# Patient Record
Sex: Male | Born: 1950 | Race: Black or African American | Hispanic: No | Marital: Married | Smoking: Former smoker
Health system: Southern US, Community
[De-identification: ages and names within clinical notes are randomized; demographics above are authoritative.]

## PROBLEM LIST (undated history)

## (undated) DIAGNOSIS — N400 Enlarged prostate without lower urinary tract symptoms: Secondary | ICD-10-CM

## (undated) DIAGNOSIS — E785 Hyperlipidemia, unspecified: Secondary | ICD-10-CM

## (undated) DIAGNOSIS — T148XXA Other injury of unspecified body region, initial encounter: Secondary | ICD-10-CM

## (undated) DIAGNOSIS — Z9989 Dependence on other enabling machines and devices: Secondary | ICD-10-CM

## (undated) DIAGNOSIS — Z860100 Personal history of colon polyps, unspecified: Secondary | ICD-10-CM

## (undated) DIAGNOSIS — I10 Essential (primary) hypertension: Secondary | ICD-10-CM

## (undated) DIAGNOSIS — G4730 Sleep apnea, unspecified: Secondary | ICD-10-CM

## (undated) DIAGNOSIS — K5790 Diverticulosis of intestine, part unspecified, without perforation or abscess without bleeding: Secondary | ICD-10-CM

## (undated) DIAGNOSIS — R519 Headache, unspecified: Secondary | ICD-10-CM

## (undated) DIAGNOSIS — R51 Headache: Secondary | ICD-10-CM

## (undated) DIAGNOSIS — H269 Unspecified cataract: Secondary | ICD-10-CM

## (undated) DIAGNOSIS — I2510 Atherosclerotic heart disease of native coronary artery without angina pectoris: Secondary | ICD-10-CM

## (undated) DIAGNOSIS — I714 Abdominal aortic aneurysm, without rupture, unspecified: Secondary | ICD-10-CM

## (undated) DIAGNOSIS — M2540 Effusion, unspecified joint: Secondary | ICD-10-CM

## (undated) DIAGNOSIS — K219 Gastro-esophageal reflux disease without esophagitis: Secondary | ICD-10-CM

## (undated) DIAGNOSIS — Z87442 Personal history of urinary calculi: Secondary | ICD-10-CM

## (undated) DIAGNOSIS — D693 Immune thrombocytopenic purpura: Secondary | ICD-10-CM

## (undated) DIAGNOSIS — I7789 Other specified disorders of arteries and arterioles: Secondary | ICD-10-CM

## (undated) DIAGNOSIS — M1990 Unspecified osteoarthritis, unspecified site: Secondary | ICD-10-CM

## (undated) DIAGNOSIS — D649 Anemia, unspecified: Secondary | ICD-10-CM

## (undated) DIAGNOSIS — J382 Nodules of vocal cords: Secondary | ICD-10-CM

## (undated) DIAGNOSIS — H409 Unspecified glaucoma: Secondary | ICD-10-CM

## (undated) DIAGNOSIS — R351 Nocturia: Secondary | ICD-10-CM

## (undated) DIAGNOSIS — G4733 Obstructive sleep apnea (adult) (pediatric): Secondary | ICD-10-CM

## (undated) DIAGNOSIS — M2550 Pain in unspecified joint: Secondary | ICD-10-CM

## (undated) DIAGNOSIS — I6523 Occlusion and stenosis of bilateral carotid arteries: Secondary | ICD-10-CM

## (undated) DIAGNOSIS — D759 Disease of blood and blood-forming organs, unspecified: Secondary | ICD-10-CM

## (undated) HISTORY — DX: Obstructive sleep apnea (adult) (pediatric): G47.33

## (undated) HISTORY — DX: Occlusion and stenosis of bilateral carotid arteries: I65.23

## (undated) HISTORY — PX: COLONOSCOPY: SHX174

## (undated) HISTORY — PX: COLONOSCOPY WITH ESOPHAGOGASTRODUODENOSCOPY (EGD): SHX5779

## (undated) HISTORY — DX: Anemia, unspecified: D64.9

## (undated) HISTORY — PX: EYE SURGERY: SHX253

## (undated) HISTORY — DX: Immune thrombocytopenic purpura: D69.3

## (undated) HISTORY — DX: Dependence on other enabling machines and devices: Z99.89

## (undated) HISTORY — DX: Essential (primary) hypertension: I10

## (undated) HISTORY — DX: Abdominal aortic aneurysm, without rupture, unspecified: I71.40

## (undated) HISTORY — DX: Abdominal aortic aneurysm, without rupture: I71.4

---

## 2003-03-06 ENCOUNTER — Emergency Department (HOSPITAL_COMMUNITY): Admission: EM | Admit: 2003-03-06 | Discharge: 2003-03-06 | Payer: Self-pay | Admitting: Family Medicine

## 2003-05-03 ENCOUNTER — Emergency Department (HOSPITAL_COMMUNITY): Admission: EM | Admit: 2003-05-03 | Discharge: 2003-05-03 | Payer: Self-pay | Admitting: Family Medicine

## 2004-02-21 ENCOUNTER — Emergency Department (HOSPITAL_COMMUNITY): Admission: EM | Admit: 2004-02-21 | Discharge: 2004-02-21 | Payer: Self-pay | Admitting: Family Medicine

## 2004-07-20 ENCOUNTER — Emergency Department (HOSPITAL_COMMUNITY): Admission: AD | Admit: 2004-07-20 | Discharge: 2004-07-20 | Payer: Self-pay | Admitting: Family Medicine

## 2004-07-21 ENCOUNTER — Emergency Department (HOSPITAL_COMMUNITY): Admission: EM | Admit: 2004-07-21 | Discharge: 2004-07-21 | Payer: Self-pay | Admitting: Family Medicine

## 2004-07-23 ENCOUNTER — Emergency Department (HOSPITAL_COMMUNITY): Admission: EM | Admit: 2004-07-23 | Discharge: 2004-07-23 | Payer: Self-pay | Admitting: Family Medicine

## 2004-07-27 ENCOUNTER — Emergency Department (HOSPITAL_COMMUNITY): Admission: EM | Admit: 2004-07-27 | Discharge: 2004-07-27 | Payer: Self-pay | Admitting: Emergency Medicine

## 2005-06-09 ENCOUNTER — Emergency Department (HOSPITAL_COMMUNITY): Admission: EM | Admit: 2005-06-09 | Discharge: 2005-06-09 | Payer: Self-pay | Admitting: Family Medicine

## 2005-07-24 ENCOUNTER — Emergency Department (HOSPITAL_COMMUNITY): Admission: EM | Admit: 2005-07-24 | Discharge: 2005-07-24 | Payer: Self-pay | Admitting: Emergency Medicine

## 2005-12-11 ENCOUNTER — Inpatient Hospital Stay (HOSPITAL_COMMUNITY): Admission: EM | Admit: 2005-12-11 | Discharge: 2005-12-14 | Payer: Self-pay | Admitting: Family Medicine

## 2006-04-08 ENCOUNTER — Emergency Department (HOSPITAL_COMMUNITY): Admission: EM | Admit: 2006-04-08 | Discharge: 2006-04-08 | Payer: Self-pay | Admitting: Emergency Medicine

## 2008-05-29 ENCOUNTER — Emergency Department (HOSPITAL_COMMUNITY): Admission: EM | Admit: 2008-05-29 | Discharge: 2008-05-29 | Payer: Self-pay | Admitting: Family Medicine

## 2009-06-04 ENCOUNTER — Emergency Department (HOSPITAL_COMMUNITY): Admission: EM | Admit: 2009-06-04 | Discharge: 2009-06-04 | Payer: Self-pay | Admitting: Family Medicine

## 2009-06-08 ENCOUNTER — Emergency Department (HOSPITAL_COMMUNITY): Admission: EM | Admit: 2009-06-08 | Discharge: 2009-06-08 | Payer: Self-pay | Admitting: Emergency Medicine

## 2010-04-15 LAB — DIFFERENTIAL
Basophils Relative: 0 % (ref 0–1)
Eosinophils Absolute: 0 10*3/uL (ref 0.0–0.7)
Eosinophils Absolute: 0.1 10*3/uL (ref 0.0–0.7)
Eosinophils Relative: 0 % (ref 0–5)
Eosinophils Relative: 1 % (ref 0–5)
Lymphocytes Relative: 8 % — ABNORMAL LOW (ref 12–46)
Lymphs Abs: 0.9 10*3/uL (ref 0.7–4.0)
Monocytes Absolute: 0.4 10*3/uL (ref 0.1–1.0)
Monocytes Relative: 11 % (ref 3–12)
Monocytes Relative: 4 % (ref 3–12)
Neutrophils Relative %: 80 % — ABNORMAL HIGH (ref 43–77)
Neutrophils Relative %: 94 % — ABNORMAL HIGH (ref 43–77)

## 2010-04-15 LAB — CBC
HCT: 40.7 % (ref 39.0–52.0)
Hemoglobin: 14.2 g/dL (ref 13.0–17.0)
MCHC: 34.8 g/dL (ref 30.0–36.0)
MCHC: 35 g/dL (ref 30.0–36.0)
MCV: 91.7 fL (ref 78.0–100.0)
MCV: 92.5 fL (ref 78.0–100.0)
RBC: 4.4 MIL/uL (ref 4.22–5.81)
RDW: 14.5 % (ref 11.5–15.5)

## 2010-04-15 LAB — URINALYSIS, ROUTINE W REFLEX MICROSCOPIC
Glucose, UA: NEGATIVE mg/dL
Ketones, ur: NEGATIVE mg/dL
Leukocytes, UA: NEGATIVE
Nitrite: NEGATIVE
Specific Gravity, Urine: 1.01 (ref 1.005–1.030)
pH: 5.5 (ref 5.0–8.0)

## 2010-04-15 LAB — COMPREHENSIVE METABOLIC PANEL
ALT: 37 U/L (ref 0–53)
AST: 34 U/L (ref 0–37)
Calcium: 9.2 mg/dL (ref 8.4–10.5)
Creatinine, Ser: 1.25 mg/dL (ref 0.4–1.5)
GFR calc Af Amer: >60 mL/min (ref >60)
Sodium: 133 mEq/L — ABNORMAL LOW (ref 135–145)
Total Protein: 6.8 g/dL (ref 6.0–8.3)

## 2010-04-15 LAB — LIPASE, BLOOD: Lipase: 33 U/L (ref 11–59)

## 2010-04-15 LAB — POCT I-STAT, CHEM 8
BUN: 12 mg/dL (ref 6–23)
Chloride: 97 mEq/L (ref 96–112)
Creatinine, Ser: 0.8 mg/dL (ref 0.4–1.5)
Sodium: 136 mEq/L (ref 135–145)
TCO2: 29 mmol/L (ref 0–100)

## 2010-04-15 LAB — POCT URINALYSIS DIP (DEVICE)
Ketones, ur: 40 mg/dL — AB
Protein, ur: 100 mg/dL — AB
Specific Gravity, Urine: 1.02 (ref 1.005–1.030)

## 2010-04-15 LAB — URINE CULTURE: Colony Count: NO GROWTH

## 2010-04-27 ENCOUNTER — Emergency Department (HOSPITAL_COMMUNITY): Payer: BC Managed Care – PPO

## 2010-04-27 ENCOUNTER — Inpatient Hospital Stay (INDEPENDENT_AMBULATORY_CARE_PROVIDER_SITE_OTHER)
Admission: RE | Admit: 2010-04-27 | Discharge: 2010-04-27 | Disposition: A | Discharge status: Home / Self Care | Payer: BC Managed Care – PPO | Source: Ambulatory Visit | Attending: Family Medicine | Admitting: Family Medicine

## 2010-04-27 ENCOUNTER — Encounter (HOSPITAL_COMMUNITY): Payer: Self-pay | Admitting: Radiology

## 2010-04-27 ENCOUNTER — Observation Stay (HOSPITAL_COMMUNITY)
Admission: EM | Admit: 2010-04-27 | Discharge: 2010-04-28 | Disposition: A | Discharge status: Home / Self Care | Payer: BC Managed Care – PPO | Attending: Cardiology | Admitting: Cardiology

## 2010-04-27 DIAGNOSIS — K8020 Calculus of gallbladder without cholecystitis without obstruction: Secondary | ICD-10-CM | POA: Insufficient documentation

## 2010-04-27 DIAGNOSIS — K7689 Other specified diseases of liver: Secondary | ICD-10-CM | POA: Insufficient documentation

## 2010-04-27 DIAGNOSIS — I10 Essential (primary) hypertension: Secondary | ICD-10-CM | POA: Insufficient documentation

## 2010-04-27 DIAGNOSIS — R079 Chest pain, unspecified: Secondary | ICD-10-CM

## 2010-04-27 DIAGNOSIS — R112 Nausea with vomiting, unspecified: Secondary | ICD-10-CM | POA: Insufficient documentation

## 2010-04-27 DIAGNOSIS — Q619 Cystic kidney disease, unspecified: Secondary | ICD-10-CM | POA: Insufficient documentation

## 2010-04-27 DIAGNOSIS — E876 Hypokalemia: Secondary | ICD-10-CM | POA: Insufficient documentation

## 2010-04-27 DIAGNOSIS — R61 Generalized hyperhidrosis: Secondary | ICD-10-CM | POA: Insufficient documentation

## 2010-04-27 DIAGNOSIS — F17200 Nicotine dependence, unspecified, uncomplicated: Secondary | ICD-10-CM | POA: Insufficient documentation

## 2010-04-27 DIAGNOSIS — I495 Sick sinus syndrome: Secondary | ICD-10-CM

## 2010-04-27 LAB — POCT CARDIAC MARKERS: Troponin i, poc: <0.05 ng/mL (ref 0.00–0.09)

## 2010-04-27 LAB — BASIC METABOLIC PANEL
BUN: 10 mg/dL (ref 6–23)
CO2: 23 mEq/L (ref 19–32)
Chloride: 97 mEq/L (ref 96–112)
Creatinine, Ser: 0.87 mg/dL (ref 0.4–1.5)
Glucose, Bld: 126 mg/dL — ABNORMAL HIGH (ref 70–99)
Potassium: 3.3 mEq/L — ABNORMAL LOW (ref 3.5–5.1)

## 2010-04-27 LAB — DIFFERENTIAL
Basophils Relative: 0 % (ref 0–1)
Eosinophils Absolute: 0 10*3/uL (ref 0.0–0.7)
Eosinophils Relative: 0 % (ref 0–5)
Lymphs Abs: 0.8 10*3/uL (ref 0.7–4.0)
Monocytes Absolute: 0.4 10*3/uL (ref 0.1–1.0)
Monocytes Relative: 4 % (ref 3–12)
Neutrophils Relative %: 90 % — ABNORMAL HIGH (ref 43–77)

## 2010-04-27 LAB — CBC
MCH: 30.7 pg (ref 26.0–34.0)
MCHC: 34.5 g/dL (ref 30.0–36.0)
MCV: 89 fL (ref 78.0–100.0)
Platelets: 97 10*3/uL — ABNORMAL LOW (ref 150–400)
RDW: 14.1 % (ref 11.5–15.5)

## 2010-04-27 MED ORDER — IOHEXOL 300 MG/ML  SOLN
100.0000 mL | Freq: Once | INTRAMUSCULAR | Status: AC | PRN
  Administered 2010-04-27: 100 mL via INTRAVENOUS

## 2010-04-28 ENCOUNTER — Emergency Department (HOSPITAL_COMMUNITY): Payer: BC Managed Care – PPO

## 2010-04-28 LAB — POCT CARDIAC MARKERS
CKMB, poc: <1 ng/mL — ABNORMAL LOW (ref 1.0–8.0)
Myoglobin, poc: 82.1 ng/mL (ref 12–200)
Troponin i, poc: <0.05 ng/mL (ref 0.00–0.09)

## 2010-04-28 LAB — COMPREHENSIVE METABOLIC PANEL
ALT: 13 U/L (ref 0–53)
Albumin: 4.6 g/dL (ref 3.5–5.2)
Alkaline Phosphatase: 45 U/L (ref 39–117)
BUN: 9 mg/dL (ref 6–23)
Potassium: 3.1 mEq/L — ABNORMAL LOW (ref 3.5–5.1)
Sodium: 131 mEq/L — ABNORMAL LOW (ref 135–145)
Total Protein: 7.9 g/dL (ref 6.0–8.3)

## 2010-04-28 LAB — LIPID PANEL
Cholesterol: 187 mg/dL (ref 0–200)
HDL: 54 mg/dL (ref >39)
Total CHOL/HDL Ratio: 3.5 RATIO

## 2010-04-28 LAB — URINALYSIS, ROUTINE W REFLEX MICROSCOPIC
Glucose, UA: NEGATIVE mg/dL
Ketones, ur: 15 mg/dL — AB
Leukocytes, UA: NEGATIVE
pH: 7.5 (ref 5.0–8.0)

## 2010-04-28 LAB — URINE MICROSCOPIC-ADD ON

## 2010-04-28 LAB — TSH: TSH: 1.611 u[IU]/mL (ref 0.350–4.500)

## 2010-04-28 LAB — CBC
HCT: 39.3 % (ref 39.0–52.0)
Hemoglobin: 13.6 g/dL (ref 13.0–17.0)
MCH: 30.4 pg (ref 26.0–34.0)
MCV: 87.7 fL (ref 78.0–100.0)
RBC: 4.48 MIL/uL (ref 4.22–5.81)

## 2010-04-28 LAB — CARDIAC PANEL(CRET KIN+CKTOT+MB+TROPI)
CK, MB: 1.5 ng/mL (ref 0.3–4.0)
Relative Index: 0.8 (ref 0.0–2.5)
Total CK: 204 U/L (ref 7–232)
Troponin I: 0.01 ng/mL (ref 0.00–0.06)

## 2010-05-03 NOTE — Discharge Summary (Signed)
  NAME:  Lonnie Hansen, Lonnie Hansen NO.:  [id number]  MEDICAL RECORD NO.:  [id number]           PATIENT TYPE:  I  LOCATION:  2033                         FACILITY:  [facility]  PHYSICIAN:  Mali Othniel Maret, MD         DATE OF BIRTH:  22-Aug-1950  DATE OF ADMISSION:  04/27/2010 DATE OF DISCHARGE:  04/28/2010                              DISCHARGE SUMMARY   DISCHARGE DIAGNOSES: 1. Chest pain, MI ruled out. 2. Treated hypertension. 3. Hypokalemia, repleted. 4. Questionable viral gastroenteritis.  HOSPITAL COURSE:  The patient is a pleasant 60 year old male, he is a friend of Dr. Melvern Banker.  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  He has no prior history of coronary disease.  He developed some nausea and vomiting and chest pressure after eating a large meal.  He presented to the emergency room.  Please see admission history and physical for complete details. His EKG showed no acute changes.  He was admitted to telemetry for observation.  His enzymes were negative for an MI.  Abdominal ultrasound was done showing cholelithiasis and renal cysts.  There was some fatty infiltration of the liver as well.  There was no gallbladder wall thickening or edema.  CT angiogram of his chest showed no evidence of pulmonary embolism.  Chest x-ray showed no evidence of disease.  TSH 1.61.  Cholesterol 187, HDL 54, LDL 119.  Troponin's negative x3. Amylase 92, hemoglobin A1c 5.6.  Lipase was slightly elevated at 73. Sodium 131, potassium 3.1, BUN 9, creatinine 0.79.  Liver functions were normal.  White count 10.7, hemoglobin 13.6, hematocrit 39.3, platelets 115,000.  EKG shows sinus rhythm, sinus bradycardia with no acute changes.  DISPOSITION:  The patient was seen by Dr. Debara Pickett.  He feels it is possible the patient had viral gastroenteritis and mild dehydration. There was no evidence of cholecystitis.  We did replace his potassium which was low at 3.1.  Dr. Debara Pickett feels he can be discharged late on the second and  we will see him back in the office in 1-2 weeks.  We did add Protonix and gave him a prescription for nitrates.  He is not on a beta- blocker because of his baseline bradycardia.     Erlene Quan, P.A.   ______________________________ Mali Doren Kaspar, MD    LKK/MEDQ  D:  04/28/2010  T:  04/29/2010  Job:  [id number]  cc:   Mali Loretha Ure, MD  Electronically Signed by Kerin Ransom P.A. on 05/01/2010 10:15:01 AM Electronically Signed by Raliegh Ip. Gabrelle Roca M.D. on 05/03/2010 11:53:49 AM

## 2010-05-29 NOTE — H&P (Signed)
NAME:  [name], [name] NO.:  [id number]  MEDICAL RECORD NO.:  [id number]           PATIENT TYPE:  E  LOCATION:  [facility]                         FACILITY:  [facility]  PHYSICIAN:  Rolland Porter, MD DATE OF BIRTH:  April 22, 1950  DATE OF ADMISSION:  04/27/2010 DATE OF DISCHARGE:                             HISTORY & PHYSICAL   CHIEF COMPLAINT:  Chest pain.  HISTORY OF PRESENT ILLNESS:  [name] is a pleasant 60 year old African American male who presents to the emergency department tonight with complaints of substernal chest discomfort radiating to his back with associated nausea and vomiting.  His symptoms started around noontime on April 27, 2010.  He describes this substernal discomfort as a burning sensation and initially felt that it was related to reflux.  He does have reflux occasionally; however, this was more uncomfortable.  He complains of associated nausea and vomiting with this and complains of diaphoresis.  Also, he used some antacids at home with very little relief.  He was also treated with sublingual nitroglycerin in the emergency department, which relieved his pain; however, he states he was nearly pain-free prior to taking the nitro.  He denies any exertional chest discomfort.  No orthopnea or PND.  No lower extremity edema.  He denies feeling any tachycardia or palpitations and has not experienced any syncope or presyncope.  At present, he complains of nausea but denies any chest discomfort.  On arrival to the emergency department, his EKG revealed normal sinus rhythm with no acute ST-T wave changes. He was hypertensive with a blood pressure of 166/98 and his cardiac point-of-care markers were negative.  He does report feeling weak and feels like he has no energy.  He has no previous cardiac history.  PAST MEDICAL HISTORY: 1. History of GI bleed. 2. History of diverticulosis and hemorrhoids. 3. Questionable irritable bowel syndrome. 4. Migraine  headaches. 5. History of kidney stones. 6. Left renal cyst. 7. Glaucoma. 8. Tobacco use. 9. Scoliosis. 10.Hypertension. 11.Questionable lipid status.  FAMILY HISTORY:  Positive for coronary artery disease in his father.  He died of a heart attack at age 77.  SOCIAL HISTORY:  He is married.  He smokes 2-3 cigarettes a day and drinks occasionally.  He is a Therapist, nutritional.  ALLERGIES:  None known.  CURRENT MEDICATIONS: 1. Atenolol 100 mg daily. 2. Tribenzor, dosages are unknown.  REVIEW OF SYSTEMS:  As per HPI, otherwise negative.  PHYSICAL EXAMINATION:  VITAL SIGNS: Blood pressure is 160/90, pulse is 50, respirations 20, pulse ox is 95%, Temperature is 98.6. GENERAL:  This is a pleasant 60 year old white male, in no acute distress. HEENT:  Normocephalic, atraumatic.  Pupils equal, reactive to light and accommodation.  Extraocular movements intact.  Sclerae are white. NECK:  Supple.  No lymphadenopathy, thyromegaly, or bruits.  No JVD. CARDIOVASCULAR:  Regular rate and rhythm.  S1, S2 without appreciable murmur, gallop, or rub. LUNGS:  Clear to auscultation bilaterally with normal respiratory effort. ABDOMEN:  Soft.  Mild tenderness in the epigastric region.  No hepatosplenomegaly or masses. EXTREMITIES:  Radial, femoral, dorsal pedal arteries are 2+ bilaterally. There is no  lower extremity edema.  No clubbing, cyanosis, or ulcers. NEUROLOGIC:  Oriented to person, place, and time.  Normal mood and affect.  Cranial nerves II-XII are grossly intact. SKIN:  Warm and moist.  LABORATORY DATA:  Chest x-ray is negative for acute process.  BMET revealed a sodium of 129, potassium is 3.3, glucose of 126, BUN 10 and creatinine 0.87.  White blood cell count is 12.1, hemoglobin is 12.6, hematocrit 36.5 and platelets of 97,000.  Myoglobin is 67.5, CK-MB is less than 1.0.  Troponin is less than 0.05.  IMPRESSION: 1. Chest pain. 2. Vomiting. 3. Hypertension. 4. History of reflux. 5.  History of irritable bowel syndrome. 6. Diverticulosis. 7. Unknown lipid status. 8. Family history of coronary artery disease.  PLAN:  We will admit to telemetry and place on nitropaste.  We will continue to rule him out for myocardial infarction.  He is to continue with beta blockers.  We will continue with an ARB as well as Norvasc and he has a p.r.n. order for hydralazine for his blood pressure if his systolic pressure remains greater than 160.  We will check a BNP, a TSH, and a magnesium level.  We will replete K and obtain an echocardiogram in the morning.  We would like to obtain an ultrasound of the abdomen to rule out gallbladder disease given his symptoms.  He will be given Zofran for his nausea.  Further recommendations are pending.    ______________________________ Blair Dolphin, NP   ______________________________ Rolland Porter, MD    LS/MEDQ  D:  04/28/2010  T:  04/28/2010  Job:  [id number]  cc:   Dr. Terance Hart  Electronically Signed by Bari Mantis NP on 05/29/2010 05:25:14 PM Electronically Signed by Glenetta Hew MD on 05/29/2010 07:37:10 PM

## 2010-06-13 NOTE — Discharge Summary (Signed)
NAME:  Lonnie Hansen, Lonnie Hansen NO.:  [id number]   MEDICAL RECORD NO.:  [id number]          PATIENT TYPE:  INP   LOCATION:  [id number]                         FACILITY:  [facility]   PHYSICIAN:  Sherryl Manges, M.D.  DATE OF BIRTH:  March 01, 1950   DATE OF ADMISSION:  12/10/2005  DATE OF DISCHARGE:  12/14/2005                                 DISCHARGE SUMMARY   PRIMARY MEDICAL DOCTOR:  Dr. Beaulah Corin, [facility], [place],  [place] ([place] near [facility]).   DISCHARGE DIAGNOSES:  1. Lower gastrointestinal bleed/hematochezia secondary to      diverticulosis/hemorrhoids.  2. Anemia, secondary to acute on chronic gastrointestinal blood loss.  3. Acute diverticulitis.  4. Smoking history.  5. History of hypertension.  6. History of urolithiasis.  7. History of complex left renal cyst, initially diagnosed 1999.   DISCHARGE MEDICATIONS:  1. Norvasc 10 mg p.o. daily.  2. Atenolol 50 mg p.o. daily (was on 100 mg p.o. daily).  3. Protonix 40 mg p.o. daily.  4. Flagyl 500 mg p.o. t.i.d. until December 20, 2005.  5. Ciprofloxacin 500 mg p.o. b.i.d. until December 20, 2005.  6. Nu-Iron 150 mg p.o. daily.  7. Miralax OTC 17 g p.o. p.r.n. daily.   PROCEDURES:  1. Abdominal/pelvic CT scan dated December 11, 2005.  This showed complex      lesion in the left kidney which may represent a complex cyst, but      cannot be definitely characterized.  MRI recommended for further      evaluation.  Gallstone without acute cholecystitis.  Also, extensive      sigmoid diverticular disease with diverticula scattered throughout      colon. Near the splenic flexure, there is infiltration of omental fat      about the colon, consistent with diverticulitis.  No perforation or      abscess.  No pelvic lymphadenopathy or focal fluid collection.  2. MRI abdomen dated December 13, 2005.  This showed a Bosniak 63F complex      renal cystic lesion on the left, with some  thin septations as well as      some coarse calcification within it, with no appreciable enhancement of      CT scan or MRI scan.  This is most likely benign, but the coarse      calcifications are certainly atypical.  This classification of renal      mass, usually requires follow up in 3-6 months.  MRI would be most      sensitive with and without contrast to determine any enhancement of      this complex lesion.  No other significant abnormality.  Incidental      note is made of a single gallstone.  There is extensive diverticulosis      of the left side of the colon and what appears to be a hemangioma in      the T8 vertebral body.  Degenerative disk disease is seen throughout      the lower lumbar spine.   CONSULTATIONS:  Dr. Watt Climes, gastroenterologist.  ADMISSION HISTORY:  As in H&P notes of December 11, 2005, dictated by Dr.  Rexene Alberts.  However, in brief, this is a 60 year old male, with known  history of migraine headaches, possible irritable bowel syndrome,  urolithiasis, and left renal cyst, scoliosis, glaucoma, tobacco use,  diverticulosis, and hemorrhoids confirmed by lower GI endoscopy  approximately 3-4 years ago, hypertension, now presenting with a 2 day  history of recurrent hematochezia associated with back tenderness.  He was  admitted for further evaluation, investigation, and management.   CLINICAL COURSE:  1. Lower gastrointestinal bleed.  The patient presents with hematochezia      which has been recurrent over the 2 days prior to admission, with      associated lightheadedness.  Reportedly, 3-4 years ago, the patient      underwent lower GI endoscopy in [place], [place], and this      confirmed diverticulosis and hemorrhoids.  On initial evaluation,      hemoglobin was found to be 12.1, MCV 93.  He was hemodynamically stable      with a blood pressure of 142/98 and pulse of 74.  The patient was      managed initially with bowel rest, intravenous fluid  hydration, proton      pump inhibitor treatment, abdominal CT scan was ordered.  This      confirmed sigmoid diverticulosis with associated acute diverticulitis.      GI consultation was called, which was kindly provided by Dr. Watt Climes, who      recommended a conservative approach.  Serial hemoglobin levels were      done, and these remained stable, with hemoglobin ranging between 9.5-      11.4.  The patient had no further recurrences of hematochezia during      the course of this hospitalization.  GI has recommended antibiotic      treatment for the patient's diverticulitis and plans to defer lower GI      endoscopy until diverticulitis has been completely treated, after which      outpatient lower GI endoscopy will be arranged.   1. Anemia.  This is likely secondary to acute on chronic GI blood loss.      For details of presentation, refer to #1 above.  The patient's      hemoglobin remained stable at 9.5-11.4 throughout the course of his      hospitalization.  He has been commenced on iron supplements.   1. Acute diverticulitis.  For details of presentation, refer to #1 above.      GI has recommended a 10 day course of Flagyl/Ciprofloxacin and, as of      December 14, 2005, the patient was on day 4 of antibiotic treatment.      He is expected to complete this on December 20, 2005.   1. Hypertension.  The patient remained normotensive, throughout his      hospitalization, on a combination on calcium channel blocker and beta      blocker.  On December 14, 2005, his pulse rate was noted to be somewhat      low, at 59.  His atenolol has therefore been reduced to 50 mg p.o.      daily.  We expect the patient's primary MD to continue to monitor his      blood pressure and adjust antihypertensive medications as indicated.   1. Smoking history.  The patient has been counseled adequately for this.   1. History of urolithiasis.  There were no symptoms comparable to this,     throughout the  course of the patient's hospitalization.   1. Left renal cyst.  Initial abdominal imaging, i.e., with CT scan,      demonstrated a complex left renal cyst.  MRI was recommended for      further elucidation of anatomy.  On detailed discussion with the      patient, it appears that this was initially diagnosed in 1999.  The      patient was actually consulted at [facility] at the      time with regards to this lesion.  Given the elapsed time, it is likely      that this is a benign lesion; however, MRI of the abdomen was done on      December 13, 2005.  For details of findings, refer to procedure list      above.  It turns out to be Bosniak 20F complex left renal cyst with      calcification.  It is felt to be likely benign; however, follow up MRI      with and without contrast in 3-6 months is recommended.  The patient      has been reassured accordingly.  We expect the patient's primary MD to      continue to follow up on this.   DISPOSITION:  The patient was considered sufficiently clinically recovered,  stable, and asymptomatic to be discharged on December 14, 2005.   DIET:  High fiber diet.   ACTIVITY:  As tolerated.   WOUND CARE:  Not applicable.   PAIN MANAGEMENT:  Not applicable.   FOLLOW UP INSTRUCTIONS:  The patient is instructed to follow up with his  primary MD, Dr. Beaulah Corin, at [facility], [place], [place], within 1-2 weeks.  The patient has been instructed to call for an  appointment.  In addition, he is to follow up with Dr. Watt Climes, [name],  telephone number [phone] in 1 week's time, for outpatient follow up and  outpatient colonoscopy.   SPECIAL INSTRUCTIONS:  The patient is recommended to have follow up MRI of  left kidney lesion in 3-6 months at the discretion of his primary MD.      Sherryl Manges, M.D.  Electronically Signed     CO/MEDQ  D:  12/14/2005  T:  12/14/2005  Job:  [id number]   cc:   Jeryl Columbia,  M.D.  [place], [place] Beaulah Corin, MD, [facility]

## 2010-06-13 NOTE — Consult Note (Signed)
NAME:  Lonnie Hansen, Lonnie Hansen NO.:  [id number]   MEDICAL RECORD NO.:  [id number]          PATIENT TYPE:  INP   LOCATION:  4715                         FACILITY:  [facility]   PHYSICIAN:  Jeryl Columbia, M.D.    DATE OF BIRTH:  1950-10-04   DATE OF CONSULTATION:  DATE OF DISCHARGE:                                   CONSULTATION   REASON FOR CONSULTATION:  We were asked to do a consultation on Lonnie Hansen  by Dr. Thereasa Solo today for lower GI bleed.   HISTORY OF PRESENT ILLNESS:  This is a 60 year old male with a history of  diverticulitis with recent occurrences in both May and June of 2007.  He  started having black bowel movements with bright red blood yesterday, and  approximately 11 a.m.  The bowel movements continued until 6 p.m. when they  ceased, but Lonnie Hansen went ahead to the ER room.  He reports that yesterday  in the late afternoon, he developed a hot flash, then chills, then he  vomited.  He also reports he has had many years of heartburn controlled with  Prilosec over the counter.  He had his first colonoscopy 12 years ago by Dr.  Patrice Paradise who found diverticulitis.  He had a flexible sigmoidoscopy 3-4 years  ago with Dr. __________  .  He is positive for weakness, heartburn, melena,  hematochezia.  Positive for left lower quadrant tenderness.  Positive for  NSAIDs.  He said he uses two [employer] Powders per day for his arthritis.  Positive for headache, positive for hemorrhoids.   PAST MEDICAL HISTORY:  He has irritable bowel syndrome.  He fluctuates  between constipation and diarrhea.  He has arthritis, hypertension and a  history of MRSA.   REVIEW OF SYSTEMS:  Positive for headaches.  No weight loss, no recent  illness.   SOCIAL HISTORY:  Positive for tobacco.  He says he quit today.  He smokes 3-  4 cigarettes a day.  Positive for alcohol.  He used to drink heavily, but  now drinks only occasionally.  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  He  lives at home with his wife and no children.   FAMILY HISTORY:  Positive for a paternal gentleman with colon cancer, and a  cousin with Crohn's disease.   CURRENT MEDICATIONS:  Include Septra, atenolol and Norvasc.   ALLERGIES:  NO KNOWN DRUG ALLERGIES.   CURRENT LABS:  Showed hemoglobin of 10.1 which dropped from 12.1 yesterday.  Current hematocrit is 29.2 which has dropped from 35.2.  He was guaiac  positive on 11/15.   PHYSICAL EXAMINATION:  He was alert and oriented, in no apparent distress.  His cardiovascular system had a regular rate and rhythm.  His lungs are  clear to auscultation bilaterally.  His abdomen is soft, nontender,  nondistended with positive bowel sounds.  His extremities showed no edema.  He has not seen any signs of frank blood since last night, and no bowel  movements today.   ASSESSMENT/PLAN:  The patient was seen and examined by  Dr. Jeryl Columbia who  believes this is very likely a diverticular bleed in a 59 year old male with  previous recent history of diverticulitis, possibly and an upper GI bleed as  well, considering NSAIDs, heartburn and melena.  We will plan for  colonoscopy with possible endoscopy Saturday morning.      Melton Alar, PA    ______________________________  Jeryl Columbia, M.D.    MLY/MEDQ  D:  12/11/2005  T:  12/12/2005  Job:  [id number]   cc:   Jeryl Columbia, M.D.  Cherene Altes, M.D.

## 2010-06-13 NOTE — H&P (Signed)
NAME:  Lonnie Hansen, Lonnie Hansen NO.:  [id number]   MEDICAL RECORD NO.:  [id number]          PATIENT TYPE:  INP   LOCATION:  [id number]                         FACILITY:  [facility]   PHYSICIAN:  Rexene Alberts, M.D.    DATE OF BIRTH:  November 10, 1950   DATE OF ADMISSION:  12/11/2005  DATE OF DISCHARGE:                                HISTORY & PHYSICAL   PRIMARY CARE PHYSICIAN:  The patient is unassigned.   CHIEF COMPLAINT:  Multiple bloody stools.   HISTORY OF PRESENT ILLNESS:  The patient is a 60 year old man with a past  medical history significant for hypertension, diverticulosis, and  hemorrhoids, who presents to the emergency department with a chief complaint  of multiple bloody stools.  The patient states that he had a small bloody  stool 2 days ago.  This was followed by multiple bloody stools yesterday.  With 1 of his bowel movements, he says that his stool was black just once.  The other bowel movements were discolored with bright red blood.  Initially,  he had no complaints of abdominal pain; however, late last night he began to  have a small amount of crampy abdominal pain in his lower abdomen.  The pain  has been intermittent and not constant.  He has also had associated nausea  and vomiting once.  He became diaphoretic on 1 occasion.  He has had  lightheadedness for the past several hours.  He did, however, drive himself  to the [facility].  At that time, apparently a rectal exam was  performed; it was grossly bloody per his account.  The patient was advised  to  come to the emergency department at [facility].  When he  arrived, he was noted to be hemodynamically stable.  His hemoglobin is  currently 12.1.  However, given his symptomatology, he will be admitted for  further evaluation and management.   PAST MEDICAL HISTORY:  1. History of hematochezia in the past.  The patient underwent a      sigmoidoscopy or a colonoscopy approximately 3-4 years ago  by his      former physician, Dr. Elgie Congo, in [place], [place].  The patient was      told that he had diverticulosis and hemorrhoids.  2. ?History of irritable bowel syndrome.  3. Migraine headaches.  4. Kidney stone.  5. Left renal cyst.  6. History of Staph infections of the left arm and right arm in the past.      The patient is treated with Bactrim DS daily for chronic preventative      therapy.  The patient was never hospitalized for a MRSA infection.  7. Scoliosis.  8. Glaucoma.  9. Tobacco use.   MEDICATIONS:  1. Bactrim DS one tablet daily.  2. Amlodipine 10 mg daily.  3. Atenolol 100 mg daily.   ALLERGIES:  No known drug allergies.   SOCIAL HISTORY:  The patient is married.  He recently moved from [place],  [place].  He now lives in [place].  He smokes 2-3 cigarettes  per day.  He drinks alcohol only on occasion.  He denies illicit drug use.  He is a Therapist, nutritional.   FAMILY HISTORY:  His mother died of lymphoma.  His father died of a heart  attack at 46 years of age.   REVIEW OF SYSTEMS:  The patient's review of systems is otherwise negative.   PHYSICAL EXAMINATION:  VITAL SIGNS:  Temperature 97, blood pressure 142/98,  pulse 74, respiratory rate 22, oxygen saturation 100% on room air.  GENERAL:  The patient is a pleasant 60 year old [place] man who is  currently lying in bed in no acute distress.  HEENT:  Head is normocephalic and nontraumatic.  Pupils equal, round and  reactive to light.  Extraocular movements are intact.  Conjunctivae are  clear.  Sclerae are white.  Tympanic membranes are obscured by cerumen  bilaterally.  Nasal mucosa is mildly dry.  No sinus tenderness.  Oropharynx  reveals fair dentition.  Mucous membranes are dry.  No posterior exudates or  erythema.  NECK:  Supple with no adenopathy, no thyromegaly, no bruit, no JVD.  LUNGS:  Clear to auscultation bilaterally.  HEART:  S1, S2.  No murmurs, rubs, or gallops.  ABDOMEN:   Positive bowel sounds.  Mildly obese, soft, mildly tender over the  left lower quadrant.  No rebound, no guarding, no distention, no masses  palpated.  No hepatosplenomegaly.  RECTAL:  Deferred.  Per the emergency department physician, his stool was  grossly bloody and guaiac-positive.  EXTREMITIES:  Pedal pulses are 2+ bilaterally.  No pretibial edema and no  pedal edema.  NEUROLOGIC:  The patient is alert and oriented x3.  Cranial nerves II-XII  are intact.  Strength is 5/5 throughout.  Sensation is intact.   ADMISSION LABORATORY DATA:  EKG pending.   WBC 10.3, hemoglobin 12.1, MCV 93, platelets 159,000.  PT 14.5, INR 1.1, PTT  25.  Sodium 137, potassium 4.3, chloride 105, CO2 28, glucose 114, BUN 13,  creatinine 0.9, calcium 8.8, total protein 7.1, albumin 4.1, AST 24, ALT 16,  alkaline phosphatase 43, total bilirubin 0.4.   ASSESSMENT:  1. Hematochezia/probable lower gastrointestinal bleed.  The patient has a      history of hemorrhoids and diverticulosis.  More than likely, these are      the sources of his bleeding.  The patient, however, describes 1 black      stool.  The patient may have an addition of an upper gastrointestinal      source as well.  2. Anemia.  More than likely, the patient's anemia is secondary to acute      and chronic gastrointestinal bleeding.  3. Hypertension.  The patient's blood pressure is only mildly elevated.  4. Tobacco use.  5. History of a left forearm staphylococcal infection diagnosed in August      2007.  The patient was told by his primary care physician to continue      to take Bactrim DS daily for several months.  On exam, there are no      signs consistent with an active infection.   PLAN:  1. The patient will be admitted further evaluation and management.  2. He will be typed and screened for 2 units of packed red blood cells.      His hemoglobin and hematocrit will be monitored closely.  3. Volume repletion with normal saline. 4.  The patient will be allowed only sips of clear liquids and ice chips      for now.  5. We will consult the gastroenterologist.  6. The nursing staff will be asked to record all of the patient's bloody      stools.  7. We will start Protonix 40 mg q.12 h. x48 hours and then transition the      Protonix to oral.  8. We will hold Norvasc and decrease the dose of atenolol to 25 mg b.i.d.      If the patient's blood pressure becomes uncontrolled, certainly the      atenolol can be titrated back up and amlodipine can be restarted.      Rexene Alberts, M.D.  Electronically Signed     DF/MEDQ  D:  12/11/2005  T:  12/11/2005  Job:  [id number]

## 2014-05-31 ENCOUNTER — Institutional Professional Consult (permissible substitution): Payer: BLUE CROSS/BLUE SHIELD | Admitting: Neurology

## 2014-11-06 DIAGNOSIS — H2513 Age-related nuclear cataract, bilateral: Secondary | ICD-10-CM | POA: Diagnosis not present

## 2014-11-06 DIAGNOSIS — H25013 Cortical age-related cataract, bilateral: Secondary | ICD-10-CM | POA: Diagnosis not present

## 2014-11-06 DIAGNOSIS — H35033 Hypertensive retinopathy, bilateral: Secondary | ICD-10-CM | POA: Diagnosis not present

## 2014-11-06 DIAGNOSIS — H401231 Low-tension glaucoma, bilateral, mild stage: Secondary | ICD-10-CM | POA: Diagnosis not present

## 2014-12-06 ENCOUNTER — Encounter: Payer: Self-pay | Admitting: Cardiology

## 2014-12-06 DIAGNOSIS — E785 Hyperlipidemia, unspecified: Secondary | ICD-10-CM | POA: Diagnosis not present

## 2014-12-06 DIAGNOSIS — Z23 Encounter for immunization: Secondary | ICD-10-CM | POA: Diagnosis not present

## 2014-12-06 DIAGNOSIS — I10 Essential (primary) hypertension: Secondary | ICD-10-CM | POA: Diagnosis not present

## 2014-12-06 DIAGNOSIS — Z125 Encounter for screening for malignant neoplasm of prostate: Secondary | ICD-10-CM | POA: Diagnosis not present

## 2014-12-06 DIAGNOSIS — R8299 Other abnormal findings in urine: Secondary | ICD-10-CM | POA: Diagnosis not present

## 2014-12-13 DIAGNOSIS — M25551 Pain in right hip: Secondary | ICD-10-CM | POA: Diagnosis not present

## 2014-12-13 DIAGNOSIS — E785 Hyperlipidemia, unspecified: Secondary | ICD-10-CM | POA: Diagnosis not present

## 2014-12-13 DIAGNOSIS — Z6831 Body mass index (BMI) 31.0-31.9, adult: Secondary | ICD-10-CM | POA: Diagnosis not present

## 2014-12-13 DIAGNOSIS — M545 Low back pain: Secondary | ICD-10-CM | POA: Diagnosis not present

## 2014-12-13 DIAGNOSIS — Z0000 Encounter for general adult medical examination without abnormal findings: Secondary | ICD-10-CM | POA: Diagnosis not present

## 2014-12-13 DIAGNOSIS — R490 Dysphonia: Secondary | ICD-10-CM | POA: Diagnosis not present

## 2014-12-13 DIAGNOSIS — I10 Essential (primary) hypertension: Secondary | ICD-10-CM | POA: Diagnosis not present

## 2014-12-13 DIAGNOSIS — Z1389 Encounter for screening for other disorder: Secondary | ICD-10-CM | POA: Diagnosis not present

## 2014-12-13 DIAGNOSIS — R3129 Other microscopic hematuria: Secondary | ICD-10-CM | POA: Diagnosis not present

## 2014-12-18 DIAGNOSIS — Z1212 Encounter for screening for malignant neoplasm of rectum: Secondary | ICD-10-CM | POA: Diagnosis not present

## 2014-12-19 DIAGNOSIS — M545 Low back pain: Secondary | ICD-10-CM | POA: Diagnosis not present

## 2014-12-19 DIAGNOSIS — M25551 Pain in right hip: Secondary | ICD-10-CM | POA: Diagnosis not present

## 2014-12-31 DIAGNOSIS — M25551 Pain in right hip: Secondary | ICD-10-CM | POA: Diagnosis not present

## 2015-02-01 DIAGNOSIS — Z6831 Body mass index (BMI) 31.0-31.9, adult: Secondary | ICD-10-CM | POA: Diagnosis not present

## 2015-02-01 DIAGNOSIS — R5383 Other fatigue: Secondary | ICD-10-CM | POA: Diagnosis not present

## 2015-02-01 DIAGNOSIS — R05 Cough: Secondary | ICD-10-CM | POA: Diagnosis not present

## 2015-02-01 DIAGNOSIS — J019 Acute sinusitis, unspecified: Secondary | ICD-10-CM | POA: Diagnosis not present

## 2015-02-06 DIAGNOSIS — M25551 Pain in right hip: Secondary | ICD-10-CM | POA: Diagnosis not present

## 2015-02-22 DIAGNOSIS — R490 Dysphonia: Secondary | ICD-10-CM | POA: Diagnosis not present

## 2015-02-22 DIAGNOSIS — J387 Other diseases of larynx: Secondary | ICD-10-CM | POA: Diagnosis not present

## 2015-05-14 DIAGNOSIS — H524 Presbyopia: Secondary | ICD-10-CM | POA: Diagnosis not present

## 2015-05-14 DIAGNOSIS — H401231 Low-tension glaucoma, bilateral, mild stage: Secondary | ICD-10-CM | POA: Diagnosis not present

## 2015-05-14 DIAGNOSIS — H04123 Dry eye syndrome of bilateral lacrimal glands: Secondary | ICD-10-CM | POA: Diagnosis not present

## 2015-05-14 DIAGNOSIS — H1013 Acute atopic conjunctivitis, bilateral: Secondary | ICD-10-CM | POA: Diagnosis not present

## 2015-07-22 DIAGNOSIS — R05 Cough: Secondary | ICD-10-CM | POA: Diagnosis not present

## 2015-07-22 DIAGNOSIS — M25551 Pain in right hip: Secondary | ICD-10-CM | POA: Diagnosis not present

## 2015-07-22 DIAGNOSIS — G4733 Obstructive sleep apnea (adult) (pediatric): Secondary | ICD-10-CM | POA: Diagnosis not present

## 2015-07-22 DIAGNOSIS — Z6830 Body mass index (BMI) 30.0-30.9, adult: Secondary | ICD-10-CM | POA: Diagnosis not present

## 2015-07-22 DIAGNOSIS — R918 Other nonspecific abnormal finding of lung field: Secondary | ICD-10-CM | POA: Diagnosis not present

## 2015-07-22 DIAGNOSIS — I10 Essential (primary) hypertension: Secondary | ICD-10-CM | POA: Diagnosis not present

## 2015-07-22 DIAGNOSIS — Z0000 Encounter for general adult medical examination without abnormal findings: Secondary | ICD-10-CM | POA: Diagnosis not present

## 2015-07-23 ENCOUNTER — Other Ambulatory Visit: Payer: Self-pay | Admitting: Internal Medicine

## 2015-07-23 DIAGNOSIS — R918 Other nonspecific abnormal finding of lung field: Secondary | ICD-10-CM

## 2015-07-24 ENCOUNTER — Ambulatory Visit
Admission: RE | Admit: 2015-07-24 | Discharge: 2015-07-24 | Disposition: A | Discharge status: Home / Self Care | Payer: Medicare Other | Source: Ambulatory Visit | Attending: Internal Medicine | Admitting: Internal Medicine

## 2015-07-24 DIAGNOSIS — R918 Other nonspecific abnormal finding of lung field: Secondary | ICD-10-CM

## 2015-07-24 MED ORDER — IOPAMIDOL (ISOVUE-300) INJECTION 61%
75.0000 mL | Freq: Once | INTRAVENOUS | Status: AC | PRN
  Administered 2015-07-24: 75 mL via INTRAVENOUS

## 2015-08-11 DIAGNOSIS — G4733 Obstructive sleep apnea (adult) (pediatric): Secondary | ICD-10-CM | POA: Diagnosis not present

## 2015-08-13 DIAGNOSIS — G4733 Obstructive sleep apnea (adult) (pediatric): Secondary | ICD-10-CM | POA: Diagnosis not present

## 2015-08-14 ENCOUNTER — Encounter: Payer: Self-pay | Admitting: Cardiothoracic Surgery

## 2015-08-14 ENCOUNTER — Institutional Professional Consult (permissible substitution) (INDEPENDENT_AMBULATORY_CARE_PROVIDER_SITE_OTHER): Payer: Medicare Other | Admitting: Cardiothoracic Surgery

## 2015-08-14 VITALS — BP 147/83 | HR 63 | Resp 16 | Ht 69.0 in | Wt 206.0 lb

## 2015-08-14 DIAGNOSIS — I712 Thoracic aortic aneurysm, without rupture, unspecified: Secondary | ICD-10-CM

## 2015-08-14 NOTE — Patient Instructions (Addendum)
It's best to avoid activities that cause grunting or straining (medically referred to as a "valsalva maneuver"). This happens when a person bears down against a closed throat to increase the strength of arm or abdominal muscles. There's often a tendency to do this when lifting heavy weights, doing sit-ups, push-ups or chin-ups, etc., but it may be harmful.     Thoracic Aortic Aneurysm An aneurysm is a bulge in an artery. It happens when the wall of the artery is weakened or damaged. If the aneurysm gets too big, it bursts (ruptures) and severe bleeding occurs. A thoracic aortic aneurysm is an aneurysm that occurs in the first part of the aorta, between the heart and the diaphragm. The aorta is the main artery and supplies blood from the heart to the rest of the body. A thoracic aortic aneurysm can enlarge and rupture or blood can flow between the layers of the wall of the aorta through a tear (aorticdissection). Both of these conditions can cause bleeding inside the body and can be life threatening unless diagnosed and treated promptly. CAUSES  The exact cause of a thoracic aortic aneurysm is often unknown. Some contributing factors are:   A hardening of the arteries caused by the buildup of fat and other substances in the lining of a blood vessel (arteriosclerosis).  Inflammation of the walls of an artery (arteritis).  Connective tissue diseases, such as Marfan syndrome.  Injury or trauma to the aorta.  An infection, such as syphilis or staphylococcus, in the wall of the aorta (infectious aortitis) caused by bacteria. RISK FACTORS  Risk factors that contribute to a thoracic aortic aneurysm may include:  Age older than 80 years.  High blood pressure (hypertension).  Male gender.  Ethnicity (white race).  Obesity.  Family history of aneurysm (first degree relatives only).  Tobacco use. PREVENTION  The following healthy lifestyle habits may help decrease your risk of a thoracic  aortic aneurysm:  Quitting smoking. Smoking can raise your blood pressure and cause arteriosclerosis.  Limiting or avoiding alcohol.  Keeping your blood pressure, blood sugar level, and cholesterol levels within normal limits.  Decreasing your salt intake. In some people, too much salt can raise blood pressure and increase your risk of abdominal aortic aneurysm.  Eating a diet low in saturated fats and cholesterol.  Increasing your fiber intake by including whole grains, vegetables, and fruits in your diet. Eating these foods may help lower blood pressure.  Maintaining a healthy weight.  Staying physically active and exercising regularly. SYMPTOMS  The symptoms of thoracic aortic aneurysm may vary depending on the size and rate of growth of the aneurysm. Most grow slowly and do not have any symptoms. When symptoms do occur, they may include:  Pain (chest, back, sides, or abdomen). The pain may vary in intensity. A sudden onset of severe pain may indicate that the aneurysm has ruptured.  Hoarseness.  Cough.  Shortness of breath.  Swallowing problems.  Nausea or vomiting or both. DIAGNOSIS  Since most unruptured thoracic aortic aneurysms have no symptoms, they are often discovered during diagnostic exams for other conditions. An aneurysm may be found during the following procedures:  Ultrasonography (a one-time screening for thoracic aortic aneurysm by ultrasonography is also recommended for all men aged 14-75 years who have ever smoked).  X-ray exams.  A CT scan.  An MRI.  Angiography or arteriography. TREATMENT  Treatment of a thoracic aortic aneurysm depends on the size of your aneurysm, your age, and risk factors for  rupture. Medicine to control blood pressure and pain may be used to manage aneurysms smaller than 2.3 in (6 cm). Regular monitoring for enlargement may be recommended by your health care provider if:  The aneurysm is 1.2-1.5 in (3-4 cm) in size (an annual  ultrasonography may be recommended).  The aneurysm is 1.5-1.8 in (4-4.5 cm) in size (an ultrasonography every 6 months may be recommended).  The aneurysm is larger than 1.8 in (4.5 cm) in size (your health care provider may ask that you be examined by a vascular surgeon). If your aneurysm is larger than 2.2 in (5.5 cm) or if it is enlarging quickly, surgical repair may be recommended. There are two main methods for repair of an aneurysm:   Endovascular repair (a minimally invasive surgery).  Open repair. This method is used if an endovascular repair is not possible.   This information is not intended to replace advice given to you by your health care provider. Make sure you discuss any questions you have with your health care provider.   Document Released: 01/12/2005 Document Revised: 11/02/2012 Document Reviewed: 07/25/2012 Elsevier Interactive Patient Education [employer].

## 2015-08-14 NOTE — Progress Notes (Addendum)
[address]       [place] [address]             [phone]                    Shirley C Jonas [place] Medical Record [id number] Date of Birth: 11-08-1950  Referring:PATERSON,DANIEL G, MD Primary Care: [name], MD  Chief Complaint:    Chief Complaint  Patient presents with  . TAA    eval and treat...CT CHEST 07/24/15    History of Present Illness:    Lonnie Hansen 65 y.o. male is seen in the office  today for Dilated ascending aorta. The patient complains of increasing right hip pain making it difficult for him to be active. He was seeing Dr. Ninfa Linden for consideration of right hip replacement. In preparation for this a chest x-ray was performed leading to a CT scan of the chest. The patient has a previous CT scan of the chest done during admission in 2012 for syncope.  Currently he denies any cardiac symptoms denies chest pain exertional resting shortness of breath though he does note his activity is limited due to his right hip pain other than episode of syncope in 2012 he's had no repeat syncopal episodes, notes rare palpitations. He has no previous history of myocardial infarction.   His family history is significant for his father who had had a MI prior but "drop dead at home" at age 73 in 83. His mother died of lymphoma date 82 1 brother and 1 sister alive his brothers had a myocardial infarction in the past., He has no children. There is no other history can on Cerone calls her cousins of unexplained sudden death at a young age.   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       Current Activity/ Functional Status:  Patient is independent with mobility/ambulation, transfers, ADL's, IADL's.  But does use a cane because of pain in the right hip  Zubrod Score: At the time of surgery this patient's most appropriate activity status/level should be  described as: []     0    Normal activity, no symptoms [x]     1    Restricted in physical strenuous activity but ambulatory, able to do out light work []     2    Ambulatory and capable of self care, unable to do work activities, up and about               >50 % of waking hours                              []     3    Only limited self care, in bed greater than 50% of waking hours []     4    Completely disabled, no self care, confined to bed or chair []     5    Moribund   Past Medical History  Diagnosis Date  . GI bleed   . Hypertension   . HLD (hyperlipidemia)   . Anemia   . Forgetfulness   . Insomnia   . OA (osteoarthritis) of hip     History reviewed. No pertinent past surgical history.  Family History  Problem Relation Age of Onset  . Heart disease Father   .  Heart attack Father   . Thyroid disease Sister   As noted above his father died suddenly at age 61, he had a previous history of myocardial infarction   Social History   Social History  . Marital Status: Married    Spouse Name: N/A  . Number of Children: N/A  . Years of Education: N/A   Occupational History  . Not on file.   Social History Main Topics  . Smoking status: Former Smoker -- 1.00 packs/day for 25 years    Types: Cigarettes  . Smokeless tobacco: Not on file  . Alcohol Use: No  . Drug Use: No  . Sexual Activity: Not on file   Other Topics Concern  . Not on file   Social History Narrative   Married.   He is a Optometrist.       History  Smoking status  . Former Smoker -- 1.00 packs/day for 25 years  . Types: Cigarettes  Smokeless tobacco  . Not on file    History  Alcohol Use No     No Known Allergies  Current Outpatient Prescriptions  Medication Sig Dispense Refill  . amLODipine (NORVASC) 10 MG tablet Take 10 mg by mouth daily.    Marland Kitchen atenolol (TENORMIN) 25 MG tablet Take 12.5 mg by mouth daily.     . bimatoprost (LUMIGAN) 0.01 % SOLN Place 1 drop into both eyes at bedtime.      Marland Kitchen HYDROcodone-homatropine (HYCODAN) 5-1.5 MG/5ML syrup Take 5 mLs by mouth every 4 (four) hours as needed for cough.    . pantoprazole (PROTONIX) 40 MG tablet Take 40 mg by mouth daily.     No current facility-administered medications for this visit.     Review of Systems:     Cardiac Review of Systems: Y or N  Chest Pain [  n  ]  Resting SOB [  n ] Exertional SOB  [n  ]  Orthopnea [ n ]   Pedal Edema [   ]    Palpitations [  ] Syncope  [ once 2012 ]   Presyncope [ n  ]  General Review of Systems: [Y] = yes [  ]=no Constitional: recent weight change [ gain ];  Wt loss over the last 3 months [   ] anorexia [  ]; fatigue [ y ]; nausea [ n ]; night sweats [ n ]; fever [n]; or chills [  n];          Dental: poor dentition[  n]; Last Dentist visit:   Eye : blurred vision [  ]; diplopia [   ]; vision changes [  ];  Amaurosis fugax[  ]; Resp: cough [n  ];  wheezing[ n ];  hemoptysis[ n ]; shortness of breath[  ]; paroxysmal nocturnal dyspnea[  ]; dyspnea on exertion[  ]; or orthopnea[  ];  GI:  gallstones[  ], vomiting[  ];  dysphagia[  ]; melena[  ];  hematochezia [  ]; heartburn[  ];   Hx of  Colonoscopy[ y last year ]; GU: kidney stones [  ]; hematuria[  ];   dysuria [  ];  nocturia[  ];  history of     obstruction [  ]; urinary frequency [  ]             Skin: rash, swelling[  ];, hair loss[  ];  peripheral edema[  ];  or itching[  ]; Musculosketetal: myalgias[  ];  joint swelling[  ];  joint erythema[  ];  joint pain[  ];  back pain[  ];  Heme/Lymph: bruising[  ];  bleeding[  ];  anemia[  ];  Neuro: TIA[  ];  headaches[  ];  stroke[  ];  vertigo[  ];  seizures[ n ];   paresthesias[  ];  difficulty walking[ y ];  Psych:depression[  ]; anxiety[  ];  Endocrine: diabetes[ n ];  thyroid dysfunction[n ];  Immunizations: Flu up to date [ y]; Pneumococcal up to date [ n ];  Other:  Physical Exam: BP 147/83 mmHg  Pulse 63  Resp 16  Ht 5\' 9"  (1.753 m)  Wt 206 lb (93.441 kg)  BMI 30.41 kg/m2   SpO2 95%  PHYSICAL EXAMINATION: General appearance: alert, cooperative and appears older than stated age Head: Normocephalic, without obvious abnormality, atraumatic Neck: no adenopathy, no carotid bruit, no JVD, supple, symmetrical, trachea midline and thyroid not enlarged, symmetric, no tenderness/mass/nodules Lymph nodes: Cervical, supraclavicular, and axillary nodes normal. Resp: clear to auscultation bilaterally Back: symmetric, no curvature. ROM normal. No CVA tenderness. Cardio: regular rate and rhythm, S1, S2 normal, no murmur, click, rub or gallop GI: soft, non-tender; bowel sounds normal; no masses,  no organomegaly Extremities: extremities normal, atraumatic, no cyanosis or edema and Homans sign is negative, no sign of DVT Neurologic: Grossly normal Palpable dp and pt pulses bilaterial  Diagnostic Studies & Laboratory data:     Recent Radiology Findings:   Ct Chest W Contrast  07/24/2015  CLINICAL DATA:  Ectatic thoracic aorta on recent chest radiograph EXAM: CT CHEST WITH CONTRAST TECHNIQUE: Multidetector CT imaging of the chest was performed during intravenous contrast administration. CONTRAST:  21mL ISOVUE-300 IOPAMIDOL (ISOVUE-300) INJECTION 61% COMPARISON:  Most recent available chest CT April 27, 2010 FINDINGS: Mediastinum/Lymph Nodes: There is dilatation of the ascending thoracic aorta. Just beyond the aortic valve, the ascending thoracic aorta measures 6.1 x 4.9 cm, stable from prior study. At the level of the proximal right main pulmonary artery, the thoracic aorta has a maximum transverse diameter of 4.6 x 4.1 cm, essentially stable from prior study. At the level of the aortic arch, the transverse diameter is 4.6 cm, stable. There is no thoracic aortic dissection. Visualized great vessels appear somewhat tortuous but otherwise unremarkable. There is no major vessel pulmonary embolus. There are multiple foci of coronary artery calcification. There is no appreciable pericardial  thickening. Thyroid appears unremarkable. There is no appreciable thoracic adenopathy. Lungs/Pleura: There is slight scarring in the lung bases. There is no parenchymal lung edema or consolidation. Upper abdomen: In the visualized upper abdomen, there is cholelithiasis. Gallbladder wall does not appear appreciably thickened. There is a complex cyst in the upper left kidney containing calcification, unchanged compared to prior study. There is a parapelvic cyst on the right, incompletely visualized, measuring 4.2 x 3.5 cm. Musculoskeletal: There are no blastic or lytic bone lesions. IMPRESSION: Thoracic aortic prominence as noted above. Prominence is greatest just beyond the aortic valve. Question a degree of aortic valve disease. Recommend semi-annual imaging followup by CTA or MRA and referral to cardiothoracic surgery if not already obtained. This recommendation follows 2010 ACCF/AHA/AATS/ACR/ASA/SCA/SCAI/SIR/STS/SVM Guidelines for the Diagnosis and Management of Patients With Thoracic Aortic Disease. Circulation. 2010; 121: e266-e369TAA. Note that this aortic prominence is essentially stable compared to 2012 study. There is no periaortic fluid. No thoracic aortic dissection noted. No pericardial effusion. There are multiple areas of coronary artery calcification. No parenchymal lung edema or consolidation. No parenchymal lung mass or adenopathy. Cholelithiasis.  Stable cystic lesion containing multiple calcifications in the upper pole left kidney. Parapelvic cyst right kidney. Electronically Signed   By: Lowella Grip III M.D.   On: 07/24/2015 11:00    I have independently reviewed the above radiology studies  and reviewed the findings with the patient.    Recent Lab Findings: Lab Results  Component Value Date   WBC 10.7* 04/28/2010   HGB 13.6 04/28/2010   HCT 39.3 04/28/2010   PLT 115* 04/28/2010   GLUCOSE 119* 04/28/2010   CHOL  04/28/2010    187        ATP III CLASSIFICATION:  <200     mg/dL    Desirable  200-239  mg/dL   Borderline High  >=240    mg/dL   High          TRIG 69 04/28/2010   HDL 54 04/28/2010   LDLCALC * 04/28/2010    119        Total Cholesterol/HDL:CHD Risk Coronary Heart Disease Risk Table                     Men   Women  1/2 Average Risk   3.4   3.3  Average Risk       5.0   4.4  2 X Average Risk   9.6   7.1  3 X Average Risk  23.4   11.0        Use the calculated Patient Ratio above and the CHD Risk Table to determine the patient's CHD Risk.        ATP III CLASSIFICATION (LDL):  <100     mg/dL   Optimal  100-129  mg/dL   Near or Above                    Optimal  130-159  mg/dL   Borderline  160-189  mg/dL   High  >190     mg/dL   Very High   ALT 13 04/28/2010   AST 24 04/28/2010   NA 131* 04/28/2010   K 3.1* 04/28/2010   CL 92* 04/28/2010   CREATININE 0.79 04/28/2010   BUN 9 04/28/2010   CO2 24 04/28/2010   TSH 1.611 04/28/2010   HGBA1C  04/28/2010    5.6 (NOTE)                                                                       According to the ADA Clinical Practice Recommendations for 2011, when HbA1c is used as a screening test:   >=6.5%   Diagnostic of Diabetes Mellitus           (if abnormal result  is confirmed)  5.7-6.4%   Increased risk of developing Diabetes Mellitus  References:Diagnosis and Classification of Diabetes Mellitus,Diabetes [id number] 1):S62-S69 and Standards of Medical Care in         Diabetes - 2011,Diabetes Care,2011,34  (Suppl 1):S11-S61.    Aortic Size Index=      5.0   /Body surface area is 2.13 meters squared. = 2.34  < 2.75 cm/m2      4% risk per year 2.75 to 4.25          8% risk per  year > 4.25 cm/m2    20% risk per year    Assessment / Plan:   Dilated ascending aorta and aortic root, without murmur of aortic insufficiency. Likely stable since 2012 Or slightly larger. The dilatation at the aortic root makes the actual measurement difficult to make. I reviewed with the patient the radiographic  findings of dilated aortic root and descending aorta. I discussed with him the disease process and the risk of aortic dissection.  He needs 1 / diligent blood pressure control including a beta blocker , 2/ echocardiogram to evaluate aortic valve, 3/ reestablish cardiology care is previously seen Dr. Debara Pickett. 4/ gated CT scan of the aortic root.   I'll plan to see him back after the above is done to make further recommendations concerning the need for surgical repair versus continued observation, though it does appear from comparing the scan in 2012 there is been little change likely less than 1/2  cm over 5 years. He would need cardiac catheterization before any planned surgical repair.     I  spent 40 minutes counseling the patient face to face and 50% or more the  time was spent in counseling and coordination of care. The total time spent in the appointment was 60 minutes.  Grace Isaac MD      [address] [place] [address] Office [phone]   Beeper [phone]  08/14/2015 7:55 PM

## 2015-08-15 ENCOUNTER — Other Ambulatory Visit: Payer: Self-pay | Admitting: *Deleted

## 2015-08-15 DIAGNOSIS — H43393 Other vitreous opacities, bilateral: Secondary | ICD-10-CM | POA: Diagnosis not present

## 2015-08-15 DIAGNOSIS — H3563 Retinal hemorrhage, bilateral: Secondary | ICD-10-CM | POA: Diagnosis not present

## 2015-08-15 DIAGNOSIS — I351 Nonrheumatic aortic (valve) insufficiency: Secondary | ICD-10-CM

## 2015-08-15 DIAGNOSIS — I7121 Aneurysm of the ascending aorta, without rupture: Secondary | ICD-10-CM

## 2015-08-15 DIAGNOSIS — I712 Thoracic aortic aneurysm, without rupture: Secondary | ICD-10-CM

## 2015-08-15 DIAGNOSIS — H35033 Hypertensive retinopathy, bilateral: Secondary | ICD-10-CM | POA: Diagnosis not present

## 2015-08-15 DIAGNOSIS — H43812 Vitreous degeneration, left eye: Secondary | ICD-10-CM | POA: Diagnosis not present

## 2015-08-20 ENCOUNTER — Other Ambulatory Visit: Payer: Self-pay | Admitting: *Deleted

## 2015-08-20 DIAGNOSIS — I2541 Coronary artery aneurysm: Secondary | ICD-10-CM

## 2015-08-20 DIAGNOSIS — I77810 Thoracic aortic ectasia: Secondary | ICD-10-CM

## 2015-08-27 DIAGNOSIS — I6523 Occlusion and stenosis of bilateral carotid arteries: Secondary | ICD-10-CM

## 2015-08-27 HISTORY — DX: Occlusion and stenosis of bilateral carotid arteries: I65.23

## 2015-08-28 ENCOUNTER — Encounter (HOSPITAL_COMMUNITY): Payer: Self-pay

## 2015-08-28 ENCOUNTER — Ambulatory Visit (HOSPITAL_COMMUNITY)
Admission: RE | Admit: 2015-08-28 | Discharge: 2015-08-28 | Disposition: A | Discharge status: Home / Self Care | Payer: Medicare Other | Source: Ambulatory Visit | Attending: Cardiothoracic Surgery | Admitting: Cardiothoracic Surgery

## 2015-08-28 ENCOUNTER — Encounter (HOSPITAL_COMMUNITY): Payer: Medicare Other

## 2015-08-28 ENCOUNTER — Other Ambulatory Visit (HOSPITAL_COMMUNITY): Payer: Self-pay

## 2015-08-28 DIAGNOSIS — I2541 Coronary artery aneurysm: Secondary | ICD-10-CM

## 2015-08-28 DIAGNOSIS — I712 Thoracic aortic aneurysm, without rupture: Secondary | ICD-10-CM | POA: Insufficient documentation

## 2015-08-28 DIAGNOSIS — I77810 Thoracic aortic ectasia: Secondary | ICD-10-CM | POA: Diagnosis not present

## 2015-08-28 LAB — POCT I-STAT CREATININE: Creatinine, Ser: 0.9 mg/dL (ref 0.61–1.24)

## 2015-08-28 MED ORDER — IOPAMIDOL (ISOVUE-370) INJECTION 76%
INTRAVENOUS | Status: AC
  Administered 2015-08-28: 80 mL
  Filled 2015-08-28: qty 100

## 2015-09-04 ENCOUNTER — Encounter: Payer: Self-pay | Admitting: Cardiology

## 2015-09-09 DIAGNOSIS — H43393 Other vitreous opacities, bilateral: Secondary | ICD-10-CM | POA: Diagnosis not present

## 2015-09-09 DIAGNOSIS — H43812 Vitreous degeneration, left eye: Secondary | ICD-10-CM | POA: Diagnosis not present

## 2015-09-09 DIAGNOSIS — H3563 Retinal hemorrhage, bilateral: Secondary | ICD-10-CM | POA: Diagnosis not present

## 2015-09-09 DIAGNOSIS — H35033 Hypertensive retinopathy, bilateral: Secondary | ICD-10-CM | POA: Diagnosis not present

## 2015-09-13 ENCOUNTER — Telehealth: Payer: Self-pay | Admitting: Cardiology

## 2015-09-13 NOTE — Telephone Encounter (Signed)
Patient has never been seen by Dr. Radford Pax. Took patient info to scheduler to make appointment with first available physician.

## 2015-09-13 NOTE — Telephone Encounter (Signed)
Patient is now scheduled 8/23 with Dr. Martinique.

## 2015-09-13 NOTE — Telephone Encounter (Signed)
New message     Pt was sent a reschedule letter and the next available time for reschedule was in Oct 17. Pt insisted he needs an earlier appt. He is a new pt to Dr. Radford Pax. Please call.

## 2015-09-17 ENCOUNTER — Encounter: Payer: Self-pay | Admitting: Cardiology

## 2015-09-18 ENCOUNTER — Encounter (INDEPENDENT_AMBULATORY_CARE_PROVIDER_SITE_OTHER): Payer: Self-pay

## 2015-09-18 ENCOUNTER — Other Ambulatory Visit: Payer: Self-pay | Admitting: Cardiology

## 2015-09-18 ENCOUNTER — Ambulatory Visit (INDEPENDENT_AMBULATORY_CARE_PROVIDER_SITE_OTHER): Payer: Medicare Other | Admitting: Cardiology

## 2015-09-18 ENCOUNTER — Encounter: Payer: Self-pay | Admitting: Cardiology

## 2015-09-18 DIAGNOSIS — I10 Essential (primary) hypertension: Secondary | ICD-10-CM | POA: Insufficient documentation

## 2015-09-18 DIAGNOSIS — I712 Thoracic aortic aneurysm, without rupture, unspecified: Secondary | ICD-10-CM | POA: Insufficient documentation

## 2015-09-18 DIAGNOSIS — E785 Hyperlipidemia, unspecified: Secondary | ICD-10-CM | POA: Insufficient documentation

## 2015-09-18 DIAGNOSIS — H34219 Partial retinal artery occlusion, unspecified eye: Secondary | ICD-10-CM

## 2015-09-18 NOTE — Patient Instructions (Signed)
We will call with the results of your Echocardiogram  We will schedule you for carotid dopplers.

## 2015-09-18 NOTE — Progress Notes (Signed)
Cardiology Office Note    Date:  09/18/2015   ID:  Lonnie Hansen, Lonnie Hansen Aug 22, 1950, MRN [id number]  PCP:  [name], MD  Cardiologist:  Malli Falotico Martinique, MD    History of Present Illness:  Lonnie Hansen is a 65 y.o. male seen at the request of Dr. Lanelle Bal for evaluation of Dilated thoracic aorta and potential AV disease. He has a history of HLD and HTN. In 2012 he was admitted with a syncopal spell. He ruled out for MI. He was hypokalemic. He had a follow up stress test as an outpatient with Dr. Debara Pickett that was apparently OK. More recently he was planning to have hip replacement. CXR showed aortic enlargement. He then underwent CT that showed dilation of the proximal aorta. He has been seen by Dr. Servando Snare. CT of the aorta showed mild aortic root enlargement with maximal diameter 4.5 cm. The descending aorta was normal. He denies any chest pain or SOB. He has occasional pains in his left neck. He was seen by opthalmology and told he had some blood in the back of his eye and they were concerned about a vascular problem.  He is limited in activity due to hip pain. He wants to get on with his hip surgery. He has chronic glaucoma. 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 No dizziness, palpitations, or syncope since 2012. He is a Therapist, nutritional.    Past Medical History:  Diagnosis Date  . Anemia   . Forgetfulness   . GI bleed   . HLD (hyperlipidemia)   . Hypertension   . Insomnia   . OA (osteoarthritis) of hip     History reviewed. No pertinent surgical history.  Current Medications: Outpatient Medications Prior to Visit  Medication Sig Dispense Refill  . amLODipine (NORVASC) 10 MG tablet Take 10 mg by mouth daily.    . Aspirin-Acetaminophen (GOODY BODY PAIN) 500-325 MG PACK Take by mouth 2 (two) times daily as needed (for hip pain).    Marland Kitchen atenolol (TENORMIN) 25 MG tablet Take 12.5 mg by mouth daily.     .  bimatoprost (LUMIGAN) 0.01 % SOLN Place 1 drop into both eyes at bedtime.    Marland Kitchen HYDROcodone-homatropine (HYCODAN) 5-1.5 MG/5ML syrup Take 5 mLs by mouth every 4 (four) hours as needed for cough.    . pantoprazole (PROTONIX) 40 MG tablet Take 40 mg by mouth daily.     No facility-administered medications prior to visit.      Allergies:   Review of patient's allergies indicates no known allergies.   Social History   Social History  . Marital status: Married    Spouse name: N/A  . Number of children: N/A  . Years of education: N/A   Social History Main Topics  . Smoking status: Former Smoker    Packs/day: 1.00    Years: 25.00    Types: Cigarettes  . Smokeless tobacco: Never Used  . Alcohol use No  . Drug use: No  . Sexual activity: Not Asked   Other Topics Concern  . None   Social History Narrative   Married.   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     Family History:  The patient's family history includes Heart attack in his father; Heart disease in his father; Thyroid disease in his sister.   ROS:   Please see the history of present  illness.    ROS All other systems reviewed and are negative.   PHYSICAL EXAM:   VS:  BP 136/74 (BP Location: Left Arm) Comment: 128/72 Right Arm  Pulse 61   Ht 5\' 9"  (1.753 m)   Wt 202 lb (91.6 kg)   BMI 29.83 kg/m    GEN: Well nourished, well developed, in no acute distress  HEENT: normal, voice is hoarse.  Neck: no JVD, carotid bruits, or masses Cardiac: RRR; no murmurs, rubs, or gallops,no edema  Respiratory:  clear to auscultation bilaterally, normal work of breathing GI: soft, nontender, nondistended, + BS MS: no deformity or atrophy  Skin: warm and dry, no rash Neuro:  Alert and Oriented x 3, Strength and sensation are intact Psych: euthymic mood, full affect  Wt Readings from Last 3 Encounters:  09/18/15 202 lb (91.6 kg)  08/14/15 206 lb (93.4 kg)      Studies/Labs Reviewed:   EKG:  EKG is ordered today.  The  ekg ordered today demonstrates NSR with rate 61. Normal I have personally reviewed and interpreted this study.   Recent Labs: 08/28/2015: Creatinine, Ser 0.90   Lipid Panel    Component Value Date/Time   CHOL  04/28/2010 0703    187        ATP III CLASSIFICATION:  <200     mg/dL   Desirable  200-239  mg/dL   Borderline High  >=240    mg/dL   High          TRIG 69 04/28/2010 0703   HDL 54 04/28/2010 0703   CHOLHDL 3.5 04/28/2010 0703   VLDL 14 04/28/2010 0703   LDLCALC (H) 04/28/2010 0703    119        Total Cholesterol/HDL:CHD Risk Coronary Heart Disease Risk Table                     Men   Women  1/2 Average Risk   3.4   3.3  Average Risk       5.0   4.4  2 X Average Risk   9.6   7.1  3 X Average Risk  23.4   11.0        Use the calculated Patient Ratio above and the CHD Risk Table to determine the patient's CHD Risk.        ATP III CLASSIFICATION (LDL):  <100     mg/dL   Optimal  100-129  mg/dL   Near or Above                    Optimal  130-159  mg/dL   Borderline  160-189  mg/dL   High  >190     mg/dL   Very High    Additional studies/ records that were reviewed today include:  ADDENDUM REPORT: 08/28/2015 13:09  CLINICAL DATA:  Aortic Aneurysm  EXAM: Cardiac  CT  TECHNIQUE: The patient was scanned on a Philips 256 scanner. A 120 kV retrospective scan was triggered in the descending thoracic aorta at 111 HU's. Gantry rotation speed was 270 msecs and collimation was .9 mm. No beta blockade or nitro were given. The 3D data set was reconstructed in 5% intervals of the R-R cycle. Systolic and diastolic phases were analyzed on a dedicated work station using MPR, MIP and VRT modes. The patient received 80 cc of contrast.  FINDINGS: Aortic Valve:  Trileaflet without significant calcium  Aorta: Fusiform dilatation of the ascending aorta widest  at the sinus level. Bovine Arch with no coarctation. Isolated area of calcification at the isthmus with mild  aneurysmal dilatation in this area as well  Orthogonal double oblique measurements as follows  Sinotubular Junction:  40 mm  Ascending Thoracic Aorta:  42 mm  Aortic Arch:  40 mm  Isthmus:  40 mm  Descending Thoracic Aorta:  27 mm  Sinus of Valsalva Measurements:  Non-coronary:  45 mm  Right coronary: 46 mm  Left coronary: 48 mm  : 1) Trileaflet aortic valve with no significant calcification  2) Fusiform dilatation of the aortic sinuses maximum measurement in coronal view 51 mm. However anatomic short axis  Views show largest diameter of 48 mm for left coronary sinus  3) Tortuous left subclavian vein  4) Bovine Arch  5) Mild dilatation of the aortic root, arch and isthmus  6) Normal descending thoracic aorta with no coarctation  Jenkins Rouge   Electronically Signed   By: Jenkins Rouge M.D.   On: 08/28/2015 13:09   Addended by Josue Hector, MD on 08/28/2015 1:12 PM    Study Result   EXAM: OVER-READ INTERPRETATION  CT CHEST  The following report is an over-read performed by radiologist Dr. Rebekah Chesterfield [facility] on 08/28/2015. This over-read does not include interpretation of cardiac or coronary anatomy or pathology. The coronary calcium score/coronary CTA interpretation by the cardiologist is attached.  COMPARISON:  Chest CT 07/24/2015.  FINDINGS: Aortic atherosclerosis. Mild aneurysmal dilatation of the ascending thoracic aorta and the arch, both of which measure 4.5 cm in diameter. The isthmus is also aneurysmal measuring 4.3 cm in diameter. The remainder the descending thoracic aorta is otherwise normal in caliber. Aortic root dilatation (to be described separately). No evidence of thoracic aortic dissection. Within the visualized portions of the thorax there are no suspicious appearing pulmonary nodules or masses, there is no acute consolidative airspace disease, no pleural effusions, no pneumothorax  and no lymphadenopathy. Visualized portions of the upper abdomen are unremarkable. There are no aggressive appearing lytic or blastic lesions noted in the visualized portions of the skeleton.  IMPRESSION: 1. No acute incidental noncardiac findings are noted. 2. Aneurysmal dilatation of the ascending thoracic aorta, aortic arch and isthmus, as above. No thoracic aortic dissection at this time. Recommend semi-annual imaging followup by CTA or MRA. This recommendation follows 2010 ACCF/AHA/AATS/ACR/ASA/SCA/SCAI/SIR/STS/SVM Guidelines for the Diagnosis and Management of Patients With Thoracic Aortic Disease. Circulation. 2010; [id number].  Electronically Signed: By: Vinnie Langton M.D. On: 08/28/2015 10:59          ASSESSMENT:    1. Thoracic aortic aneurysm without rupture ([place])   2. Essential hypertension   3. Hyperlipidemia      PLAN:  In order of problems listed above:  1. Recommend Echo to rule out AV disease. This is scheduled for this Friday 2. Will check carotid dopplers to rule out carotid disease given concerns noted on eye exam.  If above studies are OK he is cleared to proceed with hip replacement surgery and surgery for his vocal chord growth. Continue current antihypertensive therapy. Once hip is fixed he can hopefully participate in regular aerobic activity. I will follow up in one year.    Medication Adjustments/Labs and Tests Ordered: Current medicines are reviewed at length with the patient today.  Concerns regarding medicines are outlined above.  Medication changes, Labs and Tests ordered today are listed in the Patient Instructions below. Patient Instructions  We will call with the results of  your Echocardiogram  We will schedule you for carotid dopplers.      Signed, Lonnie Vandevelde Martinique, MD  09/18/2015 3:17 PM    [facility] [address], [place], [place], [address] [phone]

## 2015-09-20 ENCOUNTER — Ambulatory Visit: Payer: Medicare Other | Admitting: Cardiology

## 2015-09-20 ENCOUNTER — Ambulatory Visit (HOSPITAL_COMMUNITY): Payer: Medicare Other | Attending: Cardiology

## 2015-09-20 ENCOUNTER — Other Ambulatory Visit: Payer: Self-pay

## 2015-09-20 DIAGNOSIS — I351 Nonrheumatic aortic (valve) insufficiency: Secondary | ICD-10-CM | POA: Diagnosis not present

## 2015-09-20 DIAGNOSIS — I77810 Thoracic aortic ectasia: Secondary | ICD-10-CM | POA: Insufficient documentation

## 2015-09-20 DIAGNOSIS — E785 Hyperlipidemia, unspecified: Secondary | ICD-10-CM | POA: Diagnosis not present

## 2015-09-20 DIAGNOSIS — I712 Thoracic aortic aneurysm, without rupture: Secondary | ICD-10-CM | POA: Diagnosis not present

## 2015-09-20 DIAGNOSIS — I119 Hypertensive heart disease without heart failure: Secondary | ICD-10-CM | POA: Insufficient documentation

## 2015-09-20 DIAGNOSIS — I7121 Aneurysm of the ascending aorta, without rupture: Secondary | ICD-10-CM

## 2015-09-23 ENCOUNTER — Encounter: Payer: Self-pay | Admitting: Cardiology

## 2015-09-23 ENCOUNTER — Ambulatory Visit (HOSPITAL_COMMUNITY)
Admission: RE | Admit: 2015-09-23 | Discharge: 2015-09-23 | Disposition: A | Discharge status: Home / Self Care | Payer: Medicare Other | Source: Ambulatory Visit | Attending: Cardiology | Admitting: Cardiology

## 2015-09-23 ENCOUNTER — Encounter (INDEPENDENT_AMBULATORY_CARE_PROVIDER_SITE_OTHER): Payer: Self-pay

## 2015-09-23 DIAGNOSIS — I10 Essential (primary) hypertension: Secondary | ICD-10-CM | POA: Insufficient documentation

## 2015-09-23 DIAGNOSIS — I6523 Occlusion and stenosis of bilateral carotid arteries: Secondary | ICD-10-CM | POA: Diagnosis not present

## 2015-09-23 DIAGNOSIS — H3589 Other specified retinal disorders: Secondary | ICD-10-CM | POA: Diagnosis not present

## 2015-09-23 DIAGNOSIS — R0989 Other specified symptoms and signs involving the circulatory and respiratory systems: Secondary | ICD-10-CM | POA: Diagnosis present

## 2015-09-23 DIAGNOSIS — Z87891 Personal history of nicotine dependence: Secondary | ICD-10-CM | POA: Diagnosis not present

## 2015-09-23 DIAGNOSIS — H34219 Partial retinal artery occlusion, unspecified eye: Secondary | ICD-10-CM

## 2015-09-23 DIAGNOSIS — E785 Hyperlipidemia, unspecified: Secondary | ICD-10-CM | POA: Insufficient documentation

## 2015-09-26 ENCOUNTER — Encounter: Payer: Self-pay | Admitting: Cardiothoracic Surgery

## 2015-09-26 ENCOUNTER — Ambulatory Visit: Payer: Medicare Other | Admitting: Interventional Cardiology

## 2015-09-26 ENCOUNTER — Ambulatory Visit (INDEPENDENT_AMBULATORY_CARE_PROVIDER_SITE_OTHER): Payer: Medicare Other | Admitting: Cardiothoracic Surgery

## 2015-09-26 VITALS — BP 152/85 | HR 66 | Resp 16 | Ht 69.0 in | Wt 200.0 lb

## 2015-09-26 DIAGNOSIS — I712 Thoracic aortic aneurysm, without rupture, unspecified: Secondary | ICD-10-CM

## 2015-09-26 NOTE — Progress Notes (Signed)
[address]       [name] [address]             [phone]                    [name]  Medical Record [id number] Date of Birth: 1950/02/07  Referring:PATERSON,DANIEL G, MD Primary Care: [name], MD  Chief Complaint:    Chief Complaint  Patient presents with  . Thoracic Aortic Aneurysm    further discuss surgery, review Cardiac Clearance, ECHO 09/20/15 and Cardiac CT 08/28/15     History of Present Illness:    Lonnie Hansen 65 y.o. male is seen in the office  today for Dilated ascending aorta. The patient complains of increasing right hip pain making it difficult for him to be active. He was seeing Dr. Ninfa Linden for consideration of right hip replacement. In preparation for this a chest x-ray was performed leading to a CT scan of the chest. The patient has a previous CT scan of the chest done during admission in 2012 for syncope.  Currently he denies any cardiac symptoms denies chest pain exertional resting shortness of breath though he does note his activity is limited due to his right hip pain other than episode of syncope in 2012 he's had no repeat syncopal episodes, notes rare palpitations. He has no previous history of myocardial infarction.   His family history is significant for his father who had had a MI prior but "drop dead at home" at age 71 in 36. His mother died of lymphoma date 15 1 brother and 1 sister alive his brothers had a myocardial infarction in the past., He has no children. There is no other history can on Cerone calls her cousins of unexplained sudden death at a young age.   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       Current Activity/ Functional Status:  Patient is independent with mobility/ambulation, transfers, ADL's, IADL's.  But does use a cane because of pain in the right hip  Zubrod Score: At the time of  surgery this patient's most appropriate activity status/level should be described as: []     0    Normal activity, no symptoms [x]     1    Restricted in physical strenuous activity but ambulatory, able to do out light work []     2    Ambulatory and capable of self care, unable to do work activities, up and about               >50 % of waking hours                              []     3    Only limited self care, in bed greater than 50% of waking hours []     4    Completely disabled, no self care, confined to bed or chair []     5    Moribund   Past Medical History:  Diagnosis Date  . Anemia   . Asymptomatic bilateral carotid artery stenosis 08/2015   1-39%   . Forgetfulness   . GI bleed   . HLD (hyperlipidemia)   . Hypertension   . Insomnia   . OA (osteoarthritis) of hip     No  past surgical history on file.  Family History  Problem Relation Age of Onset  . Heart disease Father   . Heart attack Father   . Thyroid disease Sister   As noted above his father died suddenly at age 47, he had a previous history of myocardial infarction   Social History   Social History  . Marital Status: Married    Spouse Name: N/A  . Number of Children: N/A  . Years of Education: N/A   Occupational History  . Not on file.   Social History Main Topics  . Smoking status: Former Smoker -- 1.00 packs/day for 25 years    Types: Cigarettes  . Smokeless tobacco: Not on file  . Alcohol Use: No  . Drug Use: No  . Sexual Activity: Not on file   Other Topics Concern  . Not on file   Social History Narrative   Married.   He is a Optometrist.       History  Smoking Status  . Former Smoker  . Packs/day: 1.00  . Years: 25.00  . Types: Cigarettes  Smokeless Tobacco  . Never Used    History  Alcohol Use No     No Known Allergies  Current Outpatient Prescriptions  Medication Sig Dispense Refill  . amLODipine (NORVASC) 10 MG tablet Take 10 mg by mouth daily.    .  Aspirin-Acetaminophen (GOODY BODY PAIN) 500-325 MG PACK Take by mouth 2 (two) times daily as needed (for hip pain).    Marland Kitchen atenolol (TENORMIN) 25 MG tablet Take 12.5 mg by mouth daily.     . bimatoprost (LUMIGAN) 0.01 % SOLN Place 1 drop into both eyes at bedtime.    Marland Kitchen HYDROcodone-homatropine (HYCODAN) 5-1.5 MG/5ML syrup Take 5 mLs by mouth every 4 (four) hours as needed for cough.    . pantoprazole (PROTONIX) 40 MG tablet Take 40 mg by mouth daily.     No current facility-administered medications for this visit.      Review of Systems:     Cardiac Review of Systems: Y or N  Chest Pain [  n  ]  Resting SOB [  n ] Exertional SOB  [n  ]  Orthopnea [ n ]   Pedal Edema [   ]    Palpitations [  ] Syncope  [ once 2012 ]   Presyncope [ n  ]  General Review of Systems: [Y] = yes [  ]=no Constitional: recent weight change [ gain ];  Wt loss over the last 3 months [   ] anorexia [  ]; fatigue [ y ]; nausea [ n ]; night sweats [ n ]; fever [n]; or chills [  n];          Dental: poor dentition[  n]; Last Dentist visit:   Eye : blurred vision [  ]; diplopia [   ]; vision changes [  ];  Amaurosis fugax[  ]; Resp: cough [n  ];  wheezing[ n ];  hemoptysis[ n ]; shortness of breath[  ]; paroxysmal nocturnal dyspnea[  ]; dyspnea on exertion[  ]; or orthopnea[  ];  GI:  gallstones[  ], vomiting[  ];  dysphagia[  ]; melena[  ];  hematochezia [  ]; heartburn[  ];   Hx of  Colonoscopy[ y last year ]; GU: kidney stones [  ]; hematuria[  ];   dysuria [  ];  nocturia[  ];  history of     obstruction [  ];  urinary frequency [  ]             Skin: rash, swelling[  ];, hair loss[  ];  peripheral edema[  ];  or itching[  ]; Musculosketetal: myalgias[  ];  joint swelling[  ];  joint erythema[  ];  joint pain[  ];  back pain[  ];  Heme/Lymph: bruising[  ];  bleeding[  ];  anemia[  ];  Neuro: TIA[  ];  headaches[  ];  stroke[  ];  vertigo[  ];  seizures[ n ];   paresthesias[  ];  difficulty walking[ y ];  Psych:depression[   ]; anxiety[  ];  Endocrine: diabetes[ n ];  thyroid dysfunction[n ];  Immunizations: Flu up to date [ y]; Pneumococcal up to date [ n ];  Other:  Physical Exam: BP (!) 152/85   Pulse 66   Resp 16   Ht 5\' 9"  (1.753 m)   Wt 200 lb (90.7 kg)   SpO2 99% Comment: ON RA  BMI 29.53 kg/m   PHYSICAL EXAMINATION: General appearance: alert, cooperative and appears older than stated age Head: Normocephalic, without obvious abnormality, atraumatic Neck: no adenopathy, no carotid bruit, no JVD, supple, symmetrical, trachea midline and thyroid not enlarged, symmetric, no tenderness/mass/nodules Lymph nodes: Cervical, supraclavicular, and axillary nodes normal. Resp: clear to auscultation bilaterally Back: symmetric, no curvature. ROM normal. No CVA tenderness. Cardio: regular rate and rhythm, S1, S2 normal, no murmur, click, rub or gallop GI: soft, non-tender; bowel sounds normal; no masses,  no organomegaly Extremities: extremities normal, atraumatic, no cyanosis or edema and Homans sign is negative, no sign of DVT Neurologic: Grossly normal Palpable dp and pt pulses bilaterial  Diagnostic Studies & Laboratory data:     Recent Radiology Findings:  Ct Coronary Morph W/cta Cor W/score W/ca W/cm &/or Wo/cm  Addendum Date: 08/28/2015   ADDENDUM REPORT: 08/28/2015 13:09 CLINICAL DATA:  Aortic Aneurysm EXAM: Cardiac  CT TECHNIQUE: The patient was scanned on a Philips 256 scanner. A 120 kV retrospective scan was triggered in the descending thoracic aorta at 111 HU's. Gantry rotation speed was 270 msecs and collimation was .9 mm. No beta blockade or nitro were given. The 3D data set was reconstructed in 5% intervals of the R-R cycle. Systolic and diastolic phases were analyzed on a dedicated work station using MPR, MIP and VRT modes. The patient received 80 cc of contrast. FINDINGS: Aortic Valve:  Trileaflet without significant calcium Aorta: Fusiform dilatation of the ascending aorta widest at the sinus  level. Bovine Arch with no coarctation. Isolated area of calcification at the isthmus with mild aneurysmal dilatation in this area as well Orthogonal double oblique measurements as follows Sinotubular Junction:  40 mm Ascending Thoracic Aorta:  42 mm Aortic Arch:  40 mm Isthmus:  40 mm Descending Thoracic Aorta:  27 mm Sinus of Valsalva Measurements: Non-coronary:  45 mm Right coronary: 46 mm Left coronary: 48 mm : 1) Trileaflet aortic valve with no significant calcification 2) Fusiform dilatation of the aortic sinuses maximum measurement in coronal view 51 mm. However anatomic short axis Views show largest diameter of 48 mm for left coronary sinus 3) Tortuous left subclavian vein 4) Bovine Arch 5) Mild dilatation of the aortic root, arch and isthmus 6) Normal descending thoracic aorta with no coarctation [place] Rouge Electronically Signed   By: Jenkins Rouge M.D.   On: 08/28/2015 13:09   Result Date: 08/28/2015 EXAM: OVER-READ INTERPRETATION  CT CHEST The following report is an over-read performed  by radiologist Dr. Rebekah Chesterfield [facility] on 08/28/2015. This over-read does not include interpretation of cardiac or coronary anatomy or pathology. The coronary calcium score/coronary CTA interpretation by the cardiologist is attached. COMPARISON:  Chest CT 07/24/2015. FINDINGS: Aortic atherosclerosis. Mild aneurysmal dilatation of the ascending thoracic aorta and the arch, both of which measure 4.5 cm in diameter. The isthmus is also aneurysmal measuring 4.3 cm in diameter. The remainder the descending thoracic aorta is otherwise normal in caliber. Aortic root dilatation (to be described separately). No evidence of thoracic aortic dissection. Within the visualized portions of the thorax there are no suspicious appearing pulmonary nodules or masses, there is no acute consolidative airspace disease, no pleural effusions, no pneumothorax and no lymphadenopathy. Visualized portions of the upper abdomen  are unremarkable. There are no aggressive appearing lytic or blastic lesions noted in the visualized portions of the skeleton. IMPRESSION: 1. No acute incidental noncardiac findings are noted. 2. Aneurysmal dilatation of the ascending thoracic aorta, aortic arch and isthmus, as above. No thoracic aortic dissection at this time. Recommend semi-annual imaging followup by CTA or MRA. This recommendation follows 2010 ACCF/AHA/AATS/ACR/ASA/SCA/SCAI/SIR/STS/SVM Guidelines for the Diagnosis and Management of Patients With Thoracic Aortic Disease. Circulation. 2010; 121SP:1689793. Electronically Signed: By: Vinnie Langton M.D. On: 08/28/2015 10:59    Ct Chest W Contrast  07/24/2015  CLINICAL DATA:  Ectatic thoracic aorta on recent chest radiograph EXAM: CT CHEST WITH CONTRAST TECHNIQUE: Multidetector CT imaging of the chest was performed during intravenous contrast administration. CONTRAST:  67mL ISOVUE-300 IOPAMIDOL (ISOVUE-300) INJECTION 61% COMPARISON:  Most recent available chest CT April 27, 2010 FINDINGS: Mediastinum/Lymph Nodes: There is dilatation of the ascending thoracic aorta. Just beyond the aortic valve, the ascending thoracic aorta measures 6.1 x 4.9 cm, stable from prior study. At the level of the proximal right main pulmonary artery, the thoracic aorta has a maximum transverse diameter of 4.6 x 4.1 cm, essentially stable from prior study. At the level of the aortic arch, the transverse diameter is 4.6 cm, stable. There is no thoracic aortic dissection. Visualized great vessels appear somewhat tortuous but otherwise unremarkable. There is no major vessel pulmonary embolus. There are multiple foci of coronary artery calcification. There is no appreciable pericardial thickening. Thyroid appears unremarkable. There is no appreciable thoracic adenopathy. Lungs/Pleura: There is slight scarring in the lung bases. There is no parenchymal lung edema or consolidation. Upper abdomen: In the visualized upper  abdomen, there is cholelithiasis. Gallbladder wall does not appear appreciably thickened. There is a complex cyst in the upper left kidney containing calcification, unchanged compared to prior study. There is a parapelvic cyst on the right, incompletely visualized, measuring 4.2 x 3.5 cm. Musculoskeletal: There are no blastic or lytic bone lesions. IMPRESSION: Thoracic aortic prominence as noted above. Prominence is greatest just beyond the aortic valve. Question a degree of aortic valve disease. Recommend semi-annual imaging followup by CTA or MRA and referral to cardiothoracic surgery if not already obtained. This recommendation follows 2010 ACCF/AHA/AATS/ACR/ASA/SCA/SCAI/SIR/STS/SVM Guidelines for the Diagnosis and Management of Patients With Thoracic Aortic Disease. Circulation. 2010; 121: [id number]. Note that this aortic prominence is essentially stable compared to 2012 study. There is no periaortic fluid. No thoracic aortic dissection noted. No pericardial effusion. There are multiple areas of coronary artery calcification. No parenchymal lung edema or consolidation. No parenchymal lung mass or adenopathy. Cholelithiasis. Stable cystic lesion containing multiple calcifications in the upper pole left kidney. Parapelvic cyst right kidney. Electronically Signed   By: Lowella Grip III  M.D.   On: 07/24/2015 11:00    I have independently reviewed the above radiology studies  and reviewed the findings with the patient.  ECHO: Echocardiography  Patient:    Kason, Brashear MR #:       [id number] Study Date: 09/20/2015 Gender:     M Age:        31 Height:     175.3 cm Weight:     91.6 kg BSA:        2.14 m^2 Pt. Status: Room:   [place] MD  [place] MD  REFERRING    [name] [id number]  ATTENDING    Candee Furbish, M.D.  SONOGRAPHER  Cindy Hazy, RDCS  PERFORMING   [facility],  Outpatient  cc:  ------------------------------------------------------------------- LV EF: 60% -   65%  ------------------------------------------------------------------- Indications:      I35.1 Aortic Insufficiency.  I71.2 Ascending Aortic Aneurysm.  ------------------------------------------------------------------- History:   PMH:  Acquired from the patient and from the patient&'s chart.  PMH:  Dilated Thoracic Aorta.  Risk factors:  Hypertension. Dyslipidemia.  ------------------------------------------------------------------- Study Conclusions  - Left ventricle: The cavity size was normal. There was moderate   focal basal hypertrophy of the septum. Systolic function was   normal. The estimated ejection fraction was in the range of 60%   to 65%. Wall motion was normal; there were no regional wall   motion abnormalities. Doppler parameters are consistent with   abnormal left ventricular relaxation (grade 1 diastolic   dysfunction). - Aortic valve: There was moderate to severe regurgitation. - Aorta: Aortic root dimension: 43 mm (ED). - Ascending aorta: The ascending aorta was mildly dilated. - Right ventricle: The cavity size was mildly dilated. Wall   thickness was normal.  ------------------------------------------------------------------- Study data:   Study status:  Routine.  Procedure:  The patient reported no pain pre or post test. Transthoracic echocardiography for left ventricular function evaluation, for right ventricular function evaluation, and for assessment of valvular function. Image quality was adequate.  Study completion:  There were no complications.          Echocardiography.  M-mode, complete 2D, spectral Doppler, and color Doppler.  Birthdate:  Patient birthdate: [birth year].  Age:  Patient is 65 yr old.  Sex:  Gender: male.    BMI: 29.8 kg/m^2.  Blood pressure:     136/74  Patient status:  Outpatient.  Study date:  Study date: 09/20/2015.  Study time: 10:58 AM.  Location:  [place] [facility]  -------------------------------------------------------------------  ------------------------------------------------------------------- Left ventricle:  The cavity size was normal. There was moderate focal basal hypertrophy of the septum. Systolic function was normal. The estimated ejection fraction was in the range of 60% to 65%. Wall motion was normal; there were no regional wall motion abnormalities. Doppler parameters are consistent with abnormal left ventricular relaxation (grade 1 diastolic dysfunction).  ------------------------------------------------------------------- Aortic valve:   Trileaflet; normal thickness leaflets. Mobility was not restricted.  Doppler:  Transvalvular velocity was within the normal range. There was no stenosis. There was moderate to severe regurgitation.  ------------------------------------------------------------------- Aorta:  Ascending aorta: The ascending aorta was mildly dilated.  ------------------------------------------------------------------- Mitral valve:   Structurally normal valve.   Mobility was not restricted.  Doppler:  Transvalvular velocity was within the normal range. There was no evidence for stenosis. There was no regurgitation.  ------------------------------------------------------------------- Left atrium:  The atrium was normal in size.  ------------------------------------------------------------------- Right ventricle:  The cavity size was mildly dilated. Wall  thickness was normal. Systolic function was normal.  ------------------------------------------------------------------- Pulmonic valve:    Doppler:  Transvalvular velocity was within the normal range. There was no evidence for stenosis.  ------------------------------------------------------------------- Tricuspid valve:   Structurally normal valve.    Doppler: Transvalvular velocity was within  the normal range. There was trivial regurgitation.  ------------------------------------------------------------------- Pulmonary artery:   The main pulmonary artery was normal-sized. Systolic pressure was within the normal range.  ------------------------------------------------------------------- Right atrium:  The atrium was normal in size.  ------------------------------------------------------------------- Pericardium:  There was no pericardial effusion.  ------------------------------------------------------------------- Systemic veins: Inferior vena cava: The vessel was normal in size.  ------------------------------------------------------------------- Measurements   Left ventricle                           Value        Reference  LV ID, ED, PLAX chordal                  50.9  mm     43 - 52  LV ID, ES, PLAX chordal                  34.6  mm     23 - 38  LV fx shortening, PLAX chordal           32    %      >=29  LV PW thickness, ED                      10.9  mm     ---------  IVS/LV PW ratio, ED              (H)     1.39         <=1.3  Stroke volume, 2D                        124   ml     ---------  Stroke volume/bsa, 2D                    58    ml/m^2 ---------  LV e&', lateral                           5.26  cm/s   ---------  LV E/e&', lateral                         7.7          ---------  LV e&', medial                            5.55  cm/s   ---------  LV E/e&', medial                          7.3          ---------  LV e&', average                           5.41  cm/s   ---------  LV E/e&', average                         7.49         ---------  Ventricular septum                       Value        Reference  IVS thickness, ED                        15.2  mm     ---------    LVOT                                     Value        Reference  LVOT ID, S                               30    mm     ---------  LVOT area                                7.07  cm^2    ---------  LVOT ID                                  30    mm     ---------  LVOT peak velocity, S                    76.7  cm/s   ---------  LVOT mean velocity, S                    53.7  cm/s   ---------  LVOT VTI, S                              17.5  cm     ---------  LVOT peak gradient, S                    2     mm Hg  ---------  Stroke volume (SV), LVOT DP              123.7 ml     ---------  Stroke index (SV/bsa), LVOT DP           57.9  ml/m^2 ---------    Aortic valve                             Value        Reference  Aortic regurg pressure half-time         916   ms     ---------    Aorta                                    Value        Reference  Aortic root ID, ED                       43    mm     ---------    Left atrium  Value        Reference  LA ID, A-P, ES                           33    mm     ---------  LA ID/bsa, A-P                           1.54  cm/m^2 <=2.2  LA volume, S                             37    ml     ---------  LA volume/bsa, S                         17.3  ml/m^2 ---------  LA volume, ES, 1-p A4C                   31    ml     ---------  LA volume/bsa, ES, 1-p A4C               14.5  ml/m^2 ---------  LA volume, ES, 1-p A2C                   44    ml     ---------  LA volume/bsa, ES, 1-p A2C               20.6  ml/m^2 ---------    Mitral valve                             Value        Reference  Mitral E-wave peak velocity              40.5  cm/s   ---------  Mitral A-wave peak velocity              57.8  cm/s   ---------  Mitral deceleration time         (H)     257   ms     150 - 230  Mitral E/A ratio, peak                   0.7          ---------    Tricuspid valve                          Value        Reference  Tricuspid regurg peak velocity           251   cm/s   ---------  Tricuspid peak RV-RA gradient            25    mm Hg  ---------    Right ventricle                          Value        Reference  RV s&',  lateral, S                        18    cm/s   ---------  Legend: (L)  and  (H)  mark values outside specified reference  range.  ------------------------------------------------------------------- Prepared and Electronically Authenticated by  Candee Furbish, M.D. 2017-08-25T13:27:13  Recent Lab Findings:    Aortic Size Index=      5.0   /Body surface area is 2.1 meters squared. = 2.34  < 2.75 cm/m2      4% risk per year 2.75 to 4.25          8% risk per year > 4.25 cm/m2    20% risk per year    Assessment / Plan:   Dilated ascending aorta and aortic root, without murmur of aortic insufficiency. Likely stable since 2012 Or slightly larger. Follow-up dated CT of the aortic root results are noted above , we'll plan to see the patient back in 6 months with a follow-up CT scan.  He will recontact cardiology concerning his echo results and follow-up on blood pressure control  1 / diligent blood pressure control including a beta blocker ,  2/ echocardiogram done t to evaluate aortic valve,  3/ reestablished  cardiology care seen by Dr Lonia Blood - ECHO done  4/ gated CT scan of the aortic root. - done see above   There was moderate to severe regurgitation On echo  EF 60- 65%   LV ID, ED, PLAX chordal                  50.9  mm     43 - 52  LV ID, ES, PLAX chordal                  34.6  mm     23 - 38     I  Grace Isaac MD      [address] [place] [address] Office [phone]   Beeper [phone]  09/26/2015 10:56 AM

## 2015-09-27 ENCOUNTER — Telehealth: Payer: Self-pay

## 2015-09-27 NOTE — Telephone Encounter (Signed)
Patient called no answer.[facility] for echo results.

## 2015-10-01 ENCOUNTER — Telehealth: Payer: Self-pay

## 2015-10-01 NOTE — Telephone Encounter (Signed)
Spoke to patient echo results given.Dr.Jordan advised needs to follow up in 6 months.He stated Dr.Gerhardt told him to keep B/P under control.B/P at his office visit elevated.B/P check appointment scheduled 10/22/15 10:00 am at [facility] office.Advised to check B/P daily and bring diary of B/P's to appointment.

## 2015-10-22 ENCOUNTER — Ambulatory Visit (INDEPENDENT_AMBULATORY_CARE_PROVIDER_SITE_OTHER): Payer: Medicare Other | Admitting: *Deleted

## 2015-10-22 VITALS — BP 140/72 | HR 60

## 2015-10-22 DIAGNOSIS — I10 Essential (primary) hypertension: Secondary | ICD-10-CM

## 2015-10-22 NOTE — Progress Notes (Signed)
Called pt w Dr. Doug Sou advisements and let him know he may call if any further concerns or questions. Pt voiced understanding and thanks.

## 2015-10-22 NOTE — Progress Notes (Signed)
Met with patient for nurse BP check. He reports he is taking meds as instructed, though he usually takes his morning meds but now and did not do so. (reports he has taken amlodipine today, but not atenolol).  He states he takes atenolol 12.38m daily and sometimes takes a quarter tablet at night because it helps him sleep.  BPs taken today were done after I checked patient in and had in sitting position at rest for several minutes. He expressed no concern regarding BPs, though he notes he is not consistently checking at home. [facility] We discussed keeping a consistent log of BPs at home and that this was part of the purpose for visit.  Suggested no changes for now, he is aware I will route to Dr. JMartiniqueto review and make any adjustments or continue as is.

## 2015-10-22 NOTE — Patient Instructions (Signed)
Continue medications as prescribed. I will route a message to your cardiologist to discuss any changes that would be recommended.

## 2015-10-25 ENCOUNTER — Other Ambulatory Visit (INDEPENDENT_AMBULATORY_CARE_PROVIDER_SITE_OTHER): Payer: Medicare Other

## 2015-10-25 DIAGNOSIS — I10 Essential (primary) hypertension: Secondary | ICD-10-CM

## 2015-10-25 DIAGNOSIS — I712 Thoracic aortic aneurysm, without rupture, unspecified: Secondary | ICD-10-CM

## 2015-12-11 DIAGNOSIS — E784 Other hyperlipidemia: Secondary | ICD-10-CM | POA: Diagnosis not present

## 2015-12-11 DIAGNOSIS — I10 Essential (primary) hypertension: Secondary | ICD-10-CM | POA: Diagnosis not present

## 2015-12-11 DIAGNOSIS — R8299 Other abnormal findings in urine: Secondary | ICD-10-CM | POA: Diagnosis not present

## 2015-12-11 DIAGNOSIS — Z23 Encounter for immunization: Secondary | ICD-10-CM | POA: Diagnosis not present

## 2015-12-11 DIAGNOSIS — Z125 Encounter for screening for malignant neoplasm of prostate: Secondary | ICD-10-CM | POA: Diagnosis not present

## 2015-12-17 DIAGNOSIS — I10 Essential (primary) hypertension: Secondary | ICD-10-CM | POA: Diagnosis not present

## 2015-12-17 DIAGNOSIS — R3129 Other microscopic hematuria: Secondary | ICD-10-CM | POA: Diagnosis not present

## 2015-12-17 DIAGNOSIS — G4733 Obstructive sleep apnea (adult) (pediatric): Secondary | ICD-10-CM | POA: Diagnosis not present

## 2015-12-17 DIAGNOSIS — M25551 Pain in right hip: Secondary | ICD-10-CM | POA: Diagnosis not present

## 2015-12-17 DIAGNOSIS — I712 Thoracic aortic aneurysm, without rupture: Secondary | ICD-10-CM | POA: Diagnosis not present

## 2015-12-17 DIAGNOSIS — E784 Other hyperlipidemia: Secondary | ICD-10-CM | POA: Diagnosis not present

## 2015-12-17 DIAGNOSIS — Z1389 Encounter for screening for other disorder: Secondary | ICD-10-CM | POA: Diagnosis not present

## 2015-12-17 DIAGNOSIS — Z6830 Body mass index (BMI) 30.0-30.9, adult: Secondary | ICD-10-CM | POA: Diagnosis not present

## 2015-12-17 DIAGNOSIS — Z0000 Encounter for general adult medical examination without abnormal findings: Secondary | ICD-10-CM | POA: Diagnosis not present

## 2015-12-17 DIAGNOSIS — R490 Dysphonia: Secondary | ICD-10-CM | POA: Diagnosis not present

## 2015-12-17 DIAGNOSIS — R600 Localized edema: Secondary | ICD-10-CM | POA: Diagnosis not present

## 2016-01-03 DIAGNOSIS — Z1212 Encounter for screening for malignant neoplasm of rectum: Secondary | ICD-10-CM | POA: Diagnosis not present

## 2016-02-21 ENCOUNTER — Other Ambulatory Visit: Payer: Self-pay | Admitting: Cardiothoracic Surgery

## 2016-02-21 DIAGNOSIS — I719 Aortic aneurysm of unspecified site, without rupture: Secondary | ICD-10-CM

## 2016-02-26 DIAGNOSIS — R3121 Asymptomatic microscopic hematuria: Secondary | ICD-10-CM | POA: Diagnosis not present

## 2016-02-26 DIAGNOSIS — N400 Enlarged prostate without lower urinary tract symptoms: Secondary | ICD-10-CM | POA: Diagnosis not present

## 2016-03-20 DIAGNOSIS — Z6829 Body mass index (BMI) 29.0-29.9, adult: Secondary | ICD-10-CM | POA: Diagnosis not present

## 2016-03-20 DIAGNOSIS — I10 Essential (primary) hypertension: Secondary | ICD-10-CM | POA: Diagnosis not present

## 2016-03-20 DIAGNOSIS — R05 Cough: Secondary | ICD-10-CM | POA: Diagnosis not present

## 2016-03-20 DIAGNOSIS — G4733 Obstructive sleep apnea (adult) (pediatric): Secondary | ICD-10-CM | POA: Diagnosis not present

## 2016-03-20 DIAGNOSIS — Z1389 Encounter for screening for other disorder: Secondary | ICD-10-CM | POA: Diagnosis not present

## 2016-03-26 ENCOUNTER — Encounter: Payer: Self-pay | Admitting: Cardiothoracic Surgery

## 2016-03-26 ENCOUNTER — Ambulatory Visit
Admission: RE | Admit: 2016-03-26 | Discharge: 2016-03-26 | Disposition: A | Discharge status: Home / Self Care | Payer: Medicare Other | Source: Ambulatory Visit | Attending: Cardiothoracic Surgery | Admitting: Cardiothoracic Surgery

## 2016-03-26 ENCOUNTER — Ambulatory Visit (INDEPENDENT_AMBULATORY_CARE_PROVIDER_SITE_OTHER): Payer: Medicare Other | Admitting: Cardiothoracic Surgery

## 2016-03-26 VITALS — BP 140/75 | HR 56 | Resp 20 | Ht 69.0 in | Wt 195.0 lb

## 2016-03-26 DIAGNOSIS — I77810 Thoracic aortic ectasia: Secondary | ICD-10-CM

## 2016-03-26 DIAGNOSIS — I719 Aortic aneurysm of unspecified site, without rupture: Secondary | ICD-10-CM

## 2016-03-26 DIAGNOSIS — I712 Thoracic aortic aneurysm, without rupture: Secondary | ICD-10-CM | POA: Diagnosis not present

## 2016-03-26 MED ORDER — IOPAMIDOL (ISOVUE-370) INJECTION 76%
75.0000 mL | Freq: Once | INTRAVENOUS | Status: AC | PRN
  Administered 2016-03-26: 75 mL via INTRAVENOUS

## 2016-03-26 NOTE — Progress Notes (Signed)
[address]       [place] [address]             [phone]                    Lonnie Hansen Medical Record #[id number] Date of Birth: Jan 07, 1951  Referring:PATERSON,DANIEL G, MD Primary Care: [name], MD  Chief Complaint:    Chief Complaint  Patient presents with  . Thoracic Aortic Aneurysm    6 month f/u CTA Chest, surveillance on Dilated ascending aorta and aortic root    History of Present Illness:    Lonnie Hansen 66 y.o. male is seen in the office  today for Dilated ascending aorta. The patient complains of increasing right hip pain making it difficult for him to be active. He was seeing Dr. Ninfa Linden for consideration of right hip replacement. In preparation for this a chest x-ray was performed leading to a CT scan of the chest. The patient has a previous CT scan of the chest done during admission in 2012 for syncope.  Currently he denies any cardiac symptoms denies chest pain exertional resting shortness of breath though he does note his activity is limited due to his right hip pain other than episode of syncope in 2012 he's had no repeat syncopal episodes, notes rare palpitations. He has no previous history of myocardial infarction.   His family history is significant for his father who had had a MI prior but "drop dead at home" at age 27 in 81. His mother died of lymphoma date 48 1 brother and 1 sister alive his brothers had a myocardial infarction in the past., He has no children. There is no other history can on Cerone calls her cousins of unexplained sudden death at a young age.   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    Patient returns today with a repeat CT scan of the chest to compare to the previous one done in August 2017.  He notes his hip is continually gotten worse to the point he now uses a cane to ambulate and would like  to get it replaced.   Current Activity/ Functional Status:  Patient is independent with mobility/ambulation, transfers, ADL's, IADL's.  But does use a cane because of pain in the right hip  Zubrod Score: At the time of surgery this patient's most appropriate activity status/level should be described as: []     0    Normal activity, no symptoms [x]     1    Restricted in physical strenuous activity but ambulatory, able to do out light work []     2    Ambulatory and capable of self care, unable to do work activities, up and about               >50 % of waking hours                              []     3    Only limited self care, in bed greater than 50% of waking hours []     4    Completely disabled, no self care, confined to bed or chair []     5    Moribund   Past Medical History:  Diagnosis Date  . Anemia   .  Asymptomatic bilateral carotid artery stenosis 08/2015   1-39%   . Forgetfulness   . GI bleed   . HLD (hyperlipidemia)   . Hypertension   . Insomnia   . OA (osteoarthritis) of hip     No past surgical history on file.  Family History  Problem Relation Age of Onset  . Heart disease Father   . Heart attack Father   . Thyroid disease Sister   As noted above his father died suddenly at age 18, he had a previous history of myocardial infarction   Social History   Social History  . Marital Status: Married    Spouse Name: N/A  . Number of Children: N/A  . Years of Education: N/A   Occupational History  . Not on file.   Social History Main Topics  . Smoking status: Former Smoker -- 1.00 packs/day for 25 years    Types: Cigarettes  . Smokeless tobacco: Not on file  . Alcohol Use: No  . Drug Use: No  . Sexual Activity: Not on file   Other Topics Concern  . Not on file   Social History Narrative   Married.   He is a Optometrist.       History  Smoking Status  . Former Smoker  . Packs/day: 1.00  . Years: 25.00  . Types: Cigarettes  Smokeless Tobacco  .  Never Used    History  Alcohol Use No     No Known Allergies  Current Outpatient Prescriptions  Medication Sig Dispense Refill  . amLODipine (NORVASC) 10 MG tablet Take 10 mg by mouth daily.    . Aspirin-Acetaminophen (GOODY BODY PAIN) 500-325 MG PACK Take by mouth 2 (two) times daily as needed (for hip pain).    Marland Kitchen atenolol (TENORMIN) 25 MG tablet Take 12.5 mg by mouth daily.     . bimatoprost (LUMIGAN) 0.01 % SOLN Place 1 drop into both eyes at bedtime.    Marland Kitchen HYDROcodone-homatropine (HYCODAN) 5-1.5 MG/5ML syrup Take 5 mLs by mouth every 4 (four) hours as needed for cough.    . pantoprazole (PROTONIX) 40 MG tablet Take 40 mg by mouth daily.     No current facility-administered medications for this visit.      Review of Systems:     Cardiac Review of Systems: Y or N  Chest Pain [  n  ]  Resting SOB [  n ] Exertional SOB  [n  ]  Orthopnea [ n ]   Pedal Edema [   ]    Palpitations [  ] Syncope  [ once 2012 ]   Presyncope [ n  ]  General Review of Systems: [Y] = yes [  ]=no Constitional: recent weight change [ gain ];  Wt loss over the last 3 months [   ] anorexia [  ]; fatigue [ y ]; nausea [ n ]; night sweats [ n ]; fever [n]; or chills [  n];          Dental: poor dentition[  n]; Last Dentist visit:   Eye : blurred vision [  ]; diplopia [   ]; vision changes [  ];  Amaurosis fugax[  ]; Resp: cough [n  ];  wheezing[ n ];  hemoptysis[ n ]; shortness of breath[  ]; paroxysmal nocturnal dyspnea[  ]; dyspnea on exertion[  ]; or orthopnea[  ];  GI:  gallstones[  ], vomiting[  ];  dysphagia[  ]; melena[  ];  hematochezia [  ];  heartburn[  ];   Hx of  Colonoscopy[ y last year ]; GU: kidney stones [  ]; hematuria[  ];   dysuria [  ];  nocturia[  ];  history of     obstruction [  ]; urinary frequency [  ]             Skin: rash, swelling[  ];, hair loss[  ];  peripheral edema[  ];  or itching[  ];   Musculosketetal: myalgias[  ];  joint swelling[  ];  joint erythema[  ];  joint pain[  ];   back pain[  ];  Heme/Lymph: bruising[  ];  bleeding[  ];  anemia[  ];  Neuro: TIA[  ];  headaches[  ];  stroke[  ];  vertigo[  ];  seizures[ n ];   paresthesias[  ];  difficulty walking[ y ];  Psych:depression[  ]; anxiety[  ];  Endocrine: diabetes[ n ];  thyroid dysfunction[n ];  Immunizations: Flu up to date [ y]; Pneumococcal up to date [ n ];  Other:  Physical Exam: BP 140/75   Pulse (!) 56   Resp 20   Ht 5\' 9"  (1.753 m)   Wt 195 lb (88.5 kg)   SpO2 97% Comment: RA  BMI 28.80 kg/m   PHYSICAL EXAMINATION: General appearance: alert, cooperative and appears older than stated age Head: Normocephalic, without obvious abnormality, atraumatic Neck: no adenopathy, no carotid bruit, no JVD, supple, symmetrical, trachea midline and thyroid not enlarged, symmetric, no tenderness/mass/nodules Lymph nodes: Cervical, supraclavicular, and axillary nodes normal. Resp: clear to auscultation bilaterally Back: symmetric, no curvature. ROM normal. No CVA tenderness. Cardio: regular rate and rhythm, S1, S2 normal, no murmur, click, rub or gallop GI: soft, non-tender; bowel sounds normal; no masses,  no organomegaly Extremities: extremities normal, atraumatic, no cyanosis or edema and Homans sign is negative, no sign of DVT Neurologic: Grossly normal Palpable dp and pt pulses bilaterial  Diagnostic Studies & Laboratory data:     Recent Radiology Findings:    Ct Angio Chest Aorta W/cm &/or Wo/cm  Addendum Date: 03/29/2016   ADDENDUM REPORT: 03/29/2016 14:08 ADDENDUM: The aortic root at the level of the sinuses of Valsalva was remeasured at approximately 4.9- 5.2 cm in greatest diameter depending on axis of measurement. This appears stable compared to the prior coronary CTA on 08/28/2015. Electronically Signed   By: Aletta Edouard M.D.   On: 03/29/2016 14:08    Ct Angio Chest Aorta W/cm &/or Wo/cm  Result Date: 03/26/2016 CLINICAL DATA:  Thoracic aortic aneurysm without rupture. EXAM: CT  ANGIOGRAPHY CHEST WITH CONTRAST TECHNIQUE: Multidetector CT imaging of the chest was performed using the standard protocol during bolus administration of intravenous contrast. Multiplanar CT image reconstructions and MIPs were obtained to evaluate the vascular anatomy. CONTRAST:  75 mL of Isovue 370 intravenously. COMPARISON:  CT scan of July 24, 2015. FINDINGS: Cardiovascular: There is no evidence of thoracic aortic dissection. Great vessels are widely patent without significant stenosis. Aneurysmal aortic root is noted measured at 5.6 cm. Proximal ascending thoracic aorta measures 4.1 cm in diameter. Transverse aortic arch measures 4.0 cm. Descending thoracic aorta has maximum measured diameter of 3.9 cm. Coronary artery calcifications are noted. Mediastinum/Nodes: No enlarged mediastinal, hilar, or axillary lymph nodes. Thyroid gland, trachea, and esophagus demonstrate no significant findings. Lungs/Pleura: Lungs are clear. No pleural effusion or pneumothorax. Upper Abdomen: Large gallstone is noted. Stable partially calcified left renal cyst is noted. Musculoskeletal: No chest wall abnormality. No acute  or significant osseous findings. Review of the MIP images confirms the above findings. IMPRESSION: Stable aneurysmal dilatation of aortic root is noted at 5.6 cm. 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 Recommend annual imaging followup by CTA or MRA. This recommendation follows 2010 ACCF/AHA/AATS/ACR/ASA/SCA/SCAI/SIR/STS/SVM Guidelines for the Diagnosis and Management of Patients with Thoracic Aortic Disease. Circulation. 2010; 121: G644-I347. Coronary artery calcifications are noted suggesting coronary disease. Large gallstone is noted. Electronically Signed   By: Marijo Conception, M.D.   On: 03/26/2016 15:37   I have independently reviewed the above radiology studies  and reviewed the findings with the patient. I've also discussed the CT findings with Dr. Nyoka Cowden , as the measurements I get  both on axial coronal and sagittal cuts measures the aortic root at approximate 5.2    Ct Coronary Morph W/cta Cor W/score W/ca W/cm &/or Wo/cm  Addendum Date: 08/28/2015   ADDENDUM REPORT: 08/28/2015 13:09 CLINICAL DATA:  Aortic Aneurysm EXAM: Cardiac  CT TECHNIQUE: The patient was scanned on a Philips 256 scanner. A 120 kV retrospective scan was triggered in the descending thoracic aorta at 111 HU's. Gantry rotation speed was 270 msecs and collimation was .9 mm. No beta blockade or nitro were given. The 3D data set was reconstructed in 5% intervals of the R-R cycle. Systolic and diastolic phases were analyzed on a dedicated work station using MPR, MIP and VRT modes. The patient received 80 cc of contrast. FINDINGS: Aortic Valve:  Trileaflet without significant calcium Aorta: Fusiform dilatation of the ascending aorta widest at the sinus level. Bovine Arch with no coarctation. Isolated area of calcification at the isthmus with mild aneurysmal dilatation in this area as well Orthogonal double oblique measurements as follows Sinotubular Junction:  40 mm Ascending Thoracic Aorta:  42 mm Aortic Arch:  40 mm Isthmus:  40 mm Descending Thoracic Aorta:  27 mm Sinus of Valsalva Measurements: Non-coronary:  45 mm Right coronary: 46 mm Left coronary: 48 mm : 1) Trileaflet aortic valve with no significant calcification 2) Fusiform dilatation of the aortic sinuses maximum measurement in coronal view 51 mm. However anatomic short axis Views show largest diameter of 48 mm for left coronary sinus 3) Tortuous left subclavian vein 4) Bovine Arch 5) Mild dilatation of the aortic root, arch and isthmus 6) Normal descending thoracic aorta with no coarctation [place] Rouge Electronically Signed   By: Jenkins Rouge M.D.   On: 08/28/2015 13:09   Result Date: 08/28/2015 EXAM: OVER-READ INTERPRETATION  CT CHEST The following report is an over-read performed by radiologist Dr. Rebekah Chesterfield [facility] on 08/28/2015.  This over-read does not include interpretation of cardiac or coronary anatomy or pathology. The coronary calcium score/coronary CTA interpretation by the cardiologist is attached. COMPARISON:  Chest CT 07/24/2015. FINDINGS: Aortic atherosclerosis. Mild aneurysmal dilatation of the ascending thoracic aorta and the arch, both of which measure 4.5 cm in diameter. The isthmus is also aneurysmal measuring 4.3 cm in diameter. The remainder the descending thoracic aorta is otherwise normal in caliber. Aortic root dilatation (to be described separately). No evidence of thoracic aortic dissection. Within the visualized portions of the thorax there are no suspicious appearing pulmonary nodules or masses, there is no acute consolidative airspace disease, no pleural effusions, no pneumothorax and no lymphadenopathy. Visualized portions of the upper abdomen are unremarkable. There are no aggressive appearing lytic or blastic lesions noted in the visualized portions of the skeleton. IMPRESSION: 1. No acute incidental noncardiac findings are noted. 2. Aneurysmal  dilatation of the ascending thoracic aorta, aortic arch and isthmus, as above. No thoracic aortic dissection at this time. Recommend semi-annual imaging followup by CTA or MRA. This recommendation follows 2010 ACCF/AHA/AATS/ACR/ASA/SCA/SCAI/SIR/STS/SVM Guidelines for the Diagnosis and Management of Patients With Thoracic Aortic Disease. Circulation. 2010; 121: P102-H852. Electronically Signed: By: Vinnie Langton M.D. On: 08/28/2015 10:59    Ct Chest W Contrast  07/24/2015  CLINICAL DATA:  Ectatic thoracic aorta on recent chest radiograph EXAM: CT CHEST WITH CONTRAST TECHNIQUE: Multidetector CT imaging of the chest was performed during intravenous contrast administration. CONTRAST:  3mL ISOVUE-300 IOPAMIDOL (ISOVUE-300) INJECTION 61% COMPARISON:  Most recent available chest CT April 27, 2010 FINDINGS: Mediastinum/Lymph Nodes: There is dilatation of the ascending  thoracic aorta. Just beyond the aortic valve, the ascending thoracic aorta measures 6.1 x 4.9 cm, stable from prior study. At the level of the proximal right main pulmonary artery, the thoracic aorta has a maximum transverse diameter of 4.6 x 4.1 cm, essentially stable from prior study. At the level of the aortic arch, the transverse diameter is 4.6 cm, stable. There is no thoracic aortic dissection. Visualized great vessels appear somewhat tortuous but otherwise unremarkable. There is no major vessel pulmonary embolus. There are multiple foci of coronary artery calcification. There is no appreciable pericardial thickening. Thyroid appears unremarkable. There is no appreciable thoracic adenopathy. Lungs/Pleura: There is slight scarring in the lung bases. There is no parenchymal lung edema or consolidation. Upper abdomen: In the visualized upper abdomen, there is cholelithiasis. Gallbladder wall does not appear appreciably thickened. There is a complex cyst in the upper left kidney containing calcification, unchanged compared to prior study. There is a parapelvic cyst on the right, incompletely visualized, measuring 4.2 x 3.5 cm. Musculoskeletal: There are no blastic or lytic bone lesions. IMPRESSION: Thoracic aortic prominence as noted above. Prominence is greatest just beyond the aortic valve. Question a degree of aortic valve disease. Recommend semi-annual imaging followup by CTA or MRA and referral to cardiothoracic surgery if not already obtained. This recommendation follows 2010 ACCF/AHA/AATS/ACR/ASA/SCA/SCAI/SIR/STS/SVM Guidelines for the Diagnosis and Management of Patients With Thoracic Aortic Disease. Circulation. 2010; 121: [id number]. Note that this aortic prominence is essentially stable compared to 2012 study. There is no periaortic fluid. No thoracic aortic dissection noted. No pericardial effusion. There are multiple areas of coronary artery calcification. No parenchymal lung edema or consolidation.  No parenchymal lung mass or adenopathy. Cholelithiasis. Stable cystic lesion containing multiple calcifications in the upper pole left kidney. Parapelvic cyst right kidney. Electronically Signed   By: Lowella Grip III M.D.   On: 07/24/2015 11:00    I have independently reviewed the above radiology studies  and reviewed the findings with the patient.  ECHO: Echocardiography  Patient:    Mariusz, Jubb MR #:       [id number] Study Date: 09/20/2015 Gender:     M Age:        32 Height:     175.3 cm Weight:     91.6 kg BSA:        2.14 m^2 Pt. Status: Room:   [place] MD  [place] MD  REFERRING    [name] [id number]  ATTENDING    Candee Furbish, M.D.  SONOGRAPHER  Cindy Hazy, RDCS  PERFORMING   [facility], Outpatient  cc:  ------------------------------------------------------------------- LV EF: 60% -   65%  ------------------------------------------------------------------- Indications:      I35.1 Aortic Insufficiency.  I71.2 Ascending Aortic  Aneurysm.  ------------------------------------------------------------------- History:   PMH:  Acquired from the patient and from the patient&'s chart.  PMH:  Dilated Thoracic Aorta.  Risk factors:  Hypertension. Dyslipidemia.  ------------------------------------------------------------------- Study Conclusions  - Left ventricle: The cavity size was normal. There was moderate   focal basal hypertrophy of the septum. Systolic function was   normal. The estimated ejection fraction was in the range of 60%   to 65%. Wall motion was normal; there were no regional wall   motion abnormalities. Doppler parameters are consistent with   abnormal left ventricular relaxation (grade 1 diastolic   dysfunction). - Aortic valve: There was moderate to severe regurgitation. - Aorta: Aortic root dimension: 43 mm (ED). - Ascending aorta: The ascending aorta was mildly dilated. - Right  ventricle: The cavity size was mildly dilated. Wall   thickness was normal.  ------------------------------------------------------------------- Study data:   Study status:  Routine.  Procedure:  The patient reported no pain pre or post test. Transthoracic echocardiography for left ventricular function evaluation, for right ventricular function evaluation, and for assessment of valvular function. Image quality was adequate.  Study completion:  There were no complications.          Echocardiography.  M-mode, complete 2D, spectral Doppler, and color Doppler.  Birthdate:  Patient birthdate: Dec 20, 1950.  Age:  Patient is 66 yr old.  Sex:  Gender: male.    BMI: 29.8 kg/m^2.  Blood pressure:     136/74  Patient status:  Outpatient.  Study date:  Study date: 09/20/2015. Study time: 10:58 AM.  Location:  [facility]  -------------------------------------------------------------------  ------------------------------------------------------------------- Left ventricle:  The cavity size was normal. There was moderate focal basal hypertrophy of the septum. Systolic function was normal. The estimated ejection fraction was in the range of 60% to 65%. Wall motion was normal; there were no regional wall motion abnormalities. Doppler parameters are consistent with abnormal left ventricular relaxation (grade 1 diastolic dysfunction).  ------------------------------------------------------------------- Aortic valve:   Trileaflet; normal thickness leaflets. Mobility was not restricted.  Doppler:  Transvalvular velocity was within the normal range. There was no stenosis. There was moderate to severe regurgitation.  ------------------------------------------------------------------- Aorta:  Ascending aorta: The ascending aorta was mildly dilated.  ------------------------------------------------------------------- Mitral valve:   Structurally normal valve.   Mobility was not restricted.   Doppler:  Transvalvular velocity was within the normal range. There was no evidence for stenosis. There was no regurgitation.  ------------------------------------------------------------------- Left atrium:  The atrium was normal in size.  ------------------------------------------------------------------- Right ventricle:  The cavity size was mildly dilated. Wall thickness was normal. Systolic function was normal.  ------------------------------------------------------------------- Pulmonic valve:    Doppler:  Transvalvular velocity was within the normal range. There was no evidence for stenosis.  ------------------------------------------------------------------- Tricuspid valve:   Structurally normal valve.    Doppler: Transvalvular velocity was within the normal range. There was trivial regurgitation.  ------------------------------------------------------------------- Pulmonary artery:   The main pulmonary artery was normal-sized. Systolic pressure was within the normal range.  ------------------------------------------------------------------- Right atrium:  The atrium was normal in size.  ------------------------------------------------------------------- Pericardium:  There was no pericardial effusion.  ------------------------------------------------------------------- Systemic veins: Inferior vena cava: The vessel was normal in size.  ------------------------------------------------------------------- Measurements   Left ventricle                           Value        Reference  LV ID, ED, PLAX chordal  50.9  mm     43 - 52  LV ID, ES, PLAX chordal                  34.6  mm     23 - 38  LV fx shortening, PLAX chordal           32    %      >=29  LV PW thickness, ED                      10.9  mm     ---------  IVS/LV PW ratio, ED              (H)     1.39         <=1.3  Stroke volume, 2D                        124   ml     ---------   Stroke volume/bsa, 2D                    58    ml/m^2 ---------  LV e&', lateral                           5.26  cm/s   ---------  LV E/e&', lateral                         7.7          ---------  LV e&', medial                            5.55  cm/s   ---------  LV E/e&', medial                          7.3          ---------  LV e&', average                           5.41  cm/s   ---------  LV E/e&', average                         7.49         ---------    Ventricular septum                       Value        Reference  IVS thickness, ED                        15.2  mm     ---------    LVOT                                     Value        Reference  LVOT ID, S                               30    mm     ---------  LVOT area  7.07  cm^2   ---------  LVOT ID                                  30    mm     ---------  LVOT peak velocity, S                    76.7  cm/s   ---------  LVOT mean velocity, S                    53.7  cm/s   ---------  LVOT VTI, S                              17.5  cm     ---------  LVOT peak gradient, S                    2     mm Hg  ---------  Stroke volume (SV), LVOT DP              123.7 ml     ---------  Stroke index (SV/bsa), LVOT DP           57.9  ml/m^2 ---------    Aortic valve                             Value        Reference  Aortic regurg pressure half-time         916   ms     ---------    Aorta                                    Value        Reference  Aortic root ID, ED                       43    mm     ---------    Left atrium                              Value        Reference  LA ID, A-P, ES                           33    mm     ---------  LA ID/bsa, A-P                           1.54  cm/m^2 <=2.2  LA volume, S                             37    ml     ---------  LA volume/bsa, S                         17.3  ml/m^2 ---------  LA volume, ES, 1-p A4C                   31  ml     ---------  LA volume/bsa, ES,  1-p A4C               14.5  ml/m^2 ---------  LA volume, ES, 1-p A2C                   44    ml     ---------  LA volume/bsa, ES, 1-p A2C               20.6  ml/m^2 ---------    Mitral valve                             Value        Reference  Mitral E-wave peak velocity              40.5  cm/s   ---------  Mitral A-wave peak velocity              57.8  cm/s   ---------  Mitral deceleration time         (H)     257   ms     150 - 230  Mitral E/A ratio, peak                   0.7          ---------    Tricuspid valve                          Value        Reference  Tricuspid regurg peak velocity           251   cm/s   ---------  Tricuspid peak RV-RA gradient            25    mm Hg  ---------    Right ventricle                          Value        Reference  RV s&', lateral, S                        18    cm/s   ---------  Legend: (L)  and  (H)  mark values outside specified reference range.  ------------------------------------------------------------------- Prepared and Electronically Authenticated by  Candee Furbish, M.D. 2017-08-25T13:27:13  There is  moderate to severe regurgitation On echo  EF 60- 65%   LV ID, ED, PLAX chordal                  50.9  mm     43 - 52  LV ID, ES, PLAX chordal                  34.6  mm     23 - 38      Recent Lab Findings:    Aortic Size Index=      5.2  /Body surface area is 2.08 meters squared. = 2.5  < 2.75 cm/m2      4% risk per year 2.75 to 4.25          8% risk per year > 4.25 cm/m2    20% risk per year    Assessment / Plan:   Dilated ascending aorta and aortic root, mild murmur of aortic insufficiency. The patient is asymptomatic  as far as aortic insufficiency is concerned.   I've referred the patient to Dr. Dorothyann Gibbs for dental clearance, patient does have significant dental root problems and poor dentition overall I've asked him to return to see Dr. Martinique to consider cardiac clearance for hip replacement.  I anticipate that  sometime in the next 8-12 months he should proceed with aortic root replacement depending on the degree of aortic insufficiency and symptoms. He has not had a cardiac catheterization, which he  will need prior to root replacement.   Could consider proceeding in the near future with cardiac catheterization both to clear him for possible hip replacement and have information available for aortic root and ascending aortic replacement.   I'll see him back after his been seen by Dr. Dorothyann Gibbs and Dr. Martinique.   I  Grace Isaac MD      [address] [place] [address] Office [phone]   Beeper [phone]  03/30/2016 7:12 PM

## 2016-03-30 ENCOUNTER — Telehealth: Payer: Self-pay

## 2016-03-30 NOTE — Telephone Encounter (Signed)
Called patient left message on personal voice mail Dr.Jordan received a message from [place] about needing  a cardiac cath before thoracic aneurysm surgery.Appointment scheduled with Dr.Jordan Wed 04/01/16 at 2:45 pm.Advised to call me back to let me know you received this message.

## 2016-03-31 NOTE — Progress Notes (Signed)
Cardiology Office Note    Date:  04/01/2016   ID:  Lonnie Hansen, DOB October 31, 1950, MRN [id number]  PCP:  [name], MD  Cardiologist:  Peter Martinique, MD    History of Present Illness:  Lonnie Hansen is a 65 y.o. male seen for follow up  of thoracic aortic aneurysm. He has a history of HLD and HTN. In 2012 he was admitted with a syncopal spell. He ruled out for MI. He was hypokalemic. He had a follow up stress test as an outpatient with Dr. Debara Pickett that was apparently OK. More recently he was planning to have hip replacement. CXR showed aortic enlargement. He then underwent CT that showed dilation of the proximal aorta. He has been seen by Dr. Servando Snare. CT of the aorta showed mild aortic root enlargement with maximal diameter 4.5 cm. The descending aorta was normal. He denies any chest pain or SOB. He had an Echocardiogram that showed normal LV function and moderate to severe AI. Carotid dopplers showed mild nonobstructive disease.   He is limited in activity due to severe  hip pain. He reports he need  hip replacement surgery. He has to walk with a cane. On recent evaluation with Dr. Servando Snare CT showed aortic root enlargement to 5.2 cm. Given size of root and degree of AI it is recommended that he undergo Aortic root grafting and AVR in next 8-12 months. He has chronic glaucoma. 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 No dizziness, palpitations, or syncope since 2012. He is a Therapist, nutritional. He denies any chest pain or dyspnea.    Past Medical History:  Diagnosis Date  . Anemia   . Asymptomatic bilateral carotid artery stenosis 08/2015   1-39%   . Forgetfulness   . GI bleed   . HLD (hyperlipidemia)   . Hypertension   . Insomnia   . OA (osteoarthritis) of hip     History reviewed. No pertinent surgical history.  Current Medications: Outpatient Medications Prior to Visit  Medication Sig Dispense Refill  . amLODipine (NORVASC) 10 MG tablet  Take 10 mg by mouth daily.    . Aspirin-Acetaminophen (GOODY BODY PAIN) 500-325 MG PACK Take by mouth 2 (two) times daily as needed (for hip pain).    Marland Kitchen atenolol (TENORMIN) 25 MG tablet Take 12.5 mg by mouth daily.     . bimatoprost (LUMIGAN) 0.01 % SOLN Place 1 drop into both eyes at bedtime.    Marland Kitchen HYDROcodone-homatropine (HYCODAN) 5-1.5 MG/5ML syrup Take 5 mLs by mouth every 4 (four) hours as needed for cough.    . pantoprazole (PROTONIX) 40 MG tablet Take 40 mg by mouth daily.     No facility-administered medications prior to visit.      Allergies:   Patient has no known allergies.   Social History   Social History  . Marital status: Married    Spouse name: N/A  . Number of children: N/A  . Years of education: N/A   Social History Main Topics  . Smoking status: Former Smoker    Packs/day: 1.00    Years: 25.00    Types: Cigarettes  . Smokeless tobacco: Never Used  . Alcohol use No  . Drug use: No  . Sexual activity: Not Asked   Other Topics Concern  . None   Social History Narrative   Married.   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  Family History:  The patient's family history includes Heart attack in his father; Heart disease in his father; Thyroid disease in his sister.   ROS:   Please see the history of present illness.    ROS All other systems reviewed and are negative.   PHYSICAL EXAM:   VS:  BP 128/62   Pulse 60   Ht 5\' 9"  (1.753 m)   Wt 197 lb (89.4 kg)   BMI 29.09 kg/m    GEN: Well nourished, well developed, in no acute distress  HEENT: normal, voice is hoarse.  Neck: no JVD, carotid bruits, or masses Cardiac: RRR; soft 1/6  Diastolic murmur LSB.  Respiratory:  clear to auscultation bilaterally, normal work of breathing GI: soft, nontender, nondistended, + BS MS: no deformity or atrophy  Skin: warm and dry, no rash Neuro:  Alert and Oriented x 3, Strength and sensation are intact Psych: euthymic mood, full affect  Wt Readings from Last  3 Encounters:  04/01/16 197 lb (89.4 kg)  03/26/16 195 lb (88.5 kg)  09/26/15 200 lb (90.7 kg)      Studies/Labs Reviewed:   EKG:  EKG is not ordered today.  The ekg ordered today demonstrates N/A   Recent Labs: 08/28/2015: Creatinine, Ser 0.90   Lipid Panel    Component Value Date/Time   CHOL  04/28/2010 0703    187        ATP III CLASSIFICATION:  <200     mg/dL   Desirable  200-239  mg/dL   Borderline High  >=240    mg/dL   High          TRIG 69 04/28/2010 0703   HDL 54 04/28/2010 0703   CHOLHDL 3.5 04/28/2010 0703   VLDL 14 04/28/2010 0703   LDLCALC (H) 04/28/2010 0703    119        Total Cholesterol/HDL:CHD Risk Coronary Heart Disease Risk Table                     Men   Women  1/2 Average Risk   3.4   3.3  Average Risk       5.0   4.4  2 X Average Risk   9.6   7.1  3 X Average Risk  23.4   11.0        Use the calculated Patient Ratio above and the CHD Risk Table to determine the patient's CHD Risk.        ATP III CLASSIFICATION (LDL):  <100     mg/dL   Optimal  100-129  mg/dL   Near or Above                    Optimal  130-159  mg/dL   Borderline  160-189  mg/dL   High  >190     mg/dL   Very High   Echo 09/20/15: Study Conclusions  - Left ventricle: The cavity size was normal. There was moderate   focal basal hypertrophy of the septum. Systolic function was   normal. The estimated ejection fraction was in the range of 60%   to 65%. Wall motion was normal; there were no regional wall   motion abnormalities. Doppler parameters are consistent with   abnormal left ventricular relaxation (grade 1 diastolic   dysfunction). - Aortic valve: There was moderate to severe regurgitation. - Aorta: Aortic root dimension: 43 mm (ED). - Ascending aorta: The ascending aorta was mildly dilated. - Right  ventricle: The cavity size was mildly dilated. Wall   thickness was normal.   ADDENDUM REPORT: 03/29/2016 14:08  ADDENDUM: The aortic root at the level of the  sinuses of Valsalva was remeasured at approximately 4.9- 5.2 cm in greatest diameter depending on axis of measurement. This appears stable compared to the prior coronary CTA on 08/28/2015.   Electronically Signed   By: Aletta Edouard M.D.   On: 03/29/2016 14:08   Addended by Aletta Edouard, MD on 03/29/2016 2:11 PM    Study Result   CLINICAL DATA:  Thoracic aortic aneurysm without rupture.  EXAM: CT ANGIOGRAPHY CHEST WITH CONTRAST  TECHNIQUE: Multidetector CT imaging of the chest was performed using the standard protocol during bolus administration of intravenous contrast. Multiplanar CT image reconstructions and MIPs were obtained to evaluate the vascular anatomy.  CONTRAST:  75 mL of Isovue 370 intravenously.  COMPARISON:  CT scan of July 24, 2015.  FINDINGS: Cardiovascular: There is no evidence of thoracic aortic dissection. Great vessels are widely patent without significant stenosis. Aneurysmal aortic root is noted measured at 5.6 cm. Proximal ascending thoracic aorta measures 4.1 cm in diameter. Transverse aortic arch measures 4.0 cm. Descending thoracic aorta has maximum measured diameter of 3.9 cm. Coronary artery calcifications are noted.  Mediastinum/Nodes: No enlarged mediastinal, hilar, or axillary lymph nodes. Thyroid gland, trachea, and esophagus demonstrate no significant findings.  Lungs/Pleura: Lungs are clear. No pleural effusion or pneumothorax.  Upper Abdomen: Large gallstone is noted. Stable partially calcified left renal cyst is noted.  Musculoskeletal: No chest wall abnormality. No acute or significant osseous findings.  Review of the MIP images confirms the above findings.  IMPRESSION: Stable aneurysmal dilatation of aortic root is noted at 5.6 cm. 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 Recommend annual imaging followup by CTA or MRA. This recommendation follows 2010  ACCF/AHA/AATS/ACR/ASA/SCA/SCAI/SIR/STS/SVM Guidelines for the Diagnosis and Management of Patients with Thoracic Aortic Disease. Circulation. 2010; 121: U235-T614.  Coronary artery calcifications are noted suggesting coronary disease.  Large gallstone is noted.  Electronically Signed: By: Marijo Conception, M.D. On: 03/26/2016 15:37          ASSESSMENT:    1. Thoracic aortic aneurysm without rupture ([place])   2. Essential hypertension   3. Nonrheumatic aortic valve insufficiency      PLAN:  In order of problems listed above:  1. Patient has thoracic aortic aneurysm measuring 5.2 cm. 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 He is stable with no clear cardiac symptoms. BP is well controlled. We discussed his diagnosis and the recommended therapy for aortic grafting and probable AVR. He was referred to Dr Enrique Sack by Dr Servando Snare for dental evaluation. Will go ahead and arrange right and left heart cath/coronary angiogram on March 22. The procedure and risks were reviewed including but not limited to death, myocardial infarction, stroke, arrythmias, bleeding, transfusion, emergency surgery, dye allergy, or renal dysfunction. The patient voices understanding and is agreeable to proceed. 2. Severe osteoarthritis of the hip. Will need hip replacement surgery. We had a long discussion about this. He is stable from a cardiac standpoint to have hip surgery. I explained that he could have hip surgery now and his heart surgery in a few months after recovery or he could have heart surgery first and hip surgery later. He is much more concerned about his heart situation now especially since other family members have died with heart issues in the past. He would like to go ahead  and proceed with cardiac cath next and then decide.  3. HTN controlled.     Medication Adjustments/Labs and Tests Ordered: Current medicines are reviewed at length with the patient today.   Concerns regarding medicines are outlined above.  Medication changes, Labs and Tests ordered today are listed in the Patient Instructions below. Patient Instructions  We will schedule you for a cardiac cath procedure on March 22.     Signed, Peter Martinique, MD  04/01/2016 5:17 PM    [facility] [address] [place], [place], [address] [phone]

## 2016-04-01 ENCOUNTER — Encounter: Payer: Self-pay | Admitting: Cardiology

## 2016-04-01 ENCOUNTER — Other Ambulatory Visit: Payer: Self-pay | Admitting: Cardiology

## 2016-04-01 ENCOUNTER — Ambulatory Visit (INDEPENDENT_AMBULATORY_CARE_PROVIDER_SITE_OTHER): Payer: Medicare Other | Admitting: Cardiology

## 2016-04-01 VITALS — BP 128/62 | HR 60 | Ht 69.0 in | Wt 197.0 lb

## 2016-04-01 DIAGNOSIS — I351 Nonrheumatic aortic (valve) insufficiency: Secondary | ICD-10-CM

## 2016-04-01 DIAGNOSIS — I712 Thoracic aortic aneurysm, without rupture, unspecified: Secondary | ICD-10-CM

## 2016-04-01 DIAGNOSIS — I10 Essential (primary) hypertension: Secondary | ICD-10-CM | POA: Diagnosis not present

## 2016-04-01 NOTE — Patient Instructions (Addendum)
We will schedule you for a cardiac cath procedure on March 22.

## 2016-04-06 ENCOUNTER — Telehealth (HOSPITAL_COMMUNITY): Payer: Self-pay | Admitting: Dentistry

## 2016-04-06 NOTE — Telephone Encounter (Signed)
04/06/16    Called and spoke w/pt. regarding scheduling Dental Consult w/Dr. Enrique Sack.  Pt. unable to schedule this week due to work schedule.  Will call bk at a later date to schedule.  LRI

## 2016-04-10 DIAGNOSIS — I351 Nonrheumatic aortic (valve) insufficiency: Secondary | ICD-10-CM | POA: Diagnosis not present

## 2016-04-10 DIAGNOSIS — I10 Essential (primary) hypertension: Secondary | ICD-10-CM | POA: Diagnosis not present

## 2016-04-10 DIAGNOSIS — I712 Thoracic aortic aneurysm, without rupture: Secondary | ICD-10-CM | POA: Diagnosis not present

## 2016-04-10 LAB — CBC WITH DIFFERENTIAL/PLATELET
Basophils Absolute: 0 cells/uL (ref 0–200)
Basophils Relative: 0 %
EOS PCT: 3 %
Eosinophils Absolute: 225 cells/uL (ref 15–500)
HCT: 39.1 % (ref 38.5–50.0)
Hemoglobin: 13.2 g/dL (ref 13.2–17.1)
LYMPHS ABS: 2025 {cells}/uL (ref 850–3900)
LYMPHS PCT: 27 %
MCH: 30.8 pg (ref 27.0–33.0)
MCHC: 33.8 g/dL (ref 32.0–36.0)
MCV: 91.1 fL (ref 80.0–100.0)
MPV: 9.5 fL (ref 7.5–12.5)
Monocytes Absolute: 825 cells/uL (ref 200–950)
Monocytes Relative: 11 %
NEUTROS PCT: 59 %
Neutro Abs: 4425 cells/uL (ref 1500–7800)
Platelets: 104 10*3/uL — ABNORMAL LOW (ref 140–400)
RBC: 4.29 MIL/uL (ref 4.20–5.80)
RDW: 14.7 % (ref 11.0–15.0)
WBC: 7.5 10*3/uL (ref 3.8–10.8)

## 2016-04-10 LAB — BASIC METABOLIC PANEL
BUN: 11 mg/dL (ref 7–25)
CALCIUM: 9.1 mg/dL (ref 8.6–10.3)
CO2: 26 mmol/L (ref 20–31)
Chloride: 103 mmol/L (ref 98–110)
Creat: 0.75 mg/dL (ref 0.70–1.25)
Glucose, Bld: 97 mg/dL (ref 65–99)
Potassium: 3.8 mmol/L (ref 3.5–5.3)
SODIUM: 139 mmol/L (ref 135–146)

## 2016-04-11 LAB — PROTIME-INR
INR: 1
PROTHROMBIN TIME: 11 s (ref 9.0–11.5)

## 2016-04-16 ENCOUNTER — Encounter (HOSPITAL_COMMUNITY)
Admission: RE | Disposition: A | Discharge status: Home / Self Care | Payer: Self-pay | Source: Ambulatory Visit | Attending: Cardiology

## 2016-04-16 ENCOUNTER — Ambulatory Visit (HOSPITAL_COMMUNITY)
Admission: RE | Admit: 2016-04-16 | Discharge: 2016-04-16 | Disposition: A | Discharge status: Home / Self Care | Payer: Medicare Other | Source: Ambulatory Visit | Attending: Cardiology | Admitting: Cardiology

## 2016-04-16 DIAGNOSIS — Z8249 Family history of ischemic heart disease and other diseases of the circulatory system: Secondary | ICD-10-CM | POA: Diagnosis not present

## 2016-04-16 DIAGNOSIS — I10 Essential (primary) hypertension: Secondary | ICD-10-CM | POA: Diagnosis not present

## 2016-04-16 DIAGNOSIS — I712 Thoracic aortic aneurysm, without rupture, unspecified: Secondary | ICD-10-CM | POA: Diagnosis present

## 2016-04-16 DIAGNOSIS — I2510 Atherosclerotic heart disease of native coronary artery without angina pectoris: Secondary | ICD-10-CM | POA: Diagnosis not present

## 2016-04-16 DIAGNOSIS — Z87891 Personal history of nicotine dependence: Secondary | ICD-10-CM | POA: Insufficient documentation

## 2016-04-16 DIAGNOSIS — E785 Hyperlipidemia, unspecified: Secondary | ICD-10-CM | POA: Diagnosis not present

## 2016-04-16 DIAGNOSIS — I6523 Occlusion and stenosis of bilateral carotid arteries: Secondary | ICD-10-CM | POA: Diagnosis not present

## 2016-04-16 DIAGNOSIS — Z7982 Long term (current) use of aspirin: Secondary | ICD-10-CM | POA: Diagnosis not present

## 2016-04-16 DIAGNOSIS — G4700 Insomnia, unspecified: Secondary | ICD-10-CM | POA: Diagnosis present

## 2016-04-16 DIAGNOSIS — K8020 Calculus of gallbladder without cholecystitis without obstruction: Secondary | ICD-10-CM | POA: Insufficient documentation

## 2016-04-16 DIAGNOSIS — M1610 Unilateral primary osteoarthritis, unspecified hip: Secondary | ICD-10-CM | POA: Diagnosis not present

## 2016-04-16 DIAGNOSIS — I351 Nonrheumatic aortic (valve) insufficiency: Secondary | ICD-10-CM | POA: Diagnosis present

## 2016-04-16 HISTORY — PX: RIGHT/LEFT HEART CATH AND CORONARY ANGIOGRAPHY: CATH118266

## 2016-04-16 HISTORY — PX: AORTIC ARCH ANGIOGRAPHY: CATH118224

## 2016-04-16 SURGERY — RIGHT/LEFT HEART CATH AND CORONARY ANGIOGRAPHY
Anesthesia: LOCAL

## 2016-04-16 MED ORDER — LIDOCAINE HCL (PF) 1 % IJ SOLN
INTRAMUSCULAR | Status: AC
Start: 2016-04-16 — End: 2016-04-16
  Filled 2016-04-16: qty 30

## 2016-04-16 MED ORDER — IOPAMIDOL (ISOVUE-370) INJECTION 76%
INTRAVENOUS | Status: AC
  Filled 2016-04-16: qty 50

## 2016-04-16 MED ORDER — HEPARIN SODIUM (PORCINE) 1000 UNIT/ML IJ SOLN
INTRAMUSCULAR | Status: AC
  Filled 2016-04-16: qty 1

## 2016-04-16 MED ORDER — MIDAZOLAM HCL 2 MG/2ML IJ SOLN
INTRAMUSCULAR | Status: DC | PRN
  Administered 2016-04-16: 2 mg via INTRAVENOUS

## 2016-04-16 MED ORDER — ACETAMINOPHEN 325 MG PO TABS
650.0000 mg | ORAL_TABLET | Freq: Four times a day (QID) | ORAL | Status: AC | PRN
  Administered 2016-04-16: 650 mg via ORAL
  Filled 2016-04-16: qty 2

## 2016-04-16 MED ORDER — LIDOCAINE HCL (PF) 1 % IJ SOLN
INTRAMUSCULAR | Status: DC | PRN
  Administered 2016-04-16 (×2): 2 mL

## 2016-04-16 MED ORDER — ACETAMINOPHEN 325 MG PO TABS
ORAL_TABLET | ORAL | Status: AC
  Filled 2016-04-16: qty 1

## 2016-04-16 MED ORDER — HEPARIN SODIUM (PORCINE) 1000 UNIT/ML IJ SOLN
INTRAMUSCULAR | Status: DC | PRN
  Administered 2016-04-16: 4500 [IU] via INTRAVENOUS

## 2016-04-16 MED ORDER — SODIUM CHLORIDE 0.9 % WEIGHT BASED INFUSION: 1.0000 mL/kg/h | INTRAVENOUS | Status: DC

## 2016-04-16 MED ORDER — SODIUM CHLORIDE 0.9% FLUSH: 3.0000 mL | Freq: Two times a day (BID) | INTRAVENOUS | Status: DC

## 2016-04-16 MED ORDER — FENTANYL CITRATE (PF) 100 MCG/2ML IJ SOLN
INTRAMUSCULAR | Status: DC | PRN
  Administered 2016-04-16: 25 ug via INTRAVENOUS

## 2016-04-16 MED ORDER — VERAPAMIL HCL 2.5 MG/ML IV SOLN
INTRAVENOUS | Status: DC | PRN
  Administered 2016-04-16: 10 mL via INTRA_ARTERIAL

## 2016-04-16 MED ORDER — SODIUM CHLORIDE 0.9 % WEIGHT BASED INFUSION: 3.0000 mL/kg/h | INTRAVENOUS | Status: DC

## 2016-04-16 MED ORDER — MIDAZOLAM HCL 2 MG/2ML IJ SOLN
INTRAMUSCULAR | Status: AC
  Filled 2016-04-16: qty 2

## 2016-04-16 MED ORDER — VERAPAMIL HCL 2.5 MG/ML IV SOLN
INTRAVENOUS | Status: AC
  Filled 2016-04-16: qty 2

## 2016-04-16 MED ORDER — HEPARIN (PORCINE) IN NACL 2-0.9 UNIT/ML-% IJ SOLN
INTRAMUSCULAR | Status: DC | PRN
  Administered 2016-04-16: 1000 mL

## 2016-04-16 MED ORDER — IOPAMIDOL (ISOVUE-370) INJECTION 76%
INTRAVENOUS | Status: DC | PRN
  Administered 2016-04-16: 135 mL via INTRA_ARTERIAL

## 2016-04-16 MED ORDER — SODIUM CHLORIDE 0.9% FLUSH: 3.0000 mL | INTRAVENOUS | Status: DC | PRN

## 2016-04-16 MED ORDER — SODIUM CHLORIDE 0.9 % IV SOLN: 250.0000 mL | INTRAVENOUS | Status: DC | PRN

## 2016-04-16 MED ORDER — FENTANYL CITRATE (PF) 100 MCG/2ML IJ SOLN
INTRAMUSCULAR | Status: AC
  Filled 2016-04-16: qty 2

## 2016-04-16 MED ORDER — HEPARIN (PORCINE) IN NACL 2-0.9 UNIT/ML-% IJ SOLN
INTRAMUSCULAR | Status: AC
  Filled 2016-04-16: qty 1000

## 2016-04-16 MED ORDER — SODIUM CHLORIDE 0.9 % WEIGHT BASED INFUSION: 1.0000 mL/kg/h | INTRAVENOUS | Status: AC

## 2016-04-16 SURGICAL SUPPLY — 12 items
CATH BALLN WEDGE 5F 110CM (CATHETERS) ×1 IMPLANT
CATH INFINITI 5FR MULTPACK ANG (CATHETERS) ×1 IMPLANT
DEVICE RAD COMP TR BAND LRG (VASCULAR PRODUCTS) ×1 IMPLANT
GLIDESHEATH SLEND SS 6F .021 (SHEATH) ×1 IMPLANT
GUIDEWIRE INQWIRE 1.5J.035X260 (WIRE) IMPLANT
INQWIRE 1.5J .035X260CM (WIRE) ×2
KIT HEART LEFT (KITS) ×2 IMPLANT
PACK CARDIAC CATHETERIZATION (CUSTOM PROCEDURE TRAY) ×2 IMPLANT
SHEATH FAST CATH BRACH 5F 5CM (SHEATH) ×1 IMPLANT
SYR MEDRAD MARK V 150ML (SYRINGE) ×2 IMPLANT
TRANSDUCER W/STOPCOCK (MISCELLANEOUS) ×2 IMPLANT
TUBING CIL FLEX 10 FLL-RA (TUBING) ×2 IMPLANT

## 2016-04-16 NOTE — Interval H&P Note (Signed)
History and Physical Interval Note:  04/16/2016 9:07 AM  Lonnie Hansen  has presented today for surgery, with the diagnosis of surgical clearance  The various methods of treatment have been discussed with the patient and family. After consideration of risks, benefits and other options for treatment, the patient has consented to  Procedure(s): Right/Left Heart Cath and Coronary Angiography (N/A) as a surgical intervention .  The patient's history has been reviewed, patient examined, no change in status, stable for surgery.  I have reviewed the patient's chart and labs.  Questions were answered to the patient's satisfaction.     Collier [facility] 04/16/2016 9:07 AM

## 2016-04-16 NOTE — Discharge Instructions (Signed)
Radial Site Care °Refer to this sheet in the next few weeks. These instructions provide you with information about caring for yourself after your procedure. Your health care provider may also give you more specific instructions. Your treatment has been planned according to current medical practices, but problems sometimes occur. Call your health care provider if you have any problems or questions after your procedure. °What can I expect after the procedure? °After your procedure, it is typical to have the following: °· Bruising at the radial site that usually fades within 1-2 weeks. °· Blood collecting in the tissue (hematoma) that may be painful to the touch. It should usually decrease in size and tenderness within 1-2 weeks. °Follow these instructions at home: °· Take medicines only as directed by your health care provider. °· You may shower 24-48 hours after the procedure or as directed by your health care provider. Remove the bandage (dressing) and gently wash the site with plain soap and water. Pat the area dry with a clean towel. Do not rub the site, because this may cause bleeding. °· Do not take baths, swim, or use a hot tub until your health care provider approves. °· Check your insertion site every day for redness, swelling, or drainage. °· Do not apply powder or lotion to the site. °· Do not flex or bend the affected arm for 24 hours or as directed by your health care provider. °· Do not push or pull heavy objects with the affected arm for 24 hours or as directed by your health care provider. °· Do not lift over 10 lb (4.5 kg) for 5 days after your procedure or as directed by your health care provider. °· Ask your health care provider when it is okay to: °¨ Return to work or school. °¨ Resume usual physical activities or sports. °¨ Resume sexual activity. °· Do not drive home if you are discharged the same day as the procedure. Have someone else drive you. °· You may drive 24 hours after the procedure  unless otherwise instructed by your health care provider. °· Do not operate machinery or power tools for 24 hours after the procedure. °· If your procedure was done as an outpatient procedure, which means that you went home the same day as your procedure, a responsible adult should be with you for the first 24 hours after you arrive home. °· Keep all follow-up visits as directed by your health care provider. This is important. °Contact a health care provider if: °· You have a fever. °· You have chills. °· You have increased bleeding from the radial site. Hold pressure on the site. °Get help right away if: °· You have unusual pain at the radial site. °· You have redness, warmth, or swelling at the radial site. °· You have drainage (other than a small amount of blood on the dressing) from the radial site. °· The radial site is bleeding, and the bleeding does not stop after 30 minutes of holding steady pressure on the site. °· Your arm or hand becomes pale, cool, tingly, or numb. °This information is not intended to replace advice given to you by your health care provider. Make sure you discuss any questions you have with your health care provider. °Document Released: 02/14/2010 Document Revised: 06/20/2015 Document Reviewed: 07/31/2013 °Elsevier Interactive Patient Education © 2017 [employer] ° °

## 2016-04-16 NOTE — H&P (View-Only) (Signed)
Cardiology Office Note    Date:  04/01/2016   ID:  Lonnie Hansen, DOB November 02, 1950, MRN [id number]  PCP:  Lonnie Lopes, MD  Cardiologist:  Lonnie Martinique, MD    History of Present Illness:  Lonnie Hansen is a 66 y.o. male seen for follow up  of thoracic aortic aneurysm. He has a history of HLD and HTN. In 2012 he was admitted with a syncopal spell. He ruled out for MI. He was hypokalemic. He had a follow up stress test as an outpatient with Dr. Debara Hansen that was apparently OK. More recently he was planning to have hip replacement. CXR showed aortic enlargement. He then underwent CT that showed dilation of the proximal aorta. He has been seen by Dr. Servando Hansen. CT of the aorta showed mild aortic root enlargement with maximal diameter 4.5 cm. The descending aorta was normal. He denies any chest pain or SOB. He had an Echocardiogram that showed normal LV function and moderate to severe AI. Carotid dopplers showed mild nonobstructive disease.   He is limited in activity due to severe  hip pain. He reports he need  hip replacement surgery. He has to walk with a cane. On recent evaluation with Dr. Servando Hansen CT showed aortic root enlargement to 5.2 cm. Given size of root and degree of AI it is recommended that he undergo Aortic root grafting and AVR in next 8-12 months. He has chronic glaucoma. 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 No dizziness, palpitations, or syncope since 2012. He is a Therapist, nutritional. He denies any chest pain or dyspnea.    Past Medical History:  Diagnosis Date  . Anemia   . Asymptomatic bilateral carotid artery stenosis 08/2015   1-39%   . Forgetfulness   . GI bleed   . HLD (hyperlipidemia)   . Hypertension   . Insomnia   . OA (osteoarthritis) of hip     History reviewed. No pertinent surgical history.  Current Medications: Outpatient Medications Prior to Visit  Medication Sig Dispense Refill  . amLODipine (NORVASC) 10 MG tablet  Take 10 mg by mouth daily.    . Aspirin-Acetaminophen (GOODY BODY PAIN) 500-325 MG PACK Take by mouth 2 (two) times daily as needed (for hip pain).    Marland Kitchen atenolol (TENORMIN) 25 MG tablet Take 12.5 mg by mouth daily.     . bimatoprost (LUMIGAN) 0.01 % SOLN Place 1 drop into both eyes at bedtime.    Marland Kitchen HYDROcodone-homatropine (HYCODAN) 5-1.5 MG/5ML syrup Take 5 mLs by mouth every 4 (four) hours as needed for cough.    . pantoprazole (PROTONIX) 40 MG tablet Take 40 mg by mouth daily.     No facility-administered medications prior to visit.      Allergies:   Patient has no known allergies.   Social History   Social History  . Marital status: Married    Spouse name: N/A  . Number of children: N/A  . Years of education: N/A   Social History Main Topics  . Smoking status: Former Smoker    Packs/day: 1.00    Years: 25.00    Types: Cigarettes  . Smokeless tobacco: Never Used  . Alcohol use No  . Drug use: No  . Sexual activity: Not Asked   Other Topics Concern  . None   Social History Narrative   Married.   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  Family History:  The patient's family history includes Heart attack in his father; Heart disease in his father; Thyroid disease in his sister.   ROS:   Please see the history of present illness.    ROS All other systems reviewed and are negative.   PHYSICAL EXAM:   VS:  BP 128/62   Pulse 60   Ht 5\' 9"  (1.753 m)   Wt 197 lb (89.4 kg)   BMI 29.09 kg/m    GEN: Well nourished, well developed, in no acute distress  HEENT: normal, voice is hoarse.  Neck: no JVD, carotid bruits, or masses Cardiac: RRR; soft 1/6  Diastolic murmur LSB.  Respiratory:  clear to auscultation bilaterally, normal work of breathing GI: soft, nontender, nondistended, + BS MS: no deformity or atrophy  Skin: warm and dry, no rash Neuro:  Alert and Oriented x 3, Strength and sensation are intact Psych: euthymic mood, full affect  Wt Readings from Last  3 Encounters:  04/01/16 197 lb (89.4 kg)  03/26/16 195 lb (88.5 kg)  09/26/15 200 lb (90.7 kg)      Studies/Labs Reviewed:   EKG:  EKG is not ordered today.  The ekg ordered today demonstrates N/A   Recent Labs: 08/28/2015: Creatinine, Ser 0.90   Lipid Panel    Component Value Date/Time   CHOL  04/28/2010 0703    187        ATP III CLASSIFICATION:  <200     mg/dL   Desirable  200-239  mg/dL   Borderline High  >=240    mg/dL   High          TRIG 69 04/28/2010 0703   HDL 54 04/28/2010 0703   CHOLHDL 3.5 04/28/2010 0703   VLDL 14 04/28/2010 0703   LDLCALC (H) 04/28/2010 0703    119        Total Cholesterol/HDL:CHD Risk Coronary Heart Disease Risk Table                     Men   Women  1/2 Average Risk   3.4   3.3  Average Risk       5.0   4.4  2 X Average Risk   9.6   7.1  3 X Average Risk  23.4   11.0        Use the calculated Patient Ratio above and the CHD Risk Table to determine the patient's CHD Risk.        ATP III CLASSIFICATION (LDL):  <100     mg/dL   Optimal  100-129  mg/dL   Near or Above                    Optimal  130-159  mg/dL   Borderline  160-189  mg/dL   High  >190     mg/dL   Very High   Echo 09/20/15: Study Conclusions  - Left ventricle: The cavity size was normal. There was moderate   focal basal hypertrophy of the septum. Systolic function was   normal. The estimated ejection fraction was in the range of 60%   to 65%. Wall motion was normal; there were no regional wall   motion abnormalities. Doppler parameters are consistent with   abnormal left ventricular relaxation (grade 1 diastolic   dysfunction). - Aortic valve: There was moderate to severe regurgitation. - Aorta: Aortic root dimension: 43 mm (ED). - Ascending aorta: The ascending aorta was mildly dilated. - Right  ventricle: The cavity size was mildly dilated. Wall   thickness was normal.   ADDENDUM REPORT: 03/29/2016 14:08  ADDENDUM: The aortic root at the level of the  sinuses of Valsalva was remeasured at approximately 4.9- 5.2 cm in greatest diameter depending on axis of measurement. This appears stable compared to the prior coronary CTA on 08/28/2015.   Electronically Signed   By: Aletta Edouard M.D.   On: 03/29/2016 14:08   Addended by Aletta Edouard, MD on 03/29/2016 2:11 PM    Study Result   CLINICAL DATA:  Thoracic aortic aneurysm without rupture.  EXAM: CT ANGIOGRAPHY CHEST WITH CONTRAST  TECHNIQUE: Multidetector CT imaging of the chest was performed using the standard protocol during bolus administration of intravenous contrast. Multiplanar CT image reconstructions and MIPs were obtained to evaluate the vascular anatomy.  CONTRAST:  75 mL of Isovue 370 intravenously.  COMPARISON:  CT scan of July 24, 2015.  FINDINGS: Cardiovascular: There is no evidence of thoracic aortic dissection. Great vessels are widely patent without significant stenosis. Aneurysmal aortic root is noted measured at 5.6 cm. Proximal ascending thoracic aorta measures 4.1 cm in diameter. Transverse aortic arch measures 4.0 cm. Descending thoracic aorta has maximum measured diameter of 3.9 cm. Coronary artery calcifications are noted.  Mediastinum/Nodes: No enlarged mediastinal, hilar, or axillary lymph nodes. Thyroid gland, trachea, and esophagus demonstrate no significant findings.  Lungs/Pleura: Lungs are clear. No pleural effusion or pneumothorax.  Upper Abdomen: Large gallstone is noted. Stable partially calcified left renal cyst is noted.  Musculoskeletal: No chest wall abnormality. No acute or significant osseous findings.  Review of the MIP images confirms the above findings.  IMPRESSION: Stable aneurysmal dilatation of aortic root is noted at 5.6 cm. 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 Recommend annual imaging followup by CTA or MRA. This recommendation follows 2010  ACCF/AHA/AATS/ACR/ASA/SCA/SCAI/SIR/STS/SVM Guidelines for the Diagnosis and Management of Patients with Thoracic Aortic Disease. Circulation. 2010; 121: V784-O962.  Coronary artery calcifications are noted suggesting coronary disease.  Large gallstone is noted.  Electronically Signed: By: Marijo Conception, M.D. On: 03/26/2016 15:37          ASSESSMENT:    1. Thoracic aortic aneurysm without rupture ([place])   2. Essential hypertension   3. Nonrheumatic aortic valve insufficiency      PLAN:  In order of problems listed above:  1. Patient has thoracic aortic aneurysm measuring 5.2 cm. 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 He is stable with no clear cardiac symptoms. BP is well controlled. We discussed his diagnosis and the recommended therapy for aortic grafting and probable AVR. He was referred to Dr Enrique Sack by Dr Lonnie Hansen for dental evaluation. Will go ahead and arrange right and left heart cath/coronary angiogram on March 22. The procedure and risks were reviewed including but not limited to death, myocardial infarction, stroke, arrythmias, bleeding, transfusion, emergency surgery, dye allergy, or renal dysfunction. The patient voices understanding and is agreeable to proceed. 2. Severe osteoarthritis of the hip. Will need hip replacement surgery. We had a long discussion about this. He is stable from a cardiac standpoint to have hip surgery. I explained that he could have hip surgery now and his heart surgery in a few months after recovery or he could have heart surgery first and hip surgery later. He is much more concerned about his heart situation now especially since other family members have died with heart issues in the past. He would like to go ahead  and proceed with cardiac cath next and then decide.  3. HTN controlled.     Medication Adjustments/Labs and Tests Ordered: Current medicines are reviewed at length with the patient today.   Concerns regarding medicines are outlined above.  Medication changes, Labs and Tests ordered today are listed in the Patient Instructions below. Patient Instructions  We will schedule you for a cardiac cath procedure on March 22.     Signed, Lonnie Martinique, MD  04/01/2016 5:17 PM    [facility] [address] [place], [place], [id number] [phone]

## 2016-04-17 ENCOUNTER — Encounter (HOSPITAL_COMMUNITY): Payer: Self-pay | Admitting: Cardiology

## 2016-04-17 LAB — POCT I-STAT 3, ART BLOOD GAS (G3+)
BICARBONATE: 24.4 mmol/L (ref 20.0–28.0)
O2 Saturation: 93 %
PCO2 ART: 38.3 mmHg (ref 32.0–48.0)
PH ART: 7.412 (ref 7.350–7.450)
TCO2: 26 mmol/L (ref 0–100)
pO2, Arterial: 67 mmHg — ABNORMAL LOW (ref 83.0–108.0)

## 2016-04-17 LAB — POCT I-STAT 3, VENOUS BLOOD GAS (G3P V)
Bicarbonate: 25.6 mmol/L (ref 20.0–28.0)
O2 Saturation: 67 %
PCO2 VEN: 42.3 mmHg — AB (ref 44.0–60.0)
PO2 VEN: 35 mmHg (ref 32.0–45.0)
TCO2: 27 mmol/L (ref 0–100)
pH, Ven: 7.389 (ref 7.250–7.430)

## 2016-04-22 ENCOUNTER — Ambulatory Visit (INDEPENDENT_AMBULATORY_CARE_PROVIDER_SITE_OTHER): Payer: Medicare Other | Admitting: Cardiothoracic Surgery

## 2016-04-22 ENCOUNTER — Encounter: Payer: Self-pay | Admitting: Cardiothoracic Surgery

## 2016-04-22 VITALS — BP 145/79 | HR 64 | Resp 20 | Ht 69.0 in | Wt 197.0 lb

## 2016-04-22 DIAGNOSIS — I77810 Thoracic aortic ectasia: Secondary | ICD-10-CM

## 2016-04-22 NOTE — Progress Notes (Signed)
[address]       [place] [address]             [phone]                    Zimir C Uhlir [place] Medical Record #[id number] Date of Birth: Aug 05, 1950  Referring:PATERSON,DANIEL G, MD Primary Care: [name], MD  Chief Complaint:    Chief Complaint  Patient presents with  . Thoracic Aortic Dissection    Further discuss surgery, Cardiac Cath 04/16/2016    History of Present Illness:    Lonnie Hansen 66 y.o. male followed  for Dilated ascending aorta. He returns today after recent cardiac cath .   The patient complains of increasing right hip pain making it difficult for him to be active. He was seeing Dr. Ninfa Linden for consideration of right hip replacement. In preparation for this a chest x-ray was performed leading to a CT scan of the chest. The patient has a previous CT scan of the chest done during admission in 2012 for syncope.  Currently he denies any cardiac symptoms denies chest pain exertional resting shortness of breath though he does note his activity is limited due to his right hip pain other than episode of syncope in 2012 he's had no repeat syncopal episodes, notes rare palpitations. He has no previous history of myocardial infarction.   His family history is significant for his father who had had a MI prior but "drop dead at home" at age 39 in 70. His mother died of lymphoma date 75 1 brother and 1 sister alive his brothers had a myocardial infarction in the past., He has no children. There is no other history can on Cerone calls her cousins of unexplained sudden death at a young age.   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    Patient returns today with a repeat CT scan of the chest to compare to the previous one done in August 2017.  He notes his hip is continually gotten worse to the point he now uses a cane to ambulate and would  like to get it replaced.   Current Activity/ Functional Status:  Patient is independent with mobility/ambulation, transfers, ADL's, IADL's.  But does use a cane because of pain in the right hip  Zubrod Score: At the time of surgery this patient's most appropriate activity status/level should be described as: []     0    Normal activity, no symptoms [x]     1    Restricted in physical strenuous activity but ambulatory, able to do out light work []     2    Ambulatory and capable of self care, unable to do work activities, up and about               >50 % of waking hours                              []     3    Only limited self care, in bed greater than 50% of waking hours []     4    Completely disabled, no self care, confined to bed or chair []     5    Moribund   Past Medical History:  Diagnosis Date  . Anemia   .  Asymptomatic bilateral carotid artery stenosis 08/2015   1-39%   . Forgetfulness   . GI bleed   . HLD (hyperlipidemia)   . Hypertension   . Insomnia   . OA (osteoarthritis) of hip     Past Surgical History:  Procedure Laterality Date  . AORTIC ARCH ANGIOGRAPHY N/A 04/16/2016   Procedure: Aortic Arch Angiography;  Surgeon: [name] Martinique, MD;  Location: [facility];  Service: Cardiovascular;  Laterality: N/A;  . RIGHT/LEFT HEART CATH AND CORONARY ANGIOGRAPHY N/A 04/16/2016   Procedure: Right/Left Heart Cath and Coronary Angiography;  Surgeon: [name] Martinique, MD;  Location: [facility];  Service: Cardiovascular;  Laterality: N/A;    Family History  Problem Relation Age of Onset  . Heart disease Father   . Heart attack Father   . Thyroid disease Sister   As noted above his father died suddenly at age 53, he had a previous history of myocardial infarction   Social History   Social History  . Marital Status: Married    Spouse Name: N/A  . Number of Children: N/A  . Years of Education: N/A   Occupational History  . Not on file.   Social History Main  Topics  . Smoking status: Former Smoker -- 1.00 packs/day for 25 years    Types: Cigarettes  . Smokeless tobacco: Not on file  . Alcohol Use: No  . Drug Use: No  . Sexual Activity: Not on file   Other Topics Concern  . Not on file   Social History Narrative   Married.   He is a Optometrist.       History  Smoking Status  . Former Smoker  . Packs/day: 1.00  . Years: 25.00  . Types: Cigarettes  Smokeless Tobacco  . Never Used    History  Alcohol Use No     No Known Allergies  Current Outpatient Prescriptions  Medication Sig Dispense Refill  . amLODipine (NORVASC) 10 MG tablet Take 10 mg by mouth daily.    . Aspirin-Acetaminophen (GOODY BODY PAIN) 500-325 MG PACK Take by mouth 2 (two) times daily as needed (for hip pain).    Marland Kitchen atenolol (TENORMIN) 25 MG tablet Take 12.5 mg by mouth daily.     . bimatoprost (LUMIGAN) 0.01 % SOLN Place 1 drop into both eyes at bedtime.    . pantoprazole (PROTONIX) 40 MG tablet Take 40 mg by mouth daily as needed.      No current facility-administered medications for this visit.      Review of Systems:     Cardiac Review of Systems: Y or N  Chest Pain [  n  ]  Resting SOB [  n ] Exertional SOB  [n  ]  Orthopnea [ n ]   Pedal Edema [   ]    Palpitations [  ] Syncope  [ once 2012 ]   Presyncope [ n  ]  General Review of Systems: [Y] = yes [  ]=no Constitional: recent weight change [ gain ];  Wt loss over the last 3 months [   ] anorexia [  ]; fatigue [ y ]; nausea [ n ]; night sweats [ n ]; fever [n]; or chills [  n];          Dental: poor dentition[  n]; Last Dentist visit:   Eye : blurred vision [  ]; diplopia [   ]; vision changes [  ];  Amaurosis fugax[  ];  Resp: cough [n  ];  wheezing[ n ];  hemoptysis[ n ]; shortness of breath[  ]; paroxysmal nocturnal dyspnea[  ]; dyspnea on exertion[  ]; or orthopnea[  ];  GI:  gallstones[  ], vomiting[  ];  dysphagia[  ]; melena[  ];  hematochezia [  ]; heartburn[  ];   Hx of  Colonoscopy[ y  last year ]; GU: kidney stones [  ]; hematuria[  ];   dysuria [  ];  nocturia[  ];  history of     obstruction [  ]; urinary frequency [  ]             Skin: rash, swelling[  ];, hair loss[  ];  peripheral edema[  ];  or itching[  ]; Musculosketetal: myalgias[  ];  joint swelling[  ];  joint erythema[  ];  joint pain[  ];  back pain[  ];  Heme/Lymph: bruising[  ];  bleeding[  ];  anemia[  ];  Neuro: TIA[  ];  headaches[  ];  stroke[  ];  vertigo[  ];  seizures[ n ];   paresthesias[  ];  difficulty walking[ y ];  Psych:depression[  ]; anxiety[  ];  Endocrine: diabetes[ n ];  thyroid dysfunction[n ];  Immunizations: Flu up to date [ y]; Pneumococcal up to date [ n ];  Other:  Physical Exam: BP (!) 145/79   Pulse 64   Resp 20   Ht 5\' 9"  (1.753 m)   Wt 197 lb (89.4 kg)   SpO2 97% Comment: RA  BMI 29.09 kg/m   PHYSICAL EXAMINATION: General appearance: alert, cooperative and appears older than stated age Head: Normocephalic, without obvious abnormality, atraumatic Neck: no adenopathy, no carotid bruit, no JVD, supple, symmetrical, trachea midline and thyroid not enlarged, symmetric, no tenderness/mass/nodules Lymph nodes: Cervical, supraclavicular, and axillary nodes normal. Resp: clear to auscultation bilaterally Back: symmetric, no curvature. ROM normal. No CVA tenderness. Cardio: regular rate and rhythm, S1, S2 normal,murmur of AI +2 left sternal border, click, rub or gallop GI: soft, non-tender; bowel sounds normal; no masses,  no organomegaly Extremities: extremities normal, atraumatic, no cyanosis or edema and Homans sign is negative, no sign of DVT Neurologic: Grossly normal Palpable dp and pt pulses bilaterial  Diagnostic Studies & Laboratory data:     Recent Radiology Findings:    Ct Angio Chest Aorta W/cm &/or Wo/cm  Addendum Date: 03/29/2016   ADDENDUM REPORT: 03/29/2016 14:08 ADDENDUM: The aortic root at the level of the sinuses of Valsalva was remeasured at  approximately 4.9- 5.2 cm in greatest diameter depending on axis of measurement. This appears stable compared to the prior coronary CTA on 08/28/2015. Electronically Signed   By: Aletta Edouard M.D.   On: 03/29/2016 14:08    Ct Angio Chest Aorta W/cm &/or Wo/cm  Result Date: 03/26/2016 CLINICAL DATA:  Thoracic aortic aneurysm without rupture. EXAM: CT ANGIOGRAPHY CHEST WITH CONTRAST TECHNIQUE: Multidetector CT imaging of the chest was performed using the standard protocol during bolus administration of intravenous contrast. Multiplanar CT image reconstructions and MIPs were obtained to evaluate the vascular anatomy. CONTRAST:  75 mL of Isovue 370 intravenously. COMPARISON:  CT scan of July 24, 2015. FINDINGS: Cardiovascular: There is no evidence of thoracic aortic dissection. Great vessels are widely patent without significant stenosis. Aneurysmal aortic root is noted measured at 5.6 cm. Proximal ascending thoracic aorta measures 4.1 cm in diameter. Transverse aortic arch measures 4.0 cm. Descending thoracic aorta has maximum measured diameter of  3.9 cm. Coronary artery calcifications are noted. Mediastinum/Nodes: No enlarged mediastinal, hilar, or axillary lymph nodes. Thyroid gland, trachea, and esophagus demonstrate no significant findings. Lungs/Pleura: Lungs are clear. No pleural effusion or pneumothorax. Upper Abdomen: Large gallstone is noted. Stable partially calcified left renal cyst is noted. Musculoskeletal: No chest wall abnormality. No acute or significant osseous findings. Review of the MIP images confirms the above findings. IMPRESSION: Stable aneurysmal dilatation of aortic root is noted at 5.6 cm. 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 Recommend annual imaging followup by CTA or MRA. This recommendation follows 2010 ACCF/AHA/AATS/ACR/ASA/SCA/SCAI/SIR/STS/SVM Guidelines for the Diagnosis and Management of Patients with Thoracic Aortic Disease. Circulation. 2010; 121:  A453-M468. Coronary artery calcifications are noted suggesting coronary disease. Large gallstone is noted. Electronically Signed   By: Marijo Conception, M.D.   On: 03/26/2016 15:37   I have independently reviewed the above radiology studies  and reviewed the findings with the patient. I've also discussed the CT findings with Dr. Nyoka Cowden , as the measurements I get both on axial coronal and sagittal cuts measures the aortic root at approximate 5.2    Ct Coronary Morph W/cta Cor W/score W/ca W/cm &/or Wo/cm  Addendum Date: 08/28/2015   ADDENDUM REPORT: 08/28/2015 13:09 CLINICAL DATA:  Aortic Aneurysm EXAM: Cardiac  CT TECHNIQUE: The patient was scanned on a Philips 256 scanner. A 120 kV retrospective scan was triggered in the descending thoracic aorta at 111 HU's. Gantry rotation speed was 270 msecs and collimation was .9 mm. No beta blockade or nitro were given. The 3D data set was reconstructed in 5% intervals of the R-R cycle. Systolic and diastolic phases were analyzed on a dedicated work station using MPR, MIP and VRT modes. The patient received 80 cc of contrast. FINDINGS: Aortic Valve:  Trileaflet without significant calcium Aorta: Fusiform dilatation of the ascending aorta widest at the sinus level. Bovine Arch with no coarctation. Isolated area of calcification at the isthmus with mild aneurysmal dilatation in this area as well Orthogonal double oblique measurements as follows Sinotubular Junction:  40 mm Ascending Thoracic Aorta:  42 mm Aortic Arch:  40 mm Isthmus:  40 mm Descending Thoracic Aorta:  27 mm Sinus of Valsalva Measurements: Non-coronary:  45 mm Right coronary: 46 mm Left coronary: 48 mm : 1) Trileaflet aortic valve with no significant calcification 2) Fusiform dilatation of the aortic sinuses maximum measurement in coronal view 51 mm. However anatomic short axis Views show largest diameter of 48 mm for left coronary sinus 3) Tortuous left subclavian vein 4) Bovine Arch 5) Mild dilatation of  the aortic root, arch and isthmus 6) Normal descending thoracic aorta with no coarctation Jenkins Rouge Electronically Signed   By: Jenkins Rouge M.D.   On: 08/28/2015 13:09   Result Date: 08/28/2015 EXAM: OVER-READ INTERPRETATION  CT CHEST The following report is an over-read performed by radiologist Dr. Rebekah Chesterfield [facility] on 08/28/2015. This over-read does not include interpretation of cardiac or coronary anatomy or pathology. The coronary calcium score/coronary CTA interpretation by the cardiologist is attached. COMPARISON:  Chest CT 07/24/2015. FINDINGS: Aortic atherosclerosis. Mild aneurysmal dilatation of the ascending thoracic aorta and the arch, both of which measure 4.5 cm in diameter. The isthmus is also aneurysmal measuring 4.3 cm in diameter. The remainder the descending thoracic aorta is otherwise normal in caliber. Aortic root dilatation (to be described separately). No evidence of thoracic aortic dissection. Within the visualized portions of the thorax there are no suspicious appearing  pulmonary nodules or masses, there is no acute consolidative airspace disease, no pleural effusions, no pneumothorax and no lymphadenopathy. Visualized portions of the upper abdomen are unremarkable. There are no aggressive appearing lytic or blastic lesions noted in the visualized portions of the skeleton. IMPRESSION: 1. No acute incidental noncardiac findings are noted. 2. Aneurysmal dilatation of the ascending thoracic aorta, aortic arch and isthmus, as above. No thoracic aortic dissection at this time. Recommend semi-annual imaging followup by CTA or MRA. This recommendation follows 2010 ACCF/AHA/AATS/ACR/ASA/SCA/SCAI/SIR/STS/SVM Guidelines for the Diagnosis and Management of Patients With Thoracic Aortic Disease. Circulation. 2010; 121: C947-S962. Electronically Signed: By: Vinnie Langton M.D. On: 08/28/2015 10:59    Ct Chest W Contrast  07/24/2015  CLINICAL DATA:  Ectatic thoracic aorta  on recent chest radiograph EXAM: CT CHEST WITH CONTRAST TECHNIQUE: Multidetector CT imaging of the chest was performed during intravenous contrast administration. CONTRAST:  71mL ISOVUE-300 IOPAMIDOL (ISOVUE-300) INJECTION 61% COMPARISON:  Most recent available chest CT April 27, 2010 FINDINGS: Mediastinum/Lymph Nodes: There is dilatation of the ascending thoracic aorta. Just beyond the aortic valve, the ascending thoracic aorta measures 6.1 x 4.9 cm, stable from prior study. At the level of the proximal right main pulmonary artery, the thoracic aorta has a maximum transverse diameter of 4.6 x 4.1 cm, essentially stable from prior study. At the level of the aortic arch, the transverse diameter is 4.6 cm, stable. There is no thoracic aortic dissection. Visualized great vessels appear somewhat tortuous but otherwise unremarkable. There is no major vessel pulmonary embolus. There are multiple foci of coronary artery calcification. There is no appreciable pericardial thickening. Thyroid appears unremarkable. There is no appreciable thoracic adenopathy. Lungs/Pleura: There is slight scarring in the lung bases. There is no parenchymal lung edema or consolidation. Upper abdomen: In the visualized upper abdomen, there is cholelithiasis. Gallbladder wall does not appear appreciably thickened. There is a complex cyst in the upper left kidney containing calcification, unchanged compared to prior study. There is a parapelvic cyst on the right, incompletely visualized, measuring 4.2 x 3.5 cm. Musculoskeletal: There are no blastic or lytic bone lesions. IMPRESSION: Thoracic aortic prominence as noted above. Prominence is greatest just beyond the aortic valve. Question a degree of aortic valve disease. Recommend semi-annual imaging followup by CTA or MRA and referral to cardiothoracic surgery if not already obtained. This recommendation follows 2010 ACCF/AHA/AATS/ACR/ASA/SCA/SCAI/SIR/STS/SVM Guidelines for the Diagnosis and  Management of Patients With Thoracic Aortic Disease. Circulation. 2010; 121: [id number]. Note that this aortic prominence is essentially stable compared to 2012 study. There is no periaortic fluid. No thoracic aortic dissection noted. No pericardial effusion. There are multiple areas of coronary artery calcification. No parenchymal lung edema or consolidation. No parenchymal lung mass or adenopathy. Cholelithiasis. Stable cystic lesion containing multiple calcifications in the upper pole left kidney. Parapelvic cyst right kidney. Electronically Signed   By: Lowella Grip III M.D.   On: 07/24/2015 11:00    I have independently reviewed the above radiology studies  and reviewed the findings with the patient.  ECHO: Echocardiography  Patient:    Keshon, Markovitz MR #:       [id number] Study Date: 09/20/2015 Gender:     M Age:        62 Height:     175.3 cm Weight:     91.6 kg BSA:        2.14 m^2 Pt. Status: Room:   ORDERING     Lanelle Bal MD  REFERRING    Lanelle Bal  MD  REFERRING    [name] [id number]  ATTENDING    Candee Furbish, M.D.  SONOGRAPHER  Cindy Hazy, RDCS  PERFORMING   [facility], Outpatient  cc:  ------------------------------------------------------------------- LV EF: 60% -   65%  ------------------------------------------------------------------- Indications:      I35.1 Aortic Insufficiency.  I71.2 Ascending Aortic Aneurysm.  ------------------------------------------------------------------- History:   PMH:  Acquired from the patient and from the patient&'s chart.  PMH:  Dilated Thoracic Aorta.  Risk factors:  Hypertension. Dyslipidemia.  ------------------------------------------------------------------- Study Conclusions  - Left ventricle: The cavity size was normal. There was moderate   focal basal hypertrophy of the septum. Systolic function was   normal. The estimated ejection fraction was in the range of 60%   to 65%. Wall  motion was normal; there were no regional wall   motion abnormalities. Doppler parameters are consistent with   abnormal left ventricular relaxation (grade 1 diastolic   dysfunction). - Aortic valve: There was moderate to severe regurgitation. - Aorta: Aortic root dimension: 43 mm (ED). - Ascending aorta: The ascending aorta was mildly dilated. - Right ventricle: The cavity size was mildly dilated. Wall   thickness was normal.  ------------------------------------------------------------------- Study data:   Study status:  Routine.  Procedure:  The patient reported no pain pre or post test. Transthoracic echocardiography for left ventricular function evaluation, for right ventricular function evaluation, and for assessment of valvular function. Image quality was adequate.  Study completion:  There were no complications.          Echocardiography.  M-mode, complete 2D, spectral Doppler, and color Doppler.  Birthdate:  Patient birthdate: Oct 25, 1950.  Age:  Patient is 66 yr old.  Sex:  Gender: male.    BMI: 29.8 kg/m^2.  Blood pressure:     136/74  Patient status:  Outpatient.  Study date:  Study date: 09/20/2015. Study time: 10:58 AM.  Location:  [facility]  -------------------------------------------------------------------  ------------------------------------------------------------------- Left ventricle:  The cavity size was normal. There was moderate focal basal hypertrophy of the septum. Systolic function was normal. The estimated ejection fraction was in the range of 60% to 65%. Wall motion was normal; there were no regional wall motion abnormalities. Doppler parameters are consistent with abnormal left ventricular relaxation (grade 1 diastolic dysfunction).  ------------------------------------------------------------------- Aortic valve:   Trileaflet; normal thickness leaflets. Mobility was not restricted.  Doppler:  Transvalvular velocity was within the normal  range. There was no stenosis. There was moderate to severe regurgitation.  ------------------------------------------------------------------- Aorta:  Ascending aorta: The ascending aorta was mildly dilated.  ------------------------------------------------------------------- Mitral valve:   Structurally normal valve.   Mobility was not restricted.  Doppler:  Transvalvular velocity was within the normal range. There was no evidence for stenosis. There was no regurgitation.  ------------------------------------------------------------------- Left atrium:  The atrium was normal in size.  ------------------------------------------------------------------- Right ventricle:  The cavity size was mildly dilated. Wall thickness was normal. Systolic function was normal.  ------------------------------------------------------------------- Pulmonic valve:    Doppler:  Transvalvular velocity was within the normal range. There was no evidence for stenosis.  ------------------------------------------------------------------- Tricuspid valve:   Structurally normal valve.    Doppler: Transvalvular velocity was within the normal range. There was trivial regurgitation.  ------------------------------------------------------------------- Pulmonary artery:   The main pulmonary artery was normal-sized. Systolic pressure was within the normal range.  ------------------------------------------------------------------- Right atrium:  The atrium was normal in size.  ------------------------------------------------------------------- Pericardium:  There was no pericardial effusion.  ------------------------------------------------------------------- Systemic veins: Inferior vena cava: The vessel was normal in size.  -------------------------------------------------------------------  Measurements   Left ventricle                           Value        Reference  LV ID, ED, PLAX chordal                   50.9  mm     43 - 52  LV ID, ES, PLAX chordal                  34.6  mm     23 - 38  LV fx shortening, PLAX chordal           32    %      >=29  LV PW thickness, ED                      10.9  mm     ---------  IVS/LV PW ratio, ED              (H)     1.39         <=1.3  Stroke volume, 2D                        124   ml     ---------  Stroke volume/bsa, 2D                    58    ml/m^2 ---------  LV e&', lateral                           5.26  cm/s   ---------  LV E/e&', lateral                         7.7          ---------  LV e&', medial                            5.55  cm/s   ---------  LV E/e&', medial                          7.3          ---------  LV e&', average                           5.41  cm/s   ---------  LV E/e&', average                         7.49         ---------    Ventricular septum                       Value        Reference  IVS thickness, ED                        15.2  mm     ---------    LVOT  Value        Reference  LVOT ID, S                               30    mm     ---------  LVOT area                                7.07  cm^2   ---------  LVOT ID                                  30    mm     ---------  LVOT peak velocity, S                    76.7  cm/s   ---------  LVOT mean velocity, S                    53.7  cm/s   ---------  LVOT VTI, S                              17.5  cm     ---------  LVOT peak gradient, S                    2     mm Hg  ---------  Stroke volume (SV), LVOT DP              123.7 ml     ---------  Stroke index (SV/bsa), LVOT DP           57.9  ml/m^2 ---------    Aortic valve                             Value        Reference  Aortic regurg pressure half-time         916   ms     ---------    Aorta                                    Value        Reference  Aortic root ID, ED                       43    mm     ---------    Left atrium                              Value         Reference  LA ID, A-P, ES                           33    mm     ---------  LA ID/bsa, A-P                           1.54  cm/m^2 <=2.2  LA volume, S  37    ml     ---------  LA volume/bsa, S                         17.3  ml/m^2 ---------  LA volume, ES, 1-p A4C                   31    ml     ---------  LA volume/bsa, ES, 1-p A4C               14.5  ml/m^2 ---------  LA volume, ES, 1-p A2C                   44    ml     ---------  LA volume/bsa, ES, 1-p A2C               20.6  ml/m^2 ---------    Mitral valve                             Value        Reference  Mitral E-wave peak velocity              40.5  cm/s   ---------  Mitral A-wave peak velocity              57.8  cm/s   ---------  Mitral deceleration time         (H)     257   ms     150 - 230  Mitral E/A ratio, peak                   0.7          ---------    Tricuspid valve                          Value        Reference  Tricuspid regurg peak velocity           251   cm/s   ---------  Tricuspid peak RV-RA gradient            25    mm Hg  ---------    Right ventricle                          Value        Reference  RV s&', lateral, S                        18    cm/s   ---------  Legend: (L)  and  (H)  mark values outside specified reference range.  ------------------------------------------------------------------- Prepared and Electronically Authenticated by  Candee Furbish, M.D. 2017-08-25T13:27:13  There is  moderate to severe regurgitation On echo  EF 60- 65%   LV ID, ED, PLAX chordal                  50.9  mm     43 - 52  LV ID, ES, PLAX chordal                  34.6  mm     23 - 38    Cath: Procedures   Aortic Arch Angiography  Right/Left Heart Cath and Coronary Angiography  Conclusion  Mid RCA lesion, 80 %stenosed.  Ost LAD to Prox LAD lesion, 25 %stenosed.  Dist LAD lesion, 50 %stenosed.  There is mild left ventricular systolic dysfunction.  LV end diastolic  pressure is normal.  The left ventricular ejection fraction is 45-50% by visual estimate.  There is no mitral valve regurgitation.  There is no aortic valve stenosis.  LV end diastolic pressure is normal.   1. 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 Mild LV dysfunction. EF 45%. 3. Ascending thoracic aortic aneurysm. 4. 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 5. Normal Right heart  And LV filling pressures 6. Normal cardiac output.   Plan: Aortic root grafting and AVR.      I have independently reviewed the above  cath films and reviewed the findings with the  patient .   Recent Lab Findings:    Aortic Size Index=      5.2  /Body surface area is 2.09 meters squared. 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  Patient was referred for dental clearance, he has poor teeth but has not gone to dentist yet, will referr to dental clinic and see back in one month or sooner depending when dental extraction done , and then plan , root placement for 3+ ai and dilated aortic root.    Grace Isaac MD      [address] [place] [address] Office [phone]   Beeper [phone]  04/22/2016 3:22 PM

## 2016-04-29 ENCOUNTER — Encounter: Payer: Self-pay | Admitting: Cardiology

## 2016-04-29 ENCOUNTER — Ambulatory Visit (INDEPENDENT_AMBULATORY_CARE_PROVIDER_SITE_OTHER): Payer: Medicare Other | Admitting: Cardiology

## 2016-04-29 VITALS — BP 147/82 | HR 70 | Ht 69.0 in | Wt 197.6 lb

## 2016-04-29 DIAGNOSIS — Z8679 Personal history of other diseases of the circulatory system: Secondary | ICD-10-CM | POA: Insufficient documentation

## 2016-04-29 DIAGNOSIS — I2510 Atherosclerotic heart disease of native coronary artery without angina pectoris: Secondary | ICD-10-CM

## 2016-04-29 DIAGNOSIS — I712 Thoracic aortic aneurysm, without rupture, unspecified: Secondary | ICD-10-CM

## 2016-04-29 DIAGNOSIS — I10 Essential (primary) hypertension: Secondary | ICD-10-CM | POA: Diagnosis not present

## 2016-04-29 NOTE — Progress Notes (Signed)
Cardiology Office Note    Date:  04/29/2016   ID:  Lonnie Hansen, DOB 03-10-1950, MRN [id number]  PCP:  [name], MD  Cardiologist:  Baleigh Rennaker Martinique, MD    History of Present Illness:  Lonnie Hansen is a 66 y.o. male seen for follow up s/p cardiac cath.  He has a history of HLD and HTN. In 2012 he was admitted with a syncopal spell. He ruled out for MI. He was hypokalemic. He had a follow up stress test as an outpatient with Dr. Debara Pickett that was apparently OK. More recently he was planning to have hip replacement. CXR showed aortic enlargement. He then underwent CT that showed dilation of the proximal aorta. He has been seen by Dr. Servando Snare. CT of the aorta showed mild aortic root enlargement with maximal diameter 4.5 cm. The descending aorta was normal. He denies any chest pain or SOB. He had an Echocardiogram that showed normal LV function and moderate to severe AI. Carotid dopplers showed mild nonobstructive disease.   More recently evaluation with Dr. Servando Snare CT showed aortic root enlargement to 5.2 cm. Given size of root and degree of AI it is recommended that he undergo Aortic root grafting and AVR in next 8-12 months. He underwent pre op evaluation with cardiac cath on 04/16/16. This showed moderate to severe AI 3+. LV dysfunction with EF 45%. 80% stenosis in RCA and 50% stenosis in distal LAD. He has seen Dr. Servando Snare back with plans to proceed with surgery once he has completed dental evaluation.   He denies any dizziness, palpitations, or syncope.He denies any chest pain or dyspnea.    Past Medical History:  Diagnosis Date  . Anemia   . Asymptomatic bilateral carotid artery stenosis 08/2015   1-39%   . Forgetfulness   . GI bleed   . HLD (hyperlipidemia)   . Hypertension   . Insomnia   . OA (osteoarthritis) of hip     Past Surgical History:  Procedure Laterality Date  . AORTIC ARCH ANGIOGRAPHY N/A 04/16/2016   Procedure: Aortic Arch Angiography;  Surgeon: Philana Younis M  Martinique, MD;  Location: [facility];  Service: Cardiovascular;  Laterality: N/A;  . RIGHT/LEFT HEART CATH AND CORONARY ANGIOGRAPHY N/A 04/16/2016   Procedure: Right/Left Heart Cath and Coronary Angiography;  Surgeon: Marquette Blodgett M Martinique, MD;  Location: [facility];  Service: Cardiovascular;  Laterality: N/A;    Current Medications: Outpatient Medications Prior to Visit  Medication Sig Dispense Refill  . amLODipine (NORVASC) 10 MG tablet Take 10 mg by mouth daily.    . Aspirin-Acetaminophen (GOODY BODY PAIN) 500-325 MG PACK Take by mouth 2 (two) times daily as needed (for hip pain).    Marland Kitchen atenolol (TENORMIN) 25 MG tablet Take 12.5 mg by mouth daily.     . bimatoprost (LUMIGAN) 0.01 % SOLN Place 1 drop into both eyes at bedtime.    . pantoprazole (PROTONIX) 40 MG tablet Take 40 mg by mouth daily as needed.      No facility-administered medications prior to visit.      Allergies:   Patient has no known allergies.   Social History   Social History  . Marital status: Married    Spouse name: N/A  . Number of children: N/A  . Years of education: N/A   Social History Main Topics  . Smoking status: Former Smoker    Packs/day: 1.00    Years: 25.00    Types: Cigarettes  . Smokeless tobacco:  Never Used  . Alcohol use No  . Drug use: No  . Sexual activity: Not Asked   Other Topics Concern  . None   Social History Narrative   Married.   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     Family History:  The patient's family history includes Heart attack in his father; Heart disease in his father; Thyroid disease in his sister.   ROS:   Please see the history of present illness.    ROS All other systems reviewed and are negative.   PHYSICAL EXAM:   VS:  BP (!) 147/82 (BP Location: Left Arm)   Pulse 70   Ht 5\' 9"  (1.753 m)   Wt 197 lb 9.6 oz (89.6 kg)   BMI 29.18 kg/m    GEN: Well nourished, well developed, in no acute distress  HEENT: normal, voice is hoarse.  Neck: no  JVD, carotid bruits, or masses Cardiac: RRR; soft 1/6  Diastolic murmur LSB.  Respiratory:  clear to auscultation bilaterally, normal work of breathing GI: soft, nontender, nondistended, + BS MS: no deformity or atrophy, No radial site hematoma. Skin: warm and dry, no rash Neuro:  Alert and Oriented x 3, Strength and sensation are intact Psych: euthymic mood, full affect  Wt Readings from Last 3 Encounters:  04/29/16 197 lb 9.6 oz (89.6 kg)  04/22/16 197 lb (89.4 kg)  04/16/16 197 lb (89.4 kg)      Studies/Labs Reviewed:   EKG:  EKG is not ordered today.  The ekg ordered today demonstrates N/A   Recent Labs: 04/10/2016: BUN 11; Creat 0.75; Hemoglobin 13.2; Platelets 104; Potassium 3.8; Sodium 139   Lipid Panel    Component Value Date/Time   CHOL  04/28/2010 0703    187        ATP III CLASSIFICATION:  <200     mg/dL   Desirable  200-239  mg/dL   Borderline High  >=240    mg/dL   High          TRIG 69 04/28/2010 0703   HDL 54 04/28/2010 0703   CHOLHDL 3.5 04/28/2010 0703   VLDL 14 04/28/2010 0703   LDLCALC (H) 04/28/2010 0703    119        Total Cholesterol/HDL:CHD Risk Coronary Heart Disease Risk Table                     Men   Women  1/2 Average Risk   3.4   3.3  Average Risk       5.0   4.4  2 X Average Risk   9.6   7.1  3 X Average Risk  23.4   11.0        Use the calculated Patient Ratio above and the CHD Risk Table to determine the patient's CHD Risk.        ATP III CLASSIFICATION (LDL):  <100     mg/dL   Optimal  100-129  mg/dL   Near or Above                    Optimal  130-159  mg/dL   Borderline  160-189  mg/dL   High  >190     mg/dL   Very High   Echo 09/20/15: Study Conclusions  - Left ventricle: The cavity size was normal. There was moderate   focal basal hypertrophy of the septum. Systolic function was   normal.  The estimated ejection fraction was in the range of 60%   to 65%. Wall motion was normal; there were no regional wall   motion  abnormalities. Doppler parameters are consistent with   abnormal left ventricular relaxation (grade 1 diastolic   dysfunction). - Aortic valve: There was moderate to severe regurgitation. - Aorta: Aortic root dimension: 43 mm (ED). - Ascending aorta: The ascending aorta was mildly dilated. - Right ventricle: The cavity size was mildly dilated. Wall   thickness was normal.   ADDENDUM REPORT: 03/29/2016 14:08  ADDENDUM: The aortic root at the level of the sinuses of Valsalva was remeasured at approximately 4.9- 5.2 cm in greatest diameter depending on axis of measurement. This appears stable compared to the prior coronary CTA on 08/28/2015.   Electronically Signed   By: Aletta Edouard M.D.   On: 03/29/2016 14:08   Addended by Aletta Edouard, MD on 03/29/2016 2:11 PM    Study Result   CLINICAL DATA:  Thoracic aortic aneurysm without rupture.  EXAM: CT ANGIOGRAPHY CHEST WITH CONTRAST  TECHNIQUE: Multidetector CT imaging of the chest was performed using the standard protocol during bolus administration of intravenous contrast. Multiplanar CT image reconstructions and MIPs were obtained to evaluate the vascular anatomy.  CONTRAST:  75 mL of Isovue 370 intravenously.  COMPARISON:  CT scan of July 24, 2015.  FINDINGS: Cardiovascular: There is no evidence of thoracic aortic dissection. Great vessels are widely patent without significant stenosis. Aneurysmal aortic root is noted measured at 5.6 cm. Proximal ascending thoracic aorta measures 4.1 cm in diameter. Transverse aortic arch measures 4.0 cm. Descending thoracic aorta has maximum measured diameter of 3.9 cm. Coronary artery calcifications are noted.  Mediastinum/Nodes: No enlarged mediastinal, hilar, or axillary lymph nodes. Thyroid gland, trachea, and esophagus demonstrate no significant findings.  Lungs/Pleura: Lungs are clear. No pleural effusion or pneumothorax.  Upper Abdomen: Large gallstone is  noted. Stable partially calcified left renal cyst is noted.  Musculoskeletal: No chest wall abnormality. No acute or significant osseous findings.  Review of the MIP images confirms the above findings.  IMPRESSION: Stable aneurysmal dilatation of aortic root is noted at 5.6 cm. 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 Recommend annual imaging followup by CTA or MRA. This recommendation follows 2010 ACCF/AHA/AATS/ACR/ASA/SCA/SCAI/SIR/STS/SVM Guidelines for the Diagnosis and Management of Patients with Thoracic Aortic Disease. Circulation. 2010; 121: R154-M086.  Coronary artery calcifications are noted suggesting coronary disease.  Large gallstone is noted.  Electronically Signed: By: Marijo Conception, M.D. On: 03/26/2016 15:37       Conclusion     Mid RCA lesion, 80 %stenosed.  Ost LAD to Prox LAD lesion, 25 %stenosed.  Dist LAD lesion, 50 %stenosed.  There is mild left ventricular systolic dysfunction.  LV end diastolic pressure is normal.  The left ventricular ejection fraction is 45-50% by visual estimate.  There is no mitral valve regurgitation.  There is no aortic valve stenosis.  LV end diastolic pressure is normal.   1. 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 Mild LV dysfunction. EF 45%. 3. Ascending thoracic aortic aneurysm. 4. 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 5. Normal Right heart  And LV filling pressures 6. Normal cardiac output.   Plan: Aortic root grafting and AVR.        ASSESSMENT:    1. Thoracic aortic aneurysm without rupture ([place])   2. Essential hypertension   3. Coronary artery disease involving native coronary artery of native heart without angina pectoris  PLAN:  In order of problems listed above:  1. Patient has thoracic aortic aneurysm measuring 5.2 cm. 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 He has single vessel obstructive CAD.  BP is well  controlled. We discussed his diagnosis and the recommended therapy for aortic grafting, AVR and CABG. He was referred to Dr Enrique Sack by Dr Servando Snare for dental evaluation. 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 I will plan on follow up after. 2. Severe osteoarthritis of the hip.  3. HTN controlled.     Medication Adjustments/Labs and Tests Ordered: Current medicines are reviewed at length with the patient today.  Concerns regarding medicines are outlined above.  Medication changes, Labs and Tests ordered today are listed in the Patient Instructions below. Patient Instructions  Continue your current therapy  I will see you in 6 months.      Signed, Waller Marcussen Martinique, MD  04/29/2016 12:58 PM    [facility] [address] [place], [place], [address] [phone]

## 2016-04-29 NOTE — Patient Instructions (Signed)
Continue your current therapy  I will see you in 6 months.   

## 2016-04-30 ENCOUNTER — Ambulatory Visit: Payer: Medicare Other | Admitting: Cardiology

## 2016-05-04 DIAGNOSIS — H1013 Acute atopic conjunctivitis, bilateral: Secondary | ICD-10-CM | POA: Diagnosis not present

## 2016-05-04 DIAGNOSIS — H524 Presbyopia: Secondary | ICD-10-CM | POA: Diagnosis not present

## 2016-05-04 DIAGNOSIS — H04123 Dry eye syndrome of bilateral lacrimal glands: Secondary | ICD-10-CM | POA: Diagnosis not present

## 2016-05-04 DIAGNOSIS — H401231 Low-tension glaucoma, bilateral, mild stage: Secondary | ICD-10-CM | POA: Diagnosis not present

## 2016-05-28 ENCOUNTER — Ambulatory Visit: Payer: Medicare Other | Admitting: Cardiothoracic Surgery

## 2016-06-02 ENCOUNTER — Encounter (HOSPITAL_COMMUNITY): Payer: Self-pay | Admitting: Dentistry

## 2016-06-02 ENCOUNTER — Ambulatory Visit (HOSPITAL_COMMUNITY): Payer: Self-pay | Admitting: Dentistry

## 2016-06-02 VITALS — BP 139/67 | HR 55 | Temp 98.3°F

## 2016-06-02 DIAGNOSIS — K036 Deposits [accretions] on teeth: Secondary | ICD-10-CM

## 2016-06-02 DIAGNOSIS — M270 Developmental disorders of jaws: Secondary | ICD-10-CM

## 2016-06-02 DIAGNOSIS — K083 Retained dental root: Secondary | ICD-10-CM

## 2016-06-02 DIAGNOSIS — I351 Nonrheumatic aortic (valve) insufficiency: Secondary | ICD-10-CM | POA: Diagnosis not present

## 2016-06-02 DIAGNOSIS — K030 Excessive attrition of teeth: Secondary | ICD-10-CM

## 2016-06-02 DIAGNOSIS — K06020 Generalized gingival recession, unspecified: Secondary | ICD-10-CM

## 2016-06-02 DIAGNOSIS — I712 Thoracic aortic aneurysm, without rupture, unspecified: Secondary | ICD-10-CM

## 2016-06-02 DIAGNOSIS — K0530 Chronic periodontitis, unspecified: Secondary | ICD-10-CM

## 2016-06-02 DIAGNOSIS — M898X0 Other specified disorders of bone, multiple sites: Secondary | ICD-10-CM

## 2016-06-02 DIAGNOSIS — Z972 Presence of dental prosthetic device (complete) (partial): Secondary | ICD-10-CM

## 2016-06-02 DIAGNOSIS — K045 Chronic apical periodontitis: Secondary | ICD-10-CM

## 2016-06-02 DIAGNOSIS — K029 Dental caries, unspecified: Secondary | ICD-10-CM

## 2016-06-02 DIAGNOSIS — K0889 Other specified disorders of teeth and supporting structures: Secondary | ICD-10-CM

## 2016-06-02 DIAGNOSIS — Z01818 Encounter for other preprocedural examination: Secondary | ICD-10-CM

## 2016-06-02 DIAGNOSIS — K08409 Partial loss of teeth, unspecified cause, unspecified class: Secondary | ICD-10-CM

## 2016-06-02 DIAGNOSIS — Z01810 Encounter for preprocedural cardiovascular examination: Secondary | ICD-10-CM | POA: Diagnosis not present

## 2016-06-02 DIAGNOSIS — M264 Malocclusion, unspecified: Secondary | ICD-10-CM

## 2016-06-02 NOTE — Patient Instructions (Signed)
[facility]    Department of Dental Medicine     DR. Orland Visconti      HEART VALVES AND MOUTH CARE:  FACTS:   If you have any infection in your mouth, it can infect your heart valve.  If you heart valve is infected, you will be seriously ill.  Infections in the mouth can be SILENT and do not always cause pain.  Examples of infections in the mouth are gum disease, dental cavities, and abscesses.  Some possible signs of infection are: Bad breath, bleeding gums, or teeth that are sensitive to sweets, hot, and/or cold. There are many other signs as well.  WHAT YOU HAVE TO DO:   Brush your teeth after meals and at bedtime. Spend at least 2 minutes brushing well, especially behind your back teeth and all around your teeth that stand alone. Brush at the gumline also.  Do not go to bed without brushing your teeth and flossing.  If you gums bleed when you brush or floss, do NOT stop brushing or flossing. It usually means that your gums need more attention and better cleaning.   If your Dentist or Dr. Shaylah Mcghie gave you a prescription mouthwash to use, make sure to use it as directed. If you run out of the medication, get a refill at the pharmacy.   If you were given any other medications or directions by your Dentist, please follow them. If you did not understand the directions or forget what you were told, please call. We will be happy to refresh her memory.  If you need antibiotics before dental procedures, make sure you take them one hour prior to every dental visit as directed.   Get a dental checkup every 4-6 months in order to keep your mouth healthy, or to find and treat any new infection. You will most likely need your teeth cleaned or gums treated at the same time.  If you are not able to come in for your scheduled appointment, call your Dentist as soon as possible to reschedule.  If you have a problem in between dental visits, call your Dentist.  

## 2016-06-02 NOTE — Progress Notes (Signed)
DENTAL CONSULTATION  Date of Consultation:  06/02/2016 Patient Name:   Lonnie Hansen Date of Birth:   04-20-1950 Medical Record Number: [id number]  VITALS: BP 139/67 (BP Location: Left Arm)   Pulse (!) 55   Temp 98.3 F (36.8 C) (Oral)   CHIEF COMPLAINT: Patient referred by Dr. Servando Snare for dental consultation.  HPI: Lonnie Hansen is a 66 year old male recently diagnosed with thoracic aortic aneurysm, aortic insufficiency, and coronary artery disease. Patient with anticipated aortic valve replacement, aortic root grafting, and coronary artery bypass graft procedure with Dr. Servando Snare. Patient is now seen as part of a medically necessary pre-heart valve surgery dental protocol to rule out dental infection that may affect the patient's systemic health and anticipated heart valve surgery.  The patient currently denies acute toothaches, swellings, or abscesses. Patient was last seen by dentist "years ago".  Patient had a tooth extracted at that time with no complications. Patient indicates that the dentist was Dr. Clarene Essex who has since retired. Patient indicates that it has been at least 5 years since he was seen by the dentist. Patient does have a maxillary acrylic partial denture that was fabricated by Dr. Carlis Abbott approximately 10 years ago. Patient recently broke denture tooth #9 off of the partial denture. Patient denies having dental phobia.   PROBLEM LIST: Patient Active Problem List   Diagnosis Date Noted  . Aortic insufficiency 04/01/2016    Priority: High  . Thoracic aortic aneurysm ([place]) 09/18/2015    Priority: High  . Coronary artery disease involving native coronary artery of native heart without angina pectoris 04/29/2016  . HTN (hypertension) 09/18/2015  . Hyperlipidemia 09/18/2015  . Asymptomatic bilateral carotid artery stenosis 08/27/2015    PMH: Past Medical History:  Diagnosis Date  . Anemia   . Asymptomatic bilateral carotid artery stenosis 08/2015   1-39%   .  Forgetfulness   . GI bleed   . HLD (hyperlipidemia)   . Hypertension   . Insomnia   . OA (osteoarthritis) of hip     PSH: Past Surgical History:  Procedure Laterality Date  . AORTIC ARCH ANGIOGRAPHY N/A 04/16/2016   Procedure: Aortic Arch Angiography;  Surgeon: [name] Martinique, MD;  Location: [facility];  Service: Cardiovascular;  Laterality: N/A;  . RIGHT/LEFT HEART CATH AND CORONARY ANGIOGRAPHY N/A 04/16/2016   Procedure: Right/Left Heart Cath and Coronary Angiography;  Surgeon: [name] Martinique, MD;  Location: [facility];  Service: Cardiovascular;  Laterality: N/A;    ALLERGIES: No Known Allergies  MEDICATIONS: Current Outpatient Prescriptions  Medication Sig Dispense Refill  . amLODipine (NORVASC) 10 MG tablet Take 10 mg by mouth daily.    . Aspirin-Acetaminophen (GOODY BODY PAIN) 500-325 MG PACK Take by mouth 2 (two) times daily as needed (for hip pain).    Marland Kitchen atenolol (TENORMIN) 25 MG tablet Take 12.5 mg by mouth daily.     . bimatoprost (LUMIGAN) 0.01 % SOLN Place 1 drop into both eyes at bedtime.    . pantoprazole (PROTONIX) 40 MG tablet Take 40 mg by mouth daily as needed.      No current facility-administered medications for this visit.     LABS: Lab Results  Component Value Date   WBC 7.5 04/10/2016   HGB 13.2 04/10/2016   HCT 39.1 04/10/2016   MCV 91.1 04/10/2016   PLT 104 (L) 04/10/2016      Component Value Date/Time   NA 139 04/10/2016 1132   K 3.8 04/10/2016 1132  CL 103 04/10/2016 1132   CO2 26 04/10/2016 1132   GLUCOSE 97 04/10/2016 1132   BUN 11 04/10/2016 1132   CREATININE 0.75 04/10/2016 1132   CALCIUM 9.1 04/10/2016 1132   GFRNONAA >60 04/28/2010 0703   GFRAA  04/28/2010 0703    >60        The eGFR has been calculated using the MDRD equation. This calculation has not been validated in all clinical situations. eGFR's persistently <60 mL/min signify possible Chronic Kidney Disease.   Lab Results  Component Value Date   INR  1.0 04/10/2016   No results found for: PTT  SOCIAL HISTORY: Social History   Social History  . Marital status: Married    Spouse name: N/A  . Number of children: 1  . Years of education: N/A   Occupational History  . Musician    Social History Main Topics  . Smoking status: Former Smoker    Packs/day: 1.00    Years: 25.00    Types: Cigarettes  . Smokeless tobacco: Never Used  . Alcohol use No  . Drug use: No  . Sexual activity: Not on file   Other Topics Concern  . Not on file   Social History Narrative   Married.   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    FAMILY HISTORY: Family History  Problem Relation Age of Onset  . Heart disease Father   . Heart attack Father   . Thyroid disease Sister     REVIEW OF SYSTEMS: Reviewed With patient as per history of present illness.  Psych: The patient denies having dental phobia.   DENTAL HISTORY: CHIEF COMPLAINT: Patient referred by Dr. Servando Snare for dental consultation.  HPI: Lonnie Hansen is a 66 year old male recently diagnosed with thoracic aortic aneurysm, aortic insufficiency, and coronary artery disease. Patient with anticipated aortic valve replacement, aortic root grafting, and coronary artery bypass graft procedure with Dr. Servando Snare. Patient is now seen as part of a medically necessary pre-heart valve surgery dental protocol to rule out dental infection that may affect the patient's systemic health and anticipated heart valve surgery.  The patient currently denies acute toothaches, swellings, or abscesses. Patient was last seen by dentist "years ago".  Patient had a tooth extracted at that time with no complications. Patient indicates that the dentist was Dr. Clarene Essex who has since retired. Patient indicates that it has been at least 5 years since he was seen by the dentist. Patient does have a maxillary acrylic partial denture that was fabricated by Dr. Carlis Abbott approximately 10 years ago. Patient recently  broke denture tooth #9 off of the partial denture. Patient denies having dental phobia.   DENTAL EXAMINATION: GENERAL:The patient well-developed, well-nourished male in no acute distress.  HEAD AND NECK: There is no palpable neck lymphadenopathy. The patient denies acute TMJ symptoms.  INTRAORAL EXAM: The patient has normal saliva. There is no evidence of oral abscess formation. The patient has a mid palatal torus. Patient has multiple lateral exostoses on the buccal aspect in the upper right, lower left, lower right quadrants. Patient also has bilateral mandibular lingual tori. DENTITION: Patient is missing tooth numbers 1, 3, 4, 5, 9, 12, 14, 16, 18, 19, and 32. There are retained root segments in the area of tooth numbers 13, 15, and 29. Patient has significant attrition involving tooth numbers 22 through 28. PERIODONTAL:Patient has chronic periodontitis with plaque and calculus accumulations, generalized gingival recession, and tooth mobility. There is incipient to  moderate bone loss noted.  DENTAL CARIES/SUBOPTIMAL RESTORATIONS:Multiple dental caries and suboptimal dental restorations are noted as per dental charting form. Tooth #8 has extensive caries that are impinging on the pulp. ENDODONTIC: Patient currently denies acute pulpitis symptoms. Patient appears to have periapical pathology and radiolucency associated with the apices of tooth numbers 13, 15, and 29.  CROWN AND BRIDGE: There are crown restorations associated with tooth numbers 2, 6, 7, and 30.  PROSTHODONTIC:Patient has a maxillary acrylic partial denture that was fabricated 10 years ago. Patient has recently broke the denture tooth #9 off of the partial denture. This partial dentures is in need of replacement.  OCCLUSION:Patient has a poor occlusal scheme secondary to multiple missing teeth, multiple retained root segments, lack of replacement of all missing teeth with clinically acceptable dental restorations, and anterior crossbite  area #22 through 27. Tooth #2 has significant occlusal trauma and tooth mobility from opposing dentition. RADIOGRAPHIC INTERPRETATION: An orthopantogram was taken and supplemented with 14 periapical radiographs and one bitewing. There are multiple missing teeth. There is incipient to moderate bone loss. There are multiple retained root segments. There are dental caries noted. There is supra-eruption and drifting of the unopposed teeth into the edentulous areas. Multiple root canal therapies associated with tooth numbers 2, 6, 7, 13, and 30. The patient has periapical pathology associated with tooth numbers 13,15, and 29.  ASSESSMENTS: 1. Aortic insufficiency 2. Thoracic aortic aneurysm 3. Coronary artery disease 4. Pre-heart valve surgery dental protocol examination 5. Chronic apical periodontitis 6. Multiple retained root segments 7. Dental caries 8. Chronic periodontitis with bone loss 9. Gingival recession 10. Accretions 11. Tooth mobility 12. Multiple missing teeth 13. Supra-eruption and drifting of the unopposed teeth into the edentulous areas  14. Poor occlusal scheme and malocclusion 15. Risk for bleeding with invasive dental procedures due to thrombocytopenia  16. Bilateral mandibular lingual tori 17. Multiple lateral exostoses 18. Mid palatal torus near the vibrating line  PLAN/RECOMMENDATIONS: 1. I discussed the risks, benefits, and complications of various treatment options with the patient in relationship to his medical and dental conditions, anticipated heart and heart valve surgery, and risk for endocarditis.  We discussed various treatment options to include no treatment, multiple extractions with alveoloplasty, pre-prosthetic surgery as indicated, periodontal therapy, dental restorations, root canal therapy, crown and bridge therapy, implant therapy, and replacement of missing teeth as indicated. The patient currently wishes to proceed with extraction of tooth numbers 2, 8,  13, 15, 29 with alveoloplasty, pre-prosthetic surgery as needed, and gross debridement of remaining dentition in the operating room with general anesthesia. This has been scheduled for Wednesday, 06/10/2016 at 8:30 AM at [facility]. The patient will then proceed with the anticipated heart valve surgery at the discretion of Dr. Servando Snare after adequate healing. The patient will also need to follow-up with the dentist of his choice for evaluation for replacement of missing teeth and additional crown and dental restorations once medically stable from the anticipated heart valve surgery. Patient will require antibiotic premedication prior to invasive dental procedures after the heart valve surgery.  2. Discussion of findings with medical team and coordination of future medical and dental care as needed.  I spent in excess of  120 minutes during the conduct of this consultation and >50% of this time involved direct face-to-face encounter for counseling and/or coordination of the patient's care.    Lenn Cal, DDS

## 2016-06-04 ENCOUNTER — Encounter (HOSPITAL_COMMUNITY): Payer: Self-pay

## 2016-06-04 ENCOUNTER — Encounter (HOSPITAL_COMMUNITY)
Admission: RE | Admit: 2016-06-04 | Discharge: 2016-06-04 | Disposition: A | Discharge status: Home / Self Care | Payer: Medicare Other | Source: Ambulatory Visit | Attending: Dentistry | Admitting: Dentistry

## 2016-06-04 DIAGNOSIS — E785 Hyperlipidemia, unspecified: Secondary | ICD-10-CM | POA: Diagnosis not present

## 2016-06-04 DIAGNOSIS — Z01812 Encounter for preprocedural laboratory examination: Secondary | ICD-10-CM | POA: Insufficient documentation

## 2016-06-04 DIAGNOSIS — I2510 Atherosclerotic heart disease of native coronary artery without angina pectoris: Secondary | ICD-10-CM | POA: Diagnosis not present

## 2016-06-04 DIAGNOSIS — I10 Essential (primary) hypertension: Secondary | ICD-10-CM | POA: Diagnosis not present

## 2016-06-04 DIAGNOSIS — I712 Thoracic aortic aneurysm, without rupture: Secondary | ICD-10-CM | POA: Insufficient documentation

## 2016-06-04 HISTORY — DX: Unspecified cataract: H26.9

## 2016-06-04 HISTORY — DX: Effusion, unspecified joint: M25.40

## 2016-06-04 HISTORY — DX: Benign prostatic hyperplasia without lower urinary tract symptoms: N40.0

## 2016-06-04 HISTORY — DX: Personal history of colon polyps, unspecified: Z86.0100

## 2016-06-04 HISTORY — DX: Headache: R51

## 2016-06-04 HISTORY — DX: Unspecified osteoarthritis, unspecified site: M19.90

## 2016-06-04 HISTORY — DX: Unspecified glaucoma: H40.9

## 2016-06-04 HISTORY — DX: Other specified disorders of arteries and arterioles: I77.89

## 2016-06-04 HISTORY — DX: Diverticulosis of intestine, part unspecified, without perforation or abscess without bleeding: K57.90

## 2016-06-04 HISTORY — DX: Headache, unspecified: R51.9

## 2016-06-04 HISTORY — DX: Gastro-esophageal reflux disease without esophagitis: K21.9

## 2016-06-04 HISTORY — DX: Hyperlipidemia, unspecified: E78.5

## 2016-06-04 HISTORY — DX: Nocturia: R35.1

## 2016-06-04 HISTORY — DX: Personal history of colonic polyps: Z86.010

## 2016-06-04 HISTORY — DX: Personal history of urinary calculi: Z87.442

## 2016-06-04 HISTORY — DX: Pain in unspecified joint: M25.50

## 2016-06-04 LAB — BASIC METABOLIC PANEL
ANION GAP: 9 (ref 5–15)
BUN: 13 mg/dL (ref 6–20)
CALCIUM: 9.1 mg/dL (ref 8.9–10.3)
CO2: 24 mmol/L (ref 22–32)
Chloride: 105 mmol/L (ref 101–111)
Creatinine, Ser: 0.85 mg/dL (ref 0.61–1.24)
Glucose, Bld: 100 mg/dL — ABNORMAL HIGH (ref 65–99)
Potassium: 3.8 mmol/L (ref 3.5–5.1)
SODIUM: 138 mmol/L (ref 135–145)

## 2016-06-04 LAB — CBC
HCT: 38.5 % — ABNORMAL LOW (ref 39.0–52.0)
HEMOGLOBIN: 12.8 g/dL — AB (ref 13.0–17.0)
MCH: 30.4 pg (ref 26.0–34.0)
MCHC: 33.2 g/dL (ref 30.0–36.0)
MCV: 91.4 fL (ref 78.0–100.0)
Platelets: 101 10*3/uL — ABNORMAL LOW (ref 150–400)
RBC: 4.21 MIL/uL — AB (ref 4.22–5.81)
RDW: 13.7 % (ref 11.5–15.5)
WBC: 6.8 10*3/uL (ref 4.0–10.5)

## 2016-06-04 NOTE — Pre-Procedure Instructions (Signed)
Lonnie Hansen  06/04/2016      [facility] [address] [place], [place] [address] Phone: [phone] Fax: [phone]    Your procedure is scheduled on Wed, May 16 @ 8:30 AM  Report to [facility] Admitting at 6:30 AM  Call this number if you have problems the morning of surgery:  [phone]   Remember:  Do not eat food or drink liquids after midnight.  Take these medicines the morning of surgery with A SIP OF WATER Amlodipine(Norvasc) and Atenolol(Tenormin)             Stop taking your Multivitamin. No Goody's,BC's,Aleve,Advil,Motrin,Ibuprofen,Fish Oil,or any Herbal Medications.    Do not wear jewelry.  Do not wear lotions, powders,colognes, or deoderant.             Men may shave face and neck.  Do not bring valuables to the hospital.  [facility] is not responsible for any belongings or valuables.  Contacts, dentures or bridgework may not be worn into surgery.  Leave your suitcase in the car.  After surgery it may be brought to your room.  For patients admitted to the hospital, discharge time will be determined by your treatment team.  Patients discharged the day of surgery will not be allowed to drive home.    [facility]  Before surgery, you can play an important role.  Because skin is not sterile, your skin needs to be as free of germs as possible.  You can reduce the number of germs on you skin by washing with CHG (chlorahexidine gluconate) soap before surgery.  CHG is an antiseptic cleaner which kills germs and bonds with the skin to continue killing germs even after washing.  Please DO NOT use if you have an allergy to CHG or antibacterial soaps.  If your skin becomes reddened/irritated stop using the CHG and inform your nurse when you arrive at Short Stay.  Do not shave (including legs and underarms) for at least 48 hours prior to the  first CHG shower.  You may shave your face.  Please follow these instructions carefully:   1.  Shower with CHG Soap the night before surgery and the                                morning of Surgery.  2.  If you choose to wash your hair, wash your hair first as usual with your       normal shampoo.  3.  After you shampoo, rinse your hair and body thoroughly to remove the                      Shampoo.  4.  Use CHG as you would any other liquid soap.  You can apply chg directly       to the skin and wash gently with scrungie or a clean washcloth.  5.  Apply the CHG Soap to your body ONLY FROM THE NECK DOWN.        Do not use on open wounds or open sores.  Avoid contact with your eyes,       ears, mouth and genitals (private parts).  Wash genitals (private parts)       with your normal soap.  6.  Wash thoroughly, paying special attention to the  area where your surgery        will be performed.  7.  Thoroughly rinse your body with warm water from the neck down.  8.  DO NOT shower/wash with your normal soap after using and rinsing off       the CHG Soap.  9.  Pat yourself dry with a clean towel.            10.  Wear clean pajamas.            11.  Place clean sheets on your bed the night of your first shower and do not        sleep with pets.  Day of Surgery  Do not apply any lotions/deoderants the morning of surgery.  Please wear clean clothes to the hospital/surgery center.   Please read over the following fact sheets that you were given. Pain Booklet, Coughing and Deep Breathing and Surgical Site Infection Prevention

## 2016-06-04 NOTE — Progress Notes (Signed)
Anesthesia Chart Review:  Pt is a 66 year old male scheduled for multiple dental extractions with alveoloplasty with gross debridement of remaining teeth on 06/10/2016 with Teena Dunk, DDS.  - PCP is [name], MD - Cardiologist is Peter Martinique, MD - CT surgeon is Lanelle Bal, MD  PMH includes:  CAD, HTN, hyperlipidemia, thoracic aortic aneurysm, moderate to severe aortic insufficiency, glaucoma, GERD. Former smoker. BMI 29.5.  Medications include: Amlodipine, atenolol, Lumigan, Protonix  Preoperative labs reviewed.  EKG 04/16/16: NSR. Nonspecific T wave abnormality.  Aortic arch angiography 04/16/16 R & L cardiac cath 04/16/16:   Mid RCA lesion, 80 %stenosed.  Ost LAD to Prox LAD lesion, 25 %stenosed.  Dist LAD lesion, 50 %stenosed.  There is mild left ventricular systolic dysfunction.  LV end diastolic pressure is normal.  The left ventricular ejection fraction is 45-50% by visual estimate.  There is no mitral valve regurgitation.  There is no aortic valve stenosis.  LV end diastolic pressure is normal. 1. 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 Mild LV dysfunction. EF 45%. 3. Ascending thoracic aortic aneurysm. 4. 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 5. Normal Right heart  And LV filling pressures 6. Normal cardiac output.  - Plan: Aortic root grafting and AVR.   CT Angio chest 03/26/16: - Stable aneurysmal dilatation of aortic root is noted at 5.6 cm. - Stable 4.1 cm ascending thoracic aortic aneurysm is noted. Recommend annual imaging followup by CTA or MRA.  - Coronary artery calcifications are noted suggesting coronary disease. - Large gallstone is noted. - Addendum: The aortic root at the level of the sinuses of Valsalva was remeasured at approximately 4.9- 5.2 cm in greatest diameter depending on axis of measurement. This appears stable compared to the prior coronary CTA on 08/28/2015.  Carotid duplex 09/23/15:  - Heterogeneous  plaque, bilaterally. - 1-39% bilateral ICA stenosis. - Normal subclavian arteries, bilaterally. - Patent vertebral arteries with antegrade flow.  Echo 09/20/15:  - Left ventricle: The cavity size was normal. There was moderate focal basal hypertrophy of the septum. Systolic function was normal. The estimated ejection fraction was in the range of 60% to 65%. Wall motion was normal; there were no regional wall motion abnormalities. Doppler parameters are consistent with  abnormal left ventricular relaxation (grade 1 diastolic dysfunction). - Aortic valve: There was moderate to severe regurgitation. - Aorta: Aortic root dimension: 43 mm (ED). - Ascending aorta: The ascending aorta was mildly dilated. - Right ventricle: The cavity size was mildly dilated. Wall thickness was normal.  If no changes, I anticipate pt can proceed with surgery as scheduled.   Willeen Cass, FNP-BC [facility] Short Stay Surgical Center/Anesthesiology Phone: [phone] 06/04/2016 4:50 PM

## 2016-06-04 NOTE — Progress Notes (Addendum)
Cardiologist is Dr.Peter Martinique with last visit in epic from 04/29/16  Medical Md is Dr.Daniel Philip Aspen  Echo report in epic from 2017  Stress test > 5 yrs ago  Heart cath report in epic from 2018  EKG in epic from 04-16-16  CXR denies in past yr

## 2016-06-09 NOTE — Anesthesia Preprocedure Evaluation (Addendum)
Anesthesia Evaluation  Patient identified by MRN, date of birth, ID band Patient awake    Reviewed: Allergy & Precautions, H&P , NPO status , Patient's Chart, lab work & pertinent test results  Airway Mallampati: II  TM Distance: >3 FB Neck ROM: Full    Dental no notable dental hx. (+) Poor Dentition, Dental Advisory Given   Pulmonary neg pulmonary ROS, former smoker,    Pulmonary exam normal breath sounds clear to auscultation       Cardiovascular Exercise Tolerance: Good hypertension, Pt. on medications and Pt. on home beta blockers + Peripheral Vascular Disease   Rhythm:Regular Rate:Normal     Neuro/Psych  Headaches, negative psych ROS   GI/Hepatic Neg liver ROS, GERD  Medicated and Controlled,  Endo/Other  negative endocrine ROS  Renal/GU negative Renal ROS  negative genitourinary   Musculoskeletal  (+) Arthritis , Osteoarthritis,    Abdominal   Peds  Hematology negative hematology ROS (+)   Anesthesia Other Findings   Reproductive/Obstetrics negative OB ROS                            Anesthesia Physical Anesthesia Plan  ASA: III  Anesthesia Plan: General   Post-op Pain Management:    Induction: Intravenous  Airway Management Planned: Nasal ETT and Video Laryngoscope Planned  Additional Equipment:   Intra-op Plan:   Post-operative Plan: Extubation in OR  Informed Consent: I have reviewed the patients History and Physical, chart, labs and discussed the procedure including the risks, benefits and alternatives for the proposed anesthesia with the patient or authorized representative who has indicated his/her understanding and acceptance.   Dental advisory given  Plan Discussed with: CRNA  Anesthesia Plan Comments:         Anesthesia Quick Evaluation

## 2016-06-10 ENCOUNTER — Encounter (HOSPITAL_COMMUNITY)
Admission: RE | Disposition: A | Discharge status: Home / Self Care | Payer: Self-pay | Source: Ambulatory Visit | Attending: Dentistry

## 2016-06-10 ENCOUNTER — Ambulatory Visit (HOSPITAL_COMMUNITY): Payer: Medicare Other | Admitting: Emergency Medicine

## 2016-06-10 ENCOUNTER — Encounter (HOSPITAL_COMMUNITY): Payer: Self-pay | Admitting: Certified Registered Nurse Anesthetist

## 2016-06-10 ENCOUNTER — Ambulatory Visit (HOSPITAL_COMMUNITY): Payer: Medicare Other | Admitting: Anesthesiology

## 2016-06-10 ENCOUNTER — Ambulatory Visit (HOSPITAL_COMMUNITY)
Admission: RE | Admit: 2016-06-10 | Discharge: 2016-06-10 | Disposition: A | Discharge status: Home / Self Care | Payer: Medicare Other | Source: Ambulatory Visit | Attending: Dentistry | Admitting: Dentistry

## 2016-06-10 DIAGNOSIS — I712 Thoracic aortic aneurysm, without rupture: Secondary | ICD-10-CM | POA: Insufficient documentation

## 2016-06-10 DIAGNOSIS — Z7982 Long term (current) use of aspirin: Secondary | ICD-10-CM | POA: Insufficient documentation

## 2016-06-10 DIAGNOSIS — I739 Peripheral vascular disease, unspecified: Secondary | ICD-10-CM | POA: Diagnosis not present

## 2016-06-10 DIAGNOSIS — Z87891 Personal history of nicotine dependence: Secondary | ICD-10-CM | POA: Diagnosis not present

## 2016-06-10 DIAGNOSIS — E785 Hyperlipidemia, unspecified: Secondary | ICD-10-CM | POA: Diagnosis not present

## 2016-06-10 DIAGNOSIS — K8020 Calculus of gallbladder without cholecystitis without obstruction: Secondary | ICD-10-CM | POA: Diagnosis not present

## 2016-06-10 DIAGNOSIS — Z9889 Other specified postprocedural states: Secondary | ICD-10-CM | POA: Diagnosis not present

## 2016-06-10 DIAGNOSIS — I2510 Atherosclerotic heart disease of native coronary artery without angina pectoris: Secondary | ICD-10-CM | POA: Insufficient documentation

## 2016-06-10 DIAGNOSIS — M278 Other specified diseases of jaws: Secondary | ICD-10-CM | POA: Insufficient documentation

## 2016-06-10 DIAGNOSIS — I351 Nonrheumatic aortic (valve) insufficiency: Secondary | ICD-10-CM

## 2016-06-10 DIAGNOSIS — K0530 Chronic periodontitis, unspecified: Secondary | ICD-10-CM | POA: Diagnosis not present

## 2016-06-10 DIAGNOSIS — M1610 Unilateral primary osteoarthritis, unspecified hip: Secondary | ICD-10-CM | POA: Diagnosis not present

## 2016-06-10 DIAGNOSIS — K029 Dental caries, unspecified: Secondary | ICD-10-CM | POA: Diagnosis not present

## 2016-06-10 DIAGNOSIS — K219 Gastro-esophageal reflux disease without esophagitis: Secondary | ICD-10-CM | POA: Insufficient documentation

## 2016-06-10 DIAGNOSIS — I10 Essential (primary) hypertension: Secondary | ICD-10-CM | POA: Insufficient documentation

## 2016-06-10 DIAGNOSIS — K083 Retained dental root: Secondary | ICD-10-CM | POA: Insufficient documentation

## 2016-06-10 DIAGNOSIS — Z79899 Other long term (current) drug therapy: Secondary | ICD-10-CM | POA: Insufficient documentation

## 2016-06-10 DIAGNOSIS — G4700 Insomnia, unspecified: Secondary | ICD-10-CM | POA: Diagnosis not present

## 2016-06-10 DIAGNOSIS — K045 Chronic apical periodontitis: Secondary | ICD-10-CM

## 2016-06-10 DIAGNOSIS — K036 Deposits [accretions] on teeth: Secondary | ICD-10-CM | POA: Diagnosis not present

## 2016-06-10 DIAGNOSIS — I350 Nonrheumatic aortic (valve) stenosis: Secondary | ICD-10-CM | POA: Diagnosis not present

## 2016-06-10 HISTORY — PX: MULTIPLE EXTRACTIONS WITH ALVEOLOPLASTY: SHX5342

## 2016-06-10 SURGERY — MULTIPLE EXTRACTION WITH ALVEOLOPLASTY
Anesthesia: General

## 2016-06-10 MED ORDER — CEFAZOLIN SODIUM-DEXTROSE 2-4 GM/100ML-% IV SOLN
INTRAVENOUS | Status: AC
  Filled 2016-06-10: qty 100

## 2016-06-10 MED ORDER — LIDOCAINE HCL (CARDIAC) 20 MG/ML IV SOLN
INTRAVENOUS | Status: DC | PRN
  Administered 2016-06-10: 60 mg via INTRAVENOUS

## 2016-06-10 MED ORDER — OXYMETAZOLINE HCL 0.05 % NA SOLN
NASAL | Status: DC | PRN
  Administered 2016-06-10: 2 via NASAL

## 2016-06-10 MED ORDER — BUPIVACAINE-EPINEPHRINE (PF) 0.5% -1:200000 IJ SOLN
INTRAMUSCULAR | Status: AC
  Filled 2016-06-10: qty 3.6

## 2016-06-10 MED ORDER — OXYCODONE-ACETAMINOPHEN 5-325 MG PO TABS: ORAL_TABLET | ORAL | 0 refills | Status: DC

## 2016-06-10 MED ORDER — LACTATED RINGERS IV SOLN
INTRAVENOUS | Status: DC | PRN
  Administered 2016-06-10: 08:00:00 via INTRAVENOUS

## 2016-06-10 MED ORDER — LIDOCAINE-EPINEPHRINE 2 %-1:100000 IJ SOLN
INTRAMUSCULAR | Status: DC | PRN
Start: 2016-06-10 — End: 2016-06-10
  Administered 2016-06-10: 1.7 mL via INTRADERMAL

## 2016-06-10 MED ORDER — SUCCINYLCHOLINE CHLORIDE 200 MG/10ML IV SOSY
PREFILLED_SYRINGE | INTRAVENOUS | Status: AC
  Filled 2016-06-10: qty 10

## 2016-06-10 MED ORDER — LIDOCAINE-EPINEPHRINE 2 %-1:100000 IJ SOLN
INTRAMUSCULAR | Status: AC
  Filled 2016-06-10: qty 10.2

## 2016-06-10 MED ORDER — MIDAZOLAM HCL 5 MG/5ML IJ SOLN
INTRAMUSCULAR | Status: DC | PRN
Start: 2016-06-10 — End: 2016-06-10
  Administered 2016-06-10: 2 mg via INTRAVENOUS

## 2016-06-10 MED ORDER — EPHEDRINE 5 MG/ML INJ
INTRAVENOUS | Status: AC
  Filled 2016-06-10: qty 10

## 2016-06-10 MED ORDER — LIDOCAINE 2% (20 MG/ML) 5 ML SYRINGE
INTRAMUSCULAR | Status: AC
  Filled 2016-06-10: qty 5

## 2016-06-10 MED ORDER — MORPHINE SULFATE (PF) 2 MG/ML IV SOLN: 2.0000 mg | INTRAVENOUS | Status: DC | PRN

## 2016-06-10 MED ORDER — HYDROMORPHONE HCL 1 MG/ML IJ SOLN: 0.2500 mg | INTRAMUSCULAR | Status: DC | PRN

## 2016-06-10 MED ORDER — CEFAZOLIN SODIUM-DEXTROSE 2-4 GM/100ML-% IV SOLN
2.0000 g | Freq: Once | INTRAVENOUS | Status: AC
  Administered 2016-06-10: 2 g via INTRAVENOUS
  Filled 2016-06-10: qty 100

## 2016-06-10 MED ORDER — PROPOFOL 10 MG/ML IV BOLUS
INTRAVENOUS | Status: DC | PRN
  Administered 2016-06-10: 20 mg via INTRAVENOUS
  Administered 2016-06-10: 150 mg via INTRAVENOUS

## 2016-06-10 MED ORDER — 0.9 % SODIUM CHLORIDE (POUR BTL) OPTIME
TOPICAL | Status: DC | PRN
  Administered 2016-06-10: 1000 mL

## 2016-06-10 MED ORDER — OXYMETAZOLINE HCL 0.05 % NA SOLN
NASAL | Status: AC
  Filled 2016-06-10: qty 15

## 2016-06-10 MED ORDER — HEMOSTATIC AGENTS (NO CHARGE) OPTIME
TOPICAL | Status: DC | PRN
  Administered 2016-06-10: 1 via TOPICAL

## 2016-06-10 MED ORDER — MIDAZOLAM HCL 2 MG/2ML IJ SOLN
INTRAMUSCULAR | Status: AC
  Filled 2016-06-10: qty 2

## 2016-06-10 MED ORDER — EPHEDRINE SULFATE 50 MG/ML IJ SOLN
INTRAMUSCULAR | Status: DC | PRN
  Administered 2016-06-10: 5 mg via INTRAVENOUS
  Administered 2016-06-10: 10 mg via INTRAVENOUS
  Administered 2016-06-10: 5 mg via INTRAVENOUS
  Administered 2016-06-10: 10 mg via INTRAVENOUS

## 2016-06-10 MED ORDER — PHENYLEPHRINE HCL 10 MG/ML IJ SOLN
INTRAVENOUS | Status: DC | PRN
  Administered 2016-06-10: 20 ug/min via INTRAVENOUS

## 2016-06-10 MED ORDER — AMINOCAPROIC ACID SOLUTION 5% (50 MG/ML)
10.0000 mL | ORAL | Status: DC
  Administered 2016-06-10: 10 mL via ORAL
  Filled 2016-06-10: qty 100

## 2016-06-10 MED ORDER — BUPIVACAINE-EPINEPHRINE 0.5% -1:200000 IJ SOLN
INTRAMUSCULAR | Status: DC | PRN
  Administered 2016-06-10: 7.2 mL

## 2016-06-10 MED ORDER — ONDANSETRON HCL 4 MG/2ML IJ SOLN
INTRAMUSCULAR | Status: DC | PRN
  Administered 2016-06-10: 4 mg via INTRAVENOUS

## 2016-06-10 MED ORDER — FENTANYL CITRATE (PF) 100 MCG/2ML IJ SOLN
INTRAMUSCULAR | Status: DC | PRN
  Administered 2016-06-10: 100 ug via INTRAVENOUS

## 2016-06-10 MED ORDER — LACTATED RINGERS IV SOLN: INTRAVENOUS | Status: DC

## 2016-06-10 MED ORDER — OXYCODONE-ACETAMINOPHEN 5-325 MG PO TABS: 1.0000 | ORAL_TABLET | ORAL | Status: DC | PRN

## 2016-06-10 MED ORDER — ONDANSETRON HCL 4 MG/2ML IJ SOLN
INTRAMUSCULAR | Status: AC
  Filled 2016-06-10: qty 2

## 2016-06-10 MED ORDER — PROPOFOL 10 MG/ML IV BOLUS
INTRAVENOUS | Status: AC
  Filled 2016-06-10: qty 40

## 2016-06-10 MED ORDER — SUCCINYLCHOLINE CHLORIDE 20 MG/ML IJ SOLN
INTRAMUSCULAR | Status: DC | PRN
  Administered 2016-06-10: 100 mg via INTRAVENOUS

## 2016-06-10 MED ORDER — FENTANYL CITRATE (PF) 250 MCG/5ML IJ SOLN
INTRAMUSCULAR | Status: AC
  Filled 2016-06-10: qty 5

## 2016-06-10 SURGICAL SUPPLY — 36 items
ALCOHOL 70% 16 OZ (MISCELLANEOUS) ×2 IMPLANT
ATTRACTOMAT 16X20 MAGNETIC DRP (DRAPES) ×2 IMPLANT
BLADE SURG 15 STRL LF DISP TIS (BLADE) ×2 IMPLANT
BLADE SURG 15 STRL SS (BLADE) ×4
COVER SURGICAL LIGHT HANDLE (MISCELLANEOUS) ×2 IMPLANT
GAUZE PACKING FOLDED 2  STR (GAUZE/BANDAGES/DRESSINGS) ×1
GAUZE PACKING FOLDED 2 STR (GAUZE/BANDAGES/DRESSINGS) ×1 IMPLANT
GAUZE SPONGE 4X4 16PLY XRAY LF (GAUZE/BANDAGES/DRESSINGS) ×3 IMPLANT
GLOVE BIOGEL PI IND STRL 6 (GLOVE) ×1 IMPLANT
GLOVE BIOGEL PI INDICATOR 6 (GLOVE) ×1
GLOVE SURG ORTHO 8.0 STRL STRW (GLOVE) ×2 IMPLANT
GLOVE SURG SS PI 6.0 STRL IVOR (GLOVE) ×2 IMPLANT
GOWN STRL REUS W/ TWL LRG LVL3 (GOWN DISPOSABLE) ×1 IMPLANT
GOWN STRL REUS W/TWL 2XL LVL3 (GOWN DISPOSABLE) ×2 IMPLANT
GOWN STRL REUS W/TWL LRG LVL3 (GOWN DISPOSABLE) ×2
HEMOSTAT SURGICEL 2X14 (HEMOSTASIS) ×2 IMPLANT
KIT BASIN OR (CUSTOM PROCEDURE TRAY) ×2 IMPLANT
KIT ROOM TURNOVER OR (KITS) ×2 IMPLANT
MANIFOLD NEPTUNE WASTE (CANNULA) ×1 IMPLANT
NDL BLUNT 16X1.5 OR ONLY (NEEDLE) ×1 IMPLANT
NEEDLE BLUNT 16X1.5 OR ONLY (NEEDLE) ×2 IMPLANT
NS IRRIG 1000ML POUR BTL (IV SOLUTION) ×2 IMPLANT
PACK EENT II TURBAN DRAPE (CUSTOM PROCEDURE TRAY) ×2 IMPLANT
PAD ARMBOARD 7.5X6 YLW CONV (MISCELLANEOUS) ×2 IMPLANT
SPONGE SURGIFOAM ABS GEL 100 (HEMOSTASIS) IMPLANT
SPONGE SURGIFOAM ABS GEL 12-7 (HEMOSTASIS) IMPLANT
SPONGE SURGIFOAM ABS GEL SZ50 (HEMOSTASIS) IMPLANT
SUCTION FRAZIER HANDLE 10FR (MISCELLANEOUS) ×1
SUCTION TUBE FRAZIER 10FR DISP (MISCELLANEOUS) ×1 IMPLANT
SUT CHROMIC 3 0 PS 2 (SUTURE) ×5 IMPLANT
SUT CHROMIC 4 0 P 3 18 (SUTURE) IMPLANT
SYR 50ML SLIP (SYRINGE) ×2 IMPLANT
TOWEL OR 17X26 10 PK STRL BLUE (TOWEL DISPOSABLE) ×2 IMPLANT
TUBE CONNECTING 12X1/4 (SUCTIONS) ×2 IMPLANT
WATER TABLETS ICX (MISCELLANEOUS) ×2 IMPLANT
YANKAUER SUCT BULB TIP NO VENT (SUCTIONS) ×2 IMPLANT

## 2016-06-10 NOTE — Transfer of Care (Signed)
Immediate Anesthesia Transfer of Care Note  Patient: Lonnie Hansen  Procedure(s) Performed: Procedure(s): Extraction of tooth #'s 2,8,13,15, and 29  with alveoloplasty, maxillary right and left buccal exostoses reductions, and gross debridement of remaining teeth. (N/A)  Patient Location: PACU  Anesthesia Type:General  Level of Consciousness: awake, alert , patient cooperative and responds to stimulation  Airway & Oxygen Therapy: Patient Spontanous Breathing and Patient connected to face mask oxygen  Post-op Assessment: Report given to RN, Post -op Vital signs reviewed and stable and Patient moving all extremities X 4  Post vital signs: Reviewed and stable  Last Vitals:  Vitals:   06/10/16 0643 06/10/16 1045  BP: (!) 137/106   Pulse: (!) 51   Resp: 20   Temp: 36.8 C (P) 36.5 C    Last Pain:  Vitals:   06/10/16 0643  TempSrc: Oral         Complications: No apparent anesthesia complications

## 2016-06-10 NOTE — H&P (Signed)
06/10/2016  Patient:            Lonnie Hansen Date of Birth:  10-01-1950 MRN:                [id number]   BP (!) 137/106   Pulse (!) 51   Temp 98.3 F (36.8 C) (Oral)   Resp 20   SpO2 100%   Lonnie Hansen this is a 66 year old male with a thoracic aortic aneurysm, aortic insufficiency, coronary artery disease. Patient was seen as part of heart valve surgery dental protocol. Patient now presents for multiple dental extractions with alveoloplasty, pre-prosthetic surgery as needed, and gross debridement remaining dentition the operative room general anesthesia. The patient denies any acute medical or dental changes. Please see note from Dr. Martinique to act as H&P for dental operating room procedure.   Lonnie Hansen, DDS   Martinique, [name], MD  Cardiology  Expand All Collapse All   [] Hide copied text    Cardiology Office Note    Date:  04/29/2016   ID:  Lonnie Hansen, Lonnie Hansen 02-23-1950, MRN [id number]  PCP:  [name], MD     Cardiologist:  Peter Martinique, MD    History of Present Illness:  Lonnie Hansen is a 66 y.o. male seen for follow up s/p cardiac cath.  He has a history of HLD and HTN. In 2012 he was admitted with a syncopal spell. He ruled out for MI. He was hypokalemic. He had a follow up stress test as an outpatient with Dr. Debara Pickett that was apparently OK. More recently he was planning to have hip replacement. CXR showed aortic enlargement. He then underwent CT that showed dilation of the proximal aorta. He has been seen by Dr. Servando Snare. CT of the aorta showed mild aortic root enlargement with maximal diameter 4.5 cm. The descending aorta was normal. He denies any chest pain or SOB. He had an Echocardiogram that showed normal LV function and moderate to severe AI. Carotid dopplers showed mild nonobstructive disease.   More recently evaluation with Dr. Servando Snare CT showed aortic root enlargement to 5.2 cm. Given size of root and degree of AI it is recommended that he  undergo Aortic root grafting and AVR in next 8-12 months. He underwent pre op evaluation with cardiac cath on 04/16/16. This showed moderate to severe AI 3+. LV dysfunction with EF 45%. 80% stenosis in RCA and 50% stenosis in distal LAD. He has seen Dr. Servando Snare back with plans to proceed with surgery once he has completed dental evaluation.   He denies any dizziness, palpitations, or syncope.He denies any chest pain or dyspnea.        Past Medical History:  Diagnosis Date  . Anemia   . Asymptomatic bilateral carotid artery stenosis 08/2015   1-39%   . Forgetfulness   . GI bleed   . HLD (hyperlipidemia)   . Hypertension   . Insomnia   . OA (osteoarthritis) of hip          Past Surgical History:  Procedure Laterality Date  . AORTIC ARCH ANGIOGRAPHY N/A 04/16/2016   Procedure: Aortic Arch Angiography;  Surgeon: Peter Hansen Martinique, MD;  Location: [facility];  Service: Cardiovascular;  Laterality: N/A;  . RIGHT/LEFT HEART CATH AND CORONARY ANGIOGRAPHY N/A 04/16/2016   Procedure: Right/Left Heart Cath and Coronary Angiography;  Surgeon: Peter Hansen Martinique, MD;  Location: [facility];  Service: Cardiovascular;  Laterality: N/A;    Current Medications:  Outpatient Medications Prior to Visit  Medication Sig Dispense Refill  . amLODipine (NORVASC) 10 MG tablet Take 10 mg by mouth daily.    . Aspirin-Acetaminophen (GOODY BODY PAIN) 500-325 MG PACK Take by mouth 2 (two) times daily as needed (for hip pain).    Marland Kitchen atenolol (TENORMIN) 25 MG tablet Take 12.5 mg by mouth daily.     . bimatoprost (LUMIGAN) 0.01 % SOLN Place 1 drop into both eyes at bedtime.    . pantoprazole (PROTONIX) 40 MG tablet Take 40 mg by mouth daily as needed.      No facility-administered medications prior to visit.      Allergies:   Patient has no known allergies.   Social History        Social History  . Marital status: Married    Spouse name: N/A  . Number of  children: N/A  . Years of education: N/A        Social History Main Topics  . Smoking status: Former Smoker    Packs/day: 1.00    Years: 25.00    Types: Cigarettes  . Smokeless tobacco: Never Used  . Alcohol use No  . Drug use: No  . Sexual activity: Not Asked       Other Topics Concern  . None      Social History Narrative   Married.   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     Family History:  The patient's family history includes Heart attack in his father; Heart disease in his father; Thyroid disease in his sister.   ROS:   Please see the history of present illness.    ROS All other systems reviewed and are negative.   PHYSICAL EXAM:   VS:  BP (!) 147/82 (BP Location: Left Arm)   Pulse 70   Ht 5\' 9"  (1.753 Hansen)   Wt 197 lb 9.6 oz (89.6 kg)   BMI 29.18 kg/Hansen    GEN: Well nourished, well developed, in no acute distress  HEENT: normal, voice is hoarse.  Neck: no JVD, carotid bruits, or masses Cardiac: RRR; soft 1/6  Diastolic murmur LSB.  Respiratory:  clear to auscultation bilaterally, normal work of breathing GI: soft, nontender, nondistended, + BS MS: no deformity or atrophy, No radial site hematoma. Skin: warm and dry, no rash Neuro:  Alert and Oriented x 3, Strength and sensation are intact Psych: euthymic mood, full affect     Wt Readings from Last 3 Encounters:  04/29/16 197 lb 9.6 oz (89.6 kg)  04/22/16 197 lb (89.4 kg)  04/16/16 197 lb (89.4 kg)      Studies/Labs Reviewed:   EKG:  EKG is not ordered today.  The ekg ordered today demonstrates N/A   Recent Labs: 04/10/2016: BUN 11; Creat 0.75; Hemoglobin 13.2; Platelets 104; Potassium 3.8; Sodium 139   Lipid Panel Labs(Brief)           Component Value Date/Time   CHOL  04/28/2010 0703    187        ATP III CLASSIFICATION:  <200     mg/dL   Desirable  200-239  mg/dL   Borderline High  >=240    mg/dL   High          TRIG 69 04/28/2010 0703   HDL 54  04/28/2010 0703   CHOLHDL 3.5 04/28/2010 0703   VLDL 14 04/28/2010 0703   LDLCALC (H) 04/28/2010 0703    119  Total Cholesterol/HDL:CHD Risk Coronary Heart Disease Risk Table                     Men   Women  1/2 Average Risk   3.4   3.3  Average Risk       5.0   4.4  2 X Average Risk   9.6   7.1  3 X Average Risk  23.4   11.0        Use the calculated Patient Ratio above and the CHD Risk Table to determine the patient's CHD Risk.        ATP III CLASSIFICATION (LDL):  <100     mg/dL   Optimal  100-129  mg/dL   Near or Above                    Optimal  130-159  mg/dL   Borderline  160-189  mg/dL   High  >190     mg/dL   Very High     Echo 09/20/15: Study Conclusions  - Left ventricle: The cavity size was normal. There was moderate focal basal hypertrophy of the septum. Systolic function was normal. The estimated ejection fraction was in the range of 60% to 65%. Wall motion was normal; there were no regional wall motion abnormalities. Doppler parameters are consistent with abnormal left ventricular relaxation (grade 1 diastolic dysfunction). - Aortic valve: There was moderate to severe regurgitation. - Aorta: Aortic root dimension: 43 mm (ED). - Ascending aorta: The ascending aorta was mildly dilated. - Right ventricle: The cavity size was mildly dilated. Wall thickness was normal.   ADDENDUM REPORT: 03/29/2016 14:08  ADDENDUM: The aortic root at the level of the sinuses of Valsalva was remeasured at approximately 4.9- 5.2 cm in greatest diameter depending on axis of measurement. This appears stable compared to the prior coronary CTA on 08/28/2015.   Electronically Signed By: Aletta Edouard Hansen.D. On: 03/29/2016 14:08   Addended by Aletta Edouard, MD on 03/29/2016 2:11 PM    Study Result   CLINICAL DATA: Thoracic aortic aneurysm without rupture.  EXAM: CT ANGIOGRAPHY CHEST WITH CONTRAST  TECHNIQUE: Multidetector CT  imaging of the chest was performed using the standard protocol during bolus administration of intravenous contrast. Multiplanar CT image reconstructions and MIPs were obtained to evaluate the vascular anatomy.  CONTRAST: 75 mL of Isovue 370 intravenously.  COMPARISON: CT scan of July 24, 2015.  FINDINGS: Cardiovascular: There is no evidence of thoracic aortic dissection. Great vessels are widely patent without significant stenosis. Aneurysmal aortic root is noted measured at 5.6 cm. Proximal ascending thoracic aorta measures 4.1 cm in diameter. Transverse aortic arch measures 4.0 cm. Descending thoracic aorta has maximum measured diameter of 3.9 cm. Coronary artery calcifications are noted.  Mediastinum/Nodes: No enlarged mediastinal, hilar, or axillary lymph nodes. Thyroid gland, trachea, and esophagus demonstrate no significant findings.  Lungs/Pleura: Lungs are clear. No pleural effusion or pneumothorax.  Upper Abdomen: Large gallstone is noted. Stable partially calcified left renal cyst is noted.  Musculoskeletal: No chest wall abnormality. No acute or significant osseous findings.  Review of the MIP images confirms the above findings.  IMPRESSION: Stable aneurysmal dilatation of aortic root is noted at 5.6 cm. 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 Recommend annual imaging followup by CTA or MRA. This recommendation follows 2010 ACCF/AHA/AATS/ACR/ASA/SCA/SCAI/SIR/STS/SVM Guidelines for the Diagnosis and Management of Patients with Thoracic Aortic Disease. Circulation. 2010; 121: I951-O841.  Coronary artery calcifications are noted suggesting  coronary disease.  Large gallstone is noted.  Electronically Signed: By: Marijo Conception, Hansen.D. On: 03/26/2016 15:37       Conclusion     Mid RCA lesion, 80 %stenosed.  Ost LAD to Prox LAD lesion, 25 %stenosed.  Dist LAD lesion, 50 %stenosed.  There is mild left ventricular  systolic dysfunction.  LV end diastolic pressure is normal.  The left ventricular ejection fraction is 45-50% by visual estimate.  There is no mitral valve regurgitation.  There is no aortic valve stenosis.  LV end diastolic pressure is normal.  1. 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 Mild LV dysfunction. EF 45%. 3. Ascending thoracic aortic aneurysm. 4. 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 5. Normal Right heart And LV filling pressures 6. Normal cardiac output.   Plan: Aortic root grafting and AVR.        ASSESSMENT:    1. Thoracic aortic aneurysm without rupture ([place])   2. Essential hypertension   3. Coronary artery disease involving native coronary artery of native heart without angina pectoris      PLAN:  In order of problems listed above:  1. Patient has thoracic aortic aneurysm measuring 5.2 cm. 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 He has single vessel obstructive CAD.  BP is well controlled. We discussed his diagnosis and the recommended therapy for aortic grafting, AVR and CABG. He was referred to Dr Enrique Sack by Dr Servando Snare for dental evaluation. 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 I will plan on follow up after. 2. Severe osteoarthritis of the hip.  3. HTN controlled.     Medication Adjustments/Labs and Tests Ordered: Current medicines are reviewed at length with the patient today.  Concerns regarding medicines are outlined above.  Medication changes, Labs and Tests ordered today are listed in the Patient Instructions below. Patient Instructions  Continue your current therapy  I will see you in 6 months.      Signed, Peter Martinique, MD  04/29/2016 12:58 PM    [facility] [address], [place], [place], [address] [phone]

## 2016-06-10 NOTE — Op Note (Signed)
OPERATIVE REPORT  Patient:            Lonnie Hansen Date of Birth:  08-08-1950 MRN:                [id number]   DATE OF PROCEDURE:  06/10/2016  PREOPERATIVE DIAGNOSES: 1.  Aortic insufficiency 2. Pre-heart valve surgery dental protocol 3. Chronic apical periodontitis 4. Retained root segments 5. Dental caries 6. Chronic periodontitis 7. Accretions 8. Lateral exostoses  POSTOPERATIVE DIAGNOSES: 1.  Aortic insufficiency 2. Pre-heart valve surgery dental protocol 3. Chronic apical periodontitis 4. Retained root segments 5. Dental caries 6. Chronic periodontitis 7. Accretions 8. Lateral exostoses  OPERATIONS: 1. Multiple extraction of tooth numbers 2,8,13,15,and 29 2. Three Quadrants of alveoloplasty 3. Bilateral maxillary lateral exostoses reductions 4. Gross debridement of remaining dentition   SURGEON: Lenn Cal, DDS  ASSISTANT: Camie Patience, (dental assistant)  ANESTHESIA: General anesthesia via nasoendotracheal tube.  MEDICATIONS: 1. Ancef 2 g IV prior to invasive dental procedures. 2. Local anesthesia with a total utilization of 4 carpules each containing 34 mg of lidocaine with 0.017 mg of epinephrine as well as 1 carpules each containing 9 mg of bupivacaine with 0.009 mg of epinephrine.  SPECIMENS: There are 5 teeth that were discarded.  DRAINS: None  CULTURES: None  COMPLICATIONS: None   ESTIMATED BLOOD LOSS: 100 mLs.  INTRAVENOUS FLUIDS: 850 mLs of Lactated ringers solution.  INDICATIONS: The patient was recently diagnosed with aortic insufficiency.  A medically necessary dental consultation was then requested as part of a heart valve surgery dental protocol.  The patient was examined and treatment planned for multiple dental extractions with alveoloplasty, lateral exostoses reductions, and gross debridement of remaining teeth in the OR with general anesthesia.  This treatment plan was formulated to decrease the risks and complications  associated with dental infection from affecting the patient's systemic health and the anticipated heart valve surgery.  OPERATIVE FINDINGS: Patient was examined operating room number 14.  The teeth were identified for extraction. The patient was noted to be affected by chronic apical periodontitis, dental caries, retained roots, lateral exostoses, chronic periodontitis, and accretions.   DESCRIPTION OF PROCEDURE: Patient was brought to the main operating room number 14. Patient was then placed in the supine position on the operating table. General anesthesia was then induced per the anesthesia team. The patient was then prepped and draped in the usual manner for dental medicine procedure. A timeout was performed. The patient was identified and procedures were verified. A throat pack was placed at this time. The oral cavity was then thoroughly examined with the findings noted above. The patient was then ready for dental medicine procedure as follows:  Local anesthesia was then administered sequentially with a total utilization of 4 carpules each containing 34 mg of lidocaine with 0.017 mg of epinephrine as well as 1 carpules  each containing 9 mg bupivacaine with 0.009 mg of epinephrine.  The Maxillary left and right quadrants first approached. Anesthesia was then delivered utilizing infiltration with lidocaine with epinephrine. A #15 blade incision was then made from the maxillary right tuberosity and extended the distal of #6.  Second 15 blade incision was made from the mesial of #10 and extended to the mesial of #7 . Surgical flaps were then carefully reflected. Tooth #8 was then removed with a 150 forceps without complications. Tooth #2 was then subluxated and the coronal aspect was removed with a 150 forceps. Further bone was then removed with a surgical handpiece and bur  and copious amounts sterile saline around retained roots #2. The retained roots were then removed with a series of cryers elevators  without complication. The flap was then further reflected on the buccal and palatal aspects to expose the significant lateral exostoses.  These lateral exostoses were then reduced utilizing a series of rongeurs and a bone file. Alveoloplasty was then performed utilizing a rongeurs and bone file to help achieve primary closure. The surgical sites were then irrigated with copious amounts of sterile saline 2. The tissues were approximated and trimmed appropriately. A piece of Surgicel was placed in the extraction socket area #2 and #8 appropriately. The maxillary right surgical site was then closed from the maxillary right tuberosity and extended to the distal of #6 utilizing 3-0 chromic gut suture in a continuous interrupted suture technique 1. The maxillary anterior was then closed from the mesial of #10 extended the mesial number 7 utilizing 3-0 chromic gut suture in a continuous interrupted suture technique 1. One individual suture was then placed to further close the surgical site.   The maxillary left quadrant was approached. A 15 blade incision was made from the maxillary left tuberosity and extended to the mesial of #12. A surgical flap was then carefully reflected. Tooth numbers 13 and 15 was subluxated with a series of straight elevators.  Tooth #13 was then removed with a 150 forceps without complications. Further bone was then removed around tooth number #15 surgical handpiece and bur and copious amounts of sterile water. The tooth was then subluxated and removed with a 53L forceps without complications. The surgical flap was then further reflected on the buccal aspect to expose the lateral exostoses. These exostoses were then reduced utilizing a rongeur and bone file appropriately. Further alveoloplasty was then performed utilizing a rongeurs and bone file to help achieve primary closure in the surgical site. Tissues were approximated and trimmed appropriately. The surgical sites were then irrigated  with copious amounts of sterile saline. A piece of Surgicel was placed in the extraction sockets appropriately. The maxillary left surgical site was then closed utilizing 3-0 chromic gut suture in a continuous interrupted suture technique from the maxillary left tuberosity and extended to the mesial of #12 utilizing 3-0 chromic gut suture in a continuous interrupted suture technique 1.  At this point time, the mandibular quadrants were approached. The patient was given an alveolar nerve block and long buccal nerve block on the right side utilizing the bupivacaine with epinephrine. Further infiltration was then achieved utilizing the lidocaine with epinephrine. A 15 blade incision was then made from the distal of number 29 and extended to the distal of #27. A surgical flap was then carefully reflected. Appropriate amounts of buccal and interseptal bone were then removed utilizing a surgical handpiece and copious amount of sterile water around retained root segment 29. Tooth number 29 was then removed with a cryers elevators without complication. Alveoloplasty was then performed utilizing a rongeurs and bone file. The surgical site was irrigated with copious amounts of sterile saline. Surgicel was placed in the extraction socket. The surgical site was then closed from the mesial #30 and extended distal of #28 utilizing 3-0 chromic gut suture in a continuous interrupted suture technique 1. An interproximal suture was then placed between tooth numbers 28 and 27 appropriately with 3-0 chromic gut material.   At this point in time, a sonic scaler was used to remove significant accretions. A series of hand curettes were used to further remove accretions. A sonic scaler  was then again used to further refine removal of accretions. This completed the gross debridement procedure.   At this point time, the entire mouth was irrigated with copious amounts of sterile saline. The patient was examined for complications,  seeing none, the dental medicine procedure was deemed to be complete. The throat pack was removed at this time. An oral airway was then placed at the request of the anesthesia team. A series of 4 x 4 gauze moistened with Amicar 5% rinse were placed in the mouth to aid hemostasis. The patient was then handed over to the anesthesia team for final disposition. After an appropriate amount of time, the patient was extubated and taken to the postanesthsia care unit in good condition. All counts were correct for the dental medicine procedure. The patient is to continue the Amicar 5% rinse post operatively. The patient is to rinse with 10 ML's every hour for the next 10 hours in a swish and spit manner. Patient is to use Percocet 5/325 for pain. Patient is to take one to 2 tablets by mouth every 6 hours as needed for moderate to severe pain.   Lenn Cal, DDS.

## 2016-06-10 NOTE — Anesthesia Postprocedure Evaluation (Signed)
Anesthesia Post Note  Patient: FIRAS GUARDADO  Procedure(s) Performed: Procedure(s) (LRB): Extraction of tooth #'s 2,8,13,15, and 29  with alveoloplasty, maxillary right and left buccal exostoses reductions, and gross debridement of remaining teeth. (N/A)  Patient location during evaluation: PACU Anesthesia Type: General Level of consciousness: awake and alert Pain management: pain level controlled Vital Signs Assessment: post-procedure vital signs reviewed and stable Respiratory status: spontaneous breathing, nonlabored ventilation and respiratory function stable Cardiovascular status: blood pressure returned to baseline and stable Postop Assessment: no signs of nausea or vomiting Anesthetic complications: no       Last Vitals:  Vitals:   06/10/16 1145 06/10/16 1205  BP: 128/75 (!) 146/76  Pulse: 62   Resp: 17   Temp: 36.6 C     Last Pain:  Vitals:   06/10/16 0643  TempSrc: Oral                 Tilda Samudio,W. EDMOND

## 2016-06-10 NOTE — Anesthesia Procedure Notes (Addendum)
Procedure Name: Intubation Date/Time: 06/10/2016 8:48 AM Performed by: Tressia Miners LEFFEW Pre-anesthesia Checklist: Patient identified, Patient being monitored, Timeout performed, Emergency Drugs available and Suction available Patient Re-evaluated:Patient Re-evaluated prior to inductionOxygen Delivery Method: Circle System Utilized Preoxygenation: Pre-oxygenation with 100% oxygen Intubation Type: IV induction Ventilation: Nasal airway inserted- appropriate to patient size and Two handed mask ventilation required Laryngoscope Size: Glidescope and 4 Grade View: Grade I Nasal Tubes: Nasal prep performed and Nasal Rae Tube size: 7.5 mm Number of attempts: 1 Placement Confirmation: ETT inserted through vocal cords under direct vision,  positive ETCO2 and breath sounds checked- equal and bilateral Tube secured with: Tape Dental Injury: Teeth and Oropharynx as per pre-operative assessment

## 2016-06-10 NOTE — Discharge Instructions (Signed)

## 2016-06-10 NOTE — Progress Notes (Signed)
PRE-OPERATIVE NOTE:  06/10/2016 Lonnie Hansen [id number]  VITALS: BP (!) 137/106   Pulse (!) 51   Temp 98.3 F (36.8 C) (Oral)   Resp 20   SpO2 100%   Lab Results  Component Value Date   WBC 6.8 06/04/2016   HGB 12.8 (L) 06/04/2016   HCT 38.5 (L) 06/04/2016   MCV 91.4 06/04/2016   PLT 101 (L) 06/04/2016   BMET    Component Value Date/Time   NA 138 06/04/2016 1439   K 3.8 06/04/2016 1439   CL 105 06/04/2016 1439   CO2 24 06/04/2016 1439   GLUCOSE 100 (H) 06/04/2016 1439   BUN 13 06/04/2016 1439   CREATININE 0.85 06/04/2016 1439   CREATININE 0.75 04/10/2016 1132   CALCIUM 9.1 06/04/2016 1439   GFRNONAA >60 06/04/2016 1439   GFRAA >60 06/04/2016 1439    Lab Results  Component Value Date   INR 1.0 04/10/2016   No results found for: PTT   Lonnie Hansen presents for Multiple extractions with alveoloplasty, pre-prosthetic surgery as needed, and gross debridement of remaining dentition in the operating room with general anesthesia.   SUBJECTIVE: The patient denies any acute medical or dental changes and agrees to proceed with treatment as planned.  EXAM: No sign of acute dental changes.  ASSESSMENT: Patient is affected by chronic apical periodontitis, dental caries, retained root segments, chronic periodontitis, and accretions.   PLAN: Patient agrees to proceed with treatment as planned in the operating room as previously discussed and accepts the risks, benefits, and complications of the proposed treatment. Patient is aware of the risk for bleeding, bruising, swelling, infection, pain, nerve damage, soft tissue damage, damage to adjacent teeth, sinus involvement, root tip fracture, mandible fracture, and the risks of complications associated with the anesthesia. Patient also is aware of the potential for other complications up to and including death due to his overall cardiovascular compromise.   Lenn Cal, DDS

## 2016-06-11 ENCOUNTER — Encounter (HOSPITAL_COMMUNITY): Payer: Self-pay | Admitting: Dentistry

## 2016-06-23 ENCOUNTER — Encounter (HOSPITAL_COMMUNITY): Payer: Self-pay | Admitting: Dentistry

## 2016-06-23 ENCOUNTER — Ambulatory Visit (HOSPITAL_COMMUNITY): Payer: Medicaid - Dental | Admitting: Dentistry

## 2016-06-23 VITALS — BP 132/65 | HR 58 | Temp 98.3°F

## 2016-06-23 DIAGNOSIS — K08199 Complete loss of teeth due to other specified cause, unspecified class: Secondary | ICD-10-CM

## 2016-06-23 DIAGNOSIS — Z01818 Encounter for other preprocedural examination: Secondary | ICD-10-CM

## 2016-06-23 DIAGNOSIS — K08409 Partial loss of teeth, unspecified cause, unspecified class: Secondary | ICD-10-CM

## 2016-06-23 DIAGNOSIS — M264 Malocclusion, unspecified: Secondary | ICD-10-CM

## 2016-06-23 DIAGNOSIS — I351 Nonrheumatic aortic (valve) insufficiency: Secondary | ICD-10-CM

## 2016-06-23 NOTE — Progress Notes (Signed)
POST OPERATIVE NOTE:  06/23/2016 Lonnie Hansen [id number]  VITALS: BP 132/65 (BP Location: Left Arm)   Pulse (!) 58   Temp 98.3 F (36.8 C) (Oral)   LABS:  Lab Results  Component Value Date   WBC 6.8 06/04/2016   HGB 12.8 (L) 06/04/2016   HCT 38.5 (L) 06/04/2016   MCV 91.4 06/04/2016   PLT 101 (L) 06/04/2016   BMET    Component Value Date/Time   NA 138 06/04/2016 1439   K 3.8 06/04/2016 1439   CL 105 06/04/2016 1439   CO2 24 06/04/2016 1439   GLUCOSE 100 (H) 06/04/2016 1439   BUN 13 06/04/2016 1439   CREATININE 0.85 06/04/2016 1439   CREATININE 0.75 04/10/2016 1132   CALCIUM 9.1 06/04/2016 1439   GFRNONAA >60 06/04/2016 1439   GFRAA >60 06/04/2016 1439    Lab Results  Component Value Date   INR 1.0 04/10/2016   No results found for: PTT   Lonnie Hansen is status post multiple extractions with alveoloplasty and gross debridement of remaining dentition in the operating room with general anesthesia.   SUBJECTIVE: Patient with minimal discomfort at this time. Patient denies having any sutures that remain.    EXAM: There is no sign of infection, heme, or ooze. Patient is healing in by generalized primary closure. No sutures remain.  PROCEDURE: The patient was given a chlorhexidine gluconate rinse for 30 seconds. No sutures were removed at this time.   ASSESSMENT: Post operative course is consistent with dental procedures performed in the operating room. Loss of teeth due to extraction Multiple missing teeth Malocclusion   PLAN: 1. Continue salt water rinses as needed to aid healing. 2. Brush teeth after meals and at bedtime. Floss at bedtime. 3. Advance diet as tolerated. 4. Follow-up with a dentist of his choice for continued dental care including evaluation for replacement of missing teeth as indicated.  Patient will need antibiotic premedication prior to invasive dental procedures for American Heart Association guidelines after the heart valve  surgery. 5. Patient is currently cleared dentally for heart valve surgery at this time.   Lenn Cal, DDS

## 2016-06-23 NOTE — Patient Instructions (Signed)
PLAN: 1. Continue salt water rinses as needed to aid healing. 2. Brush teeth after meals and at bedtime. Floss at bedtime. 3. Advance diet as tolerated. 4. Follow-up with a dentist of his choice for continued dental care including evaluation for replacement of missing teeth as indicated.  Patient will need antibiotic premedication prior to invasive dental procedures for American Heart Association guidelines after the heart valve surgery. 5. Patient is currently cleared dentally for heart valve surgery at this time.   Lenn Cal, DDS

## 2016-07-09 ENCOUNTER — Ambulatory Visit: Payer: Medicare Other | Admitting: Cardiothoracic Surgery

## 2016-07-30 ENCOUNTER — Encounter: Payer: Self-pay | Admitting: Cardiothoracic Surgery

## 2016-07-30 ENCOUNTER — Ambulatory Visit (INDEPENDENT_AMBULATORY_CARE_PROVIDER_SITE_OTHER): Payer: Medicare Other | Admitting: Cardiothoracic Surgery

## 2016-07-30 VITALS — BP 150/80 | HR 60 | Resp 20 | Ht 69.0 in | Wt 201.0 lb

## 2016-07-30 DIAGNOSIS — I77810 Thoracic aortic ectasia: Secondary | ICD-10-CM

## 2016-07-30 DIAGNOSIS — I2510 Atherosclerotic heart disease of native coronary artery without angina pectoris: Secondary | ICD-10-CM | POA: Diagnosis not present

## 2016-07-30 NOTE — Progress Notes (Signed)
[address]       [place] [address]             [phone]                    [facility] Medical Record #[id number] Date of Birth: 23-Apr-1950  Referring:PATERSON,DANIEL G, MD Primary Care: [name], MD  Chief Complaint:    Chief Complaint  Patient presents with  . Thoracic Aortic Dissection    further discuss sugery after dental clearance    History of Present Illness:    Patient returns to the office after recent dental extraction. He notes over the past couple days he developed a boil on the right side of the scalp, and just today started taking by mouth doxycycline prescribed by Dr. Sharlett Iles. He is to see the dermatologist tomorrow.  The patient was originally was having hip pain making it difficult for him to be active. He was seeing Dr. Ninfa Linden for consideration of right hip replacement. In preparation for this a chest x-ray was performed leading to a CT scan of the chest. The patient has a previous CT scan of the chest done during admission in 2012 for syncope.  Currently he denies any cardiac symptoms denies chest pain exertional resting shortness of breath though he does note his activity is limited due to his right hip pain other than episode of syncope in 2012 he's had no repeat syncopal episodes, notes rare palpitations. He has no previous history of myocardial infarction.   His family history is significant for his father who had had a MI prior but "drop dead at home" at age 23 in 46. His mother died of lymphoma date 62 1 brother and 1 sister alive his brothers had a myocardial infarction in the past., He has no children. There is no other history can on [place] calls her cousins of unexplained sudden death at a young age.   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     He notes his hip is continually gotten worse to the point he  now uses a cane to ambulate and would like to get it replaced.   Current Activity/ Functional Status:  Patient is independent with mobility/ambulation, transfers, ADL's, IADL's.  But does use a cane because of pain in the right hip  Zubrod Score: At the time of surgery this patient's most appropriate activity status/level should be described as: []     0    Normal activity, no symptoms [x]     1    Restricted in physical strenuous activity but ambulatory, able to do out light work []     2    Ambulatory and capable of self care, unable to do work activities, up and about               >50 % of waking hours                              []     3    Only limited self care, in bed greater than 50% of waking hours []     4    Completely disabled, no self care, confined to bed or chair []     5    Moribund   Past Medical History:  Diagnosis Date  .  Arthritis   . Asymptomatic bilateral carotid artery stenosis 08/2015   1-39%   . Cataracts, bilateral   . Diverticulosis   . Enlarged aorta ([place])   . Enlarged prostate    slightly  . GERD (gastroesophageal reflux disease)    takes Pantoprazole daily as needed  . Glaucoma    uses eye drops daily  . Headache   . History of colon polyps    benign  . History of kidney stones   . Hyperlipidemia    no on any meds  . Hypertension    takes Amlodipine and Atenolol daily  . Joint pain   . Joint swelling   . Nocturia     Past Surgical History:  Procedure Laterality Date  . AORTIC ARCH ANGIOGRAPHY N/A 04/16/2016   Procedure: Aortic Arch Angiography;  Surgeon: [name] Martinique, MD;  Location: [facility];  Service: Cardiovascular;  Laterality: N/A;  . COLONOSCOPY    . COLONOSCOPY WITH ESOPHAGOGASTRODUODENOSCOPY (EGD)    . MULTIPLE EXTRACTIONS WITH ALVEOLOPLASTY N/A 06/10/2016   Procedure: Extraction of tooth #'s 2,8,13,15, and 29  with alveoloplasty, maxillary right and left buccal exostoses reductions, and gross debridement of remaining  teeth.;  Surgeon: Lenn Cal, DDS;  Location: [facility];  Service: Oral Surgery;  Laterality: N/A;  . RIGHT/LEFT HEART CATH AND CORONARY ANGIOGRAPHY N/A 04/16/2016   Procedure: Right/Left Heart Cath and Coronary Angiography;  Surgeon: [name] Martinique, MD;  Location: [facility];  Service: Cardiovascular;  Laterality: N/A;    Family History  Problem Relation Age of Onset  . Heart disease Father   . Heart attack Father   . Thyroid disease Sister   As noted above his father died suddenly at age 46, he had a previous history of myocardial infarction   Social History   Social History  . Marital Status: Married    Spouse Name: N/A  . Number of Children: N/A  . Years of Education: N/A   Occupational History  . Not on file.   Social History Main Topics  . Smoking status: Former Smoker -- 1.00 packs/day for 25 years    Types: Cigarettes  . Smokeless tobacco: Not on file  . Alcohol Use: No  . Drug Use: No  . Sexual Activity: Not on file   Other Topics Concern  . Not on file   Social History Narrative   Married.   He is a Optometrist.       History  Smoking Status  . Former Smoker  . Packs/day: 1.00  . Years: 25.00  . Types: Cigarettes  Smokeless Tobacco  . Never Used    History  Alcohol Use No     No Known Allergies  Current Outpatient Prescriptions  Medication Sig Dispense Refill  . acetaminophen (TYLENOL) 500 MG tablet Take 500-1,000 mg by mouth every 6 (six) hours as needed for mild pain (depends on pain level if takes 1-2 tablets).    Marland Kitchen amLODipine (NORVASC) 10 MG tablet Take 10 mg by mouth daily.    Marland Kitchen atenolol (TENORMIN) 25 MG tablet Take 12.5 mg by mouth daily.     . bimatoprost (LUMIGAN) 0.01 % SOLN Place 1 drop into both eyes at bedtime.    Marland Kitchen doxycycline (MONODOX) 100 MG capsule Take 100 mg by mouth 2 (two) times daily.    . Multiple Vitamin (MULTIVITAMIN WITH MINERALS) TABS tablet Take 1 tablet by mouth daily.    Marland Kitchen oxyCODONE-acetaminophen  (PERCOCET) 5-325 MG tablet Take  one or two tablets by mouth every 6 hours as needed for pain. 32 tablet 0  . pantoprazole (PROTONIX) 40 MG tablet Take 40 mg by mouth daily.     No current facility-administered medications for this visit.      Review of Systems:     Cardiac Review of Systems: Y or N  Chest Pain [  n  ]  Resting SOB [  n ] Exertional SOB  [n  ]  Orthopnea [ n ]   Pedal Edema [   ]    Palpitations [  ] Syncope  [ once 2012 ]   Presyncope [ n  ]  General Review of Systems: [Y] = yes [  ]=no Constitional: recent weight change [ gain ];  Wt loss over the last 3 months [   ] anorexia [  ]; fatigue [ y ]; nausea [ n ]; night sweats [ n ]; fever [n]; or chills [  n];          Dental: poor dentition[  n]; Last Dentist visit:   Eye : blurred vision [  ]; diplopia [   ]; vision changes [  ];  Amaurosis fugax[  ]; Resp: cough [n  ];  wheezing[ n ];  hemoptysis[ n ]; shortness of breath[  ]; paroxysmal nocturnal dyspnea[  ]; dyspnea on exertion[  ]; or orthopnea[  ];  GI:  gallstones[  ], vomiting[  ];  dysphagia[  ]; melena[  ];  hematochezia [  ]; heartburn[  ];   Hx of  Colonoscopy[ y last year ]; GU: kidney stones [  ]; hematuria[  ];   dysuria [  ];  nocturia[  ];  history of     obstruction [  ]; urinary frequency [  ]             Skin: rash, swelling[  ];, hair loss[  ];  peripheral edema[  ];  or itching[  ]; Musculosketetal: myalgias[  ];  joint swelling[  ];  joint erythema[  ];  joint pain[  ];  back pain[  ];  Heme/Lymph: bruising[  ];  bleeding[  ];  anemia[  ];  Neuro: TIA[  ];  headaches[  ];  stroke[  ];  vertigo[  ];  seizures[ n ];   paresthesias[  ];  difficulty walking[ y ];  Psych:depression[  ]; anxiety[  ];  Endocrine: diabetes[ n ];  thyroid dysfunction[n ];  Immunizations: Flu up to date [ y]; Pneumococcal up to date [ n ];  Other:  Physical Exam: BP (!) 150/80   Pulse 60   Resp 20   Ht 5\' 9"  (1.753 m)   Wt 201 lb (91.2 kg)   SpO2 97% Comment: RA  BMI  29.68 kg/m   PHYSICAL EXAMINATION: Physical Exam  Constitutional: He is oriented to person, place, and time. He appears well-developed and well-nourished. No distress.  HENT:  Mouth/Throat: No oropharyngeal exudate.  Eyes: Left eye exhibits no discharge.  Neck: No tracheal deviation present. No thyromegaly present.  Cardiovascular: Regular rhythm.  Exam reveals no gallop and no friction rub.   Murmur heard. Respiratory: No stridor. No respiratory distress. He has no wheezes. He has no rales. He exhibits no tenderness.  GI: He exhibits no distension and no mass. There is no tenderness. There is no rebound and no guarding.  Musculoskeletal: He exhibits no edema, tenderness or deformity.  Lymphadenopathy:    He has no cervical adenopathy.  Neurological: He is alert and oriented to person, place, and time.  Skin: Skin is warm and dry. He is not diaphoretic. There is erythema.  Psychiatric: He has a normal mood and affect. His behavior is normal. Judgment and thought content normal.  Murmur of aortic insufficiency is noted Patient has a pustule with surrounding erythema on the right posterior scalp Has palpable DP and PT pulses bilaterally  Diagnostic Studies & Laboratory data:     Recent Radiology Findings:    Ct Angio Chest Aorta W/cm &/or Wo/cm  Addendum Date: 03/29/2016   ADDENDUM REPORT: 03/29/2016 14:08 ADDENDUM: The aortic root at the level of the sinuses of Valsalva was remeasured at approximately 4.9- 5.2 cm in greatest diameter depending on axis of measurement. This appears stable compared to the prior coronary CTA on 08/28/2015. Electronically Signed   By: Aletta Edouard M.D.   On: 03/29/2016 14:08    Ct Angio Chest Aorta W/cm &/or Wo/cm  Result Date: 03/26/2016 CLINICAL DATA:  Thoracic aortic aneurysm without rupture. EXAM: CT ANGIOGRAPHY CHEST WITH CONTRAST TECHNIQUE: Multidetector CT imaging of the chest was performed using the standard protocol during bolus administration  of intravenous contrast. Multiplanar CT image reconstructions and MIPs were obtained to evaluate the vascular anatomy. CONTRAST:  75 mL of Isovue 370 intravenously. COMPARISON:  CT scan of July 24, 2015. FINDINGS: Cardiovascular: There is no evidence of thoracic aortic dissection. Great vessels are widely patent without significant stenosis. Aneurysmal aortic root is noted measured at 5.6 cm. Proximal ascending thoracic aorta measures 4.1 cm in diameter. Transverse aortic arch measures 4.0 cm. Descending thoracic aorta has maximum measured diameter of 3.9 cm. Coronary artery calcifications are noted. Mediastinum/Nodes: No enlarged mediastinal, hilar, or axillary lymph nodes. Thyroid gland, trachea, and esophagus demonstrate no significant findings. Lungs/Pleura: Lungs are clear. No pleural effusion or pneumothorax. Upper Abdomen: Large gallstone is noted. Stable partially calcified left renal cyst is noted. Musculoskeletal: No chest wall abnormality. No acute or significant osseous findings. Review of the MIP images confirms the above findings. IMPRESSION: Stable aneurysmal dilatation of aortic root is noted at 5.6 cm. 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 Recommend annual imaging followup by CTA or MRA. This recommendation follows 2010 ACCF/AHA/AATS/ACR/ASA/SCA/SCAI/SIR/STS/SVM Guidelines for the Diagnosis and Management of Patients with Thoracic Aortic Disease. Circulation. 2010; 121: H852-D782. Coronary artery calcifications are noted suggesting coronary disease. Large gallstone is noted. Electronically Signed   By: Marijo Conception, M.D.   On: 03/26/2016 15:37   I have independently reviewed the above radiology studies  and reviewed the findings with the patient. I've also discussed the CT findings with Dr. Nyoka Cowden , as the measurements I get both on axial coronal and sagittal cuts measures the aortic root at approximate 5.2    Ct Coronary Morph W/cta Cor W/score W/ca W/cm &/or  Wo/cm  Addendum Date: 08/28/2015   ADDENDUM REPORT: 08/28/2015 13:09 CLINICAL DATA:  Aortic Aneurysm EXAM: Cardiac  CT TECHNIQUE: The patient was scanned on a Philips 256 scanner. A 120 kV retrospective scan was triggered in the descending thoracic aorta at 111 HU's. Gantry rotation speed was 270 msecs and collimation was .9 mm. No beta blockade or nitro were given. The 3D data set was reconstructed in 5% intervals of the R-R cycle. Systolic and diastolic phases were analyzed on a dedicated work station using MPR, MIP and VRT modes. The patient received 80 cc of contrast. FINDINGS: Aortic Valve:  Trileaflet without significant calcium Aorta: Fusiform dilatation of the ascending  aorta widest at the sinus level. Bovine Arch with no coarctation. Isolated area of calcification at the isthmus with mild aneurysmal dilatation in this area as well Orthogonal double oblique measurements as follows Sinotubular Junction:  40 mm Ascending Thoracic Aorta:  42 mm Aortic Arch:  40 mm Isthmus:  40 mm Descending Thoracic Aorta:  27 mm Sinus of Valsalva Measurements: Non-coronary:  45 mm Right coronary: 46 mm Left coronary: 48 mm : 1) Trileaflet aortic valve with no significant calcification 2) Fusiform dilatation of the aortic sinuses maximum measurement in coronal view 51 mm. However anatomic short axis Views show largest diameter of 48 mm for left coronary sinus 3) Tortuous left subclavian vein 4) Bovine Arch 5) Mild dilatation of the aortic root, arch and isthmus 6) Normal descending thoracic aorta with no coarctation Jenkins Rouge Electronically Signed   By: Jenkins Rouge M.D.   On: 08/28/2015 13:09   Result Date: 08/28/2015 EXAM: OVER-READ INTERPRETATION  CT CHEST The following report is an over-read performed by radiologist Dr. Rebekah Chesterfield [facility] on 08/28/2015. This over-read does not include interpretation of cardiac or coronary anatomy or pathology. The coronary calcium score/coronary CTA  interpretation by the cardiologist is attached. COMPARISON:  Chest CT 07/24/2015. FINDINGS: Aortic atherosclerosis. Mild aneurysmal dilatation of the ascending thoracic aorta and the arch, both of which measure 4.5 cm in diameter. The isthmus is also aneurysmal measuring 4.3 cm in diameter. The remainder the descending thoracic aorta is otherwise normal in caliber. Aortic root dilatation (to be described separately). No evidence of thoracic aortic dissection. Within the visualized portions of the thorax there are no suspicious appearing pulmonary nodules or masses, there is no acute consolidative airspace disease, no pleural effusions, no pneumothorax and no lymphadenopathy. Visualized portions of the upper abdomen are unremarkable. There are no aggressive appearing lytic or blastic lesions noted in the visualized portions of the skeleton. IMPRESSION: 1. No acute incidental noncardiac findings are noted. 2. Aneurysmal dilatation of the ascending thoracic aorta, aortic arch and isthmus, as above. No thoracic aortic dissection at this time. Recommend semi-annual imaging followup by CTA or MRA. This recommendation follows 2010 ACCF/AHA/AATS/ACR/ASA/SCA/SCAI/SIR/STS/SVM Guidelines for the Diagnosis and Management of Patients With Thoracic Aortic Disease. Circulation. 2010; 121: N235-T732. Electronically Signed: By: Vinnie Langton M.D. On: 08/28/2015 10:59    Ct Chest W Contrast  07/24/2015  CLINICAL DATA:  Ectatic thoracic aorta on recent chest radiograph EXAM: CT CHEST WITH CONTRAST TECHNIQUE: Multidetector CT imaging of the chest was performed during intravenous contrast administration. CONTRAST:  34mL ISOVUE-300 IOPAMIDOL (ISOVUE-300) INJECTION 61% COMPARISON:  Most recent available chest CT April 27, 2010 FINDINGS: Mediastinum/Lymph Nodes: There is dilatation of the ascending thoracic aorta. Just beyond the aortic valve, the ascending thoracic aorta measures 6.1 x 4.9 cm, stable from prior study. At the level of  the proximal right main pulmonary artery, the thoracic aorta has a maximum transverse diameter of 4.6 x 4.1 cm, essentially stable from prior study. At the level of the aortic arch, the transverse diameter is 4.6 cm, stable. There is no thoracic aortic dissection. Visualized great vessels appear somewhat tortuous but otherwise unremarkable. There is no major vessel pulmonary embolus. There are multiple foci of coronary artery calcification. There is no appreciable pericardial thickening. Thyroid appears unremarkable. There is no appreciable thoracic adenopathy. Lungs/Pleura: There is slight scarring in the lung bases. There is no parenchymal lung edema or consolidation. Upper abdomen: In the visualized upper abdomen, there is cholelithiasis. Gallbladder wall does not appear appreciably  thickened. There is a complex cyst in the upper left kidney containing calcification, unchanged compared to prior study. There is a parapelvic cyst on the right, incompletely visualized, measuring 4.2 x 3.5 cm. Musculoskeletal: There are no blastic or lytic bone lesions. IMPRESSION: Thoracic aortic prominence as noted above. Prominence is greatest just beyond the aortic valve. Question a degree of aortic valve disease. Recommend semi-annual imaging followup by CTA or MRA and referral to cardiothoracic surgery if not already obtained. This recommendation follows 2010 ACCF/AHA/AATS/ACR/ASA/SCA/SCAI/SIR/STS/SVM Guidelines for the Diagnosis and Management of Patients With Thoracic Aortic Disease. Circulation. 2010; 121: [id number]. Note that this aortic prominence is essentially stable compared to 2012 study. There is no periaortic fluid. No thoracic aortic dissection noted. No pericardial effusion. There are multiple areas of coronary artery calcification. No parenchymal lung edema or consolidation. No parenchymal lung mass or adenopathy. Cholelithiasis. Stable cystic lesion containing multiple calcifications in the upper pole left  kidney. Parapelvic cyst right kidney. Electronically Signed   By: Lowella Grip III M.D.   On: 07/24/2015 11:00    I have independently reviewed the above radiology studies  and reviewed the findings with the patient.  ECHO: Echocardiography  Patient:    Khristian, Phillippi MR #:       [id number] Study Date: 09/20/2015 Gender:     M Age:        41 Height:     175.3 cm Weight:     91.6 kg BSA:        2.14 m^2 Pt. Status: Room:   [place] MD  [place] MD  REFERRING    [name] [id number]  ATTENDING    Candee Furbish, M.D.  SONOGRAPHER  Cindy Hazy, RDCS  PERFORMING   [facility], Outpatient  cc:  ------------------------------------------------------------------- LV EF: 60% -   65%  ------------------------------------------------------------------- Indications:      I35.1 Aortic Insufficiency.  I71.2 Ascending Aortic Aneurysm.  ------------------------------------------------------------------- History:   PMH:  Acquired from the patient and from the patient&'s chart.  PMH:  Dilated Thoracic Aorta.  Risk factors:  Hypertension. Dyslipidemia.  ------------------------------------------------------------------- Study Conclusions  - Left ventricle: The cavity size was normal. There was moderate   focal basal hypertrophy of the septum. Systolic function was   normal. The estimated ejection fraction was in the range of 60%   to 65%. Wall motion was normal; there were no regional wall   motion abnormalities. Doppler parameters are consistent with   abnormal left ventricular relaxation (grade 1 diastolic   dysfunction). - Aortic valve: There was moderate to severe regurgitation. - Aorta: Aortic root dimension: 43 mm (ED). - Ascending aorta: The ascending aorta was mildly dilated. - Right ventricle: The cavity size was mildly dilated. Wall   thickness was  normal.  ------------------------------------------------------------------- Study data:   Study status:  Routine.  Procedure:  The patient reported no pain pre or post test. Transthoracic echocardiography for left ventricular function evaluation, for right ventricular function evaluation, and for assessment of valvular function. Image quality was adequate.  Study completion:  There were no complications.          Echocardiography.  M-mode, complete 2D, spectral Doppler, and color Doppler.  Birthdate:  Patient birthdate: Jul 15, 1950.  Age:  Patient is 66 yr old.  Sex:  Gender: male.    BMI: 29.8 kg/m^2.  Blood pressure:     136/74  Patient status:  Outpatient.  Study date:  Study date: 09/20/2015. Study time: 10:58  AM.  Location:  [facility]  -------------------------------------------------------------------  ------------------------------------------------------------------- Left ventricle:  The cavity size was normal. There was moderate focal basal hypertrophy of the septum. Systolic function was normal. The estimated ejection fraction was in the range of 60% to 65%. Wall motion was normal; there were no regional wall motion abnormalities. Doppler parameters are consistent with abnormal left ventricular relaxation (grade 1 diastolic dysfunction).  ------------------------------------------------------------------- Aortic valve:   Trileaflet; normal thickness leaflets. Mobility was not restricted.  Doppler:  Transvalvular velocity was within the normal range. There was no stenosis. There was moderate to severe regurgitation.  ------------------------------------------------------------------- Aorta:  Ascending aorta: The ascending aorta was mildly dilated.  ------------------------------------------------------------------- Mitral valve:   Structurally normal valve.   Mobility was not restricted.  Doppler:  Transvalvular velocity was within the normal range. There  was no evidence for stenosis. There was no regurgitation.  ------------------------------------------------------------------- Left atrium:  The atrium was normal in size.  ------------------------------------------------------------------- Right ventricle:  The cavity size was mildly dilated. Wall thickness was normal. Systolic function was normal.  ------------------------------------------------------------------- Pulmonic valve:    Doppler:  Transvalvular velocity was within the normal range. There was no evidence for stenosis.  ------------------------------------------------------------------- Tricuspid valve:   Structurally normal valve.    Doppler: Transvalvular velocity was within the normal range. There was trivial regurgitation.  ------------------------------------------------------------------- Pulmonary artery:   The main pulmonary artery was normal-sized. Systolic pressure was within the normal range.  ------------------------------------------------------------------- Right atrium:  The atrium was normal in size.  ------------------------------------------------------------------- Pericardium:  There was no pericardial effusion.  ------------------------------------------------------------------- Systemic veins: Inferior vena cava: The vessel was normal in size.  ------------------------------------------------------------------- Measurements   Left ventricle                           Value        Reference  LV ID, ED, PLAX chordal                  50.9  mm     43 - 52  LV ID, ES, PLAX chordal                  34.6  mm     23 - 38  LV fx shortening, PLAX chordal           32    %      >=29  LV PW thickness, ED                      10.9  mm     ---------  IVS/LV PW ratio, ED              (H)     1.39         <=1.3  Stroke volume, 2D                        124   ml     ---------  Stroke volume/bsa, 2D                    58    ml/m^2 ---------  LV  e&', lateral                           5.26  cm/s   ---------  LV E/e&', lateral  7.7          ---------  LV e&', medial                            5.55  cm/s   ---------  LV E/e&', medial                          7.3          ---------  LV e&', average                           5.41  cm/s   ---------  LV E/e&', average                         7.49         ---------    Ventricular septum                       Value        Reference  IVS thickness, ED                        15.2  mm     ---------    LVOT                                     Value        Reference  LVOT ID, S                               30    mm     ---------  LVOT area                                7.07  cm^2   ---------  LVOT ID                                  30    mm     ---------  LVOT peak velocity, S                    76.7  cm/s   ---------  LVOT mean velocity, S                    53.7  cm/s   ---------  LVOT VTI, S                              17.5  cm     ---------  LVOT peak gradient, S                    2     mm Hg  ---------  Stroke volume (SV), LVOT DP              123.7 ml     ---------  Stroke index (SV/bsa), LVOT DP           57.9  ml/m^2 ---------    Aortic valve  Value        Reference  Aortic regurg pressure half-time         916   ms     ---------    Aorta                                    Value        Reference  Aortic root ID, ED                       43    mm     ---------    Left atrium                              Value        Reference  LA ID, A-P, ES                           33    mm     ---------  LA ID/bsa, A-P                           1.54  cm/m^2 <=2.2  LA volume, S                             37    ml     ---------  LA volume/bsa, S                         17.3  ml/m^2 ---------  LA volume, ES, 1-p A4C                   31    ml     ---------  LA volume/bsa, ES, 1-p A4C               14.5  ml/m^2 ---------  LA volume, ES, 1-p  A2C                   44    ml     ---------  LA volume/bsa, ES, 1-p A2C               20.6  ml/m^2 ---------    Mitral valve                             Value        Reference  Mitral E-wave peak velocity              40.5  cm/s   ---------  Mitral A-wave peak velocity              57.8  cm/s   ---------  Mitral deceleration time         (H)     257   ms     150 - 230  Mitral E/A ratio, peak                   0.7          ---------    Tricuspid valve  Value        Reference  Tricuspid regurg peak velocity           251   cm/s   ---------  Tricuspid peak RV-RA gradient            25    mm Hg  ---------    Right ventricle                          Value        Reference  RV s&', lateral, S                        18    cm/s   ---------  Legend: (L)  and  (H)  mark values outside specified reference range.  ------------------------------------------------------------------- Prepared and Electronically Authenticated by  Candee Furbish, M.D. 2017-08-25T13:27:13  There is  moderate to severe regurgitation On echo  EF 60- 65%   LV ID, ED, PLAX chordal                  50.9  mm     43 - 52  LV ID, ES, PLAX chordal                  34.6  mm     23 - 38    Cath: Procedures   Aortic Arch Angiography  Right/Left Heart Cath and Coronary Angiography  Conclusion     Mid RCA lesion, 80 %stenosed.  Ost LAD to Prox LAD lesion, 25 %stenosed.  Dist LAD lesion, 50 %stenosed.  There is mild left ventricular systolic dysfunction.  LV end diastolic pressure is normal.  The left ventricular ejection fraction is 45-50% by visual estimate.  There is no mitral valve regurgitation.  There is no aortic valve stenosis.  LV end diastolic pressure is normal.   1. 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 Mild LV dysfunction. EF 45%. 3. Ascending thoracic aortic aneurysm. 4. 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 5. Normal Right heart  And LV filling  pressures 6. Normal cardiac output.   Plan: Aortic root grafting and AVR.      I have independently reviewed the above  cath films and reviewed the findings with the  patient .   Recent Lab Findings:    Aortic Size Index=      5.2  /Body surface area is 2.11 meters squared. 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  Patient was referred for dental clearance, - patient had multiple dental extractions performed and is now healing. Before she now has a significant infection in the right posterior scalp. He started on antibiotics for this we'll see a dermatologist. I'll see him back in the office in 2 weeks before scheduling surgery to ensure that there is no active infection before replaced his ascending aorta and aortic valve and coronary artery bypass grafting.       Grace Isaac MD      [employer].[address] [place] [address] Office [phone]   Beeper [phone]  07/30/2016 5:11 PM

## 2016-08-03 DIAGNOSIS — L723 Sebaceous cyst: Secondary | ICD-10-CM | POA: Diagnosis not present

## 2016-08-03 DIAGNOSIS — M25551 Pain in right hip: Secondary | ICD-10-CM | POA: Diagnosis not present

## 2016-08-03 DIAGNOSIS — Z6831 Body mass index (BMI) 31.0-31.9, adult: Secondary | ICD-10-CM | POA: Diagnosis not present

## 2016-08-13 ENCOUNTER — Ambulatory Visit (INDEPENDENT_AMBULATORY_CARE_PROVIDER_SITE_OTHER): Payer: Medicare Other | Admitting: Cardiothoracic Surgery

## 2016-08-13 ENCOUNTER — Encounter: Payer: Self-pay | Admitting: Cardiothoracic Surgery

## 2016-08-13 VITALS — BP 136/77 | HR 55 | Resp 16 | Ht 69.0 in | Wt 200.1 lb

## 2016-08-13 DIAGNOSIS — I77810 Thoracic aortic ectasia: Secondary | ICD-10-CM

## 2016-08-13 DIAGNOSIS — I351 Nonrheumatic aortic (valve) insufficiency: Secondary | ICD-10-CM

## 2016-08-13 DIAGNOSIS — I2510 Atherosclerotic heart disease of native coronary artery without angina pectoris: Secondary | ICD-10-CM | POA: Diagnosis not present

## 2016-08-13 DIAGNOSIS — I712 Thoracic aortic aneurysm, without rupture, unspecified: Secondary | ICD-10-CM

## 2016-08-13 NOTE — Progress Notes (Signed)
[address]       [place] [address]             [phone]                    [facility] Medical Record #[id number] Date of Birth: 01-May-1950  Referring:PATERSON,DANIEL G, MD Primary Care: [name], MD  Chief Complaint:    Chief Complaint  Patient presents with  . Follow-up    2 wk to further discuss surgery    History of Present Illness:    Patient returns to the office To further discuss aortic valve replacement and ascending aorta. When last seen he had active pustules in his scalp that were being treated. He completed a course of by mouth antibiotics today and notes the areas are no longer tender. He has also completed  recent dental extraction.    The patient was originally was having hip pain making it difficult for him to be active. He was seeing Dr. Ninfa Linden for consideration of right hip replacement. In preparation for this a chest x-ray was performed leading to a CT scan of the chest. The patient has a previous CT scan of the chest done during admission in 2012 for syncope.  Currently he denies any cardiac symptoms denies chest pain exertional resting shortness of breath though he does note his activity is limited due to his right hip pain other than episode of syncope in 2012 he's had no repeat syncopal episodes, notes rare palpitations. He has no previous history of myocardial infarction.   His family history is significant for his father who had had a MI prior but "drop dead at home" at age 75 in 35. His mother died of lymphoma date 52 1 brother and 1 sister alive his brothers had a myocardial infarction in the past., He has no children. There is no other history can on [place] calls her cousins of unexplained sudden death at a young age.   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     He notes his hip is continually gotten worse  to the point he now uses a cane to ambulate and would like to get it replaced.   Current Activity/ Functional Status:  Patient is independent with mobility/ambulation, transfers, ADL's, IADL's.  But does use a cane because of pain in the right hip  Zubrod Score: At the time of surgery this patient's most appropriate activity status/level should be described as: []     0    Normal activity, no symptoms [x]     1    Restricted in physical strenuous activity but ambulatory, able to do out light work []     2    Ambulatory and capable of self care, unable to do work activities, up and about               >50 % of waking hours                              []     3    Only limited self care, in bed greater than 50% of waking hours []     4    Completely disabled, no self care, confined to bed or chair []     5    Moribund  Past Medical History:  Diagnosis Date  . Arthritis   . Asymptomatic bilateral carotid artery stenosis 08/2015   1-39%   . Cataracts, bilateral   . Diverticulosis   . Enlarged aorta ([place])   . Enlarged prostate    slightly  . GERD (gastroesophageal reflux disease)    takes Pantoprazole daily as needed  . Glaucoma    uses eye drops daily  . Headache   . History of colon polyps    benign  . History of kidney stones   . Hyperlipidemia    no on any meds  . Hypertension    takes Amlodipine and Atenolol daily  . Joint pain   . Joint swelling   . Nocturia     Past Surgical History:  Procedure Laterality Date  . AORTIC ARCH ANGIOGRAPHY N/A 04/16/2016   Procedure: Aortic Arch Angiography;  Surgeon: [name] Martinique, MD;  Location: [facility];  Service: Cardiovascular;  Laterality: N/A;  . COLONOSCOPY    . COLONOSCOPY WITH ESOPHAGOGASTRODUODENOSCOPY (EGD)    . MULTIPLE EXTRACTIONS WITH ALVEOLOPLASTY N/A 06/10/2016   Procedure: Extraction of tooth #'s 2,8,13,15, and 29  with alveoloplasty, maxillary right and left buccal exostoses reductions, and gross debridement of  remaining teeth.;  Surgeon: Lenn Cal, DDS;  Location: [facility];  Service: Oral Surgery;  Laterality: N/A;  . RIGHT/LEFT HEART CATH AND CORONARY ANGIOGRAPHY N/A 04/16/2016   Procedure: Right/Left Heart Cath and Coronary Angiography;  Surgeon: [name] Martinique, MD;  Location: [facility];  Service: Cardiovascular;  Laterality: N/A;    Family History  Problem Relation Age of Onset  . Heart disease Father   . Heart attack Father   . Thyroid disease Sister   As noted above his father died suddenly at age 39, he had a previous history of myocardial infarction   Social History   Social History  . Marital Status: Married    Spouse Name: N/A  . Number of Children: N/A  . Years of Education: N/A   Occupational History  . Not on file.   Social History Main Topics  . Smoking status: Former Smoker -- 1.00 packs/day for 25 years    Types: Cigarettes  . Smokeless tobacco: Not on file  . Alcohol Use: No  . Drug Use: No  . Sexual Activity: Not on file   Other Topics Concern  . Not on file   Social History Narrative   Married.   He is a Optometrist.       History  Smoking Status  . Former Smoker  . Packs/day: 1.00  . Years: 25.00  . Types: Cigarettes  Smokeless Tobacco  . Never Used    History  Alcohol Use No     No Known Allergies  Current Outpatient Prescriptions  Medication Sig Dispense Refill  . acetaminophen (TYLENOL) 500 MG tablet Take 500-1,000 mg by mouth every 6 (six) hours as needed for mild pain (depends on pain level if takes 1-2 tablets).    Marland Kitchen amLODipine (NORVASC) 10 MG tablet Take 10 mg by mouth daily.    Marland Kitchen atenolol (TENORMIN) 25 MG tablet Take 12.5 mg by mouth daily.     . bimatoprost (LUMIGAN) 0.01 % SOLN Place 1 drop into both eyes at bedtime.    . Multiple Vitamin (MULTIVITAMIN WITH MINERALS) TABS tablet Take 1 tablet by mouth daily.    Marland Kitchen oxyCODONE-acetaminophen (PERCOCET) 5-325 MG tablet Take one or two tablets by mouth every 6 hours as  needed for pain. 32 tablet 0  . pantoprazole (PROTONIX) 40 MG tablet Take 40 mg by mouth daily.     No current facility-administered medications for this visit.      Review of Systems:     Cardiac Review of Systems: Y or N  Chest Pain [  N ]  Resting SOB [ N Exertional SOB  [Y]  Orthopnea [ N ]   Pedal Edema Aqua.Slicker   ]    Palpitations Aqua.Slicker  ] Syncope  [ once 2012 ]   Presyncope Aqua.Slicker ]  General Review of Systems: [Y] = yes [  ]=no Constitional: recent weight change [ gain ];  Wt loss over the last 3 months [   ] anorexia [  ]; fatigue [ y ]; nausea [ n ]; night sweats [ n ]; fever [n]; or chills [  n];          Dental: poor dentition[  n]; Last Dentist visit: DENTAL EXTRACTION DONE  Eye : blurred vision [ N ]; diplopia [   ]; vision changes Aqua.Slicker  ];  Amaurosis fugax[N  ]; Resp: cough [n  ];  wheezing[ n ];  hemoptysis[ n ]; shortness of breath[  ]; paroxysmal nocturnal dyspnea[  ]; dyspnea on exertion[  ]; or orthopnea[  ];  GI:  gallstones[  ], vomiting[N ];  dysphagia[N  ]; melena[  ];  hematochezia [  ]; heartburn[ N ];   Hx of  Colonoscopy[ y last year ]; GU: kidney stones [  ]; hematuria[  ];   dysuria [  ];  nocturia[  ];  history of     obstruction [  ]; urinary frequency [  ]             Skin: rash, swelling[  ];, hair loss[  ];  peripheral edema[  ];  or itching[  ]; Musculosketetal: myalgias[  ];  joint swelling[  ];  joint erythema[  ];  joint pain[  ];  back pain[  ];  Heme/Lymph: bruising[  ];  bleeding[  ];  anemia[  ];  Neuro: TIA[ N ];  headaches[N  ];  stroke[ N ];  vertigo[ N ];  seizures[ n ];   paresthesias[  ];  difficulty walking[ y ];  Psych:depression[  ]; anxiety[  ];  Endocrine: diabetes[ n ];  thyroid dysfunction[n ];  Immunizations: Flu up to date [ y]; Pneumococcal up to date [ n ];  Other:  Physical Exam: BP 136/77 (BP Location: Right Arm, Patient Position: Sitting, Cuff Size: Large)   Pulse (!) 55   Resp 16   Ht 5\' 9"  (1.753 m)   Wt 200 lb 1.6 oz (90.8 kg)   SpO2  98% Comment: ON RA  BMI 29.55 kg/m   PHYSICAL EXAMINATION: Physical Exam  Constitutional: He is oriented to person, place, and time. He appears well-developed and well-nourished. No distress.  HENT:  Mouth/Throat: No oropharyngeal exudate.  Eyes: Left eye exhibits no discharge.  Neck: No tracheal deviation present. No thyromegaly present.  Cardiovascular: Regular rhythm.  Exam reveals no gallop and no friction rub.   Murmur heard. Respiratory: No stridor. No respiratory distress. He has no wheezes. He has no rales. He exhibits no tenderness.  GI: He exhibits no distension and no mass. There is no tenderness. There is no rebound and no guarding.  Musculoskeletal: He exhibits no edema, tenderness or deformity.  Lymphadenopathy:    He has no cervical adenopathy.  Neurological: He  is alert and oriented to person, place, and time.  Skin: Skin is warm and dry. He is not diaphoretic. There is erythema.  Psychiatric: He has a normal mood and affect. His behavior is normal. Judgment and thought content normal.  Murmur of aortic insufficiency is noted Patient has a pustule with surrounding erythema on the right posterior scalp Has palpable DP and PT pulses bilaterally  Diagnostic Studies & Laboratory data:     Recent Radiology Findings:    Ct Angio Chest Aorta W/cm &/or Wo/cm  Addendum Date: 03/29/2016   ADDENDUM REPORT: 03/29/2016 14:08 ADDENDUM: The aortic root at the level of the sinuses of Valsalva was remeasured at approximately 4.9- 5.2 cm in greatest diameter depending on axis of measurement. This appears stable compared to the prior coronary CTA on 08/28/2015. Electronically Signed   By: Aletta Edouard M.D.   On: 03/29/2016 14:08    Ct Angio Chest Aorta W/cm &/or Wo/cm  Result Date: 03/26/2016 CLINICAL DATA:  Thoracic aortic aneurysm without rupture. EXAM: CT ANGIOGRAPHY CHEST WITH CONTRAST TECHNIQUE: Multidetector CT imaging of the chest was performed using the standard protocol  during bolus administration of intravenous contrast. Multiplanar CT image reconstructions and MIPs were obtained to evaluate the vascular anatomy. CONTRAST:  75 mL of Isovue 370 intravenously. COMPARISON:  CT scan of July 24, 2015. FINDINGS: Cardiovascular: There is no evidence of thoracic aortic dissection. Great vessels are widely patent without significant stenosis. Aneurysmal aortic root is noted measured at 5.6 cm. Proximal ascending thoracic aorta measures 4.1 cm in diameter. Transverse aortic arch measures 4.0 cm. Descending thoracic aorta has maximum measured diameter of 3.9 cm. Coronary artery calcifications are noted. Mediastinum/Nodes: No enlarged mediastinal, hilar, or axillary lymph nodes. Thyroid gland, trachea, and esophagus demonstrate no significant findings. Lungs/Pleura: Lungs are clear. No pleural effusion or pneumothorax. Upper Abdomen: Large gallstone is noted. Stable partially calcified left renal cyst is noted. Musculoskeletal: No chest wall abnormality. No acute or significant osseous findings. Review of the MIP images confirms the above findings. IMPRESSION: Stable aneurysmal dilatation of aortic root is noted at 5.6 cm. 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 Recommend annual imaging followup by CTA or MRA. This recommendation follows 2010 ACCF/AHA/AATS/ACR/ASA/SCA/SCAI/SIR/STS/SVM Guidelines for the Diagnosis and Management of Patients with Thoracic Aortic Disease. Circulation. 2010; 121: I696-E952. Coronary artery calcifications are noted suggesting coronary disease. Large gallstone is noted. Electronically Signed   By: Marijo Conception, M.D.   On: 03/26/2016 15:37   I have independently reviewed the above radiology studies  and reviewed the findings with the patient. I've also discussed the CT findings with Dr. Nyoka Cowden , as the measurements I get both on axial coronal and sagittal cuts measures the aortic root at approximate 5.2    Ct Coronary Morph W/cta Cor  W/score W/ca W/cm &/or Wo/cm  Addendum Date: 08/28/2015   ADDENDUM REPORT: 08/28/2015 13:09 CLINICAL DATA:  Aortic Aneurysm EXAM: Cardiac  CT TECHNIQUE: The patient was scanned on a Philips 256 scanner. A 120 kV retrospective scan was triggered in the descending thoracic aorta at 111 HU's. Gantry rotation speed was 270 msecs and collimation was .9 mm. No beta blockade or nitro were given. The 3D data set was reconstructed in 5% intervals of the R-R cycle. Systolic and diastolic phases were analyzed on a dedicated work station using MPR, MIP and VRT modes. The patient received 80 cc of contrast. FINDINGS: Aortic Valve:  Trileaflet without significant calcium Aorta: Fusiform dilatation of the ascending aorta widest  at the sinus level. Bovine Arch with no coarctation. Isolated area of calcification at the isthmus with mild aneurysmal dilatation in this area as well Orthogonal double oblique measurements as follows Sinotubular Junction:  40 mm Ascending Thoracic Aorta:  42 mm Aortic Arch:  40 mm Isthmus:  40 mm Descending Thoracic Aorta:  27 mm Sinus of Valsalva Measurements: Non-coronary:  45 mm Right coronary: 46 mm Left coronary: 48 mm : 1) Trileaflet aortic valve with no significant calcification 2) Fusiform dilatation of the aortic sinuses maximum measurement in coronal view 51 mm. However anatomic short axis Views show largest diameter of 48 mm for left coronary sinus 3) Tortuous left subclavian vein 4) Bovine Arch 5) Mild dilatation of the aortic root, arch and isthmus 6) Normal descending thoracic aorta with no coarctation [place] Electronically Signed   By: Jenkins Rouge M.D.   On: 08/28/2015 13:09   Result Date: 08/28/2015 EXAM: OVER-READ INTERPRETATION  CT CHEST The following report is an over-read performed by radiologist Dr. Rebekah [place] on 08/28/2015. This over-read does not include interpretation of cardiac or coronary anatomy or pathology. The coronary calcium  score/coronary CTA interpretation by the cardiologist is attached. COMPARISON:  Chest CT 07/24/2015. FINDINGS: Aortic atherosclerosis. Mild aneurysmal dilatation of the ascending thoracic aorta and the arch, both of which measure 4.5 cm in diameter. The isthmus is also aneurysmal measuring 4.3 cm in diameter. The remainder the descending thoracic aorta is otherwise normal in caliber. Aortic root dilatation (to be described separately). No evidence of thoracic aortic dissection. Within the visualized portions of the thorax there are no suspicious appearing pulmonary nodules or masses, there is no acute consolidative airspace disease, no pleural effusions, no pneumothorax and no lymphadenopathy. Visualized portions of the upper abdomen are unremarkable. There are no aggressive appearing lytic or blastic lesions noted in the visualized portions of the skeleton. IMPRESSION: 1. No acute incidental noncardiac findings are noted. 2. Aneurysmal dilatation of the ascending thoracic aorta, aortic arch and isthmus, as above. No thoracic aortic dissection at this time. Recommend semi-annual imaging followup by CTA or MRA. This recommendation follows 2010 ACCF/AHA/AATS/ACR/ASA/SCA/SCAI/SIR/STS/SVM Guidelines for the Diagnosis and Management of Patients With Thoracic Aortic Disease. Circulation. 2010; 121: W098-J191. Electronically Signed: By: Vinnie Langton M.D. On: 08/28/2015 10:59    Ct Chest W Contrast  07/24/2015  CLINICAL DATA:  Ectatic thoracic aorta on recent chest radiograph EXAM: CT CHEST WITH CONTRAST TECHNIQUE: Multidetector CT imaging of the chest was performed during intravenous contrast administration. CONTRAST:  45mL ISOVUE-300 IOPAMIDOL (ISOVUE-300) INJECTION 61% COMPARISON:  Most recent available chest CT April 27, 2010 FINDINGS: Mediastinum/Lymph Nodes: There is dilatation of the ascending thoracic aorta. Just beyond the aortic valve, the ascending thoracic aorta measures 6.1 x 4.9 cm, stable from prior  study. At the level of the proximal right main pulmonary artery, the thoracic aorta has a maximum transverse diameter of 4.6 x 4.1 cm, essentially stable from prior study. At the level of the aortic arch, the transverse diameter is 4.6 cm, stable. There is no thoracic aortic dissection. Visualized great vessels appear somewhat tortuous but otherwise unremarkable. There is no major vessel pulmonary embolus. There are multiple foci of coronary artery calcification. There is no appreciable pericardial thickening. Thyroid appears unremarkable. There is no appreciable thoracic adenopathy. Lungs/Pleura: There is slight scarring in the lung bases. There is no parenchymal lung edema or consolidation. Upper abdomen: In the visualized upper abdomen, there is cholelithiasis. Gallbladder wall does not appear appreciably thickened. There  is a complex cyst in the upper left kidney containing calcification, unchanged compared to prior study. There is a parapelvic cyst on the right, incompletely visualized, measuring 4.2 x 3.5 cm. Musculoskeletal: There are no blastic or lytic bone lesions. IMPRESSION: Thoracic aortic prominence as noted above. Prominence is greatest just beyond the aortic valve. Question a degree of aortic valve disease. Recommend semi-annual imaging followup by CTA or MRA and referral to cardiothoracic surgery if not already obtained. This recommendation follows 2010 ACCF/AHA/AATS/ACR/ASA/SCA/SCAI/SIR/STS/SVM Guidelines for the Diagnosis and Management of Patients With Thoracic Aortic Disease. Circulation. 2010; 121: [id number]. Note that this aortic prominence is essentially stable compared to 2012 study. There is no periaortic fluid. No thoracic aortic dissection noted. No pericardial effusion. There are multiple areas of coronary artery calcification. No parenchymal lung edema or consolidation. No parenchymal lung mass or adenopathy. Cholelithiasis. Stable cystic lesion containing multiple calcifications in  the upper pole left kidney. Parapelvic cyst right kidney. Electronically Signed   By: Lowella Grip III M.D.   On: 07/24/2015 11:00    I have independently reviewed the above radiology studies  and reviewed the findings with the patient.  ECHO: Echocardiography  Patient:    Wasyl, Dornfeld MR #:       [id number] Study Date: 09/20/2015 Gender:     M Age:        29 Height:     175.3 cm Weight:     91.6 kg BSA:        2.14 m^2 Pt. Status: Room:   [place] MD  [place] MD  REFERRING    [name] [id number]  ATTENDING    Candee Furbish, M.D.  SONOGRAPHER  Cindy Hazy, RDCS  PERFORMING   [facility], Outpatient  cc:  ------------------------------------------------------------------- LV EF: 60% -   65%  ------------------------------------------------------------------- Indications:      I35.1 Aortic Insufficiency.  I71.2 Ascending Aortic Aneurysm.  ------------------------------------------------------------------- History:   PMH:  Acquired from the patient and from the patient&'s chart.  PMH:  Dilated Thoracic Aorta.  Risk factors:  Hypertension. Dyslipidemia.  ------------------------------------------------------------------- Study Conclusions  - Left ventricle: The cavity size was normal. There was moderate   focal basal hypertrophy of the septum. Systolic function was   normal. The estimated ejection fraction was in the range of 60%   to 65%. Wall motion was normal; there were no regional wall   motion abnormalities. Doppler parameters are consistent with   abnormal left ventricular relaxation (grade 1 diastolic   dysfunction). - Aortic valve: There was moderate to severe regurgitation. - Aorta: Aortic root dimension: 43 mm (ED). - Ascending aorta: The ascending aorta was mildly dilated. - Right ventricle: The cavity size was mildly dilated. Wall   thickness was  normal.  ------------------------------------------------------------------- Study data:   Study status:  Routine.  Procedure:  The patient reported no pain pre or post test. Transthoracic echocardiography for left ventricular function evaluation, for right ventricular function evaluation, and for assessment of valvular function. Image quality was adequate.  Study completion:  There were no complications.          Echocardiography.  M-mode, complete 2D, spectral Doppler, and color Doppler.  Birthdate:  Patient birthdate: 09/15/1950.  Age:  Patient is 66 yr old.  Sex:  Gender: male.    BMI: 29.8 kg/m^2.  Blood pressure:     136/74  Patient status:  Outpatient.  Study date:  Study date: 09/20/2015. Study time: 10:58 AM.  Location:  [facility]  -------------------------------------------------------------------  ------------------------------------------------------------------- Left ventricle:  The cavity size was normal. There was moderate focal basal hypertrophy of the septum. Systolic function was normal. The estimated ejection fraction was in the range of 60% to 65%. Wall motion was normal; there were no regional wall motion abnormalities. Doppler parameters are consistent with abnormal left ventricular relaxation (grade 1 diastolic dysfunction).  ------------------------------------------------------------------- Aortic valve:   Trileaflet; normal thickness leaflets. Mobility was not restricted.  Doppler:  Transvalvular velocity was within the normal range. There was no stenosis. There was moderate to severe regurgitation.  ------------------------------------------------------------------- Aorta:  Ascending aorta: The ascending aorta was mildly dilated.  ------------------------------------------------------------------- Mitral valve:   Structurally normal valve.   Mobility was not restricted.  Doppler:  Transvalvular velocity was within the normal range. There  was no evidence for stenosis. There was no regurgitation.  ------------------------------------------------------------------- Left atrium:  The atrium was normal in size.  ------------------------------------------------------------------- Right ventricle:  The cavity size was mildly dilated. Wall thickness was normal. Systolic function was normal.  ------------------------------------------------------------------- Pulmonic valve:    Doppler:  Transvalvular velocity was within the normal range. There was no evidence for stenosis.  ------------------------------------------------------------------- Tricuspid valve:   Structurally normal valve.    Doppler: Transvalvular velocity was within the normal range. There was trivial regurgitation.  ------------------------------------------------------------------- Pulmonary artery:   The main pulmonary artery was normal-sized. Systolic pressure was within the normal range.  ------------------------------------------------------------------- Right atrium:  The atrium was normal in size.  ------------------------------------------------------------------- Pericardium:  There was no pericardial effusion.  ------------------------------------------------------------------- Systemic veins: Inferior vena cava: The vessel was normal in size.  ------------------------------------------------------------------- Measurements   Left ventricle                           Value        Reference  LV ID, ED, PLAX chordal                  50.9  mm     43 - 52  LV ID, ES, PLAX chordal                  34.6  mm     23 - 38  LV fx shortening, PLAX chordal           32    %      >=29  LV PW thickness, ED                      10.9  mm     ---------  IVS/LV PW ratio, ED              (H)     1.39         <=1.3  Stroke volume, 2D                        124   ml     ---------  Stroke volume/bsa, 2D                    58    ml/m^2 ---------  LV  e&', lateral                           5.26  cm/s   ---------  LV E/e&', lateral  7.7          ---------  LV e&', medial                            5.55  cm/s   ---------  LV E/e&', medial                          7.3          ---------  LV e&', average                           5.41  cm/s   ---------  LV E/e&', average                         7.49         ---------    Ventricular septum                       Value        Reference  IVS thickness, ED                        15.2  mm     ---------    LVOT                                     Value        Reference  LVOT ID, S                               30    mm     ---------  LVOT area                                7.07  cm^2   ---------  LVOT ID                                  30    mm     ---------  LVOT peak velocity, S                    76.7  cm/s   ---------  LVOT mean velocity, S                    53.7  cm/s   ---------  LVOT VTI, S                              17.5  cm     ---------  LVOT peak gradient, S                    2     mm Hg  ---------  Stroke volume (SV), LVOT DP              123.7 ml     ---------  Stroke index (SV/bsa), LVOT DP           57.9  ml/m^2 ---------    Aortic valve  Value        Reference  Aortic regurg pressure half-time         916   ms     ---------    Aorta                                    Value        Reference  Aortic root ID, ED                       43    mm     ---------    Left atrium                              Value        Reference  LA ID, A-P, ES                           33    mm     ---------  LA ID/bsa, A-P                           1.54  cm/m^2 <=2.2  LA volume, S                             37    ml     ---------  LA volume/bsa, S                         17.3  ml/m^2 ---------  LA volume, ES, 1-p A4C                   31    ml     ---------  LA volume/bsa, ES, 1-p A4C               14.5  ml/m^2 ---------  LA volume, ES, 1-p  A2C                   44    ml     ---------  LA volume/bsa, ES, 1-p A2C               20.6  ml/m^2 ---------    Mitral valve                             Value        Reference  Mitral E-wave peak velocity              40.5  cm/s   ---------  Mitral A-wave peak velocity              57.8  cm/s   ---------  Mitral deceleration time         (H)     257   ms     150 - 230  Mitral E/A ratio, peak                   0.7          ---------    Tricuspid valve  Value        Reference  Tricuspid regurg peak velocity           251   cm/s   ---------  Tricuspid peak RV-RA gradient            25    mm Hg  ---------    Right ventricle                          Value        Reference  RV s&', lateral, S                        18    cm/s   ---------  Legend: (L)  and  (H)  mark values outside specified reference range.  ------------------------------------------------------------------- Prepared and Electronically Authenticated by  Candee Furbish, M.D. 2017-08-25T13:27:13  There is  moderate to severe regurgitation On echo  EF 60- 65%   LV ID, ED, PLAX chordal                  50.9  mm     43 - 52  LV ID, ES, PLAX chordal                  34.6  mm     23 - 38    Cath: Procedures   Aortic Arch Angiography  Right/Left Heart Cath and Coronary Angiography  Conclusion     Mid RCA lesion, 80 %stenosed.  Ost LAD to Prox LAD lesion, 25 %stenosed.  Dist LAD lesion, 50 %stenosed.  There is mild left ventricular systolic dysfunction.  LV end diastolic pressure is normal.  The left ventricular ejection fraction is 45-50% by visual estimate.  There is no mitral valve regurgitation.  There is no aortic valve stenosis.  LV end diastolic pressure is normal.   1. 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 Mild LV dysfunction. EF 45%. 3. Ascending thoracic aortic aneurysm. 4. 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 5. Normal Right heart  And LV filling  pressures 6. Normal cardiac output.   Plan: Aortic root grafting and AVR.      I have independently reviewed the above  cath films and reviewed the findings with the  patient .   Recent Lab Findings:    Aortic Size Index=      5.2  /Body surface area is 2.1 meters squared. = 2.5  < 2.75 cm/m2      4% risk per year 2.75 to 4.25          8% risk per year > 4.25 cm/m2    20% risk per year  The [employer] [facility]) and the [place] (AHA) have issued a statement to clarify 2 previous guidelines from the [facility], [place], [place], and collaborating societies addressing the risk of aortic dissection in patients with bicuspid aortic valves (BAV) and severe aortic enlargement. The 2 guidelines differ with regard to the recommended threshold of aortic root or ascending aortic dilatation that would justify surgical intervention in patients with bicuspid aortic valves. This new statement of clarification uses the ACC/AHA revised structure for delineating the Class of Recommendation and Level of Evidence to provide recommendation that replace those contained in Section 9.2.2.1 of the thoracic aortic disease guidelines and Section 5.1.3 of the valvular heart disease guideline. New recommendations in intervention in patients with BAV and dilatation of the aortic root (  sinuses) or ascending aorta include:  . Operative intervention to repair or replace the aortic root (sinuses) or replace the ascending aorta is indicated in asymptomatic patients with BAV if the diameter of the aortic root or ascending aorta is 5.5 cm or greater. (Class of recommendation 1, Level of evidence B-NR).  Marland Kitchen Operative intervention to repair or replace the aortic root (sinuses) or replace the ascending aorta is reasonable in asymptomatic patients with BAV if the diameter of the aortic root or ascending aorta is 5.0 cm or greater and an additional risk factor for dissection is present or if the patient is at low  surgical risk and the surgery is performed by an experienced aortic surgical team in a center with established expertise in these procedures. (Class of recommendation IIa; Level of Evidence B-NR).  . Replacement of the ascending aorta is reasonable in patients with BAV undergoing AVR because of severe aortic stenosis or aortic regurgitation when the diameter of the ascending aorta is greater than 4.5 cm (Class of recommendation IIa; Level of evidence C-EO).  Citation: Donnamarie Poag, Randolm Idol, et al. Surgery for aortic dilatation in patients with bicuspid aortic valves. A statement of clarification from the [employer]/American Heart Association Task Force on Clinical Practice Guidelines. [Published online ahead of print December 29, 2013]. Circulation. doi: 10.1161/CIR.0000000000000331.  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  Root replacement Aug 13 Now with the patient's scalp infection cleared I recommended that we proceed with the planned aortic root and ascending aorta replacement Risks and options have been discussed with patient in detail, and addition we have discussed possible need for permanent pacemaker, we have discussed mechanical versus tissue valve replacement, the patient prefers tissue valve.     Grace Isaac MD      [address] [place] [address] Office [phone]   Beeper [phone]  08/13/2016 5:27 PM

## 2016-08-18 ENCOUNTER — Other Ambulatory Visit: Payer: Self-pay | Admitting: *Deleted

## 2016-08-18 DIAGNOSIS — I712 Thoracic aortic aneurysm, without rupture, unspecified: Secondary | ICD-10-CM

## 2016-08-18 DIAGNOSIS — I351 Nonrheumatic aortic (valve) insufficiency: Secondary | ICD-10-CM

## 2016-08-18 DIAGNOSIS — I2510 Atherosclerotic heart disease of native coronary artery without angina pectoris: Secondary | ICD-10-CM

## 2016-09-11 ENCOUNTER — Encounter (HOSPITAL_COMMUNITY)
Admission: RE | Admit: 2016-09-11 | Discharge: 2016-09-11 | Disposition: A | Discharge status: Home / Self Care | Payer: Medicare Other | Source: Ambulatory Visit | Attending: Cardiothoracic Surgery | Admitting: Cardiothoracic Surgery

## 2016-09-11 ENCOUNTER — Ambulatory Visit (HOSPITAL_COMMUNITY)
Admission: RE | Admit: 2016-09-11 | Discharge: 2016-09-11 | Disposition: A | Discharge status: Home / Self Care | Payer: Medicare Other | Source: Ambulatory Visit | Attending: Cardiothoracic Surgery | Admitting: Cardiothoracic Surgery

## 2016-09-11 ENCOUNTER — Encounter (HOSPITAL_COMMUNITY): Payer: Self-pay

## 2016-09-11 DIAGNOSIS — I351 Nonrheumatic aortic (valve) insufficiency: Secondary | ICD-10-CM

## 2016-09-11 DIAGNOSIS — I10 Essential (primary) hypertension: Secondary | ICD-10-CM | POA: Diagnosis not present

## 2016-09-11 DIAGNOSIS — K219 Gastro-esophageal reflux disease without esophagitis: Secondary | ICD-10-CM | POA: Diagnosis not present

## 2016-09-11 DIAGNOSIS — Z01818 Encounter for other preprocedural examination: Secondary | ICD-10-CM | POA: Insufficient documentation

## 2016-09-11 DIAGNOSIS — I712 Thoracic aortic aneurysm, without rupture, unspecified: Secondary | ICD-10-CM

## 2016-09-11 DIAGNOSIS — Z0183 Encounter for blood typing: Secondary | ICD-10-CM | POA: Diagnosis not present

## 2016-09-11 DIAGNOSIS — Z87891 Personal history of nicotine dependence: Secondary | ICD-10-CM | POA: Diagnosis not present

## 2016-09-11 DIAGNOSIS — H409 Unspecified glaucoma: Secondary | ICD-10-CM | POA: Insufficient documentation

## 2016-09-11 DIAGNOSIS — Z79899 Other long term (current) drug therapy: Secondary | ICD-10-CM | POA: Insufficient documentation

## 2016-09-11 DIAGNOSIS — I6523 Occlusion and stenosis of bilateral carotid arteries: Secondary | ICD-10-CM | POA: Insufficient documentation

## 2016-09-11 DIAGNOSIS — G4733 Obstructive sleep apnea (adult) (pediatric): Secondary | ICD-10-CM | POA: Diagnosis not present

## 2016-09-11 DIAGNOSIS — I2510 Atherosclerotic heart disease of native coronary artery without angina pectoris: Secondary | ICD-10-CM

## 2016-09-11 DIAGNOSIS — E785 Hyperlipidemia, unspecified: Secondary | ICD-10-CM | POA: Insufficient documentation

## 2016-09-11 DIAGNOSIS — Z01812 Encounter for preprocedural laboratory examination: Secondary | ICD-10-CM | POA: Diagnosis not present

## 2016-09-11 HISTORY — DX: Atherosclerotic heart disease of native coronary artery without angina pectoris: I25.10

## 2016-09-11 HISTORY — DX: Sleep apnea, unspecified: G47.30

## 2016-09-11 HISTORY — DX: Nodules of vocal cords: J38.2

## 2016-09-11 HISTORY — DX: Other injury of unspecified body region, initial encounter: T14.8XXA

## 2016-09-11 LAB — PULMONARY FUNCTION TEST
DL/VA % pred: 85 %
DL/VA: 3.9 ml/min/mmHg/L
DLCO unc % pred: 66 %
DLCO unc: 20.61 ml/min/mmHg
FEF 25-75 Post: 2.95 L/s
FEF 25-75 Pre: 2.72 L/s
FEF2575-%Change-Post: 8 %
FEF2575-%Pred-Post: 116 %
FEF2575-%Pred-Pre: 107 %
FEV1-%Change-Post: 1 %
FEV1-%Pred-Post: 106 %
FEV1-%Pred-Pre: 104 %
FEV1-Post: 3.02 L
FEV1-Pre: 2.98 L
FEV1FVC-%Change-Post: 2 %
FEV1FVC-%Pred-Pre: 100 %
FEV6-%Change-Post: -1 %
FEV6-%Pred-Post: 106 %
FEV6-%Pred-Pre: 107 %
FEV6-Post: 3.81 L
FEV6-Pre: 3.85 L
FEV6FVC-%Pred-Post: 104 %
FEV6FVC-%Pred-Pre: 104 %
FVC-%Change-Post: -1 %
FVC-%Pred-Post: 102 %
FVC-%Pred-Pre: 103 %
FVC-Post: 3.81 L
FVC-Pre: 3.85 L
Post FEV1/FVC ratio: 79 %
Post FEV6/FVC ratio: 100 %
Pre FEV1/FVC ratio: 77 %
Pre FEV6/FVC Ratio: 100 %
RV % pred: 99 %
RV: 2.33 L
TLC % pred: 87 %
TLC: 5.99 L

## 2016-09-11 LAB — URINALYSIS, ROUTINE W REFLEX MICROSCOPIC
Bacteria, UA: NONE SEEN
Bilirubin Urine: NEGATIVE
Glucose, UA: NEGATIVE mg/dL
Ketones, ur: NEGATIVE mg/dL
Leukocytes, UA: NEGATIVE
Nitrite: NEGATIVE
Protein, ur: NEGATIVE mg/dL
Specific Gravity, Urine: 1.013 (ref 1.005–1.030)
Squamous Epithelial / LPF: NONE SEEN
pH: 5 (ref 5.0–8.0)

## 2016-09-11 LAB — BLOOD GAS, ARTERIAL
Acid-Base Excess: 1.1 mmol/L (ref 0.0–2.0)
Bicarbonate: 25.1 mmol/L (ref 20.0–28.0)
Drawn by: 449841
FIO2: 21
O2 Saturation: 90.5 %
Patient temperature: 98.6
pCO2 arterial: 39.6 mmHg (ref 32.0–48.0)
pH, Arterial: 7.419 (ref 7.350–7.450)
pO2, Arterial: 61.7 mmHg — ABNORMAL LOW (ref 83.0–108.0)

## 2016-09-11 LAB — COMPREHENSIVE METABOLIC PANEL WITH GFR
ALT: 17 U/L (ref 17–63)
AST: 31 U/L (ref 15–41)
Albumin: 4.4 g/dL (ref 3.5–5.0)
Alkaline Phosphatase: 46 U/L (ref 38–126)
Anion gap: 10 (ref 5–15)
BUN: 13 mg/dL (ref 6–20)
CO2: 25 mmol/L (ref 22–32)
Calcium: 9.4 mg/dL (ref 8.9–10.3)
Chloride: 105 mmol/L (ref 101–111)
Creatinine, Ser: 0.86 mg/dL (ref 0.61–1.24)
GFR calc Af Amer: >60 mL/min (ref >60)
GFR calc non Af Amer: >60 mL/min (ref >60)
Glucose, Bld: 100 mg/dL — ABNORMAL HIGH (ref 65–99)
Potassium: 3.5 mmol/L (ref 3.5–5.1)
Sodium: 140 mmol/L (ref 135–145)
Total Bilirubin: 0.7 mg/dL (ref 0.3–1.2)
Total Protein: 7.2 g/dL (ref 6.5–8.1)

## 2016-09-11 LAB — CBC
HCT: 39.2 % (ref 39.0–52.0)
Hemoglobin: 13.7 g/dL (ref 13.0–17.0)
MCH: 31.6 pg (ref 26.0–34.0)
MCHC: 34.9 g/dL (ref 30.0–36.0)
MCV: 90.3 fL (ref 78.0–100.0)
Platelets: 97 10*3/uL — ABNORMAL LOW (ref 150–400)
RBC: 4.34 MIL/uL (ref 4.22–5.81)
RDW: 14 % (ref 11.5–15.5)
WBC: 7.2 10*3/uL (ref 4.0–10.5)

## 2016-09-11 LAB — PROTIME-INR
INR: 1.01
Prothrombin Time: 13.3 s (ref 11.4–15.2)

## 2016-09-11 LAB — SURGICAL PCR SCREEN
MRSA, PCR: NEGATIVE
Staphylococcus aureus: NEGATIVE

## 2016-09-11 LAB — APTT: aPTT: 27 s (ref 24–36)

## 2016-09-11 LAB — HEMOGLOBIN A1C
Hgb A1c MFr Bld: 5.3 % (ref 4.8–5.6)
Mean Plasma Glucose: 105.41 mg/dL

## 2016-09-11 MED ORDER — ALBUTEROL SULFATE (2.5 MG/3ML) 0.083% IN NEBU
2.5000 mg | INHALATION_SOLUTION | Freq: Once | RESPIRATORY_TRACT | Status: AC
  Administered 2016-09-11: 2.5 mg via RESPIRATORY_TRACT

## 2016-09-11 NOTE — Progress Notes (Signed)
Pre-op Cardiac Surgery  Carotid Findings:   Findings are consistent with a 1-39 percent stenosis involving the right internal carotid artery and the left internal carotid artery. The vertebral arteries demonstrate antegrade flow.  Upper Extremity Right Left  Brachial Pressures 146  Triphasic 163  Triphasic  Radial Waveforms Triphasic Triphasic  Ulnar Waveforms Triphasic Triphasic  Palmar Arch (Allen's Test) Palmar waveforms remain within normal limits with radial compression and are obliterated with ulnar compression. Palmar waveforms remain within normal limits with radial compression and are obliterated with ulnar compression.    09/11/16 9:55 AM Lonnie Hansen RVT

## 2016-09-11 NOTE — Progress Notes (Signed)
PCP: Dr. Sharlett Iles @ [facility] Cardiologist: Dr. Neita Garnet

## 2016-09-11 NOTE — Pre-Procedure Instructions (Addendum)
Lonnie Hansen  09/11/2016      [facility] [address] [place], [place] [address] Phone: [phone] Fax: [phone]    Your procedure is scheduled on Tues. Aug. 21  Report to [facility] Admitting at 5:30 A.M.  Call this number if you have problems the morning of surgery:  [phone]   Remember:  Do not eat food or drink liquids after midnight on Mon. Aug. 20   Take these medicines the morning of surgery with A SIP OF WATER : tylenol if needed,amlodipine (norvasc), pantoprazole (protonix)             1 week prior to surgery stop aspirin, advil, motrin, ibuprofen, aleve, vitamins/herbal medicines.   Do not wear jewelry.  Do not wear lotions, powders, or perfumes, or deoderant.  Do not shave 48 hours prior to surgery.  Men may shave face and neck.  Do not bring valuables to the hospital.  [facility] is not responsible for any belongings or valuables.  Contacts, dentures or bridgework may not be worn into surgery.  Leave your suitcase in the car.  After surgery it may be brought to your room.  For patients admitted to the hospital, discharge time will be determined by your treatment team.  Patients discharged the day of surgery will not be allowed to drive home.    Special instructions:  [facility]- Preparing For Surgery  Before surgery, you can play an important role. Because skin is not sterile, your skin needs to be as free of germs as possible. You can reduce the number of germs on your skin by washing with CHG (chlorahexidine gluconate) Soap before surgery.  CHG is an antiseptic cleaner which kills germs and bonds with the skin to continue killing germs even after washing.  Please do not use if you have an allergy to CHG or antibacterial soaps. If your skin becomes reddened/irritated stop using the CHG.  Do not shave (including legs and underarms) for at least 48 hours prior to  first CHG shower. It is OK to shave your face.  Please follow these instructions carefully.   1. Shower the NIGHT BEFORE SURGERY and the MORNING OF SURGERY with CHG.   2. If you chose to wash your hair, wash your hair first as usual with your normal shampoo.  3. After you shampoo, rinse your hair and body thoroughly to remove the shampoo.  4. Use CHG as you would any other liquid soap. You can apply CHG directly to the skin and wash gently with a scrungie or a clean washcloth.   5. Apply the CHG Soap to your body ONLY FROM THE NECK DOWN.  Do not use on open wounds or open sores. Avoid contact with your eyes, ears, mouth and genitals (private parts). Wash genitals (private parts) with your normal soap.  6. Wash thoroughly, paying special attention to the area where your surgery will be performed.  7. Thoroughly rinse your body with warm water from the neck down.  8. DO NOT shower/wash with your normal soap after using and rinsing off the CHG Soap.  9. Pat yourself dry with a CLEAN TOWEL.   10. Wear CLEAN PAJAMAS   11. Place CLEAN SHEETS on your bed the night of your first shower and DO NOT SLEEP WITH PETS.    Day of Surgery: Do not apply any deodorants/lotions. Please wear clean clothes to the hospital/surgery center.  Please read over the following fact sheets that you were given. Coughing and Deep Breathing, MRSA Information and Surgical Site Infection Prevention

## 2016-09-13 LAB — VAS US DOPPLER PRE CABG
LEFT ECA DIAS: -5 cm/s
Left CCA dist dias: -14 cm/s
Left CCA dist sys: -64 cm/s
Left CCA prox dias: 14 cm/s
Left CCA prox sys: 147 cm/s
Left ICA dist dias: -18 cm/s
Left ICA dist sys: -42 cm/s
Left ICA prox dias: -13 cm/s
Left ICA prox sys: -57 cm/s
RIGHT ECA DIAS: -17 cm/s
RIGHT VERTEBRAL DIAS: -10 cm/s
Right CCA prox dias: 12 cm/s
Right CCA prox sys: 69 cm/s
Right cca dist sys: 39 cm/s

## 2016-09-14 ENCOUNTER — Encounter (HOSPITAL_COMMUNITY): Payer: Self-pay | Admitting: Certified Registered Nurse Anesthetist

## 2016-09-14 MED ORDER — SODIUM CHLORIDE 0.9 % IV SOLN
30.0000 ug/min | INTRAVENOUS | Status: AC
  Administered 2016-09-15: 20 ug/min via INTRAVENOUS
  Filled 2016-09-14: qty 2

## 2016-09-14 MED ORDER — PLASMA-LYTE 148 IV SOLN
INTRAVENOUS | Status: AC
  Administered 2016-09-15: 500 mL
  Filled 2016-09-14: qty 2.5

## 2016-09-14 MED ORDER — TRANEXAMIC ACID (OHS) BOLUS VIA INFUSION
15.0000 mg/kg | INTRAVENOUS | Status: AC
  Administered 2016-09-15: 1389 mg via INTRAVENOUS
  Filled 2016-09-14: qty 1389

## 2016-09-14 MED ORDER — DEXTROSE 5 % IV SOLN
750.0000 mg | INTRAVENOUS | Status: DC
  Filled 2016-09-14: qty 750

## 2016-09-14 MED ORDER — SODIUM CHLORIDE 0.9 % IV SOLN
INTRAVENOUS | Status: DC
  Filled 2016-09-14: qty 30

## 2016-09-14 MED ORDER — DEXMEDETOMIDINE HCL IN NACL 400 MCG/100ML IV SOLN
0.1000 ug/kg/h | INTRAVENOUS | Status: AC
  Administered 2016-09-15: 14:00:00 via INTRAVENOUS
  Administered 2016-09-15: .5 ug/kg/h via INTRAVENOUS
  Filled 2016-09-14: qty 100

## 2016-09-14 MED ORDER — VANCOMYCIN HCL 10 G IV SOLR
1500.0000 mg | INTRAVENOUS | Status: AC
  Administered 2016-09-15: 1500 mg via INTRAVENOUS
  Filled 2016-09-14: qty 1500

## 2016-09-14 MED ORDER — SODIUM CHLORIDE 0.9 % IV SOLN
INTRAVENOUS | Status: AC
  Administered 2016-09-15: .8 [IU]/h via INTRAVENOUS
  Filled 2016-09-14: qty 1

## 2016-09-14 MED ORDER — DEXTROSE 5 % IV SOLN
1.5000 g | INTRAVENOUS | Status: AC
  Administered 2016-09-15: 1.5 g via INTRAVENOUS
  Administered 2016-09-15: .75 g via INTRAVENOUS
  Filled 2016-09-14: qty 1.5

## 2016-09-14 MED ORDER — EPINEPHRINE PF 1 MG/ML IJ SOLN
0.0000 ug/min | INTRAVENOUS | Status: AC
  Administered 2016-09-15: 1 ug/min via INTRAVENOUS
  Filled 2016-09-14: qty 4

## 2016-09-14 MED ORDER — TRANEXAMIC ACID (OHS) PUMP PRIME SOLUTION
2.0000 mg/kg | INTRAVENOUS | Status: DC
  Filled 2016-09-14: qty 1.85

## 2016-09-14 MED ORDER — MAGNESIUM SULFATE 50 % IJ SOLN
40.0000 meq | INTRAMUSCULAR | Status: DC
  Filled 2016-09-14: qty 10

## 2016-09-14 MED ORDER — DOPAMINE-DEXTROSE 3.2-5 MG/ML-% IV SOLN
0.0000 ug/kg/min | INTRAVENOUS | Status: AC
  Administered 2016-09-15: 3 ug/kg/min via INTRAVENOUS
  Filled 2016-09-14: qty 250

## 2016-09-14 MED ORDER — NITROGLYCERIN IN D5W 200-5 MCG/ML-% IV SOLN
2.0000 ug/min | INTRAVENOUS | Status: DC
  Filled 2016-09-14: qty 250

## 2016-09-14 MED ORDER — SODIUM CHLORIDE 0.9 % IV SOLN
1.5000 mg/kg/h | INTRAVENOUS | Status: AC
  Administered 2016-09-15: 1.5 mg/kg/h via INTRAVENOUS
  Filled 2016-09-14: qty 25

## 2016-09-14 MED ORDER — POTASSIUM CHLORIDE 2 MEQ/ML IV SOLN
80.0000 meq | INTRAVENOUS | Status: DC
  Filled 2016-09-14: qty 40

## 2016-09-14 NOTE — Anesthesia Preprocedure Evaluation (Addendum)
Anesthesia Evaluation  Patient identified by MRN, date of birth, ID band Patient awake    Reviewed: Allergy & Precautions, H&P , NPO status , Patient's Chart, lab work & pertinent test results  Airway Mallampati: II  TM Distance: >3 FB Neck ROM: Full    Dental  (+) Dental Advisory Given, Partial Upper, Partial Lower, Missing,    Pulmonary neg pulmonary ROS, sleep apnea , former smoker,    breath sounds clear to auscultation       Cardiovascular Exercise Tolerance: Good hypertension, Pt. on medications and Pt. on home beta blockers + CAD  negative cardio ROS  + Valvular Problems/Murmurs AI  Rhythm:Regular Rate:Normal     Neuro/Psych  Headaches, negative neurological ROS  negative psych ROS   GI/Hepatic negative GI ROS, Neg liver ROS, GERD  Medicated,  Endo/Other  negative endocrine ROS  Renal/GU negative Renal ROS  negative genitourinary   Musculoskeletal  (+) Arthritis , Osteoarthritis,    Abdominal   Peds  Hematology negative hematology ROS (+)   Anesthesia Other Findings   Reproductive/Obstetrics negative OB ROS                          Anesthesia Physical Anesthesia Plan  ASA: IV  Anesthesia Plan: General   Post-op Pain Management:    Induction: Intravenous  PONV Risk Score and Plan: 2 and Midazolam and Treatment may vary due to age or medical condition  Airway Management Planned: Oral ETT  Additional Equipment: Arterial line, CVP, PA Cath, TEE and Ultrasound Guidance Line Placement  Intra-op Plan:   Post-operative Plan: Post-operative intubation/ventilation  Informed Consent: I have reviewed the patients History and Physical, chart, labs and discussed the procedure including the risks, benefits and alternatives for the proposed anesthesia with the patient or authorized representative who has indicated his/her understanding and acceptance.   Dental advisory given  Plan  Discussed with: CRNA  Anesthesia Plan Comments:         Anesthesia Quick Evaluation

## 2016-09-14 NOTE — Progress Notes (Signed)
Anesthesia Chart Review:  Pt is a 66 year old male scheduled for aortic valve replacement, CABG, ascending aortic root replacement on 09/15/2016 with Lanelle Bal, MD  - PCP is [name], MD - Cardiologist is Peter Martinique, MD  PMH includes:  CAD, HTN, hyperlipidemia, thoracic aortic aneurysm, moderate to severe aortic insufficiency, OSA, anemia, glaucoma, GERD. Former smoker. BMI 30.  Medications include: Amlodipine, atenolol, Lumigan, Protonix  Preoperative labs reviewed. - Platelets 97; this is consistent with prior results   - Dr. Servando Snare has reviewed lab results  CXR 09/11/16: Thoracic aortic aneurysm again noted without interim change. No acute pulmonary disease.  EKG 04/16/16: NSR. Nonspecific T wave abnormality.  Carotid duplex 09/11/16:  - 1-39 percent stenosis involving the right internal carotid artery and the left internal carotid artery. 2. The vertebral arteries demonstrate antegrade flow.    Aortic arch angiography 04/16/16 R & L cardiac cath 04/16/16:   Mid RCA lesion, 80 %stenosed.  Ost LAD to Prox LAD lesion, 25 %stenosed.  Dist LAD lesion, 50 %stenosed.  There is mild left ventricular systolic dysfunction.  LV end diastolic pressure is normal.  The left ventricular ejection fraction is 45-50% by visual estimate.  There is no mitral valve regurgitation.  There is no aortic valve stenosis.  LV end diastolic pressure is normal. 1. 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 Mild LV dysfunction. EF 45%. 3. Ascending thoracic aortic aneurysm. 4. 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 5. Normal Right heart And LV filling pressures 6. Normal cardiac output.  - Plan: Aortic root grafting and AVR.   CT Angio chest 03/26/16: - Stable aneurysmal dilatation of aortic root is noted at 5.6 cm. - Stable 4.1 cm ascending thoracic aortic aneurysm is noted. Recommend annual imaging followup by CTA or MRA.  - Coronary artery calcifications are  noted suggesting coronary disease. - Large gallstone is noted. - Addendum: The aortic root at the level of the sinuses of Valsalva was remeasured at approximately 4.9- 5.2 cm in greatest diameter depending on axis of measurement. This appears stable compared to the prior coronary CTA on 08/28/2015.  Echo 09/20/15:  - Left ventricle: The cavity size was normal. There was moderatefocal basal hypertrophy of the septum. Systolic function wasnormal. The estimated ejection fraction was in the range of 60%to 65%. Wall motion was normal; there were no regional wallmotion abnormalities. Doppler parameters are consistent with  abnormal left ventricular relaxation (grade 1 diastolic dysfunction). - Aortic valve: There was moderate to severe regurgitation. - Aorta: Aortic root dimension: 43 mm (ED). - Ascending aorta: The ascending aorta was mildly dilated. - Right ventricle: The cavity size was mildly dilated. Wall thickness was normal.  If no changes, I anticipate pt can proceed with surgery as scheduled.   Willeen Cass, FNP-BC [facility]/Anesthesiology Phone: [phone] 09/14/2016 10:53 AM

## 2016-09-15 ENCOUNTER — Inpatient Hospital Stay (HOSPITAL_COMMUNITY): Payer: Medicare Other | Admitting: Certified Registered Nurse Anesthetist

## 2016-09-15 ENCOUNTER — Inpatient Hospital Stay (HOSPITAL_COMMUNITY): Payer: Medicare Other

## 2016-09-15 ENCOUNTER — Inpatient Hospital Stay (HOSPITAL_COMMUNITY)
Admission: RE | Admit: 2016-09-15 | Discharge: 2016-09-23 | DRG: 220 | Disposition: A | Discharge status: Home / Self Care | Payer: Medicare Other | Source: Ambulatory Visit | Attending: Cardiothoracic Surgery | Admitting: Cardiothoracic Surgery

## 2016-09-15 ENCOUNTER — Encounter (HOSPITAL_COMMUNITY): Payer: Self-pay | Admitting: Urology

## 2016-09-15 ENCOUNTER — Inpatient Hospital Stay (HOSPITAL_COMMUNITY): Payer: Medicare Other | Admitting: Vascular Surgery

## 2016-09-15 ENCOUNTER — Encounter (HOSPITAL_COMMUNITY)
Admission: RE | Disposition: A | Discharge status: Home / Self Care | Payer: Self-pay | Source: Ambulatory Visit | Attending: Cardiothoracic Surgery

## 2016-09-15 DIAGNOSIS — R1310 Dysphagia, unspecified: Secondary | ICD-10-CM | POA: Diagnosis present

## 2016-09-15 DIAGNOSIS — T501X5A Adverse effect of loop [high-ceiling] diuretics, initial encounter: Secondary | ICD-10-CM | POA: Diagnosis not present

## 2016-09-15 DIAGNOSIS — Y92230 Patient room in hospital as the place of occurrence of the external cause: Secondary | ICD-10-CM | POA: Diagnosis not present

## 2016-09-15 DIAGNOSIS — I10 Essential (primary) hypertension: Secondary | ICD-10-CM | POA: Diagnosis present

## 2016-09-15 DIAGNOSIS — Z87442 Personal history of urinary calculi: Secondary | ICD-10-CM | POA: Diagnosis not present

## 2016-09-15 DIAGNOSIS — E785 Hyperlipidemia, unspecified: Secondary | ICD-10-CM | POA: Diagnosis present

## 2016-09-15 DIAGNOSIS — Z87891 Personal history of nicotine dependence: Secondary | ICD-10-CM | POA: Diagnosis not present

## 2016-09-15 DIAGNOSIS — Z952 Presence of prosthetic heart valve: Secondary | ICD-10-CM | POA: Diagnosis not present

## 2016-09-15 DIAGNOSIS — I351 Nonrheumatic aortic (valve) insufficiency: Secondary | ICD-10-CM | POA: Diagnosis present

## 2016-09-15 DIAGNOSIS — Z807 Family history of other malignant neoplasms of lymphoid, hematopoietic and related tissues: Secondary | ICD-10-CM

## 2016-09-15 DIAGNOSIS — I088 Other rheumatic multiple valve diseases: Secondary | ICD-10-CM | POA: Diagnosis not present

## 2016-09-15 DIAGNOSIS — J9811 Atelectasis: Secondary | ICD-10-CM

## 2016-09-15 DIAGNOSIS — E876 Hypokalemia: Secondary | ICD-10-CM | POA: Diagnosis not present

## 2016-09-15 DIAGNOSIS — J90 Pleural effusion, not elsewhere classified: Secondary | ICD-10-CM | POA: Diagnosis not present

## 2016-09-15 DIAGNOSIS — I361 Nonrheumatic tricuspid (valve) insufficiency: Secondary | ICD-10-CM | POA: Diagnosis not present

## 2016-09-15 DIAGNOSIS — M25551 Pain in right hip: Secondary | ICD-10-CM | POA: Diagnosis present

## 2016-09-15 DIAGNOSIS — I4891 Unspecified atrial fibrillation: Secondary | ICD-10-CM | POA: Diagnosis not present

## 2016-09-15 DIAGNOSIS — Z09 Encounter for follow-up examination after completed treatment for conditions other than malignant neoplasm: Secondary | ICD-10-CM

## 2016-09-15 DIAGNOSIS — Z4682 Encounter for fitting and adjustment of non-vascular catheter: Secondary | ICD-10-CM | POA: Diagnosis not present

## 2016-09-15 DIAGNOSIS — Z86010 Personal history of colonic polyps: Secondary | ICD-10-CM

## 2016-09-15 DIAGNOSIS — I712 Thoracic aortic aneurysm, without rupture, unspecified: Secondary | ICD-10-CM

## 2016-09-15 DIAGNOSIS — Z8249 Family history of ischemic heart disease and other diseases of the circulatory system: Secondary | ICD-10-CM | POA: Diagnosis not present

## 2016-09-15 DIAGNOSIS — I2510 Atherosclerotic heart disease of native coronary artery without angina pectoris: Secondary | ICD-10-CM

## 2016-09-15 DIAGNOSIS — Z8349 Family history of other endocrine, nutritional and metabolic diseases: Secondary | ICD-10-CM | POA: Diagnosis not present

## 2016-09-15 DIAGNOSIS — E8779 Other fluid overload: Secondary | ICD-10-CM | POA: Diagnosis not present

## 2016-09-15 DIAGNOSIS — Z9689 Presence of other specified functional implants: Secondary | ICD-10-CM

## 2016-09-15 DIAGNOSIS — D62 Acute posthemorrhagic anemia: Secondary | ICD-10-CM | POA: Diagnosis not present

## 2016-09-15 DIAGNOSIS — H409 Unspecified glaucoma: Secondary | ICD-10-CM | POA: Diagnosis present

## 2016-09-15 DIAGNOSIS — I371 Nonrheumatic pulmonary valve insufficiency: Secondary | ICD-10-CM | POA: Diagnosis not present

## 2016-09-15 DIAGNOSIS — D6959 Other secondary thrombocytopenia: Secondary | ICD-10-CM | POA: Diagnosis not present

## 2016-09-15 DIAGNOSIS — Z452 Encounter for adjustment and management of vascular access device: Secondary | ICD-10-CM | POA: Diagnosis not present

## 2016-09-15 DIAGNOSIS — R0902 Hypoxemia: Secondary | ICD-10-CM | POA: Diagnosis not present

## 2016-09-15 HISTORY — PX: TEE WITHOUT CARDIOVERSION: SHX5443

## 2016-09-15 HISTORY — PX: ASCENDING AORTIC ROOT REPLACEMENT: SHX5729

## 2016-09-15 HISTORY — DX: Disease of blood and blood-forming organs, unspecified: D75.9

## 2016-09-15 HISTORY — PX: AORTIC VALVE REPLACEMENT: SHX41

## 2016-09-15 LAB — GLUCOSE, CAPILLARY
Glucose-Capillary: 190 mg/dL — ABNORMAL HIGH (ref 65–99)
Glucose-Capillary: 203 mg/dL — ABNORMAL HIGH (ref 65–99)
Glucose-Capillary: 200 mg/dL — ABNORMAL HIGH (ref 65–99)
Glucose-Capillary: 214 mg/dL — ABNORMAL HIGH (ref 65–99)
Glucose-Capillary: 262 mg/dL — ABNORMAL HIGH (ref 65–99)
Glucose-Capillary: 261 mg/dL — ABNORMAL HIGH (ref 65–99)

## 2016-09-15 LAB — POCT I-STAT, CHEM 8
BUN: 14 mg/dL (ref 6–20)
BUN: 13 mg/dL (ref 6–20)
BUN: 13 mg/dL (ref 6–20)
BUN: 13 mg/dL (ref 6–20)
BUN: 13 mg/dL (ref 6–20)
BUN: 12 mg/dL (ref 6–20)
BUN: 11 mg/dL (ref 6–20)
BUN: 11 mg/dL (ref 6–20)
BUN: 12 mg/dL (ref 6–20)
Calcium, Ion: 1.19 mmol/L (ref 1.15–1.40)
Calcium, Ion: 1.18 mmol/L (ref 1.15–1.40)
Calcium, Ion: 0.95 mmol/L — ABNORMAL LOW (ref 1.15–1.40)
Calcium, Ion: 1.01 mmol/L — ABNORMAL LOW (ref 1.15–1.40)
Calcium, Ion: 1.02 mmol/L — ABNORMAL LOW (ref 1.15–1.40)
Calcium, Ion: 0.98 mmol/L — ABNORMAL LOW (ref 1.15–1.40)
Calcium, Ion: 0.91 mmol/L — ABNORMAL LOW (ref 1.15–1.40)
Calcium, Ion: 0.8 mmol/L — CL (ref 1.15–1.40)
Calcium, Ion: 0.93 mmol/L — ABNORMAL LOW (ref 1.15–1.40)
Chloride: 101 mmol/L (ref 101–111)
Chloride: 103 mmol/L (ref 101–111)
Chloride: 100 mmol/L — ABNORMAL LOW (ref 101–111)
Chloride: 100 mmol/L — ABNORMAL LOW (ref 101–111)
Chloride: 100 mmol/L — ABNORMAL LOW (ref 101–111)
Chloride: 100 mmol/L — ABNORMAL LOW (ref 101–111)
Chloride: 103 mmol/L (ref 101–111)
Chloride: 101 mmol/L (ref 101–111)
Chloride: 102 mmol/L (ref 101–111)
Creatinine, Ser: 0.7 mg/dL (ref 0.61–1.24)
Creatinine, Ser: 0.7 mg/dL (ref 0.61–1.24)
Creatinine, Ser: 0.6 mg/dL — ABNORMAL LOW (ref 0.61–1.24)
Creatinine, Ser: 0.6 mg/dL — ABNORMAL LOW (ref 0.61–1.24)
Creatinine, Ser: 0.6 mg/dL — ABNORMAL LOW (ref 0.61–1.24)
Creatinine, Ser: 0.5 mg/dL — ABNORMAL LOW (ref 0.61–1.24)
Creatinine, Ser: 0.6 mg/dL — ABNORMAL LOW (ref 0.61–1.24)
Creatinine, Ser: 0.5 mg/dL — ABNORMAL LOW (ref 0.61–1.24)
Creatinine, Ser: 0.7 mg/dL (ref 0.61–1.24)
Glucose, Bld: 101 mg/dL — ABNORMAL HIGH (ref 65–99)
Glucose, Bld: 102 mg/dL — ABNORMAL HIGH (ref 65–99)
Glucose, Bld: 107 mg/dL — ABNORMAL HIGH (ref 65–99)
Glucose, Bld: 128 mg/dL — ABNORMAL HIGH (ref 65–99)
Glucose, Bld: 151 mg/dL — ABNORMAL HIGH (ref 65–99)
Glucose, Bld: 178 mg/dL — ABNORMAL HIGH (ref 65–99)
Glucose, Bld: 202 mg/dL — ABNORMAL HIGH (ref 65–99)
Glucose, Bld: 168 mg/dL — ABNORMAL HIGH (ref 65–99)
Glucose, Bld: 220 mg/dL — ABNORMAL HIGH (ref 65–99)
HCT: 31 % — ABNORMAL LOW (ref 39.0–52.0)
HCT: 27 % — ABNORMAL LOW (ref 39.0–52.0)
HCT: 24 % — ABNORMAL LOW (ref 39.0–52.0)
HCT: 26 % — ABNORMAL LOW (ref 39.0–52.0)
HCT: 29 % — ABNORMAL LOW (ref 39.0–52.0)
HCT: 26 % — ABNORMAL LOW (ref 39.0–52.0)
HCT: 29 % — ABNORMAL LOW (ref 39.0–52.0)
HCT: 36 % — ABNORMAL LOW (ref 39.0–52.0)
HCT: 25 % — ABNORMAL LOW (ref 39.0–52.0)
Hemoglobin: 10.5 g/dL — ABNORMAL LOW (ref 13.0–17.0)
Hemoglobin: 9.2 g/dL — ABNORMAL LOW (ref 13.0–17.0)
Hemoglobin: 8.2 g/dL — ABNORMAL LOW (ref 13.0–17.0)
Hemoglobin: 8.8 g/dL — ABNORMAL LOW (ref 13.0–17.0)
Hemoglobin: 9.9 g/dL — ABNORMAL LOW (ref 13.0–17.0)
Hemoglobin: 8.8 g/dL — ABNORMAL LOW (ref 13.0–17.0)
Hemoglobin: 9.9 g/dL — ABNORMAL LOW (ref 13.0–17.0)
Hemoglobin: 12.2 g/dL — ABNORMAL LOW (ref 13.0–17.0)
Hemoglobin: 8.5 g/dL — ABNORMAL LOW (ref 13.0–17.0)
Potassium: 3.6 mmol/L (ref 3.5–5.1)
Potassium: 3.5 mmol/L (ref 3.5–5.1)
Potassium: 3.9 mmol/L (ref 3.5–5.1)
Potassium: 4.1 mmol/L (ref 3.5–5.1)
Potassium: 4.2 mmol/L (ref 3.5–5.1)
Potassium: 4.1 mmol/L (ref 3.5–5.1)
Potassium: 3.5 mmol/L (ref 3.5–5.1)
Potassium: 3.4 mmol/L — ABNORMAL LOW (ref 3.5–5.1)
Potassium: 3.6 mmol/L (ref 3.5–5.1)
Sodium: 140 mmol/L (ref 135–145)
Sodium: 140 mmol/L (ref 135–145)
Sodium: 140 mmol/L (ref 135–145)
Sodium: 137 mmol/L (ref 135–145)
Sodium: 137 mmol/L (ref 135–145)
Sodium: 137 mmol/L (ref 135–145)
Sodium: 140 mmol/L (ref 135–145)
Sodium: 142 mmol/L (ref 135–145)
Sodium: 141 mmol/L (ref 135–145)
TCO2: 29 mmol/L (ref 0–100)
TCO2: 26 mmol/L (ref 0–100)
TCO2: 28 mmol/L (ref 0–100)
TCO2: 27 mmol/L (ref 0–100)
TCO2: 28 mmol/L (ref 0–100)
TCO2: 29 mmol/L (ref 0–100)
TCO2: 24 mmol/L (ref 0–100)
TCO2: 26 mmol/L (ref 0–100)
TCO2: 23 mmol/L (ref 0–100)

## 2016-09-15 LAB — PREPARE RBC (CROSSMATCH)

## 2016-09-15 LAB — CBC
HCT: 24.8 % — ABNORMAL LOW (ref 39.0–52.0)
HEMATOCRIT: 36.2 % — AB (ref 39.0–52.0)
Hemoglobin: 12.6 g/dL — ABNORMAL LOW (ref 13.0–17.0)
Hemoglobin: 8.6 g/dL — ABNORMAL LOW (ref 13.0–17.0)
MCH: 30.4 pg (ref 26.0–34.0)
MCH: 30.3 pg (ref 26.0–34.0)
MCHC: 34.8 g/dL (ref 30.0–36.0)
MCHC: 34.7 g/dL (ref 30.0–36.0)
MCV: 87.4 fL (ref 78.0–100.0)
MCV: 87.3 fL (ref 78.0–100.0)
Platelets: 74 10*3/uL — ABNORMAL LOW (ref 150–400)
Platelets: 110 10*3/uL — ABNORMAL LOW (ref 150–400)
RBC: 4.14 MIL/uL — ABNORMAL LOW (ref 4.22–5.81)
RBC: 2.84 MIL/uL — ABNORMAL LOW (ref 4.22–5.81)
RDW: 13.7 % (ref 11.5–15.5)
RDW: 13.9 % (ref 11.5–15.5)
WBC: 6.6 10*3/uL (ref 4.0–10.5)
WBC: 14.3 10*3/uL — ABNORMAL HIGH (ref 4.0–10.5)

## 2016-09-15 LAB — POCT I-STAT 3, ART BLOOD GAS (G3+)
Acid-Base Excess: 7 mmol/L — ABNORMAL HIGH (ref 0.0–2.0)
Acid-Base Excess: 4 mmol/L — ABNORMAL HIGH (ref 0.0–2.0)
Acid-base deficit: 1 mmol/L (ref 0.0–2.0)
Acid-base deficit: 1 mmol/L (ref 0.0–2.0)
Acid-base deficit: 3 mmol/L — ABNORMAL HIGH (ref 0.0–2.0)
Bicarbonate: 30.6 mmol/L — ABNORMAL HIGH (ref 20.0–28.0)
Bicarbonate: 23.2 mmol/L (ref 20.0–28.0)
Bicarbonate: 28.2 mmol/L — ABNORMAL HIGH (ref 20.0–28.0)
Bicarbonate: 23.7 mmol/L (ref 20.0–28.0)
Bicarbonate: 23 mmol/L (ref 20.0–28.0)
O2 Saturation: 100 %
O2 Saturation: 98 %
O2 Saturation: 100 %
O2 Saturation: 94 %
O2 Saturation: 99 %
Patient temperature: 35.9
Patient temperature: 35.5
TCO2: 32 mmol/L (ref 0–100)
TCO2: 24 mmol/L (ref 0–100)
TCO2: 30 mmol/L (ref 0–100)
TCO2: 25 mmol/L (ref 0–100)
TCO2: 24 mmol/L (ref 0–100)
pCO2 arterial: 38.3 mmHg (ref 32.0–48.0)
pCO2 arterial: 36.4 mmHg (ref 32.0–48.0)
pCO2 arterial: 42.3 mmHg (ref 32.0–48.0)
pCO2 arterial: 38.4 mmHg (ref 32.0–48.0)
pCO2 arterial: 39.2 mmHg (ref 32.0–48.0)
pH, Arterial: 7.511 — ABNORMAL HIGH (ref 7.350–7.450)
pH, Arterial: 7.412 (ref 7.350–7.450)
pH, Arterial: 7.432 (ref 7.350–7.450)
pH, Arterial: 7.394 (ref 7.350–7.450)
pH, Arterial: 7.368 (ref 7.350–7.450)
pO2, Arterial: 425 mmHg — ABNORMAL HIGH (ref 83.0–108.0)
pO2, Arterial: 96 mmHg (ref 83.0–108.0)
pO2, Arterial: 205 mmHg — ABNORMAL HIGH (ref 83.0–108.0)
pO2, Arterial: 68 mmHg — ABNORMAL LOW (ref 83.0–108.0)
pO2, Arterial: 130 mmHg — ABNORMAL HIGH (ref 83.0–108.0)

## 2016-09-15 LAB — FIBRINOGEN
Fibrinogen: 193 mg/dL — ABNORMAL LOW (ref 210–475)
Fibrinogen: 240 mg/dL (ref 210–475)

## 2016-09-15 LAB — POCT I-STAT 4, (NA,K, GLUC, HGB,HCT)
Glucose, Bld: 190 mg/dL — ABNORMAL HIGH (ref 65–99)
HCT: 36 % — ABNORMAL LOW (ref 39.0–52.0)
Hemoglobin: 12.2 g/dL — ABNORMAL LOW (ref 13.0–17.0)
Potassium: 3.1 mmol/L — ABNORMAL LOW (ref 3.5–5.1)
Sodium: 142 mmol/L (ref 135–145)

## 2016-09-15 LAB — MAGNESIUM: Magnesium: 2.9 mg/dL — ABNORMAL HIGH (ref 1.7–2.4)

## 2016-09-15 LAB — HEMOGLOBIN AND HEMATOCRIT, BLOOD
HCT: 25.5 % — ABNORMAL LOW (ref 39.0–52.0)
Hemoglobin: 8.9 g/dL — ABNORMAL LOW (ref 13.0–17.0)

## 2016-09-15 LAB — PROTIME-INR
INR: 1.54
INR: 1.42
Prothrombin Time: 18.6 seconds — ABNORMAL HIGH (ref 11.4–15.2)
Prothrombin Time: 17.4 seconds — ABNORMAL HIGH (ref 11.4–15.2)

## 2016-09-15 LAB — CREATININE, SERUM
Creatinine, Ser: 0.96 mg/dL (ref 0.61–1.24)
GFR calc Af Amer: >60 mL/min (ref >60)
GFR calc non Af Amer: >60 mL/min (ref >60)

## 2016-09-15 LAB — PLATELET COUNT: Platelets: 56 10*3/uL — ABNORMAL LOW (ref 150–400)

## 2016-09-15 LAB — APTT: aPTT: 36 seconds (ref 24–36)

## 2016-09-15 SURGERY — REPLACEMENT, AORTIC VALVE, OPEN
Anesthesia: General | Site: Chest

## 2016-09-15 MED ORDER — SODIUM CHLORIDE 0.9 % IV SOLN: 1.0000 g | Freq: Once | INTRAVENOUS | Status: DC

## 2016-09-15 MED ORDER — POTASSIUM CHLORIDE 10 MEQ/50ML IV SOLN
10.0000 meq | INTRAVENOUS | Status: AC
  Administered 2016-09-15 (×3): 10 meq via INTRAVENOUS

## 2016-09-15 MED ORDER — DEXTROSE 5 % IV SOLN
1.5000 g | Freq: Two times a day (BID) | INTRAVENOUS | Status: AC
  Administered 2016-09-15 – 2016-09-17 (×4): 1.5 g via INTRAVENOUS
  Filled 2016-09-15 (×4): qty 1.5

## 2016-09-15 MED ORDER — PROTAMINE SULFATE 10 MG/ML IV SOLN
INTRAVENOUS | Status: DC | PRN
  Administered 2016-09-15 (×4): 30 mg via INTRAVENOUS
  Administered 2016-09-15: 40 mg via INTRAVENOUS
  Administered 2016-09-15 (×4): 30 mg via INTRAVENOUS

## 2016-09-15 MED ORDER — LATANOPROST 0.005 % OP SOLN
1.0000 [drp] | Freq: Every day | OPHTHALMIC | Status: DC
  Administered 2016-09-16 – 2016-09-22 (×7): 1 [drp] via OPHTHALMIC
  Filled 2016-09-15: qty 2.5

## 2016-09-15 MED ORDER — METOPROLOL TARTRATE 12.5 MG HALF TABLET: 12.5000 mg | ORAL_TABLET | Freq: Once | ORAL | Status: DC

## 2016-09-15 MED ORDER — LACTATED RINGERS IV SOLN: 500.0000 mL | Freq: Once | INTRAVENOUS | Status: DC | PRN

## 2016-09-15 MED ORDER — CALCIUM CHLORIDE 10 % IV SOLN
1.0000 g | Freq: Once | INTRAVENOUS | Status: AC
  Administered 2016-09-15: 1 g via INTRAVENOUS

## 2016-09-15 MED ORDER — SODIUM CHLORIDE 0.45 % IV SOLN
INTRAVENOUS | Status: DC | PRN
  Administered 2016-09-15 – 2016-09-16 (×2): via INTRAVENOUS

## 2016-09-15 MED ORDER — ROCURONIUM BROMIDE 10 MG/ML (PF) SYRINGE
PREFILLED_SYRINGE | INTRAVENOUS | Status: DC | PRN
  Administered 2016-09-15: 30 mg via INTRAVENOUS
  Administered 2016-09-15 (×4): 50 mg via INTRAVENOUS
  Administered 2016-09-15: 70 mg via INTRAVENOUS

## 2016-09-15 MED ORDER — NITROGLYCERIN IN D5W 200-5 MCG/ML-% IV SOLN: 0.0000 ug/min | INTRAVENOUS | Status: DC

## 2016-09-15 MED ORDER — SODIUM CHLORIDE 0.9 % IV SOLN
0.0000 ug/kg/h | INTRAVENOUS | Status: DC
  Administered 2016-09-15 (×3): 0.7 ug/kg/h via INTRAVENOUS
  Administered 2016-09-16: 0.6 ug/kg/h via INTRAVENOUS
  Administered 2016-09-16 (×2): 0.7 ug/kg/h via INTRAVENOUS
  Filled 2016-09-15 (×6): qty 2

## 2016-09-15 MED ORDER — BISACODYL 10 MG RE SUPP
10.0000 mg | Freq: Every day | RECTAL | Status: DC
  Administered 2016-09-17: 10 mg via RECTAL
  Filled 2016-09-15: qty 1

## 2016-09-15 MED ORDER — SODIUM CHLORIDE 0.9% FLUSH
3.0000 mL | Freq: Two times a day (BID) | INTRAVENOUS | Status: DC
  Administered 2016-09-16 – 2016-09-21 (×8): 3 mL via INTRAVENOUS

## 2016-09-15 MED ORDER — ASPIRIN 81 MG PO CHEW
324.0000 mg | CHEWABLE_TABLET | Freq: Every day | ORAL | Status: DC
  Administered 2016-09-16 – 2016-09-17 (×2): 324 mg
  Filled 2016-09-15 (×2): qty 4

## 2016-09-15 MED ORDER — ALBUMIN HUMAN 5 % IV SOLN
250.0000 mL | INTRAVENOUS | Status: AC | PRN
  Administered 2016-09-15 – 2016-09-16 (×2): 250 mL via INTRAVENOUS

## 2016-09-15 MED ORDER — ONDANSETRON HCL 4 MG/2ML IJ SOLN: 4.0000 mg | Freq: Four times a day (QID) | INTRAMUSCULAR | Status: DC | PRN

## 2016-09-15 MED ORDER — ROCURONIUM BROMIDE 10 MG/ML (PF) SYRINGE
PREFILLED_SYRINGE | INTRAVENOUS | Status: AC
  Filled 2016-09-15: qty 5

## 2016-09-15 MED ORDER — METOPROLOL TARTRATE 25 MG/10 ML ORAL SUSPENSION: 12.5000 mg | Freq: Two times a day (BID) | ORAL | Status: DC

## 2016-09-15 MED ORDER — SODIUM CHLORIDE 0.9 % IV SOLN
Freq: Once | INTRAVENOUS | Status: AC
  Administered 2016-09-15: 10 mL/h via INTRAVENOUS

## 2016-09-15 MED ORDER — MIDAZOLAM HCL 2 MG/2ML IJ SOLN
2.0000 mg | INTRAMUSCULAR | Status: DC | PRN
  Administered 2016-09-15 – 2016-09-16 (×6): 2 mg via INTRAVENOUS
  Filled 2016-09-15 (×7): qty 2

## 2016-09-15 MED ORDER — DOPAMINE-DEXTROSE 3.2-5 MG/ML-% IV SOLN
2.5000 ug/kg/min | INTRAVENOUS | Status: DC
  Administered 2016-09-17: 3 ug/kg/min via INTRAVENOUS
  Administered 2016-09-18: 2.5 ug/kg/min via INTRAVENOUS
  Filled 2016-09-15 (×2): qty 250

## 2016-09-15 MED ORDER — FENTANYL CITRATE (PF) 250 MCG/5ML IJ SOLN
INTRAMUSCULAR | Status: AC
  Filled 2016-09-15: qty 20

## 2016-09-15 MED ORDER — FENTANYL CITRATE (PF) 100 MCG/2ML IJ SOLN
INTRAMUSCULAR | Status: DC | PRN
  Administered 2016-09-15: 50 ug via INTRAVENOUS
  Administered 2016-09-15: 250 ug via INTRAVENOUS
  Administered 2016-09-15: 150 ug via INTRAVENOUS
  Administered 2016-09-15: 100 ug via INTRAVENOUS
  Administered 2016-09-15: 150 ug via INTRAVENOUS
  Administered 2016-09-15: 500 ug via INTRAVENOUS
  Administered 2016-09-15: 50 ug via INTRAVENOUS

## 2016-09-15 MED ORDER — PROPOFOL 10 MG/ML IV BOLUS
INTRAVENOUS | Status: DC | PRN
  Administered 2016-09-15: 50 mg via INTRAVENOUS

## 2016-09-15 MED ORDER — PROTAMINE SULFATE 10 MG/ML IV SOLN
INTRAVENOUS | Status: AC
  Filled 2016-09-15: qty 25

## 2016-09-15 MED ORDER — CHLORHEXIDINE GLUCONATE 0.12% ORAL RINSE (MEDLINE KIT)
15.0000 mL | Freq: Two times a day (BID) | OROMUCOSAL | Status: DC
  Administered 2016-09-15 – 2016-09-18 (×5): 15 mL via OROMUCOSAL

## 2016-09-15 MED ORDER — LACTATED RINGERS IV SOLN
INTRAVENOUS | Status: DC | PRN
  Administered 2016-09-15: 07:00:00 via INTRAVENOUS

## 2016-09-15 MED ORDER — CALCIUM CHLORIDE 10 % IV SOLN
INTRAVENOUS | Status: DC | PRN
  Administered 2016-09-15: 100 mg via INTRAVENOUS
  Administered 2016-09-15: 200 mg via INTRAVENOUS

## 2016-09-15 MED ORDER — MIDAZOLAM HCL 10 MG/2ML IJ SOLN
INTRAMUSCULAR | Status: AC
  Filled 2016-09-15: qty 2

## 2016-09-15 MED ORDER — THROMBIN 5000 UNITS EX SOLR
CUTANEOUS | Status: AC
  Filled 2016-09-15: qty 5000

## 2016-09-15 MED ORDER — HEMOSTATIC AGENTS (NO CHARGE) OPTIME
TOPICAL | Status: DC | PRN
  Administered 2016-09-15 (×7): 1 via TOPICAL

## 2016-09-15 MED ORDER — ARTIFICIAL TEARS OPHTHALMIC OINT
TOPICAL_OINTMENT | OPHTHALMIC | Status: DC | PRN
  Administered 2016-09-15: 1 via OPHTHALMIC

## 2016-09-15 MED ORDER — PROTAMINE SULFATE 10 MG/ML IV SOLN
25.0000 mg | Freq: Once | INTRAVENOUS | Status: AC
  Administered 2016-09-15: 25 mg via INTRAVENOUS
  Filled 2016-09-15: qty 5

## 2016-09-15 MED ORDER — SODIUM CHLORIDE 0.9 % IJ SOLN
OROMUCOSAL | Status: DC | PRN
  Administered 2016-09-15 (×3): 4 mL via TOPICAL

## 2016-09-15 MED ORDER — BISACODYL 5 MG PO TBEC
10.0000 mg | DELAYED_RELEASE_TABLET | Freq: Every day | ORAL | Status: DC
  Administered 2016-09-18 – 2016-09-22 (×2): 10 mg via ORAL
  Filled 2016-09-15 (×5): qty 2

## 2016-09-15 MED ORDER — LACTATED RINGERS IV SOLN
INTRAVENOUS | Status: DC | PRN
  Administered 2016-09-15 (×2): via INTRAVENOUS

## 2016-09-15 MED ORDER — PROPOFOL 10 MG/ML IV BOLUS
INTRAVENOUS | Status: AC
  Filled 2016-09-15: qty 20

## 2016-09-15 MED ORDER — NOREPINEPHRINE BITARTRATE 1 MG/ML IV SOLN: 0.0000 ug/min | INTRAVENOUS | Status: DC

## 2016-09-15 MED ORDER — SODIUM CHLORIDE 0.9 % IV SOLN: 250.0000 mL | INTRAVENOUS | Status: DC

## 2016-09-15 MED ORDER — SUCCINYLCHOLINE CHLORIDE 200 MG/10ML IV SOSY
PREFILLED_SYRINGE | INTRAVENOUS | Status: AC
  Filled 2016-09-15: qty 10

## 2016-09-15 MED ORDER — ALBUMIN HUMAN 5 % IV SOLN
INTRAVENOUS | Status: DC | PRN
  Administered 2016-09-15 (×2): via INTRAVENOUS

## 2016-09-15 MED ORDER — SODIUM CHLORIDE 0.9 % IV SOLN: Freq: Once | INTRAVENOUS | Status: DC

## 2016-09-15 MED ORDER — OXYCODONE HCL 5 MG PO TABS: 5.0000 mg | ORAL_TABLET | ORAL | Status: DC | PRN

## 2016-09-15 MED ORDER — 0.9 % SODIUM CHLORIDE (POUR BTL) OPTIME
TOPICAL | Status: DC | PRN
  Administered 2016-09-15: 6000 mL

## 2016-09-15 MED ORDER — CHLORHEXIDINE GLUCONATE 0.12 % MT SOLN: 15.0000 mL | OROMUCOSAL | Status: AC

## 2016-09-15 MED ORDER — HEPARIN SODIUM (PORCINE) 1000 UNIT/ML IJ SOLN
INTRAMUSCULAR | Status: AC
  Filled 2016-09-15: qty 1

## 2016-09-15 MED ORDER — FENTANYL CITRATE (PF) 250 MCG/5ML IJ SOLN
INTRAMUSCULAR | Status: AC
  Filled 2016-09-15: qty 5

## 2016-09-15 MED ORDER — HEPARIN SODIUM (PORCINE) 1000 UNIT/ML IJ SOLN
INTRAMUSCULAR | Status: DC | PRN
  Administered 2016-09-15: 28000 [IU] via INTRAVENOUS

## 2016-09-15 MED ORDER — MAGNESIUM SULFATE 4 GM/100ML IV SOLN
4.0000 g | Freq: Once | INTRAVENOUS | Status: AC
  Administered 2016-09-15: 4 g via INTRAVENOUS
  Filled 2016-09-15: qty 100

## 2016-09-15 MED ORDER — ACETAMINOPHEN 500 MG PO TABS
1000.0000 mg | ORAL_TABLET | Freq: Four times a day (QID) | ORAL | Status: AC
  Administered 2016-09-17 – 2016-09-20 (×11): 1000 mg via ORAL
  Filled 2016-09-15 (×10): qty 2

## 2016-09-15 MED ORDER — INSULIN REGULAR BOLUS VIA INFUSION
0.0000 [IU] | Freq: Three times a day (TID) | INTRAVENOUS | Status: DC
  Filled 2016-09-15: qty 10

## 2016-09-15 MED ORDER — METOPROLOL TARTRATE 12.5 MG HALF TABLET
12.5000 mg | ORAL_TABLET | Freq: Two times a day (BID) | ORAL | Status: DC
  Administered 2016-09-17 – 2016-09-20 (×5): 12.5 mg via ORAL
  Filled 2016-09-15 (×5): qty 1

## 2016-09-15 MED ORDER — DEXTROSE 5 % IV SOLN: 2.0000 ug/min | INTRAVENOUS | Status: DC

## 2016-09-15 MED ORDER — MORPHINE SULFATE (PF) 4 MG/ML IV SOLN: 1.0000 mg | INTRAVENOUS | Status: AC | PRN

## 2016-09-15 MED ORDER — EPHEDRINE 5 MG/ML INJ
INTRAVENOUS | Status: AC
  Filled 2016-09-15: qty 10

## 2016-09-15 MED ORDER — PROTAMINE SULFATE 10 MG/ML IV SOLN
INTRAVENOUS | Status: AC
  Filled 2016-09-15: qty 5

## 2016-09-15 MED ORDER — CHLORHEXIDINE GLUCONATE 0.12 % MT SOLN
15.0000 mL | Freq: Once | OROMUCOSAL | Status: AC
  Administered 2016-09-15: 15 mL via OROMUCOSAL
  Filled 2016-09-15: qty 15

## 2016-09-15 MED ORDER — MORPHINE SULFATE (PF) 2 MG/ML IV SOLN: 1.0000 mg | INTRAVENOUS | Status: DC | PRN

## 2016-09-15 MED ORDER — SODIUM CHLORIDE 0.9 % IV SOLN
INTRAVENOUS | Status: DC
  Administered 2016-09-16: 22:00:00 via INTRAVENOUS

## 2016-09-15 MED ORDER — PANTOPRAZOLE SODIUM 40 MG PO TBEC
40.0000 mg | DELAYED_RELEASE_TABLET | Freq: Every day | ORAL | Status: DC
  Administered 2016-09-18 – 2016-09-23 (×6): 40 mg via ORAL
  Filled 2016-09-15 (×6): qty 1

## 2016-09-15 MED ORDER — ACETAMINOPHEN 160 MG/5ML PO SOLN
1000.0000 mg | Freq: Four times a day (QID) | ORAL | Status: DC
  Administered 2016-09-16 – 2016-09-17 (×6): 1000 mg
  Filled 2016-09-15 (×6): qty 40.6

## 2016-09-15 MED ORDER — NOREPINEPHRINE BITARTRATE 1 MG/ML IV SOLN
0.0000 ug/min | INTRAVENOUS | Status: AC
  Administered 2016-09-15: 5 ug/min via INTRAVENOUS
  Filled 2016-09-15: qty 4

## 2016-09-15 MED ORDER — NOREPINEPHRINE BITARTRATE 1 MG/ML IV SOLN
0.0000 ug/min | INTRAVENOUS | Status: DC
  Administered 2016-09-15: 14 ug/min via INTRAVENOUS
  Administered 2016-09-16: 28 ug/min via INTRAVENOUS
  Administered 2016-09-16: 26 ug/min via INTRAVENOUS
  Administered 2016-09-16: 22 ug/min via INTRAVENOUS
  Filled 2016-09-15 (×7): qty 4

## 2016-09-15 MED ORDER — MORPHINE SULFATE (PF) 2 MG/ML IV SOLN: 2.0000 mg | INTRAVENOUS | Status: DC | PRN

## 2016-09-15 MED ORDER — PHENYLEPHRINE HCL 10 MG/ML IJ SOLN
0.0000 ug/min | INTRAMUSCULAR | Status: DC
  Filled 2016-09-15: qty 2

## 2016-09-15 MED ORDER — CALCIUM CHLORIDE 10 % IV SOLN
INTRAVENOUS | Status: AC
  Filled 2016-09-15: qty 10

## 2016-09-15 MED ORDER — LIDOCAINE 2% (20 MG/ML) 5 ML SYRINGE
INTRAMUSCULAR | Status: AC
  Filled 2016-09-15: qty 5

## 2016-09-15 MED ORDER — ASPIRIN EC 325 MG PO TBEC
325.0000 mg | DELAYED_RELEASE_TABLET | Freq: Every day | ORAL | Status: DC
  Filled 2016-09-15: qty 1

## 2016-09-15 MED ORDER — THROMBIN 5000 UNITS EX SOLR
CUTANEOUS | Status: DC | PRN
  Administered 2016-09-15: 5000 [IU] via TOPICAL

## 2016-09-15 MED ORDER — FAMOTIDINE IN NACL 20-0.9 MG/50ML-% IV SOLN
20.0000 mg | Freq: Two times a day (BID) | INTRAVENOUS | Status: AC
  Administered 2016-09-15 – 2016-09-16 (×2): 20 mg via INTRAVENOUS
  Filled 2016-09-15: qty 50

## 2016-09-15 MED ORDER — PHENYLEPHRINE 40 MCG/ML (10ML) SYRINGE FOR IV PUSH (FOR BLOOD PRESSURE SUPPORT)
PREFILLED_SYRINGE | INTRAVENOUS | Status: AC
  Filled 2016-09-15: qty 10

## 2016-09-15 MED ORDER — VANCOMYCIN HCL IN DEXTROSE 1-5 GM/200ML-% IV SOLN
1000.0000 mg | Freq: Once | INTRAVENOUS | Status: AC
  Administered 2016-09-15: 1000 mg via INTRAVENOUS
  Filled 2016-09-15: qty 200

## 2016-09-15 MED ORDER — MIDAZOLAM HCL 5 MG/5ML IJ SOLN
INTRAMUSCULAR | Status: DC | PRN
  Administered 2016-09-15: 1 mg via INTRAVENOUS
  Administered 2016-09-15: 2 mg via INTRAVENOUS
  Administered 2016-09-15: 1 mg via INTRAVENOUS
  Administered 2016-09-15: 2 mg via INTRAVENOUS
  Administered 2016-09-15: 1 mg via INTRAVENOUS
  Administered 2016-09-15: 3 mg via INTRAVENOUS

## 2016-09-15 MED ORDER — LACTATED RINGERS IV SOLN: INTRAVENOUS | Status: DC

## 2016-09-15 MED ORDER — TRAMADOL HCL 50 MG PO TABS: 50.0000 mg | ORAL_TABLET | ORAL | Status: DC | PRN

## 2016-09-15 MED ORDER — METOPROLOL TARTRATE 5 MG/5ML IV SOLN
2.5000 mg | INTRAVENOUS | Status: DC | PRN
  Administered 2016-09-18: 5 mg via INTRAVENOUS
  Filled 2016-09-15: qty 5

## 2016-09-15 MED ORDER — ACETAMINOPHEN 160 MG/5ML PO SOLN: 650.0000 mg | Freq: Once | ORAL | Status: AC

## 2016-09-15 MED ORDER — ACETAMINOPHEN 650 MG RE SUPP
650.0000 mg | Freq: Once | RECTAL | Status: AC
  Administered 2016-09-15: 650 mg via RECTAL

## 2016-09-15 MED ORDER — SODIUM CHLORIDE 0.9% FLUSH: 3.0000 mL | INTRAVENOUS | Status: DC | PRN

## 2016-09-15 MED ORDER — DEXTROSE 5 % IV SOLN
1.0000 ug/min | INTRAVENOUS | Status: DC
  Administered 2016-09-16: 6 ug/min via INTRAVENOUS
  Filled 2016-09-15 (×2): qty 4

## 2016-09-15 MED ORDER — CHLORHEXIDINE GLUCONATE 4 % EX LIQD: 30.0000 mL | CUTANEOUS | Status: DC

## 2016-09-15 MED ORDER — DOPAMINE-DEXTROSE 3.2-5 MG/ML-% IV SOLN: 3.0000 ug/kg/min | INTRAVENOUS | Status: DC

## 2016-09-15 MED ORDER — SODIUM CHLORIDE 0.9 % IV SOLN
INTRAVENOUS | Status: DC | PRN
  Administered 2016-09-15: 14:00:00 via INTRAVENOUS

## 2016-09-15 MED ORDER — DOCUSATE SODIUM 100 MG PO CAPS
200.0000 mg | ORAL_CAPSULE | Freq: Every day | ORAL | Status: DC
  Administered 2016-09-17 – 2016-09-23 (×5): 200 mg via ORAL
  Filled 2016-09-15 (×7): qty 2

## 2016-09-15 MED ORDER — ARTIFICIAL TEARS OPHTHALMIC OINT
TOPICAL_OINTMENT | OPHTHALMIC | Status: AC
  Filled 2016-09-15: qty 3.5

## 2016-09-15 MED ORDER — POTASSIUM CHLORIDE 10 MEQ/50ML IV SOLN
10.0000 meq | INTRAVENOUS | Status: AC
  Administered 2016-09-15 (×3): 10 meq via INTRAVENOUS
  Filled 2016-09-15: qty 50

## 2016-09-15 MED ORDER — SODIUM CHLORIDE 0.9 % IV SOLN
INTRAVENOUS | Status: DC
  Administered 2016-09-15: 22:00:00 via INTRAVENOUS
  Administered 2016-09-16: 4 [IU]/h via INTRAVENOUS
  Administered 2016-09-16: 20.5 [IU]/h via INTRAVENOUS
  Administered 2016-09-16: 02:00:00 via INTRAVENOUS
  Administered 2016-09-16: 18.6 [IU]/h via INTRAVENOUS
  Filled 2016-09-15 (×4): qty 1

## 2016-09-15 MED ORDER — DEXMEDETOMIDINE HCL IN NACL 200 MCG/50ML IV SOLN
INTRAVENOUS | Status: AC
  Filled 2016-09-15: qty 50

## 2016-09-15 MED ORDER — PHENYLEPHRINE HCL 10 MG/ML IJ SOLN
INTRAVENOUS | Status: DC | PRN
  Administered 2016-09-15: 20 ug/min via INTRAVENOUS

## 2016-09-15 MED ORDER — LACTATED RINGERS IV SOLN
INTRAVENOUS | Status: DC
  Administered 2016-09-18: 01:00:00 via INTRAVENOUS

## 2016-09-15 MED ORDER — MORPHINE SULFATE (PF) 4 MG/ML IV SOLN
2.0000 mg | INTRAVENOUS | Status: DC | PRN
  Administered 2016-09-15 – 2016-09-16 (×6): 4 mg via INTRAVENOUS
  Filled 2016-09-15 (×7): qty 1

## 2016-09-15 MED ORDER — ORAL CARE MOUTH RINSE
15.0000 mL | OROMUCOSAL | Status: DC
  Administered 2016-09-15 – 2016-09-18 (×25): 15 mL via OROMUCOSAL

## 2016-09-15 SURGICAL SUPPLY — 122 items
ADAPTER CARDIO PERF ANTE/RETRO (ADAPTER) ×3 IMPLANT
ADPR PRFSN 84XANTGRD RTRGD (ADAPTER) ×2
AGENT HMST MTR 8 SURGIFLO (HEMOSTASIS) ×2
APL SRG 7X2 LUM MLBL SLNT (VASCULAR PRODUCTS) ×4
APPLICATOR COTTON TIP 6IN STRL (MISCELLANEOUS) ×1 IMPLANT
APPLICATOR TIP COSEAL (VASCULAR PRODUCTS) ×2 IMPLANT
BAG DECANTER FOR FLEXI CONT (MISCELLANEOUS) ×3 IMPLANT
BANDAGE ACE 4X5 VEL STRL LF (GAUZE/BANDAGES/DRESSINGS) ×2 IMPLANT
BANDAGE ACE 6X5 VEL STRL LF (GAUZE/BANDAGES/DRESSINGS) ×2 IMPLANT
BLADE STERNUM SYSTEM 6 (BLADE) ×3 IMPLANT
BLADE SURG 15 STRL LF DISP TIS (BLADE) ×2 IMPLANT
BLADE SURG 15 STRL SS (BLADE) ×3
BNDG GAUZE ELAST 4 BULKY (GAUZE/BANDAGES/DRESSINGS) ×2 IMPLANT
BOOT SUTURE AID YELLOW STND (SUTURE) IMPLANT
CANISTER SUCT 3000ML PPV (MISCELLANEOUS) ×3 IMPLANT
CANN PRFSN .5XCNCT 15X34-48 (MISCELLANEOUS) ×2
CANNULA GUNDRY RCSP 15FR (MISCELLANEOUS) ×3 IMPLANT
CANNULA PRFSN .5XCNCT 15X34-48 (MISCELLANEOUS) ×2 IMPLANT
CANNULA VEN 2 STAGE (MISCELLANEOUS) ×3
CATH CPB KIT GERHARDT (MISCELLANEOUS) ×3 IMPLANT
CATH HEART VENT LEFT (CATHETERS) ×2 IMPLANT
CATH RETROPLEGIA CORONARY 14FR (CATHETERS) IMPLANT
CATH THORACIC 28FR (CATHETERS) ×3 IMPLANT
CATH/SQUID NICHOLS JEHLE COR (CATHETERS) ×1 IMPLANT
CAUTERY SURG HI TEMP FINE TIP (MISCELLANEOUS) ×1 IMPLANT
CONN ST 1/4X3/8  BEN (MISCELLANEOUS) ×1
CONN ST 1/4X3/8 BEN (MISCELLANEOUS) IMPLANT
CONT SPEC 4OZ CLIKSEAL STRL BL (MISCELLANEOUS) ×2 IMPLANT
COVER MAYO STAND STRL (DRAPES) ×1 IMPLANT
CRADLE DONUT ADULT HEAD (MISCELLANEOUS) ×3 IMPLANT
DRAIN CHANNEL 28F RND 3/8 FF (WOUND CARE) ×4 IMPLANT
DRAIN CHANNEL 32F RND 10.7 FF (WOUND CARE) ×3 IMPLANT
DRAPE CARDIOVASCULAR INCISE (DRAPES) ×3
DRAPE SLUSH/WARMER DISC (DRAPES) ×3 IMPLANT
DRAPE SRG 135X102X78XABS (DRAPES) ×2 IMPLANT
DRSG AQUACEL AG ADV 3.5X14 (GAUZE/BANDAGES/DRESSINGS) ×3 IMPLANT
ELECT BLADE 4.0 EZ CLEAN MEGAD (MISCELLANEOUS) ×3
ELECT CAUTERY BLADE 6.4 (BLADE) ×3 IMPLANT
ELECT REM PT RETURN 9FT ADLT (ELECTROSURGICAL) ×6
ELECTRODE BLDE 4.0 EZ CLN MEGD (MISCELLANEOUS) ×2 IMPLANT
ELECTRODE REM PT RTRN 9FT ADLT (ELECTROSURGICAL) ×4 IMPLANT
FELT TEFLON 1X6 (MISCELLANEOUS) ×7 IMPLANT
GAUZE SPONGE 4X4 12PLY STRL (GAUZE/BANDAGES/DRESSINGS) ×5 IMPLANT
GLOVE BIO SURGEON STRL SZ 6.5 (GLOVE) ×10 IMPLANT
GOWN STRL REUS W/ TWL LRG LVL3 (GOWN DISPOSABLE) ×8 IMPLANT
GOWN STRL REUS W/TWL LRG LVL3 (GOWN DISPOSABLE) ×24
GRAFT GELWEAVE VALSALVA 32 (Prosthesis & Implant Heart) IMPLANT
GRAFT GELWEAVE VALSALVA 32CM (Prosthesis & Implant Heart) ×3 IMPLANT
HEMOSTAT POWDER SURGIFOAM 1G (HEMOSTASIS) ×9 IMPLANT
HEMOSTAT SURGICEL 2X14 (HEMOSTASIS) ×5 IMPLANT
INSERT FOGARTY XLG (MISCELLANEOUS) ×1 IMPLANT
IV CATH 18G X1.75 CATHLON (IV SOLUTION) ×1 IMPLANT
KIT BASIN OR (CUSTOM PROCEDURE TRAY) ×3 IMPLANT
KIT CATH SUCT 8FR (CATHETERS) ×3 IMPLANT
KIT ROOM TURNOVER OR (KITS) ×3 IMPLANT
KIT SUCTION CATH 14FR (SUCTIONS) ×7 IMPLANT
KIT VASOVIEW HEMOPRO VH 3000 (KITS) ×2 IMPLANT
LEAD PACING MYOCARDI (MISCELLANEOUS) ×3 IMPLANT
LINE VENT (MISCELLANEOUS) ×1 IMPLANT
LOOP VESSEL SUPERMAXI WHITE (MISCELLANEOUS) ×1 IMPLANT
MARKER GRAFT CORONARY BYPASS (MISCELLANEOUS) ×9 IMPLANT
NS IRRIG 1000ML POUR BTL (IV SOLUTION) ×16 IMPLANT
PACK OPEN HEART (CUSTOM PROCEDURE TRAY) ×3 IMPLANT
PAD ARMBOARD 7.5X6 YLW CONV (MISCELLANEOUS) ×6 IMPLANT
PAD ELECT DEFIB RADIOL ZOLL (MISCELLANEOUS) ×3 IMPLANT
PENCIL BUTTON HOLSTER BLD 10FT (ELECTRODE) ×3 IMPLANT
SEALANT PATCH FIBRIN 2X4IN (MISCELLANEOUS) ×1 IMPLANT
SEALANT SURG COSEAL 8ML (VASCULAR PRODUCTS) ×2 IMPLANT
SET CARDIOPLEGIA MPS 5001102 (MISCELLANEOUS) ×1 IMPLANT
SET VEIN GRAFT PERF (SET/KITS/TRAYS/PACK) ×1 IMPLANT
SPOGE SURGIFLO 8M (HEMOSTASIS) ×1
SPONGE LAP 18X18 X RAY DECT (DISPOSABLE) ×1 IMPLANT
SPONGE LAP 4X18 X RAY DECT (DISPOSABLE) ×1 IMPLANT
SPONGE SURGIFLO 8M (HEMOSTASIS) IMPLANT
SUT BONE WAX W31G (SUTURE) ×3 IMPLANT
SUT ETHIBON 2 0 V 52N 30 (SUTURE) ×8 IMPLANT
SUT ETHIBOND 2 0 SH (SUTURE) ×18
SUT ETHIBOND 2 0 SH 36X2 (SUTURE) ×2 IMPLANT
SUT PROLENE 3 0 RB 1 (SUTURE) ×3 IMPLANT
SUT PROLENE 3 0 SH 1 (SUTURE) ×3 IMPLANT
SUT PROLENE 3 0 SH1 36 (SUTURE) ×10 IMPLANT
SUT PROLENE 4 0 RB 1 (SUTURE) ×18
SUT PROLENE 4 0 TF (SUTURE) ×6 IMPLANT
SUT PROLENE 4-0 RB1 .5 CRCL 36 (SUTURE) ×4 IMPLANT
SUT PROLENE 4-0 RB1 18X2 ARM (SUTURE) IMPLANT
SUT PROLENE 5 0 C 1 36 (SUTURE) ×11 IMPLANT
SUT PROLENE 6 0 C 1 30 (SUTURE) ×2 IMPLANT
SUT PROLENE 6 0 CC (SUTURE) ×6 IMPLANT
SUT PROLENE 7 0 BV1 MDA (SUTURE) ×3 IMPLANT
SUT PROLENE 8 0 BV175 6 (SUTURE) ×2 IMPLANT
SUT SILK  1 MH (SUTURE) ×4
SUT SILK 1 MH (SUTURE) IMPLANT
SUT SILK 1 TIES 10X30 (SUTURE) ×1 IMPLANT
SUT SILK 2 0 SH CR/8 (SUTURE) ×2 IMPLANT
SUT SILK 2 0 TIES 10X30 (SUTURE) ×1 IMPLANT
SUT SILK 2 0 TIES 17X18 (SUTURE) ×3
SUT SILK 2-0 18XBRD TIE BLK (SUTURE) IMPLANT
SUT SILK 3 0 SH CR/8 (SUTURE) ×1 IMPLANT
SUT SILK 4 0 TIE 10X30 (SUTURE) ×2 IMPLANT
SUT STEEL 6MS V (SUTURE) ×3 IMPLANT
SUT STEEL SZ 6 DBL 3X14 BALL (SUTURE) ×3 IMPLANT
SUT TEM PAC WIRE 2 0 SH (SUTURE) ×4 IMPLANT
SUT VIC AB 1 CTX 18 (SUTURE) ×6 IMPLANT
SUT VIC AB 2-0 CTX 27 (SUTURE) ×2 IMPLANT
SUT VIC AB 3-0 X1 27 (SUTURE) ×2 IMPLANT
SUTURE E-PAK OPEN HEART (SUTURE) ×2 IMPLANT
SYR 5ML LUER SLIP (SYRINGE) ×1 IMPLANT
SYSTEM SAHARA CHEST DRAIN ATS (WOUND CARE) ×3 IMPLANT
TOWEL GREEN STERILE (TOWEL DISPOSABLE) ×10 IMPLANT
TOWEL GREEN STERILE FF (TOWEL DISPOSABLE) ×4 IMPLANT
TOWEL OR 17X24 6PK STRL BLUE (TOWEL DISPOSABLE) ×4 IMPLANT
TOWEL OR 17X26 10 PK STRL BLUE (TOWEL DISPOSABLE) ×4 IMPLANT
TRAY FOLEY SILVER 14FR TEMP (SET/KITS/TRAYS/PACK) ×2 IMPLANT
TRAY FOLEY SILVER 16FR TEMP (SET/KITS/TRAYS/PACK) ×3 IMPLANT
TUBE CONNECTING 12X1/4 (SUCTIONS) ×1 IMPLANT
TUBE FEEDING 8FR 16IN STR KANG (MISCELLANEOUS) ×2 IMPLANT
TUBING INSUFFLATION (TUBING) ×2 IMPLANT
UNDERPAD 30X30 (UNDERPADS AND DIAPERS) ×3 IMPLANT
VALVE MAGNA EASE AORTIC 29MM (Prosthesis & Implant Heart) ×1 IMPLANT
VENT LEFT HEART 12002 (CATHETERS) ×3
WATER STERILE IRR 1000ML POUR (IV SOLUTION) ×6 IMPLANT
YANKAUER SUCT BULB TIP NO VENT (SUCTIONS) ×1 IMPLANT

## 2016-09-15 NOTE — Progress Notes (Signed)
CTS PM note  S/P AVR Ascending aortic graft Coagulopathy with bleeding plts and cryo ordered, PEEP to 8 BP 95/60 CI 2.2

## 2016-09-15 NOTE — Anesthesia Procedure Notes (Signed)
Arterial Line Insertion Start/End8/21/2018 7:08 AM Performed by: Harden Mo, CRNA  Patient location: Pre-op. Preanesthetic checklist: patient identified, IV checked, site marked, risks and benefits discussed, surgical consent, monitors and equipment checked, pre-op evaluation and anesthesia consent Lidocaine 1% used for infiltration and patient sedated Left, radial was placed Catheter size: 20 G Hand hygiene performed  and maximum sterile barriers used   Attempts: 2 Procedure performed without using ultrasound guided technique. Ultrasound Notes:anatomy identified and no ultrasound evidence of intravascular and/or intraneural injection Following insertion, dressing applied and Biopatch. Post procedure assessment: normal  Patient tolerated the procedure well with no immediate complications.

## 2016-09-15 NOTE — Brief Op Note (Addendum)
      [address]       [place] [address]             [phone]     09/15/2016  12:56 PM  PATIENT:  Lonnie Hansen  66 y.o. male  PRE-OPERATIVE DIAGNOSIS:  AI, dilated aortic root CAD TAA  POST-OPERATIVE DIAGNOSIS:  ]same  PROCEDURE:  Procedure(s) with comments: AORTIC VALVE REPLACEMENT (AVR) (N/A) - Using 25mm Edwards Perimount Magna Ease Aortic Bioprosthesis Valve ASCENDING AORTIC ROOT REPLACEMENT (N/A) - Using 28mm Gelweave Valsalva Graft TRANSESOPHAGEAL ECHOCARDIOGRAM (TEE) (N/A)  SURGEON:  Surgeon(s) and Role:    * Grace Isaac, MD - Primary  PHYSICIAN ASSISTANT: WAYNE GOLD PA-C  ANESTHESIA:   general  EBL:  Total I/O In: -  Out: 2750 [Urine:2750]  BLOOD ADMINISTERED:1 PACK PLTS  DRAINS: 3 CHEST TUBES   LOCAL MEDICATIONS USED:  NONE  SPECIMEN:  Source of Specimen:  AORTIC VALVE LEAFLEETS  DISPOSITION OF SPECIMEN:  PATHOLOGY  COUNTS:  YES   DICTATION: .Other Dictation: Dictation Number PENDING  PLAN OF CARE: Admit to inpatient   PATIENT DISPOSITION:  ICU - intubated and hemodynamically stable.   Delay start of Pharmacological VTE agent (>24hrs) due to surgical blood loss or risk of bleeding: yes  COMPLICATIONS: NO KNOWN

## 2016-09-15 NOTE — Progress Notes (Signed)
  Echocardiogram Echocardiogram Transesophageal has been performed.  Lonnie Hansen [name] 09/15/2016, 8:50 AM

## 2016-09-15 NOTE — Anesthesia Postprocedure Evaluation (Signed)
Anesthesia Post Note  Patient: Lonnie Hansen  Procedure(s) Performed: Procedure(s) (LRB): AORTIC VALVE REPLACEMENT (AVR) (N/A) ASCENDING AORTIC ROOT REPLACEMENT (N/A) TRANSESOPHAGEAL ECHOCARDIOGRAM (TEE) (N/A)     Patient location during evaluation: SICU Anesthesia Type: General Level of consciousness: sedated Pain management: pain level controlled Vital Signs Assessment: post-procedure vital signs reviewed and stable Respiratory status: patient remains intubated per anesthesia plan Cardiovascular status: stable Anesthetic complications: no    Last Vitals:  Vitals:   09/15/16 1626 09/15/16 1700  BP:    Pulse:  89  Resp:  13  Temp: (!) 35.8 C (!) 35.8 C  SpO2:  100%    Last Pain:  Vitals:   09/15/16 0601  TempSrc:   PainSc: 3                  Kailer Heindel,W. EDMOND

## 2016-09-15 NOTE — OR Nursing (Signed)
Forty five minute call to SICU charge nurse at 1434. Spoke to [place].

## 2016-09-15 NOTE — Anesthesia Procedure Notes (Signed)
Central Venous Catheter Insertion Performed by: Roderic Palau, anesthesiologist Start/End8/21/2018 6:45 AM, 09/15/2016 6:55 AM Patient location: Pre-op. Preanesthetic checklist: patient identified, IV checked, site marked, risks and benefits discussed, surgical consent, monitors and equipment checked, pre-op evaluation, timeout performed and anesthesia consent Position: Trendelenburg Lidocaine 1% used for infiltration and patient sedated Hand hygiene performed , maximum sterile barriers used  and Seldinger technique used Catheter size: 9 Fr Total catheter length 10. Central line and PA cath was placed.Sheath introducer Swan type:thermodilution PA Cath depth:50 Procedure performed using ultrasound guided technique. Ultrasound Notes:anatomy identified, needle tip was noted to be adjacent to the nerve/plexus identified, no ultrasound evidence of intravascular and/or intraneural injection and image(s) printed for medical record Attempts: 1 Following insertion, line sutured and dressing applied. Post procedure assessment: blood return through all ports, free fluid flow and no air  Patient tolerated the procedure well with no immediate complications.

## 2016-09-15 NOTE — OR Nursing (Signed)
Twenty minute call to SICU at 1510. Spoke to [place]. Cath Lab updated of timing as well.

## 2016-09-15 NOTE — Progress Notes (Signed)
[address]       [place] [address]             [phone]                    Lonnie Hansen [facility] Medical Record #[id number] Date of Birth: 01/25/1951  Referring:PATERSON,DANIEL G, MD Primary Care: [name], MD  Chief Complaint:    Aortic Insufficiency   History of Present Illness:    Patient has been followed in the office and worked up for avr /root replacement .  When last seen he had active pustules in his scalp that were being treated. He completed a course of by mouth antibioticsand the area is completely healed . He has also completed   dental extraction.    The patient was originally was having hip pain making it difficult for him to be active. He was seeing Dr. Ninfa Linden for consideration of right hip replacement. In preparation for this a chest x-ray was performed leading to a CT scan of the chest. The patient has a previous CT scan of the chest done during admission in 2012 for syncope.  Since  episode of syncope in 2012 he's had no repeat syncopal episodes, notes rare palpitations. He has no previous history of myocardial infarction. He has increasing SOB with activity.  His family history is significant for his father who had had a MI prior but "drop dead at home" at age 15 in 38. His mother died of lymphoma date 44 1 brother and 1 sister alive his brothers had a myocardial infarction in the past., He has no children. There is no other history can on Cerone calls her cousins of unexplained sudden death at a young age.   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       Current Activity/ Functional Status:  Patient is independent with mobility/ambulation, transfers, ADL's, IADL's.  But does use a cane because of pain in the right hip  Zubrod Score: At the time of surgery this patient's most appropriate activity status/level should be  described as: []     0    Normal activity, no symptoms [x]     1    Restricted in physical strenuous activity but ambulatory, able to do out light work []     2    Ambulatory and capable of self care, unable to do work activities, up and about               >50 % of waking hours                              []     3    Only limited self care, in bed greater than 50% of waking hours []     4    Completely disabled, no self care, confined to bed or chair []     5    Moribund   Past Medical History:  Diagnosis Date  . Anemia   . Arthritis   . Asymptomatic bilateral carotid artery stenosis 08/2015   1-39%   . Blood dyscrasia    "trouble with my bloods clotting"  . Bruising    on the skin states due to platelets or sometimes low or high  . Cataracts, bilateral   . Coronary artery disease   .  Diverticulosis   . Enlarged aorta ([place])   . Enlarged prostate    slightly  . GERD (gastroesophageal reflux disease)    takes Pantoprazole daily as needed  . Glaucoma    uses eye drops daily  . Headache   . History of colon polyps    benign  . History of kidney stones   . Hyperlipidemia    no on any meds  . Hypertension    takes Amlodipine and Atenolol daily  . Joint pain   . Joint swelling   . Nocturia   . Sleep apnea   . Vocal cord nodule    pt. states  it's a" growth on vocal cord"    Past Surgical History:  Procedure Laterality Date  . AORTIC ARCH ANGIOGRAPHY N/A 04/16/2016   Procedure: Aortic Arch Angiography;  Surgeon: [name] Martinique, MD;  Location: [facility];  Service: Cardiovascular;  Laterality: N/A;  . COLONOSCOPY    . COLONOSCOPY WITH ESOPHAGOGASTRODUODENOSCOPY (EGD)    . MULTIPLE EXTRACTIONS WITH ALVEOLOPLASTY N/A 06/10/2016   Procedure: Extraction of tooth #'s 2,8,13,15, and 29  with alveoloplasty, maxillary right and left buccal exostoses reductions, and gross debridement of remaining teeth.;  Surgeon: Lenn Cal, DDS;  Location: [facility];  Service: Oral Surgery;   Laterality: N/A;  . RIGHT/LEFT HEART CATH AND CORONARY ANGIOGRAPHY N/A 04/16/2016   Procedure: Right/Left Heart Cath and Coronary Angiography;  Surgeon: [name] Martinique, MD;  Location: [facility];  Service: Cardiovascular;  Laterality: N/A;    Family History  Problem Relation Age of Onset  . Heart disease Father   . Heart attack Father   . Thyroid disease Sister   As noted above his father died suddenly at age 41, he had a previous history of myocardial infarction   Social History   Social History  . Marital Status: Married    Spouse Name: N/A  . Number of Children: N/A  . Years of Education: N/A   Occupational History  . Not on file.   Social History Main Topics  . Smoking status: Former Smoker -- 1.00 packs/day for 25 years    Types: Cigarettes  . Smokeless tobacco: Not on file  . Alcohol Use: No  . Drug Use: No  . Sexual Activity: Not on file   Other Topics Concern  . Not on file   Social History Narrative   Married.   He is a Optometrist.       History  Smoking Status  . Former Smoker  . Packs/day: 1.00  . Years: 25.00  . Types: Cigarettes  Smokeless Tobacco  . Never Used    History  Alcohol Use No     Allergies  Allergen Reactions  . No Known Allergies     Current Facility-Administered Medications  Medication Dose Route Frequency Provider Last Rate Last Dose  . cefUROXime (ZINACEF) 1.5 g in dextrose 5 % 50 mL IVPB  1.5 g Intravenous To OR Grace Isaac, MD      . cefUROXime (ZINACEF) 750 mg in dextrose 5 % 50 mL IVPB  750 mg Intravenous To OR Grace Isaac, MD      . chlorhexidine (HIBICLENS) 4 % liquid 2 application  30 mL Topical UD Grace Isaac, MD      . dexmedetomidine (PRECEDEX) 400 MCG/100ML (4 mcg/mL) infusion  0.1-0.7 mcg/kg/hr Intravenous To OR Grace Isaac, MD      . DOPamine (INTROPIN) 800 mg in dextrose 5 %  250 mL (3.2 mg/mL) infusion  0-10 mcg/kg/min Intravenous To OR Grace Isaac, MD      .  EPINEPHrine (ADRENALIN) 4 mg in dextrose 5 % 250 mL (0.016 mg/mL) infusion  0-10 mcg/min Intravenous To OR Grace Isaac, MD      . heparin 2,500 Units, papaverine 30 mg in electrolyte-148 (PLASMALYTE-148) 500 mL irrigation   Irrigation To OR Grace Isaac, MD      . heparin 30,000 units/NS 1000 mL solution for CELLSAVER   Other To OR Grace Isaac, MD      . insulin regular (NOVOLIN R,HUMULIN R) 100 Units in sodium chloride 0.9 % 100 mL (1 Units/mL) infusion   Intravenous To OR Grace Isaac, MD      . magnesium sulfate (IV Push/IM) injection 40 mEq  40 mEq Other To OR Grace Isaac, MD      . metoprolol tartrate (LOPRESSOR) tablet 12.5 mg  12.5 mg Oral Once Grace Isaac, MD      . nitroGLYCERIN 50 mg in dextrose 5 % 250 mL (0.2 mg/mL) infusion  2-200 mcg/min Intravenous To OR Grace Isaac, MD      . phenylephrine (NEO-SYNEPHRINE) 20 mg in sodium chloride 0.9 % 250 mL (0.08 mg/mL) infusion  30-200 mcg/min Intravenous To OR Grace Isaac, MD      . potassium chloride injection 80 mEq  80 mEq Other To OR Grace Isaac, MD      . tranexamic acid (CYKLOKAPRON) 2,500 mg in sodium chloride 0.9 % 250 mL (10 mg/mL) infusion  1.5 mg/kg/hr Intravenous To OR Grace Isaac, MD      . tranexamic acid (CYKLOKAPRON) bolus via infusion - over 30 minutes 1,389 mg  15 mg/kg Intravenous To OR Grace Isaac, MD      . tranexamic acid (CYKLOKAPRON) pump prime solution 185 mg  2 mg/kg Intracatheter To OR Grace Isaac, MD      . vancomycin (VANCOCIN) 1,500 mg in sodium chloride 0.9 % 250 mL IVPB  1,500 mg Intravenous To OR Grace Isaac, MD       Facility-Administered Medications Ordered in Other Encounters  Medication Dose Route Frequency Provider Last Rate Last Dose  . fentaNYL (SUBLIMAZE) injection    Anesthesia Intra-op Harden Mo, CRNA   50 mcg at 09/15/16 0645  . lactated ringers infusion    Continuous PRN Harden Mo, CRNA      . lactated  ringers infusion    Continuous PRN Harden Mo, CRNA      . lactated ringers infusion    Continuous PRN Harden Mo, CRNA      . midazolam (VERSED) 5 MG/5ML injection   Intravenous Anesthesia Intra-op Harden Mo, CRNA   1 mg at 09/15/16 5784     Review of Systems:     Cardiac Review of Systems: Y or N  Chest Pain [  N ]  Resting SOB [ N Exertional SOB  [Y]  Orthopnea [ N ]   Pedal Edema Aqua.Slicker   ]    Palpitations Aqua.Slicker  ] Syncope  [ once 2012 ]   Presyncope Aqua.Slicker ]  General Review of Systems: [Y] = yes [  ]=no Constitional: recent weight change [ gain ];  Wt loss over the last 3 months [   ] anorexia [  ]; fatigue [ y ]; nausea [ n ]; night sweats [ n ]; fever [n]; or chills [  n];          Dental: poor dentition[  n]; Last Dentist visit: DENTAL EXTRACTION DONE  Eye : blurred vision [ N ]; diplopia [   ]; vision changes Aqua.Slicker  ];  Amaurosis fugax[N  ]; Resp: cough [n  ];  wheezing[ n ];  hemoptysis[ n ]; shortness of breath[  ]; paroxysmal nocturnal dyspnea[  ]; dyspnea on exertion[  ]; or orthopnea[  ];  GI:  gallstones[  ], vomiting[N ];  dysphagia[N  ]; melena[  ];  hematochezia [  ]; heartburn[ N ];   Hx of  Colonoscopy[ y last year ]; GU: kidney stones [  ]; hematuria[  ];   dysuria [  ];  nocturia[  ];  history of     obstruction [  ]; urinary frequency [  ]             Skin: rash, swelling[  ];, hair loss[  ];  peripheral edema[  ];  or itching[  ]; Musculosketetal: myalgias[  ];  joint swelling[  ];  joint erythema[  ];  joint pain[  ];  back pain[  ];  Heme/Lymph: bruising[  ];  bleeding[  ];  anemia[  ];  Neuro: TIA[ N ];  headaches[N  ];  stroke[ N ];  vertigo[ N ];  seizures[ n ];   paresthesias[  ];  difficulty walking[ y ];  Psych:depression[  ]; anxiety[  ];  Endocrine: diabetes[ n ];  thyroid dysfunction[n ];  Immunizations: Flu up to date [ y]; Pneumococcal up to date [ n ];  Other:  Physical Exam: BP 133/78   Pulse (!) 58   Temp 98.4 F (36.9 C) (Oral)   Resp 20   SpO2  100%   PHYSICAL EXAMINATION: Physical Exam  Constitutional: He is oriented to person, place, and time. He appears well-developed and well-nourished. No distress.  HENT:  Mouth/Throat: No oropharyngeal exudate.  Eyes: Left eye exhibits no discharge.  Neck: No tracheal deviation present. No thyromegaly present.  Cardiovascular: Regular rhythm.  Exam reveals no gallop and no friction rub.   Murmur heard. Respiratory: No stridor. No respiratory distress. He has no wheezes. He has no rales. He exhibits no tenderness.  GI: He exhibits no distension and no mass. There is no tenderness. There is no rebound and no guarding.  Musculoskeletal: He exhibits no edema, tenderness or deformity.  Lymphadenopathy:    He has no cervical adenopathy.  Neurological: He is alert and oriented to person, place, and time.  Skin: Skin is warm and dry. He is not diaphoretic. No erythema.  Psychiatric: He has a normal mood and affect. His behavior is normal. Judgment and thought content normal.  Murmur of aortic insufficiency is noted.  The area of previous pustule  on the right posterior scalp is completely healed  Has palpable DP and PT pulses bilaterally  Diagnostic Studies & Laboratory data:     Recent Radiology Findings:   Dg Chest 2 View  Result Date: 09/11/2016 CLINICAL DATA:  Thoracic aortic aneurysm. EXAM: CHEST  2 VIEW COMPARISON:  CT 03/26/2016. FINDINGS: Mediastinum and hilar structures normal. Heart size normal. Thoracic aortic aneurysm again noted. No interim change. No acute pulmonary disease. No pleural effusion or pneumothorax . Thoracic spine scoliosis . IMPRESSION: Thoracic aortic aneurysm again noted without interim change. No acute pulmonary disease. Electronically Signed   By: [place]   On: 09/11/2016 13:33   Ct Angio Chest Aorta W/cm &/or Wo/cm  Addendum Date: 03/29/2016   ADDENDUM REPORT: 03/29/2016 14:08 ADDENDUM: The aortic root at the level of the sinuses of Valsalva was  remeasured at approximately 4.9- 5.2 cm in greatest diameter depending on axis of measurement. This appears stable compared to the prior coronary CTA on 08/28/2015. Electronically Signed   By: Aletta Edouard M.D.   On: 03/29/2016 14:08    Ct Angio Chest Aorta W/cm &/or Wo/cm  Result Date: 03/26/2016 CLINICAL DATA:  Thoracic aortic aneurysm without rupture. EXAM: CT ANGIOGRAPHY CHEST WITH CONTRAST TECHNIQUE: Multidetector CT imaging of the chest was performed using the standard protocol during bolus administration of intravenous contrast. Multiplanar CT image reconstructions and MIPs were obtained to evaluate the vascular anatomy. CONTRAST:  75 mL of Isovue 370 intravenously. COMPARISON:  CT scan of July 24, 2015. FINDINGS: Cardiovascular: There is no evidence of thoracic aortic dissection. Great vessels are widely patent without significant stenosis. Aneurysmal aortic root is noted measured at 5.6 cm. Proximal ascending thoracic aorta measures 4.1 cm in diameter. Transverse aortic arch measures 4.0 cm. Descending thoracic aorta has maximum measured diameter of 3.9 cm. Coronary artery calcifications are noted. Mediastinum/Nodes: No enlarged mediastinal, hilar, or axillary lymph nodes. Thyroid gland, trachea, and esophagus demonstrate no significant findings. Lungs/Pleura: Lungs are clear. No pleural effusion or pneumothorax. Upper Abdomen: Large gallstone is noted. Stable partially calcified left renal cyst is noted. Musculoskeletal: No chest wall abnormality. No acute or significant osseous findings. Review of the MIP images confirms the above findings. IMPRESSION: Stable aneurysmal dilatation of aortic root is noted at 5.6 cm. 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 Recommend annual imaging followup by CTA or MRA. This recommendation follows 2010 ACCF/AHA/AATS/ACR/ASA/SCA/SCAI/SIR/STS/SVM Guidelines for the Diagnosis and Management of Patients with Thoracic Aortic Disease. Circulation. 2010;  121: W109-N235. Coronary artery calcifications are noted suggesting coronary disease. Large gallstone is noted. Electronically Signed   By: Marijo Conception, M.D.   On: 03/26/2016 15:37   I have independently reviewed the above radiology studies  and reviewed the findings with the patient. I've also discussed the CT findings with Dr. Nyoka Cowden , as the measurements I get both on axial coronal and sagittal cuts measures the aortic root at approximate 5.2    Ct Coronary Morph W/cta Cor W/score W/ca W/cm &/or Wo/cm  Addendum Date: 08/28/2015   ADDENDUM REPORT: 08/28/2015 13:09 CLINICAL DATA:  Aortic Aneurysm EXAM: Cardiac  CT TECHNIQUE: The patient was scanned on a Philips 256 scanner. A 120 kV retrospective scan was triggered in the descending thoracic aorta at 111 HU's. Gantry rotation speed was 270 msecs and collimation was .9 mm. No beta blockade or nitro were given. The 3D data set was reconstructed in 5% intervals of the R-R cycle. Systolic and diastolic phases were analyzed on a dedicated work station using MPR, MIP and VRT modes. The patient received 80 cc of contrast. FINDINGS: Aortic Valve:  Trileaflet without significant calcium Aorta: Fusiform dilatation of the ascending aorta widest at the sinus level. Bovine Arch with no coarctation. Isolated area of calcification at the isthmus with mild aneurysmal dilatation in this area as well Orthogonal double oblique measurements as follows Sinotubular Junction:  40 mm Ascending Thoracic Aorta:  42 mm Aortic Arch:  40 mm Isthmus:  40 mm Descending Thoracic Aorta:  27 mm Sinus of Valsalva Measurements: Non-coronary:  45 mm Right coronary: 46 mm Left coronary: 48 mm : 1) Trileaflet aortic valve with no significant calcification 2) Fusiform dilatation of the aortic sinuses maximum measurement  in coronal view 51 mm. However anatomic short axis Views show largest diameter of 48 mm for left coronary sinus 3) Tortuous left subclavian vein 4) Bovine Arch 5) Mild  dilatation of the aortic root, arch and isthmus 6) Normal descending thoracic aorta with no coarctation [place] Electronically Signed   By: [place] Rouge M.D.   On: 08/28/2015 13:09   Result Date: 08/28/2015 EXAM: OVER-READ INTERPRETATION  CT CHEST The following report is an over-read performed by radiologist Dr. Rebekah Chesterfield [facility] on 08/28/2015. This over-read does not include interpretation of cardiac or coronary anatomy or pathology. The coronary calcium score/coronary CTA interpretation by the cardiologist is attached. COMPARISON:  Chest CT 07/24/2015. FINDINGS: Aortic atherosclerosis. Mild aneurysmal dilatation of the ascending thoracic aorta and the arch, both of which measure 4.5 cm in diameter. The isthmus is also aneurysmal measuring 4.3 cm in diameter. The remainder the descending thoracic aorta is otherwise normal in caliber. Aortic root dilatation (to be described separately). No evidence of thoracic aortic dissection. Within the visualized portions of the thorax there are no suspicious appearing pulmonary nodules or masses, there is no acute consolidative airspace disease, no pleural effusions, no pneumothorax and no lymphadenopathy. Visualized portions of the upper abdomen are unremarkable. There are no aggressive appearing lytic or blastic lesions noted in the visualized portions of the skeleton. IMPRESSION: 1. No acute incidental noncardiac findings are noted. 2. Aneurysmal dilatation of the ascending thoracic aorta, aortic arch and isthmus, as above. No thoracic aortic dissection at this time. Recommend semi-annual imaging followup by CTA or MRA. This recommendation follows 2010 ACCF/AHA/AATS/ACR/ASA/SCA/SCAI/SIR/STS/SVM Guidelines for the Diagnosis and Management of Patients With Thoracic Aortic Disease. Circulation. 2010; 121: N562-Z308. Electronically Signed: By: Vinnie Langton M.D. On: 08/28/2015 10:59    Ct Chest W Contrast  07/24/2015  CLINICAL DATA:  Ectatic  thoracic aorta on recent chest radiograph EXAM: CT CHEST WITH CONTRAST TECHNIQUE: Multidetector CT imaging of the chest was performed during intravenous contrast administration. CONTRAST:  31mL ISOVUE-300 IOPAMIDOL (ISOVUE-300) INJECTION 61% COMPARISON:  Most recent available chest CT April 27, 2010 FINDINGS: Mediastinum/Lymph Nodes: There is dilatation of the ascending thoracic aorta. Just beyond the aortic valve, the ascending thoracic aorta measures 6.1 x 4.9 cm, stable from prior study. At the level of the proximal right main pulmonary artery, the thoracic aorta has a maximum transverse diameter of 4.6 x 4.1 cm, essentially stable from prior study. At the level of the aortic arch, the transverse diameter is 4.6 cm, stable. There is no thoracic aortic dissection. Visualized great vessels appear somewhat tortuous but otherwise unremarkable. There is no major vessel pulmonary embolus. There are multiple foci of coronary artery calcification. There is no appreciable pericardial thickening. Thyroid appears unremarkable. There is no appreciable thoracic adenopathy. Lungs/Pleura: There is slight scarring in the lung bases. There is no parenchymal lung edema or consolidation. Upper abdomen: In the visualized upper abdomen, there is cholelithiasis. Gallbladder wall does not appear appreciably thickened. There is a complex cyst in the upper left kidney containing calcification, unchanged compared to prior study. There is a parapelvic cyst on the right, incompletely visualized, measuring 4.2 x 3.5 cm. Musculoskeletal: There are no blastic or lytic bone lesions. IMPRESSION: Thoracic aortic prominence as noted above. Prominence is greatest just beyond the aortic valve. Question a degree of aortic valve disease. Recommend semi-annual imaging followup by CTA or MRA and referral to cardiothoracic surgery if not already obtained. This recommendation follows 2010 ACCF/AHA/AATS/ACR/ASA/SCA/SCAI/SIR/STS/SVM Guidelines for the  Diagnosis and Management  of Patients With Thoracic Aortic Disease. Circulation. 2010; 121: [id number]. Note that this aortic prominence is essentially stable compared to 2012 study. There is no periaortic fluid. No thoracic aortic dissection noted. No pericardial effusion. There are multiple areas of coronary artery calcification. No parenchymal lung edema or consolidation. No parenchymal lung mass or adenopathy. Cholelithiasis. Stable cystic lesion containing multiple calcifications in the upper pole left kidney. Parapelvic cyst right kidney. Electronically Signed   By: Lowella Grip III M.D.   On: 07/24/2015 11:00    I have independently reviewed the above radiology studies  and reviewed the findings with the patient.  ECHO: Echocardiography  Patient:    Lonnie Hansen, Lonnie Hansen MR #:       [id number] Study Date: 09/20/2015 Gender:     M Age:        89 Height:     175.3 cm Weight:     91.6 kg BSA:        2.14 m^2 Pt. Status: Room:   [place] MD  [place] MD  REFERRING    [name] [id number]  ATTENDING    Candee Furbish, M.D.  SONOGRAPHER  Cindy Hazy, RDCS  PERFORMING   [facility], Outpatient  cc:  ------------------------------------------------------------------- LV EF: 60% -   65%  ------------------------------------------------------------------- Indications:      I35.1 Aortic Insufficiency.  I71.2 Ascending Aortic Aneurysm.  ------------------------------------------------------------------- History:   PMH:  Acquired from the patient and from the patient&'s chart.  PMH:  Dilated Thoracic Aorta.  Risk factors:  Hypertension. Dyslipidemia.  ------------------------------------------------------------------- Study Conclusions  - Left ventricle: The cavity size was normal. There was moderate   focal basal hypertrophy of the septum. Systolic function was   normal. The estimated ejection fraction was in the range of 60%   to  65%. Wall motion was normal; there were no regional wall   motion abnormalities. Doppler parameters are consistent with   abnormal left ventricular relaxation (grade 1 diastolic   dysfunction). - Aortic valve: There was moderate to severe regurgitation. - Aorta: Aortic root dimension: 43 mm (ED). - Ascending aorta: The ascending aorta was mildly dilated. - Right ventricle: The cavity size was mildly dilated. Wall   thickness was normal.  ------------------------------------------------------------------- Study data:   Study status:  Routine.  Procedure:  The patient reported no pain pre or post test. Transthoracic echocardiography for left ventricular function evaluation, for right ventricular function evaluation, and for assessment of valvular function. Image quality was adequate.  Study completion:  There were no complications.          Echocardiography.  M-mode, complete 2D, spectral Doppler, and color Doppler.  Birthdate:  Patient birthdate: Dec 22, 1950.  Age:  Patient is 66 yr old.  Sex:  Gender: male.    BMI: 29.8 kg/m^2.  Blood pressure:     136/74  Patient status:  Outpatient.  Study date:  Study date: 09/20/2015. Study time: 10:58 AM.  Location:  [facility]  -------------------------------------------------------------------  ------------------------------------------------------------------- Left ventricle:  The cavity size was normal. There was moderate focal basal hypertrophy of the septum. Systolic function was normal. The estimated ejection fraction was in the range of 60% to 65%. Wall motion was normal; there were no regional wall motion abnormalities. Doppler parameters are consistent with abnormal left ventricular relaxation (grade 1 diastolic dysfunction).  ------------------------------------------------------------------- Aortic valve:   Trileaflet; normal thickness leaflets. Mobility was not restricted.  Doppler:  Transvalvular velocity was within  the normal  range. There was no stenosis. There was moderate to severe regurgitation.  ------------------------------------------------------------------- Aorta:  Ascending aorta: The ascending aorta was mildly dilated.  ------------------------------------------------------------------- Mitral valve:   Structurally normal valve.   Mobility was not restricted.  Doppler:  Transvalvular velocity was within the normal range. There was no evidence for stenosis. There was no regurgitation.  ------------------------------------------------------------------- Left atrium:  The atrium was normal in size.  ------------------------------------------------------------------- Right ventricle:  The cavity size was mildly dilated. Wall thickness was normal. Systolic function was normal.  ------------------------------------------------------------------- Pulmonic valve:    Doppler:  Transvalvular velocity was within the normal range. There was no evidence for stenosis.  ------------------------------------------------------------------- Tricuspid valve:   Structurally normal valve.    Doppler: Transvalvular velocity was within the normal range. There was trivial regurgitation.  ------------------------------------------------------------------- Pulmonary artery:   The main pulmonary artery was normal-sized. Systolic pressure was within the normal range.  ------------------------------------------------------------------- Right atrium:  The atrium was normal in size.  ------------------------------------------------------------------- Pericardium:  There was no pericardial effusion.  ------------------------------------------------------------------- Systemic veins: Inferior vena cava: The vessel was normal in size.  ------------------------------------------------------------------- Measurements   Left ventricle                           Value        Reference  LV ID, ED,  PLAX chordal                  50.9  mm     43 - 52  LV ID, ES, PLAX chordal                  34.6  mm     23 - 38  LV fx shortening, PLAX chordal           32    %      >=29  LV PW thickness, ED                      10.9  mm     ---------  IVS/LV PW ratio, ED              (H)     1.39         <=1.3  Stroke volume, 2D                        124   ml     ---------  Stroke volume/bsa, 2D                    58    ml/m^2 ---------  LV e&', lateral                           5.26  cm/s   ---------  LV E/e&', lateral                         7.7          ---------  LV e&', medial                            5.55  cm/s   ---------  LV E/e&', medial                          7.3          ---------  LV e&', average                           5.41  cm/s   ---------  LV E/e&', average                         7.49         ---------    Ventricular septum                       Value        Reference  IVS thickness, ED                        15.2  mm     ---------    LVOT                                     Value        Reference  LVOT ID, S                               30    mm     ---------  LVOT area                                7.07  cm^2   ---------  LVOT ID                                  30    mm     ---------  LVOT peak velocity, S                    76.7  cm/s   ---------  LVOT mean velocity, S                    53.7  cm/s   ---------  LVOT VTI, S                              17.5  cm     ---------  LVOT peak gradient, S                    2     mm Hg  ---------  Stroke volume (SV), LVOT DP              123.7 ml     ---------  Stroke index (SV/bsa), LVOT DP           57.9  ml/m^2 ---------    Aortic valve                             Value        Reference  Aortic regurg pressure half-time         916   ms     ---------    Aorta  Value        Reference  Aortic root ID, ED                       43    mm     ---------    Left atrium                               Value        Reference  LA ID, A-P, ES                           33    mm     ---------  LA ID/bsa, A-P                           1.54  cm/m^2 <=2.2  LA volume, S                             37    ml     ---------  LA volume/bsa, S                         17.3  ml/m^2 ---------  LA volume, ES, 1-p A4C                   31    ml     ---------  LA volume/bsa, ES, 1-p A4C               14.5  ml/m^2 ---------  LA volume, ES, 1-p A2C                   44    ml     ---------  LA volume/bsa, ES, 1-p A2C               20.6  ml/m^2 ---------    Mitral valve                             Value        Reference  Mitral E-wave peak velocity              40.5  cm/s   ---------  Mitral A-wave peak velocity              57.8  cm/s   ---------  Mitral deceleration time         (H)     257   ms     150 - 230  Mitral E/A ratio, peak                   0.7          ---------    Tricuspid valve                          Value        Reference  Tricuspid regurg peak velocity           251   cm/s   ---------  Tricuspid peak RV-RA gradient            25    mm Hg  ---------    Right ventricle  Value        Reference  RV s&', lateral, S                        18    cm/s   ---------  Legend: (L)  and  (H)  mark values outside specified reference range.  ------------------------------------------------------------------- Prepared and Electronically Authenticated by  Candee Furbish, M.D. 2017-08-25T13:27:13  There is  moderate to severe regurgitation On echo  EF 60- 65%   LV ID, ED, PLAX chordal                  50.9  mm     43 - 52  LV ID, ES, PLAX chordal                  34.6  mm     23 - 38    Cath: Procedures   Aortic Arch Angiography  Right/Left Heart Cath and Coronary Angiography  Conclusion     Mid RCA lesion, 80 %stenosed.  Ost LAD to Prox LAD lesion, 25 %stenosed.  Dist LAD lesion, 50 %stenosed.  There is mild left ventricular systolic dysfunction.  LV end  diastolic pressure is normal.  The left ventricular ejection fraction is 45-50% by visual estimate.  There is no mitral valve regurgitation.  There is no aortic valve stenosis.  LV end diastolic pressure is normal.   1. 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 Mild LV dysfunction. EF 45%. 3. Ascending thoracic aortic aneurysm. 4. 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 5. Normal Right heart  And LV filling pressures 6. Normal cardiac output.   Plan: Aortic root grafting and AVR.      I have independently reviewed the above  cath films and reviewed the findings with the  patient .   Recent Lab Findings:  Lab Results  Component Value Date   WBC 7.2 09/11/2016   HGB 13.7 09/11/2016   HCT 39.2 09/11/2016   PLT 97 (L) 09/11/2016   GLUCOSE 100 (H) 09/11/2016   CHOL  04/28/2010    187        ATP III CLASSIFICATION:  <200     mg/dL   Desirable  200-239  mg/dL   Borderline High  >=240    mg/dL   High          TRIG 69 04/28/2010   HDL 54 04/28/2010   LDLCALC (H) 04/28/2010    119        Total Cholesterol/HDL:CHD Risk Coronary Heart Disease Risk Table                     Men   Women  1/2 Average Risk   3.4   3.3  Average Risk       5.0   4.4  2 X Average Risk   9.6   7.1  3 X Average Risk  23.4   11.0        Use the calculated Patient Ratio above and the CHD Risk Table to determine the patient's CHD Risk.        ATP III CLASSIFICATION (LDL):  <100     mg/dL   Optimal  100-129  mg/dL   Near or Above                    Optimal  130-159  mg/dL   Borderline  160-189  mg/dL   High  >190  mg/dL   Very High   ALT 17 09/11/2016   AST 31 09/11/2016   NA 140 09/11/2016   K 3.5 09/11/2016   CL 105 09/11/2016   CREATININE 0.86 09/11/2016   BUN 13 09/11/2016   CO2 25 09/11/2016   TSH 1.611 04/28/2010   INR 1.01 09/11/2016   HGBA1C 5.3 09/11/2016    Aortic Size Index=      5.2  /There is no height or weight on file to calculate BSA. = 2.5  < 2.75  cm/m2      4% risk per year 2.75 to 4.25          8% risk per year > 4.25 cm/m2    20% risk per year  The [employer] [facility]) and the [place] ([place]) have issued a statement to clarify 2 previous guidelines from the [facility], [place], and collaborating societies addressing the risk of aortic dissection in patients with bicuspid aortic valves (BAV) and severe aortic enlargement. The 2 guidelines differ with regard to the recommended threshold of aortic root or ascending aortic dilatation that would justify surgical intervention in patients with bicuspid aortic valves. This new statement of clarification uses the ACC/AHA revised structure for delineating the Class of Recommendation and Level of Evidence to provide recommendation that replace those contained in Section 9.2.2.1 of the thoracic aortic disease guidelines and Section 5.1.3 of the valvular heart disease guideline. New recommendations in intervention in patients with BAV and dilatation of the aortic root (sinuses) or ascending aorta include:  . Operative intervention to repair or replace the aortic root (sinuses) or replace the ascending aorta is indicated in asymptomatic patients with BAV if the diameter of the aortic root or ascending aorta is 5.5 cm or greater. (Class of recommendation 1, Level of evidence B-NR).  Marland Kitchen Operative intervention to repair or replace the aortic root (sinuses) or replace the ascending aorta is reasonable in asymptomatic patients with BAV if the diameter of the aortic root or ascending aorta is 5.0 cm or greater and an additional risk factor for dissection is present or if the patient is at low surgical risk and the surgery is performed by an experienced aortic surgical team in a center with established expertise in these procedures. (Class of recommendation IIa; Level of Evidence B-NR).  . Replacement of the ascending aorta is reasonable in patients with BAV undergoing AVR because of severe aortic  stenosis or aortic regurgitation when the diameter of the ascending aorta is greater than 4.5 cm (Class of recommendation IIa; Level of evidence C-EO).  Citation: Donnamarie Poag, Randolm Idol, et al. Surgery for aortic dilatation in patients with bicuspid aortic valves. A statement of clarification from the [employer]/American Heart Association Task Force on Clinical Practice Guidelines. [Published online ahead of print December 29, 2013]. Circulation. doi: 10.1161/CIR.0000000000000331.  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  Root replacement Aug 13 Now with the patient's scalp infection cleared I recommended that we proceed with the planned aortic root and ascending aorta replacement Risks and options have been discussed with patient in detail, and addition we have discussed possible need for permanent pacemaker, we have discussed mechanical versus tissue valve replacement, the patient prefers tissue valve.  The goals risks and alternatives of the planned surgical procedure Procedure(s): AORTIC VALVE REPLACEMENT (AVR) (N/A) CORONARY ARTERY BYPASS GRAFTING (CABG) (N/A) ASCENDING AORTIC ROOT REPLACEMENT (N/A) TRANSESOPHAGEAL ECHOCARDIOGRAM (TEE) (N/A)  have been discussed with the patient in detail. The risks of the procedure including death, infection, stroke, myocardial infarction, bleeding, blood transfusion, heart block have all been discussed specifically.  I have quoted [name] a 5 % of perioperative mortality and a complication rate as high as 40 %. The patient's questions have been answered.[name] is willing  to proceed with the planned procedure.    Grace Isaac MD      [address] [place] [address] Office [phone]   Beeper [phone]  09/15/2016 7:15 AM

## 2016-09-15 NOTE — Transfer of Care (Signed)
Immediate Anesthesia Transfer of Care Note  Patient: [name]  Procedure(s) Performed: Procedure(s) with comments: AORTIC VALVE REPLACEMENT (AVR) (N/A) - Using 15mm Edwards Perimount Magna Ease Aortic Bioprosthesis Valve ASCENDING AORTIC ROOT REPLACEMENT (N/A) - Using 53mm Gelweave Valsalva Graft TRANSESOPHAGEAL ECHOCARDIOGRAM (TEE) (N/A)  Patient Location: ICU  Anesthesia Type:General  Level of Consciousness: sedated, unresponsive and Patient remains intubated per anesthesia plan  Airway & Oxygen Therapy: Patient remains intubated per anesthesia plan and Patient placed on Ventilator (see vital sign flow sheet for setting)  Post-op Assessment: Report given to RN and Post -op Vital signs reviewed and stable  Post vital signs: Reviewed and stable  Last Vitals:  Vitals:   09/15/16 0600 09/15/16 1540  BP: 133/78 115/74  Pulse: (!) 58 99  Resp: 20 18  Temp: 36.9 C   SpO2: 100%     Last Pain:  Vitals:   09/15/16 0601  TempSrc:   PainSc: 3          Complications: No apparent anesthesia complications

## 2016-09-15 NOTE — Anesthesia Procedure Notes (Signed)
Procedure Name: Intubation Date/Time: 09/15/2016 7:51 AM Performed by: Garrison Columbus T Pre-anesthesia Checklist: Patient identified, Emergency Drugs available, Suction available and Patient being monitored Patient Re-evaluated:Patient Re-evaluated prior to induction Oxygen Delivery Method: Circle System Utilized Preoxygenation: Pre-oxygenation with 100% oxygen Induction Type: IV induction Ventilation: Mask ventilation without difficulty and Oral airway inserted - appropriate to patient size Laryngoscope Size: Sabra Heck and 2 Grade View: Grade I Tube type: Oral Tube size: 8.0 mm Number of attempts: 1 Airway Equipment and Method: Stylet and Oral airway Placement Confirmation: ETT inserted through vocal cords under direct vision,  positive ETCO2 and breath sounds checked- equal and bilateral Secured at: 23 cm Tube secured with: Tape Dental Injury: Teeth and Oropharynx as per pre-operative assessment

## 2016-09-15 NOTE — H&P (Signed)
[address]  [place] [address]  [phone]  Lonnie Hansen  [facility] Medical Record #[id number]  Date of Birth: 27-Jun-1950  Referring:PATERSON,DANIEL G, MD  Primary Care: [name], MD  Chief Complaint:  Aortic Insufficiency  History of Present Illness:  Patient has been followed in the office and worked up for avr /root replacement . When last seen he had active pustules in his scalp that were being treated. He completed a course of by mouth antibioticsand the area is completely healed . He has also completed dental extraction.  The patient was originally was having hip pain making it difficult for him to be active. He was seeing Dr. Ninfa Linden for consideration of right hip replacement. In preparation for this a chest x-ray was performed leading to a CT scan of the chest. The patient has a previous CT scan of the chest done during admission in 2012 for syncope. Since episode of syncope in 2012 he's had no repeat syncopal episodes, notes rare palpitations. He has no previous history of myocardial infarction. He has increasing SOB with activity.  His family history is significant for his father who had had a MI prior but "drop dead at home" at age 79 in 28. His mother died of lymphoma date 65 1 brother and 1 sister alive his brothers had a myocardial infarction in the past., He has no children. There is no other history can on Cerone calls her cousins of unexplained sudden death at a young age.  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  Current Activity/ Functional Status:  Patient is independent with mobility/ambulation, transfers, ADL's, IADL's. But does use a cane because of pain in the right hip  Zubrod Score:  At the time of surgery this patient's most appropriate activity status/level should be described as:  0 Normal activity, no symptoms  1 Restricted in physical  strenuous activity but ambulatory, able to do out light work  2 Ambulatory and capable of self care, unable to do work activities, up and about >50 % of waking hours  3 Only limited self care, in bed greater than 50% of waking hours  4 Completely disabled, no self care, confined to bed or chair  5 Moribund      Past Medical History:  Diagnosis Date  . Anemia   . Arthritis   . Asymptomatic bilateral carotid artery stenosis 08/2015   1-39%   . Blood dyscrasia    "trouble with my bloods clotting"  . Bruising    on the skin states due to platelets or sometimes low or high  . Cataracts, bilateral   . Coronary artery disease   . Diverticulosis   . Enlarged aorta ([facility])   . Enlarged prostate    slightly  . GERD (gastroesophageal reflux disease)    takes Pantoprazole daily as needed  . Glaucoma    uses eye drops daily  . Headache   . History of colon polyps    benign  . History of kidney stones   . Hyperlipidemia    no on any meds  . Hypertension    takes Amlodipine and Atenolol daily  . Joint pain   . Joint swelling   . Nocturia   . Sleep apnea   . Vocal cord nodule    pt. states it's a" growth on vocal cord"        Past Surgical  History:  Procedure Laterality Date  . AORTIC ARCH ANGIOGRAPHY N/A 04/16/2016   Procedure: Aortic Arch Angiography; Surgeon: [name] Martinique, MD; Location: [facility]; Service: Cardiovascular; Laterality: N/A;  . COLONOSCOPY    . COLONOSCOPY WITH ESOPHAGOGASTRODUODENOSCOPY (EGD)    . MULTIPLE EXTRACTIONS WITH ALVEOLOPLASTY N/A 06/10/2016   Procedure: Extraction of tooth #'s 2,8,13,15, and 29 with alveoloplasty, maxillary right and left buccal exostoses reductions, and gross debridement of remaining teeth.; Surgeon: Lenn Cal, DDS; Location: [facility]; Service: Oral Surgery; Laterality: N/A;  . RIGHT/LEFT HEART CATH AND CORONARY ANGIOGRAPHY N/A 04/16/2016   Procedure: Right/Left Heart Cath and Coronary Angiography; Surgeon: [name] Martinique,  MD; Location: [facility]; Service: Cardiovascular; Laterality: N/A;        Family History  Problem Relation Age of Onset  . Heart disease Father   . Heart attack Father   . Thyroid disease Sister   As noted above his father died suddenly at age 1, he had a previous history of myocardial infarction  Social History        Social History  . Marital Status: Married    Spouse Name: N/A  . Number of Children: N/A  . Years of Education: N/A      Occupational History  . Not on file.        Social History Main Topics  . Smoking status: Former Smoker -- 1.00 packs/day for 25 years    Types: Cigarettes  . Smokeless tobacco: Not on file  . Alcohol Use: No  . Drug Use: No  . Sexual Activity: Not on file       Other Topics Concern  . Not on file      Social History Narrative   Married.   He is a Optometrist.          History  Smoking Status  . Former Smoker  . Packs/day: 1.00  . Years: 25.00  . Types: Cigarettes  Smokeless Tobacco  . Never Used      History  Alcohol Use No       Allergies  Allergen Reactions  . No Known Allergies             Current Facility-Administered Medications  Medication Dose Route Frequency Provider Last Rate Last Dose  . cefUROXime (ZINACEF) 1.5 g in dextrose 5 % 50 mL IVPB 1.5 g Intravenous To OR Grace Isaac, MD    . cefUROXime (ZINACEF) 750 mg in dextrose 5 % 50 mL IVPB 750 mg Intravenous To OR Grace Isaac, MD    . chlorhexidine (HIBICLENS) 4 % liquid 2 application 30 mL Topical UD Grace Isaac, MD    . dexmedetomidine (PRECEDEX) 400 MCG/100ML (4 mcg/mL) infusion 0.1-0.7 mcg/kg/hr Intravenous To OR Grace Isaac, MD    . DOPamine (INTROPIN) 800 mg in dextrose 5 % 250 mL (3.2 mg/mL) infusion 0-10 mcg/kg/min Intravenous To OR Grace Isaac, MD    . EPINEPHrine (ADRENALIN) 4 mg in dextrose 5 % 250 mL (0.016 mg/mL) infusion 0-10 mcg/min Intravenous To OR Grace Isaac, MD    . heparin 2,500  Units, papaverine 30 mg in electrolyte-148 (PLASMALYTE-148) 500 mL irrigation  Irrigation To OR Grace Isaac, MD    . heparin 30,000 units/NS 1000 mL solution for CELLSAVER  Other To OR Grace Isaac, MD    . insulin regular (NOVOLIN R,HUMULIN R) 100 Units in sodium chloride 0.9 % 100 mL (1 Units/mL) infusion  Intravenous To  OR Grace Isaac, MD    . magnesium sulfate (IV Push/IM) injection 40 mEq 40 mEq Other To OR Grace Isaac, MD    . metoprolol tartrate (LOPRESSOR) tablet 12.5 mg 12.5 mg Oral Once Grace Isaac, MD    . nitroGLYCERIN 50 mg in dextrose 5 % 250 mL (0.2 mg/mL) infusion 2-200 mcg/min Intravenous To OR Grace Isaac, MD    . phenylephrine (NEO-SYNEPHRINE) 20 mg in sodium chloride 0.9 % 250 mL (0.08 mg/mL) infusion 30-200 mcg/min Intravenous To OR Grace Isaac, MD    . potassium chloride injection 80 mEq 80 mEq Other To OR Grace Isaac, MD    . tranexamic acid (CYKLOKAPRON) 2,500 mg in sodium chloride 0.9 % 250 mL (10 mg/mL) infusion 1.5 mg/kg/hr Intravenous To OR Grace Isaac, MD    . tranexamic acid (CYKLOKAPRON) bolus via infusion - over 30 minutes 1,389 mg 15 mg/kg Intravenous To OR Grace Isaac, MD    . tranexamic acid (CYKLOKAPRON) pump prime solution 185 mg 2 mg/kg Intracatheter To OR Grace Isaac, MD    . vancomycin (VANCOCIN) 1,500 mg in sodium chloride 0.9 % 250 mL IVPB 1,500 mg Intravenous To OR Grace Isaac, MD              Facility-Administered Medications Ordered in Other Encounters  Medication Dose Route Frequency Provider Last Rate Last Dose  . fentaNYL (SUBLIMAZE) injection   Anesthesia Intra-op Harden Mo, CRNA  50 mcg at 09/15/16 0645  . lactated ringers infusion   Continuous PRN Harden Mo, CRNA    . lactated ringers infusion   Continuous PRN Harden Mo, CRNA    . lactated ringers infusion   Continuous PRN Harden Mo, CRNA    . midazolam (VERSED) 5 MG/5ML injection  Intravenous  Anesthesia Intra-op Harden Mo, CRNA  1 mg at 09/15/16 5361   Review of Systems:  Cardiac Review of Systems: Y or N  Chest Pain [ N ] Resting SOB [ N Exertional SOB [Y] Orthopnea [ N ]  Pedal Edema Aqua.Slicker ] Palpitations Aqua.Slicker ] Syncope [ once 2012 ] Presyncope Aqua.Slicker ]  General Review of Systems: [Y] = yes [ ] =no  Constitional: recent weight change [ gain ]; Wt loss over the last 3 months [ ]  anorexia [ ] ; fatigue [ y ]; nausea [ n ]; night sweats [ n ]; fever [n]; or chills [ n]; Dental: poor dentition[ n]; Last Dentist visit: DENTAL EXTRACTION DONE  Eye : blurred vision [ N ]; diplopia [ ] ; vision changes Aqua.Slicker ]; Amaurosis fugax[N ];  Resp: cough [n ]; wheezing[ n ]; hemoptysis[ n ]; shortness of breath[ ] ; paroxysmal nocturnal dyspnea[ ] ; dyspnea on exertion[ ] ; or orthopnea[ ] ;  GI: gallstones[ ] , vomiting[N ]; dysphagia[N ]; melena[ ] ; hematochezia [ ] ; heartburn[ N ]; Hx of Colonoscopy[ y last year ];  GU: kidney stones [ ] ; hematuria[ ] ; dysuria [ ] ; nocturia[ ] ; history of obstruction [ ] ; urinary frequency [ ]   Skin: rash, swelling[ ] ;, hair loss[ ] ; peripheral edema[ ] ; or itching[ ] ;  Musculosketetal: myalgias[ ] ; joint swelling[ ] ; joint erythema[ ] ;  joint pain[ ] ; back pain[ ] ;  Heme/Lymph: bruising[ ] ; bleeding[ ] ; anemia[ ] ;  Neuro: TIA[ N ]; headaches[N ]; stroke[ N ]; vertigo[ N ]; seizures[ n ]; paresthesias[ ] ; difficulty walking[ y ];  Psych:depression[ ] ; anxiety[ ] ;  Endocrine: diabetes[ n ]; thyroid dysfunction[n ];  Immunizations: Flu up to  date [ y]; Pneumococcal up to date [ n ];  Other:  Physical Exam:  BP 133/78  Pulse (!) 58  Temp 98.4 F (36.9 C) (Oral)  Resp 20  SpO2 100%  PHYSICAL EXAMINATION:  Physical Exam  Constitutional: He is oriented to person, place, and time. He appears well-developed and well-nourished. No distress.  HENT:  Mouth/Throat: No oropharyngeal exudate.  Eyes: Left eye exhibits no discharge.  Neck: No tracheal deviation present. No  thyromegaly present.  Cardiovascular: Regular rhythm. Exam reveals no gallop and no friction rub.  Murmur heard.  Respiratory: No stridor. No respiratory distress. He has no wheezes. He has no rales. He exhibits no tenderness.  GI: He exhibits no distension and no mass. There is no tenderness. There is no rebound and no guarding.  Musculoskeletal: He exhibits no edema, tenderness or deformity.  Lymphadenopathy:  He has no cervical adenopathy.  Neurological: He is alert and oriented to person, place, and time.  Skin: Skin is warm and dry. He is not diaphoretic. No erythema.  Psychiatric: He has a normal mood and affect. His behavior is normal. Judgment and thought content normal.  Murmur of aortic insufficiency is noted. The area of previous pustule on the right posterior scalp is completely healed  Has palpable DP and PT pulses bilaterally  Diagnostic Studies & Laboratory data:  Recent Radiology Findings:  Dg Chest 2 View  Result Date: 09/11/2016  CLINICAL DATA: Thoracic aortic aneurysm. EXAM: CHEST 2 VIEW COMPARISON: CT 03/26/2016. FINDINGS: Mediastinum and hilar structures normal. Heart size normal. Thoracic aortic aneurysm again noted. No interim change. No acute pulmonary disease. No pleural effusion or pneumothorax . Thoracic spine scoliosis . IMPRESSION: Thoracic aortic aneurysm again noted without interim change. No acute pulmonary disease. Electronically Signed By: Marcello Moores Register On: 09/11/2016 13:33   Ct Angio Chest Aorta W/cm &/or Wo/cm  Addendum Date: 03/29/2016  ADDENDUM REPORT: 03/29/2016 14:08 ADDENDUM: The aortic root at the level of the sinuses of Valsalva was remeasured at approximately 4.9- 5.2 cm in greatest diameter depending on axis of measurement. This appears stable compared to the prior coronary CTA on 08/28/2015. Electronically Signed By: Aletta Edouard M.D. On: 03/29/2016 14:08  Ct Angio Chest Aorta W/cm &/or Wo/cm  Result Date: 03/26/2016  CLINICAL DATA: Thoracic aortic  aneurysm without rupture. EXAM: CT ANGIOGRAPHY CHEST WITH CONTRAST TECHNIQUE: Multidetector CT imaging of the chest was performed using the standard protocol during bolus administration of intravenous contrast. Multiplanar CT image reconstructions and MIPs were obtained to evaluate the vascular anatomy. CONTRAST: 75 mL of Isovue 370 intravenously. COMPARISON: CT scan of July 24, 2015. FINDINGS: Cardiovascular: There is no evidence of thoracic aortic dissection. Great vessels are widely patent without significant stenosis. Aneurysmal aortic root is noted measured at 5.6 cm. Proximal ascending thoracic aorta measures 4.1 cm in diameter. Transverse aortic arch measures 4.0 cm. Descending thoracic aorta has maximum measured diameter of 3.9 cm. Coronary artery calcifications are noted. Mediastinum/Nodes: No enlarged mediastinal, hilar, or axillary lymph nodes. Thyroid gland, trachea, and esophagus demonstrate no significant findings. Lungs/Pleura: Lungs are clear. No pleural effusion or pneumothorax. Upper Abdomen: Large gallstone is noted. Stable partially calcified left renal cyst is noted. Musculoskeletal: No chest wall abnormality. No acute or significant osseous findings. Review of the MIP images confirms the above findings. IMPRESSION: Stable aneurysmal dilatation of aortic root is noted at 5.6 cm. 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 Recommend annual imaging followup by CTA or MRA. This recommendation follows 2010 ACCF/AHA/AATS/ACR/ASA/SCA/SCAI/SIR/STS/SVM Guidelines for  the Diagnosis and Management of Patients with Thoracic Aortic Disease. Circulation. 2010; 121: P379-K240. Coronary artery calcifications are noted suggesting coronary disease. Large gallstone is noted. Electronically Signed By: Marijo Conception, M.D. On: 03/26/2016 15:37  I have independently reviewed the above radiology studies and reviewed the findings with the patient. I've also discussed the CT findings with Dr. Nyoka Cowden , as  the measurements I get both on axial coronal and sagittal cuts measures the aortic root at approximate 5.2  Ct Coronary Morph W/cta Cor W/score W/ca W/cm &/or Wo/cm  Addendum Date: 08/28/2015  ADDENDUM REPORT: 08/28/2015 13:09 CLINICAL DATA: Aortic Aneurysm EXAM: Cardiac CT TECHNIQUE: The patient was scanned on a Philips 256 scanner. A 120 kV retrospective scan was triggered in the descending thoracic aorta at 111 HU's. Gantry rotation speed was 270 msecs and collimation was .9 mm. No beta blockade or nitro were given. The 3D data set was reconstructed in 5% intervals of the R-R cycle. Systolic and diastolic phases were analyzed on a dedicated work station using MPR, MIP and VRT modes. The patient received 80 cc of contrast. FINDINGS: Aortic Valve: Trileaflet without significant calcium Aorta: Fusiform dilatation of the ascending aorta widest at the sinus level. Bovine Arch with no coarctation. Isolated area of calcification at the isthmus with mild aneurysmal dilatation in this area as well Orthogonal double oblique measurements as follows Sinotubular Junction: 40 mm Ascending Thoracic Aorta: 42 mm Aortic Arch: 40 mm Isthmus: 40 mm Descending Thoracic Aorta: 27 mm Sinus of Valsalva Measurements: Non-coronary: 45 mm Right coronary: 46 mm Left coronary: 48 mm : 1) Trileaflet aortic valve with no significant calcification 2) Fusiform dilatation of the aortic sinuses maximum measurement in coronal view 51 mm. However anatomic short axis Views show largest diameter of 48 mm for left coronary sinus 3) Tortuous left subclavian vein 4) Bovine Arch 5) Mild dilatation of the aortic root, arch and isthmus 6) Normal descending thoracic aorta with no coarctation Jenkins Rouge Electronically Signed By: Jenkins Rouge M.D. On: 08/28/2015 13:09  Result Date: 08/28/2015  EXAM: OVER-READ INTERPRETATION CT CHEST The following report is an over-read performed by radiologist Dr. Rebekah Chesterfield [facility] on 08/28/2015.  This over-read does not include interpretation of cardiac or coronary anatomy or pathology. The coronary calcium score/coronary CTA interpretation by the cardiologist is attached. COMPARISON: Chest CT 07/24/2015. FINDINGS: Aortic atherosclerosis. Mild aneurysmal dilatation of the ascending thoracic aorta and the arch, both of which measure 4.5 cm in diameter. The isthmus is also aneurysmal measuring 4.3 cm in diameter. The remainder the descending thoracic aorta is otherwise normal in caliber. Aortic root dilatation (to be described separately). No evidence of thoracic aortic dissection. Within the visualized portions of the thorax there are no suspicious appearing pulmonary nodules or masses, there is no acute consolidative airspace disease, no pleural effusions, no pneumothorax and no lymphadenopathy. Visualized portions of the upper abdomen are unremarkable. There are no aggressive appearing lytic or blastic lesions noted in the visualized portions of the skeleton. IMPRESSION: 1. No acute incidental noncardiac findings are noted. 2. Aneurysmal dilatation of the ascending thoracic aorta, aortic arch and isthmus, as above. No thoracic aortic dissection at this time. Recommend semi-annual imaging followup by CTA or MRA. This recommendation follows 2010 ACCF/AHA/AATS/ACR/ASA/SCA/SCAI/SIR/STS/SVM Guidelines for the Diagnosis and Management of Patients With Thoracic Aortic Disease. Circulation. 2010; 121: X735-H299. Electronically Signed: By: Vinnie Langton M.D. On: 08/28/2015 10:59  Ct Chest W Contrast  07/24/2015 CLINICAL DATA: Ectatic thoracic aorta on recent chest radiograph EXAM: CT  CHEST WITH CONTRAST TECHNIQUE: Multidetector CT imaging of the chest was performed during intravenous contrast administration. CONTRAST: 26mL ISOVUE-300 IOPAMIDOL (ISOVUE-300) INJECTION 61% COMPARISON: Most recent available chest CT April 27, 2010 FINDINGS: Mediastinum/Lymph Nodes: There is dilatation of the ascending thoracic aorta.  Just beyond the aortic valve, the ascending thoracic aorta measures 6.1 x 4.9 cm, stable from prior study. At the level of the proximal right main pulmonary artery, the thoracic aorta has a maximum transverse diameter of 4.6 x 4.1 cm, essentially stable from prior study. At the level of the aortic arch, the transverse diameter is 4.6 cm, stable. There is no thoracic aortic dissection. Visualized great vessels appear somewhat tortuous but otherwise unremarkable. There is no major vessel pulmonary embolus. There are multiple foci of coronary artery calcification. There is no appreciable pericardial thickening. Thyroid appears unremarkable. There is no appreciable thoracic adenopathy. Lungs/Pleura: There is slight scarring in the lung bases. There is no parenchymal lung edema or consolidation. Upper abdomen: In the visualized upper abdomen, there is cholelithiasis. Gallbladder wall does not appear appreciably thickened. There is a complex cyst in the upper left kidney containing calcification, unchanged compared to prior study. There is a parapelvic cyst on the right, incompletely visualized, measuring 4.2 x 3.5 cm. Musculoskeletal: There are no blastic or lytic bone lesions. IMPRESSION: Thoracic aortic prominence as noted above. Prominence is greatest just beyond the aortic valve. Question a degree of aortic valve disease. Recommend semi-annual imaging followup by CTA or MRA and referral to cardiothoracic surgery if not already obtained. This recommendation follows 2010 ACCF/AHA/AATS/ACR/ASA/SCA/SCAI/SIR/STS/SVM Guidelines for the Diagnosis and Management of Patients With Thoracic Aortic Disease. Circulation. 2010; 121: [id number]. Note that this aortic prominence is essentially stable compared to 2012 study. There is no periaortic fluid. No thoracic aortic dissection noted. No pericardial effusion. There are multiple areas of coronary artery calcification. No parenchymal lung edema or consolidation. No parenchymal  lung mass or adenopathy. Cholelithiasis. Stable cystic lesion containing multiple calcifications in the upper pole left kidney. Parapelvic cyst right kidney. Electronically Signed By: Lowella Grip III M.D. On: 07/24/2015 11:00  I have independently reviewed the above radiology studies and reviewed the findings with the patient.  ECHO: Echocardiography  Patient: Lonnie Hansen, Lonnie Hansen  MR #: [id number]  Study Date: 09/20/2015  Gender: M  Age: 66  Height: 175.3 cm  Weight: 91.6 kg  BSA: 2.14 m^2  Pt. Status:  Room:  [facility] MD  [place] MD  REFERRING [name] [id number]  ATTENDING Candee Furbish, M.D.  SONOGRAPHER Cindy Hazy, RDCS  PERFORMING [facility], Outpatient  cc:  -------------------------------------------------------------------  LV EF: 60% - 65%  -------------------------------------------------------------------  Indications: I35.1 Aortic Insufficiency. I71.2 Ascending  Aortic Aneurysm.  -------------------------------------------------------------------  History: PMH: Acquired from the patient and from the patient&'s  chart. PMH: Dilated Thoracic Aorta. Risk factors: Hypertension.  Dyslipidemia.  -------------------------------------------------------------------  Study Conclusions  - Left ventricle: The cavity size was normal. There was moderate  focal basal hypertrophy of the septum. Systolic function was  normal. The estimated ejection fraction was in the range of 60%  to 65%. Wall motion was normal; there were no regional wall  motion abnormalities. Doppler parameters are consistent with  abnormal left ventricular relaxation (grade 1 diastolic  dysfunction).  - Aortic valve: There was moderate to severe regurgitation.  - Aorta: Aortic root dimension: 43 mm (ED).  - Ascending aorta: The ascending aorta was mildly dilated.  - Right ventricle: The cavity size was mildly dilated. Wall  thickness was normal.    -------------------------------------------------------------------  Study data: Study status: Routine. Procedure: The patient  reported no pain pre or post test. Transthoracic echocardiography  for left ventricular function evaluation, for right ventricular  function evaluation, and for assessment of valvular function. Image  quality was adequate. Study completion: There were no  complications. Echocardiography. M-mode, complete 2D,  spectral Doppler, and color Doppler. Birthdate: Patient  birthdate: 02-Dec-1950. Age: Patient is 66 yr old. Sex: Gender:  male. BMI: 29.8 kg/m^2. Blood pressure: 136/74 Patient  status: Outpatient. Study date: Study date: 09/20/2015. Study  time: 10:58 AM. Location: [facility]  -------------------------------------------------------------------  -------------------------------------------------------------------  Left ventricle: The cavity size was normal. There was moderate  focal basal hypertrophy of the septum. Systolic function was  normal. The estimated ejection fraction was in the range of 60% to  65%. Wall motion was normal; there were no regional wall motion  abnormalities. Doppler parameters are consistent with abnormal left  ventricular relaxation (grade 1 diastolic dysfunction).  -------------------------------------------------------------------  Aortic valve: Trileaflet; normal thickness leaflets. Mobility was  not restricted. Doppler: Transvalvular velocity was within the  normal range. There was no stenosis. There was moderate to severe  regurgitation.  -------------------------------------------------------------------  Aorta: Ascending aorta: The ascending aorta was mildly dilated.  -------------------------------------------------------------------  Mitral valve: Structurally normal valve. Mobility was not  restricted. Doppler: Transvalvular velocity was within the normal  range. There was no evidence for stenosis. There was no   regurgitation.  -------------------------------------------------------------------  Left atrium: The atrium was normal in size.  -------------------------------------------------------------------  Right ventricle: The cavity size was mildly dilated. Wall  thickness was normal. Systolic function was normal.  -------------------------------------------------------------------  Pulmonic valve: Doppler: Transvalvular velocity was within the  normal range. There was no evidence for stenosis.  -------------------------------------------------------------------  Tricuspid valve: Structurally normal valve. Doppler:  Transvalvular velocity was within the normal range. There was  trivial regurgitation.  -------------------------------------------------------------------  Pulmonary artery: The main pulmonary artery was normal-sized.  Systolic pressure was within the normal range.  -------------------------------------------------------------------  Right atrium: The atrium was normal in size.  -------------------------------------------------------------------  Pericardium: There was no pericardial effusion.  -------------------------------------------------------------------  Systemic veins:  Inferior vena cava: The vessel was normal in size.  -------------------------------------------------------------------  Measurements  Left ventricle Value Reference  LV ID, ED, PLAX chordal 50.9 mm 43 - 52  LV ID, ES, PLAX chordal 34.6 mm 23 - 38  LV fx shortening, PLAX chordal 32 % >=29  LV PW thickness, ED 10.9 mm ---------  IVS/LV PW ratio, ED (H) 1.39 <=1.3  Stroke volume, 2D 124 ml ---------  Stroke volume/bsa, 2D 58 ml/m^2 ---------  LV e&', lateral 5.26 cm/s ---------  LV E/e&', lateral 7.7 ---------  LV e&', medial 5.55 cm/s ---------  LV E/e&', medial 7.3 ---------  LV e&', average 5.41 cm/s ---------  LV E/e&', average 7.49 ---------  Ventricular septum Value Reference  IVS  thickness, ED 15.2 mm ---------  LVOT Value Reference  LVOT ID, S 30 mm ---------  LVOT area 7.07 cm^2 ---------  LVOT ID 30 mm ---------  LVOT peak velocity, S 76.7 cm/s ---------  LVOT mean velocity, S 53.7 cm/s ---------  LVOT VTI, S 17.5 cm ---------  LVOT peak gradient, S 2 mm Hg ---------  Stroke volume (SV), LVOT DP 123.7 ml ---------  Stroke index (SV/bsa), LVOT DP 57.9 ml/m^2 ---------  Aortic valve Value Reference  Aortic regurg pressure half-time 916 ms ---------  Aorta Value Reference  Aortic root ID, ED 43 mm ---------  Left atrium Value Reference  LA ID, A-P,  ES 33 mm ---------  LA ID/bsa, A-P 1.54 cm/m^2 <=2.2  LA volume, S 37 ml ---------  LA volume/bsa, S 17.3 ml/m^2 ---------  LA volume, ES, 1-p A4C 31 ml ---------  LA volume/bsa, ES, 1-p A4C 14.5 ml/m^2 ---------  LA volume, ES, 1-p A2C 44 ml ---------  LA volume/bsa, ES, 1-p A2C 20.6 ml/m^2 ---------  Mitral valve Value Reference  Mitral E-wave peak velocity 40.5 cm/s ---------  Mitral A-wave peak velocity 57.8 cm/s ---------  Mitral deceleration time (H) 257 ms 150 - 230  Mitral E/A ratio, peak 0.7 ---------  Tricuspid valve Value Reference  Tricuspid regurg peak velocity 251 cm/s ---------  Tricuspid peak RV-RA gradient 25 mm Hg ---------  Right ventricle Value Reference  RV s&', lateral, S 18 cm/s ---------  Legend:  (L) and (H) mark values outside specified reference range.  -------------------------------------------------------------------  Prepared and Electronically Authenticated by  Candee Furbish, M.D.  2017-08-25T13:27:13  There is moderate to severe regurgitation On echo  EF 60- 65%  LV ID, ED, PLAX chordal 50.9 mm 43 - 52  LV ID, ES, PLAX chordal 34.6 mm 23 - 38  Cath:  Procedures  Aortic Arch Angiography  Right/Left Heart Cath and Coronary Angiography  Conclusion  Mid RCA lesion, 80 %stenosed.  Ost LAD to Prox LAD lesion, 25 %stenosed.  Dist LAD lesion, 50 %stenosed.  There is mild  left ventricular systolic dysfunction.  LV end diastolic pressure is normal.  The left ventricular ejection fraction is 45-50% by visual estimate.  There is no mitral valve regurgitation.  There is no aortic valve stenosis.  LV end diastolic pressure is normal. 1. 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 Mild LV dysfunction. EF 45%.  3. Ascending thoracic aortic aneurysm.  4. 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  5. Normal Right heart And LV filling pressures  6. Normal cardiac output.  Plan: Aortic root grafting and AVR.   I have independently reviewed the above cath films and reviewed the findings with the patient .  Recent Lab Findings:  Recent Labs                                                                                                                                                    Aortic Size Index= 5.2 /There is no height or weight on file to calculate BSA. = 2.5  < 2.75 cm/m2 4% risk per year  2.75 to 4.25 8% risk per year  > 4.25 cm/m2 20% risk per year  The [employer] [facility]) and the [place] (AHA) have issued a statement to clarify 2 previous guidelines from the [facility] [place], and collaborating societies addressing the risk of aortic dissection in patients with bicuspid aortic valves (BAV) and severe aortic enlargement. The 2 guidelines differ with regard to the recommended  threshold of aortic root or ascending aortic dilatation that would justify surgical intervention in patients with bicuspid aortic valves. This new statement of clarification uses the ACC/AHA revised structure for delineating the Class of Recommendation and Level of Evidence to provide recommendation that replace those contained in Section 9.2.2.1 of the thoracic aortic disease guidelines and Section 5.1.3 of the valvular heart disease guideline. New recommendations in intervention in patients with BAV and dilatation of the aortic root  (sinuses) or ascending aorta include:  . Operative intervention to repair or replace the aortic root (sinuses) or replace the ascending aorta is indicated in asymptomatic patients with BAV if the diameter of the aortic root or ascending aorta is 5.5 cm or greater. (Class of recommendation 1, Level of evidence B-NR).  Marland Kitchen Operative intervention to repair or replace the aortic root (sinuses) or replace the ascending aorta is reasonable in asymptomatic patients with BAV if the diameter of the aortic root or ascending aorta is 5.0 cm or greater and an additional risk factor for dissection is present or if the patient is at low surgical risk and the surgery is performed by an experienced aortic surgical team in a center with established expertise in these procedures. (Class of recommendation IIa; Level of Evidence B-NR).  . Replacement of the ascending aorta is reasonable in patients with BAV undergoing AVR because of severe aortic stenosis or aortic regurgitation when the diameter of the ascending aorta is greater than 4.5 cm (Class of recommendation IIa; Level of evidence C-EO).  Citation: Donnamarie Poag, Randolm Idol, et al. Surgery for aortic dilatation in patients with bicuspid aortic valves. A statement of clarification from the [employer]/American Heart Association Task Force on Clinical Practice Guidelines. [Published online ahead of print December 29, 2013]. Circulation. doi: 10.1161/CIR.0000000000000331.  Assessment / Plan:  Dilated ascending aorta and aortic root, with 3 + AI on echo and cath.  Root replacement Aug 13  Now with the patient's scalp infection cleared I recommended that we proceed with the planned aortic root and ascending aorta replacement  Risks and options have been discussed with patient in detail, and addition we have discussed possible need for permanent pacemaker, we have discussed mechanical versus tissue valve replacement, the patient prefers tissue  valve.  The goals risks and alternatives of the planned surgical procedure Procedure(s):  AORTIC VALVE REPLACEMENT (AVR) (N/A)  CORONARY ARTERY BYPASS GRAFTING (CABG) (N/A)  ASCENDING AORTIC ROOT REPLACEMENT (N/A)  TRANSESOPHAGEAL ECHOCARDIOGRAM (TEE) (N/A) have been discussed with the patient in detail. The risks of the procedure including death, infection, stroke, myocardial infarction, bleeding, blood transfusion, heart block have all been discussed specifically. I have quoted Lonnie Hansen a 5 % of perioperative mortality and a complication rate as high as 40 %. The patient's questions have been answered.Lonnie Hansen is willing to proceed with the planned procedure.  Grace Isaac MD   [address]  [place] [address]  Office [place] [phone]  09/15/2016 7:15 AM    This is copy of note done preop today and mislabeled as progress note

## 2016-09-16 ENCOUNTER — Inpatient Hospital Stay (HOSPITAL_COMMUNITY): Payer: Medicare Other

## 2016-09-16 ENCOUNTER — Encounter (HOSPITAL_COMMUNITY): Payer: Self-pay | Admitting: Cardiothoracic Surgery

## 2016-09-16 DIAGNOSIS — I361 Nonrheumatic tricuspid (valve) insufficiency: Secondary | ICD-10-CM

## 2016-09-16 DIAGNOSIS — I371 Nonrheumatic pulmonary valve insufficiency: Secondary | ICD-10-CM

## 2016-09-16 LAB — GLUCOSE, CAPILLARY
Glucose-Capillary: 103 mg/dL — ABNORMAL HIGH (ref 65–99)
Glucose-Capillary: 107 mg/dL — ABNORMAL HIGH (ref 65–99)
Glucose-Capillary: 107 mg/dL — ABNORMAL HIGH (ref 65–99)
Glucose-Capillary: 109 mg/dL — ABNORMAL HIGH (ref 65–99)
Glucose-Capillary: 114 mg/dL — ABNORMAL HIGH (ref 65–99)
Glucose-Capillary: 118 mg/dL — ABNORMAL HIGH (ref 65–99)
Glucose-Capillary: 121 mg/dL — ABNORMAL HIGH (ref 65–99)
Glucose-Capillary: 142 mg/dL — ABNORMAL HIGH (ref 65–99)
Glucose-Capillary: 153 mg/dL — ABNORMAL HIGH (ref 65–99)
Glucose-Capillary: 170 mg/dL — ABNORMAL HIGH (ref 65–99)
Glucose-Capillary: 174 mg/dL — ABNORMAL HIGH (ref 65–99)
Glucose-Capillary: 179 mg/dL — ABNORMAL HIGH (ref 65–99)
Glucose-Capillary: 191 mg/dL — ABNORMAL HIGH (ref 65–99)
Glucose-Capillary: 206 mg/dL — ABNORMAL HIGH (ref 65–99)
Glucose-Capillary: 209 mg/dL — ABNORMAL HIGH (ref 65–99)
Glucose-Capillary: 228 mg/dL — ABNORMAL HIGH (ref 65–99)
Glucose-Capillary: 246 mg/dL — ABNORMAL HIGH (ref 65–99)
Glucose-Capillary: 81 mg/dL (ref 65–99)
Glucose-Capillary: 83 mg/dL (ref 65–99)
Glucose-Capillary: 89 mg/dL (ref 65–99)
Glucose-Capillary: 90 mg/dL (ref 65–99)
Glucose-Capillary: 95 mg/dL (ref 65–99)
Glucose-Capillary: 98 mg/dL (ref 65–99)

## 2016-09-16 LAB — COOXEMETRY PANEL
Carboxyhemoglobin: 1.3 % (ref 0.5–1.5)
Carboxyhemoglobin: 1.3 % (ref 0.5–1.5)
Carboxyhemoglobin: 0.9 % (ref 0.5–1.5)
Carboxyhemoglobin: 0.8 % (ref 0.5–1.5)
Methemoglobin: 1.1 % (ref 0.0–1.5)
Methemoglobin: 0.8 % (ref 0.0–1.5)
Methemoglobin: 1.4 % (ref 0.0–1.5)
Methemoglobin: 1.2 % (ref 0.0–1.5)
O2 Saturation: 52.6 %
O2 Saturation: 65.8 %
O2 Saturation: 59.1 %
O2 Saturation: 44.9 %
Total hemoglobin: 7.9 g/dL — ABNORMAL LOW (ref 12.0–16.0)
Total hemoglobin: 7.4 g/dL — ABNORMAL LOW (ref 12.0–16.0)
Total hemoglobin: 7.8 g/dL — ABNORMAL LOW (ref 12.0–16.0)
Total hemoglobin: 7.6 g/dL — ABNORMAL LOW (ref 12.0–16.0)

## 2016-09-16 LAB — PREPARE CRYOPRECIPITATE
Unit division: 0
Unit division: 0
Unit division: 0

## 2016-09-16 LAB — BASIC METABOLIC PANEL
ANION GAP: 7 (ref 5–15)
Anion gap: 7 (ref 5–15)
Anion gap: 8 (ref 5–15)
Anion gap: 8 (ref 5–15)
BUN: 13 mg/dL (ref 6–20)
BUN: 16 mg/dL (ref 6–20)
BUN: 19 mg/dL (ref 6–20)
BUN: 19 mg/dL (ref 6–20)
CHLORIDE: 113 mmol/L — AB (ref 101–111)
CO2: 21 mmol/L — ABNORMAL LOW (ref 22–32)
CO2: 22 mmol/L (ref 22–32)
CO2: 25 mmol/L (ref 22–32)
CO2: 24 mmol/L (ref 22–32)
Calcium: 7.1 mg/dL — ABNORMAL LOW (ref 8.9–10.3)
Calcium: 7.4 mg/dL — ABNORMAL LOW (ref 8.9–10.3)
Calcium: 8 mg/dL — ABNORMAL LOW (ref 8.9–10.3)
Calcium: 8 mg/dL — ABNORMAL LOW (ref 8.9–10.3)
Chloride: 106 mmol/L (ref 101–111)
Chloride: 108 mmol/L (ref 101–111)
Chloride: 106 mmol/L (ref 101–111)
Creatinine, Ser: 1.04 mg/dL (ref 0.61–1.24)
Creatinine, Ser: 1.26 mg/dL — ABNORMAL HIGH (ref 0.61–1.24)
Creatinine, Ser: 1.34 mg/dL — ABNORMAL HIGH (ref 0.61–1.24)
Creatinine, Ser: 1.24 mg/dL (ref 0.61–1.24)
GFR calc Af Amer: >60 mL/min (ref >60)
GFR calc Af Amer: >60 mL/min (ref >60)
GFR calc Af Amer: >60 mL/min (ref >60)
GFR calc non Af Amer: 54 mL/min — ABNORMAL LOW (ref >60)
GFR calc non Af Amer: 58 mL/min — ABNORMAL LOW (ref >60)
GFR calc non Af Amer: 59 mL/min — ABNORMAL LOW (ref >60)
Glucose, Bld: 166 mg/dL — ABNORMAL HIGH (ref 65–99)
Glucose, Bld: 121 mg/dL — ABNORMAL HIGH (ref 65–99)
Glucose, Bld: 97 mg/dL (ref 65–99)
Glucose, Bld: 87 mg/dL (ref 65–99)
POTASSIUM: 3.2 mmol/L — AB (ref 3.5–5.1)
Potassium: 4.1 mmol/L (ref 3.5–5.1)
Potassium: 4.3 mmol/L (ref 3.5–5.1)
Potassium: 4.2 mmol/L (ref 3.5–5.1)
SODIUM: 141 mmol/L (ref 135–145)
Sodium: 138 mmol/L (ref 135–145)
Sodium: 138 mmol/L (ref 135–145)
Sodium: 138 mmol/L (ref 135–145)

## 2016-09-16 LAB — CBC
HCT: 20.8 % — ABNORMAL LOW (ref 39.0–52.0)
HCT: 21.2 % — ABNORMAL LOW (ref 39.0–52.0)
HCT: 22 % — ABNORMAL LOW (ref 39.0–52.0)
HCT: 22.7 % — ABNORMAL LOW (ref 39.0–52.0)
HCT: 26 % — ABNORMAL LOW (ref 39.0–52.0)
Hemoglobin: 7.3 g/dL — ABNORMAL LOW (ref 13.0–17.0)
Hemoglobin: 7.5 g/dL — ABNORMAL LOW (ref 13.0–17.0)
Hemoglobin: 7.7 g/dL — ABNORMAL LOW (ref 13.0–17.0)
Hemoglobin: 8 g/dL — ABNORMAL LOW (ref 13.0–17.0)
Hemoglobin: 8.8 g/dL — ABNORMAL LOW (ref 13.0–17.0)
MCH: 29.1 pg (ref 26.0–34.0)
MCH: 29.8 pg (ref 26.0–34.0)
MCH: 30 pg (ref 26.0–34.0)
MCH: 30.1 pg (ref 26.0–34.0)
MCH: 30.4 pg (ref 26.0–34.0)
MCHC: 33.5 g/dL (ref 30.0–36.0)
MCHC: 35 g/dL (ref 30.0–36.0)
MCHC: 35.1 g/dL (ref 30.0–36.0)
MCHC: 35.2 g/dL (ref 30.0–36.0)
MCHC: 35.4 g/dL (ref 30.0–36.0)
MCV: 84.8 fL (ref 78.0–100.0)
MCV: 84.9 fL (ref 78.0–100.0)
MCV: 85.3 fL (ref 78.0–100.0)
MCV: 87 fL (ref 78.0–100.0)
MCV: 87 fL (ref 78.0–100.0)
PLATELETS: 81 10*3/uL — AB (ref 150–400)
Platelets: 124 10*3/uL — ABNORMAL LOW (ref 150–400)
Platelets: 125 10*3/uL — ABNORMAL LOW (ref 150–400)
Platelets: 84 10*3/uL — ABNORMAL LOW (ref 150–400)
Platelets: 92 10*3/uL — ABNORMAL LOW (ref 150–400)
RBC: 2.45 MIL/uL — ABNORMAL LOW (ref 4.22–5.81)
RBC: 2.5 MIL/uL — ABNORMAL LOW (ref 4.22–5.81)
RBC: 2.53 MIL/uL — ABNORMAL LOW (ref 4.22–5.81)
RBC: 2.66 MIL/uL — ABNORMAL LOW (ref 4.22–5.81)
RBC: 2.99 MIL/uL — ABNORMAL LOW (ref 4.22–5.81)
RDW: 14 % (ref 11.5–15.5)
RDW: 14.2 % (ref 11.5–15.5)
RDW: 14.4 % (ref 11.5–15.5)
RDW: 14.6 % (ref 11.5–15.5)
RDW: 14.6 % (ref 11.5–15.5)
WBC: 11.4 10*3/uL — AB (ref 4.0–10.5)
WBC: 11.9 10*3/uL — ABNORMAL HIGH (ref 4.0–10.5)
WBC: 7.4 10*3/uL (ref 4.0–10.5)
WBC: 8.7 10*3/uL (ref 4.0–10.5)
WBC: 9.5 10*3/uL (ref 4.0–10.5)

## 2016-09-16 LAB — DIC (DISSEMINATED INTRAVASCULAR COAGULATION)PANEL
D-Dimer, Quant: 5.91 ug/mL-FEU — ABNORMAL HIGH (ref 0.00–0.50)
Fibrinogen: 315 mg/dL (ref 210–475)
INR: 1.35
Platelets: 135 10*3/uL — ABNORMAL LOW (ref 150–400)
Prothrombin Time: 16.8 seconds — ABNORMAL HIGH (ref 11.4–15.2)
Smear Review: NONE SEEN
aPTT: 33 seconds (ref 24–36)

## 2016-09-16 LAB — POCT I-STAT 3, ART BLOOD GAS (G3+)
Acid-Base Excess: 4 mmol/L — ABNORMAL HIGH (ref 0.0–2.0)
Acid-base deficit: 7 mmol/L — ABNORMAL HIGH (ref 0.0–2.0)
Acid-base deficit: 5 mmol/L — ABNORMAL HIGH (ref 0.0–2.0)
Acid-base deficit: 1 mmol/L (ref 0.0–2.0)
Bicarbonate: 19 mmol/L — ABNORMAL LOW (ref 20.0–28.0)
Bicarbonate: 19.4 mmol/L — ABNORMAL LOW (ref 20.0–28.0)
Bicarbonate: 23.5 mmol/L (ref 20.0–28.0)
Bicarbonate: 27.9 mmol/L (ref 20.0–28.0)
O2 Saturation: 99 %
O2 Saturation: 99 %
O2 Saturation: 98 %
O2 Saturation: 100 %
Patient temperature: 37.5
Patient temperature: 37.3
Patient temperature: 37.2
Patient temperature: 37.8
TCO2: 20 mmol/L (ref 0–100)
TCO2: 20 mmol/L (ref 0–100)
TCO2: 25 mmol/L (ref 0–100)
TCO2: 29 mmol/L (ref 0–100)
pCO2 arterial: 38.3 mmHg (ref 32.0–48.0)
pCO2 arterial: 33.2 mmHg (ref 32.0–48.0)
pCO2 arterial: 36.6 mmHg (ref 32.0–48.0)
pCO2 arterial: 41.4 mmHg (ref 32.0–48.0)
pH, Arterial: 7.307 — ABNORMAL LOW (ref 7.350–7.450)
pH, Arterial: 7.376 (ref 7.350–7.450)
pH, Arterial: 7.417 (ref 7.350–7.450)
pH, Arterial: 7.44 (ref 7.350–7.450)
pO2, Arterial: 131 mmHg — ABNORMAL HIGH (ref 83.0–108.0)
pO2, Arterial: 123 mmHg — ABNORMAL HIGH (ref 83.0–108.0)
pO2, Arterial: 99 mmHg (ref 83.0–108.0)
pO2, Arterial: 165 mmHg — ABNORMAL HIGH (ref 83.0–108.0)

## 2016-09-16 LAB — POCT I-STAT, CHEM 8
BUN: 13 mg/dL (ref 6–20)
BUN: 20 mg/dL (ref 6–20)
Calcium, Ion: 1.1 mmol/L — ABNORMAL LOW (ref 1.15–1.40)
Calcium, Ion: 1.14 mmol/L — ABNORMAL LOW (ref 1.15–1.40)
Chloride: 102 mmol/L (ref 101–111)
Chloride: 104 mmol/L (ref 101–111)
Creatinine, Ser: 0.9 mg/dL (ref 0.61–1.24)
Creatinine, Ser: 1.1 mg/dL (ref 0.61–1.24)
Glucose, Bld: 100 mg/dL — ABNORMAL HIGH (ref 65–99)
Glucose, Bld: 214 mg/dL — ABNORMAL HIGH (ref 65–99)
HCT: 19 % — ABNORMAL LOW (ref 39.0–52.0)
HCT: 20 % — ABNORMAL LOW (ref 39.0–52.0)
Hemoglobin: 6.5 g/dL — CL (ref 13.0–17.0)
Hemoglobin: 6.8 g/dL — CL (ref 13.0–17.0)
Potassium: 3.5 mmol/L (ref 3.5–5.1)
Potassium: 4.1 mmol/L (ref 3.5–5.1)
Sodium: 139 mmol/L (ref 135–145)
Sodium: 140 mmol/L (ref 135–145)
TCO2: 21 mmol/L (ref 0–100)
TCO2: 26 mmol/L (ref 0–100)

## 2016-09-16 LAB — ECHOCARDIOGRAM COMPLETE
AO mean calculated velocity dopler: 124 cm/s
AV Area VTI index: 1.94 cm2/m2
AV Area VTI: 4.15 cm2
AV Area mean vel: 3.88 cm2
AV Mean grad: 7 mmHg
AV Peak grad: 16 mmHg
AV VEL mean LVOT/AV: 0.79
AV area mean vel ind: 1.76 cm2/m2
AV peak Index: 1.88
AV pk vel: 199 cm/s
AV vel: 4.29
Ao pk vel: 0.84 m/s
E decel time: 140 msec
E/e' ratio: 15.45
FS: 30 % (ref 28–44)
IVS/LV PW RATIO, ED: 1.22
LA ID, A-P, ES: 31 mm
LA diam end sys: 31 mm
LA diam index: 1.4 cm/m2
LV E/e' medial: 15.45
LV E/e'average: 15.45
LV PW d: 9 mm — AB (ref 0.6–1.1)
LV e' LATERAL: 5.63 cm/s
LVOT MV VTI INDEX: 1.52 cm2/m2
LVOT MV VTI: 3.35
LVOT SV: 89 mL
LVOT VTI: 18.1 cm
LVOT area: 4.91 cm2
LVOT diameter: 25 mm
LVOT peak VTI: 0.87 cm
LVOT peak grad rest: 11 mmHg
LVOT peak vel: 168 cm/s
Lateral S' vel: 10.4 cm/s
MV Annulus VTI: 26.5 cm
MV Dec: 140
MV M vel: 67.7
MV Peak grad: 3 mmHg
MV pk A vel: 94.6 m/s
MV pk E vel: 87 m/s
Mean grad: 2 mmHg
Reg peak vel: 244 cm/s
TAPSE: 13.7 mm
TDI e' lateral: 5.63
TDI e' medial: 7.26
TR max vel: 244 cm/s
VTI: 20.7 cm
Valve area index: 1.94
Valve area: 4.29 cm2
Weight: 3435.65 oz

## 2016-09-16 LAB — BPAM PLATELET PHERESIS
Blood Product Expiration Date: 201808221237
Blood Product Expiration Date: 201808232359
Blood Product Expiration Date: 201808242359
ISSUE DATE / TIME: 201808211239
ISSUE DATE / TIME: 201808211815
ISSUE DATE / TIME: 201808212031
Unit Type and Rh: 7300
Unit Type and Rh: 6200
Unit Type and Rh: 7300

## 2016-09-16 LAB — BPAM FFP
BLOOD PRODUCT EXPIRATION DATE: 201808262359
Blood Product Expiration Date: 201808262359
Blood Product Expiration Date: 201808262359
Blood Product Expiration Date: 201808262359
ISSUE DATE / TIME: 201808211318
ISSUE DATE / TIME: 201808211318
ISSUE DATE / TIME: 201808211703
ISSUE DATE / TIME: 201808211839
Unit Type and Rh: 8400
Unit Type and Rh: 8400
Unit Type and Rh: 8400
Unit Type and Rh: 8400

## 2016-09-16 LAB — PREPARE PLATELET PHERESIS
Unit division: 0
Unit division: 0
Unit division: 0

## 2016-09-16 LAB — POCT I-STAT 4, (NA,K, GLUC, HGB,HCT)
Glucose, Bld: 122 mg/dL — ABNORMAL HIGH (ref 65–99)
HCT: 24 % — ABNORMAL LOW (ref 39.0–52.0)
Hemoglobin: 8.2 g/dL — ABNORMAL LOW (ref 13.0–17.0)
Potassium: 3.9 mmol/L (ref 3.5–5.1)
Sodium: 142 mmol/L (ref 135–145)

## 2016-09-16 LAB — BPAM CRYOPRECIPITATE
Blood Product Expiration Date: 201808211852
Blood Product Expiration Date: 201808212315
Blood Product Expiration Date: 201808220227
ISSUE DATE / TIME: 201808211406
ISSUE DATE / TIME: 201808212031
ISSUE DATE / TIME: 201808212037
UNIT TYPE AND RH: 6200
Unit Type and Rh: 5100
Unit Type and Rh: 5100

## 2016-09-16 LAB — BLOOD GAS, ARTERIAL
Acid-base deficit: 2.9 mmol/L — ABNORMAL HIGH (ref 0.0–2.0)
Bicarbonate: 21.6 mmol/L (ref 20.0–28.0)
Drawn by: 51155
FIO2: 50
MECHVT: 550 mL
PEEP: 5 cmH2O
Patient temperature: 98.6
RATE: 12 resp/min
pCO2 arterial: 38.8 mmHg (ref 32.0–48.0)
pH, Arterial: 7.364 (ref 7.350–7.450)
pO2, Arterial: 164 mmHg — ABNORMAL HIGH (ref 83.0–108.0)

## 2016-09-16 LAB — PREPARE FRESH FROZEN PLASMA
UNIT DIVISION: 0
Unit division: 0
Unit division: 0
Unit division: 0

## 2016-09-16 LAB — MAGNESIUM
MAGNESIUM: 2 mg/dL (ref 1.7–2.4)
Magnesium: 2.2 mg/dL (ref 1.7–2.4)

## 2016-09-16 LAB — PREPARE RBC (CROSSMATCH)

## 2016-09-16 LAB — PROTIME-INR
INR: 1.33
Prothrombin Time: 16.6 seconds — ABNORMAL HIGH (ref 11.4–15.2)

## 2016-09-16 LAB — FIBRINOGEN: Fibrinogen: 307 mg/dL (ref 210–475)

## 2016-09-16 MED ORDER — POTASSIUM CHLORIDE 10 MEQ/50ML IV SOLN
INTRAVENOUS | Status: AC
  Filled 2016-09-16: qty 150

## 2016-09-16 MED ORDER — SODIUM BICARBONATE 8.4 % IV SOLN
100.0000 meq | Freq: Once | INTRAVENOUS | Status: AC
Start: 2016-09-16 — End: 2016-09-16
  Administered 2016-09-16: 100 meq via INTRAVENOUS

## 2016-09-16 MED ORDER — MILRINONE LACTATE IN DEXTROSE 20-5 MG/100ML-% IV SOLN
0.2500 ug/kg/min | INTRAVENOUS | Status: DC
  Administered 2016-09-16: 0.25 ug/kg/min via INTRAVENOUS
  Filled 2016-09-16: qty 100

## 2016-09-16 MED ORDER — POTASSIUM CHLORIDE 10 MEQ/50ML IV SOLN
10.0000 meq | INTRAVENOUS | Status: AC
  Administered 2016-09-16 (×3): 10 meq via INTRAVENOUS

## 2016-09-16 MED ORDER — POTASSIUM CHLORIDE 10 MEQ/50ML IV SOLN
10.0000 meq | INTRAVENOUS | Status: AC
  Administered 2016-09-16 (×3): 10 meq via INTRAVENOUS
  Filled 2016-09-16: qty 50

## 2016-09-16 MED ORDER — SODIUM BICARBONATE 8.4 % IV SOLN
50.0000 meq | Freq: Once | INTRAVENOUS | Status: AC
  Administered 2016-09-16: 50 meq via INTRAVENOUS

## 2016-09-16 MED ORDER — DEXTROSE 5 % IV SOLN
0.0000 ug/min | INTRAVENOUS | Status: DC
  Administered 2016-09-16: 24 ug/min via INTRAVENOUS
  Filled 2016-09-16 (×3): qty 16

## 2016-09-16 MED ORDER — SODIUM CHLORIDE 0.9 % IV SOLN
Freq: Once | INTRAVENOUS | Status: AC
  Administered 2016-09-16: 22:00:00 via INTRAVENOUS

## 2016-09-16 MED ORDER — CALCIUM CHLORIDE 10 % IV SOLN
300.0000 mg | Freq: Once | INTRAVENOUS | Status: AC
  Administered 2016-09-16: 300 mg via INTRAVENOUS

## 2016-09-16 MED ORDER — CALCIUM CHLORIDE 10 % IV SOLN
1.0000 g | Freq: Once | INTRAVENOUS | Status: AC
  Administered 2016-09-16: 0.5 g via INTRAVENOUS

## 2016-09-16 MED ORDER — CALCIUM CHLORIDE 10 % IV SOLN
INTRAVENOUS | Status: AC
  Filled 2016-09-16: qty 10

## 2016-09-16 MED ORDER — SODIUM BICARBONATE 8.4 % IV SOLN
INTRAVENOUS | Status: AC
  Filled 2016-09-16: qty 50

## 2016-09-16 MED ORDER — DEXMEDETOMIDINE HCL IN NACL 400 MCG/100ML IV SOLN
0.0000 ug/kg/h | INTRAVENOUS | Status: DC
  Administered 2016-09-16: 0.7 ug/kg/h via INTRAVENOUS
  Administered 2016-09-16: 0.6 ug/kg/h via INTRAVENOUS
  Administered 2016-09-17 (×2): 0.7 ug/kg/h via INTRAVENOUS
  Filled 2016-09-16 (×4): qty 100

## 2016-09-16 MED ORDER — SODIUM CHLORIDE 0.9 % IV SOLN: Freq: Once | INTRAVENOUS | Status: AC

## 2016-09-16 MED ORDER — ALBUMIN HUMAN 25 % IV SOLN
12.5000 g | Freq: Once | INTRAVENOUS | Status: AC
Start: 2016-09-16 — End: 2016-09-16

## 2016-09-16 MED FILL — Sodium Bicarbonate IV Soln 8.4%: INTRAVENOUS | Qty: 50 | Status: AC

## 2016-09-16 MED FILL — Sodium Chloride IV Soln 0.9%: INTRAVENOUS | Qty: 3000 | Status: AC

## 2016-09-16 MED FILL — Electrolyte-R (PH 7.4) Solution: INTRAVENOUS | Qty: 5000 | Status: AC

## 2016-09-16 MED FILL — Mannitol IV Soln 20%: INTRAVENOUS | Qty: 500 | Status: AC

## 2016-09-16 MED FILL — Lidocaine HCl IV Inj 20 MG/ML: INTRAVENOUS | Qty: 5 | Status: AC

## 2016-09-16 NOTE — Care Management Note (Signed)
Case Management Note Marvetta Gibbons RN, BSN Unit 4E-Case Manager-- [place] coverage [phone]  Patient Details  Name: Lonnie Hansen MRN: [id number] Date of Birth: 1951-01-14  Subjective/Objective:  Pt admitted s/p AORTIC VALVE REPLACEMENT (AVR)  ASCENDING AORTIC ROOT REPLACEMENT -- 0n 09/15/16                   Action/Plan: PTA pt lived at home with wife- CM to follow for d/c needs  Expected Discharge Date:                  Expected Discharge Plan:  Home/Self Care  In-House Referral:     Discharge planning Services  CM Consult  Post Acute Care Choice:    Choice offered to:     DME Arranged:    DME Agency:     HH Arranged:    HH Agency:     Status of Service:  In process, will continue to follow  If discussed at Long Length of Stay Meetings, dates discussed:    Discharge Disposition:   Additional Comments:  Dawayne Patricia, RN 09/16/2016, 12:22 PM

## 2016-09-16 NOTE — Progress Notes (Signed)
  Echocardiogram 2D Echocardiogram has been performed.  Johny Chess 09/16/2016, 11:42 AM

## 2016-09-16 NOTE — Progress Notes (Signed)
Initial Nutrition Assessment  DOCUMENTATION CODES:   Obesity unspecified  INTERVENTION:  - If BP and MAP become appropriate for ability to start TF, recommend Vital High Protein @ 30 mL/hr with 60 mL Prostat TID. This regimen will provide 1320 kcal (102% maximum estimated kcal need), 153 grams of protein (105% maximum estimated protein need), and 602 mL free water.   NUTRITION DIAGNOSIS:   Inadequate oral intake related to inability to eat as evidenced by NPO status.  GOAL:   Provide needs based on ASPEN/SCCM guidelines  MONITOR:   Vent status, Weight trends, Labs, Skin  REASON FOR ASSESSMENT:   Ventilator  ASSESSMENT:   66 year-old male who was originally was having hip pain making it difficult for him to be active. He was seeing Dr. Ninfa Linden for consideration of right hip replacement. In preparation for this a chest x-ray was performed leading to a CT scan of the chest. He has no previous history of myocardial infarction. He has increasing SOB with activity. On 8/21 pt underwent aortic valve replacement, ascending aortic root replacement, and TEE.   Pt seen for new vent. Weight +10 lbs/4.8 kg from 09/11/16-09/16/16; weight from 8/17 (92.6 kg) used to estimate kcal need; with this weight BMI of 30.13, obese. Family member at bedside who reports that prior to admission and surgery pt's appetite was at baseline. He had begun to make some diet changes (such as healthier choices) but otherwise no changes noted.   Physical assessment shows no muscle wasting, no fat wasting, mild edema throughout all extremities. Weight increase discussed above.   Patient is currently intubated on ventilator support with OGT in place.  MV: 13.6 L/min Temp (24hrs), Avg:97.8 F (36.6 C), Min:95.7 F (35.4 C), Max:99.7 F (37.6 C) Propofol: none BP: 62/44 and MAP: 52  Medications reviewed; 12.5 mg albumin x1 dose today, 10 mg rectal Dulcolax/day, 1 g IV CaCl x1 dose yesterday and x1 dose today, 200 mg  Colace per OGT/day, 20 mg IV Pepcid BID, 4 g IV Mg sulfate x1 run yesterday, 40 mg Protonix/day, 10 mEq IV KCl x6 runs yesterday and x3 runs today, 100 mEq IV sodium bicarb x1 dose today, 50 mEq IV sodium bicarb x1 dose today. Labs reviewed; CBGs: 142-206 mg/dL since midnight, K: 3.2 mmol/L, Ca: 7.1 mg/dL, Cl: 113 mmol/L.  Drips: Precedex @ 0.6 mcg/kg/hr, Dopamine @ 4 mcg/kg/min, Levo @ 35 mcg/min, Epi @ 8 mcg/min, insulin @ 18.6 units/hr.     Diet Order:   NPO  Skin:  Wound (see comment) (Chest incision 8/21)  Last BM:  8/20  Height:   Ht Readings from Last 1 Encounters:  09/11/16 5\' 9"  (1.753 m)    Weight:   Wt Readings from Last 1 Encounters:  09/16/16 214 lb 11.7 oz (97.4 kg)    Ideal Body Weight:  72.73 kg  BMI:  Body mass index is 31.71 kg/m.  Estimated Nutritional Needs:   Kcal:  7616-0737 (11-14 kcal/kg)  Protein:  145 grams (2 grams/kg IBW)  Fluid:  >/= 1.8 L/day  EDUCATION NEEDS:   No education needs identified at this time    Jarome Matin, MS, RD, LDN, CNSC Inpatient Clinical Dietitian Pager # [phone] After hours/weekend pager # [phone]

## 2016-09-16 NOTE — Progress Notes (Signed)
1 Day Post-Op Procedure(s) (LRB): AORTIC VALVE REPLACEMENT (AVR) (N/A) ASCENDING AORTIC ROOT REPLACEMENT (N/A) TRANSESOPHAGEAL ECHOCARDIOGRAM (TEE) (N/A) Subjective: Patient with coagulation  disorder and bleeding after aVR-replacement of ascending aorta Chest tube output significantly decreased after blood products including platelets, cryoprecipitate. Patient has received his third unit of blood this shift for latest hemoglobin of 7.5. Cardiac index 1.7 with increased now medium dose norepinephrine and low-dose epinephrine Filling pressures are normal. Urine output 60 cc/h ABG check shows adequate oxygenation but HCO3 19 with base deficit -7 treated with bicarbonate. Chest x-ray shows mild increase in mediastinal hematoma, no hemothorax We'll continue with checking acid-base and hemoglobin levels. Objective: Vital signs in last 24 hours: Temp:  [95.7 F (35.4 C)-99.7 F (37.6 C)] 98.8 F (37.1 C) (08/22 0245) Pulse Rate:  [58-99] 87 (08/22 0245) Cardiac Rhythm: Atrial paced (08/22 0200) Resp:  [12-25] 16 (08/22 0245) BP: (90-133)/(62-78) 104/70 (08/22 0200) SpO2:  [95 %-100 %] 100 % (08/22 0245) Arterial Line BP: (72-127)/(46-74) 116/61 (08/22 0245) FiO2 (%):  [50 %-60 %] 50 % (08/22 0200)  Hemodynamic parameters for last 24 hours: PAP: (22-32)/(13-21) 29/16 CVP:  [15 mmHg] 15 mmHg CO:  [3.2 L/min-4.7 L/min] 3.7 L/min CI:  [1.5 L/min/m2-2.3 L/min/m2] 1.8 L/min/m2  Intake/Output from previous day: 08/21 0701 - 08/22 0700 In: 9642.5 [I.V.:5274.5; ZOXWR:6045; NG/GT:30; IV Piggyback:1450] Out: 4098 [Urine:4970; Chest Tube:1160] Intake/Output this shift: Total I/O In: 3460.8 [I.V.:1530.8; Blood:1180; IV Piggyback:750] Out: 825 [Urine:495; Chest Tube:330]  Sedated on ventilator Lungs clear Extremities warm  Lab Results:  Recent Labs  09/15/16 1934  09/16/16 0045 09/16/16 0050 09/16/16 0114  WBC 14.3*  --  7.4  --   --   HGB 8.6*  < > 7.7* 6.5*  --   HCT 24.8*  < >  22.0* 19.0*  --   PLT 110*  --  125*  --  135*  < > = values in this interval not displayed. BMET:  Recent Labs  09/15/16 1950 09/16/16 0050  NA 141 140  K 3.6 3.5  CL 102 104  GLUCOSE 220* 214*  BUN 12 13  CREATININE 0.70 0.90    PT/INR:  Recent Labs  09/16/16 0114  LABPROT 16.8*  INR 1.35   ABG    Component Value Date/Time   PHART 7.307 (L) 09/16/2016 0150   HCO3 19.0 (L) 09/16/2016 0150   TCO2 20 09/16/2016 0150   ACIDBASEDEF 7.0 (H) 09/16/2016 0150   O2SAT 52.6 09/16/2016 0200   CBG (last 3)   Recent Labs  09/16/16 0004 09/16/16 0049 09/16/16 0210  GLUCAP 228* 209* 206*    Assessment/Plan: S/P Procedure(s) (LRB): AORTIC VALVE REPLACEMENT (AVR) (N/A) ASCENDING AORTIC ROOT REPLACEMENT (N/A) TRANSESOPHAGEAL ECHOCARDIOGRAM (TEE) (N/A) Check CBC every 4 hours Repeat chest x-ray 0600  LOS: 1 day    Lonnie Hansen 09/16/2016

## 2016-09-16 NOTE — Significant Event (Addendum)
Patient's SBP suddenly dropped to the 30-50s via A-Line at 0901, cuff pressures were in the 60s . Patient was awake during this time and vagals easily. Only other change in the last hour is that milrinone was added about 30 minutes ago.   Milrione drip stopped. Pressors increased. Dr. Servando Snare is called in the OR. Dr. Prescott Gum was notified as he was in the unit. New orders received; labs drawn. Hemodynamics stabilize after changes were made.     Lonnie Hansen

## 2016-09-16 NOTE — Progress Notes (Signed)
TCTS BRIEF SICU PROGRESS NOTE  1 Day Post-Op  S/P Procedure(s) (LRB): AORTIC VALVE REPLACEMENT (AVR) (N/A) ASCENDING AORTIC ROOT REPLACEMENT (N/A) TRANSESOPHAGEAL ECHOCARDIOGRAM (TEE) (N/A)   Intubated and sedated on vent BP reportedly somewhat labile earlier but improved last several hours NSR w/ stable hemodynamics and low PA pressures on dopamine and Epi drips O2 sats 100% on 40% FiO2 Chest tube output low UOP 30-50 mL/hr Hgb 7.3  Plan: Transfuse PRBCs and keep on vent until morning  Rexene Alberts, MD 09/16/2016 7:59 PM

## 2016-09-16 NOTE — Progress Notes (Signed)
Patient ID: Lonnie Hansen, male   DOB: 1950/12/03, 65 y.o.   MRN: [id number] [facility] DAILY ICU PROGRESS NOTE                   [address]            [place] [address]          [phone]   1 Day Post-Op Procedure(s) (LRB): AORTIC VALVE REPLACEMENT (AVR) (N/A) ASCENDING AORTIC ROOT REPLACEMENT (N/A) TRANSESOPHAGEAL ECHOCARDIOGRAM (TEE) (N/A)  Total Length of Stay:  LOS: 1 day   Subjective: remains intubated, moves all extremities to command still sedated , postop coagulapathy correcting chest tube drainage decreased   Objective: Vital signs in last 24 hours: Temp:  [95.7 F (35.4 C)-99.7 F (37.6 C)] 98.8 F (37.1 C) (08/22 0700) Pulse Rate:  [87-110] 101 (08/22 0700) Cardiac Rhythm: Normal sinus rhythm (08/22 0600) Resp:  [12-25] 20 (08/22 0700) BP: (90-115)/(62-79) 102/66 (08/22 0700) SpO2:  [95 %-100 %] 100 % (08/22 0700) Arterial Line BP: (72-127)/(46-74) 88/58 (08/22 0700) FiO2 (%):  [50 %-60 %] 50 % (08/22 0700)  There were no vitals filed for this visit.  Weight change:    Hemodynamic parameters for last 24 hours: PAP: (20-32)/(13-21) 23/13 CVP:  [12 mmHg-15 mmHg] 12 mmHg CO:  [3.2 L/min-4.7 L/min] 3.8 L/min CI:  [1.5 L/min/m2-2.3 L/min/m2] 1.8 L/min/m2  Intake/Output from previous day: 08/21 0701 - 08/22 0700 In: 10804.4 [I.V.:6051.4; Blood:3173; NG/GT:30; IV Piggyback:1550] Out: 6410 [Urine:5120; Emesis/NG output:100; Chest Tube:1190]  Intake/Output this shift: No intake/output data recorded.  Current Meds: Scheduled Meds: . acetaminophen  1,000 mg Oral Q6H   Or  . acetaminophen (TYLENOL) oral liquid 160 mg/5 mL  1,000 mg Per Tube Q6H  . aspirin EC  325 mg Oral Daily   Or  . aspirin  324 mg Per Tube Daily  . bisacodyl  10 mg Oral Daily   Or  . bisacodyl  10 mg Rectal Daily  . chlorhexidine  15 mL Mouth/Throat NOW  . chlorhexidine gluconate (MEDLINE KIT)  15 mL Mouth Rinse BID  . docusate sodium  200 mg Oral Daily  . insulin  regular  0-10 Units Intravenous TID WC  . latanoprost  1 drop Both Eyes QHS  . mouth rinse  15 mL Mouth Rinse 10 times per day  . metoprolol tartrate  12.5 mg Oral BID   Or  . metoprolol tartrate  12.5 mg Per Tube BID  . [START ON 09/17/2016] pantoprazole  40 mg Oral Daily  . sodium chloride flush  3 mL Intravenous Q12H   Continuous Infusions: . sodium chloride 20 mL/hr at 09/15/16 1900  . sodium chloride    . sodium chloride 20 mL/hr at 09/15/16 1900  . sodium chloride    . sodium chloride    . albumin human    . cefUROXime (ZINACEF)  IV Stopped (09/15/16 2002)  . dexmedetomidine (PRECEDEX) IV infusion 0.7 mcg/kg/hr (09/16/16 0700)  . DOPamine 4 mcg/kg/min (09/16/16 0700)  . epinephrine 4 mcg/min (09/16/16 0700)  . famotidine (PEPCID) IV Stopped (09/15/16 1632)  . insulin (NOVOLIN-R) infusion 20.5 Units/hr (09/16/16 7078)  . lactated ringers    . lactated ringers 20 mL/hr at 09/15/16 1900  . lactated ringers    . nitroGLYCERIN Stopped (09/15/16 1545)  . norepinephrine (LEVOPHED) Adult infusion    . phenylephrine (NEO-SYNEPHRINE) Adult infusion Stopped (09/15/16 2100)  . potassium chloride 10 mEq (09/16/16 0702)   PRN Meds:.sodium chloride, albumin human, lactated ringers,  metoprolol tartrate, midazolam, morphine injection, ondansetron (ZOFRAN) IV, oxyCODONE, sodium chloride flush, traMADol  General appearance: alert and cooperative Neurologic: intact Heart: regular rate and rhythm, S1, S2 normal, no murmur, click, rub or gallop Lungs: diminished breath sounds bibasilar Abdomen: soft, non-tender; bowel sounds normal; no masses,  no organomegaly Extremities: extremities normal, atraumatic, no cyanosis or edema and Homans sign is negative, no sign of DVT Wound: sternum stable   Lab Results: CBC: Recent Labs  09/16/16 0045 09/16/16 0050 09/16/16 0114 09/16/16 0409  WBC 7.4  --   --  8.7  HGB 7.7* 6.5*  --  8.8*  HCT 22.0* 19.0*  --  26.0*  PLT 125*  --  135* 124*    BMET:  Recent Labs  09/16/16 0050 09/16/16 0409  NA 140 141  K 3.5 3.2*  CL 104 113*  CO2  --  21*  GLUCOSE 214* 166*  BUN 13 13  CREATININE 0.90 1.04  CALCIUM  --  7.1*    CMET: Lab Results  Component Value Date   WBC 8.7 09/16/2016   HGB 8.8 (L) 09/16/2016   HCT 26.0 (L) 09/16/2016   PLT 124 (L) 09/16/2016   GLUCOSE 166 (H) 09/16/2016   CHOL  04/28/2010    187        ATP III CLASSIFICATION:  <200     mg/dL   Desirable  200-239  mg/dL   Borderline High  >=240    mg/dL   High          TRIG 69 04/28/2010   HDL 54 04/28/2010   LDLCALC (H) 04/28/2010    119        Total Cholesterol/HDL:CHD Risk Coronary Heart Disease Risk Table                     Men   Women  1/2 Average Risk   3.4   3.3  Average Risk       5.0   4.4  2 X Average Risk   9.6   7.1  3 X Average Risk  23.4   11.0        Use the calculated Patient Ratio above and the CHD Risk Table to determine the patient's CHD Risk.        ATP III CLASSIFICATION (LDL):  <100     mg/dL   Optimal  100-129  mg/dL   Near or Above                    Optimal  130-159  mg/dL   Borderline  160-189  mg/dL   High  >190     mg/dL   Very High   ALT 17 09/11/2016   AST 31 09/11/2016   NA 141 09/16/2016   K 3.2 (L) 09/16/2016   CL 113 (H) 09/16/2016   CREATININE 1.04 09/16/2016   BUN 13 09/16/2016   CO2 21 (L) 09/16/2016   TSH 1.611 04/28/2010   INR 1.35 09/16/2016   HGBA1C 5.3 09/11/2016      PT/INR:  Recent Labs  09/16/16 0114  LABPROT 16.8*  INR 1.35   Radiology: Dg Chest Port 1 View  Result Date: 09/16/2016 CLINICAL DATA:  Aortic valve replacement . EXAM: PORTABLE CHEST 1 VIEW COMPARISON:  09/16/2016.  09/15/2016.  CT 03/26/2016 FINDINGS: Endotracheal tube, NG tube, mediastinal drainage catheter, Swan-Ganz catheter in stable position. Prior aortic valve replacement. Stable cardiomegaly. Stable fullness of the mediastinum consistent with stable aneurysmal dilatation of  the thoracic aorta again noted.  Low lung volumes with progressive left base atelectasis. Small bilateral pleural effusions. No pneumothorax. IMPRESSION: 1.  Lines and tubes in stable position.  No pneumothorax. 2. Prior aortic valve replacement. Stable cardiomegaly. 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 3. Low lung volumes with progressive left base atelectasis . Small bilateral pleural effusions. Electronically Signed   By: Marcello Moores  Register   On: 09/16/2016 07:23   Dg Chest Port 1 View  Result Date: 09/16/2016 CLINICAL DATA:  Aortic valve replacement EXAM: PORTABLE CHEST 1 VIEW COMPARISON:  Chest radiograph 09/15/2016 FINDINGS: Endotracheal tube tip is in appropriate position at the level of the clavicular heads. Orogastric tube courses beyond the field of view. The course of the pulmonary arterial catheter is incompletely visualized but the tip overlies the main pulmonary artery. Widened appearance of the upper mediastinum is unchanged. Bibasilar atelectasis without focal consolidation. IMPRESSION: 1. Unchanged support apparatus. 2. Persistent upper mediastinal widening and bibasilar atelectasis. Electronically Signed   By: Ulyses Jarred M.D.   On: 09/16/2016 01:21   Dg Chest Port 1 View  Result Date: 09/15/2016 CLINICAL DATA:  Hypoxia.  Status post aortic valve replacement EXAM: PORTABLE CHEST 1 VIEW COMPARISON:  September 11, 2016 FINDINGS: Endotracheal tube tip is 2.2 cm above the carina. Swan-Ganz catheter tip is in the main pulmonary outflow tract. Nasogastric tube tip and side port are in the stomach. There is a left chest tube and mediastinal drain. No pneumothorax. There are areas of subsegmental atelectasis at several sites in the right lung. There is mild left base atelectasis as well. No airspace consolidation. Heart is mildly enlarged with pulmonary vascularity within normal limits. Aorta is tortuous but stable. There is aortic atherosclerosis. No  evident adenopathy. No pneumothorax. No bone lesions. Patient is status post aortic valve replacement. IMPRESSION: Tube and catheter positions as described without pneumothorax. Areas of atelectatic change without airspace consolidation evident. Status post aortic valve replacement. There is thoracic aortic prominence and tortuosity with aortic atherosclerosis, stable. Aortic Atherosclerosis (ICD10-I70.0). Electronically Signed   By: Lowella Grip III M.D.   On: 09/15/2016 16:26     Assessment/Plan: S/P Procedure(s) (LRB): AORTIC VALVE REPLACEMENT (AVR) (N/A) ASCENDING AORTIC ROOT REPLACEMENT (N/A) TRANSESOPHAGEAL ECHOCARDIOGRAM (TEE) (N/A) Expected Acute  Blood - loss Anemia Postop coagulapathy, requred ffp and plts and prbc to correct  ci 1.7-15   slowy wean vent as tolerate no ready for extubation yet  Start low dose milrinone to help ci cvp low  Leave chest tube in place  uop ok    Grace Isaac 09/16/2016 7:56 AM

## 2016-09-16 NOTE — Progress Notes (Signed)
Dr. Prescott Gum made aware of soft BP, low cardiac output, chest tube output, lab values, and overall patient status. Orders received for 1/2 amp calcium chloride push, stat portable chest xray, 1 unit of PRBC, increase Epi to 44mcg and Dopamine to 4 mcg. Will carry out these orders and continue to closely monitor. Richarda Blade RN

## 2016-09-17 ENCOUNTER — Inpatient Hospital Stay (HOSPITAL_COMMUNITY): Payer: Medicare Other

## 2016-09-17 LAB — COMPREHENSIVE METABOLIC PANEL
ALT: 13 U/L — ABNORMAL LOW (ref 17–63)
AST: 47 U/L — ABNORMAL HIGH (ref 15–41)
Albumin: 2.9 g/dL — ABNORMAL LOW (ref 3.5–5.0)
Alkaline Phosphatase: 28 U/L — ABNORMAL LOW (ref 38–126)
Anion gap: 6 (ref 5–15)
BUN: 21 mg/dL — ABNORMAL HIGH (ref 6–20)
CO2: 24 mmol/L (ref 22–32)
Calcium: 7.9 mg/dL — ABNORMAL LOW (ref 8.9–10.3)
Chloride: 107 mmol/L (ref 101–111)
Creatinine, Ser: 1.14 mg/dL (ref 0.61–1.24)
GFR calc Af Amer: >60 mL/min (ref >60)
GFR calc non Af Amer: >60 mL/min (ref >60)
Glucose, Bld: 105 mg/dL — ABNORMAL HIGH (ref 65–99)
Potassium: 4.5 mmol/L (ref 3.5–5.1)
Sodium: 137 mmol/L (ref 135–145)
Total Bilirubin: 1.2 mg/dL (ref 0.3–1.2)
Total Protein: 5 g/dL — ABNORMAL LOW (ref 6.5–8.1)

## 2016-09-17 LAB — TYPE AND SCREEN
ABO/RH(D): AB POS
Antibody Screen: NEGATIVE
Unit division: 0
Unit division: 0
Unit division: 0
Unit division: 0
Unit division: 0
Unit division: 0

## 2016-09-17 LAB — PROTIME-INR
INR: 1.11
Prothrombin Time: 14.4 seconds (ref 11.4–15.2)

## 2016-09-17 LAB — POCT I-STAT 3, ART BLOOD GAS (G3+)
Acid-Base Excess: 3 mmol/L — ABNORMAL HIGH (ref 0.0–2.0)
Acid-Base Excess: 3 mmol/L — ABNORMAL HIGH (ref 0.0–2.0)
Bicarbonate: 25.7 mmol/L (ref 20.0–28.0)
Bicarbonate: 26.6 mmol/L (ref 20.0–28.0)
O2 Saturation: 94 %
O2 Saturation: 93 %
Patient temperature: 100
Patient temperature: 37.4
TCO2: 27 mmol/L (ref 0–100)
TCO2: 28 mmol/L (ref 0–100)
pCO2 arterial: 33.5 mmHg (ref 32.0–48.0)
pCO2 arterial: 35.6 mmHg (ref 32.0–48.0)
pH, Arterial: 7.496 — ABNORMAL HIGH (ref 7.350–7.450)
pH, Arterial: 7.483 — ABNORMAL HIGH (ref 7.350–7.450)
pO2, Arterial: 67 mmHg — ABNORMAL LOW (ref 83.0–108.0)
pO2, Arterial: 63 mmHg — ABNORMAL LOW (ref 83.0–108.0)

## 2016-09-17 LAB — BPAM RBC
Blood Product Expiration Date: 201809142359
Blood Product Expiration Date: 201809172359
Blood Product Expiration Date: 201809172359
Blood Product Expiration Date: 201809202359
Blood Product Expiration Date: 201809202359
Blood Product Expiration Date: 201809202359
ISSUE DATE / TIME: 201808210945
ISSUE DATE / TIME: 201808210945
ISSUE DATE / TIME: 201808212031
ISSUE DATE / TIME: 201808220115
ISSUE DATE / TIME: 201808222114
ISSUE DATE / TIME: 201808222349
Unit Type and Rh: 6200
Unit Type and Rh: 6200
Unit Type and Rh: 6200
Unit Type and Rh: 8400
Unit Type and Rh: 8400
Unit Type and Rh: 8400

## 2016-09-17 LAB — GLUCOSE, CAPILLARY
Glucose-Capillary: 106 mg/dL — ABNORMAL HIGH (ref 65–99)
Glucose-Capillary: 114 mg/dL — ABNORMAL HIGH (ref 65–99)
Glucose-Capillary: 98 mg/dL (ref 65–99)
Glucose-Capillary: 101 mg/dL — ABNORMAL HIGH (ref 65–99)
Glucose-Capillary: 119 mg/dL — ABNORMAL HIGH (ref 65–99)
Glucose-Capillary: 102 mg/dL — ABNORMAL HIGH (ref 65–99)
Glucose-Capillary: 111 mg/dL — ABNORMAL HIGH (ref 65–99)
Glucose-Capillary: 105 mg/dL — ABNORMAL HIGH (ref 65–99)
Glucose-Capillary: 106 mg/dL — ABNORMAL HIGH (ref 65–99)
Glucose-Capillary: 111 mg/dL — ABNORMAL HIGH (ref 65–99)
Glucose-Capillary: 141 mg/dL — ABNORMAL HIGH (ref 65–99)
Glucose-Capillary: 91 mg/dL (ref 65–99)
Glucose-Capillary: 94 mg/dL (ref 65–99)
Glucose-Capillary: 106 mg/dL — ABNORMAL HIGH (ref 65–99)

## 2016-09-17 LAB — CBC
HCT: 27.1 % — ABNORMAL LOW (ref 39.0–52.0)
HCT: 25.5 % — ABNORMAL LOW (ref 39.0–52.0)
Hemoglobin: 9.3 g/dL — ABNORMAL LOW (ref 13.0–17.0)
Hemoglobin: 8.8 g/dL — ABNORMAL LOW (ref 13.0–17.0)
MCH: 30 pg (ref 26.0–34.0)
MCH: 30.1 pg (ref 26.0–34.0)
MCHC: 34.3 g/dL (ref 30.0–36.0)
MCHC: 34.5 g/dL (ref 30.0–36.0)
MCV: 87.4 fL (ref 78.0–100.0)
MCV: 87.3 fL (ref 78.0–100.0)
Platelets: 62 10*3/uL — ABNORMAL LOW (ref 150–400)
Platelets: 51 10*3/uL — ABNORMAL LOW (ref 150–400)
RBC: 3.1 MIL/uL — ABNORMAL LOW (ref 4.22–5.81)
RBC: 2.92 MIL/uL — ABNORMAL LOW (ref 4.22–5.81)
RDW: 14.4 % (ref 11.5–15.5)
RDW: 14.9 % (ref 11.5–15.5)
WBC: 11 10*3/uL — ABNORMAL HIGH (ref 4.0–10.5)
WBC: 11.2 10*3/uL — ABNORMAL HIGH (ref 4.0–10.5)

## 2016-09-17 LAB — BASIC METABOLIC PANEL
Anion gap: 7 (ref 5–15)
BUN: 19 mg/dL (ref 6–20)
CO2: 25 mmol/L (ref 22–32)
Calcium: 7.8 mg/dL — ABNORMAL LOW (ref 8.9–10.3)
Chloride: 106 mmol/L (ref 101–111)
Creatinine, Ser: 0.91 mg/dL (ref 0.61–1.24)
GFR calc Af Amer: >60 mL/min (ref >60)
GFR calc non Af Amer: >60 mL/min (ref >60)
Glucose, Bld: 109 mg/dL — ABNORMAL HIGH (ref 65–99)
Potassium: 4 mmol/L (ref 3.5–5.1)
Sodium: 138 mmol/L (ref 135–145)

## 2016-09-17 LAB — COOXEMETRY PANEL
Carboxyhemoglobin: 1 % (ref 0.5–1.5)
Carboxyhemoglobin: 1 % (ref 0.5–1.5)
Methemoglobin: 1.1 % (ref 0.0–1.5)
Methemoglobin: 1.3 % (ref 0.0–1.5)
O2 Saturation: 51 %
O2 Saturation: 55.5 %
Total hemoglobin: 9.5 g/dL — ABNORMAL LOW (ref 12.0–16.0)
Total hemoglobin: 9.1 g/dL — ABNORMAL LOW (ref 12.0–16.0)

## 2016-09-17 LAB — TROPONIN I
Troponin I: 4.01 ng/mL (ref <0.03)
Troponin I: 9.82 ng/mL (ref <0.03)

## 2016-09-17 MED ORDER — INSULIN DETEMIR 100 UNIT/ML ~~LOC~~ SOLN
15.0000 [IU] | Freq: Every day | SUBCUTANEOUS | Status: DC
  Administered 2016-09-17 – 2016-09-19 (×3): 15 [IU] via SUBCUTANEOUS
  Filled 2016-09-17 (×4): qty 0.15

## 2016-09-17 MED ORDER — FUROSEMIDE 10 MG/ML IJ SOLN
40.0000 mg | Freq: Once | INTRAMUSCULAR | Status: AC
  Administered 2016-09-17: 40 mg via INTRAVENOUS

## 2016-09-17 MED ORDER — FUROSEMIDE 10 MG/ML IJ SOLN
40.0000 mg | Freq: Once | INTRAMUSCULAR | Status: AC
  Administered 2016-09-17: 40 mg via INTRAVENOUS
  Filled 2016-09-17: qty 4

## 2016-09-17 MED ORDER — FUROSEMIDE 10 MG/ML IJ SOLN
INTRAMUSCULAR | Status: AC
  Administered 2016-09-17: 40 mg via INTRAVENOUS
  Filled 2016-09-17: qty 4

## 2016-09-17 MED ORDER — CHLORHEXIDINE GLUCONATE 0.12 % MT SOLN
OROMUCOSAL | Status: AC
  Filled 2016-09-17: qty 15

## 2016-09-17 MED FILL — Heparin Sodium (Porcine) Inj 1000 Unit/ML: INTRAMUSCULAR | Qty: 30 | Status: AC

## 2016-09-17 MED FILL — Potassium Chloride Inj 2 mEq/ML: INTRAVENOUS | Qty: 10 | Status: AC

## 2016-09-17 MED FILL — Magnesium Sulfate Inj 50%: INTRAMUSCULAR | Qty: 10 | Status: AC

## 2016-09-17 NOTE — Progress Notes (Signed)
CT Surgery  Extubated today Breathing comfortably Weaning drips BP stable

## 2016-09-17 NOTE — Progress Notes (Signed)
Pt extubated per MD order, positive cuff leak. Pt tol well, VC 400, NIF -20. (MD aware and gave order to extubate)No complications, no stridor or distress noted. Patient able to vocalize well. Will cont to monitor

## 2016-09-17 NOTE — Progress Notes (Signed)
Patient ID: Lonnie Hansen, male   DOB: 1950/07/13, 66 y.o.   MRN: [id number] [facility] DAILY ICU PROGRESS NOTE                   [address]            [place] [address]          [phone]   2 Days Post-Op Procedure(s) (LRB): AORTIC VALVE REPLACEMENT (AVR) (N/A) ASCENDING AORTIC ROOT REPLACEMENT (N/A) TRANSESOPHAGEAL ECHOCARDIOGRAM (TEE) (N/A)  Total Length of Stay:  LOS: 2 days   Subjective: Patient awake and alert follows commands moves all extremities well, very mild tremor right  hand   Objective: Vital signs in last 24 hours: Temp:  [98.4 F (36.9 C)-100.6 F (38.1 C)] 99.9 F (37.7 C) (08/23 0745) Pulse Rate:  [69-111] 75 (08/23 0745) Cardiac Rhythm: Normal sinus rhythm (08/23 0727) Resp:  [0-29] 17 (08/23 0745) BP: (62-149)/(44-90) 110/73 (08/23 0700) SpO2:  [92 %-100 %] 97 % (08/23 0745) Arterial Line BP: (36-181)/(25-92) 118/61 (08/23 0745) FiO2 (%):  [40 %] 40 % (08/23 0400) Weight:  [214 lb 11.7 oz (97.4 kg)] 214 lb 11.7 oz (97.4 kg) (08/22 0815)  Filed Weights   09/16/16 0815  Weight: 214 lb 11.7 oz (97.4 kg)    Weight change:    Hemodynamic parameters for last 24 hours: PAP: (11-36)/(5-24) 27/14 CVP:  [2 mmHg-57 mmHg] 11 mmHg CO:  [3.3 L/min-4.9 L/min] 4.1 L/min CI:  [1.6 L/min/m2-2.3 L/min/m2] 2 L/min/m2  Intake/Output from previous day: 08/22 0701 - 08/23 0700 In: 3608.6 [I.V.:2368.6; Blood:660; NG/GT:180; IV Piggyback:400] Out: 1280 [Urine:970; Emesis/NG output:250; Chest Tube:60]  Intake/Output this shift: Total I/O In: -  Out: 100 [Urine:100]  Current Meds: Scheduled Meds: . acetaminophen  1,000 mg Oral Q6H   Or  . acetaminophen (TYLENOL) oral liquid 160 mg/5 mL  1,000 mg Per Tube Q6H  . aspirin EC  325 mg Oral Daily   Or  . aspirin  324 mg Per Tube Daily  . bisacodyl  10 mg Oral Daily   Or  . bisacodyl  10 mg Rectal Daily  . chlorhexidine      . chlorhexidine gluconate (MEDLINE KIT)  15 mL Mouth Rinse BID  .  docusate sodium  200 mg Oral Daily  . insulin regular  0-10 Units Intravenous TID WC  . latanoprost  1 drop Both Eyes QHS  . mouth rinse  15 mL Mouth Rinse 10 times per day  . metoprolol tartrate  12.5 mg Oral BID   Or  . metoprolol tartrate  12.5 mg Per Tube BID  . pantoprazole  40 mg Oral Daily  . sodium chloride flush  3 mL Intravenous Q12H   Continuous Infusions: . sodium chloride 10 mL/hr at 09/17/16 0700  . sodium chloride 10 mL/hr at 09/16/16 2141  . cefUROXime (ZINACEF)  IV 1.5 g (09/17/16 0800)  . dexmedetomidine (PRECEDEX) IV infusion 0.7 mcg/kg/hr (09/17/16 0726)  . DOPamine 3 mcg/kg/min (09/17/16 0727)  . epinephrine 3 mcg/min (09/17/16 0700)  . insulin (NOVOLIN-R) infusion 2.2 Units/hr (09/17/16 0726)  . lactated ringers    . lactated ringers 10 mL/hr at 09/17/16 0700  . lactated ringers    . milrinone Stopped (09/16/16 0901)  . nitroGLYCERIN Stopped (09/15/16 1545)  . norepinephrine (LEVOPHED) Adult infusion 6 mcg/min (09/17/16 0700)  . phenylephrine (NEO-SYNEPHRINE) Adult infusion Stopped (09/15/16 2100)   PRN Meds:.sodium chloride, lactated ringers, metoprolol tartrate, midazolam, morphine injection, ondansetron (ZOFRAN) IV, oxyCODONE, sodium chloride  flush, traMADol  General appearance: alert, cooperative and on vent  Neurologic: intact Heart: regular rate and rhythm, S1, S2 normal, no murmur, click, rub or gallop Lungs: diminished breath sounds bibasilar Abdomen: soft, non-tender; bowel sounds normal; no masses,  no organomegaly Extremities: extremities normal, atraumatic, no cyanosis or edema and Homans sign is negative, no sign of DVT Wound: sternum stable   Lab Results: CBC: Recent Labs  09/16/16 1814 09/17/16 0251  WBC 11.4* 11.0*  HGB 7.3* 9.3*  HCT 20.8* 27.1*  PLT 81* 62*   BMET:  Recent Labs  09/16/16 1814 09/17/16 0251  NA 138 137  K 4.2 4.5  CL 106 107  CO2 24 24  GLUCOSE 87 105*  BUN 19 21*  CREATININE 1.24 1.14  CALCIUM 8.0*  7.9*    CMET: Lab Results  Component Value Date   WBC 11.0 (H) 09/17/2016   HGB 9.3 (L) 09/17/2016   HCT 27.1 (L) 09/17/2016   PLT 62 (L) 09/17/2016   GLUCOSE 105 (H) 09/17/2016   CHOL  04/28/2010    187        ATP III CLASSIFICATION:  <200     mg/dL   Desirable  200-239  mg/dL   Borderline High  >=240    mg/dL   High          TRIG 69 04/28/2010   HDL 54 04/28/2010   LDLCALC (H) 04/28/2010    119        Total Cholesterol/HDL:CHD Risk Coronary Heart Disease Risk Table                     Men   Women  1/2 Average Risk   3.4   3.3  Average Risk       5.0   4.4  2 X Average Risk   9.6   7.1  3 X Average Risk  23.4   11.0        Use the calculated Patient Ratio above and the CHD Risk Table to determine the patient's CHD Risk.        ATP III CLASSIFICATION (LDL):  <100     mg/dL   Optimal  100-129  mg/dL   Near or Above                    Optimal  130-159  mg/dL   Borderline  160-189  mg/dL   High  >190     mg/dL   Very High   ALT 13 (L) 09/17/2016   AST 47 (H) 09/17/2016   NA 137 09/17/2016   K 4.5 09/17/2016   CL 107 09/17/2016   CREATININE 1.14 09/17/2016   BUN 21 (H) 09/17/2016   CO2 24 09/17/2016   TSH 1.611 04/28/2010   INR 1.11 09/17/2016   HGBA1C 5.3 09/11/2016      PT/INR:  Recent Labs  09/17/16 0251  LABPROT 14.4  INR 1.11   Radiology: Dg Chest Port 1 View  Result Date: 09/17/2016 CLINICAL DATA:  Intubation. EXAM: PORTABLE CHEST 1 VIEW COMPARISON:  09/16/2016. FINDINGS: Endotracheal tube, NG tube, Swan-Ganz catheter, mediastinal drainage catheter stable position. 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 Prior cardiac valve replacement. Cardiomegaly. Diffuse bilateral pulmonary infiltrates consistent pulmonary edema. Bilateral pleural effusions. No pneumothorax. IMPRESSION: 1. Lines and tubes in stable position. 2. 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 Prior cardiac  valve replacement. Cardiomegaly with bilateral pulmonary infiltrates consistent with pulmonary edema noted on today's exam. Bilateral pleural effusions. 3. Persistent basilar atelectasis. 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  ------------------------------------------------------------------- Study Conclusions  - Left ventricle: The cavity size was normal. Wall thickness was   increased in a pattern of severe LVH. Systolic function was   normal. The estimated ejection fraction was in the range of 55%   to 60%. - Aortic valve: Valve area (VTI): 4.29 cm^2. Valve area (Vmax):   4.15 cm^2. Valve area (Vmean): 3.88 cm^2. - Mitral valve: Severely calcified annulus. Valve area by   continuity equation (using LVOT flow): 3.35 cm^2. - Pericardium, extracardiac: Localized moderate pericardial   effusion posterior to the LA.  ------------------------------------------------------------------- Study data:  Comparison was made to the study of 09/20/2015.  Study status:  Routine.  Procedure:  The patient was unable to respond to pain level query due to mechanical ventilation. The patient would not grade their pain level when asked, but did not appear in distress pre or post test. Transthoracic echocardiography. Image quality was adequate.  Study completion:  There were no complications.          Transthoracic echocardiography.  M-mode, complete 2D, spectral Doppler, and color Doppler.  Birthdate: Patient birthdate: 19-Mar-1950.  Age:  Patient is 66 yr old.  Sex: Gender: male.    BMI: 31.7 kg/m^2.  Blood pressure:     88/61 Patient status:  Inpatient.  Study date:  Study date: 09/16/2016. Study time: 10:30 AM.  Location:   ICU/CCU  -------------------------------------------------------------------  ------------------------------------------------------------------- Left ventricle:  The cavity size was normal. Wall thickness was increased in a pattern of severe LVH. Systolic function was normal. The estimated ejection fraction was in the range of 55% to 60%.  ------------------------------------------------------------------- Aortic valve:  Normal appearing tissue bio prosthetic valve with no perivalvular regurgitation  Doppler:     VTI ratio of LVOT to aortic valve: 0.87. Valve area (VTI): 4.29 cm^2. Indexed valve area (VTI): 1.94 cm^2/m^2. Peak velocity ratio of LVOT to aortic valve: 0.84. Valve area (Vmax): 4.15 cm^2. Indexed valve area (Vmax): 1.88 cm^2/m^2. Mean velocity ratio of LVOT to aortic valve: 0.79. Valve area (Vmean): 3.88 cm^2. Indexed valve area (Vmean): 1.76 cm^2/m^2.    Mean gradient (S): 7 mm Hg. Peak gradient (S): 16 mm Hg.  ------------------------------------------------------------------- Aorta:  The aorta was poorly visualized.  ------------------------------------------------------------------- Mitral valve:   Severely calcified annulus.  Doppler:  There was no significant regurgitation.    Valve area by continuity equation (using LVOT flow): 3.35 cm^2. Indexed valve area by continuity equation (using LVOT flow): 1.52 cm^2/m^2.    Mean gradient (D): 2 mm Hg. Peak gradient (D): 3 mm Hg.  ------------------------------------------------------------------- Atrial septum:  Poorly visualized.  ------------------------------------------------------------------- Right ventricle:  The cavity size was normal. Wall thickness was normal. Pacer wire or catheter noted in right ventricle. Systolic function was normal.  ------------------------------------------------------------------- Pulmonic valve:    Doppler:  There was mild  regurgitation.  ------------------------------------------------------------------- Tricuspid valve:   Doppler:  There was mild regurgitation.  ------------------------------------------------------------------- Right atrium:  The atrium was normal in size.  ------------------------------------------------------------------- Pericardium:  Localized moderate pericardial effusion posterior to the LA.  ------------------------------------------------------------------- Post procedure conclusions Ascending Aorta:  - The aorta was poorly visualized.  ------------------------------------------------------------------- Measurements   Left ventricle  Value          Reference  LV ID, ED, PLAX chordal                  43    mm       43 - 52  LV ID, ES, PLAX chordal                  30    mm       23 - 38  LV fx shortening, PLAX chordal           30    %        >=29  LV PW thickness, ED                      9     mm       ----------  IVS/LV PW ratio, ED                      1.22           <=1.3  Stroke volume, 2D                        89    ml       ----------  Stroke volume/bsa, 2D                    40    ml/m^2   ----------  LV e&', lateral                           5.63  cm/s     ----------  LV E/e&', lateral                         15.45          ----------  LV e&', medial                            7.26  cm/s     ----------  LV E/e&', medial                          11.98          ----------  LV e&', average                           6.45  cm/s     ----------  LV E/e&', average                         13.5           ----------    Ventricular septum                       Value          Reference  IVS thickness, ED                        11    mm       ----------    LVOT  Value          Reference  LVOT ID, S                               25    mm       ----------  LVOT area                                4.91  cm^2      ----------  LVOT peak velocity, S                    168   cm/s     ----------  LVOT mean velocity, S                    98.1  cm/s     ----------  LVOT VTI, S                              18.1  cm       ----------  LVOT peak gradient, S                    11    mm Hg    ----------    Aortic valve                             Value          Reference  Aortic valve peak velocity, S            199   cm/s     ----------  Aortic valve mean velocity, S            124   cm/s     ----------  Aortic valve VTI, S                      20.7  cm       ----------  Aortic mean gradient, S                  7     mm Hg    ----------  Aortic peak gradient, S                  16    mm Hg    ----------  VTI ratio, LVOT/AV                       0.87           ----------  Aortic valve area, VTI                   4.29  cm^2     ----------  Aortic valve area/bsa, VTI               1.94  cm^2/m^2 ----------  Velocity ratio, peak, LVOT/AV            0.84           ----------  Aortic valve area, peak velocity         4.15  cm^2     ----------  Aortic valve area/bsa, peak              1.88  cm^2/m^2 ----------  velocity  Velocity ratio, mean, LVOT/AV            0.79           ----------  Aortic valve area, mean velocity         3.88  cm^2     ----------  Aortic valve area/bsa, mean              1.76  cm^2/m^2 ----------  velocity    Aorta                                    Value          Reference  Aortic root ID, ED                       42    mm       ----------    Left atrium                              Value          Reference  LA ID, A-P, ES                           31    mm       ----------  LA ID/bsa, A-P                           1.4   cm/m^2   <=2.2    Mitral valve                             Value          Reference  Mitral E-wave peak velocity              87    cm/s     ----------  Mitral A-wave peak velocity              94.6  cm/s     ----------  Mitral mean velocity, D                  67.7   cm/s     ----------  Mitral deceleration time         (L)     140   ms       150 - 230  Mitral mean gradient, D                  2     mm Hg    ----------  Mitral peak gradient, D                  3     mm Hg    ----------  Mitral E/A ratio, peak                   0.9            ----------  Mitral valve area, LVOT                  3.35  cm^2     ----------  continuity  Mitral valve area/bsa, LVOT  1.52  cm^2/m^2 ----------  continuity  Mitral annulus VTI, D                    26.5  cm       ----------    Tricuspid valve                          Value          Reference  Tricuspid regurg peak velocity           244   cm/s     ----------  Tricuspid peak RV-RA gradient            24    mm Hg    ----------    Right atrium                             Value          Reference  RA ID, S-I, ES, A4C                      45.8  mm       34 - 49  RA area, ES, A4C                         12.2  cm^2     8.3 - 19.5  RA volume, ES, A/L                       26.8  ml       ----------  RA volume/bsa, ES, A/L                   12.1  ml/m^2   ----------    Right ventricle                          Value          Reference  TAPSE                                    13.7  mm       ----------  RV s&', lateral, S                        10.4  cm/s     ----------  Legend: (L)  and  (H)  mark values outside specified reference range.  ------------------------------------------------------------------- Prepared and Electronically Authenticated by  Jenkins Rouge, M.D. 2018-08-22T11:46:38   Assessment/Plan: S/P Procedure(s) (LRB): AORTIC VALVE REPLACEMENT (AVR) (N/A) ASCENDING AORTIC ROOT REPLACEMENT (N/A) TRANSESOPHAGEAL ECHOCARDIOGRAM (TEE) (N/A) Renal function stable cr decreased from yesterday  ekg- diffuse st elevation pericarditis- check troponin Cox 51,   On decreased levels of epi and levophed  No evidence of right heart failure on echo, sever lvh but intact lv function  Mild  diuresis , wean vent to extubation as tolerated  Avoid  Heparin at this point with border line plt levels    Grace Isaac 09/17/2016 8:09 AM

## 2016-09-18 ENCOUNTER — Inpatient Hospital Stay (HOSPITAL_COMMUNITY): Payer: Medicare Other

## 2016-09-18 LAB — CBC
HCT: 24.8 % — ABNORMAL LOW (ref 39.0–52.0)
Hemoglobin: 8.4 g/dL — ABNORMAL LOW (ref 13.0–17.0)
MCH: 30.5 pg (ref 26.0–34.0)
MCHC: 33.9 g/dL (ref 30.0–36.0)
MCV: 90.2 fL (ref 78.0–100.0)
Platelets: 48 10*3/uL — ABNORMAL LOW (ref 150–400)
RBC: 2.75 MIL/uL — ABNORMAL LOW (ref 4.22–5.81)
RDW: 15.3 % (ref 11.5–15.5)
WBC: 11 10*3/uL — ABNORMAL HIGH (ref 4.0–10.5)

## 2016-09-18 LAB — COMPREHENSIVE METABOLIC PANEL
ALT: 17 U/L (ref 17–63)
AST: 41 U/L (ref 15–41)
Albumin: 3 g/dL — ABNORMAL LOW (ref 3.5–5.0)
Alkaline Phosphatase: 31 U/L — ABNORMAL LOW (ref 38–126)
Anion gap: 11 (ref 5–15)
BUN: 20 mg/dL (ref 6–20)
CO2: 24 mmol/L (ref 22–32)
Calcium: 8 mg/dL — ABNORMAL LOW (ref 8.9–10.3)
Chloride: 104 mmol/L (ref 101–111)
Creatinine, Ser: 0.81 mg/dL (ref 0.61–1.24)
GFR calc Af Amer: >60 mL/min (ref >60)
GFR calc non Af Amer: >60 mL/min (ref >60)
Glucose, Bld: 120 mg/dL — ABNORMAL HIGH (ref 65–99)
Potassium: 4.1 mmol/L (ref 3.5–5.1)
Sodium: 139 mmol/L (ref 135–145)
Total Bilirubin: 1.5 mg/dL — ABNORMAL HIGH (ref 0.3–1.2)
Total Protein: 5.3 g/dL — ABNORMAL LOW (ref 6.5–8.1)

## 2016-09-18 LAB — GLUCOSE, CAPILLARY
Glucose-Capillary: 138 mg/dL — ABNORMAL HIGH (ref 65–99)
Glucose-Capillary: 125 mg/dL — ABNORMAL HIGH (ref 65–99)
Glucose-Capillary: 115 mg/dL — ABNORMAL HIGH (ref 65–99)
Glucose-Capillary: 127 mg/dL — ABNORMAL HIGH (ref 65–99)

## 2016-09-18 LAB — COOXEMETRY PANEL
Carboxyhemoglobin: 1.5 % (ref 0.5–1.5)
Methemoglobin: 1.2 % (ref 0.0–1.5)
O2 Saturation: 81.3 %
Total hemoglobin: 8.1 g/dL — ABNORMAL LOW (ref 12.0–16.0)

## 2016-09-18 LAB — TROPONIN I
Troponin I: 2.39 ng/mL (ref <0.03)
Troponin I: 2.77 ng/mL (ref <0.03)

## 2016-09-18 MED ORDER — OXYCODONE HCL 5 MG PO TABS
5.0000 mg | ORAL_TABLET | ORAL | Status: DC | PRN
  Administered 2016-09-22 – 2016-09-23 (×2): 5 mg via ORAL
  Filled 2016-09-18 (×2): qty 1

## 2016-09-18 MED ORDER — TRAMADOL HCL 50 MG PO TABS
50.0000 mg | ORAL_TABLET | ORAL | Status: DC | PRN
  Administered 2016-09-19 – 2016-09-21 (×7): 50 mg via ORAL
  Filled 2016-09-18 (×9): qty 1

## 2016-09-18 MED ORDER — CHLORHEXIDINE GLUCONATE CLOTH 2 % EX PADS: 6.0000 | MEDICATED_PAD | Freq: Every day | CUTANEOUS | Status: DC

## 2016-09-18 MED ORDER — FUROSEMIDE 10 MG/ML IJ SOLN
40.0000 mg | Freq: Two times a day (BID) | INTRAMUSCULAR | Status: AC
  Administered 2016-09-18 – 2016-09-19 (×3): 40 mg via INTRAVENOUS
  Filled 2016-09-18 (×3): qty 4

## 2016-09-18 MED ORDER — SODIUM CHLORIDE 0.9% FLUSH
10.0000 mL | INTRAVENOUS | Status: DC | PRN
  Administered 2016-09-22: 20 mL
  Filled 2016-09-18: qty 40

## 2016-09-18 MED ORDER — AMIODARONE HCL IN DEXTROSE 360-4.14 MG/200ML-% IV SOLN
30.0000 mg/h | INTRAVENOUS | Status: DC
  Administered 2016-09-19 – 2016-09-20 (×3): 30 mg/h via INTRAVENOUS
  Filled 2016-09-18 (×3): qty 200

## 2016-09-18 MED ORDER — ASPIRIN EC 81 MG PO TBEC
81.0000 mg | DELAYED_RELEASE_TABLET | Freq: Every day | ORAL | Status: DC
  Administered 2016-09-18 – 2016-09-23 (×6): 81 mg via ORAL
  Filled 2016-09-18 (×6): qty 1

## 2016-09-18 MED ORDER — FOLIC ACID 1 MG PO TABS
1.0000 mg | ORAL_TABLET | Freq: Every day | ORAL | Status: DC
  Administered 2016-09-18 – 2016-09-23 (×6): 1 mg via ORAL
  Filled 2016-09-18 (×6): qty 1

## 2016-09-18 MED ORDER — SODIUM CHLORIDE 0.9% FLUSH
10.0000 mL | Freq: Two times a day (BID) | INTRAVENOUS | Status: DC
  Administered 2016-09-19: 10 mL

## 2016-09-18 MED ORDER — AMIODARONE LOAD VIA INFUSION
150.0000 mg | Freq: Once | INTRAVENOUS | Status: AC
  Administered 2016-09-18: 150 mg via INTRAVENOUS
  Filled 2016-09-18: qty 83.34

## 2016-09-18 MED ORDER — ASPIRIN 81 MG PO CHEW: 81.0000 mg | CHEWABLE_TABLET | Freq: Every day | ORAL | Status: DC

## 2016-09-18 MED ORDER — CHLORHEXIDINE GLUCONATE CLOTH 2 % EX PADS
6.0000 | MEDICATED_PAD | Freq: Every day | CUTANEOUS | Status: DC
  Administered 2016-09-18 – 2016-09-20 (×3): 6 via TOPICAL

## 2016-09-18 MED ORDER — AMIODARONE HCL IN DEXTROSE 360-4.14 MG/200ML-% IV SOLN
60.0000 mg/h | INTRAVENOUS | Status: AC
  Administered 2016-09-18 (×2): 60 mg/h via INTRAVENOUS
  Filled 2016-09-18 (×2): qty 200

## 2016-09-18 NOTE — Progress Notes (Addendum)
Pt ambulated 190 ft with Ava walker. HR increased up to 140's. Pt assisted to chair and given O2 via [place] at 1L. Lopressor 5mg  IV given. Will continue to monitor.  HR irregular, unable to tell if A fib or ST with frequent PACs. STAT EKG ordered @1500 .

## 2016-09-18 NOTE — Progress Notes (Signed)
T[facility] BRIEF SICU PROGRESS NOTE  3 Days Post-Op  S/P Procedure(s) (LRB): AORTIC VALVE REPLACEMENT (AVR) (N/A) ASCENDING AORTIC ROOT REPLACEMENT (N/A) TRANSESOPHAGEAL ECHOCARDIOGRAM (TEE) (N/A)   Went into Afib earlier this afternoon, now back in NSR on IV amiodarone Otherwise stable day  Plan: Continue current plan  Rexene Alberts, MD 09/18/2016 6:20 PM

## 2016-09-18 NOTE — Progress Notes (Signed)
Called the floor RN if I can come and place the PICC line. Per RN patient wants to rest right now, tired from ambulating. Will follow up it tonight.

## 2016-09-18 NOTE — Plan of Care (Signed)
Problem: Bowel/Gastric: Goal: Gastrointestinal status for postoperative course will improve Outcome: Progressing Pt with abdominal sounds and small BM  Problem: Cardiac: Goal: Hemodynamic stability will improve Outcome: Progressing Levo and Neo weaned off Goal: Ability to maintain an adequate cardiac output will improve Outcome: Progressing Pt with good urine output. Swan d/ced Goal: Will show no signs and symptoms of excessive bleeding Outcome: Progressing Minimal ctd  Problem: Nutritional: Goal: Risk for body nutrition deficit will decrease Outcome: Progressing Pt starting PO's  Problem: Physical Regulation: Goal: Diagnostic test results will improve Outcome: Progressing Labs wnl  Problem: Respiratory: Goal: Levels of oxygenation will improve Outcome: Progressing Pt extubated Goal: Respiratory status will improve Outcome: Progressing Pt extubated .weaning O2 Goal: Ability to tolerate decreased levels of ventilator support will improve Outcome: Completed/Met Date Met: 09/18/16 Pt extubated  Problem: Pain Management: Goal: Pain level will decrease Outcome: Progressing Denies pain  Problem: Tissue Perfusion: Goal: Risk of venous thrombosis will decrease Outcome: Progressing No S&S DVT

## 2016-09-18 NOTE — Progress Notes (Signed)
Peripherally Inserted Central Catheter/Midline Placement  The IV Nurse has discussed with the patient and/or persons authorized to consent for the patient, the purpose of this procedure and the potential benefits and risks involved with this procedure.  The benefits include less needle sticks, lab draws from the catheter, and the patient may be discharged home with the catheter. Risks include, but not limited to, infection, bleeding, blood clot (thrombus formation), and puncture of an artery; nerve damage and irregular heartbeat and possibility to perform a PICC exchange if needed/ordered by physician.  Alternatives to this procedure were also discussed.  [facility] Power PICC patient education guide, fact sheet on infection prevention and patient information card has been provided to patient /or left at bedside.    PICC/Midline Placement Documentation    Jaelynn Currier, Nicolette Bang 09/18/2016, 8:54 PM

## 2016-09-18 NOTE — Op Note (Signed)
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  PROCEDURES:  Biologic Bentall with replacement of aortic valve with [employer] pericardial tissue valve, model 3300TFX 29 mm, serial #[id number], and replacement of the aortic root and ascending aorta with a 32 mm Gelweave Valsalva graft and reimplantation of the right and left coronary buttons. SURGEON: Grace Isaac MD FIRST ASSISTANT:  [name], P.A. - BRIEF HISTORY:  The patient is a 66 year old male who first presented to Cardiac Surgery at the time of her preop evaluation for hip replacement. At that time, he was noted to have a dilated aorta.  This led to further evaluation including echocardiogram and ultimately cardiac catheterization confirming that the patient had severe aortic insufficiency with a dilated aortic root and proximal ascending aorta. The patient had evidence of preserved LV function, but with evidence of left ventricular hypertrophy.  He was symptomatic with increasing shortness of breath with exertion.  After significant time of evaluating the patient and delays by the patient for various reasons, he ultimately agreed to proceed with resection.  Cardiac catheterization revealed a very small nondominant right coronary artery and a large dominant left system without significant disease.  CTA of the chest confirmed the enlarged aortic root and proximal ascending aorta, which then tapered to 3.8 cm distal ascending aorta arch and descending aorta.  The patient required dental extractions  prior to surgery, which were performed. Risks and options were discussed with the patient in detail and he agreed and signed informed consent.  Discussion of use of mechanical versus tissue valves were discussed with the patient and he preferred not to take Coumadin.  DESCRIPTION OF PROCEDURE:  With Swan-Ganz and arterial line monitors in place, the patient underwent general endotracheal anesthesia without incident.  Skin of the chest and legs was prepped with Betadine and draped in usual sterile manner.  Appropriate time-out was performed and then we proceeded with median sternotomy.  Pericardium was opened.  This gave good exposure to findings consistent with what was evident on CT and TEE.  The patient was noted to have severe aortic insufficiency, dilated aortic root, and trace to mild mitral insufficiency.  The aneurysmal dilatation of the aorta was mostly proximal and mid ascending aorta tapered to 3.8 cm.  We decided to proceed with cannulation of the very distal ascending aorta.  The patient was systemically heparinized. The ascending aorta was cannulated.  Dual stage venous cannula was placed in the right atrium.  Retrograde cardioplegia catheter was placed and position confirmed by TEE.  The patient was then placed on cardiopulmonary bypass 2.4 L/min/m2.  A right superior pulmonary vein vent was placed.  The patient's body temperature was then cooled to 32 degrees.  An aortic root vent cardioplegia needle was introduced into the ascending aorta.  Aortic crossclamp was applied to the distal ascending aorta.  Initially, cold blood cardioplegia was administered antegrade until there was cardiac arrest and we then switched to retrograde cardioplegia, total of 800 mL.  With the heart satisfactorily arrested, we then removed the aortic root vent and opened the ascending aorta just at the top of the sinuses of Valsalva as the aorta tapered to a more normal size.  This gave good  visualization of a severely dilated aortic root, at least 6 cm.  The right coronary ostium was small.  The left main coronary ostium was easily visualized and was a large vessel. Valve was a trileaflet valve.  The leaflets were severely dilated and stretched and thinned out.  We then planned to proceed with original plan of biologic Bentall.  Aortic leaflets were excised.  Aortic root was also resected, dissecting out and mobilizing the right and left coronary buttons.  The anulus sized for a 29 pericardial tissue valve. A 32 Valsalva graft was selected.  #2 Tycron pledgeted sutures were placed circumferentially around the aortic annulus with the pledgets on the outside surface.  The tissue valve was placed inside Valsalva graft and the sutures were placed through the sewing ring of the valve and the Valsalva graft.  This was then used to seat the valve and the graft into the annulus.  The distal graft was divided of excessive valve length. We then proceeded with opening with our cautery ostium for the left coronary button.  This was then anastomosed to the graft with a running 5-0 Prolene.  The distal graft was then divided and the remainder of the ascending aorta was divided up to the cross-clamp.  The distal anastomosis was then performed after trimming the distal graft to the appropriate length.  The distal anastomosis was performed with a running 3-0 Prolene suture.  Intermittently during the procedure, retrograde cold blood cardioplegia was administered.  With the proximal and distal anastomoses completed, we then opened the graft at the anterior most sinus creating an opening and anastomosed the right coronary button to the graft.  Aortic root vent cardioplegia needle was placed back into the graft to further assist in de-airing the heart.  The heart was allowed for completion of the right coronary button.  The heart was allowed to passively fill and de-air.  The anastomosis was  completed. Additional air was vented from the aorta through the aortic root vent as the cross-clamp was removed.  Prior to removal of the cross-clamp, a dose of warm retrograde cardioplegia was administered.  Total cross- clamp was 162 minutes.  We then began rewarming the patient.  He spontaneously converted to a sinus rhythm.  Atrial and ventricular pacing wires were applied.  With the patient rewarmed to 37 degrees, the retrograde cardioplegia catheter had been removed, TEE showed good functioning of the ventricle and valves.  The right superior pulmonary vein vent was removed.  The patient was then weaned from cardiopulmonary bypass on low-dose dopamine.  He remained hemodynamically stable.  He was decannulated in usual fashion.  Protamine sulfate was administered. In addition, because of coagulopathy and measured platelet count in the 50,000s, fresh frozen and plateletpheresis were administered.  Because of low hematocrit on pump, the patient had been given 2 units of packed red blood cells.  With administration of blood products and protamine, the operative field was hemostatic.  Two Blake drains were left in the pericardium.  Pericardium was loosely reapproximated.  Sternum was then closed with #6 stainless steel wire.  Fascia closed with interrupted 0 Vicryl and 3-0 Vicryl in subcutaneous tissue, 4-0 subcuticular stitch in skin edges.  Dry dressings were applied.  Sponge and needle counts were reported as correct at the completion of procedure.  The patient tolerated the procedure without obvious complication and was transferred to the Surgical Intensive Care Unit.  Total pump time was 212 minutes.     Lanelle Bal, MD   ______________________________ Lanelle Bal, MD    EG/MEDQ  D:  09/18/2016  T:  09/18/2016  Job:  [id number]

## 2016-09-18 NOTE — Progress Notes (Signed)
Patient ID: Lonnie Hansen, male   DOB: [birth year], 66 y.o.   MRN: [id number] [facility] DAILY ICU PROGRESS NOTE                   [address]            [place], [address]          [phone]   3 Days Post-Op Procedure(s) (LRB): AORTIC VALVE REPLACEMENT (AVR) (N/A) ASCENDING AORTIC ROOT REPLACEMENT (N/A) TRANSESOPHAGEAL ECHOCARDIOGRAM (TEE) (N/A)  Total Length of Stay:  LOS: 3 days   Subjective: Awake and alert , neuro intact, resp status good   Objective: Vital signs in last 24 hours: Temp:  [98.8 F (37.1 C)-100.2 F (37.9 C)] 99.1 F (37.3 C) (08/24 0833) Pulse Rate:  [76-96] 82 (08/24 0833) Cardiac Rhythm: Normal sinus rhythm (08/24 0800) Resp:  [10-27] 23 (08/24 0833) BP: (94-135)/(65-87) 132/80 (08/24 0833) SpO2:  [94 %-100 %] 97 % (08/24 0833) Arterial Line BP: (100-151)/(54-75) 127/63 (08/24 0800) Weight:  [219 lb 9.3 oz (99.6 kg)] 219 lb 9.3 oz (99.6 kg) (08/24 0700)  Filed Weights   09/16/16 0815 09/18/16 0500 09/18/16 0700  Weight: 214 lb 11.7 oz (97.4 kg) 219 lb 9.3 oz (99.6 kg) 219 lb 9.3 oz (99.6 kg)    Weight change: 4 lb 13.6 oz (2.2 kg)   Hemodynamic parameters for last 24 hours: PAP: (23-36)/(12-21) 36/20 CVP:  [8 mmHg-19 mmHg] 19 mmHg  Intake/Output from previous day: 08/23 0701 - 08/24 0700 In: 1267 [P.O.:180; I.V.:847; NG/GT:240] Out: 3876 [Urine:3775; Stool:101]  Intake/Output this shift: Total I/O In: 21.8 [I.V.:21.8] Out: 195 [Urine:175; Chest Tube:20]  Current Meds: Scheduled Meds: . acetaminophen  1,000 mg Oral Q6H   Or  . acetaminophen (TYLENOL) oral liquid 160 mg/5 mL  1,000 mg Per Tube Q6H  . aspirin EC  81 mg Oral Daily   Or  . aspirin  81 mg Per Tube Daily  . bisacodyl  10 mg Oral Daily   Or  . bisacodyl  10 mg Rectal Daily  . chlorhexidine gluconate (MEDLINE KIT)  15 mL Mouth Rinse BID  . docusate sodium  200 mg Oral Daily  . furosemide  40 mg Intravenous Q12H  . insulin detemir  15 Units Subcutaneous  Daily  . insulin regular  0-10 Units Intravenous TID WC  . latanoprost  1 drop Both Eyes QHS  . mouth rinse  15 mL Mouth Rinse 10 times per day  . metoprolol tartrate  12.5 mg Oral BID   Or  . metoprolol tartrate  12.5 mg Per Tube BID  . pantoprazole  40 mg Oral Daily  . sodium chloride flush  3 mL Intravenous Q12H   Continuous Infusions: . sodium chloride 10 mL/hr at 09/17/16 2000  . sodium chloride 10 mL/hr at 09/16/16 2141  . DOPamine 2.5 mcg/kg/min (09/18/16 0800)  . epinephrine 2 mcg/min (09/18/16 0800)  . insulin (NOVOLIN-R) infusion Stopped (09/17/16 1800)  . lactated ringers    . lactated ringers 10 mL/hr at 09/18/16 0800  . lactated ringers    . nitroGLYCERIN Stopped (09/15/16 1545)  . norepinephrine (LEVOPHED) Adult infusion Stopped (09/17/16 1900)  . phenylephrine (NEO-SYNEPHRINE) Adult infusion Stopped (09/15/16 2100)   PRN Meds:.sodium chloride, lactated ringers, metoprolol tartrate, morphine injection, ondansetron (ZOFRAN) IV, oxyCODONE, sodium chloride flush, traMADol  General appearance: alert, cooperative and no distress Neurologic: intact Heart: regular rate and rhythm, S1, S2 normal, no murmur, click, rub or gallop Lungs: diminished breath sounds bibasilar  Abdomen: soft, non-tender; bowel sounds normal; no masses,  no organomegaly Extremities: extremities normal, atraumatic, no cyanosis or edema and Homans sign is negative, no sign of DVT Wound: sternum stable  Lab Results: CBC: Recent Labs  09/17/16 1631 09/18/16 0420  WBC 11.2* 11.0*  HGB 8.8* 8.4*  HCT 25.5* 24.8*  PLT 51* 48*   BMET:  Recent Labs  09/17/16 1631 09/18/16 0420  NA 138 139  K 4.0 4.1  CL 106 104  CO2 25 24  GLUCOSE 109* 120*  BUN 19 20  CREATININE 0.91 0.81  CALCIUM 7.8* 8.0*    CMET: Lab Results  Component Value Date   WBC 11.0 (H) 09/18/2016   HGB 8.4 (L) 09/18/2016   HCT 24.8 (L) 09/18/2016   PLT 48 (L) 09/18/2016   GLUCOSE 120 (H) 09/18/2016   CHOL  04/28/2010      187        ATP III CLASSIFICATION:  <200     mg/dL   Desirable  200-239  mg/dL   Borderline High  >=240    mg/dL   High          TRIG 69 04/28/2010   HDL 54 04/28/2010   LDLCALC (H) 04/28/2010    119        Total Cholesterol/HDL:CHD Risk Coronary Heart Disease Risk Table                     Men   Women  1/2 Average Risk   3.4   3.3  Average Risk       5.0   4.4  2 X Average Risk   9.6   7.1  3 X Average Risk  23.4   11.0        Use the calculated Patient Ratio above and the CHD Risk Table to determine the patient's CHD Risk.        ATP III CLASSIFICATION (LDL):  <100     mg/dL   Optimal  100-129  mg/dL   Near or Above                    Optimal  130-159  mg/dL   Borderline  160-189  mg/dL   High  >190     mg/dL   Very High   ALT 17 09/18/2016   AST 41 09/18/2016   NA 139 09/18/2016   K 4.1 09/18/2016   CL 104 09/18/2016   CREATININE 0.81 09/18/2016   BUN 20 09/18/2016   CO2 24 09/18/2016   TSH 1.611 04/28/2010   INR 1.11 09/17/2016   HGBA1C 5.3 09/11/2016      PT/INR:  Recent Labs  09/17/16 0251  LABPROT 14.4  INR 1.11   Radiology: Dg Chest Port 1 View  Result Date: 09/18/2016 CLINICAL DATA:  Chest tubes in place. EXAM: PORTABLE CHEST 1 VIEW COMPARISON:  Chest x-ray from yesterday. FINDINGS: Interval removal of the endotracheal and NG tubes, as well as the Swan-Ganz catheter. The right internal jugular sheath remains in place with the tip in the proximal SVC. The mediastinal drainage tube is unchanged in position. Unchanged cardiomegaly and aneurysmal dilatation of the thoracic aorta. Prior aortic valve replacement. Hazy bilateral lower lung fields likely represent a combination of pleural fluid and atelectasis, unchanged. No pneumothorax. IMPRESSION: 1. Interval removal of endotracheal and NG tubes, as well as the Swan-Ganz catheter. Right internal jugular sheath and mediastinal chest tube are stable in position. 2. Unchanged bilateral pleural effusions  and  adjacent basilar atelectasis. 3. Unchanged aneurysmal dilatation of the thoracic aorta. Electronically Signed   By: Titus Dubin M.D.   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

## 2016-09-19 ENCOUNTER — Inpatient Hospital Stay (HOSPITAL_COMMUNITY): Payer: Medicare Other

## 2016-09-19 LAB — COMPREHENSIVE METABOLIC PANEL
ALT: 19 U/L (ref 17–63)
AST: 36 U/L (ref 15–41)
Albumin: 3.2 g/dL — ABNORMAL LOW (ref 3.5–5.0)
Alkaline Phosphatase: 37 U/L — ABNORMAL LOW (ref 38–126)
Anion gap: 8 (ref 5–15)
BUN: 17 mg/dL (ref 6–20)
CO2: 29 mmol/L (ref 22–32)
Calcium: 7.9 mg/dL — ABNORMAL LOW (ref 8.9–10.3)
Chloride: 100 mmol/L — ABNORMAL LOW (ref 101–111)
Creatinine, Ser: 0.81 mg/dL (ref 0.61–1.24)
GFR calc Af Amer: >60 mL/min (ref >60)
GFR calc non Af Amer: >60 mL/min (ref >60)
Glucose, Bld: 112 mg/dL — ABNORMAL HIGH (ref 65–99)
Potassium: 3.2 mmol/L — ABNORMAL LOW (ref 3.5–5.1)
Sodium: 137 mmol/L (ref 135–145)
Total Bilirubin: 0.9 mg/dL (ref 0.3–1.2)
Total Protein: 5.9 g/dL — ABNORMAL LOW (ref 6.5–8.1)

## 2016-09-19 LAB — CBC
HCT: 25.3 % — ABNORMAL LOW (ref 39.0–52.0)
Hemoglobin: 8.3 g/dL — ABNORMAL LOW (ref 13.0–17.0)
MCH: 29.5 pg (ref 26.0–34.0)
MCHC: 32.8 g/dL (ref 30.0–36.0)
MCV: 90 fL (ref 78.0–100.0)
Platelets: 56 10*3/uL — ABNORMAL LOW (ref 150–400)
RBC: 2.81 MIL/uL — ABNORMAL LOW (ref 4.22–5.81)
RDW: 14.9 % (ref 11.5–15.5)
WBC: 13.3 10*3/uL — ABNORMAL HIGH (ref 4.0–10.5)

## 2016-09-19 LAB — GLUCOSE, CAPILLARY
Glucose-Capillary: 106 mg/dL — ABNORMAL HIGH (ref 65–99)
Glucose-Capillary: 95 mg/dL (ref 65–99)
Glucose-Capillary: 98 mg/dL (ref 65–99)
Glucose-Capillary: 98 mg/dL (ref 65–99)

## 2016-09-19 LAB — COOXEMETRY PANEL
Carboxyhemoglobin: 1.2 % (ref 0.5–1.5)
Methemoglobin: 1.1 % (ref 0.0–1.5)
O2 Saturation: 50.6 %
Total hemoglobin: 11.7 g/dL — ABNORMAL LOW (ref 12.0–16.0)

## 2016-09-19 LAB — HEPARIN INDUCED PLATELET AB (HIT ANTIBODY): Heparin Induced Plt Ab: 0.142 OD (ref 0.000–0.400)

## 2016-09-19 MED ORDER — MOVING RIGHT ALONG BOOK
Freq: Once | Status: AC
  Administered 2016-09-19: 10:00:00
  Filled 2016-09-19: qty 1

## 2016-09-19 MED ORDER — INSULIN ASPART 100 UNIT/ML ~~LOC~~ SOLN
0.0000 [IU] | Freq: Three times a day (TID) | SUBCUTANEOUS | Status: DC
  Administered 2016-09-20: 2 [IU] via SUBCUTANEOUS

## 2016-09-19 MED ORDER — SODIUM CHLORIDE 0.9% FLUSH
3.0000 mL | Freq: Two times a day (BID) | INTRAVENOUS | Status: DC
  Administered 2016-09-19 – 2016-09-22 (×5): 3 mL via INTRAVENOUS

## 2016-09-19 MED ORDER — POTASSIUM CHLORIDE 10 MEQ/50ML IV SOLN
10.0000 meq | INTRAVENOUS | Status: AC
  Administered 2016-09-19 (×3): 10 meq via INTRAVENOUS
  Filled 2016-09-19 (×3): qty 50

## 2016-09-19 MED ORDER — SODIUM CHLORIDE 0.9% FLUSH: 3.0000 mL | INTRAVENOUS | Status: DC | PRN

## 2016-09-19 MED ORDER — SODIUM CHLORIDE 0.9 % IV SOLN: 250.0000 mL | INTRAVENOUS | Status: DC | PRN

## 2016-09-19 NOTE — Progress Notes (Signed)
[facility] BRIEF SICU PROGRESS NOTE  4 Days Post-Op  S/P Procedure(s) (LRB): AORTIC VALVE REPLACEMENT (AVR) (N/A) ASCENDING AORTIC ROOT REPLACEMENT (N/A) TRANSESOPHAGEAL ECHOCARDIOGRAM (TEE) (N/A)   Stable day  Plan: Continue current plan  Rexene Alberts, MD 09/19/2016 7:08 PM

## 2016-09-19 NOTE — Progress Notes (Signed)
      [address]       [place] [address]             [phone]        CARDIOTHORACIC SURGERY PROGRESS NOTE   R4 Days Post-Op Procedure(s) (LRB): AORTIC VALVE REPLACEMENT (AVR) (N/A) ASCENDING AORTIC ROOT REPLACEMENT (N/A) TRANSESOPHAGEAL ECHOCARDIOGRAM (TEE) (N/A)  Subjective: Feels tired.  Denies SOB, pain  Objective: Vital signs: BP Readings from Last 1 Encounters:  09/19/16 121/80   Pulse Readings from Last 1 Encounters:  09/19/16 79   Resp Readings from Last 1 Encounters:  09/19/16 (!) 21   Temp Readings from Last 1 Encounters:  09/19/16 99.1 F (37.3 C) (Oral)    Hemodynamics: Mixed venous co-ox 50.6%    Physical Exam:  Rhythm:   sinus  Breath sounds: clear  Heart sounds:  RRR  Incisions:  Dressing dry, intact  Abdomen:  Soft, non-distended, non-tender  Extremities:  Warm, well-perfused    Intake/Output from previous day: 08/24 0701 - 08/25 0700 In: 1568.5 [P.O.:780; I.V.:738.5; IV Piggyback:50] Out: 4967 [Urine:4430; Chest Tube:20] Intake/Output this shift: Total I/O In: 162 [I.V.:62; IV Piggyback:100] Out: 225 [Urine:225]  Lab Results:  CBC: Recent Labs  09/18/16 0420 09/19/16 0423  WBC 11.0* 13.3*  HGB 8.4* 8.3*  HCT 24.8* 25.3*  PLT 48* 56*    BMET:  Recent Labs  09/18/16 0420 09/19/16 0423  NA 139 137  K 4.1 3.2*  CL 104 100*  CO2 24 29  GLUCOSE 120* 112*  BUN 20 17  CREATININE 0.81 0.81  CALCIUM 8.0* 7.9*     PT/INR:   Recent Labs  09/17/16 0251  LABPROT 14.4  INR 1.11    CBG (last 3)   Recent Labs  09/18/16 2353 09/19/16 0344 09/19/16 0806  GLUCAP 106* 98 95    ABG    Component Value Date/Time   PHART 7.483 (H) 09/17/2016 1452   PCO2ART 35.6 09/17/2016 1452   PO2ART 63.0 (L) 09/17/2016 1452   HCO3 26.6 09/17/2016 1452   TCO2 28 09/17/2016 1452   ACIDBASEDEF 1.0 09/16/2016 1048   O2SAT 50.6 09/19/2016 0432    CXR: Mild CHF and bibasilar ATX  Assessment/Plan: S/P  Procedure(s) (LRB): AORTIC VALVE REPLACEMENT (AVR) (N/A) ASCENDING AORTIC ROOT REPLACEMENT (N/A) TRANSESOPHAGEAL ECHOCARDIOGRAM (TEE) (N/A)  Overall stable POD4 Currently maintaining NSR w/ stable BP on IV amiodarone and low dose dopamine Breathing comfortably w/ O2 sats 90-94% on 1 L/min Expected post op acute blood loss anemia, stable Expected post op volume excess, weight reportedly 10 lbs > preop Post op thrombocytopenia, improved Hypokalemia, induced by loop diuretics Physical deconditioning   Mobilize  Wean dopamine off  Continue IV amiodarone today  Consult PT   Rexene Alberts, MD 09/19/2016 9:38 AM

## 2016-09-20 LAB — BASIC METABOLIC PANEL
ANION GAP: 9 (ref 5–15)
BUN: 23 mg/dL — ABNORMAL HIGH (ref 6–20)
CALCIUM: 8.2 mg/dL — AB (ref 8.9–10.3)
CO2: 28 mmol/L (ref 22–32)
Chloride: 99 mmol/L — ABNORMAL LOW (ref 101–111)
Creatinine, Ser: 0.89 mg/dL (ref 0.61–1.24)
GFR calc non Af Amer: >60 mL/min (ref >60)
Glucose, Bld: 126 mg/dL — ABNORMAL HIGH (ref 65–99)
POTASSIUM: 3 mmol/L — AB (ref 3.5–5.1)
Sodium: 136 mmol/L (ref 135–145)

## 2016-09-20 LAB — CBC
HEMATOCRIT: 26.8 % — AB (ref 39.0–52.0)
HEMOGLOBIN: 8.7 g/dL — AB (ref 13.0–17.0)
MCH: 29.9 pg (ref 26.0–34.0)
MCHC: 32.5 g/dL (ref 30.0–36.0)
MCV: 92.1 fL (ref 78.0–100.0)
Platelets: 66 10*3/uL — ABNORMAL LOW (ref 150–400)
RBC: 2.91 MIL/uL — AB (ref 4.22–5.81)
RDW: 15 % (ref 11.5–15.5)
WBC: 11.2 10*3/uL — AB (ref 4.0–10.5)

## 2016-09-20 LAB — GLUCOSE, CAPILLARY
Glucose-Capillary: 90 mg/dL (ref 65–99)
Glucose-Capillary: 110 mg/dL — ABNORMAL HIGH (ref 65–99)
Glucose-Capillary: 106 mg/dL — ABNORMAL HIGH (ref 65–99)
Glucose-Capillary: 133 mg/dL — ABNORMAL HIGH (ref 65–99)

## 2016-09-20 LAB — COOXEMETRY PANEL
Carboxyhemoglobin: 2 % — ABNORMAL HIGH (ref 0.5–1.5)
Methemoglobin: 0.9 % (ref 0.0–1.5)
O2 Saturation: 59.9 %
Total hemoglobin: 9.1 g/dL — ABNORMAL LOW (ref 12.0–16.0)

## 2016-09-20 MED ORDER — POTASSIUM CHLORIDE CRYS ER 20 MEQ PO TBCR
20.0000 meq | EXTENDED_RELEASE_TABLET | Freq: Two times a day (BID) | ORAL | Status: DC
Start: 2016-09-21 — End: 2016-09-23
  Administered 2016-09-21 – 2016-09-23 (×5): 20 meq via ORAL
  Filled 2016-09-20 (×6): qty 1

## 2016-09-20 MED ORDER — ATENOLOL 12.5 MG HALF TABLET: 12.5000 mg | ORAL_TABLET | Freq: Two times a day (BID) | ORAL | Status: DC

## 2016-09-20 MED ORDER — FUROSEMIDE 40 MG PO TABS
40.0000 mg | ORAL_TABLET | Freq: Two times a day (BID) | ORAL | Status: DC
  Administered 2016-09-20 (×2): 40 mg via ORAL
  Filled 2016-09-20 (×2): qty 1

## 2016-09-20 MED ORDER — ATENOLOL 25 MG PO TABS
12.5000 mg | ORAL_TABLET | Freq: Two times a day (BID) | ORAL | Status: DC
  Administered 2016-09-20 – 2016-09-23 (×6): 12.5 mg via ORAL
  Filled 2016-09-20 (×7): qty 1

## 2016-09-20 MED ORDER — PANTOPRAZOLE SODIUM 20 MG PO TBEC: 20.0000 mg | DELAYED_RELEASE_TABLET | Freq: Every day | ORAL | Status: DC | PRN

## 2016-09-20 MED ORDER — AMLODIPINE BESYLATE 10 MG PO TABS
10.0000 mg | ORAL_TABLET | Freq: Every day | ORAL | Status: DC
  Administered 2016-09-20 – 2016-09-23 (×4): 10 mg via ORAL
  Filled 2016-09-20 (×4): qty 1

## 2016-09-20 MED ORDER — POTASSIUM CHLORIDE 10 MEQ/50ML IV SOLN
10.0000 meq | INTRAVENOUS | Status: AC
  Administered 2016-09-20 (×3): 10 meq via INTRAVENOUS
  Filled 2016-09-20 (×3): qty 50

## 2016-09-20 MED ORDER — AMIODARONE HCL 200 MG PO TABS
200.0000 mg | ORAL_TABLET | Freq: Two times a day (BID) | ORAL | Status: DC
  Administered 2016-09-20 – 2016-09-23 (×7): 200 mg via ORAL
  Filled 2016-09-20 (×7): qty 1

## 2016-09-20 NOTE — Evaluation (Signed)
Physical Therapy Evaluation Patient Details Name: Lonnie Hansen MRN: [id number] DOB: 03/02/1950 Today's Date: 09/20/2016   History of Present Illness  Admitted for Aortic Valve Replacement, Ascending Aortic Root Replacement;  has a pertinent past medical history of Anemia; Arthritis; Asymptomatic bilateral carotid artery stenosis (08/2015); Blood dyscrasia; Bruising; Cataracts, bilateral; Coronary artery disease; Diverticulosis; Enlarged aorta ([place]); Enlarged prostate; GERD (gastroesophageal reflux disease); Glaucoma; Headache; ; Hypertension; Joint pain;   Clinical Impression   Patient is s/p above surgery resulting in functional limitations due to the deficits listed below (see PT Problem List). Had just walked the unit with RN and Harmon Pier walker, so today's eval was a bit limited; currently needing mod asist to power up to stand; needs UE support in standing and walking due to R hip pain (plans for THA -- current problem was uncovered while getting cardiac clearance for THA);  Patient will benefit from skilled PT to increase their independence and safety with mobility to allow discharge to the venue listed below.       Follow Up Recommendations Home health PT;Supervision - Intermittent;Other (comment) (will monitor for progress)If slow progress, will consider post-acute rehab    Equipment Recommendations  Rolling walker with 5" wheels;3in1 (PT)    Recommendations for Other Services OT consult     Precautions / Restrictions Precautions Precautions: Sternal      Mobility  Bed Mobility               General bed mobility comments: up in chair upon my arrival  Transfers Overall transfer level: Needs assistance Equipment used: Rolling walker (2 wheeled) Transfers: Sit to/from Stand Sit to Stand: Mod assist         General transfer comment: Mod assist to power up to stand; Cues for sternal prec; difficulty using only LEs to power up due to L hip  pain  Ambulation/Gait Ambulation/Gait assistance: Min assist Ambulation Distance (Feet):  (march in place; had just walked the unit with RN) Assistive device: Rolling walker (2 wheeled)       General Gait Details: Painful R hip with weight bearing  Stairs            Wheelchair Mobility    Modified Rankin (Stroke Patients Only)       Balance                                             Pertinent Vitals/Pain Pain Assessment: 0-10 Pain Score: 6  Pain Location: surgical pain in chest; R hip pain with weight bearing Pain Descriptors / Indicators: Aching Pain Intervention(s): Monitored during session    Home Living Family/patient expects to be discharged to:: Private residence Living Arrangements: Spouse/significant other Available Help at Discharge: Family Type of Home: House Home Access: Stairs to enter Entrance Stairs-Rails: Right Entrance Stairs-Number of Steps: 4 Home Layout: Multi-level;Able to live on main level with bedroom/bathroom Home Equipment: Kasandra Knudsen - single point;Walker - 2 wheels      Prior Function Level of Independence: Independent with assistive device(s)         Comments: Cane prn dur to hip pain (he plans for a THA with Dr. Ninfa Linden)     Hand Dominance        Extremity/Trunk Assessment   Upper Extremity Assessment Upper Extremity Assessment: Generalized weakness (and sternal precautions)    Lower Extremity Assessment Lower Extremity Assessment: Generalized weakness  Communication   Communication: No difficulties  Cognition Arousal/Alertness: Awake/alert Behavior During Therapy: WFL for tasks assessed/performed Overall Cognitive Status: Within Functional Limits for tasks assessed                                        General Comments General comments (skin integrity, edema, etc.): VSS on Room Air    Exercises     Assessment/Plan    PT Assessment Patient needs continued PT  services  PT Problem List Decreased strength;Decreased range of motion;Decreased activity tolerance;Decreased balance;Decreased mobility;Decreased knowledge of use of DME;Decreased knowledge of precautions;Cardiopulmonary status limiting activity;Pain       PT Treatment Interventions DME instruction;Gait training;Stair training;Functional mobility training;Therapeutic activities;Therapeutic exercise;Balance training;Neuromuscular re-education;Patient/family education    PT Goals (Current goals can be found in the Care Plan section)  Acute Rehab PT Goals Patient Stated Goal: get the hip replacement and 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 : A Lot Difficulty sitting down on and standing up from a chair with arms (e.g., wheelchair, bedside commode, etc,.)?: Unable Help needed moving to and from a bed to chair (including a wheelchair)?: A Little Help needed walking in hospital room?: A Little Help needed climbing 3-5 steps with a railing? : A Lot 6 Click Score: 14    End of Session   Activity Tolerance: Patient tolerated treatment well Patient left: in chair;with call bell/phone within reach Nurse Communication: Mobility status PT Visit Diagnosis: Other abnormalities of gait and mobility (R26.89);Pain Pain - Right/Left: Right Pain - part of body: Hip    Time: 1025-8527 PT Time Calculation (min) (ACUTE ONLY): 20 min   Charges:   PT Evaluation $PT Eval Moderate Complexity: 1 Mod     PT G Codes:        Roney Marion,  PT  Acute Rehabilitation Services Pager [phone] Office [phone]   Colletta Maryland 09/20/2016, 1:18 PM

## 2016-09-20 NOTE — Progress Notes (Signed)
Pt transferred to 4E16. Rn Red at beside, pt hooked up to tele monitors and placed in bed. VSS. No complications. Eleonore Chiquito RN 2 Heart

## 2016-09-20 NOTE — Progress Notes (Addendum)
      [address]       [place] [address]             [phone]        CARDIOTHORACIC SURGERY PROGRESS NOTE   R5 Days Post-Op Procedure(s) (LRB): AORTIC VALVE REPLACEMENT (AVR) (N/A) ASCENDING AORTIC ROOT REPLACEMENT (N/A) TRANSESOPHAGEAL ECHOCARDIOGRAM (TEE) (N/A)  Subjective: Feels better.  No specific complaints except doesn't like the food  Objective: Vital signs: BP Readings from Last 1 Encounters:  09/20/16 (!) 151/96   Pulse Readings from Last 1 Encounters:  09/20/16 88   Resp Readings from Last 1 Encounters:  09/20/16 (!) 23   Temp Readings from Last 1 Encounters:  09/20/16 99.1 F (37.3 C) (Oral)    Hemodynamics:   Mixed venous co-ox 60%   Physical Exam:  Rhythm:   sinus  Breath sounds: clear  Heart sounds:  RRR  Incisions:  Clean and dry  Abdomen:  Soft, non-distended, non-tender  Extremities:  Warm, well-perfused    Intake/Output from previous day: 08/25 0701 - 08/26 0700 In: 1749.3 [P.O.:1160; I.V.:439.3; IV Piggyback:150] Out: 1350 [Urine:1350] Intake/Output this shift: Total I/O In: 33.4 [I.V.:33.4] Out: 100 [Urine:100]  Lab Results:  CBC: Recent Labs  09/19/16 0423 09/20/16 0344  WBC 13.3* 11.2*  HGB 8.3* 8.7*  HCT 25.3* 26.8*  PLT 56* 66*    BMET:  Recent Labs  09/19/16 0423 09/20/16 0344  NA 137 136  K 3.2* 3.0*  CL 100* 99*  CO2 29 28  GLUCOSE 112* 126*  BUN 17 23*  CREATININE 0.81 0.89  CALCIUM 7.9* 8.2*     PT/INR:  No results for input(s): LABPROT, INR in the last 72 hours.  CBG (last 3)   Recent Labs  09/19/16 0806 09/19/16 1208 09/20/16 0828  GLUCAP 95 98 90    ABG    Component Value Date/Time   PHART 7.483 (H) 09/17/2016 1452   PCO2ART 35.6 09/17/2016 1452   PO2ART 63.0 (L) 09/17/2016 1452   HCO3 26.6 09/17/2016 1452   TCO2 28 09/17/2016 1452   ACIDBASEDEF 1.0 09/16/2016 1048   O2SAT 59.9 09/20/2016 0355    CXR: n/a  Assessment/Plan: S/P Procedure(s)  (LRB): AORTIC VALVE REPLACEMENT (AVR) (N/A) ASCENDING AORTIC ROOT REPLACEMENT (N/A) TRANSESOPHAGEAL ECHOCARDIOGRAM (TEE) (N/A)  Overall stable POD5 Currently maintaining NSR w/ stable BP on IV amiodarone, BP trending up Breathing comfortably w/ O2 sats 94-95% on room air Expected post op acute blood loss anemia, stable Expected post op volume excess, weight down 2 lbs but still reportedly 8 lbs > preop Post op thrombocytopenia, improving Hypokalemia, induced by loop diuretics Physical deconditioning   Mobilize  Diuresis  Convert amiodarone to oral  Supplement potassium  Watch platelet count  Consult PT  Transfer stepdown   Rexene Alberts, MD 09/20/2016 9:47 AM

## 2016-09-21 ENCOUNTER — Inpatient Hospital Stay (HOSPITAL_COMMUNITY): Payer: Medicare Other

## 2016-09-21 LAB — CBC
HEMATOCRIT: 26.2 % — AB (ref 39.0–52.0)
HEMOGLOBIN: 8.5 g/dL — AB (ref 13.0–17.0)
MCH: 30.1 pg (ref 26.0–34.0)
MCHC: 32.4 g/dL (ref 30.0–36.0)
MCV: 92.9 fL (ref 78.0–100.0)
Platelets: 74 10*3/uL — ABNORMAL LOW (ref 150–400)
RBC: 2.82 MIL/uL — ABNORMAL LOW (ref 4.22–5.81)
RDW: 15.1 % (ref 11.5–15.5)
WBC: 11.1 10*3/uL — ABNORMAL HIGH (ref 4.0–10.5)

## 2016-09-21 LAB — BASIC METABOLIC PANEL
Anion gap: 10 (ref 5–15)
BUN: 16 mg/dL (ref 6–20)
CHLORIDE: 99 mmol/L — AB (ref 101–111)
CO2: 26 mmol/L (ref 22–32)
CREATININE: 0.94 mg/dL (ref 0.61–1.24)
Calcium: 8.1 mg/dL — ABNORMAL LOW (ref 8.9–10.3)
GFR calc non Af Amer: >60 mL/min (ref >60)
GLUCOSE: 110 mg/dL — AB (ref 65–99)
Potassium: 3.8 mmol/L (ref 3.5–5.1)
Sodium: 135 mmol/L (ref 135–145)

## 2016-09-21 LAB — GLUCOSE, CAPILLARY
Glucose-Capillary: 114 mg/dL — ABNORMAL HIGH (ref 65–99)
Glucose-Capillary: 101 mg/dL — ABNORMAL HIGH (ref 65–99)
Glucose-Capillary: 105 mg/dL — ABNORMAL HIGH (ref 65–99)
Glucose-Capillary: 116 mg/dL — ABNORMAL HIGH (ref 65–99)

## 2016-09-21 MED ORDER — FUROSEMIDE 40 MG PO TABS
40.0000 mg | ORAL_TABLET | Freq: Every day | ORAL | Status: DC
  Administered 2016-09-21 – 2016-09-23 (×3): 40 mg via ORAL
  Filled 2016-09-21 (×3): qty 1

## 2016-09-21 MED ORDER — ENSURE ENLIVE PO LIQD
237.0000 mL | Freq: Three times a day (TID) | ORAL | Status: DC
  Administered 2016-09-21 – 2016-09-23 (×5): 237 mL via ORAL

## 2016-09-21 MED ORDER — LISINOPRIL 10 MG PO TABS: 10.0000 mg | ORAL_TABLET | Freq: Every day | ORAL | Status: DC

## 2016-09-21 NOTE — Progress Notes (Signed)
Physical Therapy Treatment Patient Details Name: Lonnie Hansen MRN: [id number] DOB: 09/07/1950 Today's Date: 09/21/2016    History of Present Illness Admitted for Aortic Valve Replacement, Ascending Aortic Root Replacement;  has a pertinent past medical history of Anemia; Arthritis; Asymptomatic bilateral carotid artery stenosis (08/2015); Blood dyscrasia; Bruising; Cataracts, bilateral; Coronary artery disease; Diverticulosis; Enlarged aorta ([place]); Enlarged prostate; GERD (gastroesophageal reflux disease); Glaucoma; Headache; ; Hypertension; Joint pain;     PT Comments    Pt is progressing towards his goals but is limited by his R hip pain and it's management with medication. PT session occurred 1 hour after administration of pain medications and pt was so groggy he had difficulty safely participating in PT. PT focused on therapeutic hip exercises to help strengthen the muscles supporting the hip joint. Pt educated on need to strengthen his hip muscles to aid in hip stability and pain reduction until he can have his R hip replaced. Pt requires skilled PT to progress mobility and hip strengthening to aid in safe mobility in his home environment at discharge.     Follow Up Recommendations  Home health PT;Supervision - Intermittent;Other (comment) (will monitor for progress)     Equipment Recommendations  Rolling walker with 5" wheels;3in1 (PT)    Recommendations for Other Services OT consult     Precautions / Restrictions Precautions Precautions: Sternal Restrictions Weight Bearing Restrictions: Yes (sternal)    Mobility  Bed Mobility Overal bed mobility: Needs Assistance Bed Mobility: Sit to Supine       Sit to supine: Mod assist   General bed mobility comments: modA for LE mangement up into bed against gravity  Transfers Overall transfer level: Needs assistance Equipment used: Rolling walker (2 wheeled) Transfers: Sit to/from Stand Sit to Stand: Min assist          General transfer comment: minA for powerup, good use of sternal pillow, good hip placement at edge of chair and anterior tibial translation to facilitate power up   Ambulation/Gait Ambulation/Gait assistance: Min assist Ambulation Distance (Feet): 10 Feet Assistive device: Rolling walker (2 wheeled) Gait Pattern/deviations: Step-through pattern;Antalgic;Decreased weight shift to right Gait velocity: slowed Gait velocity interpretation: Below normal speed for age/gender General Gait Details: Painful R hip with weight bearing, minA for stabilizing with gait, decreased weightbearing on R LE       Balance Overall balance assessment: Needs assistance Sitting-balance support: No upper extremity supported;Feet supported Sitting balance-Leahy Scale: Fair Sitting balance - Comments: lethargic with pain medication   Standing balance support: Bilateral upper extremity supported Standing balance-Leahy Scale: Fair Standing balance comment: requires RW to maintain static standing balance                            Cognition Arousal/Alertness: Awake/alert Behavior During Therapy: WFL for tasks assessed/performed Overall Cognitive Status: Within Functional Limits for tasks assessed                                        Exercises General Exercises - Lower Extremity Quad Sets: AROM;Both;Supine;10 reps Long Arc Quad: AROM;Both;10 reps;Seated Hip ABduction/ADduction: AROM;Right;10 reps;Standing Straight Leg Raises: AROM;Right;10 reps;Standing Hip Flexion/Marching: AROM;Right;10 reps;Standing;Seated;Both Low Level/ICU Exercises Hip Extension: AROM;Right;10 reps;Standing Stabilized Bridging: AROM;10 reps;Supine    General Comments        Pertinent Vitals/Pain Pain Assessment: 0-10 Pain Score: 3  Pain Location: R hip pain Pain  Descriptors / Indicators: Aching Pain Intervention(s): Monitored during session;Premedicated before session           PT Goals  (current goals can now be found in the care plan section) Acute Rehab PT Goals Patient Stated Goal: get the hip replacement and 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 : A Lot Difficulty sitting down on and standing up from a chair with arms (e.g., wheelchair, bedside commode, etc,.)?: Unable Help needed moving to and from a bed to chair (including a wheelchair)?: A Little Help needed walking in hospital room?: A Little Help needed climbing 3-5 steps with a railing? : A Lot 6 Click Score: 14    End of Session Equipment Utilized During Treatment: Gait belt Activity Tolerance: Patient limited by lethargy;Patient limited by pain Patient left: with call bell/phone within reach;in bed;with family/visitor present Nurse Communication: Mobility status PT Visit Diagnosis: Other abnormalities of gait and mobility (R26.89);Pain Pain - Right/Left: Right Pain - part of body: Hip     Time: 7048-8891 PT Time Calculation (min) (ACUTE ONLY): 28 min  Charges:  $Therapeutic Exercise: 23-37 mins                    G Codes:       Shania Bjelland B. Migdalia Dk PT, DPT Acute Rehabilitation  [phone] Pager [phone]     [place] 09/21/2016, 4:32 PM

## 2016-09-21 NOTE — Progress Notes (Addendum)
Patient ambulated in hallway with nursing staff and rolling walker 250 feet back in room in chair will monitor patient. Shirah Roseman, Bettina Gavia rN

## 2016-09-21 NOTE — Progress Notes (Addendum)
[address]       [place] [address]             [phone]      6 Days Post-Op Procedure(s) (LRB): AORTIC VALVE REPLACEMENT (AVR) (N/A) ASCENDING AORTIC ROOT REPLACEMENT (N/A) TRANSESOPHAGEAL ECHOCARDIOGRAM (TEE) (N/A) Subjective: Feels pretty well overall   Objective: Vital signs in last 24 hours: Temp:  [98.2 F (36.8 C)-99.4 F (37.4 C)] 98.2 F (36.8 C) (08/27 0439) Pulse Rate:  [71-93] 92 (08/26 2131) Cardiac Rhythm: Normal sinus rhythm (08/27 0447) Resp:  [19-26] 22 (08/26 2131) BP: (121-151)/(79-96) 138/84 (08/26 2131) SpO2:  [94 %-97 %] 96 % (08/26 2131) Weight:  [212 lb 3.2 oz (96.3 kg)-212 lb 4.8 oz (96.3 kg)] 212 lb 3.2 oz (96.3 kg) (08/27 0439)  Hemodynamic parameters for last 24 hours:    Intake/Output from previous day: 08/26 0701 - 08/27 0700 In: 673.5 [P.O.:340; I.V.:83.5; IV Piggyback:250] Out: 7619 [Urine:1645] Intake/Output this shift: No intake/output data recorded.  General appearance: alert, cooperative and no distress Heart: regular rate and rhythm Lungs: mildly dim in bases Abdomen: benign Extremities: no edema Wound: incis healing well  Lab Results:  Recent Labs  09/20/16 0344 09/21/16 0249  WBC 11.2* 11.1*  HGB 8.7* 8.5*  HCT 26.8* 26.2*  PLT 66* 74*   BMET:  Recent Labs  09/20/16 0344 09/21/16 0249  NA 136 135  K 3.0* 3.8  CL 99* 99*  CO2 28 26  GLUCOSE 126* 110*  BUN 23* 16  CREATININE 0.89 0.94  CALCIUM 8.2* 8.1*    PT/INR: No results for input(s): LABPROT, INR in the last 72 hours. ABG    Component Value Date/Time   PHART 7.483 (H) 09/17/2016 1452   HCO3 26.6 09/17/2016 1452   TCO2 28 09/17/2016 1452   ACIDBASEDEF 1.0 09/16/2016 1048   O2SAT 59.9 09/20/2016 0355   CBG (last 3)   Recent Labs  09/20/16 1618 09/20/16 2116 09/21/16 0634  GLUCAP 106* 133* 105*    Meds Scheduled Meds: . amiodarone  200 mg Oral BID PC  . amLODipine  10 mg Oral Daily  . aspirin EC  81 mg Oral  Daily  . atenolol  12.5 mg Oral BID  . bisacodyl  10 mg Oral Daily   Or  . bisacodyl  10 mg Rectal Daily  . Chlorhexidine Gluconate Cloth  6 each Topical Daily  . docusate sodium  200 mg Oral Daily  . folic acid  1 mg Oral Daily  . furosemide  40 mg Oral BID  . insulin aspart  0-24 Units Subcutaneous TID AC & HS  . latanoprost  1 drop Both Eyes QHS  . pantoprazole  40 mg Oral Daily  . potassium chloride  20 mEq Oral BID  . sodium chloride flush  10-40 mL Intracatheter Q12H  . sodium chloride flush  3 mL Intravenous Q12H  . sodium chloride flush  3 mL Intravenous Q12H   Continuous Infusions: . sodium chloride     PRN Meds:.sodium chloride, metoprolol tartrate, morphine injection, ondansetron (ZOFRAN) IV, oxyCODONE, sodium chloride flush, sodium chloride flush, sodium chloride flush, traMADol  Xrays Dg Chest 2 View  Result Date: 09/21/2016 CLINICAL DATA:  Atelectasis. EXAM: CHEST  2 VIEW COMPARISON:  09/19/2016. FINDINGS: Right PICC line in stable position. Prior cardiac valve replacement. Stable cardiomegaly. Slight improvement of basilar atelectasis/infiltrates . Bilateral pleural effusions, improved from prior exam. No pneumothorax . IMPRESSION: 1. Right PICC line in stable position. 2.  Prior cardiac valve replacement.  Stable cardiomegaly. 3. Slight improvement of basilar atelectasis/ infiltrates. Bilateral pleural effusions improved. No pneumothorax . Electronically Signed   By: Marcello Moores  Register   On: 09/21/2016 06:46    Assessment/Plan: S/P Procedure(s) (LRB): AORTIC VALVE REPLACEMENT (AVR) (N/A) ASCENDING AORTIC ROOT REPLACEMENT (N/A) TRANSESOPHAGEAL ECHOCARDIOGRAM (TEE) (N/A)   1 doing well 2 hemodyn stable in sinus rhythm on low dose amiodarone, BP a little high at times- add low dose ACE-I 3 d/c insulin , A1C 5.3 preop 4 H/H stable, Platelets improved to 74K 5 renal fxn is normal 6 decrease lasix to q day 7 PT /cardiac rehab/routine pulm toilet   LOS: 6 days     Lonnie Hansen,Lonnie Hansen 09/21/2016  I have seen and examined the patient and agree with the assessment and plan as outlined.  However, Norvasc and Atenolol were just resumed yesterday.  Will hold off on adding a 3rd agent for BP at this time and monitor.  Rexene Alberts, MD 09/21/2016 8:50 AM

## 2016-09-21 NOTE — Care Management Important Message (Signed)
Important Message  Patient Details  Name: Lonnie Hansen MRN: [id number] Date of Birth: 08/17/1950   Medicare Important Message Given:  Yes    Orbie Pyo 09/21/2016, 1:59 PM

## 2016-09-21 NOTE — Progress Notes (Signed)
Patient encouraged to sit in chair by nursing staff. Patient declines at this time will monitor patient. Noreene Boreman, Bettina Gavia rN

## 2016-09-21 NOTE — Progress Notes (Signed)
CARDIAC REHAB PHASE I   PRE:  Rate/Rhythm: 85 SR  BP:  Supine:   Sitting: 119/87  Standing:    SaO2: 95%RA  MODE:  Ambulation: 260 ft   POST:  Rate/Rhythm: 107 ST    To bathroom after walk 0912-0940 Pt very tired and did not get sleep last night. Wife helped [place] encourage him to walk. Pt walked 260 ft on RA with gait belt use,rolling walker and asst x2. Pt needs hip surgery and is used to using a cane. He puts some pressure on arms with this hip issue. To bathroom after walk. Wife stated she would call for assistance as he usually needs a lot of time.        Graylon Good, RN BSN  09/21/2016 9:36 AM

## 2016-09-22 MED ORDER — PNEUMOCOCCAL VAC POLYVALENT 25 MCG/0.5ML IJ INJ: 0.5000 mL | INJECTION | INTRAMUSCULAR | Status: DC

## 2016-09-22 NOTE — Progress Notes (Signed)
EPWs pulled per protocol, instructed on 1hr of bedrest with VS monitoring.  No ectopy or other issues noted at this time.  Will continue to monitor.

## 2016-09-22 NOTE — Progress Notes (Signed)
CARDIAC REHAB PHASE I   PRE:  Rate/Rhythm: 84 SR    BP: sitting 117/86    SaO2: 94 RA  MODE:  Ambulation: 300 ft   POST:  Rate/Rhythm: 103 ST    BP: sitting 130/80     SaO2: 96 RA  Pt not feeling well after taking pills, c/o lightheadedness. He was able to stand and walk with RW, increased distance, used assist x2 for safety. Return to bed due to fatigue and hip pain. Very talkative after walk.  [place], ACSM 09/22/2016 12:16 PM

## 2016-09-22 NOTE — Discharge Instructions (Signed)
Aortic Valve Replacement, Care After °Refer to this sheet in the next few weeks. These instructions provide you with information about caring for yourself after your procedure. Your health care provider may also give you more specific instructions. Your treatment has been planned according to current medical practices, but problems sometimes occur. Call your health care provider if you have any problems or questions after your procedure. °What can I expect after the procedure? °After the procedure, it is common to have: °· Pain around your incision area. °· A small amount of blood or clear fluid coming from your incision. ° °Follow these instructions at home: °Eating and drinking ° °· Follow instructions from your health care provider about eating or drinking restrictions. °? Limit alcohol intake to no more than 1 drink per day for nonpregnant women and 2 drinks per day for men. One drink equals 12 oz of beer, 5 oz of wine, or 1½ oz of hard liquor. °? Limit how much caffeine you drink. Caffeine can affect your heart's rate and rhythm. °· Drink enough fluid to keep your urine clear or pale yellow. °· Eat a heart-healthy diet. This should include plenty of fresh fruits and vegetables. If you eat meat, it should be lean cuts. Avoid foods that are: °? High in salt, saturated fat, or sugar. °? Canned or highly processed. °? Fried. °Activity °· Return to your normal activities as told by your health care provider. Ask your health care provider what activities are safe for you. °· Exercise regularly once you have recovered, as told by your health care provider. °· Avoid sitting for more than 2 hours at a time without moving. Get up and move around at least once every 1-2 hours. This helps to prevent blood clots in the legs. °· Do not lift anything that is heavier than 10 lb (4.5 kg) until your health care provider approves. °· Avoid pushing or pulling things with your arms until your health care provider approves. This  includes pulling on handrails to help you climb stairs. °Incision care ° °· Follow instructions from your health care provider about how to take care of your incision. Make sure you: °? Wash your hands with soap and water before you change your bandage (dressing). If soap and water are not available, use hand sanitizer. °? Change your dressing as told by your health care provider. °? Leave stitches (sutures), skin glue, or adhesive strips in place. These skin closures may need to stay in place for 2 weeks or longer. If adhesive strip edges start to loosen and curl up, you may trim the loose edges. Do not remove adhesive strips completely unless your health care provider tells you to do that. °· Check your incision area every day for signs of infection. Check for: °? More redness, swelling, or pain. °? More fluid or blood. °? Warmth. °? Pus or a bad smell. °Medicines °· Take over-the-counter and prescription medicines only as told by your health care provider. °· If you were prescribed an antibiotic medicine, take it as told by your health care provider. Do not stop taking the antibiotic even if you start to feel better. °Travel °· Avoid airplane travel for as long as told by your health care provider. °· When you travel, bring a list of your medicines and a record of your medical history with you. Carry your medicines with you. °Driving °· Ask your health care provider when it is safe for you to drive. Do not drive until your health   care provider approves. °· Do not drive or operate heavy machinery while taking prescription pain medicine. °Lifestyle ° °· Do not use any tobacco products, such as cigarettes, chewing tobacco, or e-cigarettes. If you need help quitting, ask your health care provider. °· Resume sexual activity as told by your health care provider. Do not use medicines for erectile dysfunction unless your health care provider approves, if this applies. °· Work with your health care provider to keep your  blood pressure and cholesterol under control, and to manage any other heart conditions that you have. °· Maintain a healthy weight. °General instructions °· Do not take baths, swim, or use a hot tub until your health care provider approves. °· Do not strain to have a bowel movement. °· Avoid crossing your legs while sitting down. °· Check your temperature every day for a fever. A fever may be a sign of infection. °· If you are a woman and you plan to become pregnant, talk with your health care provider before you become pregnant. °· Wear compression stockings if your health care provider instructs you to do this. These stockings help to prevent blood clots and reduce swelling in your legs. °· Tell all health care providers who care for you that you have an artificial (prosthetic) aortic valve. If you have or have had heart disease or endocarditis, tell all health care providers about these conditions as well. °· Keep all follow-up visits as told by your health care provider. This is important. °Contact a health care provider if: °· You develop a skin rash. °· You experience sudden, unexplained changes in your weight. °· You have more redness, swelling, or pain around your incision. °· You have more fluid or blood coming from your incision. °· Your incision feels warm to the touch. °· You have pus or a bad smell coming from your incision. °· You have a fever. °Get help right away if: °· You develop chest pain that is different from the pain coming from your incision. °· You develop shortness of breath or difficulty breathing. °· You start to feel light-headed. °These symptoms may represent a serious problem that is an emergency. Do not wait to see if the symptoms will go away. Get medical help right away. Call your local emergency services (911 in the [place] Do not drive yourself to the hospital. °This information is not intended to replace advice given to you by your health care provider. Make sure you discuss any  questions you have with your health care provider. °Document Released: 07/31/2004 Document Revised: 06/20/2015 Document Reviewed: 12/16/2014 °[employer]vier Interactive Patient Education © 2017 [employer] ° °

## 2016-09-22 NOTE — Discharge Summary (Signed)
Physician Discharge Summary  Patient ID: Lonnie Hansen MRN: [id number] DOB/AGE: 08/03/1950 66 y.o.  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 S/P AVR 09/15/2016  . Coronary artery disease involving native coronary artery of native heart without angina pectoris 04/29/2016  . Aortic insufficiency 04/01/2016  . Thoracic aortic aneurysm ([place]) 09/18/2015  . HTN (hypertension) 09/18/2015  . Hyperlipidemia 09/18/2015  . Asymptomatic bilateral carotid artery stenosis 08/27/2015   History of Present Illness:  Patient has been followed in the office and worked up for avr /root replacement . When last seen he had active pustules in his scalp that were being treated. He completed a course of by mouth antibioticsand the area is completely healed . He has also completed dental extraction.  The patient was originally was having hip pain making it difficult for him to be active. He was seeing Dr. Ninfa Linden for consideration of right hip replacement. In preparation for this a chest x-ray was performed leading to a CT scan of the chest. The patient has a previous CT scan of the chest done during admission in 2012 for syncope. Since episode of syncope in 2012 he's had no repeat syncopal episodes, notes rare palpitations. He has no previous history of myocardial infarction. He has increasing SOB with activity.  His family history is significant for his father who had had a MI prior but "drop dead at home" at age 60 in 41. His mother died of lymphoma date 50 1 brother and 1 sister alive his brothers had a myocardial infarction in the past., He has no children. There is no other history can on Cerone calls her cousins of unexplained sudden death at a young age.  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  Current Activity/ Functional Status:  Patient is independent with mobility/ambulation, transfers, ADL's, IADL's. But does use a cane because of pain in the right hip   The patient was admitted electively for the surgical procedure.  Discharged Condition: good  Hospital Course: The patient was admitted electively and on 09/15/2016 he was taken the operating room where he underwent the below described procedure. He tolerated well was taken to the surgical intensive care unit in stable condition.  Postoperative hospital course:  The patient was extubated from the ventilator on postoperative day 2. He has remained neurologically intact. He does have some postoperative volume overload but is responding well to diuretics. He initially did have a postoperative thrombocytopenia and heparin was avoided. An H I T panel was sent and level was 0.142. Most recent platelet count is 74 K. He has been placed on aspirin. He did have positive troponins postoperative day 2 and peaked at 9.82. Creatinine is stable at 0.94. Blood sugars have been under adequate control. Blood pressures been somewhat elevated in he is stabilized on beta blocker and Norvasc. He did have postoperative atrial fibrillation but has stabilized to sinus rhythm on oral amiodarone. Incisions are noted be healing well. He is tolerating gradually increasing activity using standard protocols. Oxygen has been weaned and he maintains good saturations on room air. He had a swallow evaluation by speech therapy and ic able to eat regular diet with  Instructions on chewing and swallowing. At time of discharge is  felt to be quite stable.  Consults: None  Significant Diagnostic Studies: routine post -op labs/CXR  Treatments: surgery:  DATE OF PROCEDURE:  09/15/2016 DATE OF DISCHARGE:                              OPERATIVE REPORT   PREOPERATIVE DIAGNOSIS:  Dilated aortic root and  severe aortic insufficiency.  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  PROCEDURES:  Biologic [place] with replacement of aortic valve with Edwards Lifesciences pericardial tissue valve, model 3300TFX 29 mm, serial #[id number], and replacement of the aortic root and ascending aorta with a 32 mm Gelweave Valsalva graft and reimplantation of the right and left coronary buttons. SURGEON: Grace Isaac MD FIRST ASSISTANT:  [name], P.A.-C  Discharge Exam: Blood pressure 115/80, pulse 77, temperature 99.3 F (37.4 C), temperature source Oral, resp. rate 18, height 5\' 9"  (1.753 m), weight 210 lb 6.4 oz (95.4 kg), SpO2 96 %.  General appearance: alert, cooperative and no distress Heart: regular rate and rhythm Lungs: mildly dim in left base Abdomen: benign Extremities: no edema Wound: incis healing well Disposition: 01-Home or Self Care  Discharge Instructions    Amb Referral to Cardiac Rehabilitation    Complete by:  As directed    Diagnosis:  Valve Replacement   Valve:  Aortic Comment - c/ ascending aortic root replacement     Allergies as of 09/23/2016      Reactions   No Known Allergies       Medication List    STOP taking these medications   acetaminophen 500 MG tablet Commonly known as:  TYLENOL   oxyCODONE-acetaminophen 5-325 MG tablet Commonly known as:  PERCOCET     TAKE these medications   amiodarone 200 MG tablet Commonly known as:  PACERONE Take 1 tablet (200 mg total) by mouth 2 (two) times daily after a meal.   amLODipine 10 MG tablet Commonly known as:  NORVASC Take 10 mg by mouth daily.   aspirin 81 MG EC tablet Take 1 tablet (81 mg total) by mouth daily.   atenolol 25 MG tablet Commonly known as:  TENORMIN Take 0.5 tablets (12.5 mg total) by mouth 2 (two) times daily. What changed:  when to take this   folic acid 1 MG tablet Commonly known as:  FOLVITE Take 1 tablet (1 mg total) by mouth  daily.   furosemide 40 MG tablet Commonly known as:  LASIX Take 1 tablet (40 mg total) by mouth daily.   latanoprost 0.005 % ophthalmic solution Commonly known as:  XALATAN Place 1 drop into both eyes at bedtime.   multivitamin with minerals Tabs tablet Take 1 tablet by mouth daily.   pantoprazole 40 MG tablet Commonly known as:  PROTONIX Take 20 mg by mouth daily as needed (indigestion). Notes to patient:  Last dose today @ 10:05am   potassium chloride SA 20 MEQ tablet Commonly known as:  K-DUR,KLOR-CON Take 1 tablet (20 mEq total) by mouth daily.   traMADol 50 MG tablet Commonly known as:  ULTRAM Take 1 tablet (50 mg total) by mouth every 6 (six) hours as needed for moderate pain.            Discharge Care Instructions        Start     Ordered   09/24/16 0000  aspirin EC 81 MG EC tablet  Daily    Question:  Supervising Provider  Answer:  Grace Isaac   09/23/16 1131   12/45/80 9983  folic acid (FOLVITE) 1 MG tablet  Daily    Comments:  Ok to substitute  Question:  Supervising Provider  Answer:  Lanelle Bal B   09/23/16 1131   09/24/16 0000  furosemide (LASIX) 40 MG tablet  Daily    Question:  Supervising Provider  Answer:  Lanelle Bal B   09/23/16 1131   09/23/16 0000  Amb Referral to Cardiac Rehabilitation    Question Answer Comment  Diagnosis: Valve Replacement   Valve: Aortic c/ ascending aortic root replacement     09/23/16 1040   09/23/16 0000  amiodarone (PACERONE) 200 MG tablet  2 times daily after meals    Question:  Supervising Provider  Answer:  Lanelle Bal B   09/23/16 1131   09/23/16 0000  atenolol (TENORMIN) 25 MG tablet  2 times daily    Question:  Supervising Provider  Answer:  Lanelle Bal B   09/23/16 1131   09/23/16 0000  potassium chloride SA (K-DUR,KLOR-CON) 20 MEQ tablet  Daily    Question:  Supervising Provider  Answer:  Lanelle Bal B   09/23/16 1131   09/23/16 0000  traMADol (ULTRAM) 50 MG tablet   Every 6 hours PRN    Question:  Supervising Provider  Answer:  Grace Isaac   09/23/16 1334     Follow-up Information    Martinique, [name], MD Follow up.   Specialty:  Cardiology Why:  see discharge paperwork for cardiology follow-up appointment Contact information: [address] [place] [address] [phone]        Grace Isaac, MD Follow up.   Specialty:  Cardiothoracic Surgery Why:  Appointment to see the surgeon on 10/29/2016 at 12:30. Please obtain a chest x-ray [facility] imaging one half hour prior to this appointment. [facility] is located in the same office complex as Dr. Evaristo Bury information: [place]  [place] [address] [phone]         The patient has been discharged on:   1.Beta Blocker:  Yes [ y  ]                              No   [   ]                              If No, reason:  2.Ace Inhibitor/ARB: Yes [   ]                                     No  [ n   ]                                     If No, reason:bp well controlled, no CAD  3.Statin:   Yes [   ]                  No  [n   ]                  If No, reason:no cad/hyperlipidemia dx  4.Ecasa:  Yes  [ y  ]  No   [   ]                  If No, reason:   Signed: GOLD,WAYNE E 09/23/2016, 1:35 PM

## 2016-09-22 NOTE — Consult Note (Signed)
[facility] CM Primary Care Navigator  09/22/2016  Lonnie Hansen 14-Mar-1950 [id number]   Met with patient at the bedside to identify possible discharge needs.  He reports that he was admitted electively for a surgical procedure (Aortic Valve Replacement, Ascending Aortic Root Replacement) that had led to this admission. Patientendorses Dr. [name] with [facility] as his primary care provider.   [facility] [address] [facility] without difficulty.   Patient states he ismanaging his medicationsat home straight out of the containers.  Patient reports that he was driving prior to this admission/ surgery but wife will be providing transportation to hisdoctors'appointments after discharge.  Patient's wife will be theprimary caregiver at home as stated.  Anticipated discharge plan is home according to patient with home health services per therapy recommendation.  Patient and wife voiced understanding to callprimary care provider's officewhen hegets home,to schedulea post discharge follow-upwithin a week or sooner if needed. Patient letter (with PCP's contact number) was provided as a reminder.  Explained to patient regarding[facility] CM services available for health management but he denies any current needs or concerns at this time.  However, patientopted and agreed with [facility] Callsforfollow-up with recovery at home.   Referral made for [facility] at discharge.  He voiced understandingto seek referral from primary care provider to [facility] care management if deemed necessaryfor services in the future.  [facility] care management information provided for future needs that may arise.   For questions, please contact:  Dannielle Huh, BSN, RN- [facility] Primary Care Navigator  Telephone: [phone] [place]

## 2016-09-22 NOTE — Progress Notes (Addendum)
[address]       [place] [address]             [phone]      7 Days Post-Op Procedure(s) (LRB): AORTIC VALVE REPLACEMENT (AVR) (N/A) ASCENDING AORTIC ROOT REPLACEMENT (N/A) TRANSESOPHAGEAL ECHOCARDIOGRAM (TEE) (N/A) Subjective: Feeling better, ambulation improving, less cough/SOB  Objective: Vital signs in last 24 hours: Temp:  [98.7 F (37.1 C)-99 F (37.2 C)] 99 F (37.2 C) (08/28 0351) Pulse Rate:  [72-85] 72 (08/28 0351) Cardiac Rhythm: Normal sinus rhythm (08/28 0500) Resp:  [15-28] 18 (08/28 0351) BP: (118-129)/(70-91) 121/70 (08/28 0351) SpO2:  [92 %-95 %] 94 % (08/28 0351) Weight:  [211 lb 6.4 oz (95.9 kg)] 211 lb 6.4 oz (95.9 kg) (08/28 0351)  Hemodynamic parameters for last 24 hours:    Intake/Output from previous day: 08/27 0701 - 08/28 0700 In: 716 [P.O.:716] Out: 1000 [Urine:1000] Intake/Output this shift: No intake/output data recorded.  General appearance: alert, cooperative and no distress Heart: regular rate and rhythm Lungs: dim in bases Abdomen: benign Extremities: trace edema Wound: incis healing well  Lab Results:  Recent Labs  09/20/16 0344 09/21/16 0249  WBC 11.2* 11.1*  HGB 8.7* 8.5*  HCT 26.8* 26.2*  PLT 66* 74*   BMET:  Recent Labs  09/20/16 0344 09/21/16 0249  NA 136 135  K 3.0* 3.8  CL 99* 99*  CO2 28 26  GLUCOSE 126* 110*  BUN 23* 16  CREATININE 0.89 0.94  CALCIUM 8.2* 8.1*    PT/INR: No results for input(s): LABPROT, INR in the last 72 hours. ABG    Component Value Date/Time   PHART 7.483 (H) 09/17/2016 1452   HCO3 26.6 09/17/2016 1452   TCO2 28 09/17/2016 1452   ACIDBASEDEF 1.0 09/16/2016 1048   O2SAT 59.9 09/20/2016 0355   CBG (last 3)   Recent Labs  09/20/16 2116 09/21/16 0634 09/21/16 1136  GLUCAP 133* 105* 116*    Meds Scheduled Meds: . amiodarone  200 mg Oral BID PC  . amLODipine  10 mg Oral Daily  . aspirin EC  81 mg Oral Daily  . atenolol  12.5 mg Oral BID    . bisacodyl  10 mg Oral Daily   Or  . bisacodyl  10 mg Rectal Daily  . Chlorhexidine Gluconate Cloth  6 each Topical Daily  . docusate sodium  200 mg Oral Daily  . feeding supplement (ENSURE ENLIVE)  237 mL Oral TID PC  . folic acid  1 mg Oral Daily  . furosemide  40 mg Oral Daily  . latanoprost  1 drop Both Eyes QHS  . pantoprazole  40 mg Oral Daily  . potassium chloride  20 mEq Oral BID  . sodium chloride flush  10-40 mL Intracatheter Q12H  . sodium chloride flush  3 mL Intravenous Q12H  . sodium chloride flush  3 mL Intravenous Q12H   Continuous Infusions: . sodium chloride     PRN Meds:.sodium chloride, metoprolol tartrate, morphine injection, ondansetron (ZOFRAN) IV, oxyCODONE, sodium chloride flush, sodium chloride flush, sodium chloride flush, traMADol  Xrays Dg Chest 2 View  Result Date: 09/21/2016 CLINICAL DATA:  Atelectasis. EXAM: CHEST  2 VIEW COMPARISON:  09/19/2016. FINDINGS: Right PICC line in stable position. Prior cardiac valve replacement. Stable cardiomegaly. Slight improvement of basilar atelectasis/infiltrates . Bilateral pleural effusions, improved from prior exam. No pneumothorax . IMPRESSION: 1. Right PICC line in stable position. 2.  Prior cardiac valve replacement.  Stable cardiomegaly.  3. Slight improvement of basilar atelectasis/ infiltrates. Bilateral pleural effusions improved. No pneumothorax . Electronically Signed   By: Marcello Moores  Register   On: 09/21/2016 06:46    Assessment/Plan: S/P Procedure(s) (LRB): AORTIC VALVE REPLACEMENT (AVR) (N/A) ASCENDING AORTIC ROOT REPLACEMENT (N/A) TRANSESOPHAGEAL ECHOCARDIOGRAM (TEE) (N/A)  1 conts to progress nicely 2 sinus rhythm, bp control is good on current Rx 3 no new labs 4 BS control is good 5 cont q day lasix for now, d/c soon 6 D/C epw's  7 poss d/c in am  LOS: 7 days    GOLD,WAYNE E 09/22/2016   Chart reviewed, patient examined, agree with above. Weight is at baseline. He is ambulating well.  Bowels working. Plan home tomorrow.

## 2016-09-23 MED ORDER — FOLIC ACID 1 MG PO TABS
1.0000 mg | ORAL_TABLET | Freq: Every day | ORAL | 1 refills | Status: DC
Start: 2016-09-24 — End: 2016-11-10

## 2016-09-23 MED ORDER — ASPIRIN 81 MG PO TBEC: 81.0000 mg | DELAYED_RELEASE_TABLET | Freq: Every day | ORAL | Status: DC

## 2016-09-23 MED ORDER — OXYCODONE HCL 5 MG PO TABS: 5.0000 mg | ORAL_TABLET | Freq: Four times a day (QID) | ORAL | 0 refills | Status: DC | PRN

## 2016-09-23 MED ORDER — ATENOLOL 25 MG PO TABS: 12.5000 mg | ORAL_TABLET | Freq: Two times a day (BID) | ORAL | 1 refills | Status: DC

## 2016-09-23 MED ORDER — TRAMADOL HCL 50 MG PO TABS: 50.0000 mg | ORAL_TABLET | Freq: Four times a day (QID) | ORAL | 0 refills | Status: DC | PRN

## 2016-09-23 MED ORDER — FUROSEMIDE 40 MG PO TABS: 40.0000 mg | ORAL_TABLET | Freq: Every day | ORAL | 0 refills | Status: DC

## 2016-09-23 MED ORDER — POTASSIUM CHLORIDE CRYS ER 20 MEQ PO TBCR: 20.0000 meq | EXTENDED_RELEASE_TABLET | Freq: Every day | ORAL | 0 refills | Status: DC

## 2016-09-23 MED ORDER — AMIODARONE HCL 200 MG PO TABS: 200.0000 mg | ORAL_TABLET | Freq: Two times a day (BID) | ORAL | 1 refills | Status: DC

## 2016-09-23 NOTE — Progress Notes (Signed)
CARDIAC REHAB PHASE I   Pt hopeful for discharge today, declines ambulation at this time. Cardiac surgery discharge education completed with pt and wife at bedside. Reviewed IS, sternal precautions, activity progression, exercise,  heart healthy diet, daily weights and phase 2 cardiac rehab. Pt and verbalized understanding. Pt agrees to phase 2 cardiac rehab referral, will send to [facility]. Pt in bed, call bell within reach. Will follow if pt does not discharge today.  [id number] Lenna Sciara, RN, BSN 09/23/2016 10:44 AM

## 2016-09-23 NOTE — Progress Notes (Signed)
Tramadol 50mg  po pulled for pt to take for pain prior to discharge, but pt refused.  I forgot to return before pt was removed from pyxis.  Hand delivered to [facility] in pharmacy as per his instructions. Angelia Mould, RN along for witness.

## 2016-09-23 NOTE — Care Management Note (Signed)
Case Management Note Marvetta Gibbons RN, BSN Unit 4E-Case Manager-- [place] coverage [phone]  Patient Details  Name: Lonnie Hansen MRN: [id number] Date of Birth: [birth year]  Subjective/Objective:  Pt admitted s/p AORTIC VALVE REPLACEMENT (AVR)  ASCENDING AORTIC ROOT REPLACEMENT -- 0n 09/15/16                   Action/Plan: PTA pt lived at home with wife- CM to follow for d/c needs  Expected Discharge Date:  09/23/16               Expected Discharge Plan:  Home/Self Care  In-House Referral:  NA  Discharge planning Services  CM Consult  Post Acute Care Choice:    Choice offered to:  Patient, Spouse  DME Arranged:  N/A DME Agency:  NA  HH Arranged:  NA HH Agency:  NA  Status of Service:  Completed, signed off  If discussed at [place] of Stay Meetings, dates discussed:    Discharge Disposition: home/self care   Additional Comments:  09/23/16- 1300- [place], CM- pt for d/c home today with wife- no CM needs noted for discharge.   Dawayne Patricia, RN 09/23/2016, 2:44 PM

## 2016-09-23 NOTE — Evaluation (Signed)
Clinical/Bedside Swallow Evaluation Patient Details  Name: Lonnie Hansen MRN: [id number] Date of Birth: [birth year]  Today's Date: 09/23/2016 Time: SLP Start Time (ACUTE ONLY): 0845 SLP Stop Time (ACUTE ONLY): 0930 SLP Time Calculation (min) (ACUTE ONLY): 45 min  Past Medical History:  Past Medical History:  Diagnosis Date  . Anemia   . Arthritis   . Asymptomatic bilateral carotid artery stenosis 08/2015   1-39%   . Blood dyscrasia    "trouble with my bloods clotting"  . Bruising    on the skin states due to platelets or sometimes low or high  . Cataracts, bilateral   . Coronary artery disease   . Diverticulosis   . Enlarged aorta ([place])   . Enlarged prostate    slightly  . GERD (gastroesophageal reflux disease)    takes Pantoprazole daily as needed  . Glaucoma    uses eye drops daily  . Headache   . History of colon polyps    benign  . History of kidney stones   . Hyperlipidemia    no on any meds  . Hypertension    takes Amlodipine and Atenolol daily  . Joint pain   . Joint swelling   . Nocturia   . Sleep apnea   . Vocal cord nodule    pt. states  it's a" growth on vocal cord"   Past Surgical History:  Past Surgical History:  Procedure Laterality Date  . AORTIC ARCH ANGIOGRAPHY N/A 04/16/2016   Procedure: Aortic Arch Angiography;  Surgeon: [name] Martinique, MD;  Location: [place] [facility];  Service: Cardiovascular;  Laterality: N/A;  . AORTIC VALVE REPLACEMENT N/A 09/15/2016   Procedure: AORTIC VALVE REPLACEMENT (AVR);  Surgeon: Grace Isaac, MD;  Location: [place];  Service: Open Heart Surgery;  Laterality: N/A;  Using 44mm Edwards Perimount Magna Ease Aortic Bioprosthesis Valve  . ASCENDING AORTIC ROOT REPLACEMENT N/A 09/15/2016   Procedure: ASCENDING AORTIC ROOT REPLACEMENT;  Surgeon: Grace Isaac, MD;  Location: [place];  Service: Open Heart Surgery;  Laterality: N/A;  Using 64mm Gelweave Valsalva Graft  . COLONOSCOPY    . COLONOSCOPY WITH  ESOPHAGOGASTRODUODENOSCOPY (EGD)    . MULTIPLE EXTRACTIONS WITH ALVEOLOPLASTY N/A 06/10/2016   Procedure: Extraction of tooth #'s 2,8,13,15, and 29  with alveoloplasty, maxillary right and left buccal exostoses reductions, and gross debridement of remaining teeth.;  Surgeon: Lenn Cal, DDS;  Location: [place];  Service: Oral Surgery;  Laterality: N/A;  . RIGHT/LEFT HEART CATH AND CORONARY ANGIOGRAPHY N/A 04/16/2016   Procedure: Right/Left Heart Cath and Coronary Angiography;  Surgeon: [name] Martinique, MD;  Location: [place] [facility];  Service: Cardiovascular;  Laterality: N/A;  . TEE WITHOUT CARDIOVERSION N/A 09/15/2016   Procedure: TRANSESOPHAGEAL ECHOCARDIOGRAM (TEE);  Surgeon: Grace Isaac, MD;  Location: [place];  Service: Open Heart Surgery;  Laterality: N/A;   HPI:  The patient is a 66 year old male who first presented to Cardiac Surgery at the time of his preop evaluation for hip replacement. At that time, he was noted to have a dilated aorta. This led to further evaluation including echocardiogram and ultimately cardiac catheterization confirming that the patient had severe aortic insufficiency with a dilated aortic root and proximal ascending aorta. On 09/15/16 pt underwent AORTIC VALVE REPLACEMENT, ASCENDING AORTIC ROOT REPLACEMENT. Extubated 8/23. On 8/28 pt reported difficulty swallowing to PA. 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    Assessment /  Plan / Recommendation Clinical Impression  Pt demonstrates ability to masticate and swallow solid and liquid consistencies with no signs of aspiration. However, with solid foods pt demonstrated prolonged mastication and lack of initiation in bolus formation as well as A-P transit without max verbal cueing or liquid wash. Pt was aware of the problem but reported not being able to grind food well enough and continued loading his mouth with more bites. Given adequate ability to masticate  and swallow, suspect deficits related to dry mouth, sensory/taste changes following surgery and pain medication. Offered compensatory strategies and suggested selecting palatable, moist foods from home if needed. Pt and wife verbalized understanding. Will f/u for progress and to reinforce strategies. Expect resolution with ongoing recovery.  SLP Visit Diagnosis: Dysphagia, unspecified (R13.10)    Aspiration Risk       Diet Recommendation Thin liquid;Regular   Liquid Administration via: Cup;Straw Medication Administration: Whole meds with liquid Supervision: Patient able to self feed Compensations: Small sips/bites;Follow solids with liquid    Other  Recommendations Oral Care Recommendations: Oral care BID   Follow up Recommendations None      Frequency and Duration min 1 x/week  2 weeks       Prognosis Prognosis for Safe Diet Advancement: Good Barriers to Reach Goals: Medication      Swallow Study   General HPI: The patient is a 66 year old male who first presented to Cardiac Surgery at the time of his preop evaluation for hip replacement. At that time, he was noted to have a dilated aorta. This led to further evaluation including echocardiogram and ultimately cardiac catheterization confirming that the patient had severe aortic insufficiency with a dilated aortic root and proximal ascending aorta. On 09/15/16 pt underwent AORTIC VALVE REPLACEMENT, ASCENDING AORTIC ROOT REPLACEMENT. Extubated 8/23. On 8/28 pt reported difficulty swallowing to PA. 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  Type of Study: Bedside Swallow Evaluation Diet Prior to this Study: Regular;Thin liquids Temperature Spikes Noted: No Respiratory Status: Room air History of Recent Intubation: Yes Length of Intubations (days): 3 days Date extubated: 09/17/16 Behavior/Cognition: Alert;Cooperative;Pleasant mood Oral Cavity Assessment: Within Functional  Limits Oral Care Completed by SLP: Other (Comment) (Pt brushed teeth independently) Oral Cavity - Dentition: Adequate natural dentition;Missing dentition Vision: Functional for self-feeding Self-Feeding Abilities: Able to feed self Patient Positioning: Upright in bed Baseline Vocal Quality: Hoarse    Oral/Motor/Sensory Function Overall Oral Motor/Sensory Function: Within functional limits   Ice Chips     Thin Liquid Thin Liquid: Impaired Presentation: Cup;Self Fed;Straw Pharyngeal  Phase Impairments: Cough - Immediate    Nectar Thick Nectar Thick Liquid: Not tested   Honey Thick Honey Thick Liquid: Not tested   Puree Puree: Not tested   Solid   GO   Solid: Impaired Presentation: Self Fed Oral Phase Functional Implications: Right lateral sulci pocketing;Left lateral sulci pocketing;Prolonged oral transit Pharyngeal Phase Impairments: Throat Clearing - Delayed        Aaron Edelman, Student SLP 09/23/2016,10:07 AM

## 2016-09-23 NOTE — Progress Notes (Signed)
      [address]       [place] [address]             [phone]      8 Days Post-Op Procedure(s) (LRB): AORTIC VALVE REPLACEMENT (AVR) (N/A) ASCENDING AORTIC ROOT REPLACEMENT (N/A) TRANSESOPHAGEAL ECHOCARDIOGRAM (TEE) (N/A) Subjective: Feels well overall, strength conts to improve. Says he's having trouble swallowing now  Objective: Vital signs in last 24 hours: Temp:  [99 F (37.2 C)-99.5 F (37.5 C)] 99.5 F (37.5 C) (08/29 0400) Pulse Rate:  [72-96] 73 (08/29 0400) Cardiac Rhythm: Normal sinus rhythm (08/28 1945) Resp:  [17-31] 17 (08/29 0400) BP: (116-134)/(77-89) 116/87 (08/29 0400) SpO2:  [93 %-97 %] 95 % (08/29 0400) Weight:  [210 lb 6.4 oz (95.4 kg)] 210 lb 6.4 oz (95.4 kg) (08/29 0400)  Hemodynamic parameters for last 24 hours:    Intake/Output from previous day: 08/28 0701 - 08/29 0700 In: 360 [P.O.:360] Out: 1400 [Urine:1400] Intake/Output this shift: No intake/output data recorded.  General appearance: alert, cooperative and no distress Heart: regular rate and rhythm Lungs: mildly dim in left base Abdomen: benign Extremities: no edema Wound: incis healing well  Lab Results:  Recent Labs  09/21/16 0249  WBC 11.1*  HGB 8.5*  HCT 26.2*  PLT 74*   BMET:  Recent Labs  09/21/16 0249  NA 135  K 3.8  CL 99*  CO2 26  GLUCOSE 110*  BUN 16  CREATININE 0.94  CALCIUM 8.1*    PT/INR: No results for input(s): LABPROT, INR in the last 72 hours. ABG    Component Value Date/Time   PHART 7.483 (H) 09/17/2016 1452   HCO3 26.6 09/17/2016 1452   TCO2 28 09/17/2016 1452   ACIDBASEDEF 1.0 09/16/2016 1048   O2SAT 59.9 09/20/2016 0355   CBG (last 3)   Recent Labs  09/20/16 2116 09/21/16 0634 09/21/16 1136  GLUCAP 133* 105* 116*    Meds Scheduled Meds: . amiodarone  200 mg Oral BID PC  . amLODipine  10 mg Oral Daily  . aspirin EC  81 mg Oral Daily  . atenolol  12.5 mg Oral BID  . bisacodyl  10 mg Oral Daily   Or  .  bisacodyl  10 mg Rectal Daily  . Chlorhexidine Gluconate Cloth  6 each Topical Daily  . docusate sodium  200 mg Oral Daily  . feeding supplement (ENSURE ENLIVE)  237 mL Oral TID PC  . folic acid  1 mg Oral Daily  . furosemide  40 mg Oral Daily  . latanoprost  1 drop Both Eyes QHS  . pantoprazole  40 mg Oral Daily  . pneumococcal 23 valent vaccine  0.5 mL Intramuscular Tomorrow-1000  . potassium chloride  20 mEq Oral BID  . sodium chloride flush  10-40 mL Intracatheter Q12H  . sodium chloride flush  3 mL Intravenous Q12H  . sodium chloride flush  3 mL Intravenous Q12H   Continuous Infusions: . sodium chloride     PRN Meds:.sodium chloride, metoprolol tartrate, morphine injection, ondansetron (ZOFRAN) IV, oxyCODONE, sodium chloride flush, sodium chloride flush, sodium chloride flush, traMADol  Xrays No results found.  Assessment/Plan: S/P Procedure(s) (LRB): AORTIC VALVE REPLACEMENT (AVR) (N/A) ASCENDING AORTIC ROOT REPLACEMENT (N/A) TRANSESOPHAGEAL ECHOCARDIOGRAM (TEE) (N/A)   1 conts to do well 2  With new c/o swallow trouble we will ask speech therapy to eval prior to discharge  LOS: 8 days    Petrita Blunck E 09/23/2016

## 2016-09-25 ENCOUNTER — Telehealth (HOSPITAL_COMMUNITY): Payer: Self-pay

## 2016-09-25 NOTE — Telephone Encounter (Signed)
Patient insurances is active and benefits verified. Patient insurances are Medicare A/B and [employer] Medicare Supplement.  Medicare A/B - no co-payment, deductible $183.00/$183.00 has been met, 20% co-insurance, no out of pocket and no pre-authorization. Passport/reference # [id number]. [employer] Medicare Supplement - no co-payment, no deductible, no out of pocket, no co-insurance and no pre-authorization. [employer] - Fax on 09/25/16 @ 3:05pm -reference #[id number].  Patient will be contacted and scheduled after their follow up appointment with the cardiologist on 10/08/16 and surgeon on 10/29/16, upon review by [facility] RN navigator.

## 2016-09-29 ENCOUNTER — Other Ambulatory Visit: Payer: Self-pay

## 2016-09-29 NOTE — Patient Outreach (Addendum)
[facility]) Care Management  09/29/2016  MERWIN BREDEN December 25, 1950 [id number]   Telephone Screen  Referral Date: 09/25/16 Referral Source: Self Referral Referral Reason: "patient called back after receiving call(possibly EMMI Call), has some questions, recently discharged on 09/23/16" RED on EMMI-GENERAL DISCHARGE Alert Red Alert Date: 09/25/16 Red Alert Reason: "Questions about discharge papers? Yes  Know who to call about changes in condition? No    Other questions/problems? Yes" Insurance: [place] attempt # 1 to patient. No answer. RN CM left HIPAA compliant voicemail message along with contact info.      Plan: RN CM will make outreach attempt to patient within three business days if no return call from patient.   Enzo Montgomery, RN,BSN,CCM [place] Management Telephonic Care Management Coordinator Direct Phone: [phone] Toll Free: [phone] Fax: [phone]

## 2016-09-30 ENCOUNTER — Other Ambulatory Visit: Payer: Self-pay

## 2016-09-30 NOTE — Patient Outreach (Signed)
[facility]) Care Management  09/30/2016  CHILD CAMPOY 09/25/1950 [id number]    Telephone Screen  Referral Date: 09/25/16 Referral Source: Self Referral Referral Reason: "patient called back after receiving call(possibly EMMI Call), has some questions, recently discharged on 09/23/16" RED on EMMI-GENERAL DISCHARGE Alert Red Alert Date: 09/25/16 Red Alert Reason: "Questions about discharge papers? Yes  Know who to call about changes in condition? No    Other questions/problems? Yes" Insurance: [place] attempt #2 to patient. RN CM spoke with patient. patient immediately staring fussing that "we did not set up his rehab." Advised patient of [facility] services and that this was not something we were supposed to do. Per chart review informed patient that he needed to contact cardiac rehab dept to f/u on rehab. He did not have a pen and paper nearby to write info down and states he has to "find" his d/c paperwork. He agreed to RN CM calling him back and leaving info on his voicemail. Patient states that he called PCP office to inquires about rehab as well but was told they could not assist. RN CM reviewed with patient upcoming f/u appts and patient confirmed he has transportation to appts. He did not make PCP f/u appt and RN CM educated patient on the importance of this. He will call office back and make f/u appt. Patient voices that he is concerned as he is having decreased appetite. He also voices that prior to admission he weighed 200 lbs, while in the hospital his weight went up to 212 lbs due to volume of IV fluids given to him. He voices that he got on the scale yesterday and he weighed 185 lbs. Patient did not verify to ensure scale is properly working or rechecked his weight. Encouraged patient to do this. Advised him to contact MD office to discuss concerns with decreased appetite and possible wgt loss. He voiced understanding. No further RN CM needs or  concerns at this time.   Plan: RN CM will leave voicemail message for patient per his request with appts and contact info. RN CM will notify [facility] of case status.  Enzo Montgomery, RN,BSN,CCM [place] Management Telephonic Care Management Coordinator Direct Phone: [phone] Toll Free: [phone] Fax: [phone]

## 2016-10-01 ENCOUNTER — Other Ambulatory Visit: Payer: Self-pay | Admitting: *Deleted

## 2016-10-01 ENCOUNTER — Other Ambulatory Visit (HOSPITAL_COMMUNITY): Payer: Self-pay | Admitting: Cardiothoracic Surgery

## 2016-10-01 DIAGNOSIS — R1310 Dysphagia, unspecified: Secondary | ICD-10-CM

## 2016-10-01 DIAGNOSIS — R1319 Other dysphagia: Secondary | ICD-10-CM

## 2016-10-02 ENCOUNTER — Other Ambulatory Visit: Payer: Self-pay | Admitting: *Deleted

## 2016-10-02 ENCOUNTER — Ambulatory Visit: Payer: Self-pay

## 2016-10-02 DIAGNOSIS — D508 Other iron deficiency anemias: Secondary | ICD-10-CM

## 2016-10-02 MED ORDER — IRON 325 (65 FE) MG PO TABS: 1.0000 | ORAL_TABLET | Freq: Two times a day (BID) | ORAL | 1 refills | Status: DC

## 2016-10-05 ENCOUNTER — Ambulatory Visit (HOSPITAL_COMMUNITY)
Admission: RE | Admit: 2016-10-05 | Discharge: 2016-10-05 | Disposition: A | Discharge status: Home / Self Care | Payer: Medicare Other | Source: Ambulatory Visit | Attending: Cardiothoracic Surgery | Admitting: Cardiothoracic Surgery

## 2016-10-05 DIAGNOSIS — R1310 Dysphagia, unspecified: Secondary | ICD-10-CM | POA: Diagnosis not present

## 2016-10-05 DIAGNOSIS — R1319 Other dysphagia: Secondary | ICD-10-CM

## 2016-10-05 DIAGNOSIS — R634 Abnormal weight loss: Secondary | ICD-10-CM | POA: Diagnosis not present

## 2016-10-06 ENCOUNTER — Ambulatory Visit
Admission: RE | Admit: 2016-10-06 | Discharge: 2016-10-06 | Disposition: A | Discharge status: Home / Self Care | Payer: Medicare Other | Source: Ambulatory Visit | Attending: Physician Assistant | Admitting: Physician Assistant

## 2016-10-06 ENCOUNTER — Encounter: Payer: Self-pay | Admitting: Physician Assistant

## 2016-10-06 ENCOUNTER — Telehealth: Payer: Self-pay | Admitting: *Deleted

## 2016-10-06 ENCOUNTER — Ambulatory Visit (INDEPENDENT_AMBULATORY_CARE_PROVIDER_SITE_OTHER): Payer: Medicare Other | Admitting: Physician Assistant

## 2016-10-06 VITALS — BP 111/76 | HR 78 | Ht 69.0 in | Wt 198.6 lb

## 2016-10-06 DIAGNOSIS — I480 Paroxysmal atrial fibrillation: Secondary | ICD-10-CM | POA: Diagnosis not present

## 2016-10-06 DIAGNOSIS — D696 Thrombocytopenia, unspecified: Secondary | ICD-10-CM | POA: Diagnosis not present

## 2016-10-06 DIAGNOSIS — I313 Pericardial effusion (noninflammatory): Secondary | ICD-10-CM | POA: Diagnosis not present

## 2016-10-06 DIAGNOSIS — Z9889 Other specified postprocedural states: Secondary | ICD-10-CM

## 2016-10-06 DIAGNOSIS — I10 Essential (primary) hypertension: Secondary | ICD-10-CM

## 2016-10-06 DIAGNOSIS — J90 Pleural effusion, not elsewhere classified: Secondary | ICD-10-CM

## 2016-10-06 DIAGNOSIS — J181 Lobar pneumonia, unspecified organism: Secondary | ICD-10-CM | POA: Diagnosis not present

## 2016-10-06 DIAGNOSIS — D649 Anemia, unspecified: Secondary | ICD-10-CM | POA: Diagnosis not present

## 2016-10-06 DIAGNOSIS — I2510 Atherosclerotic heart disease of native coronary artery without angina pectoris: Secondary | ICD-10-CM | POA: Diagnosis not present

## 2016-10-06 DIAGNOSIS — I3139 Other pericardial effusion (noninflammatory): Secondary | ICD-10-CM

## 2016-10-06 DIAGNOSIS — E785 Hyperlipidemia, unspecified: Secondary | ICD-10-CM

## 2016-10-06 DIAGNOSIS — Z79899 Other long term (current) drug therapy: Secondary | ICD-10-CM

## 2016-10-06 DIAGNOSIS — Z952 Presence of prosthetic heart valve: Secondary | ICD-10-CM | POA: Diagnosis not present

## 2016-10-06 MED ORDER — FUROSEMIDE 20 MG PO TABS: 20.0000 mg | ORAL_TABLET | Freq: Every day | ORAL | 3 refills | Status: DC

## 2016-10-06 MED ORDER — ATORVASTATIN CALCIUM 40 MG PO TABS: 40.0000 mg | ORAL_TABLET | Freq: Every day | ORAL | 3 refills | Status: DC

## 2016-10-06 NOTE — Progress Notes (Signed)
Cardiology Office Note    Date:  10/07/2016   ID:  Lonnie Hansen, DOB 03-Jun-1950, MRN [id number]  PCP:  [name], MD  Cardiologist:  Dr. Martinique   Chief Complaint  Patient presents with  . Hospitalization Follow-up    seen for Dr. Martinique.    History of Present Illness:  Lonnie Hansen is a 66 y.o. 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 He had a syncopal spell in 2012, he was ruled out for MI, lab was significant for hypokalemia, follow-up stress test as outpatient was reportedly to be okay. I am unable to locate the previous stress test report. More recently, chest x-ray showed aortic root enlargement. He underwent CT that showed dilatation of proximal aorta. Descending aorta was normal. Echocardiogram obtained on 09/20/2015 showed EF 60-65%, moderate to severe AI, grade 1 DD. He was evaluated by Dr. Servando Snare. More recently, CT scan showed aortic root is enlarged to 5.2 cm. Given the size of aortic root and the degree of AI, it was recommended he undergo aortic root grafting and AVR. He underwent preoperative evaluation with cardiac catheterization on 04/14/2016 which showed moderate to severe AI, EF 45%, 80% stenosis in RCA, 50% distal LAD. He eventually underwent the planned procedure with ascending aortic root replacement using 32 mm Gelweave Valsalva Graft and AVR using 29 mm Edwards Perimount Magna Ease aortic bioprosthesis valve on 09/15/2016 by Dr. Servando Snare. Echo obtained on the following day on 09/14/2016 showed EF 55-60%, severe LVH, severely calcified mitral annulus without significant stenosis, moderate pericardial effusion posterior to the left atrium. Postoperatively, he did have some volume overload that responded well to diuretic. He also had postoperative thrombocytopenia as well. He was placed on oral amiodarone for postoperative atrial fibrillation.  Patient presents today for cartilage office  visit. He was discharged on a short course of Lasix which had healed has completed for now. I will obtain basic metabolic panel today. On physical exam, it was also noticed he has markedly diminished breath sound in the right base of the lung. I suspect there is recurrent pleural effusion, will obtain two-view chest x-ray. He continued to have significant weakness and shortness of breath. I plan to obtain CBC as he has postop anemia and thrombocytopenia from valvular procedure recently. He will also need a limited echocardiogram to make sure he is pericardial effusion is not getting larger. As far as the risk factors for coronary artery disease, previous carotid catheterization in March 2018 did show several blockages, he is under medical therapy for 80% small RCA lesion and 50% distal LAD lesion. I recommended Lipitor 40 mg daily. He will return tomorrow morning for fasting lipid panel. He can follow-up with Dr. Martinique in 2 months.   I will clarify with Dr. Martinique whether he wished the patient to start on cardiac rehabilitation versus physical therapy. I think some of his symptom is related to severe deconditioning from the recent surgery. However he says he has not received any worse on either cardiac rehabilitation will physical therapy at this time.    Past Medical History:  Diagnosis Date  . Anemia   . Arthritis   . Asymptomatic bilateral carotid artery stenosis 08/2015   1-39%   . Blood dyscrasia    "trouble with my bloods clotting"  . Bruising    on the skin states due to platelets or sometimes low or high  . Cataracts, bilateral   .  Coronary artery disease   . Diverticulosis   . Enlarged aorta ([place])   . Enlarged prostate    slightly  . GERD (gastroesophageal reflux disease)    takes Pantoprazole daily as needed  . Glaucoma    uses eye drops daily  . Headache   . History of colon polyps    benign  . History of kidney stones   . Hyperlipidemia    no on any meds  . Hypertension      takes Amlodipine and Atenolol daily  . Joint pain   . Joint swelling   . Nocturia   . Sleep apnea   . Vocal cord nodule    pt. states  it's a" growth on vocal cord"    Past Surgical History:  Procedure Laterality Date  . AORTIC ARCH ANGIOGRAPHY N/A 04/16/2016   Procedure: Aortic Arch Angiography;  Surgeon: [name] Martinique, MD;  Location: [place] [facility];  Service: Cardiovascular;  Laterality: N/A;  . AORTIC VALVE REPLACEMENT N/A 09/15/2016   Procedure: AORTIC VALVE REPLACEMENT (AVR);  Surgeon: Grace Isaac, MD;  Location: [place];  Service: Open Heart Surgery;  Laterality: N/A;  Using 17mm Edwards Perimount Magna Ease Aortic Bioprosthesis Valve  . ASCENDING AORTIC ROOT REPLACEMENT N/A 09/15/2016   Procedure: ASCENDING AORTIC ROOT REPLACEMENT;  Surgeon: Grace Isaac, MD;  Location: [place];  Service: Open Heart Surgery;  Laterality: N/A;  Using 78mm Gelweave Valsalva Graft  . COLONOSCOPY    . COLONOSCOPY WITH ESOPHAGOGASTRODUODENOSCOPY (EGD)    . MULTIPLE EXTRACTIONS WITH ALVEOLOPLASTY N/A 06/10/2016   Procedure: Extraction of tooth #'s 2,8,13,15, and 29  with alveoloplasty, maxillary right and left buccal exostoses reductions, and gross debridement of remaining teeth.;  Surgeon: Lenn Cal, DDS;  Location: [place];  Service: Oral Surgery;  Laterality: N/A;  . RIGHT/LEFT HEART CATH AND CORONARY ANGIOGRAPHY N/A 04/16/2016   Procedure: Right/Left Heart Cath and Coronary Angiography;  Surgeon: [name] Martinique, MD;  Location: [place] [facility];  Service: Cardiovascular;  Laterality: N/A;  . TEE WITHOUT CARDIOVERSION N/A 09/15/2016   Procedure: TRANSESOPHAGEAL ECHOCARDIOGRAM (TEE);  Surgeon: Grace Isaac, MD;  Location: [place];  Service: Open Heart Surgery;  Laterality: N/A;    Current Medications: Outpatient Medications Prior to Visit  Medication Sig Dispense Refill  . amiodarone (PACERONE) 200 MG tablet Take 1 tablet (200 mg total) by mouth 2 (two) times daily after a  meal. 60 tablet 1  . amLODipine (NORVASC) 10 MG tablet Take 10 mg by mouth daily.    Marland Kitchen aspirin EC 81 MG EC tablet Take 1 tablet (81 mg total) by mouth daily.    Marland Kitchen atenolol (TENORMIN) 25 MG tablet Take 0.5 tablets (12.5 mg total) by mouth 2 (two) times daily. 30 tablet 1  . Ferrous Sulfate (IRON) 325 (65 Fe) MG TABS Take 1 tablet (325 mg total) by mouth 2 (two) times daily with a meal. 60 each 1  . folic acid (FOLVITE) 1 MG tablet Take 1 tablet (1 mg total) by mouth daily. 30 tablet 1  . latanoprost (XALATAN) 0.005 % ophthalmic solution Place 1 drop into both eyes at bedtime.    . Multiple Vitamin (MULTIVITAMIN WITH MINERALS) TABS tablet Take 1 tablet by mouth daily.    . pantoprazole (PROTONIX) 40 MG tablet Take 20 mg by mouth daily as needed (indigestion).     . traMADol (ULTRAM) 50 MG tablet Take 1 tablet (50 mg total) by mouth every 6 (six) hours as needed  for moderate pain. 30 tablet 0  . furosemide (LASIX) 40 MG tablet Take 1 tablet (40 mg total) by mouth daily. 7 tablet 0  . potassium chloride SA (K-DUR,KLOR-CON) 20 MEQ tablet Take 1 tablet (20 mEq total) by mouth daily. 7 tablet 0   No facility-administered medications prior to visit.      Allergies:   No known allergies   Social History   Social History  . Marital status: Married    Spouse name: N/A  . Number of children: 1  . Years of education: N/A   Occupational History  . Musician    Social History Main Topics  . Smoking status: Former Smoker    Packs/day: 1.00    Years: 25.00    Types: Cigarettes  . Smokeless tobacco: Never Used  . Alcohol use No  . Drug use: No  . Sexual activity: Not Asked   Other Topics Concern  . None   Social History Narrative   Married.   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     Family History:  The patient's family history includes Heart attack in his father; Heart disease in his father; Thyroid disease in his sister.   ROS:   Please see the history of present illness.     ROS All other systems reviewed and are negative.   PHYSICAL EXAM:   VS:  BP 111/76   Pulse 78   Ht 5\' 9"  (1.753 m)   Wt 198 lb 9.6 oz (90.1 kg)   SpO2 93%   BMI 29.33 kg/m    GEN: Well nourished, well developed, in no acute distress  HEENT: normal  Neck: no JVD, carotid bruits, or masses Cardiac: RRR; no murmurs, rubs, or gallops,no edema  Respiratory: Markedly diminished breath sounds in the right base GI: soft, nontender, nondistended, + BS MS: no deformity or atrophy  Skin: warm and dry, no rash Neuro:  Alert and Oriented x 3, Strength and sensation are intact Psych: euthymic mood, full affect  Wt Readings from Last 3 Encounters:  10/06/16 198 lb 9.6 oz (90.1 kg)  09/23/16 210 lb 6.4 oz (95.4 kg)  09/11/16 204 lb 3.2 oz (92.6 kg)      Studies/Labs Reviewed:   EKG:  EKG is ordered today.  The ekg ordered today demonstrates Normal sinus rhythm T wave inversion in V3 through V6.  Recent Labs: 09/16/2016: Magnesium 2.2 09/19/2016: ALT 19 10/07/2016: BUN 11; Creatinine, Ser 0.84; Hemoglobin 10.4; Platelets 202; Potassium 4.2; Sodium 133   Lipid Panel    Component Value Date/Time   CHOL 135 10/07/2016 0812   TRIG 75 10/07/2016 0812   HDL 34 (L) 10/07/2016 0812   CHOLHDL 4.0 10/07/2016 0812   CHOLHDL 3.5 04/28/2010 0703   VLDL 14 04/28/2010 0703   LDLCALC 86 10/07/2016 0812    Additional studies/ records that were reviewed today include:   Echo 09/20/2015 LV EF: 60% -   65%  Study Conclusions  - Left ventricle: The cavity size was normal. There was moderate   focal basal hypertrophy of the septum. Systolic function was   normal. The estimated ejection fraction was in the range of 60%   to 65%. Wall motion was normal; there were no regional wall   motion abnormalities. Doppler parameters are consistent with   abnormal left ventricular relaxation (grade 1 diastolic   dysfunction). - Aortic valve: There was moderate to severe regurgitation. - Aorta: Aortic  root dimension: 43 mm (ED). -  Ascending aorta: The ascending aorta was mildly dilated. - Right ventricle: The cavity size was mildly dilated. Wall   thickness was normal.    Cath 04/16/2016 Conclusion     Mid RCA lesion, 80 %stenosed.  Ost LAD to Prox LAD lesion, 25 %stenosed.  Dist LAD lesion, 50 %stenosed.  There is mild left ventricular systolic dysfunction.  LV end diastolic pressure is normal.  The left ventricular ejection fraction is 45-50% by visual estimate.  There is no mitral valve regurgitation.  There is no aortic valve stenosis.  LV end diastolic pressure is normal.   1. 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 Mild LV dysfunction. EF 45%. 3. Ascending thoracic aortic aneurysm. 4. 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 5. Normal Right heart  And LV filling pressures 6. Normal cardiac output.   Plan: Aortic root grafting and AVR.      Echo 09/16/2016 LV EF: 55% -   60%  Study Conclusions  - Left ventricle: The cavity size was normal. Wall thickness was   increased in a pattern of severe LVH. Systolic function was   normal. The estimated ejection fraction was in the range of 55%   to 60%. - Aortic valve: Valve area (VTI): 4.29 cm^2. Valve area (Vmax):   4.15 cm^2. Valve area (Vmean): 3.88 cm^2. - Mitral valve: Severely calcified annulus. Valve area by   continuity equation (using LVOT flow): 3.35 cm^2. - Pericardium, extracardiac: Localized moderate pericardial   effusion posterior to the LA.    ASSESSMENT:    1. H/O aortic root repair   2. Hyperlipidemia, unspecified hyperlipidemia type   3. Pleural effusion   4. Pericardial effusion   5. Medication management   6. H/O aortic valve replacement   7. Essential hypertension   8. Coronary artery disease involving native coronary artery of native heart without angina pectoris   9. Thrombocytopenia ([place])   10. Anemia, unspecified type   11. PAF (paroxysmal atrial  fibrillation) (HCC)      PLAN:  In order of problems listed above:  1. H/o aortic root repair and AVR: Recovering well. Incision area well-healed. He continued to feel very fatigued. I will check a CBC to make sure her anemia has improved.  2. Postoperative atrial fibrillation: Not on systemic anticoagulation. He will finish the current bottle of amiodarone and stop amiodarone afterward. Unless there is any recurrence, we currently do not have plan for long-term systemic anticoagulation therapy.  3. Right pleural effusion: Markedly diminished breath sounds in the right base consistent with right pleural effusion. I recommended a chest x-ray  4. CAD: No obvious chest pain, 80% mid RCA and a 50% distal LAD lesion on previous cardiac catheterization. RCA is a small nondominant vessel, continue medical therapy  5. Postop thrombocytopenia and anemia: Obtain CBC  6. Pericardial effusion: Noted on recent echocardiogram to have moderate pericardial effusion behind the left atrium, I recommended a repeat limited echocardiogram.  7. Hypertension: Blood pressure well-controlled  8. Hyperlipidemia: Start on Lipitor 40 mg daily for risk factor control. Will need fasting lipid panel and LFTs tomorrow morning    Medication Adjustments/Labs and Tests Ordered: Current medicines are reviewed at length with the patient today.  Concerns regarding medicines are outlined above.  Medication changes, Labs and Tests ordered today are listed in the Patient Instructions below. Patient Instructions  Medication Instructions:  START Atorvastatin 40 mg daily and STOP Amiodarone when bottle is empty  If you need a refill on your cardiac  medications before your next appointment, please call your pharmacy.  Labwork: CBC BMP Fasting Lipid tomorrow HERE IN OUR OFFICE AT [facility]  Testing/Procedures: Your physician has requested that you have an limited echocardiogram. Echocardiography is a painless test that uses  sound waves to create images of your heart. It provides your doctor with information about the size and shape of your heart and how well your heart's chambers and valves are working. This procedure takes approximately one hour. There are no restrictions for this procedure.  A chest x-ray takes a picture of the organs and structures inside the chest, including the heart, lungs, and blood vessels. This test can show several things, including, whether the heart is enlarges; whether fluid is building up in the lungs; and whether pacemaker / defibrillator leads are still in place.   Follow-Up: Your physician wants you to follow-up in: 2 Months with Dr Martinique.  appointment   Thank you for choosing [facility] at [facility]!!         Signed, Almyra Deforest, [place]  10/07/2016 11:49 PM    [place], [place],   [address] Phone: [phone]; Fax: [phone]

## 2016-10-06 NOTE — Progress Notes (Signed)
Moderate sized pleural effusion consistent with diminished breath sound on exam, start lasix 20mg  daily and recheck 2 view CXR in [redacted] week along with repeat BMET at that time. He is pending BMET tomorrow to establish baseline.

## 2016-10-06 NOTE — Telephone Encounter (Signed)
-----   Message from [place], [place] sent at 10/06/2016  1:17 PM EDT ----- Moderate sized pleural effusion consistent with diminished breath sound on exam, start lasix 20mg  daily and recheck 2 view CXR in [redacted] week along with repeat BMET at that time. He is pending BMET tomorrow to establish baseline.

## 2016-10-06 NOTE — Telephone Encounter (Signed)
Called patient, spoke to him and wife. He was advised on results, recommendations going forward.  Rx called to local pharmacy. Pt aware this is intended to reduce the size of the pleural effusion.  BMET and CXR ordered. Pt aware he needs to follow up for the prior-ordered labwork TOMORROW and the BMET and CXT IN ONE WEEK.  Pt still had questions r/t pleural effusion diagnosis. He also had questions regarding fluid intake restrictions. Aware I would route to [facility] to see if patient questions can be more thoroughly addressed.

## 2016-10-06 NOTE — Telephone Encounter (Signed)
I have a long discussion with patient, I informed him to have an earlier visit with her PCP. Unclear if all pleural effusion or could there be a component of pneumonia. He denies any productive cough. I answered all of his questions. He will need to arrange a 3 week followup with me given this moderate pleural effusion.

## 2016-10-06 NOTE — Patient Instructions (Signed)
Medication Instructions:  START Atorvastatin 40 mg daily and STOP Amiodarone when bottle is empty  If you need a refill on your cardiac medications before your next appointment, please call your pharmacy.  Labwork: CBC BMP Fasting Lipid tomorrow HERE IN OUR OFFICE AT [facility]  Testing/Procedures: Your physician has requested that you have an limited echocardiogram. Echocardiography is a painless test that uses sound waves to create images of your heart. It provides your doctor with information about the size and shape of your heart and how well your heart's chambers and valves are working. This procedure takes approximately one hour. There are no restrictions for this procedure.  A chest x-ray takes a picture of the organs and structures inside the chest, including the heart, lungs, and blood vessels. This test can show several things, including, whether the heart is enlarges; whether fluid is building up in the lungs; and whether pacemaker / defibrillator leads are still in place.   Follow-Up: Your physician wants you to follow-up in: 2 Months with Dr Martinique.  appointment   Thank you for choosing [facility] at [facility]!!

## 2016-10-07 ENCOUNTER — Encounter: Payer: Self-pay | Admitting: Physician Assistant

## 2016-10-07 DIAGNOSIS — E785 Hyperlipidemia, unspecified: Secondary | ICD-10-CM | POA: Diagnosis not present

## 2016-10-07 DIAGNOSIS — Z79899 Other long term (current) drug therapy: Secondary | ICD-10-CM | POA: Diagnosis not present

## 2016-10-07 LAB — LIPID PANEL
CHOL/HDL RATIO: 4 ratio (ref 0.0–5.0)
Cholesterol, Total: 135 mg/dL (ref 100–199)
HDL: 34 mg/dL — AB (ref >39)
LDL Calculated: 86 mg/dL (ref 0–99)
TRIGLYCERIDES: 75 mg/dL (ref 0–149)
VLDL Cholesterol Cal: 15 mg/dL (ref 5–40)

## 2016-10-07 LAB — BASIC METABOLIC PANEL
BUN/Creatinine Ratio: 13 (ref 10–24)
BUN: 11 mg/dL (ref 8–27)
CALCIUM: 8.9 mg/dL (ref 8.6–10.2)
CO2: 24 mmol/L (ref 20–29)
CREATININE: 0.84 mg/dL (ref 0.76–1.27)
Chloride: 93 mmol/L — ABNORMAL LOW (ref 96–106)
GFR calc Af Amer: 105 mL/min/{1.73_m2} (ref >59)
GFR calc non Af Amer: 91 mL/min/{1.73_m2} (ref >59)
Glucose: 106 mg/dL — ABNORMAL HIGH (ref 65–99)
POTASSIUM: 4.2 mmol/L (ref 3.5–5.2)
Sodium: 133 mmol/L — ABNORMAL LOW (ref 134–144)

## 2016-10-07 LAB — CBC
HEMATOCRIT: 32 % — AB (ref 37.5–51.0)
HEMOGLOBIN: 10.4 g/dL — AB (ref 13.0–17.7)
MCH: 28.5 pg (ref 26.6–33.0)
MCHC: 32.5 g/dL (ref 31.5–35.7)
MCV: 88 fL (ref 79–97)
Platelets: 202 10*3/uL (ref 150–379)
RBC: 3.65 x10E6/uL — ABNORMAL LOW (ref 4.14–5.80)
RDW: 14.7 % (ref 12.3–15.4)
WBC: 8.3 10*3/uL (ref 3.4–10.8)

## 2016-10-08 ENCOUNTER — Ambulatory Visit: Payer: Medicare Other | Admitting: Physician Assistant

## 2016-10-08 ENCOUNTER — Telehealth (HOSPITAL_COMMUNITY): Payer: Self-pay | Admitting: Physician Assistant

## 2016-10-08 NOTE — Progress Notes (Signed)
Red blood cell count improved. Kidney function and electrolyte stable, sodium level borderline low but should not cause any problem at this level

## 2016-10-09 ENCOUNTER — Telehealth: Payer: Self-pay | Admitting: *Deleted

## 2016-10-09 ENCOUNTER — Ambulatory Visit (HOSPITAL_COMMUNITY): Payer: Medicare Other | Attending: Cardiovascular Disease

## 2016-10-09 ENCOUNTER — Other Ambulatory Visit: Payer: Self-pay

## 2016-10-09 DIAGNOSIS — I119 Hypertensive heart disease without heart failure: Secondary | ICD-10-CM | POA: Insufficient documentation

## 2016-10-09 DIAGNOSIS — I340 Nonrheumatic mitral (valve) insufficiency: Secondary | ICD-10-CM | POA: Diagnosis not present

## 2016-10-09 DIAGNOSIS — I2510 Atherosclerotic heart disease of native coronary artery without angina pectoris: Secondary | ICD-10-CM | POA: Diagnosis not present

## 2016-10-09 DIAGNOSIS — I3139 Other pericardial effusion (noninflammatory): Secondary | ICD-10-CM

## 2016-10-09 DIAGNOSIS — R5381 Other malaise: Secondary | ICD-10-CM

## 2016-10-09 DIAGNOSIS — Z8679 Personal history of other diseases of the circulatory system: Secondary | ICD-10-CM

## 2016-10-09 DIAGNOSIS — I77810 Thoracic aortic ectasia: Secondary | ICD-10-CM | POA: Diagnosis not present

## 2016-10-09 DIAGNOSIS — I313 Pericardial effusion (noninflammatory): Secondary | ICD-10-CM | POA: Diagnosis not present

## 2016-10-09 DIAGNOSIS — G4730 Sleep apnea, unspecified: Secondary | ICD-10-CM | POA: Insufficient documentation

## 2016-10-09 DIAGNOSIS — Z9889 Other specified postprocedural states: Secondary | ICD-10-CM

## 2016-10-09 DIAGNOSIS — Z952 Presence of prosthetic heart valve: Secondary | ICD-10-CM | POA: Diagnosis not present

## 2016-10-09 DIAGNOSIS — E785 Hyperlipidemia, unspecified: Secondary | ICD-10-CM | POA: Diagnosis not present

## 2016-10-09 NOTE — Telephone Encounter (Signed)
User: Cherie Dark A Date/time: 10/08/16 9:35 AM  Comment: Called pt and lmsg for him to CB to change the time of his echo appt for tomorrow.   Context:  Outcome: Left Message  Phone number: [phone] Phone Type: Home Phone  Comm. type: Telephone Call type: Outgoing  Contact: [name] Relation to patient: Self

## 2016-10-09 NOTE — Telephone Encounter (Signed)
I have discussed w patient. He is aware we are placing referral for this - there should be home services available, either through [facility] or similar agency. This would likely depend on his insurance and I will send msg for scheduling to review in workqueue.

## 2016-10-09 NOTE — Telephone Encounter (Signed)
-----   Message from [place], [place] sent at 10/09/2016  2:46 PM EDT ----- Regarding: FW: cardiac rehab vs physical therapy Patient to be referred for physical therapy. Not sure if there is home PT  Signed, Almyra Deforest PA Pager: [phone]  ----- Message ----- From: Martinique, [name], MD Sent: 10/08/2016   8:31 PM To: Almyra Deforest, PA Subject: RE: cardiac rehab vs physical therapy          PT may be a better start. Can then do Rehab later once improved with PT  Collier [place] ----- Message ----- From: Almyra Deforest, [place] Sent: 10/06/2016  11:27 AM To: [name] Martinique, MD Subject: cardiac rehab vs physical therapy              S/p aortic root replacement and AVR, seen today for followup, appears to be quite deconditioned, do you recommend cardiac rehab or PT?  Hilbert Corrigan PA Pager: [phone]

## 2016-10-14 ENCOUNTER — Encounter (HOSPITAL_COMMUNITY): Payer: Self-pay

## 2016-10-14 ENCOUNTER — Ambulatory Visit
Admission: RE | Admit: 2016-10-14 | Discharge: 2016-10-14 | Disposition: A | Discharge status: Home / Self Care | Payer: Medicare Other | Source: Ambulatory Visit | Attending: Physician Assistant | Admitting: Physician Assistant

## 2016-10-14 ENCOUNTER — Emergency Department (HOSPITAL_COMMUNITY)
Admission: EM | Admit: 2016-10-14 | Discharge: 2016-10-14 | Disposition: A | Discharge status: Home / Self Care | Payer: Medicare Other | Attending: Emergency Medicine | Admitting: Emergency Medicine

## 2016-10-14 ENCOUNTER — Other Ambulatory Visit: Payer: Self-pay

## 2016-10-14 DIAGNOSIS — I2510 Atherosclerotic heart disease of native coronary artery without angina pectoris: Secondary | ICD-10-CM | POA: Diagnosis not present

## 2016-10-14 DIAGNOSIS — E785 Hyperlipidemia, unspecified: Secondary | ICD-10-CM | POA: Diagnosis not present

## 2016-10-14 DIAGNOSIS — R404 Transient alteration of awareness: Secondary | ICD-10-CM | POA: Diagnosis not present

## 2016-10-14 DIAGNOSIS — D649 Anemia, unspecified: Secondary | ICD-10-CM | POA: Insufficient documentation

## 2016-10-14 DIAGNOSIS — R55 Syncope and collapse: Secondary | ICD-10-CM | POA: Diagnosis not present

## 2016-10-14 DIAGNOSIS — Z87891 Personal history of nicotine dependence: Secondary | ICD-10-CM | POA: Insufficient documentation

## 2016-10-14 DIAGNOSIS — I10 Essential (primary) hypertension: Secondary | ICD-10-CM | POA: Insufficient documentation

## 2016-10-14 DIAGNOSIS — J90 Pleural effusion, not elsewhere classified: Secondary | ICD-10-CM | POA: Diagnosis not present

## 2016-10-14 DIAGNOSIS — Z79899 Other long term (current) drug therapy: Secondary | ICD-10-CM | POA: Insufficient documentation

## 2016-10-14 LAB — CBC WITH DIFFERENTIAL/PLATELET
Basophils Absolute: 0 K/uL (ref 0.0–0.1)
Basophils Relative: 0 %
Eosinophils Absolute: 0.1 K/uL (ref 0.0–0.7)
Eosinophils Relative: 1 %
HCT: 33.1 % — ABNORMAL LOW (ref 39.0–52.0)
Hemoglobin: 10.3 g/dL — ABNORMAL LOW (ref 13.0–17.0)
Lymphocytes Relative: 6 %
Lymphs Abs: 0.7 K/uL (ref 0.7–4.0)
MCH: 27.7 pg (ref 26.0–34.0)
MCHC: 31.1 g/dL (ref 30.0–36.0)
MCV: 89 fL (ref 78.0–100.0)
Monocytes Absolute: 0.7 K/uL (ref 0.1–1.0)
Monocytes Relative: 6 %
Neutro Abs: 9.9 K/uL — ABNORMAL HIGH (ref 1.7–7.7)
Neutrophils Relative %: 87 %
Platelets: 190 K/uL (ref 150–400)
RBC: 3.72 MIL/uL — ABNORMAL LOW (ref 4.22–5.81)
RDW: 14.9 % (ref 11.5–15.5)
WBC: 11.4 K/uL — ABNORMAL HIGH (ref 4.0–10.5)

## 2016-10-14 LAB — COMPREHENSIVE METABOLIC PANEL WITH GFR
ALT: 63 U/L (ref 17–63)
AST: 42 U/L — ABNORMAL HIGH (ref 15–41)
Albumin: 3.1 g/dL — ABNORMAL LOW (ref 3.5–5.0)
Alkaline Phosphatase: 130 U/L — ABNORMAL HIGH (ref 38–126)
Anion gap: 15 (ref 5–15)
BUN: 11 mg/dL (ref 6–20)
CO2: 20 mmol/L — ABNORMAL LOW (ref 22–32)
Calcium: 9 mg/dL (ref 8.9–10.3)
Chloride: 98 mmol/L — ABNORMAL LOW (ref 101–111)
Creatinine, Ser: 1.05 mg/dL (ref 0.61–1.24)
GFR calc Af Amer: >60 mL/min
GFR calc non Af Amer: >60 mL/min
Glucose, Bld: 102 mg/dL — ABNORMAL HIGH (ref 65–99)
Potassium: 4.1 mmol/L (ref 3.5–5.1)
Sodium: 133 mmol/L — ABNORMAL LOW (ref 135–145)
Total Bilirubin: 1.3 mg/dL — ABNORMAL HIGH (ref 0.3–1.2)
Total Protein: 7.5 g/dL (ref 6.5–8.1)

## 2016-10-14 LAB — BASIC METABOLIC PANEL
BUN / CREAT RATIO: 12 (ref 10–24)
BUN: 11 mg/dL (ref 8–27)
CO2: 22 mmol/L (ref 20–29)
CREATININE: 0.95 mg/dL (ref 0.76–1.27)
Calcium: 9.1 mg/dL (ref 8.6–10.2)
Chloride: 97 mmol/L (ref 96–106)
GFR calc non Af Amer: 83 mL/min/{1.73_m2} (ref >59)
GFR, EST AFRICAN AMERICAN: 96 mL/min/{1.73_m2} (ref >59)
GLUCOSE: 104 mg/dL — AB (ref 65–99)
Potassium: 4 mmol/L (ref 3.5–5.2)
SODIUM: 137 mmol/L (ref 134–144)

## 2016-10-14 LAB — TROPONIN I: Troponin I: <0.03 ng/mL

## 2016-10-14 NOTE — Telephone Encounter (Signed)
Size of pleural effusion has not changed much, reassess on followup.

## 2016-10-14 NOTE — ED Triage Notes (Signed)
Pt arrives EMS from MD office where he was doing follow up from open heart surg on August 21 when he bacame weak and diaophoretic. Pt alert and oriented with no pain or short of breath.

## 2016-10-14 NOTE — Discharge Planning (Signed)
Yama Nielson J. Clydene Laming, RN, BSN, [employer] [phone] Spoke with pt at bedside regarding discharge planning for [facility]. Offered pt list of home health agencies to choose from.  Pt chose [facility] to render services. [facility] notified. Patient made aware that [facility] will be in contact in 24-48 hours.  No DME needs identified at this time.

## 2016-10-14 NOTE — ED Notes (Signed)
ED Provider at bedside. 

## 2016-10-14 NOTE — ED Provider Notes (Signed)
Medical screening examination/treatment/procedure(s) were conducted as a shared visit with non-physician practitioner(s) and myself.  I personally evaluated the patient during the encounter.  Clinical Impression:   Final diagnoses:  Near syncope    The patient is a 66 year old male, recent history of aortic root repair, presents to the hospital complaining of a near syncopal episode that occurred while he went to get an x-ray as morning. According to the medical record and the patient with his significant other he has had a somewhat, located postoperative course in that he has had a pericardial and a pleural effusion. His symptoms of been minimal but he has not been doing much in the way of rehabilitation exercises.  He was ambulating with a walker today - much more than he has been doing since surgery.  He had XRay done and this was unchanged.  When he was standing for his xray he had some near syncope and some nausea - then was redirected to the ED   On exam the patient has clear lung sounds, some diminished sounds at bases, normal heart sounds without significant murmurs rubs or gallops, normal pulses at the radial arteries, no edema and no JVD.  ECG shows some abnormal T wave inversions diffusely.  Labs reviewed Pt informed of plan - in agreement. Dr. Martinique consulted by Ms [facility] PA-C.   EKG Interpretation  Date/Time:  Wednesday October 14 2016 10:06:28 EDT Ventricular Rate:  78 PR Interval:  166 QRS Duration: 96 QT Interval:  408 QTC Calculation: 465 R Axis:   24 Text Interpretation:  Normal sinus rhythm ST & T wave abnormality, consider inferolateral ischemia Prolonged QT Abnormal ECG Since last tracing T wave abnormality present diffusely Confirmed by Noemi Chapel [phone]) on 10/14/2016 10:12:21 AM         Noemi Chapel, MD 10/23/16 [phone]

## 2016-10-14 NOTE — ED Notes (Signed)
Iv attempted x 2 without success. 

## 2016-10-14 NOTE — ED Provider Notes (Signed)
[facility] DEPT Provider Note   CSN: [id number] Arrival date & time: 10/14/16  1505     History   Chief Complaint Chief Complaint  Patient presents with  . Near Syncope    HPI RIC ROSENBERG is a 66 y.o. male with a pmhx of HTN, HLD, severe AI with recent aortic root replacement and AVR on 09/15/16 who presented to the ED today complaining of near syncope.   Per recent cardiology note on 9/11:  Pt underwent preoperative evaluation with cardiac catheterization on 04/14/2016 which showed moderate to severe AI, EF 45%, 80% stenosis in RCA, 50% distal LAD. He eventually underwent the planned procedure with ascending aortic root replacement using 32 mm Gelweave Valsalva Graft and AVR using 29 mm Edwards Perimount Magna Ease aortic bioprosthesis valve on 09/15/2016 by Dr. Servando Snare. Echo obtained on the following day on 09/14/2016 showed EF 55-60%, severe LVH, severely calcified mitral annulus without significant stenosis, moderate pericardial effusion posterior to the left atrium. Postoperatively, he did have some volume overload that responded well to diuretic. He also had postoperative thrombocytopenia as well. He was placed on oral amiodarone for postoperative atrial fibrillation.  During the cardiology follow up on 9/11, pt was noted to have diminished breath sounds and was found to have a pleural effusion on CXR. Pt was instructed to have repeat labs and CXR today for further continued follow up and monitoring. He states that he got his blood drawn today, but upon standing for the CXR he felt very lightheaded as if he were going to pass out. He attempted to walk with a walker outside but became very diaphoretic. Denies any chest pain, shortness of breath, numbness/tingling, weakness, vision changes, slurred speech or true syncope. He denies any hematochezia His stool has been dark but he is also on oral iron therapy for post op anemia. Currently pt no longer feels lightheaded but states that he  feels very tired.   HPI  Past Medical History:  Diagnosis Date  . Anemia   . Arthritis   . Asymptomatic bilateral carotid artery stenosis 08/2015   1-39%   . Blood dyscrasia    "trouble with my bloods clotting"  . Bruising    on the skin states due to platelets or sometimes low or high  . Cataracts, bilateral   . Coronary artery disease   . Diverticulosis   . Enlarged aorta ([place])   . Enlarged prostate    slightly  . GERD (gastroesophageal reflux disease)    takes Pantoprazole daily as needed  . Glaucoma    uses eye drops daily  . Headache   . History of colon polyps    benign  . History of kidney stones   . Hyperlipidemia    no on any meds  . Hypertension    takes Amlodipine and Atenolol daily  . Joint pain   . Joint swelling   . Nocturia   . Sleep apnea   . Vocal cord nodule    pt. states  it's a" growth on vocal cord"    Patient Active Problem List   Diagnosis Date Noted  . S/P AVR 09/15/2016  . Coronary artery disease involving native coronary artery of native heart without angina pectoris 04/29/2016  . Aortic insufficiency 04/01/2016  . Thoracic aortic aneurysm ([place]) 09/18/2015  . HTN (hypertension) 09/18/2015  . Hyperlipidemia 09/18/2015  . Asymptomatic bilateral carotid artery stenosis 08/27/2015    Past Surgical History:  Procedure Laterality Date  . AORTIC ARCH ANGIOGRAPHY N/A 04/16/2016  Procedure: Aortic Arch Angiography;  Surgeon: [name] Martinique, MD;  Location: [facility];  Service: Cardiovascular;  Laterality: N/A;  . AORTIC VALVE REPLACEMENT N/A 09/15/2016   Procedure: AORTIC VALVE REPLACEMENT (AVR);  Surgeon: Grace Isaac, MD;  Location: [facility];  Service: Open Heart Surgery;  Laterality: N/A;  Using 48mm Edwards Perimount Magna Ease Aortic Bioprosthesis Valve  . ASCENDING AORTIC ROOT REPLACEMENT N/A 09/15/2016   Procedure: ASCENDING AORTIC ROOT REPLACEMENT;  Surgeon: Grace Isaac, MD;  Location: [facility];  Service: Open Heart  Surgery;  Laterality: N/A;  Using 3mm Gelweave Valsalva Graft  . COLONOSCOPY    . COLONOSCOPY WITH ESOPHAGOGASTRODUODENOSCOPY (EGD)    . MULTIPLE EXTRACTIONS WITH ALVEOLOPLASTY N/A 06/10/2016   Procedure: Extraction of tooth #'s 2,8,13,15, and 29  with alveoloplasty, maxillary right and left buccal exostoses reductions, and gross debridement of remaining teeth.;  Surgeon: Lenn Cal, DDS;  Location: [facility];  Service: Oral Surgery;  Laterality: N/A;  . RIGHT/LEFT HEART CATH AND CORONARY ANGIOGRAPHY N/A 04/16/2016   Procedure: Right/Left Heart Cath and Coronary Angiography;  Surgeon: [name] Martinique, MD;  Location: [facility];  Service: Cardiovascular;  Laterality: N/A;  . TEE WITHOUT CARDIOVERSION N/A 09/15/2016   Procedure: TRANSESOPHAGEAL ECHOCARDIOGRAM (TEE);  Surgeon: Grace Isaac, MD;  Location: [facility];  Service: Open Heart Surgery;  Laterality: N/A;       Home Medications    Prior to Admission medications   Medication Sig Start Date End Date Taking? Authorizing Provider  amiodarone (PACERONE) 200 MG tablet Take 1 tablet (200 mg total) by mouth 2 (two) times daily after a meal. 09/23/16   Gold, Wayne E, PA-C  amLODipine (NORVASC) 10 MG tablet Take 10 mg by mouth daily.    [provider]  aspirin EC 81 MG EC tablet Take 1 tablet (81 mg total) by mouth daily. 09/24/16   Gold, Wayne E, PA-C  atenolol (TENORMIN) 25 MG tablet Take 0.5 tablets (12.5 mg total) by mouth 2 (two) times daily. 09/23/16   Gold, Wayne E, PA-C  atorvastatin (LIPITOR) 40 MG tablet Take 1 tablet (40 mg total) by mouth daily. 10/06/16 01/04/17  Almyra Deforest, PA  Ferrous Sulfate (IRON) 325 (65 Fe) MG TABS Take 1 tablet (325 mg total) by mouth 2 (two) times daily with a meal. 10/02/16   Grace Isaac, MD  folic acid (FOLVITE) 1 MG tablet Take 1 tablet (1 mg total) by mouth daily. 09/24/16   Jadene Pierini E, PA-C  furosemide (LASIX) 20 MG tablet Take 1 tablet (20 mg total) by mouth daily. 10/06/16  01/04/17  Almyra Deforest, PA  latanoprost (XALATAN) 0.005 % ophthalmic solution Place 1 drop into both eyes at bedtime.    [provider]  Multiple Vitamin (MULTIVITAMIN WITH MINERALS) TABS tablet Take 1 tablet by mouth daily.    [provider]  pantoprazole (PROTONIX) 40 MG tablet Take 20 mg by mouth daily as needed (indigestion).     [provider]  traMADol (ULTRAM) 50 MG tablet Take 1 tablet (50 mg total) by mouth every 6 (six) hours as needed for moderate pain. 09/23/16   [name], PA-C    Family History Family History  Problem Relation Age of Onset  . Heart disease Father   . Heart attack Father   . Thyroid disease Sister     Social History Social History  Substance Use Topics  . Smoking status: Former Smoker    Packs/day: 1.00  Years: 25.00    Types: Cigarettes  . Smokeless tobacco: Never Used  . Alcohol use No     Allergies   No known allergies   Review of Systems Review of Systems  All other systems reviewed and are negative.    Physical Exam Updated Vital Signs BP 114/79 (BP Location: Right Arm)   Pulse 76   Temp 98.8 F (37.1 C) (Oral)   Resp 12   Ht 5\' 9"  (1.753 m)   Wt 83.9 kg (185 lb)   SpO2 96%   BMI 27.32 kg/m   Physical Exam  Constitutional: He is oriented to person, place, and time. He appears well-developed and well-nourished. No distress.  HENT:  Head: Normocephalic and atraumatic.  Mouth/Throat: No oropharyngeal exudate.  Eyes: Pupils are equal, round, and reactive to light. Conjunctivae and EOM are normal. Right eye exhibits no discharge. Left eye exhibits no discharge. No scleral icterus.  Cardiovascular: Normal rate, regular rhythm, normal heart sounds and intact distal pulses.  Exam reveals no gallop and no friction rub.   No murmur heard. Pulmonary/Chest: Effort normal and breath sounds normal. No respiratory distress. He has no wheezes. He has no rales. He exhibits no tenderness.  Abdominal: Soft. He  exhibits no distension. There is no tenderness. There is no guarding.  Musculoskeletal: Normal range of motion. He exhibits no edema.  Neurological: He is alert and oriented to person, place, and time. No cranial nerve deficit.  Strength 5/5 throughout. No sensory deficits. . No dysmetria. No slurred speech. No facial droop. Negative pronator drift.    Skin: Skin is warm and dry. No rash noted. He is not diaphoretic. No erythema. No pallor.  Psychiatric: He has a normal mood and affect. His behavior is normal.  Nursing note and vitals reviewed.    ED Treatments / Results  Labs (all labs ordered are listed, but only abnormal results are displayed) Labs Reviewed  COMPREHENSIVE METABOLIC PANEL  TROPONIN I  CBC WITH DIFFERENTIAL/PLATELET    EKG  EKG Interpretation  Date/Time:  Wednesday October 14 2016 10:06:28 EDT Ventricular Rate:  78 PR Interval:  166 QRS Duration: 96 QT Interval:  408 QTC Calculation: 465 R Axis:   24 Text Interpretation:  Normal sinus rhythm ST & T wave abnormality, consider inferolateral ischemia Prolonged QT Abnormal ECG Since last tracing T wave abnormality present diffusely Confirmed by Noemi Chapel [phone]) on 10/14/2016 10:12:21 AM       Radiology Dg Chest 2 View  Result Date: 10/14/2016 CLINICAL DATA:  Valve replacement EXAM: CHEST  2 VIEW COMPARISON:  10/06/2016 FINDINGS: Stable moderate size right pleural effusion and basilar opacity. Vascularity is within normal limits. No pneumothorax. Left lung is clear. Postop changes in the mediastinum are noted. Aortic valve replacement hardware is stable in position. IMPRESSION: Stable right pleural effusion and basilar pulmonary opacity. No pneumothorax or pulmonary edema. Electronically Signed   By: Marybelle Killings M.D.   On: 10/14/2016 09:06    Procedures Procedures (including critical care time)  Medications Ordered in ED Medications - No data to display   Initial Impression / Assessment and Plan /  ED Course  I have reviewed the triage vital signs and the nursing notes.  Pertinent labs & imaging results that were available during my care of the patient were reviewed by me and considered in my medical decision making (see chart for details).      66 year old male with history of HTN, HLD, severe AI s/p recent aortic root replacement in  aVR who presented to the ED today after a near syncopal event. Patient was having follow-up labs and a follow-up chest x-ray performed today and upon standing became very lightheaded and diaphoretic and nearly passed out. He denied any chest pain or shortness of breath at the time. He currently feels well but very tired. On arrival to ED, vitals are stable. Troponin within normal limits. EKG did show new T-wave inversions diffusely. Per chart review postoperatively patient had a moderate size pericardial effusion and an EKG with diffuse ST elevations suspicious for pericarditis at the time. He had a follow-up echo on 9/14 that showed that his pericardial effusion was now trivial. He also had a postoperative pleural effusion has been taking Lasix as prescribed by his cardiologist, Dr. Martinique. CXR today showed stable pleural effusion. Pt denies any difficulty breathing.    Given these new EKG findings I contacted and spoke with Dr. Martinique who reviewed the EKG and felt that these changes may be evolutionary. Given that his most recent cardiac catheterization was without CAD, Dr. Martinique did not feel concerned about these EKG changes. Instructed to keep follow up appointment scheduled for November. I suspect that pt experienced a vagal response coupled by the fact that he had not eaten for almost 24 hours that caused his near syncope. Hgb stable. Pt reports that he has been experiencing dysphagia since his surgery and has not been eating well. I will provide a referral to GI for consideration of EGD vs barium swallow study. Discussed findings with pt. Pt stable for discharge  with aforementioned follow up. Return precautions outlined in patient discharge instructions.   Patient was discussed with and seen by Dr. Sabra Heck who agrees with the treatment plan.    Final Clinical Impressions(s) / ED Diagnoses   Final diagnoses:  Near syncope    New Prescriptions New Prescriptions   No medications on file     Carlos Levering, PA-C 10/14/16 1545    Noemi Chapel, MD 10/23/16 [phone]

## 2016-10-14 NOTE — Telephone Encounter (Signed)
Kidney function stable, aware the ED visit for presyncope, will continue to followup

## 2016-10-14 NOTE — Discharge Instructions (Signed)
Please keep your scheduled cardiology follow-up appointment with Dr. Martinique. I have placed a referral to [facility] gastroenterology as well for your ongoing difficulty swallowing. Please return to the emergency department if you experience severe worsening of your symptoms, chest pain, difficulty breathing, loss of consciousness.

## 2016-10-15 NOTE — Telephone Encounter (Signed)
3-4 weeks with me and repeat CXR at that time.

## 2016-10-16 ENCOUNTER — Telehealth: Payer: Self-pay | Admitting: Physician Assistant

## 2016-10-16 DIAGNOSIS — Z9181 History of falling: Secondary | ICD-10-CM | POA: Diagnosis not present

## 2016-10-16 DIAGNOSIS — E785 Hyperlipidemia, unspecified: Secondary | ICD-10-CM | POA: Diagnosis not present

## 2016-10-16 DIAGNOSIS — Z87891 Personal history of nicotine dependence: Secondary | ICD-10-CM | POA: Diagnosis not present

## 2016-10-16 DIAGNOSIS — Z952 Presence of prosthetic heart valve: Secondary | ICD-10-CM | POA: Diagnosis not present

## 2016-10-16 DIAGNOSIS — G4730 Sleep apnea, unspecified: Secondary | ICD-10-CM | POA: Diagnosis not present

## 2016-10-16 DIAGNOSIS — Z48812 Encounter for surgical aftercare following surgery on the circulatory system: Secondary | ICD-10-CM | POA: Diagnosis not present

## 2016-10-16 DIAGNOSIS — Z86010 Personal history of colonic polyps: Secondary | ICD-10-CM | POA: Diagnosis not present

## 2016-10-16 DIAGNOSIS — M1611 Unilateral primary osteoarthritis, right hip: Secondary | ICD-10-CM | POA: Diagnosis not present

## 2016-10-16 DIAGNOSIS — Z7982 Long term (current) use of aspirin: Secondary | ICD-10-CM | POA: Diagnosis not present

## 2016-10-16 DIAGNOSIS — D649 Anemia, unspecified: Secondary | ICD-10-CM | POA: Diagnosis not present

## 2016-10-16 DIAGNOSIS — H409 Unspecified glaucoma: Secondary | ICD-10-CM | POA: Diagnosis not present

## 2016-10-16 DIAGNOSIS — I2510 Atherosclerotic heart disease of native coronary artery without angina pectoris: Secondary | ICD-10-CM | POA: Diagnosis not present

## 2016-10-16 DIAGNOSIS — I10 Essential (primary) hypertension: Secondary | ICD-10-CM | POA: Diagnosis not present

## 2016-10-16 DIAGNOSIS — Z79891 Long term (current) use of opiate analgesic: Secondary | ICD-10-CM | POA: Diagnosis not present

## 2016-10-16 DIAGNOSIS — K219 Gastro-esophageal reflux disease without esophagitis: Secondary | ICD-10-CM | POA: Diagnosis not present

## 2016-10-16 NOTE — Telephone Encounter (Signed)
Results and recommendations from CXR and BMET results discussed with patient, who verbalized understanding and thanks. I have scheduled him to f/u in 3 weeks. He's aware to call if new concerns in interim.

## 2016-10-16 NOTE — Telephone Encounter (Signed)
New message    Pt is stating he is returnig call to nurse.

## 2016-10-21 DIAGNOSIS — M1611 Unilateral primary osteoarthritis, right hip: Secondary | ICD-10-CM | POA: Diagnosis not present

## 2016-10-21 DIAGNOSIS — Z48812 Encounter for surgical aftercare following surgery on the circulatory system: Secondary | ICD-10-CM | POA: Diagnosis not present

## 2016-10-21 DIAGNOSIS — D649 Anemia, unspecified: Secondary | ICD-10-CM | POA: Diagnosis not present

## 2016-10-21 DIAGNOSIS — I10 Essential (primary) hypertension: Secondary | ICD-10-CM | POA: Diagnosis not present

## 2016-10-21 DIAGNOSIS — H409 Unspecified glaucoma: Secondary | ICD-10-CM | POA: Diagnosis not present

## 2016-10-21 DIAGNOSIS — I2510 Atherosclerotic heart disease of native coronary artery without angina pectoris: Secondary | ICD-10-CM | POA: Diagnosis not present

## 2016-10-22 ENCOUNTER — Ambulatory Visit: Payer: Medicare Other | Attending: Internal Medicine | Admitting: Speech Pathology

## 2016-10-22 ENCOUNTER — Other Ambulatory Visit (HOSPITAL_COMMUNITY): Payer: Self-pay | Admitting: Internal Medicine

## 2016-10-22 DIAGNOSIS — R110 Nausea: Secondary | ICD-10-CM | POA: Diagnosis not present

## 2016-10-22 DIAGNOSIS — Z6828 Body mass index (BMI) 28.0-28.9, adult: Secondary | ICD-10-CM | POA: Diagnosis not present

## 2016-10-22 DIAGNOSIS — G4733 Obstructive sleep apnea (adult) (pediatric): Secondary | ICD-10-CM | POA: Diagnosis not present

## 2016-10-22 DIAGNOSIS — E784 Other hyperlipidemia: Secondary | ICD-10-CM | POA: Diagnosis not present

## 2016-10-22 DIAGNOSIS — R1311 Dysphagia, oral phase: Secondary | ICD-10-CM | POA: Insufficient documentation

## 2016-10-22 DIAGNOSIS — D649 Anemia, unspecified: Secondary | ICD-10-CM | POA: Diagnosis not present

## 2016-10-22 DIAGNOSIS — M25551 Pain in right hip: Secondary | ICD-10-CM | POA: Diagnosis not present

## 2016-10-22 DIAGNOSIS — D509 Iron deficiency anemia, unspecified: Secondary | ICD-10-CM | POA: Diagnosis not present

## 2016-10-22 DIAGNOSIS — J90 Pleural effusion, not elsewhere classified: Secondary | ICD-10-CM

## 2016-10-22 DIAGNOSIS — R05 Cough: Secondary | ICD-10-CM | POA: Diagnosis not present

## 2016-10-22 NOTE — Therapy (Signed)
[facility] [address] [place], [place], [address] Phone: [phone]   Fax:  [phone]  Speech Language Pathology Evaluation  Patient Details  Name: Lonnie Hansen MRN: [id number] Date of Birth: 1950-08-23 Referring Provider: [name]   Encounter Date: 10/22/2016      End of Session - 10/22/16 1259    Visit Number 1   Number of Visits 5   Date for SLP Re-Evaluation 11/27/16   SLP Start Time 1016   SLP Stop Time  1056   SLP Time Calculation (min) 40 min   Activity Tolerance Other (comment)  pt limited by nausea      Past Medical History:  Diagnosis Date  . Anemia   . Arthritis   . Asymptomatic bilateral carotid artery stenosis 08/2015   1-39%   . Blood dyscrasia    "trouble with my bloods clotting"  . Bruising    on the skin states due to platelets or sometimes low or high  . Cataracts, bilateral   . Coronary artery disease   . Diverticulosis   . Enlarged aorta ([place])   . Enlarged prostate    slightly  . GERD (gastroesophageal reflux disease)    takes Pantoprazole daily as needed  . Glaucoma    uses eye drops daily  . Headache   . History of colon polyps    benign  . History of kidney stones   . Hyperlipidemia    no on any meds  . Hypertension    takes Amlodipine and Atenolol daily  . Joint pain   . Joint swelling   . Nocturia   . Sleep apnea   . Vocal cord nodule    pt. states  it's a" growth on vocal cord"    Past Surgical History:  Procedure Laterality Date  . AORTIC ARCH ANGIOGRAPHY N/A 04/16/2016   Procedure: Aortic Arch Angiography;  Surgeon: [name] Martinique, MD;  Location: [facility];  Service: Cardiovascular;  Laterality: N/A;  . AORTIC VALVE REPLACEMENT N/A 09/15/2016   Procedure: AORTIC VALVE REPLACEMENT (AVR);  Surgeon: Grace Isaac, MD;  Location: [facility];  Service: Open Heart Surgery;  Laterality: N/A;  Using 42mm Edwards Perimount Magna Ease Aortic  Bioprosthesis Valve  . ASCENDING AORTIC ROOT REPLACEMENT N/A 09/15/2016   Procedure: ASCENDING AORTIC ROOT REPLACEMENT;  Surgeon: Grace Isaac, MD;  Location: [facility];  Service: Open Heart Surgery;  Laterality: N/A;  Using 84mm Gelweave Valsalva Graft  . COLONOSCOPY    . COLONOSCOPY WITH ESOPHAGOGASTRODUODENOSCOPY (EGD)    . MULTIPLE EXTRACTIONS WITH ALVEOLOPLASTY N/A 06/10/2016   Procedure: Extraction of tooth #'s 2,8,13,15, and 29  with alveoloplasty, maxillary right and left buccal exostoses reductions, and gross debridement of remaining teeth.;  Surgeon: Lenn Cal, DDS;  Location: [facility];  Service: Oral Surgery;  Laterality: N/A;  . RIGHT/LEFT HEART CATH AND CORONARY ANGIOGRAPHY N/A 04/16/2016   Procedure: Right/Left Heart Cath and Coronary Angiography;  Surgeon: [name] Martinique, MD;  Location: [facility];  Service: Cardiovascular;  Laterality: N/A;  . TEE WITHOUT CARDIOVERSION N/A 09/15/2016   Procedure: TRANSESOPHAGEAL ECHOCARDIOGRAM (TEE);  Surgeon: Grace Isaac, MD;  Location: [facility];  Service: Open Heart Surgery;  Laterality: N/A;    There were no vitals filed for this visit.      Subjective Assessment - 10/22/16 1019    Subjective "I am so worried about my inability to swallow that it is really concerning me."  Currently in Pain? No/denies  pt complaining of nausea            SLP Evaluation [facility] - 10/22/16 1019      SLP Visit Information   SLP Received On 10/22/16   Referring Provider PATERSON, DANIEL    Onset Date 09/22/16   Medical Diagnosis dysphagia     General Information   HPI The patient is a 66 year old male who first reported difficulty swallowing 09/22/16 s/p aortic valve replacement, ascending aortic root replacement. Pt was intubated for total of 3 days following procedure. 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 He states he has a hard time swallowing/masticating foods due to gagging. Pt  also reporting anxiety re: his health. OP MBS 10/05/16 revealed: oral deficits mostly marked by oral holding, delay in transiting, premature spillage and piecemealing across all consistencies. SLP questioned if pt's excessive gag may be a conditioned response due to negative experiences with po of solids, recommended follow up for sensitive gag response to help determine source/possible treatment options as this is impacting his ability to meet nutritional needs.    Behavioral/Cognition alert, anxious, complains of nausea and is belching/gagging frequently   Mobility Status ambulated with cane into session; requested wheelchair for return to car     Prior Functional Status   Cognitive/Linguistic Baseline Within functional limits   Type of Home House     Cognition   Overall Cognitive Status Within Functional Limits for tasks assessed     Auditory Comprehension   Overall Auditory Comprehension Appears within functional limits for tasks assessed     Visual Recognition/Discrimination   Discrimination Not tested     Reading Comprehension   Reading Status Not tested     Oral Motor/Sensory Function   Overall Oral Motor/Sensory Function Appears within functional limits for tasks assessed     Motor Speech   Overall Motor Speech Appears within functional limits for tasks assessed     Standardized Assessments   Standardized Assessments  Other Assessment  EAT-10 28/40     Assessment   SLP Visit Diagnosis Dysphagia, unspecified (R13.10)                         SLP Education - 10/22/16 1258    Education provided Yes   Education Details GERD precautions, follow up with ENT/GI may be beneficial, attempt purees   Person(s) Educated Patient  brother   Methods Explanation;Handout   Comprehension Verbalized understanding            SLP Long Term Goals - 10/22/16 1905      SLP LONG TERM GOAL #1   Title Pt will report adhering to reflux precautions over 2 sessions.    Time 4   Period Weeks   Status New     SLP LONG TERM GOAL #2   Title Pt will self-report improvements in swallowing function as measured on EAT-10.   Baseline 10/22/16: 28/40   Time 4   Period Weeks   Status New     SLP LONG TERM GOAL #3   Title Pt will report increased intake of solid foods with use of texture alterations and compensatory techniques for oral clearance.   Time 4   Period Weeks   Status New          Plan - 10/22/16 1300    Clinical Impression Statement Patient presents with oral dysphagia secondary to sensitive gag response. Observed  today with oral holding, suspected piecemealing and premature spillage as seen on recent MBS. Oral motor examination appears unremarkable today in limited assessment; will assess further in treatment. Pt limited today by nausea, was noted with audible, wet gagging; suspect regurgitation after single sips of thin liquids. Pt also reports increased anxiety around eating which is a likely contributing factor to his dysphagia. Question esophageal component given his history of GERD. He may benefit from referral to GI based on his symptoms and recent onset of nausea, which he reports began last night. Pt instructed to follow up with MD should nausea persist. Pt reports decreased quality of life, states he has lost 30 lbs in the past month. Recommend skilled ST to identify and train strategies to mitigate pt's symptoms in order to increase oral intake, improve swallowing function and pt's quality of life.   Speech Therapy Frequency 1x /week   Duration 4 weeks   Treatment/Interventions Compensatory strategies;Trials of upgraded texture/liquids;Patient/family education;Diet toleration management by SLP;Other (comment);Aspiration precaution training  feeding strategy training   Potential to Achieve Goals Good   SLP Home Exercise Plan reflux precautions   Consulted and Agree with Plan of Care Patient  brother      Patient will benefit from skilled  therapeutic intervention in order to improve the following deficits and impairments:   Dysphagia, oral phase      G-Codes - 2016-11-02 1911    Functional Assessment Tool Used NOMS   Functional Limitations Swallowing   Swallow Current Status (Z4827) At least 20 percent but less than 40 percent impaired, limited or restricted   Swallow Goal Status (M7867) At least 1 percent but less than 20 percent impaired, limited or restricted      Problem List Patient Active Problem List   Diagnosis Date Noted  . S/P AVR 09/15/2016  . Coronary artery disease involving native coronary artery of native heart without angina pectoris 04/29/2016  . Aortic insufficiency 04/01/2016  . Thoracic aortic aneurysm ([place]) 09/18/2015  . HTN (hypertension) 09/18/2015  . Hyperlipidemia 09/18/2015  . Asymptomatic bilateral carotid artery stenosis 08/27/2015    Aliene Altes November 02, 2016, 7:13 PM  [facility] [address] [place], [place], [address] Phone: [phone]   Fax:  [phone]  Name: Lonnie Hansen MRN: [id number] Date of Birth: 17-Feb-1950

## 2016-10-22 NOTE — Patient Instructions (Signed)
You may benefit from trying foods that require less chewing. Take small bites/sips and follow with sips of liquid. You can try things like:  -mashed potatoes (with extra milk or gravy for a smoother, more liquid consistency) -use a blender to make smoothies and purees   What is reflux? Gastroesophageal Reflux Disease (GERD) commonly referred to as reflux, is a backflow of acid from the stomach into the swallowing tube or esophagus.  Some reflux is normal, but when it happens frequently, the acid can irritate and damage the lining on the inside of the esophagus.  The most common symptom is heartburn.  Laryngopharyngeal Reflux (LPR) is when the acid backflow reaches the throat.  The structures of the throat (pharynx, larynx) are much more sensitive to stomach acid, so there is increased risk of damage.  People with LPR often do not experience heartburn.  The more common symptoms of LPR include: hoarseness, chronic cough, frequent throat clearing, feeling a lump in the throat and problems swallowing.  There are many changes you can make in diet, positioning and in your lifestyle that can have a dramatic effect in preventing or stopping reflux.  They include:  Everyday: . Avoid tight or constricting clothing . Avoid smoking, or exposing yourself to second hand smoke . Avoid non-steroidal anti-inflammatory drugs (ibuprofen, Alleve) . Exercise regularly, reduce stress . Lose weight   Avoiding or limiting certain foods: . Spicy, acidic (tomato-based foods, citrus or vinegar-based foods) . Fruit juices such as orange, grapefruit or cranberry . Fried foods . Caffeine (such as coffee, tea, sodas)   . Carbonated beverages  . Chocolate  . Peppermint . Alcohol . Decrease Dairy . Decrease red meat . Any food that gives you symptoms            During and after meals: . Eat slowly and don't overeat at meals . Eat several smaller meals a day, rather than larger ones . Do not lie down for  at least  hour- 1 hour after meals . Avoid bending over or exercising after eating . Chewing gum (non-mint) for 20 minutes after each meal may be helpful . Drinking warm fluids with meals (i.e. warm decaffeinated tea) may help clear the esophagus better  Bedtime: . Avoid eating or drinking within 2-3 hours before bedtime, except for water . Elevate the head of the bed 6-8 inches with blocks, books, or wedge under your mattress (propping  yourself up on pillows may cause neck or back pain) . If you take medications at night, be sure to take them with a full glass of water

## 2016-10-23 ENCOUNTER — Encounter (HOSPITAL_COMMUNITY): Payer: Self-pay | Admitting: Radiology

## 2016-10-23 ENCOUNTER — Other Ambulatory Visit (HOSPITAL_COMMUNITY): Payer: Self-pay | Admitting: Radiology

## 2016-10-23 ENCOUNTER — Ambulatory Visit (HOSPITAL_COMMUNITY)
Admission: RE | Admit: 2016-10-23 | Discharge: 2016-10-23 | Disposition: A | Discharge status: Home / Self Care | Payer: Medicare Other | Source: Ambulatory Visit | Attending: Radiology | Admitting: Radiology

## 2016-10-23 ENCOUNTER — Ambulatory Visit (HOSPITAL_COMMUNITY)
Admission: RE | Admit: 2016-10-23 | Discharge: 2016-10-23 | Disposition: A | Discharge status: Home / Self Care | Payer: Medicare Other | Source: Ambulatory Visit | Attending: Internal Medicine | Admitting: Internal Medicine

## 2016-10-23 DIAGNOSIS — J90 Pleural effusion, not elsewhere classified: Secondary | ICD-10-CM | POA: Insufficient documentation

## 2016-10-23 DIAGNOSIS — D649 Anemia, unspecified: Secondary | ICD-10-CM | POA: Diagnosis not present

## 2016-10-23 HISTORY — PX: IR THORACENTESIS ASP PLEURAL SPACE W/IMG GUIDE: IMG5380

## 2016-10-23 MED ORDER — LIDOCAINE HCL 1 % IJ SOLN
INTRAMUSCULAR | Status: AC
  Filled 2016-10-23: qty 20

## 2016-10-23 NOTE — Procedures (Signed)
   US guided right thoracentesis  2 Liter dark blood collected No labs per MDF  Post CXR: IMPRESSION: Resolution of right-sided pleural effusion. Right pneumothorax is noted related to incomplete re-expansion of the right middle and lower lobes. This does not represent a true pneumothorax. Continued follow-up is recommended.  Pt is asymptomatic Discussed with Dr Anselm Pancoast Informed Junie Panning Barrett PAC with Dr Servando Snare office (pt has appt with Dr Servando Snare Oct 4)  Also informed Dr [name] office.  Pt sent home--- to return if CP or increased cough

## 2016-10-26 ENCOUNTER — Other Ambulatory Visit: Payer: Self-pay | Admitting: Internal Medicine

## 2016-10-26 ENCOUNTER — Ambulatory Visit: Payer: Medicare Other | Attending: Internal Medicine | Admitting: Speech Pathology

## 2016-10-26 DIAGNOSIS — M25551 Pain in right hip: Secondary | ICD-10-CM

## 2016-10-26 DIAGNOSIS — R1311 Dysphagia, oral phase: Secondary | ICD-10-CM | POA: Insufficient documentation

## 2016-10-26 NOTE — Patient Instructions (Signed)
Continue attempting solid foods. Try purees, softer foods. You can moisten foods with extra sauce or gravy if this is helpful. Drink sips of water to help you clear foods if you are having trouble.  Keep a food log: record the foods you try this week, how much you were able to eat, and note if it was easy to swallow, or if you had difficulty. Bring with you to your next session.

## 2016-10-26 NOTE — Therapy (Signed)
[facility] [address] [place], [place], [address] Phone: [phone]   Fax:  [phone]  Speech Language Pathology Treatment  Patient Details  Name: Lonnie Hansen MRN: [id number] Date of Birth: 24-Jul-1950 Referring Provider: [name]   Encounter Date: 10/26/2016      End of Session - 10/26/16 1317    Visit Number 2   Number of Visits 5   Date for SLP Re-Evaluation 11/27/16   SLP Start Time 1025  pt arrived late   SLP Stop Time  1100   SLP Time Calculation (min) 35 min   Activity Tolerance Patient tolerated treatment well      Past Medical History:  Diagnosis Date  . Anemia   . Arthritis   . Asymptomatic bilateral carotid artery stenosis 08/2015   1-39%   . Blood dyscrasia    "trouble with my bloods clotting"  . Bruising    on the skin states due to platelets or sometimes low or high  . Cataracts, bilateral   . Coronary artery disease   . Diverticulosis   . Enlarged aorta ([place])   . Enlarged prostate    slightly  . GERD (gastroesophageal reflux disease)    takes Pantoprazole daily as needed  . Glaucoma    uses eye drops daily  . Headache   . History of colon polyps    benign  . History of kidney stones   . Hyperlipidemia    no on any meds  . Hypertension    takes Amlodipine and Atenolol daily  . Joint pain   . Joint swelling   . Nocturia   . Sleep apnea   . Vocal cord nodule    pt. states  it's a" growth on vocal cord"    Past Surgical History:  Procedure Laterality Date  . AORTIC ARCH ANGIOGRAPHY N/A 04/16/2016   Procedure: Aortic Arch Angiography;  Surgeon: [name] Martinique, MD;  Location: [facility];  Service: Cardiovascular;  Laterality: N/A;  . AORTIC VALVE REPLACEMENT N/A 09/15/2016   Procedure: AORTIC VALVE REPLACEMENT (AVR);  Surgeon: Grace Isaac, MD;  Location: [facility];  Service: Open Heart Surgery;  Laterality: N/A;  Using 14mm Edwards Perimount Magna Ease  Aortic Bioprosthesis Valve  . ASCENDING AORTIC ROOT REPLACEMENT N/A 09/15/2016   Procedure: ASCENDING AORTIC ROOT REPLACEMENT;  Surgeon: Grace Isaac, MD;  Location: [facility];  Service: Open Heart Surgery;  Laterality: N/A;  Using 68mm Gelweave Valsalva Graft  . COLONOSCOPY    . COLONOSCOPY WITH ESOPHAGOGASTRODUODENOSCOPY (EGD)    . IR THORACENTESIS ASP PLEURAL SPACE W/IMG GUIDE  10/23/2016  . MULTIPLE EXTRACTIONS WITH ALVEOLOPLASTY N/A 06/10/2016   Procedure: Extraction of tooth #'s 2,8,13,15, and 29  with alveoloplasty, maxillary right and left buccal exostoses reductions, and gross debridement of remaining teeth.;  Surgeon: Lenn Cal, DDS;  Location: [facility];  Service: Oral Surgery;  Laterality: N/A;  . RIGHT/LEFT HEART CATH AND CORONARY ANGIOGRAPHY N/A 04/16/2016   Procedure: Right/Left Heart Cath and Coronary Angiography;  Surgeon: [name] Martinique, MD;  Location: [facility];  Service: Cardiovascular;  Laterality: N/A;  . TEE WITHOUT CARDIOVERSION N/A 09/15/2016   Procedure: TRANSESOPHAGEAL ECHOCARDIOGRAM (TEE);  Surgeon: Grace Isaac, MD;  Location: [facility];  Service: Open Heart Surgery;  Laterality: N/A;    There were no vitals filed for this visit.      Subjective Assessment - 10/26/16 1027    Subjective "They took 2 liters of  fluid off my right lung"                ADULT SLP TREATMENT - 10/26/16 1015      General Information   Behavior/Cognition Alert;Cooperative;Pleasant mood   Patient Positioning Upright in chair   Oral care provided N/A     Treatment Provided   Treatment provided Dysphagia     Dysphagia Treatment   Temperature Spikes Noted No   Respiratory Status Room air   Oral Cavity - Dentition Adequate natural dentition   Treatment Methods Skilled observation;Upgraded PO texture trial;Compensation strategy training;Patient/caregiver education;Differential diagnosis   Patient observed directly with PO's Yes   Type of PO's observed  Regular;Dysphagia 3 (soft);Dysphagia 1 (puree);Thin liquids   Feeding Able to feed self   Liquids provided via Cup   Oral Phase Signs & Symptoms Prolonged mastication;Prolonged bolus formation;Oral holding   Pharyngeal Phase Signs & Symptoms Delayed cough;Complaints of globus  gagging   Type of cueing Verbal   Amount of cueing Minimal   Other treatment/comments Patient reports his nausea improved after "they took 2 liters of fluid off my right lung." He does report some improvement in his ability to take in solids, although he states mastication is effortful and "it takes me a long time to get it ready." Provided skilled observation of trials of thin liquids, pureed, soft and regular solids. Pt swallows consecutive sips of thin liquids with appearance of timely oral transit and swallow initiation, with no gagging or overt signs of aspiration. With teaspoons puree, pt presents with gagging after approximately 10 seconds of prolonged oral manipulation. SLP instructed pt to take 1/2 teaspoon size boluses with immediate liquid wash, which alleviated gagging and appeared to expedite oral transit. With soft and regular solids, pt able to masticate and clear small bites with extended time, no gagging observed. After pt had consumed approximately 6oz liquids, 2 oz solids, he presented with belching, endorsed globus sensation and requested time before taking any further PO. Provided education re: f/u with PCP and possibly GI given possible reflux which may be impacting swallow function.      Pain Assessment   Pain Assessment No/denies pain     Assessment / Recommendations / Plan   Plan Continue with current plan of care     Dysphagia Recommendations   Diet recommendations Regular;Thin liquid   Liquids provided via Cup   Medication Administration Whole meds with liquid   Supervision Patient able to self feed   Compensations Slow rate;Small sips/bites;Follow solids with liquid     Progression Toward  Goals   Progression toward goals Progressing toward goals          SLP Education - 10/26/16 1316    Education provided Yes   Education Details Follow up with PCP/GI re: how often to take protonics (pt is requesting whether he should take daily)   Person(s) Educated Patient  brother   Methods Explanation   Comprehension Verbalized understanding            SLP Long Term Goals - 10/26/16 1820      SLP LONG TERM GOAL #1   Title Pt will report adhering to reflux precautions over 2 sessions.   Time 4   Period Weeks   Status On-going     SLP LONG TERM GOAL #2   Title Pt will self-report improvements in swallowing function as measured on EAT-10.   Time 4   Period Weeks   Status On-going     SLP LONG  TERM GOAL #3   Title Pt will report increased intake of solid foods with use of texture alterations and compensatory techniques for oral clearance.   Time 4   Period Weeks   Status On-going          Plan - 10/26/16 1816    Clinical Impression Statement Patient presents with oral dysphagia secondary to sensitive gag response. Gag response is less severe today than during previous session, and pt benefitted from strategies to aid oral clearance including liquid wash and thorough mastication which significantly reduced gagging. Oral motor examination reveals no focal cranial nerve deficits. Question esophageal component given his history of GERD, delayed coughing and belching noted which increased after pt consumed larger volumes. He may benefit from referral to GI based on his symptoms. Pt instructed to follow up with MD re: his questions about protonics. Pt reports decreased quality of life, states he has lost 30 lbs in the past month. Recommend skilled ST to identify and train strategies to mitigate pt's symptoms in order to increase oral intake, improve swallowing function and pt's quality of life.   Speech Therapy Frequency 1x /week   Duration 4 weeks   Treatment/Interventions  Compensatory strategies;Trials of upgraded texture/liquids;Patient/family education;Diet toleration management by SLP;Other (comment);Aspiration precaution training   Potential to Achieve Goals Good   SLP Home Exercise Plan reflux precautions   Consulted and Agree with Plan of Care Patient      Patient will benefit from skilled therapeutic intervention in order to improve the following deficits and impairments:   Dysphagia, oral phase    Problem List Patient Active Problem List   Diagnosis Date Noted  . S/P AVR 09/15/2016  . Coronary artery disease involving native coronary artery of native heart without angina pectoris 04/29/2016  . Aortic insufficiency 04/01/2016  . Thoracic aortic aneurysm ([place]) 09/18/2015  . HTN (hypertension) 09/18/2015  . Hyperlipidemia 09/18/2015  . Asymptomatic bilateral carotid artery stenosis 08/27/2015   Deneise Lever, [place], [place] 10/26/2016, 6:21 PM  [facility] [address] [place], [place], [address] Phone: [phone]   Fax:  [phone]   Name: Lonnie Hansen MRN: [id number] Date of Birth: 1950-07-10

## 2016-10-27 DIAGNOSIS — I2510 Atherosclerotic heart disease of native coronary artery without angina pectoris: Secondary | ICD-10-CM | POA: Diagnosis not present

## 2016-10-27 DIAGNOSIS — I10 Essential (primary) hypertension: Secondary | ICD-10-CM | POA: Diagnosis not present

## 2016-10-27 DIAGNOSIS — Z48812 Encounter for surgical aftercare following surgery on the circulatory system: Secondary | ICD-10-CM | POA: Diagnosis not present

## 2016-10-27 DIAGNOSIS — H409 Unspecified glaucoma: Secondary | ICD-10-CM | POA: Diagnosis not present

## 2016-10-27 DIAGNOSIS — D649 Anemia, unspecified: Secondary | ICD-10-CM | POA: Diagnosis not present

## 2016-10-27 DIAGNOSIS — M1611 Unilateral primary osteoarthritis, right hip: Secondary | ICD-10-CM | POA: Diagnosis not present

## 2016-10-28 ENCOUNTER — Other Ambulatory Visit: Payer: Self-pay | Admitting: *Deleted

## 2016-10-28 DIAGNOSIS — Z952 Presence of prosthetic heart valve: Secondary | ICD-10-CM

## 2016-10-29 ENCOUNTER — Other Ambulatory Visit: Payer: Self-pay | Admitting: Surgical

## 2016-10-29 ENCOUNTER — Encounter: Payer: Self-pay | Admitting: Cardiothoracic Surgery

## 2016-10-29 ENCOUNTER — Other Ambulatory Visit: Payer: Self-pay | Admitting: Physician Assistant

## 2016-10-29 ENCOUNTER — Other Ambulatory Visit: Payer: Self-pay

## 2016-10-29 ENCOUNTER — Inpatient Hospital Stay (HOSPITAL_COMMUNITY)
Admission: AD | Admit: 2016-10-29 | Discharge: 2016-11-04 | DRG: 200 | Disposition: A | Discharge status: Home Health | Payer: Medicare Other | Source: Ambulatory Visit | Attending: Cardiothoracic Surgery | Admitting: Cardiothoracic Surgery

## 2016-10-29 ENCOUNTER — Ambulatory Visit (INDEPENDENT_AMBULATORY_CARE_PROVIDER_SITE_OTHER): Payer: Self-pay | Admitting: Cardiothoracic Surgery

## 2016-10-29 ENCOUNTER — Ambulatory Visit
Admission: RE | Admit: 2016-10-29 | Discharge: 2016-10-29 | Disposition: A | Discharge status: Home / Self Care | Payer: Medicare Other | Source: Ambulatory Visit | Attending: Cardiothoracic Surgery | Admitting: Cardiothoracic Surgery

## 2016-10-29 VITALS — BP 129/90 | HR 85 | Ht 69.0 in | Wt 178.0 lb

## 2016-10-29 DIAGNOSIS — Z952 Presence of prosthetic heart valve: Secondary | ICD-10-CM

## 2016-10-29 DIAGNOSIS — D649 Anemia, unspecified: Secondary | ICD-10-CM | POA: Diagnosis present

## 2016-10-29 DIAGNOSIS — H409 Unspecified glaucoma: Secondary | ICD-10-CM | POA: Diagnosis present

## 2016-10-29 DIAGNOSIS — Z9889 Other specified postprocedural states: Secondary | ICD-10-CM

## 2016-10-29 DIAGNOSIS — K219 Gastro-esophageal reflux disease without esophagitis: Secondary | ICD-10-CM | POA: Diagnosis present

## 2016-10-29 DIAGNOSIS — J939 Pneumothorax, unspecified: Principal | ICD-10-CM | POA: Diagnosis present

## 2016-10-29 DIAGNOSIS — J948 Other specified pleural conditions: Secondary | ICD-10-CM

## 2016-10-29 DIAGNOSIS — Z79899 Other long term (current) drug therapy: Secondary | ICD-10-CM | POA: Diagnosis not present

## 2016-10-29 DIAGNOSIS — I77810 Thoracic aortic ectasia: Secondary | ICD-10-CM

## 2016-10-29 DIAGNOSIS — I2510 Atherosclerotic heart disease of native coronary artery without angina pectoris: Secondary | ICD-10-CM | POA: Diagnosis present

## 2016-10-29 DIAGNOSIS — I459 Conduction disorder, unspecified: Secondary | ICD-10-CM | POA: Diagnosis present

## 2016-10-29 DIAGNOSIS — Z4682 Encounter for fitting and adjustment of non-vascular catheter: Secondary | ICD-10-CM

## 2016-10-29 DIAGNOSIS — G4730 Sleep apnea, unspecified: Secondary | ICD-10-CM | POA: Diagnosis present

## 2016-10-29 DIAGNOSIS — D696 Thrombocytopenia, unspecified: Secondary | ICD-10-CM | POA: Diagnosis present

## 2016-10-29 DIAGNOSIS — N400 Enlarged prostate without lower urinary tract symptoms: Secondary | ICD-10-CM | POA: Diagnosis present

## 2016-10-29 DIAGNOSIS — I6523 Occlusion and stenosis of bilateral carotid arteries: Secondary | ICD-10-CM | POA: Diagnosis present

## 2016-10-29 DIAGNOSIS — I351 Nonrheumatic aortic (valve) insufficiency: Secondary | ICD-10-CM

## 2016-10-29 DIAGNOSIS — Z87891 Personal history of nicotine dependence: Secondary | ICD-10-CM | POA: Diagnosis not present

## 2016-10-29 DIAGNOSIS — J90 Pleural effusion, not elsewhere classified: Secondary | ICD-10-CM | POA: Diagnosis present

## 2016-10-29 DIAGNOSIS — I10 Essential (primary) hypertension: Secondary | ICD-10-CM | POA: Diagnosis present

## 2016-10-29 DIAGNOSIS — Z09 Encounter for follow-up examination after completed treatment for conditions other than malignant neoplasm: Secondary | ICD-10-CM

## 2016-10-29 DIAGNOSIS — R0602 Shortness of breath: Secondary | ICD-10-CM

## 2016-10-29 DIAGNOSIS — Z9689 Presence of other specified functional implants: Secondary | ICD-10-CM

## 2016-10-29 DIAGNOSIS — E785 Hyperlipidemia, unspecified: Secondary | ICD-10-CM | POA: Diagnosis present

## 2016-10-29 DIAGNOSIS — J9311 Primary spontaneous pneumothorax: Secondary | ICD-10-CM

## 2016-10-29 DIAGNOSIS — J9383 Other pneumothorax: Secondary | ICD-10-CM

## 2016-10-29 LAB — COMPREHENSIVE METABOLIC PANEL
ALBUMIN: 3.5 g/dL (ref 3.5–5.0)
ALK PHOS: 88 U/L (ref 38–126)
ALT: 32 U/L (ref 17–63)
ANION GAP: 12 (ref 5–15)
AST: 30 U/L (ref 15–41)
BUN: 9 mg/dL (ref 6–20)
CALCIUM: 9.4 mg/dL (ref 8.9–10.3)
CHLORIDE: 95 mmol/L — AB (ref 101–111)
CO2: 27 mmol/L (ref 22–32)
Creatinine, Ser: 0.99 mg/dL (ref 0.61–1.24)
GFR calc non Af Amer: >60 mL/min (ref >60)
GLUCOSE: 97 mg/dL (ref 65–99)
POTASSIUM: 4.4 mmol/L (ref 3.5–5.1)
SODIUM: 134 mmol/L — AB (ref 135–145)
Total Bilirubin: 0.8 mg/dL (ref 0.3–1.2)
Total Protein: 7.5 g/dL (ref 6.5–8.1)

## 2016-10-29 LAB — CBC
HCT: 35.4 % — ABNORMAL LOW (ref 39.0–52.0)
Hemoglobin: 11.5 g/dL — ABNORMAL LOW (ref 13.0–17.0)
MCH: 28.3 pg (ref 26.0–34.0)
MCHC: 32.5 g/dL (ref 30.0–36.0)
MCV: 87.2 fL (ref 78.0–100.0)
PLATELETS: 158 10*3/uL (ref 150–400)
RBC: 4.06 MIL/uL — ABNORMAL LOW (ref 4.22–5.81)
RDW: 16 % — AB (ref 11.5–15.5)
WBC: 11.5 10*3/uL — ABNORMAL HIGH (ref 4.0–10.5)

## 2016-10-29 LAB — MRSA PCR SCREENING: MRSA by PCR: NEGATIVE

## 2016-10-29 MED ORDER — ACETAMINOPHEN 650 MG RE SUPP: 650.0000 mg | Freq: Four times a day (QID) | RECTAL | Status: DC | PRN

## 2016-10-29 MED ORDER — ASPIRIN EC 81 MG PO TBEC
81.0000 mg | DELAYED_RELEASE_TABLET | Freq: Every day | ORAL | Status: DC
  Administered 2016-10-29 – 2016-11-04 (×7): 81 mg via ORAL
  Filled 2016-10-29 (×7): qty 1

## 2016-10-29 MED ORDER — ONDANSETRON HCL 4 MG PO TABS: 4.0000 mg | ORAL_TABLET | Freq: Four times a day (QID) | ORAL | Status: DC | PRN

## 2016-10-29 MED ORDER — PANTOPRAZOLE SODIUM 40 MG PO TBEC
40.0000 mg | DELAYED_RELEASE_TABLET | Freq: Every day | ORAL | Status: DC
  Administered 2016-10-29 – 2016-11-04 (×7): 40 mg via ORAL
  Filled 2016-10-29 (×7): qty 1

## 2016-10-29 MED ORDER — SODIUM CHLORIDE 0.9 % IV SOLN: 250.0000 mL | INTRAVENOUS | Status: DC | PRN

## 2016-10-29 MED ORDER — LEVALBUTEROL HCL 0.63 MG/3ML IN NEBU: 0.6300 mg | INHALATION_SOLUTION | Freq: Four times a day (QID) | RESPIRATORY_TRACT | Status: DC | PRN

## 2016-10-29 MED ORDER — ATENOLOL 25 MG PO TABS
12.5000 mg | ORAL_TABLET | Freq: Two times a day (BID) | ORAL | Status: DC
  Administered 2016-10-29 – 2016-11-04 (×11): 12.5 mg via ORAL
  Filled 2016-10-29 (×12): qty 1

## 2016-10-29 MED ORDER — AMIODARONE HCL 200 MG PO TABS
200.0000 mg | ORAL_TABLET | Freq: Two times a day (BID) | ORAL | Status: DC
  Filled 2016-10-29: qty 1

## 2016-10-29 MED ORDER — TRAMADOL HCL 50 MG PO TABS
50.0000 mg | ORAL_TABLET | Freq: Four times a day (QID) | ORAL | Status: DC | PRN
  Administered 2016-11-01 – 2016-11-03 (×3): 50 mg via ORAL
  Filled 2016-10-29 (×5): qty 1

## 2016-10-29 MED ORDER — AMLODIPINE BESYLATE 10 MG PO TABS
10.0000 mg | ORAL_TABLET | Freq: Every day | ORAL | Status: DC
  Administered 2016-10-31 – 2016-11-04 (×5): 10 mg via ORAL
  Filled 2016-10-29 (×6): qty 1

## 2016-10-29 MED ORDER — LATANOPROST 0.005 % OP SOLN
1.0000 [drp] | Freq: Every day | OPHTHALMIC | Status: DC
  Administered 2016-10-29 – 2016-11-03 (×6): 1 [drp] via OPHTHALMIC
  Filled 2016-10-29: qty 2.5

## 2016-10-29 MED ORDER — ONDANSETRON HCL 4 MG/2ML IJ SOLN: 4.0000 mg | Freq: Four times a day (QID) | INTRAMUSCULAR | Status: DC | PRN

## 2016-10-29 MED ORDER — ATENOLOL 25 MG PO TABS: 12.5000 mg | ORAL_TABLET | Freq: Two times a day (BID) | ORAL | Status: DC

## 2016-10-29 MED ORDER — DOCUSATE SODIUM 100 MG PO CAPS
100.0000 mg | ORAL_CAPSULE | Freq: Two times a day (BID) | ORAL | Status: DC
  Administered 2016-10-30 – 2016-11-04 (×10): 100 mg via ORAL
  Filled 2016-10-29 (×12): qty 1

## 2016-10-29 MED ORDER — ENOXAPARIN SODIUM 40 MG/0.4ML ~~LOC~~ SOLN
40.0000 mg | SUBCUTANEOUS | Status: DC
  Administered 2016-10-30 – 2016-11-03 (×4): 40 mg via SUBCUTANEOUS
  Filled 2016-10-29 (×4): qty 0.4

## 2016-10-29 MED ORDER — SODIUM CHLORIDE 0.9% FLUSH
3.0000 mL | INTRAVENOUS | Status: DC | PRN
  Administered 2016-10-30: 3 mL via INTRAVENOUS
  Filled 2016-10-29: qty 3

## 2016-10-29 MED ORDER — FUROSEMIDE 20 MG PO TABS
20.0000 mg | ORAL_TABLET | Freq: Every day | ORAL | Status: DC
  Administered 2016-10-30 – 2016-11-04 (×6): 20 mg via ORAL
  Filled 2016-10-29 (×6): qty 1

## 2016-10-29 MED ORDER — SODIUM CHLORIDE 0.9% FLUSH
3.0000 mL | Freq: Two times a day (BID) | INTRAVENOUS | Status: DC
Start: 2016-10-29 — End: 2016-11-04
  Administered 2016-10-29 – 2016-11-04 (×9): 3 mL via INTRAVENOUS

## 2016-10-29 MED ORDER — ACETAMINOPHEN 325 MG PO TABS: 650.0000 mg | ORAL_TABLET | Freq: Four times a day (QID) | ORAL | Status: DC | PRN

## 2016-10-29 MED ORDER — ATORVASTATIN CALCIUM 40 MG PO TABS
40.0000 mg | ORAL_TABLET | Freq: Every day | ORAL | Status: DC
  Filled 2016-10-29: qty 1

## 2016-10-29 NOTE — Progress Notes (Signed)
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 PROCEDURES:  Biologic Bentall with replacement of aortic valve with [employer] pericardial tissue valve, model 3300TFX 29 mm, serial #[id number], and replacement of the aortic root and ascending aorta with a 32 mm Gelweave Valsalva graft and reimplantation of the right and left coronary buttons. SURGEON: Grace Isaac MD  History of Present Illness:    Patient returns office today for follow-up appointment after modified biologic Bentall done August 21. Since surgery the patient had a thoracentesis done on the right, resulting in a right pneumothorax. He returns today with a follow-up x-ray that shows increasing pleural effusion and persistent right pneumothorax. The patient is mildly short of breath but in no distress. He also notes that he's had trouble swallowing since surgery a formal swallowing evaluation was performed without abnormality he has been seen in speech pathology rehabilitation.,    Past Medical History:  Diagnosis Date  . Anemia   . Arthritis   . Asymptomatic bilateral carotid artery stenosis 08/2015   1-39%   . Blood dyscrasia    "trouble with my bloods clotting"  . Bruising    on the skin states due to platelets or sometimes low or high  . Cataracts, bilateral   . Coronary artery disease   . Diverticulosis   . Enlarged aorta ([place])   . Enlarged prostate    slightly  . GERD (gastroesophageal reflux disease)    takes Pantoprazole daily as needed  . Glaucoma    uses  eye drops daily  . Headache   . History of colon polyps    benign  . History of kidney stones   . Hyperlipidemia    no on any meds  . Hypertension    takes Amlodipine and Atenolol daily  . Joint pain   . Joint swelling   . Nocturia   . Sleep apnea   . Vocal cord nodule    pt. states  it's a" growth on vocal cord"     History  Smoking Status  . Former Smoker  . Packs/day: 1.00  . Years: 25.00  . Types: Cigarettes  Smokeless Tobacco  . Never Used    History  Alcohol Use No     Allergies  Allergen Reactions  . No Known Allergies     Current Outpatient Prescriptions  Medication Sig Dispense Refill  . amiodarone (PACERONE) 200 MG tablet Take 1 tablet (200 mg total) by mouth 2 (two) times daily after a meal. 60 tablet 1  . amLODipine (NORVASC) 10 MG tablet Take 10 mg by mouth daily.    Marland Kitchen aspirin EC 81 MG EC tablet Take 1 tablet (81 mg total) by mouth daily.    . Aspirin-Acetaminophen (GOODY BODY PAIN) 500-325 MG PACK Take 1 Package by mouth daily as needed (for pain).    Marland Kitchen atenolol (TENORMIN) 25 MG tablet Take 0.5 tablets (12.5 mg total) by mouth 2 (two) times daily. 30 tablet 1  . folic acid (FOLVITE) 1 MG tablet  Take 1 tablet (1 mg total) by mouth daily. 30 tablet 1  . furosemide (LASIX) 20 MG tablet Take 1 tablet (20 mg total) by mouth daily. 30 tablet 3  . latanoprost (XALATAN) 0.005 % ophthalmic solution Place 1 drop into both eyes at bedtime.    . Multiple Vitamin (MULTIVITAMIN WITH MINERALS) TABS tablet Take 1 tablet by mouth daily.    . pantoprazole (PROTONIX) 40 MG tablet Take 20 mg by mouth daily as needed (indigestion).     . traMADol (ULTRAM) 50 MG tablet Take 1 tablet (50 mg total) by mouth every 6 (six) hours as needed for moderate pain. 30 tablet 0  . atorvastatin (LIPITOR) 40 MG tablet Take 1 tablet (40 mg total) by mouth daily. (Patient not taking: Reported on 10/29/2016) 90 tablet 3  . Ferrous Sulfate (IRON) 325 (65 Fe) MG TABS Take 1 tablet (325 mg  total) by mouth 2 (two) times daily with a meal. (Patient not taking: Reported on 10/29/2016) 60 each 1   No current facility-administered medications for this visit.        Physical Exam: BP 129/90   Pulse 85   Ht 5\' 9"  (1.753 m)   Wt 178 lb (80.7 kg)   SpO2 96% Comment: RA  BMI 26.29 kg/m   General appearance: alert and cooperative Neurologic: intact Heart: regular rate and rhythm, S1, S2 normal, no murmur, click, rub or gallop Lungs: diminished breath sounds RLL and RML Abdomen: soft, non-tender; bowel sounds normal; no masses,  no organomegaly Extremities: extremities normal, atraumatic, no cyanosis or edema and Homans sign is negative, no sign of DVT Wound: sternum stable   Diagnostic Studies & Laboratory data:     Recent Radiology Findings:   Dg Chest 2 View  Result Date: 10/29/2016 CLINICAL DATA:  Shortness of breath.  Prior pneumothorax. EXAM: CHEST  2 VIEW COMPARISON:  August 22, 2016 FINDINGS: There is again noted a pneumothorax on the right which appears marginally larger compared to previous study. There is a new moderate pleural effusion on the right. There may be a minimal tension component with each equivocal shift of heart and mediastinum toward the left, stable. Left lung is clear. The heart size is normal. Pulmonary vascularity on the left is within normal limits. The pulmonary vascularity on the right is mildly distorted due to the pneumothorax. There is an aortic valve replacement. The aorta is prominent but stable. No bone lesions. No adenopathy. IMPRESSION: Right-sided pneumothorax marginally larger. Moderate pleural effusion now present on the right. Question mild tension component. Left lung clear. Stable cardiac silhouette. Prominence of the aorta is likely reflective of aortic valve disease. There is an aortic valve replacement. Critical Value/emergent results were called by telephone at the time of interpretation on 10/29/2016 at 12:28 pm to Dr. Lanelle Bal ,  who verbally acknowledged these results. Electronically Signed   By: Lowella Grip III M.D.   On: 10/29/2016 12:28      Recent Lab Findings: Lab Results  Component Value Date   WBC 11.4 (H) 10/14/2016   HGB 10.3 (L) 10/14/2016   HCT 33.1 (L) 10/14/2016   PLT 190 10/14/2016   GLUCOSE 102 (H) 10/14/2016   CHOL 135 10/07/2016   TRIG 75 10/07/2016   HDL 34 (L) 10/07/2016   LDLCALC 86 10/07/2016   ALT 63 10/14/2016   AST 42 (H) 10/14/2016   NA 133 (L) 10/14/2016   K 4.1 10/14/2016   CL 98 (L) 10/14/2016   CREATININE 1.05 10/14/2016  BUN 11 10/14/2016   CO2 20 (L) 10/14/2016   TSH 1.611 04/28/2010   INR 1.11 09/17/2016   HGBA1C 5.3 09/11/2016   Result status: Final result                           *[facility]*                        [address], [place] [address]                            [phone]  ------------------------------------------------------------------- Transthoracic Echocardiography  Patient:    Thierno, Hun MR #:       [id number] Study Date: 10/09/2016 Gender:     M Age:        66 Height:     175.3 cm Weight:     90.1 kg BSA:        2.12 m^2 Pt. Status: Room:   SONOGRAPHER  Diamond Nickel  PERFORMING   [place], Outpatient  Joanna Hews [phone]  [place] [phone]  Garrett, MD  cc:  ------------------------------------------------------------------- LV EF: 60% -   65%  ------------------------------------------------------------------- Indications:      I31.3 Pericardial effusion.  ------------------------------------------------------------------- History:   PMH:  Aortic valve replacement. Aortic root dilatation. Sleep apnea.  Syncope.  Coronary artery disease.  Risk factors: Hypertension. Dyslipidemia.  ------------------------------------------------------------------- Study Conclusions  - Left ventricle: The cavity size was normal.  Wall thickness was   increased in a pattern of mild LVH. Systolic function was normal.   The estimated ejection fraction was in the range of 60% to 65%. - Aortic valve: AV prosthesis appears to open well Peak and mean   gradients through the valve are 9 and 5 mm Hg respectively. - Mitral valve: Calcified annulus. Mildly thickened leaflets .   There was mild regurgitation. - Pulmonary arteries: PA peak pressure: 37 mm Hg (S). - Pericardium, extracardiac: A trivial pericardial effusion was   identified.  ------------------------------------------------------------------- Study data:  Comparison was made to the study of 09/16/2016.  Study status:  Routine.  Procedure:  Transthoracic echocardiography. Image quality was adequate.  Study completion:  There were no complications.          Transthoracic echocardiography.  M-mode, complete 2D, spectral Doppler, and color Doppler.  Birthdate: Patient birthdate: 1950/11/07.  Age:  Patient is 66 yr old.  Sex: Gender: male.    BMI: 29.3 kg/m^2.  Blood pressure:     114/81 Patient status:  Outpatient.  Study date:  Study date: 10/09/2016. Study time: 01:03 PM.  Location:  [facility]  -------------------------------------------------------------------  ------------------------------------------------------------------- Left ventricle:  Difficult acoustic windows LVEF is normal at approximately 60 % with mossible mild distal inferior hypokeinesis. The cavity size was normal. Wall thickness was increased in a pattern of mild LVH. Systolic function was normal. The estimated ejection fraction was in the range of 60% to 65%.  ------------------------------------------------------------------- Aortic valve:  AV prosthesis appears to open well Peak and mean gradients through the valve are 9 and 5 mm Hg respectively.  Mildly thickened leaflets.  Doppler:  There was no regurgitation.    VTI ratio  of LVOT to aortic valve: 0.47. Peak velocity  ratio of LVOT to aortic valve: 0.47. Mean velocity ratio of LVOT to aortic valve: 0.46.    Mean gradient (S): 5 mm Hg. Peak gradient (S): 9 mm Hg.   ------------------------------------------------------------------- Aorta:  Aortic root dilated at 43 mm Ascending aorta is dilated at 45 mm.  ------------------------------------------------------------------- Mitral valve:   Calcified annulus. Mildly thickened leaflets . Leaflet separation was normal.  Doppler:  Transvalvular velocity was within the normal range. There was no evidence for stenosis. There was mild regurgitation.    Peak gradient (D): 2 mm Hg.  ------------------------------------------------------------------- Left atrium:  The atrium was normal in size.  ------------------------------------------------------------------- Right ventricle:  The cavity size was normal. Wall thickness was normal. Systolic function was normal.  ------------------------------------------------------------------- Pulmonic valve:    Structurally normal valve.   Cusp separation was normal.  Doppler:  Transvalvular velocity was within the normal range. There was no regurgitation.  ------------------------------------------------------------------- Tricuspid valve:   Structurally normal valve.   Leaflet separation was normal.  Doppler:  Transvalvular velocity was within the normal range. There was trivial regurgitation.  ------------------------------------------------------------------- Right atrium:  The atrium was normal in size.  ------------------------------------------------------------------- Pericardium:  A trivial pericardial effusion was identified.  ------------------------------------------------------------------- Systemic veins: Inferior vena cava: The vessel was normal in size. The respirophasic diameter changes were in the normal range (= 50%), consistent with normal central venous  pressure.  ------------------------------------------------------------------- Post procedure conclusions Ascending Aorta:  - Aortic root dilated at 43 mm Ascending aorta is dilated at 45 mm.  ------------------------------------------------------------------- Measurements   Left ventricle                         Value        Reference  LV ID, ED, PLAX chordal                46.1  mm     43 - 52  LV ID, ES, PLAX chordal                37.2  mm     23 - 38  LV fx shortening, PLAX chordal (L)     19    %      >=29  LV PW thickness, ED                    14.7  mm     ---------  IVS/LV PW ratio, ED                    0.86         <=1.3  LV e&', lateral                         9.87  cm/s   ---------  LV E/e&', lateral                       7.95         ---------  LV e&', medial                          10.6  cm/s   ---------  LV E/e&', medial                        7.41         ---------  LV e&',  average                         10.24 cm/s   ---------  LV E/e&', average                       7.67         ---------    Ventricular septum                     Value        Reference  IVS thickness, ED                      12.7  mm     ---------    LVOT                                   Value        Reference  LVOT peak velocity, S                  70.6  cm/s   ---------  LVOT mean velocity, S                  49.9  cm/s   ---------  LVOT VTI, S                            13.5  cm     ---------  LVOT peak gradient, S                  2     mm Hg  ---------    Aortic valve                           Value        Reference  Aortic valve peak velocity, S          150   cm/s   ---------  Aortic valve mean velocity, S          108   cm/s   ---------  Aortic valve VTI, S                    29    cm     ---------  Aortic mean gradient, S                5     mm Hg  ---------  Aortic peak gradient, S                9     mm Hg  ---------  VTI ratio, LVOT/AV                     0.47          ---------  Velocity ratio, peak, LVOT/AV          0.47         ---------  Velocity ratio, mean, LVOT/AV          0.46         ---------    Left atrium                            Value  Reference  LA ID, A-P, ES                         33    mm     ---------  LA ID/bsa, A-P                         1.56  cm/m^2 <=2.2  LA volume, ES, 1-p A4C                 38    ml     ---------  LA volume/bsa, ES, 1-p A4C             18    ml/m^2 ---------    Mitral valve                           Value        Reference  Mitral E-wave peak velocity            78.5  cm/s   ---------  Mitral A-wave peak velocity            51.8  cm/s   ---------  Mitral deceleration time       (L)     130   ms     150 - 230  Mitral peak gradient, D                2     mm Hg  ---------  Mitral E/A ratio, peak                 1.5          ---------    Pulmonary arteries                     Value        Reference  PA pressure, S, DP             (H)     37    mm Hg  <=30    Tricuspid valve                        Value        Reference  Tricuspid regurg peak velocity         270   cm/s   ---------  Tricuspid peak RV-RA gradient          29    mm Hg  ---------    Systemic veins                         Value        Reference  Estimated CVP                          8     mm Hg  ---------    Right ventricle                        Value        Reference  RV pressure, S, DP             (H)     37    mm Hg  <=30  Legend: (L)  and  (H)  mark values outside specified reference range.  -------------------------------------------------------------------  Prepared and Electronically Authenticated by  Dorris Carnes, M.D. 2018-09-16T20:23:22      Assessment / Plan:      Patient status post Bentall with biologic valve, now presents to the office with a increasing right pleural effusion and pneumothorax. Primary care had arranged a thoracentesis on 928 following this the patient had pneumothorax was discharged home. He  returns for his regular follow-up appointment today. We'll plan on admitting the patient have IR place a pigtail catheter into the right chest to drain fluid and air While the patient is in the hospital will also have speech pathology returned to see him      Grace Isaac MD      [address] [place], [address] Office [phone]   Beeper [phone]  10/29/2016 1:14 PM

## 2016-10-30 ENCOUNTER — Inpatient Hospital Stay (HOSPITAL_COMMUNITY): Payer: Medicare Other

## 2016-10-30 ENCOUNTER — Encounter (HOSPITAL_COMMUNITY): Payer: Self-pay

## 2016-10-30 LAB — PROTIME-INR
INR: 1.15
Prothrombin Time: 14.6 seconds (ref 11.4–15.2)

## 2016-10-30 LAB — APTT: APTT: 31 s (ref 24–36)

## 2016-10-30 LAB — BRAIN NATRIURETIC PEPTIDE: B Natriuretic Peptide: 138.4 pg/mL — ABNORMAL HIGH (ref 0.0–100.0)

## 2016-10-30 MED ORDER — MIDAZOLAM HCL 2 MG/2ML IJ SOLN
INTRAMUSCULAR | Status: AC
  Filled 2016-10-30: qty 4

## 2016-10-30 MED ORDER — FENTANYL CITRATE (PF) 100 MCG/2ML IJ SOLN
INTRAMUSCULAR | Status: AC | PRN
  Administered 2016-10-30: 50 ug via INTRAVENOUS

## 2016-10-30 MED ORDER — MIDAZOLAM HCL 2 MG/2ML IJ SOLN
INTRAMUSCULAR | Status: AC | PRN
  Administered 2016-10-30: 1 mg via INTRAVENOUS

## 2016-10-30 MED ORDER — LIDOCAINE HCL 1 % IJ SOLN
INTRAMUSCULAR | Status: AC
  Filled 2016-10-30: qty 20

## 2016-10-30 MED ORDER — SODIUM CHLORIDE 0.9% FLUSH
5.0000 mL | Freq: Three times a day (TID) | INTRAVENOUS | Status: DC
  Administered 2016-10-30 – 2016-11-02 (×3): 5 mL via INTRAVENOUS

## 2016-10-30 MED ORDER — FENTANYL CITRATE (PF) 100 MCG/2ML IJ SOLN
INTRAMUSCULAR | Status: AC
  Filled 2016-10-30: qty 4

## 2016-10-30 NOTE — Consult Note (Signed)
Chief Complaint: Patient was seen in consultation today for chest tube drainplacement at the request of Dr Festus Barren  Referring Physician(s): Dr Festus Barren  Supervising Physician: Daryll Brod  Patient Status: [facility] - In-pt  History of Present Illness: Lonnie Hansen is a 66 y.o. male   Dr Servando Snare note 10/4: Patient returns office today for follow-up appointment after modified biologic Bent[place] done August 21. Since surgery the patient had a thoracentesis done on the right, resulting in a right pneumothorax. He returns today with a follow-up x-ray that shows increasing pleural effusion and persistent right pneumothorax. The patient is mildly short of breath but in no distress.  Admitted last pm per Dr Servando Snare Request for right chest tube drain placement Dr Annamaria Boots approves procedure  Past Medical History:  Diagnosis Date  . Anemia   . Arthritis   . Asymptomatic bilateral carotid artery stenosis 08/2015   1-39%   . Blood dyscrasia    "trouble with my bloods clotting"  . Bruising    on the skin states due to platelets or sometimes low or high  . Cataracts, bilateral   . Coronary artery disease   . Diverticulosis   . Enlarged aorta ([place])   . Enlarged prostate    slightly  . GERD (gastroesophageal reflux disease)    takes Pantoprazole daily as needed  . Glaucoma    uses eye drops daily  . Headache   . History of colon polyps    benign  . History of kidney stones   . Hyperlipidemia    no on any meds  . Hypertension    takes Amlodipine and Atenolol daily  . Joint pain   . Joint swelling   . Nocturia   . Sleep apnea   . Vocal cord nodule    pt. states  it's a" growth on vocal cord"    Past Surgical History:  Procedure Laterality Date  . AORTIC ARCH ANGIOGRAPHY N/A 04/16/2016   Procedure: Aortic Arch Angiography;  Surgeon: [name] Martinique, MD;  Location: [place] [facility];  Service: Cardiovascular;  Laterality: N/A;  . AORTIC VALVE REPLACEMENT N/A 09/15/2016   Procedure: AORTIC VALVE REPLACEMENT (AVR);  Surgeon: Grace Isaac, MD;  Location: [place];  Service: Open Heart Surgery;  Laterality: N/A;  Using 57mm Edwards Perimount Magna Ease Aortic Bioprosthesis Valve  . ASCENDING AORTIC ROOT REPLACEMENT N/A 09/15/2016   Procedure: ASCENDING AORTIC ROOT REPLACEMENT;  Surgeon: Grace Isaac, MD;  Location: [place];  Service: Open Heart Surgery;  Laterality: N/A;  Using 65mm Gelweave Valsalva Graft  . COLONOSCOPY    . COLONOSCOPY WITH ESOPHAGOGASTRODUODENOSCOPY (EGD)    . IR THORACENTESIS ASP PLEURAL SPACE W/IMG GUIDE  10/23/2016  . MULTIPLE EXTRACTIONS WITH ALVEOLOPLASTY N/A 06/10/2016   Procedure: Extraction of tooth #'s 2,8,13,15, and 29  with alveoloplasty, maxillary right and left buccal exostoses reductions, and gross debridement of remaining teeth.;  Surgeon: Lenn Cal, DDS;  Location: [place];  Service: Oral Surgery;  Laterality: N/A;  . RIGHT/LEFT HEART CATH AND CORONARY ANGIOGRAPHY N/A 04/16/2016   Procedure: Right/Left Heart Cath and Coronary Angiography;  Surgeon: [name] Martinique, MD;  Location: [place] [facility];  Service: Cardiovascular;  Laterality: N/A;  . TEE WITHOUT CARDIOVERSION N/A 09/15/2016   Procedure: TRANSESOPHAGEAL ECHOCARDIOGRAM (TEE);  Surgeon: Grace Isaac, MD;  Location: [place];  Service: Open Heart Surgery;  Laterality: N/A;    Allergies: No known allergies  Medications: Prior to Admission medications   Medication  Sig Start Date End Date Taking? Authorizing Provider  amiodarone (PACERONE) 200 MG tablet Take 1 tablet (200 mg total) by mouth 2 (two) times daily after a meal. 09/23/16   Gold, Wayne E, PA-C  amLODipine (NORVASC) 10 MG tablet Take 10 mg by mouth daily.    [provider]  aspirin EC 81 MG EC tablet Take 1 tablet (81 mg total) by mouth daily. 09/24/16   Gold, Wilder Glade, PA-C  Aspirin-Acetaminophen (GOODY BODY PAIN) 500-325 MG PACK Take 1 Package by mouth daily as needed (for pain).    [provider]  atenolol (TENORMIN) 25 MG tablet Take 0.5 tablets (12.5 mg total) by mouth 2 (two) times daily. 09/23/16   Gold, Wayne E, PA-C  atorvastatin (LIPITOR) 40 MG tablet Take 1 tablet (40 mg total) by mouth daily. Patient not taking: Reported on 10/29/2016 10/06/16 01/04/17  Almyra Deforest, PA  Ferrous Sulfate (IRON) 325 (65 Fe) MG TABS Take 1 tablet (325 mg total) by mouth 2 (two) times daily with a meal. Patient not taking: Reported on 10/29/2016 10/02/16   Grace Isaac, MD  folic acid (FOLVITE) 1 MG tablet Take 1 tablet (1 mg total) by mouth daily. 09/24/16   Jadene Pierini E, PA-C  furosemide (LASIX) 20 MG tablet Take 1 tablet (20 mg total) by mouth daily. 10/06/16 01/04/17  Almyra Deforest, PA  latanoprost (XALATAN) 0.005 % ophthalmic solution Place 1 drop into both eyes at bedtime.    [provider]  Multiple Vitamin (MULTIVITAMIN WITH MINERALS) TABS tablet Take 1 tablet by mouth daily.    [provider]  pantoprazole (PROTONIX) 40 MG tablet Take 20 mg by mouth daily as needed (indigestion).     [provider]  traMADol (ULTRAM) 50 MG tablet Take 1 tablet (50 mg total) by mouth every 6 (six) hours as needed for moderate pain. 09/23/16   [name], PA-C     Family History  Problem Relation Age of Onset  . Heart disease Father   . Heart attack Father   . Thyroid disease Sister     Social History   Social History  . Marital status: Married    Spouse name: N/A  . Number of children: 1  . Years of education: N/A   Occupational History  . Musician    Social History Main Topics  . Smoking status: Former Smoker    Packs/day: 1.00    Years: 25.00    Types: Cigarettes  . Smokeless tobacco: Never Used  . Alcohol use No  . Drug use: No  . Sexual activity: Not on file   Other Topics Concern  . Not on file   Social History Narrative   Married.   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    Review of Systems: A 12 point ROS discussed and pertinent  positives are indicated in the HPI above.  All other systems are negative.  Review of Systems  Constitutional: Positive for fatigue. Negative for activity change and fever.  Respiratory: Negative for cough and shortness of breath.   Cardiovascular: Negative for chest pain.  Neurological: Positive for weakness.  Psychiatric/Behavioral: Negative for behavioral problems and confusion.    Vital Signs: BP 117/81 (BP Location: Right Arm)   Pulse 73   Temp 98.9 F (37.2 C) (Oral)   Resp 15   Ht 5\' 9"  (1.753 m)   Wt 182 lb 15.7 oz (83 kg)   SpO2 98%   BMI  27.02 kg/m   Physical Exam  Constitutional: He is oriented to person, place, and time.  Cardiovascular: Normal rate, regular rhythm and normal heart sounds.   Pulmonary/Chest: Effort normal. He has wheezes.  Abdominal: Soft. Bowel sounds are normal.  Musculoskeletal: Normal range of motion.  Neurological: He is alert and oriented to person, place, and time.  Skin: Skin is warm and dry.  Psychiatric: He has a normal mood and affect. His behavior is normal. Judgment and thought content normal.  Nursing note and vitals reviewed.   Imaging: Dg Chest 1 View  Result Date: 10/23/2016 CLINICAL DATA:  Status post right thoracentesis EXAM: CHEST 1 VIEW COMPARISON:  10/14/2016 FINDINGS: Cardiac shadow is within normal limits. Postsurgical changes are noted. Significant reduction in right-sided pleural effusion is noted. There is incomplete re-expansion of the right middle and lower lobes identified. This is likely related to longstanding compression from the pleural effusion. The left lung is clear. IMPRESSION: Resolution of right-sided pleural effusion. Right pneumothorax is noted related to incomplete re-expansion of the right middle and lower lobes. This does not represent a true pneumothorax. Continued follow-up is recommended. Electronically Signed   By: Inez Catalina M.D.   On: 10/23/2016 15:36   Dg Chest 2 View  Result Date:  10/29/2016 CLINICAL DATA:  Shortness of breath.  Prior pneumothorax. EXAM: CHEST  2 VIEW COMPARISON:  August 22, 2016 FINDINGS: There is again noted a pneumothorax on the right which appears marginally larger compared to previous study. There is a new moderate pleural effusion on the right. There may be a minimal tension component with each equivocal shift of heart and mediastinum toward the left, stable. Left lung is clear. The heart size is normal. Pulmonary vascularity on the left is within normal limits. The pulmonary vascularity on the right is mildly distorted due to the pneumothorax. There is an aortic valve replacement. The aorta is prominent but stable. No bone lesions. No adenopathy. IMPRESSION: Right-sided pneumothorax marginally larger. Moderate pleural effusion now present on the right. Question mild tension component. Left lung clear. Stable cardiac silhouette. Prominence of the aorta is likely reflective of aortic valve disease. There is an aortic valve replacement. Critical Value/emergent results were called by telephone at the time of interpretation on 10/29/2016 at 12:28 pm to Dr. Lanelle Bal , who verbally acknowledged these results. Electronically Signed   By: Lowella Grip III M.D.   On: 10/29/2016 12:28   Dg Chest 2 View  Result Date: 10/14/2016 CLINICAL DATA:  Valve replacement EXAM: CHEST  2 VIEW COMPARISON:  10/06/2016 FINDINGS: Stable moderate size right pleural effusion and basilar opacity. Vascularity is within normal limits. No pneumothorax. Left lung is clear. Postop changes in the mediastinum are noted. Aortic valve replacement hardware is stable in position. IMPRESSION: Stable right pleural effusion and basilar pulmonary opacity. No pneumothorax or pulmonary edema. Electronically Signed   By: Marybelle Killings M.D.   On: 10/14/2016 09:06   Dg Chest 2 View  Result Date: 10/06/2016 CLINICAL DATA:  Aortic valve surgery.  Shortness of breath . EXAM: CHEST  2 VIEW COMPARISON:   Chest x-ray 08/14/2016 . FINDINGS: Cardiac valve replacement. Cardiomegaly with normal pulmonary vascularity. Right lung base consolidation right-sided pleural effusion. No pneumothorax. Contrast is noted in the colon . IMPRESSION: 1.  Cardiac valve replacement.  Stable cardiomegaly. 2. Right lung base consolidation and right pleural effusion . Follow-up chest x-rays recommended to demonstrate clearing. Electronically Signed   By: Marcello Moores  Register   On: 10/06/2016 12:54  Dg Op Swallowing Func-medicare/speech Path  Result Date: 10/05/2016 Objective Swallowing Evaluation: Type of Study: MBS-Modified Barium Swallow Study Patient Details Name: Lonnie Hansen MRN: [id number] Date of Birth: March 13, 1950 Today's Date: 10/05/2016 Time: SLP Start Time (ACUTE ONLY): 1425-SLP Stop Time (ACUTE ONLY): 1505 SLP Time Calculation (min) (ACUTE ONLY): 40 min Past Medical History: Past Medical History: Diagnosis Date . Anemia  . Arthritis  . Asymptomatic bilateral carotid artery stenosis 08/2015  1-39%  . Blood dyscrasia   "trouble with my bloods clotting" . Bruising   on the skin states due to platelets or sometimes low or high . Cataracts, bilateral  . Coronary artery disease  . Diverticulosis  . Enlarged aorta ([place])  . Enlarged prostate   slightly . GERD (gastroesophageal reflux disease)   takes Pantoprazole daily as needed . Glaucoma   uses eye drops daily . Headache  . History of colon polyps   benign . History of kidney stones  . Hyperlipidemia   no on any meds . Hypertension   takes Amlodipine and Atenolol daily . Joint pain  . Joint swelling  . Nocturia  . Sleep apnea  . Vocal cord nodule   pt. states  it's a" growth on vocal cord" Past Surgical History: Past Surgical History: Procedure Laterality Date . AORTIC ARCH ANGIOGRAPHY N/A 04/16/2016  Procedure: Aortic Arch Angiography;  Surgeon: [name] Martinique, MD;  Location: [place] [facility];  Service: Cardiovascular;  Laterality: N/A; . AORTIC VALVE REPLACEMENT N/A 09/15/2016   Procedure: AORTIC VALVE REPLACEMENT (AVR);  Surgeon: Grace Isaac, MD;  Location: [place];  Service: Open Heart Surgery;  Laterality: N/A;  Using 40mm Edwards Perimount Magna Ease Aortic Bioprosthesis Valve . ASCENDING AORTIC ROOT REPLACEMENT N/A 09/15/2016  Procedure: ASCENDING AORTIC ROOT REPLACEMENT;  Surgeon: Grace Isaac, MD;  Location: [place];  Service: Open Heart Surgery;  Laterality: N/A;  Using 62mm Gelweave Valsalva Graft . COLONOSCOPY   . COLONOSCOPY WITH ESOPHAGOGASTRODUODENOSCOPY (EGD)   . MULTIPLE EXTRACTIONS WITH ALVEOLOPLASTY N/A 06/10/2016  Procedure: Extraction of tooth #'s 2,8,13,15, and 29  with alveoloplasty, maxillary right and left buccal exostoses reductions, and gross debridement of remaining teeth.;  Surgeon: Lenn Cal, DDS;  Location: [place];  Service: Oral Surgery;  Laterality: N/A; . RIGHT/LEFT HEART CATH AND CORONARY ANGIOGRAPHY N/A 04/16/2016  Procedure: Right/Left Heart Cath and Coronary Angiography;  Surgeon: [name] Martinique, MD;  Location: [place] [facility];  Service: Cardiovascular;  Laterality: N/A; . TEE WITHOUT CARDIOVERSION N/A 09/15/2016  Procedure: TRANSESOPHAGEAL ECHOCARDIOGRAM (TEE);  Surgeon: Grace Isaac, MD;  Location: [place];  Service: Open Heart Surgery;  Laterality: N/A; HPI: The patient is a 66 year old male who first presented to Cardiac Surgery at the time of his preop evaluation for hip replacement. At that time, he was noted to have a dilated aorta. This led to further evaluation including echocardiogram and ultimately cardiac catheterization confirming that the patient had severe aortic insufficiency with a dilated aortic root and proximal ascending aorta. On 09/15/16 pt underwent AORTIC VALVE REPLACEMENT, ASCENDING AORTIC ROOT REPLACEMENT. Extubated 8/23. On 8/28 pt reported difficulty swallowing to PA. 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 He states he has a hard time  swallowing/masticating foods due to gagging.  He had teeth removed in May 2018 before heart surgery but denies gagging sensitivity prior to surgery. Pt states he has been living off of Ensure liquids.  He takes his pills with liquids.  After MBS, pt admitted to anxiety due to his poor health and sleeping only 45 minutes at a time at night - frequently waking "talking out of his head".  He also states he has had word finding deficits since his surgery. He denies needing heimlich maneuver nor having pneumonia.  Pt does say he is "weak" and has no appetitie.    No Data Recorded Assessment / Plan / Recommendation CHL IP CLINICAL IMPRESSIONS 10/05/2016 Clinical Impression Patient presents with oral deficits mostly marked by oral holding, delay in transiting, premature spillage and piecemealing across all consistencies.  Piecemealing was significant - pt required four swallows to clear first bolus of thin.  Uncertain if piecemealing,delay is self created compensatory strategy for this pt.  Due to premature spillage to pyriform sinus and intermittent pharyngeal swallow delay with liquids, pt did have trace laryngeal thin liquid penetration that cleared during swallow.   NO aspiration was observed and pharyngeal swallow is strong.   He demonstrates significant gagging throughout testing with thicker consistencies than thin including with puree/cracker and barium tablet.   Gagging worsened laryngeal penetration but did not result in aspiration.  Pt did not gag on liquids alone - Liquids provided to help pt transit solids into pharynx did not prevent gagging but did aid in transit.   Barium tablet transited with liquids with gag response as pill lodged at vallecular space.  Further immediate intake of liquids facilitated tablet clearance into esophagus.  Pt is largely protective of his airway and his largest barrier to adequate po/airway concerns appears to be his sensitive gag response.  Of note, pt's speech and voice are  clear and there were not significant cranial nerve deficits noted on oral motor exam.    Pt admits to concerns with anxiety regarding his decline in health, sleeping only 45 minutes at a time at night and waking "talking out of his head" at times. SLP questions if pt's excessive gag may be a conditioned response due to negative experiences with po of solids.  Advised him to maximize his liquid intake at this time and consider liquid medication.  Reviewed MBS with pt and sister extensively.  Would recommend follow up for sensitive gag response to help determine source/possible treatment options as this is impacting his ability to meet nutritional needs.  SLP Visit Diagnosis Dysphagia, unspecified (R13.10) Attention and concentration deficit following -- Frontal lobe and executive function deficit following -- Impact on safety and function Mild aspiration risk;Risk for inadequate nutrition/hydration   CHL IP TREATMENT RECOMMENDATION 10/05/2016 Treatment Recommendations Therapy as outlined in treatment plan below   CHL IP DIET RECOMMENDATION 10/05/2016 SLP Diet Recommendations As tolerated Liquid Administration via Cup;Straw Medication Administration Other (Comment) consider liquid medications or crush if difficult Compensations Small sips/bites;Follow solids with liquid Postural Changes Remain semi-upright after after feeds/meals (Comment);Seated upright at 90 degrees   No flowsheet data found.     CHL IP ORAL PHASE 10/05/2016 Oral Phase Impaired Oral - Pudding Teaspoon -- Oral - Pudding Cup -- Oral - Honey Teaspoon -- Oral - Honey Cup -- Oral - Nectar Teaspoon -- Oral - Nectar Cup NT Oral - Nectar Straw -- Oral - Thin Teaspoon NT Oral - Thin Cup Reduced posterior propulsion;Holding of bolus;Piecemeal swallowing;Delayed oral transit;Decreased bolus cohesion;Premature spillage Oral - Thin Straw Reduced posterior propulsion;Holding of bolus;Piecemeal swallowing;Delayed oral transit;Decreased bolus cohesion;Premature  spillage Oral - Puree Reduced posterior propulsion;Holding of bolus;Piecemeal swallowing;Delayed oral transit;Decreased bolus cohesion;Premature spillage Oral - Mech Soft Impaired mastication;Reduced posterior propulsion;Piecemeal swallowing;Delayed oral  transit;Decreased bolus cohesion;Premature spillage Oral - Regular -- Oral - Multi-Consistency -- Oral - Pill Reduced posterior propulsion;Holding of bolus;Delayed oral transit;Decreased bolus cohesion;Premature spillage Oral Phase - Comment pt gags with solids but does not with liquids alone   CHL IP PHARYNGEAL PHASE 10/05/2016 Pharyngeal Phase Impaired Pharyngeal- Pudding Teaspoon -- Pharyngeal -- Pharyngeal- Pudding Cup -- Pharyngeal -- Pharyngeal- Honey Teaspoon -- Pharyngeal -- Pharyngeal- Honey Cup -- Pharyngeal -- Pharyngeal- Nectar Teaspoon -- Pharyngeal -- Pharyngeal- Nectar Cup -- Pharyngeal -- Pharyngeal- Nectar Straw -- Pharyngeal -- Pharyngeal- Thin Teaspoon -- Pharyngeal -- Pharyngeal- Thin Cup Delayed swallow initiation-pyriform sinuses;Penetration/Aspiration before swallow;Penetration/Aspiration during swallow Pharyngeal Material enters airway, remains ABOVE vocal cords then ejected out Pharyngeal- Thin Straw Delayed swallow initiation-pyriform sinuses;Penetration/Aspiration before swallow;Penetration/Aspiration during swallow Pharyngeal Material enters airway, remains ABOVE vocal cords then ejected out Pharyngeal- Puree WFL Pharyngeal -- Pharyngeal- Mechanical Soft -- Pharyngeal -- Pharyngeal- Regular WFL Pharyngeal -- Pharyngeal- Multi-consistency -- Pharyngeal -- Pharyngeal- Pill Delayed swallow initiation-vallecula Pharyngeal -- Pharyngeal Comment pharyngeal swallow is strong without residuals, intermittent mild laryngeal penetration observed with liquids but this clears with every swallow  CHL IP CERVICAL ESOPHAGEAL PHASE 10/05/2016 Cervical Esophageal Phase WFL Pudding Teaspoon -- Pudding Cup -- Honey Teaspoon -- Honey Cup -- Nectar Teaspoon --  Nectar Cup -- Nectar Straw -- Thin Teaspoon -- Thin Cup -- Thin Straw -- Puree -- Mechanical Soft -- Regular -- Multi-consistency -- Pill -- Cervical Esophageal Comment -- CHL IP GO 10/05/2016 Functional Assessment Tool Used MBS Functional Limitations Swallowing Swallow Current Status (Z6109) CJ Swallow Goal Status (U0454) CJ Swallow Discharge Status [phone]) CJ Macario Golds 10/05/2016, 4:52 PM  Luanna Salk, MS [facility] SLP [phone]           CLINICAL DATA:  66 year old male status post cardiac surgery 3 weeks ago with subsequent dysphagia, unintentional weight loss. Generalized weakness. Symptoms now include frequent gagging, which the patient denies experiencing prior to surgery. EXAM: MODIFIED BARIUM SWALLOW TECHNIQUE: Different consistencies of barium were administered orally to the patient by the Speech Pathologist. Imaging of the pharynx was performed in the lateral projection. FLUOROSCOPY TIME:  Fluoroscopy Time:  2 minutes 0 seconds Radiation Exposure Index (if provided by the fluoroscopic device): 12.2 mGy Number of Acquired Spot Images: 0 COMPARISON:  Chest radiographs 09/21/2016 FINDINGS: Thin liquid- frequent flash penetration, but no aspiration occurred. Frequent gagging. Otherwise within normal limits. Pure- Frequent gagging.  Otherwise within normal limits. Cracker- Occasional gagging.  Otherwise within normal limits. Barium tablet - gagging during swallowing, but within normal limits. IMPRESSION: Negative for aspiration, and the patient was able to clear frequent flash penetration. Frequent gagging noted. Please refer to the Speech Pathologists report for complete details and recommendations. Electronically Signed   By: Genevie Ann M.D.   On: 10/05/2016 15:26   Ir Thoracentesis Asp Pleural Space W/img Guide  Result Date: 10/23/2016 INDICATION: Symptomatic right sided pleural effusion EXAM: IR THORACENTESIS ASP PLEURAL SPACE W/IMG GUIDE COMPARISON:  None. MEDICATIONS: 10 cc 1% lidocaine.  COMPLICATIONS: None immediate. TECHNIQUE: Informed written consent was obtained from the patient after a discussion of the risks, benefits and alternatives to treatment. A timeout was performed prior to the initiation of the procedure. Initial ultrasound scanning demonstrates a right pleural effusion. The lower chest was prepped and draped in the usual sterile fashion. 1% lidocaine was used for local anesthesia. Under direct ultrasound guidance, a 19 gauge, 7-cm, Yueh catheter was introduced. An ultrasound image was saved for documentation purposes. The thoracentesis was performed. The catheter was  removed and a dressing was applied. The patient tolerated the procedure well without immediate post procedural complication. The patient was escorted to have an upright chest radiograph. FINDINGS: A total of approximately 2 liters of dark blood fluid was removed. IMPRESSION: Successful ultrasound-guided right sided thoracentesis yielding 2 liters of pleural fluid. Read by Lavonia Drafts [facility] Electronically Signed   By: Aletta Edouard M.D.   On: 10/23/2016 15:25    Labs:  CBC:  Recent Labs  09/21/16 0249 10/07/16 0812 10/14/16 1147 10/29/16 1650  WBC 11.1* 8.3 11.4* 11.5*  HGB 8.5* 10.4* 10.3* 11.5*  HCT 26.2* 32.0* 33.1* 35.4*  PLT 74* 202 190 158    COAGS:  Recent Labs  09/11/16 1046 09/15/16 1530  09/16/16 0045 09/16/16 0114 09/17/16 0251 10/30/16 0636  INR 1.01 1.54  < > 1.33 1.35 1.11 1.15  APTT 27 36  --   --  33  --  31  < > = values in this interval not displayed.  BMP:  Recent Labs  10/07/16 0812 10/14/16 0804 10/14/16 1147 10/29/16 1650  NA 133* 137 133* 134*  K 4.2 4.0 4.1 4.4  CL 93* 97 98* 95*  CO2 24 22 20* 27  GLUCOSE 106* 104* 102* 97  BUN 11 11 11 9   CALCIUM 8.9 9.1 9.0 9.4  CREATININE 0.84 0.95 1.05 0.99  GFRNONAA 91 83 >60 >60  GFRAA 105 96 >60 >60    LIVER FUNCTION TESTS:  Recent Labs  09/18/16 0420 09/19/16 0423 10/14/16 1147 10/29/16 1650   BILITOT 1.5* 0.9 1.3* 0.8  AST 41 36 42* 30  ALT 17 19 63 32  ALKPHOS 31* 37* 130* 88  PROT 5.3* 5.9* 7.5 7.5  ALBUMIN 3.0* 3.2* 3.1* 3.5    TUMOR MARKERS: No results for input(s): AFPTM, CEA, CA199, CHROMGRNA in the last 8760 hours.  Assessment and Plan:  Right pleural effusion post heart valve surgery 09/15/2016 Thoracentesis 9/28: 2 L dark bloody fluid Incomplete re expansion post thora- asymptomatic Follow up with Dr Servando Snare 10/4---CXR still reveals incomplete re expansion and re accumulating pleural effusion Now scheduled for Chest tube placement Risks and benefits discussed with the patient including bleeding, infection, damage to adjacent structures, and sepsis. All of the patient's questions were answered, patient is agreeable to proceed. Consent signed and in chart.   Thank you for this interesting consult.  I greatly enjoyed meeting Lonnie Hansen and look forward to participating in their care.  A copy of this report was sent to the requesting provider on this date.  Electronically Signed: Lavonia Drafts, PA-C 10/30/2016, 7:37 AM   I spent a total of 40 Minutes    in face to face in clinical consultation, greater than 50% of which was counseling/coordinating care for right chest tube placement

## 2016-10-30 NOTE — H&P (Signed)
[address]       [place] [address]             [phone]      Lonnie Hansen [facility] Medical Record #[id number] Date of Birth: 1951-01-01  Referring: No ref. 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 PROCEDURES:  Biologic Bentall with replacement of aortic valve with [employer] pericardial tissue valve, model 3300TFX 29 mm, serial #[id number], and replacement of the aortic root and ascending aorta with a 32 mm Gelweave Valsalva graft and reimplantation of the right and left coronary buttons. SURGEON: Grace Isaac MD  History of Present Illness:    Patient returns office today for follow-up appointment after modified biologic Bentall done August 21. Since surgery the patient had a thoracentesis done on the right, resulting in a right pneumothorax. He returns today with a follow-up x-ray that shows increasing pleural effusion and persistent right pneumothorax. The patient is mildly short of breath but in no distress. He also notes that he's had trouble swallowing since surgery a formal swallowing evaluation was performed without abnormality he has been seen in speech pathology rehabilitation.,    Past Medical History:  Diagnosis Date  . Anemia   . Arthritis   . Asymptomatic bilateral carotid artery stenosis 08/2015   1-39%   . Blood dyscrasia    "trouble with my bloods clotting"  . Bruising    on the skin states due to platelets or sometimes low or high  . Cataracts, bilateral   . Coronary artery disease   . Diverticulosis   . Enlarged aorta ([place])   . Enlarged prostate    slightly  . GERD (gastroesophageal reflux disease)    takes Pantoprazole daily as needed  . Glaucoma    uses  eye drops daily  . Headache   . History of colon polyps    benign  . History of kidney stones   . Hyperlipidemia    no on any meds  . Hypertension    takes Amlodipine and Atenolol daily  . Joint pain   . Joint swelling   . Nocturia   . Sleep apnea   . Vocal cord nodule    pt. states  it's a" growth on vocal cord"     History  Smoking Status  . Former Smoker  . Packs/day: 1.00  . Years: 25.00  . Types: Cigarettes  Smokeless Tobacco  . Never Used    History  Alcohol Use No     Allergies  Allergen Reactions  . No Known Allergies     Current Facility-Administered Medications  Medication Dose Route Frequency Provider Last Rate Last Dose  . 0.9 %  sodium chloride infusion  250 mL Intravenous PRN Barrett, Erin R, PA-C      . acetaminophen (TYLENOL) tablet 650 mg  650 mg Oral Q6H PRN Barrett, Erin R, PA-C       Or  . acetaminophen (TYLENOL) suppository 650 mg  650 mg Rectal Q6H PRN Barrett, Erin R, PA-C      . amiodarone (PACERONE) tablet 200 mg  200 mg Oral BID Barrett, Erin R, PA-C      . amLODipine (NORVASC) tablet 10 mg  10 mg Oral Daily Barrett, Erin R, PA-C      .  aspirin EC tablet 81 mg  81 mg Oral Daily Barrett, Erin R, PA-C   81 mg at 10/29/16 1810  . atenolol (TENORMIN) tablet 12.5 mg  12.5 mg Oral BID Barrett, Erin R, PA-C   12.5 mg at 10/29/16 2057  . atorvastatin (LIPITOR) tablet 40 mg  40 mg Oral q1800 Barrett, Erin R, PA-C      . docusate sodium (COLACE) capsule 100 mg  100 mg Oral BID Barrett, Erin R, PA-C      . enoxaparin (LOVENOX) injection 40 mg  40 mg Subcutaneous Q24H Barrett, Erin R, PA-C      . furosemide (LASIX) tablet 20 mg  20 mg Oral Daily Barrett, Erin R, PA-C      . latanoprost (XALATAN) 0.005 % ophthalmic solution 1 drop  1 drop Both Eyes QHS Barrett, Erin R, PA-C   1 drop at 10/29/16 2058  . levalbuterol (XOPENEX) nebulizer solution 0.63 mg  0.63 mg Nebulization Q6H PRN Barrett, Erin R, PA-C      . lidocaine (XYLOCAINE) 1 % (with pres)  injection           . ondansetron (ZOFRAN) tablet 4 mg  4 mg Oral Q6H PRN Barrett, Erin R, PA-C       Or  . ondansetron (ZOFRAN) injection 4 mg  4 mg Intravenous Q6H PRN Barrett, Erin R, PA-C      . pantoprazole (PROTONIX) EC tablet 40 mg  40 mg Oral Daily Barrett, Erin R, PA-C   40 mg at 10/29/16 1810  . sodium chloride flush (NS) 0.9 % injection 3 mL  3 mL Intravenous Q12H Barrett, Erin R, PA-C   3 mL at 10/29/16 2058  . sodium chloride flush (NS) 0.9 % injection 3 mL  3 mL Intravenous PRN Barrett, Erin R, PA-C      . traMADol (ULTRAM) tablet 50 mg  50 mg Oral Q6H PRN Barrett, Erin R, PA-C           Physical Exam: BP 117/81 (BP Location: Right Arm)   Pulse 73   Temp 98.9 F (37.2 C) (Oral)   Resp 15   Ht 5\' 9"  (1.753 m)   Wt 182 lb 15.7 oz (83 kg)   SpO2 98%   BMI 27.02 kg/m   General appearance: alert and cooperative Neurologic: intact Heart: regular rate and rhythm, S1, S2 normal, no murmur, click, rub or gallop Lungs: diminished breath sounds RLL and RML Abdomen: soft, non-tender; bowel sounds normal; no masses,  no organomegaly Extremities: extremities normal, atraumatic, no cyanosis or edema and Homans sign is negative, no sign of DVT Wound: sternum stable   Diagnostic Studies & Laboratory data:     Recent Radiology Findings:   Dg Chest 2 View  Result Date: 10/29/2016 CLINICAL DATA:  Shortness of breath.  Prior pneumothorax. EXAM: CHEST  2 VIEW COMPARISON:  August 22, 2016 FINDINGS: There is again noted a pneumothorax on the right which appears marginally larger compared to previous study. There is a new moderate pleural effusion on the right. There may be a minimal tension component with each equivocal shift of heart and mediastinum toward the left, stable. Left lung is clear. The heart size is normal. Pulmonary vascularity on the left is within normal limits. The pulmonary vascularity on the right is mildly distorted due to the pneumothorax. There is an aortic valve  replacement. The aorta is prominent but stable. No bone lesions. No adenopathy. IMPRESSION: Right-sided pneumothorax marginally larger. Moderate pleural effusion now present on  the right. Question mild tension component. Left lung clear. Stable cardiac silhouette. Prominence of the aorta is likely reflective of aortic valve disease. There is an aortic valve replacement. Critical Value/emergent results were called by telephone at the time of interpretation on 10/29/2016 at 12:28 pm to Dr. Lanelle Bal , who verbally acknowledged these results. Electronically Signed   By: Lowella Grip III M.D.   On: 10/29/2016 12:28      Recent Lab Findings: Lab Results  Component Value Date   WBC 11.5 (H) 10/29/2016   HGB 11.5 (L) 10/29/2016   HCT 35.4 (L) 10/29/2016   PLT 158 10/29/2016   GLUCOSE 97 10/29/2016   CHOL 135 10/07/2016   TRIG 75 10/07/2016   HDL 34 (L) 10/07/2016   LDLCALC 86 10/07/2016   ALT 32 10/29/2016   AST 30 10/29/2016   NA 134 (L) 10/29/2016   K 4.4 10/29/2016   CL 95 (L) 10/29/2016   CREATININE 0.99 10/29/2016   BUN 9 10/29/2016   CO2 27 10/29/2016   TSH 1.611 04/28/2010   INR 1.11 09/17/2016   HGBA1C 5.3 09/11/2016   Result status: Final result                           *[facility]*                        [address], [place] [address]                            [phone]  ------------------------------------------------------------------- Transthoracic Echocardiography  Patient:    Lonnie Hansen, Lonnie Hansen MR #:       [id number] Study Date: 10/09/2016 Gender:     M Age:        11 Height:     175.3 cm Weight:     90.1 kg BSA:        2.12 m^2 Pt. Status: Room:   SONOGRAPHER  Diamond Nickel  PERFORMING   [facility], Out[facility]  Joanna Hews [phone]  [place] [phone]  [place], MD  cc:  ------------------------------------------------------------------- LV EF: 60% -    65%  ------------------------------------------------------------------- Indications:      I31.3 Pericardial effusion.  ------------------------------------------------------------------- History:   PMH:  Aortic valve replacement. Aortic root dilatation. Sleep apnea.  Syncope.  Coronary artery disease.  Risk factors: Hypertension. Dyslipidemia.  ------------------------------------------------------------------- Study Conclusions  - Left ventricle: The cavity size was normal. Wall thickness was   increased in a pattern of mild LVH. Systolic function was normal.   The estimated ejection fraction was in the range of 60% to 65%. - Aortic valve: AV prosthesis appears to open well Peak and mean   gradients through the valve are 9 and 5 mm Hg respectively. - Mitral valve: Calcified annulus. Mildly thickened leaflets .   There was mild regurgitation. - Pulmonary arteries: PA peak pressure: 37 mm Hg (S). - Pericardium, extracardiac: A trivial pericardial effusion was   identified.  ------------------------------------------------------------------- Study data:  Comparison was made to the study of 09/16/2016.  Study status:  Routine.  Procedure:  Transthoracic echocardiography. Image quality was adequate.  Study completion:  There were no complications.  Transthoracic echocardiography.  M-mode, complete 2D, spectral Doppler, and color Doppler.  Birthdate: Patient birthdate: 03-Oct-1950.  Age:  Patient is 66 yr old.  Sex: Gender: male.    BMI: 29.3 kg/m^2.  Blood pressure:     114/81 Patient status:  Outpatient.  Study date:  Study date: 10/09/2016. Study time: 01:03 PM.  Location:  [facility]  -------------------------------------------------------------------  ------------------------------------------------------------------- Left ventricle:  Difficult acoustic windows LVEF is normal at approximately 60 % with mossible mild distal inferior hypokeinesis. The  cavity size was normal. Wall thickness was increased in a pattern of mild LVH. Systolic function was normal. The estimated ejection fraction was in the range of 60% to 65%.  ------------------------------------------------------------------- Aortic valve:  AV prosthesis appears to open well Peak and mean gradients through the valve are 9 and 5 mm Hg respectively.  Mildly thickened leaflets.  Doppler:  There was no regurgitation.    VTI ratio of LVOT to aortic valve: 0.47. Peak velocity ratio of LVOT to aortic valve: 0.47. Mean velocity ratio of LVOT to aortic valve: 0.46.    Mean gradient (S): 5 mm Hg. Peak gradient (S): 9 mm Hg.   ------------------------------------------------------------------- Aorta:  Aortic root dilated at 43 mm Ascending aorta is dilated at 45 mm.  ------------------------------------------------------------------- Mitral valve:   Calcified annulus. Mildly thickened leaflets . Leaflet separation was normal.  Doppler:  Transvalvular velocity was within the normal range. There was no evidence for stenosis. There was mild regurgitation.    Peak gradient (D): 2 mm Hg.  ------------------------------------------------------------------- Left atrium:  The atrium was normal in size.  ------------------------------------------------------------------- Right ventricle:  The cavity size was normal. Wall thickness was normal. Systolic function was normal.  ------------------------------------------------------------------- Pulmonic valve:    Structurally normal valve.   Cusp separation was normal.  Doppler:  Transvalvular velocity was within the normal range. There was no regurgitation.  ------------------------------------------------------------------- Tricuspid valve:   Structurally normal valve.   Leaflet separation was normal.  Doppler:  Transvalvular velocity was within the normal range. There was trivial  regurgitation.  ------------------------------------------------------------------- Right atrium:  The atrium was normal in size.  ------------------------------------------------------------------- Pericardium:  A trivial pericardial effusion was identified.  ------------------------------------------------------------------- Systemic veins: Inferior vena cava: The vessel was normal in size. The respirophasic diameter changes were in the normal range (= 50%), consistent with normal central venous pressure.  ------------------------------------------------------------------- Post procedure conclusions Ascending Aorta:  - Aortic root dilated at 43 mm Ascending aorta is dilated at 45 mm.  ------------------------------------------------------------------- Measurements   Left ventricle                         Value        Reference  LV ID, ED, PLAX chordal                46.1  mm     43 - 52  LV ID, ES, PLAX chordal                37.2  mm     23 - 38  LV fx shortening, PLAX chordal (L)     19    %      >=29  LV PW thickness, ED                    14.7  mm     ---------  IVS/LV PW ratio, ED  0.86         <=1.3  LV e&', lateral                         9.87  cm/s   ---------  LV E/e&', lateral                       7.95         ---------  LV e&', medial                          10.6  cm/s   ---------  LV E/e&', medial                        7.41         ---------  LV e&', average                         10.24 cm/s   ---------  LV E/e&', average                       7.67         ---------    Ventricular septum                     Value        Reference  IVS thickness, ED                      12.7  mm     ---------    LVOT                                   Value        Reference  LVOT peak velocity, S                  70.6  cm/s   ---------  LVOT mean velocity, S                  49.9  cm/s   ---------  LVOT VTI, S                            13.5  cm      ---------  LVOT peak gradient, S                  2     mm Hg  ---------    Aortic valve                           Value        Reference  Aortic valve peak velocity, S          150   cm/s   ---------  Aortic valve mean velocity, S          108   cm/s   ---------  Aortic valve VTI, S                    29    cm     ---------  Aortic mean gradient, S  5     mm Hg  ---------  Aortic peak gradient, S                9     mm Hg  ---------  VTI ratio, LVOT/AV                     0.47         ---------  Velocity ratio, peak, LVOT/AV          0.47         ---------  Velocity ratio, mean, LVOT/AV          0.46         ---------    Left atrium                            Value        Reference  LA ID, A-P, ES                         33    mm     ---------  LA ID/bsa, A-P                         1.56  cm/m^2 <=2.2  LA volume, ES, 1-p A4C                 38    ml     ---------  LA volume/bsa, ES, 1-p A4C             18    ml/m^2 ---------    Mitral valve                           Value        Reference  Mitral E-wave peak velocity            78.5  cm/s   ---------  Mitral A-wave peak velocity            51.8  cm/s   ---------  Mitral deceleration time       (L)     130   ms     150 - 230  Mitral peak gradient, D                2     mm Hg  ---------  Mitral E/A ratio, peak                 1.5          ---------    Pulmonary arteries                     Value        Reference  PA pressure, S, DP             (H)     37    mm Hg  <=30    Tricuspid valve                        Value        Reference  Tricuspid regurg peak velocity         270   cm/s   ---------  Tricuspid peak RV-RA gradient          29    mm Hg  ---------  Systemic veins                         Value        Reference  Estimated CVP                          8     mm Hg  ---------    Right ventricle                        Value        Reference  RV pressure, S, DP             (H)     37    mm Hg   <=30  Legend: (L)  and  (H)  mark values outside specified reference range.  ------------------------------------------------------------------- Prepared and Electronically Authenticated by  Dorris Carnes, M.D. 2018-09-16T20:23:22      Assessment / Plan:      Patient status post Bentall with biologic valve, now presents to the office with a increasing right pleural effusion and pneumothorax. Primary care had arranged a thoracentesis on 928 following this the patient had pneumothorax was discharged home. He returns for his regular follow-up appointment today. We'll plan on admitting the patient have IR place a pigtail catheter into the right chest to drain fluid and air While the patient is in the hospital will also have speech pathology returned to see him      Grace Isaac MD      [address] [place] [address] Office [phone]   Beeper [phone]  10/30/2016 7:13 AM

## 2016-10-30 NOTE — Care Management Note (Signed)
Case Management Note  Patient Details  Name: Lonnie Hansen MRN: [id number] Date of Birth: [birth year]  Subjective/Objective:                 Patient from home w wife admitted from office to have IR place a pigtail catheter into the right chest to drain fluid and air. Was discharged two weeks ago with [facility].   Action/Plan:   Expected Discharge Date:  10/29/16               Expected Discharge Plan:  [place]  In-House Referral:     Discharge planning Services  CM Consult  Post Acute Care Choice:    Choice offered to:     DME Arranged:    DME Agency:     HH Arranged:    HH Agency:     Status of Service:  In process, will continue to follow  If discussed at Long Length of Stay Meetings, dates discussed:    Additional Comments:  Carles Collet, RN 10/30/2016, 11:04 AM

## 2016-10-30 NOTE — Procedures (Signed)
Rt hydroptx  S/p 14 fr rt chest CT drain  No comp Stable 30cc bloody fld aspirated cx sent EBL 0 Full report in pacs

## 2016-10-30 NOTE — Progress Notes (Signed)
Pt arrived back from IR to 4e15. Right pigtail chest tube present; placed to 20cm suction. Vitals obtained. Pt denies needs.   Grant Fontana BSN, RN

## 2016-10-30 NOTE — Sedation Documentation (Signed)
Patient resting with no complaints at present

## 2016-10-30 NOTE — Progress Notes (Addendum)
[address]       [place] [address]             [phone]         Subjective: Just back from pigtail placement  Objective: Vital signs in last 24 hours: Temp:  [98.2 F (36.8 C)-98.9 F (37.2 C)] 98.9 F (37.2 C) (10/05 0341) Pulse Rate:  [73-85] 79 (10/05 0913) Cardiac Rhythm: Normal sinus rhythm (10/05 0913) Resp:  [15-22] 18 (10/05 0913) BP: (100-129)/(79-90) 115/82 (10/05 0913) SpO2:  [96 %-100 %] 100 % (10/05 0913) Weight:  [178 lb (80.7 kg)-184 lb 12.8 oz (83.8 kg)] 182 lb 15.7 oz (83 kg) (10/05 0341)  Hemodynamic parameters for last 24 hours:    Intake/Output from previous day: 10/04 0701 - 10/05 0700 In: 360 [P.O.:360] Out: 750 [Urine:750] Intake/Output this shift: No intake/output data recorded.  General appearance: alert, cooperative and no distress Heart: regular rate and rhythm Lungs: fair air exchange with som adventitious noise Abdomen: benign Extremities: no edema Wound: incis healing well  Lab Results:  Recent Labs  10/29/16 1650  WBC 11.5*  HGB 11.5*  HCT 35.4*  PLT 158   BMET:  Recent Labs  10/29/16 1650  NA 134*  K 4.4  CL 95*  CO2 27  GLUCOSE 97  BUN 9  CREATININE 0.99  CALCIUM 9.4    PT/INR:  Recent Labs  10/30/16 0636  LABPROT 14.6  INR 1.15   ABG    Component Value Date/Time   PHART 7.483 (H) 09/17/2016 1452   HCO3 26.6 09/17/2016 1452   TCO2 28 09/17/2016 1452   ACIDBASEDEF 1.0 09/16/2016 1048   O2SAT 59.9 09/20/2016 0355   CBG (last 3)  No results for input(s): GLUCAP in the last 72 hours.  Meds Scheduled Meds: . amiodarone  200 mg Oral BID  . amLODipine  10 mg Oral Daily  . aspirin EC  81 mg Oral Daily  . atenolol  12.5 mg Oral BID  . docusate sodium  100 mg Oral BID  . enoxaparin (LOVENOX) injection  40 mg Subcutaneous Q24H  . fentaNYL      . furosemide  20 mg Oral Daily  . latanoprost  1 drop Both Eyes QHS  . lidocaine      . midazolam      . pantoprazole  40 mg Oral  Daily  . sodium chloride flush  3 mL Intravenous Q12H  . sodium chloride flush  5 mL Intravenous Q8H   Continuous Infusions: . sodium chloride     PRN Meds:.sodium chloride, acetaminophen **OR** acetaminophen, levalbuterol, ondansetron **OR** ondansetron (ZOFRAN) IV, sodium chloride flush, traMADol  Xrays Dg Chest 2 View  Result Date: 10/29/2016 CLINICAL DATA:  Shortness of breath.  Prior pneumothorax. EXAM: CHEST  2 VIEW COMPARISON:  August 22, 2016 FINDINGS: There is again noted a pneumothorax on the right which appears marginally larger compared to previous study. There is a new moderate pleural effusion on the right. There may be a minimal tension component with each equivocal shift of heart and mediastinum toward the left, stable. Left lung is clear. The heart size is normal. Pulmonary vascularity on the left is within normal limits. The pulmonary vascularity on the right is mildly distorted due to the pneumothorax. There is an aortic valve replacement. The aorta is prominent but stable. No bone lesions. No adenopathy. IMPRESSION: Right-sided pneumothorax marginally larger. Moderate pleural effusion now present on the right. Question mild tension component. Left lung  clear. Stable cardiac silhouette. Prominence of the aorta is likely reflective of aortic valve disease. There is an aortic valve replacement. Critical Value/emergent results were called by telephone at the time of interpretation on 10/29/2016 at 12:28 pm to Dr. Lanelle Bal , who verbally acknowledged these results. Electronically Signed   By: Lowella Grip III M.D.   On: 10/29/2016 12:28   Ct Image Guided Drainage By Percutaneous Catheter  Result Date: 10/30/2016 INDICATION: Right hydropneumothorax, shortness of breath, chest discomfort EXAM: CT RIGHT CHEST TUBE INSERTION MEDICATIONS: 1% LIDOCAINE LOCAL ANESTHESIA/SEDATION: Fentanyl 50 mcg IV; Versed 1.0 mg IV Moderate Sedation Time:  10 MINUTES The patient was continuously  monitored during the procedure by the interventional radiology nurse under my direct supervision. COMPLICATIONS: None immediate. PROCEDURE: Informed written consent was obtained from the patient after a thorough discussion of the procedural risks, benefits and alternatives. All questions were addressed. Maximal Sterile Barrier Technique was utilized including caps, mask, sterile gowns, sterile gloves, sterile drape, hand hygiene and skin antiseptic. A timeout was performed prior to the initiation of the procedure. Patient positioned supine. Noncontrast localization CT performed. The right hydropneumothorax was localized inferiorly. Right lower intercostal space marked in the mid axillary line. Under sterile conditions and local anesthesia, CT guidance was utilized to advance an 18 gauge 10 cm introducer needle into the right hydropneumothorax inferiorly. Needle position confirmed with CT. Amplatz guidewire inserted. Tract dilatation performed to insert a 14 French drain. Retention loop formed within the pleural effusion. Catheter position confirmed with CT. 30 cc bloody fluid aspirated. Sample sent for culture. Catheter secured with a Prolene suture and connected to external Pleur-Evac. Sterile dressing applied. No immediate complication. Patient tolerated the procedure well. IMPRESSION: Successful CT-guided right chest 14 French drain insertion for a persistent right hydropneumothorax. Electronically Signed   By: Jerilynn Mages.  Shick M.D.   On: 10/30/2016 09:55    Assessment/Plan: Hemodyn stable in sinus rhythm S/P right pigtail, no air leak noted, follow clinically and with CXR Cultures were obtained  LOS: 1 day    GOLD,WAYNE E 10/30/2016   Check bnp in am  650 from chest tube, no air leak currently  I have seen and examined [name] and agree with the above assessment  and plan.  Grace Isaac MD Beeper [phone] Office [phone] 10/30/2016 11:01 AM

## 2016-10-31 ENCOUNTER — Inpatient Hospital Stay (HOSPITAL_COMMUNITY): Payer: Medicare Other

## 2016-10-31 LAB — BASIC METABOLIC PANEL
Anion gap: 11 (ref 5–15)
BUN: 7 mg/dL (ref 6–20)
CO2: 26 mmol/L (ref 22–32)
Calcium: 8.8 mg/dL — ABNORMAL LOW (ref 8.9–10.3)
Chloride: 97 mmol/L — ABNORMAL LOW (ref 101–111)
Creatinine, Ser: 0.75 mg/dL (ref 0.61–1.24)
GFR calc Af Amer: >60 mL/min (ref >60)
GFR calc non Af Amer: >60 mL/min (ref >60)
Glucose, Bld: 106 mg/dL — ABNORMAL HIGH (ref 65–99)
Potassium: 3.7 mmol/L (ref 3.5–5.1)
Sodium: 134 mmol/L — ABNORMAL LOW (ref 135–145)

## 2016-10-31 LAB — CBC
HCT: 34.1 % — ABNORMAL LOW (ref 39.0–52.0)
Hemoglobin: 10.5 g/dL — ABNORMAL LOW (ref 13.0–17.0)
MCH: 26.9 pg (ref 26.0–34.0)
MCHC: 30.8 g/dL (ref 30.0–36.0)
MCV: 87.2 fL (ref 78.0–100.0)
Platelets: 142 10*3/uL — ABNORMAL LOW (ref 150–400)
RBC: 3.91 MIL/uL — ABNORMAL LOW (ref 4.22–5.81)
RDW: 16.2 % — ABNORMAL HIGH (ref 11.5–15.5)
WBC: 8.1 10*3/uL (ref 4.0–10.5)

## 2016-10-31 MED ORDER — POTASSIUM CHLORIDE CRYS ER 10 MEQ PO TBCR
30.0000 meq | EXTENDED_RELEASE_TABLET | Freq: Once | ORAL | Status: AC
  Administered 2016-10-31: 30 meq via ORAL
  Filled 2016-10-31: qty 1

## 2016-10-31 NOTE — Evaluation (Signed)
Clinical/Bedside Swallow Evaluation Patient Details  Name: Lonnie Hansen MRN: [id number] Date of Birth: [birth year]  Today's Date: 10/31/2016 Time: SLP Start Time (ACUTE ONLY): 1220 SLP Stop Time (ACUTE ONLY): 1242 SLP Time Calculation (min) (ACUTE ONLY): 22 min  Past Medical History:  Past Medical History:  Diagnosis Date  . Anemia   . Arthritis   . Asymptomatic bilateral carotid artery stenosis 08/2015   1-39%   . Blood dyscrasia    "trouble with my bloods clotting"  . Bruising    on the skin states due to platelets or sometimes low or high  . Cataracts, bilateral   . Coronary artery disease   . Diverticulosis   . Enlarged aorta ([place])   . Enlarged prostate    slightly  . GERD (gastroesophageal reflux disease)    takes Pantoprazole daily as needed  . Glaucoma    uses eye drops daily  . Headache   . History of colon polyps    benign  . History of kidney stones   . Hyperlipidemia    no on any meds  . Hypertension    takes Amlodipine and Atenolol daily  . Joint pain   . Joint swelling   . Nocturia   . Sleep apnea   . Vocal cord nodule    pt. states  it's a" growth on vocal cord"   Past Surgical History:  Past Surgical History:  Procedure Laterality Date  . AORTIC ARCH ANGIOGRAPHY N/A 04/16/2016   Procedure: Aortic Arch Angiography;  Surgeon: [name] Martinique, MD;  Location: [facility];  Service: Cardiovascular;  Laterality: N/A;  . AORTIC VALVE REPLACEMENT N/A 09/15/2016   Procedure: AORTIC VALVE REPLACEMENT (AVR);  Surgeon: Grace Isaac, MD;  Location: [facility];  Service: Open Heart Surgery;  Laterality: N/A;  Using 76mm Edwards Perimount Magna Ease Aortic Bioprosthesis Valve  . ASCENDING AORTIC ROOT REPLACEMENT N/A 09/15/2016   Procedure: ASCENDING AORTIC ROOT REPLACEMENT;  Surgeon: Grace Isaac, MD;  Location: [facility];  Service: Open Heart Surgery;  Laterality: N/A;  Using 70mm Gelweave Valsalva Graft  . COLONOSCOPY    . COLONOSCOPY WITH  ESOPHAGOGASTRODUODENOSCOPY (EGD)    . IR THORACENTESIS ASP PLEURAL SPACE W/IMG GUIDE  10/23/2016  . MULTIPLE EXTRACTIONS WITH ALVEOLOPLASTY N/A 06/10/2016   Procedure: Extraction of tooth #'s 2,8,13,15, and 29  with alveoloplasty, maxillary right and left buccal exostoses reductions, and gross debridement of remaining teeth.;  Surgeon: Lenn Cal, DDS;  Location: [facility];  Service: Oral Surgery;  Laterality: N/A;  . RIGHT/LEFT HEART CATH AND CORONARY ANGIOGRAPHY N/A 04/16/2016   Procedure: Right/Left Heart Cath and Coronary Angiography;  Surgeon: [name] Martinique, MD;  Location: [facility];  Service: Cardiovascular;  Laterality: N/A;  . TEE WITHOUT CARDIOVERSION N/A 09/15/2016   Procedure: TRANSESOPHAGEAL ECHOCARDIOGRAM (TEE);  Surgeon: Grace Isaac, MD;  Location: [facility];  Service: Open Heart Surgery;  Laterality: N/A;   HPI:  Pt admitted for increased pleural effusion and is s/p right chest tube 10/5 for ex vacuo ptx post thoracentesis. The patient is a 66 year old male who first reported difficulty swallowing 09/22/16 s/p aortic valve replacement, ascending aortic root replacement. Pt was intubated for total of 3 days following procedure. 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    Assessment / Plan / Recommendation Clinical Impression  SLP with bedside swallow evaluation. Pt alert upright in bed, consuming lunch meal. Swallow hx obtained from the  pt, who reports previous swallow difficulty which he states has recently improved signficantly since hospitalization. Prior swallow deficits impacted intake with frequent gagging and pt reports 30lb weight loss in one month. Note prolonged mastication of solid consistencies secondary to missing dentition. No overt s/sx of aspiration with any consistencies. Recommend continue regular thin liquid diet. ST to follow up x1 for diet tolerance and ensure resolution of previously limiting swallow  deficits.  Marland Kitchen  SLP Visit Diagnosis: Dysphagia, unspecified (R13.10)    Aspiration Risk  Mild aspiration risk    Diet Recommendation   Regular thin liquids  Medication Administration: Whole meds with liquid    Other  Recommendations Oral Care Recommendations: Oral care BID   Follow up Recommendations        Frequency and Duration min 1 x/week  1 week       Prognosis Prognosis for Safe Diet Advancement: Good      Swallow Study   General Date of Onset: 10/29/16 HPI: Pt admitted for increased pleural effusion and is s/p right chest tube 10/5 for ex vacuo ptx post thoracentesis. The patient is a 66 year old male who first reported difficulty swallowing 09/22/16 s/p aortic valve replacement, ascending aortic root replacement. Pt was intubated for total of 3 days following procedure. 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  Type of Study: Bedside Swallow Evaluation Previous Swallow Assessment: MBS 9/10; mild oropharyngeal dysphagia Diet Prior to this Study: Regular;Thin liquids Temperature Spikes Noted: No Respiratory Status: Room air History of Recent Intubation: No Behavior/Cognition: Alert;Cooperative;Pleasant mood Oral Cavity Assessment: Within Functional Limits Oral Cavity - Dentition: Missing dentition Vision: Functional for self-feeding Self-Feeding Abilities: Able to feed self Patient Positioning: Upright in bed Baseline Vocal Quality: Hoarse Volitional Cough: Strong Volitional Swallow: Able to elicit    Oral/Motor/Sensory Function Overall Oral Motor/Sensory Function: Within functional limits   Ice Chips Ice chips: Not tested   Thin Liquid Thin Liquid: Within functional limits Presentation: Cup;Straw    Nectar Thick Nectar Thick Liquid: Not tested   Honey Thick Honey Thick Liquid: Not tested   Puree Puree: Within functional limits   Solid   GO   Solid: Impaired Presentation: Self Fed Oral Phase Functional  Implications: Prolonged oral transit;Impaired mastication;Oral residue Pharyngeal Phase Impairments: Suspected delayed Swallow;Multiple swallows       Arvil Chaco MA, CCC-SLP Acute Care Speech Language Pathologist    Levi Aland 10/31/2016,12:54 PM

## 2016-10-31 NOTE — Progress Notes (Addendum)
      [address]       [place] [address]             [phone]           Subjective: Patient without complaints this am.  Objective: Vital signs in last 24 hours: Temp:  [98 F (36.7 C)-98.4 F (36.9 C)] 98.4 F (36.9 C) (10/05 2222) Pulse Rate:  [77-86] 85 (10/06 0639) Cardiac Rhythm: Normal sinus rhythm (10/05 1900) Resp:  [14-22] 17 (10/06 0639) BP: (96-122)/(78-90) 113/81 (10/05 2222) SpO2:  [98 %-100 %] 100 % (10/06 0639) Weight:  [181 lb 6.4 oz (82.3 kg)] 181 lb 6.4 oz (82.3 kg) (10/06 0639)     Intake/Output from previous day: 10/05 0701 - 10/06 0700 In: 123 [P.O.:120; I.V.:3] Out: 2005 [Urine:1155; Chest Tube:850]   Physical Exam:  Cardiovascular: RRR Pulmonary: Clear to auscultation on the left and slightly diminished at the right base Abdomen: Soft, non tender, bowel sounds present. Extremities: Trace bilateral lower extremity edema. Wounds: Clean and dry.  No erythema or signs of infection. Chest Tube: to suction, no air leak  Lab Results: CBC: Recent Labs  10/29/16 1650 10/31/16 0628  WBC 11.5* 8.1  HGB 11.5* 10.5*  HCT 35.4* 34.1*  PLT 158 142*   BMET:  Recent Labs  10/29/16 1650 10/31/16 0628  NA 134* 134*  K 4.4 3.7  CL 95* 97*  CO2 27 26  GLUCOSE 97 106*  BUN 9 7  CREATININE 0.99 0.75  CALCIUM 9.4 8.8*    PT/INR:  Recent Labs  10/30/16 0636  LABPROT 14.6  INR 1.15   ABG:  INR: Will add last result for INR, ABG once components are confirmed Will add last 4 CBG results once components are confirmed  Assessment/Plan:  1. CV - SR. On Atenolol 12.5 mg bid and Amlodipine 10 mg daily. 2.  Pulmonary - Pigtail catheter with 850 cc since placement yesterday. There is no air leak. CXR this am appears stable. Place chest tube to water seal. Check CXR in am. Right pleural fluid culture results pending. 3. Anemia-H and H 10.5 and 34.1 4. Supplement potassium 5. Mild thrombocytopenia-platelets 142,000 6.  GI-await swallow study by speech  ZIMMERMAN,DONIELLE MPA-C 10/31/2016,8:09 AM Leave chest tube to suction Check x-ray in a.m. No sign of aspiration on swallow evaluation patient examined and medical record reviewed,agree with above note. Tharon Aquas Trigt III 10/31/2016

## 2016-10-31 NOTE — Progress Notes (Signed)
Referring Physician(s): Gerhardt,E  Supervising Physician: Jacqulynn Cadet  Patient Status:  [facility] - In-pt  Chief Complaint: Right pneumothorax   Subjective: Pt without acute changes; has some right lat chest soreness with coughing; no worsening dyspnea; has ambulated   Allergies: Patient has no known allergies.  Medications: Prior to Admission medications   Medication Sig Start Date End Date Taking? Authorizing Provider  amLODipine (NORVASC) 10 MG tablet Take 10 mg by mouth daily.   Yes [provider]  aspirin EC 81 MG EC tablet Take 1 tablet (81 mg total) by mouth daily. 09/24/16  Yes Gold, Wayne E, PA-C  atenolol (TENORMIN) 25 MG tablet Take 0.5 tablets (12.5 mg total) by mouth 2 (two) times daily. 09/23/16  Yes Gold, Patrick Jupiter E, PA-C  folic acid (FOLVITE) 1 MG tablet Take 1 tablet (1 mg total) by mouth daily. 09/24/16  Yes Gold, Wayne E, PA-C  furosemide (LASIX) 20 MG tablet Take 1 tablet (20 mg total) by mouth daily. 10/06/16 01/04/17 Yes Almyra Deforest, PA  latanoprost (XALATAN) 0.005 % ophthalmic solution Place 1 drop into both eyes at bedtime.   Yes [provider]  Multiple Vitamin (MULTIVITAMIN WITH MINERALS) TABS tablet Take 1 tablet by mouth daily.   Yes [provider]  pantoprazole (PROTONIX) 40 MG tablet Take 40 mg by mouth daily as needed (indigestion).    Yes [provider]  traMADol (ULTRAM) 50 MG tablet Take 1 tablet (50 mg total) by mouth every 6 (six) hours as needed for moderate pain. 09/23/16  Yes Gold, Wilder Glade, PA-C  amiodarone (PACERONE) 200 MG tablet Take 1 tablet (200 mg total) by mouth 2 (two) times daily after a meal. Patient not taking: Reported on 10/30/2016 09/23/16   Jadene Pierini E, PA-C  atorvastatin (LIPITOR) 40 MG tablet Take 1 tablet (40 mg total) by mouth daily. Patient not taking: Reported on 10/29/2016 10/06/16 01/04/17  Almyra Deforest, PA  Ferrous Sulfate (IRON) 325 (65 Fe) MG TABS Take 1 tablet (325 mg total) by mouth 2  (two) times daily with a meal. Patient not taking: Reported on 10/29/2016 10/02/16   Grace Isaac, MD     Vital Signs: BP 113/81 (BP Location: Left Arm)   Pulse 85   Temp 98.4 F (36.9 C) (Oral)   Resp 17   Ht 5\' 9"  (1.753 m)   Wt 181 lb 6.4 oz (82.3 kg)   SpO2 100%   BMI 26.79 kg/m   Physical Exam awake/alert; rt chest tube intact, no air leak, about 900 cc dark bloody fluid in pleuravac  Imaging: Dg Chest 2 View  Result Date: 10/29/2016 CLINICAL DATA:  Shortness of breath.  Prior pneumothorax. EXAM: CHEST  2 VIEW COMPARISON:  August 22, 2016 FINDINGS: There is again noted a pneumothorax on the right which appears marginally larger compared to previous study. There is a new moderate pleural effusion on the right. There may be a minimal tension component with each equivocal shift of heart and mediastinum toward the left, stable. Left lung is clear. The heart size is normal. Pulmonary vascularity on the left is within normal limits. The pulmonary vascularity on the right is mildly distorted due to the pneumothorax. There is an aortic valve replacement. The aorta is prominent but stable. No bone lesions. No adenopathy. IMPRESSION: Right-sided pneumothorax marginally larger. Moderate pleural effusion now present on the right. Question mild tension component. Left lung clear. Stable cardiac silhouette. Prominence of the aorta is likely reflective of aortic valve disease.  There is an aortic valve replacement. Critical Value/emergent results were called by telephone at the time of interpretation on 10/29/2016 at 12:28 pm to Dr. Lanelle Bal , who verbally acknowledged these results. Electronically Signed   By: Lowella Grip III M.D.   On: 10/29/2016 12:28   Dg Chest Port 1 View  Result Date: 10/31/2016 CLINICAL DATA:  Right sided pneumothorax. EXAM: PORTABLE CHEST 1 VIEW COMPARISON:  10/30/2016 and 10/29/2016 FINDINGS: Stable position of the pigtail drain at the base of the right chest.  There is concern for a residual pleural pocket containing gas at the lateral base of the right chest. Right upper lung is clear. Trachea is midline. Heart and mediastinum are stable. Left lung is clear. IMPRESSION: Stable position of the right chest drain. Small residual pneumothorax at the right lung base. Electronically Signed   By: Markus Daft M.D.   On: 10/31/2016 08:16   Dg Chest Port 1 View  Result Date: 10/30/2016 CLINICAL DATA:  Chest tube placement. EXAM: PORTABLE CHEST 1 VIEW COMPARISON:  Chest x-ray from yesterday. FINDINGS: Interval placement of a right pigtail chest tube with resolution of the right-sided hydropneumothorax. Trachea is midline. Subtle patchy density in the right lower lobe may be related to re-expansion edema. The left lung is clear. The cardiomediastinal silhouette is normal in size. Prior aortic valve replacement. IMPRESSION: Interval placement of a right pigtail chest tube with resolution of right-sided hydropneumothorax. Subtle patchy density in the right lower lobe may be related to re-expansion edema. Electronically Signed   By: Titus Dubin M.D.   On: 10/30/2016 19:49   Ct Image Guided Drainage By Percutaneous Catheter  Result Date: 10/30/2016 INDICATION: Right hydropneumothorax, shortness of breath, chest discomfort EXAM: CT RIGHT CHEST TUBE INSERTION MEDICATIONS: 1% LIDOCAINE LOCAL ANESTHESIA/SEDATION: Fentanyl 50 mcg IV; Versed 1.0 mg IV Moderate Sedation Time:  10 MINUTES The patient was continuously monitored during the procedure by the interventional radiology nurse under my direct supervision. COMPLICATIONS: None immediate. PROCEDURE: Informed written consent was obtained from the patient after a thorough discussion of the procedural risks, benefits and alternatives. All questions were addressed. Maximal Sterile Barrier Technique was utilized including caps, mask, sterile gowns, sterile gloves, sterile drape, hand hygiene and skin antiseptic. A timeout was  performed prior to the initiation of the procedure. Patient positioned supine. Noncontrast localization CT performed. The right hydropneumothorax was localized inferiorly. Right lower intercostal space marked in the mid axillary line. Under sterile conditions and local anesthesia, CT guidance was utilized to advance an 18 gauge 10 cm introducer needle into the right hydropneumothorax inferiorly. Needle position confirmed with CT. Amplatz guidewire inserted. Tract dilatation performed to insert a 14 French drain. Retention loop formed within the pleural effusion. Catheter position confirmed with CT. 30 cc bloody fluid aspirated. Sample sent for culture. Catheter secured with a Prolene suture and connected to external Pleur-Evac. Sterile dressing applied. No immediate complication. Patient tolerated the procedure well. IMPRESSION: Successful CT-guided right chest 14 French drain insertion for a persistent right hydropneumothorax. Electronically Signed   By: Jerilynn Mages.  Shick M.D.   On: 10/30/2016 09:55    Labs:  CBC:  Recent Labs  10/07/16 0812 10/14/16 1147 10/29/16 1650 10/31/16 0628  WBC 8.3 11.4* 11.5* 8.1  HGB 10.4* 10.3* 11.5* 10.5*  HCT 32.0* 33.1* 35.4* 34.1*  PLT 202 190 158 142*    COAGS:  Recent Labs  09/11/16 1046 09/15/16 1530  09/16/16 0045 09/16/16 0114 09/17/16 0251 10/30/16 0636  INR 1.01 1.54  < >  1.33 1.35 1.11 1.15  APTT 27 36  --   --  33  --  31  < > = values in this interval not displayed.  BMP:  Recent Labs  10/14/16 0804 10/14/16 1147 10/29/16 1650 10/31/16 0628  NA 137 133* 134* 134*  K 4.0 4.1 4.4 3.7  CL 97 98* 95* 97*  CO2 22 20* 27 26  GLUCOSE 104* 102* 97 106*  BUN 11 11 9 7   CALCIUM 9.1 9.0 9.4 8.8*  CREATININE 0.95 1.05 0.99 0.75  GFRNONAA 83 >60 >60 >60  GFRAA 96 >60 >60 >60    LIVER FUNCTION TESTS:  Recent Labs  09/18/16 0420 09/19/16 0423 10/14/16 1147 10/29/16 1650  BILITOT 1.5* 0.9 1.3* 0.8  AST 41 36 42* 30  ALT 17 19 63 32    ALKPHOS 31* 37* 130* 88  PROT 5.3* 5.9* 7.5 7.5  ALBUMIN 3.0* 3.2* 3.1* 3.5    Assessment and Plan: Pt s/p right chest tube 10/5 for ex vacuo ptx post thoracentesis(9/28); afebrile; WBC nl; hgb stable; creat nl; fluid cx neg to date; CXR today with small residual rt basilar ptx, stable tube position; tube still to wall suction; per TCTS plan is to place tube to water seal; check am CXR   Electronically Signed: D. Rowe Robert, PA-C 10/31/2016, 12:02 PM   I spent a total of 15 minutes at the the patient's bedside AND on the patient's hospital floor or unit, greater than 50% of which was counseling/coordinating care for right chest tube    Patient ID: Lonnie Hansen, male   DOB: 18-Jan-1951, 66 y.o.   MRN: [id number]

## 2016-10-31 NOTE — H&P (Signed)
Pt has refused morning weight and vitals. He says it is too early and he would like to sleep.  Will continue to monitor for safety.  Will re-attempt at a later time.

## 2016-11-01 ENCOUNTER — Inpatient Hospital Stay (HOSPITAL_COMMUNITY): Payer: Medicare Other

## 2016-11-01 NOTE — Progress Notes (Addendum)
      [address]       [place] [address]             [phone]           Subjective: Patient with occasional cough, productive. He also had an episode yesterday of pain on the left side of his chest, which he thought might be gas.  Objective: Vital signs in last 24 hours: Temp:  [98.2 F (36.8 C)-99.2 F (37.3 C)] 98.2 F (36.8 C) (10/07 0400) Pulse Rate:  [68-80] 68 (10/07 0400) Cardiac Rhythm: Normal sinus rhythm;Heart block (10/07 0701) Resp:  [16-20] 16 (10/07 0400) BP: (103-129)/(75-94) 114/85 (10/07 0400) SpO2:  [99 %-100 %] 100 % (10/07 0400) Weight:  [189 lb (85.7 kg)] 189 lb (85.7 kg) (10/07 0400)     Intake/Output from previous day: 10/06 0701 - 10/07 0700 In: 360 [P.O.:360] Out: 1270 [Urine:1200; Chest Tube:70]   Physical Exam:  Cardiovascular: RRR Pulmonary: Clear to auscultation on the left and slightly diminished at the right base Abdomen: Soft, non tender, bowel sounds present. Extremities: Trace bilateral lower extremity edema. Wounds: Clean and dry.  No erythema or signs of infection. Chest Tube: to suction, no air leak  Lab Results: CBC:  Recent Labs  10/29/16 1650 10/31/16 0628  WBC 11.5* 8.1  HGB 11.5* 10.5*  HCT 35.4* 34.1*  PLT 158 142*   BMET:   Recent Labs  10/29/16 1650 10/31/16 0628  NA 134* 134*  K 4.4 3.7  CL 95* 97*  CO2 27 26  GLUCOSE 97 106*  BUN 9 7  CREATININE 0.99 0.75  CALCIUM 9.4 8.8*    PT/INR:   Recent Labs  10/30/16 0636  LABPROT 14.6  INR 1.15   ABG:  INR: Will add last result for INR, ABG once components are confirmed Will add last 4 CBG results once components are confirmed  Assessment/Plan:  1. CV - SR. On Atenolol 12.5 mg bid and Amlodipine 10 mg daily. 2.  Pulmonary - Pigtail catheter with 70 cc since placement yesterday. There is no air leak. Pigtail  tube is to suction this am. CXR this am appears stable (patient is rotated to the left). Check CXR in am. As  discussed with Dr. Prescott Gum, pigtail  tube to remain to suction. Right pleural fluid culture results pending. 3. Anemia-H and H 10.5 and 34.1 4. Mild thrombocytopenia-platelets 142,000 5. GI-per speech, mild aspiration risk.  ZIMMERMAN,DONIELLE MPA-C 11/01/2016,7:45 AM  patient examined and medical record reviewed,agree with above note. Tharon Aquas Trigt III 11/01/2016

## 2016-11-02 ENCOUNTER — Inpatient Hospital Stay (HOSPITAL_COMMUNITY): Payer: Medicare Other

## 2016-11-02 LAB — BODY FLUID CULTURE
Culture: NO GROWTH
Special Requests: NORMAL

## 2016-11-02 NOTE — Progress Notes (Signed)
Referring Physician(s): Gerhardt,E  Supervising Physician: Dr. Earleen Newport  Patient Status:  [facility] - In-pt  Chief Complaint: Right hydropneumothorax   Subjective: Pt without acute changes; Less sore this am.   Allergies: Patient has no known allergies.  Medications:  Current Facility-Administered Medications:  .  0.9 %  sodium chloride infusion, 250 mL, Intravenous, PRN, Barrett, Erin R, PA-C .  acetaminophen (TYLENOL) tablet 650 mg, 650 mg, Oral, Q6H PRN **OR** acetaminophen (TYLENOL) suppository 650 mg, 650 mg, Rectal, Q6H PRN, Barrett, Erin R, PA-C .  amLODipine (NORVASC) tablet 10 mg, 10 mg, Oral, Daily, Barrett, Erin R, PA-C, 10 mg at 11/01/16 1041 .  aspirin EC tablet 81 mg, 81 mg, Oral, Daily, Barrett, Erin R, PA-C, 81 mg at 11/01/16 1041 .  atenolol (TENORMIN) tablet 12.5 mg, 12.5 mg, Oral, BID, Barrett, Erin R, PA-C, 12.5 mg at 11/01/16 2122 .  docusate sodium (COLACE) capsule 100 mg, 100 mg, Oral, BID, Barrett, Erin R, PA-C, 100 mg at 11/01/16 2122 .  enoxaparin (LOVENOX) injection 40 mg, 40 mg, Subcutaneous, Q24H, Barrett, Erin R, PA-C, 40 mg at 11/01/16 1909 .  furosemide (LASIX) tablet 20 mg, 20 mg, Oral, Daily, Barrett, Erin R, PA-C, 20 mg at 11/01/16 1041 .  latanoprost (XALATAN) 0.005 % ophthalmic solution 1 drop, 1 drop, Both Eyes, QHS, Barrett, Erin R, PA-C, 1 drop at 11/01/16 2122 .  levalbuterol (XOPENEX) nebulizer solution 0.63 mg, 0.63 mg, Nebulization, Q6H PRN, Barrett, Erin R, PA-C .  ondansetron (ZOFRAN) tablet 4 mg, 4 mg, Oral, Q6H PRN **OR** ondansetron (ZOFRAN) injection 4 mg, 4 mg, Intravenous, Q6H PRN, Barrett, Erin R, PA-C .  pantoprazole (PROTONIX) EC tablet 40 mg, 40 mg, Oral, Daily, Barrett, Erin R, PA-C, 40 mg at 11/01/16 1041 .  sodium chloride flush (NS) 0.9 % injection 3 mL, 3 mL, Intravenous, Q12H, Barrett, Erin R, PA-C, 3 mL at 11/01/16 2123 .  sodium chloride flush (NS) 0.9 % injection 3 mL, 3 mL, Intravenous, PRN, Barrett, Erin R, PA-C, 3 mL  at 10/30/16 1027 .  sodium chloride flush (NS) 0.9 % injection 5 mL, 5 mL, Intravenous, Q8H, Greggory Keen, MD, 5 mL at 11/01/16 1040 .  traMADol (ULTRAM) tablet 50 mg, 50 mg, Oral, Q6H PRN, Barrett, Erin R, PA-C, 50 mg at 11/01/16 2153    Vital Signs: BP 100/67 (BP Location: Left Arm)   Pulse 66   Temp 98.4 F (36.9 C) (Oral)   Resp 16   Ht 5\' 9"  (1.753 m)   Wt 183 lb 3.2 oz (83.1 kg)   SpO2 98%   BMI 27.05 kg/m   Physical Exam awake/alert; rt chest tube intact, no air leak, fluid output more serosanguinous now  Imaging: Dg Chest 2 View  Result Date: 10/29/2016 CLINICAL DATA:  Shortness of breath.  Prior pneumothorax. EXAM: CHEST  2 VIEW COMPARISON:  August 22, 2016 FINDINGS: There is again noted a pneumothorax on the right which appears marginally larger compared to previous study. There is a new moderate pleural effusion on the right. There may be a minimal tension component with each equivocal shift of heart and mediastinum toward the left, stable. Left lung is clear. The heart size is normal. Pulmonary vascularity on the left is within normal limits. The pulmonary vascularity on the right is mildly distorted due to the pneumothorax. There is an aortic valve replacement. The aorta is prominent but stable. No bone lesions. No adenopathy. IMPRESSION: Right-sided pneumothorax marginally larger. Moderate pleural effusion now present on the right.  Question mild tension component. Left lung clear. Stable cardiac silhouette. Prominence of the aorta is likely reflective of aortic valve disease. There is an aortic valve replacement. Critical Value/emergent results were called by telephone at the time of interpretation on 10/29/2016 at 12:28 pm to Dr. Lanelle Bal , who verbally acknowledged these results. Electronically Signed   By: Lowella Grip III M.D.   On: 10/29/2016 12:28   Dg Chest Port 1 View  Result Date: 11/02/2016 CLINICAL DATA:  Pneumothorax. EXAM: PORTABLE CHEST 1 VIEW  COMPARISON:  Radiograph November 01, 2016. FINDINGS: Stable cardiomediastinal silhouette. Status post aortic valve replacement. Right-sided chest tube is unchanged in position. Mild right basilar pneumothorax is noted and stable. Left lung is clear. Minimal right pleural effusion is noted. Bony thorax is unremarkable. IMPRESSION: Right-sided chest tube unchanged in position. Stable mild right basilar pneumothorax is noted with minimal right pleural effusion. Electronically Signed   By: Marijo Conception, M.D.   On: 11/02/2016 07:27   Dg Chest Port 1 View  Result Date: 11/01/2016 CLINICAL DATA:  Followup right chest tube.  Pneumothorax. EXAM: PORTABLE CHEST 1 VIEW COMPARISON:  10/31/2016 FINDINGS: Right inferior hemithorax chest tube is stable. There is persistent opacity at the right lung base consistent with a small effusion and atelectasis. Lungs are otherwise clear. No pneumothorax. Small residual lung base pneumothorax noted on the previous day's study is not defined on the current exam. Stable changes from prior cardiac surgery. IMPRESSION: 1. No convincing residual pneumothorax. 2. Stable right chest tube. Persistent small right pleural effusion with right lung base atelectasis. Electronically Signed   By: Lajean Manes M.D.   On: 11/01/2016 08:31   Dg Chest Port 1 View  Result Date: 10/31/2016 CLINICAL DATA:  Right sided pneumothorax. EXAM: PORTABLE CHEST 1 VIEW COMPARISON:  10/30/2016 and 10/29/2016 FINDINGS: Stable position of the pigtail drain at the base of the right chest. There is concern for a residual pleural pocket containing gas at the lateral base of the right chest. Right upper lung is clear. Trachea is midline. Heart and mediastinum are stable. Left lung is clear. IMPRESSION: Stable position of the right chest drain. Small residual pneumothorax at the right lung base. Electronically Signed   By: Markus Daft M.D.   On: 10/31/2016 08:16   Dg Chest Port 1 View  Result Date:  10/30/2016 CLINICAL DATA:  Chest tube placement. EXAM: PORTABLE CHEST 1 VIEW COMPARISON:  Chest x-ray from yesterday. FINDINGS: Interval placement of a right pigtail chest tube with resolution of the right-sided hydropneumothorax. Trachea is midline. Subtle patchy density in the right lower lobe may be related to re-expansion edema. The left lung is clear. The cardiomediastinal silhouette is normal in size. Prior aortic valve replacement. IMPRESSION: Interval placement of a right pigtail chest tube with resolution of right-sided hydropneumothorax. Subtle patchy density in the right lower lobe may be related to re-expansion edema. Electronically Signed   By: Titus Dubin M.D.   On: 10/30/2016 19:49   Ct Image Guided Drainage By Percutaneous Catheter  Result Date: 10/30/2016 INDICATION: Right hydropneumothorax, shortness of breath, chest discomfort EXAM: CT RIGHT CHEST TUBE INSERTION MEDICATIONS: 1% LIDOCAINE LOCAL ANESTHESIA/SEDATION: Fentanyl 50 mcg IV; Versed 1.0 mg IV Moderate Sedation Time:  10 MINUTES The patient was continuously monitored during the procedure by the interventional radiology nurse under my direct supervision. COMPLICATIONS: None immediate. PROCEDURE: Informed written consent was obtained from the patient after a thorough discussion of the procedural risks, benefits and alternatives. All questions were addressed.  Maximal Sterile Barrier Technique was utilized including caps, mask, sterile gowns, sterile gloves, sterile drape, hand hygiene and skin antiseptic. A timeout was performed prior to the initiation of the procedure. Patient positioned supine. Noncontrast localization CT performed. The right hydropneumothorax was localized inferiorly. Right lower intercostal space marked in the mid axillary line. Under sterile conditions and local anesthesia, CT guidance was utilized to advance an 18 gauge 10 cm introducer needle into the right hydropneumothorax inferiorly. Needle position confirmed  with CT. Amplatz guidewire inserted. Tract dilatation performed to insert a 14 French drain. Retention loop formed within the pleural effusion. Catheter position confirmed with CT. 30 cc bloody fluid aspirated. Sample sent for culture. Catheter secured with a Prolene suture and connected to external Pleur-Evac. Sterile dressing applied. No immediate complication. Patient tolerated the procedure well. IMPRESSION: Successful CT-guided right chest 14 French drain insertion for a persistent right hydropneumothorax. Electronically Signed   By: Jerilynn Mages.  Shick M.D.   On: 10/30/2016 09:55    Labs:  CBC:  Recent Labs  10/07/16 0812 10/14/16 1147 10/29/16 1650 10/31/16 0628  WBC 8.3 11.4* 11.5* 8.1  HGB 10.4* 10.3* 11.5* 10.5*  HCT 32.0* 33.1* 35.4* 34.1*  PLT 202 190 158 142*    COAGS:  Recent Labs  09/11/16 1046 09/15/16 1530  09/16/16 0045 09/16/16 0114 09/17/16 0251 10/30/16 0636  INR 1.01 1.54  < > 1.33 1.35 1.11 1.15  APTT 27 36  --   --  33  --  31  < > = values in this interval not displayed.  BMP:  Recent Labs  10/14/16 0804 10/14/16 1147 10/29/16 1650 10/31/16 0628  NA 137 133* 134* 134*  K 4.0 4.1 4.4 3.7  CL 97 98* 95* 97*  CO2 22 20* 27 26  GLUCOSE 104* 102* 97 106*  BUN 11 11 9 7   CALCIUM 9.1 9.0 9.4 8.8*  CREATININE 0.95 1.05 0.99 0.75  GFRNONAA 83 >60 >60 >60  GFRAA 96 >60 >60 >60    LIVER FUNCTION TESTS:  Recent Labs  09/18/16 0420 09/19/16 0423 10/14/16 1147 10/29/16 1650  BILITOT 1.5* 0.9 1.3* 0.8  AST 41 36 42* 30  ALT 17 19 63 32  ALKPHOS 31* 37* 130* 88  PROT 5.3* 5.9* 7.5 7.5  ALBUMIN 3.0* 3.2* 3.1* 3.5    Assessment and Plan: Pt s/p right chest tube 10/5 for ex vacuo ptx post thoracentesis(9/28); afebrile; WBC nl; hgb stable; creat nl; fluid cx neg to date;  Tube still to wall suction; plans per TCTS   Electronically Signed: Ascencion Dike, PA-C 11/02/2016, 9:35 AM   I spent a total of 15 minutes at the the patient's bedside AND  on the patient's hospital floor or unit, greater than 50% of which was counseling/coordinating care for right chest tube    Patient ID: Lonnie Hansen, male   DOB: [birth year], 66 y.o.   MRN: [id number]

## 2016-11-02 NOTE — Care Management Important Message (Signed)
Important Message  Patient Details  Name: Lonnie Hansen MRN: [id number] Date of Birth: Apr 06, 1950   Medicare Important Message Given:  Yes    Nathen May 11/02/2016, 8:33 AM

## 2016-11-02 NOTE — Progress Notes (Signed)
Patient ambulated in hallway with nursing staff and walker. Will monitor patient. Margaurite Salido, Bettina Gavia RN

## 2016-11-02 NOTE — Progress Notes (Addendum)
      [address]       [name]             [phone]      Subjective:  Lonnie Hansen has several concerns this morning.  He is concerned why this happened and doesn't want to leave until this has resolved so he doesn't have to come back.  + ambulation  Objective: Vital signs in last 24 hours: Temp:  [98.4 F (36.9 C)-98.6 F (37 C)] 98.4 F (36.9 C) (10/08 0446) Pulse Rate:  [66-84] 66 (10/08 0446) Cardiac Rhythm: Heart block (10/08 0700) Resp:  [15-21] 16 (10/08 0446) BP: (100-106)/(67-80) 100/67 (10/08 0446) SpO2:  [98 %-100 %] 98 % (10/08 0446) Weight:  [183 lb 3.2 oz (83.1 kg)] 183 lb 3.2 oz (83.1 kg) (10/08 0446)  Intake/Output from previous day: 10/07 0701 - 10/08 0700 In: 960 [P.O.:960] Out: 1090 [Urine:1050; Chest Tube:40]  General appearance: alert, cooperative and no distress Heart: regular rate and rhythm Lungs: diminished breath sounds right base Abdomen: soft, non-tender; bowel sounds normal; no masses,  no organomegaly Extremities: extremities normal, atraumatic, no cyanosis or edema Wound: clean and dry  Lab Results:  Recent Labs  10/31/16 0628  WBC 8.1  HGB 10.5*  HCT 34.1*  PLT 142*   BMET:  Recent Labs  10/31/16 0628  NA 134*  K 3.7  CL 97*  CO2 26  GLUCOSE 106*  BUN 7  CREATININE 0.75  CALCIUM 8.8*    PT/INR: No results for input(s): LABPROT, INR in the last 72 hours. ABG    Component Value Date/Time   PHART 7.483 (H) 09/17/2016 1452   HCO3 26.6 09/17/2016 1452   TCO2 28 09/17/2016 1452   ACIDBASEDEF 1.0 09/16/2016 1048   O2SAT 59.9 09/20/2016 0355   CBG (last 3)  No results for input(s): GLUCAP in the last 72 hours.  Assessment/Plan:  1. CV- Hemodynamically stable, BP controlled- continue Lopressor, Norvasc 2. Pulm- no acute issues, not on oxygen, CXR with stable appearance of right basilar pneumothorax, small effusion... Chest tube no air leak, leave on suction, 10 cc output yesterday 3. GI-  continue regular diet/liquids.... Per SLP mild aspiration risk 4. Dispo- patient stable, chest tube management per Dr. Servando Snare  LOS: 4 days    Lonnie Hansen 11/02/2016  Feels better, says he has turned the corner with is swallowing  Ct to water seal this afternoon and d/c in am  I have seen and examined Lonnie Hansen and agree with the above assessment  and plan.  Lonnie Isaac MD Beeper [phone] Office [phone] 11/02/2016 3:02 PM

## 2016-11-03 ENCOUNTER — Inpatient Hospital Stay (HOSPITAL_COMMUNITY): Payer: Medicare Other

## 2016-11-03 NOTE — Progress Notes (Signed)
Patient up to chairx1 today will monitor patient. Leonora Gores, Bettina Gavia rN

## 2016-11-03 NOTE — Progress Notes (Signed)
Patient ID: Lonnie Hansen, male   DOB: 1950-10-02, 66 y.o.   MRN: [id number]    Referring Physician(s): Dr. Lanelle Bal  Supervising Physician: Aletta Edouard  Patient Status: [facility] - In-pt  Chief Complaint: Right PTX  Subjective: Patient is feeling well, tired because his machine was beeping all night.  Allergies: Patient has no known allergies.  Medications: Prior to Admission medications   Medication Sig Start Date End Date Taking? Authorizing Provider  amLODipine (NORVASC) 10 MG tablet Take 10 mg by mouth daily.   Yes [provider]  aspirin EC 81 MG EC tablet Take 1 tablet (81 mg total) by mouth daily. 09/24/16  Yes Gold, Wayne E, PA-C  atenolol (TENORMIN) 25 MG tablet Take 0.5 tablets (12.5 mg total) by mouth 2 (two) times daily. 09/23/16  Yes Gold, Patrick Jupiter E, PA-C  folic acid (FOLVITE) 1 MG tablet Take 1 tablet (1 mg total) by mouth daily. 09/24/16  Yes Gold, Wayne E, PA-C  furosemide (LASIX) 20 MG tablet Take 1 tablet (20 mg total) by mouth daily. 10/06/16 01/04/17 Yes Almyra Deforest, PA  latanoprost (XALATAN) 0.005 % ophthalmic solution Place 1 drop into both eyes at bedtime.   Yes [provider]  Multiple Vitamin (MULTIVITAMIN WITH MINERALS) TABS tablet Take 1 tablet by mouth daily.   Yes [provider]  pantoprazole (PROTONIX) 40 MG tablet Take 40 mg by mouth daily as needed (indigestion).    Yes [provider]  traMADol (ULTRAM) 50 MG tablet Take 1 tablet (50 mg total) by mouth every 6 (six) hours as needed for moderate pain. 09/23/16  Yes Gold, Wilder Glade, PA-C  amiodarone (PACERONE) 200 MG tablet Take 1 tablet (200 mg total) by mouth 2 (two) times daily after a meal. Patient not taking: Reported on 10/30/2016 09/23/16   Jadene Pierini E, PA-C  atorvastatin (LIPITOR) 40 MG tablet Take 1 tablet (40 mg total) by mouth daily. Patient not taking: Reported on 10/29/2016 10/06/16 01/04/17  Almyra Deforest, PA  Ferrous Sulfate (IRON) 325 (65 Fe) MG TABS Take 1  tablet (325 mg total) by mouth 2 (two) times daily with a meal. Patient not taking: Reported on 10/29/2016 10/02/16   Grace Isaac, MD    Vital Signs: BP 104/74 (BP Location: Left Arm)   Pulse 67   Temp 98 F (36.7 C) (Oral)   Resp 17   Ht 5\' 9"  (1.753 m)   Wt 184 lb (83.5 kg)   SpO2 100%   BMI 27.17 kg/m   Physical Exam: Chest: chest tube with no air leak.  Chest tube removed in its entirety with no issues.  Occlusive dressing placed over site.  Imaging: Dg Chest Port 1 View  Result Date: 11/03/2016 CLINICAL DATA:  Hydropneumothorax EXAM: PORTABLE CHEST 1 VIEW COMPARISON:  11/02/2016 FINDINGS: Right chest tube remains in place with stable right hydropneumothorax. Pneumothorax component best seen at the right base. Left lung is clear. Heart is normal size. Prior aortic valve replacement. IMPRESSION: Stable small right basilar pneumothorax and right effusion. Electronically Signed   By: Rolm Baptise M.D.   On: 11/03/2016 07:22   Dg Chest Port 1 View  Result Date: 11/02/2016 CLINICAL DATA:  Pneumothorax. EXAM: PORTABLE CHEST 1 VIEW COMPARISON:  Radiograph November 01, 2016. FINDINGS: Stable cardiomediastinal silhouette. Status post aortic valve replacement. Right-sided chest tube is unchanged in position. Mild right basilar pneumothorax is noted and stable. Left lung is clear. Minimal right pleural effusion is noted. Bony thorax is unremarkable. IMPRESSION: Right-sided  chest tube unchanged in position. Stable mild right basilar pneumothorax is noted with minimal right pleural effusion. Electronically Signed   By: Marijo Conception, M.D.   On: 11/02/2016 07:27   Dg Chest Port 1 View  Result Date: 11/01/2016 CLINICAL DATA:  Followup right chest tube.  Pneumothorax. EXAM: PORTABLE CHEST 1 VIEW COMPARISON:  10/31/2016 FINDINGS: Right inferior hemithorax chest tube is stable. There is persistent opacity at the right lung base consistent with a small effusion and atelectasis. Lungs are otherwise  clear. No pneumothorax. Small residual lung base pneumothorax noted on the previous day's study is not defined on the current exam. Stable changes from prior cardiac surgery. IMPRESSION: 1. No convincing residual pneumothorax. 2. Stable right chest tube. Persistent small right pleural effusion with right lung base atelectasis. Electronically Signed   By: Lajean Manes M.D.   On: 11/01/2016 08:31   Dg Chest Port 1 View  Result Date: 10/31/2016 CLINICAL DATA:  Right sided pneumothorax. EXAM: PORTABLE CHEST 1 VIEW COMPARISON:  10/30/2016 and 10/29/2016 FINDINGS: Stable position of the pigtail drain at the base of the right chest. There is concern for a residual pleural pocket containing gas at the lateral base of the right chest. Right upper lung is clear. Trachea is midline. Heart and mediastinum are stable. Left lung is clear. IMPRESSION: Stable position of the right chest drain. Small residual pneumothorax at the right lung base. Electronically Signed   By: Markus Daft M.D.   On: 10/31/2016 08:16   Dg Chest Port 1 View  Result Date: 10/30/2016 CLINICAL DATA:  Chest tube placement. EXAM: PORTABLE CHEST 1 VIEW COMPARISON:  Chest x-ray from yesterday. FINDINGS: Interval placement of a right pigtail chest tube with resolution of the right-sided hydropneumothorax. Trachea is midline. Subtle patchy density in the right lower lobe may be related to re-expansion edema. The left lung is clear. The cardiomediastinal silhouette is normal in size. Prior aortic valve replacement. IMPRESSION: Interval placement of a right pigtail chest tube with resolution of right-sided hydropneumothorax. Subtle patchy density in the right lower lobe may be related to re-expansion edema. Electronically Signed   By: Titus Dubin M.D.   On: 10/30/2016 19:49    Labs:  CBC:  Recent Labs  10/07/16 0812 10/14/16 1147 10/29/16 1650 10/31/16 0628  WBC 8.3 11.4* 11.5* 8.1  HGB 10.4* 10.3* 11.5* 10.5*  HCT 32.0* 33.1* 35.4* 34.1*    PLT 202 190 158 142*    COAGS:  Recent Labs  09/11/16 1046 09/15/16 1530  09/16/16 0045 09/16/16 0114 09/17/16 0251 10/30/16 0636  INR 1.01 1.54  < > 1.33 1.35 1.11 1.15  APTT 27 36  --   --  33  --  31  < > = values in this interval not displayed.  BMP:  Recent Labs  10/14/16 0804 10/14/16 1147 10/29/16 1650 10/31/16 0628  NA 137 133* 134* 134*  K 4.0 4.1 4.4 3.7  CL 97 98* 95* 97*  CO2 22 20* 27 26  GLUCOSE 104* 102* 97 106*  BUN 11 11 9 7   CALCIUM 9.1 9.0 9.4 8.8*  CREATININE 0.95 1.05 0.99 0.75  GFRNONAA 83 >60 >60 >60  GFRAA 96 >60 >60 >60    LIVER FUNCTION TESTS:  Recent Labs  09/18/16 0420 09/19/16 0423 10/14/16 1147 10/29/16 1650  BILITOT 1.5* 0.9 1.3* 0.8  AST 41 36 42* 30  ALT 17 19 63 32  ALKPHOS 31* 37* 130* 88  PROT 5.3* 5.9* 7.5 7.5  ALBUMIN 3.0*  3.2* 3.1* 3.5    Assessment and Plan: 1. Right PTX, s/p thoracentesis, s/p chest tube placement  Chest tube removed today at the request of TCTS.  Removed with no issues. Further plans and care per TCTS.  Electronically Signed: Henreitta Cea 11/03/2016, 10:32 AM   I spent a total of 15 Minutes at the the patient's bedside AND on the patient's hospital floor or unit, greater than 50% of which was counseling/coordinating care for right PTX

## 2016-11-03 NOTE — Progress Notes (Addendum)
[address]       [place] [address]             [phone]         Subjective: Feels well   Objective: Vital signs in last 24 hours: Temp:  [98 F (36.7 C)-98.9 F (37.2 C)] 98 F (36.7 C) (10/09 0435) Pulse Rate:  [67-76] 67 (10/09 0435) Cardiac Rhythm: Heart block;Normal sinus rhythm (10/08 1900) Resp:  [12-18] 17 (10/09 0435) BP: (104-109)/(70-85) 104/74 (10/09 0435) SpO2:  [100 %] 100 % (10/09 0435) Weight:  [184 lb (83.5 kg)] 184 lb (83.5 kg) (10/09 0619)  Hemodynamic parameters for last 24 hours:    Intake/Output from previous day: 10/08 0701 - 10/09 0700 In: 240 [P.O.:240] Out: 870 [Urine:750; Chest Tube:120] Intake/Output this shift: No intake/output data recorded.  General appearance: alert, cooperative and no distress Heart: regular rate and rhythm Lungs: clear to auscultation bilaterally Abdomen: benign Extremities: no edema Wound: incis well healed  Lab Results: No results for input(s): WBC, HGB, HCT, PLT in the last 72 hours. BMET: No results for input(s): NA, K, CL, CO2, GLUCOSE, BUN, CREATININE, CALCIUM in the last 72 hours.  PT/INR: No results for input(s): LABPROT, INR in the last 72 hours. ABG    Component Value Date/Time   PHART 7.483 (H) 09/17/2016 1452   HCO3 26.6 09/17/2016 1452   TCO2 28 09/17/2016 1452   ACIDBASEDEF 1.0 09/16/2016 1048   O2SAT 59.9 09/20/2016 0355   CBG (last 3)  No results for input(s): GLUCAP in the last 72 hours.  Meds Scheduled Meds: . amLODipine  10 mg Oral Daily  . aspirin EC  81 mg Oral Daily  . atenolol  12.5 mg Oral BID  . docusate sodium  100 mg Oral BID  . enoxaparin (LOVENOX) injection  40 mg Subcutaneous Q24H  . furosemide  20 mg Oral Daily  . latanoprost  1 drop Both Eyes QHS  . pantoprazole  40 mg Oral Daily  . sodium chloride flush  3 mL Intravenous Q12H  . sodium chloride flush  5 mL Intravenous Q8H   Continuous Infusions: . sodium chloride     PRN Meds:.sodium  chloride, acetaminophen **OR** acetaminophen, levalbuterol, ondansetron **OR** ondansetron (ZOFRAN) IV, sodium chloride flush, traMADol  Xrays Dg Chest Port 1 View  Result Date: 11/03/2016 CLINICAL DATA:  Hydropneumothorax EXAM: PORTABLE CHEST 1 VIEW COMPARISON:  11/02/2016 FINDINGS: Right chest tube remains in place with stable right hydropneumothorax. Pneumothorax component best seen at the right base. Left lung is clear. Heart is normal size. Prior aortic valve replacement. IMPRESSION: Stable small right basilar pneumothorax and right effusion. Electronically Signed   By: Rolm Baptise M.D.   On: 11/03/2016 07:22   Dg Chest Port 1 View  Result Date: 11/02/2016 CLINICAL DATA:  Pneumothorax. EXAM: PORTABLE CHEST 1 VIEW COMPARISON:  Radiograph November 01, 2016. FINDINGS: Stable cardiomediastinal silhouette. Status post aortic valve replacement. Right-sided chest tube is unchanged in position. Mild right basilar pneumothorax is noted and stable. Left lung is clear. Minimal right pleural effusion is noted. Bony thorax is unremarkable. IMPRESSION: Right-sided chest tube unchanged in position. Stable mild right basilar pneumothorax is noted with minimal right pleural effusion. Electronically Signed   By: Marijo Conception, M.D.   On: 11/02/2016 07:27   Dg Chest Port 1 View  Result Date: 11/01/2016 CLINICAL DATA:  Followup right chest tube.  Pneumothorax. EXAM: PORTABLE CHEST 1 VIEW COMPARISON:  10/31/2016 FINDINGS: Right inferior hemithorax  chest tube is stable. There is persistent opacity at the right lung base consistent with a small effusion and atelectasis. Lungs are otherwise clear. No pneumothorax. Small residual lung base pneumothorax noted on the previous day's study is not defined on the current exam. Stable changes from prior cardiac surgery. IMPRESSION: 1. No convincing residual pneumothorax. 2. Stable right chest tube. Persistent small right pleural effusion with right lung base atelectasis.  Electronically Signed   By: Lajean Manes M.D.   On: 11/01/2016 08:31    Assessment/Plan: S/P right pigtail chest tube, CXR is stable without air leak- d/c tube today hemodyn stable in sinus rhythm Routine pulm toilet/ambulation prob home in am    LOS: 5 days    Lonnie Hansen,Lonnie Hansen 11/03/2016  Chest tube out today, follow up chest xray in am  in am and prob d/c home   I have seen and examined [name] and agree with the above assessment  and plan.  Grace Isaac MD Beeper [phone] Office [phone] 11/03/2016 9:46 AM   Grace Isaac MD Beeper [phone] Office [phone] 11/03/2016 9:45 AM

## 2016-11-03 NOTE — Discharge Instructions (Signed)
Pneumothorax °A pneumothorax, commonly called a collapsed lung, is a condition in which air leaks from a lung and builds up in the space between the lung and the chest wall (pleural space). The air in a pneumothorax is trapped outside the lung and takes up space, preventing the lung from fully expanding. This is a condition that usually occurs suddenly. The buildup of air may be small or large. A small pneumothorax may go away on its own. When a pneumothorax is larger, it will often require medical treatment and hospitalization. °What are the causes? °A pneumothorax can sometimes happen quickly with no apparent cause. People with underlying lung problems, particularly COPD or emphysema, are at higher risk of pneumothorax. However, pneumothorax can happen quickly even in people with no prior known lung problems. Trauma, surgery, medical procedures, or injury to the chest wall can also cause a pneumothorax. °What are the signs or symptoms? °Sometimes a pneumothorax will have no symptoms. When symptoms are present, they can include: °· Chest pain. °· Shortness of breath. °· Increased rate of breathing. °· Bluish color to your lips or skin (cyanosis). ° °How is this diagnosed? °Pneumothorax is usually diagnosed by a chest X-ray or chest CT scan. Your health care provider will also take a medical history and perform a physical exam to determine why you may have a pneumothorax. °How is this treated? °A small pneumothorax may go away on its own without treatment. Extra oxygen can sometimes help a small pneumothorax go away more quickly. For a larger pneumothorax or a pneumothorax that is causing symptoms, a procedure is usually needed to drain the air. In some cases, the health care provider may drain the air using a needle. In other cases, a chest tube may be inserted into the pleural space. A chest tube is a small tube placed between the ribs and into the pleural space. This removes the extra air and allows the lung to  expand back to its normal size. A large pneumothorax will usually require a hospital stay. If there is ongoing air leakage into the pleural space, then the chest tube may need to remain in place for several days until the air leak has healed. In some cases, surgery may be needed. °Follow these instructions at home: °· Only take over-the-counter or prescription medicines as directed by your health care provider. °· If a cough or pain makes it difficult for you to sleep at night, try sleeping in a semi-upright position in a recliner or by using 2 or 3 pillows. °· Rest and limit activity as directed by your health care provider. °· If you had a chest tube and it was removed, ask your health care provider when it is okay to remove the dressing. Until your health care provider says you can remove the dressing, do not allow it to get wet. °· Do not smoke. Smoking is a risk factor for pneumothorax. °· Do not fly in an airplane or scuba dive until your health care provider says it is okay. °· Follow up with your health care provider as directed. °Get help right away if: °· You have increasing chest pain or shortness of breath. °· You have a cough that is not controlled with suppressants. °· You begin coughing up blood. °· You have pain that is getting worse or is not controlled with medicines. °· You cough up thick, discolored mucus (sputum) that is yellow to green in color. °· You have redness, increasing pain, or discharge at the site where a   chest tube had been in place (if your pneumothorax was treated with a chest tube). °· The site where your chest tube was located opens up. °· You feel air coming out of the site where the chest tube was placed. °· You have a fever or persistent symptoms for more than 2-3 days. °· You have a fever and your symptoms suddenly get worse. °This information is not intended to replace advice given to you by your health care provider. Make sure you discuss any questions you have with your  health care provider. °Document Released: 01/12/2005 Document Revised: 06/20/2015 Document Reviewed: 06/07/2013 °Elsevier Interactive Patient Education © 2018 Elsevier Inc. ° °

## 2016-11-03 NOTE — Care Management Note (Signed)
Case Management Note Original Note Created Carles Collet, RN 10/30/2016, 11:04 AM  Patient Details  Name: TORETTO TINGLER MRN: [id number] Date of Birth: [birth year]  Subjective/Objective:                 Patient from home w wife admitted from office to have IR place a pigtail catheter into the right chest to drain fluid and air. Was discharged two weeks ago with [facility].   Action/Plan: 11/03/16- Marvetta Gibbons RN, CM- call made to Colman Cater- with [facility]- confirmed pt was active with them PTA- for [facility] services- RN/PT/SLP- will need orders to resume [place] services on discharge- CM will continue to follow  Expected Discharge Date:                 Expected Discharge Plan:  [place]  In-House Referral:     Discharge planning Services  CM Consult  Post Acute Care Choice:  [place], Resumption of Svcs/PTA Provider Choice offered to:  Patient, Spouse  DME Arranged:    DME Agency:     HH Arranged:    [place] Agency:  [facility]  Status of Service:  In process, will continue to follow  If discussed at Long Length of Stay Meetings, dates discussed:    Discharge Disposition:   Additional Comments:  Dawayne Patricia, RN 11/03/2016, 10:26 AM [phone]

## 2016-11-03 NOTE — Progress Notes (Signed)
Patient ambulated in hallway with nursing staff. Will monitor patient. Lonnie Hansen, Bettina Gavia RN

## 2016-11-03 NOTE — Consult Note (Signed)
   [facility] CM Inpatient Consult   11/03/2016  Lonnie Hansen Oct 11, 1950 [id number]  Patient had been out reached in the past in the Medicare ACO.  Met with the patient at the bedside.  Patient endorses Dr. [name] as his primary care provider.  Spoke with patient about [facility] services for post hospital follow up.  Patient states he has a tele-monitoring program at home that monitors his weight.  He states he is "retired, and I don't have any money."  Explained how [facility] can assess for community resources.  Patient states, "well I am going to see how this home health situation works out."  Patient did accept a brochure, 24 hour nurse advise line magnet with contact information. No other needs identified.  For questions, please contact:  Natividad Brood, RN BSN [facility] Liaison  [phone] business mobile phone Toll free office [phone]

## 2016-11-03 NOTE — Progress Notes (Signed)
Pt concerned about not having lab work since 10-6. Concerned about K+ and Hgb. No current orders for any labs. CT MD Bartle called and stated that unless acute changes or emergent pt doesn't need labs and attending can Address it tomorrow. Will let patient know and have it addressed tomorrow. Will continue to monitor patient during night.

## 2016-11-03 NOTE — Discharge Summary (Signed)
Physician Discharge Summary  Patient ID: Lonnie Hansen MRN: [id number] DOB/AGE: [birth year] 67 y.o.  Admit date: 10/29/2016 Discharge date: 11/04/2016  Admission Diagnoses:Hydropneumothorax  Discharge Diagnoses:  Active Problems:   Hydropneumothorax  Patient Active Problem List   Diagnosis Date Noted  . Hydropneumothorax 10/29/2016  . S/P AVR 09/15/2016  . Coronary artery disease involving native coronary artery of native heart without angina pectoris 04/29/2016  . Aortic insufficiency 04/01/2016  . Thoracic aortic aneurysm ([place]) 09/18/2015  . HTN (hypertension) 09/18/2015  . Hyperlipidemia 09/18/2015  . Asymptomatic bilateral carotid artery stenosis 08/27/2015   History of Present Illness:    Patient returns office today for follow-up appointment after modified biologic [place] done August 21. Since surgery the patient had a thoracentesis done on the right, resulting in a right pneumothorax. He returns today with a follow-up x-ray that shows increasing pleural effusion and persistent right pneumothorax. The patient is mildly short of breath but in no distress. He also notes that he's had trouble swallowing since surgery a formal swallowing evaluation was performed without abnormality he has been seen in speech pathology rehabilitation.,   Discharged Condition: good  Hospital Course: The patient was admitted and a right-sided pigtail catheter was placed by interventional radiology to assist with drainage and the pneumothorax. He was monitored closely and serial chest x-rays were obtained. Appearance of the x-ray is much improved over time with drainage of the fluid and significant improvement in the pneumothorax with only a small residual in the base currently. The tube was removed on 11/03/2016. Repeat chest x-ray will be obtained and if no significant issues present the patient will be discharged.   Consults: IR  Significant Diagnostic Studies: serial CXR  Treatments: Chest tube  placement by interventional radiology  Discharge Exam: Blood pressure 97/68, pulse 69, temperature 98.1 F (36.7 C), temperature source Oral, resp. rate 18, height 5\' 9"  (1.753 m), weight 182 lb (82.6 kg), SpO2 100 %.  General appearance: alert, cooperative and no distress Heart: regular rate and rhythm Lungs: clear to auscultation bilaterally Abdomen: soft, non-tender; bowel sounds normal; no masses,  no organomegaly Extremities: no edema Wound: incis well healed   Disposition: 01-Home or Self Care   Allergies as of 11/04/2016   No Known Allergies     Medication List    STOP taking these medications   amiodarone 200 MG tablet Commonly known as:  PACERONE   atorvastatin 40 MG tablet Commonly known as:  LIPITOR   Iron 325 (65 Fe) MG Tabs     TAKE these medications   amLODipine 10 MG tablet Commonly known as:  NORVASC Take 10 mg by mouth daily.   aspirin 81 MG EC tablet Take 1 tablet (81 mg total) by mouth daily.   atenolol 25 MG tablet Commonly known as:  TENORMIN Take 0.5 tablets (12.5 mg total) by mouth 2 (two) times daily.   folic acid 1 MG tablet Commonly known as:  FOLVITE Take 1 tablet (1 mg total) by mouth daily.   furosemide 20 MG tablet Commonly known as:  LASIX Take 1 tablet (20 mg total) by mouth daily.   latanoprost 0.005 % ophthalmic solution Commonly known as:  XALATAN Place 1 drop into both eyes at bedtime.   multivitamin with minerals Tabs tablet Take 1 tablet by mouth daily.   pantoprazole 40 MG tablet Commonly known as:  PROTONIX Take 40 mg by mouth daily as needed (indigestion).   traMADol 50 MG tablet Commonly known as:  ULTRAM Take 1 tablet (  50 mg total) by mouth every 12 (twelve) hours as needed for moderate pain. What changed:  when to take this      Follow-up Information    Grace Isaac, MD Follow up.   Specialty:  Cardiothoracic Surgery Why:  Appointment to see Dr. Servando Snare on 11/19/2016 and 9:30 AM. Please obtain  a chest x-ray [place] [facility] at 9 AM. [facility] is located on the first floor in the same office complex. Contact information: [address] [place] [address] [phone]           Signed: [name] 11/04/2016, 10:01 AM

## 2016-11-03 NOTE — Progress Notes (Signed)
  Speech Language Pathology Treatment: Dysphagia  Patient Details Name: Lonnie Hansen MRN: [id number] DOB: 26-Jun-1950 Today's Date: 11/03/2016 Time: 5449-2010 SLP Time Calculation (min) (ACUTE ONLY): 8 min  Assessment / Plan / Recommendation Clinical Impression  Pt has made excellent progress and has met his dysphagia goals. Consumed thin water via straw and regular texture without s/s aspiration. No gagging observed as pt had significant difficulty with in the recent past. He states he was anxious and thought he "was unable to swallow because of the tube." Answered pt's questions. No further ST needed.   HPI HPI: Pt admitted for increased pleural effusion and is s/p right chest tube 10/5 for ex vacuo ptx post thoracentesis. The patient is a 66 year old male who first reported difficulty swallowing 09/22/16 s/p aortic valve replacement, ascending aortic root replacement. Pt was intubated for total of 3 days following procedure. 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       SLP Plan  All goals met;Discharge SLP treatment due to (comment)       Recommendations  Diet recommendations: Regular;Thin liquid Liquids provided via: Cup;Straw Medication Administration: Whole meds with liquid Supervision: Patient able to self feed Compensations: Slow rate;Small sips/bites Postural Changes and/or Swallow Maneuvers: Seated upright 90 degrees                Oral Care Recommendations: Oral care BID Follow up Recommendations: None SLP Visit Diagnosis: Dysphagia, unspecified (R13.10) Plan: All goals met;Discharge SLP treatment due to (comment)       GO                Houston Siren 11/03/2016, 2:55 PM  Orbie Pyo [place].Ed [employer] Pager [phone]    Orbie Pyo [place].Ed [employer] [phone]

## 2016-11-04 ENCOUNTER — Inpatient Hospital Stay (HOSPITAL_COMMUNITY): Payer: Medicare Other

## 2016-11-04 LAB — BASIC METABOLIC PANEL
Anion gap: 13 (ref 5–15)
BUN: 11 mg/dL (ref 6–20)
CHLORIDE: 100 mmol/L — AB (ref 101–111)
CO2: 27 mmol/L (ref 22–32)
Calcium: 9.2 mg/dL (ref 8.9–10.3)
Creatinine, Ser: 0.87 mg/dL (ref 0.61–1.24)
GFR calc Af Amer: >60 mL/min (ref >60)
GFR calc non Af Amer: >60 mL/min (ref >60)
Glucose, Bld: 136 mg/dL — ABNORMAL HIGH (ref 65–99)
POTASSIUM: 3.9 mmol/L (ref 3.5–5.1)
Sodium: 140 mmol/L (ref 135–145)

## 2016-11-04 LAB — CBC
HEMATOCRIT: 35.8 % — AB (ref 39.0–52.0)
Hemoglobin: 10.8 g/dL — ABNORMAL LOW (ref 13.0–17.0)
MCH: 27.1 pg (ref 26.0–34.0)
MCHC: 30.2 g/dL (ref 30.0–36.0)
MCV: 89.7 fL (ref 78.0–100.0)
Platelets: 120 10*3/uL — ABNORMAL LOW (ref 150–400)
RBC: 3.99 MIL/uL — AB (ref 4.22–5.81)
RDW: 16 % — AB (ref 11.5–15.5)
WBC: 6.9 10*3/uL (ref 4.0–10.5)

## 2016-11-04 MED ORDER — TRAMADOL HCL 50 MG PO TABS: 50.0000 mg | ORAL_TABLET | Freq: Two times a day (BID) | ORAL | 0 refills | Status: DC | PRN

## 2016-11-04 NOTE — Progress Notes (Signed)
11/04/2016 11:50 AM Discharge AVS meds taken today and those due this evening reviewed.  Follow-up appointments and when to call md reviewed.  D/C IV and TELE.  Questions and concerns addressed.   D/C home per orders. [place]

## 2016-11-04 NOTE — Care Management Note (Signed)
Case Management Note Original Note Created Carles Collet, RN 10/30/2016, 11:04 AM  Patient Details  Name: Lonnie Hansen MRN: [id number] Date of Birth: 1950-09-17  Subjective/Objective:                 Patient from home w wife admitted from office to have IR place a pigtail catheter into the right chest to drain fluid and air. Was discharged two weeks ago with [facility].   Action/Plan: 11/03/16- Marvetta Gibbons RN, CM- call made to Colman Cater- with [facility]- confirmed pt was active with them PTA- for [facility] services- RN/PT/SLP- will need orders to resume [facility] services on discharge- CM will continue to follow  Expected Discharge Date:      11/04/16           Expected Discharge Plan:  [place]  In-House Referral:     Discharge planning Services  CM Consult  Post Acute Care Choice:  [place], Resumption of Svcs/PTA Provider Choice offered to:  Patient, Spouse  DME Arranged:  N/A DME Agency:  NA  HH Arranged:  PT, Speech Therapy HH Agency:  [facility] [place]  Status of Service:  Completed, signed off  If discussed at [place] of Stay Meetings, dates discussed:  10/9  Discharge Disposition: home/home health  Additional Comments:  11/04/16- [place], CM- pt for d/c home today- orders to resume [facility] services places- have notified [place] with [facility] of pt's discharge and resumption of [place] services needed.   Marvetta Gibbons [place], RN 11/04/2016, 10:47 AM [phone]

## 2016-11-04 NOTE — Progress Notes (Signed)
[address]       [place] [address]             [phone]         Subjective: Feels well  Objective: Vital signs in last 24 hours: Temp:  [98 F (36.7 C)-98.1 F (36.7 C)] 98.1 F (36.7 C) (10/10 0535) Pulse Rate:  [74-77] 77 (10/09 2125) Cardiac Rhythm: Heart block (10/10 0718) Resp:  [18] 18 (10/09 2040) BP: (100-114)/(67-75) 106/71 (10/10 0535) SpO2:  [100 %] 100 % (10/09 2040) Weight:  [182 lb (82.6 kg)] 182 lb (82.6 kg) (10/10 0535)  Hemodynamic parameters for last 24 hours:    Intake/Output from previous day: 10/09 0701 - 10/10 0700 In: 125 [P.O.:125] Out: 1475 [Urine:1475] Intake/Output this shift: No intake/output data recorded.  General appearance: alert, cooperative and no distress Heart: regular rate and rhythm Lungs: clear to auscultation bilaterally Abdomen: soft, non-tender; bowel sounds normal; no masses,  no organomegaly Extremities: no edema Wound: incis well healed  Lab Results: No results for input(s): WBC, HGB, HCT, PLT in the last 72 hours. BMET: No results for input(s): NA, K, CL, CO2, GLUCOSE, BUN, CREATININE, CALCIUM in the last 72 hours.  PT/INR: No results for input(s): LABPROT, INR in the last 72 hours. ABG    Component Value Date/Time   PHART 7.483 (H) 09/17/2016 1452   HCO3 26.6 09/17/2016 1452   TCO2 28 09/17/2016 1452   ACIDBASEDEF 1.0 09/16/2016 1048   O2SAT 59.9 09/20/2016 0355   CBG (last 3)  No results for input(s): GLUCAP in the last 72 hours.  Meds Scheduled Meds: . amLODipine  10 mg Oral Daily  . aspirin EC  81 mg Oral Daily  . atenolol  12.5 mg Oral BID  . docusate sodium  100 mg Oral BID  . enoxaparin (LOVENOX) injection  40 mg Subcutaneous Q24H  . furosemide  20 mg Oral Daily  . latanoprost  1 drop Both Eyes QHS  . pantoprazole  40 mg Oral Daily  . sodium chloride flush  3 mL Intravenous Q12H  . sodium chloride flush  5 mL Intravenous Q8H   Continuous Infusions: . sodium  chloride     PRN Meds:.sodium chloride, acetaminophen **OR** acetaminophen, levalbuterol, ondansetron **OR** ondansetron (ZOFRAN) IV, sodium chloride flush, traMADol  Xrays Dg Chest Port 1 View  Result Date: 11/03/2016 CLINICAL DATA:  Chest tube removal. EXAM: PORTABLE CHEST 1 VIEW COMPARISON:  11/03/2016 at 0630 hours FINDINGS: Sequelae of aortic valve replacement are again identified. The cardiomediastinal silhouette is unchanged with an ectatic appearance of the descending thoracic aorta. The right-sided chest tube has been removed. A small basilar right hydropneumothorax is similar to the prior study. The left lung is clear. IMPRESSION: Unchanged small right hydropneumothorax following chest tube removal. Electronically Signed   By: Logan Bores M.D.   On: 11/03/2016 12:04   Dg Chest Port 1 View  Result Date: 11/03/2016 CLINICAL DATA:  Hydropneumothorax EXAM: PORTABLE CHEST 1 VIEW COMPARISON:  11/02/2016 FINDINGS: Right chest tube remains in place with stable right hydropneumothorax. Pneumothorax component best seen at the right base. Left lung is clear. Heart is normal size. Prior aortic valve replacement. IMPRESSION: Stable small right basilar pneumothorax and right effusion. Electronically Signed   By: Rolm Baptise M.D.   On: 11/03/2016 07:22    Assessment/Plan:   1 CXR is stable and he is clinically doing very well 2 check labs this am , if no new issues discharge home  later today    LOS: 6 days    Whitlee Sluder E 11/04/2016

## 2016-11-05 ENCOUNTER — Encounter: Payer: Self-pay | Admitting: Speech Pathology

## 2016-11-05 ENCOUNTER — Telehealth (HOSPITAL_COMMUNITY): Payer: Self-pay

## 2016-11-05 ENCOUNTER — Ambulatory Visit: Payer: Medicare Other | Admitting: Physician Assistant

## 2016-11-05 NOTE — Telephone Encounter (Signed)
I called and left message on voicemail to call office about scheduling for cardiac rehab. I left office contact information on patient voicemail to return call.  ° °

## 2016-11-06 DIAGNOSIS — I10 Essential (primary) hypertension: Secondary | ICD-10-CM | POA: Diagnosis not present

## 2016-11-06 DIAGNOSIS — Z48812 Encounter for surgical aftercare following surgery on the circulatory system: Secondary | ICD-10-CM | POA: Diagnosis not present

## 2016-11-06 DIAGNOSIS — H409 Unspecified glaucoma: Secondary | ICD-10-CM | POA: Diagnosis not present

## 2016-11-06 DIAGNOSIS — M1611 Unilateral primary osteoarthritis, right hip: Secondary | ICD-10-CM | POA: Diagnosis not present

## 2016-11-06 DIAGNOSIS — I2510 Atherosclerotic heart disease of native coronary artery without angina pectoris: Secondary | ICD-10-CM | POA: Diagnosis not present

## 2016-11-06 DIAGNOSIS — D649 Anemia, unspecified: Secondary | ICD-10-CM | POA: Diagnosis not present

## 2016-11-07 DIAGNOSIS — M1611 Unilateral primary osteoarthritis, right hip: Secondary | ICD-10-CM | POA: Diagnosis not present

## 2016-11-07 DIAGNOSIS — D649 Anemia, unspecified: Secondary | ICD-10-CM | POA: Diagnosis not present

## 2016-11-07 DIAGNOSIS — Z48812 Encounter for surgical aftercare following surgery on the circulatory system: Secondary | ICD-10-CM | POA: Diagnosis not present

## 2016-11-07 DIAGNOSIS — I10 Essential (primary) hypertension: Secondary | ICD-10-CM | POA: Diagnosis not present

## 2016-11-07 DIAGNOSIS — I2510 Atherosclerotic heart disease of native coronary artery without angina pectoris: Secondary | ICD-10-CM | POA: Diagnosis not present

## 2016-11-07 DIAGNOSIS — H409 Unspecified glaucoma: Secondary | ICD-10-CM | POA: Diagnosis not present

## 2016-11-09 ENCOUNTER — Encounter: Payer: Self-pay | Admitting: Speech Pathology

## 2016-11-09 DIAGNOSIS — H43812 Vitreous degeneration, left eye: Secondary | ICD-10-CM | POA: Diagnosis not present

## 2016-11-09 DIAGNOSIS — H25013 Cortical age-related cataract, bilateral: Secondary | ICD-10-CM | POA: Diagnosis not present

## 2016-11-09 DIAGNOSIS — H2513 Age-related nuclear cataract, bilateral: Secondary | ICD-10-CM | POA: Diagnosis not present

## 2016-11-09 DIAGNOSIS — H401231 Low-tension glaucoma, bilateral, mild stage: Secondary | ICD-10-CM | POA: Diagnosis not present

## 2016-11-10 ENCOUNTER — Ambulatory Visit (INDEPENDENT_AMBULATORY_CARE_PROVIDER_SITE_OTHER): Payer: Medicare Other | Admitting: Physician Assistant

## 2016-11-10 ENCOUNTER — Other Ambulatory Visit: Payer: Self-pay

## 2016-11-10 ENCOUNTER — Encounter (HOSPITAL_COMMUNITY): Payer: Self-pay

## 2016-11-10 ENCOUNTER — Encounter: Payer: Self-pay | Admitting: Physician Assistant

## 2016-11-10 VITALS — BP 108/78 | HR 80 | Ht 69.0 in | Wt 183.0 lb

## 2016-11-10 DIAGNOSIS — J90 Pleural effusion, not elsewhere classified: Secondary | ICD-10-CM | POA: Diagnosis not present

## 2016-11-10 DIAGNOSIS — E785 Hyperlipidemia, unspecified: Secondary | ICD-10-CM

## 2016-11-10 DIAGNOSIS — I480 Paroxysmal atrial fibrillation: Secondary | ICD-10-CM

## 2016-11-10 DIAGNOSIS — I10 Essential (primary) hypertension: Secondary | ICD-10-CM | POA: Diagnosis not present

## 2016-11-10 DIAGNOSIS — Z9889 Other specified postprocedural states: Secondary | ICD-10-CM

## 2016-11-10 DIAGNOSIS — I2510 Atherosclerotic heart disease of native coronary artery without angina pectoris: Secondary | ICD-10-CM | POA: Diagnosis not present

## 2016-11-10 DIAGNOSIS — Z952 Presence of prosthetic heart valve: Secondary | ICD-10-CM | POA: Diagnosis not present

## 2016-11-10 NOTE — Progress Notes (Signed)
Cardiology Office Note    Date:  11/10/2016   ID:  Lonnie Hansen, DOB [birth year], MRN [id number]  PCP:  [name], MD  Cardiologist:  Dr. Martinique   Chief Complaint  Patient presents with  . Follow-up    seen for Dr. Martinique.    History of Present Illness:  Lonnie Hansen is a 66 y.o. 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 He had a syncopal spell in 2012, he was ruled out for MI, lab was significant for hypokalemia, follow-up stress test as outpatient was reportedly to be okay. I am unable to locate the previous stress test report. More recently, chest x-ray showed aortic root enlargement. He underwent CT that showed dilatation of proximal aorta. Descending aorta was normal. Echocardiogram obtained on 09/20/2015 showed EF 60-65%, moderate to severe AI, grade 1 DD. He was evaluated by Dr. Servando Snare. More recently, CT scan showed aortic root is enlarged to 5.2 cm. Given the size of aortic root and the degree of AI, it was recommended he undergo aortic root grafting and AVR. He underwent preoperative evaluation with cardiac catheterization on 04/14/2016 which showed moderate to severe AI, EF 45%, 80% stenosis in RCA, 50% distal LAD. He eventually underwent the planned procedure with ascending aortic root replacement using 32 mm Gelweave Valsalva Graft and AVR using 29 mm Edwards Perimount Magna Ease aortic bioprosthesis valve on 09/15/2016 by Dr. Servando Snare. Echo obtained on the following day on 09/14/2016 showed EF 55-60%, severe LVH, severely calcified mitral annulus without significant stenosis, moderate pericardial effusion posterior to the left atrium. Postoperatively, he did have some volume overload that responded well to diuretic. He also had postoperative thrombocytopenia as well. He was placed on oral amiodarone for postoperative atrial fibrillation.  I last saw the patient on 10/06/2016, he had markedly  diminished breath sounds in the right base of hte lung, chest x-ray showed recurrent right pleural effusion. I also placed him on Lipitor 40 mg daily given coronary artery disease. I obtain a limited echo on 10/09/2016, this showed only a trivial amount of pericardial effusion, EF 60-65%. I also started him on 20 mg daily of diuretic. It appears patient underwent right thoracentesis again on 10/23/2016 with the removal 2 L of dark blood. Post procedure, he developed a right pneumothorax related to incomplete reexpansion of the right middle and lower lobe of the lung. He was admitted on 10/30/2016 for pigtail drain placement, last chest x-ray obtained on 11/04/2016 showed improvement of right pleural effusion and resolution of right pneumothorax.   Past Medical History:  Diagnosis Date  . Anemia   . Arthritis   . Asymptomatic bilateral carotid artery stenosis 08/2015   1-39%   . Blood dyscrasia    "trouble with my bloods clotting"  . Bruising    on the skin states due to platelets or sometimes low or high  . Cataracts, bilateral   . Coronary artery disease   . Diverticulosis   . Enlarged aorta ([place])   . Enlarged prostate    slightly  . GERD (gastroesophageal reflux disease)    takes Pantoprazole daily as needed  . Glaucoma    uses eye drops daily  . Headache   . History of colon polyps    benign  . History of kidney stones   . Hyperlipidemia    no on any meds  . Hypertension    takes Amlodipine and Atenolol  daily  . Joint pain   . Joint swelling   . Nocturia   . Sleep apnea   . Vocal cord nodule    pt. states  it's a" growth on vocal cord"    Past Surgical History:  Procedure Laterality Date  . AORTIC ARCH ANGIOGRAPHY N/A 04/16/2016   Procedure: Aortic Arch Angiography;  Surgeon: [name] Martinique, MD;  Location: [facility];  Service: Cardiovascular;  Laterality: N/A;  . AORTIC VALVE REPLACEMENT N/A 09/15/2016   Procedure: AORTIC VALVE REPLACEMENT (AVR);  Surgeon: Grace Isaac, MD;  Location: [facility];  Service: Open Heart Surgery;  Laterality: N/A;  Using 60mm Edwards Perimount Magna Ease Aortic Bioprosthesis Valve  . ASCENDING AORTIC ROOT REPLACEMENT N/A 09/15/2016   Procedure: ASCENDING AORTIC ROOT REPLACEMENT;  Surgeon: Grace Isaac, MD;  Location: [facility];  Service: Open Heart Surgery;  Laterality: N/A;  Using 33mm Gelweave Valsalva Graft  . COLONOSCOPY    . COLONOSCOPY WITH ESOPHAGOGASTRODUODENOSCOPY (EGD)    . IR THORACENTESIS ASP PLEURAL SPACE W/IMG GUIDE  10/23/2016  . MULTIPLE EXTRACTIONS WITH ALVEOLOPLASTY N/A 06/10/2016   Procedure: Extraction of tooth #'s 2,8,13,15, and 29  with alveoloplasty, maxillary right and left buccal exostoses reductions, and gross debridement of remaining teeth.;  Surgeon: Lenn Cal, DDS;  Location: [facility];  Service: Oral Surgery;  Laterality: N/A;  . RIGHT/LEFT HEART CATH AND CORONARY ANGIOGRAPHY N/A 04/16/2016   Procedure: Right/Left Heart Cath and Coronary Angiography;  Surgeon: [name] Martinique, MD;  Location: [facility];  Service: Cardiovascular;  Laterality: N/A;  . TEE WITHOUT CARDIOVERSION N/A 09/15/2016   Procedure: TRANSESOPHAGEAL ECHOCARDIOGRAM (TEE);  Surgeon: Grace Isaac, MD;  Location: [facility];  Service: Open Heart Surgery;  Laterality: N/A;    Current Medications: Outpatient Medications Prior to Visit  Medication Sig Dispense Refill  . amLODipine (NORVASC) 10 MG tablet Take 10 mg by mouth daily.    Marland Kitchen aspirin EC 81 MG EC tablet Take 1 tablet (81 mg total) by mouth daily.    Marland Kitchen atenolol (TENORMIN) 25 MG tablet Take 0.5 tablets (12.5 mg total) by mouth 2 (two) times daily. 30 tablet 1  . furosemide (LASIX) 20 MG tablet Take 1 tablet (20 mg total) by mouth daily. 30 tablet 3  . latanoprost (XALATAN) 0.005 % ophthalmic solution Place 1 drop into both eyes at bedtime.    . Multiple Vitamin (MULTIVITAMIN WITH MINERALS) TABS tablet Take 1 tablet by mouth daily.    . pantoprazole (PROTONIX) 40 MG  tablet Take 40 mg by mouth daily as needed (indigestion).     . folic acid (FOLVITE) 1 MG tablet Take 1 tablet (1 mg total) by mouth daily. (Patient not taking: Reported on 11/10/2016) 30 tablet 1  . traMADol (ULTRAM) 50 MG tablet Take 1 tablet (50 mg total) by mouth every 12 (twelve) hours as needed for moderate pain. (Patient not taking: Reported on 11/10/2016) 20 tablet 0   No facility-administered medications prior to visit.      Allergies:   Patient has no known allergies.   Social History   Social History  . Marital status: Married    Spouse name: N/A  . Number of children: 1  . Years of education: N/A   Occupational History  . Musician    Social History Main Topics  . Smoking status: Former Smoker    Packs/day: 1.00    Years: 25.00    Types: Cigarettes  . Smokeless tobacco: Never Used  .  Alcohol use No  . Drug use: No  . Sexual activity: Not Asked   Other Topics Concern  . None   Social History Narrative   Married.   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     Family History:  The patient's family history includes Heart attack in his father; Heart disease in his father; Thyroid disease in his sister.   ROS:   Please see the history of present illness.    ROS All other systems reviewed and are negative.   PHYSICAL EXAM:   VS:  BP 108/78 (BP Location: Right Arm, Patient Position: Sitting, Cuff Size: Normal)   Pulse 80   Ht 5\' 9"  (1.753 m)   Wt 183 lb (83 kg)   BMI 27.02 kg/m    GEN: Well nourished, well developed, in no acute distress  HEENT: normal  Neck: no JVD, carotid bruits, or masses Cardiac: RRR; no murmurs, rubs, or gallops,no edema  Respiratory:  clear to auscultation bilaterally, normal work of breathing + minimally diminished breath sounds in the right base GI: soft, nontender, nondistended, + BS MS: no deformity or atrophy  Skin: warm and dry, no rash Neuro:  Alert and Oriented x 3, Strength and sensation are intact Psych: euthymic  mood, full affect  Wt Readings from Last 3 Encounters:  11/10/16 183 lb (83 kg)  11/04/16 182 lb (82.6 kg)  10/29/16 178 lb (80.7 kg)      Studies/Labs Reviewed:   EKG:  EKG is not ordered today.    Recent Labs: 09/16/2016: Magnesium 2.2 10/29/2016: ALT 32 10/30/2016: B Natriuretic Peptide 138.4 11/04/2016: BUN 11; Creatinine, Ser 0.87; Hemoglobin 10.8; Platelets 120; Potassium 3.9; Sodium 140   Lipid Panel    Component Value Date/Time   CHOL 135 10/07/2016 0812   TRIG 75 10/07/2016 0812   HDL 34 (L) 10/07/2016 0812   CHOLHDL 4.0 10/07/2016 0812   CHOLHDL 3.5 04/28/2010 0703   VLDL 14 04/28/2010 0703   LDLCALC 86 10/07/2016 0812    Additional studies/ records that were reviewed today include:   Echo 09/20/2015 LV EF: 60% - 65%  Study Conclusions  - Left ventricle: The cavity size was normal. There was moderate focal basal hypertrophy of the septum. Systolic function was normal. The estimated ejection fraction was in the range of 60% to 65%. Wall motion was normal; there were no regional wall motion abnormalities. Doppler parameters are consistent with abnormal left ventricular relaxation (grade 1 diastolic dysfunction). - Aortic valve: There was moderate to severe regurgitation. - Aorta: Aortic root dimension: 43 mm (ED). - Ascending aorta: The ascending aorta was mildly dilated. - Right ventricle: The cavity size was mildly dilated. Wall thickness was normal.    Cath 04/16/2016 Conclusion     Mid RCA lesion, 80 %stenosed.  Ost LAD to Prox LAD lesion, 25 %stenosed.  Dist LAD lesion, 50 %stenosed.  There is mild left ventricular systolic dysfunction.  LV end diastolic pressure is normal.  The left ventricular ejection fraction is 45-50% by visual estimate.  There is no mitral valve regurgitation.  There is no aortic valve stenosis.  LV end diastolic pressure is normal.  1. Single vessel obstructive CAD involving a small  nondominant RCA 2. Mild LV dysfunction. EF 45%. 3. Ascending thoracic aortic aneurysm. 4. 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 5. Normal Right heart And LV filling pressures 6. Normal cardiac output.   Plan: Aortic root grafting and AVR.      Echo 09/16/2016  LV EF: 55% - 60%  Study Conclusions  - Left ventricle: The cavity size was normal. Wall thickness was increased in a pattern of severe LVH. Systolic function was normal. The estimated ejection fraction was in the range of 55% to 60%. - Aortic valve: Valve area (VTI): 4.29 cm^2. Valve area (Vmax): 4.15 cm^2. Valve area (Vmean): 3.88 cm^2. - Mitral valve: Severely calcified annulus. Valve area by continuity equation (using LVOT flow): 3.35 cm^2. - Pericardium, extracardiac: Localized moderate pericardial effusion posterior to the LA.    Limited Echo 10/09/2016 LV EF: 60% -   65%  Study Conclusions  - Left ventricle: The cavity size was normal. Wall thickness was   increased in a pattern of mild LVH. Systolic function was normal.   The estimated ejection fraction was in the range of 60% to 65%. - Aortic valve: AV prosthesis appears to open well Peak and mean   gradients through the valve are 9 and 5 mm Hg respectively. - Mitral valve: Calcified annulus. Mildly thickened leaflets .   There was mild regurgitation. - Pulmonary arteries: PA peak pressure: 37 mm Hg (S). - Pericardium, extracardiac: A trivial pericardial effusion was   identified.   R thoracentesis 10/23/2016 Post CXR: IMPRESSION: Resolution of right-sided pleural effusion. Right pneumothorax is noted related to incomplete re-expansion of the right middle and lower lobes. This does not represent a true pneumothorax. Continued follow-up is recommended.    ASSESSMENT:    1. H/O aortic root repair   2. Recurrent right pleural effusion   3. H/O aortic valve replacement   4. Hyperlipidemia, unspecified hyperlipidemia type   5. Essential  hypertension   6. Coronary artery disease involving native coronary artery of native heart without angina pectoris   7. PAF (paroxysmal atrial fibrillation) (HCC)      PLAN:  In order of problems listed above:  1. H/o Aortic root repair and AVR: Still have some chest soreness, otherwise recovering well.  2. Recurrent right pleural effusion: Underwent thoracentesis with removal of 2 L of bloody fluid, thoracentesis was followed by inability to reexpand the right middle and lower lobe of the lung and resulting pneumothorax. He was recently admitted and had a pigtail catheter placed, pneumothorax resolved on the last chest x-ray. Still have trace amount of right pleural effusion. This is consistent with today's physical exam with minimally diminished breath sounds in the right base.  3. CAD: No obvious chest pain, 80% mid RCA and a 50% distal LAD lesion on previous cardiac catheterization. RCA was a small nondominant vessel. Continue medical therapy.   4. Postop atrial fibrillation: Not on systemic anticoagulation. Unless any recurrence, we do not have any plan for long-term systemic anticoagulation. He has finished amiodarone.  5. Hypertension: Blood pressure stable.  6. Hyperlipidemia: On Lipitor 40 mg daily. Last lipid panel 10/07/2016 showed total cholesterol 135, HDL 34, LDL 86, triglyceride 75. Consider up titrating his Lipitor to 80 mg daily on the next office visit in order to bring the LDL down to less than 70.    Medication Adjustments/Labs and Tests Ordered: Current medicines are reviewed at length with the patient today.  Concerns regarding medicines are outlined above.  Medication changes, Labs and Tests ordered today are listed in the Patient Instructions below. Patient Instructions  Medication Instructions:   NO CHANGE  Follow-Up:  Your physician recommends that you schedule a follow-up appointment AS SCHEDULED WITH DR Martinique  If you need a refill on your cardiac  medications before your next  appointment, please call your pharmacy.       Hilbert Corrigan, [place]  11/10/2016 9:42 PM    [facility] [address], [place], [place]  [address] Phone: [phone]; Fax: [phone]

## 2016-11-10 NOTE — Patient Instructions (Signed)
Medication Instructions:   NO CHANGE  Follow-Up:  Your physician recommends that you schedule a follow-up appointment AS SCHEDULED WITH DR Martinique  If you need a refill on your cardiac medications before your next appointment, please call your pharmacy.

## 2016-11-11 DIAGNOSIS — M1611 Unilateral primary osteoarthritis, right hip: Secondary | ICD-10-CM | POA: Diagnosis not present

## 2016-11-11 DIAGNOSIS — H409 Unspecified glaucoma: Secondary | ICD-10-CM | POA: Diagnosis not present

## 2016-11-11 DIAGNOSIS — D649 Anemia, unspecified: Secondary | ICD-10-CM | POA: Diagnosis not present

## 2016-11-11 DIAGNOSIS — I2510 Atherosclerotic heart disease of native coronary artery without angina pectoris: Secondary | ICD-10-CM | POA: Diagnosis not present

## 2016-11-11 DIAGNOSIS — Z48812 Encounter for surgical aftercare following surgery on the circulatory system: Secondary | ICD-10-CM | POA: Diagnosis not present

## 2016-11-11 DIAGNOSIS — I10 Essential (primary) hypertension: Secondary | ICD-10-CM | POA: Diagnosis not present

## 2016-11-12 DIAGNOSIS — D6489 Other specified anemias: Secondary | ICD-10-CM | POA: Diagnosis not present

## 2016-11-12 DIAGNOSIS — J90 Pleural effusion, not elsewhere classified: Secondary | ICD-10-CM | POA: Diagnosis not present

## 2016-11-12 DIAGNOSIS — Z23 Encounter for immunization: Secondary | ICD-10-CM | POA: Diagnosis not present

## 2016-11-12 DIAGNOSIS — R05 Cough: Secondary | ICD-10-CM | POA: Diagnosis not present

## 2016-11-12 DIAGNOSIS — M25551 Pain in right hip: Secondary | ICD-10-CM | POA: Diagnosis not present

## 2016-11-12 DIAGNOSIS — Z6828 Body mass index (BMI) 28.0-28.9, adult: Secondary | ICD-10-CM | POA: Diagnosis not present

## 2016-11-13 DIAGNOSIS — Z48812 Encounter for surgical aftercare following surgery on the circulatory system: Secondary | ICD-10-CM | POA: Diagnosis not present

## 2016-11-13 DIAGNOSIS — H409 Unspecified glaucoma: Secondary | ICD-10-CM | POA: Diagnosis not present

## 2016-11-13 DIAGNOSIS — I10 Essential (primary) hypertension: Secondary | ICD-10-CM | POA: Diagnosis not present

## 2016-11-13 DIAGNOSIS — D649 Anemia, unspecified: Secondary | ICD-10-CM | POA: Diagnosis not present

## 2016-11-13 DIAGNOSIS — M1611 Unilateral primary osteoarthritis, right hip: Secondary | ICD-10-CM | POA: Diagnosis not present

## 2016-11-13 DIAGNOSIS — I2510 Atherosclerotic heart disease of native coronary artery without angina pectoris: Secondary | ICD-10-CM | POA: Diagnosis not present

## 2016-11-16 ENCOUNTER — Encounter: Payer: Self-pay | Admitting: Speech Pathology

## 2016-11-18 ENCOUNTER — Other Ambulatory Visit: Payer: Self-pay | Admitting: Cardiothoracic Surgery

## 2016-11-18 DIAGNOSIS — J948 Other specified pleural conditions: Secondary | ICD-10-CM

## 2016-11-19 ENCOUNTER — Ambulatory Visit (INDEPENDENT_AMBULATORY_CARE_PROVIDER_SITE_OTHER): Payer: Self-pay | Admitting: Cardiothoracic Surgery

## 2016-11-19 ENCOUNTER — Encounter: Payer: Self-pay | Admitting: Cardiothoracic Surgery

## 2016-11-19 ENCOUNTER — Telehealth (HOSPITAL_COMMUNITY): Payer: Self-pay

## 2016-11-19 ENCOUNTER — Ambulatory Visit
Admission: RE | Admit: 2016-11-19 | Discharge: 2016-11-19 | Disposition: A | Discharge status: Home / Self Care | Payer: Medicare Other | Source: Ambulatory Visit | Attending: Cardiothoracic Surgery | Admitting: Cardiothoracic Surgery

## 2016-11-19 VITALS — BP 118/83 | HR 82 | Resp 16 | Ht 69.0 in | Wt 183.2 lb

## 2016-11-19 DIAGNOSIS — J90 Pleural effusion, not elsewhere classified: Secondary | ICD-10-CM | POA: Diagnosis not present

## 2016-11-19 DIAGNOSIS — J939 Pneumothorax, unspecified: Secondary | ICD-10-CM

## 2016-11-19 DIAGNOSIS — J948 Other specified pleural conditions: Secondary | ICD-10-CM

## 2016-11-19 DIAGNOSIS — Z9889 Other specified postprocedural states: Secondary | ICD-10-CM

## 2016-11-19 DIAGNOSIS — Z952 Presence of prosthetic heart valve: Secondary | ICD-10-CM

## 2016-11-19 NOTE — Patient Instructions (Signed)
Endocarditis Information  You may be at risk for developing endocarditis since you have  an artificial heart valve  or a repaired heart valve. Endocarditis is an infection of the lining of the heart or heart valves.   Certain surgical and dental procedures may put you at risk,  such as teeth cleaning or other dental procedures or any surgery involving the respiratory, urinary, gastrointestinal tract, gallbladder or prostate.   Notify your doctor or dentist before having any invasive procedures. You will need to take antibiotics before certain procedures.   To prevent endocarditis, maintain good oral health. Seek prompt medical attention for any mouth/gum, skin or urinary tract infections.     Heart  Valve Replacement-Care After  Read the instructions outlined below and refer to this sheet for the next few weeks. These discharge instructions provide you with general information on caring for yourself after you leave the hospital. Your surgeon may also give you specific instructions. While your treatment has been planned according to the most current medical practices available, unavoidable complications occasionally occur. If you have any problems or questions after discharge, please call your surgeon. AFTER THE PROCEDURE  Full recovery from heart valve surgery can take several months.   Blood thinning (anticoagulation) treatment with warfarin is some times prescribed for 6 weeks to 3 months after surgery for those with biological valves. It is prescribed for life for those with mechanical valves.   Recovery includes healing of the surgical incision. There is a gradual building of stamina and exercise abilities. An exercise program under the direction of a physical therapist may be recommended.   Once you have an artificial valve, your heart function and your life will return to normal. You usually feel better after surgery. Shortness of breath and fatigue should lessen. If your heart was  already severely damaged before your surgery, you may continue to have problems.    Individuals with an aortic valve replacement need to take antibiotics before having dental work or other surgical procedures. This is called prophylactic antibiotic treatment. These drugs help to prevent infective endocarditis. Antibiotics are only recommended for individuals with the highest risk for developing infective endocarditis. Let your dentist and your caregiver know if you have a history of any of the following so that the necessary precautions can be taken:  Endocarditis in the past.   An artificial (prosthetic) heart valve.  HOME CARE INSTRUCTIONS   Use all medications as prescribed.   Take your temperature every morning for the first week after surgery. Record these.   Weigh yourself every morning for at least the first week after surgery and record.   Do not lift more than 15 pounds until your breastbone (sternum) has healed, about 3 months. Avoid all activities which would place strain on your incision.   You may shower as soon as directed by your caregiver after surgery. Pat incisions dry. Do not rub incisions with washcloth or towel.   Avoid driving for 4 weeks following surgery or as instructed.  Pain Control  If a prescription was given for a pain reliever, please follow your doctor's directions.   If the pain is not relieved by your medicine, becomes worse, or you have difficulty breathing, call your surgeon.  Activity  Take frequent rest periods throughout the day.   Wait one week before returning to strenuous activities such as heavy lifting (more than 10 pounds), pushing or pulling.   Talk with your doctor about when you may return to work and your  exercise routine.   Do not drive while taking prescription pain medication.  Nutrition  You may resume your normal diet.   Drink plenty of fluids (6-8 glasses a day).   Eat a well-balanced diet.   Call your caregiver for  persistent nausea or vomiting.  Elimination Your normal bowel function should return. If constipation should occur, you may:  Take a mild laxative.   Add fruit and bran to your diet.   Drink more fluids.   Call your doctor if constipation is not relieved.  SEEK IMMEDIATE MEDICAL CARE IF:   You develop chest pain which is not coming from your surgical cut (incision).   You develop shortness of breath or have difficulty breathing.   You develop a temperature over 101 F (38.3 C).   You have a sudden weight gain. Let your caregiver know what the weight gain is.   You develop a rash.   You develop any reaction or side effects to medications given.   You have increased bleeding from wounds.   You see redness, swelling, or have increasing pain in wounds.   You have pus coming from your wound.   You develop lightheadedness or feel faint.   Endocarditis Information  You may be at risk for developing endocarditis since you have  an artificial heart valve  or a repaired heart valve. Endocarditis is an infection of the lining of the heart or heart valves.   Certain surgical and dental procedures may put you at risk,  such as teeth cleaning or other dental procedures or any surgery involving the respiratory, urinary, gastrointestinal tract, gallbladder or prostate.   Notify your doctor or dentist before having any invasive procedures. You will need to take antibiotics before certain procedures.   To prevent endocarditis, maintain good oral health. Seek prompt medical attention for any mouth/gum, skin or urinary tract infections.

## 2016-11-19 NOTE — Telephone Encounter (Signed)
Spoke with patient in regards to Cardiac Rehab.. Patient is interested. Scheduled orientation for 12/03/16 at 1:30pm. He will be attending the 1:15pm exc class.

## 2016-11-19 NOTE — Progress Notes (Signed)
[address]       [place] [address]             [phone]      Blake C Almario 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 PROCEDURES:  Biologic [place] with replacement of aortic valve with [employer] pericardial tissue valve, model 3300TFX 29 mm, serial #[id number], and replacement of the aortic root and ascending aorta with a 32 mm Gelweave Valsalva graft and reimplantation of the right and left coronary buttons. SURGEON: Grace Isaac MD  History of Present Illness:     Patient returns to the office today after recent hospitalization for recurrent right pleural effusion.          Past Medical History:  Diagnosis Date  . Anemia   . Arthritis   . Asymptomatic bilateral carotid artery stenosis 08/2015   1-39%   . Blood dyscrasia    "trouble with my bloods clotting"  . Bruising    on the skin states due to platelets or sometimes low or high  . Cataracts, bilateral   . Coronary artery disease   . Diverticulosis   . Enlarged aorta ([place])   . Enlarged prostate    slightly  . GERD (gastroesophageal reflux disease)    takes Pantoprazole daily as needed  . Glaucoma    uses eye drops daily  . Headache   . History of colon polyps    benign  . History of kidney stones   . Hyperlipidemia    no on any meds  . Hypertension    takes Amlodipine and Atenolol daily  . Joint pain   . Joint swelling   . Nocturia   . Sleep apnea   . Vocal cord nodule    pt. states  it's a" growth on vocal cord"     History  Smoking Status  . Former Smoker  . Packs/day: 1.00  . Years: 25.00  . Types: Cigarettes    Smokeless Tobacco  . Never Used    History  Alcohol Use No     No Known Allergies  Current Outpatient Prescriptions  Medication Sig Dispense Refill  . amLODipine (NORVASC) 10 MG tablet Take 10 mg by mouth daily.    Marland Kitchen aspirin EC 81 MG EC tablet Take 1 tablet (81 mg total) by mouth daily.    Marland Kitchen atenolol (TENORMIN) 25 MG tablet Take 0.5 tablets (12.5 mg total) by mouth 2 (two) times daily. 30 tablet 1  . Ferrous Sulfate (IRON) 325 (65 Fe) MG TABS Take 1 tablet by mouth daily.    . furosemide (LASIX) 20 MG tablet Take 1 tablet (20 mg total) by mouth daily. 30 tablet 3  . latanoprost (XALATAN) 0.005 % ophthalmic solution Place 1 drop into both eyes at bedtime.    . Multiple Vitamin (MULTIVITAMIN WITH MINERALS) TABS tablet Take 1 tablet by mouth daily.    . pantoprazole (PROTONIX) 40 MG tablet Take 40 mg by mouth daily as needed (indigestion).      No current facility-administered medications for this visit.        Physical Exam: BP 118/83 (BP Location: Right Arm, Patient Position: Sitting, Cuff Size: Large)  Pulse 82   Resp 16   Ht 5\' 9"  (1.753 m)   Wt 183 lb 3.2 oz (83.1 kg)   SpO2 98% Comment: ON RA  BMI 27.05 kg/m    Physical Exam  Constitutional: He is oriented to person, place, and time. No distress.  Neck: No JVD present. No tracheal deviation present. No thyromegaly present.  Cardiovascular: Exam reveals no gallop and no friction rub.   No murmur heard. Respiratory: No stridor. No respiratory distress. He has no wheezes. He has no rales. He exhibits no tenderness.  GI: He exhibits no distension. There is no tenderness. There is no rebound.  Musculoskeletal: He exhibits no edema or tenderness.  Neurological: He is alert and oriented to person, place, and time.  Skin: No rash noted. He is not diaphoretic. No erythema. No pallor.  Psychiatric: He has a normal mood and affect. His behavior is normal. Judgment and thought content normal.    Diagnostic Studies &  Laboratory data:     Recent Radiology Findings:   Dg Chest 2 View  Result Date: 11/19/2016 CLINICAL DATA:  Status post aortic valve replacement on 09/15/2016, some chest pain EXAM: CHEST  2 VIEW COMPARISON:  Chest x-ray of 11/04/2016 FINDINGS: The small right pleural effusion remains with mild right basilar volume loss. No definite pneumothorax is seen. The left lung is clear. Heart size is stable and the descending thoracic aorta remains ectatic. Median sternotomy sutures are noted and aortic valve replacement is present. IMPRESSION: 1. Little change is a small right pleural effusion with mild right basilar volume loss. 2. No pneumothorax. Electronically Signed   By: Ivar Drape M.D.   On: 11/19/2016 09:27    I have independently reviewed the above radiology studies  and reviewed the findings with the patient.    Recent Lab Findings: Lab Results  Component Value Date   WBC 6.9 11/04/2016   HGB 10.8 (L) 11/04/2016   HCT 35.8 (L) 11/04/2016   PLT 120 (L) 11/04/2016   GLUCOSE 136 (H) 11/04/2016   CHOL 135 10/07/2016   TRIG 75 10/07/2016   HDL 34 (L) 10/07/2016   LDLCALC 86 10/07/2016   ALT 32 10/29/2016   AST 30 10/29/2016   NA 140 11/04/2016   K 3.9 11/04/2016   CL 100 (L) 11/04/2016   CREATININE 0.87 11/04/2016   BUN 11 11/04/2016   CO2 27 11/04/2016   TSH 1.611 04/28/2010   INR 1.15 10/30/2016   HGBA1C 5.3 09/11/2016   Result status: Final result                           *[facility]*                        [address] [place], [place] [address]                            [phone]  ------------------------------------------------------------------- Transthoracic Echocardiography  Patient:    Jru, Pense MR #:       [id number] Study Date: 10/09/2016 Gender:     M Age:        66 Height:     175.3 cm Weight:     90.1 kg BSA:  2.12 m^2 Pt. Status: Room:   SONOGRAPHER  Diamond Nickel  PERFORMING   [facility],  Outpatient  Joanna Hews [phone]  Eva [phone]  [place], MD  cc:  ------------------------------------------------------------------- LV EF: 60% -   65%  ------------------------------------------------------------------- Indications:      I31.3 Pericardial effusion.  ------------------------------------------------------------------- History:   PMH:  Aortic valve replacement. Aortic root dilatation. Sleep apnea.  Syncope.  Coronary artery disease.  Risk factors: Hypertension. Dyslipidemia.  ------------------------------------------------------------------- Study Conclusions  - Left ventricle: The cavity size was normal. Wall thickness was   increased in a pattern of mild LVH. Systolic function was normal.   The estimated ejection fraction was in the range of 60% to 65%. - Aortic valve: AV prosthesis appears to open well Peak and mean   gradients through the valve are 9 and 5 mm Hg respectively. - Mitral valve: Calcified annulus. Mildly thickened leaflets .   There was mild regurgitation. - Pulmonary arteries: PA peak pressure: 37 mm Hg (S). - Pericardium, extracardiac: A trivial pericardial effusion was   identified.  ------------------------------------------------------------------- Study data:  Comparison was made to the study of 09/16/2016.  Study status:  Routine.  Procedure:  Transthoracic echocardiography. Image quality was adequate.  Study completion:  There were no complications.          Transthoracic echocardiography.  M-mode, complete 2D, spectral Doppler, and color Doppler.  Birthdate: Patient birthdate: 06/06/1950.  Age:  Patient is 66 yr old.  Sex: Gender: male.    BMI: 29.3 kg/m^2.  Blood pressure:     114/81 Patient status:  Outpatient.  Study date:  Study date: 10/09/2016. Study time: 01:03 PM.  Location:  [facility]  -------------------------------------------------------------------  ------------------------------------------------------------------- Left ventricle:  Difficult acoustic windows LVEF is normal at approximately 60 % with mossible mild distal inferior hypokeinesis. The cavity size was normal. Wall thickness was increased in a pattern of mild LVH. Systolic function was normal. The estimated ejection fraction was in the range of 60% to 65%.  ------------------------------------------------------------------- Aortic valve:  AV prosthesis appears to open well Peak and mean gradients through the valve are 9 and 5 mm Hg respectively.  Mildly thickened leaflets.  Doppler:  There was no regurgitation.    VTI ratio of LVOT to aortic valve: 0.47. Peak velocity ratio of LVOT to aortic valve: 0.47. Mean velocity ratio of LVOT to aortic valve: 0.46.    Mean gradient (S): 5 mm Hg. Peak gradient (S): 9 mm Hg.   ------------------------------------------------------------------- Aorta:  Aortic root dilated at 43 mm Ascending aorta is dilated at 45 mm.  ------------------------------------------------------------------- Mitral valve:   Calcified annulus. Mildly thickened leaflets . Leaflet separation was normal.  Doppler:  Transvalvular velocity was within the normal range. There was no evidence for stenosis. There was mild regurgitation.    Peak gradient (D): 2 mm Hg.  ------------------------------------------------------------------- Left atrium:  The atrium was normal in size.  ------------------------------------------------------------------- Right ventricle:  The cavity size was normal. Wall thickness was normal. Systolic function was normal.  ------------------------------------------------------------------- Pulmonic valve:    Structurally normal valve.   Cusp separation was normal.  Doppler:  Transvalvular velocity was within the normal range. There was no  regurgitation.  ------------------------------------------------------------------- Tricuspid valve:   Structurally normal valve.   Leaflet separation was normal.  Doppler:  Transvalvular velocity was within the normal range. There was trivial regurgitation.  ------------------------------------------------------------------- Right atrium:  The atrium was normal in  size.  ------------------------------------------------------------------- Pericardium:  A trivial pericardial effusion was identified.  ------------------------------------------------------------------- Systemic veins: Inferior vena cava: The vessel was normal in size. The respirophasic diameter changes were in the normal range (= 50%), consistent with normal central venous pressure.  ------------------------------------------------------------------- Post procedure conclusions Ascending Aorta:  - Aortic root dilated at 43 mm Ascending aorta is dilated at 45 mm.  ------------------------------------------------------------------- Measurements   Left ventricle                         Value        Reference  LV ID, ED, PLAX chordal                46.1  mm     43 - 52  LV ID, ES, PLAX chordal                37.2  mm     23 - 38  LV fx shortening, PLAX chordal (L)     19    %      >=29  LV PW thickness, ED                    14.7  mm     ---------  IVS/LV PW ratio, ED                    0.86         <=1.3  LV e&', lateral                         9.87  cm/s   ---------  LV E/e&', lateral                       7.95         ---------  LV e&', medial                          10.6  cm/s   ---------  LV E/e&', medial                        7.41         ---------  LV e&', average                         10.24 cm/s   ---------  LV E/e&', average                       7.67         ---------    Ventricular septum                     Value        Reference  IVS thickness, ED                      12.7  mm      ---------    LVOT                                   Value        Reference  LVOT peak velocity, S  70.6  cm/s   ---------  LVOT mean velocity, S                  49.9  cm/s   ---------  LVOT VTI, S                            13.5  cm     ---------  LVOT peak gradient, S                  2     mm Hg  ---------    Aortic valve                           Value        Reference  Aortic valve peak velocity, S          150   cm/s   ---------  Aortic valve mean velocity, S          108   cm/s   ---------  Aortic valve VTI, S                    29    cm     ---------  Aortic mean gradient, S                5     mm Hg  ---------  Aortic peak gradient, S                9     mm Hg  ---------  VTI ratio, LVOT/AV                     0.47         ---------  Velocity ratio, peak, LVOT/AV          0.47         ---------  Velocity ratio, mean, LVOT/AV          0.46         ---------    Left atrium                            Value        Reference  LA ID, A-P, ES                         33    mm     ---------  LA ID/bsa, A-P                         1.56  cm/m^2 <=2.2  LA volume, ES, 1-p A4C                 38    ml     ---------  LA volume/bsa, ES, 1-p A4C             18    ml/m^2 ---------    Mitral valve                           Value        Reference  Mitral E-wave peak velocity            78.5  cm/s   ---------  Mitral A-wave peak velocity  51.8  cm/s   ---------  Mitral deceleration time       (L)     130   ms     150 - 230  Mitral peak gradient, D                2     mm Hg  ---------  Mitral E/A ratio, peak                 1.5          ---------    Pulmonary arteries                     Value        Reference  PA pressure, S, DP             (H)     37    mm Hg  <=30    Tricuspid valve                        Value        Reference  Tricuspid regurg peak velocity         270   cm/s   ---------  Tricuspid peak RV-RA gradient          29    mm Hg  ---------     Systemic veins                         Value        Reference  Estimated CVP                          8     mm Hg  ---------    Right ventricle                        Value        Reference  RV pressure, S, DP             (H)     37    mm Hg  <=30  Legend: (L)  and  (H)  mark values outside specified reference range.  ------------------------------------------------------------------- Prepared and Electronically Authenticated by  Dorris Carnes, M.D. 2018-09-16T20:23:22      Assessment / Plan:      Patient status post Deneen Harts with biologic valve, Now with clear lung fields bilaterally and resolution of right pleural effusion after pigtail drainage, but follow-up x-ray today shows no recurrence Patient has been trying to enroll in cardiac rehab but so far has had no call back, we will send another referral Plan to see him back in 4 weeks with a follow-up chest x-ray    Grace Isaac MD      [address] [place] [address] Office [phone]   Beeper [phone]  11/19/2016 10:19 AM

## 2016-11-24 DIAGNOSIS — Z860100 Personal history of colon polyps, unspecified: Secondary | ICD-10-CM | POA: Insufficient documentation

## 2016-11-24 DIAGNOSIS — M2540 Effusion, unspecified joint: Secondary | ICD-10-CM | POA: Insufficient documentation

## 2016-11-24 DIAGNOSIS — D759 Disease of blood and blood-forming organs, unspecified: Secondary | ICD-10-CM | POA: Insufficient documentation

## 2016-11-24 DIAGNOSIS — H409 Unspecified glaucoma: Secondary | ICD-10-CM | POA: Insufficient documentation

## 2016-11-24 DIAGNOSIS — K5790 Diverticulosis of intestine, part unspecified, without perforation or abscess without bleeding: Secondary | ICD-10-CM | POA: Insufficient documentation

## 2016-11-24 DIAGNOSIS — J382 Nodules of vocal cords: Secondary | ICD-10-CM | POA: Insufficient documentation

## 2016-11-24 DIAGNOSIS — Z87442 Personal history of urinary calculi: Secondary | ICD-10-CM | POA: Insufficient documentation

## 2016-11-24 DIAGNOSIS — I10 Essential (primary) hypertension: Secondary | ICD-10-CM | POA: Insufficient documentation

## 2016-11-24 DIAGNOSIS — D649 Anemia, unspecified: Secondary | ICD-10-CM | POA: Insufficient documentation

## 2016-11-24 DIAGNOSIS — R351 Nocturia: Secondary | ICD-10-CM | POA: Insufficient documentation

## 2016-11-24 DIAGNOSIS — K219 Gastro-esophageal reflux disease without esophagitis: Secondary | ICD-10-CM | POA: Insufficient documentation

## 2016-11-24 DIAGNOSIS — N400 Enlarged prostate without lower urinary tract symptoms: Secondary | ICD-10-CM | POA: Insufficient documentation

## 2016-11-24 DIAGNOSIS — M2550 Pain in unspecified joint: Secondary | ICD-10-CM | POA: Insufficient documentation

## 2016-11-24 DIAGNOSIS — I2510 Atherosclerotic heart disease of native coronary artery without angina pectoris: Secondary | ICD-10-CM | POA: Insufficient documentation

## 2016-11-24 DIAGNOSIS — G4730 Sleep apnea, unspecified: Secondary | ICD-10-CM | POA: Insufficient documentation

## 2016-11-24 DIAGNOSIS — I7143 Infrarenal abdominal aortic aneurysm, without rupture: Secondary | ICD-10-CM | POA: Insufficient documentation

## 2016-11-24 DIAGNOSIS — R51 Headache: Secondary | ICD-10-CM

## 2016-11-24 DIAGNOSIS — I7789 Other specified disorders of arteries and arterioles: Secondary | ICD-10-CM | POA: Insufficient documentation

## 2016-11-24 DIAGNOSIS — H269 Unspecified cataract: Secondary | ICD-10-CM | POA: Insufficient documentation

## 2016-11-24 DIAGNOSIS — T148XXA Other injury of unspecified body region, initial encounter: Secondary | ICD-10-CM | POA: Insufficient documentation

## 2016-11-24 DIAGNOSIS — R519 Headache, unspecified: Secondary | ICD-10-CM | POA: Insufficient documentation

## 2016-11-24 DIAGNOSIS — M1990 Unspecified osteoarthritis, unspecified site: Secondary | ICD-10-CM | POA: Insufficient documentation

## 2016-11-26 ENCOUNTER — Encounter: Payer: Self-pay | Admitting: Neurology

## 2016-11-29 ENCOUNTER — Other Ambulatory Visit: Payer: Self-pay | Admitting: Surgical

## 2016-11-30 ENCOUNTER — Ambulatory Visit (INDEPENDENT_AMBULATORY_CARE_PROVIDER_SITE_OTHER): Payer: Medicare Other | Admitting: Neurology

## 2016-11-30 ENCOUNTER — Encounter: Payer: Self-pay | Admitting: Neurology

## 2016-11-30 VITALS — BP 120/84 | HR 74 | Ht 68.0 in | Wt 187.0 lb

## 2016-11-30 DIAGNOSIS — G4731 Primary central sleep apnea: Secondary | ICD-10-CM

## 2016-11-30 DIAGNOSIS — I2510 Atherosclerotic heart disease of native coronary artery without angina pectoris: Secondary | ICD-10-CM | POA: Diagnosis not present

## 2016-11-30 DIAGNOSIS — Z9889 Other specified postprocedural states: Secondary | ICD-10-CM | POA: Diagnosis not present

## 2016-11-30 DIAGNOSIS — R0902 Hypoxemia: Secondary | ICD-10-CM | POA: Diagnosis not present

## 2016-11-30 DIAGNOSIS — I7121 Aneurysm of the ascending aorta, without rupture: Secondary | ICD-10-CM

## 2016-11-30 DIAGNOSIS — I5022 Chronic systolic (congestive) heart failure: Secondary | ICD-10-CM

## 2016-11-30 DIAGNOSIS — I712 Thoracic aortic aneurysm, without rupture: Secondary | ICD-10-CM | POA: Diagnosis not present

## 2016-11-30 NOTE — Progress Notes (Signed)
SLEEP MEDICINE CLINIC   Provider:  Larey Hansen, M D  Primary Care Physician:  Lonnie Battles, MD   Referring Provider: [name], MD    Chief Complaint  Patient presents with  . Follow-up    pt alone, rm 10. pt had a sleep study in 2011. pt states that at that time he was not willing to try  it because the mask were so big and he has claustrophobia    HPI:  Lonnie Hansen is an [place], right handed  66 y.o. male , seen here as in a referral  from Dr. Philip Hansen for a new evaluation of sleep apnea.   Lonnie Hansen has been a patient at [place] at [facility] in the year 2011, was diagnosed with sleep apnea, was prescribed a CPAP - never started CPAP therapy.  The patient has an interesting medical history including a GI bleed in 2008, ongoing hypertension, hyperlipidemia, chronic insomnia with a family history of obstructive sleep apnea and a brother.   He had an AHI of 24/hr. on  9/23/ 2011 at [facility], was prescribed CPAP ,  but did not use CPAP- due to claustrophobia- exacerbated by a full facemask.  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  Lonnie Hansen is his cardiac surgeon, Lonnie Hansen versus abdominal and general surgeon, Lonnie Hansen is his cardiologist.  As of March 2018 his cardiac cath showed 80% RCA 50% distal LAD and severe AI with a left ventricular ejection fraction of 45%.  On 15 September 2016 he underwent a Bentall surgery with aortic root replacement and reimplantation of the coronaries, AVR.   Chief complaint according to patient : Lonnie Hansen reports that his wife has been upset with him but not taking care of his snoring, she also has witnessed multiple times prolonged apneic pausing, and his primary care physician, Dr. Leanna Hansen, related to him  that his memory concerns may also be caused by sleep apnea.  Sleep habits are as follows: Lonnie Hansen bedtime is around 11 PM, but he does still plays music and sometimes the "gig " takes longer.  Usually watches TV before going to bed, his bedroom is cool, quiet and dark.  He shares a bedroom with his wife. Since the heart surgery he can no longer sleep prone, sleep often supine. On several pillows.  It feels to him almost as if he is reclined -  It helps with breathing however. He snores thunderously on his back.  He keeps a urinal beside the bed since he is on diuretics and usually has 4-5 nocturia each night. He arises at 9.30 - used to sleep until noon.  He sleeps 4-5 hours, non continuous sleep.   Sleep medical history and family sleep history: His brother Lonnie Hansen has been diagnosed with obstructive sleep apnea and is on CPAP.   Social history: Lonnie Hansen is married, retired but mainly considered disabled.  He has a remote history of smoking, he drank alcohol but was never addicted, Caffeine use; used to be a strong coffee drinker until his hypertension forced him to quit.  He now is chocolate milk and Hansen sweet tea. 2 a day.   Review of Systems: Out of a complete 14 system review, the patient complains of only the following symptoms, and all other reviewed systems are negative.  6/15 depression.  Hip and lower back pain, history of heart disease, history of orthopnea, joint pain snoring, anemia or easy bruising, easily bleeding.,glaucoma. Pleural effusion of 3 liters volume, leading to SOB.   Epworth score 3 , Fatigue severity score 59  Social History   Socioeconomic History  . Marital status: Married    Spouse name: Not on file  . Number of children: 1  . Years of education: Not on file  . Highest education level: Not on file  Social Needs  . Financial resource strain: Not on file  . Food insecurity - worry: Not on file  . Food insecurity - inability: Not on file  .  Transportation needs - medical: Not on file  . Transportation needs - non-medical: Not on file  Occupational History  . Occupation: Musician  Tobacco Use  . Smoking status: Former Smoker    Packs/day: 1.00    Years: 25.00    Pack years: 25.00    Types: Cigarettes  . Smokeless tobacco: Never Used  Substance and Sexual Activity  . Alcohol use: No    Alcohol/week: 0.0 oz  . Drug use: No  . Sexual activity: Not on file  Other Topics Concern  . 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    Family History  Problem Relation Age of Onset  . Heart disease Father   . Heart attack Father   . Thyroid disease Sister     Past Medical History:  Diagnosis Date  . Anemia   . Arthritis   . Asymptomatic bilateral carotid artery stenosis 08/2015   1-39%   . Blood dyscrasia    "trouble with my bloods clotting"  . Bruising    on the skin states due to platelets or sometimes low or high  . Cataracts, bilateral   . Coronary artery disease   . Diverticulosis   . Enlarged aorta ([place])   . Enlarged prostate    slightly  . GERD (gastroesophageal reflux disease)    takes Pantoprazole daily as needed  . Glaucoma    uses eye drops daily  . Headache   . History of colon polyps    benign  . History of kidney stones   . Hyperlipidemia    no on any meds  . Hypertension    takes Amlodipine and Atenolol daily  . Joint pain   . Joint swelling   . Nocturia   . Sleep apnea   . Vocal cord nodule    pt. states  it's a" growth on vocal cord"    Past Surgical History:  Procedure Laterality Date  . COLONOSCOPY    . COLONOSCOPY WITH ESOPHAGOGASTRODUODENOSCOPY (EGD)    . IR THORACENTESIS ASP PLEURAL SPACE W/IMG GUIDE  10/23/2016    Current Outpatient Medications  Medication Sig Dispense Refill  . amLODipine (NORVASC) 10 MG tablet Take 10 mg by mouth daily.    Marland Kitchen aspirin EC 81 MG EC tablet Take 1 tablet (81 mg total) by mouth daily.    Marland Kitchen  atenolol (TENORMIN) 25 MG tablet Take 0.5 tablets (12.5 mg total) by mouth 2 (two) times daily. 30 tablet 1  . Ferrous Sulfate (IRON) 325 (65 Fe) MG TABS Take 1 tablet by mouth daily.    . furosemide (LASIX) 20 MG tablet Take 1 tablet (20 mg total) by mouth daily. 30 tablet 3  . latanoprost (XALATAN) 0.005 % ophthalmic solution Place  1 drop into both eyes at bedtime.    . Multiple Vitamin (MULTIVITAMIN WITH MINERALS) TABS tablet Take 1 tablet by mouth daily.    . pantoprazole (PROTONIX) 40 MG tablet Take 40 mg by mouth daily as needed (indigestion).      No current facility-administered medications for this visit.     Allergies as of 11/30/2016  . (No Known Allergies)    Vitals: BP 120/84   Pulse 74   Ht 5\' 8"  (1.727 m)   Wt 187 lb (84.8 kg)   BMI 28.43 kg/m  Last Weight:  Wt Readings from Last 1 Encounters:  11/30/16 187 lb (84.8 kg)   SEG:BTDV mass index is 28.43 kg/m.     Last Height:   Ht Readings from Last 1 Encounters:  11/30/16 5\' 8"  (1.727 m)    Physical exam:  General: The patient is awake, alert and appears not in acute distress. The patient is well groomed. Head: Normocephalic, atraumatic. Neck is supple. Mallampati 2-3 ,  neck circumference:16, Nasal airflow patent, TMJ is evident . Retrognathia is seen.  Cardiovascular:  Regular rate and rhythm , without  murmurs or carotid bruit, and without distended neck veins. Respiratory: Lungs are clear to auscultation. Skin:  Without evidence of edema, or rash Trunk: BMI is 28 . The patient's posture is erect  Neurologic exam : The patient is awake and alert, oriented to place and time. Attention span & concentration ability appears normal.  Speech is fluent,  without dysarthria, dysphonia or aphasia. Mood and affect are appropriate.  Cranial nerves: Pupils are equal and briskly reactive to light. Funduscopic exam : Glaucoma -evidence of pallor . Extraocular movements  in vertical and horizontal planes intact and  without nystagmus. Visual fields by finger perimetry are intact.Hearing to finger rub intact.  Facial sensation intact to fine touch.  Facial motor strength is symmetric and tongue and uvula move midline. Shoulder shrug was symmetrical.   Motor exam:  Normal tone, muscle bulk and symmetric strength in all extremities. There is bilateral atrophy of the thenar eminence and the abductor digiti. He has likely carpal tunnel.   Sensory:  Fine touch, pinprick and vibration were tested in all extremities. Proprioception tested in the upper extremities was normal. Coordination: Rapid alternating movements in the fingers/hands was normal. Finger-to-nose maneuver  normal without evidence of ataxia, dysmetria or tremor. Gait and station: Patient walks without assistive device .Tandem gait deferred. Turns with 3 -4 Steps. Romberg testing is negative. Deep tendon reflexes: in the  upper and lower extremities are symmetric and intact.     Assessment:  After physical and neurologic examination, review of laboratory studies,  Personal review of imaging studies, reports of other /same  Imaging studies, results of polysomnography and / or neurophysiology testing and pre-existing records as far as provided in visit., my assessment is   1) the patient presents with an extensive cardiac history, surgical history and long-standing sleep apnea of complex character, which has remained untreated given that his wife is witnessing apnea and snoring but nightly it would be necessary to treat him in a split-night polysomnography to at first determine the degree of apnea for the second half of the night treat him with CPAP accordingly.  It is well possible that his subtype of apnea is complex or central apnea given the underlying cardiac risk factors. 2)We will especially monitor for hypoxemia and hypercapnia.  Since the patient has advised me of his claustrophobia I would also want him to be not  exposed to a full facemask but to  a nasal pillow on nasal mask only.  And I understand that this may mean to sacrifice some optimization pressure due to oral air venting.  3) In addition, I have found that the patient has atrophy of the thenar eminence bilaterally, this usually correlates with carpal tunnel syndrome.  He has lost over 30 pounds since surgery, I do not think that this is weight loss related.  Overall his current body mass index is 28.    The patient was advised of the nature of the diagnosed disorder , the treatment options and the  risks for general health and wellness arising from not treating the condition.   I spent more than 55 minutes of face to face time with the patient.  Greater than 50% of time was spent in counseling and coordination of care. We have discussed the diagnosis and differential and I answered the patient's questions.    Plan:  Treatment plan and additional workup :  I will order a split-night polysomnography, and allow the patient to use a sleep inducing medication such as Xanax if needed. We will order capnography, and I would allow oxygen titration showed his apnea index be under 10.  At an AHI of 20 up with or without prolonged oxygen desaturation I will insist on the study being split.    Cc Dr Lonnie Hansen.    Lonnie Seat, MD 64/04/345, 4:25 PM  Certified in Neurology by ABPN Certified in [place] by [facility] [address], [place], [place] [address]

## 2016-11-30 NOTE — Patient Instructions (Signed)

## 2016-12-02 ENCOUNTER — Telehealth (HOSPITAL_COMMUNITY): Payer: Self-pay

## 2016-12-03 ENCOUNTER — Encounter (HOSPITAL_COMMUNITY): Payer: Self-pay

## 2016-12-03 ENCOUNTER — Encounter (HOSPITAL_COMMUNITY)
Admission: RE | Admit: 2016-12-03 | Discharge: 2016-12-03 | Disposition: A | Discharge status: Home / Self Care | Payer: Medicare Other | Source: Ambulatory Visit | Attending: Cardiology | Admitting: Cardiology

## 2016-12-03 VITALS — BP 122/80 | HR 89 | Ht 67.5 in | Wt 186.9 lb

## 2016-12-03 DIAGNOSIS — Z952 Presence of prosthetic heart valve: Secondary | ICD-10-CM

## 2016-12-03 NOTE — Progress Notes (Signed)
Cardiac Rehab Medication Review by a Pharmacist  Does the patient  feel that his/her medications are working for him/her?  yes  Has the patient been experiencing any side effects to the medications prescribed?  no  Does the patient measure his/her own blood pressure or blood glucose at home?  yes   Does the patient have any problems obtaining medications due to transportation or finances?   no  Understanding of regimen: excellent Understanding of indications: good Potential of compliance: good    Lonnie Hansen says he has no issues taking his medications.    Lonnie Gave RN BSN 12/03/2016 2:12 PM

## 2016-12-03 NOTE — Progress Notes (Signed)
Cardiac Individual Treatment Plan  Patient Details  Name: Lonnie Hansen MRN: [id number] Date of Birth: [birth year] Referring Provider:     CARDIAC REHAB PHASE II ORIENTATION from 12/03/2016 in [place]  Referring Provider  Jordan,Peter MD      Initial Encounter Date:    CARDIAC REHAB PHASE II ORIENTATION from 12/03/2016 in [place]  Date  12/03/16  Referring Provider  Jordan,Peter MD      Visit Diagnosis: S/P AVR (aortic valve replacement)  Patient's Home Medications on Admission:  Current Outpatient Medications:  .  amLODipine (NORVASC) 10 MG tablet, Take 10 mg by mouth daily., Disp: , Rfl:  .  aspirin EC 81 MG EC tablet, Take 1 tablet (81 mg total) by mouth daily., Disp: , Rfl:  .  atenolol (TENORMIN) 25 MG tablet, Take 0.5 tablets (12.5 mg total) by mouth 2 (two) times daily., Disp: 30 tablet, Rfl: 1 .  Ferrous Sulfate (IRON) 325 (65 Fe) MG TABS, Take 1 tablet by mouth daily., Disp: , Rfl:  .  furosemide (LASIX) 20 MG tablet, Take 1 tablet (20 mg total) by mouth daily., Disp: 30 tablet, Rfl: 3 .  latanoprost (XALATAN) 0.005 % ophthalmic solution, Place 1 drop into both eyes at bedtime., Disp: , Rfl:  .  Multiple Vitamin (MULTIVITAMIN WITH MINERALS) TABS tablet, Take 1 tablet by mouth daily., Disp: , Rfl:  .  pantoprazole (PROTONIX) 40 MG tablet, Take 40 mg by mouth daily as needed (indigestion). , Disp: , Rfl:   Past Medical History: Past Medical History:  Diagnosis Date  . Anemia   . Arthritis   . Asymptomatic bilateral carotid artery stenosis 08/2015   1-39%   . Blood dyscrasia    "trouble with my bloods clotting"  . Bruising    on the skin states due to platelets or sometimes low or high  . Cataracts, bilateral   . Coronary artery disease   . Diverticulosis   . Enlarged aorta ([place])   . Enlarged prostate    slightly  . GERD (gastroesophageal reflux disease)    takes Pantoprazole daily as needed  .  Glaucoma    uses eye drops daily  . Headache   . History of colon polyps    benign  . History of kidney stones   . Hyperlipidemia    no on any meds  . Hypertension    takes Amlodipine and Atenolol daily  . Joint pain   . Joint swelling   . Nocturia   . Sleep apnea   . Vocal cord nodule    pt. states  it's a" growth on vocal cord"    Tobacco Use: Social History   Tobacco Use  Smoking Status Former Smoker  . Packs/day: 1.00  . Years: 25.00  . Pack years: 25.00  . Types: Cigarettes  Smokeless Tobacco Never Used    Labs: Recent Review Flowsheet Data    Labs for ITP Cardiac and Pulmonary Rehab Latest Ref Rng & Units 09/17/2016 09/18/2016 09/19/2016 09/20/2016 10/07/2016   Cholestrol 100 - 199 mg/dL - - - - 135   LDLCALC 0 - 99 mg/dL - - - - 86   HDL >39 mg/dL - - - - 34(L)   Trlycerides 0 - 149 mg/dL - - - - 75   Hemoglobin A1c 4.8 - 5.6 % - - - - -   PHART 7.350 - 7.450 - - - - -   PCO2ART 32.0 - 48.0 mmHg - - - - -  HCO3 20.0 - 28.0 mmol/L - - - - -   TCO2 0 - 100 mmol/L - - - - -   ACIDBASEDEF 0.0 - 2.0 mmol/L - - - - -   O2SAT % 55.5 81.3 50.6 59.9 -      Capillary Blood Glucose: Lab Results  Component Value Date   GLUCAP 116 (H) 09/21/2016   GLUCAP 105 (H) 09/21/2016   GLUCAP 133 (H) 09/20/2016   GLUCAP 106 (H) 09/20/2016   GLUCAP 110 (H) 09/20/2016     Exercise Target Goals: Date: 12/03/16  Exercise Program Goal: Individual exercise prescription set with THRR, safety & activity barriers. Participant demonstrates ability to understand and report RPE using BORG scale, to self-measure pulse accurately, and to acknowledge the importance of the exercise prescription.  Exercise Prescription Goal: Starting with aerobic activity 30 plus minutes a day, 3 days per week for initial exercise prescription. Provide home exercise prescription and guidelines that participant acknowledges understanding prior to discharge.  Activity Barriers & Risk  Stratification: Activity Barriers & Cardiac Risk Stratification - 12/03/16 1352      Activity Barriers & Cardiac Risk Stratification   Activity Barriers  Muscular Weakness;Deconditioning;Incisional Pain;Assistive Device    Cardiac Risk Stratification  High       6 Minute Walk: 6 Minute Walk    Row Name 12/03/16 1352 12/03/16 1603       6 Minute Walk   Phase  Initial  Initial    Distance  -  1006 feet    Walk Time  -  6 minutes    # of Rest Breaks  -  0    MPH  -  1.91    METS  -  2.48    RPE  -  13    VO2 Peak  -  8.67    Symptoms  -  Yes (comment)    Comments  -  L hip pain 3/10    Resting HR  -  89 bpm    Resting BP  -  122/80    Resting Oxygen Saturation   -  97 %    Exercise Oxygen Saturation  during 6 min walk  -  98 %    Max Ex. HR  -  107 bpm    Max Ex. BP  -  118/72       Oxygen Initial Assessment:   Oxygen Re-Evaluation:   Oxygen Discharge (Final Oxygen Re-Evaluation):   Initial Exercise Prescription: Initial Exercise Prescription - 12/03/16 1600      Date of Initial Exercise RX and Referring Provider   Date  12/03/16    Referring Provider  Jordan,Peter MD      Recumbant Bike   Level  2    Minutes  10    METs  1.5      NuStep   Level  2    SPM  70    Minutes  10    METs  2      Arm Ergometer   Level  1    Minutes  10    METs  1      Prescription Details   Frequency (times per week)  3    Duration  Progress to 30 minutes of continuous aerobic without signs/symptoms of physical distress      Intensity   THRR 40-80% of Max Heartrate  61-122    Ratings of Perceived Exertion  11-13    Perceived Dyspnea  0-4  Progression   Progression  Continue to progress workloads to maintain intensity without signs/symptoms of physical distress.      Resistance Training   Training Prescription  Yes    Weight  2lbs    Reps  10-15       Perform Capillary Blood Glucose checks as needed.  Exercise Prescription Changes:   Exercise  Comments:   Exercise Goals and Review: Exercise Goals    Row Name 12/03/16 1415             Exercise Goals   Increase Physical Activity  Yes       Intervention  Provide advice, education, support and counseling about physical activity/exercise needs.;Develop an individualized exercise prescription for aerobic and resistive training based on initial evaluation findings, risk stratification, comorbidities and participant's personal goals.       Expected Outcomes  Achievement of increased cardiorespiratory fitness and enhanced flexibility, muscular endurance and strength shown through measurements of functional capacity and personal statement of participant.       Increase Strength and Stamina  Yes       Intervention  Provide advice, education, support and counseling about physical activity/exercise needs.;Develop an individualized exercise prescription for aerobic and resistive training based on initial evaluation findings, risk stratification, comorbidities and participant's personal goals.       Expected Outcomes  Achievement of increased cardiorespiratory fitness and enhanced flexibility, muscular endurance and strength shown through measurements of functional capacity and personal statement of participant.       Able to understand and use rate of perceived exertion (RPE) scale  Yes       Intervention  Provide education and explanation on how to use RPE scale       Expected Outcomes  Long Term:  Able to use RPE to guide intensity level when exercising independently;Short Term: Able to use RPE daily in rehab to express subjective intensity level       Knowledge and understanding of Target Heart Rate Range (THRR)  Yes       Intervention  Provide education and explanation of THRR including how the numbers were predicted and where they are located for reference       Expected Outcomes  Long Term: Able to use THRR to govern intensity when exercising independently;Short Term: Able to use daily as  guideline for intensity in rehab;Short Term: Able to state/look up THRR       Able to check pulse independently  Yes       Intervention  Provide education and demonstration on how to check pulse in carotid and radial arteries.;Review the importance of being able to check your own pulse for safety during independent exercise       Expected Outcomes  Short Term: Able to explain why pulse checking is important during independent exercise;Long Term: Able to check pulse independently and accurately       Understanding of Exercise Prescription  Yes       Intervention  Provide education, explanation, and written materials on patient's individual exercise prescription       Expected Outcomes  Short Term: Able to explain program exercise prescription;Long Term: Able to explain home exercise prescription to exercise independently          Exercise Goals Re-Evaluation :    Discharge Exercise Prescription (Final Exercise Prescription Changes):   Nutrition:  Target Goals: Understanding of nutrition guidelines, daily intake of sodium 1500mg , cholesterol 200mg , calories 30% from fat and 7% or less from saturated fats, daily  to have 5 or more servings of fruits and vegetables.  Biometrics: Pre Biometrics - 12/03/16 1608      Pre Biometrics   Waist Circumference  39.5 inches    Hip Circumference  43 inches    Waist to Hip Ratio  0.92 %    Triceps Skinfold  22 mm    % Body Fat  29.3 %    Grip Strength  24 kg    Flexibility  8 in    Single Leg Stand  17 seconds        Nutrition Therapy Plan and Nutrition Goals:   Nutrition Discharge: Nutrition Scores:   Nutrition Goals Re-Evaluation:   Nutrition Goals Re-Evaluation:   Nutrition Goals Discharge (Final Nutrition Goals Re-Evaluation):   Psychosocial: Target Goals: Acknowledge presence or absence of significant depression and/or stress, maximize coping skills, provide positive support system. Participant is able to verbalize types and  ability to use techniques and skills needed for reducing stress and depression.  Initial Review & Psychosocial Screening: Initial Psych Review & Screening - 12/03/16 1628      Initial Review   Current issues with  Current Depression;Current Anxiety/Panic;Current Stress Concerns rates as low   rates as low   Source of Stress Concerns  Chronic Illness      Family Dynamics   Good Support System?  Yes Family, Faith and Fraternity - [employer]   Family, Faith and [employer] - [employer]     Barriers   Psychosocial barriers to participate in program  The patient should benefit from training in stress management and relaxation.      Screening Interventions   Interventions  Encouraged to exercise       Quality of Life Scores: Quality of Life - 12/03/16 1612      Quality of Life Scores   Health/Function Pre  20.57 %    Socioeconomic Pre  23.94 %    Psych/Spiritual Pre  25.86 %    Family Pre  30 %    GLOBAL Pre  23.84 %       PHQ-9: Recent Review Flowsheet Data    There is no flowsheet data to display.     Interpretation of Total Score  Total Score Depression Severity:  1-4 = Minimal depression, 5-9 = Mild depression, 10-14 = Moderate depression, 15-19 = Moderately severe depression, 20-27 = Severe depression   Psychosocial Evaluation and Intervention:   Psychosocial Re-Evaluation:   Psychosocial Discharge (Final Psychosocial Re-Evaluation):   Vocational Rehabilitation: Provide vocational rehab assistance to qualifying candidates.   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 Participant will state  understanding/return demonstration of topics presented.  Learning Barriers/Preferences: Learning Barriers/Preferences - 12/03/16 1351      Learning Barriers/Preferences   Learning Barriers  Sight    Learning Preferences  Written Material;Verbal Instruction;Skilled Demonstration;Pictoral;Computer/Internet;Video       Education Topics: Count Your Pulse:  -Group instruction provided by verbal instruction, demonstration, patient participation and written materials to support subject.  Instructors address importance of being able to find your pulse and how to count your pulse when at home without a heart monitor.  Patients get hands on  experience counting their pulse with staff help and individually.   Heart Attack, Angina, and Risk Factor Modification:  -Group instruction provided by verbal instruction, video, and written materials to support subject.  Instructors address signs and symptoms of angina and heart attacks.    Also discuss risk factors for heart disease and how to make changes to improve heart health risk factors.   Functional Fitness:  -Group instruction provided by verbal instruction, demonstration, patient participation, and written materials to support subject.  Instructors address safety measures for doing things around the house.  Discuss how to get up and down off the floor, how to pick things up properly, how to safely get out of a chair without assistance, and balance training.   Meditation and Mindfulness:  -Group instruction provided by verbal instruction, patient participation, and written materials to support subject.  Instructor addresses importance of mindfulness and meditation practice to help reduce stress and improve awareness.  Instructor also leads participants through a meditation exercise.    Stretching for Flexibility and Mobility:  -Group instruction provided by verbal instruction, patient participation, and written materials to support subject.   Instructors lead participants through series of stretches that are designed to increase flexibility thus improving mobility.  These stretches are additional exercise for major muscle groups that are typically performed during regular warm up and cool down.   Hands Only CPR:  -Group verbal, video, and participation provides a basic overview of AHA guidelines for community CPR. Role-play of emergencies allow participants the opportunity to practice calling for help and chest compression technique with discussion of AED use.   Hypertension: -Group verbal and written instruction that provides a basic overview of hypertension including the most recent diagnostic guidelines, risk factor reduction with self-care instructions and medication management.    Nutrition I class: Heart Healthy Eating:  -Group instruction provided by PowerPoint slides, verbal discussion, and written materials to support subject matter. The instructor gives an explanation and review of the Therapeutic Lifestyle Changes diet recommendations, which includes a discussion on lipid goals, dietary fat, sodium, fiber, plant stanol/sterol esters, sugar, and the components of a well-balanced, healthy diet.   Nutrition II class: Lifestyle Skills:  -Group instruction provided by PowerPoint slides, verbal discussion, and written materials to support subject matter. The instructor gives an explanation and review of label reading, grocery shopping for heart health, heart healthy recipe modifications, and ways to make healthier choices when eating out.   Diabetes Question & Answer:  -Group instruction provided by PowerPoint slides, verbal discussion, and written materials to support subject matter. The instructor gives an explanation and review of diabetes co-morbidities, pre- and post-prandial blood glucose goals, pre-exercise blood glucose goals, signs, symptoms, and treatment of hypoglycemia and hyperglycemia, and foot care  basics.   Diabetes Blitz:  -Group instruction provided by PowerPoint slides, verbal discussion, and written materials to support subject matter. The instructor gives an explanation and review of the physiology behind type 1 and type 2 diabetes, diabetes medications and rational behind using different medications, pre- and post-prandial blood glucose recommendations and Hemoglobin A1c goals, diabetes diet, and exercise including blood glucose guidelines for exercising safely.    Portion Distortion:  -Group instruction provided by PowerPoint slides, verbal discussion, written materials, and food models to support subject matter. The instructor gives an explanation of serving size versus portion size, changes in portions sizes over the last 20 years, and what consists of a serving from each food group.   Stress Management:  -Group instruction provided by  verbal instruction, video, and written materials to support subject matter.  Instructors review role of stress in heart disease and how to cope with stress positively.     Exercising on Your Own:  -Group instruction provided by verbal instruction, power point, and written materials to support subject.  Instructors discuss benefits of exercise, components of exercise, frequency and intensity of exercise, and end points for exercise.  Also discuss use of nitroglycerin and activating EMS.  Review options of places to exercise outside of rehab.  Review guidelines for sex with heart disease.   Cardiac Drugs I:  -Group instruction provided by verbal instruction and written materials to support subject.  Instructor reviews cardiac drug classes: antiplatelets, anticoagulants, beta blockers, and statins.  Instructor discusses reasons, side effects, and lifestyle considerations for each drug class.   Cardiac Drugs II:  -Group instruction provided by verbal instruction and written materials to support subject.  Instructor reviews cardiac drug classes:  angiotensin converting enzyme inhibitors (ACE-I), angiotensin II receptor blockers (ARBs), nitrates, and calcium channel blockers.  Instructor discusses reasons, side effects, and lifestyle considerations for each drug class.   Anatomy and Physiology of the Circulatory System:  Group verbal and written instruction and models provide basic cardiac anatomy and physiology, with the coronary electrical and arterial systems. Review of: AMI, Angina, Valve disease, Heart Failure, Peripheral Artery Disease, Cardiac Arrhythmia, Pacemakers, and the ICD.   Other Education:  -Group or individual verbal, written, or video instructions that support the educational goals of the cardiac rehab program.   Knowledge Questionnaire Score: Knowledge Questionnaire Score - 12/03/16 1603      Knowledge Questionnaire Score   Pre Score  20/24       Core Components/Risk Factors/Patient Goals at Admission: Personal Goals and Risk Factors at Admission - 12/03/16 1610      Core Components/Risk Factors/Patient Goals on Admission    Weight Management  Yes;Weight Maintenance;Weight Loss    Intervention  Weight Management: Provide education and appropriate resources to help participant work on and attain dietary goals.;Weight Management: Develop a combined nutrition and exercise program designed to reach desired caloric intake, while maintaining appropriate intake of nutrient and fiber, sodium and fats, and appropriate energy expenditure required for the weight goal.;Weight Management/Obesity: Establish reasonable short term and long term weight goals.    Admit Weight  186 lb 15.2 oz (84.8 kg)    Goal Weight: Short Term  180 lb (81.6 kg)    Goal Weight: Long Term  175 lb (79.4 kg)    Expected Outcomes  Short Term: Continue to assess and modify interventions until short term weight is achieved;Long Term: Adherence to nutrition and physical activity/exercise program aimed toward attainment of established weight goal;Weight  Maintenance: Understanding of the daily nutrition guidelines, which includes 25-35% calories from fat, 7% or less cal from saturated fats, less than 200mg  cholesterol, less than 1.5gm of sodium, & 5 or more servings of fruits and vegetables daily;Weight Loss: Understanding of general recommendations for a balanced deficit meal plan, which promotes 1-2 lb weight loss per week and includes a negative energy balance of [phone] kcal/d;Understanding recommendations for meals to include 15-35% energy as protein, 25-35% energy from fat, 35-60% energy from carbohydrates, less than 200mg  of dietary cholesterol, 20-35 gm of total fiber daily;Understanding of distribution of calorie intake throughout the day with the consumption of 4-5 meals/snacks    Hypertension  Yes    Intervention  Provide education on lifestyle modifcations including regular physical activity/exercise, weight management, moderate sodium restriction  and increased consumption of fresh fruit, vegetables, and low fat dairy, alcohol moderation, and smoking cessation.;Monitor prescription use compliance.    Expected Outcomes  Short Term: Continued assessment and intervention until BP is < 140/27mm HG in hypertensive participants. < 130/77mm HG in hypertensive participants with diabetes, heart failure or chronic kidney disease.;Long Term: Maintenance of blood pressure at goal levels.    Lipids  Yes    Intervention  Provide education and support for participant on nutrition & aerobic/resistive exercise along with prescribed medications to achieve LDL 70mg , HDL >40mg .    Expected Outcomes  Short Term: Participant states understanding of desired cholesterol values and is compliant with medications prescribed. Participant is following exercise prescription and nutrition guidelines.;Long Term: Cholesterol controlled with medications as prescribed, with individualized exercise RX and with personalized nutrition plan. Value goals: LDL < 70mg , HDL > 40 mg.        Core Components/Risk Factors/Patient Goals Review:    Core Components/Risk Factors/Patient Goals at Discharge (Final Review):    ITP Comments: ITP Comments    Row Name 12/03/16 1344           ITP Comments  Dr. Fransico Him, Medical Director          Comments:  Patient attended orientation from 1330 to 1549 to review rules and guidelines for program. Completed 6 minute walk test, Intitial ITP, and exercise prescription.  VSS. Telemetry- SR with occ pvc.  Pt asymptomatic with complaints of chronic left hip pain.  Pt used wheelchair for stability and support.  Brief psychosocial assessment reveals no immediate barriers to participating in cardiac rehab. Pt feels low level of stress related to his health. Pt heart surgery was in August and his recovery has been slow.  Pt is looking forward to participating and will benefit from stress management and relaxation. Cherre Huger, BSN Cardiac and Training and development officer

## 2016-12-04 NOTE — Progress Notes (Signed)
SISTO GRANILLO 66 y.o. male DOB: 1950-10-03 MRN: [id number]      Nutrition Note  1. S/P AVR (aortic valve replacement)    Past Medical History:  Diagnosis Date  . Anemia   . Arthritis   . Asymptomatic bilateral carotid artery stenosis 08/2015   1-39%   . Blood dyscrasia    "trouble with my bloods clotting"  . Bruising    on the skin states due to platelets or sometimes low or high  . Cataracts, bilateral   . Coronary artery disease   . Diverticulosis   . Enlarged aorta ([place])   . Enlarged prostate    slightly  . GERD (gastroesophageal reflux disease)    takes Pantoprazole daily as needed  . Glaucoma    uses eye drops daily  . Headache   . History of colon polyps    benign  . History of kidney stones   . Hyperlipidemia    no on any meds  . Hypertension    takes Amlodipine and Atenolol daily  . Joint pain   . Joint swelling   . Nocturia   . Sleep apnea   . Vocal cord nodule    pt. states  it's a" growth on vocal cord"   Meds reviewed.   HT: Ht Readings from Last 1 Encounters:  12/03/16 5' 7.5" (1.715 m)    WT: Wt Readings from Last 3 Encounters:  12/03/16 186 lb 15.2 oz (84.8 kg)  11/30/16 187 lb (84.8 kg)  11/19/16 183 lb 3.2 oz (83.1 kg)     BMI 28.3   Current tobacco use? No  Labs:  Lipid Panel     Component Value Date/Time   CHOL 135 10/07/2016 0812   TRIG 75 10/07/2016 0812   HDL 34 (L) 10/07/2016 0812   CHOLHDL 4.0 10/07/2016 0812   CHOLHDL 3.5 04/28/2010 0703   VLDL 14 04/28/2010 0703   LDLCALC 86 10/07/2016 0812    Lab Results  Component Value Date   HGBA1C 5.3 09/11/2016   CBG (last 3)  No results for input(s): GLUCAP in the last 72 hours.  Nutrition Note Spoke with pt. Nutrition plan and goals reviewed with pt. Pt reports he lost 20 lb  before his heart surgery; pre-op wt reportedly 210 lb. Due to decreased appetite, pt states his wt decreased to 175 lb. Pt has gained 11 lb from lowest body wt with increased appetite. Pt wants to  lose wt back down to 175 lb. Wt loss tips discussed.  Pt expressed understanding of the information reviewed. Pt aware of nutrition education classes offered.  Nutrition Diagnosis ? Food-and nutrition-related knowledge deficit related to lack of exposure to information as related to diagnosis of: ? CVD  ? Overweight related to excessive energy intake as evidenced by a BMI of 28.8  Nutrition Intervention ? Pt's individual nutrition plan and goals reviewed with pt. ? Pt given handouts for: ? Nutrition I class ? Nutrition II class   Nutrition Goal(s):  ? Pt to identify food quantities necessary to achieve weight loss of 6-11 lb at graduation from cardiac rehab. Goal wt of 175 lb desired.   Plan:  Pt to attend nutrition classes ? Nutrition I ? Nutrition II ? Portion Distortion  Will provide client-centered nutrition education as part of interdisciplinary care.   Monitor and evaluate progress toward nutrition goal with team.  Derek Mound, M.Ed, RD, LDN, CDE 12/04/2016 2:17 PM

## 2016-12-07 ENCOUNTER — Encounter (HOSPITAL_COMMUNITY)
Admission: RE | Admit: 2016-12-07 | Discharge: 2016-12-07 | Disposition: A | Discharge status: Home / Self Care | Payer: Medicare Other | Source: Ambulatory Visit | Attending: Cardiology | Admitting: Cardiology

## 2016-12-07 ENCOUNTER — Encounter (HOSPITAL_COMMUNITY): Payer: Self-pay

## 2016-12-07 DIAGNOSIS — Z952 Presence of prosthetic heart valve: Secondary | ICD-10-CM

## 2016-12-07 NOTE — Progress Notes (Signed)
Daily Session Note  Patient Details  Name: Lonnie Hansen MRN: [id number] Date of Birth: 07/07/1950 Referring Provider:     CARDIAC REHAB PHASE II ORIENTATION from 12/03/2016 in [facility]  Referring Provider  Jordan,Peter MD      Encounter Date: 12/07/2016  Check In: Session Check In - 12/07/16 1429      Check-In   Staff Present  Molly diVincenzo, MS, ACSM RCEP, Exercise Physiologist;Tyara Nevels, MS,ACSM CEP, Exercise Physiologist; , RN, BSN;Maria Whitaker, RN, BSN    Supervising physician immediately available to respond to emergencies  [facility] immediately available    Physician(s)  Dr Choi    Medication changes reported      No    Fall or balance concerns reported     No    Tobacco Cessation  No Change    Warm-up and Cool-down  Performed as group-led instruction    Resistance Training Performed  Yes    VAD Patient?  No      Pain Assessment   Currently in Pain?  No/denies       Capillary Blood Glucose: No results found for this or any previous visit (from the past 24 hour(s)).    Social History   Tobacco Use  Smoking Status Former Smoker  . Packs/day: 1.00  . Years: 25.00  . Pack years: 25.00  . Types: Cigarettes  Smokeless Tobacco Never Used    Goals Met:  Exercise tolerated well  Goals Unmet:  Not Applicable  Comments: Pt started cardiac rehab today.  Pt tolerated light exercise without difficulty. VSS, telemetry-sinus rhythm, asymptomatic.  Medication list reconciled. Pt denies barriers to medicaiton compliance.  PSYCHOSOCIAL ASSESSMENT:  PHQ-0.  Pt exhibits positive coping skills, hopeful outlook with supportive family. No psychosocial needs identified at this time, no psychosocial interventions necessary.    Pt enjoys playing music.  Pt goals for cardiac rehab are to increase strength/stamina and energy. Pt looking forward to feeling rejuvenated.     Pt oriented to exercise equipment and routine.     Understanding verbalized.   Dr. Traci Turner is Medical Director for Cardiac Rehab at  Hospital. 

## 2016-12-07 NOTE — Progress Notes (Signed)
Cardiology Office Note    Date:  12/08/2016   ID:  COLE EASTRIDGE, DOB 08-02-1950, MRN [id number]  PCP:  [name], MD  Cardiologist:  Dr. Martinique   Chief Complaint  Patient presents with  . Thoracic Aortic Aneurysm    History of Present Illness:  DAVAN NAWABI is a 66 y.o. 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 This past year chest x-ray showed aortic root enlargement. He underwent CT that showed dilatation of proximal aorta. Descending aorta was normal. Echocardiogram obtained on 09/20/2015 showed EF 60-65%, moderate to severe AI, grade 1 DD. He was evaluated by Dr. Servando Snare. More recently, CT scan showed aortic root is enlarged to 5.2 cm. Given the size of aortic root and the degree of AI, it was recommended he undergo aortic root grafting and AVR. He underwent preoperative evaluation with cardiac catheterization on 04/14/2016 which showed moderate to severe AI, EF 45%, 80% stenosis in a nondominant RCA, 50% distal LAD. He eventually underwent the planned procedure with ascending aortic root replacement using 32 mm Gelweave Valsalva Graft and AVR using 29 mm Edwards Perimount Magna Ease aortic bioprosthesis valve on 09/15/2016 by Dr. Servando Snare.  Postoperatively, he did have some volume overload that responded well to diuretic. He also had postoperative thrombocytopenia as well. He was placed on oral amiodarone for postoperative atrial fibrillation.  When seen in follow up chest x-ray showed recurrent right pleural effusion.Echo on 10/09/2016 showed only a trivial amount of pericardial effusion, EF 60-65%. He underwent right thoracentesis again on 10/23/2016 with the removal 2 L of dark blood. Post procedure, he developed a right pneumothorax related to incomplete reexpansion of the right middle and lower lobe of the lung. He was admitted on 10/30/2016 for pigtail drain placement, last chest x-ray obtained on  11/04/2016 showed improvement of right pleural effusion and resolution of right pneumothorax.   On follow up today he is doing well. He denies any shortness of breath. He complains of incisional pain in anterior chest. Some numbness in 3 lateral fingers on right hand. No edema. Appetite has improved. He is actively participating with Cardiac Rehab.   Past Medical History:  Diagnosis Date  . Anemia   . Arthritis   . Asymptomatic bilateral carotid artery stenosis 08/2015   1-39%   . Blood dyscrasia    "trouble with my bloods clotting"  . Bruising    on the skin states due to platelets or sometimes low or high  . Cataracts, bilateral   . Coronary artery disease   . Diverticulosis   . Enlarged aorta ([place])   . Enlarged prostate    slightly  . GERD (gastroesophageal reflux disease)    takes Pantoprazole daily as needed  . Glaucoma    uses eye drops daily  . Headache   . History of colon polyps    benign  . History of kidney stones   . Hyperlipidemia    no on any meds  . Hypertension    takes Amlodipine and Atenolol daily  . Joint pain   . Joint swelling   . Nocturia   . Sleep apnea   . Vocal cord nodule    pt. states  it's a" growth on vocal cord"    Past Surgical History:  Procedure Laterality Date  . COLONOSCOPY    . COLONOSCOPY WITH ESOPHAGOGASTRODUODENOSCOPY (EGD)    . IR THORACENTESIS ASP PLEURAL SPACE W/IMG  GUIDE  10/23/2016    Current Medications: Outpatient Medications Prior to Visit  Medication Sig Dispense Refill  . amLODipine (NORVASC) 10 MG tablet Take 10 mg by mouth daily.    Marland Kitchen aspirin EC 81 MG EC tablet Take 1 tablet (81 mg total) by mouth daily.    Marland Kitchen atenolol (TENORMIN) 25 MG tablet Take 0.5 tablets (12.5 mg total) by mouth 2 (two) times daily. 30 tablet 1  . Ferrous Sulfate (IRON) 325 (65 Fe) MG TABS Take 1 tablet by mouth daily.    . furosemide (LASIX) 20 MG tablet Take 1 tablet (20 mg total) by mouth daily. 30 tablet 3  . latanoprost (XALATAN) 0.005 %  ophthalmic solution Place 1 drop into both eyes at bedtime.    . Multiple Vitamin (MULTIVITAMIN WITH MINERALS) TABS tablet Take 1 tablet by mouth daily.    . pantoprazole (PROTONIX) 40 MG tablet Take 40 mg by mouth daily as needed (indigestion).      No facility-administered medications prior to visit.      Allergies:   Patient has no known allergies.   Social History   Socioeconomic History  . Marital status: Married    Spouse name: None  . Number of children: 1  . Years of education: None  . Highest education level: None  Social Needs  . Financial resource strain: None  . Food insecurity - worry: None  . Food insecurity - inability: None  . Transportation needs - medical: None  . Transportation needs - non-medical: None  Occupational History  . Occupation: Musician  Tobacco Use  . Smoking status: Former Smoker    Packs/day: 1.00    Years: 25.00    Pack years: 25.00    Types: Cigarettes  . Smokeless tobacco: Never Used  Substance and Sexual Activity  . Alcohol use: No    Alcohol/week: 0.0 oz  . Drug use: No  . Sexual activity: None  Other Topics Concern  . None  Social History Narrative   Married.   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     Family History:  The patient's family history includes Heart attack in his father; Heart disease in his father; Thyroid disease in his sister.   ROS:   Please see the history of present illness.    ROS All other systems reviewed and are negative.   PHYSICAL EXAM:   VS:  BP 114/70   Pulse 68   Ht 5' 7.5" (1.715 m)   Wt 189 lb 3.2 oz (85.8 kg)   BMI 29.20 kg/m    GENERAL:  Well appearing HEENT:  PERRL, EOMI, sclera are clear. Oropharynx is clear. NECK:  No jugular venous distention, carotid upstroke brisk and symmetric, no bruits, no thyromegaly or adenopathy LUNGS:  Clear to auscultation bilaterally CHEST:  Keloid in lower portion of sternal incision. HEART:  RRR,  PMI not displaced or sustained,S1 and S2 within  normal limits, no S3, no S4: no clicks, no rubs, no murmurs ABD:  Soft, nontender. BS +, no masses or bruits. No hepatomegaly, no splenomegaly EXT:  2 + pulses throughout, no edema, no cyanosis no clubbing SKIN:  Warm and dry.  No rashes NEURO:  Alert and oriented x 3. Cranial nerves II through XII intact. PSYCH:  Cognitively intact    Wt Readings from Last 3 Encounters:  12/08/16 189 lb 3.2 oz (85.8 kg)  12/03/16 186 lb 15.2 oz (84.8 kg)  11/30/16 187 lb (84.8 kg)  Studies/Labs Reviewed:   EKG:  EKG is not ordered today.    Recent Labs: 09/16/2016: Magnesium 2.2 10/29/2016: ALT 32 10/30/2016: B Natriuretic Peptide 138.4 11/04/2016: BUN 11; Creatinine, Ser 0.87; Hemoglobin 10.8; Platelets 120; Potassium 3.9; Sodium 140   Lipid Panel    Component Value Date/Time   CHOL 135 10/07/2016 0812   TRIG 75 10/07/2016 0812   HDL 34 (L) 10/07/2016 0812   CHOLHDL 4.0 10/07/2016 0812   CHOLHDL 3.5 04/28/2010 0703   VLDL 14 04/28/2010 0703   LDLCALC 86 10/07/2016 0812    Additional studies/ records that were reviewed today include:   Echo 09/20/2015 LV EF: 60% - 65%  Study Conclusions  - Left ventricle: The cavity size was normal. There was moderate focal basal hypertrophy of the septum. Systolic function was normal. The estimated ejection fraction was in the range of 60% to 65%. Wall motion was normal; there were no regional wall motion abnormalities. Doppler parameters are consistent with abnormal left ventricular relaxation (grade 1 diastolic dysfunction). - Aortic valve: There was moderate to severe regurgitation. - Aorta: Aortic root dimension: 43 mm (ED). - Ascending aorta: The ascending aorta was mildly dilated. - Right ventricle: The cavity size was mildly dilated. Wall thickness was normal.    Cath 04/16/2016 Conclusion     Mid RCA lesion, 80 %stenosed.  Ost LAD to Prox LAD lesion, 25 %stenosed.  Dist LAD lesion, 50  %stenosed.  There is mild left ventricular systolic dysfunction.  LV end diastolic pressure is normal.  The left ventricular ejection fraction is 45-50% by visual estimate.  There is no mitral valve regurgitation.  There is no aortic valve stenosis.  LV end diastolic pressure is normal.  1. 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 Mild LV dysfunction. EF 45%. 3. Ascending thoracic aortic aneurysm. 4. 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 5. Normal Right heart And LV filling pressures 6. Normal cardiac output.   Plan: Aortic root grafting and AVR.      Echo 09/16/2016 LV EF: 55% - 60%  Study Conclusions  - Left ventricle: The cavity size was normal. Wall thickness was increased in a pattern of severe LVH. Systolic function was normal. The estimated ejection fraction was in the range of 55% to 60%. - Aortic valve: Valve area (VTI): 4.29 cm^2. Valve area (Vmax): 4.15 cm^2. Valve area (Vmean): 3.88 cm^2. - Mitral valve: Severely calcified annulus. Valve area by continuity equation (using LVOT flow): 3.35 cm^2. - Pericardium, extracardiac: Localized moderate pericardial effusion posterior to the LA.    Limited Echo 10/09/2016 LV EF: 60% -   65%  Study Conclusions  - Left ventricle: The cavity size was normal. Wall thickness was   increased in a pattern of mild LVH. Systolic function was normal.   The estimated ejection fraction was in the range of 60% to 65%. - Aortic valve: AV prosthesis appears to open well Peak and mean   gradients through the valve are 9 and 5 mm Hg respectively. - Mitral valve: Calcified annulus. Mildly thickened leaflets .   There was mild regurgitation. - Pulmonary arteries: PA peak pressure: 37 mm Hg (S). - Pericardium, extracardiac: A trivial pericardial effusion was   identified.   R thoracentesis 10/23/2016 Post CXR: IMPRESSION: Resolution of right-sided pleural effusion. Right pneumothorax is  noted related to incomplete re-expansion of the right middle and lower lobes. This does not represent a true pneumothorax. Continued follow-up is recommended.    ASSESSMENT:    1. S/P  AVR (aortic valve replacement)   2. H/O aortic valve replacement   3. S/P aortic aneurysm repair   4. Coronary artery disease involving native coronary artery of native heart without angina pectoris      PLAN:  In order of problems listed above:  1. H/o Aortic root repair and AVR: he still has some sternal pain but is overall doing well. Repeat Echo in September showed good prosthetic valve function.   2. Recurrent right pleural effusion- post op:now resolved post drainage.   3. CAD: No anginal pain, 80% mid nondominant RCA and a 50% distal LAD lesion on previous cardiac catheterization.  Continue medical therapy.   4. Postop atrial fibrillation: resolved.   5. Hypertension: Blood pressure is well controlled.   6. Hyperlipidemia: On Lipitor 40 mg daily. Will repeat fasting labs in 4 months.     Medication Adjustments/Labs and Tests Ordered: Current medicines are reviewed at length with the patient today.  Concerns regarding medicines are outlined above.  Medication changes, Labs and Tests ordered today are listed in the Patient Instructions below. Patient Instructions  Continue your current therapy  I will see you in 4 months with fasting lab work.      Signed, Dennie Moltz Martinique, MD  12/08/2016 12:40 PM    [address], [place], [place]  [address] Phone: [phone]; Fax: [phone]

## 2016-12-08 ENCOUNTER — Telehealth: Payer: Self-pay

## 2016-12-08 ENCOUNTER — Encounter: Payer: Self-pay | Admitting: Cardiology

## 2016-12-08 ENCOUNTER — Ambulatory Visit (INDEPENDENT_AMBULATORY_CARE_PROVIDER_SITE_OTHER): Payer: Medicare Other | Admitting: Cardiology

## 2016-12-08 ENCOUNTER — Other Ambulatory Visit: Payer: Self-pay

## 2016-12-08 VITALS — BP 114/70 | HR 68 | Ht 67.5 in | Wt 189.2 lb

## 2016-12-08 DIAGNOSIS — I351 Nonrheumatic aortic (valve) insufficiency: Secondary | ICD-10-CM

## 2016-12-08 DIAGNOSIS — I10 Essential (primary) hypertension: Secondary | ICD-10-CM

## 2016-12-08 DIAGNOSIS — I2510 Atherosclerotic heart disease of native coronary artery without angina pectoris: Secondary | ICD-10-CM

## 2016-12-08 DIAGNOSIS — Z9889 Other specified postprocedural states: Secondary | ICD-10-CM | POA: Diagnosis not present

## 2016-12-08 DIAGNOSIS — Z952 Presence of prosthetic heart valve: Secondary | ICD-10-CM | POA: Diagnosis not present

## 2016-12-08 DIAGNOSIS — Z8679 Personal history of other diseases of the circulatory system: Secondary | ICD-10-CM

## 2016-12-08 MED ORDER — POTASSIUM CHLORIDE CRYS ER 20 MEQ PO TBCR: 20.0000 meq | EXTENDED_RELEASE_TABLET | Freq: Every day | ORAL | 3 refills | Status: DC

## 2016-12-08 NOTE — Patient Instructions (Signed)
Continue your current therapy  I will see you in 4 months with fasting lab work.

## 2016-12-08 NOTE — Telephone Encounter (Signed)
Spoke to patient he wanted to know if he needed to take potassium.Dr.Jordan advised to take Kdur 20 meq daily.Prescription sent to pharmacy.

## 2016-12-09 ENCOUNTER — Ambulatory Visit (INDEPENDENT_AMBULATORY_CARE_PROVIDER_SITE_OTHER): Payer: Medicare Other | Admitting: Neurology

## 2016-12-09 ENCOUNTER — Encounter (HOSPITAL_COMMUNITY)
Admission: RE | Admit: 2016-12-09 | Discharge: 2016-12-09 | Disposition: A | Discharge status: Home / Self Care | Payer: Medicare Other | Source: Ambulatory Visit | Attending: Cardiology | Admitting: Cardiology

## 2016-12-09 DIAGNOSIS — Z952 Presence of prosthetic heart valve: Secondary | ICD-10-CM | POA: Diagnosis not present

## 2016-12-09 DIAGNOSIS — I712 Thoracic aortic aneurysm, without rupture: Secondary | ICD-10-CM

## 2016-12-09 DIAGNOSIS — I2510 Atherosclerotic heart disease of native coronary artery without angina pectoris: Secondary | ICD-10-CM

## 2016-12-09 DIAGNOSIS — R0902 Hypoxemia: Secondary | ICD-10-CM

## 2016-12-09 DIAGNOSIS — G4731 Primary central sleep apnea: Secondary | ICD-10-CM

## 2016-12-09 DIAGNOSIS — I7121 Aneurysm of the ascending aorta, without rupture: Secondary | ICD-10-CM

## 2016-12-09 DIAGNOSIS — Z9889 Other specified postprocedural states: Secondary | ICD-10-CM

## 2016-12-09 DIAGNOSIS — I5022 Chronic systolic (congestive) heart failure: Secondary | ICD-10-CM

## 2016-12-11 ENCOUNTER — Encounter (HOSPITAL_COMMUNITY)
Admission: RE | Admit: 2016-12-11 | Discharge: 2016-12-11 | Disposition: A | Discharge status: Home / Self Care | Payer: Medicare Other | Source: Ambulatory Visit | Attending: Cardiology | Admitting: Cardiology

## 2016-12-11 DIAGNOSIS — Z952 Presence of prosthetic heart valve: Secondary | ICD-10-CM

## 2016-12-14 ENCOUNTER — Encounter (HOSPITAL_COMMUNITY)
Admission: RE | Admit: 2016-12-14 | Discharge: 2016-12-14 | Disposition: A | Discharge status: Home / Self Care | Payer: Medicare Other | Source: Ambulatory Visit | Attending: Cardiology | Admitting: Cardiology

## 2016-12-14 DIAGNOSIS — Z952 Presence of prosthetic heart valve: Secondary | ICD-10-CM | POA: Diagnosis not present

## 2016-12-14 DIAGNOSIS — R82998 Other abnormal findings in urine: Secondary | ICD-10-CM | POA: Diagnosis not present

## 2016-12-14 NOTE — Procedures (Signed)
PATIENT'S NAME:  Lonnie Hansen, Lonnie Hansen, DOB:                   18-Feb-1950      MRN#:    [id number]     DATE OF RECORDING: 12/09/2016 REFERRING M.D.:  [name], M.D. Study Performed:  Split-Night Titration Study HISTORY:  This 66 year old Afro-American male Patient presented for OSA testing in 2011, received the diagnosis of OSA, but was not able to initiate CPAP due to insurance issues at the time. He is now re-referred by Dr. Philip Aspen. Pericardial effusion, CAD, open heart surgery, claustrophobia- informed me that he would not want a FFM .  The patient endorsed the Epworth Sleepiness Scale at 3/24 points, FSS at 59 points   The patient's weight 187 pounds with a height of 68 (inches), resulting in a BMI of 28.4 kg/m2.The patient's neck circumference measured 16 inches.  CURRENT MEDICATIONS: Norvasc, Aspirin, Tenormin, Iron, Lasix, Xalatan, Multivitamin, Protonix.  PROCEDURE:  This is a multichannel digital polysomnogram utilizing the Somnostar 11.2 system.  Electrodes and sensors were applied and monitored per AASM Specifications.   EEG, EOG, Chin and Limb EMG, were sampled at 200 Hz.  ECG, Snore and Nasal Pressure, Thermal Airflow, Respiratory Effort, CPAP Flow and Pressure, Oximetry was sampled at 50 Hz. Digital video and audio were recorded.      BASELINE STUDY WITHOUT CPAP RESULTS: Lights Out was at 23:02 and Lights On at 05:18.  Total recording time (TRT) was 153, with a total sleep time (TST) of 120.5 minutes.   The patient's sleep latency was 22.5 minutes.  REM void.  The sleep efficiency was 78.8 %.  SLEEP ARCHITECTURE: WASO (Wake after sleep onset) was 6 minutes, Stage N1 was 5.5 minutes, Stage N2 was 77.5 minutes, Stage N3 was 37.5 minutes and Stage R (REM sleep) was 0 minutes.  The percentages were Stage N1 4.6%, Stage N2 64.3%, Stage N3 31.1% and Stage R (REM sleep) 0%.   RESPIRATORY ANALYSIS:  There were a total of 64 respiratory events:  63 obstructive apneas, 0 central apneas and 1  hypopnea with 0 respiratory event related arousals (RERAs).Snoring was noted.    The total APNEA/HYPOPNEA INDEX (AHI) was 31.9 /hour and the total RESPIRATORY DISTURBANCE INDEX was 31.9 /hour.  0 events occurred in REM sleep and 2 events in NREM. The REM AHI was 0.0 /hour versus a non-REM AHI of 31.9 /hour. The patient spent 319.5 minutes sleep time in the supine position 0 minutes in non-supine. The supine AHI was 31.9 /hour versus a non-supine AHI of 0.0 /hour.  OXYGEN SATURATION & C02:  The wake baseline 02 saturation was 96%, with the lowest being 88%. Time spent below 89% saturation equaled 1 minute. Prior to CPAP, the average End Tidal CO2 during sleep 36.3 torr.  PERIODIC LIMB MOVEMENTS:   The patient had a total of 0 Periodic Limb Movements.  The arousals were noted as: 17 were spontaneous, 0 were associated with PLMs, and 33 were associated with respiratory events. Audio and video analysis did not show any abnormal or unusual movements, behaviors, phonations or vocalization-Snoring was noted. EKG was in keeping with sinus rhythm (NSR) and PVCs.      TITRATION STUDY WITH CPAP RESULTS:   CPAP was initiated at 5 cmH20 with heated humidity per AASM split night standards and pressure was advanced 14 cmH20 because of hypopneas, apneas and desaturations.  At a PAP pressure of 14 cmH20 with 3 cm EPR, there was a  reduction of the AHI to 0.0 /hour.  Total recording time (TRT) was 223.5 minutes, with a total sleep time (TST) of 199 minutes. The patient's sleep latency was 8 minutes. REM latency was 12 minutes.  The sleep efficiency was 89.0 %.    SLEEP ARCHITECTURE: Wake after sleep was 19.5 minutes, Stage N1 7 minutes, Stage N2 147.5 minutes, Stage N3 25.5 minutes and Stage R (REM sleep) 19 minutes. The percentages were: Stage N1 3.5%, Stage N2 74.1%, Stage N3 12.8% and Stage R (REM sleep) 9.5%.   RESPIRATORY ANALYSIS:  There were a total of 13 respiratory events: 13 obstructive apneas, 0 central  apneas and 0 hypopneas with 0 respiratory event related arousals (RERAs).     The total APNEA/HYPOPNEA INDEX (AHI) was 3.9 /hour and the total RESPIRATORY DISTURBANCE INDEX was 3.9 /hour.  3 events occurred in REM sleep and 10 events in NREM. The REM AHI was 9.5 /hour versus a non-REM AHI of 3.3 /hour. REM sleep was achieved on a pressure of 9 cm/H20 (AHI was 8.0/hr.) The patient spent 100% of total sleep time in the supine position. The supine AHI was 3.9 /hour, versus a non-supine AHI of 0.0/hour.  OXYGEN SATURATION & C02:  The wake baseline 02 saturation was 96%, with the lowest being 91%.   PERIODIC LIMB MOVEMENTS:   The patient had a total of 0 Periodic Limb Movements.   Post-study, the patient indicated that sleep was worse than usual. The patient felt bothered by high pressure and cold air flowing.    POLYSOMNOGRAPHY IMPRESSION :   1. Severe Obstructive Sleep Apnea(OSA) and Snoring- AHI was 31.9/hr. , all sleep in NREM and supine position. 2. Sleep apnea responded to CPAP at pressures of 7 and 8 cm water, but achieved REM sleep rebound at CPAP of 14 cm with nadir of 95%.    RECOMMENDATIONS: auto-titration CPAP with a pressure window between 6 and 14 cm water with 3 cm EPR.  1. A Fisher and Paykel FFM SIMPLUS in Medium size was used with heated humidity during this study.  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 I will ask the DME to change to a Wisp or Eson mask to be fitted. Advise to add heated humidity.  Adjust interface and heated humidity as needed.     2. Compliance to PAP therapy should be emphasized as 4 hours minimum use time per night.  Compliance, AHI and air leak information to be downloaded for objective assessment at 30 days, 180 days and annually thereafter.   3. Further information regarding OSA may be obtained from [employer] (www.sleepfoundation.org) or American Sleep Apnea Association  (www.sleepapnea.org). 4. Consider dedicated sleep psychology referral if insomnia is of clinical concern.   5. A follow up appointment will be scheduled in the Sleep Clinic at [facility].      I certify that I have reviewed the entire raw data recording prior to the issuance of this report in accordance with the Standards of Accreditation of the American Academy of Sleep Medicine (AASM)      Larey Seat, M.D. 12-14-2016  Diplomat, American Board of Psychiatry and Neurology  Diplomat, [place] of [facility] Medical Director, [place] [facility]

## 2016-12-15 ENCOUNTER — Telehealth: Payer: Self-pay | Admitting: Neurology

## 2016-12-15 DIAGNOSIS — I10 Essential (primary) hypertension: Secondary | ICD-10-CM | POA: Diagnosis not present

## 2016-12-15 DIAGNOSIS — Z125 Encounter for screening for malignant neoplasm of prostate: Secondary | ICD-10-CM | POA: Diagnosis not present

## 2016-12-15 DIAGNOSIS — E7849 Other hyperlipidemia: Secondary | ICD-10-CM | POA: Diagnosis not present

## 2016-12-15 NOTE — Telephone Encounter (Signed)
I called pt. I advised pt that Dr. Brett Fairy reviewed their sleep study results and found that pt has sleep apnea. Dr. Brett Fairy recommends that pt starts a auto CPAP. I reviewed PAP compliance expectations with the pt. Pt is agreeable to starting an auto-PAP. I advised pt that an order will be sent to a DME, [facility], and [facility] will call the pt within about one week after they file with the pt's insurance. [facility] will show the pt how to use the machine, fit for masks, and troubleshoot the auto-PAP if needed. A follow up appt was made for insurance purposes with Cecille Rubin on Mar 04, 2017 at 2:15 pm . Pt verbalized understanding to arrive 15 minutes early and bring their auto-PAP. A letter with all of this information in it will be mailed to the pt as a reminder. I verified with the pt that the address we have on file is correct. Pt verbalized understanding of results. Pt had no questions at this time but was encouraged to call back if questions arise.

## 2016-12-15 NOTE — Telephone Encounter (Signed)
-----   Message from Larey Seat, MD sent at 12/14/2016  5:23 PM EST ----- Apnea was worse in comp to 2011 study, and responded well to CPAP with 6-14 cm water pressure. Tech changed him form nasal mask to FFM because of oral air venting, but I like to give patient the option of using nasal mask - if not able to tolerate in the first 30 days he can exchange for FFM through DME at no cost to him. ( within  30 days !). Wrote for auto CPAP.   auto-titration CPAP with a pressure window  between 6 and 14 cm water with 3 cm EPR.  1. A [employer] FFM SIMPLUS in Medium size was used by sleep tech with  heated humidity during this study. I advised that the patient  should be tried on a non FFM - explicitly because of Retrognathia  and claustrophobia. I will ask the DME to change to a Wisp or  Eson mask to be fitted. Advise to add heated humidity. Adjust  interface and heated humidity as needed.

## 2016-12-16 ENCOUNTER — Encounter (HOSPITAL_COMMUNITY)
Admission: RE | Admit: 2016-12-16 | Discharge: 2016-12-16 | Disposition: A | Discharge status: Home / Self Care | Payer: Medicare Other | Source: Ambulatory Visit | Attending: Cardiology | Admitting: Cardiology

## 2016-12-16 DIAGNOSIS — Z952 Presence of prosthetic heart valve: Secondary | ICD-10-CM

## 2016-12-21 ENCOUNTER — Encounter (HOSPITAL_COMMUNITY): Payer: Medicare Other

## 2016-12-21 DIAGNOSIS — G4733 Obstructive sleep apnea (adult) (pediatric): Secondary | ICD-10-CM | POA: Diagnosis not present

## 2016-12-21 DIAGNOSIS — E7849 Other hyperlipidemia: Secondary | ICD-10-CM | POA: Diagnosis not present

## 2016-12-21 DIAGNOSIS — R110 Nausea: Secondary | ICD-10-CM | POA: Diagnosis not present

## 2016-12-21 DIAGNOSIS — Z6828 Body mass index (BMI) 28.0-28.9, adult: Secondary | ICD-10-CM | POA: Diagnosis not present

## 2016-12-21 DIAGNOSIS — M545 Low back pain: Secondary | ICD-10-CM | POA: Diagnosis not present

## 2016-12-21 DIAGNOSIS — R05 Cough: Secondary | ICD-10-CM | POA: Diagnosis not present

## 2016-12-21 DIAGNOSIS — I10 Essential (primary) hypertension: Secondary | ICD-10-CM | POA: Diagnosis not present

## 2016-12-21 DIAGNOSIS — D6489 Other specified anemias: Secondary | ICD-10-CM | POA: Diagnosis not present

## 2016-12-21 DIAGNOSIS — Z0000 Encounter for general adult medical examination without abnormal findings: Secondary | ICD-10-CM | POA: Diagnosis not present

## 2016-12-21 DIAGNOSIS — J90 Pleural effusion, not elsewhere classified: Secondary | ICD-10-CM | POA: Diagnosis not present

## 2016-12-21 DIAGNOSIS — H4089 Other specified glaucoma: Secondary | ICD-10-CM | POA: Diagnosis not present

## 2016-12-21 DIAGNOSIS — M25551 Pain in right hip: Secondary | ICD-10-CM | POA: Diagnosis not present

## 2016-12-23 ENCOUNTER — Encounter (HOSPITAL_COMMUNITY)
Admission: RE | Admit: 2016-12-23 | Discharge: 2016-12-23 | Disposition: A | Discharge status: Home / Self Care | Payer: Medicare Other | Source: Ambulatory Visit | Attending: Cardiology | Admitting: Cardiology

## 2016-12-23 DIAGNOSIS — Z952 Presence of prosthetic heart valve: Secondary | ICD-10-CM | POA: Diagnosis not present

## 2016-12-23 NOTE — Progress Notes (Signed)
Reviewed home exercise with pt today.  Pt plans to walk and do handheld weights for exercise, 2x/week in addition to coming to cardiac rehab.  Reviewed THR, pulse, RPE, sign and symptoms, and when to call 911 or MD.  Also discussed weather considerations and indoor options.  Pt voiced understanding.    [employer], ACSM RCEP

## 2016-12-25 ENCOUNTER — Encounter (HOSPITAL_COMMUNITY)
Admission: RE | Admit: 2016-12-25 | Discharge: 2016-12-25 | Disposition: A | Discharge status: Home / Self Care | Payer: Medicare Other | Source: Ambulatory Visit | Attending: Cardiology | Admitting: Cardiology

## 2016-12-25 DIAGNOSIS — Z952 Presence of prosthetic heart valve: Secondary | ICD-10-CM

## 2016-12-25 DIAGNOSIS — Z1212 Encounter for screening for malignant neoplasm of rectum: Secondary | ICD-10-CM | POA: Diagnosis not present

## 2016-12-25 NOTE — Progress Notes (Signed)
Cardiac Individual Treatment Plan  Patient Details  Name: Lonnie Hansen MRN: [id number] Date of Birth: 08/19/1950 Referring Provider:     CARDIAC REHAB PHASE II ORIENTATION from 12/03/2016 in [place]  Referring Provider  Jordan,Peter MD      Initial Encounter Date:    CARDIAC REHAB PHASE II ORIENTATION from 12/03/2016 in [place]  Date  12/03/16  Referring Provider  Jordan,Peter MD      Visit Diagnosis: S/P AVR (aortic valve replacement)  Patient's Home Medications on Admission:  Current Outpatient Medications:  .  amLODipine (NORVASC) 10 MG tablet, Take 10 mg by mouth daily., Disp: , Rfl:  .  aspirin EC 81 MG EC tablet, Take 1 tablet (81 mg total) by mouth daily., Disp: , Rfl:  .  atenolol (TENORMIN) 25 MG tablet, Take 0.5 tablets (12.5 mg total) by mouth 2 (two) times daily., Disp: 30 tablet, Rfl: 1 .  Ferrous Sulfate (IRON) 325 (65 Fe) MG TABS, Take 1 tablet by mouth daily., Disp: , Rfl:  .  furosemide (LASIX) 20 MG tablet, Take 1 tablet (20 mg total) by mouth daily., Disp: 30 tablet, Rfl: 3 .  latanoprost (XALATAN) 0.005 % ophthalmic solution, Place 1 drop into both eyes at bedtime., Disp: , Rfl:  .  Multiple Vitamin (MULTIVITAMIN WITH MINERALS) TABS tablet, Take 1 tablet by mouth daily., Disp: , Rfl:  .  pantoprazole (PROTONIX) 40 MG tablet, Take 40 mg by mouth daily as needed (indigestion). , Disp: , Rfl:  .  potassium chloride SA (K-DUR,KLOR-CON) 20 MEQ tablet, Take 1 tablet (20 mEq total) daily by mouth., Disp: 90 tablet, Rfl: 3  Past Medical History: Past Medical History:  Diagnosis Date  . Anemia   . Arthritis   . Asymptomatic bilateral carotid artery stenosis 08/2015   1-39%   . Blood dyscrasia    "trouble with my bloods clotting"  . Bruising    on the skin states due to platelets or sometimes low or high  . Cataracts, bilateral   . Coronary artery disease   . Diverticulosis   . Enlarged aorta  ([place])   . Enlarged prostate    slightly  . GERD (gastroesophageal reflux disease)    takes Pantoprazole daily as needed  . Glaucoma    uses eye drops daily  . Headache   . History of colon polyps    benign  . History of kidney stones   . Hyperlipidemia    no on any meds  . Hypertension    takes Amlodipine and Atenolol daily  . Joint pain   . Joint swelling   . Nocturia   . Sleep apnea   . Vocal cord nodule    pt. states  it's a" growth on vocal cord"    Tobacco Use: Social History   Tobacco Use  Smoking Status Former Smoker  . Packs/day: 1.00  . Years: 25.00  . Pack years: 25.00  . Types: Cigarettes  Smokeless Tobacco Never Used    Labs: Recent Review Flowsheet Data    Labs for ITP Cardiac and Pulmonary Rehab Latest Ref Rng & Units 09/17/2016 09/18/2016 09/19/2016 09/20/2016 10/07/2016   Cholestrol 100 - 199 mg/dL - - - - 135   LDLCALC 0 - 99 mg/dL - - - - 86   HDL >39 mg/dL - - - - 34(L)   Trlycerides 0 - 149 mg/dL - - - - 75   Hemoglobin A1c 4.8 - 5.6 % - - - - -  PHART 7.350 - 7.450 - - - - -   PCO2ART 32.0 - 48.0 mmHg - - - - -   HCO3 20.0 - 28.0 mmol/L - - - - -   TCO2 0 - 100 mmol/L - - - - -   ACIDBASEDEF 0.0 - 2.0 mmol/L - - - - -   O2SAT % 55.5 81.3 50.6 59.9 -      Capillary Blood Glucose: Lab Results  Component Value Date   GLUCAP 116 (H) 09/21/2016   GLUCAP 105 (H) 09/21/2016   GLUCAP 133 (H) 09/20/2016   GLUCAP 106 (H) 09/20/2016   GLUCAP 110 (H) 09/20/2016     Exercise Target Goals:    Exercise Program Goal: Individual exercise prescription set with THRR, safety & activity barriers. Participant demonstrates ability to understand and report RPE using BORG scale, to self-measure pulse accurately, and to acknowledge the importance of the exercise prescription.  Exercise Prescription Goal: Starting with aerobic activity 30 plus minutes a day, 3 days per week for initial exercise prescription. Provide home exercise prescription and guidelines  that participant acknowledges understanding prior to discharge.  Activity Barriers & Risk Stratification: Activity Barriers & Cardiac Risk Stratification - 12/03/16 1352      Activity Barriers & Cardiac Risk Stratification   Activity Barriers  Muscular Weakness;Deconditioning;Incisional Pain;Assistive Device    Cardiac Risk Stratification  High       6 Minute Walk: 6 Minute Walk    Row Name 12/03/16 1352 12/03/16 1603       6 Minute Walk   Phase  Initial  Initial    Distance  -  1006 feet    Walk Time  -  6 minutes    # of Rest Breaks  -  0    MPH  -  1.91    METS  -  2.48    RPE  -  13    VO2 Peak  -  8.67    Symptoms  -  Yes (comment)    Comments  -  L hip pain 3/10    Resting HR  -  89 bpm    Resting BP  -  122/80    Resting Oxygen Saturation   -  97 %    Exercise Oxygen Saturation  during 6 min walk  -  98 %    Max Ex. HR  -  107 bpm    Max Ex. BP  -  118/72       Oxygen Initial Assessment:   Oxygen Re-Evaluation:   Oxygen Discharge (Final Oxygen Re-Evaluation):   Initial Exercise Prescription: Initial Exercise Prescription - 12/03/16 1600      Date of Initial Exercise RX and Referring Provider   Date  12/03/16    Referring Provider  Jordan,Peter MD      Recumbant Bike   Level  2    Minutes  10    METs  1.5      NuStep   Level  2    SPM  70    Minutes  10    METs  2      Arm Ergometer   Level  1    Minutes  10    METs  1      Prescription Details   Frequency (times per week)  3    Duration  Progress to 30 minutes of continuous aerobic without signs/symptoms of physical distress      Intensity   THRR 40-80% of  Max Heartrate  61-122    Ratings of Perceived Exertion  11-13    Perceived Dyspnea  0-4      Progression   Progression  Continue to progress workloads to maintain intensity without signs/symptoms of physical distress.      Resistance Training   Training Prescription  Yes    Weight  2lbs    Reps  10-15       Perform  Capillary Blood Glucose checks as needed.  Exercise Prescription Changes: Exercise Prescription Changes    Row Name 12/09/16 1615 12/24/16 1600           Response to Exercise   Blood Pressure (Admit)  107/70  122/82      Blood Pressure (Exercise)  142/70  118/70      Blood Pressure (Exit)  108/70  114/70      Heart Rate (Admit)  87 bpm  85 bpm      Heart Rate (Exercise)  94 bpm  96 bpm      Heart Rate (Exit)  75 bpm  66 bpm      Perceived Dyspnea (Exercise)  13  15      Duration  Progress to 30 minutes of  aerobic without signs/symptoms of physical distress  Progress to 30 minutes of  aerobic without signs/symptoms of physical distress      Intensity  THRR unchanged  THRR unchanged        Progression   Progression  Continue to progress workloads to maintain intensity without signs/symptoms of physical distress.  Continue to progress workloads to maintain intensity without signs/symptoms of physical distress.      Average METs  2.3  2.5        Resistance Training   Training Prescription  No Relaxation Day   No        Recumbant Bike   Level  1  2.2      Minutes  15  15      METs  2  2.56        NuStep   Level  1  1      SPM  70  70      Minutes  15  15      METs  2.1  1        Home Exercise Plan   Plans to continue exercise at  -  Home (comment) Walking       Frequency  -  Add 2 additional days to program exercise sessions.      Initial Home Exercises Provided  -  12/23/16         Exercise Comments: Exercise Comments    Row Name 12/24/16 1606           Exercise Comments  Reviewed METs and goals with pt. Will continue to monitor pt's progress.           Exercise Goals and Review: Exercise Goals    Row Name 12/03/16 1415             Exercise Goals   Increase Physical Activity  Yes       Intervention  Provide advice, education, support and counseling about physical activity/exercise needs.;Develop an individualized exercise prescription for aerobic and  resistive training based on initial evaluation findings, risk stratification, comorbidities and participant's personal goals.       Expected Outcomes  Achievement of increased cardiorespiratory fitness and enhanced flexibility, muscular endurance and strength shown through measurements of functional capacity and  personal statement of participant.       Increase Strength and Stamina  Yes       Intervention  Provide advice, education, support and counseling about physical activity/exercise needs.;Develop an individualized exercise prescription for aerobic and resistive training based on initial evaluation findings, risk stratification, comorbidities and participant's personal goals.       Expected Outcomes  Achievement of increased cardiorespiratory fitness and enhanced flexibility, muscular endurance and strength shown through measurements of functional capacity and personal statement of participant.       Able to understand and use rate of perceived exertion (RPE) scale  Yes       Intervention  Provide education and explanation on how to use RPE scale       Expected Outcomes  Long Term:  Able to use RPE to guide intensity level when exercising independently;Short Term: Able to use RPE daily in rehab to express subjective intensity level       Knowledge and understanding of Target Heart Rate Range (THRR)  Yes       Intervention  Provide education and explanation of THRR including how the numbers were predicted and where they are located for reference       Expected Outcomes  Long Term: Able to use THRR to govern intensity when exercising independently;Short Term: Able to use daily as guideline for intensity in rehab;Short Term: Able to state/look up THRR       Able to check pulse independently  Yes       Intervention  Provide education and demonstration on how to check pulse in carotid and radial arteries.;Review the importance of being able to check your own pulse for safety during independent exercise        Expected Outcomes  Short Term: Able to explain why pulse checking is important during independent exercise;Long Term: Able to check pulse independently and accurately       Understanding of Exercise Prescription  Yes       Intervention  Provide education, explanation, and written materials on patient's individual exercise prescription       Expected Outcomes  Short Term: Able to explain program exercise prescription;Long Term: Able to explain home exercise prescription to exercise independently          Exercise Goals Re-Evaluation : Exercise Goals Re-Evaluation    Row Name 12/23/16 1553 12/24/16 1606           Exercise Goal Re-Evaluation   Exercise Goals Review  Increase Physical Activity;Understanding of Exercise Prescription;Increase Strength and Stamina;Knowledge and understanding of Target Heart Rate Range (THRR);Able to understand and use rate of perceived exertion (RPE) scale;Able to check pulse independently  Increase Physical Activity;Understanding of Exercise Prescription;Increase Strength and Stamina;Knowledge and understanding of Target Heart Rate Range (THRR);Able to understand and use rate of perceived exertion (RPE) scale;Able to check pulse independently      Comments  Reviewed home exercise with pt today.  Pt plans to walk and do handheld weights for exercise, 2x/week in addition to coming to cardiac rehab.  Reviewed THR, pulse, RPE, sign and symptoms, and when to call 911 or MD.  Also discussed weather considerations and indoor options.  Pt voiced understanding.  -      Expected Outcomes  Pt will be compliant with HEP and improve in aerobic and functional fitness  -          Discharge Exercise Prescription (Final Exercise Prescription Changes): Exercise Prescription Changes - 12/24/16 1600      Response  to Exercise   Blood Pressure (Admit)  122/82    Blood Pressure (Exercise)  118/70    Blood Pressure (Exit)  114/70    Heart Rate (Admit)  85 bpm    Heart Rate  (Exercise)  96 bpm    Heart Rate (Exit)  66 bpm    Perceived Dyspnea (Exercise)  15    Duration  Progress to 30 minutes of  aerobic without signs/symptoms of physical distress    Intensity  THRR unchanged      Progression   Progression  Continue to progress workloads to maintain intensity without signs/symptoms of physical distress.    Average METs  2.5      Resistance Training   Training Prescription  No      Recumbant Bike   Level  2.2    Minutes  15    METs  2.56      NuStep   Level  1    SPM  70    Minutes  15    METs  1      Home Exercise Plan   Plans to continue exercise at  Home (comment) Walking     Frequency  Add 2 additional days to program exercise sessions.    Initial Home Exercises Provided  12/23/16       Nutrition:  Target Goals: Understanding of nutrition guidelines, daily intake of sodium 1500mg , cholesterol 200mg , calories 30% from fat and 7% or less from saturated fats, daily to have 5 or more servings of fruits and vegetables.  Biometrics: Pre Biometrics - 12/03/16 1608      Pre Biometrics   Waist Circumference  39.5 inches    Hip Circumference  43 inches    Waist to Hip Ratio  0.92 %    Triceps Skinfold  22 mm    % Body Fat  29.3 %    Grip Strength  24 kg    Flexibility  8 in    Single Leg Stand  17 seconds        Nutrition Therapy Plan and Nutrition Goals: Nutrition Therapy & Goals - 12/04/16 1422      Nutrition Therapy   Diet  Therapeutic Lifestyle Changes      Personal Nutrition Goals   Nutrition Goal  Pt to identify food quantities necessary to achieve weight loss of 6-11 lb at graduation from cardiac rehab. Goal wt of 175 lb desired.       Intervention Plan   Intervention  Prescribe, educate and counsel regarding individualized specific dietary modifications aiming towards targeted core components such as weight, hypertension, lipid management, diabetes, heart failure and other comorbidities.    Expected Outcomes  Short Term  Goal: Understand basic principles of dietary content, such as calories, fat, sodium, cholesterol and nutrients.;Long Term Goal: Adherence to prescribed nutrition plan.       Nutrition Discharge: Nutrition Scores:   Nutrition Goals Re-Evaluation:   Nutrition Goals Re-Evaluation:   Nutrition Goals Discharge (Final Nutrition Goals Re-Evaluation):   Psychosocial: Target Goals: Acknowledge presence or absence of significant depression and/or stress, maximize coping skills, provide positive support system. Participant is able to verbalize types and ability to use techniques and skills needed for reducing stress and depression.  Initial Review & Psychosocial Screening: Initial Psych Review & Screening - 12/03/16 1628      Initial Review   Current issues with  Current Depression;Current Anxiety/Panic;Current Stress Concerns rates as low    Source of Stress Concerns  Chronic Illness  Family Dynamics   Good Support System?  Yes Family, Faith and Fraternity - [employer]      Barriers   Psychosocial barriers to participate in program  The patient should benefit from training in stress management and relaxation.      Screening Interventions   Interventions  Encouraged to exercise       Quality of Life Scores: Quality of Life - 12/03/16 1612      Quality of Life Scores   Health/Function Pre  20.57 %    Socioeconomic Pre  23.94 %    Psych/Spiritual Pre  25.86 %    Family Pre  30 %    GLOBAL Pre  23.84 %       PHQ-9: Recent Review Flowsheet Data    Depression screen [facility] 2/9 12/07/2016   Decreased Interest 0   Down, Depressed, Hopeless 0   PHQ - 2 Score 0     Interpretation of Total Score  Total Score Depression Severity:  1-4 = Minimal depression, 5-9 = Mild depression, 10-14 = Moderate depression, 15-19 = Moderately severe depression, 20-27 = Severe depression   Psychosocial Evaluation and Intervention: Psychosocial Evaluation - 12/07/16 1524      Psychosocial  Evaluation & Interventions   Interventions  Encouraged to exercise with the program and follow exercise prescription;Relaxation education;Stress management education    Comments  no psychosocial needs identified, no interventions necessary     Expected Outcomes  pt will exhibit positive outlook with good coping skills.     Continue Psychosocial Services   No Follow up required       Psychosocial Re-Evaluation: Psychosocial Re-Evaluation    [place] Name 12/25/16 1659             Psychosocial Re-Evaluation   Current issues with  None Identified       Comments  no psychosocial needs identified, no interventions necessary        Expected Outcomes  pt will exhibit positive outlook with good coping skills.        Interventions  Encouraged to attend Cardiac Rehabilitation for the exercise       Continue Psychosocial Services   No Follow up required          Psychosocial Discharge (Final Psychosocial Re-Evaluation): Psychosocial Re-Evaluation - 12/25/16 1659      Psychosocial Re-Evaluation   Current issues with  None Identified    Comments  no psychosocial needs identified, no interventions necessary     Expected Outcomes  pt will exhibit positive outlook with good coping skills.     Interventions  Encouraged to attend Cardiac Rehabilitation for the exercise    Continue Psychosocial Services   No Follow up required       Vocational Rehabilitation: Provide vocational rehab assistance to qualifying candidates.   Vocational Rehab Evaluation & Intervention: Vocational Rehab - 12/07/16 1524      Initial Vocational Rehab Evaluation & Intervention   Assessment shows need for Vocational Rehabilitation  No       Education: Education Goals: Education classes will be provided on a weekly basis, covering required topics. Participant will state understanding/return demonstration of topics presented.  Learning Barriers/Preferences: Learning Barriers/Preferences - 12/03/16 1351       Learning Barriers/Preferences   Learning Barriers  Sight    Learning Preferences  Written Material;Verbal Instruction;Skilled Demonstration;Pictoral;Computer/Internet;Video       Education Topics: Count Your Pulse:  -Group instruction provided by verbal instruction, demonstration, patient participation and written materials to support  subject.  Instructors address importance of being able to find your pulse and how to count your pulse when at home without a heart monitor.  Patients get hands on experience counting their pulse with staff help and individually.   Heart Attack, Angina, and Risk Factor Modification:  -Group instruction provided by verbal instruction, video, and written materials to support subject.  Instructors address signs and symptoms of angina and heart attacks.    Also discuss risk factors for heart disease and how to make changes to improve heart health risk factors.   Functional Fitness:  -Group instruction provided by verbal instruction, demonstration, patient participation, and written materials to support subject.  Instructors address safety measures for doing things around the house.  Discuss how to get up and down off the floor, how to pick things up properly, how to safely get out of a chair without assistance, and balance training.   Meditation and Mindfulness:  -Group instruction provided by verbal instruction, patient participation, and written materials to support subject.  Instructor addresses importance of mindfulness and meditation practice to help reduce stress and improve awareness.  Instructor also leads participants through a meditation exercise.    Stretching for Flexibility and Mobility:  -Group instruction provided by verbal instruction, patient participation, and written materials to support subject.  Instructors lead participants through series of stretches that are designed to increase flexibility thus improving mobility.  These stretches are  additional exercise for major muscle groups that are typically performed during regular warm up and cool down.   Hands Only CPR:  -Group verbal, video, and participation provides a basic overview of AHA guidelines for community CPR. Role-play of emergencies allow participants the opportunity to practice calling for help and chest compression technique with discussion of AED use.   Hypertension: -Group verbal and written instruction that provides a basic overview of hypertension including the most recent diagnostic guidelines, risk factor reduction with self-care instructions and medication management.    Nutrition I class: Heart Healthy Eating:  -Group instruction provided by PowerPoint slides, verbal discussion, and written materials to support subject matter. The instructor gives an explanation and review of the Therapeutic Lifestyle Changes diet recommendations, which includes a discussion on lipid goals, dietary fat, sodium, fiber, plant stanol/sterol esters, sugar, and the components of a well-balanced, healthy diet.   Nutrition II class: Lifestyle Skills:  -Group instruction provided by PowerPoint slides, verbal discussion, and written materials to support subject matter. The instructor gives an explanation and review of label reading, grocery shopping for heart health, heart healthy recipe modifications, and ways to make healthier choices when eating out.   Diabetes Question & Answer:  -Group instruction provided by PowerPoint slides, verbal discussion, and written materials to support subject matter. The instructor gives an explanation and review of diabetes co-morbidities, pre- and post-prandial blood glucose goals, pre-exercise blood glucose goals, signs, symptoms, and treatment of hypoglycemia and hyperglycemia, and foot care basics.   Diabetes Blitz:  -Group instruction provided by PowerPoint slides, verbal discussion, and written materials to support subject matter. The  instructor gives an explanation and review of the physiology behind type 1 and type 2 diabetes, diabetes medications and rational behind using different medications, pre- and post-prandial blood glucose recommendations and Hemoglobin A1c goals, diabetes diet, and exercise including blood glucose guidelines for exercising safely.    Portion Distortion:  -Group instruction provided by PowerPoint slides, verbal discussion, written materials, and food models to support subject matter. The instructor gives an explanation of serving size versus  portion size, changes in portions sizes over the last 20 years, and what consists of a serving from each food group.   Stress Management:  -Group instruction provided by verbal instruction, video, and written materials to support subject matter.  Instructors review role of stress in heart disease and how to cope with stress positively.     Exercising on Your Own:  -Group instruction provided by verbal instruction, power point, and written materials to support subject.  Instructors discuss benefits of exercise, components of exercise, frequency and intensity of exercise, and end points for exercise.  Also discuss use of nitroglycerin and activating EMS.  Review options of places to exercise outside of rehab.  Review guidelines for sex with heart disease.   Cardiac Drugs I:  -Group instruction provided by verbal instruction and written materials to support subject.  Instructor reviews cardiac drug classes: antiplatelets, anticoagulants, beta blockers, and statins.  Instructor discusses reasons, side effects, and lifestyle considerations for each drug class.   CARDIAC REHAB PHASE II EXERCISE from 12/09/2016 in [place]  Date  12/09/16  Instruction Review Code  2- meets goals/outcomes      Cardiac Drugs II:  -Group instruction provided by verbal instruction and written materials to support subject.  Instructor reviews cardiac  drug classes: angiotensin converting enzyme inhibitors (ACE-I), angiotensin II receptor blockers (ARBs), nitrates, and calcium channel blockers.  Instructor discusses reasons, side effects, and lifestyle considerations for each drug class.   Anatomy and Physiology of the Circulatory System:  Group verbal and written instruction and models provide basic cardiac anatomy and physiology, with the coronary electrical and arterial systems. Review of: AMI, Angina, Valve disease, Heart Failure, Peripheral Artery Disease, Cardiac Arrhythmia, Pacemakers, and the ICD.   Other Education:  -Group or individual verbal, written, or video instructions that support the educational goals of the cardiac rehab program.   Knowledge Questionnaire Score: Knowledge Questionnaire Score - 12/03/16 1603      Knowledge Questionnaire Score   Pre Score  20/24       Core Components/Risk Factors/Patient Goals at Admission: Personal Goals and Risk Factors at Admission - 12/03/16 1610      Core Components/Risk Factors/Patient Goals on Admission    Weight Management  Yes;Weight Maintenance;Weight Loss    Intervention  Weight Management: Provide education and appropriate resources to help participant work on and attain dietary goals.;Weight Management: Develop a combined nutrition and exercise program designed to reach desired caloric intake, while maintaining appropriate intake of nutrient and fiber, sodium and fats, and appropriate energy expenditure required for the weight goal.;Weight Management/Obesity: Establish reasonable short term and long term weight goals.    Admit Weight  186 lb 15.2 oz (84.8 kg)    Goal Weight: Short Term  180 lb (81.6 kg)    Goal Weight: Long Term  175 lb (79.4 kg)    Expected Outcomes  Short Term: Continue to assess and modify interventions until short term weight is achieved;Long Term: Adherence to nutrition and physical activity/exercise program aimed toward attainment of established weight  goal;Weight Maintenance: Understanding of the daily nutrition guidelines, which includes 25-35% calories from fat, 7% or less cal from saturated fats, less than 200mg  cholesterol, less than 1.5gm of sodium, & 5 or more servings of fruits and vegetables daily;Weight Loss: Understanding of general recommendations for a balanced deficit meal plan, which promotes 1-2 lb weight loss per week and includes a negative energy balance of [phone] kcal/d;Understanding recommendations for meals to include 15-35% energy  as protein, 25-35% energy from fat, 35-60% energy from carbohydrates, less than 200mg  of dietary cholesterol, 20-35 gm of total fiber daily;Understanding of distribution of calorie intake throughout the day with the consumption of 4-5 meals/snacks    Hypertension  Yes    Intervention  Provide education on lifestyle modifcations including regular physical activity/exercise, weight management, moderate sodium restriction and increased consumption of fresh fruit, vegetables, and low fat dairy, alcohol moderation, and smoking cessation.;Monitor prescription use compliance.    Expected Outcomes  Short Term: Continued assessment and intervention until BP is < 140/18mm HG in hypertensive participants. < 130/38mm HG in hypertensive participants with diabetes, heart failure or chronic kidney disease.;Long Term: Maintenance of blood pressure at goal levels.    Lipids  Yes    Intervention  Provide education and support for participant on nutrition & aerobic/resistive exercise along with prescribed medications to achieve LDL 70mg , HDL >40mg .    Expected Outcomes  Short Term: Participant states understanding of desired cholesterol values and is compliant with medications prescribed. Participant is following exercise prescription and nutrition guidelines.;Long Term: Cholesterol controlled with medications as prescribed, with individualized exercise RX and with personalized nutrition plan. Value goals: LDL < 70mg , HDL >  40 mg.       Core Components/Risk Factors/Patient Goals Review:  Goals and Risk Factor Review    Row Name 12/25/16 1658             Core Components/Risk Factors/Patient Goals Review   Personal Goals Review  Weight Management/Obesity;Hypertension;Lipids       Review  pt with multiple CAD RF demonstrates eagerness to participate in CR opportunities.        Expected Outcomes  pt will participate in CR exercise, nutrition and lifestyle modification opportunities to decrease overall RF.            Core Components/Risk Factors/Patient Goals at Discharge (Final Review):  Goals and Risk Factor Review - 12/25/16 1658      Core Components/Risk Factors/Patient Goals Review   Personal Goals Review  Weight Management/Obesity;Hypertension;Lipids    Review  pt with multiple CAD RF demonstrates eagerness to participate in CR opportunities.     Expected Outcomes  pt will participate in CR exercise, nutrition and lifestyle modification opportunities to decrease overall RF.         ITP Comments: ITP Comments    Row Name 12/03/16 1344 12/07/16 1521 12/25/16 1657       ITP Comments  Dr. Fransico Him, Medical Director  pt participated in first exercise day without difficulty.   30 day ITP review.  pt with good attendance and participation.         Comments:

## 2016-12-28 ENCOUNTER — Encounter (HOSPITAL_COMMUNITY)
Admission: RE | Admit: 2016-12-28 | Discharge: 2016-12-28 | Disposition: A | Discharge status: Home / Self Care | Payer: Medicare Other | Source: Ambulatory Visit | Attending: Cardiology | Admitting: Cardiology

## 2016-12-28 DIAGNOSIS — Z952 Presence of prosthetic heart valve: Secondary | ICD-10-CM | POA: Insufficient documentation

## 2016-12-30 ENCOUNTER — Telehealth (HOSPITAL_COMMUNITY): Payer: Self-pay | Admitting: Internal Medicine

## 2016-12-30 ENCOUNTER — Encounter (HOSPITAL_COMMUNITY): Payer: Medicare Other

## 2016-12-31 ENCOUNTER — Encounter: Payer: Self-pay | Admitting: Speech Pathology

## 2016-12-31 NOTE — Therapy (Signed)
SLP Long Term Goals - 12/31/16 1739      SLP LONG TERM GOAL #1   Title  Pt will report adhering to reflux precautions over 2 sessions.    Status  Not Met      SLP LONG TERM GOAL #2   Title  Pt will self-report improvements in swallowing function as measured on EAT-10.    Status  Not Met      SLP LONG TERM GOAL #3   Title  Pt will report increased intake of solid foods with use of texture alterations and compensatory techniques for oral clearance.    Status  Achieved        SPEECH THERAPY DISCHARGE SUMMARY  Visits from Start of Care: 2  Current functional level related to goals / functional outcomes: Pt was seen for 2 visits targeting oropharyngeal dysphagia. See goals above.     Remaining deficits: At time of d/c, pt required extended time and use of strategies for PO intake and taking in limited amounts. Seen by SLP during acute hospitalization for pleural effusion; at that time pt reported improved swallowing function and no further ST was recommended.    Education / Equipment: GERD management Plan: Patient agrees to discharge.  Patient goals were not met. Patient is being discharged due to not returning since the last visit.  ?????         [facility] [address] [place], [place], [address] Phone: [phone]   Fax:  [phone]  Patient Details  Name: Lonnie Hansen MRN: [id number] Date of Birth: 11-Sep-1950 Referring Provider:  No ref. provider found  Encounter Date: 12/31/2016  Deneise Lever, [place], [place] 12/31/2016, 5:40 PM  [facility] [address] [place], [place], [address] Phone: [phone]   Fax:  [phone]

## 2017-01-01 ENCOUNTER — Encounter (HOSPITAL_COMMUNITY)
Admission: RE | Admit: 2017-01-01 | Discharge: 2017-01-01 | Disposition: A | Discharge status: Home / Self Care | Payer: Medicare Other | Source: Ambulatory Visit | Attending: Cardiology | Admitting: Cardiology

## 2017-01-01 DIAGNOSIS — Z952 Presence of prosthetic heart valve: Secondary | ICD-10-CM | POA: Diagnosis not present

## 2017-01-04 ENCOUNTER — Encounter (HOSPITAL_COMMUNITY): Payer: Medicare Other

## 2017-01-06 ENCOUNTER — Encounter (HOSPITAL_COMMUNITY): Payer: Medicare Other

## 2017-01-08 ENCOUNTER — Encounter (HOSPITAL_COMMUNITY)
Admission: RE | Admit: 2017-01-08 | Discharge: 2017-01-08 | Disposition: A | Discharge status: Home / Self Care | Payer: Medicare Other | Source: Ambulatory Visit | Attending: Cardiology | Admitting: Cardiology

## 2017-01-08 DIAGNOSIS — Z952 Presence of prosthetic heart valve: Secondary | ICD-10-CM | POA: Diagnosis not present

## 2017-01-11 ENCOUNTER — Encounter (HOSPITAL_COMMUNITY)
Admission: RE | Admit: 2017-01-11 | Discharge: 2017-01-11 | Disposition: A | Discharge status: Home / Self Care | Payer: Medicare Other | Source: Ambulatory Visit | Attending: Cardiology | Admitting: Cardiology

## 2017-01-11 DIAGNOSIS — Z952 Presence of prosthetic heart valve: Secondary | ICD-10-CM | POA: Diagnosis not present

## 2017-01-11 NOTE — Progress Notes (Signed)
Lonnie Hansen 66 y.o. male DOB: Oct 15, 1950 MRN: [id number]      Nutrition  1. S/P AVR (aortic valve replacement)    Note Spoke with pt. Nutrition plan and survey reviewed. Pt wt today is 192.7 lb (87.6 kg). On admission, pt stated he wants to lose wt back down to 175 lb. Pt feels wt gain due to increased appetite and "now I can eat real food not just those supplement drinks." Wt loss tips discussed. Pt expressed understanding of the information reviewed.   Nutrition Diagnosis ? Food-and nutrition-related knowledge deficit related to lack of exposure to information as related to diagnosis of: ? CVD  ? Overweight related to excessive energy intake as evidenced by a BMI of 28.8  Nutrition Intervention ? Pt's individual nutrition plan and goals reviewed with pt. ? Benefits of following a Heart Healthy diet discussed when MEDFICTS results reviewed.    ? Pt given handouts for: ? Nutrition I class ? Nutrition II class  Nutrition Goal(s):  ? Pt to identify food quantities necessary to achieve weight loss of 6-11 lb at graduation from cardiac rehab. Goal wt of 175 lb desired.   Plan:  Pt to attend nutrition classes ? Nutrition I ? Nutrition II ? Portion Distortion  Will provide client-centered nutrition education as part of interdisciplinary care.   Monitor and evaluate progress toward nutrition goal with team.  Derek Mound, M.Ed, RD, LDN, CDE 01/11/2017 2:53 PM

## 2017-01-13 ENCOUNTER — Encounter (HOSPITAL_COMMUNITY)
Admission: RE | Admit: 2017-01-13 | Discharge: 2017-01-13 | Disposition: A | Discharge status: Home / Self Care | Payer: Medicare Other | Source: Ambulatory Visit | Attending: Cardiology | Admitting: Cardiology

## 2017-01-13 DIAGNOSIS — Z952 Presence of prosthetic heart valve: Secondary | ICD-10-CM

## 2017-01-15 ENCOUNTER — Encounter (HOSPITAL_COMMUNITY)
Admission: RE | Admit: 2017-01-15 | Discharge: 2017-01-15 | Disposition: A | Discharge status: Home / Self Care | Payer: Medicare Other | Source: Ambulatory Visit | Attending: Cardiology | Admitting: Cardiology

## 2017-01-15 DIAGNOSIS — Z952 Presence of prosthetic heart valve: Secondary | ICD-10-CM

## 2017-01-20 ENCOUNTER — Encounter (HOSPITAL_COMMUNITY)
Admission: RE | Admit: 2017-01-20 | Discharge: 2017-01-20 | Disposition: A | Discharge status: Home / Self Care | Payer: Medicare Other | Source: Ambulatory Visit | Attending: Cardiology | Admitting: Cardiology

## 2017-01-20 DIAGNOSIS — Z952 Presence of prosthetic heart valve: Secondary | ICD-10-CM

## 2017-01-22 ENCOUNTER — Telehealth (HOSPITAL_COMMUNITY): Payer: Self-pay | Admitting: *Deleted

## 2017-01-22 ENCOUNTER — Encounter (HOSPITAL_COMMUNITY): Payer: Medicare Other

## 2017-01-22 NOTE — Progress Notes (Signed)
Cardiac Individual Treatment Plan  Patient Details  Name: Lonnie Hansen MRN: [id number] Date of Birth: [birth year] Referring Provider:     CARDIAC REHAB PHASE II ORIENTATION from 12/03/2016 in [place]  Referring Provider  Jordan,Peter MD      Initial Encounter Date:    CARDIAC REHAB PHASE II ORIENTATION from 12/03/2016 in [facility]  Date  12/03/16  Referring Provider  Jordan,Peter MD      Visit Diagnosis: S/P AVR (aortic valve replacement)  Patient's Home Medications on Admission:  Current Outpatient Medications:  .  amLODipine (NORVASC) 10 MG tablet, Take 10 mg by mouth daily., Disp: , Rfl:  .  aspirin EC 81 MG EC tablet, Take 1 tablet (81 mg total) by mouth daily., Disp: , Rfl:  .  atenolol (TENORMIN) 25 MG tablet, Take 0.5 tablets (12.5 mg total) by mouth 2 (two) times daily., Disp: 30 tablet, Rfl: 1 .  Ferrous Sulfate (IRON) 325 (65 Fe) MG TABS, Take 1 tablet by mouth daily., Disp: , Rfl:  .  furosemide (LASIX) 20 MG tablet, Take 1 tablet (20 mg total) by mouth daily., Disp: 30 tablet, Rfl: 3 .  latanoprost (XALATAN) 0.005 % ophthalmic solution, Place 1 drop into both eyes at bedtime., Disp: , Rfl:  .  Multiple Vitamin (MULTIVITAMIN WITH MINERALS) TABS tablet, Take 1 tablet by mouth daily., Disp: , Rfl:  .  pantoprazole (PROTONIX) 40 MG tablet, Take 40 mg by mouth daily as needed (indigestion). , Disp: , Rfl:  .  potassium chloride SA (K-DUR,KLOR-CON) 20 MEQ tablet, Take 1 tablet (20 mEq total) daily by mouth., Disp: 90 tablet, Rfl: 3  Past Medical History: Past Medical History:  Diagnosis Date  . Anemia   . Arthritis   . Asymptomatic bilateral carotid artery stenosis 08/2015   1-39%   . Blood dyscrasia    "trouble with my bloods clotting"  . Bruising    on the skin states due to platelets or sometimes low or high  . Cataracts, bilateral   . Coronary artery disease   . Diverticulosis   . Enlarged aorta  ([place])   . Enlarged prostate    slightly  . GERD (gastroesophageal reflux disease)    takes Pantoprazole daily as needed  . Glaucoma    uses eye drops daily  . Headache   . History of colon polyps    benign  . History of kidney stones   . Hyperlipidemia    no on any meds  . Hypertension    takes Amlodipine and Atenolol daily  . Joint pain   . Joint swelling   . Nocturia   . Sleep apnea   . Vocal cord nodule    pt. states  it's a" growth on vocal cord"    Tobacco Use: Social History   Tobacco Use  Smoking Status Former Smoker  . Packs/day: 1.00  . Years: 25.00  . Pack years: 25.00  . Types: Cigarettes  Smokeless Tobacco Never Used    Labs: Recent Review Flowsheet Data    Labs for ITP Cardiac and Pulmonary Rehab Latest Ref Rng & Units 09/17/2016 09/18/2016 09/19/2016 09/20/2016 10/07/2016   Cholestrol 100 - 199 mg/dL - - - - 135   LDLCALC 0 - 99 mg/dL - - - - 86   HDL >39 mg/dL - - - - 34(L)   Trlycerides 0 - 149 mg/dL - - - - 75   Hemoglobin A1c 4.8 - 5.6 % - - - - -  PHART 7.350 - 7.450 - - - - -   PCO2ART 32.0 - 48.0 mmHg - - - - -   HCO3 20.0 - 28.0 mmol/L - - - - -   TCO2 0 - 100 mmol/L - - - - -   ACIDBASEDEF 0.0 - 2.0 mmol/L - - - - -   O2SAT % 55.5 81.3 50.6 59.9 -      Capillary Blood Glucose: Lab Results  Component Value Date   GLUCAP 116 (H) 09/21/2016   GLUCAP 105 (H) 09/21/2016   GLUCAP 133 (H) 09/20/2016   GLUCAP 106 (H) 09/20/2016   GLUCAP 110 (H) 09/20/2016     Exercise Target Goals:    Exercise Program Goal: Individual exercise prescription set with THRR, safety & activity barriers. Participant demonstrates ability to understand and report RPE using BORG scale, to self-measure pulse accurately, and to acknowledge the importance of the exercise prescription.  Exercise Prescription Goal: Starting with aerobic activity 30 plus minutes a day, 3 days per week for initial exercise prescription. Provide home exercise prescription and guidelines  that participant acknowledges understanding prior to discharge.  Activity Barriers & Risk Stratification: Activity Barriers & Cardiac Risk Stratification - 12/03/16 1352      Activity Barriers & Cardiac Risk Stratification   Activity Barriers  Muscular Weakness;Deconditioning;Incisional Pain;Assistive Device    Cardiac Risk Stratification  High       6 Minute Walk: 6 Minute Walk    Row Name 12/03/16 1352 12/03/16 1603       6 Minute Walk   Phase  Initial  Initial    Distance  -  1006 feet    Walk Time  -  6 minutes    # of Rest Breaks  -  0    MPH  -  1.91    METS  -  2.48    RPE  -  13    VO2 Peak  -  8.67    Symptoms  -  Yes (comment)    Comments  -  L hip pain 3/10    Resting HR  -  89 bpm    Resting BP  -  122/80    Resting Oxygen Saturation   -  97 %    Exercise Oxygen Saturation  during 6 min walk  -  98 %    Max Ex. HR  -  107 bpm    Max Ex. BP  -  118/72       Oxygen Initial Assessment:   Oxygen Re-Evaluation:   Oxygen Discharge (Final Oxygen Re-Evaluation):   Initial Exercise Prescription: Initial Exercise Prescription - 12/03/16 1600      Date of Initial Exercise RX and Referring Provider   Date  12/03/16    Referring Provider  Jordan,Peter MD      Recumbant Bike   Level  2    Minutes  10    METs  1.5      NuStep   Level  2    SPM  70    Minutes  10    METs  2      Arm Ergometer   Level  1    Minutes  10    METs  1      Prescription Details   Frequency (times per week)  3    Duration  Progress to 30 minutes of continuous aerobic without signs/symptoms of physical distress      Intensity   THRR 40-80% of  Max Heartrate  61-122    Ratings of Perceived Exertion  11-13    Perceived Dyspnea  0-4      Progression   Progression  Continue to progress workloads to maintain intensity without signs/symptoms of physical distress.      Resistance Training   Training Prescription  Yes    Weight  2lbs    Reps  10-15       Perform  Capillary Blood Glucose checks as needed.  Exercise Prescription Changes: Exercise Prescription Changes    Row Name 12/09/16 1615 12/24/16 1600 01/01/17 1653 01/13/17 1600       Response to Exercise   Blood Pressure (Admit)  107/70  122/82  120/80  118/78    Blood Pressure (Exercise)  142/70  118/70  124/60  126/80    Blood Pressure (Exit)  108/70  114/70  110/74  110/80    Heart Rate (Admit)  87 bpm  85 bpm  86 bpm  73 bpm    Heart Rate (Exercise)  94 bpm  96 bpm  94 bpm  84 bpm    Heart Rate (Exit)  75 bpm  66 bpm  83 bpm  62 bpm    Perceived Dyspnea (Exercise)  13  15  13  14     Duration  Progress to 30 minutes of  aerobic without signs/symptoms of physical distress  Progress to 30 minutes of  aerobic without signs/symptoms of physical distress  Progress to 30 minutes of  aerobic without signs/symptoms of physical distress  Progress to 30 minutes of  aerobic without signs/symptoms of physical distress    Intensity  THRR unchanged  THRR unchanged  THRR unchanged  THRR unchanged      Progression   Progression  Continue to progress workloads to maintain intensity without signs/symptoms of physical distress.  Continue to progress workloads to maintain intensity without signs/symptoms of physical distress.  Continue to progress workloads to maintain intensity without signs/symptoms of physical distress.  Continue to progress workloads to maintain intensity without signs/symptoms of physical distress.    Average METs  2.3  2.5  2.4  2.3      Resistance Training   Training Prescription  No Relaxation Day   No  Yes  No    Weight  -  -  2lbs  -    Reps  -  -  10-15  -    Time  -  -  10 Minutes  -      Interval Training   Interval Training  -  -  No  No      Recumbant Bike   Level  1  2.2  -  -    Minutes  15  15  -  -    METs  2  2.56  -  -      NuStep   Level  1  1  4  4     SPM  70  70  70  70    Minutes  15  15  15  15     METs  2.1  1  2.3  2.2      Arm Ergometer   Level  -  -   1  1    Minutes  -  -  15  15    METs  -  -  2.51  2.49      Home Exercise Plan   Plans to continue exercise at  -  Home (  comment) Walking   Home (comment) Walking   Home (comment) Walking     Frequency  -  Add 2 additional days to program exercise sessions.  Add 2 additional days to program exercise sessions.  Add 2 additional days to program exercise sessions.    Initial Home Exercises Provided  -  12/23/16  12/23/16  12/23/16       Exercise Comments: Exercise Comments    Row Name 12/24/16 1606 01/13/17 1658         Exercise Comments  Reviewed METs and goals with pt. Will continue to monitor pt's progress.   Pt is responding well to exercise. Pt will continue to work on increasing his physical activity levels at home by walking.          Exercise Goals and Review: Exercise Goals    Row Name 12/03/16 1415             Exercise Goals   Increase Physical Activity  Yes       Intervention  Provide advice, education, support and counseling about physical activity/exercise needs.;Develop an individualized exercise prescription for aerobic and resistive training based on initial evaluation findings, risk stratification, comorbidities and participant's personal goals.       Expected Outcomes  Achievement of increased cardiorespiratory fitness and enhanced flexibility, muscular endurance and strength shown through measurements of functional capacity and personal statement of participant.       Increase Strength and Stamina  Yes       Intervention  Provide advice, education, support and counseling about physical activity/exercise needs.;Develop an individualized exercise prescription for aerobic and resistive training based on initial evaluation findings, risk stratification, comorbidities and participant's personal goals.       Expected Outcomes  Achievement of increased cardiorespiratory fitness and enhanced flexibility, muscular endurance and strength shown through measurements of  functional capacity and personal statement of participant.       Able to understand and use rate of perceived exertion (RPE) scale  Yes       Intervention  Provide education and explanation on how to use RPE scale       Expected Outcomes  Long Term:  Able to use RPE to guide intensity level when exercising independently;Short Term: Able to use RPE daily in rehab to express subjective intensity level       Knowledge and understanding of Target Heart Rate Range (THRR)  Yes       Intervention  Provide education and explanation of THRR including how the numbers were predicted and where they are located for reference       Expected Outcomes  Long Term: Able to use THRR to govern intensity when exercising independently;Short Term: Able to use daily as guideline for intensity in rehab;Short Term: Able to state/look up THRR       Able to check pulse independently  Yes       Intervention  Provide education and demonstration on how to check pulse in carotid and radial arteries.;Review the importance of being able to check your own pulse for safety during independent exercise       Expected Outcomes  Short Term: Able to explain why pulse checking is important during independent exercise;Long Term: Able to check pulse independently and accurately       Understanding of Exercise Prescription  Yes       Intervention  Provide education, explanation, and written materials on patient's individual exercise prescription       Expected Outcomes  Short  Term: Able to explain program exercise prescription;Long Term: Able to explain home exercise prescription to exercise independently          Exercise Goals Re-Evaluation : Exercise Goals Re-Evaluation    Row Name 12/23/16 1553 12/24/16 1606 01/13/17 1656         Exercise Goal Re-Evaluation   Exercise Goals Review  Increase Physical Activity;Understanding of Exercise Prescription;Increase Strength and Stamina;Knowledge and understanding of Target Heart Rate Range  (THRR);Able to understand and use rate of perceived exertion (RPE) scale;Able to check pulse independently  Increase Physical Activity;Understanding of Exercise Prescription;Increase Strength and Stamina;Knowledge and understanding of Target Heart Rate Range (THRR);Able to understand and use rate of perceived exertion (RPE) scale;Able to check pulse independently  Increase Physical Activity     Comments  Reviewed home exercise with pt today.  Pt plans to walk and do handheld weights for exercise, 2x/week in addition to coming to cardiac rehab.  Reviewed THR, pulse, RPE, sign and symptoms, and when to call 911 or MD.  Also discussed weather considerations and indoor options.  Pt voiced understanding.  -  Pt is responding well to exericse prescription. Pt has progressed to level 4 on NuStep. Will continue to monitor patient's progress.      Expected Outcomes  Pt will be compliant with HEP and improve in aerobic and functional fitness  -  Pt will continue to increase physical activity at home by walking 2-3 days a week.          Discharge Exercise Prescription (Final Exercise Prescription Changes): Exercise Prescription Changes - 01/13/17 1600      Response to Exercise   Blood Pressure (Admit)  118/78    Blood Pressure (Exercise)  126/80    Blood Pressure (Exit)  110/80    Heart Rate (Admit)  73 bpm    Heart Rate (Exercise)  84 bpm    Heart Rate (Exit)  62 bpm    Perceived Dyspnea (Exercise)  14    Duration  Progress to 30 minutes of  aerobic without signs/symptoms of physical distress    Intensity  THRR unchanged      Progression   Progression  Continue to progress workloads to maintain intensity without signs/symptoms of physical distress.    Average METs  2.3      Resistance Training   Training Prescription  No      Interval Training   Interval Training  No      NuStep   Level  4    SPM  70    Minutes  15    METs  2.2      Arm Ergometer   Level  1    Minutes  15    METs  2.49       Home Exercise Plan   Plans to continue exercise at  Home (comment) Walking     Frequency  Add 2 additional days to program exercise sessions.    Initial Home Exercises Provided  12/23/16       Nutrition:  Target Goals: Understanding of nutrition guidelines, daily intake of sodium 1500mg , cholesterol 200mg , calories 30% from fat and 7% or less from saturated fats, daily to have 5 or more servings of fruits and vegetables.  Biometrics: Pre Biometrics - 12/03/16 1608      Pre Biometrics   Waist Circumference  39.5 inches    Hip Circumference  43 inches    Waist to Hip Ratio  0.92 %    Triceps Skinfold  22 mm    % Body Fat  29.3 %    Grip Strength  24 kg    Flexibility  8 in    Single Leg Stand  17 seconds        Nutrition Therapy Plan and Nutrition Goals: Nutrition Therapy & Goals - 12/04/16 1422      Nutrition Therapy   Diet  Therapeutic Lifestyle Changes      Personal Nutrition Goals   Nutrition Goal  Pt to identify food quantities necessary to achieve weight loss of 6-11 lb at graduation from cardiac rehab. Goal wt of 175 lb desired.       Intervention Plan   Intervention  Prescribe, educate and counsel regarding individualized specific dietary modifications aiming towards targeted core components such as weight, hypertension, lipid management, diabetes, heart failure and other comorbidities.    Expected Outcomes  Short Term Goal: Understand basic principles of dietary content, such as calories, fat, sodium, cholesterol and nutrients.;Long Term Goal: Adherence to prescribed nutrition plan.       Nutrition Discharge: Nutrition Scores:   Nutrition Goals Re-Evaluation:   Nutrition Goals Re-Evaluation:   Nutrition Goals Discharge (Final Nutrition Goals Re-Evaluation):   Psychosocial: Target Goals: Acknowledge presence or absence of significant depression and/or stress, maximize coping skills, provide positive support system. Participant is able to verbalize  types and ability to use techniques and skills needed for reducing stress and depression.  Initial Review & Psychosocial Screening: Initial Psych Review & Screening - 12/03/16 1628      Initial Review   Current issues with  Current Depression;Current Anxiety/Panic;Current Stress Concerns rates as low    Source of Stress Concerns  Chronic Illness      Family Dynamics   Good Support System?  Yes Family, Faith and Fraternity - [employer]      Barriers   Psychosocial barriers to participate in program  The patient should benefit from training in stress management and relaxation.      Screening Interventions   Interventions  Encouraged to exercise       Quality of Life Scores: Quality of Life - 01/08/17 1554      Quality of Life Scores   GLOBAL Pre  -- scores reviewed with pt.  pt expresses disappointment in necessary dietary changes to eat heart healthy diet.,  pt encouraged to participate in nutrition education classes to help incorporate pleasing taste with heart healty selections.        PHQ-9: Recent Review Flowsheet Data    Depression screen [facility] 2/9 12/07/2016   Decreased Interest 0   Down, Depressed, Hopeless 0   PHQ - 2 Score 0     Interpretation of Total Score  Total Score Depression Severity:  1-4 = Minimal depression, 5-9 = Mild depression, 10-14 = Moderate depression, 15-19 = Moderately severe depression, 20-27 = Severe depression   Psychosocial Evaluation and Intervention: Psychosocial Evaluation - 12/07/16 1524      Psychosocial Evaluation & Interventions   Interventions  Encouraged to exercise with the program and follow exercise prescription;Relaxation education;Stress management education    Comments  no psychosocial needs identified, no interventions necessary     Expected Outcomes  pt will exhibit positive outlook with good coping skills.     Continue Psychosocial Services   No Follow up required       Psychosocial Re-Evaluation: Psychosocial  Re-Evaluation    [place] Name 12/25/16 1659 01/22/17 1627           Psychosocial Re-Evaluation  Current issues with  None Identified  None Identified      Comments  no psychosocial needs identified, no interventions necessary   no psychosocial needs identified, no interventions necessary       Expected Outcomes  pt will exhibit positive outlook with good coping skills.   pt will exhibit positive outlook with good coping skills.       Interventions  Encouraged to attend Cardiac Rehabilitation for the exercise  Encouraged to attend Cardiac Rehabilitation for the exercise      Continue Psychosocial Services   No Follow up required  No Follow up required         Psychosocial Discharge (Final Psychosocial Re-Evaluation): Psychosocial Re-Evaluation - 01/22/17 1627      Psychosocial Re-Evaluation   Current issues with  None Identified    Comments  no psychosocial needs identified, no interventions necessary     Expected Outcomes  pt will exhibit positive outlook with good coping skills.     Interventions  Encouraged to attend Cardiac Rehabilitation for the exercise    Continue Psychosocial Services   No Follow up required       Vocational Rehabilitation: Provide vocational rehab assistance to qualifying candidates.   Vocational Rehab Evaluation & Intervention: Vocational Rehab - 12/07/16 1524      Initial Vocational Rehab Evaluation & Intervention   Assessment shows need for Vocational Rehabilitation  No       Education: Education Goals: Education classes will be provided on a weekly basis, covering required topics. Participant will state understanding/return demonstration of topics presented.  Learning Barriers/Preferences: Learning Barriers/Preferences - 12/03/16 1351      Learning Barriers/Preferences   Learning Barriers  Sight    Learning Preferences  Written Material;Verbal Instruction;Skilled Demonstration;Pictoral;Computer/Internet;Video       Education Topics: Count  Your Pulse:  -Group instruction provided by verbal instruction, demonstration, patient participation and written materials to support subject.  Instructors address importance of being able to find your pulse and how to count your pulse when at home without a heart monitor.  Patients get hands on experience counting their pulse with staff help and individually.   Heart Attack, Angina, and Risk Factor Modification:  -Group instruction provided by verbal instruction, video, and written materials to support subject.  Instructors address signs and symptoms of angina and heart attacks.    Also discuss risk factors for heart disease and how to make changes to improve heart health risk factors.   Functional Fitness:  -Group instruction provided by verbal instruction, demonstration, patient participation, and written materials to support subject.  Instructors address safety measures for doing things around the house.  Discuss how to get up and down off the floor, how to pick things up properly, how to safely get out of a chair without assistance, and balance training.   Meditation and Mindfulness:  -Group instruction provided by verbal instruction, patient participation, and written materials to support subject.  Instructor addresses importance of mindfulness and meditation practice to help reduce stress and improve awareness.  Instructor also leads participants through a meditation exercise.    Stretching for Flexibility and Mobility:  -Group instruction provided by verbal instruction, patient participation, and written materials to support subject.  Instructors lead participants through series of stretches that are designed to increase flexibility thus improving mobility.  These stretches are additional exercise for major muscle groups that are typically performed during regular warm up and cool down.   Hands Only CPR:  -Group verbal, video, and participation provides  a basic overview of AHA guidelines  for community CPR. Role-play of emergencies allow participants the opportunity to practice calling for help and chest compression technique with discussion of AED use.   Hypertension: -Group verbal and written instruction that provides a basic overview of hypertension including the most recent diagnostic guidelines, risk factor reduction with self-care instructions and medication management.    Nutrition I class: Heart Healthy Eating:  -Group instruction provided by PowerPoint slides, verbal discussion, and written materials to support subject matter. The instructor gives an explanation and review of the Therapeutic Lifestyle Changes diet recommendations, which includes a discussion on lipid goals, dietary fat, sodium, fiber, plant stanol/sterol esters, sugar, and the components of a well-balanced, healthy diet.   CARDIAC REHAB PHASE II EXERCISE from 01/13/2017 in [place]  Date  01/11/17  Educator  RD  Instruction Review Code  Not applicable      Nutrition II class: Lifestyle Skills:  -Group instruction provided by PowerPoint slides, verbal discussion, and written materials to support subject matter. The instructor gives an explanation and review of label reading, grocery shopping for heart health, heart healthy recipe modifications, and ways to make healthier choices when eating out.   CARDIAC REHAB PHASE II EXERCISE from 01/13/2017 in [place]  Date  01/11/17  Educator  RD  Instruction Review Code  Not applicable      Diabetes Question & Answer:  -Group instruction provided by PowerPoint slides, verbal discussion, and written materials to support subject matter. The instructor gives an explanation and review of diabetes co-morbidities, pre- and post-prandial blood glucose goals, pre-exercise blood glucose goals, signs, symptoms, and treatment of hypoglycemia and hyperglycemia, and foot care basics.   Diabetes Blitz:   -Group instruction provided by PowerPoint slides, verbal discussion, and written materials to support subject matter. The instructor gives an explanation and review of the physiology behind type 1 and type 2 diabetes, diabetes medications and rational behind using different medications, pre- and post-prandial blood glucose recommendations and Hemoglobin A1c goals, diabetes diet, and exercise including blood glucose guidelines for exercising safely.    Portion Distortion:  -Group instruction provided by PowerPoint slides, verbal discussion, written materials, and food models to support subject matter. The instructor gives an explanation of serving size versus portion size, changes in portions sizes over the last 20 years, and what consists of a serving from each food group.   CARDIAC REHAB PHASE II EXERCISE from 01/13/2017 in [place]  Date  01/13/17  Educator  RD  Instruction Review Code  2- meets goals/outcomes      Stress Management:  -Group instruction provided by verbal instruction, video, and written materials to support subject matter.  Instructors review role of stress in heart disease and how to cope with stress positively.     Exercising on Your Own:  -Group instruction provided by verbal instruction, power point, and written materials to support subject.  Instructors discuss benefits of exercise, components of exercise, frequency and intensity of exercise, and end points for exercise.  Also discuss use of nitroglycerin and activating EMS.  Review options of places to exercise outside of rehab.  Review guidelines for sex with heart disease.   Cardiac Drugs I:  -Group instruction provided by verbal instruction and written materials to support subject.  Instructor reviews cardiac drug classes: antiplatelets, anticoagulants, beta blockers, and statins.  Instructor discusses reasons, side effects, and lifestyle considerations for each drug class.  CARDIAC REHAB PHASE II EXERCISE from 01/13/2017 in [place] [facility]  Date  12/09/16  Instruction Review Code  2- meets goals/outcomes      Cardiac Drugs II:  -Group instruction provided by verbal instruction and written materials to support subject.  Instructor reviews cardiac drug classes: angiotensin converting enzyme inhibitors (ACE-I), angiotensin II receptor blockers (ARBs), nitrates, and calcium channel blockers.  Instructor discusses reasons, side effects, and lifestyle considerations for each drug class.   Anatomy and Physiology of the Circulatory System:  Group verbal and written instruction and models provide basic cardiac anatomy and physiology, with the coronary electrical and arterial systems. Review of: AMI, Angina, Valve disease, Heart Failure, Peripheral Artery Disease, Cardiac Arrhythmia, Pacemakers, and the ICD.   Other Education:  -Group or individual verbal, written, or video instructions that support the educational goals of the cardiac rehab program.   Knowledge Questionnaire Score: Knowledge Questionnaire Score - 12/03/16 1603      Knowledge Questionnaire Score   Pre Score  20/24       Core Components/Risk Factors/Patient Goals at Admission: Personal Goals and Risk Factors at Admission - 12/03/16 1610      Core Components/Risk Factors/Patient Goals on Admission    Weight Management  Yes;Weight Maintenance;Weight Loss    Intervention  Weight Management: Provide education and appropriate resources to help participant work on and attain dietary goals.;Weight Management: Develop a combined nutrition and exercise program designed to reach desired caloric intake, while maintaining appropriate intake of nutrient and fiber, sodium and fats, and appropriate energy expenditure required for the weight goal.;Weight Management/Obesity: Establish reasonable short term and long term weight goals.    Admit Weight  186 lb 15.2 oz (84.8 kg)    Goal  Weight: Short Term  180 lb (81.6 kg)    Goal Weight: Long Term  175 lb (79.4 kg)    Expected Outcomes  Short Term: Continue to assess and modify interventions until short term weight is achieved;Long Term: Adherence to nutrition and physical activity/exercise program aimed toward attainment of established weight goal;Weight Maintenance: Understanding of the daily nutrition guidelines, which includes 25-35% calories from fat, 7% or less cal from saturated fats, less than 200mg  cholesterol, less than 1.5gm of sodium, & 5 or more servings of fruits and vegetables daily;Weight Loss: Understanding of general recommendations for a balanced deficit meal plan, which promotes 1-2 lb weight loss per week and includes a negative energy balance of [phone] kcal/d;Understanding recommendations for meals to include 15-35% energy as protein, 25-35% energy from fat, 35-60% energy from carbohydrates, less than 200mg  of dietary cholesterol, 20-35 gm of total fiber daily;Understanding of distribution of calorie intake throughout the day with the consumption of 4-5 meals/snacks    Hypertension  Yes    Intervention  Provide education on lifestyle modifcations including regular physical activity/exercise, weight management, moderate sodium restriction and increased consumption of fresh fruit, vegetables, and low fat dairy, alcohol moderation, and smoking cessation.;Monitor prescription use compliance.    Expected Outcomes  Short Term: Continued assessment and intervention until BP is < 140/11mm HG in hypertensive participants. < 130/64mm HG in hypertensive participants with diabetes, heart failure or chronic kidney disease.;Long Term: Maintenance of blood pressure at goal levels.    Lipids  Yes    Intervention  Provide education and support for participant on nutrition & aerobic/resistive exercise along with prescribed medications to achieve LDL 70mg , HDL >40mg .    Expected Outcomes  Short Term: Participant states understanding  of desired cholesterol values  and is compliant with medications prescribed. Participant is following exercise prescription and nutrition guidelines.;Long Term: Cholesterol controlled with medications as prescribed, with individualized exercise RX and with personalized nutrition plan. Value goals: LDL < 70mg , HDL > 40 mg.       Core Components/Risk Factors/Patient Goals Review:  Goals and Risk Factor Review    Row Name 12/25/16 1658 01/22/17 1626           Core Components/Risk Factors/Patient Goals Review   Personal Goals Review  Weight Management/Obesity;Hypertension;Lipids  Weight Management/Obesity;Hypertension;Lipids      Review  pt with multiple CAD RF demonstrates eagerness to participate in CR opportunities.   pt with multiple CAD RF demonstrates eagerness to participate in CR opportunities. pt demonstrates compliance with hypertension and lipid management regimen.       Expected Outcomes  pt will participate in CR exercise, nutrition and lifestyle modification opportunities to decrease overall RF.    pt will participate in CR exercise, nutrition and lifestyle modification opportunities to decrease overall RF.           Core Components/Risk Factors/Patient Goals at Discharge (Final Review):  Goals and Risk Factor Review - 01/22/17 1626      Core Components/Risk Factors/Patient Goals Review   Personal Goals Review  Weight Management/Obesity;Hypertension;Lipids    Review  pt with multiple CAD RF demonstrates eagerness to participate in CR opportunities. pt demonstrates compliance with hypertension and lipid management regimen.     Expected Outcomes  pt will participate in CR exercise, nutrition and lifestyle modification opportunities to decrease overall RF.         ITP Comments: ITP Comments    Row Name 12/03/16 1344 12/07/16 1521 12/25/16 1657 01/22/17 1626     ITP Comments  Dr. Fransico Him, Medical Director  pt participated in first exercise day without difficulty.   30 day  ITP review.  pt with good attendance and participation.   30 day ITP review.  pt with good attendance and participation.        Comments:

## 2017-01-25 ENCOUNTER — Encounter (HOSPITAL_COMMUNITY)
Admission: RE | Admit: 2017-01-25 | Discharge: 2017-01-25 | Disposition: A | Discharge status: Home / Self Care | Payer: Medicare Other | Source: Ambulatory Visit | Attending: Cardiology | Admitting: Cardiology

## 2017-01-25 DIAGNOSIS — Z952 Presence of prosthetic heart valve: Secondary | ICD-10-CM

## 2017-01-27 ENCOUNTER — Encounter (HOSPITAL_COMMUNITY)
Admission: RE | Admit: 2017-01-27 | Discharge: 2017-01-27 | Disposition: A | Discharge status: Home / Self Care | Payer: Medicare Other | Source: Ambulatory Visit | Attending: Cardiology | Admitting: Cardiology

## 2017-01-27 DIAGNOSIS — Z952 Presence of prosthetic heart valve: Secondary | ICD-10-CM | POA: Diagnosis not present

## 2017-01-29 ENCOUNTER — Encounter (HOSPITAL_COMMUNITY)
Admission: RE | Admit: 2017-01-29 | Discharge: 2017-01-29 | Disposition: A | Discharge status: Home / Self Care | Payer: Medicare Other | Source: Ambulatory Visit | Attending: Cardiology | Admitting: Cardiology

## 2017-01-29 DIAGNOSIS — Z952 Presence of prosthetic heart valve: Secondary | ICD-10-CM

## 2017-02-01 ENCOUNTER — Encounter (HOSPITAL_COMMUNITY)
Admission: RE | Admit: 2017-02-01 | Discharge: 2017-02-01 | Disposition: A | Discharge status: Home / Self Care | Payer: Medicare Other | Source: Ambulatory Visit | Attending: Cardiology | Admitting: Cardiology

## 2017-02-01 DIAGNOSIS — Z952 Presence of prosthetic heart valve: Secondary | ICD-10-CM | POA: Diagnosis not present

## 2017-02-03 ENCOUNTER — Encounter (HOSPITAL_COMMUNITY)
Admission: RE | Admit: 2017-02-03 | Discharge: 2017-02-03 | Disposition: A | Discharge status: Home / Self Care | Payer: Medicare Other | Source: Ambulatory Visit | Attending: Cardiology | Admitting: Cardiology

## 2017-02-03 DIAGNOSIS — Z952 Presence of prosthetic heart valve: Secondary | ICD-10-CM | POA: Diagnosis not present

## 2017-02-05 ENCOUNTER — Encounter (HOSPITAL_COMMUNITY)
Admission: RE | Admit: 2017-02-05 | Discharge: 2017-02-05 | Disposition: A | Discharge status: Home / Self Care | Payer: Medicare Other | Source: Ambulatory Visit | Attending: Cardiology | Admitting: Cardiology

## 2017-02-05 DIAGNOSIS — Z952 Presence of prosthetic heart valve: Secondary | ICD-10-CM | POA: Diagnosis not present

## 2017-02-08 ENCOUNTER — Encounter (HOSPITAL_COMMUNITY)
Admission: RE | Admit: 2017-02-08 | Discharge: 2017-02-08 | Disposition: A | Discharge status: Home / Self Care | Payer: Medicare Other | Source: Ambulatory Visit | Attending: Cardiology | Admitting: Cardiology

## 2017-02-08 DIAGNOSIS — Z952 Presence of prosthetic heart valve: Secondary | ICD-10-CM

## 2017-02-10 ENCOUNTER — Encounter (HOSPITAL_COMMUNITY)
Admission: RE | Admit: 2017-02-10 | Discharge: 2017-02-10 | Disposition: A | Discharge status: Home / Self Care | Payer: Medicare Other | Source: Ambulatory Visit | Attending: Cardiology | Admitting: Cardiology

## 2017-02-10 DIAGNOSIS — Z952 Presence of prosthetic heart valve: Secondary | ICD-10-CM | POA: Diagnosis not present

## 2017-02-12 ENCOUNTER — Encounter (HOSPITAL_COMMUNITY)
Admission: RE | Admit: 2017-02-12 | Discharge: 2017-02-12 | Disposition: A | Discharge status: Home / Self Care | Payer: Medicare Other | Source: Ambulatory Visit | Attending: Cardiology | Admitting: Cardiology

## 2017-02-12 DIAGNOSIS — Z952 Presence of prosthetic heart valve: Secondary | ICD-10-CM | POA: Diagnosis not present

## 2017-02-15 ENCOUNTER — Telehealth (HOSPITAL_COMMUNITY): Payer: Self-pay | Admitting: *Deleted

## 2017-02-15 ENCOUNTER — Encounter (HOSPITAL_COMMUNITY): Payer: Medicare Other

## 2017-02-17 ENCOUNTER — Encounter (HOSPITAL_COMMUNITY)
Admission: RE | Admit: 2017-02-17 | Discharge: 2017-02-17 | Disposition: A | Discharge status: Home / Self Care | Payer: Medicare Other | Source: Ambulatory Visit | Attending: Cardiology | Admitting: Cardiology

## 2017-02-17 DIAGNOSIS — Z952 Presence of prosthetic heart valve: Secondary | ICD-10-CM | POA: Diagnosis not present

## 2017-02-17 NOTE — Progress Notes (Signed)
Cardiac Individual Treatment Plan  Patient Details  Name: Lonnie Hansen MRN: [id number] Date of Birth: [birth year] Referring Provider:     CARDIAC REHAB PHASE II ORIENTATION from 12/03/2016 in [place]  Referring Provider  Jordan,Peter MD      Initial Encounter Date:    CARDIAC REHAB PHASE II ORIENTATION from 12/03/2016 in [place]  Date  12/03/16  Referring Provider  Jordan,Peter MD      Visit Diagnosis: S/P AVR (aortic valve replacement)  Patient's Home Medications on Admission:  Current Outpatient Medications:  .  amLODipine (NORVASC) 10 MG tablet, Take 10 mg by mouth daily., Disp: , Rfl:  .  aspirin EC 81 MG EC tablet, Take 1 tablet (81 mg total) by mouth daily., Disp: , Rfl:  .  atenolol (TENORMIN) 25 MG tablet, Take 0.5 tablets (12.5 mg total) by mouth 2 (two) times daily., Disp: 30 tablet, Rfl: 1 .  Ferrous Sulfate (IRON) 325 (65 Fe) MG TABS, Take 1 tablet by mouth daily., Disp: , Rfl:  .  furosemide (LASIX) 20 MG tablet, Take 1 tablet (20 mg total) by mouth daily., Disp: 30 tablet, Rfl: 3 .  latanoprost (XALATAN) 0.005 % ophthalmic solution, Place 1 drop into both eyes at bedtime., Disp: , Rfl:  .  Multiple Vitamin (MULTIVITAMIN WITH MINERALS) TABS tablet, Take 1 tablet by mouth daily., Disp: , Rfl:  .  pantoprazole (PROTONIX) 40 MG tablet, Take 40 mg by mouth daily as needed (indigestion). , Disp: , Rfl:  .  potassium chloride SA (K-DUR,KLOR-CON) 20 MEQ tablet, Take 1 tablet (20 mEq total) daily by mouth., Disp: 90 tablet, Rfl: 3  Past Medical History: Past Medical History:  Diagnosis Date  . Anemia   . Arthritis   . Asymptomatic bilateral carotid artery stenosis 08/2015   1-39%   . Blood dyscrasia    "trouble with my bloods clotting"  . Bruising    on the skin states due to platelets or sometimes low or high  . Cataracts, bilateral   . Coronary artery disease   . Diverticulosis   . Enlarged aorta  ([place])   . Enlarged prostate    slightly  . GERD (gastroesophageal reflux disease)    takes Pantoprazole daily as needed  . Glaucoma    uses eye drops daily  . Headache   . History of colon polyps    benign  . History of kidney stones   . Hyperlipidemia    no on any meds  . Hypertension    takes Amlodipine and Atenolol daily  . Joint pain   . Joint swelling   . Nocturia   . Sleep apnea   . Vocal cord nodule    pt. states  it's a" growth on vocal cord"    Tobacco Use: Social History   Tobacco Use  Smoking Status Former Smoker  . Packs/day: 1.00  . Years: 25.00  . Pack years: 25.00  . Types: Cigarettes  Smokeless Tobacco Never Used    Labs: Recent Review Flowsheet Data    Labs for ITP Cardiac and Pulmonary Rehab Latest Ref Rng & Units 09/17/2016 09/18/2016 09/19/2016 09/20/2016 10/07/2016   Cholestrol 100 - 199 mg/dL - - - - 135   LDLCALC 0 - 99 mg/dL - - - - 86   HDL >39 mg/dL - - - - 34(L)   Trlycerides 0 - 149 mg/dL - - - - 75   Hemoglobin A1c 4.8 - 5.6 % - - - - -  PHART 7.350 - 7.450 - - - - -   PCO2ART 32.0 - 48.0 mmHg - - - - -   HCO3 20.0 - 28.0 mmol/L - - - - -   TCO2 0 - 100 mmol/L - - - - -   ACIDBASEDEF 0.0 - 2.0 mmol/L - - - - -   O2SAT % 55.5 81.3 50.6 59.9 -      Capillary Blood Glucose: Lab Results  Component Value Date   GLUCAP 116 (H) 09/21/2016   GLUCAP 105 (H) 09/21/2016   GLUCAP 133 (H) 09/20/2016   GLUCAP 106 (H) 09/20/2016   GLUCAP 110 (H) 09/20/2016     Exercise Target Goals:    Exercise Program Goal: Individual exercise prescription set using results from initial 6 min walk test and THRR while considering  patient's activity barriers and safety.   Exercise Prescription Goal: Initial exercise prescription builds to 30-45 minutes a day of aerobic activity, 2-3 days per week.  Home exercise guidelines will be given to patient during program as part of exercise prescription that the participant will acknowledge.  Activity Barriers  & Risk Stratification: Activity Barriers & Cardiac Risk Stratification - 12/03/16 1352      Activity Barriers & Cardiac Risk Stratification   Activity Barriers  Muscular Weakness;Deconditioning;Incisional Pain;Assistive Device    Cardiac Risk Stratification  High       6 Minute Walk: 6 Minute Walk    Row Name 12/03/16 1352 12/03/16 1603       6 Minute Walk   Phase  Initial  Initial    Distance  -  1006 feet    Walk Time  -  6 minutes    # of Rest Breaks  -  0    MPH  -  1.91    METS  -  2.48    RPE  -  13    VO2 Peak  -  8.67    Symptoms  -  Yes (comment)    Comments  -  L hip pain 3/10    Resting HR  -  89 bpm    Resting BP  -  122/80    Resting Oxygen Saturation   -  97 %    Exercise Oxygen Saturation  during 6 min walk  -  98 %    Max Ex. HR  -  107 bpm    Max Ex. BP  -  118/72       Oxygen Initial Assessment:   Oxygen Re-Evaluation:   Oxygen Discharge (Final Oxygen Re-Evaluation):   Initial Exercise Prescription: Initial Exercise Prescription - 12/03/16 1600      Date of Initial Exercise RX and Referring Provider   Date  12/03/16    Referring Provider  Jordan,Peter MD      Recumbant Bike   Level  2    Minutes  10    METs  1.5      NuStep   Level  2    SPM  70    Minutes  10    METs  2      Arm Ergometer   Level  1    Minutes  10    METs  1      Prescription Details   Frequency (times per week)  3    Duration  Progress to 30 minutes of continuous aerobic without signs/symptoms of physical distress      Intensity   THRR 40-80% of Max Heartrate  61-122  Ratings of Perceived Exertion  11-13    Perceived Dyspnea  0-4      Progression   Progression  Continue to progress workloads to maintain intensity without signs/symptoms of physical distress.      Resistance Training   Training Prescription  Yes    Weight  2lbs    Reps  10-15       Perform Capillary Blood Glucose checks as needed.  Exercise Prescription Changes: Exercise  Prescription Changes    Row Name 12/09/16 1615 12/24/16 1600 01/01/17 1653 01/13/17 1600 02/01/17 1600     Response to Exercise   Blood Pressure (Admit)  107/70  122/82  120/80  118/78  110/70   Blood Pressure (Exercise)  142/70  118/70  124/60  126/80  120/70   Blood Pressure (Exit)  108/70  114/70  110/74  110/80  106/70   Heart Rate (Admit)  87 bpm  85 bpm  86 bpm  73 bpm  83 bpm   Heart Rate (Exercise)  94 bpm  96 bpm  94 bpm  84 bpm  97 bpm   Heart Rate (Exit)  75 bpm  66 bpm  83 bpm  62 bpm  69 bpm   Perceived Dyspnea (Exercise)  13  15  13  14  13    Duration  Progress to 30 minutes of  aerobic without signs/symptoms of physical distress  Progress to 30 minutes of  aerobic without signs/symptoms of physical distress  Progress to 30 minutes of  aerobic without signs/symptoms of physical distress  Progress to 30 minutes of  aerobic without signs/symptoms of physical distress  Progress to 30 minutes of  aerobic without signs/symptoms of physical distress   Intensity  THRR unchanged  THRR unchanged  THRR unchanged  THRR unchanged  THRR unchanged     Progression   Progression  Continue to progress workloads to maintain intensity without signs/symptoms of physical distress.  Continue to progress workloads to maintain intensity without signs/symptoms of physical distress.  Continue to progress workloads to maintain intensity without signs/symptoms of physical distress.  Continue to progress workloads to maintain intensity without signs/symptoms of physical distress.  Continue to progress workloads to maintain intensity without signs/symptoms of physical distress.   Average METs  2.3  2.5  2.4  2.3  2.3     Resistance Training   Training Prescription  No Relaxation Day   No  Yes  No  Yes   Weight  -  -  2lbs  -  3lbs   Reps  -  -  10-15  -  10-15   Time  -  -  10 Minutes  -  10 Minutes     Interval Training   Interval Training  -  -  No  No  No     Recumbant Bike   Level  1  2.2  -  -  -    Minutes  15  15  -  -  -   METs  2  2.56  -  -  -     NuStep   Level  1  1  4  4  4    SPM  70  70  70  70  70   Minutes  15  15  15  15  15    METs  2.1  1  2.3  2.2  2.3     Arm Ergometer   Level  -  -  1  1  1   Minutes  -  -  15  15  15    METs  -  -  2.51  2.49  2.3     Home Exercise Plan   Plans to continue exercise at  -  Home (comment) Walking   Home (comment) Walking   Home (comment) Walking   Home (comment) Walking    Frequency  -  Add 2 additional days to program exercise sessions.  Add 2 additional days to program exercise sessions.  Add 2 additional days to program exercise sessions.  Add 2 additional days to program exercise sessions.   Initial Home Exercises Provided  -  12/23/16  12/23/16  12/23/16  12/23/16   Row Name 02/17/17 1000             Response to Exercise   Blood Pressure (Admit)  110/70       Blood Pressure (Exercise)  108/60       Blood Pressure (Exit)  120/60       Heart Rate (Admit)  70 bpm       Heart Rate (Exercise)  74 bpm       Heart Rate (Exit)  71 bpm       Perceived Dyspnea (Exercise)  13       Duration  Progress to 30 minutes of  aerobic without signs/symptoms of physical distress       Intensity  THRR unchanged         Progression   Progression  Continue to progress workloads to maintain intensity without signs/symptoms of physical distress.       Average METs  2.2         Resistance Training   Training Prescription  Yes       Weight  2lb       Reps  10-15       Time  10 Minutes         Interval Training   Interval Training  No         Recumbant Bike   Level  2.2       Minutes  15       METs  2         NuStep   Level  3       SPM  70       Minutes  15       METs  2.4         Arm Ergometer   Level  -       Minutes  -       METs  -         Home Exercise Plan   Plans to continue exercise at  Home (comment) Walking        Frequency  Add 2 additional days to program exercise sessions.       Initial Home Exercises  Provided  12/23/16          Exercise Comments: Exercise Comments    Row Name 12/24/16 1606 01/13/17 1658 02/08/17 1627       Exercise Comments  Reviewed METs and goals with pt. Will continue to monitor pt's progress.   Pt is responding well to exercise. Pt will continue to work on increasing his physical activity levels at home by walking.   Reviewed METs and goals. Pt is tolerating light exercise very well; will continue to monitor pt's progress and activity levels.         Exercise  Goals and Review: Exercise Goals    Row Name 12/03/16 1415             Exercise Goals   Increase Physical Activity  Yes       Intervention  Provide advice, education, support and counseling about physical activity/exercise needs.;Develop an individualized exercise prescription for aerobic and resistive training based on initial evaluation findings, risk stratification, comorbidities and participant's personal goals.       Expected Outcomes  Achievement of increased cardiorespiratory fitness and enhanced flexibility, muscular endurance and strength shown through measurements of functional capacity and personal statement of participant.       Increase Strength and Stamina  Yes       Intervention  Provide advice, education, support and counseling about physical activity/exercise needs.;Develop an individualized exercise prescription for aerobic and resistive training based on initial evaluation findings, risk stratification, comorbidities and participant's personal goals.       Expected Outcomes  Achievement of increased cardiorespiratory fitness and enhanced flexibility, muscular endurance and strength shown through measurements of functional capacity and personal statement of participant.       Able to understand and use rate of perceived exertion (RPE) scale  Yes       Intervention  Provide education and explanation on how to use RPE scale       Expected Outcomes  Long Term:  Able to use RPE to guide  intensity level when exercising independently;Short Term: Able to use RPE daily in rehab to express subjective intensity level       Knowledge and understanding of Target Heart Rate Range (THRR)  Yes       Intervention  Provide education and explanation of THRR including how the numbers were predicted and where they are located for reference       Expected Outcomes  Long Term: Able to use THRR to govern intensity when exercising independently;Short Term: Able to use daily as guideline for intensity in rehab;Short Term: Able to state/look up THRR       Able to check pulse independently  Yes       Intervention  Provide education and demonstration on how to check pulse in carotid and radial arteries.;Review the importance of being able to check your own pulse for safety during independent exercise       Expected Outcomes  Short Term: Able to explain why pulse checking is important during independent exercise;Long Term: Able to check pulse independently and accurately       Understanding of Exercise Prescription  Yes       Intervention  Provide education, explanation, and written materials on patient's individual exercise prescription       Expected Outcomes  Short Term: Able to explain program exercise prescription;Long Term: Able to explain home exercise prescription to exercise independently          Exercise Goals Re-Evaluation : Exercise Goals Re-Evaluation    Row Name 12/23/16 1553 12/24/16 1606 01/13/17 1656 02/08/17 1626 02/08/17 1628     Exercise Goal Re-Evaluation   Exercise Goals Review  Increase Physical Activity;Understanding of Exercise Prescription;Increase Strength and Stamina;Knowledge and understanding of Target Heart Rate Range (THRR);Able to understand and use rate of perceived exertion (RPE) scale;Able to check pulse independently  Increase Physical Activity;Understanding of Exercise Prescription;Increase Strength and Stamina;Knowledge and understanding of Target Heart Rate Range  (THRR);Able to understand and use rate of perceived exertion (RPE) scale;Able to check pulse independently  Increase Physical Activity  Increase Physical Activity;Able to understand and  use Dyspnea scale;Understanding of Exercise Prescription  Increase Physical Activity;Understanding of Exercise Prescription;Increase Strength and Stamina;Knowledge and understanding of Target Heart Rate Range (THRR);Able to understand and use rate of perceived exertion (RPE) scale;Able to check pulse independently   Comments  Reviewed home exercise with pt today.  Pt plans to walk and do handheld weights for exercise, 2x/week in addition to coming to cardiac rehab.  Reviewed THR, pulse, RPE, sign and symptoms, and when to call 911 or MD.  Also discussed weather considerations and indoor options.  Pt voiced understanding.  -  Pt is responding well to exericse prescription. Pt has progressed to level 4 on NuStep. Will continue to monitor patient's progress.   -  Pt is active at home and in the community with playing in the band, and doing light exercises at home. Express to patient exercise/activity limitations and how to exercise safely at home. Pt voiced understanding.    Expected Outcomes  Pt will be compliant with HEP and improve in aerobic and functional fitness  -  Pt will continue to increase physical activity at home by walking 2-3 days a week.   -  Pt will continue to improve in cardiorespiratory fitness and functional mobility.       Discharge Exercise Prescription (Final Exercise Prescription Changes): Exercise Prescription Changes - 02/17/17 1000      Response to Exercise   Blood Pressure (Admit)  110/70    Blood Pressure (Exercise)  108/60    Blood Pressure (Exit)  120/60    Heart Rate (Admit)  70 bpm    Heart Rate (Exercise)  74 bpm    Heart Rate (Exit)  71 bpm    Perceived Dyspnea (Exercise)  13    Duration  Progress to 30 minutes of  aerobic without signs/symptoms of physical distress    Intensity   THRR unchanged      Progression   Progression  Continue to progress workloads to maintain intensity without signs/symptoms of physical distress.    Average METs  2.2      Resistance Training   Training Prescription  Yes    Weight  2lb    Reps  10-15    Time  10 Minutes      Interval Training   Interval Training  No      Recumbant Bike   Level  2.2    Minutes  15    METs  2      NuStep   Level  3    SPM  70    Minutes  15    METs  2.4      Arm Ergometer   Level  --    Minutes  --    METs  --      Home Exercise Plan   Plans to continue exercise at  Home (comment) Walking     Frequency  Add 2 additional days to program exercise sessions.    Initial Home Exercises Provided  12/23/16       Nutrition:  Target Goals: Understanding of nutrition guidelines, daily intake of sodium 1500mg , cholesterol 200mg , calories 30% from fat and 7% or less from saturated fats, daily to have 5 or more servings of fruits and vegetables.  Biometrics: Pre Biometrics - 12/03/16 1608      Pre Biometrics   Waist Circumference  39.5 inches    Hip Circumference  43 inches    Waist to Hip Ratio  0.92 %    Triceps Skinfold  22 mm    % Body Fat  29.3 %    Grip Strength  24 kg    Flexibility  8 in    Single Leg Stand  17 seconds        Nutrition Therapy Plan and Nutrition Goals: Nutrition Therapy & Goals - 12/04/16 1422      Nutrition Therapy   Diet  Therapeutic Lifestyle Changes      Personal Nutrition Goals   Nutrition Goal  Pt to identify food quantities necessary to achieve weight loss of 6-11 lb at graduation from cardiac rehab. Goal wt of 175 lb desired.       Intervention Plan   Intervention  Prescribe, educate and counsel regarding individualized specific dietary modifications aiming towards targeted core components such as weight, hypertension, lipid management, diabetes, heart failure and other comorbidities.    Expected Outcomes  Short Term Goal: Understand basic  principles of dietary content, such as calories, fat, sodium, cholesterol and nutrients.;Long Term Goal: Adherence to prescribed nutrition plan.       Nutrition Assessments:   Nutrition Goals Re-Evaluation:   Nutrition Goals Re-Evaluation:   Nutrition Goals Discharge (Final Nutrition Goals Re-Evaluation):   Psychosocial: Target Goals: Acknowledge presence or absence of significant depression and/or stress, maximize coping skills, provide positive support system. Participant is able to verbalize types and ability to use techniques and skills needed for reducing stress and depression.  Initial Review & Psychosocial Screening: Initial Psych Review & Screening - 12/03/16 1628      Initial Review   Current issues with  Current Depression;Current Anxiety/Panic;Current Stress Concerns rates as low    Source of Stress Concerns  Chronic Illness      Family Dynamics   Good Support System?  Yes Family, Faith and Fraternity - [employer]      Barriers   Psychosocial barriers to participate in program  The patient should benefit from training in stress management and relaxation.      Screening Interventions   Interventions  Encouraged to exercise       Quality of Life Scores: Quality of Life - 01/08/17 1554      Quality of Life Scores   GLOBAL Pre  -- scores reviewed with pt.  pt expresses disappointment in necessary dietary changes to eat heart healthy diet.,  pt encouraged to participate in nutrition education classes to help incorporate pleasing taste with heart healty selections.       Scores of 19 and below usually indicate a poorer quality of life in these areas.  A difference of  2-3 points is a clinically meaningful difference.  A difference of 2-3 points in the total score of the Quality of Life Index has been associated with significant improvement in overall quality of life, self-image, physical symptoms, and general health in studies assessing change in quality of  life.  PHQ-9: Recent Review Flowsheet Data    Depression screen [facility] 2/9 12/07/2016   Decreased Interest 0   Down, Depressed, Hopeless 0   PHQ - 2 Score 0     Interpretation of Total Score  Total Score Depression Severity:  1-4 = Minimal depression, 5-9 = Mild depression, 10-14 = Moderate depression, 15-19 = Moderately severe depression, 20-27 = Severe depression   Psychosocial Evaluation and Intervention: Psychosocial Evaluation - 12/07/16 1524      Psychosocial Evaluation & Interventions   Interventions  Encouraged to exercise with the program and follow exercise prescription;Relaxation education;Stress management education    Comments  no  psychosocial needs identified, no interventions necessary     Expected Outcomes  pt will exhibit positive outlook with good coping skills.     Continue Psychosocial Services   No Follow up required       Psychosocial Re-Evaluation: Psychosocial Re-Evaluation    [employer] Name 12/25/16 1659 01/22/17 1627 02/12/17 1643         Psychosocial Re-Evaluation   Current issues with  None Identified  None Identified  None Identified     Comments  no psychosocial needs identified, no interventions necessary   no psychosocial needs identified, no interventions necessary   no psychosocial needs identified, no interventions necessary      Expected Outcomes  pt will exhibit positive outlook with good coping skills.   pt will exhibit positive outlook with good coping skills.   pt will exhibit positive outlook with good coping skills.      Interventions  Encouraged to attend Cardiac Rehabilitation for the exercise  Encouraged to attend Cardiac Rehabilitation for the exercise  Encouraged to attend Cardiac Rehabilitation for the exercise     Continue Psychosocial Services   No Follow up required  No Follow up required  No Follow up required        Psychosocial Discharge (Final Psychosocial Re-Evaluation): Psychosocial Re-Evaluation - 02/12/17 1643       Psychosocial Re-Evaluation   Current issues with  None Identified    Comments  no psychosocial needs identified, no interventions necessary     Expected Outcomes  pt will exhibit positive outlook with good coping skills.     Interventions  Encouraged to attend Cardiac Rehabilitation for the exercise    Continue Psychosocial Services   No Follow up required       Vocational Rehabilitation: Provide vocational rehab assistance to qualifying candidates.   Vocational Rehab Evaluation & Intervention: Vocational Rehab - 12/07/16 1524      Initial Vocational Rehab Evaluation & Intervention   Assessment shows need for Vocational Rehabilitation  No       Education: Education Goals: Education classes will be provided on a weekly basis, covering required topics. Participant will state understanding/return demonstration of topics presented.  Learning Barriers/Preferences: Learning Barriers/Preferences - 12/03/16 1351      Learning Barriers/Preferences   Learning Barriers  Sight    Learning Preferences  Written Material;Verbal Instruction;Skilled Demonstration;Pictoral;Computer/Internet;Video       Education Topics: Count Your Pulse:  -Group instruction provided by verbal instruction, demonstration, patient participation and written materials to support subject.  Instructors address importance of being able to find your pulse and how to count your pulse when at home without a heart monitor.  Patients get hands on experience counting their pulse with staff help and individually.   Heart Attack, Angina, and Risk Factor Modification:  -Group instruction provided by verbal instruction, video, and written materials to support subject.  Instructors address signs and symptoms of angina and heart attacks.    Also discuss risk factors for heart disease and how to make changes to improve heart health risk factors.   CARDIAC REHAB PHASE II EXERCISE from 02/10/2017 in [place]  Date  01/27/17  Instruction Review Code  2- meets goals/outcomes      Functional Fitness:  -Group instruction provided by verbal instruction, demonstration, patient participation, and written materials to support subject.  Instructors address safety measures for doing things around the house.  Discuss how to get up and down off the floor, how to pick things up  properly, how to safely get out of a chair without assistance, and balance training.   Meditation and Mindfulness:  -Group instruction provided by verbal instruction, patient participation, and written materials to support subject.  Instructor addresses importance of mindfulness and meditation practice to help reduce stress and improve awareness.  Instructor also leads participants through a meditation exercise.    Stretching for Flexibility and Mobility:  -Group instruction provided by verbal instruction, patient participation, and written materials to support subject.  Instructors lead participants through series of stretches that are designed to increase flexibility thus improving mobility.  These stretches are additional exercise for major muscle groups that are typically performed during regular warm up and cool down.   Hands Only CPR:  -Group verbal, video, and participation provides a basic overview of AHA guidelines for community CPR. Role-play of emergencies allow participants the opportunity to practice calling for help and chest compression technique with discussion of AED use.   Hypertension: -Group verbal and written instruction that provides a basic overview of hypertension including the most recent diagnostic guidelines, risk factor reduction with self-care instructions and medication management.    Nutrition I class: Heart Healthy Eating:  -Group instruction provided by PowerPoint slides, verbal discussion, and written materials to support subject matter. The instructor gives an explanation and review of the  Therapeutic Lifestyle Changes diet recommendations, which includes a discussion on lipid goals, dietary fat, sodium, fiber, plant stanol/sterol esters, sugar, and the components of a well-balanced, healthy diet.   CARDIAC REHAB PHASE II EXERCISE from 02/10/2017 in [place]  Date  01/11/17  Educator  RD  Instruction Review Code  Not applicable      Nutrition II class: Lifestyle Skills:  -Group instruction provided by PowerPoint slides, verbal discussion, and written materials to support subject matter. The instructor gives an explanation and review of label reading, grocery shopping for heart health, heart healthy recipe modifications, and ways to make healthier choices when eating out.   CARDIAC REHAB PHASE II EXERCISE from 02/10/2017 in [place]  Date  01/11/17  Educator  RD  Instruction Review Code  Not applicable      Diabetes Question & Answer:  -Group instruction provided by PowerPoint slides, verbal discussion, and written materials to support subject matter. The instructor gives an explanation and review of diabetes co-morbidities, pre- and post-prandial blood glucose goals, pre-exercise blood glucose goals, signs, symptoms, and treatment of hypoglycemia and hyperglycemia, and foot care basics.   Diabetes Blitz:  -Group instruction provided by PowerPoint slides, verbal discussion, and written materials to support subject matter. The instructor gives an explanation and review of the physiology behind type 1 and type 2 diabetes, diabetes medications and rational behind using different medications, pre- and post-prandial blood glucose recommendations and Hemoglobin A1c goals, diabetes diet, and exercise including blood glucose guidelines for exercising safely.    Portion Distortion:  -Group instruction provided by PowerPoint slides, verbal discussion, written materials, and food models to support subject matter. The  instructor gives an explanation of serving size versus portion size, changes in portions sizes over the last 20 years, and what consists of a serving from each food group.   CARDIAC REHAB PHASE II EXERCISE from 02/10/2017 in [place]  Date  01/13/17  Educator  RD  Instruction Review Code  2- meets goals/outcomes      Stress Management:  -Group instruction provided by verbal instruction, video, and written materials to support  subject matter.  Instructors review role of stress in heart disease and how to cope with stress positively.     Exercising on Your Own:  -Group instruction provided by verbal instruction, power point, and written materials to support subject.  Instructors discuss benefits of exercise, components of exercise, frequency and intensity of exercise, and end points for exercise.  Also discuss use of nitroglycerin and activating EMS.  Review options of places to exercise outside of rehab.  Review guidelines for sex with heart disease.   CARDIAC REHAB PHASE II EXERCISE from 02/10/2017 in [place]  Date  02/10/17  Educator  EP  Instruction Review Code  2- meets goals/outcomes      Cardiac Drugs I:  -Group instruction provided by verbal instruction and written materials to support subject.  Instructor reviews cardiac drug classes: antiplatelets, anticoagulants, beta blockers, and statins.  Instructor discusses reasons, side effects, and lifestyle considerations for each drug class.   CARDIAC REHAB PHASE II EXERCISE from 02/10/2017 in [place]  Date  12/09/16  Instruction Review Code  2- meets goals/outcomes      Cardiac Drugs II:  -Group instruction provided by verbal instruction and written materials to support subject.  Instructor reviews cardiac drug classes: angiotensin converting enzyme inhibitors (ACE-I), angiotensin II receptor blockers (ARBs), nitrates, and calcium  channel blockers.  Instructor discusses reasons, side effects, and lifestyle considerations for each drug class.   Anatomy and Physiology of the Circulatory System:  Group verbal and written instruction and models provide basic cardiac anatomy and physiology, with the coronary electrical and arterial systems. Review of: AMI, Angina, Valve disease, Heart Failure, Peripheral Artery Disease, Cardiac Arrhythmia, Pacemakers, and the ICD.   Other Education:  -Group or individual verbal, written, or video instructions that support the educational goals of the cardiac rehab program.   Knowledge Questionnaire Score: Knowledge Questionnaire Score - 12/03/16 1603      Knowledge Questionnaire Score   Pre Score  20/24       Core Components/Risk Factors/Patient Goals at Admission: Personal Goals and Risk Factors at Admission - 12/03/16 1610      Core Components/Risk Factors/Patient Goals on Admission    Weight Management  Yes;Weight Maintenance;Weight Loss    Intervention  Weight Management: Provide education and appropriate resources to help participant work on and attain dietary goals.;Weight Management: Develop a combined nutrition and exercise program designed to reach desired caloric intake, while maintaining appropriate intake of nutrient and fiber, sodium and fats, and appropriate energy expenditure required for the weight goal.;Weight Management/Obesity: Establish reasonable short term and long term weight goals.    Admit Weight  186 lb 15.2 oz (84.8 kg)    Goal Weight: Short Term  180 lb (81.6 kg)    Goal Weight: Long Term  175 lb (79.4 kg)    Expected Outcomes  Short Term: Continue to assess and modify interventions until short term weight is achieved;Long Term: Adherence to nutrition and physical activity/exercise program aimed toward attainment of established weight goal;Weight Maintenance: Understanding of the daily nutrition guidelines, which includes 25-35% calories from fat, 7% or less  cal from saturated fats, less than 200mg  cholesterol, less than 1.5gm of sodium, & 5 or more servings of fruits and vegetables daily;Weight Loss: Understanding of general recommendations for a balanced deficit meal plan, which promotes 1-2 lb weight loss per week and includes a negative energy balance of [phone] kcal/d;Understanding recommendations for meals to include 15-35% energy as protein, 25-35%  energy from fat, 35-60% energy from carbohydrates, less than 200mg  of dietary cholesterol, 20-35 gm of total fiber daily;Understanding of distribution of calorie intake throughout the day with the consumption of 4-5 meals/snacks    Hypertension  Yes    Intervention  Provide education on lifestyle modifcations including regular physical activity/exercise, weight management, moderate sodium restriction and increased consumption of fresh fruit, vegetables, and low fat dairy, alcohol moderation, and smoking cessation.;Monitor prescription use compliance.    Expected Outcomes  Short Term: Continued assessment and intervention until BP is < 140/62mm HG in hypertensive participants. < 130/42mm HG in hypertensive participants with diabetes, heart failure or chronic kidney disease.;Long Term: Maintenance of blood pressure at goal levels.    Lipids  Yes    Intervention  Provide education and support for participant on nutrition & aerobic/resistive exercise along with prescribed medications to achieve LDL 70mg , HDL >40mg .    Expected Outcomes  Short Term: Participant states understanding of desired cholesterol values and is compliant with medications prescribed. Participant is following exercise prescription and nutrition guidelines.;Long Term: Cholesterol controlled with medications as prescribed, with individualized exercise RX and with personalized nutrition plan. Value goals: LDL < 70mg , HDL > 40 mg.       Core Components/Risk Factors/Patient Goals Review:  Goals and Risk Factor Review    Row Name 12/25/16 1658  01/22/17 1626 02/12/17 1643         Core Components/Risk Factors/Patient Goals Review   Personal Goals Review  Weight Management/Obesity;Hypertension;Lipids  Weight Management/Obesity;Hypertension;Lipids  Weight Management/Obesity;Hypertension;Lipids     Review  pt with multiple CAD RF demonstrates eagerness to participate in CR opportunities.   pt with multiple CAD RF demonstrates eagerness to participate in CR opportunities. pt demonstrates compliance with hypertension and lipid management regimen.   pt with multiple CAD RF demonstrates eagerness to participate in CR opportunities. pt demonstrates compliance with hypertension and lipid management regimen. pt notes increased energy, muscle mass and vocal cord strength.       Expected Outcomes  pt will participate in CR exercise, nutrition and lifestyle modification opportunities to decrease overall RF.    pt will participate in CR exercise, nutrition and lifestyle modification opportunities to decrease overall RF.    pt will participate in CR exercise, nutrition and lifestyle modification opportunities to decrease overall RF.          Core Components/Risk Factors/Patient Goals at Discharge (Final Review):  Goals and Risk Factor Review - 02/12/17 1643      Core Components/Risk Factors/Patient Goals Review   Personal Goals Review  Weight Management/Obesity;Hypertension;Lipids    Review  pt with multiple CAD RF demonstrates eagerness to participate in CR opportunities. pt demonstrates compliance with hypertension and lipid management regimen. pt notes increased energy, muscle mass and vocal cord strength.      Expected Outcomes  pt will participate in CR exercise, nutrition and lifestyle modification opportunities to decrease overall RF.         ITP Comments: ITP Comments    Row Name 12/03/16 1344 12/07/16 1521 12/25/16 1657 01/22/17 1626 02/12/17 1643   ITP Comments  Dr. Fransico Him, Medical Director  pt participated in first exercise day  without difficulty.   30 day ITP review.  pt with good attendance and participation.   30 day ITP review.  pt with good attendance and participation.   30 day ITP review.  pt with good attendance and participation.       Comments:

## 2017-02-19 ENCOUNTER — Encounter (HOSPITAL_COMMUNITY)
Admission: RE | Admit: 2017-02-19 | Discharge: 2017-02-19 | Disposition: A | Discharge status: Home / Self Care | Payer: Medicare Other | Source: Ambulatory Visit | Attending: Cardiology | Admitting: Cardiology

## 2017-02-19 DIAGNOSIS — Z952 Presence of prosthetic heart valve: Secondary | ICD-10-CM

## 2017-02-22 ENCOUNTER — Encounter (HOSPITAL_COMMUNITY)
Admission: RE | Admit: 2017-02-22 | Discharge: 2017-02-22 | Disposition: A | Discharge status: Home / Self Care | Payer: Medicare Other | Source: Ambulatory Visit | Attending: Cardiology | Admitting: Cardiology

## 2017-02-22 DIAGNOSIS — Z952 Presence of prosthetic heart valve: Secondary | ICD-10-CM | POA: Diagnosis not present

## 2017-02-24 ENCOUNTER — Other Ambulatory Visit: Payer: Self-pay | Admitting: Cardiothoracic Surgery

## 2017-02-24 ENCOUNTER — Encounter (HOSPITAL_COMMUNITY)
Admission: RE | Admit: 2017-02-24 | Discharge: 2017-02-24 | Disposition: A | Discharge status: Home / Self Care | Payer: Medicare Other | Source: Ambulatory Visit | Attending: Cardiology | Admitting: Cardiology

## 2017-02-24 DIAGNOSIS — I712 Thoracic aortic aneurysm, without rupture, unspecified: Secondary | ICD-10-CM

## 2017-02-24 DIAGNOSIS — Z952 Presence of prosthetic heart valve: Secondary | ICD-10-CM | POA: Diagnosis not present

## 2017-02-25 ENCOUNTER — Encounter: Payer: Self-pay | Admitting: Cardiothoracic Surgery

## 2017-02-25 ENCOUNTER — Other Ambulatory Visit: Payer: Self-pay

## 2017-02-25 ENCOUNTER — Ambulatory Visit (INDEPENDENT_AMBULATORY_CARE_PROVIDER_SITE_OTHER): Payer: Medicare Other | Admitting: Cardiothoracic Surgery

## 2017-02-25 ENCOUNTER — Ambulatory Visit
Admission: RE | Admit: 2017-02-25 | Discharge: 2017-02-25 | Disposition: A | Discharge status: Home / Self Care | Payer: Medicare Other | Source: Ambulatory Visit | Attending: Cardiothoracic Surgery | Admitting: Cardiothoracic Surgery

## 2017-02-25 VITALS — BP 129/77 | HR 58 | Resp 18 | Ht 67.5 in | Wt 195.6 lb

## 2017-02-25 DIAGNOSIS — Z952 Presence of prosthetic heart valve: Secondary | ICD-10-CM | POA: Diagnosis not present

## 2017-02-25 DIAGNOSIS — I712 Thoracic aortic aneurysm, without rupture, unspecified: Secondary | ICD-10-CM

## 2017-02-25 DIAGNOSIS — J90 Pleural effusion, not elsewhere classified: Secondary | ICD-10-CM

## 2017-02-25 NOTE — Progress Notes (Signed)
[address]       [place], [address]             [phone]      Joziyah C Templin Ama Medical Record #[id number] Date of Birth: 02/02/1950  Referring: Lorretta Harp, MD Primary Care: [name], MD Primary Cardiology: No primary care provider on file.   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 PROCEDURES:  Biologic Bentall with replacement of aortic valve with [employer] pericardial tissue valve, model 3300TFX 29 mm, serial #[id number], and replacement of the aortic root and ascending aorta with a 32 mm Gelweave Valsalva graft and reimplantation of the right and left coronary buttons. SURGEON: Grace Isaac MD  History of Present Illness:      Patient returns to the office today for follow-up check and follow-up x-ray after his aortic root replacement.  Postoperatively he had trouble with recurrent right pleural effusion, he is now involved in cardiac rehab program and doing well.  He is limited by his joint disease.      Past Medical History:  Diagnosis Date  . Anemia   . Arthritis   . Asymptomatic bilateral carotid artery stenosis 08/2015   1-39%   . Blood dyscrasia    "trouble with my bloods clotting"  . Bruising    on the skin states due to platelets or sometimes low or high  . Cataracts, bilateral   . Coronary artery disease   . Diverticulosis   . Enlarged aorta ([place])   . Enlarged prostate    slightly  . GERD (gastroesophageal reflux disease)    takes Pantoprazole daily as needed  . Glaucoma    uses eye drops daily  . Headache   . History of colon polyps    benign  . History of kidney stones   . Hyperlipidemia    no on any meds  . Hypertension    takes Amlodipine and Atenolol daily  . Joint pain   .  Joint swelling   . Nocturia   . Sleep apnea   . Vocal cord nodule    pt. states  it's a" growth on vocal cord"     Social History   Tobacco Use  Smoking Status Former Smoker  . Packs/day: 1.00  . Years: 25.00  . Pack years: 25.00  . Types: Cigarettes  Smokeless Tobacco Never Used    Social History   Substance and Sexual Activity  Alcohol Use No  . Alcohol/week: 0.0 oz     No Known Allergies  Current Outpatient Medications  Medication Sig Dispense Refill  . amLODipine (NORVASC) 10 MG tablet Take 10 mg by mouth daily.    Marland Kitchen aspirin EC 81 MG EC tablet Take 1 tablet (81 mg total) by mouth daily.    Marland Kitchen atenolol (TENORMIN) 25 MG tablet Take 0.5 tablets (12.5 mg total) by mouth 2 (two) times daily. 30 tablet 1  . Ferrous Sulfate (IRON) 325 (65 Fe) MG TABS Take 1 tablet by mouth daily.    Marland Kitchen latanoprost (XALATAN) 0.005 % ophthalmic solution Place 1 drop into both eyes at bedtime.    . Multiple Vitamin (MULTIVITAMIN WITH MINERALS) TABS tablet Take 1 tablet by mouth daily.    . potassium chloride SA (K-DUR,KLOR-CON) 20 MEQ tablet Take 1 tablet (20 mEq total)  daily by mouth. 90 tablet 3  . furosemide (LASIX) 20 MG tablet Take 1 tablet (20 mg total) by mouth daily. 30 tablet 3   No current facility-administered medications for this visit.        Physical Exam: BP 129/77 (BP Location: Left Arm, Patient Position: Sitting, Cuff Size: Large)   Pulse (!) 58   Resp 18   Ht 5' 7.5" (1.715 m)   Wt 195 lb 9.6 oz (88.7 kg)   SpO2 98% Comment: RA  BMI 30.18 kg/m   General appearance: alert, cooperative and appears older than stated age Head: Normocephalic, without obvious abnormality, atraumatic Neck: no adenopathy, no carotid bruit, no JVD, supple, symmetrical, trachea midline and thyroid not enlarged, symmetric, no tenderness/mass/nodules Lymph nodes: Cervical, supraclavicular, and axillary nodes normal. Resp: clear to auscultation bilaterally Back: symmetric, no curvature. ROM  normal. No CVA tenderness. Cardio: regular rate and rhythm, S1, S2 normal, no murmur, click, rub or gallop GI: soft, non-tender; bowel sounds normal; no masses,  no organomegaly Extremities: extremities normal, atraumatic, no cyanosis or edema and Homans sign is negative, no sign of DVT Neurologic: Grossly normal    Diagnostic Studies & Laboratory data:     Recent Radiology Findings:   Dg Chest 2 View  Result Date: 02/25/2017 CLINICAL DATA:  Patient status post aortic valve replacement 09/15/2016. Subsequent encounter. EXAM: CHEST  2 VIEW COMPARISON:  PA and lateral chest 11/19/2016 and 11/04/2016. FINDINGS: Seven intact median sternotomy wires are unchanged. Prosthetic aortic valve is again seen. Heart size is normal. The aorta is somewhat tortuous. Trace right pleural effusion has decreased since the most recent examination. No left effusion. No pneumothorax. Lungs are clear. IMPRESSION: No new abnormality. Trace right pleural effusion has decreased since the most recent examination. Electronically Signed   By: Inge Rise M.D.   On: 02/25/2017 13:01    I have independently reviewed the above radiology studies  and reviewed the findings with the patient.    Recent Lab Findings: Lab Results  Component Value Date   WBC 6.9 11/04/2016   HGB 10.8 (L) 11/04/2016   HCT 35.8 (L) 11/04/2016   PLT 120 (L) 11/04/2016   GLUCOSE 136 (H) 11/04/2016   CHOL 135 10/07/2016   TRIG 75 10/07/2016   HDL 34 (L) 10/07/2016   LDLCALC 86 10/07/2016   ALT 32 10/29/2016   AST 30 10/29/2016   NA 140 11/04/2016   K 3.9 11/04/2016   CL 100 (L) 11/04/2016   CREATININE 0.87 11/04/2016   BUN 11 11/04/2016   CO2 27 11/04/2016   TSH 1.611 04/28/2010   INR 1.15 10/30/2016   HGBA1C 5.3 09/11/2016   Result status: Final result                           *[facility]*                        [address], [place] [address]                             [phone]  ------------------------------------------------------------------- Transthoracic Echocardiography  Patient:    Torrey, Ballinas MR #:       [id number] Study Date: 10/09/2016 Gender:  M Age:        67 Height:     175.3 cm Weight:     90.1 kg BSA:        2.12 m^2 Pt. Status: Room:   SONOGRAPHER  Diamond Nickel  PERFORMING   [place], [facility]  Joanna Hews [id number]  [place] [phone]  [place], MD  cc:  ------------------------------------------------------------------- LV EF: 60% -   65%  ------------------------------------------------------------------- Indications:      I31.3 Pericardial effusion.  ------------------------------------------------------------------- History:   PMH:  Aortic valve replacement. Aortic root dilatation. Sleep apnea.  Syncope.  Coronary artery disease.  Risk factors: Hypertension. Dyslipidemia.  ------------------------------------------------------------------- Study Conclusions  - Left ventricle: The cavity size was normal. Wall thickness was   increased in a pattern of mild LVH. Systolic function was normal.   The estimated ejection fraction was in the range of 60% to 65%. - Aortic valve: AV prosthesis appears to open well Peak and mean   gradients through the valve are 9 and 5 mm Hg respectively. - Mitral valve: Calcified annulus. Mildly thickened leaflets .   There was mild regurgitation. - Pulmonary arteries: PA peak pressure: 37 mm Hg (S). - Pericardium, extracardiac: A trivial pericardial effusion was   identified.  ------------------------------------------------------------------- Study data:  Comparison was made to the study of 09/16/2016.  Study status:  Routine.  Procedure:  Transthoracic echocardiography. Image quality was adequate.  Study completion:  There were no complications.          Transthoracic echocardiography.  M-mode, complete 2D, spectral  Doppler, and color Doppler.  Birthdate: Patient birthdate: 06/05/1950.  Age:  Patient is 67 yr old.  Sex: Gender: male.    BMI: 29.3 kg/m^2.  Blood pressure:     114/81 Patient status:  Outpatient.  Study date:  Study date: 10/09/2016. Study time: 01:03 PM.  Location:  [facility]  -------------------------------------------------------------------  ------------------------------------------------------------------- Left ventricle:  Difficult acoustic windows LVEF is normal at approximately 60 % with mossible mild distal inferior hypokeinesis. The cavity size was normal. Wall thickness was increased in a pattern of mild LVH. Systolic function was normal. The estimated ejection fraction was in the range of 60% to 65%.  ------------------------------------------------------------------- Aortic valve:  AV prosthesis appears to open well Peak and mean gradients through the valve are 9 and 5 mm Hg respectively.  Mildly thickened leaflets.  Doppler:  There was no regurgitation.    VTI ratio of LVOT to aortic valve: 0.47. Peak velocity ratio of LVOT to aortic valve: 0.47. Mean velocity ratio of LVOT to aortic valve: 0.46.    Mean gradient (S): 5 mm Hg. Peak gradient (S): 9 mm Hg.   ------------------------------------------------------------------- Aorta:  Aortic root dilated at 43 mm Ascending aorta is dilated at 45 mm.  ------------------------------------------------------------------- Mitral valve:   Calcified annulus. Mildly thickened leaflets . Leaflet separation was normal.  Doppler:  Transvalvular velocity was within the normal range. There was no evidence for stenosis. There was mild regurgitation.    Peak gradient (D): 2 mm Hg.  ------------------------------------------------------------------- Left atrium:  The atrium was normal in size.  ------------------------------------------------------------------- Right ventricle:  The cavity size was normal. Wall  thickness was normal. Systolic function was normal.  ------------------------------------------------------------------- Pulmonic valve:    Structurally normal valve.   Cusp separation was normal.  Doppler:  Transvalvular velocity was within the normal range. There was no regurgitation.  ------------------------------------------------------------------- Tricuspid valve:   Structurally normal  valve.   Leaflet separation was normal.  Doppler:  Transvalvular velocity was within the normal range. There was trivial regurgitation.  ------------------------------------------------------------------- Right atrium:  The atrium was normal in size.  ------------------------------------------------------------------- Pericardium:  A trivial pericardial effusion was identified.  ------------------------------------------------------------------- Systemic veins: Inferior vena cava: The vessel was normal in size. The respirophasic diameter changes were in the normal range (= 50%), consistent with normal central venous pressure.  ------------------------------------------------------------------- Post procedure conclusions Ascending Aorta:  - Aortic root dilated at 43 mm Ascending aorta is dilated at 45 mm.  ------------------------------------------------------------------- Measurements   Left ventricle                         Value        Reference  LV ID, ED, PLAX chordal                46.1  mm     43 - 52  LV ID, ES, PLAX chordal                37.2  mm     23 - 38  LV fx shortening, PLAX chordal (L)     19    %      >=29  LV PW thickness, ED                    14.7  mm     ---------  IVS/LV PW ratio, ED                    0.86         <=1.3  LV e&', lateral                         9.87  cm/s   ---------  LV E/e&', lateral                       7.95         ---------  LV e&', medial                          10.6  cm/s   ---------  LV E/e&', medial                        7.41          ---------  LV e&', average                         10.24 cm/s   ---------  LV E/e&', average                       7.67         ---------    Ventricular septum                     Value        Reference  IVS thickness, ED                      12.7  mm     ---------    LVOT  Value        Reference  LVOT peak velocity, S                  70.6  cm/s   ---------  LVOT mean velocity, S                  49.9  cm/s   ---------  LVOT VTI, S                            13.5  cm     ---------  LVOT peak gradient, S                  2     mm Hg  ---------    Aortic valve                           Value        Reference  Aortic valve peak velocity, S          150   cm/s   ---------  Aortic valve mean velocity, S          108   cm/s   ---------  Aortic valve VTI, S                    29    cm     ---------  Aortic mean gradient, S                5     mm Hg  ---------  Aortic peak gradient, S                9     mm Hg  ---------  VTI ratio, LVOT/AV                     0.47         ---------  Velocity ratio, peak, LVOT/AV          0.47         ---------  Velocity ratio, mean, LVOT/AV          0.46         ---------    Left atrium                            Value        Reference  LA ID, A-P, ES                         33    mm     ---------  LA ID/bsa, A-P                         1.56  cm/m^2 <=2.2  LA volume, ES, 1-p A4C                 38    ml     ---------  LA volume/bsa, ES, 1-p A4C             18    ml/m^2 ---------    Mitral valve                           Value  Reference  Mitral E-wave peak velocity            78.5  cm/s   ---------  Mitral A-wave peak velocity            51.8  cm/s   ---------  Mitral deceleration time       (L)     130   ms     150 - 230  Mitral peak gradient, D                2     mm Hg  ---------  Mitral E/A ratio, peak                 1.5          ---------    Pulmonary arteries                     Value         Reference  PA pressure, S, DP             (H)     37    mm Hg  <=30    Tricuspid valve                        Value        Reference  Tricuspid regurg peak velocity         270   cm/s   ---------  Tricuspid peak RV-RA gradient          29    mm Hg  ---------    Systemic veins                         Value        Reference  Estimated CVP                          8     mm Hg  ---------    Right ventricle                        Value        Reference  RV pressure, S, DP             (H)     37    mm Hg  <=30  Legend: (L)  and  (H)  mark values outside specified reference range.  ------------------------------------------------------------------- Prepared and Electronically Authenticated by  Dorris Carnes, M.D. 2018-09-16T20:23:22      Assessment / Plan:      Patient status post Bentall with biologic valve, stable postoperatively without evidence of failure.  Plan see the patient back in April with CTA of the chest in follow-up after his a sending aortic replacement/root    Grace Isaac MD      [place] [place] [address] Office [phone]   Beeper [phone]  02/25/2017 1:54 PM

## 2017-02-26 ENCOUNTER — Encounter (HOSPITAL_COMMUNITY)
Admission: RE | Admit: 2017-02-26 | Discharge: 2017-02-26 | Disposition: A | Discharge status: Home / Self Care | Payer: Medicare Other | Source: Ambulatory Visit | Attending: Cardiology | Admitting: Cardiology

## 2017-02-26 DIAGNOSIS — Z952 Presence of prosthetic heart valve: Secondary | ICD-10-CM

## 2017-03-01 ENCOUNTER — Encounter (HOSPITAL_COMMUNITY)
Admission: RE | Admit: 2017-03-01 | Discharge: 2017-03-01 | Disposition: A | Discharge status: Home / Self Care | Payer: Medicare Other | Source: Ambulatory Visit | Attending: Cardiology | Admitting: Cardiology

## 2017-03-01 VITALS — Ht 67.5 in | Wt 194.9 lb

## 2017-03-01 DIAGNOSIS — Z952 Presence of prosthetic heart valve: Secondary | ICD-10-CM | POA: Diagnosis not present

## 2017-03-02 ENCOUNTER — Other Ambulatory Visit: Payer: Self-pay | Admitting: Physician Assistant

## 2017-03-02 NOTE — Telephone Encounter (Signed)
New message ° °Pt verbalized that he is calling for the RN °

## 2017-03-02 NOTE — Telephone Encounter (Signed)
Please review for refill, Thanks !  

## 2017-03-03 ENCOUNTER — Encounter: Payer: Self-pay | Admitting: Nurse Practitioner

## 2017-03-03 ENCOUNTER — Inpatient Hospital Stay (HOSPITAL_COMMUNITY)
Admission: RE | Admit: 2017-03-03 | Discharge: 2017-03-03 | Disposition: A | Discharge status: Home / Self Care | Payer: Self-pay | Source: Ambulatory Visit

## 2017-03-03 DIAGNOSIS — Z952 Presence of prosthetic heart valve: Secondary | ICD-10-CM

## 2017-03-03 NOTE — Progress Notes (Signed)
[facility]  PATIENT: Lonnie Hansen DOB: [birth year]   REASON FOR VISIT: Follow-up for initial CPAP compliance HISTORY FROM: Patient    HISTORY OF PRESENT ILLNESS:UPDATE 2/7/2019CM Lonnie Hansen, 67 year old male returns for follow-up with recent diagnosis of obstructive sleep apnea here for CPAP compliance .  He reports that he has trouble with his mask fitting turns over in bed at night and he hears an air leak.  Compliance data dated 02/02/2017 to 03/03/2017 shows compliance greater than 4 hours at 47%.  Average usage 4 hours 26 minutes pressure set 6-14 cm.  AHI 3.5 significant leak.  He says the mask keeps him from sleeping well but he has not reported this to the equipment company.  He only feels like he gets 45 minutes at a time that he is awake for a while.  He returns for reevaluation   HISTORY: Lonnie Hansen This 67 year old Afro-American male Patient presented for OSA testing in 2011, received the diagnosis of OSA, but was not able to initiate CPAP due to insurance issues at the time. He is now re-referred by Dr. Philip Aspen. Pericardial effusion, CAD, open heart surgery, claustrophobia- informed me that he would not want a FFM .  The patient endorsed the Epworth Sleepiness Scale at 3/24 points, FSS at 59 points   The patient's weight 187 pounds with a height of 68 (inches), resulting in a BMI of 28.4 kg/m2.The patient's neck circumference measured 16 inches. POLYSOMNOGRAPHY IMPRESSION:   1. Severe Obstructive Sleep Apnea(OSA) and Snoring- AHI was 31.9/hr. , all sleep in NREM and supine position. 2. Sleep apnea responded to CPAP at pressures of 7 and 8 cm water, but achieved REM sleep rebound at CPAP of 14 cm with nadir of 95%.    RECOMMENDATIONS: auto-titration CPAP with a pressure window between 6 and 14 cm water with 3 cm EPR.  1. A Fisher and Paykel FFM SIMPLUS in Medium size was used with heated humidity during this study.  I advised that the patient should be tried on a  non FFM - explicitly because of Retrognathia and claustrophobia. I will ask the DME to change to a Wisp or Eson mask to be fitted. Advise to add heated humidity.  Adjust interface and heated humidity as needed.     2. Compliance to PAP therapy should be emphasized as 4 hours minimum use time per night.  Compliance, AHI and air leak information to be downloaded for objective assessment at 30 days, 180 days and annually thereafter.   3. Further information regarding OSA may be obtained from [employer] (www.sleepfoundation.org) or American Sleep Apnea Association (www.sleepapnea.org). 4. Consider dedicated sleep psychology referral if insomnia is of clinical concern.   5. A follow up appointment will be scheduled in the Sleep Clinic at [facility].        REVIEW OF SYSTEMS: Full 14 system review of systems performed and notable only for those listed, all others are neg:  Constitutional: neg  Cardiovascular: neg Ear/Nose/Throat: neg  Skin: neg Eyes: neg Respiratory: neg Gastroitestinal: neg  Hematology/Lymphatic: neg  Endocrine: neg Musculoskeletal:neg Allergy/Immunology: neg Neurological: neg Psychiatric: neg Sleep : Obstructive sleep apnea with   ALLERGIES: No Known Allergies  HOME MEDICATIONS: Outpatient Medications Prior to Visit  Medication Sig Dispense Refill  . amLODipine (NORVASC) 10 MG tablet Take 10 mg by mouth daily.    Marland Kitchen aspirin EC 81 MG EC tablet Take 1 tablet (81 mg total) by mouth daily.    Marland Kitchen atenolol (  TENORMIN) 25 MG tablet Take 0.5 tablets (12.5 mg total) by mouth 2 (two) times daily. 30 tablet 1  . Ferrous Sulfate (IRON) 325 (65 Fe) MG TABS Take 1 tablet by mouth daily.    . furosemide (LASIX) 20 MG tablet TAKE 1 TABLET BY MOUTH ONCE DAILY 90 tablet 3  . latanoprost (XALATAN) 0.005 % ophthalmic solution Place 1 drop into both eyes at bedtime.    . Multiple Vitamin (MULTIVITAMIN WITH MINERALS) TABS tablet Take 1 tablet by mouth  daily.    . potassium chloride SA (K-DUR,KLOR-CON) 20 MEQ tablet Take 1 tablet (20 mEq total) daily by mouth. 90 tablet 3   No facility-administered medications prior to visit.     PAST MEDICAL HISTORY: Past Medical History:  Diagnosis Date  . Anemia   . Arthritis   . Asymptomatic bilateral carotid artery stenosis 08/2015   1-39%   . Blood dyscrasia    "trouble with my bloods clotting"  . Bruising    on the skin states due to platelets or sometimes low or high  . Cataracts, bilateral   . Coronary artery disease   . Diverticulosis   . Enlarged aorta ([place])   . Enlarged prostate    slightly  . GERD (gastroesophageal reflux disease)    takes Pantoprazole daily as needed  . Glaucoma    uses eye drops daily  . Headache   . History of colon polyps    benign  . History of kidney stones   . Hyperlipidemia    no on any meds  . Hypertension    takes Amlodipine and Atenolol daily  . Joint pain   . Joint swelling   . Nocturia   . Sleep apnea   . Vocal cord nodule    pt. states  it's a" growth on vocal cord"    PAST SURGICAL HISTORY: Past Surgical History:  Procedure Laterality Date  . AORTIC ARCH ANGIOGRAPHY N/A 04/16/2016   Procedure: Aortic Arch Angiography;  Surgeon: [name] Martinique, MD;  Location: [place] [facility];  Service: Cardiovascular;  Laterality: N/A;  . AORTIC VALVE REPLACEMENT N/A 09/15/2016   Procedure: AORTIC VALVE REPLACEMENT (AVR);  Surgeon: Grace Isaac, MD;  Location: [place];  Service: Open Heart Surgery;  Laterality: N/A;  Using 24mm Edwards Perimount Magna Ease Aortic Bioprosthesis Valve  . ASCENDING AORTIC ROOT REPLACEMENT N/A 09/15/2016   Procedure: ASCENDING AORTIC ROOT REPLACEMENT;  Surgeon: Grace Isaac, MD;  Location: [place];  Service: Open Heart Surgery;  Laterality: N/A;  Using 24mm Gelweave Valsalva Graft  . COLONOSCOPY    . COLONOSCOPY WITH ESOPHAGOGASTRODUODENOSCOPY (EGD)    . IR THORACENTESIS ASP PLEURAL SPACE W/IMG GUIDE  10/23/2016    . MULTIPLE EXTRACTIONS WITH ALVEOLOPLASTY N/A 06/10/2016   Procedure: Extraction of tooth #'s 2,8,13,15, and 29  with alveoloplasty, maxillary right and left buccal exostoses reductions, and gross debridement of remaining teeth.;  Surgeon: Lenn Cal, DDS;  Location: [place];  Service: Oral Surgery;  Laterality: N/A;  . RIGHT/LEFT HEART CATH AND CORONARY ANGIOGRAPHY N/A 04/16/2016   Procedure: Right/Left Heart Cath and Coronary Angiography;  Surgeon: [name] Martinique, MD;  Location: [place] [facility];  Service: Cardiovascular;  Laterality: N/A;  . TEE WITHOUT CARDIOVERSION N/A 09/15/2016   Procedure: TRANSESOPHAGEAL ECHOCARDIOGRAM (TEE);  Surgeon: Grace Isaac, MD;  Location: [place];  Service: Open Heart Surgery;  Laterality: N/A;    FAMILY HISTORY: Family History  Problem Relation Age of Onset  . Heart disease  Father   . Heart attack Father   . Thyroid disease Sister     SOCIAL HISTORY: Social History   Socioeconomic History  . Marital status: Married    Spouse name: Not on file  . Number of children: 1  . Years of education: Not on file  . Highest education level: Not on file  Social Needs  . Financial resource strain: Not on file  . Food insecurity - worry: Not on file  . Food insecurity - inability: Not on file  . Transportation needs - medical: Not on file  . Transportation needs - non-medical: Not on file  Occupational History  . Occupation: Musician  Tobacco Use  . Smoking status: Former Smoker    Packs/day: 1.00    Years: 25.00    Pack years: 25.00    Types: Cigarettes  . Smokeless tobacco: Never Used  Substance and Sexual Activity  . Alcohol use: No    Alcohol/week: 0.0 oz  . Drug use: No  . Sexual activity: Not on file  Other Topics Concern  . 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     PHYSICAL EXAM  Vitals:   03/04/17 1410  BP: 106/72  Pulse: 60  Weight: 197 lb (89.4 kg)  Height: 5'  7.5" (1.715 m)   Body mass index is 30.4 kg/m.  Generalized: Well developed, in no acute distress  Head: normocephalic and atraumatic,. Oropharynx benign  Neck: Supple,  Musculoskeletal: No deformity   Neurological examination   Mentation: Alert oriented to time, place, history taking. Attention span and concentration appropriate. Recent and remote memory intact.  Follows all commands speech and language fluent.   Cranial nerve II-XII: Pupils were equal round reactive to light extraocular movements were full, visual field were full on confrontational test. Facial sensation and strength were normal. hearing was intact to finger rubbing bilaterally. Uvula tongue midline. head turning and shoulder shrug were normal and symmetric.Tongue protrusion into cheek strength was normal. Motor: normal bulk and tone, full strength in the BUE, BLE,  Sensory: normal and symmetric to light touch,  Coordination: finger-nose-finger,  no dysmetria Gait and Station: Rising up from seated position without assistance, normal stance ambulates with single-point cane without difficulty .   DIAGNOSTIC DATA (LABS, IMAGING, TESTING) - I reviewed patient records, labs, notes, testing and imaging myself where available.  Lab Results  Component Value Date   WBC 6.9 11/04/2016   HGB 10.8 (L) 11/04/2016   HCT 35.8 (L) 11/04/2016   MCV 89.7 11/04/2016   PLT 120 (L) 11/04/2016      Component Value Date/Time   NA 140 11/04/2016 0838   NA 137 10/14/2016 0804   K 3.9 11/04/2016 0838   CL 100 (L) 11/04/2016 0838   CO2 27 11/04/2016 0838   GLUCOSE 136 (H) 11/04/2016 0838   BUN 11 11/04/2016 0838   BUN 11 10/14/2016 0804   CREATININE 0.87 11/04/2016 0838   CREATININE 0.75 04/10/2016 1132   CALCIUM 9.2 11/04/2016 0838   PROT 7.5 10/29/2016 1650   ALBUMIN 3.5 10/29/2016 1650   AST 30 10/29/2016 1650   ALT 32 10/29/2016 1650   ALKPHOS 88 10/29/2016 1650   BILITOT 0.8 10/29/2016 1650   GFRNONAA >60 11/04/2016  0838   GFRAA >60 11/04/2016 0838   Lab Results  Component Value Date   CHOL 135 10/07/2016   HDL 34 (L) 10/07/2016   LDLCALC  86 10/07/2016   TRIG 75 10/07/2016   CHOLHDL 4.0 10/07/2016   Lab Results  Component Value Date   HGBA1C 5.3 09/11/2016    Lab Results  Component Value Date   TSH 1.611 04/28/2010      ASSESSMENT AND PLAN  67 y.o. year old male  has a past medical history of obstructive sleep apnea recently diagnosed new on CPAP.Compliance data dated 02/02/2017 to 03/03/2017 shows compliance greater than 4 hours at 47%.  Average usage 4 hours 26 minutes pressure set 6-14 cm.  AHI 3.5 significant leak.      Stop by sleep lab on the way out for mask fitting with Robin CPAP compliance 47 % reviewed data with patient F/U in 3 months for repeat compliance Dennie Bible, [facility], [facility], [facility] [address], [place], [place] [address] [phone]

## 2017-03-04 ENCOUNTER — Ambulatory Visit: Payer: Self-pay | Admitting: Nurse Practitioner

## 2017-03-04 ENCOUNTER — Encounter: Payer: Self-pay | Admitting: Nurse Practitioner

## 2017-03-04 ENCOUNTER — Ambulatory Visit (INDEPENDENT_AMBULATORY_CARE_PROVIDER_SITE_OTHER): Payer: Medicare Other | Admitting: Nurse Practitioner

## 2017-03-04 DIAGNOSIS — Z9989 Dependence on other enabling machines and devices: Secondary | ICD-10-CM

## 2017-03-04 DIAGNOSIS — G4733 Obstructive sleep apnea (adult) (pediatric): Secondary | ICD-10-CM | POA: Diagnosis not present

## 2017-03-04 NOTE — Patient Instructions (Signed)
Stop by sleep lab on the way out for mask fitting Lonnie Hansen CPAP compliance 47 % F/U in 3 months for repeat compliance

## 2017-03-04 NOTE — Progress Notes (Signed)
I agree with the assessment and plan as directed by NP .The patient is known to me .   Brieonna Crutcher, MD  

## 2017-03-04 NOTE — Progress Notes (Signed)
I agree with the assessment and plan as directed by NP .The patient is known to me .   Nohelani Benning, MD  

## 2017-03-05 ENCOUNTER — Encounter (HOSPITAL_COMMUNITY)
Admission: RE | Admit: 2017-03-05 | Discharge: 2017-03-05 | Disposition: A | Discharge status: Home / Self Care | Payer: Medicare Other | Source: Ambulatory Visit | Attending: Cardiology | Admitting: Cardiology

## 2017-03-05 DIAGNOSIS — Z952 Presence of prosthetic heart valve: Secondary | ICD-10-CM

## 2017-03-08 ENCOUNTER — Encounter (HOSPITAL_COMMUNITY)
Admission: RE | Admit: 2017-03-08 | Discharge: 2017-03-08 | Disposition: A | Discharge status: Home / Self Care | Payer: Medicare Other | Source: Ambulatory Visit | Attending: Cardiology | Admitting: Cardiology

## 2017-03-08 DIAGNOSIS — Z952 Presence of prosthetic heart valve: Secondary | ICD-10-CM | POA: Diagnosis not present

## 2017-03-10 ENCOUNTER — Encounter (HOSPITAL_COMMUNITY)
Admission: RE | Admit: 2017-03-10 | Discharge: 2017-03-10 | Disposition: A | Discharge status: Home / Self Care | Payer: Medicare Other | Source: Ambulatory Visit | Attending: Cardiology | Admitting: Cardiology

## 2017-03-10 DIAGNOSIS — Z952 Presence of prosthetic heart valve: Secondary | ICD-10-CM | POA: Diagnosis not present

## 2017-03-12 ENCOUNTER — Encounter (HOSPITAL_COMMUNITY)
Admission: RE | Admit: 2017-03-12 | Discharge: 2017-03-12 | Disposition: A | Discharge status: Home / Self Care | Payer: Medicare Other | Source: Ambulatory Visit | Attending: Cardiology | Admitting: Cardiology

## 2017-03-12 DIAGNOSIS — Z952 Presence of prosthetic heart valve: Secondary | ICD-10-CM | POA: Diagnosis not present

## 2017-03-15 ENCOUNTER — Telehealth: Payer: Self-pay | Admitting: Cardiology

## 2017-03-15 ENCOUNTER — Encounter (HOSPITAL_COMMUNITY): Payer: Self-pay

## 2017-03-15 ENCOUNTER — Encounter (HOSPITAL_COMMUNITY)
Admission: RE | Admit: 2017-03-15 | Discharge: 2017-03-15 | Disposition: A | Discharge status: Home / Self Care | Payer: Medicare Other | Source: Ambulatory Visit | Attending: Cardiology | Admitting: Cardiology

## 2017-03-15 DIAGNOSIS — Z952 Presence of prosthetic heart valve: Secondary | ICD-10-CM | POA: Diagnosis not present

## 2017-03-15 NOTE — Telephone Encounter (Addendum)
Follow up appt made with Dr Martinique  For 04-02-17 at 9:40 am S/P AVR . CY

## 2017-03-15 NOTE — Telephone Encounter (Signed)
New message      Dr Servando Snare wants him to have another Cath and see If everything is working properly with his aortic valve.    Is he suppose to have follow up appt in March?  Also he complete his cardiac rehab today and they need to have the maintenance approved for him to start that as well.

## 2017-03-18 ENCOUNTER — Encounter (HOSPITAL_COMMUNITY): Payer: Self-pay

## 2017-03-18 NOTE — Progress Notes (Signed)
Cardiac Individual Treatment Plan  Patient Details  Name: Lonnie Hansen MRN: [id number] Date of Birth: 1950/09/16 Referring Provider:     CARDIAC REHAB PHASE II ORIENTATION from 12/03/2016 in [facility]  Referring Provider  Jordan,Peter MD      Initial Encounter Date:    CARDIAC REHAB PHASE II ORIENTATION from 12/03/2016 in [facility]  Date  12/03/16  Referring Provider  Jordan,Peter MD      Visit Diagnosis: S/P AVR (aortic valve replacement)  Patient's Home Medications on Admission:  Current Outpatient Medications:  .  amLODipine (NORVASC) 10 MG tablet, Take 10 mg by mouth daily., Disp: , Rfl:  .  aspirin EC 81 MG EC tablet, Take 1 tablet (81 mg total) by mouth daily., Disp: , Rfl:  .  atenolol (TENORMIN) 25 MG tablet, Take 0.5 tablets (12.5 mg total) by mouth 2 (two) times daily., Disp: 30 tablet, Rfl: 1 .  Ferrous Sulfate (IRON) 325 (65 Fe) MG TABS, Take 1 tablet by mouth daily., Disp: , Rfl:  .  furosemide (LASIX) 20 MG tablet, TAKE 1 TABLET BY MOUTH ONCE DAILY, Disp: 90 tablet, Rfl: 3 .  latanoprost (XALATAN) 0.005 % ophthalmic solution, Place 1 drop into both eyes at bedtime., Disp: , Rfl:  .  Multiple Vitamin (MULTIVITAMIN WITH MINERALS) TABS tablet, Take 1 tablet by mouth daily., Disp: , Rfl:  .  potassium chloride SA (K-DUR,KLOR-CON) 20 MEQ tablet, Take 1 tablet (20 mEq total) daily by mouth., Disp: 90 tablet, Rfl: 3  Past Medical History: Past Medical History:  Diagnosis Date  . Anemia   . Arthritis   . Asymptomatic bilateral carotid artery stenosis 08/2015   1-39%   . Blood dyscrasia    "trouble with my bloods clotting"  . Bruising    on the skin states due to platelets or sometimes low or high  . Cataracts, bilateral   . Coronary artery disease   . Diverticulosis   . Enlarged aorta ([place])   . Enlarged prostate    slightly  . GERD (gastroesophageal reflux disease)    takes Pantoprazole daily as  needed  . Glaucoma    uses eye drops daily  . Headache   . History of colon polyps    benign  . History of kidney stones   . Hyperlipidemia    no on any meds  . Hypertension    takes Amlodipine and Atenolol daily  . Joint pain   . Joint swelling   . Nocturia   . Sleep apnea   . Vocal cord nodule    pt. states  it's a" growth on vocal cord"    Tobacco Use: Social History   Tobacco Use  Smoking Status Former Smoker  . Packs/day: 1.00  . Years: 25.00  . Pack years: 25.00  . Types: Cigarettes  Smokeless Tobacco Never Used    Labs: Recent Review Flowsheet Data    Labs for ITP Cardiac and Pulmonary Rehab Latest Ref Rng & Units 09/17/2016 09/18/2016 09/19/2016 09/20/2016 10/07/2016   Cholestrol 100 - 199 mg/dL - - - - 135   LDLCALC 0 - 99 mg/dL - - - - 86   HDL >39 mg/dL - - - - 34(L)   Trlycerides 0 - 149 mg/dL - - - - 75   Hemoglobin A1c 4.8 - 5.6 % - - - - -   PHART 7.350 - 7.450 - - - - -   PCO2ART 32.0 - 48.0 mmHg - - - - -  HCO3 20.0 - 28.0 mmol/L - - - - -   TCO2 0 - 100 mmol/L - - - - -   ACIDBASEDEF 0.0 - 2.0 mmol/L - - - - -   O2SAT % 55.5 81.3 50.6 59.9 -      Capillary Blood Glucose: Lab Results  Component Value Date   GLUCAP 116 (H) 09/21/2016   GLUCAP 105 (H) 09/21/2016   GLUCAP 133 (H) 09/20/2016   GLUCAP 106 (H) 09/20/2016   GLUCAP 110 (H) 09/20/2016     Exercise Target Goals:    Exercise Program Goal: Individual exercise prescription set using results from initial 6 min walk test and THRR while considering  patient's activity barriers and safety.   Exercise Prescription Goal: Initial exercise prescription builds to 30-45 minutes a day of aerobic activity, 2-3 days per week.  Home exercise guidelines will be given to patient during program as part of exercise prescription that the participant will acknowledge.  Activity Barriers & Risk Stratification: Activity Barriers & Cardiac Risk Stratification - 12/03/16 1352      Activity Barriers &  Cardiac Risk Stratification   Activity Barriers  Muscular Weakness;Deconditioning;Incisional Pain;Assistive Device    Cardiac Risk Stratification  High       6 Minute Walk: 6 Minute Walk    Row Name 12/03/16 1352 12/03/16 1603 03/03/17 1527     6 Minute Walk   Phase  Initial  Initial  Discharge   Distance  -  1006 feet  1150 feet   Walk Time  -  6 minutes  6 minutes   # of Rest Breaks  -  0  0   MPH  -  1.91  2.2   METS  -  2.48  2.5   RPE  -  13  14   VO2 Peak  -  8.67  -   Symptoms  -  Yes (comment)  Yes (comment)   Comments  -  L hip pain 3/10  R hip pain   Resting HR  -  89 bpm  69 bpm   Resting BP  -  122/80  126/82   Resting Oxygen Saturation   -  97 %  -   Exercise Oxygen Saturation  during 6 min walk  -  98 %  -   Max Ex. HR  -  107 bpm  89 bpm   Max Ex. BP  -  118/72  130/80   2 Minute Post BP  -  -  110/62   Row Name 03/03/17 1529         6 Minute Walk   Distance % Change  14.31 %  (Pended)      Distance Feet Change  144 ft  (Pended)         Oxygen Initial Assessment:   Oxygen Re-Evaluation:   Oxygen Discharge (Final Oxygen Re-Evaluation):   Initial Exercise Prescription: Initial Exercise Prescription - 12/03/16 1600      Date of Initial Exercise RX and Referring Provider   Date  12/03/16    Referring Provider  Jordan,Peter MD      Recumbant Bike   Level  2    Minutes  10    METs  1.5      NuStep   Level  2    SPM  70    Minutes  10    METs  2      Arm Ergometer   Level  1    Minutes  10    METs  1      Prescription Details   Frequency (times per week)  3    Duration  Progress to 30 minutes of continuous aerobic without signs/symptoms of physical distress      Intensity   THRR 40-80% of Max Heartrate  61-122    Ratings of Perceived Exertion  11-13    Perceived Dyspnea  0-4      Progression   Progression  Continue to progress workloads to maintain intensity without signs/symptoms of physical distress.      Resistance Training    Training Prescription  Yes    Weight  2lbs    Reps  10-15       Perform Capillary Blood Glucose checks as needed.  Exercise Prescription Changes: Exercise Prescription Changes    Row Name 12/09/16 1615 12/24/16 1600 01/01/17 1653 01/13/17 1600 02/01/17 1600     Response to Exercise   Blood Pressure (Admit)  107/70  122/82  120/80  118/78  110/70   Blood Pressure (Exercise)  142/70  118/70  124/60  126/80  120/70   Blood Pressure (Exit)  108/70  114/70  110/74  110/80  106/70   Heart Rate (Admit)  87 bpm  85 bpm  86 bpm  73 bpm  83 bpm   Heart Rate (Exercise)  94 bpm  96 bpm  94 bpm  84 bpm  97 bpm   Heart Rate (Exit)  75 bpm  66 bpm  83 bpm  62 bpm  69 bpm   Perceived Dyspnea (Exercise)  13  15  13  14  13    Duration  Progress to 30 minutes of  aerobic without signs/symptoms of physical distress  Progress to 30 minutes of  aerobic without signs/symptoms of physical distress  Progress to 30 minutes of  aerobic without signs/symptoms of physical distress  Progress to 30 minutes of  aerobic without signs/symptoms of physical distress  Progress to 30 minutes of  aerobic without signs/symptoms of physical distress   Intensity  THRR unchanged  THRR unchanged  THRR unchanged  THRR unchanged  THRR unchanged     Progression   Progression  Continue to progress workloads to maintain intensity without signs/symptoms of physical distress.  Continue to progress workloads to maintain intensity without signs/symptoms of physical distress.  Continue to progress workloads to maintain intensity without signs/symptoms of physical distress.  Continue to progress workloads to maintain intensity without signs/symptoms of physical distress.  Continue to progress workloads to maintain intensity without signs/symptoms of physical distress.   Average METs  2.3  2.5  2.4  2.3  2.3     Resistance Training   Training Prescription  No Relaxation Day   No  Yes  No  Yes   Weight  -  -  2lbs  -  3lbs   Reps  -  -   10-15  -  10-15   Time  -  -  10 Minutes  -  10 Minutes     Interval Training   Interval Training  -  -  No  No  No     Recumbant Bike   Level  1  2.2  -  -  -   Minutes  15  15  -  -  -   METs  2  2.56  -  -  -     NuStep   Level  1  1  4   4  4   SPM  70  70  70  70  70   Minutes  15  15  15  15  15    METs  2.1  1  2.3  2.2  2.3     Arm Ergometer   Level  -  -  1  1  1    Minutes  -  -  15  15  15    METs  -  -  2.51  2.49  2.3     Home Exercise Plan   Plans to continue exercise at  -  Home (comment) Walking   Home (comment) Walking   Home (comment) Walking   Home (comment) Walking    Frequency  -  Add 2 additional days to program exercise sessions.  Add 2 additional days to program exercise sessions.  Add 2 additional days to program exercise sessions.  Add 2 additional days to program exercise sessions.   Initial Home Exercises Provided  -  12/23/16  12/23/16  12/23/16  12/23/16   Row Name 02/17/17 1000 03/02/17 1600 03/16/17 1400         Response to Exercise   Blood Pressure (Admit)  110/70  122/60  118/60     Blood Pressure (Exercise)  108/60  138/80  120/62     Blood Pressure (Exit)  120/60  102/70  124/82     Heart Rate (Admit)  70 bpm  82 bpm  78 bpm     Heart Rate (Exercise)  74 bpm  91 bpm  85 bpm     Heart Rate (Exit)  71 bpm  65 bpm  52 bpm     Perceived Dyspnea (Exercise)  13  13  12      Duration  Progress to 30 minutes of  aerobic without signs/symptoms of physical distress  Progress to 30 minutes of  aerobic without signs/symptoms of physical distress  Progress to 30 minutes of  aerobic without signs/symptoms of physical distress     Intensity  THRR unchanged  THRR unchanged  THRR unchanged       Progression   Progression  Continue to progress workloads to maintain intensity without signs/symptoms of physical distress.  Continue to progress workloads to maintain intensity without signs/symptoms of physical distress.  Continue to progress workloads to maintain  intensity without signs/symptoms of physical distress.     Average METs  2.2  2.1  2.4       Resistance Training   Training Prescription  Yes  Yes  Yes     Weight  2lb  3lbs  3lbs     Reps  10-15  10-15  10-15     Time  10 Minutes  10 Minutes  10 Minutes       Interval Training   Interval Training  No  No  No       Recumbant Bike   Level  2.2  3  -     Minutes  15  15  -     METs  2  2  -       NuStep   Level  3  4  4      SPM  70  60  70     Minutes  15  15  15      METs  2.4  2.2  2.4       Arm Ergometer   Level  -  -  1     Minutes  -  -  15     METs  -  -  2.48       Home Exercise Plan   Plans to continue exercise at  Home (comment) Walking   Home (comment) Walking   Home (comment) Walking      Frequency  Add 2 additional days to program exercise sessions.  Add 2 additional days to program exercise sessions.  Add 2 additional days to program exercise sessions.     Initial Home Exercises Provided  12/23/16  12/23/16  12/23/16        Exercise Comments: Exercise Comments    Row Name 12/24/16 1606 01/13/17 1658 02/08/17 1627 03/16/17 1434     Exercise Comments  Reviewed METs and goals with pt. Will continue to monitor pt's progress.   Pt is responding well to exercise. Pt will continue to work on increasing his physical activity levels at home by walking.   Reviewed METs and goals. Pt is tolerating light exercise very well; will continue to monitor pt's progress and activity levels.   Reviewed METs and goals. Pt is tolerating light exercise very well; will continue to monitor pt's progress and activity levels.        Exercise Goals and Review: Exercise Goals    Row Name 12/03/16 1415             Exercise Goals   Increase Physical Activity  Yes       Intervention  Provide advice, education, support and counseling about physical activity/exercise needs.;Develop an individualized exercise prescription for aerobic and resistive training based on initial evaluation  findings, risk stratification, comorbidities and participant's personal goals.       Expected Outcomes  Achievement of increased cardiorespiratory fitness and enhanced flexibility, muscular endurance and strength shown through measurements of functional capacity and personal statement of participant.       Increase Strength and Stamina  Yes       Intervention  Provide advice, education, support and counseling about physical activity/exercise needs.;Develop an individualized exercise prescription for aerobic and resistive training based on initial evaluation findings, risk stratification, comorbidities and participant's personal goals.       Expected Outcomes  Achievement of increased cardiorespiratory fitness and enhanced flexibility, muscular endurance and strength shown through measurements of functional capacity and personal statement of participant.       Able to understand and use rate of perceived exertion (RPE) scale  Yes       Intervention  Provide education and explanation on how to use RPE scale       Expected Outcomes  Long Term:  Able to use RPE to guide intensity level when exercising independently;Short Term: Able to use RPE daily in rehab to express subjective intensity level       Knowledge and understanding of Target Heart Rate Range (THRR)  Yes       Intervention  Provide education and explanation of THRR including how the numbers were predicted and where they are located for reference       Expected Outcomes  Long Term: Able to use THRR to govern intensity when exercising independently;Short Term: Able to use daily as guideline for intensity in rehab;Short Term: Able to state/look up THRR       Able to check pulse independently  Yes       Intervention  Provide education and demonstration on how to check pulse in carotid and radial arteries.;Review the importance of being able to check your own pulse for safety during independent exercise  Expected Outcomes  Short Term: Able to  explain why pulse checking is important during independent exercise;Long Term: Able to check pulse independently and accurately       Understanding of Exercise Prescription  Yes       Intervention  Provide education, explanation, and written materials on patient's individual exercise prescription       Expected Outcomes  Short Term: Able to explain program exercise prescription;Long Term: Able to explain home exercise prescription to exercise independently          Exercise Goals Re-Evaluation : Exercise Goals Re-Evaluation    Row Name 12/23/16 1553 12/24/16 1606 01/13/17 1656 02/08/17 1626 02/08/17 1628     Exercise Goal Re-Evaluation   Exercise Goals Review  Increase Physical Activity;Understanding of Exercise Prescription;Increase Strength and Stamina;Knowledge and understanding of Target Heart Rate Range (THRR);Able to understand and use rate of perceived exertion (RPE) scale;Able to check pulse independently  Increase Physical Activity;Understanding of Exercise Prescription;Increase Strength and Stamina;Knowledge and understanding of Target Heart Rate Range (THRR);Able to understand and use rate of perceived exertion (RPE) scale;Able to check pulse independently  Increase Physical Activity  Increase Physical Activity;Able to understand and use Dyspnea scale;Understanding of Exercise Prescription  Increase Physical Activity;Understanding of Exercise Prescription;Increase Strength and Stamina;Knowledge and understanding of Target Heart Rate Range (THRR);Able to understand and use rate of perceived exertion (RPE) scale;Able to check pulse independently   Comments  Reviewed home exercise with pt today.  Pt plans to walk and do handheld weights for exercise, 2x/week in addition to coming to cardiac rehab.  Reviewed THR, pulse, RPE, sign and symptoms, and when to call 911 or MD.  Also discussed weather considerations and indoor options.  Pt voiced understanding.  -  Pt is responding well to exericse  prescription. Pt has progressed to level 4 on NuStep. Will continue to monitor patient's progress.   -  Pt is active at home and in the community with playing in the band, and doing light exercises at home. Express to patient exercise/activity limitations and how to exercise safely at home. Pt voiced understanding.    Expected Outcomes  Pt will be compliant with HEP and improve in aerobic and functional fitness  -  Pt will continue to increase physical activity at home by walking 2-3 days a week.   -  Pt will continue to improve in cardiorespiratory fitness and functional mobility.   [place] Name 03/16/17 1434             Exercise Goal Re-Evaluation   Exercise Goals Review  Increase Physical Activity;Understanding of Exercise Prescription;Increase Strength and Stamina;Knowledge and understanding of Target Heart Rate Range (THRR);Able to understand and use rate of perceived exertion (RPE) scale;Able to check pulse independently       Comments  Pt stated "he is much stronger than before" and has return to playing in the band. Pt is active in the community and with home exercise.        Expected Outcomes  Pt will continue to improve in cardiorespiratory fitness and functional mobility.           Discharge Exercise Prescription (Final Exercise Prescription Changes): Exercise Prescription Changes - 03/16/17 1400      Response to Exercise   Blood Pressure (Admit)  118/60    Blood Pressure (Exercise)  120/62    Blood Pressure (Exit)  124/82    Heart Rate (Admit)  78 bpm    Heart Rate (Exercise)  85 bpm    Heart  Rate (Exit)  52 bpm    Perceived Dyspnea (Exercise)  12    Duration  Progress to 30 minutes of  aerobic without signs/symptoms of physical distress    Intensity  THRR unchanged      Progression   Progression  Continue to progress workloads to maintain intensity without signs/symptoms of physical distress.    Average METs  2.4      Resistance Training   Training Prescription  Yes     Weight  3lbs    Reps  10-15    Time  10 Minutes      Interval Training   Interval Training  No      NuStep   Level  4    SPM  70    Minutes  15    METs  2.4      Arm Ergometer   Level  1    Minutes  15    METs  2.48      Home Exercise Plan   Plans to continue exercise at  Home (comment) Walking     Frequency  Add 2 additional days to program exercise sessions.    Initial Home Exercises Provided  12/23/16       Nutrition:  Target Goals: Understanding of nutrition guidelines, daily intake of sodium 1500mg , cholesterol 200mg , calories 30% from fat and 7% or less from saturated fats, daily to have 5 or more servings of fruits and vegetables.  Biometrics: Pre Biometrics - 12/03/16 1608      Pre Biometrics   Waist Circumference  39.5 inches    Hip Circumference  43 inches    Waist to Hip Ratio  0.92 %    Triceps Skinfold  22 mm    % Body Fat  29.3 %    Grip Strength  24 kg    Flexibility  8 in    Single Leg Stand  17 seconds      Post Biometrics - 03/03/17 1528       Post  Biometrics   Height  5' 7.5" (1.715 m)    Weight  194 lb 14.2 oz (88.4 kg)    Waist Circumference  40.5 inches    Hip Circumference  44 inches    Waist to Hip Ratio  0.92 %    BMI (Calculated)  30.06    Triceps Skinfold  22 mm    % Body Fat  30.3 %    Grip Strength  33 kg    Flexibility  12 in    Single Leg Stand  12.56 seconds       Nutrition Therapy Plan and Nutrition Goals: Nutrition Therapy & Goals - 12/04/16 1422      Nutrition Therapy   Diet  Therapeutic Lifestyle Changes      Personal Nutrition Goals   Nutrition Goal  Pt to identify food quantities necessary to achieve weight loss of 6-11 lb at graduation from cardiac rehab. Goal wt of 175 lb desired.       Intervention Plan   Intervention  Prescribe, educate and counsel regarding individualized specific dietary modifications aiming towards targeted core components such as weight, hypertension, lipid management, diabetes,  heart failure and other comorbidities.    Expected Outcomes  Short Term Goal: Understand basic principles of dietary content, such as calories, fat, sodium, cholesterol and nutrients.;Long Term Goal: Adherence to prescribed nutrition plan.       Nutrition Assessments:   Nutrition Goals Re-Evaluation:   Nutrition Goals Re-Evaluation:  Nutrition Goals Discharge (Final Nutrition Goals Re-Evaluation):   Psychosocial: Target Goals: Acknowledge presence or absence of significant depression and/or stress, maximize coping skills, provide positive support system. Participant is able to verbalize types and ability to use techniques and skills needed for reducing stress and depression.  Initial Review & Psychosocial Screening: Initial Psych Review & Screening - 12/03/16 1628      Initial Review   Current issues with  Current Depression;Current Anxiety/Panic;Current Stress Concerns rates as low    Source of Stress Concerns  Chronic Illness      Family Dynamics   Good Support System?  Yes Family, Faith and Fraternity - [employer]      Barriers   Psychosocial barriers to participate in program  The patient should benefit from training in stress management and relaxation.      Screening Interventions   Interventions  Encouraged to exercise       Quality of Life Scores: Quality of Life - 01/08/17 1554      Quality of Life Scores   GLOBAL Pre  -- scores reviewed with pt.  pt expresses disappointment in necessary dietary changes to eat heart healthy diet.,  pt encouraged to participate in nutrition education classes to help incorporate pleasing taste with heart healty selections.       Scores of 19 and below usually indicate a poorer quality of life in these areas.  A difference of  2-3 points is a clinically meaningful difference.  A difference of 2-3 points in the total score of the Quality of Life Index has been associated with significant improvement in overall quality of life,  self-image, physical symptoms, and general health in studies assessing change in quality of life.  PHQ-9: Recent Review Flowsheet Data    Depression screen [facility] 2/9 03/15/2017 12/07/2016   Decreased Interest 0 0   Down, Depressed, Hopeless 0 0   PHQ - 2 Score 0 0     Interpretation of Total Score  Total Score Depression Severity:  1-4 = Minimal depression, 5-9 = Mild depression, 10-14 = Moderate depression, 15-19 = Moderately severe depression, 20-27 = Severe depression   Psychosocial Evaluation and Intervention: Psychosocial Evaluation - 03/15/17 1609      Discharge Psychosocial Assessment & Intervention   Comments  no psychosocial needs identified, no interventions necessary        Psychosocial Re-Evaluation: Psychosocial Re-Evaluation    [place] Name 12/25/16 1659 01/22/17 1627 02/12/17 1643 03/18/17 1119       Psychosocial Re-Evaluation   Current issues with  None Identified  None Identified  None Identified  None Identified    Comments  no psychosocial needs identified, no interventions necessary   no psychosocial needs identified, no interventions necessary   no psychosocial needs identified, no interventions necessary   no psychosocial needs identified, no interventions necessary     Expected Outcomes  pt will exhibit positive outlook with good coping skills.   pt will exhibit positive outlook with good coping skills.   pt will exhibit positive outlook with good coping skills.   pt will exhibit positive outlook with good coping skills.     Interventions  Encouraged to attend Cardiac Rehabilitation for the exercise  Encouraged to attend Cardiac Rehabilitation for the exercise  Encouraged to attend Cardiac Rehabilitation for the exercise  Encouraged to attend Cardiac Rehabilitation for the exercise    Continue Psychosocial Services   No Follow up required  No Follow up required  No Follow up required  No  Follow up required       Psychosocial Discharge (Final Psychosocial  Re-Evaluation): Psychosocial Re-Evaluation - 03/18/17 1119      Psychosocial Re-Evaluation   Current issues with  None Identified    Comments  no psychosocial needs identified, no interventions necessary     Expected Outcomes  pt will exhibit positive outlook with good coping skills.     Interventions  Encouraged to attend Cardiac Rehabilitation for the exercise    Continue Psychosocial Services   No Follow up required       Vocational Rehabilitation: Provide vocational rehab assistance to qualifying candidates.   Vocational Rehab Evaluation & Intervention: Vocational Rehab - 12/07/16 1524      Initial Vocational Rehab Evaluation & Intervention   Assessment shows need for Vocational Rehabilitation  No       Education: Education Goals: Education classes will be provided on a weekly basis, covering required topics. Participant will state understanding/return demonstration of topics presented.  Learning Barriers/Preferences: Learning Barriers/Preferences - 12/03/16 1351      Learning Barriers/Preferences   Learning Barriers  Sight    Learning Preferences  Written Material;Verbal Instruction;Skilled Demonstration;Pictoral;Computer/Internet;Video       Education Topics: Count Your Pulse:  -Group instruction provided by verbal instruction, demonstration, patient participation and written materials to support subject.  Instructors address importance of being able to find your pulse and how to count your pulse when at home without a heart monitor.  Patients get hands on experience counting their pulse with staff help and individually.   Heart Attack, Angina, and Risk Factor Modification:  -Group instruction provided by verbal instruction, video, and written materials to support subject.  Instructors address signs and symptoms of angina and heart attacks.    Also discuss risk factors for heart disease and how to make changes to improve heart health risk factors.   CARDIAC REHAB PHASE  II EXERCISE from 02/10/2017 in [place]  Date  01/27/17  Instruction Review Code (Retired)  2- meets goals/outcomes      Functional Fitness:  -Group instruction provided by verbal instruction, demonstration, patient participation, and written materials to support subject.  Instructors address safety measures for doing things around the house.  Discuss how to get up and down off the floor, how to pick things up properly, how to safely get out of a chair without assistance, and balance training.   Meditation and Mindfulness:  -Group instruction provided by verbal instruction, patient participation, and written materials to support subject.  Instructor addresses importance of mindfulness and meditation practice to help reduce stress and improve awareness.  Instructor also leads participants through a meditation exercise.    Stretching for Flexibility and Mobility:  -Group instruction provided by verbal instruction, patient participation, and written materials to support subject.  Instructors lead participants through series of stretches that are designed to increase flexibility thus improving mobility.  These stretches are additional exercise for major muscle groups that are typically performed during regular warm up and cool down.   Hands Only CPR:  -Group verbal, video, and participation provides a basic overview of AHA guidelines for community CPR. Role-play of emergencies allow participants the opportunity to practice calling for help and chest compression technique with discussion of AED use.   Hypertension: -Group verbal and written instruction that provides a basic overview of hypertension including the most recent diagnostic guidelines, risk factor reduction with self-care instructions and medication management.    Nutrition I class: Heart Healthy Eating:  -Group  instruction provided by PowerPoint slides, verbal discussion, and written materials to  support subject matter. The instructor gives an explanation and review of the Therapeutic Lifestyle Changes diet recommendations, which includes a discussion on lipid goals, dietary fat, sodium, fiber, plant stanol/sterol esters, sugar, and the components of a well-balanced, healthy diet.   CARDIAC REHAB PHASE II EXERCISE from 02/10/2017 in [place]  Date  01/11/17  Educator  RD  Instruction Review Code (Retired)  Not applicable      Nutrition II class: Lifestyle Skills:  -Group instruction provided by [employer], verbal discussion, and written materials to support subject matter. The instructor gives an explanation and review of label reading, grocery shopping for heart health, heart healthy recipe modifications, and ways to make healthier choices when eating out.   CARDIAC REHAB PHASE II EXERCISE from 02/10/2017 in [place]  Date  01/11/17  Educator  RD  Instruction Review Code (Retired)  Not applicable      Diabetes Question & Answer:  -Group instruction provided by PowerPoint slides, verbal discussion, and written materials to support subject matter. The instructor gives an explanation and review of diabetes co-morbidities, pre- and post-prandial blood glucose goals, pre-exercise blood glucose goals, signs, symptoms, and treatment of hypoglycemia and hyperglycemia, and foot care basics.   Diabetes Blitz:  -Group instruction provided by PowerPoint slides, verbal discussion, and written materials to support subject matter. The instructor gives an explanation and review of the physiology behind type 1 and type 2 diabetes, diabetes medications and rational behind using different medications, pre- and post-prandial blood glucose recommendations and Hemoglobin A1c goals, diabetes diet, and exercise including blood glucose guidelines for exercising safely.    Portion Distortion:  -Group instruction provided by PowerPoint  slides, verbal discussion, written materials, and food models to support subject matter. The instructor gives an explanation of serving size versus portion size, changes in portions sizes over the last 20 years, and what consists of a serving from each food group.   CARDIAC REHAB PHASE II EXERCISE from 02/10/2017 in [place]  Date  01/13/17  Educator  RD  Instruction Review Code (Retired)  2- meets goals/outcomes      Stress Management:  -Group instruction provided by verbal instruction, video, and written materials to support subject matter.  Instructors review role of stress in heart disease and how to cope with stress positively.     Exercising on Your Own:  -Group instruction provided by verbal instruction, power point, and written materials to support subject.  Instructors discuss benefits of exercise, components of exercise, frequency and intensity of exercise, and end points for exercise.  Also discuss use of nitroglycerin and activating EMS.  Review options of places to exercise outside of rehab.  Review guidelines for sex with heart disease.   CARDIAC REHAB PHASE II EXERCISE from 02/10/2017 in [place]  Date  02/10/17  Educator  EP  Instruction Review Code (Retired)  2- meets goals/outcomes      Cardiac Drugs I:  -Group instruction provided by verbal instruction and written materials to support subject.  Instructor reviews cardiac drug classes: antiplatelets, anticoagulants, beta blockers, and statins.  Instructor discusses reasons, side effects, and lifestyle considerations for each drug class.   CARDIAC REHAB PHASE II EXERCISE from 02/10/2017 in [place]  Date  12/09/16  Instruction Review Code (Retired)  2- meets goals/outcomes  Cardiac Drugs II:  -Group instruction provided by verbal instruction and written materials to support subject.  Instructor reviews cardiac drug  classes: angiotensin converting enzyme inhibitors (ACE-I), angiotensin II receptor blockers (ARBs), nitrates, and calcium channel blockers.  Instructor discusses reasons, side effects, and lifestyle considerations for each drug class.   Anatomy and Physiology of the Circulatory System:  Group verbal and written instruction and models provide basic cardiac anatomy and physiology, with the coronary electrical and arterial systems. Review of: AMI, Angina, Valve disease, Heart Failure, Peripheral Artery Disease, Cardiac Arrhythmia, Pacemakers, and the ICD.   Other Education:  -Group or individual verbal, written, or video instructions that support the educational goals of the cardiac rehab program.   Holiday Eating Survival Tips:  -Group instruction provided by PowerPoint slides, verbal discussion, and written materials to support subject matter. The instructor gives patients tips, tricks, and techniques to help them not only survive but enjoy the holidays despite the onslaught of food that accompanies the holidays.   Knowledge Questionnaire Score: Knowledge Questionnaire Score - 12/03/16 1603      Knowledge Questionnaire Score   Pre Score  20/24       Core Components/Risk Factors/Patient Goals at Admission: Personal Goals and Risk Factors at Admission - 12/03/16 1610      Core Components/Risk Factors/Patient Goals on Admission    Weight Management  Yes;Weight Maintenance;Weight Loss    Intervention  Weight Management: Provide education and appropriate resources to help participant work on and attain dietary goals.;Weight Management: Develop a combined nutrition and exercise program designed to reach desired caloric intake, while maintaining appropriate intake of nutrient and fiber, sodium and fats, and appropriate energy expenditure required for the weight goal.;Weight Management/Obesity: Establish reasonable short term and long term weight goals.    Admit Weight  186 lb 15.2 oz (84.8 kg)     Goal Weight: Short Term  180 lb (81.6 kg)    Goal Weight: Long Term  175 lb (79.4 kg)    Expected Outcomes  Short Term: Continue to assess and modify interventions until short term weight is achieved;Long Term: Adherence to nutrition and physical activity/exercise program aimed toward attainment of established weight goal;Weight Maintenance: Understanding of the daily nutrition guidelines, which includes 25-35% calories from fat, 7% or less cal from saturated fats, less than 200mg  cholesterol, less than 1.5gm of sodium, & 5 or more servings of fruits and vegetables daily;Weight Loss: Understanding of general recommendations for a balanced deficit meal plan, which promotes 1-2 lb weight loss per week and includes a negative energy balance of [phone] kcal/d;Understanding recommendations for meals to include 15-35% energy as protein, 25-35% energy from fat, 35-60% energy from carbohydrates, less than 200mg  of dietary cholesterol, 20-35 gm of total fiber daily;Understanding of distribution of calorie intake throughout the day with the consumption of 4-5 meals/snacks    Hypertension  Yes    Intervention  Provide education on lifestyle modifcations including regular physical activity/exercise, weight management, moderate sodium restriction and increased consumption of fresh fruit, vegetables, and low fat dairy, alcohol moderation, and smoking cessation.;Monitor prescription use compliance.    Expected Outcomes  Short Term: Continued assessment and intervention until BP is < 140/82mm HG in hypertensive participants. < 130/62mm HG in hypertensive participants with diabetes, heart failure or chronic kidney disease.;Long Term: Maintenance of blood pressure at goal levels.    Lipids  Yes    Intervention  Provide education and support for participant on nutrition & aerobic/resistive exercise along with prescribed medications to achieve LDL <  70mg , HDL >40mg .    Expected Outcomes  Short Term: Participant states  understanding of desired cholesterol values and is compliant with medications prescribed. Participant is following exercise prescription and nutrition guidelines.;Long Term: Cholesterol controlled with medications as prescribed, with individualized exercise RX and with personalized nutrition plan. Value goals: LDL < 70mg , HDL > 40 mg.       Core Components/Risk Factors/Patient Goals Review:  Goals and Risk Factor Review    Row Name 12/25/16 1658 01/22/17 1626 02/12/17 1643 03/15/17 1606       Core Components/Risk Factors/Patient Goals Review   Personal Goals Review  Weight Management/Obesity;Hypertension;Lipids  Weight Management/Obesity;Hypertension;Lipids  Weight Management/Obesity;Hypertension;Lipids  Weight Management/Obesity;Hypertension;Lipids    Review  pt with multiple CAD RF demonstrates eagerness to participate in CR opportunities.   pt with multiple CAD RF demonstrates eagerness to participate in CR opportunities. pt demonstrates compliance with hypertension and lipid management regimen.   pt with multiple CAD RF demonstrates eagerness to participate in CR opportunities. pt demonstrates compliance with hypertension and lipid management regimen. pt notes increased energy, muscle mass and vocal cord strength.    pt completed CR program with 36 sessions.  pt demonstrates compliance with hypertension and lipid management regimen. pt notes increased energy, muscle mass and vocal cord strength.  pt is pleased with friendships and nutrition skills he has learned in CR. pt plans to continue exercising in cardiac maintenance program.        Expected Outcomes  pt will participate in CR exercise, nutrition and lifestyle modification opportunities to decrease overall RF.    pt will participate in CR exercise, nutrition and lifestyle modification opportunities to decrease overall RF.    pt will participate in CR exercise, nutrition and lifestyle modification opportunities to decrease overall RF.    pt will  participate in exercise, nutrition and lifestyle modification opportunities to decrease overall RF.         Core Components/Risk Factors/Patient Goals at Discharge (Final Review):  Goals and Risk Factor Review - 03/15/17 1606      Core Components/Risk Factors/Patient Goals Review   Personal Goals Review  Weight Management/Obesity;Hypertension;Lipids    Review  pt completed CR program with 36 sessions.  pt demonstrates compliance with hypertension and lipid management regimen. pt notes increased energy, muscle mass and vocal cord strength.  pt is pleased with friendships and nutrition skills he has learned in CR. pt plans to continue exercising in cardiac maintenance program.        Expected Outcomes  pt will participate in exercise, nutrition and lifestyle modification opportunities to decrease overall RF.         ITP Comments: ITP Comments    Row Name 12/03/16 1344 12/07/16 1521 12/25/16 1657 01/22/17 1626 02/12/17 1643   ITP Comments  Dr. Fransico Him, Medical Director  pt participated in first exercise day without difficulty.   30 day ITP review.  pt with good attendance and participation.   30 day ITP review.  pt with good attendance and participation.   30 day ITP review.  pt with good attendance and participation.    [place] Name 03/15/17 1605           ITP Comments  pt completed CR program with 36 sessions. pt with good attendance and participation. pt congratulated on his success.          Comments:

## 2017-03-29 ENCOUNTER — Encounter (HOSPITAL_COMMUNITY)
Admission: RE | Admit: 2017-03-29 | Discharge: 2017-03-29 | Disposition: A | Discharge status: Home / Self Care | Payer: Self-pay | Source: Ambulatory Visit | Attending: Cardiology | Admitting: Cardiology

## 2017-03-29 DIAGNOSIS — Z952 Presence of prosthetic heart valve: Secondary | ICD-10-CM | POA: Insufficient documentation

## 2017-03-31 ENCOUNTER — Encounter (HOSPITAL_COMMUNITY)
Admission: RE | Admit: 2017-03-31 | Discharge: 2017-03-31 | Disposition: A | Discharge status: Home / Self Care | Payer: Self-pay | Source: Ambulatory Visit | Attending: Cardiology | Admitting: Cardiology

## 2017-03-31 DIAGNOSIS — I351 Nonrheumatic aortic (valve) insufficiency: Secondary | ICD-10-CM | POA: Diagnosis not present

## 2017-03-31 DIAGNOSIS — I10 Essential (primary) hypertension: Secondary | ICD-10-CM | POA: Diagnosis not present

## 2017-04-01 LAB — BASIC METABOLIC PANEL
BUN/Creatinine Ratio: 14 (ref 10–24)
BUN: 13 mg/dL (ref 8–27)
CO2: 24 mmol/L (ref 20–29)
CREATININE: 0.94 mg/dL (ref 0.76–1.27)
Calcium: 9.5 mg/dL (ref 8.6–10.2)
Chloride: 101 mmol/L (ref 96–106)
GFR calc Af Amer: 97 mL/min/{1.73_m2} (ref >59)
GFR calc non Af Amer: 84 mL/min/{1.73_m2} (ref >59)
GLUCOSE: 85 mg/dL (ref 65–99)
Potassium: 3.7 mmol/L (ref 3.5–5.2)
SODIUM: 142 mmol/L (ref 134–144)

## 2017-04-01 LAB — HEPATIC FUNCTION PANEL
ALBUMIN: 4.7 g/dL (ref 3.6–4.8)
ALK PHOS: 47 IU/L (ref 39–117)
ALT: 10 IU/L (ref 0–44)
AST: 22 IU/L (ref 0–40)
Bilirubin Total: 0.5 mg/dL (ref 0.0–1.2)
Bilirubin, Direct: 0.13 mg/dL (ref 0.00–0.40)
TOTAL PROTEIN: 7.4 g/dL (ref 6.0–8.5)

## 2017-04-01 LAB — LIPID PANEL W/O CHOL/HDL RATIO
Cholesterol, Total: 186 mg/dL (ref 100–199)
HDL: 37 mg/dL — AB (ref >39)
LDL Calculated: 105 mg/dL — ABNORMAL HIGH (ref 0–99)
Triglycerides: 218 mg/dL — ABNORMAL HIGH (ref 0–149)
VLDL Cholesterol Cal: 44 mg/dL — ABNORMAL HIGH (ref 5–40)

## 2017-04-01 NOTE — Progress Notes (Signed)
Cardiology Office Note    Date:  04/02/2017   ID:  Lonnie Hansen, DOB 1951-01-23, MRN [id number]  PCP:  [name], MD  Cardiologist:  Dr. Martinique   Chief Complaint  Patient presents with  . Thoracic Aortic Aneurysm    History of Present Illness:  Lonnie Hansen is a 67 y.o. 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 In 2017 chest x-ray showed aortic root enlargement. He underwent CT that showed dilatation of proximal aorta. Descending aorta was normal. Echocardiogram obtained on 09/20/2015 showed EF 60-65%, moderate to severe AI, grade 1 DD. He was evaluated by Dr. Servando Snare. More recently, CT scan showed aortic root had enlarged to 5.2 cm. Given the size of aortic root and the degree of AI, it was recommended he undergo aortic root grafting and AVR. He underwent preoperative evaluation with cardiac catheterization on 04/14/2016 which showed moderate to severe AI, EF 45%, 80% stenosis in a nondominant RCA, 50% distal LAD. He eventually underwent the planned procedure with ascending aortic root replacement using 32 mm Gelweave Valsalva Graft and AVR using 29 mm Edwards Perimount Magna Ease aortic bioprosthesis valve on 09/15/2016 by Dr. Servando Snare.  Postoperatively, he did have some volume overload that responded well to diuretic. He also had postoperative thrombocytopenia as well. He was placed on oral amiodarone for postoperative atrial fibrillation.  When seen in follow up chest x-ray showed recurrent right pleural effusion.Echo on 10/09/2016 showed only a trivial amount of pericardial effusion, EF 60-65%. He underwent right thoracentesis again on 10/23/2016 with the removal 2 L of dark blood. Post procedure, he developed a right pneumothorax related to incomplete reexpansion of the right middle and lower lobe of the lung. He was admitted on 10/30/2016 for pigtail drain placement, last chest x-ray obtained on 11/04/2016 showed  improvement of right pleural effusion and resolution of right pneumothorax. Followed by Dr. Servando Snare with plans to repeat CT in April.   On follow up today he is doing well. He is active playing music. [facility] is [facility] in [facility] He denies any chest pain, SOB, palpitations, dizziness, or edema. He still has numbness in 3 lateral fingers on right hand. Notes some sinus drainage and cough in am. Wears CPAP.   Past Medical History:  Diagnosis Date  . Anemia   . Arthritis   . Asymptomatic bilateral carotid artery stenosis 08/2015   1-39%   . Blood dyscrasia    "trouble with my bloods clotting"  . Bruising    on the skin states due to platelets or sometimes low or high  . Cataracts, bilateral   . Coronary artery disease   . Diverticulosis   . Enlarged aorta ([place])   . Enlarged prostate    slightly  . GERD (gastroesophageal reflux disease)    takes Pantoprazole daily as needed  . Glaucoma    uses eye drops daily  . Headache   . History of colon polyps    benign  . History of kidney stones   . Hyperlipidemia    no on any meds  . Hypertension    takes Amlodipine and Atenolol daily  . Joint pain   . Joint swelling   . Nocturia   . Sleep apnea   . Vocal cord nodule    pt. states  it's a" growth on vocal cord"    Past Surgical  History:  Procedure Laterality Date  . AORTIC ARCH ANGIOGRAPHY N/A 04/16/2016   Procedure: Aortic Arch Angiography;  Surgeon: Hiral Lukasiewicz M Martinique, MD;  Location: [facility];  Service: Cardiovascular;  Laterality: N/A;  . AORTIC VALVE REPLACEMENT N/A 09/15/2016   Procedure: AORTIC VALVE REPLACEMENT (AVR);  Surgeon: Grace Isaac, MD;  Location: [facility];  Service: Open Heart Surgery;  Laterality: N/A;  Using 35mm Edwards Perimount Magna Ease Aortic Bioprosthesis Valve  . ASCENDING AORTIC ROOT REPLACEMENT N/A 09/15/2016   Procedure: ASCENDING AORTIC ROOT REPLACEMENT;  Surgeon: Grace Isaac, MD;  Location: [facility];  Service: Open Heart Surgery;  Laterality: N/A;  Using 55mm Gelweave Valsalva Graft  . COLONOSCOPY    . COLONOSCOPY WITH ESOPHAGOGASTRODUODENOSCOPY (EGD)    . IR THORACENTESIS ASP PLEURAL SPACE W/IMG GUIDE  10/23/2016  . MULTIPLE EXTRACTIONS WITH ALVEOLOPLASTY N/A 06/10/2016   Procedure: Extraction of tooth #'s 2,8,13,15, and 29  with alveoloplasty, maxillary right and left buccal exostoses reductions, and gross debridement of remaining teeth.;  Surgeon: Lenn Cal, DDS;  Location: [facility];  Service: Oral Surgery;  Laterality: N/A;  . RIGHT/LEFT HEART CATH AND CORONARY ANGIOGRAPHY N/A 04/16/2016   Procedure: Right/Left Heart Cath and Coronary Angiography;  Surgeon: Graci Hulce M Martinique, MD;  Location: [facility];  Service: Cardiovascular;  Laterality: N/A;  . TEE WITHOUT CARDIOVERSION N/A 09/15/2016   Procedure: TRANSESOPHAGEAL ECHOCARDIOGRAM (TEE);  Surgeon: Grace Isaac, MD;  Location: [facility];  Service: Open Heart Surgery;  Laterality: N/A;    Current Medications: Outpatient Medications Prior to Visit  Medication Sig Dispense Refill  . amLODipine (NORVASC) 10 MG tablet Take 10 mg by mouth daily.    Marland Kitchen aspirin EC 81 MG EC tablet Take 1 tablet (81 mg total) by mouth daily.    Marland Kitchen atenolol (TENORMIN) 25 MG tablet Take 0.5 tablets (12.5 mg total) by mouth 2 (two) times daily. 30 tablet 1  . Ferrous Sulfate (IRON) 325 (65 Fe) MG TABS Take 1 tablet by mouth daily.    . furosemide (LASIX) 20 MG tablet TAKE 1 TABLET BY MOUTH ONCE DAILY 90 tablet 3  . latanoprost (XALATAN) 0.005 % ophthalmic solution Place 1 drop into both eyes at bedtime.    . Multiple Vitamin (MULTIVITAMIN WITH MINERALS) TABS tablet Take 1 tablet by mouth daily.    . potassium chloride SA (K-DUR,KLOR-CON) 20 MEQ tablet Take 1 tablet (20 mEq total) daily by mouth. 90 tablet 3   No facility-administered medications prior to visit.      Allergies:   Patient has no known allergies.   Social History   Socioeconomic  History  . Marital status: Married    Spouse name: None  . Number of children: 1  . Years of education: None  . Highest education level: None  Social Needs  . Financial resource strain: None  . Food insecurity - worry: None  . Food insecurity - inability: None  . Transportation needs - medical: None  . Transportation needs - non-medical: None  Occupational History  . Occupation: Musician  Tobacco Use  . Smoking status: Former Smoker    Packs/day: 1.00    Years: 25.00    Pack years: 25.00    Types: Cigarettes  . Smokeless tobacco: Never Used  Substance and Sexual Activity  . Alcohol use: No    Alcohol/week: 0.0 oz  . Drug use: No  . Sexual activity: None  Other Topics Concern  . None  Social History Narrative  Married.   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     Family History:  The patient's family history includes Heart attack in his father; Heart disease in his father; Thyroid disease in his sister.   ROS:   Please see the history of present illness.    ROS All other systems reviewed and are negative.   PHYSICAL EXAM:   VS:  BP 122/78   Pulse 67   Ht 5' 7.5" (1.715 m)   Wt 194 lb 9.6 oz (88.3 kg)   BMI 30.03 kg/m    GENERAL:  Well appearing BM in NAD HEENT:  PERRL, EOMI, sclera are clear. Oropharynx is clear. NECK:  No jugular venous distention, carotid upstroke brisk and symmetric, no bruits, no thyromegaly or adenopathy LUNGS:  Clear to auscultation bilaterally CHEST:  Keloid in lower portion of sternal incision. HEART:  RRR,  PMI not displaced or sustained,S1 and S2 within normal limits, no S3, no S4: no clicks, no rubs, no murmurs ABD:  Soft, nontender. BS +, no masses or bruits. No hepatomegaly, no splenomegaly EXT:  2 + pulses throughout, no edema, no cyanosis no clubbing SKIN:  Warm and dry.  No rashes NEURO:  Alert and oriented x 3. Cranial nerves II through XII intact. PSYCH:  Cognitively intact    Wt Readings from Last 3 Encounters:    04/02/17 194 lb 9.6 oz (88.3 kg)  03/04/17 197 lb (89.4 kg)  03/03/17 194 lb 14.2 oz (88.4 kg)      Studies/Labs Reviewed:   EKG:  EKG is not ordered today.    Recent Labs: 09/16/2016: Magnesium 2.2 10/30/2016: B Natriuretic Peptide 138.4 11/04/2016: Hemoglobin 10.8; Platelets 120 03/31/2017: ALT 10; BUN 13; Creatinine, Ser 0.94; Potassium 3.7; Sodium 142   Lipid Panel    Component Value Date/Time   CHOL 186 03/31/2017 1535   TRIG 218 (H) 03/31/2017 1535   HDL 37 (L) 03/31/2017 1535   CHOLHDL 4.0 10/07/2016 0812   CHOLHDL 3.5 04/28/2010 0703   VLDL 14 04/28/2010 0703   LDLCALC 105 (H) 03/31/2017 1535    Additional studies/ records that were reviewed today include:   Echo 09/20/2015 LV EF: 60% - 65%  Study Conclusions  - Left ventricle: The cavity size was normal. There was moderate focal basal hypertrophy of the septum. Systolic function was normal. The estimated ejection fraction was in the range of 60% to 65%. Wall motion was normal; there were no regional wall motion abnormalities. Doppler parameters are consistent with abnormal left ventricular relaxation (grade 1 diastolic dysfunction). - Aortic valve: There was moderate to severe regurgitation. - Aorta: Aortic root dimension: 43 mm (ED). - Ascending aorta: The ascending aorta was mildly dilated. - Right ventricle: The cavity size was mildly dilated. Wall thickness was normal.    Cath 04/16/2016 Conclusion     Mid RCA lesion, 80 %stenosed.  Ost LAD to Prox LAD lesion, 25 %stenosed.  Dist LAD lesion, 50 %stenosed.  There is mild left ventricular systolic dysfunction.  LV end diastolic pressure is normal.  The left ventricular ejection fraction is 45-50% by visual estimate.  There is no mitral valve regurgitation.  There is no aortic valve stenosis.  LV end diastolic pressure is normal.  1. 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 Mild LV  dysfunction. EF 45%. 3. Ascending thoracic aortic aneurysm. 4. 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 5. Normal Right heart And LV filling pressures 6. Normal cardiac output.  Plan: Aortic root grafting and AVR.      Echo 09/16/2016 LV EF: 55% - 60%  Study Conclusions  - Left ventricle: The cavity size was normal. Wall thickness was increased in a pattern of severe LVH. Systolic function was normal. The estimated ejection fraction was in the range of 55% to 60%. - Aortic valve: Valve area (VTI): 4.29 cm^2. Valve area (Vmax): 4.15 cm^2. Valve area (Vmean): 3.88 cm^2. - Mitral valve: Severely calcified annulus. Valve area by continuity equation (using LVOT flow): 3.35 cm^2. - Pericardium, extracardiac: Localized moderate pericardial effusion posterior to the LA.    Limited Echo 10/09/2016 LV EF: 60% -   65%  Study Conclusions  - Left ventricle: The cavity size was normal. Wall thickness was   increased in a pattern of mild LVH. Systolic function was normal.   The estimated ejection fraction was in the range of 60% to 65%. - Aortic valve: AV prosthesis appears to open well Peak and mean   gradients through the valve are 9 and 5 mm Hg respectively. - Mitral valve: Calcified annulus. Mildly thickened leaflets .   There was mild regurgitation. - Pulmonary arteries: PA peak pressure: 37 mm Hg (S). - Pericardium, extracardiac: A trivial pericardial effusion was   identified.   R thoracentesis 10/23/2016 Post CXR: IMPRESSION: Resolution of right-sided pleural effusion. Right pneumothorax is noted related to incomplete re-expansion of the right middle and lower lobes. This does not represent a true pneumothorax. Continued follow-up is recommended.    ASSESSMENT:    1. S/P AVR (aortic valve replacement)   2. S/P aortic aneurysm repair   3. Coronary artery disease involving native coronary artery of native heart without angina pectoris   4. H/O aortic root  repair   5. Essential hypertension   6. Hyperlipidemia, unspecified hyperlipidemia type      PLAN:  In order of problems listed above:  1. H/o Aortic root repair and AVR: clinically he has made a complete recovery.  Repeat Echo in September showed good prosthetic valve function. Repeat CT in April.   2. Recurrent right pleural effusion- resolved.   3. CAD: No anginal pain, 80% mid nondominant RCA and a 50% distal LAD lesion on previous cardiac catheterization.  Continue medical therapy.   4. Hypertension: Blood pressure is well controlled.   5. Hyperlipidemia: currently not on a statin. Just quit taking. Discussed importance of statin therapy to reduce risk of future CV events. Will start Crestor 10 mg daily and repeat lab in 3 months.      Medication Adjustments/Labs and Tests Ordered: Current medicines are reviewed at length with the patient today.  Concerns regarding medicines are outlined above.  Medication changes, Labs and Tests ordered today are listed in the Patient Instructions below. Patient Instructions  Start taking Crestor 10 mg daily for cholesterol   We will repeat labs in 3 months.  I will see you back in 6 months     Signed, Lonnie Monteforte Martinique, MD  04/02/2017 1:15 PM    [facility] [place], [place],   [address] Phone: [phone]; Fax: [phone]

## 2017-04-02 ENCOUNTER — Encounter: Payer: Self-pay | Admitting: Cardiology

## 2017-04-02 ENCOUNTER — Encounter (HOSPITAL_COMMUNITY): Payer: Self-pay

## 2017-04-02 ENCOUNTER — Ambulatory Visit (INDEPENDENT_AMBULATORY_CARE_PROVIDER_SITE_OTHER): Payer: Medicare Other | Admitting: Cardiology

## 2017-04-02 VITALS — BP 122/78 | HR 67 | Ht 67.5 in | Wt 194.6 lb

## 2017-04-02 DIAGNOSIS — Z8679 Personal history of other diseases of the circulatory system: Secondary | ICD-10-CM | POA: Diagnosis not present

## 2017-04-02 DIAGNOSIS — Z9889 Other specified postprocedural states: Secondary | ICD-10-CM | POA: Diagnosis not present

## 2017-04-02 DIAGNOSIS — E785 Hyperlipidemia, unspecified: Secondary | ICD-10-CM

## 2017-04-02 DIAGNOSIS — I2510 Atherosclerotic heart disease of native coronary artery without angina pectoris: Secondary | ICD-10-CM

## 2017-04-02 DIAGNOSIS — Z952 Presence of prosthetic heart valve: Secondary | ICD-10-CM | POA: Diagnosis not present

## 2017-04-02 DIAGNOSIS — I10 Essential (primary) hypertension: Secondary | ICD-10-CM | POA: Diagnosis not present

## 2017-04-02 MED ORDER — ROSUVASTATIN CALCIUM 10 MG PO TABS: 10.0000 mg | ORAL_TABLET | Freq: Every day | ORAL | 3 refills | Status: DC

## 2017-04-02 NOTE — Patient Instructions (Signed)
Start taking Crestor 10 mg daily for cholesterol   We will repeat labs in 3 months.  I will see you back in 6 months

## 2017-04-05 ENCOUNTER — Encounter (HOSPITAL_COMMUNITY)
Admission: RE | Admit: 2017-04-05 | Discharge: 2017-04-05 | Disposition: A | Discharge status: Home / Self Care | Payer: Self-pay | Source: Ambulatory Visit | Attending: Cardiology | Admitting: Cardiology

## 2017-04-07 ENCOUNTER — Encounter (HOSPITAL_COMMUNITY)
Admission: RE | Admit: 2017-04-07 | Discharge: 2017-04-07 | Disposition: A | Discharge status: Home / Self Care | Payer: Self-pay | Source: Ambulatory Visit | Attending: Cardiology | Admitting: Cardiology

## 2017-04-09 ENCOUNTER — Encounter (HOSPITAL_COMMUNITY)
Admission: RE | Admit: 2017-04-09 | Discharge: 2017-04-09 | Disposition: A | Discharge status: Home / Self Care | Payer: Self-pay | Source: Ambulatory Visit | Attending: Cardiology | Admitting: Cardiology

## 2017-04-12 ENCOUNTER — Encounter (HOSPITAL_COMMUNITY)
Admission: RE | Admit: 2017-04-12 | Discharge: 2017-04-12 | Disposition: A | Discharge status: Home / Self Care | Payer: Self-pay | Source: Ambulatory Visit | Attending: Cardiology | Admitting: Cardiology

## 2017-04-14 ENCOUNTER — Encounter (HOSPITAL_COMMUNITY)
Admission: RE | Admit: 2017-04-14 | Discharge: 2017-04-14 | Disposition: A | Discharge status: Home / Self Care | Payer: Self-pay | Source: Ambulatory Visit | Attending: Cardiology | Admitting: Cardiology

## 2017-04-15 ENCOUNTER — Other Ambulatory Visit: Payer: Self-pay | Admitting: *Deleted

## 2017-04-15 DIAGNOSIS — I712 Thoracic aortic aneurysm, without rupture, unspecified: Secondary | ICD-10-CM

## 2017-04-15 DIAGNOSIS — Z952 Presence of prosthetic heart valve: Secondary | ICD-10-CM

## 2017-04-16 ENCOUNTER — Encounter (HOSPITAL_COMMUNITY)
Admission: RE | Admit: 2017-04-16 | Discharge: 2017-04-16 | Disposition: A | Discharge status: Home / Self Care | Payer: Self-pay | Source: Ambulatory Visit | Attending: Cardiology | Admitting: Cardiology

## 2017-04-19 ENCOUNTER — Encounter (HOSPITAL_COMMUNITY)
Admission: RE | Admit: 2017-04-19 | Discharge: 2017-04-19 | Disposition: A | Discharge status: Home / Self Care | Payer: Self-pay | Source: Ambulatory Visit | Attending: Cardiology | Admitting: Cardiology

## 2017-04-20 DIAGNOSIS — I10 Essential (primary) hypertension: Secondary | ICD-10-CM | POA: Diagnosis not present

## 2017-04-20 DIAGNOSIS — Z6829 Body mass index (BMI) 29.0-29.9, adult: Secondary | ICD-10-CM | POA: Diagnosis not present

## 2017-04-20 DIAGNOSIS — Z1389 Encounter for screening for other disorder: Secondary | ICD-10-CM | POA: Diagnosis not present

## 2017-04-20 DIAGNOSIS — L723 Sebaceous cyst: Secondary | ICD-10-CM | POA: Diagnosis not present

## 2017-04-20 DIAGNOSIS — I2510 Atherosclerotic heart disease of native coronary artery without angina pectoris: Secondary | ICD-10-CM | POA: Diagnosis not present

## 2017-04-20 DIAGNOSIS — R05 Cough: Secondary | ICD-10-CM | POA: Diagnosis not present

## 2017-04-20 DIAGNOSIS — G4733 Obstructive sleep apnea (adult) (pediatric): Secondary | ICD-10-CM | POA: Diagnosis not present

## 2017-04-21 ENCOUNTER — Encounter (HOSPITAL_COMMUNITY)
Admission: RE | Admit: 2017-04-21 | Discharge: 2017-04-21 | Disposition: A | Discharge status: Home / Self Care | Payer: Self-pay | Source: Ambulatory Visit | Attending: Cardiology | Admitting: Cardiology

## 2017-04-23 ENCOUNTER — Encounter (HOSPITAL_COMMUNITY)
Admission: RE | Admit: 2017-04-23 | Discharge: 2017-04-23 | Disposition: A | Discharge status: Home / Self Care | Payer: Self-pay | Source: Ambulatory Visit | Attending: Cardiology | Admitting: Cardiology

## 2017-04-27 ENCOUNTER — Ambulatory Visit: Payer: Self-pay | Admitting: Cardiothoracic Surgery

## 2017-04-27 DIAGNOSIS — R1011 Right upper quadrant pain: Secondary | ICD-10-CM | POA: Diagnosis not present

## 2017-04-27 DIAGNOSIS — N390 Urinary tract infection, site not specified: Secondary | ICD-10-CM | POA: Diagnosis not present

## 2017-04-27 DIAGNOSIS — R5383 Other fatigue: Secondary | ICD-10-CM | POA: Diagnosis not present

## 2017-04-27 DIAGNOSIS — Z6829 Body mass index (BMI) 29.0-29.9, adult: Secondary | ICD-10-CM | POA: Diagnosis not present

## 2017-04-27 DIAGNOSIS — R109 Unspecified abdominal pain: Secondary | ICD-10-CM | POA: Diagnosis not present

## 2017-04-27 DIAGNOSIS — R197 Diarrhea, unspecified: Secondary | ICD-10-CM | POA: Diagnosis not present

## 2017-04-27 NOTE — Addendum Note (Signed)
Encounter addended by: Lowell Guitar, RN on: 04/27/2017 3:42 PM  Actions taken: Sign clinical note

## 2017-04-27 NOTE — Progress Notes (Signed)
Discharge Progress Report  Patient Details  Name: Lonnie Hansen MRN: [id number] Date of Birth: 07/19/1950 Referring Provider:   Flowsheet Row CARDIAC REHAB PHASE II ORIENTATION from 12/03/2016 in [facility]  Referring Provider  Jordan,Peter MD       Number of Visits: 36   Reason for Discharge:  Patient has met program and personal goals.  Smoking History:  Social History   Tobacco Use  Smoking Status Former Smoker  . Packs/day: 1.00  . Years: 25.00  . Pack years: 25.00  . Types: Cigarettes  Smokeless Tobacco Never Used    Diagnosis:  S/P AVR (aortic valve replacement)  ADL [facility]:   Initial Exercise Prescription: Initial Exercise Prescription - 12/03/16 1600    Date of Initial Exercise RX and Referring Provider          Date  12/03/16    Referring Provider  Jordan,Peter MD        Recumbant Bike          Level  2    Minutes  10    METs  1.5        NuStep          Level  2    SPM  70    Minutes  10    METs  2        Arm Ergometer          Level  1    Minutes  10    METs  1        Prescription Details          Frequency (times per week)  3    Duration  Progress to 30 minutes of continuous aerobic without signs/symptoms of physical distress        Intensity          THRR 40-80% of Max Heartrate  61-122    Ratings of Perceived Exertion  11-13    Perceived Dyspnea  0-4        Progression          Progression  Continue to progress workloads to maintain intensity without signs/symptoms of physical distress.        Resistance Training          Training Prescription  Yes    Weight  2lbs    Reps  10-15           Discharge Exercise Prescription (Final Exercise Prescription Changes): Exercise Prescription Changes - 03/16/17 1400    Response to Exercise          Blood Pressure (Admit)  118/60    Blood Pressure (Exercise)  120/62    Blood Pressure (Exit)  124/82    Heart Rate (Admit)  78 bpm    Heart  Rate (Exercise)  85 bpm    Heart Rate (Exit)  52 bpm    Perceived Dyspnea (Exercise)  12    Duration  Progress to 30 minutes of  aerobic without signs/symptoms of physical distress    Intensity  THRR unchanged        Progression          Progression  Continue to progress workloads to maintain intensity without signs/symptoms of physical distress.    Average METs  2.4        Resistance Training          Training Prescription  Yes    Weight  3lbs    Reps  10-15      Time  10 Minutes        Interval Training          Interval Training  No        NuStep          Level  4    SPM  70    Minutes  15    METs  2.4        Arm Ergometer          Level  1    Minutes  15    METs  2.48        Home Exercise Plan          Plans to continue exercise at  Home (comment) Walking     Frequency  Add 2 additional days to program exercise sessions.    Initial Home Exercises Provided  12/23/16           Functional Capacity: 6 Minute Walk    6 Minute Walk    Row Name 12/03/16 1352 12/03/16 1603 03/03/17 1527   Phase  Initial  Initial  Discharge   Distance  no documentation  1006 feet  1150 feet   Walk Time  no documentation  6 minutes  6 minutes   # of Rest Breaks  no documentation  0  0   MPH  no documentation  1.91  2.2   METS  no documentation  2.48  2.5   RPE  no documentation  13  14   VO2 Peak  no documentation  8.67  no documentation   Symptoms  no documentation  Yes (comment)  Yes (comment)   Comments  no documentation  L hip pain 3/10  R hip pain   Resting HR  no documentation  89 bpm  69 bpm   Resting BP  no documentation  122/80  126/82   Resting Oxygen Saturation   no documentation  97 %  no documentation   Exercise Oxygen Saturation  during 6 min walk  no documentation  98 %  no documentation   Max Ex. HR  no documentation  107 bpm  89 bpm   Max Ex. BP  no documentation  118/72  130/80   2 Minute Post BP  no documentation  no documentation  110/62       6  Minute Walk    Row Name 03/03/17 1529 04/09/17 1606   Distance % Change  14.31 %  (Pended)   14.31 %   Distance Feet Change  144 ft  (Pended)   144 ft          Psychological, QOL, Others - Outcomes: PHQ 2/9: Depression screen [facility] 2/9 03/15/2017 12/07/2016  Decreased Interest 0 0  Down, Depressed, Hopeless 0 0  PHQ - 2 Score 0 0    Quality of Life: Quality of Life - 04/09/17 1556    Quality of Life Scores          Health/Function Pre  20.57 %    Health/Function Post  24.07 %    Health/Function % Change  17.02 %    Socioeconomic Pre  23.94 %    Socioeconomic Post  27.43 %    Socioeconomic % Change   14.58 %    Psych/Spiritual Pre  25.86 %    Psych/Spiritual Post  27.43 %    Psych/Spiritual % Change  6.07 %    Family Pre  30 %    Family Post  27.6 %  Family % Change  -8 %    GLOBAL Pre  23.84 %    GLOBAL Post  25.97 %    GLOBAL % Change  8.93 %           Personal Goals: Goals established at orientation with interventions provided to work toward goal. Personal Goals and Risk Factors at Admission - 12/03/16 1610    Core Components/Risk Factors/Patient Goals on Admission           Weight Management  Yes;Weight Maintenance;Weight Loss    Intervention  Weight Management: Provide education and appropriate resources to help participant work on and attain dietary goals.;Weight Management: Develop a combined nutrition and exercise program designed to reach desired caloric intake, while maintaining appropriate intake of nutrient and fiber, sodium and fats, and appropriate energy expenditure required for the weight goal.;Weight Management/Obesity: Establish reasonable short term and long term weight goals.    Admit Weight  186 lb 15.2 oz (84.8 kg)    Goal Weight: Short Term  180 lb (81.6 kg)    Goal Weight: Long Term  175 lb (79.4 kg)    Expected Outcomes  Short Term: Continue to assess and modify interventions until short term weight is achieved;Long Term: Adherence to  nutrition and physical activity/exercise program aimed toward attainment of established weight goal;Weight Maintenance: Understanding of the daily nutrition guidelines, which includes 25-35% calories from fat, 7% or less cal from saturated fats, less than 200mg cholesterol, less than 1.5gm of sodium, & 5 or more servings of fruits and vegetables daily;Weight Loss: Understanding of general recommendations for a balanced deficit meal plan, which promotes 1-2 lb weight loss per week and includes a negative energy balance of 500-1000 kcal/d;Understanding recommendations for meals to include 15-35% energy as protein, 25-35% energy from fat, 35-60% energy from carbohydrates, less than 200mg of dietary cholesterol, 20-35 gm of total fiber daily;Understanding of distribution of calorie intake throughout the day with the consumption of 4-5 meals/snacks    Hypertension  Yes    Intervention  Provide education on lifestyle modifcations including regular physical activity/exercise, weight management, moderate sodium restriction and increased consumption of fresh fruit, vegetables, and low fat dairy, alcohol moderation, and smoking cessation.;Monitor prescription use compliance.    Expected Outcomes  Short Term: Continued assessment and intervention until BP is < 140/90mm HG in hypertensive participants. < 130/80mm HG in hypertensive participants with diabetes, heart failure or chronic kidney disease.;Long Term: Maintenance of blood pressure at goal levels.    Lipids  Yes    Intervention  Provide education and support for participant on nutrition & aerobic/resistive exercise along with prescribed medications to achieve LDL <70mg, HDL >40mg.    Expected Outcomes  Short Term: Participant states understanding of desired cholesterol values and is compliant with medications prescribed. Participant is following exercise prescription and nutrition guidelines.;Long Term: Cholesterol controlled with medications as prescribed, with  individualized exercise RX and with personalized nutrition plan. Value goals: LDL < 70mg, HDL > 40 mg.            Personal Goals Discharge: Goals and Risk Factor Review    Core Components/Risk Factors/Patient Goals Review    Row Name 12/25/16 1658 01/22/17 1626 02/12/17 1643 03/15/17 1606   Personal Goals Review  Weight Management/Obesity;Hypertension;Lipids  Weight Management/Obesity;Hypertension;Lipids  Weight Management/Obesity;Hypertension;Lipids  Weight Management/Obesity;Hypertension;Lipids   Review  pt with multiple CAD RF demonstrates eagerness to participate in CR opportunities.   pt with multiple CAD RF demonstrates eagerness to participate in CR opportunities. pt demonstrates compliance   with hypertension and lipid management regimen.   pt with multiple CAD RF demonstrates eagerness to participate in CR opportunities. pt demonstrates compliance with hypertension and lipid management regimen. pt notes increased energy, muscle mass and vocal cord strength.    pt completed CR program with 36 sessions.  pt demonstrates compliance with hypertension and lipid management regimen. pt notes increased energy, muscle mass and vocal cord strength.  pt is pleased with friendships and nutrition skills he has learned in CR. pt plans to continue exercising in cardiac maintenance program.       Expected Outcomes  pt will participate in CR exercise, nutrition and lifestyle modification opportunities to decrease overall RF.    pt will participate in CR exercise, nutrition and lifestyle modification opportunities to decrease overall RF.    pt will participate in CR exercise, nutrition and lifestyle modification opportunities to decrease overall RF.    pt will participate in exercise, nutrition and lifestyle modification opportunities to decrease overall RF.            Exercise Goals and Review: Exercise Goals    Exercise Goals    Row Name 12/03/16 1415   Increase Physical Activity  Yes   Intervention   Provide advice, education, support and counseling about physical activity/exercise needs.;Develop an individualized exercise prescription for aerobic and resistive training based on initial evaluation findings, risk stratification, comorbidities and participant's personal goals.   Expected Outcomes  Achievement of increased cardiorespiratory fitness and enhanced flexibility, muscular endurance and strength shown through measurements of functional capacity and personal statement of participant.   Increase Strength and Stamina  Yes   Intervention  Provide advice, education, support and counseling about physical activity/exercise needs.;Develop an individualized exercise prescription for aerobic and resistive training based on initial evaluation findings, risk stratification, comorbidities and participant's personal goals.   Expected Outcomes  Achievement of increased cardiorespiratory fitness and enhanced flexibility, muscular endurance and strength shown through measurements of functional capacity and personal statement of participant.   Able to understand and use rate of perceived exertion (RPE) scale  Yes   Intervention  Provide education and explanation on how to use RPE scale   Expected Outcomes  Long Term:  Able to use RPE to guide intensity level when exercising independently;Short Term: Able to use RPE daily in rehab to express subjective intensity level   Knowledge and understanding of Target Heart Rate Range (THRR)  Yes   Intervention  Provide education and explanation of THRR including how the numbers were predicted and where they are located for reference   Expected Outcomes  Long Term: Able to use THRR to govern intensity when exercising independently;Short Term: Able to use daily as guideline for intensity in rehab;Short Term: Able to state/look up THRR   Able to check pulse independently  Yes   Intervention  Provide education and demonstration on how to check pulse in carotid and radial  arteries.;Review the importance of being able to check your own pulse for safety during independent exercise   Expected Outcomes  Short Term: Able to explain why pulse checking is important during independent exercise;Long Term: Able to check pulse independently and accurately   Understanding of Exercise Prescription  Yes   Intervention  Provide education, explanation, and written materials on patient's individual exercise prescription   Expected Outcomes  Short Term: Able to explain program exercise prescription;Long Term: Able to explain home exercise prescription to exercise independently          Nutrition & Weight - Outcomes:  Pre Biometrics - 12/03/16 1608    Pre Biometrics          Waist Circumference  39.5 inches    Hip Circumference  43 inches    Waist to Hip Ratio  0.92 %    Triceps Skinfold  22 mm    % Body Fat  29.3 %    Grip Strength  24 kg    Flexibility  8 in    Single Leg Stand  17 seconds          Post Biometrics - 03/03/17 1528     Post  Biometrics          Height  5' 7.5" (1.715 m)    Weight  194 lb 14.2 oz (88.4 kg)    Waist Circumference  40.5 inches    Hip Circumference  44 inches    Waist to Hip Ratio  0.92 %    BMI (Calculated)  30.06    Triceps Skinfold  22 mm    % Body Fat  30.3 %    Grip Strength  33 kg    Flexibility  12 in    Single Leg Stand  12.56 seconds           Nutrition: Nutrition Therapy & Goals - 12/04/16 1422    Nutrition Therapy          Diet  Therapeutic Lifestyle Changes        Personal Nutrition Goals          Nutrition Goal  Pt to identify food quantities necessary to achieve weight loss of 6-11 lb at graduation from cardiac rehab. Goal wt of 175 lb desired.         Intervention Plan          Intervention  Prescribe, educate and counsel regarding individualized specific dietary modifications aiming towards targeted core components such as weight, hypertension, lipid management, diabetes, heart failure and other  comorbidities.    Expected Outcomes  Short Term Goal: Understand basic principles of dietary content, such as calories, fat, sodium, cholesterol and nutrients.;Long Term Goal: Adherence to prescribed nutrition plan.           Nutrition Discharge: Nutrition Assessments - 04/16/17 0947    MEDFICTS Scores          Pre Score  64    Post Score  44    Score Difference  -20           Education Questionnaire Score: Knowledge Questionnaire Score - 04/09/17 1556    Knowledge Questionnaire Score          Post Score  22/24           Goals reviewed with patient; copy given to patient. 

## 2017-04-27 NOTE — Addendum Note (Signed)
Encounter addended by: Lowell Guitar, RN on: 04/27/2017 3:43 PM  Actions taken: Episode resolved

## 2017-05-03 DIAGNOSIS — L218 Other seborrheic dermatitis: Secondary | ICD-10-CM | POA: Diagnosis not present

## 2017-05-03 DIAGNOSIS — L720 Epidermal cyst: Secondary | ICD-10-CM | POA: Diagnosis not present

## 2017-05-04 ENCOUNTER — Other Ambulatory Visit: Payer: Self-pay

## 2017-05-04 ENCOUNTER — Ambulatory Visit (INDEPENDENT_AMBULATORY_CARE_PROVIDER_SITE_OTHER): Payer: Medicare Other | Admitting: Cardiothoracic Surgery

## 2017-05-04 ENCOUNTER — Ambulatory Visit
Admission: RE | Admit: 2017-05-04 | Discharge: 2017-05-04 | Disposition: A | Discharge status: Home / Self Care | Payer: Medicare Other | Source: Ambulatory Visit | Attending: Cardiothoracic Surgery | Admitting: Cardiothoracic Surgery

## 2017-05-04 ENCOUNTER — Encounter: Payer: Self-pay | Admitting: Cardiothoracic Surgery

## 2017-05-04 VITALS — BP 120/80 | HR 61 | Ht 67.5 in | Wt 190.0 lb

## 2017-05-04 DIAGNOSIS — I712 Thoracic aortic aneurysm, without rupture, unspecified: Secondary | ICD-10-CM

## 2017-05-04 DIAGNOSIS — Z952 Presence of prosthetic heart valve: Secondary | ICD-10-CM

## 2017-05-04 DIAGNOSIS — I2510 Atherosclerotic heart disease of native coronary artery without angina pectoris: Secondary | ICD-10-CM | POA: Diagnosis not present

## 2017-05-04 DIAGNOSIS — Z954 Presence of other heart-valve replacement: Secondary | ICD-10-CM | POA: Diagnosis not present

## 2017-05-04 MED ORDER — IOPAMIDOL (ISOVUE-370) INJECTION 76%
75.0000 mL | Freq: Once | INTRAVENOUS | Status: AC | PRN
  Administered 2017-05-04: 75 mL via INTRAVENOUS

## 2017-05-04 NOTE — Progress Notes (Signed)
[address]       [place], [address]             [phone]      Lonnie Hansen  Medical Record #[id number] Date of Birth: Aug 08, 1950  Referring: Lorretta Harp, MD Primary Care: [name], MD Primary Cardiology: No primary care provider on file.   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 PROCEDURES:  Biologic Bentall with replacement of aortic valve with [employer] pericardial tissue valve, model 3300TFX 29 mm, serial #[id number], and replacement of the aortic root and ascending aorta with a 32 mm Gelweave Valsalva graft and reimplantation of the right and left coronary buttons. SURGEON: Grace Isaac MD  History of Present Illness:      Patient continues to slowly improve, he still slowed by chronic arthritis.  He denies signs or symptoms of congestive heart failure or angina     Past Medical History:  Diagnosis Date  . Anemia   . Arthritis   . Asymptomatic bilateral carotid artery stenosis 08/2015   1-39%   . Blood dyscrasia    "trouble with my bloods clotting"  . Bruising    on the skin states due to platelets or sometimes low or high  . Cataracts, bilateral   . Coronary artery disease   . Diverticulosis   . Enlarged aorta ([place])   . Enlarged prostate    slightly  . GERD (gastroesophageal reflux disease)    takes Pantoprazole daily as needed  . Glaucoma    uses eye drops daily  . Headache   . History of colon polyps    benign  . History of kidney stones   . Hyperlipidemia    no on any meds  . Hypertension    takes Amlodipine and Atenolol daily  . Joint pain   . Joint swelling   . Nocturia   . Sleep apnea   . Vocal cord nodule    pt. states  it's a" growth on vocal cord"     Social History     Tobacco Use  Smoking Status Former Smoker  . Packs/day: 1.00  . Years: 25.00  . Pack years: 25.00  . Types: Cigarettes  Smokeless Tobacco Never Used    Social History   Substance and Sexual Activity  Alcohol Use No  . Alcohol/week: 0.0 oz     No Known Allergies  Current Outpatient Medications  Medication Sig Dispense Refill  . amLODipine (NORVASC) 10 MG tablet Take 10 mg by mouth daily.    Marland Kitchen aspirin EC 81 MG EC tablet Take 1 tablet (81 mg total) by mouth daily.    Marland Kitchen atenolol (TENORMIN) 25 MG tablet Take 0.5 tablets (12.5 mg total) by mouth 2 (two) times daily. 30 tablet 1  . furosemide (LASIX) 20 MG tablet TAKE 1 TABLET BY MOUTH ONCE DAILY 90 tablet 3  . latanoprost (XALATAN) 0.005 % ophthalmic solution Place 1 drop into both eyes at bedtime.    . Multiple Vitamin (MULTIVITAMIN WITH MINERALS) TABS tablet Take 1 tablet by mouth daily.    . rosuvastatin (CRESTOR) 10 MG tablet Take 1 tablet (10 mg total) by mouth daily. 90 tablet 3  . Ferrous Sulfate (IRON) 325 (65 Fe) MG TABS Take 1 tablet by mouth daily.  No current facility-administered medications for this visit.        Physical Exam: BP 120/80 (BP Location: Left Arm, Patient Position: Sitting, Cuff Size: Large)   Pulse 61   Ht 5' 7.5" (1.715 m)   Wt 190 lb (86.2 kg)   SpO2 98% Comment: RA  BMI 29.32 kg/m  General appearance: alert, cooperative, appears stated age and no distress Head: Normocephalic, without obvious abnormality, atraumatic Resp: clear to auscultation bilaterally Cardio: regular rate and rhythm, S1, S2 normal, no murmur, click, rub or gallop GI: soft, non-tender; bowel sounds normal; no masses,  no organomegaly Extremities: extremities normal, atraumatic, no cyanosis or edema and Homans sign is negative, no sign of DVT Neurologic: Grossly normal   Diagnostic Studies & Laboratory data:     Recent Radiology Findings:   Ct Angio Chest Aorta W &/or Wo Contrast  Result Date:  05/04/2017 CLINICAL DATA:  Follow-up aortic valve repair EXAM: CT ANGIOGRAPHY CHEST WITH CONTRAST TECHNIQUE: Multidetector CT imaging of the chest was performed using the standard protocol during bolus administration of intravenous contrast. Multiplanar CT image reconstructions and MIPs were obtained to evaluate the vascular anatomy. CONTRAST:  47mL ISOVUE-370 IOPAMIDOL (ISOVUE-370) INJECTION 76% COMPARISON:  03/26/2016, 01/30/2014 FINDINGS: Cardiovascular: There are changes consistent with aortic valve replacement. Coronary calcifications are noted. Aortic root repair is noted as well. At the level of the sinus of Valsalva aorta measures approximately 4.2 cm. The ascending component is widely patent consistent with the repair. The thoracic arch and descending thoracic aorta show atherosclerotic change. A focal outpouching associated with some soft atherosclerotic plaque is noted best seen on image number 96 of series 16 and image number 47 of series 7. This is slightly more prominent than on the previous preoperative exam. No dissection is noted. No significant cardiac enlargement is seen. Mediastinum/Nodes: Thoracic inlet is within normal limits. No significant hilar or mediastinal adenopathy is noted. The esophagus is within normal limits. Lungs/Pleura: The lungs are well aerated bilaterally. No focal infiltrate or sizable effusion is seen. No parenchymal nodules are noted. Upper Abdomen: Visualized upper abdomen demonstrates a large right parapelvic cyst as well as a large lamellated gallstone. Stable partially calcified partially cystic lesion in the left kidney is again seen similar to that noted in 2016. Musculoskeletal: Mild degenerative changes of the thoracic spine are seen. No acute bony abnormality is noted. Prior median sternotomy is seen. Review of the MIP images confirms the above findings. IMPRESSION: Status post aortic valve replacement and ascending aortic repair without complicating factors. Focal  mild outpouching associated with some soft atherosclerotic plaque in the proximal descending thoracic aorta. This is slightly more prominent than that seen on the prior exam. Attention on follow-up examinations is recommended. No true dissection is seen. Stable changes in the upper abdomen without complication. Electronically Signed   By: Inez Catalina M.D.   On: 05/04/2017 13:49    I have independently reviewed the above radiology studies  and reviewed the findings with the patient.    Recent Lab Findings: Lab Results  Component Value Date   WBC 6.9 11/04/2016   HGB 10.8 (L) 11/04/2016   HCT 35.8 (L) 11/04/2016   PLT 120 (L) 11/04/2016   GLUCOSE 85 03/31/2017   CHOL 186 03/31/2017   TRIG 218 (H) 03/31/2017   HDL 37 (L) 03/31/2017   LDLCALC 105 (H) 03/31/2017   ALT 10 03/31/2017   AST 22 03/31/2017   NA 142 03/31/2017   K 3.7 03/31/2017   CL 101 03/31/2017  CREATININE 0.94 03/31/2017   BUN 13 03/31/2017   CO2 24 03/31/2017   TSH 1.611 04/28/2010   INR 1.15 10/30/2016   HGBA1C 5.3 09/11/2016   Result status: Final result                           *[facility]*                        [address], [place] [address]                            [phone]  ------------------------------------------------------------------- Transthoracic Echocardiography  Patient:    Lonnie Hansen, Lonnie Hansen MR #:       [id number] Study Date: 10/09/2016 Gender:     M Age:        89 Height:     175.3 cm Weight:     90.1 kg BSA:        2.12 m^2 Pt. Status: Room:   SONOGRAPHER  Diamond Nickel  PERFORMING   [place], [facility]  Joanna Hews [phone]  [place] [phone]  [place], MD  cc:  ------------------------------------------------------------------- LV EF: 60% -   65%  ------------------------------------------------------------------- Indications:      I31.3 Pericardial  effusion.  ------------------------------------------------------------------- History:   PMH:  Aortic valve replacement. Aortic root dilatation. Sleep apnea.  Syncope.  Coronary artery disease.  Risk factors: Hypertension. Dyslipidemia.  ------------------------------------------------------------------- Study Conclusions  - Left ventricle: The cavity size was normal. Wall thickness was   increased in a pattern of mild LVH. Systolic function was normal.   The estimated ejection fraction was in the range of 60% to 65%. - Aortic valve: AV prosthesis appears to open well Peak and mean   gradients through the valve are 9 and 5 mm Hg respectively. - Mitral valve: Calcified annulus. Mildly thickened leaflets .   There was mild regurgitation. - Pulmonary arteries: PA peak pressure: 37 mm Hg (S). - Pericardium, extracardiac: A trivial pericardial effusion was   identified.  ------------------------------------------------------------------- Study data:  Comparison was made to the study of 09/16/2016.  Study status:  Routine.  Procedure:  Transthoracic echocardiography. Image quality was adequate.  Study completion:  There were no complications.          Transthoracic echocardiography.  M-mode, complete 2D, spectral Doppler, and color Doppler.  Birthdate: Patient birthdate: 02/14/1950.  Age:  Patient is 67 yr old.  Sex: Gender: male.    BMI: 29.3 kg/m^2.  Blood pressure:     114/81 Patient status:  Outpatient.  Study date:  Study date: 10/09/2016. Study time: 01:03 PM.  Location:  [facility]  -------------------------------------------------------------------  ------------------------------------------------------------------- Left ventricle:  Difficult acoustic windows LVEF is normal at approximately 60 % with mossible mild distal inferior hypokeinesis. The cavity size was normal. Wall thickness was increased in a pattern of mild LVH. Systolic function was normal. The  estimated ejection fraction was in the range of 60% to 65%.  ------------------------------------------------------------------- Aortic valve:  AV prosthesis appears to open well Peak and mean gradients through the valve are 9 and 5 mm Hg respectively.  Mildly thickened leaflets.  Doppler:  There was no regurgitation.  VTI ratio of LVOT to aortic valve: 0.47. Peak velocity ratio of LVOT to aortic valve: 0.47. Mean velocity ratio of LVOT to aortic valve: 0.46.    Mean gradient (S): 5 mm Hg. Peak gradient (S): 9 mm Hg.   ------------------------------------------------------------------- Aorta:  Aortic root dilated at 43 mm Ascending aorta is dilated at 45 mm.  ------------------------------------------------------------------- Mitral valve:   Calcified annulus. Mildly thickened leaflets . Leaflet separation was normal.  Doppler:  Transvalvular velocity was within the normal range. There was no evidence for stenosis. There was mild regurgitation.    Peak gradient (D): 2 mm Hg.  ------------------------------------------------------------------- Left atrium:  The atrium was normal in size.  ------------------------------------------------------------------- Right ventricle:  The cavity size was normal. Wall thickness was normal. Systolic function was normal.  ------------------------------------------------------------------- Pulmonic valve:    Structurally normal valve.   Cusp separation was normal.  Doppler:  Transvalvular velocity was within the normal range. There was no regurgitation.  ------------------------------------------------------------------- Tricuspid valve:   Structurally normal valve.   Leaflet separation was normal.  Doppler:  Transvalvular velocity was within the normal range. There was trivial regurgitation.  ------------------------------------------------------------------- Right atrium:  The atrium was normal in  size.  ------------------------------------------------------------------- Pericardium:  A trivial pericardial effusion was identified.  ------------------------------------------------------------------- Systemic veins: Inferior vena cava: The vessel was normal in size. The respirophasic diameter changes were in the normal range (= 50%), consistent with normal central venous pressure.  ------------------------------------------------------------------- Post procedure conclusions Ascending Aorta:  - Aortic root dilated at 43 mm Ascending aorta is dilated at 45 mm.  ------------------------------------------------------------------- Measurements   Left ventricle                         Value        Reference  LV ID, ED, PLAX chordal                46.1  mm     43 - 52  LV ID, ES, PLAX chordal                37.2  mm     23 - 38  LV fx shortening, PLAX chordal (L)     19    %      >=29  LV PW thickness, ED                    14.7  mm     ---------  IVS/LV PW ratio, ED                    0.86         <=1.3  LV e&', lateral                         9.87  cm/s   ---------  LV E/e&', lateral                       7.95         ---------  LV e&', medial                          10.6  cm/s   ---------  LV E/e&', medial                        7.41         ---------  LV  e&', average                         10.24 cm/s   ---------  LV E/e&', average                       7.67         ---------    Ventricular septum                     Value        Reference  IVS thickness, ED                      12.7  mm     ---------    LVOT                                   Value        Reference  LVOT peak velocity, S                  70.6  cm/s   ---------  LVOT mean velocity, S                  49.9  cm/s   ---------  LVOT VTI, S                            13.5  cm     ---------  LVOT peak gradient, S                  2     mm Hg  ---------    Aortic valve                           Value         Reference  Aortic valve peak velocity, S          150   cm/s   ---------  Aortic valve mean velocity, S          108   cm/s   ---------  Aortic valve VTI, S                    29    cm     ---------  Aortic mean gradient, S                5     mm Hg  ---------  Aortic peak gradient, S                9     mm Hg  ---------  VTI ratio, LVOT/AV                     0.47         ---------  Velocity ratio, peak, LVOT/AV          0.47         ---------  Velocity ratio, mean, LVOT/AV          0.46         ---------    Left atrium                            Value  Reference  LA ID, A-P, ES                         33    mm     ---------  LA ID/bsa, A-P                         1.56  cm/m^2 <=2.2  LA volume, ES, 1-p A4C                 38    ml     ---------  LA volume/bsa, ES, 1-p A4C             18    ml/m^2 ---------    Mitral valve                           Value        Reference  Mitral E-wave peak velocity            78.5  cm/s   ---------  Mitral A-wave peak velocity            51.8  cm/s   ---------  Mitral deceleration time       (L)     130   ms     150 - 230  Mitral peak gradient, D                2     mm Hg  ---------  Mitral E/A ratio, peak                 1.5          ---------    Pulmonary arteries                     Value        Reference  PA pressure, S, DP             (H)     37    mm Hg  <=30    Tricuspid valve                        Value        Reference  Tricuspid regurg peak velocity         270   cm/s   ---------  Tricuspid peak RV-RA gradient          29    mm Hg  ---------    Systemic veins                         Value        Reference  Estimated CVP                          8     mm Hg  ---------    Right ventricle                        Value        Reference  RV pressure, S, DP             (H)     37    mm Hg  <=30  Legend: (L)  and  (H)  mark values outside specified reference  range.  -------------------------------------------------------------------  Prepared and Electronically Authenticated by  Dorris Carnes, M.D. 2018-09-16T20:23:22      Assessment / Plan:      Patient status post Bentall with biologic valve, stable postoperatively without evidence of failure. CTA of the chest is stable Plan see the patient back in 1 year Endocarditis information was again reviewed with the patient    Grace Isaac MD      [address] [place] [address] Office [phone]   Beeper [phone]  05/04/2017 5:12 PM

## 2017-05-10 DIAGNOSIS — H401231 Low-tension glaucoma, bilateral, mild stage: Secondary | ICD-10-CM | POA: Diagnosis not present

## 2017-05-10 DIAGNOSIS — H25013 Cortical age-related cataract, bilateral: Secondary | ICD-10-CM | POA: Diagnosis not present

## 2017-05-10 DIAGNOSIS — H2513 Age-related nuclear cataract, bilateral: Secondary | ICD-10-CM | POA: Diagnosis not present

## 2017-05-18 DIAGNOSIS — Z6829 Body mass index (BMI) 29.0-29.9, adult: Secondary | ICD-10-CM | POA: Diagnosis not present

## 2017-05-18 DIAGNOSIS — M545 Low back pain: Secondary | ICD-10-CM | POA: Diagnosis not present

## 2017-05-27 NOTE — Progress Notes (Signed)
[facility]  PATIENT: Lonnie Hansen DOB: 1950/04/05   REASON FOR VISIT: Follow-up for  CPAP compliance HISTORY FROM: Patient    HISTORY OF PRESENT ILLNESS:UPDATE 5/6/2019CM Lonnie Hansen, 67 year old male returns for follow-up with history of obstructive sleep apnea here for CPAP compliance.  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  He has not been taken his CPAP machine with him which is evidenced by his compliance data.  He also reports he needs to have hip surgery and has seen Dr. Ninfa Linden.  CPAP compliance data dated 03/02/2017-05/30/2017 shows usage days greater than 4 hours at 50%.  He used CPAP 65 out of 90 days.  Average usage 5 hours 12 minutes.  Set pressure 6/14 cm EPR level 3 AHI 3.5 ESS 4.  He returns for reevaluation UPDATE 2/7/2019CM Lonnie Hansen, 67 year old male returns for follow-up with recent diagnosis of obstructive sleep apnea here for CPAP compliance .  He reports that he has trouble with his mask fitting turns over in bed at night and he hears an air leak.  Compliance data dated 02/02/2017 to 03/03/2017 shows compliance greater than 4 hours at 47%.  Average usage 4 hours 26 minutes pressure set 6-14 cm.  AHI 3.5 significant leak.  He says the mask keeps him from sleeping well but he has not reported this to the equipment company.  He only feels like he gets 45 minutes at a time that he is awake for a while.  He returns for reevaluation   HISTORY: CD This 67 year old Afro-American male Patient presented for OSA testing in 2011, received the diagnosis of OSA, but was not able to initiate CPAP due to insurance issues at the time. He is now re-referred by Dr. Philip Aspen. Pericardial effusion, CAD, open heart surgery, claustrophobia- informed me that he would not want a FFM .  The patient endorsed the Epworth Sleepiness Scale at 3/24 points, FSS at 59 points   The patient's weight 187 pounds with a height of 68 (inches), resulting in a BMI of 28.4 kg/m2.The patient's  neck circumference measured 16 inches. POLYSOMNOGRAPHY IMPRESSION:   1. Severe Obstructive Sleep Apnea(OSA) and Snoring- AHI was 31.9/hr. , all sleep in NREM and supine position. 2. Sleep apnea responded to CPAP at pressures of 7 and 8 cm water, but achieved REM sleep rebound at CPAP of 14 cm with nadir of 95%.    RECOMMENDATIONS: auto-titration CPAP with a pressure window between 6 and 14 cm water with 3 cm EPR.  1. A Fisher and Paykel FFM SIMPLUS in Medium size was used with heated humidity during this study.  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 I will ask the DME to change to a Wisp or Eson mask to be fitted. Advise to add heated humidity.  Adjust interface and heated humidity as needed.     2. Compliance to PAP therapy should be emphasized as 4 hours minimum use time per night.  Compliance, AHI and air leak information to be downloaded for objective assessment at 30 days, 180 days and annually thereafter.   3. Further information regarding OSA may be obtained from [employer] (www.sleepfoundation.org) or American Sleep Apnea Association (www.sleepapnea.org). 4. Consider dedicated sleep psychology referral if insomnia is of clinical concern.   5. A follow up appointment will be scheduled in the Sleep Clinic at [facility].        REVIEW  OF SYSTEMS: Full 14 system review of systems performed and notable only for those listed, all others are neg:  Constitutional: neg  Cardiovascular: neg Ear/Nose/Throat: neg  Skin: neg Eyes: Cataracts Respiratory: neg Gastroitestinal: neg  Hematology/Lymphatic: Anemia easy bruising   Endocrine: neg Musculoskeletal: hip pain Allergy/Immunology: neg Neurological: neg Psychiatric: neg Sleep : Obstructive sleep apnea with CPAP   ALLERGIES: No Known Allergies  HOME MEDICATIONS: Outpatient Medications Prior to Visit  Medication Sig  Dispense Refill  . amLODipine (NORVASC) 10 MG tablet Take 10 mg by mouth daily.    Marland Kitchen aspirin EC 81 MG EC tablet Take 1 tablet (81 mg total) by mouth daily.    Marland Kitchen atenolol (TENORMIN) 25 MG tablet Take 0.5 tablets (12.5 mg total) by mouth 2 (two) times daily. 30 tablet 1  . Ferrous Sulfate (IRON) 325 (65 Fe) MG TABS Take 1 tablet by mouth daily.    . furosemide (LASIX) 20 MG tablet TAKE 1 TABLET BY MOUTH ONCE DAILY 90 tablet 3  . latanoprost (XALATAN) 0.005 % ophthalmic solution Place 1 drop into both eyes at bedtime.    . Multiple Vitamin (MULTIVITAMIN WITH MINERALS) TABS tablet Take 1 tablet by mouth daily.    . rosuvastatin (CRESTOR) 10 MG tablet Take 1 tablet (10 mg total) by mouth daily. 90 tablet 3   No facility-administered medications prior to visit.     PAST MEDICAL HISTORY: Past Medical History:  Diagnosis Date  . Anemia   . Arthritis   . Asymptomatic bilateral carotid artery stenosis 08/2015   1-39%   . Blood dyscrasia    "trouble with my bloods clotting"  . Bruising    on the skin states due to platelets or sometimes low or high  . Cataracts, bilateral   . Coronary artery disease   . Diverticulosis   . Enlarged aorta ([place])   . Enlarged prostate    slightly  . GERD (gastroesophageal reflux disease)    takes Pantoprazole daily as needed  . Glaucoma    uses eye drops daily  . Headache   . History of colon polyps    benign  . History of kidney stones   . Hyperlipidemia    no on any meds  . Hypertension    takes Amlodipine and Atenolol daily  . Joint pain   . Joint swelling   . Nocturia   . OSA on CPAP   . Sleep apnea   . Vocal cord nodule    pt. states  it's a" growth on vocal cord"    PAST SURGICAL HISTORY: Past Surgical History:  Procedure Laterality Date  . AORTIC ARCH ANGIOGRAPHY N/A 04/16/2016   Procedure: Aortic Arch Angiography;  Surgeon: [name] Martinique, MD;  Location: [facility];  Service: Cardiovascular;  Laterality: N/A;  . AORTIC VALVE  REPLACEMENT N/A 09/15/2016   Procedure: AORTIC VALVE REPLACEMENT (AVR);  Surgeon: Grace Isaac, MD;  Location: [facility];  Service: Open Heart Surgery;  Laterality: N/A;  Using 32mm Edwards Perimount Magna Ease Aortic Bioprosthesis Valve  . ASCENDING AORTIC ROOT REPLACEMENT N/A 09/15/2016   Procedure: ASCENDING AORTIC ROOT REPLACEMENT;  Surgeon: Grace Isaac, MD;  Location: [facility];  Service: Open Heart Surgery;  Laterality: N/A;  Using 55mm Gelweave Valsalva Graft  . COLONOSCOPY    . COLONOSCOPY WITH ESOPHAGOGASTRODUODENOSCOPY (EGD)    . IR THORACENTESIS ASP PLEURAL SPACE W/IMG GUIDE  10/23/2016  . MULTIPLE EXTRACTIONS WITH ALVEOLOPLASTY N/A 06/10/2016   Procedure: Extraction of tooth #'s 2,8,13,15,  and 29  with alveoloplasty, maxillary right and left buccal exostoses reductions, and gross debridement of remaining teeth.;  Surgeon: Lenn Cal, DDS;  Location: [facility];  Service: Oral Surgery;  Laterality: N/A;  . RIGHT/LEFT HEART CATH AND CORONARY ANGIOGRAPHY N/A 04/16/2016   Procedure: Right/Left Heart Cath and Coronary Angiography;  Surgeon: [name] Martinique, MD;  Location: [facility];  Service: Cardiovascular;  Laterality: N/A;  . TEE WITHOUT CARDIOVERSION N/A 09/15/2016   Procedure: TRANSESOPHAGEAL ECHOCARDIOGRAM (TEE);  Surgeon: Grace Isaac, MD;  Location: [facility];  Service: Open Heart Surgery;  Laterality: N/A;    FAMILY HISTORY: Family History  Problem Relation Age of Onset  . Heart disease Father   . Heart attack Father   . Thyroid disease Sister     SOCIAL HISTORY: Social History   Socioeconomic History  . Marital status: Married    Spouse name: Not on file  . Number of children: 1  . Years of education: Not on file  . Highest education level: Not on file  Occupational History  . Occupation: Musician  Social Needs  . Financial resource strain: Not on file  . Food insecurity:    Worry: Not on file    Inability: Not on file  . Transportation needs:     Medical: Not on file    Non-medical: Not on file  Tobacco Use  . Smoking status: Former Smoker    Packs/day: 1.00    Years: 25.00    Pack years: 25.00    Types: Cigarettes  . Smokeless tobacco: Never Used  Substance and Sexual Activity  . Alcohol use: No    Alcohol/week: 0.0 oz  . Drug use: No  . Sexual activity: Not on file  Lifestyle  . Physical activity:    Days per week: Not on file    Minutes per session: Not on file  . Stress: Not on file  Relationships  . Social connections:    Talks on phone: Not on file    Gets together: Not on file    Attends religious service: Not on file    Active member of club or organization: Not on file    Attends meetings of clubs or organizations: Not on file    Relationship status: Not on file  . Intimate partner violence:    Fear of current or ex partner: Not on file    Emotionally abused: Not on file    Physically abused: Not on file    Forced sexual activity: Not on file  Other Topics Concern  . 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     PHYSICAL EXAM  Vitals:   05/31/17 1503  BP: 110/70  Pulse: 65  Weight: 194 lb 3.2 oz (88.1 kg)  Height: 5' 7.5" (1.715 m)   Body mass index is 29.97 kg/m.  Generalized: Well developed, in no acute distress  Head: normocephalic and atraumatic,. Oropharynx benign mallopati 3 Neck: Supple, circumference 16 Musculoskeletal: No deformity   Neurological examination   Mentation: Alert oriented to time, place, history taking. Attention span and concentration appropriate. Recent and remote memory intact.  Follows all commands speech and language fluent.   Cranial nerve II-XII: Pupils were equal round reactive to light extraocular movements were full, visual field were full on confrontational test. Facial sensation and strength were normal. hearing was intact to finger rubbing bilaterally. Uvula tongue midline. head  turning and shoulder  shrug were normal and symmetric.Tongue protrusion into cheek strength was normal. Motor: normal bulk and tone, full strength in the BUE, BLE,  Sensory: normal and symmetric to light touch,  Coordination: finger-nose-finger,  no dysmetria Gait and Station: Rising up from seated position without assistance, normal stance ambulates with single-point cane without difficulty has a limp on the right.   DIAGNOSTIC DATA (LABS, IMAGING, TESTING) - I reviewed patient records, labs, notes, testing and imaging myself where available.  Lab Results  Component Value Date   WBC 6.9 11/04/2016   HGB 10.8 (L) 11/04/2016   HCT 35.8 (L) 11/04/2016   MCV 89.7 11/04/2016   PLT 120 (L) 11/04/2016      Component Value Date/Time   NA 142 03/31/2017 1534   K 3.7 03/31/2017 1534   CL 101 03/31/2017 1534   CO2 24 03/31/2017 1534   GLUCOSE 85 03/31/2017 1534   GLUCOSE 136 (H) 11/04/2016 0838   BUN 13 03/31/2017 1534   CREATININE 0.94 03/31/2017 1534   CREATININE 0.75 04/10/2016 1132   CALCIUM 9.5 03/31/2017 1534   PROT 7.4 03/31/2017 1523   ALBUMIN 4.7 03/31/2017 1523   AST 22 03/31/2017 1523   ALT 10 03/31/2017 1523   ALKPHOS 47 03/31/2017 1523   BILITOT 0.5 03/31/2017 1523   GFRNONAA 84 03/31/2017 1534   GFRAA 97 03/31/2017 1534   Lab Results  Component Value Date   CHOL 186 03/31/2017   HDL 37 (L) 03/31/2017   LDLCALC 105 (H) 03/31/2017   TRIG 218 (H) 03/31/2017   CHOLHDL 4.0 10/07/2016   Lab Results  Component Value Date   HGBA1C 5.3 09/11/2016    Lab Results  Component Value Date   TSH 1.611 04/28/2010      ASSESSMENT AND PLAN  67 y.o. year old male  has a past medical history of obstructive sleep apnea recently diagnosed new on CPAP.Compliance data dated 03/02/2017-05/30/2017 shows usage days greater than 4 hours at 50%.  He used CPAP 65 out of 90 days.  Average usage 5 hours 12 minutes.  Set pressure 6/14 cm EPR level 3 AHI 3.5 ESS 4.    CPAP compliance 50 %  Continue same  settings Take your machine with you when you travel there were 25 days of no usage  Good luck with hip surgery F/U in 3 months for repeat compliance Dennie Bible, [facility], [facility], [facility] [address], [place], [place] [address] [phone]

## 2017-05-30 ENCOUNTER — Encounter: Payer: Self-pay | Admitting: Nurse Practitioner

## 2017-05-31 ENCOUNTER — Encounter: Payer: Self-pay | Admitting: Nurse Practitioner

## 2017-05-31 ENCOUNTER — Ambulatory Visit (INDEPENDENT_AMBULATORY_CARE_PROVIDER_SITE_OTHER): Payer: Medicare Other | Admitting: Nurse Practitioner

## 2017-05-31 VITALS — BP 110/70 | HR 65 | Ht 67.5 in | Wt 194.2 lb

## 2017-05-31 DIAGNOSIS — I2510 Atherosclerotic heart disease of native coronary artery without angina pectoris: Secondary | ICD-10-CM

## 2017-05-31 DIAGNOSIS — Z9989 Dependence on other enabling machines and devices: Secondary | ICD-10-CM

## 2017-05-31 DIAGNOSIS — G4733 Obstructive sleep apnea (adult) (pediatric): Secondary | ICD-10-CM

## 2017-05-31 NOTE — Patient Instructions (Signed)
CPAP compliance 50 %  Continue same settings Take your machine with you when you travel Good luck with hip surgery F/U in 3 months for repeat compliance

## 2017-06-04 NOTE — Progress Notes (Signed)
I appreciate the assessment and plan as directed by NP Hassell Done .The patient is known to me . He needs to understand that surgery and the involved anesthesia are of a higher risk for a non treated apnea patient and needs to return to compliant use prior to surgery !    Alayasia Breeding, MD

## 2017-06-25 DIAGNOSIS — Z6829 Body mass index (BMI) 29.0-29.9, adult: Secondary | ICD-10-CM | POA: Diagnosis not present

## 2017-06-25 DIAGNOSIS — N6312 Unspecified lump in the right breast, upper inner quadrant: Secondary | ICD-10-CM | POA: Diagnosis not present

## 2017-06-25 DIAGNOSIS — R05 Cough: Secondary | ICD-10-CM | POA: Diagnosis not present

## 2017-06-25 DIAGNOSIS — G4733 Obstructive sleep apnea (adult) (pediatric): Secondary | ICD-10-CM | POA: Diagnosis not present

## 2017-06-25 DIAGNOSIS — R490 Dysphonia: Secondary | ICD-10-CM | POA: Diagnosis not present

## 2017-06-25 DIAGNOSIS — I2510 Atherosclerotic heart disease of native coronary artery without angina pectoris: Secondary | ICD-10-CM | POA: Diagnosis not present

## 2017-06-25 DIAGNOSIS — I10 Essential (primary) hypertension: Secondary | ICD-10-CM | POA: Diagnosis not present

## 2017-08-03 DIAGNOSIS — I10 Essential (primary) hypertension: Secondary | ICD-10-CM | POA: Diagnosis not present

## 2017-08-03 DIAGNOSIS — L739 Follicular disorder, unspecified: Secondary | ICD-10-CM | POA: Diagnosis not present

## 2017-08-03 DIAGNOSIS — Z6829 Body mass index (BMI) 29.0-29.9, adult: Secondary | ICD-10-CM | POA: Diagnosis not present

## 2017-08-10 DIAGNOSIS — H903 Sensorineural hearing loss, bilateral: Secondary | ICD-10-CM | POA: Diagnosis not present

## 2017-08-10 DIAGNOSIS — R490 Dysphonia: Secondary | ICD-10-CM | POA: Diagnosis not present

## 2017-08-10 DIAGNOSIS — H838X3 Other specified diseases of inner ear, bilateral: Secondary | ICD-10-CM | POA: Diagnosis not present

## 2017-08-10 DIAGNOSIS — J381 Polyp of vocal cord and larynx: Secondary | ICD-10-CM | POA: Diagnosis not present

## 2017-08-16 ENCOUNTER — Other Ambulatory Visit: Payer: Self-pay | Admitting: Otolaryngology

## 2017-09-06 NOTE — Progress Notes (Deleted)
[facility]  PATIENT: Lonnie Hansen DOB: 1950/04/05   REASON FOR VISIT: Follow-up for  CPAP compliance HISTORY FROM: Patient    HISTORY OF PRESENT ILLNESS:UPDATE 5/6/2019CM Lonnie Hansen, 67 year old male returns for follow-up with history of obstructive sleep apnea here for CPAP compliance.  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  He has not been taken his CPAP machine with him which is evidenced by his compliance data.  He also reports he needs to have hip surgery and has seen Dr. Ninfa Linden.  CPAP compliance data dated 03/02/2017-05/30/2017 shows usage days greater than 4 hours at 50%.  He used CPAP 65 out of 90 days.  Average usage 5 hours 12 minutes.  Set pressure 6/14 cm EPR level 3 AHI 3.5 ESS 4.  He returns for reevaluation UPDATE 2/7/2019CM Lonnie Hansen, 67 year old male returns for follow-up with recent diagnosis of obstructive sleep apnea here for CPAP compliance .  He reports that he has trouble with his mask fitting turns over in bed at night and he hears an air leak.  Compliance data dated 02/02/2017 to 03/03/2017 shows compliance greater than 4 hours at 47%.  Average usage 4 hours 26 minutes pressure set 6-14 cm.  AHI 3.5 significant leak.  He says the mask keeps him from sleeping well but he has not reported this to the equipment company.  He only feels like he gets 45 minutes at a time that he is awake for a while.  He returns for reevaluation   HISTORY: CD This 67 year old Afro-American male Patient presented for OSA testing in 2011, received the diagnosis of OSA, but was not able to initiate CPAP due to insurance issues at the time. He is now re-referred by Dr. Philip Aspen. Pericardial effusion, CAD, open heart surgery, claustrophobia- informed me that he would not want a FFM .  The patient endorsed the Epworth Sleepiness Scale at 3/24 points, FSS at 59 points   The patient's weight 187 pounds with a height of 68 (inches), resulting in a BMI of 28.4 kg/m2.The patient's  neck circumference measured 16 inches. POLYSOMNOGRAPHY IMPRESSION:   1. Severe Obstructive Sleep Apnea(OSA) and Snoring- AHI was 31.9/hr. , all sleep in NREM and supine position. 2. Sleep apnea responded to CPAP at pressures of 7 and 8 cm water, but achieved REM sleep rebound at CPAP of 14 cm with nadir of 95%.    RECOMMENDATIONS: auto-titration CPAP with a pressure window between 6 and 14 cm water with 3 cm EPR.  1. A Fisher and Paykel FFM SIMPLUS in Medium size was used with heated humidity during this study.  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 I will ask the DME to change to a Wisp or Eson mask to be fitted. Advise to add heated humidity.  Adjust interface and heated humidity as needed.     2. Compliance to PAP therapy should be emphasized as 4 hours minimum use time per night.  Compliance, AHI and air leak information to be downloaded for objective assessment at 30 days, 180 days and annually thereafter.   3. Further information regarding OSA may be obtained from [employer] (www.sleepfoundation.org) or American Sleep Apnea Association (www.sleepapnea.org). 4. Consider dedicated sleep psychology referral if insomnia is of clinical concern.   5. A follow up appointment will be scheduled in the Sleep Clinic at [facility].        REVIEW  OF SYSTEMS: Full 14 system review of systems performed and notable only for those listed, all others are neg:  Constitutional: neg  Cardiovascular: neg Ear/Nose/Throat: neg  Skin: neg Eyes: Cataracts Respiratory: neg Gastroitestinal: neg  Hematology/Lymphatic: Anemia easy bruising   Endocrine: neg Musculoskeletal: hip pain Allergy/Immunology: neg Neurological: neg Psychiatric: neg Sleep : Obstructive sleep apnea with CPAP   ALLERGIES: No Known Allergies  HOME MEDICATIONS: Outpatient Medications Prior to Visit  Medication Sig  Dispense Refill  . amLODipine (NORVASC) 10 MG tablet Take 10 mg by mouth daily.    Marland Kitchen aspirin EC 81 MG EC tablet Take 1 tablet (81 mg total) by mouth daily.    Marland Kitchen atenolol (TENORMIN) 25 MG tablet Take 0.5 tablets (12.5 mg total) by mouth 2 (two) times daily. 30 tablet 1  . Ferrous Sulfate (IRON) 325 (65 Fe) MG TABS Take 1 tablet by mouth daily.    . furosemide (LASIX) 20 MG tablet TAKE 1 TABLET BY MOUTH ONCE DAILY 90 tablet 3  . latanoprost (XALATAN) 0.005 % ophthalmic solution Place 1 drop into both eyes at bedtime.    . Multiple Vitamin (MULTIVITAMIN WITH MINERALS) TABS tablet Take 1 tablet by mouth daily.    . rosuvastatin (CRESTOR) 10 MG tablet Take 1 tablet (10 mg total) by mouth daily. 90 tablet 3   No facility-administered medications prior to visit.     PAST MEDICAL HISTORY: Past Medical History:  Diagnosis Date  . Anemia   . Arthritis   . Asymptomatic bilateral carotid artery stenosis 08/2015   1-39%   . Blood dyscrasia    "trouble with my bloods clotting"  . Bruising    on the skin states due to platelets or sometimes low or high  . Cataracts, bilateral   . Coronary artery disease   . Diverticulosis   . Enlarged aorta ([place])   . Enlarged prostate    slightly  . GERD (gastroesophageal reflux disease)    takes Pantoprazole daily as needed  . Glaucoma    uses eye drops daily  . Headache   . History of colon polyps    benign  . History of kidney stones   . Hyperlipidemia    no on any meds  . Hypertension    takes Amlodipine and Atenolol daily  . Joint pain   . Joint swelling   . Nocturia   . OSA on CPAP   . Sleep apnea   . Vocal cord nodule    pt. states  it's a" growth on vocal cord"    PAST SURGICAL HISTORY: Past Surgical History:  Procedure Laterality Date  . AORTIC ARCH ANGIOGRAPHY N/A 04/16/2016   Procedure: Aortic Arch Angiography;  Surgeon: [name] Martinique, MD;  Location: [facility];  Service: Cardiovascular;  Laterality: N/A;  . AORTIC VALVE  REPLACEMENT N/A 09/15/2016   Procedure: AORTIC VALVE REPLACEMENT (AVR);  Surgeon: Grace Isaac, MD;  Location: [facility];  Service: Open Heart Surgery;  Laterality: N/A;  Using 32mm Edwards Perimount Magna Ease Aortic Bioprosthesis Valve  . ASCENDING AORTIC ROOT REPLACEMENT N/A 09/15/2016   Procedure: ASCENDING AORTIC ROOT REPLACEMENT;  Surgeon: Grace Isaac, MD;  Location: [place];  Service: Open Heart Surgery;  Laterality: N/A;  Using 55mm Gelweave Valsalva Graft  . COLONOSCOPY    . COLONOSCOPY WITH ESOPHAGOGASTRODUODENOSCOPY (EGD)    . IR THORACENTESIS ASP PLEURAL SPACE W/IMG GUIDE  10/23/2016  . MULTIPLE EXTRACTIONS WITH ALVEOLOPLASTY N/A 06/10/2016   Procedure: Extraction of tooth #'s 2,8,13,15,  and 29  with alveoloplasty, maxillary right and left buccal exostoses reductions, and gross debridement of remaining teeth.;  Surgeon: Lenn Cal, DDS;  Location: [facility];  Service: Oral Surgery;  Laterality: N/A;  . RIGHT/LEFT HEART CATH AND CORONARY ANGIOGRAPHY N/A 04/16/2016   Procedure: Right/Left Heart Cath and Coronary Angiography;  Surgeon: [name] Martinique, MD;  Location: [place] [facility];  Service: Cardiovascular;  Laterality: N/A;  . TEE WITHOUT CARDIOVERSION N/A 09/15/2016   Procedure: TRANSESOPHAGEAL ECHOCARDIOGRAM (TEE);  Surgeon: Grace Isaac, MD;  Location: [place];  Service: Open Heart Surgery;  Laterality: N/A;    FAMILY HISTORY: Family History  Problem Relation Age of Onset  . Heart disease Father   . Heart attack Father   . Thyroid disease Sister     SOCIAL HISTORY: Social History   Socioeconomic History  . Marital status: Married    Spouse name: Not on file  . Number of children: 1  . Years of education: Not on file  . Highest education level: Not on file  Occupational History  . Occupation: Musician  Social Needs  . Financial resource strain: Not on file  . Food insecurity:    Worry: Not on file    Inability: Not on file  . Transportation needs:     Medical: Not on file    Non-medical: Not on file  Tobacco Use  . Smoking status: Former Smoker    Packs/day: 1.00    Years: 25.00    Pack years: 25.00    Types: Cigarettes  . Smokeless tobacco: Never Used  Substance and Sexual Activity  . Alcohol use: No    Alcohol/week: 0.0 standard drinks  . Drug use: No  . Sexual activity: Not on file  Lifestyle  . Physical activity:    Days per week: Not on file    Minutes per session: Not on file  . Stress: Not on file  Relationships  . Social connections:    Talks on phone: Not on file    Gets together: Not on file    Attends religious service: Not on file    Active member of club or organization: Not on file    Attends meetings of clubs or organizations: Not on file    Relationship status: Not on file  . Intimate partner violence:    Fear of current or ex partner: Not on file    Emotionally abused: Not on file    Physically abused: Not on file    Forced sexual activity: Not on file  Other Topics Concern  . 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     PHYSICAL EXAM  There were no vitals filed for this visit. There is no height or weight on file to calculate BMI.  Generalized: Well developed, in no acute distress  Head: normocephalic and atraumatic,. Oropharynx benign mallopati 3 Neck: Supple, circumference 16 Musculoskeletal: No deformity   Neurological examination   Mentation: Alert oriented to time, place, history taking. Attention span and concentration appropriate. Recent and remote memory intact.  Follows all commands speech and language fluent.   Cranial nerve II-XII: Pupils were equal round reactive to light extraocular movements were full, visual field were full on confrontational test. Facial sensation and strength were normal. hearing was intact to finger rubbing bilaterally. Uvula tongue midline. head turning and shoulder shrug were normal and symmetric.Tongue  protrusion into cheek strength  was normal. Motor: normal bulk and tone, full strength in the BUE, BLE,  Sensory: normal and symmetric to light touch,  Coordination: finger-nose-finger,  no dysmetria Gait and Station: Rising up from seated position without assistance, normal stance ambulates with single-point cane without difficulty has a limp on the right.   DIAGNOSTIC DATA (LABS, IMAGING, TESTING) - I reviewed patient records, labs, notes, testing and imaging myself where available.  Lab Results  Component Value Date   WBC 6.9 11/04/2016   HGB 10.8 (L) 11/04/2016   HCT 35.8 (L) 11/04/2016   MCV 89.7 11/04/2016   PLT 120 (L) 11/04/2016      Component Value Date/Time   NA 142 03/31/2017 1534   K 3.7 03/31/2017 1534   CL 101 03/31/2017 1534   CO2 24 03/31/2017 1534   GLUCOSE 85 03/31/2017 1534   GLUCOSE 136 (H) 11/04/2016 0838   BUN 13 03/31/2017 1534   CREATININE 0.94 03/31/2017 1534   CREATININE 0.75 04/10/2016 1132   CALCIUM 9.5 03/31/2017 1534   PROT 7.4 03/31/2017 1523   ALBUMIN 4.7 03/31/2017 1523   AST 22 03/31/2017 1523   ALT 10 03/31/2017 1523   ALKPHOS 47 03/31/2017 1523   BILITOT 0.5 03/31/2017 1523   GFRNONAA 84 03/31/2017 1534   GFRAA 97 03/31/2017 1534   Lab Results  Component Value Date   CHOL 186 03/31/2017   HDL 37 (L) 03/31/2017   LDLCALC 105 (H) 03/31/2017   TRIG 218 (H) 03/31/2017   CHOLHDL 4.0 10/07/2016   Lab Results  Component Value Date   HGBA1C 5.3 09/11/2016    Lab Results  Component Value Date   TSH 1.611 04/28/2010      ASSESSMENT AND PLAN  67 y.o. year old male  has a past medical history of obstructive sleep apnea recently diagnosed new on CPAP.Compliance data dated 03/02/2017-05/30/2017 shows usage days greater than 4 hours at 50%.  He used CPAP 65 out of 90 days.  Average usage 5 hours 12 minutes.  Set pressure 6/14 cm EPR level 3 AHI 3.5 ESS 4.    CPAP compliance 50 %  Continue same settings Take your machine with you when  you travel there were 25 days of no usage  Good luck with hip surgery F/U in 3 months for repeat compliance Dennie Bible, [facility], [facility], [facility] [address] [place], [place] [address] [phone]

## 2017-09-07 ENCOUNTER — Ambulatory Visit: Payer: Medicare Other | Admitting: Nurse Practitioner

## 2017-09-15 ENCOUNTER — Other Ambulatory Visit: Payer: Self-pay

## 2017-09-15 ENCOUNTER — Encounter (HOSPITAL_BASED_OUTPATIENT_CLINIC_OR_DEPARTMENT_OTHER): Payer: Self-pay | Admitting: *Deleted

## 2017-09-15 NOTE — Progress Notes (Signed)
Reviewed pts history with Dr. Gifford Shave, Faythe Ghee to have surgery without cardiac clearance, will bring pt in for pre-op labs.

## 2017-09-15 NOTE — Progress Notes (Signed)
During pre op call pt states he has a blood clotting disorder, discussed with Dr. Gifford Shave, will bring in for pre-op labs including CBC

## 2017-09-17 ENCOUNTER — Encounter (HOSPITAL_BASED_OUTPATIENT_CLINIC_OR_DEPARTMENT_OTHER)
Admission: RE | Admit: 2017-09-17 | Discharge: 2017-09-17 | Disposition: A | Discharge status: Home / Self Care | Payer: Medicare Other | Source: Ambulatory Visit | Attending: Otolaryngology | Admitting: Otolaryngology

## 2017-09-17 DIAGNOSIS — K219 Gastro-esophageal reflux disease without esophagitis: Secondary | ICD-10-CM | POA: Diagnosis not present

## 2017-09-17 DIAGNOSIS — G4730 Sleep apnea, unspecified: Secondary | ICD-10-CM | POA: Diagnosis not present

## 2017-09-17 DIAGNOSIS — I2510 Atherosclerotic heart disease of native coronary artery without angina pectoris: Secondary | ICD-10-CM | POA: Diagnosis not present

## 2017-09-17 DIAGNOSIS — H903 Sensorineural hearing loss, bilateral: Secondary | ICD-10-CM | POA: Diagnosis not present

## 2017-09-17 DIAGNOSIS — I10 Essential (primary) hypertension: Secondary | ICD-10-CM | POA: Diagnosis not present

## 2017-09-17 DIAGNOSIS — Z87891 Personal history of nicotine dependence: Secondary | ICD-10-CM | POA: Diagnosis not present

## 2017-09-17 DIAGNOSIS — J383 Other diseases of vocal cords: Secondary | ICD-10-CM | POA: Diagnosis not present

## 2017-09-17 DIAGNOSIS — R490 Dysphonia: Secondary | ICD-10-CM | POA: Diagnosis not present

## 2017-09-17 DIAGNOSIS — J381 Polyp of vocal cord and larynx: Secondary | ICD-10-CM | POA: Diagnosis not present

## 2017-09-17 DIAGNOSIS — M1990 Unspecified osteoarthritis, unspecified site: Secondary | ICD-10-CM | POA: Diagnosis not present

## 2017-09-17 DIAGNOSIS — Z8249 Family history of ischemic heart disease and other diseases of the circulatory system: Secondary | ICD-10-CM | POA: Diagnosis not present

## 2017-09-17 LAB — COMPREHENSIVE METABOLIC PANEL
ALBUMIN: 4.5 g/dL (ref 3.5–5.0)
ALK PHOS: 47 U/L (ref 38–126)
ALT: 12 U/L (ref 0–44)
AST: 19 U/L (ref 15–41)
Anion gap: 8 (ref 5–15)
BILIRUBIN TOTAL: 0.9 mg/dL (ref 0.3–1.2)
BUN: 15 mg/dL (ref 8–23)
CALCIUM: 9.3 mg/dL (ref 8.9–10.3)
CO2: 27 mmol/L (ref 22–32)
Chloride: 103 mmol/L (ref 98–111)
Creatinine, Ser: 0.84 mg/dL (ref 0.61–1.24)
GFR calc Af Amer: >60 mL/min (ref >60)
GFR calc non Af Amer: >60 mL/min (ref >60)
GLUCOSE: 100 mg/dL — AB (ref 70–99)
Potassium: 3.9 mmol/L (ref 3.5–5.1)
SODIUM: 138 mmol/L (ref 135–145)
Total Protein: 6.9 g/dL (ref 6.5–8.1)

## 2017-09-17 LAB — CBC
HCT: 38.1 % — ABNORMAL LOW (ref 39.0–52.0)
HEMOGLOBIN: 12.7 g/dL — AB (ref 13.0–17.0)
MCH: 31.6 pg (ref 26.0–34.0)
MCHC: 33.3 g/dL (ref 30.0–36.0)
MCV: 94.8 fL (ref 78.0–100.0)
Platelets: 82 10*3/uL — ABNORMAL LOW (ref 150–400)
RBC: 4.02 MIL/uL — ABNORMAL LOW (ref 4.22–5.81)
RDW: 13 % (ref 11.5–15.5)
WBC: 5.6 10*3/uL (ref 4.0–10.5)

## 2017-09-20 ENCOUNTER — Other Ambulatory Visit: Payer: Self-pay

## 2017-09-20 ENCOUNTER — Ambulatory Visit (HOSPITAL_BASED_OUTPATIENT_CLINIC_OR_DEPARTMENT_OTHER): Payer: Medicare Other | Admitting: Anesthesiology

## 2017-09-20 ENCOUNTER — Encounter (HOSPITAL_BASED_OUTPATIENT_CLINIC_OR_DEPARTMENT_OTHER): Payer: Self-pay

## 2017-09-20 ENCOUNTER — Encounter (HOSPITAL_BASED_OUTPATIENT_CLINIC_OR_DEPARTMENT_OTHER)
Admission: RE | Disposition: A | Discharge status: Home / Self Care | Payer: Self-pay | Source: Ambulatory Visit | Attending: Otolaryngology

## 2017-09-20 ENCOUNTER — Ambulatory Visit (HOSPITAL_BASED_OUTPATIENT_CLINIC_OR_DEPARTMENT_OTHER)
Admission: RE | Admit: 2017-09-20 | Discharge: 2017-09-20 | Disposition: A | Discharge status: Home / Self Care | Payer: Medicare Other | Source: Ambulatory Visit | Attending: Otolaryngology | Admitting: Otolaryngology

## 2017-09-20 DIAGNOSIS — H903 Sensorineural hearing loss, bilateral: Secondary | ICD-10-CM | POA: Insufficient documentation

## 2017-09-20 DIAGNOSIS — Z8249 Family history of ischemic heart disease and other diseases of the circulatory system: Secondary | ICD-10-CM | POA: Diagnosis not present

## 2017-09-20 DIAGNOSIS — I10 Essential (primary) hypertension: Secondary | ICD-10-CM | POA: Diagnosis not present

## 2017-09-20 DIAGNOSIS — R490 Dysphonia: Secondary | ICD-10-CM | POA: Insufficient documentation

## 2017-09-20 DIAGNOSIS — D380 Neoplasm of uncertain behavior of larynx: Secondary | ICD-10-CM | POA: Diagnosis not present

## 2017-09-20 DIAGNOSIS — J381 Polyp of vocal cord and larynx: Secondary | ICD-10-CM | POA: Insufficient documentation

## 2017-09-20 DIAGNOSIS — Z87891 Personal history of nicotine dependence: Secondary | ICD-10-CM | POA: Insufficient documentation

## 2017-09-20 DIAGNOSIS — M1990 Unspecified osteoarthritis, unspecified site: Secondary | ICD-10-CM | POA: Insufficient documentation

## 2017-09-20 DIAGNOSIS — I2510 Atherosclerotic heart disease of native coronary artery without angina pectoris: Secondary | ICD-10-CM | POA: Insufficient documentation

## 2017-09-20 DIAGNOSIS — J383 Other diseases of vocal cords: Secondary | ICD-10-CM | POA: Diagnosis not present

## 2017-09-20 DIAGNOSIS — G4730 Sleep apnea, unspecified: Secondary | ICD-10-CM | POA: Insufficient documentation

## 2017-09-20 DIAGNOSIS — K219 Gastro-esophageal reflux disease without esophagitis: Secondary | ICD-10-CM | POA: Insufficient documentation

## 2017-09-20 HISTORY — PX: MICROLARYNGOSCOPY: SHX5208

## 2017-09-20 SURGERY — MICROLARYNGOSCOPY
Anesthesia: General | Site: Throat | Laterality: Right

## 2017-09-20 MED ORDER — FENTANYL CITRATE (PF) 100 MCG/2ML IJ SOLN
INTRAMUSCULAR | Status: AC
  Filled 2017-09-20: qty 2

## 2017-09-20 MED ORDER — OXYCODONE HCL 5 MG PO TABS: 5.0000 mg | ORAL_TABLET | Freq: Once | ORAL | Status: DC | PRN

## 2017-09-20 MED ORDER — PROPOFOL 10 MG/ML IV BOLUS
INTRAVENOUS | Status: DC | PRN
  Administered 2017-09-20: 40 mg via INTRAVENOUS
  Administered 2017-09-20: 100 mg via INTRAVENOUS
  Administered 2017-09-20: 20 mg via INTRAVENOUS

## 2017-09-20 MED ORDER — FENTANYL CITRATE (PF) 100 MCG/2ML IJ SOLN
50.0000 ug | INTRAMUSCULAR | Status: DC | PRN
  Administered 2017-09-20 (×2): 50 ug via INTRAVENOUS

## 2017-09-20 MED ORDER — MEPERIDINE HCL 25 MG/ML IJ SOLN: 6.2500 mg | INTRAMUSCULAR | Status: DC | PRN

## 2017-09-20 MED ORDER — LACTATED RINGERS IV SOLN
INTRAVENOUS | Status: DC
  Administered 2017-09-20: 10:00:00 via INTRAVENOUS

## 2017-09-20 MED ORDER — DEXAMETHASONE SODIUM PHOSPHATE 4 MG/ML IJ SOLN
INTRAMUSCULAR | Status: DC | PRN
  Administered 2017-09-20: 10 mg via INTRAVENOUS

## 2017-09-20 MED ORDER — EPINEPHRINE PF 1 MG/ML IJ SOLN
INTRAMUSCULAR | Status: DC | PRN
  Administered 2017-09-20: .25 mL

## 2017-09-20 MED ORDER — ONDANSETRON HCL 4 MG/2ML IJ SOLN
INTRAMUSCULAR | Status: DC | PRN
  Administered 2017-09-20: 4 mg via INTRAVENOUS

## 2017-09-20 MED ORDER — MIDAZOLAM HCL 2 MG/2ML IJ SOLN
INTRAMUSCULAR | Status: AC
  Filled 2017-09-20: qty 2

## 2017-09-20 MED ORDER — SCOPOLAMINE 1 MG/3DAYS TD PT72: 1.0000 | MEDICATED_PATCH | Freq: Once | TRANSDERMAL | Status: DC | PRN

## 2017-09-20 MED ORDER — LIDOCAINE 2% (20 MG/ML) 5 ML SYRINGE
INTRAMUSCULAR | Status: DC | PRN
  Administered 2017-09-20: 60 mg via INTRAVENOUS

## 2017-09-20 MED ORDER — MIDAZOLAM HCL 2 MG/2ML IJ SOLN
1.0000 mg | INTRAMUSCULAR | Status: DC | PRN
  Administered 2017-09-20: 1 mg via INTRAVENOUS

## 2017-09-20 MED ORDER — PROMETHAZINE HCL 25 MG/ML IJ SOLN: 6.2500 mg | INTRAMUSCULAR | Status: DC | PRN

## 2017-09-20 MED ORDER — HYDROMORPHONE HCL 1 MG/ML IJ SOLN: 0.2500 mg | INTRAMUSCULAR | Status: DC | PRN

## 2017-09-20 MED ORDER — OXYCODONE HCL 5 MG/5ML PO SOLN: 5.0000 mg | Freq: Once | ORAL | Status: DC | PRN

## 2017-09-20 MED ORDER — CEFAZOLIN SODIUM-DEXTROSE 2-3 GM-%(50ML) IV SOLR
INTRAVENOUS | Status: DC | PRN
  Administered 2017-09-20: 2 g via INTRAVENOUS

## 2017-09-20 MED ORDER — SUCCINYLCHOLINE CHLORIDE 20 MG/ML IJ SOLN
INTRAMUSCULAR | Status: DC | PRN
  Administered 2017-09-20: 80 mg via INTRAVENOUS

## 2017-09-20 SURGICAL SUPPLY — 28 items
BNDG EYE OVAL (MISCELLANEOUS) ×2 IMPLANT
CANISTER SUCT 1200ML W/VALVE (MISCELLANEOUS) ×3 IMPLANT
DEPRESSOR TONGUE BLADE STERILE (MISCELLANEOUS) ×1 IMPLANT
FILTER 7/8 IN (FILTER) ×1 IMPLANT
GAUZE SPONGE 4X4 12PLY STRL LF (GAUZE/BANDAGES/DRESSINGS) ×6 IMPLANT
GLOVE BIO SURGEON STRL SZ7 (GLOVE) ×2 IMPLANT
GLOVE BIO SURGEON STRL SZ7.5 (GLOVE) ×3 IMPLANT
GLOVE BIOGEL PI IND STRL 7.0 (GLOVE) ×1 IMPLANT
GLOVE BIOGEL PI INDICATOR 7.0 (GLOVE) ×1
GOWN STRL REUS W/ TWL LRG LVL3 (GOWN DISPOSABLE) ×1 IMPLANT
GOWN STRL REUS W/TWL LRG LVL3 (GOWN DISPOSABLE) ×3
GUARD TEETH (MISCELLANEOUS) ×2 IMPLANT
MARKER SKIN DUAL TIP RULER LAB (MISCELLANEOUS) IMPLANT
NDL HYPO 18GX1.5 BLUNT FILL (NEEDLE) ×1 IMPLANT
NDL SPNL 22GX7 QUINCKE BK (NEEDLE) IMPLANT
NEEDLE HYPO 18GX1.5 BLUNT FILL (NEEDLE) ×3 IMPLANT
NEEDLE SPNL 22GX7 QUINCKE BK (NEEDLE) IMPLANT
NS IRRIG 1000ML POUR BTL (IV SOLUTION) ×3 IMPLANT
PACK BASIN DAY SURGERY FS (CUSTOM PROCEDURE TRAY) ×3 IMPLANT
PATTIES SURGICAL .5 X3 (DISPOSABLE) ×3 IMPLANT
REDUCTION FITTING 1/4 IN (FILTER) ×1 IMPLANT
SHEET MEDIUM DRAPE 40X70 STRL (DRAPES) ×3 IMPLANT
SLEEVE SCD COMPRESS KNEE MED (MISCELLANEOUS) ×2 IMPLANT
SOLUTION BUTLER CLEAR DIP (MISCELLANEOUS) ×3 IMPLANT
SURGILUBE 2OZ TUBE FLIPTOP (MISCELLANEOUS) IMPLANT
SYR CONTROL 10ML LL (SYRINGE) IMPLANT
TOWEL GREEN STERILE FF (TOWEL DISPOSABLE) ×3 IMPLANT
TUBE CONNECTING 20X1/4 (TUBING) ×3 IMPLANT

## 2017-09-20 NOTE — Discharge Instructions (Addendum)
The patient may resume all his previous activities and diet. He will follow-up in my office in one week.    Post Anesthesia Home Care Instructions  Activity: Get plenty of rest for the remainder of the day. A responsible individual must stay with you for 24 hours following the procedure.  For the next 24 hours, DO NOT: -Drive a car -Paediatric nurse -Drink alcoholic beverages -Take any medication unless instructed by your physician -Make any legal decisions or sign important papers.  Meals: Start with liquid foods such as gelatin or soup. Progress to regular foods as tolerated. Avoid greasy, spicy, heavy foods. If nausea and/or vomiting occur, drink only clear liquids until the nausea and/or vomiting subsides. Call your physician if vomiting continues.  Special Instructions/Symptoms: Your throat may feel dry or sore from the anesthesia or the breathing tube placed in your throat during surgery. If this causes discomfort, gargle with warm salt water. The discomfort should disappear within 24 hours.  If you had a scopolamine patch placed behind your ear for the management of post- operative nausea and/or vomiting:  1. The medication in the patch is effective for 72 hours, after which it should be removed.  Wrap patch in a tissue and discard in the trash. Wash hands thoroughly with soap and water. 2. You may remove the patch earlier than 72 hours if you experience unpleasant side effects which may include dry mouth, dizziness or visual disturbances. 3. Avoid touching the patch. Wash your hands with soap and water after contact with the patch.

## 2017-09-20 NOTE — Op Note (Signed)
DATE OF PROCEDURE:  09/20/2017                              OPERATIVE REPORT  SURGEON:  Leta Baptist, MD  PREOPERATIVE DIAGNOSES: 1. Hoarseness 2. Anterior vocal cord mass  POSTOPERATIVE DIAGNOSES: 1. Hoarseness 2. Bilateral anterior vocal cord masses.  PROCEDURE PERFORMED:  Micro-direct laryngoscopy with excision of bilateral anterior vocal cord masses.  ANESTHESIA:  General endotracheal tube anesthesia.  COMPLICATIONS:  None.  ESTIMATED BLOOD LOSS:  Minimal.  INDICATION FOR PROCEDURE:  Lonnie Hansen is a 67 y.o. male with a history of chronic hoarseness and vocal cord polyps.  On examination, the patient was noted to have a fungating anterior vocal cord mass. Based on the above findings, the decision was made for the patient to undergo the micro-direct laryngoscopy procedure. Likelihood of success in reducing symptoms was also discussed.  The risks, benefits, alternatives, and details of the procedure were discussed with the patient.  Questions were invited and answered.  Informed consent was obtained.  DESCRIPTION:  The patient was taken to the operating room and placed supine on the operating table.  General endotracheal tube anesthesia was administered by the anesthesiologist.  The patient was positioned and prepped and draped in a standard fashion for direct laryngoscopy.  An anterior commissure scope was used for examination. The laryngoscope was inserted via the oral cavity into the pharynx. A small mucosal abrasionwas noted. Examination of the vallecula, epiglottis, aryepiglottic folds, and piriform sinuses were all normal. Examination of the larynx revealed a fungating mass at the anterior vocal cord. The laryngoscope was suspended with a Lewy suspender. Photodocumentation of the findings was obtained.   An operating microscope was brought into the field. Under the operating microscope, a 5 mm soft tissue mass was excised from the right anterior vocal cord with a pair of laryngeal  scissors. At this time, the patient was also noted to have a small soft tissue mass on the left anterior vocal cord. The left anterior vocal cord mass was also excised. Both specimens were sent to the pathology department for permanent histologic identification. Hemostasis was achieved with pledgets soaked with epinephrine.  The care of the patient was turned over to the anesthesiologist.  The patient was awakened from anesthesia without difficulty.  The patient was extubated and transferred to the recovery room in good condition.  OPERATIVE FINDINGS: Bilateral anterior vocal cord masses.  SPECIMEN:  Bilateral anterior vocal cord masses.  FOLLOWUP CARE:  The patient will be discharged home once awake and alert.   The patient will follow up in my office in approximately 1 week.  [name] W Jennica Tagliaferri 09/20/2017 12:12 PM

## 2017-09-20 NOTE — Anesthesia Preprocedure Evaluation (Signed)
Anesthesia Evaluation  Patient identified by MRN, date of birth, ID band Patient awake    Reviewed: Allergy & Precautions, H&P , NPO status , Patient's Chart, lab work & pertinent test results  Airway Mallampati: II  TM Distance: >3 FB Neck ROM: Full    Dental  (+) Dental Advisory Given, Partial Upper, Partial Lower, Missing,    Pulmonary neg pulmonary ROS, sleep apnea , former smoker,    breath sounds clear to auscultation       Cardiovascular Exercise Tolerance: Good hypertension, Pt. on medications and Pt. on home beta blockers + CAD  negative cardio ROS  + Valvular Problems/Murmurs AI  Rhythm:Regular Rate:Normal     Neuro/Psych  Headaches, negative neurological ROS  negative psych ROS   GI/Hepatic negative GI ROS, Neg liver ROS, GERD  Medicated,  Endo/Other  negative endocrine ROS  Renal/GU negative Renal ROS  negative genitourinary   Musculoskeletal  (+) Arthritis , Osteoarthritis,    Abdominal   Peds  Hematology negative hematology ROS (+)   Anesthesia Other Findings   Reproductive/Obstetrics negative OB ROS                             Anesthesia Physical  Anesthesia Plan  ASA: III  Anesthesia Plan: General   Post-op Pain Management:    Induction: Intravenous  PONV Risk Score and Plan: 2 and Midazolam, Treatment may vary due to age or medical condition and Ondansetron  Airway Management Planned: Oral ETT  Additional Equipment:   Intra-op Plan:   Post-operative Plan: Extubation in OR  Informed Consent: I have reviewed the patients History and Physical, chart, labs and discussed the procedure including the risks, benefits and alternatives for the proposed anesthesia with the patient or authorized representative who has indicated his/her understanding and acceptance.   Dental advisory given  Plan Discussed with: CRNA  Anesthesia Plan Comments:          Anesthesia Quick Evaluation

## 2017-09-20 NOTE — H&P (Signed)
Cc: Hoarseness, hearing loss  HPI: The patient is a 67 y/o male who presents today for evaluation of hoarseness and hearing loss. The patient is seen in consultation requested by Dr. [name]. The patient was diagnosed with vocal cord polyps several years ago. He was planning to have them removed but had to undergo open heart surgery. The patient continues to have significant hoarseness. No dysphagia or odynophagia is noted. The patient has a history of reflux but his symptoms are currently managed with diet modifications. The patient is a retired Armed forces technical officer. He also complains today of hearing loss. No otalgia, otorrhea, or vertigo is noted. Tobacco use is denied. No previous ENT surgery is noted.   The patient's review of systems (constitutional, eyes, ENT, cardiovascular, respiratory, GI, musculoskeletal, skin, neurologic, psychiatric, endocrine, hematologic, allergic) is noted in the ROS questionnaire.  It is reviewed with the patient.   Family health history: Heart disease, excessive bleeding.   Major events: Open heart  surgery 08/2016.   Ongoing medical problems: Reflux, heart disease, arthritis.   Social history: The patient is married. He denies the use of tobacco, alcohol or illegal drugs.   Exam:  General: Communicates without difficulty, well nourished, no acute distress. Raspy vocal quality. Head: Normocephalic, no evidence injury, no tenderness, facial buttresses intact without stepoff. Eyes: PERRL, EOMI.  No scleral icterus, conjunctivae clear. Ears: External auditory canals clear bilaterally.  There is no edema or erythema.  Tympanic membrane is within normal limits bilaterally. Nose: Normal skin and external support.  Anterior rhinoscopy reveals healthy pink mucosa over the septum and turbinates.  No lesions or polyps were seen. Oral cavity: Lips without lesions, oral mucosa moist, no masses or lesions seen. Indirect  mirror laryngoscopy could not be tolerated. Pharynx:  Clear, no erythema. Neck: Supple, full range of motion, no lymphadenopathy, no masses palpable. Salivary: Parotid and submandibular glands without mass. Neuro:  CN 2-12 grossly intact. Gait normal. Vestibular: No nystagmus at any point of gaze.   Procedure:  Flexible Fiberoptic Laryngoscopy -- Risks, benefits, and alternatives of flexible endoscopy were explained to the patient.  Specific mention was made of the risk of throat numbness with difficulty swallowing, possible bleeding from the nose and mouth, and pain from the procedure.  The patient gave oral consent to proceed.  The nasal cavities were decongested and anesthetised with a combination of oxymetazoline and 4% lidocaine solution.  The flexible scope was inserted into the right nasal cavity and advanced towards the nasopharynx.  Visualized mucosa over the turbinates and septum were as described above.  The nasopharynx was clear.  Oropharyngeal walls were symmetric and mobile without lesion, mass, or edema.  Hypopharynx was also without  lesion or edema.  Larynx was mobile without lesions. Supraglottic structures were free of edema, mass, and asymmetry.  True vocal folds were white with an anterior polyp noted.  Base of tongue was within normal limits.  The patient tolerated the procedure well.   AUDIOMETRIC TESTING: I have read and reviewed the audiometric test, which shows minimal bilateral high-frequency sensorineural hearing loss. The speech reception threshold is 5dB AD and 5dB AS.The discrimination score is 100% AD and 92% AS.   Assessment  1. An anterior vocal cord polyp is noted.  No other suspicious mass or lesion is noted on today's fiberoptic laryngoscopy exam. 2. The patient's ear canals, tympanic membranes and middle ear spaces are all normal.  3. Minimal bilateral high-frequency sensorineural hearing loss, consistent with presbycusis.   Plan: 1. The  physical exam, hearing test, and laryngoscopy findings are reviewed with the  patient. All questions and concerns are addressed.  2. Recommend Micro DL with excision of the polyp. The risks, benefits, alternatives, and details of the procedure are reviewed with the patient. Questions are invited and answered. 3. The patient is interested in proceeding with the procedure.  We will schedule the procedure in accordance with the family schedule.

## 2017-09-20 NOTE — Anesthesia Procedure Notes (Signed)
Procedure Name: Intubation Date/Time: 09/20/2017 11:51 AM Performed by: Maryella Shivers, CRNA Pre-anesthesia Checklist: Patient identified, Emergency Drugs available, Suction available and Patient being monitored Patient Re-evaluated:Patient Re-evaluated prior to induction Oxygen Delivery Method: Circle system utilized Preoxygenation: Pre-oxygenation with 100% oxygen Induction Type: IV induction Ventilation: Mask ventilation without difficulty Laryngoscope Size: Mac and 3 Grade View: Grade I Tube type: Oral Number of attempts: 1 Airway Equipment and Method: Stylet and Oral airway Placement Confirmation: ETT inserted through vocal cords under direct vision,  positive ETCO2 and breath sounds checked- equal and bilateral Secured at: 22 cm Tube secured with: Tape Dental Injury: Teeth and Oropharynx as per pre-operative assessment

## 2017-09-20 NOTE — Progress Notes (Signed)
Dr. Benjamine Mola notified of pt's platelet count 82 on Friday 09/17/17, will proceed with surgery as planned.

## 2017-09-20 NOTE — Transfer of Care (Signed)
Immediate Anesthesia Transfer of Care Note  Patient: Lonnie Hansen  Procedure(s) Performed: MICROLARYNGOSCOPY WITH  EXCISION OF VOCAL CORD LESION (Right Throat)  Patient Location: PACU  Anesthesia Type:General  Level of Consciousness: sedated  Airway & Oxygen Therapy: Patient Spontanous Breathing and Patient connected to face mask oxygen  Post-op Assessment: Report given to RN and Post -op Vital signs reviewed and stable  Post vital signs: Reviewed and stable  Last Vitals:  Vitals Value Taken Time  BP 133/77 09/20/2017 12:26 PM  Temp    Pulse 57 09/20/2017 12:28 PM  Resp 15 09/20/2017 12:28 PM  SpO2 98 % 09/20/2017 12:28 PM    Last Pain:  Vitals:   09/20/17 1001  TempSrc: Oral  PainSc: 0-No pain         Complications: No apparent anesthesia complications

## 2017-09-21 ENCOUNTER — Encounter (HOSPITAL_BASED_OUTPATIENT_CLINIC_OR_DEPARTMENT_OTHER): Payer: Self-pay | Admitting: Otolaryngology

## 2017-09-22 NOTE — Anesthesia Postprocedure Evaluation (Signed)
Anesthesia Post Note  Patient: Lonnie Hansen  Procedure(s) Performed: MICROLARYNGOSCOPY WITH  EXCISION OF VOCAL CORD LESION (Right Throat)     Patient location during evaluation: PACU Anesthesia Type: General Level of consciousness: awake and alert Pain management: pain level controlled Vital Signs Assessment: post-procedure vital signs reviewed and stable Respiratory status: spontaneous breathing, nonlabored ventilation, respiratory function stable and patient connected to nasal cannula oxygen Cardiovascular status: blood pressure returned to baseline and stable Postop Assessment: no apparent nausea or vomiting Anesthetic complications: no    Last Vitals:  Vitals:   09/20/17 1245 09/20/17 1333  BP: 123/86 138/81  Pulse: (!) 48 (!) 46  Resp: 11 18  Temp:  37.1 C  SpO2: 100% 97%    Last Pain:  Vitals:   09/20/17 1333  TempSrc:   PainSc: 0-No pain                 Genesis Paget

## 2017-09-28 DIAGNOSIS — D141 Benign neoplasm of larynx: Secondary | ICD-10-CM | POA: Diagnosis not present

## 2017-09-28 DIAGNOSIS — R490 Dysphonia: Secondary | ICD-10-CM | POA: Diagnosis not present

## 2017-10-25 ENCOUNTER — Encounter

## 2017-11-09 ENCOUNTER — Telehealth: Payer: Self-pay | Admitting: Cardiology

## 2017-11-09 DIAGNOSIS — I2510 Atherosclerotic heart disease of native coronary artery without angina pectoris: Secondary | ICD-10-CM

## 2017-11-09 DIAGNOSIS — H25011 Cortical age-related cataract, right eye: Secondary | ICD-10-CM | POA: Diagnosis not present

## 2017-11-09 DIAGNOSIS — H2511 Age-related nuclear cataract, right eye: Secondary | ICD-10-CM | POA: Diagnosis not present

## 2017-11-09 DIAGNOSIS — H25013 Cortical age-related cataract, bilateral: Secondary | ICD-10-CM | POA: Diagnosis not present

## 2017-11-09 DIAGNOSIS — H2513 Age-related nuclear cataract, bilateral: Secondary | ICD-10-CM | POA: Diagnosis not present

## 2017-11-09 DIAGNOSIS — H401231 Low-tension glaucoma, bilateral, mild stage: Secondary | ICD-10-CM | POA: Diagnosis not present

## 2017-11-09 DIAGNOSIS — I351 Nonrheumatic aortic (valve) insufficiency: Secondary | ICD-10-CM

## 2017-11-09 DIAGNOSIS — H43812 Vitreous degeneration, left eye: Secondary | ICD-10-CM | POA: Diagnosis not present

## 2017-11-09 DIAGNOSIS — E785 Hyperlipidemia, unspecified: Secondary | ICD-10-CM

## 2017-11-09 NOTE — Telephone Encounter (Signed)
New Message         Patient is calling today to see if he needs to do blood work before his visit on 11/12/2017. Patient has an appointment, so ok to leave an appt.

## 2017-11-09 NOTE — Telephone Encounter (Deleted)
Refilled amlodipine per pt request

## 2017-11-09 NOTE — Telephone Encounter (Signed)
Patient called to see if he needs any lab work before next visit on 11/12/17

## 2017-11-10 NOTE — Telephone Encounter (Signed)
Spoke to patient advised he does need fasting lipid panel and cmet done.Stated he will have done tomorrow at [facility] office [place].

## 2017-11-10 NOTE — Addendum Note (Signed)
Addended by: Kathyrn Lass on: 11/10/2017 02:49 PM   Modules accepted: Orders

## 2017-11-11 DIAGNOSIS — I2510 Atherosclerotic heart disease of native coronary artery without angina pectoris: Secondary | ICD-10-CM | POA: Diagnosis not present

## 2017-11-11 DIAGNOSIS — I351 Nonrheumatic aortic (valve) insufficiency: Secondary | ICD-10-CM | POA: Diagnosis not present

## 2017-11-11 DIAGNOSIS — E785 Hyperlipidemia, unspecified: Secondary | ICD-10-CM | POA: Diagnosis not present

## 2017-11-11 NOTE — Progress Notes (Signed)
Cardiology Office Note    Date:  11/12/2017   ID:  Lonnie Hansen, DOB 02-17-1950, MRN [id number]  PCP:  [name], MD  Cardiologist:  Dr. Martinique   Chief Complaint  Patient presents with  . Follow-up    no chest pain  . Coronary Artery Disease    History of Present Illness:  Lonnie Hansen is a 67 y.o. 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 In 2017 chest x-ray showed aortic root enlargement. He underwent CT that showed dilatation of proximal aorta. Descending aorta was normal. Echocardiogram obtained on 09/20/2015 showed EF 60-65%, moderate to severe AI, grade 1 DD. He was evaluated by Dr. Servando Snare. More recently, CT scan showed aortic root had enlarged to 5.2 cm. Given the size of aortic root and the degree of AI, it was recommended he undergo aortic root grafting and AVR. He underwent preoperative evaluation with cardiac catheterization on 04/14/2016 which showed moderate to severe AI, EF 45%, 80% stenosis in a nondominant RCA, 50% distal LAD. He eventually underwent the planned procedure with ascending aortic root replacement using 32 mm Gelweave Valsalva Graft and AVR using 29 mm Edwards Perimount Magna Ease aortic bioprosthesis valve on 09/15/2016 by Dr. Servando Snare.  Postoperatively, he did have some volume overload that responded well to diuretic. He also had postoperative thrombocytopenia as well. He was placed on oral amiodarone for postoperative atrial fibrillation.  When seen in follow up chest x-ray showed recurrent right pleural effusion.Echo on 10/09/2016 showed only a trivial amount of pericardial effusion, EF 60-65%. He underwent right thoracentesis again on 10/23/2016 with the removal 2 L of dark blood. Post procedure, he developed a right pneumothorax related to incomplete reexpansion of the right middle and lower lobe of the lung. He was admitted on 10/30/2016 for pigtail drain placement, last chest  x-ray obtained on 11/04/2016 showed improvement of right pleural effusion and resolution of right pneumothorax. Followed by Dr. Servando Snare - Repeat CT chest in April stable.   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   On follow up today he is doing well. He is active playing music. He works out  going to [employer] on other days. He denies any chest pain, SOB, palpitations, dizziness, or edema. He still has numbness in 3 lateral fingers on right hand. Notes some cough in am. Wears CPAP. Feels energy is good. He is eating healthy. Has not been taking lasix but has no edema. Is having cataract surgery in November. May need hip surgery.   Past Medical History:  Diagnosis Date  . Anemia   . Arthritis   . Asymptomatic bilateral carotid artery stenosis 08/2015   1-39%   . Blood dyscrasia    "trouble with my bloods clotting"  . Bruising    on the skin states due to platelets or sometimes low or high  . Cataracts, bilateral   . Coronary artery disease   . Diverticulosis   . Enlarged aorta ([place])   . Enlarged prostate    slightly  . GERD (gastroesophageal reflux disease)    takes Pantoprazole daily as needed  . Glaucoma    uses eye drops daily  . Headache   . History of colon polyps    benign  . History of kidney stones   . Hyperlipidemia    no on any meds  . Hypertension    takes Amlodipine and Atenolol daily  .  Joint pain   . Joint swelling   . Nocturia   . OSA on CPAP   . Sleep apnea   . Vocal cord nodule    pt. states  it's a" growth on vocal cord"    Past Surgical History:  Procedure Laterality Date  . AORTIC ARCH ANGIOGRAPHY N/A 04/16/2016   Procedure: Aortic Arch Angiography;  Surgeon: Chimamanda Siegfried M Martinique, MD;  Location: [facility];  Service: Cardiovascular;  Laterality: N/A;  . AORTIC VALVE REPLACEMENT N/A 09/15/2016   Procedure: AORTIC VALVE REPLACEMENT (AVR);  Surgeon: Grace Isaac, MD;  Location: [facility];  Service: Open Heart Surgery;  Laterality: N/A;   Using 64mm Edwards Perimount Magna Ease Aortic Bioprosthesis Valve  . ASCENDING AORTIC ROOT REPLACEMENT N/A 09/15/2016   Procedure: ASCENDING AORTIC ROOT REPLACEMENT;  Surgeon: Grace Isaac, MD;  Location: [facility];  Service: Open Heart Surgery;  Laterality: N/A;  Using 12mm Gelweave Valsalva Graft  . COLONOSCOPY    . COLONOSCOPY WITH ESOPHAGOGASTRODUODENOSCOPY (EGD)    . IR THORACENTESIS ASP PLEURAL SPACE W/IMG GUIDE  10/23/2016  . MICROLARYNGOSCOPY Right 09/20/2017   Procedure: MICROLARYNGOSCOPY WITH  EXCISION OF VOCAL CORD LESION;  Surgeon: Leta Baptist, MD;  Location: [facility];  Service: ENT;  Laterality: Right;  . MULTIPLE EXTRACTIONS WITH ALVEOLOPLASTY N/A 06/10/2016   Procedure: Extraction of tooth #'s 2,8,13,15, and 29  with alveoloplasty, maxillary right and left buccal exostoses reductions, and gross debridement of remaining teeth.;  Surgeon: Lenn Cal, DDS;  Location: [facility];  Service: Oral Surgery;  Laterality: N/A;  . RIGHT/LEFT HEART CATH AND CORONARY ANGIOGRAPHY N/A 04/16/2016   Procedure: Right/Left Heart Cath and Coronary Angiography;  Surgeon: Shirely Toren M Martinique, MD;  Location: [facility];  Service: Cardiovascular;  Laterality: N/A;  . TEE WITHOUT CARDIOVERSION N/A 09/15/2016   Procedure: TRANSESOPHAGEAL ECHOCARDIOGRAM (TEE);  Surgeon: Grace Isaac, MD;  Location: [facility];  Service: Open Heart Surgery;  Laterality: N/A;    Current Medications: Outpatient Medications Prior to Visit  Medication Sig Dispense Refill  . amLODipine (NORVASC) 10 MG tablet Take 10 mg by mouth daily.    Marland Kitchen atenolol (TENORMIN) 25 MG tablet Take 0.5 tablets (12.5 mg total) by mouth 2 (two) times daily. 30 tablet 1  . Ferrous Sulfate (IRON) 325 (65 Fe) MG TABS Take 1 tablet by mouth daily.    Marland Kitchen latanoprost (XALATAN) 0.005 % ophthalmic solution Place 1 drop into both eyes at bedtime.    . Multiple Vitamin (MULTIVITAMIN WITH MINERALS) TABS tablet Take 1 tablet by mouth daily.    .  rosuvastatin (CRESTOR) 10 MG tablet Take 1 tablet (10 mg total) by mouth daily. 90 tablet 3  . aspirin EC 81 MG EC tablet Take 1 tablet (81 mg total) by mouth daily. (Patient not taking: Reported on 11/12/2017)    . furosemide (LASIX) 20 MG tablet TAKE 1 TABLET BY MOUTH ONCE DAILY (Patient not taking: Reported on 11/12/2017) 90 tablet 3   No facility-administered medications prior to visit.      Allergies:   Patient has no known allergies.   Social History   Socioeconomic History  . Marital status: Married    Spouse name: Not on file  . Number of children: 1  . Years of education: Not on file  . Highest education level: Not on file  Occupational History  . Occupation: Musician  Social Needs  . Financial resource strain: Not on file  . Food insecurity:  Worry: Not on file    Inability: Not on file  . Transportation needs:    Medical: Not on file    Non-medical: Not on file  Tobacco Use  . Smoking status: Former Smoker    Packs/day: 1.00    Years: 25.00    Pack years: 25.00    Types: Cigarettes  . Smokeless tobacco: Never Used  Substance and Sexual Activity  . Alcohol use: No    Alcohol/week: 0.0 standard drinks  . Drug use: No  . Sexual activity: Not on file  Lifestyle  . Physical activity:    Days per week: Not on file    Minutes per session: Not on file  . Stress: Not on file  Relationships  . Social connections:    Talks on phone: Not on file    Gets together: Not on file    Attends religious service: Not on file    Active member of club or organization: Not on file    Attends meetings of clubs or organizations: Not on file    Relationship status: Not on file  Other Topics Concern  . 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     Family History:  The patient's family history includes Heart attack in his father; Heart disease in his father; Thyroid disease in his sister.   ROS:   Please see the history  of present illness.    ROS All other systems reviewed and are negative.   PHYSICAL EXAM:   VS:  BP 128/76   Pulse 70   Ht 5\' 8"  (1.727 m)   Wt 198 lb (89.8 kg)   BMI 30.11 kg/m    GENERAL:  Well appearing BM in NAD. Walks with a cane. HEENT:  PERRL, EOMI, sclera are clear. Oropharynx is clear. NECK:  No jugular venous distention, carotid upstroke brisk and symmetric, no bruits, no thyromegaly or adenopathy LUNGS:  Clear to auscultation bilaterally CHEST:  Unremarkable HEART:  RRR,  PMI not displaced or sustained,S1 and S2 within normal limits, no S3, no S4: no clicks, no rubs, no murmurs ABD:  Soft, nontender. BS +, no masses or bruits. No hepatomegaly, no splenomegaly EXT:  2 + pulses throughout, no edema, no cyanosis no clubbing SKIN:  Warm and dry.  No rashes NEURO:  Alert and oriented x 3. Cranial nerves II through XII intact. PSYCH:  Cognitively intact      Wt Readings from Last 3 Encounters:  11/12/17 198 lb (89.8 kg)  09/20/17 189 lb 9.5 oz (86 kg)  05/31/17 194 lb 3.2 oz (88.1 kg)      Studies/Labs Reviewed:   EKG:  EKG is  ordered today. NSR with PVCs. Nonspecific ST T wave abnormality. I have personally reviewed and interpreted this study.    Recent Labs: 09/17/2017: Hemoglobin 12.7; Platelets 82 11/11/2017: ALT 9; BUN 14; Creatinine, Ser 0.97; Potassium 3.9; Sodium 141   Lipid Panel    Component Value Date/Time   CHOL 109 11/11/2017 1224   TRIG 135 11/11/2017 1224   HDL 35 (L) 11/11/2017 1224   CHOLHDL 4.0 10/07/2016 0812   CHOLHDL 3.5 04/28/2010 0703   VLDL 14 04/28/2010 0703   LDLCALC 47 11/11/2017 1224    Additional studies/ records that were reviewed today include:   Echo 09/20/2015 LV EF: 60% - 65%  Study Conclusions  - Left ventricle: The cavity size was normal. There was  moderate focal basal hypertrophy of the septum. Systolic function was normal. The estimated ejection fraction was in the range of 60% to 65%. Wall motion was  normal; there were no regional wall motion abnormalities. Doppler parameters are consistent with abnormal left ventricular relaxation (grade 1 diastolic dysfunction). - Aortic valve: There was moderate to severe regurgitation. - Aorta: Aortic root dimension: 43 mm (ED). - Ascending aorta: The ascending aorta was mildly dilated. - Right ventricle: The cavity size was mildly dilated. Wall thickness was normal.    Cath 04/16/2016 Conclusion     Mid RCA lesion, 80 %stenosed.  Ost LAD to Prox LAD lesion, 25 %stenosed.  Dist LAD lesion, 50 %stenosed.  There is mild left ventricular systolic dysfunction.  LV end diastolic pressure is normal.  The left ventricular ejection fraction is 45-50% by visual estimate.  There is no mitral valve regurgitation.  There is no aortic valve stenosis.  LV end diastolic pressure is normal.  1. 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 Mild LV dysfunction. EF 45%. 3. Ascending thoracic aortic aneurysm. 4. 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 5. Normal Right heart And LV filling pressures 6. Normal cardiac output.   Plan: Aortic root grafting and AVR.      Echo 09/16/2016 LV EF: 55% - 60%  Study Conclusions  - Left ventricle: The cavity size was normal. Wall thickness was increased in a pattern of severe LVH. Systolic function was normal. The estimated ejection fraction was in the range of 55% to 60%. - Aortic valve: Valve area (VTI): 4.29 cm^2. Valve area (Vmax): 4.15 cm^2. Valve area (Vmean): 3.88 cm^2. - Mitral valve: Severely calcified annulus. Valve area by continuity equation (using LVOT flow): 3.35 cm^2. - Pericardium, extracardiac: Localized moderate pericardial effusion posterior to the LA.    Limited Echo 10/09/2016 LV EF: 60% -   65%  Study Conclusions  - Left ventricle: The cavity size was normal. Wall thickness was   increased in a pattern of mild LVH. Systolic  function was normal.   The estimated ejection fraction was in the range of 60% to 65%. - Aortic valve: AV prosthesis appears to open well Peak and mean   gradients through the valve are 9 and 5 mm Hg respectively. - Mitral valve: Calcified annulus. Mildly thickened leaflets .   There was mild regurgitation. - Pulmonary arteries: PA peak pressure: 37 mm Hg (S). - Pericardium, extracardiac: A trivial pericardial effusion was   identified.   R thoracentesis 10/23/2016 Post CXR: IMPRESSION: Resolution of right-sided pleural effusion. Right pneumothorax is noted related to incomplete re-expansion of the right middle and lower lobes. This does not represent a true pneumothorax. Continued follow-up is recommended.  CT ANGIOGRAPHY CHEST WITH CONTRAST  TECHNIQUE: Multidetector CT imaging of the chest was performed using the standard protocol during bolus administration of intravenous contrast. Multiplanar CT image reconstructions and MIPs were obtained to evaluate the vascular anatomy.  CONTRAST:  85mL ISOVUE-370 IOPAMIDOL (ISOVUE-370) INJECTION 76%  COMPARISON:  03/26/2016, 01/30/2014  FINDINGS: Cardiovascular: There are changes consistent with aortic valve replacement. Coronary calcifications are noted. Aortic root repair is noted as well. At the level of the sinus of Valsalva aorta measures approximately 4.2 cm. The ascending component is widely patent consistent with the repair. The thoracic arch and descending thoracic aorta show atherosclerotic change. A focal outpouching associated with some soft atherosclerotic plaque is noted best seen on image number 96 of series 16 and image number 47 of series 7. This is  slightly more prominent than on the previous preoperative exam. No dissection is noted. No significant cardiac enlargement is seen.  Mediastinum/Nodes: Thoracic inlet is within normal limits. No significant hilar or mediastinal adenopathy is noted. The  esophagus is within normal limits.  Lungs/Pleura: The lungs are well aerated bilaterally. No focal infiltrate or sizable effusion is seen. No parenchymal nodules are noted.  Upper Abdomen: Visualized upper abdomen demonstrates a large right parapelvic cyst as well as a large lamellated gallstone. Stable partially calcified partially cystic lesion in the left kidney is again seen similar to that noted in 2016.  Musculoskeletal: Mild degenerative changes of the thoracic spine are seen. No acute bony abnormality is noted. Prior median sternotomy is seen.  Review of the MIP images confirms the above findings.  IMPRESSION: Status post aortic valve replacement and ascending aortic repair without complicating factors.  Focal mild outpouching associated with some soft atherosclerotic plaque in the proximal descending thoracic aorta. This is slightly more prominent than that seen on the prior exam. Attention on follow-up examinations is recommended. No true dissection is seen.  Stable changes in the upper abdomen without complication.   Electronically Signed   By: Inez Catalina M.D.   On: 05/04/2017 13:49  ASSESSMENT:    1. Coronary artery disease involving native coronary artery of native heart without angina pectoris   2. S/P AVR (aortic valve replacement)   3. H/O aortic root repair   4. Essential hypertension      PLAN:  In order of problems listed above:  1. H/o Aortic root repair and AVR with a tissue valve.: clinically he has made a complete recovery.  Repeat Echo in September 2018 showed good prosthetic valve function. Repeat CT in April showed mild dilation of the descending aorta but normal graft.   2. Recurrent right pleural effusion- resolved. May stay off lasix.   3. CAD: No anginal pain, 80% mid nondominant RCA and a 50% distal LAD lesion on previous cardiac catheterization.  Continue medical therapy.   4. Hypertension: Blood pressure is under  excellent control.  5. Hyperlipidemia: excellent response to Crestor. Continue.     Medication Adjustments/Labs and Tests Ordered: Current medicines are reviewed at length with the patient today.  Concerns regarding medicines are outlined above.  Medication changes, Labs and Tests ordered today are listed in the Patient Instructions below. Patient Instructions  Stop taking lasix  Continue your other therapy      Signed, Chisom Aust Martinique, MD  11/12/2017 4:05 PM    [facility] [address], [place], [place]  [address] Phone: [phone]; Fax: [phone]

## 2017-11-12 ENCOUNTER — Ambulatory Visit (INDEPENDENT_AMBULATORY_CARE_PROVIDER_SITE_OTHER): Payer: Medicare Other | Admitting: Cardiology

## 2017-11-12 ENCOUNTER — Encounter: Payer: Self-pay | Admitting: Cardiology

## 2017-11-12 VITALS — BP 128/76 | HR 70 | Ht 68.0 in | Wt 198.0 lb

## 2017-11-12 DIAGNOSIS — Z952 Presence of prosthetic heart valve: Secondary | ICD-10-CM | POA: Diagnosis not present

## 2017-11-12 DIAGNOSIS — I2510 Atherosclerotic heart disease of native coronary artery without angina pectoris: Secondary | ICD-10-CM | POA: Diagnosis not present

## 2017-11-12 DIAGNOSIS — I10 Essential (primary) hypertension: Secondary | ICD-10-CM | POA: Diagnosis not present

## 2017-11-12 DIAGNOSIS — Z9889 Other specified postprocedural states: Secondary | ICD-10-CM | POA: Diagnosis not present

## 2017-11-12 LAB — LIPID PANEL W/O CHOL/HDL RATIO
CHOLESTEROL TOTAL: 109 mg/dL (ref 100–199)
HDL: 35 mg/dL — ABNORMAL LOW (ref >39)
LDL Calculated: 47 mg/dL (ref 0–99)
Triglycerides: 135 mg/dL (ref 0–149)
VLDL Cholesterol Cal: 27 mg/dL (ref 5–40)

## 2017-11-12 LAB — COMPREHENSIVE METABOLIC PANEL
A/G RATIO: 1.9 (ref 1.2–2.2)
ALT: 9 IU/L (ref 0–44)
AST: 19 IU/L (ref 0–40)
Albumin: 4.7 g/dL (ref 3.6–4.8)
Alkaline Phosphatase: 50 IU/L (ref 39–117)
BUN / CREAT RATIO: 14 (ref 10–24)
BUN: 14 mg/dL (ref 8–27)
Bilirubin Total: 0.7 mg/dL (ref 0.0–1.2)
CALCIUM: 9.3 mg/dL (ref 8.6–10.2)
CO2: 26 mmol/L (ref 20–29)
Chloride: 99 mmol/L (ref 96–106)
Creatinine, Ser: 0.97 mg/dL (ref 0.76–1.27)
GFR calc Af Amer: 92 mL/min/{1.73_m2} (ref >59)
GFR, EST NON AFRICAN AMERICAN: 80 mL/min/{1.73_m2} (ref >59)
GLOBULIN, TOTAL: 2.5 g/dL (ref 1.5–4.5)
Glucose: 89 mg/dL (ref 65–99)
POTASSIUM: 3.9 mmol/L (ref 3.5–5.2)
SODIUM: 141 mmol/L (ref 134–144)
TOTAL PROTEIN: 7.2 g/dL (ref 6.0–8.5)

## 2017-11-12 NOTE — Patient Instructions (Signed)
Stop taking lasix  Continue your other therapy

## 2017-12-15 DIAGNOSIS — H401211 Low-tension glaucoma, right eye, mild stage: Secondary | ICD-10-CM | POA: Diagnosis not present

## 2017-12-15 DIAGNOSIS — H401111 Primary open-angle glaucoma, right eye, mild stage: Secondary | ICD-10-CM | POA: Diagnosis not present

## 2017-12-15 DIAGNOSIS — H2511 Age-related nuclear cataract, right eye: Secondary | ICD-10-CM | POA: Diagnosis not present

## 2017-12-15 DIAGNOSIS — H25811 Combined forms of age-related cataract, right eye: Secondary | ICD-10-CM | POA: Diagnosis not present

## 2017-12-20 DIAGNOSIS — H25012 Cortical age-related cataract, left eye: Secondary | ICD-10-CM | POA: Diagnosis not present

## 2017-12-20 DIAGNOSIS — H2512 Age-related nuclear cataract, left eye: Secondary | ICD-10-CM | POA: Diagnosis not present

## 2017-12-21 DIAGNOSIS — R82998 Other abnormal findings in urine: Secondary | ICD-10-CM | POA: Diagnosis not present

## 2017-12-21 DIAGNOSIS — Z125 Encounter for screening for malignant neoplasm of prostate: Secondary | ICD-10-CM | POA: Diagnosis not present

## 2017-12-21 DIAGNOSIS — E7849 Other hyperlipidemia: Secondary | ICD-10-CM | POA: Diagnosis not present

## 2017-12-21 DIAGNOSIS — Z23 Encounter for immunization: Secondary | ICD-10-CM | POA: Diagnosis not present

## 2017-12-21 DIAGNOSIS — I10 Essential (primary) hypertension: Secondary | ICD-10-CM | POA: Diagnosis not present

## 2017-12-22 ENCOUNTER — Telehealth: Payer: Self-pay | Admitting: Cardiology

## 2017-12-22 ENCOUNTER — Other Ambulatory Visit: Payer: Self-pay | Admitting: Physician Assistant

## 2017-12-22 MED ORDER — ROSUVASTATIN CALCIUM 10 MG PO TABS: 10.0000 mg | ORAL_TABLET | Freq: Every day | ORAL | 3 refills | Status: DC

## 2017-12-22 NOTE — Telephone Encounter (Signed)
New Message    *STAT* If patient is at the pharmacy, call can be transferred to refill team.   1. Which medications need to be refilled? (please list name of each medication and dose if known) rosuvastatin (CRESTOR) 10 MG tablet  2. Which pharmacy/location (including street and city if local pharmacy) is medication to be sent to? Take 1 tablet (10 mg total) by mouth daily  3. Do they need a 30 day or 90 day supply? [place]

## 2017-12-22 NOTE — Telephone Encounter (Signed)
rx sent to pharmacy

## 2017-12-28 DIAGNOSIS — M545 Low back pain: Secondary | ICD-10-CM | POA: Diagnosis not present

## 2017-12-28 DIAGNOSIS — D696 Thrombocytopenia, unspecified: Secondary | ICD-10-CM | POA: Diagnosis not present

## 2017-12-28 DIAGNOSIS — Z1389 Encounter for screening for other disorder: Secondary | ICD-10-CM | POA: Diagnosis not present

## 2017-12-28 DIAGNOSIS — R351 Nocturia: Secondary | ICD-10-CM | POA: Diagnosis not present

## 2017-12-28 DIAGNOSIS — M25551 Pain in right hip: Secondary | ICD-10-CM | POA: Diagnosis not present

## 2017-12-28 DIAGNOSIS — Z0000 Encounter for general adult medical examination without abnormal findings: Secondary | ICD-10-CM | POA: Diagnosis not present

## 2017-12-28 DIAGNOSIS — G4733 Obstructive sleep apnea (adult) (pediatric): Secondary | ICD-10-CM | POA: Diagnosis not present

## 2017-12-28 DIAGNOSIS — I2510 Atherosclerotic heart disease of native coronary artery without angina pectoris: Secondary | ICD-10-CM | POA: Diagnosis not present

## 2017-12-28 DIAGNOSIS — R490 Dysphonia: Secondary | ICD-10-CM | POA: Diagnosis not present

## 2017-12-28 DIAGNOSIS — R3121 Asymptomatic microscopic hematuria: Secondary | ICD-10-CM | POA: Diagnosis not present

## 2017-12-28 DIAGNOSIS — Z6831 Body mass index (BMI) 31.0-31.9, adult: Secondary | ICD-10-CM | POA: Diagnosis not present

## 2017-12-28 DIAGNOSIS — I10 Essential (primary) hypertension: Secondary | ICD-10-CM | POA: Diagnosis not present

## 2017-12-28 DIAGNOSIS — D141 Benign neoplasm of larynx: Secondary | ICD-10-CM | POA: Diagnosis not present

## 2017-12-28 DIAGNOSIS — E7849 Other hyperlipidemia: Secondary | ICD-10-CM | POA: Diagnosis not present

## 2018-01-04 DIAGNOSIS — Z1212 Encounter for screening for malignant neoplasm of rectum: Secondary | ICD-10-CM | POA: Diagnosis not present

## 2018-01-14 ENCOUNTER — Other Ambulatory Visit: Payer: Self-pay

## 2018-01-17 ENCOUNTER — Telehealth: Payer: Self-pay | Admitting: Hematology & Oncology

## 2018-01-17 NOTE — Telephone Encounter (Signed)
Spoke with patient to confirm new patient appt 02/11/18 at 130 pm. appt letter mailed

## 2018-01-24 DIAGNOSIS — R351 Nocturia: Secondary | ICD-10-CM | POA: Diagnosis not present

## 2018-01-24 DIAGNOSIS — R3121 Asymptomatic microscopic hematuria: Secondary | ICD-10-CM | POA: Diagnosis not present

## 2018-01-24 DIAGNOSIS — R3912 Poor urinary stream: Secondary | ICD-10-CM | POA: Diagnosis not present

## 2018-01-24 DIAGNOSIS — N401 Enlarged prostate with lower urinary tract symptoms: Secondary | ICD-10-CM | POA: Diagnosis not present

## 2018-02-02 DIAGNOSIS — H401221 Low-tension glaucoma, left eye, mild stage: Secondary | ICD-10-CM | POA: Diagnosis not present

## 2018-02-02 DIAGNOSIS — H25812 Combined forms of age-related cataract, left eye: Secondary | ICD-10-CM | POA: Diagnosis not present

## 2018-02-02 DIAGNOSIS — H2512 Age-related nuclear cataract, left eye: Secondary | ICD-10-CM | POA: Diagnosis not present

## 2018-02-10 ENCOUNTER — Telehealth: Payer: Self-pay | Admitting: Hematology & Oncology

## 2018-02-10 NOTE — Telephone Encounter (Signed)
Spoke with patient regarding appointments being r/s from 1/17 due to Dr [name] on call this date. Letter/calendar mailed as well

## 2018-02-11 ENCOUNTER — Ambulatory Visit: Payer: Self-pay | Admitting: Hematology & Oncology

## 2018-02-11 ENCOUNTER — Other Ambulatory Visit: Payer: Self-pay

## 2018-02-15 DIAGNOSIS — R030 Elevated blood-pressure reading, without diagnosis of hypertension: Secondary | ICD-10-CM | POA: Diagnosis not present

## 2018-02-16 DIAGNOSIS — R030 Elevated blood-pressure reading, without diagnosis of hypertension: Secondary | ICD-10-CM | POA: Diagnosis not present

## 2018-03-02 ENCOUNTER — Other Ambulatory Visit: Payer: Self-pay

## 2018-03-02 ENCOUNTER — Telehealth: Payer: Self-pay | Admitting: Hematology & Oncology

## 2018-03-02 ENCOUNTER — Ambulatory Visit: Payer: Self-pay | Admitting: Hematology & Oncology

## 2018-03-02 NOTE — Telephone Encounter (Signed)
Patient showed up 45 mins late for his appointment per Karma Greaser .  He stated that he did not even look at the calendar that was mailed(in which he brought in with him today) appointments were r/s to 3/5.  New calendar was given to patient

## 2018-03-02 NOTE — Telephone Encounter (Signed)
I ended up having to speak with patient regarding his missed appointment for today. He had written down the incorrect time for today's appointment.  I explained to him that Dr [name] was running behind and there was no way he could work him in today. He then asked if he could see another provider. I explained that the other provider that was here had no open appointments either.  I also advised him that he need to be sure to always look at all info that we send to him to be sure of appointment dates/times.  He left the office with new appt da/time CALENDAR in HAND.

## 2018-03-03 DIAGNOSIS — N401 Enlarged prostate with lower urinary tract symptoms: Secondary | ICD-10-CM | POA: Diagnosis not present

## 2018-03-03 DIAGNOSIS — R350 Frequency of micturition: Secondary | ICD-10-CM | POA: Diagnosis not present

## 2018-03-03 DIAGNOSIS — R3121 Asymptomatic microscopic hematuria: Secondary | ICD-10-CM | POA: Diagnosis not present

## 2018-03-28 DIAGNOSIS — I10 Essential (primary) hypertension: Secondary | ICD-10-CM | POA: Diagnosis not present

## 2018-03-28 DIAGNOSIS — M25551 Pain in right hip: Secondary | ICD-10-CM | POA: Diagnosis not present

## 2018-03-28 DIAGNOSIS — E7849 Other hyperlipidemia: Secondary | ICD-10-CM | POA: Diagnosis not present

## 2018-03-28 DIAGNOSIS — Z833 Family history of diabetes mellitus: Secondary | ICD-10-CM | POA: Diagnosis not present

## 2018-03-28 DIAGNOSIS — Z6831 Body mass index (BMI) 31.0-31.9, adult: Secondary | ICD-10-CM | POA: Diagnosis not present

## 2018-03-28 DIAGNOSIS — D696 Thrombocytopenia, unspecified: Secondary | ICD-10-CM | POA: Diagnosis not present

## 2018-03-28 DIAGNOSIS — Z1331 Encounter for screening for depression: Secondary | ICD-10-CM | POA: Diagnosis not present

## 2018-03-28 DIAGNOSIS — G4733 Obstructive sleep apnea (adult) (pediatric): Secondary | ICD-10-CM | POA: Diagnosis not present

## 2018-03-31 ENCOUNTER — Telehealth: Payer: Self-pay | Admitting: Hematology & Oncology

## 2018-03-31 ENCOUNTER — Encounter: Payer: Self-pay | Admitting: Hematology & Oncology

## 2018-03-31 ENCOUNTER — Inpatient Hospital Stay (HOSPITAL_BASED_OUTPATIENT_CLINIC_OR_DEPARTMENT_OTHER): Payer: Medicare Other | Admitting: Hematology & Oncology

## 2018-03-31 ENCOUNTER — Inpatient Hospital Stay: Payer: Medicare Other | Attending: Hematology & Oncology

## 2018-03-31 ENCOUNTER — Other Ambulatory Visit: Payer: Self-pay

## 2018-03-31 VITALS — BP 150/83 | HR 55 | Temp 98.8°F | Resp 18 | Wt 211.8 lb

## 2018-03-31 DIAGNOSIS — M25551 Pain in right hip: Secondary | ICD-10-CM | POA: Insufficient documentation

## 2018-03-31 DIAGNOSIS — I712 Thoracic aortic aneurysm, without rupture: Secondary | ICD-10-CM

## 2018-03-31 DIAGNOSIS — D691 Qualitative platelet defects: Secondary | ICD-10-CM | POA: Insufficient documentation

## 2018-03-31 DIAGNOSIS — D631 Anemia in chronic kidney disease: Secondary | ICD-10-CM

## 2018-03-31 DIAGNOSIS — Z992 Dependence on renal dialysis: Secondary | ICD-10-CM

## 2018-03-31 DIAGNOSIS — Z79899 Other long term (current) drug therapy: Secondary | ICD-10-CM | POA: Insufficient documentation

## 2018-03-31 DIAGNOSIS — Z7982 Long term (current) use of aspirin: Secondary | ICD-10-CM | POA: Diagnosis not present

## 2018-03-31 DIAGNOSIS — I10 Essential (primary) hypertension: Secondary | ICD-10-CM | POA: Insufficient documentation

## 2018-03-31 DIAGNOSIS — M1990 Unspecified osteoarthritis, unspecified site: Secondary | ICD-10-CM

## 2018-03-31 DIAGNOSIS — D693 Immune thrombocytopenic purpura: Secondary | ICD-10-CM | POA: Insufficient documentation

## 2018-03-31 DIAGNOSIS — Z87891 Personal history of nicotine dependence: Secondary | ICD-10-CM | POA: Insufficient documentation

## 2018-03-31 DIAGNOSIS — N186 End stage renal disease: Principal | ICD-10-CM

## 2018-03-31 HISTORY — DX: Immune thrombocytopenic purpura: D69.3

## 2018-03-31 LAB — SAVE SMEAR(SSMR), FOR PROVIDER SLIDE REVIEW

## 2018-03-31 LAB — CBC WITH DIFFERENTIAL (CANCER CENTER ONLY)
Abs Immature Granulocytes: 0.04 10*3/uL (ref 0.00–0.07)
BASOS ABS: 0 10*3/uL (ref 0.0–0.1)
Basophils Relative: 0 %
EOS PCT: 3 %
Eosinophils Absolute: 0.2 10*3/uL (ref 0.0–0.5)
HCT: 37.2 % — ABNORMAL LOW (ref 39.0–52.0)
HEMOGLOBIN: 12.4 g/dL — AB (ref 13.0–17.0)
IMMATURE GRANULOCYTES: 1 %
LYMPHS ABS: 1.2 10*3/uL (ref 0.7–4.0)
Lymphocytes Relative: 15 %
MCH: 31.1 pg (ref 26.0–34.0)
MCHC: 33.3 g/dL (ref 30.0–36.0)
MCV: 93.2 fL (ref 80.0–100.0)
Monocytes Absolute: 0.9 10*3/uL (ref 0.1–1.0)
Monocytes Relative: 12 %
NEUTROS ABS: 5.3 10*3/uL (ref 1.7–7.7)
NEUTROS PCT: 69 %
NRBC: 0 % (ref 0.0–0.2)
Platelet Count: 103 10*3/uL — ABNORMAL LOW (ref 150–400)
RBC: 3.99 MIL/uL — AB (ref 4.22–5.81)
RDW: 12.7 % (ref 11.5–15.5)
WBC: 7.5 10*3/uL (ref 4.0–10.5)

## 2018-03-31 LAB — PLATELET BY CITRATE

## 2018-03-31 MED ORDER — TRAMADOL HCL 50 MG PO TABS: 50.0000 mg | ORAL_TABLET | Freq: Four times a day (QID) | ORAL | 0 refills | Status: DC | PRN

## 2018-03-31 NOTE — Progress Notes (Signed)
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 New Patient (Initial Visit)  : I have low platelets and I need right hip surgery.  HPI: Lonnie Hansen is a very nice 68 year old African-American male.  Apparently, I saw him 20 years ago.  I cannot go back that far to see why I saw him.  However, he must have done well if I am not seen him in 20 years.  He sees Dr. Valetta Fuller.  As always, Dr. Sharlett Iles is very thorough.  Dr. Boykin Reaper did routine blood work on Lonnie Hansen.  This was done I think a few weeks ago.  Apparently, the platelet count was 65,000.  Lonnie Hansen is in dire need of a right hip repair.  He is a lot of pain in that right hip.  It sounds like it is bone-on-bone.  He was sent to the [facility] so that we could try to make sure that there is no issues with his platelets or bleeding.  He apparently had cardiothoracic surgery a couple years ago.  He said that he had a lot of bleeding with this.  He recalls that he had 5 units of blood and some platelets to try to get the bleeding stopped.  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  The problem right now is that he is taking aspirin via Goody's powder.  He does is 4-5 times a day.  He clearly needs to stop taking this.  I think this is probably the reason that he has had issues with bruising.  As far as he knows, there is no one else in the family that has any bleeding issues.  He does not have sickle cell disease.  He has had no fever.  He has had no cough.  There is been no change in bowel or bladder habits.  He is not sure when his hip surgery will be done.  He sees Dr. Ninfa Linden of orthopedic surgery.  He is not a vegetarian.  He is trying to watch what he eats.  He has had no mouth sores.  He has had no visual issues.  Overall, I would say his performance status is ECOG 1.   Past Medical  History:  Diagnosis Date  . Anemia   . Arthritis   . Asymptomatic bilateral carotid artery stenosis 08/2015   1-39%   . Blood dyscrasia    "trouble with my bloods clotting"  . Bruising    on the skin states due to platelets or sometimes low or high  . Cataracts, bilateral   . 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 Coronary artery disease   . Diverticulosis   . Enlarged aorta ([place])   . Enlarged prostate    slightly  . GERD (gastroesophageal reflux disease)    takes Pantoprazole daily as needed  . Glaucoma    uses eye drops daily  . Headache   . History of colon polyps    benign  . History of kidney stones   . Hyperlipidemia    no on any meds  . Hypertension    takes Amlodipine and Atenolol daily  . Joint pain   . Joint swelling   . Nocturia   . OSA on CPAP   . Sleep apnea   . Vocal cord nodule    pt. states  it's a" growth on vocal cord"  :  Past Surgical History:  Procedure Laterality Date  . AORTIC ARCH ANGIOGRAPHY N/A 04/16/2016   Procedure: Aortic Arch Angiography;  Surgeon: Floetta Brickey M Martinique, MD;  Location: [facility];  Service: Cardiovascular;  Laterality: N/A;  . AORTIC VALVE REPLACEMENT N/A 09/15/2016   Procedure: AORTIC VALVE REPLACEMENT (AVR);  Surgeon: Grace Isaac, MD;  Location: [facility];  Service: Open Heart Surgery;  Laterality: N/A;  Using 63mm Edwards Perimount Magna Ease Aortic Bioprosthesis Valve  . ASCENDING AORTIC ROOT REPLACEMENT N/A 09/15/2016   Procedure: ASCENDING AORTIC ROOT REPLACEMENT;  Surgeon: Grace Isaac, MD;  Location: [facility];  Service: Open Heart Surgery;  Laterality: N/A;  Using 31mm Gelweave Valsalva Graft  . COLONOSCOPY    . COLONOSCOPY WITH ESOPHAGOGASTRODUODENOSCOPY (EGD)    . IR THORACENTESIS ASP PLEURAL SPACE W/IMG GUIDE  10/23/2016  . MICROLARYNGOSCOPY Right 09/20/2017   Procedure: MICROLARYNGOSCOPY WITH  EXCISION OF VOCAL CORD LESION;  Surgeon: Leta Baptist, MD;  Location: [facility];   Service: ENT;  Laterality: Right;  . MULTIPLE EXTRACTIONS WITH ALVEOLOPLASTY N/A 06/10/2016   Procedure: Extraction of tooth #'s 2,8,13,15, and 29  with alveoloplasty, maxillary right and left buccal exostoses reductions, and gross debridement of remaining teeth.;  Surgeon: Lenn Cal, DDS;  Location: [facility];  Service: Oral Surgery;  Laterality: N/A;  . RIGHT/LEFT HEART CATH AND CORONARY ANGIOGRAPHY N/A 04/16/2016   Procedure: Right/Left Heart Cath and Coronary Angiography;  Surgeon: Federick Levene M Martinique, MD;  Location: [facility];  Service: Cardiovascular;  Laterality: N/A;  . TEE WITHOUT CARDIOVERSION N/A 09/15/2016   Procedure: TRANSESOPHAGEAL ECHOCARDIOGRAM (TEE);  Surgeon: Grace Isaac, MD;  Location: [place] [facility];  Service: Open Heart Surgery;  Laterality: N/A;  :   Current Outpatient Medications:  .  amLODipine (NORVASC) 10 MG tablet, Take 10 mg by mouth daily., Disp: , Rfl:  .  aspirin EC 81 MG EC tablet, Take 1 tablet (81 mg total) by mouth daily. (Patient not taking: Reported on 11/12/2017), Disp: , Rfl:  .  atenolol (TENORMIN) 25 MG tablet, Take 0.5 tablets (12.5 mg total) by mouth 2 (two) times daily., Disp: 30 tablet, Rfl: 1 .  Ferrous Sulfate (IRON) 325 (65 Fe) MG TABS, Take 1 tablet by mouth daily., Disp: , Rfl:  .  latanoprost (XALATAN) 0.005 % ophthalmic solution, Place 1 drop into both eyes at bedtime., Disp: , Rfl:  .  Multiple Vitamin (MULTIVITAMIN WITH MINERALS) TABS tablet, Take 1 tablet by mouth daily., Disp: , Rfl:  .  rosuvastatin (CRESTOR) 10 MG tablet, Take 1 tablet (10 mg total) by mouth daily., Disp: 90 tablet, Rfl: 3 .  tamsulosin (FLOMAX) 0.4 MG CAPS capsule, Take 0.4 mg by mouth daily., Disp: , Rfl:  .  traMADol (ULTRAM) 50 MG tablet, Take 1 tablet (50 mg total) by mouth every 6 (six) hours as needed., Disp: 60 tablet, Rfl: 0:  :  No Known Allergies:  Family History  Problem Relation Age of Onset  . Heart disease Father   . Heart attack Father   .  Thyroid disease Sister   :  Social History   Socioeconomic History  . Marital status: Married    Spouse name: Not on file  . Number of children: 1  . Years of education: Not on file  . Highest education level: Not on file  Occupational History  . Occupation: Musician  Social Needs  . Financial resource strain: Not on file  . Food insecurity:    Worry:  Not on file    Inability: Not on file  . Transportation needs:    Medical: Not on file    Non-medical: Not on file  Tobacco Use  . Smoking status: Former Smoker    Packs/day: 1.00    Years: 25.00    Pack years: 25.00    Types: Cigarettes  . Smokeless tobacco: Never Used  Substance and Sexual Activity  . Alcohol use: No    Alcohol/week: 0.0 standard drinks  . Drug use: No  . Sexual activity: Not on file  Lifestyle  . Physical activity:    Days per week: Not on file    Minutes per session: Not on file  . Stress: Not on file  Relationships  . Social connections:    Talks on phone: Not on file    Gets together: Not on file    Attends religious service: Not on file    Active member of club or organization: Not on file    Attends meetings of clubs or organizations: Not on file    Relationship status: Not on file  . Intimate partner violence:    Fear of current or ex partner: Not on file    Emotionally abused: Not on file    Physically abused: Not on file    Forced sexual activity: Not on file  Other Topics Concern  . 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  :  Review of Systems  Constitutional: Negative.   HENT: Negative.   Eyes: Negative.   Respiratory: Negative.   Cardiovascular: Negative.   Gastrointestinal: Negative.   Genitourinary: Negative.   Musculoskeletal: Positive for joint pain.  Skin: Negative.   Neurological: Negative.   Endo/Heme/Allergies: Bruises/bleeds easily.  Psychiatric/Behavioral: Negative.      Exam: Well-developed and  well-nourished African-American male in no obvious distress.  His vital signs show temperature of 98.8.  Pulse 55.  Blood pressure 150/83.  Weight is 211 pounds.  Head neck exam shows no ocular or oral lesions.  There are no palpable cervical or supraclavicular lymph nodes.  Lungs are clear bilaterally.  Cardiac exam regular rate and rhythm with a normal S1 and S2.  There are no murmurs, rubs or bruits.  Abdomen is soft.  He has good bowel sounds.  He is mildly obese.  He has no fluid wave.  There is no palpable liver or spleen tip.  Back exam shows no tenderness over the spine or ribs.  He has tenderness and decreased range of motion of the right hip.  Extremities show some chronic mild nonpitting edema in the lower legs.  Skin exam shows no ecchymoses or petechia.  Neurological exam shows no focal neurological deficits. @IPVITALS @   Recent Labs    03/31/18 1415  WBC 7.5  HGB 12.4*  HCT 37.2*  PLT 103*   No results for input(s): NA, K, CL, CO2, GLUCOSE, BUN, CREATININE, CALCIUM in the last 72 hours.  Blood smear review: Normochromic and normocytic population of red blood cells.  He has some elliptocytes.  I see no polychromasia.  There is no immature myeloid or lymphoid cells.  He has no nucleated red blood cells.  I see no rouleaux formation.  There is no hypersegmented polys.  His platelets are slightly decreased in number.  Platelets are well granulated.  He has several large platelets.  Pathology: None    Assessment and Plan: Lonnie Hansen is  a very nice 68 year old African-American male.  He has chronic thrombocytopenia.  I think there is mild chronic immune thrombocytopenia.  I think that we are going to have to try to get his platelet count up to make sure that he has no problems with bleeding with surgery.  I we will try him on Nplate.  I think this will help get his platelet count up.  I told him to stop the aspirin powder.  He must not take aspirin powder.  This will clearly inhibit  his platelet function.  I will send off platelet function test on him.  I will not do this until next week.  By then, he will be off the Goody's powder for at least 10 days.  We will have to find out from Dr. Ninfa Linden when surgery will be scheduled.  I would not think that he will need any type of platelet transfusions.  Again, I have to believe that the issue is that he takes this Goody's powder.  I spent about an hour with Lonnie Hansen today.  All the time spent face-to-face.  I counseled him.  I went over all of his labs.  I reviewed my recommendations with him.  I went over the Nplate with him.  He is in agreement.  I will plan to see him back in another 2 weeks.  By then, we should know when his surgery is going to be.  He will be off the Goody's powder.  He will have gotten the Nplate.

## 2018-03-31 NOTE — Telephone Encounter (Signed)
Appointments scheduled avs/calendar printed per 3/5 los °

## 2018-04-06 ENCOUNTER — Inpatient Hospital Stay: Payer: Medicare Other

## 2018-04-06 ENCOUNTER — Other Ambulatory Visit: Payer: Self-pay

## 2018-04-06 ENCOUNTER — Ambulatory Visit: Payer: Self-pay

## 2018-04-06 VITALS — BP 139/87 | HR 52 | Temp 98.2°F | Resp 18

## 2018-04-06 DIAGNOSIS — I712 Thoracic aortic aneurysm, without rupture: Secondary | ICD-10-CM | POA: Diagnosis not present

## 2018-04-06 DIAGNOSIS — D691 Qualitative platelet defects: Secondary | ICD-10-CM | POA: Diagnosis not present

## 2018-04-06 DIAGNOSIS — M25551 Pain in right hip: Secondary | ICD-10-CM | POA: Diagnosis not present

## 2018-04-06 DIAGNOSIS — M1990 Unspecified osteoarthritis, unspecified site: Secondary | ICD-10-CM | POA: Diagnosis not present

## 2018-04-06 DIAGNOSIS — D693 Immune thrombocytopenic purpura: Secondary | ICD-10-CM

## 2018-04-06 DIAGNOSIS — I10 Essential (primary) hypertension: Secondary | ICD-10-CM | POA: Diagnosis not present

## 2018-04-06 LAB — PLATELET BY CITRATE

## 2018-04-06 LAB — CBC WITH DIFFERENTIAL (CANCER CENTER ONLY)
Abs Immature Granulocytes: 0.02 10*3/uL (ref 0.00–0.07)
BASOS ABS: 0 10*3/uL (ref 0.0–0.1)
BASOS PCT: 1 %
EOS ABS: 0.2 10*3/uL (ref 0.0–0.5)
EOS PCT: 4 %
HCT: 36.9 % — ABNORMAL LOW (ref 39.0–52.0)
Hemoglobin: 12.5 g/dL — ABNORMAL LOW (ref 13.0–17.0)
IMMATURE GRANULOCYTES: 0 %
LYMPHS ABS: 1.6 10*3/uL (ref 0.7–4.0)
Lymphocytes Relative: 27 %
MCH: 31.6 pg (ref 26.0–34.0)
MCHC: 33.9 g/dL (ref 30.0–36.0)
MCV: 93.2 fL (ref 80.0–100.0)
Monocytes Absolute: 0.6 10*3/uL (ref 0.1–1.0)
Monocytes Relative: 11 %
NEUTROS PCT: 57 %
NRBC: 0 % (ref 0.0–0.2)
Neutro Abs: 3.4 10*3/uL (ref 1.7–7.7)
Platelet Count: 109 10*3/uL — ABNORMAL LOW (ref 150–400)
RBC: 3.96 MIL/uL — ABNORMAL LOW (ref 4.22–5.81)
RDW: 12.8 % (ref 11.5–15.5)
WBC: 5.8 10*3/uL (ref 4.0–10.5)

## 2018-04-06 LAB — PLATELET FUNCTION ASSAY
Collagen / ADP: 164 seconds — ABNORMAL HIGH (ref 0–118)
Collagen / Epinephrine: >296 seconds — ABNORMAL HIGH (ref 0–193)

## 2018-04-06 MED ORDER — ROMIPLOSTIM 250 MCG ~~LOC~~ SOLR
1.0000 ug/kg | Freq: Once | SUBCUTANEOUS | Status: AC
  Administered 2018-04-06: 95 ug via SUBCUTANEOUS
  Filled 2018-04-06: qty 0.19

## 2018-04-06 NOTE — Patient Instructions (Signed)
Romiplostim injection What is this medicine? ROMIPLOSTIM (roe mi PLOE stim) helps your body make more platelets. This medicine is used to treat low platelets caused by chronic idiopathic thrombocytopenic purpura (ITP). This medicine may be used for other purposes; ask your health care provider or pharmacist if you have questions. COMMON BRAND NAME(S): Nplate What should I tell my health care provider before I take this medicine? They need to know if you have any of these conditions: -bleeding disorders -bone marrow problem, like blood cancer or myelodysplastic syndrome -history of blood clots -liver disease -surgery to remove your spleen -an unusual or allergic reaction to romiplostim, mannitol, other medicines, foods, dyes, or preservatives -pregnant or trying to get pregnant -breast-feeding How should I use this medicine? This medicine is for injection under the skin. It is given by a health care professional in a hospital or clinic setting. A special MedGuide will be given to you before your injection. Read this information carefully each time. Talk to your pediatrician regarding the use of this medicine in children. While this drug may be prescribed for children as young as 1 year for selected conditions, precautions do apply. Overdosage: If you think you have taken too much of this medicine contact a poison control center or emergency room at once. NOTE: This medicine is only for you. Do not share this medicine with others. What if I miss a dose? It is important not to miss your dose. Call your doctor or health care professional if you are unable to keep an appointment. What may interact with this medicine? Interactions are not expected. This list may not describe all possible interactions. Give your health care provider a list of all the medicines, herbs, non-prescription drugs, or dietary supplements you use. Also tell them if you smoke, drink alcohol, or use illegal drugs. Some items  may interact with your medicine. What should I watch for while using this medicine? Your condition will be monitored carefully while you are receiving this medicine. Visit your prescriber or health care professional for regular checks on your progress and for the needed blood tests. It is important to keep all appointments. What side effects may I notice from receiving this medicine? Side effects that you should report to your doctor or health care professional as soon as possible: -allergic reactions like skin rash, itching or hives, swelling of the face, lips, or tongue -signs and symptoms of bleeding such as bloody or black, tarry stools; red or dark brown urine; spitting up blood or brown material that looks like coffee grounds; red spots on the skin; unusual bruising or bleeding from the eyes, gums, or nose -signs and symptoms of a blood clot such as chest pain; shortness of breath; pain, swelling, or warmth in the leg -signs and symptoms of a stroke like changes in vision; confusion; trouble speaking or understanding; severe headaches; sudden numbness or weakness of the face, arm or leg; trouble walking; dizziness; loss of balance or coordination Side effects that usually do not require medical attention (report to your doctor or health care professional if they continue or are bothersome): -headache -pain in arms and legs -pain in mouth -stomach pain This list may not describe all possible side effects. Call your doctor for medical advice about side effects. You may report side effects to FDA at 1-800-FDA-1088. Where should I keep my medicine? This drug is given in a hospital or clinic and will not be stored at home. NOTE: This sheet is a summary. It may not   cover all possible information. If you have questions about this medicine, talk to your doctor, pharmacist, or health care provider.  2019 Elsevier/Gold Standard (2017-01-11 11:10:55)  

## 2018-04-13 ENCOUNTER — Inpatient Hospital Stay: Payer: Medicare Other

## 2018-04-13 ENCOUNTER — Other Ambulatory Visit: Payer: Self-pay

## 2018-04-13 VITALS — BP 150/82 | HR 61 | Temp 98.9°F | Resp 18

## 2018-04-13 DIAGNOSIS — M1990 Unspecified osteoarthritis, unspecified site: Secondary | ICD-10-CM | POA: Diagnosis not present

## 2018-04-13 DIAGNOSIS — M25551 Pain in right hip: Secondary | ICD-10-CM | POA: Diagnosis not present

## 2018-04-13 DIAGNOSIS — D693 Immune thrombocytopenic purpura: Secondary | ICD-10-CM | POA: Diagnosis not present

## 2018-04-13 DIAGNOSIS — I10 Essential (primary) hypertension: Secondary | ICD-10-CM | POA: Diagnosis not present

## 2018-04-13 DIAGNOSIS — D691 Qualitative platelet defects: Secondary | ICD-10-CM | POA: Diagnosis not present

## 2018-04-13 DIAGNOSIS — I712 Thoracic aortic aneurysm, without rupture: Secondary | ICD-10-CM | POA: Diagnosis not present

## 2018-04-13 LAB — CBC WITH DIFFERENTIAL (CANCER CENTER ONLY)
ABS IMMATURE GRANULOCYTES: 0.03 10*3/uL (ref 0.00–0.07)
BASOS PCT: 0 %
Basophils Absolute: 0 10*3/uL (ref 0.0–0.1)
Eosinophils Absolute: 0.2 10*3/uL (ref 0.0–0.5)
Eosinophils Relative: 3 %
HCT: 38.4 % — ABNORMAL LOW (ref 39.0–52.0)
Hemoglobin: 12.8 g/dL — ABNORMAL LOW (ref 13.0–17.0)
IMMATURE GRANULOCYTES: 0 %
LYMPHS ABS: 1.6 10*3/uL (ref 0.7–4.0)
Lymphocytes Relative: 23 %
MCH: 31 pg (ref 26.0–34.0)
MCHC: 33.3 g/dL (ref 30.0–36.0)
MCV: 93 fL (ref 80.0–100.0)
MONO ABS: 0.7 10*3/uL (ref 0.1–1.0)
MONOS PCT: 11 %
NEUTROS ABS: 4.3 10*3/uL (ref 1.7–7.7)
NEUTROS PCT: 63 %
PLATELETS: 116 10*3/uL — AB (ref 150–400)
RBC: 4.13 MIL/uL — ABNORMAL LOW (ref 4.22–5.81)
RDW: 12.8 % (ref 11.5–15.5)
WBC Count: 6.9 10*3/uL (ref 4.0–10.5)
nRBC: 0 % (ref 0.0–0.2)

## 2018-04-13 LAB — PLATELET BY CITRATE

## 2018-04-13 MED ORDER — ROMIPLOSTIM 250 MCG ~~LOC~~ SOLR
2.0000 ug/kg | Freq: Once | SUBCUTANEOUS | Status: AC
  Administered 2018-04-13: 190 ug via SUBCUTANEOUS
  Filled 2018-04-13: qty 0.38

## 2018-04-13 NOTE — Patient Instructions (Signed)
Romiplostim injection (Nplate) What is this medicine? ROMIPLOSTIM (roe mi PLOE stim) helps your body make more platelets. This medicine is used to treat low platelets caused by chronic idiopathic thrombocytopenic purpura (ITP). This medicine may be used for other purposes; ask your health care provider or pharmacist if you have questions. COMMON BRAND NAME(S): Nplate What should I tell my health care provider before I take this medicine? They need to know if you have any of these conditions: -bleeding disorders -bone marrow problem, like blood cancer or myelodysplastic syndrome -history of blood clots -liver disease -surgery to remove your spleen -an unusual or allergic reaction to romiplostim, mannitol, other medicines, foods, dyes, or preservatives -pregnant or trying to get pregnant -breast-feeding How should I use this medicine? This medicine is for injection under the skin. It is given by a health care professional in a hospital or clinic setting. A special MedGuide will be given to you before your injection. Read this information carefully each time. Talk to your pediatrician regarding the use of this medicine in children. While this drug may be prescribed for children as young as 1 year for selected conditions, precautions do apply. Overdosage: If you think you have taken too much of this medicine contact a poison control center or emergency room at once. NOTE: This medicine is only for you. Do not share this medicine with others. What if I miss a dose? It is important not to miss your dose. Call your doctor or health care professional if you are unable to keep an appointment. What may interact with this medicine? Interactions are not expected. This list may not describe all possible interactions. Give your health care provider a list of all the medicines, herbs, non-prescription drugs, or dietary supplements you use. Also tell them if you smoke, drink alcohol, or use illegal drugs.  Some items may interact with your medicine. What should I watch for while using this medicine? Your condition will be monitored carefully while you are receiving this medicine. Visit your prescriber or health care professional for regular checks on your progress and for the needed blood tests. It is important to keep all appointments. What side effects may I notice from receiving this medicine? Side effects that you should report to your doctor or health care professional as soon as possible: -allergic reactions like skin rash, itching or hives, swelling of the face, lips, or tongue -signs and symptoms of bleeding such as bloody or black, tarry stools; red or dark brown urine; spitting up blood or brown material that looks like coffee grounds; red spots on the skin; unusual bruising or bleeding from the eyes, gums, or nose -signs and symptoms of a blood clot such as chest pain; shortness of breath; pain, swelling, or warmth in the leg -signs and symptoms of a stroke like changes in vision; confusion; trouble speaking or understanding; severe headaches; sudden numbness or weakness of the face, arm or leg; trouble walking; dizziness; loss of balance or coordination Side effects that usually do not require medical attention (report to your doctor or health care professional if they continue or are bothersome): -headache -pain in arms and legs -pain in mouth -stomach pain This list may not describe all possible side effects. Call your doctor for medical advice about side effects. You may report side effects to FDA at 1-800-FDA-1088. Where should I keep my medicine? This drug is given in a hospital or clinic and will not be stored at home. NOTE: This sheet is a summary. It may  not cover all possible information. If you have questions about this medicine, talk to your doctor, pharmacist, or health care provider.  2019 Elsevier/Gold Standard (2017-01-11 11:10:55)

## 2018-04-18 ENCOUNTER — Other Ambulatory Visit: Payer: Self-pay | Admitting: *Deleted

## 2018-04-18 DIAGNOSIS — N186 End stage renal disease: Secondary | ICD-10-CM

## 2018-04-18 DIAGNOSIS — D693 Immune thrombocytopenic purpura: Secondary | ICD-10-CM

## 2018-04-18 DIAGNOSIS — D631 Anemia in chronic kidney disease: Secondary | ICD-10-CM

## 2018-04-18 DIAGNOSIS — Z992 Dependence on renal dialysis: Secondary | ICD-10-CM

## 2018-04-19 ENCOUNTER — Inpatient Hospital Stay: Payer: Medicare Other

## 2018-04-19 ENCOUNTER — Other Ambulatory Visit: Payer: Self-pay

## 2018-04-19 ENCOUNTER — Inpatient Hospital Stay (HOSPITAL_BASED_OUTPATIENT_CLINIC_OR_DEPARTMENT_OTHER): Payer: Medicare Other | Admitting: Hematology & Oncology

## 2018-04-19 VITALS — BP 146/79 | HR 51 | Temp 98.8°F | Resp 18 | Wt 209.5 lb

## 2018-04-19 DIAGNOSIS — D691 Qualitative platelet defects: Secondary | ICD-10-CM

## 2018-04-19 DIAGNOSIS — T148XXA Other injury of unspecified body region, initial encounter: Secondary | ICD-10-CM

## 2018-04-19 DIAGNOSIS — D693 Immune thrombocytopenic purpura: Secondary | ICD-10-CM | POA: Diagnosis not present

## 2018-04-19 DIAGNOSIS — Z7982 Long term (current) use of aspirin: Secondary | ICD-10-CM | POA: Diagnosis not present

## 2018-04-19 DIAGNOSIS — I10 Essential (primary) hypertension: Secondary | ICD-10-CM | POA: Diagnosis not present

## 2018-04-19 DIAGNOSIS — M1990 Unspecified osteoarthritis, unspecified site: Secondary | ICD-10-CM

## 2018-04-19 DIAGNOSIS — I712 Thoracic aortic aneurysm, without rupture: Secondary | ICD-10-CM | POA: Diagnosis not present

## 2018-04-19 DIAGNOSIS — Z992 Dependence on renal dialysis: Secondary | ICD-10-CM

## 2018-04-19 DIAGNOSIS — D631 Anemia in chronic kidney disease: Secondary | ICD-10-CM

## 2018-04-19 DIAGNOSIS — Z79899 Other long term (current) drug therapy: Secondary | ICD-10-CM

## 2018-04-19 DIAGNOSIS — Z87891 Personal history of nicotine dependence: Secondary | ICD-10-CM | POA: Diagnosis not present

## 2018-04-19 DIAGNOSIS — M25551 Pain in right hip: Secondary | ICD-10-CM | POA: Diagnosis not present

## 2018-04-19 DIAGNOSIS — N186 End stage renal disease: Secondary | ICD-10-CM

## 2018-04-19 LAB — CMP (CANCER CENTER ONLY)
ALT: 10 U/L (ref 0–44)
AST: 17 U/L (ref 15–41)
Albumin: 4.8 g/dL (ref 3.5–5.0)
Alkaline Phosphatase: 38 U/L (ref 38–126)
Anion gap: 8 (ref 5–15)
BUN: 15 mg/dL (ref 8–23)
CO2: 30 mmol/L (ref 22–32)
Calcium: 9.4 mg/dL (ref 8.9–10.3)
Chloride: 103 mmol/L (ref 98–111)
Creatinine: 0.99 mg/dL (ref 0.61–1.24)
GFR, Estimated: >60 mL/min (ref >60)
Glucose, Bld: 109 mg/dL — ABNORMAL HIGH (ref 70–99)
Potassium: 4.2 mmol/L (ref 3.5–5.1)
Sodium: 141 mmol/L (ref 135–145)
Total Bilirubin: 0.5 mg/dL (ref 0.3–1.2)
Total Protein: 7.2 g/dL (ref 6.5–8.1)

## 2018-04-19 LAB — CBC WITH DIFFERENTIAL (CANCER CENTER ONLY)
Abs Immature Granulocytes: 0.05 10*3/uL (ref 0.00–0.07)
Basophils Absolute: 0 10*3/uL (ref 0.0–0.1)
Basophils Relative: 0 %
Eosinophils Absolute: 0.2 10*3/uL (ref 0.0–0.5)
Eosinophils Relative: 3 %
HCT: 37.5 % — ABNORMAL LOW (ref 39.0–52.0)
Hemoglobin: 12.5 g/dL — ABNORMAL LOW (ref 13.0–17.0)
Immature Granulocytes: 1 %
Lymphocytes Relative: 24 %
Lymphs Abs: 1.6 10*3/uL (ref 0.7–4.0)
MCH: 31.3 pg (ref 26.0–34.0)
MCHC: 33.3 g/dL (ref 30.0–36.0)
MCV: 93.8 fL (ref 80.0–100.0)
MONO ABS: 0.7 10*3/uL (ref 0.1–1.0)
Monocytes Relative: 11 %
NEUTROS ABS: 4 10*3/uL (ref 1.7–7.7)
Neutrophils Relative %: 61 %
Platelet Count: 150 10*3/uL (ref 150–400)
RBC: 4 MIL/uL — AB (ref 4.22–5.81)
RDW: 12.8 % (ref 11.5–15.5)
WBC Count: 6.6 10*3/uL (ref 4.0–10.5)
nRBC: 0 % (ref 0.0–0.2)

## 2018-04-19 NOTE — Progress Notes (Signed)
Hematology and Oncology Follow Up Visit  Lonnie Hansen [id number] 1950/04/25 68 y.o. 04/19/2018   Principle Diagnosis:   Platelet dysfunction  Chronic ITP  Current Therapy:    Nplate as needed     Interim History:  Lonnie Hansen is back for follow-up.  This is a second office visit.  We saw him back in early March.  We finally were able to send off a platelet function assay on him.  This was markedly abnormal.  I think it is clear that he has some type of inherent platelet dysfunction.  He says that this has been written his family.  I am going to send his blood off today to see if he actually has von Willebrand disease.  I know that this is incredibly rare in the African-American population.  However, he says that there is a family member that is [place].  The Nplate has worked very nicely on the chronic ITP.  His platelet count has come up slowly but surely.  He is not sure at all when his surgery will be for the right hip.  He says that he actually has not been sent to Dr. Ninfa Linden yet.  Dr. Sharlett Iles, who is Lonnie Hansen's family doctor, we will have to make this referral.  There is been no bleeding.  He has had no bruising.  He has had no nausea or vomiting.  Overall, his performance status is ECOG 2.  Medications:  Current Outpatient Medications:  .  amLODipine (NORVASC) 10 MG tablet, Take 10 mg by mouth daily., Disp: , Rfl:  .  aspirin EC 81 MG EC tablet, Take 1 tablet (81 mg total) by mouth daily. (Patient not taking: Reported on 04/13/2018), Disp: , Rfl:  .  atenolol (TENORMIN) 25 MG tablet, Take 0.5 tablets (12.5 mg total) by mouth 2 (two) times daily., Disp: 30 tablet, Rfl: 1 .  Ferrous Sulfate (IRON) 325 (65 Fe) MG TABS, Take 1 tablet by mouth daily., Disp: , Rfl:  .  latanoprost (XALATAN) 0.005 % ophthalmic solution, Place 1 drop into both eyes at bedtime., Disp: , Rfl:  .  Multiple Vitamin (MULTIVITAMIN WITH MINERALS) TABS tablet, Take 1 tablet by mouth daily., Disp:  , Rfl:  .  rosuvastatin (CRESTOR) 10 MG tablet, Take 1 tablet (10 mg total) by mouth daily., Disp: 90 tablet, Rfl: 3 .  tamsulosin (FLOMAX) 0.4 MG CAPS capsule, Take 0.4 mg by mouth daily., Disp: , Rfl:  .  traMADol (ULTRAM) 50 MG tablet, Take 1 tablet (50 mg total) by mouth every 6 (six) hours as needed., Disp: 60 tablet, Rfl: 0  Allergies: No Known Allergies  Past Medical History, Surgical history, Social history, and Family History were reviewed and updated.  Review of Systems: Review of Systems  Constitutional: Negative.   HENT:  Negative.   Eyes: Negative.   Respiratory: Negative.   Cardiovascular: Negative.   Gastrointestinal: Negative.   Endocrine: Negative.   Genitourinary: Negative.    Musculoskeletal: Positive for back pain.  Skin: Negative.   Neurological: Negative.   Hematological: Negative.   Psychiatric/Behavioral: Negative.     Physical Exam:  weight is 209 lb 8 oz (95 kg). His oral temperature is 98.8 F (37.1 C). His blood pressure is 146/79 (abnormal) and his pulse is 51 (abnormal). His respiration is 18 and oxygen saturation is 100%.   Wt Readings from Last 3 Encounters:  04/19/18 209 lb 8 oz (95 kg)  03/31/18 211 lb 12 oz (96 kg)  11/12/17 198 lb (  89.8 kg)    Physical Exam Vitals signs reviewed.  HENT:     Head: Normocephalic and atraumatic.  Eyes:     Pupils: Pupils are equal, round, and reactive to light.  Neck:     Musculoskeletal: Normal range of motion.  Cardiovascular:     Rate and Rhythm: Normal rate and regular rhythm.     Heart sounds: Normal heart sounds.  Pulmonary:     Effort: Pulmonary effort is normal.     Breath sounds: Normal breath sounds.  Abdominal:     General: Bowel sounds are normal.     Palpations: Abdomen is soft.  Musculoskeletal: Normal range of motion.        General: No tenderness or deformity.  Lymphadenopathy:     Cervical: No cervical adenopathy.  Skin:    General: Skin is warm and dry.     Findings: No  erythema or rash.  Neurological:     Mental Status: He is alert and oriented to person, place, and time.  Psychiatric:        Behavior: Behavior normal.        Thought Content: Thought content normal.        Judgment: Judgment normal.      Lab Results  Component Value Date   WBC 6.6 04/19/2018   HGB 12.5 (L) 04/19/2018   HCT 37.5 (L) 04/19/2018   MCV 93.8 04/19/2018   PLT 150 04/19/2018     Chemistry      Component Value Date/Time   NA 141 04/19/2018 1055   NA 141 11/11/2017 1224   K 4.2 04/19/2018 1055   CL 103 04/19/2018 1055   CO2 30 04/19/2018 1055   BUN 15 04/19/2018 1055   BUN 14 11/11/2017 1224   CREATININE 0.99 04/19/2018 1055   CREATININE 0.75 04/10/2016 1132      Component Value Date/Time   CALCIUM 9.4 04/19/2018 1055   ALKPHOS 38 04/19/2018 1055   AST 17 04/19/2018 1055   ALT 10 04/19/2018 1055   BILITOT 0.5 04/19/2018 1055      Impression and Plan: Lonnie Hansen is a 68 year old African-American male.  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  We took care of the immune thrombocytopenia with the Nplate.  We can always use this again once we know when surgery is going to be scheduled.  My bigger concern clearly is this platelet dysfunction.  I have sent off a von Willebrand assay.  I would think that von Willebrand disease might be what we are looking at.  If this is the case, then we might have to think about giving some type of factor replacement for him if he is to have the surgery for the right hip.  I told him that he or his orthopedic surgeon will let me know when surgery is being scheduled.  Once I know this, while we will get him back in and then we can work on his platelets and make sure that there will not be any bleeding issues.  I spent about 35 minutes with Lonnie Hansen.  All the time spent face-to-face.  I counseled him.  I explained what my recommendations were for his platelet  problem.  He appreciates my diligent attitude and I want to make sure that if he has surgery in the future that it is safe for him.   Volanda Napoleon, MD 3/24/202012:04 PM

## 2018-04-20 ENCOUNTER — Telehealth: Payer: Self-pay | Admitting: *Deleted

## 2018-04-20 LAB — VON WILLEBRAND PANEL
Coagulation Factor VIII: 210 % — ABNORMAL HIGH (ref 56–140)
Ristocetin Co-factor, Plasma: 199 % (ref 50–200)
Von Willebrand Antigen, Plasma: 289 % — ABNORMAL HIGH (ref 50–200)

## 2018-04-20 LAB — COAG STUDIES INTERP REPORT

## 2018-04-20 NOTE — Telephone Encounter (Signed)
As noted below by [name], I informed the patient that he doesn't have von Willebrnd's disease. He verbalized understanding.

## 2018-04-20 NOTE — Telephone Encounter (Signed)
-----   Message from Volanda Napoleon, MD sent at 04/20/2018  6:43 AM EDT ----- Call - you do NOT have von Willebrand's disease!!!  pete

## 2018-05-05 ENCOUNTER — Encounter: Payer: Self-pay | Admitting: Cardiothoracic Surgery

## 2018-05-09 ENCOUNTER — Ambulatory Visit: Payer: Self-pay | Admitting: Cardiology

## 2018-06-28 IMAGING — DX DG CHEST 1V PORT
1 series · 1 of 1 positions shown · non-contrast
Comparison: September 11, 2016

CLINICAL DATA: Hypoxia.  Status post aortic valve replacement

EXAM:
PORTABLE CHEST 1 VIEW

[chest ap]
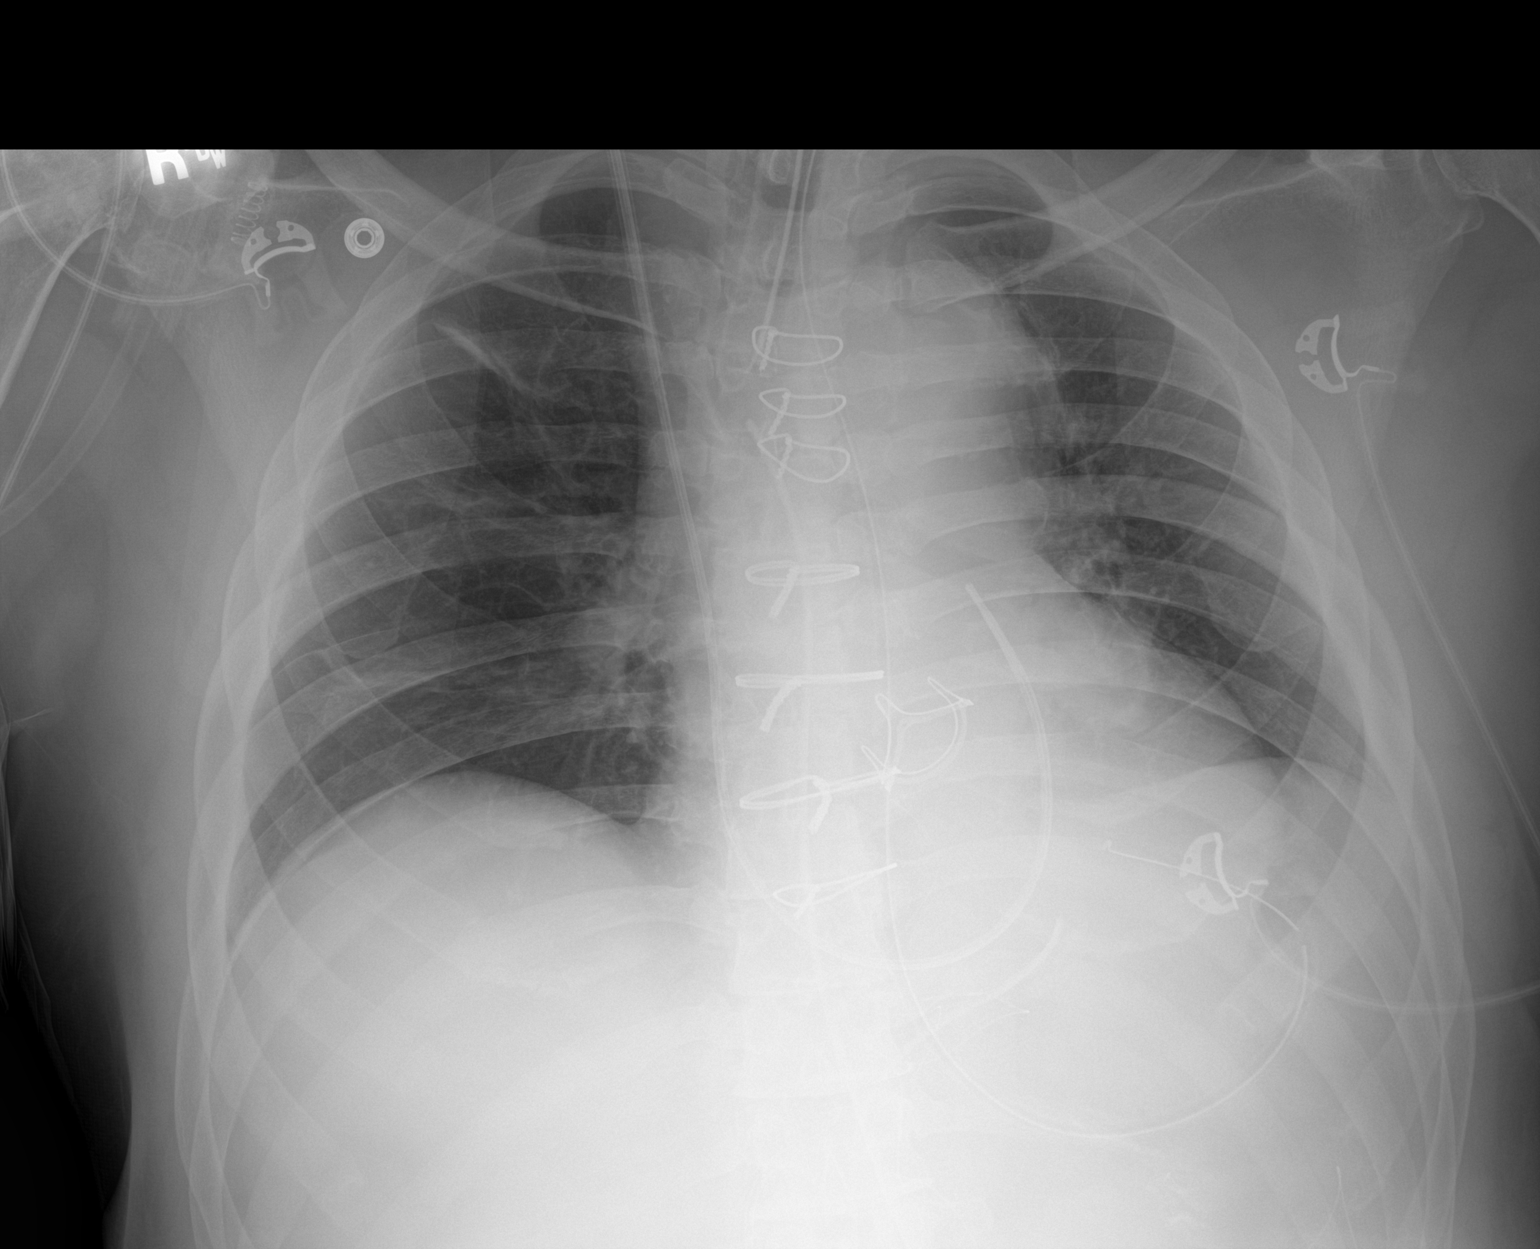

[1 of 1 positions shown; findings below may reference images not displayed]

FINDINGS: Endotracheal tube tip is 2.2 cm above the carina. Swan-Ganz catheter
tip is in the main pulmonary outflow tract. Nasogastric tube tip and
side port are in the stomach. There is a left chest tube and
mediastinal drain. No pneumothorax.

There are areas of subsegmental atelectasis at several sites in the
right lung. There is mild left base atelectasis as well. No airspace
consolidation. Heart is mildly enlarged with pulmonary vascularity
within normal limits. Aorta is tortuous but stable. There is aortic
atherosclerosis. No evident adenopathy. No pneumothorax. No bone
lesions. Patient is status post aortic valve replacement.
IMPRESSION: Tube and catheter positions as described without pneumothorax. Areas
of atelectatic change without airspace consolidation evident. Status
post aortic valve replacement. There is thoracic aortic prominence
and tortuosity with aortic atherosclerosis, stable.

Aortic Atherosclerosis ([id number]).

## 2018-06-29 IMAGING — DX DG CHEST 1V PORT
1 series · 2 of 2 positions shown · non-contrast
Comparison: Chest radiograph 09/15/2016

CLINICAL DATA: Aortic valve replacement

EXAM:
PORTABLE CHEST 1 VIEW

[Series 1: chest · 0.14mm/px · 2 of 2 slices shown]
[im 1/2]
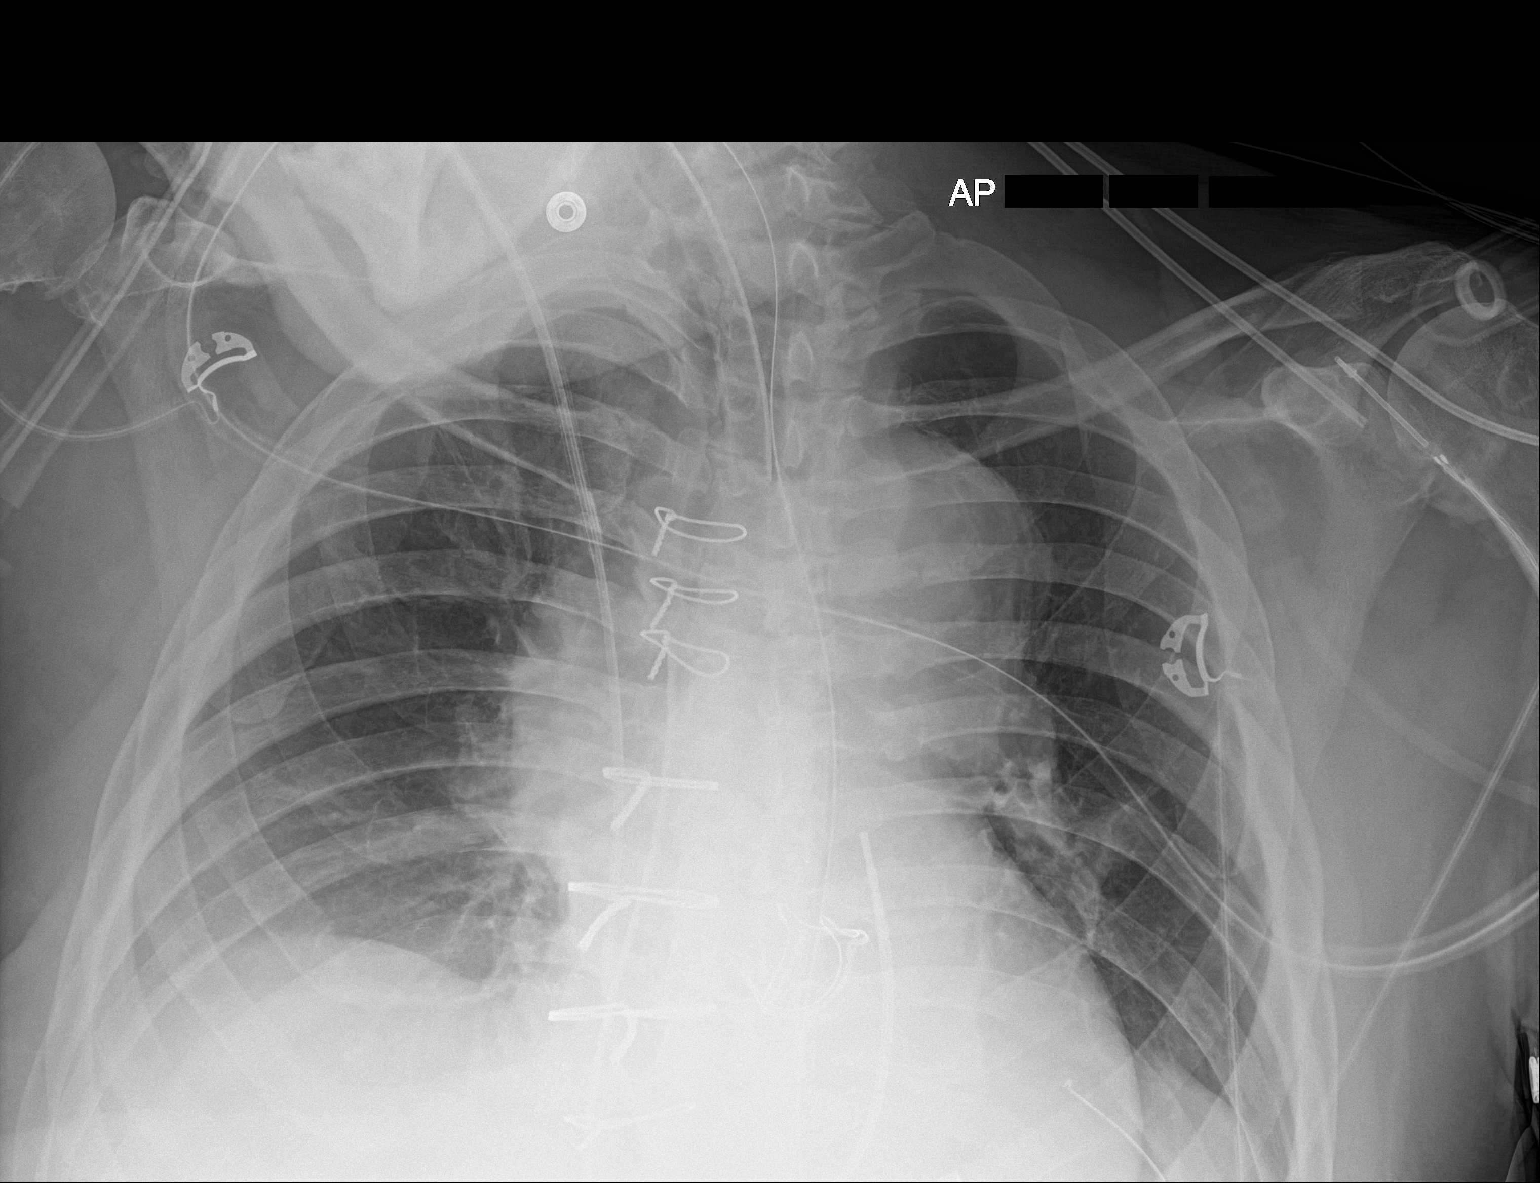
[im 2/2]
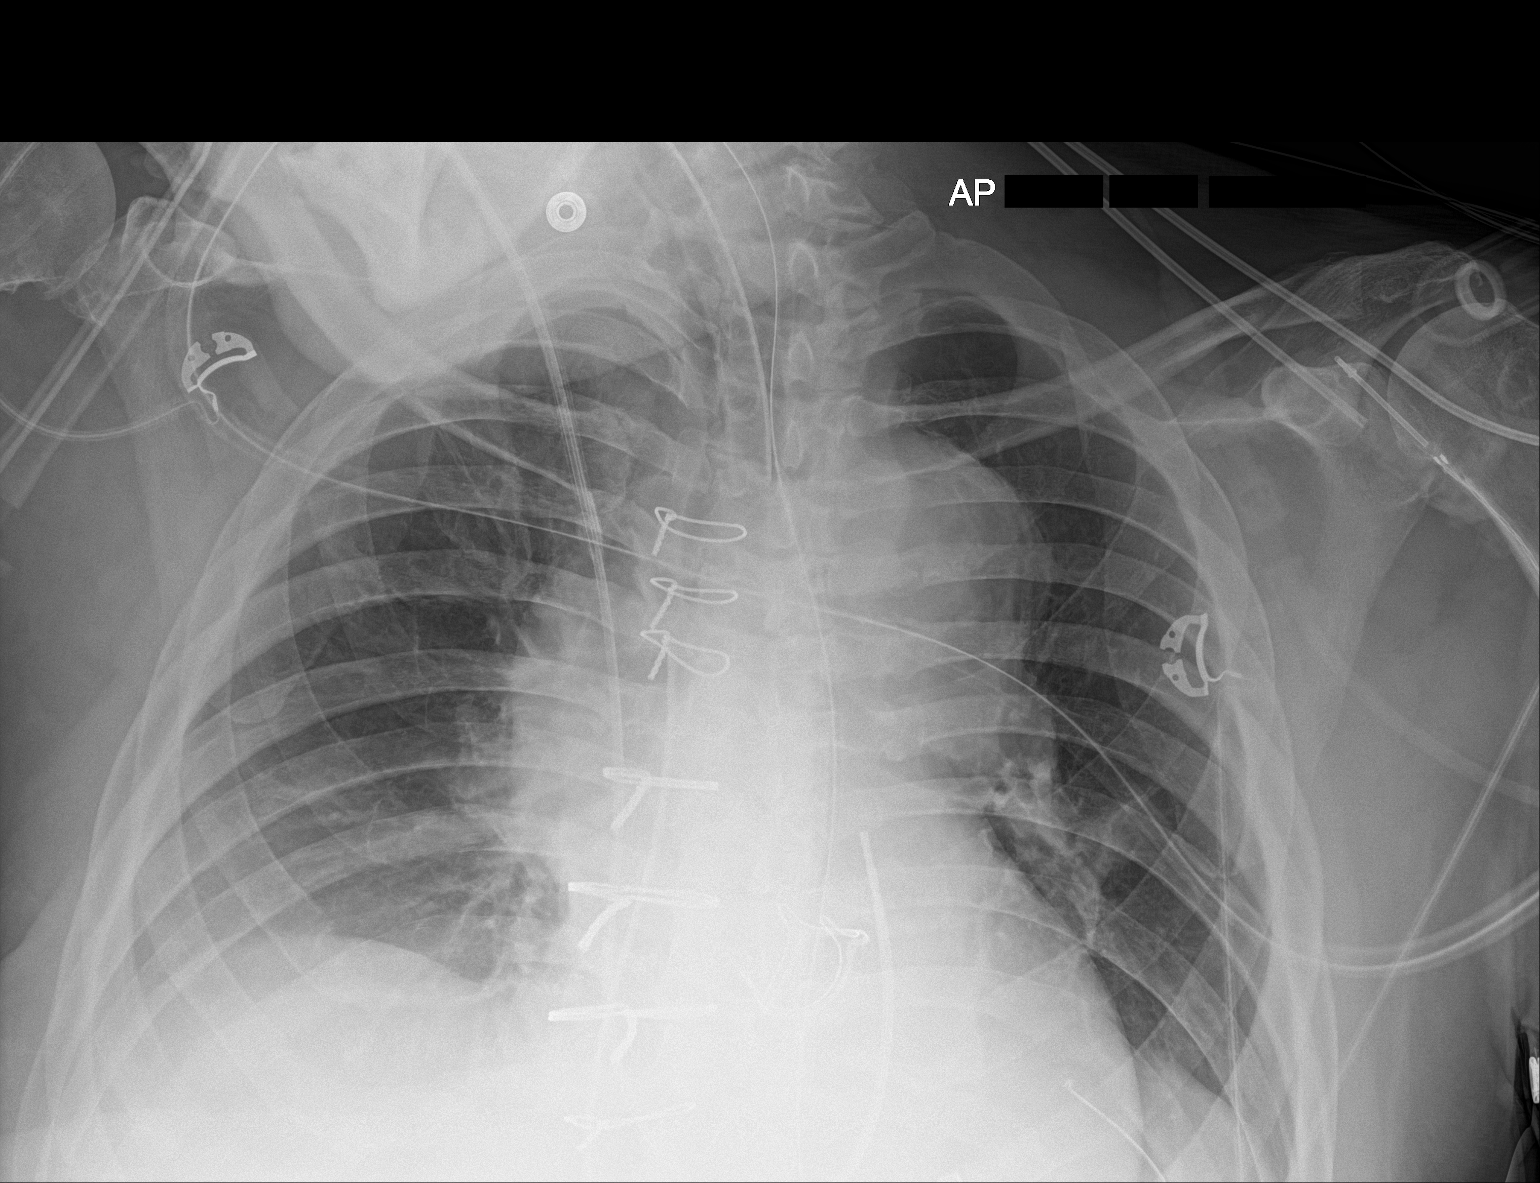

[2 of 2 positions shown; findings below may reference images not displayed]

FINDINGS: Endotracheal tube tip is in appropriate position at the level of the
clavicular heads. Orogastric tube courses beyond the field of view.
The course of the pulmonary arterial catheter is incompletely
visualized but the tip overlies the main pulmonary artery. Widened
appearance of the upper mediastinum is unchanged. Bibasilar
atelectasis without focal consolidation.
IMPRESSION: 1. Unchanged support apparatus.
2. Persistent upper mediastinal widening and bibasilar atelectasis.

## 2018-07-06 DIAGNOSIS — H401231 Low-tension glaucoma, bilateral, mild stage: Secondary | ICD-10-CM | POA: Diagnosis not present

## 2018-07-06 DIAGNOSIS — H2640 Unspecified secondary cataract: Secondary | ICD-10-CM | POA: Diagnosis not present

## 2018-07-19 DIAGNOSIS — R490 Dysphonia: Secondary | ICD-10-CM | POA: Diagnosis not present

## 2018-07-19 DIAGNOSIS — D141 Benign neoplasm of larynx: Secondary | ICD-10-CM | POA: Diagnosis not present

## 2018-08-12 IMAGING — CT CT IMAGE GUIDED DRAINAGE BY PERCUTANEOUS CATHETER
1 of 2 series · 15 of 32 positions shown, 19 images · non-contrast
Comparison: none

INDICATION: Right hydropneumothorax, shortness of breath, chest discomfort

[Series 2: i-spiral 5.0 b40f · axial · 0.78mm/px · z∈[+1265,+1556]mm · 15 of 94 slices shown, 19 images]
[im 7/94  soft-tissue]
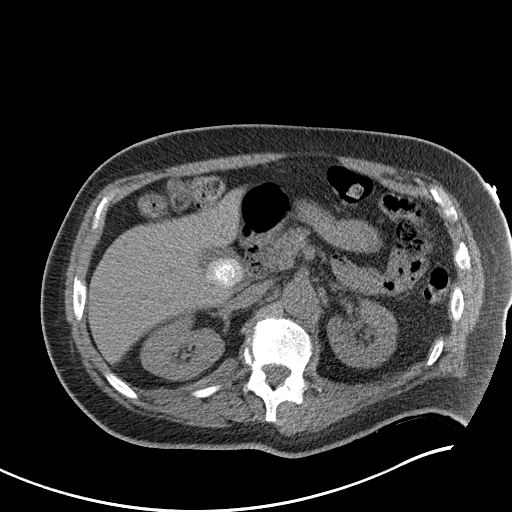
[im 7/94  bone]
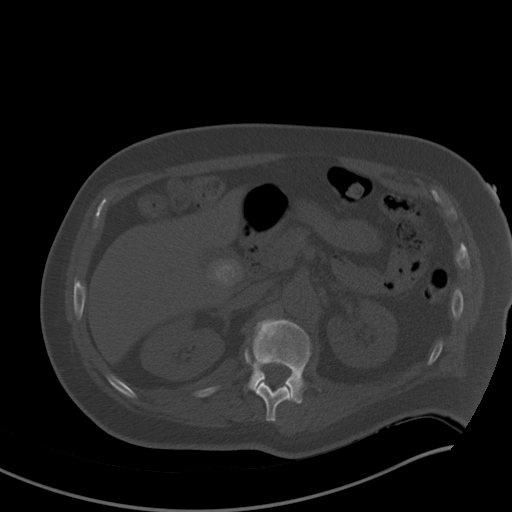
[im 14/94  soft-tissue]
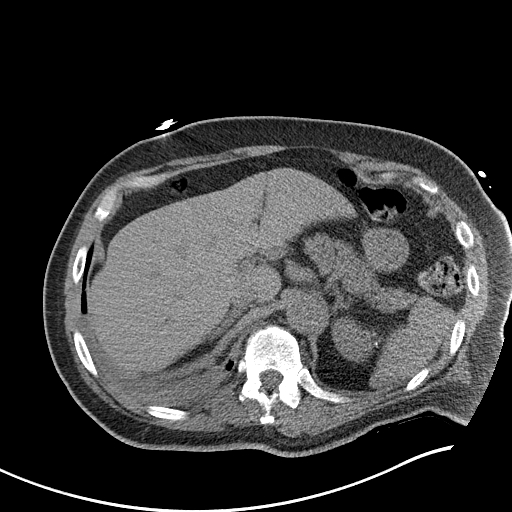
[im 21/94  soft-tissue]
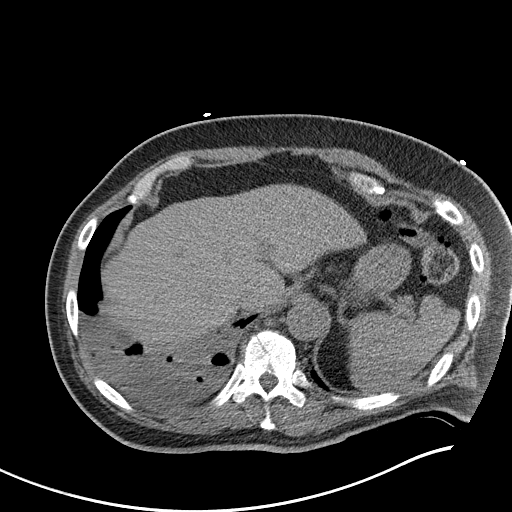
[im 28/94  soft-tissue]
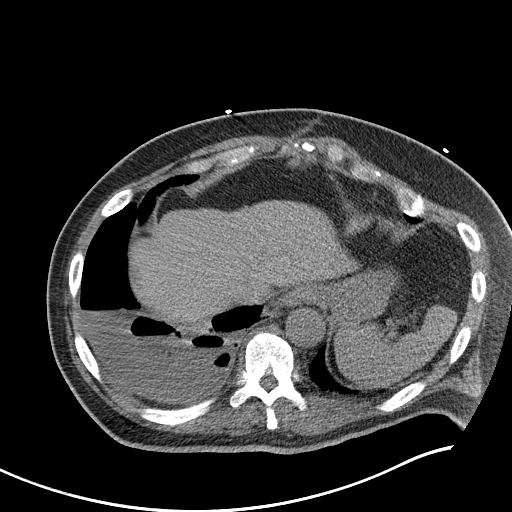
[im 35/94  soft-tissue]
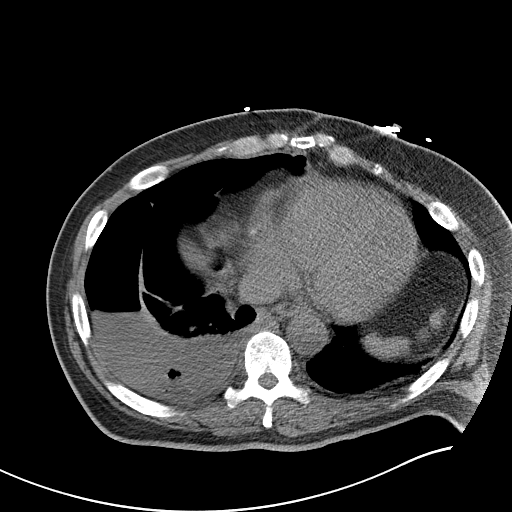
[im 42/94  soft-tissue]
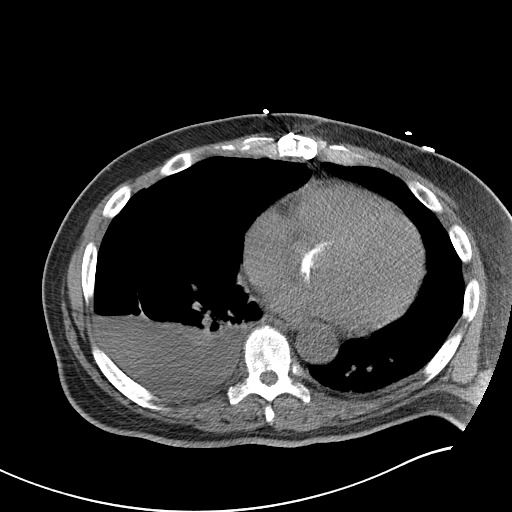
[im 49/94  soft-tissue]
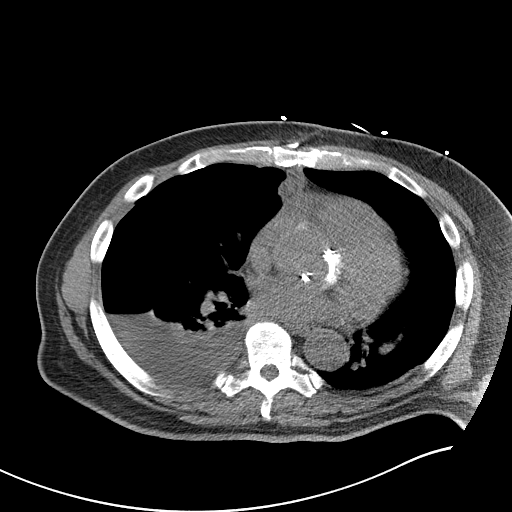
[im 56/94  soft-tissue]
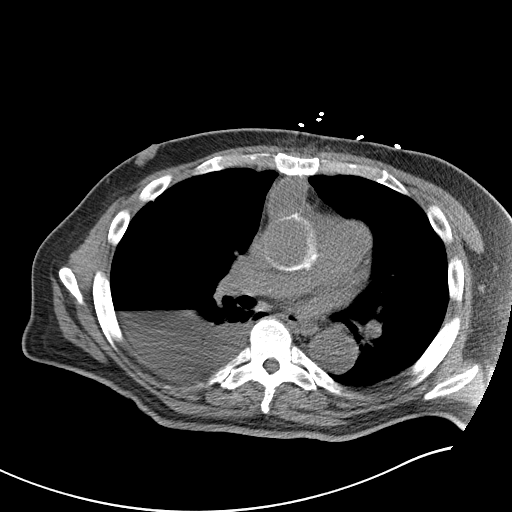
[im 63/94  soft-tissue]
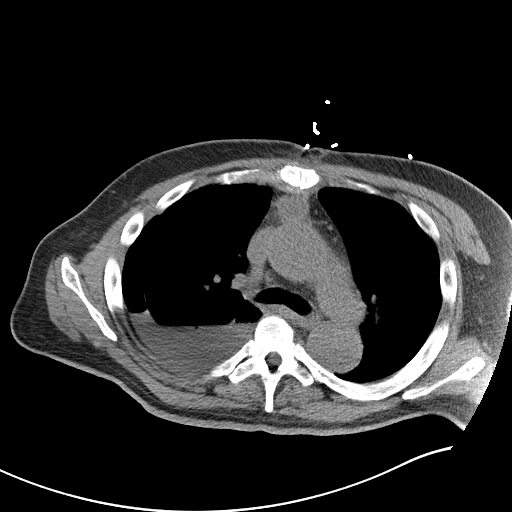
[im 63/94  bone]
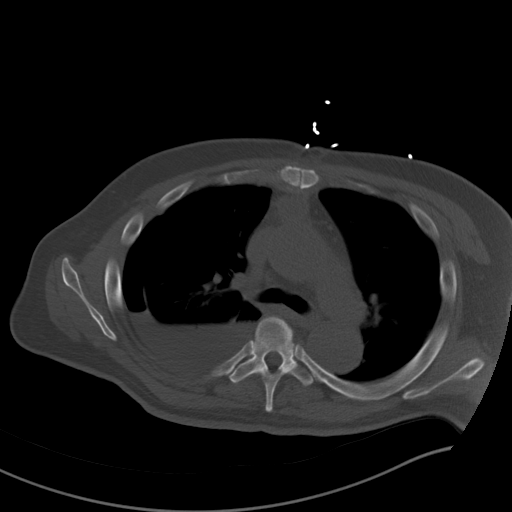
[im 69/94  soft-tissue]
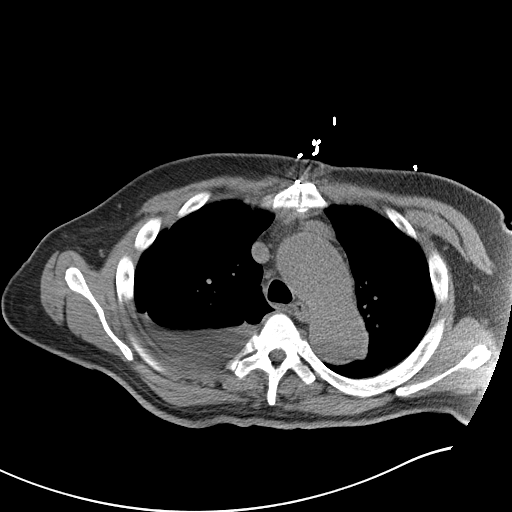
[im 76/94  soft-tissue]
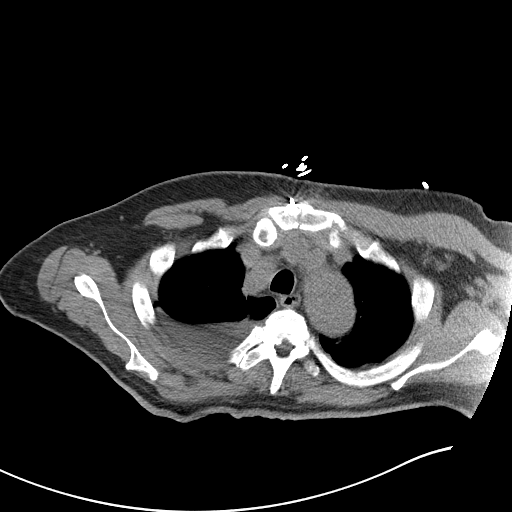
[im 80/94  lung]
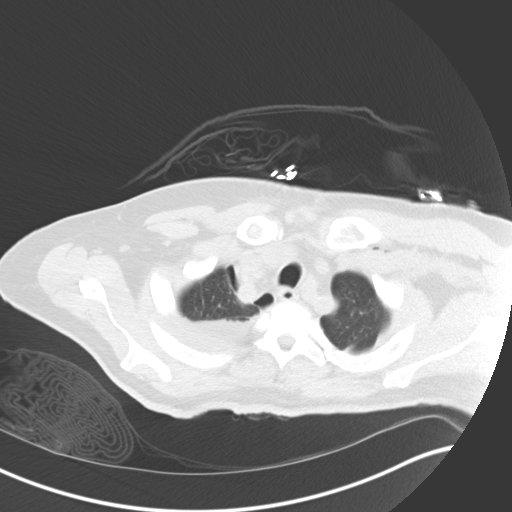
[im 83/94  soft-tissue]
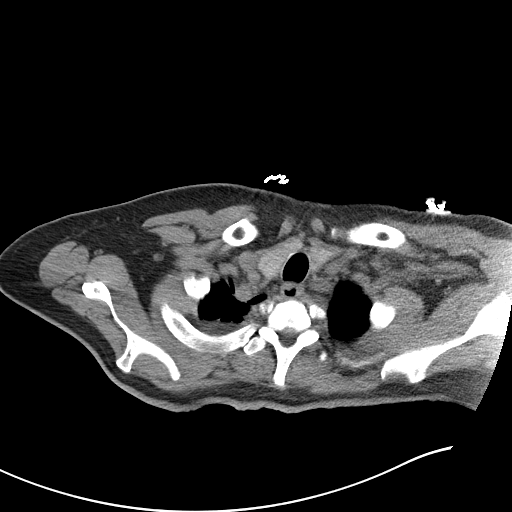
[im 83/94  lung]
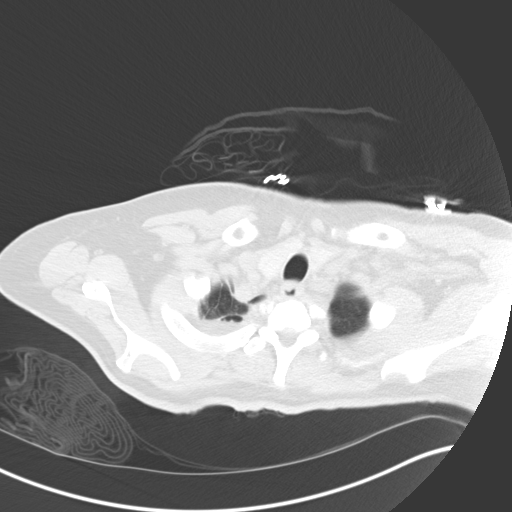
[im 87/94  lung]
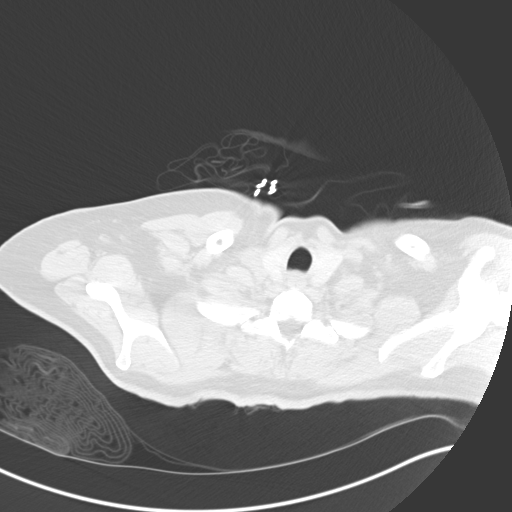
[im 90/94  soft-tissue]
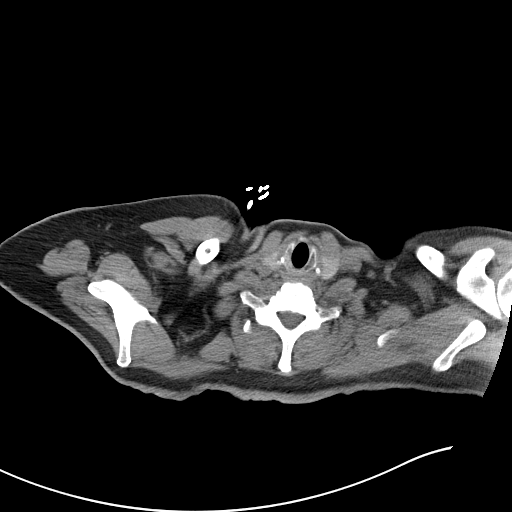
[im 90/94  lung]
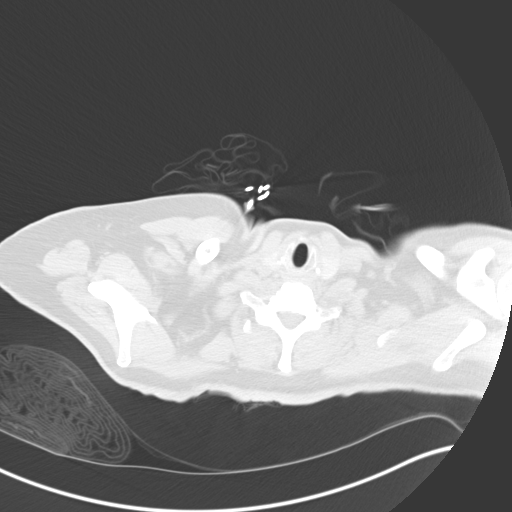

[15 of 32 positions shown; findings below may reference images not displayed]

EXAM:
CT RIGHT CHEST TUBE INSERTION

MEDICATIONS:
1% LIDOCAINE LOCAL

ANESTHESIA/SEDATION:
Fentanyl 50 mcg IV; Versed 1.0 mg IV

Moderate Sedation Time:  10 MINUTES

The patient was continuously monitored during the procedure by the
interventional radiology nurse under my direct supervision.

COMPLICATIONS:
None immediate.

PROCEDURE:
Informed written consent was obtained from the patient after a
thorough discussion of the procedural risks, benefits and
alternatives. All questions were addressed. Maximal Sterile Barrier
Technique was utilized including caps, mask, sterile gowns, sterile
gloves, sterile drape, hand hygiene and skin antiseptic. A timeout
was performed prior to the initiation of the procedure.

Patient positioned supine. Noncontrast localization CT performed.
The right hydropneumothorax was localized inferiorly. Right lower
intercostal space marked in the mid axillary line.

Under sterile conditions and local anesthesia, CT guidance was
utilized to advance an 18 gauge 10 cm introducer needle into the
right hydropneumothorax inferiorly. Needle position confirmed with
CT. Amplatz guidewire inserted. Tract dilatation performed to insert
a 14 French drain. Retention loop formed within the pleural
effusion. Catheter position confirmed with CT. 30 cc bloody fluid
aspirated. Sample sent for culture. Catheter secured with a Prolene
suture and connected to external Pleur-Evac. Sterile dressing
applied. No immediate complication. Patient tolerated the procedure
well.
IMPRESSION: Successful CT-guided right chest 14 French drain insertion for a
persistent right hydropneumothorax.

## 2018-08-12 NOTE — Progress Notes (Signed)
Cardiology Office Note    Date:  08/18/2018   ID:  Janos, Shampine 06/10/1950, MRN [id number]  PCP:  [name], MD  Cardiologist:  Dr. Martinique   Chief Complaint  Patient presents with  . Coronary Artery Disease    History of Present Illness:  Lonnie Hansen is a 68 y.o. 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 In 2017 chest x-ray showed aortic root enlargement. He underwent CT that showed dilatation of proximal aorta. Descending aorta was normal. Echocardiogram obtained on 09/20/2015 showed EF 60-65%, moderate to severe AI, grade 1 DD. He was evaluated by Dr. Servando Snare. More recently, CT scan showed aortic root had enlarged to 5.2 cm. Given the size of aortic root and the degree of AI, it was recommended he undergo aortic root grafting and AVR. He underwent preoperative evaluation with cardiac catheterization on 04/14/2016 which showed moderate to severe AI, EF 45%, 80% stenosis in a nondominant RCA, 50% distal LAD. He eventually underwent the planned procedure with ascending aortic root replacement using 32 mm Gelweave Valsalva Graft and AVR using 29 mm Edwards Perimount Magna Ease aortic bioprosthesis valve on 09/15/2016 by Dr. Servando Snare.  Postoperatively, he did have some volume overload that responded well to diuretic. He also had postoperative thrombocytopenia as well. He was placed on oral amiodarone for postoperative atrial fibrillation.  When seen in follow up chest x-ray showed recurrent right pleural effusion.Echo on 10/09/2016 showed only a trivial amount of pericardial effusion, EF 60-65%. He underwent right thoracentesis again on 10/23/2016 with the removal 2 L of dark blood. Post procedure, he developed a right pneumothorax related to incomplete reexpansion of the right middle and lower lobe of the lung. He was admitted on 10/30/2016 for pigtail drain placement, last chest x-ray obtained on 11/04/2016 showed  improvement of right pleural effusion and resolution of right pneumothorax. Followed by Dr. Servando Snare - Repeat CT chest in April 2019 stable.   On follow up today he is doing OK. [facility]. He was scheduled for right hip surgery but this is also on hold due to the pandemic. As a result he is inactive and has gained 19 lbs. Unable to work.  He has a number of complaints today. Notes more dyspnea and difficulty taking a good deep breath. Feels occasional skipped beat. No chest pain. Has some intermittent tingling down his right arm. He has a cough with some sputum production. Has some sinus drainage. Finds it hard to get to sleep. He is anxious. Wears CPAP intermittently.   Past Medical History:  Diagnosis Date  . Anemia   . Arthritis   . Asymptomatic bilateral carotid artery stenosis 08/2015   1-39%   . Blood dyscrasia    "trouble with my bloods clotting"  . Bruising    on the skin states due to platelets or sometimes low or high  . Cataracts, bilateral   . 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 Coronary artery disease   . Diverticulosis   . Enlarged aorta ([place])   . Enlarged prostate    slightly  . GERD (gastroesophageal reflux disease)    takes Pantoprazole daily as needed  . Glaucoma    uses eye drops daily  . Headache   . History of colon polyps    benign  . History of kidney stones   .  Hyperlipidemia    no on any meds  . Hypertension    takes Amlodipine and Atenolol daily  . Joint pain   . Joint swelling   . Nocturia   . OSA on CPAP   . Sleep apnea   . Vocal cord nodule    pt. states  it's a" growth on vocal cord"    Past Surgical History:  Procedure Laterality Date  . AORTIC ARCH ANGIOGRAPHY N/A 04/16/2016   Procedure: Aortic Arch Angiography;  Surgeon:  M Martinique, MD;  Location: [facility];  Service: Cardiovascular;  Laterality: N/A;  . AORTIC VALVE REPLACEMENT N/A 09/15/2016    Procedure: AORTIC VALVE REPLACEMENT (AVR);  Surgeon: Grace Isaac, MD;  Location: [facility];  Service: Open Heart Surgery;  Laterality: N/A;  Using 48mm Edwards Perimount Magna Ease Aortic Bioprosthesis Valve  . ASCENDING AORTIC ROOT REPLACEMENT N/A 09/15/2016   Procedure: ASCENDING AORTIC ROOT REPLACEMENT;  Surgeon: Grace Isaac, MD;  Location: [facility];  Service: Open Heart Surgery;  Laterality: N/A;  Using 85mm Gelweave Valsalva Graft  . COLONOSCOPY    . COLONOSCOPY WITH ESOPHAGOGASTRODUODENOSCOPY (EGD)    . IR THORACENTESIS ASP PLEURAL SPACE W/IMG GUIDE  10/23/2016  . MICROLARYNGOSCOPY Right 09/20/2017   Procedure: MICROLARYNGOSCOPY WITH  EXCISION OF VOCAL CORD LESION;  Surgeon: Leta Baptist, MD;  Location: [facility];  Service: ENT;  Laterality: Right;  . MULTIPLE EXTRACTIONS WITH ALVEOLOPLASTY N/A 06/10/2016   Procedure: Extraction of tooth #'s 2,8,13,15, and 29  with alveoloplasty, maxillary right and left buccal exostoses reductions, and gross debridement of remaining teeth.;  Surgeon: Lenn Cal, DDS;  Location: [facility];  Service: Oral Surgery;  Laterality: N/A;  . RIGHT/LEFT HEART CATH AND CORONARY ANGIOGRAPHY N/A 04/16/2016   Procedure: Right/Left Heart Cath and Coronary Angiography;  Surgeon:  M Martinique, MD;  Location: [facility];  Service: Cardiovascular;  Laterality: N/A;  . TEE WITHOUT CARDIOVERSION N/A 09/15/2016   Procedure: TRANSESOPHAGEAL ECHOCARDIOGRAM (TEE);  Surgeon: Grace Isaac, MD;  Location: [facility];  Service: Open Heart Surgery;  Laterality: N/A;    Current Medications: Outpatient Medications Prior to Visit  Medication Sig Dispense Refill  . amLODipine (NORVASC) 10 MG tablet Take 10 mg by mouth daily.    Marland Kitchen aspirin EC 81 MG EC tablet Take 1 tablet (81 mg total) by mouth daily.    Marland Kitchen atenolol (TENORMIN) 25 MG tablet Take 0.5 tablets (12.5 mg total) by mouth 2 (two) times daily. 30 tablet 1  . latanoprost (XALATAN) 0.005 % ophthalmic  solution Place 1 drop into both eyes at bedtime.    . Multiple Vitamin (MULTIVITAMIN WITH MINERALS) TABS tablet Take 1 tablet by mouth daily.    . rosuvastatin (CRESTOR) 10 MG tablet Take 1 tablet (10 mg total) by mouth daily. 90 tablet 3  . traMADol (ULTRAM) 50 MG tablet Take 1 tablet (50 mg total) by mouth every 6 (six) hours as needed. 60 tablet 0  . Ferrous Sulfate (IRON) 325 (65 Fe) MG TABS Take 1 tablet by mouth daily.    . tamsulosin (FLOMAX) 0.4 MG CAPS capsule Take 0.4 mg by mouth daily.     No facility-administered medications prior to visit.      Allergies:   Patient has no known allergies.   Social History   Socioeconomic History  . Marital status: Married    Spouse name: Not on file  . Number of children: 1  . Years of education: Not on file  .  Highest education level: Not on file  Occupational History  . Occupation: Musician  Social Needs  . Financial resource strain: Not on file  . Food insecurity    Worry: Not on file    Inability: Not on file  . Transportation needs    Medical: Not on file    Non-medical: Not on file  Tobacco Use  . Smoking status: Former Smoker    Packs/day: 1.00    Years: 25.00    Pack years: 25.00    Types: Cigarettes  . Smokeless tobacco: Never Used  Substance and Sexual Activity  . Alcohol use: No    Alcohol/week: 0.0 standard drinks  . Drug use: No  . Sexual activity: Not on file  Lifestyle  . Physical activity    Days per week: Not on file    Minutes per session: Not on file  . Stress: Not on file  Relationships  . Social Herbalist on phone: Not on file    Gets together: Not on file    Attends religious service: Not on file    Active member of club or organization: Not on file    Attends meetings of clubs or organizations: Not on file    Relationship status: Not on file  Other Topics Concern  . 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     Family  History:  The patient's family history includes Heart attack in his father; Heart disease in his father; Thyroid disease in his sister.   ROS:   Please see the history of present illness.    ROS All other systems reviewed and are negative.   PHYSICAL EXAM:   VS:  BP 118/82   Pulse 61   Temp (!) 96.4 F (35.8 C)   Ht 5\' 9"  (1.753 m)   Wt 217 lb 9.6 oz (98.7 kg)   SpO2 96%   BMI 32.13 kg/m    GENERAL:  Well appearing, obese BM in NAD. Walks with a cane. HEENT:  PERRL, EOMI, sclera are clear. Oropharynx is clear. NECK:  No jugular venous distention, carotid upstroke brisk and symmetric, no bruits, no thyromegaly or adenopathy LUNGS:  Clear to auscultation bilaterally CHEST:  Unremarkable HEART:  RRR,  PMI not displaced or sustained,S1 and S2 within normal limits, no S3, no S4: no clicks, no rubs, no murmurs ABD:  Soft, nontender. BS +, no masses or bruits. No hepatomegaly, no splenomegaly EXT:  2 + pulses throughout, no edema, no cyanosis no clubbing SKIN:  Warm and dry.  No rashes NEURO:  Alert and oriented x 3. Cranial nerves II through XII intact. PSYCH:  Cognitively intact      Wt Readings from Last 3 Encounters:  08/18/18 217 lb 9.6 oz (98.7 kg)  04/19/18 209 lb 8 oz (95 kg)  03/31/18 211 lb 12 oz (96 kg)      Studies/Labs Reviewed:   EKG:  EKG is  Not ordered today.     Recent Labs: 08/17/2018: ALT 15; BUN 14; Creatinine, Ser 0.95; Hemoglobin 13.1; Platelets 89; Potassium 3.7; Sodium 139; TSH 3.190   Lipid Panel    Component Value Date/Time   CHOL 164 08/17/2018 1202   TRIG 272 (H) 08/17/2018 1202   HDL 35 (L) 08/17/2018 1202   CHOLHDL 4.7 08/17/2018 1202   CHOLHDL 3.5 04/28/2010 0703   VLDL 14 04/28/2010 0703   LDLCALC 75  08/17/2018 1202    Additional studies/ records that were reviewed today include:   Echo 09/20/2015 LV EF: 60% - 65%  Study Conclusions  - Left ventricle: The cavity size was normal. There was moderate focal basal  hypertrophy of the septum. Systolic function was normal. The estimated ejection fraction was in the range of 60% to 65%. Wall motion was normal; there were no regional wall motion abnormalities. Doppler parameters are consistent with abnormal left ventricular relaxation (grade 1 diastolic dysfunction). - Aortic valve: There was moderate to severe regurgitation. - Aorta: Aortic root dimension: 43 mm (ED). - Ascending aorta: The ascending aorta was mildly dilated. - Right ventricle: The cavity size was mildly dilated. Wall thickness was normal.    Cath 04/16/2016 Conclusion     Mid RCA lesion, 80 %stenosed.  Ost LAD to Prox LAD lesion, 25 %stenosed.  Dist LAD lesion, 50 %stenosed.  There is mild left ventricular systolic dysfunction.  LV end diastolic pressure is normal.  The left ventricular ejection fraction is 45-50% by visual estimate.  There is no mitral valve regurgitation.  There is no aortic valve stenosis.  LV end diastolic pressure is normal.  1. 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 Mild LV dysfunction. EF 45%. 3. Ascending thoracic aortic aneurysm. 4. 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 5. Normal Right heart And LV filling pressures 6. Normal cardiac output.   Plan: Aortic root grafting and AVR.      Echo 09/16/2016 LV EF: 55% - 60%  Study Conclusions  - Left ventricle: The cavity size was normal. Wall thickness was increased in a pattern of severe LVH. Systolic function was normal. The estimated ejection fraction was in the range of 55% to 60%. - Aortic valve: Valve area (VTI): 4.29 cm^2. Valve area (Vmax): 4.15 cm^2. Valve area (Vmean): 3.88 cm^2. - Mitral valve: Severely calcified annulus. Valve area by continuity equation (using LVOT flow): 3.35 cm^2. - Pericardium, extracardiac: Localized moderate pericardial effusion posterior to the LA.    Limited Echo 10/09/2016 LV EF: 60% -    65%  Study Conclusions  - Left ventricle: The cavity size was normal. Wall thickness was   increased in a pattern of mild LVH. Systolic function was normal.   The estimated ejection fraction was in the range of 60% to 65%. - Aortic valve: AV prosthesis appears to open well Peak and mean   gradients through the valve are 9 and 5 mm Hg respectively. - Mitral valve: Calcified annulus. Mildly thickened leaflets .   There was mild regurgitation. - Pulmonary arteries: PA peak pressure: 37 mm Hg (S). - Pericardium, extracardiac: A trivial pericardial effusion was   identified.   R thoracentesis 10/23/2016 Post CXR: IMPRESSION: Resolution of right-sided pleural effusion. Right pneumothorax is noted related to incomplete re-expansion of the right middle and lower lobes. This does not represent a true pneumothorax. Continued follow-up is recommended.  CT ANGIOGRAPHY CHEST WITH CONTRAST  TECHNIQUE: Multidetector CT imaging of the chest was performed using the standard protocol during bolus administration of intravenous contrast. Multiplanar CT image reconstructions and MIPs were obtained to evaluate the vascular anatomy.  CONTRAST:  66mL ISOVUE-370 IOPAMIDOL (ISOVUE-370) INJECTION 76%  COMPARISON:  03/26/2016, 01/30/2014  FINDINGS: Cardiovascular: There are changes consistent with aortic valve replacement. Coronary calcifications are noted. Aortic root repair is noted as well. At the level of the sinus of Valsalva aorta measures approximately 4.2 cm. The ascending component is widely patent consistent with the repair. The thoracic arch  and descending thoracic aorta show atherosclerotic change. A focal outpouching associated with some soft atherosclerotic plaque is noted best seen on image number 96 of series 16 and image number 47 of series 7. This is slightly more prominent than on the previous preoperative exam. No dissection is noted. No significant cardiac enlargement is  seen.  Mediastinum/Nodes: Thoracic inlet is within normal limits. No significant hilar or mediastinal adenopathy is noted. The esophagus is within normal limits.  Lungs/Pleura: The lungs are well aerated bilaterally. No focal infiltrate or sizable effusion is seen. No parenchymal nodules are noted.  Upper Abdomen: Visualized upper abdomen demonstrates a large right parapelvic cyst as well as a large lamellated gallstone. Stable partially calcified partially cystic lesion in the left kidney is again seen similar to that noted in 2016.  Musculoskeletal: Mild degenerative changes of the thoracic spine are seen. No acute bony abnormality is noted. Prior median sternotomy is seen.  Review of the MIP images confirms the above findings.  IMPRESSION: Status post aortic valve replacement and ascending aortic repair without complicating factors.  Focal mild outpouching associated with some soft atherosclerotic plaque in the proximal descending thoracic aorta. This is slightly more prominent than that seen on the prior exam. Attention on follow-up examinations is recommended. No true dissection is seen.  Stable changes in the upper abdomen without complication.   Electronically Signed   By: Inez Catalina M.D.   On: 05/04/2017 13:49  ASSESSMENT:    No diagnosis found.   PLAN:  In order of problems listed above:  1. H/o Aortic root repair and AVR with a tissue valve.:   Repeat Echo in September 2018 showed good prosthetic valve function. Repeat CT in April showed mild dilation of the descending aorta but normal graft. Exam without significant murmur.   2. Recurrent right pleural effusion- resolved.   3. CAD: asymptomatic.  80% mid nondominant RCA and a 50% distal LAD lesion on previous cardiac catheterization.  Continue medical therapy.   4. Hypertension: Blood pressure is under good control.  5. Hyperlipidemia: Cholesterol looks OK but triglycerides are high related  to significant weight gain. Needs to focus on controlling weight.   6.   ITP followed by Dr [name].   7.   Dyspnea. I think this is mostly related to deconditioning and weight gain. No volume excess.     Medication Adjustments/Labs and Tests Ordered: Current medicines are reviewed at length with the patient today.  Concerns regarding medicines are outlined above.  Medication changes, Labs and Tests ordered today are listed in the Patient Instructions below. Patient Instructions  Work on losing weight  Continue your current medication  Follow up in 6 months     Signed,  Martinique, MD  08/18/2018 5:31 PM    [facility] [address], [place], [place]  [address] Phone: [phone]; Fax: [phone]

## 2018-08-13 IMAGING — CR DG CHEST 1V PORT
1 series · 1 of 1 positions shown · non-contrast
Comparison: 10/30/2016 and 10/29/2016

CLINICAL DATA: Right sided pneumothorax.

EXAM:
PORTABLE CHEST 1 VIEW

[AP]
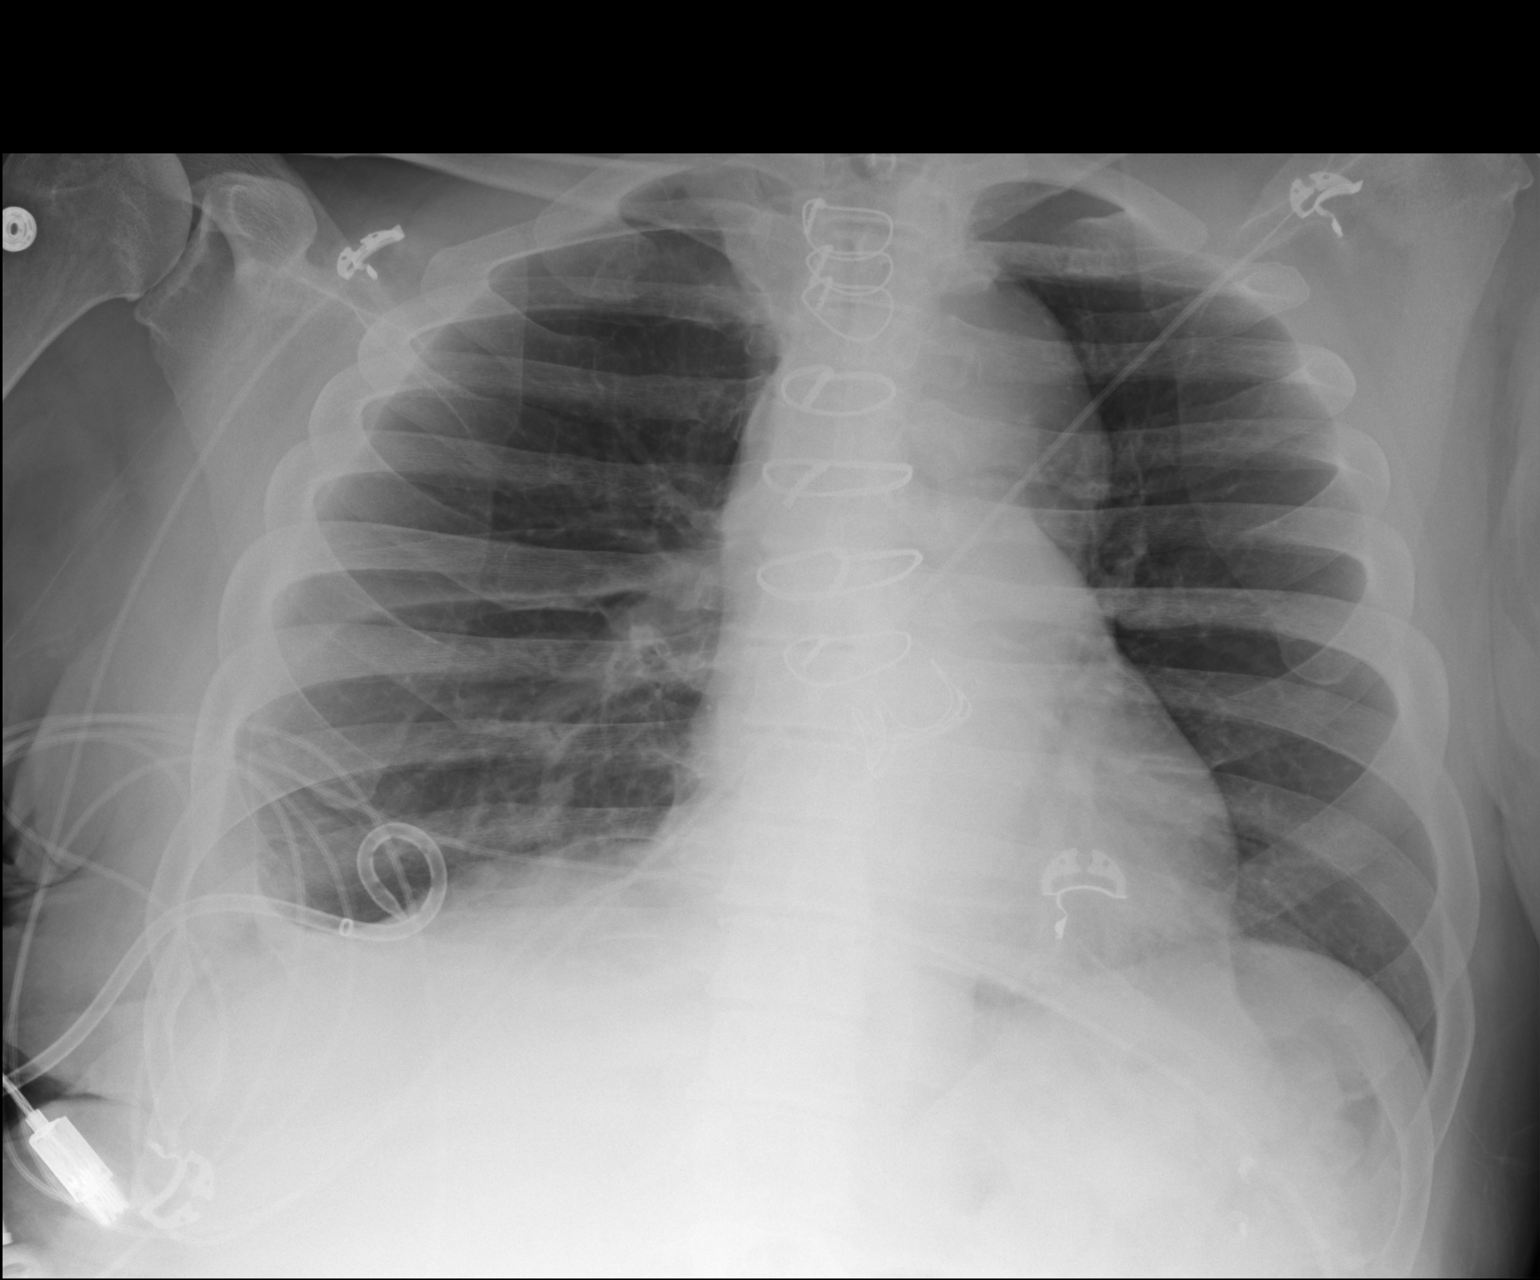

[1 of 1 positions shown; findings below may reference images not displayed]

FINDINGS: Stable position of the pigtail drain at the base of the right chest.
There is concern for a residual pleural pocket containing gas at the
lateral base of the right chest. Right upper lung is clear. Trachea
is midline. Heart and mediastinum are stable. Left lung is clear.
IMPRESSION: Stable position of the right chest drain. Small residual
pneumothorax at the right lung base.

## 2018-08-16 ENCOUNTER — Telehealth: Payer: Self-pay | Admitting: Cardiology

## 2018-08-16 DIAGNOSIS — Z79899 Other long term (current) drug therapy: Secondary | ICD-10-CM

## 2018-08-16 DIAGNOSIS — I480 Paroxysmal atrial fibrillation: Secondary | ICD-10-CM

## 2018-08-16 DIAGNOSIS — I2510 Atherosclerotic heart disease of native coronary artery without angina pectoris: Secondary | ICD-10-CM

## 2018-08-16 DIAGNOSIS — I10 Essential (primary) hypertension: Secondary | ICD-10-CM

## 2018-08-16 DIAGNOSIS — D649 Anemia, unspecified: Secondary | ICD-10-CM

## 2018-08-16 DIAGNOSIS — E785 Hyperlipidemia, unspecified: Secondary | ICD-10-CM

## 2018-08-16 IMAGING — DX DG CHEST 1V PORT
1 series · 1 of 1 positions shown · non-contrast
Comparison: 11/03/2016 at 3723 hours

CLINICAL DATA: Chest tube removal.

EXAM:
PORTABLE CHEST 1 VIEW

[chest ap]
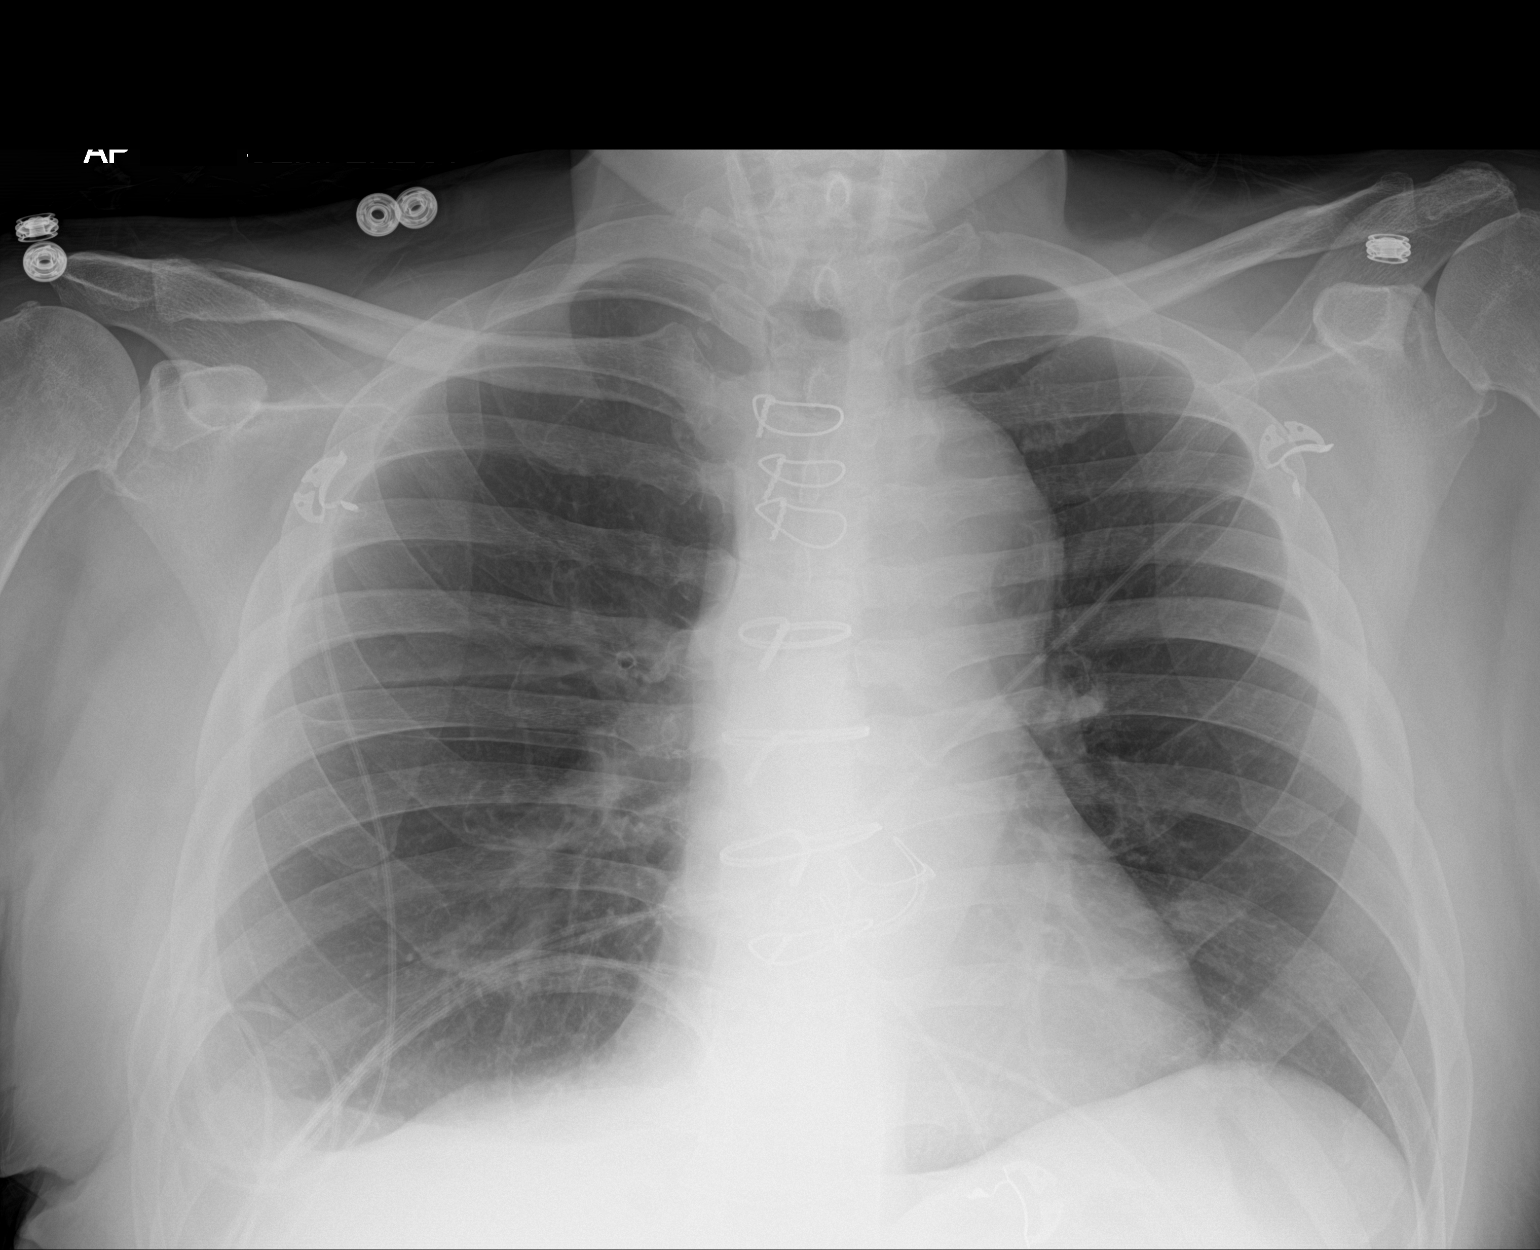

[1 of 1 positions shown; findings below may reference images not displayed]

FINDINGS: Sequelae of aortic valve replacement are again identified. The
cardiomediastinal silhouette is unchanged with an ectatic appearance
of the descending thoracic aorta. The right-sided chest tube has
been removed. A small basilar right hydropneumothorax is similar to
the prior study. The left lung is clear.
IMPRESSION: Unchanged small right hydropneumothorax following chest tube
removal.

## 2018-08-16 NOTE — Telephone Encounter (Signed)
Spoke with pt who report he has an appointment with Dr. Martinique on 7/23 and would like to have lab work done prior to appointment for MD to review. He report he hasn't had complete blood work in over a year and would like to have everything done. Pt also state he has c/o of sob but report he has gained 20 lbs due to not being able to workout and think it could be related to that.   Will route to MD to make aware.

## 2018-08-16 NOTE — Telephone Encounter (Signed)
Spoke to patient he will have fasting labs tomorrow 7/22.

## 2018-08-16 NOTE — Telephone Encounter (Signed)
I would check a CBC, CMET, lipid panel and TSH.  Elasia Furnish Martinique MD, [facility]

## 2018-08-16 NOTE — Telephone Encounter (Signed)
Patient has appt on Thursday, he wants to know if labs are going to be done at that visit and if he needs to fast.  He also would like a letter stating due to his heart condition it's not advisable for him to work at this time due to [place].

## 2018-08-17 DIAGNOSIS — I480 Paroxysmal atrial fibrillation: Secondary | ICD-10-CM | POA: Diagnosis not present

## 2018-08-17 DIAGNOSIS — I10 Essential (primary) hypertension: Secondary | ICD-10-CM | POA: Diagnosis not present

## 2018-08-17 DIAGNOSIS — D649 Anemia, unspecified: Secondary | ICD-10-CM | POA: Diagnosis not present

## 2018-08-17 DIAGNOSIS — Z79899 Other long term (current) drug therapy: Secondary | ICD-10-CM | POA: Diagnosis not present

## 2018-08-17 DIAGNOSIS — I2510 Atherosclerotic heart disease of native coronary artery without angina pectoris: Secondary | ICD-10-CM | POA: Diagnosis not present

## 2018-08-17 DIAGNOSIS — E785 Hyperlipidemia, unspecified: Secondary | ICD-10-CM | POA: Diagnosis not present

## 2018-08-18 ENCOUNTER — Telehealth: Payer: Self-pay | Admitting: Cardiology

## 2018-08-18 ENCOUNTER — Ambulatory Visit (INDEPENDENT_AMBULATORY_CARE_PROVIDER_SITE_OTHER): Payer: Medicare Other | Admitting: Cardiology

## 2018-08-18 ENCOUNTER — Encounter: Payer: Self-pay | Admitting: Cardiology

## 2018-08-18 ENCOUNTER — Other Ambulatory Visit: Payer: Self-pay

## 2018-08-18 VITALS — BP 118/82 | HR 61 | Temp 96.4°F | Ht 69.0 in | Wt 217.6 lb

## 2018-08-18 DIAGNOSIS — Z8679 Personal history of other diseases of the circulatory system: Secondary | ICD-10-CM

## 2018-08-18 DIAGNOSIS — I10 Essential (primary) hypertension: Secondary | ICD-10-CM

## 2018-08-18 DIAGNOSIS — I2510 Atherosclerotic heart disease of native coronary artery without angina pectoris: Secondary | ICD-10-CM

## 2018-08-18 DIAGNOSIS — Z952 Presence of prosthetic heart valve: Secondary | ICD-10-CM

## 2018-08-18 DIAGNOSIS — Z9889 Other specified postprocedural states: Secondary | ICD-10-CM | POA: Diagnosis not present

## 2018-08-18 LAB — LIPID PANEL
Chol/HDL Ratio: 4.7 ratio (ref 0.0–5.0)
Cholesterol, Total: 164 mg/dL (ref 100–199)
HDL: 35 mg/dL — ABNORMAL LOW (ref >39)
LDL Calculated: 75 mg/dL (ref 0–99)
Triglycerides: 272 mg/dL — ABNORMAL HIGH (ref 0–149)
VLDL Cholesterol Cal: 54 mg/dL — ABNORMAL HIGH (ref 5–40)

## 2018-08-18 LAB — CBC WITH DIFFERENTIAL/PLATELET
Basophils Absolute: 0 10*3/uL (ref 0.0–0.2)
Basos: 0 %
EOS (ABSOLUTE): 0.3 10*3/uL (ref 0.0–0.4)
Eos: 5 %
Hematocrit: 38.6 % (ref 37.5–51.0)
Hemoglobin: 13.1 g/dL (ref 13.0–17.7)
Immature Grans (Abs): 0 10*3/uL (ref 0.0–0.1)
Immature Granulocytes: 1 %
Lymphocytes Absolute: 1.3 10*3/uL (ref 0.7–3.1)
Lymphs: 20 %
MCH: 31 pg (ref 26.6–33.0)
MCHC: 33.9 g/dL (ref 31.5–35.7)
MCV: 92 fL (ref 79–97)
Monocytes Absolute: 0.7 10*3/uL (ref 0.1–0.9)
Monocytes: 11 %
Neutrophils Absolute: 4.1 10*3/uL (ref 1.4–7.0)
Neutrophils: 63 %
Platelets: 89 10*3/uL — CL (ref 150–450)
RBC: 4.22 x10E6/uL (ref 4.14–5.80)
RDW: 14.6 % (ref 11.6–15.4)
WBC: 6.5 10*3/uL (ref 3.4–10.8)

## 2018-08-18 LAB — COMPREHENSIVE METABOLIC PANEL
ALT: 15 IU/L (ref 0–44)
AST: 25 IU/L (ref 0–40)
Albumin/Globulin Ratio: 1.8 (ref 1.2–2.2)
Albumin: 4.7 g/dL (ref 3.8–4.8)
Alkaline Phosphatase: 51 IU/L (ref 39–117)
BUN/Creatinine Ratio: 15 (ref 10–24)
BUN: 14 mg/dL (ref 8–27)
Bilirubin Total: 0.5 mg/dL (ref 0.0–1.2)
CO2: 24 mmol/L (ref 20–29)
Calcium: 9.1 mg/dL (ref 8.6–10.2)
Chloride: 98 mmol/L (ref 96–106)
Creatinine, Ser: 0.95 mg/dL (ref 0.76–1.27)
GFR calc Af Amer: 95 mL/min/{1.73_m2} (ref >59)
GFR calc non Af Amer: 82 mL/min/{1.73_m2} (ref >59)
Globulin, Total: 2.6 g/dL (ref 1.5–4.5)
Glucose: 84 mg/dL (ref 65–99)
Potassium: 3.7 mmol/L (ref 3.5–5.2)
Sodium: 139 mmol/L (ref 134–144)
Total Protein: 7.3 g/dL (ref 6.0–8.5)

## 2018-08-18 LAB — TSH: TSH: 3.19 u[IU]/mL (ref 0.450–4.500)

## 2018-08-18 NOTE — Telephone Encounter (Signed)

## 2018-08-18 NOTE — Patient Instructions (Signed)
Work on losing [employer]  Continue your current medication  Follow up in 6 months

## 2018-09-09 ENCOUNTER — Telehealth (HOSPITAL_COMMUNITY): Payer: Self-pay | Admitting: Internal Medicine

## 2018-09-15 DIAGNOSIS — Z1331 Encounter for screening for depression: Secondary | ICD-10-CM | POA: Diagnosis not present

## 2018-09-15 DIAGNOSIS — I10 Essential (primary) hypertension: Secondary | ICD-10-CM | POA: Diagnosis not present

## 2018-09-15 DIAGNOSIS — G4733 Obstructive sleep apnea (adult) (pediatric): Secondary | ICD-10-CM | POA: Diagnosis not present

## 2018-09-15 DIAGNOSIS — M25551 Pain in right hip: Secondary | ICD-10-CM | POA: Diagnosis not present

## 2018-09-15 DIAGNOSIS — E785 Hyperlipidemia, unspecified: Secondary | ICD-10-CM | POA: Diagnosis not present

## 2018-09-15 DIAGNOSIS — I2510 Atherosclerotic heart disease of native coronary artery without angina pectoris: Secondary | ICD-10-CM | POA: Diagnosis not present

## 2018-09-15 DIAGNOSIS — D696 Thrombocytopenia, unspecified: Secondary | ICD-10-CM | POA: Diagnosis not present

## 2018-09-19 ENCOUNTER — Telehealth: Payer: Self-pay | Admitting: Cardiology

## 2018-09-19 NOTE — Telephone Encounter (Signed)
Spoke to patient he stated he needs 2 letters.Stated one letter stating he cannot work during Lonnie Hansen pandemic and other letter stating ok to exercise at [facility].Advised I will have Dr.Jordan sign letters and I will put in mail this afternoon.

## 2018-09-19 NOTE — Telephone Encounter (Signed)
New Message:     Please call, he says he wants to talk to you about some paperwork please.

## 2018-09-21 ENCOUNTER — Ambulatory Visit (INDEPENDENT_AMBULATORY_CARE_PROVIDER_SITE_OTHER): Payer: Medicare Other

## 2018-09-21 ENCOUNTER — Ambulatory Visit (INDEPENDENT_AMBULATORY_CARE_PROVIDER_SITE_OTHER): Payer: Medicare Other | Admitting: Orthopaedic Surgery

## 2018-09-21 ENCOUNTER — Other Ambulatory Visit: Payer: Self-pay

## 2018-09-21 ENCOUNTER — Encounter: Payer: Self-pay | Admitting: Orthopaedic Surgery

## 2018-09-21 DIAGNOSIS — R2242 Localized swelling, mass and lump, left lower limb: Secondary | ICD-10-CM

## 2018-09-21 DIAGNOSIS — M1611 Unilateral primary osteoarthritis, right hip: Secondary | ICD-10-CM | POA: Diagnosis not present

## 2018-09-21 DIAGNOSIS — M25551 Pain in right hip: Secondary | ICD-10-CM

## 2018-09-21 DIAGNOSIS — I2510 Atherosclerotic heart disease of native coronary artery without angina pectoris: Secondary | ICD-10-CM | POA: Diagnosis not present

## 2018-09-21 NOTE — Progress Notes (Signed)
Office Visit Note   Patient: Lonnie Hansen           Date of Birth: 07/07/1950           MRN: [id number] Visit Date: 09/21/2018              Requested by: [name], MD [place],  [place] [id number] PCP: [name], MD   Assessment & Plan: Visit Diagnoses:  1. Pain in right hip   2. Foot mass, left   3. Unilateral primary osteoarthritis, right hip     Plan: I do feel a hip replacement is appropriate for his right hip.  He would like to get his "cardiac issues" straightened out prior to proceeding with the surgery but he does feel he needs it in the near future due to quality of life and mobility purposes and pain control purposes and I agree with this as well.  When I read his cardiologist note it sounds like he is stabilizing these conditions and we can consider surgery under spinal anesthesia.  Would still like cardiac clearance for the surgery.  Apparently he is seeing Dr. Servando Snare in the near future as follow-up for his previous heart surgery.  Given the expanding mass on the plantar aspect of his left foot, a MRI with contrast is warranted to assess the soft tissue mass to make sure that it is a benign process.  He will call our office for follow-up once we have this MRI obtained.  All question concerns were answered addressed.  We will work-up some of his other multiple musculoskeletal complaints after we address these more important issues.   Follow-Up Instructions: No follow-ups on file.   Orders:  Orders Placed This Encounter  Procedures  . XR HIP UNILAT W OR W/O PELVIS 1V RIGHT  . XR Foot Complete Left   No orders of the defined types were placed in this encounter.     Procedures: No procedures performed   Clinical Data: No additional findings.   Subjective: Chief Complaint  Patient presents with  . Right Hip - Pain  The patient is actually well-known to me.  When I saw him several years ago he had debilitating arthritis in his right  hip and we are going to schedule hip replacement surgery.  However, he ended up having significant heart issues that required open heart surgery.  This related to his aorta and aortic valve.  He is followed by Dr. Servando Snare as well as Dr. Peter Martinique who is his cardiologist.  I was able to review his medical records see that he has a large list of medical issues but these seem to be stabilizing.  He reports that he is not on any blood thinning medication.  His pain in his right hip is become daily.  It is definitely affecting his mobility, his quality of life, and his activities daily living.  He does ambulate with a cane.  When I saw him before his hip disease was severe.  He is tried and failed other forms of conservative treatment and does feel about ready to proceed with hip replacement on the right side.  Over the last year he has developed a mass on the plantar aspect of his left foot that he wants [place] to check out today.  He said this mass is been slowly growing but is not painful.  He also has been having some numbness and tingling in his upper extremity on the left side.  HPI  Review of Systems Today he currently denies any chest pain, shortness of breath, fever, chills, nausea, vomiting  Objective: Vital Signs: There were no vitals taken for this visit.  Physical Exam He is alert and oriented and in no acute distress he does ambulate with a cane. Ortho Exam Examination of his right hip essentially shows no range of motion of that hip.  Is very stiff and painful with attempts of motion.  He is shorter on his right leg than the left.  Examination of his left foot does show a palpable mass in the soft tissue on the plantar aspect of his foot which is at least 4 cm x 3 cm.  It is not tender to palpation there is no redness. Specialty Comments:  No specialty comments available.  Imaging: Xr Foot Complete Left  Result Date: 09/21/2018 3 views of the left foot show an obvious soft tissue mass  in the plantar aspect of the foot  Xr Hip Unilat W Or W/o Pelvis 1v Right  Result Date: 09/21/2018 An AP pelvis and lateral the right hip show severe end-stage arthritis of of the right hip.  There is complete loss of the joint space.  There is shortening of the hip.  There are para-articular osteophytes.  There are sclerotic and cystic changes throughout the hip with collapse of the femoral head.    PMFS History: Patient Active Problem List   Diagnosis Date Noted  . Unilateral primary osteoarthritis, 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  . 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 Obstructive sleep apnea treated with continuous positive airway pressure (CPAP) 03/04/2017  . Vocal cord nodule   . Sleep apnea   . Nocturia   . Joint swelling   . Joint pain   . Hypertension   . History of kidney stones   . History of colon polyps   . Headache   . Glaucoma   . GERD (gastroesophageal reflux disease)   . Enlarged prostate   . Enlarged aorta ([place])   . Diverticulosis   . Coronary artery disease   . Cataracts, bilateral   . Bruising   . Blood dyscrasia   . Arthritis   . Anemia   . Hydropneumothorax 10/29/2016  . S/P AVR 09/15/2016  . Coronary artery disease involving native coronary artery of native heart without angina pectoris 04/29/2016  . Aortic insufficiency 04/01/2016  . Thoracic aortic aneurysm ([place]) 09/18/2015  . HTN (hypertension) 09/18/2015  . Hyperlipidemia 09/18/2015  . Asymptomatic bilateral carotid artery stenosis 08/27/2015   Past Medical History:  Diagnosis Date  . Anemia   . Arthritis   . Asymptomatic bilateral carotid artery stenosis 08/2015   1-39%   . Blood dyscrasia    "trouble with my bloods clotting"  . Bruising    on the skin states due to platelets or sometimes low or high  . Cataracts, bilateral   . 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 Coronary artery disease   . Diverticulosis   . Enlarged aorta ([place])   . Enlarged  prostate    slightly  . GERD (gastroesophageal reflux disease)    takes Pantoprazole daily as needed  . Glaucoma    uses eye drops daily  . Headache   . History of colon polyps    benign  . History of kidney stones   . Hyperlipidemia    no on any meds  . Hypertension    takes Amlodipine and Atenolol daily  . Joint pain   . Joint  swelling   . Nocturia   . OSA on CPAP   . Sleep apnea   . Vocal cord nodule    pt. states  it's a" growth on vocal cord"    Family History  Problem Relation Age of Onset  . Heart disease Father   . Heart attack Father   . Thyroid disease Sister     Past Surgical History:  Procedure Laterality Date  . AORTIC ARCH ANGIOGRAPHY N/A 04/16/2016   Procedure: Aortic Arch Angiography;  Surgeon: [name] Martinique, MD;  Location: [facility];  Service: Cardiovascular;  Laterality: N/A;  . AORTIC VALVE REPLACEMENT N/A 09/15/2016   Procedure: AORTIC VALVE REPLACEMENT (AVR);  Surgeon: Grace Isaac, MD;  Location: [facility];  Service: Open Heart Surgery;  Laterality: N/A;  Using 26mm Edwards Perimount Magna Ease Aortic Bioprosthesis Valve  . ASCENDING AORTIC ROOT REPLACEMENT N/A 09/15/2016   Procedure: ASCENDING AORTIC ROOT REPLACEMENT;  Surgeon: Grace Isaac, MD;  Location: [facility];  Service: Open Heart Surgery;  Laterality: N/A;  Using 29mm Gelweave Valsalva Graft  . COLONOSCOPY    . COLONOSCOPY WITH ESOPHAGOGASTRODUODENOSCOPY (EGD)    . IR THORACENTESIS ASP PLEURAL SPACE W/IMG GUIDE  10/23/2016  . MICROLARYNGOSCOPY Right 09/20/2017   Procedure: MICROLARYNGOSCOPY WITH  EXCISION OF VOCAL CORD LESION;  Surgeon: Leta Baptist, MD;  Location: [facility];  Service: ENT;  Laterality: Right;  . MULTIPLE EXTRACTIONS WITH ALVEOLOPLASTY N/A 06/10/2016   Procedure: Extraction of tooth #'s 2,8,13,15, and 29  with alveoloplasty, maxillary right and left buccal exostoses reductions, and gross debridement of remaining teeth.;  Surgeon: Lenn Cal, DDS;   Location: [facility];  Service: Oral Surgery;  Laterality: N/A;  . RIGHT/LEFT HEART CATH AND CORONARY ANGIOGRAPHY N/A 04/16/2016   Procedure: Right/Left Heart Cath and Coronary Angiography;  Surgeon: [name] Martinique, MD;  Location: [facility];  Service: Cardiovascular;  Laterality: N/A;  . TEE WITHOUT CARDIOVERSION N/A 09/15/2016   Procedure: TRANSESOPHAGEAL ECHOCARDIOGRAM (TEE);  Surgeon: Grace Isaac, MD;  Location: [facility];  Service: Open Heart Surgery;  Laterality: N/A;   Social History   Occupational History  . Occupation: Musician  Tobacco Use  . Smoking status: Former Smoker    Packs/day: 1.00    Years: 25.00    Pack years: 25.00    Types: Cigarettes  . Smokeless tobacco: Never Used  Substance and Sexual Activity  . Alcohol use: No    Alcohol/week: 0.0 standard drinks  . Drug use: No  . Sexual activity: Not on file

## 2018-10-05 ENCOUNTER — Other Ambulatory Visit: Payer: Self-pay | Admitting: Cardiothoracic Surgery

## 2018-10-05 DIAGNOSIS — I712 Thoracic aortic aneurysm, without rupture, unspecified: Secondary | ICD-10-CM

## 2018-10-09 ENCOUNTER — Ambulatory Visit
Admission: RE | Admit: 2018-10-09 | Discharge: 2018-10-09 | Disposition: A | Discharge status: Home / Self Care | Payer: Medicare Other | Source: Ambulatory Visit | Attending: Orthopaedic Surgery | Admitting: Orthopaedic Surgery

## 2018-10-09 ENCOUNTER — Other Ambulatory Visit: Payer: Self-pay

## 2018-10-09 DIAGNOSIS — R2242 Localized swelling, mass and lump, left lower limb: Secondary | ICD-10-CM

## 2018-10-09 DIAGNOSIS — M19072 Primary osteoarthritis, left ankle and foot: Secondary | ICD-10-CM | POA: Diagnosis not present

## 2018-10-09 MED ORDER — GADOBENATE DIMEGLUMINE 529 MG/ML IV SOLN
20.0000 mL | Freq: Once | INTRAVENOUS | Status: AC | PRN
  Administered 2018-10-09: 20 mL via INTRAVENOUS

## 2018-10-10 DIAGNOSIS — L738 Other specified follicular disorders: Secondary | ICD-10-CM | POA: Diagnosis not present

## 2018-10-10 DIAGNOSIS — L218 Other seborrheic dermatitis: Secondary | ICD-10-CM | POA: Diagnosis not present

## 2018-10-12 ENCOUNTER — Encounter: Payer: Self-pay | Admitting: Orthopaedic Surgery

## 2018-10-12 ENCOUNTER — Ambulatory Visit (INDEPENDENT_AMBULATORY_CARE_PROVIDER_SITE_OTHER): Payer: Medicare Other | Admitting: Orthopaedic Surgery

## 2018-10-12 DIAGNOSIS — R2242 Localized swelling, mass and lump, left lower limb: Secondary | ICD-10-CM

## 2018-10-12 DIAGNOSIS — M1611 Unilateral primary osteoarthritis, right hip: Secondary | ICD-10-CM

## 2018-10-12 DIAGNOSIS — I2510 Atherosclerotic heart disease of native coronary artery without angina pectoris: Secondary | ICD-10-CM

## 2018-10-12 NOTE — Progress Notes (Signed)
The patient comes in today to go over an MRI of his left foot.  He had a large soft tissue mass on the plantar aspect of his left foot and we want to make sure that this is a benign process.  That foot mass is been there for a long period time.  He is also a well-known patient of mine he does have severe end-stage arthritis of his right hip.  We were going to perform hip replacement surgery on him sometime ago but then he had such a significant heart condition eventually requiring open heart surgery.  He also has a history of chronic low platelets.  He is now not on any blood thinning medication.  The MRI is reviewed with him of his left foot.  That showed a large benign-appearing plantar fibroma.  I gave him reassurance this is something that G1 Aleve alone because removing this often leads still wider local recurrence at the site.  As far as his hip goes, he wants to consider hip replacement surgery at the end of the year or the first of next year.  With that being said we will see him back in 2 months.  At that visit I would like a standing low AP pelvis to update his plain films.  By then he will have seen his primary care physician and potentially cardiology for final clearance.

## 2018-10-25 DIAGNOSIS — I2510 Atherosclerotic heart disease of native coronary artery without angina pectoris: Secondary | ICD-10-CM | POA: Diagnosis not present

## 2018-10-25 DIAGNOSIS — G4733 Obstructive sleep apnea (adult) (pediatric): Secondary | ICD-10-CM | POA: Diagnosis not present

## 2018-10-25 DIAGNOSIS — M549 Dorsalgia, unspecified: Secondary | ICD-10-CM | POA: Diagnosis not present

## 2018-10-25 DIAGNOSIS — I10 Essential (primary) hypertension: Secondary | ICD-10-CM | POA: Diagnosis not present

## 2018-10-25 DIAGNOSIS — R05 Cough: Secondary | ICD-10-CM | POA: Diagnosis not present

## 2018-10-31 ENCOUNTER — Ambulatory Visit
Admission: RE | Admit: 2018-10-31 | Discharge: 2018-10-31 | Disposition: A | Discharge status: Home / Self Care | Payer: Medicare Other | Source: Ambulatory Visit | Attending: Internal Medicine | Admitting: Internal Medicine

## 2018-10-31 ENCOUNTER — Other Ambulatory Visit: Payer: Self-pay | Admitting: Internal Medicine

## 2018-10-31 DIAGNOSIS — R05 Cough: Secondary | ICD-10-CM | POA: Diagnosis not present

## 2018-10-31 DIAGNOSIS — M549 Dorsalgia, unspecified: Secondary | ICD-10-CM

## 2018-10-31 DIAGNOSIS — R059 Cough, unspecified: Secondary | ICD-10-CM

## 2018-10-31 DIAGNOSIS — M47814 Spondylosis without myelopathy or radiculopathy, thoracic region: Secondary | ICD-10-CM | POA: Diagnosis not present

## 2018-11-10 DIAGNOSIS — Z23 Encounter for immunization: Secondary | ICD-10-CM | POA: Diagnosis not present

## 2018-11-15 DIAGNOSIS — H2640 Unspecified secondary cataract: Secondary | ICD-10-CM | POA: Diagnosis not present

## 2018-11-15 DIAGNOSIS — Z961 Presence of intraocular lens: Secondary | ICD-10-CM | POA: Diagnosis not present

## 2018-11-15 DIAGNOSIS — H401231 Low-tension glaucoma, bilateral, mild stage: Secondary | ICD-10-CM | POA: Diagnosis not present

## 2018-11-15 DIAGNOSIS — H43393 Other vitreous opacities, bilateral: Secondary | ICD-10-CM | POA: Diagnosis not present

## 2018-11-17 ENCOUNTER — Ambulatory Visit
Admission: RE | Admit: 2018-11-17 | Discharge: 2018-11-17 | Disposition: A | Discharge status: Home / Self Care | Payer: Medicare Other | Source: Ambulatory Visit | Attending: Cardiothoracic Surgery | Admitting: Cardiothoracic Surgery

## 2018-11-17 ENCOUNTER — Ambulatory Visit: Payer: Medicare Other | Admitting: Cardiothoracic Surgery

## 2018-11-17 DIAGNOSIS — I712 Thoracic aortic aneurysm, without rupture, unspecified: Secondary | ICD-10-CM

## 2018-11-17 MED ORDER — IOPAMIDOL (ISOVUE-370) INJECTION 76%
75.0000 mL | Freq: Once | INTRAVENOUS | Status: AC | PRN
  Administered 2018-11-17: 75 mL via INTRAVENOUS

## 2018-11-24 ENCOUNTER — Other Ambulatory Visit: Payer: Self-pay

## 2018-11-24 ENCOUNTER — Encounter: Payer: Self-pay | Admitting: Cardiothoracic Surgery

## 2018-11-24 ENCOUNTER — Ambulatory Visit (INDEPENDENT_AMBULATORY_CARE_PROVIDER_SITE_OTHER): Payer: Medicare Other | Admitting: Cardiothoracic Surgery

## 2018-11-24 VITALS — BP 134/86 | HR 58 | Temp 97.6°F | Resp 16 | Ht 68.5 in | Wt 215.5 lb

## 2018-11-24 DIAGNOSIS — I2510 Atherosclerotic heart disease of native coronary artery without angina pectoris: Secondary | ICD-10-CM

## 2018-11-24 DIAGNOSIS — Z09 Encounter for follow-up examination after completed treatment for conditions other than malignant neoplasm: Secondary | ICD-10-CM

## 2018-11-24 DIAGNOSIS — I712 Thoracic aortic aneurysm, without rupture, unspecified: Secondary | ICD-10-CM

## 2018-11-24 NOTE — Progress Notes (Signed)
[address]       [place] [address]             [phone]      Ricky C Keatts Elbert Medical Record #[id number] Date of Birth: 30-Mar-1950  Referring: Lorretta Harp, MD Primary Care: [name], MD Primary Cardiology: No primary care provider on file.   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 PROCEDURES:  Biologic Bentall with replacement of aortic valve with Edwards Lifesciences pericardial tissue valve, model 3300TFX 29 mm, serial #[id number], and replacement of the aortic root and ascending aorta with a 32 mm Gelweave Valsalva graft and reimplantation of the right and left coronary buttons. SURGEON: Grace Isaac MD  History of Present Illness:      Patient returns office today in a follow-up CT of the chest to evaluate his aortic arch and descending aorta following ascending aortic/aortic root replacement with biologic Bentall approximately 2 years ago.  The patient has several other chronic medical problems including platelet dysfunction.  He is waiting for hip replacement surgery in the near future.    Past Medical History:  Diagnosis Date  . Anemia   . Arthritis   . Asymptomatic bilateral carotid artery stenosis 08/2015   1-39%   . Blood dyscrasia    "trouble with my bloods clotting"  . Bruising    on the skin states due to platelets or sometimes low or high  . Cataracts, bilateral   . 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 Coronary artery disease   . Diverticulosis   . Enlarged aorta ([place])   . Enlarged prostate    slightly  . GERD (gastroesophageal reflux disease)    takes Pantoprazole daily as needed  . Glaucoma    uses eye drops daily  . Headache   . History of colon polyps    benign  .  History of kidney stones   . Hyperlipidemia    no on any meds  . Hypertension    takes Amlodipine and Atenolol daily  . Joint pain   . Joint swelling   . Nocturia   . OSA on CPAP   . Sleep apnea   . Vocal cord nodule    pt. states  it's a" growth on vocal cord"     Social History   Tobacco Use  Smoking Status Former Smoker  . Packs/day: 1.00  . Years: 25.00  . Pack years: 25.00  . Types: Cigarettes  Smokeless Tobacco Never Used    Social History   Substance and Sexual Activity  Alcohol Use No  . Alcohol/week: 0.0 standard drinks     No Known Allergies  Current Outpatient Medications  Medication Sig Dispense Refill  . amLODipine (NORVASC) 10 MG tablet Take 10 mg by mouth daily.    Marland Kitchen aspirin EC 81 MG EC tablet Take 1 tablet (81 mg total) by mouth daily.    Marland Kitchen atenolol (TENORMIN) 25 MG tablet Take 0.5 tablets (12.5 mg total) by mouth 2 (two) times daily. 30 tablet 1  . latanoprost (XALATAN) 0.005 % ophthalmic solution Place 1 drop into both eyes at bedtime.    . Multiple Vitamin (MULTIVITAMIN WITH MINERALS) TABS tablet Take 1 tablet by mouth daily.    . rosuvastatin (CRESTOR) 10 MG tablet Take 1 tablet (10  mg total) by mouth daily. 90 tablet 3  . tamsulosin (FLOMAX) 0.4 MG CAPS capsule Take 0.4 mg by mouth daily.     No current facility-administered medications for this visit.        Physical Exam: BP (!) 144/92 (BP Location: Right Arm, Patient Position: Sitting, Cuff Size: Large)   Pulse (!) 58   Temp 97.6 F (36.4 C)   Resp 16   Ht 5' 8.5" (1.74 m)   Wt 215 lb 8 oz (97.8 kg)   SpO2 97% Comment: RA  BMI 32.29 kg/m  General appearance: alert, cooperative and no distress Neck: no adenopathy, no carotid bruit, no JVD, supple, symmetrical, trachea midline and thyroid not enlarged, symmetric, no tenderness/mass/nodules Lymph nodes: Cervical, supraclavicular, and axillary nodes normal. Resp: clear to auscultation bilaterally Cardio: regular rate and rhythm,  S1, S2 normal, no murmur, click, rub or gallop GI: soft, non-tender; bowel sounds normal; no masses,  no organomegaly Extremities: extremities normal, atraumatic, no cyanosis or edema and Homans sign is negative, no sign of DVT Neurologic: Grossly normal Patient has contracture and right middle finger Comes in the office with a cane due to hip pain  Diagnostic Studies & Laboratory data:     Recent Radiology Findings:  Dg Chest 2 View  Result Date: 10/31/2018 CLINICAL DATA:  Unexplained cough. EXAM: CHEST - 2 VIEW COMPARISON:  02/25/2017 FINDINGS: Heart size and mediastinal contours are stable following aortic valve replacement with median sternotomy changes. Prominence of aortic contour is unchanged. Lungs are hyperinflated and clear.  No acute bone finding. IMPRESSION: Hyperinflation without acute cardiopulmonary disease. Electronically Signed   By: Zetta Bills M.D.   On: 10/31/2018 15:51   Dg Thoracic Spine W/swimmers  Result Date: 10/31/2018 CLINICAL DATA:  Back pain EXAM: THORACIC SPINE - 3 VIEWS COMPARISON:  None. FINDINGS: Signs of degenerative change near the thoracolumbar junction, better displayed on lateral view of chest x-ray. Vertebral body heights and disc spaces are maintained. IMPRESSION: No signs of acute fracture. Electronically Signed   By: Zetta Bills M.D.   On: 10/31/2018 15:59   Ct Angio Chest Aorta W/cm &/or Wo/cm  Result Date: 11/17/2018 CLINICAL DATA:  Thoracic aortic aneurysm. EXAM: CT ANGIOGRAPHY CHEST WITH CONTRAST TECHNIQUE: Multidetector CT imaging of the chest was performed using the standard protocol during bolus administration of intravenous contrast. Multiplanar CT image reconstructions and MIPs were obtained to evaluate the vascular anatomy. CONTRAST:  70mL ISOVUE-370 IOPAMIDOL (ISOVUE-370) INJECTION 76% COMPARISON:  May 04, 2017. FINDINGS: Cardiovascular: Status post aortic valve replacement and surgical repair of ascending thoracic aorta. Great vessels  are widely patent without significant stenosis. Transverse aortic arch measures 4.3 cm in diameter which is not significantly changed compared to prior exam. Stable mild focal outpouching is seen involving the proximal descending thoracic aorta is noted. Coronary artery calcifications are noted. Normal cardiac size. No pericardial effusion is noted. Aortic root measures 4.2 cm in diameter which is unchanged. Mediastinum/Nodes: No enlarged mediastinal, hilar, or axillary lymph nodes. Thyroid gland, trachea, and esophagus demonstrate no significant findings. Lungs/Pleura: Lungs are clear. No pleural effusion or pneumothorax. Upper Abdomen: No acute abnormality. Musculoskeletal: No chest wall abnormality. No acute or significant osseous findings. Review of the MIP images confirms the above findings. IMPRESSION: Status post aortic valve replacement surgical Pera of ascending thoracic aorta. 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 Stable mild focal outpouching is seen involving the proximal descending thoracic aorta compared to prior exam. Coronary artery calcifications are  noted. Electronically Signed   By: Marijo Conception M.D.   On: 11/17/2018 13:57   I have independently reviewed the above radiology studies  and reviewed the findings with the patient.    Recent Lab Findings: Lab Results  Component Value Date   WBC 6.5 08/17/2018   HGB 13.1 08/17/2018   HCT 38.6 08/17/2018   PLT 89 (LL) 08/17/2018   GLUCOSE 84 08/17/2018   CHOL 164 08/17/2018   TRIG 272 (H) 08/17/2018   HDL 35 (L) 08/17/2018   LDLCALC 75 08/17/2018   ALT 15 08/17/2018   AST 25 08/17/2018   NA 139 08/17/2018   K 3.7 08/17/2018   CL 98 08/17/2018   CREATININE 0.95 08/17/2018   BUN 14 08/17/2018   CO2 24 08/17/2018   TSH 3.190 08/17/2018   INR 1.15 10/30/2016   HGBA1C 5.3 09/11/2016   Result status: Final result                           *[facility]*                         [address], [place] [address]                            [phone]  ------------------------------------------------------------------- Transthoracic Echocardiography  Patient:    Krista, Som MR #:       [id number] Study Date: 10/09/2016 Gender:     M Age:        59 Height:     175.3 cm Weight:     90.1 kg BSA:        2.12 m^2 Pt. Status: Room:   SONOGRAPHER  Diamond Nickel  PERFORMING   [place]  Joanna Hews [phone]  [place] [phone]  [place], MD  cc:  ------------------------------------------------------------------- LV EF: 60% -   65%  ------------------------------------------------------------------- Indications:      I31.3 Pericardial effusion.  ------------------------------------------------------------------- History:   PMH:  Aortic valve replacement. Aortic root dilatation. Sleep apnea.  Syncope.  Coronary artery disease.  Risk factors: Hypertension. Dyslipidemia.  ------------------------------------------------------------------- Study Conclusions  - Left ventricle: The cavity size was normal. Wall thickness was   increased in a pattern of mild LVH. Systolic function was normal.   The estimated ejection fraction was in the range of 60% to 65%. - Aortic valve: AV prosthesis appears to open well Peak and mean   gradients through the valve are 9 and 5 mm Hg respectively. - Mitral valve: Calcified annulus. Mildly thickened leaflets .   There was mild regurgitation. - Pulmonary arteries: PA peak pressure: 37 mm Hg (S). - Pericardium, extracardiac: A trivial pericardial effusion was   identified.  ------------------------------------------------------------------- Study data:  Comparison was made to the study of 09/16/2016.  Study status:  Routine.  Procedure:  Transthoracic echocardiography. Image quality was adequate.  Study completion:  There were  no complications.          Transthoracic echocardiography.  M-mode, complete 2D, spectral Doppler, and color Doppler.  Birthdate: Patient birthdate: 1950-03-04.  Age:  Patient is 68 yr old.  Sex: Gender: male.    BMI: 29.3  kg/m^2.  Blood pressure:     114/81 Patient status:  Outpatient.  Study date:  Study date: 10/09/2016. Study time: 01:03 PM.  Location:  [facility]  -------------------------------------------------------------------  ------------------------------------------------------------------- Left ventricle:  Difficult acoustic windows LVEF is normal at approximately 60 % with mossible mild distal inferior hypokeinesis. The cavity size was normal. Wall thickness was increased in a pattern of mild LVH. Systolic function was normal. The estimated ejection fraction was in the range of 60% to 65%.  ------------------------------------------------------------------- Aortic valve:  AV prosthesis appears to open well Peak and mean gradients through the valve are 9 and 5 mm Hg respectively.  Mildly thickened leaflets.  Doppler:  There was no regurgitation.    VTI ratio of LVOT to aortic valve: 0.47. Peak velocity ratio of LVOT to aortic valve: 0.47. Mean velocity ratio of LVOT to aortic valve: 0.46.    Mean gradient (S): 5 mm Hg. Peak gradient (S): 9 mm Hg.   ------------------------------------------------------------------- Aorta:  Aortic root dilated at 43 mm Ascending aorta is dilated at 45 mm.  ------------------------------------------------------------------- Mitral valve:   Calcified annulus. Mildly thickened leaflets . Leaflet separation was normal.  Doppler:  Transvalvular velocity was within the normal range. There was no evidence for stenosis. There was mild regurgitation.    Peak gradient (D): 2 mm Hg.  ------------------------------------------------------------------- Left atrium:  The atrium was normal in size.   ------------------------------------------------------------------- Right ventricle:  The cavity size was normal. Wall thickness was normal. Systolic function was normal.  ------------------------------------------------------------------- Pulmonic valve:    Structurally normal valve.   Cusp separation was normal.  Doppler:  Transvalvular velocity was within the normal range. There was no regurgitation.  ------------------------------------------------------------------- Tricuspid valve:   Structurally normal valve.   Leaflet separation was normal.  Doppler:  Transvalvular velocity was within the normal range. There was trivial regurgitation.  ------------------------------------------------------------------- Right atrium:  The atrium was normal in size.  ------------------------------------------------------------------- Pericardium:  A trivial pericardial effusion was identified.  ------------------------------------------------------------------- Systemic veins: Inferior vena cava: The vessel was normal in size. The respirophasic diameter changes were in the normal range (= 50%), consistent with normal central venous pressure.  ------------------------------------------------------------------- Post procedure conclusions Ascending Aorta:  - Aortic root dilated at 43 mm Ascending aorta is dilated at 45 mm.  ------------------------------------------------------------------- Measurements   Left ventricle                         Value        Reference  LV ID, ED, PLAX chordal                46.1  mm     43 - 52  LV ID, ES, PLAX chordal                37.2  mm     23 - 38  LV fx shortening, PLAX chordal (L)     19    %      >=29  LV PW thickness, ED                    14.7  mm     ---------  IVS/LV PW ratio, ED                    0.86         <=1.3  LV e&', lateral  9.87  cm/s   ---------  LV E/e&', lateral                       7.95          ---------  LV e&', medial                          10.6  cm/s   ---------  LV E/e&', medial                        7.41         ---------  LV e&', average                         10.24 cm/s   ---------  LV E/e&', average                       7.67         ---------    Ventricular septum                     Value        Reference  IVS thickness, ED                      12.7  mm     ---------    LVOT                                   Value        Reference  LVOT peak velocity, S                  70.6  cm/s   ---------  LVOT mean velocity, S                  49.9  cm/s   ---------  LVOT VTI, S                            13.5  cm     ---------  LVOT peak gradient, S                  2     mm Hg  ---------    Aortic valve                           Value        Reference  Aortic valve peak velocity, S          150   cm/s   ---------  Aortic valve mean velocity, S          108   cm/s   ---------  Aortic valve VTI, S                    29    cm     ---------  Aortic mean gradient, S                5     mm Hg  ---------  Aortic peak gradient, S                9     mm Hg  ---------  VTI ratio, LVOT/AV                     0.47         ---------  Velocity ratio, peak, LVOT/AV          0.47         ---------  Velocity ratio, mean, LVOT/AV          0.46         ---------    Left atrium                            Value        Reference  LA ID, A-P, ES                         33    mm     ---------  LA ID/bsa, A-P                         1.56  cm/m^2 <=2.2  LA volume, ES, 1-p A4C                 38    ml     ---------  LA volume/bsa, ES, 1-p A4C             18    ml/m^2 ---------    Mitral valve                           Value        Reference  Mitral E-wave peak velocity            78.5  cm/s   ---------  Mitral A-wave peak velocity            51.8  cm/s   ---------  Mitral deceleration time       (L)     130   ms     150 - 230  Mitral peak gradient, D                2     mm Hg  ---------   Mitral E/A ratio, peak                 1.5          ---------    Pulmonary arteries                     Value        Reference  PA pressure, S, DP             (H)     37    mm Hg  <=30    Tricuspid valve                        Value        Reference  Tricuspid regurg peak velocity         270   cm/s   ---------  Tricuspid peak RV-RA gradient          29    mm Hg  ---------    Systemic veins                         Value  Reference  Estimated CVP                          8     mm Hg  ---------    Right ventricle                        Value        Reference  RV pressure, S, DP             (H)     37    mm Hg  <=30  Legend: (L)  and  (H)  mark values outside specified reference range.  ------------------------------------------------------------------- Prepared and Electronically Authenticated by  Dorris Carnes, M.D. 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     2/ Stable mild focal outpouching  involving the proximal descending thoracic aorta .  Plan on follow-up CTA of the chest in 1 year Again reviewed with the patient the need for bacterial endocarditis prophylaxis with invasive procedures including dental work   Grace Isaac MD      [address] [place], [address] Office [phone]   [place]  11/24/2018 3:13 PM

## 2018-12-12 ENCOUNTER — Ambulatory Visit: Payer: Medicare Other | Admitting: Orthopaedic Surgery

## 2018-12-14 ENCOUNTER — Other Ambulatory Visit: Payer: Self-pay

## 2018-12-14 ENCOUNTER — Ambulatory Visit (INDEPENDENT_AMBULATORY_CARE_PROVIDER_SITE_OTHER): Payer: Medicare Other

## 2018-12-14 ENCOUNTER — Ambulatory Visit (INDEPENDENT_AMBULATORY_CARE_PROVIDER_SITE_OTHER): Payer: Medicare Other | Admitting: Orthopaedic Surgery

## 2018-12-14 ENCOUNTER — Encounter: Payer: Self-pay | Admitting: Orthopaedic Surgery

## 2018-12-14 VITALS — Ht 69.0 in | Wt 214.0 lb

## 2018-12-14 DIAGNOSIS — I2510 Atherosclerotic heart disease of native coronary artery without angina pectoris: Secondary | ICD-10-CM | POA: Diagnosis not present

## 2018-12-14 DIAGNOSIS — M1611 Unilateral primary osteoarthritis, right hip: Secondary | ICD-10-CM | POA: Diagnosis not present

## 2018-12-14 NOTE — Progress Notes (Signed)
Office Visit Note   Patient: Lonnie Hansen           Date of Birth: 06/07/1950           MRN: [id number] Visit Date: 12/14/2018              Requested by: [name], MD [place],  [place] [address] PCP: [name], MD   Assessment & Plan: Visit Diagnoses:  1. Unilateral primary osteoarthritis, right hip     Plan: At this point we are ready to schedule him for a right total hip arthroplasty.  I showed him a hip model explained in detail what the surgery involves.  We talked about the risk and benefits of surgery.  We talked about his interoperative and postoperative course.  All question concerns were answered and addressed.  Once we have clearance from his medical physician in terms of his platelet level and we can schedule the surgery.  He would need to have his platelets above at least 60,000 to consider hip replacement.  Ideally it would need to be over 80-90,000.  Follow-Up Instructions: No follow-ups on file.  The patient has our surgery scheduler's card and will call once he is ready to schedule his right hip replacement surgery.  Orders:  Orders Placed This Encounter  Procedures   XR Pelvis 1-2 Views   No orders of the defined types were placed in this encounter.     Procedures: No procedures performed   Clinical Data: No additional findings.   Subjective: Chief Complaint  Patient presents with   Left Foot - Pain, Follow-up   Right Hip - Pain, Follow-up  The patient is well-known to me.  He has severe end-stage arthritis of his right hip.  He ambulates with a cane.  His right hip pain is daily and it is severe.  It is definitely affecting his mobility, his quality of life and his activities daily living.  Has been dealing with it for over 5 years now.  He does have a history of low platelets.  He also has a history of valve replacement surgery.  He is not on any blood thinning medications.  He does see his primary care physician at the  end of this month for having his platelets checked again.  He would then like to consider hip replacement surgery in the near future.  HPI  Review of Systems   Objective: Vital Signs: Ht 5\' 9"  (1.753 m)    Wt 214 lb (97.1 kg)    BMI 31.60 kg/m   Physical Exam Is alert and orient x3 and in no acute distress Ortho Exam Examination shows that he has significant stiffness and pain with internal and external rotation of the right hip and even blocks to rotation.  His leg length is shorter on the right than the left.  He has severe pain when even move his hip around on that right side.  His left hip exam moves smoothly. Specialty Comments:  No specialty comments available.  Imaging: Xr Pelvis 1-2 Views  Result Date: 12/14/2018 An AP pelvis and lateral of the right hip show severe end-stage arthritis.  There is complete loss of the joint space.  There is shortening of the extremity as well due to the profound arthritis.  There is superior migration of the femoral head as well.  There is significant cystic changes and periarticular osteophytes.    PMFS History: Patient Active Problem List   Diagnosis Date Noted  Unilateral primary osteoarthritis, 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   Obstructive sleep apnea treated with continuous positive airway pressure (CPAP) 03/04/2017   Vocal cord nodule    Sleep apnea    Nocturia    Joint swelling    Joint pain    Hypertension    History of kidney stones    History of colon polyps    Headache    Glaucoma    GERD (gastroesophageal reflux disease)    Enlarged prostate    Enlarged aorta (HCC)    Diverticulosis    Coronary artery disease    Cataracts, bilateral    Bruising    Blood dyscrasia    Arthritis    Anemia    Hydropneumothorax 10/29/2016   S/P AVR 09/15/2016   Coronary artery disease involving native coronary artery of native heart without angina pectoris  04/29/2016   Aortic insufficiency 04/01/2016   Thoracic aortic aneurysm ([place]) 09/18/2015   HTN (hypertension) 09/18/2015   Hyperlipidemia 09/18/2015   Asymptomatic bilateral carotid artery stenosis 08/27/2015   Past Medical History:  Diagnosis Date   Anemia    Arthritis    Asymptomatic bilateral carotid artery stenosis 08/2015   1-39%    Blood dyscrasia    "trouble with my bloods clotting"   Bruising    on the skin states due to platelets or sometimes low or high   Cataracts, bilateral    Chronic ITP (idiopathic thrombocytopenia) ([place]) 03/31/2018   Coronary artery disease    Diverticulosis    Enlarged aorta (HCC)    Enlarged prostate    slightly   GERD (gastroesophageal reflux disease)    takes Pantoprazole daily as needed   Glaucoma    uses eye drops daily   Headache    History of colon polyps    benign   History of kidney stones    Hyperlipidemia    no on any meds   Hypertension    takes Amlodipine and Atenolol daily   Joint pain    Joint swelling    Nocturia    OSA on CPAP    Sleep apnea    Vocal cord nodule    pt. states  it's a" growth on vocal cord"    Family History  Problem Relation Age of Onset   Heart disease Father    Heart attack Father    Thyroid disease Sister     Past Surgical History:  Procedure Laterality Date   AORTIC ARCH ANGIOGRAPHY N/A 04/16/2016   Procedure: Aortic Arch Angiography;  Surgeon: [name] Martinique, MD;  Location: [facility];  Service: Cardiovascular;  Laterality: N/A;   AORTIC VALVE REPLACEMENT N/A 09/15/2016   Procedure: AORTIC VALVE REPLACEMENT (AVR);  Surgeon: Grace Isaac, MD;  Location: [facility];  Service: Open Heart Surgery;  Laterality: N/A;  Using 63mm Edwards Perimount Magna Ease Aortic Bioprosthesis Valve   ASCENDING AORTIC ROOT REPLACEMENT N/A 09/15/2016   Procedure: ASCENDING AORTIC ROOT REPLACEMENT;  Surgeon: Grace Isaac, MD;  Location: [facility];  Service: Open Heart  Surgery;  Laterality: N/A;  Using 56mm Gelweave Valsalva Graft   COLONOSCOPY     COLONOSCOPY WITH ESOPHAGOGASTRODUODENOSCOPY (EGD)     IR THORACENTESIS ASP PLEURAL SPACE W/IMG GUIDE  10/23/2016   MICROLARYNGOSCOPY Right 09/20/2017   Procedure: MICROLARYNGOSCOPY WITH  EXCISION OF VOCAL CORD LESION;  Surgeon: Leta Baptist, MD;  Location: [facility];  Service: ENT;  Laterality: Right;   MULTIPLE EXTRACTIONS WITH  ALVEOLOPLASTY N/A 06/10/2016   Procedure: Extraction of tooth #'s 2,8,13,15, and 29  with alveoloplasty, maxillary right and left buccal exostoses reductions, and gross debridement of remaining teeth.;  Surgeon: Lenn Cal, DDS;  Location: [facility];  Service: Oral Surgery;  Laterality: N/A;   RIGHT/LEFT HEART CATH AND CORONARY ANGIOGRAPHY N/A 04/16/2016   Procedure: Right/Left Heart Cath and Coronary Angiography;  Surgeon: [name] Martinique, MD;  Location: [facility];  Service: Cardiovascular;  Laterality: N/A;   TEE WITHOUT CARDIOVERSION N/A 09/15/2016   Procedure: TRANSESOPHAGEAL ECHOCARDIOGRAM (TEE);  Surgeon: Grace Isaac, MD;  Location: [facility];  Service: Open Heart Surgery;  Laterality: N/A;   Social History   Occupational History   Occupation: Musician  Tobacco Use   Smoking status: Former Smoker    Packs/day: 1.00    Years: 25.00    Pack years: 25.00    Types: Cigarettes   Smokeless tobacco: Never Used  Substance and Sexual Activity   Alcohol use: No    Alcohol/week: 0.0 standard drinks   Drug use: No   Sexual activity: Not on file

## 2018-12-26 DIAGNOSIS — E7849 Other hyperlipidemia: Secondary | ICD-10-CM | POA: Diagnosis not present

## 2018-12-26 DIAGNOSIS — Z125 Encounter for screening for malignant neoplasm of prostate: Secondary | ICD-10-CM | POA: Diagnosis not present

## 2018-12-29 DIAGNOSIS — R82998 Other abnormal findings in urine: Secondary | ICD-10-CM | POA: Diagnosis not present

## 2019-01-02 DIAGNOSIS — R3121 Asymptomatic microscopic hematuria: Secondary | ICD-10-CM | POA: Diagnosis not present

## 2019-01-02 DIAGNOSIS — Z1339 Encounter for screening examination for other mental health and behavioral disorders: Secondary | ICD-10-CM | POA: Diagnosis not present

## 2019-01-02 DIAGNOSIS — D693 Immune thrombocytopenic purpura: Secondary | ICD-10-CM | POA: Diagnosis not present

## 2019-01-02 DIAGNOSIS — E785 Hyperlipidemia, unspecified: Secondary | ICD-10-CM | POA: Diagnosis not present

## 2019-01-02 DIAGNOSIS — I10 Essential (primary) hypertension: Secondary | ICD-10-CM | POA: Diagnosis not present

## 2019-01-02 DIAGNOSIS — G4733 Obstructive sleep apnea (adult) (pediatric): Secondary | ICD-10-CM | POA: Diagnosis not present

## 2019-01-02 DIAGNOSIS — M79644 Pain in right finger(s): Secondary | ICD-10-CM | POA: Diagnosis not present

## 2019-01-02 DIAGNOSIS — M545 Low back pain: Secondary | ICD-10-CM | POA: Diagnosis not present

## 2019-01-02 DIAGNOSIS — H409 Unspecified glaucoma: Secondary | ICD-10-CM | POA: Diagnosis not present

## 2019-01-02 DIAGNOSIS — Z0000 Encounter for general adult medical examination without abnormal findings: Secondary | ICD-10-CM | POA: Diagnosis not present

## 2019-01-02 DIAGNOSIS — M25551 Pain in right hip: Secondary | ICD-10-CM | POA: Diagnosis not present

## 2019-01-09 ENCOUNTER — Other Ambulatory Visit: Payer: Self-pay | Admitting: Cardiology

## 2019-01-10 ENCOUNTER — Other Ambulatory Visit: Payer: Self-pay | Admitting: Orthopedic Surgery

## 2019-01-10 DIAGNOSIS — M79644 Pain in right finger(s): Secondary | ICD-10-CM

## 2019-01-10 NOTE — Telephone Encounter (Signed)
Rx request sent to pharmacy.  

## 2019-01-13 ENCOUNTER — Ambulatory Visit (INDEPENDENT_AMBULATORY_CARE_PROVIDER_SITE_OTHER): Payer: Medicare Other | Admitting: Cardiology

## 2019-01-13 ENCOUNTER — Other Ambulatory Visit: Payer: Self-pay

## 2019-01-13 ENCOUNTER — Encounter: Payer: Self-pay | Admitting: Cardiology

## 2019-01-13 ENCOUNTER — Encounter

## 2019-01-13 VITALS — BP 126/70 | HR 62 | Ht 69.0 in | Wt 215.0 lb

## 2019-01-13 DIAGNOSIS — I2510 Atherosclerotic heart disease of native coronary artery without angina pectoris: Secondary | ICD-10-CM | POA: Diagnosis not present

## 2019-01-13 DIAGNOSIS — E785 Hyperlipidemia, unspecified: Secondary | ICD-10-CM

## 2019-01-13 DIAGNOSIS — Z952 Presence of prosthetic heart valve: Secondary | ICD-10-CM | POA: Diagnosis not present

## 2019-01-13 DIAGNOSIS — Z8679 Personal history of other diseases of the circulatory system: Secondary | ICD-10-CM

## 2019-01-13 DIAGNOSIS — Z9889 Other specified postprocedural states: Secondary | ICD-10-CM

## 2019-01-13 DIAGNOSIS — I10 Essential (primary) hypertension: Secondary | ICD-10-CM | POA: Diagnosis not present

## 2019-01-13 NOTE — Progress Notes (Signed)
Cardiology Office Note    Date:  01/13/2019   ID:  Lonnie, Hansen [birth year], MRN [id number]  PCP:  [name], MD  Cardiologist:  Dr. Martinique   Chief Complaint  Patient presents with  . Coronary Artery Disease    History of Present Illness:  Lonnie Hansen is a 68 y.o. 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 In 2017 chest x-ray showed aortic root enlargement. He underwent CT that showed dilatation of proximal aorta. Descending aorta was normal. Echocardiogram obtained on 09/20/2015 showed EF 60-65%, moderate to severe AI, grade 1 DD. He was evaluated by Dr. Servando Snare. More recently, CT scan showed aortic root had enlarged to 5.2 cm. Given the size of aortic root and the degree of AI, it was recommended he undergo aortic root grafting and AVR. He underwent preoperative evaluation with cardiac catheterization on 04/14/2016 which showed moderate to severe AI, EF 45%, 80% stenosis in a nondominant RCA, 50% distal LAD. He eventually underwent the planned procedure with ascending aortic root replacement using 32 mm Gelweave Valsalva Graft and AVR using 29 mm Edwards Perimount Magna Ease aortic bioprosthesis valve on 09/15/2016 by Dr. Servando Snare.  Postoperatively, he did have some volume overload that responded well to diuretic. He also had postoperative thrombocytopenia as well. He was placed on oral amiodarone for postoperative atrial fibrillation.  When seen in follow up chest x-ray showed recurrent right pleural effusion.Echo on 10/09/2016 showed only a trivial amount of pericardial effusion, EF 60-65%. He underwent right thoracentesis again on 10/23/2016 with the removal 2 L of dark blood. Post procedure, he developed a right pneumothorax related to incomplete reexpansion of the right middle and lower lobe of the lung. He was admitted on 10/30/2016 for pigtail drain placement, last chest x-ray obtained on 11/04/2016  showed improvement of right pleural effusion and resolution of right pneumothorax. Followed by Dr. Servando Snare - Repeat CT chest in October 2020 looked good.   On follow up today he is doing OK. He is going to the gym daily. He still is up 18 lbs since last year. No dyspnea or chest pain. No edema. Will eventually needs hip replacement.   Past Medical History:  Diagnosis Date  . Anemia   . Arthritis   . Asymptomatic bilateral carotid artery stenosis 08/2015   1-39%   . Blood dyscrasia    "trouble with my bloods clotting"  . Bruising    on the skin states due to platelets or sometimes low or high  . Cataracts, bilateral   . 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 Coronary artery disease   . Diverticulosis   . Enlarged aorta ([place])   . Enlarged prostate    slightly  . GERD (gastroesophageal reflux disease)    takes Pantoprazole daily as needed  . Glaucoma    uses eye drops daily  . Headache   . History of colon polyps    benign  . History of kidney stones   . Hyperlipidemia    no on any meds  . Hypertension    takes Amlodipine and Atenolol daily  . Joint pain   . Joint swelling   . Nocturia   . OSA on CPAP   . Sleep apnea   . Vocal cord nodule    pt. states  it's a" growth on vocal cord"    Past Surgical History:  Procedure Laterality Date  .  AORTIC ARCH ANGIOGRAPHY N/A 04/16/2016   Procedure: Aortic Arch Angiography;  Surgeon: Aricka Goldberger M Martinique, MD;  Location: [facility];  Service: Cardiovascular;  Laterality: N/A;  . AORTIC VALVE REPLACEMENT N/A 09/15/2016   Procedure: AORTIC VALVE REPLACEMENT (AVR);  Surgeon: Grace Isaac, MD;  Location: [facility];  Service: Open Heart Surgery;  Laterality: N/A;  Using 102mm Edwards Perimount Magna Ease Aortic Bioprosthesis Valve  . ASCENDING AORTIC ROOT REPLACEMENT N/A 09/15/2016   Procedure: ASCENDING AORTIC ROOT REPLACEMENT;  Surgeon: Grace Isaac, MD;  Location: [facility];  Service: Open Heart Surgery;   Laterality: N/A;  Using 3mm Gelweave Valsalva Graft  . COLONOSCOPY    . COLONOSCOPY WITH ESOPHAGOGASTRODUODENOSCOPY (EGD)    . IR THORACENTESIS ASP PLEURAL SPACE W/IMG GUIDE  10/23/2016  . MICROLARYNGOSCOPY Right 09/20/2017   Procedure: MICROLARYNGOSCOPY WITH  EXCISION OF VOCAL CORD LESION;  Surgeon: Leta Baptist, MD;  Location: [facility];  Service: ENT;  Laterality: Right;  . MULTIPLE EXTRACTIONS WITH ALVEOLOPLASTY N/A 06/10/2016   Procedure: Extraction of tooth #'s 2,8,13,15, and 29  with alveoloplasty, maxillary right and left buccal exostoses reductions, and gross debridement of remaining teeth.;  Surgeon: Lenn Cal, DDS;  Location: [facility];  Service: Oral Surgery;  Laterality: N/A;  . RIGHT/LEFT HEART CATH AND CORONARY ANGIOGRAPHY N/A 04/16/2016   Procedure: Right/Left Heart Cath and Coronary Angiography;  Surgeon: Fawn Desrocher M Martinique, MD;  Location: [facility];  Service: Cardiovascular;  Laterality: N/A;  . TEE WITHOUT CARDIOVERSION N/A 09/15/2016   Procedure: TRANSESOPHAGEAL ECHOCARDIOGRAM (TEE);  Surgeon: Grace Isaac, MD;  Location: [facility];  Service: Open Heart Surgery;  Laterality: N/A;    Current Medications: Outpatient Medications Prior to Visit  Medication Sig Dispense Refill  . amLODipine (NORVASC) 10 MG tablet Take 10 mg by mouth daily.    Marland Kitchen aspirin EC 81 MG EC tablet Take 1 tablet (81 mg total) by mouth daily.    Marland Kitchen atenolol (TENORMIN) 25 MG tablet Take 0.5 tablets (12.5 mg total) by mouth 2 (two) times daily. 30 tablet 1  . latanoprost (XALATAN) 0.005 % ophthalmic solution Place 1 drop into both eyes at bedtime.    . Multiple Vitamin (MULTIVITAMIN WITH MINERALS) TABS tablet Take 1 tablet by mouth daily.    . rosuvastatin (CRESTOR) 10 MG tablet Take 1 tablet by mouth once daily 90 tablet 0  . tamsulosin (FLOMAX) 0.4 MG CAPS capsule Take 0.4 mg by mouth daily.     No facility-administered medications prior to visit.     Allergies:   Patient has no  known allergies.   Social History   Socioeconomic History  . Marital status: Married    Spouse name: Not on file  . Number of children: 1  . Years of education: Not on file  . Highest education level: Not on file  Occupational History  . Occupation: Musician  Tobacco Use  . Smoking status: Former Smoker    Packs/day: 1.00    Years: 25.00    Pack years: 25.00    Types: Cigarettes  . Smokeless tobacco: Never Used  Substance and Sexual Activity  . Alcohol use: No    Alcohol/week: 0.0 standard drinks  . Drug use: No  . Sexual activity: Not on file  Other Topics Concern  . 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   Social Determinants of Health   Financial  Resource Strain:   . Difficulty of Paying Living Expenses: Not on file  Food Insecurity:   . Worried About Charity fundraiser in the Last Year: Not on file  . Ran Out of Food in the Last Year: Not on file  Transportation Needs:   . Lack of Transportation (Medical): Not on file  . Lack of Transportation (Non-Medical): Not on file  Physical Activity:   . Days of Exercise per Week: Not on file  . Minutes of Exercise per Session: Not on file  Stress:   . Feeling of Stress : Not on file  Social Connections:   . Frequency of Communication with Friends and Family: Not on file  . Frequency of Social Gatherings with Friends and Family: Not on file  . Attends Religious Services: Not on file  . Active Member of Clubs or Organizations: Not on file  . Attends Archivist Meetings: Not on file  . Marital Status: Not on file     Family History:  The patient's family history includes Heart attack in his father; Heart disease in his father; Thyroid disease in his sister.   ROS:   Please see the history of present illness.    ROS All other systems reviewed and are negative.   PHYSICAL EXAM:   VS:  BP 126/70   Pulse 62   Ht 5\' 9"  (1.753 m)   Wt 215 lb (97.5 kg)    SpO2 97%   BMI 31.75 kg/m    GENERAL:  Well appearing, obese BM in NAD. Walks with a cane. HEENT:  PERRL, EOMI, sclera are clear. Oropharynx is clear. NECK:  No jugular venous distention, carotid upstroke brisk and symmetric, no bruits, no thyromegaly or adenopathy LUNGS:  Clear to auscultation bilaterally CHEST:  Unremarkable HEART:  RRR,  PMI not displaced or sustained,S1 and S2 within normal limits, no S3, no S4: no clicks, no rubs, no murmurs ABD:  Soft, nontender. BS +, no masses or bruits. No hepatomegaly, no splenomegaly EXT:  2 + pulses throughout, no edema, no cyanosis no clubbing SKIN:  Warm and dry.  No rashes NEURO:  Alert and oriented x 3. Cranial nerves II through XII intact. PSYCH:  Cognitively intact      Wt Readings from Last 3 Encounters:  01/13/19 215 lb (97.5 kg)  12/14/18 214 lb (97.1 kg)  11/24/18 215 lb 8 oz (97.8 kg)      Studies/Labs Reviewed:   EKG:  EKG is  Not ordered today.     Recent Labs: 08/17/2018: ALT 15; BUN 14; Creatinine, Ser 0.95; Hemoglobin 13.1; Platelets 89; Potassium 3.7; Sodium 139; TSH 3.190   Lipid Panel    Component Value Date/Time   CHOL 164 08/17/2018 1202   TRIG 272 (H) 08/17/2018 1202   HDL 35 (L) 08/17/2018 1202   CHOLHDL 4.7 08/17/2018 1202   CHOLHDL 3.5 04/28/2010 0703   VLDL 14 04/28/2010 0703   LDLCALC 75 08/17/2018 1202    Additional studies/ records that were reviewed today include:   Echo 09/20/2015 LV EF: 60% - 65%  Study Conclusions  - Left ventricle: The cavity size was normal. There was moderate focal basal hypertrophy of the septum. Systolic function was normal. The estimated ejection fraction was in the range of 60% to 65%. Wall motion was normal; there were no regional wall motion abnormalities. Doppler parameters are consistent with abnormal left ventricular relaxation (grade 1 diastolic dysfunction). - Aortic valve: There was moderate to severe regurgitation. - Aorta:  Aortic  root dimension: 43 mm (ED). - Ascending aorta: The ascending aorta was mildly dilated. - Right ventricle: The cavity size was mildly dilated. Wall thickness was normal.    Cath 04/16/2016 Conclusion     Mid RCA lesion, 80 %stenosed.  Ost LAD to Prox LAD lesion, 25 %stenosed.  Dist LAD lesion, 50 %stenosed.  There is mild left ventricular systolic dysfunction.  LV end diastolic pressure is normal.  The left ventricular ejection fraction is 45-50% by visual estimate.  There is no mitral valve regurgitation.  There is no aortic valve stenosis.  LV end diastolic pressure is normal.  1. 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 Mild LV dysfunction. EF 45%. 3. Ascending thoracic aortic aneurysm. 4. 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 5. Normal Right heart And LV filling pressures 6. Normal cardiac output.   Plan: Aortic root grafting and AVR.      Echo 09/16/2016 LV EF: 55% - 60%  Study Conclusions  - Left ventricle: The cavity size was normal. Wall thickness was increased in a pattern of severe LVH. Systolic function was normal. The estimated ejection fraction was in the range of 55% to 60%. - Aortic valve: Valve area (VTI): 4.29 cm^2. Valve area (Vmax): 4.15 cm^2. Valve area (Vmean): 3.88 cm^2. - Mitral valve: Severely calcified annulus. Valve area by continuity equation (using LVOT flow): 3.35 cm^2. - Pericardium, extracardiac: Localized moderate pericardial effusion posterior to the LA.    Limited Echo 10/09/2016 LV EF: 60% -   65%  Study Conclusions  - Left ventricle: The cavity size was normal. Wall thickness was   increased in a pattern of mild LVH. Systolic function was normal.   The estimated ejection fraction was in the range of 60% to 65%. - Aortic valve: AV prosthesis appears to open well Peak and mean   gradients through the valve are 9 and 5 mm Hg respectively. - Mitral valve: Calcified annulus.  Mildly thickened leaflets .   There was mild regurgitation. - Pulmonary arteries: PA peak pressure: 37 mm Hg (S). - Pericardium, extracardiac: A trivial pericardial effusion was   identified.   R thoracentesis 10/23/2016 Post CXR: IMPRESSION: Resolution of right-sided pleural effusion. Right pneumothorax is noted related to incomplete re-expansion of the right middle and lower lobes. This does not represent a true pneumothorax. Continued follow-up is recommended.  CT ANGIOGRAPHY CHEST WITH CONTRAST  TECHNIQUE: Multidetector CT imaging of the chest was performed using the standard protocol during bolus administration of intravenous contrast. Multiplanar CT image reconstructions and MIPs were obtained to evaluate the vascular anatomy.  CONTRAST:  13mL ISOVUE-370 IOPAMIDOL (ISOVUE-370) INJECTION 76%  COMPARISON:  03/26/2016, 01/30/2014  FINDINGS: Cardiovascular: There are changes consistent with aortic valve replacement. Coronary calcifications are noted. Aortic root repair is noted as well. At the level of the sinus of Valsalva aorta measures approximately 4.2 cm. The ascending component is widely patent consistent with the repair. The thoracic arch and descending thoracic aorta show atherosclerotic change. A focal outpouching associated with some soft atherosclerotic plaque is noted best seen on image number 96 of series 16 and image number 47 of series 7. This is slightly more prominent than on the previous preoperative exam. No dissection is noted. No significant cardiac enlargement is seen.  Mediastinum/Nodes: Thoracic inlet is within normal limits. No significant hilar or mediastinal adenopathy is noted. The esophagus is within normal limits.  Lungs/Pleura: The lungs are well aerated bilaterally. No focal infiltrate or sizable effusion is seen. No  parenchymal nodules are noted.  Upper Abdomen: Visualized upper abdomen demonstrates a large right parapelvic  cyst as well as a large lamellated gallstone. Stable partially calcified partially cystic lesion in the left kidney is again seen similar to that noted in 2016.  Musculoskeletal: Mild degenerative changes of the thoracic spine are seen. No acute bony abnormality is noted. Prior median sternotomy is seen.  Review of the MIP images confirms the above findings.  IMPRESSION: Status post aortic valve replacement and ascending aortic repair without complicating factors.  Focal mild outpouching associated with some soft atherosclerotic plaque in the proximal descending thoracic aorta. This is slightly more prominent than that seen on the prior exam. Attention on follow-up examinations is recommended. No true dissection is seen.  Stable changes in the upper abdomen without complication.   Electronically Signed   By: Inez Catalina M.D.   On: 05/04/2017 13:49  ASSESSMENT:    No diagnosis found.   PLAN:  In order of problems listed above:  1. H/o Aortic root repair and AVR with a tissue valve.:   Repeat Echo in September 2018 showed good prosthetic valve function. Repeat CT in October showed mild dilation of the descending aorta that is stable.    2. Recurrent right pleural effusion- resolved.   3. CAD: asymptomatic.  80% mid nondominant RCA and a 50% distal LAD lesion on previous cardiac catheterization.  Continue medical therapy.   4. Hypertension: Blood pressure is under good control.  5. Hyperlipidemia: Cholesterol looks OK but triglycerides are high related to significant weight gain. Needs to focus on controlling weight.   6.   ITP followed by Dr [name].    Medication Adjustments/Labs and Tests Ordered: Current medicines are reviewed at length with the patient today.  Concerns regarding medicines are outlined above.  Medication changes, Labs and Tests ordered today are listed in the Patient Instructions below. There are no Patient Instructions on file for this  visit.   Signed, Keigo Whalley Martinique, MD  01/13/2019 11:44 AM    [facility] [address], [place], [place]  [address] Phone: [phone]; Fax: [phone]

## 2019-01-18 ENCOUNTER — Other Ambulatory Visit: Payer: Self-pay | Admitting: Orthopedic Surgery

## 2019-01-18 ENCOUNTER — Ambulatory Visit
Admission: RE | Admit: 2019-01-18 | Discharge: 2019-01-18 | Disposition: A | Discharge status: Home / Self Care | Payer: Medicare Other | Source: Ambulatory Visit | Attending: Orthopedic Surgery | Admitting: Orthopedic Surgery

## 2019-01-18 DIAGNOSIS — M79644 Pain in right finger(s): Secondary | ICD-10-CM

## 2019-02-01 DIAGNOSIS — R229 Localized swelling, mass and lump, unspecified: Secondary | ICD-10-CM | POA: Diagnosis not present

## 2019-02-01 DIAGNOSIS — R29898 Other symptoms and signs involving the musculoskeletal system: Secondary | ICD-10-CM | POA: Diagnosis not present

## 2019-02-27 DIAGNOSIS — K921 Melena: Secondary | ICD-10-CM | POA: Diagnosis not present

## 2019-02-27 DIAGNOSIS — R0609 Other forms of dyspnea: Secondary | ICD-10-CM | POA: Diagnosis not present

## 2019-02-27 DIAGNOSIS — D62 Acute posthemorrhagic anemia: Secondary | ICD-10-CM | POA: Diagnosis not present

## 2019-02-27 DIAGNOSIS — D696 Thrombocytopenia, unspecified: Secondary | ICD-10-CM | POA: Diagnosis not present

## 2019-02-27 DIAGNOSIS — I10 Essential (primary) hypertension: Secondary | ICD-10-CM | POA: Diagnosis not present

## 2019-03-02 ENCOUNTER — Ambulatory Visit: Payer: Medicare Other | Admitting: Cardiology

## 2019-03-02 DIAGNOSIS — K921 Melena: Secondary | ICD-10-CM | POA: Diagnosis not present

## 2019-03-02 DIAGNOSIS — D500 Iron deficiency anemia secondary to blood loss (chronic): Secondary | ICD-10-CM | POA: Diagnosis not present

## 2019-03-02 DIAGNOSIS — K2100 Gastro-esophageal reflux disease with esophagitis, without bleeding: Secondary | ICD-10-CM | POA: Diagnosis not present

## 2019-03-02 DIAGNOSIS — K5730 Diverticulosis of large intestine without perforation or abscess without bleeding: Secondary | ICD-10-CM | POA: Diagnosis not present

## 2019-03-09 ENCOUNTER — Ambulatory Visit: Payer: Medicare Other | Attending: Internal Medicine

## 2019-03-09 DIAGNOSIS — Z23 Encounter for immunization: Secondary | ICD-10-CM | POA: Insufficient documentation

## 2019-03-09 NOTE — Progress Notes (Signed)
   Covid-19 Vaccination Clinic  Name:  Lonnie Hansen    MRN: [id number] DOB: Oct 10, 1950  03/09/2019  [name] was observed post Covid-19 immunization for 15 minutes without incidence. He was provided with Vaccine Information Sheet and instruction to access the V-Safe system.   [name] was instructed to call 911 with any severe reactions post vaccine: Marland Kitchen Difficulty breathing  . Swelling of your face and throat  . A fast heartbeat  . A bad rash all over your body  . Dizziness and weakness    Immunizations Administered    Name Date Dose VIS Date Route   Pfizer COVID-19 Vaccine 03/09/2019  1:33 PM 0.3 mL 01/06/2019 Intramuscular   Manufacturer: [place]   Lot: [id number]   [place]: [id number]

## 2019-04-01 ENCOUNTER — Ambulatory Visit: Payer: Medicare Other | Attending: Internal Medicine

## 2019-04-01 DIAGNOSIS — Z23 Encounter for immunization: Secondary | ICD-10-CM | POA: Insufficient documentation

## 2019-04-01 NOTE — Progress Notes (Signed)
   Covid-19 Vaccination Clinic  Name:  Lonnie Hansen    MRN: [id number] DOB: Jun 15, 1950  04/01/2019  [name] was observed post Covid-19 immunization for 15 minutes without incident. He was provided with Vaccine Information Sheet and instruction to access the V-Safe system.   [name] was instructed to call 911 with any severe reactions post vaccine: Marland Kitchen Difficulty breathing  . Swelling of face and throat  . A fast heartbeat  . A bad rash all over body  . Dizziness and weakness   Immunizations Administered    Name Date Dose VIS Date Route   Pfizer COVID-19 Vaccine 04/01/2019 12:48 PM 0.3 mL 01/06/2019 Intramuscular   Manufacturer: [place]   Lot: [id number]   [place]: [id number]

## 2019-04-12 DIAGNOSIS — J309 Allergic rhinitis, unspecified: Secondary | ICD-10-CM | POA: Diagnosis not present

## 2019-04-12 DIAGNOSIS — R42 Dizziness and giddiness: Secondary | ICD-10-CM | POA: Diagnosis not present

## 2019-04-12 DIAGNOSIS — H6590 Unspecified nonsuppurative otitis media, unspecified ear: Secondary | ICD-10-CM | POA: Diagnosis not present

## 2019-04-12 DIAGNOSIS — I10 Essential (primary) hypertension: Secondary | ICD-10-CM | POA: Diagnosis not present

## 2019-04-12 DIAGNOSIS — D62 Acute posthemorrhagic anemia: Secondary | ICD-10-CM | POA: Diagnosis not present

## 2019-04-14 ENCOUNTER — Other Ambulatory Visit: Payer: Self-pay

## 2019-04-14 ENCOUNTER — Inpatient Hospital Stay (HOSPITAL_COMMUNITY)
Admission: EM | Admit: 2019-04-14 | Discharge: 2019-04-18 | DRG: 378 | Disposition: A | Discharge status: Home / Self Care | Payer: Medicare Other | Attending: Family Medicine | Admitting: Family Medicine

## 2019-04-14 ENCOUNTER — Encounter (HOSPITAL_COMMUNITY): Payer: Self-pay | Admitting: Emergency Medicine

## 2019-04-14 DIAGNOSIS — I10 Essential (primary) hypertension: Secondary | ICD-10-CM | POA: Diagnosis not present

## 2019-04-14 DIAGNOSIS — I2510 Atherosclerotic heart disease of native coronary artery without angina pectoris: Secondary | ICD-10-CM | POA: Diagnosis present

## 2019-04-14 DIAGNOSIS — Z20822 Contact with and (suspected) exposure to covid-19: Secondary | ICD-10-CM | POA: Diagnosis present

## 2019-04-14 DIAGNOSIS — Z87442 Personal history of urinary calculi: Secondary | ICD-10-CM

## 2019-04-14 DIAGNOSIS — M1990 Unspecified osteoarthritis, unspecified site: Secondary | ICD-10-CM | POA: Diagnosis present

## 2019-04-14 DIAGNOSIS — K625 Hemorrhage of anus and rectum: Secondary | ICD-10-CM | POA: Diagnosis not present

## 2019-04-14 DIAGNOSIS — E785 Hyperlipidemia, unspecified: Secondary | ICD-10-CM | POA: Diagnosis present

## 2019-04-14 DIAGNOSIS — K922 Gastrointestinal hemorrhage, unspecified: Secondary | ICD-10-CM | POA: Diagnosis present

## 2019-04-14 DIAGNOSIS — Z6831 Body mass index (BMI) 31.0-31.9, adult: Secondary | ICD-10-CM

## 2019-04-14 DIAGNOSIS — Z8249 Family history of ischemic heart disease and other diseases of the circulatory system: Secondary | ICD-10-CM

## 2019-04-14 DIAGNOSIS — Z87891 Personal history of nicotine dependence: Secondary | ICD-10-CM

## 2019-04-14 DIAGNOSIS — K5751 Diverticulosis of both small and large intestine without perforation or abscess with bleeding: Secondary | ICD-10-CM | POA: Diagnosis not present

## 2019-04-14 DIAGNOSIS — Z79899 Other long term (current) drug therapy: Secondary | ICD-10-CM

## 2019-04-14 DIAGNOSIS — Z953 Presence of xenogenic heart valve: Secondary | ICD-10-CM

## 2019-04-14 DIAGNOSIS — K3189 Other diseases of stomach and duodenum: Secondary | ICD-10-CM | POA: Diagnosis present

## 2019-04-14 DIAGNOSIS — M1611 Unilateral primary osteoarthritis, right hip: Secondary | ICD-10-CM | POA: Diagnosis present

## 2019-04-14 DIAGNOSIS — G4733 Obstructive sleep apnea (adult) (pediatric): Secondary | ICD-10-CM | POA: Diagnosis present

## 2019-04-14 DIAGNOSIS — Z03818 Encounter for observation for suspected exposure to other biological agents ruled out: Secondary | ICD-10-CM | POA: Diagnosis not present

## 2019-04-14 DIAGNOSIS — N400 Enlarged prostate without lower urinary tract symptoms: Secondary | ICD-10-CM | POA: Diagnosis present

## 2019-04-14 DIAGNOSIS — Z7982 Long term (current) use of aspirin: Secondary | ICD-10-CM

## 2019-04-14 DIAGNOSIS — H409 Unspecified glaucoma: Secondary | ICD-10-CM | POA: Diagnosis present

## 2019-04-14 DIAGNOSIS — E663 Overweight: Secondary | ICD-10-CM | POA: Diagnosis present

## 2019-04-14 DIAGNOSIS — K921 Melena: Secondary | ICD-10-CM | POA: Diagnosis not present

## 2019-04-14 DIAGNOSIS — K219 Gastro-esophageal reflux disease without esophagitis: Secondary | ICD-10-CM | POA: Diagnosis present

## 2019-04-14 DIAGNOSIS — D693 Immune thrombocytopenic purpura: Secondary | ICD-10-CM | POA: Diagnosis not present

## 2019-04-14 DIAGNOSIS — Z8349 Family history of other endocrine, nutritional and metabolic diseases: Secondary | ICD-10-CM

## 2019-04-14 DIAGNOSIS — K648 Other hemorrhoids: Secondary | ICD-10-CM | POA: Diagnosis present

## 2019-04-14 DIAGNOSIS — D62 Acute posthemorrhagic anemia: Secondary | ICD-10-CM | POA: Diagnosis present

## 2019-04-14 DIAGNOSIS — Z952 Presence of prosthetic heart valve: Secondary | ICD-10-CM

## 2019-04-14 DIAGNOSIS — Z8719 Personal history of other diseases of the digestive system: Secondary | ICD-10-CM

## 2019-04-14 LAB — COMPREHENSIVE METABOLIC PANEL
ALT: 9 U/L (ref 0–44)
AST: 27 U/L (ref 15–41)
Albumin: 4.7 g/dL (ref 3.5–5.0)
Alkaline Phosphatase: 47 U/L (ref 38–126)
Anion gap: 11 (ref 5–15)
BUN: 12 mg/dL (ref 8–23)
CO2: 25 mmol/L (ref 22–32)
Calcium: 9.4 mg/dL (ref 8.9–10.3)
Chloride: 102 mmol/L (ref 98–111)
Creatinine, Ser: 1.06 mg/dL (ref 0.61–1.24)
GFR calc Af Amer: >60 mL/min (ref >60)
GFR calc non Af Amer: >60 mL/min (ref >60)
Glucose, Bld: 119 mg/dL — ABNORMAL HIGH (ref 70–99)
Potassium: 4.1 mmol/L (ref 3.5–5.1)
Sodium: 138 mmol/L (ref 135–145)
Total Bilirubin: 0.7 mg/dL (ref 0.3–1.2)
Total Protein: 7.8 g/dL (ref 6.5–8.1)

## 2019-04-14 LAB — CBC
HCT: 39.9 % (ref 39.0–52.0)
Hemoglobin: 12.9 g/dL — ABNORMAL LOW (ref 13.0–17.0)
MCH: 29.9 pg (ref 26.0–34.0)
MCHC: 32.3 g/dL (ref 30.0–36.0)
MCV: 92.4 fL (ref 80.0–100.0)
Platelets: 108 10*3/uL — ABNORMAL LOW (ref 150–400)
RBC: 4.32 MIL/uL (ref 4.22–5.81)
RDW: 13.4 % (ref 11.5–15.5)
WBC: 6 10*3/uL (ref 4.0–10.5)
nRBC: 0 % (ref 0.0–0.2)

## 2019-04-14 LAB — POC OCCULT BLOOD, ED: Fecal Occult Bld: POSITIVE — AB

## 2019-04-14 LAB — TYPE AND SCREEN
ABO/RH(D): AB POS
Antibody Screen: NEGATIVE

## 2019-04-14 MED ORDER — SODIUM CHLORIDE 0.9 % IV BOLUS
500.0000 mL | Freq: Once | INTRAVENOUS | Status: AC
  Administered 2019-04-14: 500 mL via INTRAVENOUS

## 2019-04-14 MED ORDER — ACETAMINOPHEN 325 MG PO TABS
650.0000 mg | ORAL_TABLET | Freq: Four times a day (QID) | ORAL | Status: DC | PRN
  Administered 2019-04-15 – 2019-04-17 (×5): 650 mg via ORAL
  Filled 2019-04-14 (×5): qty 2

## 2019-04-14 MED ORDER — ONDANSETRON HCL 4 MG/2ML IJ SOLN
4.0000 mg | Freq: Four times a day (QID) | INTRAMUSCULAR | Status: DC | PRN
  Administered 2019-04-16: 4 mg via INTRAVENOUS

## 2019-04-14 MED ORDER — SODIUM CHLORIDE 0.9 % IV SOLN
8.0000 mg/h | INTRAVENOUS | Status: DC
  Administered 2019-04-15 – 2019-04-17 (×5): 8 mg/h via INTRAVENOUS
  Filled 2019-04-14 (×8): qty 80

## 2019-04-14 MED ORDER — ACETAMINOPHEN 650 MG RE SUPP: 650.0000 mg | Freq: Four times a day (QID) | RECTAL | Status: DC | PRN

## 2019-04-14 MED ORDER — SODIUM CHLORIDE 0.9 % IV SOLN
80.0000 mg | Freq: Once | INTRAVENOUS | Status: AC
  Administered 2019-04-14: 80 mg via INTRAVENOUS
  Filled 2019-04-14: qty 80

## 2019-04-14 MED ORDER — LATANOPROST 0.005 % OP SOLN
1.0000 [drp] | Freq: Every day | OPHTHALMIC | Status: DC
  Administered 2019-04-15 – 2019-04-17 (×4): 1 [drp] via OPHTHALMIC
  Filled 2019-04-14: qty 2.5

## 2019-04-14 MED ORDER — ONDANSETRON HCL 4 MG PO TABS: 4.0000 mg | ORAL_TABLET | Freq: Four times a day (QID) | ORAL | Status: DC | PRN

## 2019-04-14 MED ORDER — LABETALOL HCL 5 MG/ML IV SOLN: 10.0000 mg | INTRAVENOUS | Status: DC | PRN

## 2019-04-14 MED ORDER — PANTOPRAZOLE SODIUM 40 MG IV SOLR: 40.0000 mg | Freq: Two times a day (BID) | INTRAVENOUS | Status: DC

## 2019-04-14 NOTE — ED Notes (Signed)
Pt ambulated to restroom with no assistance necessary. Steady gait noted.

## 2019-04-14 NOTE — ED Provider Notes (Signed)
[facility] EMERGENCY DEPARTMENT Provider Note   CSN: [id number] Arrival date & time: 04/14/19  1518     History Chief Complaint  Patient presents with  . Rectal Bleeding    Lonnie Hansen is a 69 y.o. male with a past medical history significant for anemia, chronic idiopathic thrombocytopenia, diverticulosis, GERD, and hyperlipidemia who presents to the ED due to rectal bleeding that started this morning.  Wife is at bedside.  Patient states he started having a few episodes of rectal bleeding back in January in which he saw his PCP.  [facility] televisit with [facility] who plans on performing a colonoscopy and endoscopy once patient is fully vaccinated for Covid.  Patient just completed his second Covid vaccine 2 and half weeks ago.  Patient's wife notes that earlier this morning she walked in the bathroom to find dark red blood all over the bathroom floor.  He had one previous episode earlier today where there was blood on the toilet paper, but not in the toilet bowel. Patient states he pulled down his pants to have a bowel movement when blood and stool came out of his rectum.  Patient notes stool was dark in color and malodorous.  His last antibiotics were doxycycline roughly 1 month ago.  Patient also notes rectal bleeding is associated with lightheadedness.  He was seen by his PCP on Wednesday and treated for vertigo.  He is also been placed on chronic protonix for his GI bleed which he stopped Monday-Wednesday of this week because he thought it was causing constipation. Patient admits to abdominal fullness, but denies abdominal pain.  Denies nausea, vomiting, fever, chills, chest pain, shortness of breath. Denies urinary symptoms. He is not currently on any blood thinners. Admits to 2, if not more goody powders daily. Denies alcohol use.   History obtained from patient and past medical records. No interpreter used during encounter.     Past Medical History:  Diagnosis Date   . Anemia   . Arthritis   . Asymptomatic bilateral carotid artery stenosis 08/2015   1-39%   . Blood dyscrasia    "trouble with my bloods clotting"  . Bruising    on the skin states due to platelets or sometimes low or high  . Cataracts, bilateral   . 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 Coronary artery disease   . Diverticulosis   . Enlarged aorta ([place])   . Enlarged prostate    slightly  . GERD (gastroesophageal reflux disease)    takes Pantoprazole daily as needed  . Glaucoma    uses eye drops daily  . Headache   . History of colon polyps    benign  . History of kidney stones   . Hyperlipidemia    no on any meds  . Hypertension    takes Amlodipine and Atenolol daily  . Joint pain   . Joint swelling   . Nocturia   . OSA on CPAP   . Sleep apnea   . Vocal cord nodule    pt. states  it's a" growth on vocal cord"    Patient Active Problem List   Diagnosis Date Noted  . Unilateral primary osteoarthritis, 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  . 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 Obstructive sleep apnea treated with continuous positive airway pressure (CPAP) 03/04/2017  . Vocal cord nodule   . Sleep apnea   . Nocturia   . Joint swelling   . Joint pain   .  Hypertension   . History of kidney stones   . History of colon polyps   . Headache   . Glaucoma   . GERD (gastroesophageal reflux disease)   . Enlarged prostate   . Enlarged aorta ([place])   . Diverticulosis   . Coronary artery disease   . Cataracts, bilateral   . Bruising   . Blood dyscrasia   . Arthritis   . Anemia   . Hydropneumothorax 10/29/2016  . S/P AVR 09/15/2016  . Coronary artery disease involving native coronary artery of native heart without angina pectoris 04/29/2016  . Aortic insufficiency 04/01/2016  . Thoracic aortic aneurysm ([place]) 09/18/2015  . HTN (hypertension) 09/18/2015  . Hyperlipidemia 09/18/2015  . Asymptomatic bilateral carotid artery stenosis  08/27/2015    Past Surgical History:  Procedure Laterality Date  . AORTIC ARCH ANGIOGRAPHY N/A 04/16/2016   Procedure: Aortic Arch Angiography;  Surgeon: [name] Martinique, MD;  Location: [facility] [place] [facility];  Service: Cardiovascular;  Laterality: N/A;  . AORTIC VALVE REPLACEMENT N/A 09/15/2016   Procedure: AORTIC VALVE REPLACEMENT (AVR);  Surgeon: Grace Isaac, MD;  Location: [facility];  Service: Open Heart Surgery;  Laterality: N/A;  Using 18mm Edwards Perimount Magna Ease Aortic Bioprosthesis Valve  . ASCENDING AORTIC ROOT REPLACEMENT N/A 09/15/2016   Procedure: ASCENDING AORTIC ROOT REPLACEMENT;  Surgeon: Grace Isaac, MD;  Location: [facility];  Service: Open Heart Surgery;  Laterality: N/A;  Using 39mm Gelweave Valsalva Graft  . COLONOSCOPY    . COLONOSCOPY WITH ESOPHAGOGASTRODUODENOSCOPY (EGD)    . IR THORACENTESIS ASP PLEURAL SPACE W/IMG GUIDE  10/23/2016  . MICROLARYNGOSCOPY Right 09/20/2017   Procedure: MICROLARYNGOSCOPY WITH  EXCISION OF VOCAL CORD LESION;  Surgeon: Leta Baptist, MD;  Location: [facility];  Service: ENT;  Laterality: Right;  . MULTIPLE EXTRACTIONS WITH ALVEOLOPLASTY N/A 06/10/2016   Procedure: Extraction of tooth #'s 2,8,13,15, and 29  with alveoloplasty, maxillary right and left buccal exostoses reductions, and gross debridement of remaining teeth.;  Surgeon: Lenn Cal, DDS;  Location: [facility];  Service: Oral Surgery;  Laterality: N/A;  . RIGHT/LEFT HEART CATH AND CORONARY ANGIOGRAPHY N/A 04/16/2016   Procedure: Right/Left Heart Cath and Coronary Angiography;  Surgeon: [name] Martinique, MD;  Location: [place] [facility];  Service: Cardiovascular;  Laterality: N/A;  . TEE WITHOUT CARDIOVERSION N/A 09/15/2016   Procedure: TRANSESOPHAGEAL ECHOCARDIOGRAM (TEE);  Surgeon: Grace Isaac, MD;  Location: [place] [facility];  Service: Open Heart Surgery;  Laterality: N/A;       Family History  Problem Relation Age of Onset  . Heart disease Father   . Heart attack  Father   . Thyroid disease Sister     Social History   Tobacco Use  . Smoking status: Former Smoker    Packs/day: 1.00    Years: 25.00    Pack years: 25.00    Types: Cigarettes  . Smokeless tobacco: Never Used  Substance Use Topics  . Alcohol use: No    Alcohol/week: 0.0 standard drinks  . Drug use: No    Home Medications Prior to Admission medications   Medication Sig Start Date End Date Taking? Authorizing Provider  amLODipine (NORVASC) 10 MG tablet Take 10 mg by mouth daily.    [provider]  aspirin EC 81 MG EC tablet Take 1 tablet (81 mg total) by mouth daily. 09/24/16   Gold, Wayne E, PA-C  atenolol (TENORMIN) 25 MG tablet Take 0.5 tablets (12.5 mg total) by mouth 2 (two)  times daily. 09/23/16   Gold, Wayne E, PA-C  latanoprost (XALATAN) 0.005 % ophthalmic solution Place 1 drop into both eyes at bedtime.    [provider]  Multiple Vitamin (MULTIVITAMIN WITH MINERALS) TABS tablet Take 1 tablet by mouth daily.    [provider]  rosuvastatin (CRESTOR) 10 MG tablet Take 1 tablet by mouth once daily 01/10/19   Martinique, [name], MD  tamsulosin (FLOMAX) 0.4 MG CAPS capsule Take 0.4 mg by mouth daily. 01/25/18   [provider]    Allergies    Patient has no known allergies.  Review of Systems   Review of Systems  Constitutional: Negative for chills and fever.  Respiratory: Negative for shortness of breath.   Cardiovascular: Negative for chest pain.  Gastrointestinal: Positive for abdominal distention (abdominal fullness), anal bleeding, blood in stool and constipation. Negative for abdominal pain, nausea and vomiting.  Genitourinary: Negative for dysuria.  Neurological: Positive for light-headedness.  All other systems reviewed and are negative.   Physical Exam Updated Vital Signs BP (!) 159/101   Pulse 64   Temp 98.8 F (37.1 C) (Oral)   Resp 18   Ht 5\' 9"  (1.753 m)   Wt 95.3 kg   SpO2 100%   BMI 31.01 kg/m   Physical  Exam Vitals and nursing note reviewed.  Constitutional:      General: He is not in acute distress.    Appearance: He is not ill-appearing.  HENT:     Head: Normocephalic.  Eyes:     Pupils: Pupils are equal, round, and reactive to light.  Cardiovascular:     Rate and Rhythm: Normal rate and regular rhythm.     Pulses: Normal pulses.     Heart sounds: Normal heart sounds. No murmur. No friction rub. No gallop.   Pulmonary:     Effort: Pulmonary effort is normal.     Breath sounds: Normal breath sounds.  Abdominal:     General: Abdomen is flat. Bowel sounds are normal. There is no distension.     Palpations: Abdomen is soft.     Tenderness: There is no abdominal tenderness. There is no guarding or rebound.     Comments: Abdomen soft, nondistended, nontender to palpation in all quadrants without guarding or peritoneal signs. No rebound.   Genitourinary:    Comments: Rectal exam performed. No external hemorrhoids. Stool sample obtained which was just bright red blood.  Musculoskeletal:     Cervical back: Neck supple.     Comments: Able to move all 4 extremities without difficulty.  Skin:    General: Skin is warm and dry.  Neurological:     General: No focal deficit present.     Mental Status: He is alert.  Psychiatric:        Mood and Affect: Mood normal.        Behavior: Behavior normal.     ED Results / Procedures / Treatments   Labs (all labs ordered are listed, but only abnormal results are displayed) Labs Reviewed  COMPREHENSIVE METABOLIC PANEL - Abnormal; Notable for the following components:      Result Value   Glucose, Bld 119 (*)    All other components within normal limits  CBC - Abnormal; Notable for the following components:   Hemoglobin 12.9 (*)    Platelets 108 (*)    All other components within normal limits  PATHOLOGIST SMEAR REVIEW  POC OCCULT BLOOD, ED  TYPE AND SCREEN    EKG None  Radiology No results found.  Procedures Procedures (including  critical care time)  Medications Ordered in ED Medications - No data to display  ED Course  I have reviewed the triage vital signs and the nursing notes.  Pertinent labs & imaging results that were available during my care of the patient were reviewed by me and considered in my medical decision making (see chart for details).  Clinical Course as of Apr 13 2136  Fri Apr 14, 2019  1724 Hemoglobin(!): 12.9 [CA]  1925 Spoke to [name] with GI and notes that with a normal BUN, doubt upper GI bleed. He recommends that if patient has another episode of rectal bleeding while here in the ED admit for observation. [name] will check patient's chart tomorrow morning to see if he was admitted.   BUN: 12 [CA]  2038 Fecal Occult Blood, POC(!): POSITIVE [CA]  2114 Reassessed patient at bedside.  He notes that his last bowel movement here in the ED consisted of full blood in the toilet bowl.  Will call for medical admission given Dr. Erlinda Hong recommendation.   [CA]    Clinical Course User Index [CA] Suzy Bouchard, PA-C   MDM Rules/Calculators/A&P                      69 year old male presents the ED due to rectal bleeding that started this morning.  History of the same back in January.  He was seen by [name] who plans to do a colonoscopy and endoscopy.  Stable vitals.  Patient no acute distress and non-ill-appearing.  Physical exam reassuring.  Abdomen soft, nondistended, and nontender.  Will obtain routine labs.  CBC reassuring with no leukocytosis.  Stable hemoglobin at 12.9.  CMP reassuring with normal renal function and no electrolyte derangements.  Fecal occult positive.  Discussed case with [name] with GI.  See note above. Discussed case with Dr. Regenia Skeeter who evaluated patient at bedside and agrees with assessment and plan.  Patient had another episode of copious blood in the toilet bowel. Will consult [facility] for medical admission.  Spoke to Dr. Hal Hope with [facility] who agrees to  admit patient for observation. COVID order placed.  Final Clinical Impression(s) / ED Diagnoses Final diagnoses:  None    Rx / DC Orders ED Discharge Orders    None       Karie Kirks 04/14/19 2156    Sherwood Gambler, MD 04/16/19 [id number]

## 2019-04-14 NOTE — H&P (Signed)
History and Physical    PALMER FAHRNER MEQ:[id number] DOB: Aug 15, 1950 DOA: 04/14/2019  PCP: [name], MD  Patient coming from: Home.  Chief Complaint: Rectal bleeding.  HPI: Lonnie Hansen is a 69 y.o. male with history of aortic root grafting with aortic valve replacement with tissue valve, CAD medically managed hypertension history of chronic ITP presents to the ER with complaints of having rectal bleeding.  Patient states he had some bleeding about 3 months ago and at the time patient's primary care had referred him to gastroenterology.  Gastroenterology Dr. Watt Climes was planning to have a endoscopy and colonoscopy done next week but patient started having again black tarry stool at home today.  Per patient is a large volume.  And was associated no abdominal pain or nausea vomiting.  Over the last 1 week patient has been taking Goody powders for right hip pain and is scheduled to have surgery of the right hip later.  Denies chest pain shortness of breath dizziness.  ED Course: In the ER patient after he had 2-3 bowel movement which was frankly bloody.  Hemoglobin was around 12.9 platelets 108 LFTs were normal along with creatinine being normal.  Covid test was negative stool for occult was positive.  ER physician discussed with [name] on-call [facility] gastroenterologist will be following patient.  Given that patient has a mixed picture of upper and lower GI bleed Protonix has been started.  Admit for further management.  Review of Systems: As per HPI, rest all negative.   Past Medical History:  Diagnosis Date  . Anemia   . Arthritis   . Asymptomatic bilateral carotid artery stenosis 08/2015   1-39%   . Blood dyscrasia    "trouble with my bloods clotting"  . Bruising    on the skin states due to platelets or sometimes low or high  . Cataracts, bilateral   . 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 Coronary artery disease   . Diverticulosis   . Enlarged aorta  ([place])   . Enlarged prostate    slightly  . GERD (gastroesophageal reflux disease)    takes Pantoprazole daily as needed  . Glaucoma    uses eye drops daily  . Headache   . History of colon polyps    benign  . History of kidney stones   . Hyperlipidemia    no on any meds  . Hypertension    takes Amlodipine and Atenolol daily  . Joint pain   . Joint swelling   . Nocturia   . OSA on CPAP   . Sleep apnea   . Vocal cord nodule    pt. states  it's a" growth on vocal cord"    Past Surgical History:  Procedure Laterality Date  . AORTIC ARCH ANGIOGRAPHY N/A 04/16/2016   Procedure: Aortic Arch Angiography;  Surgeon: [name] Martinique, MD;  Location: [place] [facility];  Service: Cardiovascular;  Laterality: N/A;  . AORTIC VALVE REPLACEMENT N/A 09/15/2016   Procedure: AORTIC VALVE REPLACEMENT (AVR);  Surgeon: Grace Isaac, MD;  Location: [facility] [facility];  Service: Open Heart Surgery;  Laterality: N/A;  Using 42mm Edwards Perimount Magna Ease Aortic Bioprosthesis Valve  . ASCENDING AORTIC ROOT REPLACEMENT N/A 09/15/2016   Procedure: ASCENDING AORTIC ROOT REPLACEMENT;  Surgeon: Grace Isaac, MD;  Location: [place];  Service: Open Heart Surgery;  Laterality: N/A;  Using 52mm Gelweave Valsalva Graft  . COLONOSCOPY    . COLONOSCOPY WITH ESOPHAGOGASTRODUODENOSCOPY (EGD)    .  IR THORACENTESIS ASP PLEURAL SPACE W/IMG GUIDE  10/23/2016  . MICROLARYNGOSCOPY Right 09/20/2017   Procedure: MICROLARYNGOSCOPY WITH  EXCISION OF VOCAL CORD LESION;  Surgeon: Leta Baptist, MD;  Location: [place];  Service: ENT;  Laterality: Right;  . MULTIPLE EXTRACTIONS WITH ALVEOLOPLASTY N/A 06/10/2016   Procedure: Extraction of tooth #'s 2,8,13,15, and 29  with alveoloplasty, maxillary right and left buccal exostoses reductions, and gross debridement of remaining teeth.;  Surgeon: Lenn Cal, DDS;  Location: [place] [facility];  Service: Oral Surgery;  Laterality: N/A;  . RIGHT/LEFT HEART CATH AND CORONARY ANGIOGRAPHY  N/A 04/16/2016   Procedure: Right/Left Heart Cath and Coronary Angiography;  Surgeon: [name] Martinique, MD;  Location: [facility];  Service: Cardiovascular;  Laterality: N/A;  . TEE WITHOUT CARDIOVERSION N/A 09/15/2016   Procedure: TRANSESOPHAGEAL ECHOCARDIOGRAM (TEE);  Surgeon: Grace Isaac, MD;  Location: [place];  Service: Open Heart Surgery;  Laterality: N/A;     reports that he has quit smoking. His smoking use included cigarettes. He has a 25.00 pack-year smoking history. He has never used smokeless tobacco. He reports that he does not drink alcohol or use drugs.  No Known Allergies  Family History  Problem Relation Age of Onset  . Heart disease Father   . Heart attack Father   . Thyroid disease Sister     Prior to Admission medications   Medication Sig Start Date End Date Taking? Authorizing Provider  amLODipine (NORVASC) 10 MG tablet Take 10 mg by mouth daily.   Yes [provider]  aspirin EC 81 MG EC tablet Take 1 tablet (81 mg total) by mouth daily. 09/24/16  Yes Gold, Wayne E, PA-C  atenolol (TENORMIN) 25 MG tablet Take 0.5 tablets (12.5 mg total) by mouth 2 (two) times daily. 09/23/16  Yes Gold, Wayne E, PA-C  latanoprost (XALATAN) 0.005 % ophthalmic solution Place 1 drop into both eyes at bedtime.   Yes [provider]  Multiple Vitamin (MULTIVITAMIN WITH MINERALS) TABS tablet Take 1 tablet by mouth daily.   Yes [provider]  rosuvastatin (CRESTOR) 10 MG tablet Take 1 tablet by mouth once daily 01/10/19  Yes Martinique, [name], MD  tamsulosin (FLOMAX) 0.4 MG CAPS capsule Take 0.4 mg by mouth daily. 01/25/18  Yes [provider]    Physical Exam: Constitutional: Moderately built and nourished. Vitals:   04/14/19 2000 04/14/19 2015 04/14/19 2030 04/14/19 2045  BP: 129/86 135/82 118/75 126/74  Pulse:      Resp:      Temp:      TempSrc:      SpO2: 97% 100% 99% 99%  Weight:      Height:       Eyes: Anicteric no pallor. ENMT:  No discharge from the ears eyes nose or mouth. Neck: No mass or.  No neck rigidity. Respiratory: No rhonchi or crepitations. Cardiovascular: S1-S2 heard. Abdomen: Soft nontender bowel sounds present. Musculoskeletal: No edema. Skin: No rash. Neurologic: Alert awake oriented time place and person.  Moves all extremities. Psychiatric: Appears normal per normal affect.   Labs on Admission: I have personally reviewed following labs and imaging studies  CBC: Recent Labs  Lab 04/14/19 1535  WBC 6.0  HGB 12.9*  HCT 39.9  MCV 92.4  PLT 378*   Basic Metabolic Panel: Recent Labs  Lab 04/14/19 1535  NA 138  K 4.1  CL 102  CO2 25  GLUCOSE 119*  BUN 12  CREATININE 1.06  CALCIUM 9.4  GFR: Estimated Creatinine Clearance: 74.9 mL/min (by C-G formula based on SCr of 1.06 mg/dL). Liver Function Tests: Recent Labs  Lab 04/14/19 1535  AST 27  ALT 9  ALKPHOS 47  BILITOT 0.7  PROT 7.8  ALBUMIN 4.7   No results for input(s): LIPASE, AMYLASE in the last 168 hours. No results for input(s): AMMONIA in the last 168 hours. Coagulation Profile: No results for input(s): INR, PROTIME in the last 168 hours. Cardiac Enzymes: No results for input(s): CKTOTAL, CKMB, CKMBINDEX, TROPONINI in the last 168 hours. BNP (last 3 results) No results for input(s): PROBNP in the last 8760 hours. HbA1C: No results for input(s): HGBA1C in the last 72 hours. CBG: No results for input(s): GLUCAP in the last 168 hours. Lipid Profile: No results for input(s): CHOL, HDL, LDLCALC, TRIG, CHOLHDL, LDLDIRECT in the last 72 hours. Thyroid Function Tests: No results for input(s): TSH, T4TOTAL, FREET4, T3FREE, THYROIDAB in the last 72 hours. Anemia Panel: No results for input(s): VITAMINB12, FOLATE, FERRITIN, TIBC, IRON, RETICCTPCT in the last 72 hours. Urine analysis:    Component Value Date/Time   COLORURINE YELLOW 09/11/2016 [place] 09/11/2016 1047   LABSPEC 1.013 09/11/2016 1047    PHURINE 5.0 09/11/2016 1047   GLUCOSEU NEGATIVE 09/11/2016 1047   HGBUR SMALL (A) 09/11/2016 1047   BILIRUBINUR NEGATIVE 09/11/2016 1047   KETONESUR NEGATIVE 09/11/2016 1047   PROTEINUR NEGATIVE 09/11/2016 1047   UROBILINOGEN 0.2 04/27/2010 2330   NITRITE NEGATIVE 09/11/2016 1047   LEUKOCYTESUR NEGATIVE 09/11/2016 1047   Sepsis Labs: @LABRCNTIP (procalcitonin:4,lacticidven:4) )No results found for this or any previous visit (from the past 240 hour(s)).   Radiological Exams on Admission: No results found.    Assessment/Plan Principal Problem:   Rectal bleeding Active Problems:   HTN (hypertension)   S/P AVR   Chronic ITP (idiopathic thrombocytopenia) (HCC)   Acute blood loss anemia   Acute GI bleeding    1. Acute GI bleed has not mixed with features of either upper or lower GI bleed.  Since patient had taken Goody powders and also had melanotic stools initially we will keep patient on Protonix keep n.p.o. follow serial CBC avoid antiplatelet agents for now and [name] gastroenterology was notified by ER physician. 2. Acute blood loss anemia follow CBC. 3. Chronic ITP being followed by [name].  Follow CBC closely. 4. History of CAD medically managed presently we will keep patient n.p.o. avoid any antiplatelet agents given the acute GI bleed. 5. Hypertension blood pressures in the low normal we will keep patient on as needed IV labetalol if blood pressure more than on the 60. 6. History of aortic root grafting and aortic valve replacement with tissue valve.  No acute issues at this time.   DVT prophylaxis: SCDs for now avoiding anticoagulation due to acute GI bleed. Code Status: Full code. Family Communication: Discussed with patient. Disposition Plan: Home. Consults called: ER physician and notify [name] gastroenterologist. Admission status: Observation.   Rise Patience MD [facility] Pager [phone].  If 7PM-7AM, please contact  night-coverage [email or web address] Password [email or web address]  04/14/2019, 10:14 PM

## 2019-04-14 NOTE — ED Triage Notes (Signed)
Pt reports an acute onset of black stools that started this morning has had 2 episodes. Pt report mild abd cramping. Pt is alert and 0x4, vitals stable.

## 2019-04-15 ENCOUNTER — Other Ambulatory Visit: Payer: Self-pay

## 2019-04-15 DIAGNOSIS — Z8719 Personal history of other diseases of the digestive system: Secondary | ICD-10-CM | POA: Diagnosis not present

## 2019-04-15 DIAGNOSIS — K922 Gastrointestinal hemorrhage, unspecified: Secondary | ICD-10-CM | POA: Diagnosis not present

## 2019-04-15 DIAGNOSIS — Z87891 Personal history of nicotine dependence: Secondary | ICD-10-CM | POA: Diagnosis not present

## 2019-04-15 DIAGNOSIS — G4733 Obstructive sleep apnea (adult) (pediatric): Secondary | ICD-10-CM | POA: Diagnosis present

## 2019-04-15 DIAGNOSIS — Z8249 Family history of ischemic heart disease and other diseases of the circulatory system: Secondary | ICD-10-CM | POA: Diagnosis not present

## 2019-04-15 DIAGNOSIS — I2510 Atherosclerotic heart disease of native coronary artery without angina pectoris: Secondary | ICD-10-CM | POA: Diagnosis not present

## 2019-04-15 DIAGNOSIS — K3189 Other diseases of stomach and duodenum: Secondary | ICD-10-CM | POA: Diagnosis not present

## 2019-04-15 DIAGNOSIS — Z20822 Contact with and (suspected) exposure to covid-19: Secondary | ICD-10-CM | POA: Diagnosis not present

## 2019-04-15 DIAGNOSIS — M1611 Unilateral primary osteoarthritis, right hip: Secondary | ICD-10-CM | POA: Diagnosis not present

## 2019-04-15 DIAGNOSIS — Z953 Presence of xenogenic heart valve: Secondary | ICD-10-CM | POA: Diagnosis not present

## 2019-04-15 DIAGNOSIS — Z6831 Body mass index (BMI) 31.0-31.9, adult: Secondary | ICD-10-CM | POA: Diagnosis not present

## 2019-04-15 DIAGNOSIS — D693 Immune thrombocytopenic purpura: Secondary | ICD-10-CM

## 2019-04-15 DIAGNOSIS — K648 Other hemorrhoids: Secondary | ICD-10-CM | POA: Diagnosis not present

## 2019-04-15 DIAGNOSIS — D62 Acute posthemorrhagic anemia: Secondary | ICD-10-CM | POA: Diagnosis not present

## 2019-04-15 DIAGNOSIS — K5751 Diverticulosis of both small and large intestine without perforation or abscess with bleeding: Secondary | ICD-10-CM | POA: Diagnosis not present

## 2019-04-15 DIAGNOSIS — D696 Thrombocytopenia, unspecified: Secondary | ICD-10-CM | POA: Diagnosis not present

## 2019-04-15 DIAGNOSIS — Z87442 Personal history of urinary calculi: Secondary | ICD-10-CM | POA: Diagnosis not present

## 2019-04-15 DIAGNOSIS — Z8349 Family history of other endocrine, nutritional and metabolic diseases: Secondary | ICD-10-CM | POA: Diagnosis not present

## 2019-04-15 DIAGNOSIS — E785 Hyperlipidemia, unspecified: Secondary | ICD-10-CM | POA: Diagnosis present

## 2019-04-15 DIAGNOSIS — I10 Essential (primary) hypertension: Secondary | ICD-10-CM | POA: Diagnosis not present

## 2019-04-15 DIAGNOSIS — M1990 Unspecified osteoarthritis, unspecified site: Secondary | ICD-10-CM | POA: Diagnosis present

## 2019-04-15 DIAGNOSIS — E663 Overweight: Secondary | ICD-10-CM | POA: Diagnosis not present

## 2019-04-15 DIAGNOSIS — H409 Unspecified glaucoma: Secondary | ICD-10-CM | POA: Diagnosis not present

## 2019-04-15 DIAGNOSIS — Z952 Presence of prosthetic heart valve: Secondary | ICD-10-CM | POA: Diagnosis not present

## 2019-04-15 DIAGNOSIS — N400 Enlarged prostate without lower urinary tract symptoms: Secondary | ICD-10-CM | POA: Diagnosis present

## 2019-04-15 DIAGNOSIS — D649 Anemia, unspecified: Secondary | ICD-10-CM | POA: Diagnosis not present

## 2019-04-15 DIAGNOSIS — K5710 Diverticulosis of small intestine without perforation or abscess without bleeding: Secondary | ICD-10-CM | POA: Diagnosis not present

## 2019-04-15 DIAGNOSIS — K625 Hemorrhage of anus and rectum: Secondary | ICD-10-CM | POA: Diagnosis not present

## 2019-04-15 DIAGNOSIS — K219 Gastro-esophageal reflux disease without esophagitis: Secondary | ICD-10-CM | POA: Diagnosis not present

## 2019-04-15 DIAGNOSIS — Z7982 Long term (current) use of aspirin: Secondary | ICD-10-CM | POA: Diagnosis not present

## 2019-04-15 DIAGNOSIS — K921 Melena: Secondary | ICD-10-CM | POA: Diagnosis not present

## 2019-04-15 LAB — CBC
HCT: 39.4 % (ref 39.0–52.0)
HCT: 32.8 % — ABNORMAL LOW (ref 39.0–52.0)
HCT: 32.7 % — ABNORMAL LOW (ref 39.0–52.0)
Hemoglobin: 12.7 g/dL — ABNORMAL LOW (ref 13.0–17.0)
Hemoglobin: 10.7 g/dL — ABNORMAL LOW (ref 13.0–17.0)
Hemoglobin: 10.7 g/dL — ABNORMAL LOW (ref 13.0–17.0)
MCH: 29.3 pg (ref 26.0–34.0)
MCH: 29.4 pg (ref 26.0–34.0)
MCH: 29.6 pg (ref 26.0–34.0)
MCHC: 32.2 g/dL (ref 30.0–36.0)
MCHC: 32.6 g/dL (ref 30.0–36.0)
MCHC: 32.7 g/dL (ref 30.0–36.0)
MCV: 91 fL (ref 80.0–100.0)
MCV: 90.1 fL (ref 80.0–100.0)
MCV: 90.6 fL (ref 80.0–100.0)
Platelets: 94 10*3/uL — ABNORMAL LOW (ref 150–400)
Platelets: 92 10*3/uL — ABNORMAL LOW (ref 150–400)
Platelets: 90 10*3/uL — ABNORMAL LOW (ref 150–400)
RBC: 4.33 MIL/uL (ref 4.22–5.81)
RBC: 3.64 MIL/uL — ABNORMAL LOW (ref 4.22–5.81)
RBC: 3.61 MIL/uL — ABNORMAL LOW (ref 4.22–5.81)
RDW: 13.7 % (ref 11.5–15.5)
RDW: 13.5 % (ref 11.5–15.5)
RDW: 13.6 % (ref 11.5–15.5)
WBC: 7.4 10*3/uL (ref 4.0–10.5)
WBC: 7.1 10*3/uL (ref 4.0–10.5)
WBC: 6.4 10*3/uL (ref 4.0–10.5)
nRBC: 0 % (ref 0.0–0.2)
nRBC: 0 % (ref 0.0–0.2)
nRBC: 0 % (ref 0.0–0.2)

## 2019-04-15 LAB — HIV ANTIBODY (ROUTINE TESTING W REFLEX): HIV Screen 4th Generation wRfx: NONREACTIVE

## 2019-04-15 LAB — SARS CORONAVIRUS 2 (TAT 6-24 HRS): SARS Coronavirus 2: NEGATIVE

## 2019-04-15 MED ORDER — BISACODYL 10 MG RE SUPP
10.0000 mg | Freq: Once | RECTAL | Status: AC
  Administered 2019-04-15: 10 mg via RECTAL
  Filled 2019-04-15: qty 1

## 2019-04-15 MED ORDER — PEG 3350-KCL-NA BICARB-NACL 420 G PO SOLR
4000.0000 mL | Freq: Once | ORAL | Status: AC
  Administered 2019-04-15: 4000 mL via ORAL
  Filled 2019-04-15: qty 4000

## 2019-04-15 MED ORDER — SODIUM CHLORIDE 0.9% IV SOLUTION: Freq: Once | INTRAVENOUS | Status: AC

## 2019-04-15 NOTE — Consult Note (Signed)
[facility] Consultation Note  Referring Provider: [facility] Primary Care Physician:  [name], MD Primary Gastroenterologist:  Dr. Watt Climes  Reason for Consultation:  GI bleeding  HPI: Lonnie Hansen is a 69 y.o. male presenting with GI bleeding.  Yesterday, had explosive episode of black tarry stools.  Had similar episode a couple months ago for which he was put on PPI.  Saw Dr. Watt Climes in February and plan was for patient to have endoscopy and colonoscopy in the near future.  Hgb ~12.5 --> 10.7 over the past 24 hours.  Since his admission, he has had some bright red hematochezia.  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, most recent platelets 90.  States he had colonoscopy and endoscopy for bleeding several years ago, and reports having hemorrhoids and reports having colonic polyps removed.  Reports having stool test (Cologuard?) done recently which was negative.  Takes aspirin, and also takes Goody's regularly for hip pain.   Past Medical History:  Diagnosis Date  . Anemia   . Arthritis   . Asymptomatic bilateral carotid artery stenosis 08/2015   1-39%   . Blood dyscrasia    "trouble with my bloods clotting"  . Bruising    on the skin states due to platelets or sometimes low or high  . Cataracts, bilateral   . 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 Coronary artery disease   . Diverticulosis   . Enlarged aorta ([place])   . Enlarged prostate    slightly  . GERD (gastroesophageal reflux disease)    takes Pantoprazole daily as needed  . Glaucoma    uses eye drops daily  . Headache   . History of colon polyps    benign  . History of kidney stones   . Hyperlipidemia    no on any meds  . Hypertension    takes Amlodipine and Atenolol daily  . Joint pain   . Joint swelling   . Nocturia   . OSA on CPAP   . Sleep apnea   . Vocal cord nodule    pt. states  it's a" growth on vocal cord"    Past Surgical History:   Procedure Laterality Date  . AORTIC ARCH ANGIOGRAPHY N/A 04/16/2016   Procedure: Aortic Arch Angiography;  Surgeon: [name] Martinique, MD;  Location: [place] [facility];  Service: Cardiovascular;  Laterality: N/A;  . AORTIC VALVE REPLACEMENT N/A 09/15/2016   Procedure: AORTIC VALVE REPLACEMENT (AVR);  Surgeon: Grace Isaac, MD;  Location: [place];  Service: Open Heart Surgery;  Laterality: N/A;  Using 12mm Edwards Perimount Magna Ease Aortic Bioprosthesis Valve  . ASCENDING AORTIC ROOT REPLACEMENT N/A 09/15/2016   Procedure: ASCENDING AORTIC ROOT REPLACEMENT;  Surgeon: Grace Isaac, MD;  Location: [place];  Service: Open Heart Surgery;  Laterality: N/A;  Using 61mm Gelweave Valsalva Graft  . COLONOSCOPY    . COLONOSCOPY WITH ESOPHAGOGASTRODUODENOSCOPY (EGD)    . IR THORACENTESIS ASP PLEURAL SPACE W/IMG GUIDE  10/23/2016  . MICROLARYNGOSCOPY Right 09/20/2017   Procedure: MICROLARYNGOSCOPY WITH  EXCISION OF VOCAL CORD LESION;  Surgeon: Leta Baptist, MD;  Location: [place];  Service: ENT;  Laterality: Right;  . MULTIPLE EXTRACTIONS WITH ALVEOLOPLASTY N/A 06/10/2016   Procedure: Extraction of tooth #'s 2,8,13,15, and 29  with alveoloplasty, maxillary right and left buccal exostoses reductions, and gross debridement of remaining teeth.;  Surgeon: Lenn Cal, DDS;  Location: [place];  Service: Oral Surgery;  Laterality: N/A;  .  RIGHT/LEFT HEART CATH AND CORONARY ANGIOGRAPHY N/A 04/16/2016   Procedure: Right/Left Heart Cath and Coronary Angiography;  Surgeon: [name] Martinique, MD;  Location: [place] [facility];  Service: Cardiovascular;  Laterality: N/A;  . TEE WITHOUT CARDIOVERSION N/A 09/15/2016   Procedure: TRANSESOPHAGEAL ECHOCARDIOGRAM (TEE);  Surgeon: Grace Isaac, MD;  Location: [place];  Service: Open Heart Surgery;  Laterality: N/A;    Prior to Admission medications   Medication Sig Start Date End Date Taking? Authorizing Provider  amLODipine (NORVASC) 10 MG tablet Take  10 mg by mouth daily.   Yes [provider]  aspirin EC 81 MG EC tablet Take 1 tablet (81 mg total) by mouth daily. 09/24/16  Yes Gold, Wayne E, PA-C  atenolol (TENORMIN) 25 MG tablet Take 0.5 tablets (12.5 mg total) by mouth 2 (two) times daily. 09/23/16  Yes Gold, Wayne E, PA-C  latanoprost (XALATAN) 0.005 % ophthalmic solution Place 1 drop into both eyes at bedtime.   Yes [provider]  Multiple Vitamin (MULTIVITAMIN WITH MINERALS) TABS tablet Take 1 tablet by mouth daily.   Yes [provider]  rosuvastatin (CRESTOR) 10 MG tablet Take 1 tablet by mouth once daily 01/10/19  Yes Martinique, [name], MD  tamsulosin (FLOMAX) 0.4 MG CAPS capsule Take 0.4 mg by mouth daily. 01/25/18  Yes [provider]    Current Facility-Administered Medications  Medication Dose Route Frequency Provider Last Rate Last Admin  . acetaminophen (TYLENOL) tablet 650 mg  650 mg Oral Q6H PRN Rise Patience, MD       Or  . acetaminophen (TYLENOL) suppository 650 mg  650 mg Rectal Q6H PRN Rise Patience, MD      . labetalol (NORMODYNE) injection 10 mg  10 mg Intravenous Q2H PRN Rise Patience, MD      . latanoprost (XALATAN) 0.005 % ophthalmic solution 1 drop  1 drop Both Eyes QHS Rise Patience, MD   1 drop at 04/15/19 0110  . ondansetron (ZOFRAN) tablet 4 mg  4 mg Oral Q6H PRN Rise Patience, MD       Or  . ondansetron [facility]) injection 4 mg  4 mg Intravenous Q6H PRN Rise Patience, MD      . pantoprazole (PROTONIX) 80 mg in sodium chloride 0.9 % 100 mL (0.8 mg/mL) infusion  8 mg/hr Intravenous Continuous Rise Patience, MD 10 mL/hr at 04/15/19 0109 8 mg/hr at 04/15/19 0109  . [START ON 04/18/2019] pantoprazole (PROTONIX) injection 40 mg  40 mg Intravenous Q12H Rise Patience, MD        Allergies as of 04/14/2019  . (No Known Allergies)    Family History  Problem Relation Age of Onset  . Heart disease Father   . Heart attack Father    . Thyroid disease Sister     Social History   Socioeconomic History  . Marital status: Married    Spouse name: Not on file  . Number of children: 1  . Years of education: Not on file  . Highest education level: Not on file  Occupational History  . Occupation: Musician  Tobacco Use  . Smoking status: Former Smoker    Packs/day: 1.00    Years: 25.00    Pack years: 25.00    Types: Cigarettes  . Smokeless tobacco: Never Used  Substance and Sexual Activity  . Alcohol use: No    Alcohol/week: 0.0 standard drinks  . Drug use: No  . Sexual activity: Not on  file  Other Topics Concern  . 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   Social Determinants of Health   Financial Resource Strain:   . Difficulty of Paying Living Expenses:   Food Insecurity:   . Worried About Charity fundraiser in the Last Year:   . Arboriculturist in the Last Year:   Transportation Needs:   . Film/video editor (Medical):   Marland Kitchen Lack of Transportation (Non-Medical):   Physical Activity:   . Days of Exercise per Week:   . Minutes of Exercise per Session:   Stress:   . Feeling of Stress :   Social Connections:   . Frequency of Communication with Friends and Family:   . Frequency of Social Gatherings with Friends and Family:   . Attends Religious Services:   . Active Member of Clubs or Organizations:   . Attends Archivist Meetings:   Marland Kitchen Marital Status:   Intimate Partner Violence:   . Fear of Current or Ex-Partner:   . Emotionally Abused:   Marland Kitchen Physically Abused:   . Sexually Abused:     Review of Systems: As per HPI, all others negative.  Physical Exam: Vital signs in last 24 hours: Temp:  [98.4 F (36.9 C)-98.8 F (37.1 C)] 98.7 F (37.1 C) (03/20 0813) Pulse Rate:  [51-69] 69 (03/20 0813) Resp:  [16-18] 18 (03/20 0813) BP: (97-159)/(73-101) 136/99 (03/20 0813) SpO2:  [97 %-100 %] 99 % (03/20 0813) Weight:  [95.3  kg-98.7 kg] 98 kg (03/20 0434) Last BM Date: 04/14/19 General:   Alert,  Well-developed, overweight, pleasant and cooperative in NAD Head:  Normocephalic and atraumatic. Eyes:  Sclera clear, no icterus.   Conjunctiva pink. Ears:  Normal auditory acuity. Nose:  No deformity, discharge,  or lesions. Mouth:  No deformity or lesions.  Oropharynx pink & moist. Neck:  Supple; no masses or thyromegaly. Lungs:  Clear throughout to auscultation.   No wheezes, crackles, or rhonchi. No acute distress. Heart:  Regular rate and rhythm; no murmurs, clicks, rubs,  or gallops. Abdomen:  Soft, protuberant, nontender and nondistended. No masses, hepatosplenomegaly or hernias noted. Normal bowel sounds, without guarding, and without rebound.     Msk:  Symmetrical without gross deformities. Normal posture. Pulses:  Normal pulses noted. Extremities:  Without clubbing or edema. Neurologic:  Alert and  oriented x4;  grossly normal neurologically. Skin:  Intact without significant lesions or rashes. Psych:  Alert and cooperative. Normal mood and affect.   Lab Results: Recent Labs    04/14/19 2324 04/15/19 0330 04/15/19 0711  WBC 7.4 7.1 6.4  HGB 12.7* 10.7* 10.7*  HCT 39.4 32.8* 32.7*  PLT 94* 92* 90*   BMET Recent Labs    04/14/19 1535  NA 138  K 4.1  CL 102  CO2 25  GLUCOSE 119*  BUN 12  CREATININE 1.06  CALCIUM 9.4   LFT Recent Labs    04/14/19 1535  PROT 7.8  ALBUMIN 4.7  AST 27  ALT 9  ALKPHOS 47  BILITOT 0.7   PT/INR No results for input(s): LABPROT, INR in the last 72 hours.  Studies/Results: No results found.  Impression:  1.  GI bleeding.  Both components of melena and hematochezia.  Hemodynamically stable.  Uses NsAIDs.  Overall constellation of findings unclear for upper or lower GI tract source. 2.  Acute blood loss anemia. 3.  Thrombocytopenia.  Plan:  1.  Continue PPI, serial CBCs, blood transfuse if needed, volume repletion. 2.  Plan for endoscopy and  colonoscopy tomorrow. 3.  Would transfuse two units platelets later this evening in anticipation of endoscopic procedures tomorrow. 4.  Case discussed with Dr. Darrick Meigs.   LOS: 0 days   Keary Hanak M  04/15/2019, 9:16 AM  Cell [phone] If no answer or after 5 PM call [phone]

## 2019-04-15 NOTE — H&P (View-Only) (Signed)
[facility] Consultation Note  Referring Provider: [facility] Primary Care Physician:  [name], MD Primary Gastroenterologist:  Dr. Watt Climes  Reason for Consultation:  GI bleeding  HPI: Lonnie Hansen is a 69 y.o. male presenting with GI bleeding.  Yesterday, had explosive episode of black tarry stools.  Had similar episode a couple months ago for which he was put on PPI.  Saw Dr. Watt Climes in February and plan was for patient to have endoscopy and colonoscopy in the near future.  Hgb ~12.5 --> 10.7 over the past 24 hours.  Since his admission, he has had some bright red hematochezia.  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, most recent platelets 90.  States he had colonoscopy and endoscopy for bleeding several years ago, and reports having hemorrhoids and reports having colonic polyps removed.  Reports having stool test (Cologuard?) done recently which was negative.  Takes aspirin, and also takes Goody's regularly for hip pain.   Past Medical History:  Diagnosis Date  . Anemia   . Arthritis   . Asymptomatic bilateral carotid artery stenosis 08/2015   1-39%   . Blood dyscrasia    "trouble with my bloods clotting"  . Bruising    on the skin states due to platelets or sometimes low or high  . Cataracts, bilateral   . 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 Coronary artery disease   . Diverticulosis   . Enlarged aorta ([place])   . Enlarged prostate    slightly  . GERD (gastroesophageal reflux disease)    takes Pantoprazole daily as needed  . Glaucoma    uses eye drops daily  . Headache   . History of colon polyps    benign  . History of kidney stones   . Hyperlipidemia    no on any meds  . Hypertension    takes Amlodipine and Atenolol daily  . Joint pain   . Joint swelling   . Nocturia   . OSA on CPAP   . Sleep apnea   . Vocal cord nodule    pt. states  it's a" growth on vocal cord"    Past Surgical History:   Procedure Laterality Date  . AORTIC ARCH ANGIOGRAPHY N/A 04/16/2016   Procedure: Aortic Arch Angiography;  Surgeon: [name] Martinique, MD;  Location: [place] [facility];  Service: Cardiovascular;  Laterality: N/A;  . AORTIC VALVE REPLACEMENT N/A 09/15/2016   Procedure: AORTIC VALVE REPLACEMENT (AVR);  Surgeon: Grace Isaac, MD;  Location: [place];  Service: Open Heart Surgery;  Laterality: N/A;  Using 69mm Edwards Perimount Magna Ease Aortic Bioprosthesis Valve  . ASCENDING AORTIC ROOT REPLACEMENT N/A 09/15/2016   Procedure: ASCENDING AORTIC ROOT REPLACEMENT;  Surgeon: Grace Isaac, MD;  Location: [place];  Service: Open Heart Surgery;  Laterality: N/A;  Using 74mm Gelweave Valsalva Graft  . COLONOSCOPY    . COLONOSCOPY WITH ESOPHAGOGASTRODUODENOSCOPY (EGD)    . IR THORACENTESIS ASP PLEURAL SPACE W/IMG GUIDE  10/23/2016  . MICROLARYNGOSCOPY Right 09/20/2017   Procedure: MICROLARYNGOSCOPY WITH  EXCISION OF VOCAL CORD LESION;  Surgeon: Leta Baptist, MD;  Location: [place];  Service: ENT;  Laterality: Right;  . MULTIPLE EXTRACTIONS WITH ALVEOLOPLASTY N/A 06/10/2016   Procedure: Extraction of tooth #'s 2,8,13,15, and 29  with alveoloplasty, maxillary right and left buccal exostoses reductions, and gross debridement of remaining teeth.;  Surgeon: Lenn Cal, DDS;  Location: [place];  Service: Oral Surgery;  Laterality: N/A;  .  RIGHT/LEFT HEART CATH AND CORONARY ANGIOGRAPHY N/A 04/16/2016   Procedure: Right/Left Heart Cath and Coronary Angiography;  Surgeon: [name] Martinique, MD;  Location: [place] [facility];  Service: Cardiovascular;  Laterality: N/A;  . TEE WITHOUT CARDIOVERSION N/A 09/15/2016   Procedure: TRANSESOPHAGEAL ECHOCARDIOGRAM (TEE);  Surgeon: Grace Isaac, MD;  Location: [place];  Service: Open Heart Surgery;  Laterality: N/A;    Prior to Admission medications   Medication Sig Start Date End Date Taking? Authorizing Provider  amLODipine (NORVASC) 10 MG tablet Take  10 mg by mouth daily.   Yes [provider]  aspirin EC 81 MG EC tablet Take 1 tablet (81 mg total) by mouth daily. 09/24/16  Yes Gold, Wayne E, PA-C  atenolol (TENORMIN) 25 MG tablet Take 0.5 tablets (12.5 mg total) by mouth 2 (two) times daily. 09/23/16  Yes Gold, Wayne E, PA-C  latanoprost (XALATAN) 0.005 % ophthalmic solution Place 1 drop into both eyes at bedtime.   Yes [provider]  Multiple Vitamin (MULTIVITAMIN WITH MINERALS) TABS tablet Take 1 tablet by mouth daily.   Yes [provider]  rosuvastatin (CRESTOR) 10 MG tablet Take 1 tablet by mouth once daily 01/10/19  Yes Martinique, [name], MD  tamsulosin (FLOMAX) 0.4 MG CAPS capsule Take 0.4 mg by mouth daily. 01/25/18  Yes [provider]    Current Facility-Administered Medications  Medication Dose Route Frequency Provider Last Rate Last Admin  . acetaminophen (TYLENOL) tablet 650 mg  650 mg Oral Q6H PRN Rise Patience, MD       Or  . acetaminophen (TYLENOL) suppository 650 mg  650 mg Rectal Q6H PRN Rise Patience, MD      . labetalol (NORMODYNE) injection 10 mg  10 mg Intravenous Q2H PRN Rise Patience, MD      . latanoprost (XALATAN) 0.005 % ophthalmic solution 1 drop  1 drop Both Eyes QHS Rise Patience, MD   1 drop at 04/15/19 0110  . ondansetron (ZOFRAN) tablet 4 mg  4 mg Oral Q6H PRN Rise Patience, MD       Or  . ondansetron [facility]) injection 4 mg  4 mg Intravenous Q6H PRN Rise Patience, MD      . pantoprazole (PROTONIX) 80 mg in sodium chloride 0.9 % 100 mL (0.8 mg/mL) infusion  8 mg/hr Intravenous Continuous Rise Patience, MD 10 mL/hr at 04/15/19 0109 8 mg/hr at 04/15/19 0109  . [START ON 04/18/2019] pantoprazole (PROTONIX) injection 40 mg  40 mg Intravenous Q12H Rise Patience, MD        Allergies as of 04/14/2019  . (No Known Allergies)    Family History  Problem Relation Age of Onset  . Heart disease Father   . Heart attack Father    . Thyroid disease Sister     Social History   Socioeconomic History  . Marital status: Married    Spouse name: Not on file  . Number of children: 1  . Years of education: Not on file  . Highest education level: Not on file  Occupational History  . Occupation: Musician  Tobacco Use  . Smoking status: Former Smoker    Packs/day: 1.00    Years: 25.00    Pack years: 25.00    Types: Cigarettes  . Smokeless tobacco: Never Used  Substance and Sexual Activity  . Alcohol use: No    Alcohol/week: 0.0 standard drinks  . Drug use: No  . Sexual activity: Not on  file  Other Topics Concern  . 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   Social Determinants of Health   Financial Resource Strain:   . Difficulty of Paying Living Expenses:   Food Insecurity:   . Worried About Charity fundraiser in the Last Year:   . Arboriculturist in the Last Year:   Transportation Needs:   . Film/video editor (Medical):   Marland Kitchen Lack of Transportation (Non-Medical):   Physical Activity:   . Days of Exercise per Week:   . Minutes of Exercise per Session:   Stress:   . Feeling of Stress :   Social Connections:   . Frequency of Communication with Friends and Family:   . Frequency of Social Gatherings with Friends and Family:   . Attends Religious Services:   . Active Member of Clubs or Organizations:   . Attends Archivist Meetings:   Marland Kitchen Marital Status:   Intimate Partner Violence:   . Fear of Current or Ex-Partner:   . Emotionally Abused:   Marland Kitchen Physically Abused:   . Sexually Abused:     Review of Systems: As per HPI, all others negative.  Physical Exam: Vital signs in last 24 hours: Temp:  [98.4 F (36.9 C)-98.8 F (37.1 C)] 98.7 F (37.1 C) (03/20 0813) Pulse Rate:  [51-69] 69 (03/20 0813) Resp:  [16-18] 18 (03/20 0813) BP: (97-159)/(73-101) 136/99 (03/20 0813) SpO2:  [97 %-100 %] 99 % (03/20 0813) Weight:  [95.3  kg-98.7 kg] 98 kg (03/20 0434) Last BM Date: 04/14/19 General:   Alert,  Well-developed, overweight, pleasant and cooperative in NAD Head:  Normocephalic and atraumatic. Eyes:  Sclera clear, no icterus.   Conjunctiva pink. Ears:  Normal auditory acuity. Nose:  No deformity, discharge,  or lesions. Mouth:  No deformity or lesions.  Oropharynx pink & moist. Neck:  Supple; no masses or thyromegaly. Lungs:  Clear throughout to auscultation.   No wheezes, crackles, or rhonchi. No acute distress. Heart:  Regular rate and rhythm; no murmurs, clicks, rubs,  or gallops. Abdomen:  Soft, protuberant, nontender and nondistended. No masses, hepatosplenomegaly or hernias noted. Normal bowel sounds, without guarding, and without rebound.     Msk:  Symmetrical without gross deformities. Normal posture. Pulses:  Normal pulses noted. Extremities:  Without clubbing or edema. Neurologic:  Alert and  oriented x4;  grossly normal neurologically. Skin:  Intact without significant lesions or rashes. Psych:  Alert and cooperative. Normal mood and affect.   Lab Results: Recent Labs    04/14/19 2324 04/15/19 0330 04/15/19 0711  WBC 7.4 7.1 6.4  HGB 12.7* 10.7* 10.7*  HCT 39.4 32.8* 32.7*  PLT 94* 92* 90*   BMET Recent Labs    04/14/19 1535  NA 138  K 4.1  CL 102  CO2 25  GLUCOSE 119*  BUN 12  CREATININE 1.06  CALCIUM 9.4   LFT Recent Labs    04/14/19 1535  PROT 7.8  ALBUMIN 4.7  AST 27  ALT 9  ALKPHOS 47  BILITOT 0.7   PT/INR No results for input(s): LABPROT, INR in the last 72 hours.  Studies/Results: No results found.  Impression:  1.  GI bleeding.  Both components of melena and hematochezia.  Hemodynamically stable.  Uses NsAIDs.  Overall constellation of findings unclear for upper or lower GI tract source. 2.  Acute blood loss anemia. 3.  Thrombocytopenia.  Plan:  1.  Continue PPI, serial CBCs, blood transfuse if needed, volume repletion. 2.  Plan for endoscopy and  colonoscopy tomorrow. 3.  Would transfuse two units platelets later this evening in anticipation of endoscopic procedures tomorrow. 4.  Case discussed with Dr. Darrick Meigs.   LOS: 0 days   Shamell Suarez M  04/15/2019, 9:16 AM  Cell [phone] If no answer or after 5 PM call [phone]

## 2019-04-15 NOTE — Progress Notes (Addendum)
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  Patient states that he had bleeding 3 months ago at that time PCP referred him to gastroenterology.  [name] plan to have endoscopy and colonoscopy next week but patient started having black stool yesterday so he came to ED.  He admits to having taking Goody powder for right hip pain from osteoarthritis.  He does have a history of ITP and is followed by [name] as outpatient.   Subjective   Patient seen and examined, denies abdominal pain.  No nausea or vomiting.   Assessment/Plan:     1. GI bleed-patient has both black tarry stool as well as fresh blood in the stool.  He will need both EGD and colonoscopy.  GI consult appreciated.  Continue IV Protonix.  Plan for EGD and colonoscopy in a.m. 2. Acute blood loss anemia-hemoglobin dropped from 12.9-10.7.  Secondary to above.  Follow CBC in a.m. and transfuse for hemoglobin less than 7. 3. Chronic ITP-today patient's platelet count is 90,000.  Patient is followed by [name] as outpatient.  Discussed with [name], he recommends giving 2 units of platelets tonight in anticipation for EGD and colonoscopy in a.m.  No signs of bleeding noted at this time.  Will transfuse 2 units platelets tonight. 4. Hypertension-blood pressure is stable.  Continue as needed IV labetalol. 5. History of atrial grafting and aortic valve replacement with tissue valve-no active issues noted at this time.    SpO2: 99 %   COVID-19 Labs  No results for input(s): DDIMER, FERRITIN, LDH, CRP in the last 72 hours.  Lab Results  Component Value Date   [place] NEGATIVE 04/14/2019      CBC: Recent Labs  Lab 04/14/19 1535  04/14/19 2324 04/15/19 0330 04/15/19 0711  WBC 6.0 7.4 7.1 6.4  HGB 12.9* 12.7* 10.7* 10.7*  HCT 39.9 39.4 32.8* 32.7*  MCV 92.4 91.0 90.1 90.6  PLT 108* 94* 92* 90*    Basic Metabolic Panel: Recent Labs  Lab 04/14/19 1535  NA 138  K 4.1  CL 102  CO2 25  GLUCOSE 119*  BUN 12  CREATININE 1.06  CALCIUM 9.4     Liver Function Tests: Recent Labs  Lab 04/14/19 1535  AST 27  ALT 9  ALKPHOS 3  BILITOT 0.7  PROT 7.8  ALBUMIN 4.7        DVT prophylaxis: SCDs  Code Status: Full code  Family Communication: No family at bedside  Disposition Plan: Patient admitted with black tarry stool.  Barrier to discharge-patient will need EGD and colonoscopy as per GI.      Scheduled medications:  . bisacodyl  10 mg Rectal Once  . latanoprost  1 drop Both Eyes QHS  . [START ON 04/18/2019] pantoprazole  40 mg Intravenous Q12H  . polyethylene glycol-electrolytes  4,000 mL Oral Once    Consultants:  GI  Procedures:    Antibiotics:   Anti-infectives (From admission, onward)   None       Objective   Vitals:   04/14/19 2316 04/14/19 2326 04/15/19 0434 04/15/19 0813  BP: (!) 157/95  97/73 (!) 136/99  Pulse: (!) 58  65 69  Resp: 16  18 18   Temp: 98.4 F (36.9  C)  98.8 F (37.1 C) 98.7 F (37.1 C)  TempSrc: Oral  Oral Oral  SpO2: 100% 100% 100% 99%  Weight: 98.7 kg  98 kg   Height: 5\' 9"  (1.753 m)       Intake/Output Summary (Last 24 hours) at 04/15/2019 1411 Last data filed at 04/15/2019 7616 Gross per 24 hour  Intake 118.26 ml  Output 600 ml  Net -481.74 ml    03/18 1901 - 03/20 0700 In: 118.3 [I.V.:18.3] Out: -   Filed Weights   04/14/19 1523 04/14/19 2316 04/15/19 0434  Weight: 95.3 kg 98.7 kg 98 kg    Physical Examination:    General-appears in no acute distress  Heart-S1-S2, regular, no murmur auscultated  Lungs-clear to auscultation bilaterally, no wheezing or crackles auscultated  Abdomen-soft, nontender, no  organomegaly  Extremities-no edema in the lower extremities  Neuro-alert, oriented x3, no focal deficit noted    Data Reviewed:   Recent Results (from the past 240 hour(s))  SARS CORONAVIRUS 2 (TAT 6-24 HRS) Nasopharyngeal Nasopharyngeal Swab     Status: None   Collection Time: 04/14/19  9:54 PM   Specimen: Nasopharyngeal Swab  Result Value Ref Range Status   SARS Coronavirus 2 NEGATIVE NEGATIVE Final    Comment: (NOTE) SARS-CoV-2 target nucleic acids are NOT DETECTED. The SARS-CoV-2 RNA is generally detectable in upper and lower respiratory specimens during the acute phase of infection. Negative results do not preclude SARS-CoV-2 infection, do not rule out co-infections with other pathogens, and should not be used as the sole basis for treatment or other patient management decisions. Negative results must be combined with clinical observations, patient history, and epidemiological information. The expected result is Negative. Fact Sheet for Patients: SugarRoll.be Fact Sheet for Healthcare Providers: https://www.woods-mathews.com/ This test is not yet approved or cleared by the [place] FDA and  has been authorized for detection and/or diagnosis of SARS-CoV-2 by FDA under an Emergency Use Authorization (EUA). This EUA will remain  in effect (meaning this test can be used) for the duration of the COVID-19 declaration under Section 56 4(b)(1) of the Act, 21 U.S.C. section 360bbb-3(b)(1), unless the authorization is terminated or revoked sooner. Performed at [facility], [place] [address] [place],  [address]     No results for input(s): LIPASE, AMYLASE in the last 168 hours. No results for input(s): AMMONIA in the last 168 hours.   Studies:  No results found.   Admission status: The appropriate admission status for this patient is INPATIENT. Inpatient status is judged to be reasonable and necessary in order to  provide the required intensity of service to ensure the patient's safety. The patient's presenting symptoms, physical exam findings, and initial radiographic and laboratory data in the context of their chronic comorbidities is felt to place them at high risk for further clinical deterioration. Furthermore, it is not anticipated that the patient will be medically stable for discharge from the hospital within 2 midnights of admission. The following factors support the admission status of inpatient.    The patient's presenting symptoms include hematochezia, black tarry stool. The worrisome physical exam findings include none. The initial radiographic and laboratory data are worrisome because of GI bleed. The chronic co-morbidities include ITP.    * I certify that at the point of admission it is my clinical judgment that the patient will require inpatient hospital care spanning beyond 2 midnights from the point of admission due to high intensity of service, high risk for further deterioration and  high frequency of surveillance required.Oswald Hillock   [facility] If 7PM-7AM, please contact night-coverage at [email or web address], Office  [phone]   04/15/2019, 2:11 PM  LOS: 0 days

## 2019-04-15 NOTE — Plan of Care (Signed)
  Problem: Education: Goal: Knowledge of General Education information will improve Description: Including pain rating scale, medication(s)/side effects and non-pharmacologic comfort measures Outcome: Progressing  Patient verbalizes understanding of rationale for egd/colon, prep explained.  Problem: Activity: Goal: Risk for activity intolerance will decrease Outcome: Progressing  Pt states lightheadness after getting up to bathroom, verbalizes undestanding to call staff before getting up.  Problem: Pain Managment: Goal: General experience of comfort will improve Outcome: Progressing  Patient denies abd pain.  Problem: Safety: Goal: Ability to remain free from injury will improve Outcome: Progressing Pt calls staff before getting up.

## 2019-04-16 ENCOUNTER — Inpatient Hospital Stay (HOSPITAL_COMMUNITY): Payer: Medicare Other | Admitting: Anesthesiology

## 2019-04-16 ENCOUNTER — Encounter (HOSPITAL_COMMUNITY)
Admission: EM | Disposition: A | Discharge status: Home / Self Care | Payer: Self-pay | Source: Home / Self Care | Attending: Family Medicine

## 2019-04-16 DIAGNOSIS — Z952 Presence of prosthetic heart valve: Secondary | ICD-10-CM

## 2019-04-16 DIAGNOSIS — I10 Essential (primary) hypertension: Secondary | ICD-10-CM

## 2019-04-16 HISTORY — PX: ESOPHAGOGASTRODUODENOSCOPY (EGD) WITH PROPOFOL: SHX5813

## 2019-04-16 HISTORY — PX: COLONOSCOPY: SHX5424

## 2019-04-16 LAB — BPAM PLATELET PHERESIS
Blood Product Expiration Date: 202103212359
Blood Product Expiration Date: 202103212359
ISSUE DATE / TIME: 202103201609
ISSUE DATE / TIME: 202103201841
Unit Type and Rh: 5100
Unit Type and Rh: 8400

## 2019-04-16 LAB — PREPARE PLATELET PHERESIS
Unit division: 0
Unit division: 0

## 2019-04-16 LAB — BASIC METABOLIC PANEL
Anion gap: 10 (ref 5–15)
BUN: 12 mg/dL (ref 8–23)
CO2: 24 mmol/L (ref 22–32)
Calcium: 8.5 mg/dL — ABNORMAL LOW (ref 8.9–10.3)
Chloride: 106 mmol/L (ref 98–111)
Creatinine, Ser: 0.96 mg/dL (ref 0.61–1.24)
GFR calc Af Amer: >60 mL/min (ref >60)
GFR calc non Af Amer: >60 mL/min (ref >60)
Glucose, Bld: 106 mg/dL — ABNORMAL HIGH (ref 70–99)
Potassium: 3.6 mmol/L (ref 3.5–5.1)
Sodium: 140 mmol/L (ref 135–145)

## 2019-04-16 LAB — CBC
HCT: 29 % — ABNORMAL LOW (ref 39.0–52.0)
Hemoglobin: 9.3 g/dL — ABNORMAL LOW (ref 13.0–17.0)
MCH: 29.2 pg (ref 26.0–34.0)
MCHC: 32.1 g/dL (ref 30.0–36.0)
MCV: 90.9 fL (ref 80.0–100.0)
Platelets: 134 10*3/uL — ABNORMAL LOW (ref 150–400)
RBC: 3.19 MIL/uL — ABNORMAL LOW (ref 4.22–5.81)
RDW: 13.6 % (ref 11.5–15.5)
WBC: 5.8 10*3/uL (ref 4.0–10.5)
nRBC: 0 % (ref 0.0–0.2)

## 2019-04-16 SURGERY — ESOPHAGOGASTRODUODENOSCOPY (EGD) WITH PROPOFOL
Anesthesia: Monitor Anesthesia Care | Laterality: Left

## 2019-04-16 MED ORDER — SODIUM CHLORIDE 0.9 % IV SOLN: INTRAVENOUS | Status: DC

## 2019-04-16 MED ORDER — PHENYLEPHRINE HCL (PRESSORS) 10 MG/ML IV SOLN
INTRAVENOUS | Status: DC | PRN
  Administered 2019-04-16: 80 ug via INTRAVENOUS
  Administered 2019-04-16: 40 ug via INTRAVENOUS

## 2019-04-16 MED ORDER — LACTATED RINGERS IV SOLN
INTRAVENOUS | Status: DC | PRN
  Administered 2019-04-16: 1000 mL via INTRAVENOUS

## 2019-04-16 MED ORDER — LIDOCAINE HCL (CARDIAC) PF 100 MG/5ML IV SOSY
PREFILLED_SYRINGE | INTRAVENOUS | Status: DC | PRN
  Administered 2019-04-16 (×2): 20 mg via INTRATRACHEAL

## 2019-04-16 MED ORDER — PROPOFOL 500 MG/50ML IV EMUL
INTRAVENOUS | Status: DC | PRN
  Administered 2019-04-16: 50 ug/kg/min via INTRAVENOUS

## 2019-04-16 SURGICAL SUPPLY — 15 items

## 2019-04-16 NOTE — Anesthesia Procedure Notes (Signed)
Procedure Name: MAC Date/Time: 04/16/2019 11:09 AM Performed by: Lavell Luster, CRNA Pre-anesthesia Checklist: Patient identified, Emergency Drugs available, Suction available, Patient being monitored and Timeout performed Patient Re-evaluated:Patient Re-evaluated prior to induction Oxygen Delivery Method: Nasal cannula Preoxygenation: Pre-oxygenation with 100% oxygen Induction Type: IV induction Placement Confirmation: breath sounds checked- equal and bilateral and positive ETCO2 Dental Injury: Teeth and Oropharynx as per pre-operative assessment

## 2019-04-16 NOTE — Op Note (Signed)
[facility] Patient Name: Lonnie Hansen Procedure Date : 04/16/2019 MRN: [id number] Attending MD: Arta Silence , MD Date of Birth: 08/17/1950 CSN: [id number] Age: 69 Admit Type: Inpatient Procedure:                Colonoscopy Indications:              Hematochezia, Melena, Acute post hemorrhagic anemia Providers:                Arta Silence, MD, Vista Lawman, RN, Cherylynn Ridges, Technician Referring MD:             [facility] Medicines:                Monitored Anesthesia Care Complications:            No immediate complications. Estimated Blood Loss:     Estimated blood loss was minimal. Procedure:                Pre-Anesthesia Assessment:                           - Prior to the procedure, a History and Physical                            was performed, and patient medications and                            allergies were reviewed. The patient's tolerance of                            previous anesthesia was also reviewed. The risks                            and benefits of the procedure and the sedation                            options and risks were discussed with the patient.                            All questions were answered, and informed consent                            was obtained. Prior Anticoagulants: The patient has                            taken no previous anticoagulant or antiplatelet                            agents. ASA Grade Assessment: III - A patient with                            severe systemic disease. After reviewing the risks  and benefits, the patient was deemed in                            satisfactory condition to undergo the procedure.                           - Prior to the procedure, a History and Physical                            was performed, and patient medications and                            allergies were reviewed. The patient's tolerance of                   previous anesthesia was also reviewed. The risks                            and benefits of the procedure and the sedation                            options and risks were discussed with the patient.                            All questions were answered, and informed consent                            was obtained. Prior Anticoagulants: The patient has                            taken no previous anticoagulant or antiplatelet                            agents. ASA Grade Assessment: III - A patient with                            severe systemic disease. After reviewing the risks                            and benefits, the patient was deemed in                            satisfactory condition to undergo the procedure.                           After obtaining informed consent, the colonoscope                            was passed under direct vision. Throughout the                            procedure, the patient's blood pressure, pulse, and  oxygen saturations were monitored continuously. The                            PCF-H190DL ([id number]) Olympus pediatric colonoscope                            was introduced through the anus and advanced to the                            the cecum, identified by appendiceal orifice and                            ileocecal valve. The ileocecal valve, appendiceal                            orifice, and rectum were photographed. The entire                            colon was examined. The colonoscopy was performed                            without difficulty. The patient tolerated the                            procedure well. The quality of the bowel                            preparation was fair. Scope In: 11:05:42 AM Scope Out: 11:20:35 AM Scope Withdrawal Time: 0 hours 9 minutes 38 seconds  Total Procedure Duration: 0 hours 14 minutes 53 seconds  Findings:      Hemorrhoids were found on perianal exam.       Internal hemorrhoids were found during retroflexion. The hemorrhoids       were mild.      No additional abnormalities were found on retroflexion.      Lots of old and some relatively fresh maroon blood seen throughout the       colon.      Multiple small and large-mouthed diverticula were found in the sigmoid       colon, descending colon, transverse colon and ascending colon.      Bowel prep diffusely fair; semisolid and viscous stool obscured some       views throughout colon; diminutive or subtle sessile polyps could easily       have been missed.      Colon otherwise normal; no other polyps, masses, vascular ectasias, or       inflammatory changes were seen. No focal source of bleeding identified. Impression:               - Preparation of the colon was fair.                           - Hemorrhoids found on perianal exam.                           - Internal hemorrhoids.                           -  Diverticulosis in the sigmoid colon, in the                            descending colon, in the transverse colon and in                            the ascending colon.                           - The examination was otherwise normal.                           - Suspect bleeding from diverticulosis. Moderate Sedation:      None Recommendation:           - Return patient to hospital ward for ongoing care.                           - Clear liquid diet today.                           - If bleeding persists, then would do tagged RBC as                            next step in management.                           - Continue present medications.                           [name] will follow. Procedure Code(s):        --- Professional ---                           [id number], Colonoscopy, flexible; diagnostic, including                            collection of specimen(s) by brushing or washing,                            when performed (separate procedure) Diagnosis Code(s):        ---  Professional ---                           K64.8, Other hemorrhoids                           K92.1, Melena (includes Hematochezia)                           D62, Acute posthemorrhagic anemia                           K57.30, Diverticulosis of large intestine without                            perforation or abscess without bleeding CPT copyright 2019 American  Medical Association. All rights reserved. The codes documented in this report are preliminary and upon coder review may  be revised to meet current compliance requirements. Arta Silence, MD 04/16/2019 11:34:25 AM This report has been signed electronically. Number of Addenda: 0

## 2019-04-16 NOTE — Interval H&P Note (Signed)
History and Physical Interval Note:  04/16/2019 10:38 AM  Lonnie Hansen  has presented today for surgery, with the diagnosis of blood in stool, anemia.  The various methods of treatment have been discussed with the patient and family. After consideration of risks, benefits and other options for treatment, the patient has consented to  Procedure(s): ESOPHAGOGASTRODUODENOSCOPY (EGD) WITH PROPOFOL (Left) COLONOSCOPY (Left) as a surgical intervention.  The patient's history has been reviewed, patient examined, no change in status, stable for surgery.  I have reviewed the patient's chart and labs.  Questions were answered to the patient's satisfaction.     Landry Dyke

## 2019-04-16 NOTE — Anesthesia Postprocedure Evaluation (Signed)
Anesthesia Post Note  Patient: SIDDIQ KALUZNY  Procedure(s) Performed: ESOPHAGOGASTRODUODENOSCOPY (EGD) WITH PROPOFOL (Left ) COLONOSCOPY (Left )     Patient location during evaluation: PACU Anesthesia Type: MAC Level of consciousness: awake and alert Pain management: pain level controlled Vital Signs Assessment: post-procedure vital signs reviewed and stable Respiratory status: spontaneous breathing, nonlabored ventilation, respiratory function stable and patient connected to nasal cannula oxygen Cardiovascular status: stable and blood pressure returned to baseline Postop Assessment: no apparent nausea or vomiting Anesthetic complications: no    Last Vitals:  Vitals:   04/16/19 1136 04/16/19 1146  BP: 131/81 139/85  Pulse: 69 67  Resp: 15 13  Temp:    SpO2: 98% 100%    Last Pain:  Vitals:   04/16/19 1146  TempSrc:   PainSc: 0-No pain                 Tiajuana Amass

## 2019-04-16 NOTE — Anesthesia Preprocedure Evaluation (Addendum)
Anesthesia Evaluation  Patient identified by MRN, date of birth, ID band Patient awake    Reviewed: Allergy & Precautions, H&P , NPO status , Patient's Chart, lab work & pertinent test results  Airway Mallampati: II  TM Distance: >3 FB Neck ROM: Full    Dental  (+) Dental Advisory Given, Partial Upper, Partial Lower, Missing,    Pulmonary neg pulmonary ROS, sleep apnea , former smoker,    breath sounds clear to auscultation       Cardiovascular Exercise Tolerance: Good hypertension, Pt. on medications and Pt. on home beta blockers + CAD  negative cardio ROS  + Valvular Problems/Murmurs AI  Rhythm:Regular Rate:Normal  S/p AVR   Neuro/Psych  Headaches, negative neurological ROS  negative psych ROS   GI/Hepatic negative GI ROS, Neg liver ROS, GERD  Medicated,  Endo/Other  negative endocrine ROS  Renal/GU negative Renal ROS  negative genitourinary   Musculoskeletal  (+) Arthritis , Osteoarthritis,    Abdominal   Peds  Hematology  (+) anemia ,   Anesthesia Other Findings   Reproductive/Obstetrics negative OB ROS                             Anesthesia Physical  Anesthesia Plan  ASA: III  Anesthesia Plan: MAC   Post-op Pain Management:    Induction: Intravenous  PONV Risk Score and Plan: 2 and Treatment may vary due to age or medical condition, Ondansetron and Propofol infusion  Airway Management Planned: Natural Airway and Nasal Cannula  Additional Equipment:   Intra-op Plan:   Post-operative Plan:   Informed Consent: I have reviewed the patients History and Physical, chart, labs and discussed the procedure including the risks, benefits and alternatives for the proposed anesthesia with the patient or authorized representative who has indicated his/her understanding and acceptance.       Plan Discussed with: CRNA  Anesthesia Plan Comments:         Anesthesia Quick  Evaluation

## 2019-04-16 NOTE — Progress Notes (Signed)
Pt refuses to finish the Nulytely prep. Will relay to day team

## 2019-04-16 NOTE — Op Note (Signed)
[facility] Patient Name: Lonnie Hansen Procedure Date : 04/16/2019 MRN: [id number] Attending MD: Arta Silence , MD Date of Birth: May 19, 1950 CSN: [id number] Age: 69 Admit Type: Inpatient Procedure:                Upper GI endoscopy Indications:              Acute post hemorrhagic anemia, Hematochezia, Melena Providers:                Arta Silence, MD, Vista Lawman, RN, Cherylynn Ridges, Technician Referring MD:              Medicines:                Monitored Anesthesia Care Complications:            No immediate complications. Estimated Blood Loss:     Estimated blood loss: none. Procedure:                Pre-Anesthesia Assessment:                           - Prior to the procedure, a History and Physical                            was performed, and patient medications and                            allergies were reviewed. The patient's tolerance of                            previous anesthesia was also reviewed. The risks                            and benefits of the procedure and the sedation                            options and risks were discussed with the patient.                            All questions were answered, and informed consent                            was obtained. Prior Anticoagulants: The patient has                            taken no previous anticoagulant or antiplatelet                            agents. ASA Grade Assessment: III - A patient with                            severe systemic disease. After reviewing the risks  and benefits, the patient was deemed in                            satisfactory condition to undergo the procedure.                           After obtaining informed consent, the endoscope was                            passed under direct vision. Throughout the                            procedure, the patient's blood pressure, pulse, and     oxygen saturations were monitored continuously. The                            [id number]-[id number] ([id number]) Olympus gastroscope was                            introduced through the mouth, and advanced to the                            second part of duodenum. The upper GI endoscopy was                            accomplished without difficulty. The patient                            tolerated the procedure well. Scope In: Scope Out: Findings:      The examined esophagus was normal.      The entire examined stomach was normal.      A large non-bleeding diverticulum was found in the first portion of the       duodenum, proximal to the expected location of the ampulla.      Patchy mildly erythematous mucosa was found in the duodenal bulb and in       the first portion of the duodenum.      Duodenum otherwise normal to the second portion.      No old or fresh blood was seen to the extent of our examination. Impression:               - Normal esophagus.                           - Normal stomach.                           - Non-bleeding duodenal diverticulum.                           - Erythematous duodenopathy.                           - Otherwise normal endoscopy.                           - No explanation for bleeding  and acute blood loss                            anemia was identified on today's endoscopy. Moderate Sedation:      None Recommendation:           - Perform a colonoscopy today. Procedure Code(s):        --- Professional ---                           [phone], Esophagogastroduodenoscopy, flexible,                            transoral; diagnostic, including collection of                            specimen(s) by brushing or washing, when performed                            (separate procedure) Diagnosis Code(s):        --- Professional ---                           K31.89, Other diseases of stomach and duodenum                           D62, Acute posthemorrhagic anemia                            K92.1, Melena (includes Hematochezia)                           K57.10, Diverticulosis of small intestine without                            perforation or abscess without bleeding CPT copyright 2019 American Medical Association. All rights reserved. The codes documented in this report are preliminary and upon coder review may  be revised to meet current compliance requirements. Arta Silence, MD 04/16/2019 11:31:09 AM This report has been signed electronically. Number of Addenda: 0

## 2019-04-16 NOTE — Progress Notes (Signed)
[facility]  PROGRESS NOTE  Lonnie Hansen SHF:[id number] DOB: [birth year] DOA: 04/14/2019 PCP: [name], MD   Brief HPI:   69 year old male with a history of aortic root grafting with aortic valve replacement with tissue valve, CAD, hypertension, chronic ITP came to ED with complaints of rectal bleeding.  Patient states that he had bleeding 3 months ago at that time PCP referred him to gastroenterology.  [name] plan to have endoscopy and colonoscopy next week but patient started having black stool yesterday so he came to ED.  He admits to having taking Goody powder for right hip pain from osteoarthritis.  He does have a history of ITP and is followed by [name] as outpatient.   Subjective   Patient seen and examined, denies abdominal pain.  Plan for EGD and colonoscopy today.   Assessment/Plan:     1. GI bleed-patient has both black tarry stool as well as fresh blood in the stool.  He will need both EGD and colonoscopy.  GI consult appreciated.  Continue IV Protonix.  Plan for EGD and colonoscopy today. 2. Acute blood loss anemia-hemoglobin dropped from 12.9-9.3.  Secondary to above.  Follow CBC in a.m. and transfuse for hemoglobin less than 7. 3. Chronic ITP-today patient's platelet count is 90,000.  Patient is followed by [name] as outpatient.  Discussed with [name], he recommends giving 2 units of platelets tonight in anticipation for EGD and colonoscopy in a.m.  No signs of bleeding noted at this time.  Platelet count is 130,000, s/p 2 units of platelets yesterday. 4. Hypertension-blood pressure is stable.  Continue as needed IV labetalol. 5. History of atrial grafting and aortic valve replacement with tissue valve-no active issues noted at this time.    SpO2: 100 %   COVID-19 Labs  No results for input(s): DDIMER, FERRITIN, LDH, CRP in the last 72 hours.  Lab Results  Component Value Date   [place] NEGATIVE 04/14/2019      CBC: Recent  Labs  Lab 04/14/19 1535 04/14/19 2324 04/15/19 0330 04/15/19 0711 04/16/19 0602  WBC 6.0 7.4 7.1 6.4 5.8  HGB 12.9* 12.7* 10.7* 10.7* 9.3*  HCT 39.9 39.4 32.8* 32.7* 29.0*  MCV 92.4 91.0 90.1 90.6 90.9  PLT 108* 94* 92* 90* 134*    Basic Metabolic Panel: Recent Labs  Lab 04/14/19 1535 04/16/19 0602  NA 138 140  K 4.1 3.6  CL 102 106  CO2 25 24  GLUCOSE 119* 106*  BUN 12 12  CREATININE 1.06 0.96  CALCIUM 9.4 8.5*     Liver Function Tests: Recent Labs  Lab 04/14/19 1535  AST 27  ALT 9  ALKPHOS 55  BILITOT 0.7  PROT 7.8  ALBUMIN 4.7        DVT prophylaxis: SCDs  Code Status: Full code  Family Communication: No family at bedside  Disposition Plan: Patient admitted with black tarry stool.  Barrier to discharge-patient will need EGD and colonoscopy as per GI.      Scheduled medications:  . latanoprost  1 drop Both Eyes QHS  . [START ON 04/18/2019] pantoprazole  40 mg Intravenous Q12H    Consultants:  GI  Procedures:    Antibiotics:   Anti-infectives (From admission, onward)   None       Objective   Vitals:   04/16/19 1003 04/16/19 1127 04/16/19 1136 04/16/19 1146  BP: 128/78 (!) 108/45 131/81 139/85  Pulse: 78 81 69 67  Resp: 20 18 15 13   Temp:  97.9 F (36.6 C) 97.6 F (36.4 C)    TempSrc: Temporal Oral    SpO2: 97% 100% 98% 100%  Weight: 98.7 kg     Height:        Intake/Output Summary (Last 24 hours) at 04/16/2019 1330 Last data filed at 04/16/2019 1120 Gross per 24 hour  Intake 853.17 ml  Output 400 ml  Net 453.17 ml    03/19 1901 - 03/21 0700 In: 571.4 [I.V.:226.4] Out: 1000 [Urine:1000]  Filed Weights   04/15/19 0434 04/16/19 0527 04/16/19 1003  Weight: 98 kg 98.7 kg 98.7 kg    Physical Examination:   General-appears in no acute distress  Heart-S1-S2, regular, no murmur auscultated  Lungs-clear to auscultation bilaterally, no wheezing or crackles auscultated  Abdomen-soft, nontender, no  organomegaly  Extremities-no edema in the lower extremities  Neuro-alert, oriented x3, no focal deficit noted    Data Reviewed:   Recent Results (from the past 240 hour(s))  SARS CORONAVIRUS 2 (TAT 6-24 HRS) Nasopharyngeal Nasopharyngeal Swab     Status: None   Collection Time: 04/14/19  9:54 PM   Specimen: Nasopharyngeal Swab  Result Value Ref Range Status   SARS Coronavirus 2 NEGATIVE NEGATIVE Final    Comment: (NOTE) SARS-CoV-2 target nucleic acids are NOT DETECTED. The SARS-CoV-2 RNA is generally detectable in upper and lower respiratory specimens during the acute phase of infection. Negative results do not preclude SARS-CoV-2 infection, do not rule out co-infections with other pathogens, and should not be used as the sole basis for treatment or other patient management decisions. Negative results must be combined with clinical observations, patient history, and epidemiological information. The expected result is Negative. Fact Sheet for Patients: SugarRoll.be Fact Sheet for Healthcare Providers: https://www.woods-mathews.com/ This test is not yet approved or cleared by the [place] FDA and  has been authorized for detection and/or diagnosis of SARS-CoV-2 by FDA under an Emergency Use Authorization (EUA). This EUA will remain  in effect (meaning this test can be used) for the duration of the COVID-19 declaration under Section 56 4(b)(1) of the Act, 21 U.S.C. section 360bbb-3(b)(1), unless the authorization is terminated or revoked sooner. Performed at [facility], [place] [address] [place], [place] [address]         Admission status: The appropriate admission status for this patient is INPATIENT. Inpatient status is judged to be reasonable and necessary in order to provide the required intensity of service to ensure the patient's safety. The patient's presenting symptoms, physical exam findings, and initial  radiographic and laboratory data in the context of their chronic comorbidities is felt to place them at high risk for further clinical deterioration. Furthermore, it is not anticipated that the patient will be medically stable for discharge from the hospital within 2 midnights of admission. The following factors support the admission status of inpatient.    The patient's presenting symptoms include hematochezia, black tarry stool. The worrisome physical exam findings include none. The initial radiographic and laboratory data are worrisome because of GI bleed. The chronic co-morbidities include ITP.    * I certify that at the point of admission it is my clinical judgment that the patient will require inpatient hospital care spanning beyond 2 midnights from the point of admission due to high intensity of service, high risk for further deterioration and high frequency of surveillance required.Oswald Hillock   [facility] If 7PM-7AM, please contact night-coverage at [email or web address], Office  [phone]   04/16/2019, 1:30 PM  LOS:  1 day

## 2019-04-16 NOTE — Transfer of Care (Signed)
Immediate Anesthesia Transfer of Care Note  Patient: Lonnie Hansen  Procedure(s) Performed: ESOPHAGOGASTRODUODENOSCOPY (EGD) WITH PROPOFOL (Left ) COLONOSCOPY (Left )  Patient Location: Endoscopy Unit  Anesthesia Type:MAC  Level of Consciousness: sedated  Airway & Oxygen Therapy: Patient connected to nasal cannula oxygen  Post-op Assessment: Post -op Vital signs reviewed and stable  Post vital signs: stable  Last Vitals:  Vitals Value Taken Time  BP    Temp    Pulse    Resp    SpO2      Last Pain:  Vitals:   04/16/19 1003  TempSrc: Temporal  PainSc: 0-No pain         Complications: No apparent anesthesia complications

## 2019-04-16 NOTE — Interval H&P Note (Signed)
History and Physical Interval Note:  04/16/2019 10:48 AM  Lonnie Hansen  has presented today for surgery, with the diagnosis of blood in stool, anemia.  The various methods of treatment have been discussed with the patient and family. After consideration of risks, benefits and other options for treatment, the patient has consented to  Procedure(s): ESOPHAGOGASTRODUODENOSCOPY (EGD) WITH PROPOFOL (Left) COLONOSCOPY (Left) as a surgical intervention.  The patient's history has been reviewed, patient examined, no change in status, stable for surgery.  I have reviewed the patient's chart and labs.  Questions were answered to the patient's satisfaction.     Landry Dyke

## 2019-04-17 ENCOUNTER — Inpatient Hospital Stay (HOSPITAL_COMMUNITY): Payer: Medicare Other

## 2019-04-17 LAB — CBC
HCT: 28.8 % — ABNORMAL LOW (ref 39.0–52.0)
Hemoglobin: 9.3 g/dL — ABNORMAL LOW (ref 13.0–17.0)
MCH: 29.5 pg (ref 26.0–34.0)
MCHC: 32.3 g/dL (ref 30.0–36.0)
MCV: 91.4 fL (ref 80.0–100.0)
Platelets: 120 10*3/uL — ABNORMAL LOW (ref 150–400)
RBC: 3.15 MIL/uL — ABNORMAL LOW (ref 4.22–5.81)
RDW: 13.6 % (ref 11.5–15.5)
WBC: 5.8 10*3/uL (ref 4.0–10.5)
nRBC: 0 % (ref 0.0–0.2)

## 2019-04-17 LAB — BASIC METABOLIC PANEL
Anion gap: 9 (ref 5–15)
BUN: 7 mg/dL — ABNORMAL LOW (ref 8–23)
CO2: 28 mmol/L (ref 22–32)
Calcium: 8.9 mg/dL (ref 8.9–10.3)
Chloride: 104 mmol/L (ref 98–111)
Creatinine, Ser: 1.18 mg/dL (ref 0.61–1.24)
GFR calc Af Amer: >60 mL/min (ref >60)
GFR calc non Af Amer: >60 mL/min (ref >60)
Glucose, Bld: 106 mg/dL — ABNORMAL HIGH (ref 70–99)
Potassium: 3.9 mmol/L (ref 3.5–5.1)
Sodium: 141 mmol/L (ref 135–145)

## 2019-04-17 MED ORDER — TECHNETIUM TC 99M-LABELED RED BLOOD CELLS IV KIT
21.0000 | PACK | Freq: Once | INTRAVENOUS | Status: AC | PRN
  Administered 2019-04-17: 21 via INTRAVENOUS

## 2019-04-17 MED ORDER — PANTOPRAZOLE SODIUM 40 MG PO TBEC
40.0000 mg | DELAYED_RELEASE_TABLET | Freq: Every day | ORAL | Status: DC
  Administered 2019-04-17 – 2019-04-18 (×2): 40 mg via ORAL
  Filled 2019-04-17 (×2): qty 1

## 2019-04-17 MED ORDER — LORAZEPAM 2 MG/ML IJ SOLN
0.5000 mg | Freq: Once | INTRAMUSCULAR | Status: AC
  Administered 2019-04-17: 0.5 mg via INTRAVENOUS
  Filled 2019-04-17: qty 1

## 2019-04-17 NOTE — Discharge Instructions (Signed)
Lower Gastrointestinal Bleeding  Lower gastrointestinal (GI) bleeding is the result of bleeding from the colon, rectum, or anal area. The colon is the last part of the digestive tract, where stool, also called feces, is formed. If you have lower GI bleeding, you may see blood in or on your stool. It may be bright red. Lower GI bleeding often stops without treatment. Continued or heavy bleeding needs emergency treatment at the hospital. What are the causes? Lower GI bleeding may be caused by:  A condition that causes pouches to form in the colon over time (diverticulosis).  Swelling and irritation (inflammation) in areas with diverticulosis (diverticulitis).  Inflammation of the colon (inflammatory bowel disease).  Swollen veins in the rectum (hemorrhoids).  Painful tears in the anus (anal fissures), often caused by passing hard stools.  Cancer of the colon or rectum.  Noncancerous growths (polyps) of the colon or rectum.  A bleeding disorder that impairs the formation of blood clots and causes easy bleeding (coagulopathy).  An abnormal weakening of a blood vessel where an artery and a vein come together (arteriovenous malformation). What increases the risk? You are more likely to develop this condition if:  You are older than 70 years of age.  You take aspirin or NSAIDs on a regular basis.  You take anticoagulant or antiplatelet drugs.  You have a history of high-dose X-ray treatment (radiation therapy) of the colon.  You recently had a colon polyp removed. What are the signs or symptoms? Symptoms of this condition include:  Bright red blood or blood clots coming from your rectum.  Bloody stools.  Black or maroon-colored stools.  Pain or cramping in the abdomen.  Weakness or dizziness.  Racing heartbeat. How is this diagnosed? This condition may be diagnosed based on:  Your symptoms and medical history.  A physical exam. During the exam, your health care  provider will check for signs of blood loss, such as low blood pressure and a rapid pulse.  Tests, such as: ? Flexible sigmoidoscopy. In this procedure, a flexible tube with a camera on the end is used to examine your anus and the first part of your colon to look for the source of bleeding. ? Colonoscopy. This is similar to a flexible sigmoidoscopy, but the camera can extend all the way to the uppermost part of your colon. ? Blood tests to measure your red blood cell count and to check for coagulopathy. ? An imaging study of your colon to look for a bleeding site. In some cases, you may have X-rays taken after a dye or radioactive substance is injected into your bloodstream (angiogram). How is this treated? Treatment for this condition depends on the cause of the bleeding. Heavy or persistent bleeding is treated at the hospital. Treatment may include:  Getting fluids through an IV tube inserted into one of your veins.  Getting blood through an IV tube (blood transfusion).  Stopping bleeding through high-heat coagulation, injections of certain medicines, or applying surgical clips. This can all be done during a colonoscopy.  Having a procedure that involves first doing an angiogram and then blocking blood flow to the bleeding site (embolization).  Stopping some of your regular medicines for a certain amount of time.  Having surgery to remove part of the colon. This may be needed if bleeding is severe and does not respond to other treatment. Follow these instructions at home:  Take over-the-counter and prescription medicines only as told by your health care provider. You may need to   to avoid aspirin, NSAIDs, or other medicines that increase bleeding.  Eat foods that are high in fiber. This will help keep your stools soft. These foods include whole grains, legumes, fruits, and vegetables. Eating 1-3 prunes each day works well for many people.  Drink enough fluid to keep your urine clear or pale  yellow.  Keep all follow-up visits as told by your health care provider. This is important. Contact a health care provider if:  Your symptoms do not improve. Get help right away if:  Your bleeding increases.  You feel light-headed or you faint.  You feel weak.  You have severe cramps in your back or abdomen.  You pass large blood clots in your stool.  Your symptoms get worse. This information is not intended to replace advice given to you by your health care provider. Make sure you discuss any questions you have with your health care provider. Document Revised: 05/06/2018 Document Reviewed: 05/30/2015 Elsevier Patient Education  [place]. Diverticulosis  Diverticulosis is a condition that develops when small pouches (diverticula) form in the wall of the large intestine (colon). The colon is where water is absorbed and stool (feces) is formed. The pouches form when the inside layer of the colon pushes through weak spots in the outer layers of the colon. You may have a few pouches or many of them. The pouches usually do not cause problems unless they become inflamed or infected. When this happens, the condition is called diverticulitis. What are the causes? The cause of this condition is not known. What increases the risk? The following factors may make you more likely to develop this condition:  Being older than age 12. Your risk for this condition increases with age. Diverticulosis is rare among people younger than age 79. By age 28, many people have it.  Eating a low-fiber diet.  Having frequent constipation.  Being overweight.  Not getting enough exercise.  Smoking.  Taking over-the-counter pain medicines, like aspirin and ibuprofen.  Having a family history of diverticulosis. What are the signs or symptoms? In most people, there are no symptoms of this condition. If you do have symptoms, they may include:  Bloating.  Cramps in the  abdomen.  Constipation or diarrhea.  Pain in the lower left side of the abdomen. How is this diagnosed? Because diverticulosis usually has no symptoms, it is most often diagnosed during an exam for other colon problems. The condition may be diagnosed by:  Using a flexible scope to examine the colon (colonoscopy).  Taking an X-ray of the colon after dye has been put into the colon (barium enema).  Having a CT scan. How is this treated? You may not need treatment for this condition. Your health care provider may recommend treatment to prevent problems. You may need treatment if you have symptoms or if you previously had diverticulitis. Treatment may include:  Eating a high-fiber diet.  Taking a fiber supplement.  Taking a live bacteria supplement (probiotic).  Taking medicine to relax your colon. Follow these instructions at home: Medicines  Take over-the-counter and prescription medicines only as told by your health care provider.  If told by your health care provider, take a fiber supplement or probiotic. Constipation prevention Your condition may cause constipation. To prevent or treat constipation, you may need to:  Drink enough fluid to keep your urine pale yellow.  Take over-the-counter or prescription medicines.  Eat foods that are high in fiber, such as beans, whole grains, and fresh fruits and  vegetables.  Limit foods that are high in fat and processed sugars, such as fried or sweet foods.  General instructions  Try not to strain when you have a bowel movement.  Keep all follow-up visits as told by your health care provider. This is important. Contact a health care provider if you:  Have pain in your abdomen.  Have bloating.  Have cramps.  Have not had a bowel movement in 3 days. Get help right away if:  Your pain gets worse.  Your bloating becomes very bad.  You have a fever or chills, and your symptoms suddenly get worse.  You vomit.  You have  bowel movements that are bloody or black.  You have bleeding from your rectum. Summary  Diverticulosis is a condition that develops when small pouches (diverticula) form in the wall of the large intestine (colon).  You may have a few pouches or many of them.  This condition is most often diagnosed during an exam for other colon problems.  Treatment may include increasing the fiber in your diet, taking supplements, or taking medicines. This information is not intended to replace advice given to you by your health care provider. Make sure you discuss any questions you have with your health care provider. Document Revised: 08/11/2018 Document Reviewed: 08/11/2018 Elsevier Patient Education  [place].

## 2019-04-17 NOTE — Progress Notes (Signed)
This RN was called in pt.rm. Had BM with bright red stool in moderate amount.  Pt had no complaints of abdominal pain. Pt stated he was very anxious when he saw the blood. MD paged made aware. MD will call GI.

## 2019-04-17 NOTE — Plan of Care (Signed)

## 2019-04-17 NOTE — Discharge Summary (Addendum)
Physician Discharge Summary  Lonnie Hansen JHE:[id number] DOB: [birth year] DOA: 04/14/2019  PCP: [name], MD  Admit date: 04/14/2019 Discharge date: 04/17/2019  Time spent: *50 minutes  Recommendations for Outpatient Follow-up:  1. Follow-up PCP in 2 weeks.   Discharge Diagnoses:  Principal Problem:   Rectal bleeding Active Problems:   HTN (hypertension)   S/P AVR   Chronic ITP (idiopathic thrombocytopenia) (HCC)   Acute blood loss anemia   Acute GI bleeding   GI bleed   Discharge Condition: Stable  Diet recommendation: Heart healthy diet  Filed Weights   04/16/19 0527 04/16/19 1003 04/17/19 0509  Weight: 98.7 kg 98.7 kg 97 kg    History of present illness:  69 year old male with a history of aortic root grafting with aortic valve replacement with tissue valve, CAD, hypertension, chronic ITP came to ED with complaints of rectal bleeding.  Patient states that he had bleeding 3 months ago at that time PCP referred him to gastroenterology.  [name] plan to have endoscopy and colonoscopy next week but patient started having black stool yesterday so he came to ED.  He admits to having taking Goody powder for right hip pain from osteoarthritis.  He does have a history of ITP and is followed by [name] as outpatient.  Hospital Course:  * 1. GI bleed-patient has both black tarry stool as well as fresh blood in the stool.  Patient underwent EGD and colonoscopy.  No significant bleeding found on EGD, colonoscopy showed diverticulosis in the sigmoid, descending, transverse, ascending colon.  No further bleeding in the hospital.  Hemoglobin has remained stable at 9.3. 2. Acute blood loss anemia-hemoglobin dropped from 12.9-9.3.  Secondary to above.    Hemoglobin has been stable. 3. Chronic ITP-today patient's platelet count is 90,000.  Patient is followed by [name] as outpatient.  Patient received 2 units of platelets before EGD and colonoscopy. 4. Hypertension-blood  pressure is stable. 5. History of atrial grafting and aortic valve replacement with tissue valve-no active issues noted at this time.  6. Patient had one episode of bloody bowel movement yesterday before discharge, so discharge was held and RBC tagged nuclear scan was ordered.  RBC tagged scan is negative.  No further episodes of bloody diarrhea.  Hemoglobin is 10.0 this morning.  Up from 9.3 yesterday.  I have called and discussed with GI, okay for discharge.  GI will follow up as outpatient.   Procedures:  EGD  Colonoscopy  Consultations:  Gastroenterology  Discharge Exam: Vitals:   04/17/19 0509 04/17/19 1144  BP: (!) 126/95 (!) 128/94  Pulse: 74 71  Resp: 18 17  Temp: 97.9 F (36.6 C) 98.5 F (36.9 C)  SpO2: 97% 99%    General: Appears in no acute distress Cardiovascular: S1-S2, regular Respiratory: Clear to auscultation bilaterally  Discharge Instructions   Discharge Instructions    Diet - low sodium heart healthy   Complete by: As directed    Increase activity slowly   Complete by: As directed      Allergies as of 04/17/2019   No Known Allergies     Medication List    TAKE these medications   amLODipine 10 MG tablet Commonly known as: NORVASC Take 10 mg by mouth daily.   aspirin 81 MG EC tablet Take 1 tablet (81 mg total) by mouth daily.   atenolol 25 MG tablet Commonly known as: TENORMIN Take 0.5 tablets (12.5 mg total) by mouth 2 (two) times daily.   latanoprost 0.005 %  ophthalmic solution Commonly known as: XALATAN Place 1 drop into both eyes at bedtime.   multivitamin with minerals Tabs tablet Take 1 tablet by mouth daily.   rosuvastatin 10 MG tablet Commonly known as: CRESTOR Take 1 tablet by mouth once daily   tamsulosin 0.4 MG Caps capsule Commonly known as: FLOMAX Take 0.4 mg by mouth daily.      No Known Allergies    The results of significant diagnostics from this hospitalization (including imaging, microbiology,  ancillary and laboratory) are listed below for reference.    Significant Diagnostic Studies: No results found.  Microbiology: Recent Results (from the past 240 hour(s))  SARS CORONAVIRUS 2 (TAT 6-24 HRS) Nasopharyngeal Nasopharyngeal Swab     Status: None   Collection Time: 04/14/19  9:54 PM   Specimen: Nasopharyngeal Swab  Result Value Ref Range Status   SARS Coronavirus 2 NEGATIVE NEGATIVE Final    Comment: (NOTE) SARS-CoV-2 target nucleic acids are NOT DETECTED. The SARS-CoV-2 RNA is generally detectable in upper and lower respiratory specimens during the acute phase of infection. Negative results do not preclude SARS-CoV-2 infection, do not rule out co-infections with other pathogens, and should not be used as the sole basis for treatment or other patient management decisions. Negative results must be combined with clinical observations, patient history, and epidemiological information. The expected result is Negative. Fact Sheet for Patients: SugarRoll.be Fact Sheet for Healthcare Providers: https://www.woods-mathews.com/ This test is not yet approved or cleared by the [place] FDA and  has been authorized for detection and/or diagnosis of SARS-CoV-2 by FDA under an Emergency Use Authorization (EUA). This EUA will remain  in effect (meaning this test can be used) for the duration of the COVID-19 declaration under Section 56 4(b)(1) of the Act, 21 U.S.C. section 360bbb-3(b)(1), unless the authorization is terminated or revoked sooner. Performed at [facility], [place] [address] [place], [place] [address]      Labs: Basic Metabolic Panel: Recent Labs  Lab 04/14/19 1535 04/16/19 0602 04/17/19 0504  NA 138 140 141  K 4.1 3.6 3.9  CL 102 106 104  CO2 25 24 28   GLUCOSE 119* 106* 106*  BUN 12 12 7*  CREATININE 1.06 0.96 1.18  CALCIUM 9.4 8.5* 8.9   Liver Function Tests: Recent Labs  Lab 04/14/19 1535  AST 27   ALT 9  ALKPHOS 47  BILITOT 0.7  PROT 7.8  ALBUMIN 4.7   No results for input(s): LIPASE, AMYLASE in the last 168 hours. No results for input(s): AMMONIA in the last 168 hours. CBC: Recent Labs  Lab 04/14/19 2324 04/15/19 0330 04/15/19 0711 04/16/19 0602 04/17/19 0504  WBC 7.4 7.1 6.4 5.8 5.8  HGB 12.7* 10.7* 10.7* 9.3* 9.3*  HCT 39.4 32.8* 32.7* 29.0* 28.8*  MCV 91.0 90.1 90.6 90.9 91.4  PLT 94* 92* 90* 134* 120*       Signed:  Oswald Hillock MD.  [facility] 04/17/2019, 1:45 PM

## 2019-04-17 NOTE — Progress Notes (Signed)
Patient is back from procedure, VSS, wife helped patient to eat and just left for the evening. Will continue to monitor throughout the shift.

## 2019-04-17 NOTE — Progress Notes (Signed)
Subjective: No further bleeding since yesterday afternoon. No abdominal pain.  Objective: Vital signs in last 24 hours: Temp:  [97.6 F (36.4 C)-98.6 F (37 C)] 97.9 F (36.6 C) (03/22 0509) Pulse Rate:  [67-81] 74 (03/22 0509) Resp:  [13-20] 18 (03/22 0509) BP: (108-139)/(45-95) 126/95 (03/22 0509) SpO2:  [97 %-100 %] 97 % (03/22 0509) Weight:  [97 kg] 97 kg (03/22 0509) Weight change: -0.048 kg Last BM Date: 04/16/19  PE: GEN:  NAD ABD:  Protuberant, soft, non-tender  Lab Results: CBC    Component Value Date/Time   WBC 5.8 04/17/2019 0504   RBC 3.15 (L) 04/17/2019 0504   HGB 9.3 (L) 04/17/2019 0504   HGB 13.1 08/17/2018 1204   HCT 28.8 (L) 04/17/2019 0504   HCT 38.6 08/17/2018 1204   PLT 120 (L) 04/17/2019 0504   PLT 89 (LL) 08/17/2018 1204   MCV 91.4 04/17/2019 0504   MCV 92 08/17/2018 1204   MCH 29.5 04/17/2019 0504   MCHC 32.3 04/17/2019 0504   RDW 13.6 04/17/2019 0504   RDW 14.6 08/17/2018 1204   LYMPHSABS 1.3 08/17/2018 1204   MONOABS 0.7 04/19/2018 1055   EOSABS 0.3 08/17/2018 1204   BASOSABS 0.0 08/17/2018 1204   CMP     Component Value Date/Time   NA 141 04/17/2019 0504   NA 139 08/17/2018 1203   K 3.9 04/17/2019 0504   CL 104 04/17/2019 0504   CO2 28 04/17/2019 0504   GLUCOSE 106 (H) 04/17/2019 0504   BUN 7 (L) 04/17/2019 0504   BUN 14 08/17/2018 1203   CREATININE 1.18 04/17/2019 0504   CREATININE 0.99 04/19/2018 1055   CREATININE 0.75 04/10/2016 1132   CALCIUM 8.9 04/17/2019 0504   PROT 7.8 04/14/2019 1535   PROT 7.3 08/17/2018 1203   ALBUMIN 4.7 04/14/2019 1535   ALBUMIN 4.7 08/17/2018 1203   AST 27 04/14/2019 1535   AST 17 04/19/2018 1055   ALT 9 04/14/2019 1535   ALT 10 04/19/2018 1055   ALKPHOS 47 04/14/2019 1535   BILITOT 0.7 04/14/2019 1535   BILITOT 0.5 08/17/2018 1203   BILITOT 0.5 04/19/2018 1055   GFRNONAA >60 04/17/2019 0504   GFRNONAA >60 04/19/2018 1055   GFRAA >60 04/17/2019 0504   GFRAA >60 04/19/2018 1055    Assessment:  1.  GI bleeding.  Most suspicious for diverticular bleeding.  No bleeding since yesterday afternoon. 2.  Acute blood loss anemia.  Hgb stable at this time. 3.  Thrombocytopenia.  Plan:  1.  Advance diet. 2.  Serial CBCs. 3.  If doing well without further bleeding, ok to discharge home later today or in the morning, from GI perspective. 4.  [facility] will sign-off; please call with questions; thank you for the consultation.   Lonnie Hansen 04/17/2019, 11:25 AM   Cell [phone] If no answer or after 5 PM call [phone]

## 2019-04-18 LAB — CBC
HCT: 30.8 % — ABNORMAL LOW (ref 39.0–52.0)
Hemoglobin: 10 g/dL — ABNORMAL LOW (ref 13.0–17.0)
MCH: 29.9 pg (ref 26.0–34.0)
MCHC: 32.5 g/dL (ref 30.0–36.0)
MCV: 91.9 fL (ref 80.0–100.0)
Platelets: 115 10*3/uL — ABNORMAL LOW (ref 150–400)
RBC: 3.35 MIL/uL — ABNORMAL LOW (ref 4.22–5.81)
RDW: 13.9 % (ref 11.5–15.5)
WBC: 6.7 10*3/uL (ref 4.0–10.5)
nRBC: 0 % (ref 0.0–0.2)

## 2019-04-18 LAB — PATHOLOGIST SMEAR REVIEW

## 2019-04-18 NOTE — Progress Notes (Signed)
No further bleeding.  Hgb stable.  Tagged RBC study negative.  OK to discharge home from GI perspective; we will arrange outpatient follow-up.

## 2019-04-21 ENCOUNTER — Other Ambulatory Visit: Payer: Self-pay

## 2019-04-21 NOTE — Patient Outreach (Signed)
Red emmi:  Reason for alert- no follow up scheduled.  Placed call to patient and reviewed reason for call. Patient reports he missed phone call from MD office yesterday and when he called back the office was closed. I encouraged patient to call back today and schedule follow up.  He agreed. I offered to assist but patient reports he can do it himself. Patient voices no active bleeding since hospital discharge.   PLAN: patient will call MD office today and schedule follow up visit. Denies needing any help at this time. Provided my contact number if he needs help.  Tomasa Rand, RN, BSN, CEN [facility] [employer] [phone]

## 2019-05-10 ENCOUNTER — Telehealth: Payer: Self-pay | Admitting: Cardiology

## 2019-05-10 DIAGNOSIS — K921 Melena: Secondary | ICD-10-CM | POA: Diagnosis not present

## 2019-05-10 DIAGNOSIS — D500 Iron deficiency anemia secondary to blood loss (chronic): Secondary | ICD-10-CM | POA: Diagnosis not present

## 2019-05-10 DIAGNOSIS — K5730 Diverticulosis of large intestine without perforation or abscess without bleeding: Secondary | ICD-10-CM | POA: Diagnosis not present

## 2019-05-10 DIAGNOSIS — K2100 Gastro-esophageal reflux disease with esophagitis, without bleeding: Secondary | ICD-10-CM | POA: Diagnosis not present

## 2019-05-10 NOTE — Progress Notes (Signed)
Cardiology Clinic Note   Patient Name: Lonnie Hansen Date of Encounter: 05/11/2019  Primary Care Provider:  [name], MD Primary Cardiologist:  Peter Martinique, MD  Patient Profile    Lonnie Hansen.  Lonnie Hansen 69 year old male presents today for evaluation of his lightheadedness and dizziness as well as hypertension.  He also noted some increased shortness of breath.  Past Medical History    Past Medical History:  Diagnosis Date  . Anemia   . Arthritis   . Asymptomatic bilateral carotid artery stenosis 08/2015   1-39%   . Blood dyscrasia    "trouble with my bloods clotting"  . Bruising    on the skin states due to platelets or sometimes low or high  . Cataracts, bilateral   . 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 Coronary artery disease   . Diverticulosis   . Enlarged aorta ([place])   . Enlarged prostate    slightly  . GERD (gastroesophageal reflux disease)    takes Pantoprazole daily as needed  . Glaucoma    uses eye drops daily  . Headache   . History of colon polyps    benign  . History of kidney stones   . Hyperlipidemia    no on any meds  . Hypertension    takes Amlodipine and Atenolol daily  . Joint pain   . Joint swelling   . Nocturia   . OSA on CPAP   . Sleep apnea   . Vocal cord nodule    pt. states  it's a" growth on vocal cord"   Past Surgical History:  Procedure Laterality Date  . AORTIC ARCH ANGIOGRAPHY N/A 04/16/2016   Procedure: Aortic Arch Angiography;  Surgeon: [name] Martinique, MD;  Location: [place] [facility];  Service: Cardiovascular;  Laterality: N/A;  . AORTIC VALVE REPLACEMENT N/A 09/15/2016   Procedure: AORTIC VALVE REPLACEMENT (AVR);  Surgeon: Grace Isaac, MD;  Location: [place];  Service: Open Heart Surgery;  Laterality: N/A;  Using 40mm Edwards Perimount Magna Ease Aortic Bioprosthesis Valve  . ASCENDING AORTIC ROOT REPLACEMENT N/A 09/15/2016   Procedure: ASCENDING AORTIC ROOT REPLACEMENT;  Surgeon: Grace Isaac, MD;  Location: [place];  Service: Open Heart Surgery;  Laterality: N/A;  Using 47mm Gelweave Valsalva Graft  . COLONOSCOPY    . COLONOSCOPY Left 04/16/2019   Procedure: COLONOSCOPY;  Surgeon: Arta Silence, MD;  Location: [facility];  Service: Endoscopy;  Laterality: Left;  . COLONOSCOPY WITH ESOPHAGOGASTRODUODENOSCOPY (EGD)    . ESOPHAGOGASTRODUODENOSCOPY (EGD) WITH PROPOFOL Left 04/16/2019   Procedure: ESOPHAGOGASTRODUODENOSCOPY (EGD) WITH PROPOFOL;  Surgeon: Arta Silence, MD;  Location: [place];  Service: Endoscopy;  Laterality: Left;  . IR THORACENTESIS ASP PLEURAL SPACE W/IMG GUIDE  10/23/2016  . MICROLARYNGOSCOPY Right 09/20/2017   Procedure: MICROLARYNGOSCOPY WITH  EXCISION OF VOCAL CORD LESION;  Surgeon: Leta Baptist, MD;  Location: [place];  Service: ENT;  Laterality: Right;  . MULTIPLE EXTRACTIONS WITH ALVEOLOPLASTY N/A 06/10/2016   Procedure: Extraction of tooth #'s 2,8,13,15, and 29  with alveoloplasty, maxillary right and left buccal exostoses reductions, and gross debridement of remaining teeth.;  Surgeon: Lenn Cal, DDS;  Location: [place];  Service: Oral Surgery;  Laterality: N/A;  . RIGHT/LEFT HEART CATH AND CORONARY ANGIOGRAPHY N/A 04/16/2016   Procedure: Right/Left Heart Cath and Coronary Angiography;  Surgeon: [name] Martinique, MD;  Location: [place] [facility];  Service: Cardiovascular;  Laterality: N/A;  . TEE WITHOUT CARDIOVERSION N/A 09/15/2016  Procedure: TRANSESOPHAGEAL ECHOCARDIOGRAM (TEE);  Surgeon: Grace Isaac, MD;  Location: [place];  Service: Open Heart Surgery;  Laterality: N/A;    Allergies  No Known Allergies  History of Present Illness    [name] has a past medical history of hyperlipidemia, hypertension, mild coronary artery disease, and severe aortic insufficiency with aortic root dilation status post repair.  In 2017 his CXR showed aortic root enlargement.  He underwent a CT which showed a dilated proximal aorta.   His descending aorta was normal.  Echocardiogram 8/17 showed an EF of 60-65%, moderate to severe AI, and G1 DD.  He was evaluated by Dr. Servando Snare.  He had a follow-up CT that showed no aortic root enlargement of 5.2 cm.  Due to the size of aortic root and degree of aortic insufficiency it was recommended that he undergo aortic root grafting and AVR.  He had a preoperative cardiac catheterization 3/18 that showed severe AI, EF 45%, 80% stenosis in his nondominant RCA, and 50% distal left anterior descending.  He underwent planned procedure with aortic ascending root replacement, and AVR on 8/18 by Dr. Servando Snare.  Postoperatively he was fluid volume overloaded and responded well to diuresis.  He also had postoperative thrombocytopenia.  He was placed on oral amiodarone for postop atrial fibrillation.  A follow-up CXR showed recurrent right pleural effusion.  Echocardiogram 9/18 showed only trivial amount of pericardial effusion, EF 60-65%.  He underwent right thoracentesis on 09/2016 with removal of 2 L of dark red blood.  Post procedure he developed a right pneumothorax related to incomplete reexpansion of his right middle lobe and lower lobe.  He was admitted on 10/18 for a pigtail drain placement, last CXR obtained on 10/18 showed improvement in right pleural effusion and resolution of right pneumo.  He is followed by Dr. Servando Snare and his repeat CT 10/2018 was improved.  He was last seen by Dr. Martinique on 12/2018.  During that time he stated he felt okay.  He had been going to the gym daily.  He was still 18 pounds up from 2019.  He denied dyspnea, chest pain, lower extremity edema.  It was noted that he would eventually need a hip replacement.  He contacted the nurse triage line on 05/10/2019 with complaints of increased shortness of breath and elevated blood pressure.  He presents to the clinic today and states he has had an intermittent episodes of dizziness.  He noticed it started in the morning a few weeks  ago when he woke up and looked at his phone.  He described his symptoms the past room spinning feeling that is relieved by laying down.  Symptoms are also by increasing physical exercise.  He presented to his PCP and was diagnosed with vertigo.  He was treated with Flonase.  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  He is also exercising 1 hour/day at the [facility].  I have asked him to increase his p.o. fluids.  I will repeat echocardiogram, ordered BMP, and have him follow-up with Dr. Martinique in 3 months.  I have asked him to return to PCP if lightheadedness continues.  Today he denies chest pain, shortness of breath, lower extremity edema, fatigue, palpitations, melena, hematuria, hemoptysis, diaphoresis, weakness, presyncope, syncope, orthopnea, and PND.   Home Medications    Prior to Admission medications   Medication Sig Start Date End Date Taking? Authorizing Provider  amLODipine (NORVASC) 10  MG tablet Take 10 mg by mouth daily.    [provider]  aspirin EC 81 MG EC tablet Take 1 tablet (81 mg total) by mouth daily. 09/24/16   Gold, Wayne E, PA-C  atenolol (TENORMIN) 25 MG tablet Take 0.5 tablets (12.5 mg total) by mouth 2 (two) times daily. 09/23/16   Gold, Wayne E, PA-C  latanoprost (XALATAN) 0.005 % ophthalmic solution Place 1 drop into both eyes at bedtime.    [provider]  Multiple Vitamin (MULTIVITAMIN WITH MINERALS) TABS tablet Take 1 tablet by mouth daily.    [provider]  rosuvastatin (CRESTOR) 10 MG tablet Take 1 tablet by mouth once daily 01/10/19   Martinique, [name], MD  tamsulosin (FLOMAX) 0.4 MG CAPS capsule Take 0.4 mg by mouth daily. 01/25/18   [provider]    Family History    Family History  Problem Relation Age of Onset  . Heart disease Father   . Heart attack Father   . Thyroid disease Sister    He indicated that his mother is deceased. He indicated  that his father is deceased. He indicated that his sister is alive.  Social History    Social History   Socioeconomic History  . Marital status: Married    Spouse name: Not on file  . Number of children: 1  . Years of education: Not on file  . Highest education level: Not on file  Occupational History  . Occupation: Musician  Tobacco Use  . Smoking status: Former Smoker    Packs/day: 1.00    Years: 25.00    Pack years: 25.00    Types: Cigarettes  . Smokeless tobacco: Never Used  Substance and Sexual Activity  . Alcohol use: No    Alcohol/week: 0.0 standard drinks  . Drug use: No  . Sexual activity: Not on file  Other Topics Concern  . 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   Social Determinants of Health   Financial Resource Strain:   . Difficulty of Paying Living Expenses:   Food Insecurity:   . Worried About Charity fundraiser in the Last Year:   . Arboriculturist in the Last Year:   Transportation Needs:   . Film/video editor (Medical):   Marland Kitchen Lack of Transportation (Non-Medical):   Physical Activity:   . Days of Exercise per Week:   . Minutes of Exercise per Session:   Stress:   . Feeling of Stress :   Social Connections:   . Frequency of Communication with Friends and Family:   . Frequency of Social Gatherings with Friends and Family:   . Attends Religious Services:   . Active Member of Clubs or Organizations:   . Attends Archivist Meetings:   Marland Kitchen Marital Status:   Intimate Partner Violence:   . Fear of Current or Ex-Partner:   . Emotionally Abused:   Marland Kitchen Physically Abused:   . Sexually Abused:      Review of Systems    General:  No chills, fever, night sweats or weight changes.  Cardiovascular:  No chest pain, dyspnea on exertion, edema, orthopnea, palpitations, paroxysmal nocturnal dyspnea. Dermatological: No rash, lesions/masses Respiratory: No cough, dyspnea Urologic: No  hematuria, dysuria Abdominal:   No nausea, vomiting, diarrhea, bright red blood per rectum, melena, or hematemesis Neurologic:  No visual changes, wkns, changes in mental status. All other  systems reviewed and are otherwise negative except as noted above.  Physical Exam    VS:  BP 136/78 (BP Location: Left Arm, Patient Position: Sitting, Cuff Size: Large)   Pulse (!) 59   Ht 5\' 7"  (1.702 m)   Wt 218 lb (98.9 kg)   BMI 34.14 kg/m  , BMI Body mass index is 34.14 kg/m. GEN: Well nourished, well developed, in no acute distress. HEENT: normal. Neck: Supple, no JVD, carotid bruits, or masses. Cardiac: RRR, no murmurs, rubs, or gallops. No clubbing, cyanosis, edema.  Radials/DP/PT 2+ and equal bilaterally.  Respiratory:  Respirations regular and unlabored, clear to auscultation bilaterally. GI: Soft, nontender, nondistended, BS + x 4. MS: no deformity or atrophy. Skin: warm and dry, no rash. Neuro:  Strength and sensation are intact. Psych: Normal affect.  Accessory Clinical Findings    ECG personally reviewed by me today-sinus bradycardia 59 bpm nonspecific ST and T wave abnormality- No acute changes  EKG 11/12/2017 Sinus rhythm with occasional PVCs nonspecific ST and T wave abnormality 70 bpm  Echocardiogram 10/09/2016 Study Conclusions   - Left ventricle: The cavity size was normal. Wall thickness was  increased in a pattern of mild LVH. Systolic function was normal.  The estimated ejection fraction was in the range of 60% to 65%.  - Aortic valve: AV prosthesis appears to open well Peak and mean  gradients through the valve are 9 and 5 mm Hg respectively.  - Mitral valve: Calcified annulus. Mildly thickened leaflets .  There was mild regurgitation.  - Pulmonary arteries: PA peak pressure: 37 mm Hg (S).  - Pericardium, extracardiac: A trivial pericardial effusion was  identified.  Cardiac catheterization 04/16/2016  Mid RCA lesion, 80 %stenosed.  Ost LAD to Prox  LAD lesion, 25 %stenosed.  Dist LAD lesion, 50 %stenosed.  There is mild left ventricular systolic dysfunction.  LV end diastolic pressure is normal.  The left ventricular ejection fraction is 45-50% by visual estimate.  There is no mitral valve regurgitation.  There is no aortic valve stenosis.  LV end diastolic pressure is normal.  1. 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 Mild LV dysfunction. EF 45%. 3. Ascending thoracic aortic aneurysm. 4. 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 5. Normal Right heart And LV filling pressures 6. Normal cardiac output.   Plan: Aortic root grafting and AVR.   Assessment & Plan   1.  Dizziness/lightheadedness-no dizziness today.  Last episode of dizziness was yesterday.  Carotid Dopplers 8/18 showed 1-39% stenosis right ICA and left ICA.  EKG today shows sinus bradycardia nonspecific ST and T wave abnormality 59 bpm.  Denies any new palpitations.  Appears to be related to vertigo.  Previously seen by PCP for this. Continue Flonase. Increase p.o. fluids Heart healthy low-sodium diet Increase physical activity as tolerated Follow-up with PCP if continued  History of aortic root and AVR with tissue valve-echocardiogram 9/18 showed normal LV function with the appropriately function prosthetic valve.  Follow-up CT 10/18 showed a stable descending aorta with mild dilation. Repeat echocardiogram  Coronary artery disease-no chest pain today.  Cardiac catheterizations 3/18 showed an 80% stenosis nondominant mid RCA and 50% distal LAD lesion.  Medical management was recommended Continue ASA Continue Crestor Heart healthy low-sodium diet Increase physical activity as tolerated  Essential hypertension-BP today 136/78.  Has been slightly elevated at home. Continue amlodipine 10 mg Atenolol 12.5 mg Heart healthy low-sodium diet-salty 6 given Increase physical activity as tolerated   Hyperlipidemia-08/17/2018: Cholesterol, Total  164; HDL 35; LDL Calculated 75; Triglycerides 272 Continue rosuvastatin 10 mg Repeat lipid panel Heart healthy low-sodium high-fiber diet Increase physical activity as tolerated   Disposition: Follow-up with Dr. Martinique in 3 months.    Jossie Ng. [facility] [place] [address] Office [phone] Fax [phone]

## 2019-05-10 NOTE — Telephone Encounter (Signed)
STAT if patient feels like he/she is going to faint   1) Are you dizzy now? No  2) Do you feel faint or have you passed out? No  3) Do you have any other symptoms? SOB         4) Have you checked your HR and BP            (record if available)? '   136/92  162/98    Pt c/o Shortness Of Breath: STAT if SOB developed within the last 24 hours or pt is noticeably SOB on the phone  1. Are you currently SOB (can you hear that pt is SOB on the phone)? No  2. How long have you been experiencing SOB? About 1 month 3. Are you SOB when sitting or when up moving around? Up and moving around  4. Are you currently experiencing any other symptoms? No

## 2019-05-10 NOTE — Telephone Encounter (Signed)
Contacted patient- he states that he has had some come and go lightheadedness, and dizziness when he does certain activites. Patient states that a while back he had Carotid US and told him he had some blockages, I seen report back in 2018, and with those I suggested he come in to be seen just to verify nothing else was going on.   BP seems to be slightly high, but not alarming, and he is taking his medications as prescribed.   Advised patient that if anything extreme occurs (change in vision, blurriness, chest pains, higher blood pressures) to go to the emergency room.   Visit scheduled for tomorrow with NP at 8:15-  Patient thankful for appointment.  Will route to make him aware of this appointment.

## 2019-05-11 ENCOUNTER — Encounter: Payer: Self-pay | Admitting: General Practice

## 2019-05-11 ENCOUNTER — Ambulatory Visit (INDEPENDENT_AMBULATORY_CARE_PROVIDER_SITE_OTHER): Payer: Medicare Other | Admitting: General Practice

## 2019-05-11 ENCOUNTER — Other Ambulatory Visit: Payer: Self-pay

## 2019-05-11 VITALS — BP 136/78 | HR 59 | Ht 67.0 in | Wt 218.0 lb

## 2019-05-11 DIAGNOSIS — Z79899 Other long term (current) drug therapy: Secondary | ICD-10-CM

## 2019-05-11 DIAGNOSIS — I480 Paroxysmal atrial fibrillation: Secondary | ICD-10-CM | POA: Diagnosis not present

## 2019-05-11 DIAGNOSIS — I10 Essential (primary) hypertension: Secondary | ICD-10-CM | POA: Diagnosis not present

## 2019-05-11 DIAGNOSIS — I351 Nonrheumatic aortic (valve) insufficiency: Secondary | ICD-10-CM

## 2019-05-11 DIAGNOSIS — Z9889 Other specified postprocedural states: Secondary | ICD-10-CM | POA: Diagnosis not present

## 2019-05-11 DIAGNOSIS — I2510 Atherosclerotic heart disease of native coronary artery without angina pectoris: Secondary | ICD-10-CM | POA: Diagnosis not present

## 2019-05-11 DIAGNOSIS — K921 Melena: Secondary | ICD-10-CM | POA: Diagnosis not present

## 2019-05-11 DIAGNOSIS — E785 Hyperlipidemia, unspecified: Secondary | ICD-10-CM

## 2019-05-11 DIAGNOSIS — R42 Dizziness and giddiness: Secondary | ICD-10-CM

## 2019-05-11 LAB — BASIC METABOLIC PANEL
BUN/Creatinine Ratio: 16 (ref 10–24)
BUN: 17 mg/dL (ref 8–27)
CO2: 23 mmol/L (ref 20–29)
Calcium: 9.9 mg/dL (ref 8.6–10.2)
Chloride: 101 mmol/L (ref 96–106)
Creatinine, Ser: 1.06 mg/dL (ref 0.76–1.27)
GFR calc Af Amer: 82 mL/min/{1.73_m2} (ref >59)
GFR calc non Af Amer: 71 mL/min/{1.73_m2} (ref >59)
Glucose: 90 mg/dL (ref 65–99)
Potassium: 4.2 mmol/L (ref 3.5–5.2)
Sodium: 140 mmol/L (ref 134–144)

## 2019-05-11 LAB — LIPID PANEL
Chol/HDL Ratio: 4.9 ratio (ref 0.0–5.0)
Cholesterol, Total: 157 mg/dL (ref 100–199)
HDL: 32 mg/dL — ABNORMAL LOW (ref >39)
LDL Chol Calc (NIH): 81 mg/dL (ref 0–99)
Triglycerides: 263 mg/dL — ABNORMAL HIGH (ref 0–149)
VLDL Cholesterol Cal: 44 mg/dL — ABNORMAL HIGH (ref 5–40)

## 2019-05-11 NOTE — Patient Instructions (Signed)
Medication Instructions:  The current medical regimen is effective;  continue present plan and medications as directed. Please refer to the Current Medication list given to you today. *If you need a refill on your cardiac medications before your next appointment, please call your pharmacy*  Lab Work: LIPID AND BMET TODAY If you have labs (blood work) drawn today and your tests are completely normal, you will receive your results only by:  [place] (if you have MyChart) OR A paper copy in the mail.  If you have any lab test that is abnormal or we need to change your treatment, we will call you to review the results. You may go to any [facility] that is convenient for you however, we do have a lab in our office that is able to assist you. You DO NOT need an appointment for our lab. The lab is open 8:00am and closes at 4:00pm. Lunch 12:45 - 1:45pm.  Testing/Procedures: Echocardiogram - Your physician has requested that you have an echocardiogram. Echocardiography is a painless test that uses sound waves to create images of your heart. It provides your doctor with information about the size and shape of your heart and how well your heart's chambers and valves are working. This procedure takes approximately one hour. There are no restrictions for this procedure. This will be performed at our [facility] location - [address], [address].  Special Instructions PLEASE INCREASE YOUR HYDRATION  IF YOU CONTINUE TO BE LIGHTHEADED YOU SHOULD CALL PCP TO DISCUSS  PLEASE READ AND FOLLOW SALTY 6-ATTACHED  PLEASE READ AND FOLLOW LOW SALT HEART HEALTHY DIET  Follow-Up: Your next appointment:  3 month(s) Please call our office 2 months in advance to schedule this appointment In Person with Peter Martinique, MD  At [facility], you and your health needs are our priority.  As part of our continuing mission to provide you with exceptional heart care, we have created designated Provider Care Teams.  These  Care Teams include your primary Cardiologist (physician) and Advanced Practice Providers (APPs -  Physician Assistants and Nurse Practitioners) who all work together to provide you with the care you need, when you need it.       Low-Sodium Eating Plan Sodium, which is an element that makes up salt, helps you maintain a healthy balance of fluids in your body. Too much sodium can increase your blood pressure and cause fluid and waste to be held in your body. Your health care provider or dietitian may recommend following this plan if you have high blood pressure (hypertension), kidney disease, liver disease, or heart failure. Eating less sodium can help lower your blood pressure, reduce swelling, and protect your heart, liver, and kidneys. What are tips for following this plan? General guidelines  Most people on this plan should limit their sodium intake to 1,500-2,000 mg (milligrams) of sodium each day. Reading food labels   The Nutrition Facts label lists the amount of sodium in one serving of the food. If you eat more than one serving, you must multiply the listed amount of sodium by the number of servings.  Choose foods with less than 140 mg of sodium per serving.  Avoid foods with 300 mg of sodium or more per serving. Shopping  Look for lower-sodium products, often labeled as "low-sodium" or "no salt added."  Always check the sodium content even if foods are labeled as "unsalted" or "no salt added".  Buy fresh foods. ? Avoid canned foods and premade or  frozen meals. ? Avoid canned, cured, or processed meats  Buy breads that have less than 80 mg of sodium per slice. Cooking  Eat more home-cooked food and less restaurant, buffet, and fast food.  Avoid adding salt when cooking. Use salt-free seasonings or herbs instead of table salt or sea salt. Check with your health care provider or pharmacist before using salt substitutes.  Cook with plant-based oils, such as canola,  sunflower, or olive oil. Meal planning  When eating at a restaurant, ask that your food be prepared with less salt or no salt, if possible.  Avoid foods that contain MSG (monosodium glutamate). MSG is sometimes added to [place] food, bouillon, and some canned foods. What foods are recommended? The items listed may not be a complete list. Talk with your dietitian about what dietary choices are best for you. Grains Low-sodium cereals, including oats, puffed wheat and rice, and shredded wheat. Low-sodium crackers. Unsalted rice. Unsalted pasta. Low-sodium bread. Whole-grain breads and whole-grain pasta. Vegetables Fresh or frozen vegetables. "No salt added" canned vegetables. "No salt added" tomato sauce and paste. Low-sodium or reduced-sodium tomato and vegetable juice. Fruits Fresh, frozen, or canned fruit. Fruit juice. Meats and other protein foods Fresh or frozen (no salt added) meat, poultry, seafood, and fish. Low-sodium canned tuna and salmon. Unsalted nuts. Dried peas, beans, and lentils without added salt. Unsalted canned beans. Eggs. Unsalted nut butters. Dairy Milk. Soy milk. Cheese that is naturally low in sodium, such as ricotta cheese, fresh mozzarella, or Swiss cheese Low-sodium or reduced-sodium cheese. Cream cheese. Yogurt. Fats and oils Unsalted butter. Unsalted margarine with no trans fat. Vegetable oils such as canola or olive oils. Seasonings and other foods Fresh and dried herbs and spices. Salt-free seasonings. Low-sodium mustard and ketchup. Sodium-free salad dressing. Sodium-free light mayonnaise. Fresh or refrigerated horseradish. Lemon juice. Vinegar. Homemade, reduced-sodium, or low-sodium soups. Unsalted popcorn and pretzels. Low-salt or salt-free chips. What foods are not recommended? The items listed may not be a complete list. Talk with your dietitian about what dietary choices are best for you. Grains Instant hot cereals. Bread stuffing, pancake, and biscuit  mixes. Croutons. Seasoned rice or pasta mixes. Noodle soup cups. Boxed or frozen macaroni and cheese. Regular salted crackers. Self-rising flour. Vegetables Sauerkraut, pickled vegetables, and relishes. Olives. [place] fries. Onion rings. Regular canned vegetables (not low-sodium or reduced-sodium). Regular canned tomato sauce and paste (not low-sodium or reduced-sodium). Regular tomato and vegetable juice (not low-sodium or reduced-sodium). Frozen vegetables in sauces. Meats and other protein foods Meat or fish that is salted, canned, smoked, spiced, or pickled. Bacon, ham, sausage, hotdogs, corned beef, chipped beef, packaged lunch meats, salt pork, jerky, pickled herring, anchovies, regular canned tuna, sardines, salted nuts. Dairy Processed cheese and cheese spreads. Cheese curds. Blue cheese. Feta cheese. String cheese. Regular cottage cheese. Buttermilk. Canned milk. Fats and oils Salted butter. Regular margarine. Ghee. Bacon fat. Seasonings and other foods Onion salt, garlic salt, seasoned salt, table salt, and sea salt. Canned and packaged gravies. Worcestershire sauce. Tartar sauce. Barbecue sauce. Teriyaki sauce. Soy sauce, including reduced-sodium. Steak sauce. Fish sauce. Oyster sauce. Cocktail sauce. Horseradish that you find on the shelf. Regular ketchup and mustard. Meat flavorings and tenderizers. Bouillon cubes. Hot sauce and Tabasco sauce. Premade or packaged marinades. Premade or packaged taco seasonings. Relishes. Regular salad dressings. Salsa. Potato and tortilla chips. Corn chips and puffs. Salted popcorn and pretzels. Canned or dried soups. Pizza. Frozen entrees and pot pies. Summary  Eating less sodium can help lower  your blood pressure, reduce swelling, and protect your heart, liver, and kidneys.  Most people on this plan should limit their sodium intake to 1,500-2,000 mg (milligrams) of sodium each day.  Canned, boxed, and frozen foods are high in sodium. Restaurant foods,  fast foods, and pizza are also very high in sodium. You also get sodium by adding salt to food.  Try to cook at home, eat more fresh fruits and vegetables, and eat less fast food, canned, processed, or prepared foods. This information is not intended to replace advice given to you by your health care provider. Make sure you discuss any questions you have with your health care provider. Document Revised: 12/25/2016 Document Reviewed: 01/06/2016 Elsevier Patient Education  [place] DIET Getting too much fat and cholesterol in your diet may cause health problems. Choosing the right foods helps keep your fat and cholesterol at normal levels. This can keep you from getting certain diseases.What are tips for following this plan? Meal planning  At meals, divide your plate into four equal parts: ? Fill one-half of your plate with vegetables and green salads. ? Fill one-fourth of your plate with whole grains. ? Fill one-fourth of your plate with low-fat (lean) protein foods.  Eat fish that is high in omega-3 fats at least two times a week. This includes mackerel, tuna, sardines, and salmon.  Eat foods that are high in fiber, such as whole grains, beans, apples, broccoli, carrots, peas, and barley. General tips   Work with your doctor to lose weight if you need to.  Avoid: ? Foods with added sugar. ? Fried foods. ? Foods with partially hydrogenated oils.  Limit alcohol intake to no more than 1 drink a day for nonpregnant women and 2 drinks a day for men. One drink equals 12 oz of beer, 5 oz of wine, or 1 oz of hard liquor. Reading food labels  Check food labels for: ? Trans fats. ? Partially hydrogenated oils. ? Saturated fat (g) in each serving. ? Cholesterol (mg) in each serving. ? Fiber (g) in each serving.  Choose foods with healthy fats, such as: ? Monounsaturated fats. ? Polyunsaturated fats. ? Omega-3 fats.  Choose grain products that have whole  grains. Look for the word "whole" as the first word in the ingredient list. Cooking  Cook foods using low-fat methods. These include baking, boiling, grilling, and broiling.  Eat more home-cooked foods. Eat at restaurants and buffets less often.  Avoid cooking using saturated fats, such as butter, cream, palm oil, palm kernel oil, and coconut oil. Recommended foods  Fruits  All fresh, canned (in natural juice), or frozen fruits. Vegetables  Fresh or frozen vegetables (raw, steamed, roasted, or grilled). Green salads. Grains  Whole grains, such as whole wheat or whole grain breads, crackers, cereals, and pasta. Unsweetened oatmeal, bulgur, barley, quinoa, or brown rice. Corn or whole wheat flour tortillas. Meats and other protein foods  Ground beef (85% or leaner), grass-fed beef, or beef trimmed of fat. Skinless chicken or [place]. Ground chicken or [place]. Pork trimmed of fat. All fish and seafood. Egg whites. Dried beans, peas, or lentils. Unsalted nuts or seeds. Unsalted canned beans. Nut butters without added sugar or oil. Dairy  Low-fat or nonfat dairy products, such as skim or 1% milk, 2% or reduced-fat cheeses, low-fat and fat-free ricotta or cottage cheese, or plain low-fat and nonfat yogurt. Fats and oils  Tub margarine without trans fats. Light or reduced-fat mayonnaise and  salad dressings. Avocado. Olive, canola, sesame, or safflower oils. The items listed above may not be a complete list of foods and beverages you can eat. Contact a dietitian for more information. Foods to avoid Fruits  Canned fruit in heavy syrup. Fruit in cream or butter sauce. Fried fruit. Vegetables  Vegetables cooked in cheese, cream, or butter sauce. Fried vegetables. Grains  White bread. White pasta. White rice. Cornbread. Bagels, pastries, and croissants. Crackers and snack foods that contain trans fat and hydrogenated oils. Meats and other protein foods  Fatty cuts of meat. Ribs, chicken  wings, bacon, sausage, bologna, salami, chitterlings, fatback, hot dogs, bratwurst, and packaged lunch meats. Liver and organ meats. Whole eggs and egg yolks. Chicken and [place] with skin. Fried meat. Dairy  Whole or 2% milk, cream, half-and-half, and cream cheese. Whole milk cheeses. Whole-fat or sweetened yogurt. Full-fat cheeses. Nondairy creamers and whipped toppings. Processed cheese, cheese spreads, and cheese curds. Beverages  Alcohol. Sugar-sweetened drinks such as sodas, lemonade, and fruit drinks. Fats and oils  Butter, stick margarine, lard, shortening, ghee, or bacon fat. Coconut, palm kernel, and palm oils. Sweets and desserts  Corn syrup, sugars, honey, and molasses. Candy. Jam and jelly. Syrup. Sweetened cereals. Cookies, pies, cakes, donuts, muffins, and ice cream. The items listed above may not be a complete list of foods and beverages you should avoid. Contact a dietitian for more information. Summary  Choosing the right foods helps keep your fat and cholesterol at normal levels. This can keep you from getting certain diseases.  At meals, fill one-half of your plate with vegetables and green salads.  Eat high-fiber foods, like whole grains, beans, apples, carrots, peas, and barley.  Limit added sugar, saturated fats, alcohol, and fried foods. This information is not intended to replace advice given to you by your health care provider. Make sure you discuss any questions you have with your health care provider.

## 2019-05-12 ENCOUNTER — Other Ambulatory Visit: Payer: Self-pay

## 2019-05-12 MED ORDER — OMEGA-3-ACID ETHYL ESTERS 1 G PO CAPS: 2.0000 g | ORAL_CAPSULE | Freq: Two times a day (BID) | ORAL | 3 refills | Status: DC

## 2019-05-16 DIAGNOSIS — H26493 Other secondary cataract, bilateral: Secondary | ICD-10-CM | POA: Diagnosis not present

## 2019-05-16 DIAGNOSIS — H04123 Dry eye syndrome of bilateral lacrimal glands: Secondary | ICD-10-CM | POA: Diagnosis not present

## 2019-05-16 DIAGNOSIS — R42 Dizziness and giddiness: Secondary | ICD-10-CM | POA: Diagnosis not present

## 2019-05-16 DIAGNOSIS — H401231 Low-tension glaucoma, bilateral, mild stage: Secondary | ICD-10-CM | POA: Diagnosis not present

## 2019-05-19 ENCOUNTER — Other Ambulatory Visit: Payer: Self-pay

## 2019-05-19 ENCOUNTER — Ambulatory Visit (HOSPITAL_COMMUNITY): Payer: Medicare Other | Attending: Cardiovascular Disease

## 2019-05-19 DIAGNOSIS — I351 Nonrheumatic aortic (valve) insufficiency: Secondary | ICD-10-CM | POA: Diagnosis not present

## 2019-05-19 DIAGNOSIS — Z9889 Other specified postprocedural states: Secondary | ICD-10-CM | POA: Diagnosis not present

## 2019-05-23 ENCOUNTER — Ambulatory Visit: Payer: Medicare Other | Admitting: Medical

## 2019-05-23 DIAGNOSIS — H6123 Impacted cerumen, bilateral: Secondary | ICD-10-CM | POA: Diagnosis not present

## 2019-05-23 DIAGNOSIS — R42 Dizziness and giddiness: Secondary | ICD-10-CM | POA: Diagnosis not present

## 2019-05-30 DIAGNOSIS — L819 Disorder of pigmentation, unspecified: Secondary | ICD-10-CM | POA: Diagnosis not present

## 2019-05-30 DIAGNOSIS — L304 Erythema intertrigo: Secondary | ICD-10-CM | POA: Diagnosis not present

## 2019-05-30 DIAGNOSIS — L84 Corns and callosities: Secondary | ICD-10-CM | POA: Diagnosis not present

## 2019-05-30 DIAGNOSIS — L0889 Other specified local infections of the skin and subcutaneous tissue: Secondary | ICD-10-CM | POA: Diagnosis not present

## 2019-05-30 DIAGNOSIS — L738 Other specified follicular disorders: Secondary | ICD-10-CM | POA: Diagnosis not present

## 2019-05-30 DIAGNOSIS — L218 Other seborrheic dermatitis: Secondary | ICD-10-CM | POA: Diagnosis not present

## 2019-07-06 DIAGNOSIS — I10 Essential (primary) hypertension: Secondary | ICD-10-CM | POA: Diagnosis not present

## 2019-07-06 DIAGNOSIS — D693 Immune thrombocytopenic purpura: Secondary | ICD-10-CM | POA: Diagnosis not present

## 2019-07-06 DIAGNOSIS — I2510 Atherosclerotic heart disease of native coronary artery without angina pectoris: Secondary | ICD-10-CM | POA: Diagnosis not present

## 2019-07-06 DIAGNOSIS — M25551 Pain in right hip: Secondary | ICD-10-CM | POA: Diagnosis not present

## 2019-07-06 DIAGNOSIS — D62 Acute posthemorrhagic anemia: Secondary | ICD-10-CM | POA: Diagnosis not present

## 2019-07-06 DIAGNOSIS — G4733 Obstructive sleep apnea (adult) (pediatric): Secondary | ICD-10-CM | POA: Diagnosis not present

## 2019-08-10 NOTE — Progress Notes (Signed)
Cardiology Office Note    Date:  08/14/2019   ID:  Lonnie Hansen, DOB 03-13-1950, MRN [id number]  PCP:  [name], MD  Cardiologist:  Dr. Martinique   Chief Complaint  Patient presents with   Aortic Insuffiency    History of Present Illness:  Lonnie Hansen is a 69 y.o. 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 In 2017 chest x-ray showed aortic root enlargement. He underwent CT that showed dilatation of proximal aorta. Descending aorta was normal. Echocardiogram obtained on 09/20/2015 showed EF 60-65%, moderate to severe AI, grade 1 DD. He was evaluated by Dr. Servando Snare. CT scan showed aortic root had enlarged to 5.2 cm. Given the size of aortic root and the degree of AI, it was recommended he undergo aortic root grafting and AVR. He underwent preoperative evaluation with cardiac catheterization on 04/14/2016 which showed moderate to severe AI, EF 45%, 80% stenosis in a nondominant RCA, 50% distal LAD. He eventually underwent the planned procedure with ascending aortic root replacement using 32 mm Gelweave Valsalva Graft and AVR using 29 mm Edwards Perimount Magna Ease aortic bioprosthesis valve on 09/15/2016 by Dr. Servando Snare.   When seen in follow up chest x-ray showed recurrent right pleural effusion.Echo on 10/09/2016 showed only a trivial amount of pericardial effusion, EF 60-65%. He underwent right thoracentesis again on 10/23/2016 with the removal 2 L of dark blood. Post procedure, he developed a right pneumothorax related to incomplete reexpansion of the right middle and lower lobe of the lung. He was admitted on 10/30/2016 for pigtail drain placement,  chest x-ray obtained on 11/04/2016 showed improvement of right pleural effusion and resolution of right pneumothorax. Followed by Dr. Servando Snare - Repeat CT chest in October 2020 looked good.   Patient was admitted in March 2021with lower GI bleed. Upper EGD normal.  Colonoscopy showed extensive diverticulosis but no active bleeding.   He was seen in April with symptoms of lightheadedness. Echo was unremarkable. Normal EF and normal AV prosthesis function.   On follow up today he is doing OK. He has a cough wih some thin clear phlegm production. No dyspnea or chest pain. No edema. Will eventually need hip replacement. Notes some red splotches on his skin.  Past Medical History:  Diagnosis Date   Anemia    Arthritis    Asymptomatic bilateral carotid artery stenosis 08/2015   1-39%    Blood dyscrasia    "trouble with my bloods clotting"   Bruising    on the skin states due to platelets or sometimes low or high   Cataracts, bilateral    Chronic ITP (idiopathic thrombocytopenia) ([facility]) 03/31/2018   Coronary artery disease    Diverticulosis    Enlarged aorta (HCC)    Enlarged prostate    slightly   GERD (gastroesophageal reflux disease)    takes Pantoprazole daily as needed   Glaucoma    uses eye drops daily   Headache    History of colon polyps    benign   History of kidney stones    Hyperlipidemia    no on any meds   Hypertension    takes Amlodipine and Atenolol daily   Joint pain    Joint swelling    Nocturia    OSA on CPAP    Sleep apnea    Vocal cord nodule    pt. states  it's a" growth on vocal cord"  Past Surgical History:  Procedure Laterality Date   AORTIC ARCH ANGIOGRAPHY N/A 04/16/2016   Procedure: Aortic Arch Angiography;  Surgeon: Roper Tolson M Martinique, MD;  Location: [facility];  Service: Cardiovascular;  Laterality: N/A;   AORTIC VALVE REPLACEMENT N/A 09/15/2016   Procedure: AORTIC VALVE REPLACEMENT (AVR);  Surgeon: Grace Isaac, MD;  Location: [place];  Service: Open Heart Surgery;  Laterality: N/A;  Using 93mm Edwards Perimount Magna Ease Aortic Bioprosthesis Valve   ASCENDING AORTIC ROOT REPLACEMENT N/A 09/15/2016   Procedure: ASCENDING AORTIC ROOT REPLACEMENT;  Surgeon: Grace Isaac, MD;  Location: [place];  Service: Open Heart Surgery;  Laterality: N/A;  Using 74mm Gelweave Valsalva Graft   COLONOSCOPY     COLONOSCOPY Left 04/16/2019   Procedure: COLONOSCOPY;  Surgeon: Arta Silence, MD;  Location: [facility];  Service: Endoscopy;  Laterality: Left;   COLONOSCOPY WITH ESOPHAGOGASTRODUODENOSCOPY (EGD)     ESOPHAGOGASTRODUODENOSCOPY (EGD) WITH PROPOFOL Left 04/16/2019   Procedure: ESOPHAGOGASTRODUODENOSCOPY (EGD) WITH PROPOFOL;  Surgeon: Arta Silence, MD;  Location: [place];  Service: Endoscopy;  Laterality: Left;   IR THORACENTESIS ASP PLEURAL SPACE W/IMG GUIDE  10/23/2016   MICROLARYNGOSCOPY Right 09/20/2017   Procedure: MICROLARYNGOSCOPY WITH  EXCISION OF VOCAL CORD LESION;  Surgeon: Leta Baptist, MD;  Location: [place];  Service: ENT;  Laterality: Right;   MULTIPLE EXTRACTIONS WITH ALVEOLOPLASTY N/A 06/10/2016   Procedure: Extraction of tooth #'s 2,8,13,15, and 29  with alveoloplasty, maxillary right and left buccal exostoses reductions, and gross debridement of remaining teeth.;  Surgeon: Lenn Cal, DDS;  Location: [place];  Service: Oral Surgery;  Laterality: N/A;   RIGHT/LEFT HEART CATH AND CORONARY ANGIOGRAPHY N/A 04/16/2016   Procedure: Right/Left Heart Cath and Coronary Angiography;  Surgeon: Terril Amaro M Martinique, MD;  Location: [facility];  Service: Cardiovascular;  Laterality: N/A;   TEE WITHOUT CARDIOVERSION N/A 09/15/2016   Procedure: TRANSESOPHAGEAL ECHOCARDIOGRAM (TEE);  Surgeon: Grace Isaac, MD;  Location: [place];  Service: Open Heart Surgery;  Laterality: N/A;    Current Medications: Outpatient Medications Prior to Visit  Medication Sig Dispense Refill   amLODipine (NORVASC) 10 MG tablet Take 10 mg by mouth daily.     aspirin EC 81 MG EC tablet Take 1 tablet (81 mg total) by mouth daily.     atenolol (TENORMIN) 25 MG tablet Take 0.5 tablets (12.5 mg total) by mouth 2 (two) times daily. 30 tablet 1   Ensure  Plus (ENSURE PLUS) LIQD Take 237 mLs by mouth.     latanoprost (XALATAN) 0.005 % ophthalmic solution Place 1 drop into both eyes at bedtime.     Multiple Vitamin (MULTIVITAMIN WITH MINERALS) TABS tablet Take 1 tablet by mouth daily.     omega-3 acid ethyl esters (LOVAZA) 1 g capsule Take 2 capsules (2 g total) by mouth 2 (two) times daily. 60 capsule 3   pantoprazole (PROTONIX) 40 MG tablet Take 40 mg by mouth daily.      rosuvastatin (CRESTOR) 10 MG tablet Take 1 tablet by mouth once daily 90 tablet 0   tamsulosin (FLOMAX) 0.4 MG CAPS capsule Take 0.4 mg by mouth daily. (Patient not taking: Reported on 08/14/2019)     No facility-administered medications prior to visit.     Allergies:   Patient has no known allergies.   Social History   Socioeconomic History   Marital status: Married    Spouse name: Not on file   Number of children: 1  Years of education: Not on file   Highest education level: Not on file  Occupational History   Occupation: Musician  Tobacco Use   Smoking status: Former Smoker    Packs/day: 1.00    Years: 25.00    Pack years: 25.00    Types: Cigarettes   Smokeless tobacco: Never Used  Scientific laboratory technician Use: Never used  Substance and Sexual Activity   Alcohol use: No    Alcohol/week: 0.0 standard drinks   Drug use: No   Sexual activity: Not on file  Other Topics Concern   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   Social Determinants of Health   Financial Resource Strain:    Difficulty of Paying Living Expenses:   Food Insecurity:    Worried About Charity fundraiser in the Last Year:    Arboriculturist in the Last Year:   Transportation Needs:    Film/video editor (Medical):    Lack of Transportation (Non-Medical):   Physical Activity:    Days of Exercise per Week:    Minutes of Exercise per Session:   Stress:    Feeling of Stress :   Social Connections:     Frequency of Communication with Friends and Family:    Frequency of Social Gatherings with Friends and Family:    Attends Religious Services:    Active Member of Clubs or Organizations:    Attends Archivist Meetings:    Marital Status:      Family History:  The patient's family history includes Heart attack in his father; Heart disease in his father; Thyroid disease in his sister.   ROS:   Please see the history of present illness.    ROS All other systems reviewed and are negative.   PHYSICAL EXAM:   VS:  BP 134/76    Pulse 62    Ht 5\' 10"  (1.778 m)    Wt 220 lb (99.8 kg)    BMI 31.57 kg/m    GENERAL:  Well appearing, obese BM in NAD. Walks with a cane. HEENT:  PERRL, EOMI, sclera are clear. Oropharynx is clear. NECK:  No jugular venous distention, carotid upstroke brisk and symmetric, no bruits, no thyromegaly or adenopathy LUNGS:  Clear to auscultation bilaterally CHEST:  Unremarkable HEART:  RRR,  PMI not displaced or sustained,S1 and S2 within normal limits, no S3, no S4: no clicks, no rubs, no murmurs ABD:  Soft, nontender. BS +, no masses or bruits. No hepatomegaly, no splenomegaly EXT:  2 + pulses throughout, no edema, no cyanosis no clubbing SKIN:  Warm and dry.  No rashes NEURO:  Alert and oriented x 3. Cranial nerves II through XII intact. PSYCH:  Cognitively intact   Wt Readings from Last 3 Encounters:  08/14/19 220 lb (99.8 kg)  05/11/19 218 lb (98.9 kg)  04/18/19 211 lb 8 oz (95.9 kg)      Studies/Labs Reviewed:   EKG:  EKG is  Not ordered today.     Recent Labs: 08/17/2018: TSH 3.190 04/14/2019: ALT 9 04/18/2019: Hemoglobin 10.0; Platelets 115 05/11/2019: BUN 17; Creatinine, Ser 1.06; Potassium 4.2; Sodium 140   Lipid Panel    Component Value Date/Time   CHOL 157 05/11/2019 0946   TRIG 263 (H) 05/11/2019 0946   HDL 32 (L) 05/11/2019 0946   CHOLHDL 4.9 05/11/2019 0946   CHOLHDL 3.5 04/28/2010  0703   VLDL 14 04/28/2010 0703    LDLCALC 81 05/11/2019 0946    Additional studies/ records that were reviewed today include:   Echo 09/20/2015 LV EF: 60% - 65%  Study Conclusions  - Left ventricle: The cavity size was normal. There was moderate focal basal hypertrophy of the septum. Systolic function was normal. The estimated ejection fraction was in the range of 60% to 65%. Wall motion was normal; there were no regional wall motion abnormalities. Doppler parameters are consistent with abnormal left ventricular relaxation (grade 1 diastolic dysfunction). - Aortic valve: There was moderate to severe regurgitation. - Aorta: Aortic root dimension: 43 mm (ED). - Ascending aorta: The ascending aorta was mildly dilated. - Right ventricle: The cavity size was mildly dilated. Wall thickness was normal.    Cath 04/16/2016 Conclusion     Mid RCA lesion, 80 %stenosed.  Ost LAD to Prox LAD lesion, 25 %stenosed.  Dist LAD lesion, 50 %stenosed.  There is mild left ventricular systolic dysfunction.  LV end diastolic pressure is normal.  The left ventricular ejection fraction is 45-50% by visual estimate.  There is no mitral valve regurgitation.  There is no aortic valve stenosis.  LV end diastolic pressure is normal.  1. 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 Mild LV dysfunction. EF 45%. 3. Ascending thoracic aortic aneurysm. 4. 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 5. Normal Right heart And LV filling pressures 6. Normal cardiac output.   Plan: Aortic root grafting and AVR.      Echo 09/16/2016 LV EF: 55% - 60%  Study Conclusions  - Left ventricle: The cavity size was normal. Wall thickness was increased in a pattern of severe LVH. Systolic function was normal. The estimated ejection fraction was in the range of 55% to 60%. - Aortic valve: Valve area (VTI): 4.29 cm^2. Valve area (Vmax): 4.15 cm^2. Valve area (Vmean): 3.88 cm^2. - Mitral  valve: Severely calcified annulus. Valve area by continuity equation (using LVOT flow): 3.35 cm^2. - Pericardium, extracardiac: Localized moderate pericardial effusion posterior to the LA.    Limited Echo 10/09/2016 LV EF: 60% -   65%  Study Conclusions  - Left ventricle: The cavity size was normal. Wall thickness was   increased in a pattern of mild LVH. Systolic function was normal.   The estimated ejection fraction was in the range of 60% to 65%. - Aortic valve: AV prosthesis appears to open well Peak and mean   gradients through the valve are 9 and 5 mm Hg respectively. - Mitral valve: Calcified annulus. Mildly thickened leaflets .   There was mild regurgitation. - Pulmonary arteries: PA peak pressure: 37 mm Hg (S). - Pericardium, extracardiac: A trivial pericardial effusion was   identified.   R thoracentesis 10/23/2016 Post CXR: IMPRESSION: Resolution of right-sided pleural effusion. Right pneumothorax is noted related to incomplete re-expansion of the right middle and lower lobes. This does not represent a true pneumothorax. Continued follow-up is recommended.  CT ANGIOGRAPHY CHEST WITH CONTRAST  TECHNIQUE: Multidetector CT imaging of the chest was performed using the standard protocol during bolus administration of intravenous contrast. Multiplanar CT image reconstructions and MIPs were obtained to evaluate the vascular anatomy.  CONTRAST:  19mL ISOVUE-370 IOPAMIDOL (ISOVUE-370) INJECTION 76%  COMPARISON:  03/26/2016, 01/30/2014  FINDINGS: Cardiovascular: There are changes consistent with aortic valve replacement. Coronary calcifications are noted. Aortic root repair is noted as well. At the level of the sinus of Valsalva aorta measures approximately 4.2 cm. The ascending  component is widely patent consistent with the repair. The thoracic arch and descending thoracic aorta show atherosclerotic change. A focal outpouching associated with some soft  atherosclerotic plaque is noted best seen on image number 96 of series 16 and image number 47 of series 7. This is slightly more prominent than on the previous preoperative exam. No dissection is noted. No significant cardiac enlargement is seen.  Mediastinum/Nodes: Thoracic inlet is within normal limits. No significant hilar or mediastinal adenopathy is noted. The esophagus is within normal limits.  Lungs/Pleura: The lungs are well aerated bilaterally. No focal infiltrate or sizable effusion is seen. No parenchymal nodules are noted.  Upper Abdomen: Visualized upper abdomen demonstrates a large right parapelvic cyst as well as a large lamellated gallstone. Stable partially calcified partially cystic lesion in the left kidney is again seen similar to that noted in 2016.  Musculoskeletal: Mild degenerative changes of the thoracic spine are seen. No acute bony abnormality is noted. Prior median sternotomy is seen.  Review of the MIP images confirms the above findings.  IMPRESSION: Status post aortic valve replacement and ascending aortic repair without complicating factors.  Focal mild outpouching associated with some soft atherosclerotic plaque in the proximal descending thoracic aorta. This is slightly more prominent than that seen on the prior exam. Attention on follow-up examinations is recommended. No true dissection is seen.  Stable changes in the upper abdomen without complication.   Electronically Signed   By: Inez Catalina M.D.   On: 05/04/2017 13:49   Echo 05/19/19: IMPRESSIONS    1. Left ventricular ejection fraction, by estimation, is 55 to 60%. The  left ventricle has normal function. The left ventricle has no regional  wall motion abnormalities. There is mild left ventricular hypertrophy of  the basal-septal segment. Left  ventricular diastolic parameters were normal.  2. Right ventricular systolic function is normal. The right ventricular   size is normal. There is normal pulmonary artery systolic pressure. The  estimated right ventricular systolic pressure is 77.8 mmHg.  3. Left atrial size was mildly dilated.  4. The mitral valve is normal in structure. Mild to moderate mitral valve  regurgitation. No evidence of mitral stenosis.  5. The aortic valve has been repaired/replaced. Aortic valve  regurgitation is not visualized. No aortic stenosis is present. 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 Procedure  Date: 2018. Echo findings are consistent  with normal structure and function of the aortic valve prosthesis. Aortic  valve mean gradient measures 10.8 mmHg. Aortic valve Vmax measures 2.12  m/s.  6. Aortic root/ascending aorta has been repaired/replaced.  7. The inferior vena cava is normal in size with greater than 50%  respiratory variability, suggesting right atrial pressure of 3 mmHg.   Comparison(s): Prior images unable to be directly viewed, comparison made  by report only.   ASSESSMENT:    1. S/P aortic aneurysm repair   2. S/P AVR (aortic valve replacement)   3. Hyperlipidemia, unspecified hyperlipidemia type   4. Essential hypertension      PLAN:  In order of problems listed above:  1. H/o Aortic root repair and AVR with a tissue valve.:   Repeat Echo in March 2021 showed good prosthetic valve function. Repeat CT in October showed mild dilation of the descending aorta that is stable.    2. Recurrent right pleural effusion- resolved.   3. CAD: asymptomatic.  80% mid nondominant RCA and a 50% distal LAD lesion on previous cardiac  catheterization.  Continue medical therapy.   4. Hypertension: Blood pressure is under good control.  5. Hyperlipidemia: Cholesterol looks OK but triglycerides are high related to significant weight gain. Needs to focus on controlling weight.   6.   ITP followed by Dr [name]. Last plt count 113K.    Medication Adjustments/Labs and  Tests Ordered: Current medicines are reviewed at length with the patient today.  Concerns regarding medicines are outlined above.  Medication changes, Labs and Tests ordered today are listed in the Patient Instructions below. There are no Patient Instructions on file for this visit.   Signed, Tanishi Nault Martinique, MD  08/14/2019 1:51 PM    [address], [place], [place]  [id number] Phone: [phone]; Fax: [phone]

## 2019-08-14 ENCOUNTER — Ambulatory Visit (INDEPENDENT_AMBULATORY_CARE_PROVIDER_SITE_OTHER): Payer: Medicare Other | Admitting: Cardiology

## 2019-08-14 ENCOUNTER — Encounter: Payer: Self-pay | Admitting: Cardiology

## 2019-08-14 VITALS — BP 134/76 | HR 62 | Ht 70.0 in | Wt 220.0 lb

## 2019-08-14 DIAGNOSIS — E785 Hyperlipidemia, unspecified: Secondary | ICD-10-CM

## 2019-08-14 DIAGNOSIS — E755 Other lipid storage disorders: Secondary | ICD-10-CM | POA: Diagnosis not present

## 2019-08-14 DIAGNOSIS — I351 Nonrheumatic aortic (valve) insufficiency: Secondary | ICD-10-CM | POA: Diagnosis not present

## 2019-08-14 DIAGNOSIS — I2510 Atherosclerotic heart disease of native coronary artery without angina pectoris: Secondary | ICD-10-CM | POA: Diagnosis not present

## 2019-08-14 DIAGNOSIS — Z9889 Other specified postprocedural states: Secondary | ICD-10-CM

## 2019-08-14 DIAGNOSIS — Z952 Presence of prosthetic heart valve: Secondary | ICD-10-CM

## 2019-08-14 DIAGNOSIS — I10 Essential (primary) hypertension: Secondary | ICD-10-CM | POA: Diagnosis not present

## 2019-08-14 DIAGNOSIS — Z8679 Personal history of other diseases of the circulatory system: Secondary | ICD-10-CM | POA: Diagnosis not present

## 2019-08-16 DIAGNOSIS — L738 Other specified follicular disorders: Secondary | ICD-10-CM | POA: Diagnosis not present

## 2019-08-24 DIAGNOSIS — U071 COVID-19: Secondary | ICD-10-CM | POA: Diagnosis not present

## 2019-08-24 DIAGNOSIS — L0292 Furuncle, unspecified: Secondary | ICD-10-CM | POA: Diagnosis not present

## 2019-08-24 DIAGNOSIS — Z20818 Contact with and (suspected) exposure to other bacterial communicable diseases: Secondary | ICD-10-CM | POA: Diagnosis not present

## 2019-08-24 DIAGNOSIS — R05 Cough: Secondary | ICD-10-CM | POA: Diagnosis not present

## 2019-08-31 DIAGNOSIS — Z20822 Contact with and (suspected) exposure to covid-19: Secondary | ICD-10-CM | POA: Diagnosis not present

## 2019-09-05 DIAGNOSIS — Z20822 Contact with and (suspected) exposure to covid-19: Secondary | ICD-10-CM | POA: Diagnosis not present

## 2019-09-20 DIAGNOSIS — L308 Other specified dermatitis: Secondary | ICD-10-CM | POA: Diagnosis not present

## 2019-10-16 ENCOUNTER — Other Ambulatory Visit: Payer: Self-pay | Admitting: Cardiothoracic Surgery

## 2019-10-16 DIAGNOSIS — I712 Thoracic aortic aneurysm, without rupture, unspecified: Secondary | ICD-10-CM

## 2019-11-21 DIAGNOSIS — Z23 Encounter for immunization: Secondary | ICD-10-CM | POA: Diagnosis not present

## 2019-11-30 ENCOUNTER — Ambulatory Visit (INDEPENDENT_AMBULATORY_CARE_PROVIDER_SITE_OTHER): Payer: Medicare Other | Admitting: Cardiothoracic Surgery

## 2019-11-30 ENCOUNTER — Other Ambulatory Visit: Payer: Self-pay

## 2019-11-30 ENCOUNTER — Ambulatory Visit
Admission: RE | Admit: 2019-11-30 | Discharge: 2019-11-30 | Disposition: A | Discharge status: Home / Self Care | Payer: Medicare Other | Source: Ambulatory Visit | Attending: Cardiothoracic Surgery | Admitting: Cardiothoracic Surgery

## 2019-11-30 ENCOUNTER — Encounter: Payer: Self-pay | Admitting: Cardiothoracic Surgery

## 2019-11-30 VITALS — BP 148/87 | HR 67 | Resp 18 | Ht 70.0 in | Wt 222.0 lb

## 2019-11-30 DIAGNOSIS — I77810 Thoracic aortic ectasia: Secondary | ICD-10-CM | POA: Diagnosis not present

## 2019-11-30 DIAGNOSIS — I712 Thoracic aortic aneurysm, without rupture, unspecified: Secondary | ICD-10-CM

## 2019-11-30 DIAGNOSIS — I2510 Atherosclerotic heart disease of native coronary artery without angina pectoris: Secondary | ICD-10-CM | POA: Diagnosis not present

## 2019-11-30 MED ORDER — IOPAMIDOL (ISOVUE-370) INJECTION 76%
75.0000 mL | Freq: Once | INTRAVENOUS | Status: AC | PRN
  Administered 2019-11-30: 75 mL via INTRAVENOUS

## 2019-11-30 NOTE — Progress Notes (Signed)
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 PROCEDURES:  Biologic Bentall with replacement of aortic valve with [employer] pericardial tissue valve, model 3300TFX 29 mm, serial #[id number], and replacement of the aortic root and ascending aorta with a 32 mm Gelweave Valsalva graft and reimplantation of the right and left coronary buttons. SURGEON: Grace Isaac MD  History of Present Illness:     Patient returns to the office today with a follow-up CT of the chest.  This is postop evaluation of his replacement of aortic root and ascending aorta for dilated ascending aorta and severe aortic insufficiency August 2018.  Patient continues to have some difficulty walking because of joint problems specifically his right hip, he notes he is considering right hip replacement in the near future.  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 Anemia   . Arthritis   . Asymptomatic bilateral carotid artery stenosis 08/2015   1-39%   . Blood dyscrasia    "trouble with my bloods clotting"  . Bruising    on the skin states due to platelets or sometimes low or high  . Cataracts, bilateral   . 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 Coronary artery disease   . Diverticulosis   . Enlarged aorta ([facility])   . Enlarged prostate    slightly  . GERD (gastroesophageal reflux disease)    takes  Pantoprazole daily as needed  . Glaucoma    uses eye drops daily  . Headache   . History of colon polyps    benign  . History of kidney stones   . Hyperlipidemia    no on any meds  . Hypertension    takes Amlodipine and Atenolol daily  . Joint pain   . Joint swelling   . Nocturia   . OSA on CPAP   . Sleep apnea   . Vocal cord nodule    pt. states  it's a" growth on vocal cord"     Social History   Tobacco Use  Smoking Status Former Smoker  . Packs/day: 1.00  . Years: 25.00  . Pack years: 25.00  . Types: Cigarettes  Smokeless Tobacco Never Used    Social History   Substance and Sexual Activity  Alcohol Use No  . Alcohol/week: 0.0 standard drinks     No Known Allergies  Current Outpatient Medications  Medication Sig Dispense Refill  . amLODipine (NORVASC) 10 MG tablet Take 10 mg by mouth daily.    Marland Kitchen aspirin EC 81 MG EC tablet Take 1 tablet (81 mg total) by mouth daily.    Marland Kitchen atenolol (TENORMIN) 25 MG tablet Take 0.5 tablets (12.5 mg total) by mouth 2 (two) times daily. 30 tablet 1  . Ensure Plus (ENSURE PLUS) LIQD Take 237 mLs by mouth.    . latanoprost (XALATAN) 0.005 %  ophthalmic solution Place 1 drop into both eyes at bedtime.    . Multiple Vitamin (MULTIVITAMIN WITH MINERALS) TABS tablet Take 1 tablet by mouth daily.    Marland Kitchen omega-3 acid ethyl esters (LOVAZA) 1 g capsule Take 2 capsules (2 g total) by mouth 2 (two) times daily. 60 capsule 3  . pantoprazole (PROTONIX) 40 MG tablet Take 40 mg by mouth daily.     . rosuvastatin (CRESTOR) 10 MG tablet Take 1 tablet by mouth once daily 90 tablet 0   No current facility-administered medications for this visit.       Physical Exam: BP (!) 148/87 (BP Location: Left Arm, Patient Position: Sitting)   Pulse 67   Resp 18   Ht 5\' 10"  (1.778 m)   Wt 222 lb (100.7 kg)   SpO2 96% Comment: RA with mask on  BMI 31.85 kg/m  General appearance: alert and cooperative Head: Normocephalic, without obvious abnormality,  atraumatic Neck: no adenopathy, no carotid bruit, no JVD, supple, symmetrical, trachea midline and thyroid not enlarged, symmetric, no tenderness/mass/nodules Resp: clear to auscultation bilaterally Cardio: regular rate and rhythm, S1, S2 normal, no murmur, click, rub or gallop GI: soft, non-tender; bowel sounds normal; no masses,  no organomegaly Extremities: extremities normal, atraumatic, no cyanosis or edema and Homans sign is negative, no sign of DVT Neurologic: Grossly normal No palpable popliteal aneurysm bilateral  Diagnostic Studies & Laboratory data:     Recent Radiology Findings:   CT ANGIO CHEST AORTA W/CM & OR WO/CM  Result Date: 11/30/2019 CLINICAL DATA:  Follow-up thoracic aortic aneurysm. EXAM: CT ANGIOGRAPHY CHEST WITH CONTRAST TECHNIQUE: Multidetector CT imaging of the chest was performed using the standard protocol during bolus administration of intravenous contrast. Multiplanar CT image reconstructions and MIPs were obtained to evaluate the vascular anatomy. CONTRAST:  21mL ISOVUE-370 IOPAMIDOL (ISOVUE-370) INJECTION 76% Creatinine was obtained on site at [facility] at [address] Results: Creatinine 1.1 mg/dL. COMPARISON:  11/17/2018 FINDINGS: Cardiovascular: Previous median sternotomy and aortic valve repair. The heart size appears normal. Mild aortic atherosclerosis. The aortic root measures 4.2 cm in diameter, image 86/10. Unchanged from previous exam. The sino-tubular junction measures 3 cm in diameter, image 79/10. Unchanged. The ascending thoracic aorta measures 3.9 cm, image 80/10. Unchanged from previous exam. The transverse aortic arch measures 4.2 cm, image 131/12. Unchanged from previous exam The descending thoracic aorta measures 2.9 cm, image 131/10. Stable. No signs of aortic dissection. The great vessels appear widely patent. The main pulmonary artery is patent. No obstructing pulmonary embolus identified. Mediastinum/Nodes: Normal appearance of the  thyroid gland. The trachea appears patent and is midline. Normal appearance of the esophagus. No supraclavicular, axillary, mediastinal, or hilar lymph nodes. Lungs/Pleura: No pleural effusion. No airspace consolidation. Posterolateral left upper lobe scar is unchanged. Upper Abdomen: No acute abnormality. Gallstones. Left upper pole renal calcification measures 1.1 cm. Musculoskeletal: No acute findings. Review of the MIP images confirms the above findings. IMPRESSION: 1. 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 Recommend semi-annual imaging followup by CTA or MRA and referral to cardiothoracic surgery if not already obtained. This recommendation follows 2010 ACCF/AHA/AATS/ACR/ASA/SCA/SCAI/SIR/STS/SVM Guidelines for the Diagnosis and Management of Patients With Thoracic Aortic Disease. Circulation. 2010; 121: W098-J19. Aortic aneurysm NOS (ICD10-I71.9) 2. Status post aortic valve repair. 3. Gallstones. 4. Nonobstructing left renal calculus. 5. Aortic atherosclerosis. Aortic Atherosclerosis (ICD10-I70.0). Electronically Signed   By: Kerby Moors M.D.   On: 11/30/2019 10:02    Ct  Angio Chest Aorta W/cm &/or Wo/cm  Result Date: 11/17/2018 CLINICAL DATA:  Thoracic aortic aneurysm. EXAM: CT ANGIOGRAPHY CHEST WITH CONTRAST TECHNIQUE: Multidetector CT imaging of the chest was performed using the standard protocol during bolus administration of intravenous contrast. Multiplanar CT image reconstructions and MIPs were obtained to evaluate the vascular anatomy. CONTRAST:  1mL ISOVUE-370 IOPAMIDOL (ISOVUE-370) INJECTION 76% COMPARISON:  May 04, 2017. FINDINGS: Cardiovascular: Status post aortic valve replacement and surgical repair of ascending thoracic aorta. Great vessels are widely patent without significant stenosis. Transverse aortic arch measures 4.3 cm in diameter which is not significantly changed compared to prior exam. Stable mild focal outpouching is seen  involving the proximal descending thoracic aorta is noted. Coronary artery calcifications are noted. Normal cardiac size. No pericardial effusion is noted. Aortic root measures 4.2 cm in diameter which is unchanged. Mediastinum/Nodes: No enlarged mediastinal, hilar, or axillary lymph nodes. Thyroid gland, trachea, and esophagus demonstrate no significant findings. Lungs/Pleura: Lungs are clear. No pleural effusion or pneumothorax. Upper Abdomen: No acute abnormality. Musculoskeletal: No chest wall abnormality. No acute or significant osseous findings. Review of the MIP images confirms the above findings. IMPRESSION: Status post aortic valve replacement surgical Pera of ascending thoracic aorta. 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 Stable mild focal outpouching is seen involving the proximal descending thoracic aorta compared to prior exam. Coronary artery calcifications are noted. Electronically Signed   By: Marijo Conception M.D.   On: 11/17/2018 13:57   I have independently reviewed the above radiology studies  and reviewed the findings with the patient.    Recent Lab Findings: Lab Results  Component Value Date   WBC 6.7 04/18/2019   HGB 10.0 (L) 04/18/2019   HCT 30.8 (L) 04/18/2019   PLT 115 (L) 04/18/2019   GLUCOSE 90 05/11/2019   CHOL 157 05/11/2019   TRIG 263 (H) 05/11/2019   HDL 32 (L) 05/11/2019   LDLCALC 81 05/11/2019   ALT 9 04/14/2019   AST 27 04/14/2019   NA 140 05/11/2019   K 4.2 05/11/2019   CL 101 05/11/2019   CREATININE 1.06 05/11/2019   BUN 17 05/11/2019   CO2 23 05/11/2019   TSH 3.190 08/17/2018   INR 1.15 10/30/2016   HGBA1C 5.3 09/11/2016   Echo 05/19/2019    Left ventricular ejection fraction, by estimation, is 55 to 60%. The left ventricle has normal function. The left ventricle has no regional wall motion abnormalities. There is mild left ventricular hypertrophy of the basal-septal segment. Left ventricular diastolic  parameters were normal. 2. Right ventricular systolic function is normal. The right ventricular size is normal. There is normal pulmonary artery systolic pressure. The estimated right ventricular systolic pressure is 36.6 mmHg. 3. Left atrial size was mildly dilated. 4. The mitral valve is normal in structure. Mild to moderate mitral valve regurgitation. No evidence of mitral stenosis. 5. The aortic valve has been repaired/replaced. Aortic valve regurgitation is not visualized. No aortic stenosis is present. There is a 29 mm Magna bioprosthetic valve present in the aortic position. Procedure Date: 2018. Echo findings are consistent with normal structure and function of the aortic valve prosthesis. Aortic valve mean gradient measures 10.8 mmHg. Aortic valve Vmax measures 2.12 m/s. 6. Aortic root/ascending aorta has been repaired/replaced. 7. The inferior vena cava is normal in size with greater than 50% respiratory variability, suggesting right atrial pressure of 3 mmHg.    Assessment / Plan:     1/ Grossly stable 4.3 cm  aneurysmal dilatation of transverse aortic arch is noted.    2/ Stable mild focal outpouching  involving the proximal descending thoracic aorta .  Plan on follow-up CTA of the chest in 1 year  Again reviewed with the patient the need for bacterial endocarditis prophylaxis with invasive procedures including dental work   Grace Isaac MD      [address] [place] [address] Office [phone]   Beeper [phone]  12/06/2019 12:24 PM

## 2019-12-12 DIAGNOSIS — I10 Essential (primary) hypertension: Secondary | ICD-10-CM | POA: Diagnosis not present

## 2019-12-12 DIAGNOSIS — R202 Paresthesia of skin: Secondary | ICD-10-CM | POA: Diagnosis not present

## 2019-12-12 DIAGNOSIS — M79609 Pain in unspecified limb: Secondary | ICD-10-CM | POA: Diagnosis not present

## 2020-01-01 DIAGNOSIS — E785 Hyperlipidemia, unspecified: Secondary | ICD-10-CM | POA: Diagnosis not present

## 2020-01-01 DIAGNOSIS — Z125 Encounter for screening for malignant neoplasm of prostate: Secondary | ICD-10-CM | POA: Diagnosis not present

## 2020-01-08 DIAGNOSIS — R200 Anesthesia of skin: Secondary | ICD-10-CM | POA: Diagnosis not present

## 2020-01-08 DIAGNOSIS — Z1331 Encounter for screening for depression: Secondary | ICD-10-CM | POA: Diagnosis not present

## 2020-01-08 DIAGNOSIS — I10 Essential (primary) hypertension: Secondary | ICD-10-CM | POA: Diagnosis not present

## 2020-01-08 DIAGNOSIS — I700 Atherosclerosis of aorta: Secondary | ICD-10-CM | POA: Diagnosis not present

## 2020-01-08 DIAGNOSIS — E785 Hyperlipidemia, unspecified: Secondary | ICD-10-CM | POA: Diagnosis not present

## 2020-01-08 DIAGNOSIS — M79645 Pain in left finger(s): Secondary | ICD-10-CM | POA: Diagnosis not present

## 2020-01-08 DIAGNOSIS — R3121 Asymptomatic microscopic hematuria: Secondary | ICD-10-CM | POA: Diagnosis not present

## 2020-01-08 DIAGNOSIS — M25551 Pain in right hip: Secondary | ICD-10-CM | POA: Diagnosis not present

## 2020-01-08 DIAGNOSIS — D693 Immune thrombocytopenic purpura: Secondary | ICD-10-CM | POA: Diagnosis not present

## 2020-01-08 DIAGNOSIS — R82998 Other abnormal findings in urine: Secondary | ICD-10-CM | POA: Diagnosis not present

## 2020-01-08 DIAGNOSIS — I2510 Atherosclerotic heart disease of native coronary artery without angina pectoris: Secondary | ICD-10-CM | POA: Diagnosis not present

## 2020-01-08 DIAGNOSIS — K921 Melena: Secondary | ICD-10-CM | POA: Diagnosis not present

## 2020-01-08 DIAGNOSIS — G4733 Obstructive sleep apnea (adult) (pediatric): Secondary | ICD-10-CM | POA: Diagnosis not present

## 2020-01-08 DIAGNOSIS — Z0000 Encounter for general adult medical examination without abnormal findings: Secondary | ICD-10-CM | POA: Diagnosis not present

## 2020-01-08 LAB — IFOBT (OCCULT BLOOD): IFOBT: POSITIVE

## 2020-01-15 DIAGNOSIS — H04123 Dry eye syndrome of bilateral lacrimal glands: Secondary | ICD-10-CM | POA: Diagnosis not present

## 2020-01-15 DIAGNOSIS — H401231 Low-tension glaucoma, bilateral, mild stage: Secondary | ICD-10-CM | POA: Diagnosis not present

## 2020-02-06 DIAGNOSIS — R0981 Nasal congestion: Secondary | ICD-10-CM | POA: Diagnosis not present

## 2020-02-06 DIAGNOSIS — R059 Cough, unspecified: Secondary | ICD-10-CM | POA: Diagnosis not present

## 2020-02-06 DIAGNOSIS — Z1152 Encounter for screening for COVID-19: Secondary | ICD-10-CM | POA: Diagnosis not present

## 2020-02-06 LAB — POCT INFLUENZA A: Rapid Influenza A Ag: NEGATIVE

## 2020-02-21 DIAGNOSIS — K625 Hemorrhage of anus and rectum: Secondary | ICD-10-CM | POA: Diagnosis not present

## 2020-02-21 DIAGNOSIS — D500 Iron deficiency anemia secondary to blood loss (chronic): Secondary | ICD-10-CM | POA: Diagnosis not present

## 2020-02-21 DIAGNOSIS — D693 Immune thrombocytopenic purpura: Secondary | ICD-10-CM | POA: Diagnosis not present

## 2020-02-21 DIAGNOSIS — K648 Other hemorrhoids: Secondary | ICD-10-CM | POA: Diagnosis not present

## 2020-02-21 DIAGNOSIS — Z791 Long term (current) use of non-steroidal anti-inflammatories (NSAID): Secondary | ICD-10-CM | POA: Diagnosis not present

## 2020-02-21 DIAGNOSIS — K5790 Diverticulosis of intestine, part unspecified, without perforation or abscess without bleeding: Secondary | ICD-10-CM | POA: Diagnosis not present

## 2020-03-04 NOTE — Progress Notes (Unsigned)
Cardiology Office Note    Date:  03/06/2020   ID:  Koa, Zoeller 02-19-1950, MRN [id number]  PCP:  [name], MD  Cardiologist:  Dr. Martinique   Chief Complaint  Patient presents with  . Thoracic Aortic Aneurysm    History of Present Illness:  Lonnie Hansen is a 70 y.o. 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 In 2017 chest x-ray showed aortic root enlargement. He underwent CT that showed dilatation of proximal aorta. Descending aorta was normal. Echocardiogram obtained on 09/20/2015 showed EF 60-65%, moderate to severe AI, grade 1 DD. He was evaluated by Dr. Servando Snare. CT scan showed aortic root had enlarged to 5.2 cm. Given the size of aortic root and the degree of AI, it was recommended he undergo aortic root grafting and AVR. He underwent preoperative evaluation with cardiac catheterization on 04/14/2016 which showed moderate to severe AI, EF 45%, 80% stenosis in a nondominant RCA, 50% distal LAD. He eventually underwent the planned procedure with ascending aortic root replacement using 32 mm Gelweave Valsalva Graft and AVR using 29 mm Edwards Perimount Magna Ease aortic bioprosthesis valve on 09/15/2016 by Dr. Servando Snare.   When seen in follow up chest x-ray showed recurrent right pleural effusion.Echo on 10/09/2016 showed only a trivial amount of pericardial effusion, EF 60-65%. He underwent right thoracentesis again on 10/23/2016 with the removal 2 L of dark blood. Post procedure, he developed a right pneumothorax related to incomplete reexpansion of the right middle and lower lobe of the lung. He was admitted on 10/30/2016 for pigtail drain placement,  chest x-ray obtained on 11/04/2016 showed improvement of right pleural effusion and resolution of right pneumothorax. Followed by Dr. Servando Snare - Repeat CT chest in October 2020 looked good.   Patient was admitted in March 2021with lower GI bleed. Upper EGD normal.  Colonoscopy showed extensive diverticulosis but no active bleeding.   He was seen in April 2021 with symptoms of lightheadedness. Echo was unremarkable. Normal EF and normal AV prosthesis function.   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   On follow up today he is doing well from a cardiac standpoint.   No dyspnea or chest pain. No edema. Needs hip replacement. He did note some GI bleeding a couple of weeks ago. This has stopped. Saw Dr Philip Aspen and reports Hgb of 12.8.   Past Medical History:  Diagnosis Date  . Anemia   . Arthritis   . Asymptomatic bilateral carotid artery stenosis 08/2015   1-39%   . Blood dyscrasia    "trouble with my bloods clotting"  . Bruising    on the skin states due to platelets or sometimes low or high  . Cataracts, bilateral   . 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 Coronary artery disease   . Diverticulosis   . Enlarged aorta ([place])   . Enlarged prostate    slightly  . GERD (gastroesophageal reflux disease)    takes Pantoprazole daily as needed  . Glaucoma    uses eye drops daily  . Headache   . History of colon polyps    benign  . History of kidney stones   . Hyperlipidemia    no on any meds  . Hypertension    takes Amlodipine and Atenolol daily  . Joint pain   . Joint swelling   . Nocturia   .  OSA on CPAP   . Sleep apnea   . Vocal cord nodule    pt. states  it's a" growth on vocal cord"    Past Surgical History:  Procedure Laterality Date  . AORTIC ARCH ANGIOGRAPHY N/A 04/16/2016   Procedure: Aortic Arch Angiography;  Surgeon: Shenandoah Vandergriff M Martinique, MD;  Location: [facility] [facility];  Service: Cardiovascular;  Laterality: N/A;  . AORTIC VALVE REPLACEMENT N/A 09/15/2016   Procedure: AORTIC VALVE REPLACEMENT (AVR);  Surgeon: Grace Isaac, MD;  Location: [facility];  Service: Open Heart Surgery;  Laterality: N/A;  Using 20mm Edwards Perimount Magna Ease Aortic Bioprosthesis Valve  .  ASCENDING AORTIC ROOT REPLACEMENT N/A 09/15/2016   Procedure: ASCENDING AORTIC ROOT REPLACEMENT;  Surgeon: Grace Isaac, MD;  Location: [facility];  Service: Open Heart Surgery;  Laterality: N/A;  Using 28mm Gelweave Valsalva Graft  . COLONOSCOPY    . COLONOSCOPY Left 04/16/2019   Procedure: COLONOSCOPY;  Surgeon: Arta Silence, MD;  Location: [facility];  Service: Endoscopy;  Laterality: Left;  . COLONOSCOPY WITH ESOPHAGOGASTRODUODENOSCOPY (EGD)    . ESOPHAGOGASTRODUODENOSCOPY (EGD) WITH PROPOFOL Left 04/16/2019   Procedure: ESOPHAGOGASTRODUODENOSCOPY (EGD) WITH PROPOFOL;  Surgeon: Arta Silence, MD;  Location: [place];  Service: Endoscopy;  Laterality: Left;  . IR THORACENTESIS ASP PLEURAL SPACE W/IMG GUIDE  10/23/2016  . MICROLARYNGOSCOPY Right 09/20/2017   Procedure: MICROLARYNGOSCOPY WITH  EXCISION OF VOCAL CORD LESION;  Surgeon: Leta Baptist, MD;  Location: [place];  Service: ENT;  Laterality: Right;  . MULTIPLE EXTRACTIONS WITH ALVEOLOPLASTY N/A 06/10/2016   Procedure: Extraction of tooth #'s 2,8,13,15, and 29  with alveoloplasty, maxillary right and left buccal exostoses reductions, and gross debridement of remaining teeth.;  Surgeon: Lenn Cal, DDS;  Location: [place];  Service: Oral Surgery;  Laterality: N/A;  . RIGHT/LEFT HEART CATH AND CORONARY ANGIOGRAPHY N/A 04/16/2016   Procedure: Right/Left Heart Cath and Coronary Angiography;  Surgeon: Babs Dabbs M Martinique, MD;  Location: [place] [facility];  Service: Cardiovascular;  Laterality: N/A;  . TEE WITHOUT CARDIOVERSION N/A 09/15/2016   Procedure: TRANSESOPHAGEAL ECHOCARDIOGRAM (TEE);  Surgeon: Grace Isaac, MD;  Location: [place];  Service: Open Heart Surgery;  Laterality: N/A;    Current Medications: Outpatient Medications Prior to Visit  Medication Sig Dispense Refill  . amLODipine (NORVASC) 10 MG tablet Take 10 mg by mouth daily.    Marland Kitchen aspirin EC 81 MG EC tablet Take 1 tablet (81 mg total) by mouth daily.     Marland Kitchen atenolol (TENORMIN) 25 MG tablet Take 0.5 tablets (12.5 mg total) by mouth 2 (two) times daily. 30 tablet 1  . Ensure Plus (ENSURE PLUS) LIQD Take 237 mLs by mouth.    . latanoprost (XALATAN) 0.005 % ophthalmic solution Place 1 drop into both eyes at bedtime.    . Multiple Vitamin (MULTIVITAMIN WITH MINERALS) TABS tablet Take 1 tablet by mouth daily.    Marland Kitchen omega-3 acid ethyl esters (LOVAZA) 1 g capsule Take 2 capsules (2 g total) by mouth 2 (two) times daily. 60 capsule 3  . pantoprazole (PROTONIX) 40 MG tablet Take 40 mg by mouth daily.     . rosuvastatin (CRESTOR) 10 MG tablet Take 1 tablet by mouth once daily 90 tablet 0   No facility-administered medications prior to visit.     Allergies:   Patient has no known allergies.   Social History   Socioeconomic History  . Marital status: Married    Spouse name: Not on file  .  Number of children: 1  . Years of education: Not on file  . Highest education level: Not on file  Occupational History  . Occupation: Musician  Tobacco Use  . Smoking status: Former Smoker    Packs/day: 1.00    Years: 25.00    Pack years: 25.00    Types: Cigarettes  . Smokeless tobacco: Never Used  Vaping Use  . Vaping Use: Never used  Substance and Sexual Activity  . Alcohol use: No    Alcohol/week: 0.0 standard drinks  . Drug use: No  . Sexual activity: Not on file  Other Topics Concern  . 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   Social Determinants of Health   Financial Resource Strain: Not on file  Food Insecurity: Not on file  Transportation Needs: Not on file  Physical Activity: Not on file  Stress: Not on file  Social Connections: Not on file     Family History:  The patient's family history includes Heart attack in his father; Heart disease in his father; Thyroid disease in his sister.   ROS:   Please see the history of present illness.    ROS All other systems reviewed and  are negative.   PHYSICAL EXAM:   VS:  BP 128/78   Pulse 66   Ht 5\' 9"  (1.753 m)   Wt 217 lb 9.6 oz (98.7 kg)   BMI 32.13 kg/m    GENERAL:  Well appearing, obese BM in NAD. Walks with a cane. HEENT:  PERRL, EOMI, sclera are clear. Oropharynx is clear. NECK:  No jugular venous distention, carotid upstroke brisk and symmetric, no bruits, no thyromegaly or adenopathy LUNGS:  Clear to auscultation bilaterally CHEST:  Unremarkable HEART:  RRR,  PMI not displaced or sustained,S1 and S2 within normal limits, no S3, no S4: no clicks, no rubs, gr 3-7/6 SEM ABD:  Soft, nontender. BS +, no masses or bruits. No hepatomegaly, no splenomegaly EXT:  2 + pulses throughout, no edema, no cyanosis no clubbing SKIN:  Warm and dry.  No rashes NEURO:  Alert and oriented x 3. Cranial nerves II through XII intact. PSYCH:  Cognitively intact   Wt Readings from Last 3 Encounters:  03/06/20 217 lb 9.6 oz (98.7 kg)  11/30/19 222 lb (100.7 kg)  08/14/19 220 lb (99.8 kg)      Studies/Labs Reviewed:   EKG:  EKG is ordered today. NSR with minor nonspecific ST abnormality. I have personally reviewed and interpreted this study.     Recent Labs: 04/14/2019: ALT 9 04/18/2019: Hemoglobin 10.0; Platelets 115 05/11/2019: BUN 17; Creatinine, Ser 1.06; Potassium 4.2; Sodium 140   Lipid Panel    Component Value Date/Time   CHOL 157 05/11/2019 0946   TRIG 263 (H) 05/11/2019 0946   HDL 32 (L) 05/11/2019 0946   CHOLHDL 4.9 05/11/2019 0946   CHOLHDL 3.5 04/28/2010 0703   VLDL 14 04/28/2010 0703   LDLCALC 81 05/11/2019 0946   Dated 02/21/20: creatinine 1.38. potassium 3.5. Hgb 11.3.   Additional studies/ records that were reviewed today include:   Echo 09/20/2015 LV EF: 60% - 65%  Study Conclusions  - Left ventricle: The cavity size was normal. There was moderate focal basal hypertrophy of the septum. Systolic function was normal. The estimated ejection fraction was in the range of 60% to 65%.  Wall motion was normal; there were no regional wall motion abnormalities. Doppler  parameters are consistent with abnormal left ventricular relaxation (grade 1 diastolic dysfunction). - Aortic valve: There was moderate to severe regurgitation. - Aorta: Aortic root dimension: 43 mm (ED). - Ascending aorta: The ascending aorta was mildly dilated. - Right ventricle: The cavity size was mildly dilated. Wall thickness was normal.    Cath 04/16/2016 Conclusion     Mid RCA lesion, 80 %stenosed.  Ost LAD to Prox LAD lesion, 25 %stenosed.  Dist LAD lesion, 50 %stenosed.  There is mild left ventricular systolic dysfunction.  LV end diastolic pressure is normal.  The left ventricular ejection fraction is 45-50% by visual estimate.  There is no mitral valve regurgitation.  There is no aortic valve stenosis.  LV end diastolic pressure is normal.  1. 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 Mild LV dysfunction. EF 45%. 3. Ascending thoracic aortic aneurysm. 4. 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 5. Normal Right heart And LV filling pressures 6. Normal cardiac output.   Plan: Aortic root grafting and AVR.      Echo 09/16/2016 LV EF: 55% - 60%  Study Conclusions  - Left ventricle: The cavity size was normal. Wall thickness was increased in a pattern of severe LVH. Systolic function was normal. The estimated ejection fraction was in the range of 55% to 60%. - Aortic valve: Valve area (VTI): 4.29 cm^2. Valve area (Vmax): 4.15 cm^2. Valve area (Vmean): 3.88 cm^2. - Mitral valve: Severely calcified annulus. Valve area by continuity equation (using LVOT flow): 3.35 cm^2. - Pericardium, extracardiac: Localized moderate pericardial effusion posterior to the LA.    Echo 05/19/19: IMPRESSIONS    1. Left ventricular ejection fraction, by estimation, is 55 to 60%. The  left ventricle has normal function. The left ventricle has  no regional  wall motion abnormalities. There is mild left ventricular hypertrophy of  the basal-septal segment. Left  ventricular diastolic parameters were normal.  2. Right ventricular systolic function is normal. The right ventricular  size is normal. There is normal pulmonary artery systolic pressure. The  estimated right ventricular systolic pressure is 09.7 mmHg.  3. Left atrial size was mildly dilated.  4. The mitral valve is normal in structure. Mild to moderate mitral valve  regurgitation. No evidence of mitral stenosis.  5. The aortic valve has been repaired/replaced. Aortic valve  regurgitation is not visualized. No aortic stenosis is present. 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 Procedure  Date: 2018. Echo findings are consistent  with normal structure and function of the aortic valve prosthesis. Aortic  valve mean gradient measures 10.8 mmHg. Aortic valve Vmax measures 2.12  m/s.  6. Aortic root/ascending aorta has been repaired/replaced.  7. The inferior vena cava is normal in size with greater than 50%  respiratory variability, suggesting right atrial pressure of 3 mmHg.   Comparison(s): Prior images unable to be directly viewed, comparison made  by report only.    CT ANGIOGRAPHY CHEST WITH CONTRAST  TECHNIQUE: Multidetector CT imaging of the chest was performed using the standard protocol during bolus administration of intravenous contrast. Multiplanar CT image reconstructions and MIPs were obtained to evaluate the vascular anatomy.  CONTRAST:  78mL ISOVUE-370 IOPAMIDOL (ISOVUE-370) INJECTION 76%  Creatinine was obtained on site at [place] at [address]  Results: Creatinine 1.1 mg/dL.  COMPARISON:  11/17/2018  FINDINGS: Cardiovascular: Previous median sternotomy and aortic valve repair. The heart size appears normal. Mild aortic atherosclerosis.  The aortic root measures  4.2 cm in  diameter, image 86/10. Unchanged from previous exam.  The sino-tubular junction measures 3 cm in diameter, image 79/10. Unchanged.  The ascending thoracic aorta measures 3.9 cm, image 80/10. Unchanged from previous exam.  The transverse aortic arch measures 4.2 cm, image 131/12. Unchanged from previous exam  The descending thoracic aorta measures 2.9 cm, image 131/10. Stable.  No signs of aortic dissection. The great vessels appear widely patent.  The main pulmonary artery is patent. No obstructing pulmonary embolus identified.  Mediastinum/Nodes: Normal appearance of the thyroid gland. The trachea appears patent and is midline. Normal appearance of the esophagus. No supraclavicular, axillary, mediastinal, or hilar lymph nodes.  Lungs/Pleura: No pleural effusion. No airspace consolidation. Posterolateral left upper lobe scar is unchanged.  Upper Abdomen: No acute abnormality. Gallstones. Left upper pole renal calcification measures 1.1 cm.  Musculoskeletal: No acute findings.  Review of the MIP images confirms the above findings.  IMPRESSION: 1. 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 Recommend semi-annual imaging followup by CTA or MRA and referral to cardiothoracic surgery if not already obtained. This recommendation follows 2010 ACCF/AHA/AATS/ACR/ASA/SCA/SCAI/SIR/STS/SVM Guidelines for the Diagnosis and Management of Patients With Thoracic Aortic Disease. Circulation. 2010; 121: J478-G95. Aortic aneurysm NOS (ICD10-I71.9) 2. Status post aortic valve repair. 3. Gallstones. 4. Nonobstructing left renal calculus. 5. Aortic atherosclerosis.  Aortic Atherosclerosis (ICD10-I70.0).   Electronically Signed   By: Kerby Moors M.D.   On: 11/30/2019 10:02  ASSESSMENT:    1. S/P AVR (aortic valve replacement)   2. S/P aortic aneurysm repair   3. Coronary artery disease involving native coronary  artery of native heart without angina pectoris   4. Essential hypertension      PLAN:  In order of problems listed above:  1. H/o Aortic root repair and AVR with a tissue valve.:   Repeat Echo in March 2021 showed good prosthetic valve function. Repeat CT in November showed stable 4.3 cm aneurysm.    2. Recurrent right pleural effusion- resolved.   3. CAD: asymptomatic.  80% mid nondominant RCA and a 50% distal LAD lesion on previous cardiac catheterization.  Continue medical therapy.   4. Hypertension: Blood pressure is under good control.  5. Hyperlipidemia: request results of most recent lab  6.   ITP followed by Dr [name].   7. Osteoarthritis. He is cleared from a cardiac standpoint for surgery.   Medication Adjustments/Labs and Tests Ordered: Current medicines are reviewed at length with the patient today.  Concerns regarding medicines are outlined above.  Medication changes, Labs and Tests ordered today are listed in the Patient Instructions below. Patient Instructions  You are cleared to have hip surgery      Signed, Branden Vine Martinique, MD  03/06/2020 4:10 PM    [facility] [address] [place], [place], [place]  [address] Phone: [phone]; Fax: [phone]

## 2020-03-06 ENCOUNTER — Other Ambulatory Visit: Payer: Self-pay

## 2020-03-06 ENCOUNTER — Ambulatory Visit (INDEPENDENT_AMBULATORY_CARE_PROVIDER_SITE_OTHER): Payer: Medicare Other | Admitting: Cardiology

## 2020-03-06 ENCOUNTER — Encounter: Payer: Self-pay | Admitting: Cardiology

## 2020-03-06 VITALS — BP 128/78 | HR 66 | Ht 69.0 in | Wt 217.6 lb

## 2020-03-06 DIAGNOSIS — I10 Essential (primary) hypertension: Secondary | ICD-10-CM | POA: Diagnosis not present

## 2020-03-06 DIAGNOSIS — Z8679 Personal history of other diseases of the circulatory system: Secondary | ICD-10-CM

## 2020-03-06 DIAGNOSIS — Z952 Presence of prosthetic heart valve: Secondary | ICD-10-CM | POA: Diagnosis not present

## 2020-03-06 DIAGNOSIS — Z9889 Other specified postprocedural states: Secondary | ICD-10-CM | POA: Diagnosis not present

## 2020-03-06 DIAGNOSIS — I2510 Atherosclerotic heart disease of native coronary artery without angina pectoris: Secondary | ICD-10-CM | POA: Diagnosis not present

## 2020-03-06 NOTE — Patient Instructions (Signed)
You are cleared to have hip surgery

## 2020-03-07 DIAGNOSIS — N401 Enlarged prostate with lower urinary tract symptoms: Secondary | ICD-10-CM | POA: Diagnosis not present

## 2020-03-07 DIAGNOSIS — R3912 Poor urinary stream: Secondary | ICD-10-CM | POA: Diagnosis not present

## 2020-03-07 DIAGNOSIS — R3121 Asymptomatic microscopic hematuria: Secondary | ICD-10-CM | POA: Diagnosis not present

## 2020-04-02 DIAGNOSIS — K921 Melena: Secondary | ICD-10-CM | POA: Diagnosis not present

## 2020-04-02 DIAGNOSIS — D500 Iron deficiency anemia secondary to blood loss (chronic): Secondary | ICD-10-CM | POA: Diagnosis not present

## 2020-04-10 DIAGNOSIS — M79672 Pain in left foot: Secondary | ICD-10-CM | POA: Diagnosis not present

## 2020-04-10 DIAGNOSIS — M1812 Unilateral primary osteoarthritis of first carpometacarpal joint, left hand: Secondary | ICD-10-CM | POA: Diagnosis not present

## 2020-04-10 DIAGNOSIS — M79645 Pain in left finger(s): Secondary | ICD-10-CM | POA: Diagnosis not present

## 2020-04-16 DIAGNOSIS — G4733 Obstructive sleep apnea (adult) (pediatric): Secondary | ICD-10-CM | POA: Diagnosis not present

## 2020-04-16 DIAGNOSIS — J309 Allergic rhinitis, unspecified: Secondary | ICD-10-CM | POA: Diagnosis not present

## 2020-05-03 DIAGNOSIS — N200 Calculus of kidney: Secondary | ICD-10-CM | POA: Diagnosis not present

## 2020-05-03 DIAGNOSIS — D693 Immune thrombocytopenic purpura: Secondary | ICD-10-CM | POA: Diagnosis not present

## 2020-05-03 DIAGNOSIS — I714 Abdominal aortic aneurysm, without rupture: Secondary | ICD-10-CM | POA: Diagnosis not present

## 2020-05-03 DIAGNOSIS — K8020 Calculus of gallbladder without cholecystitis without obstruction: Secondary | ICD-10-CM | POA: Diagnosis not present

## 2020-05-03 DIAGNOSIS — Z8719 Personal history of other diseases of the digestive system: Secondary | ICD-10-CM | POA: Diagnosis not present

## 2020-05-03 DIAGNOSIS — R3121 Asymptomatic microscopic hematuria: Secondary | ICD-10-CM | POA: Diagnosis not present

## 2020-05-03 DIAGNOSIS — D500 Iron deficiency anemia secondary to blood loss (chronic): Secondary | ICD-10-CM | POA: Diagnosis not present

## 2020-05-03 DIAGNOSIS — N281 Cyst of kidney, acquired: Secondary | ICD-10-CM | POA: Diagnosis not present

## 2020-05-07 DIAGNOSIS — J309 Allergic rhinitis, unspecified: Secondary | ICD-10-CM | POA: Diagnosis not present

## 2020-05-07 DIAGNOSIS — G4733 Obstructive sleep apnea (adult) (pediatric): Secondary | ICD-10-CM | POA: Diagnosis not present

## 2020-05-07 DIAGNOSIS — J209 Acute bronchitis, unspecified: Secondary | ICD-10-CM | POA: Diagnosis not present

## 2020-05-10 ENCOUNTER — Telehealth: Payer: Self-pay | Admitting: Cardiology

## 2020-05-10 DIAGNOSIS — I712 Thoracic aortic aneurysm, without rupture, unspecified: Secondary | ICD-10-CM

## 2020-05-10 NOTE — Telephone Encounter (Signed)
Pt is calling to ask a question in regards to his recent Urology appt. Please Advise

## 2020-05-10 NOTE — Telephone Encounter (Signed)
Spoke to patient. Informed patient Dr Martinique remarks. RN informed patient that written report is available for Dr Martinique to review once he back in the office. Patient verbalized understanding.

## 2020-05-10 NOTE — Telephone Encounter (Signed)
Returned call to patient who states that he recently had a CT scan at his urologist to evaluate the blood in his urine he has been experiencing he says for 30 years. Patient states that he was told from that CT scan that his Aorta was dilated to 5cm. Patient states that this is a change from previous studies. Advised patient to have [facility] forward CT results to office. Fax number given to patient.   Patient states that his niece would also like to know at how many cm would Aortic Dilation become imminent risk for dissection.   Patient states that he saw his PCP this week and his blood pressure was slightly elevated at 134/100. Patient states he is currently taking all medications as prescribed.   Patient denies any symptoms at this time.   Advised patient to monitor blood pressure, and that I would forward message to Dr. Martinique for him to review and advise.   Patient will have results of CT scan faxed to office. Once received will place in Dr. Morrison Old box.   Patient verbalized understanding of all instructions.

## 2020-05-10 NOTE — Telephone Encounter (Signed)
BP was fine when we saw him in February- needs to monitor. If AAA is 5 cm he needs to be seen by [facility]. Typically we seek to repair when 5.5 cm or greater and would need to see if he is a candidate for stent graft.   Jebidiah Baggerly Martinique MD, [facility]

## 2020-05-14 ENCOUNTER — Encounter: Payer: Self-pay | Admitting: Urology

## 2020-05-14 NOTE — Telephone Encounter (Signed)
Spoke to patient abdominal CT done 05/03/20 with Dr.Matthew Eskridge revealed AAA 5.0 cm.Advised [facility] will be calling with appointment.

## 2020-05-14 NOTE — Addendum Note (Signed)
Addended by: Kathyrn Lass on: 05/14/2020 03:00 PM   Modules accepted: Orders

## 2020-05-15 ENCOUNTER — Telehealth: Payer: Self-pay | Admitting: Cardiology

## 2020-05-15 NOTE — Telephone Encounter (Signed)
Lonnie Hansen is calling in with questions in regards to a urgent referral that was received from [facility].Please advise

## 2020-05-15 NOTE — Telephone Encounter (Signed)
Spoke with shonda, she has gotten the information she needed by dr Martinique. She does not need anything else.

## 2020-05-16 ENCOUNTER — Ambulatory Visit (INDEPENDENT_AMBULATORY_CARE_PROVIDER_SITE_OTHER): Payer: Medicare Other | Admitting: Vascular Surgery

## 2020-05-16 ENCOUNTER — Encounter: Payer: Self-pay | Admitting: Vascular Surgery

## 2020-05-16 ENCOUNTER — Other Ambulatory Visit: Payer: Self-pay

## 2020-05-16 ENCOUNTER — Other Ambulatory Visit: Payer: Self-pay | Admitting: Cardiology

## 2020-05-16 VITALS — BP 149/89 | HR 60 | Temp 97.7°F | Resp 20 | Ht 69.0 in | Wt 218.4 lb

## 2020-05-16 DIAGNOSIS — I723 Aneurysm of iliac artery: Secondary | ICD-10-CM

## 2020-05-16 DIAGNOSIS — I2510 Atherosclerotic heart disease of native coronary artery without angina pectoris: Secondary | ICD-10-CM | POA: Diagnosis not present

## 2020-05-16 DIAGNOSIS — I714 Abdominal aortic aneurysm, without rupture, unspecified: Secondary | ICD-10-CM

## 2020-05-16 NOTE — Progress Notes (Signed)
Referring Physician: Dr. 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 male, with a known arch aortic aneurysm followed by cardiac surgery.  He also has a known abdominal aortic aneurysm.  His aortic aneurysm was 3.8 cm in diameter in 2016.  It was found to have grown recently on a CT scan done for hematuria.  Patient does not have abdominal or back pain.  He apparently has some type of bleeding dyscrasia that made his cardiac surgery in the past very difficult with multiple transfusions.  He is followed by [name] for this.  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  His platelet count was 88 on a CBC from December 2021.  He did have an aortic valve replacement in the past.  He has a history of coronary artery disease.  Other medical problems include hypertension and sleep apnea both of which are controlled.  He is on aspirin and a statin.  Past Medical History:  Diagnosis Date  . AAA (abdominal aortic aneurysm) ([place])   . Anemia   . Arthritis   . Asymptomatic bilateral carotid artery stenosis 08/2015   1-39%   . Blood dyscrasia    "trouble with my bloods clotting"  . Bruising    on the skin states due to platelets or sometimes low or high  . Cataracts, bilateral   . 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 Coronary artery disease   . Diverticulosis   . Enlarged aorta ([place])   . Enlarged prostate    slightly  . GERD (gastroesophageal reflux disease)    takes Pantoprazole daily as needed  . Glaucoma    uses eye drops daily  . Headache   . History of colon polyps    benign  . History of kidney stones   . Hyperlipidemia    no on any meds  . Hypertension    takes Amlodipine and Atenolol daily  . Joint pain   . Joint swelling   . Nocturia   . OSA on CPAP   . Sleep apnea   . Vocal cord nodule    pt. states  it's a"  growth on vocal cord"   Past Surgical History:  Procedure Laterality Date  . AORTIC ARCH ANGIOGRAPHY N/A 04/16/2016   Procedure: Aortic Arch Angiography;  Surgeon: [name] Martinique, MD;  Location: [place] [facility];  Service: Cardiovascular;  Laterality: N/A;  . AORTIC VALVE REPLACEMENT N/A 09/15/2016   Procedure: AORTIC VALVE REPLACEMENT (AVR);  Surgeon: Grace Isaac, MD;  Location: [place];  Service: Open Heart Surgery;  Laterality: N/A;  Using 98mm Edwards Perimount Magna Ease Aortic Bioprosthesis Valve  . ASCENDING AORTIC ROOT REPLACEMENT N/A 09/15/2016   Procedure: ASCENDING AORTIC ROOT REPLACEMENT;  Surgeon: Grace Isaac, MD;  Location: [place];  Service: Open Heart Surgery;  Laterality: N/A;  Using 69mm Gelweave Valsalva Graft  . COLONOSCOPY    . COLONOSCOPY Left 04/16/2019   Procedure: COLONOSCOPY;  Surgeon: Arta Silence, MD;  Location: [facility] ENDOSCOPY;  Service: Endoscopy;  Laterality: Left;  . COLONOSCOPY WITH ESOPHAGOGASTRODUODENOSCOPY (EGD)    . ESOPHAGOGASTRODUODENOSCOPY (EGD) WITH PROPOFOL Left 04/16/2019   Procedure: ESOPHAGOGASTRODUODENOSCOPY (EGD) WITH PROPOFOL;  Surgeon: Arta Silence, MD;  Location: [place];  Service: Endoscopy;  Laterality: Left;  . IR THORACENTESIS ASP PLEURAL SPACE W/IMG GUIDE  10/23/2016  . MICROLARYNGOSCOPY  Right 09/20/2017   Procedure: MICROLARYNGOSCOPY WITH  EXCISION OF VOCAL CORD LESION;  Surgeon: Leta Baptist, MD;  Location: [place];  Service: ENT;  Laterality: Right;  . MULTIPLE EXTRACTIONS WITH ALVEOLOPLASTY N/A 06/10/2016   Procedure: Extraction of tooth #'s 2,8,13,15, and 29  with alveoloplasty, maxillary right and left buccal exostoses reductions, and gross debridement of remaining teeth.;  Surgeon: Lenn Cal, DDS;  Location: [place];  Service: Oral Surgery;  Laterality: N/A;  . RIGHT/LEFT HEART CATH AND CORONARY ANGIOGRAPHY N/A 04/16/2016   Procedure: Right/Left Heart Cath and Coronary Angiography;  Surgeon: [name]  Martinique, MD;  Location: [place] [facility];  Service: Cardiovascular;  Laterality: N/A;  . TEE WITHOUT CARDIOVERSION N/A 09/15/2016   Procedure: TRANSESOPHAGEAL ECHOCARDIOGRAM (TEE);  Surgeon: Grace Isaac, MD;  Location: [place];  Service: Open Heart Surgery;  Laterality: N/A;    Family History  Problem Relation Age of Onset  . Heart disease Father   . Heart attack Father   . Thyroid disease Sister     SOCIAL HISTORY: Social History   Socioeconomic History  . Marital status: Married    Spouse name: Not on file  . Number of children: 1  . Years of education: Not on file  . Highest education level: Not on file  Occupational History  . Occupation: Musician  Tobacco Use  . Smoking status: Former Smoker    Packs/day: 1.00    Years: 25.00    Pack years: 25.00    Types: Cigarettes  . Smokeless tobacco: Never Used  Vaping Use  . Vaping Use: Never used  Substance and Sexual Activity  . Alcohol use: No    Alcohol/week: 0.0 standard drinks  . Drug use: No  . Sexual activity: Not on file  Other Topics Concern  . 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   Social Determinants of Health   Financial Resource Strain: Not on file  Food Insecurity: Not on file  Transportation Needs: Not on file  Physical Activity: Not on file  Stress: Not on file  Social Connections: Not on file  Intimate Partner Violence: Not on file    No Known Allergies  Current Outpatient Medications  Medication Sig Dispense Refill  . amLODipine (NORVASC) 10 MG tablet Take 10 mg by mouth daily.    Marland Kitchen aspirin EC 81 MG EC tablet Take 1 tablet (81 mg total) by mouth daily.    Marland Kitchen atenolol (TENORMIN) 25 MG tablet Take 0.5 tablets (12.5 mg total) by mouth 2 (two) times daily. 30 tablet 1  . Ensure Plus (ENSURE PLUS) LIQD Take 237 mLs by mouth.    . fluticasone (FLONASE) 50 MCG/ACT nasal spray 1 spray in each nostril    . latanoprost (XALATAN) 0.005 %  ophthalmic solution Place 1 drop into both eyes at bedtime.    . Multiple Vitamin (MULTIVITAMIN WITH MINERALS) TABS tablet Take 1 tablet by mouth daily.    Marland Kitchen omega-3 acid ethyl esters (LOVAZA) 1 g capsule Take 2 capsules (2 g total) by mouth 2 (two) times daily. 60 capsule 3  . pantoprazole (PROTONIX) 40 MG tablet Take 40 mg by mouth daily.     . rosuvastatin (CRESTOR) 10 MG tablet Take 1 tablet by mouth once daily 90 tablet 0  . tamsulosin (FLOMAX) 0.4 MG CAPS capsule Take 1 capsule by mouth once daily (to improve urine flow)  No current facility-administered medications for this visit.    ROS:   General:  No weight loss, Fever, chills  HEENT: No recent headaches, no nasal bleeding, no visual changes, no sore throat  Neurologic: No dizziness, blackouts, seizures. No recent symptoms of stroke or mini- stroke. No recent episodes of slurred speech, or temporary blindness.  Cardiac: No recent episodes of chest pain/pressure, no shortness of breath at rest.  No shortness of breath with exertion.  Denies history of atrial fibrillation or irregular heartbeat  Vascular: No history of rest pain in feet.  No history of claudication.  No history of non-healing ulcer, No history of DVT   Pulmonary: No home oxygen, no productive cough, no hemoptysis,  No asthma or wheezing  Musculoskeletal:  [X]  Arthritis, [ ]  Low back pain,  [X]  Joint pain  Hematologic:No history of hypercoagulable state.  No history of easy bleeding.  No history of anemia  Gastrointestinal: No hematochezia or melena,  No gastroesophageal reflux, no trouble swallowing  Urinary: [ ]  chronic Kidney disease, [ ]  on HD - [ ]  MWF or [ ]  TTHS, [ ]  Burning with urination, [ ]  Frequent urination, [ ]  Difficulty urinating;   Skin: No rashes  Psychological: No history of anxiety,  No history of depression   Physical Examination  Vitals:   05/16/20 1320  BP: (!) 149/89  Pulse: 60  Resp: 20  Temp: 97.7 F (36.5 C)  SpO2:  96%  Weight: 218 lb 6.4 oz (99.1 kg)  Height: 5\' 9"  (1.753 m)    Body mass index is 32.25 kg/m.  General:  Alert and oriented, no acute distress HEENT: Normal Neck: No JVD Cardiac: Regular Rate and Rhythm Abdomen: Soft, non-tender, non-distended, no mass, obese no palpable pulsatile mass Skin: No rash Extremity Pulses:  2+ radial, brachial, femoral, dorsalis pedis pulses bilaterally Musculoskeletal: No deformity or edema  Neurologic: Upper and lower extremity motor 5/5 and symmetric  DATA:  I reviewed the patient's recent CT scan from [facility] which shows a 5 cm infrarenal abdominal aortic aneurysm with some tortuosity of the neck the neck length is greater than 2 cm.  Infrarenal portion of the aneurysm is 5 cm diameter.  Right internal iliac artery aneurysm 2.8 cm.  Left internal iliac artery aneurysm 3 cm.  ASSESSMENT: Patient with an asymptomatic 5 cm infrarenal abdominal aortic aneurysm.  He also has bilateral internal iliac artery aneurysms.  Consideration for repair would be at 5-1/2 cm diameter.  We also discussed that if he has stent graft repair he would have coil embolization of both internal iliac arteries for aneurysms as well.  For isolated internal iliac artery aneurysm we would consider repair at 3-1/2 to 4 cm diameter.  I also discussed with him the possibility of erectile dysfunction with coil embolization of both internal iliac arteries.  This would also be a risk if he had open repair.  This was quite concerning to him.  Also be risk of buttock claudication.  We discussed risk of rupture of an aneurysm less than 5-1/2 cm in diameter to be less than 5%.   PLAN: Patient will have a follow-up ultrasound of his abdominal aorta in 6 months time.  Consideration for repair if this reaches greater than 5 and half centimeter diameter.   Ruta Hinds, MD [facility] Office: [phone]

## 2020-05-24 ENCOUNTER — Other Ambulatory Visit: Payer: Self-pay

## 2020-05-24 ENCOUNTER — Emergency Department (HOSPITAL_COMMUNITY): Payer: Medicare Other

## 2020-05-24 ENCOUNTER — Encounter (HOSPITAL_COMMUNITY): Payer: Self-pay

## 2020-05-24 ENCOUNTER — Inpatient Hospital Stay (HOSPITAL_COMMUNITY): Payer: Medicare Other

## 2020-05-24 ENCOUNTER — Inpatient Hospital Stay (HOSPITAL_COMMUNITY)
Admission: EM | Admit: 2020-05-24 | Discharge: 2020-05-29 | DRG: 377 | Disposition: A | Discharge status: Home / Self Care | Payer: Medicare Other | Attending: Internal Medicine | Admitting: Internal Medicine

## 2020-05-24 DIAGNOSIS — I469 Cardiac arrest, cause unspecified: Secondary | ICD-10-CM

## 2020-05-24 DIAGNOSIS — Z452 Encounter for adjustment and management of vascular access device: Secondary | ICD-10-CM

## 2020-05-24 DIAGNOSIS — Z87891 Personal history of nicotine dependence: Secondary | ICD-10-CM | POA: Diagnosis not present

## 2020-05-24 DIAGNOSIS — K5791 Diverticulosis of intestine, part unspecified, without perforation or abscess with bleeding: Secondary | ICD-10-CM

## 2020-05-24 DIAGNOSIS — K649 Unspecified hemorrhoids: Secondary | ICD-10-CM | POA: Diagnosis not present

## 2020-05-24 DIAGNOSIS — I714 Abdominal aortic aneurysm, without rupture: Secondary | ICD-10-CM | POA: Diagnosis present

## 2020-05-24 DIAGNOSIS — K625 Hemorrhage of anus and rectum: Secondary | ICD-10-CM | POA: Diagnosis not present

## 2020-05-24 DIAGNOSIS — D62 Acute posthemorrhagic anemia: Secondary | ICD-10-CM

## 2020-05-24 DIAGNOSIS — K922 Gastrointestinal hemorrhage, unspecified: Secondary | ICD-10-CM

## 2020-05-24 DIAGNOSIS — D649 Anemia, unspecified: Secondary | ICD-10-CM | POA: Diagnosis not present

## 2020-05-24 DIAGNOSIS — Z7982 Long term (current) use of aspirin: Secondary | ICD-10-CM

## 2020-05-24 DIAGNOSIS — K7460 Unspecified cirrhosis of liver: Secondary | ICD-10-CM | POA: Diagnosis not present

## 2020-05-24 DIAGNOSIS — K5751 Diverticulosis of both small and large intestine without perforation or abscess with bleeding: Principal | ICD-10-CM | POA: Diagnosis present

## 2020-05-24 DIAGNOSIS — K921 Melena: Secondary | ICD-10-CM | POA: Diagnosis not present

## 2020-05-24 DIAGNOSIS — N400 Enlarged prostate without lower urinary tract symptoms: Secondary | ICD-10-CM | POA: Diagnosis present

## 2020-05-24 DIAGNOSIS — I10 Essential (primary) hypertension: Secondary | ICD-10-CM | POA: Diagnosis present

## 2020-05-24 DIAGNOSIS — E785 Hyperlipidemia, unspecified: Secondary | ICD-10-CM | POA: Diagnosis present

## 2020-05-24 DIAGNOSIS — R451 Restlessness and agitation: Secondary | ICD-10-CM | POA: Diagnosis not present

## 2020-05-24 DIAGNOSIS — K8080 Other cholelithiasis without obstruction: Secondary | ICD-10-CM | POA: Diagnosis not present

## 2020-05-24 DIAGNOSIS — K2970 Gastritis, unspecified, without bleeding: Secondary | ICD-10-CM | POA: Diagnosis not present

## 2020-05-24 DIAGNOSIS — K7581 Nonalcoholic steatohepatitis (NASH): Secondary | ICD-10-CM | POA: Diagnosis not present

## 2020-05-24 DIAGNOSIS — D72828 Other elevated white blood cell count: Secondary | ICD-10-CM | POA: Diagnosis present

## 2020-05-24 DIAGNOSIS — Z20822 Contact with and (suspected) exposure to covid-19: Secondary | ICD-10-CM | POA: Diagnosis present

## 2020-05-24 DIAGNOSIS — K5710 Diverticulosis of small intestine without perforation or abscess without bleeding: Secondary | ICD-10-CM | POA: Diagnosis not present

## 2020-05-24 DIAGNOSIS — R55 Syncope and collapse: Secondary | ICD-10-CM | POA: Diagnosis not present

## 2020-05-24 DIAGNOSIS — Z4682 Encounter for fitting and adjustment of non-vascular catheter: Secondary | ICD-10-CM | POA: Diagnosis not present

## 2020-05-24 DIAGNOSIS — I468 Cardiac arrest due to other underlying condition: Secondary | ICD-10-CM | POA: Diagnosis not present

## 2020-05-24 DIAGNOSIS — Z79899 Other long term (current) drug therapy: Secondary | ICD-10-CM | POA: Diagnosis not present

## 2020-05-24 DIAGNOSIS — K219 Gastro-esophageal reflux disease without esophagitis: Secondary | ICD-10-CM | POA: Diagnosis not present

## 2020-05-24 DIAGNOSIS — G4733 Obstructive sleep apnea (adult) (pediatric): Secondary | ICD-10-CM | POA: Diagnosis present

## 2020-05-24 DIAGNOSIS — Z952 Presence of prosthetic heart valve: Secondary | ICD-10-CM | POA: Diagnosis not present

## 2020-05-24 DIAGNOSIS — I2510 Atherosclerotic heart disease of native coronary artery without angina pectoris: Secondary | ICD-10-CM | POA: Diagnosis present

## 2020-05-24 DIAGNOSIS — R578 Other shock: Secondary | ICD-10-CM | POA: Diagnosis not present

## 2020-05-24 DIAGNOSIS — K5731 Diverticulosis of large intestine without perforation or abscess with bleeding: Secondary | ICD-10-CM | POA: Diagnosis not present

## 2020-05-24 DIAGNOSIS — D693 Immune thrombocytopenic purpura: Secondary | ICD-10-CM | POA: Diagnosis not present

## 2020-05-24 DIAGNOSIS — J9811 Atelectasis: Secondary | ICD-10-CM | POA: Diagnosis not present

## 2020-05-24 DIAGNOSIS — E876 Hypokalemia: Secondary | ICD-10-CM | POA: Diagnosis present

## 2020-05-24 DIAGNOSIS — Z9989 Dependence on other enabling machines and devices: Secondary | ICD-10-CM

## 2020-05-24 DIAGNOSIS — I712 Thoracic aortic aneurysm, without rupture: Secondary | ICD-10-CM | POA: Diagnosis present

## 2020-05-24 DIAGNOSIS — N281 Cyst of kidney, acquired: Secondary | ICD-10-CM | POA: Diagnosis not present

## 2020-05-24 DIAGNOSIS — N179 Acute kidney failure, unspecified: Secondary | ICD-10-CM | POA: Diagnosis present

## 2020-05-24 DIAGNOSIS — K5730 Diverticulosis of large intestine without perforation or abscess without bleeding: Secondary | ICD-10-CM | POA: Diagnosis not present

## 2020-05-24 DIAGNOSIS — K3189 Other diseases of stomach and duodenum: Secondary | ICD-10-CM | POA: Diagnosis not present

## 2020-05-24 LAB — COMPREHENSIVE METABOLIC PANEL
ALT: 13 U/L (ref 0–44)
AST: 22 U/L (ref 15–41)
Albumin: 4 g/dL (ref 3.5–5.0)
Alkaline Phosphatase: 39 U/L (ref 38–126)
Anion gap: 10 (ref 5–15)
BUN: 18 mg/dL (ref 8–23)
CO2: 22 mmol/L (ref 22–32)
Calcium: 9.1 mg/dL (ref 8.9–10.3)
Chloride: 105 mmol/L (ref 98–111)
Creatinine, Ser: 1.54 mg/dL — ABNORMAL HIGH (ref 0.61–1.24)
GFR, Estimated: 48 mL/min — ABNORMAL LOW (ref >60)
Glucose, Bld: 149 mg/dL — ABNORMAL HIGH (ref 70–99)
Potassium: 3.6 mmol/L (ref 3.5–5.1)
Sodium: 137 mmol/L (ref 135–145)
Total Bilirubin: 0.6 mg/dL (ref 0.3–1.2)
Total Protein: 6.8 g/dL (ref 6.5–8.1)

## 2020-05-24 LAB — CBC WITH DIFFERENTIAL/PLATELET
Abs Immature Granulocytes: 0.08 10*3/uL — ABNORMAL HIGH (ref 0.00–0.07)
Basophils Absolute: 0 10*3/uL (ref 0.0–0.1)
Basophils Relative: 0 %
Eosinophils Absolute: 0.1 10*3/uL (ref 0.0–0.5)
Eosinophils Relative: 1 %
HCT: 37.8 % — ABNORMAL LOW (ref 39.0–52.0)
Hemoglobin: 12.1 g/dL — ABNORMAL LOW (ref 13.0–17.0)
Immature Granulocytes: 1 %
Lymphocytes Relative: 11 %
Lymphs Abs: 1.6 10*3/uL (ref 0.7–4.0)
MCH: 28 pg (ref 26.0–34.0)
MCHC: 32 g/dL (ref 30.0–36.0)
MCV: 87.5 fL (ref 80.0–100.0)
Monocytes Absolute: 1.2 10*3/uL — ABNORMAL HIGH (ref 0.1–1.0)
Monocytes Relative: 8 %
Neutro Abs: 11.9 10*3/uL — ABNORMAL HIGH (ref 1.7–7.7)
Neutrophils Relative %: 79 %
Platelets: 97 10*3/uL — ABNORMAL LOW (ref 150–400)
RBC: 4.32 MIL/uL (ref 4.22–5.81)
RDW: 18.9 % — ABNORMAL HIGH (ref 11.5–15.5)
WBC: 14.8 10*3/uL — ABNORMAL HIGH (ref 4.0–10.5)
nRBC: 0 % (ref 0.0–0.2)

## 2020-05-24 LAB — RESP PANEL BY RT-PCR (FLU A&B, COVID) ARPGX2
Influenza A by PCR: NEGATIVE
Influenza B by PCR: NEGATIVE
SARS Coronavirus 2 by RT PCR: NEGATIVE

## 2020-05-24 LAB — CBC
HCT: 31.2 % — ABNORMAL LOW (ref 39.0–52.0)
Hemoglobin: 10.1 g/dL — ABNORMAL LOW (ref 13.0–17.0)
MCH: 29.1 pg (ref 26.0–34.0)
MCHC: 32.4 g/dL (ref 30.0–36.0)
MCV: 89.9 fL (ref 80.0–100.0)
Platelets: 67 10*3/uL — ABNORMAL LOW (ref 150–400)
RBC: 3.47 MIL/uL — ABNORMAL LOW (ref 4.22–5.81)
RDW: 18 % — ABNORMAL HIGH (ref 11.5–15.5)
WBC: 12.8 10*3/uL — ABNORMAL HIGH (ref 4.0–10.5)
nRBC: 0 % (ref 0.0–0.2)

## 2020-05-24 LAB — PREPARE RBC (CROSSMATCH)

## 2020-05-24 LAB — PROTIME-INR
INR: 1.1 (ref 0.8–1.2)
Prothrombin Time: 14.1 seconds (ref 11.4–15.2)

## 2020-05-24 MED ORDER — SODIUM CHLORIDE 0.9% FLUSH
10.0000 mL | Freq: Two times a day (BID) | INTRAVENOUS | Status: DC
  Administered 2020-05-25 – 2020-05-27 (×5): 10 mL

## 2020-05-24 MED ORDER — SODIUM CHLORIDE 0.9 % IV SOLN: INTRAVENOUS | Status: DC

## 2020-05-24 MED ORDER — PANTOPRAZOLE SODIUM 40 MG IV SOLR: 40.0000 mg | Freq: Once | INTRAVENOUS | Status: DC

## 2020-05-24 MED ORDER — CHLORHEXIDINE GLUCONATE CLOTH 2 % EX PADS
6.0000 | MEDICATED_PAD | Freq: Every day | CUTANEOUS | Status: DC
  Administered 2020-05-25 – 2020-05-27 (×3): 6 via TOPICAL

## 2020-05-24 MED ORDER — SODIUM CHLORIDE 0.9 % IV BOLUS
1000.0000 mL | Freq: Once | INTRAVENOUS | Status: AC
  Administered 2020-05-24: 1000 mL via INTRAVENOUS

## 2020-05-24 MED ORDER — SODIUM CHLORIDE 0.9 % IV SOLN
10.0000 mL/h | Freq: Once | INTRAVENOUS | Status: AC
  Administered 2020-05-25: 10 mL/h via INTRAVENOUS

## 2020-05-24 MED ORDER — SODIUM CHLORIDE 0.9 % IV SOLN
8.0000 mg/h | INTRAVENOUS | Status: DC
  Filled 2020-05-24: qty 80

## 2020-05-24 MED ORDER — NOREPINEPHRINE 4 MG/250ML-% IV SOLN
0.0000 ug/min | INTRAVENOUS | Status: DC
  Administered 2020-05-24: 2 ug/min via INTRAVENOUS
  Administered 2020-05-25: 5 ug/min via INTRAVENOUS
  Filled 2020-05-24 (×3): qty 250

## 2020-05-24 MED ORDER — SODIUM CHLORIDE 0.9% IV SOLUTION: Freq: Once | INTRAVENOUS | Status: AC

## 2020-05-24 MED ORDER — PANTOPRAZOLE SODIUM 40 MG IV SOLR: 40.0000 mg | Freq: Two times a day (BID) | INTRAVENOUS | Status: DC

## 2020-05-24 MED ORDER — SODIUM CHLORIDE 0.9% FLUSH: 10.0000 mL | INTRAVENOUS | Status: DC | PRN

## 2020-05-24 MED ORDER — SODIUM CHLORIDE 0.9 % IV SOLN
80.0000 mg | Freq: Once | INTRAVENOUS | Status: DC
  Filled 2020-05-24: qty 80

## 2020-05-24 NOTE — Procedures (Signed)
Central Venous Catheter Insertion Procedure Note  KIA STAVROS  [id number]  [birth year]  Date:05/24/20  Time:11:35 PM   Provider Performing:Osaze Hubbert W He[place]   Procedure: Insertion of Non-tunneled Central Venous [phone]) with US guidance ([id number])   Indication(s) Medication administration and Difficult access  Consent Risks of the procedure as well as the alternatives and risks of each were explained to the patient and/or caregiver.  Consent for the procedure was obtained and is signed in the bedside chart  Anesthesia Topical only with 1% lidocaine   Timeout Verified patient identification, verified procedure, site/side was marked, verified correct patient position, special equipment/implants available, medications/allergies/relevant history reviewed, required imaging and test results available.  Sterile Technique Maximal sterile technique including full sterile barrier drape, hand hygiene, sterile gown, sterile gloves, mask, hair covering, sterile ultrasound probe cover (if used).  Procedure Description Area of catheter insertion was cleaned with chlorhexidine and draped in sterile fashion.  With real-time ultrasound guidance a introducer sheath was placed into the right internal jugular vein. Nonpulsatile blood flow and easy flushing noted in all ports.  The catheter was sutured in place and sterile dressing applied.  Complications/Tolerance None; patient tolerated the procedure well. Chest X-ray is ordered to verify placement for internal jugular or subclavian cannulation.   Chest x-ray is not ordered for femoral cannulation.  EBL Minimal  Specimen(s) None   Georgann Housekeeper, AGACNP-BC [facility]  See Amion for personal pager PCCM on call pager [phone] until 7pm. Please call Elink 7p-7a. [phone]  05/24/2020 11:35 PM

## 2020-05-24 NOTE — ED Notes (Addendum)
ICU provider at bedside

## 2020-05-24 NOTE — H&P (Incomplete)
NAME:  Lonnie Hansen, MRN:  [id number], DOB:  [birth year], LOS: 0 ADMISSION DATE:  05/24/2020, CONSULTATION DATE: 4/29 REFERRING MD: Dr. Gilford Hansen EDP, CHIEF COMPLAINT: Gastrointestinal hemorrhage  History of Present Illness:  70 year old male with past medical history as below, which is significant for thoracic and abdominal aortic aneurysm, obstructive sleep apnea, ITP, coronary artery disease, and hypertension.  He presented [facility] department in the evening hours of 4/29 with complaints of bright red blood per rectum starting earlier that day with associated abdominal cramping.  He had an admission to [facility] in March of last year with similar complaints and was found to have multiple diverticula on colonoscopy, however, no obvious bleeding at the time of examination.  He also notes a history of ulcerative colitis, although, I do not this in his medical record.   Upon arrival to the emergency department he had 2 near syncopal episodes in the waiting room and was brought emergently back to a room.  He reports no alcohol use but does take approximately 500 mg of aspirin daily for arthritic hip pain.  He would have ongoing episodes of abdominal cramping followed by bright red blood per rectum.  He would occasionally have subsequent drops in blood pressure and heart rate to the 29J systolic and 24Q respectively.  He was given 4 L of crystalloids and transfused 2 units of PRBC in the emergency department prompting PCCM consultation.  Pertinent  Medical History   has a past medical history of AAA (abdominal aortic aneurysm) ([place]), Anemia, Arthritis, Asymptomatic bilateral carotid artery stenosis (08/2015), Blood dyscrasia, Bruising, Cataracts, bilateral, Chronic ITP (idiopathic thrombocytopenia) ([place]) (03/31/2018), Coronary artery disease, Diverticulosis, Enlarged aorta ([place]), Enlarged prostate, GERD (gastroesophageal reflux disease), Glaucoma, Headache, History of colon polyps,  History of kidney stones, Hyperlipidemia, Hypertension, Joint pain, Joint swelling, Nocturia, OSA on CPAP, Sleep apnea, and Vocal cord nodule.   Significant Hospital Events: Including procedures, antibiotic start and stop dates in addition to other pertinent events   . 4/29 admit to ICU for GI bleeding . 4/29 right IJ cordis/TLC >>> . 4/29 colonoscopy  Interim History / Subjective:    Objective   Blood pressure 90/73, pulse (!) 58, temperature 98.5 F (36.9 C), temperature source Oral, resp. rate 20, height 5\' 9"  (1.753 m), weight 97.5 kg, SpO2 100 %.        Intake/Output Summary (Last 24 hours) at 05/24/2020 2338 Last data filed at 05/24/2020 2235 Gross per 24 hour  Intake 3815 ml  Output -  Net 3815 ml   Filed Weights   05/24/20 1932  Weight: 97.5 kg    Examination: General: Acutely ill appearing elderly male HENT: Normocephalic, atraumatic, PERRL, no appreciable JVD Lungs: Clear bilateral breath sounds Cardiovascular: Regular rate and rhythm Abdomen: Soft, nontender, nondistended.  Reducible umbilical hernia. Extremities: No acute deformities or range of motion rotation.  No edema Neuro: Alert, oriented, nonfocal   Labs/imaging that I have personally reviewed  (right click and "Reselect all SmartList Selections" daily)  Creatinine 1.54 WBC 14.8 Hemoglobin 12.1 Hematocrit 37.8 COVID-negative  Resolved Hospital Problem list     Assessment & Plan:   Hemorrhagic shock: secondary to acute lower GI bleed.  - Transfuse PRBC as needed for hemoglobin less than 8 in the setting of active bleed or at any sign of hemodynamic instability.  - Dr. Michail Hansen to see at bedside, planning for endoscopy - Protonix infusion - May need CTA and IR evaluation  - CBC every 6 hours - Levophed  PRN to keep MAP > 29mmHg - holding home aspirin  AKI - provide hemodynamic support and follow BMP  OSA on CPAP - QHS CPAP once clinically stable  CAD HTN Thoracic and AAA - Holding  outpatient antihypertensives while hypotensive - holding home rosuvastatin while NPO  ITP: platelets 97K on admission Leukocytosis: likely reactive - follow CBC - No need to consider platelet transfusion at this time   Best practice (right click and "Reselect all SmartList Selections" daily)  Diet:  NPO Pain/Anxiety/Delirium protocol (if indicated): No VAP protocol (if indicated): Not indicated DVT prophylaxis: SCD and Contraindicated GI prophylaxis: PPI  Glucose control:  SSI No Central venous access:  Yes, and it is still needed Arterial line:  N/A Foley:  N/A Mobility:  bed rest  PT consulted: N/A Last date of multidisciplinary goals of care discussion [ ]  Code Status:  full code Disposition: ICU  Labs   CBC: Recent Labs  Lab 05/24/20 2007  WBC 14.8*  NEUTROABS 11.9*  HGB 12.1*  HCT 37.8*  MCV 87.5  PLT 97*    Basic Metabolic Panel: Recent Labs  Lab 05/24/20 2007  NA 137  K 3.6  CL 105  CO2 22  GLUCOSE 149*  BUN 18  CREATININE 1.54*  CALCIUM 9.1   GFR: Estimated Creatinine Clearance: 51.4 mL/min (A) (by C-G formula based on SCr of 1.54 mg/dL (H)). Recent Labs  Lab 05/24/20 2007  WBC 14.8*    Liver Function Tests: Recent Labs  Lab 05/24/20 2007  AST 22  ALT 13  ALKPHOS 39  BILITOT 0.6  PROT 6.8  ALBUMIN 4.0   No results for input(s): LIPASE, AMYLASE in the last 168 hours. No results for input(s): AMMONIA in the last 168 hours.  ABG    Component Value Date/Time   PHART 7.483 (H) 09/17/2016 1452   PCO2ART 35.6 09/17/2016 1452   PO2ART 63.0 (L) 09/17/2016 1452   HCO3 26.6 09/17/2016 1452   TCO2 28 09/17/2016 1452   ACIDBASEDEF 1.0 09/16/2016 1048   O2SAT 59.9 09/20/2016 0355     Coagulation Profile: Recent Labs  Lab 05/24/20 2007  INR 1.1    Cardiac Enzymes: No results for input(s): CKTOTAL, CKMB, CKMBINDEX, TROPONINI in the last 168 hours.  HbA1C: Hgb A1c MFr Bld  Date/Time Value Ref Range Status  09/11/2016 10:48 AM  5.3 4.8 - 5.6 % Final    Comment:    (NOTE) Pre diabetes:          5.7%-6.4% Diabetes:              >6.4% Glycemic control for   <7.0% adults with diabetes   04/28/2010 07:03 AM  <5.7 % Final   5.6 (NOTE)                                                                       According to the ADA Clinical Practice Recommendations for 2011, when HbA1c is used as a screening test:   >=6.5%   Diagnostic of Diabetes Mellitus           (if abnormal result  is confirmed)  5.7-6.4%   Increased risk of developing Diabetes Mellitus  References:Diagnosis and Classification of Diabetes Mellitus,Diabetes VOHY,0737,10(GYIRS 1):S62-S69 and Standards of Medical Care  in         Diabetes - 2011,Diabetes Care,2011,34  (Suppl 1):S11-S61.    CBG: No results for input(s): GLUCAP in the last 168 hours.  Review of Systems:   Bolds are positive  Constitutional: weight loss, gain, night sweats, Fevers, chills, fatigue .  HEENT: headaches, Sore throat, sneezing, nasal congestion, post nasal drip, Difficulty swallowing, Tooth/dental problems, visual complaints visual changes, ear ache CV:  chest pain, radiates:***,Orthopnea, PND, swelling in lower extremities**, dizziness, palpitations, syncope.  GI  heartburn, indigestion, abdominal pain, nausea, vomiting, diarrhea, change in bowel habits, loss of appetite, bloody stools.  Resp: cough, productive:*** , hemoptysis, dyspnea***, chest pain, pleuritic.  Skin: rash or itching or icterus GU: dysuria, change in color of urine, urgency or frequency. flank pain, hematuria  MS: joint pain or swelling. decreased range of motion  Psych: change in mood or affect. depression or anxiety.  Neuro: difficulty with speech, weakness, numbness, ataxia    Past Medical History:  He,  has a past medical history of AAA (abdominal aortic aneurysm) ([place]), Anemia, Arthritis, Asymptomatic bilateral carotid artery stenosis (08/2015), Blood dyscrasia, Bruising, Cataracts, bilateral, Chronic  ITP (idiopathic thrombocytopenia) ([place]) (03/31/2018), Coronary artery disease, Diverticulosis, Enlarged aorta ([place]), Enlarged prostate, GERD (gastroesophageal reflux disease), Glaucoma, Headache, History of colon polyps, History of kidney stones, Hyperlipidemia, Hypertension, Joint pain, Joint swelling, Nocturia, OSA on CPAP, Sleep apnea, and Vocal cord nodule.   Surgical History:   Past Surgical History:  Procedure Laterality Date  . AORTIC ARCH ANGIOGRAPHY N/A 04/16/2016   Procedure: Aortic Arch Angiography;  Surgeon: [name] Martinique, MD;  Location: [place] [facility];  Service: Cardiovascular;  Laterality: N/A;  . AORTIC VALVE REPLACEMENT N/A 09/15/2016   Procedure: AORTIC VALVE REPLACEMENT (AVR);  Surgeon: Grace Isaac, MD;  Location: [place];  Service: Open Heart Surgery;  Laterality: N/A;  Using 43mm Edwards Perimount Magna Ease Aortic Bioprosthesis Valve  . ASCENDING AORTIC ROOT REPLACEMENT N/A 09/15/2016   Procedure: ASCENDING AORTIC ROOT REPLACEMENT;  Surgeon: Grace Isaac, MD;  Location: [place];  Service: Open Heart Surgery;  Laterality: N/A;  Using 55mm Gelweave Valsalva Graft  . COLONOSCOPY    . COLONOSCOPY Left 04/16/2019   Procedure: COLONOSCOPY;  Surgeon: Arta Silence, MD;  Location: [facility] ENDOSCOPY;  Service: Endoscopy;  Laterality: Left;  . COLONOSCOPY WITH ESOPHAGOGASTRODUODENOSCOPY (EGD)    . ESOPHAGOGASTRODUODENOSCOPY (EGD) WITH PROPOFOL Left 04/16/2019   Procedure: ESOPHAGOGASTRODUODENOSCOPY (EGD) WITH PROPOFOL;  Surgeon: Arta Silence, MD;  Location: [place];  Service: Endoscopy;  Laterality: Left;  . IR THORACENTESIS ASP PLEURAL SPACE W/IMG GUIDE  10/23/2016  . MICROLARYNGOSCOPY Right 09/20/2017   Procedure: MICROLARYNGOSCOPY WITH  EXCISION OF VOCAL CORD LESION;  Surgeon: Leta Baptist, MD;  Location: [place];  Service: ENT;  Laterality: Right;  . MULTIPLE EXTRACTIONS WITH ALVEOLOPLASTY N/A 06/10/2016   Procedure: Extraction of tooth #'s 2,8,13,15, and  29  with alveoloplasty, maxillary right and left buccal exostoses reductions, and gross debridement of remaining teeth.;  Surgeon: Lenn Cal, DDS;  Location: [place];  Service: Oral Surgery;  Laterality: N/A;  . RIGHT/LEFT HEART CATH AND CORONARY ANGIOGRAPHY N/A 04/16/2016   Procedure: Right/Left Heart Cath and Coronary Angiography;  Surgeon: [name] Martinique, MD;  Location: [place] [facility];  Service: Cardiovascular;  Laterality: N/A;  . TEE WITHOUT CARDIOVERSION N/A 09/15/2016   Procedure: TRANSESOPHAGEAL ECHOCARDIOGRAM (TEE);  Surgeon: Grace Isaac, MD;  Location: [place];  Service: Open Heart Surgery;  Laterality: N/A;  Social History:   reports that he has quit smoking. His smoking use included cigarettes. He has a 25.00 pack-year smoking history. He has never used smokeless tobacco. He reports that he does not drink alcohol and does not use drugs.   Family History:  His family history includes Heart attack in his father; Heart disease in his father; Thyroid disease in his sister.   Allergies No Known Allergies   Home Medications  Prior to Admission medications   Medication Sig Start Date End Date Taking? Authorizing Provider  amLODipine (NORVASC) 10 MG tablet Take 10 mg by mouth daily.    [provider]  aspirin EC 81 MG EC tablet Take 1 tablet (81 mg total) by mouth daily. 09/24/16   Gold, Wayne E, PA-C  atenolol (TENORMIN) 25 MG tablet Take 0.5 tablets (12.5 mg total) by mouth 2 (two) times daily. 09/23/16   Gold, Patrick Jupiter E, PA-C  Ensure Plus (ENSURE PLUS) LIQD Take 237 mLs by mouth.    [provider]  fluticasone (FLONASE) 50 MCG/ACT nasal spray 1 spray in each nostril 04/16/20   [provider]  latanoprost (XALATAN) 0.005 % ophthalmic solution Place 1 drop into both eyes at bedtime.    [provider]  Multiple Vitamin (MULTIVITAMIN WITH MINERALS) TABS tablet Take 1 tablet by mouth daily.    [provider]  omega-3 acid ethyl  esters (LOVAZA) 1 g capsule Take 2 capsules (2 g total) by mouth 2 (two) times daily. 05/12/19   Deberah Pelton, NP  pantoprazole (PROTONIX) 40 MG tablet Take 40 mg by mouth daily.  03/11/19   [provider]  rosuvastatin (CRESTOR) 10 MG tablet Take 1 tablet by mouth once daily 05/17/20   Martinique, [name], MD  tamsulosin [facility]) 0.4 MG CAPS capsule Take 1 capsule by mouth once daily (to improve urine flow) 12/11/13   [provider]     Critical care time: 65 minutes     Georgann Housekeeper, AGACNP-BC [place] for personal pager PCCM on call pager [phone] until 7pm. Please call Elink 7p-7a. [phone]  05/24/2020 11:58 PM

## 2020-05-24 NOTE — H&P (Signed)
NAME:  Lonnie Hansen, MRN:  [id number], DOB:  12-20-1950, LOS: 0 ADMISSION DATE:  05/24/2020, CONSULTATION DATE: 4/29 REFERRING MD: Dr. Gilford Raid EDP, CHIEF COMPLAINT: Gastrointestinal hemorrhage  History of Present Illness:  70 year old male with past medical history as below, which is significant for thoracic and abdominal aortic aneurysm, obstructive sleep apnea, ITP, coronary artery disease, and hypertension.  He presented [name] emergency department in the evening hours of 4/29 with complaints of bright red blood per rectum starting earlier that day with associated abdominal cramping.  He had an admission to [facility] in March of last year with similar complaints and was found to have multiple diverticula on colonoscopy, however, no obvious bleeding at the time of examination.  He also notes a history of ulcerative colitis, although, I do not this in his medical record.   Upon arrival to the emergency department he had 2 near syncopal episodes in the waiting room and was brought emergently back to a room.  He reports no alcohol use but does take approximately 500 mg of aspirin daily for arthritic hip pain.  He would have ongoing episodes of abdominal cramping followed by bright red blood per rectum.  He would occasionally have subsequent drops in blood pressure and heart rate to the 62B systolic and 76E respectively.  He was given 4 L of crystalloids and transfused 2 units of PRBC in the emergency department prompting PCCM consultation.  Pertinent  Medical History   has a past medical history of AAA (abdominal aortic aneurysm) ([place]), Anemia, Arthritis, Asymptomatic bilateral carotid artery stenosis (08/2015), Blood dyscrasia, Bruising, Cataracts, bilateral, Chronic ITP (idiopathic thrombocytopenia) ([place]) (03/31/2018), Coronary artery disease, Diverticulosis, Enlarged aorta ([place]), Enlarged prostate, GERD (gastroesophageal reflux disease), Glaucoma, Headache, History of colon polyps,  History of kidney stones, Hyperlipidemia, Hypertension, Joint pain, Joint swelling, Nocturia, OSA on CPAP, Sleep apnea, and Vocal cord nodule.   Significant Hospital Events: Including procedures, antibiotic start and stop dates in addition to other pertinent events   . 4/29 admit to ICU for GI bleeding . 4/29 right IJ cordis/TLC >>> . 4/29 colonoscopy  Interim History / Subjective:    Objective   Blood pressure 90/73, pulse (!) 58, temperature 98.5 F (36.9 C), temperature source Oral, resp. rate 20, height 5\' 9"  (1.753 m), weight 97.5 kg, SpO2 100 %.        Intake/Output Summary (Last 24 hours) at 05/24/2020 2338 Last data filed at 05/24/2020 2235 Gross per 24 hour  Intake 3815 ml  Output --  Net 3815 ml   Filed Weights   05/24/20 1932  Weight: 97.5 kg    Examination: General: Acutely ill appearing elderly male HENT: Normocephalic, atraumatic, PERRL, no appreciable JVD Lungs: Clear bilateral breath sounds Cardiovascular: Regular rate and rhythm Abdomen: Soft, nontender, nondistended.  Reducible umbilical hernia. Extremities: No acute deformities or range of motion rotation.  No edema Neuro: Alert, oriented, nonfocal   Labs/imaging that I have personally reviewed  (right click and "Reselect all SmartList Selections" daily)  Creatinine 1.54 WBC 14.8 Hemoglobin 12.1 Hematocrit 37.8 COVID-negative  Resolved Hospital Problem list     Assessment & Plan:   Hemorrhagic shock: secondary to acute lower GI bleed.  - Transfuse PRBC as needed for hemoglobin less than 8 in the setting of active bleed or at any sign of hemodynamic instability.  - Dr. Michail Sermon to see at bedside, planning for endoscopy - Protonix infusion - May need CTA and IR evaluation  - CBC every 6 hours - Levophed  PRN to keep MAP > 34mmHg - holding home aspirin  AKI - provide hemodynamic support and follow BMP  OSA on CPAP - QHS CPAP once clinically stable  CAD HTN Thoracic and AAA - Holding  outpatient antihypertensives while hypotensive - holding home rosuvastatin while NPO  ITP: platelets 97K on admission Leukocytosis: likely reactive - follow CBC - No need to consider platelet transfusion at this time   Best practice (right click and "Reselect all SmartList Selections" daily)  Diet:  NPO Pain/Anxiety/Delirium protocol (if indicated): No VAP protocol (if indicated): Not indicated DVT prophylaxis: SCD and Contraindicated GI prophylaxis: PPI  Glucose control:  SSI No Central venous access:  Yes, and it is still needed Arterial line:  N/A Foley:  N/A Mobility:  bed rest  PT consulted: N/A Last date of multidisciplinary goals of care discussion [ ]  Code Status:  full code Disposition: ICU  Labs   CBC: Recent Labs  Lab 05/24/20 2007  WBC 14.8*  NEUTROABS 11.9*  HGB 12.1*  HCT 37.8*  MCV 87.5  PLT 97*    Basic Metabolic Panel: Recent Labs  Lab 05/24/20 2007  NA 137  K 3.6  CL 105  CO2 22  GLUCOSE 149*  BUN 18  CREATININE 1.54*  CALCIUM 9.1   GFR: Estimated Creatinine Clearance: 51.4 mL/min (A) (by C-G formula based on SCr of 1.54 mg/dL (H)). Recent Labs  Lab 05/24/20 2007  WBC 14.8*    Liver Function Tests: Recent Labs  Lab 05/24/20 2007  AST 22  ALT 13  ALKPHOS 39  BILITOT 0.6  PROT 6.8  ALBUMIN 4.0   No results for input(s): LIPASE, AMYLASE in the last 168 hours. No results for input(s): AMMONIA in the last 168 hours.  ABG    Component Value Date/Time   PHART 7.483 (H) 09/17/2016 1452   PCO2ART 35.6 09/17/2016 1452   PO2ART 63.0 (L) 09/17/2016 1452   HCO3 26.6 09/17/2016 1452   TCO2 28 09/17/2016 1452   ACIDBASEDEF 1.0 09/16/2016 1048   O2SAT 59.9 09/20/2016 0355     Coagulation Profile: Recent Labs  Lab 05/24/20 2007  INR 1.1    Cardiac Enzymes: No results for input(s): CKTOTAL, CKMB, CKMBINDEX, TROPONINI in the last 168 hours.  HbA1C: Hgb A1c MFr Bld  Date/Time Value Ref Range Status  09/11/2016 10:48 AM  5.3 4.8 - 5.6 % Final    Comment:    (NOTE) Pre diabetes:          5.7%-6.4% Diabetes:              >6.4% Glycemic control for   <7.0% adults with diabetes   04/28/2010 07:03 AM  <5.7 % Final   5.6 (NOTE)                                                                       According to the ADA Clinical Practice Recommendations for 2011, when HbA1c is used as a screening test:   >=6.5%   Diagnostic of Diabetes Mellitus           (if abnormal result  is confirmed)  5.7-6.4%   Increased risk of developing Diabetes Mellitus  References:Diagnosis and Classification of Diabetes Mellitus,Diabetes PXTG,6269,48(NIOEV 1):S62-S69 and Standards of Medical Care  in         Diabetes - 2011,Diabetes Care,2011,34  (Suppl 1):S11-S61.    CBG: No results for input(s): GLUCAP in the last 168 hours.  Review of Systems:   Bolds are positive  Constitutional: weight loss, gain, night sweats, Fevers, chills, fatigue .  HEENT: headaches, Sore throat, sneezing, nasal congestion, post nasal drip, Difficulty swallowing, Tooth/dental problems, visual complaints visual changes, ear ache CV:  chest pain, radiates:,Orthopnea, PND, swelling in lower extremities, dizziness, palpitations, syncope.  GI  heartburn, indigestion, abdominal pain, nausea, vomiting, diarrhea, change in bowel habits, loss of appetite, bloody stools.  Resp: cough, productive: , hemoptysis, dyspnea, chest pain, pleuritic.  Skin: rash or itching or icterus GU: dysuria, change in color of urine, urgency or frequency. flank pain, hematuria  MS: joint pain or swelling. decreased range of motion  Psych: change in mood or affect. depression or anxiety.  Neuro: difficulty with speech, weakness, numbness, ataxia    Past Medical History:  He,  has a past medical history of AAA (abdominal aortic aneurysm) ([place]), Anemia, Arthritis, Asymptomatic bilateral carotid artery stenosis (08/2015), Blood dyscrasia, Bruising, Cataracts, bilateral, Chronic ITP  (idiopathic thrombocytopenia) ([place]) (03/31/2018), Coronary artery disease, Diverticulosis, Enlarged aorta ([place]), Enlarged prostate, GERD (gastroesophageal reflux disease), Glaucoma, Headache, History of colon polyps, History of kidney stones, Hyperlipidemia, Hypertension, Joint pain, Joint swelling, Nocturia, OSA on CPAP, Sleep apnea, and Vocal cord nodule.   Surgical History:   Past Surgical History:  Procedure Laterality Date  . AORTIC ARCH ANGIOGRAPHY N/A 04/16/2016   Procedure: Aortic Arch Angiography;  Surgeon: [name] Martinique, MD;  Location: [place] [facility];  Service: Cardiovascular;  Laterality: N/A;  . AORTIC VALVE REPLACEMENT N/A 09/15/2016   Procedure: AORTIC VALVE REPLACEMENT (AVR);  Surgeon: Grace Isaac, MD;  Location: [place];  Service: Open Heart Surgery;  Laterality: N/A;  Using 74mm Edwards Perimount Magna Ease Aortic Bioprosthesis Valve  . ASCENDING AORTIC ROOT REPLACEMENT N/A 09/15/2016   Procedure: ASCENDING AORTIC ROOT REPLACEMENT;  Surgeon: Grace Isaac, MD;  Location: [place];  Service: Open Heart Surgery;  Laterality: N/A;  Using 33mm Gelweave Valsalva Graft  . COLONOSCOPY    . COLONOSCOPY Left 04/16/2019   Procedure: COLONOSCOPY;  Surgeon: Arta Silence, MD;  Location: [facility] END[facility]COPY;  Service: Endoscopy;  Laterality: Left;  . COLONOSCOPY WITH ESOPHAGOGASTRODUODENOSCOPY (EGD)    . ESOPHAGOGASTRODUODENOSCOPY (EGD) WITH PROPOFOL Left 04/16/2019   Procedure: ESOPHAGOGASTRODUODENOSCOPY (EGD) WITH PROPOFOL;  Surgeon: Arta Silence, MD;  Location: [place];  Service: Endoscopy;  Laterality: Left;  . IR THORACENTESIS ASP PLEURAL SPACE W/IMG GUIDE  10/23/2016  . MICROLARYNGOSCOPY Right 09/20/2017   Procedure: MICROLARYNGOSCOPY WITH  EXCISION OF VOCAL CORD LESION;  Surgeon: Leta Baptist, MD;  Location: [place];  Service: ENT;  Laterality: Right;  . MULTIPLE EXTRACTIONS WITH ALVEOLOPLASTY N/A 06/10/2016   Procedure: Extraction of tooth #'s 2,8,13,15, and 29   with alveoloplasty, maxillary right and left buccal exostoses reductions, and gross debridement of remaining teeth.;  Surgeon: Lenn Cal, DDS;  Location: [place];  Service: Oral Surgery;  Laterality: N/A;  . RIGHT/LEFT HEART CATH AND CORONARY ANGIOGRAPHY N/A 04/16/2016   Procedure: Right/Left Heart Cath and Coronary Angiography;  Surgeon: [name] Martinique, MD;  Location: [place] [facility];  Service: Cardiovascular;  Laterality: N/A;  . TEE WITHOUT CARDIOVERSION N/A 09/15/2016   Procedure: TRANSESOPHAGEAL ECHOCARDIOGRAM (TEE);  Surgeon: Grace Isaac, MD;  Location: [place];  Service: Open Heart Surgery;  Laterality: N/A;  Social History:   reports that he has quit smoking. His smoking use included cigarettes. He has a 25.00 pack-year smoking history. He has never used smokeless tobacco. He reports that he does not drink alcohol and does not use drugs.   Family History:  His family history includes Heart attack in his father; Heart disease in his father; Thyroid disease in his sister.   Allergies No Known Allergies   Home Medications  Prior to Admission medications   Medication Sig Start Date End Date Taking? Authorizing Provider  amLODipine (NORVASC) 10 MG tablet Take 10 mg by mouth daily.    [provider]  aspirin EC 81 MG EC tablet Take 1 tablet (81 mg total) by mouth daily. 09/24/16   Gold, Wayne E, PA-C  atenolol (TENORMIN) 25 MG tablet Take 0.5 tablets (12.5 mg total) by mouth 2 (two) times daily. 09/23/16   Gold, Patrick Jupiter E, PA-C  Ensure Plus (ENSURE PLUS) LIQD Take 237 mLs by mouth.    [provider]  fluticasone (FLONASE) 50 MCG/ACT nasal spray 1 spray in each nostril 04/16/20   [provider]  latanoprost (XALATAN) 0.005 % ophthalmic solution Place 1 drop into both eyes at bedtime.    [provider]  Multiple Vitamin (MULTIVITAMIN WITH MINERALS) TABS tablet Take 1 tablet by mouth daily.    [provider]  omega-3 acid ethyl  esters (LOVAZA) 1 g capsule Take 2 capsules (2 g total) by mouth 2 (two) times daily. 05/12/19   Deberah Pelton, NP  pantoprazole (PROTONIX) 40 MG tablet Take 40 mg by mouth daily.  03/11/19   [provider]  rosuvastatin (CRESTOR) 10 MG tablet Take 1 tablet by mouth once daily 05/17/20   Martinique, [name], MD  tamsulosin [facility]) 0.4 MG CAPS capsule Take 1 capsule by mouth once daily (to improve urine flow) 12/11/13   [provider]     Critical care time: 65 minutes     Georgann Housekeeper, AGACNP-BC [place] for personal pager PCCM on call pager [phone] until 7pm. Please call Elink 7p-7a. [phone]  05/24/2020 11:58 PM

## 2020-05-24 NOTE — ED Notes (Addendum)
Pt reports feeling like he is going to be sick and that he didn't feel good. Noted that pt started to become bradycardic. Called for help. MD and CN and tech and another RN at bedside

## 2020-05-24 NOTE — ED Triage Notes (Signed)
Pt arrived to ED via POV w/ c/o rectal bleeding onset 1600, not on blood thinners. Pt reports intially stool was black and tarry which later progressed to red blood coming from rectum. Pt reports  2 near syncopal episodes today, once was while pt was briefly in the lobby.

## 2020-05-24 NOTE — ED Notes (Addendum)
Pt had 3 large BMs. Pt SBP 50's. Notified [place] MD. Give 1L bolus NS per Gilford Raid MD

## 2020-05-24 NOTE — ED Notes (Signed)
Consent obtained for central line insertion

## 2020-05-24 NOTE — ED Provider Notes (Signed)
[facility] EMERGENCY DEPARTMENT Provider Note   CSN: [id number] Arrival date & time: 05/24/20  1859     History No chief complaint on file.   Lonnie Hansen is a 70 y.o. male.  HPI Patient is a 70 year old male with an extensive medical history of AAA, frequent bruising, ITP, GERD, ulcerative colitis follows with [facility] gastroenterology presenting with a chief complaint of rectal bleeding.  Patient has had 4 large episodes of bright red blood per rectum today and and syncopized in the waiting room.  Patient denies fevers or chills, nausea vomiting abdominal pain at this time. Patient Dors similar episodes in the past that required transfusion.  Past Medical History:  Diagnosis Date  . AAA (abdominal aortic aneurysm) ([place])   . Anemia   . Arthritis   . Asymptomatic bilateral carotid artery stenosis 08/2015   1-39%   . Blood dyscrasia    "trouble with my bloods clotting"  . Bruising    on the skin states due to platelets or sometimes low or high  . Cataracts, bilateral   . 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 Coronary artery disease   . Diverticulosis   . Enlarged aorta ([place])   . Enlarged prostate    slightly  . GERD (gastroesophageal reflux disease)    takes Pantoprazole daily as needed  . Glaucoma    uses eye drops daily  . Headache   . History of colon polyps    benign  . History of kidney stones   . Hyperlipidemia    no on any meds  . Hypertension    takes Amlodipine and Atenolol daily  . Joint pain   . Joint swelling   . Nocturia   . OSA on CPAP   . Sleep apnea   . Vocal cord nodule    pt. states  it's a" growth on vocal cord"    Patient Active Problem List   Diagnosis Date Noted  . GI bleed 04/15/2019  . Rectal bleeding 04/14/2019  . Acute blood loss anemia 04/14/2019  . Acute GI bleeding 04/14/2019  . Unilateral primary osteoarthritis, 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  . Chronic ITP (idiopathic thrombocytopenia) (HCC)  03/31/2018  . Obstructive sleep apnea treated with continuous positive airway pressure (CPAP) 03/04/2017  . Vocal cord nodule   . Sleep apnea   . Nocturia   . Joint swelling   . Joint pain   . Hypertension   . History of kidney stones   . History of colon polyps   . Headache   . Glaucoma   . GERD (gastroesophageal reflux disease)   . Enlarged prostate   . Enlarged aorta ([place])   . Diverticulosis   . Coronary artery disease   . Cataracts, bilateral   . Bruising   . Blood dyscrasia   . Arthritis   . Anemia   . Hydropneumothorax 10/29/2016  . S/P AVR 09/15/2016  . Coronary artery disease involving native coronary artery of native heart without angina pectoris 04/29/2016  . Aortic insufficiency 04/01/2016  . Thoracic aortic aneurysm ([place]) 09/18/2015  . HTN (hypertension) 09/18/2015  . Hyperlipidemia 09/18/2015  . Asymptomatic bilateral carotid artery stenosis 08/27/2015    Past Surgical History:  Procedure Laterality Date  . AORTIC ARCH ANGIOGRAPHY N/A 04/16/2016   Procedure: Aortic Arch Angiography;  Surgeon: [name] Martinique, MD;  Location: [facility];  Service: Cardiovascular;  Laterality: N/A;  . AORTIC VALVE REPLACEMENT N/A 09/15/2016   Procedure: AORTIC VALVE REPLACEMENT (AVR);  Surgeon: Grace Isaac, MD;  Location: [place] [place];  Service: Open Heart Surgery;  Laterality: N/A;  Using 86mm Edwards Perimount Magna Ease Aortic Bioprosthesis Valve  . ASCENDING AORTIC ROOT REPLACEMENT N/A 09/15/2016   Procedure: ASCENDING AORTIC ROOT REPLACEMENT;  Surgeon: Grace Isaac, MD;  Location: [place];  Service: Open Heart Surgery;  Laterality: N/A;  Using 32mm Gelweave Valsalva Graft  . COLONOSCOPY    . COLONOSCOPY Left 04/16/2019   Procedure: COLONOSCOPY;  Surgeon: Arta Silence, MD;  Location: [facility] ENDOSCOPY;  Service: Endoscopy;  Laterality: Left;  . COLONOSCOPY WITH ESOPHAGOGASTRODUODENOSCOPY (EGD)    . ESOPHAGOGASTRODUODENOSCOPY (EGD) WITH PROPOFOL Left 04/16/2019    Procedure: ESOPHAGOGASTRODUODENOSCOPY (EGD) WITH PROPOFOL;  Surgeon: Arta Silence, MD;  Location: [facility];  Service: Endoscopy;  Laterality: Left;  . IR THORACENTESIS ASP PLEURAL SPACE W/IMG GUIDE  10/23/2016  . MICROLARYNGOSCOPY Right 09/20/2017   Procedure: MICROLARYNGOSCOPY WITH  EXCISION OF VOCAL CORD LESION;  Surgeon: Leta Baptist, MD;  Location: [facility];  Service: ENT;  Laterality: Right;  . MULTIPLE EXTRACTIONS WITH ALVEOLOPLASTY N/A 06/10/2016   Procedure: Extraction of tooth #'s 2,8,13,15, and 29  with alveoloplasty, maxillary right and left buccal exostoses reductions, and gross debridement of remaining teeth.;  Surgeon: Lenn Cal, DDS;  Location: [facility];  Service: Oral Surgery;  Laterality: N/A;  . RIGHT/LEFT HEART CATH AND CORONARY ANGIOGRAPHY N/A 04/16/2016   Procedure: Right/Left Heart Cath and Coronary Angiography;  Surgeon: [name] Martinique, MD;  Location: [place] [facility];  Service: Cardiovascular;  Laterality: N/A;  . TEE WITHOUT CARDIOVERSION N/A 09/15/2016   Procedure: TRANSESOPHAGEAL ECHOCARDIOGRAM (TEE);  Surgeon: Grace Isaac, MD;  Location: [facility];  Service: Open Heart Surgery;  Laterality: N/A;       Family History  Problem Relation Age of Onset  . Heart disease Father   . Heart attack Father   . Thyroid disease Sister     Social History   Tobacco Use  . Smoking status: Former Smoker    Packs/day: 1.00    Years: 25.00    Pack years: 25.00    Types: Cigarettes  . Smokeless tobacco: Never Used  Vaping Use  . Vaping Use: Never used  Substance Use Topics  . Alcohol use: No    Alcohol/week: 0.0 standard drinks  . Drug use: No    Home Medications Prior to Admission medications   Medication Sig Start Date End Date Taking? Authorizing Provider  amLODipine (NORVASC) 10 MG tablet Take 10 mg by mouth daily.    [provider]  aspirin EC 81 MG EC tablet Take 1 tablet (81 mg total) by mouth daily. 09/24/16   Gold, Wayne E,  PA-C  atenolol (TENORMIN) 25 MG tablet Take 0.5 tablets (12.5 mg total) by mouth 2 (two) times daily. 09/23/16   Gold, Patrick Jupiter E, PA-C  Ensure Plus (ENSURE PLUS) LIQD Take 237 mLs by mouth.    [provider]  fluticasone (FLONASE) 50 MCG/ACT nasal spray 1 spray in each nostril 04/16/20   [provider]  latanoprost (XALATAN) 0.005 % ophthalmic solution Place 1 drop into both eyes at bedtime.    [provider]  Multiple Vitamin (MULTIVITAMIN WITH MINERALS) TABS tablet Take 1 tablet by mouth daily.    [provider]  omega-3 acid ethyl esters (LOVAZA) 1 g capsule Take 2 capsules (2 g total) by mouth 2 (two) times daily. 05/12/19   Deberah Pelton, NP  pantoprazole (PROTONIX) 40 MG tablet Take 40  mg by mouth daily.  03/11/19   [provider]  rosuvastatin (CRESTOR) 10 MG tablet Take 1 tablet by mouth once daily 05/17/20   Martinique, [name], MD  tamsulosin [facility]) 0.4 MG CAPS capsule Take 1 capsule by mouth once daily (to improve urine flow) 12/11/13   [provider]    Allergies    Patient has no known allergies.  Review of Systems   Review of Systems  Constitutional: Negative for chills and fever.  HENT: Negative for ear pain and sore throat.   Eyes: Negative for pain and visual disturbance.  Respiratory: Negative for cough and shortness of breath.   Cardiovascular: Negative for chest pain and palpitations.  Gastrointestinal: Positive for blood in stool. Negative for abdominal pain, anal bleeding and vomiting.  Genitourinary: Negative for dysuria and hematuria.  Musculoskeletal: Negative for arthralgias and back pain.  Skin: Negative for color change and rash.  Neurological: Negative for seizures and syncope.  All other systems reviewed and are negative.   Physical Exam Updated Vital Signs BP 107/76 (BP Location: Left Arm)   Pulse 66   Resp 18   SpO2 98%   Physical Exam Vitals and nursing note reviewed.  Constitutional:       Appearance: He is well-developed.  HENT:     Head: Normocephalic and atraumatic.     Nose: No congestion or rhinorrhea.     Mouth/Throat:     Mouth: Mucous membranes are moist.     Pharynx: Oropharynx is clear. No oropharyngeal exudate.  Eyes:     Conjunctiva/sclera: Conjunctivae normal.     Pupils: Pupils are equal, round, and reactive to light.  Cardiovascular:     Rate and Rhythm: Normal rate and regular rhythm.     Heart sounds: No murmur heard.   Pulmonary:     Effort: Pulmonary effort is normal. No respiratory distress.     Breath sounds: Normal breath sounds.  Abdominal:     Palpations: Abdomen is soft.     Tenderness: There is no abdominal tenderness.  Musculoskeletal:        General: No swelling, tenderness, deformity or signs of injury. Normal range of motion.     Cervical back: Neck supple. No rigidity or tenderness.  Skin:    General: Skin is warm and dry.  Neurological:     General: No focal deficit present.     Mental Status: He is alert and oriented to person, place, and time. Mental status is at baseline.     Cranial Nerves: No cranial nerve deficit.     Motor: No weakness.     ED Results / Procedures / Treatments   Labs (all labs ordered are listed, but only abnormal results are displayed) Labs Reviewed  RESP PANEL BY RT-PCR (FLU A&B, COVID) [id number]  COMPREHENSIVE METABOLIC PANEL  CBC WITH DIFFERENTIAL/PLATELET  PROTIME-INR  POC OCCULT BLOOD, ED  TYPE AND SCREEN    EKG None  Radiology No results found.  Procedures Procedures   Medications Ordered in ED Medications  sodium chloride 0.9 % bolus 1,000 mL (has no administration in time range)    And  0.9 %  sodium chloride infusion (has no administration in time range)    ED Course  I have reviewed the triage vital signs and the nursing notes.  Pertinent labs & imaging results that were available during my care of the patient were reviewed by me and considered in my medical decision making  (see chart for details).  MDM Rules/Calculators/A&P                         Patient is a 69-year-old male with extensive medical history present with chief complaint of black-red blood per rectum.  Patient endorsing multiple episodes of hematochezia today.  Endorses similar episode in the past related to a ulcerative colitis flare.  Patient brought back emergently from the waiting room bathroom due to syncope.  Found to be hypotensive and tachycardic.  Access was established and 2 L of fluid and 1 unit of packed red blood cells emergently transfused.  Patient stabilized enough to allow for further work-up.  However approximately 2 hours into his evaluation he again became hypotensive with blood pressures in the 50s.  Critical care was consulted and a second unit of packed red blood cells was initiated. Patient's history of present illness episodes and findings most consistent with acute lower GI bleeding of uncertain etiology.  Could be diverticular hemorrhage given his multiple episodes.  We will plan to admit to critical care for further evaluation management.  GI consulted Programme researcher, broadcasting/film/video) and without plan for acute intervention at this time.   Disposition: Based on the above findings, I believe patient is stable for admission.   Patient/family educated about specific findings on our evaluation and explained exact reasons for admission.  Patient/family educated about clinical situation and time was allowed to answer questions.   Admission team communicated with and agreed with need for admission. Patient admitted. Patient ready to move at this time.    Emergency Department Medication Summary: Medications  sodium chloride 0.9 % bolus 1,000 mL (0 mLs Intravenous Stopped 05/24/20 2038)    And  0.9 %  sodium chloride infusion (has no administration in time range)  pantoprazole (PROTONIX) 80 mg in sodium chloride 0.9 % 100 mL IVPB (has no administration in time range)  pantoprazole (PROTONIX) 80 mg in  sodium chloride 0.9 % 100 mL (0.8 mg/mL) infusion (has no administration in time range)  pantoprazole (PROTONIX) injection 40 mg (has no administration in time range)  pantoprazole (PROTONIX) injection 40 mg (has no administration in time range)  0.9 %  sodium chloride infusion (has no administration in time range)  norepinephrine (LEVOPHED) 4mg  in 248mL premix infusion (2 mcg/min Intravenous New Bag/Given 05/24/20 2308)  sodium chloride flush (NS) 0.9 % injection 10-40 mL (has no administration in time range)  sodium chloride flush (NS) 0.9 % injection 10-40 mL (has no administration in time range)  Chlorhexidine Gluconate Cloth 2 % PADS 6 each (has no administration in time range)  sodium chloride 0.9 % bolus 1,000 mL (0 mLs Intravenous Stopped 05/24/20 2138)  0.9 %  sodium chloride infusion (Manually program via Guardrails IV Fluids) ( Intravenous Stopped 05/24/20 2251)  sodium chloride 0.9 % bolus 1,000 mL (0 mLs Intravenous Stopped 05/24/20 2202)  sodium chloride 0.9 % bolus 1,000 mL (0 mLs Intravenous Stopped 05/24/20 2202)  sodium chloride 0.9 % bolus 1,000 mL (1,000 mLs Intravenous New Bag/Given 05/24/20 2305)    Final Clinical Impression(s) / ED Diagnoses Final diagnoses:  GI bleed    Rx / DC Orders ED Discharge Orders    None       Tretha Sciara, MD 05/24/20 2351    Isla Pence, MD 05/27/20 1536

## 2020-05-24 NOTE — ED Notes (Signed)
IV team at bedside 

## 2020-05-25 ENCOUNTER — Inpatient Hospital Stay (HOSPITAL_COMMUNITY): Payer: Medicare Other

## 2020-05-25 ENCOUNTER — Encounter (HOSPITAL_COMMUNITY)
Admission: EM | Disposition: A | Discharge status: Home / Self Care | Payer: Self-pay | Source: Home / Self Care | Attending: Pulmonary Disease

## 2020-05-25 ENCOUNTER — Inpatient Hospital Stay (HOSPITAL_COMMUNITY): Payer: Medicare Other | Admitting: Anesthesiology

## 2020-05-25 ENCOUNTER — Encounter (HOSPITAL_COMMUNITY): Payer: Self-pay | Admitting: Pulmonary Disease

## 2020-05-25 DIAGNOSIS — N179 Acute kidney failure, unspecified: Secondary | ICD-10-CM | POA: Diagnosis not present

## 2020-05-25 DIAGNOSIS — R578 Other shock: Secondary | ICD-10-CM

## 2020-05-25 DIAGNOSIS — I469 Cardiac arrest, cause unspecified: Secondary | ICD-10-CM | POA: Diagnosis not present

## 2020-05-25 DIAGNOSIS — K922 Gastrointestinal hemorrhage, unspecified: Secondary | ICD-10-CM | POA: Diagnosis not present

## 2020-05-25 HISTORY — PX: ESOPHAGOGASTRODUODENOSCOPY: SHX5428

## 2020-05-25 HISTORY — PX: FLEXIBLE SIGMOIDOSCOPY: SHX5431

## 2020-05-25 LAB — CBC
HCT: 30.7 % — ABNORMAL LOW (ref 39.0–52.0)
HCT: 29.5 % — ABNORMAL LOW (ref 39.0–52.0)
Hemoglobin: 10.6 g/dL — ABNORMAL LOW (ref 13.0–17.0)
Hemoglobin: 9.9 g/dL — ABNORMAL LOW (ref 13.0–17.0)
MCH: 30.2 pg (ref 26.0–34.0)
MCH: 29.8 pg (ref 26.0–34.0)
MCHC: 34.5 g/dL (ref 30.0–36.0)
MCHC: 33.6 g/dL (ref 30.0–36.0)
MCV: 87.5 fL (ref 80.0–100.0)
MCV: 88.9 fL (ref 80.0–100.0)
Platelets: 62 10*3/uL — ABNORMAL LOW (ref 150–400)
Platelets: 62 10*3/uL — ABNORMAL LOW (ref 150–400)
RBC: 3.51 MIL/uL — ABNORMAL LOW (ref 4.22–5.81)
RBC: 3.32 MIL/uL — ABNORMAL LOW (ref 4.22–5.81)
RDW: 16.9 % — ABNORMAL HIGH (ref 11.5–15.5)
RDW: 17.2 % — ABNORMAL HIGH (ref 11.5–15.5)
WBC: 13 10*3/uL — ABNORMAL HIGH (ref 4.0–10.5)
WBC: 11.6 10*3/uL — ABNORMAL HIGH (ref 4.0–10.5)
nRBC: 0 % (ref 0.0–0.2)
nRBC: 0 % (ref 0.0–0.2)

## 2020-05-25 LAB — POCT I-STAT 7, (LYTES, BLD GAS, ICA,H+H)
Acid-base deficit: 7 mmol/L — ABNORMAL HIGH (ref 0.0–2.0)
Bicarbonate: 19.9 mmol/L — ABNORMAL LOW (ref 20.0–28.0)
Calcium, Ion: 1.11 mmol/L — ABNORMAL LOW (ref 1.15–1.40)
HCT: 33 % — ABNORMAL LOW (ref 39.0–52.0)
Hemoglobin: 11.2 g/dL — ABNORMAL LOW (ref 13.0–17.0)
O2 Saturation: 100 %
Patient temperature: 97.9
Potassium: 3.5 mmol/L (ref 3.5–5.1)
Sodium: 143 mmol/L (ref 135–145)
TCO2: 21 mmol/L — ABNORMAL LOW (ref 22–32)
pCO2 arterial: 41.8 mmHg (ref 32.0–48.0)
pH, Arterial: 7.283 — ABNORMAL LOW (ref 7.350–7.450)
pO2, Arterial: 399 mmHg — ABNORMAL HIGH (ref 83.0–108.0)

## 2020-05-25 LAB — BASIC METABOLIC PANEL
Anion gap: 4 — ABNORMAL LOW (ref 5–15)
BUN: 18 mg/dL (ref 8–23)
CO2: 22 mmol/L (ref 22–32)
Calcium: 7.4 mg/dL — ABNORMAL LOW (ref 8.9–10.3)
Chloride: 114 mmol/L — ABNORMAL HIGH (ref 98–111)
Creatinine, Ser: 1.29 mg/dL — ABNORMAL HIGH (ref 0.61–1.24)
GFR, Estimated: 60 mL/min — ABNORMAL LOW (ref >60)
Glucose, Bld: 131 mg/dL — ABNORMAL HIGH (ref 70–99)
Potassium: 3.6 mmol/L (ref 3.5–5.1)
Sodium: 140 mmol/L (ref 135–145)

## 2020-05-25 LAB — DIC (DISSEMINATED INTRAVASCULAR COAGULATION)PANEL
D-Dimer, Quant: 19.54 ug/mL-FEU — ABNORMAL HIGH (ref 0.00–0.50)
Fibrinogen: 189 mg/dL — ABNORMAL LOW (ref 210–475)
INR: 1.2 (ref 0.8–1.2)
Platelets: 71 10*3/uL — ABNORMAL LOW (ref 150–400)
Prothrombin Time: 15.5 seconds — ABNORMAL HIGH (ref 11.4–15.2)
Smear Review: NONE SEEN
aPTT: 29 seconds (ref 24–36)

## 2020-05-25 LAB — COMPREHENSIVE METABOLIC PANEL
ALT: 18 U/L (ref 0–44)
AST: 26 U/L (ref 15–41)
Albumin: 3 g/dL — ABNORMAL LOW (ref 3.5–5.0)
Alkaline Phosphatase: 32 U/L — ABNORMAL LOW (ref 38–126)
Anion gap: 11 (ref 5–15)
BUN: 16 mg/dL (ref 8–23)
CO2: 21 mmol/L — ABNORMAL LOW (ref 22–32)
Calcium: 7.9 mg/dL — ABNORMAL LOW (ref 8.9–10.3)
Chloride: 108 mmol/L (ref 98–111)
Creatinine, Ser: 1.28 mg/dL — ABNORMAL HIGH (ref 0.61–1.24)
GFR, Estimated: >60 mL/min (ref >60)
Glucose, Bld: 208 mg/dL — ABNORMAL HIGH (ref 70–99)
Potassium: 3.5 mmol/L (ref 3.5–5.1)
Sodium: 140 mmol/L (ref 135–145)
Total Bilirubin: 1.2 mg/dL (ref 0.3–1.2)
Total Protein: 5.2 g/dL — ABNORMAL LOW (ref 6.5–8.1)

## 2020-05-25 LAB — PROCALCITONIN: Procalcitonin: <0.1 ng/mL

## 2020-05-25 LAB — POCT I-STAT, CHEM 8
BUN: 19 mg/dL (ref 8–23)
Calcium, Ion: 1.17 mmol/L (ref 1.15–1.40)
Chloride: 109 mmol/L (ref 98–111)
Creatinine, Ser: 1.2 mg/dL (ref 0.61–1.24)
Glucose, Bld: 133 mg/dL — ABNORMAL HIGH (ref 70–99)
HCT: 30 % — ABNORMAL LOW (ref 39.0–52.0)
Hemoglobin: 10.2 g/dL — ABNORMAL LOW (ref 13.0–17.0)
Potassium: 4.1 mmol/L (ref 3.5–5.1)
Sodium: 143 mmol/L (ref 135–145)
TCO2: 21 mmol/L — ABNORMAL LOW (ref 22–32)

## 2020-05-25 LAB — MAGNESIUM
Magnesium: 1.5 mg/dL — ABNORMAL LOW (ref 1.7–2.4)
Magnesium: 1.8 mg/dL (ref 1.7–2.4)

## 2020-05-25 LAB — PHOSPHORUS: Phosphorus: 3.1 mg/dL (ref 2.5–4.6)

## 2020-05-25 LAB — HIV ANTIBODY (ROUTINE TESTING W REFLEX): HIV Screen 4th Generation wRfx: NONREACTIVE

## 2020-05-25 LAB — MRSA PCR SCREENING: MRSA by PCR: NEGATIVE

## 2020-05-25 LAB — HEMOGLOBIN AND HEMATOCRIT, BLOOD
HCT: 34.2 % — ABNORMAL LOW (ref 39.0–52.0)
Hemoglobin: 12 g/dL — ABNORMAL LOW (ref 13.0–17.0)

## 2020-05-25 LAB — LACTIC ACID, PLASMA: Lactic Acid, Venous: 2.3 mmol/L (ref 0.5–1.9)

## 2020-05-25 LAB — MASSIVE TRANSFUSION PROTOCOL ORDER (BLOOD BANK NOTIFICATION)

## 2020-05-25 SURGERY — EGD (ESOPHAGOGASTRODUODENOSCOPY)
Anesthesia: Moderate Sedation

## 2020-05-25 MED ORDER — POLYETHYLENE GLYCOL 3350 17 G PO PACK: 17.0000 g | PACK | Freq: Every day | ORAL | Status: DC | PRN

## 2020-05-25 MED ORDER — TECHNETIUM TC 99M-LABELED RED BLOOD CELLS IV KIT
24.0000 | PACK | Freq: Once | INTRAVENOUS | Status: AC | PRN
  Administered 2020-05-25: 24 via INTRAVENOUS

## 2020-05-25 MED ORDER — PROPOFOL 1000 MG/100ML IV EMUL
INTRAVENOUS | Status: AC
  Filled 2020-05-25: qty 100

## 2020-05-25 MED ORDER — FENTANYL BOLUS VIA INFUSION
25.0000 ug | INTRAVENOUS | Status: DC | PRN
Start: 2020-05-25 — End: 2020-05-26
  Administered 2020-05-25: 100 ug via INTRAVENOUS
  Filled 2020-05-25: qty 100

## 2020-05-25 MED ORDER — MAGNESIUM SULFATE 2 GM/50ML IV SOLN
2.0000 g | Freq: Once | INTRAVENOUS | Status: AC
  Administered 2020-05-25: 2 g via INTRAVENOUS
  Filled 2020-05-25: qty 50

## 2020-05-25 MED ORDER — ROCURONIUM BROMIDE 10 MG/ML (PF) SYRINGE
PREFILLED_SYRINGE | INTRAVENOUS | Status: AC
  Filled 2020-05-25: qty 10

## 2020-05-25 MED ORDER — FENTANYL CITRATE (PF) 100 MCG/2ML IJ SOLN
INTRAMUSCULAR | Status: AC
  Filled 2020-05-25: qty 2

## 2020-05-25 MED ORDER — CHLORHEXIDINE GLUCONATE 0.12% ORAL RINSE (MEDLINE KIT)
15.0000 mL | Freq: Two times a day (BID) | OROMUCOSAL | Status: DC
  Administered 2020-05-25 – 2020-05-27 (×4): 15 mL via OROMUCOSAL

## 2020-05-25 MED ORDER — SODIUM CHLORIDE 0.9 % IV SOLN: INTRAVENOUS | Status: DC

## 2020-05-25 MED ORDER — FENTANYL CITRATE (PF) 100 MCG/2ML IJ SOLN: 25.0000 ug | Freq: Once | INTRAMUSCULAR | Status: DC

## 2020-05-25 MED ORDER — DOCUSATE SODIUM 50 MG/5ML PO LIQD: 100.0000 mg | Freq: Two times a day (BID) | ORAL | Status: DC | PRN

## 2020-05-25 MED ORDER — TRANEXAMIC ACID-NACL 1000-0.7 MG/100ML-% IV SOLN
1000.0000 mg | INTRAVENOUS | Status: AC
  Administered 2020-05-25: 1000 mg via INTRAVENOUS
  Filled 2020-05-25: qty 100

## 2020-05-25 MED ORDER — ETOMIDATE 2 MG/ML IV SOLN
INTRAVENOUS | Status: DC | PRN
  Administered 2020-05-25: 12 mg via INTRAVENOUS

## 2020-05-25 MED ORDER — POTASSIUM CHLORIDE 10 MEQ/50ML IV SOLN
10.0000 meq | INTRAVENOUS | Status: AC
  Administered 2020-05-25 (×4): 10 meq via INTRAVENOUS
  Filled 2020-05-25 (×4): qty 50

## 2020-05-25 MED ORDER — DIPHENHYDRAMINE HCL 50 MG/ML IJ SOLN
INTRAMUSCULAR | Status: AC
  Filled 2020-05-25: qty 1

## 2020-05-25 MED ORDER — MIDAZOLAM HCL 2 MG/2ML IJ SOLN
INTRAMUSCULAR | Status: AC
  Filled 2020-05-25: qty 2

## 2020-05-25 MED ORDER — POTASSIUM CHLORIDE 10 MEQ/50ML IV SOLN
10.0000 meq | INTRAVENOUS | Status: AC
  Administered 2020-05-25 – 2020-05-26 (×3): 10 meq via INTRAVENOUS
  Filled 2020-05-25 (×3): qty 50

## 2020-05-25 MED ORDER — SUCCINYLCHOLINE CHLORIDE 20 MG/ML IJ SOLN
INTRAMUSCULAR | Status: DC | PRN
  Administered 2020-05-25: 120 mg via INTRAVENOUS

## 2020-05-25 MED ORDER — MIDAZOLAM HCL (PF) 5 MG/ML IJ SOLN
INTRAMUSCULAR | Status: AC
  Filled 2020-05-25: qty 2

## 2020-05-25 MED ORDER — ETOMIDATE 2 MG/ML IV SOLN
INTRAVENOUS | Status: AC
  Filled 2020-05-25: qty 20

## 2020-05-25 MED ORDER — FENTANYL CITRATE (PF) 100 MCG/2ML IJ SOLN
INTRAMUSCULAR | Status: AC
  Filled 2020-05-25: qty 4

## 2020-05-25 MED ORDER — CALCIUM CHLORIDE 10 % IV SOLN
1.0000 g | Freq: Once | INTRAVENOUS | Status: AC
  Administered 2020-05-25: 1 g via INTRAVENOUS

## 2020-05-25 MED ORDER — DOCUSATE SODIUM 100 MG PO CAPS: 100.0000 mg | ORAL_CAPSULE | Freq: Two times a day (BID) | ORAL | Status: DC | PRN

## 2020-05-25 MED ORDER — PROPOFOL 1000 MG/100ML IV EMUL
0.0000 ug/kg/min | INTRAVENOUS | Status: DC
  Filled 2020-05-25: qty 100

## 2020-05-25 MED ORDER — SUCCINYLCHOLINE CHLORIDE 200 MG/10ML IV SOSY
PREFILLED_SYRINGE | INTRAVENOUS | Status: AC
  Filled 2020-05-25: qty 10

## 2020-05-25 MED ORDER — FENTANYL 2500MCG IN NS 250ML (10MCG/ML) PREMIX INFUSION
0.0000 ug/h | INTRAVENOUS | Status: DC
  Administered 2020-05-25: 250 ug/h via INTRAVENOUS
  Administered 2020-05-25: 325 ug/h via INTRAVENOUS
  Administered 2020-05-26: 326 ug/h via INTRAVENOUS
  Filled 2020-05-25 (×3): qty 250

## 2020-05-25 MED ORDER — SODIUM CHLORIDE 0.9% IV SOLUTION: Freq: Once | INTRAVENOUS | Status: AC

## 2020-05-25 MED ORDER — PROPOFOL 1000 MG/100ML IV EMUL
INTRAVENOUS | Status: AC
  Administered 2020-05-25: 5 ug/kg/min via INTRAVENOUS
  Filled 2020-05-25: qty 100

## 2020-05-25 MED ORDER — IOHEXOL 350 MG/ML SOLN
100.0000 mL | Freq: Once | INTRAVENOUS | Status: AC | PRN
  Administered 2020-05-25: 100 mL via INTRAVENOUS

## 2020-05-25 MED ORDER — FENTANYL 2500MCG IN NS 250ML (10MCG/ML) PREMIX INFUSION
25.0000 ug/h | INTRAVENOUS | Status: DC
  Administered 2020-05-25: 50 ug/h via INTRAVENOUS
  Filled 2020-05-25: qty 250

## 2020-05-25 MED ORDER — SODIUM CHLORIDE 0.9 % IV SOLN
10.0000 mL/h | Freq: Once | INTRAVENOUS | Status: AC
  Administered 2020-05-25: 10 mL/h via INTRAVENOUS

## 2020-05-25 MED ORDER — SODIUM CHLORIDE 0.9 % IV SOLN
1.0000 mg/kg/h | INTRAVENOUS | Status: AC
  Administered 2020-05-25: 0.6 mg/kg/h via INTRAVENOUS
  Filled 2020-05-25 (×2): qty 5

## 2020-05-25 MED ORDER — SODIUM CHLORIDE 0.9 % IV SOLN
1.0000 mg/kg/h | INTRAVENOUS | Status: DC
  Administered 2020-05-25: 1 mg/kg/h via INTRAVENOUS
  Filled 2020-05-25: qty 12.5

## 2020-05-25 MED ORDER — ORAL CARE MOUTH RINSE
15.0000 mL | OROMUCOSAL | Status: DC
  Administered 2020-05-25 – 2020-05-26 (×11): 15 mL via OROMUCOSAL

## 2020-05-25 MED ORDER — PANTOPRAZOLE SODIUM 40 MG IV SOLR
40.0000 mg | Freq: Two times a day (BID) | INTRAVENOUS | Status: DC
  Administered 2020-05-25 – 2020-05-27 (×6): 40 mg via INTRAVENOUS
  Filled 2020-05-25 (×6): qty 40

## 2020-05-25 MED ORDER — KETAMINE HCL 50 MG/5ML IJ SOSY
PREFILLED_SYRINGE | INTRAMUSCULAR | Status: AC
  Filled 2020-05-25: qty 5

## 2020-05-25 SURGICAL SUPPLY — 15 items

## 2020-05-25 NOTE — Transfer of Care (Signed)
Immediate Anesthesia Transfer of Care Note  Patient: Lonnie Hansen  Procedure(s) Performed: AN AD HOC INTUBATION  Patient Location: ICU  Anesthesia Type:General  Level of Consciousness: Patient remains intubated per anesthesia plan  Airway & Oxygen Therapy: Patient remains intubated per anesthesia plan and Patient placed on Ventilator (see vital sign flow sheet for setting)  Post-op Assessment: Report given to RN and Post -op Vital signs reviewed and stable  Post vital signs: Reviewed and stable  Last Vitals:  Vitals Value Taken Time  BP 60/51 05/25/20 0021  Temp    Pulse 92 05/25/20 0025  Resp 20 05/25/20 0025  SpO2 100 % 05/25/20 0025  Vitals shown include unvalidated device data.  Last Pain:  Vitals:   05/24/20 2235  TempSrc: Oral  PainSc:          Complications: No complications documented.

## 2020-05-25 NOTE — Progress Notes (Addendum)
PCCM:  I was paged by radiology. Results of NM study concerning for bleeding in distal colon/rectum.   IR consulted for possible embolization evaluation.   Garner Nash, DO [facility] 05/25/2020 8:19 AM     I spoke with Dr. Dwaine Gale from IR who plans on embolizing patient.   Garner Nash, DO [facility] 05/25/2020 8:31 AM

## 2020-05-25 NOTE — Progress Notes (Addendum)
[facility] Progress Note Patient Name: Lonnie Hansen DOB: 18-Mar-1950 MRN: [id number]   Date of Service  05/25/2020  HPI/Events of Note  Request to review CXR for R IJ CVL placement. R IJ CVL tipe in distal SVC. No pneumothorax.  eICU Interventions  OK to use R IJ CVL.     Intervention Category Major Interventions: Other:  [name] 05/25/2020, 3:15 AM

## 2020-05-25 NOTE — Op Note (Signed)
[facility] Patient Name: Lonnie Hansen Procedure Date : 05/25/2020 MRN: [id number] Attending MD: [name] , MD Date of Birth: [birth year] CSN: [id number] Age: 70 Admit Type: Inpatient Procedure:                Upper GI endoscopy Indications:              Active gastrointestinal bleeding, Hematochezia Providers:                [name], MD, Clyde Lundborg, RN, Tyrone Apple, Technician Referring MD:              Medicines:                Fentanyl drip and Ketamine drip per CCM team Complications:            scope trauma Estimated Blood Loss:     Estimated blood loss: none. Procedure:                Pre-Anesthesia Assessment:                           - Prior to the procedure, a History and Physical                            was performed, and patient medications and                            allergies were reviewed. The patient's tolerance of                            previous anesthesia was also reviewed. The risks                            and benefits of the procedure and the sedation                            options and risks were discussed with the patient.                            All questions were answered, and informed consent                            was obtained. Prior Anticoagulants: The patient has                            taken no previous anticoagulant or antiplatelet                            agents. ASA Grade Assessment: IV - A patient with                            severe systemic disease that is a constant threat  to life. After reviewing the risks and benefits,                            the patient was deemed in satisfactory condition to                            undergo the procedure.                           After obtaining informed consent, the endoscope was                            passed under direct vision. Throughout the                             procedure, the patient's blood pressure, pulse, and                            oxygen saturations were monitored continuously. The                            [id number]-H190 ([id number]) Olympus gastroscope was                            introduced through the mouth, and advanced to the                            second part of duodenum. The upper GI endoscopy was                            accomplished without difficulty. The patient                            tolerated the procedure well. Scope In: Scope Out: Findings:      The examined esophagus was normal.      The Z-line was regular and was found 40 cm from the incisors.      Patchy mild inflammation characterized by congestion (edema) and       erythema was found in the gastric antrum.      A large amount of food (residue) was found in the gastric fundus and in       the gastric body.      A large non-bleeding diverticulum was found in the second portion of the       duodenum.      The exam of the duodenum was otherwise normal. Impression:               - Normal esophagus.                           - Z-line regular, 40 cm from the incisors.                           - Gastritis.                           -  A large amount of food (residue) in the stomach.                           - Non-bleeding duodenal diverticulum.                           - No specimens collected. Recommendation:           - Proceed with unprepped flexible sigmoidoscopy.                           - Observe patient's clinical course.                           - Volume resuscitate. Procedure Code(s):        --- Professional ---                           [phone], Esophagogastroduodenoscopy, flexible,                            transoral; diagnostic, including collection of                            specimen(s) by brushing or washing, when performed                            (separate procedure) Diagnosis Code(s):        --- Professional ---                            K92.1, Melena (includes Hematochezia)                           K92.2, Gastrointestinal hemorrhage, unspecified                           K29.70, Gastritis, unspecified, without bleeding                           K57.10, Diverticulosis of small intestine without                            perforation or abscess without bleeding CPT copyright 2019 [employer]. All rights reserved. The codes documented in this report are preliminary and upon coder review may  be revised to meet current compliance requirements. [name], MD 05/25/2020 1:52:11 AM This report has been signed electronically. Number of Addenda: 0

## 2020-05-25 NOTE — Progress Notes (Signed)
NAME:  Lonnie Hansen, MRN:  [id number], DOB:  1950-07-04, LOS: 1 ADMISSION DATE:  05/24/2020, CONSULTATION DATE: 4/29 REFERRING MD: Dr. Gilford Raid EDP, CHIEF COMPLAINT: Gastrointestinal hemorrhage  History of Present Illness:  70 year old male with past medical history as below, which is significant for thoracic and abdominal aortic aneurysm, obstructive sleep apnea, ITP, coronary artery disease, and hypertension.  He presented [name] emergency department in the evening hours of 4/29 with complaints of bright red blood per rectum starting earlier that day with associated abdominal cramping.  He had an admission to [facility] in March of last year with similar complaints and was found to have multiple diverticula on colonoscopy, however, no obvious bleeding at the time of examination.  He also notes a history of ulcerative colitis, although, I do not this in his medical record.   Upon arrival to the emergency department he had 2 near syncopal episodes in the waiting room and was brought emergently back to a room.  He reports no alcohol use but does take approximately 500 mg of aspirin daily for arthritic hip pain.  He would have ongoing episodes of abdominal cramping followed by bright red blood per rectum.  He would occasionally have subsequent drops in blood pressure and heart rate to the 81O systolic and 17P respectively.  He was given 4 L of crystalloids and transfused 2 units of PRBC in the emergency department prompting PCCM consultation.  Pertinent  Medical History   has a past medical history of AAA (abdominal aortic aneurysm) ([place]), Anemia, Arthritis, Asymptomatic bilateral carotid artery stenosis (08/2015), Blood dyscrasia, Bruising, Cataracts, bilateral, Chronic ITP (idiopathic thrombocytopenia) ([place]) (03/31/2018), Coronary artery disease, Diverticulosis, Enlarged aorta ([place]), Enlarged prostate, GERD (gastroesophageal reflux disease), Glaucoma, Headache, History of colon polyps,  History of kidney stones, Hyperlipidemia, Hypertension, Joint pain, Joint swelling, Nocturia, OSA on CPAP, Sleep apnea, and Vocal cord nodule.   Significant Hospital Events: Including procedures, antibiotic start and stop dates in addition to other pertinent events   . 4/29 admit to ICU for GI bleeding . 4/29 right IJ cordis/TLC >>> . 4/29 colonoscopy  Interim History / Subjective:   Remains critically ill intubated mechanical life support.  Brief arrest overnight.   Objective   Blood pressure 106/70, pulse 62, temperature 97.9 F (36.6 C), temperature source Axillary, resp. rate 18, height 5\' 9"  (1.753 m), weight 100.6 kg, SpO2 100 %.    Vent Mode: PRVC FiO2 (%):  [40 %-100 %] 40 % Set Rate:  [16 bmp] 16 bmp Vt Set:  [560 mL] 560 mL PEEP:  [5 cmH20] 5 cmH20 Plateau Pressure:  [12 cmH20-15 cmH20] 15 cmH20   Intake/Output Summary (Last 24 hours) at 05/25/2020 1025 Last data filed at 05/25/2020 8527 Gross per 24 hour  Intake 4395.05 ml  Output --  Net 4395.05 ml   Filed Weights   05/24/20 1932 05/25/20 0351  Weight: 97.5 kg 100.6 kg    Examination: General: Elderly male, intubated on mechanical life support does open eyes to voice HENT: NCAT, tracking, endotracheal tube in place Lungs: Clear to auscultation bilaterally Cardiovascular: Regular rhythm, S1-S2 Abdomen: Soft, nontender nondistended Extremities: Moves all 4 extremities Neuro: Alert to voice, does open eyes, will follow commands   Labs/imaging that I have personally reviewed  (right click and "Reselect all SmartList Selections" daily)  Creatinine 1.54 WBC 14.8 Hemoglobin 12.1 Hematocrit 37.8 COVID-negative  Resolved Hospital Problem list     Assessment & Plan:   Hemorrhagic shock: secondary to acute lower GI bleed.  -  Conservative transfusion threshold for hemoglobin less than 8 or any sign of hemodynamic instability - Nuclear med tagged bleeding scan completed with possible lower GI bleeding. -CTA  completed with no evidence of active extravasation. IR has been consulted to follow along.  No plans for intervention at this time. Remains on low-dose norepinephrine. Continue to follow hemoglobin and transfuse I have spoke with IR who will continue to follow the patient  PEA cardiac arrest 1 round of epinephrine, likely vagal event following significant bloody bowel movement. Plan: Supportive care  AKI -Continue to support blood pressure Follow urine output and kidney function  OSA on CPAP -Outpatient evaluation and follow-up needed  CAD HTN Thoracic and AAA -Remains hypotensive at this time - Holding home BP meds.  ITP: platelets 97K on admission Leukocytosis: likely reactive -Platelets are stable. - No indication for transfusion. If drop below 50,000 with active bleeding can consider.   Best practice (right click and "Reselect all SmartList Selections" daily)  Diet:  NPO Pain/Anxiety/Delirium protocol (if indicated): No VAP protocol (if indicated): Not indicated DVT prophylaxis: SCD and Contraindicated GI prophylaxis: PPI  Glucose control:  SSI No Central venous access:  Yes, and it is still needed Arterial line:  N/A Foley:  N/A Mobility:  bed rest  PT consulted: N/A Last date of multidisciplinary goals of care discussion [ ]  Code Status:  full code Disposition: ICU  Labs   CBC: Recent Labs  Lab 05/24/20 2007 05/24/20 2242 05/25/20 0217 05/25/20 0332  WBC 14.8* 12.8*  --   --   NEUTROABS 11.9*  --   --   --   HGB 12.1* 10.1* 11.2* 12.0*  HCT 37.8* 31.2* 33.0* 34.2*  MCV 87.5 89.9  --   --   PLT 97* 67*  --  71*    Basic Metabolic Panel: Recent Labs  Lab 05/24/20 2007 05/25/20 0217 05/25/20 0332  NA 137 143 140  K 3.6 3.5 3.5  CL 105  --  108  CO2 22  --  21*  GLUCOSE 149*  --  208*  BUN 18  --  16  CREATININE 1.54*  --  1.28*  CALCIUM 9.1  --  7.9*  MG  --   --  1.5*  PHOS  --   --  3.1   GFR: Estimated Creatinine Clearance: 62.8  mL/min (A) (by C-G formula based on SCr of 1.28 mg/dL (H)). Recent Labs  Lab 05/24/20 2007 05/24/20 2242 05/25/20 0332  PROCALCITON  --   --  <0.10  WBC 14.8* 12.8*  --   LATICACIDVEN  --   --  2.3*    Liver Function Tests: Recent Labs  Lab 05/24/20 2007 05/25/20 0332  AST 22 26  ALT 13 18  ALKPHOS 39 32*  BILITOT 0.6 1.2  PROT 6.8 5.2*  ALBUMIN 4.0 3.0*   No results for input(s): LIPASE, AMYLASE in the last 168 hours. No results for input(s): AMMONIA in the last 168 hours.  ABG    Component Value Date/Time   PHART 7.283 (L) 05/25/2020 0217   PCO2ART 41.8 05/25/2020 0217   PO2ART 399 (H) 05/25/2020 0217   HCO3 19.9 (L) 05/25/2020 0217   TCO2 21 (L) 05/25/2020 0217   ACIDBASEDEF 7.0 (H) 05/25/2020 0217   O2SAT 100.0 05/25/2020 0217     Coagulation Profile: Recent Labs  Lab 05/24/20 2007 05/25/20 0332  INR 1.1 1.2    Cardiac Enzymes: No results for input(s): CKTOTAL, CKMB, CKMBINDEX, TROPONINI in the last 168 hours.  HbA1C: Hgb A1c MFr Bld  Date/Time Value Ref Range Status  09/11/2016 10:48 AM 5.3 4.8 - 5.6 % Final    Comment:    (NOTE) Pre diabetes:          5.7%-6.4% Diabetes:              >6.4% Glycemic control for   <7.0% adults with diabetes   04/28/2010 07:03 AM  <5.7 % Final   5.6 (NOTE)                                                                       According to the ADA Clinical Practice Recommendations for 2011, when HbA1c is used as a screening test:   >=6.5%   Diagnostic of Diabetes Mellitus           (if abnormal result  is confirmed)  5.7-6.4%   Increased risk of developing Diabetes Mellitus  References:Diagnosis and Classification of Diabetes Mellitus,Diabetes EKCM,0349,17(HXTAV 1):S62-S69 and Standards of Medical Care in         Diabetes - 2011,Diabetes WPVX,4801,65  (Suppl 1):S11-S61.    CBG: No results for input(s): GLUCAP in the last 168 hours.   This patient is critically ill with multiple organ system failure; which,  requires frequent high complexity decision making, assessment, support, evaluation, and titration of therapies. This was completed through the application of advanced monitoring technologies and extensive interpretation of multiple databases. During this encounter critical care time was devoted to patient care services described in this note for 55 minutes.  Garner Nash, DO [facility] 05/25/2020 8:32 AM

## 2020-05-25 NOTE — Progress Notes (Signed)
[facility] Progress Note Patient Name: HUNTER BACHAR DOB: May 10, 1950 MRN: [id number]   Date of Service  05/25/2020  HPI/Events of Note  Agitation  - Patient unable to sit still in nuclear medicine.   eICU Interventions  Will increase the ceiling on the Fentanyl IV infusion to 400 mcg/hour.     Intervention Category Major Interventions: Delirium, psychosis, severe agitation - evaluation and management  Allis Quirarte Eugene 05/25/2020, 6:08 AM

## 2020-05-25 NOTE — Brief Op Note (Addendum)
EGD unrevealing for a source of the GI bleed. Flexible sigmoidoscopy showed bright red blood clots and dark red blood and clots likely due to a diverticular bleed. Unable to pinpoint site due to large amount of blood in examined colon (reached descending colon).   Recommendation: Consult IR for angiogram to stop diverticular bleed that I think is still actively bleeding. If stabilizes with ongoing bleeding consider nuclear medicine bleeding scan to pinpoint part of colon bleeding. Continue aggressive volume resuscitation. D/W CCM team. D/W family at ICU.

## 2020-05-25 NOTE — Consult Note (Signed)
IR was requested for mesenteric arteriogram with possible embolization.   Patient underwent MN GI bleed study which showed "suspicious for but not confirmatory of active GI bleeding from the distal rectum."  Patient underwent subsequent CTA abdomen and pelvic this morning which was negative for acute GI hemorrhage.  Patient Hgb is stable, will proceed with  mesenteric arteriogram with possible embolization if patient rebleeds.   Please call IR for questions or concerns.    Armando Gang Dajour [place] PA-C 05/25/2020 10:44 AM

## 2020-05-25 NOTE — Op Note (Addendum)
[facility] Patient Name: Lonnie Hansen Procedure Date : 05/25/2020 MRN: [id number] Attending MD: [name] , MD Date of Birth: [birth year] CSN: [id number] Age: 70 Admit Type: Inpatient Procedure:                Flexible Sigmoidoscopy Indications:              Hematochezia Providers:                [name], MD, Clyde Lundborg, RN, Tyrone Apple, Technician Referring MD:             hospital team Medicines:                Fentanyl drip and Ketamine drip per CCM team Complications:            No immediate complications. Estimated Blood Loss:     active bleeding prior to insertion of endoscope. Procedure:                Pre-Anesthesia Assessment:                           - Prior to the procedure, a History and Physical                            was performed, and patient medications and                            allergies were reviewed. The patient's tolerance of                            previous anesthesia was also reviewed. The risks                            and benefits of the procedure and the sedation                            options and risks were discussed with the patient.                            All questions were answered, and informed consent                            was obtained. Prior Anticoagulants: The patient has                            taken no previous anticoagulant or antiplatelet                            agents. ASA Grade Assessment: IV - A patient with                            severe systemic disease that is a constant threat  to life. After reviewing the risks and benefits,                            the patient was deemed in satisfactory condition to                            undergo the procedure.                           After obtaining informed consent, the scope was                            passed under direct vision. The [id number]-[id number] ([id number])                             Olympus gastroscope was introduced through the anus                            and advanced to the the descending colon. The                            flexible sigmoidoscopy was performed with                            difficulty due to excessive bleeding. The patient                            tolerated the procedure well. The quality of the                            bowel preparation was an unprepped procedure. Scope In: 1:29:24 AM Scope Out: 1:37:09 AM Total Procedure Duration: 0 hours 7 minutes 45 seconds  Findings:      Hemorrhoids were found on perianal exam.      Red blood was found in the rectum, in the recto-sigmoid colon, in the       sigmoid colon and in the descending colon.      Multiple small and large-mouthed diverticula were found in the sigmoid       colon.      Large amount of bright red blood clots and dark red clots and blood       preventing visualization of a source of the bleeding. Suspect a       diverticular bleed but site not pinpointed. Due to copious amounts of       blood procedure was aborted. Impression:               - Hemorrhoids found on perianal exam.                           - Blood in the rectum, in the recto-sigmoid colon,                            in the sigmoid colon and in the descending colon.                           -  Diverticulosis in the sigmoid colon.                           - No specimens collected. Recommendation:           - Bleeding scan to pinpoint area of colon bleeding                            vs consult IR for angiogram for ongoing bleeding                            that is likely diverticular in origin.                           - Volume resuscitation. Procedure Code(s):        --- Professional ---                           [phone], Sigmoidoscopy, flexible; diagnostic,                            including collection of specimen(s) by brushing or                            washing, when performed  (separate procedure) Diagnosis Code(s):        --- Professional ---                           K92.1, Melena (includes Hematochezia)                           K92.2, Gastrointestinal hemorrhage, unspecified                           K62.5, Hemorrhage of anus and rectum                           K64.9, Unspecified hemorrhoids                           K57.30, Diverticulosis of large intestine without                            perforation or abscess without bleeding CPT copyright 2019 American Medical Association. All rights reserved. The codes documented in this report are preliminary and upon coder review may  be revised to meet current compliance requirements. [name], MD 05/25/2020 1:59:10 AM This report has been signed electronically. Number of Addenda: 0

## 2020-05-25 NOTE — Progress Notes (Signed)
[place] Progress Note Patient Name: Lonnie Hansen DOB: 05/08/1950 MRN: [id number]   Date of Service  05/25/2020  HPI/Events of Note  Hypokalemia  Hypomagnesemia - K+ = 3.6, Mg++ = 1.8 and Creatinine = 1.29.  eICU Interventions  Will replace K+ and Mg++.     Intervention Category Major Interventions: Electrolyte abnormality - evaluation and management  Selam Pietsch Eugene 05/25/2020, 10:18 PM

## 2020-05-25 NOTE — Significant Event (Cosign Needed)
PCCM INTERVAL PROGRESS NOTE  Called to bedside for cardiac arrest. Patient had large bloody BM and likely suffered a vasovagal response leading to cardiac arrest. Upon my arrival to bedside patient in PEA arrest with compressions ongoing. 1mg  epinephrine administered during code. ROSC at 1st pulse check. Patient awake and talking. Patient did require intubation for stabilization and anesthesia who responded to the code intubated the patient.   Patient hypotensive post arrest and levophed initiated. MTP initiated as well.   Plan: Rapid transfuse Mass transfusion protocol Give 2,2,2, now plus 1 pool cryo Calcium x 1 Frequent CBC  CPR Additional critical care unrelated to CPR 42 minutes   Georgann Housekeeper, AGACNP-BC [place] for personal pager PCCM on call pager [phone] until 7pm. Please call Elink 7p-7a. [phone]  05/25/2020 12:28 AM

## 2020-05-25 NOTE — Progress Notes (Signed)
Assisted with MTP after Code Blue Resus

## 2020-05-25 NOTE — Progress Notes (Signed)
Code [employer]. Met family and took them to the waiting area after they saw Eulan code.  Doctor came out and shared what happened and we had prayer.  Family is in 2 Heart Waiting area and was told they can request me to come back by asking me to be paged Family calmed down and asked for [rayers that Fardeen will be able to calm down from the frightening experience. Conard Novak, Chaplain   05/25/20 0000  Clinical Encounter Type  Visited With Family;Patient not available  Visit Type Code  Referral From Care management  Consult/Referral To Chaplain  Spiritual Encounters  Spiritual Needs Prayer  Stress Factors  Family Stress Factors Health changes

## 2020-05-25 NOTE — Progress Notes (Signed)
Upon arrival to the ICU, patient stated he did not feel well. Heart rate dropped from 70s to 40s. Patient became unresponsive and pulseless. CPR initiated. 1 epi given. ROSC obtained after 2 minutes. Patient then sedated and intubated for EGD procedure at bedside. 1 amp calcium gluconate given per MD order.  Multiple blood products given emergently.   All blood verified by two nurses.  [id number] Plasma 0100-0105 [id number] RBC 0110-0116 [id number] Plasma [phone] [id number] Platelets 0123-0128 [id number] RBC 0103-0111 [id number] RBC 0050- 0101 [id number] RBC 0049- 5430 [id number] Cryo [id number]   Blood wasted due to Larene Pickett would not infuse. [id number] RBC 0030- 3406

## 2020-05-25 NOTE — Progress Notes (Signed)
[facility] Progress Note Patient Name: Lonnie Hansen DOB: 1950-05-09 MRN: [id number]   Date of Service  05/25/2020  HPI/Events of Note  Nursing reports frequent PVC's.   eICU Interventions  Plan: 1. BMP and Mg++ level STAT.     Intervention Category Major Interventions: Arrhythmia - evaluation and management  Kadeja Granada Cornelia Copa 05/25/2020, 8:51 PM

## 2020-05-25 NOTE — Interval H&P Note (Signed)
History and Physical Interval Note:  05/25/2020 12:52 AM  Lonnie Hansen  has presented today for surgery, with the diagnosis of Gastrointestinal bleed.  The various methods of treatment have been discussed with the patient and family. After consideration of risks, benefits and other options for treatment, the patient has consented to  Procedure(s): ESOPHAGOGASTRODUODENOSCOPY (EGD) (N/A) FLEXIBLE SIGMOIDOSCOPY (N/A) as a surgical intervention.  The patient's history has been reviewed, patient examined, no change in status, stable for surgery.  I have reviewed the patient's chart and labs.  Questions were answered to the patient's satisfaction.     [name]

## 2020-05-25 NOTE — Plan of Care (Signed)
  Problem: Clinical Measurements: Goal: Will remain free from infection Outcome: Progressing Goal: Diagnostic test results will improve Outcome: Progressing Goal: Respiratory complications will improve Outcome: Progressing Goal: Cardiovascular complication will be avoided Outcome: Progressing   Problem: Activity: Goal: Risk for activity intolerance will decrease Outcome: Progressing   Problem: Safety: Goal: Ability to remain free from injury will improve Outcome: Progressing   Problem: Skin Integrity: Goal: Risk for impaired skin integrity will decrease Outcome: Progressing   Problem: Nutrition: Goal: Adequate nutrition will be maintained Outcome: Not Progressing   Problem: Coping: Goal: Level of anxiety will decrease Outcome: Not Progressing   Problem: Elimination: Goal: Will not experience complications related to bowel motility Outcome: Not Progressing Goal: Will not experience complications related to urinary retention Outcome: Not Progressing   Problem: Pain Managment: Goal: General experience of comfort will improve Outcome: Not Progressing

## 2020-05-25 NOTE — Progress Notes (Signed)
RT transported patient from 2H17 to CT and back with RN. No complications. RT will continue to monitor.

## 2020-05-25 NOTE — H&P (View-Only) (Signed)
Referring Provider: Dr. Earlie Server Primary Care Physician:  [name], MD Primary Gastroenterologist:  Dr. Watt Climes  Reason for Consultation:  GI Bleed  HPI: Lonnie Hansen is a 69 y.o. male with acute onset of multiple episodes of BRBPR in the setting of ulcerative colitis and ITP. Hypotensive in the 29'B systolic with response to IVFs. Nauseous without vomiting. Lower abdominal pain prior to each bloody stool. History of diverticular bleeding in March 2021 with colonoscopy that showed diffuse diverticulosis and hemorrhoids and EGD was unrevealing. Hgb 12.1 initially and 10.1 on subsequent check.   Past Medical History:  Diagnosis Date  . AAA (abdominal aortic aneurysm) ([place])   . Anemia   . Arthritis   . Asymptomatic bilateral carotid artery stenosis 08/2015   1-39%   . Blood dyscrasia    "trouble with my bloods clotting"  . Bruising    on the skin states due to platelets or sometimes low or high  . Cataracts, bilateral   . 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 Coronary artery disease   . Diverticulosis   . Enlarged aorta ([place])   . Enlarged prostate    slightly  . GERD (gastroesophageal reflux disease)    takes Pantoprazole daily as needed  . Glaucoma    uses eye drops daily  . Headache   . History of colon polyps    benign  . History of kidney stones   . Hyperlipidemia    no on any meds  . Hypertension    takes Amlodipine and Atenolol daily  . Joint pain   . Joint swelling   . Nocturia   . OSA on CPAP   . Sleep apnea   . Vocal cord nodule    pt. states  it's a" growth on vocal cord"    Past Surgical History:  Procedure Laterality Date  . AORTIC ARCH ANGIOGRAPHY N/A 04/16/2016   Procedure: Aortic Arch Angiography;  Surgeon: [name] Martinique, MD;  Location: [place] [facility];  Service: Cardiovascular;  Laterality: N/A;  . AORTIC VALVE REPLACEMENT N/A 09/15/2016   Procedure: AORTIC VALVE REPLACEMENT (AVR);  Surgeon: Grace Isaac, MD;   Location: [facility];  Service: Open Heart Surgery;  Laterality: N/A;  Using 31mm Edwards Perimount Magna Ease Aortic Bioprosthesis Valve  . ASCENDING AORTIC ROOT REPLACEMENT N/A 09/15/2016   Procedure: ASCENDING AORTIC ROOT REPLACEMENT;  Surgeon: Grace Isaac, MD;  Location: [facility];  Service: Open Heart Surgery;  Laterality: N/A;  Using 62mm Gelweave Valsalva Graft  . COLONOSCOPY    . COLONOSCOPY Left 04/16/2019   Procedure: COLONOSCOPY;  Surgeon: Arta Silence, MD;  Location: [facility] ENDOSCOPY;  Service: Endoscopy;  Laterality: Left;  . COLONOSCOPY WITH ESOPHAGOGASTRODUODENOSCOPY (EGD)    . ESOPHAGOGASTRODUODENOSCOPY (EGD) WITH PROPOFOL Left 04/16/2019   Procedure: ESOPHAGOGASTRODUODENOSCOPY (EGD) WITH PROPOFOL;  Surgeon: Arta Silence, MD;  Location: [facility];  Service: Endoscopy;  Laterality: Left;  . IR THORACENTESIS ASP PLEURAL SPACE W/IMG GUIDE  10/23/2016  . MICROLARYNGOSCOPY Right 09/20/2017   Procedure: MICROLARYNGOSCOPY WITH  EXCISION OF VOCAL CORD LESION;  Surgeon: Leta Baptist, MD;  Location: [facility];  Service: ENT;  Laterality: Right;  . MULTIPLE EXTRACTIONS WITH ALVEOLOPLASTY N/A 06/10/2016   Procedure: Extraction of tooth #'s 2,8,13,15, and 29  with alveoloplasty, maxillary right and left buccal exostoses reductions, and gross debridement of remaining teeth.;  Surgeon: Lenn Cal, DDS;  Location: [place];  Service: Oral Surgery;  Laterality: N/A;  . RIGHT/LEFT HEART CATH AND CORONARY ANGIOGRAPHY  N/A 04/16/2016   Procedure: Right/Left Heart Cath and Coronary Angiography;  Surgeon: [name] Martinique, MD;  Location: [place] [facility];  Service: Cardiovascular;  Laterality: N/A;  . TEE WITHOUT CARDIOVERSION N/A 09/15/2016   Procedure: TRANSESOPHAGEAL ECHOCARDIOGRAM (TEE);  Surgeon: Grace Isaac, MD;  Location: [place];  Service: Open Heart Surgery;  Laterality: N/A;    Prior to Admission medications   Medication Sig Start Date End Date Taking? Authorizing Provider   amLODipine (NORVASC) 10 MG tablet Take 10 mg by mouth daily.    [provider]  aspirin EC 81 MG EC tablet Take 1 tablet (81 mg total) by mouth daily. 09/24/16   Gold, Wayne E, PA-C  atenolol (TENORMIN) 25 MG tablet Take 0.5 tablets (12.5 mg total) by mouth 2 (two) times daily. 09/23/16   Gold, Patrick Jupiter E, PA-C  Ensure Plus (ENSURE PLUS) LIQD Take 237 mLs by mouth.    [provider]  fluticasone (FLONASE) 50 MCG/ACT nasal spray 1 spray in each nostril 04/16/20   [provider]  latanoprost (XALATAN) 0.005 % ophthalmic solution Place 1 drop into both eyes at bedtime.    [provider]  Multiple Vitamin (MULTIVITAMIN WITH MINERALS) TABS tablet Take 1 tablet by mouth daily.    [provider]  omega-3 acid ethyl esters (LOVAZA) 1 g capsule Take 2 capsules (2 g total) by mouth 2 (two) times daily. 05/12/19   Deberah Pelton, NP  pantoprazole (PROTONIX) 40 MG tablet Take 40 mg by mouth daily.  03/11/19   [provider]  rosuvastatin (CRESTOR) 10 MG tablet Take 1 tablet by mouth once daily 05/17/20   Martinique, [name], MD  tamsulosin [facility]) 0.4 MG CAPS capsule Take 1 capsule by mouth once daily (to improve urine flow) 12/11/13   [provider]    Scheduled Meds: . Chlorhexidine Gluconate Cloth  6 each Topical Daily  . [START ON 05/28/2020] pantoprazole  40 mg Intravenous Q12H  . pantoprazole (PROTONIX) IV  40 mg Intravenous Once  . sodium chloride flush  10-40 mL Intracatheter Q12H   Continuous Infusions: . sodium chloride    . sodium chloride    . norepinephrine (LEVOPHED) Adult infusion 2 mcg/min (05/24/20 2308)  . pantoprozole (PROTONIX) infusion    . pantoprazole (PROTONIX) 80 mg IVPB     PRN Meds:.sodium chloride flush  Allergies as of 05/24/2020  . (No Known Allergies)    Family History  Problem Relation Age of Onset  . Heart disease Father   . Heart attack Father   . Thyroid disease Sister     Social History    Socioeconomic History  . Marital status: Married    Spouse name: Not on file  . Number of children: 1  . Years of education: Not on file  . Highest education level: Not on file  Occupational History  . Occupation: Musician  Tobacco Use  . Smoking status: Former Smoker    Packs/day: 1.00    Years: 25.00    Pack years: 25.00    Types: Cigarettes  . Smokeless tobacco: Never Used  Vaping Use  . Vaping Use: Never used  Substance and Sexual Activity  . Alcohol use: No    Alcohol/week: 0.0 standard drinks  . Drug use: No  . Sexual activity: Not on file  Other Topics Concern  . 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  Social Determinants of Health   Financial Resource Strain: Not on file  Food Insecurity: Not on file  Transportation Needs: Not on file  Physical Activity: Not on file  Stress: Not on file  Social Connections: Not on file  Intimate Partner Violence: Not on file    Review of Systems: All negative except as stated above in HPI.  Physical Exam: Vital signs: Vitals:   05/24/20 2306 05/24/20 2315  BP: 92/70 90/73  Pulse: 68 (!) 58  Resp: 13 20  Temp:    SpO2: 100% 100%  T 98.5  General:   Lethargic, Well-developed, well-nourished, pleasant and cooperative in NAD Head: normocephalic, atraumatic Eyes: anicteric sclera ENT: oropharynx clear, poor dentition Neck: supple, nontender Lungs:  Clear throughout to auscultation.   No wheezes, crackles, or rhonchi. No acute distress. Heart:  Regular rate and rhythm; no murmurs, clicks, rubs,  or gallops. Abdomen: lower abdominal tenderness with guarding, soft, nondistended, +BS  Rectal:  Deferred Ext: no edema GI:  Lab Results: Recent Labs    05/24/20 2007 05/24/20 2242  WBC 14.8* 12.8*  HGB 12.1* 10.1*  HCT 37.8* 31.2*  PLT 97* 67*   BMET Recent Labs    05/24/20 2007  NA 137  K 3.6  CL 105  CO2 22  GLUCOSE 149*  BUN 18  CREATININE 1.54*   CALCIUM 9.1   LFT Recent Labs    05/24/20 2007  PROT 6.8  ALBUMIN 4.0  AST 22  ALT 13  ALKPHOS 39  BILITOT 0.6   PT/INR Recent Labs    05/24/20 2007  LABPROT 14.1  INR 1.1     Studies/Results: DG Chest Port 1 View  Result Date: 05/24/2020 CLINICAL DATA:  GI bleed, syncope. EXAM: PORTABLE CHEST 1 VIEW COMPARISON:  October 31, 2018 FINDINGS: Multiple sternal wires are noted. The heart size and mediastinal contours are within normal limits. An artificial aortic valve is seen. Both lungs are clear. The visualized skeletal structures are unremarkable. IMPRESSION: No active disease. Electronically Signed   By: Virgina Norfolk M.D.   On: 05/24/2020 21:31   Impression/Plan: 70 yo with BRBPR likely due to a diverticular bleed but due to the hemodynamic instability will do an EGD to rule out peptic ulcer disease and if unrevealing do an unprepped flex sig. If no source on EGD then may need an angiogram by IR to further evaluate. Protonix drip. NPO. Supportive care.    LOS: 1 day   [name]  05/25/2020, 12:05 AM  Questions please call [phone]

## 2020-05-25 NOTE — Anesthesia Procedure Notes (Signed)
Procedure Name: Intubation Date/Time: 05/25/2020 12:22 AM Performed by: Clovis Cao, CRNA Pre-anesthesia Checklist: Patient identified, Emergency Drugs available, Suction available, Patient being monitored and Timeout performed Patient Re-evaluated:Patient Re-evaluated prior to induction Oxygen Delivery Method: Ambu bag Preoxygenation: Pre-oxygenation with 100% oxygen Induction Type: IV induction, Rapid sequence and Cricoid Pressure applied Laryngoscope Size: Glidescope and 4 Grade View: Grade I Tube type: Oral Tube size: 7.5 mm Number of attempts: 1 Airway Equipment and Method: Stylet and Video-laryngoscopy Placement Confirmation: ETT inserted through vocal cords under direct vision,  CO2 detector and breath sounds checked- equal and bilateral Secured at: 22 cm Tube secured with: Tape Dental Injury: Teeth and Oropharynx as per pre-operative assessment

## 2020-05-25 NOTE — Consult Note (Signed)
Referring Provider: Dr. Earlie Server Primary Care Physician:  [name], MD Primary Gastroenterologist:  Dr. Watt Climes  Reason for Consultation:  GI Bleed  HPI: Lonnie Hansen is a 70 y.o. male with acute onset of multiple episodes of BRBPR in the setting of ulcerative colitis and ITP. Hypotensive in the 86'P systolic with response to IVFs. Nauseous without vomiting. Lower abdominal pain prior to each bloody stool. History of diverticular bleeding in March 2021 with colonoscopy that showed diffuse diverticulosis and hemorrhoids and EGD was unrevealing. Hgb 12.1 initially and 10.1 on subsequent check.   Past Medical History:  Diagnosis Date  . AAA (abdominal aortic aneurysm) ([place])   . Anemia   . Arthritis   . Asymptomatic bilateral carotid artery stenosis 08/2015   1-39%   . Blood dyscrasia    "trouble with my bloods clotting"  . Bruising    on the skin states due to platelets or sometimes low or high  . Cataracts, bilateral   . 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 Coronary artery disease   . Diverticulosis   . Enlarged aorta ([place])   . Enlarged prostate    slightly  . GERD (gastroesophageal reflux disease)    takes Pantoprazole daily as needed  . Glaucoma    uses eye drops daily  . Headache   . History of colon polyps    benign  . History of kidney stones   . Hyperlipidemia    no on any meds  . Hypertension    takes Amlodipine and Atenolol daily  . Joint pain   . Joint swelling   . Nocturia   . OSA on CPAP   . Sleep apnea   . Vocal cord nodule    pt. states  it's a" growth on vocal cord"    Past Surgical History:  Procedure Laterality Date  . AORTIC ARCH ANGIOGRAPHY N/A 04/16/2016   Procedure: Aortic Arch Angiography;  Surgeon: [name] Martinique, MD;  Location: [place] [facility];  Service: Cardiovascular;  Laterality: N/A;  . AORTIC VALVE REPLACEMENT N/A 09/15/2016   Procedure: AORTIC VALVE REPLACEMENT (AVR);  Surgeon: Grace Isaac, MD;   Location: [facility];  Service: Open Heart Surgery;  Laterality: N/A;  Using 6mm Edwards Perimount Magna Ease Aortic Bioprosthesis Valve  . ASCENDING AORTIC ROOT REPLACEMENT N/A 09/15/2016   Procedure: ASCENDING AORTIC ROOT REPLACEMENT;  Surgeon: Grace Isaac, MD;  Location: [facility];  Service: Open Heart Surgery;  Laterality: N/A;  Using 57mm Gelweave Valsalva Graft  . COLONOSCOPY    . COLONOSCOPY Left 04/16/2019   Procedure: COLONOSCOPY;  Surgeon: Arta Silence, MD;  Location: [facility] ENDOSCOPY;  Service: Endoscopy;  Laterality: Left;  . COLONOSCOPY WITH ESOPHAGOGASTRODUODENOSCOPY (EGD)    . ESOPHAGOGASTRODUODENOSCOPY (EGD) WITH PROPOFOL Left 04/16/2019   Procedure: ESOPHAGOGASTRODUODENOSCOPY (EGD) WITH PROPOFOL;  Surgeon: Arta Silence, MD;  Location: [place];  Service: Endoscopy;  Laterality: Left;  . IR THORACENTESIS ASP PLEURAL SPACE W/IMG GUIDE  10/23/2016  . MICROLARYNGOSCOPY Right 09/20/2017   Procedure: MICROLARYNGOSCOPY WITH  EXCISION OF VOCAL CORD LESION;  Surgeon: Leta Baptist, MD;  Location: [place];  Service: ENT;  Laterality: Right;  . MULTIPLE EXTRACTIONS WITH ALVEOLOPLASTY N/A 06/10/2016   Procedure: Extraction of tooth #'s 2,8,13,15, and 29  with alveoloplasty, maxillary right and left buccal exostoses reductions, and gross debridement of remaining teeth.;  Surgeon: Lenn Cal, DDS;  Location: [place];  Service: Oral Surgery;  Laterality: N/A;  . RIGHT/LEFT HEART CATH AND CORONARY ANGIOGRAPHY  N/A 04/16/2016   Procedure: Right/Left Heart Cath and Coronary Angiography;  Surgeon: [name] Martinique, MD;  Location: [place] [facility];  Service: Cardiovascular;  Laterality: N/A;  . TEE WITHOUT CARDIOVERSION N/A 09/15/2016   Procedure: TRANSESOPHAGEAL ECHOCARDIOGRAM (TEE);  Surgeon: Grace Isaac, MD;  Location: [place];  Service: Open Heart Surgery;  Laterality: N/A;    Prior to Admission medications   Medication Sig Start Date End Date Taking? Authorizing Provider   amLODipine (NORVASC) 10 MG tablet Take 10 mg by mouth daily.    [provider]  aspirin EC 81 MG EC tablet Take 1 tablet (81 mg total) by mouth daily. 09/24/16   Gold, Wayne E, PA-C  atenolol (TENORMIN) 25 MG tablet Take 0.5 tablets (12.5 mg total) by mouth 2 (two) times daily. 09/23/16   Gold, Patrick Jupiter E, PA-C  Ensure Plus (ENSURE PLUS) LIQD Take 237 mLs by mouth.    [provider]  fluticasone (FLONASE) 50 MCG/ACT nasal spray 1 spray in each nostril 04/16/20   [provider]  latanoprost (XALATAN) 0.005 % ophthalmic solution Place 1 drop into both eyes at bedtime.    [provider]  Multiple Vitamin (MULTIVITAMIN WITH MINERALS) TABS tablet Take 1 tablet by mouth daily.    [provider]  omega-3 acid ethyl esters (LOVAZA) 1 g capsule Take 2 capsules (2 g total) by mouth 2 (two) times daily. 05/12/19   Deberah Pelton, NP  pantoprazole (PROTONIX) 40 MG tablet Take 40 mg by mouth daily.  03/11/19   [provider]  rosuvastatin (CRESTOR) 10 MG tablet Take 1 tablet by mouth once daily 05/17/20   Martinique, [name], MD  tamsulosin [facility]) 0.4 MG CAPS capsule Take 1 capsule by mouth once daily (to improve urine flow) 12/11/13   [provider]    Scheduled Meds: . Chlorhexidine Gluconate Cloth  6 each Topical Daily  . [START ON 05/28/2020] pantoprazole  40 mg Intravenous Q12H  . pantoprazole (PROTONIX) IV  40 mg Intravenous Once  . sodium chloride flush  10-40 mL Intracatheter Q12H   Continuous Infusions: . sodium chloride    . sodium chloride    . norepinephrine (LEVOPHED) Adult infusion 2 mcg/min (05/24/20 2308)  . pantoprozole (PROTONIX) infusion    . pantoprazole (PROTONIX) 80 mg IVPB     PRN Meds:.sodium chloride flush  Allergies as of 05/24/2020  . (No Known Allergies)    Family History  Problem Relation Age of Onset  . Heart disease Father   . Heart attack Father   . Thyroid disease Sister     Social History    Socioeconomic History  . Marital status: Married    Spouse name: Not on file  . Number of children: 1  . Years of education: Not on file  . Highest education level: Not on file  Occupational History  . Occupation: Musician  Tobacco Use  . Smoking status: Former Smoker    Packs/day: 1.00    Years: 25.00    Pack years: 25.00    Types: Cigarettes  . Smokeless tobacco: Never Used  Vaping Use  . Vaping Use: Never used  Substance and Sexual Activity  . Alcohol use: No    Alcohol/week: 0.0 standard drinks  . Drug use: No  . Sexual activity: Not on file  Other Topics Concern  . 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  Social Determinants of Health   Financial Resource Strain: Not on file  Food Insecurity: Not on file  Transportation Needs: Not on file  Physical Activity: Not on file  Stress: Not on file  Social Connections: Not on file  Intimate Partner Violence: Not on file    Review of Systems: All negative except as stated above in HPI.  Physical Exam: Vital signs: Vitals:   05/24/20 2306 05/24/20 2315  BP: 92/70 90/73  Pulse: 68 (!) 58  Resp: 13 20  Temp:    SpO2: 100% 100%  T 98.5  General:   Lethargic, Well-developed, well-nourished, pleasant and cooperative in NAD Head: normocephalic, atraumatic Eyes: anicteric sclera ENT: oropharynx clear, poor dentition Neck: supple, nontender Lungs:  Clear throughout to auscultation.   No wheezes, crackles, or rhonchi. No acute distress. Heart:  Regular rate and rhythm; no murmurs, clicks, rubs,  or gallops. Abdomen: lower abdominal tenderness with guarding, soft, nondistended, +BS  Rectal:  Deferred Ext: no edema GI:  Lab Results: Recent Labs    05/24/20 2007 05/24/20 2242  WBC 14.8* 12.8*  HGB 12.1* 10.1*  HCT 37.8* 31.2*  PLT 97* 67*   BMET Recent Labs    05/24/20 2007  NA 137  K 3.6  CL 105  CO2 22  GLUCOSE 149*  BUN 18  CREATININE 1.54*   CALCIUM 9.1   LFT Recent Labs    05/24/20 2007  PROT 6.8  ALBUMIN 4.0  AST 22  ALT 13  ALKPHOS 39  BILITOT 0.6   PT/INR Recent Labs    05/24/20 2007  LABPROT 14.1  INR 1.1     Studies/Results: DG Chest Port 1 View  Result Date: 05/24/2020 CLINICAL DATA:  GI bleed, syncope. EXAM: PORTABLE CHEST 1 VIEW COMPARISON:  October 31, 2018 FINDINGS: Multiple sternal wires are noted. The heart size and mediastinal contours are within normal limits. An artificial aortic valve is seen. Both lungs are clear. The visualized skeletal structures are unremarkable. IMPRESSION: No active disease. Electronically Signed   By: Virgina Norfolk M.D.   On: 05/24/2020 21:31   Impression/Plan: 71 yo with BRBPR likely due to a diverticular bleed but due to the hemodynamic instability will do an EGD to rule out peptic ulcer disease and if unrevealing do an unprepped flex sig. If no source on EGD then may need an angiogram by IR to further evaluate. Protonix drip. NPO. Supportive care.    LOS: 1 day   [name]  05/25/2020, 12:05 AM  Questions please call [phone]

## 2020-05-26 DIAGNOSIS — I469 Cardiac arrest, cause unspecified: Secondary | ICD-10-CM | POA: Diagnosis not present

## 2020-05-26 DIAGNOSIS — N179 Acute kidney failure, unspecified: Secondary | ICD-10-CM | POA: Diagnosis not present

## 2020-05-26 LAB — PREPARE PLATELET PHERESIS: Unit division: 0

## 2020-05-26 LAB — BPAM FFP
Blood Product Expiration Date: 202205032359
Blood Product Expiration Date: 202205032359
Blood Product Expiration Date: 202205032359
Blood Product Expiration Date: 202205032359
Blood Product Expiration Date: 202205052359
Blood Product Expiration Date: 202205052359
Blood Product Expiration Date: 202205052359
Blood Product Expiration Date: 202205052359
ISSUE DATE / TIME: 202204300031
ISSUE DATE / TIME: 202204300031
ISSUE DATE / TIME: 202204300851
ISSUE DATE / TIME: 202204300851
ISSUE DATE / TIME: 202204301151
ISSUE DATE / TIME: 202204301151
Unit Type and Rh: 600
Unit Type and Rh: 6200
Unit Type and Rh: 6200
Unit Type and Rh: 6200
Unit Type and Rh: 8400
Unit Type and Rh: 8400
Unit Type and Rh: 8400
Unit Type and Rh: 8400

## 2020-05-26 LAB — PREPARE FRESH FROZEN PLASMA
Unit division: 0
Unit division: 0
Unit division: 0
Unit division: 0
Unit division: 0

## 2020-05-26 LAB — CBC
HCT: 28 % — ABNORMAL LOW (ref 39.0–52.0)
HCT: 28.5 % — ABNORMAL LOW (ref 39.0–52.0)
Hemoglobin: 9.3 g/dL — ABNORMAL LOW (ref 13.0–17.0)
Hemoglobin: 9.7 g/dL — ABNORMAL LOW (ref 13.0–17.0)
MCH: 29.8 pg (ref 26.0–34.0)
MCH: 30.1 pg (ref 26.0–34.0)
MCHC: 33.2 g/dL (ref 30.0–36.0)
MCHC: 34 g/dL (ref 30.0–36.0)
MCV: 88.5 fL (ref 80.0–100.0)
MCV: 89.7 fL (ref 80.0–100.0)
Platelets: 51 10*3/uL — ABNORMAL LOW (ref 150–400)
Platelets: 57 10*3/uL — ABNORMAL LOW (ref 150–400)
RBC: 3.12 MIL/uL — ABNORMAL LOW (ref 4.22–5.81)
RBC: 3.22 MIL/uL — ABNORMAL LOW (ref 4.22–5.81)
RDW: 17.4 % — ABNORMAL HIGH (ref 11.5–15.5)
RDW: 17.5 % — ABNORMAL HIGH (ref 11.5–15.5)
WBC: 12 10*3/uL — ABNORMAL HIGH (ref 4.0–10.5)
WBC: 13.2 10*3/uL — ABNORMAL HIGH (ref 4.0–10.5)
nRBC: 0 % (ref 0.0–0.2)
nRBC: 0 % (ref 0.0–0.2)

## 2020-05-26 LAB — PREPARE CRYOPRECIPITATE: Unit division: 0

## 2020-05-26 LAB — BPAM PLATELET PHERESIS
Blood Product Expiration Date: 202205012359
ISSUE DATE / TIME: 202204300036
Unit Type and Rh: 7300

## 2020-05-26 LAB — TRIGLYCERIDES: Triglycerides: 201 mg/dL — ABNORMAL HIGH (ref <150)

## 2020-05-26 LAB — GLUCOSE, CAPILLARY: Glucose-Capillary: 106 mg/dL — ABNORMAL HIGH (ref 70–99)

## 2020-05-26 LAB — BPAM CRYOPRECIPITATE
Blood Product Expiration Date: 202204300640
ISSUE DATE / TIME: 202204300107
Unit Type and Rh: 6200

## 2020-05-26 LAB — BASIC METABOLIC PANEL
Anion gap: 8 (ref 5–15)
BUN: 12 mg/dL (ref 8–23)
CO2: 24 mmol/L (ref 22–32)
Calcium: 7.6 mg/dL — ABNORMAL LOW (ref 8.9–10.3)
Chloride: 108 mmol/L (ref 98–111)
Creatinine, Ser: 1.05 mg/dL (ref 0.61–1.24)
GFR, Estimated: >60 mL/min (ref >60)
Glucose, Bld: 106 mg/dL — ABNORMAL HIGH (ref 70–99)
Potassium: 3.4 mmol/L — ABNORMAL LOW (ref 3.5–5.1)
Sodium: 140 mmol/L (ref 135–145)

## 2020-05-26 LAB — MAGNESIUM: Magnesium: 2 mg/dL (ref 1.7–2.4)

## 2020-05-26 MED ORDER — POTASSIUM CHLORIDE 10 MEQ/50ML IV SOLN
10.0000 meq | INTRAVENOUS | Status: AC
  Administered 2020-05-26 – 2020-05-27 (×4): 10 meq via INTRAVENOUS
  Filled 2020-05-26 (×4): qty 50

## 2020-05-26 MED ORDER — ACETAMINOPHEN 325 MG PO TABS
650.0000 mg | ORAL_TABLET | Freq: Four times a day (QID) | ORAL | Status: DC | PRN
  Administered 2020-05-26 – 2020-05-29 (×4): 650 mg via ORAL
  Filled 2020-05-26 (×4): qty 2

## 2020-05-26 NOTE — Progress Notes (Addendum)
[place] Progress Note Patient Name: Lonnie Hansen DOB: December 02, 1950 MRN: [id number]   Date of Service  05/26/2020  HPI/Events of Note  RN would like order for BMP with mag, noted PVCs with no BMP today and diuresed 1320 today  eICU Interventions  Ordered. Call with results     Intervention Category Intermediate Interventions: Other:  Margaretmary Lombard 05/26/2020, 9:53 PM   11:15 pm 40 meq IV kcl x 1. Mag is ok Request for oral tylenol - PRN order placed

## 2020-05-26 NOTE — Progress Notes (Signed)
[facility] Gastroenterology Progress Note  MORLEY GAUMER 70 y.o. [birth year]   Subjective: Sitting up in bed. Staff about to extubate when I saw him this morning. Nurse reports no bleeding since yesterday afternoon.  Objective: Vital signs: Vitals:   05/26/20 0800 05/26/20 1030  BP: 117/66 133/83  Pulse: 68 (!) 108  Resp: 14 18  Temp: 99.5 F (37.5 C) 99.86 F (37.7 C)  SpO2: 97% 98%    Physical Exam: Gen: lethargic, elderly, well-nourished, intubated, no acute distress  HEENT: anicteric sclera CV: RRR Chest: CTA B Abd: soft, nontender, nondistended, +BS Ext: no edema  Lab Results: Recent Labs    05/25/20 0332 05/25/20 1157 05/25/20 2111  NA 140 143 140  K 3.5 4.1 3.6  CL 108 109 114*  CO2 21*  --  22  GLUCOSE 208* 133* 131*  BUN 16 19 18   CREATININE 1.28* 1.20 1.29*  CALCIUM 7.9*  --  7.4*  MG 1.5*  --  1.8  PHOS 3.1  --   --    Recent Labs    05/24/20 2007 05/25/20 0332  AST 22 26  ALT 13 18  ALKPHOS 39 32*  BILITOT 0.6 1.2  PROT 6.8 5.2*  ALBUMIN 4.0 3.0*   Recent Labs    05/24/20 2007 05/24/20 2242 05/25/20 2345 05/26/20 0439  WBC 14.8*   < > 12.0* 13.2*  NEUTROABS 11.9*  --   --   --   HGB 12.1*   < > 9.7* 9.3*  HCT 37.8*   < > 28.5* 28.0*  MCV 87.5   < > 88.5 89.7  PLT 97*   < > 57* 51*   < > = values in this interval not displayed.      Assessment/Plan: Lower GI bleed likely diverticular that seems to have resolved. Hgb 9.3 (9.7). If rebleeding occurs, then repeat CT angiogram needed. Supportive care. Advance diet per CCM recs. [facility] will sign off. Call us back if questions.   [name] 05/26/2020, 11:29 AM  Questions please call [phone] ID: [name], male   DOB: [birth year], 70 y.o.   MRN: [id number]

## 2020-05-26 NOTE — Procedures (Signed)
Extubation Procedure Note  Patient Details:   Name: Lonnie Hansen DOB: [birth year] MRN: [id number]   Airway Documentation:    Vent end date: 05/26/20 Vent end time: 1030   Evaluation  O2 sats: stable throughout Complications: No apparent complications Patient did tolerate procedure well. Bilateral Breath Sounds: Diminished,Clear   Yes   RT extubated patient to 4L [place] per MD order with RN at bedside. Positive cuff leak noted. Patient tolerated well and currently sating 100%. No complications or stridor noted at this time. RT will continue to monitor as needed.   Fabiola Backer 05/26/2020, 10:39 AM

## 2020-05-26 NOTE — Progress Notes (Signed)
Wasted 46mL of fentanyl (from med bag) with Candiss Norse RN Wasted 145mL of ketamine (from med bag) with Candiss Norse RN

## 2020-05-26 NOTE — Progress Notes (Signed)
NAME:  Lonnie Hansen, MRN:  [id number], DOB:  [birth year], LOS: 2 ADMISSION DATE:  05/24/2020, CONSULTATION DATE: 4/29 REFERRING MD: Dr. Gilford Raid EDP, CHIEF COMPLAINT: Gastrointestinal hemorrhage  History of Present Illness:  70 year old male with past medical history as below, which is significant for thoracic and abdominal aortic aneurysm, obstructive sleep apnea, ITP, coronary artery disease, and hypertension.  He presented [name] emergency department in the evening hours of 4/29 with complaints of bright red blood per rectum starting earlier that day with associated abdominal cramping.  He had an admission to [facility] in March of last year with similar complaints and was found to have multiple diverticula on colonoscopy, however, no obvious bleeding at the time of examination.  He also notes a history of ulcerative colitis, although, I do not this in his medical record.   Upon arrival to the emergency department he had 2 near syncopal episodes in the waiting room and was brought emergently back to a room.  He reports no alcohol use but does take approximately 500 mg of aspirin daily for arthritic hip pain.  He would have ongoing episodes of abdominal cramping followed by bright red blood per rectum.  He would occasionally have subsequent drops in blood pressure and heart rate to the 60Y systolic and 30Z respectively.  He was given 4 L of crystalloids and transfused 2 units of PRBC in the emergency department prompting PCCM consultation.  Pertinent  Medical History   has a past medical history of AAA (abdominal aortic aneurysm) ([place]), Anemia, Arthritis, Asymptomatic bilateral carotid artery stenosis (08/2015), Blood dyscrasia, Bruising, Cataracts, bilateral, Chronic ITP (idiopathic thrombocytopenia) ([place]) (03/31/2018), Coronary artery disease, Diverticulosis, Enlarged aorta ([place]), Enlarged prostate, GERD (gastroesophageal reflux disease), Glaucoma, Headache, History of colon polyps,  History of kidney stones, Hyperlipidemia, Hypertension, Joint pain, Joint swelling, Nocturia, OSA on CPAP, Sleep apnea, and Vocal cord nodule.   Significant Hospital Events: Including procedures, antibiotic start and stop dates in addition to other pertinent events   . 4/29 admit to ICU for GI bleeding . 4/29 right IJ cordis/TLC >>> . 4/29 colonoscopy . 5/1 extubated   Interim History / Subjective:   Remains critically ill. Intubated on life support. Tolerating SBT SAT   Objective   Blood pressure 133/83, pulse (!) 108, temperature 99.86 F (37.7 C), resp. rate 18, height 5\' 9"  (1.753 m), weight 102.6 kg, SpO2 98 %.    Vent Mode: CPAP;PSV FiO2 (%):  [30 %-40 %] 30 % Set Rate:  [16 bmp] 16 bmp Vt Set:  [560 mL] 560 mL PEEP:  [5 cmH20] 5 cmH20 Pressure Support:  [10 cmH20] 10 cmH20 Plateau Pressure:  [14 cmH20-15 cmH20] 15 cmH20   Intake/Output Summary (Last 24 hours) at 05/26/2020 1039 Last data filed at 05/26/2020 0800 Gross per 24 hour  Intake 4262.9 ml  Output 2930 ml  Net 1332.9 ml   Filed Weights   05/24/20 1932 05/25/20 0351 05/26/20 0500  Weight: 97.5 kg 100.6 kg 102.6 kg    Examination: General: elderly male, intubated on life support  HENT: tracking appropriately, following commands  Lungs: BL vented breaths  Cardiovascular: RRR, s1 s2  Abdomen: soft, nt nd  Extremities: moves all 4 extremities  Neuro: alert following commands, no deficit   Labs/imaging that I have personally reviewed  (right click and "Reselect all SmartList Selections" daily)   WBC 13.2 Hgb 9.3 (stable)  PLT 51, trending down   Resolved Hospital Problem list     Assessment & Plan:  Hemorrhagic shock: secondary to acute lower GI bleed.  P: Conservative transfusion threshold H&H remains stable  Cbc q day now If rebleeds or hemodynamics change we will recheck sooner  If rebleeds we will need to call IR   PEA cardiac arrest 1 round of epinephrine, likely vagal event following  significant bloody bowel movement. Plan: Supportive care  AHRF on MV secondary to above P: SAT SBT this am Consider extubation today   AKI -Continue to support blood pressure P: Follow UOP and kidney function   OSA on CPAP -he will need OP eval   CAD HTN Thoracic and AAA P: Slowly will need restart of home meds   ITP: platelets 97K on admission Leukocytosis: likely reactive - holding transfusion of PLT at this time    Best practice (right click and "Reselect all SmartList Selections" daily)  Diet:  NPO Pain/Anxiety/Delirium protocol (if indicated): No VAP protocol (if indicated): Not indicated DVT prophylaxis: SCD and Contraindicated GI prophylaxis: PPI  Glucose control:  SSI No Central venous access:  Yes, and it is still needed Arterial line:  N/A Foley:  N/A Mobility:  bed rest  PT consulted: N/A Last date of multidisciplinary goals of care discussion [ ]  Code Status:  full code Disposition: ICU  Labs   CBC: Recent Labs  Lab 05/24/20 2007 05/24/20 2242 05/25/20 0217 05/25/20 0332 05/25/20 1156 05/25/20 1157 05/25/20 1742 05/25/20 2345 05/26/20 0439  WBC 14.8* 12.8*  --   --  13.0*  --  11.6* 12.0* 13.2*  NEUTROABS 11.9*  --   --   --   --   --   --   --   --   HGB 12.1* 10.1*   < > 12.0* 10.6* 10.2* 9.9* 9.7* 9.3*  HCT 37.8* 31.2*   < > 34.2* 30.7* 30.0* 29.5* 28.5* 28.0*  MCV 87.5 89.9  --   --  87.5  --  88.9 88.5 89.7  PLT 97* 67*  --  71* 62*  --  62* 57* 51*   < > = values in this interval not displayed.    Basic Metabolic Panel: Recent Labs  Lab 05/24/20 2007 05/25/20 0217 05/25/20 0332 05/25/20 1157 05/25/20 2111  NA 137 143 140 143 140  K 3.6 3.5 3.5 4.1 3.6  CL 105  --  108 109 114*  CO2 22  --  21*  --  22  GLUCOSE 149*  --  208* 133* 131*  BUN 18  --  16 19 18   CREATININE 1.54*  --  1.28* 1.20 1.29*  CALCIUM 9.1  --  7.9*  --  7.4*  MG  --   --  1.5*  --  1.8  PHOS  --   --  3.1  --   --    GFR: Estimated Creatinine  Clearance: 62.9 mL/min (A) (by C-G formula based on SCr of 1.29 mg/dL (H)). Recent Labs  Lab 05/25/20 0332 05/25/20 1156 05/25/20 1742 05/25/20 2345 05/26/20 0439  PROCALCITON <0.10  --   --   --   --   WBC  --  13.0* 11.6* 12.0* 13.2*  LATICACIDVEN 2.3*  --   --   --   --     Liver Function Tests: Recent Labs  Lab 05/24/20 2007 05/25/20 0332  AST 22 26  ALT 13 18  ALKPHOS 39 32*  BILITOT 0.6 1.2  PROT 6.8 5.2*  ALBUMIN 4.0 3.0*   No results for input(s): LIPASE, AMYLASE in the last  168 hours. No results for input(s): AMMONIA in the last 168 hours.  ABG    Component Value Date/Time   PHART 7.283 (L) 05/25/2020 0217   PCO2ART 41.8 05/25/2020 0217   PO2ART 399 (H) 05/25/2020 0217   HCO3 19.9 (L) 05/25/2020 0217   TCO2 21 (L) 05/25/2020 1157   ACIDBASEDEF 7.0 (H) 05/25/2020 0217   O2SAT 100.0 05/25/2020 0217     Coagulation Profile: Recent Labs  Lab 05/24/20 2007 05/25/20 0332  INR 1.1 1.2    Cardiac Enzymes: No results for input(s): CKTOTAL, CKMB, CKMBINDEX, TROPONINI in the last 168 hours.  HbA1C: Hgb A1c MFr Bld  Date/Time Value Ref Range Status  09/11/2016 10:48 AM 5.3 4.8 - 5.6 % Final    Comment:    (NOTE) Pre diabetes:          5.7%-6.4% Diabetes:              >6.4% Glycemic control for   <7.0% adults with diabetes   04/28/2010 07:03 AM  <5.7 % Final   5.6 (NOTE)                                                                       According to the ADA Clinical Practice Recommendations for 2011, when HbA1c is used as a screening test:   >=6.5%   Diagnostic of Diabetes Mellitus           (if abnormal result  is confirmed)  5.7-6.4%   Increased risk of developing Diabetes Mellitus  References:Diagnosis and Classification of Diabetes Mellitus,Diabetes [id number],2878,67(EHMCN 1):S62-S69 and Standards of Medical Care in         Diabetes - 2011,Diabetes OBSJ,6283,66  (Suppl 1):S11-S61.    CBG: Recent Labs  Lab 05/26/20 0543  GLUCAP 106*    This  patient is critically ill with multiple organ system failure; which, requires frequent high complexity decision making, assessment, support, evaluation, and titration of therapies. This was completed through the application of advanced monitoring technologies and extensive interpretation of multiple databases. During this encounter critical care time was devoted to patient care services described in this note for 33 minutes.  Garner Nash, DO [facility] 05/26/2020 10:39 AM

## 2020-05-27 ENCOUNTER — Encounter (HOSPITAL_COMMUNITY): Payer: Self-pay | Admitting: Gastroenterology

## 2020-05-27 DIAGNOSIS — K922 Gastrointestinal hemorrhage, unspecified: Secondary | ICD-10-CM | POA: Diagnosis not present

## 2020-05-27 LAB — CBC
HCT: 25.5 % — ABNORMAL LOW (ref 39.0–52.0)
Hemoglobin: 8.3 g/dL — ABNORMAL LOW (ref 13.0–17.0)
MCH: 29.9 pg (ref 26.0–34.0)
MCHC: 32.5 g/dL (ref 30.0–36.0)
MCV: 91.7 fL (ref 80.0–100.0)
Platelets: 50 10*3/uL — ABNORMAL LOW (ref 150–400)
RBC: 2.78 MIL/uL — ABNORMAL LOW (ref 4.22–5.81)
RDW: 17.2 % — ABNORMAL HIGH (ref 11.5–15.5)
WBC: 8.9 10*3/uL (ref 4.0–10.5)
nRBC: 0 % (ref 0.0–0.2)

## 2020-05-27 LAB — BASIC METABOLIC PANEL
Anion gap: 8 (ref 5–15)
BUN: 10 mg/dL (ref 8–23)
CO2: 24 mmol/L (ref 22–32)
Calcium: 7.6 mg/dL — ABNORMAL LOW (ref 8.9–10.3)
Chloride: 107 mmol/L (ref 98–111)
Creatinine, Ser: 0.99 mg/dL (ref 0.61–1.24)
GFR, Estimated: >60 mL/min (ref >60)
Glucose, Bld: 97 mg/dL (ref 70–99)
Potassium: 3.5 mmol/L (ref 3.5–5.1)
Sodium: 139 mmol/L (ref 135–145)

## 2020-05-27 MED ORDER — MONTELUKAST SODIUM 10 MG PO TABS
10.0000 mg | ORAL_TABLET | Freq: Every day | ORAL | Status: DC
  Administered 2020-05-28 – 2020-05-29 (×2): 10 mg via ORAL
  Filled 2020-05-27 (×2): qty 1

## 2020-05-27 MED ORDER — ROSUVASTATIN CALCIUM 5 MG PO TABS
10.0000 mg | ORAL_TABLET | Freq: Every day | ORAL | Status: DC
  Administered 2020-05-28 – 2020-05-29 (×2): 10 mg via ORAL
  Filled 2020-05-27 (×2): qty 2

## 2020-05-27 MED ORDER — CHLORHEXIDINE GLUCONATE CLOTH 2 % EX PADS
6.0000 | MEDICATED_PAD | Freq: Every day | CUTANEOUS | Status: DC
  Administered 2020-05-28 – 2020-05-29 (×2): 6 via TOPICAL

## 2020-05-27 MED ORDER — ENSURE ENLIVE PO LIQD
237.0000 mL | Freq: Every day | ORAL | Status: DC
  Administered 2020-05-28: 237 mL via ORAL

## 2020-05-27 MED ORDER — POTASSIUM CHLORIDE CRYS ER 20 MEQ PO TBCR
40.0000 meq | EXTENDED_RELEASE_TABLET | Freq: Once | ORAL | Status: AC
  Administered 2020-05-27: 40 meq via ORAL
  Filled 2020-05-27: qty 2

## 2020-05-27 MED ORDER — FLUTICASONE PROPIONATE 50 MCG/ACT NA SUSP: 1.0000 | Freq: Every day | NASAL | Status: DC | PRN

## 2020-05-27 MED ORDER — PANTOPRAZOLE SODIUM 40 MG PO TBEC
40.0000 mg | DELAYED_RELEASE_TABLET | Freq: Every day | ORAL | Status: DC
  Administered 2020-05-28 – 2020-05-29 (×2): 40 mg via ORAL
  Filled 2020-05-27 (×2): qty 1

## 2020-05-27 MED ORDER — ADULT MULTIVITAMIN W/MINERALS CH
1.0000 | ORAL_TABLET | Freq: Every day | ORAL | Status: DC
  Administered 2020-05-28 – 2020-05-29 (×2): 1 via ORAL
  Filled 2020-05-27 (×2): qty 1

## 2020-05-27 MED ORDER — AMLODIPINE BESYLATE 10 MG PO TABS
10.0000 mg | ORAL_TABLET | Freq: Every day | ORAL | Status: DC
  Administered 2020-05-28 – 2020-05-29 (×2): 10 mg via ORAL
  Filled 2020-05-27 (×2): qty 1

## 2020-05-27 NOTE — Plan of Care (Signed)

## 2020-05-27 NOTE — Progress Notes (Signed)
[facility] ADULT ICU REPLACEMENT PROTOCOL   The patient does apply for the [facility] Adult ICU Electrolyte Replacment Protocol based on the criteria listed below:   1. Is GFR >/= 30 ml/min? Yes.    Patient's GFR today is >60 2. Is SCr </= 2? Yes.   Patient's SCr is 0.99 ml/kg/hr 3. Did SCr increase >/= 0.5 in 24 hours? No. 4. Abnormal electrolyte(s): K+3.55. Ordered repletion with: protocol 6. If a panic level lab has been reported, has the CCM MD in charge been notified? No..   Physician:  Carol Ada, [place] Grumbling 05/27/2020 6:01 AM

## 2020-05-27 NOTE — Progress Notes (Signed)
Asked pt if he wears CPAP at home to sleep, he said yes. Asked if he wanted a CPAP to wear tonight; he does not want one at this time, says his nose is stuffed up.  Will continue to monitor.

## 2020-05-27 NOTE — Progress Notes (Signed)
NAME:  Lonnie Hansen, MRN:  [id number], DOB:  [birth year], LOS: 3 ADMISSION DATE:  05/24/2020, CONSULTATION DATE: 4/29 REFERRING MD: Dr. Gilford Raid EDP, CHIEF COMPLAINT: Gastrointestinal hemorrhage  History of Present Illness:  70 year old male with past medical history as below, which is significant for thoracic and abdominal aortic aneurysm, obstructive sleep apnea, ITP, coronary artery disease, and hypertension.  He presented [name] emergency department in the evening hours of 4/29 with complaints of bright red blood per rectum starting earlier that day with associated abdominal cramping.  He had an admission to [facility] in March of last year with similar complaints and was found to have multiple diverticula on colonoscopy, however, no obvious bleeding at the time of examination.  He also notes a history of ulcerative colitis, although, I do not this in his medical record.   Upon arrival to the emergency department he had 2 near syncopal episodes in the waiting room and was brought emergently back to a room.  He reports no alcohol use but does take approximately 500 mg of aspirin daily for arthritic hip pain.  He would have ongoing episodes of abdominal cramping followed by bright red blood per rectum.  He would occasionally have subsequent drops in blood pressure and heart rate to the 40X systolic and 73Z respectively.  He was given 4 L of crystalloids and transfused 2 units of PRBC in the emergency department prompting PCCM consultation.  Pertinent  Medical History   has a past medical history of AAA (abdominal aortic aneurysm) ([place]), Anemia, Arthritis, Asymptomatic bilateral carotid artery stenosis (08/2015), Blood dyscrasia, Bruising, Cataracts, bilateral, Chronic ITP (idiopathic thrombocytopenia) ([place]) (03/31/2018), Coronary artery disease, Diverticulosis, Enlarged aorta ([place]), Enlarged prostate, GERD (gastroesophageal reflux disease), Glaucoma, Headache, History of colon polyps,  History of kidney stones, Hyperlipidemia, Hypertension, Joint pain, Joint swelling, Nocturia, OSA on CPAP, Sleep apnea, and Vocal cord nodule.   Significant Hospital Events: Including procedures, antibiotic start and stop dates in addition to other pertinent events   . 4/29 admit to ICU for GI bleeding . 4/29 right IJ cordis/TLC >>> . 4/29 colonoscopy . 5/1 extubated   Interim History / Subjective:   Extubated yesterday. Denies dyspnea.  No abdominal pain or vomiting.  Passed 2 dark bowel movements.  Objective   Blood pressure 124/85, pulse 86, temperature 99.9 F (37.7 C), temperature source Oral, resp. rate 19, height 5\' 9"  (1.753 m), weight 102.5 kg, SpO2 96 %.        Intake/Output Summary (Last 24 hours) at 05/27/2020 1309 Last data filed at 05/27/2020 1030 Gross per 24 hour  Intake 1231.84 ml  Output 3741 ml  Net -2509.16 ml   Filed Weights   05/25/20 0351 05/26/20 0500 05/27/20 0500  Weight: 100.6 kg 102.6 kg 102.5 kg    Examination: General: elderly male, extubated in no distress HENT: Mucous membranes are moist. Lungs: Chest clear laterally. Cardiovascular: RRR, s1 s2, no JVD.  Extremities warm and well-perfused Abdomen: Somewhat distended but soft.  No tenderness. GU: No Foley catheter in place Neuro: alert following commands, no deficit.  Labs/imaging that I have personally reviewed  (right click and "Reselect all SmartList Selections" daily)   Creatinine has normalized. Hemoglobin now 8.3.  Resolved Hospital Problem list   Acute respiratory failure Acute kidney injury   Assessment & Plan:   Hemorrhagic shock: secondary to acute lower GI bleed likely due to diverticular bleed PEA cardiac arrest likely vagal event following significant bloody bowel movement. OSA on CPAP CAD HTN Thoracic  and AAA ITP: platelets 97K on admission Leukocytosis  Plan:  -No evidence of ongoing bleeding.  Likely passing residual blood as initial hemorrhage was bright  red. -Ready for transfer to [place].  Progressive ambulation, PT consultation. -Patient advised that he had a suspected diverticular bleed and that recurrences can be unpredictable and there is no easy solution to prevent recurrence. -Restart home medications.  -Ready for transfer to floor. Orders reconciled and TRH notified.    Best practice (right click and "Reselect all SmartList Selections" daily)  Diet:  Oral-advance diet Pain/Anxiety/Delirium protocol (if indicated): No VAP protocol (if indicated): Not indicated DVT prophylaxis: SCD progressive ambulation GI prophylaxis: PPI  Glucose control:  SSI No Central venous access:  Yes, and it is no longer needed Arterial line:  N/A Foley:  Yes, and it is no longer needed Mobility:  bed rest  PT consulted: Yes Last date of multidisciplinary goals of care discussion [patient and wife updated at bedside.] Code Status:  full code Disposition: ICU  Labs   CBC: Recent Labs  Lab 05/24/20 2007 05/24/20 2242 05/25/20 1156 05/25/20 1157 05/25/20 1742 05/25/20 2345 05/26/20 0439 05/27/20 0500  WBC 14.8*   < > 13.0*  --  11.6* 12.0* 13.2* 8.9  NEUTROABS 11.9*  --   --   --   --   --   --   --   HGB 12.1*   < > 10.6* 10.2* 9.9* 9.7* 9.3* 8.3*  HCT 37.8*   < > 30.7* 30.0* 29.5* 28.5* 28.0* 25.5*  MCV 87.5   < > 87.5  --  88.9 88.5 89.7 91.7  PLT 97*   < > 62*  --  62* 57* 51* 50*   < > = values in this interval not displayed.    Basic Metabolic Panel: Recent Labs  Lab 05/24/20 2007 05/25/20 0217 05/25/20 0332 05/25/20 1157 05/25/20 2111 05/26/20 2200 05/27/20 0500  NA 137   < > 140 143 140 140 139  K 3.6   < > 3.5 4.1 3.6 3.4* 3.5  CL 105  --  108 109 114* 108 107  CO2 22  --  21*  --  22 24 24   GLUCOSE 149*  --  208* 133* 131* 106* 97  BUN 18  --  16 19 18 12 10   CREATININE 1.54*  --  1.28* 1.20 1.29* 1.05 0.99  CALCIUM 9.1  --  7.9*  --  7.4* 7.6* 7.6*  MG  --   --  1.5*  --  1.8 2.0  --   PHOS  --   --  3.1  --   --    --   --    < > = values in this interval not displayed.   GFR: Estimated Creatinine Clearance: 81.9 mL/min (by C-G formula based on SCr of 0.99 mg/dL). Recent Labs  Lab 05/25/20 0332 05/25/20 1156 05/25/20 1742 05/25/20 2345 05/26/20 0439 05/27/20 0500  PROCALCITON <0.10  --   --   --   --   --   WBC  --    < > 11.6* 12.0* 13.2* 8.9  LATICACIDVEN 2.3*  --   --   --   --   --    < > = values in this interval not displayed.    Liver Function Tests: Recent Labs  Lab 05/24/20 2007 05/25/20 0332  AST 22 26  ALT 13 18  ALKPHOS 39 32*  BILITOT 0.6 1.2  PROT 6.8 5.2*  ALBUMIN  4.0 3.0*   No results for input(s): LIPASE, AMYLASE in the last 168 hours. No results for input(s): AMMONIA in the last 168 hours.  ABG    Component Value Date/Time   PHART 7.283 (L) 05/25/2020 0217   PCO2ART 41.8 05/25/2020 0217   PO2ART 399 (H) 05/25/2020 0217   HCO3 19.9 (L) 05/25/2020 0217   TCO2 21 (L) 05/25/2020 1157   ACIDBASEDEF 7.0 (H) 05/25/2020 0217   O2SAT 100.0 05/25/2020 0217     Coagulation Profile: Recent Labs  Lab 05/24/20 2007 05/25/20 0332  INR 1.1 1.2    Cardiac Enzymes: No results for input(s): CKTOTAL, CKMB, CKMBINDEX, TROPONINI in the last 168 hours.  HbA1C: Hgb A1c MFr Bld  Date/Time Value Ref Range Status  09/11/2016 10:48 AM 5.3 4.8 - 5.6 % Final    Comment:    (NOTE) Pre diabetes:          5.7%-6.4% Diabetes:              >6.4% Glycemic control for   <7.0% adults with diabetes   04/28/2010 07:03 AM  <5.7 % Final   5.6 (NOTE)                                                                       According to the ADA Clinical Practice Recommendations for 2011, when HbA1c is used as a screening test:   >=6.5%   Diagnostic of Diabetes Mellitus           (if abnormal result  is confirmed)  5.7-6.4%   Increased risk of developing Diabetes Mellitus  References:Diagnosis and Classification of Diabetes Mellitus,Diabetes OLMB,8675,44(BEEFE 1):S62-S69 and Standards  of Medical Care in         Diabetes - 2011,Diabetes OFHQ,1975,88  (Suppl 1):S11-S61.    CBG: Recent Labs  Lab 05/26/20 0543  GLUCAP 106*    35 minutes spent with greater than 50% of time in counseling and coordination of care.  Kipp Brood, MD [place] [facility] 05/27/2020 1:09 PM

## 2020-05-28 DIAGNOSIS — R578 Other shock: Secondary | ICD-10-CM | POA: Diagnosis not present

## 2020-05-28 LAB — TYPE AND SCREEN
ABO/RH(D): AB POS
Antibody Screen: NEGATIVE
Unit division: 0
Unit division: 0
Unit division: 0
Unit division: 0
Unit division: 0
Unit division: 0
Unit division: 0
Unit division: 0
Unit division: 0
Unit division: 0
Unit division: 0
Unit division: 0

## 2020-05-28 LAB — BPAM RBC
Blood Product Expiration Date: 202205162359
Blood Product Expiration Date: 202205192359
Blood Product Expiration Date: 202205202359
Blood Product Expiration Date: 202205202359
Blood Product Expiration Date: 202205202359
Blood Product Expiration Date: 202205232359
Blood Product Expiration Date: 202205242359
Blood Product Expiration Date: 202205242359
Blood Product Expiration Date: 202205242359
Blood Product Expiration Date: 202205242359
Blood Product Expiration Date: 202205242359
Blood Product Expiration Date: 202205312359
ISSUE DATE / TIME: 202204261728
ISSUE DATE / TIME: 202204292003
ISSUE DATE / TIME: 202204292142
ISSUE DATE / TIME: 202204292306
ISSUE DATE / TIME: 202204292306
ISSUE DATE / TIME: 202204300027
ISSUE DATE / TIME: 202204300027
ISSUE DATE / TIME: 202204300027
ISSUE DATE / TIME: 202204300027
Unit Type and Rh: 5100
Unit Type and Rh: 6200
Unit Type and Rh: 6200
Unit Type and Rh: 6200
Unit Type and Rh: 6200
Unit Type and Rh: 6200
Unit Type and Rh: 6200
Unit Type and Rh: 6200
Unit Type and Rh: 6200
Unit Type and Rh: 6200
Unit Type and Rh: 6200
Unit Type and Rh: 8400

## 2020-05-28 LAB — CBC
HCT: 30.1 % — ABNORMAL LOW (ref 39.0–52.0)
Hemoglobin: 10.1 g/dL — ABNORMAL LOW (ref 13.0–17.0)
MCH: 30 pg (ref 26.0–34.0)
MCHC: 33.6 g/dL (ref 30.0–36.0)
MCV: 89.3 fL (ref 80.0–100.0)
Platelets: 62 10*3/uL — ABNORMAL LOW (ref 150–400)
RBC: 3.37 MIL/uL — ABNORMAL LOW (ref 4.22–5.81)
RDW: 16.8 % — ABNORMAL HIGH (ref 11.5–15.5)
WBC: 9.1 10*3/uL (ref 4.0–10.5)
nRBC: 0.2 % (ref 0.0–0.2)

## 2020-05-28 LAB — BASIC METABOLIC PANEL
Anion gap: 8 (ref 5–15)
Anion gap: 8 (ref 5–15)
BUN: 12 mg/dL (ref 8–23)
BUN: 8 mg/dL (ref 8–23)
CO2: 25 mmol/L (ref 22–32)
CO2: 28 mmol/L (ref 22–32)
Calcium: 8.3 mg/dL — ABNORMAL LOW (ref 8.9–10.3)
Calcium: 8.4 mg/dL — ABNORMAL LOW (ref 8.9–10.3)
Chloride: 105 mmol/L (ref 98–111)
Chloride: 104 mmol/L (ref 98–111)
Creatinine, Ser: 0.99 mg/dL (ref 0.61–1.24)
Creatinine, Ser: 1.06 mg/dL (ref 0.61–1.24)
GFR, Estimated: >60 mL/min (ref >60)
GFR, Estimated: >60 mL/min (ref >60)
Glucose, Bld: 115 mg/dL — ABNORMAL HIGH (ref 70–99)
Glucose, Bld: 112 mg/dL — ABNORMAL HIGH (ref 70–99)
Potassium: 3.5 mmol/L (ref 3.5–5.1)
Potassium: 3.6 mmol/L (ref 3.5–5.1)
Sodium: 138 mmol/L (ref 135–145)
Sodium: 140 mmol/L (ref 135–145)

## 2020-05-28 MED ORDER — POTASSIUM CHLORIDE CRYS ER 20 MEQ PO TBCR
40.0000 meq | EXTENDED_RELEASE_TABLET | Freq: Once | ORAL | Status: AC
  Administered 2020-05-28: 40 meq via ORAL
  Filled 2020-05-28: qty 2

## 2020-05-28 NOTE — Progress Notes (Signed)
NAME:  Lonnie Hansen, MRN:  [id number], DOB:  Feb 06, 1950, LOS: 4 ADMISSION DATE:  05/24/2020, CONSULTATION DATE: 4/29 REFERRING MD: Dr. Gilford Raid EDP, CHIEF COMPLAINT: Gastrointestinal hemorrhage  History of Present Illness:  70 year old male with past medical history as below, which is significant for thoracic and abdominal aortic aneurysm, obstructive sleep apnea, ITP, coronary artery disease, and hypertension.  He presented [name] emergency department in the evening hours of 4/29 with complaints of bright red blood per rectum starting earlier that day with associated abdominal cramping.  He had an admission to [facility] in March of last year with similar complaints and was found to have multiple diverticula on colonoscopy, however, no obvious bleeding at the time of examination.  He also notes a history of ulcerative colitis, although, I do not this in his medical record.   Upon arrival to the emergency department he had 2 near syncopal episodes in the waiting room and was brought emergently back to a room.  He reports no alcohol use but does take approximately 500 mg of aspirin daily for arthritic hip pain.  He would have ongoing episodes of abdominal cramping followed by bright red blood per rectum.  He would occasionally have subsequent drops in blood pressure and heart rate to the 35K systolic and 56Y respectively.  He was given 4 L of crystalloids and transfused 2 units of PRBC in the emergency department prompting PCCM consultation.  Pertinent  Medical History   has a past medical history of AAA (abdominal aortic aneurysm) ([place]), Anemia, Arthritis, Asymptomatic bilateral carotid artery stenosis (08/2015), Blood dyscrasia, Bruising, Cataracts, bilateral, Chronic ITP (idiopathic thrombocytopenia) ([place]) (03/31/2018), Coronary artery disease, Diverticulosis, Enlarged aorta ([place]), Enlarged prostate, GERD (gastroesophageal reflux disease), Glaucoma, Headache, History of colon polyps,  History of kidney stones, Hyperlipidemia, Hypertension, Joint pain, Joint swelling, Nocturia, OSA on CPAP, Sleep apnea, and Vocal cord nodule.   Significant Hospital Events: Including procedures, antibiotic start and stop dates in addition to other pertinent events   . 4/29 admit to ICU for GI bleeding . 4/29 right IJ cordis/TLC >>> . 4/29 colonoscopy . 5/1 extubated  . 5/2 tolerating diet  Interim History / Subjective:   No further evidence of active bleed. Patient with no complaints.   Objective   Blood pressure (!) 142/83, pulse 83, temperature 98 F (36.7 C), temperature source Oral, resp. rate (!) 21, height 5\' 9"  (1.753 m), weight 102.5 kg, SpO2 97 %.        Intake/Output Summary (Last 24 hours) at 05/28/2020 0910 Last data filed at 05/28/2020 0800 Gross per 24 hour  Intake 280 ml  Output 4031 ml  Net -3751 ml   Filed Weights   05/25/20 0351 05/26/20 0500 05/27/20 0500  Weight: 100.6 kg 102.6 kg 102.5 kg    Examination: General:  Elderly appearing male in NAD Neuro:  Alert, oriented, non-focal HEENT:  [place]/AT, No JVD noted, PERRL Cardiovascular:  RRR, no MRG Lungs:  Clear, bilateral breath sounds.  Abdomen:  Soft, non-distended, non-tender Musculoskeletal:  No acute deformity or ROM limitation Skin:  Intact, MMM  Labs/imaging that I have personally reviewed  (right click and "Reselect all SmartList Selections" daily)   Hgb 10.1 up-trending  Resolved Hospital Problem list   Acute respiratory failure Acute kidney injury PEA cardiac arrest likely vagal event following significant bloody bowel movement. Leukocytosis  Assessment & Plan:   Hemorrhagic shock: secondary to acute lower GI bleed likely due to diverticular bleed - Follow CBC daily - PPI -  Observe for evidence of ongoing bleeding. Would need to re-involve IR if so.  - Ready for transfer to [place].  Progressive ambulation, PT consultation.  OSA on CPAP - QHS CPAP  CAD HTN Thoracic and AAA -  continue home amlodipine, rosuvastatin   ITP: platelets 97K on admission, as low as 50 but now uptrending - follow CBC - Outpatient heme followup      Best practice (right click and "Reselect all SmartList Selections" daily)  Diet:  Oral-advance diet Pain/Anxiety/Delirium protocol (if indicated): No VAP protocol (if indicated): Not indicated DVT prophylaxis: SCD progressive ambulation GI prophylaxis: PPI  Glucose control:  SSI No Central venous access:  N/A Arterial line:  N/A Foley:  N/A Mobility:  bed rest  PT consulted: Yes Last date of multidisciplinary goals of care discussion [patient updated]  Code Status:  full code Disposition: ICU     Georgann Housekeeper, AGACNP-BC [place] for personal pager PCCM on call pager [phone] until 7pm. Please call Elink 7p-7a. [phone]  05/28/2020 9:16 AM

## 2020-05-28 NOTE — Progress Notes (Signed)
[facility] ADULT ICU REPLACEMENT PROTOCOL   The patient does apply for the [facility] Adult ICU Electrolyte Replacment Protocol based on the criteria listed below:   1. Is GFR >/= 30 ml/min? Yes.    Patient's GFR today is >60 2. Is SCr </= 2? Yes.   Patient's SCr is 0.99 ml/kg/hr 3. Did SCr increase >/= 0.5 in 24 hours? No. 4. Abnormal electrolyte(s): k 3.5 5. Ordered repletion with: protocol per mouth 6. If a panic level lab has been reported, has the CCM MD in charge been notified? No..   Physician:    Ronda Fairly A 05/28/2020 1:58 AM

## 2020-05-28 NOTE — Progress Notes (Signed)
[place] notified for potassium replacement. K 3.5, Crt 0.99, UOP WDL. Awaiting orders.

## 2020-05-28 NOTE — Evaluation (Signed)
Occupational Therapy Evaluation Patient Details Name: Lonnie Hansen MRN: [id number] DOB: October 30, 1950 Today's Date: 05/28/2020    History of Present Illness 70 yo admitted 4/29 with GIB with near syncope. 4/30 PEA arrest after BM. Extubated 5/1. PMhx: AAA, thoracic aortic aneurysm, CAD, OSA, HTN, GERD, diverticulosis, HLD   Clinical Impression   Pt PTA: Pt reports independence with ADL and mobility with SPC for R hip pain. Pt currently, limited by R hip pain and R side pain. Pt using figure 4 technique for LB ADL. Pt standing  at sink and mobility with hand held assist due to pt needing a SPC (but apparently unable to find since admission here.). Pt appears at functional baseline. VSS. Pt education on energy conservation techniques provided. Pt does not require continued OT skilled services. OT signing off. Thank you.     Follow Up Recommendations  No OT follow up    Equipment Recommendations  None recommended by OT    Recommendations for Other Services       Precautions / Restrictions Precautions Precautions: Fall Precaution Comments: bad right hip Restrictions Weight Bearing Restrictions: No      Mobility Bed Mobility Overal bed mobility: Modified Independent                  Transfers Overall transfer level: Modified independent Equipment used: 1 person hand held assist             General transfer comment: pt's R hip hurting the first few steps and then it warmed up to natural walking    Balance Overall balance assessment: Mild deficits observed, not formally tested                                         ADL either performed or assessed with clinical judgement   ADL Overall ADL's : At baseline                                       General ADL Comments: "the hardest part of my occupation will be putting onmy tuxedo."Pt using figure 4 technique for LB ADL. Pt standing  at sink and mobility with hand held assist due to  pt needing a SPC (but apparently was lost in ED)     Vision Baseline Vision/History: Wears glasses Wears Glasses: Reading only Patient Visual Report: No change from baseline Vision Assessment?: No apparent visual deficits     Perception     Praxis      Pertinent Vitals/Pain Pain Assessment: No/denies pain     Hand Dominance Right   Extremity/Trunk Assessment Upper Extremity Assessment Upper Extremity Assessment: Overall WFL for tasks assessed   Lower Extremity Assessment Lower Extremity Assessment: RLE deficits/detail RLE Deficits / Details: baseline Rt hip dysfunction   Cervical / Trunk Assessment Cervical / Trunk Assessment: Normal   Communication Communication Communication: No difficulties   Cognition Arousal/Alertness: Awake/alert Behavior During Therapy: WFL for tasks assessed/performed Overall Cognitive Status: Within Functional Limits for tasks assessed                                     General Comments  VSS on RA. Education for energy conservation performed.    Exercises     Shoulder  Instructions      Home Living Family/patient expects to be discharged to:: Private residence Living Arrangements: Spouse/significant other Available Help at Discharge: Family;Available 24 hours/day Type of Home: House Home Access: Stairs to enter [employer] of Steps: 3 Entrance Stairs-Rails: Right Home Layout: Two level;Able to live on main level with bedroom/bathroom     Bathroom Shower/Tub: Occupational psychologist: Standard     Home Equipment: Environmental consultant - 2 wheels;Bedside commode;Cane - single point          Prior Functioning/Environment Level of Independence: Independent with assistive device(s)        Comments: 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        OT Problem List: Decreased activity tolerance      OT Treatment/Interventions:      OT Goals(Current goals can be found in the care plan section) Acute Rehab OT  Goals Patient Stated Goal: to go home OT Goal Formulation: All assessment and education complete, DC therapy Potential to Achieve Goals: Good  OT Frequency:     Barriers to D/C:            Co-evaluation              AM-PAC OT "6 Clicks" Daily Activity     Outcome Measure Help from another person eating meals?: None Help from another person taking care of personal grooming?: None Help from another person toileting, which includes using toliet, bedpan, or urinal?: None Help from another person bathing (including washing, rinsing, drying)?: A Little Help from another person to put on and taking off regular upper body clothing?: None Help from another person to put on and taking off regular lower body clothing?: None 6 Click Score: 23   End of Session Nurse Communication: Mobility status  Activity Tolerance: Patient tolerated treatment well Patient left: in bed;with call bell/phone within reach  OT Visit Diagnosis: Unsteadiness on feet (R26.81)                Time: 5284-1324 OT Time Calculation (min): 26 min Charges:  OT General Charges $OT Visit: 1 Visit OT Evaluation $OT Eval Moderate Complexity: 1 Mod OT Treatments $Self Care/Home Management : 8-22 mins  Jefferey Pica, OTR/L Acute Rehabilitation Services Pager: [phone] Office: [place] 05/28/2020, 5:50 PM

## 2020-05-28 NOTE — Progress Notes (Signed)
Initial Nutrition Assessment  DOCUMENTATION CODES:   Not applicable  INTERVENTION:   Change diet to GI SOFT   Ensure Enlive po once daily, each supplement provides 350 kcal and 20 grams of protein  MVI daily  NUTRITION DIAGNOSIS:   Increased nutrient needs related to acute illness as evidenced by estimated needs.  GOAL:   Patient will meet greater than or equal to 90% of their needs  MONITOR:   PO intake,Supplement acceptance,Weight trends,Labs,I & O's  REASON FOR ASSESSMENT:   Consult Diet education  ASSESSMENT:   Patient with PMH significant for thoracic and abdominal aortic aneurysm, OSA, ITP, CAD, and HTN. Presents this admission with acute diverticular bleed.   5/1- extubated   Patient denies loss in appetite PTA. Typically consumes three meals daily that consist of good protein sources. Appetite this admission progressing. Last meal completions charted as 100%. Weight noted to be stable over the last year. Patient denies recent weight loss.   RD provided "Fiber Restricted Nutrition Therapy" handout as well as "5 Sample Menus for Gradually Increasing Fiber" handout from the Academy of Nutrition and Dietetics. Reviewed patient's dietary recall and discussed ways for pt to meet nutrition goals over the next several weeks. Reviewed low fiber foods and high fiber foods.   Medications: reviewed  Labs: CBG 97-131  NUTRITION - FOCUSED PHYSICAL EXAM:  Flowsheet Row Most Recent Value  Orbital Region No depletion  Upper Arm Region Mild depletion  Thoracic and Lumbar Region Unable to assess  Buccal Region No depletion  Temple Region No depletion  Clavicle Bone Region No depletion  Clavicle and Acromion Bone Region No depletion  Scapular Bone Region Unable to assess  Dorsal Hand No depletion  Patellar Region No depletion  Anterior Thigh Region No depletion  Posterior Calf Region No depletion  Edema (RD Assessment) Mild  Hair Reviewed  Eyes Reviewed  Mouth  Reviewed  Skin Reviewed  Nails Reviewed     Diet Order:   Diet Order            Diet regular Room service appropriate? Yes; Fluid consistency: Thin  Diet effective now                 EDUCATION NEEDS:   Education needs have been addressed  Skin:  Skin Assessment: Reviewed RN Assessment  Last BM:  5/2  Height:   Ht Readings from Last 1 Encounters:  05/24/20 5\' 9"  (1.753 m)    Weight:   Wt Readings from Last 1 Encounters:  05/27/20 102.5 kg    BMI:  Body mass index is 33.37 kg/m.  Estimated Nutritional Needs:   Kcal:  2200-2400 kcal  Protein:  110-125 grams  Fluid:  >/= 2 L/day  Mariana Single RD, LDN Clinical Nutrition Pager listed in [place]

## 2020-05-28 NOTE — Evaluation (Signed)
Physical Therapy Evaluation/ Discharge Patient Details Name: Lonnie Hansen MRN: [id number] DOB: 1951/01/05 Today's Date: 05/28/2020   History of Present Illness  70 yo admitted 4/29 with GIB with near syncope. 4/30 PEA arrest after BM. Extubated 5/1. PMhx: AAA, thoracic aortic aneurysm, CAD, OSA, HTN, GERD, diverticulosis, HLD  Clinical Impression  Pt very pleasant sitting in chair on arrival. 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 Pt walks with cane at baseline due to Rt hip OA and demonstrates baseline functional mobility with all basic transfers and gait this session. No further acute therapy needs with pt HR 130 with all activity and encouraged to continue walking acutely. Will sign off with pt aware and agreeable.      Follow Up Recommendations No PT follow up    Equipment Recommendations  None recommended by PT    Recommendations for Other Services       Precautions / Restrictions Precautions Precautions: Fall Precaution Comments: bad right hip      Mobility  Bed Mobility               General bed mobility comments: in chair on arrival    Transfers Overall transfer level: Modified independent                  Ambulation/Gait Ambulation/Gait assistance: Modified independent (Device/Increase time) Gait Distance (Feet): 400 Feet Assistive device: Rolling walker (2 wheeled) Gait Pattern/deviations: Step-through pattern;Decreased stride length;Decreased stance time - right;Antalgic   Gait velocity interpretation: >2.62 ft/sec, indicative of community ambulatory General Gait Details: pt with antalgic gait which he reports as baseline with HR 130  Stairs Stairs: Yes Stairs assistance: Modified independent (Device/Increase time) Stair Management: One rail Right;Step to pattern;Forwards Number of Stairs: 4 General stair comments: pt with steady gait with stairs with use of rail  Wheelchair Mobility     Modified Rankin (Stroke Patients Only)       Balance Overall balance assessment: Mild deficits observed, not formally tested                                           Pertinent Vitals/Pain Pain Assessment: No/denies pain    Home Living Family/patient expects to be discharged to:: Private residence Living Arrangements: Spouse/significant other Available Help at Discharge: Family;Available 24 hours/day Type of Home: House Home Access: Stairs to enter Entrance Stairs-Rails: Right Entrance Stairs-Number of Steps: 3 Home Layout: Two level;Able to live on main level with bedroom/bathroom Home Equipment: Gilford Rile - 2 wheels;Bedside commode;Cane - single point      Prior Function Level of Independence: Independent with assistive device(s)         Comments: 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     Hand Dominance        Extremity/Trunk Assessment   Upper Extremity Assessment Upper Extremity Assessment: Overall WFL for tasks assessed    Lower Extremity Assessment Lower Extremity Assessment: RLE deficits/detail RLE Deficits / Details: baseline Rt hip dysfunction    Cervical / Trunk Assessment Cervical / Trunk Assessment: Normal  Communication   Communication: No difficulties  Cognition Arousal/Alertness: Awake/alert Behavior During Therapy: WFL for tasks assessed/performed Overall Cognitive Status: Within Functional Limits for tasks assessed  General Comments      Exercises     Assessment/Plan    PT Assessment Patent does not need any further PT services  PT Problem List         PT Treatment Interventions      PT Goals (Current goals can be found in the Care Plan section)  Acute Rehab PT Goals PT Goal Formulation: All assessment and education complete, DC therapy    Frequency     Barriers to discharge        Co-evaluation               AM-PAC PT "6 Clicks" Mobility   Outcome Measure Help needed turning from your back to your side while in a flat bed without using bedrails?: None Help needed moving from lying on your back to sitting on the side of a flat bed without using bedrails?: None Help needed moving to and from a bed to a chair (including a wheelchair)?: None Help needed standing up from a chair using your arms (e.g., wheelchair or bedside chair)?: None Help needed to walk in hospital room?: None Help needed climbing 3-5 steps with a railing? : None 6 Click Score: 24    End of Session   Activity Tolerance: Patient tolerated treatment well Patient left: in chair;with call bell/phone within reach Nurse Communication: Mobility status PT Visit Diagnosis: Other abnormalities of gait and mobility (R26.89)    Time: 0375-4360 PT Time Calculation (min) (ACUTE ONLY): 18 min   Charges:   PT Evaluation $PT Eval Moderate Complexity: 1 Mod          Aristide Waggle P, PT Acute Rehabilitation Services Pager: [phone] Office: [place] 05/28/2020, 12:16 PM

## 2020-05-29 DIAGNOSIS — K5791 Diverticulosis of intestine, part unspecified, without perforation or abscess with bleeding: Secondary | ICD-10-CM | POA: Diagnosis not present

## 2020-05-29 LAB — CBC
HCT: 27.7 % — ABNORMAL LOW (ref 39.0–52.0)
Hemoglobin: 9.2 g/dL — ABNORMAL LOW (ref 13.0–17.0)
MCH: 30 pg (ref 26.0–34.0)
MCHC: 33.2 g/dL (ref 30.0–36.0)
MCV: 90.2 fL (ref 80.0–100.0)
Platelets: 74 10*3/uL — ABNORMAL LOW (ref 150–400)
RBC: 3.07 MIL/uL — ABNORMAL LOW (ref 4.22–5.81)
RDW: 16.2 % — ABNORMAL HIGH (ref 11.5–15.5)
WBC: 7.9 10*3/uL (ref 4.0–10.5)
nRBC: 0 % (ref 0.0–0.2)

## 2020-05-29 MED ORDER — ATENOLOL 25 MG PO TABS
12.5000 mg | ORAL_TABLET | Freq: Two times a day (BID) | ORAL | Status: DC
  Administered 2020-05-29: 12.5 mg via ORAL
  Filled 2020-05-29: qty 1

## 2020-05-29 MED ORDER — ACETAMINOPHEN 325 MG PO TABS: 650.0000 mg | ORAL_TABLET | Freq: Four times a day (QID) | ORAL | Status: DC | PRN

## 2020-05-29 NOTE — Discharge Summary (Signed)
Physician Discharge Summary         Patient ID: Lonnie Hansen MRN: [id number] DOB/AGE: 71-Oct-1952 71 y.o.  Admit date: 05/24/2020 Discharge date: 05/29/2020  Discharge Diagnoses:    Hemorrhagic shock: secondary to acute lower GI bleed likely due to diverticular bleed CAD HTN Thoracic and AAA OSA on CPAP ITP   Discharge summary     70 year old male with past medical history as below, which is significant for thoracic and abdominal aortic aneurysm, obstructive sleep apnea, history of  ITP, coronary artery disease, and hypertension.    He had an admission to [facility] in March of last year with similar complaints and was found to have multiple diverticula on colonoscopy, however, no obvious bleeding at the time of examination.   Prior to admission he reported, no alcohol use but he does report taking approximately 500 mg of aspirin daily for arthritic hip pain.  He also reported ongoing episodes of abdominal cramping followed by bright red blood per rectum.  He presented [facility] department in the evening hours of 4/29 with complaints of bright red blood per rectum starting earlier that day with associated abdominal cramping. He had 2 near syncopal episodes in the waiting room and was brought emergently back to a room. He would occasionally have subsequent drops in blood pressure and heart rate to the 79T systolic and 90Z respectively.  He had a brief episode where it was reported that pulses were lost and he was unresponsive. CPR was initiated, 1 epi given, ROSC achieved after 2 minutes. He was then intubated and sedated. He was given 4 L of crystalloids and transfused 2 units of PRBC in the emergency department prompting PCCM consultation.  Her was admitted to the [facility] ICU on 4/29 for his GIB. A right IJ cordis and TLC was placed. Total administration of blood product per chart review was 12 PRBC, 1 cryo, 8 FFP, 1 PLt and txa during this acute phase. [facility] was  consulted. An EGD and flex sigmoidsocy was completed. He was found to have a diverticular bleed. IR was consulted and on 4/30 a CTA ABD/Pelvis and a Nuclear medicine GI Blood loss study was completed. CTA was negative for acute GI hemmorhage, NM GI study was "suspicious for but not confirmatory of active GI bleeding from the distal rectum." IR signed off on 4/30 with plans to re consult if further bleeding occurred.  Lonnie Hansen was extubated on 5/1 without complications, a diet was started on 5/2.   He was transferred to [place] on 5/3   Lonnie Hansen has been evaluated by PT/OT on 5/3 with recommendations for discharge to home.   Today 5/4, he is tolerating his diet without any nausea. He has had a documented bowel movement on 5/3 and is passing gas. He is hemodynamically stable. Denies chest pain, SOB, hemoptysis, emesis, melena, BRBPR, dizziness, or fatigue   He feels that he is medically ready for discharge    Discharge Plan by Active Problems    Hemorrhagic shock: secondary to acute lower GI bleed likely due to diverticular bleed -Continue PPI -Counceled on unpredictability of diverticular bleeds and potential unpredictability -Talked with [name] regarding GI follow up. Agreed to follow up with outpatient GI -Follow up with PCP -Educated to not take any products with Asprin or containing Asprin. Do not take any NSAID. Patient states "I'm done with them" -Follow up with [facility].  OSA on CPAP -Continue home CPAP -Follow  up with PCP  CAD HTN Thoracic and AAA -Continue home amlodipine, rosuvastatin, restarted home atenolol.  -Follow up with PCP -Follow up with cardiologist- Dr. Martinique   ITP: platelets 97K on admission, as low as 50 but now uptrending -Follow up with PCP -Talked with [name] regarding outpatient hematology follow up. Agreed to follow up with heme - follow CBC - Outpatient heme follow up with [name]   [facility]  tests/ studies  4/30 CTA abd pelvis negative for acute GI hemmorhage 4/30  NM GI study "suspicious for but not confirmatory of active GI bleeding from the distal rectum."   Procedures   4/29- EGD/Flex sig  Culture data/antimicrobials   None   [facility]    Discharge Exam: BP (!) 128/96 (BP Location: Right Arm) Comment: not. nurse  Pulse 79   Temp 98.9 F (37.2 C) (Oral)   Resp 18   Ht 5\' 9"  (1.753 m)   Wt 102.5 kg   SpO2 99%   BMI 33.37 kg/m   General: Well nourished, in bed, NAD  HEENT: MM pink/moist, icteric/anicteric, trachea midline  Neuro: GCS 15, RASS 0, PERRL 81mm CV: S1S2, RRR, no m/r/g appreciated PULM:  Clear in the upper lobes, Clear in the lower lobes, Clear secretions, chest expansion symmetric GI: soft, bsx4 active/hypoactive   Extremities: warm/dry, no edema, capillary refill less than 3 seconds  Skin: no rashes or lesions  Labs at discharge   Lab Results  Component Value Date   CREATININE 1.06 05/28/2020   BUN 8 05/28/2020   NA 140 05/28/2020   K 3.6 05/28/2020   CL 104 05/28/2020   CO2 28 05/28/2020   Lab Results  Component Value Date   WBC 7.9 05/29/2020   HGB 9.2 (L) 05/29/2020   HCT 27.7 (L) 05/29/2020   MCV 90.2 05/29/2020   PLT 74 (L) 05/29/2020   Lab Results  Component Value Date   ALT 18 05/25/2020   AST 26 05/25/2020   ALKPHOS 32 (L) 05/25/2020   BILITOT 1.2 05/25/2020   Lab Results  Component Value Date   INR 1.2 05/25/2020   INR 1.1 05/24/2020   INR 1.15 10/30/2016    Current radiological studies    No results found.  Disposition:    Discharge disposition: 01-Home or Self Care     Home  Discharge Instructions    Call MD for:  difficulty breathing, headache or visual disturbances   Complete by: As directed    Call MD for:  extreme fatigue   Complete by: As directed    Call MD for:  hives   Complete by: As directed    Call MD for:  persistant dizziness or  light-headedness   Complete by: As directed    Call MD for:  persistant nausea and vomiting   Complete by: As directed    Call MD for:  redness, tenderness, or signs of infection (pain, swelling, redness, odor or green/yellow discharge around incision site)   Complete by: As directed    Call MD for:  severe uncontrolled pain   Complete by: As directed    Call MD for:  temperature >100.4   Complete by: As directed    Diet general   Complete by: As directed    Soft   Discharge instructions   Complete by: As directed    Please follow up with your PCP in 1 week. Please call for follow up with your cardiologist, [name], and  [facility].   Please refrain from taking Asprin or NSAIDs, or products containing Asprin or NSAIDs. Take tylenol as prescribed for pain. Please discuss this with your PCP.      Allergies as of 05/29/2020   No Known Allergies     Medication List    STOP taking these medications   aspirin 81 MG EC tablet   doxycycline 100 MG tablet Commonly known as: VIBRA-TABS   GOODY HEADACHE PO   predniSONE 10 MG tablet Commonly known as: DELTASONE     TAKE these medications   acetaminophen 325 MG tablet Commonly known as: TYLENOL Take 2 tablets (650 mg total) by mouth every 6 (six) hours as needed for mild pain or fever.   amLODipine 10 MG tablet Commonly known as: NORVASC Take 10 mg by mouth daily at 12 noon.   atenolol 25 MG tablet Commonly known as: TENORMIN Take 0.5 tablets (12.5 mg total) by mouth 2 (two) times daily.   Ensure Plus Liqd Take 237 mLs by mouth daily.   ferrous sulfate 325 (65 FE) MG tablet Take 325 mg by mouth daily at 12 noon.   fluticasone 50 MCG/ACT nasal spray Commonly known as: FLONASE Place 1 spray into both nostrils daily as needed for allergies or rhinitis.   montelukast 10 MG tablet Commonly known as: SINGULAIR Take 10 mg by mouth daily at 12 noon.   multivitamin with minerals Tabs tablet Take 1 tablet by mouth daily at  12 noon.   omega-3 acid ethyl esters 1 g capsule Commonly known as: Lovaza Take 2 capsules (2 g total) by mouth 2 (two) times daily.   pantoprazole 40 MG tablet Commonly known as: PROTONIX Take 40 mg by mouth daily at 12 noon.   rosuvastatin 10 MG tablet Commonly known as: CRESTOR Take 1 tablet by mouth once daily What changed: when to take this   triamcinolone cream 0.1 % Commonly known as: KENALOG Apply 1 application topically 2 (two) times daily as needed (facial itching/rash).        Follow-up appointment   PCP, Dr. Martinique, [name], [name]. See AVS   Discharge Condition:    stable  Provider Statement:   The Patient was personally examined, the discharge assessment and plan has been personally reviewed and I agree with AGACNP Lelia Jons's assessment and plan. 35 minutes of time have been dedicated to discharge assessment, planning and discharge instructions.   Signed: Estill Cotta 05/29/2020, 12:07 PM

## 2020-05-29 NOTE — Progress Notes (Signed)
Reviewed discharge summary and instructions with pt.  Verbalized understanding.

## 2020-05-29 NOTE — Care Management Important Message (Signed)
Important Message  Patient Details  Name: Lonnie [facility] MRN: [id number] Date of Birth: 01-23-1951   Medicare Important Message Given:  Yes     Orbie Pyo 05/29/2020, 3:14 PM

## 2020-05-29 NOTE — Consult Note (Signed)
   [facility] CM Inpatient Consult   05/29/2020  Lonnie Hansen 1950-07-02 [id number]   [employer] [ACO] Patient:  Medicare CMS DCE  Primary Care Provider:  [name], MD  Patient evaluated for community based chronic complex disease management services with [facility] as a benefit of patient's Medicare Insurance.  Plan:  Patient to be followed for post hospital care needs.    Of note, [facility] Care Management services does not replace or interfere with any services that are arranged by inpatient case management or social work.  For additional questions or referrals please contact:    Natividad Brood, RN BSN [facility] Liaison  [phone] business mobile phone Toll free office [phone]  Fax number: [phone] [name]  [email or web address]

## 2020-05-30 ENCOUNTER — Telehealth: Payer: Self-pay

## 2020-05-30 ENCOUNTER — Encounter: Payer: Self-pay | Admitting: *Deleted

## 2020-05-30 ENCOUNTER — Other Ambulatory Visit: Payer: Self-pay | Admitting: *Deleted

## 2020-05-30 NOTE — Telephone Encounter (Signed)
Called pt with appts per sch message and he is aware   Lonnie Hansen

## 2020-05-30 NOTE — Patient Outreach (Signed)
[place] [facility]) Care Management  [place]  06/01/2020   Lonnie Hansen April 11, 1950 [id number]   Referral Date: 5/5 Referral Source: Hospital liaison Referral Reason: Recent hospital discharge Insurance: Traditional Medicare   Outreach attempt #1, successful.  Identity verified.  This care manager introduced self and stated purpose of call.  [facility] care management services explained.    Social: Lives with wife, state he is independent with all ADL's.  Uses can for ambulation, does have walker if needed.  Was evaluated for PT/OT, not recommended at discharge.  Conditions: Per chart, has history of idiopathic thrombocytopenia, HTN, Thoracic aortic aneurysm, CAD, AVR, OSA, GERD, diverticulosis, and most recently admitted to hospital for GI bleed.  Medications: Reviewed with member, denies need for financial assistance.  Appointments:  Has follow up appointments scheduled, denies need for transportation assistance.  Advance Directives: Report his sister is his medical POA, has living will. Does not want to change at this time.  Consent:  Agrees to participate in [facility] services.  Encounter Medications:  No facility-administered encounter medications on file as of 05/30/2020.   Outpatient Encounter Medications as of 05/30/2020  Medication Sig Note  . acetaminophen (TYLENOL) 325 MG tablet Take 2 tablets (650 mg total) by mouth every 6 (six) hours as needed for mild pain or fever.   Marland Kitchen amLODipine (NORVASC) 10 MG tablet Take 10 mg by mouth daily at 12 noon.   . Ensure Plus (ENSURE PLUS) LIQD Take 237 mLs by mouth daily.   . ferrous sulfate 325 (65 FE) MG tablet Take 325 mg by mouth daily at 12 noon.   . fluticasone (FLONASE) 50 MCG/ACT nasal spray Place 1 spray into both nostrils daily as needed for allergies or rhinitis.   Marland Kitchen montelukast (SINGULAIR) 10 MG tablet Take 10 mg by mouth at bedtime.   . Multiple Vitamin (MULTIVITAMIN WITH MINERALS) TABS tablet Take 1 tablet by  mouth daily at 12 noon.   Marland Kitchen omega-3 acid ethyl esters (LOVAZA) 1 g capsule Take 2 capsules (2 g total) by mouth 2 (two) times daily. (Patient not taking: Reported on 05/31/2020)   . pantoprazole (PROTONIX) 40 MG tablet Take 20 mg by mouth 2 (two) times daily.   . rosuvastatin (CRESTOR) 10 MG tablet Take 1 tablet by mouth once daily (Patient taking differently: Take 10 mg by mouth daily at 12 noon.)   . triamcinolone cream (KENALOG) 0.1 % Apply 1 application topically 2 (two) times daily as needed (facial itching/rash).   . [DISCONTINUED] atenolol (TENORMIN) 25 MG tablet Take 0.5 tablets (12.5 mg total) by mouth 2 (two) times daily. 05/25/2020: Wife stated that the amlodipine replaced the atenolol    Functional Status:  In your present state of health, do you have any difficulty performing the following activities: 05/31/2020 05/31/2020  Hearing? N -  Vision? N -  Difficulty concentrating or making decisions? N -  Walking or climbing stairs? N -  Dressing or bathing? N -  Doing errands, shopping? - N  Some recent data might be hidden    Fall/Depression Screening: Fall Risk  05/30/2020 05/31/2017 12/03/2016  Falls in the past year? 0 No No  Number falls in past yr: 0 - -  Injury with Fall? 0 - -   PHQ 2/9 Scores 05/30/2020 03/15/2017 12/07/2016  PHQ - 2 Score 0 0 0    Assessment:  Goals Addressed            This Visit's Progress   . Make and Keep  All Appointments       Timeframe:  Short-Term Goal Priority:  High Start Date:           5/5                  Expected End Date:    6/5                   Barriers: Knowledge    - ask family or friend for a ride - call to cancel if needed    Why is this important?    Part of staying healthy is seeing the doctor for follow-up care.   If you forget your appointments, there are some things you can do to stay on track.    Notes:   5/5 - Follow up with PCP on 5/6, GI on 5/23, and Cardioloy on 5/27    . [place] My Quality of Life        Timeframe:  Long-Range Goal Priority:  Medium Start Date:     5/5                        Expected End Date:   7/5                    Barriers: Knowledge    - complete a living will - discuss my treatment options with the doctor or nurse    Why is this important?    Having a long-term illness can be scary.   It can also be stressful for you and your caregiver.   These steps may help.    Notes:   5/5 - Educated on signs of bleeding.  Encouraged to monitor blood pressure daily and record readings.  Blood pressure monitor sent.       Plan:  Follow-up:  Patient agrees to Care Plan and Follow-up.  Will send BP machine and education regarding GI bleed.  Will notify PCP of involvement.  Will follow up within the next 2 weeks.  [name], [place], MSN [place] [phone]

## 2020-05-30 NOTE — Telephone Encounter (Signed)
Called pt with hosp f/u appt   Lonnie Hansen

## 2020-05-30 NOTE — Patient Instructions (Signed)

## 2020-05-31 ENCOUNTER — Inpatient Hospital Stay (HOSPITAL_COMMUNITY): Payer: Medicare Other

## 2020-05-31 ENCOUNTER — Other Ambulatory Visit: Payer: Self-pay

## 2020-05-31 ENCOUNTER — Encounter (HOSPITAL_COMMUNITY): Payer: Self-pay

## 2020-05-31 ENCOUNTER — Inpatient Hospital Stay (HOSPITAL_COMMUNITY)
Admission: EM | Admit: 2020-05-31 | Discharge: 2020-06-03 | DRG: 378 | Disposition: A | Discharge status: Home / Self Care | Payer: Medicare Other | Attending: Internal Medicine | Admitting: Internal Medicine

## 2020-05-31 DIAGNOSIS — N281 Cyst of kidney, acquired: Secondary | ICD-10-CM | POA: Diagnosis not present

## 2020-05-31 DIAGNOSIS — H269 Unspecified cataract: Secondary | ICD-10-CM | POA: Diagnosis present

## 2020-05-31 DIAGNOSIS — M1990 Unspecified osteoarthritis, unspecified site: Secondary | ICD-10-CM | POA: Diagnosis present

## 2020-05-31 DIAGNOSIS — E876 Hypokalemia: Secondary | ICD-10-CM | POA: Diagnosis present

## 2020-05-31 DIAGNOSIS — Z20822 Contact with and (suspected) exposure to covid-19: Secondary | ICD-10-CM | POA: Diagnosis present

## 2020-05-31 DIAGNOSIS — D72829 Elevated white blood cell count, unspecified: Secondary | ICD-10-CM | POA: Diagnosis present

## 2020-05-31 DIAGNOSIS — G4733 Obstructive sleep apnea (adult) (pediatric): Secondary | ICD-10-CM | POA: Diagnosis not present

## 2020-05-31 DIAGNOSIS — D62 Acute posthemorrhagic anemia: Secondary | ICD-10-CM | POA: Diagnosis present

## 2020-05-31 DIAGNOSIS — N400 Enlarged prostate without lower urinary tract symptoms: Secondary | ICD-10-CM | POA: Diagnosis present

## 2020-05-31 DIAGNOSIS — K625 Hemorrhage of anus and rectum: Secondary | ICD-10-CM | POA: Diagnosis not present

## 2020-05-31 DIAGNOSIS — E785 Hyperlipidemia, unspecified: Secondary | ICD-10-CM | POA: Diagnosis not present

## 2020-05-31 DIAGNOSIS — D693 Immune thrombocytopenic purpura: Secondary | ICD-10-CM | POA: Diagnosis not present

## 2020-05-31 DIAGNOSIS — I714 Abdominal aortic aneurysm, without rupture: Secondary | ICD-10-CM | POA: Diagnosis present

## 2020-05-31 DIAGNOSIS — H409 Unspecified glaucoma: Secondary | ICD-10-CM | POA: Diagnosis present

## 2020-05-31 DIAGNOSIS — R001 Bradycardia, unspecified: Secondary | ICD-10-CM | POA: Diagnosis present

## 2020-05-31 DIAGNOSIS — K648 Other hemorrhoids: Secondary | ICD-10-CM | POA: Diagnosis not present

## 2020-05-31 DIAGNOSIS — N179 Acute kidney failure, unspecified: Secondary | ICD-10-CM | POA: Diagnosis not present

## 2020-05-31 DIAGNOSIS — I712 Thoracic aortic aneurysm, without rupture, unspecified: Secondary | ICD-10-CM | POA: Diagnosis present

## 2020-05-31 DIAGNOSIS — K219 Gastro-esophageal reflux disease without esophagitis: Secondary | ICD-10-CM | POA: Diagnosis present

## 2020-05-31 DIAGNOSIS — Z8349 Family history of other endocrine, nutritional and metabolic diseases: Secondary | ICD-10-CM

## 2020-05-31 DIAGNOSIS — K8020 Calculus of gallbladder without cholecystitis without obstruction: Secondary | ICD-10-CM | POA: Diagnosis not present

## 2020-05-31 DIAGNOSIS — Z9989 Dependence on other enabling machines and devices: Secondary | ICD-10-CM | POA: Diagnosis not present

## 2020-05-31 DIAGNOSIS — Z87891 Personal history of nicotine dependence: Secondary | ICD-10-CM | POA: Diagnosis not present

## 2020-05-31 DIAGNOSIS — D649 Anemia, unspecified: Secondary | ICD-10-CM | POA: Diagnosis not present

## 2020-05-31 DIAGNOSIS — K5731 Diverticulosis of large intestine without perforation or abscess with bleeding: Secondary | ICD-10-CM | POA: Diagnosis not present

## 2020-05-31 DIAGNOSIS — I10 Essential (primary) hypertension: Secondary | ICD-10-CM | POA: Diagnosis present

## 2020-05-31 DIAGNOSIS — K922 Gastrointestinal hemorrhage, unspecified: Secondary | ICD-10-CM | POA: Diagnosis present

## 2020-05-31 DIAGNOSIS — Z79899 Other long term (current) drug therapy: Secondary | ICD-10-CM

## 2020-05-31 DIAGNOSIS — Z8249 Family history of ischemic heart disease and other diseases of the circulatory system: Secondary | ICD-10-CM | POA: Diagnosis not present

## 2020-05-31 DIAGNOSIS — Z862 Personal history of diseases of the blood and blood-forming organs and certain disorders involving the immune mechanism: Secondary | ICD-10-CM | POA: Diagnosis not present

## 2020-05-31 DIAGNOSIS — D6489 Other specified anemias: Secondary | ICD-10-CM | POA: Diagnosis not present

## 2020-05-31 DIAGNOSIS — K5791 Diverticulosis of intestine, part unspecified, without perforation or abscess with bleeding: Secondary | ICD-10-CM | POA: Diagnosis not present

## 2020-05-31 DIAGNOSIS — Z8719 Personal history of other diseases of the digestive system: Secondary | ICD-10-CM

## 2020-05-31 DIAGNOSIS — I2510 Atherosclerotic heart disease of native coronary artery without angina pectoris: Secondary | ICD-10-CM | POA: Diagnosis not present

## 2020-05-31 LAB — CBC WITH DIFFERENTIAL/PLATELET
Abs Immature Granulocytes: 0.13 10*3/uL — ABNORMAL HIGH (ref 0.00–0.07)
Basophils Absolute: 0 10*3/uL (ref 0.0–0.1)
Basophils Relative: 0 %
Eosinophils Absolute: 0.3 10*3/uL (ref 0.0–0.5)
Eosinophils Relative: 2 %
HCT: 30.1 % — ABNORMAL LOW (ref 39.0–52.0)
Hemoglobin: 9.9 g/dL — ABNORMAL LOW (ref 13.0–17.0)
Immature Granulocytes: 1 %
Lymphocytes Relative: 15 %
Lymphs Abs: 1.8 10*3/uL (ref 0.7–4.0)
MCH: 30.2 pg (ref 26.0–34.0)
MCHC: 32.9 g/dL (ref 30.0–36.0)
MCV: 91.8 fL (ref 80.0–100.0)
Monocytes Absolute: 1.2 10*3/uL — ABNORMAL HIGH (ref 0.1–1.0)
Monocytes Relative: 9 %
Neutro Abs: 8.8 10*3/uL — ABNORMAL HIGH (ref 1.7–7.7)
Neutrophils Relative %: 73 %
Platelets: UNDETERMINED 10*3/uL (ref 150–400)
RBC: 3.28 MIL/uL — ABNORMAL LOW (ref 4.22–5.81)
RDW: 16.8 % — ABNORMAL HIGH (ref 11.5–15.5)
WBC: 12.2 10*3/uL — ABNORMAL HIGH (ref 4.0–10.5)
nRBC: 0.2 % (ref 0.0–0.2)

## 2020-05-31 LAB — COMPREHENSIVE METABOLIC PANEL
ALT: 35 U/L (ref 0–44)
AST: 39 U/L (ref 15–41)
Albumin: 3.5 g/dL (ref 3.5–5.0)
Alkaline Phosphatase: 43 U/L (ref 38–126)
Anion gap: 11 (ref 5–15)
BUN: 15 mg/dL (ref 8–23)
CO2: 26 mmol/L (ref 22–32)
Calcium: 9.4 mg/dL (ref 8.9–10.3)
Chloride: 101 mmol/L (ref 98–111)
Creatinine, Ser: 1.37 mg/dL — ABNORMAL HIGH (ref 0.61–1.24)
GFR, Estimated: 55 mL/min — ABNORMAL LOW (ref >60)
Glucose, Bld: 136 mg/dL — ABNORMAL HIGH (ref 70–99)
Potassium: 3.2 mmol/L — ABNORMAL LOW (ref 3.5–5.1)
Sodium: 138 mmol/L (ref 135–145)
Total Bilirubin: 0.6 mg/dL (ref 0.3–1.2)
Total Protein: 6.4 g/dL — ABNORMAL LOW (ref 6.5–8.1)

## 2020-05-31 LAB — SARS CORONAVIRUS 2 (TAT 6-24 HRS): SARS Coronavirus 2: NEGATIVE

## 2020-05-31 LAB — PREPARE RBC (CROSSMATCH)

## 2020-05-31 LAB — POC OCCULT BLOOD, ED: Fecal Occult Bld: POSITIVE — AB

## 2020-05-31 LAB — PROTIME-INR
INR: 1 (ref 0.8–1.2)
Prothrombin Time: 13.4 seconds (ref 11.4–15.2)

## 2020-05-31 LAB — CBC
HCT: 25.7 % — ABNORMAL LOW (ref 39.0–52.0)
Hemoglobin: 8.5 g/dL — ABNORMAL LOW (ref 13.0–17.0)
MCH: 30.4 pg (ref 26.0–34.0)
MCHC: 33.1 g/dL (ref 30.0–36.0)
MCV: 91.8 fL (ref 80.0–100.0)
Platelets: 93 10*3/uL — ABNORMAL LOW (ref 150–400)
RBC: 2.8 MIL/uL — ABNORMAL LOW (ref 4.22–5.81)
RDW: 16.6 % — ABNORMAL HIGH (ref 11.5–15.5)
WBC: 13.9 10*3/uL — ABNORMAL HIGH (ref 4.0–10.5)
nRBC: 0.5 % — ABNORMAL HIGH (ref 0.0–0.2)

## 2020-05-31 LAB — GLUCOSE, CAPILLARY: Glucose-Capillary: 152 mg/dL — ABNORMAL HIGH (ref 70–99)

## 2020-05-31 MED ORDER — PEG 3350-KCL-NA BICARB-NACL 420 G PO SOLR
4000.0000 mL | Freq: Once | ORAL | Status: AC
  Administered 2020-05-31: 4000 mL via ORAL
  Filled 2020-05-31: qty 4000

## 2020-05-31 MED ORDER — LACTATED RINGERS IV SOLN: INTRAVENOUS | Status: DC

## 2020-05-31 MED ORDER — SODIUM CHLORIDE 0.9 % IV SOLN: 10.0000 mL/h | Freq: Once | INTRAVENOUS | Status: DC

## 2020-05-31 MED ORDER — PANTOPRAZOLE SODIUM 40 MG IV SOLR
40.0000 mg | Freq: Two times a day (BID) | INTRAVENOUS | Status: DC
  Administered 2020-05-31 – 2020-06-01 (×3): 40 mg via INTRAVENOUS
  Filled 2020-05-31 (×2): qty 40

## 2020-05-31 MED ORDER — IOHEXOL 350 MG/ML SOLN
100.0000 mL | Freq: Once | INTRAVENOUS | Status: AC
  Administered 2020-05-31: 100 mL via INTRAVENOUS

## 2020-05-31 MED ORDER — SODIUM CHLORIDE 0.9% IV SOLUTION: Freq: Once | INTRAVENOUS | Status: DC

## 2020-05-31 MED ORDER — SODIUM CHLORIDE 0.9% FLUSH
3.0000 mL | Freq: Two times a day (BID) | INTRAVENOUS | Status: DC
  Administered 2020-05-31 – 2020-06-03 (×4): 3 mL via INTRAVENOUS

## 2020-05-31 MED ORDER — ACETAMINOPHEN 325 MG PO TABS
650.0000 mg | ORAL_TABLET | Freq: Four times a day (QID) | ORAL | Status: DC | PRN
  Administered 2020-06-01 – 2020-06-02 (×2): 650 mg via ORAL
  Filled 2020-05-31 (×2): qty 2

## 2020-05-31 MED ORDER — AMLODIPINE BESYLATE 10 MG PO TABS
10.0000 mg | ORAL_TABLET | Freq: Every day | ORAL | Status: DC
  Administered 2020-06-01 – 2020-06-03 (×3): 10 mg via ORAL
  Filled 2020-05-31 (×3): qty 1

## 2020-05-31 MED ORDER — ACETAMINOPHEN 650 MG RE SUPP: 650.0000 mg | Freq: Four times a day (QID) | RECTAL | Status: DC | PRN

## 2020-05-31 MED ORDER — ONDANSETRON HCL 4 MG/2ML IJ SOLN: 4.0000 mg | Freq: Four times a day (QID) | INTRAMUSCULAR | Status: DC | PRN

## 2020-05-31 MED ORDER — HYDRALAZINE HCL 20 MG/ML IJ SOLN: 5.0000 mg | INTRAMUSCULAR | Status: DC | PRN

## 2020-05-31 MED ORDER — MONTELUKAST SODIUM 10 MG PO TABS
10.0000 mg | ORAL_TABLET | Freq: Every day | ORAL | Status: DC
  Administered 2020-05-31 – 2020-06-03 (×4): 10 mg via ORAL
  Filled 2020-05-31 (×4): qty 1

## 2020-05-31 MED ORDER — ONDANSETRON HCL 4 MG PO TABS: 4.0000 mg | ORAL_TABLET | Freq: Four times a day (QID) | ORAL | Status: DC | PRN

## 2020-05-31 MED ORDER — ROSUVASTATIN CALCIUM 5 MG PO TABS
10.0000 mg | ORAL_TABLET | Freq: Every day | ORAL | Status: DC
  Administered 2020-06-01 – 2020-06-03 (×3): 10 mg via ORAL
  Filled 2020-05-31 (×3): qty 2

## 2020-05-31 MED ORDER — SODIUM CHLORIDE 0.9 % IV SOLN: INTRAVENOUS | Status: DC

## 2020-05-31 MED ORDER — TECHNETIUM TC 99M-LABELED RED BLOOD CELLS IV KIT
25.0000 | PACK | Freq: Once | INTRAVENOUS | Status: AC | PRN
  Administered 2020-05-31: 25 via INTRAVENOUS

## 2020-05-31 MED ORDER — SODIUM CHLORIDE 0.9 % IV BOLUS
1000.0000 mL | Freq: Once | INTRAVENOUS | Status: AC
  Administered 2020-05-31: 1000 mL via INTRAVENOUS

## 2020-05-31 MED ORDER — MORPHINE SULFATE (PF) 2 MG/ML IV SOLN: 2.0000 mg | INTRAVENOUS | Status: DC | PRN

## 2020-05-31 NOTE — H&P (View-Only) (Signed)
Referring Provider: Dr. Karmen Bongo [facility]) Primary Care Physician:  [name], MD Primary Gastroenterologist:  Dr. Watt Climes [facility])  Reason for Consultation: Rectal bleeding  HPI: Lonnie Hansen is a 70 y.o. male OSA on CPAP, HTN, CAD, chronic ITP, AAA, and GI bleeding with admission from 4/29-5/4 due to hemorrhagia shock presenting with recurrent rectal bleeding.  Patient states he started experiencing large-volume hematochezia this morning and thus presented to the ED.  He denies any abdominal pain, though does have some abdominal cramping when he passes bloody stool.  He denies any melena.  Denies changes in appetite, unexplained weight loss, GERD, dysphagia.  He does note that he has been taking 2 BC powders daily due to hip pain.  Denies blood thinner use.  He was recently hospitalized from 4/29 to 05/29/2020 for hemorrhagic shock due to presumed diverticular bleeding.    He underwent flex sig on 05/25/2020 in the early morning hours which revealed blood in the rectum, in the recto-sigmoid colon, in the sigmoid colon and in the descending colon.  Diverticulosis in the sigmoid colon.  He underwent EGD which revealed gastritis and nonbleeding duodenal diverticulum.  He then had nuclear bleeding scan on 05/25/2020 which was suspicious for GI bleeding from distal rectum.  CT-A to follow did not show evidence of active bleeding, though did showed extensive diverticulosis of descending and sigmoid colon.    Past Medical History:  Diagnosis Date  . AAA (abdominal aortic aneurysm) ([place])   . Anemia   . Arthritis   . Asymptomatic bilateral carotid artery stenosis 08/2015   1-39%   . Cataracts, bilateral   . 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 Coronary artery disease   . Diverticulosis   . Enlarged aorta ([place])   . Enlarged prostate    slightly  . GERD (gastroesophageal reflux disease)    takes Pantoprazole daily as needed  . Glaucoma    uses eye drops daily  .  Headache   . History of colon polyps    benign  . History of kidney stones   . Hyperlipidemia    no on any meds  . Hypertension    takes Amlodipine and Atenolol daily  . OSA on CPAP   . Vocal cord nodule    pt. states  it's a" growth on vocal cord"    Past Surgical History:  Procedure Laterality Date  . AORTIC ARCH ANGIOGRAPHY N/A 04/16/2016   Procedure: Aortic Arch Angiography;  Surgeon: [name] Martinique, MD;  Location: [place] CV LAB;  Service: Cardiovascular;  Laterality: N/A;  . AORTIC VALVE REPLACEMENT N/A 09/15/2016   Procedure: AORTIC VALVE REPLACEMENT (AVR);  Surgeon: Grace Isaac, MD;  Location: [place];  Service: Open Heart Surgery;  Laterality: N/A;  Using 89mm Edwards Perimount Magna Ease Aortic Bioprosthesis Valve  . ASCENDING AORTIC ROOT REPLACEMENT N/A 09/15/2016   Procedure: ASCENDING AORTIC ROOT REPLACEMENT;  Surgeon: Grace Isaac, MD;  Location: [place];  Service: Open Heart Surgery;  Laterality: N/A;  Using 29mm Gelweave Valsalva Graft  . COLONOSCOPY    . COLONOSCOPY Left 04/16/2019   Procedure: COLONOSCOPY;  Surgeon: Arta Silence, MD;  Location: [facility] ENDOSCOPY;  Service: Endoscopy;  Laterality: Left;  . COLONOSCOPY WITH ESOPHAGOGASTRODUODENOSCOPY (EGD)    . ESOPHAGOGASTRODUODENOSCOPY N/A 05/25/2020   Procedure: ESOPHAGOGASTRODUODENOSCOPY (EGD);  Surgeon: Wilford Corner, MD;  Location: [place];  Service: Endoscopy;  Laterality: N/A;  . ESOPHAGOGASTRODUODENOSCOPY (EGD) WITH PROPOFOL Left 04/16/2019   Procedure: ESOPHAGOGASTRODUODENOSCOPY (EGD) WITH PROPOFOL;  Surgeon: Arta Silence, MD;  Location[facility] END[facility]COPY;  Service: Endoscopy;  Laterality: Left;  . FLEXIBLE SIGMOIDOSCOPY N/A 05/25/2020   Procedure: FLEXIBLE SIGMOIDOSCOPY;  Surgeon: Wilford Corner, MD;  Location: [place];  Service: Endoscopy;  Laterality: N/A;  . IR THORACENTESIS ASP PLEURAL SPACE W/IMG GUIDE  10/23/2016  . MICROLARYNGOSCOPY Right 09/20/2017   Procedure: MICROLARYNGOSCOPY  WITH  EXCISION OF VOCAL CORD LESION;  Surgeon: Leta Baptist, MD;  Location: [place];  Service: ENT;  Laterality: Right;  . MULTIPLE EXTRACTIONS WITH ALVEOLOPLASTY N/A 06/10/2016   Procedure: Extraction of tooth #'s 2,8,13,15, and 29  with alveoloplasty, maxillary right and left buccal exostoses reductions, and gross debridement of remaining teeth.;  Surgeon: Lenn Cal, DDS;  Location: [place];  Service: Oral Surgery;  Laterality: N/A;  . RIGHT/LEFT HEART CATH AND CORONARY ANGIOGRAPHY N/A 04/16/2016   Procedure: Right/Left Heart Cath and Coronary Angiography;  Surgeon: [name] Martinique, MD;  Location: [place] CV LAB;  Service: Cardiovascular;  Laterality: N/A;  . TEE WITHOUT CARDIOVERSION N/A 09/15/2016   Procedure: TRANSESOPHAGEAL ECHOCARDIOGRAM (TEE);  Surgeon: Grace Isaac, MD;  Location: [place];  Service: Open Heart Surgery;  Laterality: N/A;    Prior to Admission medications   Medication Sig Start Date End Date Taking? Authorizing Provider  acetaminophen (TYLENOL) 325 MG tablet Take 2 tablets (650 mg total) by mouth every 6 (six) hours as needed for mild pain or fever. 05/29/20   Estill Cotta, NP  amLODipine (NORVASC) 10 MG tablet Take 10 mg by mouth daily at 12 noon.    [provider]  Ensure Plus (ENSURE PLUS) LIQD Take 237 mLs by mouth daily.    [provider]  ferrous sulfate 325 (65 FE) MG tablet Take 325 mg by mouth daily at 12 noon.    [provider]  fluticasone (FLONASE) 50 MCG/ACT nasal spray Place 1 spray into both nostrils daily as needed for allergies or rhinitis. 04/16/20   [provider]  montelukast (SINGULAIR) 10 MG tablet Take 10 mg by mouth daily at 12 noon. 05/08/20   [provider]  Multiple Vitamin (MULTIVITAMIN WITH MINERALS) TABS tablet Take 1 tablet by mouth daily at 12 noon.    [provider]  omega-3 acid ethyl esters (LOVAZA) 1 g capsule Take 2 capsules (2 g total) by mouth 2 (two)  times daily. 05/12/19   Deberah Pelton, NP  pantoprazole (PROTONIX) 40 MG tablet Take 40 mg by mouth daily at 12 noon. 03/11/19   [provider]  rosuvastatin (CRESTOR) 10 MG tablet Take 1 tablet by mouth once daily Patient taking differently: Take 10 mg by mouth daily at 12 noon. 05/17/20   Martinique, [name], MD  triamcinolone cream (KENALOG) 0.1 % Apply 1 application topically 2 (two) times daily as needed (facial itching/rash). 05/16/20   [provider]    Scheduled Meds: . [START ON 06/01/2020] amLODipine  10 mg Oral Q1200  . montelukast  10 mg Oral Q1200  . pantoprazole (PROTONIX) IV  40 mg Intravenous Q12H  . polyethylene glycol-electrolytes  4,000 mL Oral Once  . [START ON 06/01/2020] rosuvastatin  10 mg Oral Q1200  . sodium chloride flush  3 mL Intravenous Q12H   Continuous Infusions: . lactated ringers     PRN Meds:.acetaminophen **OR** acetaminophen, hydrALAZINE, morphine injection, ondansetron **OR** ondansetron (ZOFRAN) IV  Allergies as of 05/31/2020  . (No Known Allergies)    Family History  Problem Relation Age of Onset  .  Heart disease Father   . Heart attack Father   . Thyroid disease Sister     Social History   Socioeconomic History  . Marital status: Married    Spouse name: Not on file  . Number of children: 1  . Years of education: Not on file  . Highest education level: Not on file  Occupational History  . Occupation: Musician  Tobacco Use  . Smoking status: Former Smoker    Packs/day: 1.00    Years: 25.00    Pack years: 25.00    Types: Cigarettes  . Smokeless tobacco: Never Used  Vaping Use  . Vaping Use: Never used  Substance and Sexual Activity  . Alcohol use: No    Alcohol/week: 0.0 standard drinks  . Drug use: No  . Sexual activity: Not on file  Other Topics Concern  . 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   Social Determinants of Health   Financial Resource  Strain: Not on file  Food Insecurity: No Food Insecurity  . Worried About Charity fundraiser in the Last Year: Never true  . Ran Out of Food in the Last Year: Never true  Transportation Needs: No Transportation Needs  . Lack of Transportation (Medical): No  . Lack of Transportation (Non-Medical): No  Physical Activity: Not on file  Stress: Not on file  Social Connections: Not on file  Intimate Partner Violence: Not on file    Review of Systems: Review of Systems  Constitutional: Negative for chills, fever and weight loss.  HENT: Negative for hearing loss and tinnitus.   Eyes: Negative for pain and redness.  Respiratory: Negative for cough and shortness of breath.   Cardiovascular: Negative for chest pain and palpitations.  Gastrointestinal: Positive for blood in stool. Negative for abdominal pain, constipation, diarrhea, heartburn, melena, nausea and vomiting.  Genitourinary: Negative for flank pain and hematuria.  Musculoskeletal: Negative for falls and joint pain.  Skin: Negative for itching and rash.  Neurological: Negative for seizures and loss of consciousness.  Endo/Heme/Allergies: Negative for polydipsia. Does not bruise/bleed easily.  Psychiatric/Behavioral: Negative for substance abuse. The patient is not nervous/anxious.     Physical Exam: Vital signs: Vitals:   05/31/20 1200 05/31/20 1230  BP: 115/84 100/81  Pulse: 81 76  Resp: 15 18  Temp:    SpO2: 100% 100%     Physical Exam Constitutional:      General: He is not in acute distress. HENT:     Head: Normocephalic and atraumatic.     Nose: No congestion.     Mouth/Throat:     Mouth: Mucous membranes are moist.     Pharynx: Oropharynx is clear.  Eyes:     Extraocular Movements: Extraocular movements intact.     Conjunctiva/sclera: Conjunctivae normal.  Cardiovascular:     Rate and Rhythm: Normal rate and regular rhythm.     Pulses: Normal pulses.  Pulmonary:     Effort: Pulmonary effort is normal. No  respiratory distress.  Abdominal:     General: Bowel sounds are normal. There is no distension.     Palpations: Abdomen is soft. There is no mass.     Tenderness: There is no abdominal tenderness. There is no guarding or rebound.     Hernia: No hernia is present.  Musculoskeletal:        General: No swelling or tenderness.     Cervical  back: Normal range of motion and neck supple.  Skin:    General: Skin is warm and dry.  Neurological:     General: No focal deficit present.     Mental Status: He is alert and oriented to person, place, and time.  Psychiatric:        Mood and Affect: Mood normal.        Behavior: Behavior normal.     GI:  Lab Results: Recent Labs    05/29/20 0248 05/31/20 0523  WBC 7.9 12.2*  HGB 9.2* 9.9*  HCT 27.7* 30.1*  PLT 74* PLATELET CLUMPS NOTED ON SMEAR, UNABLE TO ESTIMATE   BMET Recent Labs    05/31/20 0523  NA 138  K 3.2*  CL 101  CO2 26  GLUCOSE 136*  BUN 15  CREATININE 1.37*  CALCIUM 9.4   LFT Recent Labs    05/31/20 0523  PROT 6.4*  ALBUMIN 3.5  AST 39  ALT 35  ALKPHOS 43  BILITOT 0.6   PT/INR Recent Labs    05/31/20 0523  LABPROT 13.4  INR 1.0     Studies/Results: CT Angio Abd/Pel W and/or Wo Contrast  Result Date: 05/31/2020 CLINICAL DATA:  Rectal bleeding EXAM: CTA ABDOMEN AND PELVIS WITHOUT AND WITH CONTRAST TECHNIQUE: Multidetector CT imaging of the abdomen and pelvis was performed using the standard protocol during bolus administration of intravenous contrast. Multiplanar reconstructed images and MIPs were obtained and reviewed to evaluate the vascular anatomy. CONTRAST:  141mL OMNIPAQUE IOHEXOL 350 MG/ML SOLN COMPARISON:  Recent CT a abdomen/pelvis 05/25/2020 FINDINGS: VASCULAR Aorta: Stable fusiform aneurysmal dilation of the infrarenal abdominal aorta with a maximal diameter of 4.8 cm. No change compared to recent prior imaging. Mild calcified atherosclerotic plaque. Wall adherent mural thrombus within the  aneurysmal segment. Celiac: Patent without evidence of aneurysm, dissection, vasculitis or significant stenosis. Lateral segmental branch of the left hepatic artery is replaced to the left gastric artery. SMA: Patent without evidence of aneurysm, dissection, vasculitis or significant stenosis. Renals: Both main renal arteries are patent without evidence of aneurysm, dissection, vasculitis, fibromuscular dysplasia or significant stenosis. Small accessory artery to the left lower pole. IMA: Patent without evidence of aneurysm, dissection, vasculitis or significant stenosis. 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 Chronic non flow limiting dissection flap also present along the right common iliac artery secondary to a penetrating atherosclerotic ulcer. No aneurysmal dilation. 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 Aneurysmal dilation of the right internal iliac artery up to 3.0 cm, unchanged. The external iliac arteries are relatively spared from disease. Proximal Outflow: Bilateral common femoral and visualized portions of the superficial and profunda femoral arteries are patent without evidence of aneurysm, dissection, vasculitis or significant stenosis. Veins: No focal venous abnormality. Review of the MIP images confirms the above findings. NON-VASCULAR Lower chest: No acute abnormality. Hepatobiliary: Large gallstone without evidence of gallbladder wall thickening or inflammation. Normal hepatic contour morphology. Stable circumscribed low-attenuation lesions consistent with small cysts or biliary hamartomas. No solid lesion identified. No biliary ductal dilatation. Pancreas: Unremarkable. No pancreatic ductal dilatation or surrounding inflammatory changes. Spleen: Normal in size without focal abnormality. Adrenals/Urinary Tract: Normal adrenal glands. Stable bilateral  renal cysts and parapelvic renal sinus cysts of varying complexity without interval change compared to recent prior imaging. Stomach/Bowel: Colonic diverticular disease without CT evidence of active inflammation. No evidence of obstruction  or focal bowel wall thickening. Normal appendix in the right lower quadrant. The terminal ileum is unremarkable. Lymphatic: No suspicious lymphadenopathy. Reproductive: Prostate is unremarkable. Other: Fat containing umbilical hernia. No inguinal hernia. No ascites. Musculoskeletal: No acute fracture or aggressive appearing lytic or blastic osseous lesion. Multilevel degenerative disc disease. IMPRESSION: VASCULAR 1. No evidence of active gastrointestinal bleeding at this time. 2. Stable infrarenal abdominal aortic, left common iliac, left anterior division internal iliac and right internal iliac artery aneurysms without change compared to recent prior imaging. Recommend follow-up every 6 months and vascular consultation. This recommendation follows ACR consensus guidelines: White Paper of the ACR Incidental Findings Committee II on Vascular Findings. J Am Coll Radiol 2013; 10:789-794. NON-VASCULAR 1. No acute abnormality within the abdomen or pelvis. 2. Colonic diverticular disease without CT evidence of active inflammation. 3. Additional ancillary findings as above without significant interval change. Aortic Atherosclerosis (ICD10-I70.0); Aortic aneurysm NOS (ICD10-I71.9). Electronically Signed   By: Jacqulynn Cadet M.D.   On: 05/31/2020 09:36    Impression: Rectal bleeding, most likely diverticular bleeding. CT-A today negative for active bleeding.  However, due to [facility] powder use and large duodenal diverticulum, cannot rule out brisk upper GI bleeding. -Hgb 9.9, stable -BUN 15/ Cr 1.37 -None diverticulosis -BC powder (aspirin containing) twice daily  Plan: EGD and colonoscopy tomorrow with Dr. Therisa Doyne.  Clear liquid diet with Nulytely prep today.  Continue to  monitor H&H with transfusion as needed to maintain hemoglobin greater than 7.  If persistent or destabilizing bleeding, proceed with STAT nuclear bleeding scan (followed by IR consultation if positive).  [facility] will follow.   LOS: 0 days   Salley Slaughter  PA-C 05/31/2020, 1:16 PM  Contact #  [phone]

## 2020-05-31 NOTE — Progress Notes (Signed)
Patient refused the use of CPAP for the evening.  

## 2020-05-31 NOTE — ED Notes (Signed)
Patient states he was discharged from the hospital 2 days ago for same for rectal bleeding, states he had multiple test however wasn't able to find source of bleeding, states it just stopped on its own. C/o 2 episodes of rectal bleeding in the last hour. Denies abd. Pain

## 2020-05-31 NOTE — ED Notes (Signed)
States he is starting to feel al little better, wife at bedside.

## 2020-05-31 NOTE — ED Notes (Signed)
First unit of O pos emergency release blood given per order Dr. Laverta Baltimore #[id number]

## 2020-05-31 NOTE — H&P (Signed)
History and Physical    CADENCE HASLAM CHY:[id number] DOB: [birth year] DOA: 05/31/2020  PCP: [name], MD Consultants:  [place] - cardiology; [place]; [name] - hematology; [place] - urology; [facility] Patient coming from:  Home - lives with wife, son and his wife, grandson; NOK: Wife, [phone]  Chief Complaint: BRBPR  HPI: Lonnie Hansen is a 70 y.o. male with medical history significant of OSA on CPAP; HTN; HLD; CAD; chronic ITP; AAA; and GI bleeding with admission from 4/29-5/4 due to hemorrhagia shock thought to be due to diverticular bleeding presenting with recurrent BRBPR.  Symptoms started 4/29 with rectal hemorrhage "which is not uncommon for him" per his wife - previously dark and tarry stools but this time they were bright red.  He had EGD and flex sig without an answer.  Bleeding stopped for 2 days prior to d/c.  He got home 5/4 PM and was able to eat without difficulty.  Bleeding started again about 4 AM - he awoke with a stabbing pain in the base of his stomach.  He called his wife, who was asleep.  He had to have a BM as soon as he woke up and "blood just poured out of me."  He had had a normal BM earlier in the day.  He has never been on [facility], does not take Goody powders anymore (was taking multiple powders daily but none recently).  He had 2 bloody stools at home and one huge BM in the ER.  Last bloody BM was shortly after arrival.  He is pretty sure he has right-sided rib fractures from CPR during last hospitalization.    ED Course:  Carryover, per Dr. [place] Crane:  Lonnie Hansen is a 70 y.o. male with past medical history of GI bleeding, suspected diverticular bleed, presents to the emergency department with bright red blood per rectum which began this evening. He has a cramping lower abdominal discomfort just before passing blood. He is not anticoagulated. He had a hospitalization recently with suspected diverticular bleed but no active hemorrhage found. Returns  with the same. 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 Receiving 1 unit. Holding off on 2nd unit due to the antibodies.   EDP contacted IR but not GI  Review of Systems: As per HPI; otherwise review of systems reviewed and negative.   Ambulatory Status:  Ambulates without assistance  COVID Vaccine Status:   Complete  Past Medical History:  Diagnosis Date  . AAA (abdominal aortic aneurysm) ([place])   . Anemia   . Arthritis   . Asymptomatic bilateral carotid artery stenosis 08/2015   1-39%   . Cataracts, bilateral   . 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 Coronary artery disease   . Diverticulosis   . Enlarged aorta ([place])   . Enlarged prostate    slightly  . GERD (gastroesophageal reflux disease)    takes Pantoprazole daily as needed  . Glaucoma    uses eye drops daily  . Headache   . History of colon polyps    benign  . History of kidney stones   . Hyperlipidemia    no on any meds  . Hypertension    takes Amlodipine and Atenolol daily  . OSA on CPAP   . Vocal cord nodule    pt. states  it's a" growth on vocal cord"    Past Surgical History:  Procedure Laterality Date  . AORTIC ARCH ANGIOGRAPHY N/A 04/16/2016   Procedure: Aortic Arch Angiography;  Surgeon: [name] Martinique, MD;  Location: [place] [facility];  Service: Cardiovascular;  Laterality: N/A;  . AORTIC VALVE REPLACEMENT N/A 09/15/2016   Procedure: AORTIC VALVE REPLACEMENT (AVR);  Surgeon: Grace Isaac, MD;  Location: [place];  Service: Open Heart Surgery;  Laterality: N/A;  Using 63mm Edwards Perimount Magna Ease Aortic Bioprosthesis Valve  . ASCENDING AORTIC ROOT REPLACEMENT N/A 09/15/2016   Procedure: ASCENDING AORTIC ROOT REPLACEMENT;  Surgeon: Grace Isaac, MD;  Location: [place];  Service: Open Heart Surgery;  Laterality: N/A;  Using 21mm Gelweave Valsalva Graft  . COLONOSCOPY    . COLONOSCOPY Left 04/16/2019   Procedure: COLONOSCOPY;  Surgeon: Arta Silence, MD;  Location[facility]  ENDOSCOPY;  Service: Endoscopy;  Laterality: Left;  . COLONOSCOPY WITH ESOPHAGOGASTRODUODENOSCOPY (EGD)    . ESOPHAGOGASTRODUODENOSCOPY N/A 05/25/2020   Procedure: ESOPHAGOGASTRODUODENOSCOPY (EGD);  Surgeon: Wilford Corner, MD;  Location: [place];  Service: Endoscopy;  Laterality: N/A;  . ESOPHAGOGASTRODUODENOSCOPY (EGD) WITH PROPOFOL Left 04/16/2019   Procedure: ESOPHAGOGASTRODUODENOSCOPY (EGD) WITH PROPOFOL;  Surgeon: Arta Silence, MD;  Location: [facility] ENDOSCOPY;  Service: Endoscopy;  Laterality: Left;  . FLEXIBLE SIGMOIDOSCOPY N/A 05/25/2020   Procedure: FLEXIBLE SIGMOIDOSCOPY;  Surgeon: Wilford Corner, MD;  Location: [place];  Service: Endoscopy;  Laterality: N/A;  . IR THORACENTESIS ASP PLEURAL SPACE W/IMG GUIDE  10/23/2016  . MICROLARYNGOSCOPY Right 09/20/2017   Procedure: MICROLARYNGOSCOPY WITH  EXCISION OF VOCAL CORD LESION;  Surgeon: Leta Baptist, MD;  Location: [place];  Service: ENT;  Laterality: Right;  . MULTIPLE EXTRACTIONS WITH ALVEOLOPLASTY N/A 06/10/2016   Procedure: Extraction of tooth #'s 2,8,13,15, and 29  with alveoloplasty, maxillary right and left buccal exostoses reductions, and gross debridement of remaining teeth.;  Surgeon: Lenn Cal, DDS;  Location: [place];  Service: Oral Surgery;  Laterality: N/A;  . RIGHT/LEFT HEART CATH AND CORONARY ANGIOGRAPHY N/A 04/16/2016   Procedure: Right/Left Heart Cath and Coronary Angiography;  Surgeon: [name] Martinique, MD;  Location: [place] [facility];  Service: Cardiovascular;  Laterality: N/A;  . TEE WITHOUT CARDIOVERSION N/A 09/15/2016   Procedure: TRANSESOPHAGEAL ECHOCARDIOGRAM (TEE);  Surgeon: Grace Isaac, MD;  Location: [place];  Service: Open Heart Surgery;  Laterality: N/A;    Social History   Socioeconomic History  . Marital status: Married    Spouse name: Not on file  . Number of children: 1  . Years of education: Not on file  . Highest education level: Not on file  Occupational History   . Occupation: Musician  Tobacco Use  . Smoking status: Former Smoker    Packs/day: 1.00    Years: 25.00    Pack years: 25.00    Types: Cigarettes  . Smokeless tobacco: Never Used  Vaping Use  . Vaping Use: Never used  Substance and Sexual Activity  . Alcohol use: No    Alcohol/week: 0.0 standard drinks  . Drug use: No  . Sexual activity: Not on file  Other Topics Concern  . 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   Social Determinants of Health   Financial Resource Strain: Not on file  Food Insecurity: No Food Insecurity  . Worried About Charity fundraiser in the Last Year: Never true  . Ran Out of Food in the Last Year: Never true  Transportation Needs: No Transportation Needs  . Lack of Transportation (Medical): No  . Lack of Transportation (Non-Medical):  No  Physical Activity: Not on file  Stress: Not on file  Social Connections: Not on file  Intimate Partner Violence: Not on file    No Known Allergies  Family History  Problem Relation Age of Onset  . Heart disease Father   . Heart attack Father   . Thyroid disease Sister     Prior to Admission medications   Medication Sig Start Date End Date Taking? Authorizing Provider  acetaminophen (TYLENOL) 325 MG tablet Take 2 tablets (650 mg total) by mouth every 6 (six) hours as needed for mild pain or fever. 05/29/20   Estill Cotta, NP  amLODipine (NORVASC) 10 MG tablet Take 10 mg by mouth daily at 12 noon.    [provider]  atenolol (TENORMIN) 25 MG tablet Take 0.5 tablets (12.5 mg total) by mouth 2 (two) times daily. 09/23/16   Gold, Patrick Jupiter E, PA-C  Ensure Plus (ENSURE PLUS) LIQD Take 237 mLs by mouth daily.    [provider]  ferrous sulfate 325 (65 FE) MG tablet Take 325 mg by mouth daily at 12 noon.    [provider]  fluticasone (FLONASE) 50 MCG/ACT nasal spray Place 1 spray into both nostrils daily as needed for allergies or  rhinitis. 04/16/20   [provider]  montelukast (SINGULAIR) 10 MG tablet Take 10 mg by mouth daily at 12 noon. 05/08/20   [provider]  Multiple Vitamin (MULTIVITAMIN WITH MINERALS) TABS tablet Take 1 tablet by mouth daily at 12 noon.    [provider]  omega-3 acid ethyl esters (LOVAZA) 1 g capsule Take 2 capsules (2 g total) by mouth 2 (two) times daily. 05/12/19   Deberah Pelton, NP  pantoprazole (PROTONIX) 40 MG tablet Take 40 mg by mouth daily at 12 noon. 03/11/19   [provider]  rosuvastatin (CRESTOR) 10 MG tablet Take 1 tablet by mouth once daily Patient taking differently: Take 10 mg by mouth daily at 12 noon. 05/17/20   Martinique, [name], MD  triamcinolone cream (KENALOG) 0.1 % Apply 1 application topically 2 (two) times daily as needed (facial itching/rash). 05/16/20   [provider]    Physical Exam: Vitals:   05/31/20 1130 05/31/20 1200 05/31/20 1230 05/31/20 1300  BP: 113/74 115/84 100/81 109/75  Pulse: 75 81 76 75  Resp: 15 15 18  (!) 22  Temp:      TempSrc:      SpO2: 100% 100% 100% 99%  Weight:      Height:         . General:  Appears calm and comfortable and is in NAD . Eyes:  PERRL, EOMI, normal lids, iris . ENT:  grossly normal hearing, lips & tongue, mmm; appropriate dentition . Neck:  no LAD, masses or thyromegaly . Cardiovascular:  RRR, no m/r/g. No LE edema.  Marland Kitchen Respiratory:   CTA bilaterally with no wheezes/rales/rhonchi.  Normal respiratory effort. . Abdomen:  soft, mildly TTP along B LQs, ND . Skin:  no rash or induration seen on limited exam . Musculoskeletal:  grossly normal tone BUE/BLE, good ROM, no bony abnormality . Psychiatric:  grossly normal mood and affect, speech fluent and appropriate, AOx3 . Neurologic:  CN 2-12 grossly intact, moves all extremities in coordinated fashion    Radiological Exams on Admission: Independently reviewed - see discussion in A/P where applicable  CT Angio Abd/Pel W  and/or Wo Contrast  Result Date: 05/31/2020 CLINICAL DATA:  Rectal bleeding EXAM: CTA ABDOMEN AND PELVIS  WITHOUT AND WITH CONTRAST TECHNIQUE: Multidetector CT imaging of the abdomen and pelvis was performed using the standard protocol during bolus administration of intravenous contrast. Multiplanar reconstructed images and MIPs were obtained and reviewed to evaluate the vascular anatomy. CONTRAST:  129mL OMNIPAQUE IOHEXOL 350 MG/ML SOLN COMPARISON:  Recent CT a abdomen/pelvis 05/25/2020 FINDINGS: VASCULAR Aorta: Stable fusiform aneurysmal dilation of the infrarenal abdominal aorta with a maximal diameter of 4.8 cm. No change compared to recent prior imaging. Mild calcified atherosclerotic plaque. Wall adherent mural thrombus within the aneurysmal segment. Celiac: Patent without evidence of aneurysm, dissection, vasculitis or significant stenosis. Lateral segmental branch of the left hepatic artery is replaced to the left gastric artery. SMA: Patent without evidence of aneurysm, dissection, vasculitis or significant stenosis. Renals: Both main renal arteries are patent without evidence of aneurysm, dissection, vasculitis, fibromuscular dysplasia or significant stenosis. Small accessory artery to the left lower pole. IMA: Patent without evidence of aneurysm, dissection, vasculitis or significant stenosis. 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 Chronic non flow limiting dissection flap also present along the right common iliac artery secondary to a penetrating atherosclerotic ulcer. No aneurysmal dilation. 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 Aneurysmal dilation of the right internal iliac artery up to 3.0 cm, unchanged. The external iliac arteries are relatively spared from disease. Proximal Outflow: Bilateral common femoral and visualized portions of the superficial and  profunda femoral arteries are patent without evidence of aneurysm, dissection, vasculitis or significant stenosis. Veins: No focal venous abnormality. Review of the MIP images confirms the above findings. NON-VASCULAR Lower chest: No acute abnormality. Hepatobiliary: Large gallstone without evidence of gallbladder wall thickening or inflammation. Normal hepatic contour morphology. Stable circumscribed low-attenuation lesions consistent with small cysts or biliary hamartomas. No solid lesion identified. No biliary ductal dilatation. Pancreas: Unremarkable. No pancreatic ductal dilatation or surrounding inflammatory changes. Spleen: Normal in size without focal abnormality. Adrenals/Urinary Tract: Normal adrenal glands. Stable bilateral renal cysts and parapelvic renal sinus cysts of varying complexity without interval change compared to recent prior imaging. Stomach/Bowel: Colonic diverticular disease without CT evidence of active inflammation. No evidence of obstruction or focal bowel wall thickening. Normal appendix in the right lower quadrant. The terminal ileum is unremarkable. Lymphatic: No suspicious lymphadenopathy. Reproductive: Prostate is unremarkable. Other: Fat containing umbilical hernia. No inguinal hernia. No ascites. Musculoskeletal: No acute fracture or aggressive appearing lytic or blastic osseous lesion. Multilevel degenerative disc disease. IMPRESSION: VASCULAR 1. No evidence of active gastrointestinal bleeding at this time. 2. Stable infrarenal abdominal aortic, left common iliac, left anterior division internal iliac and right internal iliac artery aneurysms without change compared to recent prior imaging. Recommend follow-up every 6 months and vascular consultation. This recommendation follows ACR consensus guidelines: White Paper of the ACR Incidental Findings Committee II on Vascular Findings. J Am Coll Radiol 2013; 10:789-794. NON-VASCULAR 1. No acute abnormality within the abdomen or  pelvis. 2. Colonic diverticular disease without CT evidence of active inflammation. 3. Additional ancillary findings as above without significant interval change. Aortic Atherosclerosis (ICD10-I70.0); Aortic aneurysm NOS (ICD10-I71.9). Electronically Signed   By: Jacqulynn Cadet M.D.   On: 05/31/2020 09:36    EKG: Independently reviewed.  NSR with rate 85; PVCs; IVCD; nonspecific ST changes with no evidence of acute ischemia   Labs on Admission: I have personally reviewed the available labs and imaging studies at the time of the  admission.  Pertinent labs:   K+ 3.2 Glucose 136 BUN 15/Creatinine 1.37/GFR 55 WBC 12.2 Hgb 9.9 Platelets clumped INR 1.0 Heme positive   Assessment/Plan Principal Problem:   GI bleed Active Problems:   Thoracic aortic aneurysm (HCC)   HTN (hypertension)   Hyperlipidemia   OSA on CPAP   Chronic ITP (idiopathic thrombocytopenia) ([facility]CC)    Lower GI bleeding -Patient with recent hospitalization for hemorrhagic shock associated with likely diverticular bleeding; despite EGD/colo/tagged RBC scan/CTA, no source was identified -Bleeding recurred this AM - acute onset with heavy bleeding but appears to have qwelled -Once again, the most likely etiology is diverticular bleeding -He is afebrile at this time without tachycardia, and only mild leukocytosis; will not give antibiotics at this time.  -Will admit to progressive care unit given the severity of his prior bleeding event -Continue to monitor for recurrent bleeding -CTA is the best radiologic test for localization of GI bleeding, detecting bleeding of 0.3-0.5cc/min - unfortunately, this was done and was negative so an IR embolization source cannot currently be identified -He only had flex sig last time so would recommend full colonoscopy +/- repeat EGD -GI consult has been requested -If negative EGD/colonoscopy, small bowel bleeding may be suspected and capsule endoscopy is the most reasonable 1st step  for further evaluation.  ABLA -Patient's hypotension on presentation was likely secondary to acute lower GI bleeding.  -Type and screen were done in ED.  -One unit of blood was ordered by ED.  -Monitor closely and follow cbc q12h, transfuse as necessary for Hbg <7.  OSA on CPAP -Continue home CPAP  CAD/HTN/Thoracic and AAA -Atenolol resumed during prior hospitalization, but he has had bradycardia so will stop for now -Resume Norvasc tomorrow -Resume Crestor tomorrow -Needs q20m imaging and vascular consultation for multiple aneurysms  Gallstone -Large gallstone seen on CT -Unrelated to current presentation  Chronic ITP -This may be a contributing factor to recurrent bleeding -Platelets were clumped on presentation and platelet count requested but is pending -May need hematology input while hospitalized; will notify [name] of admission via Secure Chat       Note: This patient has been tested and is pending for the novel coronavirus COVID-19. He has been fully vaccinated against COVID-19.    DVT prophylaxis: SCDs Code Status:  Full - confirmed with patient/family Family Communication: Wife was present throughout evaluation  Disposition Plan:  The patient is from: home  Anticipated d/c is to: home without [facility] services   Anticipated d/c date will depend on clinical response to treatment, likely 2-3 days  Patient is currently: acutely ill Consults called: GI; Heme/onc via Secure Chat Admission status: Admit - It is my clinical opinion that admission to INPATIENT is reasonable and necessary because of the expectation that this patient will require hospital care that crosses at least 2 midnights to treat this condition based on the medical complexity of the problems presented.  Given the aforementioned information, the predictability of an adverse outcome is felt to be significant.     Karmen Bongo MD [facility]   How to contact the [facility] Attending or  Consulting provider [place] or covering provider during after hours [place], for this patient?  1. Check the care team in [facility] and look for a) attending/consulting [facility] provider listed and b) the [facility] team listed 2. Log into [email or web address] and use [facility]'s universal password to access. If you do not have the password, please contact the hospital operator. 3. Locate the  [facility] provider you are looking for under [facility] and page to a number that you can be directly reached. 4. If you still have difficulty reaching the provider, please page the [facility] (Director on Call) for the Hospitalists listed on amion for assistance.   05/31/2020, 1:25 PM

## 2020-05-31 NOTE — ED Notes (Signed)
Patient was given a cup of Cranberry and Apple Juice.

## 2020-05-31 NOTE — ED Notes (Signed)
Attempted to given report no one available per Sec.

## 2020-05-31 NOTE — ED Notes (Signed)
Returned from CT.

## 2020-05-31 NOTE — ED Triage Notes (Signed)
Bright red rectal bleeding today x 2 episodes, also reports of shortness of breath. Denies any chest pain.

## 2020-05-31 NOTE — ED Notes (Signed)
Patient had large bowel movement, mostly bright red with some clots. Patient cleaned and pamper changed.

## 2020-05-31 NOTE — Consult Note (Signed)
Referring Provider: Dr. Karmen Bongo [facility]) Primary Care Physician:  [name], MD Primary Gastroenterologist:  Dr. Watt Climes [facility])  Reason for Consultation: Rectal bleeding  HPI: Lonnie Hansen is a 70 y.o. male OSA on CPAP, HTN, CAD, chronic ITP, AAA, and GI bleeding with admission from 4/29-5/4 due to hemorrhagia shock presenting with recurrent rectal bleeding.  Patient states he started experiencing large-volume hematochezia this morning and thus presented to the ED.  He denies any abdominal pain, though does have some abdominal cramping when he passes bloody stool.  He denies any melena.  Denies changes in appetite, unexplained weight loss, GERD, dysphagia.  He does note that he has been taking 2 BC powders daily due to hip pain.  Denies blood thinner use.  He was recently hospitalized from 4/29 to 05/29/2020 for hemorrhagic shock due to presumed diverticular bleeding.    He underwent flex sig on 05/25/2020 in the early morning hours which revealed blood in the rectum, in the recto-sigmoid colon, in the sigmoid colon and in the descending colon.  Diverticulosis in the sigmoid colon.  He underwent EGD which revealed gastritis and nonbleeding duodenal diverticulum.  He then had nuclear bleeding scan on 05/25/2020 which was suspicious for GI bleeding from distal rectum.  CT-A to follow did not show evidence of active bleeding, though did showed extensive diverticulosis of descending and sigmoid colon.    Past Medical History:  Diagnosis Date  . AAA (abdominal aortic aneurysm) ([place])   . Anemia   . Arthritis   . Asymptomatic bilateral carotid artery stenosis 08/2015   1-39%   . Cataracts, bilateral   . 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 Coronary artery disease   . Diverticulosis   . Enlarged aorta ([place])   . Enlarged prostate    slightly  . GERD (gastroesophageal reflux disease)    takes Pantoprazole daily as needed  . Glaucoma    uses eye drops daily  .  Headache   . History of colon polyps    benign  . History of kidney stones   . Hyperlipidemia    no on any meds  . Hypertension    takes Amlodipine and Atenolol daily  . OSA on CPAP   . Vocal cord nodule    pt. states  it's a" growth on vocal cord"    Past Surgical History:  Procedure Laterality Date  . AORTIC ARCH ANGIOGRAPHY N/A 04/16/2016   Procedure: Aortic Arch Angiography;  Surgeon: [name] Martinique, MD;  Location: [place] [facility];  Service: Cardiovascular;  Laterality: N/A;  . AORTIC VALVE REPLACEMENT N/A 09/15/2016   Procedure: AORTIC VALVE REPLACEMENT (AVR);  Surgeon: Grace Isaac, MD;  Location: [place];  Service: Open Heart Surgery;  Laterality: N/A;  Using 65mm Edwards Perimount Magna Ease Aortic Bioprosthesis Valve  . ASCENDING AORTIC ROOT REPLACEMENT N/A 09/15/2016   Procedure: ASCENDING AORTIC ROOT REPLACEMENT;  Surgeon: Grace Isaac, MD;  Location: [place];  Service: Open Heart Surgery;  Laterality: N/A;  Using 25mm Gelweave Valsalva Graft  . COLONOSCOPY    . COLONOSCOPY Left 04/16/2019   Procedure: COLONOSCOPY;  Surgeon: Arta Silence, MD;  Location: [facility] ENDOSCOPY;  Service: Endoscopy;  Laterality: Left;  . COLONOSCOPY WITH ESOPHAGOGASTRODUODENOSCOPY (EGD)    . ESOPHAGOGASTRODUODENOSCOPY N/A 05/25/2020   Procedure: ESOPHAGOGASTRODUODENOSCOPY (EGD);  Surgeon: Wilford Corner, MD;  Location: [place];  Service: Endoscopy;  Laterality: N/A;  . ESOPHAGOGASTRODUODENOSCOPY (EGD) WITH PROPOFOL Left 04/16/2019   Procedure: ESOPHAGOGASTRODUODENOSCOPY (EGD) WITH PROPOFOL;  Surgeon: Arta Silence, MD;  Location: [facility] ENDOSCOPY;  Service: Endoscopy;  Laterality: Left;  . FLEXIBLE SIGMOIDOSCOPY N/A 05/25/2020   Procedure: FLEXIBLE SIGMOIDOSCOPY;  Surgeon: Wilford Corner, MD;  Location: [place];  Service: Endoscopy;  Laterality: N/A;  . IR THORACENTESIS ASP PLEURAL SPACE W/IMG GUIDE  10/23/2016  . MICROLARYNGOSCOPY Right 09/20/2017   Procedure: MICROLARYNGOSCOPY  WITH  EXCISION OF VOCAL CORD LESION;  Surgeon: Leta Baptist, MD;  Location: [place];  Service: ENT;  Laterality: Right;  . MULTIPLE EXTRACTIONS WITH ALVEOLOPLASTY N/A 06/10/2016   Procedure: Extraction of tooth #'s 2,8,13,15, and 29  with alveoloplasty, maxillary right and left buccal exostoses reductions, and gross debridement of remaining teeth.;  Surgeon: Lenn Cal, DDS;  Location: [place];  Service: Oral Surgery;  Laterality: N/A;  . RIGHT/LEFT HEART CATH AND CORONARY ANGIOGRAPHY N/A 04/16/2016   Procedure: Right/Left Heart Cath and Coronary Angiography;  Surgeon: [name] Martinique, MD;  Location: [place] [facility];  Service: Cardiovascular;  Laterality: N/A;  . TEE WITHOUT CARDIOVERSION N/A 09/15/2016   Procedure: TRANSESOPHAGEAL ECHOCARDIOGRAM (TEE);  Surgeon: Grace Isaac, MD;  Location: [place];  Service: Open Heart Surgery;  Laterality: N/A;    Prior to Admission medications   Medication Sig Start Date End Date Taking? Authorizing Provider  acetaminophen (TYLENOL) 325 MG tablet Take 2 tablets (650 mg total) by mouth every 6 (six) hours as needed for mild pain or fever. 05/29/20   Estill Cotta, NP  amLODipine (NORVASC) 10 MG tablet Take 10 mg by mouth daily at 12 noon.    [provider]  Ensure Plus (ENSURE PLUS) LIQD Take 237 mLs by mouth daily.    [provider]  ferrous sulfate 325 (65 FE) MG tablet Take 325 mg by mouth daily at 12 noon.    [provider]  fluticasone (FLONASE) 50 MCG/ACT nasal spray Place 1 spray into both nostrils daily as needed for allergies or rhinitis. 04/16/20   [provider]  montelukast (SINGULAIR) 10 MG tablet Take 10 mg by mouth daily at 12 noon. 05/08/20   [provider]  Multiple Vitamin (MULTIVITAMIN WITH MINERALS) TABS tablet Take 1 tablet by mouth daily at 12 noon.    [provider]  omega-3 acid ethyl esters (LOVAZA) 1 g capsule Take 2 capsules (2 g total) by mouth 2 (two)  times daily. 05/12/19   Deberah Pelton, NP  pantoprazole (PROTONIX) 40 MG tablet Take 40 mg by mouth daily at 12 noon. 03/11/19   [provider]  rosuvastatin (CRESTOR) 10 MG tablet Take 1 tablet by mouth once daily Patient taking differently: Take 10 mg by mouth daily at 12 noon. 05/17/20   Martinique, [name], MD  triamcinolone cream (KENALOG) 0.1 % Apply 1 application topically 2 (two) times daily as needed (facial itching/rash). 05/16/20   [provider]    Scheduled Meds: . [START ON 06/01/2020] amLODipine  10 mg Oral Q1200  . montelukast  10 mg Oral Q1200  . pantoprazole (PROTONIX) IV  40 mg Intravenous Q12H  . polyethylene glycol-electrolytes  4,000 mL Oral Once  . [START ON 06/01/2020] rosuvastatin  10 mg Oral Q1200  . sodium chloride flush  3 mL Intravenous Q12H   Continuous Infusions: . lactated ringers     PRN Meds:.acetaminophen **OR** acetaminophen, hydrALAZINE, morphine injection, ondansetron **OR** ondansetron (ZOFRAN) IV  Allergies as of 05/31/2020  . (No Known Allergies)    Family History  Problem Relation Age of Onset  .  Heart disease Father   . Heart attack Father   . Thyroid disease Sister     Social History   Socioeconomic History  . Marital status: Married    Spouse name: Not on file  . Number of children: 1  . Years of education: Not on file  . Highest education level: Not on file  Occupational History  . Occupation: Musician  Tobacco Use  . Smoking status: Former Smoker    Packs/day: 1.00    Years: 25.00    Pack years: 25.00    Types: Cigarettes  . Smokeless tobacco: Never Used  Vaping Use  . Vaping Use: Never used  Substance and Sexual Activity  . Alcohol use: No    Alcohol/week: 0.0 standard drinks  . Drug use: No  . Sexual activity: Not on file  Other Topics Concern  . 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   Social Determinants of Health   Financial Resource  Strain: Not on file  Food Insecurity: No Food Insecurity  . Worried About Charity fundraiser in the Last Year: Never true  . Ran Out of Food in the Last Year: Never true  Transportation Needs: No Transportation Needs  . Lack of Transportation (Medical): No  . Lack of Transportation (Non-Medical): No  Physical Activity: Not on file  Stress: Not on file  Social Connections: Not on file  Intimate Partner Violence: Not on file    Review of Systems: Review of Systems  Constitutional: Negative for chills, fever and weight loss.  HENT: Negative for hearing loss and tinnitus.   Eyes: Negative for pain and redness.  Respiratory: Negative for cough and shortness of breath.   Cardiovascular: Negative for chest pain and palpitations.  Gastrointestinal: Positive for blood in stool. Negative for abdominal pain, constipation, diarrhea, heartburn, melena, nausea and vomiting.  Genitourinary: Negative for flank pain and hematuria.  Musculoskeletal: Negative for falls and joint pain.  Skin: Negative for itching and rash.  Neurological: Negative for seizures and loss of consciousness.  Endo/Heme/Allergies: Negative for polydipsia. Does not bruise/bleed easily.  Psychiatric/Behavioral: Negative for substance abuse. The patient is not nervous/anxious.     Physical Exam: Vital signs: Vitals:   05/31/20 1200 05/31/20 1230  BP: 115/84 100/81  Pulse: 81 76  Resp: 15 18  Temp:    SpO2: 100% 100%     Physical Exam Constitutional:      General: He is not in acute distress. HENT:     Head: Normocephalic and atraumatic.     Nose: No congestion.     Mouth/Throat:     Mouth: Mucous membranes are moist.     Pharynx: Oropharynx is clear.  Eyes:     Extraocular Movements: Extraocular movements intact.     Conjunctiva/sclera: Conjunctivae normal.  Cardiovascular:     Rate and Rhythm: Normal rate and regular rhythm.     Pulses: Normal pulses.  Pulmonary:     Effort: Pulmonary effort is normal. No  respiratory distress.  Abdominal:     General: Bowel sounds are normal. There is no distension.     Palpations: Abdomen is soft. There is no mass.     Tenderness: There is no abdominal tenderness. There is no guarding or rebound.     Hernia: No hernia is present.  Musculoskeletal:        General: No swelling or tenderness.     Cervical  back: Normal range of motion and neck supple.  Skin:    General: Skin is warm and dry.  Neurological:     General: No focal deficit present.     Mental Status: He is alert and oriented to person, place, and time.  Psychiatric:        Mood and Affect: Mood normal.        Behavior: Behavior normal.     GI:  Lab Results: Recent Labs    05/29/20 0248 05/31/20 0523  WBC 7.9 12.2*  HGB 9.2* 9.9*  HCT 27.7* 30.1*  PLT 74* PLATELET CLUMPS NOTED ON SMEAR, UNABLE TO ESTIMATE   BMET Recent Labs    05/31/20 0523  NA 138  K 3.2*  CL 101  CO2 26  GLUCOSE 136*  BUN 15  CREATININE 1.37*  CALCIUM 9.4   LFT Recent Labs    05/31/20 0523  PROT 6.4*  ALBUMIN 3.5  AST 39  ALT 35  ALKPHOS 43  BILITOT 0.6   PT/INR Recent Labs    05/31/20 0523  LABPROT 13.4  INR 1.0     Studies/Results: CT Angio Abd/Pel W and/or Wo Contrast  Result Date: 05/31/2020 CLINICAL DATA:  Rectal bleeding EXAM: CTA ABDOMEN AND PELVIS WITHOUT AND WITH CONTRAST TECHNIQUE: Multidetector CT imaging of the abdomen and pelvis was performed using the standard protocol during bolus administration of intravenous contrast. Multiplanar reconstructed images and MIPs were obtained and reviewed to evaluate the vascular anatomy. CONTRAST:  165mL OMNIPAQUE IOHEXOL 350 MG/ML SOLN COMPARISON:  Recent CT a abdomen/pelvis 05/25/2020 FINDINGS: VASCULAR Aorta: Stable fusiform aneurysmal dilation of the infrarenal abdominal aorta with a maximal diameter of 4.8 cm. No change compared to recent prior imaging. Mild calcified atherosclerotic plaque. Wall adherent mural thrombus within the  aneurysmal segment. Celiac: Patent without evidence of aneurysm, dissection, vasculitis or significant stenosis. Lateral segmental branch of the left hepatic artery is replaced to the left gastric artery. SMA: Patent without evidence of aneurysm, dissection, vasculitis or significant stenosis. Renals: Both main renal arteries are patent without evidence of aneurysm, dissection, vasculitis, fibromuscular dysplasia or significant stenosis. Small accessory artery to the left lower pole. IMA: Patent without evidence of aneurysm, dissection, vasculitis or significant stenosis. 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 Chronic non flow limiting dissection flap also present along the right common iliac artery secondary to a penetrating atherosclerotic ulcer. No aneurysmal dilation. 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 Aneurysmal dilation of the right internal iliac artery up to 3.0 cm, unchanged. The external iliac arteries are relatively spared from disease. Proximal Outflow: Bilateral common femoral and visualized portions of the superficial and profunda femoral arteries are patent without evidence of aneurysm, dissection, vasculitis or significant stenosis. Veins: No focal venous abnormality. Review of the MIP images confirms the above findings. NON-VASCULAR Lower chest: No acute abnormality. Hepatobiliary: Large gallstone without evidence of gallbladder wall thickening or inflammation. Normal hepatic contour morphology. Stable circumscribed low-attenuation lesions consistent with small cysts or biliary hamartomas. No solid lesion identified. No biliary ductal dilatation. Pancreas: Unremarkable. No pancreatic ductal dilatation or surrounding inflammatory changes. Spleen: Normal in size without focal abnormality. Adrenals/Urinary Tract: Normal adrenal glands. Stable bilateral  renal cysts and parapelvic renal sinus cysts of varying complexity without interval change compared to recent prior imaging. Stomach/Bowel: Colonic diverticular disease without CT evidence of active inflammation. No evidence of obstruction  or focal bowel wall thickening. Normal appendix in the right lower quadrant. The terminal ileum is unremarkable. Lymphatic: No suspicious lymphadenopathy. Reproductive: Prostate is unremarkable. Other: Fat containing umbilical hernia. No inguinal hernia. No ascites. Musculoskeletal: No acute fracture or aggressive appearing lytic or blastic osseous lesion. Multilevel degenerative disc disease. IMPRESSION: VASCULAR 1. No evidence of active gastrointestinal bleeding at this time. 2. Stable infrarenal abdominal aortic, left common iliac, left anterior division internal iliac and right internal iliac artery aneurysms without change compared to recent prior imaging. Recommend follow-up every 6 months and vascular consultation. This recommendation follows ACR consensus guidelines: White Paper of the ACR Incidental Findings Committee II on Vascular Findings. J Am Coll Radiol 2013; 10:789-794. NON-VASCULAR 1. No acute abnormality within the abdomen or pelvis. 2. Colonic diverticular disease without CT evidence of active inflammation. 3. Additional ancillary findings as above without significant interval change. Aortic Atherosclerosis (ICD10-I70.0); Aortic aneurysm NOS (ICD10-I71.9). Electronically Signed   By: Jacqulynn Cadet M.D.   On: 05/31/2020 09:36    Impression: Rectal bleeding, most likely diverticular bleeding. CT-A today negative for active bleeding.  However, due to [facility] powder use and large duodenal diverticulum, cannot rule out brisk upper GI bleeding. -Hgb 9.9, stable -BUN 15/ Cr 1.37 -None diverticulosis -BC powder (aspirin containing) twice daily  Plan: EGD and colonoscopy tomorrow with Dr. Therisa Doyne.  Clear liquid diet with Nulytely prep today.  Continue to  monitor H&H with transfusion as needed to maintain hemoglobin greater than 7.  If persistent or destabilizing bleeding, proceed with STAT nuclear bleeding scan (followed by IR consultation if positive).  [facility] will follow.   LOS: 0 days   Salley Slaughter  PA-C 05/31/2020, 1:16 PM  Contact #  [phone]

## 2020-05-31 NOTE — ED Provider Notes (Signed)
Emergency Department Provider Note   I have reviewed the triage vital signs and the nursing notes.   HISTORY  Chief Complaint Rectal Bleeding   HPI Lonnie Hansen is a 70 y.o. male with past medical history of GI bleeding, suspected diverticular bleed, presents to the emergency department with bright red blood per rectum which began this evening.  He has a cramping lower abdominal discomfort just before passing blood.  He is not anticoagulated.  He had a hospitalization recently with suspected diverticular bleed but no active hemorrhage found.  No intervention other than resuscitation and PRBC transfusion. Denies fever, chills, nausea, vomiting.   Past Medical History:  Diagnosis Date  . AAA (abdominal aortic aneurysm) ([place])   . Anemia   . Arthritis   . Asymptomatic bilateral carotid artery stenosis 08/2015   1-39%   . Cataracts, bilateral   . 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 Coronary artery disease   . Diverticulosis   . Enlarged aorta ([place])   . Enlarged prostate    slightly  . GERD (gastroesophageal reflux disease)    takes Pantoprazole daily as needed  . Glaucoma    uses eye drops daily  . Headache   . History of colon polyps    benign  . History of kidney stones   . Hyperlipidemia    no on any meds  . Hypertension    takes Amlodipine and Atenolol daily  . OSA on CPAP   . Vocal cord nodule    pt. states  it's a" growth on vocal cord"    Patient Active Problem List   Diagnosis Date Noted  . Hemorrhagic shock ([place])   . AKI (acute kidney injury) ([place])   . Cardiac arrest ([place])   . GIB (gastrointestinal bleeding) 05/24/2020  . GI bleed 04/15/2019  . Rectal bleeding 04/14/2019  . Acute blood loss anemia 04/14/2019  . Acute GI bleeding 04/14/2019  . Unilateral primary osteoarthritis, 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  . 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 OSA on CPAP 03/04/2017  . Vocal cord nodule   . Sleep apnea    . Nocturia   . Joint swelling   . Joint pain   . Hypertension   . History of kidney stones   . History of colon polyps   . Headache   . Glaucoma   . GERD (gastroesophageal reflux disease)   . Enlarged prostate   . Enlarged aorta ([place])   . Diverticulosis   . Coronary artery disease   . Cataracts, bilateral   . Bruising   . Blood dyscrasia   . Arthritis   . Anemia   . Hydropneumothorax 10/29/2016  . S/P AVR 09/15/2016  . Coronary artery disease involving native coronary artery of native heart without angina pectoris 04/29/2016  . Aortic insufficiency 04/01/2016  . Thoracic aortic aneurysm ([place]) 09/18/2015  . HTN (hypertension) 09/18/2015  . Hyperlipidemia 09/18/2015  . Asymptomatic bilateral carotid artery stenosis 08/27/2015    Past Surgical History:  Procedure Laterality Date  . AORTIC ARCH ANGIOGRAPHY N/A 04/16/2016   Procedure: Aortic Arch Angiography;  Surgeon: [name] Martinique, MD;  Location: [facility];  Service: Cardiovascular;  Laterality: N/A;  . AORTIC VALVE REPLACEMENT N/A 09/15/2016   Procedure: AORTIC VALVE REPLACEMENT (AVR);  Surgeon: Grace Isaac, MD;  Location: [facility];  Service: Open Heart Surgery;  Laterality: N/A;  Using 42m Edwards Perimount Magna Ease Aortic Bioprosthesis Valve  . ASCENDING AORTIC ROOT REPLACEMENT N/A 09/15/2016  Procedure: ASCENDING AORTIC ROOT REPLACEMENT;  Surgeon: Grace Isaac, MD;  Location: [facility];  Service: Open Heart Surgery;  Laterality: N/A;  Using 69m Gelweave Valsalva Graft  . COLONOSCOPY    . COLONOSCOPY Left 04/16/2019   Procedure: COLONOSCOPY;  Surgeon: OArta Silence MD;  [facility];  Service: Endoscopy;  Laterality: Left;  . COLONOSCOPY WITH ESOPHAGOGASTRODUODENOSCOPY (EGD)    . ESOPHAGOGASTRODUODENOSCOPY N/A 05/25/2020   Procedure: ESOPHAGOGASTRODUODENOSCOPY (EGD);  Surgeon: SWilford Corner MD;  [facility]  Service: Endoscopy;  Laterality: N/A;  . ESOPHAGOGASTRODUODENOSCOPY  (EGD) WITH PROPOFOL Left 04/16/2019   Procedure: ESOPHAGOGASTRODUODENOSCOPY (EGD) WITH PROPOFOL;  Surgeon: OArta Silence MD;  [facility];  Service: Endoscopy;  Laterality: Left;  . FLEXIBLE SIGMOIDOSCOPY N/A 05/25/2020   Procedure: FLEXIBLE SIGMOIDOSCOPY;  Surgeon: SWilford Corner MD;  [place] [facility]  Service: Endoscopy;  Laterality: N/A;  . IR THORACENTESIS ASP PLEURAL SPACE W/IMG GUIDE  10/23/2016  . MICROLARYNGOSCOPY Right 09/20/2017   Procedure: MICROLARYNGOSCOPY WITH  EXCISION OF VOCAL CORD LESION;  Surgeon: TLeta Baptist MD;  [place] [facility]  Service: ENT;  Laterality: Right;  . MULTIPLE EXTRACTIONS WITH ALVEOLOPLASTY N/A 06/10/2016   Procedure: Extraction of tooth #'s 2,8,13,15, and 29  with alveoloplasty, maxillary right and left buccal exostoses reductions, and gross debridement of remaining teeth.;  Surgeon: KLenn Cal DDS;  [facility] [facility]  Service: Oral Surgery;  Laterality: N/A;  . RIGHT/LEFT HEART CATH AND CORONARY ANGIOGRAPHY N/A 04/16/2016   Procedure: Right/Left Heart Cath and Coronary Angiography;  Surgeon: [name] JMartinique MD;  [facility];  Service: Cardiovascular;  Laterality: N/A;  . TEE WITHOUT CARDIOVERSION N/A 09/15/2016   Procedure: TRANSESOPHAGEAL ECHOCARDIOGRAM (TEE);  Surgeon: GGrace Isaac MD;  [place] [facility]  Service: Open Heart Surgery;  Laterality: N/A;    Allergies Patient has no known allergies.  Family History  Problem Relation Age of Onset  . Heart disease Father   . Heart attack Father   . Thyroid disease Sister     Social History Social History   Tobacco Use  . Smoking status: Former Smoker    Packs/day: 1.00    Years: 25.00    Pack years: 25.00    Types: Cigarettes  . Smokeless tobacco: Never Used  Vaping Use  . Vaping Use: Never used  Substance Use Topics  . Alcohol use: No    Alcohol/week: 0.0 standard drinks  . Drug use: No    Review of  Systems  Constitutional: No fever/chills Eyes: No visual changes. ENT: No sore throat. Cardiovascular: Denies chest pain. Respiratory: Denies shortness of breath. Gastrointestinal: Cramping lower abdominal pain.  No nausea, no vomiting.  No diarrhea.  No constipation. Positive GI bleeding.  Genitourinary: Negative for dysuria. Musculoskeletal: Negative for back pain. Skin: Negative for rash. Neurological: Negative for headaches, focal weakness or numbness.  10-point ROS otherwise negative.  ____________________________________________   PHYSICAL EXAM:  VITAL SIGNS: ED Triage Vitals  Enc Vitals Group     BP 05/31/20 0453 117/76     Pulse Rate 05/31/20 0453 87     Resp 05/31/20 0453 19     Temp 05/31/20 0453 98.6 F (37 C)     Temp src --      SpO2 05/31/20 0453 100 %     Weight 05/31/20 0452 224 lb 13.9 oz (102 kg)     Height 05/31/20 0452 5' 9" (1.753 m)   Constitutional: Alert and oriented. Well appearing and in no acute  distress. Eyes: Conjunctivae are normal.  Head: Atraumatic. Nose: No congestion/rhinnorhea. Mouth/Throat: Mucous membranes are moist.  Neck: No stridor.   Cardiovascular: Normal rate, regular rhythm. Good peripheral circulation. Grossly normal heart sounds.   Respiratory: Normal respiratory effort.  No retractions. Lungs CTAB. Gastrointestinal: Soft and nontender. No distention. Rectal exam with BRBPR.  Musculoskeletal: No lower extremity tenderness nor edema. No gross deformities of extremities. Neurologic:  Normal speech and language. No gross focal neurologic deficits are appreciated.  Skin:  Skin is warm, dry and intact. No rash noted.  ____________________________________________   LABS (all labs ordered are listed, but only abnormal results are displayed)  Labs Reviewed  CBC WITH DIFFERENTIAL/PLATELET - Abnormal; Notable for the following components:      Result Value   WBC 12.2 (*)    RBC 3.28 (*)    Hemoglobin 9.9 (*)    HCT 30.1 (*)     RDW 16.8 (*)    Neutro Abs 8.8 (*)    Monocytes Absolute 1.2 (*)    Abs Immature Granulocytes 0.13 (*)    All other components within normal limits  COMPREHENSIVE METABOLIC PANEL - Abnormal; Notable for the following components:   Potassium 3.2 (*)    Glucose, Bld 136 (*)    Creatinine, Ser 1.37 (*)    Total Protein 6.4 (*)    GFR, Estimated 55 (*)    All other components within normal limits  CBC - Abnormal; Notable for the following components:   WBC 13.9 (*)    RBC 2.80 (*)    Hemoglobin 8.5 (*)    HCT 25.7 (*)    RDW 16.6 (*)    Platelets 93 (*)    nRBC 0.5 (*)    All other components within normal limits  GLUCOSE, CAPILLARY - Abnormal; Notable for the following components:   Glucose-Capillary 152 (*)    All other components within normal limits  POC OCCULT BLOOD, ED - Abnormal; Notable for the following components:   Fecal Occult Bld POSITIVE (*)    All other components within normal limits  SARS CORONAVIRUS 2 (TAT 6-24 HRS)  PROTIME-INR  CBC  BASIC METABOLIC PANEL  PLATELET BY CITRATE  VON WILLEBRAND PANEL  APTT  SAVE SMEAR (SSMR)  TYPE AND SCREEN  PREPARE RBC (CROSSMATCH)  PREPARE RBC (CROSSMATCH)   ____________________________________________  EKG   EKG Interpretation  Date/Time:  Friday May 31 2020 05:09:40 EDT Ventricular Rate:  85 PR Interval:  171 QRS Duration: 122 QT Interval:  397 QTC Calculation: 473 R Axis:   23 Text Interpretation: Sinus rhythm Multiform ventricular premature complexes Nonspecific intraventricular conduction delay Nonspecific T abnormalities, lateral leads Confirmed by Nanda Quinton [phone]) on 05/31/2020 6:05:42 AM       ____________________________________________  RADIOLOGY  NM GI Blood Loss  Result Date: 05/31/2020 CLINICAL DATA:  Persistent GI bleeding EXAM: NUCLEAR MEDICINE GASTROINTESTINAL BLEEDING SCAN TECHNIQUE: Sequential abdominal images were obtained following intravenous administration of Tc-25mlabeled red  blood cells. RADIOPHARMACEUTICALS:  Twenty-five mCi Tc-987mertechnetate in-vitro labeled red cells. COMPARISON:  05/25/2020, CT from earlier in the same day. FINDINGS: Adequate uptake is noted throughout the liver and spleen. Vascular structures are well visualized. Bladder shows normal opacification. There remains some increased activity identified in the expected region of the rectum although no mobility of this activity is seen. The overall appearance is similar to that seen on the prior exam. IMPRESSION: Persistent uptake in the region of the distal rectum. The overall appearance is similar to that seen on  the prior exam from 05/25/2020. Distal rectal bleeding could not be totally excluded. No other GI bleeding is seen. Electronically Signed   By: Inez Catalina M.D.   On: 05/31/2020 22:27   CT Angio Abd/Pel W and/or Wo Contrast  Result Date: 05/31/2020 CLINICAL DATA:  Rectal bleeding EXAM: CTA ABDOMEN AND PELVIS WITHOUT AND WITH CONTRAST TECHNIQUE: Multidetector CT imaging of the abdomen and pelvis was performed using the standard protocol during bolus administration of intravenous contrast. Multiplanar reconstructed images and MIPs were obtained and reviewed to evaluate the vascular anatomy. CONTRAST:  111m OMNIPAQUE IOHEXOL 350 MG/ML SOLN COMPARISON:  Recent CT a abdomen/pelvis 05/25/2020 FINDINGS: VASCULAR Aorta: Stable fusiform aneurysmal dilation of the infrarenal abdominal aorta with a maximal diameter of 4.8 cm. No change compared to recent prior imaging. Mild calcified atherosclerotic plaque. Wall adherent mural thrombus within the aneurysmal segment. Celiac: Patent without evidence of aneurysm, dissection, vasculitis or significant stenosis. Lateral segmental branch of the left hepatic artery is replaced to the left gastric artery. SMA: Patent without evidence of aneurysm, dissection, vasculitis or significant stenosis. Renals: Both main renal arteries are patent without evidence of aneurysm,  dissection, vasculitis, fibromuscular dysplasia or significant stenosis. Small accessory artery to the left lower pole. IMA: Patent without evidence of aneurysm, dissection, vasculitis or significant stenosis. 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 Chronic non flow limiting dissection flap also present along the right common iliac artery secondary to a penetrating atherosclerotic ulcer. No aneurysmal dilation. 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 Aneurysmal dilation of the right internal iliac artery up to 3.0 cm, unchanged. The external iliac arteries are relatively spared from disease. Proximal Outflow: Bilateral common femoral and visualized portions of the superficial and profunda femoral arteries are patent without evidence of aneurysm, dissection, vasculitis or significant stenosis. Veins: No focal venous abnormality. Review of the MIP images confirms the above findings. NON-VASCULAR Lower chest: No acute abnormality. Hepatobiliary: Large gallstone without evidence of gallbladder wall thickening or inflammation. Normal hepatic contour morphology. Stable circumscribed low-attenuation lesions consistent with small cysts or biliary hamartomas. No solid lesion identified. No biliary ductal dilatation. Pancreas: Unremarkable. No pancreatic ductal dilatation or surrounding inflammatory changes. Spleen: Normal in size without focal abnormality. Adrenals/Urinary Tract: Normal adrenal glands. Stable bilateral renal cysts and parapelvic renal sinus cysts of varying complexity without interval change compared to recent prior imaging. Stomach/Bowel: Colonic diverticular disease without CT evidence of active inflammation. No evidence of obstruction or focal bowel wall thickening. Normal appendix in the right lower quadrant. The terminal ileum is unremarkable. Lymphatic: No  suspicious lymphadenopathy. Reproductive: Prostate is unremarkable. Other: Fat containing umbilical hernia. No inguinal hernia. No ascites. Musculoskeletal: No acute fracture or aggressive appearing lytic or blastic osseous lesion. Multilevel degenerative disc disease. IMPRESSION: VASCULAR 1. No evidence of active gastrointestinal bleeding at this time. 2. Stable infrarenal abdominal aortic, left common iliac, left anterior division internal iliac and right internal iliac artery aneurysms without change compared to recent prior imaging. Recommend follow-up every 6 months and vascular consultation. This recommendation follows ACR consensus guidelines: White Paper of the ACR Incidental Findings Committee II on Vascular Findings. J Am Coll Radiol 2013; 10:789-794. NON-VASCULAR 1. No acute abnormality within the abdomen or pelvis. 2. Colonic diverticular disease without CT evidence of active inflammation. 3. Additional ancillary findings as above without significant interval change. Aortic Atherosclerosis (ICD10-I70.0); Aortic aneurysm NOS (ICD10-I71.9).  Electronically Signed   By: Jacqulynn Cadet M.D.   On: 05/31/2020 09:36    ____________________________________________   PROCEDURES  Procedure(s) performed:   Procedures  CRITICAL CARE Performed by: Margette Fast Total critical care time: 35 minutes Critical care time was exclusive of separately billable procedures and treating other patients. Critical care was necessary to treat or prevent imminent or life-threatening deterioration. Critical care was time spent personally by me on the following activities: development of treatment plan with patient and/or surrogate as well as nursing, discussions with consultants, evaluation of patient's response to treatment, examination of patient, obtaining history from patient or surrogate, ordering and performing treatments and interventions, ordering and review of laboratory studies, ordering and review of  radiographic studies, pulse oximetry and re-evaluation of patient's condition.  Nanda Quinton, MD Emergency Medicine  ____________________________________________   INITIAL IMPRESSION / ASSESSMENT AND PLAN / ED COURSE  Pertinent labs & imaging results that were available during my care of the patient were reviewed by me and considered in my medical decision making (see chart for details).   Patient presents to the emergency department with concern for GI bleeding which began again this morning.  He has some occasional cramping in the lower abdomen but mainly right before he passes blood.  Blood pressures here are good on arrival.  Patient has history of severe bleeding and actually ended up losing pulses during his last admit.   06:10 AM  Spoke with interventional radiology regarding the patient's presentation.  I do have suspicion for diverticular bleed.  He became transiently hypotensive here with increased symptoms but responding to IV fluids and emergency release PRBC.  Plan for CTA of the abdomen and pelvis.  If positive, can go for IR intervention.  Continue resuscitation.  Patient is awake and alert.   Antibodies noted on type and screen. Patient doing well w/ emergency release blood here but additional PRBC transfusions will be delayed.   Discussed patient's case with [facility] to request admission. Patient and family (if present) updated with plan. Care transferred to [facility] service.  I reviewed all nursing notes, vitals, pertinent old records, EKGs, labs, imaging (as available).  ____________________________________________  FINAL CLINICAL IMPRESSION(S) / ED DIAGNOSES  Final diagnoses:  Acute GI bleeding     MEDICATIONS GIVEN DURING THIS VISIT:  Medications  0.9 %  sodium chloride infusion (has no administration in time range)  rosuvastatin (CRESTOR) tablet 10 mg (has no administration in time range)  amLODipine (NORVASC) tablet 10 mg (has no administration in time range)   pantoprazole (PROTONIX) injection 40 mg (40 mg Intravenous Given 05/31/20 2206)  montelukast (SINGULAIR) tablet 10 mg (10 mg Oral Given 05/31/20 1450)  lactated ringers infusion ( Intravenous New Bag/Given 05/31/20 1355)  acetaminophen (TYLENOL) tablet 650 mg (has no administration in time range)    Or  acetaminophen (TYLENOL) suppository 650 mg (has no administration in time range)  morphine 2 MG/ML injection 2 mg (has no administration in time range)  ondansetron (ZOFRAN) tablet 4 mg (has no administration in time range)    Or  ondansetron (ZOFRAN) injection 4 mg (has no administration in time range)  hydrALAZINE (APRESOLINE) injection 5 mg (has no administration in time range)  sodium chloride flush (NS) 0.9 % injection 3 mL (3 mLs Intravenous Given 05/31/20 2212)  0.9 %  sodium chloride infusion (Manually program via Guardrails IV Fluids) (has no administration in time range)  sodium chloride 0.9 % bolus 1,000 mL (0 mLs Intravenous Stopped 05/31/20 0614)  iohexol (  OMNIPAQUE) 350 MG/ML injection 100 mL (100 mLs Intravenous Contrast Given 05/31/20 0900)  polyethylene glycol-electrolytes (NuLYTELY) solution 4,000 mL (4,000 mLs Oral Given 05/31/20 2309)  technetium labeled red blood cells (ULTRATAG) injection kit 25 millicurie (25 millicuries Intravenous Contrast Given 05/31/20 1944)    Note:  This document was prepared using Dragon voice recognition software and may include unintentional dictation errors.  Nanda Quinton, MD, [facility] Emergency Medicine    Long, [facility], MD 05/31/20 [phone]

## 2020-05-31 NOTE — Consult Note (Addendum)
[place]  Telephone:([phone]) [phone] [phone]    [facility]  Referring MD:  Dr. Karmen Bongo  Reason for Referral: Thrombocytopenia  HPI: Lonnie Hansen is a 70 year old male with a past medical history significant for OSA on CPAP, hypertension, hyperlipidemia, CAD, history of ITP, AAA, and GI bleeding with recent admission from 4/29 through 5/4 due to hemorrhagic shock thought to be due to diverticular bleed.  He presented to the hospital with recurrent bright red blood per rectum.  The patient's symptoms started 4/29 with rectal hemorrhage which according to his wife was not uncommon for him.  He previously had dark and tarry stools but this time they were bright red.  During his last hospitalization he underwent an EGD and flex sigmoidoscopy and cause for his bleeding was not identified.  His bleeding stopped during last hospitalization.  He again developed GI bleeding and pain in his abdomen.  He presented to the hospital for further evaluation.  CBC from today showed a WBC of 12.2, hemoglobin 9.9, platelets clumped but were 74,000 two days ago.  His INR was normal at 1.0.  Stool for occult blood positive.  The patient was previously followed by hematology for thrombocytopenia.  His last visit with [place] was on 04/19/2018.  He received Nplate for thrombocytopenia.  There was also some concern of platelet dysfunction.  A von Willebrand assay was negative.  The patient was seen in the emergency room today.  He reports that he developed recurrent GI bleeding.  Initially it was bright red blood but now reports black and tarry stools.  He also reports feeling lightheaded.  He was noted to be hypotensive during my visit with him.  He also reports feeling "warm."  Other than the GI bleeding, he has not noticed any other bleeding.  He reports that he was taking Goody powders up until about 2 weeks ago due to hip pain.  Is not having any chest pain, shortness of  breath, cough.  He reports abdominal pain but denies nausea and vomiting.  He has some scattered bruising. Hematology was asked see the patient make recommendations regarding his thrombocytopenia.  Past Medical History:  Diagnosis Date  . AAA (abdominal aortic aneurysm) ([place])   . Anemia   . Arthritis   . Asymptomatic bilateral carotid artery stenosis 08/2015   1-39%   . Cataracts, bilateral   . 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 Coronary artery disease   . Diverticulosis   . Enlarged aorta ([place])   . Enlarged prostate    slightly  . GERD (gastroesophageal reflux disease)    takes Pantoprazole daily as needed  . Glaucoma    uses eye drops daily  . Headache   . History of colon polyps    benign  . History of kidney stones   . Hyperlipidemia    no on any meds  . Hypertension    takes Amlodipine and Atenolol daily  . OSA on CPAP   . Vocal cord nodule    pt. states  it's a" growth on vocal cord"  :    Past Surgical History:  Procedure Laterality Date  . AORTIC ARCH ANGIOGRAPHY N/A 04/16/2016   Procedure: Aortic Arch Angiography;  Surgeon: Zaccheaus Storlie M Martinique, MD;  Location: [facility];  Service: Cardiovascular;  Laterality: N/A;  . AORTIC VALVE REPLACEMENT N/A 09/15/2016   Procedure: AORTIC VALVE REPLACEMENT (AVR);  Surgeon: Grace Isaac, MD;  Location: [facility];  Service: Open  Heart Surgery;  Laterality: N/A;  Using 17mm Edwards Perimount Magna Ease Aortic Bioprosthesis Valve  . ASCENDING AORTIC ROOT REPLACEMENT N/A 09/15/2016   Procedure: ASCENDING AORTIC ROOT REPLACEMENT;  Surgeon: Grace Isaac, MD;  Location: [facility];  Service: Open Heart Surgery;  Laterality: N/A;  Using 42mm Gelweave Valsalva Graft  . COLONOSCOPY    . COLONOSCOPY Left 04/16/2019   Procedure: COLONOSCOPY;  Surgeon: Arta Silence, MD;  Location[facility] ENDOSCOPY;  Service: Endoscopy;  Laterality: Left;  . COLONOSCOPY WITH ESOPHAGOGASTRODUODENOSCOPY (EGD)    .  ESOPHAGOGASTRODUODENOSCOPY N/A 05/25/2020   Procedure: ESOPHAGOGASTRODUODENOSCOPY (EGD);  Surgeon: Wilford Corner, MD;  Location: [facility];  Service: Endoscopy;  Laterality: N/A;  . ESOPHAGOGASTRODUODENOSCOPY (EGD) WITH PROPOFOL Left 04/16/2019   Procedure: ESOPHAGOGASTRODUODENOSCOPY (EGD) WITH PROPOFOL;  Surgeon: Arta Silence, MD;  Location[facility] ENDOSCOPY;  Service: Endoscopy;  Laterality: Left;  . FLEXIBLE SIGMOIDOSCOPY N/A 05/25/2020   Procedure: FLEXIBLE SIGMOIDOSCOPY;  Surgeon: Wilford Corner, MD;  Location: [facility];  Service: Endoscopy;  Laterality: N/A;  . IR THORACENTESIS ASP PLEURAL SPACE W/IMG GUIDE  10/23/2016  . MICROLARYNGOSCOPY Right 09/20/2017   Procedure: MICROLARYNGOSCOPY WITH  EXCISION OF VOCAL CORD LESION;  Surgeon: Leta Baptist, MD;  Location: [place];  Service: ENT;  Laterality: Right;  . MULTIPLE EXTRACTIONS WITH ALVEOLOPLASTY N/A 06/10/2016   Procedure: Extraction of tooth #'s 2,8,13,15, and 29  with alveoloplasty, maxillary right and left buccal exostoses reductions, and gross debridement of remaining teeth.;  Surgeon: Lenn Cal, DDS;  Location: [place];  Service: Oral Surgery;  Laterality: N/A;  . RIGHT/LEFT HEART CATH AND CORONARY ANGIOGRAPHY N/A 04/16/2016   Procedure: Right/Left Heart Cath and Coronary Angiography;  Surgeon: Jamieon Lannen M Martinique, MD;  Location: [place];  Service: Cardiovascular;  Laterality: N/A;  . TEE WITHOUT CARDIOVERSION N/A 09/15/2016   Procedure: TRANSESOPHAGEAL ECHOCARDIOGRAM (TEE);  Surgeon: Grace Isaac, MD;  Location: [facility];  Service: Open Heart Surgery;  Laterality: N/A;  :   CURRENT MEDS: Current Facility-Administered Medications  Medication Dose Route Frequency Provider Last Rate Last Admin  . acetaminophen (TYLENOL) tablet 650 mg  650 mg Oral Q6H PRN Karmen Bongo, MD       Or  . acetaminophen (TYLENOL) suppository 650 mg  650 mg Rectal Q6H PRN Karmen Bongo, MD      . Derrill Memo ON 06/01/2020]  amLODipine (NORVASC) tablet 10 mg  10 mg Oral Q1200 Karmen Bongo, MD      . hydrALAZINE (APRESOLINE) injection 5 mg  5 mg Intravenous Q4H PRN Karmen Bongo, MD      . lactated ringers infusion   Intravenous Continuous Karmen Bongo, MD 100 mL/hr at 05/31/20 1355 New Bag at 05/31/20 1355  . montelukast (SINGULAIR) tablet 10 mg  10 mg Oral Q1200 Karmen Bongo, MD   10 mg at 05/31/20 1450  . morphine 2 MG/ML injection 2 mg  2 mg Intravenous Q2H PRN Karmen Bongo, MD      . ondansetron [facility]) tablet 4 mg  4 mg Oral Q6H PRN Karmen Bongo, MD       Or  . ondansetron [facility]) injection 4 mg  4 mg Intravenous Q6H PRN Karmen Bongo, MD      . pantoprazole (PROTONIX) injection 40 mg  40 mg Intravenous Lillia Mountain, MD   40 mg at 05/31/20 1350  . polyethylene glycol-electrolytes (NuLYTELY) solution 4,000 mL  4,000 mL Oral Once Karmen Bongo, MD      . Derrill Memo ON 06/01/2020] rosuvastatin (CRESTOR) tablet 10  mg  10 mg Oral Q1200 Karmen Bongo, MD      . sodium chloride flush (NS) 0.9 % injection 3 mL  3 mL Intravenous Q12H Karmen Bongo, MD       Current Outpatient Medications  Medication Sig Dispense Refill  . amLODipine (NORVASC) 10 MG tablet Take 10 mg by mouth daily at 12 noon.    . Ensure Plus (ENSURE PLUS) LIQD Take 237 mLs by mouth daily.    . ferrous sulfate 325 (65 FE) MG tablet Take 325 mg by mouth daily at 12 noon.    . fluticasone (FLONASE) 50 MCG/ACT nasal spray Place 1 spray into both nostrils daily as needed for allergies or rhinitis.    Marland Kitchen latanoprost (XALATAN) 0.005 % ophthalmic solution Place 1 drop into both eyes 2 (two) times daily.    . montelukast (SINGULAIR) 10 MG tablet Take 10 mg by mouth at bedtime.    . Multiple Vitamin (MULTIVITAMIN WITH MINERALS) TABS tablet Take 1 tablet by mouth daily at 12 noon.    . pantoprazole (PROTONIX) 40 MG tablet Take 20 mg by mouth 2 (two) times daily.    . rosuvastatin (CRESTOR) 10 MG tablet Take 1 tablet by mouth once  daily (Patient taking differently: Take 10 mg by mouth daily at 12 noon.) 90 tablet 1  . triamcinolone cream (KENALOG) 0.1 % Apply 1 application topically 2 (two) times daily as needed (facial itching/rash).    Marland Kitchen acetaminophen (TYLENOL) 325 MG tablet Take 2 tablets (650 mg total) by mouth every 6 (six) hours as needed for mild pain or fever.    Marland Kitchen omega-3 acid ethyl esters (LOVAZA) 1 g capsule Take 2 capsules (2 g total) by mouth 2 (two) times daily. (Patient not taking: Reported on 05/31/2020) 60 capsule 3      No Known Allergies:  Family History  Problem Relation Age of Onset  . Heart disease Father   . Heart attack Father   . Thyroid disease Sister   :  Social History   Socioeconomic History  . Marital status: Married    Spouse name: Not on file  . Number of children: 1  . Years of education: Not on file  . Highest education level: Not on file  Occupational History  . Occupation: Musician  Tobacco Use  . Smoking status: Former Smoker    Packs/day: 1.00    Years: 25.00    Pack years: 25.00    Types: Cigarettes  . Smokeless tobacco: Never Used  Vaping Use  . Vaping Use: Never used  Substance and Sexual Activity  . Alcohol use: No    Alcohol/week: 0.0 standard drinks  . Drug use: No  . Sexual activity: Not on file  Other Topics Concern  . 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   Social Determinants of Health   Financial Resource Strain: Not on file  Food Insecurity: No Food Insecurity  . Worried About Charity fundraiser in the Last Year: Never true  . Ran Out of Food in the Last Year: Never true  Transportation Needs: No Transportation Needs  . Lack of Transportation (Medical): No  . Lack of Transportation (Non-Medical): No  Physical Activity: Not on file  Stress: Not on file  Social Connections: Not on file  Intimate Partner Violence: Not on file  :  REVIEW OF SYSTEMS:  A comprehensive 14 point review  of systems  was negative except as noted in the HPI.    Exam: Patient Vitals for the past 24 hrs:  BP Temp Temp src Pulse Resp SpO2 Height Weight  05/31/20 1530 111/88 -- -- 91 (!) 22 99 % -- --  05/31/20 1515 108/78 -- -- 87 18 100 % -- --  05/31/20 1445 101/62 -- -- 86 (!) 23 99 % -- --  05/31/20 1400 108/80 99.2 F (37.3 C) Oral 81 18 100 % -- --  05/31/20 1300 109/75 -- -- 75 (!) 22 99 % -- --  05/31/20 1230 100/81 -- -- 76 18 100 % -- --  05/31/20 1200 115/84 -- -- 81 15 100 % -- --  05/31/20 1130 113/74 -- -- 75 15 100 % -- --  05/31/20 1115 116/82 -- -- 78 20 100 % -- --  05/31/20 1100 113/79 -- -- 77 18 100 % -- --  05/31/20 1045 104/67 -- -- 73 (!) 25 100 % -- --  05/31/20 1030 -- -- -- 77 (!) 0 99 % -- --  05/31/20 1015 -- -- -- 78 (!) 21 99 % -- --  05/31/20 1000 -- -- -- 77 20 100 % -- --  05/31/20 0945 -- -- -- (!) 45 (!) 9 99 % -- --  05/31/20 0930 -- -- -- 78 17 99 % -- --  05/31/20 0915 -- -- -- 76 (!) 23 100 % -- --  05/31/20 0900 -- -- -- 77 14 100 % -- --  05/31/20 0800 -- -- -- 70 20 99 % -- --  05/31/20 0745 -- -- -- 67 (!) 21 100 % -- --  05/31/20 0730 108/76 -- -- 72 20 100 % -- --  05/31/20 0728 102/75 -- -- 70 20 100 % -- --  05/31/20 0715 102/75 -- -- 65 18 100 % -- --  05/31/20 0700 103/83 -- -- 65 18 100 % -- --  05/31/20 0646 (!) 108/57 98.5 F (36.9 C) Oral 67 20 98 % -- --  05/31/20 0645 (!) 104/55 -- -- 68 20 99 % -- --  05/31/20 0600 (!) 65/45 -- -- (!) 50 (!) 23 100 % -- --  05/31/20 0545 -- -- -- 81 (!) 21 99 % -- --  05/31/20 0530 107/78 -- -- 85 (!) 22 98 % -- --  05/31/20 0515 112/88 -- -- (!) 55 (!) 21 100 % -- --  05/31/20 0453 117/76 98.6 F (37 C) -- 87 19 100 % -- --  05/31/20 0452 -- -- -- -- -- -- 5\' 9"  (1.753 m) 102 kg    General:  well-nourished in no acute distress.   Eyes:  no scleral icterus.   ENT:  There were no oropharyngeal lesions.   Lymphatics:  Negative cervical, supraclavicular or axillary adenopathy.    Respiratory: lungs were clear bilaterally without wheezing or crackles.   Cardiovascular:  Regular rate and rhythm, S1/S2, without murmur, rub or gallop.  There was no pedal edema.   GI: Positive bowel sounds, soft, tenderness to palpation to the lower abdomen. Musculoskeletal: Strength symmetrical in the upper and lower extremities. Skin: Scattered ecchymoses, no petechiae Neuro exam was nonfocal.  Patient was alert and oriented.  Attention was good.   Language was appropriate.  Mood was normal without depression.  Speech was not pressured.  Thought content was not tangential.    LABS:  Lab Results  Component Value Date   WBC 12.2 (H) 05/31/2020   HGB 9.9 (L) 05/31/2020  HCT 30.1 (L) 05/31/2020   PLT PLATELET CLUMPS NOTED ON SMEAR, UNABLE TO ESTIMATE 05/31/2020   GLUCOSE 136 (H) 05/31/2020   CHOL 157 05/11/2019   TRIG 201 (H) 05/26/2020   HDL 32 (L) 05/11/2019   LDLCALC 81 05/11/2019   ALT 35 05/31/2020   AST 39 05/31/2020   NA 138 05/31/2020   K 3.2 (L) 05/31/2020   CL 101 05/31/2020   CREATININE 1.37 (H) 05/31/2020   BUN 15 05/31/2020   CO2 26 05/31/2020   INR 1.0 05/31/2020   HGBA1C 5.3 09/11/2016    NM GI Blood Loss  Addendum Date: 05/25/2020   ADDENDUM REPORT: 05/25/2020 07:41 ADDENDUM: Study discussed by telephone with Dr. Valeta Harms on 05/25/2020 at 0733 hours. Electronically Signed   By: Genevie Ann M.D.   On: 05/25/2020 07:41   Result Date: 05/25/2020 CLINICAL DATA:  70 year old male with bright red blood per rectum. Recent sigmoidoscopy revealing blood clots and diverticular disease. EXAM: NUCLEAR MEDICINE GASTROINTESTINAL BLEEDING SCAN TECHNIQUE: Sequential abdominal images were obtained following intravenous administration of Tc-10m labeled red blood cells. RADIOPHARMACEUTICALS:  Twenty-four mCi Tc-3m pertechnetate in-vitro labeled red cells. COMPARISON:  CT Abdomen and Pelvis 05/03/2020. FINDINGS: Expected blood pool activity. Tortuous abdominal aorta redemonstrated. No  abnormal radiotracer accumulation in the upper, mid abdomen, or lower quadrants. However, located about 14 cm caudal to the aortoiliac bifurcation is intermittent moderate to intense accumulation of radiotracer activity. Judging from the coronal CT Abdomen and Pelvis, this is caudal to the expected level of the urinary bladder. This activity waxes and wanes. There is no transit of this activity to confirm a GI source. IMPRESSION: This examination is suspicious for but not confirmatory of active GI bleeding from the distal rectum. No abnormal radiotracer activity elsewhere in the abdomen. Electronically Signed: By: Genevie Ann M.D. On: 05/25/2020 07:21   DG Chest Port 1 View  Result Date: 05/25/2020 CLINICAL DATA:  70 year old male intubated.  GI bleeding. EXAM: PORTABLE CHEST 1 VIEW COMPARISON:  Portable chest 05/24/2020 and earlier. FINDINGS: Portable AP semi upright view at 0247 hours. Endotracheal tube in good position just below the level the clavicles. Lower lung volumes. Right IJ central line position not significantly changed. Stable cardiac size and mediastinal contours. No pneumothorax. Increased bibasilar atelectasis. No pulmonary edema or pleural effusion identified. Paucity of bowel gas in the upper abdomen. No acute osseous abnormality identified. IMPRESSION: 1. Intubated, ETT tip in good position just below the clavicles. 2. Lower lung volumes with new bibasilar atelectasis. 3. Stable right IJ central line. Electronically Signed   By: Genevie Ann M.D.   On: 05/25/2020 05:15   DG CHEST PORT 1 VIEW  Result Date: 05/25/2020 CLINICAL DATA:  Central line placement. EXAM: PORTABLE CHEST 1 VIEW COMPARISON:  May 24, 2020 (8:45 p.m.) FINDINGS: A right internal jugular venous catheter is seen with its distal tip noted at the junction of the superior vena cava and right atrium. Multiple sternal wires are seen. The heart size and mediastinal contours are within normal limits. An artificial aortic valve is noted.  Both lungs are clear. The visualized skeletal structures are unremarkable. IMPRESSION: Right internal jugular venous catheter placement and positioning, as described above. Electronically Signed   By: Virgina Norfolk M.D.   On: 05/25/2020 01:04   DG Chest Port 1 View  Result Date: 05/24/2020 CLINICAL DATA:  GI bleed, syncope. EXAM: PORTABLE CHEST 1 VIEW COMPARISON:  October 31, 2018 FINDINGS: Multiple sternal wires are noted. The heart size and mediastinal contours are  within normal limits. An artificial aortic valve is seen. Both lungs are clear. The visualized skeletal structures are unremarkable. IMPRESSION: No active disease. Electronically Signed   By: Virgina Norfolk M.D.   On: 05/24/2020 21:31   CT Angio Abd/Pel W and/or Wo Contrast  Result Date: 05/31/2020 CLINICAL DATA:  Rectal bleeding EXAM: CTA ABDOMEN AND PELVIS WITHOUT AND WITH CONTRAST TECHNIQUE: Multidetector CT imaging of the abdomen and pelvis was performed using the standard protocol during bolus administration of intravenous contrast. Multiplanar reconstructed images and MIPs were obtained and reviewed to evaluate the vascular anatomy. CONTRAST:  160mL OMNIPAQUE IOHEXOL 350 MG/ML SOLN COMPARISON:  Recent CT a abdomen/pelvis 05/25/2020 FINDINGS: VASCULAR Aorta: Stable fusiform aneurysmal dilation of the infrarenal abdominal aorta with a maximal diameter of 4.8 cm. No change compared to recent prior imaging. Mild calcified atherosclerotic plaque. Wall adherent mural thrombus within the aneurysmal segment. Celiac: Patent without evidence of aneurysm, dissection, vasculitis or significant stenosis. Lateral segmental branch of the left hepatic artery is replaced to the left gastric artery. SMA: Patent without evidence of aneurysm, dissection, vasculitis or significant stenosis. Renals: Both main renal arteries are patent without evidence of aneurysm, dissection, vasculitis, fibromuscular dysplasia or significant stenosis. Small accessory  artery to the left lower pole. IMA: Patent without evidence of aneurysm, dissection, vasculitis or significant stenosis. 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 Chronic non flow limiting dissection flap also present along the right common iliac artery secondary to a penetrating atherosclerotic ulcer. No aneurysmal dilation. 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 Aneurysmal dilation of the right internal iliac artery up to 3.0 cm, unchanged. The external iliac arteries are relatively spared from disease. Proximal Outflow: Bilateral common femoral and visualized portions of the superficial and profunda femoral arteries are patent without evidence of aneurysm, dissection, vasculitis or significant stenosis. Veins: No focal venous abnormality. Review of the MIP images confirms the above findings. NON-VASCULAR Lower chest: No acute abnormality. Hepatobiliary: Large gallstone without evidence of gallbladder wall thickening or inflammation. Normal hepatic contour morphology. Stable circumscribed low-attenuation lesions consistent with small cysts or biliary hamartomas. No solid lesion identified. No biliary ductal dilatation. Pancreas: Unremarkable. No pancreatic ductal dilatation or surrounding inflammatory changes. Spleen: Normal in size without focal abnormality. Adrenals/Urinary Tract: Normal adrenal glands. Stable bilateral renal cysts and parapelvic renal sinus cysts of varying complexity without interval change compared to recent prior imaging. Stomach/Bowel: Colonic diverticular disease without CT evidence of active inflammation. No evidence of obstruction or focal bowel wall thickening. Normal appendix in the right lower quadrant. The terminal ileum is unremarkable. Lymphatic: No suspicious lymphadenopathy. Reproductive: Prostate is unremarkable. Other: Fat containing  umbilical hernia. No inguinal hernia. No ascites. Musculoskeletal: No acute fracture or aggressive appearing lytic or blastic osseous lesion. Multilevel degenerative disc disease. IMPRESSION: VASCULAR 1. No evidence of active gastrointestinal bleeding at this time. 2. Stable infrarenal abdominal aortic, left common iliac, left anterior division internal iliac and right internal iliac artery aneurysms without change compared to recent prior imaging. Recommend follow-up every 6 months and vascular consultation. This recommendation follows ACR consensus guidelines: White Paper of the ACR Incidental Findings Committee II on Vascular Findings. J Am Coll Radiol 2013; 10:789-794. NON-VASCULAR 1. No acute abnormality within the abdomen or pelvis. 2. Colonic diverticular disease without CT evidence of active inflammation. 3. Additional ancillary findings as above without significant interval change. Aortic Atherosclerosis (ICD10-I70.0);  Aortic aneurysm NOS (ICD10-I71.9). Electronically Signed   By: Jacqulynn Cadet M.D.   On: 05/31/2020 09:36   CT Angio Abd/Pel W and/or Wo Contrast  Result Date: 05/25/2020 CLINICAL DATA:  70 year old gentleman with significant GI bleed leading to cardiac arrest. History of diverticular disease and ulcerative colitis. EXAM: CTA ABDOMEN AND PELVIS WITHOUT AND WITH CONTRAST TECHNIQUE: Multidetector CT imaging of the abdomen and pelvis was performed using the standard protocol during bolus administration of intravenous contrast. Multiplanar reconstructed images and MIPs were obtained and reviewed to evaluate the vascular anatomy. CONTRAST:  131mL OMNIPAQUE IOHEXOL 350 MG/ML SOLN COMPARISON:  05/03/2020 01/30/2014 FINDINGS: VASCULAR Aorta: Infrarenal abdominal aortic aneurysm measures 4.8 x 4.2 cm which is not significantly changed since the prior study. The aneurysm measured 3.9 x 3.1 cm on prior CT from 01/30/2014. Celiac: Mild dilatation of the celiac artery measuring up to 9 mm is not  significantly changed dating back to 01/30/2014. celiac artery is otherwise normal. SMA: Mild narrowing of the proximal superior mesenteric artery predominately due to noncalcified plaque. Otherwise normal. Renals: No significant stenosis of the renal arteries. IMA: Inferior mesenteric artery is patent and originates at the level of the abdominal aortic aneurysm. Inflow: Aneurysmal dilatation of the right internal iliac artery measuring up to 2.9 cm has increased in size since 2016 where it measured 1.7 cm. Right common and external iliac arteries are normal. The left common iliac artery is dilated measuring up to 1.8 cm which is only slightly larger in size since 2016 where it measured 1.6 cm. Non flow limiting dissection noted within the left common iliac artery. 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 The internal iliac artery appears occluded at the level of the aneurysm. There is opacification of distal branches consistent with retrograde collateral flow. Left external iliac artery is patent. Proximal Outflow: No significant abnormality of the visualized inflow vessels bilaterally. Veins: No significant abnormality of the hepatic, portal, or superior mesenteric veins. Review of the MIP images confirms the above findings. NON-VASCULAR Lower chest: Mild bibasilar atelectasis. Hepatobiliary: Subcentimeter hepatic hypodensity in the left lobe is too small to fully characterize. Liver otherwise normal. Cholelithiasis. Pancreas: Unremarkable. No pancreatic ductal dilatation or surrounding inflammatory changes. Spleen: Normal in size without focal abnormality. Adrenals/Urinary Tract: Adrenal glands are normal. Layering calculi again seen in the upper pole of the left kidney. Multiple bilateral renal cysts again seen. No hydronephrosis. Small diverticulum again seen at the anterior bladder dome. Bladder otherwise unremarkable. Stomach/Bowel: Stomach and  small bowel unremarkable. There is mild pericolonic fat stranding along the sigmoid and descending colon suggestive of mild colitis. There is extensive diverticulosis of the descending and sigmoid colon. No active hemorrhage identified. Lymphatic: No enlarged lymph nodes Reproductive: Prostate is mildly enlarged. Other: Small fat containing umbilical hernia. Musculoskeletal: Severe asymmetric right hip osteoarthrosis with partial collapse of the femoral head suspicious for avascular necrosis. Mild-to-moderate degenerative changes seen throughout the lumbar spine. IMPRESSION: VASCULAR 1. No active gastrointestinal hemorrhage. 2. Aneurysm of the infrarenal abdominal aorta measuring up to 4.8 x 4.2 cm has increased in size since 2016 where it measured 3.9 x 3.1 cm. 3. 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 Aneurysm on the right measures 2.9 cm currently compared to 1.7 cm in 2016. 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 4. 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  NON-VASCULAR Extensive diverticulosis of the descending and sigmoid colon. Mild stranding of the pericolonic fat adjacent to the segments suggestive of low-grade inflammation. Electronically Signed   By: Miachel Roux M.D.   On: 05/25/2020 10:29     ASSESSMENT AND PLAN:  1.  Thrombocytopenia, chronic ITP 2.  Anemia secondary to GI bleed 3.  Obstructive sleep apnea 4.  Hypertension 5.  CAD 6.  AAA  -Platelets clumped on admission but were 74,000 two days ago.  Recommend recollect.  Further recommendations pending repeat collection of platelet count.  Previous von Willebrand panel negative. -Has been seen by [facility] for bleeding scan, colonoscopy, and possible endoscopy.  May need IR guided embolization if bleeding scan positive. -Transfuse PRBCs to keep hemoglobin above 8 in the setting of active bleeding.  2 units ordered by GI.  Thank you for  this referral.  Mikey Bussing, DNP, AGPCNP-BC, AOCNP  ADDENDUM: I saw and examined Lonnie Hansen this morning.  He has GI bleeding.  This is clearly an anatomic issue.  It seems like this is diverticular in nature.  His platelet count yesterday was 93,000.  I just have a very hard time believing that his platelet count is of any consequence here.  I just do not believe that his thrombocytopenia, which is minimal at best, is a factor.  He has not been taking any aspirin or nonsteroidals.  We will check a von Willebrand panel on him.  There is no lower back yet today.  I think 1 possibility which we can do is give him a unit of platelets.  If he has an inherent platelet dysfunctional issue, then I think a unit of platelets would help.  Again, this appears to be an anatomic issue with respect to diverticulosis.  He says he is having some urinary issues.  I do not know if  there is a large prostate.  Right now, I would just watch.  I would hate to have to transfuse.  I do not think we need to give him any agent to improve his platelet count.  I appreciate all the care that he is getting on 6 E.  Lattie Haw, MD  Jeneen Rinks 1:5

## 2020-05-31 NOTE — ED Notes (Signed)
Patient on call light, placed on bed pan at this time. Awaiting transport.

## 2020-05-31 NOTE — ED Notes (Signed)
Blood bank called stating that they have 2 units available for patient. Due to antibody, they will need another test for more units of blood.

## 2020-05-31 NOTE — Progress Notes (Signed)
Had spoken with Dr. Therisa Doyne  To clarify if pt blood transfusion be held for now pending NM blood Loss study  Which takes 2 1/2 hrs to complete. Dr. Therisa Doyne  Prefers to have blood transfusion started ASAP . Spoke with [facility] for test and they did say that they can do test even when blood transfusion is ongoing but she/they prefer that the blood tx not be started due to logistics/staff to be with pt in case pt has reaction.

## 2020-05-31 NOTE — ED Provider Notes (Addendum)
MSE was initiated and I personally evaluated the patient and placed orders (if any) at  4:55 AM on May 31, 2020.  Patient here with CC of rectal bleeding.  Hx of the same.  States he's "coded out before" from blood loss.  Alert and oriented No respiratory distress Appears pale  Discussed with patient that their care has been initiated.   They are counseled that they will need to remain in the ED until the completion of their workup, including full H&P and results of any tests.  Risks of leaving the emergency department prior to completion of treatment were discussed. Patient was advised to inform ED staff if they are leaving before their treatment is complete. The patient acknowledged these risks and time was allowed for questions.    The patient appears stable so that the remainder of the MSE may be completed by another provider.  Patient taken to next available room due to history.    Montine Circle, PA-C 05/31/20 [id number]    Montine Circle, PA-C 05/31/20 [id number]    Margette Fast, MD 05/31/20 [phone]

## 2020-05-31 NOTE — ED Notes (Signed)
Dark red liquid stool with clots

## 2020-06-01 ENCOUNTER — Encounter (HOSPITAL_COMMUNITY): Payer: Self-pay | Admitting: Internal Medicine

## 2020-06-01 ENCOUNTER — Encounter (HOSPITAL_COMMUNITY)
Admission: EM | Disposition: A | Discharge status: Home / Self Care | Payer: Self-pay | Source: Home / Self Care | Attending: Internal Medicine

## 2020-06-01 ENCOUNTER — Inpatient Hospital Stay (HOSPITAL_COMMUNITY): Payer: Medicare Other | Admitting: Anesthesiology

## 2020-06-01 DIAGNOSIS — D693 Immune thrombocytopenic purpura: Secondary | ICD-10-CM | POA: Diagnosis not present

## 2020-06-01 DIAGNOSIS — I10 Essential (primary) hypertension: Secondary | ICD-10-CM | POA: Diagnosis not present

## 2020-06-01 DIAGNOSIS — K5791 Diverticulosis of intestine, part unspecified, without perforation or abscess with bleeding: Secondary | ICD-10-CM | POA: Diagnosis not present

## 2020-06-01 DIAGNOSIS — D6489 Other specified anemias: Secondary | ICD-10-CM

## 2020-06-01 DIAGNOSIS — K922 Gastrointestinal hemorrhage, unspecified: Secondary | ICD-10-CM | POA: Diagnosis not present

## 2020-06-01 HISTORY — PX: COLONOSCOPY: SHX5424

## 2020-06-01 LAB — BASIC METABOLIC PANEL
Anion gap: 10 (ref 5–15)
BUN: 24 mg/dL — ABNORMAL HIGH (ref 8–23)
CO2: 22 mmol/L (ref 22–32)
Calcium: 8.2 mg/dL — ABNORMAL LOW (ref 8.9–10.3)
Chloride: 103 mmol/L (ref 98–111)
Creatinine, Ser: 1.22 mg/dL (ref 0.61–1.24)
GFR, Estimated: >60 mL/min (ref >60)
Glucose, Bld: 125 mg/dL — ABNORMAL HIGH (ref 70–99)
Potassium: 3.5 mmol/L (ref 3.5–5.1)
Sodium: 135 mmol/L (ref 135–145)

## 2020-06-01 LAB — SAVE SMEAR(SSMR), FOR PROVIDER SLIDE REVIEW

## 2020-06-01 LAB — URINALYSIS, ROUTINE W REFLEX MICROSCOPIC
Bacteria, UA: NONE SEEN
Bilirubin Urine: NEGATIVE
Glucose, UA: NEGATIVE mg/dL
Ketones, ur: NEGATIVE mg/dL
Leukocytes,Ua: NEGATIVE
Nitrite: NEGATIVE
Protein, ur: NEGATIVE mg/dL
Specific Gravity, Urine: 1.005 (ref 1.005–1.030)
pH: 7 (ref 5.0–8.0)

## 2020-06-01 LAB — CBC
HCT: 26.9 % — ABNORMAL LOW (ref 39.0–52.0)
Hemoglobin: 9 g/dL — ABNORMAL LOW (ref 13.0–17.0)
MCH: 29.4 pg (ref 26.0–34.0)
MCHC: 33.5 g/dL (ref 30.0–36.0)
MCV: 87.9 fL (ref 80.0–100.0)
Platelets: 70 10*3/uL — ABNORMAL LOW (ref 150–400)
RBC: 3.06 MIL/uL — ABNORMAL LOW (ref 4.22–5.81)
RDW: 16.7 % — ABNORMAL HIGH (ref 11.5–15.5)
WBC: 10.9 10*3/uL — ABNORMAL HIGH (ref 4.0–10.5)
nRBC: 0.4 % — ABNORMAL HIGH (ref 0.0–0.2)

## 2020-06-01 LAB — CBC WITH DIFFERENTIAL/PLATELET
Abs Immature Granulocytes: 0.26 10*3/uL — ABNORMAL HIGH (ref 0.00–0.07)
Basophils Absolute: 0 10*3/uL (ref 0.0–0.1)
Basophils Relative: 0 %
Eosinophils Absolute: 0.1 10*3/uL (ref 0.0–0.5)
Eosinophils Relative: 1 %
HCT: 29.7 % — ABNORMAL LOW (ref 39.0–52.0)
Hemoglobin: 10 g/dL — ABNORMAL LOW (ref 13.0–17.0)
Immature Granulocytes: 2 %
Lymphocytes Relative: 12 %
Lymphs Abs: 1.5 10*3/uL (ref 0.7–4.0)
MCH: 30 pg (ref 26.0–34.0)
MCHC: 33.7 g/dL (ref 30.0–36.0)
MCV: 89.2 fL (ref 80.0–100.0)
Monocytes Absolute: 1.3 10*3/uL — ABNORMAL HIGH (ref 0.1–1.0)
Monocytes Relative: 11 %
Neutro Abs: 9.1 10*3/uL — ABNORMAL HIGH (ref 1.7–7.7)
Neutrophils Relative %: 74 %
Platelets: 72 10*3/uL — ABNORMAL LOW (ref 150–400)
RBC: 3.33 MIL/uL — ABNORMAL LOW (ref 4.22–5.81)
RDW: 16.8 % — ABNORMAL HIGH (ref 11.5–15.5)
WBC: 12.8 10*3/uL — ABNORMAL HIGH (ref 4.0–10.5)
nRBC: 0 /100 WBC
nRBC: 0.2 % (ref 0.0–0.2)

## 2020-06-01 LAB — APTT: aPTT: 25 seconds (ref 24–36)

## 2020-06-01 SURGERY — COLONOSCOPY
Anesthesia: Monitor Anesthesia Care

## 2020-06-01 MED ORDER — POLYETHYLENE GLYCOL 3350 17 G PO PACK
17.0000 g | PACK | Freq: Every day | ORAL | Status: DC
  Administered 2020-06-02 – 2020-06-03 (×2): 17 g via ORAL
  Filled 2020-06-01 (×2): qty 1

## 2020-06-01 MED ORDER — PSYLLIUM 95 % PO PACK
1.0000 | PACK | Freq: Every day | ORAL | Status: DC
  Administered 2020-06-01 – 2020-06-03 (×3): 1 via ORAL
  Filled 2020-06-01 (×3): qty 1

## 2020-06-01 MED ORDER — PROPOFOL 500 MG/50ML IV EMUL
INTRAVENOUS | Status: DC | PRN
  Administered 2020-06-01: 100 ug/kg/min via INTRAVENOUS

## 2020-06-01 MED ORDER — PANTOPRAZOLE SODIUM 40 MG IV SOLR
40.0000 mg | INTRAVENOUS | Status: DC
  Administered 2020-06-02 – 2020-06-03 (×2): 40 mg via INTRAVENOUS
  Filled 2020-06-01 (×2): qty 40

## 2020-06-01 MED ORDER — PROPOFOL 10 MG/ML IV BOLUS
INTRAVENOUS | Status: DC | PRN
  Administered 2020-06-01: 20 mg via INTRAVENOUS
  Administered 2020-06-01: 30 mg via INTRAVENOUS

## 2020-06-01 NOTE — Progress Notes (Signed)
OOB to [facility] with dark bloody liquid stool approx 531ml out. Pt has completed drinking 1/2 of gallon of nulytely prep. C/O feeling like the prep is making his bleeding worse. C/O dizziness back to bed. Instructed to lay down in bed. BP = 134/92, HR = 96 in SR with occ PVC's. Sats = 100 % on RA. Endoscopy nurse notified of pt's progress with prep. MD to be notified. States dizziness resolved after lying back in bed. VSS. Encouraged to continue drinking prep and to call for needs OOB etc. Call bell in reach.

## 2020-06-01 NOTE — Op Note (Signed)
[facility] Patient Name: Lonnie Hansen Procedure Date : 06/01/2020 MRN: [id number] Attending MD: Ronnette Juniper , MD Date of Birth: 12/03/1950 CSN: [id number] Age: 70 Admit Type: Inpatient Procedure:                Colonoscopy Indications:              Rectal bleeding, NM GI Bleed scan positive for                            bleed in distal rectum Providers:                Ronnette Juniper, MD, Dulcy Fanny, Cletis Athens,                            Technician, Claybon Jabs CRNA, CRNA Referring MD:             [facility] Medicines:                Monitored Anesthesia Care Complications:            No immediate complications. Estimated Blood Loss:     Estimated blood loss: none. Procedure:                Pre-Anesthesia Assessment:                           - Prior to the procedure, a History and Physical                            was performed, and patient medications and                            allergies were reviewed. The patient's tolerance of                            previous anesthesia was also reviewed. The risks                            and benefits of the procedure and the sedation                            options and risks were discussed with the patient.                            All questions were answered, and informed consent                            was obtained. Prior Anticoagulants: The patient has                            taken no previous anticoagulant or antiplatelet                            agents. ASA Grade Assessment: III - A patient with  severe systemic disease. After reviewing the risks                            and benefits, the patient was deemed in                            satisfactory condition to undergo the procedure.                           After obtaining informed consent, the colonoscope                            was passed under direct vision. Throughout the                             procedure, the patient's blood pressure, pulse, and                            oxygen saturations were monitored continuously. The                            PCF-H190DL ([id number] Olympus pediatric colonscope                            was introduced through the anus and advanced to the                            the cecum, identified by appendiceal orifice and                            ileocecal valve. The colonoscopy was performed                            without difficulty. The patient tolerated the                            procedure well. The quality of the bowel                            preparation was fair. Scope In: 1:12:26 PM Scope Out: 1:42:47 PM Scope Withdrawal Time: 0 hours 25 minutes 13 seconds  Total Procedure Duration: 0 hours 30 minutes 21 seconds  Findings:      The perianal and digital rectal examinations were normal.      Old dark blood and clots was found in the entire colon.      Multiple small and large-mouthed diverticula were found in the sigmoid       colon, descending colon and transverse colon upto 65 cm from insertion.       Thorough lavage was performed and an attempt was made to wash each of       the seen diverticulum.      However, there was no active bleeding noted.      Non-bleeding internal hemorrhoids were found during retroflexion. The       hemorrhoids were large.      A localized area  of moderately erythematous, inflamed and thickened       folds of the mucosa was found in the recto-sigmoid colon and in the       sigmoid colon.This could be mucosal prolapse related changes from       underlying diverticular disease. Impression:               - Preparation of the colon was fair.                           - Blood in the entire examined colon.                           - Diverticulosis in the sigmoid colon, in the                            descending colon and in the transverse colon.                           - Non-bleeding internal  hemorrhoids.                           - No specimens collected. Moderate Sedation:      Patient did not receive moderate sedation for this procedure, but       instead received monitored anesthesia care. Recommendation:           - Clear liquid diet.                           - Clinical presentation suggestive of diverticular                            bleed, possibly resolved at the moment.                           - If rebleeding occurs, recommend IR guided                            embolization.                           - Will start miralax 17 gm once a day and metamucil                            once a day. Procedure Code(s):        --- Professional ---                           [phone], Colonoscopy, flexible; diagnostic, including                            collection of specimen(s) by brushing or washing,                            when performed (separate procedure) Diagnosis Code(s):        --- Professional ---  K64.8, Other hemorrhoids                           K92.2, Gastrointestinal hemorrhage, unspecified                           K62.5, Hemorrhage of anus and rectum                           K57.30, Diverticulosis of large intestine without                            perforation or abscess without bleeding CPT copyright 2019 [employer]. All rights reserved. The codes documented in this report are preliminary and upon coder review may  be revised to meet current compliance requirements. Ronnette Juniper, MD 06/01/2020 1:55:43 PM This report has been signed electronically. Number of Addenda: 0

## 2020-06-01 NOTE — Anesthesia Preprocedure Evaluation (Addendum)
Anesthesia Evaluation  Patient identified by MRN, date of birth, ID band Patient awake    Reviewed: Allergy & Precautions, NPO status , Patient's Chart, lab work & pertinent test results  Airway Mallampati: I  TM Distance: >3 FB Neck ROM: Full    Dental  (+) Dental Advisory Given, Missing, Poor Dentition,    Pulmonary sleep apnea and Continuous Positive Airway Pressure Ventilation , former smoker,    breath sounds clear to auscultation       Cardiovascular hypertension, Pt. on medications + CAD   Rhythm:Regular Rate:Normal     Neuro/Psych  Headaches, negative psych ROS   GI/Hepatic Neg liver ROS, GERD  Medicated,  Endo/Other  negative endocrine ROS  Renal/GU Renal disease     Musculoskeletal  (+) Arthritis ,   Abdominal Normal abdominal exam  (+)   Peds  Hematology  (+) anemia ,   Anesthesia Other Findings   Reproductive/Obstetrics                            Anesthesia Physical Anesthesia Plan  ASA: III  Anesthesia Plan: MAC   Post-op Pain Management:    Induction: Intravenous  PONV Risk Score and Plan: 0 and Propofol infusion  Airway Management Planned: Natural Airway and Nasal Cannula  Additional Equipment: None  Intra-op Plan:   Post-operative Plan:   Informed Consent: I have reviewed the patients History and Physical, chart, labs and discussed the procedure including the risks, benefits and alternatives for the proposed anesthesia with the patient or authorized representative who has indicated his/her understanding and acceptance.       Plan Discussed with: CRNA  Anesthesia Plan Comments: (Echo:  1. Left ventricular ejection fraction, by estimation, is 55 to 60%. The  left ventricle has normal function. The left ventricle has no regional  wall motion abnormalities. There is mild left ventricular hypertrophy of  the basal-septal segment. Left  ventricular  diastolic parameters were normal.  2. Right ventricular systolic function is normal. The right ventricular  size is normal. There is normal pulmonary artery systolic pressure. The  estimated right ventricular systolic pressure is 40.0 mmHg.  3. Left atrial size was mildly dilated.  4. The mitral valve is normal in structure. Mild to moderate mitral valve  regurgitation. No evidence of mitral stenosis.  5. The aortic valve has been repaired/replaced. Aortic valve  regurgitation is not visualized. No aortic stenosis is present. 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 Procedure  Date: 2018. Echo findings are consistent  with normal structure and function of the aortic valve prosthesis. Aortic  valve mean gradient measures 10.8 mmHg. Aortic valve Vmax measures 2.12  m/s.  6. Aortic root/ascending aorta has been repaired/replaced.  7. The inferior vena cava is normal in size with greater than 50%  respiratory variability, suggesting right atrial pressure of 3 mmHg. )       Anesthesia Quick Evaluation

## 2020-06-01 NOTE — Progress Notes (Signed)
Pt.was able to drink 1/2 gallon of golytely.Dr.Karki was called & made aware & she said he has until 10 am to drink the golytely & to encourage pt.to drink it .

## 2020-06-01 NOTE — Anesthesia Postprocedure Evaluation (Signed)
Anesthesia Post Note  Patient: Lonnie Hansen  Procedure(s) Performed: COLONOSCOPY (N/A )     Patient location during evaluation: PACU Anesthesia Type: MAC Level of consciousness: awake and alert Pain management: pain level controlled Vital Signs Assessment: post-procedure vital signs reviewed and stable Respiratory status: spontaneous breathing, nonlabored ventilation, respiratory function stable and patient connected to nasal cannula oxygen Cardiovascular status: stable and blood pressure returned to baseline Postop Assessment: no apparent nausea or vomiting Anesthetic complications: no   No complications documented.  Last Vitals:  Vitals:   06/01/20 1410 06/01/20 1451  BP: (!) 151/97 (!) 127/98  Pulse: 86 87  Resp: 17 18  Temp:  36.8 C  SpO2: 97% 99%    Last Pain:  Vitals:   06/01/20 1451  TempSrc: Oral  PainSc:                  Effie Berkshire

## 2020-06-01 NOTE — Plan of Care (Signed)
  Problem: Clinical Measurements: Goal: Ability to maintain clinical measurements within normal limits will improve Outcome: Progressing Goal: Respiratory complications will improve Outcome: Progressing Goal: Cardiovascular complication will be avoided Outcome: Progressing   Problem: Activity: Goal: Risk for activity intolerance will decrease Outcome: Progressing   Problem: Nutrition: Goal: Adequate nutrition will be maintained Outcome: Progressing

## 2020-06-01 NOTE — Transfer of Care (Signed)
Immediate Anesthesia Transfer of Care Note  Patient: Lonnie Hansen  Procedure(s) Performed: COLONOSCOPY (N/A )  Patient Location: Endoscopy Unit  Anesthesia Type:MAC  Level of Consciousness: drowsy and patient cooperative  Airway & Oxygen Therapy: Patient Spontanous Breathing and Patient connected to nasal cannula oxygen  Post-op Assessment: Report given to RN, Post -op Vital signs reviewed and stable and Patient moving all extremities  Post vital signs: Reviewed and stable  Last Vitals:  Vitals Value Taken Time  BP    Temp    Pulse    Resp    SpO2      Last Pain:  Vitals:   06/01/20 1243  TempSrc: Temporal  PainSc: 0-No pain         Complications: No complications documented.

## 2020-06-01 NOTE — Progress Notes (Signed)
PROGRESS NOTE    Lonnie Hansen  ZJI:[id number] DOB: 05-26-1950 DOA: 05/31/2020 PCP: [name], MD    Chief Complaint  Patient presents with  . Rectal Bleeding    Brief Narrative:   Lonnie Hansen is a 70 y.o. male with medical history significant of OSA on CPAP; HTN; HLD; CAD; chronic ITP; AAA; and GI bleeding with admission from 4/29-5/4 due to hemorrhagia shock thought to be due to diverticular bleeding presenting with recurrent BRBPR.  He had EGD and flex sig.  GI consulted and he underwent repeat NM gi blood loss showing Persistent uptake in the region of the distal rectum. The overall appearance is similar to that seen on the prior exam from 05/25/2020. He underwent 1 unit of prbc transfusion over night and repeat H&H is 10.   Assessment & Plan:   Principal Problem:   GI bleed Active Problems:   Thoracic aortic aneurysm (HCC)   HTN (hypertension)   Hyperlipidemia   OSA on CPAP   Chronic ITP (idiopathic thrombocytopenia) (HCC)   Lower GI bleeding probably secondary to diverticular bleed. Patient was recently admitted and discharged and had extensive work-up with an EGD/flex sigmoidoscopy, tagged RBC scan and CT angiogram of the abdomen without any source.  He was readmitted again this time recurrent bleeding. He underwent 1 unit of PRBC transfusion.  GI consulted and is scheduled for an EGD and colonoscopy today.   Acute blood loss anemia probably from the lower GI bleed Transfuse to keep hemoglobin greater than 7. 1 unit of PRBC transfusion given Repeat hemoglobin is around 10.    Stable coronary artery disease, Pt denies any chest pain or sob.    Thoracic and AAA Recommend outpatient follow up .     OSA on CPAP.    Chronic ITP:  Low platelets and clumping of platelets seen.  Hematology consulted and recommendations given.  Platelets are around 72,000.    DVT prophylaxis: scd's Code Status: FULL CODE.  Family Communication: none at bedside.   Disposition:   Status is: Inpatient  Remains inpatient appropriate because:Ongoing diagnostic testing needed not appropriate for outpatient work up and IV treatments appropriate due to intensity of illness or inability to take PO   Dispo: The patient is from: Home              Anticipated d/c is to: pending              Patient currently is not medically stable to d/c.   Difficult to place patient No       Consultants:  Gastroenterology    Procedures: none.   Antimicrobials:  None.    Subjective: Dizziness improved.   Objective: Vitals:   06/01/20 0310 06/01/20 0530 06/01/20 0758 06/01/20 0909  BP: 140/88 (!) 148/109 131/90 (!) 134/92  Pulse: 80 93 92 96  Resp: 20 18 20 14   Temp: 99.1 F (37.3 C) 98.2 F (36.8 C) 98.8 F (37.1 C)   TempSrc: Oral Oral Oral   SpO2: 99% 97% 99% 100%  Weight:      Height:        Intake/Output Summary (Last 24 hours) at 06/01/2020 1157 Last data filed at 06/01/2020 0645 Gross per 24 hour  Intake 2542.21 ml  Output 100 ml  Net 2442.21 ml   Filed Weights   05/31/20 0452  Weight: 102 kg    Examination:  General exam: Appears calm and comfortable  Respiratory system: Clear to auscultation. Respiratory effort normal. Cardiovascular system: S1 &  S2 heard, RRR. No JVD, . No pedal edema. Gastrointestinal system: Abdomen is nondistended, soft and nontender.  Normal bowel sounds heard. Central nervous system: Alert and oriented. No focal neurological deficits. Extremities: Symmetric 5 x 5 power. Skin: No rashes, lesions or ulcers Psychiatry: Mood & affect appropriate.     Data Reviewed: I have personally reviewed following labs and imaging studies  CBC: Recent Labs  Lab 05/28/20 0000 05/29/20 0248 05/31/20 0523 05/31/20 1647 06/01/20 0954  WBC 9.1 7.9 12.2* 13.9* 12.8*  NEUTROABS  --   --  8.8*  --  9.1*  HGB 10.1* 9.2* 9.9* 8.5* 10.0*  HCT 30.1* 27.7* 30.1* 25.7* 29.7*  MCV 89.3 90.2 91.8 91.8 89.2  PLT 62* 74*  PLATELET CLUMPS NOTED ON SMEAR, UNABLE TO ESTIMATE 93* 72*    Basic Metabolic Panel: Recent Labs  Lab 05/25/20 2111 05/26/20 2200 05/27/20 0500 05/28/20 0013 05/28/20 0627 05/31/20 0523 06/01/20 0954  NA 140 140 139 138 140 138 135  K 3.6 3.4* 3.5 3.5 3.6 3.2* 3.5  CL 114* 108 107 105 104 101 103  CO2 22 24 24 25 28 26 22   GLUCOSE 131* 106* 97 115* 112* 136* 125*  BUN 18 12 10 12 8 15  24*  CREATININE 1.29* 1.05 0.99 0.99 1.06 1.37* 1.22  CALCIUM 7.4* 7.6* 7.6* 8.3* 8.4* 9.4 8.2*  MG 1.8 2.0  --   --   --   --   --     GFR: Estimated Creatinine Clearance: 66.3 mL/min (by C-G formula based on SCr of 1.22 mg/dL).  Liver Function Tests: Recent Labs  Lab 05/31/20 0523  AST 39  ALT 35  ALKPHOS 43  BILITOT 0.6  PROT 6.4*  ALBUMIN 3.5    CBG: Recent Labs  Lab 05/26/20 0543 05/31/20 1654  GLUCAP 106* 152*     Recent Results (from the past 240 hour(s))  Resp Panel by RT-PCR (Flu A&B, Covid) Nasopharyngeal Swab     Status: None   Collection Time: 05/24/20  8:10 PM   Specimen: Nasopharyngeal Swab; Nasopharyngeal(NP) swabs in vial transport medium  Result Value Ref Range Status   SARS Coronavirus 2 by RT PCR NEGATIVE NEGATIVE Final    Comment: (NOTE) SARS-CoV-2 target nucleic acids are NOT DETECTED.  The SARS-CoV-2 RNA is generally detectable in upper respiratory specimens during the acute phase of infection. The lowest concentration of SARS-CoV-2 viral copies this assay can detect is 138 copies/mL. A negative result does not preclude SARS-Cov-2 infection and should not be used as the sole basis for treatment or other patient management decisions. A negative result may occur with  improper specimen collection/handling, submission of specimen other than nasopharyngeal swab, presence of viral mutation(s) within the areas targeted by this assay, and inadequate number of viral copies(<138 copies/mL). A negative result must be combined with clinical observations,  patient history, and epidemiological information. The expected result is Negative.  Fact Sheet for Patients:  EntrepreneurPulse.com.au  Fact Sheet for Healthcare Providers:  IncredibleEmployment.be  This test is no t yet approved or cleared by the [place] FDA and  has been authorized for detection and/or diagnosis of SARS-CoV-2 by FDA under an Emergency Use Authorization (EUA). This EUA will remain  in effect (meaning this test can be used) for the duration of the COVID-19 declaration under Section 564(b)(1) of the Act, 21 U.S.C.section 360bbb-3(b)(1), unless the authorization is terminated  or revoked sooner.       Influenza A by PCR NEGATIVE NEGATIVE Final  Influenza B by PCR NEGATIVE NEGATIVE Final    Comment: (NOTE) The Xpert Xpress SARS-CoV-2/FLU/RSV plus assay is intended as an aid in the diagnosis of influenza from Nasopharyngeal swab specimens and should not be used as a sole basis for treatment. Nasal washings and aspirates are unacceptable for Xpert Xpress SARS-CoV-2/FLU/RSV testing.  Fact Sheet for Patients: EntrepreneurPulse.com.au  Fact Sheet for Healthcare Providers: IncredibleEmployment.be  This test is not yet approved or cleared by the [place] FDA and has been authorized for detection and/or diagnosis of SARS-CoV-2 by FDA under an Emergency Use Authorization (EUA). This EUA will remain in effect (meaning this test can be used) for the duration of the COVID-19 declaration under Section 564(b)(1) of the Act, 21 U.S.C. section 360bbb-3(b)(1), unless the authorization is terminated or revoked.  Performed at [facility], [place] [address], [place], [place] [address]   MRSA PCR Screening     Status: None   Collection Time: 05/25/20  3:32 AM   Specimen: Nasopharyngeal  Result Value Ref Range Status   MRSA by PCR NEGATIVE NEGATIVE Final    Comment:        The GeneXpert  MRSA Assay (FDA approved for NASAL specimens only), is one component of a comprehensive MRSA colonization surveillance program. It is not intended to diagnose MRSA infection nor to guide or monitor treatment for MRSA infections. Performed at [facility], [place] [address] [place], [place] [address]   SARS CORONAVIRUS 2 (TAT 6-24 HRS) Nasopharyngeal Nasopharyngeal Swab     Status: None   Collection Time: 05/31/20  7:04 AM   Specimen: Nasopharyngeal Swab  Result Value Ref Range Status   SARS Coronavirus 2 NEGATIVE NEGATIVE Final    Comment: (NOTE) SARS-CoV-2 target nucleic acids are NOT DETECTED.  The SARS-CoV-2 RNA is generally detectable in upper and lower respiratory specimens during the acute phase of infection. Negative results do not preclude SARS-CoV-2 infection, do not rule out co-infections with other pathogens, and should not be used as the sole basis for treatment or other patient management decisions. Negative results must be combined with clinical observations, patient history, and epidemiological information. The expected result is Negative.  Fact Sheet for Patients: SugarRoll.be  Fact Sheet for Healthcare Providers: https://www.woods-mathews.com/  This test is not yet approved or cleared by the [place] FDA and  has been authorized for detection and/or diagnosis of SARS-CoV-2 by FDA under an Emergency Use Authorization (EUA). This EUA will remain  in effect (meaning this test can be used) for the duration of the COVID-19 declaration under Se ction 564(b)(1) of the Act, 21 U.S.C. section 360bbb-3(b)(1), unless the authorization is terminated or revoked sooner.  Performed at [facility], [place] [address] [place], [place] [address]          Radiology Studies: NM GI Blood Loss  Result Date: 05/31/2020 CLINICAL DATA:  Persistent GI bleeding EXAM: NUCLEAR MEDICINE GASTROINTESTINAL BLEEDING SCAN  TECHNIQUE: Sequential abdominal images were obtained following intravenous administration of Tc-82m labeled red blood cells. RADIOPHARMACEUTICALS:  Twenty-five mCi Tc-56m pertechnetate in-vitro labeled red cells. COMPARISON:  05/25/2020, CT from earlier in the same day. FINDINGS: Adequate uptake is noted throughout the liver and spleen. Vascular structures are well visualized. Bladder shows normal opacification. There remains some increased activity identified in the expected region of the rectum although no mobility of this activity is seen. The overall appearance is similar to that seen on the prior exam. IMPRESSION: Persistent uptake in the region of the distal rectum.  The overall appearance is similar to that seen on the prior exam from 05/25/2020. Distal rectal bleeding could not be totally excluded. No other GI bleeding is seen. Electronically Signed   By: Inez Catalina M.D.   On: 05/31/2020 22:27   CT Angio Abd/Pel W and/or Wo Contrast  Result Date: 05/31/2020 CLINICAL DATA:  Rectal bleeding EXAM: CTA ABDOMEN AND PELVIS WITHOUT AND WITH CONTRAST TECHNIQUE: Multidetector CT imaging of the abdomen and pelvis was performed using the standard protocol during bolus administration of intravenous contrast. Multiplanar reconstructed images and MIPs were obtained and reviewed to evaluate the vascular anatomy. CONTRAST:  148mL OMNIPAQUE IOHEXOL 350 MG/ML SOLN COMPARISON:  Recent CT a abdomen/pelvis 05/25/2020 FINDINGS: VASCULAR Aorta: Stable fusiform aneurysmal dilation of the infrarenal abdominal aorta with a maximal diameter of 4.8 cm. No change compared to recent prior imaging. Mild calcified atherosclerotic plaque. Wall adherent mural thrombus within the aneurysmal segment. Celiac: Patent without evidence of aneurysm, dissection, vasculitis or significant stenosis. Lateral segmental branch of the left hepatic artery is replaced to the left gastric artery. SMA: Patent without evidence of aneurysm, dissection,  vasculitis or significant stenosis. Renals: Both main renal arteries are patent without evidence of aneurysm, dissection, vasculitis, fibromuscular dysplasia or significant stenosis. Small accessory artery to the left lower pole. IMA: Patent without evidence of aneurysm, dissection, vasculitis or significant stenosis. 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 Chronic non flow limiting dissection flap also present along the right common iliac artery secondary to a penetrating atherosclerotic ulcer. No aneurysmal dilation. 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 Aneurysmal dilation of the right internal iliac artery up to 3.0 cm, unchanged. The external iliac arteries are relatively spared from disease. Proximal Outflow: Bilateral common femoral and visualized portions of the superficial and profunda femoral arteries are patent without evidence of aneurysm, dissection, vasculitis or significant stenosis. Veins: No focal venous abnormality. Review of the MIP images confirms the above findings. NON-VASCULAR Lower chest: No acute abnormality. Hepatobiliary: Large gallstone without evidence of gallbladder wall thickening or inflammation. Normal hepatic contour morphology. Stable circumscribed low-attenuation lesions consistent with small cysts or biliary hamartomas. No solid lesion identified. No biliary ductal dilatation. Pancreas: Unremarkable. No pancreatic ductal dilatation or surrounding inflammatory changes. Spleen: Normal in size without focal abnormality. Adrenals/Urinary Tract: Normal adrenal glands. Stable bilateral renal cysts and parapelvic renal sinus cysts of varying complexity without interval change compared to recent prior imaging. Stomach/Bowel: Colonic diverticular disease without CT evidence of active inflammation. No evidence of obstruction or focal bowel wall  thickening. Normal appendix in the right lower quadrant. The terminal ileum is unremarkable. Lymphatic: No suspicious lymphadenopathy. Reproductive: Prostate is unremarkable. Other: Fat containing umbilical hernia. No inguinal hernia. No ascites. Musculoskeletal: No acute fracture or aggressive appearing lytic or blastic osseous lesion. Multilevel degenerative disc disease. IMPRESSION: VASCULAR 1. No evidence of active gastrointestinal bleeding at this time. 2. Stable infrarenal abdominal aortic, left common iliac, left anterior division internal iliac and right internal iliac artery aneurysms without change compared to recent prior imaging. Recommend follow-up every 6 months and vascular consultation. This recommendation follows ACR consensus guidelines: White Paper of the ACR Incidental Findings Committee II on Vascular Findings. J Am Coll Radiol 2013; 10:789-794. NON-VASCULAR 1. No acute abnormality within the abdomen or pelvis. 2. Colonic diverticular disease without CT evidence of active inflammation. 3. Additional ancillary findings as above without significant  interval change. Aortic Atherosclerosis (ICD10-I70.0); Aortic aneurysm NOS (ICD10-I71.9). Electronically Signed   By: Jacqulynn Cadet M.D.   On: 05/31/2020 09:36        Scheduled Meds: . sodium chloride   Intravenous Once  . amLODipine  10 mg Oral Q1200  . montelukast  10 mg Oral Q1200  . pantoprazole (PROTONIX) IV  40 mg Intravenous Q12H  . rosuvastatin  10 mg Oral q1800  . sodium chloride flush  3 mL Intravenous Q12H   Continuous Infusions: . sodium chloride    . lactated ringers 100 mL/hr at 06/01/20 0548     LOS: 1 day        [name], MD [facility]   To contact the attending provider between 7A-7P or the covering provider during after hours 7P-7A, please log into the web site [email or web address] and access using universal [place] password for that web site. If you do not have the password, please call the  hospital operator.  06/01/2020, 11:57 AM

## 2020-06-01 NOTE — Interval H&P Note (Signed)
History and Physical Interval Note: 69/male with hematochezia for colonoscopy and possible EGD if needed.  06/01/2020 1:01 PM  [name]  has presented today for colonscopy, with the diagnosis of rectal bleeding.  The various methods of treatment have been discussed with the patient and family. After consideration of risks, benefits and other options for treatment, the patient has consented to  Procedure(s): ESOPHAGOGASTRODUODENOSCOPY (EGD) (N/A) COLONOSCOPY (N/A) as a surgical intervention.  The patient's history has been reviewed, patient examined, no change in status, stable for surgery.  I have reviewed the patient's chart and labs.  Questions were answered to the patient's satisfaction.     Ronnette Juniper

## 2020-06-01 NOTE — Brief Op Note (Signed)
05/31/2020 - 06/01/2020  1:56 PM  PATIENT:  [name]  70 y.o. male  PRE-OPERATIVE DIAGNOSIS:  rectal bleeding  POST-OPERATIVE DIAGNOSIS:  Diverticulosis, no active bleeding, large hemorrhoids, old blood in colon  PROCEDURE:  Procedure(s): COLONOSCOPY (N/A)  SURGEON:  Surgeon(s) and Role:    Ronnette Juniper, MD - Primary  PHYSICIAN ASSISTANT:   ASSISTANTS: Dulcy Fanny RN, Hope Parker,Tech  ANESTHESIA:   MAC  EBL:  Minimal  BLOOD ADMINISTERED:none  DRAINS: none   LOCAL MEDICATIONS USED:  NONE  SPECIMEN:  No Specimen  DISPOSITION OF SPECIMEN:  N/A  COUNTS:  YES  TOURNIQUET:  * No tourniquets in log *  DICTATION: .Dragon Dictation  PLAN OF CARE: Admit to inpatient   PATIENT DISPOSITION:  PACU - hemodynamically stable.   Delay start of Pharmacological VTE agent (>24hrs) due to surgical blood loss or risk of bleeding: not applicable

## 2020-06-02 DIAGNOSIS — D693 Immune thrombocytopenic purpura: Secondary | ICD-10-CM | POA: Diagnosis not present

## 2020-06-02 DIAGNOSIS — I10 Essential (primary) hypertension: Secondary | ICD-10-CM | POA: Diagnosis not present

## 2020-06-02 DIAGNOSIS — K5791 Diverticulosis of intestine, part unspecified, without perforation or abscess with bleeding: Secondary | ICD-10-CM | POA: Diagnosis not present

## 2020-06-02 LAB — CBC
HCT: 25.9 % — ABNORMAL LOW (ref 39.0–52.0)
Hemoglobin: 8.7 g/dL — ABNORMAL LOW (ref 13.0–17.0)
MCH: 30.1 pg (ref 26.0–34.0)
MCHC: 33.6 g/dL (ref 30.0–36.0)
MCV: 89.6 fL (ref 80.0–100.0)
Platelets: 69 10*3/uL — ABNORMAL LOW (ref 150–400)
RBC: 2.89 MIL/uL — ABNORMAL LOW (ref 4.22–5.81)
RDW: 17 % — ABNORMAL HIGH (ref 11.5–15.5)
WBC: 8.9 10*3/uL (ref 4.0–10.5)
nRBC: 0 % (ref 0.0–0.2)

## 2020-06-02 NOTE — Progress Notes (Signed)
PROGRESS NOTE    Lonnie Hansen  LFY:[id number] DOB: 07/27/1950 DOA: 05/31/2020 PCP: [name], MD    Chief Complaint  Patient presents with  . Rectal Bleeding    Brief Narrative:   Lonnie Hansen is a 70 y.o. male with medical history significant of OSA on CPAP; HTN; HLD; CAD; chronic ITP; AAA; and GI bleeding with admission from 4/29-5/4 due to hemorrhagia shock thought to be due to diverticular bleeding presenting with recurrent BRBPR.  He had EGD and flex sig.  GI consulted and he underwent repeat NM gi blood loss showing Persistent uptake in the region of the distal rectum. The overall appearance is similar to that seen on the prior exam from 05/25/2020. He underwent 1 unit of prbc transfusion , hemoglobin is around 8.7 today.  Pt underwent colonoscopy, which showed . Blood in the entire examined colon.  Diverticulosis in the sigmoid colon, in the descending colon and in the transverse colon. Non-bleeding internal hemorrhoids. Advance diet as tolerated. No bleeding seen .   Assessment & Plan:   Principal Problem:   GI bleed Active Problems:   Thoracic aortic aneurysm (HCC)   HTN (hypertension)   Hyperlipidemia   OSA on CPAP   Chronic ITP (idiopathic thrombocytopenia) (HCC)   Lower GI bleeding probably secondary to diverticular bleed. Patient was recently admitted and discharged and had extensive work-up with an EGD/flex sigmoidoscopy, tagged RBC scan and CT angiogram of the abdomen without any source.  He was readmitted again this time recurrent bleeding. He underwent 1 unit of PRBC transfusion.  GI consulted and underwent Colonoscopy which showed Blood in the entire examined colon.  Diverticulosis in the sigmoid colon, in the descending colon and in the transverse colon.  Non-bleeding internal hemorrhoids.  No bleeding today.  Advance diet slowly.    Acute blood loss anemia probably from the lower GI bleed Transfuse to keep hemoglobin greater than 7. 1 unit of  PRBC transfusion given Repeat hemoglobin post transfusion is 10,to 9 to 8.7.     Stable coronary artery disease, Pt denies any chest pain or sob.    Thoracic and AAA Recommend outpatient follow up .     OSA on CPAP.    Chronic ITP:  Low platelets and clumping of platelets seen.  Hematology consulted and recommendations given.  Platelets are around 69,000.    DVT prophylaxis: scd's Code Status: FULL CODE.  Family Communication: none at bedside.  Disposition:   Status is: Inpatient  Remains inpatient appropriate because:Ongoing diagnostic testing needed not appropriate for outpatient work up and IV treatments appropriate due to intensity of illness or inability to take PO   Dispo: The patient is from: Home              Anticipated d/c is to: pending              Patient currently is not medically stable to d/c.   Difficult to place patient No       Consultants:  Gastroenterology   Procedures:  Colonoscopy Blood in the entire examined colon. - Diverticulosis in the sigmoid colon, in the descending colon and in the transverse colon.  - Non-bleeding internal hemorrhoids   Antimicrobials:  None.    Subjective:  no bleeding seen today.   Objective: Vitals:   06/02/20 0007 06/02/20 0024 06/02/20 0422 06/02/20 0800  BP:  127/82 115/79   Pulse:  91 81 77  Resp:   16   Temp:   99.2 F (37.3  C)   TempSrc: Oral  Oral   SpO2:  97% 100% 96%  Weight:      Height:        Intake/Output Summary (Last 24 hours) at 06/02/2020 1811 Last data filed at 06/02/2020 0424 Gross per 24 hour  Intake --  Output 2250 ml  Net -2250 ml   Filed Weights   05/31/20 0452  Weight: 102 kg    Examination:  General exam: Alert and comfortable.  Respiratory system: air entry fair, no wheezing heard.  Cardiovascular system: S1 & S2 heard. RRR no JVD.  Gastrointestinal system: Abdomen is soft, NT ND BS+ Central nervous system: alert and oriented, non focal.  Extremities: no  pedal edema.  Skin: no rashes Psychiatry: Mood is appropriate.    Data Reviewed: I have personally reviewed following labs and imaging studies  CBC: Recent Labs  Lab 05/31/20 0523 05/31/20 1647 06/01/20 0954 06/01/20 1620 06/02/20 0612  WBC 12.2* 13.9* 12.8* 10.9* 8.9  NEUTROABS 8.8*  --  9.1*  --   --   HGB 9.9* 8.5* 10.0* 9.0* 8.7*  HCT 30.1* 25.7* 29.7* 26.9* 25.9*  MCV 91.8 91.8 89.2 87.9 89.6  PLT PLATELET CLUMPS NOTED ON SMEAR, UNABLE TO ESTIMATE 93* 72* 70* 69*    Basic Metabolic Panel: Recent Labs  Lab 05/26/20 2200 05/27/20 0500 05/28/20 0013 05/28/20 0627 05/31/20 0523 06/01/20 0954  NA 140 139 138 140 138 135  K 3.4* 3.5 3.5 3.6 3.2* 3.5  CL 108 107 105 104 101 103  CO2 24 24 25 28 26 22   GLUCOSE 106* 97 115* 112* 136* 125*  BUN 12 10 12 8 15  24*  CREATININE 1.05 0.99 0.99 1.06 1.37* 1.22  CALCIUM 7.6* 7.6* 8.3* 8.4* 9.4 8.2*  MG 2.0  --   --   --   --   --     GFR: Estimated Creatinine Clearance: 66.3 mL/min (by C-G formula based on SCr of 1.22 mg/dL).  Liver Function Tests: Recent Labs  Lab 05/31/20 0523  AST 39  ALT 35  ALKPHOS 43  BILITOT 0.6  PROT 6.4*  ALBUMIN 3.5    CBG: Recent Labs  Lab 05/31/20 1654  GLUCAP 152*     Recent Results (from the past 240 hour(s))  Resp Panel by RT-PCR (Flu A&B, Covid) Nasopharyngeal Swab     Status: None   Collection Time: 05/24/20  8:10 PM   Specimen: Nasopharyngeal Swab; Nasopharyngeal(NP) swabs in vial transport medium  Result Value Ref Range Status   SARS Coronavirus 2 by RT PCR NEGATIVE NEGATIVE Final    Comment: (NOTE) SARS-CoV-2 target nucleic acids are NOT DETECTED.  The SARS-CoV-2 RNA is generally detectable in upper respiratory specimens during the acute phase of infection. The lowest concentration of SARS-CoV-2 viral copies this assay can detect is 138 copies/mL. A negative result does not preclude SARS-Cov-2 infection and should not be used as the sole basis for treatment  or other patient management decisions. A negative result may occur with  improper specimen collection/handling, submission of specimen other than nasopharyngeal swab, presence of viral mutation(s) within the areas targeted by this assay, and inadequate number of viral copies(<138 copies/mL). A negative result must be combined with clinical observations, patient history, and epidemiological information. The expected result is Negative.  Fact Sheet for Patients:  EntrepreneurPulse.com.au  Fact Sheet for Healthcare Providers:  IncredibleEmployment.be  This test is no t yet approved or cleared by the [place] FDA and  has been authorized for detection  and/or diagnosis of SARS-CoV-2 by FDA under an Emergency Use Authorization (EUA). This EUA will remain  in effect (meaning this test can be used) for the duration of the COVID-19 declaration under Section 564(b)(1) of the Act, 21 U.S.C.section 360bbb-3(b)(1), unless the authorization is terminated  or revoked sooner.       Influenza A by PCR NEGATIVE NEGATIVE Final   Influenza B by PCR NEGATIVE NEGATIVE Final    Comment: (NOTE) The Xpert Xpress SARS-CoV-2/FLU/RSV plus assay is intended as an aid in the diagnosis of influenza from Nasopharyngeal swab specimens and should not be used as a sole basis for treatment. Nasal washings and aspirates are unacceptable for Xpert Xpress SARS-CoV-2/FLU/RSV testing.  Fact Sheet for Patients: EntrepreneurPulse.com.au  Fact Sheet for Healthcare Providers: IncredibleEmployment.be  This test is not yet approved or cleared by the [place] FDA and has been authorized for detection and/or diagnosis of SARS-CoV-2 by FDA under an Emergency Use Authorization (EUA). This EUA will remain in effect (meaning this test can be used) for the duration of the COVID-19 declaration under Section 564(b)(1) of the Act, 21 U.S.C. section  360bbb-3(b)(1), unless the authorization is terminated or revoked.  Performed at [facility], [place] [address], [place], [place] [address]   MRSA PCR Screening     Status: None   Collection Time: 05/25/20  3:32 AM   Specimen: Nasopharyngeal  Result Value Ref Range Status   MRSA by PCR NEGATIVE NEGATIVE Final    Comment:        The GeneXpert MRSA Assay (FDA approved for NASAL specimens only), is one component of a comprehensive MRSA colonization surveillance program. It is not intended to diagnose MRSA infection nor to guide or monitor treatment for MRSA infections. Performed at [facility], [place] [address] [place], [place] [address]   SARS CORONAVIRUS 2 (TAT 6-24 HRS) Nasopharyngeal Nasopharyngeal Swab     Status: None   Collection Time: 05/31/20  7:04 AM   Specimen: Nasopharyngeal Swab  Result Value Ref Range Status   SARS Coronavirus 2 NEGATIVE NEGATIVE Final    Comment: (NOTE) SARS-CoV-2 target nucleic acids are NOT DETECTED.  The SARS-CoV-2 RNA is generally detectable in upper and lower respiratory specimens during the acute phase of infection. Negative results do not preclude SARS-CoV-2 infection, do not rule out co-infections with other pathogens, and should not be used as the sole basis for treatment or other patient management decisions. Negative results must be combined with clinical observations, patient history, and epidemiological information. The expected result is Negative.  Fact Sheet for Patients: SugarRoll.be  Fact Sheet for Healthcare Providers: https://www.woods-mathews.com/  This test is not yet approved or cleared by the [place] FDA and  has been authorized for detection and/or diagnosis of SARS-CoV-2 by FDA under an Emergency Use Authorization (EUA). This EUA will remain  in effect (meaning this test can be used) for the duration of the COVID-19 declaration under Se ction 564(b)(1) of the  Act, 21 U.S.C. section 360bbb-3(b)(1), unless the authorization is terminated or revoked sooner.  Performed at [facility], [place] [address], [place], [place] [address]          Radiology Studies: NM GI Blood Loss  Result Date: 05/31/2020 CLINICAL DATA:  Persistent GI bleeding EXAM: NUCLEAR MEDICINE GASTROINTESTINAL BLEEDING SCAN TECHNIQUE: Sequential abdominal images were obtained following intravenous administration of Tc-57m labeled red blood cells. RADIOPHARMACEUTICALS:  Twenty-five mCi Tc-74m pertechnetate in-vitro labeled red cells. COMPARISON:  05/25/2020, CT from earlier in  the same day. FINDINGS: Adequate uptake is noted throughout the liver and spleen. Vascular structures are well visualized. Bladder shows normal opacification. There remains some increased activity identified in the expected region of the rectum although no mobility of this activity is seen. The overall appearance is similar to that seen on the prior exam. IMPRESSION: Persistent uptake in the region of the distal rectum. The overall appearance is similar to that seen on the prior exam from 05/25/2020. Distal rectal bleeding could not be totally excluded. No other GI bleeding is seen. Electronically Signed   By: Inez Catalina M.D.   On: 05/31/2020 22:27        Scheduled Meds: . sodium chloride   Intravenous Once  . amLODipine  10 mg Oral Q1200  . montelukast  10 mg Oral Q1200  . pantoprazole (PROTONIX) IV  40 mg Intravenous Q24H  . polyethylene glycol  17 g Oral Daily  . psyllium  1 packet Oral Daily  . rosuvastatin  10 mg Oral q1800  . sodium chloride flush  3 mL Intravenous Q12H   Continuous Infusions:    LOS: 2 days        [name], MD [facility]   To contact the attending provider between 7A-7P or the covering provider during after hours 7P-7A, please log into the web site [email or web address] and access using universal [place] password for that web site. If you do not have the  password, please call the hospital operator.  06/02/2020, 6:11 PM

## 2020-06-02 NOTE — Progress Notes (Addendum)
I spoke with the patient over the phone. He states he has not had any bowel movements. Denies further episodes of rectal bleeding since colonoscopy yesterday. He denies abdominal pain. He was able to eat some eggs today.  He is concerned about recurrent diverticular bleed. I discussed in details about unpredictability of recurrence of diverticular bleed and reassured him that at the moment bleeding seems to have resolved. I did emphasize for him to take a high-fiber diet on a regular basis, increase intake of fruits, vegetables, whole grains, increase intake of water, and using fiber supplement such as Benefiber/Metamucil/Citrucel on a regular basis at least 1-2 times a day. If no further bleeding noted by tomorrow morning, and hemoglobin remains stable, okay to DC patient home in a.m.Marland Kitchen Please recall GI if needed.

## 2020-06-03 ENCOUNTER — Encounter (HOSPITAL_COMMUNITY): Payer: Self-pay | Admitting: Gastroenterology

## 2020-06-03 DIAGNOSIS — D693 Immune thrombocytopenic purpura: Secondary | ICD-10-CM | POA: Diagnosis not present

## 2020-06-03 DIAGNOSIS — I10 Essential (primary) hypertension: Secondary | ICD-10-CM | POA: Diagnosis not present

## 2020-06-03 DIAGNOSIS — K5791 Diverticulosis of intestine, part unspecified, without perforation or abscess with bleeding: Secondary | ICD-10-CM | POA: Diagnosis not present

## 2020-06-03 LAB — BPAM RBC
Blood Product Expiration Date: 202205202359
Blood Product Expiration Date: 202205272359
Blood Product Expiration Date: 202205272359
Blood Product Expiration Date: 202205272359
Blood Product Expiration Date: 202205282359
ISSUE DATE / TIME: 202205060603
ISSUE DATE / TIME: 202205062323
ISSUE DATE / TIME: 202205070244
Unit Type and Rh: 5100
Unit Type and Rh: 6200
Unit Type and Rh: 6200
Unit Type and Rh: 6200
Unit Type and Rh: 6200

## 2020-06-03 LAB — TYPE AND SCREEN
ABO/RH(D): AB POS
Antibody Screen: POSITIVE
Unit division: 0
Unit division: 0
Unit division: 0
Unit division: 0
Unit division: 0

## 2020-06-03 LAB — CBC WITH DIFFERENTIAL/PLATELET
Abs Immature Granulocytes: 0.12 10*3/uL — ABNORMAL HIGH (ref 0.00–0.07)
Basophils Absolute: 0 10*3/uL (ref 0.0–0.1)
Basophils Relative: 0 %
Eosinophils Absolute: 0.2 10*3/uL (ref 0.0–0.5)
Eosinophils Relative: 2 %
HCT: 26.4 % — ABNORMAL LOW (ref 39.0–52.0)
Hemoglobin: 8.8 g/dL — ABNORMAL LOW (ref 13.0–17.0)
Immature Granulocytes: 1 %
Lymphocytes Relative: 11 %
Lymphs Abs: 1 10*3/uL (ref 0.7–4.0)
MCH: 30.6 pg (ref 26.0–34.0)
MCHC: 33.3 g/dL (ref 30.0–36.0)
MCV: 91.7 fL (ref 80.0–100.0)
Monocytes Absolute: 1.1 10*3/uL — ABNORMAL HIGH (ref 0.1–1.0)
Monocytes Relative: 12 %
Neutro Abs: 6.6 10*3/uL (ref 1.7–7.7)
Neutrophils Relative %: 74 %
Platelets: 80 10*3/uL — ABNORMAL LOW (ref 150–400)
RBC: 2.88 MIL/uL — ABNORMAL LOW (ref 4.22–5.81)
RDW: 17.1 % — ABNORMAL HIGH (ref 11.5–15.5)
WBC: 8.9 10*3/uL (ref 4.0–10.5)
nRBC: 0 % (ref 0.0–0.2)

## 2020-06-03 LAB — VON WILLEBRAND PANEL
Coagulation Factor VIII: 168 % — ABNORMAL HIGH (ref 56–140)
Ristocetin Co-factor, Plasma: 150 % (ref 50–200)
Von Willebrand Antigen, Plasma: 296 % — ABNORMAL HIGH (ref 50–200)

## 2020-06-03 LAB — URINE CULTURE: Culture: <10000 — AB

## 2020-06-03 LAB — COAG STUDIES INTERP REPORT

## 2020-06-03 MED ORDER — PANTOPRAZOLE SODIUM 40 MG PO TBEC: 40.0000 mg | DELAYED_RELEASE_TABLET | Freq: Every day | ORAL | Status: DC

## 2020-06-03 MED ORDER — FLUTICASONE PROPIONATE 50 MCG/ACT NA SUSP
1.0000 | Freq: Every day | NASAL | Status: DC
  Filled 2020-06-03: qty 16

## 2020-06-03 MED ORDER — PHENOL 1.4 % MT LIQD: 1.0000 | OROMUCOSAL | Status: DC | PRN

## 2020-06-03 MED ORDER — PSYLLIUM 95 % PO PACK: 1.0000 | PACK | Freq: Every day | ORAL | 3 refills | Status: DC

## 2020-06-03 MED ORDER — POLYETHYLENE GLYCOL 3350 17 G PO PACK: 17.0000 g | PACK | Freq: Every day | ORAL | 0 refills | Status: DC

## 2020-06-03 NOTE — Discharge Instructions (Signed)

## 2020-06-03 NOTE — Evaluation (Signed)
Physical Therapy Evaluation Patient Details Name: Lonnie Hansen MRN: [id number] DOB: 02-08-1950 Today's Date: 06/03/2020   History of Present Illness  Lonnie Hansen is a 70 y.o. male with medical history significant of OSA on CPAP; HTN; HLD; CAD; chronic ITP; AAA; and GI bleeding with admission from 4/29-5/4 due to hemorrhagia shock thought to be due to diverticular bleeding presenting with recurrent BRBPR.  He had EGD and flex sig.   GI consulted and he underwent repeat NM gi blood loss showing Persistent uptake in the region of the distal rectum. The overall appearance is similar to that seen on the prior exam from 05/25/2020.  Clinical Impression  Patient received in bed talking with RN. Patient agreeable to PT session. He is mod independent with bed mobility and transfers. He ambulated 200 feet with RW and supervision. No lob or significant difficulty noted or reported. Patient will continue to benefit from skilled PT while here to improve activity tolerance for return home.      Follow Up Recommendations No PT follow up    Equipment Recommendations  None recommended by PT    Recommendations for Other Services       Precautions / Restrictions Precautions Precaution Comments: bad right hip, mod fall Restrictions Weight Bearing Restrictions: No      Mobility  Bed Mobility Overal bed mobility: Modified Independent             General bed mobility comments: no physical assist needed    Transfers Overall transfer level: Modified independent Equipment used: Rolling walker (2 wheeled)                Ambulation/Gait Ambulation/Gait assistance: Modified independent (Device/Increase time) Gait Distance (Feet): 200 Feet Assistive device: Rolling walker (2 wheeled) Gait Pattern/deviations: Step-through pattern;Decreased stride length;Decreased stance time - right;Antalgic Gait velocity: decreased   General Gait Details: pt with antalgic gait which he reports as  baseline with HR 120s  Stairs            Wheelchair Mobility    Modified Rankin (Stroke Patients Only)       Balance Overall balance assessment: Modified Independent                                           Pertinent Vitals/Pain Pain Assessment: No/denies pain    Home Living Family/patient expects to be discharged to:: Private residence Living Arrangements: Spouse/significant other Available Help at Discharge: Family;Available 24 hours/day Type of Home: House Home Access: Stairs to enter Entrance Stairs-Rails: Right Entrance Stairs-Number of Steps: 3 Home Layout: Two level;Able to live on main level with bedroom/bathroom Home Equipment: Bedside commode;Cane - single point;Walker - 4 wheels      Prior Function Level of Independence: Independent with assistive device(s)         Comments: 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     Hand Dominance   Dominant Hand: Right    Extremity/Trunk Assessment   Upper Extremity Assessment Upper Extremity Assessment: Overall WFL for tasks assessed    Lower Extremity Assessment Lower Extremity Assessment: Overall WFL for tasks assessed RLE Deficits / Details: baseline Rt hip dysfunction    Cervical / Trunk Assessment Cervical / Trunk Assessment: Normal  Communication   Communication: No difficulties  Cognition Arousal/Alertness: Awake/alert Behavior During Therapy: WFL for tasks assessed/performed Overall Cognitive Status: Within Functional Limits for tasks assessed  General Comments: very talktaive      General Comments      Exercises     Assessment/Plan    PT Assessment Patient needs continued PT services  PT Problem List Decreased mobility       PT Treatment Interventions Gait training;Functional mobility training;Therapeutic activities;Therapeutic exercise    PT Goals (Current goals can be found in the Care Plan section)  Acute Rehab PT  Goals Patient Stated Goal: to go home PT Goal Formulation: With patient Time For Goal Achievement: 06/10/20 Potential to Achieve Goals: Good    Frequency Min 2X/week   Barriers to discharge        Co-evaluation               AM-PAC PT "6 Clicks" Mobility  Outcome Measure Help needed turning from your back to your side while in a flat bed without using bedrails?: None Help needed moving from lying on your back to sitting on the side of a flat bed without using bedrails?: None Help needed moving to and from a bed to a chair (including a wheelchair)?: None Help needed standing up from a chair using your arms (e.g., wheelchair or bedside chair)?: None Help needed to walk in hospital room?: A Little Help needed climbing 3-5 steps with a railing? : A Little 6 Click Score: 22    End of Session Equipment Utilized During Treatment: Gait belt Activity Tolerance: Patient tolerated treatment well Patient left: in bed;with call bell/phone within reach Nurse Communication: Mobility status PT Visit Diagnosis: Other abnormalities of gait and mobility (R26.89)    Time: 1336-1405 PT Time Calculation (min) (ACUTE ONLY): 29 min   Charges:   PT Evaluation $PT Eval Moderate Complexity: 1 Mod PT Treatments $Gait Training: 8-22 mins        Fayelynn Distel, PT, GCS 06/03/20,2:26 PM

## 2020-06-03 NOTE — Consult Note (Signed)
   [facility] CM Inpatient Consult   06/03/2020  Lonnie Hansen Aug 03, 1950 [id number]   [facility] [ACO] Patient: Medicare CMS DCE  Less than 7 days readmission  Patient is active with a  [place] for post hospital transition.  Review of patient's medical record reveals patient readmitted with active GI Bleeding.  Plan:  Will continue to follow progress with inpatient stay and disposition to assess for post hospital care management needs.    For questions contact:   Natividad Brood, RN BSN [facility] Liaison  [phone] business mobile phone Toll free office [phone]  Fax number: [phone] [name]

## 2020-06-03 NOTE — Progress Notes (Signed)
PROGRESS NOTE    Lonnie Hansen  XHB:[id number] DOB: 01/05/1951 DOA: 05/31/2020 PCP: [name], MD    Chief Complaint  Patient presents with  . Rectal Bleeding    Brief Narrative:   Lonnie Hansen is a 70 y.o. male with medical history significant of OSA on CPAP; HTN; HLD; CAD; chronic ITP; AAA; and GI bleeding with admission from 4/29-5/4 due to hemorrhagia shock thought to be due to diverticular bleeding presenting with recurrent BRBPR.  He had EGD and flex sig.  GI consulted and he underwent repeat NM gi blood loss showing Persistent uptake in the region of the distal rectum. The overall appearance is similar to that seen on the prior exam from 05/25/2020. He underwent 1 unit of prbc transfusion , repeat hemoglobin staying stable around 8.  Pt underwent colonoscopy, which showed . Blood in the entire examined colon.  Diverticulosis in the sigmoid colon, in the descending colon and in the transverse colon. Non-bleeding internal hemorrhoids. Advance diet as tolerated. No bleeding seen . Advanced to regular diet today. Pt seen and examined today, pt reports he wants to have a regular bowel movement without any blood prior to going home.    Assessment & Plan:   Principal Problem:   GI bleed Active Problems:   Thoracic aortic aneurysm (HCC)   HTN (hypertension)   Hyperlipidemia   OSA on CPAP   Chronic ITP (idiopathic thrombocytopenia) (HCC)   Lower GI bleeding probably secondary to diverticular bleed. Patient was recently admitted and discharged and had extensive work-up with an EGD/flex sigmoidoscopy, tagged RBC scan and CT angiogram of the abdomen without any source.  He was readmitted again this time for  recurrent bleeding. He underwent 1 unit of PRBC transfusion.   GI consulted and underwent Colonoscopy which showed Blood in the entire examined colon.Diverticulosis in the sigmoid colon, in the descending colon and in the transverse colon. Non-bleeding internal  hemorrhoids.  Started on clears and advanced to regular diet today.  Plan to check for BM without any blood.    Acute blood loss anemia probably from the lower GI bleed Transfuse to keep hemoglobin greater than 7. 1 unit of PRBC transfusion given Repeat hemoglobin post transfusion is 10,to 9 to 8.7 to 8.8.     Stable coronary artery disease, Pt denies any chest pain or sob.    Thoracic and AAA Recommend outpatient follow up .     OSA on CPAP.    Chronic ITP:  Low platelets and clumping of platelets seen.  Hematology consulted and recommendations given.  Platelets are around 80,000.   Leukocytosis:  Resolved. probably reactive.     Hypertension;  Well controlled.     Mild AKI:  Resolved.   Hyperlipidemia:  Continue with Crestor.   Hypokalemia replaced.     Therapy evaluations pending prior to discharge.    DVT prophylaxis: scd's Code Status: FULL CODE.  Family Communication: none at bedside.  Disposition:   Status is: Inpatient  Remains inpatient appropriate because:Ongoing diagnostic testing needed not appropriate for outpatient work up and IV treatments appropriate due to intensity of illness or inability to take PO   Dispo: The patient is from: Home              Anticipated d/c is to: pending              Patient currently is not medically stable to d/c.   Difficult to place patient No       Consultants:  Gastroenterology   Procedures:  Colonoscopy Blood in the entire examined colon. - Diverticulosis in the sigmoid colon, in the descending colon and in the transverse colon.  - Non-bleeding internal hemorrhoids   Antimicrobials:  None.    Subjective: No BM yet, no nausea, vomiting or abdominal pain.   Objective: Vitals:   06/02/20 0800 06/02/20 1959 06/03/20 0350 06/03/20 1023  BP:  121/86 128/88 118/72  Pulse: 77 92 93 89  Resp:  18 18 18   Temp:  99.5 F (37.5 C) 99.2 F (37.3 C) 98.3 F (36.8 C)  TempSrc:  Oral Oral  Oral  SpO2: 96% 97% 98% 100%  Weight:      Height:        Intake/Output Summary (Last 24 hours) at 06/03/2020 1300 Last data filed at 06/03/2020 1200 Gross per 24 hour  Intake 360 ml  Output 1550 ml  Net -1190 ml   Filed Weights   05/31/20 0452  Weight: 102 kg    Examination:  General exam: Elderly gentleman not in any kind of distress Respiratory system: Clear to auscultation bilateral, no wheezing or rhonchi Cardiovascular system: S1-S2 heard, regular rate rhythm, no JVD Gastrointestinal system: Abdomen is soft nontender nondistended bowel sounds normal Central nervous system: Alert and oriented, grossly nonfocal Extremities: No pedal edema Skin:  no rashes seen psychiatry: Mood is appropriate.    Data Reviewed: I have personally reviewed following labs and imaging studies  CBC: Recent Labs  Lab 05/31/20 0523 05/31/20 1647 06/01/20 0954 06/01/20 1620 06/02/20 0612 06/03/20 0939  WBC 12.2* 13.9* 12.8* 10.9* 8.9 8.9  NEUTROABS 8.8*  --  9.1*  --   --  PENDING  HGB 9.9* 8.5* 10.0* 9.0* 8.7* 8.8*  HCT 30.1* 25.7* 29.7* 26.9* 25.9* 26.4*  MCV 91.8 91.8 89.2 87.9 89.6 91.7  PLT PLATELET CLUMPS NOTED ON SMEAR, UNABLE TO ESTIMATE 93* 72* 70* 69* 80*    Basic Metabolic Panel: Recent Labs  Lab 05/28/20 0013 05/28/20 0627 05/31/20 0523 06/01/20 0954  NA 138 140 138 135  K 3.5 3.6 3.2* 3.5  CL 105 104 101 103  CO2 25 28 26 22   GLUCOSE 115* 112* 136* 125*  BUN 12 8 15  24*  CREATININE 0.99 1.06 1.37* 1.22  CALCIUM 8.3* 8.4* 9.4 8.2*    GFR: Estimated Creatinine Clearance: 66.3 mL/min (by C-G formula based on SCr of 1.22 mg/dL).  Liver Function Tests: Recent Labs  Lab 05/31/20 0523  AST 39  ALT 35  ALKPHOS 43  BILITOT 0.6  PROT 6.4*  ALBUMIN 3.5    CBG: Recent Labs  Lab 05/31/20 1654  GLUCAP 152*     Recent Results (from the past 240 hour(s))  Resp Panel by RT-PCR (Flu A&B, Covid) Nasopharyngeal Swab     Status: None   Collection Time:  05/24/20  8:10 PM   Specimen: Nasopharyngeal Swab; Nasopharyngeal(NP) swabs in vial transport medium  Result Value Ref Range Status   SARS Coronavirus 2 by RT PCR NEGATIVE NEGATIVE Final    Comment: (NOTE) SARS-CoV-2 target nucleic acids are NOT DETECTED.  The SARS-CoV-2 RNA is generally detectable in upper respiratory specimens during the acute phase of infection. The lowest concentration of SARS-CoV-2 viral copies this assay can detect is 138 copies/mL. A negative result does not preclude SARS-Cov-2 infection and should not be used as the sole basis for treatment or other patient management decisions. A negative result may occur with  improper specimen collection/handling, submission of specimen other than nasopharyngeal swab, presence of  viral mutation(s) within the areas targeted by this assay, and inadequate number of viral copies(<138 copies/mL). A negative result must be combined with clinical observations, patient history, and epidemiological information. The expected result is Negative.  Fact Sheet for Patients:  EntrepreneurPulse.com.au  Fact Sheet for Healthcare Providers:  IncredibleEmployment.be  This test is no t yet approved or cleared by the [place] FDA and  has been authorized for detection and/or diagnosis of SARS-CoV-2 by FDA under an Emergency Use Authorization (EUA). This EUA will remain  in effect (meaning this test can be used) for the duration of the COVID-19 declaration under Section 564(b)(1) of the Act, 21 U.S.C.section 360bbb-3(b)(1), unless the authorization is terminated  or revoked sooner.       Influenza A by PCR NEGATIVE NEGATIVE Final   Influenza B by PCR NEGATIVE NEGATIVE Final    Comment: (NOTE) The Xpert Xpress SARS-CoV-2/FLU/RSV plus assay is intended as an aid in the diagnosis of influenza from Nasopharyngeal swab specimens and should not be used as a sole basis for treatment. Nasal washings  and aspirates are unacceptable for Xpert Xpress SARS-CoV-2/FLU/RSV testing.  Fact Sheet for Patients: EntrepreneurPulse.com.au  Fact Sheet for Healthcare Providers: IncredibleEmployment.be  This test is not yet approved or cleared by the [place] FDA and has been authorized for detection and/or diagnosis of SARS-CoV-2 by FDA under an Emergency Use Authorization (EUA). This EUA will remain in effect (meaning this test can be used) for the duration of the COVID-19 declaration under Section 564(b)(1) of the Act, 21 U.S.C. section 360bbb-3(b)(1), unless the authorization is terminated or revoked.  Performed at [facility], [place] [address], [place], [place] [address]   MRSA PCR Screening     Status: None   Collection Time: 05/25/20  3:32 AM   Specimen: Nasopharyngeal  Result Value Ref Range Status   MRSA by PCR NEGATIVE NEGATIVE Final    Comment:        The GeneXpert MRSA Assay (FDA approved for NASAL specimens only), is one component of a comprehensive MRSA colonization surveillance program. It is not intended to diagnose MRSA infection nor to guide or monitor treatment for MRSA infections. Performed at [facility], [place] [address], [place], [place] [address]   SARS CORONAVIRUS 2 (TAT 6-24 HRS) Nasopharyngeal Nasopharyngeal Swab     Status: None   Collection Time: 05/31/20  7:04 AM   Specimen: Nasopharyngeal Swab  Result Value Ref Range Status   SARS Coronavirus 2 NEGATIVE NEGATIVE Final    Comment: (NOTE) SARS-CoV-2 target nucleic acids are NOT DETECTED.  The SARS-CoV-2 RNA is generally detectable in upper and lower respiratory specimens during the acute phase of infection. Negative results do not preclude SARS-CoV-2 infection, do not rule out co-infections with other pathogens, and should not be used as the sole basis for treatment or other patient management decisions. Negative results must be combined with clinical  observations, patient history, and epidemiological information. The expected result is Negative.  Fact Sheet for Patients: SugarRoll.be  Fact Sheet for Healthcare Providers: https://www.woods-mathews.com/  This test is not yet approved or cleared by the [place] FDA and  has been authorized for detection and/or diagnosis of SARS-CoV-2 by FDA under an Emergency Use Authorization (EUA). This EUA will remain  in effect (meaning this test can be used) for the duration of the COVID-19 declaration under Se ction 564(b)(1) of the Act, 21 U.S.C. section 360bbb-3(b)(1), unless the authorization is terminated or revoked sooner.  Performed at [facility], [place] [address], [place], [place] [address]   Culture, Urine     Status: Abnormal   Collection Time: 06/01/20  9:50 PM   Specimen: Urine, Random  Result Value Ref Range Status   Specimen Description URINE, RANDOM  Final   Special Requests NONE  Final   Culture (A)  Final    <10,000 COLONIES/mL INSIGNIFICANT GROWTH Performed at [facility], [place] [address] [place], [place] [address]    Report Status 06/03/2020 FINAL  Final         Radiology Studies: No results found.      Scheduled Meds: . sodium chloride   Intravenous Once  . amLODipine  10 mg Oral Q1200  . fluticasone  1 spray Each Nare Daily  . montelukast  10 mg Oral Q1200  . pantoprazole (PROTONIX) IV  40 mg Intravenous Q24H  . polyethylene glycol  17 g Oral Daily  . psyllium  1 packet Oral Daily  . rosuvastatin  10 mg Oral q1800  . sodium chloride flush  3 mL Intravenous Q12H   Continuous Infusions:    LOS: 3 days        [name], MD [facility]   To contact the attending provider between 7A-7P or the covering provider during after hours 7P-7A, please log into the web site [email or web address] and access using universal [place] password for that web site. If you do not have the password,  please call the hospital operator.  06/03/2020, 1:00 PM

## 2020-06-04 ENCOUNTER — Telehealth: Payer: Self-pay | Admitting: Cardiology

## 2020-06-04 NOTE — Telephone Encounter (Signed)
Attempted to reach the patient. Voicemail was full.  Hospital follow up scheduled 5/27.

## 2020-06-04 NOTE — Telephone Encounter (Signed)
Patient called to get clarification on if he supposed to be take medication he stayed when he was in the hospital they took in off the medication but he dont feel like he should be off that medication.

## 2020-06-05 ENCOUNTER — Other Ambulatory Visit: Payer: Self-pay | Admitting: *Deleted

## 2020-06-05 NOTE — Patient Outreach (Signed)
[facility]) Care Management  06/05/2020  Lonnie Hansen 1950-06-04 [id number]   Notified that member discharged home yesterday after readmission for recurrent rectal bleeding.  State he is doing well, no further bleeding.  Denies any urgent concerns, encouraged to contact this care manager with questions.  Agrees to follow up within the next 2 weeks.  Goals Addressed            This Visit's Progress   . [facility] - Make and Keep All Appointments   On track    Timeframe:  Short-Term Goal Priority:  High Start Date:           5/5                  Expected End Date:    6/5                   Barriers: Knowledge    - ask family or friend for a ride - call to cancel if needed    Why is this important?    Part of staying healthy is seeing the doctor for follow-up care.   If you forget your appointments, there are some things you can do to stay on track.    Notes:   5/5 - Follow up with PCP on 5/6, GI on 5/23, and Cardioloy on 5/27  5/11 - Will see PCP again on 5/13 and oncology on 6/6    . [facility] - [facility] My Quality of Life   On track    Timeframe:  Long-Range Goal Priority:  Medium Start Date:     5/5                        Expected End Date:   7/5                    Barriers: Knowledge    - complete a living will - discuss my treatment options with the doctor or nurse    Why is this important?    Having a long-term illness can be scary.   It can also be stressful for you and your caregiver.   These steps may help.    Notes:   5/5 - Educated on signs of bleeding.  Encouraged to monitor blood pressure daily and record readings.  Blood pressure monitor sent.  5/11 - Discussed recent discharge instructions.  Call placed to PCP office to follow up on member's question about medications      [name], RN, MSN [place] Field Manager [phone]

## 2020-06-07 DIAGNOSIS — R42 Dizziness and giddiness: Secondary | ICD-10-CM | POA: Diagnosis not present

## 2020-06-07 DIAGNOSIS — I469 Cardiac arrest, cause unspecified: Secondary | ICD-10-CM | POA: Diagnosis not present

## 2020-06-07 DIAGNOSIS — D693 Immune thrombocytopenic purpura: Secondary | ICD-10-CM | POA: Diagnosis not present

## 2020-06-07 DIAGNOSIS — D649 Anemia, unspecified: Secondary | ICD-10-CM | POA: Diagnosis not present

## 2020-06-07 DIAGNOSIS — J309 Allergic rhinitis, unspecified: Secondary | ICD-10-CM | POA: Diagnosis not present

## 2020-06-07 DIAGNOSIS — K5900 Constipation, unspecified: Secondary | ICD-10-CM | POA: Diagnosis not present

## 2020-06-07 DIAGNOSIS — I712 Thoracic aortic aneurysm, without rupture: Secondary | ICD-10-CM | POA: Diagnosis not present

## 2020-06-07 DIAGNOSIS — I2510 Atherosclerotic heart disease of native coronary artery without angina pectoris: Secondary | ICD-10-CM | POA: Diagnosis not present

## 2020-06-07 DIAGNOSIS — K5791 Diverticulosis of intestine, part unspecified, without perforation or abscess with bleeding: Secondary | ICD-10-CM | POA: Diagnosis not present

## 2020-06-07 DIAGNOSIS — I10 Essential (primary) hypertension: Secondary | ICD-10-CM | POA: Diagnosis not present

## 2020-06-10 LAB — PLATELET BY CITRATE

## 2020-06-10 NOTE — Telephone Encounter (Signed)
Spoke with pt and this message is old and the question has already been addressed .Adonis Housekeeper

## 2020-06-12 NOTE — Discharge Summary (Signed)
Physician Discharge Summary  Lonnie Hansen HTD:[id number] DOB: 26-Jul-1950 DOA: 05/31/2020  PCP: [name], MD  Admit date: 05/31/2020 Discharge date: 06/03/2020  Admitted From: HOME.  Disposition:  HOME.   Recommendations for Outpatient Follow-up:  1. Follow up with PCP in 1-2 weeks 2. Please obtain BMP/CBC in one week Please follow up with gastroenterology.   Discharge Condition:stable.  CODE STATUS:FULL CODE.  Diet recommendation: Heart Healthy   Brief/Interim Summary: Lonnie Hansen a 70 y.o.malewith medical history significant ofOSA on CPAP; HTN; HLD; CAD; chronic ITP; AAA; and GI bleeding with admission from4/29-5/4 due to hemorrhagia shock thought to be due to diverticular bleeding presenting with recurrent BRBPR. He had EGD and flex sig.  GI consulted and he underwent repeat NM gi blood loss showing Persistent uptake in the region of the distal rectum. The overall appearance is similar to that seen on the prior exam from 05/25/2020. He underwent 1 unit of prbc transfusion , repeat hemoglobin staying stable around 8.  Pt underwent colonoscopy, which showed . Blood in the entire examined colon.  Diverticulosis in the sigmoid colon, in the descending colon and in the transverse colon. Non-bleeding internal hemorrhoids. Advance diet as tolerated. No bleeding seen . Advanced to regular diet today. Pt seen and examined today, pt reports he wants to have a regular bowel movement without any blood prior to going home.    Discharge Diagnoses:  Principal Problem:   GI bleed Active Problems:   Thoracic aortic aneurysm (HCC)   HTN (hypertension)   Hyperlipidemia   OSA on CPAP   Chronic ITP (idiopathic thrombocytopenia) (HCC)   Lower GI bleeding probably secondary to diverticular bleed. Patient was recently admitted and discharged and had extensive work-up with an EGD/flex sigmoidoscopy, tagged RBC scan and CT angiogram of the abdomen without any source.  He was readmitted  again this time for  recurrent bleeding. He underwent 1 unit of PRBC transfusion.   GI consulted and underwent Colonoscopy which showed Blood in the entire examined colon.Diverticulosis in the sigmoid colon, in the descending colon and in the transverse colon. Non-bleeding internal hemorrhoids.  Started on clears and advanced to regular diet , had normal BM prior to discharge without any blood.    Acute blood loss anemia probably from the lower GI bleed Transfuse to keep hemoglobin greater than 7. 1 unit of PRBC transfusion given Repeat hemoglobin post transfusion is 10,to 9 to 8.7 to 8.8.     Stable coronary artery disease, Pt denies any chest pain or sob.    Thoracic and AAA Recommend outpatient follow up .     OSA on CPAP.    Chronic ITP:  Low platelets and clumping of platelets seen.  Hematology consulted and recommendations given.  Platelets are around 80,000.   Leukocytosis:  Resolved. probably reactive.     Hypertension;  Well controlled.     Mild AKI:  Resolved.   Hyperlipidemia:  Continue with Crestor.   Hypokalemia replaced.        Discharge Instructions  Discharge Instructions    Diet - low sodium heart healthy   Complete by: As directed    Discharge instructions   Complete by: As directed    Please follow up with gastroenterology as needed .   Increase activity slowly   Complete by: As directed      Allergies as of 06/03/2020   No Known Allergies     Medication List    STOP taking these medications   omega-3 acid ethyl  esters 1 g capsule Commonly known as: Lovaza     TAKE these medications   acetaminophen 325 MG tablet Commonly known as: TYLENOL Take 2 tablets (650 mg total) by mouth every 6 (six) hours as needed for mild pain or fever.   amLODipine 10 MG tablet Commonly known as: NORVASC Take 10 mg by mouth daily at 12 noon.   Ensure Plus Liqd Take 237 mLs by mouth daily.   ferrous sulfate  325 (65 FE) MG tablet Take 325 mg by mouth daily at 12 noon.   fluticasone 50 MCG/ACT nasal spray Commonly known as: FLONASE Place 1 spray into both nostrils daily as needed for allergies or rhinitis.   latanoprost 0.005 % ophthalmic solution Commonly known as: XALATAN Place 1 drop into both eyes 2 (two) times daily.   montelukast 10 MG tablet Commonly known as: SINGULAIR Take 10 mg by mouth at bedtime.   multivitamin with minerals Tabs tablet Take 1 tablet by mouth daily at 12 noon.   pantoprazole 40 MG tablet Commonly known as: PROTONIX Take 20 mg by mouth 2 (two) times daily.   polyethylene glycol 17 g packet Commonly known as: MIRALAX / GLYCOLAX Take 17 g by mouth daily.   psyllium 95 % Pack Commonly known as: HYDROCIL/METAMUCIL Take 1 packet by mouth daily.   rosuvastatin 10 MG tablet Commonly known as: CRESTOR Take 1 tablet by mouth once daily What changed: when to take this   triamcinolone cream 0.1 % Commonly known as: KENALOG Apply 1 application topically 2 (two) times daily as needed (facial itching/rash).       Follow-up Information    [name], MD. Schedule an appointment as soon as possible for a visit in 1 week(s).   Specialty: Internal Medicine Contact information: [place] [address] [phone]        Martinique, [name], MD .   Specialty: Cardiology Contact information: [address] [place] [address] [phone]              No Known Allergies  Consultations:  Gastroenterology.   Procedures/Studies: NM GI Blood Loss  Result Date: 05/31/2020 CLINICAL DATA:  Persistent GI bleeding EXAM: NUCLEAR MEDICINE GASTROINTESTINAL BLEEDING SCAN TECHNIQUE: Sequential abdominal images were obtained following intravenous administration of Tc-76m labeled red blood cells. RADIOPHARMACEUTICALS:  Twenty-five mCi Tc-6m pertechnetate in-vitro labeled red cells. COMPARISON:  05/25/2020, CT from earlier in  the same day. FINDINGS: Adequate uptake is noted throughout the liver and spleen. Vascular structures are well visualized. Bladder shows normal opacification. There remains some increased activity identified in the expected region of the rectum although no mobility of this activity is seen. The overall appearance is similar to that seen on the prior exam. IMPRESSION: Persistent uptake in the region of the distal rectum. The overall appearance is similar to that seen on the prior exam from 05/25/2020. Distal rectal bleeding could not be totally excluded. No other GI bleeding is seen. Electronically Signed   By: Inez Catalina M.D.   On: 05/31/2020 22:27   NM GI Blood Loss  Addendum Date: 05/25/2020   ADDENDUM REPORT: 05/25/2020 07:41 ADDENDUM: Study discussed by telephone with Dr. Valeta Harms on 05/25/2020 at 0733 hours. Electronically Signed   By: Genevie Ann M.D.   On: 05/25/2020 07:41   Result Date: 05/25/2020 CLINICAL DATA:  70 year old male with bright red blood per rectum. Recent sigmoidoscopy revealing blood clots and diverticular disease. EXAM: NUCLEAR MEDICINE GASTROINTESTINAL BLEEDING SCAN TECHNIQUE: Sequential abdominal images were obtained  following intravenous administration of Tc-47m labeled red blood cells. RADIOPHARMACEUTICALS:  Twenty-four mCi Tc-37m pertechnetate in-vitro labeled red cells. COMPARISON:  CT Abdomen and Pelvis 05/03/2020. FINDINGS: Expected blood pool activity. Tortuous abdominal aorta redemonstrated. No abnormal radiotracer accumulation in the upper, mid abdomen, or lower quadrants. However, located about 14 cm caudal to the aortoiliac bifurcation is intermittent moderate to intense accumulation of radiotracer activity. Judging from the coronal CT Abdomen and Pelvis, this is caudal to the expected level of the urinary bladder. This activity waxes and wanes. There is no transit of this activity to confirm a GI source. IMPRESSION: This examination is suspicious for but not confirmatory of  active GI bleeding from the distal rectum. No abnormal radiotracer activity elsewhere in the abdomen. Electronically Signed: By: Genevie Ann M.D. On: 05/25/2020 07:21   DG Chest Port 1 View  Result Date: 05/25/2020 CLINICAL DATA:  70 year old male intubated.  GI bleeding. EXAM: PORTABLE CHEST 1 VIEW COMPARISON:  Portable chest 05/24/2020 and earlier. FINDINGS: Portable AP semi upright view at 0247 hours. Endotracheal tube in good position just below the level the clavicles. Lower lung volumes. Right IJ central line position not significantly changed. Stable cardiac size and mediastinal contours. No pneumothorax. Increased bibasilar atelectasis. No pulmonary edema or pleural effusion identified. Paucity of bowel gas in the upper abdomen. No acute osseous abnormality identified. IMPRESSION: 1. Intubated, ETT tip in good position just below the clavicles. 2. Lower lung volumes with new bibasilar atelectasis. 3. Stable right IJ central line. Electronically Signed   By: Genevie Ann M.D.   On: 05/25/2020 05:15   DG CHEST PORT 1 VIEW  Result Date: 05/25/2020 CLINICAL DATA:  Central line placement. EXAM: PORTABLE CHEST 1 VIEW COMPARISON:  May 24, 2020 (8:45 p.m.) FINDINGS: A right internal jugular venous catheter is seen with its distal tip noted at the junction of the superior vena cava and right atrium. Multiple sternal wires are seen. The heart size and mediastinal contours are within normal limits. An artificial aortic valve is noted. Both lungs are clear. The visualized skeletal structures are unremarkable. IMPRESSION: Right internal jugular venous catheter placement and positioning, as described above. Electronically Signed   By: Virgina Norfolk M.D.   On: 05/25/2020 01:04   DG Chest Port 1 View  Result Date: 05/24/2020 CLINICAL DATA:  GI bleed, syncope. EXAM: PORTABLE CHEST 1 VIEW COMPARISON:  October 31, 2018 FINDINGS: Multiple sternal wires are noted. The heart size and mediastinal contours are within normal  limits. An artificial aortic valve is seen. Both lungs are clear. The visualized skeletal structures are unremarkable. IMPRESSION: No active disease. Electronically Signed   By: Virgina Norfolk M.D.   On: 05/24/2020 21:31   CT Angio Abd/Pel W and/or Wo Contrast  Result Date: 05/31/2020 CLINICAL DATA:  Rectal bleeding EXAM: CTA ABDOMEN AND PELVIS WITHOUT AND WITH CONTRAST TECHNIQUE: Multidetector CT imaging of the abdomen and pelvis was performed using the standard protocol during bolus administration of intravenous contrast. Multiplanar reconstructed images and MIPs were obtained and reviewed to evaluate the vascular anatomy. CONTRAST:  176mL OMNIPAQUE IOHEXOL 350 MG/ML SOLN COMPARISON:  Recent CT a abdomen/pelvis 05/25/2020 FINDINGS: VASCULAR Aorta: Stable fusiform aneurysmal dilation of the infrarenal abdominal aorta with a maximal diameter of 4.8 cm. No change compared to recent prior imaging. Mild calcified atherosclerotic plaque. Wall adherent mural thrombus within the aneurysmal segment. Celiac: Patent without evidence of aneurysm, dissection, vasculitis or significant stenosis. Lateral segmental branch of the left hepatic artery is replaced to the left  gastric artery. SMA: Patent without evidence of aneurysm, dissection, vasculitis or significant stenosis. Renals: Both main renal arteries are patent without evidence of aneurysm, dissection, vasculitis, fibromuscular dysplasia or significant stenosis. Small accessory artery to the left lower pole. IMA: Patent without evidence of aneurysm, dissection, vasculitis or significant stenosis. 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 Chronic non flow limiting dissection flap also present along the right common iliac artery secondary to a penetrating atherosclerotic ulcer. No aneurysmal dilation. Aneurysmal dilation of the anterior division of the left internal iliac artery up to 3.3  cm, unchanged. Aneurysmal dilation of the right internal iliac artery up to 3.0 cm, unchanged. The external iliac arteries are relatively spared from disease. Proximal Outflow: Bilateral common femoral and visualized portions of the superficial and profunda femoral arteries are patent without evidence of aneurysm, dissection, vasculitis or significant stenosis. Veins: No focal venous abnormality. Review of the MIP images confirms the above findings. NON-VASCULAR Lower chest: No acute abnormality. Hepatobiliary: Large gallstone without evidence of gallbladder wall thickening or inflammation. Normal hepatic contour morphology. Stable circumscribed low-attenuation lesions consistent with small cysts or biliary hamartomas. No solid lesion identified. No biliary ductal dilatation. Pancreas: Unremarkable. No pancreatic ductal dilatation or surrounding inflammatory changes. Spleen: Normal in size without focal abnormality. Adrenals/Urinary Tract: Normal adrenal glands. Stable bilateral renal cysts and parapelvic renal sinus cysts of varying complexity without interval change compared to recent prior imaging. Stomach/Bowel: Colonic diverticular disease without CT evidence of active inflammation. No evidence of obstruction or focal bowel wall thickening. Normal appendix in the right lower quadrant. The terminal ileum is unremarkable. Lymphatic: No suspicious lymphadenopathy. Reproductive: Prostate is unremarkable. Other: Fat containing umbilical hernia. No inguinal hernia. No ascites. Musculoskeletal: No acute fracture or aggressive appearing lytic or blastic osseous lesion. Multilevel degenerative disc disease. IMPRESSION: VASCULAR 1. No evidence of active gastrointestinal bleeding at this time. 2. Stable infrarenal abdominal aortic, left common iliac, left anterior division internal iliac and right internal iliac artery aneurysms without change compared to recent prior imaging. Recommend follow-up every 6 months and  vascular consultation. This recommendation follows ACR consensus guidelines: White Paper of the ACR Incidental Findings Committee II on Vascular Findings. J Am Coll Radiol 2013; 10:789-794. NON-VASCULAR 1. No acute abnormality within the abdomen or pelvis. 2. Colonic diverticular disease without CT evidence of active inflammation. 3. Additional ancillary findings as above without significant interval change. Aortic Atherosclerosis (ICD10-I70.0); Aortic aneurysm NOS (ICD10-I71.9). Electronically Signed   By: Jacqulynn Cadet M.D.   On: 05/31/2020 09:36   CT Angio Abd/Pel W and/or Wo Contrast  Result Date: 05/25/2020 CLINICAL DATA:  70 year old gentleman with significant GI bleed leading to cardiac arrest. History of diverticular disease and ulcerative colitis. EXAM: CTA ABDOMEN AND PELVIS WITHOUT AND WITH CONTRAST TECHNIQUE: Multidetector CT imaging of the abdomen and pelvis was performed using the standard protocol during bolus administration of intravenous contrast. Multiplanar reconstructed images and MIPs were obtained and reviewed to evaluate the vascular anatomy. CONTRAST:  191mL OMNIPAQUE IOHEXOL 350 MG/ML SOLN COMPARISON:  05/03/2020 01/30/2014 FINDINGS: VASCULAR Aorta: Infrarenal abdominal aortic aneurysm measures 4.8 x 4.2 cm which is not significantly changed since the prior study. The aneurysm measured 3.9 x 3.1 cm on prior CT from 01/30/2014. Celiac: Mild dilatation of the celiac artery measuring up to 9 mm is not significantly changed dating back to 01/30/2014. celiac artery is otherwise normal. SMA: Mild narrowing of the proximal superior mesenteric artery predominately due  to noncalcified plaque. Otherwise normal. Renals: No significant stenosis of the renal arteries. IMA: Inferior mesenteric artery is patent and originates at the level of the abdominal aortic aneurysm. Inflow: Aneurysmal dilatation of the right internal iliac artery measuring up to 2.9 cm has increased in size since 2016 where  it measured 1.7 cm. Right common and external iliac arteries are normal. The left common iliac artery is dilated measuring up to 1.8 cm which is only slightly larger in size since 2016 where it measured 1.6 cm. Non flow limiting dissection noted within the left common iliac artery. 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 The internal iliac artery appears occluded at the level of the aneurysm. There is opacification of distal branches consistent with retrograde collateral flow. Left external iliac artery is patent. Proximal Outflow: No significant abnormality of the visualized inflow vessels bilaterally. Veins: No significant abnormality of the hepatic, portal, or superior mesenteric veins. Review of the MIP images confirms the above findings. NON-VASCULAR Lower chest: Mild bibasilar atelectasis. Hepatobiliary: Subcentimeter hepatic hypodensity in the left lobe is too small to fully characterize. Liver otherwise normal. Cholelithiasis. Pancreas: Unremarkable. No pancreatic ductal dilatation or surrounding inflammatory changes. Spleen: Normal in size without focal abnormality. Adrenals/Urinary Tract: Adrenal glands are normal. Layering calculi again seen in the upper pole of the left kidney. Multiple bilateral renal cysts again seen. No hydronephrosis. Small diverticulum again seen at the anterior bladder dome. Bladder otherwise unremarkable. Stomach/Bowel: Stomach and small bowel unremarkable. There is mild pericolonic fat stranding along the sigmoid and descending colon suggestive of mild colitis. There is extensive diverticulosis of the descending and sigmoid colon. No active hemorrhage identified. Lymphatic: No enlarged lymph nodes Reproductive: Prostate is mildly enlarged. Other: Small fat containing umbilical hernia. Musculoskeletal: Severe asymmetric right hip osteoarthrosis with partial collapse of the femoral head suspicious for avascular necrosis.  Mild-to-moderate degenerative changes seen throughout the lumbar spine. IMPRESSION: VASCULAR 1. No active gastrointestinal hemorrhage. 2. Aneurysm of the infrarenal abdominal aorta measuring up to 4.8 x 4.2 cm has increased in size since 2016 where it measured 3.9 x 3.1 cm. 3. 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 Aneurysm on the right measures 2.9 cm currently compared to 1.7 cm in 2016. 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 4. 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 NON-VASCULAR Extensive diverticulosis of the descending and sigmoid colon. Mild stranding of the pericolonic fat adjacent to the segments suggestive of low-grade inflammation. Electronically Signed   By: Miachel Roux M.D.   On: 05/25/2020 10:29     Subjective No new complaints.   Discharge Exam: Vitals:   06/03/20 1023 06/03/20 1415  BP: 118/72 (!) 124/91  Pulse: 89 94  Resp: 18 18  Temp: 98.3 F (36.8 C) 99.1 F (37.3 C)  SpO2: 100% 100%   Vitals:   06/02/20 1959 06/03/20 0350 06/03/20 1023 06/03/20 1415  BP: 121/86 128/88 118/72 (!) 124/91  Pulse: 92 93 89 94  Resp: 18 18 18 18   Temp: 99.5 F (37.5 C) 99.2 F (37.3 C) 98.3 F (36.8 C) 99.1 F (37.3 C)  TempSrc: Oral Oral Oral Oral  SpO2: 97% 98% 100% 100%  Weight:      Height:        General: Pt is alert, awake, not in acute distress Cardiovascular: RRR, S1/S2 +, no rubs, no  gallops Respiratory: CTA bilaterally, no wheezing, no rhonchi Abdominal: Soft, NT, ND, bowel sounds + Extremities: no edema, no cyanosis    The results of significant diagnostics from this hospitalization (including imaging, microbiology, ancillary and laboratory) are listed below for reference.     Microbiology: No results found for this or any previous visit (from the past 240 hour(s)).   Labs: BNP (last 3 results) No results for input(s): BNP in the last 8760 hours. Basic  Metabolic Panel: No results for input(s): NA, K, CL, CO2, GLUCOSE, BUN, CREATININE, CALCIUM, MG, PHOS in the last 168 hours. Liver Function Tests: No results for input(s): AST, ALT, ALKPHOS, BILITOT, PROT, ALBUMIN in the last 168 hours. No results for input(s): LIPASE, AMYLASE in the last 168 hours. No results for input(s): AMMONIA in the last 168 hours. CBC: No results for input(s): WBC, NEUTROABS, HGB, HCT, MCV, PLT in the last 168 hours. Cardiac Enzymes: No results for input(s): CKTOTAL, CKMB, CKMBINDEX, TROPONINI in the last 168 hours. BNP: Invalid input(s): POCBNP CBG: No results for input(s): GLUCAP in the last 168 hours. D-Dimer No results for input(s): DDIMER in the last 72 hours. Hgb A1c No results for input(s): HGBA1C in the last 72 hours. Lipid Profile No results for input(s): CHOL, HDL, LDLCALC, TRIG, CHOLHDL, LDLDIRECT in the last 72 hours. Thyroid function studies No results for input(s): TSH, T4TOTAL, T3FREE, THYROIDAB in the last 72 hours.  Invalid input(s): FREET3 Anemia work up No results for input(s): VITAMINB12, FOLATE, FERRITIN, TIBC, IRON, RETICCTPCT in the last 72 hours. Urinalysis    Component Value Date/Time   COLORURINE STRAW (A) 06/01/2020 2150   APPEARANCEUR CLEAR 06/01/2020 2150   LABSPEC 1.005 06/01/2020 2150   PHURINE 7.0 06/01/2020 2150   GLUCOSEU NEGATIVE 06/01/2020 2150   HGBUR SMALL (A) 06/01/2020 2150   BILIRUBINUR NEGATIVE 06/01/2020 2150   KETONESUR NEGATIVE 06/01/2020 2150   PROTEINUR NEGATIVE 06/01/2020 2150   UROBILINOGEN 0.2 04/27/2010 2330   NITRITE NEGATIVE 06/01/2020 2150   LEUKOCYTESUR NEGATIVE 06/01/2020 2150   Sepsis Labs Invalid input(s): PROCALCITONIN,  WBC,  LACTICIDVEN Microbiology No results found for this or any previous visit (from the past 240 hour(s)).   Time coordinating discharge: 31 minutes.  SIGNED:   [name], MD  [facility] 06/12/2020, 8:38 AM

## 2020-06-17 DIAGNOSIS — Z8719 Personal history of other diseases of the digestive system: Secondary | ICD-10-CM | POA: Diagnosis not present

## 2020-06-17 DIAGNOSIS — D696 Thrombocytopenia, unspecified: Secondary | ICD-10-CM | POA: Diagnosis not present

## 2020-06-17 DIAGNOSIS — D500 Iron deficiency anemia secondary to blood loss (chronic): Secondary | ICD-10-CM | POA: Diagnosis not present

## 2020-06-19 NOTE — Progress Notes (Signed)
Cardiology Office Note   Date:  06/21/2020   ID:  Jonahtan, Manseau 06-May-1950, MRN [id number]  PCP:  [name], MD  Cardiologist:  Dr. Martinique  CC: [facility] Follow Up   History of Present Illness: JACOB CICERO is a 70 y.o. male with complicated PMH, who presents for hospital follow-up after admission for hemorrhagic shock thought to be due to diverticular bleeding.  He received blood transfusions.  GI consult was obtained with EGD and colonoscopy.  This revealed blood in the entire colon, diverticulosis in the sigmoid colon and in the descending colon and in the transverse colon.    Cardiology follows him for AAA, carotid artery disease, coronary artery disease and severe AI with aortic root dilatation, s/p repair  Most recent cardiac catheterization prior to AAA repair and aortic valve repair was on 04/16/2016 revealing single-vessel obstructive CAD involving a small nondominant RCA.  He had mild LV dysfunction with an EF of 45%.  It was noted at that time he had moderate to severe AI 3+ with normal right heart pressures and LV pressures.   He is followed by Dr. Oneida Alar, who had a CT completed which revealed a  5 cm infrarenal aortic aneurysm, bilateral internal iliac artery aneurysms.  He underwent ascending aortic root replacement using a 32 mm Gelweave Valsalva graft and AVR using 29 mm Edwards [employer] Ease aortic bioprosthetic valve on 09/15/2016 by Dr. Servando Snare.  Infrarenal aneurysms were to be treated medically.  Other history includes thrombocytopenia, OSA on CPAP, hypertension, and hyperlipidemia.  He was taken off of aspirin.   He is without any cardiac complaints today. He has some right sides rib pain that radiates in to the axilla than is sore with inspiration. This has gotten better over time.  He states he does not remember anything once he arrived at the ED registration until he awoke on a gurney. He reports that he "went the other side" seeing colors and hearing  sounds that he had never seen before,   He has followed up with GI, [facility]VS, hematology, and nephrology. He is still considering whether to proceed with internal iliac aneurysm repair. This is to be revisited in 6 months.    Past Medical History:  Diagnosis Date  . AAA (abdominal aortic aneurysm) ([place])   . Anemia   . Arthritis   . Asymptomatic bilateral carotid artery stenosis 08/2015   1-39%   . Cataracts, bilateral   . 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 Coronary artery disease   . Diverticulosis   . Enlarged aorta ([place])   . Enlarged prostate    slightly  . GERD (gastroesophageal reflux disease)    takes Pantoprazole daily as needed  . Glaucoma    uses eye drops daily  . Headache   . History of colon polyps    benign  . History of kidney stones   . Hyperlipidemia    no on any meds  . Hypertension    takes Amlodipine and Atenolol daily  . OSA on CPAP   . Vocal cord nodule    pt. states  it's a" growth on vocal cord"    Past Surgical History:  Procedure Laterality Date  . AORTIC ARCH ANGIOGRAPHY N/A 04/16/2016   Procedure: Aortic Arch Angiography;  Surgeon: [name] Martinique, MD;  Location: [place] [facility];  Service: Cardiovascular;  Laterality: N/A;  . AORTIC VALVE REPLACEMENT N/A 09/15/2016   Procedure: AORTIC VALVE REPLACEMENT (AVR);  Surgeon: Lanelle Bal  B, MD;  Location: [place];  Service: Open Heart Surgery;  Laterality: N/A;  Using 42mm Edwards [employer] Magna Ease Aortic Bioprosthesis Valve  . ASCENDING AORTIC ROOT REPLACEMENT N/A 09/15/2016   Procedure: ASCENDING AORTIC ROOT REPLACEMENT;  Surgeon: Grace Isaac, MD;  Location: [place];  Service: Open Heart Surgery;  Laterality: N/A;  Using 28mm Gelweave Valsalva Graft  . COLONOSCOPY    . COLONOSCOPY Left 04/16/2019   Procedure: COLONOSCOPY;  Surgeon: Arta Silence, MD;  Location: [facility] ENDOSCOPY;  Service: Endoscopy;  Laterality: Left;  . COLONOSCOPY N/A 06/01/2020   Procedure: COLONOSCOPY;   Surgeon: Ronnette Juniper, MD;  Location: [place];  Service: Gastroenterology;  Laterality: N/A;  . COLONOSCOPY WITH ESOPHAGOGASTRODUODENOSCOPY (EGD)    . ESOPHAGOGASTRODUODENOSCOPY N/A 05/25/2020   Procedure: ESOPHAGOGASTRODUODENOSCOPY (EGD);  Surgeon: Wilford Corner, MD;  Location: [place];  Service: Endoscopy;  Laterality: N/A;  . ESOPHAGOGASTRODUODENOSCOPY (EGD) WITH PROPOFOL Left 04/16/2019   Procedure: ESOPHAGOGASTRODUODENOSCOPY (EGD) WITH PROPOFOL;  Surgeon: Arta Silence, MD;  Location: [facility] ENDOSCOPY;  Service: Endoscopy;  Laterality: Left;  . FLEXIBLE SIGMOIDOSCOPY N/A 05/25/2020   Procedure: FLEXIBLE SIGMOIDOSCOPY;  Surgeon: Wilford Corner, MD;  Location: [place];  Service: Endoscopy;  Laterality: N/A;  . IR THORACENTESIS ASP PLEURAL SPACE W/IMG GUIDE  10/23/2016  . MICROLARYNGOSCOPY Right 09/20/2017   Procedure: MICROLARYNGOSCOPY WITH  EXCISION OF VOCAL CORD LESION;  Surgeon: Leta Baptist, MD;  Location: [place];  Service: ENT;  Laterality: Right;  . MULTIPLE EXTRACTIONS WITH ALVEOLOPLASTY N/A 06/10/2016   Procedure: Extraction of tooth #'s 2,8,13,15, and 29  with alveoloplasty, maxillary right and left buccal exostoses reductions, and gross debridement of remaining teeth.;  Surgeon: Lenn Cal, DDS;  Location: [place];  Service: Oral Surgery;  Laterality: N/A;  . RIGHT/LEFT HEART CATH AND CORONARY ANGIOGRAPHY N/A 04/16/2016   Procedure: Right/Left Heart Cath and Coronary Angiography;  Surgeon: [name] Martinique, MD;  Location: [place] [facility];  Service: Cardiovascular;  Laterality: N/A;  . TEE WITHOUT CARDIOVERSION N/A 09/15/2016   Procedure: TRANSESOPHAGEAL ECHOCARDIOGRAM (TEE);  Surgeon: Grace Isaac, MD;  Location: [place];  Service: Open Heart Surgery;  Laterality: N/A;     Current Outpatient Medications  Medication Sig Dispense Refill  . acetaminophen (TYLENOL) 325 MG tablet Take 2 tablets (650 mg total) by mouth every 6 (six) hours as needed  for mild pain or fever.    Marland Kitchen amLODipine (NORVASC) 10 MG tablet Take 10 mg by mouth daily at 12 noon.    . Ensure Plus (ENSURE PLUS) LIQD Take 237 mLs by mouth daily.    . ferrous sulfate 325 (65 FE) MG tablet Take 325 mg by mouth daily at 12 noon.    . fluticasone (FLONASE) 50 MCG/ACT nasal spray Place 1 spray into both nostrils daily as needed for allergies or rhinitis.    Marland Kitchen latanoprost (XALATAN) 0.005 % ophthalmic solution Place 1 drop into both eyes 2 (two) times daily.    . montelukast (SINGULAIR) 10 MG tablet Take 10 mg by mouth at bedtime.    . Multiple Vitamin (MULTIVITAMIN WITH MINERALS) TABS tablet Take 1 tablet by mouth daily at 12 noon.    . pantoprazole (PROTONIX) 40 MG tablet Take 20 mg by mouth 2 (two) times daily.    . polyethylene glycol (MIRALAX / GLYCOLAX) 17 g packet Take 17 g by mouth daily. 14 each 0  . psyllium (HYDROCIL/METAMUCIL) 95 % PACK Take 1 packet by mouth daily. 240 each 3  . rosuvastatin (CRESTOR) 10  MG tablet Take 1 tablet by mouth once daily (Patient taking differently: Take 10 mg by mouth daily at 12 noon.) 90 tablet 1  . triamcinolone cream (KENALOG) 0.1 % Apply 1 application topically 2 (two) times daily as needed (facial itching/rash).     No current facility-administered medications for this visit.    Allergies:   Patient has no known allergies.    Social History:  The patient  reports that he has quit smoking. His smoking use included cigarettes. He has a 25.00 pack-year smoking history. He has never used smokeless tobacco. He reports that he does not drink alcohol and does not use drugs.   Family History:  The patient's family history includes Heart attack in his father; Heart disease in his father; Thyroid disease in his sister.    ROS: All other systems are reviewed and negative. Unless otherwise mentioned in H&P    PHYSICAL EXAM: VS:  BP 120/82   Pulse 65   Ht 5\' 9"  (1.753 m)   Wt 208 lb (94.3 kg)   SpO2 99%   BMI 30.72 kg/m  , BMI Body  mass index is 30.72 kg/m. GEN: Well nourished, well developed, in no acute distress HEENT: normal Neck: no JVD, carotid bruits, or masses Cardiac: RRR; 1/6 systolic  Murmurs heard in the LSB, and crisp click heard in the RSB., rubs, or gallops,no edema  Respiratory:  Clear to auscultation bilaterally, normal work of breathing GI: soft, nontender, nondistended, + BS MS: no deformity or atrophy Skin: warm and dry, no rash Neuro:  Strength and sensation are intact Psych: euthymic mood, full affect   EKG: Not completed this office visit.  Recent Labs: 05/26/2020: Magnesium 2.0 05/31/2020: ALT 35 06/01/2020: BUN 24; Creatinine, Ser 1.22; Potassium 3.5; Sodium 135 06/03/2020: Hemoglobin 8.8; Platelets 80    Lipid Panel    Component Value Date/Time   CHOL 157 05/11/2019 0946   TRIG 201 (H) 05/26/2020 0439   HDL 32 (L) 05/11/2019 0946   CHOLHDL 4.9 05/11/2019 0946   CHOLHDL 3.5 04/28/2010 0703   VLDL 14 04/28/2010 0703   LDLCALC 81 05/11/2019 0946      Wt Readings from Last 3 Encounters:  06/21/20 208 lb (94.3 kg)  05/31/20 224 lb 13.9 oz (102 kg)  05/27/20 225 lb 15.5 oz (102.5 kg)      Other studies Reviewed: Echocardiogram May 28, 2019 1. Left ventricular ejection fraction, by estimation, is 55 to 60%. The  left ventricle has normal function. The left ventricle has no regional  wall motion abnormalities. There is mild left ventricular hypertrophy of  the basal-septal segment. Left  ventricular diastolic parameters were normal.  2. Right ventricular systolic function is normal. The right ventricular  size is normal. There is normal pulmonary artery systolic pressure. The  estimated right ventricular systolic pressure is 08.1 mmHg.  3. Left atrial size was mildly dilated.  4. The mitral valve is normal in structure. Mild to moderate mitral valve  regurgitation. No evidence of mitral stenosis.  5. The aortic valve has been repaired/replaced. Aortic valve  regurgitation is  not visualized. No aortic stenosis is present. 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 Procedure  Date: 2018. Echo findings are consistent  with normal structure and function of the aortic valve prosthesis. Aortic  valve mean gradient measures 10.8 mmHg. Aortic valve Vmax measures 2.12  m/s.  6. Aortic root/ascending aorta has been repaired/replaced.  7. The inferior vena cava is normal in  size with greater than 50%  respiratory variability, suggesting right atrial pressure of 3 mmHg.   Comparison(s): Prior images unable to be directly viewed, comparison made  by report only.   Left and Right Hear Cath 04/16/2016   Mid RCA lesion, 80 %stenosed.  Ost LAD to Prox LAD lesion, 25 %stenosed.  Dist LAD lesion, 50 %stenosed.  There is mild left ventricular systolic dysfunction.  LV end diastolic pressure is normal.  The left ventricular ejection fraction is 45-50% by visual estimate.  There is no mitral valve regurgitation.  There is no aortic valve stenosis.  LV end diastolic pressure is normal.   1. 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 Mild LV dysfunction. EF 45%. 3. Ascending thoracic aortic aneurysm. 4. 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 5. Normal Right heart  And LV filling pressures 6. Normal cardiac output.   Plan: Aortic root grafting and AVR.   CT Abdomen and Pelvis 05/31/2020 IMPRESSION: VASCULAR  1. No evidence of active gastrointestinal bleeding at this time. 2. Stable infrarenal abdominal aortic, left common iliac, left anterior division internal iliac and right internal iliac artery aneurysms without change compared to recent prior imaging. Recommend follow-up every 6 months and vascular consultation. This recommendation follows ACR consensus guidelines: White Paper of the ACR Incidental Findings Committee II on Vascular Findings. J Am Coll Radiol 2013; 10:789-794.  NON-VASCULAR  1. No acute  abnormality within the abdomen or pelvis. 2. Colonic diverticular disease without CT evidence of active inflammation. 3. Additional ancillary findings as above without significant interval change.  ASSESSMENT AND PLAN:  1.  CAD: Stable from cardiac standpoint despite recent emergent admission for significant GIB. He offers no cardiac complaints. He is medically compliant and is no longer taking ASA. Continue secondary management.  2. Diverticulitis with GIB: Recent admission for same. He is recovering well and is being followed by GI. He is on iron replacement with due to chronic anemia, with labs to follow.   3. AAA: S/P repair by Dr. Servando Snare in 2018.  4. Bilateral Iliac artery aneurysms: Followed by VVS with plans to revisit repair in 6 months. He is not experiencing any pain currently.   5. Hypertension: Excellent control of BP today. No changes in current regimen. Stable.    Current medicines are reviewed at length with the patient today.  I have spent 40 minutes dedicated to the care of this patient on the date of this encounter to include pre-visit review of records, assessment, management and diagnostic testing,with shared decision making.  Labs/ tests ordered today include: None   Phill Myron. West Pugh, ANP, AACC   06/21/2020 12:11 PM    [address] Office [phone] Fax [phone]  Notice: This dictation was prepared with Dragon dictation along with smaller phrase technology. Any transcriptional errors that result from this process are unintentional and may not be corrected upon review.

## 2020-06-20 ENCOUNTER — Other Ambulatory Visit: Payer: Self-pay | Admitting: *Deleted

## 2020-06-20 DIAGNOSIS — R351 Nocturia: Secondary | ICD-10-CM | POA: Diagnosis not present

## 2020-06-20 DIAGNOSIS — R3121 Asymptomatic microscopic hematuria: Secondary | ICD-10-CM | POA: Diagnosis not present

## 2020-06-20 DIAGNOSIS — N401 Enlarged prostate with lower urinary tract symptoms: Secondary | ICD-10-CM | POA: Diagnosis not present

## 2020-06-20 NOTE — Patient Outreach (Signed)
[facility]) Care Management  06/20/2020  Lonnie Hansen [birth year] [id number]   Outgoing call placed to member, state he has improved, has some chest soreness from compressions during hospitalization. Otherwise doing well.  Denies any urgent concerns, encouraged to contact this care manager with questions.  Agrees to follow up within the next month.  Goals Addressed            This Visit's Progress   . [facility] - Make and Keep All Appointments   On track    Timeframe:  Short-Term Goal Priority:  High Start Date:           5/5                  Expected End Date:    6/5                   Barriers: Knowledge    - ask family or friend for a ride - call to cancel if needed    Why is this important?    Part of staying healthy is seeing the doctor for follow-up care.   If you forget your appointments, there are some things you can do to stay on track.    Notes:   5/5 - Follow up with PCP on 5/6, GI on 5/23, and Cardioloy on 5/27  5/11 - Will see PCP again on 5/13 and oncology on 6/6  5/26 - Confirms follow up with PCP with hematology for labs.  Will see urologist today, cardiologist tomorrow, and hematologist on 6/6    . [facility] - [place] My Quality of Life   On track    Timeframe:  Long-Range Goal Priority:  Medium Start Date:     5/5                        Expected End Date:   7/5                    Barriers: Knowledge    - complete a living will - discuss my treatment options with the doctor or nurse    Why is this important?    Having a long-term illness can be scary.   It can also be stressful for you and your caregiver.   These steps may help.    Notes:   5/5 - Educated on signs of bleeding.  Encouraged to monitor blood pressure daily and record readings.  Blood pressure monitor sent.  5/11 - Discussed recent discharge instructions.  Call placed to PCP office to follow up on member's question about medications  5/26 - Denies any further  bleeding, report blood levels are starting to increase      Lonnie Hansen, Therapist, sports, MSN [place] Manager [phone]

## 2020-06-21 ENCOUNTER — Other Ambulatory Visit: Payer: Self-pay

## 2020-06-21 ENCOUNTER — Ambulatory Visit (INDEPENDENT_AMBULATORY_CARE_PROVIDER_SITE_OTHER): Payer: Medicare Other | Admitting: Adult Health

## 2020-06-21 ENCOUNTER — Encounter: Payer: Self-pay | Admitting: Adult Health

## 2020-06-21 VITALS — BP 120/82 | HR 65 | Ht 69.0 in | Wt 208.0 lb

## 2020-06-21 DIAGNOSIS — I2510 Atherosclerotic heart disease of native coronary artery without angina pectoris: Secondary | ICD-10-CM

## 2020-06-21 DIAGNOSIS — D693 Immune thrombocytopenic purpura: Secondary | ICD-10-CM

## 2020-06-21 DIAGNOSIS — I723 Aneurysm of iliac artery: Secondary | ICD-10-CM

## 2020-06-21 DIAGNOSIS — I10 Essential (primary) hypertension: Secondary | ICD-10-CM

## 2020-06-21 DIAGNOSIS — Z9889 Other specified postprocedural states: Secondary | ICD-10-CM | POA: Diagnosis not present

## 2020-06-21 DIAGNOSIS — K922 Gastrointestinal hemorrhage, unspecified: Secondary | ICD-10-CM | POA: Diagnosis not present

## 2020-06-21 DIAGNOSIS — Z952 Presence of prosthetic heart valve: Secondary | ICD-10-CM

## 2020-06-21 NOTE — Patient Instructions (Signed)
Medication Instructions:  No Changes  *If you need a refill on your cardiac medications before your next appointment, please call your pharmacy*   Lab Work: No Labs If you have labs (blood work) drawn today and your tests are completely normal, you will receive your results only by: Marland Kitchen MyChart Message (if you have MyChart) OR . A paper copy in the mail If you have any lab test that is abnormal or we need to change your treatment, we will call you to review the results.   Testing/Procedures: No Testing   Follow-Up: At [facility], you and your health needs are our priority.  As part of our continuing mission to provide you with exceptional heart care, we have created designated Provider Care Teams.  These Care Teams include your primary Cardiologist (physician) and Advanced Practice Providers (APPs -  Physician Assistants and Nurse Practitioners) who all work together to provide you with the care you need, when you need it.  Your next appointment:   3 month(s)  The format for your next appointment:   In Person  Provider:   Peter Martinique, MD

## 2020-06-28 ENCOUNTER — Other Ambulatory Visit: Payer: Self-pay | Admitting: *Deleted

## 2020-06-28 DIAGNOSIS — I712 Thoracic aortic aneurysm, without rupture, unspecified: Secondary | ICD-10-CM

## 2020-06-28 DIAGNOSIS — D693 Immune thrombocytopenic purpura: Secondary | ICD-10-CM

## 2020-07-01 ENCOUNTER — Encounter: Payer: Self-pay | Admitting: Hematology & Oncology

## 2020-07-01 ENCOUNTER — Other Ambulatory Visit: Payer: Self-pay

## 2020-07-01 ENCOUNTER — Inpatient Hospital Stay (HOSPITAL_BASED_OUTPATIENT_CLINIC_OR_DEPARTMENT_OTHER): Payer: Medicare Other | Admitting: Hematology & Oncology

## 2020-07-01 ENCOUNTER — Inpatient Hospital Stay: Payer: Medicare Other | Attending: Hematology & Oncology

## 2020-07-01 ENCOUNTER — Other Ambulatory Visit: Payer: Self-pay | Admitting: *Deleted

## 2020-07-01 VITALS — BP 161/93 | HR 82 | Temp 98.5°F | Wt 207.0 lb

## 2020-07-01 DIAGNOSIS — M2550 Pain in unspecified joint: Secondary | ICD-10-CM | POA: Insufficient documentation

## 2020-07-01 DIAGNOSIS — M791 Myalgia, unspecified site: Secondary | ICD-10-CM | POA: Diagnosis not present

## 2020-07-01 DIAGNOSIS — D693 Immune thrombocytopenic purpura: Secondary | ICD-10-CM | POA: Diagnosis not present

## 2020-07-01 DIAGNOSIS — Z79899 Other long term (current) drug therapy: Secondary | ICD-10-CM | POA: Insufficient documentation

## 2020-07-01 DIAGNOSIS — I712 Thoracic aortic aneurysm, without rupture, unspecified: Secondary | ICD-10-CM

## 2020-07-01 DIAGNOSIS — I2510 Atherosclerotic heart disease of native coronary artery without angina pectoris: Secondary | ICD-10-CM

## 2020-07-01 DIAGNOSIS — K921 Melena: Secondary | ICD-10-CM | POA: Diagnosis not present

## 2020-07-01 DIAGNOSIS — I700 Atherosclerosis of aorta: Secondary | ICD-10-CM | POA: Insufficient documentation

## 2020-07-01 LAB — CBC WITH DIFFERENTIAL (CANCER CENTER ONLY)
Abs Immature Granulocytes: 0.01 10*3/uL (ref 0.00–0.07)
Basophils Absolute: 0 10*3/uL (ref 0.0–0.1)
Basophils Relative: 0 %
Eosinophils Absolute: 0.1 10*3/uL (ref 0.0–0.5)
Eosinophils Relative: 2 %
HCT: 34.1 % — ABNORMAL LOW (ref 39.0–52.0)
Hemoglobin: 11.1 g/dL — ABNORMAL LOW (ref 13.0–17.0)
Immature Granulocytes: 0 %
Lymphocytes Relative: 30 %
Lymphs Abs: 1.5 10*3/uL (ref 0.7–4.0)
MCH: 30.2 pg (ref 26.0–34.0)
MCHC: 32.6 g/dL (ref 30.0–36.0)
MCV: 92.7 fL (ref 80.0–100.0)
Monocytes Absolute: 0.7 10*3/uL (ref 0.1–1.0)
Monocytes Relative: 13 %
Neutro Abs: 2.7 10*3/uL (ref 1.7–7.7)
Neutrophils Relative %: 55 %
Platelet Count: 78 10*3/uL — ABNORMAL LOW (ref 150–400)
RBC: 3.68 MIL/uL — ABNORMAL LOW (ref 4.22–5.81)
RDW: 15.7 % — ABNORMAL HIGH (ref 11.5–15.5)
WBC Count: 5 10*3/uL (ref 4.0–10.5)
nRBC: 0 % (ref 0.0–0.2)

## 2020-07-01 LAB — CMP (CANCER CENTER ONLY)
ALT: 7 U/L (ref 0–44)
AST: 16 U/L (ref 15–41)
Albumin: 4.5 g/dL (ref 3.5–5.0)
Alkaline Phosphatase: 42 U/L (ref 38–126)
Anion gap: 9 (ref 5–15)
BUN: 15 mg/dL (ref 8–23)
CO2: 28 mmol/L (ref 22–32)
Calcium: 10 mg/dL (ref 8.9–10.3)
Chloride: 105 mmol/L (ref 98–111)
Creatinine: 1.15 mg/dL (ref 0.61–1.24)
GFR, Estimated: >60 mL/min (ref >60)
Glucose, Bld: 98 mg/dL (ref 70–99)
Potassium: 3.3 mmol/L — ABNORMAL LOW (ref 3.5–5.1)
Sodium: 142 mmol/L (ref 135–145)
Total Bilirubin: 0.9 mg/dL (ref 0.3–1.2)
Total Protein: 7 g/dL (ref 6.5–8.1)

## 2020-07-01 LAB — PLATELET BY CITRATE

## 2020-07-01 LAB — SAVE SMEAR(SSMR), FOR PROVIDER SLIDE REVIEW

## 2020-07-01 NOTE — Progress Notes (Signed)
Hematology and Oncology Follow Up Visit  Lonnie Hansen [id number] [birth year] 70 y.o. 07/01/2020   Principle Diagnosis:   Chronic ITP  Current Therapy:    Weekly Nplate to keep platelet count over 100,000     Interim History:  Lonnie Hansen is back for a long awaited follow-up.  We have not seen him in the office for probably 2 years.  I saw him back in early May when he came in with diverticulosis and bleeding.  His platelet count was 74,000.  He has multiple issues.  He needs to have right hip surgery.  He has an aortic aneurysm that might need to be fixed.  He has a diverticular bleed that might need to have some type of surgical intervention if this keeps happening.  He has had no problems with bleeding since we saw him.  He is not taking aspirin or nonsteroidals.  He was taking nonsteroidals because of his arthritis and hip pain.  He has had no melena or bright red blood per rectum.  I think that we can get him back on Nplate.  I am pretty sure that this will help.  I really do not think that his platelet count should really be all that much of a problem with bleeding right now.  Typically with ITP, platelet count over 50,000 is adequate for hemostasis.  He is watching what he eats.  He has had no problems with cough or shortness of breath.  Currently, his performance status is ECOG 1.  Medications:  Current Outpatient Medications:  .  acetaminophen (TYLENOL) 325 MG tablet, Take 2 tablets (650 mg total) by mouth every 6 (six) hours as needed for mild pain or fever., Disp: , Rfl:  .  amLODipine (NORVASC) 10 MG tablet, Take 10 mg by mouth daily at 12 noon., Disp: , Rfl:  .  atenolol (TENORMIN) 25 MG tablet, Take 25 mg by mouth daily., Disp: , Rfl:  .  Ensure Plus (ENSURE PLUS) LIQD, Take 237 mLs by mouth daily., Disp: , Rfl:  .  ferrous sulfate 325 (65 FE) MG tablet, Take 325 mg by mouth daily at 12 noon., Disp: , Rfl:  .  latanoprost (XALATAN) 0.005 % ophthalmic solution, Place 1  drop into both eyes 2 (two) times daily., Disp: , Rfl:  .  Multiple Vitamin (MULTIVITAMIN WITH MINERALS) TABS tablet, Take 1 tablet by mouth daily at 12 noon., Disp: , Rfl:  .  pantoprazole (PROTONIX) 40 MG tablet, Take 20 mg by mouth 2 (two) times daily., Disp: , Rfl:  .  polyethylene glycol (MIRALAX / GLYCOLAX) 17 g packet, Take 17 g by mouth daily., Disp: 14 each, Rfl: 0 .  psyllium (HYDROCIL/METAMUCIL) 95 % PACK, Take 1 packet by mouth daily., Disp: 240 each, Rfl: 3 .  rosuvastatin (CRESTOR) 10 MG tablet, Take 1 tablet by mouth once daily (Patient taking differently: Take 10 mg by mouth daily at 12 noon.), Disp: 90 tablet, Rfl: 1 .  triamcinolone cream (KENALOG) 0.1 %, Apply 1 application topically 2 (two) times daily as needed (facial itching/rash)., Disp: , Rfl:  .  fluticasone (FLONASE) 50 MCG/ACT nasal spray, Place 1 spray into both nostrils daily as needed for allergies or rhinitis., Disp: , Rfl:  .  montelukast (SINGULAIR) 10 MG tablet, Take 10 mg by mouth at bedtime., Disp: , Rfl:   Allergies: No Known Allergies  Past Medical History, Surgical history, Social history, and Family History were reviewed and updated.  Review of Systems: Review of Systems  Constitutional: Negative.   HENT:  Negative.   Eyes: Negative.   Respiratory: Negative.   Cardiovascular: Negative.   Gastrointestinal: Positive for blood in stool.  Endocrine: Negative.   Genitourinary: Negative.    Musculoskeletal: Positive for arthralgias and myalgias.  Hematological: Negative.   Psychiatric/Behavioral: Negative.     Physical Exam:  weight is 93.9 kg. His oral temperature is 98.5 F (36.9 C). His blood pressure is 161/93 (abnormal) and his pulse is 82. His oxygen saturation is 100%.   Wt Readings from Last 3 Encounters:  07/01/20 93.9 kg  06/21/20 94.3 kg  05/31/20 102 kg    Physical Exam Vitals reviewed.  HENT:     Head: Normocephalic and atraumatic.  Eyes:     Pupils: Pupils are equal, round,  and reactive to light.  Cardiovascular:     Rate and Rhythm: Normal rate and regular rhythm.     Heart sounds: Normal heart sounds.  Pulmonary:     Effort: Pulmonary effort is normal.     Breath sounds: Normal breath sounds.  Abdominal:     General: Bowel sounds are normal.     Palpations: Abdomen is soft.  Musculoskeletal:        General: No tenderness or deformity. Normal range of motion.     Cervical back: Normal range of motion.  Lymphadenopathy:     Cervical: No cervical adenopathy.  Skin:    General: Skin is warm and dry.     Findings: No erythema or rash.  Neurological:     Mental Status: He is alert and oriented to person, place, and time.  Psychiatric:        Behavior: Behavior normal.        Thought Content: Thought content normal.        Judgment: Judgment normal.      Lab Results  Component Value Date   WBC 5.0 07/01/2020   HGB 11.1 (L) 07/01/2020   HCT 34.1 (L) 07/01/2020   MCV 92.7 07/01/2020   PLT 78 (L) 07/01/2020     Chemistry      Component Value Date/Time   NA 142 07/01/2020 0809   NA 140 05/11/2019 0946   K 3.3 (L) 07/01/2020 0809   CL 105 07/01/2020 0809   CO2 28 07/01/2020 0809   BUN 15 07/01/2020 0809   BUN 17 05/11/2019 0946   CREATININE 1.15 07/01/2020 0809   CREATININE 0.75 04/10/2016 1132      Component Value Date/Time   CALCIUM 10.0 07/01/2020 0809   ALKPHOS 42 07/01/2020 0809   AST 16 07/01/2020 0809   ALT 7 07/01/2020 0809   BILITOT 0.9 07/01/2020 0809     Impression and Plan: Lonnie Hansen is a very nice 70 year old African-American male.  He has chronic ITP.  His plate count really is not all that bad.  However, I doubt that surgeons will operate on him unless his platelet count is above 100,000.  As such, we will work on getting his platelet count above 100,000.  We will try him on Nplate.  I think this would be very reasonable to try.  We will start next week on the Nplate.  Once again his platelet count above 100,000, I  will let his orthopedic surgeon know so that he can operate on his hip.  However, the orthopedic surgeon is probably not going to operate because of the abdominal aortic aneurysm.  Again, we will do our part to get his platelet count above 100,000.  I will  plan to see Mr. Raneri back in another 4 to 5 weeks.    Volanda Napoleon, MD 6/6/20229:21 AM

## 2020-07-02 ENCOUNTER — Telehealth: Payer: Self-pay

## 2020-07-02 NOTE — Telephone Encounter (Signed)
appts made per 6.6.22 los and pt is aware, pt will also gain a printed sch at his 6/13 appt   [name]

## 2020-07-04 ENCOUNTER — Telehealth: Payer: Self-pay

## 2020-07-08 ENCOUNTER — Other Ambulatory Visit: Payer: Medicare Other

## 2020-07-08 ENCOUNTER — Other Ambulatory Visit: Payer: Self-pay

## 2020-07-08 ENCOUNTER — Ambulatory Visit: Payer: Medicare Other

## 2020-07-08 ENCOUNTER — Inpatient Hospital Stay: Payer: Medicare Other

## 2020-07-08 VITALS — BP 154/100 | HR 58 | Temp 99.3°F | Resp 18

## 2020-07-08 DIAGNOSIS — M25551 Pain in right hip: Secondary | ICD-10-CM | POA: Diagnosis not present

## 2020-07-08 DIAGNOSIS — Z79899 Other long term (current) drug therapy: Secondary | ICD-10-CM | POA: Diagnosis not present

## 2020-07-08 DIAGNOSIS — I723 Aneurysm of iliac artery: Secondary | ICD-10-CM | POA: Diagnosis not present

## 2020-07-08 DIAGNOSIS — I10 Essential (primary) hypertension: Secondary | ICD-10-CM | POA: Diagnosis not present

## 2020-07-08 DIAGNOSIS — K921 Melena: Secondary | ICD-10-CM | POA: Diagnosis not present

## 2020-07-08 DIAGNOSIS — M791 Myalgia, unspecified site: Secondary | ICD-10-CM | POA: Diagnosis not present

## 2020-07-08 DIAGNOSIS — G4733 Obstructive sleep apnea (adult) (pediatric): Secondary | ICD-10-CM | POA: Diagnosis not present

## 2020-07-08 DIAGNOSIS — D649 Anemia, unspecified: Secondary | ICD-10-CM | POA: Diagnosis not present

## 2020-07-08 DIAGNOSIS — Z952 Presence of prosthetic heart valve: Secondary | ICD-10-CM | POA: Diagnosis not present

## 2020-07-08 DIAGNOSIS — D693 Immune thrombocytopenic purpura: Secondary | ICD-10-CM

## 2020-07-08 DIAGNOSIS — R21 Rash and other nonspecific skin eruption: Secondary | ICD-10-CM | POA: Diagnosis not present

## 2020-07-08 DIAGNOSIS — M2550 Pain in unspecified joint: Secondary | ICD-10-CM | POA: Diagnosis not present

## 2020-07-08 DIAGNOSIS — I700 Atherosclerosis of aorta: Secondary | ICD-10-CM | POA: Diagnosis not present

## 2020-07-08 LAB — CBC WITH DIFFERENTIAL (CANCER CENTER ONLY)
Abs Immature Granulocytes: 0.04 10*3/uL (ref 0.00–0.07)
Basophils Absolute: 0 10*3/uL (ref 0.0–0.1)
Basophils Relative: 0 %
Eosinophils Absolute: 0.2 10*3/uL (ref 0.0–0.5)
Eosinophils Relative: 4 %
HCT: 34.9 % — ABNORMAL LOW (ref 39.0–52.0)
Hemoglobin: 11.4 g/dL — ABNORMAL LOW (ref 13.0–17.0)
Immature Granulocytes: 1 %
Lymphocytes Relative: 21 %
Lymphs Abs: 1 10*3/uL (ref 0.7–4.0)
MCH: 29.9 pg (ref 26.0–34.0)
MCHC: 32.7 g/dL (ref 30.0–36.0)
MCV: 91.6 fL (ref 80.0–100.0)
Monocytes Absolute: 0.6 10*3/uL (ref 0.1–1.0)
Monocytes Relative: 12 %
Neutro Abs: 3.1 10*3/uL (ref 1.7–7.7)
Neutrophils Relative %: 62 %
Platelet Count: 83 10*3/uL — ABNORMAL LOW (ref 150–400)
RBC: 3.81 MIL/uL — ABNORMAL LOW (ref 4.22–5.81)
RDW: 15 % (ref 11.5–15.5)
WBC Count: 4.8 10*3/uL (ref 4.0–10.5)
nRBC: 0 % (ref 0.0–0.2)

## 2020-07-08 MED ORDER — ROMIPLOSTIM 125 MCG ~~LOC~~ SOLR
1.0000 ug/kg | Freq: Once | SUBCUTANEOUS | Status: AC
Start: 2020-07-08 — End: 2020-07-08
  Administered 2020-07-08: 95 ug via SUBCUTANEOUS
  Filled 2020-07-08: qty 0.19

## 2020-07-08 NOTE — Patient Instructions (Signed)
Romiplostim injection What is this medication? ROMIPLOSTIM (roe mi PLOE stim) helps your body make more platelets. This medicine is used to treat low platelets caused by chronic idiopathic thrombocytopenic purpura (ITP) or a bone marrow syndrome caused by radiation sickness. This medicine may be used for other purposes; ask your health care provider or pharmacist if you have questions. COMMON BRAND NAME(S): Nplate What should I tell my care team before I take this medication? They need to know if you have any of these conditions: blood clots myelodysplastic syndrome an unusual or allergic reaction to romiplostim, mannitol, other medicines, foods, dyes, or preservatives pregnant or trying to get pregnant breast-feeding How should I use this medication? This medicine is injected under the skin. It is given by a health care provider in a hospital or clinic setting. A special MedGuide will be given to you before each treatment. Be sure to read this information carefully each time. Talk to your health care provider about the use of this medicine in children. While it may be prescribed for children as young as newborns for selected conditions, precautions do apply. Overdosage: If you think you have taken too much of this medicine contact a poison control center or emergency room at once. NOTE: This medicine is only for you. Do not share this medicine with others. What if I miss a dose? Keep appointments for follow-up doses. It is important not to miss your dose. Call your health care provider if you are unable to keep an appointment. What may interact with this medication? Interactions are not expected. This list may not describe all possible interactions. Give your health care provider a list of all the medicines, herbs, non-prescription drugs, or dietary supplements you use. Also tell them if you smoke, drink alcohol, or use illegal drugs. Some items may interact with your medicine. What should I  watch for while using this medication? Visit your health care provider for regular checks on your progress. You may need blood work done while you are taking this medicine. Your condition will be monitored carefully while you are receiving this medicine. It is important not to miss any appointments. What side effects may I notice from receiving this medication? Side effects that you should report to your doctor or health care professional as soon as possible: allergic reactions (skin rash, itching or hives; swelling of the face, lips, or tongue) bleeding (bloody or black, tarry stools; red or dark brown urine; spitting up blood or brown material that looks like coffee grounds; red spots on the skin; unusual bruising or bleeding from the eyes, gums, or nose) blood clot (chest pain; shortness of breath; pain, swelling, or warmth in the leg) stroke (changes in vision; confusion; trouble speaking or understanding; severe headaches; sudden numbness or weakness of the face, arm or leg; trouble walking; dizziness; loss of balance or coordination) Side effects that usually do not require medical attention (report to your doctor or health care professional if they continue or are bothersome): diarrhea dizziness headache joint pain muscle pain stomach pain trouble sleeping This list may not describe all possible side effects. Call your doctor for medical advice about side effects. You may report side effects to FDA at 1-800-FDA-1088. Where should I keep my medication? This medicine is given in a hospital or clinic. It will not be stored at home. NOTE: This sheet is a summary. It may not cover all possible information. If you have questions about this medicine, talk to your doctor, pharmacist, or health care provider.    2022 Elsevier/Gold Standard (2019-02-27 10:28:13)  

## 2020-07-11 DIAGNOSIS — U071 COVID-19: Secondary | ICD-10-CM | POA: Diagnosis not present

## 2020-07-15 ENCOUNTER — Ambulatory Visit: Payer: Medicare Other

## 2020-07-15 ENCOUNTER — Inpatient Hospital Stay: Payer: Medicare Other

## 2020-07-15 ENCOUNTER — Other Ambulatory Visit: Payer: Medicare Other

## 2020-07-15 ENCOUNTER — Other Ambulatory Visit: Payer: Self-pay

## 2020-07-15 DIAGNOSIS — I700 Atherosclerosis of aorta: Secondary | ICD-10-CM | POA: Diagnosis not present

## 2020-07-15 DIAGNOSIS — D693 Immune thrombocytopenic purpura: Secondary | ICD-10-CM | POA: Diagnosis not present

## 2020-07-15 DIAGNOSIS — K921 Melena: Secondary | ICD-10-CM | POA: Diagnosis not present

## 2020-07-15 DIAGNOSIS — M791 Myalgia, unspecified site: Secondary | ICD-10-CM | POA: Diagnosis not present

## 2020-07-15 DIAGNOSIS — Z79899 Other long term (current) drug therapy: Secondary | ICD-10-CM | POA: Diagnosis not present

## 2020-07-15 DIAGNOSIS — M2550 Pain in unspecified joint: Secondary | ICD-10-CM | POA: Diagnosis not present

## 2020-07-15 DIAGNOSIS — H26493 Other secondary cataract, bilateral: Secondary | ICD-10-CM | POA: Diagnosis not present

## 2020-07-15 DIAGNOSIS — H524 Presbyopia: Secondary | ICD-10-CM | POA: Diagnosis not present

## 2020-07-15 DIAGNOSIS — H401231 Low-tension glaucoma, bilateral, mild stage: Secondary | ICD-10-CM | POA: Diagnosis not present

## 2020-07-15 DIAGNOSIS — H04123 Dry eye syndrome of bilateral lacrimal glands: Secondary | ICD-10-CM | POA: Diagnosis not present

## 2020-07-15 LAB — CBC WITH DIFFERENTIAL (CANCER CENTER ONLY)
Abs Immature Granulocytes: 0.03 10*3/uL (ref 0.00–0.07)
Basophils Absolute: 0 10*3/uL (ref 0.0–0.1)
Basophils Relative: 0 %
Eosinophils Absolute: 0.2 10*3/uL (ref 0.0–0.5)
Eosinophils Relative: 4 %
HCT: 36.3 % — ABNORMAL LOW (ref 39.0–52.0)
Hemoglobin: 11.7 g/dL — ABNORMAL LOW (ref 13.0–17.0)
Immature Granulocytes: 1 %
Lymphocytes Relative: 25 %
Lymphs Abs: 1.4 10*3/uL (ref 0.7–4.0)
MCH: 30.2 pg (ref 26.0–34.0)
MCHC: 32.2 g/dL (ref 30.0–36.0)
MCV: 93.6 fL (ref 80.0–100.0)
Monocytes Absolute: 0.7 10*3/uL (ref 0.1–1.0)
Monocytes Relative: 12 %
Neutro Abs: 3.2 10*3/uL (ref 1.7–7.7)
Neutrophils Relative %: 58 %
Platelet Count: 104 10*3/uL — ABNORMAL LOW (ref 150–400)
RBC: 3.88 MIL/uL — ABNORMAL LOW (ref 4.22–5.81)
RDW: 14.6 % (ref 11.5–15.5)
WBC Count: 5.5 10*3/uL (ref 4.0–10.5)
nRBC: 0 % (ref 0.0–0.2)

## 2020-07-15 NOTE — Progress Notes (Unsigned)
Reviewed pt labs with patient who does not need injection today. Pt educated on parameters for injection and verbalized understanding. Pt aware of appointment for next week. Pt aware to call clinic with any questions or concerns. Pt verbalized understanding and had no further questions. Pt left clinic ambulatory in no distress.

## 2020-07-17 ENCOUNTER — Other Ambulatory Visit: Payer: Self-pay | Admitting: *Deleted

## 2020-07-17 NOTE — Patient Outreach (Addendum)
[facility]) Care Management  07/17/2020  Lonnie Hansen 27-Nov-1950 [id number]   Outgoing call placed to member, no answer, HIPAA compliant voice message left.  Will follow up within the next 3-4 business days.   Update:   Incoming call received from member, state he is doing well, continues with hematology follow up, last seen by PCP on 6/13.  Denies any urgent concerns, encouraged to contact this care manager with questions.  Agrees to follow up within the next month.   Goals Addressed             This Visit's Progress    COMPLETED: [facility] - Make and Keep All Appointments   On track    Timeframe:  Short-Term Goal Priority:  High Start Date:           5/5                  Expected End Date:    6/5                   Barriers: Knowledge    - ask family or friend for a ride - call to cancel if needed    Why is this important?   Part of staying healthy is seeing the doctor for follow-up care.  If you forget your appointments, there are some things you can do to stay on track.    Notes:   5/5 - Follow up with PCP on 5/6, GI on 5/23, and Cardioloy on 5/27  5/11 - Will see PCP again on 5/13 and oncology on 6/6  5/26 - Confirms follow up with PCP with hematology for labs.  Will see urologist today, cardiologist tomorrow, and hematologist on 6/6  6/22 - All appointments complete, will continue ongoing follow up with hematology to keep platelets normal      [facility] - Matintain My Quality of Life   On track    Timeframe:  Long-Range Goal Priority:  Medium Start Date:     5/5                        Expected End Date:   7/5                    Barriers: Knowledge    - complete a living will - discuss my treatment options with the doctor or nurse    Why is this important?   Having a long-term illness can be scary.  It can also be stressful for you and your caregiver.  These steps may help.    Notes:   5/5 - Educated on signs of bleeding.  Encouraged to  monitor blood pressure daily and record readings.  Blood pressure monitor sent.  5/11 - Discussed recent discharge instructions.  Call placed to PCP office to follow up on member's question about medications  5/26 - Denies any further bleeding, report blood levels are starting to increase  6/22 - Report he has started Romiplostim injections, platelet count has increased from 50,000 in May to over 100,000 with his recent labs on 6/20          [name], Therapist, sports, MSN [place] [facility] [phone]

## 2020-07-22 ENCOUNTER — Inpatient Hospital Stay: Payer: Medicare Other

## 2020-07-22 ENCOUNTER — Ambulatory Visit: Payer: Medicare Other

## 2020-07-22 ENCOUNTER — Other Ambulatory Visit: Payer: Self-pay

## 2020-07-22 DIAGNOSIS — I700 Atherosclerosis of aorta: Secondary | ICD-10-CM | POA: Diagnosis not present

## 2020-07-22 DIAGNOSIS — D693 Immune thrombocytopenic purpura: Secondary | ICD-10-CM | POA: Diagnosis not present

## 2020-07-22 DIAGNOSIS — K921 Melena: Secondary | ICD-10-CM | POA: Diagnosis not present

## 2020-07-22 DIAGNOSIS — Z79899 Other long term (current) drug therapy: Secondary | ICD-10-CM | POA: Diagnosis not present

## 2020-07-22 DIAGNOSIS — M2550 Pain in unspecified joint: Secondary | ICD-10-CM | POA: Diagnosis not present

## 2020-07-22 DIAGNOSIS — M791 Myalgia, unspecified site: Secondary | ICD-10-CM | POA: Diagnosis not present

## 2020-07-22 LAB — CBC WITH DIFFERENTIAL (CANCER CENTER ONLY)
Abs Immature Granulocytes: 0.02 10*3/uL (ref 0.00–0.07)
Basophils Absolute: 0 10*3/uL (ref 0.0–0.1)
Basophils Relative: 0 %
Eosinophils Absolute: 0.1 10*3/uL (ref 0.0–0.5)
Eosinophils Relative: 2 %
HCT: 35.9 % — ABNORMAL LOW (ref 39.0–52.0)
Hemoglobin: 12.1 g/dL — ABNORMAL LOW (ref 13.0–17.0)
Immature Granulocytes: 0 %
Lymphocytes Relative: 19 %
Lymphs Abs: 1.1 10*3/uL (ref 0.7–4.0)
MCH: 30.4 pg (ref 26.0–34.0)
MCHC: 33.7 g/dL (ref 30.0–36.0)
MCV: 90.2 fL (ref 80.0–100.0)
Monocytes Absolute: 0.6 10*3/uL (ref 0.1–1.0)
Monocytes Relative: 10 %
Neutro Abs: 4.1 10*3/uL (ref 1.7–7.7)
Neutrophils Relative %: 69 %
Platelet Count: 141 10*3/uL — ABNORMAL LOW (ref 150–400)
RBC: 3.98 MIL/uL — ABNORMAL LOW (ref 4.22–5.81)
RDW: 14 % (ref 11.5–15.5)
WBC Count: 6 10*3/uL (ref 4.0–10.5)
nRBC: 0 % (ref 0.0–0.2)

## 2020-07-30 ENCOUNTER — Other Ambulatory Visit: Payer: Self-pay

## 2020-07-30 ENCOUNTER — Inpatient Hospital Stay: Payer: Medicare Other

## 2020-07-30 ENCOUNTER — Inpatient Hospital Stay: Payer: Medicare Other | Attending: Hematology & Oncology

## 2020-07-30 VITALS — BP 155/90 | HR 65 | Temp 98.9°F | Resp 18

## 2020-07-30 DIAGNOSIS — D693 Immune thrombocytopenic purpura: Secondary | ICD-10-CM | POA: Insufficient documentation

## 2020-07-30 LAB — CBC WITH DIFFERENTIAL (CANCER CENTER ONLY)
Abs Immature Granulocytes: 0.04 10*3/uL (ref 0.00–0.07)
Basophils Absolute: 0 10*3/uL (ref 0.0–0.1)
Basophils Relative: 1 %
Eosinophils Absolute: 0.2 10*3/uL (ref 0.0–0.5)
Eosinophils Relative: 3 %
HCT: 36.4 % — ABNORMAL LOW (ref 39.0–52.0)
Hemoglobin: 11.9 g/dL — ABNORMAL LOW (ref 13.0–17.0)
Immature Granulocytes: 1 %
Lymphocytes Relative: 20 %
Lymphs Abs: 1.3 10*3/uL (ref 0.7–4.0)
MCH: 29.8 pg (ref 26.0–34.0)
MCHC: 32.7 g/dL (ref 30.0–36.0)
MCV: 91 fL (ref 80.0–100.0)
Monocytes Absolute: 0.7 10*3/uL (ref 0.1–1.0)
Monocytes Relative: 11 %
Neutro Abs: 4.1 10*3/uL (ref 1.7–7.7)
Neutrophils Relative %: 64 %
Platelet Count: 88 10*3/uL — ABNORMAL LOW (ref 150–400)
RBC: 4 MIL/uL — ABNORMAL LOW (ref 4.22–5.81)
RDW: 13.4 % (ref 11.5–15.5)
WBC Count: 6.3 10*3/uL (ref 4.0–10.5)
nRBC: 0 % (ref 0.0–0.2)

## 2020-07-30 MED ORDER — ROMIPLOSTIM 125 MCG ~~LOC~~ SOLR
95.0000 ug | Freq: Once | SUBCUTANEOUS | Status: AC
  Administered 2020-07-30: 95 ug via SUBCUTANEOUS
  Filled 2020-07-30: qty 0.19

## 2020-07-30 NOTE — Patient Instructions (Signed)
Romiplostim injection What is this medication? ROMIPLOSTIM (roe mi PLOE stim) helps your body make more platelets. This medicine is used to treat low platelets caused by chronic idiopathic thrombocytopenic purpura (ITP) or a bone marrow syndrome caused by radiation sickness. This medicine may be used for other purposes; ask your health care provider or pharmacist if you have questions. COMMON BRAND NAME(S): Nplate What should I tell my care team before I take this medication? They need to know if you have any of these conditions: blood clots myelodysplastic syndrome an unusual or allergic reaction to romiplostim, mannitol, other medicines, foods, dyes, or preservatives pregnant or trying to get pregnant breast-feeding How should I use this medication? This medicine is injected under the skin. It is given by a health care provider in a hospital or clinic setting. A special MedGuide will be given to you before each treatment. Be sure to read this information carefully each time. Talk to your health care provider about the use of this medicine in children. While it may be prescribed for children as young as newborns for selected conditions, precautions do apply. Overdosage: If you think you have taken too much of this medicine contact a poison control center or emergency room at once. NOTE: This medicine is only for you. Do not share this medicine with others. What if I miss a dose? Keep appointments for follow-up doses. It is important not to miss your dose. Call your health care provider if you are unable to keep an appointment. What may interact with this medication? Interactions are not expected. This list may not describe all possible interactions. Give your health care provider a list of all the medicines, herbs, non-prescription drugs, or dietary supplements you use. Also tell them if you smoke, drink alcohol, or use illegal drugs. Some items may interact with your medicine. What should I  watch for while using this medication? Visit your health care provider for regular checks on your progress. You may need blood work done while you are taking this medicine. Your condition will be monitored carefully while you are receiving this medicine. It is important not to miss any appointments. What side effects may I notice from receiving this medication? Side effects that you should report to your doctor or health care professional as soon as possible: allergic reactions (skin rash, itching or hives; swelling of the face, lips, or tongue) bleeding (bloody or black, tarry stools; red or dark brown urine; spitting up blood or brown material that looks like coffee grounds; red spots on the skin; unusual bruising or bleeding from the eyes, gums, or nose) blood clot (chest pain; shortness of breath; pain, swelling, or warmth in the leg) stroke (changes in vision; confusion; trouble speaking or understanding; severe headaches; sudden numbness or weakness of the face, arm or leg; trouble walking; dizziness; loss of balance or coordination) Side effects that usually do not require medical attention (report to your doctor or health care professional if they continue or are bothersome): diarrhea dizziness headache joint pain muscle pain stomach pain trouble sleeping This list may not describe all possible side effects. Call your doctor for medical advice about side effects. You may report side effects to FDA at 1-800-FDA-1088. Where should I keep my medication? This medicine is given in a hospital or clinic. It will not be stored at home. NOTE: This sheet is a summary. It may not cover all possible information. If you have questions about this medicine, talk to your doctor, pharmacist, or health care provider.    2022 Elsevier/Gold Standard (2019-02-27 10:28:13)  

## 2020-08-05 ENCOUNTER — Inpatient Hospital Stay: Payer: Medicare Other

## 2020-08-05 ENCOUNTER — Encounter: Payer: Self-pay | Admitting: Hematology & Oncology

## 2020-08-05 ENCOUNTER — Inpatient Hospital Stay (HOSPITAL_BASED_OUTPATIENT_CLINIC_OR_DEPARTMENT_OTHER): Payer: Medicare Other | Admitting: Hematology & Oncology

## 2020-08-05 ENCOUNTER — Telehealth: Payer: Self-pay

## 2020-08-05 ENCOUNTER — Other Ambulatory Visit: Payer: Self-pay

## 2020-08-05 VITALS — BP 156/88 | HR 61 | Temp 98.7°F | Resp 20 | Wt 206.8 lb

## 2020-08-05 DIAGNOSIS — D693 Immune thrombocytopenic purpura: Secondary | ICD-10-CM

## 2020-08-05 LAB — CBC WITH DIFFERENTIAL (CANCER CENTER ONLY)
Abs Immature Granulocytes: 0.02 10*3/uL (ref 0.00–0.07)
Basophils Absolute: 0 10*3/uL (ref 0.0–0.1)
Basophils Relative: 0 %
Eosinophils Absolute: 0.2 10*3/uL (ref 0.0–0.5)
Eosinophils Relative: 3 %
HCT: 36.5 % — ABNORMAL LOW (ref 39.0–52.0)
Hemoglobin: 12.1 g/dL — ABNORMAL LOW (ref 13.0–17.0)
Immature Granulocytes: 0 %
Lymphocytes Relative: 24 %
Lymphs Abs: 1.3 10*3/uL (ref 0.7–4.0)
MCH: 29.8 pg (ref 26.0–34.0)
MCHC: 33.2 g/dL (ref 30.0–36.0)
MCV: 89.9 fL (ref 80.0–100.0)
Monocytes Absolute: 0.6 10*3/uL (ref 0.1–1.0)
Monocytes Relative: 11 %
Neutro Abs: 3.5 10*3/uL (ref 1.7–7.7)
Neutrophils Relative %: 62 %
Platelet Count: 97 10*3/uL — ABNORMAL LOW (ref 150–400)
RBC: 4.06 MIL/uL — ABNORMAL LOW (ref 4.22–5.81)
RDW: 13.4 % (ref 11.5–15.5)
WBC Count: 5.6 10*3/uL (ref 4.0–10.5)
nRBC: 0 % (ref 0.0–0.2)

## 2020-08-05 MED ORDER — ROMIPLOSTIM 125 MCG ~~LOC~~ SOLR
1.0000 ug/kg | Freq: Once | SUBCUTANEOUS | Status: AC
  Administered 2020-08-05: 95 ug via SUBCUTANEOUS
  Filled 2020-08-05: qty 0.19

## 2020-08-05 NOTE — Patient Instructions (Signed)
Romiplostim injection What is this medication? ROMIPLOSTIM (roe mi PLOE stim) helps your body make more platelets. This medicine is used to treat low platelets caused by chronic idiopathic thrombocytopenic purpura (ITP) or a bone marrow syndrome caused by radiation sickness. This medicine may be used for other purposes; ask your health care provider or pharmacist if you have questions. COMMON BRAND NAME(S): Nplate What should I tell my care team before I take this medication? They need to know if you have any of these conditions: blood clots myelodysplastic syndrome an unusual or allergic reaction to romiplostim, mannitol, other medicines, foods, dyes, or preservatives pregnant or trying to get pregnant breast-feeding How should I use this medication? This medicine is injected under the skin. It is given by a health care provider in a hospital or clinic setting. A special MedGuide will be given to you before each treatment. Be sure to read this information carefully each time. Talk to your health care provider about the use of this medicine in children. While it may be prescribed for children as young as newborns for selected conditions, precautions do apply. Overdosage: If you think you have taken too much of this medicine contact a poison control center or emergency room at once. NOTE: This medicine is only for you. Do not share this medicine with others. What if I miss a dose? Keep appointments for follow-up doses. It is important not to miss your dose. Call your health care provider if you are unable to keep an appointment. What may interact with this medication? Interactions are not expected. This list may not describe all possible interactions. Give your health care provider a list of all the medicines, herbs, non-prescription drugs, or dietary supplements you use. Also tell them if you smoke, drink alcohol, or use illegal drugs. Some items may interact with your medicine. What should I  watch for while using this medication? Visit your health care provider for regular checks on your progress. You may need blood work done while you are taking this medicine. Your condition will be monitored carefully while you are receiving this medicine. It is important not to miss any appointments. What side effects may I notice from receiving this medication? Side effects that you should report to your doctor or health care professional as soon as possible: allergic reactions (skin rash, itching or hives; swelling of the face, lips, or tongue) bleeding (bloody or black, tarry stools; red or dark brown urine; spitting up blood or brown material that looks like coffee grounds; red spots on the skin; unusual bruising or bleeding from the eyes, gums, or nose) blood clot (chest pain; shortness of breath; pain, swelling, or warmth in the leg) stroke (changes in vision; confusion; trouble speaking or understanding; severe headaches; sudden numbness or weakness of the face, arm or leg; trouble walking; dizziness; loss of balance or coordination) Side effects that usually do not require medical attention (report to your doctor or health care professional if they continue or are bothersome): diarrhea dizziness headache joint pain muscle pain stomach pain trouble sleeping This list may not describe all possible side effects. Call your doctor for medical advice about side effects. You may report side effects to FDA at 1-800-FDA-1088. Where should I keep my medication? This medicine is given in a hospital or clinic. It will not be stored at home. NOTE: This sheet is a summary. It may not cover all possible information. If you have questions about this medicine, talk to your doctor, pharmacist, or health care provider.    2022 Elsevier/Gold Standard (2019-02-27 10:28:13)  

## 2020-08-05 NOTE — Progress Notes (Signed)
Hematology and Oncology Follow Up Visit  Lonnie Hansen [id number] 03/23/1950 70 y.o. 08/05/2020   Principle Diagnosis:  Chronic ITP  Current Therapy:   Weekly Nplate to keep platelet count over 100,000     Interim History:  Lonnie Hansen is back for follow-up.  He is doing pretty well.  He is responding to the Nplate.  His platelet count is 97,000 today.  We will go ahead and give a dose of Nplate.  His main problem is the right hip.  I told that he can have hip surgery at any time.  His platelet count not to be a problem.  We concerning his platelet count up so that the orthopedic surgeon will be confident to do the surgery.  He has had no bleeding.  He has had no bruising.  He has had no issues with nausea or vomiting.  There is been no problems with fever.  He has avoided the coronavirus.  Currently, his performance status is ECOG 1.    Medications:  Current Outpatient Medications:    acetaminophen (TYLENOL) 325 MG tablet, Take 2 tablets (650 mg total) by mouth every 6 (six) hours as needed for mild pain or fever., Disp: , Rfl:    amLODipine (NORVASC) 10 MG tablet, Take 10 mg by mouth daily at 12 noon., Disp: , Rfl:    atenolol (TENORMIN) 25 MG tablet, Take 25 mg by mouth daily., Disp: , Rfl:    Ensure Plus (ENSURE PLUS) LIQD, Take 237 mLs by mouth daily., Disp: , Rfl:    ferrous sulfate 325 (65 FE) MG tablet, Take 325 mg by mouth daily at 12 noon., Disp: , Rfl:    latanoprost (XALATAN) 0.005 % ophthalmic solution, Place 1 drop into both eyes 2 (two) times daily., Disp: , Rfl:    Multiple Vitamin (MULTIVITAMIN WITH MINERALS) TABS tablet, Take 1 tablet by mouth daily at 12 noon., Disp: , Rfl:    pantoprazole (PROTONIX) 40 MG tablet, Take 20 mg by mouth 2 (two) times daily., Disp: , Rfl:    polyethylene glycol (MIRALAX / GLYCOLAX) 17 g packet, Take 17 g by mouth daily., Disp: 14 each, Rfl: 0   psyllium (HYDROCIL/METAMUCIL) 95 % PACK, Take 1 packet by mouth daily., Disp: 240 each,  Rfl: 3   rosuvastatin (CRESTOR) 10 MG tablet, Take 1 tablet by mouth once daily (Patient taking differently: Take 10 mg by mouth daily at 12 noon.), Disp: 90 tablet, Rfl: 1   triamcinolone cream (KENALOG) 0.1 %, Apply 1 application topically 2 (two) times daily as needed (facial itching/rash)., Disp: , Rfl:    fluticasone (FLONASE) 50 MCG/ACT nasal spray, Place 1 spray into both nostrils daily as needed for allergies or rhinitis., Disp: , Rfl:    montelukast (SINGULAIR) 10 MG tablet, Take 10 mg by mouth at bedtime., Disp: , Rfl:   Allergies: No Known Allergies  Past Medical History, Surgical history, Social history, and Family History were reviewed and updated.  Review of Systems: Review of Systems  Constitutional: Negative.   HENT:  Negative.    Eyes: Negative.   Respiratory: Negative.    Cardiovascular: Negative.   Gastrointestinal:  Positive for blood in stool.  Endocrine: Negative.   Genitourinary: Negative.    Musculoskeletal:  Positive for arthralgias and myalgias.  Hematological: Negative.   Psychiatric/Behavioral: Negative.     Physical Exam:  weight is 206 lb 12.8 oz (93.8 kg). His oral temperature is 98.7 F (37.1 C). His blood pressure is 156/88 (abnormal) and his  pulse is 61. His respiration is 20 and oxygen saturation is 100%.   Wt Readings from Last 3 Encounters:  08/05/20 206 lb 12.8 oz (93.8 kg)  07/01/20 207 lb (93.9 kg)  06/21/20 208 lb (94.3 kg)    Physical Exam Vitals reviewed.  HENT:     Head: Normocephalic and atraumatic.  Eyes:     Pupils: Pupils are equal, round, and reactive to light.  Cardiovascular:     Rate and Rhythm: Normal rate and regular rhythm.     Heart sounds: Normal heart sounds.  Pulmonary:     Effort: Pulmonary effort is normal.     Breath sounds: Normal breath sounds.  Abdominal:     General: Bowel sounds are normal.     Palpations: Abdomen is soft.  Musculoskeletal:        General: No tenderness or deformity. Normal range of  motion.     Cervical back: Normal range of motion.  Lymphadenopathy:     Cervical: No cervical adenopathy.  Skin:    General: Skin is warm and dry.     Findings: No erythema or rash.  Neurological:     Mental Status: He is alert and oriented to person, place, and time.  Psychiatric:        Behavior: Behavior normal.        Thought Content: Thought content normal.        Judgment: Judgment normal.     Lab Results  Component Value Date   WBC 5.6 08/05/2020   HGB 12.1 (L) 08/05/2020   HCT 36.5 (L) 08/05/2020   MCV 89.9 08/05/2020   PLT 97 (L) 08/05/2020     Chemistry      Component Value Date/Time   NA 142 07/01/2020 0809   NA 140 05/11/2019 0946   K 3.3 (L) 07/01/2020 0809   CL 105 07/01/2020 0809   CO2 28 07/01/2020 0809   BUN 15 07/01/2020 0809   BUN 17 05/11/2019 0946   CREATININE 1.15 07/01/2020 0809   CREATININE 0.75 04/10/2016 1132      Component Value Date/Time   CALCIUM 10.0 07/01/2020 0809   ALKPHOS 42 07/01/2020 0809   AST 16 07/01/2020 0809   ALT 7 07/01/2020 0809   BILITOT 0.9 07/01/2020 0809     Impression and Plan:  Lonnie Hansen is a very nice 70 year old African-American male.  He has chronic ITP.  I will go ahead and give him a dose of Nplate today.  Again, we want to get his platelet count up.  I told him to call the orthopedic surgeon.  If there are any questions with respect to surgery, the orthopedic surgeon can always call me.  I think we can hold off on having him come in next week.  We will have him come back in about 3 weeks and we will see how everything looks.    Volanda Napoleon, MD 7/11/20221:08 PM

## 2020-08-15 ENCOUNTER — Other Ambulatory Visit: Payer: Self-pay | Admitting: *Deleted

## 2020-08-15 NOTE — Patient Outreach (Signed)
[facility]) Care Management  08/15/2020  Lonnie Hansen [birth year] [id number]   Outgoing call placed to member, state he is doing well.  Currently having an issue with a skin rash.  He has seen his PCP, shingles ruled out.  Has upcoming appointment with dermatology.  Denies any urgent concerns, encouraged to contact this care manager with questions.  Agrees to follow up within the next month.   Goals Addressed             This Visit's Progress    [facility] - Matintain My Quality of Life   On track    Timeframe:  Long-Range Goal Priority:  Medium Start Date:     5/5                        Expected End Date:   8/5                   Barriers: Knowledge    - complete a living will - discuss my treatment options with the doctor or nurse    Why is this important?   Having a long-term illness can be scary.  It can also be stressful for you and your caregiver.  These steps may help.    Notes:   5/5 - Educated on signs of bleeding.  Encouraged to monitor blood pressure daily and record readings.  Blood pressure monitor sent.  5/11 - Discussed recent discharge instructions.  Call placed to PCP office to follow up on member's question about medications  5/26 - Denies any further bleeding, report blood levels are starting to increase  6/22 - Report he has started Romiplostim injections, platelet count has increased from 50,000 in May to over 100,000 with his recent labs on 6/20  7/21 - Continues follow up with hematology to monitor platelet levels.  Denies bleeding       [name], RN, MSN [place] [phone]

## 2020-08-26 ENCOUNTER — Ambulatory Visit: Payer: Medicare Other

## 2020-08-26 ENCOUNTER — Other Ambulatory Visit: Payer: Self-pay | Admitting: *Deleted

## 2020-08-26 ENCOUNTER — Telehealth: Payer: Self-pay

## 2020-08-26 ENCOUNTER — Inpatient Hospital Stay: Payer: Medicare Other

## 2020-08-26 ENCOUNTER — Inpatient Hospital Stay: Payer: Medicare Other | Admitting: Hematology & Oncology

## 2020-08-26 ENCOUNTER — Other Ambulatory Visit: Payer: Self-pay

## 2020-08-26 ENCOUNTER — Ambulatory Visit (INDEPENDENT_AMBULATORY_CARE_PROVIDER_SITE_OTHER): Payer: Medicare Other

## 2020-08-26 ENCOUNTER — Inpatient Hospital Stay: Payer: Medicare Other | Attending: Hematology & Oncology

## 2020-08-26 ENCOUNTER — Inpatient Hospital Stay (HOSPITAL_BASED_OUTPATIENT_CLINIC_OR_DEPARTMENT_OTHER): Payer: Medicare Other | Admitting: Hematology & Oncology

## 2020-08-26 ENCOUNTER — Encounter: Payer: Self-pay | Admitting: Hematology & Oncology

## 2020-08-26 VITALS — BP 171/74 | HR 43 | Temp 98.3°F | Resp 18 | Wt 209.0 lb

## 2020-08-26 DIAGNOSIS — R002 Palpitations: Secondary | ICD-10-CM

## 2020-08-26 DIAGNOSIS — D693 Immune thrombocytopenic purpura: Secondary | ICD-10-CM

## 2020-08-26 DIAGNOSIS — M2550 Pain in unspecified joint: Secondary | ICD-10-CM | POA: Diagnosis not present

## 2020-08-26 DIAGNOSIS — Z79899 Other long term (current) drug therapy: Secondary | ICD-10-CM | POA: Insufficient documentation

## 2020-08-26 DIAGNOSIS — R008 Other abnormalities of heart beat: Secondary | ICD-10-CM | POA: Diagnosis not present

## 2020-08-26 DIAGNOSIS — K921 Melena: Secondary | ICD-10-CM | POA: Diagnosis not present

## 2020-08-26 DIAGNOSIS — R42 Dizziness and giddiness: Secondary | ICD-10-CM | POA: Insufficient documentation

## 2020-08-26 DIAGNOSIS — M791 Myalgia, unspecified site: Secondary | ICD-10-CM | POA: Insufficient documentation

## 2020-08-26 DIAGNOSIS — I2510 Atherosclerotic heart disease of native coronary artery without angina pectoris: Secondary | ICD-10-CM

## 2020-08-26 LAB — CBC WITH DIFFERENTIAL (CANCER CENTER ONLY)
Abs Immature Granulocytes: 0.02 10*3/uL (ref 0.00–0.07)
Basophils Absolute: 0 10*3/uL (ref 0.0–0.1)
Basophils Relative: 0 %
Eosinophils Absolute: 0.2 10*3/uL (ref 0.0–0.5)
Eosinophils Relative: 3 %
HCT: 39 % (ref 39.0–52.0)
Hemoglobin: 12.7 g/dL — ABNORMAL LOW (ref 13.0–17.0)
Immature Granulocytes: 0 %
Lymphocytes Relative: 22 %
Lymphs Abs: 1.3 10*3/uL (ref 0.7–4.0)
MCH: 29.6 pg (ref 26.0–34.0)
MCHC: 32.6 g/dL (ref 30.0–36.0)
MCV: 90.9 fL (ref 80.0–100.0)
Monocytes Absolute: 0.7 10*3/uL (ref 0.1–1.0)
Monocytes Relative: 11 %
Neutro Abs: 3.8 10*3/uL (ref 1.7–7.7)
Neutrophils Relative %: 64 %
Platelet Count: 99 10*3/uL — ABNORMAL LOW (ref 150–400)
RBC: 4.29 MIL/uL (ref 4.22–5.81)
RDW: 13.2 % (ref 11.5–15.5)
WBC Count: 6.1 10*3/uL (ref 4.0–10.5)
nRBC: 0 % (ref 0.0–0.2)

## 2020-08-26 LAB — CMP (CANCER CENTER ONLY)
ALT: 9 U/L (ref 0–44)
AST: 17 U/L (ref 15–41)
Albumin: 4.4 g/dL (ref 3.5–5.0)
Alkaline Phosphatase: 49 U/L (ref 38–126)
Anion gap: 8 (ref 5–15)
BUN: 16 mg/dL (ref 8–23)
CO2: 27 mmol/L (ref 22–32)
Calcium: 9.7 mg/dL (ref 8.9–10.3)
Chloride: 103 mmol/L (ref 98–111)
Creatinine: 1 mg/dL (ref 0.61–1.24)
GFR, Estimated: >60 mL/min (ref >60)
Glucose, Bld: 120 mg/dL — ABNORMAL HIGH (ref 70–99)
Potassium: 3.6 mmol/L (ref 3.5–5.1)
Sodium: 138 mmol/L (ref 135–145)
Total Bilirubin: 0.5 mg/dL (ref 0.3–1.2)
Total Protein: 7.3 g/dL (ref 6.5–8.1)

## 2020-08-26 LAB — SAVE SMEAR(SSMR), FOR PROVIDER SLIDE REVIEW

## 2020-08-26 MED ORDER — ROMIPLOSTIM 125 MCG ~~LOC~~ SOLR
1.0000 ug/kg | Freq: Once | SUBCUTANEOUS | Status: DC
  Filled 2020-08-26: qty 0.19

## 2020-08-26 NOTE — Progress Notes (Signed)
Hematology and Oncology Follow Up Visit  JERAN HILTZ [id number] [birth year] 70 y.o. 08/26/2020   Principle Diagnosis:  Chronic ITP  Current Therapy:   Weekly Nplate to keep platelet count over 100,000     Interim History:  Mr. Etsitty is back for follow-up.  He is not feeling all that well.  He says he just feels tired.  He has had a little bit of dizziness.  He had a pulse of 49 earlier in the office.  I did a EKG on him.  He was in bigeminy.  He does have a cardiologist.  His cardiologist Dr. Martinique.  I called his office.  I spoke with one of his associates.  Dr. Ellyn Hack said that he would call in a monitor for Mr. Borneman.  He told us to have Mr. Stoker increase his atenolol to 50 mg a day.  Mr. Stecher has had no problems with bleeding.  Is platelet count is 99,000.  I will hold the Nplate given the cardiac issues.  He still is having problems with his right hip.  He wants to wait until next year to try to have surgery to repair the right hip.  He has had no change in bowel or bladder habits.  There is been no issues with nausea or vomiting.  He has had no headache.  He has had no bruising.  Overall, his performance status is ECOG 1.    Medications:  Current Outpatient Medications:    losartan-hydrochlorothiazide (HYZAAR) 100-12.5 MG tablet, Take 1 tablet by mouth daily., Disp: , Rfl:    montelukast (SINGULAIR) 10 MG tablet, Take 10 mg by mouth daily., Disp: , Rfl:    acetaminophen (TYLENOL) 325 MG tablet, Take 2 tablets (650 mg total) by mouth every 6 (six) hours as needed for mild pain or fever., Disp: , Rfl:    amLODipine (NORVASC) 10 MG tablet, Take 10 mg by mouth daily at 12 noon., Disp: , Rfl:    atenolol (TENORMIN) 25 MG tablet, Take 1 tablet (25 mg total) by mouth 2 (two) times daily., Disp: 180 tablet, Rfl: 3   Ensure Plus (ENSURE PLUS) LIQD, Take 237 mLs by mouth daily., Disp: , Rfl:    ferrous sulfate 325 (65 FE) MG tablet, Take 325 mg by mouth daily at 12 noon.,  Disp: , Rfl:    fluticasone (FLONASE) 50 MCG/ACT nasal spray, Place 1 spray into both nostrils daily as needed for allergies or rhinitis., Disp: , Rfl:    latanoprost (XALATAN) 0.005 % ophthalmic solution, Place 1 drop into both eyes 2 (two) times daily., Disp: , Rfl:    montelukast (SINGULAIR) 10 MG tablet, Take 10 mg by mouth at bedtime., Disp: , Rfl:    Multiple Vitamin (MULTIVITAMIN WITH MINERALS) TABS tablet, Take 1 tablet by mouth daily at 12 noon., Disp: , Rfl:    pantoprazole (PROTONIX) 20 MG tablet, Take 20 mg by mouth 2 (two) times daily., Disp: , Rfl:    polyethylene glycol (MIRALAX / GLYCOLAX) 17 g packet, Take 17 g by mouth daily., Disp: 14 each, Rfl: 0   psyllium (HYDROCIL/METAMUCIL) 95 % PACK, Take 1 packet by mouth daily., Disp: 240 each, Rfl: 3   rosuvastatin (CRESTOR) 10 MG tablet, Take 1 tablet by mouth once daily (Patient taking differently: Take 10 mg by mouth daily at 12 noon.), Disp: 90 tablet, Rfl: 1   triamcinolone cream (KENALOG) 0.1 %, Apply 1 application topically 2 (two) times daily as needed (facial itching/rash)., Disp: , Rfl:  Allergies: No Known Allergies  Past Medical History, Surgical history, Social history, and Family History were reviewed and updated.  Review of Systems: Review of Systems  Constitutional: Negative.   HENT:  Negative.    Eyes: Negative.   Respiratory: Negative.    Cardiovascular: Negative.   Gastrointestinal:  Positive for blood in stool.  Endocrine: Negative.   Genitourinary: Negative.    Musculoskeletal:  Positive for arthralgias and myalgias.  Hematological: Negative.   Psychiatric/Behavioral: Negative.     Physical Exam:  weight is 209 lb (94.8 kg). His oral temperature is 98.3 F (36.8 C). His blood pressure is 171/74 (abnormal) and his pulse is 43 (abnormal). His respiration is 18 and oxygen saturation is 99%.   Wt Readings from Last 3 Encounters:  08/26/20 209 lb (94.8 kg)  08/05/20 206 lb 12.8 oz (93.8 kg)  07/01/20  207 lb (93.9 kg)    Physical Exam Vitals reviewed.  HENT:     Head: Normocephalic and atraumatic.  Eyes:     Pupils: Pupils are equal, round, and reactive to light.  Cardiovascular:     Rate and Rhythm: Normal rate and regular rhythm.     Heart sounds: Normal heart sounds.  Pulmonary:     Effort: Pulmonary effort is normal.     Breath sounds: Normal breath sounds.  Abdominal:     General: Bowel sounds are normal.     Palpations: Abdomen is soft.  Musculoskeletal:        General: No tenderness or deformity. Normal range of motion.     Cervical back: Normal range of motion.  Lymphadenopathy:     Cervical: No cervical adenopathy.  Skin:    General: Skin is warm and dry.     Findings: No erythema or rash.  Neurological:     Mental Status: He is alert and oriented to person, place, and time.  Psychiatric:        Behavior: Behavior normal.        Thought Content: Thought content normal.        Judgment: Judgment normal.     Lab Results  Component Value Date   WBC 6.1 08/26/2020   HGB 12.7 (L) 08/26/2020   HCT 39.0 08/26/2020   MCV 90.9 08/26/2020   PLT 99 (L) 08/26/2020     Chemistry      Component Value Date/Time   NA 138 08/26/2020 1014   NA 140 05/11/2019 0946   K 3.6 08/26/2020 1014   CL 103 08/26/2020 1014   CO2 27 08/26/2020 1014   BUN 16 08/26/2020 1014   BUN 17 05/11/2019 0946   CREATININE 1.00 08/26/2020 1014   CREATININE 0.75 04/10/2016 1132      Component Value Date/Time   CALCIUM 9.7 08/26/2020 1014   ALKPHOS 49 08/26/2020 1014   AST 17 08/26/2020 1014   ALT 9 08/26/2020 1014   BILITOT 0.5 08/26/2020 1014     Impression and Plan:  Mr. Brittian is a very nice 70 year old African-American male.  He has chronic ITP.  Again, I am going to hold the Nplate because of the cardiac issues.  He has had no problems with his platelet count at this level.  As such I think we can hold off on the Nplate.  I do not anticipate any type of invasive issues  ongoing.  Hopefully, he will not need a cardiac cath.  A lot will depend on what is found with the cardiac monitor and what his cardiologist has to say.  We will plan to get him back to see [place] in about 6 weeks.  I think this would be reasonable for follow-up.     Volanda Napoleon, MD 8/1/20224:46 PM

## 2020-08-26 NOTE — Telephone Encounter (Signed)
Returned call to patient no answer.Left message on personal voice mail to call back. 

## 2020-08-26 NOTE — Telephone Encounter (Signed)
Lonnie Hansen is calling wanting an appt in regards to this with Dr. Martinique as soon as possible. He states he believes his blockages are needing to be stented up like Dr. Martinique advised in the past would occur. Please advise.

## 2020-08-26 NOTE — Telephone Encounter (Signed)
DOD Dr.Harding received a call from [place].Patient in his office with irregular heart beat.Dr.Harding advised to increase Atenolol to 25 mg twice a day.7 day Zio monitor Schedule follow up.Message sent to scheduler to schedule.

## 2020-08-26 NOTE — Progress Notes (Unsigned)
Patient enrolled for [employer] to mail a 7 day ZIO XT monitor to address on file.

## 2020-08-26 NOTE — Patient Instructions (Signed)
Romiplostim injection What is this medication? ROMIPLOSTIM (roe mi PLOE stim) helps your body make more platelets. This medicine is used to treat low platelets caused by chronic idiopathic thrombocytopenic purpura (ITP) or a bone marrow syndrome caused by radiation sickness. This medicine may be used for other purposes; ask your health care provider or pharmacist if you have questions. COMMON BRAND NAME(S): Nplate What should I tell my care team before I take this medication? They need to know if you have any of these conditions: blood clots myelodysplastic syndrome an unusual or allergic reaction to romiplostim, mannitol, other medicines, foods, dyes, or preservatives pregnant or trying to get pregnant breast-feeding How should I use this medication? This medicine is injected under the skin. It is given by a health care provider in a hospital or clinic setting. A special MedGuide will be given to you before each treatment. Be sure to read this information carefully each time. Talk to your health care provider about the use of this medicine in children. While it may be prescribed for children as young as newborns for selected conditions, precautions do apply. Overdosage: If you think you have taken too much of this medicine contact a poison control center or emergency room at once. NOTE: This medicine is only for you. Do not share this medicine with others. What if I miss a dose? Keep appointments for follow-up doses. It is important not to miss your dose. Call your health care provider if you are unable to keep an appointment. What may interact with this medication? Interactions are not expected. This list may not describe all possible interactions. Give your health care provider a list of all the medicines, herbs, non-prescription drugs, or dietary supplements you use. Also tell them if you smoke, drink alcohol, or use illegal drugs. Some items may interact with your medicine. What should I  watch for while using this medication? Visit your health care provider for regular checks on your progress. You may need blood work done while you are taking this medicine. Your condition will be monitored carefully while you are receiving this medicine. It is important not to miss any appointments. What side effects may I notice from receiving this medication? Side effects that you should report to your doctor or health care professional as soon as possible: allergic reactions (skin rash, itching or hives; swelling of the face, lips, or tongue) bleeding (bloody or black, tarry stools; red or dark brown urine; spitting up blood or brown material that looks like coffee grounds; red spots on the skin; unusual bruising or bleeding from the eyes, gums, or nose) blood clot (chest pain; shortness of breath; pain, swelling, or warmth in the leg) stroke (changes in vision; confusion; trouble speaking or understanding; severe headaches; sudden numbness or weakness of the face, arm or leg; trouble walking; dizziness; loss of balance or coordination) Side effects that usually do not require medical attention (report to your doctor or health care professional if they continue or are bothersome): diarrhea dizziness headache joint pain muscle pain stomach pain trouble sleeping This list may not describe all possible side effects. Call your doctor for medical advice about side effects. You may report side effects to FDA at 1-800-FDA-1088. Where should I keep my medication? This medicine is given in a hospital or clinic. It will not be stored at home. NOTE: This sheet is a summary. It may not cover all possible information. If you have questions about this medicine, talk to your doctor, pharmacist, or health care provider.    2022 Elsevier/Gold Standard (2019-02-27 10:28:13)  

## 2020-08-26 NOTE — Telephone Encounter (Signed)
After speaking with cardiology, Dr [name] requested this RN contact patient with new Atenolol instructions. Pt verbalizes understanding using teachback to take 1 25mg  pill twice a day. He is aware to expect a call from their office for possible heart monitor. dph

## 2020-08-27 ENCOUNTER — Telehealth: Payer: Self-pay

## 2020-08-27 NOTE — Telephone Encounter (Signed)
Spoke to patient he stated he wanted to ask Dr.Jordan if he needs to have a cardiac cath.Stated his B/P was elevated at Dr.Ennever's office yesterday.Stated he has not had any chest pain.No sob.Stated he is concerned his brother never had symptoms but had heart blockages.Advised Dr.Jordan is out of office this week.I will speak to him on Monday 8/8 and call you back.

## 2020-08-27 NOTE — Telephone Encounter (Signed)
Pt is returning call.  

## 2020-08-29 DIAGNOSIS — D693 Immune thrombocytopenic purpura: Secondary | ICD-10-CM | POA: Diagnosis not present

## 2020-08-29 DIAGNOSIS — I10 Essential (primary) hypertension: Secondary | ICD-10-CM | POA: Diagnosis not present

## 2020-08-29 DIAGNOSIS — R002 Palpitations: Secondary | ICD-10-CM | POA: Diagnosis not present

## 2020-08-29 DIAGNOSIS — I2510 Atherosclerotic heart disease of native coronary artery without angina pectoris: Secondary | ICD-10-CM | POA: Diagnosis not present

## 2020-09-02 ENCOUNTER — Other Ambulatory Visit: Payer: Self-pay | Admitting: Cardiology

## 2020-09-02 DIAGNOSIS — R002 Palpitations: Secondary | ICD-10-CM

## 2020-09-02 DIAGNOSIS — I25118 Atherosclerotic heart disease of native coronary artery with other forms of angina pectoris: Secondary | ICD-10-CM

## 2020-09-02 NOTE — Telephone Encounter (Signed)
Called patient spoke to [place] he advised ok to have a stress myoview.Scheduler will call back to schedule.

## 2020-09-03 ENCOUNTER — Telehealth (HOSPITAL_COMMUNITY): Payer: Self-pay | Admitting: *Deleted

## 2020-09-03 NOTE — Telephone Encounter (Signed)
Close encounter 

## 2020-09-05 DIAGNOSIS — L509 Urticaria, unspecified: Secondary | ICD-10-CM | POA: Diagnosis not present

## 2020-09-05 DIAGNOSIS — L720 Epidermal cyst: Secondary | ICD-10-CM | POA: Diagnosis not present

## 2020-09-05 DIAGNOSIS — D485 Neoplasm of uncertain behavior of skin: Secondary | ICD-10-CM | POA: Diagnosis not present

## 2020-09-05 DIAGNOSIS — L989 Disorder of the skin and subcutaneous tissue, unspecified: Secondary | ICD-10-CM | POA: Diagnosis not present

## 2020-09-05 DIAGNOSIS — L218 Other seborrheic dermatitis: Secondary | ICD-10-CM | POA: Diagnosis not present

## 2020-09-06 ENCOUNTER — Other Ambulatory Visit: Payer: Self-pay

## 2020-09-06 ENCOUNTER — Ambulatory Visit (HOSPITAL_COMMUNITY)
Admission: RE | Admit: 2020-09-06 | Discharge: 2020-09-06 | Disposition: A | Discharge status: Home / Self Care | Payer: Medicare Other | Source: Ambulatory Visit | Attending: Cardiology | Admitting: Cardiology

## 2020-09-06 DIAGNOSIS — I2510 Atherosclerotic heart disease of native coronary artery without angina pectoris: Secondary | ICD-10-CM | POA: Diagnosis not present

## 2020-09-06 LAB — MYOCARDIAL PERFUSION IMAGING
LV dias vol: 159 mL (ref 62–150)
LV sys vol: 93 mL
Peak HR: 78 {beats}/min
Rest HR: 54 {beats}/min
SDS: 2
SRS: 4
SSS: 6
TID: 1.21

## 2020-09-06 MED ORDER — TECHNETIUM TC 99M TETROFOSMIN IV KIT
29.9000 | PACK | Freq: Once | INTRAVENOUS | Status: AC | PRN
  Administered 2020-09-06: 29.9 via INTRAVENOUS
  Filled 2020-09-06: qty 30

## 2020-09-06 MED ORDER — AMINOPHYLLINE 25 MG/ML IV SOLN
75.0000 mg | Freq: Once | INTRAVENOUS | Status: AC
  Administered 2020-09-06: 75 mg via INTRAVENOUS

## 2020-09-06 MED ORDER — TECHNETIUM TC 99M TETROFOSMIN IV KIT
10.5000 | PACK | Freq: Once | INTRAVENOUS | Status: AC | PRN
  Administered 2020-09-06: 10.5 via INTRAVENOUS
  Filled 2020-09-06: qty 11

## 2020-09-06 MED ORDER — REGADENOSON 0.4 MG/5ML IV SOLN
0.4000 mg | Freq: Once | INTRAVENOUS | Status: AC
  Administered 2020-09-06: 0.4 mg via INTRAVENOUS

## 2020-09-10 ENCOUNTER — Other Ambulatory Visit: Payer: Self-pay

## 2020-09-10 DIAGNOSIS — I2510 Atherosclerotic heart disease of native coronary artery without angina pectoris: Secondary | ICD-10-CM

## 2020-09-10 NOTE — Progress Notes (Signed)
echo

## 2020-09-11 ENCOUNTER — Ambulatory Visit (INDEPENDENT_AMBULATORY_CARE_PROVIDER_SITE_OTHER): Payer: Medicare Other | Admitting: Cardiology

## 2020-09-11 ENCOUNTER — Ambulatory Visit: Payer: Medicare Other | Admitting: Cardiology

## 2020-09-11 ENCOUNTER — Other Ambulatory Visit: Payer: Self-pay

## 2020-09-11 ENCOUNTER — Encounter: Payer: Self-pay | Admitting: Cardiology

## 2020-09-11 VITALS — BP 144/84 | HR 56 | Ht 68.0 in | Wt 204.0 lb

## 2020-09-11 DIAGNOSIS — D693 Immune thrombocytopenic purpura: Secondary | ICD-10-CM

## 2020-09-11 DIAGNOSIS — Z8679 Personal history of other diseases of the circulatory system: Secondary | ICD-10-CM

## 2020-09-11 DIAGNOSIS — I25118 Atherosclerotic heart disease of native coronary artery with other forms of angina pectoris: Secondary | ICD-10-CM | POA: Diagnosis not present

## 2020-09-11 DIAGNOSIS — Z952 Presence of prosthetic heart valve: Secondary | ICD-10-CM

## 2020-09-11 DIAGNOSIS — I2510 Atherosclerotic heart disease of native coronary artery without angina pectoris: Secondary | ICD-10-CM

## 2020-09-11 DIAGNOSIS — I723 Aneurysm of iliac artery: Secondary | ICD-10-CM

## 2020-09-11 DIAGNOSIS — R002 Palpitations: Secondary | ICD-10-CM | POA: Diagnosis not present

## 2020-09-11 DIAGNOSIS — I712 Thoracic aortic aneurysm, without rupture, unspecified: Secondary | ICD-10-CM

## 2020-09-11 DIAGNOSIS — I10 Essential (primary) hypertension: Secondary | ICD-10-CM | POA: Diagnosis not present

## 2020-09-11 DIAGNOSIS — Z9889 Other specified postprocedural states: Secondary | ICD-10-CM | POA: Diagnosis not present

## 2020-09-11 NOTE — Progress Notes (Signed)
Cardiology Office Note    Date:  09/11/2020   ID:  Lonnie Hansen, DOB 05/08/1950, MRN [id number]  PCP:  [name], MD  Cardiologist:  Dr. Martinique   Chief Complaint  Patient presents with   Coronary Artery Disease    History of Present Illness:  Lonnie Hansen is a 70 y.o. male is seen to discuss abnormal stress test. 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 In 2017 chest x-ray showed aortic root enlargement. He underwent CT that showed dilatation of proximal aorta. Descending aorta was normal. Echocardiogram obtained on 09/20/2015 showed EF 60-65%, moderate to severe AI, grade 1 DD. He was evaluated by Dr. Servando Snare. CT scan showed aortic root had enlarged to 5.2 cm. Given the size of aortic root and the degree of AI, it was recommended he undergo aortic root grafting and AVR. He underwent preoperative evaluation with cardiac catheterization on 04/14/2016 which showed moderate to severe AI, EF 45%, 80% stenosis in a nondominant RCA, 50% distal LAD. He eventually underwent the planned procedure with ascending aortic root replacement using 32 mm Gelweave Valsalva Graft and AVR using 29 mm Edwards Perimount Magna Ease aortic bioprosthesis valve on 09/15/2016 by Dr. Servando Snare.   When seen in follow up chest x-ray showed recurrent right pleural effusion.Echo on 10/09/2016 showed only a trivial amount of pericardial effusion, EF 60-65%. He underwent right thoracentesis again on 10/23/2016 with the removal 2 L of dark blood. Post procedure, he developed a right pneumothorax related to incomplete reexpansion of the right middle and lower lobe of the lung. He was admitted on 10/30/2016 for pigtail drain placement,  chest x-ray obtained on 11/04/2016 showed improvement of right pleural effusion and resolution of right pneumothorax. Followed by Dr. Servando Snare - Repeat CT chest in October 2020 looked good.   Patient was admitted in March  2021 with lower GI bleed. Upper EGD normal. Colonoscopy showed extensive diverticulosis but no active bleeding.   He was seen in April 2021 with symptoms of lightheadedness. Echo was unremarkable. Normal EF and normal AV prosthesis function.   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   He was admitted again in April 2022 with recurrent GI bleed. EGD and sigmoidoscopy done. Apparently has cardiac arrest requiring CPR and Epi with ROSC.  Bleeding scan showed uptake in the rectum. He was transfused. He was readmitted with recurrent bleed in May. Again transfused. Colonoscopy showed blood throughout the colon. 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 He was seen by Dr Oneida Alar. Repair was deferred for nown.   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 BP was high and pulse was low at 43. Atenolol dose was reduced and losartan increased.  A Zio patch monitor was placed. He was concerned that he had coronary blockage so a Myoview study was ordered. This demonstrated ischemia in the inferior and apical region with EF 42%. Most recent platelet count was 99K. He currently denies any chest pain or dyspnea. Notes some memory loss. No further bleeding.     Past Medical History:  Diagnosis Date   AAA (abdominal aortic aneurysm) (HCC)    Anemia    Arthritis    Asymptomatic bilateral carotid artery stenosis 08/2015   1-39%    Cataracts, bilateral    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  Coronary artery disease    Diverticulosis    Enlarged aorta (HCC)    Enlarged prostate    slightly   GERD (gastroesophageal reflux disease)    takes Pantoprazole daily as needed   Glaucoma    uses eye drops daily   Headache    History of colon polyps    benign   History of kidney stones    Hyperlipidemia    no on any meds   Hypertension    takes Amlodipine and Atenolol daily   OSA on CPAP    Vocal cord nodule    pt. states   it's a" growth on vocal cord"    Past Surgical History:  Procedure Laterality Date   AORTIC ARCH ANGIOGRAPHY N/A 04/16/2016   Procedure: Aortic Arch Angiography;  Surgeon: Kelly Ranieri M Martinique, MD;  Location: [place] [facility];  Service: Cardiovascular;  Laterality: N/A;   AORTIC VALVE REPLACEMENT N/A 09/15/2016   Procedure: AORTIC VALVE REPLACEMENT (AVR);  Surgeon: Grace Isaac, MD;  Location: [place];  Service: Open Heart Surgery;  Laterality: N/A;  Using 86mm Edwards Perimount Magna Ease Aortic Bioprosthesis Valve   ASCENDING AORTIC ROOT REPLACEMENT N/A 09/15/2016   Procedure: ASCENDING AORTIC ROOT REPLACEMENT;  Surgeon: Grace Isaac, MD;  Location: [place];  Service: Open Heart Surgery;  Laterality: N/A;  Using 80mm Gelweave Valsalva Graft   COLONOSCOPY     COLONOSCOPY Left 04/16/2019   Procedure: COLONOSCOPY;  Surgeon: Arta Silence, MD;  [facility];  Service: Endoscopy;  Laterality: Left;   COLONOSCOPY N/A 06/01/2020   Procedure: COLONOSCOPY;  Surgeon: Ronnette Juniper, MD;  Location: [place];  Service: Gastroenterology;  Laterality: N/A;   COLONOSCOPY WITH ESOPHAGOGASTRODUODENOSCOPY (EGD)     ESOPHAGOGASTRODUODENOSCOPY N/A 05/25/2020   Procedure: ESOPHAGOGASTRODUODENOSCOPY (EGD);  Surgeon: Wilford Corner, MD;  Location: [place];  Service: Endoscopy;  Laterality: N/A;   ESOPHAGOGASTRODUODENOSCOPY (EGD) WITH PROPOFOL Left 04/16/2019   Procedure: ESOPHAGOGASTRODUODENOSCOPY (EGD) WITH PROPOFOL;  Surgeon: Arta Silence, MD;  Location: [facility] ENDOSCOPY;  Service: Endoscopy;  Laterality: Left;   FLEXIBLE SIGMOIDOSCOPY N/A 05/25/2020   Procedure: FLEXIBLE SIGMOIDOSCOPY;  Surgeon: Wilford Corner, MD;  Location: [place];  Service: Endoscopy;  Laterality: N/A;   IR THORACENTESIS ASP PLEURAL SPACE W/IMG GUIDE  10/23/2016   MICROLARYNGOSCOPY Right 09/20/2017   Procedure: MICROLARYNGOSCOPY WITH  EXCISION OF VOCAL CORD LESION;  Surgeon: Leta Baptist, MD;  Location: [place];  Service: ENT;  Laterality: Right;   MULTIPLE EXTRACTIONS WITH ALVEOLOPLASTY N/A 06/10/2016   Procedure: Extraction of tooth #'s 2,8,13,15, and 29  with alveoloplasty, maxillary right and left buccal exostoses reductions, and gross debridement of remaining teeth.;  Surgeon: Lenn Cal, DDS;  Location: [place];  Service: Oral Surgery;  Laterality: N/A;   RIGHT/LEFT HEART CATH AND CORONARY ANGIOGRAPHY N/A 04/16/2016   Procedure: Right/Left Heart Cath and Coronary Angiography;  Surgeon: Reed Dady M Martinique, MD;  Location: [place] [facility];  Service: Cardiovascular;  Laterality: N/A;   TEE WITHOUT CARDIOVERSION N/A 09/15/2016   Procedure: TRANSESOPHAGEAL ECHOCARDIOGRAM (TEE);  Surgeon: Grace Isaac, MD;  Location: [place];  Service: Open Heart Surgery;  Laterality: N/A;    Current Medications: Outpatient Medications Prior to Visit  Medication Sig Dispense Refill   acetaminophen (TYLENOL) 325 MG tablet Take 2 tablets (650 mg total) by mouth every 6 (six) hours as needed for mild pain or fever.     amLODipine (NORVASC) 10 MG tablet Take 10 mg by mouth daily at 12 noon.  atenolol (TENORMIN) 25 MG tablet Take 1 tablet (25 mg total) by mouth 2 (two) times daily. 180 tablet 3   Ensure Plus (ENSURE PLUS) LIQD Take 237 mLs by mouth daily.     ferrous sulfate 325 (65 FE) MG tablet Take 325 mg by mouth daily at 12 noon.     latanoprost (XALATAN) 0.005 % ophthalmic solution Place 1 drop into both eyes 2 (two) times daily.     losartan-hydrochlorothiazide (HYZAAR) 100-12.5 MG tablet Take 1 tablet by mouth daily.     montelukast (SINGULAIR) 10 MG tablet Take 10 mg by mouth daily.     Multiple Vitamin (MULTIVITAMIN WITH MINERALS) TABS tablet Take 1 tablet by mouth daily at 12 noon.     pantoprazole (PROTONIX) 20 MG tablet Take 20 mg by mouth 2 (two) times daily.     polyethylene glycol (MIRALAX / GLYCOLAX) 17 g packet Take 17 g by mouth daily. 14 each 0   psyllium (HYDROCIL/METAMUCIL) 95 % PACK  Take 1 packet by mouth daily. 240 each 3   rosuvastatin (CRESTOR) 10 MG tablet Take 1 tablet by mouth once daily (Patient taking differently: Take 10 mg by mouth daily at 12 noon.) 90 tablet 1   triamcinolone cream (KENALOG) 0.1 % Apply 1 application topically 2 (two) times daily as needed (facial itching/rash).     fluticasone (FLONASE) 50 MCG/ACT nasal spray Place 1 spray into both nostrils daily as needed for allergies or rhinitis.     montelukast (SINGULAIR) 10 MG tablet Take 10 mg by mouth at bedtime.     No facility-administered medications prior to visit.     Allergies:   Patient has no known allergies.   Social History   Socioeconomic History   Marital status: Married    Spouse name: Not on file   Number of children: 1   Years of education: Not on file   Highest education level: Not on file  Occupational History   Occupation: Musician  Tobacco Use   Smoking status: Former    Packs/day: 1.00    Years: 25.00    Pack years: 25.00    Types: Cigarettes   Smokeless tobacco: Never  Vaping Use   Vaping Use: Never used  Substance and Sexual Activity   Alcohol use: No    Alcohol/week: 0.0 standard drinks   Drug use: No   Sexual activity: Not on file  Other Topics Concern   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   Social Determinants of Health   Financial Resource Strain: Not on file  Food Insecurity: No Food Insecurity   Worried About Charity fundraiser in the Last Year: Never true   Ran Out of Food in the Last Year: Never true  Transportation Needs: No Transportation Needs   Lack of Transportation (Medical): No   Lack of Transportation (Non-Medical): No  Physical Activity: Not on file  Stress: Not on file  Social Connections: Not on file     Family History:  The patient's family history includes Heart attack in his father; Heart disease in his father; Thyroid disease in his sister.   ROS:   Please see the  history of present illness.    ROS All other systems reviewed and are negative.   PHYSICAL EXAM:   VS:  BP (!) 144/84   Pulse (!) 56   Ht 5\' 8"  (1.727 m)   Wt 204  lb (92.5 kg)   SpO2 97%   BMI 31.02 kg/m    GENERAL:  Well appearing, obese BM in NAD. Walks with a cane. HEENT:  PERRL, EOMI, sclera are clear. Oropharynx is clear. NECK:  No jugular venous distention, carotid upstroke brisk and symmetric, no bruits, no thyromegaly or adenopathy LUNGS:  Clear to auscultation bilaterally CHEST:  Unremarkable HEART:  RRR,  PMI not displaced or sustained,S1 and S2 within normal limits, no S3, no S4: no clicks, no rubs, gr 0-2/5 SEM ABD:  Soft, nontender. BS +, no masses or bruits. No hepatomegaly, no splenomegaly EXT:  2 + pulses throughout, no edema, no cyanosis no clubbing SKIN:  Warm and dry.  No rashes NEURO:  Alert and oriented x 3. Cranial nerves II through XII intact. PSYCH:  Cognitively intact   Wt Readings from Last 3 Encounters:  09/11/20 204 lb (92.5 kg)  09/06/20 209 lb (94.8 kg)  08/26/20 209 lb (94.8 kg)      Studies/Labs Reviewed:   EKG:  EKG is not ordered today.     Recent Labs: 05/26/2020: Magnesium 2.0 08/26/2020: ALT 9; BUN 16; Creatinine 1.00; Hemoglobin 12.7; Platelet Count 99; Potassium 3.6; Sodium 138   Lipid Panel    Component Value Date/Time   CHOL 157 05/11/2019 0946   TRIG 201 (H) 05/26/2020 0439   HDL 32 (L) 05/11/2019 0946   CHOLHDL 4.9 05/11/2019 0946   CHOLHDL 3.5 04/28/2010 0703   VLDL 14 04/28/2010 0703   LDLCALC 81 05/11/2019 0946   Dated 02/21/20: creatinine 1.38. potassium 3.5. Hgb 11.3.   Additional studies/ records that were reviewed today include:   Echo 09/20/2015 LV EF: 60% -   65%   Study Conclusions   - Left ventricle: The cavity size was normal. There was moderate   focal basal hypertrophy of the septum. Systolic function was   normal. The estimated ejection fraction was in the range of 60%   to 65%. Wall motion was  normal; there were no regional wall   motion abnormalities. Doppler parameters are consistent with   abnormal left ventricular relaxation (grade 1 diastolic   dysfunction). - Aortic valve: There was moderate to severe regurgitation. - Aorta: Aortic root dimension: 43 mm (ED). - Ascending aorta: The ascending aorta was mildly dilated. - Right ventricle: The cavity size was mildly dilated. Wall   thickness was normal.       Cath 04/16/2016 Conclusion      Mid RCA lesion, 80 %stenosed. Ost LAD to Prox LAD lesion, 25 %stenosed. Dist LAD lesion, 50 %stenosed. There is mild left ventricular systolic dysfunction. LV end diastolic pressure is normal. The left ventricular ejection fraction is 45-50% by visual estimate. There is no mitral valve regurgitation. There is no aortic valve stenosis. LV end diastolic pressure is normal.   1. 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 Mild LV dysfunction. EF 45%. 3. Ascending thoracic aortic aneurysm. 4. 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 5. Normal Right heart  And LV filling pressures 6. Normal cardiac output.    Plan: Aortic root grafting and AVR.         Echo 09/16/2016 LV EF: 55% -   60%   Study Conclusions   - Left ventricle: The cavity size was normal. Wall thickness was   increased in a pattern of severe LVH. Systolic function was   normal. The estimated ejection fraction was in the range of 55%   to 60%. - Aortic valve: Valve area (  VTI): 4.29 cm^2. Valve area (Vmax):   4.15 cm^2. Valve area (Vmean): 3.88 cm^2. - Mitral valve: Severely calcified annulus. Valve area by   continuity equation (using LVOT flow): 3.35 cm^2. - Pericardium, extracardiac: Localized moderate pericardial   effusion posterior to the LA.    Echo 05/19/19: IMPRESSIONS     1. Left ventricular ejection fraction, by estimation, is 55 to 60%. The  left ventricle has normal function. The left ventricle has no regional  wall motion  abnormalities. There is mild left ventricular hypertrophy of  the basal-septal segment. Left  ventricular diastolic parameters were normal.   2. Right ventricular systolic function is normal. The right ventricular  size is normal. There is normal pulmonary artery systolic pressure. The  estimated right ventricular systolic pressure is 67.3 mmHg.   3. Left atrial size was mildly dilated.   4. The mitral valve is normal in structure. Mild to moderate mitral valve  regurgitation. No evidence of mitral stenosis.   5. The aortic valve has been repaired/replaced. Aortic valve  regurgitation is not visualized. No aortic stenosis is present. 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 Procedure  Date: 2018. Echo findings are consistent  with normal structure and function of the aortic valve prosthesis. Aortic  valve mean gradient measures 10.8 mmHg. Aortic valve Vmax measures 2.12  m/s.   6. Aortic root/ascending aorta has been repaired/replaced.   7. The inferior vena cava is normal in size with greater than 50%  respiratory variability, suggesting right atrial pressure of 3 mmHg.   Comparison(s): Prior images unable to be directly viewed, comparison made  by report only.    CT ANGIOGRAPHY CHEST WITH CONTRAST   TECHNIQUE: Multidetector CT imaging of the chest was performed using the standard protocol during bolus administration of intravenous contrast. Multiplanar CT image reconstructions and MIPs were obtained to evaluate the vascular anatomy.   CONTRAST:  37mL ISOVUE-370 IOPAMIDOL (ISOVUE-370) INJECTION 76%   Creatinine was obtained on site at [facility] at [address]   Results: Creatinine 1.1 mg/dL.   COMPARISON:  11/17/2018   FINDINGS: Cardiovascular: Previous median sternotomy and aortic valve repair. The heart size appears normal. Mild aortic atherosclerosis.   The aortic root measures 4.2 cm in diameter, image 86/10.  Unchanged from previous exam.   The sino-tubular junction measures 3 cm in diameter, image 79/10. Unchanged.   The ascending thoracic aorta measures 3.9 cm, image 80/10. Unchanged from previous exam.   The transverse aortic arch measures 4.2 cm, image 131/12. Unchanged from previous exam   The descending thoracic aorta measures 2.9 cm, image 131/10. Stable.   No signs of aortic dissection. The great vessels appear widely patent.   The main pulmonary artery is patent. No obstructing pulmonary embolus identified.   Mediastinum/Nodes: Normal appearance of the thyroid gland. The trachea appears patent and is midline. Normal appearance of the esophagus. No supraclavicular, axillary, mediastinal, or hilar lymph nodes.   Lungs/Pleura: No pleural effusion. No airspace consolidation. Posterolateral left upper lobe scar is unchanged.   Upper Abdomen: No acute abnormality. Gallstones. Left upper pole renal calcification measures 1.1 cm.   Musculoskeletal: No acute findings.   Review of the MIP images confirms the above findings.   IMPRESSION: 1. 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 Recommend semi-annual imaging followup by CTA or MRA and referral to cardiothoracic surgery if not already obtained. This recommendation follows 2010 ACCF/AHA/AATS/ACR/ASA/SCA/SCAI/SIR/STS/SVM Guidelines  for the Diagnosis and Management of Patients With Thoracic Aortic Disease. Circulation. 2010; 121: V371-G62. Aortic aneurysm NOS (ICD10-I71.9) 2. Status post aortic valve repair. 3. Gallstones. 4. Nonobstructing left renal calculus. 5. Aortic atherosclerosis.   Aortic Atherosclerosis (ICD10-I70.0).     Electronically Signed   By: Kerby Moors M.D.   On: 11/30/2019 10:02   Myoview 09/06/20: Study Highlights    The left ventricular ejection fraction is moderately decreased (30-44%). Nuclear stress EF: 42%. There was no ST segment  deviation noted during stress. Defect 1: There is a medium defect of moderate severity present in the basal inferior, mid inferior and apex location. Defect 2: There is a small defect of moderate severity present in the apical inferior and apical lateral location. Findings consistent with ischemia. This is an intermediate risk study.   Abnormal study. TID is 1.21, cannot exclude balanced ischemia. There is a fixed defect in the inferior wall and apex that is moderately severe, with mild-moderate hypokinesis in this area. There is a separate small reversible defect of moderate severity in the apical and inferior regions suggestive of ischemia. EF is moderately decreased, though would consider echo for definitive evaluation of function and wall motion.   ASSESSMENT:    1. Coronary artery disease of native artery of native heart with stable angina pectoris ([place])   2. S/P AVR (aortic valve replacement)   3. Essential hypertension   4. Chronic ITP (idiopathic thrombocytopenia) (HCC)   5. Iliac artery aneurysm, bilateral ([place])   6. Thoracic aortic aneurysm without rupture ([place])   7. S/P aortic aneurysm repair       PLAN:  In order of problems listed above:  H/o Aortic root repair and AVR with a tissue valve.:   Repeat Echo in March 2021 showed good prosthetic valve function. Repeat CT in November showed stable 4.3 cm aneurysm.  Will repeat Echo tomorrow.   Recurrent right pleural effusion- resolved.   CAD: asymptomatic.  80% mid nondominant RCA and a 50% distal LAD lesion on previous cardiac catheterization in 2018.  Abnormal myoview with some inferior and apical ischemia. This was an intermediate risk study. Ordinarily would consider repeat coronary angiography but his bleeding risk is high with ITP and recurrent major lower GI bleeds. He is a poor candidate for PCI because of this. I would not recommend invasive evaluation unless he were to have progressive angina despite medical therapy.    Hypertension: Blood pressure is under good control.  Hyperlipidemia:   6.   ITP followed by Dr [name].   7.   Bradycardia. Improved today on lower dose of atenolol. Will await results of event monitor.    Medication Adjustments/Labs and Tests Ordered: Current medicines are reviewed at length with the patient today.  Concerns regarding medicines are outlined above.  Medication changes, Labs and Tests ordered today are listed in the Patient Instructions below. There are no Patient Instructions on file for this visit.    Signed, Zaylan Kissoon Martinique, MD  09/11/2020 2:56 PM    [facility] [place], [place], [place]  [address] Phone: [phone]; Fax: [phone]

## 2020-09-12 ENCOUNTER — Ambulatory Visit (HOSPITAL_COMMUNITY): Payer: Medicare Other | Attending: Cardiology

## 2020-09-12 ENCOUNTER — Encounter (HOSPITAL_COMMUNITY): Payer: Self-pay

## 2020-09-16 ENCOUNTER — Other Ambulatory Visit: Payer: Self-pay

## 2020-09-16 ENCOUNTER — Ambulatory Visit (HOSPITAL_COMMUNITY): Payer: Medicare Other | Attending: Cardiology

## 2020-09-16 ENCOUNTER — Other Ambulatory Visit: Payer: Self-pay | Admitting: *Deleted

## 2020-09-16 DIAGNOSIS — I2510 Atherosclerotic heart disease of native coronary artery without angina pectoris: Secondary | ICD-10-CM | POA: Diagnosis not present

## 2020-09-16 DIAGNOSIS — Z8719 Personal history of other diseases of the digestive system: Secondary | ICD-10-CM | POA: Diagnosis not present

## 2020-09-16 DIAGNOSIS — K2100 Gastro-esophageal reflux disease with esophagitis, without bleeding: Secondary | ICD-10-CM | POA: Diagnosis not present

## 2020-09-16 DIAGNOSIS — D693 Immune thrombocytopenic purpura: Secondary | ICD-10-CM | POA: Diagnosis not present

## 2020-09-16 DIAGNOSIS — D500 Iron deficiency anemia secondary to blood loss (chronic): Secondary | ICD-10-CM | POA: Diagnosis not present

## 2020-09-16 DIAGNOSIS — K5900 Constipation, unspecified: Secondary | ICD-10-CM | POA: Diagnosis not present

## 2020-09-16 LAB — ECHOCARDIOGRAM COMPLETE
AR max vel: 1.66 cm2
AV Area VTI: 1.86 cm2
AV Area mean vel: 1.8 cm2
AV Mean grad: 11 mmHg
AV Peak grad: 22.8 mmHg
Ao pk vel: 2.39 m/s
Area-P 1/2: 3.23 cm2
S' Lateral: 4 cm

## 2020-09-16 NOTE — Patient Outreach (Signed)
[facility]) Care Management  09/16/2020  BEREL NAJJAR Nov 30, 1950 [id number]   Outgoing call placed to member, no answer, HIPAA compliant voice message left.  Will follow up within the next 3-4 business days.  [name], [place], MSN [place] [phone]

## 2020-09-19 ENCOUNTER — Other Ambulatory Visit: Payer: Self-pay | Admitting: *Deleted

## 2020-09-19 ENCOUNTER — Telehealth: Payer: Self-pay

## 2020-09-19 DIAGNOSIS — G4733 Obstructive sleep apnea (adult) (pediatric): Secondary | ICD-10-CM | POA: Diagnosis not present

## 2020-09-19 DIAGNOSIS — I700 Atherosclerosis of aorta: Secondary | ICD-10-CM | POA: Diagnosis not present

## 2020-09-19 DIAGNOSIS — D696 Thrombocytopenia, unspecified: Secondary | ICD-10-CM | POA: Diagnosis not present

## 2020-09-19 DIAGNOSIS — I2510 Atherosclerotic heart disease of native coronary artery without angina pectoris: Secondary | ICD-10-CM | POA: Diagnosis not present

## 2020-09-19 DIAGNOSIS — E785 Hyperlipidemia, unspecified: Secondary | ICD-10-CM | POA: Diagnosis not present

## 2020-09-19 DIAGNOSIS — M25551 Pain in right hip: Secondary | ICD-10-CM | POA: Diagnosis not present

## 2020-09-19 DIAGNOSIS — I10 Essential (primary) hypertension: Secondary | ICD-10-CM | POA: Diagnosis not present

## 2020-09-19 NOTE — Patient Outreach (Signed)
[facility]) Care Management  [place]  09/21/2020   Lonnie Hansen [birth year] [id number]   Outreach attempt #2, successful.  Member report doing well with the exception of skin rash.  State he was told that it is insect bites by dermatology.  He is curious to know where they care from as not other family in the home has the rash.  Will follow up with PCP today.  Denies any urgent concerns, encouraged to contact this care manager with questions.    Encounter Medications:  Outpatient Encounter Medications as of 09/19/2020  Medication Sig   acetaminophen (TYLENOL) 325 MG tablet Take 2 tablets (650 mg total) by mouth every 6 (six) hours as needed for mild pain or fever.   amLODipine (NORVASC) 10 MG tablet Take 10 mg by mouth daily at 12 noon.   atenolol (TENORMIN) 25 MG tablet Take 1 tablet (25 mg total) by mouth 2 (two) times daily.   Ensure Plus (ENSURE PLUS) LIQD Take 237 mLs by mouth daily.   ferrous sulfate 325 (65 FE) MG tablet Take 325 mg by mouth daily at 12 noon.   latanoprost (XALATAN) 0.005 % ophthalmic solution Place 1 drop into both eyes 2 (two) times daily.   losartan-hydrochlorothiazide (HYZAAR) 100-12.5 MG tablet Take 1 tablet by mouth daily.   montelukast (SINGULAIR) 10 MG tablet Take 10 mg by mouth daily.   Multiple Vitamin (MULTIVITAMIN WITH MINERALS) TABS tablet Take 1 tablet by mouth daily at 12 noon.   pantoprazole (PROTONIX) 20 MG tablet Take 20 mg by mouth 2 (two) times daily.   polyethylene glycol (MIRALAX / GLYCOLAX) 17 g packet Take 17 g by mouth daily.   psyllium (HYDROCIL/METAMUCIL) 95 % PACK Take 1 packet by mouth daily.   rosuvastatin (CRESTOR) 10 MG tablet Take 1 tablet by mouth once daily (Patient taking differently: Take 10 mg by mouth daily at 12 noon.)   triamcinolone cream (KENALOG) 0.1 % Apply 1 application topically 2 (two) times daily as needed (facial itching/rash).   No facility-administered encounter medications on file  as of 09/19/2020.    Functional Status:  In your present state of health, do you have any difficulty performing the following activities: 05/31/2020 05/31/2020  Hearing? N -  Vision? N -  Difficulty concentrating or making decisions? N -  Walking or climbing stairs? N -  Dressing or bathing? N -  Doing errands, shopping? - N  Some recent data might be hidden    Fall/Depression Screening: Fall Risk  05/30/2020 05/31/2017 12/03/2016  Falls in the past year? 0 No No  Number falls in past yr: 0 - -  Injury with Fall? 0 - -   PHQ 2/9 Scores 05/30/2020 03/15/2017 12/07/2016  PHQ - 2 Score 0 0 0    Assessment:   Care Plan Care Plan : General Plan of Care (Adult)  Updates made by [name], RN since 09/21/2020 12:00 AM     Problem: Knowledge deficit related to management of chronic medical conditions (ITP and HTN)   Priority: High     Long-Range Goal: Patient will verbalize and display chronic health management evidenced by no hospital admissions in the next 6 months   Start Date: 09/19/2020  Expected End Date: 03/20/2021  Priority: High  Note:   Current Barriers:  Knowledge Deficits related to plan of care for management of HTN and ITP  Chronic Disease Management support and education needs related to HTN and ITP  RNCM Clinical Goal(s):  Patient  will verbalize understanding of plan for management of HTN and ITP take all medications exactly as prescribed and will call provider for medication related questions attend all scheduled medical appointments: PCP, Cardiology, and Hematology continue to work with RN Care Manager to address care management and care coordination needs related to HTN and ITP  through collaboration with RN Care manager, provider, and care team.   Interventions: Inter-dis1:1 collaboration with primary care provider regarding development and update of comprehensive plan of care as evidenced by provider attestation and co-signatureciplinary care team collaboration (see  longitudinal plan of care) Evaluation of current treatment plan related to  self management and patient's adherence to plan as established by provider   Hypertension: (Status: New goal.) Last practice recorded BP readings:  BP Readings from Last 3 Encounters:  09/11/20 (!) 144/84  08/26/20 (!) 171/74  08/05/20 (!) 156/88  Most recent eGFR/CrCl: No results found for: EGFR  No components found for: CRCL  Evaluation of current treatment plan related to hypertension self management and patient's adherence to plan as established by provider;   Reviewed prescribed diet low salt Advised patient, providing education and rationale, to monitor blood pressure daily and record, calling PCP for findings outside established parameters;  Discussed complications of poorly controlled blood pressure such as heart disease, stroke, circulatory complications, vision complications, kidney impairment, sexual dysfunction;  Screening for signs and symptoms of depression related to chronic disease state;   Patient Goals/Self-Care Activities: Patient will attend all scheduled provider appointments Patient will call pharmacy for medication refills Patient will call provider office for new concerns or questions        Goals Addressed             This Visit's Progress    [facility] - [facility]ain My Quality of Life   On track    Timeframe:  Long-Range Goal Priority:  Medium Start Date:     5/5                        Expected End Date:   11/5               Barriers: Knowledge    - complete a living will - discuss my treatment options with the doctor or nurse    Why is this important?   Having a long-term illness can be scary.  It can also be stressful for you and your caregiver.  These steps may help.    Notes:   5/5 - Educated on signs of bleeding.  Encouraged to monitor blood pressure daily and record readings.  Blood pressure monitor sent.  5/11 - Discussed recent discharge instructions.  Call  placed to PCP office to follow up on member's question about medications  5/26 - Denies any further bleeding, report blood levels are starting to increase  6/22 - Report he has started Romiplostim injections, platelet count has increased from 50,000 in May to over 100,000 with his recent labs on 6/20  7/21 - Continues follow up with hematology to monitor platelet levels.  Denies bleeding  8/25 - Platelet levels remain normal currently, follow up with hematology on 9/6     [facility] - Track and Manage My Blood Pressure-Hypertension       Timeframe:  Short-Term Goal Priority:  High Start Date:             8/25                Expected End Date:  11/25             Barriers: Health Behaviors Knowledge       Follow Up Date 10/20/2020    - check blood pressure daily - write blood pressure results in a log or diary    Why is this important?   You won't feel high blood pressure, but it can still hurt your blood vessels.  High blood pressure can cause heart or kidney problems. It can also cause a stroke.  Making lifestyle changes like losing a little weight or eating less salt will help.  Checking your blood pressure at home and at different times of the day can help to control blood pressure.  If the doctor prescribes medicine remember to take it the way the doctor ordered.  Call the office if you cannot afford the medicine or if there are questions about it.     Notes:   8/25 - Educated on importance of monitoring and managing blood pressure.  Confirmed he has blood pressure monitor and sister who is a nurse also helps to monitor with manual cuff.  Recently had Echo, stress test and ECG completed, released to continue workouts at [facility].        Plan:  Follow-up: Patient agrees to Care Plan and Follow-up. Follow-up in 1 month(s).  [name], [place], MSN [place] [phone]

## 2020-09-19 NOTE — Telephone Encounter (Signed)
Spoke to patient Dr.Jordan advised ok to lift light weights and ok to exercise at [facility].

## 2020-10-01 ENCOUNTER — Inpatient Hospital Stay: Payer: Medicare Other

## 2020-10-01 ENCOUNTER — Inpatient Hospital Stay (HOSPITAL_BASED_OUTPATIENT_CLINIC_OR_DEPARTMENT_OTHER): Payer: Medicare Other | Admitting: Hematology & Oncology

## 2020-10-01 ENCOUNTER — Other Ambulatory Visit: Payer: Self-pay

## 2020-10-01 ENCOUNTER — Inpatient Hospital Stay: Payer: Medicare Other | Attending: Hematology & Oncology

## 2020-10-01 ENCOUNTER — Encounter: Payer: Self-pay | Admitting: Hematology & Oncology

## 2020-10-01 VITALS — BP 147/87 | HR 63 | Temp 98.9°F | Resp 18 | Wt 203.3 lb

## 2020-10-01 DIAGNOSIS — Z79899 Other long term (current) drug therapy: Secondary | ICD-10-CM | POA: Diagnosis not present

## 2020-10-01 DIAGNOSIS — D693 Immune thrombocytopenic purpura: Secondary | ICD-10-CM | POA: Insufficient documentation

## 2020-10-01 DIAGNOSIS — K921 Melena: Secondary | ICD-10-CM | POA: Diagnosis not present

## 2020-10-01 DIAGNOSIS — M791 Myalgia, unspecified site: Secondary | ICD-10-CM | POA: Insufficient documentation

## 2020-10-01 DIAGNOSIS — M2550 Pain in unspecified joint: Secondary | ICD-10-CM | POA: Diagnosis not present

## 2020-10-01 LAB — CMP (CANCER CENTER ONLY)
ALT: 9 U/L (ref 0–44)
AST: 17 U/L (ref 15–41)
Albumin: 4.6 g/dL (ref 3.5–5.0)
Alkaline Phosphatase: 46 U/L (ref 38–126)
Anion gap: 8 (ref 5–15)
BUN: 14 mg/dL (ref 8–23)
CO2: 28 mmol/L (ref 22–32)
Calcium: 9.8 mg/dL (ref 8.9–10.3)
Chloride: 102 mmol/L (ref 98–111)
Creatinine: 1.07 mg/dL (ref 0.61–1.24)
GFR, Estimated: >60 mL/min (ref >60)
Glucose, Bld: 110 mg/dL — ABNORMAL HIGH (ref 70–99)
Potassium: 4 mmol/L (ref 3.5–5.1)
Sodium: 138 mmol/L (ref 135–145)
Total Bilirubin: 0.7 mg/dL (ref 0.3–1.2)
Total Protein: 7.4 g/dL (ref 6.5–8.1)

## 2020-10-01 LAB — CBC WITH DIFFERENTIAL (CANCER CENTER ONLY)
Abs Immature Granulocytes: 0.03 10*3/uL (ref 0.00–0.07)
Basophils Absolute: 0 10*3/uL (ref 0.0–0.1)
Basophils Relative: 0 %
Eosinophils Absolute: 0.2 10*3/uL (ref 0.0–0.5)
Eosinophils Relative: 3 %
HCT: 38 % — ABNORMAL LOW (ref 39.0–52.0)
Hemoglobin: 12.9 g/dL — ABNORMAL LOW (ref 13.0–17.0)
Immature Granulocytes: 1 %
Lymphocytes Relative: 20 %
Lymphs Abs: 1.2 10*3/uL (ref 0.7–4.0)
MCH: 30.4 pg (ref 26.0–34.0)
MCHC: 33.9 g/dL (ref 30.0–36.0)
MCV: 89.6 fL (ref 80.0–100.0)
Monocytes Absolute: 0.7 10*3/uL (ref 0.1–1.0)
Monocytes Relative: 11 %
Neutro Abs: 4 10*3/uL (ref 1.7–7.7)
Neutrophils Relative %: 65 %
Platelet Count: 83 10*3/uL — ABNORMAL LOW (ref 150–400)
RBC: 4.24 MIL/uL (ref 4.22–5.81)
RDW: 14.2 % (ref 11.5–15.5)
WBC Count: 6.2 10*3/uL (ref 4.0–10.5)
nRBC: 0 % (ref 0.0–0.2)

## 2020-10-01 LAB — LACTATE DEHYDROGENASE: LDH: 200 U/L — ABNORMAL HIGH (ref 98–192)

## 2020-10-01 MED ORDER — ROMIPLOSTIM 125 MCG ~~LOC~~ SOLR
95.0000 ug | Freq: Once | SUBCUTANEOUS | Status: AC
  Administered 2020-10-01: 95 ug via SUBCUTANEOUS
  Filled 2020-10-01: qty 0.19

## 2020-10-01 NOTE — Patient Instructions (Signed)
Romiplostim injection What is this medication? ROMIPLOSTIM (roe mi PLOE stim) helps your body make more platelets. This medicine is used to treat low platelets caused by chronic idiopathic thrombocytopenic purpura (ITP) or a bone marrow syndrome caused by radiation sickness. This medicine may be used for other purposes; ask your health care provider or pharmacist if you have questions. COMMON BRAND NAME(S): Nplate What should I tell my care team before I take this medication? They need to know if you have any of these conditions: blood clots myelodysplastic syndrome an unusual or allergic reaction to romiplostim, mannitol, other medicines, foods, dyes, or preservatives pregnant or trying to get pregnant breast-feeding How should I use this medication? This medicine is injected under the skin. It is given by a health care provider in a hospital or clinic setting. A special MedGuide will be given to you before each treatment. Be sure to read this information carefully each time. Talk to your health care provider about the use of this medicine in children. While it may be prescribed for children as young as newborns for selected conditions, precautions do apply. Overdosage: If you think you have taken too much of this medicine contact a poison control center or emergency room at once. NOTE: This medicine is only for you. Do not share this medicine with others. What if I miss a dose? Keep appointments for follow-up doses. It is important not to miss your dose. Call your health care provider if you are unable to keep an appointment. What may interact with this medication? Interactions are not expected. This list may not describe all possible interactions. Give your health care provider a list of all the medicines, herbs, non-prescription drugs, or dietary supplements you use. Also tell them if you smoke, drink alcohol, or use illegal drugs. Some items may interact with your medicine. What should I  watch for while using this medication? Visit your health care provider for regular checks on your progress. You may need blood work done while you are taking this medicine. Your condition will be monitored carefully while you are receiving this medicine. It is important not to miss any appointments. What side effects may I notice from receiving this medication? Side effects that you should report to your doctor or health care professional as soon as possible: allergic reactions (skin rash, itching or hives; swelling of the face, lips, or tongue) bleeding (bloody or black, tarry stools; red or dark brown urine; spitting up blood or brown material that looks like coffee grounds; red spots on the skin; unusual bruising or bleeding from the eyes, gums, or nose) blood clot (chest pain; shortness of breath; pain, swelling, or warmth in the leg) stroke (changes in vision; confusion; trouble speaking or understanding; severe headaches; sudden numbness or weakness of the face, arm or leg; trouble walking; dizziness; loss of balance or coordination) Side effects that usually do not require medical attention (report to your doctor or health care professional if they continue or are bothersome): diarrhea dizziness headache joint pain muscle pain stomach pain trouble sleeping This list may not describe all possible side effects. Call your doctor for medical advice about side effects. You may report side effects to FDA at 1-800-FDA-1088. Where should I keep my medication? This medicine is given in a hospital or clinic. It will not be stored at home. NOTE: This sheet is a summary. It may not cover all possible information. If you have questions about this medicine, talk to your doctor, pharmacist, or health care provider.    2022 Elsevier/Gold Standard (2019-02-27 10:28:13)  

## 2020-10-01 NOTE — Progress Notes (Signed)
Hematology and Oncology Follow Up Visit  Lonnie Hansen [id number] 09/20/1950 70 y.o. 10/01/2020   Principle Diagnosis:  Chronic ITP  Current Therapy:   Weekly Nplate to keep platelet count over 100,000     Interim History:  [name] is back for follow-up.  Unfortunately, his platelet count is little bit lower.  His platelet count is 83,000.  We will go ahead and give a dose of Nplate today.  He is doing much better.  His heart is doing much better.  He saw his cardiologist.  He has medications adjusted.  He has had no bleeding.  There is been no bruising.  His appetite has been good.  He has had no nausea or vomiting.  He has had no cough or shortness of breath.  There is been no issues with rashes.  Overall, I would say his performance status is ECOG 1.     Medications:  Current Outpatient Medications:    acetaminophen (TYLENOL) 325 MG tablet, Take 2 tablets (650 mg total) by mouth every 6 (six) hours as needed for mild pain or fever., Disp: , Rfl:    amLODipine (NORVASC) 10 MG tablet, Take 10 mg by mouth daily at 12 noon., Disp: , Rfl:    atenolol (TENORMIN) 25 MG tablet, Take 1 tablet (25 mg total) by mouth 2 (two) times daily., Disp: 180 tablet, Rfl: 3   Ensure Plus (ENSURE PLUS) LIQD, Take 237 mLs by mouth daily., Disp: , Rfl:    ferrous sulfate 325 (65 FE) MG tablet, Take 325 mg by mouth daily at 12 noon., Disp: , Rfl:    latanoprost (XALATAN) 0.005 % ophthalmic solution, Place 1 drop into both eyes 2 (two) times daily., Disp: , Rfl:    losartan-hydrochlorothiazide (HYZAAR) 100-12.5 MG tablet, Take 1 tablet by mouth daily., Disp: , Rfl:    montelukast (SINGULAIR) 10 MG tablet, Take 10 mg by mouth daily., Disp: , Rfl:    Multiple Vitamin (MULTIVITAMIN WITH MINERALS) TABS tablet, Take 1 tablet by mouth daily at 12 noon., Disp: , Rfl:    pantoprazole (PROTONIX) 20 MG tablet, Take 20 mg by mouth 2 (two) times daily., Disp: , Rfl:    polyethylene glycol (MIRALAX / GLYCOLAX) 17 g  packet, Take 17 g by mouth daily., Disp: 14 each, Rfl: 0   psyllium (HYDROCIL/METAMUCIL) 95 % PACK, Take 1 packet by mouth daily., Disp: 240 each, Rfl: 3   rosuvastatin (CRESTOR) 10 MG tablet, Take 1 tablet by mouth once daily (Patient taking differently: Take 10 mg by mouth daily at 12 noon.), Disp: 90 tablet, Rfl: 1   triamcinolone cream (KENALOG) 0.1 %, Apply 1 application topically 2 (two) times daily as needed (facial itching/rash)., Disp: , Rfl:   Allergies: No Known Allergies  Past Medical History, Surgical history, Social history, and Family History were reviewed and updated.  Review of Systems: Review of Systems  Constitutional: Negative.   HENT:  Negative.    Eyes: Negative.   Respiratory: Negative.    Cardiovascular: Negative.   Gastrointestinal:  Positive for blood in stool.  Endocrine: Negative.   Genitourinary: Negative.    Musculoskeletal:  Positive for arthralgias and myalgias.  Hematological: Negative.   Psychiatric/Behavioral: Negative.     Physical Exam:  weight is 203 lb 5 oz (92.2 kg). His oral temperature is 98.9 F (37.2 C). His blood pressure is 147/87 (abnormal) and his pulse is 63. His respiration is 18 and oxygen saturation is 100%.   Wt Readings from Last 3  Encounters:  10/01/20 203 lb 5 oz (92.2 kg)  09/11/20 204 lb (92.5 kg)  09/06/20 209 lb (94.8 kg)    Physical Exam Vitals reviewed.  HENT:     Head: Normocephalic and atraumatic.  Eyes:     Pupils: Pupils are equal, round, and reactive to light.  Cardiovascular:     Rate and Rhythm: Normal rate and regular rhythm.     Heart sounds: Normal heart sounds.  Pulmonary:     Effort: Pulmonary effort is normal.     Breath sounds: Normal breath sounds.  Abdominal:     General: Bowel sounds are normal.     Palpations: Abdomen is soft.  Musculoskeletal:        General: No tenderness or deformity. Normal range of motion.     Cervical back: Normal range of motion.  Lymphadenopathy:     Cervical:  No cervical adenopathy.  Skin:    General: Skin is warm and dry.     Findings: No erythema or rash.  Neurological:     Mental Status: He is alert and oriented to person, place, and time.  Psychiatric:        Behavior: Behavior normal.        Thought Content: Thought content normal.        Judgment: Judgment normal.     Lab Results  Component Value Date   WBC 6.2 10/01/2020   HGB 12.9 (L) 10/01/2020   HCT 38.0 (L) 10/01/2020   MCV 89.6 10/01/2020   PLT 83 (L) 10/01/2020     Chemistry      Component Value Date/Time   NA 138 10/01/2020 1206   NA 140 05/11/2019 0946   K 4.0 10/01/2020 1206   CL 102 10/01/2020 1206   CO2 28 10/01/2020 1206   BUN 14 10/01/2020 1206   BUN 17 05/11/2019 0946   CREATININE 1.07 10/01/2020 1206   CREATININE 0.75 04/10/2016 1132      Component Value Date/Time   CALCIUM 9.8 10/01/2020 1206   ALKPHOS 46 10/01/2020 1206   AST 17 10/01/2020 1206   ALT 9 10/01/2020 1206   BILITOT 0.7 10/01/2020 1206     Impression and Plan:  [name] is a very nice 70 year old African-American male.  He has chronic ITP.  Again, I am going to give him a dose of  Nplate.  I think we can try to get him back in 2 months now.  I am just happy that his quality of life is doing so well right now.   Volanda Napoleon, MD 9/6/202212:55 PM

## 2020-10-14 ENCOUNTER — Other Ambulatory Visit: Payer: Self-pay | Admitting: *Deleted

## 2020-10-14 NOTE — Patient Outreach (Addendum)
[facility]) Care Management  10/14/2020  Lonnie Hansen 1950-09-25 [id number]   Outgoing call placed to member, no answer, HIPAA compliant voice message left.  Will follow up within the next 3-4 business days.   Update:  Voice message received back from member.  Call placed, state he is doing well.  Denies any urgent concerns, encouraged to contact this care manager with questions.  Agrees to follow up within the next 2 months.   Goals Addressed             This Visit's Progress    [facility] - [facility]ain My Quality of Life   On track    Timeframe:  Long-Range Goal Priority:  Medium Start Date:     5/5                        Expected End Date:   11/5               Barriers: Knowledge    - complete a living will - discuss my treatment options with the doctor or nurse    Why is this important?   Having a long-term illness can be scary.  It can also be stressful for you and your caregiver.  These steps may help.    Notes:   5/5 - Educated on signs of bleeding.  Encouraged to monitor blood pressure daily and record readings.  Blood pressure monitor sent.  5/11 - Discussed recent discharge instructions.  Call placed to PCP office to follow up on member's question about medications  5/26 - Denies any further bleeding, report blood levels are starting to increase  6/22 - Report he has started Romiplostim injections, platelet count has increased from 50,000 in May to over 100,000 with his recent labs on 6/20  7/21 - Continues follow up with hematology to monitor platelet levels.  Denies bleeding  8/25 - Platelet levels remain normal currently, follow up with hematology on 9/6  9/19 - Appointment with hematology completed on 9/6, platelets decreased but remain stable at 83,000.  Will follow up with provider in November.       [facility] - Track and Manage My Blood Pressure-Hypertension   On track    Timeframe:  Short-Term Goal Priority:  High Start Date:              8/25                Expected End Date:      11/25             Barriers: Health Behaviors Knowledge       Follow Up Date 10/20/2020    - check blood pressure daily - write blood pressure results in a log or diary    Why is this important?   You won't feel high blood pressure, but it can still hurt your blood vessels.  High blood pressure can cause heart or kidney problems. It can also cause a stroke.  Making lifestyle changes like losing a little weight or eating less salt will help.  Checking your blood pressure at home and at different times of the day can help to control blood pressure.  If the doctor prescribes medicine remember to take it the way the doctor ordered.  Call the office if you cannot afford the medicine or if there are questions about it.     Notes:   8/25 - Educated on importance of monitoring and managing  blood pressure.  Confirmed he has blood pressure monitor and sister who is a nurse also helps to monitor with manual cuff.  Recently had Echo, stress test and ECG completed, released to continue workouts at [facility].  9/19 - Report blood pressure has been stable, range 125-130's/80-85.  Denies chest pain or discomfort         [name], RN, MSN [place] [phone]

## 2020-10-22 ENCOUNTER — Ambulatory Visit: Payer: Medicare Other | Admitting: Physician Assistant

## 2020-10-30 ENCOUNTER — Other Ambulatory Visit: Payer: Self-pay | Admitting: Surgery

## 2020-10-30 DIAGNOSIS — I7121 Aneurysm of the ascending aorta, without rupture: Secondary | ICD-10-CM

## 2020-11-21 ENCOUNTER — Other Ambulatory Visit: Payer: Self-pay

## 2020-11-21 ENCOUNTER — Ambulatory Visit (INDEPENDENT_AMBULATORY_CARE_PROVIDER_SITE_OTHER): Payer: Medicare Other

## 2020-11-21 ENCOUNTER — Ambulatory Visit (INDEPENDENT_AMBULATORY_CARE_PROVIDER_SITE_OTHER): Payer: Medicare Other | Admitting: Podiatry

## 2020-11-21 ENCOUNTER — Ambulatory Visit: Payer: Medicare Other | Admitting: Podiatry

## 2020-11-21 ENCOUNTER — Encounter: Payer: Self-pay | Admitting: Hematology & Oncology

## 2020-11-21 VITALS — BP 137/88 | HR 64 | Temp 97.9°F

## 2020-11-21 DIAGNOSIS — I2510 Atherosclerotic heart disease of native coronary artery without angina pectoris: Secondary | ICD-10-CM

## 2020-11-21 DIAGNOSIS — R2242 Localized swelling, mass and lump, left lower limb: Secondary | ICD-10-CM

## 2020-11-21 DIAGNOSIS — M7989 Other specified soft tissue disorders: Secondary | ICD-10-CM | POA: Diagnosis not present

## 2020-11-21 DIAGNOSIS — M79672 Pain in left foot: Secondary | ICD-10-CM

## 2020-11-21 DIAGNOSIS — R2241 Localized swelling, mass and lump, right lower limb: Secondary | ICD-10-CM | POA: Diagnosis not present

## 2020-11-21 DIAGNOSIS — M79671 Pain in right foot: Secondary | ICD-10-CM | POA: Diagnosis not present

## 2020-11-21 DIAGNOSIS — M722 Plantar fascial fibromatosis: Secondary | ICD-10-CM

## 2020-11-24 NOTE — Progress Notes (Signed)
Subjective:   Patient ID: Lonnie Hansen, male   DOB: 70 y.o.   MRN: [id number]   HPI 70 year old male presents the office today for concerns of a mass on the bottom of his right foot on the midfoot which is been present for least 5 years.  Occasional discomfort.  He said no recent injury.  Denies any recent treatment either for this.  Also he noticed an area of discomfort to his right foot on the second MPJ that he points to.  He does report that he dropped something on the top of his foot previously on the right foot.  Denies any open lesions.  He has no other concerns.   Review of Systems  All other systems reviewed and are negative.  Past Medical History:  Diagnosis Date   AAA (abdominal aortic aneurysm) (HCC)    Anemia    Arthritis    Asymptomatic bilateral carotid artery stenosis 08/2015   1-39%    Cataracts, bilateral    Chronic ITP (idiopathic thrombocytopenia) ([place]) 03/31/2018   Coronary artery disease    Diverticulosis    Enlarged aorta (HCC)    Enlarged prostate    slightly   GERD (gastroesophageal reflux disease)    takes Pantoprazole daily as needed   Glaucoma    uses eye drops daily   Headache    History of colon polyps    benign   History of kidney stones    Hyperlipidemia    no on any meds   Hypertension    takes Amlodipine and Atenolol daily   OSA on CPAP    Vocal cord nodule    pt. states  it's a" growth on vocal cord"    Past Surgical History:  Procedure Laterality Date   AORTIC ARCH ANGIOGRAPHY N/A 04/16/2016   Procedure: Aortic Arch Angiography;  Surgeon: [name] Martinique, MD;  Location: [place] CV [facility];  Service: Cardiovascular;  Laterality: N/A;   AORTIC VALVE REPLACEMENT N/A 09/15/2016   Procedure: AORTIC VALVE REPLACEMENT (AVR);  Surgeon: Grace Isaac, MD;  Location: [place];  Service: Open Heart Surgery;  Laterality: N/A;  Using 67mm Edwards Perimount Magna Ease Aortic Bioprosthesis Valve   ASCENDING AORTIC ROOT REPLACEMENT N/A 09/15/2016    Procedure: ASCENDING AORTIC ROOT REPLACEMENT;  Surgeon: Grace Isaac, MD;  Location: [place];  Service: Open Heart Surgery;  Laterality: N/A;  Using 36mm Gelweave Valsalva Graft   COLONOSCOPY     COLONOSCOPY Left 04/16/2019   Procedure: COLONOSCOPY;  Surgeon: Arta Silence, MD;  Location: [facility] ENDOSCOPY;  Service: Endoscopy;  Laterality: Left;   COLONOSCOPY N/A 06/01/2020   Procedure: COLONOSCOPY;  Surgeon: Ronnette Juniper, MD;  Location: [place];  Service: Gastroenterology;  Laterality: N/A;   COLONOSCOPY WITH ESOPHAGOGASTRODUODENOSCOPY (EGD)     ESOPHAGOGASTRODUODENOSCOPY N/A 05/25/2020   Procedure: ESOPHAGOGASTRODUODENOSCOPY (EGD);  Surgeon: Wilford Corner, MD;  Location: [place];  Service: Endoscopy;  Laterality: N/A;   ESOPHAGOGASTRODUODENOSCOPY (EGD) WITH PROPOFOL Left 04/16/2019   Procedure: ESOPHAGOGASTRODUODENOSCOPY (EGD) WITH PROPOFOL;  Surgeon: Arta Silence, MD;  Location: [facility] ENDOSCOPY;  Service: Endoscopy;  Laterality: Left;   FLEXIBLE SIGMOIDOSCOPY N/A 05/25/2020   Procedure: FLEXIBLE SIGMOIDOSCOPY;  Surgeon: Wilford Corner, MD;  Location: [place];  Service: Endoscopy;  Laterality: N/A;   IR THORACENTESIS ASP PLEURAL SPACE W/IMG GUIDE  10/23/2016   MICROLARYNGOSCOPY Right 09/20/2017   Procedure: MICROLARYNGOSCOPY WITH  EXCISION OF VOCAL CORD LESION;  Surgeon: Leta Baptist, MD;  Location: [place];  Service: ENT;  Laterality: Right;  MULTIPLE EXTRACTIONS WITH ALVEOLOPLASTY N/A 06/10/2016   Procedure: Extraction of tooth #'s 2,8,13,15, and 29  with alveoloplasty, maxillary right and left buccal exostoses reductions, and gross debridement of remaining teeth.;  Surgeon: Lenn Cal, DDS;  Location: [place];  Service: Oral Surgery;  Laterality: N/A;   RIGHT/LEFT HEART CATH AND CORONARY ANGIOGRAPHY N/A 04/16/2016   Procedure: Right/Left Heart Cath and Coronary Angiography;  Surgeon: [name] Martinique, MD;  Location: [place] [facility];  Service: Cardiovascular;   Laterality: N/A;   TEE WITHOUT CARDIOVERSION N/A 09/15/2016   Procedure: TRANSESOPHAGEAL ECHOCARDIOGRAM (TEE);  Surgeon: Grace Isaac, MD;  Location: [place];  Service: Open Heart Surgery;  Laterality: N/A;     Current Outpatient Medications:    acetaminophen (TYLENOL) 325 MG tablet, Take 2 tablets (650 mg total) by mouth every 6 (six) hours as needed for mild pain or fever., Disp: , Rfl:    amLODipine (NORVASC) 10 MG tablet, Take 10 mg by mouth daily at 12 noon., Disp: , Rfl:    atenolol (TENORMIN) 25 MG tablet, Take 1 tablet (25 mg total) by mouth 2 (two) times daily., Disp: 180 tablet, Rfl: 3   Ensure Plus (ENSURE PLUS) LIQD, Take 237 mLs by mouth daily., Disp: , Rfl:    ferrous sulfate 325 (65 FE) MG tablet, Take 325 mg by mouth daily at 12 noon., Disp: , Rfl:    latanoprost (XALATAN) 0.005 % ophthalmic solution, Place 1 drop into both eyes 2 (two) times daily., Disp: , Rfl:    losartan-hydrochlorothiazide (HYZAAR) 100-12.5 MG tablet, Take 1 tablet by mouth daily., Disp: , Rfl:    montelukast (SINGULAIR) 10 MG tablet, Take 10 mg by mouth daily., Disp: , Rfl:    Multiple Vitamin (MULTIVITAMIN WITH MINERALS) TABS tablet, Take 1 tablet by mouth daily at 12 noon., Disp: , Rfl:    pantoprazole (PROTONIX) 20 MG tablet, Take 20 mg by mouth 2 (two) times daily., Disp: , Rfl:    polyethylene glycol (MIRALAX / GLYCOLAX) 17 g packet, Take 17 g by mouth daily., Disp: 14 each, Rfl: 0   psyllium (HYDROCIL/METAMUCIL) 95 % PACK, Take 1 packet by mouth daily., Disp: 240 each, Rfl: 3   rosuvastatin (CRESTOR) 10 MG tablet, Take 1 tablet by mouth once daily (Patient taking differently: Take 10 mg by mouth daily at 12 noon.), Disp: 90 tablet, Rfl: 1   triamcinolone cream (KENALOG) 0.1 %, Apply 1 application topically 2 (two) times daily as needed (facial itching/rash)., Disp: , Rfl:   No Known Allergies       Objective:  Physical Exam  General: AAO x3, NAD  Dermatological: Skin is warm, dry and  supple bilateral.  There are no open sores, no preulcerative lesions, no rash or signs of infection present.  Vascular: Dorsalis Pedis artery and Posterior Tibial artery pedal pulses are 2/4 bilateral with immedate capillary fill time.  There is no pain with calf compression, swelling, warmth, erythema.   Neruologic: Grossly intact via light touch bilateral.   Musculoskeletal: On the plantar aspect the left midfoot is a large soft tissue mass present.  I do think it is likely a fibroma but given its size in order MRI.  No edema, erythema.  On the dorsal second MPJ there is also a mass present which appears to be somewhat firm.  This could be from an old hematoma, contusion.  Muscular strength 5/5 in all groups tested bilateral.  Gait: Unassisted, Nonantalgic.       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  No subacute fracture, calcifications or foreign body. -Given the size of the mass left foot I will order MRI to further evaluate this. -On the right foot were to monitor the soft tissue mass in the second MPJ.  This could be likely from injury.  If no improvement get an MRI of the right foot as well.  Biofreeze to help with the discomfort.  Discussed supportive shoe gear.  Trula Slade DPM

## 2020-11-28 ENCOUNTER — Inpatient Hospital Stay (HOSPITAL_BASED_OUTPATIENT_CLINIC_OR_DEPARTMENT_OTHER): Payer: Medicare Other | Admitting: Family

## 2020-11-28 ENCOUNTER — Other Ambulatory Visit: Payer: Self-pay

## 2020-11-28 ENCOUNTER — Inpatient Hospital Stay: Payer: Medicare Other | Attending: Hematology & Oncology

## 2020-11-28 ENCOUNTER — Telehealth: Payer: Self-pay | Admitting: *Deleted

## 2020-11-28 ENCOUNTER — Inpatient Hospital Stay: Payer: Medicare Other

## 2020-11-28 ENCOUNTER — Encounter: Payer: Self-pay | Admitting: Family

## 2020-11-28 VITALS — BP 154/83 | HR 58 | Temp 98.8°F | Resp 20 | Ht 68.0 in | Wt 204.0 lb

## 2020-11-28 DIAGNOSIS — D693 Immune thrombocytopenic purpura: Secondary | ICD-10-CM

## 2020-11-28 DIAGNOSIS — Z79899 Other long term (current) drug therapy: Secondary | ICD-10-CM | POA: Insufficient documentation

## 2020-11-28 LAB — CBC WITH DIFFERENTIAL (CANCER CENTER ONLY)
Abs Immature Granulocytes: 0.04 10*3/uL (ref 0.00–0.07)
Basophils Absolute: 0 10*3/uL (ref 0.0–0.1)
Basophils Relative: 0 %
Eosinophils Absolute: 0.2 10*3/uL (ref 0.0–0.5)
Eosinophils Relative: 4 %
HCT: 36.5 % — ABNORMAL LOW (ref 39.0–52.0)
Hemoglobin: 12.2 g/dL — ABNORMAL LOW (ref 13.0–17.0)
Immature Granulocytes: 1 %
Lymphocytes Relative: 22 %
Lymphs Abs: 1.3 10*3/uL (ref 0.7–4.0)
MCH: 31.3 pg (ref 26.0–34.0)
MCHC: 33.4 g/dL (ref 30.0–36.0)
MCV: 93.6 fL (ref 80.0–100.0)
Monocytes Absolute: 0.7 10*3/uL (ref 0.1–1.0)
Monocytes Relative: 11 %
Neutro Abs: 3.6 10*3/uL (ref 1.7–7.7)
Neutrophils Relative %: 62 %
Platelet Count: 78 10*3/uL — ABNORMAL LOW (ref 150–400)
RBC: 3.9 MIL/uL — ABNORMAL LOW (ref 4.22–5.81)
RDW: 13.9 % (ref 11.5–15.5)
WBC Count: 5.8 10*3/uL (ref 4.0–10.5)
nRBC: 0 % (ref 0.0–0.2)

## 2020-11-28 LAB — CMP (CANCER CENTER ONLY)
ALT: 12 U/L (ref 0–44)
AST: 19 U/L (ref 15–41)
Albumin: 4.7 g/dL (ref 3.5–5.0)
Alkaline Phosphatase: 45 U/L (ref 38–126)
Anion gap: 8 (ref 5–15)
BUN: 18 mg/dL (ref 8–23)
CO2: 30 mmol/L (ref 22–32)
Calcium: 10.2 mg/dL (ref 8.9–10.3)
Chloride: 102 mmol/L (ref 98–111)
Creatinine: 1.16 mg/dL (ref 0.61–1.24)
GFR, Estimated: >60 mL/min (ref >60)
Glucose, Bld: 111 mg/dL — ABNORMAL HIGH (ref 70–99)
Potassium: 3.9 mmol/L (ref 3.5–5.1)
Sodium: 140 mmol/L (ref 135–145)
Total Bilirubin: 0.7 mg/dL (ref 0.3–1.2)
Total Protein: 7.4 g/dL (ref 6.5–8.1)

## 2020-11-28 LAB — SAVE SMEAR(SSMR), FOR PROVIDER SLIDE REVIEW

## 2020-11-28 MED ORDER — ROMIPLOSTIM 125 MCG ~~LOC~~ SOLR
1.0000 ug/kg | Freq: Once | SUBCUTANEOUS | Status: AC
  Administered 2020-11-28: 95 ug via SUBCUTANEOUS
  Filled 2020-11-28: qty 0.19

## 2020-11-28 NOTE — Patient Instructions (Signed)
Romiplostim injection What is this medication? ROMIPLOSTIM (roe mi PLOE stim) helps your body make more platelets. This medicine is used to treat low platelets caused by chronic idiopathic thrombocytopenic purpura (ITP) or a bone marrow syndrome caused by radiation sickness. This medicine may be used for other purposes; ask your health care provider or pharmacist if you have questions. COMMON BRAND NAME(S): Nplate What should I tell my care team before I take this medication? They need to know if you have any of these conditions: blood clots myelodysplastic syndrome an unusual or allergic reaction to romiplostim, mannitol, other medicines, foods, dyes, or preservatives pregnant or trying to get pregnant breast-feeding How should I use this medication? This medicine is injected under the skin. It is given by a health care provider in a hospital or clinic setting. A special MedGuide will be given to you before each treatment. Be sure to read this information carefully each time. Talk to your health care provider about the use of this medicine in children. While it may be prescribed for children as young as newborns for selected conditions, precautions do apply. Overdosage: If you think you have taken too much of this medicine contact a poison control center or emergency room at once. NOTE: This medicine is only for you. Do not share this medicine with others. What if I miss a dose? Keep appointments for follow-up doses. It is important not to miss your dose. Call your health care provider if you are unable to keep an appointment. What may interact with this medication? Interactions are not expected. This list may not describe all possible interactions. Give your health care provider a list of all the medicines, herbs, non-prescription drugs, or dietary supplements you use. Also tell them if you smoke, drink alcohol, or use illegal drugs. Some items may interact with your medicine. What should I  watch for while using this medication? Visit your health care provider for regular checks on your progress. You may need blood work done while you are taking this medicine. Your condition will be monitored carefully while you are receiving this medicine. It is important not to miss any appointments. What side effects may I notice from receiving this medication? Side effects that you should report to your doctor or health care professional as soon as possible: allergic reactions (skin rash, itching or hives; swelling of the face, lips, or tongue) bleeding (bloody or black, tarry stools; red or dark brown urine; spitting up blood or brown material that looks like coffee grounds; red spots on the skin; unusual bruising or bleeding from the eyes, gums, or nose) blood clot (chest pain; shortness of breath; pain, swelling, or warmth in the leg) stroke (changes in vision; confusion; trouble speaking or understanding; severe headaches; sudden numbness or weakness of the face, arm or leg; trouble walking; dizziness; loss of balance or coordination) Side effects that usually do not require medical attention (report to your doctor or health care professional if they continue or are bothersome): diarrhea dizziness headache joint pain muscle pain stomach pain trouble sleeping This list may not describe all possible side effects. Call your doctor for medical advice about side effects. You may report side effects to FDA at 1-800-FDA-1088. Where should I keep my medication? This medicine is given in a hospital or clinic. It will not be stored at home. NOTE: This sheet is a summary. It may not cover all possible information. If you have questions about this medicine, talk to your doctor, pharmacist, or health care provider.    2022 Elsevier/Gold Standard (2019-02-27 10:28:13)  

## 2020-11-28 NOTE — Telephone Encounter (Addendum)
Patient has an appointment on 12/09/2020 at [facility] imaging. Lattie Haw

## 2020-11-28 NOTE — Progress Notes (Signed)
Hematology and Oncology Follow Up Visit  [name] [id number] 25-Jul-1950 70 y.o. 11/28/2020   Principle Diagnosis:  Chronic ITP   Current Therapy:        Weekly Nplate to keep platelet count over 100,000   Interim History:  [name] is here today for follow-up and Nplate injection. He continues to do well and we had a wonderful discussion on trumpet playing and classic jazz musicians.  He has had some mild bruising on his right arm that has healed nicely. No blood loss, no petechiae.  Platelet count today is 78, Hgb 12.2, MCV 93 and WBC count 5.8.  No swelling, tenderness, numbness or tingling in his extremities at this time.  He ambulates with a cane for added support.  No falls or syncope to report.  He has a good appetite and is staying well hydrated. His weight is stable at 204 lbs.  No fever, chills, n/v, cough, rash, dizziness, SOB, chest pain, palpitations, abdominal pain or changes in bowel or bladder habits.   ECOG Performance Status: 1 - Symptomatic but completely ambulatory  Medications:  Allergies as of 11/28/2020   No Known Allergies      Medication List        Accurate as of November 28, 2020 10:31 AM. If you have any questions, ask your nurse or doctor.          acetaminophen 325 MG tablet Commonly known as: TYLENOL Take 2 tablets (650 mg total) by mouth every 6 (six) hours as needed for mild pain or fever.   amLODipine 10 MG tablet Commonly known as: NORVASC Take 10 mg by mouth daily at 12 noon.   atenolol 25 MG tablet Commonly known as: TENORMIN Take 1 tablet (25 mg total) by mouth 2 (two) times daily.   Ensure Plus Liqd Take 237 mLs by mouth daily.   ferrous sulfate 325 (65 FE) MG tablet Take 325 mg by mouth daily at 12 noon.   latanoprost 0.005 % ophthalmic solution Commonly known as: XALATAN Place 1 drop into both eyes 2 (two) times daily.   losartan-hydrochlorothiazide 100-12.5 MG tablet Commonly known as: HYZAAR Take 1 tablet  by mouth daily.   montelukast 10 MG tablet Commonly known as: SINGULAIR Take 10 mg by mouth daily.   multivitamin with minerals Tabs tablet Take 1 tablet by mouth daily at 12 noon.   pantoprazole 20 MG tablet Commonly known as: PROTONIX Take 20 mg by mouth 2 (two) times daily.   polyethylene glycol 17 g packet Commonly known as: MIRALAX / GLYCOLAX Take 17 g by mouth daily.   psyllium 95 % Pack Commonly known as: HYDROCIL/METAMUCIL Take 1 packet by mouth daily.   rosuvastatin 10 MG tablet Commonly known as: CRESTOR Take 1 tablet by mouth once daily What changed: when to take this   triamcinolone cream 0.1 % Commonly known as: KENALOG Apply 1 application topically 2 (two) times daily as needed (facial itching/rash).        Allergies: No Known Allergies  Past Medical History, Surgical history, Social history, and Family History were reviewed and updated.  Review of Systems: All other 10 point review of systems is negative.   Physical Exam:  height is 5\' 8"  (1.727 m) and weight is 204 lb (92.5 kg). His oral temperature is 98.8 F (37.1 C). His blood pressure is 154/83 (abnormal) and his pulse is 58 (abnormal). His respiration is 20 and oxygen saturation is 100%.   Wt Readings from Last 3 Encounters:  11/28/20 204 lb (92.5 kg)  10/01/20 203 lb 5 oz (92.2 kg)  09/11/20 204 lb (92.5 kg)    Ocular: Sclerae unicteric, pupils equal, round and reactive to light Ear-nose-throat: Oropharynx clear, dentition fair Lymphatic: No cervical or supraclavicular adenopathy Lungs no rales or rhonchi, good excursion bilaterally Heart regular rate and rhythm, no murmur appreciated Abd soft, nontender, positive bowel sounds MSK no focal spinal tenderness, no joint edema Neuro: non-focal, well-oriented, appropriate affect Breasts: Deferred   Lab Results  Component Value Date   WBC 5.8 11/28/2020   HGB 12.2 (L) 11/28/2020   HCT 36.5 (L) 11/28/2020   MCV 93.6 11/28/2020   PLT 78  (L) 11/28/2020   No results found for: FERRITIN, IRON, TIBC, UIBC, IRONPCTSAT Lab Results  Component Value Date   RBC 3.90 (L) 11/28/2020   No results found for: KPAFRELGTCHN, LAMBDASER, KAPLAMBRATIO No results found for: IGGSERUM, IGA, IGMSERUM No results found for: Odetta Pink, SPEI   Chemistry      Component Value Date/Time   NA 140 11/28/2020 0953   NA 140 05/11/2019 0946   K 3.9 11/28/2020 0953   CL 102 11/28/2020 0953   CO2 30 11/28/2020 0953   BUN 18 11/28/2020 0953   BUN 17 05/11/2019 0946   CREATININE 1.16 11/28/2020 0953   CREATININE 0.75 04/10/2016 1132      Component Value Date/Time   CALCIUM 10.2 11/28/2020 0953   ALKPHOS 45 11/28/2020 0953   AST 19 11/28/2020 0953   ALT 12 11/28/2020 0953   BILITOT 0.7 11/28/2020 0953       Impression and Plan: [name] is a very pleasant 70 yo African American gentleman with chronic ITP.  He received Nplate today, platelets are 78.  Follow-up in 2 months per MD.  He can contact our office with any questions or concerns.   Lonnie Dawson, NP 11/3/202210:31 AM

## 2020-11-29 ENCOUNTER — Inpatient Hospital Stay: Payer: Medicare Other

## 2020-11-29 ENCOUNTER — Telehealth: Payer: Self-pay | Admitting: *Deleted

## 2020-11-29 ENCOUNTER — Inpatient Hospital Stay: Payer: Medicare Other | Admitting: Hematology & Oncology

## 2020-11-29 NOTE — Telephone Encounter (Signed)
Per 11/28/20 los - called and lvm of upcoming appointments - requested callback to confirm

## 2020-12-02 ENCOUNTER — Ambulatory Visit: Payer: Medicare Other | Admitting: *Deleted

## 2020-12-03 ENCOUNTER — Other Ambulatory Visit: Payer: Self-pay | Admitting: *Deleted

## 2020-12-03 NOTE — Patient Outreach (Signed)
[facility]) Care Management  [place]  12/03/2020   Lonnie Hansen 1950/04/13 [id number]   Outgoing call placed to member, state he is doing well.  Denies any urgent concerns, encouraged to contact this care manager with questions.    Encounter Medications:  Outpatient Encounter Medications as of 12/03/2020  Medication Sig   acetaminophen (TYLENOL) 325 MG tablet Take 2 tablets (650 mg total) by mouth every 6 (six) hours as needed for mild pain or fever.   amLODipine (NORVASC) 10 MG tablet Take 10 mg by mouth daily at 12 noon.   atenolol (TENORMIN) 25 MG tablet Take 1 tablet (25 mg total) by mouth 2 (two) times daily.   Ensure Plus (ENSURE PLUS) LIQD Take 237 mLs by mouth daily.   ferrous sulfate 325 (65 FE) MG tablet Take 325 mg by mouth daily at 12 noon.   latanoprost (XALATAN) 0.005 % ophthalmic solution Place 1 drop into both eyes 2 (two) times daily.   losartan-hydrochlorothiazide (HYZAAR) 100-12.5 MG tablet Take 1 tablet by mouth daily.   montelukast (SINGULAIR) 10 MG tablet Take 10 mg by mouth daily.   Multiple Vitamin (MULTIVITAMIN WITH MINERALS) TABS tablet Take 1 tablet by mouth daily at 12 noon.   pantoprazole (PROTONIX) 20 MG tablet Take 20 mg by mouth 2 (two) times daily.   polyethylene glycol (MIRALAX / GLYCOLAX) 17 g packet Take 17 g by mouth daily.   psyllium (HYDROCIL/METAMUCIL) 95 % PACK Take 1 packet by mouth daily.   rosuvastatin (CRESTOR) 10 MG tablet Take 1 tablet by mouth once daily (Patient taking differently: Take 10 mg by mouth daily at 12 noon.)   triamcinolone cream (KENALOG) 0.1 % Apply 1 application topically 2 (two) times daily as needed (facial itching/rash).   No facility-administered encounter medications on file as of 12/03/2020.    Functional Status:  In your present state of health, do you have any difficulty performing the following activities: 05/31/2020 05/31/2020  Hearing? N -  Vision? N -  Difficulty concentrating or  making decisions? N -  Walking or climbing stairs? N -  Dressing or bathing? N -  Doing errands, shopping? - N  Some recent data might be hidden    Fall/Depression Screening: Fall Risk  05/30/2020 05/31/2017 12/03/2016  Falls in the past year? 0 No No  Number falls in past yr: 0 - -  Injury with Fall? 0 - -   PHQ 2/9 Scores 05/30/2020 03/15/2017 12/07/2016  PHQ - 2 Score 0 0 0    Assessment:   Care Plan Care Plan : General Plan of Care (Adult)  Updates made by [name], RN since 12/03/2020 12:00 AM     Problem: Knowledge deficit related to management of chronic medical conditions (ITP and HTN)   Priority: High     Long-Range Goal: Patient will verbalize and display chronic health management evidenced by no hospital admissions in the next 6 months   Start Date: 09/19/2020  Expected End Date: 03/20/2021  This Visit's Progress: On track  Recent Progress: On track  Priority: High  Note:   Current Barriers:  Knowledge Deficits related to plan of care for management of HTN and ITP  Chronic Disease Management support and education needs related to HTN and ITP  RNCM Clinical Goal(s):  Patient will verbalize understanding of plan for management of HTN and ITP take all medications exactly as prescribed and will call provider for medication related questions attend all scheduled medical appointments: PCP, Cardiology, and  Hematology continue to work with RN Care Manager to address care management and care coordination needs related to HTN and ITP  through collaboration with RN Care manager, provider, and care team.   Interventions: Inter-dis1:1 collaboration with primary care provider regarding development and update of comprehensive plan of care as evidenced by provider attestation and co-signatureciplinary care team collaboration (see longitudinal plan of care) Evaluation of current treatment plan related to  self management and patient's adherence to plan as established by  provider   Hypertension: (Status: Goal on track: YES.) Last practice recorded BP readings:  BP Readings from Last 3 Encounters:  09/11/20 (!) 144/84  08/26/20 (!) 171/74  08/05/20 (!) 156/88  Most recent eGFR/CrCl: No results found for: EGFR  No components found for: CRCL  Evaluation of current treatment plan related to hypertension self management and patient's adherence to plan as established by provider;   Reviewed prescribed diet low salt Advised patient, providing education and rationale, to monitor blood pressure daily and record, calling PCP for findings outside established parameters;  Discussed complications of poorly controlled blood pressure such as heart disease, stroke, circulatory complications, vision complications, kidney impairment, sexual dysfunction;  Screening for signs and symptoms of depression related to chronic disease state;   11/8 - Admits that he is not consistent with monitoring BP daily.  Strongly encouraged to do so and record readings, state he will try.     ITPNew goal. Evaluation of current treatment plan related to  ITP , self-management and patient's adherence to plan as established by provider. Discussed plans with patient for ongoing care management follow up and provided patient with direct contact information for care management team Advised patient to call provider with any signs of bleeding.  Platetlets in August were 99,000, on 11/3 decreased to 78,000; Provided education to patient re: management of ITP and chronic anemia; Reviewed medications with patient and discussed schedule of follow up, including next set of labs and pending injection;  11/8 - Member state he has several follow up appointments in the next few weeks.  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, 12/5 with cardiology, and 1/3 with oncology for labs and office visit  Patient Goals/Self-Care Activities: Patient  will attend all scheduled provider appointments Patient will call pharmacy for medication refills Patient will call provider office for new concerns or questions        Goals Addressed             This Visit's Progress    COMPLETED: [facility] - [facility] My Quality of Life       Timeframe:  Long-Range Goal Priority:  Medium Start Date:     5/5                        Expected End Date:   11/5               Barriers: Knowledge    - complete a living will - discuss my treatment options with the doctor or nurse    Why is this important?   Having a long-term illness can be scary.  It can also be stressful for you and your caregiver.  These steps may help.    Notes:   5/5 - Educated on signs of bleeding.  Encouraged to monitor blood pressure daily and record readings.  Blood pressure monitor sent.  5/11 - Discussed recent  discharge instructions.  Call placed to PCP office to follow up on member's question about medications  5/26 - Denies any further bleeding, report blood levels are starting to increase  6/22 - Report he has started Romiplostim injections, platelet count has increased from 50,000 in May to over 100,000 with his recent labs on 6/20  7/21 - Continues follow up with hematology to monitor platelet levels.  Denies bleeding  8/25 - Platelet levels remain normal currently, follow up with hematology on 9/6  9/19 - Appointment with hematology completed on 9/6, platelets decreased but remain stable at 83,000.  Will follow up with provider in November.    11/8 - Resolving due to duplicate goal      COMPLETED: [facility] - Track and Manage My Blood Pressure-Hypertension       Timeframe:  Short-Term Goal Priority:  High Start Date:             8/25                Expected End Date:      11/25             Barriers: Health Behaviors Knowledge       Follow Up Date 10/20/2020    - check blood pressure daily - write blood pressure results in a log or diary    Why is this  important?   You won't feel high blood pressure, but it can still hurt your blood vessels.  High blood pressure can cause heart or kidney problems. It can also cause a stroke.  Making lifestyle changes like losing a little weight or eating less salt will help.  Checking your blood pressure at home and at different times of the day can help to control blood pressure.  If the doctor prescribes medicine remember to take it the way the doctor ordered.  Call the office if you cannot afford the medicine or if there are questions about it.     Notes:   8/25 - Educated on importance of monitoring and managing blood pressure.  Confirmed he has blood pressure monitor and sister who is a nurse also helps to monitor with manual cuff.  Recently had Echo, stress test and ECG completed, released to continue workouts at [facility].  9/19 - Report blood pressure has been stable, range 125-130's/80-85.  Denies chest pain or discomfort  11/8 - Resolving due to duplicate goal         Plan:  Follow-up: Patient agrees to Care Plan and Follow-up. Follow-up in 2 month(s)  [name], RN, MSN [place] [phone]

## 2020-12-09 ENCOUNTER — Ambulatory Visit
Admission: RE | Admit: 2020-12-09 | Discharge: 2020-12-09 | Disposition: A | Discharge status: Home / Self Care | Payer: Medicare Other | Source: Ambulatory Visit | Attending: Podiatry | Admitting: Podiatry

## 2020-12-09 ENCOUNTER — Other Ambulatory Visit: Payer: Self-pay

## 2020-12-09 DIAGNOSIS — M79672 Pain in left foot: Secondary | ICD-10-CM

## 2020-12-09 DIAGNOSIS — M19072 Primary osteoarthritis, left ankle and foot: Secondary | ICD-10-CM | POA: Diagnosis not present

## 2020-12-09 DIAGNOSIS — M2012 Hallux valgus (acquired), left foot: Secondary | ICD-10-CM | POA: Diagnosis not present

## 2020-12-09 DIAGNOSIS — R2242 Localized swelling, mass and lump, left lower limb: Secondary | ICD-10-CM | POA: Diagnosis not present

## 2020-12-11 ENCOUNTER — Ambulatory Visit (INDEPENDENT_AMBULATORY_CARE_PROVIDER_SITE_OTHER): Payer: Medicare Other | Admitting: Surgical

## 2020-12-11 ENCOUNTER — Other Ambulatory Visit: Payer: Self-pay

## 2020-12-11 ENCOUNTER — Other Ambulatory Visit: Payer: Medicare Other

## 2020-12-11 ENCOUNTER — Ambulatory Visit
Admission: RE | Admit: 2020-12-11 | Discharge: 2020-12-11 | Disposition: A | Discharge status: Home / Self Care | Payer: Medicare Other | Source: Ambulatory Visit | Attending: Surgery | Admitting: Surgery

## 2020-12-11 ENCOUNTER — Ambulatory Visit: Payer: Medicare Other

## 2020-12-11 ENCOUNTER — Encounter: Payer: Self-pay | Admitting: Surgical

## 2020-12-11 VITALS — BP 150/83 | HR 63 | Resp 20 | Ht 68.0 in | Wt 212.0 lb

## 2020-12-11 DIAGNOSIS — I712 Thoracic aortic aneurysm, without rupture, unspecified: Secondary | ICD-10-CM | POA: Diagnosis not present

## 2020-12-11 DIAGNOSIS — I7121 Aneurysm of the ascending aorta, without rupture: Secondary | ICD-10-CM

## 2020-12-11 DIAGNOSIS — I2510 Atherosclerotic heart disease of native coronary artery without angina pectoris: Secondary | ICD-10-CM

## 2020-12-11 MED ORDER — IOPAMIDOL (ISOVUE-370) INJECTION 76%
75.0000 mL | Freq: Once | INTRAVENOUS | Status: AC | PRN
  Administered 2020-12-11: 75 mL via INTRAVENOUS

## 2020-12-11 NOTE — Progress Notes (Signed)
Subjective:    Patient ID: Lonnie Hansen, male    DOB: Feb 15, 1950, 70 y.o.   MRN: [id number]  Chief Complaint  Patient presents with   Thoracic Aortic Aneurysm    1 year f/u with Chest CTA      HPI Patient is in today for patient is a 70 year old male status post previous biological [place] with replacement of the aortic valve with a tissue valve and replacement of the aortic root and ascending aorta with a 32 mm Gelweave Valsalva graft in August 2018 by Dr. Servando Snare.  He is noted to have a increased root measurement as well as transverse aortic arch on CTA last year at 4.3 cm in diameter which was stable overall.  He is seen today in surveillance with CTA and found to have maximum diameter of the ascending aorta at 4.6 cm.  The patient has been feeling fairly well.  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  He does note that his blood pressure has been somewhat elevated recently as it was today.  He denies chest pain.  He does have occasional headaches.  He has not had any significant dizziness or syncope.  He is not having palpitations.  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 states  it's a" growth on vocal cord"    Past Surgical History:  Procedure Laterality Date   AORTIC ARCH ANGIOGRAPHY N/A 04/16/2016   Procedure: Aortic Arch  Angiography;  Surgeon: [name] Martinique, MD;  Location: [place] [facility];  Service: Cardiovascular;  Laterality: N/A;   AORTIC VALVE REPLACEMENT N/A 09/15/2016   Procedure: AORTIC VALVE REPLACEMENT (AVR);  Surgeon: Grace Isaac, MD;  Location: [place];  Service: Open Heart Surgery;  Laterality: N/A;  Using 68mm Edwards Perimount Magna Ease Aortic Bioprosthesis Valve   ASCENDING AORTIC ROOT REPLACEMENT N/A 09/15/2016   Procedure: ASCENDING AORTIC ROOT REPLACEMENT;  Surgeon: Grace Isaac, MD;  Location: [place];  Service: Open Heart Surgery;  Laterality: N/A;  Using 42mm Gelweave Valsalva Graft   COLONOSCOPY     COLONOSCOPY Left 04/16/2019   Procedure: COLONOSCOPY;  Surgeon: Arta Silence, MD;  Location: [facility];  Service: Endoscopy;  Laterality: Left;   COLONOSCOPY N/A 06/01/2020   Procedure: COLONOSCOPY;  Surgeon: Ronnette Juniper, MD;  Location: [place];  Service: Gastroenterology;  Laterality: N/A;   COLONOSCOPY WITH ESOPHAGOGASTRODUODENOSCOPY (EGD)     ESOPHAGOGASTRODUODENOSCOPY N/A 05/25/2020   Procedure: ESOPHAGOGASTRODUODENOSCOPY (EGD);  Surgeon: Wilford Corner, MD;  Location: [place];  Service: Endoscopy;  Laterality: N/A;   ESOPHAGOGASTRODUODENOSCOPY (EGD) WITH PROPOFOL Left 04/16/2019   Procedure: ESOPHAGOGASTRODUODENOSCOPY (EGD) WITH PROPOFOL;  Surgeon: Arta Silence, MD;  Location: [facility];  Service: Endoscopy;  Laterality: Left;   FLEXIBLE SIGMOIDOSCOPY N/A 05/25/2020   Procedure: FLEXIBLE SIGMOIDOSCOPY;  Surgeon: Wilford Corner, MD;  Location: [place];  Service: Endoscopy;  Laterality: N/A;   IR THORACENTESIS ASP PLEURAL SPACE W/IMG GUIDE  10/23/2016   MICROLARYNGOSCOPY Right 09/20/2017   Procedure: MICROLARYNGOSCOPY WITH  EXCISION OF VOCAL CORD LESION;  Surgeon: Leta Baptist, MD;  Location: [place];  Service: ENT;  Laterality: Right;   MULTIPLE EXTRACTIONS WITH ALVEOLOPLASTY N/A 06/10/2016   Procedure: Extraction of tooth #'s 2,8,13,15, and  29  with alveoloplasty, maxillary right and left buccal exostoses reductions, and gross debridement of remaining teeth.;  Surgeon: Lenn Cal, DDS;  Location: [place];  Service: Oral Surgery;  Laterality: N/A;   RIGHT/LEFT HEART CATH AND CORONARY ANGIOGRAPHY N/A 04/16/2016   Procedure: Right/Left Heart Cath and Coronary Angiography;  Surgeon: [name] Martinique, MD;  Location: [place] [facility];  Service: Cardiovascular;  Laterality: N/A;   TEE WITHOUT CARDIOVERSION N/A 09/15/2016   Procedure: TRANSESOPHAGEAL ECHOCARDIOGRAM (TEE);  Surgeon: Grace Isaac, MD;  Location: [place];  Service: Open Heart Surgery;  Laterality: N/A;    Family History  Problem Relation Age of Onset   Heart disease Father    Heart attack Father    Thyroid disease Sister     Social History   Socioeconomic History   Marital status: Married    Spouse name: Not on file   Number of children: 1   Years of education: Not on file   Highest education level: Not on file  Occupational History   Occupation: Musician  Tobacco Use   Smoking status: Former    Packs/day: 1.00    Years: 25.00    Pack years: 25.00    Types: Cigarettes   Smokeless tobacco: Never  Vaping Use   Vaping Use: Never used  Substance and Sexual Activity   Alcohol use: No    Alcohol/week: 0.0 standard drinks   Drug use: No   Sexual activity: Not on file  Other Topics Concern   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   Social Determinants of Health   Financial Resource Strain: Not on file  Food Insecurity: No Food Insecurity   Worried About Charity fundraiser in the Last Year: Never true   Ran Out of Food in the Last Year: Never true  Transportation Needs: No Transportation Needs   Lack of Transportation (Medical): No   Lack of Transportation (Non-Medical): No  Physical Activity: Not on file  Stress: Not on file  Social Connections: Not on file  Intimate Partner Violence:  Not on file    Outpatient Medications Prior to Visit  Medication Sig Dispense Refill   acetaminophen (TYLENOL) 325 MG tablet Take 2 tablets (650 mg total) by mouth every 6 (six) hours as needed for mild pain or fever.     amLODipine (NORVASC) 10 MG tablet Take 10 mg by mouth daily at 12 noon.     atenolol (TENORMIN) 25 MG tablet Take 1 tablet (25 mg total) by mouth 2 (two) times daily. 180 tablet 3   Ensure Plus (ENSURE PLUS) LIQD Take 237 mLs by mouth daily.     ferrous sulfate 325 (65 FE) MG tablet Take 325 mg by mouth daily at 12 noon.     latanoprost (XALATAN) 0.005 % ophthalmic solution Place 1 drop into both  eyes 2 (two) times daily.     losartan-hydrochlorothiazide (HYZAAR) 100-12.5 MG tablet Take 1 tablet by mouth daily.     montelukast (SINGULAIR) 10 MG tablet Take 10 mg by mouth daily.     Multiple Vitamin (MULTIVITAMIN WITH MINERALS) TABS tablet Take 1 tablet by mouth daily at 12 noon.     pantoprazole (PROTONIX) 20 MG tablet Take 20 mg by mouth 2 (two) times daily.     polyethylene glycol (MIRALAX / GLYCOLAX) 17 g packet Take 17 g by mouth daily. 14 each 0   psyllium (HYDROCIL/METAMUCIL) 95 % PACK Take 1 packet by mouth daily. 240 each 3   rosuvastatin (CRESTOR) 10 MG tablet Take 1 tablet by mouth once daily (Patient taking differently: Take 10 mg by mouth daily at 12 noon.) 90 tablet 1   triamcinolone cream (KENALOG) 0.1 % Apply 1 application topically 2 (two) times daily as needed (facial itching/rash).     No facility-administered medications prior to visit.    No Known Allergies  CT ANGIO CHEST AORTA W/CM & OR WO/CM  Result Date: 12/11/2020 CLINICAL DATA:  Thoracic aortic aneurysm follow-up. EXAM: CT ANGIOGRAPHY CHEST WITH CONTRAST TECHNIQUE: Multidetector CT imaging of the chest was performed using the standard protocol during bolus administration of intravenous contrast. Multiplanar CT image reconstructions and MIPs were obtained to evaluate the vascular anatomy.  CONTRAST:  38mL ISOVUE-370 IOPAMIDOL (ISOVUE-370) INJECTION 76% COMPARISON:  CTA chest dated November 30, 2019. FINDINGS: Cardiovascular: Prior ascending thoracic aorta repair. Unchanged aneurysmal dilatation of the aortic arch measuring up to 4.6 cm (when re-measured in the same axial plane on series 5, image 44). No dissection. Normal heart size. Prior AVR. No pericardial effusion. No pulmonary embolism. Mediastinum/Nodes: No enlarged mediastinal, hilar, or axillary lymph nodes. Thyroid gland, trachea, and esophagus demonstrate no significant findings. Lungs/Pleura: No focal consolidation, pleural effusion, or pneumothorax. Upper Abdomen: No acute abnormality. Unchanged gallstones, bilateral renal cysts, and dystrophic calcification in the upper pole of the left kidney. Musculoskeletal: No chest wall abnormality. No acute or significant osseous findings. Review of the MIP images confirms the above findings. IMPRESSION: 1. Prior ascending thoracic aorta repair with unchanged aneurysmal dilatation of the aortic arch measuring up to 4.6 cm. Ascending thoracic aortic aneurysm. Recommend semi-annual imaging followup by CTA or MRA and referral to cardiothoracic surgery if not already obtained. This recommendation follows 2010 ACCF/AHA/AATS/ACR/ASA/SCA/SCAI/SIR/STS/SVM Guidelines for the Diagnosis and Management of Patients With Thoracic Aortic Disease. Circulation. 2010; 121: R518-A416. Aortic aneurysm NOS (ICD10-I71.9) Electronically Signed   By: Titus Dubin M.D.   On: 12/11/2020 14:30        Objective:    Physical Exam Constitutional:      General: He is not in acute distress.    Appearance: Normal appearance. He is not ill-appearing or diaphoretic.  HENT:     Head: Normocephalic and atraumatic.  Cardiovascular:     Rate and Rhythm: Normal rate and regular rhythm.     Heart sounds: Murmur heard.     Comments: Soft systolic mitral murmur Pulmonary:     Effort: Pulmonary effort is normal.     Breath  sounds: Normal breath sounds.  Abdominal:     Palpations: Abdomen is soft.  Musculoskeletal:     Right lower leg: No edema.     Left lower leg: No edema.  Skin:    General: Skin is warm and dry.  Neurological:     Mental Status: He is alert.    BP (!) 150/83   Pulse  63   Resp 20   Ht 5\' 8"  (1.727 m)   Wt 212 lb (96.2 kg)   SpO2 97% Comment: RA  BMI 32.23 kg/m  Wt Readings from Last 3 Encounters:  12/11/20 212 lb (96.2 kg)  11/28/20 204 lb (92.5 kg)  10/01/20 203 lb 5 oz (92.2 kg)    Health Maintenance Due  Topic Date Due   Pneumonia Vaccine 10+ Years old (1 - PCV) Never done   Hepatitis C Screening  Never done   TETANUS/TDAP  Never done   Zoster Vaccines- Shingrix (1 of 2) Never done   COVID-19 Vaccine (3 - Pfizer risk series) 04/29/2019   INFLUENZA VACCINE  08/26/2020    There are no preventive care reminders to display for this patient.   Lab Results  Component Value Date   TSH 3.190 08/17/2018   Lab Results  Component Value Date   WBC 5.8 11/28/2020   HGB 12.2 (L) 11/28/2020   HCT 36.5 (L) 11/28/2020   MCV 93.6 11/28/2020   PLT 78 (L) 11/28/2020   Lab Results  Component Value Date   NA 140 11/28/2020   K 3.9 11/28/2020   CO2 30 11/28/2020   GLUCOSE 111 (H) 11/28/2020   BUN 18 11/28/2020   CREATININE 1.16 11/28/2020   BILITOT 0.7 11/28/2020   ALKPHOS 45 11/28/2020   AST 19 11/28/2020   ALT 12 11/28/2020   PROT 7.4 11/28/2020   ALBUMIN 4.7 11/28/2020   CALCIUM 10.2 11/28/2020   ANIONGAP 8 11/28/2020   Lab Results  Component Value Date   CHOL 157 05/11/2019   Lab Results  Component Value Date   HDL 32 (L) 05/11/2019   Lab Results  Component Value Date   LDLCALC 81 05/11/2019   Lab Results  Component Value Date   TRIG 201 (H) 05/26/2020   Lab Results  Component Value Date   CHOLHDL 4.9 05/11/2019   Lab Results  Component Value Date   HGBA1C 5.3 09/11/2016       Assessment & Plan:   Problem List Items Addressed This Visit      Thoracic aortic aneurysm - Primary   The patient's aneurysm measures 4.6 cm in maximal diameter.  We will get a repeat CTA of the chest in 6 months and he should be seen by the surgeon at that time as it is greater than 4.5.  Patient is on multiple medications currently for blood pressure.  I have asked him to keep a blood pressure diary and contact his primary care physician Dr. Sharlett Iles as he may need additional medications versus higher dosing.  He does understand the importance of this in regards to his medical condition.  He also understands that he needs to do better in terms of activity and diet as he admits he does eat somewhat poorly from an nutritional standpoint.   No orders of the defined types were placed in this encounter.    [name], PA-C

## 2020-12-12 DIAGNOSIS — N401 Enlarged prostate with lower urinary tract symptoms: Secondary | ICD-10-CM | POA: Diagnosis not present

## 2020-12-12 DIAGNOSIS — R351 Nocturia: Secondary | ICD-10-CM | POA: Diagnosis not present

## 2020-12-18 ENCOUNTER — Other Ambulatory Visit: Payer: Self-pay | Admitting: Internal Medicine

## 2020-12-18 ENCOUNTER — Inpatient Hospital Stay (HOSPITAL_COMMUNITY)
Admission: EM | Admit: 2020-12-18 | Discharge: 2020-12-20 | DRG: 418 | Disposition: A | Discharge status: Home / Self Care | Payer: Medicare Other | Attending: Family Medicine | Admitting: Family Medicine

## 2020-12-18 ENCOUNTER — Other Ambulatory Visit: Payer: Self-pay

## 2020-12-18 ENCOUNTER — Inpatient Hospital Stay (HOSPITAL_COMMUNITY)
Admission: RE | Admit: 2020-12-18 | Discharge: 2020-12-18 | Disposition: A | Discharge status: Home / Self Care | Payer: Medicare Other | Source: Ambulatory Visit | Attending: Internal Medicine | Admitting: Internal Medicine

## 2020-12-18 ENCOUNTER — Encounter (HOSPITAL_COMMUNITY): Payer: Self-pay | Admitting: Emergency Medicine

## 2020-12-18 ENCOUNTER — Other Ambulatory Visit (HOSPITAL_COMMUNITY): Payer: Self-pay | Admitting: Internal Medicine

## 2020-12-18 DIAGNOSIS — K819 Cholecystitis, unspecified: Secondary | ICD-10-CM | POA: Diagnosis not present

## 2020-12-18 DIAGNOSIS — K8050 Calculus of bile duct without cholangitis or cholecystitis without obstruction: Secondary | ICD-10-CM | POA: Diagnosis not present

## 2020-12-18 DIAGNOSIS — R9431 Abnormal electrocardiogram [ECG] [EKG]: Secondary | ICD-10-CM | POA: Diagnosis not present

## 2020-12-18 DIAGNOSIS — Z79899 Other long term (current) drug therapy: Secondary | ICD-10-CM

## 2020-12-18 DIAGNOSIS — K429 Umbilical hernia without obstruction or gangrene: Secondary | ICD-10-CM | POA: Diagnosis present

## 2020-12-18 DIAGNOSIS — K219 Gastro-esophageal reflux disease without esophagitis: Secondary | ICD-10-CM | POA: Diagnosis present

## 2020-12-18 DIAGNOSIS — Z8679 Personal history of other diseases of the circulatory system: Secondary | ICD-10-CM

## 2020-12-18 DIAGNOSIS — M1990 Unspecified osteoarthritis, unspecified site: Secondary | ICD-10-CM | POA: Diagnosis present

## 2020-12-18 DIAGNOSIS — J382 Nodules of vocal cords: Secondary | ICD-10-CM | POA: Diagnosis present

## 2020-12-18 DIAGNOSIS — R001 Bradycardia, unspecified: Secondary | ICD-10-CM | POA: Diagnosis present

## 2020-12-18 DIAGNOSIS — Z20822 Contact with and (suspected) exposure to covid-19: Secondary | ICD-10-CM | POA: Diagnosis present

## 2020-12-18 DIAGNOSIS — Z952 Presence of prosthetic heart valve: Secondary | ICD-10-CM | POA: Diagnosis not present

## 2020-12-18 DIAGNOSIS — N179 Acute kidney failure, unspecified: Secondary | ICD-10-CM | POA: Diagnosis present

## 2020-12-18 DIAGNOSIS — N400 Enlarged prostate without lower urinary tract symptoms: Secondary | ICD-10-CM | POA: Diagnosis present

## 2020-12-18 DIAGNOSIS — Z01810 Encounter for preprocedural cardiovascular examination: Secondary | ICD-10-CM

## 2020-12-18 DIAGNOSIS — K5790 Diverticulosis of intestine, part unspecified, without perforation or abscess without bleeding: Secondary | ICD-10-CM | POA: Diagnosis present

## 2020-12-18 DIAGNOSIS — G4733 Obstructive sleep apnea (adult) (pediatric): Secondary | ICD-10-CM | POA: Diagnosis present

## 2020-12-18 DIAGNOSIS — I2510 Atherosclerotic heart disease of native coronary artery without angina pectoris: Secondary | ICD-10-CM | POA: Diagnosis present

## 2020-12-18 DIAGNOSIS — I6523 Occlusion and stenosis of bilateral carotid arteries: Secondary | ICD-10-CM | POA: Diagnosis present

## 2020-12-18 DIAGNOSIS — E785 Hyperlipidemia, unspecified: Secondary | ICD-10-CM | POA: Diagnosis present

## 2020-12-18 DIAGNOSIS — K5791 Diverticulosis of intestine, part unspecified, without perforation or abscess with bleeding: Secondary | ICD-10-CM | POA: Diagnosis not present

## 2020-12-18 DIAGNOSIS — H409 Unspecified glaucoma: Secondary | ICD-10-CM | POA: Diagnosis present

## 2020-12-18 DIAGNOSIS — Z8249 Family history of ischemic heart disease and other diseases of the circulatory system: Secondary | ICD-10-CM

## 2020-12-18 DIAGNOSIS — I714 Abdominal aortic aneurysm, without rupture, unspecified: Secondary | ICD-10-CM | POA: Diagnosis present

## 2020-12-18 DIAGNOSIS — K8080 Other cholelithiasis without obstruction: Secondary | ICD-10-CM

## 2020-12-18 DIAGNOSIS — Z87442 Personal history of urinary calculi: Secondary | ICD-10-CM

## 2020-12-18 DIAGNOSIS — Z23 Encounter for immunization: Secondary | ICD-10-CM | POA: Diagnosis not present

## 2020-12-18 DIAGNOSIS — R1011 Right upper quadrant pain: Secondary | ICD-10-CM | POA: Insufficient documentation

## 2020-12-18 DIAGNOSIS — D693 Immune thrombocytopenic purpura: Secondary | ICD-10-CM | POA: Diagnosis present

## 2020-12-18 DIAGNOSIS — K8000 Calculus of gallbladder with acute cholecystitis without obstruction: Principal | ICD-10-CM | POA: Diagnosis present

## 2020-12-18 DIAGNOSIS — I700 Atherosclerosis of aorta: Secondary | ICD-10-CM | POA: Diagnosis present

## 2020-12-18 DIAGNOSIS — I10 Essential (primary) hypertension: Secondary | ICD-10-CM | POA: Diagnosis present

## 2020-12-18 DIAGNOSIS — I739 Peripheral vascular disease, unspecified: Secondary | ICD-10-CM | POA: Diagnosis present

## 2020-12-18 DIAGNOSIS — Z8674 Personal history of sudden cardiac arrest: Secondary | ICD-10-CM | POA: Diagnosis not present

## 2020-12-18 DIAGNOSIS — Z87891 Personal history of nicotine dependence: Secondary | ICD-10-CM

## 2020-12-18 DIAGNOSIS — K8066 Calculus of gallbladder and bile duct with acute and chronic cholecystitis without obstruction: Secondary | ICD-10-CM | POA: Diagnosis not present

## 2020-12-18 DIAGNOSIS — K8020 Calculus of gallbladder without cholecystitis without obstruction: Secondary | ICD-10-CM | POA: Diagnosis not present

## 2020-12-18 DIAGNOSIS — K8012 Calculus of gallbladder with acute and chronic cholecystitis without obstruction: Secondary | ICD-10-CM | POA: Diagnosis not present

## 2020-12-18 DIAGNOSIS — Z972 Presence of dental prosthetic device (complete) (partial): Secondary | ICD-10-CM

## 2020-12-18 DIAGNOSIS — K8071 Calculus of gallbladder and bile duct without cholecystitis with obstruction: Secondary | ICD-10-CM | POA: Diagnosis not present

## 2020-12-18 DIAGNOSIS — Z419 Encounter for procedure for purposes other than remedying health state, unspecified: Secondary | ICD-10-CM

## 2020-12-18 LAB — CBC WITH DIFFERENTIAL/PLATELET
Abs Immature Granulocytes: 0.05 10*3/uL (ref 0.00–0.07)
Basophils Absolute: 0 10*3/uL (ref 0.0–0.1)
Basophils Relative: 0 %
Eosinophils Absolute: 0.1 10*3/uL (ref 0.0–0.5)
Eosinophils Relative: 1 %
HCT: 42.1 % (ref 39.0–52.0)
Hemoglobin: 13.8 g/dL (ref 13.0–17.0)
Immature Granulocytes: 1 %
Lymphocytes Relative: 13 %
Lymphs Abs: 1.4 10*3/uL (ref 0.7–4.0)
MCH: 31 pg (ref 26.0–34.0)
MCHC: 32.8 g/dL (ref 30.0–36.0)
MCV: 94.6 fL (ref 80.0–100.0)
Monocytes Absolute: 1.3 10*3/uL — ABNORMAL HIGH (ref 0.1–1.0)
Monocytes Relative: 12 %
Neutro Abs: 8 10*3/uL — ABNORMAL HIGH (ref 1.7–7.7)
Neutrophils Relative %: 73 %
Platelets: 111 10*3/uL — ABNORMAL LOW (ref 150–400)
RBC: 4.45 MIL/uL (ref 4.22–5.81)
RDW: 13.2 % (ref 11.5–15.5)
WBC: 10.9 10*3/uL — ABNORMAL HIGH (ref 4.0–10.5)
nRBC: 0 % (ref 0.0–0.2)

## 2020-12-18 LAB — COMPREHENSIVE METABOLIC PANEL
ALT: 17 U/L (ref 0–44)
AST: 23 U/L (ref 15–41)
Albumin: 4.7 g/dL (ref 3.5–5.0)
Alkaline Phosphatase: 50 U/L (ref 38–126)
Anion gap: 13 (ref 5–15)
BUN: 16 mg/dL (ref 8–23)
CO2: 24 mmol/L (ref 22–32)
Calcium: 9.7 mg/dL (ref 8.9–10.3)
Chloride: 99 mmol/L (ref 98–111)
Creatinine, Ser: 1.33 mg/dL — ABNORMAL HIGH (ref 0.61–1.24)
GFR, Estimated: 57 mL/min — ABNORMAL LOW (ref >60)
Glucose, Bld: 107 mg/dL — ABNORMAL HIGH (ref 70–99)
Potassium: 3.7 mmol/L (ref 3.5–5.1)
Sodium: 136 mmol/L (ref 135–145)
Total Bilirubin: 1.2 mg/dL (ref 0.3–1.2)
Total Protein: 8.3 g/dL — ABNORMAL HIGH (ref 6.5–8.1)

## 2020-12-18 LAB — RESP PANEL BY RT-PCR (FLU A&B, COVID) ARPGX2
Influenza A by PCR: NEGATIVE
Influenza B by PCR: NEGATIVE
SARS Coronavirus 2 by RT PCR: NEGATIVE

## 2020-12-18 LAB — LIPASE, BLOOD: Lipase: 47 U/L (ref 11–51)

## 2020-12-18 LAB — I-STAT BETA HCG BLOOD, ED (MC, WL, AP ONLY): I-stat hCG, quantitative: <5 m[IU]/mL (ref <5)

## 2020-12-18 MED ORDER — PANTOPRAZOLE SODIUM 40 MG PO TBEC
40.0000 mg | DELAYED_RELEASE_TABLET | Freq: Two times a day (BID) | ORAL | Status: DC
  Administered 2020-12-18 – 2020-12-20 (×3): 40 mg via ORAL
  Filled 2020-12-18 (×3): qty 1

## 2020-12-18 MED ORDER — SODIUM CHLORIDE 0.9 % IV SOLN: Freq: Once | INTRAVENOUS | Status: AC

## 2020-12-18 MED ORDER — METOPROLOL TARTRATE 5 MG/5ML IV SOLN: 5.0000 mg | Freq: Four times a day (QID) | INTRAVENOUS | Status: DC | PRN

## 2020-12-18 MED ORDER — HYDROCODONE-ACETAMINOPHEN 5-325 MG PO TABS: 1.0000 | ORAL_TABLET | ORAL | Status: DC | PRN

## 2020-12-18 MED ORDER — ATENOLOL 25 MG PO TABS
12.5000 mg | ORAL_TABLET | Freq: Two times a day (BID) | ORAL | Status: DC
  Administered 2020-12-18 – 2020-12-20 (×4): 12.5 mg via ORAL
  Filled 2020-12-18 (×4): qty 1

## 2020-12-18 MED ORDER — TRAMADOL HCL 50 MG PO TABS: 50.0000 mg | ORAL_TABLET | Freq: Four times a day (QID) | ORAL | 0 refills | Status: DC | PRN

## 2020-12-18 MED ORDER — AMLODIPINE BESYLATE 10 MG PO TABS: 10.0000 mg | ORAL_TABLET | Freq: Every day | ORAL | Status: DC

## 2020-12-18 MED ORDER — TRIAMCINOLONE ACETONIDE 0.1 % EX CREA
1.0000 "application " | TOPICAL_CREAM | Freq: Two times a day (BID) | CUTANEOUS | Status: DC | PRN
  Administered 2020-12-19 – 2020-12-20 (×2): 1 via TOPICAL
  Filled 2020-12-18: qty 15

## 2020-12-18 MED ORDER — HEPARIN SODIUM (PORCINE) 5000 UNIT/ML IJ SOLN
5000.0000 [IU] | Freq: Three times a day (TID) | INTRAMUSCULAR | Status: DC
  Administered 2020-12-19: 5000 [IU] via SUBCUTANEOUS
  Filled 2020-12-18 (×2): qty 1

## 2020-12-18 MED ORDER — LATANOPROST 0.005 % OP SOLN
1.0000 [drp] | Freq: Two times a day (BID) | OPHTHALMIC | Status: DC
  Administered 2020-12-18 – 2020-12-20 (×4): 1 [drp] via OPHTHALMIC
  Filled 2020-12-18: qty 2.5

## 2020-12-18 MED ORDER — TRIAMCINOLONE ACETONIDE 0.025 % EX CREA
TOPICAL_CREAM | Freq: Two times a day (BID) | CUTANEOUS | Status: DC
  Filled 2020-12-18: qty 15

## 2020-12-18 MED ORDER — ONDANSETRON HCL 4 MG PO TABS: 4.0000 mg | ORAL_TABLET | Freq: Three times a day (TID) | ORAL | 0 refills | Status: DC | PRN

## 2020-12-18 MED ORDER — ACETAMINOPHEN 325 MG PO TABS: 650.0000 mg | ORAL_TABLET | Freq: Four times a day (QID) | ORAL | Status: DC | PRN

## 2020-12-18 MED ORDER — SODIUM CHLORIDE 0.9 % IV SOLN: INTRAVENOUS | Status: DC

## 2020-12-18 MED ORDER — SODIUM CHLORIDE 0.9 % IV SOLN
2.0000 g | INTRAVENOUS | Status: DC
  Administered 2020-12-18: 2 g via INTRAVENOUS
  Filled 2020-12-18: qty 20

## 2020-12-18 NOTE — ED Triage Notes (Signed)
Patient was sent by PCP for surgery. States he had an [place] this morning and found gallstones. Reports RUQ pain and belching x 2 days.

## 2020-12-18 NOTE — ED Provider Notes (Signed)
Lonnie Hansen Provider Note   CSN: [id number] Arrival date & time: 12/18/20  1119     History Chief Complaint  Patient presents with   Cholelithiasis    Lonnie Hansen is a 70 y.o. male with a past medical history of AAA, carotid artery stenosis, chronic ITP, CAD, hyperlipidemia, hypertension, obstructive sleep apnea on CPAP who is status post aortic arch repair and AVR.  Patient was sent in by his primary care physician after ultrasound of the right upper quadrant revealed large gallstone and suspected acute cholecystitis.  Patient states that he has had problems with his gallbladder in the past but has not really had any issues for so long he forgot all about it.  Yesterday he woke up with right upper quadrant abdominal pain.  Pain was described as colicky, moderate in intensity.  He denies any nausea, vomiting.  It did not seem to be worsened or improved by anything.  He saw his PCP this morning who immediately sent him for an ultrasound and sent him here.  Patient states that he does not have any pain at this time.  He denies any nausea or vomiting.  He denies fevers, chills.  HPI     Past Medical History:  Diagnosis Date   AAA (abdominal aortic aneurysm)    Anemia    Arthritis    Asymptomatic bilateral carotid artery stenosis 08/2015   1-39%    Cataracts, bilateral    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   Coronary artery disease    Diverticulosis    Enlarged aorta (HCC)    Enlarged prostate    slightly   GERD (gastroesophageal reflux disease)    takes Pantoprazole daily as needed   Glaucoma    uses eye drops daily   Headache    History of colon polyps    benign   History of kidney stones    Hyperlipidemia    no on any meds   Hypertension    takes Amlodipine and Atenolol daily   OSA on CPAP    Vocal cord nodule    pt. states  it's a" growth on vocal cord"    Patient Active Problem List   Diagnosis Date  Noted   Hemorrhagic shock ([place])    AKI (acute kidney injury) ([place])    Cardiac arrest ([place])    GIB (gastrointestinal bleeding) 05/24/2020   GI bleed 04/15/2019   Rectal bleeding 04/14/2019   Acute blood loss anemia 04/14/2019   Acute GI bleeding 04/14/2019   Unilateral primary osteoarthritis, 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   Chronic ITP (idiopathic thrombocytopenia) ([place]) 03/31/2018   OSA on CPAP 03/04/2017   Vocal cord nodule    Sleep apnea    Nocturia    Joint swelling    Joint pain    Hypertension    History of kidney stones    History of colon polyps    Headache    Glaucoma    GERD (gastroesophageal reflux disease)    Enlarged prostate    Enlarged aorta (HCC)    Diverticulosis    Coronary artery disease    Cataracts, bilateral    Bruising    Blood dyscrasia    Arthritis    Anemia    Hydropneumothorax 10/29/2016   S/P AVR 09/15/2016   Coronary artery disease involving native coronary artery of native heart without angina pectoris 04/29/2016   Aortic insufficiency 04/01/2016   Thoracic aortic aneurysm 09/18/2015   HTN (hypertension) 09/18/2015  Hyperlipidemia 09/18/2015   Asymptomatic bilateral carotid artery stenosis 08/27/2015    Past Surgical History:  Procedure Laterality Date   AORTIC ARCH ANGIOGRAPHY N/A 04/16/2016   Procedure: Aortic Arch Angiography;  Surgeon: [name] Martinique, MD;  Location: [place] [facility];  Service: Cardiovascular;  Laterality: N/A;   AORTIC VALVE REPLACEMENT N/A 09/15/2016   Procedure: AORTIC VALVE REPLACEMENT (AVR);  Surgeon: Grace Isaac, MD;  Location: [place];  Service: Open Heart Surgery;  Laterality: N/A;  Using 3mm Edwards Perimount Magna Ease Aortic Bioprosthesis Valve   ASCENDING AORTIC ROOT REPLACEMENT N/A 09/15/2016   Procedure: ASCENDING AORTIC ROOT REPLACEMENT;  Surgeon: Grace Isaac, MD;  Location: [place];  Service: Open Heart Surgery;  Laterality: N/A;  Using 35mm Gelweave Valsalva Graft   COLONOSCOPY     COLONOSCOPY Left  04/16/2019   Procedure: COLONOSCOPY;  Surgeon: Arta Silence, MD;  Location: [facility] ENDOSCOPY;  Service: Endoscopy;  Laterality: Left;   COLONOSCOPY N/A 06/01/2020   Procedure: COLONOSCOPY;  Surgeon: Ronnette Juniper, MD;  Location: [place];  Service: Gastroenterology;  Laterality: N/A;   COLONOSCOPY WITH ESOPHAGOGASTRODUODENOSCOPY (EGD)     ESOPHAGOGASTRODUODENOSCOPY N/A 05/25/2020   Procedure: ESOPHAGOGASTRODUODENOSCOPY (EGD);  Surgeon: Wilford Corner, MD;  Location: [place];  Service: Endoscopy;  Laterality: N/A;   ESOPHAGOGASTRODUODENOSCOPY (EGD) WITH PROPOFOL Left 04/16/2019   Procedure: ESOPHAGOGASTRODUODENOSCOPY (EGD) WITH PROPOFOL;  Surgeon: Arta Silence, MD;  Location: [facility] ENDOSCOPY;  Service: Endoscopy;  Laterality: Left;   FLEXIBLE SIGMOIDOSCOPY N/A 05/25/2020   Procedure: FLEXIBLE SIGMOIDOSCOPY;  Surgeon: Wilford Corner, MD;  Location: [place];  Service: Endoscopy;  Laterality: N/A;   IR THORACENTESIS ASP PLEURAL SPACE W/IMG GUIDE  10/23/2016   MICROLARYNGOSCOPY Right 09/20/2017   Procedure: MICROLARYNGOSCOPY WITH  EXCISION OF VOCAL CORD LESION;  Surgeon: Leta Baptist, MD;  Location: [place];  Service: ENT;  Laterality: Right;   MULTIPLE EXTRACTIONS WITH ALVEOLOPLASTY N/A 06/10/2016   Procedure: Extraction of tooth #'s 2,8,13,15, and 29  with alveoloplasty, maxillary right and left buccal exostoses reductions, and gross debridement of remaining teeth.;  Surgeon: Lenn Cal, DDS;  Location: [place];  Service: Oral Surgery;  Laterality: N/A;   RIGHT/LEFT HEART CATH AND CORONARY ANGIOGRAPHY N/A 04/16/2016   Procedure: Right/Left Heart Cath and Coronary Angiography;  Surgeon: [name] Martinique, MD;  Location: [place] [facility];  Service: Cardiovascular;  Laterality: N/A;   TEE WITHOUT CARDIOVERSION N/A 09/15/2016   Procedure: TRANSESOPHAGEAL ECHOCARDIOGRAM (TEE);  Surgeon: Grace Isaac, MD;  Location: [place];  Service: Open Heart Surgery;  Laterality: N/A;        Family History  Problem Relation Age of Onset   Heart disease Father    Heart attack Father    Thyroid disease Sister     Social History   Tobacco Use   Smoking status: Former    Packs/day: 1.00    Years: 25.00    Pack years: 25.00    Types: Cigarettes   Smokeless tobacco: Never  Vaping Use   Vaping Use: Never used  Substance Use Topics   Alcohol use: No    Alcohol/week: 0.0 standard drinks   Drug use: No    Home Medications Prior to Admission medications   Medication Sig Start Date End Date Taking? Authorizing Provider  acetaminophen (TYLENOL) 325 MG tablet Take 2 tablets (650 mg total) by mouth every 6 (six) hours as needed for mild pain or fever. 05/29/20   Estill Cotta, NP  amLODipine (NORVASC) 10 MG tablet Take  10 mg by mouth daily at 12 noon.    [provider]  atenolol (TENORMIN) 25 MG tablet Take 1 tablet (25 mg total) by mouth 2 (two) times daily. 08/26/20   [name], MD  Ensure Plus (ENSURE PLUS) LIQD Take 237 mLs by mouth daily.    [provider]  ferrous sulfate 325 (65 FE) MG tablet Take 325 mg by mouth daily at 12 noon.    [provider]  latanoprost (XALATAN) 0.005 % ophthalmic solution Place 1 drop into both eyes 2 (two) times daily.    [provider]  losartan-hydrochlorothiazide (HYZAAR) 100-12.5 MG tablet Take 1 tablet by mouth daily. 07/08/20   [provider]  montelukast (SINGULAIR) 10 MG tablet Take 10 mg by mouth daily. 06/07/20   [provider]  Multiple Vitamin (MULTIVITAMIN WITH MINERALS) TABS tablet Take 1 tablet by mouth daily at 12 noon.    [provider]  pantoprazole (PROTONIX) 20 MG tablet Take 20 mg by mouth 2 (two) times daily.    [provider]  polyethylene glycol (MIRALAX / GLYCOLAX) 17 g packet Take 17 g by mouth daily. 06/04/20   [name], MD  psyllium (HYDROCIL/METAMUCIL) 95 % PACK Take 1 packet by mouth daily. 06/04/20   [name], MD   rosuvastatin (CRESTOR) 10 MG tablet Take 1 tablet by mouth once daily Patient taking differently: Take 10 mg by mouth daily at 12 noon. 05/17/20   Martinique, [name], MD  triamcinolone cream (KENALOG) 0.1 % Apply 1 application topically 2 (two) times daily as needed (facial itching/rash). 05/16/20   [provider]    Allergies    Patient has no known allergies.  Review of Systems   Review of Systems  Physical Exam Updated Vital Signs BP (!) 146/103 (BP Location: Left Arm)   Pulse 77   Temp 98.7 F (37.1 C)   Resp 15   Ht 5\' 8"  (1.727 m)   Wt 96.2 kg   SpO2 98%   BMI 32.23 kg/m   Physical Exam Vitals and nursing note reviewed.  Constitutional:      General: He is not in acute distress.    Appearance: He is well-developed. He is not diaphoretic.  HENT:     Head: Normocephalic and atraumatic.  Eyes:     General: No scleral icterus.    Conjunctiva/sclera: Conjunctivae normal.  Cardiovascular:     Rate and Rhythm: Normal rate and regular rhythm.     Heart sounds: Normal heart sounds.  Pulmonary:     Effort: Pulmonary effort is normal. No respiratory distress.     Breath sounds: Normal breath sounds.  Abdominal:     Palpations: Abdomen is soft.     Tenderness: There is abdominal tenderness in the right upper quadrant.  Musculoskeletal:     Cervical back: Normal range of motion and neck supple.  Skin:    General: Skin is warm and dry.  Neurological:     Mental Status: He is alert.  Psychiatric:        Behavior: Behavior normal.    ED Results / Procedures / Treatments   Labs (all labs ordered are listed, but only abnormal results are displayed) Labs Reviewed  COMPREHENSIVE METABOLIC PANEL  LIPASE, BLOOD  CBC WITH DIFFERENTIAL/PLATELET  I-STAT BETA HCG BLOOD, ED ([facility], [facility], [facility] ONLY)    EKG None  Radiology US Abdomen Limited RUQ (LIVER/GB)  Result Date: 12/18/2020 CLINICAL DATA:  Right upper quadrant pain EXAM: ULTRASOUND ABDOMEN  LIMITED RIGHT UPPER  QUADRANT COMPARISON:  None. FINDINGS: Gallbladder: Cholelithiasis, largest stone measures up to 3.1 cm. Mild gallbladder wall thickening. Positive sonographic Murphy sign noted by sonographer. Common bile duct: Diameter: 6.9 mm Liver: No focal lesion identified. Within normal limits in parenchymal echogenicity. Portal vein is patent on color Doppler imaging with normal direction of blood flow towards the liver. Other: Incidental note is made of a simple cyst of the mid right kidney measuring 4.9 x 4.7 x 4.6 cm. IMPRESSION: Mild gallbladder wall thickening with large gallstone and positive sonographic Murphy sign noted by sonographer, findings are concerning for acute cholecystitis. These results will be called to the ordering clinician or representative by the Radiologist Assistant, and communication documented in the PACS or [employer]. Electronically Signed   By: Yetta Glassman M.D.   On: 12/18/2020 10:46    Procedures Procedures   Medications Ordered in ED Medications - No data to display  ED Course  I have reviewed the triage vital signs and the nursing notes.  Pertinent labs & imaging results that were available during my care of the patient were reviewed by me and considered in my medical decision making (see chart for details).    MDM Rules/Calculators/A&P                         70 y/o Male send here due to RUQ abd pain, now resolved with positive OP [place] for acute cholecystitis.  I ordered and reviewed labs that show CBC with only mildly elevated white blood cell count of insignificant value, CMP with mild hypocalcemia also of insignificant value, negative respiratory panel.   Case discussed with PA [place] of surgery who will come down and evaluate the patient.  Signout given to PA Rona Ravens at shift handoff.  Final Clinical Impression(s) / ED Diagnoses Final diagnoses:  None    Rx / DC Orders ED Discharge Orders     None        Margarita Mail, PA-C 12/23/20 1215     Lajean Saver, MD 12/30/20 1007

## 2020-12-18 NOTE — Discharge Instructions (Addendum)
[facility] LAP[facility]: POST OP INSTRUCTIONS Always review your discharge instruction sheet given to you by the facility where your surgery was performed. IF YOU HAVE DISABILITY OR FAMILY LEAVE FORMS, YOU MUST BRING THEM TO THE OFFICE FOR PROCESSING.   DO NOT GIVE THEM TO YOUR DOCTOR.  PAIN CONTROL  First take acetaminophen (Tylenol) AND/or ibuprofen (Advil) to control your pain after surgery.  Follow directions on package.  Taking acetaminophen (Tylenol) and/or ibuprofen (Advil) regularly after surgery will help to control your pain and lower the amount of prescription pain medication you may need.  You should not take more than 3,000 mg (3 grams) of acetaminophen (Tylenol) in 24 hours.  You should not take ibuprofen (Advil), aleve, motrin, naprosyn or other NSAIDS if you have a history of stomach ulcers or chronic kidney disease.  A prescription for pain medication may be given to you upon discharge.  Take your pain medication as prescribed, if you still have uncontrolled pain after taking acetaminophen (Tylenol) or ibuprofen (Advil). Use ice packs to help control pain. If you need a refill on your pain medication, please contact your pharmacy.  They will contact our office to request authorization. Prescriptions will not be filled after 5pm or on week-ends.  HOME MEDICATIONS Take your usually prescribed medications unless otherwise directed.  DIET You should follow a light diet the first few days after arrival home.  Be sure to include lots of fluids daily. Avoid fatty, fried foods.   CONSTIPATION It is common to experience some constipation after surgery and if you are taking pain medication.  Increasing fluid intake and taking a stool softener (such as Colace) will usually help or prevent this problem from occurring.  A mild laxative (Milk of Magnesia or Miralax) should be taken according to package instructions if there are no bowel movements after 48  hours.  WOUND/INCISION CARE Most patients will experience some swelling and bruising in the area of the incisions.  Ice packs will help.  Swelling and bruising can take several days to resolve.  Unless discharge instructions indicate otherwise, follow guidelines below  STERI-STRIPS - you may remove your outer bandages 48 hours after surgery, and you may shower at that time.  You have steri-strips (small skin tapes) in place directly over the incision.  These strips should be left on the skin for 7-10 days.   DERMABOND/SKIN GLUE - you may shower in 24 hours.  The glue will flake off over the next 2-3 weeks. Any sutures or staples will be removed at the office during your follow-up visit. You may have some bloody drainage from your incisions at times - cover with dry gauze and change as needed  ACTIVITIES You may resume regular (light) daily activities beginning the next day--such as daily self-care, walking, climbing stairs--gradually increasing activities as tolerated.  You may have sexual intercourse when it is comfortable.  Refrain from any heavy lifting or straining until approved by your doctor. You may drive when you are no longer taking prescription pain medication, you can comfortably wear a seatbelt, and you can safely maneuver your car and apply brakes.  FOLLOW-UP You should see your doctor in the office for a follow-up appointment approximately 2-3 weeks after your surgery.  You should have been given your post-op/follow-up appointment when your surgery was scheduled.  If you did not receive a post-op/follow-up appointment, make sure that you call for this appointment within a day or two after you arrive home to insure a  convenient appointment time.  OTHER INSTRUCTIONS   WHEN TO CALL YOUR DOCTOR: Fever over 101.0 Inability to urinate Continued bleeding from incision. Increased pain, redness, or drainage from the incision. Increasing abdominal pain  The clinic staff is available  to answer your questions during regular business hours.  Please don't hesitate to call and ask to speak to one of the nurses for clinical concerns.  If you have a medical emergency, go to the nearest emergency room or call 911.  A surgeon from [facility] is always on call at the hospital. [address], [place], [place], [place]  [address] ? [address], [place],    [phone] ? [phone] ? FAX [phone] Web site: [email or web address]  Contact a health care provider if: Your pain lasts more than 5 hours. You vomit. You have a fever and chills. Your pain gets worse. Get help right away if: Your skin or the whites of your eyes look yellow (jaundice). Your have tea-colored urine and light-colored stools (feces). You are dizzy or you faint.

## 2020-12-18 NOTE — H&P (Signed)
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, AAA status postrepair with AVR 2019 follows with cardiothoracic surgery Recent admission 4/29 through 05/29/2020 hemorrhagic shock from diverticular bleed with readmission 5/6 through 06/03/2020 Prior admission in 2021 for diverticulosis, aortic root repair with tissue valve carotid stenosis Last EF 65-70% 09/16/2020  Started having belching and abdominal pain 2 days ago in the right upper quadrant decided to stop solid food and had moderate intensity pain--he has not had really any nausea vomiting-he has been lately eating more sausage and fatty foods and splurging over the past summer Outpatient ultrasound was performed at outpatient PCP office which showed cholecystitis  Lab work-up on admit revealed BUN/creatinine 18/1.1 >>60/1.3 bili 1.2 LFTs normal lipase 47 White count 10.9  Review of Systems:    Pertinent +'s: Abdominal pain nausea  Pertinent -"s:   [-] Dark stool tarry stool unilateral weakness blurred vision double vision cough cold fever chills dysuria constipation seizure sore throat rash  ED Course: Work-up performed given IV fluid given 1 dose of Rocephin COVID pending started on clear liquid diet by general surgery   Past Medical History:  Diagnosis Date   AAA (abdominal aortic aneurysm)    Anemia    Arthritis    Asymptomatic bilateral carotid artery stenosis 08/2015   1-39%    Cataracts, bilateral    Chronic ITP (idiopathic thrombocytopenia) ([place]) 03/31/2018   Coronary artery disease    Diverticulosis    Enlarged aorta (HCC)    Enlarged prostate    slightly   GERD (gastroesophageal reflux disease)    takes Pantoprazole daily as needed   Glaucoma    uses eye drops daily   Headache    History of colon polyps    benign   History of  kidney stones    Hyperlipidemia    no on any meds   Hypertension    takes Amlodipine and Atenolol daily   OSA on CPAP    Vocal cord nodule    pt. states  it's a" growth on vocal cord"   Past Surgical History:  Procedure Laterality Date   AORTIC ARCH ANGIOGRAPHY N/A 04/16/2016   Procedure: Aortic Arch Angiography;  Surgeon: [name] Martinique, MD;  Location: [place] [facility];  Service: Cardiovascular;  Laterality: N/A;   AORTIC VALVE REPLACEMENT N/A 09/15/2016   Procedure: AORTIC VALVE REPLACEMENT (AVR);  Surgeon: Grace Isaac, MD;  Location: [place];  Service: Open Heart Surgery;  Laterality: N/A;  Using 77mm Edwards Perimount Magna Ease Aortic Bioprosthesis Valve   ASCENDING AORTIC ROOT REPLACEMENT N/A 09/15/2016   Procedure: ASCENDING AORTIC ROOT REPLACEMENT;  Surgeon: Grace Isaac, MD;  Location: [place];  Service: Open Heart Surgery;  Laterality: N/A;  Using 68mm Gelweave Valsalva Graft   COLONOSCOPY     COLONOSCOPY Left 04/16/2019   Procedure: COLONOSCOPY;  Surgeon: Arta Silence, MD;  Location: [facility] ENDOSCOPY;  Service: Endoscopy;  Laterality: Left;   COLONOSCOPY N/A 06/01/2020   Procedure: COLONOSCOPY;  Surgeon: Ronnette Juniper, MD;  Location: [place];  Service: Gastroenterology;  Laterality: N/A;   COLONOSCOPY WITH ESOPHAGOGASTRODUODENOSCOPY (EGD)     ESOPHAGOGASTRODUODENOSCOPY N/A 05/25/2020   Procedure: ESOPHAGOGASTRODUODENOSCOPY (EGD);  Surgeon: Wilford Corner, MD;  Location: [place];  Service: Endoscopy;  Laterality: N/A;   ESOPHAGOGASTRODUODENOSCOPY (EGD) WITH PROPOFOL Left 04/16/2019  Procedure: ESOPHAGOGASTRODUODENOSCOPY (EGD) WITH PROPOFOL;  Surgeon: Arta Silence, MD;  [facility] ENDOSCOPY;  Service: Endoscopy;  Laterality: Left;   FLEXIBLE SIGMOIDOSCOPY N/A 05/25/2020   Procedure: FLEXIBLE SIGMOIDOSCOPY;  Surgeon: Wilford Corner, MD;  Location: [place];  Service: Endoscopy;  Laterality: N/A;   IR THORACENTESIS ASP PLEURAL SPACE W/IMG GUIDE  10/23/2016    MICROLARYNGOSCOPY Right 09/20/2017   Procedure: MICROLARYNGOSCOPY WITH  EXCISION OF VOCAL CORD LESION;  Surgeon: Leta Baptist, MD;  Location: [place];  Service: ENT;  Laterality: Right;   MULTIPLE EXTRACTIONS WITH ALVEOLOPLASTY N/A 06/10/2016   Procedure: Extraction of tooth #'s 2,8,13,15, and 29  with alveoloplasty, maxillary right and left buccal exostoses reductions, and gross debridement of remaining teeth.;  Surgeon: Lenn Cal, DDS;  Location: [place];  Service: Oral Surgery;  Laterality: N/A;   RIGHT/LEFT HEART CATH AND CORONARY ANGIOGRAPHY N/A 04/16/2016   Procedure: Right/Left Heart Cath and Coronary Angiography;  Surgeon: [name] Martinique, MD;  Location: [place] CV LAB;  Service: Cardiovascular;  Laterality: N/A;   TEE WITHOUT CARDIOVERSION N/A 09/15/2016   Procedure: TRANSESOPHAGEAL ECHOCARDIOGRAM (TEE);  Surgeon: Grace Isaac, MD;  Location: [place];  Service: Open Heart Surgery;  Laterality: N/A;    reports that he has quit smoking. His smoking use included cigarettes. He has a 25.00 pack-year smoking history. He has never used smokeless tobacco. He reports that he does not drink alcohol and does not use drugs.  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 Authorizing Provider  ondansetron (ZOFRAN) 4 MG tablet Take 1 tablet (4 mg total) by mouth every 8 (eight) hours as needed for nausea or vomiting. 12/18/20  Yes Harris, Abigail, PA-C  traMADol (ULTRAM) 50 MG tablet Take 1 tablet (50 mg total) by mouth every 6 (six) hours as needed. 12/18/20  Yes Margarita Mail, PA-C  acetaminophen (TYLENOL) 325 MG tablet Take 2 tablets (650 mg total) by mouth every 6 (six) hours as needed for mild pain or fever.  05/29/20   Estill Cotta, NP  amLODipine (NORVASC) 10 MG tablet Take 10 mg by mouth daily at 12 noon.    [provider]  atenolol (TENORMIN) 25 MG tablet Take 1 tablet (25 mg total) by mouth 2 (two) times daily. 08/26/20   [name], MD  Ensure Plus (ENSURE PLUS) LIQD Take 237 mLs by mouth daily.    [provider]  ferrous sulfate 325 (65 FE) MG tablet Take 325 mg by mouth daily at 12 noon.    [provider]  latanoprost (XALATAN) 0.005 % ophthalmic solution Place 1 drop into both eyes 2 (two) times daily.    [provider]  losartan-hydrochlorothiazide (HYZAAR) 100-12.5 MG tablet Take 1 tablet by mouth daily. 07/08/20   [provider]  montelukast (SINGULAIR) 10 MG tablet Take 10 mg by mouth daily. 06/07/20   [provider]  Multiple Vitamin (MULTIVITAMIN WITH MINERALS) TABS tablet Take 1 tablet by mouth daily at 12 noon.    [provider]  pantoprazole (PROTONIX) 20 MG tablet Take 20 mg by mouth 2 (two) times daily.    [provider]  polyethylene glycol (MIRALAX / GLYCOLAX) 17 g packet Take 17 g  by mouth daily. 06/04/20   [name], MD  psyllium (HYDROCIL/METAMUCIL) 95 % PACK Take 1 packet by mouth daily. 06/04/20   [name], MD  rosuvastatin (CRESTOR) 10 MG tablet Take 1 tablet by mouth once daily Patient taking differently: Take 10 mg by mouth daily at 12 noon. 05/17/20   Martinique, [name], MD  triamcinolone cream (KENALOG) 0.1 % Apply 1 application topically 2 (two) times daily as needed (facial itching/rash). 05/16/20   [provider]    Physical Exam:  Vitals:   12/18/20 1530 12/18/20 1600  BP: 138/86 (!) 142/82  Pulse: 61   Resp: (!) 22 19  Temp:    SpO2: 99% 100%    Alert pleasant black male no distress very pleasant No thyromegaly EOMI NCAT no distress neck soft supple no JVD S1-S2 no murmur no rub no gallop on monitors sinus Abdomen soft slight right upper quadrant discomfort  on deep palpation--cannot appreciate spleen No lower extremity edema no rash Chest clear no rales no rhonchi Power 5/5 moving all 4 limbs equally full neuro assessment deferred Psych euthymic congruent  I have personally reviewed following labs and imaging studies  Labs:  As above  Imaging studies:  None--OP US done   Medical tests:  EKG independently reviewed: PR interval 0.20 QRS axis X degrees no ST-T wave changes across precordium  Test discussed with performing physician: Yes discussed with the ED PA Mr. Rona Ravens  Decision to obtain old records:  Yes  Review and summation of old records:  Yes  Active Problems:   * No active hospital problems. *   Assessment/Plan Acute cholecystitis Patient moderate risk for surgery but does need to be done given this is acute Switch from Rocephin to Unasyn for intra-abdominal coverage Placed on fluids 50 cc/H and allow liquid diet as per surgery Appreciate cardiology clearance in advance Mild AKI on admission Likely in the setting of poor p.o. intake in addition to continued antihypertensive use Have discontinued Hyzaar at this time Labs a.m. HTN Continue amlodipine 10, atenolol 12.5 twice daily 4.6 cm AAA Aortic root repair last echo showed normal EF AV--prosthesis functioning normally with EF 65 to 70% Follow-up with cardiovascular surgery in the outpatient setting Chronic ITP Patient is on Nplate in the outpatient setting and follows with Dr. Standley Brooking will add him to the treatment team His platelets if they are above 50 should not be a preclusion for surgery--defer to them Recurrent GI bleeds significant hospitalizations 04/2020 with shock Resume Protonix    Severity of Illness: The appropriate patient status for this patient is INPATIENT. Inpatient status is judged to be reasonable and necessary in order to provide the required intensity of service to ensure the patient's safety. The patient's presenting symptoms, physical  exam findings, and initial radiographic and laboratory data in the context of their chronic comorbidities is felt to place them at high risk for further clinical deterioration. Furthermore, it is not anticipated that the patient will be medically stable for discharge from the hospital within 2 midnights of admission.   * I certify that at the point of admission it is my clinical judgment that the patient will require inpatient hospital care spanning beyond 2 midnights from the point of admission due to high intensity of service, high risk for further deterioration and high frequency of surveillance required.*   DVT prophylaxis: SCD Code Status: Full Family Communication: None Consults called:  General surgery has seen cardiology will see  Time spent: 24 minutes  Kailany Dinunzio, MD [  days-call my NP partners at night for Care related issues] [facility] --[facility] [employer] [facility] , [email or web address]; password [facility]  12/18/2020, 4:36 PM

## 2020-12-18 NOTE — ED Provider Notes (Signed)
Received signout at beginning of shift.  Please see previous providers notes for complete H&P.  This is a 70 year old male who was sent here by PCP after his right upper quadrant ultrasound demonstrates large gallstones with suspected acute cholecystitis.  Patient has had recurrent right upper quadrant abdominal pain.  He denies any active pain at this time, labs are reassuring, general medicine was consulted and has seen and evaluated patient.  They felt patient would benefit from cholecystectomy but requests medicine for admission with plan for cholecystectomy tomorrow.  4:45 PM Appreciate consultation from [facility] Dr. Verlon Au who agrees to see and will admit pt.    BP (!) 142/82   Pulse 61   Temp 98.7 F (37.1 C)   Resp 19   Ht 5\' 8"  (1.727 m)   Wt 96.2 kg   SpO2 100%   BMI 32.23 kg/m   Results for orders placed or performed during the hospital encounter of 12/18/20  Comprehensive metabolic panel  Result Value Ref Range   Sodium 136 135 - 145 mmol/L   Potassium 3.7 3.5 - 5.1 mmol/L   Chloride 99 98 - 111 mmol/L   CO2 24 22 - 32 mmol/L   Glucose, Bld 107 (H) 70 - 99 mg/dL   BUN 16 8 - 23 mg/dL   Creatinine, Ser 1.33 (H) 0.61 - 1.24 mg/dL   Calcium 9.7 8.9 - 10.3 mg/dL   Total Protein 8.3 (H) 6.5 - 8.1 g/dL   Albumin 4.7 3.5 - 5.0 g/dL   AST 23 15 - 41 U/L   ALT 17 0 - 44 U/L   Alkaline Phosphatase 50 38 - 126 U/L   Total Bilirubin 1.2 0.3 - 1.2 mg/dL   GFR, Estimated 57 (L) >60 mL/min   Anion gap 13 5 - 15  Lipase, blood  Result Value Ref Range   Lipase 47 11 - 51 U/L  CBC with Differential  Result Value Ref Range   WBC 10.9 (H) 4.0 - 10.5 K/uL   RBC 4.45 4.22 - 5.81 MIL/uL   Hemoglobin 13.8 13.0 - 17.0 g/dL   HCT 42.1 39.0 - 52.0 %   MCV 94.6 80.0 - 100.0 fL   MCH 31.0 26.0 - 34.0 pg   MCHC 32.8 30.0 - 36.0 g/dL   RDW 13.2 11.5 - 15.5 %   Platelets 111 (L) 150 - 400 K/uL   nRBC 0.0 0.0 - 0.2 %   Neutrophils Relative % 73 %   Neutro Abs 8.0 (H) 1.7 - 7.7  K/uL   Lymphocytes Relative 13 %   Lymphs Abs 1.4 0.7 - 4.0 K/uL   Monocytes Relative 12 %   Monocytes Absolute 1.3 (H) 0.1 - 1.0 K/uL   Eosinophils Relative 1 %   Eosinophils Absolute 0.1 0.0 - 0.5 K/uL   Basophils Relative 0 %   Basophils Absolute 0.0 0.0 - 0.1 K/uL   Immature Granulocytes 1 %   Abs Immature Granulocytes 0.05 0.00 - 0.07 K/uL  I-Stat beta hCG blood, ED ([facility], [facility], [facility] only)  Result Value Ref Range   I-stat hCG, quantitative <5.0 <5 mIU/mL   Comment 3           MR FOOT LEFT WO CONTRAST  Result Date: 12/11/2020 CLINICAL DATA:  Foot pain, chronic, plantar fasciitis suspected EXAM: MRI OF THE LEFT FOOT WITHOUT CONTRAST TECHNIQUE: Multiplanar, multisequence MR imaging of the left foot was performed. No intravenous contrast was administered. COMPARISON:  Left foot radiograph 11/25/2020 FINDINGS: Bones/Joint/Cartilage There is  hallux valgus with mild first MTP degenerative change. There is no evidence of acute fracture. Multiple hammertoes. Ligaments Intact Lisfranc ligament. Intact lateral ankle ligaments. Intact high ankle ligaments. Intact medial ankle ligaments. Muscles and Tendons There is mild intermetatarsal muscle atrophy and edema. There is atrophy of the abductor digiti minimi muscle. Peroneal tendons, extensor tendons common flexor tendons of the ankle are intact. Soft tissues Along the distal central plantar fascia, there is a 3.2 x 1.4 x 4.3 cm T1 isointense/heterogeneous T2 mass corresponding to the palpable marker (series 8, image 18, series 4, image 12). There is another similar mass along the medial forefoot measuring 2.0 x 1.3 x 1.3 cm which abuts the great toe flexor tendon (series 8 image 8, series 4, image 23). IMPRESSION: Multiple T1 isointense/heterogeneous T2 signal masses, one along the distal central plantar fascia and another plantar to the first metatarsal abutting the great toe flexor tendon, measurements above. 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 Electronically Signed   By: Maurine Simmering M.D.   On: 12/11/2020 14:39   DG Foot Complete Left  Result Date: 11/25/2020 Please see detailed radiograph report in office note.  DG Foot Complete Right  Result Date: 11/25/2020 Please see detailed radiograph report in office note.  CT ANGIO CHEST AORTA W/CM & OR WO/CM  Result Date: 12/11/2020 CLINICAL DATA:  Thoracic aortic aneurysm follow-up. EXAM: CT ANGIOGRAPHY CHEST WITH CONTRAST TECHNIQUE: Multidetector CT imaging of the chest was performed using the standard protocol during bolus administration of intravenous contrast. Multiplanar CT image reconstructions and MIPs were obtained to evaluate the vascular anatomy. CONTRAST:  65mL ISOVUE-370 IOPAMIDOL (ISOVUE-370) INJECTION 76% COMPARISON:  CTA chest dated November 30, 2019. FINDINGS: Cardiovascular: Prior ascending thoracic aorta repair. Unchanged aneurysmal dilatation of the aortic arch measuring up to 4.6 cm (when re-measured in the same axial plane on series 5, image 44). No dissection. Normal heart size. Prior AVR. No pericardial effusion. No pulmonary embolism. Mediastinum/Nodes: No enlarged mediastinal, hilar, or axillary lymph nodes. Thyroid gland, trachea, and esophagus demonstrate no significant findings. Lungs/Pleura: No focal consolidation, pleural effusion, or pneumothorax. Upper Abdomen: No acute abnormality. Unchanged gallstones, bilateral renal cysts, and dystrophic calcification in the upper pole of the left kidney. Musculoskeletal: No chest wall abnormality. No acute or significant osseous findings. Review of the MIP images confirms the above findings. IMPRESSION: 1. Prior ascending thoracic aorta repair with unchanged aneurysmal dilatation of the aortic arch measuring up to 4.6 cm. Ascending thoracic aortic aneurysm. Recommend semi-annual imaging followup by CTA or MRA and referral to cardiothoracic surgery if not already obtained. This  recommendation follows 2010 ACCF/AHA/AATS/ACR/ASA/SCA/SCAI/SIR/STS/SVM Guidelines for the Diagnosis and Management of Patients With Thoracic Aortic Disease. Circulation. 2010; 121: M094-B096. Aortic aneurysm NOS (ICD10-I71.9) Electronically Signed   By: Titus Dubin M.D.   On: 12/11/2020 14:30   US Abdomen Limited RUQ (LIVER/GB)  Result Date: 12/18/2020 CLINICAL DATA:  Right upper quadrant pain EXAM: ULTRASOUND ABDOMEN LIMITED RIGHT UPPER QUADRANT COMPARISON:  None. FINDINGS: Gallbladder: Cholelithiasis, largest stone measures up to 3.1 cm. Mild gallbladder wall thickening. Positive sonographic Murphy sign noted by sonographer. Common bile duct: Diameter: 6.9 mm Liver: No focal lesion identified. Within normal limits in parenchymal echogenicity. Portal vein is patent on color Doppler imaging with normal direction of blood flow towards the liver. Other: Incidental note is made of a simple cyst of the mid right kidney measuring 4.9 x 4.7 x 4.6 cm. IMPRESSION: Mild gallbladder wall thickening with large  gallstone and positive sonographic Murphy sign noted by sonographer, findings are concerning for acute cholecystitis. These results will be called to the ordering clinician or representative by the Radiologist Assistant, and communication documented in the PACS or [employer]. Electronically Signed   By: Yetta Glassman M.D.   On: 12/18/2020 10:46        Domenic Moras, PA-C 12/18/20 1645    Dorie Rank, MD 12/22/20 [phone]

## 2020-12-18 NOTE — Consult Note (Signed)
Consult Note  Lonnie Hansen Mar 24, 1950  [id number].    Requesting MD: Margarita Mail PA-C Chief Complaint/Reason for Consult: Cholelithiasis HPI:  Patient is a 70 year old male who presented to [facility] at the direction of PCP after outpatient RUQ [place] this morning. Patient reports that he started having mild, nagging RUQ discomfort 3 days ago. As a response he decided to fast from solid food and see if that helped. Yesterday morning, he awoke with this pain again, saying it caused him to toss and turn all night. It was constant and moderate intensity, with intermittent exacerbations. He denied nausea or vomiting. Nothing improved or worsened pain. Denied fever,endorsed chills. Patient has known cholelithiasis and has had problems from this in the past but it had been a long time since he had any symptoms. When he was initially diagnosed with gallstones he cut back on fatty, greasy foods but states he has been on a greasy food kick lately. Pain currently only happens with deep palpation.   PMH significant for AAA, carotid artery stenosis, Chronic ITP, CAD, HLD, HTN, BPH, OSA on CPAP, and s/p aortic arch repair with AVR. No prior abdominal surgery. Not on any blood thinning agents. Followed by [name] for ITP. Cardiologist is Dr. Martinique. Dr. Oneida Alar was his vascular surgeon and Dr. Servando Snare did his aortic arch/AVR in 2019. 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 States he goes to the [facility] daily and does cardio and 10 lb hand weights without exertional chest pain or dyspnea. Denies tobacco or alcohol use.   ROS: Review of Systems  Constitutional:  Positive for chills.  Gastrointestinal:  Positive for abdominal pain.  Psychiatric/Behavioral:  The patient is nervous/anxious.   All other systems reviewed and are negative.  Family History  Problem Relation Age of Onset   Heart disease Father    Heart attack Father    Thyroid disease Sister     Past Medical History:  Diagnosis Date    AAA (abdominal aortic aneurysm)    Anemia    Arthritis    Asymptomatic bilateral carotid artery stenosis 08/2015   1-39%    Cataracts, bilateral    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   Coronary artery disease    Diverticulosis    Enlarged aorta (HCC)    Enlarged prostate    slightly   GERD (gastroesophageal reflux disease)    takes Pantoprazole daily as needed   Glaucoma    uses eye drops daily   Headache    History of colon polyps    benign   History of kidney stones    Hyperlipidemia    no on any meds   Hypertension    takes Amlodipine and Atenolol daily   OSA on CPAP    Vocal cord nodule    pt. states  it's a" growth on vocal cord"    Past Surgical History:  Procedure Laterality Date   AORTIC ARCH ANGIOGRAPHY N/A 04/16/2016   Procedure: Aortic Arch Angiography;  Surgeon: [name] Martinique, MD;  Location: [place];  Service: Cardiovascular;  Laterality: N/A;   AORTIC VALVE REPLACEMENT N/A 09/15/2016   Procedure: AORTIC VALVE REPLACEMENT (AVR);  Surgeon: Grace Isaac, MD;  Location: [place];  Service: Open Heart Surgery;  Laterality: N/A;  Using 18mm Edwards Perimount Magna Ease Aortic Bioprosthesis Valve   ASCENDING AORTIC ROOT REPLACEMENT N/A 09/15/2016   Procedure: ASCENDING AORTIC ROOT REPLACEMENT;  Surgeon: Grace Isaac, MD;  Location:  [facility];  Service: Open Heart Surgery;  Laterality: N/A;  Using 24mm Gelweave Valsalva Graft   COLONOSCOPY     COLONOSCOPY Left 04/16/2019   Procedure: COLONOSCOPY;  Surgeon: Arta Silence, MD;  Location: [facility] END[facility]COPY;  Service: Endoscopy;  Laterality: Left;   COLONOSCOPY N/A 06/01/2020   Procedure: COLONOSCOPY;  Surgeon: Ronnette Juniper, MD;  Location: [place];  Service: Gastroenterology;  Laterality: N/A;   COLONOSCOPY WITH ESOPHAGOGASTRODUODENOSCOPY (EGD)     ESOPHAGOGASTRODUODENOSCOPY N/A 05/25/2020   Procedure: ESOPHAGOGASTRODUODENOSCOPY (EGD);  Surgeon: Wilford Corner, MD;  Location: [place];  Service: Endoscopy;  Laterality: N/A;   ESOPHAGOGASTRODUODENOSCOPY (EGD) WITH PROPOFOL Left 04/16/2019   Procedure: ESOPHAGOGASTRODUODENOSCOPY (EGD) WITH PROPOFOL;  Surgeon: Arta Silence, MD;  Location: [facility] ENDOSCOPY;  Service: Endoscopy;  Laterality: Left;   FLEXIBLE SIGMOIDOSCOPY N/A 05/25/2020   Procedure: FLEXIBLE SIGMOIDOSCOPY;  Surgeon: Wilford Corner, MD;  Location: [place];  Service: Endoscopy;  Laterality: N/A;   IR THORACENTESIS ASP PLEURAL SPACE W/IMG GUIDE  10/23/2016   MICROLARYNGOSCOPY Right 09/20/2017   Procedure: MICROLARYNGOSCOPY WITH  EXCISION OF VOCAL CORD LESION;  Surgeon: Leta Baptist, MD;  Location: [place];  Service: ENT;  Laterality: Right;   MULTIPLE EXTRACTIONS WITH ALVEOLOPLASTY N/A 06/10/2016   Procedure: Extraction of tooth #'s 2,8,13,15, and 29  with alveoloplasty, maxillary right and left buccal exostoses reductions, and gross debridement of remaining teeth.;  Surgeon: Lenn Cal, DDS;  Location: [place];  Service: Oral Surgery;  Laterality: N/A;   RIGHT/LEFT HEART CATH AND CORONARY ANGIOGRAPHY N/A 04/16/2016   Procedure: Right/Left Heart Cath and Coronary Angiography;  Surgeon: [name] Martinique, MD;  Location: [place] [facility];  Service: Cardiovascular;  Laterality: N/A;   TEE WITHOUT CARDIOVERSION N/A 09/15/2016   Procedure: TRANSESOPHAGEAL ECHOCARDIOGRAM (TEE);  Surgeon: Grace Isaac, MD;  Location: [place];  Service: Open Heart Surgery;  Laterality: N/A;    Social History:  reports that he has quit smoking. His smoking use included cigarettes. He has a 25.00 pack-year smoking history. He has never used smokeless tobacco. He reports that he does not drink alcohol and does not use drugs.  Allergies: No Known Allergies  (Not in a hospital admission)   Blood pressure 130/77, pulse (!) 56, temperature 98.7 F (37.1 C), resp. rate 13, height 5\' 8"  (1.727 m), weight 96.2 kg, SpO2 98 %. Physical Exam:  General: pleasant, WD,   male who is laying in bed in NAD HEENT: head is normocephalic, atraumatic.  Sclera are noninjected.  PERRL.  Ears and nose without any masses or lesions.  Mouth is pink and moist Heart: regular, rate, and rhythm. No obvious murmurs, gallops, or rubs noted.  Palpable radial and pedal pulses bilaterally, no lower extremity edema Lungs: median sternotomy scar appears well-healing, CTAB, no wheezes, rhonchi, or rales noted.  Respiratory effort nonlabored Abd: soft, mild TTP RUQ and right upper flank without guarding or peritonitis, negative murphy's, ND, +BS, no masses, hernias, or organomegaly MS: all 4 extremities are symmetrical with no cyanosis, clubbing, or edema. Skin: warm and dry with no masses, lesions, or rashes Neuro: Cranial nerves 2-12 grossly intact, sensation is normal throughout Psych: A&Ox3 with an appropriate affect.  Results for orders placed or performed during the hospital encounter of 12/18/20 (from the past 48 hour(s))  Comprehensive metabolic panel     Status: Abnormal   Collection Time: 12/18/20 12:49 PM  Result Value Ref Range   Sodium 136 135 - 145 mmol/L   Potassium 3.7 3.5 - 5.1  mmol/L   Chloride 99 98 - 111 mmol/L   CO2 24 22 - 32 mmol/L   Glucose, Bld 107 (H) 70 - 99 mg/dL    Comment: Glucose reference range applies only to samples taken after fasting for at least 8 hours.   BUN 16 8 - 23 mg/dL   Creatinine, Ser 1.33 (H) 0.61 - 1.24 mg/dL   Calcium 9.7 8.9 - 10.3 mg/dL   Total Protein 8.3 (H) 6.5 - 8.1 g/dL   Albumin 4.7 3.5 - 5.0 g/dL   AST 23 15 - 41 U/L   ALT 17 0 - 44 U/L   Alkaline Phosphatase 50 38 - 126 U/L   Total Bilirubin 1.2 0.3 - 1.2 mg/dL   GFR, Estimated 57 (L) >60 mL/min    Comment: (NOTE) Calculated using the CKD-EPI Creatinine Equation (2021)    Anion gap 13 5 - 15    Comment: Performed at [facility] [address] [place], [place] [address]  Lipase, blood     Status: None   Collection Time: 12/18/20 12:49 PM  Result Value Ref  Range   Lipase 47 11 - 51 U/L    Comment: Performed at [facility] [address] [place], [place] [address]  CBC with Differential     Status: Abnormal   Collection Time: 12/18/20 12:49 PM  Result Value Ref Range   WBC 10.9 (H) 4.0 - 10.5 K/uL   RBC 4.45 4.22 - 5.81 MIL/uL   Hemoglobin 13.8 13.0 - 17.0 g/dL   HCT 42.1 39.0 - 52.0 %   MCV 94.6 80.0 - 100.0 fL   MCH 31.0 26.0 - 34.0 pg   MCHC 32.8 30.0 - 36.0 g/dL   RDW 13.2 11.5 - 15.5 %   Platelets 111 (L) 150 - 400 K/uL    Comment: Immature Platelet Fraction may be clinically indicated, consider ordering this additional test [id number] REPEATED TO VERIFY    nRBC 0.0 0.0 - 0.2 %   Neutrophils Relative % 73 %   Neutro Abs 8.0 (H) 1.7 - 7.7 K/uL   Lymphocytes Relative 13 %   Lymphs Abs 1.4 0.7 - 4.0 K/uL   Monocytes Relative 12 %   Monocytes Absolute 1.3 (H) 0.1 - 1.0 K/uL   Eosinophils Relative 1 %   Eosinophils Absolute 0.1 0.0 - 0.5 K/uL   Basophils Relative 0 %   Basophils Absolute 0.0 0.0 - 0.1 K/uL   Immature Granulocytes 1 %   Abs Immature Granulocytes 0.05 0.00 - 0.07 K/uL    Comment: Performed at [facility], [place] [address] [place], [place] [address]  I-Stat beta hCG blood, ED ([facility], [facility], [facility] only)     Status: None   Collection Time: 12/18/20 12:57 PM  Result Value Ref Range   I-stat hCG, quantitative <5.0 <5 mIU/mL    Comment: PATIENT IDENTIFICATION ERROR. PLEASE DISREGARD RESULTS. ACCOUNT WILL BE CREDITED. Performed at [facility], [place] [address], [place], [place] [address]    Comment 3            Comment:   GEST. AGE      CONC.  (mIU/mL)   <=1 WEEK        5 - 50     2 WEEKS       50 - 500     3 WEEKS       100 - 10,000     4 WEEKS     1,000 -  30,000        MALE AND NON-PREGNANT MALE:     LESS THAN 5 mIU/mL    US Abdomen Limited RUQ (LIVER/GB)  Result Date: 12/18/2020 CLINICAL DATA:  Right upper quadrant pain EXAM: ULTRASOUND ABDOMEN LIMITED RIGHT UPPER QUADRANT COMPARISON:  None.  FINDINGS: Gallbladder: Cholelithiasis, largest stone measures up to 3.1 cm. Mild gallbladder wall thickening. Positive sonographic Murphy sign noted by sonographer. Common bile duct: Diameter: 6.9 mm Liver: No focal lesion identified. Within normal limits in parenchymal echogenicity. Portal vein is patent on color Doppler imaging with normal direction of blood flow towards the liver. Other: Incidental note is made of a simple cyst of the mid right kidney measuring 4.9 x 4.7 x 4.6 cm. IMPRESSION: Mild gallbladder wall thickening with large gallstone and positive sonographic Murphy sign noted by sonographer, findings are concerning for acute cholecystitis. These results will be called to the ordering clinician or representative by the Radiologist Assistant, and communication documented in the PACS or [employer]. Electronically Signed   By: Yetta Glassman M.D.   On: 12/18/2020 10:46    Assessment/Plan Symptomatic cholelithiasis, Suspect cholecystitis  - afebrile, vitals stable, WBC 10.9, LFT's WNL  - RUQ ultrasound with stones and wall thickening, positive sonographic murphy's - given patient history, labs, imaging, and exam I do suspect he has cholecystitis and would benefit from cholecystectomy. - given cardiac history I will have cardiology consult for perioperative risk evaluation and optimization. - PMH ITP and cannot take antiplatelets. Order type and screen for the morning. - IV abx for cholecystitis - CMP in AM, NPO after MN for possible cholecystectomy.   Admit to medical service given multiple medical problems, cardiac history, AKI.   Obie Dredge, [facility] 12/18/2020, 3:54 PM Please see Amion for pager number during day hours 7:00am-4:30pm

## 2020-12-18 NOTE — Consult Note (Addendum)
Cardiology Consultation:   Patient ID: JAMAREON SHIMEL MRN: [id number]; DOB: 09-May-1950  Admit date: 12/18/2020 Date of Consult: 12/18/2020  PCP:  [name], MD   [facility] Providers Cardiologist:  Peter Martinique, MD        Patient Profile:   CONALL VANGORDER is a 70 y.o. 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 Surgery Dr. [name].  History of Present Illness:   Mr. 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  Cath pre-op with EF 45%, 80% stenosis in a nondominant RCA, 50% distal LAD.Marland Kitchen He underwent ascending aortic root replacement using 32 mm Gelweave Valsalva Graft and AVR using 29 mm Edwards Perimount Magna Ease aortic bioprosthesis valve 09/15/16 by Dr. Servando Snare.  He did need thoracentesis post op for pl effusion. Eventually pigtail drain and by 10/2016 resolution of Rt pneumothorax and effusion.  He has recurrent GI bleeds and with EGD and sigmoidoscopy in 04/2020 he had cardiac arrest requiring CPR and EPI with ROSC.  He was transfused, then 5 /2022 he had recurrent bleed again transfused.  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 He was seen by Dr Oneida Alar. Repair was deferred for nown.  In August pt with ITP BP was elevated and HR 43.  Atenolol dose reduced with improved HR and losartan increased.  At that time myoview study ordered and revealed inferior and apical ischemia with EF 42%.  He was seen by Dr. Martinique 09/11/20 who stated with his CAD as above and intermediate risk myoview - normally would plan cardiac caht but with his bleeding risk with ITP and recurrent GI bleeds he is poor candidate for PCI because of this.  Would cath if progressive angina.     Echo done 09/16/20 with  EF 65-70%, mild LVH, no RWMA, RV normal, mild MR, S/P AVR with 29 mm MagnaEase bioprosthesis (09/15/16) Peak and mean gradients throuugh the valve aer 22 and 11 mm Hg respectively. No significant change from report of April 2021. . The aortic valve has been repaired/replaced. Aortic valve regurgitation is not visualized.   He has been seen by [facility] 12/11/20 for CTA results with  maximum diameter of the ascending aorta at 4.6 cm.  plan for repeat CTA in 6 months.  BP that visit was 150/83.    ITP with weekly Nplate to keep platelets over 100k.    Today presented to ER sent by PCP for suspected acute cholecystitis.  He had Rt upper quad abd pain yesterday.  No N&V.  No chest pain.   WBC 10.9 Hgb 13.8 pts 111 was 78 on the 3rd.  Na 136, K+ 3.7 Cr 1.33 tprot 8.3, (Cr usually 1.15 to 1.22)  EKG:  The EKG was personally reviewed and demonstrates:  SR with non specific inter conduction delay.  Improved from August with Bigeminy PVC Telemetry:  Telemetry was personally reviewed and demonstrates:  SR with PVCs  Pt exercises daily at the [facility] for 30 min,.  Has not had chest pain or SOB.  He does walk with a cane other wise.  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      Past Medical  History:  Diagnosis Date   AAA (abdominal aortic aneurysm)    Anemia    Arthritis    Asymptomatic bilateral carotid artery stenosis 08/2015   1-39%    Cataracts, bilateral    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   Coronary artery disease    Diverticulosis    Enlarged aorta (HCC)    Enlarged prostate    slightly   GERD (gastroesophageal reflux disease)    takes Pantoprazole daily as needed   Glaucoma    uses eye drops daily   Headache    History of colon polyps    benign   History of kidney stones    Hyperlipidemia    no on any meds   Hypertension    takes Amlodipine and Atenolol daily   OSA on CPAP    Vocal cord nodule    pt. states  it's a" growth on vocal cord"    Past Surgical  History:  Procedure Laterality Date   AORTIC ARCH ANGIOGRAPHY N/A 04/16/2016   Procedure: Aortic Arch Angiography;  Surgeon: [name] Martinique, MD;  Location: [place] [facility];  Service: Cardiovascular;  Laterality: N/A;   AORTIC VALVE REPLACEMENT N/A 09/15/2016   Procedure: AORTIC VALVE REPLACEMENT (AVR);  Surgeon: Grace Isaac, MD;  Location: [place];  Service: Open Heart Surgery;  Laterality: N/A;  Using 41mm Edwards Perimount Magna Ease Aortic Bioprosthesis Valve   ASCENDING AORTIC ROOT REPLACEMENT N/A 09/15/2016   Procedure: ASCENDING AORTIC ROOT REPLACEMENT;  Surgeon: Grace Isaac, MD;  Location: [place];  Service: Open Heart Surgery;  Laterality: N/A;  Using 17mm Gelweave Valsalva Graft   COLONOSCOPY     COLONOSCOPY Left 04/16/2019   Procedure: COLONOSCOPY;  Surgeon: Arta Silence, MD;  Location: [facility] ENDOSCOPY;  Service: Endoscopy;  Laterality: Left;   COLONOSCOPY N/A 06/01/2020   Procedure: COLONOSCOPY;  Surgeon: Ronnette Juniper, MD;  Location: [place];  Service: Gastroenterology;  Laterality: N/A;   COLONOSCOPY WITH ESOPHAGOGASTRODUODENOSCOPY (EGD)     ESOPHAGOGASTRODUODENOSCOPY N/A 05/25/2020   Procedure: ESOPHAGOGASTRODUODENOSCOPY (EGD);  Surgeon: Wilford Corner, MD;  Location: [place];  Service: Endoscopy;  Laterality: N/A;   ESOPHAGOGASTRODUODENOSCOPY (EGD) WITH PROPOFOL Left 04/16/2019   Procedure: ESOPHAGOGASTRODUODENOSCOPY (EGD) WITH PROPOFOL;  Surgeon: Arta Silence, MD;  Location: [facility] ENDOSCOPY;  Service: Endoscopy;  Laterality: Left;   FLEXIBLE SIGMOIDOSCOPY N/A 05/25/2020   Procedure: FLEXIBLE SIGMOIDOSCOPY;  Surgeon: Wilford Corner, MD;  Location: [place];  Service: Endoscopy;  Laterality: N/A;   IR THORACENTESIS ASP PLEURAL SPACE W/IMG GUIDE  10/23/2016   MICROLARYNGOSCOPY Right 09/20/2017   Procedure: MICROLARYNGOSCOPY WITH  EXCISION OF VOCAL CORD LESION;  Surgeon: Leta Baptist, MD;  Location: [place];  Service: ENT;  Laterality: Right;    MULTIPLE EXTRACTIONS WITH ALVEOLOPLASTY N/A 06/10/2016   Procedure: Extraction of tooth #'s 2,8,13,15, and 29  with alveoloplasty, maxillary right and left buccal exostoses reductions, and gross debridement of remaining teeth.;  Surgeon: Lenn Cal, DDS;  Location: [place];  Service: Oral Surgery;  Laterality: N/A;   RIGHT/LEFT HEART CATH AND CORONARY ANGIOGRAPHY N/A 04/16/2016   Procedure: Right/Left Heart Cath and Coronary Angiography;  Surgeon: [name] Martinique, MD;  Location: [place] [facility];  Service: Cardiovascular;  Laterality: N/A;   TEE WITHOUT CARDIOVERSION N/A 09/15/2016   Procedure: TRANSESOPHAGEAL ECHOCARDIOGRAM (TEE);  Surgeon: Grace Isaac, MD;  Location: [place];  Service: Open Heart Surgery;  Laterality: N/A;     Home Medications:  Prior to Admission medications   Medication  Sig Start Date End Date Taking? Authorizing Provider  acetaminophen (TYLENOL) 500 MG tablet Take 1,000 mg by mouth every 6 (six) hours as needed for moderate pain.   Yes [provider]  amLODipine (NORVASC) 10 MG tablet Take 10 mg by mouth every evening.   Yes [provider]  atenolol (TENORMIN) 25 MG tablet Take 12.5 mg by mouth 2 (two) times daily. 08/26/20  Yes [name], MD  Ensure Plus (ENSURE PLUS) LIQD Take 237 mLs by mouth daily.   Yes [provider]  ferrous sulfate 325 (65 FE) MG tablet Take 325 mg by mouth 2 (two) times a week.   Yes [provider]  latanoprost (XALATAN) 0.005 % ophthalmic solution Place 1 drop into both eyes 2 (two) times daily.   Yes [provider]  losartan-hydrochlorothiazide (HYZAAR) 100-12.5 MG tablet Take 1 tablet by mouth daily. 07/08/20  Yes [provider]  Multiple Vitamin (MULTIVITAMIN WITH MINERALS) TABS tablet Take 1 tablet by mouth daily at 12 noon.   Yes [provider]  ondansetron (ZOFRAN) 4 MG tablet Take 1 tablet (4 mg total) by mouth every 8 (eight) hours as needed for nausea or  vomiting. 12/18/20  Yes Harris, Abigail, PA-C  pantoprazole (PROTONIX) 40 MG tablet Take 40 mg by mouth 2 (two) times daily. 11/09/20  Yes [provider]  polyethylene glycol (MIRALAX / GLYCOLAX) 17 g packet Take 17 g by mouth daily. Patient taking differently: Take 17 g by mouth daily as needed for moderate constipation. 06/04/20  Yes [name], MD  rosuvastatin (CRESTOR) 10 MG tablet Take 1 tablet by mouth once daily Patient taking differently: Take 10 mg by mouth every evening. 05/17/20  Yes Martinique, [name], MD  traMADol (ULTRAM) 50 MG tablet Take 1 tablet (50 mg total) by mouth every 6 (six) hours as needed. 12/18/20  Yes Harris, Abigail, PA-C  triamcinolone (KENALOG) 0.025 % cream Apply topically 2 (two) times daily. 09/06/20  Yes [provider]  triamcinolone cream (KENALOG) 0.1 % Apply 1 application topically 2 (two) times daily as needed (facial itching/rash). 05/16/20  Yes [provider]  acetaminophen (TYLENOL) 325 MG tablet Take 2 tablets (650 mg total) by mouth every 6 (six) hours as needed for mild pain or fever. Patient not taking: Reported on 12/18/2020 05/29/20   Estill Cotta, NP  psyllium (HYDROCIL/METAMUCIL) 95 % PACK Take 1 packet by mouth daily. Patient not taking: Reported on 12/18/2020 06/04/20   [name], MD    Inpatient Medications: Scheduled Meds:  Continuous Infusions:  cefTRIAXone (ROCEPHIN)  IV 2 g (12/18/20 1614)   PRN Meds:   Allergies:   No Known Allergies  Social History:   Social History   Socioeconomic History   Marital status: Married    Spouse name: Not on file   Number of children: 1   Years of education: Not on file   Highest education level: Not on file  Occupational History   Occupation: Musician  Tobacco Use   Smoking status: Former    Packs/day: 1.00    Years: 25.00    Pack years: 25.00    Types: Cigarettes   Smokeless tobacco: Never  Vaping Use   Vaping Use: Never used  Substance and Sexual  Activity   Alcohol use: No    Alcohol/week: 0.0 standard drinks   Drug use: No   Sexual activity: Not on file  Other Topics Concern   Not on file  Social History Narrative  Married.   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   Social Determinants of Health   Financial Resource Strain: Not on file  Food Insecurity: No Food Insecurity   Worried About Charity fundraiser in the Last Year: Never true   Ran Out of Food in the Last Year: Never true  Transportation Needs: No Transportation Needs   Lack of Transportation (Medical): No   Lack of Transportation (Non-Medical): No  Physical Activity: Not on file  Stress: Not on file  Social Connections: Not on file  Intimate Partner Violence: Not on file    Family History:    Family History  Problem Relation Age of Onset   Heart disease Father    Heart attack Father    Thyroid disease Sister      ROS:  Please see the history of present illness.  General:no colds or fevers, no weight changes Skin:no rashes or ulcers HEENT:no blurred vision, no congestion CV:see HPI PUL:see HPI, OSA on CPAP GI:no diarrhea constipation or melena, no indigestion GU:no hematuria, no dysuria MS:no joint pain, no claudication Neuro:no syncope, no lightheadedness Endo:no diabetes, no thyroid disease Heme : ITP  All other ROS reviewed and negative.     Physical Exam/Data:   Vitals:   12/18/20 1430 12/18/20 1500 12/18/20 1530 12/18/20 1600  BP: 128/85 130/77 138/86 (!) 142/82  Pulse: (!) 57 (!) 56 61   Resp: 17 13 (!) 22 19  Temp:      SpO2: 99% 98% 99% 100%  Weight:      Height:       No intake or output data in the 24 hours ending 12/18/20 1620 Last 3 Weights 12/18/2020 12/11/2020 11/28/2020  Weight (lbs) 212 lb 212 lb 204 lb  Weight (kg) 96.163 kg 96.163 kg 92.534 kg     Body mass index is 32.23 kg/m.  General:  Well nourished, well developed, in no acute distress HEENT: normal Neck: no JVD Vascular: No carotid bruits; Distal  pulses 2+ bilaterally Cardiac:  normal S1, S2; RRR; no murmur gallup rub or click Lungs:  clear to auscultation bilaterally, no wheezing, rhonchi or rales  Abd: soft, mild Rt upper quad tenderness, no hepatomegaly  Ext: no edema Musculoskeletal:  No deformities, BUE and BLE strength normal and equal Skin: warm and dry  Neuro:  alert and oriented X 3 MAE follows commands, no focal abnormalities noted Psych:  Normal affect   Relevant CV Studies: Abd u/s 12/18/20 IMPRESSION: Mild gallbladder wall thickening with large gallstone and positive sonographic Murphy sign noted by sonographer, findings are concerning for acute cholecystitis.   These results will be called to the ordering clinician or representative by the Radiologist Assistant, and communication documented in the PACS or [employer].    Cath 04/16/2016 Conclusion      Mid RCA lesion, 80 %stenosed. Ost LAD to Prox LAD lesion, 25 %stenosed. Dist LAD lesion, 50 %stenosed. There is mild left ventricular systolic dysfunction. LV end diastolic pressure is normal. The left ventricular ejection fraction is 45-50% by visual estimate. There is no mitral valve regurgitation. There is no aortic valve stenosis. LV end diastolic pressure is normal.   1. 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 Mild LV dysfunction. EF 45%. 3. Ascending thoracic aortic aneurysm. 4. 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 5. Normal Right heart  And LV filling pressures 6. Normal cardiac output.    Plan: Aortic root grafting and AVR.  Echo 09/16/2016 LV EF: 55% -   60%   Study Conclusions   - Left ventricle: The cavity size was normal. Wall thickness was   increased in a pattern of severe LVH. Systolic function was   normal. The estimated ejection fraction was in the range of 55%   to 60%. - Aortic valve: Valve area (VTI): 4.29 cm^2. Valve area (Vmax):   4.15 cm^2. Valve area (Vmean): 3.88 cm^2. - Mitral valve:  Severely calcified annulus. Valve area by   continuity equation (using LVOT flow): 3.35 cm^2. - Pericardium, extracardiac: Localized moderate pericardial   effusion posterior to the LA.       Echo 05/19/19: IMPRESSIONS     1. Left ventricular ejection fraction, by estimation, is 55 to 60%. The  left ventricle has normal function. The left ventricle has no regional  wall motion abnormalities. There is mild left ventricular hypertrophy of  the basal-septal segment. Left  ventricular diastolic parameters were normal.   2. Right ventricular systolic function is normal. The right ventricular  size is normal. There is normal pulmonary artery systolic pressure. The  estimated right ventricular systolic pressure is 60.1 mmHg.   3. Left atrial size was mildly dilated.   4. The mitral valve is normal in structure. Mild to moderate mitral valve  regurgitation. No evidence of mitral stenosis.   5. The aortic valve has been repaired/replaced. Aortic valve  regurgitation is not visualized. No aortic stenosis is present. 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 Procedure  Date: 2018. Echo findings are consistent  with normal structure and function of the aortic valve prosthesis. Aortic  valve mean gradient measures 10.8 mmHg. Aortic valve Vmax measures 2.12  m/s.   6. Aortic root/ascending aorta has been repaired/replaced.   7. The inferior vena cava is normal in size with greater than 50%  respiratory variability, suggesting right atrial pressure of 3 mmHg.   Comparison(s): Prior images unable to be directly viewed, comparison made  by report only.      CT ANGIOGRAPHY CHEST WITH CONTRAST   TECHNIQUE: Multidetector CT imaging of the chest was performed using the standard protocol during bolus administration of intravenous contrast. Multiplanar CT image reconstructions and MIPs were obtained to evaluate the vascular anatomy.   CONTRAST:  15mL ISOVUE-370  IOPAMIDOL (ISOVUE-370) INJECTION 76%   Creatinine was obtained on site at [facility] at [address]   Results: Creatinine 1.1 mg/dL.   COMPARISON:  11/17/2018   FINDINGS: Cardiovascular: Previous median sternotomy and aortic valve repair. The heart size appears normal. Mild aortic atherosclerosis.   The aortic root measures 4.2 cm in diameter, image 86/10. Unchanged from previous exam.   The sino-tubular junction measures 3 cm in diameter, image 79/10. Unchanged.   The ascending thoracic aorta measures 3.9 cm, image 80/10. Unchanged from previous exam.   The transverse aortic arch measures 4.2 cm, image 131/12. Unchanged from previous exam   The descending thoracic aorta measures 2.9 cm, image 131/10. Stable.   No signs of aortic dissection. The great vessels appear widely patent.   The main pulmonary artery is patent. No obstructing pulmonary embolus identified.   Mediastinum/Nodes: Normal appearance of the thyroid gland. The trachea appears patent and is midline. Normal appearance of the esophagus. No supraclavicular, axillary, mediastinal, or hilar lymph nodes.   Lungs/Pleura: No pleural effusion. No airspace consolidation. Posterolateral left upper lobe scar is unchanged.   Upper Abdomen: No acute abnormality. Gallstones. Left  upper pole renal calcification measures 1.1 cm.   Musculoskeletal: No acute findings.   Review of the MIP images confirms the above findings.   IMPRESSION: 1. 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 Recommend semi-annual imaging followup by CTA or MRA and referral to cardiothoracic surgery if not already obtained. This recommendation follows 2010 ACCF/AHA/AATS/ACR/ASA/SCA/SCAI/SIR/STS/SVM Guidelines for the Diagnosis and Management of Patients With Thoracic Aortic Disease. Circulation. 2010; 121: Y606-T01. Aortic aneurysm NOS (ICD10-I71.9) 2. Status post aortic  valve repair. 3. Gallstones. 4. Nonobstructing left renal calculus. 5. Aortic atherosclerosis.   Aortic Atherosclerosis (ICD10-I70.0).  Myoview 09/06/20: Study Highlights     The left ventricular ejection fraction is moderately decreased (30-44%). Nuclear stress EF: 42%. There was no ST segment deviation noted during stress. Defect 1: There is a medium defect of moderate severity present in the basal inferior, mid inferior and apex location. Defect 2: There is a small defect of moderate severity present in the apical inferior and apical lateral location. Findings consistent with ischemia. This is an intermediate risk study.   Abnormal study. TID is 1.21, cannot exclude balanced ischemia. There is a fixed defect in the inferior wall and apex that is moderately severe, with mild-moderate hypokinesis in this area. There is a separate small reversible defect of moderate severity in the apical and inferior regions suggestive of ischemia. EF is moderately decreased, though would consider echo for definitive evaluation of function and wall motion.    Laboratory Data:  High Sensitivity Troponin:  No results for input(s): TROPONINIHS in the last 720 hours.   Chemistry Recent Labs  Lab 12/18/20 1249  NA 136  K 3.7  CL 99  CO2 24  GLUCOSE 107*  BUN 16  CREATININE 1.33*  CALCIUM 9.7  GFRNONAA 57*  ANIONGAP 13    Recent Labs  Lab 12/18/20 1249  PROT 8.3*  ALBUMIN 4.7  AST 23  ALT 17  ALKPHOS 50  BILITOT 1.2   Lipids No results for input(s): CHOL, TRIG, HDL, LABVLDL, LDLCALC, CHOLHDL in the last 168 hours.  Hematology Recent Labs  Lab 12/18/20 1249  WBC 10.9*  RBC 4.45  HGB 13.8  HCT 42.1  MCV 94.6  MCH 31.0  MCHC 32.8  RDW 13.2  PLT 111*   Thyroid No results for input(s): TSH, FREET4 in the last 168 hours.  BNPNo results for input(s): BNP, PROBNP in the last 168 hours.  DDimer No results for input(s): DDIMER in the last 168 hours.   Radiology/Studies:  US  Abdomen Limited RUQ (LIVER/GB)  Result Date: 12/18/2020 CLINICAL DATA:  Right upper quadrant pain EXAM: ULTRASOUND ABDOMEN LIMITED RIGHT UPPER QUADRANT COMPARISON:  None. FINDINGS: Gallbladder: Cholelithiasis, largest stone measures up to 3.1 cm. Mild gallbladder wall thickening. Positive sonographic Murphy sign noted by sonographer. Common bile duct: Diameter: 6.9 mm Liver: No focal lesion identified. Within normal limits in parenchymal echogenicity. Portal vein is patent on color Doppler imaging with normal direction of blood flow towards the liver. Other: Incidental note is made of a simple cyst of the mid right kidney measuring 4.9 x 4.7 x 4.6 cm. IMPRESSION: Mild gallbladder wall thickening with large gallstone and positive sonographic Murphy sign noted by sonographer, findings are concerning for acute cholecystitis. These results will be called to the ordering clinician or representative by the Radiologist Assistant, and communication documented in the PACS or [employer]. Electronically Signed   By: Yetta Glassman M.D.   On: 12/18/2020 10:46  Assessment and Plan:   Pre-op eval for chole pt with known CAD but stable, no angina and is able to exercise without SOB or chest pain, class II risk at 0.9% risk of major cardiac event.  His AVR is stable.    S/p AVR with stable valve on last Echo in August 8/22 S/p aortic aneurysm repair followed by [facility], now 4.6 cm to be rechecked in 6 months CAD with known CAD and intermediate myoview but with ITP and pt without angina no cath for now. HTN need better control with aneurysm Chronic ITP on Np every week   plts 111. Iliac artery aneurysm bil followed by Dr. Charlotte Crumb is now retired will need follow up with vascular in future. Bradycardia on higher doses of BB.  OSA with CPAP, please use    Risk Assessment/Risk Scores:                For questions or updates, please contact [facility] Please consult [email or web address] for contact  info under    Signed, Cecilie Kicks, NP  12/18/2020 4:20 PM   Patient seen and examined   I agree with findings as noted by L Ingold above  Pt is a 70 yo with hx of  AV dz (s/p asc root replacement with 32 mm Gelweave Valsalva graft and 29 mm Edwards Perimount MagnaEase valve in 2018; Cath prior to surgery showed 80% mid RCA which was nondominant; mild dz elsewhere )   Patient followed in cardiology clinic by P Martinique   Myoview in Aug 2022 showed defect in inferior wall that was fixed and a small defect in inferior and lateral apex.LVEF 42%;  Echo in Aug 2022 LVEF 65 to 70%   AV prosthesis functioning well with mean gradient 11 mm Hg).   Pt also has hx of HTN, HL, OSA, ITP and bradycardia  The pt works out on exercise bike with arm movement at [facility]   30 min per session  No CP  No SOB   We are asked to see re preop risk assessment  ON exam Pt comfortable in bed Neck:   JVP is normal   NO bruits Lungs are CTA  Cardiac RRR  No S3    Abd  Deferred    Ext   NO edema   2+ edema   From a cardiac standpoint pt is at relatively low risk for major cardiac   I have reviewed images from Aug 2022   Again, inferior region small     Vessel(RCA) is nondominant) We will continue to follow in perioperative period  Would watch IV fluids and BP closely   Dorris Carnes MD

## 2020-12-18 NOTE — ED Notes (Signed)
ED TO INPATIENT HANDOFF REPORT  ED Nurse Name and Phone #: Baxter Flattery, RN  S Name/Age/Gender Lonnie Hansen 70 y.o. male Room/Bed: 033C/033C  Code Status   Code Status: Prior  Home/SNF/Other Home Patient oriented to: self, place, time, and situation Is this baseline? Yes   Triage Complete: Triage complete  Chief Complaint Cholecystitis [K81.9]  Triage Note Patient was sent by PCP for surgery. States he had an Korea this morning and found gallstones. Reports RUQ pain and belching x 2 days.    Allergies No Known Allergies  Level of Care/Admitting Diagnosis ED Disposition     ED Disposition  Admit   Condition  --   Comment  Hospital Area: [facility] [[id number]]  Level of Care: Telemetry Medical [104]  May admit patient to [name] or [facility] if equivalent level of care is available:: Yes  Covid Evaluation: Confirmed COVID Negative  Date Laboratory Confirmed COVID Negative: 12/18/2020  Diagnosis: Cholecystitis [818563]  Admitting Physician: [place], [place]  Attending Physician: Nita Sells [phone]  Estimated length of stay: 3 - 4 days  Certification:: I certify this patient will need inpatient services for at least 2 midnights          B Medical/Surgery History Past Medical History:  Diagnosis Date   AAA (abdominal aortic aneurysm)    Anemia    Arthritis    Asymptomatic bilateral carotid artery stenosis 08/2015   1-39%    Cataracts, bilateral    Chronic ITP (idiopathic thrombocytopenia) ([facility]) 03/31/2018   Coronary artery disease    Diverticulosis    Enlarged aorta ([facility])    Enlarged prostate    slightly   GERD (gastroesophageal reflux disease)    takes Pantoprazole daily as needed   Glaucoma    uses eye drops daily   Headache    History of colon polyps    benign   History of kidney stones    Hyperlipidemia    no on any meds   Hypertension    takes Amlodipine and Atenolol daily   OSA on CPAP    Vocal cord nodule     pt. states  it's a" growth on vocal cord"   Past Surgical History:  Procedure Laterality Date   AORTIC ARCH ANGIOGRAPHY N/A 04/16/2016   Procedure: Aortic Arch Angiography;  Surgeon: [name] Martinique, MD;  Location: [facility];  Service: Cardiovascular;  Laterality: N/A;   AORTIC VALVE REPLACEMENT N/A 09/15/2016   Procedure: AORTIC VALVE REPLACEMENT (AVR);  Surgeon: Grace Isaac, MD;  Location: [facility];  Service: Open Heart Surgery;  Laterality: N/A;  Using 7mm Edwards Perimount Magna Ease Aortic Bioprosthesis Valve   ASCENDING AORTIC ROOT REPLACEMENT N/A 09/15/2016   Procedure: ASCENDING AORTIC ROOT REPLACEMENT;  Surgeon: Grace Isaac, MD;  Location: [facility];  Service: Open Heart Surgery;  Laterality: N/A;  Using 14mm Gelweave Valsalva Graft   COLONOSCOPY     COLONOSCOPY Left 04/16/2019   Procedure: COLONOSCOPY;  Surgeon: Arta Silence, MD;  Location: [facility] ENDOSCOPY;  Service: Endoscopy;  Laterality: Left;   COLONOSCOPY N/A 06/01/2020   Procedure: COLONOSCOPY;  Surgeon: Ronnette Juniper, MD;  Location: [facility];  Service: Gastroenterology;  Laterality: N/A;   COLONOSCOPY WITH ESOPHAGOGASTRODUODENOSCOPY (EGD)     ESOPHAGOGASTRODUODENOSCOPY N/A 05/25/2020   Procedure: ESOPHAGOGASTRODUODENOSCOPY (EGD);  Surgeon: Wilford Corner, MD;  Location: [facility];  Service: Endoscopy;  Laterality: N/A;   ESOPHAGOGASTRODUODENOSCOPY (EGD) WITH PROPOFOL Left 04/16/2019   Procedure: ESOPHAGOGASTRODUODENOSCOPY (EGD) WITH PROPOFOL;  Surgeon: Paulita Fujita,  Gwyndolyn Saxon, MD;  Location: [facility] ENDOSCOPY;  Service: Endoscopy;  Laterality: Left;   FLEXIBLE SIGMOIDOSCOPY N/A 05/25/2020   Procedure: FLEXIBLE SIGMOIDOSCOPY;  Surgeon: Wilford Corner, MD;  Location: [facility];  Service: Endoscopy;  Laterality: N/A;   IR THORACENTESIS ASP PLEURAL SPACE W/IMG GUIDE  10/23/2016   MICROLARYNGOSCOPY Right 09/20/2017   Procedure: MICROLARYNGOSCOPY WITH  EXCISION OF VOCAL CORD LESION;  Surgeon: Leta Baptist, MD;  Location:  [facility];  Service: ENT;  Laterality: Right;   MULTIPLE EXTRACTIONS WITH ALVEOLOPLASTY N/A 06/10/2016   Procedure: Extraction of tooth #'s 2,8,13,15, and 29  with alveoloplasty, maxillary right and left buccal exostoses reductions, and gross debridement of remaining teeth.;  Surgeon: Lenn Cal, DDS;  Location: [facility];  Service: Oral Surgery;  Laterality: N/A;   RIGHT/LEFT HEART CATH AND CORONARY ANGIOGRAPHY N/A 04/16/2016   Procedure: Right/Left Heart Cath and Coronary Angiography;  Surgeon: [name] Martinique, MD;  Location: [facility];  Service: Cardiovascular;  Laterality: N/A;   TEE WITHOUT CARDIOVERSION N/A 09/15/2016   Procedure: TRANSESOPHAGEAL ECHOCARDIOGRAM (TEE);  Surgeon: Grace Isaac, MD;  Location: [facility];  Service: Open Heart Surgery;  Laterality: N/A;     A IV Location/Drains/Wounds Patient Lines/Drains/Airways Status     Active Line/Drains/Airways     Name Placement date Placement time Site Days   Peripheral IV 12/18/20 20 G Anterior;Distal;Right Forearm 12/18/20  1240  Forearm  less than 1   Incision (Closed) 09/15/16 Chest Other (Comment) 09/15/16  0914  -- 1555   Incision (Closed) 10/30/16 Chest Right;Mid 10/30/16  1100  -- 1510   Incision (Closed) 09/20/17 Throat Other (Comment) 09/20/17  1207  -- 1185            Intake/Output Last 24 hours  Intake/Output Summary (Last 24 hours) at 12/18/2020 1705 Last data filed at 12/18/2020 1644 Gross per 24 hour  Intake 100 ml  Output --  Net 100 ml    Labs/Imaging Results for orders placed or performed during the hospital encounter of 12/18/20 (from the past 48 hour(s))  Comprehensive metabolic panel     Status: Abnormal   Collection Time: 12/18/20 12:49 PM  Result Value Ref Range   Sodium 136 135 - 145 mmol/L   Potassium 3.7 3.5 - 5.1 mmol/L   Chloride 99 98 - 111 mmol/L   CO2 24 22 - 32 mmol/L   Glucose, Bld 107 (H) 70 - 99 mg/dL    Comment: Glucose reference range applies only  to samples taken after fasting for at least 8 hours.   BUN 16 8 - 23 mg/dL   Creatinine, Ser 1.33 (H) 0.61 - 1.24 mg/dL   Calcium 9.7 8.9 - 10.3 mg/dL   Total Protein 8.3 (H) 6.5 - 8.1 g/dL   Albumin 4.7 3.5 - 5.0 g/dL   AST 23 15 - 41 U/L   ALT 17 0 - 44 U/L   Alkaline Phosphatase 50 38 - 126 U/L   Total Bilirubin 1.2 0.3 - 1.2 mg/dL   GFR, Estimated 57 (L) >60 mL/min    Comment: (NOTE) Calculated using the CKD-EPI Creatinine Equation (2021)    Anion gap 13 5 - 15    Comment: Performed at [facility] [address] [place], [place] [address]  Lipase, blood     Status: None   Collection Time: 12/18/20 12:49 PM  Result Value Ref Range   Lipase 47 11 - 51 U/L    Comment: Performed at [facility]  Lab, [address], [place], [place] [address]  CBC with Differential     Status: Abnormal   Collection Time: 12/18/20 12:49 PM  Result Value Ref Range   WBC 10.9 (H) 4.0 - 10.5 K/uL   RBC 4.45 4.22 - 5.81 MIL/uL   Hemoglobin 13.8 13.0 - 17.0 g/dL   HCT 42.1 39.0 - 52.0 %   MCV 94.6 80.0 - 100.0 fL   MCH 31.0 26.0 - 34.0 pg   MCHC 32.8 30.0 - 36.0 g/dL   RDW 13.2 11.5 - 15.5 %   Platelets 111 (L) 150 - 400 K/uL    Comment: Immature Platelet Fraction may be clinically indicated, consider ordering this additional test [id number] REPEATED TO VERIFY    nRBC 0.0 0.0 - 0.2 %   Neutrophils Relative % 73 %   Neutro Abs 8.0 (H) 1.7 - 7.7 K/uL   Lymphocytes Relative 13 %   Lymphs Abs 1.4 0.7 - 4.0 K/uL   Monocytes Relative 12 %   Monocytes Absolute 1.3 (H) 0.1 - 1.0 K/uL   Eosinophils Relative 1 %   Eosinophils Absolute 0.1 0.0 - 0.5 K/uL   Basophils Relative 0 %   Basophils Absolute 0.0 0.0 - 0.1 K/uL   Immature Granulocytes 1 %   Abs Immature Granulocytes 0.05 0.00 - 0.07 K/uL    Comment: Performed at [facility], [place] [address] [place], [place] [address]  I-Stat beta hCG blood, ED ([facility], [facility], [facility] only)     Status: None   Collection Time: 12/18/20 12:57 PM  Result  Value Ref Range   I-stat hCG, quantitative <5.0 <5 mIU/mL    Comment: PATIENT IDENTIFICATION ERROR. PLEASE DISREGARD RESULTS. ACCOUNT WILL BE CREDITED. Performed at [facility], [place] [address], [place], [place] [address]    Comment 3            Comment:   GEST. AGE      CONC.  (mIU/mL)   <=1 WEEK        5 - 50     2 WEEKS       50 - 500     3 WEEKS       100 - 10,000     4 WEEKS     1,000 - 30,000        MALE AND NON-PREGNANT MALE:     LESS THAN 5 mIU/mL    US Abdomen Limited RUQ (LIVER/GB)  Result Date: 12/18/2020 CLINICAL DATA:  Right upper quadrant pain EXAM: ULTRASOUND ABDOMEN LIMITED RIGHT UPPER QUADRANT COMPARISON:  None. FINDINGS: Gallbladder: Cholelithiasis, largest stone measures up to 3.1 cm. Mild gallbladder wall thickening. Positive sonographic Murphy sign noted by sonographer. Common bile duct: Diameter: 6.9 mm Liver: No focal lesion identified. Within normal limits in parenchymal echogenicity. Portal vein is patent on color Doppler imaging with normal direction of blood flow towards the liver. Other: Incidental note is made of a simple cyst of the mid right kidney measuring 4.9 x 4.7 x 4.6 cm. IMPRESSION: Mild gallbladder wall thickening with large gallstone and positive sonographic Murphy sign noted by sonographer, findings are concerning for acute cholecystitis. These results will be called to the ordering clinician or representative by the Radiologist Assistant, and communication documented in the PACS or [employer]. Electronically Signed   By: Yetta Glassman M.D.   On: 12/18/2020 10:46    Pending Labs [employer] (From admission, onward)     Start     Ordered  12/19/20 0600  Type and screen [place]  Once,   R       Comments: [place]    12/18/20 1608   12/19/20 0500  Comprehensive metabolic panel  Tomorrow morning,   R        12/18/20 1608   12/19/20 0500  CBC  Tomorrow morning,   R       Question:   Specimen collection method  Answer:  IV Team   12/18/20 1624   12/18/20 1645  Resp Panel by RT-PCR (Flu A&B, Covid) Nasopharyngeal Swab  (Tier 2 - Symptomatic/asymptomatic)  Once,   R        12/18/20 1644            Vitals/Pain Today's Vitals   12/18/20 1430 12/18/20 1500 12/18/20 1530 12/18/20 1600  BP: 128/85 130/77 138/86 (!) 142/82  Pulse: (!) 57 (!) 56 61   Resp: 17 13 (!) 22 19  Temp:      SpO2: 99% 98% 99% 100%  Weight:      Height:      PainSc:        Isolation Precautions No active isolations  Medications Medications  cefTRIAXone (ROCEPHIN) 2 g in sodium chloride 0.9 % 100 mL IVPB (0 g Intravenous Stopped 12/18/20 1644)  0.9 %  sodium chloride infusion ( Intravenous New Bag/Given 12/18/20 1649)    Mobility walks Low fall risk   Focused Assessments Cardiac Assessment Handoff:    Lab Results  Component Value Date   CKTOTAL 204 04/28/2010   CKMB 1.7 04/28/2010   TROPONINI <0.03 10/14/2016   Lab Results  Component Value Date   DDIMER 19.54 (H) 05/25/2020   Does the Patient currently have chest pain? No    R Recommendations: See Admitting Provider Note  Report given to:   Additional Notes:

## 2020-12-18 NOTE — H&P (View-Only) (Signed)
Consult Note  JARAN SAINZ 25-Apr-1950  [id number].    Requesting MD: Margarita Mail PA-C Chief Complaint/Reason for Consult: Cholelithiasis HPI:  Patient is a 70 year old male who presented to [facility] at the direction of PCP after outpatient RUQ Korea this morning. Patient reports that he started having mild, nagging RUQ discomfort 3 days ago. As a response he decided to fast from solid food and see if that helped. Yesterday morning, he awoke with this pain again, saying it caused him to toss and turn all night. It was constant and moderate intensity, with intermittent exacerbations. He denied nausea or vomiting. Nothing improved or worsened pain. Denied fever,endorsed chills. Patient has known cholelithiasis and has had problems from this in the past but it had been a long time since he had any symptoms. When he was initially diagnosed with gallstones he cut back on fatty, greasy foods but states he has been on a greasy food kick lately. Pain currently only happens with deep palpation.   PMH significant for AAA, carotid artery stenosis, Chronic ITP, CAD, HLD, HTN, BPH, OSA on CPAP, and s/p aortic arch repair with AVR. No prior abdominal surgery. Not on any blood thinning agents. Followed by [name] for ITP. Cardiologist is Dr. Martinique. Dr. Oneida Alar was his vascular surgeon and Dr. Servando Snare did his aortic arch/AVR in 2019. 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 States he goes to the [facility] daily and does cardio and 10 lb hand weights without exertional chest pain or dyspnea. Denies tobacco or alcohol use.   ROS: Review of Systems  Constitutional:  Positive for chills.  Gastrointestinal:  Positive for abdominal pain.  Psychiatric/Behavioral:  The patient is nervous/anxious.   All other systems reviewed and are negative.  Family History  Problem Relation Age of Onset   Heart disease Father    Heart attack Father    Thyroid disease Sister     Past Medical History:  Diagnosis Date    AAA (abdominal aortic aneurysm)    Anemia    Arthritis    Asymptomatic bilateral carotid artery stenosis 08/2015   1-39%    Cataracts, bilateral    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   Coronary artery disease    Diverticulosis    Enlarged aorta ([facility])    Enlarged prostate    slightly   GERD (gastroesophageal reflux disease)    takes Pantoprazole daily as needed   Glaucoma    uses eye drops daily   Headache    History of colon polyps    benign   History of kidney stones    Hyperlipidemia    no on any meds   Hypertension    takes Amlodipine and Atenolol daily   OSA on CPAP    Vocal cord nodule    pt. states  it's a" growth on vocal cord"    Past Surgical History:  Procedure Laterality Date   AORTIC ARCH ANGIOGRAPHY N/A 04/16/2016   Procedure: Aortic Arch Angiography;  Surgeon: [name] Martinique, MD;  Location: [facility];  Service: Cardiovascular;  Laterality: N/A;   AORTIC VALVE REPLACEMENT N/A 09/15/2016   Procedure: AORTIC VALVE REPLACEMENT (AVR);  Surgeon: Grace Isaac, MD;  Location: [facility];  Service: Open Heart Surgery;  Laterality: N/A;  Using 13mm Edwards Perimount Magna Ease Aortic Bioprosthesis Valve   ASCENDING AORTIC ROOT REPLACEMENT N/A 09/15/2016   Procedure: ASCENDING AORTIC ROOT REPLACEMENT;  Surgeon: Grace Isaac, MD;  Location:  [facility];  Service: Open Heart Surgery;  Laterality: N/A;  Using 2mm Gelweave Valsalva Graft   COLONOSCOPY     COLONOSCOPY Left 04/16/2019   Procedure: COLONOSCOPY;  Surgeon: Arta Silence, MD;  Location: [facility] ENDOSCOPY;  Service: Endoscopy;  Laterality: Left;   COLONOSCOPY N/A 06/01/2020   Procedure: COLONOSCOPY;  Surgeon: Ronnette Juniper, MD;  Location: [facility];  Service: Gastroenterology;  Laterality: N/A;   COLONOSCOPY WITH ESOPHAGOGASTRODUODENOSCOPY (EGD)     ESOPHAGOGASTRODUODENOSCOPY N/A 05/25/2020   Procedure: ESOPHAGOGASTRODUODENOSCOPY (EGD);  Surgeon: Wilford Corner, MD;  Location: [facility];  Service: Endoscopy;  Laterality: N/A;   ESOPHAGOGASTRODUODENOSCOPY (EGD) WITH PROPOFOL Left 04/16/2019   Procedure: ESOPHAGOGASTRODUODENOSCOPY (EGD) WITH PROPOFOL;  Surgeon: Arta Silence, MD;  Location: [facility] ENDOSCOPY;  Service: Endoscopy;  Laterality: Left;   FLEXIBLE SIGMOIDOSCOPY N/A 05/25/2020   Procedure: FLEXIBLE SIGMOIDOSCOPY;  Surgeon: Wilford Corner, MD;  Location: [facility];  Service: Endoscopy;  Laterality: N/A;   IR THORACENTESIS ASP PLEURAL SPACE W/IMG GUIDE  10/23/2016   MICROLARYNGOSCOPY Right 09/20/2017   Procedure: MICROLARYNGOSCOPY WITH  EXCISION OF VOCAL CORD LESION;  Surgeon: Leta Baptist, MD;  Location: [facility];  Service: ENT;  Laterality: Right;   MULTIPLE EXTRACTIONS WITH ALVEOLOPLASTY N/A 06/10/2016   Procedure: Extraction of tooth #'s 2,8,13,15, and 29  with alveoloplasty, maxillary right and left buccal exostoses reductions, and gross debridement of remaining teeth.;  Surgeon: Lenn Cal, DDS;  Location: [facility];  Service: Oral Surgery;  Laterality: N/A;   RIGHT/LEFT HEART CATH AND CORONARY ANGIOGRAPHY N/A 04/16/2016   Procedure: Right/Left Heart Cath and Coronary Angiography;  Surgeon: [name] Martinique, MD;  Location: [facility];  Service: Cardiovascular;  Laterality: N/A;   TEE WITHOUT CARDIOVERSION N/A 09/15/2016   Procedure: TRANSESOPHAGEAL ECHOCARDIOGRAM (TEE);  Surgeon: Grace Isaac, MD;  Location: [facility];  Service: Open Heart Surgery;  Laterality: N/A;    Social History:  reports that he has quit smoking. His smoking use included cigarettes. He has a 25.00 pack-year smoking history. He has never used smokeless tobacco. He reports that he does not drink alcohol and does not use drugs.  Allergies: No Known Allergies  (Not in a hospital admission)   Blood pressure 130/77, pulse (!) 56, temperature 98.7 F (37.1 C), resp. rate 13, height 5\' 8"  (1.727 m), weight 96.2 kg, SpO2 98 %. Physical Exam:  General: pleasant, WD,   male who is laying in bed in NAD HEENT: head is normocephalic, atraumatic.  Sclera are noninjected.  PERRL.  Ears and nose without any masses or lesions.  Mouth is pink and moist Heart: regular, rate, and rhythm. No obvious murmurs, gallops, or rubs noted.  Palpable radial and pedal pulses bilaterally, no lower extremity edema Lungs: median sternotomy scar appears well-healing, CTAB, no wheezes, rhonchi, or rales noted.  Respiratory effort nonlabored Abd: soft, mild TTP RUQ and right upper flank without guarding or peritonitis, negative murphy's, ND, +BS, no masses, hernias, or organomegaly MS: all 4 extremities are symmetrical with no cyanosis, clubbing, or edema. Skin: warm and dry with no masses, lesions, or rashes Neuro: Cranial nerves 2-12 grossly intact, sensation is normal throughout Psych: A&Ox3 with an appropriate affect.  Results for orders placed or performed during the hospital encounter of 12/18/20 (from the past 48 hour(s))  Comprehensive metabolic panel     Status: Abnormal   Collection Time: 12/18/20 12:49 PM  Result Value Ref Range   Sodium 136 135 - 145 mmol/L   Potassium 3.7 3.5 - 5.1  mmol/L   Chloride 99 98 - 111 mmol/L   CO2 24 22 - 32 mmol/L   Glucose, Bld 107 (H) 70 - 99 mg/dL    Comment: Glucose reference range applies only to samples taken after fasting for at least 8 hours.   BUN 16 8 - 23 mg/dL   Creatinine, Ser 1.33 (H) 0.61 - 1.24 mg/dL   Calcium 9.7 8.9 - 10.3 mg/dL   Total Protein 8.3 (H) 6.5 - 8.1 g/dL   Albumin 4.7 3.5 - 5.0 g/dL   AST 23 15 - 41 U/L   ALT 17 0 - 44 U/L   Alkaline Phosphatase 50 38 - 126 U/L   Total Bilirubin 1.2 0.3 - 1.2 mg/dL   GFR, Estimated 57 (L) >60 mL/min    Comment: (NOTE) Calculated using the CKD-EPI Creatinine Equation (2021)    Anion gap 13 5 - 15    Comment: Performed at [facility] [address], [place], [place] [address]  Lipase, blood     Status: None   Collection Time: 12/18/20 12:49 PM  Result Value Ref  Range   Lipase 47 11 - 51 U/L    Comment: Performed at [facility] [address], [place], [place] [address]  CBC with Differential     Status: Abnormal   Collection Time: 12/18/20 12:49 PM  Result Value Ref Range   WBC 10.9 (H) 4.0 - 10.5 K/uL   RBC 4.45 4.22 - 5.81 MIL/uL   Hemoglobin 13.8 13.0 - 17.0 g/dL   HCT 42.1 39.0 - 52.0 %   MCV 94.6 80.0 - 100.0 fL   MCH 31.0 26.0 - 34.0 pg   MCHC 32.8 30.0 - 36.0 g/dL   RDW 13.2 11.5 - 15.5 %   Platelets 111 (L) 150 - 400 K/uL    Comment: Immature Platelet Fraction may be clinically indicated, consider ordering this additional test [id number] REPEATED TO VERIFY    nRBC 0.0 0.0 - 0.2 %   Neutrophils Relative % 73 %   Neutro Abs 8.0 (H) 1.7 - 7.7 K/uL   Lymphocytes Relative 13 %   Lymphs Abs 1.4 0.7 - 4.0 K/uL   Monocytes Relative 12 %   Monocytes Absolute 1.3 (H) 0.1 - 1.0 K/uL   Eosinophils Relative 1 %   Eosinophils Absolute 0.1 0.0 - 0.5 K/uL   Basophils Relative 0 %   Basophils Absolute 0.0 0.0 - 0.1 K/uL   Immature Granulocytes 1 %   Abs Immature Granulocytes 0.05 0.00 - 0.07 K/uL    Comment: Performed at [facility], [place] [address], [place], [place] [address]  I-Stat beta hCG blood, ED ([facility], [facility], [facility] only)     Status: None   Collection Time: 12/18/20 12:57 PM  Result Value Ref Range   I-stat hCG, quantitative <5.0 <5 mIU/mL    Comment: PATIENT IDENTIFICATION ERROR. PLEASE DISREGARD RESULTS. ACCOUNT WILL BE CREDITED. Performed at [facility], [place] [address] [place], [place] [address]    Comment 3            Comment:   GEST. AGE      CONC.  (mIU/mL)   <=1 WEEK        5 - 50     2 WEEKS       50 - 500     3 WEEKS       100 - 10,000     4 WEEKS     1,000 -  30,000        MALE AND NON-PREGNANT MALE:     LESS THAN 5 mIU/mL    US Abdomen Limited RUQ (LIVER/GB)  Result Date: 12/18/2020 CLINICAL DATA:  Right upper quadrant pain EXAM: ULTRASOUND ABDOMEN LIMITED RIGHT UPPER QUADRANT COMPARISON:  None.  FINDINGS: Gallbladder: Cholelithiasis, largest stone measures up to 3.1 cm. Mild gallbladder wall thickening. Positive sonographic Murphy sign noted by sonographer. Common bile duct: Diameter: 6.9 mm Liver: No focal lesion identified. Within normal limits in parenchymal echogenicity. Portal vein is patent on color Doppler imaging with normal direction of blood flow towards the liver. Other: Incidental note is made of a simple cyst of the mid right kidney measuring 4.9 x 4.7 x 4.6 cm. IMPRESSION: Mild gallbladder wall thickening with large gallstone and positive sonographic Murphy sign noted by sonographer, findings are concerning for acute cholecystitis. These results will be called to the ordering clinician or representative by the Radiologist Assistant, and communication documented in the PACS or [employer]. Electronically Signed   By: Yetta Glassman M.D.   On: 12/18/2020 10:46    Assessment/Plan Symptomatic cholelithiasis, Suspect cholecystitis  - afebrile, vitals stable, WBC 10.9, LFT's WNL  - RUQ ultrasound with stones and wall thickening, positive sonographic murphy's - given patient history, labs, imaging, and exam I do suspect he has cholecystitis and would benefit from cholecystectomy. - given cardiac history I will have cardiology consult for perioperative risk evaluation and optimization. - PMH ITP and cannot take antiplatelets. Order type and screen for the morning. - IV abx for cholecystitis - CMP in AM, NPO after MN for possible cholecystectomy.   Admit to medical service given multiple medical problems, cardiac history, AKI.   Obie Dredge, [facility] Surgery 12/18/2020, 3:54 PM Please see Amion for pager number during day hours 7:00am-4:30pm

## 2020-12-18 NOTE — ED Notes (Signed)
Called pt to be brought back to room, no response.

## 2020-12-19 ENCOUNTER — Inpatient Hospital Stay (HOSPITAL_COMMUNITY): Payer: Medicare Other | Admitting: Anesthesiology

## 2020-12-19 ENCOUNTER — Encounter (HOSPITAL_COMMUNITY)
Admission: EM | Disposition: A | Discharge status: Home / Self Care | Payer: Self-pay | Source: Home / Self Care | Attending: Family Medicine

## 2020-12-19 ENCOUNTER — Inpatient Hospital Stay (HOSPITAL_COMMUNITY): Payer: Medicare Other

## 2020-12-19 ENCOUNTER — Encounter (HOSPITAL_COMMUNITY): Payer: Self-pay | Admitting: Family Medicine

## 2020-12-19 DIAGNOSIS — K819 Cholecystitis, unspecified: Secondary | ICD-10-CM | POA: Diagnosis not present

## 2020-12-19 HISTORY — PX: CHOLECYSTECTOMY: SHX55

## 2020-12-19 LAB — COMPREHENSIVE METABOLIC PANEL
ALT: 14 U/L (ref 0–44)
AST: 20 U/L (ref 15–41)
Albumin: 3.7 g/dL (ref 3.5–5.0)
Alkaline Phosphatase: 45 U/L (ref 38–126)
Anion gap: 10 (ref 5–15)
BUN: 14 mg/dL (ref 8–23)
CO2: 25 mmol/L (ref 22–32)
Calcium: 8.8 mg/dL — ABNORMAL LOW (ref 8.9–10.3)
Chloride: 102 mmol/L (ref 98–111)
Creatinine, Ser: 1.11 mg/dL (ref 0.61–1.24)
GFR, Estimated: >60 mL/min (ref >60)
Glucose, Bld: 95 mg/dL (ref 70–99)
Potassium: 3.5 mmol/L (ref 3.5–5.1)
Sodium: 137 mmol/L (ref 135–145)
Total Bilirubin: 1 mg/dL (ref 0.3–1.2)
Total Protein: 6.9 g/dL (ref 6.5–8.1)

## 2020-12-19 LAB — CBC
HCT: 35.9 % — ABNORMAL LOW (ref 39.0–52.0)
Hemoglobin: 12.1 g/dL — ABNORMAL LOW (ref 13.0–17.0)
MCH: 31 pg (ref 26.0–34.0)
MCHC: 33.7 g/dL (ref 30.0–36.0)
MCV: 92.1 fL (ref 80.0–100.0)
Platelets: 92 10*3/uL — ABNORMAL LOW (ref 150–400)
RBC: 3.9 MIL/uL — ABNORMAL LOW (ref 4.22–5.81)
RDW: 13.2 % (ref 11.5–15.5)
WBC: 7.6 10*3/uL (ref 4.0–10.5)
nRBC: 0 % (ref 0.0–0.2)

## 2020-12-19 LAB — TYPE AND SCREEN
ABO/RH(D): AB POS
Antibody Screen: NEGATIVE

## 2020-12-19 LAB — PROTIME-INR
INR: 1.1 (ref 0.8–1.2)
Prothrombin Time: 14.5 seconds (ref 11.4–15.2)

## 2020-12-19 LAB — SURGICAL PCR SCREEN
MRSA, PCR: NEGATIVE
Staphylococcus aureus: NEGATIVE

## 2020-12-19 SURGERY — LAPAROSCOPIC CHOLECYSTECTOMY WITH INTRAOPERATIVE CHOLANGIOGRAM
Anesthesia: General | Site: Abdomen

## 2020-12-19 MED ORDER — ACETAMINOPHEN 10 MG/ML IV SOLN
INTRAVENOUS | Status: AC
  Filled 2020-12-19: qty 100

## 2020-12-19 MED ORDER — FENTANYL CITRATE (PF) 250 MCG/5ML IJ SOLN
INTRAMUSCULAR | Status: DC | PRN
  Administered 2020-12-19: 50 ug via INTRAVENOUS
  Administered 2020-12-19: 25 ug via INTRAVENOUS

## 2020-12-19 MED ORDER — SODIUM CHLORIDE 0.9 % IR SOLN
Status: DC | PRN
  Administered 2020-12-19: 1000 mL

## 2020-12-19 MED ORDER — LIDOCAINE 2% (20 MG/ML) 5 ML SYRINGE
INTRAMUSCULAR | Status: DC | PRN
  Administered 2020-12-19: 60 mg via INTRAVENOUS

## 2020-12-19 MED ORDER — KETOROLAC TROMETHAMINE 30 MG/ML IJ SOLN
INTRAMUSCULAR | Status: DC | PRN
  Administered 2020-12-19: 15 mg via INTRAVENOUS

## 2020-12-19 MED ORDER — PROPOFOL 10 MG/ML IV BOLUS
INTRAVENOUS | Status: AC
  Filled 2020-12-19: qty 20

## 2020-12-19 MED ORDER — PROPOFOL 10 MG/ML IV BOLUS
INTRAVENOUS | Status: DC | PRN
  Administered 2020-12-19: 100 mg via INTRAVENOUS

## 2020-12-19 MED ORDER — SODIUM CHLORIDE 0.9 % IV SOLN
INTRAVENOUS | Status: DC | PRN
  Administered 2020-12-19: 6 mL

## 2020-12-19 MED ORDER — ACETAMINOPHEN 500 MG PO TABS
1000.0000 mg | ORAL_TABLET | Freq: Three times a day (TID) | ORAL | Status: DC
  Administered 2020-12-19 – 2020-12-20 (×2): 1000 mg via ORAL
  Filled 2020-12-19 (×2): qty 2

## 2020-12-19 MED ORDER — ROCURONIUM BROMIDE 10 MG/ML (PF) SYRINGE
PREFILLED_SYRINGE | INTRAVENOUS | Status: DC | PRN
  Administered 2020-12-19: 60 mg via INTRAVENOUS
  Administered 2020-12-19: 10 mg via INTRAVENOUS

## 2020-12-19 MED ORDER — MIDAZOLAM HCL 2 MG/2ML IJ SOLN
INTRAMUSCULAR | Status: DC | PRN
  Administered 2020-12-19: 2 mg via INTRAVENOUS

## 2020-12-19 MED ORDER — BUPIVACAINE-EPINEPHRINE 0.25% -1:200000 IJ SOLN
INTRAMUSCULAR | Status: DC | PRN
  Administered 2020-12-19: 19 mL

## 2020-12-19 MED ORDER — DEXAMETHASONE SODIUM PHOSPHATE 10 MG/ML IJ SOLN
INTRAMUSCULAR | Status: DC | PRN
  Administered 2020-12-19: 10 mg via INTRAVENOUS

## 2020-12-19 MED ORDER — OXYCODONE HCL 5 MG PO TABS: 5.0000 mg | ORAL_TABLET | Freq: Four times a day (QID) | ORAL | Status: DC | PRN

## 2020-12-19 MED ORDER — SUGAMMADEX SODIUM 200 MG/2ML IV SOLN
INTRAVENOUS | Status: DC | PRN
  Administered 2020-12-19: 200 mg via INTRAVENOUS

## 2020-12-19 MED ORDER — MORPHINE SULFATE (PF) 2 MG/ML IV SOLN
1.0000 mg | INTRAVENOUS | Status: DC | PRN
  Administered 2020-12-19: 2 mg via INTRAVENOUS
  Filled 2020-12-19: qty 1

## 2020-12-19 MED ORDER — FENTANYL CITRATE (PF) 250 MCG/5ML IJ SOLN
INTRAMUSCULAR | Status: AC
  Filled 2020-12-19: qty 5

## 2020-12-19 MED ORDER — MIDAZOLAM HCL 2 MG/2ML IJ SOLN
INTRAMUSCULAR | Status: AC
  Filled 2020-12-19: qty 2

## 2020-12-19 MED ORDER — DEXAMETHASONE SODIUM PHOSPHATE 10 MG/ML IJ SOLN
INTRAMUSCULAR | Status: AC
  Filled 2020-12-19: qty 1

## 2020-12-19 MED ORDER — KETOROLAC TROMETHAMINE 30 MG/ML IJ SOLN
INTRAMUSCULAR | Status: AC
  Filled 2020-12-19: qty 1

## 2020-12-19 MED ORDER — ONDANSETRON HCL 4 MG/2ML IJ SOLN
INTRAMUSCULAR | Status: DC | PRN
  Administered 2020-12-19: 4 mg via INTRAVENOUS

## 2020-12-19 MED ORDER — PHENYLEPHRINE 40 MCG/ML (10ML) SYRINGE FOR IV PUSH (FOR BLOOD PRESSURE SUPPORT)
PREFILLED_SYRINGE | INTRAVENOUS | Status: AC
  Filled 2020-12-19: qty 10

## 2020-12-19 MED ORDER — BUPIVACAINE-EPINEPHRINE (PF) 0.25% -1:200000 IJ SOLN
INTRAMUSCULAR | Status: AC
  Filled 2020-12-19: qty 30

## 2020-12-19 MED ORDER — 0.9 % SODIUM CHLORIDE (POUR BTL) OPTIME
TOPICAL | Status: DC | PRN
  Administered 2020-12-19: 1000 mL

## 2020-12-19 MED ORDER — HYDROMORPHONE HCL 1 MG/ML IJ SOLN: 0.2500 mg | INTRAMUSCULAR | Status: DC | PRN

## 2020-12-19 MED ORDER — HEPARIN SODIUM (PORCINE) 5000 UNIT/ML IJ SOLN
5000.0000 [IU] | Freq: Three times a day (TID) | INTRAMUSCULAR | Status: DC
  Administered 2020-12-20: 5000 [IU] via SUBCUTANEOUS
  Filled 2020-12-19: qty 1

## 2020-12-19 MED ORDER — PHENYLEPHRINE HCL-NACL 20-0.9 MG/250ML-% IV SOLN
INTRAVENOUS | Status: DC | PRN
  Administered 2020-12-19: 25 ug/min via INTRAVENOUS

## 2020-12-19 MED ORDER — ACETAMINOPHEN 10 MG/ML IV SOLN
INTRAVENOUS | Status: DC | PRN
  Administered 2020-12-19: 1000 mg via INTRAVENOUS

## 2020-12-19 MED ORDER — LACTATED RINGERS IV SOLN: INTRAVENOUS | Status: DC | PRN

## 2020-12-19 MED ORDER — ONDANSETRON HCL 4 MG/2ML IJ SOLN
INTRAMUSCULAR | Status: AC
  Filled 2020-12-19: qty 2

## 2020-12-19 MED ORDER — PHENYLEPHRINE 40 MCG/ML (10ML) SYRINGE FOR IV PUSH (FOR BLOOD PRESSURE SUPPORT)
PREFILLED_SYRINGE | INTRAVENOUS | Status: DC | PRN
  Administered 2020-12-19: 80 ug via INTRAVENOUS

## 2020-12-19 MED ORDER — SODIUM CHLORIDE 0.9 % IV SOLN
3.0000 g | Freq: Four times a day (QID) | INTRAVENOUS | Status: DC
  Administered 2020-12-19 – 2020-12-20 (×4): 3 g via INTRAVENOUS
  Filled 2020-12-19 (×5): qty 8

## 2020-12-19 SURGICAL SUPPLY — 54 items
APL PRP STRL LF DISP 70% ISPRP (MISCELLANEOUS) ×1
APPLIER CLIP 5 13 M/L LIGAMAX5 (MISCELLANEOUS)
APR CLP MED LRG 5 ANG JAW (MISCELLANEOUS)
BAG COUNTER SPONGE SURGICOUNT (BAG) ×2 IMPLANT
BAG SPEC RTRVL 10 TROC 200 (ENDOMECHANICALS) ×1
BAG SPNG CNTER NS LX DISP (BAG) ×1
BLADE CLIPPER SURG (BLADE) ×1 IMPLANT
CANISTER SUCT 3000ML PPV (MISCELLANEOUS) ×2 IMPLANT
CHLORAPREP W/TINT 26 (MISCELLANEOUS) ×2 IMPLANT
CLIP APPLIE 5 13 M/L LIGAMAX5 (MISCELLANEOUS) ×1 IMPLANT
CLSR STERI-STRIP ANTIMIC 1/2X4 (GAUZE/BANDAGES/DRESSINGS) ×1 IMPLANT
COVER MAYO STAND STRL (DRAPES) ×2 IMPLANT
COVER SURGICAL LIGHT HANDLE (MISCELLANEOUS) ×2 IMPLANT
DRAPE C-ARM 42X120 X-RAY (DRAPES) ×2 IMPLANT
DRSG IV TEGADERM 3.5X4.5 STRL (GAUZE/BANDAGES/DRESSINGS) ×2 IMPLANT
DRSG TEGADERM 2-3/8X2-3/4 SM (GAUZE/BANDAGES/DRESSINGS) ×6 IMPLANT
DRSG TEGADERM 4X4.75 (GAUZE/BANDAGES/DRESSINGS) ×2 IMPLANT
ELECT REM PT RETURN 9FT ADLT (ELECTROSURGICAL) ×2
ELECTRODE REM PT RTRN 9FT ADLT (ELECTROSURGICAL) ×1 IMPLANT
GAUZE SPONGE 2X2 8PLY NS (GAUZE/BANDAGES/DRESSINGS) ×1 IMPLANT
GAUZE SPONGE 2X2 8PLY STRL LF (GAUZE/BANDAGES/DRESSINGS) ×1 IMPLANT
GLOVE SURG ENC TEXT LTX SZ7.5 (GLOVE) ×2 IMPLANT
GLOVE SURG POLYISO LF SZ7 (GLOVE) ×1 IMPLANT
GLOVE SURG UNDER LTX SZ8 (GLOVE) ×4 IMPLANT
GLOVE SURG UNDER POLY LF SZ7 (GLOVE) ×1 IMPLANT
GOWN STRL REUS W/ TWL LRG LVL3 (GOWN DISPOSABLE) ×3 IMPLANT
GOWN STRL REUS W/TWL 2XL LVL3 (GOWN DISPOSABLE) ×2 IMPLANT
GOWN STRL REUS W/TWL LRG LVL3 (GOWN DISPOSABLE) ×6
GRASPER SUT TROCAR 14GX15 (MISCELLANEOUS) ×2 IMPLANT
KIT BASIN OR (CUSTOM PROCEDURE TRAY) ×2 IMPLANT
KIT TURNOVER KIT B (KITS) ×2 IMPLANT
NS IRRIG 1000ML POUR BTL (IV SOLUTION) ×2 IMPLANT
PAD ARMBOARD 7.5X6 YLW CONV (MISCELLANEOUS) ×2 IMPLANT
POUCH RETRIEVAL ECOSAC 10 (ENDOMECHANICALS) ×1 IMPLANT
POUCH RETRIEVAL ECOSAC 10MM (ENDOMECHANICALS) ×2
SCISSORS LAP 5X35 DISP (ENDOMECHANICALS) ×2 IMPLANT
SET CHOLANGIOGRAPH 5 50 .035 (SET/KITS/TRAYS/PACK) ×2 IMPLANT
SET IRRIG TUBING LAPAROSCOPIC (IRRIGATION / IRRIGATOR) ×2 IMPLANT
SET TUBE SMOKE EVAC HIGH FLOW (TUBING) ×2 IMPLANT
SLEEVE ENDOPATH XCEL 5M (ENDOMECHANICALS) ×4 IMPLANT
SOL ANTI FOG 6CC (MISCELLANEOUS) IMPLANT
SOLUTION ANTI FOG 6CC (MISCELLANEOUS) ×1
SPECIMEN JAR SMALL (MISCELLANEOUS) ×2 IMPLANT
SPONGE GAUZE 2X2 STER 10/PKG (GAUZE/BANDAGES/DRESSINGS) ×1
STRIP CLOSURE SKIN 1/2X4 (GAUZE/BANDAGES/DRESSINGS) ×2 IMPLANT
SUT MNCRL AB 4-0 PS2 18 (SUTURE) ×2 IMPLANT
SUT VIC AB 0 UR5 27 (SUTURE) ×2 IMPLANT
SUT VICRYL 0 UR6 27IN ABS (SUTURE) ×1 IMPLANT
TOWEL GREEN STERILE (TOWEL DISPOSABLE) ×2 IMPLANT
TOWEL GREEN STERILE FF (TOWEL DISPOSABLE) ×2 IMPLANT
TRAY LAPAROSCOPIC MC (CUSTOM PROCEDURE TRAY) ×2 IMPLANT
TROCAR XCEL BLUNT TIP 100MML (ENDOMECHANICALS) ×2 IMPLANT
TROCAR XCEL NON-BLD 5MMX100MML (ENDOMECHANICALS) ×2 IMPLANT
WATER STERILE IRR 1000ML POUR (IV SOLUTION) ×1 IMPLANT

## 2020-12-19 NOTE — Progress Notes (Signed)
Pharmacy Antibiotic Note  Lonnie Hansen is a 70 y.o. male admitted on 12/18/2020 with abdominal pain, found to have acute cholecystitis and underwent lap chole with umbilical repair on 95/63.  Pharmacy has been consulted for Unasyn dosing.  SCr down 1.11, CrCL 69 ml/min, afebrile, WBC normalized.  Plan: Unasyn 3gm IV Q6H Pharmacy will sign off with stable renal fxn.  Thank you for the consult!  Height: 5\' 8"  (172.7 cm) Weight: 96.2 kg (212 lb) IBW/kg (Calculated) : 68.4  Temp (24hrs), Avg:98.3 F (36.8 C), Min:97 F (36.1 C), Max:99 F (37.2 C)  Recent Labs  Lab 12/18/20 1249 12/19/20 0047  WBC 10.9* 7.6  CREATININE 1.33* 1.11    Estimated Creatinine Clearance: 68.6 mL/min (by C-G formula based on SCr of 1.11 mg/dL).    No Known Allergies   Unasyn 11/24 >> CTX x1 11/23  Lonnie Hansen D. Mina Marble, PharmD, BCPS, [place] 12/19/2020, 1:41 PM

## 2020-12-19 NOTE — Anesthesia Procedure Notes (Signed)
Procedure Name: Intubation Date/Time: 12/19/2020 10:34 AM Performed by: Carolan Clines, CRNA Pre-anesthesia Checklist: Patient identified, Emergency Drugs available, Suction available and Patient being monitored Patient Re-evaluated:Patient Re-evaluated prior to induction Oxygen Delivery Method: Circle System Utilized Preoxygenation: Pre-oxygenation with 100% oxygen Induction Type: IV induction Ventilation: Mask ventilation without difficulty and Oral airway inserted - appropriate to patient size Laryngoscope Size: Mac and 4 Grade View: Grade I Tube type: Oral Tube size: 7.5 mm Number of attempts: 1 Airway Equipment and Method: Stylet and Oral airway Placement Confirmation: ETT inserted through vocal cords under direct vision, positive ETCO2 and breath sounds checked- equal and bilateral Secured at: 22 cm Tube secured with: Tape Dental Injury: Teeth and Oropharynx as per pre-operative assessment

## 2020-12-19 NOTE — Op Note (Signed)
Lonnie Hansen [id number] 1950/03/13 12/19/2020  Laparoscopic Cholecystectomy with IOC with primary umbilical hernia repair procedure Note  Indications: This patient presents with symptomatic gallbladder disease and will undergo laparoscopic cholecystectomy.  Pre-operative Diagnosis: acute calculous cholecystitis; umbilical hernia  Post-operative Diagnosis: Same  Surgeon: [name] MD FACS  Assistants: Gurney Maxin MD FACS (an assistant was requested due to patient's obesity, assistance with manipulation tissue and retraction of tissue given cholecystitis)  Anesthesia: General endotracheal anesthesia  Procedure Details  The patient was seen again in the Holding Room. The risks, benefits, complications, treatment options, and expected outcomes were discussed with the patient. The possibilities of reaction to medication, pulmonary aspiration, perforation of viscus, bleeding, recurrent infection, finding a normal gallbladder, the need for additional procedures, failure to diagnose a condition, the possible need to convert to an open procedure, and creating a complication requiring transfusion or operation were discussed with the patient. The likelihood of improving the patient's symptoms with return to their baseline status is good.  The patient and/or family concurred with the proposed plan, giving informed consent. The site of surgery properly noted. The patient was taken to Operating Room, identified as Lonnie Hansen and the procedure verified as Laparoscopic Cholecystectomy with Intraoperative Cholangiogram. A Time Out was held and the above information confirmed. Antibiotic prophylaxis was administered.   Prior to the induction of general anesthesia, antibiotic prophylaxis was administered. General endotracheal anesthesia was then administered and tolerated well. After the induction, the abdomen was prepped with Chloraprep and draped in the sterile fashion. The patient was positioned in  the supine position.  The patient had a pre-existing umbilical fascial defect probably of about 2 cm.  Since his gallbladder contained a 3 cm gallstone I decided to gain access to the abdomen at the umbilicus and used a pre-existing fascial defect.  Local anesthetic agent was injected into the skin near the umbilicus and an incision made. We dissected down to the abdominal fascia with blunt dissection.  The herniated omentum was reduced.  Patient had attenuated fascia.  A pursestring suture of 0-Vicryl was placed around the fascial opening.  The Hasson cannula was inserted and secured with the stay suture.  Pneumoperitoneum was then created with CO2 and tolerated well without any adverse changes in the patient's vital signs. An 5-mm port was placed in the subxiphoid position.  Two 5-mm ports were placed in the right upper quadrant. All skin incisions were infiltrated with a local anesthetic agent before making the incision and placing the trocars.   We positioned the patient in reverse Trendelenburg, tilted slightly to the patient's left.  The gallbladder was identified, the fundus grasped and retracted cephalad.  There was edema in the gallbladder wall.  It was distended.  Adhesions were lysed bluntly and with the electrocautery where indicated, taking care not to injure any adjacent organs or viscus. The infundibulum was grasped and retracted laterally, exposing the peritoneum overlying the triangle of Calot. This was then divided and exposed in a blunt fashion.  This tissue was friable.  A critical view of the cystic duct and cystic artery was obtained.  The patient had both an anterior and posterior branch of the cystic artery coming off a larger vessel presumably the right hepatic artery.  The cystic duct was clearly identified and bluntly dissected circumferentially. The cystic duct was ligated with a clip distally.   An incision was made in the cystic duct and the [facility] cholangiogram catheter introduced.  The catheter was secured using a clip.  A cholangiogram was then obtained which showed good visualization of the distal and proximal biliary tree with no sign of filling defects or obstruction.  Contrast flowed easily into the duodenum. The catheter was then removed.   The cystic duct was then ligated with clips and divided. The cystic artery (both the anterior and posterior branches )which had been identified & dissected free were ligated with clips and divided as well.  The larger more proximal right hepatic artery was preserved.  The gallbladder was dissected from the liver bed in retrograde fashion with the electrocautery. The gallbladder was removed and placed in an Ecco sac.The liver bed was irrigated and inspected. Hemostasis was achieved with the electrocautery. Copious irrigation was utilized and was repeatedly aspirated until clear.   The gallbladder and Ecco sac were then removed through the umbilical port site.  The pursestring suture was used to close the umbilical fascia.    We again inspected the right upper quadrant for hemostasis.  The umbilical closure was inspected and there was an air leak and nothing trapped within the closure.  4 additional interrupted 0 Vicryl sutures were placed at the umbilical fascia using a PMI suture passer.  The closure was airtight at this point.  Pneumoperitoneum was released as we removed the trocars.  3-0 Vicryl was used to tack the base of the umbilical stalk back down to the umbilical fascia.  Deep dermis at the umbilical incision was reapproximated with interrupted 3-0 Vicryl sutures.  4-0 Monocryl was used to close all of the skin.   Benzoin, steri-strips, and clean dressings were applied. The patient was then extubated and brought to the recovery room in stable condition. Instrument, sponge, and needle counts were correct at closure and at the conclusion of the case.   Findings: Cholecystitis with Cholelithiasis Nml ioc  Estimated Blood Loss:  Minimal         Drains: None         Specimens: Gallbladder           Complications: None; patient tolerated the procedure well.         Disposition: PACU - hemodynamically stable.         Condition: stable  Leighton Ruff. Redmond Pulling, MD, FACS General, Bariatric, & Minimally Invasive Surgery [facility], [place]

## 2020-12-19 NOTE — Transfer of Care (Signed)
Immediate Anesthesia Transfer of Care Note  Patient: Lonnie Hansen  Procedure(s) Performed: LAPAROSCOPIC CHOLECYSTECTOMY WITH POSSIBLE INTRAOPERATIVE CHOLANGIOGRAM (Abdomen)  Patient Location: PACU  Anesthesia Type:General  Level of Consciousness: awake, alert  and oriented  Airway & Oxygen Therapy: Patient Spontanous Breathing  Post-op Assessment: Report given to RN and Post -op Vital signs reviewed and stable  Post vital signs: Reviewed and stable  Last Vitals:  Vitals Value Taken Time  BP 122/67 12/19/20 1205  Temp    Pulse 61 12/19/20 1207  Resp 20 12/19/20 1207  SpO2 99 % 12/19/20 1207  Vitals shown include unvalidated device data.  Last Pain:  Vitals:   12/19/20 0842  TempSrc:   PainSc: 0-No pain         Complications: No notable events documented.

## 2020-12-19 NOTE — Anesthesia Postprocedure Evaluation (Signed)
Anesthesia Post Note  Patient: Lonnie Hansen  Procedure(s) Performed: LAPAROSCOPIC CHOLECYSTECTOMY WITH POSSIBLE INTRAOPERATIVE CHOLANGIOGRAM (Abdomen)     Patient location during evaluation: PACU Anesthesia Type: General Level of consciousness: awake and alert Pain management: pain level controlled Vital Signs Assessment: post-procedure vital signs reviewed and stable Respiratory status: spontaneous breathing, nonlabored ventilation and respiratory function stable Cardiovascular status: blood pressure returned to baseline and stable Postop Assessment: no apparent nausea or vomiting Anesthetic complications: no   No notable events documented.  Last Vitals:  Vitals:   12/19/20 1235 12/19/20 1250  BP: 120/65 116/67  Pulse:  60  Resp:  16  Temp: 36.8 C 36.7 C  SpO2:  95%    Last Pain:  Vitals:   12/19/20 1250  TempSrc: Oral  PainSc:                  Lisa Milian,W. EDMOND

## 2020-12-19 NOTE — Interval H&P Note (Signed)
History and Physical Interval Note:  12/19/2020 8:09 AM  Lonnie Hansen  has presented today for surgery, with the diagnosis of Acute Cholecystitis.  The various methods of treatment have been discussed with the patient and family. After consideration of risks, benefits and other options for treatment, the patient has consented to  Procedure(s): LAPAROSCOPIC CHOLECYSTECTOMY WITH POSSIBLE INTRAOPERATIVE CHOLANGIOGRAM (N/A) as a surgical intervention.  The patient's history has been reviewed, patient examined, no change in status, stable for surgery.  I have reviewed the patient's chart and labs.  Questions were answered to the patient's satisfaction.    Wife at [facility] remaining questions Will use umbilical fascial defect for extraction of gallbladder and primarily repair of umbilical hernia at end. Discussed rationale for no mesh. Explained that he is at slight increased risk of recurrence since we can't use mesh [name]

## 2020-12-19 NOTE — Progress Notes (Signed)
PROGRESS NOTE   Lonnie Hansen  LEX:[id number] DOB: [birth year] DOA: 12/18/2020 PCP: [name], MD  Brief Narrative:  65 completely independent blk male OSA/CPAP HTN HLD 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, AAA status postrepair with AVR 2019 follows with cardiothoracic surgery Recent admission 4/29 through 05/29/2020 hemorrhagic shock from diverticular bleed with readmission 5/6 through 06/03/2020 Prior admission in 2021 for diverticulosis, aortic root repair with tissue valve carotid stenosis Last EF 65-70% 09/16/2020  Admit to [facility] 11/23 with n/v x 3 days and as OP had [place] abd pelvis showing likely cholecystitis  Gen surg consulted  Hospital-Problem based course  Acute cholecystitis s/p Lap chole without mesh Cleared for surgery by Cardiology cont Unasyn for intra-abdominal coverage Placed on fluids 50 cc/H and allow liquid diet as per surgery Intraoperative cholangiogram was negative for any obstruction Appreciate gen surgery input --post-op instructions per them Mild AKI on admission 2/2  poor p.o. intake in addition to continued antihypertensive use--discontinued Hyzaar at this time Slight improvement in labs HTN Continue amlodipine 10, atenolol 12.5 twice daily 4.6 cm AAA Aortic root repair last echo showed normal EF AV--prosthesis functioning normally with EF 65 to 70% Follow-up with cardiovascular surgery in the outpatient setting Chronic ITP Patient is on Nplate in the outpatient setting and follows with [name] Stable in 90-100 range Recurrent GI bleeds significant hospitalizations 04/2020 with shock Resume Protonix  DVT prophylaxis: per gen surg Code Status: full Family Communication:  Disposition:  Status is: Inpatient  Remains inpatient appropriate because: Further work-up and discharge planning in the next 24 hours       Consultants:  Gen surg  Procedures: lap chole 11/24  Antimicrobials:   unasyn    Subjective:  Patient doing  well somewhat stoic and not taking pain meds-I have encouraged him to take them as needed He has been able to tolerate some liquid postoperatively  Objective: Vitals:   12/19/20 1220 12/19/20 1235 12/19/20 1250 12/19/20 1251  BP: 113/70 120/65 116/67   Pulse:   60   Resp:   16   Temp:  98.2 F (36.8 C) 98 F (36.7 C)   TempSrc:   Oral   SpO2:   95% 95%  Weight:      Height:        Intake/Output Summary (Last 24 hours) at 12/19/2020 1334 Last data filed at 12/19/2020 1143 Gross per 24 hour  Intake 1059.13 ml  Output 480 ml  Net 579.13 ml   Filed Weights   12/18/20 1152  Weight: 96.2 kg    Examination:  Awake coherent no distress EOMI NCAT no focal deficit CTA B no added sound no rales rhonchi ROM intact Abdomen soft with laparoscopy incisions noted S1-S2 no murmur telemetry shows some PVCs No lower extremity edema Psych euthymic Neuro intact power bilaterally with good motion  Data Reviewed: personally reviewed   CBC    Component Value Date/Time   WBC 7.6 12/19/2020 0047   RBC 3.90 (L) 12/19/2020 0047   HGB 12.1 (L) 12/19/2020 0047   HGB 12.2 (L) 11/28/2020 0953   HGB 13.1 08/17/2018 1204   HCT 35.9 (L) 12/19/2020 0047   HCT 38.6 08/17/2018 1204   PLT 92 (L) 12/19/2020 0047   PLT 78 (L) 11/28/2020 0953   PLT 89 (LL) 08/17/2018 1204   MCV 92.1 12/19/2020 0047   MCV 92 08/17/2018 1204   MCH 31.0 12/19/2020 0047   MCHC 33.7 12/19/2020 0047   RDW 13.2 12/19/2020 0047  RDW 14.6 08/17/2018 1204   LYMPHSABS 1.4 12/18/2020 1249   LYMPHSABS 1.3 08/17/2018 1204   MONOABS 1.3 (H) 12/18/2020 1249   EOSABS 0.1 12/18/2020 1249   EOSABS 0.3 08/17/2018 1204   BASOSABS 0.0 12/18/2020 1249   BASOSABS 0.0 08/17/2018 1204   CMP Latest Ref Rng & Units 12/19/2020 12/18/2020 11/28/2020  Glucose 70 - 99 mg/dL 95 107(H) 111(H)  BUN 8 - 23 mg/dL 14 16 18   Creatinine 0.61 - 1.24 mg/dL 1.11 1.33(H) 1.16  Sodium 135 - 145 mmol/L 137 136 140  Potassium 3.5 - 5.1 mmol/L 3.5  3.7 3.9  Chloride 98 - 111 mmol/L 102 99 102  CO2 22 - 32 mmol/L 25 24 30   Calcium 8.9 - 10.3 mg/dL 8.8(L) 9.7 10.2  Total Protein 6.5 - 8.1 g/dL 6.9 8.3(H) 7.4  Total Bilirubin 0.3 - 1.2 mg/dL 1.0 1.2 0.7  Alkaline Phos 38 - 126 U/L 45 50 45  AST 15 - 41 U/L 20 23 19   ALT 0 - 44 U/L 14 17 12      Radiology Studies: DG Cholangiogram Operative  Result Date: 12/19/2020 CLINICAL DATA:  70 year old male with cholelithiasis EXAM: INTRAOPERATIVE CHOLANGIOGRAM TECHNIQUE: Cholangiographic images from the C-arm fluoroscopic device were submitted for interpretation post-operatively. Please see the procedural report for the amount of contrast and the fluoroscopy time utilized. COMPARISON:  None. FINDINGS: Surgical instruments project over the upper abdomen. There is cannulation of the cystic duct/gallbladder neck, with antegrade infusion of contrast. Caliber of the extrahepatic ductal system within normal limits. No definite filling defect within the extrahepatic ducts identified. Free flow of contrast across the ampulla. IMPRESSION: Intraoperative cholangiogram demonstrates extrahepatic biliary ducts of unremarkable caliber, with no definite filling defects identified. Free flow of contrast across the ampulla. Please refer to the dictated operative report for full details of intraoperative findings and procedure Electronically Signed   By: Corrie Mckusick D.O.   On: 12/19/2020 11:42   US Abdomen Limited RUQ (LIVER/GB)  Result Date: 12/18/2020 CLINICAL DATA:  Right upper quadrant pain EXAM: ULTRASOUND ABDOMEN LIMITED RIGHT UPPER QUADRANT COMPARISON:  None. FINDINGS: Gallbladder: Cholelithiasis, largest stone measures up to 3.1 cm. Mild gallbladder wall thickening. Positive sonographic Murphy sign noted by sonographer. Common bile duct: Diameter: 6.9 mm Liver: No focal lesion identified. Within normal limits in parenchymal echogenicity. Portal vein is patent on color Doppler imaging with normal direction of  blood flow towards the liver. Other: Incidental note is made of a simple cyst of the mid right kidney measuring 4.9 x 4.7 x 4.6 cm. IMPRESSION: Mild gallbladder wall thickening with large gallstone and positive sonographic Murphy sign noted by sonographer, findings are concerning for acute cholecystitis. These results will be called to the ordering clinician or representative by the Radiologist Assistant, and communication documented in the PACS or [employer]. Electronically Signed   By: Yetta Glassman M.D.   On: 12/18/2020 10:46     Scheduled Meds:  acetaminophen  1,000 mg Oral Q8H   amLODipine  10 mg Oral Q1200   atenolol  12.5 mg Oral BID   [START ON 12/20/2020] heparin  5,000 Units Subcutaneous Q8H   latanoprost  1 drop Both Eyes BID   pantoprazole  40 mg Oral BID   triamcinolone   Topical BID   Continuous Infusions:  sodium chloride 50 mL/hr at 12/18/20 1805     LOS: 1 day   Time spent: [place], MD [facility] To contact the attending provider between 7A-7P or  the covering provider during after hours 7P-7A, please log into the web site [email or web address] and access using universal [place] password for that web site. If you do not have the password, please call the hospital operator.  12/19/2020, 1:34 PM

## 2020-12-19 NOTE — Anesthesia Preprocedure Evaluation (Addendum)
Anesthesia Evaluation  Patient identified by MRN, date of birth, ID band Patient awake    Reviewed: Allergy & Precautions, H&P , NPO status , Patient's Chart, lab work & pertinent test results, reviewed documented beta blocker date and time   Airway Mallampati: II  TM Distance: >3 FB Neck ROM: Full    Dental no notable dental hx. (+) Partial Lower, Partial Upper, Dental Advisory Given   Pulmonary sleep apnea and Continuous Positive Airway Pressure Ventilation , former smoker,    Pulmonary exam normal breath sounds clear to auscultation       Cardiovascular hypertension, Pt. on medications and Pt. on home beta blockers + Peripheral Vascular Disease   Rhythm:Regular Rate:Normal  S/p AVR   Neuro/Psych  Headaches, negative psych ROS   GI/Hepatic Neg liver ROS, GERD  Medicated,  Endo/Other  negative endocrine ROS  Renal/GU negative Renal ROS  negative genitourinary   Musculoskeletal  (+) Arthritis , Osteoarthritis,    Abdominal   Peds  Hematology  (+) Blood dyscrasia, anemia ,   Anesthesia Other Findings   Reproductive/Obstetrics negative OB ROS                            Anesthesia Physical Anesthesia Plan  ASA: 3  Anesthesia Plan: General   Post-op Pain Management: Ofirmev IV (intra-op)   Induction:   PONV Risk Score and Plan: 3 and Ondansetron, Dexamethasone and Treatment may vary due to age or medical condition  Airway Management Planned: Oral ETT  Additional Equipment:   Intra-op Plan:   Post-operative Plan: Extubation in OR  Informed Consent: I have reviewed the patients History and Physical, chart, labs and discussed the procedure including the risks, benefits and alternatives for the proposed anesthesia with the patient or authorized representative who has indicated his/her understanding and acceptance.     Dental advisory given  Plan Discussed with: CRNA  Anesthesia  Plan Comments:         Anesthesia Quick Evaluation

## 2020-12-20 ENCOUNTER — Encounter (HOSPITAL_COMMUNITY): Payer: Self-pay | Admitting: General Surgery

## 2020-12-20 DIAGNOSIS — K819 Cholecystitis, unspecified: Secondary | ICD-10-CM | POA: Diagnosis not present

## 2020-12-20 NOTE — Progress Notes (Signed)
Patient ID: Lonnie Hansen, male   DOB: 01/06/1951, 70 y.o.   MRN: [id number]   Acute Care Surgery Service Progress Note:    Chief Complaint/Subjective: Sore  No n/v Eating breakfast Walked some this am  Objective: Vital signs in last 24 hours: Temp:  [97 F (36.1 C)-98.4 F (36.9 C)] 98 F (36.7 C) (11/25 9381) Pulse Rate:  [60-64] 60 (11/25 0642) Resp:  [16-20] 18 (11/25 0642) BP: (107-130)/(65-80) 126/80 (11/25 0642) SpO2:  [95 %-98 %] 96 % (11/25 0642) FiO2 (%):  [21 %] 21 % (11/24 1251) Last BM Date: 12/18/20  Intake/Output from previous day: 11/24 0701 - 11/25 0700 In: 1545.2 [P.O.:460; I.V.:985.2; IV Piggyback:100] Out: 605 [Urine:600; Blood:5] Intake/Output this shift: No intake/output data recorded.  Lungs: cta, nonlabored  Cardiovascular: reg  Abd: soft, mild approp TTP, some bloody drainage on gauze.   Extremities: no edema, +SCDs  Neuro: alert, nonfocal  Lab Results: CBC  Recent Labs    12/18/20 1249 12/19/20 0047  WBC 10.9* 7.6  HGB 13.8 12.1*  HCT 42.1 35.9*  PLT 111* 92*   BMET Recent Labs    12/18/20 1249 12/19/20 0047  NA 136 137  K 3.7 3.5  CL 99 102  CO2 24 25  GLUCOSE 107* 95  BUN 16 14  CREATININE 1.33* 1.11  CALCIUM 9.7 8.8*   LFT Hepatic Function Latest Ref Rng & Units 12/19/2020 12/18/2020 11/28/2020  Total Protein 6.5 - 8.1 g/dL 6.9 8.3(H) 7.4  Albumin 3.5 - 5.0 g/dL 3.7 4.7 4.7  AST 15 - 41 U/L _0 ALT 0 - 44 U/L _1 Alk Phosphatase 38 - 126 U/L 45 50 45  Total Bilirubin 0.3 - 1.2 mg/dL 1.0 1.2 0.7  Bilirubin, Direct 0.00 - 0.40 mg/dL - - -   PT/INR Recent Labs    12/19/20 0047  LABPROT 14.5  INR 1.1   ABG No results for input(s): PHART, HCO3 in the last 72 hours.  Invalid input(s): PCO2, PO2  Studies/Results:  Anti-infectives: Anti-infectives (From admission, onward)    Start     Dose/Rate Route Frequency Ordered Stop   12/19/20 1430  Ampicillin-Sulbactam (UNASYN) 3 g in sodium chloride  0.9 % 100 mL IVPB        3 g 200 mL/hr over 30 Minutes Intravenous Every 6 hours 12/19/20 1342     12/18/20 1615  cefTRIAXone (ROCEPHIN) 2 g in sodium chloride 0.9 % 100 mL IVPB  Status:  Discontinued       Note to Pharmacy: Pharmacy may adjust dosing strength / duration / interval for maximal efficacy   2 g 200 mL/hr over 30 Minutes Intravenous Every 24 hours 12/18/20 1608 12/19/20 1248       Medications: Scheduled Meds:  acetaminophen  1,000 mg Oral Q8H   amLODipine  10 mg Oral Q1200   atenolol  12.5 mg Oral BID   heparin  5,000 Units Subcutaneous Q8H   latanoprost  1 drop Both Eyes BID   pantoprazole  40 mg Oral BID   triamcinolone   Topical BID   Continuous Infusions:  sodium chloride 50 mL/hr at 12/18/20 1805   ampicillin-sulbactam (UNASYN) IV 3 g (12/20/20 0754)   PRN Meds:.metoprolol tartrate, morphine injection, oxyCODONE, triamcinolone cream  Assessment/Plan: Patient Active Problem List   Diagnosis Date Noted   Cholecystitis 12/18/2020   Hemorrhagic shock (HCC)    AKI (acute kidney injury) ([place])    Cardiac arrest ([place])    GIB (gastrointestinal  bleeding) 05/24/2020   GI bleed 04/15/2019   Rectal bleeding 04/14/2019   Acute blood loss anemia 04/14/2019   Acute GI bleeding 04/14/2019   Unilateral primary osteoarthritis, 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   OSA on CPAP 03/04/2017   Vocal cord nodule    Sleep apnea    Nocturia    Joint swelling    Joint pain    Hypertension    History of kidney stones    History of colon polyps    Headache    Glaucoma    GERD (gastroesophageal reflux disease)    Enlarged prostate    Enlarged aorta (HCC)    Diverticulosis    Coronary artery disease    Cataracts, bilateral    Bruising    Blood dyscrasia    Arthritis    Anemia    Hydropneumothorax 10/29/2016   S/P AVR 09/15/2016   Coronary artery disease involving native coronary artery of native heart without angina  pectoris 04/29/2016   Aortic insufficiency 04/01/2016   Thoracic aortic aneurysm 09/18/2015   HTN (hypertension) 09/18/2015   Hyperlipidemia 09/18/2015   Asymptomatic bilateral carotid artery stenosis 08/27/2015   s/p Procedure(s): LAPAROSCOPIC CHOLECYSTECTOMY WITH POSSIBLE INTRAOPERATIVE CHOLANGIOGRAM 28/20/8138 & primary umbilical hernia repair  Tolerating diet, vitals ok, labs ok   Ok to go home Discussed dc instructions with pt and pain control and postop diet Disposition: d/w TRH  LOS: 2 days    Leighton Ruff. Redmond Pulling, MD, FACS General, Bariatric, & Minimally Invasive Surgery [phone] [facility]

## 2020-12-20 NOTE — Progress Notes (Signed)
Patient discharged to home with instructions and verbalized understanding. 

## 2020-12-20 NOTE — Discharge Summary (Signed)
Physician Discharge Summary  Lonnie Hansen GEZ:[id number] DOB: Apr 25, 1950 DOA: 12/18/2020  PCP: [name], MD  Admit date: 12/18/2020 Discharge date: 12/20/2020  Time spent: 37 minutes  Recommendations for Outpatient Follow-up:  Requires Chem-12, CBC 1 week Outpatient surgical management as per general surgery and follow-up weight restrictions as well as wound care per them Note discontinued this admission losartan HCTZ-for pain control around-the-clock Tylenol and as needed tramadol  Discharge Diagnoses:  MAIN problem for hospitalization   Acute cholecystitis  Please see below for itemized issues addressed in HOpsital- refer to other progress notes for clarity if needed  Discharge Condition: Improved  Diet recommendation: Heart healthy  Filed Weights   12/18/20 1152  Weight: 96.2 kg    History of present illness:   70 completely independent blk male OSA/CPAP HTN HLD 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, AAA status postrepair with AVR 2019 follows with cardiothoracic surgery Recent admission 4/29 through 05/29/2020 hemorrhagic shock from diverticular bleed with readmission 5/6 through 06/03/2020 Prior admission in 2021 for diverticulosis, aortic root repair with tissue valve carotid stenosis Last EF 65-70% 09/16/2020   Admit to [facility] 11/23 with n/v x 3 days and as OP had [place] abd pelvis showing likely cholecystitis   Gen surg consulted and patient underwent lap chole 11/24  Hospital Course:    Acute cholecystitis s/p Lap chole without mesh Cleared for surgery by Cardiology cont Unasyn for intra-abdominal coverage Tolerated diet-pain moderately controlled on Tylenol tramadol prescription given for both-outpatient further postop management per surgery Mild AKI on admission 2/2  poor p.o. intake in addition to continued antihypertensive use--discontinued Hyzaar at this time and could resume in the outpatient setting Azotemia improved on discharge HTN Continue  amlodipine 10, atenolol 12.5 twice daily 4.6 cm AAA Aortic root repair last echo showed normal EF AV--prosthesis functioning normally with EF 65 to 70% Follow-up with cardiovascular surgery in the outpatient setting Chronic ITP Patient is on Nplate in the outpatient setting and follows with [name] Stable in 90-100 range Recurrent GI bleeds significant hospitalizations 04/2020 with shock Resume Protonix   Discharge Exam: Vitals:   12/20/20 0019 12/20/20 0642  BP: 130/76 126/80  Pulse: 61 60  Resp: 16 18  Temp: 98.2 F (36.8 C) 98 F (36.7 C)  SpO2: 97% 96%    Subj on day of d/c   Awake pleasant no distress tolerated full diet has been ambulating to the bathroom-feels fairly well no nausea no vomiting somewhat constipated  General Exam on discharge  EOMI NCAT no focal deficit S1-S2 no murmur no rub no gallop Chest clear no rales rhonchi Abdomen soft with postop laparoscopy scars which are slightly tender but expected from postop state No lower extremity edema Neurologically intact no focal deficit  Discharge Instructions   Discharge Instructions     Diet - low sodium heart healthy   Complete by: As directed    Discharge instructions   Complete by: As directed    Make sure u take the pain meds as indicated--tylenol around the clock every 4 hours for 2-3 days and if pain above 6/10, take tramadol Follow gen surg recommendations for wound care and lifting precaution Get labs as an outpatient with primary MD Best of luck and enjoy the holiday   Increase activity slowly   Complete by: As directed    No wound care   Complete by: As directed       Allergies as of 12/20/2020   No Known Allergies  Medication List     STOP taking these medications    losartan-hydrochlorothiazide 100-12.5 MG tablet Commonly known as: HYZAAR       TAKE these medications    acetaminophen 500 MG tablet Commonly known as: TYLENOL Take 1,000 mg by mouth every 6 (six)  hours as needed for moderate pain.   acetaminophen 325 MG tablet Commonly known as: TYLENOL Take 2 tablets (650 mg total) by mouth every 6 (six) hours as needed for mild pain or fever.   amLODipine 10 MG tablet Commonly known as: NORVASC Take 10 mg by mouth every evening.   atenolol 25 MG tablet Commonly known as: TENORMIN Take 12.5 mg by mouth 2 (two) times daily.   Ensure Plus Liqd Take 237 mLs by mouth daily.   ferrous sulfate 325 (65 FE) MG tablet Take 325 mg by mouth 2 (two) times a week.   latanoprost 0.005 % ophthalmic solution Commonly known as: XALATAN Place 1 drop into both eyes 2 (two) times daily.   multivitamin with minerals Tabs tablet Take 1 tablet by mouth daily at 12 noon.   ondansetron 4 MG tablet Commonly known as: Zofran Take 1 tablet (4 mg total) by mouth every 8 (eight) hours as needed for nausea or vomiting.   pantoprazole 40 MG tablet Commonly known as: PROTONIX Take 40 mg by mouth 2 (two) times daily.   polyethylene glycol 17 g packet Commonly known as: MIRALAX / GLYCOLAX Take 17 g by mouth daily. What changed:  when to take this reasons to take this   psyllium 95 % Pack Commonly known as: HYDROCIL/METAMUCIL Take 1 packet by mouth daily.   rosuvastatin 10 MG tablet Commonly known as: CRESTOR Take 1 tablet by mouth once daily What changed: when to take this   traMADol 50 MG tablet Commonly known as: ULTRAM Take 1 tablet (50 mg total) by mouth every 6 (six) hours as needed.   triamcinolone cream 0.1 % Commonly known as: KENALOG Apply 1 application topically 2 (two) times daily as needed (facial itching/rash).   triamcinolone 0.025 % cream Commonly known as: KENALOG Apply topically 2 (two) times daily.       No Known Allergies  Follow-up [facility], [place]. Schedule an appointment as soon as possible for a visit .   Specialty: General Surgery Contact information: [address] [place] [phone]                 The results of significant diagnostics from this hospitalization (including imaging, microbiology, ancillary and laboratory) are listed below for reference.    Significant Diagnostic Studies: DG Cholangiogram Operative  Result Date: 12/19/2020 CLINICAL DATA:  70 year old male with cholelithiasis EXAM: INTRAOPERATIVE CHOLANGIOGRAM TECHNIQUE: Cholangiographic images from the C-arm fluoroscopic device were submitted for interpretation post-operatively. Please see the procedural report for the amount of contrast and the fluoroscopy time utilized. COMPARISON:  None. FINDINGS: Surgical instruments project over the upper abdomen. There is cannulation of the cystic duct/gallbladder neck, with antegrade infusion of contrast. Caliber of the extrahepatic ductal system within normal limits. No definite filling defect within the extrahepatic ducts identified. Free flow of contrast across the ampulla. IMPRESSION: Intraoperative cholangiogram demonstrates extrahepatic biliary ducts of unremarkable caliber, with no definite filling defects identified. Free flow of contrast across the ampulla. Please refer to the dictated operative report for full details of intraoperative findings and procedure Electronically Signed   By: Corrie Mckusick D.O.  On: 12/19/2020 11:42   MR FOOT LEFT WO CONTRAST  Result Date: 12/11/2020 CLINICAL DATA:  Foot pain, chronic, plantar fasciitis suspected EXAM: MRI OF THE LEFT FOOT WITHOUT CONTRAST TECHNIQUE: Multiplanar, multisequence MR imaging of the left foot was performed. No intravenous contrast was administered. COMPARISON:  Left foot radiograph 11/25/2020 FINDINGS: Bones/Joint/Cartilage There is hallux valgus with mild first MTP degenerative change. There is no evidence of acute fracture. Multiple hammertoes. Ligaments Intact Lisfranc ligament. Intact lateral ankle ligaments. Intact high ankle ligaments. Intact  medial ankle ligaments. Muscles and Tendons There is mild intermetatarsal muscle atrophy and edema. There is atrophy of the abductor digiti minimi muscle. Peroneal tendons, extensor tendons common flexor tendons of the ankle are intact. Soft tissues Along the distal central plantar fascia, there is a 3.2 x 1.4 x 4.3 cm T1 isointense/heterogeneous T2 mass corresponding to the palpable marker (series 8, image 18, series 4, image 12). There is another similar mass along the medial forefoot measuring 2.0 x 1.3 x 1.3 cm which abuts the great toe flexor tendon (series 8 image 8, series 4, image 23). IMPRESSION: Multiple T1 isointense/heterogeneous T2 signal masses, one along the distal central plantar fascia and another plantar to the first metatarsal abutting the great toe flexor tendon, measurements above. 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 Electronically Signed   By: Maurine Simmering M.D.   On: 12/11/2020 14:39   DG Foot Complete Left  Result Date: 11/25/2020 Please see detailed radiograph report in office note.  DG Foot Complete Right  Result Date: 11/25/2020 Please see detailed radiograph report in office note.  CT ANGIO CHEST AORTA W/CM & OR WO/CM  Result Date: 12/11/2020 CLINICAL DATA:  Thoracic aortic aneurysm follow-up. EXAM: CT ANGIOGRAPHY CHEST WITH CONTRAST TECHNIQUE: Multidetector CT imaging of the chest was performed using the standard protocol during bolus administration of intravenous contrast. Multiplanar CT image reconstructions and MIPs were obtained to evaluate the vascular anatomy. CONTRAST:  5mL ISOVUE-370 IOPAMIDOL (ISOVUE-370) INJECTION 76% COMPARISON:  CTA chest dated November 30, 2019. FINDINGS: Cardiovascular: Prior ascending thoracic aorta repair. Unchanged aneurysmal dilatation of the aortic arch measuring up to 4.6 cm (when re-measured in the same axial plane on series 5, image 44). No dissection. Normal heart size. Prior  AVR. No pericardial effusion. No pulmonary embolism. Mediastinum/Nodes: No enlarged mediastinal, hilar, or axillary lymph nodes. Thyroid gland, trachea, and esophagus demonstrate no significant findings. Lungs/Pleura: No focal consolidation, pleural effusion, or pneumothorax. Upper Abdomen: No acute abnormality. Unchanged gallstones, bilateral renal cysts, and dystrophic calcification in the upper pole of the left kidney. Musculoskeletal: No chest wall abnormality. No acute or significant osseous findings. Review of the MIP images confirms the above findings. IMPRESSION: 1. Prior ascending thoracic aorta repair with unchanged aneurysmal dilatation of the aortic arch measuring up to 4.6 cm. Ascending thoracic aortic aneurysm. Recommend semi-annual imaging followup by CTA or MRA and referral to cardiothoracic surgery if not already obtained. This recommendation follows 2010 ACCF/AHA/AATS/ACR/ASA/SCA/SCAI/SIR/STS/SVM Guidelines for the Diagnosis and Management of Patients With Thoracic Aortic Disease. Circulation. 2010; 121: N027-O536. Aortic aneurysm NOS (ICD10-I71.9) Electronically Signed   By: Titus Dubin M.D.   On: 12/11/2020 14:30   US Abdomen Limited RUQ (LIVER/GB)  Result Date: 12/18/2020 CLINICAL DATA:  Right upper quadrant pain EXAM: ULTRASOUND ABDOMEN LIMITED RIGHT UPPER QUADRANT COMPARISON:  None. FINDINGS: Gallbladder: Cholelithiasis, largest stone measures up to 3.1 cm. Mild gallbladder wall thickening. Positive sonographic Murphy sign noted by sonographer. Common bile duct: Diameter: 6.9  mm Liver: No focal lesion identified. Within normal limits in parenchymal echogenicity. Portal vein is patent on color Doppler imaging with normal direction of blood flow towards the liver. Other: Incidental note is made of a simple cyst of the mid right kidney measuring 4.9 x 4.7 x 4.6 cm. IMPRESSION: Mild gallbladder wall thickening with large gallstone and positive sonographic Murphy sign noted by sonographer,  findings are concerning for acute cholecystitis. These results will be called to the ordering clinician or representative by the Radiologist Assistant, and communication documented in the PACS or [employer]. Electronically Signed   By: Yetta Glassman M.D.   On: 12/18/2020 10:46    Microbiology: Recent Results (from the past 240 hour(s))  Resp Panel by RT-PCR (Flu A&B, Covid) Nasopharyngeal Swab     Status: None   Collection Time: 12/18/20  4:52 PM   Specimen: Nasopharyngeal Swab; Nasopharyngeal(NP) swabs in vial transport medium  Result Value Ref Range Status   SARS Coronavirus 2 by RT PCR NEGATIVE NEGATIVE Final    Comment: (NOTE) SARS-CoV-2 target nucleic acids are NOT DETECTED.  The SARS-CoV-2 RNA is generally detectable in upper respiratory specimens during the acute phase of infection. The lowest concentration of SARS-CoV-2 viral copies this assay can detect is 138 copies/mL. A negative result does not preclude SARS-Cov-2 infection and should not be used as the sole basis for treatment or other patient management decisions. A negative result may occur with  improper specimen collection/handling, submission of specimen other than nasopharyngeal swab, presence of viral mutation(s) within the areas targeted by this assay, and inadequate number of viral copies(<138 copies/mL). A negative result must be combined with clinical observations, patient history, and epidemiological information. The expected result is Negative.  Fact Sheet for Patients:  EntrepreneurPulse.com.au  Fact Sheet for Healthcare Providers:  IncredibleEmployment.be  This test is no t yet approved or cleared by the [place] FDA and  has been authorized for detection and/or diagnosis of SARS-CoV-2 by FDA under an Emergency Use Authorization (EUA). This EUA will remain  in effect (meaning this test can be used) for the duration of the COVID-19 declaration under Section  564(b)(1) of the Act, 21 U.S.C.section 360bbb-3(b)(1), unless the authorization is terminated  or revoked sooner.       Influenza A by PCR NEGATIVE NEGATIVE Final   Influenza B by PCR NEGATIVE NEGATIVE Final    Comment: (NOTE) The Xpert Xpress SARS-CoV-2/FLU/RSV plus assay is intended as an aid in the diagnosis of influenza from Nasopharyngeal swab specimens and should not be used as a sole basis for treatment. Nasal washings and aspirates are unacceptable for Xpert Xpress SARS-CoV-2/FLU/RSV testing.  Fact Sheet for Patients: EntrepreneurPulse.com.au  Fact Sheet for Healthcare Providers: IncredibleEmployment.be  This test is not yet approved or cleared by the [place] FDA and has been authorized for detection and/or diagnosis of SARS-CoV-2 by FDA under an Emergency Use Authorization (EUA). This EUA will remain in effect (meaning this test can be used) for the duration of the COVID-19 declaration under Section 564(b)(1) of the Act, 21 U.S.C. section 360bbb-3(b)(1), unless the authorization is terminated or revoked.  Performed at [facility], [place] [address] [place], [place] [address]   Surgical pcr screen     Status: None   Collection Time: 12/18/20 11:55 PM   Specimen: Nasal Mucosa; Nasal Swab  Result Value Ref Range Status   MRSA, PCR NEGATIVE NEGATIVE Final   Staphylococcus aureus NEGATIVE NEGATIVE Final    Comment: (NOTE) The Xpert SA  Assay (FDA approved for NASAL specimens in patients 56 years of age and older), is one component of a comprehensive surveillance program. It is not intended to diagnose infection nor to guide or monitor treatment. Performed at [facility], [place] [address], [place] [employer], [place] [address]      Labs: Basic Metabolic Panel: Recent Labs  Lab 12/18/20 1249 12/19/20 0047  NA 136 137  K 3.7 3.5  CL 99 102  CO2 24 25  GLUCOSE 107* 95  BUN 16 14  CREATININE 1.33* 1.11  CALCIUM 9.7  8.8*   Liver Function Tests: Recent Labs  Lab 12/18/20 1249 12/19/20 0047  AST 23 20  ALT 17 14  ALKPHOS 50 45  BILITOT 1.2 1.0  PROT 8.3* 6.9  ALBUMIN 4.7 3.7   Recent Labs  Lab 12/18/20 1249  LIPASE 47   No results for input(s): AMMONIA in the last 168 hours. CBC: Recent Labs  Lab 12/18/20 1249 12/19/20 0047  WBC 10.9* 7.6  NEUTROABS 8.0*  --   HGB 13.8 12.1*  HCT 42.1 35.9*  MCV 94.6 92.1  PLT 111* 92*   Cardiac Enzymes: No results for input(s): CKTOTAL, CKMB, CKMBINDEX, TROPONINI in the last 168 hours. BNP: BNP (last 3 results) No results for input(s): BNP in the last 8760 hours.  ProBNP (last 3 results) No results for input(s): PROBNP in the last 8760 hours.  CBG: No results for input(s): GLUCAP in the last 168 hours.     Signed:  Nita Sells MD   [facility] 12/20/2020, 8:43 AM

## 2020-12-23 ENCOUNTER — Other Ambulatory Visit: Payer: Self-pay | Admitting: *Deleted

## 2020-12-23 LAB — SURGICAL PATHOLOGY

## 2020-12-23 NOTE — Patient Outreach (Signed)
[facility]) Care Management  12/23/2020  WES LEZOTTE 12-25-1950 [id number]   RED ON EMMI ALERT - General Discharge Day # 1 Date: 11/27 Red Alert Reason: Scheduled follow up appointment?  I don't know   Outreach attempt #1, state not a good time to talk, on a business call.   Plan: RN CM will follow up within the next 3-5 business days.  [name], [place], MSN [place] [phone]

## 2020-12-28 NOTE — Progress Notes (Signed)
Cardiology Office Note    Date:  12/30/2020   ID:  Lonnie Hansen, DOB Nov 03, 1950, MRN [id number]  PCP:  [name], MD  Cardiologist:  Dr. Martinique   Chief Complaint  Patient presents with   Aortic Stenosis    History of Present Illness:  Lonnie Hansen is a 70 y.o. male is seen to discuss abnormal stress test. 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 In 2017 chest x-ray showed aortic root enlargement. He underwent CT that showed dilatation of proximal aorta. Descending aorta was normal. Echocardiogram obtained on 09/20/2015 showed EF 60-65%, moderate to severe AI, grade 1 DD. He was evaluated by Dr. Servando Snare. CT scan showed aortic root had enlarged to 5.2 cm. Given the size of aortic root and the degree of AI, it was recommended he undergo aortic root grafting and AVR. He underwent preoperative evaluation with cardiac catheterization on 04/14/2016 which showed moderate to severe AI, EF 45%, 80% stenosis in a nondominant RCA, 50% distal LAD. He eventually underwent the planned procedure with ascending aortic root replacement using 32 mm Gelweave Valsalva Graft and AVR using 29 mm Edwards Perimount Magna Ease aortic bioprosthesis valve on 09/15/2016 by Dr. Servando Snare.   When seen in follow up chest x-ray showed recurrent right pleural effusion.Echo on 10/09/2016 showed only a trivial amount of pericardial effusion, EF 60-65%. He underwent right thoracentesis again on 10/23/2016 with the removal 2 L of dark blood. Post procedure, he developed a right pneumothorax related to incomplete reexpansion of the right middle and lower lobe of the lung. He was admitted on 10/30/2016 for pigtail drain placement,  chest x-ray obtained on 11/04/2016 showed improvement of right pleural effusion and resolution of right pneumothorax. Followed by Dr. Servando Snare - Repeat CT chest in October 2020 looked good.   Patient was admitted in March 2021  with lower GI bleed. Upper EGD normal. Colonoscopy showed extensive diverticulosis but no active bleeding.   He was seen in April 2021 with symptoms of lightheadedness. Echo was unremarkable. Normal EF and normal AV prosthesis function.   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   He was admitted again in April 2022 with recurrent GI bleed. EGD and sigmoidoscopy done. Apparently has cardiac arrest requiring CPR and Epi with ROSC.  Bleeding scan showed uptake in the rectum. He was transfused. He was readmitted with recurrent bleed in May. Again transfused. Colonoscopy showed blood throughout the colon. 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 He was seen by Dr Oneida Alar. Repair was deferred for now.   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 BP was high and pulse was low at 43. Atenolol dose was reduced and losartan increased.  A Zio patch monitor was placed. He was concerned that he had coronary blockage so a Myoview study was ordered. This demonstrated ischemia in the inferior and apical region with EF 42%. Most recent platelet count was 99K. He currently denied any chest pain or dyspnea. Notes some memory loss. No further bleeding. Since he was asymptomatic and at high bleeding risk further invasive evaluation was deferred. Echo done 09/16/20 with EF 65-70%, mild LVH, no RWMA, RV normal, mild MR, S/P AVR with 29 mm MagnaEase bioprosthesis (09/15/16) Peak and mean gradients throuugh the valve are 22 and 11 mm Hg respectively. No significant change from  report of April 2021. . The aortic valve has been repaired/replaced. Aortic valve regurgitation is not visualized.    He has been seen by [facility] 12/11/20 for CTA results with maximum diameter of the ascending aorta at 4.6 cm.  plan for repeat CTA in 6 months.  He was admitted 12/18/20 with acute cholecystitis. Underwent lap Choly and umbilical hernia repair without complications. He notes  losartan HCT was not resumed post op. He denies any chest pain or SOB. Notes some bruising at op site but otherwise doing well.     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 states  it's a" growth on vocal cord"    Past Surgical History:  Procedure Laterality Date   AORTIC ARCH ANGIOGRAPHY N/A 04/16/2016   Procedure: Aortic Arch Angiography;  Surgeon: Chania Kochanski M Martinique, MD;  Location: [place] [facility];  Service: Cardiovascular;  Laterality: N/A;   AORTIC VALVE REPLACEMENT N/A 09/15/2016   Procedure: AORTIC VALVE REPLACEMENT (AVR);  Surgeon: Grace Isaac, MD;  Location: [place] [facility];  Service: Open Heart Surgery;  Laterality: N/A;  Using 37mm Edwards Perimount Magna Ease Aortic Bioprosthesis Valve   ASCENDING AORTIC ROOT REPLACEMENT N/A 09/15/2016   Procedure: ASCENDING AORTIC ROOT REPLACEMENT;  Surgeon: Grace Isaac, MD;  Location: [facility];  Service: Open Heart Surgery;  Laterality: N/A;  Using 21mm Gelweave Valsalva Graft   CHOLECYSTECTOMY N/A 12/19/2020   Procedure: LAPAROSCOPIC CHOLECYSTECTOMY WITH POSSIBLE INTRAOPERATIVE CHOLANGIOGRAM;  Surgeon: [name], MD;  Location: [place];  Service: General;  Laterality: N/A;   COLONOSCOPY     COLONOSCOPY Left 04/16/2019   Procedure: COLONOSCOPY;  Surgeon: Arta Silence, MD;  Location: [facility];  Service:  Endoscopy;  Laterality: Left;   COLONOSCOPY N/A 06/01/2020   Procedure: COLONOSCOPY;  Surgeon: Ronnette Juniper, MD;  Location: [facility] [place];  Service: Gastroenterology;  Laterality: N/A;   COLONOSCOPY WITH ESOPHAGOGASTRODUODENOSCOPY (EGD)     ESOPHAGOGASTRODUODENOSCOPY N/A 05/25/2020   Procedure: ESOPHAGOGASTRODUODENOSCOPY (EGD);  Surgeon: Wilford Corner, MD;  Location: [place];  Service: Endoscopy;  Laterality: N/A;   ESOPHAGOGASTRODUODENOSCOPY (EGD) WITH PROPOFOL Left 04/16/2019   Procedure: ESOPHAGOGASTRODUODENOSCOPY (EGD) WITH PROPOFOL;  Surgeon: Arta Silence, MD;  Location: [facility];  Service: Endoscopy;  Laterality: Left;   FLEXIBLE SIGMOIDOSCOPY N/A 05/25/2020   Procedure: FLEXIBLE SIGMOIDOSCOPY;  Surgeon: Wilford Corner, MD;  Location: [place];  Service: Endoscopy;  Laterality: N/A;   IR THORACENTESIS ASP PLEURAL SPACE W/IMG GUIDE  10/23/2016   MICROLARYNGOSCOPY Right 09/20/2017   Procedure: MICROLARYNGOSCOPY WITH  EXCISION OF VOCAL CORD LESION;  Surgeon: Leta Baptist, MD;  Location: [place];  Service: ENT;  Laterality: Right;   MULTIPLE EXTRACTIONS WITH ALVEOLOPLASTY N/A  06/10/2016   Procedure: Extraction of tooth #'s 2,8,13,15, and 29  with alveoloplasty, maxillary right and left buccal exostoses reductions, and gross debridement of remaining teeth.;  Surgeon: Lenn Cal, DDS;  Location: [place];  Service: Oral Surgery;  Laterality: N/A;   RIGHT/LEFT HEART CATH AND CORONARY ANGIOGRAPHY N/A 04/16/2016   Procedure: Right/Left Heart Cath and Coronary Angiography;  Surgeon: Marqus Macphee M Martinique, MD;  Location: [place] [facility];  Service: Cardiovascular;  Laterality: N/A;   TEE WITHOUT CARDIOVERSION N/A 09/15/2016   Procedure: TRANSESOPHAGEAL ECHOCARDIOGRAM (TEE);  Surgeon: Grace Isaac, MD;  Location: [place];  Service: Open Heart Surgery;  Laterality: N/A;    Current Medications: Outpatient Medications Prior to Visit  Medication Sig Dispense Refill    acetaminophen (TYLENOL) 325 MG tablet Take 2 tablets (650 mg total) by mouth every 6 (six) hours as needed for mild pain or fever.     acetaminophen (TYLENOL) 500 MG tablet Take 1,000 mg by mouth every 6 (six) hours as needed for moderate pain.     amLODipine (NORVASC) 10 MG tablet Take 10 mg by mouth every evening.     atenolol (TENORMIN) 25 MG tablet Take 12.5 mg by mouth 2 (two) times daily. 180 tablet 3   Ensure Plus (ENSURE PLUS) LIQD Take 237 mLs by mouth daily.     ferrous sulfate 325 (65 FE) MG tablet Take 325 mg by mouth 2 (two) times a week.     latanoprost (XALATAN) 0.005 % ophthalmic solution Place 1 drop into both eyes 2 (two) times daily.     Multiple Vitamin (MULTIVITAMIN WITH MINERALS) TABS tablet Take 1 tablet by mouth daily at 12 noon.     ondansetron (ZOFRAN) 4 MG tablet Take 1 tablet (4 mg total) by mouth every 8 (eight) hours as needed for nausea or vomiting. 10 tablet 0   pantoprazole (PROTONIX) 40 MG tablet Take 40 mg by mouth 2 (two) times daily.     polyethylene glycol (MIRALAX / GLYCOLAX) 17 g packet Take 17 g by mouth daily. (Patient taking differently: Take 17 g by mouth daily as needed for moderate constipation.) 14 each 0   psyllium (HYDROCIL/METAMUCIL) 95 % PACK Take 1 packet by mouth daily. 240 each 3   rosuvastatin (CRESTOR) 10 MG tablet Take 1 tablet by mouth once daily (Patient taking differently: Take 10 mg by mouth every evening.) 90 tablet 1   traMADol (ULTRAM) 50 MG tablet Take 1 tablet (50 mg total) by mouth every 6 (six) hours as needed. 15 tablet 0   triamcinolone (KENALOG) 0.025 % cream Apply topically 2 (two) times daily.     triamcinolone cream (KENALOG) 0.1 % Apply 1 application topically 2 (two) times daily as needed (facial itching/rash).     No facility-administered medications prior to visit.     Allergies:   Patient has no known allergies.   Social History   Socioeconomic History   Marital status: Married    Spouse name: Not on file    Number of children: 1   Years of education: Not on file   Highest education level: Not on file  Occupational History   Occupation: Musician  Tobacco Use   Smoking status: Former    Packs/day: 1.00    Years: 25.00    Pack years: 25.00    Types: Cigarettes   Smokeless tobacco: Never  Vaping Use   Vaping Use: Never used  Substance and Sexual Activity   Alcohol use: No    Alcohol/week:  0.0 standard drinks   Drug use: No   Sexual activity: Not on file  Other Topics Concern   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   Social Determinants of Health   Financial Resource Strain: Not on file  Food Insecurity: No Food Insecurity   Worried About Charity fundraiser in the Last Year: Never true   Ran Out of Food in the Last Year: Never true  Transportation Needs: No Transportation Needs   Lack of Transportation (Medical): No   Lack of Transportation (Non-Medical): No  Physical Activity: Not on file  Stress: Not on file  Social Connections: Not on file     Family History:  The patient's family history includes Heart attack in his father; Heart disease in his father; Thyroid disease in his sister.   ROS:   Please see the history of present illness.    ROS All other systems reviewed and are negative.   PHYSICAL EXAM:   VS:  BP 137/78   Pulse 77   Ht 5\' 8"  (1.727 m)   Wt 206 lb 9.6 oz (93.7 kg)   SpO2 100%   BMI 31.41 kg/m    GENERAL:  Well appearing, obese BM in NAD. Walks with a cane. HEENT:  PERRL, EOMI, sclera are clear. Oropharynx is clear. NECK:  No jugular venous distention, carotid upstroke brisk and symmetric, no bruits, no thyromegaly or adenopathy LUNGS:  Clear to auscultation bilaterally CHEST:  Unremarkable HEART:  RRR,  PMI not displaced or sustained,S1 and S2 within normal limits, no S3, no S4: no clicks, no rubs, gr 2/6 SEM at apex ABD:  Soft, nontender. BS +, no masses or bruits. No hepatomegaly, no  splenomegaly. Bruising below umbilicus EXT:  2 + pulses throughout, no edema, no cyanosis no clubbing SKIN:  Warm and dry.  No rashes NEURO:  Alert and oriented x 3. Cranial nerves II through XII intact. PSYCH:  Cognitively intact   Wt Readings from Last 3 Encounters:  12/30/20 206 lb 9.6 oz (93.7 kg)  12/18/20 212 lb (96.2 kg)  12/11/20 212 lb (96.2 kg)      Studies/Labs Reviewed:   EKG:  EKG is not ordered today.     Recent Labs: 05/26/2020: Magnesium 2.0 12/19/2020: ALT 14; BUN 14; Creatinine, Ser 1.11; Hemoglobin 12.1; Platelets 92; Potassium 3.5; Sodium 137   Lipid Panel    Component Value Date/Time   CHOL 157 05/11/2019 0946   TRIG 201 (H) 05/26/2020 0439   HDL 32 (L) 05/11/2019 0946   CHOLHDL 4.9 05/11/2019 0946   CHOLHDL 3.5 04/28/2010 0703   VLDL 14 04/28/2010 0703   LDLCALC 81 05/11/2019 0946   Dated 02/21/20: creatinine 1.38. potassium 3.5. Hgb 11.3.   Additional studies/ records that were reviewed today include:   Echo 09/20/2015 LV EF: 60% -   65%   Study Conclusions   - Left ventricle: The cavity size was normal. There was moderate   focal basal hypertrophy of the septum. Systolic function was   normal. The estimated ejection fraction was in the range of 60%   to 65%. Wall motion was normal; there were no regional wall   motion abnormalities. Doppler parameters are consistent with   abnormal left ventricular relaxation (grade 1 diastolic   dysfunction). - Aortic valve: There was moderate to severe regurgitation. - Aorta: Aortic root dimension: 43 mm (ED). - Ascending aorta: The ascending  aorta was mildly dilated. - Right ventricle: The cavity size was mildly dilated. Wall   thickness was normal.       Cath 04/16/2016 Conclusion      Mid RCA lesion, 80 %stenosed. Ost LAD to Prox LAD lesion, 25 %stenosed. Dist LAD lesion, 50 %stenosed. There is mild left ventricular systolic dysfunction. LV end diastolic pressure is normal. The left  ventricular ejection fraction is 45-50% by visual estimate. There is no mitral valve regurgitation. There is no aortic valve stenosis. LV end diastolic pressure is normal.   1. 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 Mild LV dysfunction. EF 45%. 3. Ascending thoracic aortic aneurysm. 4. 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 5. Normal Right heart  And LV filling pressures 6. Normal cardiac output.    Plan: Aortic root grafting and AVR.         Echo 09/16/2016 LV EF: 55% -   60%   Study Conclusions   - Left ventricle: The cavity size was normal. Wall thickness was   increased in a pattern of severe LVH. Systolic function was   normal. The estimated ejection fraction was in the range of 55%   to 60%. - Aortic valve: Valve area (VTI): 4.29 cm^2. Valve area (Vmax):   4.15 cm^2. Valve area (Vmean): 3.88 cm^2. - Mitral valve: Severely calcified annulus. Valve area by   continuity equation (using LVOT flow): 3.35 cm^2. - Pericardium, extracardiac: Localized moderate pericardial   effusion posterior to the LA.    Echo 05/19/19: IMPRESSIONS     1. Left ventricular ejection fraction, by estimation, is 55 to 60%. The  left ventricle has normal function. The left ventricle has no regional  wall motion abnormalities. There is mild left ventricular hypertrophy of  the basal-septal segment. Left  ventricular diastolic parameters were normal.   2. Right ventricular systolic function is normal. The right ventricular  size is normal. There is normal pulmonary artery systolic pressure. The  estimated right ventricular systolic pressure is 95.2 mmHg.   3. Left atrial size was mildly dilated.   4. The mitral valve is normal in structure. Mild to moderate mitral valve  regurgitation. No evidence of mitral stenosis.   5. The aortic valve has been repaired/replaced. Aortic valve  regurgitation is not visualized. No aortic stenosis is present. There is a  29 mm Magna  bioprosthetic valve present in the aortic position. Procedure  Date: 2018. Echo findings are consistent  with normal structure and function of the aortic valve prosthesis. Aortic  valve mean gradient measures 10.8 mmHg. Aortic valve Vmax measures 2.12  m/s.   6. Aortic root/ascending aorta has been repaired/replaced.   7. The inferior vena cava is normal in size with greater than 50%  respiratory variability, suggesting right atrial pressure of 3 mmHg.   Comparison(s): Prior images unable to be directly viewed, comparison made  by report only.    CT ANGIOGRAPHY CHEST WITH CONTRAST   TECHNIQUE: Multidetector CT imaging of the chest was performed using the standard protocol during bolus administration of intravenous contrast. Multiplanar CT image reconstructions and MIPs were obtained to evaluate the vascular anatomy.   CONTRAST:  35mL ISOVUE-370 IOPAMIDOL (ISOVUE-370) INJECTION 76%   Creatinine was obtained on site at [facility] at [address]   Results: Creatinine 1.1 mg/dL.   COMPARISON:  11/17/2018   FINDINGS: Cardiovascular: Previous median sternotomy and aortic valve repair. The heart size appears normal. Mild aortic atherosclerosis.   The aortic root  measures 4.2 cm in diameter, image 86/10. Unchanged from previous exam.   The sino-tubular junction measures 3 cm in diameter, image 79/10. Unchanged.   The ascending thoracic aorta measures 3.9 cm, image 80/10. Unchanged from previous exam.   The transverse aortic arch measures 4.2 cm, image 131/12. Unchanged from previous exam   The descending thoracic aorta measures 2.9 cm, image 131/10. Stable.   No signs of aortic dissection. The great vessels appear widely patent.   The main pulmonary artery is patent. No obstructing pulmonary embolus identified.   Mediastinum/Nodes: Normal appearance of the thyroid gland. The trachea appears patent and is midline. Normal appearance of the esophagus. No  supraclavicular, axillary, mediastinal, or hilar lymph nodes.   Lungs/Pleura: No pleural effusion. No airspace consolidation. Posterolateral left upper lobe scar is unchanged.   Upper Abdomen: No acute abnormality. Gallstones. Left upper pole renal calcification measures 1.1 cm.   Musculoskeletal: No acute findings.   Review of the MIP images confirms the above findings.   IMPRESSION: 1. 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 Recommend semi-annual imaging followup by CTA or MRA and referral to cardiothoracic surgery if not already obtained. This recommendation follows 2010 ACCF/AHA/AATS/ACR/ASA/SCA/SCAI/SIR/STS/SVM Guidelines for the Diagnosis and Management of Patients With Thoracic Aortic Disease. Circulation. 2010; 121: O841-Y60. Aortic aneurysm NOS (ICD10-I71.9) 2. Status post aortic valve repair. 3. Gallstones. 4. Nonobstructing left renal calculus. 5. Aortic atherosclerosis.   Aortic Atherosclerosis (ICD10-I70.0).     Electronically Signed   By: Kerby Moors M.D.   On: 11/30/2019 10:02   Myoview 09/06/20: Study Highlights    The left ventricular ejection fraction is moderately decreased (30-44%). Nuclear stress EF: 42%. There was no ST segment deviation noted during stress. Defect 1: There is a medium defect of moderate severity present in the basal inferior, mid inferior and apex location. Defect 2: There is a small defect of moderate severity present in the apical inferior and apical lateral location. Findings consistent with ischemia. This is an intermediate risk study.   Abnormal study. TID is 1.21, cannot exclude balanced ischemia. There is a fixed defect in the inferior wall and apex that is moderately severe, with mild-moderate hypokinesis in this area. There is a separate small reversible defect of moderate severity in the apical and inferior regions suggestive of ischemia. EF is moderately decreased,  though would consider echo for definitive evaluation of function and wall motion.    Echo 09/16/20: IMPRESSIONS     1. Left ventricular ejection fraction, by estimation, is 65 to 70%. The  left ventricle has normal function. The left ventricle has no regional  wall motion abnormalities. There is mild left ventricular hypertrophy.  Left ventricular diastolic parameters  were normal.   2. Right ventricular systolic function is normal. The right ventricular  size is normal.   3. Mild mitral valve regurgitation.   4. 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 No  significant change from report of April 2021. . The aortic valve has been  repaired/replaced. Aortic valve  regurgitation is not visualized.  ASSESSMENT:    1. Coronary artery disease of native artery of native heart with stable angina pectoris ([place])   2. S/P AVR (aortic valve replacement)   3. Aneurysm of descending thoracic aorta without rupture   4. Iliac artery aneurysm, bilateral ([place])   5. Essential hypertension  PLAN:  In order of problems listed above:  H/o Aortic root repair and AVR with a tissue valve.:   Repeat Echo in March 2021 showed good prosthetic valve function. Repeat CT in November showed stable 4.3 cm aneurysm.  Echo in August [place]  Recurrent right pleural effusion- resolved.   CAD: asymptomatic.  80% mid nondominant RCA and a 50% distal LAD lesion on previous cardiac catheterization in 2018.  Abnormal myoview with some inferior and apical ischemia. This was an intermediate risk study. Ordinarily would consider repeat coronary angiography but his bleeding risk is high with ITP and recurrent major lower GI bleeds. He is a poor candidate for PCI because of this. I would not recommend invasive evaluation unless he were to have progressive angina despite medical therapy.   Hypertension: Blood pressure is under good  control. Currently off losartan HCT. On atenolol and amlodipine. If BP increases will need to go back on losartan.   Hyperlipidemia:   6.   ITP followed by Dr [name].   7.   Bradycardia. Improved today on lower dose of atenolol.   8.   Iliac artery aneurysm. Followed by [facility].  9.  S/p lap choly and umbilical hernia repair.    Medication Adjustments/Labs and Tests Ordered: Current medicines are reviewed at length with the patient today.  Concerns regarding medicines are outlined above.  Medication changes, Labs and Tests ordered today are listed in the Patient Instructions below. There are no Patient Instructions on file for this visit.    Signed, Casimir Barcellos Martinique, MD  12/30/2020 11:50 AM    [address], [place], [place]  [address] Phone: [phone]; Fax: [phone]

## 2020-12-30 ENCOUNTER — Other Ambulatory Visit: Payer: Self-pay

## 2020-12-30 ENCOUNTER — Other Ambulatory Visit: Payer: Self-pay | Admitting: *Deleted

## 2020-12-30 ENCOUNTER — Ambulatory Visit (INDEPENDENT_AMBULATORY_CARE_PROVIDER_SITE_OTHER): Payer: Medicare Other | Admitting: Cardiology

## 2020-12-30 ENCOUNTER — Encounter: Payer: Self-pay | Admitting: Cardiology

## 2020-12-30 VITALS — BP 137/78 | HR 77 | Ht 68.0 in | Wt 206.6 lb

## 2020-12-30 DIAGNOSIS — I7123 Aneurysm of the descending thoracic aorta, without rupture: Secondary | ICD-10-CM

## 2020-12-30 DIAGNOSIS — I723 Aneurysm of iliac artery: Secondary | ICD-10-CM | POA: Diagnosis not present

## 2020-12-30 DIAGNOSIS — I2510 Atherosclerotic heart disease of native coronary artery without angina pectoris: Secondary | ICD-10-CM

## 2020-12-30 DIAGNOSIS — I25118 Atherosclerotic heart disease of native coronary artery with other forms of angina pectoris: Secondary | ICD-10-CM

## 2020-12-30 DIAGNOSIS — I10 Essential (primary) hypertension: Secondary | ICD-10-CM

## 2020-12-30 DIAGNOSIS — Z952 Presence of prosthetic heart valve: Secondary | ICD-10-CM | POA: Diagnosis not present

## 2020-12-30 NOTE — Patient Outreach (Signed)
[facility]) Care Management  [place]  12/30/2020   Lonnie Hansen [birth year] [id number]   Outreach attempt #2, successful.  Denies any urgent concerns, encouraged to contact this care manager with questions.    Encounter Medications:  Outpatient Encounter Medications as of 12/30/2020  Medication Sig   acetaminophen (TYLENOL) 325 MG tablet Take 2 tablets (650 mg total) by mouth every 6 (six) hours as needed for mild pain or fever.   acetaminophen (TYLENOL) 500 MG tablet Take 1,000 mg by mouth every 6 (six) hours as needed for moderate pain.   amLODipine (NORVASC) 10 MG tablet Take 10 mg by mouth every evening.   atenolol (TENORMIN) 25 MG tablet Take 12.5 mg by mouth 2 (two) times daily.   Ensure Plus (ENSURE PLUS) LIQD Take 237 mLs by mouth daily.   ferrous sulfate 325 (65 FE) MG tablet Take 325 mg by mouth 2 (two) times a week.   latanoprost (XALATAN) 0.005 % ophthalmic solution Place 1 drop into both eyes 2 (two) times daily.   Multiple Vitamin (MULTIVITAMIN WITH MINERALS) TABS tablet Take 1 tablet by mouth daily at 12 noon.   ondansetron (ZOFRAN) 4 MG tablet Take 1 tablet (4 mg total) by mouth every 8 (eight) hours as needed for nausea or vomiting.   pantoprazole (PROTONIX) 40 MG tablet Take 40 mg by mouth 2 (two) times daily.   polyethylene glycol (MIRALAX / GLYCOLAX) 17 g packet Take 17 g by mouth daily. (Patient taking differently: Take 17 g by mouth daily as needed for moderate constipation.)   psyllium (HYDROCIL/METAMUCIL) 95 % PACK Take 1 packet by mouth daily.   rosuvastatin (CRESTOR) 10 MG tablet Take 1 tablet by mouth once daily (Patient taking differently: Take 10 mg by mouth every evening.)   traMADol (ULTRAM) 50 MG tablet Take 1 tablet (50 mg total) by mouth every 6 (six) hours as needed.   triamcinolone (KENALOG) 0.025 % cream Apply topically 2 (two) times daily.   triamcinolone cream (KENALOG) 0.1 % Apply 1 application topically 2 (two) times  daily as needed (facial itching/rash).   No facility-administered encounter medications on file as of 12/30/2020.    Functional Status:  In your present state of health, do you have any difficulty performing the following activities: 12/19/2020 05/31/2020  Hearing? N N  Vision? N N  Difficulty concentrating or making decisions? N N  Walking or climbing stairs? Y N  Dressing or bathing? N N  Doing errands, shopping? N -  Some recent data might be hidden    Fall/Depression Screening: Fall Risk  05/30/2020 05/31/2017 12/03/2016  Falls in the past year? 0 No No  Number falls in past yr: 0 - -  Injury with Fall? 0 - -   PHQ 2/9 Scores 05/30/2020 03/15/2017 12/07/2016  PHQ - 2 Score 0 0 0    Assessment:   Care Plan Care Plan : General Plan of Care (Adult)  Updates made by [name], RN since 12/30/2020 12:00 AM     Problem: Knowledge deficit related to management of chronic medical conditions (ITP and HTN)   Priority: High     Long-Range Goal: Patient will verbalize and display chronic health management evidenced by no hospital admissions in the next 6 months   Start Date: 09/19/2020  Expected End Date: 03/20/2021  This Visit's Progress: On track  Recent Progress: On track  Priority: High  Note:   Current Barriers:  Knowledge Deficits related to plan of care for management  of HTN and ITP  Chronic Disease Management support and education needs related to HTN and ITP  RNCM Clinical Goal(s):  Patient will verbalize understanding of plan for management of HTN and ITP take all medications exactly as prescribed and will call provider for medication related questions attend all scheduled medical appointments: PCP, Cardiology, and Hematology continue to work with RN Care Manager to address care management and care coordination needs related to HTN and ITP  through collaboration with RN Care manager, provider, and care team.   Interventions: Inter-dis1:1 collaboration with primary care  provider regarding development and update of comprehensive plan of care as evidenced by provider attestation and co-signatureciplinary care team collaboration (see longitudinal plan of care) Evaluation of current treatment plan related to  self management and patient's adherence to plan as established by provider   Hypertension: (Status: Goal on track: YES.) Last practice recorded BP readings:  BP Readings from Last 3 Encounters:  09/11/20 (!) 144/84  08/26/20 (!) 171/74  08/05/20 (!) 156/88  Most recent eGFR/CrCl: No results found for: EGFR  No components found for: CRCL  Evaluation of current treatment plan related to hypertension self management and patient's adherence to plan as established by provider;   Reviewed prescribed diet low salt Advised patient, providing education and rationale, to monitor blood pressure daily and record, calling PCP for findings outside established parameters;  Discussed complications of poorly controlled blood pressure such as heart disease, stroke, circulatory complications, vision complications, kidney impairment, sexual dysfunction;  Screening for signs and symptoms of depression related to chronic disease state;   11/8 - Admits that he is not consistent with monitoring BP daily.  Strongly encouraged to do so and record readings, state he will try.     ITPGoal on track:  Yes. Evaluation of current treatment plan related to  ITP , self-management and patient's adherence to plan as established by provider. Discussed plans with patient for ongoing care management follow up and provided patient with direct contact information for care management team Advised patient to call provider with any signs of bleeding.  Platetlets in August were 99,000, on 11/3 decreased to 78,000; Provided education to patient re: management of ITP and chronic anemia; Reviewed medications with patient and discussed schedule of follow up, including next set of labs and pending  injection;  11/8 - Member state he has several follow up appointments in the next few weeks.  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  State he is doing well, no complications post op.  Was seen by surgeon on 12/2, no follow up needed.  Will see hematology and cardiology today, last platelet count was 92 and stable.  Patient Goals/Self-Care Activities: Patient will attend all scheduled provider appointments Patient will call pharmacy for medication refills Patient will call provider office for new concerns or questions         Plan:  Follow-up: Patient agrees to Care Plan and Follow-up. Follow-up in 1 month(s).  [name], [place], MSN [place] [phone]

## 2020-12-31 ENCOUNTER — Encounter: Payer: Self-pay | Admitting: Podiatry

## 2020-12-31 ENCOUNTER — Ambulatory Visit (INDEPENDENT_AMBULATORY_CARE_PROVIDER_SITE_OTHER): Payer: Medicare Other | Admitting: Podiatry

## 2020-12-31 DIAGNOSIS — M722 Plantar fascial fibromatosis: Secondary | ICD-10-CM

## 2020-12-31 DIAGNOSIS — M25562 Pain in left knee: Secondary | ICD-10-CM

## 2020-12-31 MED ORDER — TRIAMCINOLONE ACETONIDE 10 MG/ML IJ SUSP
10.0000 mg | Freq: Once | INTRAMUSCULAR | Status: AC
  Administered 2020-12-31: 10 mg

## 2021-01-01 ENCOUNTER — Ambulatory Visit: Payer: Medicare Other | Admitting: *Deleted

## 2021-01-03 NOTE — Progress Notes (Signed)
Subjective: 70 year old male presents the office today for follow evaluation of a soft tissue mass on the bottom of his left foot.  States that it feels about the same as it did previously and he presents today to discuss MRI results.  His main concern is having pain to his left knee.  No injury.  This started about 2 days ago.  No other foot complaints today.  Objective: AAO x3, NAD DP/PT pulses palpable bilaterally, CRT less than 3 seconds On the plantar aspect of the left foot appears to be unchanged.  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  No edema, erythema. 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  We discussed both conservative as well as surgical treatment options.  At this time we decided proceed with a steroid injection.  Discussed prep with alcohol and mixture 1 cc Kenalog 10, 0.5 cc of Marcaine plain, 0.5 cc of lidocaine plain was infiltrated into the soft tissue mass without complications.  Postinjection care discussed.  Tolerated injection well without any complications.  Discussed stretching, offloading.  If no improvement surgical excision, biopsy. -Patient encouraged to call the office with any questions, concerns, change in symptoms.   Trula Slade DPM

## 2021-01-06 DIAGNOSIS — H26492 Other secondary cataract, left eye: Secondary | ICD-10-CM | POA: Diagnosis not present

## 2021-01-06 DIAGNOSIS — H04123 Dry eye syndrome of bilateral lacrimal glands: Secondary | ICD-10-CM | POA: Diagnosis not present

## 2021-01-06 DIAGNOSIS — H401231 Low-tension glaucoma, bilateral, mild stage: Secondary | ICD-10-CM | POA: Diagnosis not present

## 2021-01-06 DIAGNOSIS — H26493 Other secondary cataract, bilateral: Secondary | ICD-10-CM | POA: Diagnosis not present

## 2021-01-07 ENCOUNTER — Ambulatory Visit (INDEPENDENT_AMBULATORY_CARE_PROVIDER_SITE_OTHER): Payer: Medicare Other | Admitting: Orthopaedic Surgery

## 2021-01-07 ENCOUNTER — Ambulatory Visit (INDEPENDENT_AMBULATORY_CARE_PROVIDER_SITE_OTHER): Payer: Medicare Other

## 2021-01-07 ENCOUNTER — Other Ambulatory Visit: Payer: Self-pay

## 2021-01-07 DIAGNOSIS — I2510 Atherosclerotic heart disease of native coronary artery without angina pectoris: Secondary | ICD-10-CM | POA: Diagnosis not present

## 2021-01-07 DIAGNOSIS — M25562 Pain in left knee: Secondary | ICD-10-CM | POA: Diagnosis not present

## 2021-01-07 DIAGNOSIS — M1611 Unilateral primary osteoarthritis, right hip: Secondary | ICD-10-CM | POA: Diagnosis not present

## 2021-01-07 DIAGNOSIS — M1712 Unilateral primary osteoarthritis, left knee: Secondary | ICD-10-CM

## 2021-01-07 MED ORDER — LIDOCAINE HCL 1 % IJ SOLN
3.0000 mL | INTRAMUSCULAR | Status: AC | PRN
Start: 2021-01-07 — End: 2021-01-07
  Administered 2021-01-07: 3 mL

## 2021-01-07 MED ORDER — METHYLPREDNISOLONE ACETATE 40 MG/ML IJ SUSP
40.0000 mg | INTRAMUSCULAR | Status: AC | PRN
Start: 2021-01-07 — End: 2021-01-07
  Administered 2021-01-07: 40 mg via INTRA_ARTICULAR

## 2021-01-07 NOTE — Progress Notes (Signed)
Office Visit Note   Patient: Lonnie Hansen           Date of Birth: 1950/08/27           MRN: [id number] Visit Date: 01/07/2021              Requested by: Trula Slade, DPM [address] [place],  [place] [address] PCP: [name], MD   Assessment & Plan: Visit Diagnoses:  1. Acute pain of left knee   2. Unilateral primary osteoarthritis, left knee   3. Unilateral primary osteoarthritis, right hip     Plan: He does understand he has significant arthritis in his left knee.  I did provide a steroid injection in his left knee today to try to calm down acute pain.  He will eventually need a knee replacement but the more pressing issue is his right hip which has severe end-stage arthritis.  I am ready to replace his hip when he feels medically he is there.  He will let us know in the near future.  Follow-Up Instructions: No follow-ups on file.  The patient will call for follow-up once he finds out more about how his platelets are responding.  He is interested in hip replacement surgery at some point.  He would likely need cardiac clearance.  Orders:  Orders Placed This Encounter  Procedures   Large Joint Inj   XR Knee 1-2 Views Left   No orders of the defined types were placed in this encounter.     Procedures: Large Joint Inj: L knee on 01/07/2021 9:23 AM Indications: diagnostic evaluation and pain Details: 22 G 1.5 in needle, superolateral approach  Arthrogram: No  Medications: 3 mL lidocaine 1 %; 40 mg methylPREDNISolone acetate 40 MG/ML Outcome: tolerated well, no immediate complications Procedure, treatment alternatives, risks and benefits explained, specific risks discussed. Consent was given by the patient. Immediately prior to procedure a time out was called to verify the correct patient, procedure, equipment, support staff and site/side marked as required. Patient was prepped and draped in the usual sterile fashion.      Clinical Data: No  additional findings.   Subjective: Chief Complaint  Patient presents with   Left Knee - Pain  The patient comes in with a weeks worth of left knee pain.  He is a patient seen before.  He has debilitating end-stage arthritis of his right hip and we are going to replace his hip but he had significant cardiac issues that did require surgery.  He has since done well from that but he has platelet issues.  He sees a hematologist for this.  He has a follow-up soon to see how his platelets are responding.  He does ambit with a cane.  He is interested at some point in a right hip replacement.  He would need cardiac clearance.  His previous x-rays showed debilitating end-stage arthritis of the right hip and is really no other treatments for the right hip.  His left knee has been bothering him and is limping due to his left knee and his right hip.  He is using a cane as well.  He cannot take anti-inflammatories.  HPI  Review of Systems Today he denies any headache, chest pain, shortness of breath, fever, chills, nausea, vomiting  Objective: Vital Signs: There were no vitals taken for this visit.  Physical Exam He is alert and orient x3 and in no acute distress Ortho Exam Examination of his left knee shows  valgus malalignment with no effusion.  There is painful arc of motion and mainly pain at the lateral joint line.  His right hip essentially has almost no internal and external rotation. Specialty Comments:  No specialty comments available.  Imaging: XR Knee 1-2 Views Left  Result Date: 01/07/2021 2 views left knee show no acute findings.  There is tricompartment arthritis with valgus malalignment and bone-on-bone wear of the lateral compartment.    PMFS History: Patient Active Problem List   Diagnosis Date Noted   Cholecystitis 12/18/2020   Hemorrhagic shock ([place])    AKI (acute kidney injury) ([place])    Cardiac arrest ([place])    GIB (gastrointestinal bleeding) 05/24/2020   GI bleed  04/15/2019   Rectal bleeding 04/14/2019   Acute blood loss anemia 04/14/2019   Acute GI bleeding 04/14/2019   Unilateral primary osteoarthritis, 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   OSA on CPAP 03/04/2017   Vocal cord nodule    Sleep apnea    Nocturia    Joint swelling    Joint pain    Hypertension    History of kidney stones    History of colon polyps    Headache    Glaucoma    GERD (gastroesophageal reflux disease)    Enlarged prostate    Enlarged aorta (HCC)    Diverticulosis    Coronary artery disease    Cataracts, bilateral    Bruising    Blood dyscrasia    Arthritis    Anemia    Hydropneumothorax 10/29/2016   S/P AVR 09/15/2016   Coronary artery disease involving native coronary artery of native heart without angina pectoris 04/29/2016   Aortic insufficiency 04/01/2016   Thoracic aortic aneurysm 09/18/2015   HTN (hypertension) 09/18/2015   Hyperlipidemia 09/18/2015   Asymptomatic bilateral carotid artery stenosis 08/27/2015   Past Medical History:  Diagnosis Date   AAA (abdominal aortic aneurysm)    Anemia    Arthritis    Asymptomatic bilateral carotid artery stenosis 08/2015   1-39%    Cataracts, bilateral    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   Coronary artery disease    Diverticulosis    Enlarged aorta (HCC)    Enlarged prostate    slightly   GERD (gastroesophageal reflux disease)    takes Pantoprazole daily as needed   Glaucoma    uses eye drops daily   Headache    History of colon polyps    benign   History of kidney stones    Hyperlipidemia    no on any meds   Hypertension    takes Amlodipine and Atenolol daily   OSA on CPAP    Vocal cord nodule    pt. states  it's a" growth on vocal cord"    Family History  Problem Relation Age of Onset   Heart disease Father    Heart attack Father    Thyroid disease Sister     Past Surgical History:  Procedure Laterality Date    AORTIC ARCH ANGIOGRAPHY N/A 04/16/2016   Procedure: Aortic Arch Angiography;  Surgeon: [name] Martinique, MD;  Location: [place] CV [facility];  Service: Cardiovascular;  Laterality: N/A;   AORTIC VALVE REPLACEMENT N/A 09/15/2016   Procedure: AORTIC VALVE REPLACEMENT (AVR);  Surgeon: Grace Isaac, MD;  Location: [place];  Service: Open Heart Surgery;  Laterality: N/A;  Using 81mm Edwards Perimount Magna Ease Aortic Bioprosthesis Valve   ASCENDING AORTIC ROOT REPLACEMENT N/A 09/15/2016  Procedure: ASCENDING AORTIC ROOT REPLACEMENT;  Surgeon: Grace Isaac, MD;  Location: [place];  Service: Open Heart Surgery;  Laterality: N/A;  Using 70mm Gelweave Valsalva Graft   CHOLECYSTECTOMY N/A 12/19/2020   Procedure: LAPAROSCOPIC CHOLECYSTECTOMY WITH POSSIBLE INTRAOPERATIVE CHOLANGIOGRAM;  Surgeon: [name], MD;  Location: [place];  Service: General;  Laterality: N/A;   COLONOSCOPY     COLONOSCOPY Left 04/16/2019   Procedure: COLONOSCOPY;  Surgeon: Arta Silence, MD;  Location: [facility];  Service: Endoscopy;  Laterality: Left;   COLONOSCOPY N/A 06/01/2020   Procedure: COLONOSCOPY;  Surgeon: Ronnette Juniper, MD;  Location: [facility];  Service: Gastroenterology;  Laterality: N/A;   COLONOSCOPY WITH ESOPHAGOGASTRODUODENOSCOPY (EGD)     ESOPHAGOGASTRODUODENOSCOPY N/A 05/25/2020   Procedure: ESOPHAGOGASTRODUODENOSCOPY (EGD);  Surgeon: Wilford Corner, MD;  Location: [place];  Service: Endoscopy;  Laterality: N/A;   ESOPHAGOGASTRODUODENOSCOPY (EGD) WITH PROPOFOL Left 04/16/2019   Procedure: ESOPHAGOGASTRODUODENOSCOPY (EGD) WITH PROPOFOL;  Surgeon: Arta Silence, MD;  Location: [facility] ENDOSCOPY;  Service: Endoscopy;  Laterality: Left;   FLEXIBLE SIGMOIDOSCOPY N/A 05/25/2020   Procedure: FLEXIBLE SIGMOIDOSCOPY;  Surgeon: Wilford Corner, MD;  Location: [place];  Service: Endoscopy;  Laterality: N/A;   IR THORACENTESIS ASP PLEURAL SPACE W/IMG GUIDE  10/23/2016   MICROLARYNGOSCOPY Right 09/20/2017    Procedure: MICROLARYNGOSCOPY WITH  EXCISION OF VOCAL CORD LESION;  Surgeon: Leta Baptist, MD;  Location: [place];  Service: ENT;  Laterality: Right;   MULTIPLE EXTRACTIONS WITH ALVEOLOPLASTY N/A 06/10/2016   Procedure: Extraction of tooth #'s 2,8,13,15, and 29  with alveoloplasty, maxillary right and left buccal exostoses reductions, and gross debridement of remaining teeth.;  Surgeon: Lenn Cal, DDS;  Location: [facility];  Service: Oral Surgery;  Laterality: N/A;   RIGHT/LEFT HEART CATH AND CORONARY ANGIOGRAPHY N/A 04/16/2016   Procedure: Right/Left Heart Cath and Coronary Angiography;  Surgeon: [name] Martinique, MD;  Location: [place] [facility];  Service: Cardiovascular;  Laterality: N/A;   TEE WITHOUT CARDIOVERSION N/A 09/15/2016   Procedure: TRANSESOPHAGEAL ECHOCARDIOGRAM (TEE);  Surgeon: Grace Isaac, MD;  Location: [facility];  Service: Open Heart Surgery;  Laterality: N/A;   Social History   Occupational History   Occupation: Musician  Tobacco Use   Smoking status: Former    Packs/day: 1.00    Years: 25.00    Pack years: 25.00    Types: Cigarettes   Smokeless tobacco: Never  Vaping Use   Vaping Use: Never used  Substance and Sexual Activity   Alcohol use: No    Alcohol/week: 0.0 standard drinks   Drug use: No   Sexual activity: Not on file

## 2021-01-24 DIAGNOSIS — I2510 Atherosclerotic heart disease of native coronary artery without angina pectoris: Secondary | ICD-10-CM | POA: Diagnosis not present

## 2021-01-24 DIAGNOSIS — I10 Essential (primary) hypertension: Secondary | ICD-10-CM | POA: Diagnosis not present

## 2021-01-24 DIAGNOSIS — E785 Hyperlipidemia, unspecified: Secondary | ICD-10-CM | POA: Diagnosis not present

## 2021-01-28 ENCOUNTER — Other Ambulatory Visit: Payer: Self-pay

## 2021-01-28 ENCOUNTER — Inpatient Hospital Stay (HOSPITAL_BASED_OUTPATIENT_CLINIC_OR_DEPARTMENT_OTHER): Payer: Medicare Other | Admitting: Family

## 2021-01-28 ENCOUNTER — Encounter: Payer: Self-pay | Admitting: Family

## 2021-01-28 ENCOUNTER — Inpatient Hospital Stay: Payer: Medicare Other

## 2021-01-28 ENCOUNTER — Inpatient Hospital Stay: Payer: Medicare Other | Attending: Hematology & Oncology

## 2021-01-28 ENCOUNTER — Ambulatory Visit: Payer: Medicare Other | Attending: Internal Medicine

## 2021-01-28 VITALS — BP 148/85 | HR 67 | Temp 99.2°F | Resp 19 | Ht 68.0 in | Wt 209.0 lb

## 2021-01-28 DIAGNOSIS — D693 Immune thrombocytopenic purpura: Secondary | ICD-10-CM

## 2021-01-28 DIAGNOSIS — Z79899 Other long term (current) drug therapy: Secondary | ICD-10-CM | POA: Diagnosis not present

## 2021-01-28 DIAGNOSIS — R1011 Right upper quadrant pain: Secondary | ICD-10-CM | POA: Diagnosis not present

## 2021-01-28 DIAGNOSIS — M25562 Pain in left knee: Secondary | ICD-10-CM | POA: Insufficient documentation

## 2021-01-28 DIAGNOSIS — Z23 Encounter for immunization: Secondary | ICD-10-CM

## 2021-01-28 DIAGNOSIS — Z9049 Acquired absence of other specified parts of digestive tract: Secondary | ICD-10-CM | POA: Diagnosis not present

## 2021-01-28 LAB — CMP (CANCER CENTER ONLY)
ALT: 10 U/L (ref 0–44)
AST: 16 U/L (ref 15–41)
Albumin: 4.5 g/dL (ref 3.5–5.0)
Alkaline Phosphatase: 51 U/L (ref 38–126)
Anion gap: 6 (ref 5–15)
BUN: 15 mg/dL (ref 8–23)
CO2: 31 mmol/L (ref 22–32)
Calcium: 9.7 mg/dL (ref 8.9–10.3)
Chloride: 103 mmol/L (ref 98–111)
Creatinine: 1.11 mg/dL (ref 0.61–1.24)
GFR, Estimated: >60 mL/min (ref >60)
Glucose, Bld: 77 mg/dL (ref 70–99)
Potassium: 4 mmol/L (ref 3.5–5.1)
Sodium: 140 mmol/L (ref 135–145)
Total Bilirubin: 0.6 mg/dL (ref 0.3–1.2)
Total Protein: 7 g/dL (ref 6.5–8.1)

## 2021-01-28 LAB — CBC WITH DIFFERENTIAL (CANCER CENTER ONLY)
Abs Immature Granulocytes: 0.04 10*3/uL (ref 0.00–0.07)
Basophils Absolute: 0 10*3/uL (ref 0.0–0.1)
Basophils Relative: 0 %
Eosinophils Absolute: 0.3 10*3/uL (ref 0.0–0.5)
Eosinophils Relative: 4 %
HCT: 38.1 % — ABNORMAL LOW (ref 39.0–52.0)
Hemoglobin: 12.8 g/dL — ABNORMAL LOW (ref 13.0–17.0)
Immature Granulocytes: 1 %
Lymphocytes Relative: 21 %
Lymphs Abs: 1.3 10*3/uL (ref 0.7–4.0)
MCH: 31.6 pg (ref 26.0–34.0)
MCHC: 33.6 g/dL (ref 30.0–36.0)
MCV: 94.1 fL (ref 80.0–100.0)
Monocytes Absolute: 0.8 10*3/uL (ref 0.1–1.0)
Monocytes Relative: 12 %
Neutro Abs: 3.8 10*3/uL (ref 1.7–7.7)
Neutrophils Relative %: 62 %
Platelet Count: 97 10*3/uL — ABNORMAL LOW (ref 150–400)
RBC: 4.05 MIL/uL — ABNORMAL LOW (ref 4.22–5.81)
RDW: 13.3 % (ref 11.5–15.5)
WBC Count: 6.2 10*3/uL (ref 4.0–10.5)
nRBC: 0 % (ref 0.0–0.2)

## 2021-01-28 LAB — SAVE SMEAR(SSMR), FOR PROVIDER SLIDE REVIEW

## 2021-01-28 MED ORDER — ROMIPLOSTIM 125 MCG ~~LOC~~ SOLR
1.0000 ug/kg | Freq: Once | SUBCUTANEOUS | Status: AC
  Administered 2021-01-28: 95 ug via SUBCUTANEOUS
  Filled 2021-01-28: qty 0.19

## 2021-01-28 NOTE — Patient Instructions (Signed)
Romiplostim injection ?What is this medication? ?ROMIPLOSTIM (roe mi PLOE stim) helps your body make more platelets. This medicine is used to treat low platelets caused by chronic idiopathic thrombocytopenic purpura (ITP) or a bone marrow syndrome caused by radiation sickness. ?This medicine may be used for other purposes; ask your health care provider or pharmacist if you have questions. ?COMMON BRAND NAME(S): Nplate ?What should I tell my care team before I take this medication? ?They need to know if you have any of these conditions: ?blood clots ?myelodysplastic syndrome ?an unusual or allergic reaction to romiplostim, mannitol, other medicines, foods, dyes, or preservatives ?pregnant or trying to get pregnant ?breast-feeding ?How should I use this medication? ?This medicine is injected under the skin. It is given by a health care provider in a hospital or clinic setting. ?A special MedGuide will be given to you before each treatment. Be sure to read this information carefully each time. ?Talk to your health care provider about the use of this medicine in children. While it may be prescribed for children as young as newborns for selected conditions, precautions do apply. ?Overdosage: If you think you have taken too much of this medicine contact a poison control center or emergency room at once. ?NOTE: This medicine is only for you. Do not share this medicine with others. ?What if I miss a dose? ?Keep appointments for follow-up doses. It is important not to miss your dose. Call your health care provider if you are unable to keep an appointment. ?What may interact with this medication? ?Interactions are not expected. ?This list may not describe all possible interactions. Give your health care provider a list of all the medicines, herbs, non-prescription drugs, or dietary supplements you use. Also tell them if you smoke, drink alcohol, or use illegal drugs. Some items may interact with your medicine. ?What should I  watch for while using this medication? ?Visit your health care provider for regular checks on your progress. You may need blood work done while you are taking this medicine. Your condition will be monitored carefully while you are receiving this medicine. It is important not to miss any appointments. ?What side effects may I notice from receiving this medication? ?Side effects that you should report to your doctor or health care professional as soon as possible: ?allergic reactions (skin rash, itching or hives; swelling of the face, lips, or tongue) ?bleeding (bloody or black, tarry stools; red or dark brown urine; spitting up blood or brown material that looks like coffee grounds; red spots on the skin; unusual bruising or bleeding from the eyes, gums, or nose) ?blood clot (chest pain; shortness of breath; pain, swelling, or warmth in the leg) ?stroke (changes in vision; confusion; trouble speaking or understanding; severe headaches; sudden numbness or weakness of the face, arm or leg; trouble walking; dizziness; loss of balance or coordination) ?Side effects that usually do not require medical attention (report to your doctor or health care professional if they continue or are bothersome): ?diarrhea ?dizziness ?headache ?joint pain ?muscle pain ?stomach pain ?trouble sleeping ?This list may not describe all possible side effects. Call your doctor for medical advice about side effects. You may report side effects to FDA at 1-800-FDA-1088. ?Where should I keep my medication? ?This medicine is given in a hospital or clinic. It will not be stored at home. ?NOTE: This sheet is a summary. It may not cover all possible information. If you have questions about this medicine, talk to your doctor, pharmacist, or health care provider. ??   2022 Elsevier/Gold Standard (2020-10-01 00:00:00) ? ?

## 2021-01-28 NOTE — Progress Notes (Signed)
° °  Covid-19 Vaccination Clinic  Name:  Lonnie Hansen    MRN: [id number] DOB: January 11, 1951  01/28/2021  [name] was observed post Covid-19 immunization for 15 minutes without incident. He was provided with Vaccine Information Sheet and instruction to access the V-Safe system.   Mr. Los was instructed to call 911 with any severe reactions post vaccine: Difficulty breathing  Swelling of face and throat  A fast heartbeat  A bad rash all over body  Dizziness and weakness   Immunizations Administered     Name Date Dose VIS Date Route   Pfizer Covid-19 Vaccine Bivalent Booster 01/28/2021 12:41 PM 0.3 mL 09/25/2020 Intramuscular   Manufacturer: [place]   Lot: [id number]   [place]: [phone]

## 2021-01-28 NOTE — Progress Notes (Signed)
Hematology and Oncology Follow Up Visit  Lonnie Hansen [id number] 1950-03-31 71 y.o. 01/28/2021   Principle Diagnosis:  Chronic ITP   Current Therapy:        Weekly Nplate to keep platelet count over 100,000   Interim History:  Lonnie Hansen is here today for follow-up and injection. He is doing well and is recuperating from having his gallbladder removed in November. His lap sites have healed nicely. He states that he tolerated the procedure well and did not require any blood products.  He has the occasional twinge of pain in the right upper quadrant with activity.  He has started working out at [employer] again but has been doing light exercise.  He has right hip and left knee pain and states that he needs a right hip replacement in the future.  No fever, chills, n/v, cough, rash, dizziness, SOB, chest pain, palpitations, abdominal pain or changes in bowel or bladder habits.  He has not noted any blood loss. No bruising or petechiae.  No swelling, numbness or tingling in his extremities.  No falls or syncope to report. He ambulates with a cane for added support.  He has maintained a good appetite and is staying well hydrated. His weight is stable at 209 lbs.   ECOG Performance Status: 1 - Symptomatic but completely ambulatory  Medications:  Allergies as of 01/28/2021   No Known Allergies      Medication List        Accurate as of January 28, 2021 10:59 AM. If you have any questions, ask your nurse or doctor.          acetaminophen 500 MG tablet Commonly known as: TYLENOL Take 1,000 mg by mouth every 6 (six) hours as needed for moderate pain.   acetaminophen 325 MG tablet Commonly known as: TYLENOL Take 2 tablets (650 mg total) by mouth every 6 (six) hours as needed for mild pain or fever.   amLODipine 10 MG tablet Commonly known as: NORVASC Take 10 mg by mouth every evening.   atenolol 25 MG tablet Commonly known as: TENORMIN Take 12.5 mg by mouth 2 (two) times  daily.   Ensure Plus Liqd Take 237 mLs by mouth daily.   ferrous sulfate 325 (65 FE) MG tablet Take 325 mg by mouth 2 (two) times a week.   latanoprost 0.005 % ophthalmic solution Commonly known as: XALATAN Place 1 drop into both eyes 2 (two) times daily.   multivitamin with minerals Tabs tablet Take 1 tablet by mouth daily at 12 noon.   ondansetron 4 MG tablet Commonly known as: Zofran Take 1 tablet (4 mg total) by mouth every 8 (eight) hours as needed for nausea or vomiting.   pantoprazole 40 MG tablet Commonly known as: PROTONIX Take 40 mg by mouth 2 (two) times daily.   polyethylene glycol 17 g packet Commonly known as: MIRALAX / GLYCOLAX Take 17 g by mouth daily. What changed:  when to take this reasons to take this   psyllium 95 % Pack Commonly known as: HYDROCIL/METAMUCIL Take 1 packet by mouth daily.   rosuvastatin 10 MG tablet Commonly known as: CRESTOR Take 1 tablet by mouth once daily What changed: when to take this   traMADol 50 MG tablet Commonly known as: ULTRAM Take 1 tablet (50 mg total) by mouth every 6 (six) hours as needed.   triamcinolone cream 0.1 % Commonly known as: KENALOG Apply 1 application topically 2 (two) times daily as needed (facial itching/rash).  triamcinolone 0.025 % cream Commonly known as: KENALOG Apply topically 2 (two) times daily.        Allergies: No Known Allergies  Past Medical History, Surgical history, Social history, and Family History were reviewed and updated.  Review of Systems: All other 10 point review of systems is negative.   Physical Exam:  vitals were not taken for this visit.   Wt Readings from Last 3 Encounters:  12/30/20 206 lb 9.6 oz (93.7 kg)  12/18/20 212 lb (96.2 kg)  12/11/20 212 lb (96.2 kg)    Ocular: Sclerae unicteric, pupils equal, round and reactive to light Ear-nose-throat: Oropharynx clear, dentition fair Lymphatic: No cervical or supraclavicular adenopathy Lungs no rales or  rhonchi, good excursion bilaterally Heart regular rate and rhythm, no murmur appreciated Abd soft, nontender, positive bowel sounds MSK no focal spinal tenderness, no joint edema Neuro: non-focal, well-oriented, appropriate affect Breasts: Deferred   Lab Results  Component Value Date   WBC 7.6 12/19/2020   HGB 12.1 (L) 12/19/2020   HCT 35.9 (L) 12/19/2020   MCV 92.1 12/19/2020   PLT 92 (L) 12/19/2020   No results found for: FERRITIN, IRON, TIBC, UIBC, IRONPCTSAT Lab Results  Component Value Date   RBC 3.90 (L) 12/19/2020   No results found for: KPAFRELGTCHN, LAMBDASER, KAPLAMBRATIO No results found for: IGGSERUM, IGA, IGMSERUM No results found for: Odetta Pink, SPEI   Chemistry      Component Value Date/Time   NA 137 12/19/2020 0047   NA 140 05/11/2019 0946   K 3.5 12/19/2020 0047   CL 102 12/19/2020 0047   CO2 25 12/19/2020 0047   BUN 14 12/19/2020 0047   BUN 17 05/11/2019 0946   CREATININE 1.11 12/19/2020 0047   CREATININE 1.16 11/28/2020 0953   CREATININE 0.75 04/10/2016 1132      Component Value Date/Time   CALCIUM 8.8 (L) 12/19/2020 0047   ALKPHOS 45 12/19/2020 0047   AST 20 12/19/2020 0047   AST 19 11/28/2020 0953   ALT 14 12/19/2020 0047   ALT 12 11/28/2020 0953   BILITOT 1.0 12/19/2020 0047   BILITOT 0.7 11/28/2020 0953       Impression and Plan: Lonnie Hansen is a very pleasant 71 yo African American gentleman with chronic ITP.  He received Nplate today for platelet count 97.  Follow-up in 8 weeks.   Lottie Dawson, NP 1/3/202310:59 AM

## 2021-01-29 ENCOUNTER — Other Ambulatory Visit: Payer: Self-pay | Admitting: *Deleted

## 2021-01-29 NOTE — Patient Outreach (Signed)
[facility]) Care Management  01/29/2021  Lonnie Hansen [birth year] [id number]   Outgoing call placed to member, no answer, HIPAA compliant voice message left.  Will follow up within the next 3-4 business days.  [name], RN, MSN, [place] Manager [phone]

## 2021-01-30 ENCOUNTER — Encounter: Payer: Self-pay | Admitting: Hematology & Oncology

## 2021-01-30 ENCOUNTER — Other Ambulatory Visit (HOSPITAL_BASED_OUTPATIENT_CLINIC_OR_DEPARTMENT_OTHER): Payer: Self-pay

## 2021-01-30 ENCOUNTER — Telehealth: Payer: Self-pay | Admitting: *Deleted

## 2021-01-30 DIAGNOSIS — Z23 Encounter for immunization: Secondary | ICD-10-CM | POA: Diagnosis not present

## 2021-01-30 MED ORDER — PFIZER COVID-19 VAC BIVALENT 30 MCG/0.3ML IM SUSP
INTRAMUSCULAR | 0 refills | Status: DC
  Filled 2021-01-30: qty 0.3, 1d supply, fill #0

## 2021-01-30 NOTE — Telephone Encounter (Signed)
Per 01/28/21 los - called and gave upcoming appointments - confirmed

## 2021-02-03 ENCOUNTER — Other Ambulatory Visit: Payer: Self-pay | Admitting: *Deleted

## 2021-02-03 DIAGNOSIS — I10 Essential (primary) hypertension: Secondary | ICD-10-CM | POA: Diagnosis not present

## 2021-02-03 DIAGNOSIS — Z125 Encounter for screening for malignant neoplasm of prostate: Secondary | ICD-10-CM | POA: Diagnosis not present

## 2021-02-03 DIAGNOSIS — E785 Hyperlipidemia, unspecified: Secondary | ICD-10-CM | POA: Diagnosis not present

## 2021-02-03 NOTE — Patient Outreach (Signed)
[facility]) Care Management  02/03/2021  TARIS GALINDO 31-Aug-1950 [id number]   Outreach attempt #2, unsuccessful, HIPAA compliant voice message left.  Will send outreach letter and follow up within the next 3-4 business days.    [name], RN, MSN, [place] Manager [phone]

## 2021-02-06 ENCOUNTER — Other Ambulatory Visit: Payer: Self-pay | Admitting: *Deleted

## 2021-02-06 NOTE — Patient Outreach (Signed)
[facility]) Care Management  [place]  02/06/2021   Lonnie Hansen 1951-01-10 [id number]    Outreach attempt #2, successful.  Denies any urgent concerns, encouraged to contact this care manager with questions.    Encounter Medications:  Outpatient Encounter Medications as of 02/06/2021  Medication Sig   acetaminophen (TYLENOL) 325 MG tablet Take 2 tablets (650 mg total) by mouth every 6 (six) hours as needed for mild pain or fever.   acetaminophen (TYLENOL) 500 MG tablet Take 1,000 mg by mouth every 6 (six) hours as needed for moderate pain.   amLODipine (NORVASC) 10 MG tablet Take 10 mg by mouth every evening.   atenolol (TENORMIN) 25 MG tablet Take 12.5 mg by mouth 2 (two) times daily.   COVID-19 mRNA bivalent vaccine, Pfizer, (PFIZER COVID-19 VAC BIVALENT) injection Inject into the muscle.   Ensure Plus (ENSURE PLUS) LIQD Take 237 mLs by mouth daily.   ferrous sulfate 325 (65 FE) MG tablet Take 325 mg by mouth 2 (two) times a week.   latanoprost (XALATAN) 0.005 % ophthalmic solution Place 1 drop into both eyes 2 (two) times daily.   Multiple Vitamin (MULTIVITAMIN WITH MINERALS) TABS tablet Take 1 tablet by mouth daily at 12 noon.   ondansetron (ZOFRAN) 4 MG tablet Take 1 tablet (4 mg total) by mouth every 8 (eight) hours as needed for nausea or vomiting.   pantoprazole (PROTONIX) 40 MG tablet Take 40 mg by mouth 2 (two) times daily.   polyethylene glycol (MIRALAX / GLYCOLAX) 17 g packet Take 17 g by mouth daily. (Patient taking differently: Take 17 g by mouth daily as needed for moderate constipation.)   psyllium (HYDROCIL/METAMUCIL) 95 % PACK Take 1 packet by mouth daily.   rosuvastatin (CRESTOR) 10 MG tablet Take 1 tablet by mouth once daily (Patient taking differently: Take 10 mg by mouth every evening.)   traMADol (ULTRAM) 50 MG tablet Take 1 tablet (50 mg total) by mouth every 6 (six) hours as needed.   triamcinolone (KENALOG) 0.025 % cream Apply  topically 2 (two) times daily.   triamcinolone cream (KENALOG) 0.1 % Apply 1 application topically 2 (two) times daily as needed (facial itching/rash).   No facility-administered encounter medications on file as of 02/06/2021.    Functional Status:  In your present state of health, do you have any difficulty performing the following activities: 12/19/2020 05/31/2020  Hearing? N N  Vision? N N  Difficulty concentrating or making decisions? N N  Walking or climbing stairs? Y N  Dressing or bathing? N N  Doing errands, shopping? N -  Some recent data might be hidden    Fall/Depression Screening: Fall Risk  05/30/2020 05/31/2017 12/03/2016  Falls in the past year? 0 No No  Number falls in past yr: 0 - -  Injury with Fall? 0 - -   PHQ 2/9 Scores 05/30/2020 03/15/2017 12/07/2016  PHQ - 2 Score 0 0 0    Assessment:   Care Plan Care Plan : General Plan of Care (Adult)  Updates made by [name], RN since 02/06/2021 12:00 AM     Problem: Knowledge deficit related to management of chronic medical conditions (ITP and HTN)   Priority: High     Long-Range Goal: Patient will verbalize and display chronic health management evidenced by no hospital admissions in the next 6 months   Start Date: 09/19/2020  Expected End Date: 03/20/2021  This Visit's Progress: On track  Recent Progress: On track  Priority:  High  Note:   Current Barriers:  Knowledge Deficits related to plan of care for management of HTN and ITP  Chronic Disease Management support and education needs related to HTN and ITP  RNCM Clinical Goal(s):  Patient will verbalize understanding of plan for management of HTN and ITP take all medications exactly as prescribed and will call provider for medication related questions attend all scheduled medical appointments: PCP, Cardiology, and Hematology continue to work with RN Care Manager to address care management and care coordination needs related to HTN and ITP  through collaboration  with RN Care manager, provider, and care team.   Interventions: Inter-dis1:1 collaboration with primary care provider regarding development and update of comprehensive plan of care as evidenced by provider attestation and co-signatureciplinary care team collaboration (see longitudinal plan of care) Evaluation of current treatment plan related to  self management and patient's adherence to plan as established by provider   Hypertension: (Status: Goal on track: YES.) Last practice recorded BP readings:  BP Readings from Last 3 Encounters:  09/11/20 (!) 144/84  08/26/20 (!) 171/74  08/05/20 (!) 156/88  Most recent eGFR/CrCl: No results found for: EGFR  No components found for: CRCL  Evaluation of current treatment plan related to hypertension self management and patient's adherence to plan as established by provider;   Reviewed prescribed diet low salt Advised patient, providing education and rationale, to monitor blood pressure daily and record, calling PCP for findings outside established parameters;  Discussed complications of poorly controlled blood pressure such as heart disease, stroke, circulatory complications, vision complications, kidney impairment, sexual dysfunction;  Screening for signs and symptoms of depression related to chronic disease state;   11/8 - Admits that he is not consistent with monitoring BP daily.  Strongly encouraged to do so and record readings, state he will try.     ITPGoal on track:  Yes. Evaluation of current treatment plan related to  ITP , self-management and patient's adherence to plan as established by provider. Discussed plans with patient for ongoing care management follow up and provided patient with direct contact information for care management team Advised patient to call provider with any signs of bleeding.  Platetlets in August were 99,000, on 11/3 decreased to 78,000; Provided education to patient re: management of ITP and chronic  anemia; Reviewed medications with patient and discussed schedule of follow up, including next set of labs and pending injection;  11/8 - Member state he has several follow up appointments in the next few weeks.  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  State he is doing well, no complications post op.  Was seen by surgeon on 12/2, no follow up needed.  Will see hematology and cardiology today, last platelet count was 92 and stable.  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  Will have appointment with PCP next week, will contact this care manager with any concerns.  Plan:  Follow-up: Patient agrees to Care Plan and Follow-up. Follow-up in 1 month(s).  [name], [place], MSN [place] [phone]

## 2021-02-10 DIAGNOSIS — Z0000 Encounter for general adult medical examination without abnormal findings: Secondary | ICD-10-CM | POA: Diagnosis not present

## 2021-02-10 DIAGNOSIS — R3121 Asymptomatic microscopic hematuria: Secondary | ICD-10-CM | POA: Diagnosis not present

## 2021-02-10 DIAGNOSIS — G4733 Obstructive sleep apnea (adult) (pediatric): Secondary | ICD-10-CM | POA: Diagnosis not present

## 2021-02-10 DIAGNOSIS — Z1212 Encounter for screening for malignant neoplasm of rectum: Secondary | ICD-10-CM | POA: Diagnosis not present

## 2021-02-10 DIAGNOSIS — E785 Hyperlipidemia, unspecified: Secondary | ICD-10-CM | POA: Diagnosis not present

## 2021-02-10 DIAGNOSIS — I2510 Atherosclerotic heart disease of native coronary artery without angina pectoris: Secondary | ICD-10-CM | POA: Diagnosis not present

## 2021-02-10 DIAGNOSIS — Z952 Presence of prosthetic heart valve: Secondary | ICD-10-CM | POA: Diagnosis not present

## 2021-02-10 DIAGNOSIS — I10 Essential (primary) hypertension: Secondary | ICD-10-CM | POA: Diagnosis not present

## 2021-02-10 DIAGNOSIS — I700 Atherosclerosis of aorta: Secondary | ICD-10-CM | POA: Diagnosis not present

## 2021-02-10 DIAGNOSIS — R82998 Other abnormal findings in urine: Secondary | ICD-10-CM | POA: Diagnosis not present

## 2021-02-10 DIAGNOSIS — M25551 Pain in right hip: Secondary | ICD-10-CM | POA: Diagnosis not present

## 2021-02-10 DIAGNOSIS — D693 Immune thrombocytopenic purpura: Secondary | ICD-10-CM | POA: Diagnosis not present

## 2021-02-23 DIAGNOSIS — I2510 Atherosclerotic heart disease of native coronary artery without angina pectoris: Secondary | ICD-10-CM | POA: Diagnosis not present

## 2021-02-23 DIAGNOSIS — I10 Essential (primary) hypertension: Secondary | ICD-10-CM | POA: Diagnosis not present

## 2021-02-23 DIAGNOSIS — E785 Hyperlipidemia, unspecified: Secondary | ICD-10-CM | POA: Diagnosis not present

## 2021-03-07 ENCOUNTER — Other Ambulatory Visit: Payer: Self-pay | Admitting: *Deleted

## 2021-03-07 NOTE — Patient Instructions (Signed)
Visit Information  Thank you for taking time to visit with me today. Please don't hesitate to contact me if I can be of assistance to you before our next scheduled telephone appointment.  Following are the goals we discussed today:  Adhere to low cholesterol diet.  Our next appointment is by telephone in 1 month  The patient verbalized understanding of instructions, educational materials, and care plan provided today and agreed to receive a mailed copy of patient instructions, educational materials, and care plan.   The patient has been provided with contact information for the care management team and has been advised to call with any health related questions or concerns.   [name], RN, MSN, [place] Manager [phone]

## 2021-03-07 NOTE — Patient Outreach (Signed)
[facility]) Care Management Telephonic RN Care Manager Note   03/07/2021 Name:  Lonnie Hansen MRN:  [id number] DOB:  04-Jan-1951  Summary: Lonnie Hansen call placed to member, successful.  Denies any urgent concerns, encouraged to contact this care manager with questions.    Recommendations/Changes made from today's visit: Follow low cholesterol diet.  Subjective: Lonnie Hansen is an 71 y.o. year old male who is a primary patient of Lonnie Fillers, MD. The care management team was consulted for assistance with care management and/or care coordination needs.    Telephonic RN Care Manager completed Telephone Visit today.  Objective:   Medications Reviewed Today     Reviewed by Andi Hence, RN (Registered Nurse) on 01/28/21 at 1111  Med List Status: <None>   Medication Order Taking? Sig Documenting Provider Last Dose Status Informant  acetaminophen (TYLENOL) 325 MG tablet [id number] Yes Take 2 tablets (650 mg total) by mouth every 6 (six) hours as needed for mild pain or fever. Lonnie Champagne, NP Taking Active Self  acetaminophen (TYLENOL) 500 MG tablet [id number] Yes Take 1,000 mg by mouth every 6 (six) hours as needed for moderate pain. [provider] Taking Active Self  amLODipine (NORVASC) 10 MG tablet [id number] Yes Take 10 mg by mouth every evening. [provider] Taking Active Self  atenolol (TENORMIN) 25 MG tablet [id number] Yes Take 12.5 mg by mouth 2 (two) times daily. Lonnie Lex, MD Taking Active Self  Ensure Plus (ENSURE PLUS) [name] [id number] Yes Take 237 mLs by mouth daily. [provider] Taking Active Self  ferrous sulfate 325 (65 FE) MG tablet [id number] Yes Take 325 mg by mouth 2 (two) times a week. [provider] Taking Active Self  latanoprost (XALATAN) 0.005 % ophthalmic solution [id number] Yes Place 1 drop into both eyes 2 (two) times daily. [provider] Taking Active Self  Multiple Vitamin  (MULTIVITAMIN WITH MINERALS) TABS tablet [id number] Yes Take 1 tablet by mouth daily at 12 noon. [provider] Taking Active Self           Med Note Lonnie Hansen, [facility]   Tue Oct 06, 2016 10:16 AM)    ondansetron (ZOFRAN) 4 MG tablet [id number]  Take 1 tablet (4 mg total) by mouth every 8 (eight) hours as needed for nausea or vomiting. Lonnie Captain, PA-C  Active   pantoprazole (PROTONIX) 40 MG tablet [id number] Yes Take 40 mg by mouth 2 (two) times daily. [provider] Taking Active Self  polyethylene glycol (MIRALAX / GLYCOLAX) 17 g packet [id number] Yes Take 17 g by mouth daily.  Patient taking differently: Take 17 g by mouth daily as needed for moderate constipation.   Lonnie Mody, MD Taking Active Self  psyllium (HYDROCIL/METAMUCIL) 95 % PACK [id number] Yes Take 1 packet by mouth daily. Lonnie Mody, MD Taking Active Self  rosuvastatin (CRESTOR) 10 MG tablet [id number] Yes Take 1 tablet by mouth once daily  Patient taking differently: Take 10 mg by mouth every evening.   [place], [name], MD Taking Active Self  traMADol Lonnie Hansen) 50 MG tablet [id number] Yes Take 1 tablet (50 mg total) by mouth every 6 (six) hours as needed. Lonnie Captain, PA-C Taking Active   triamcinolone (KENALOG) 0.025 % cream [id number] Yes Apply topically 2 (two) times daily. [provider] Taking Active Self  triamcinolone cream (KENALOG) 0.1 % [id number] Yes Apply 1 application topically 2 (two) times daily as needed (facial itching/rash). [provider] Taking Active Self  SDOH:  (Social Determinants of Health) assessments and interventions performed:     Care Plan  Review of patient past medical history, allergies, medications, health status, including review of consultants reports, laboratory and other test data, was performed as part of comprehensive evaluation for care management services.   Care Plan : General Plan of Care (Adult)  Updates made by [name], RN since 03/07/2021 12:00 AM     Problem: Knowledge deficit related to management of chronic medical conditions (ITP and HTN)   Priority: High     Long-Range Goal: Patient will verbalize and display chronic health management evidenced by no hospital admissions in the next 6 months   Start Date: 09/19/2020  Expected End Date: 03/20/2021  This Visit's Progress: On track  Recent Progress: On track  Priority: High  Note:   Current Barriers:  Knowledge Deficits related to plan of care for management of HTN, HLD, and ITP  Chronic Disease Management support and education needs related to HTN, HLD, and ITP  RNCM Clinical Goal(s):  Patient will verbalize understanding of plan for management of HTN and ITP take all medications exactly as prescribed and will call provider for medication related questions attend all scheduled medical appointments: PCP, Cardiology, and Hematology continue to work with RN Care Manager to address care management and care coordination needs related to HTN and ITP  through collaboration with RN Care manager, provider, and care team.   Interventions: Inter-dis1:1 collaboration with primary care provider regarding development and update of comprehensive plan of care as evidenced by provider attestation and co-signatureciplinary care team collaboration (see longitudinal plan of care) Evaluation of current treatment plan related to  self management and patient's adherence to plan as established by provider   Hypertension: (Status: Goal on track: YES.) Last practice recorded BP readings:  BP Readings from Last 3 Encounters:  09/11/20 (!) 144/84  08/26/20 (!) 171/74  08/05/20 (!) 156/88  Most recent eGFR/CrCl: No results found for: EGFR  No components found for: CRCL  Evaluation of current treatment plan related to hypertension self management and patient's adherence to plan as established by provider;   Reviewed prescribed diet low salt Advised patient,  providing education and rationale, to monitor blood pressure daily and record, calling PCP for findings outside established parameters;  Discussed complications of poorly controlled blood pressure such as heart disease, stroke, circulatory complications, vision complications, kidney impairment, sexual dysfunction;  Screening for signs and symptoms of depression related to chronic disease state;   11/8 - Admits that he is not consistent with monitoring BP daily.  Strongly encouraged to do so and record readings, state he will try.     Hyperlipidemia Interventions:  (Status:  New goal.) Long Term Goal Medication review performed; medication list updated in electronic medical record.  Provider established cholesterol goals reviewed Counseled on importance of regular laboratory monitoring as prescribed Provided HLD educational materials  ITPGoal on track:  Yes. Evaluation of current treatment plan related to  ITP , self-management and patient's adherence to plan as established by provider. Discussed plans with patient for ongoing care management follow up and provided patient with direct contact information for care management team Advised patient to call provider with any signs of bleeding.  Platetlets in August were 99,000, on 11/3 decreased to 78,000; Provided education to patient re: management of ITP and chronic anemia; Reviewed medications with patient and discussed schedule of follow up, including next set of labs and pending injection;  11/8 - Member state he has  several follow up appointments in the next few weeks.  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  State he is doing well, no complications post op.  Was seen by surgeon on 12/2, no follow up  needed.  Will see hematology and cardiology today, last platelet count was 92 and stable.  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  Will have appointment with PCP next week, will contact this care manager with any concerns.   Update 2/10 - Annual PCP visit completed on 1/24, member report only concern was elevated Triglycerides.  Educated on proper diet and exercise, verbalizes understanding.  Will follow up with hematology on 3/3, last platelet count was 97 in January, injection received.  He is hoping to be able to keep count greater than 100 with minimal injections.  Otherwise, no concerns voiced.         Plan:  Telephone follow up appointment with care management team member scheduled for:  1 month The patient has been provided with contact information for the care management team and has been advised to call with any health related questions or concerns.   [name], RN, MSN, [place] Manager [phone]

## 2021-03-26 DIAGNOSIS — R051 Acute cough: Secondary | ICD-10-CM | POA: Diagnosis not present

## 2021-03-26 DIAGNOSIS — J069 Acute upper respiratory infection, unspecified: Secondary | ICD-10-CM | POA: Diagnosis not present

## 2021-03-28 ENCOUNTER — Encounter: Payer: Self-pay | Admitting: Family

## 2021-03-28 ENCOUNTER — Inpatient Hospital Stay: Payer: Medicare Other

## 2021-03-28 ENCOUNTER — Inpatient Hospital Stay (HOSPITAL_BASED_OUTPATIENT_CLINIC_OR_DEPARTMENT_OTHER): Payer: Medicare Other | Admitting: Family

## 2021-03-28 ENCOUNTER — Inpatient Hospital Stay: Payer: Medicare Other | Attending: Hematology & Oncology

## 2021-03-28 ENCOUNTER — Other Ambulatory Visit: Payer: Self-pay

## 2021-03-28 VITALS — BP 145/94 | HR 65 | Temp 98.4°F | Resp 18 | Wt 214.4 lb

## 2021-03-28 DIAGNOSIS — D693 Immune thrombocytopenic purpura: Secondary | ICD-10-CM

## 2021-03-28 DIAGNOSIS — Z79899 Other long term (current) drug therapy: Secondary | ICD-10-CM | POA: Insufficient documentation

## 2021-03-28 LAB — CBC WITH DIFFERENTIAL (CANCER CENTER ONLY)
Abs Immature Granulocytes: 0.04 10*3/uL (ref 0.00–0.07)
Basophils Absolute: 0 10*3/uL (ref 0.0–0.1)
Basophils Relative: 0 %
Eosinophils Absolute: 0.3 10*3/uL (ref 0.0–0.5)
Eosinophils Relative: 4 %
HCT: 38.1 % — ABNORMAL LOW (ref 39.0–52.0)
Hemoglobin: 12.8 g/dL — ABNORMAL LOW (ref 13.0–17.0)
Immature Granulocytes: 1 %
Lymphocytes Relative: 19 %
Lymphs Abs: 1.4 10*3/uL (ref 0.7–4.0)
MCH: 31.1 pg (ref 26.0–34.0)
MCHC: 33.6 g/dL (ref 30.0–36.0)
MCV: 92.7 fL (ref 80.0–100.0)
Monocytes Absolute: 0.7 10*3/uL (ref 0.1–1.0)
Monocytes Relative: 9 %
Neutro Abs: 4.7 10*3/uL (ref 1.7–7.7)
Neutrophils Relative %: 67 %
Platelet Count: 76 10*3/uL — ABNORMAL LOW (ref 150–400)
RBC: 4.11 MIL/uL — ABNORMAL LOW (ref 4.22–5.81)
RDW: 13.6 % (ref 11.5–15.5)
WBC Count: 7.1 10*3/uL (ref 4.0–10.5)
nRBC: 0 % (ref 0.0–0.2)

## 2021-03-28 LAB — SAVE SMEAR(SSMR), FOR PROVIDER SLIDE REVIEW

## 2021-03-28 LAB — CMP (CANCER CENTER ONLY)
ALT: 12 U/L (ref 0–44)
AST: 20 U/L (ref 15–41)
Albumin: 4.3 g/dL (ref 3.5–5.0)
Alkaline Phosphatase: 49 U/L (ref 38–126)
Anion gap: 8 (ref 5–15)
BUN: 17 mg/dL (ref 8–23)
CO2: 30 mmol/L (ref 22–32)
Calcium: 9.2 mg/dL (ref 8.9–10.3)
Chloride: 100 mmol/L (ref 98–111)
Creatinine: 1.08 mg/dL (ref 0.61–1.24)
GFR, Estimated: >60 mL/min (ref >60)
Glucose, Bld: 95 mg/dL (ref 70–99)
Potassium: 4.2 mmol/L (ref 3.5–5.1)
Sodium: 138 mmol/L (ref 135–145)
Total Bilirubin: 0.6 mg/dL (ref 0.3–1.2)
Total Protein: 7.2 g/dL (ref 6.5–8.1)

## 2021-03-28 MED ORDER — ROMIPLOSTIM 125 MCG ~~LOC~~ SOLR
1.0000 ug/kg | Freq: Once | SUBCUTANEOUS | Status: AC
  Administered 2021-03-28: 95 ug via SUBCUTANEOUS
  Filled 2021-03-28: qty 0.19

## 2021-03-28 NOTE — Patient Instructions (Signed)
Romiplostim injection ?What is this medication? ?ROMIPLOSTIM (roe mi PLOE stim) helps your body make more platelets. This medicine is used to treat low platelets caused by chronic idiopathic thrombocytopenic purpura (ITP) or a bone marrow syndrome caused by radiation sickness. ?This medicine may be used for other purposes; ask your health care provider or pharmacist if you have questions. ?COMMON BRAND NAME(S): Nplate ?What should I tell my care team before I take this medication? ?They need to know if you have any of these conditions: ?blood clots ?myelodysplastic syndrome ?an unusual or allergic reaction to romiplostim, mannitol, other medicines, foods, dyes, or preservatives ?pregnant or trying to get pregnant ?breast-feeding ?How should I use this medication? ?This medicine is injected under the skin. It is given by a health care provider in a hospital or clinic setting. ?A special MedGuide will be given to you before each treatment. Be sure to read this information carefully each time. ?Talk to your health care provider about the use of this medicine in children. While it may be prescribed for children as young as newborns for selected conditions, precautions do apply. ?Overdosage: If you think you have taken too much of this medicine contact a poison control center or emergency room at once. ?NOTE: This medicine is only for you. Do not share this medicine with others. ?What if I miss a dose? ?Keep appointments for follow-up doses. It is important not to miss your dose. Call your health care provider if you are unable to keep an appointment. ?What may interact with this medication? ?Interactions are not expected. ?This list may not describe all possible interactions. Give your health care provider a list of all the medicines, herbs, non-prescription drugs, or dietary supplements you use. Also tell them if you smoke, drink alcohol, or use illegal drugs. Some items may interact with your medicine. ?What should I  watch for while using this medication? ?Visit your health care provider for regular checks on your progress. You may need blood work done while you are taking this medicine. Your condition will be monitored carefully while you are receiving this medicine. It is important not to miss any appointments. ?What side effects may I notice from receiving this medication? ?Side effects that you should report to your doctor or health care professional as soon as possible: ?allergic reactions (skin rash, itching or hives; swelling of the face, lips, or tongue) ?bleeding (bloody or black, tarry stools; red or dark brown urine; spitting up blood or brown material that looks like coffee grounds; red spots on the skin; unusual bruising or bleeding from the eyes, gums, or nose) ?blood clot (chest pain; shortness of breath; pain, swelling, or warmth in the leg) ?stroke (changes in vision; confusion; trouble speaking or understanding; severe headaches; sudden numbness or weakness of the face, arm or leg; trouble walking; dizziness; loss of balance or coordination) ?Side effects that usually do not require medical attention (report to your doctor or health care professional if they continue or are bothersome): ?diarrhea ?dizziness ?headache ?joint pain ?muscle pain ?stomach pain ?trouble sleeping ?This list may not describe all possible side effects. Call your doctor for medical advice about side effects. You may report side effects to FDA at 1-800-FDA-1088. ?Where should I keep my medication? ?This medicine is given in a hospital or clinic. It will not be stored at home. ?NOTE: This sheet is a summary. It may not cover all possible information. If you have questions about this medicine, talk to your doctor, pharmacist, or health care provider. ??   2022 Elsevier/Gold Standard (2020-10-01 00:00:00) ? ?

## 2021-03-28 NOTE — Progress Notes (Signed)
?Hematology and Oncology Follow Up Visit ? ?Lonnie Hansen ?[id number] ?09-23-1950 71 y.o. ?03/28/2021 ? ? 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  ?Current Therapy:        ?Nplate to keep platelet count over 100,000 ?  ?Interim History:  Lonnie Hansen is here today for follow-up and Nplate injection. Platelet count is 76.  ?He is asymptomatic with this time. No blood loss. No bruising or petechiae.  ?No fever, chills, n/v, cough, rash, dizziness, SOB, chest pain, palpitations, abdominal pain or changes in bowel or bladder habits.  ?No swelling, tenderness, numbness or tingling in her extremities.  ?No falls or syncope to report.  ?He is eating well and staying hydrated throughout the day. His weight is stable at 214 lbs.  ? ?ECOG Performance Status: 1 - Symptomatic but completely ambulatory ? ?Medications:  ?Allergies as of 03/28/2021   ?No Known Allergies ?  ? ?  ?Medication List  ?  ? ?  ? Accurate as of March 28, 2021  1:27 PM. If you have any questions, ask your nurse or doctor.  ?  ?  ? ?  ? ?acetaminophen 500 MG tablet ?Commonly known as: TYLENOL ?Take 1,000 mg by mouth every 6 (six) hours as needed for moderate pain. ?  ?acetaminophen 325 MG tablet ?Commonly known as: TYLENOL ?Take 2 tablets (650 mg total) by mouth every 6 (six) hours as needed for mild pain or fever. ?  ?amLODipine 10 MG tablet ?Commonly known as: NORVASC ?Take 10 mg by mouth every evening. ?  ?atenolol 25 MG tablet ?Commonly known as: TENORMIN ?Take 12.5 mg by mouth 2 (two) times daily. ?  ?Ensure Plus Liqd ?Take 237 mLs by mouth daily. ?  ?ferrous sulfate 325 (65 FE) MG tablet ?Take 325 mg by mouth 2 (two) times a week. ?  ?latanoprost 0.005 % ophthalmic solution ?Commonly known as: XALATAN ?Place 1 drop into both eyes 2 (two) times daily. ?  ?multivitamin with minerals Tabs tablet ?Take 1 tablet by mouth daily at 12 noon. ?  ?ondansetron 4 MG tablet ?Commonly known as: Zofran ?Take 1 tablet (4 mg total) by mouth every 8 (eight) hours as needed for  nausea or vomiting. ?  ?pantoprazole 40 MG tablet ?Commonly known as: PROTONIX ?Take 40 mg by mouth 2 (two) times daily. ?  ?Pfizer COVID-19 Vac Bivalent injection ?Generic drug: COVID-19 mRNA bivalent vaccine [employer]) ?Inject into the muscle. ?  ?polyethylene glycol 17 g packet ?Commonly known as: MIRALAX / GLYCOLAX ?Take 17 g by mouth daily. ?What changed:  ?when to take this ?reasons to take this ?  ?psyllium 95 % Pack ?Commonly known as: HYDROCIL/METAMUCIL ?Take 1 packet by mouth daily. ?  ?rosuvastatin 10 MG tablet ?Commonly known as: CRESTOR ?Take 1 tablet by mouth once daily ?What changed: when to take this ?  ?traMADol 50 MG tablet ?Commonly known as: ULTRAM ?Take 1 tablet (50 mg total) by mouth every 6 (six) hours as needed. ?  ?triamcinolone cream 0.1 % ?Commonly known as: KENALOG ?Apply 1 application topically 2 (two) times daily as needed (facial itching/rash). ?  ?triamcinolone 0.025 % cream ?Commonly known as: KENALOG ?Apply topically 2 (two) times daily. ?  ? ?  ? ? ?Allergies: No Known Allergies ? ?Past Medical History, Surgical history, Social history, and Family History were reviewed and updated. ? ?Review of Systems: ?All other 10 point review of systems is negative.  ? ?Physical Exam: ? vitals were not taken for this visit.  ? ?Wt Readings  from Last 3 Encounters:  ?01/28/21 209 lb 0.6 oz (94.8 kg)  ?12/30/20 206 lb 9.6 oz (93.7 kg)  ?12/18/20 212 lb (96.2 kg)  ? ? ?Ocular: Sclerae unicteric, pupils equal, round and reactive to light ?Ear-nose-throat: Oropharynx clear, dentition fair ?Lymphatic: No cervical or supraclavicular adenopathy ?Lungs no rales or rhonchi, good excursion bilaterally ?Heart regular rate and rhythm, no murmur appreciated ?Abd soft, nontender, positive bowel sounds ?MSK no focal spinal tenderness, no joint edema ?Neuro: non-focal, well-oriented, appropriate affect ?Breasts: Deferred  ? ?Lab Results  ?Component Value Date  ? WBC 6.2 01/28/2021  ? HGB 12.8 (L) 01/28/2021  ? HCT  38.1 (L) 01/28/2021  ? MCV 94.1 01/28/2021  ? PLT 97 (L) 01/28/2021  ? ?No results found for: FERRITIN, IRON, TIBC, UIBC, IRONPCTSAT ?Lab Results  ?Component Value Date  ? RBC 4.05 (L) 01/28/2021  ? ?No results found for: KPAFRELGTCHN, LAMBDASER, KAPLAMBRATIO ?No results found for: IGGSERUM, IGA, IGMSERUM ?No results found for: TOTALPROTELP, ALBUMINELP, A1GS, A2GS, BETS, BETA2SER, GAMS, MSPIKE, SPEI ?  Chemistry   ?   ?Component Value Date/Time  ? NA 140 01/28/2021 1047  ? NA 140 05/11/2019 0946  ? K 4.0 01/28/2021 1047  ? CL 103 01/28/2021 1047  ? CO2 31 01/28/2021 1047  ? BUN 15 01/28/2021 1047  ? BUN 17 05/11/2019 0946  ? CREATININE 1.11 01/28/2021 1047  ? CREATININE 0.75 04/10/2016 1132  ?    ?Component Value Date/Time  ? CALCIUM 9.7 01/28/2021 1047  ? ALKPHOS 51 01/28/2021 1047  ? AST 16 01/28/2021 1047  ? ALT 10 01/28/2021 1047  ? BILITOT 0.6 01/28/2021 1047  ?  ? ? ? ?Impression and Plan: [name] is a very pleasant 71 yo African American gentleman with chronic ITP.  ?Nplate given today.  ?Lab check and Nplate every 2 weeks. Follow-up in 8 weeks.  ? ?Lottie Dawson, NP ?3/3/20231:27 PM ? ?

## 2021-04-02 ENCOUNTER — Telehealth: Payer: Self-pay | Admitting: *Deleted

## 2021-04-02 NOTE — Telephone Encounter (Signed)
Per 04/02/21 los - mailed calendar ?

## 2021-04-03 DIAGNOSIS — Z8719 Personal history of other diseases of the digestive system: Secondary | ICD-10-CM | POA: Diagnosis not present

## 2021-04-03 DIAGNOSIS — K649 Unspecified hemorrhoids: Secondary | ICD-10-CM | POA: Diagnosis not present

## 2021-04-03 DIAGNOSIS — D693 Immune thrombocytopenic purpura: Secondary | ICD-10-CM | POA: Diagnosis not present

## 2021-04-03 DIAGNOSIS — D500 Iron deficiency anemia secondary to blood loss (chronic): Secondary | ICD-10-CM | POA: Diagnosis not present

## 2021-04-03 DIAGNOSIS — K625 Hemorrhage of anus and rectum: Secondary | ICD-10-CM | POA: Diagnosis not present

## 2021-04-08 ENCOUNTER — Other Ambulatory Visit: Payer: Self-pay | Admitting: *Deleted

## 2021-04-08 NOTE — Patient Outreach (Signed)
?[facility]) Care Management ?Telephonic RN Care Manager Note ? ? ?04/08/2021 ?Name:  Lonnie Hansen MRN:  [id number] DOB:  Jan 18, 1951 ? ?Summary: ?Outgoing call placed to member, successful.  Denies any urgent concerns, encouraged to contact this care manager with questions.   ? ?Recommendations/Changes made from today's visit: ?Call Vascular provider to schedule follow up appointment and ultrasound. ? ?Subjective: ?Lonnie Hansen is an 71 y.o. year old male who is a primary patient of [name], MD. The care management team was consulted for assistance with care management and/or care coordination needs.   ? ?Telephonic RN Care Manager completed Telephone Visit today. ? ?Objective:  ? ?Medications Reviewed Today   ? ? Reviewed by Fabio Neighbors, LPN (Licensed Practical Nurse) on 03/28/21 at 24  Med List Status: <None>  ? ?Medication Order Taking? Sig Documenting Provider Last Dose Status Informant  ?acetaminophen (TYLENOL) 325 MG tablet [id number] Yes Take 2 tablets (650 mg total) by mouth every 6 (six) hours as needed for mild pain or fever. Estill Cotta, NP Taking Active Self  ?acetaminophen (TYLENOL) 500 MG tablet [id number] Yes Take 1,000 mg by mouth every 6 (six) hours as needed for moderate pain. [provider] Taking Active Self  ?amLODipine (NORVASC) 10 MG tablet [id number] Yes Take 10 mg by mouth every evening. [provider] Taking Active Self  ?atenolol (TENORMIN) 25 MG tablet [id number] Yes Take 12.5 mg by mouth 2 (two) times daily. [name], MD Taking Active Self  ?COVID-19 mRNA bivalent vaccine, Pfizer, (PFIZER COVID-19 VAC BIVALENT) injection [id number] Yes Inject into the muscle. Carlyle Basques, MD Taking Active   ?Ensure Plus (ENSURE PLUS) LIQD [id number] Yes Take 237 mLs by mouth daily. [provider] Taking Active Self  ?ferrous sulfate 325 (65 FE) MG tablet [id number] Yes Take 325 mg by mouth 2 (two) times a week. [provider] Taking Active Self  ?latanoprost (XALATAN) 0.005 % ophthalmic solution [id number] Yes Place 1 drop into both eyes 2 (two) times daily. [provider] Taking Active Self  ?Multiple Vitamin (MULTIVITAMIN WITH MINERALS) TABS tablet [id number] Yes Take 1 tablet by mouth daily at 12 noon. [provider] Taking Active Self  ?         ?Med Note Leanord Asal T   Tue Oct 06, 2016 10:16 AM)    ?ondansetron (ZOFRAN) 4 MG tablet [id number] No Take 1 tablet (4 mg total) by mouth every 8 (eight) hours as needed for nausea or vomiting.  ?Patient not taking: Reported on 03/28/2021  ? Margarita Mail, PA-C Not Taking Consider Medication Status and Discontinue   ?pantoprazole (PROTONIX) 40 MG tablet [id number] Yes Take 40 mg by mouth 2 (two) times daily. [provider] Taking Active Self  ?polyethylene glycol (MIRALAX / GLYCOLAX) 17 g packet [id number] Yes Take 17 g by mouth daily.  ?Patient taking differently: Take 17 g by mouth daily as needed for moderate constipation.  ? [name], MD Taking Active Self  ?psyllium (HYDROCIL/METAMUCIL) 95 % PACK [id number] Yes Take 1 packet by mouth daily. [name], MD Taking Active Self  ?rosuvastatin (CRESTOR) 10 MG tablet [id number] Yes Take 1 tablet by mouth once daily  ?Patient taking differently: Take 10 mg by mouth every evening.  ? Martinique, [name], MD Taking Active Self  ?traMADol (ULTRAM) 50 MG tablet [id number] No Take 1 tablet (50 mg total) by mouth every 6 (six) hours as needed.  ?Patient not taking: Reported on 03/28/2021  ? Kenton Kingfisher,  Vernie Shanks, PA-C Not Taking Consider Medication Status and Discontinue   ?triamcinolone (KENALOG) 0.025 % cream [id number] Yes Apply topically 2 (two) times daily. [provider] Taking Active Self  ?triamcinolone cream (KENALOG) 0.1 % [id number] Yes Apply 1 application topically 2 (two) times daily as needed (facial itching/rash). [provider] Taking Active Self  ? ?  ?  ? ?  ? ? ? ?SDOH:   (Social Determinants of Health) assessments and interventions performed:  ? ? ? ?Care Plan ? ?Review of patient past medical history, allergies, medications, health status, including review of consultants reports, laboratory and other test data, was performed as part of comprehensive evaluation for care management services.  ? ?Care Plan : General Plan of Care (Adult)  ?Updates made by [name], RN since 04/08/2021 12:00 AM  ?  ? ?Problem: Knowledge deficit related to management of chronic medical conditions (ITP and HTN)   ?Priority: High  ?  ? ?Long-Range Goal: Patient will verbalize and display chronic health management evidenced by no hospital admissions in the next 6 months   ?Start Date: 09/19/2020  ?Expected End Date: 03/20/2021  ?This Visit's Progress: On track  ?Recent Progress: On track  ?Priority: High  ?Note:   ?Current Barriers:  ?Knowledge Deficits related to plan of care for management of HTN, HLD, and ITP  ?Chronic Disease Management support and education needs related to HTN, HLD, and ITP ? ?RNCM Clinical Goal(s):  ?Patient will verbalize understanding of plan for management of HTN and ITP ?take all medications exactly as prescribed and will call provider for medication related questions ?attend all scheduled medical appointments: PCP, Cardiology, and Hematology ?continue to work with RN Care Manager to address care management and care coordination needs related to HTN and ITP  through collaboration with RN Care manager, provider, and care team.  ? ?Interventions: ?Inter-dis1:1 collaboration with primary care provider regarding development and update of comprehensive plan of care as evidenced by provider attestation and co-signatureciplinary care team collaboration (see longitudinal plan of care) ?Evaluation of current treatment plan related to  self management and patient's adherence to plan as established by provider ? ? ?Hypertension: (Status: Goal on track: YES.) ?Last practice recorded BP  readings:  ?BP Readings from Last 3 Encounters:  ?03/28/21 (!) 145/94  ?01/28/21 (!) 148/85  ?12/30/20 137/78  ?Most recent eGFR/CrCl: No results found for: EGFR  No components found for: CRCL ? ?Evaluation of current treatment plan related to hypertension self management and patient's adherence to plan as established by provider;   ?Reviewed prescribed diet low salt ?Advised patient, providing education and rationale, to monitor blood pressure daily and record, calling PCP for findings outside established parameters;  ?Discussed complications of poorly controlled blood pressure such as heart disease, stroke, circulatory complications, vision complications, kidney impairment, sexual dysfunction;  ?Screening for signs and symptoms of depression related to chronic disease state;  ? ?11/8 - Admits that he is not consistent with monitoring BP daily.  Strongly encouraged to do so and record readings, state he will try.   ? ? ?Hyperlipidemia Interventions:  (Status:  New goal.) Long Term Goal ?Medication review performed; medication list updated in electronic medical record.  ?Provider established cholesterol goals reviewed ?Counseled on importance of regular laboratory monitoring as prescribed ?Provided HLD educational materials ? ?ITPGoal on track:  Yes. ?Evaluation of current treatment plan related to  ITP , self-management and patient's adherence to plan as established by provider. ?Discussed plans with patient for ongoing care management follow up and  provided patient with direct contact information for care management team ?Advised patient to call provider with any signs of bleeding.  Platetlets in August were 99,000, on 11/3 decreased to 78,000; ?Provided education to patient re: management of ITP and chronic anemia; ?Reviewed medications with patient and discussed schedule of follow up, including next set of labs and pending injection; ? ?11/8 - Member state he has several follow up appointments in the next few  weeks.  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, 12/5 with cardiology, and 1/3 with oncology for labs and office visit ? ? ?Update 12/5 - Was rece

## 2021-04-09 ENCOUNTER — Other Ambulatory Visit: Payer: Self-pay | Admitting: *Deleted

## 2021-04-09 DIAGNOSIS — I714 Abdominal aortic aneurysm, without rupture, unspecified: Secondary | ICD-10-CM

## 2021-04-11 ENCOUNTER — Other Ambulatory Visit: Payer: Self-pay

## 2021-04-11 ENCOUNTER — Telehealth: Payer: Self-pay

## 2021-04-11 ENCOUNTER — Inpatient Hospital Stay: Payer: Medicare Other

## 2021-04-11 ENCOUNTER — Ambulatory Visit (HOSPITAL_COMMUNITY)
Admission: RE | Admit: 2021-04-11 | Discharge: 2021-04-11 | Disposition: A | Discharge status: Home / Self Care | Payer: Medicare Other | Source: Ambulatory Visit | Attending: Vascular Surgery | Admitting: Vascular Surgery

## 2021-04-11 DIAGNOSIS — D693 Immune thrombocytopenic purpura: Secondary | ICD-10-CM | POA: Diagnosis not present

## 2021-04-11 DIAGNOSIS — I714 Abdominal aortic aneurysm, without rupture, unspecified: Secondary | ICD-10-CM

## 2021-04-11 DIAGNOSIS — Z79899 Other long term (current) drug therapy: Secondary | ICD-10-CM | POA: Diagnosis not present

## 2021-04-11 DIAGNOSIS — Z20822 Contact with and (suspected) exposure to covid-19: Secondary | ICD-10-CM | POA: Diagnosis not present

## 2021-04-11 LAB — CBC WITH DIFFERENTIAL (CANCER CENTER ONLY)
Abs Immature Granulocytes: 0.04 10*3/uL (ref 0.00–0.07)
Basophils Absolute: 0 10*3/uL (ref 0.0–0.1)
Basophils Relative: 0 %
Eosinophils Absolute: 0.2 10*3/uL (ref 0.0–0.5)
Eosinophils Relative: 3 %
HCT: 34.2 % — ABNORMAL LOW (ref 39.0–52.0)
Hemoglobin: 11.4 g/dL — ABNORMAL LOW (ref 13.0–17.0)
Immature Granulocytes: 1 %
Lymphocytes Relative: 20 %
Lymphs Abs: 1.4 10*3/uL (ref 0.7–4.0)
MCH: 31.2 pg (ref 26.0–34.0)
MCHC: 33.3 g/dL (ref 30.0–36.0)
MCV: 93.7 fL (ref 80.0–100.0)
Monocytes Absolute: 0.6 10*3/uL (ref 0.1–1.0)
Monocytes Relative: 9 %
Neutro Abs: 4.6 10*3/uL (ref 1.7–7.7)
Neutrophils Relative %: 67 %
Platelet Count: 101 10*3/uL — ABNORMAL LOW (ref 150–400)
RBC: 3.65 MIL/uL — ABNORMAL LOW (ref 4.22–5.81)
RDW: 13.6 % (ref 11.5–15.5)
WBC Count: 6.8 10*3/uL (ref 4.0–10.5)
nRBC: 0 % (ref 0.0–0.2)

## 2021-04-11 LAB — CMP (CANCER CENTER ONLY)
ALT: 9 U/L (ref 0–44)
AST: 20 U/L (ref 15–41)
Albumin: 4.7 g/dL (ref 3.5–5.0)
Alkaline Phosphatase: 45 U/L (ref 38–126)
Anion gap: 7 (ref 5–15)
BUN: 15 mg/dL (ref 8–23)
CO2: 30 mmol/L (ref 22–32)
Calcium: 9.8 mg/dL (ref 8.9–10.3)
Chloride: 104 mmol/L (ref 98–111)
Creatinine: 1.2 mg/dL (ref 0.61–1.24)
GFR, Estimated: >60 mL/min (ref >60)
Glucose, Bld: 96 mg/dL (ref 70–99)
Potassium: 3.5 mmol/L (ref 3.5–5.1)
Sodium: 141 mmol/L (ref 135–145)
Total Bilirubin: 0.6 mg/dL (ref 0.3–1.2)
Total Protein: 7.4 g/dL (ref 6.5–8.1)

## 2021-04-11 NOTE — Telephone Encounter (Signed)
Reviewed pt labs with [name] who said pt did not need Nplate injection. Reviewed labs with patient who verbalized understanding and had no further questions. Reviewed pt next appointments with no further questions.  ?

## 2021-04-16 ENCOUNTER — Encounter: Payer: Self-pay | Admitting: Vascular Surgery

## 2021-04-16 ENCOUNTER — Ambulatory Visit (INDEPENDENT_AMBULATORY_CARE_PROVIDER_SITE_OTHER): Payer: Medicare Other | Admitting: Vascular Surgery

## 2021-04-16 ENCOUNTER — Other Ambulatory Visit: Payer: Self-pay

## 2021-04-16 VITALS — BP 133/79 | HR 58 | Temp 98.2°F | Resp 20 | Ht 68.0 in | Wt 205.0 lb

## 2021-04-16 DIAGNOSIS — I714 Abdominal aortic aneurysm, without rupture, unspecified: Secondary | ICD-10-CM

## 2021-04-16 DIAGNOSIS — I723 Aneurysm of iliac artery: Secondary | ICD-10-CM | POA: Diagnosis not present

## 2021-04-16 NOTE — Progress Notes (Signed)
? ?Patient ID: Lonnie Hansen, male   DOB: 1950-06-14, 71 y.o.   MRN: [id number] ? ?Reason for Consult: Follow-up ?  ?Referred by [name], MD ? ?Subjective:  ?   ?HPI: ? ?Lonnie Hansen is a 71 y.o. male with known abdominal aortic aneurysm.  He also has ITP followed by [name] and is planned for right hip replacement now that his platelets are above 100,000.  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  He has no new back or abdominal pain.  He walks with the help of a cane. ? ?Past Medical History:  ?Diagnosis Date  ? AAA (abdominal aortic aneurysm)   ? Anemia   ? Arthritis   ? Asymptomatic bilateral carotid artery stenosis 08/2015  ? 1-39%   ? Cataracts, bilateral   ? 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  ? Coronary artery disease   ? Diverticulosis   ? Enlarged aorta (HCC)   ? Enlarged prostate   ? slightly  ? GERD (gastroesophageal reflux disease)   ? takes Pantoprazole daily as needed  ? Glaucoma   ? uses eye drops daily  ? Headache   ? History of colon polyps   ? benign  ? History of kidney stones   ? Hyperlipidemia   ? no on any meds  ? Hypertension   ? takes Amlodipine and Atenolol daily  ? OSA on CPAP   ? Vocal cord nodule   ? pt. states  it's a" growth on vocal cord"  ? ?Family History  ?Problem Relation Age of Onset  ? Heart disease Father   ? Heart attack Father   ? Thyroid disease Sister   ? ?Past Surgical History:  ?Procedure Laterality Date  ? AORTIC ARCH ANGIOGRAPHY N/A 04/16/2016  ? Procedure: Aortic Arch Angiography;  Surgeon: [name] Martinique, MD;  Location: [place] [facility];  Service: Cardiovascular;  Laterality: N/A;  ? AORTIC VALVE REPLACEMENT N/A 09/15/2016  ? Procedure: AORTIC VALVE REPLACEMENT (AVR);  Surgeon: Grace Isaac, MD;  Location: [place];  Service: Open Heart Surgery;  Laterality: N/A;  Using 32m Edwards Perimount Magna Ease Aortic Bioprosthesis Valve  ? ASCENDING AORTIC ROOT REPLACEMENT N/A  09/15/2016  ? Procedure: ASCENDING AORTIC ROOT REPLACEMENT;  Surgeon: GGrace Isaac MD;  [facility]  Service: Open Heart Surgery;  Laterality: N/A;  Using 378mGelweave Valsalva Graft  ? CHOLECYSTECTOMY N/A 12/19/2020  ? Procedure: LAPAROSCOPIC CHOLECYSTECTOMY WITH POSSIBLE INTRAOPERATIVE CHOLANGIOGRAM;  Surgeon: WiGreer PickerelMD;  [place] [facility] Service: General;  Laterality: N/A;  ? COLONOSCOPY    ? COLONOSCOPY Left 04/16/2019  ? Procedure: COLONOSCOPY;  Surgeon: OuArta SilenceMD;  [facility] Service: Endoscopy;  Laterality: Left;  ? COLONOSCOPY N/A 06/01/2020  ? Procedure: COLONOSCOPY;  Surgeon: KaRonnette JuniperMD;  [place] Service: Gastroenterology;  Laterality: N/A;  ? COLONOSCOPY WITH ESOPHAGOGASTRODUODENOSCOPY (EGD)    ? ESOPHAGOGASTRODUODENOSCOPY N/A 05/25/2020  ? Procedure: ESOPHAGOGASTRODUODENOSCOPY (EGD);  Surgeon: ScWilford CornerMD;  [place] [facility] [facility] Service: Endoscopy;  Laterality: N/A;  ? ESOPHAGOGASTRODUODENOSCOPY (EGD) WITH PROPOFOL Left 04/16/2019  ? Procedure: ESOPHAGOGASTRODUODENOSCOPY (EGD) WITH PROPOFOL;  Surgeon: OuArta SilenceMD;  [facility];  Service: Endoscopy;  Laterality: Left;  ? FLEXIBLE SIGMOIDOSCOPY N/A 05/25/2020  ? Procedure: FLEXIBLE SIGMOIDOSCOPY;  Surgeon: ScWilford CornerMD;  [facility];  Service: Endoscopy;  Laterality: N/A;  ? IR THORACENTESIS ASP PLEURAL SPACE W/IMG GUIDE  10/23/2016  ? MICROLARYNGOSCOPY Right 09/20/2017  ? Procedure: MICROLARYNGOSCOPY WITH  EXCISION OF VOCAL CORD LESION;  Surgeon: Leta Baptist, MD;  Location: [place];  Service: ENT;  Laterality: Right;  ? MULTIPLE EXTRACTIONS WITH ALVEOLOPLASTY N/A 06/10/2016  ? Procedure: Extraction of tooth #'s 2,8,13,15, and 29  with alveoloplasty, maxillary right and left buccal exostoses reductions, and gross debridement of remaining teeth.;  Surgeon: Lenn Cal, DDS;  Location: [place];  Service: Oral Surgery;  Laterality:  N/A;  ? RIGHT/LEFT HEART CATH AND CORONARY ANGIOGRAPHY N/A 04/16/2016  ? Procedure: Right/Left Heart Cath and Coronary Angiography;  Surgeon: [name] Martinique, MD;  Location: [place] [facility];  Service: Cardiovascular;  Laterality: N/A;  ? TEE WITHOUT CARDIOVERSION N/A 09/15/2016  ? Procedure: TRANSESOPHAGEAL ECHOCARDIOGRAM (TEE);  Surgeon: Grace Isaac, MD;  Location: [place];  Service: Open Heart Surgery;  Laterality: N/A;  ? ? ?Short Social History:  ?Social History  ? ?Tobacco Use  ? Smoking status: Former  ?  Packs/day: 1.00  ?  Years: 25.00  ?  Pack years: 25.00  ?  Types: Cigarettes  ? Smokeless tobacco: Never  ?Substance Use Topics  ? Alcohol use: No  ?  Alcohol/week: 0.0 standard drinks  ? ? ?No Known Allergies ? ?Current Outpatient Medications  ?Medication Sig Dispense Refill  ? acetaminophen (TYLENOL) 325 MG tablet Take 2 tablets (650 mg total) by mouth every 6 (six) hours as needed for mild pain or fever.    ? acetaminophen (TYLENOL) 500 MG tablet Take 1,000 mg by mouth every 6 (six) hours as needed for moderate pain.    ? amLODipine (NORVASC) 10 MG tablet Take 10 mg by mouth every evening.    ? atenolol (TENORMIN) 25 MG tablet Take 12.5 mg by mouth 2 (two) times daily. 180 tablet 3  ? Ensure Plus (ENSURE PLUS) LIQD Take 237 mLs by mouth daily.    ? ferrous sulfate 325 (65 FE) MG tablet Take 325 mg by mouth 2 (two) times a week.    ? latanoprost (XALATAN) 0.005 % ophthalmic solution Place 1 drop into both eyes 2 (two) times daily.    ? Multiple Vitamin (MULTIVITAMIN WITH MINERALS) TABS tablet Take 1 tablet by mouth daily at 12 noon.    ? pantoprazole (PROTONIX) 40 MG tablet Take 40 mg by mouth 2 (two) times daily.    ? polyethylene glycol (MIRALAX / GLYCOLAX) 17 g packet Take 17 g by mouth daily. (Patient taking differently: Take 17 g by mouth daily as needed for moderate constipation.) 14 each 0  ? psyllium (HYDROCIL/METAMUCIL) 95 % PACK Take 1 packet by mouth daily. 240 each 3  ? rosuvastatin  (CRESTOR) 10 MG tablet Take 1 tablet by mouth once daily (Patient taking differently: Take 10 mg by mouth every evening.) 90 tablet 1  ? traMADol (ULTRAM) 50 MG tablet Take 1 tablet (50 mg total) by mouth every 6 (six) hours as needed. 15 tablet 0  ? triamcinolone (KENALOG) 0.025 % cream Apply topically 2 (two) times daily.    ? triamcinolone cream (KENALOG) 0.1 % Apply 1 application topically 2 (two) times daily as needed (facial itching/rash).    ? ?No current facility-administered medications for this visit.  ? ? ?Review of Systems  ?Constitutional:  Constitutional negative. ?HENT: HENT negative.  ?Eyes: Eyes negative.  ?Respiratory: Respiratory negative.  ?Cardiovascular: Cardiovascular negative.  ?GI: Gastrointestinal negative.  ?Musculoskeletal: Musculoskeletal negative.  ?Skin: Skin negative.  ?Neurological: Neurological negative. ?Hematologic: Positive for  bruises/bleeds easily.  ?Psychiatric: Psychiatric negative.   ? ?   ?Objective:  ?Objective  ? ?Vitals:  ? 04/16/21 1438  ?BP: 133/79  ?Pulse: (!) 58  ?Resp: 20  ?Temp: 98.2 ?F (36.8 ?C)  ?SpO2: 93%  ?Weight: 205 lb (93 kg)  ?Height: '5\' 8"'$  (1.727 m)  ? ?Body mass index is 31.17 kg/m?. ? ?Physical Exam ?HENT:  ?   Head: Normocephalic.  ?   Nose:  ?   Comments: Wearing a mask ?Eyes:  ?   Pupils: Pupils are equal, round, and reactive to light.  ?Cardiovascular:  ?   Rate and Rhythm: Normal rate.  ?   Pulses:     ?     Femoral pulses are 2+ on the right side and 2+ on the left side. ?     Popliteal pulses are 3+ on the right side.  ?Pulmonary:  ?   Effort: Pulmonary effort is normal.  ?Abdominal:  ?   General: Abdomen is flat.  ?   Palpations: Abdomen is soft. There is no mass.  ?Musculoskeletal:     ?   General: Normal range of motion.  ?   Right lower leg: No edema.  ?   Left lower leg: No edema.  ?Skin: ?   General: Skin is warm and dry.  ?   Capillary Refill: Capillary refill takes less than 2 seconds.  ?   Findings: Bruising present.  ?Neurological:  ?    General: No focal deficit present.  ?   Mental Status: He is alert.  ?Psychiatric:     ?   Mood and Affect: Mood normal.  ? ? ?Data: ?Abdominal Aorta Findings:  ?+-----------+-------+----------+----------+-

## 2021-04-22 ENCOUNTER — Other Ambulatory Visit: Payer: Self-pay | Admitting: *Deleted

## 2021-04-22 DIAGNOSIS — I714 Abdominal aortic aneurysm, without rupture, unspecified: Secondary | ICD-10-CM

## 2021-04-22 DIAGNOSIS — I723 Aneurysm of iliac artery: Secondary | ICD-10-CM

## 2021-04-25 ENCOUNTER — Inpatient Hospital Stay: Payer: Medicare Other

## 2021-04-25 VITALS — BP 144/53 | HR 58 | Temp 98.5°F | Resp 18

## 2021-04-25 DIAGNOSIS — Z79899 Other long term (current) drug therapy: Secondary | ICD-10-CM | POA: Diagnosis not present

## 2021-04-25 DIAGNOSIS — D693 Immune thrombocytopenic purpura: Secondary | ICD-10-CM

## 2021-04-25 LAB — CBC WITH DIFFERENTIAL (CANCER CENTER ONLY)
Abs Immature Granulocytes: 0.07 10*3/uL (ref 0.00–0.07)
Basophils Absolute: 0 10*3/uL (ref 0.0–0.1)
Basophils Relative: 0 %
Eosinophils Absolute: 0.2 10*3/uL (ref 0.0–0.5)
Eosinophils Relative: 2 %
HCT: 36 % — ABNORMAL LOW (ref 39.0–52.0)
Hemoglobin: 12 g/dL — ABNORMAL LOW (ref 13.0–17.0)
Immature Granulocytes: 1 %
Lymphocytes Relative: 19 %
Lymphs Abs: 1.5 10*3/uL (ref 0.7–4.0)
MCH: 31.4 pg (ref 26.0–34.0)
MCHC: 33.3 g/dL (ref 30.0–36.0)
MCV: 94.2 fL (ref 80.0–100.0)
Monocytes Absolute: 0.8 10*3/uL (ref 0.1–1.0)
Monocytes Relative: 10 %
Neutro Abs: 5.2 10*3/uL (ref 1.7–7.7)
Neutrophils Relative %: 68 %
Platelet Count: 86 10*3/uL — ABNORMAL LOW (ref 150–400)
RBC: 3.82 MIL/uL — ABNORMAL LOW (ref 4.22–5.81)
RDW: 13.8 % (ref 11.5–15.5)
WBC Count: 7.7 10*3/uL (ref 4.0–10.5)
nRBC: 0 % (ref 0.0–0.2)

## 2021-04-25 LAB — CMP (CANCER CENTER ONLY)
ALT: 10 U/L (ref 0–44)
AST: 19 U/L (ref 15–41)
Albumin: 4.7 g/dL (ref 3.5–5.0)
Alkaline Phosphatase: 43 U/L (ref 38–126)
Anion gap: 7 (ref 5–15)
BUN: 12 mg/dL (ref 8–23)
CO2: 30 mmol/L (ref 22–32)
Calcium: 9.8 mg/dL (ref 8.9–10.3)
Chloride: 102 mmol/L (ref 98–111)
Creatinine: 1.11 mg/dL (ref 0.61–1.24)
GFR, Estimated: >60 mL/min (ref >60)
Glucose, Bld: 101 mg/dL — ABNORMAL HIGH (ref 70–99)
Potassium: 4.1 mmol/L (ref 3.5–5.1)
Sodium: 139 mmol/L (ref 135–145)
Total Bilirubin: 0.6 mg/dL (ref 0.3–1.2)
Total Protein: 7.4 g/dL (ref 6.5–8.1)

## 2021-04-25 MED ORDER — ROMIPLOSTIM 125 MCG ~~LOC~~ SOLR
1.0000 ug/kg | Freq: Once | SUBCUTANEOUS | Status: AC
  Administered 2021-04-25: 95 ug via SUBCUTANEOUS
  Filled 2021-04-25: qty 0.19

## 2021-04-25 NOTE — Patient Instructions (Signed)
Romiplostim injection ?What is this medication? ?ROMIPLOSTIM (roe mi PLOE stim) helps your body make more platelets. This medicine is used to treat low platelets caused by chronic idiopathic thrombocytopenic purpura (ITP) or a bone marrow syndrome caused by radiation sickness. ?This medicine may be used for other purposes; ask your health care provider or pharmacist if you have questions. ?COMMON BRAND NAME(S): Nplate ?What should I tell my care team before I take this medication? ?They need to know if you have any of these conditions: ?blood clots ?myelodysplastic syndrome ?an unusual or allergic reaction to romiplostim, mannitol, other medicines, foods, dyes, or preservatives ?pregnant or trying to get pregnant ?breast-feeding ?How should I use this medication? ?This medicine is injected under the skin. It is given by a health care provider in a hospital or clinic setting. ?A special MedGuide will be given to you before each treatment. Be sure to read this information carefully each time. ?Talk to your health care provider about the use of this medicine in children. While it may be prescribed for children as young as newborns for selected conditions, precautions do apply. ?Overdosage: If you think you have taken too much of this medicine contact a poison control center or emergency room at once. ?NOTE: This medicine is only for you. Do not share this medicine with others. ?What if I miss a dose? ?Keep appointments for follow-up doses. It is important not to miss your dose. Call your health care provider if you are unable to keep an appointment. ?What may interact with this medication? ?Interactions are not expected. ?This list may not describe all possible interactions. Give your health care provider a list of all the medicines, herbs, non-prescription drugs, or dietary supplements you use. Also tell them if you smoke, drink alcohol, or use illegal drugs. Some items may interact with your medicine. ?What should I  watch for while using this medication? ?Visit your health care provider for regular checks on your progress. You may need blood work done while you are taking this medicine. Your condition will be monitored carefully while you are receiving this medicine. It is important not to miss any appointments. ?What side effects may I notice from receiving this medication? ?Side effects that you should report to your doctor or health care professional as soon as possible: ?allergic reactions (skin rash, itching or hives; swelling of the face, lips, or tongue) ?bleeding (bloody or black, tarry stools; red or dark brown urine; spitting up blood or brown material that looks like coffee grounds; red spots on the skin; unusual bruising or bleeding from the eyes, gums, or nose) ?blood clot (chest pain; shortness of breath; pain, swelling, or warmth in the leg) ?stroke (changes in vision; confusion; trouble speaking or understanding; severe headaches; sudden numbness or weakness of the face, arm or leg; trouble walking; dizziness; loss of balance or coordination) ?Side effects that usually do not require medical attention (report to your doctor or health care professional if they continue or are bothersome): ?diarrhea ?dizziness ?headache ?joint pain ?muscle pain ?stomach pain ?trouble sleeping ?This list may not describe all possible side effects. Call your doctor for medical advice about side effects. You may report side effects to FDA at 1-800-FDA-1088. ?Where should I keep my medication? ?This medicine is given in a hospital or clinic. It will not be stored at home. ?NOTE: This sheet is a summary. It may not cover all possible information. If you have questions about this medicine, talk to your doctor, pharmacist, or health care provider. ??   2022 Elsevier/Gold Standard (2020-10-01 00:00:00) ? ?

## 2021-04-29 DIAGNOSIS — K625 Hemorrhage of anus and rectum: Secondary | ICD-10-CM | POA: Diagnosis not present

## 2021-04-29 DIAGNOSIS — D693 Immune thrombocytopenic purpura: Secondary | ICD-10-CM | POA: Diagnosis not present

## 2021-04-29 DIAGNOSIS — K5790 Diverticulosis of intestine, part unspecified, without perforation or abscess without bleeding: Secondary | ICD-10-CM | POA: Diagnosis not present

## 2021-04-29 DIAGNOSIS — K5900 Constipation, unspecified: Secondary | ICD-10-CM | POA: Diagnosis not present

## 2021-04-29 DIAGNOSIS — K648 Other hemorrhoids: Secondary | ICD-10-CM | POA: Diagnosis not present

## 2021-05-05 ENCOUNTER — Other Ambulatory Visit: Payer: Self-pay | Admitting: *Deleted

## 2021-05-05 NOTE — Patient Outreach (Signed)
[facility] [place] [facility]) Care Management ? ?05/05/2021 ? ?[name] ?Feb 03, 1950 ?[id number] ? ? ?Outgoing call placed to member, state he is still sleeping, will call this care manager once he is awake.  If no call back, will follow up within the next 3-4 business days. ? ?[name], RN, MSN, CCM ?[facility] Care Management  ?Community Care Manager ?[phone] ? ?

## 2021-05-06 ENCOUNTER — Other Ambulatory Visit: Payer: Self-pay | Admitting: Surgery

## 2021-05-06 ENCOUNTER — Telehealth: Payer: Self-pay

## 2021-05-06 DIAGNOSIS — I7121 Aneurysm of the ascending aorta, without rupture: Secondary | ICD-10-CM

## 2021-05-06 NOTE — Patient Outreach (Signed)
Lonnie Hansen was transferred over through the [employer] to [place] [facility].  He is already assigned to [name], Therapist, sports.   ? ?Arville Care, CBCS, CMAA ?[place] Management Assistant ?[place] ?[phone]  ?

## 2021-05-08 ENCOUNTER — Other Ambulatory Visit: Payer: Self-pay | Admitting: *Deleted

## 2021-05-08 ENCOUNTER — Emergency Department (HOSPITAL_COMMUNITY)
Admission: EM | Admit: 2021-05-08 | Discharge: 2021-05-08 | Discharge status: Against Medical Advice | Payer: Medicare Other | Source: Home / Self Care | Attending: Emergency Medicine | Admitting: Emergency Medicine

## 2021-05-08 DIAGNOSIS — Z7982 Long term (current) use of aspirin: Secondary | ICD-10-CM | POA: Diagnosis not present

## 2021-05-08 DIAGNOSIS — K5730 Diverticulosis of large intestine without perforation or abscess without bleeding: Secondary | ICD-10-CM | POA: Diagnosis not present

## 2021-05-08 DIAGNOSIS — K5900 Constipation, unspecified: Secondary | ICD-10-CM | POA: Diagnosis present

## 2021-05-08 DIAGNOSIS — D500 Iron deficiency anemia secondary to blood loss (chronic): Secondary | ICD-10-CM | POA: Diagnosis not present

## 2021-05-08 DIAGNOSIS — Z5321 Procedure and treatment not carried out due to patient leaving prior to being seen by health care provider: Secondary | ICD-10-CM | POA: Insufficient documentation

## 2021-05-08 DIAGNOSIS — Z20822 Contact with and (suspected) exposure to covid-19: Secondary | ICD-10-CM | POA: Diagnosis not present

## 2021-05-08 DIAGNOSIS — K6389 Other specified diseases of intestine: Secondary | ICD-10-CM | POA: Diagnosis not present

## 2021-05-08 DIAGNOSIS — K625 Hemorrhage of anus and rectum: Secondary | ICD-10-CM | POA: Diagnosis not present

## 2021-05-08 DIAGNOSIS — Z79899 Other long term (current) drug therapy: Secondary | ICD-10-CM | POA: Diagnosis not present

## 2021-05-08 DIAGNOSIS — Z87891 Personal history of nicotine dependence: Secondary | ICD-10-CM | POA: Diagnosis not present

## 2021-05-08 DIAGNOSIS — K5731 Diverticulosis of large intestine without perforation or abscess with bleeding: Secondary | ICD-10-CM | POA: Diagnosis not present

## 2021-05-08 DIAGNOSIS — R202 Paresthesia of skin: Secondary | ICD-10-CM | POA: Insufficient documentation

## 2021-05-08 DIAGNOSIS — K5790 Diverticulosis of intestine, part unspecified, without perforation or abscess without bleeding: Secondary | ICD-10-CM | POA: Diagnosis not present

## 2021-05-08 DIAGNOSIS — K219 Gastro-esophageal reflux disease without esophagitis: Secondary | ICD-10-CM | POA: Diagnosis not present

## 2021-05-08 DIAGNOSIS — I723 Aneurysm of iliac artery: Secondary | ICD-10-CM | POA: Diagnosis not present

## 2021-05-08 DIAGNOSIS — Z9989 Dependence on other enabling machines and devices: Secondary | ICD-10-CM | POA: Diagnosis not present

## 2021-05-08 DIAGNOSIS — D649 Anemia, unspecified: Secondary | ICD-10-CM | POA: Diagnosis not present

## 2021-05-08 DIAGNOSIS — K921 Melena: Secondary | ICD-10-CM | POA: Diagnosis not present

## 2021-05-08 DIAGNOSIS — D62 Acute posthemorrhagic anemia: Secondary | ICD-10-CM | POA: Diagnosis not present

## 2021-05-08 DIAGNOSIS — I10 Essential (primary) hypertension: Secondary | ICD-10-CM | POA: Diagnosis not present

## 2021-05-08 DIAGNOSIS — K5720 Diverticulitis of large intestine with perforation and abscess without bleeding: Secondary | ICD-10-CM | POA: Diagnosis not present

## 2021-05-08 DIAGNOSIS — G4733 Obstructive sleep apnea (adult) (pediatric): Secondary | ICD-10-CM | POA: Diagnosis not present

## 2021-05-08 DIAGNOSIS — Z8249 Family history of ischemic heart disease and other diseases of the circulatory system: Secondary | ICD-10-CM | POA: Diagnosis not present

## 2021-05-08 DIAGNOSIS — Z952 Presence of prosthetic heart valve: Secondary | ICD-10-CM | POA: Diagnosis not present

## 2021-05-08 DIAGNOSIS — K635 Polyp of colon: Secondary | ICD-10-CM | POA: Diagnosis not present

## 2021-05-08 DIAGNOSIS — I2510 Atherosclerotic heart disease of native coronary artery without angina pectoris: Secondary | ICD-10-CM | POA: Diagnosis not present

## 2021-05-08 DIAGNOSIS — D693 Immune thrombocytopenic purpura: Secondary | ICD-10-CM | POA: Diagnosis not present

## 2021-05-08 DIAGNOSIS — K922 Gastrointestinal hemorrhage, unspecified: Secondary | ICD-10-CM | POA: Diagnosis not present

## 2021-05-08 DIAGNOSIS — Z8349 Family history of other endocrine, nutritional and metabolic diseases: Secondary | ICD-10-CM | POA: Diagnosis not present

## 2021-05-08 DIAGNOSIS — E785 Hyperlipidemia, unspecified: Secondary | ICD-10-CM | POA: Diagnosis present

## 2021-05-08 DIAGNOSIS — I7143 Infrarenal abdominal aortic aneurysm, without rupture: Secondary | ICD-10-CM | POA: Diagnosis present

## 2021-05-08 DIAGNOSIS — K648 Other hemorrhoids: Secondary | ICD-10-CM | POA: Diagnosis not present

## 2021-05-08 LAB — TYPE AND SCREEN
ABO/RH(D): AB POS
Antibody Screen: NEGATIVE

## 2021-05-08 LAB — COMPREHENSIVE METABOLIC PANEL
ALT: 15 U/L (ref 0–44)
AST: 24 U/L (ref 15–41)
Albumin: 4.6 g/dL (ref 3.5–5.0)
Alkaline Phosphatase: 43 U/L (ref 38–126)
Anion gap: 9 (ref 5–15)
BUN: 13 mg/dL (ref 8–23)
CO2: 24 mmol/L (ref 22–32)
Calcium: 9.5 mg/dL (ref 8.9–10.3)
Chloride: 106 mmol/L (ref 98–111)
Creatinine, Ser: 1.15 mg/dL (ref 0.61–1.24)
GFR, Estimated: >60 mL/min (ref >60)
Glucose, Bld: 118 mg/dL — ABNORMAL HIGH (ref 70–99)
Potassium: 3.4 mmol/L — ABNORMAL LOW (ref 3.5–5.1)
Sodium: 139 mmol/L (ref 135–145)
Total Bilirubin: 0.8 mg/dL (ref 0.3–1.2)
Total Protein: 7.5 g/dL (ref 6.5–8.1)

## 2021-05-08 LAB — CBC
HCT: 37.2 % — ABNORMAL LOW (ref 39.0–52.0)
Hemoglobin: 12.4 g/dL — ABNORMAL LOW (ref 13.0–17.0)
MCH: 31.4 pg (ref 26.0–34.0)
MCHC: 33.3 g/dL (ref 30.0–36.0)
MCV: 94.2 fL (ref 80.0–100.0)
Platelets: 149 10*3/uL — ABNORMAL LOW (ref 150–400)
RBC: 3.95 MIL/uL — ABNORMAL LOW (ref 4.22–5.81)
RDW: 13.5 % (ref 11.5–15.5)
WBC: 7.6 10*3/uL (ref 4.0–10.5)
nRBC: 0 % (ref 0.0–0.2)

## 2021-05-08 NOTE — ED Notes (Signed)
Patient was awear he was  a rectal bleed tried to get patient to stay ,patient left without being seen by a docto,r ?

## 2021-05-08 NOTE — ED Notes (Signed)
X2 no response °

## 2021-05-08 NOTE — Patient Outreach (Signed)
[facility] Care Management ? ?05/08/2021 ? ?[name] ?[birth year] ?[id number] ? ? ?Noted that member presented to the ED this morning for rectal bleeding but left without being see.   ? ?Outreach attempt #2, successful however he report he is currently on the phone, unable to talk and will call back.  If no call back, will send outreach letter and follow up within the next 3-4 business days. ? ? ?Update:  ? ?Incoming call received back from member.  Denies any urgent concerns, encouraged to contact this care manager with questions.   ? ?Care Plan : General Plan of Care (Adult)  ?Updates made by [name], RN since 05/08/2021 12:00 AM  ?  ? ?Problem: Knowledge deficit related to management of chronic medical conditions (ITP and HTN)   ?Priority: High  ?  ? ?Long-Range Goal: Patient will verbalize and display chronic health management evidenced by no hospital admissions in the next 6 months   ?Start Date: 09/19/2020  ?Expected End Date: 09/19/2021  ?This Visit's Progress: On track  ?Recent Progress: On track  ?Priority: High  ?Note:   ?Current Barriers:  ?Knowledge Deficits related to plan of care for management of HTN, HLD, and ITP  ?Chronic Disease Management support and education needs related to HTN, HLD, and ITP ? ?RNCM Clinical Goal(s):  ?Patient will verbalize understanding of plan for management of HTN and ITP ?take all medications exactly as prescribed and will call provider for medication related questions ?attend all scheduled medical appointments: PCP, Cardiology, and Hematology ?continue to work with RN Care Manager to address care management and care coordination needs related to HTN and ITP  through collaboration with RN Care manager, provider, and care team.  ? ?Interventions: ?Inter-dis1:1 collaboration with primary care provider regarding development and update of comprehensive plan of care as evidenced by provider attestation and co-signatureciplinary care team collaboration  (see longitudinal plan of care) ?Evaluation of current treatment plan related to  self management and patient's adherence to plan as established by provider ? ? ?Hypertension: (Status: Condition stable. Not addressed this visit.) ?Last practice recorded BP readings:  ?BP Readings from Last 3 Encounters:  ?05/08/21 (!) 142/88  ?04/25/21 (!) 144/53  ?04/16/21 133/79  ?Most recent eGFR/CrCl: No results found for: EGFR  No components found for: CRCL ? ?Evaluation of current treatment plan related to hypertension self management and patient's adherence to plan as established by provider;   ?Reviewed prescribed diet low salt ?Advised patient, providing education and rationale, to monitor blood pressure daily and record, calling PCP for findings outside established parameters;  ?Discussed complications of poorly controlled blood pressure such as heart disease, stroke, circulatory complications, vision complications, kidney impairment, sexual dysfunction;  ?Screening for signs and symptoms of depression related to chronic disease state;  ? ?11/8 - Admits that he is not consistent with monitoring BP daily.  Strongly encouraged to do so and record readings, state he will try.   ? ? ?Hyperlipidemia Interventions:  (Status:  Goal on track:  Yes.) Long Term Goal ?Medication review performed; medication list updated in electronic medical record.  ?Provider established cholesterol goals reviewed ?Counseled on importance of regular laboratory monitoring as prescribed ?Provided HLD educational materials ? ?ITPGoal on track:  Yes. ?Evaluation of current treatment plan related to  ITP , self-management and patient's adherence to plan as established by provider. ?Discussed plans with patient for ongoing care management follow up and provided patient with direct contact information for care management team ?Advised patient to  call provider with any signs of bleeding.  Platetlets in August were 99,000, on 11/3 decreased to  78,000; ?Provided education to patient re: management of ITP and chronic anemia; ?Reviewed medications with patient and discussed schedule of follow up, including next set of labs and pending injection; ? ?11/8 - Member state he has several follow up appointments in the next few weeks.  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, 12/5 with cardiology, and 1/3 with oncology for labs and office visit ? ? ?Update 12/5 - Was recently hospitalized for gall bladder issues, surgery to remove was completed.  State he is doing well, no complications post op.  Was seen by surgeon on 12/2, no follow up needed.  Will see hematology and cardiology today, last platelet count was 92 and stable. ? ?Patient Goals/Self-Care Activities: ?Patient will attend all scheduled provider appointments ?Patient will call pharmacy for medication refills ?Patient will call provider office for new concerns or questions ?  ? ? 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  Will have appointment with PCP next week, will contact this care manager with any concerns. ? ? ?Update 2/10 - Annual PCP visit completed on 1/24, member report only concern was elevated Triglycerides.  Educated on proper diet and exercise, verbalizes understanding.  Will follow up with hematology on 3/3, last platelet count was 97 in January, injection received.  He is hoping to be able to keep count greater than 100 with minimal injections.  Otherwise, no concerns voiced. ? ? ?Update 3/14 - Spoke with member, report he has adjusted diet to low cholesterol/heart healthy diet.  He received another injection a couple weeks ago for decreased platelet count of 76.  He is scheduled for repeat labs on 3/17 with injection.  This will be every 2 weeks to keep level stable.  He voices concern regarding known  AAA.  Was last seen by vascular in April last year, size of AAA was 5 cm with potential plan for intervention if it became greater than 5.5 cm.  He has not had 6 month follow up as suggested, provided with contact information to office and encouraged to call for appointment.  State he will do so today (update - done, test scheduled for 3/17, office visit on 3/22).  Denies any pain or discomfort at this time. ? ? ?Update 4/13 - Member was seen in ED today for rectal bleeding, left without being seen after waiting in lobby for 8 hours.  State he called his provider at [facility] and was prescribed a steroid suppository and given an appointment to come in the office in the next 2 weeks.  He is aware of the need to seek medical attention should bleeding get worse, will try urgent care or one of the [facility].  State his blood levels were "fine" in the ED.  Denies any dizziness or fatigue.   ? ?Cardiology appointment to assess AAA completed, decision per notes is to continue to monitor and re-assess in the next few months.   ? ? ?  ? ? ? ?[name], RN, MSN, CCM ?[facility] Care Management  ?Community Care Manager ?[phone] ? ?

## 2021-05-08 NOTE — ED Triage Notes (Signed)
Pt with hx of diverticulitis here for eval of BRBPR and dark tarry stool since last night. Denies dizziness or lightheadedness but reports fingers are tingling.  ?

## 2021-05-09 ENCOUNTER — Inpatient Hospital Stay: Payer: Medicare Other | Attending: Hematology & Oncology

## 2021-05-09 ENCOUNTER — Inpatient Hospital Stay: Payer: Medicare Other

## 2021-05-09 DIAGNOSIS — D693 Immune thrombocytopenic purpura: Secondary | ICD-10-CM

## 2021-05-09 LAB — CMP (CANCER CENTER ONLY)
ALT: 12 U/L (ref 0–44)
AST: 19 U/L (ref 15–41)
Albumin: 4.4 g/dL (ref 3.5–5.0)
Alkaline Phosphatase: 45 U/L (ref 38–126)
Anion gap: 9 (ref 5–15)
BUN: 15 mg/dL (ref 8–23)
CO2: 28 mmol/L (ref 22–32)
Calcium: 9.7 mg/dL (ref 8.9–10.3)
Chloride: 101 mmol/L (ref 98–111)
Creatinine: 1.15 mg/dL (ref 0.61–1.24)
GFR, Estimated: >60 mL/min (ref >60)
Glucose, Bld: 105 mg/dL — ABNORMAL HIGH (ref 70–99)
Potassium: 3.7 mmol/L (ref 3.5–5.1)
Sodium: 138 mmol/L (ref 135–145)
Total Bilirubin: 0.7 mg/dL (ref 0.3–1.2)
Total Protein: 7 g/dL (ref 6.5–8.1)

## 2021-05-09 LAB — CBC WITH DIFFERENTIAL (CANCER CENTER ONLY)
Abs Immature Granulocytes: 0.12 10*3/uL — ABNORMAL HIGH (ref 0.00–0.07)
Basophils Absolute: 0 10*3/uL (ref 0.0–0.1)
Basophils Relative: 0 %
Eosinophils Absolute: 0.1 10*3/uL (ref 0.0–0.5)
Eosinophils Relative: 2 %
HCT: 33.2 % — ABNORMAL LOW (ref 39.0–52.0)
Hemoglobin: 11.1 g/dL — ABNORMAL LOW (ref 13.0–17.0)
Immature Granulocytes: 2 %
Lymphocytes Relative: 18 %
Lymphs Abs: 1.4 10*3/uL (ref 0.7–4.0)
MCH: 31.1 pg (ref 26.0–34.0)
MCHC: 33.4 g/dL (ref 30.0–36.0)
MCV: 93 fL (ref 80.0–100.0)
Monocytes Absolute: 0.8 10*3/uL (ref 0.1–1.0)
Monocytes Relative: 11 %
Neutro Abs: 4.9 10*3/uL (ref 1.7–7.7)
Neutrophils Relative %: 67 %
Platelet Count: 123 10*3/uL — ABNORMAL LOW (ref 150–400)
RBC: 3.57 MIL/uL — ABNORMAL LOW (ref 4.22–5.81)
RDW: 13.3 % (ref 11.5–15.5)
WBC Count: 7.4 10*3/uL (ref 4.0–10.5)
nRBC: 0 % (ref 0.0–0.2)

## 2021-05-10 ENCOUNTER — Telehealth (HOSPITAL_BASED_OUTPATIENT_CLINIC_OR_DEPARTMENT_OTHER): Payer: Self-pay | Admitting: *Deleted

## 2021-05-10 ENCOUNTER — Encounter (HOSPITAL_BASED_OUTPATIENT_CLINIC_OR_DEPARTMENT_OTHER): Payer: Self-pay | Admitting: *Deleted

## 2021-05-10 ENCOUNTER — Inpatient Hospital Stay (HOSPITAL_BASED_OUTPATIENT_CLINIC_OR_DEPARTMENT_OTHER)
Admission: EM | Admit: 2021-05-10 | Discharge: 2021-05-14 | DRG: 378 | Disposition: A | Discharge status: Home / Self Care | Payer: Medicare Other | Attending: Internal Medicine | Admitting: Internal Medicine

## 2021-05-10 ENCOUNTER — Other Ambulatory Visit: Payer: Self-pay

## 2021-05-10 DIAGNOSIS — G4733 Obstructive sleep apnea (adult) (pediatric): Secondary | ICD-10-CM | POA: Diagnosis not present

## 2021-05-10 DIAGNOSIS — K219 Gastro-esophageal reflux disease without esophagitis: Secondary | ICD-10-CM | POA: Diagnosis present

## 2021-05-10 DIAGNOSIS — D693 Immune thrombocytopenic purpura: Secondary | ICD-10-CM | POA: Diagnosis not present

## 2021-05-10 DIAGNOSIS — E785 Hyperlipidemia, unspecified: Secondary | ICD-10-CM | POA: Diagnosis present

## 2021-05-10 DIAGNOSIS — K5731 Diverticulosis of large intestine without perforation or abscess with bleeding: Principal | ICD-10-CM | POA: Diagnosis present

## 2021-05-10 DIAGNOSIS — Z952 Presence of prosthetic heart valve: Secondary | ICD-10-CM

## 2021-05-10 DIAGNOSIS — I723 Aneurysm of iliac artery: Secondary | ICD-10-CM | POA: Diagnosis not present

## 2021-05-10 DIAGNOSIS — K5790 Diverticulosis of intestine, part unspecified, without perforation or abscess without bleeding: Secondary | ICD-10-CM | POA: Diagnosis not present

## 2021-05-10 DIAGNOSIS — D62 Acute posthemorrhagic anemia: Secondary | ICD-10-CM | POA: Diagnosis present

## 2021-05-10 DIAGNOSIS — Z8349 Family history of other endocrine, nutritional and metabolic diseases: Secondary | ICD-10-CM

## 2021-05-10 DIAGNOSIS — I7143 Infrarenal abdominal aortic aneurysm, without rupture: Secondary | ICD-10-CM | POA: Diagnosis not present

## 2021-05-10 DIAGNOSIS — Z20822 Contact with and (suspected) exposure to covid-19: Secondary | ICD-10-CM | POA: Diagnosis present

## 2021-05-10 DIAGNOSIS — Z87891 Personal history of nicotine dependence: Secondary | ICD-10-CM

## 2021-05-10 DIAGNOSIS — Z79899 Other long term (current) drug therapy: Secondary | ICD-10-CM

## 2021-05-10 DIAGNOSIS — Z8249 Family history of ischemic heart disease and other diseases of the circulatory system: Secondary | ICD-10-CM

## 2021-05-10 DIAGNOSIS — K625 Hemorrhage of anus and rectum: Secondary | ICD-10-CM | POA: Diagnosis not present

## 2021-05-10 DIAGNOSIS — Z7982 Long term (current) use of aspirin: Secondary | ICD-10-CM

## 2021-05-10 DIAGNOSIS — K5900 Constipation, unspecified: Secondary | ICD-10-CM | POA: Diagnosis present

## 2021-05-10 DIAGNOSIS — I10 Essential (primary) hypertension: Secondary | ICD-10-CM | POA: Diagnosis present

## 2021-05-10 DIAGNOSIS — Z9989 Dependence on other enabling machines and devices: Secondary | ICD-10-CM | POA: Diagnosis not present

## 2021-05-10 DIAGNOSIS — K5791 Diverticulosis of intestine, part unspecified, without perforation or abscess with bleeding: Secondary | ICD-10-CM | POA: Diagnosis present

## 2021-05-10 LAB — COMPREHENSIVE METABOLIC PANEL
ALT: 10 U/L (ref 0–44)
AST: 18 U/L (ref 15–41)
Albumin: 4.5 g/dL (ref 3.5–5.0)
Alkaline Phosphatase: 39 U/L (ref 38–126)
Anion gap: 10 (ref 5–15)
BUN: 15 mg/dL (ref 8–23)
CO2: 25 mmol/L (ref 22–32)
Calcium: 9.8 mg/dL (ref 8.9–10.3)
Chloride: 102 mmol/L (ref 98–111)
Creatinine, Ser: 1.18 mg/dL (ref 0.61–1.24)
GFR, Estimated: >60 mL/min (ref >60)
Glucose, Bld: 128 mg/dL — ABNORMAL HIGH (ref 70–99)
Potassium: 3.4 mmol/L — ABNORMAL LOW (ref 3.5–5.1)
Sodium: 137 mmol/L (ref 135–145)
Total Bilirubin: 0.7 mg/dL (ref 0.3–1.2)
Total Protein: 7.1 g/dL (ref 6.5–8.1)

## 2021-05-10 LAB — CBC WITH DIFFERENTIAL/PLATELET
Abs Immature Granulocytes: 0.03 10*3/uL (ref 0.00–0.07)
Basophils Absolute: 0 10*3/uL (ref 0.0–0.1)
Basophils Relative: 0 %
Eosinophils Absolute: 0.1 10*3/uL (ref 0.0–0.5)
Eosinophils Relative: 1 %
HCT: 29.9 % — ABNORMAL LOW (ref 39.0–52.0)
Hemoglobin: 10.1 g/dL — ABNORMAL LOW (ref 13.0–17.0)
Immature Granulocytes: 1 %
Lymphocytes Relative: 16 %
Lymphs Abs: 1.1 10*3/uL (ref 0.7–4.0)
MCH: 30.7 pg (ref 26.0–34.0)
MCHC: 33.8 g/dL (ref 30.0–36.0)
MCV: 90.9 fL (ref 80.0–100.0)
Monocytes Absolute: 0.7 10*3/uL (ref 0.1–1.0)
Monocytes Relative: 10 %
Neutro Abs: 4.8 10*3/uL (ref 1.7–7.7)
Neutrophils Relative %: 72 %
Platelets: 133 10*3/uL — ABNORMAL LOW (ref 150–400)
RBC: 3.29 MIL/uL — ABNORMAL LOW (ref 4.22–5.81)
RDW: 13.5 % (ref 11.5–15.5)
WBC: 6.6 10*3/uL (ref 4.0–10.5)
nRBC: 0 % (ref 0.0–0.2)

## 2021-05-10 LAB — OCCULT BLOOD X 1 CARD TO LAB, STOOL: Fecal Occult Bld: POSITIVE — AB

## 2021-05-10 MED ORDER — ATENOLOL 25 MG PO TABS
12.5000 mg | ORAL_TABLET | Freq: Two times a day (BID) | ORAL | Status: DC
  Administered 2021-05-10 – 2021-05-14 (×7): 12.5 mg via ORAL
  Filled 2021-05-10 (×8): qty 1

## 2021-05-10 MED ORDER — ROSUVASTATIN CALCIUM 10 MG PO TABS
10.0000 mg | ORAL_TABLET | Freq: Every evening | ORAL | Status: DC
  Administered 2021-05-10 – 2021-05-14 (×5): 10 mg via ORAL
  Filled 2021-05-10 (×5): qty 1

## 2021-05-10 MED ORDER — ACETAMINOPHEN 650 MG RE SUPP: 650.0000 mg | Freq: Four times a day (QID) | RECTAL | Status: DC | PRN

## 2021-05-10 MED ORDER — ACETAMINOPHEN 325 MG PO TABS
650.0000 mg | ORAL_TABLET | Freq: Four times a day (QID) | ORAL | Status: DC | PRN
  Administered 2021-05-10 – 2021-05-13 (×6): 650 mg via ORAL
  Filled 2021-05-10 (×6): qty 2

## 2021-05-10 MED ORDER — POTASSIUM CHLORIDE CRYS ER 20 MEQ PO TBCR
40.0000 meq | EXTENDED_RELEASE_TABLET | Freq: Once | ORAL | Status: AC
  Administered 2021-05-10: 40 meq via ORAL
  Filled 2021-05-10: qty 2

## 2021-05-10 MED ORDER — ONDANSETRON HCL 4 MG PO TABS: 4.0000 mg | ORAL_TABLET | Freq: Four times a day (QID) | ORAL | Status: DC | PRN

## 2021-05-10 MED ORDER — PANTOPRAZOLE SODIUM 40 MG IV SOLR
40.0000 mg | Freq: Two times a day (BID) | INTRAVENOUS | Status: DC
  Administered 2021-05-10 – 2021-05-14 (×8): 40 mg via INTRAVENOUS
  Filled 2021-05-10 (×8): qty 10

## 2021-05-10 MED ORDER — ONDANSETRON HCL 4 MG/2ML IJ SOLN
4.0000 mg | Freq: Four times a day (QID) | INTRAMUSCULAR | Status: DC | PRN
Start: 2021-05-10 — End: 2021-05-14

## 2021-05-10 MED ORDER — LATANOPROST 0.005 % OP SOLN
1.0000 [drp] | Freq: Two times a day (BID) | OPHTHALMIC | Status: DC
  Administered 2021-05-10 – 2021-05-14 (×8): 1 [drp] via OPHTHALMIC
  Filled 2021-05-10: qty 2.5

## 2021-05-10 NOTE — Assessment & Plan Note (Addendum)
Normal BUN. Secondary to diverticular bleeding confirmed by bleeding vessel seen on CTA abdomen/pelvis ?-Seen by General Surgery with no recs for surgery at this time ?-Seen by GI, now s/p colonoscopy 4/19 with diverticula noted ?

## 2021-05-10 NOTE — Assessment & Plan Note (Addendum)
Noted on history. Contributing to current presentation. ?

## 2021-05-10 NOTE — H&P (Signed)
?History and Physical  ? ? ?Patient: Lonnie Hansen INO:[id number] DOB: [birth year] ?DOA: 05/10/2021 ?DOS: the patient was seen and examined on 05/10/2021 ?PCP: [name], MD  ?Patient coming from: Home ? ?Chief Complaint:  ?Chief Complaint  ?Patient presents with  ? Rectal Bleeding  ? ?HPI:  ?Lonnie Hansen is a 71 y.o. male with a history of AAA, AVR, chronic ITP, GERD, hyperlipidemia, OSA on CPAP, hypertension, GI bleeding. Patient reports black clots and red blood per rectum starting about two days prior. He went to the emergency department on 4/14 but left without being seen because of his long wait. He reports being given some steroid suppositories which helped with his bleeding, however, bleeding recurred. He had an appointment with his oncologist's office and had blood work, which revealed a 1 point drop in his hemoglobin with an associated low potassium and he decided to come to the emergency department. ? ?Review of Systems: As mentioned in the history of present illness. All other systems reviewed and are negative. ?Past Medical History:  ?Diagnosis Date  ? AAA (abdominal aortic aneurysm) ([place])   ? Anemia   ? Arthritis   ? Asymptomatic bilateral carotid artery stenosis 08/2015  ? 1-39%   ? Cataracts, bilateral   ? 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  ? Coronary artery disease   ? Diverticulosis   ? Enlarged aorta ([facility])   ? Enlarged prostate   ? slightly  ? GERD (gastroesophageal reflux disease)   ? takes Pantoprazole daily as needed  ? Glaucoma   ? uses eye drops daily  ? Headache   ? History of colon polyps   ? benign  ? History of kidney stones   ? Hyperlipidemia   ? no on any meds  ? Hypertension   ? takes Amlodipine and Atenolol daily  ? OSA on CPAP   ? Vocal cord nodule   ? pt. states  it's a" growth on vocal cord"  ? ?Past Surgical History:  ?Procedure Laterality Date  ? AORTIC ARCH ANGIOGRAPHY N/A 04/16/2016  ? Procedure: Aortic Arch Angiography;  Surgeon: [name] Martinique,  MD;  Location: [facility];  Service: Cardiovascular;  Laterality: N/A;  ? AORTIC VALVE REPLACEMENT N/A 09/15/2016  ? Procedure: AORTIC VALVE REPLACEMENT (AVR);  Surgeon: Grace Isaac, MD;  Location: [facility];  Service: Open Heart Surgery;  Laterality: N/A;  Using 65m Edwards Perimount Magna Ease Aortic Bioprosthesis Valve  ? ASCENDING AORTIC ROOT REPLACEMENT N/A 09/15/2016  ? Procedure: ASCENDING AORTIC ROOT REPLACEMENT;  Surgeon: GGrace Isaac MD;  [facility] [facility]  Service: Open Heart Surgery;  Laterality: N/A;  Using 384mGelweave Valsalva Graft  ? CHOLECYSTECTOMY N/A 12/19/2020  ? Procedure: LAPAROSCOPIC CHOLECYSTECTOMY WITH POSSIBLE INTRAOPERATIVE CHOLANGIOGRAM;  Surgeon: WiGreer PickerelMD;  [place] [facility] Service: General;  Laterality: N/A;  ? COLONOSCOPY    ? COLONOSCOPY Left 04/16/2019  ? Procedure: COLONOSCOPY;  Surgeon: OuArta SilenceMD;  [facility] Service: Endoscopy;  Laterality: Left;  ? COLONOSCOPY N/A 06/01/2020  ? Procedure: COLONOSCOPY;  Surgeon: KaRonnette JuniperMD;  [place] Service: Gastroenterology;  Laterality: N/A;  ? COLONOSCOPY WITH ESOPHAGOGASTRODUODENOSCOPY (EGD)    ? ESOPHAGOGASTRODUODENOSCOPY N/A 05/25/2020  ? Procedure: ESOPHAGOGASTRODUODENOSCOPY (EGD);  Surgeon: ScWilford CornerMD;  [facility] Service: Endoscopy;  Laterality: N/A;  ? ESOPHAGOGASTRODUODENOSCOPY (EGD) WITH PROPOFOL Left 04/16/2019  ? Procedure: ESOPHAGOGASTRODUODENOSCOPY (EGD) WITH PROPOFOL;  Surgeon: OuArta SilenceMD;  [place] [facility] Service:  Endoscopy;  Laterality: Left;  ? FLEXIBLE SIGMOIDOSCOPY N/A 05/25/2020  ? Procedure: FLEXIBLE SIGMOIDOSCOPY;  Surgeon: Wilford Corner, MD;  [facility]COPY;  Service: Endoscopy;  Laterality: N/A;  ? IR THORACENTESIS ASP PLEURAL SPACE W/IMG GUIDE  10/23/2016  ? MICROLARYNGOSCOPY Right 09/20/2017  ? Procedure: MICROLARYNGOSCOPY WITH  EXCISION OF VOCAL CORD LESION;  Surgeon: Leta Baptist, MD;  Location: [facility];  Service: ENT;  Laterality: Right;  ? MULTIPLE EXTRACTIONS WITH ALVEOLOPLASTY N/A 06/10/2016  ? Procedure: Extraction of tooth #'s 2,8,13,15, and 29  with alveoloplasty, maxillary right and left buccal exostoses reductions, and gross debridement of remaining teeth.;  Surgeon: Lenn Cal, DDS;  Location: [facility];  Service: Oral Surgery;  Laterality: N/A;  ? RIGHT/LEFT HEART CATH AND CORONARY ANGIOGRAPHY N/A 04/16/2016  ? Procedure: Right/Left Heart Cath and Coronary Angiography;  Surgeon: [name] Martinique, MD;  Location: [facility];  Service: Cardiovascular;  Laterality: N/A;  ? TEE WITHOUT CARDIOVERSION N/A 09/15/2016  ? Procedure: TRANSESOPHAGEAL ECHOCARDIOGRAM (TEE);  Surgeon: Grace Isaac, MD;  Location: [facility];  Service: Open Heart Surgery;  Laterality: N/A;  ? ?Social History:  reports that he has quit smoking. His smoking use included cigarettes. He has a 25.00 pack-year smoking history. He has never used smokeless tobacco. He reports that he does not drink alcohol and does not use drugs. ? ?No Known Allergies ? ?Family History  ?Problem Relation Age of Onset  ? Heart disease Father   ? Heart attack Father   ? Thyroid disease Sister   ? ? ?Prior to Admission medications   ?Medication Sig Start Date End Date Taking? Authorizing Provider  ?acetaminophen (TYLENOL) 325 MG tablet Take 2 tablets (650 mg total) by mouth every 6 (six) hours as needed for mild pain or fever. 05/29/20   Estill Cotta, NP  ?acetaminophen (TYLENOL) 500 MG tablet Take 1,000 mg by mouth every 6 (six) hours as needed for moderate pain.    [provider]  ?amLODipine (NORVASC) 10 MG tablet Take 10 mg by mouth every evening.    [provider]  ?ANUCORT-HC 25 MG suppository Place 1 suppository rectally daily as needed for pain. 04/29/21   [provider]  ?atenolol (TENORMIN) 25 MG tablet Take 12.5 mg by mouth 2 (two) times daily. 08/26/20   [name], MD  ?Ensure Plus (ENSURE  PLUS) LIQD Take 237 mLs by mouth daily.    [provider]  ?ferrous sulfate 325 (65 FE) MG tablet Take 325 mg by mouth 2 (two) times a week.    [provider]  ?latanoprost (XALATAN) 0.005 % ophthalmic solution Place 1 drop into both eyes 2 (two) times daily.    [provider]  ?Multiple Vitamin (MULTIVITAMIN WITH MINERALS) TABS tablet Take 1 tablet by mouth daily at 12 noon.    [provider]  ?omega-3 acid ethyl esters (LOVAZA) 1 g capsule Take 1 capsule by mouth daily. 03/03/21   [provider]  ?pantoprazole (PROTONIX) 40 MG tablet Take 40 mg by mouth 2 (two) times daily. 11/09/20   [provider]  ?polyethylene glycol (MIRALAX / GLYCOLAX) 17 g packet Take 17 g by mouth daily. ?Patient taking differently: Take 17 g by mouth daily as needed for moderate constipation. 06/04/20   [name], MD  ?psyllium (HYDROCIL/METAMUCIL) 95 % PACK Take 1 packet by mouth daily. 06/04/20   [name], MD  ?rosuvastatin (CRESTOR) 10 MG tablet Take 1 tablet by mouth once  daily ?Patient taking differently: Take 10 mg by mouth every evening. 05/17/20   Martinique, [name], MD  ?traMADol (ULTRAM) 50 MG tablet Take 1 tablet (50 mg total) by mouth every 6 (six) hours as needed. 12/18/20   Margarita Mail, PA-C  ?triamcinolone (KENALOG) 0.025 % cream Apply topically 2 (two) times daily. 09/06/20   [provider]  ?triamcinolone cream (KENALOG) 0.1 % Apply 1 application topically 2 (two) times daily as needed (facial itching/rash). 05/16/20   [provider]  ? ? ?Physical Exam: ?Vitals:  ? 05/10/21 1500 05/10/21 1530 05/10/21 1644 05/10/21 1748  ?BP: 136/87 (!) 140/96 134/85 134/85  ?Pulse: (!) 55 (!) 57 (!) 56 (!) 56  ?Resp: '16 14 17 17  '$ ?Temp:   98.2 ?F (36.8 ?C)   ?TempSrc:   Oral Oral  ?SpO2: 99% 99% 99%   ?Weight:    90.3 kg  ?Height:    '5\' 6"'$  (1.676 m)  ? ?General exam: Appears calm and comfortable ?Respiratory system: Clear to auscultation. Respiratory  effort normal. ?Cardiovascular system: S1 & S2 heard, RRR. No murmurs, rubs, gallops or clicks. ?Gastrointestinal system: Abdomen is nondistended, soft and nontender. No organomegaly or masses felt. Normal

## 2021-05-10 NOTE — Progress Notes (Signed)
Patient's case discussed with ER physician and his hospital computer chart and office computer chart reviewed and he almost certainly has a diverticular bleed but he also has known hemorrhoidal disease and ITP although his platelet count has been good for him lately and in the past he was on some nonsteroidals and aspirin and hopefully he has not been back on them and for now can have clear liquids and if bleeding continues consider CTA or nuclear bleeding scan and please let me know which hospital he is in and I will see him either later today or tomorrow but call me sooner if question or problem from a GI standpoint arise ?

## 2021-05-10 NOTE — Assessment & Plan Note (Addendum)
Continue Protonix °

## 2021-05-10 NOTE — ED Provider Notes (Signed)
?[facility] EMERG[facility] DEPT ?Provider Note ? ? ?CSN: [id number] ?Arrival date & time: 05/10/21  1234 ? ?  ? ?History ? ?Chief Complaint  ?Patient presents with  ? Rectal Bleeding  ? ? ?Lonnie Hansen is a 71 y.o. 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 ? ?Presents to the emergency department for complaint of rectal bleeding.  Patient states that he started having rectal bleeding on Thursday.  Bleeding has persisted since then.  Patient mostly notices bleeding when he is on the commode attempting to have a bowel movement.  Patient states that he has been passing bright red blood, dark red blood, and blood clots from his rectum.  Patient has been using steroid suppository and Metamucil with no improvement in symptoms.  Patient states that today he was "on the verge of lightheadedness and felt nervous."  Patient reports that he had 3 episodes of rectal bleeding today.  Patient reports that he had a BC powder on Tuesday which she is not supposed to take. ? ?Patient states that he went to [facility] earlier this week for symptoms but left without being seen by provider and to the oncology center.  Patient reports that he had lab tests drawn at both locations. ? ?Patient denies any fever, chills, abdominal pain, abdominal distention, nausea, vomiting, hematemesis, coffee-ground emesis, chest pain, shortness of breath, palpitations, dysuria, hematuria, urinary urgency, syncope. ? ? ?Rectal Bleeding ?Associated symptoms: light-headedness   ?Associated symptoms: no abdominal pain, no dizziness, no fever and no vomiting   ? ?  ? ?Home Medications ?Prior to Admission medications   ?Medication Sig Start Date End Date Taking? Authorizing Provider  ?acetaminophen (TYLENOL) 325 MG tablet Take 2  tablets (650 mg total) by mouth every 6 (six) hours as needed for mild pain or fever. 05/29/20   Estill Cotta, NP  ?acetaminophen (TYLENOL) 500 MG tablet Take 1,000 mg by mouth every 6 (six) hours as needed for moderate pain.    [provider]  ?amLODipine (NORVASC) 10 MG tablet Take 10 mg by mouth every evening.    [provider]  ?ANUCORT-HC 25 MG suppository Place 1 suppository rectally daily as needed for pain. 04/29/21   [provider]  ?atenolol (TENORMIN) 25 MG tablet Take 12.5 mg by mouth 2 (two) times daily. 08/26/20   [name], MD  ?Ensure Plus (ENSURE PLUS) LIQD Take 237 mLs by mouth daily.    [provider]  ?ferrous sulfate 325 (65 FE) MG tablet Take 325 mg by mouth 2 (two) times a week.    [provider]  ?latanoprost (XALATAN) 0.005 % ophthalmic solution Place 1 drop into both eyes 2 (two) times daily.    [provider]  ?Multiple Vitamin (MULTIVITAMIN WITH MINERALS) TABS tablet Take 1 tablet by mouth daily at 12 noon.    [provider]  ?omega-3 acid ethyl esters (LOVAZA) 1 g capsule Take 1 capsule by mouth daily. 03/03/21   [provider]  ?pantoprazole (PROTONIX) 40 MG tablet Take 40 mg by mouth 2 (two) times daily. 11/09/20   [provider]  ?polyethylene glycol (MIRALAX / GLYCOLAX) 17 g packet Take 17 g by mouth daily. ?Patient taking differently: Take 17 g by mouth daily as needed for moderate constipation. 06/04/20   [name], MD  ?psyllium (HYDROCIL/METAMUCIL) 95 %  PACK Take 1 packet by mouth daily. 06/04/20   [name], MD  ?rosuvastatin (CRESTOR) 10 MG tablet Take 1 tablet by mouth once daily ?Patient taking differently: Take 10 mg by mouth every evening. 05/17/20   Martinique, Chariah Bailey M, MD  ?traMADol (ULTRAM) 50 MG tablet Take 1 tablet (50 mg total) by mouth every 6 (six) hours as needed. 12/18/20   Margarita Mail, PA-C  ?triamcinolone (KENALOG) 0.025 % cream Apply topically 2 (two) times daily.  09/06/20   [provider]  ?triamcinolone cream (KENALOG) 0.1 % Apply 1 application topically 2 (two) times daily as needed (facial itching/rash). 05/16/20   [provider]  ?   ? ?Allergies    ?Patient has no known allergies.   ? ?Review of Systems   ?Review of Systems  ?Constitutional:  Negative for chills and fever.  ?Eyes:  Negative for visual disturbance.  ?Respiratory:  Negative for shortness of breath.   ?Cardiovascular:  Negative for chest pain and palpitations.  ?Gastrointestinal:  Positive for anal bleeding, blood in stool and hematochezia. Negative for abdominal distention, abdominal pain, constipation, diarrhea, nausea, rectal pain and vomiting.  ?Genitourinary:  Negative for difficulty urinating, dysuria, flank pain, frequency, hematuria, penile swelling, scrotal swelling and testicular pain.  ?Musculoskeletal:  Negative for back pain and neck pain.  ?Skin:  Negative for color change and rash.  ?Neurological:  Positive for light-headedness. Negative for dizziness, syncope and headaches.  ?Psychiatric/Behavioral:  Negative for confusion.   ? ?Physical Exam ?Updated Vital Signs ?BP (!) 146/94 (BP Location: Left Arm)   Pulse 80   Temp 98.4 ?F (36.9 ?C)   Resp 18   Wt 90.3 kg   SpO2 100%   BMI 30.26 kg/m?  ?Physical Exam ?Vitals and nursing note reviewed. Chaperone present: Dr. Doren Custard present as chaperone.  ?Constitutional:   ?   General: He is not in acute distress. ?   Appearance: He is not ill-appearing, toxic-appearing or diaphoretic.  ?HENT:  ?   Head: Normocephalic.  ?Eyes:  ?   General: No scleral icterus.    ?   Right eye: No discharge.     ?   Left eye: No discharge.  ?Cardiovascular:  ?   Rate and Rhythm: Normal rate.  ?   Pulses:     ?     Radial pulses are 2+ on the right side and 2+ on the left side.  ?Pulmonary:  ?   Effort: Pulmonary effort is normal.  ?Abdominal:  ?   General: Abdomen is protuberant. A surgical scar is present. Bowel sounds are normal. There is no  distension. There are no signs of injury.  ?   Palpations: Abdomen is soft. There is no mass or pulsatile mass.  ?   Tenderness: There is no abdominal tenderness. There is no guarding or rebound.  ?   Hernia: There is no hernia in the umbilical area or ventral area.  ?Genitourinary: ?   Rectum: Guaiac result positive. External hemorrhoid present. No mass, tenderness, anal fissure or internal hemorrhoid. Normal anal tone.  ?   Comments: External hemorrhoid at 3 o'clock position, hemorrhoid is not thrombosed or actively bleeding.  Bright red blood noted within rectal vault.  No stool noted in rectal vault. ?Skin: ?   General: Skin is warm and dry.  ?Neurological:  ?   General: No focal deficit present.  ?   Mental Status: He is alert and oriented to person, place, and time.  ?   GCS: GCS  eye subscore is 4. GCS verbal subscore is 5. GCS motor subscore is 6.  ?Psychiatric:     ?   Behavior: Behavior is cooperative.  ? ? ?ED Results / Procedures / Treatments   ?Labs ?(all labs ordered are listed, but only abnormal results are displayed) ?Labs Reviewed  ?COMPREHENSIVE METABOLIC PANEL - Abnormal; Notable for the following components:  ?    Result Value  ? Potassium 3.4 (*)   ? Glucose, Bld 128 (*)   ? All other components within normal limits  ?CBC WITH DIFFERENTIAL/PLATELET - Abnormal; Notable for the following components:  ? RBC 3.29 (*)   ? Hemoglobin 10.1 (*)   ? HCT 29.9 (*)   ? Platelets 133 (*)   ? All other components within normal limits  ?OCCULT BLOOD X 1 CARD TO LAB, STOOL - Abnormal; Notable for the following components:  ? Fecal Occult Bld POSITIVE (*)   ? All other components within normal limits  ?POC OCCULT BLOOD, ED  ? ? ?EKG ?None ? ?Radiology ?No results found. ? ?Procedures ?Procedures  ? ? ?Medications Ordered in ED ?Medications - No data to display ? ?ED Course/ Medical Decision Making/ A&P ?  ?                        ?Medical Decision Making ?Amount and/or Complexity of Data Reviewed ?Labs:  ordered. ? ?Risk ?Prescription drug management. ?Decision regarding hospitalization. ? ? ?Alert 71 year old male in no acute distress, nontoxic-appearing.  Presents to the emergency department with a chief complaint of re

## 2021-05-10 NOTE — Hospital Course (Addendum)
Lonnie Hansen is a 71 y.o. male with a history of AAA, AVR, chronic ITP, GERD, hyperlipidemia, OSA on CPAP, hypertension, GI bleeding. Patient reports black clots and red blood per rectum starting about two days prior with associated acute anemia concerning for active GI bleeding. GI consulted. CTA abdomen/pelvis significant for sigmoid colon bleed. GI recommending general surgery consult for consideration of left hemicolectomy. General surgery have deferred consideration of hemicolectomy at this time. GI bleeding appears to be improved. ?

## 2021-05-10 NOTE — Assessment & Plan Note (Addendum)
Noted. Platelets of 133 on admission and drifting down slightly. Patient follows with [name] as an outpatient. ?

## 2021-05-10 NOTE — ED Notes (Signed)
Called to give report to nurse on [place], [place]. Unavailable, to return call.  ?

## 2021-05-10 NOTE — Assessment & Plan Note (Addendum)
Patient is on amlodipine and atenolol as an outpatient. Blood pressure stable ?-Cont home meds on d/c ?

## 2021-05-10 NOTE — ED Triage Notes (Signed)
Pt is here for evaluation of rectal bleeding since Thursday.  Pt states that he has had some clots as well as some tarry stoools.  Pt went to [facility] Thursday but had to leave after 7 hours due to long wait.  No abdominal pain with this.  Pt reports last episode was dark tarry stool that was red when he wiped and this occurred about one pta.  ?

## 2021-05-10 NOTE — Assessment & Plan Note (Signed)
Continue Crestor 

## 2021-05-10 NOTE — Assessment & Plan Note (Addendum)
Secondary to GI bleeding. Baseline hemoglobin of about 12. Hemoglobin down to 7.7 on 4/17 with subsequent 1 unit of pRBC transfused. ?-Hgb has since remained stable post-transfusion ?-Pt underwent colonoscopy per GI on 4/19 with findings of diverticulosis with one polyp. Discussed with GI. OK to d/c today ?-Appreciate input by General Surgery. ?

## 2021-05-10 NOTE — Assessment & Plan Note (Signed)
CPAP qhs 

## 2021-05-11 ENCOUNTER — Encounter (HOSPITAL_COMMUNITY): Payer: Self-pay | Admitting: Student

## 2021-05-11 ENCOUNTER — Inpatient Hospital Stay (HOSPITAL_COMMUNITY): Payer: Medicare Other

## 2021-05-11 DIAGNOSIS — I723 Aneurysm of iliac artery: Secondary | ICD-10-CM | POA: Diagnosis present

## 2021-05-11 DIAGNOSIS — K5790 Diverticulosis of intestine, part unspecified, without perforation or abscess without bleeding: Secondary | ICD-10-CM | POA: Diagnosis not present

## 2021-05-11 DIAGNOSIS — K5900 Constipation, unspecified: Secondary | ICD-10-CM | POA: Diagnosis present

## 2021-05-11 DIAGNOSIS — K5730 Diverticulosis of large intestine without perforation or abscess without bleeding: Secondary | ICD-10-CM | POA: Diagnosis not present

## 2021-05-11 DIAGNOSIS — Z952 Presence of prosthetic heart valve: Secondary | ICD-10-CM | POA: Diagnosis not present

## 2021-05-11 DIAGNOSIS — Z8349 Family history of other endocrine, nutritional and metabolic diseases: Secondary | ICD-10-CM | POA: Diagnosis not present

## 2021-05-11 DIAGNOSIS — K5720 Diverticulitis of large intestine with perforation and abscess without bleeding: Secondary | ICD-10-CM | POA: Diagnosis not present

## 2021-05-11 DIAGNOSIS — K625 Hemorrhage of anus and rectum: Secondary | ICD-10-CM | POA: Diagnosis present

## 2021-05-11 DIAGNOSIS — D62 Acute posthemorrhagic anemia: Secondary | ICD-10-CM | POA: Diagnosis present

## 2021-05-11 DIAGNOSIS — K648 Other hemorrhoids: Secondary | ICD-10-CM | POA: Diagnosis not present

## 2021-05-11 DIAGNOSIS — D500 Iron deficiency anemia secondary to blood loss (chronic): Secondary | ICD-10-CM | POA: Diagnosis not present

## 2021-05-11 DIAGNOSIS — D693 Immune thrombocytopenic purpura: Secondary | ICD-10-CM | POA: Diagnosis present

## 2021-05-11 DIAGNOSIS — K5731 Diverticulosis of large intestine without perforation or abscess with bleeding: Secondary | ICD-10-CM | POA: Diagnosis present

## 2021-05-11 DIAGNOSIS — Z20822 Contact with and (suspected) exposure to covid-19: Secondary | ICD-10-CM | POA: Diagnosis present

## 2021-05-11 DIAGNOSIS — G4733 Obstructive sleep apnea (adult) (pediatric): Secondary | ICD-10-CM | POA: Diagnosis present

## 2021-05-11 DIAGNOSIS — K6389 Other specified diseases of intestine: Secondary | ICD-10-CM | POA: Diagnosis not present

## 2021-05-11 DIAGNOSIS — D649 Anemia, unspecified: Secondary | ICD-10-CM | POA: Diagnosis not present

## 2021-05-11 DIAGNOSIS — K635 Polyp of colon: Secondary | ICD-10-CM | POA: Diagnosis not present

## 2021-05-11 DIAGNOSIS — E785 Hyperlipidemia, unspecified: Secondary | ICD-10-CM | POA: Diagnosis present

## 2021-05-11 DIAGNOSIS — I7143 Infrarenal abdominal aortic aneurysm, without rupture: Secondary | ICD-10-CM | POA: Diagnosis present

## 2021-05-11 DIAGNOSIS — Z8249 Family history of ischemic heart disease and other diseases of the circulatory system: Secondary | ICD-10-CM | POA: Diagnosis not present

## 2021-05-11 DIAGNOSIS — I10 Essential (primary) hypertension: Secondary | ICD-10-CM | POA: Diagnosis present

## 2021-05-11 DIAGNOSIS — Z87891 Personal history of nicotine dependence: Secondary | ICD-10-CM | POA: Diagnosis not present

## 2021-05-11 DIAGNOSIS — K219 Gastro-esophageal reflux disease without esophagitis: Secondary | ICD-10-CM | POA: Diagnosis present

## 2021-05-11 DIAGNOSIS — Z79899 Other long term (current) drug therapy: Secondary | ICD-10-CM | POA: Diagnosis not present

## 2021-05-11 DIAGNOSIS — K922 Gastrointestinal hemorrhage, unspecified: Secondary | ICD-10-CM | POA: Diagnosis not present

## 2021-05-11 DIAGNOSIS — K921 Melena: Secondary | ICD-10-CM | POA: Diagnosis not present

## 2021-05-11 DIAGNOSIS — I2510 Atherosclerotic heart disease of native coronary artery without angina pectoris: Secondary | ICD-10-CM | POA: Diagnosis not present

## 2021-05-11 DIAGNOSIS — Z9989 Dependence on other enabling machines and devices: Secondary | ICD-10-CM | POA: Diagnosis not present

## 2021-05-11 DIAGNOSIS — Z7982 Long term (current) use of aspirin: Secondary | ICD-10-CM | POA: Diagnosis not present

## 2021-05-11 LAB — HEMOGLOBIN AND HEMATOCRIT, BLOOD
HCT: 26.8 % — ABNORMAL LOW (ref 39.0–52.0)
Hemoglobin: 8.7 g/dL — ABNORMAL LOW (ref 13.0–17.0)

## 2021-05-11 LAB — CBC
HCT: 27.2 % — ABNORMAL LOW (ref 39.0–52.0)
Hemoglobin: 8.9 g/dL — ABNORMAL LOW (ref 13.0–17.0)
MCH: 30.9 pg (ref 26.0–34.0)
MCHC: 32.7 g/dL (ref 30.0–36.0)
MCV: 94.4 fL (ref 80.0–100.0)
Platelets: 127 10*3/uL — ABNORMAL LOW (ref 150–400)
RBC: 2.88 MIL/uL — ABNORMAL LOW (ref 4.22–5.81)
RDW: 13.6 % (ref 11.5–15.5)
WBC: 5.6 10*3/uL (ref 4.0–10.5)
nRBC: 0 % (ref 0.0–0.2)

## 2021-05-11 LAB — BASIC METABOLIC PANEL
Anion gap: 8 (ref 5–15)
BUN: 16 mg/dL (ref 8–23)
CO2: 26 mmol/L (ref 22–32)
Calcium: 9.1 mg/dL (ref 8.9–10.3)
Chloride: 107 mmol/L (ref 98–111)
Creatinine, Ser: 1.15 mg/dL (ref 0.61–1.24)
GFR, Estimated: >60 mL/min (ref >60)
Glucose, Bld: 107 mg/dL — ABNORMAL HIGH (ref 70–99)
Potassium: 3.6 mmol/L (ref 3.5–5.1)
Sodium: 141 mmol/L (ref 135–145)

## 2021-05-11 MED ORDER — IOHEXOL 350 MG/ML SOLN
100.0000 mL | Freq: Once | INTRAVENOUS | Status: AC | PRN
  Administered 2021-05-11: 100 mL via INTRAVENOUS

## 2021-05-11 MED ORDER — SODIUM CHLORIDE (PF) 0.9 % IJ SOLN
INTRAMUSCULAR | Status: AC
  Filled 2021-05-11: qty 50

## 2021-05-11 NOTE — Consult Note (Signed)
Reason for Consult: Probable diverticular bleeding ?Referring Physician: Hospital team ? ?Lonnie Hansen is an 71 y.o. male.  ?HPI: Patient known to me from previous diverticular bleeding episodes and occasional outpatient follow-up in his hospital computer chart and our office computer chart was reviewed and he periodically has some hemorrhoidal bleeding helped by suppositories but on Wednesday he had some lower abdominal cramps and some heavier bright red blood started and he initially went to the ER on Friday but left without being seen and when his bleeding continued about 3 episodes a day he presented to the emergency room yesterday and has continued to have occasional low-volume bright red blood per rectum and his abdominal pain has resolved and he is on an aspirin a day for a valve problem but no other nonsteroidals and he has no other complaints ? ?Past Medical History:  ?Diagnosis Date  ? AAA (abdominal aortic aneurysm) ([place])   ? Anemia   ? Arthritis   ? Asymptomatic bilateral carotid artery stenosis 08/2015  ? 1-39%   ? Cataracts, bilateral   ? 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  ? Coronary artery disease   ? Diverticulosis   ? Enlarged aorta ([facility])   ? Enlarged prostate   ? slightly  ? GERD (gastroesophageal reflux disease)   ? takes Pantoprazole daily as needed  ? Glaucoma   ? uses eye drops daily  ? Headache   ? History of colon polyps   ? benign  ? History of kidney stones   ? Hyperlipidemia   ? no on any meds  ? Hypertension   ? takes Amlodipine and Atenolol daily  ? OSA on CPAP   ? Vocal cord nodule   ? pt. states  it's a" growth on vocal cord"  ? ? ?Past Surgical History:  ?Procedure Laterality Date  ? AORTIC ARCH ANGIOGRAPHY N/A 04/16/2016  ? Procedure: Aortic Arch Angiography;  Surgeon: [name] Martinique, MD;  Location: [facility];  Service: Cardiovascular;  Laterality: N/A;  ? AORTIC VALVE REPLACEMENT N/A 09/15/2016  ? Procedure: AORTIC VALVE REPLACEMENT (AVR);  Surgeon:  Grace Isaac, MD;  Location: [facility];  Service: Open Heart Surgery;  Laterality: N/A;  Using 37m Edwards Perimount Magna Ease Aortic Bioprosthesis Valve  ? ASCENDING AORTIC ROOT REPLACEMENT N/A 09/15/2016  ? Procedure: ASCENDING AORTIC ROOT REPLACEMENT;  Surgeon: GGrace Isaac MD;  [facility]  Service: Open Heart Surgery;  Laterality: N/A;  Using 354mGelweave Valsalva Graft  ? CHOLECYSTECTOMY N/A 12/19/2020  ? Procedure: LAPAROSCOPIC CHOLECYSTECTOMY WITH POSSIBLE INTRAOPERATIVE CHOLANGIOGRAM;  Surgeon: WiGreer PickerelMD;  [facility] Service: General;  Laterality: N/A;  ? COLONOSCOPY    ? COLONOSCOPY Left 04/16/2019  ? Procedure: COLONOSCOPY;  Surgeon: OuArta SilenceMD;  [facility] Service: Endoscopy;  Laterality: Left;  ? COLONOSCOPY N/A 06/01/2020  ? Procedure: COLONOSCOPY;  Surgeon: KaRonnette JuniperMD;  [facility] Service: Gastroenterology;  Laterality: N/A;  ? COLONOSCOPY WITH ESOPHAGOGASTRODUODENOSCOPY (EGD)    ? ESOPHAGOGASTRODUODENOSCOPY N/A 05/25/2020  ? Procedure: ESOPHAGOGASTRODUODENOSCOPY (EGD);  Surgeon: ScWilford CornerMD;  Location: [facility] Service: Endoscopy;  Laterality: N/A;  ? ESOPHAGOGASTRODUODENOSCOPY (EGD) WITH PROPOFOL Left 04/16/2019  ? Procedure: ESOPHAGOGASTRODUODENOSCOPY (EGD) WITH PROPOFOL;  Surgeon: OuArta SilenceMD;  [facility];  Service: Endoscopy;  Laterality: Left;  ? FLEXIBLE SIGMOIDOSCOPY N/A 05/25/2020  ? Procedure: FLEXIBLE SIGMOIDOSCOPY;  Surgeon: ScWilford CornerMD;  [facility];  Service: Endoscopy;  Laterality: N/A;  ?  IR THORACENTESIS ASP PLEURAL SPACE W/IMG GUIDE  10/23/2016  ? MICROLARYNGOSCOPY Right 09/20/2017  ? Procedure: MICROLARYNGOSCOPY WITH  EXCISION OF VOCAL CORD LESION;  Surgeon: Leta Baptist, MD;  Location: [facility];  Service: ENT;  Laterality: Right;  ? MULTIPLE EXTRACTIONS WITH ALVEOLOPLASTY N/A 06/10/2016  ? Procedure: Extraction of tooth #'s 2,8,13,15, and 29  with  alveoloplasty, maxillary right and left buccal exostoses reductions, and gross debridement of remaining teeth.;  Surgeon: Lenn Cal, DDS;  Location: [facility];  Service: Oral Surgery;  Laterality: N/A;  ? RIGHT/LEFT HEART CATH AND CORONARY ANGIOGRAPHY N/A 04/16/2016  ? Procedure: Right/Left Heart Cath and Coronary Angiography;  Surgeon: [name] Martinique, MD;  Location: [facility];  Service: Cardiovascular;  Laterality: N/A;  ? TEE WITHOUT CARDIOVERSION N/A 09/15/2016  ? Procedure: TRANSESOPHAGEAL ECHOCARDIOGRAM (TEE);  Surgeon: Grace Isaac, MD;  Location: [facility];  Service: Open Heart Surgery;  Laterality: N/A;  ? ? ?Family History  ?Problem Relation Age of Onset  ? Heart disease Father   ? Heart attack Father   ? Thyroid disease Sister   ? ? ?Social History:  reports that he has quit smoking. His smoking use included cigarettes. He has a 25.00 pack-year smoking history. He has never used smokeless tobacco. He reports that he does not drink alcohol and does not use drugs. ? ?Allergies: No Known Allergies ? ?Medications: I have reviewed the patient's current medications. ? ?Results for orders placed or performed during the hospital encounter of 05/10/21 (from the past 48 hour(s))  ?Occult blood card to lab, stool     Status: Abnormal  ? Collection Time: 05/10/21 12:34 PM  ?Result Value Ref Range  ? Fecal Occult Bld POSITIVE (A) NEGATIVE  ?  Comment: Performed at [facility], [address] [place], [place] [address]  ?Comprehensive metabolic panel     Status: Abnormal  ? Collection Time: 05/10/21  1:05 PM  ?Result Value Ref Range  ? Sodium 137 135 - 145 mmol/L  ? Potassium 3.4 (L) 3.5 - 5.1 mmol/L  ? Chloride 102 98 - 111 mmol/L  ? CO2 25 22 - 32 mmol/L  ? Glucose, Bld 128 (H) 70 - 99 mg/dL  ?  Comment: Glucose reference range applies only to samples taken after fasting for at least 8 hours.  ? BUN 15 8 - 23 mg/dL  ? Creatinine, Ser 1.18 0.61 - 1.24 mg/dL  ? Calcium 9.8 8.9 - 10.3  mg/dL  ? Total Protein 7.1 6.5 - 8.1 g/dL  ? Albumin 4.5 3.5 - 5.0 g/dL  ? AST 18 15 - 41 U/L  ? ALT 10 0 - 44 U/L  ? Alkaline Phosphatase 39 38 - 126 U/L  ? Total Bilirubin 0.7 0.3 - 1.2 mg/dL  ? GFR, Estimated >60 >60 mL/min  ?  Comment: (NOTE) ?Calculated using the CKD-EPI Creatinine Equation (2021) ?  ? Anion gap 10 5 - 15  ?  Comment: Performed at [facility], [address], [place], [place] [address]  ?CBC with Differential     Status: Abnormal  ? Collection Time: 05/10/21  1:05 PM  ?Result Value Ref Range  ? WBC 6.6 4.0 - 10.5 K/uL  ? RBC 3.29 (L) 4.22 - 5.81 MIL/uL  ? Hemoglobin 10.1 (L) 13.0 - 17.0 g/dL  ? HCT 29.9 (L) 39.0 - 52.0 %  ? MCV 90.9 80.0 - 100.0 fL  ? MCH 30.7 26.0 - 34.0 pg  ? MCHC 33.8 30.0 -  36.0 g/dL  ? RDW 13.5 11.5 - 15.5 %  ? Platelets 133 (L) 150 - 400 K/uL  ?  Comment: Immature Platelet Fraction may be ?clinically indicated, consider ?ordering this additional test ?[id number] ?  ? nRBC 0.0 0.0 - 0.2 %  ? Neutrophils Relative % 72 %  ? Neutro Abs 4.8 1.7 - 7.7 K/uL  ? Lymphocytes Relative 16 %  ? Lymphs Abs 1.1 0.7 - 4.0 K/uL  ? Monocytes Relative 10 %  ? Monocytes Absolute 0.7 0.1 - 1.0 K/uL  ? Eosinophils Relative 1 %  ? Eosinophils Absolute 0.1 0.0 - 0.5 K/uL  ? Basophils Relative 0 %  ? Basophils Absolute 0.0 0.0 - 0.1 K/uL  ? Immature Granulocytes 1 %  ? Abs Immature Granulocytes 0.03 0.00 - 0.07 K/uL  ?  Comment: Performed at [facility], [address] [place], [place] [address]  ?Type and screen [place]     Status: None  ? Collection Time: 05/10/21  5:17 PM  ?Result Value Ref Range  ? ABO/RH(D) AB POS   ? Antibody Screen NEG   ? Sample Expiration    ?  05/13/2021,2359 ?Performed at [facility], [place] [address], [place], [place] [address] ?  ?CBC     Status: Abnormal  ? Collection Time: 05/11/21  5:31 AM  ?Result Value Ref Range  ? WBC 5.6 4.0 - 10.5 K/uL  ? RBC 2.88 (L) 4.22 - 5.81 MIL/uL  ?  Hemoglobin 8.9 (L) 13.0 - 17.0 g/dL  ? HCT 27.2 (L) 39.0 - 52.0 %  ? MCV 94.4 80.0 - 100.0 fL  ? MCH 30.9 26.0 - 34.0 pg  ? MCHC 32.7 30.0 - 36.0 g/dL  ? RDW 13.6 11.5 - 15.5 %  ? Platelets 127 (L) 150 - 400 K

## 2021-05-11 NOTE — Progress Notes (Signed)
Pt reports 3 bloody BM this RN visualized last BM bright red in toilet bowl liquid form. Informed on-call. ?

## 2021-05-11 NOTE — Progress Notes (Addendum)
One bloody episode today, no stool, just liquid blood from rectum. MD made aware.  ?

## 2021-05-11 NOTE — Progress Notes (Signed)
-   Received call from radiology department about CT Angio positive for active bleeding within mid sigmoid colon.  Called and discussed with interventional radiology Dr. Alfredia Ferguson.  According to him, patient has a large infrarenal abdominal aortic aneurysm and it would be difficult to access small bleeding lesion in the mid sigmoid colon. ? ?-Patient is currently hemodynamically stable. ? ?-Call interventional radiology if patient becomes unstable. ? ?-Meanwhile, monitor H&H.  Transfuse if hemoglobin less than 8. ? ?-GI will follow. ? ?-Discussed with hospitalist. ? ?Otis Brace MD, FACP ?05/11/2021, 7:58 PM ? ?Contact #  [phone]  ?

## 2021-05-11 NOTE — Progress Notes (Signed)
? ?PROGRESS NOTE ? ? ? ?Lonnie Hansen  HXT:[id number] DOB: Aug 27, 1950 DOA: 05/10/2021 ?PCP: [name], MD ? ? ?Brief Narrative: ?Lonnie Hansen is a 71 y.o. male with a history of AAA, AVR, chronic ITP, GERD, hyperlipidemia, OSA on CPAP, hypertension, GI bleeding. Patient reports black clots and red blood per rectum starting about two days prior with associated acute anemia concerning for active GI bleeding. GI consulted. ? ? ?Assessment and Plan: ?* Painless rectal bleeding ?Normal BUN. Unsure if lower or upper. History of diverticulosis, but recent history of dark bloody stools/clots. ?-Protonix IV ?-Clear liquid diet ?-GI recommendations: CTA abdomen/pelvis ? ?Acute blood loss anemia ?Secondary to GI bleeding. Baseline hemoglobin of about 12. Hemoglobin down to 8.9 today. ?-CBC in AM ?-H&H this afternoon ? ?Chronic ITP (idiopathic thrombocytopenia) ([facility]CC) ?Noted. Platelets currently 133 and stable. Patient follows with [name] as an outpatient. ? ?Diverticulosis ?Noted on history. Possibly contributing to current presentation. ? ?GERD (gastroesophageal reflux disease) ?-Continue Protonix as mentioned in problem, Painless rectal bleeding ? ?Hypertension ?Patient is on amlodipine and atenolol as an outpatient. Blood pressure stable. ?-Continue atenolol and hold amlodipine ? ?Hyperlipidemia ?-Continue Crestor ? ?OSA on CPAP ?-CPAP qhs ? ? ? ?DVT prophylaxis: SCDs ?Code Status:   Code Status: Full Code ?Family Communication: None at bedside ?Disposition Plan: Discharge home pending GI recommendations/management ? ? ?Consultants:  ?[facility] ? ?Procedures:  ?None ? ?Antimicrobials: ?None  ? ? ?Subjective: ?Patient reports multiple episodes of hematochezia. No melena noted. ? ?Objective: ?BP 120/82 (BP Location: Right Arm)   Pulse 65   Temp 98.7 ?F (37.1 ?C) (Oral)   Resp 16   Ht '5\' 6"'$  (1.676 m)   Wt 90.3 kg   SpO2 99%   BMI 32.12 kg/m?  ? ?Examination: ? ?General exam: Appears calm and  comfortable ?Respiratory system: Clear to auscultation. Respiratory effort normal. ?Cardiovascular system: S1 & S2 heard, RRR. No murmurs, rubs, gallops or clicks. ?Gastrointestinal system: Abdomen is nondistended, soft and nontender. Normal bowel sounds heard. ?Central nervous system: Alert and oriented. No focal neurological deficits. ?Musculoskeletal: No edema. No calf tenderness ?Skin: No cyanosis. No rashes ?Psychiatry: Judgement and insight appear normal. Mood & affect appropriate.  ? ? ?Data Reviewed: I have personally reviewed following labs and imaging studies ? ?CBC ?Lab Results  ?Component Value Date  ? WBC 5.6 05/11/2021  ? RBC 2.88 (L) 05/11/2021  ? HGB 8.9 (L) 05/11/2021  ? HCT 27.2 (L) 05/11/2021  ? MCV 94.4 05/11/2021  ? MCH 30.9 05/11/2021  ? PLT 127 (L) 05/11/2021  ? MCHC 32.7 05/11/2021  ? RDW 13.6 05/11/2021  ? LYMPHSABS 1.1 05/10/2021  ? MONOABS 0.7 05/10/2021  ? EOSABS 0.1 05/10/2021  ? BASOSABS 0.0 05/10/2021  ? ? ? ?Last metabolic panel ?Lab Results  ?Component Value Date  ? NA 141 05/11/2021  ? K 3.6 05/11/2021  ? CL 107 05/11/2021  ? CO2 26 05/11/2021  ? BUN 16 05/11/2021  ? CREATININE 1.15 05/11/2021  ? GLUCOSE 107 (H) 05/11/2021  ? GFRNONAA >60 05/11/2021  ? GFRAA 82 05/11/2019  ? CALCIUM 9.1 05/11/2021  ? PHOS 3.1 05/25/2020  ? PROT 7.1 05/10/2021  ? ALBUMIN 4.5 05/10/2021  ? LABGLOB 2.6 08/17/2018  ? AGRATIO 1.8 08/17/2018  ? BILITOT 0.7 05/10/2021  ? ALKPHOS 39 05/10/2021  ? AST 18 05/10/2021  ? ALT 10 05/10/2021  ? ANIONGAP 8 05/11/2021  ? ? ?GFR: ?Estimated Creatinine Clearance: 62 mL/min (by C-G formula based on SCr of 1.15  mg/dL). ? ?No results found for this or any previous visit (from the past 240 hour(s)).  ? ? ?Radiology Studies: ?No results found. ? ? ? LOS: 0 days  ? ? ?Cordelia Poche, MD ?[facility] ?05/11/2021, 1:46 PM ? ? ?If 7PM-7AM, please contact night-coverage ?[email or web address] ? ?

## 2021-05-12 DIAGNOSIS — D62 Acute posthemorrhagic anemia: Secondary | ICD-10-CM | POA: Diagnosis not present

## 2021-05-12 DIAGNOSIS — K625 Hemorrhage of anus and rectum: Secondary | ICD-10-CM | POA: Diagnosis not present

## 2021-05-12 DIAGNOSIS — D693 Immune thrombocytopenic purpura: Secondary | ICD-10-CM | POA: Diagnosis not present

## 2021-05-12 DIAGNOSIS — K5790 Diverticulosis of intestine, part unspecified, without perforation or abscess without bleeding: Secondary | ICD-10-CM | POA: Diagnosis not present

## 2021-05-12 LAB — CBC
HCT: 23.6 % — ABNORMAL LOW (ref 39.0–52.0)
Hemoglobin: 7.7 g/dL — ABNORMAL LOW (ref 13.0–17.0)
MCH: 30.7 pg (ref 26.0–34.0)
MCHC: 32.6 g/dL (ref 30.0–36.0)
MCV: 94 fL (ref 80.0–100.0)
Platelets: 119 10*3/uL — ABNORMAL LOW (ref 150–400)
RBC: 2.51 MIL/uL — ABNORMAL LOW (ref 4.22–5.81)
RDW: 13.7 % (ref 11.5–15.5)
WBC: 6 10*3/uL (ref 4.0–10.5)
nRBC: 0 % (ref 0.0–0.2)

## 2021-05-12 LAB — BASIC METABOLIC PANEL
Anion gap: 6 (ref 5–15)
BUN: 15 mg/dL (ref 8–23)
CO2: 27 mmol/L (ref 22–32)
Calcium: 8.8 mg/dL — ABNORMAL LOW (ref 8.9–10.3)
Chloride: 105 mmol/L (ref 98–111)
Creatinine, Ser: 1.25 mg/dL — ABNORMAL HIGH (ref 0.61–1.24)
GFR, Estimated: >60 mL/min (ref >60)
Glucose, Bld: 110 mg/dL — ABNORMAL HIGH (ref 70–99)
Potassium: 3.7 mmol/L (ref 3.5–5.1)
Sodium: 138 mmol/L (ref 135–145)

## 2021-05-12 LAB — HEMOGLOBIN AND HEMATOCRIT, BLOOD
HCT: 26.7 % — ABNORMAL LOW (ref 39.0–52.0)
Hemoglobin: 9.3 g/dL — ABNORMAL LOW (ref 13.0–17.0)

## 2021-05-12 LAB — PREPARE RBC (CROSSMATCH)

## 2021-05-12 MED ORDER — SODIUM CHLORIDE 0.9% IV SOLUTION: Freq: Once | INTRAVENOUS | Status: AC

## 2021-05-12 NOTE — Consult Note (Signed)
[facility] CM Inpatient Consult ? ? ?05/12/2021 ? ?[name] ?09-01-1950 ?[id number] ? ?[facility] CM) ?  ?Patient is currently active with [facility] CM) for chronic disease management services.  Patient has been engaged by a [facility] ? ?Plan: Will to follow for progression and disposition plans. ? ?Of note, [facility] Care Management services does not replace or interfere with any services that are arranged by inpatient case management or social work.  ? ?Netta Cedars, MSN, RN ?[facility] Liaison ?Phone [phone] ?Toll free office [phone]  ?

## 2021-05-12 NOTE — Progress Notes (Signed)
Patient refused CPAP for tonight. Patient stated he would bring his from home if he needs to stay longer ?

## 2021-05-12 NOTE — Consult Note (Signed)
? ? ? ?[name] ?Mar 13, 1950  ?[id number].   ? ?Requesting MD: Dr. Cordelia Poche ?Chief Complaint/Reason for Consult: Lower GI Bleed ? ?HPI: Lonnie Hansen is 71 y.o. male with known diverticular disease who presented to the ED for on 4/15 for BRB per rectum.  Patient reports he started having some lower abdominal cramps and BRB per rectum on Thursday.  He presented to the ED but left without being seen.  His symptoms persisted leading to return ED visit on 4/15.  He was admitted for lower GI bleed and anemia. GI has seen the patient but has not performed any endoscopic evaluation this admission for clipping or intervention etc.  A CT angio was ordered which revealed chronic diverticular disease with small focus of active bleeding within the left lateral wall of the mid sigmoid colon.  He also has a large infrarenal aneurysm and there was initially some concern of embolization with this finding.  The patient is getting transfused 1 unit of pRBCs today for a hgb of 7.7, but he has stopped bleeding and has not had BRBPR since yesterday.  We have been asked to see the patient to consider colectomy. ? ?ROS: ?ROS: Please see HPI, otherwise all other systems have been reviewed and are negative. ? ?Family History  ?Problem Relation Age of Onset  ? Heart disease Father   ? Heart attack Father   ? Thyroid disease Sister   ? ? ?Past Medical History:  ?Diagnosis Date  ? AAA (abdominal aortic aneurysm) ([place])   ? Anemia   ? Arthritis   ? Asymptomatic bilateral carotid artery stenosis 08/2015  ? 1-39%   ? Cataracts, bilateral   ? 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  ? Coronary artery disease   ? Diverticulosis   ? Enlarged aorta ([facility])   ? Enlarged prostate   ? slightly  ? GERD (gastroesophageal reflux disease)   ? takes Pantoprazole daily as needed  ? Glaucoma   ? uses eye drops daily  ? Headache   ? History of colon polyps   ? benign  ? History of kidney stones   ? Hyperlipidemia   ? no on any meds  ?  Hypertension   ? takes Amlodipine and Atenolol daily  ? OSA on CPAP   ? Vocal cord nodule   ? pt. states  it's a" growth on vocal cord"  ? ? ?Past Surgical History:  ?Procedure Laterality Date  ? AORTIC ARCH ANGIOGRAPHY N/A 04/16/2016  ? Procedure: Aortic Arch Angiography;  Surgeon: [name] Martinique, MD;  Location: [place] [facility];  Service: Cardiovascular;  Laterality: N/A;  ? AORTIC VALVE REPLACEMENT N/A 09/15/2016  ? Procedure: AORTIC VALVE REPLACEMENT (AVR);  Surgeon: Grace Isaac, MD;  Location: [place];  Service: Open Heart Surgery;  Laterality: N/A;  Using 47m Edwards Perimount Magna Ease Aortic Bioprosthesis Valve  ? ASCENDING AORTIC ROOT REPLACEMENT N/A 09/15/2016  ? Procedure: ASCENDING AORTIC ROOT REPLACEMENT;  Surgeon: GGrace Isaac MD;  [place]  Service: Open Heart Surgery;  Laterality: N/A;  Using 348mGelweave Valsalva Graft  ? CHOLECYSTECTOMY N/A 12/19/2020  ? Procedure: LAPAROSCOPIC CHOLECYSTECTOMY WITH POSSIBLE INTRAOPERATIVE CHOLANGIOGRAM;  Surgeon: WiGreer PickerelMD;  [place] [facility] Service: General;  Laterality: N/A;  ? COLONOSCOPY    ? COLONOSCOPY Left 04/16/2019  ? Procedure: COLONOSCOPY;  Surgeon: OuArta SilenceMD;  [place] [facility] Service: Endoscopy;  Laterality: Left;  ? COLONOSCOPY N/A 06/01/2020  ? Procedure: COLONOSCOPY;  Surgeon:  Ronnette Juniper, MD;  [facility];  Service: Gastroenterology;  Laterality: N/A;  ? COLONOSCOPY WITH ESOPHAGOGASTRODUODENOSCOPY (EGD)    ? ESOPHAGOGASTRODUODENOSCOPY N/A 05/25/2020  ? Procedure: ESOPHAGOGASTRODUODENOSCOPY (EGD);  Surgeon: Wilford Corner, MD;  Location: [place];  Service: Endoscopy;  Laterality: N/A;  ? ESOPHAGOGASTRODUODENOSCOPY (EGD) WITH PROPOFOL Left 04/16/2019  ? Procedure: ESOPHAGOGASTRODUODENOSCOPY (EGD) WITH PROPOFOL;  Surgeon: Arta Silence, MD;  Location: [facility] ENDOSCOPY;  Service: Endoscopy;  Laterality: Left;  ? FLEXIBLE SIGMOIDOSCOPY N/A 05/25/2020  ? Procedure: FLEXIBLE SIGMOIDOSCOPY;   Surgeon: Wilford Corner, MD;  Location: [facility] ENDOSCOPY;  Service: Endoscopy;  Laterality: N/A;  ? IR THORACENTESIS ASP PLEURAL SPACE W/IMG GUIDE  10/23/2016  ? MICROLARYNGOSCOPY Right 09/20/2017  ? Procedure: MICROLARYNGOSCOPY WITH  EXCISION OF VOCAL CORD LESION;  Surgeon: Leta Baptist, MD;  Location: [place];  Service: ENT;  Laterality: Right;  ? MULTIPLE EXTRACTIONS WITH ALVEOLOPLASTY N/A 06/10/2016  ? Procedure: Extraction of tooth #'s 2,8,13,15, and 29  with alveoloplasty, maxillary right and left buccal exostoses reductions, and gross debridement of remaining teeth.;  Surgeon: Lenn Cal, DDS;  Location: [place];  Service: Oral Surgery;  Laterality: N/A;  ? RIGHT/LEFT HEART CATH AND CORONARY ANGIOGRAPHY N/A 04/16/2016  ? Procedure: Right/Left Heart Cath and Coronary Angiography;  Surgeon: [name] Martinique, MD;  Location: [place] [facility];  Service: Cardiovascular;  Laterality: N/A;  ? TEE WITHOUT CARDIOVERSION N/A 09/15/2016  ? Procedure: TRANSESOPHAGEAL ECHOCARDIOGRAM (TEE);  Surgeon: Grace Isaac, MD;  Location: [place];  Service: Open Heart Surgery;  Laterality: N/A;  ? ? ?Social History:  reports that he has quit smoking. His smoking use included cigarettes. He has a 25.00 pack-year smoking history. He has never used smokeless tobacco. He reports that he does not drink alcohol and does not use drugs. ? ?Allergies: No Known Allergies ? ?Medications Prior to Admission  ?Medication Sig Dispense Refill  ? acetaminophen (TYLENOL) 325 MG tablet Take 325 mg by mouth every 6 (six) hours as needed for moderate pain.    ? acetaminophen (TYLENOL) 500 MG tablet Take 500-1,000 mg by mouth in the morning and at bedtime. Take 1 tablet in the morning and Take 2 tablets in the evening    ? amLODipine (NORVASC) 10 MG tablet Take 10 mg by mouth every evening.    ? ANUCORT-HC 25 MG suppository Place 1 suppository rectally daily as needed for pain.    ? atenolol (TENORMIN) 25 MG tablet Take 12.5 mg by mouth  2 (two) times daily. 180 tablet 3  ? ferrous sulfate 325 (65 FE) MG tablet Take 325 mg by mouth daily.    ? latanoprost (XALATAN) 0.005 % ophthalmic solution Place 1 drop into both eyes 2 (two) times daily.    ? Multiple Vitamin (MULTIVITAMIN WITH MINERALS) TABS tablet Take 1 tablet by mouth daily at 12 noon.    ? pantoprazole (PROTONIX) 40 MG tablet Take 40 mg by mouth 2 (two) times daily.    ? polyethylene glycol (MIRALAX / GLYCOLAX) 17 g packet Take 17 g by mouth daily. (Patient taking differently: Take 17 g by mouth daily as needed for moderate constipation.) 14 each 0  ? rosuvastatin (CRESTOR) 10 MG tablet Take 1 tablet by mouth once daily (Patient taking differently: Take 10 mg by mouth every evening.) 90 tablet 1  ? triamcinolone cream (KENALOG) 0.1 % Apply 1 application topically 2 (two) times daily as needed (facial itching/rash).    ? acetaminophen (TYLENOL) 325 MG tablet Take 2 tablets (650  mg total) by mouth every 6 (six) hours as needed for mild pain or fever. (Patient not taking: Reported on 05/10/2021)    ? psyllium (HYDROCIL/METAMUCIL) 95 % PACK Take 1 packet by mouth daily. (Patient not taking: Reported on 05/10/2021) 240 each 3  ? traMADol (ULTRAM) 50 MG tablet Take 1 tablet (50 mg total) by mouth every 6 (six) hours as needed. (Patient not taking: Reported on 05/10/2021) 15 tablet 0  ? ? ? ?Physical Exam: ?Blood pressure (!) (P) 145/96, pulse (P) 63, temperature (P) 98.2 ?F (36.8 ?C), temperature source (P) Oral, resp. rate (P) 18, height '5\' 6"'$  (1.676 m), weight 90.3 kg, SpO2 (P) 99 %. ?General: pleasant, WD/WN black male who is laying in bed in NAD ?HEENT: head is normocephalic, atraumatic.  Sclera are noninjected.  PERRL.  Ears and nose without any masses or lesions.  Mouth is pink and moist. Dentition with some missing teeth ?Heart: regular, rate, and rhythm.  Normal s1,s2. No obvious murmurs, gallops, or rubs noted, but + click from his AV.  Palpable pedal pulses bilaterally  ?Lungs: CTAB, no  wheezes, rhonchi, or rales noted.  Respiratory effort nonlabored ?Abd: Soft, NT/ND, +BS, no masses, hernias, or organomegaly ?MS: no BUE/BLE edema, calves soft and nontender ?Skin: warm and dry with no masses, le

## 2021-05-12 NOTE — Progress Notes (Signed)
[facility] Progress Note ? ?Lonnie Hansen 71 y.o. Jan 14, 1951 ? ?CC:  Rectal bleeding ? ? ?Subjective: ?Patient states he has had 3-4 episodes of rectal bleeding since yesterday. Each time he goes to the bathroom he notices about 1 cup of bright red blood per rectum. Denies abdominal pain, nausea, or vomiting. ? ?ROS : Review of Systems  ?Gastrointestinal:  Positive for blood in stool. Negative for abdominal pain, constipation, diarrhea, heartburn, melena, nausea and vomiting.  ?Genitourinary:  Negative for dysuria and urgency.   ? ? ?Objective: ?Vital signs in last 24 hours: ?Vitals:  ? 05/12/21 0916 05/12/21 0932  ?BP: (!) 144/88 121/79  ?Pulse: 71 70  ?Resp: 18 16  ?Temp: 97.8 ?F (36.6 ?C) 98.4 ?F (36.9 ?C)  ?SpO2: 99% 99%  ? ? ?Physical Exam: ? ?General:  Alert, cooperative, no distress, appears stated age  ?Head:  Normocephalic, without obvious abnormality, atraumatic  ?Eyes:  Anicteric sclera, EOM's intact, conjunctival pallor  ?Lungs:   Clear to auscultation bilaterally, respirations unlabored  ?Heart:  Regular rate and rhythm, S1, S2 normal  ?Abdomen:   Soft, non-tender, bowel sounds active all four quadrants,  no masses,   ? ? ?Lab Results: ?Recent Labs  ?  05/11/21 ?2060 05/12/21 ?1561  ?NA 141 138  ?K 3.6 3.7  ?CL 107 105  ?CO2 26 27  ?GLUCOSE 107* 110*  ?BUN 16 15  ?CREATININE 1.15 1.25*  ?CALCIUM 9.1 8.8*  ? ?Recent Labs  ?  05/09/21 ?1309 05/10/21 ?1305  ?AST 19 18  ?ALT 12 10  ?ALKPHOS 45 39  ?BILITOT 0.7 0.7  ?PROT 7.0 7.1  ?ALBUMIN 4.4 4.5  ? ?Recent Labs  ?  05/09/21 ?1309 05/09/21 ?1309 05/10/21 ?1305 05/11/21 ?5379 05/11/21 ?1436 05/12/21 ?4327  ?WBC 7.4   < > 6.6 5.6  --  6.0  ?NEUTROABS 4.9  --  4.8  --   --   --   ?HGB 11.1*   < > 10.1* 8.9* 8.7* 7.7*  ?HCT 33.2*  --  29.9* 27.2* 26.8* 23.6*  ?MCV 93.0  --  90.9 94.4  --  94.0  ?PLT 123*   < > 133* 127*  --  119*  ? < > = values in this interval not displayed.  ? ?No results for input(s): LABPROT, INR in the last 72  hours. ? ? ? ?Assessment ?Hematochezia ?-CT angio: Positive for active bleeding within the mid sigmoid colon.  Patient has a large infrarenal abdominal aortic aneurysm that would be difficult to access small bleeding lesion in the mid sigmoid colon ?-Hgb 7.7, normal MCV ?-BUN 15, creatinine 1.25 ? ? ?Plan: ?Patient continuing to have rectal bleeding and anemia. Will continue to monitor. If patient becomes unstable, please call interventional radiology ?Continue daily CBC and transfuse as needed to maintain HGB > 7  ?[facility] will follow ? ?Lun Muro Radford Pax PA-C ?05/12/2021, 9:56 AM ? ?Contact #  [phone]  ?

## 2021-05-12 NOTE — Progress Notes (Signed)
Patient continues to decline nocturnal CPAP in the absence of having his home machine with him. RT will continue to follow ?

## 2021-05-12 NOTE — Progress Notes (Signed)
? ?PROGRESS NOTE ? ? ? ?Lonnie Hansen  MWN:[id number] DOB: December 13, 1950 DOA: 05/10/2021 ?PCP: [name], MD ? ? ?Brief Narrative: ?Lonnie Hansen is a 71 y.o. male with a history of AAA, AVR, chronic ITP, GERD, hyperlipidemia, OSA on CPAP, hypertension, GI bleeding. Patient reports black clots and red blood per rectum starting about two days prior with associated acute anemia concerning for active GI bleeding. GI consulted. CTA abdomen/pelvis significant for sigmoid colon bleed. GI recommending general surgery consult for consideration of left hemicolectomy. ? ? ?Assessment and Plan: ?* Diverticulosis of intestine with bleeding ?Normal BUN. Unsure if lower or upper. History of diverticulosis, but recent history of dark bloody stools/clots. ?-Protonix IV ?-Clear liquid diet ?-GI recommendations: CTA abdomen/pelvis ? ?Acute blood loss anemia ?Secondary to GI bleeding. Baseline hemoglobin of about 12. Hemoglobin down to 7.7 today. GI recommending transfusion for hemoglobin < 8.  ?-Transfuse 1 unit of PRBC ?-CBC in AM ?-H&H this afternoon ? ?Chronic ITP (idiopathic thrombocytopenia) ([facility]CC) ?Noted. Platelets of 133 on admission and drifting down slightly. Patient follows with [name] as an outpatient. ? ?Diverticulosis ?Noted on history. Possibly contributing to current presentation. ? ?GERD (gastroesophageal reflux disease) ?-Continue Protonix ? ?Hypertension ?Patient is on amlodipine and atenolol as an outpatient. Blood pressure stable. ?-Continue atenolol and hold amlodipine ? ?Hyperlipidemia ?-Continue Crestor ? ?OSA on CPAP ?-CPAP qhs ? ? ? ?DVT prophylaxis: SCDs ?Code Status:   Code Status: Full Code ?Family Communication: Wife at bedside ?Disposition Plan: Discharge home pending GI/general surgery recommendations/management ? ? ?Consultants:  ?[facility] ?General surgery ? ?Procedures:  ?None ? ?Antimicrobials: ?None  ? ? ?Subjective: ?Continuing to have hematochezia ? ?Objective: ?BP (!) (P) 145/96    Pulse (P) 63   Temp (P) 98.2 ?F (36.8 ?C) (Oral)   Resp (P) 18   Ht '5\' 6"'$  (1.676 m)   Wt 90.3 kg   SpO2 (P) 99%   BMI 32.12 kg/m?  ? ?Examination: ? ?General exam: Appears calm and comfortable ?Respiratory system: Clear to auscultation. Respiratory effort normal. ?Cardiovascular system: S1 & S2 heard, RRR. ?Gastrointestinal system: Abdomen is nondistended, soft and nontender. Normal bowel sounds heard. ?Central nervous system: Alert and oriented. No focal neurological deficits. ?Musculoskeletal: No edema. No calf tenderness ?Skin: No cyanosis. No rashes ?Psychiatry: Judgement and insight appear normal. Mood & affect appropriate.  ? ? ?Data Reviewed: I have personally reviewed following labs and imaging studies ? ?CBC ?Lab Results  ?Component Value Date  ? WBC 6.0 05/12/2021  ? RBC 2.51 (L) 05/12/2021  ? HGB 7.7 (L) 05/12/2021  ? HCT 23.6 (L) 05/12/2021  ? MCV 94.0 05/12/2021  ? MCH 30.7 05/12/2021  ? PLT 119 (L) 05/12/2021  ? MCHC 32.6 05/12/2021  ? RDW 13.7 05/12/2021  ? LYMPHSABS 1.1 05/10/2021  ? MONOABS 0.7 05/10/2021  ? EOSABS 0.1 05/10/2021  ? BASOSABS 0.0 05/10/2021  ? ? ? ?Last metabolic panel ?Lab Results  ?Component Value Date  ? NA 138 05/12/2021  ? K 3.7 05/12/2021  ? CL 105 05/12/2021  ? CO2 27 05/12/2021  ? BUN 15 05/12/2021  ? CREATININE 1.25 (H) 05/12/2021  ? GLUCOSE 110 (H) 05/12/2021  ? GFRNONAA >60 05/12/2021  ? GFRAA 82 05/11/2019  ? CALCIUM 8.8 (L) 05/12/2021  ? PHOS 3.1 05/25/2020  ? PROT 7.1 05/10/2021  ? ALBUMIN 4.5 05/10/2021  ? LABGLOB 2.6 08/17/2018  ? AGRATIO 1.8 08/17/2018  ? BILITOT 0.7 05/10/2021  ? ALKPHOS 39 05/10/2021  ? AST 18 05/10/2021  ?  ALT 10 05/10/2021  ? ANIONGAP 6 05/12/2021  ? ? ?GFR: ?Estimated Creatinine Clearance: 57 mL/min (A) (by C-G formula based on SCr of 1.25 mg/dL (H)). ? ?No results found for this or any previous visit (from the past 240 hour(s)).  ? ? ?Radiology Studies: ?CT Angio Abd/Pel w/ and/or w/o ? ?Result Date: 05/11/2021 ?CLINICAL DATA:  Evaluate  for lower GI bleed. Patient reports black clots and red blood per rectum which began 2 days prior. Anemia. EXAM: CTA ABDOMEN AND PELVIS WITHOUT AND WITH CONTRAST TECHNIQUE: Multidetector CT imaging of the abdomen and pelvis was performed using the standard protocol during bolus administration of intravenous contrast. Multiplanar reconstructed images and MIPs were obtained and reviewed to evaluate the vascular anatomy. RADIATION DOSE REDUCTION: This exam was performed according to the departmental dose-optimization program which includes automated exposure control, adjustment of the mA and/or kV according to patient size and/or use of iterative reconstruction technique. CONTRAST:  153m OMNIPAQUE IOHEXOL 350 MG/ML SOLN COMPARISON:  05/31/2020 FINDINGS: VASCULAR Aorta: Aneurysmal dilatation scratch set there is fusiform aneurysmal dilatation of the infrarenal abdominal aorta with maximum diameter of 4.9 cm, image 102/5. Formally this had a maximum diameter 4.8 cm. Wall adherent mural thrombus identified within the aneurysmal segment of the abdominal aorta. Celiac: Patent without evidence of aneurysm, dissection, vasculitis or significant stenosis. SMA: Patent without evidence of aneurysm, dissection, vasculitis or significant stenosis. Renals: Both renal arteries are patent without evidence of aneurysm, dissection, vasculitis, fibromuscular dysplasia or significant stenosis. IMA: Patent without evidence of aneurysm, dissection, vasculitis or significant stenosis. Inflow: Patent without evidence of aneurysm, dissection, vasculitis or significant stenosis. Proximal Outflow: Bilateral common femoral and visualized portions of the superficial and profunda femoral arteries are patent without evidence of aneurysm, dissection, vasculitis or significant stenosis. Veins: No obvious venous abnormality within the limitations of this arterial phase study. Review of the MIP images confirms the above findings. NON-VASCULAR Lower  chest: No acute abnormality. Hepatobiliary: 7 mm low-density structure within dome of left hepatic lobe is unchanged and remains too small to characterize. No suspicious liver abnormality. Status post cholecystectomy. No bile duct dilatation. Pancreas: Unremarkable. No pancreatic ductal dilatation or surrounding inflammatory changes. Spleen: Normal in size without focal abnormality. Adrenals/Urinary Tract: Adrenal glands appear normal. Unchanged appearance of bilateral Bosniak class 1 cysts. The largest is in the right renal sinus measuring 4.5 cm. No follow-up recommended. Calcification is identified within the upper pole of the left kidney measuring 1.3 cm. No hydronephrosis identified bilaterally. Small diverticulum arises off the anterior dome of the bladder Stomach/Bowel: Stomach appears normal. The appendix is visualized and is within normal limits. There is no bowel wall thickening, inflammation, or distension identified. Extensive sigmoid diverticulosis identified. Wall thickening without surrounding inflammation is noted involving the sigmoid colon likely reflecting sequelae of chronic diverticular disease with circular muscle hypertrophy. There is a small focus of intraluminal contrast within the left lateral wall of the mid sigmoid colon, image 15/5. There is mild progressive accumulation intraluminal contrast noted on the delayed series, image 63/3, compatible with active bleeding. Lymphatic: No significant vascular findings are present. No enlarged abdominal or pelvic lymph nodes. Reproductive: Prostate gland enlargement noted. Other: No free fluid or fluid collections within the abdomen or pelvis. No signs of pneumoperitoneum. Musculoskeletal: Advanced degenerative changes are identified involving the right hip joint. Multilevel degenerative disc disease noted throughout the lumbar spine. No acute or suspicious osseous findings. IMPRESSION: Vascular: 1. There is fusiform aneurysmal dilatation of the  infrarenal abdominal aorta with maximum  diameter of 4.9 cm. Formally this had a maximum diameter of 4.8 cm. Recommend follow-up CT/MR every 6 months and vascular consultation. This recommendation follows ACR consensus

## 2021-05-13 DIAGNOSIS — D62 Acute posthemorrhagic anemia: Secondary | ICD-10-CM | POA: Diagnosis not present

## 2021-05-13 DIAGNOSIS — K5790 Diverticulosis of intestine, part unspecified, without perforation or abscess without bleeding: Secondary | ICD-10-CM | POA: Diagnosis not present

## 2021-05-13 DIAGNOSIS — K625 Hemorrhage of anus and rectum: Secondary | ICD-10-CM | POA: Diagnosis not present

## 2021-05-13 DIAGNOSIS — D693 Immune thrombocytopenic purpura: Secondary | ICD-10-CM | POA: Diagnosis not present

## 2021-05-13 LAB — TYPE AND SCREEN
ABO/RH(D): AB POS
Antibody Screen: NEGATIVE
Unit division: 0

## 2021-05-13 LAB — HEMOGLOBIN AND HEMATOCRIT, BLOOD
HCT: 26.7 % — ABNORMAL LOW (ref 39.0–52.0)
Hemoglobin: 8.6 g/dL — ABNORMAL LOW (ref 13.0–17.0)

## 2021-05-13 LAB — CBC
HCT: 27.1 % — ABNORMAL LOW (ref 39.0–52.0)
Hemoglobin: 9 g/dL — ABNORMAL LOW (ref 13.0–17.0)
MCH: 30.7 pg (ref 26.0–34.0)
MCHC: 33.2 g/dL (ref 30.0–36.0)
MCV: 92.5 fL (ref 80.0–100.0)
Platelets: 117 10*3/uL — ABNORMAL LOW (ref 150–400)
RBC: 2.93 MIL/uL — ABNORMAL LOW (ref 4.22–5.81)
RDW: 15.3 % (ref 11.5–15.5)
WBC: 6.5 10*3/uL (ref 4.0–10.5)
nRBC: 0 % (ref 0.0–0.2)

## 2021-05-13 LAB — BPAM RBC
Blood Product Expiration Date: 202305082359
ISSUE DATE / TIME: 202304170910
Unit Type and Rh: 6200

## 2021-05-13 MED ORDER — PEG 3350-KCL-NA BICARB-NACL 420 G PO SOLR
4000.0000 mL | Freq: Once | ORAL | Status: AC
Start: 2021-05-13 — End: 2021-05-13
  Administered 2021-05-13: 4000 mL via ORAL

## 2021-05-13 NOTE — H&P (View-Only) (Signed)
[facility] Progress Note ? ?Lonnie Hansen 71 y.o. [birth year] ? ?CC:  Lower GI bleed, diverticular in origin ? ? ?Subjective: ?Patient seen laying comfortably in bed and examined.  Patient notes he had 2 bowel movements in the last 24 hours.  Notes the bowel movements are more brown at this time there is small amount of residual dark red blood.  He denies bright red blood or large volume of blood. ? ?Surgery was consulted, would like to avoid surgical intervention given patient is high risk given multiple medical problems and multiple aneurysms within his abdomen.  They recommend IR consult for embolization.  ? ?ROS : Review of Systems  ?Gastrointestinal:  Positive for blood in stool. Negative for abdominal pain, constipation, diarrhea, heartburn, melena, nausea and vomiting.  ?Genitourinary:  Negative for dysuria and hematuria.  ?Neurological:  Negative for dizziness and headaches.  ? ? ? ?Objective: ?Vital signs in last 24 hours: ?Vitals:  ? 05/12/21 2121 05/13/21 0514  ?BP: 117/76 120/89  ?Pulse: 62 73  ?Resp: 18 18  ?Temp: 98.7 ?F (37.1 ?C) 98.6 ?F (37 ?C)  ?SpO2: 98% 99%  ? ? ?Physical Exam: ? ?General:  Alert, cooperative, no distress, appears stated age  ?Head:  Normocephalic, without obvious abnormality, atraumatic  ?Eyes:  Anicteric sclera, EOM's intact  ?Lungs:   Clear to auscultation bilaterally, respirations unlabored  ?Heart:  Regular rate and rhythm, S1, S2 normal  ?Abdomen:   Soft, non-tender, bowel sounds active all four quadrants,  no masses,   ?Extremities: Extremities normal, atraumatic, no  edema  ?Pulses: 2+ and symmetric  ? ? ?Lab Results: ?Recent Labs  ?  05/11/21 ?2979 05/12/21 ?8921  ?NA 141 138  ?K 3.6 3.7  ?CL 107 105  ?CO2 26 27  ?GLUCOSE 107* 110*  ?BUN 16 15  ?CREATININE 1.15 1.25*  ?CALCIUM 9.1 8.8*  ? ?Recent Labs  ?  05/10/21 ?1305  ?AST 18  ?ALT 10  ?ALKPHOS 39  ?BILITOT 0.7  ?PROT 7.1  ?ALBUMIN 4.5  ? ?Recent Labs  ?  05/10/21 ?1305 05/11/21 ?1941 05/12/21 ?7408  05/12/21 ?1623 05/13/21 ?0505  ?WBC 6.6   < > 6.0  --  6.5  ?NEUTROABS 4.8  --   --   --   --   ?HGB 10.1*   < > 7.7* 9.3* 9.0*  ?HCT 29.9*   < > 23.6* 26.7* 27.1*  ?MCV 90.9   < > 94.0  --  92.5  ?PLT 133*   < > 119*  --  117*  ? < > = values in this interval not displayed.  ? ?No results for input(s): LABPROT, INR in the last 72 hours. ? ? ? ?Assessment ?Hematochezia ?-CT angio: Positive for active bleeding within the mid sigmoid colon.  Patient has a large infrarenal abdominal aortic aneurysm that would be difficult to access small bleeding lesion in the mid sigmoid colon ?-Hgb 9.3(7.7), received 1 unit of pRBC 4/17, normal MCV ?-BUN 15, creatinine 1.25 ? ? ?Plan: ?Patient with improving rectal bleeding and anemia. Will continue to monitor. If patient becomes unstable, please call interventional radiology ?Continue daily CBC and transfuse as needed to maintain HGB > 7  ?Advance to full liquid diet.  ?[facility] will follow ? ?Charlott Rakes PA-C ?05/13/2021, 9:51 AM ? ?Contact #  [phone]  ?

## 2021-05-13 NOTE — Progress Notes (Signed)
Pt refused cpap tonight.  Pt was advised that RT is available all night should he change his mind. 

## 2021-05-13 NOTE — Progress Notes (Signed)
[facility] Progress Note ? ?Lonnie Hansen 71 y.o. [birth year] ? ?CC:  Lower GI bleed, diverticular in origin ? ? ?Subjective: ?Patient seen laying comfortably in bed and examined.  Patient notes he had 2 bowel movements in the last 24 hours.  Notes the bowel movements are more brown at this time there is small amount of residual dark red blood.  He denies bright red blood or large volume of blood. ? ?Surgery was consulted, would like to avoid surgical intervention given patient is high risk given multiple medical problems and multiple aneurysms within his abdomen.  They recommend IR consult for embolization.  ? ?ROS : Review of Systems  ?Gastrointestinal:  Positive for blood in stool. Negative for abdominal pain, constipation, diarrhea, heartburn, melena, nausea and vomiting.  ?Genitourinary:  Negative for dysuria and hematuria.  ?Neurological:  Negative for dizziness and headaches.  ? ? ? ?Objective: ?Vital signs in last 24 hours: ?Vitals:  ? 05/12/21 2121 05/13/21 0514  ?BP: 117/76 120/89  ?Pulse: 62 73  ?Resp: 18 18  ?Temp: 98.7 ?F (37.1 ?C) 98.6 ?F (37 ?C)  ?SpO2: 98% 99%  ? ? ?Physical Exam: ? ?General:  Alert, cooperative, no distress, appears stated age  ?Head:  Normocephalic, without obvious abnormality, atraumatic  ?Eyes:  Anicteric sclera, EOM's intact  ?Lungs:   Clear to auscultation bilaterally, respirations unlabored  ?Heart:  Regular rate and rhythm, S1, S2 normal  ?Abdomen:   Soft, non-tender, bowel sounds active all four quadrants,  no masses,   ?Extremities: Extremities normal, atraumatic, no  edema  ?Pulses: 2+ and symmetric  ? ? ?Lab Results: ?Recent Labs  ?  05/11/21 ?4650 05/12/21 ?3546  ?NA 141 138  ?K 3.6 3.7  ?CL 107 105  ?CO2 26 27  ?GLUCOSE 107* 110*  ?BUN 16 15  ?CREATININE 1.15 1.25*  ?CALCIUM 9.1 8.8*  ? ?Recent Labs  ?  05/10/21 ?1305  ?AST 18  ?ALT 10  ?ALKPHOS 39  ?BILITOT 0.7  ?PROT 7.1  ?ALBUMIN 4.5  ? ?Recent Labs  ?  05/10/21 ?1305 05/11/21 ?5681 05/12/21 ?2751  05/12/21 ?1623 05/13/21 ?0505  ?WBC 6.6   < > 6.0  --  6.5  ?NEUTROABS 4.8  --   --   --   --   ?HGB 10.1*   < > 7.7* 9.3* 9.0*  ?HCT 29.9*   < > 23.6* 26.7* 27.1*  ?MCV 90.9   < > 94.0  --  92.5  ?PLT 133*   < > 119*  --  117*  ? < > = values in this interval not displayed.  ? ?No results for input(s): LABPROT, INR in the last 72 hours. ? ? ? ?Assessment ?Hematochezia ?-CT angio: Positive for active bleeding within the mid sigmoid colon.  Patient has a large infrarenal abdominal aortic aneurysm that would be difficult to access small bleeding lesion in the mid sigmoid colon ?-Hgb 9.3(7.7), received 1 unit of pRBC 4/17, normal MCV ?-BUN 15, creatinine 1.25 ? ? ?Plan: ?Patient with improving rectal bleeding and anemia. Will continue to monitor. If patient becomes unstable, please call interventional radiology ?Continue daily CBC and transfuse as needed to maintain HGB > 7  ?Advance to full liquid diet.  ?[facility] will follow ? ?Charlott Rakes PA-C ?05/13/2021, 9:51 AM ? ?Contact #  [phone]  ?

## 2021-05-13 NOTE — Progress Notes (Addendum)
? ?PROGRESS NOTE ? ? ? ?Lonnie Hansen  TGY:[id number] DOB: Mar 17, 1950 DOA: 05/10/2021 ?PCP: [name], MD ? ? ?Brief Narrative: ?Lonnie Hansen is a 71 y.o. male with a history of AAA, AVR, chronic ITP, GERD, hyperlipidemia, OSA on CPAP, hypertension, GI bleeding. Patient reports black clots and red blood per rectum starting about two days prior with associated acute anemia concerning for active GI bleeding. GI consulted. CTA abdomen/pelvis significant for sigmoid colon bleed. GI recommending general surgery consult for consideration of left hemicolectomy. General surgery have deferred consideration of hemicolectomy at this time. GI bleeding appears to be improved. ? ? ?Assessment and Plan: ?* Diverticulosis of intestine with bleeding ?Normal BUN. Secondary to diverticular bleeding confirmed by bleeding vessel seen on CTA abdomen/pelvis ?-Protonix IV ?-Clear liquid diet; advance per GI ?-GI recommendations: pending today ? ?Acute blood loss anemia ?Secondary to GI bleeding. Baseline hemoglobin of about 12. Hemoglobin down to 7.7 on 4/17 with subsequent 1 unit of pRBC transfused. Post-transfusion hemoglobin of 9.3 > 9. GI recommendations for transfusion for hemoglobin < 8. ?-CBC in AM ?-H&H this afternoon ? ?Chronic ITP (idiopathic thrombocytopenia) (HCC) ?Noted. Platelets of 133 on admission and drifting down slightly. Patient follows with [name] as an outpatient. ? ?Diverticulosis ?Noted on history. Contributing to current presentation. ? ?GERD (gastroesophageal reflux disease) ?-Continue Protonix ? ?Hypertension ?Patient is on amlodipine and atenolol as an outpatient. Blood pressure stable. ?-Continue atenolol and hold amlodipine ? ?Hyperlipidemia ?-Continue Crestor ? ?OSA on CPAP ?-CPAP qhs ? ? ? ?DVT prophylaxis: SCDs ?Code Status:   Code Status: Full Code ?Family Communication: None at bedside ?Disposition Plan: Discharge home pending GI/general surgery recommendations/management. Likely in 24  hours. PT/OT consulted. ? ? ?Consultants:  ?[facility] ?General surgery ? ?Procedures:  ?None ? ?Antimicrobials: ?None  ? ? ?Subjective: ?No hematochezia overnight. Had a solid bowel movement but did notice continued dark stools ? ?Objective: ?BP 120/89 (BP Location: Right Arm)   Pulse 73   Temp 98.6 ?F (37 ?C) (Oral)   Resp 18   Ht '5\' 6"'$  (1.676 m)   Wt 90.3 kg   SpO2 99%   BMI 32.12 kg/m?  ? ?Examination: ? ?General exam: Appears calm and comfortable ?Respiratory system: Clear to auscultation. Respiratory effort normal. ?Cardiovascular system: S1 & S2 heard, RRR. 2/6 systolic murmur with click heard ?Gastrointestinal system: Abdomen is nondistended, soft and nontender. Normal bowel sounds heard. ?Central nervous system: Alert and oriented. No focal neurological deficits. ?Musculoskeletal: No edema. No calf tenderness ?Skin: No cyanosis. No rashes ?Psychiatry: Judgement and insight appear normal. Mood & affect appropriate.  ? ? ?Data Reviewed: I have personally reviewed following labs and imaging studies ? ?CBC ?Lab Results  ?Component Value Date  ? WBC 6.5 05/13/2021  ? RBC 2.93 (L) 05/13/2021  ? HGB 9.0 (L) 05/13/2021  ? HCT 27.1 (L) 05/13/2021  ? MCV 92.5 05/13/2021  ? MCH 30.7 05/13/2021  ? PLT 117 (L) 05/13/2021  ? MCHC 33.2 05/13/2021  ? RDW 15.3 05/13/2021  ? LYMPHSABS 1.1 05/10/2021  ? MONOABS 0.7 05/10/2021  ? EOSABS 0.1 05/10/2021  ? BASOSABS 0.0 05/10/2021  ? ? ? ?Last metabolic panel ?Lab Results  ?Component Value Date  ? NA 138 05/12/2021  ? K 3.7 05/12/2021  ? CL 105 05/12/2021  ? CO2 27 05/12/2021  ? BUN 15 05/12/2021  ? CREATININE 1.25 (H) 05/12/2021  ? GLUCOSE 110 (H) 05/12/2021  ? GFRNONAA >60 05/12/2021  ? GFRAA 82 05/11/2019  ? CALCIUM 8.8 (  L) 05/12/2021  ? PHOS 3.1 05/25/2020  ? PROT 7.1 05/10/2021  ? ALBUMIN 4.5 05/10/2021  ? LABGLOB 2.6 08/17/2018  ? AGRATIO 1.8 08/17/2018  ? BILITOT 0.7 05/10/2021  ? ALKPHOS 39 05/10/2021  ? AST 18 05/10/2021  ? ALT 10 05/10/2021  ? ANIONGAP 6 05/12/2021   ? ? ?GFR: ?Estimated Creatinine Clearance: 57 mL/min (A) (by C-G formula based on SCr of 1.25 mg/dL (H)). ? ?No results found for this or any previous visit (from the past 240 hour(s)).  ? ? ?Radiology Studies: ?CT Angio Abd/Pel w/ and/or w/o ? ?Result Date: 05/11/2021 ?CLINICAL DATA:  Evaluate for lower GI bleed. Patient reports black clots and red blood per rectum which began 2 days prior. Anemia. EXAM: CTA ABDOMEN AND PELVIS WITHOUT AND WITH CONTRAST TECHNIQUE: Multidetector CT imaging of the abdomen and pelvis was performed using the standard protocol during bolus administration of intravenous contrast. Multiplanar reconstructed images and MIPs were obtained and reviewed to evaluate the vascular anatomy. RADIATION DOSE REDUCTION: This exam was performed according to the departmental dose-optimization program which includes automated exposure control, adjustment of the mA and/or kV according to patient size and/or use of iterative reconstruction technique. CONTRAST:  177m OMNIPAQUE IOHEXOL 350 MG/ML SOLN COMPARISON:  05/31/2020 FINDINGS: VASCULAR Aorta: Aneurysmal dilatation scratch set there is fusiform aneurysmal dilatation of the infrarenal abdominal aorta with maximum diameter of 4.9 cm, image 102/5. Formally this had a maximum diameter 4.8 cm. Wall adherent mural thrombus identified within the aneurysmal segment of the abdominal aorta. Celiac: Patent without evidence of aneurysm, dissection, vasculitis or significant stenosis. SMA: Patent without evidence of aneurysm, dissection, vasculitis or significant stenosis. Renals: Both renal arteries are patent without evidence of aneurysm, dissection, vasculitis, fibromuscular dysplasia or significant stenosis. IMA: Patent without evidence of aneurysm, dissection, vasculitis or significant stenosis. Inflow: Patent without evidence of aneurysm, dissection, vasculitis or significant stenosis. Proximal Outflow: Bilateral common femoral and visualized portions of the  superficial and profunda femoral arteries are patent without evidence of aneurysm, dissection, vasculitis or significant stenosis. Veins: No obvious venous abnormality within the limitations of this arterial phase study. Review of the MIP images confirms the above findings. NON-VASCULAR Lower chest: No acute abnormality. Hepatobiliary: 7 mm low-density structure within dome of left hepatic lobe is unchanged and remains too small to characterize. No suspicious liver abnormality. Status post cholecystectomy. No bile duct dilatation. Pancreas: Unremarkable. No pancreatic ductal dilatation or surrounding inflammatory changes. Spleen: Normal in size without focal abnormality. Adrenals/Urinary Tract: Adrenal glands appear normal. Unchanged appearance of bilateral Bosniak class 1 cysts. The largest is in the right renal sinus measuring 4.5 cm. No follow-up recommended. Calcification is identified within the upper pole of the left kidney measuring 1.3 cm. No hydronephrosis identified bilaterally. Small diverticulum arises off the anterior dome of the bladder Stomach/Bowel: Stomach appears normal. The appendix is visualized and is within normal limits. There is no bowel wall thickening, inflammation, or distension identified. Extensive sigmoid diverticulosis identified. Wall thickening without surrounding inflammation is noted involving the sigmoid colon likely reflecting sequelae of chronic diverticular disease with circular muscle hypertrophy. There is a small focus of intraluminal contrast within the left lateral wall of the mid sigmoid colon, image 15/5. There is mild progressive accumulation intraluminal contrast noted on the delayed series, image 63/3, compatible with active bleeding. Lymphatic: No significant vascular findings are present. No enlarged abdominal or pelvic lymph nodes. Reproductive: Prostate gland enlargement noted. Other: No free fluid or fluid collections within the abdomen or pelvis. No signs  of  pneumoperitoneum. Musculoskeletal: Advanced degenerative changes are identified involving the right hip joint. Multilevel degenerative disc disease noted throughout the lumbar spine. No acute or suspicious osseous

## 2021-05-13 NOTE — Progress Notes (Signed)
Patient had lab work and he informed this nurse that he became dizzy during the blood draw. VS obtained and are stable. Patient is still alert and oriented x4. No changes from prior assessment. Notified Dr. Lonny Prude.  ? ? ? 05/13/21 1747  ?Vitals  ?Temp 98.3 ?F (36.8 ?C)  ?Temp Source Oral  ?BP (!) 137/93  ?MAP (mmHg) 107  ?BP Location Right Arm  ?BP Method Automatic  ?Patient Position (if appropriate) Lying  ?Pulse Rate 80  ?Pulse Rate Source Monitor  ?Resp 16  ?MEWS COLOR  ?MEWS Score Color Green  ?Oxygen Therapy  ?SpO2 100 %  ?O2 Device Room Air  ?MEWS Score  ?MEWS Temp 0  ?MEWS Systolic 0  ?MEWS Pulse 0  ?MEWS RR 0  ?MEWS LOC 0  ?MEWS Score 0  ? ? ? ?

## 2021-05-13 NOTE — Progress Notes (Signed)
Niece asked for a call regarding concerns for patient's care and plan of care.  We had a very long discussion, over 40 minutes, discussing surgical recommendations, but also the algorithm for this type of problem.  He is is currently not bleeding which makes this more difficult as doing an endoscopic evaluation or IR angiography in the setting of not active bleeding is unyielding.  We discussed that these options are viable good options that can generally, if done during active bleeding, eliminate the problem without needing a much more invasive, and his this patient's case, a riskier operation.  She would like for there to be a plan moving forward if the patient were to rebleed.  Again we discussed the recommendations of endoscopic evaluation being first line, then IR embolization second line, and surgery as the last line if a bleeding site has been identified (in his case, he had a positive bleeding scan on Sunday which would likely to still be the source at this time if he were to rebleed.)  We also discussed that I would discuss with Dr. Johney Maine about reaching out to vascular to discuss his aneurysm and how this may potentially limit or not invasive intervention.  She was appreciative of the phone call.  I will reach out to Dr. Lonny Prude to make him aware of this discussion as well. ? ?Henreitta Cea ?4:59 PM ?05/13/2021 ? ?

## 2021-05-14 ENCOUNTER — Inpatient Hospital Stay (HOSPITAL_COMMUNITY): Payer: Medicare Other | Admitting: Certified Registered"

## 2021-05-14 ENCOUNTER — Encounter (HOSPITAL_COMMUNITY): Payer: Self-pay | Admitting: Student

## 2021-05-14 ENCOUNTER — Encounter (HOSPITAL_COMMUNITY)
Admission: EM | Disposition: A | Discharge status: Home / Self Care | Payer: Self-pay | Source: Home / Self Care | Attending: Family Medicine

## 2021-05-14 DIAGNOSIS — K635 Polyp of colon: Secondary | ICD-10-CM

## 2021-05-14 DIAGNOSIS — D693 Immune thrombocytopenic purpura: Secondary | ICD-10-CM | POA: Diagnosis not present

## 2021-05-14 DIAGNOSIS — K648 Other hemorrhoids: Secondary | ICD-10-CM

## 2021-05-14 DIAGNOSIS — K6389 Other specified diseases of intestine: Secondary | ICD-10-CM

## 2021-05-14 DIAGNOSIS — K5790 Diverticulosis of intestine, part unspecified, without perforation or abscess without bleeding: Secondary | ICD-10-CM | POA: Diagnosis not present

## 2021-05-14 DIAGNOSIS — K5731 Diverticulosis of large intestine without perforation or abscess with bleeding: Secondary | ICD-10-CM

## 2021-05-14 HISTORY — PX: COLONOSCOPY WITH PROPOFOL: SHX5780

## 2021-05-14 HISTORY — PX: POLYPECTOMY: SHX5525

## 2021-05-14 LAB — BASIC METABOLIC PANEL
Anion gap: 11 (ref 5–15)
BUN: 12 mg/dL (ref 8–23)
CO2: 23 mmol/L (ref 22–32)
Calcium: 8.7 mg/dL — ABNORMAL LOW (ref 8.9–10.3)
Chloride: 103 mmol/L (ref 98–111)
Creatinine, Ser: 1.21 mg/dL (ref 0.61–1.24)
GFR, Estimated: >60 mL/min (ref >60)
Glucose, Bld: 88 mg/dL (ref 70–99)
Potassium: 3.4 mmol/L — ABNORMAL LOW (ref 3.5–5.1)
Sodium: 137 mmol/L (ref 135–145)

## 2021-05-14 LAB — CBC
HCT: 28.1 % — ABNORMAL LOW (ref 39.0–52.0)
Hemoglobin: 9.3 g/dL — ABNORMAL LOW (ref 13.0–17.0)
MCH: 31 pg (ref 26.0–34.0)
MCHC: 33.1 g/dL (ref 30.0–36.0)
MCV: 93.7 fL (ref 80.0–100.0)
Platelets: 117 10*3/uL — ABNORMAL LOW (ref 150–400)
RBC: 3 MIL/uL — ABNORMAL LOW (ref 4.22–5.81)
RDW: 15.1 % (ref 11.5–15.5)
WBC: 6.9 10*3/uL (ref 4.0–10.5)
nRBC: 0 % (ref 0.0–0.2)

## 2021-05-14 SURGERY — COLONOSCOPY WITH PROPOFOL
Anesthesia: Monitor Anesthesia Care

## 2021-05-14 MED ORDER — FENTANYL CITRATE (PF) 100 MCG/2ML IJ SOLN
INTRAMUSCULAR | Status: AC
Start: 2021-05-14 — End: ?
  Filled 2021-05-14: qty 2

## 2021-05-14 MED ORDER — SODIUM CHLORIDE 0.9 % IV SOLN
INTRAVENOUS | Status: DC | PRN
Start: 2021-05-14 — End: 2021-05-14

## 2021-05-14 MED ORDER — PROPOFOL 500 MG/50ML IV EMUL
INTRAVENOUS | Status: AC
  Filled 2021-05-14: qty 50

## 2021-05-14 MED ORDER — PROPOFOL 500 MG/50ML IV EMUL
INTRAVENOUS | Status: DC | PRN
  Administered 2021-05-14: 125 ug/kg/min via INTRAVENOUS

## 2021-05-14 MED ORDER — PANTOPRAZOLE SODIUM 40 MG IV SOLR: 40.0000 mg | INTRAVENOUS | Status: DC

## 2021-05-14 MED ORDER — LIDOCAINE 2% (20 MG/ML) 5 ML SYRINGE
INTRAMUSCULAR | Status: DC | PRN
  Administered 2021-05-14: 80 mg via INTRAVENOUS

## 2021-05-14 MED ORDER — LACTATED RINGERS IV SOLN: INTRAVENOUS | Status: DC | PRN

## 2021-05-14 MED ORDER — PROPOFOL 10 MG/ML IV BOLUS
INTRAVENOUS | Status: DC | PRN
  Administered 2021-05-14 (×2): 20 mg via INTRAVENOUS

## 2021-05-14 SURGICAL SUPPLY — 22 items

## 2021-05-14 NOTE — Op Note (Signed)
[facility] ?Patient Name: Lonnie Hansen ?Procedure Date: 05/14/2021 ?MRN: [id number] ?Attending MD: Ronnette Juniper , MD ?Date of Birth: 1950/08/15 ?CSN: [id number] ?Age: 71 ?Admit Type: Inpatient ?Procedure:                Colonoscopy ?Indications:              Hematochezia, last colonoscopy was in 5/22 ?Providers:                Ronnette Juniper, MD, Mikey College, RN, Frazier Richards,  ?                          Technician ?Referring MD:             [facility] ?Medicines:                Monitored Anesthesia Care ?Complications:            No immediate complications. Estimated blood loss:  ?                          Minimal. ?Estimated Blood Loss:     Estimated blood loss was minimal. ?Procedure:                Pre-Anesthesia Assessment: ?                          - Prior to the procedure, a History and Physical  ?                          was performed, and patient medications and  ?                          allergies were reviewed. The patient's tolerance of  ?                          previous anesthesia was also reviewed. The risks  ?                          and benefits of the procedure and the sedation  ?                          options and risks were discussed with the patient.  ?                          All questions were answered, and informed consent  ?                          was obtained. Prior Anticoagulants: The patient has  ?                          taken no previous anticoagulant or antiplatelet  ?                          agents. ASA Grade Assessment: II - A patient with  ?  mild systemic disease. After reviewing the risks  ?                          and benefits, the patient was deemed in  ?                          satisfactory condition to undergo the procedure. ?                          After obtaining informed consent, the colonoscope  ?                          was passed under direct vision. Throughout the  ?                          procedure, the  patient's blood pressure, pulse, and  ?                          oxygen saturations were monitored continuously. The  ?                          [id number]-HQ190L ([id number]) Olympus colonoscope was  ?                          introduced through the anus and advanced to the the  ?                          terminal ileum. The colonoscopy was performed  ?                          without difficulty. The patient tolerated the  ?                          procedure well. The colonoscopy was performed  ?                          without difficulty. The patient tolerated the  ?                          procedure well. The quality of the bowel  ?                          preparation was good. ?Scope In: 2:49:00 PM ?Scope Out: 3:05:38 PM ?Scope Withdrawal Time: 0 hours 11 minutes 22 seconds  ?Total Procedure Duration: 0 hours 16 minutes 38 seconds  ?Findings: ?     Hemorrhoids were found on perianal exam. ?     The terminal ileum appeared normal. ?     Scattered small and large-mouthed diverticula were found in the  ?     recto-sigmoid colon, sigmoid colon, descending colon and transverse  ?     colon upto 65 cm from anus. ?     A 3 mm polyp was found in the transverse colon. The polyp was sessile.  ?     The polyp was removed with a cold biopsy forceps. Resection and  ?  retrieval were complete. ?     A patchy area of moderately congested, erythematous and thickened folds  ?     of the mucosa was found in the sigmoid colon, findings compatible with  ?     mucosal hypertrophy and mucosal prolapse. Biopsies were taken with a  ?     cold forceps for histology. ?     Non-bleeding internal hemorrhoids were found during retroflexion. The  ?     hemorrhoids were large. ?Impression:               - Hemorrhoids found on perianal exam. ?                          - The examined portion of the ileum was normal. ?                          - Diverticulosis in the recto-sigmoid colon, in the  ?                          sigmoid colon, in the  descending colon and in the  ?                          transverse colon. ?                          - One 3 mm polyp in the transverse colon, removed  ?                          with a cold biopsy forceps. Resected and retrieved. ?                          - Congested, erythematous and thickened folds of  ?                          the mucosa in the sigmoid colon. Biopsied. ?                          - Non-bleeding internal hemorrhoids. ?Moderate Sedation: ?     Patient did not receive moderate sedation for this procedure, but  ?     instead received monitored anesthesia care. ?Recommendation:           - High fiber diet. ?                          - Continue present medications. ?                          - Await pathology results. ?                          - Repeat colonoscopy for surveillance based on  ?                          pathology results. ?Procedure Code(s):        --- Professional --- ?  89373, Colonoscopy, flexible; with biopsy, single  ?                          or multiple ?Diagnosis Code(s):        --- Professional --- ?                          S28.7, Other hemorrhoids ?                          K63.5, Polyp of colon ?                          K63.89, Other specified diseases of intestine ?                          K92.1, Melena (includes Hematochezia) ?                          K57.30, Diverticulosis of large intestine without  ?                          perforation or abscess without bleeding ?CPT copyright 2019 [employer]. All rights reserved. ?The codes documented in this report are preliminary and upon coder review may  ?be revised to meet current compliance requirements. ?Ronnette Juniper, MD ?05/14/2021 3:14:23 PM ?This report has been signed electronically. ?Number of Addenda: 0 ?

## 2021-05-14 NOTE — Plan of Care (Signed)
Pt complains of dizziness this shift. Expresses anxiety with HGB dropping. Pt asked to call for help to [facility] and reassured that we are keeping watch for HGB and active bleeding. ?Problem: Education: ?Goal: Knowledge of General Education information will improve ?Description: Including pain rating scale, medication(s)/side effects and non-pharmacologic comfort measures ?05/14/2021 0456 by Maricela Bo, RN ?Outcome: Progressing ?05/14/2021 0456 by Maricela Bo, RN ?Outcome: Progressing ?  ?Problem: Health Behavior/Discharge Planning: ?Goal: Ability to manage health-related needs will improve ?05/14/2021 0456 by Maricela Bo, RN ?Outcome: Progressing ?05/14/2021 0456 by Maricela Bo, RN ?Outcome: Progressing ?  ?Problem: Clinical Measurements: ?Goal: Ability to maintain clinical measurements within normal limits will improve ?05/14/2021 0456 by Maricela Bo, RN ?Outcome: Progressing ?05/14/2021 0456 by Maricela Bo, RN ?Outcome: Progressing ?Goal: Will remain free from infection ?05/14/2021 0456 by Maricela Bo, RN ?Outcome: Progressing ?05/14/2021 0456 by Maricela Bo, RN ?Outcome: Progressing ?Goal: Diagnostic test results will improve ?05/14/2021 0456 by Maricela Bo, RN ?Outcome: Progressing ?05/14/2021 0456 by Maricela Bo, RN ?Outcome: Progressing ?Goal: Respiratory complications will improve ?05/14/2021 0456 by Maricela Bo, RN ?Outcome: Progressing ?05/14/2021 0456 by Maricela Bo, RN ?Outcome: Progressing ?Goal: Cardiovascular complication will be avoided ?05/14/2021 0456 by Maricela Bo, RN ?Outcome: Progressing ?05/14/2021 0456 by Maricela Bo, RN ?Outcome: Progressing ?  ?Problem: Activity: ?Goal: Risk for activity intolerance will decrease ?05/14/2021 0456 by Maricela Bo, RN ?Outcome: Progressing ?05/14/2021 0456 by Maricela Bo, RN ?Outcome: Progressing ?  ?Problem: Nutrition: ?Goal: Adequate nutrition will be maintained ?05/14/2021 0456 by Maricela Bo, RN ?Outcome:  Progressing ?05/14/2021 0456 by Maricela Bo, RN ?Outcome: Progressing ?  ?Problem: Coping: ?Goal: Level of anxiety will decrease ?Outcome: Progressing ?  ?Problem: Elimination: ?Goal: Will not experience complications related to bowel motility ?05/14/2021 0456 by Maricela Bo, RN ?Outcome: Progressing ?05/14/2021 0456 by Maricela Bo, RN ?Outcome: Progressing ?Goal: Will not experience complications related to urinary retention ?05/14/2021 0456 by Maricela Bo, RN ?Outcome: Progressing ?05/14/2021 0456 by Maricela Bo, RN ?Outcome: Progressing ?  ?Problem: Pain Managment: ?Goal: General experience of comfort will improve ?05/14/2021 0456 by Maricela Bo, RN ?Outcome: Progressing ?05/14/2021 0456 by Maricela Bo, RN ?Outcome: Progressing ?  ?Problem: Safety: ?Goal: Ability to remain free from injury will improve ?05/14/2021 0456 by Maricela Bo, RN ?Outcome: Progressing ?05/14/2021 0456 by Maricela Bo, RN ?Outcome: Progressing ?  ?Problem: Skin Integrity: ?Goal: Risk for impaired skin integrity will decrease ?05/14/2021 0456 by Maricela Bo, RN ?Outcome: Progressing ?05/14/2021 0456 by Maricela Bo, RN ?Outcome: Progressing ?  ?Problem: Education: ?Goal: Ability to identify signs and symptoms of gastrointestinal bleeding will improve ?05/14/2021 0456 by Maricela Bo, RN ?Outcome: Progressing ?05/14/2021 0456 by Maricela Bo, RN ?Outcome: Progressing ?  ?Problem: Bowel/Gastric: ?Goal: Will show no signs and symptoms of gastrointestinal bleeding ?Outcome: Progressing ?  ?Problem: Fluid Volume: ?Goal: Will show no signs and symptoms of excessive bleeding ?05/14/2021 0456 by Maricela Bo, RN ?Outcome: Progressing ?05/14/2021 0456 by Maricela Bo, RN ?Outcome: Progressing ?  ?Problem: Clinical Measurements: ?Goal: Complications related to the disease process, condition or treatment will be avoided or minimized ?05/14/2021 0456 by Maricela Bo, RN ?Outcome: Progressing ?05/14/2021 0456 by  Maricela Bo, RN ?Outcome: Progressing ?  ?

## 2021-05-14 NOTE — Interval H&P Note (Signed)
History and Physical Interval Note: ?70/male with intermittent lower GI bleeding for a colonoscopy with propofol. ? ?05/14/2021 ?1:25 PM ? ?[name]  has presented today for colonoscopy with propofol, with the diagnosis of Hematochezia.  The various methods of treatment have been discussed with the patient and family. After consideration of risks, benefits and other options for treatment, the patient has consented to  Procedure(s): ?COLONOSCOPY WITH PROPOFOL (N/A) as a surgical intervention.  The patient's history has been reviewed, patient examined, no change in status, stable for surgery.  I have reviewed the patient's chart and labs.  Questions were answered to the patient's satisfaction.   ? ? ?Lonnie Hansen ? ? ?

## 2021-05-14 NOTE — Progress Notes (Addendum)
? ? ?   ?Subjective: ?Patient with no further bleeding.  Prepping right now for a cscope.  Wife present in the room today ? ?ROS: See above, otherwise other systems negative ? ?Objective: ?Vital signs in last 24 hours: ?Temp:  [98.1 ?F (36.7 ?C)-98.4 ?F (36.9 ?C)] 98.4 ?F (36.9 ?C) (04/19 0451) ?Pulse Rate:  [62-80] 62 (04/19 0451) ?Resp:  [16-20] 20 (04/19 0451) ?BP: (117-137)/(63-93) 117/63 (04/19 0451) ?SpO2:  [99 %-100 %] 100 % (04/19 0451) ?Last BM Date : 05/14/21 ? ?Intake/Output from previous day: ?04/18 0701 - 04/19 0700 ?In: 800 [P.O.:800] ?Out: 900 [Urine:900] ?Intake/Output this shift: ?No intake/output data recorded. ? ?PE: ?Abd: soft, NT, ND ? ?Lab Results:  ?Recent Labs  ?  05/13/21 ?0505 05/13/21 ?1724 05/14/21 ?2725  ?WBC 6.5  --  6.9  ?HGB 9.0* 8.6* 9.3*  ?HCT 27.1* 26.7* 28.1*  ?PLT 117*  --  117*  ? ?BMET ?Recent Labs  ?  05/12/21 ?3664 05/14/21 ?4034  ?NA 138 137  ?K 3.7 3.4*  ?CL 105 103  ?CO2 27 23  ?GLUCOSE 110* 88  ?BUN 15 12  ?CREATININE 1.25* 1.21  ?CALCIUM 8.8* 8.7*  ? ?PT/INR ?No results for input(s): LABPROT, INR in the last 72 hours. ?CMP  ?   ?Component Value Date/Time  ? NA 137 05/14/2021 0528  ? NA 140 05/11/2019 0946  ? K 3.4 (L) 05/14/2021 0528  ? CL 103 05/14/2021 0528  ? CO2 23 05/14/2021 0528  ? GLUCOSE 88 05/14/2021 0528  ? BUN 12 05/14/2021 0528  ? BUN 17 05/11/2019 0946  ? CREATININE 1.21 05/14/2021 0528  ? CREATININE 1.15 05/09/2021 1309  ? CREATININE 0.75 04/10/2016 1132  ? CALCIUM 8.7 (L) 05/14/2021 0528  ? PROT 7.1 05/10/2021 1305  ? PROT 7.3 08/17/2018 1203  ? ALBUMIN 4.5 05/10/2021 1305  ? ALBUMIN 4.7 08/17/2018 1203  ? AST 18 05/10/2021 1305  ? AST 19 05/09/2021 1309  ? ALT 10 05/10/2021 1305  ? ALT 12 05/09/2021 1309  ? ALKPHOS 39 05/10/2021 1305  ? BILITOT 0.7 05/10/2021 1305  ? BILITOT 0.7 05/09/2021 1309  ? GFRNONAA >60 05/14/2021 0528  ? GFRNONAA >60 05/09/2021 1309  ? GFRAA 82 05/11/2019 0946  ? GFRAA >60 04/19/2018 1055  ? ?Lipase  ?   ?Component Value  Date/Time  ? LIPASE 47 12/18/2020 1249  ? ? ? ? ? ?Studies/Results: ?No results found. ? ?Anti-infectives: ?Anti-infectives (From admission, onward)  ? ? None  ? ?  ? ? ? ?Assessment/Plan ?Lower GI bleed, diverticular in origin ?-no further bleeding at this time ?-VERY lengthy discussion again with patient and today with wife regarding algorithm for GI bleed management.  Both understand this. ?-surgery is last resort.  With that being said, we have reached out to the vascular surgeon who is following him to discuss if he were to need a surgery what are the realistic expectations regarding his aneurysms and risks for complications etc.  Have not heard back yet. ?-surgically patient does not require intervention from [place] at this time.   ?-wife did state patient takes iron at home and is not compliant really with his miralax causing to constipation and issues with that.   ?-refer to previous notes regarding surgical recommendations for it patient were to rebleed. ?-further comments and recommendations per Dr. Johney Maine' attestations ?-wife requests all communication be via patient and her, Kennyth Lose, and not with niece, Margreta Journey. ?  ?FEN - NPO for procedure ?VTE - SCDs, on hold ?ID -  none needed ?  ?Infrarenal abdominal aortic aneurysm ?Iliac aneurysm ?AVR ?Chronic ITP ?GERD ?HLD ?OSA on CPAP ?HTN ?Slight AKI ?ABL anemia ?CAD ? ?I reviewed Consultant GI notes, hospitalist notes, last 24 h vitals and pain scores, last 48 h intake and output, last 24 h labs and trends, and last 24 h imaging results. ? ?I spent >60 minutes face to face, reviewing chart, documenting, and contacting multiple specialities regarding this patient's care and coordination of care ? ? LOS: 3 days  ? ? ?Henreitta Cea , PA-C ?[facility] ?05/14/2021, 12:04 PM ?Please see Amion for pager number during day hours 7:00am-4:30pm or 7:00am -11:30am on weekends ? ?

## 2021-05-14 NOTE — TOC Initial Note (Signed)
Transition of Care (TOC) - Initial/Assessment Note  ? ? ?Patient Details  ?Name: Lonnie Hansen ?MRN: [id number] ?Date of Birth: Oct 13, 1950 ? ?Transition of Care (TOC) CM/SW Contact:    ?Tawanna Cooler, RN ?Phone Number: ?05/14/2021, 8:24 AM ? ?Clinical Narrative:                 ? ?Patient from home with spouse.  Uses a cane at baseline.  Has a cpap.  ?PT eval pending.   ?TOC following for discharge needs.   ? ? ?Expected Discharge Plan: Home/Self Care ?Barriers to Discharge: Continued Medical Work up ? ? ?Expected Discharge Plan and Services ?Expected Discharge Plan: Home/Self Care ?  ?  ?Living arrangements for the past 2 months: [place] ?                ?  ?Prior Living Arrangements/Services ?Living arrangements for the past 2 months: [place] ?Lives with:: Spouse ?Patient language and need for interpreter reviewed:: Yes ?       ?Need for Family Participation in Patient Care: Yes (Comment) ?Care giver support system in place?: Yes (comment) ?  ?Criminal Activity/Legal Involvement Pertinent to Current Situation/Hospitalization: No - Comment as needed ? ?Activities of Daily Living ?Home Assistive Devices/Equipment: Eyeglasses, Kasandra Knudsen (specify quad or straight) ?ADL Screening (condition at time of admission) ?Patient's cognitive ability adequate to safely complete daily activities?: Yes ?Is the patient deaf or have difficulty hearing?: No ?Does the patient have difficulty seeing, even when wearing glasses/contacts?: No ?Does the patient have difficulty concentrating, remembering, or making decisions?: No ?Patient able to express need for assistance with ADLs?: Yes ?Does the patient have difficulty dressing or bathing?: No ?Independently performs ADLs?: Yes (appropriate for developmental age) ?Does the patient have difficulty walking or climbing stairs?: Yes ?Weakness of Legs: None ?Weakness of Arms/Hands: None ? ?Emotional Assessment ?  ?Orientation: : Oriented to Self, Oriented to Place,  Oriented to  Time, Oriented to Situation ?Alcohol / Substance Use: Not Applicable ?Psych Involvement: No (comment) ? ?Admission diagnosis:  Rectal bleeding [K62.5] ?Painless rectal bleeding [K62.5] ?Patient Active Problem List  ? Diagnosis Date Noted  ? Diverticulosis of intestine with bleeding 05/10/2021  ? Cholecystitis 12/18/2020  ? Hemorrhagic shock ([place])   ? Cardiac arrest [facility])   ? GIB (gastrointestinal bleeding) 05/24/2020  ? Acute blood loss anemia 04/14/2019  ? Unilateral primary osteoarthritis, 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  ? 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  ? OSA on CPAP 03/04/2017  ? Vocal cord nodule   ? Nocturia   ? Joint swelling   ? Joint pain   ? Hypertension   ? History of kidney stones   ? History of colon polyps   ? Headache   ? Glaucoma   ? GERD (gastroesophageal reflux disease)   ? Enlarged prostate   ? Enlarged aorta ([facility])   ? Diverticulosis   ? Coronary artery disease   ? Cataracts, bilateral   ? Bruising   ? Blood dyscrasia   ? Arthritis   ? Anemia   ? Hydropneumothorax 10/29/2016  ? S/P AVR 09/15/2016  ? Coronary artery disease involving native coronary artery of native heart without angina pectoris 04/29/2016  ? Aortic insufficiency 04/01/2016  ? Thoracic aortic aneurysm ([place]) 09/18/2015  ? HTN (hypertension) 09/18/2015  ? Hyperlipidemia 09/18/2015  ? Asymptomatic bilateral carotid artery stenosis 08/27/2015  ? ?PCP:  [name], MD ?Pharmacy:   ?[facility], [place] ?  [place] [address] ?Phone: [phone] Fax: [phone] ? ?

## 2021-05-14 NOTE — Anesthesia Preprocedure Evaluation (Addendum)
Anesthesia Evaluation  ?Patient identified by MRN, date of birth, ID band ?Patient awake ? ? ? ?Reviewed: ?Allergy & Precautions, NPO status , Patient's Chart, lab work & pertinent test results ? ?Airway ?Mallampati: II ? ?TM Distance: >3 FB ?Neck ROM: Full ? ? ? Dental ?no notable dental hx. ? ?  ?Pulmonary ?sleep apnea and Continuous Positive Airway Pressure Ventilation , former smoker,  ?  ?Pulmonary exam normal ? ? ? ? ? ? ? Cardiovascular ?hypertension, Pt. on medications and Pt. on home beta blockers ?+ CAD and + Peripheral Vascular Disease (AAA)  ? ?Rhythm:Regular Rate:Normal ? ?S/p AVR with ascending root replacement 2018 ?  ?Neuro/Psych ? Headaches, negative psych ROS  ? GI/Hepatic ?Neg liver ROS, GERD  Medicated,GIB ?  ?Endo/Other  ?negative endocrine ROS ? Renal/GU ?negative Renal ROS  ?negative genitourinary ?  ?Musculoskeletal ? ?(+) Arthritis , Osteoarthritis,   ? Abdominal ?Normal abdominal exam  (+)   ?Peds ? Hematology ? ?(+) Blood dyscrasia, anemia , ITP   ?Anesthesia Other Findings ? ? Reproductive/Obstetrics ? ?  ? ? ? ? ? ? ? ? ? ? ? ? ? ?  ?  ? ? ? ? ? ? ? ? ?Anesthesia Physical ?Anesthesia Plan ? ?ASA: 3 ? ?Anesthesia Plan: MAC  ? ?Post-op Pain Management:   ? ?Induction: Intravenous ? ?PONV Risk Score and Plan: 1 and Propofol infusion and Treatment may vary due to age or medical condition ? ?Airway Management Planned: Simple Face Mask, Natural Airway and Nasal Cannula ? ?Additional Equipment: None ? ?Intra-op Plan:  ? ?Post-operative Plan:  ? ?Informed Consent: I have reviewed the patients History and Physical, chart, labs and discussed the procedure including the risks, benefits and alternatives for the proposed anesthesia with the patient or authorized representative who has indicated his/her understanding and acceptance.  ? ? ? ?Dental advisory given ? ?Plan Discussed with: CRNA ? ?Anesthesia Plan Comments: (Lab Results ?     Component                Value                Date                 ?     WBC                      6.9                 05/14/2021           ?     HGB                      9.3 (L)             05/14/2021           ?     HCT                      28.1 (L)            05/14/2021           ?     MCV                      93.7                05/14/2021           ?  PLT                      117 (L)             05/14/2021           ?Lab Results ?     Component                Value               Date                 ?     NA                       137                 05/14/2021           ?     K                        3.4 (L)             05/14/2021           ?     CO2                      23                  05/14/2021           ?     GLUCOSE                  88                  05/14/2021           ?     BUN                      12                  05/14/2021           ?     CREATININE               1.21                05/14/2021           ?     CALCIUM                  8.7 (L)             05/14/2021           ?     GFRNONAA                 >60                 05/14/2021           ?? ?1. Left ventricular ejection fraction, by estimation, is 65 to 70%. The  ?left ventricle has normal function. The left ventricle has no regional  ?wall motion abnormalities. There is mild left ventricular hypertrophy.  ?Left ventricular diastolic parameters  ?were normal.  ??2. Right ventricular systolic function is normal. The right ventricular  ?size is normal.  ??3. Mild mitral valve regurgitation.  ??4. S/P AVR with 29 mm MagnaEase bioprosthesis (09/15/16) Peak and mean  ?gradients  throuugh the valve aer 22 and 11 mm Hg respectively. No  ?significant change from report of April 2021. . The aortic valve has been  ?repaired/replaced. Aortic valve  ?regurgitation is not visualized. )  ? ? ? ? ? ? ?Anesthesia Quick Evaluation ? ?

## 2021-05-14 NOTE — Evaluation (Signed)
Physical Therapy Evaluation ?Patient Details ?Name: Lonnie Hansen ?MRN: [id number] ?DOB: [birth year] ?Today's Date: 05/14/2021 ? ?History of Present Illness ? Pt is a 71 y.o. male admitted for diverticulosis of intestine with bleeding, Acute blood loss anemia. PMH significant for AAA, AVR, chronic ITP, GERD, hyperlipidemia, OSA on CPAP, hypertension, GI bleeding, and CAD. ?  ?Clinical Impression ? Pt is a 71 y.o. male with above HPI resulting in the deficits listed below (see PT Problem List). Pt ambulated total of ~64f with use of SPC and supervision for safety with no LOB observed. Pt will benefit from continued skilled PT in hospital setting to maximize functional mobility and increase independence. Anticipate no skilled PT needs upon d/c.  ?   ?   ? ?Recommendations for follow up therapy are one component of a multi-disciplinary discharge planning process, led by the attending physician.  Recommendations may be updated based on patient status, additional functional criteria and insurance authorization. ? ?Follow Up Recommendations No PT follow up ? ?  ?Assistance Recommended at Discharge Set up Supervision/Assistance  ?Patient can return home with the following ?   ? ?  ?Equipment Recommendations None recommended by PT  ?Recommendations for Other Services ?    ?  ?Functional Status Assessment Patient has had a recent decline in their functional status and demonstrates the ability to make significant improvements in function in a reasonable and predictable amount of time.  ? ?  ?Precautions / Restrictions Precautions ?Precautions: Fall ?Restrictions ?Weight Bearing Restrictions: No  ? ?  ? ?Mobility ? Bed Mobility ?Overal bed mobility: Modified Independent ?  ?  ?  ?  ?  ?  ?General bed mobility comments: mild dizziness upon sitting, resolved with rest prior to transfers. ?  ? ?Transfers ?Overall transfer level: Modified independent ?Equipment used: Straight cane ?  ?  ?  ?  ?  ?  ?  ?  ?   ? ?Ambulation/Gait ?Ambulation/Gait assistance: Supervision ?Gait Distance (Feet): 34 Feet ?Assistive device: Straight cane ?Gait Pattern/deviations: Step-through pattern, Decreased stride length ?Gait velocity: decr ?  ?  ?General Gait Details: limited distance due to frequency of toileting- pt prepping to have colonoscopy. Ambulated around room and to bathroom. no overt LOB observed. ? ?Stairs ?  ?  ?  ?  ?  ? ?Wheelchair Mobility ?  ? ?Modified Rankin (Stroke Patients Only) ?  ? ?  ? ?Balance Overall balance assessment: Needs assistance ?Sitting-balance support: Feet supported ?Sitting balance-Leahy Scale: Good ?  ?  ?Standing balance support: Single extremity supported, During functional activity ?Standing balance-Leahy Scale: Fair ?  ?  ?  ?  ?  ?  ?  ?  ?  ?  ?  ?  ?   ? ? ? ?Pertinent Vitals/Pain Pain Assessment ?Pain Assessment: No/denies pain  ? ? ?Home Living Family/patient expects to be discharged to:: Private residence ?Living Arrangements: Spouse/significant other ?Available Help at Discharge: Family ?Type of Home: House ?Home Access: Stairs to enter ?Entrance Stairs-Rails: Right ?Entrance Stairs-Number of Steps: 4 ?  ?Home Layout: Two level;Able to live on main level with bedroom/bathroom ?Home Equipment: Cane - single pGaffer(4 wheels) ?[facility] Reports increased weakness since not able to eat for the past week.  ?  ?Prior Function Prior Level of Function : Independent/Modified Independent;Driving ?  ?  ?  ?  ?  ?  ?Mobility Comments: SPC baseline. Reports supposed to be having Rt hip surgery at some point. ?  ?  ? ? ?  Hand Dominance  ? Dominant Hand: Right ? ?  ?Extremity/Trunk Assessment  ? Upper Extremity Assessment ?Upper Extremity Assessment: Overall WFL for tasks assessed ?  ? ?Lower Extremity Assessment ?Lower Extremity Assessment: Generalized weakness ?  ? ?Cervical / Trunk Assessment ?Cervical / Trunk Assessment: Normal   ?Communication  ? Communication: No difficulties  ?Cognition Arousal/Alertness: Awake/alert ?Behavior During Therapy: [facility] for tasks assessed/performed ?Overall Cognitive Status: Within Functional Limits for tasks assessed ?  ?  ?  ?  ?  ?  ?  ?  ?  ?  ?  ?  ?  ?  ?  ?  ?  ?  ?  ? ?  ?General Comments   ? ?  ?Exercises    ? ?Assessment/Plan  ?  ?PT Assessment Patient needs continued PT services  ?PT Problem List Decreased strength;Decreased activity tolerance;Decreased balance;Decreased mobility ? ?   ?  ?PT Treatment Interventions DME instruction;Gait training;Stair training;Functional mobility training;Therapeutic activities;Therapeutic exercise;Balance training;Patient/family education   ? ?PT Goals (Current goals can be found in the Care Plan section)  ?Acute Rehab PT Goals ?Patient Stated Goal: Get home and back to working out ?PT Goal Formulation: With patient/family ?Time For Goal Achievement: 05/28/21 ?Potential to Achieve Goals: Good ? ?  ?Frequency Min 3X/week ?  ? ? ?Co-evaluation   ?  ?  ?  ?  ? ? ?  ?AM-PAC PT "6 Clicks" Mobility  ?Outcome Measure Help needed turning from your back to your side while in a flat bed without using bedrails?: None ?Help needed moving from lying on your back to sitting on the side of a flat bed without using bedrails?: None ?Help needed moving to and from a bed to a chair (including a wheelchair)?: A Little ?Help needed standing up from a chair using your arms (e.g., wheelchair or bedside chair)?: A Little ?Help needed to walk in hospital room?: A Little ?Help needed climbing 3-5 steps with a railing? : A Little ?6 Click Score: 20 ? ?  ?End of Session   ?Activity Tolerance: Patient tolerated treatment well ?Patient left: in bed;with call bell/phone within reach;with family/visitor present ?Nurse Communication: Mobility status ?PT Visit Diagnosis: Unsteadiness on feet (R26.81);Muscle weakness (generalized) (M62.81) ?  ? ?Time: 7425-9563 ?PT Time Calculation (min) (ACUTE  ONLY): 21 min ? ? ?Charges:   PT Evaluation ?$PT Eval Low Complexity: 1 Low ?  ?  ?   ? ?Festus Barren., PT, DPT  ?Acute Rehabilitation Services  ?Office [phone] ? ? ?05/14/2021, 12:39 PM ? ?

## 2021-05-14 NOTE — Progress Notes (Signed)
OT Cancellation Note ? ?Patient Details ?Name: Lonnie Hansen ?MRN: [id number] ?DOB: 11-19-1950 ? ? ?Cancelled Treatment:    Reason Eval/Treat Not Completed: Other (comment) First attempt PA speaking with patient, then patient getting ready to go for colonoscopy. Will re-attempt 4/20. ? ?Delbert Phenix OT ?OT pager: [phone] ? ? ?Rosemary Holms ?05/14/2021, 1:01 PM ?

## 2021-05-14 NOTE — Anesthesia Postprocedure Evaluation (Signed)
Anesthesia Post Note ? ?Patient: Lonnie Hansen ? ?Procedure(s) Performed: COLONOSCOPY WITH PROPOFOL ?POLYPECTOMY ? ?  ? ?Patient location during evaluation: Endoscopy ?Anesthesia Type: MAC ?Level of consciousness: awake and alert ?Pain management: pain level controlled ?Vital Signs Assessment: post-procedure vital signs reviewed and stable ?Respiratory status: spontaneous breathing, nonlabored ventilation, respiratory function stable and patient connected to nasal cannula oxygen ?Cardiovascular status: stable and blood pressure returned to baseline ?Postop Assessment: no apparent nausea or vomiting ?Anesthetic complications: no ? ? ?No notable events documented. ? ?Last Vitals:  ?Vitals:  ? 05/14/21 1540 05/14/21 1545  ?BP: (!) 148/85   ?Pulse: (!) 59 (!) 57  ?Resp: 15 12  ?Temp:    ?SpO2: 98% 99%  ?  ?Last Pain:  ?Vitals:  ? 05/14/21 1512  ?TempSrc: Temporal  ?PainSc:   ? ? ?  ?  ?  ?  ?  ?  ? ?March Rummage [name] ? ? ? ? ?

## 2021-05-14 NOTE — Transfer of Care (Signed)
Immediate Anesthesia Transfer of Care Note ? ?Patient: Lonnie Hansen ? ?Procedure(s) Performed: COLONOSCOPY WITH PROPOFOL ?POLYPECTOMY ? ?Patient Location: Endoscopy Unit ? ?Anesthesia Type:MAC ? ?Level of Consciousness: drowsy and patient cooperative ? ?Airway & Oxygen Therapy: Patient Spontanous Breathing and Patient connected to face mask oxygen ? ?Post-op Assessment: Report given to RN and Post -op Vital signs reviewed and stable ? ?Post vital signs: Reviewed and stable ? ?Last Vitals:  ?Vitals Value Taken Time  ?BP 125/74 05/14/21 1512  ?Temp 36.3 ?C 05/14/21 1512  ?Pulse 71 05/14/21 1514  ?Resp 22 05/14/21 1514  ?SpO2 100 % 05/14/21 1514  ?Vitals shown include unvalidated device data. ? ?Last Pain:  ?Vitals:  ? 05/14/21 1512  ?TempSrc: Temporal  ?PainSc:   ?   ? ?Patients Stated Pain Goal: 0 (05/13/21 1613) ? ?Complications: No notable events documented. ?

## 2021-05-14 NOTE — Discharge Summary (Signed)
?Physician Discharge Summary ?  ?Patient: Lonnie Hansen MRN: [id number] DOB: 1950/12/03  ?Admit date:     05/10/2021  ?Discharge date: 05/14/21  ?Discharge Physician: Marylu Lund  ? ?PCP: [name], MD  ? ?Recommendations at discharge:  ? ? Follow up with PCP in 1-2 weeks ? ?Discharge Diagnoses: ?Principal Problem: ?  Diverticulosis of intestine with bleeding ?Active Problems: ?  Acute blood loss anemia ?  Hyperlipidemia ?  Hypertension ?  GERD (gastroesophageal reflux disease) ?  Diverticulosis ?  Chronic ITP (idiopathic thrombocytopenia) (HCC) ?  S/P AVR ?  OSA on CPAP ? ?Resolved Problems: ?  * No resolved hospital problems. * ? ?Hospital Course: ?Lonnie Hansen is a 71 y.o. male with a history of AAA, AVR, chronic ITP, GERD, hyperlipidemia, OSA on CPAP, hypertension, GI bleeding. Patient reports black clots and red blood per rectum starting about two days prior with associated acute anemia concerning for active GI bleeding. GI consulted. CTA abdomen/pelvis significant for sigmoid colon bleed. GI recommending general surgery consult for consideration of left hemicolectomy. General surgery have deferred consideration of hemicolectomy at this time. GI bleeding appears to be improved. ? ?Assessment and Plan: ?* Diverticulosis of intestine with bleeding ?Normal BUN. Secondary to diverticular bleeding confirmed by bleeding vessel seen on CTA abdomen/pelvis ?-Seen by General Surgery with no recs for surgery at this time ?-Seen by GI, now s/p colonoscopy 4/19 with diverticula noted ? ?Acute blood loss anemia ?Secondary to GI bleeding. Baseline hemoglobin of about 12. Hemoglobin down to 7.7 on 4/17 with subsequent 1 unit of pRBC transfused. ?-Hgb has since remained stable post-transfusion ?-Pt underwent colonoscopy per GI on 4/19 with findings of diverticulosis with one polyp. Discussed with GI. OK to d/c today ?-Appreciate input by General Surgery. ? ?Chronic ITP (idiopathic thrombocytopenia) (HCC) ?Noted.  Platelets of 133 on admission and drifting down slightly. Patient follows with [name] as an outpatient. ? ?Diverticulosis ?Noted on history. Contributing to current presentation. ? ?GERD (gastroesophageal reflux disease) ?-Continue Protonix ? ?Hypertension ?Patient is on amlodipine and atenolol as an outpatient. Blood pressure stable ?-Cont home meds on d/c ? ?Hyperlipidemia ?-Continue Crestor ? ?OSA on CPAP ?-CPAP qhs ? ? ? ? ?  ? ? ?Consultants: General Surgery, GI ?Procedures performed: Colonoscopy 4/19  ?Disposition: Home ?Diet recommendation:  ?Regular diet ?DISCHARGE MEDICATION: ?Allergies as of 05/14/2021   ?No Known Allergies ?  ? ?  ?Medication List  ?  ? ?STOP taking these medications   ? ?traMADol 50 MG tablet ?Commonly known as: ULTRAM ?  ? ?  ? ?TAKE these medications   ? ?acetaminophen 500 MG tablet ?Commonly known as: TYLENOL ?Take 500-1,000 mg by mouth in the morning and at bedtime. Take 1 tablet in the morning and Take 2 tablets in the evening ?  ?acetaminophen 325 MG tablet ?Commonly known as: TYLENOL ?Take 325 mg by mouth every 6 (six) hours as needed for moderate pain. ?  ?acetaminophen 325 MG tablet ?Commonly known as: TYLENOL ?Take 2 tablets (650 mg total) by mouth every 6 (six) hours as needed for mild pain or fever. ?  ?amLODipine 10 MG tablet ?Commonly known as: NORVASC ?Take 10 mg by mouth every evening. ?  ?Anucort-HC 25 MG suppository ?Generic drug: hydrocortisone ?Place 1 suppository rectally daily as needed for pain. ?  ?atenolol 25 MG tablet ?Commonly known as: TENORMIN ?Take 12.5 mg by mouth 2 (two) times daily. ?  ?ferrous sulfate 325 (65 FE) MG tablet ?Take 325 mg by mouth  daily. ?  ?latanoprost 0.005 % ophthalmic solution ?Commonly known as: XALATAN ?Place 1 drop into both eyes 2 (two) times daily. ?  ?multivitamin with minerals Tabs tablet ?Take 1 tablet by mouth daily at 12 noon. ?  ?pantoprazole 40 MG tablet ?Commonly known as: PROTONIX ?Take 40 mg by mouth 2 (two) times  daily. ?  ?polyethylene glycol 17 g packet ?Commonly known as: MIRALAX / GLYCOLAX ?Take 17 g by mouth daily. ?What changed:  ?when to take this ?reasons to take this ?  ?psyllium 95 % Pack ?Commonly known as: HYDROCIL/METAMUCIL ?Take 1 packet by mouth daily. ?  ?rosuvastatin 10 MG tablet ?Commonly known as: CRESTOR ?Take 1 tablet by mouth once daily ?What changed: when to take this ?  ?triamcinolone cream 0.1 % ?Commonly known as: KENALOG ?Apply 1 application topically 2 (two) times daily as needed (facial itching/rash). ?  ? ?  ? ? Follow-up Information   ? ? [name], MD. Call in 2 week(s).   ?Specialty: Internal Medicine ?Why: Hospital follow up ?Contact information: ?[address] ?[place] [address] ?[phone] ? ? ?  ?  ? ? Martinique, [name], MD .   ?Specialty: Cardiology ?Contact information: ?[address] ?[address] ?[place] [address] ?[phone] ? ? ?  ?  ? ?  ?  ? ?  ? ?Discharge Exam: ?Filed Weights  ? 05/10/21 1243 05/10/21 1748 05/14/21 1320  ?Weight: 90.3 kg 90.3 kg 86.2 kg  ? ?General exam: Awake, laying in bed, in nad ?Respiratory system: Normal respiratory effort, no wheezing ?Cardiovascular system: regular rate, s1, s2 ?Gastrointestinal system: Soft, nondistended, positive BS ?Central nervous system: CN2-12 grossly intact, strength intact ?Extremities: Perfused, no clubbing ?Skin: Normal skin turgor, no notable skin lesions seen ?Psychiatry: Mood normal // no visual hallucinations  ? ? ?Condition at discharge: fair ? ?The results of significant diagnostics from this hospitalization (including imaging, microbiology, ancillary and laboratory) are listed below for reference.  ? ?Imaging Studies: ?CT Angio Abd/Pel w/ and/or w/o ? ?Result Date: 05/11/2021 ?CLINICAL DATA:  Evaluate for lower GI bleed. Patient reports black clots and red blood per rectum which began 2 days prior. Anemia. EXAM: CTA ABDOMEN AND PELVIS WITHOUT AND WITH CONTRAST TECHNIQUE: Multidetector CT imaging of the  abdomen and pelvis was performed using the standard protocol during bolus administration of intravenous contrast. Multiplanar reconstructed images and MIPs were obtained and reviewed to evaluate the vascular anatomy. RADIATION DOSE REDUCTION: This exam was performed according to the departmental dose-optimization program which includes automated exposure control, adjustment of the mA and/or kV according to patient size and/or use of iterative reconstruction technique. CONTRAST:  142m OMNIPAQUE IOHEXOL 350 MG/ML SOLN COMPARISON:  05/31/2020 FINDINGS: VASCULAR Aorta: Aneurysmal dilatation scratch set there is fusiform aneurysmal dilatation of the infrarenal abdominal aorta with maximum diameter of 4.9 cm, image 102/5. Formally this had a maximum diameter 4.8 cm. Wall adherent mural thrombus identified within the aneurysmal segment of the abdominal aorta. Celiac: Patent without evidence of aneurysm, dissection, vasculitis or significant stenosis. SMA: Patent without evidence of aneurysm, dissection, vasculitis or significant stenosis. Renals: Both renal arteries are patent without evidence of aneurysm, dissection, vasculitis, fibromuscular dysplasia or significant stenosis. IMA: Patent without evidence of aneurysm, dissection, vasculitis or significant stenosis. Inflow: Patent without evidence of aneurysm, dissection, vasculitis or significant stenosis. Proximal Outflow: Bilateral common femoral and visualized portions of the superficial and profunda femoral arteries are patent without evidence of aneurysm, dissection, vasculitis or significant stenosis. Veins: No obvious venous abnormality  within the limitations of this arterial phase study. Review of the MIP images confirms the above findings. NON-VASCULAR Lower chest: No acute abnormality. Hepatobiliary: 7 mm low-density structure within dome of left hepatic lobe is unchanged and remains too small to characterize. No suspicious liver abnormality. Status post  cholecystectomy. No bile duct dilatation. Pancreas: Unremarkable. No pancreatic ductal dilatation or surrounding inflammatory changes. Spleen: Normal in size without focal abnormality. Adrenals/Urinary Tract: Adr

## 2021-05-14 NOTE — Progress Notes (Signed)
MD order to discharge.  ? ?Discharge instructions reviewed with pt and his wife Lonnie Hansen.  ? ?Both verbalize understanding.  ? ?D/C via wheelchair accompanied by staff to car. ?

## 2021-05-15 ENCOUNTER — Other Ambulatory Visit: Payer: Self-pay | Admitting: *Deleted

## 2021-05-15 LAB — SURGICAL PATHOLOGY

## 2021-05-15 NOTE — Patient Outreach (Signed)
Lonnie Hansen) Care Management ? ?05/15/2021 ? ?[name] ?July 29, 1950 ?[id number] ? ? ?Notified by hospital liaison that member was admitted to hospital for rectal bleeding.  Denies any urgent concerns, encouraged to contact this care manager with questions.  Agrees to follow up within the next month. ? ?Care Plan : General Plan of Care (Adult)  ?Updates made by [name], RN since 05/15/2021 12:00 AM  ?  ? ?Problem: Knowledge deficit related to management of chronic medical conditions (ITP and HTN)   ?Priority: High  ?  ? ?Long-Range Goal: Patient will verbalize and display chronic health management evidenced by no hospital admissions in the next year (goal date extended, readmitted)   ?Start Date: 09/19/2020  ?Expected End Date: 09/19/2021  ?This Visit's Progress: Not on track  ?Recent Progress: On track  ?Priority: High  ?Note:   ?Current Barriers:  ?Knowledge Deficits related to plan of care for management of HTN, HLD, and ITP  ?Chronic Disease Management support and education needs related to HTN, HLD, and ITP ? ?RNCM Clinical Goal(s):  ?Patient will verbalize understanding of plan for management of HTN and ITP ?take all medications exactly as prescribed and will call provider for medication related questions ?attend all scheduled medical appointments: PCP, Cardiology, and Hematology ?continue to work with RN Care Manager to address care management and care coordination needs related to HTN and ITP  through collaboration with RN Care manager, provider, and care team.  ? ?Interventions: ?Inter-dis1:1 collaboration with primary care provider regarding development and update of comprehensive plan of care as evidenced by provider attestation and co-signatureciplinary care team collaboration (see longitudinal plan of care) ?Evaluation of current treatment plan related to  self management and patient's adherence to plan as established by provider ? ? ?Hypertension: (Status: Condition stable. Not  addressed this visit.) ?Last practice recorded BP readings:  ?BP Readings from Last 3 Encounters:  ?05/08/21 (!) 142/88  ?04/25/21 (!) 144/53  ?04/16/21 133/79  ?Most recent eGFR/CrCl: No results found for: EGFR  No components found for: CRCL ? ?Evaluation of current treatment plan related to hypertension self management and patient's adherence to plan as established by provider;   ?Reviewed prescribed diet low salt ?Advised patient, providing education and rationale, to monitor blood pressure daily and record, calling PCP for findings outside established parameters;  ?Discussed complications of poorly controlled blood pressure such as heart disease, stroke, circulatory complications, vision complications, kidney impairment, sexual dysfunction;  ?Screening for signs and symptoms of depression related to chronic disease state;  ? ?11/8 - Admits that he is not consistent with monitoring BP daily.  Strongly encouraged to do so and record readings, state he will try.   ? ? ?Hyperlipidemia Interventions:  (Status:  Goal on track:  Yes.) Long Term Goal ?Medication review performed; medication list updated in electronic medical record.  ?Provider established cholesterol goals reviewed ?Counseled on importance of regular laboratory monitoring as prescribed ?Provided HLD educational materials ? ?ITPGoal on track:  Yes. ?Evaluation of current treatment plan related to  ITP , self-management and patient's adherence to plan as established by provider. ?Discussed plans with patient for ongoing care management follow up and provided patient with direct contact information for care management team ?Advised patient to call provider with any signs of bleeding.  Platetlets in August were 99,000, on 11/3 decreased to 78,000; ?Provided education to patient re: management of ITP and chronic anemia; ?Reviewed medications with patient and discussed schedule of follow up, including next set of labs and  pending injection; ? ?11/8 - Member  state he has several follow up appointments in the next few weeks.  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, 12/5 with cardiology, and 1/3 with oncology for labs and office visit ? ? ?Update 12/5 - Was recently hospitalized for gall bladder issues, surgery to remove was completed.  State he is doing well, no complications post op.  Was seen by surgeon on 12/2, no follow up needed.  Will see hematology and cardiology today, last platelet count was 92 and stable. ? ?Patient Goals/Self-Care Activities: ?Patient will attend all scheduled provider appointments ?Patient will call pharmacy for medication refills ?Patient will call provider office for new concerns or questions ?  ? ? 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  Will have appointment with PCP next week, will contact this care manager with any concerns. ? ? ?Update 2/10 - Annual PCP visit completed on 1/24, member report only concern was elevated Triglycerides.  Educated on proper diet and exercise, verbalizes understanding.  Will follow up with hematology on 3/3, last platelet count was 97 in January, injection received.  He is hoping to be able to keep count greater than 100 with minimal injections.  Otherwise, no concerns voiced. ? ? ?Update 3/14 - Spoke with member, report he has adjusted diet to low cholesterol/heart healthy diet.  He received another injection a couple weeks ago for decreased platelet count of 76.  He is scheduled for repeat labs on 3/17 with injection.  This will be every 2 weeks to keep level stable.  He voices concern regarding known AAA.  Was last seen by vascular in April last year, size of AAA was 5 cm with potential plan for intervention if it became greater than 5.5 cm.  He has not had 6 month follow up as suggested, provided with contact  information to office and encouraged to call for appointment.  State he will do so today (update - done, test scheduled for 3/17, office visit on 3/22).  Denies any pain or discomfort at this time. ? ? ?Update 4/13 - Member was seen in ED today for rectal bleeding, left without being seen after waiting in lobby for 8 hours.  State he called his provider at [facility] and was prescribed a steroid suppository and given an appointment to come in the office in the next 2 weeks.  He is aware of the need to seek medical attention should bleeding get worse, will try urgent care or one of the [facility].  State his blood levels were "fine" in the ED.  Denies any dizziness or fatigue.   ? ?Cardiology appointment to assess AAA completed, decision per notes is to continue to monitor and re-assess in the next few months.   ? ? ?Update 4/20 - Was admitted to hospital 4/15-4/19 for bleeding diverticulosis, received 1 unit PRBC to keep hemoglobin greater than 8.  He has follow up with hematology office on 5/3.  Denies any current bleeding.  There was no surgical intervention at the time, report surgeon was fearful of complications due to his AAA.  He will follow up with cardiothoracic surgeon on 5/24 and will call vascular surgeon to schedule follow up.  He will also call PCP to advise of admission and schedule follow up.   ? ? ?  ? ?  [name], RN, MSN, CCM ?[facility] Care Management  ?Community Care Manager ?[phone] ? ?

## 2021-05-20 ENCOUNTER — Telehealth: Payer: Self-pay | Admitting: *Deleted

## 2021-05-20 NOTE — Telephone Encounter (Signed)
Called patient to reschedule appointment with Judson Roch - confirmed ?

## 2021-05-25 DIAGNOSIS — E785 Hyperlipidemia, unspecified: Secondary | ICD-10-CM | POA: Diagnosis not present

## 2021-05-25 DIAGNOSIS — I10 Essential (primary) hypertension: Secondary | ICD-10-CM | POA: Diagnosis not present

## 2021-05-25 DIAGNOSIS — I2510 Atherosclerotic heart disease of native coronary artery without angina pectoris: Secondary | ICD-10-CM | POA: Diagnosis not present

## 2021-05-26 ENCOUNTER — Other Ambulatory Visit: Payer: Self-pay

## 2021-05-26 ENCOUNTER — Encounter: Payer: Self-pay | Admitting: Hematology & Oncology

## 2021-05-26 ENCOUNTER — Inpatient Hospital Stay (HOSPITAL_COMMUNITY): Payer: Medicare Other

## 2021-05-26 ENCOUNTER — Inpatient Hospital Stay (HOSPITAL_COMMUNITY)
Admission: EM | Admit: 2021-05-26 | Discharge: 2021-05-30 | DRG: 357 | Disposition: A | Discharge status: Home / Self Care | Payer: Medicare Other | Attending: Student | Admitting: Student

## 2021-05-26 DIAGNOSIS — Z952 Presence of prosthetic heart valve: Secondary | ICD-10-CM | POA: Diagnosis not present

## 2021-05-26 DIAGNOSIS — R001 Bradycardia, unspecified: Secondary | ICD-10-CM | POA: Diagnosis present

## 2021-05-26 DIAGNOSIS — N179 Acute kidney failure, unspecified: Secondary | ICD-10-CM | POA: Diagnosis present

## 2021-05-26 DIAGNOSIS — G4733 Obstructive sleep apnea (adult) (pediatric): Secondary | ICD-10-CM | POA: Diagnosis not present

## 2021-05-26 DIAGNOSIS — K921 Melena: Secondary | ICD-10-CM | POA: Diagnosis not present

## 2021-05-26 DIAGNOSIS — E785 Hyperlipidemia, unspecified: Secondary | ICD-10-CM | POA: Diagnosis present

## 2021-05-26 DIAGNOSIS — K219 Gastro-esophageal reflux disease without esophagitis: Secondary | ICD-10-CM | POA: Diagnosis present

## 2021-05-26 DIAGNOSIS — D638 Anemia in other chronic diseases classified elsewhere: Secondary | ICD-10-CM | POA: Diagnosis present

## 2021-05-26 DIAGNOSIS — Z9049 Acquired absence of other specified parts of digestive tract: Secondary | ICD-10-CM

## 2021-05-26 DIAGNOSIS — Z91199 Patient's noncompliance with other medical treatment and regimen due to unspecified reason: Secondary | ICD-10-CM

## 2021-05-26 DIAGNOSIS — D693 Immune thrombocytopenic purpura: Secondary | ICD-10-CM

## 2021-05-26 DIAGNOSIS — K625 Hemorrhage of anus and rectum: Secondary | ICD-10-CM | POA: Diagnosis not present

## 2021-05-26 DIAGNOSIS — K5790 Diverticulosis of intestine, part unspecified, without perforation or abscess without bleeding: Secondary | ICD-10-CM | POA: Diagnosis present

## 2021-05-26 DIAGNOSIS — N400 Enlarged prostate without lower urinary tract symptoms: Secondary | ICD-10-CM | POA: Diagnosis present

## 2021-05-26 DIAGNOSIS — K5731 Diverticulosis of large intestine without perforation or abscess with bleeding: Secondary | ICD-10-CM | POA: Diagnosis not present

## 2021-05-26 DIAGNOSIS — Z8249 Family history of ischemic heart disease and other diseases of the circulatory system: Secondary | ICD-10-CM

## 2021-05-26 DIAGNOSIS — E669 Obesity, unspecified: Secondary | ICD-10-CM | POA: Diagnosis present

## 2021-05-26 DIAGNOSIS — I7143 Infrarenal abdominal aortic aneurysm, without rupture: Secondary | ICD-10-CM | POA: Diagnosis not present

## 2021-05-26 DIAGNOSIS — E876 Hypokalemia: Secondary | ICD-10-CM | POA: Diagnosis not present

## 2021-05-26 DIAGNOSIS — I10 Essential (primary) hypertension: Secondary | ICD-10-CM | POA: Diagnosis present

## 2021-05-26 DIAGNOSIS — J382 Nodules of vocal cords: Secondary | ICD-10-CM | POA: Diagnosis present

## 2021-05-26 DIAGNOSIS — I6523 Occlusion and stenosis of bilateral carotid arteries: Secondary | ICD-10-CM | POA: Diagnosis present

## 2021-05-26 DIAGNOSIS — Z8679 Personal history of other diseases of the circulatory system: Secondary | ICD-10-CM

## 2021-05-26 DIAGNOSIS — I469 Cardiac arrest, cause unspecified: Secondary | ICD-10-CM | POA: Diagnosis not present

## 2021-05-26 DIAGNOSIS — Z79899 Other long term (current) drug therapy: Secondary | ICD-10-CM | POA: Diagnosis not present

## 2021-05-26 DIAGNOSIS — K922 Gastrointestinal hemorrhage, unspecified: Secondary | ICD-10-CM | POA: Diagnosis not present

## 2021-05-26 DIAGNOSIS — H409 Unspecified glaucoma: Secondary | ICD-10-CM | POA: Diagnosis present

## 2021-05-26 DIAGNOSIS — Z862 Personal history of diseases of the blood and blood-forming organs and certain disorders involving the immune mechanism: Secondary | ICD-10-CM

## 2021-05-26 DIAGNOSIS — M47816 Spondylosis without myelopathy or radiculopathy, lumbar region: Secondary | ICD-10-CM | POA: Diagnosis not present

## 2021-05-26 DIAGNOSIS — K5730 Diverticulosis of large intestine without perforation or abscess without bleeding: Secondary | ICD-10-CM | POA: Diagnosis not present

## 2021-05-26 DIAGNOSIS — I2510 Atherosclerotic heart disease of native coronary artery without angina pectoris: Secondary | ICD-10-CM | POA: Diagnosis present

## 2021-05-26 DIAGNOSIS — Z87891 Personal history of nicotine dependence: Secondary | ICD-10-CM

## 2021-05-26 DIAGNOSIS — D62 Acute posthemorrhagic anemia: Secondary | ICD-10-CM

## 2021-05-26 DIAGNOSIS — Z9889 Other specified postprocedural states: Secondary | ICD-10-CM | POA: Diagnosis not present

## 2021-05-26 DIAGNOSIS — R58 Hemorrhage, not elsewhere classified: Secondary | ICD-10-CM | POA: Diagnosis not present

## 2021-05-26 DIAGNOSIS — Z6832 Body mass index (BMI) 32.0-32.9, adult: Secondary | ICD-10-CM

## 2021-05-26 DIAGNOSIS — Z9989 Dependence on other enabling machines and devices: Secondary | ICD-10-CM | POA: Diagnosis not present

## 2021-05-26 DIAGNOSIS — M1611 Unilateral primary osteoarthritis, right hip: Secondary | ICD-10-CM | POA: Diagnosis not present

## 2021-05-26 HISTORY — PX: IR ANGIOGRAM VISCERAL SELECTIVE: IMG657

## 2021-05-26 HISTORY — PX: IR US GUIDE VASC ACCESS RIGHT: IMG2390

## 2021-05-26 HISTORY — PX: IR ANGIOGRAM SELECTIVE EACH ADDITIONAL VESSEL: IMG667

## 2021-05-26 HISTORY — PX: IR US GUIDE VASC ACCESS LEFT: IMG2389

## 2021-05-26 HISTORY — PX: IR EMBO ART  VEN HEMORR LYMPH EXTRAV  INC GUIDE ROADMAPPING: IMG5450

## 2021-05-26 LAB — COMPREHENSIVE METABOLIC PANEL
ALT: 10 U/L (ref 0–44)
AST: 19 U/L (ref 15–41)
Albumin: 3.6 g/dL (ref 3.5–5.0)
Alkaline Phosphatase: 29 U/L — ABNORMAL LOW (ref 38–126)
Anion gap: 10 (ref 5–15)
BUN: 21 mg/dL (ref 8–23)
CO2: 21 mmol/L — ABNORMAL LOW (ref 22–32)
Calcium: 8.5 mg/dL — ABNORMAL LOW (ref 8.9–10.3)
Chloride: 110 mmol/L (ref 98–111)
Creatinine, Ser: 1.37 mg/dL — ABNORMAL HIGH (ref 0.61–1.24)
GFR, Estimated: 55 mL/min — ABNORMAL LOW (ref >60)
Glucose, Bld: 151 mg/dL — ABNORMAL HIGH (ref 70–99)
Potassium: 3.6 mmol/L (ref 3.5–5.1)
Sodium: 141 mmol/L (ref 135–145)
Total Bilirubin: 0.6 mg/dL (ref 0.3–1.2)
Total Protein: 5.8 g/dL — ABNORMAL LOW (ref 6.5–8.1)

## 2021-05-26 LAB — CBC WITH DIFFERENTIAL/PLATELET
Abs Immature Granulocytes: 0.05 10*3/uL (ref 0.00–0.07)
Abs Immature Granulocytes: 0.07 10*3/uL (ref 0.00–0.07)
Basophils Absolute: 0 10*3/uL (ref 0.0–0.1)
Basophils Absolute: 0 10*3/uL (ref 0.0–0.1)
Basophils Relative: 0 %
Basophils Relative: 0 %
Eosinophils Absolute: 0.1 10*3/uL (ref 0.0–0.5)
Eosinophils Absolute: 0 10*3/uL (ref 0.0–0.5)
Eosinophils Relative: 1 %
Eosinophils Relative: 0 %
HCT: 24.6 % — ABNORMAL LOW (ref 39.0–52.0)
HCT: 18.3 % — ABNORMAL LOW (ref 39.0–52.0)
Hemoglobin: 7.7 g/dL — ABNORMAL LOW (ref 13.0–17.0)
Hemoglobin: 6.1 g/dL — CL (ref 13.0–17.0)
Immature Granulocytes: 1 %
Immature Granulocytes: 1 %
Lymphocytes Relative: 15 %
Lymphocytes Relative: 9 %
Lymphs Abs: 1.2 10*3/uL (ref 0.7–4.0)
Lymphs Abs: 0.7 10*3/uL (ref 0.7–4.0)
MCH: 29.6 pg (ref 26.0–34.0)
MCH: 29.8 pg (ref 26.0–34.0)
MCHC: 31.3 g/dL (ref 30.0–36.0)
MCHC: 33.3 g/dL (ref 30.0–36.0)
MCV: 94.6 fL (ref 80.0–100.0)
MCV: 89.3 fL (ref 80.0–100.0)
Monocytes Absolute: 0.7 10*3/uL (ref 0.1–1.0)
Monocytes Absolute: 0.4 10*3/uL (ref 0.1–1.0)
Monocytes Relative: 9 %
Monocytes Relative: 5 %
Neutro Abs: 5.9 10*3/uL (ref 1.7–7.7)
Neutro Abs: 6.8 10*3/uL (ref 1.7–7.7)
Neutrophils Relative %: 74 %
Neutrophils Relative %: 85 %
Platelets: 76 10*3/uL — ABNORMAL LOW (ref 150–400)
Platelets: 44 10*3/uL — ABNORMAL LOW (ref 150–400)
RBC: 2.6 MIL/uL — ABNORMAL LOW (ref 4.22–5.81)
RBC: 2.05 MIL/uL — ABNORMAL LOW (ref 4.22–5.81)
RDW: 14.5 % (ref 11.5–15.5)
RDW: 16.1 % — ABNORMAL HIGH (ref 11.5–15.5)
WBC: 7.9 10*3/uL (ref 4.0–10.5)
WBC: 7.9 10*3/uL (ref 4.0–10.5)
nRBC: 0 % (ref 0.0–0.2)
nRBC: 0.3 % — ABNORMAL HIGH (ref 0.0–0.2)

## 2021-05-26 LAB — PROTIME-INR
INR: 1.2 (ref 0.8–1.2)
Prothrombin Time: 15.1 seconds (ref 11.4–15.2)

## 2021-05-26 LAB — I-STAT CHEM 8, ED
BUN: 23 mg/dL (ref 8–23)
Calcium, Ion: 1.08 mmol/L — ABNORMAL LOW (ref 1.15–1.40)
Chloride: 106 mmol/L (ref 98–111)
Creatinine, Ser: 1.3 mg/dL — ABNORMAL HIGH (ref 0.61–1.24)
Glucose, Bld: 154 mg/dL — ABNORMAL HIGH (ref 70–99)
HCT: 23 % — ABNORMAL LOW (ref 39.0–52.0)
Hemoglobin: 7.8 g/dL — ABNORMAL LOW (ref 13.0–17.0)
Potassium: 3.7 mmol/L (ref 3.5–5.1)
Sodium: 141 mmol/L (ref 135–145)
TCO2: 23 mmol/L (ref 22–32)

## 2021-05-26 LAB — PREPARE PLATELET PHERESIS: Unit division: 0

## 2021-05-26 LAB — HEMOGLOBIN AND HEMATOCRIT, BLOOD
HCT: 25.4 % — ABNORMAL LOW (ref 39.0–52.0)
HCT: 27.7 % — ABNORMAL LOW (ref 39.0–52.0)
Hemoglobin: 8.4 g/dL — ABNORMAL LOW (ref 13.0–17.0)
Hemoglobin: 9.4 g/dL — ABNORMAL LOW (ref 13.0–17.0)

## 2021-05-26 LAB — BPAM PLATELET PHERESIS
Blood Product Expiration Date: 202305012359
ISSUE DATE / TIME: 202305010718
Unit Type and Rh: 8400

## 2021-05-26 LAB — BLOOD PRODUCT ORDER (VERBAL) VERIFICATION

## 2021-05-26 LAB — PREPARE RBC (CROSSMATCH)

## 2021-05-26 LAB — MRSA NEXT GEN BY PCR, NASAL: MRSA by PCR Next Gen: NOT DETECTED

## 2021-05-26 LAB — GLUCOSE, CAPILLARY
Glucose-Capillary: 138 mg/dL — ABNORMAL HIGH (ref 70–99)
Glucose-Capillary: 162 mg/dL — ABNORMAL HIGH (ref 70–99)

## 2021-05-26 LAB — APTT: aPTT: 23 seconds — ABNORMAL LOW (ref 24–36)

## 2021-05-26 MED ORDER — ALTEPLASE 2 MG IJ SOLR
INTRAMUSCULAR | Status: AC | PRN
  Administered 2021-05-26: 2 mg

## 2021-05-26 MED ORDER — HEPARIN SODIUM (PORCINE) 1000 UNIT/ML IJ SOLN
INTRAMUSCULAR | Status: AC
  Filled 2021-05-26: qty 10

## 2021-05-26 MED ORDER — SODIUM CHLORIDE 0.9% IV SOLUTION: Freq: Once | INTRAVENOUS | Status: DC

## 2021-05-26 MED ORDER — SODIUM CHLORIDE 0.9 % IV SOLN: 10.0000 mL/h | Freq: Once | INTRAVENOUS | Status: DC

## 2021-05-26 MED ORDER — FENTANYL CITRATE (PF) 100 MCG/2ML IJ SOLN
INTRAMUSCULAR | Status: AC
  Filled 2021-05-26: qty 2

## 2021-05-26 MED ORDER — LIDOCAINE HCL 1 % IJ SOLN
INTRAMUSCULAR | Status: AC
  Filled 2021-05-26: qty 20

## 2021-05-26 MED ORDER — CHLORHEXIDINE GLUCONATE CLOTH 2 % EX PADS
6.0000 | MEDICATED_PAD | Freq: Every day | CUTANEOUS | Status: DC
  Administered 2021-05-27 – 2021-05-30 (×4): 6 via TOPICAL

## 2021-05-26 MED ORDER — CALCIUM GLUCONATE-NACL 1-0.675 GM/50ML-% IV SOLN
1.0000 g | Freq: Once | INTRAVENOUS | Status: AC
  Administered 2021-05-26: 1000 mg via INTRAVENOUS

## 2021-05-26 MED ORDER — IOHEXOL 300 MG/ML  SOLN
100.0000 mL | Freq: Once | INTRAMUSCULAR | Status: AC | PRN
  Administered 2021-05-26: 30 mL via INTRA_ARTERIAL

## 2021-05-26 MED ORDER — ONDANSETRON HCL 4 MG/2ML IJ SOLN
4.0000 mg | Freq: Three times a day (TID) | INTRAMUSCULAR | Status: DC | PRN
  Administered 2021-05-26 – 2021-05-27 (×2): 4 mg via INTRAVENOUS

## 2021-05-26 MED ORDER — FENTANYL CITRATE (PF) 100 MCG/2ML IJ SOLN
INTRAMUSCULAR | Status: AC | PRN
  Administered 2021-05-26: 25 ug via INTRAVENOUS

## 2021-05-26 MED ORDER — MIDAZOLAM HCL 2 MG/2ML IJ SOLN
INTRAMUSCULAR | Status: AC | PRN
  Administered 2021-05-26: 1 mg via INTRAVENOUS

## 2021-05-26 MED ORDER — CALCIUM GLUCONATE-NACL 1-0.675 GM/50ML-% IV SOLN
1.0000 g | Freq: Once | INTRAVENOUS | Status: AC
  Administered 2021-05-26: 1000 mg via INTRAVENOUS
  Filled 2021-05-26: qty 50

## 2021-05-26 MED ORDER — PANTOPRAZOLE SODIUM 40 MG IV SOLR
40.0000 mg | INTRAVENOUS | Status: DC
  Administered 2021-05-26 – 2021-05-27 (×2): 40 mg via INTRAVENOUS

## 2021-05-26 MED ORDER — ONDANSETRON HCL 4 MG/2ML IJ SOLN: 4.0000 mg | Freq: Three times a day (TID) | INTRAMUSCULAR | Status: DC | PRN

## 2021-05-26 MED ORDER — MIDAZOLAM HCL 2 MG/2ML IJ SOLN
INTRAMUSCULAR | Status: AC
  Filled 2021-05-26: qty 2

## 2021-05-26 MED ORDER — ALTEPLASE 2 MG IJ SOLR
INTRAMUSCULAR | Status: AC
  Filled 2021-05-26: qty 2

## 2021-05-26 NOTE — ED Notes (Signed)
This RN heard patient asking for help. Upon entering patients room pt was diaphoretic and unresponsive with HR of 42. Palpated patients radial pulse. Notified MD Messick.  ?

## 2021-05-26 NOTE — ED Provider Notes (Signed)
?[facility] ?Provider Note ? ? ?CSN: [id number] ?Arrival date & time:    ? ?  ? ?History ? ?Chief Complaint  ?Patient presents with  ? Blood In Stools  ?  Reports bright red blood in stool that began around 0530. Hx of gi bleed  ? ? ?Lonnie Hansen is a 71 y.o. male. ? 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  Patient states that he awoke at around 5:30 AM and had a bowel movement that contained a large amount of bright red blood.  Patient states that he feels like his hemoglobin is low and thinks that he will require blood transfusion.  He does not feel short of breath but felt lightheaded with standing and dizzy earlier this morning.  No vomiting of blood.  He is not on anticoagulation.  No abdominal pain. ? ? ?  ? ?Home Medications ?Prior to Admission medications   ?Medication Sig Start Date End Date Taking? Authorizing Provider  ?acetaminophen (TYLENOL) 325 MG tablet Take 2 tablets (650 mg total) by mouth every 6 (six) hours as needed for mild pain or fever. ?Patient not taking: Reported on 05/10/2021 05/29/20   Estill Cotta, NP  ?acetaminophen (TYLENOL) 325 MG tablet Take 325 mg by mouth every 6 (six) hours as needed for moderate pain.    [provider]  ?acetaminophen (TYLENOL) 500 MG tablet Take 500-1,000 mg by mouth in the morning and at bedtime. Take 1 tablet in the morning and Take 2 tablets in the evening    [provider]  ?amLODipine (NORVASC) 10 MG tablet Take 10 mg by mouth every evening.    [provider]  ?ANUCORT-HC 25 MG suppository Place 1 suppository rectally daily as needed for pain. 04/29/21   [provider]  ?atenolol (TENORMIN) 25 MG  tablet Take 12.5 mg by mouth 2 (two) times daily. 08/26/20   [name], MD  ?ferrous sulfate 325 (65 FE) MG tablet Take 325 mg by mouth daily.    [provider]  ?latanoprost (XALATAN) 0.005 % ophthalmic solution Place 1 drop into both eyes 2 (two) times daily.    [provider]  ?Multiple Vitamin (MULTIVITAMIN WITH MINERALS) TABS tablet Take 1 tablet by mouth daily at 12 noon.    [provider]  ?pantoprazole (PROTONIX) 40 MG tablet Take 40 mg by mouth 2 (two) times daily. 11/09/20   [provider]  ?polyethylene glycol (MIRALAX / GLYCOLAX) 17 g packet Take 17 g by mouth daily. ?Patient taking differently: Take 17 g by mouth daily as needed for moderate constipation. 06/04/20   [name], MD  ?psyllium (HYDROCIL/METAMUCIL) 95 % PACK Take 1 packet by mouth daily. ?Patient not taking: Reported on 05/10/2021 06/04/20   [name], MD  ?rosuvastatin (CRESTOR) 10 MG tablet Take 1 tablet by mouth once daily ?Patient taking differently: Take 10 mg by mouth every evening. 05/17/20   Martinique, [name], MD  ?triamcinolone cream (KENALOG) 0.1 % Apply 1 application topically 2 (two) times daily as needed (facial itching/rash). 05/16/20   [provider]  ?   ? ?Allergies    ?Patient has no known allergies.   ? ?Review of Systems   ?Review of Systems ? ?Physical Exam ?Updated Vital Signs ?BP (!) 141/83 (  BP Location: Right Arm)   Pulse 72   Temp 97.9 ?F (36.6 ?C) (Oral)   Resp 16   Ht '5\' 6"'$  (1.676 m)   Wt 86.2 kg   SpO2 100%   BMI 30.67 kg/m?  ? ?Physical Exam ?Vitals and nursing note reviewed.  ?Constitutional:   ?   General: He is not in acute distress. ?   Appearance: He is well-developed.  ?HENT:  ?   Head: Normocephalic and atraumatic.  ?Eyes:  ?   General:     ?   Right eye: No discharge.     ?   Left eye: No discharge.  ?   Conjunctiva/sclera: Conjunctivae normal.  ?Cardiovascular:  ?   Rate and Rhythm: Normal rate and regular rhythm.  ?   Heart sounds: Normal  heart sounds.  ?Pulmonary:  ?   Effort: Pulmonary effort is normal.  ?   Breath sounds: Normal breath sounds.  ?Abdominal:  ?   Palpations: Abdomen is soft.  ?   Tenderness: There is no abdominal tenderness. There is no guarding or rebound.  ?Musculoskeletal:  ?   Cervical back: Normal range of motion and neck supple.  ?Skin: ?   General: Skin is warm and dry.  ?   Coloration: Skin is pale.  ?Neurological:  ?   Mental Status: He is alert.  ? ? ?ED Results / Procedures / Treatments   ?Labs ?(all labs ordered are listed, but only abnormal results are displayed) ?Labs Reviewed  ?COMPREHENSIVE METABOLIC PANEL - Abnormal; Notable for the following components:  ?    Result Value  ? CO2 21 (*)   ? Glucose, Bld 151 (*)   ? Creatinine, Ser 1.37 (*)   ? Calcium 8.5 (*)   ? Total Protein 5.8 (*)   ? Alkaline Phosphatase 29 (*)   ? GFR, Estimated 55 (*)   ? All other components within normal limits  ?CBC WITH DIFFERENTIAL/PLATELET - Abnormal; Notable for the following components:  ? RBC 2.60 (*)   ? Hemoglobin 7.7 (*)   ? HCT 24.6 (*)   ? Platelets 76 (*)   ? All other components within normal limits  ?APTT - Abnormal; Notable for the following components:  ? aPTT 23 (*)   ? All other components within normal limits  ?I-STAT CHEM 8, ED - Abnormal; Notable for the following components:  ? Creatinine, Ser 1.30 (*)   ? Glucose, Bld 154 (*)   ? Calcium, Ion 1.08 (*)   ? Hemoglobin 7.8 (*)   ? HCT 23.0 (*)   ? All other components within normal limits  ?PROTIME-INR  ?HEMOGLOBIN AND HEMATOCRIT, BLOOD  ?HEMOGLOBIN AND HEMATOCRIT, BLOOD  ?HEMOGLOBIN AND HEMATOCRIT, BLOOD  ?HEMOGLOBIN AND HEMATOCRIT, BLOOD  ?TYPE AND SCREEN  ?PREPARE RBC (CROSSMATCH)  ?PREPARE PLATELET PHERESIS  ?PREPARE RBC (CROSSMATCH)  ?PREPARE RBC (CROSSMATCH)  ?PREPARE FRESH FROZEN PLASMA  ? ? ?EKG ?EKG Interpretation ? ?Date/Time:  Monday May 26 2021 06:47:02 EDT ?Ventricular Rate:  77 ?PR Interval:  178 ?QRS Duration: 100 ?QT Interval:  406 ?QTC  Calculation: 460 ?R Axis:   37 ?Text Interpretation: Sinus rhythm Abnormal R-wave progression, early transition Confirmed by Dene Gentry [phone]) on 05/26/2021 7:04:58 AM ? ?Radiology ?No results found. ? ?Procedures ?Procedures  ? ? ?Medications Ordered in ED ?Medications  ?0.9 %  sodium chloride infusion (0 mL/hr Intravenous Hold 05/26/21 0743)  ?pantoprazole (PROTONIX) injection 40 mg (has no administration in time range)  ?0.9 %  sodium chloride  infusion (Manually program via Guardrails IV Fluids) (has no administration in time range)  ?0.9 %  sodium chloride infusion (has no administration in time range)  ? ? ?ED Course/ Medical Decision Making/ A&P ?  ? ?Patient seen and examined. History obtained directly from patient.  Also reviewed previous hospitalization and GI notes. ? ?Labs/EKG: Ordered CBC, CMP, type and screen, EKG. ? ?Imaging: None ordered ? ?Medications/Fluids: No anticoagulation that would need reversal.  Awaiting hemoglobin and platelet count to determine possible need for blood products although patient appears stable at this time. ? ?Most recent vital signs reviewed and are as follows: ?BP (!) 141/83 (BP Location: Right Arm)   Pulse 72   Temp 97.9 ?F (36.6 ?C) (Oral)   Resp 16   Ht '5\' 6"'$  (1.676 m)   Wt 86.2 kg   SpO2 100%   BMI 30.67 kg/m?  ? ?Initial impression: Likely recurrence of diverticular bleeding based on past history. ? ?Patient discussed with Dr. Francia Greaves who is aware and will see patient. ? ?7:00 AM istat hgb 7.8, actively bleeding. 2u prbc ordered.  ? ?7:13 AM Called in for near syncope spell, continuing to bleed. Asked for 1 u emergency release prbc. Will discuss with GI.  ? ?7:21 AM Pt reevaluated. Blood not yet started but coming. BP stable. Continues to have bleeding. Awaiting callback from [facility] GI.  ? ?7:44 AM Pt stable. Receiving 1st unit of blood. Wife now at bedside. Confirmed history. States large amount of blood in car.  ? ?7:57 AM Consulted with Dr. Michail Sermon by  telephone. Reccs IR involvement. Consult placed.  ? ?8:13 AM Consulted with Dr. Jacqualyn Posey with IR. They will consult.  Feels no utility in repeating CT angio today, but if able to get nuclear medicine GI bleed study, this may be helpful to c

## 2021-05-26 NOTE — Sedation Documentation (Signed)
Pt transported to 3M11 accompanied by IR RNs. Gabe at bedside to receive pt. Handoff complete. Right groin remains level 0 and bilateral lower distal pulses palpable. Pt awake alert and oriented and responding appropriately. Pt shivering but otherwise no s/s of distress. VSS.   ?

## 2021-05-26 NOTE — ED Notes (Signed)
Pt continues to have large bloody stools. MD aware. Cleaned pt and linens. ?

## 2021-05-26 NOTE — Consult Note (Signed)
? ? ? ? ?Consult Note ? ?[name] ?May 21, 1950  ?[id number].   ? ?Requesting MD: Jacky Kindle, MD ?Chief Complaint/Reason for Consult: Lower GI bleeding, recurrent and diverticular ?HPI:  ?Patient is a 71 year old male who presented to the ED for recurrent lower GI bleeding that started around 0530 this AM. Patient reported severe lower abdominal cramping and BRBPR associated with dizziness and near syncope. His wife started to drive him to the ED but given bleeding then stopped and called EMS. He had previously been on ASA but it seems had stopped this after last admission. Recent admission 4/15-4/19 for the same, did not undergo any intervention during that admission but bleeding stopped and hgb stabilized. Surgery was consulted for consideration of left hemicolectomy that admission as well.  ? 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 Prior abdominal surgery includes laparoscopic cholecystectomy in November 2022. NKDA.  ? ?ROS: ?Review of Systems  ?Gastrointestinal:  Positive for abdominal pain and blood in stool.  ? ?Family History  ?Problem Relation Age of Onset  ? Heart disease Father   ? Heart attack Father   ? Thyroid disease Sister   ? ? ?Past Medical History:  ?Diagnosis Date  ? AAA (abdominal aortic aneurysm) ([place])   ? Anemia   ? Arthritis   ? Asymptomatic bilateral carotid artery stenosis 08/2015  ? 1-39%   ? Cataracts, bilateral   ? 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  ? Coronary artery disease   ? Diverticulosis   ? Enlarged aorta ([facility])   ? Enlarged prostate   ? slightly  ? GERD (gastroesophageal reflux disease)   ? takes Pantoprazole daily as needed  ? Glaucoma   ? uses eye drops daily  ? Headache   ? History of colon polyps   ? benign  ? History of kidney stones   ? Hyperlipidemia   ? no on any meds  ? Hypertension   ?  takes Amlodipine and Atenolol daily  ? OSA on CPAP   ? Vocal cord nodule   ? pt. states  it's a" growth on vocal cord"  ? ? ?Past Surgical History:  ?Procedure Laterality Date  ? AORTIC ARCH ANGIOGRAPHY N/A 04/16/2016  ? Procedure: Aortic Arch Angiography;  Surgeon: [name] Martinique, MD;  Location: [facility];  Service: Cardiovascular;  Laterality: N/A;  ? AORTIC VALVE REPLACEMENT N/A 09/15/2016  ? Procedure: AORTIC VALVE REPLACEMENT (AVR);  Surgeon: Grace Isaac, MD;  Location: [facility] [facility];  Service: Open Heart Surgery;  Laterality: N/A;  Using 12m Edwards Perimount Magna Ease Aortic Bioprosthesis Valve  ? ASCENDING AORTIC ROOT REPLACEMENT N/A 09/15/2016  ? Procedure: ASCENDING AORTIC ROOT REPLACEMENT;  Surgeon: GGrace Isaac MD;  [facility]  Service: Open Heart Surgery;  Laterality: N/A;  Using 33mGelweave Valsalva Graft  ? CHOLECYSTECTOMY N/A 12/19/2020  ? Procedure: LAPAROSCOPIC CHOLECYSTECTOMY WITH POSSIBLE INTRAOPERATIVE CHOLANGIOGRAM;  Surgeon: WiGreer PickerelMD;  [facility] Service: General;  Laterality: N/A;  ? COLONOSCOPY    ? COLONOSCOPY Left 04/16/2019  ? Procedure: COLONOSCOPY;  Surgeon: OuArta SilenceMD;  [facility] Service: Endoscopy;  Laterality: Left;  ? COLONOSCOPY N/A 06/01/2020  ? Procedure: COLONOSCOPY;  Surgeon: KaRonnette JuniperMD;  [facility] Service: Gastroenterology;  Laterality: N/A;  ? COLONOSCOPY WITH ESOPHAGOGASTRODUODENOSCOPY (EGD)    ?  COLONOSCOPY WITH PROPOFOL N/A 05/14/2021  ? Procedure: COLONOSCOPY WITH PROPOFOL;  Surgeon: Ronnette Juniper, MD;  [facility];  Service: Gastroenterology;  Laterality: N/A;  ? ESOPHAGOGASTRODUODENOSCOPY N/A 05/25/2020  ? Procedure: ESOPHAGOGASTRODUODENOSCOPY (EGD);  Surgeon: Wilford Corner, MD;  Location: [place];  Service: Endoscopy;  Laterality: N/A;  ? ESOPHAGOGASTRODUODENOSCOPY (EGD) WITH PROPOFOL Left 04/16/2019  ? Procedure: ESOPHAGOGASTRODUODENOSCOPY (EGD) WITH PROPOFOL;  Surgeon:  Arta Silence, MD;  Location[facility];  Service: Endoscopy;  Laterality: Left;  ? FLEXIBLE SIGMOIDOSCOPY N/A 05/25/2020  ? Procedure: FLEXIBLE SIGMOIDOSCOPY;  Surgeon: Wilford Corner, MD;  [facility];  Service: Endoscopy;  Laterality: N/A;  ? IR THORACENTESIS ASP PLEURAL SPACE W/IMG GUIDE  10/23/2016  ? MICROLARYNGOSCOPY Right 09/20/2017  ? Procedure: MICROLARYNGOSCOPY WITH  EXCISION OF VOCAL CORD LESION;  Surgeon: Leta Baptist, MD;  Location: [place];  Service: ENT;  Laterality: Right;  ? MULTIPLE EXTRACTIONS WITH ALVEOLOPLASTY N/A 06/10/2016  ? Procedure: Extraction of tooth #'s 2,8,13,15, and 29  with alveoloplasty, maxillary right and left buccal exostoses reductions, and gross debridement of remaining teeth.;  Surgeon: Lenn Cal, DDS;  Location: [place];  Service: Oral Surgery;  Laterality: N/A;  ? POLYPECTOMY  05/14/2021  ? Procedure: POLYPECTOMY;  Surgeon: Ronnette Juniper, MD;  Location: [facility];  Service: Gastroenterology;;  ? RIGHT/LEFT HEART CATH AND CORONARY ANGIOGRAPHY N/A 04/16/2016  ? Procedure: Right/Left Heart Cath and Coronary Angiography;  Surgeon: [name] Martinique, MD;  Location: [place] [facility];  Service: Cardiovascular;  Laterality: N/A;  ? TEE WITHOUT CARDIOVERSION N/A 09/15/2016  ? Procedure: TRANSESOPHAGEAL ECHOCARDIOGRAM (TEE);  Surgeon: Grace Isaac, MD;  Location: [place];  Service: Open Heart Surgery;  Laterality: N/A;  ? ? ?Social History:  reports that he has quit smoking. His smoking use included cigarettes. He has a 25.00 pack-year smoking history. He has never used smokeless tobacco. He reports that he does not drink alcohol and does not use drugs. ? ?Allergies: No Known Allergies ? ?(Not in a hospital admission) ? ? ?Blood pressure 130/83, pulse 67, temperature 97.8 ?F (36.6 ?C), temperature source Oral, resp. rate 12, height '5\' 6"'$  (1.676 m), weight 86.2 kg, SpO2 100 %. ?Physical Exam:  ?General: pleasant, ederly male who is laying in bed in  NAD ?HEENT: head is normocephalic, atraumatic.  Sclera are noninjected.  Pupils equal and round.  Ears and nose without any masses or lesions.  Mouth is pink and moist ?Heart: regular, rate, and rhythm.  Normal s1,s2. No obvious murmurs, gallops, or rubs noted.  Palpable radial and pedal pulses bilaterally ?Lungs: CTAB, no wheezes, rhonchi, or rales noted.  Respiratory effort nonlabored ?Abd: soft, NT, ND, +BS, no masses, hernias, or organomegaly ?MS: all 4 extremities are symmetrical with no cyanosis, clubbing, or edema. ?Skin: warm and dry with no masses, lesions, or rashes ?Neuro: MAEs ?Psych: A&Ox3 with an appropriate affect. ? ? ?Results for orders placed or performed during the hospital encounter of 05/26/21 (from the past 48 hour(s))  ?Comprehensive metabolic panel     Status: Abnormal  ? Collection Time: 05/26/21  6:50 AM  ?Result Value Ref Range  ? Sodium 141 135 - 145 mmol/L  ? Potassium 3.6 3.5 - 5.1 mmol/L  ? Chloride 110 98 - 111 mmol/L  ? CO2 21 (L) 22 - 32 mmol/L  ? Glucose, Bld 151 (H) 70 - 99 mg/dL  ?  Comment: Glucose reference range applies only to samples taken after fasting for at least 8 hours.  ? BUN  21 8 - 23 mg/dL  ? Creatinine, Ser 1.37 (H) 0.61 - 1.24 mg/dL  ? Calcium 8.5 (L) 8.9 - 10.3 mg/dL  ? Total Protein 5.8 (L) 6.5 - 8.1 g/dL  ? Albumin 3.6 3.5 - 5.0 g/dL  ? AST 19 15 - 41 U/L  ? ALT 10 0 - 44 U/L  ? Alkaline Phosphatase 29 (L) 38 - 126 U/L  ? Total Bilirubin 0.6 0.3 - 1.2 mg/dL  ? GFR, Estimated 55 (L) >60 mL/min  ?  Comment: (NOTE) ?Calculated using the CKD-EPI Creatinine Equation (2021) ?  ? Anion gap 10 5 - 15  ?  Comment: Performed at [facility], [place] [address], [place], [place] [address]  ?CBC with Differential     Status: Abnormal  ? Collection Time: 05/26/21  6:50 AM  ?Result Value Ref Range  ? WBC 7.9 4.0 - 10.5 K/uL  ? RBC 2.60 (L) 4.22 - 5.81 MIL/uL  ? Hemoglobin 7.7 (L) 13.0 - 17.0 g/dL  ? HCT 24.6 (L) 39.0 - 52.0 %  ? MCV 94.6 80.0 - 100.0 fL  ? MCH 29.6 26.0 -  34.0 pg  ? MCHC 31.3 30.0 - 36.0 g/dL  ? RDW 14.5 11.5 - 15.5 %  ? Platelets 76 (L) 150 - 400 K/uL  ?  Comment: Immature Platelet Fraction may be ?clinically indicated, consider ?ordering this additional test

## 2021-05-26 NOTE — Consult Note (Signed)
Referring Provider: Dr. Tacy Learn ?Primary Care Physician:  [name], MD ?Primary Gastroenterologist:  [name] ? ?Reason for Consultation:  GI bleed ? ?HPI: Lonnie Hansen is a 71 y.o. male with a history of diverticular bleeding and was admitted 2 weeks ago with rectal bleeding that was thought to be diverticular in origin. CT angiogram was showed a small focus of active bleeding in the mid-sigmoid colon. Colonoscopy (05/14/21) by Dr. Therisa Doyne showed scattered left-sided diverticulosis and a small hyperplastic polyp was removed. This morning around 530AM he woke up and had a large amount of bright red blood per rectum that has recurred multiple times in the ER. Felt dizzy and lightheaded during his ride with EMS. Denies abdominal pain/N/V. Hgb 7.7 (9.3 on 05/14/21). ? ?Past Medical History:  ?Diagnosis Date  ? AAA (abdominal aortic aneurysm) ([place])   ? Anemia   ? Arthritis   ? Asymptomatic bilateral carotid artery stenosis 08/2015  ? 1-39%   ? Cataracts, bilateral   ? 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  ? Coronary artery disease   ? Diverticulosis   ? Enlarged aorta ([facility])   ? Enlarged prostate   ? slightly  ? GERD (gastroesophageal reflux disease)   ? takes Pantoprazole daily as needed  ? Glaucoma   ? uses eye drops daily  ? Headache   ? History of colon polyps   ? benign  ? History of kidney stones   ? Hyperlipidemia   ? no on any meds  ? Hypertension   ? takes Amlodipine and Atenolol daily  ? OSA on CPAP   ? Vocal cord nodule   ? pt. states  it's a" growth on vocal cord"  ? ? ?Past Surgical History:  ?Procedure Laterality Date  ? AORTIC ARCH ANGIOGRAPHY N/A 04/16/2016  ? Procedure: Aortic Arch Angiography;  Surgeon: [name] Martinique, MD;  Location: [facility];  Service: Cardiovascular;  Laterality: N/A;  ? AORTIC VALVE REPLACEMENT N/A 09/15/2016  ? Procedure: AORTIC VALVE REPLACEMENT (AVR);  Surgeon: Grace Isaac, MD;  Location: [facility];  Service: Open Heart Surgery;  Laterality:  N/A;  Using 48m Edwards Perimount Magna Ease Aortic Bioprosthesis Valve  ? ASCENDING AORTIC ROOT REPLACEMENT N/A 09/15/2016  ? Procedure: ASCENDING AORTIC ROOT REPLACEMENT;  Surgeon: GGrace Isaac MD;  Location: [facility]  Service: Open Heart Surgery;  Laterality: N/A;  Using 344mGelweave Valsalva Graft  ? CHOLECYSTECTOMY N/A 12/19/2020  ? Procedure: LAPAROSCOPIC CHOLECYSTECTOMY WITH POSSIBLE INTRAOPERATIVE CHOLANGIOGRAM;  Surgeon: WiGreer PickerelMD;  [facility] Service: General;  Laterality: N/A;  ? COLONOSCOPY    ? COLONOSCOPY Left 04/16/2019  ? Procedure: COLONOSCOPY;  Surgeon: OuArta SilenceMD;  [facility] Service: Endoscopy;  Laterality: Left;  ? COLONOSCOPY N/A 06/01/2020  ? Procedure: COLONOSCOPY;  Surgeon: KaRonnette JuniperMD;  Location: [facility] Service: Gastroenterology;  Laterality: N/A;  ? COLONOSCOPY WITH ESOPHAGOGASTRODUODENOSCOPY (EGD)    ? COLONOSCOPY WITH PROPOFOL N/A 05/14/2021  ? Procedure: COLONOSCOPY WITH PROPOFOL;  Surgeon: KaRonnette JuniperMD;  [facility];  Service: Gastroenterology;  Laterality: N/A;  ? ESOPHAGOGASTRODUODENOSCOPY N/A 05/25/2020  ? Procedure: ESOPHAGOGASTRODUODENOSCOPY (EGD);  Surgeon: ScWilford CornerMD;  [facility]: [facility] Service: Endoscopy;  Laterality: N/A;  ? ESOPHAGOGASTRODUODENOSCOPY (EGD) WITH PROPOFOL Left 04/16/2019  ? Procedure: ESOPHAGOGASTRODUODENOSCOPY (EGD) WITH PROPOFOL;  Surgeon: OuArta SilenceMD;  [facility];  Service: Endoscopy;  Laterality: Left;  ? FLEXIBLE SIGMOIDOSCOPY N/A 05/25/2020  ? Procedure: FLEXIBLE SIGMOIDOSCOPY;  Surgeon:  Wilford Corner, MD;  Location: [place];  Service: Endoscopy;  Laterality: N/A;  ? IR THORACENTESIS ASP PLEURAL SPACE W/IMG GUIDE  10/23/2016  ? MICROLARYNGOSCOPY Right 09/20/2017  ? Procedure: MICROLARYNGOSCOPY WITH  EXCISION OF VOCAL CORD LESION;  Surgeon: Leta Baptist, MD;  Location: [place];  Service: ENT;  Laterality: Right;  ? MULTIPLE EXTRACTIONS  WITH ALVEOLOPLASTY N/A 06/10/2016  ? Procedure: Extraction of tooth #'s 2,8,13,15, and 29  with alveoloplasty, maxillary right and left buccal exostoses reductions, and gross debridement of remaining teeth.;  Surgeon: Lenn Cal, DDS;  Location: [place];  Service: Oral Surgery;  Laterality: N/A;  ? POLYPECTOMY  05/14/2021  ? Procedure: POLYPECTOMY;  Surgeon: Ronnette Juniper, MD;  Location[facility];  Service: Gastroenterology;;  ? RIGHT/LEFT HEART CATH AND CORONARY ANGIOGRAPHY N/A 04/16/2016  ? Procedure: Right/Left Heart Cath and Coronary Angiography;  Surgeon: [name] Martinique, MD;  Location: [place] [facility];  Service: Cardiovascular;  Laterality: N/A;  ? TEE WITHOUT CARDIOVERSION N/A 09/15/2016  ? Procedure: TRANSESOPHAGEAL ECHOCARDIOGRAM (TEE);  Surgeon: Grace Isaac, MD;  Location: [place];  Service: Open Heart Surgery;  Laterality: N/A;  ? ? ?Prior to Admission medications   ?Medication Sig Start Date End Date Taking? Authorizing Provider  ?acetaminophen (TYLENOL) 325 MG tablet Take 2 tablets (650 mg total) by mouth every 6 (six) hours as needed for mild pain or fever. ?Patient not taking: Reported on 05/10/2021 05/29/20   Estill Cotta, NP  ?acetaminophen (TYLENOL) 325 MG tablet Take 325 mg by mouth every 6 (six) hours as needed for moderate pain.    [provider]  ?acetaminophen (TYLENOL) 500 MG tablet Take 500-1,000 mg by mouth in the morning and at bedtime. Take 1 tablet in the morning and Take 2 tablets in the evening    [provider]  ?amLODipine (NORVASC) 10 MG tablet Take 10 mg by mouth every evening.    [provider]  ?ANUCORT-HC 25 MG suppository Place 1 suppository rectally daily as needed for pain. 04/29/21   [provider]  ?atenolol (TENORMIN) 25 MG tablet Take 12.5 mg by mouth 2 (two) times daily. 08/26/20   [name], MD  ?ferrous sulfate 325 (65 FE) MG tablet Take 325 mg by mouth daily.    [provider]  ?latanoprost (XALATAN)  0.005 % ophthalmic solution Place 1 drop into both eyes 2 (two) times daily.    [provider]  ?Multiple Vitamin (MULTIVITAMIN WITH MINERALS) TABS tablet Take 1 tablet by mouth daily at 12 noon.    [provider]  ?pantoprazole (PROTONIX) 40 MG tablet Take 40 mg by mouth 2 (two) times daily. 11/09/20   [provider]  ?polyethylene glycol (MIRALAX / GLYCOLAX) 17 g packet Take 17 g by mouth daily. ?Patient taking differently: Take 17 g by mouth daily as needed for moderate constipation. 06/04/20   [name], MD  ?psyllium (HYDROCIL/METAMUCIL) 95 % PACK Take 1 packet by mouth daily. ?Patient not taking: Reported on 05/10/2021 06/04/20   [name], MD  ?rosuvastatin (CRESTOR) 10 MG tablet Take 1 tablet by mouth once daily ?Patient taking differently: Take 10 mg by mouth every evening. 05/17/20   Martinique, [name], MD  ?triamcinolone cream (KENALOG) 0.1 % Apply 1 application topically 2 (two) times daily as needed (facial itching/rash). 05/16/20   [provider]  ? ? ?Scheduled Meds: ? sodium chloride   Intravenous Once  ? pantoprazole (PROTONIX) IV  40 mg Intravenous Q24H  ? ?  Continuous Infusions: ? sodium chloride Stopped (05/26/21 0743)  ? sodium chloride Stopped (05/26/21 0936)  ? calcium gluconate 1,000 mg (05/26/21 0943)  ? ?PRN Meds:. ? ?Allergies as of 05/26/2021  ? (No Known Allergies)  ? ? ?Family History  ?Problem Relation Age of Onset  ? Heart disease Father   ? Heart attack Father   ? Thyroid disease Sister   ? ? ?Social History  ? ?Socioeconomic History  ? Marital status: Married  ?  Spouse name: Not on file  ? Number of children: 1  ? Years of education: Not on file  ? Highest education level: Not on file  ?Occupational History  ? Occupation: Musician  ?Tobacco Use  ? Smoking status: Former  ?  Packs/day: 1.00  ?  Years: 25.00  ?  Pack years: 25.00  ?  Types: Cigarettes  ? Smokeless tobacco: Never  ?Vaping Use  ? Vaping Use: Never used  ?Substance and Sexual  Activity  ? Alcohol use: No  ?  Alcohol/week: 0.0 standard drinks  ? Drug use: No  ? Sexual activity: Not on file  ?Other Topics Concern  ? Not on file  ?Social History Narrative  ? Married.  ? He is a Sports coach

## 2021-05-26 NOTE — Progress Notes (Signed)
Pt is refusing CPAP for the evening. RT will cont to monitor as needed.  ?

## 2021-05-26 NOTE — Consult Note (Signed)
Chief Complaint: Patient was seen in consultation today for  Chief Complaint  Patient presents with   Blood In Stools    Reports bright red blood in stool that began around 0530. Hx of gi bleed    Referring Physician(s): ED physician  Supervising Physician: Gilmer Mor  Patient Status: [facility] - ED  History of Present Illness: Lonnie Hansen is a 71 y.o. male with a medical history significant for AAA, AVR, Chronic ITP, HTN, obstructive sleep apnea, diverticulosis and diverticular bleed. He was recently hospitalized for a diverticular bleed 4/15-4/19 2023 and CTA showed a small focus of active bleeding within the left lateral wall of the mid-sigmoid colon. He was evaluated by GI, IR and Surgery. His bleeding resolved without intervention and he was discharged home 05/14/21  He was in his usual state of health until this morning around 0530 when he awoke with several lower abdominal cramps and bright red rectal bleeding. He was transported to the ED and found to have a hemoglobin of 7.7. He has received numerous units of PRBCs but continues to have active rectal bleeding and intermittently labile blood pressure readings.    Interventional Radiology has been asked to evaluate this patient for an image-guided mesenteric angiogram with possible intervention, possible embolization. Imaging reviewed and procedure approved by Dr. Loreta Ave.   Of note, this patient is familiar to IR from a thoracentesis 10/23/16 and right chest tube placement 10/30/16  Past Medical History:  Diagnosis Date   AAA (abdominal aortic aneurysm) (HCC)    Anemia    Arthritis    Asymptomatic bilateral carotid artery stenosis 08/2015   1-39%    Cataracts, bilateral    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   Coronary artery disease    Diverticulosis    Enlarged aorta (HCC)    Enlarged prostate    slightly   GERD (gastroesophageal reflux disease)    takes Pantoprazole daily as needed   Glaucoma     uses eye drops daily   Headache    History of colon polyps    benign   History of kidney stones    Hyperlipidemia    no on any meds   Hypertension    takes Amlodipine and Atenolol daily   OSA on CPAP    Vocal cord nodule    pt. states  it's a" growth on vocal cord"    Past Surgical History:  Procedure Laterality Date   AORTIC ARCH ANGIOGRAPHY N/A 04/16/2016   Procedure: Aortic Arch Angiography;  Surgeon: [name] Swaziland, MD;  Location: [facility] INVASIVE CV LAB;  Service: Cardiovascular;  Laterality: N/A;   AORTIC VALVE REPLACEMENT N/A 09/15/2016   Procedure: AORTIC VALVE REPLACEMENT (AVR);  Surgeon: Delight Ovens, MD;  Location: [facility] OR;  Service: Open Heart Surgery;  Laterality: N/A;  Using 29mm Edwards Perimount Magna Ease Aortic Bioprosthesis Valve   ASCENDING AORTIC ROOT REPLACEMENT N/A 09/15/2016   Procedure: ASCENDING AORTIC ROOT REPLACEMENT;  Surgeon: Delight Ovens, MD;  Location: [facility] OR;  Service: Open Heart Surgery;  Laterality: N/A;  Using 32mm Gelweave Valsalva Graft   CHOLECYSTECTOMY N/A 12/19/2020   Procedure: LAPAROSCOPIC CHOLECYSTECTOMY WITH POSSIBLE INTRAOPERATIVE CHOLANGIOGRAM;  Surgeon: Gaynelle Adu, MD;  Location: [facility] OR;  Service: General;  Laterality: N/A;   COLONOSCOPY     COLONOSCOPY Left 04/16/2019   Procedure: COLONOSCOPY;  Surgeon: Willis Modena, MD;  Location: [facility] ENDOSCOPY;  Service: Endoscopy;  Laterality: Left;   COLONOSCOPY N/A 06/01/2020   Procedure: COLONOSCOPY;  Surgeon: Marca Ancona,  Marcos Eke, MD;  Location: [facility] ENDOSCOPY;  Service: Gastroenterology;  Laterality: N/A;   COLONOSCOPY WITH ESOPHAGOGASTRODUODENOSCOPY (EGD)     COLONOSCOPY WITH PROPOFOL N/A 05/14/2021   Procedure: COLONOSCOPY WITH PROPOFOL;  Surgeon: Kerin Salen, MD;  Location: [facility] ENDOSCOPY;  Service: Gastroenterology;  Laterality: N/A;   ESOPHAGOGASTRODUODENOSCOPY N/A 05/25/2020   Procedure: ESOPHAGOGASTRODUODENOSCOPY (EGD);  Surgeon: Charlott Rakes, MD;  Location: [facility] ENDOSCOPY;  Service: Endoscopy;   Laterality: N/A;   ESOPHAGOGASTRODUODENOSCOPY (EGD) WITH PROPOFOL Left 04/16/2019   Procedure: ESOPHAGOGASTRODUODENOSCOPY (EGD) WITH PROPOFOL;  Surgeon: Willis Modena, MD;  Location: [facility] ENDOSCOPY;  Service: Endoscopy;  Laterality: Left;   FLEXIBLE SIGMOIDOSCOPY N/A 05/25/2020   Procedure: FLEXIBLE SIGMOIDOSCOPY;  Surgeon: Charlott Rakes, MD;  Location: [facility] ENDOSCOPY;  Service: Endoscopy;  Laterality: N/A;   IR THORACENTESIS ASP PLEURAL SPACE W/IMG GUIDE  10/23/2016   MICROLARYNGOSCOPY Right 09/20/2017   Procedure: MICROLARYNGOSCOPY WITH  EXCISION OF VOCAL CORD LESION;  Surgeon: Newman Pies, MD;  Location: [facility];  Service: ENT;  Laterality: Right;   MULTIPLE EXTRACTIONS WITH ALVEOLOPLASTY N/A 06/10/2016   Procedure: Extraction of tooth #'s 2,8,13,15, and 29  with alveoloplasty, maxillary right and left buccal exostoses reductions, and gross debridement of remaining teeth.;  Surgeon: Charlynne Pander, DDS;  Location: [facility] OR;  Service: Oral Surgery;  Laterality: N/A;   POLYPECTOMY  05/14/2021   Procedure: POLYPECTOMY;  Surgeon: Kerin Salen, MD;  Location: [facility] ENDOSCOPY;  Service: Gastroenterology;;   RIGHT/LEFT HEART CATH AND CORONARY ANGIOGRAPHY N/A 04/16/2016   Procedure: Right/Left Heart Cath and Coronary Angiography;  Surgeon: [name] Swaziland, MD;  Location: [facility] INVASIVE CV LAB;  Service: Cardiovascular;  Laterality: N/A;   TEE WITHOUT CARDIOVERSION N/A 09/15/2016   Procedure: TRANSESOPHAGEAL ECHOCARDIOGRAM (TEE);  Surgeon: Delight Ovens, MD;  Location: [facility] OR;  Service: Open Heart Surgery;  Laterality: N/A;    Allergies: Patient has no known allergies.  Medications: Prior to Admission medications   Medication Sig Start Date End Date Taking? Authorizing Provider  acetaminophen (TYLENOL) 500 MG tablet Take 1,000 mg by mouth daily as needed (pain).   Yes [provider]  amLODipine (NORVASC) 10 MG tablet Take 10 mg by mouth every evening.   Yes [provider]   ANUCORT-HC 25 MG suppository Place 1 suppository rectally daily as needed for pain. 04/29/21  Yes [provider]  atenolol (TENORMIN) 25 MG tablet Take 12.5 mg by mouth 2 (two) times daily. 08/26/20  Yes [name], MD  Ferrous Sulfate (IRON PO) Take 1 tablet by mouth daily.   Yes [provider]  fluticasone (FLONASE) 50 MCG/ACT nasal spray Place 2 sprays into both nostrils daily as needed for allergies or rhinitis.   Yes [provider]  latanoprost (XALATAN) 0.005 % ophthalmic solution Place 1 drop into both eyes 2 (two) times daily.   Yes [provider]  Multiple Vitamin (MULTIVITAMIN WITH MINERALS) TABS tablet Take 1 tablet by mouth daily.   Yes [provider]  pantoprazole (PROTONIX) 40 MG tablet Take 40 mg by mouth 2 (two) times daily. 11/09/20  Yes [provider]  polyethylene glycol (MIRALAX / GLYCOLAX) 17 g packet Take 17 g by mouth daily. Patient taking differently: Take 17 g by mouth See admin instructions. Every other day 06/04/20  Yes [name], MD  rosuvastatin (CRESTOR) 10 MG tablet Take 1 tablet by mouth once daily 05/17/20  Yes Swaziland, [name], MD  rosuvastatin (CRESTOR) 20 MG tablet Take 20 mg by mouth daily.   Yes  [provider]  triamcinolone cream (KENALOG) 0.1 % Apply 1 application. topically 3 (three) times daily as needed (facial itching/rash). 05/16/20  Yes [provider]  ferrous sulfate 325 (65 FE) MG tablet Take 325 mg by mouth daily. Patient not taking: Reported on 05/26/2021    [provider]  psyllium (HYDROCIL/METAMUCIL) 95 % PACK Take 1 packet by mouth daily. Patient not taking: Reported on 05/10/2021 06/04/20   [name], MD     Family History  Problem Relation Age of Onset   Heart disease Father    Heart attack Father    Thyroid disease Sister     Social History   Socioeconomic History   Marital status: Married    Spouse name: Not on file   Number of children:  1   Years of education: Not on file   Highest education level: Not on file  Occupational History   Occupation: Musician  Tobacco Use   Smoking status: Former    Packs/day: 1.00    Years: 25.00    Pack years: 25.00    Types: Cigarettes   Smokeless tobacco: Never  Vaping Use   Vaping Use: Never used  Substance and Sexual Activity   Alcohol use: No    Alcohol/week: 0.0 standard drinks   Drug use: No   Sexual activity: Not on file  Other Topics Concern   Not on file  Social History Narrative   Married.   He is a Theme park manager.   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   Social Determinants of Health   Financial Resource Strain: Not on file  Food Insecurity: No Food Insecurity   Worried About Programme researcher, broadcasting/film/video in the Last Year: Never true   Ran Out of Food in the Last Year: Never true  Transportation Needs: No Transportation Needs   Lack of Transportation (Medical): No   Lack of Transportation (Non-Medical): No  Physical Activity: Not on file  Stress: Not on file  Social Connections: Not on file    Review of Systems: A 12 point ROS discussed and pertinent positives are indicated in the HPI above.  All other systems are negative.  Review of Systems  Constitutional:  Positive for fatigue.  Respiratory:  Negative for cough and shortness of breath.   Cardiovascular:  Negative for chest pain and leg swelling.  Gastrointestinal:  Positive for abdominal pain and blood in stool.  Neurological:  Negative for headaches.   Vital Signs: BP 130/83   Pulse 67   Temp 97.8 F (36.6 C) (Oral)   Resp 12   Ht 5\' 6"  (1.676 m)   Wt 190 lb 0.6 oz (86.2 kg)   SpO2 100%   BMI 30.67 kg/m   Physical Exam Constitutional:      General: He is not in acute distress.    Appearance: He is ill-appearing.  Pulmonary:     Effort: Pulmonary effort is normal.  Abdominal:     Comments: Active GI bleed  Neurological:     Mental Status: He is alert and oriented to person, place, and time.    Imaging: CT  Angio Abd/Pel w/ and/or w/o  Result Date: 05/11/2021 CLINICAL DATA:  Evaluate for lower GI bleed. Patient reports black clots and red blood per rectum which began 2 days prior. Anemia. EXAM: CTA ABDOMEN AND PELVIS WITHOUT AND WITH CONTRAST TECHNIQUE: Multidetector CT imaging of the abdomen and pelvis was performed using the standard protocol during bolus administration of intravenous contrast. Multiplanar reconstructed images and MIPs  were obtained and reviewed to evaluate the vascular anatomy. RADIATION DOSE REDUCTION: This exam was performed according to the departmental dose-optimization program which includes automated exposure control, adjustment of the mA and/or kV according to patient size and/or use of iterative reconstruction technique. CONTRAST:  OMNIPAQUE IOHEXOL 350 MG/ML SOLN COMPARISON:  05/31/2020 FINDINGS: VASCULAR Aorta: Aneurysmal dilatation scratch set there is fusiform aneurysmal dilatation of the infrarenal abdominal aorta with maximum diameter of 4.9 cm, image 102/5. Formally this had a maximum diameter 4.8 cm. Wall adherent mural thrombus identified within the aneurysmal segment of the abdominal aorta. Celiac: Patent without evidence of aneurysm, dissection, vasculitis or significant stenosis. SMA: Patent without evidence of aneurysm, dissection, vasculitis or significant stenosis. Renals: Both renal arteries are patent without evidence of aneurysm, dissection, vasculitis, fibromuscular dysplasia or significant stenosis. IMA: Patent without evidence of aneurysm, dissection, vasculitis or significant stenosis. Inflow: Patent without evidence of aneurysm, dissection, vasculitis or significant stenosis. Proximal Outflow: Bilateral common femoral and visualized portions of the superficial and profunda femoral arteries are patent without evidence of aneurysm, dissection, vasculitis or significant stenosis. Veins: No obvious venous abnormality within the limitations of this arterial phase  study. Review of the MIP images confirms the above findings. NON-VASCULAR Lower chest: No acute abnormality. Hepatobiliary: 7 mm low-density structure within dome of left hepatic lobe is unchanged and remains too small to characterize. No suspicious liver abnormality. Status post cholecystectomy. No bile duct dilatation. Pancreas: Unremarkable. No pancreatic ductal dilatation or surrounding inflammatory changes. Spleen: Normal in size without focal abnormality. Adrenals/Urinary Tract: Adrenal glands appear normal. Unchanged appearance of bilateral Bosniak class 1 cysts. The largest is in the right renal sinus measuring 4.5 cm. No follow-up recommended. Calcification is identified within the upper pole of the left kidney measuring 1.3 cm. No hydronephrosis identified bilaterally. Small diverticulum arises off the anterior dome of the bladder Stomach/Bowel: Stomach appears normal. The appendix is visualized and is within normal limits. There is no bowel wall thickening, inflammation, or distension identified. Extensive sigmoid diverticulosis identified. Wall thickening without surrounding inflammation is noted involving the sigmoid colon likely reflecting sequelae of chronic diverticular disease with circular muscle hypertrophy. There is a small focus of intraluminal contrast within the left lateral wall of the mid sigmoid colon, image 15/5. There is mild progressive accumulation intraluminal contrast noted on the delayed series, image 63/3, compatible with active bleeding. Lymphatic: No significant vascular findings are present. No enlarged abdominal or pelvic lymph nodes. Reproductive: Prostate gland enlargement noted. Other: No free fluid or fluid collections within the abdomen or pelvis. No signs of pneumoperitoneum. Musculoskeletal: Advanced degenerative changes are identified involving the right hip joint. Multilevel degenerative disc disease noted throughout the lumbar spine. No acute or suspicious osseous  findings. IMPRESSION: Vascular: 1. There is fusiform aneurysmal dilatation of the infrarenal abdominal aorta with maximum diameter of 4.9 cm. Formally this had a maximum diameter of 4.8 cm. Recommend follow-up CT/MR every 6 months and vascular consultation. This recommendation follows ACR consensus guidelines: White Paper of the ACR Incidental Findings Committee II on Vascular Findings. J Am Coll Radiol 2013; 10:789-794. Nonvascular: 1. Extensive sigmoid diverticulosis with chronic diverticular disease. There is a small focus of active bleeding within the left lateral wall of the mid sigmoid colon. Aortic Atherosclerosis (ICD10-I70.0). Aortic aneurysm NOS (ICD10-I71.9). Electronically Signed   By: Signa Kell M.D.   On: 05/11/2021 17:08    Labs:  CBC: Recent Labs    05/12/21 0521 05/12/21 1623 05/13/21 0505 05/13/21 1724 05/14/21 5284 05/26/21 1324  05/26/21 0657 05/26/21 0914  WBC 6.0  --  6.5  --  6.9 7.9  --   --   HGB 7.7*   < > 9.0*   < > 9.3* 7.7* 7.8* 8.4*  HCT 23.6*   < > 27.1*   < > 28.1* 24.6* 23.0* 25.4*  PLT 119*  --  117*  --  117* 76*  --   --    < > = values in this interval not displayed.    COAGS: Recent Labs    05/31/20 0523 06/01/20 0954 12/19/20 0047 05/26/21 0650  INR 1.0  --  1.1 1.2  APTT  --  25  --  23*    BMP: Recent Labs    05/11/21 0531 05/12/21 0521 05/14/21 0528 05/26/21 0650 05/26/21 0657  NA 141 138 137 141 141  K 3.6 3.7 3.4* 3.6 3.7  CL 107 105 103 110 106  CO2 26 27 23  21*  --   GLUCOSE 107* 110* 88 151* 154*  BUN 16 15 12 21 23   CALCIUM 9.1 8.8* 8.7* 8.5*  --   CREATININE 1.15 1.25* 1.21 1.37* 1.30*  GFRNONAA >60 >60 >60 55*  --     LIVER FUNCTION TESTS: Recent Labs    05/08/21 0750 05/09/21 1309 05/10/21 1305 05/26/21 0650  BILITOT 0.8 0.7 0.7 0.6  AST 24 19 18 19   ALT 15 12 10 10   ALKPHOS 43 45 39 29*  PROT 7.5 7.0 7.1 5.8*  ALBUMIN 4.6 4.4 4.5 3.6    TUMOR MARKERS: No results for input(s): AFPTM, CEA, CA199,  CHROMGRNA in the last 8760 hours.  Assessment and Plan:  Lower GI bleed: Ikenna C. Cisar, 71 year old male, is scheduled for an urgent image-guided mesenteric angiogram with possible intervention, possible embolization. Dr. Loreta Ave met with the patient and his family at the bedside in the ED to discuss the risks and benefits of this procedure. IR had recommended a nuclear medicine tagged study to identify the source but given the patient's active bleeding and potential for hemodynamic decline we will proceed without this study. Dr. Loreta Ave discussed the possibility of not finding the source of the bleed as well as finding/treating the bleeding vessel and the patient continuing to have a GI bleed. The patient and his family wish to proceed.   Risks and benefits of this procedure were discussed with the patient including, but not limited to bleeding, infection, vascular injury or contrast induced renal failure.  This interventional procedure involves the use of X-rays and because of the nature of the planned procedure, it is possible that we will have prolonged use of X-ray fluoroscopy.  Potential radiation risks to you include (but are not limited to) the following: - A slightly elevated risk for cancer  several years later in life. This risk is typically less than 0.5% percent. This risk is low in comparison to the normal incidence of human cancer, which is 33% for women and 50% for men according to the American Cancer Society. - Radiation induced injury can include skin redness, resembling a rash, tissue breakdown / ulcers and hair loss (which can be temporary or permanent).   The likelihood of either of these occurring depends on the difficulty of the procedure and whether you are sensitive to radiation due to previous procedures, disease, or genetic conditions.   IF your procedure requires a prolonged use of radiation, you will be notified and given written instructions for further action.  It is  your responsibility to  monitor the irradiated area for the 2 weeks following the procedure and to notify your physician if you are concerned that you have suffered a radiation induced injury.    All of the patient's questions were answered, patient is agreeable to proceed.  Consent signed and in IR. He has been NPO.   Thank you for this interesting consult.  I greatly enjoyed meeting ANTE CANSLER and look forward to participating in their care.  A copy of this report was sent to the requesting provider on this date.  Electronically Signed: Alwyn Ren, AGACNP-BC [phone] 05/26/2021, 11:00 AM   I spent a total of 20 Minutes    in face to face in clinical consultation, greater than 50% of which was counseling/coordinating care for mesenteric angiogram with possible intervention, possible embolization.

## 2021-05-26 NOTE — ED Notes (Signed)
Verbal consent for blood obtained prior to emergency release blood hung. Obtained written consent in chart after blood started due to nature of the emergency.  ?

## 2021-05-26 NOTE — ED Notes (Signed)
Pt continues to have bloody BM's  ?

## 2021-05-26 NOTE — Sedation Documentation (Signed)
Pt had a large red colored liquid bowel movement after end of case. SBP and HR dropped to the 50s and pt became pale cool and clammy. 1L NS bolus started and pt positioned to reverse trendelenberg.  Emergent left groin CVC inserted by Dr. Earleen Newport and 1L LR bolus started and infused in new line. Pt remained awake throughout this time but remained shivering. Vital signs normalized after bolus started with SBP in the 110s.  ?

## 2021-05-26 NOTE — Progress Notes (Signed)
?  05/26/21 1614  ?Clinical Encounter Type  ?Visited With Family;Health care provider ?(Pearse Shiffler, wife and patient's sister)  ?Visit Type Code;Critical Care;Initial ?(CODE BLUE)  ?Referral From Nurse  ?Consult/Referral To Chaplain ?Albertina Parr [place])  ?Spiritual Encounters  ?Spiritual Needs Emotional  ? ?Paged for CODE BLUE. Met with patient's wife. Mrs. Coben Godshall and his sister outside patient room. Chaplain provided presence and empathetic listening while medical team was caring for patient. Mrs. Lunz stated that her husband was not feeling well and was admitted through the E.D. earlier this afternoon. Mrs. Nault is finding comfort through her family and declined hospitality at this time.  ?[address] [place], [place] [phone]. ?

## 2021-05-26 NOTE — Progress Notes (Signed)
Patient became pale and lethargic however pulse remained.  Code Blue alarm activated. Assisted patient's respirations,  1 l saline bolus started. BP - 88/68. Code team arrived BP- 91/72 at 1630.  Patient began to rouse.  Pulse was never lost during the event.  Orders placed by MD. ?

## 2021-05-26 NOTE — H&P (Signed)
? ?NAME:  Lonnie PLEMMONS, MRN:  [id number], DOB:  07/09/1950, LOS: 0 ?ADMISSION DATE:  05/26/2021, CONSULTATION DATE:  05/26/2021 ?REFERRING MD:  Wonda Olds., PA, CHIEF COMPLAINT:  GIB  ? ?History of Present Illness:  ? ?71 year old male with prior history of AAA, AVR, chronic ITP, GERD, HLD, OSA on CPAP, HTN, diverticulosis, and diverticular bleed presented to ER with recurrent bloody stools.  ? ?Recent hospitalization for diverticular bleed 4/15- 4/19, evaluated by [facility], IR, and surgery.  Prior CTA 05/11/21 noted small focus of active bleeding within the left lateral wall of the mid sigmoid colon.  His bleeding resolved without intervention.  Surgery felt that left hemicolectomy would  be last resort if IR interventions failed.  ? ?Patient reports has been doing well since discharge but woke up this morning around 0530 with severe lower abdominal cramps with bright red blood rectal bleeding and dizziness with near syncope.  Wife drove him to hospital but stopped given significant bleeding and called EMS.  He is not on any blood thinners or antiplatelets.  Denies any fevers, nausea, vomiting, LOC.  Wife reports bleeding has never been this bad before.  ? ?In ER, he has been afebrile and remains hemodynamically stable but continues to have intermittent lower abdominal cramping with near syncope during episodes followed by recurrent passage of melena.  Hgb down from 9.3 on discharge to today at 7.7, plts 76 (from 117), and sCr 1.37.  Transfused 2 units PRBC in ER but continues to active rectal bleeding.  PCCM consulted for admission.  ? ?Pertinent  Medical History  ?Large infrarenal abdominal aortic aneurysm, AVR, chronic ITP, GERD, HLD, OSA on CPAP, HTN, diverticulosis, and diverticular bleed ? ?Significant Hospital Events: ?Including procedures, antibiotic start and stop dates in addition to other pertinent events   ?Admit active LGIB, GI ([facility]), IR, and surgery consulted  ? ?Interim History / Subjective:  ?Denies any  CP, SOB, dizziness, or current abd pain ? ?Objective   ?Blood pressure 121/75, pulse 64, temperature 97.8 ?F (36.6 ?C), temperature source Oral, resp. rate 19, height '5\' 6"'$  (1.676 m), weight 86.2 kg, SpO2 100 %. ?   ?   ? ?Intake/Output Summary (Last 24 hours) at 05/26/2021 0913 ?Last data filed at 05/26/2021 0825 ?Gross per 24 hour  ?Intake 630 ml  ?Output --  ?Net 630 ml  ? ?Filed Weights  ? 05/26/21 0644  ?Weight: 86.2 kg  ? ? ?Examination: ?General:  Pleasant older male lying in bed in NAD ?HEENT: MM pale/ moist ?Neuro: Aox3, MAE ?CV: rr, no murmur ?PULM:  non labored, CTA ?GI: soft, bs+, ND, no tenderness, wearing diaper which is full of pooled BRB ?Extremities: warm/dry, no LE edema  ?Skin: no rashes, pale ? ?Resolved Hospital Problem list   ? ?Assessment & Plan:  ? ?Hematochezia/ active lower GIB, suspected diverticular ?Diverticulosis  ?- poorly compliant with miralax at home ?- GI and IR consulted in ER.  IR recs for stat NM tagged study to hopefully identify source, consider IR embolization  ?- remains hemodynamically stable but high risk for decompensation  ?- admit to ICU for close monitoring  ?- s/p 2units PRBC in ER.  ?- H/H q 6hrs ?- continues to have intermittent abd cramping f/b large bloody stools.  Additional 2units now with 1 FFP  ?- goal MAP > 65 ?- will consult surgery but last admit, felt left hemicolectomy would be last resort given high risk candidate  ? ? ?ABLA ?- s/p 2 units  PRBC in ER.  Continues to bleed, transfuse as above  ?- goal Hgb > 7 ? ? ?Acute on chronic ITP ?- followed by [name] with PRN q 2 week infusions of romiplostium, last infusion 3/31 ?- goal plt > 50 ? ? ?AKI  ?- sCr on discharge 4/19 1.21, today 1.37 ?- support hemodynamics as above ?- trend renal indices/ strict I/Os ? ? ?Hypocalcemia  ?- 1 gm calcium gluconate now ? ? ?GERD ?- PPI  ? ? ?HTN ?- hold antihypertensives for now in the setting of active GIB ? ? ?HLD ?- hold crestor, NPO ? ? ?OSA on CPAP  ?- q hs  CPAP ? ?Best Practice (right click and "Reselect all SmartList Selections" daily)  ? ?Diet/type: NPO ?DVT prophylaxis: SCD ?GI prophylaxis: PPI ?Lines: N/A ?Foley:  N/A ?Code Status:  full code ?Last date of multidisciplinary goals of care discussion [5/1] ?Wife, Geni Bers [phone] and pt's brother at bedside.  ? ?Labs   ?CBC: ?Recent Labs  ?Lab 05/26/21 ?0650 05/26/21 ?0981  ?WBC 7.9  --   ?NEUTROABS 5.9  --   ?HGB 7.7* 7.8*  ?HCT 24.6* 23.0*  ?MCV 94.6  --   ?PLT 76*  --   ? ? ?Basic Metabolic Panel: ?Recent Labs  ?Lab 05/26/21 ?0650 05/26/21 ?1914  ?NA 141 141  ?K 3.6 3.7  ?CL 110 106  ?CO2 21*  --   ?GLUCOSE 151* 154*  ?BUN 21 23  ?CREATININE 1.37* 1.30*  ?CALCIUM 8.5*  --   ? ?GFR: ?Estimated Creatinine Clearance: 53.7 mL/min (A) (by C-G formula based on SCr of 1.3 mg/dL (H)). ?Recent Labs  ?Lab 05/26/21 ?7829  ?WBC 7.9  ? ? ?Liver Function Tests: ?Recent Labs  ?Lab 05/26/21 ?5621  ?AST 19  ?ALT 10  ?ALKPHOS 29*  ?BILITOT 0.6  ?PROT 5.8*  ?ALBUMIN 3.6  ? ?No results for input(s): LIPASE, AMYLASE in the last 168 hours. ?No results for input(s): AMMONIA in the last 168 hours. ? ?ABG ?   ?Component Value Date/Time  ? PHART 7.283 (L) 05/25/2020 0217  ? PCO2ART 41.8 05/25/2020 0217  ? PO2ART 399 (H) 05/25/2020 0217  ? HCO3 19.9 (L) 05/25/2020 0217  ? TCO2 23 05/26/2021 0657  ? ACIDBASEDEF 7.0 (H) 05/25/2020 0217  ? O2SAT 100.0 05/25/2020 0217  ?  ? ?Coagulation Profile: ?Recent Labs  ?Lab 05/26/21 ?3086  ?INR 1.2  ? ? ?Cardiac Enzymes: ?No results for input(s): CKTOTAL, CKMB, CKMBINDEX, TROPONINI in the last 168 hours. ? ?HbA1C: ?Hgb A1c MFr Bld  ?Date/Time Value Ref Range Status  ?09/11/2016 10:48 AM 5.3 4.8 - 5.6 % Final  ?  Comment:  ?  (NOTE) ?Pre diabetes:          5.7%-6.4% ?Diabetes:              >6.4% ?Glycemic control for   <7.0% ?adults with diabetes ?  ?04/28/2010 07:03 AM  <5.7 % Final  ? 5.6 ?(NOTE)                                                                       According to the ADA  Clinical Practice Recommendations for 2011, when HbA1c is used as a screening test:   >=6.5%   Diagnostic  of Diabetes Mellitus           (if abnormal result ? is confirmed)  5.7-6.4%   Increased risk of developing Diabetes Mellitus  References:Diagnosis and Classification of Diabetes Mellitus,Diabetes GNOI,3704,88(QBVQX 1):S62-S69 and Standards of Medical Care in         Diabetes - 2011,Diabetes IHWT,8882,80  ?(Suppl 1):S11-S61.  ? ? ?CBG: ?No results for input(s): GLUCAP in the last 168 hours. ? ?Review of Systems:   ?Review of Systems  ?Constitutional:  Negative for chills and fever.  ?Respiratory:  Negative for shortness of breath.   ?Cardiovascular:  Negative for chest pain, palpitations and leg swelling.  ?Gastrointestinal:  Positive for abdominal pain, blood in stool and melena. Negative for nausea and vomiting.  ?Neurological:  Positive for dizziness. Negative for focal weakness.  ? ?Past Medical History:  ?He,  has a past medical history of AAA (abdominal aortic aneurysm) ([place]), Anemia, Arthritis, Asymptomatic bilateral carotid artery stenosis (08/2015), Cataracts, bilateral, Chronic ITP (idiopathic thrombocytopenia) ([place]) (03/31/2018), Coronary artery disease, Diverticulosis, Enlarged aorta ([place]), Enlarged prostate, GERD (gastroesophageal reflux disease), Glaucoma, Headache, History of colon polyps, History of kidney stones, Hyperlipidemia, Hypertension, OSA on CPAP, and Vocal cord nodule.  ? ?Surgical History:  ? ?Past Surgical History:  ?Procedure Laterality Date  ? AORTIC ARCH ANGIOGRAPHY N/A 04/16/2016  ? Procedure: Aortic Arch Angiography;  Surgeon: [name] Martinique, MD;  Location: [facility];  Service: Cardiovascular;  Laterality: N/A;  ? AORTIC VALVE REPLACEMENT N/A 09/15/2016  ? Procedure: AORTIC VALVE REPLACEMENT (AVR);  Surgeon: Grace Isaac, MD;  Location: [facility];  Service: Open Heart Surgery;  Laterality: N/A;  Using 52m Edwards Perimount Magna Ease Aortic Bioprosthesis Valve  ?  ASCENDING AORTIC ROOT REPLACEMENT N/A 09/15/2016  ? Procedure: ASCENDING AORTIC ROOT REPLACEMENT;  Surgeon: GGrace Isaac MD;  Location: [facility]  Service: Open Heart Surgery;  Laterality: N/A;  Using 363mGelweave Valsa

## 2021-05-26 NOTE — Procedures (Signed)
Interventional Radiology Procedure Note ? ?Procedure:  ? ?US guided right CFA access ?US guided left CFV central venous catheter ?Mesenteric angiogram of IMA with intention to treat presumed lower GI bleed ?Coil embolization of the marginal artery local to vasa recta at site of hemorrhage.  The watershed marginal artery (Sudek's point) was treated from left colic as well as the superior rectal arteries.  ? ?.  ?Complications: None ? ?Recommendations:  ?- Ok to log roll.  ?- Right hip straight x 1 hr ?- left hip straight until CFV catheter is removed ?- agree with serial H&H ?- LR bolus is running now secondary to vagal event at the conclusion ?- Do not submerge femoral line ?- Do not submerge RCFA access x 1 week ?- Routine line care  ? ?Disposition: ?Stable now to the ICU. ? ? ? ?Signed, ? ?Lonnie Hansen. Earleen Newport, DO ? ? ?

## 2021-05-27 DIAGNOSIS — K922 Gastrointestinal hemorrhage, unspecified: Secondary | ICD-10-CM | POA: Diagnosis not present

## 2021-05-27 LAB — CBC
HCT: 25.2 % — ABNORMAL LOW (ref 39.0–52.0)
HCT: 23.8 % — ABNORMAL LOW (ref 39.0–52.0)
Hemoglobin: 8.3 g/dL — ABNORMAL LOW (ref 13.0–17.0)
Hemoglobin: 7.9 g/dL — ABNORMAL LOW (ref 13.0–17.0)
MCH: 28.9 pg (ref 26.0–34.0)
MCH: 29.6 pg (ref 26.0–34.0)
MCHC: 32.9 g/dL (ref 30.0–36.0)
MCHC: 33.2 g/dL (ref 30.0–36.0)
MCV: 87.8 fL (ref 80.0–100.0)
MCV: 89.1 fL (ref 80.0–100.0)
Platelets: 50 10*3/uL — ABNORMAL LOW (ref 150–400)
Platelets: 55 10*3/uL — ABNORMAL LOW (ref 150–400)
RBC: 2.87 MIL/uL — ABNORMAL LOW (ref 4.22–5.81)
RBC: 2.67 MIL/uL — ABNORMAL LOW (ref 4.22–5.81)
RDW: 15 % (ref 11.5–15.5)
RDW: 15.6 % — ABNORMAL HIGH (ref 11.5–15.5)
WBC: 9.1 10*3/uL (ref 4.0–10.5)
WBC: 11.3 10*3/uL — ABNORMAL HIGH (ref 4.0–10.5)
nRBC: 0.3 % — ABNORMAL HIGH (ref 0.0–0.2)
nRBC: 0.3 % — ABNORMAL HIGH (ref 0.0–0.2)

## 2021-05-27 LAB — PREPARE FRESH FROZEN PLASMA: Unit division: 0

## 2021-05-27 LAB — BPAM FFP
Blood Product Expiration Date: 202305062359
ISSUE DATE / TIME: 202305011024
Unit Type and Rh: 8400

## 2021-05-27 LAB — PREPARE PLATELET PHERESIS: Unit division: 0

## 2021-05-27 LAB — BPAM PLATELET PHERESIS
Blood Product Expiration Date: 202305022359
Unit Type and Rh: 6200

## 2021-05-27 LAB — RENAL FUNCTION PANEL
Albumin: 2.9 g/dL — ABNORMAL LOW (ref 3.5–5.0)
Anion gap: 10 (ref 5–15)
BUN: 24 mg/dL — ABNORMAL HIGH (ref 8–23)
CO2: 20 mmol/L — ABNORMAL LOW (ref 22–32)
Calcium: 7.8 mg/dL — ABNORMAL LOW (ref 8.9–10.3)
Chloride: 109 mmol/L (ref 98–111)
Creatinine, Ser: 1.69 mg/dL — ABNORMAL HIGH (ref 0.61–1.24)
GFR, Estimated: 43 mL/min — ABNORMAL LOW (ref >60)
Glucose, Bld: 129 mg/dL — ABNORMAL HIGH (ref 70–99)
Phosphorus: 3.9 mg/dL (ref 2.5–4.6)
Potassium: 4.1 mmol/L (ref 3.5–5.1)
Sodium: 139 mmol/L (ref 135–145)

## 2021-05-27 LAB — GLUCOSE, CAPILLARY
Glucose-Capillary: 126 mg/dL — ABNORMAL HIGH (ref 70–99)
Glucose-Capillary: 128 mg/dL — ABNORMAL HIGH (ref 70–99)

## 2021-05-27 MED ORDER — PANTOPRAZOLE SODIUM 40 MG PO TBEC: 40.0000 mg | DELAYED_RELEASE_TABLET | Freq: Two times a day (BID) | ORAL | Status: DC

## 2021-05-27 MED ORDER — PANTOPRAZOLE SODIUM 40 MG PO TBEC
40.0000 mg | DELAYED_RELEASE_TABLET | Freq: Two times a day (BID) | ORAL | Status: DC
  Administered 2021-05-27 – 2021-05-30 (×6): 40 mg via ORAL

## 2021-05-27 MED ORDER — LATANOPROST 0.005 % OP SOLN
1.0000 [drp] | Freq: Every day | OPHTHALMIC | Status: DC
  Administered 2021-05-27: 1 [drp] via OPHTHALMIC
  Filled 2021-05-27: qty 2.5

## 2021-05-27 MED ORDER — ORAL CARE MOUTH RINSE
15.0000 mL | Freq: Two times a day (BID) | OROMUCOSAL | Status: DC
  Administered 2021-05-27 – 2021-05-30 (×6): 15 mL via OROMUCOSAL

## 2021-05-27 NOTE — Progress Notes (Signed)
[facility] Progress Note ? ?Lonnie Hansen 71 y.o. 1950-08-12 ? ? ?Subjective: ?Awake lying in bed. Denies bleeding overnight. Denies abdominal pain. ? ?Objective: ?Vital signs: ?Vitals:  ? 05/27/21 0745 05/27/21 0800  ?BP: 135/84 (!) 153/97  ?Pulse: 86 97  ?Resp: 17 16  ?Temp:    ?SpO2: 96% 98%  ?T 99.1 ? ?Physical Exam: ?Gen: lethargic, elderly, no acute distress  ?HEENT: anicteric sclera, poor dentition ?CV: RRR ?Chest: CTA B ?Abd: soft, nontender, nondistended, +BS ?Ext: no edema ? ?Lab Results: ?Recent Labs  ?  05/26/21 ?0650 05/26/21 ?2595 05/27/21 ?0421  ?NA 141 141 139  ?K 3.6 3.7 4.1  ?CL 110 106 109  ?CO2 21*  --  20*  ?GLUCOSE 151* 154* 129*  ?BUN 21 23 24*  ?CREATININE 1.37* 1.30* 1.69*  ?CALCIUM 8.5*  --  7.8*  ?PHOS  --   --  3.9  ? ?Recent Labs  ?  05/26/21 ?0650 05/27/21 ?0421  ?AST 19  --   ?ALT 10  --   ?ALKPHOS 29*  --   ?BILITOT 0.6  --   ?PROT 5.8*  --   ?ALBUMIN 3.6 2.9*  ? ?Recent Labs  ?  05/26/21 ?0650 05/26/21 ?6387 05/26/21 ?1658 05/26/21 ?2151 05/27/21 ?0421  ?WBC 7.9  --  7.9  --  9.1  ?NEUTROABS 5.9  --  6.8  --   --   ?HGB 7.7*   < > 6.1* 9.4* 8.3*  ?HCT 24.6*   < > 18.3* 27.7* 25.2*  ?MCV 94.6  --  89.3  --  87.8  ?PLT 76*  --  44*  --  50*  ? < > = values in this interval not displayed.  ? ? ? ? ?Assessment/Plan: ?Diverticular bleed - s/p embolization by IR. Stable. Hgb 8.3 but no more overt bleeding. Clear liquid diet. Supportive care. Will follow. ? ? ?Lonnie Hansen ?05/27/2021, 9:51 AM ? ?Questions please call [phone] Patient ID: Lonnie Hansen, male   DOB: 08/31/1950, 71 y.o.   MRN: [id number] ? ?

## 2021-05-27 NOTE — Progress Notes (Signed)
? ? ?Referring Physician(s): ?Dr Warnell Bureau ? ?Supervising Physician: Corrie Mckusick ? ?Patient Status:  [facility] - In-pt ? ?Chief Complaint: ? ?GI Bleed ?Coil embolization of the marginal artery supplying the Vasa ?recta contributing to sigmoid colon hemorrhage ? ?Subjective: ? ?Doing well ?Stable ?Hg 8.3 this am ?Last transfusion this am--- FFP ? ?Denies abd pain ?+flatus ?Denies any blood per rectum ? ? ? ?Allergies: ?Patient has no known allergies. ? ?Medications: ?Prior to Admission medications   ?Medication Sig Start Date End Date Taking? Authorizing Provider  ?acetaminophen (TYLENOL) 500 MG tablet Take 1,000 mg by mouth daily as needed (pain).   Yes [provider]  ?amLODipine (NORVASC) 10 MG tablet Take 10 mg by mouth every evening.   Yes [provider]  ?ANUCORT-HC 25 MG suppository Place 1 suppository rectally daily as needed for pain. 04/29/21  Yes [provider]  ?atenolol (TENORMIN) 25 MG tablet Take 12.5 mg by mouth 2 (two) times daily. 08/26/20  Yes [name], MD  ?Ferrous Sulfate (IRON PO) Take 1 tablet by mouth daily.   Yes [provider]  ?fluticasone (FLONASE) 50 MCG/ACT nasal spray Place 2 sprays into both nostrils daily as needed for allergies or rhinitis.   Yes [provider]  ?latanoprost (XALATAN) 0.005 % ophthalmic solution Place 1 drop into both eyes 2 (two) times daily.   Yes [provider]  ?Multiple Vitamin (MULTIVITAMIN WITH MINERALS) TABS tablet Take 1 tablet by mouth daily.   Yes [provider]  ?pantoprazole (PROTONIX) 40 MG tablet Take 40 mg by mouth 2 (two) times daily. 11/09/20  Yes [provider]  ?polyethylene glycol (MIRALAX / GLYCOLAX) 17 g packet Take 17 g by mouth daily. ?Patient taking differently: Take 17 g by mouth See admin instructions. Every other day 06/04/20  Yes [name], MD  ?rosuvastatin (CRESTOR) 10 MG tablet Take 1 tablet by mouth once daily 05/17/20  Yes Martinique, [name], MD   ?rosuvastatin (CRESTOR) 20 MG tablet Take 20 mg by mouth daily.   Yes [provider]  ?triamcinolone cream (KENALOG) 0.1 % Apply 1 application. topically 3 (three) times daily as needed (facial itching/rash). 05/16/20  Yes [provider]  ?ferrous sulfate 325 (65 FE) MG tablet Take 325 mg by mouth daily. ?Patient not taking: Reported on 05/26/2021    [provider]  ?psyllium (HYDROCIL/METAMUCIL) 95 % PACK Take 1 packet by mouth daily. ?Patient not taking: Reported on 05/10/2021 06/04/20   [name], MD  ? ? ? ?Vital Signs: ?BP (!) 153/97   Pulse 97   Temp 97.8 ?F (36.6 ?C) (Oral)   Resp 16   Ht '5\' 6"'$  (1.676 m)   Wt 201 lb 4.5 oz (91.3 kg)   SpO2 98%   BMI 32.49 kg/m?  ? ?Physical Exam ?Vitals reviewed.  ?Abdominal:  ?   Palpations: Abdomen is soft.  ?   Tenderness: There is no abdominal tenderness.  ?Skin: ?   General: Skin is warm.  ?   Comments: Rt groin NT no bleeding ?No hematoma ?2+ pulses on Rt foot  ? ? ?Imaging: ?DG Abd 1 View ? ?Result Date: 05/26/2021 ?CLINICAL DATA:  Evaluate for bleeding EXAM: ABDOMEN - 1 VIEW COMPARISON:  CT done on 05/11/2021 FINDINGS: There is contrast in the urinary bladder from previous contrast administration. There is a large caliber catheter in the course of left femoral and iliac vessels. There are vascular coils superimposed over the left upper portion of sacrum. There is  no extravasation of contrast from the urinary bladder. There are no abnormal pockets of contrast in the lower abdomen and pelvis. Severe degenerative changes are noted in the right hip. Degenerative changes are noted in the visualized lower lumbar spine. IMPRESSION: Urinary bladder is filled with contrast with no sign of extravasation. There are vascular coils overlying the upper aspect of left side of sacrum adjacent to the tip of a vascular catheter. Electronically Signed   By: Elmer Picker M.D.   On: 05/26/2021 14:56  ? ?IR Angiogram Visceral Selective ? ?Result  Date: 05/26/2021 ?INDICATION: 71 year old male with a history bright red blood per rectum with presumed lower GI hemorrhage based on CT angiogram of 05/11/2021. He presents for intention to treat a presumed lower GI hemorrhage with angiogram and embolization. EXAM: ULTRASOUND-GUIDED ACCESS RIGHT COMMON FEMORAL ARTERY MESENTERIC ANGIOGRAM OF IMA SUPER SELECTIVE CATHETER PLACEMENT OF LEFT COLIC ARTERY AND SUPERIOR RECTAL ARTERIES COIL EMBOLIZATION OF THE MARGINAL ARTERY WATERSHED REGION AFFECTING THE VASA RECTA AS THE SOURCE OF ACUTE SIGMOID COLON HEMORRHAGE IMAGE GUIDED PLACEMENT OF LEFT COMMON FEMORAL VEIN CENTRAL VENOUS CATHETER MEDICATIONS: None ANESTHESIA/SEDATION: Moderate (conscious) sedation was employed during this procedure. A total of Versed 1.0 mg and Fentanyl 25 mcg was administered intravenously by the radiology nurse. Total intra-service moderate Sedation Time: 81.5 minutes. The patient's level of consciousness and vital signs were monitored continuously by radiology nursing throughout the procedure under my direct supervision. CONTRAST:  60 cc Omni 300 FLUOROSCOPY: Radiation Exposure Index (as provided by the fluoroscopic device): 3,875 mGy [place] COMPLICATIONS: None immediate. PROCEDURE: Informed consent was obtained from the patient following explanation of the procedure, risks, benefits and alternatives. The patient understands, agrees and consents for the procedure. All questions were addressed. A time out was performed prior to the initiation of the procedure. Maximal barrier sterile technique utilized including caps, mask, sterile gowns, sterile gloves, large sterile drape, hand hygiene, and Betadine prep. Ultrasound survey of the right inguinal region was performed with images stored and sent to PACs, confirming patency of the vessel. A micropuncture needle was used access the right common femoral artery under ultrasound. With excellent arterial blood flow returned, and an .018 micro wire was  passed through the needle, observed enter the abdominal aorta under fluoroscopy. The needle was removed, and a micropuncture sheath was placed over the wire. The inner dilator and wire were removed, and an 035 Bentson wire was advanced under fluoroscopy into the abdominal aorta. The sheath was removed and a standard 5 [place] vascular sheath was placed. The dilator was removed and the sheath was flushed. Mickelson catheter was passed on the Bentson wire, with the curve formed within the abdominal aorta. The catheter was used to select the origin of the inferior mesenteric artery. Angiogram was performed. 150 cm STC microcatheter with a straight tip was then passed with a 14 fathom wire through the base catheter, and used to sequentially select superior hemorrhoidal arteries and the left colic artery. After multiple angiogram performed, there was no observed extravasation of contrast or pooling of contrast. Given that the patient was experiencing multiple bouts of hematochezia during the case, as well as decreasing systolic blood pressure, we elected to proceed with a tPA challenge within the superior rectal arteries, in attempt to uncover the site of hemorrhage. 2 mg of tPA diluted in 10 cc of saline was slowly pushes the microcatheter, with the catheter position in the proximal superior hemorrhoidal arteries. We then proceeded with further angiogram interrogation of multiple superior rectal arteries  and the left colic artery. Ultimately there was a site of puddling contrast in the watershed region affected by the Vasa recta arteries between the left colic artery and the superior hemorrhoidal arteries, the formal region of Sudeck's point. We then proceeded with coil embolization of the marginal artery in a super selective position within the marginal artery supplying the targeted Vasa recta arteries. Three 2 mm coils were deployed and then the catheter was withdrawn for repeat angiogram demonstrating cessation of  flow through the marginal artery. The catheter was then repositioned into the marginal artery from the superior rectal arteries and coil embolization was performed with a single 3 mm x 6 mm coil. Catheter was withdrawn and a

## 2021-05-27 NOTE — Progress Notes (Signed)
? ?NAME:  Lonnie Hansen, MRN:  [id number], DOB:  [birth year], LOS: 1 ?ADMISSION DATE:  05/26/2021, CONSULTATION DATE:  05/26/2021 ?REFERRING MD:  Wonda Olds., PA, CHIEF COMPLAINT:  GIB  ? ?History of Present Illness:  ? ?71 year old male with prior history of AAA, AVR, chronic ITP, GERD, HLD, OSA on CPAP, HTN, diverticulosis, and diverticular bleed presented to ER with recurrent bloody stools.  ? ?Recent hospitalization for diverticular bleed 4/15- 4/19, evaluated by [facility], IR, and surgery.  Prior CTA 05/11/21 noted small focus of active bleeding within the left lateral wall of the mid sigmoid colon.  His bleeding resolved without intervention.  Surgery felt that left hemicolectomy would  be last resort if IR interventions failed.  ? ?Patient reports has been doing well since discharge but woke up this morning around 0530 with severe lower abdominal cramps with bright red blood rectal bleeding and dizziness with near syncope.  Wife drove him to hospital but stopped given significant bleeding and called EMS.  He is not on any blood thinners or antiplatelets.  Denies any fevers, nausea, vomiting, LOC.  Wife reports bleeding has never been this bad before.  ? ?In ER, he has been afebrile and remains hemodynamically stable but continues to have intermittent lower abdominal cramping with near syncope during episodes followed by recurrent passage of melena.  Hgb down from 9.3 on discharge to today at 7.7, plts 76 (from 117), and sCr 1.37.  Transfused 2 units PRBC in ER but continues to active rectal bleeding.  PCCM consulted for admission.  ? ?Pertinent  Medical History  ?Large infrarenal abdominal aortic aneurysm, AVR, chronic ITP, GERD, HLD, OSA on CPAP, HTN, diverticulosis, and diverticular bleed ? ?Significant Hospital Events: ?Including procedures, antibiotic start and stop dates in addition to other pertinent events   ?Admit active LGIB, GI ([facility]), IR, and surgery consulted  ? ?Interim History / Subjective:  ?No  abdominal pain. Tolerating CLD well. Left femoral introducer sheath removed. ? ?Objective   ?Blood pressure (!) 153/97, pulse 97, temperature 97.8 ?F (36.6 ?C), temperature source Oral, resp. rate 16, height '5\' 6"'$  (1.676 m), weight 91.3 kg, SpO2 98 %. ?   ?   ? ?Intake/Output Summary (Last 24 hours) at 05/27/2021 1153 ?Last data filed at 05/27/2021 0800 ?Gross per 24 hour  ?Intake 855 ml  ?Output 500 ml  ?Net 355 ml  ? ? ?Filed Weights  ? 05/26/21 0644 05/26/21 1200  ?Weight: 86.2 kg 91.3 kg  ? ? ?Examination: ?General:  Pleasant older male lying in bed in NAD ?HEENT: MM pale/ moist ?Neuro: Aox3, MAE ?CV: tachy, rr, no murmur ?PULM:  non labored, CTA ?GI: soft, bs+, ND, no tenderness ?Extremities: warm/dry, no LE edema, dressing in place over left CVC site, right CFA access  ?Skin: no rashes, pale ? ?Resolved Hospital Problem list   ? ?Assessment & Plan:  ? ?Diverticular bleeding s/p IR embolization  ?Diverticulosis  ?ABLA ?- maintain 2 large bore PIVs ?- GI, IR,surgery following ?- remains hemodynamically stable but high risk for decompensation  ?- admit to ICU for close monitoring  ?- cbc this evening and then daily ?- goal Hgb > 7 ? ?Acute on chronic ITP ?- followed by [name] with PRN q 2 week infusions of romiplostium, last infusion 3/31 ?- goal plt > 50 if active bleeding ? ?AKI  ?- support hemodynamics as above ?- trend renal indices/ strict I/Os ?- minimize nephrotoxins ? ?GERD ?- PPI  ? ?HTN ?- hold antihypertensives  for now in the setting of resolving GIB ? ?HLD ?- ok to resume crestor ? ?OSA on CPAP  ?- q hs CPAP ? ?Best Practice (right click and "Reselect all SmartList Selections" daily)  ? ?Diet/type: clear liquids ?DVT prophylaxis: SCD ?GI prophylaxis: PPI ?Lines: N/A ?Foley:  N/A ?Code Status:  full code ?Last date of multidisciplinary goals of care discussion  ?Wife, Geni Bers [phone] updated at bedside today  ? ?Critical care time: n/a ?  ? ? ? ?Walker Shadow  ?[place] ? ?05/27/2021, 11:53 AM ? ?See Amion for pager ?If no response to pager, please call PCCM consult pager ?After 7:00 pm call Elink   ? ? ? ? ?

## 2021-05-27 NOTE — Progress Notes (Signed)
Progress Note: General Surgery Service  ? ?Chief Complaint/Subjective: ?No complaints this morning.  Feels well. ? ?Objective: ?Vital signs in last 24 hours: ?Temp:  [97.6 ?F (36.4 ?C)-99.1 ?F (37.3 ?C)] 99.1 ?F (37.3 ?C) (05/02 0726) ?Pulse Rate:  [55-103] 97 (05/02 0800) ?Resp:  [9-24] 16 (05/02 0800) ?BP: (55-153)/(39-104) 153/97 (05/02 0800) ?SpO2:  [70 %-100 %] 98 % (05/02 0800) ?Weight:  [91.3 kg] 91.3 kg (05/01 1200) ?Last BM Date : 05/26/21 ? ?Intake/Output from previous day: ?05/01 0701 - 05/02 0700 ?In: 2239 [Blood:2239] ?Out: 500 [Urine:500] ?Intake/Output this shift: ?Total I/O ?In: 240 [P.O.:240] ?Out: -  ? ?Constitutional: NAD; conversant; no deformities ?Eyes: Moist conjunctiva; no lid lag; anicteric; PERRL ?Neck: Trachea midline; no thyromegaly ?Lungs: Normal respiratory effort; no tactile fremitus ?CV: RRR; no palpable thrills; no pitting edema ?GI: Abd Soft, nontender; no palpable hepatosplenomegaly ?MSK: Normal range of motion of extremities; no clubbing/cyanosis ?Psychiatric: Appropriate affect; alert and oriented x3 ?Lymphatic: No palpable cervical or axillary lymphadenopathy ? ?Lab Results: ?CBC  ?Recent Labs  ?  05/26/21 ?1658 05/26/21 ?2151 05/27/21 ?0421  ?WBC 7.9  --  9.1  ?HGB 6.1* 9.4* 8.3*  ?HCT 18.3* 27.7* 25.2*  ?PLT 44*  --  50*  ? ?BMET ?Recent Labs  ?  05/26/21 ?0650 05/26/21 ?0657 05/27/21 ?0421  ?NA 141 141 139  ?K 3.6 3.7 4.1  ?CL 110 106 109  ?CO2 21*  --  20*  ?GLUCOSE 151* 154* 129*  ?BUN 21 23 24*  ?CREATININE 1.37* 1.30* 1.69*  ?CALCIUM 8.5*  --  7.8*  ? ?PT/INR ?Recent Labs  ?  05/26/21 ?0650  ?LABPROT 15.1  ?INR 1.2  ? ?ABG ?No results for input(s): PHART, HCO3 in the last 72 hours. ? ?Invalid input(s): PCO2, PO2 ? ?Anti-infectives: ?Anti-infectives (From admission, onward)  ? ? None  ? ?  ? ? ?Medications: ?Scheduled Meds: ? sodium chloride   Intravenous Once  ? sodium chloride   Intravenous Once  ? Chlorhexidine Gluconate Cloth  6 each Topical Daily  ? mouth rinse  15 mL  Mouth Rinse BID  ? pantoprazole (PROTONIX) IV  40 mg Intravenous Q24H  ? ?Continuous Infusions: ? sodium chloride Stopped (05/26/21 0743)  ? sodium chloride Stopped (05/26/21 0936)  ? ?PRN Meds:.ondansetron ? ?Assessment/Plan: ?Mr. Mayabb is a 71 year old male s/p IR embolization of diverticular bleed.   ? ?Diverticular bleeding ?- Controlled in IR 05/26/21 ?- Monitor for ischemia progressing to perforation that may still require colectomy ?- Try liquid diet today ?- Surgery team will follow ? ? ? LOS: 1 day  ? ? ?Felicie Morn, MD ? ?[facility] ?Use [email or web address] to contact on call provider ? ?Daily Billing: ?[phone] - Straightforward / Low MDM ? ? ?

## 2021-05-28 ENCOUNTER — Ambulatory Visit: Payer: Medicare Other | Admitting: Family

## 2021-05-28 ENCOUNTER — Other Ambulatory Visit: Payer: Medicare Other

## 2021-05-28 ENCOUNTER — Encounter (HOSPITAL_COMMUNITY): Payer: Self-pay | Admitting: Internal Medicine

## 2021-05-28 ENCOUNTER — Telehealth: Payer: Self-pay | Admitting: *Deleted

## 2021-05-28 ENCOUNTER — Ambulatory Visit: Payer: Medicare Other

## 2021-05-28 LAB — CBC
HCT: 20.6 % — ABNORMAL LOW (ref 39.0–52.0)
Hemoglobin: 6.9 g/dL — CL (ref 13.0–17.0)
MCH: 29.9 pg (ref 26.0–34.0)
MCHC: 33.5 g/dL (ref 30.0–36.0)
MCV: 89.2 fL (ref 80.0–100.0)
Platelets: 46 10*3/uL — ABNORMAL LOW (ref 150–400)
RBC: 2.31 MIL/uL — ABNORMAL LOW (ref 4.22–5.81)
RDW: 15.6 % — ABNORMAL HIGH (ref 11.5–15.5)
WBC: 9 10*3/uL (ref 4.0–10.5)
nRBC: 0.2 % (ref 0.0–0.2)

## 2021-05-28 LAB — HEMOGLOBIN AND HEMATOCRIT, BLOOD
HCT: 22.9 % — ABNORMAL LOW (ref 39.0–52.0)
HCT: 24.3 % — ABNORMAL LOW (ref 39.0–52.0)
Hemoglobin: 7.7 g/dL — ABNORMAL LOW (ref 13.0–17.0)
Hemoglobin: 8 g/dL — ABNORMAL LOW (ref 13.0–17.0)

## 2021-05-28 LAB — BASIC METABOLIC PANEL
Anion gap: 5 (ref 5–15)
BUN: 21 mg/dL (ref 8–23)
CO2: 24 mmol/L (ref 22–32)
Calcium: 8 mg/dL — ABNORMAL LOW (ref 8.9–10.3)
Chloride: 111 mmol/L (ref 98–111)
Creatinine, Ser: 1.36 mg/dL — ABNORMAL HIGH (ref 0.61–1.24)
GFR, Estimated: 56 mL/min — ABNORMAL LOW (ref >60)
Glucose, Bld: 108 mg/dL — ABNORMAL HIGH (ref 70–99)
Potassium: 3.4 mmol/L — ABNORMAL LOW (ref 3.5–5.1)
Sodium: 140 mmol/L (ref 135–145)

## 2021-05-28 LAB — PREPARE RBC (CROSSMATCH)

## 2021-05-28 MED ORDER — LATANOPROST 0.005 % OP SOLN
1.0000 [drp] | Freq: Two times a day (BID) | OPHTHALMIC | Status: DC
  Administered 2021-05-28 – 2021-05-30 (×4): 1 [drp] via OPHTHALMIC
  Filled 2021-05-28: qty 2.5

## 2021-05-28 MED ORDER — POTASSIUM CHLORIDE 10 MEQ/100ML IV SOLN
10.0000 meq | INTRAVENOUS | Status: AC
  Administered 2021-05-28 (×2): 10 meq via INTRAVENOUS

## 2021-05-28 MED ORDER — POTASSIUM CHLORIDE 10 MEQ/100ML IV SOLN
10.0000 meq | INTRAVENOUS | Status: DC
  Administered 2021-05-28 (×2): 10 meq via INTRAVENOUS
  Filled 2021-05-28 (×2): qty 100

## 2021-05-28 MED ORDER — SODIUM CHLORIDE 0.9% IV SOLUTION: Freq: Once | INTRAVENOUS | Status: AC

## 2021-05-28 MED ORDER — FLUTICASONE PROPIONATE 50 MCG/ACT NA SUSP
1.0000 | Freq: Every day | NASAL | Status: DC
  Administered 2021-05-28 – 2021-05-30 (×3): 1 via NASAL
  Filled 2021-05-28 (×2): qty 16

## 2021-05-28 NOTE — Progress Notes (Signed)
?PROGRESS NOTE ? ?Lonnie Hansen  ATF:[id number] DOB: [birth year] DOA: 05/26/2021 ?PCP: [name], MD  ? ?Brief Narrative: ? ?Patient is a 71 year old male with history of abdominal aortic aneurysm, aortic valve replacement, chronic ITP, GERD, hyperlipidemia, OSA on CPAP, hypertension, diverticular bleed who presents with bloody stools.  He was recently hospitalized for diverticular bleed in April, seen by GI, IR, surgery, at that time bleeding resolved without any need for intervention.  Patient was discharged home but started having bloody bowel movements, associated dizziness with near syncope.  On presentation he was found to be anemic with hemoglobin in the range of 7, thrombocytopenia.  Transfused 2 units of PRBC here in the emergency department for continuous active rectal bleeding.  PCCM, IR, general surgery consulted.  Underwent coil embolization of the marginal artery supplying the vasa recta contributing to sigmoid colon hemorrhage on 5/1. ? ?Assessment & Plan: ? ?Lower GI bleed: Presented with recurrent bloody stool/hematochezia.  Diverticular bleed suspected.  GI, general surgery, IR consulted.  Transfused with total of 3 units of PRBCs during this hospitalization for active rectal bleeding.  Underwent coil embolization of the marginal artery supplying the vasa recta contributing to sigmoid colon hemorrhage.  General surgery closely following for the need of hemicolectomy.  No complaint of hematochezia currently. ? ?Acute blood loss anemia: Due to lower GI bleed.  Hemoglobin dropped to the range of 6.9 today. Transfused with a unit of  PRBC. ? ?Acute on chronic ITP: Has chronic thrombocytopenia.  Transfused with a unit of platelets .  Follows with [name] for infusion of Romiplostim, last infusion on 3/31.  Goal of platelets should be more than 50,000 if actively bleeding. ? ?AKI: Continue to monitor BMP.  Monitor input/output, minimize nephrotoxins,improving ? ?Hypokalemia: Supplemented with  potassium. ? ?GERD: Continue PPI ? ?Hypertension: Antihypertensives on hold due to GI bleed ? ?Hyperlipidemia :on Crestor at home ? ?OSA: On CPAP at night ? ?Obesity: BMI of 31.5 ? ? ?  ?  ?  ? ?DVT prophylaxis:SCDs Start: 05/26/21 0911 ? ? ?  Code Status: Full Code ? ?Family Communication: None at bedside ? ?Patient status:Inpatient ? ?Patient is from :Home ? ?Anticipated discharge YH:CWCB ? ?Estimated DC date:Not sure ? ? ?Consultants: GI, radiology, surgery ? ?Procedures: Embolization ? ?Antimicrobials:  ?Anti-infectives (From admission, onward)  ? ? None  ? ?  ? ? ?Subjective: ?Patient seen and examined at the bedside this morning.  Hemodynamically stable.  Denies any hematochezia or melena.  No bowel movement since last few days.  Denies any abdominal pain.  Passing gas ? ?Objective: ?Vitals:  ? 05/28/21 0600 05/28/21 0615 05/28/21 0629 05/28/21 7628  ?BP: 135/79  (!) 135/91 136/86  ?Pulse: 68 71 72 84  ?Resp: '14 15 16 19  '$ ?Temp:   98.9 ?F (37.2 ?C) 98.9 ?F (37.2 ?C)  ?TempSrc:   Oral   ?SpO2: 100% 100% 100% 99%  ?Weight:      ?Height:      ? ? ?Intake/Output Summary (Last 24 hours) at 05/28/2021 0739 ?Last data filed at 05/28/2021 3151 ?Gross per 24 hour  ?Intake 973.33 ml  ?Output 1500 ml  ?Net -526.67 ml  ? ?Filed Weights  ? 05/26/21 0644 05/26/21 1200 05/28/21 0447  ?Weight: 86.2 kg 91.3 kg 88.6 kg  ? ? ?Examination: ? ?General exam: Overall comfortable, not in distress, obese ?HEENT: PERRL ?Respiratory system:  no wheezes or crackles  ?Cardiovascular system: S1 & S2 heard, RRR.  ?Gastrointestinal system: Abdomen is nondistended, soft  and nontender. ?Central nervous system: Alert and oriented ?Extremities: No edema, no clubbing ,no cyanosis ?Skin: No rashes, no ulcers,no icterus   ? ? ?Data Reviewed: I have personally reviewed following labs and imaging studies ? ?CBC: ?Recent Labs  ?Lab 05/26/21 ?4259 05/26/21 ?5638 05/26/21 ?1658 05/26/21 ?2151 05/27/21 ?0421 05/27/21 ?1647 05/28/21 ?7564  ?WBC 7.9  --  7.9   --  9.1 11.3* 9.0  ?NEUTROABS 5.9  --  6.8  --   --   --   --   ?HGB 7.7*   < > 6.1* 9.4* 8.3* 7.9* 6.9*  ?HCT 24.6*   < > 18.3* 27.7* 25.2* 23.8* 20.6*  ?MCV 94.6  --  89.3  --  87.8 89.1 89.2  ?PLT 76*  --  44*  --  50* 55* 46*  ? < > = values in this interval not displayed.  ? ?Basic Metabolic Panel: ?Recent Labs  ?Lab 05/26/21 ?0650 05/26/21 ?3329 05/27/21 ?0421 05/28/21 ?5188  ?NA 141 141 139 140  ?K 3.6 3.7 4.1 3.4*  ?CL 110 106 109 111  ?CO2 21*  --  20* 24  ?GLUCOSE 151* 154* 129* 108*  ?BUN 21 23 24* 21  ?CREATININE 1.37* 1.30* 1.69* 1.36*  ?CALCIUM 8.5*  --  7.8* 8.0*  ?PHOS  --   --  3.9  --   ? ? ? ?Recent Results (from the past 240 hour(s))  ?MRSA Next Gen by PCR, Nasal     Status: None  ? Collection Time: 05/26/21 12:01 PM  ? Specimen: Nasal Mucosa; Nasal Swab  ?Result Value Ref Range Status  ? MRSA by PCR Next Gen NOT DETECTED NOT DETECTED Final  ?  Comment: (NOTE) ?The GeneXpert MRSA Assay (FDA approved for NASAL specimens only), ?is one component of a comprehensive MRSA colonization surveillance ?program. It is not intended to diagnose MRSA infection nor to guide ?or monitor treatment for MRSA infections. ?Test performance is not FDA approved in patients less than 2 years ?old. ?Performed at [facility], [place] [address], [place], [place] ?[address] ?  ?  ? ?Radiology Studies: ?DG Abd 1 View ? ?Result Date: 05/26/2021 ?CLINICAL DATA:  Evaluate for bleeding EXAM: ABDOMEN - 1 VIEW COMPARISON:  CT done on 05/11/2021 FINDINGS: There is contrast in the urinary bladder from previous contrast administration. There is a large caliber catheter in the course of left femoral and iliac vessels. There are vascular coils superimposed over the left upper portion of sacrum. There is no extravasation of contrast from the urinary bladder. There are no abnormal pockets of contrast in the lower abdomen and pelvis. Severe degenerative changes are noted in the right hip. Degenerative changes are noted in the  visualized lower lumbar spine. IMPRESSION: Urinary bladder is filled with contrast with no sign of extravasation. There are vascular coils overlying the upper aspect of left side of sacrum adjacent to the tip of a vascular catheter. Electronically Signed   By: Elmer Picker M.D.   On: 05/26/2021 14:56  ? ?IR Angiogram Visceral Selective ? ?Result Date: 05/26/2021 ?INDICATION: 71 year old male with a history bright red blood per rectum with presumed lower GI hemorrhage based on CT angiogram of 05/11/2021. He presents for intention to treat a presumed lower GI hemorrhage with angiogram and embolization. EXAM: ULTRASOUND-GUIDED ACCESS RIGHT COMMON FEMORAL ARTERY MESENTERIC ANGIOGRAM OF IMA SUPER SELECTIVE CATHETER PLACEMENT OF LEFT COLIC ARTERY AND SUPERIOR RECTAL ARTERIES COIL EMBOLIZATION OF THE MARGINAL ARTERY WATERSHED REGION AFFECTING THE VASA RECTA AS THE  SOURCE OF ACUTE SIGMOID COLON HEMORRHAGE IMAGE GUIDED PLACEMENT OF LEFT COMMON FEMORAL VEIN CENTRAL VENOUS CATHETER MEDICATIONS: None ANESTHESIA/SEDATION: Moderate (conscious) sedation was employed during this procedure. A total of Versed 1.0 mg and Fentanyl 25 mcg was administered intravenously by the radiology nurse. Total intra-service moderate Sedation Time: 81.5 minutes. The patient's level of consciousness and vital signs were monitored continuously by radiology nursing throughout the procedure under my direct supervision. CONTRAST:  60 cc Omni 300 FLUOROSCOPY: Radiation Exposure Index (as provided by the fluoroscopic device): 0,263 mGy Kerma COMPLICATIONS: None immediate. PROCEDURE: Informed consent was obtained from the patient following explanation of the procedure, risks, benefits and alternatives. The patient understands, agrees and consents for the procedure. All questions were addressed. A time out was performed prior to the initiation of the procedure. Maximal barrier sterile technique utilized including caps, mask, sterile gowns, sterile  gloves, large sterile drape, hand hygiene, and Betadine prep. Ultrasound survey of the right inguinal region was performed with images stored and sent to PACs, confirming patency of the vessel. A micropuncture needl

## 2021-05-28 NOTE — Telephone Encounter (Signed)
Call received from patient to inform [name] that he was admitted on 05/26/21 for GI bleed.  [name] notified.  ?

## 2021-05-28 NOTE — Progress Notes (Signed)
Per Blood bank staff, pt received Platelets yesterday in ICU.  MD notified ?

## 2021-05-28 NOTE — Progress Notes (Signed)
eLink Physician-Brief Progress Note ?Patient Name: BRANDY KABAT ?DOB: 1950/12/12 ?MRN: [id number] ? ? ?Date of Service ? 05/28/2021  ?HPI/Events of Note ? Patient admitted with acute GI bleeding but not currently actively bleeding, hemoglobin 6.9 gm / dl.  ?eICU Interventions ? Order to transfuse one unit of PRBC entered.  ? ? ? ?  ? ?Kerry Kass Danaria Larsen ?05/28/2021, 4:06 AM ?

## 2021-05-28 NOTE — TOC Initial Note (Signed)
Transition of Care (TOC) - Initial/Assessment Note  ? ? ?Patient Details  ?Name: Lonnie Hansen ?MRN: [id number] ?Date of Birth: August 26, 1950 ? ?Transition of Care [facility]) CM/SW Contact:    ?Sharin Mons, RN ?Phone Number: ?05/28/2021, 3:25 PM ? ?Clinical Narrative:                 ? ?Readmitted with rectal bleeding. ?Recent hospitalization for diverticular bleed 4/15- 4/19. From home with spouse. ? ?Transition of Care Department [facility]) has reviewed patient and no TOC needs have been identified at this time. We will continue to monitor patient advancement through interdisciplinary progression rounds. If new patient transition needs arise, please place a TOC consult. ?  ? ? ? ? ?Expected Discharge Plan: Home/Self Care ?Barriers to Discharge: Continued Medical Work up ? ? ?Patient Goals and CMS Choice ?  ?  ?  ? ?Expected Discharge Plan and Services ?Expected Discharge Plan: Home/Self Care ?  ?  ?  ?Living arrangements for the past 2 months: [place] ?                ?  ?  ?  ?  ?  ?  ?  ?  ?  ?  ? ?Prior Living Arrangements/Services ?Living arrangements for the past 2 months: [place] ?Lives with:: Spouse ?Patient language and need for interpreter reviewed:: Yes ?       ?Need for Family Participation in Patient Care: Yes (Comment) ?Care giver support system in place?: Yes (comment) ?  ?Criminal Activity/Legal Involvement Pertinent to Current Situation/Hospitalization: No - Comment as needed ? ?Activities of Daily Living ?Home Assistive Devices/Equipment: Kasandra Knudsen (specify quad or straight), Eyeglasses ?ADL Screening (condition at time of admission) ?Patient's cognitive ability adequate to safely complete daily activities?: Yes ?Is the patient deaf or have difficulty hearing?: No ?Does the patient have difficulty seeing, even when wearing glasses/contacts?: No ?Does the patient have difficulty concentrating, remembering, or making decisions?: No ?Patient able to express need for assistance with ADLs?:  Yes ?Does the patient have difficulty dressing or bathing?: No ?Independently performs ADLs?: Yes (appropriate for developmental age) ?Does the patient have difficulty walking or climbing stairs?: No ?Weakness of Legs: None ?Weakness of Arms/Hands: None ? ?Permission Sought/Granted ?  ?  ?   ?   ?   ?   ? ?Emotional Assessment ?  ?  ?  ?Orientation: : Oriented to Self, Oriented to Place, Oriented to  Time, Oriented to Situation ?Alcohol / Substance Use: Not Applicable ?Psych Involvement: No (comment) ? ?Admission diagnosis:  GI bleed [K92.2] ?Lower GI bleed [K92.2] ?Patient Active Problem List  ? Diagnosis Date Noted  ? GI bleed 05/26/2021  ? Diverticulosis of intestine with bleeding 05/10/2021  ? Cholecystitis 12/18/2020  ? Hemorrhagic shock ([place])   ? Cardiac arrest [facility])   ? GIB (gastrointestinal bleeding) 05/24/2020  ? Acute blood loss anemia 04/14/2019  ? Unilateral primary osteoarthritis, 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  ? 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  ? OSA on CPAP 03/04/2017  ? Vocal cord nodule   ? Nocturia   ? Joint swelling   ? Joint pain   ? Hypertension   ? History of kidney stones   ? History of colon polyps   ? Headache   ? Glaucoma   ? GERD (gastroesophageal reflux disease)   ? Enlarged prostate   ? Enlarged aorta (HCC)   ? Diverticulosis   ? Coronary artery disease   ? Cataracts, bilateral   ?  Bruising   ? Blood dyscrasia   ? Arthritis   ? Anemia   ? Hydropneumothorax 10/29/2016  ? S/P AVR 09/15/2016  ? Coronary artery disease involving native coronary artery of native heart without angina pectoris 04/29/2016  ? Aortic insufficiency 04/01/2016  ? Thoracic aortic aneurysm ([place]) 09/18/2015  ? HTN (hypertension) 09/18/2015  ? Hyperlipidemia 09/18/2015  ? Asymptomatic bilateral carotid artery stenosis 08/27/2015  ? ?PCP:  [name], MD ?Pharmacy:   ?[facility] [place] ?[address] [place] ?[place] [address] ?Phone:  [phone] Fax: [phone] ? ? ? ? ?Social Determinants of Health (SDOH) Interventions ?  ? ?Readmission Risk Interventions ?   ? View : No data to display.  ?  ?  ?  ? ? ? ?

## 2021-05-28 NOTE — Progress Notes (Signed)
[facility] ADULT ICU REPLACEMENT PROTOCOL ? ? ?The patient does apply for the [facility] Adult ICU Electrolyte Replacment Protocol based on the criteria listed below:  ? ?1.Exclusion criteria: TCTS patients, ECMO patients, and Dialysis patients ?2. Is GFR >/= 30 ml/min? Yes.    ?Patient's GFR today is 56 ?3. Is SCr </= 2? No. ?Patient's SCr is 1.36 mg/dL ?4. Did SCr increase >/= 0.5 in 24 hours? No. ?5.Pt's weight >40kg  Yes.   ?6. Abnormal electrolyte(s):   K 3.4  ?7. Electrolytes replaced per protocol ?8.  Call MD STAT for K+ </= 2.5, Phos </= 1, or Mag </= 1 ?Physician:  Wyn Forster ? ?Lonnie Hansen 05/28/2021 4:25 AM ? ?

## 2021-05-28 NOTE — Plan of Care (Signed)
  Problem: Education: Goal: Knowledge of General Education information will improve Description Including pain rating scale, medication(s)/side effects and non-pharmacologic comfort measures Outcome: Progressing   

## 2021-05-28 NOTE — Progress Notes (Signed)
[facility] Progress Note ? ?TILFORD DEATON 71 y.o. October 15, 1950 ? ? ?Subjective: ?Complaining of foul gas. Denies BMs or rectal bleeding overnight. Denies abdominal pain. Tolerating clear liquids. ? ?Objective: ?Vital signs: ?Vitals:  ? 05/28/21 0702 05/28/21 0803  ?BP: 136/86 128/75  ?Pulse: 84 72  ?Resp: 19 18  ?Temp: 98.9 ?F (37.2 ?C) 98.6 ?F (37 ?C)  ?SpO2: 99% 99%  ? ? ?Physical Exam: ?Gen: lethargic, well-nourished, no acute distress, pleasant ?HEENT: anicteric sclera ?CV: RRR ?Chest: CTA B ?Abd: soft, nontender, nondistended, +BS ?Ext: no edema ? ?Lab Results: ?Recent Labs  ?  05/27/21 ?0421 05/28/21 ?0981  ?NA 139 140  ?K 4.1 3.4*  ?CL 109 111  ?CO2 20* 24  ?GLUCOSE 129* 108*  ?BUN 24* 21  ?CREATININE 1.69* 1.36*  ?CALCIUM 7.8* 8.0*  ?PHOS 3.9  --   ? ?Recent Labs  ?  05/26/21 ?0650 05/27/21 ?0421  ?AST 19  --   ?ALT 10  --   ?ALKPHOS 29*  --   ?BILITOT 0.6  --   ?PROT 5.8*  --   ?ALBUMIN 3.6 2.9*  ? ?Recent Labs  ?  05/26/21 ?0650 05/26/21 ?0657 05/26/21 ?1658 05/26/21 ?2151 05/27/21 ?1647 05/28/21 ?0218  ?WBC 7.9  --  7.9   < > 11.3* 9.0  ?NEUTROABS 5.9  --  6.8  --   --   --   ?HGB 7.7*   < > 6.1*   < > 7.9* 6.9*  ?HCT 24.6*   < > 18.3*   < > 23.8* 20.6*  ?MCV 94.6  --  89.3   < > 89.1 89.2  ?PLT 76*  --  44*   < > 55* 46*  ? < > = values in this interval not displayed.  ? ? ? ? ?Assessment/Plan: ?DIverticular bleed - s/p embolization 05/26/21. Hgb 6.9 but no overt bleeding. S/P transfusion of 1 unit today following the 6.9. Post-transfusion H/H ordered. If Hgb responds to the transfusion consider full liquids otherwise remain on clear liquids. If active bleeding develops, then will need surgery. [facility] will sign off. Call if questions. ? ? ?[name] ?05/28/2021, 10:19 AM ? ?Questions please call [phone] Patient ID: WALTHER SANAGUSTIN, male   DOB: 20-Jan-1951, 71 y.o.   MRN: [id number] ? ?

## 2021-05-28 NOTE — Progress Notes (Signed)
Progress Note: General Surgery Service  ? ?Chief Complaint/Subjective: ?No bloody BMs. No nausea/vomiting. Tolerating CLD. No BM yet. Having flatus.  ? ? ?Objective: ?Vital signs in last 24 hours: ?Temp:  [97.8 ?F (36.6 ?C)-99.1 ?F (37.3 ?C)] 98.6 ?F (37 ?C) (05/03 1583) ?Pulse Rate:  [68-100] 72 (05/03 0803) ?Resp:  [14-20] 18 (05/03 0803) ?BP: (108-142)/(72-96) 128/75 (05/03 0803) ?SpO2:  [91 %-100 %] 99 % (05/03 0803) ?Weight:  [88.6 kg] 88.6 kg (05/03 0447) ?Last BM Date : 05/26/21 ? ?Intake/Output from previous day: ?05/02 0701 - 05/03 0700 ?In: 973.3 [P.O.:600; Blood:373.3] ?Out: 1500 [Urine:1500] ?Intake/Output this shift: ?No intake/output data recorded. ? ?Constitutional: NAD; conversant; no deformities ?Lungs: Normal respiratory effort ?CV: RRR; no palpable thrills; no pitting edema ?GI: Abd Soft, nontender; no palpable hepatosplenomegaly ?Psychiatric: Appropriate affect; alert and oriented x3 ? ?Lab Results: ?CBC  ?Recent Labs  ?  05/27/21 ?1647 05/28/21 ?0218  ?WBC 11.3* 9.0  ?HGB 7.9* 6.9*  ?HCT 23.8* 20.6*  ?PLT 55* 46*  ? ?BMET ?Recent Labs  ?  05/27/21 ?0421 05/28/21 ?0218  ?NA 139 140  ?K 4.1 3.4*  ?CL 109 111  ?CO2 20* 24  ?GLUCOSE 129* 108*  ?BUN 24* 21  ?CREATININE 1.69* 1.36*  ?CALCIUM 7.8* 8.0*  ? ?PT/INR ?Recent Labs  ?  05/26/21 ?0650  ?LABPROT 15.1  ?INR 1.2  ? ?ABG ?No results for input(s): PHART, HCO3 in the last 72 hours. ? ?Invalid input(s): PCO2, PO2 ? ?Anti-infectives: ?Anti-infectives (From admission, onward)  ? ? None  ? ?  ? ? ?Medications: ?Scheduled Meds: ? sodium chloride   Intravenous Once  ? sodium chloride   Intravenous Once  ? Chlorhexidine Gluconate Cloth  6 each Topical Daily  ? latanoprost  1 drop Both Eyes QHS  ? mouth rinse  15 mL Mouth Rinse BID  ? pantoprazole  40 mg Oral BID  ? ?Continuous Infusions: ? sodium chloride Stopped (05/26/21 0743)  ? sodium chloride Stopped (05/26/21 0936)  ? potassium chloride 10 mEq (05/28/21 0843)  ? ?PRN  Meds:.ondansetron ? ?Assessment/Plan: ?Lonnie Hansen is a 71 year old male s/p IR embolization of diverticular bleed.   ? ?Diverticular bleeding ?- Controlled in IR 05/26/21 ?- advance to [place] and monitor  ?- Monitor for ischemia progressing to perforation that may still require colectomy ?- Surgery team will follow ?- getting blood today for hgb 6.9 ? ? LOS: 2 days  ? ? ?Jill Alexanders, PA-C ? ?[facility] ?Use [email or web address] to contact on call provider ? ? ? ?

## 2021-05-28 NOTE — Plan of Care (Signed)

## 2021-05-29 ENCOUNTER — Encounter (HOSPITAL_COMMUNITY): Payer: Self-pay | Admitting: Internal Medicine

## 2021-05-29 LAB — TYPE AND SCREEN
ABO/RH(D): AB POS
Antibody Screen: NEGATIVE
Unit division: 0
Unit division: 0
Unit division: 0
Unit division: 0
Unit division: 0
Unit division: 0
Unit division: 0

## 2021-05-29 LAB — BPAM RBC
Blood Product Expiration Date: 202305162359
Blood Product Expiration Date: 202305162359
Blood Product Expiration Date: 202305162359
Blood Product Expiration Date: 202305162359
Blood Product Expiration Date: 202305292359
Blood Product Expiration Date: 202305302359
Blood Product Expiration Date: 202306062359
ISSUE DATE / TIME: 202305010717
ISSUE DATE / TIME: 202305010717
ISSUE DATE / TIME: 202305010924
ISSUE DATE / TIME: 202305010924
ISSUE DATE / TIME: 202305011622
ISSUE DATE / TIME: 202305011822
ISSUE DATE / TIME: 202305030428
Unit Type and Rh: 5100
Unit Type and Rh: 5100
Unit Type and Rh: 6200
Unit Type and Rh: 6200
Unit Type and Rh: 8400
Unit Type and Rh: 8400
Unit Type and Rh: 8400

## 2021-05-29 LAB — CBC
HCT: 25.1 % — ABNORMAL LOW (ref 39.0–52.0)
Hemoglobin: 8.2 g/dL — ABNORMAL LOW (ref 13.0–17.0)
MCH: 29.3 pg (ref 26.0–34.0)
MCHC: 32.7 g/dL (ref 30.0–36.0)
MCV: 89.6 fL (ref 80.0–100.0)
Platelets: 58 10*3/uL — ABNORMAL LOW (ref 150–400)
RBC: 2.8 MIL/uL — ABNORMAL LOW (ref 4.22–5.81)
RDW: 15.3 % (ref 11.5–15.5)
WBC: 7.6 10*3/uL (ref 4.0–10.5)
nRBC: 0.5 % — ABNORMAL HIGH (ref 0.0–0.2)

## 2021-05-29 LAB — BASIC METABOLIC PANEL
Anion gap: 5 (ref 5–15)
BUN: 10 mg/dL (ref 8–23)
CO2: 27 mmol/L (ref 22–32)
Calcium: 8.2 mg/dL — ABNORMAL LOW (ref 8.9–10.3)
Chloride: 106 mmol/L (ref 98–111)
Creatinine, Ser: 1.1 mg/dL (ref 0.61–1.24)
GFR, Estimated: >60 mL/min (ref >60)
Glucose, Bld: 101 mg/dL — ABNORMAL HIGH (ref 70–99)
Potassium: 3.5 mmol/L (ref 3.5–5.1)
Sodium: 138 mmol/L (ref 135–145)

## 2021-05-29 LAB — HEMOGLOBIN AND HEMATOCRIT, BLOOD
HCT: 23.1 % — ABNORMAL LOW (ref 39.0–52.0)
HCT: 24.2 % — ABNORMAL LOW (ref 39.0–52.0)
Hemoglobin: 7.9 g/dL — ABNORMAL LOW (ref 13.0–17.0)
Hemoglobin: 7.8 g/dL — ABNORMAL LOW (ref 13.0–17.0)

## 2021-05-29 MED ORDER — ROSUVASTATIN CALCIUM 20 MG PO TABS
20.0000 mg | ORAL_TABLET | Freq: Every day | ORAL | Status: DC
  Administered 2021-05-29 – 2021-05-30 (×2): 20 mg via ORAL

## 2021-05-29 NOTE — Plan of Care (Signed)

## 2021-05-29 NOTE — Progress Notes (Addendum)
?PROGRESS NOTE ? ?Lonnie Hansen  GDJ:[id number] DOB: 1951-01-20 DOA: 05/26/2021 ?PCP: [name], MD  ? ?Brief Narrative: ? ?Patient is a 71 year old male with history of abdominal aortic aneurysm, aortic valve replacement(porcine) chronic ITP, GERD, hyperlipidemia, OSA on CPAP, hypertension, diverticular bleed who presents with bloody stools.  He was recently hospitalized for diverticular bleed in April, seen by GI, IR, surgery, at that time bleeding resolved without any need for intervention.  Patient was discharged home but started having bloody bowel movements, associated dizziness with near syncope.  On presentation he was found to be anemic with hemoglobin in the range of 7, thrombocytopenia.  Transfused 2 units of PRBC here in the emergency department for continuous active rectal bleeding.  PCCM, IR, general surgery consulted.  Underwent coil embolization of the marginal artery supplying the vasa recta contributing to sigmoid colon hemorrhage on 5/1.  Hemoglobin has remained stable since last 24 hours, no report of hematochezia melena.  Possible discharge tomorrow if hemoglobin remains stable ? ?Assessment & Plan: ? ?Lower GI bleed: Presented with recurrent bloody stool/hematochezia.  Diverticular bleed suspected.  GI, general surgery, IR consulted.  Transfused with total of 3 units of PRBCs during this hospitalization for active rectal bleeding.  Underwent coil embolization of the marginal artery supplying the vasa recta contributing to sigmoid colon hemorrhage.  General surgery closely following for the need of hemicolectomy if further bleeding occurs.  No complaint of hematochezia/malena currently. ? ?Acute blood loss anemia: Due to lower GI bleed.  Hemoglobin dropped to the range of 6.9 on 5/3. Transfused with a unit of  PRBC.  Hemoglobin stable in the range of 7.9. ? ?Acute on chronic ITP: Has chronic thrombocytopenia.  Transfused with a unit of platelets on 5/3.  Follows with [name] for  infusion of Romiplostim, last infusion on 3/31.  Stable ? ?AKI: resolved ? ?Hypokalemia: Supplemented with potassium and corrected ? ?GERD: Continue PPI ? ?Hypertension: Antihypertensives on hold due to GI bleed.  BP stable.  Can resume on discharge ? ?Hyperlipidemia :on Crestor at home ? ?OSA: On CPAP at night ? ?Obesity: BMI of 31.5 ? ? ?  ?  ?  ? ?DVT prophylaxis:SCDs Start: 05/26/21 0911 ? ? ?  Code Status: Full Code ? ?Family Communication: None at bedside, patient states there is no need to call anybody. ? ?Patient status:Inpatient ? ?Patient is from :Home ? ?Anticipated discharge QQ:IWLN ? ?Estimated DC date: Likely tomorrow if no further bleeding ? ? ?Consultants: GI, radiology, surgery ? ?Procedures: Embolization ? ?Antimicrobials:  ?Anti-infectives (From admission, onward)  ? ? None  ? ?  ? ? ?Subjective: ?Patient seen and examined at the bedside this morning.  Very comfortable.  Eating his breakfast.  No abdominal pain, nausea or vomiting.  No hematochezia or melena.  Passing gas but no bowel movement since last few days. ? ?Objective: ?Vitals:  ? 05/28/21 1542 05/28/21 1916 05/29/21 0416 05/29/21 0819  ?BP:  118/81 130/72 132/80  ?Pulse:  74 69 66  ?Resp:  '20 16 16  '$ ?Temp:  98.4 ?F (36.9 ?C) 98.1 ?F (36.7 ?C) 97.9 ?F (36.6 ?C)  ?TempSrc:    Oral  ?SpO2: 99% 100% 99% 100%  ?Weight:      ?Height:      ? ? ?Intake/Output Summary (Last 24 hours) at 05/29/2021 1110 ?Last data filed at 05/29/2021 0700 ?Gross per 24 hour  ?Intake 480 ml  ?Output 1500 ml  ?Net -1020 ml  ? ?Filed Weights  ? 05/26/21 [phone]  05/26/21 1200 05/28/21 0447  ?Weight: 86.2 kg 91.3 kg 88.6 kg  ? ? ?Examination: ? ?General exam: Overall comfortable, not in distress,obese ?HEENT: PERRL ?Respiratory system:  no wheezes or crackles  ?Cardiovascular system: S1 & S2 heard, RRR.  ?Gastrointestinal system: Abdomen is nondistended, soft and nontender. ?Central nervous system: Alert and oriented ?Extremities: No edema, no clubbing ,no cyanosis ?Skin: No  rashes, no ulcers,no icterus    ? ? ?Data Reviewed: I have personally reviewed following labs and imaging studies ? ?CBC: ?Recent Labs  ?Lab 05/26/21 ?8242 05/26/21 ?3536 05/26/21 ?1658 05/26/21 ?2151 05/27/21 ?0421 05/27/21 ?1647 05/28/21 ?1443 05/28/21 ?1126 05/28/21 ?1854 05/29/21 ?1540 05/29/21 ?1044  ?WBC 7.9  --  7.9  --  9.1 11.3* 9.0  --   --   --   --   ?NEUTROABS 5.9  --  6.8  --   --   --   --   --   --   --   --   ?HGB 7.7*   < > 6.1*   < > 8.3* 7.9* 6.9* 7.7* 8.0* 7.9* 7.8*  ?HCT 24.6*   < > 18.3*   < > 25.2* 23.8* 20.6* 22.9* 24.3* 23.1* 24.2*  ?MCV 94.6  --  89.3  --  87.8 89.1 89.2  --   --   --   --   ?PLT 76*  --  44*  --  50* 55* 46*  --   --   --   --   ? < > = values in this interval not displayed.  ? ?Basic Metabolic Panel: ?Recent Labs  ?Lab 05/26/21 ?0867 05/26/21 ?6195 05/27/21 ?0421 05/28/21 ?0932 05/29/21 ?6712  ?NA 141 141 139 140 138  ?K 3.6 3.7 4.1 3.4* 3.5  ?CL 110 106 109 111 106  ?CO2 21*  --  20* 24 27  ?GLUCOSE 151* 154* 129* 108* 101*  ?BUN 21 23 24* 21 10  ?CREATININE 1.37* 1.30* 1.69* 1.36* 1.10  ?CALCIUM 8.5*  --  7.8* 8.0* 8.2*  ?PHOS  --   --  3.9  --   --   ? ? ? ?Recent Results (from the past 240 hour(s))  ?MRSA Next Gen by PCR, Nasal     Status: None  ? Collection Time: 05/26/21 12:01 PM  ? Specimen: Nasal Mucosa; Nasal Swab  ?Result Value Ref Range Status  ? MRSA by PCR Next Gen NOT DETECTED NOT DETECTED Final  ?  Comment: (NOTE) ?The GeneXpert MRSA Assay (FDA approved for NASAL specimens only), ?is one component of a comprehensive MRSA colonization surveillance ?program. It is not intended to diagnose MRSA infection nor to guide ?or monitor treatment for MRSA infections. ?Test performance is not FDA approved in patients less than 2 years ?old. ?Performed at [facility], [place] [address], [place], [place] ?[address] ?  ?  ? ?Radiology Studies: ?No results found. ? ?Scheduled Meds: ? sodium chloride   Intravenous Once  ? sodium chloride   Intravenous Once  ?  Chlorhexidine Gluconate Cloth  6 each Topical Daily  ? fluticasone  1 spray Each Nare Daily  ? latanoprost  1 drop Both Eyes BID  ? mouth rinse  15 mL Mouth Rinse BID  ? pantoprazole  40 mg Oral BID  ? rosuvastatin  20 mg Oral Daily  ? ?Continuous Infusions: ? sodium chloride Stopped (05/26/21 0743)  ? sodium chloride Stopped (05/26/21 0936)  ? ? ? LOS: 3 days  ? ?Shelly Coss, MD ?[facility] ?P5/04/2021, 11:10 AM   ?

## 2021-05-29 NOTE — Progress Notes (Signed)
Progress Note: General Surgery Service  ? ?Chief Complaint/Subjective: ?Ongoing flatus, no BM. States he is hungry. Tolerating CLD.  ? ? ?Objective: ?Vital signs in last 24 hours: ?Temp:  [97.7 ?F (36.5 ?C)-98.4 ?F (36.9 ?C)] 97.9 ?F (36.6 ?C) (05/04 2595) ?Pulse Rate:  [66-74] 66 (05/04 0819) ?Resp:  [16-20] 16 (05/04 0819) ?BP: (118-132)/(72-81) 132/80 (05/04 0819) ?SpO2:  [99 %-100 %] 100 % (05/04 0819) ?Last BM Date : 05/26/21 ? ?Intake/Output from previous day: ?05/03 0701 - 05/04 0700 ?In: 480 [P.O.:480] ?Out: 1800 [Urine:1800] ?Intake/Output this shift: ?No intake/output data recorded. ? ?Constitutional: NAD; conversant; no deformities ?Lungs: Normal respiratory effort ?CV: RRR; no palpable thrills; no pitting edema ?GI: Abd Soft, nontender; no palpable hepatosplenomegaly ?Psychiatric: Appropriate affect; alert and oriented x3 ? ?Lab Results: ?CBC  ?Recent Labs  ?  05/27/21 ?6387 05/28/21 ?0218 05/28/21 ?1126 05/28/21 ?1854 05/29/21 ?5643  ?WBC 11.3* 9.0  --   --   --   ?HGB 7.9* 6.9*   < > 8.0* 7.9*  ?HCT 23.8* 20.6*   < > 24.3* 23.1*  ?PLT 55* 46*  --   --   --   ? < > = values in this interval not displayed.  ? ?BMET ?Recent Labs  ?  05/28/21 ?0218 05/29/21 ?0218  ?NA 140 138  ?K 3.4* 3.5  ?CL 111 106  ?CO2 24 27  ?GLUCOSE 108* 101*  ?BUN 21 10  ?CREATININE 1.36* 1.10  ?CALCIUM 8.0* 8.2*  ? ?PT/INR ?No results for input(s): LABPROT, INR in the last 72 hours. ? ?ABG ?No results for input(s): PHART, HCO3 in the last 72 hours. ? ?Invalid input(s): PCO2, PO2 ? ?Anti-infectives: ?Anti-infectives (From admission, onward)  ? ? None  ? ?  ? ? ?Medications: ?Scheduled Meds: ? sodium chloride   Intravenous Once  ? sodium chloride   Intravenous Once  ? Chlorhexidine Gluconate Cloth  6 each Topical Daily  ? fluticasone  1 spray Each Nare Daily  ? latanoprost  1 drop Both Eyes BID  ? mouth rinse  15 mL Mouth Rinse BID  ? pantoprazole  40 mg Oral BID  ? rosuvastatin  20 mg Oral Daily  ? ?Continuous Infusions: ? sodium  chloride Stopped (05/26/21 0743)  ? sodium chloride Stopped (05/26/21 0936)  ? ?PRN Meds:.ondansetron ? ?Assessment/Plan: ?Mr. Berisha is a 71 year old male s/p IR embolization of diverticular bleed.   ? ?Diverticular bleeding ?- Controlled in IR 05/26/21 ?- advance to soft diet and monitor  ?- Monitor for ischemia progressing to perforation that may still require colectomy ?- Surgery team will follow ?- hgb stable this AM 7.9 from 8.0, got 1 u pRBC yesterday 5/3 for hgb 6.9 ? ? LOS: 3 days  ? ? ?Jill Alexanders, PA-C ? ?[facility] ?Use [email or web address] to contact on call provider ? ? ? ?

## 2021-05-29 NOTE — Care Management Important Message (Signed)
Important Message ? ?Patient Details  ?Name: Lonnie Hansen ?MRN: [id number] ?Date of Birth: 1950-08-18 ? ? ?Medicare Important Message Given:  Yes ? ? ? ? ?[name] ?05/29/2021, 3:27 PM ?

## 2021-05-29 NOTE — Progress Notes (Signed)
Mobility Specialist Progress Note  ? ? 05/29/21 1645  ?Mobility  ?Activity Ambulated with assistance in hallway  ?Level of Assistance Modified independent, requires aide device or extra time  ?Assistive Device Front wheel walker  ?Distance Ambulated (ft) 600 ft  ?Activity Response Tolerated well  ?$Mobility charge 1 Mobility  ? ?Pt received in bed and agreeable. No complaints on walk. Returned to bed with call bell in reach.   ? ?Hildred Alamin ?Mobility Specialist  ?Primary: 5N M.S. Phone: [phone] ?Secondary: 6[address] M[address]S. Phone: [phone] ?  ?

## 2021-05-29 NOTE — Progress Notes (Signed)
Pt is requesting a diet change. Reached out to all consulting MDs about pt request.  ?

## 2021-05-30 DIAGNOSIS — I10 Essential (primary) hypertension: Secondary | ICD-10-CM

## 2021-05-30 DIAGNOSIS — Z8679 Personal history of other diseases of the circulatory system: Secondary | ICD-10-CM

## 2021-05-30 DIAGNOSIS — K219 Gastro-esophageal reflux disease without esophagitis: Secondary | ICD-10-CM

## 2021-05-30 DIAGNOSIS — Z9989 Dependence on other enabling machines and devices: Secondary | ICD-10-CM

## 2021-05-30 DIAGNOSIS — E785 Hyperlipidemia, unspecified: Secondary | ICD-10-CM

## 2021-05-30 DIAGNOSIS — G4733 Obstructive sleep apnea (adult) (pediatric): Secondary | ICD-10-CM

## 2021-05-30 DIAGNOSIS — K5790 Diverticulosis of intestine, part unspecified, without perforation or abscess without bleeding: Secondary | ICD-10-CM

## 2021-05-30 LAB — CBC
HCT: 23.1 % — ABNORMAL LOW (ref 39.0–52.0)
Hemoglobin: 7.5 g/dL — ABNORMAL LOW (ref 13.0–17.0)
MCH: 29.2 pg (ref 26.0–34.0)
MCHC: 32.5 g/dL (ref 30.0–36.0)
MCV: 89.9 fL (ref 80.0–100.0)
Platelets: 58 10*3/uL — ABNORMAL LOW (ref 150–400)
RBC: 2.57 MIL/uL — ABNORMAL LOW (ref 4.22–5.81)
RDW: 15.6 % — ABNORMAL HIGH (ref 11.5–15.5)
WBC: 6.8 10*3/uL (ref 4.0–10.5)
nRBC: 0.4 % — ABNORMAL HIGH (ref 0.0–0.2)

## 2021-05-30 NOTE — Assessment & Plan Note (Signed)
Likely diverticular bleed. S/p coil embolization on 5/1.  Received a total of 3 units.  H&H eventually stable.  Tolerated soft diet.  Cleared for discharge by general surgery and GI. ?-Continue home p.o. iron ?-Recheck CBC in 1 week. ?

## 2021-05-30 NOTE — Plan of Care (Signed)

## 2021-05-30 NOTE — Assessment & Plan Note (Signed)
Stable.  Continue home meds. ?

## 2021-05-30 NOTE — Discharge Summary (Signed)
? ?Physician Discharge Summary  ?Lonnie Hansen GGE:[id number] DOB: 01/19/1951 DOA: 05/26/2021 ? ?PCP: Lonnie Lopes, MD ? ?Admit date: 05/26/2021 ?Discharge date: 05/30/2021 ?Admitted From: Home ?Disposition: Home ?Recommendations for Outpatient Follow-up:  ?Follow ups as below. ?Please obtain CBC/BMP/Mag at follow up ?Please follow up on the following pending results: None ? ?Home Health: Not indicated ?Equipment/Devices: Not indicated ? ?Discharge Condition: Stable ?CODE STATUS: Full code ? Follow-up Information   ? ? Lonnie Lopes, MD. Schedule an appointment as soon as possible for a visit in 1 week(s).   ?Specialty: Internal Medicine ?Contact information: ?[address] ?[place] [address] ?[phone] ? ? ?  ?  ? ?  ?  ? ?  ? ? ?Hospital course ?[name] with PMH of AAA, AVR (porcine), repeat, OSA on CPAP, HTN, HLD, diverticulosis and recent hospitalization for diverticular bleed last month returning with hematochezia and near syncope and admitted for symptomatic ABLA due to lower GI bleed.  Hgb 7.8 (9.3 on discharge on 4/18).  Transfused 2 unit with appropriate response.  Intensivist, interventional radiology and general surgery consulted.  Eventually, he underwent coil embolization of marginal arteries supplying the basilar rectal contributing to sigmoid colon hemorrhage on 5/1.  Hemoglobin dropped further to 6.9.  He was transfused additional 1 unit with appropriate response.  Hemoglobin remained stable since then.  He tolerated soft diet.  He was cleared for discharge by general surgery and gastroenterology for outpatient follow-up. ?  ? ?See individual problem list below for more on hospital course. ? ?Problems addressed during this hospitalization ?Problem  ?Symptomatic ABLA due to lower GI bleed  ?Chronic Itp (Idiopathic Thrombocytopenia) (Hcc)  ?Hypertension  ? takes Amlodipine and Atenolol daily ? ?  ?GERD  ? takes Pantoprazole daily as needed ? ?  ?Osa On Cpap  ?History of CAD  ?   ?Assessment and Plan: ?* Symptomatic ABLA due to lower GI bleed ?Likely diverticular bleed. S/p coil embolization on 5/1.  Received a total of 3 units.  H&H eventually stable.  Tolerated soft diet.  Cleared for discharge by general surgery and GI. ?-Continue home p.o. iron ?-Recheck CBC in 1 week. ? ?Chronic ITP (idiopathic thrombocytopenia) (HCC) ?Platelets relatively stable. ?-Follow-up with hematologist ?-Recheck CBC in 1 week ? ?Hypertension ?Patient was normotensive off home antihypertensive meds. ?Continue home atenolol. ?Discontinued amlodipine. ? ?History of CAD ?Stable. ?-Continue home meds ? ? ? ?Vital signs ?Vitals:  ? 05/29/21 1442 05/29/21 2101 05/30/21 0516 05/30/21 0721  ?BP: 118/82 (!) 149/85 131/74 139/84  ?Pulse: 78 80 64 71  ?Temp: 98.5 ?F (36.9 ?C) 99 ?F (37.2 ?C) 97.6 ?F (36.4 ?C) 98.4 ?F (36.9 ?C)  ?Resp:  '16 16 15  '$ ?Height:      ?Weight:      ?SpO2: 99% 100% 100% 98%  ?TempSrc: Oral Oral Oral Oral  ?BMI (Calculated):      ?  ? ?Discharge exam ? ?GENERAL: No apparent distress.  Nontoxic. ?HEENT: MMM.  Vision and hearing grossly intact.  ?NECK: Supple.  No apparent JVD.  ?RESP:  No IWOB.  Fair aeration bilaterally. ?CVS:  RRR. Heart sounds normal.  ?ABD/GI/GU: BS+. Abd soft, NTND.  ?MSK/EXT:  Moves extremities. No apparent deformity. No edema.  ?SKIN: no apparent skin lesion or wound ?NEURO: Awake and alert. Oriented appropriately.  No apparent focal neuro deficit. ?PSYCH: Calm. Normal affect.  ? ?Discharge Instructions ?Discharge Instructions   ? ? Call MD for:  difficulty breathing, headache or visual disturbances   Complete by:  As directed ?  ? Call MD for:  extreme fatigue   Complete by: As directed ?  ? Call MD for:  persistant dizziness or light-headedness   Complete by: As directed ?  ? Diet - low sodium heart healthy   Complete by: As directed ?  ? Continue soft diet for the next 3 to 4 days.  ? Discharge instructions   Complete by: As directed ?  ? It has been a pleasure taking care of  you! ? ?You were hospitalized due to rectal bleeding for which you have been treated surgically medically.  Eventually, you hemoglobin (blood level) remained stable.  Continue soft diet for the next 3 to 4 days.  Please follow-up with your primary care doctor and hematologist in 1 to 2 weeks.  Review your new medication list and the directions on your medications before you take them.  Review your new medication list and the directions on your medications before you take them. ? ? ?Take care,  ? Increase activity slowly   Complete by: As directed ?  ? No wound care   Complete by: As directed ?  ? ?  ? ?Allergies as of 05/30/2021   ?No Known Allergies ?  ? ?  ?Medication List  ?  ? ?STOP taking these medications   ? ?amLODipine 10 MG tablet ?Commonly known as: NORVASC ?  ?IRON PO ?  ? ?  ? ?TAKE these medications   ? ?acetaminophen 500 MG tablet ?Commonly known as: TYLENOL ?Take 1,000 mg by mouth daily as needed (pain). ?  ?Anucort-HC 25 MG suppository ?Generic drug: hydrocortisone ?Place 1 suppository rectally daily as needed for pain. ?  ?atenolol 25 MG tablet ?Commonly known as: TENORMIN ?Take 12.5 mg by mouth 2 (two) times daily. ?  ?ferrous sulfate 325 (65 FE) MG tablet ?Take 325 mg by mouth daily. ?  ?fluticasone 50 MCG/ACT nasal spray ?Commonly known as: FLONASE ?Place 2 sprays into both nostrils daily as needed for allergies or rhinitis. ?  ?latanoprost 0.005 % ophthalmic solution ?Commonly known as: XALATAN ?Place 1 drop into both eyes 2 (two) times daily. ?  ?multivitamin with minerals Tabs tablet ?Take 1 tablet by mouth daily. ?  ?pantoprazole 40 MG tablet ?Commonly known as: PROTONIX ?Take 40 mg by mouth 2 (two) times daily. ?  ?polyethylene glycol 17 g packet ?Commonly known as: MIRALAX / GLYCOLAX ?Take 17 g by mouth daily. ?What changed:  ?when to take this ?additional instructions ?  ?psyllium 95 % Pack ?Commonly known as: HYDROCIL/METAMUCIL ?Take 1 packet by mouth daily. ?  ?rosuvastatin 20 MG  tablet ?Commonly known as: CRESTOR ?Take 20 mg by mouth daily. ?What changed: Another medication with the same name was removed. Continue taking this medication, and follow the directions you see here. ?  ?triamcinolone cream 0.1 % ?Commonly known as: KENALOG ?Apply 1 application. topically 3 (three) times daily as needed (facial itching/rash). ?  ? ?  ? ? ?Consultations: ?Gastroenterology ?General surgery ?Interventional radiology ?Pulmonologist/intensivist ? ?Procedures/Studies: ?5/1-coil embolization ? ? ?DG Abd 1 View ? ?Result Date: 05/26/2021 ?CLINICAL DATA:  Evaluate for bleeding EXAM: ABDOMEN - 1 VIEW COMPARISON:  CT done on 05/11/2021 FINDINGS: There is contrast in the urinary bladder from previous contrast administration. There is a large caliber catheter in the course of left femoral and iliac vessels. There are vascular coils superimposed over the left upper portion of sacrum. There is no extravasation of contrast from the urinary bladder. There are no abnormal pockets  of contrast in the lower abdomen and pelvis. Severe degenerative changes are noted in the right hip. Degenerative changes are noted in the visualized lower lumbar spine. IMPRESSION: Urinary bladder is filled with contrast with no sign of extravasation. There are vascular coils overlying the upper aspect of left side of sacrum adjacent to the tip of a vascular catheter. Electronically Signed   By: Elmer Picker M.D.   On: 05/26/2021 14:56  ? ?IR Angiogram Visceral Selective ? ?Result Date: 05/26/2021 ?INDICATION: 71 year old male with a history bright red blood per rectum with presumed lower GI hemorrhage based on CT angiogram of 05/11/2021. He presents for intention to treat a presumed lower GI hemorrhage with angiogram and embolization. EXAM: ULTRASOUND-GUIDED ACCESS RIGHT COMMON FEMORAL ARTERY MESENTERIC ANGIOGRAM OF IMA SUPER SELECTIVE CATHETER PLACEMENT OF LEFT COLIC ARTERY AND SUPERIOR RECTAL ARTERIES COIL EMBOLIZATION OF THE  MARGINAL ARTERY WATERSHED REGION AFFECTING THE VASA RECTA AS THE SOURCE OF ACUTE SIGMOID COLON HEMORRHAGE IMAGE GUIDED PLACEMENT OF LEFT COMMON FEMORAL VEIN CENTRAL VENOUS CATHETER MEDICATIONS: None ANESTHESIA/SEDATION:

## 2021-05-30 NOTE — Assessment & Plan Note (Signed)
Platelets relatively stable. ?-Follow-up with hematologist ?-Recheck CBC in 1 week ?

## 2021-05-30 NOTE — Assessment & Plan Note (Signed)
Patient was normotensive off home antihypertensive meds. ?Continue home atenolol. ?Discontinued amlodipine. ?

## 2021-05-30 NOTE — Consult Note (Addendum)
? ?  [facility] CM Inpatient Consult ? ? ?05/30/2021 ? ?Lonnie Hansen ?January 21, 1951 ?[id number] ? ?[facility] Organization [ACO] Patient: Medicare ACO REACH ? ?Primary Care Provider:  [name], MD,  [facility] is an [facility] provider ? ?*Less than 30 days readmission ? ?Patient is currently active with [facility] for chronic disease management services.  Patient has been engaged by a [facility].  Our community based plan of care has focused on disease management and community resource support.   ? ?Patient will receive a post hospital call and will be evaluated for assessments and disease process education.   ? ?Plan Ongoing follow up with [facility] RNCM as patient transitions back home today. Will update [facility] RNCM on transition.  ? ?Of note, [facility] Care Management services does not replace or interfere with any services that are needed or arranged by inpatient [facility] care management team.  For additional questions or referrals please contact: ? ?Natividad Brood, RN BSN CCM ?[facility] Liaison ? [phone] business mobile phone ?Toll free office [phone]  ?Fax number: [phone] ?[name] ? ? ? ? ?

## 2021-05-30 NOTE — Progress Notes (Signed)
Progress Note: General Surgery Service  ? ?Chief Complaint/Subjective: ?Tolerating solid diet. States he had a BM yesterday that was mostly brown with a small amt black. No maroon/red stools. +flatus.  ? ? ?Objective: ?Vital signs in last 24 hours: ?Temp:  [97.6 ?F (36.4 ?C)-99 ?F (37.2 ?C)] 98.4 ?F (36.9 ?C) (05/05 5993) ?Pulse Rate:  [64-80] 71 (05/05 0721) ?Resp:  [15-16] 15 (05/05 0721) ?BP: (118-149)/(74-85) 139/84 (05/05 0721) ?SpO2:  [98 %-100 %] 98 % (05/05 0721) ?Last BM Date : 05/26/21 ? ?Intake/Output from previous day: ?05/04 0701 - 05/05 0700 ?In: 240 [P.O.:240] ?Out: 450 [Urine:450] ?Intake/Output this shift: ?No intake/output data recorded. ? ?Constitutional: NAD; conversant; no deformities ?Lungs: Normal respiratory effort ?CV: RRR; no palpable thrills; no pitting edema ?GI: Abd Soft, nontender; no palpable hepatosplenomegaly; groin access sites hemostatic bilaterally ?Psychiatric: Appropriate affect; alert and oriented x3 ? ?Lab Results: ?CBC  ?Recent Labs  ?  05/29/21 ?1831 05/30/21 ?0253  ?WBC 7.6 6.8  ?HGB 8.2* 7.5*  ?HCT 25.1* 23.1*  ?PLT 58* 58*  ? ?BMET ?Recent Labs  ?  05/28/21 ?0218 05/29/21 ?0218  ?NA 140 138  ?K 3.4* 3.5  ?CL 111 106  ?CO2 24 27  ?GLUCOSE 108* 101*  ?BUN 21 10  ?CREATININE 1.36* 1.10  ?CALCIUM 8.0* 8.2*  ? ?PT/INR ?No results for input(s): LABPROT, INR in the last 72 hours. ? ?ABG ?No results for input(s): PHART, HCO3 in the last 72 hours. ? ?Invalid input(s): PCO2, PO2 ? ?Anti-infectives: ?Anti-infectives (From admission, onward)  ? ? None  ? ?  ? ? ?Medications: ?Scheduled Meds: ? sodium chloride   Intravenous Once  ? sodium chloride   Intravenous Once  ? Chlorhexidine Gluconate Cloth  6 each Topical Daily  ? fluticasone  1 spray Each Nare Daily  ? latanoprost  1 drop Both Eyes BID  ? mouth rinse  15 mL Mouth Rinse BID  ? pantoprazole  40 mg Oral BID  ? rosuvastatin  20 mg Oral Daily  ? ?Continuous Infusions: ? sodium chloride Stopped (05/26/21 0743)  ? sodium chloride  Stopped (05/26/21 0936)  ? ?PRN Meds:.ondansetron ? ?Assessment/Plan: ?Lonnie Hansen is a 71 year old male s/p IR embolization of diverticular bleed.   ? ?Diverticular bleeding ?- Controlled in IR 05/26/21 ?- tolerating soft diet, had a nonbloody stool, may have a had some old blood in it.  ?- hgb has been stable 7.9 > 7.8 >8.2 > 7.5. platelets 58 from 58. Resumption of iron therapy and injections for ITP per medical service. No emergent surgical needs. Ideally he would have another normal BM before discharge but from a [facility] perspective he could be discharged home. Follow up with his PCP and hematoligst. Follow up with [facility] PRN.  ? ? ? LOS: 4 days  ? ? ?Jill Alexanders, PA-C ? ?[facility] ?Use [email or web address] to contact on call provider ? ? ? ?

## 2021-05-30 NOTE — Hospital Course (Signed)
[name] with PMH of AAA, AVR (porcine), repeat, OSA on CPAP, HTN, HLD, diverticulosis and recent hospitalization for diverticular bleed last month returning with hematochezia and near syncope and admitted for symptomatic ABLA due to lower GI bleed.  Hgb 7.8 (9.3 on discharge on 4/18).  Transfused 2 unit with appropriate response.  Intensivist, interventional radiology and general surgery consulted.  Eventually, he underwent coil embolization of marginal arteries supplying the basilar rectal contributing to sigmoid colon hemorrhage on 5/1.  Hemoglobin dropped further to 6.9.  He was transfused additional 1 unit with appropriate response.  Hemoglobin remained stable since then.  He tolerated soft diet.  He was cleared for discharge by general surgery and gastroenterology for outpatient follow-up. ? ?

## 2021-06-02 ENCOUNTER — Other Ambulatory Visit: Payer: Self-pay | Admitting: *Deleted

## 2021-06-02 NOTE — Patient Outreach (Signed)
?[facility]) Care Management ?Telephonic RN Care Manager Note ? ? ?06/02/2021 ?Name:  ZAKEE DEERMAN MRN:  [id number] DOB:  February 04, 1950 ? ?Summary: ?Outgoing call placed to member, successful.  Denies any urgent concerns, encouraged to contact this care manager with questions.   ? ?Subjective: ?Lonnie Hansen is an 71 y.o. year old male who is a primary patient of Lonnie Lopes, MD. The care management team was consulted for assistance with care management and/or care coordination needs.   ? ?Telephonic RN Care Manager completed Telephone Visit today. ? ?Objective:  ? ?Medications Reviewed Today   ? ? Reviewed by Reynold Bowen, CPhT (Pharmacy Technician) on 05/26/21 at 1039  Med List Status: Complete  ? ?Medication Order Taking? Sig Documenting Provider Last Dose Status Informant  ?acetaminophen (TYLENOL) 500 MG tablet [id number] Yes Take 1,000 mg by mouth daily as needed (pain). [provider] 05/24/2021 Active Self  ?amLODipine (NORVASC) 10 MG tablet [id number] Yes Take 10 mg by mouth every evening. [provider] 05/25/2021 Active Self  ?ANUCORT-HC 25 MG suppository [id number] Yes Place 1 suppository rectally daily as needed for pain. [provider] Past Month Active Self  ?atenolol (TENORMIN) 25 MG tablet [id number] Yes Take 12.5 mg by mouth 2 (two) times daily. Lonnie Man, MD 05/25/2021 2200 Active Self  ?Ferrous Sulfate (IRON PO) [id number] Yes Take 1 tablet by mouth daily. [provider] 05/25/2021 Active Self  ?ferrous sulfate 325 (65 FE) MG tablet [id number] No Take 325 mg by mouth daily.  ?Patient not taking: Reported on 05/26/2021  ? [provider] Not Taking Active Self  ?         ?Med Note (Hansen, [name]   Mon May 26, 2021 10:35 AM) Pt stated he does not believe he is taking this dose. Pt stated he is taking iron daily but not this dose.   ?fluticasone (FLONASE) 50 MCG/ACT nasal spray [id number] Yes Place 2 sprays into both nostrils daily  as needed for allergies or rhinitis. [provider] Past Week Active Self  ?latanoprost (XALATAN) 0.005 % ophthalmic solution [id number] Yes Place 1 drop into both eyes 2 (two) times daily. [provider] 05/25/2021 Active Self  ?Multiple Vitamin (MULTIVITAMIN WITH MINERALS) TABS tablet [id number] Yes Take 1 tablet by mouth daily. [provider] 05/25/2021 Active Self  ?         ?Med Note Leanord Asal T   Tue Oct 06, 2016 10:16 AM)    ?pantoprazole (PROTONIX) 40 MG tablet [id number] Yes Take 40 mg by mouth 2 (two) times daily. [provider] 05/25/2021 Active Self  ?polyethylene glycol (MIRALAX / GLYCOLAX) 17 g packet [id number] Yes Take 17 g by mouth daily.  ?Patient taking differently: Take 17 g by mouth See admin instructions. Every other day  ? Lonnie Poisson, MD 05/23/2021 Active Self  ?psyllium (HYDROCIL/METAMUCIL) 95 % PACK [id number] No Take 1 packet by mouth daily.  ?Patient not taking: Reported on 05/10/2021  ? Lonnie Poisson, MD Not Taking Active Self  ?rosuvastatin (CRESTOR) 10 MG tablet [id number] Yes Take 1 tablet by mouth once daily [place], [name], MD unk Active Self, Spouse/Significant Other  ?         ?Med Note (Hansen, Lonnie   Mon May 26, 2021 10:32 AM) Patient stated he has both 10 mg and 20 mg tablets at home. Patient stated he sometimes takes two 10 mg tablets to meet his 20 mg dose.   ?rosuvastatin (CRESTOR) 20 MG  tablet [id number] Yes Take 20 mg by mouth daily. [provider] 05/24/2021 Active Self, Spouse/Significant Other  ?         ?Med Note (Hansen, Lonnie Hansen   Mon May 26, 2021 10:36 AM) Patient stated he has both 10 mg and 20 mg tablets at home. Patient stated he sometimes takes two 10 mg tablets to meet his 20 mg dose.   ?triamcinolone cream (KENALOG) 0.1 % [id number] Yes Apply 1 application. topically 3 (three) times daily as needed (facial itching/rash). [provider] 05/25/2021 Active Self  ? ?  ?  ? ?  ? ? ? ?SDOH:  (Social Determinants  of Health) assessments and interventions performed:  ? ? ? ?Care Plan ? ?Review of patient past medical history, allergies, medications, health status, including review of consultants reports, laboratory and other test data, was performed as part of comprehensive evaluation for care management services.  ? ?Care Plan : General Plan of Care (Adult)  ?Updates made by [name], RN since 06/02/2021 12:00 AM  ?  ? ?Problem: Knowledge deficit related to management of chronic medical conditions (ITP and HTN)   ?Priority: High  ?  ? ?Long-Range Goal: Patient will verbalize and display chronic health management evidenced by no hospital admissions in the next year (goal date extended, readmitted)   ?Start Date: 09/19/2020  ?Expected End Date: 09/19/2021  ?This Visit's Progress: On track  ?Recent Progress: Not on track  ?Priority: High  ?Note:   ?Current Barriers:  ?Knowledge Deficits related to plan of care for management of HTN, HLD, and ITP  ?Chronic Disease Management support and education needs related to HTN, HLD, and ITP ? ?RNCM Clinical Goal(s):  ?Patient will verbalize understanding of plan for management of HTN and ITP ?take all medications exactly as prescribed and will call provider for medication related questions ?attend all scheduled medical appointments: PCP, Cardiology, and Hematology ?continue to work with RN Care Manager to address care management and care coordination needs related to HTN and ITP  through collaboration with RN Care manager, provider, and care team.  ? ?Interventions: ?Inter-dis1:1 collaboration with primary care provider regarding development and update of comprehensive plan of care as evidenced by provider attestation and co-signatureciplinary care team collaboration (see longitudinal plan of care) ?Evaluation of current treatment plan related to  self management and patient's adherence to plan as established by provider ? ? ?Hypertension: (Status: Condition stable. Not addressed this  visit.) ?Last practice recorded BP readings:  ?BP Readings from Last 3 Encounters:  ?05/30/21 139/84  ?05/14/21 138/85  ?05/08/21 (!) 144/92  ?Most recent eGFR/CrCl: No results found for: EGFR  No components found for: CRCL ? ?Evaluation of current treatment plan related to hypertension self management and patient's adherence to plan as established by provider;   ?Reviewed prescribed diet low salt ?Advised patient, providing education and rationale, to monitor blood pressure daily and record, calling PCP for findings outside established parameters;  ?Discussed complications of poorly controlled blood pressure such as heart disease, stroke, circulatory complications, vision complications, kidney impairment, sexual dysfunction;  ?Screening for signs and symptoms of depression related to chronic disease state;  ? ?11/8 - Admits that he is not consistent with monitoring BP daily.  Strongly encouraged to do so and record readings, state he will try.   ? ? ?Hyperlipidemia Interventions:  (Status:  Goal on track:  Yes.) Long Term Goal ?Medication review performed; medication list updated in electronic medical record.  ?Provider established cholesterol goals reviewed ?Counseled on importance  of regular laboratory monitoring as prescribed ?Provided HLD educational materials ? ?ITPGoal on track:  Yes. ?Evaluation of current treatment plan related to  ITP , self-management and patient's adherence to plan as established by provider. ?Discussed plans with patient for ongoing care management follow up and provided patient with direct contact information for care management team ?Advised patient to call provider with any signs of bleeding.  Platetlets in August were 99,000, on 11/3 decreased to 78,000; ?Provided education to patient re: management of ITP and chronic anemia; ?Reviewed medications with patient and discussed schedule of follow up, including next set of labs and pending injection; ? ?11/8 - Member state he has several  follow up appointments in the next few weeks.  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

## 2021-06-03 ENCOUNTER — Other Ambulatory Visit: Payer: Self-pay

## 2021-06-03 ENCOUNTER — Inpatient Hospital Stay: Payer: Medicare Other | Attending: Hematology & Oncology

## 2021-06-03 ENCOUNTER — Inpatient Hospital Stay (HOSPITAL_BASED_OUTPATIENT_CLINIC_OR_DEPARTMENT_OTHER): Payer: Medicare Other | Admitting: Family

## 2021-06-03 ENCOUNTER — Inpatient Hospital Stay: Payer: Medicare Other

## 2021-06-03 ENCOUNTER — Encounter: Payer: Self-pay | Admitting: Family

## 2021-06-03 VITALS — BP 130/71 | HR 71 | Temp 97.8°F | Resp 19 | Ht 68.0 in | Wt 201.0 lb

## 2021-06-03 DIAGNOSIS — D693 Immune thrombocytopenic purpura: Secondary | ICD-10-CM | POA: Insufficient documentation

## 2021-06-03 DIAGNOSIS — Z79899 Other long term (current) drug therapy: Secondary | ICD-10-CM | POA: Diagnosis not present

## 2021-06-03 DIAGNOSIS — D509 Iron deficiency anemia, unspecified: Secondary | ICD-10-CM

## 2021-06-03 DIAGNOSIS — R5383 Other fatigue: Secondary | ICD-10-CM | POA: Insufficient documentation

## 2021-06-03 LAB — CBC WITH DIFFERENTIAL (CANCER CENTER ONLY)
Abs Immature Granulocytes: 0.06 10*3/uL (ref 0.00–0.07)
Basophils Absolute: 0 10*3/uL (ref 0.0–0.1)
Basophils Relative: 0 %
Eosinophils Absolute: 0.2 10*3/uL (ref 0.0–0.5)
Eosinophils Relative: 3 %
HCT: 25.9 % — ABNORMAL LOW (ref 39.0–52.0)
Hemoglobin: 8.1 g/dL — ABNORMAL LOW (ref 13.0–17.0)
Immature Granulocytes: 1 %
Lymphocytes Relative: 15 %
Lymphs Abs: 0.9 10*3/uL (ref 0.7–4.0)
MCH: 28.2 pg (ref 26.0–34.0)
MCHC: 31.3 g/dL (ref 30.0–36.0)
MCV: 90.2 fL (ref 80.0–100.0)
Monocytes Absolute: 0.7 10*3/uL (ref 0.1–1.0)
Monocytes Relative: 12 %
Neutro Abs: 4 10*3/uL (ref 1.7–7.7)
Neutrophils Relative %: 69 %
Platelet Count: 79 10*3/uL — ABNORMAL LOW (ref 150–400)
RBC: 2.87 MIL/uL — ABNORMAL LOW (ref 4.22–5.81)
RDW: 15.8 % — ABNORMAL HIGH (ref 11.5–15.5)
WBC Count: 5.7 10*3/uL (ref 4.0–10.5)
nRBC: 0 % (ref 0.0–0.2)

## 2021-06-03 LAB — IRON AND IRON BINDING CAPACITY (CC-WL,HP ONLY)
Iron: 61 ug/dL (ref 45–182)
Saturation Ratios: 15 % — ABNORMAL LOW (ref 17.9–39.5)
TIBC: 405 ug/dL (ref 250–450)
UIBC: 344 ug/dL (ref 117–376)

## 2021-06-03 LAB — CMP (CANCER CENTER ONLY)
ALT: 9 U/L (ref 0–44)
AST: 17 U/L (ref 15–41)
Albumin: 4 g/dL (ref 3.5–5.0)
Alkaline Phosphatase: 33 U/L — ABNORMAL LOW (ref 38–126)
Anion gap: 8 (ref 5–15)
BUN: 15 mg/dL (ref 8–23)
CO2: 27 mmol/L (ref 22–32)
Calcium: 9.4 mg/dL (ref 8.9–10.3)
Chloride: 106 mmol/L (ref 98–111)
Creatinine: 1.08 mg/dL (ref 0.61–1.24)
GFR, Estimated: >60 mL/min (ref >60)
Glucose, Bld: 91 mg/dL (ref 70–99)
Potassium: 3.6 mmol/L (ref 3.5–5.1)
Sodium: 141 mmol/L (ref 135–145)
Total Bilirubin: 0.7 mg/dL (ref 0.3–1.2)
Total Protein: 6.3 g/dL — ABNORMAL LOW (ref 6.5–8.1)

## 2021-06-03 LAB — FERRITIN: Ferritin: 26 ng/mL (ref 24–336)

## 2021-06-03 MED ORDER — ROMIPLOSTIM 125 MCG ~~LOC~~ SOLR
95.0000 ug | Freq: Once | SUBCUTANEOUS | Status: AC
  Administered 2021-06-03: 95 ug via SUBCUTANEOUS
  Filled 2021-06-03: qty 0.19

## 2021-06-03 NOTE — Patient Instructions (Signed)
Romiplostim injection ?What is this medication? ?ROMIPLOSTIM (roe mi PLOE stim) helps your body make more platelets. This medicine is used to treat low platelets caused by chronic idiopathic thrombocytopenic purpura (ITP) or a bone marrow syndrome caused by radiation sickness. ?This medicine may be used for other purposes; ask your health care provider or pharmacist if you have questions. ?COMMON BRAND NAME(S): Nplate ?What should I tell my care team before I take this medication? ?They need to know if you have any of these conditions: ?blood clots ?myelodysplastic syndrome ?an unusual or allergic reaction to romiplostim, mannitol, other medicines, foods, dyes, or preservatives ?pregnant or trying to get pregnant ?breast-feeding ?How should I use this medication? ?This medicine is injected under the skin. It is given by a health care provider in a hospital or clinic setting. ?A special MedGuide will be given to you before each treatment. Be sure to read this information carefully each time. ?Talk to your health care provider about the use of this medicine in children. While it may be prescribed for children as young as newborns for selected conditions, precautions do apply. ?Overdosage: If you think you have taken too much of this medicine contact a poison control center or emergency room at once. ?NOTE: This medicine is only for you. Do not share this medicine with others. ?What if I miss a dose? ?Keep appointments for follow-up doses. It is important not to miss your dose. Call your health care provider if you are unable to keep an appointment. ?What may interact with this medication? ?Interactions are not expected. ?This list may not describe all possible interactions. Give your health care provider a list of all the medicines, herbs, non-prescription drugs, or dietary supplements you use. Also tell them if you smoke, drink alcohol, or use illegal drugs. Some items may interact with your medicine. ?What should I  watch for while using this medication? ?Visit your health care provider for regular checks on your progress. You may need blood work done while you are taking this medicine. Your condition will be monitored carefully while you are receiving this medicine. It is important not to miss any appointments. ?What side effects may I notice from receiving this medication? ?Side effects that you should report to your doctor or health care professional as soon as possible: ?allergic reactions (skin rash, itching or hives; swelling of the face, lips, or tongue) ?bleeding (bloody or black, tarry stools; red or dark brown urine; spitting up blood or brown material that looks like coffee grounds; red spots on the skin; unusual bruising or bleeding from the eyes, gums, or nose) ?blood clot (chest pain; shortness of breath; pain, swelling, or warmth in the leg) ?stroke (changes in vision; confusion; trouble speaking or understanding; severe headaches; sudden numbness or weakness of the face, arm or leg; trouble walking; dizziness; loss of balance or coordination) ?Side effects that usually do not require medical attention (report to your doctor or health care professional if they continue or are bothersome): ?diarrhea ?dizziness ?headache ?joint pain ?muscle pain ?stomach pain ?trouble sleeping ?This list may not describe all possible side effects. Call your doctor for medical advice about side effects. You may report side effects to FDA at 1-800-FDA-1088. ?Where should I keep my medication? ?This medicine is given in a hospital or clinic. It will not be stored at home. ?NOTE: This sheet is a summary. It may not cover all possible information. If you have questions about this medicine, talk to your doctor, pharmacist, or health care provider. ??   2023 Elsevier/Gold Standard (2020-12-13 00:00:00) ? ?

## 2021-06-03 NOTE — Progress Notes (Signed)
?Hematology and Oncology Follow Up Visit ? ?Lonnie Hansen ?[id number] ?December 25, 1950 71 y.o. ?06/03/2021 ? ? 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  ?Current Therapy:        ?Nplate to keep platelet count over 100,000 ?  ?Interim History:  Lonnie Hansen is here today for follow-up and injection. He was in the hospital last week with diverticulosis that developed GI bleed in the sigmoid colon requiring coil embolization of marginal arteries.  ?He received a total of 3 units of blood during admission.  ?He is starting to feel better but still notes some fatigue.   ?Hgb is stable at 8.1, MCV 90, platelets 79 and WBC count 5.7.  ?He has not noted any new blood loss. No bruising or petechiae.  ?No fever, chills, n/v, cough, rash, dizziness, chest pain, palpitations, abdominal pain or changes in bowel or bladder habits.  ?No swelling, tenderness, numbness or tingling in his extremities.  ?No falls or syncope to report. He ambulates with a cane for added support.  ?Appetite good, he is doing his best to hydrate well. Weight is 201 lbs.  ? ?ECOG Performance Status: 1 - Symptomatic but completely ambulatory ? ?Medications:  ?Allergies as of 06/03/2021   ?No Known Allergies ?  ? ?  ?Medication List  ?  ? ?  ? Accurate as of Jun 03, 2021 12:21 PM. If you have any questions, ask your nurse or doctor.  ?  ?  ? ?  ? ?acetaminophen 500 MG tablet ?Commonly known as: TYLENOL ?Take 1,000 mg by mouth daily as needed (pain). ?  ?Anucort-HC 25 MG suppository ?Generic drug: hydrocortisone ?Place 1 suppository rectally daily as needed for pain. ?  ?atenolol 25 MG tablet ?Commonly known as: TENORMIN ?Take 12.5 mg by mouth 2 (two) times daily. ?  ?ferrous sulfate 325 (65 FE) MG tablet ?Take 325 mg by mouth daily. ?  ?fluticasone 50 MCG/ACT nasal spray ?Commonly known as: FLONASE ?Place 2 sprays into both nostrils daily as needed for allergies or rhinitis. ?  ?latanoprost 0.005 % ophthalmic solution ?Commonly known as: XALATAN ?Place 1 drop into both  eyes 2 (two) times daily. ?  ?multivitamin with minerals Tabs tablet ?Take 1 tablet by mouth daily. ?  ?pantoprazole 40 MG tablet ?Commonly known as: PROTONIX ?Take 40 mg by mouth 2 (two) times daily. ?  ?polyethylene glycol 17 g packet ?Commonly known as: MIRALAX / GLYCOLAX ?Take 17 g by mouth daily. ?What changed:  ?when to take this ?additional instructions ?  ?psyllium 95 % Pack ?Commonly known as: HYDROCIL/METAMUCIL ?Take 1 packet by mouth daily. ?  ?rosuvastatin 20 MG tablet ?Commonly known as: CRESTOR ?Take 20 mg by mouth daily. ?  ?triamcinolone cream 0.1 % ?Commonly known as: KENALOG ?Apply 1 application. topically 3 (three) times daily as needed (facial itching/rash). ?  ? ?  ? ? ?Allergies: No Known Allergies ? ?Past Medical History, Surgical history, Social history, and Family History were reviewed and updated. ? ?Review of Systems: ?All other 10 point review of systems is negative.  ? ?Physical Exam: ? vitals were not taken for this visit.  ? ?Wt Readings from Last 3 Encounters:  ?05/28/21 195 lb 5.2 oz (88.6 kg)  ?05/14/21 190 lb (86.2 kg)  ?04/16/21 205 lb (93 kg)  ? ? ?Ocular: Sclerae unicteric, pupils equal, round and reactive to light ?Ear-nose-throat: Oropharynx clear, dentition fair ?Lymphatic: No cervical or supraclavicular adenopathy ?Lungs no rales or rhonchi, good excursion bilaterally ?Heart regular rate and rhythm, no murmur  appreciated ?Abd soft, nontender, positive bowel sounds ?MSK no focal spinal tenderness, no joint edema ?Neuro: non-focal, well-oriented, appropriate affect ?Breasts: Deferred  ? ?Lab Results  ?Component Value Date  ? WBC 5.7 06/03/2021  ? HGB 8.1 (L) 06/03/2021  ? HCT 25.9 (L) 06/03/2021  ? MCV 90.2 06/03/2021  ? PLT 79 (L) 06/03/2021  ? ?No results found for: FERRITIN, IRON, TIBC, UIBC, IRONPCTSAT ?Lab Results  ?Component Value Date  ? RBC 2.87 (L) 06/03/2021  ? ?No results found for: KPAFRELGTCHN, LAMBDASER, KAPLAMBRATIO ?No results found for: IGGSERUM, IGA,  IGMSERUM ?No results found for: TOTALPROTELP, ALBUMINELP, A1GS, A2GS, BETS, BETA2SER, GAMS, MSPIKE, SPEI ?  Chemistry   ?   ?Component Value Date/Time  ? NA 138 05/29/2021 0218  ? NA 140 05/11/2019 0946  ? K 3.5 05/29/2021 0218  ? CL 106 05/29/2021 0218  ? CO2 27 05/29/2021 0218  ? BUN 10 05/29/2021 0218  ? BUN 17 05/11/2019 0946  ? CREATININE 1.10 05/29/2021 0218  ? CREATININE 1.15 05/09/2021 1309  ? CREATININE 0.75 04/10/2016 1132  ?    ?Component Value Date/Time  ? CALCIUM 8.2 (L) 05/29/2021 0218  ? ALKPHOS 29 (L) 05/26/2021 0650  ? AST 19 05/26/2021 0650  ? AST 19 05/09/2021 1309  ? ALT 10 05/26/2021 0650  ? ALT 12 05/09/2021 1309  ? BILITOT 0.6 05/26/2021 0650  ? BILITOT 0.7 05/09/2021 1309  ?  ? ? ? ?Impression and Plan: Lonnie Hansen is a very pleasant 71 yo African American gentleman with chronic ITP.  ?Nplate given today.  ?Iron studies are pending.  ?Lab check and injection every 2 weeks, follow-up in 8 weeks.  ? ?Lottie Dawson, NP ?5/9/202312:21 PM ? ?

## 2021-06-04 ENCOUNTER — Other Ambulatory Visit: Payer: Self-pay | Admitting: Family

## 2021-06-05 ENCOUNTER — Ambulatory Visit: Payer: Self-pay | Admitting: *Deleted

## 2021-06-06 ENCOUNTER — Other Ambulatory Visit: Payer: Self-pay | Admitting: *Deleted

## 2021-06-06 NOTE — Patient Outreach (Signed)
[facility]) Care Management ? ?06/06/2021 ? ?[name] ?11-11-1950 ?[id number] ? ? ?Member's case discussed with multidisciplinary team due to 30 day readmission.  Update provided, member has had no further bleeding since embolization and discharge.  Has follow up appointments with PCP, GI, Hematology, and CTS.  Will follow up with member within the next month as  planned. ? ?[name], RN, MSN, CCM ?[facility] Care Management  ?Community Care Manager ?[phone] ? ?

## 2021-06-12 ENCOUNTER — Inpatient Hospital Stay: Payer: Medicare Other

## 2021-06-12 VITALS — BP 145/75 | HR 56 | Temp 98.8°F | Resp 17

## 2021-06-12 DIAGNOSIS — D693 Immune thrombocytopenic purpura: Secondary | ICD-10-CM

## 2021-06-12 DIAGNOSIS — R5383 Other fatigue: Secondary | ICD-10-CM | POA: Diagnosis not present

## 2021-06-12 DIAGNOSIS — Z79899 Other long term (current) drug therapy: Secondary | ICD-10-CM | POA: Diagnosis not present

## 2021-06-12 LAB — CBC WITH DIFFERENTIAL (CANCER CENTER ONLY)
Abs Immature Granulocytes: 0.03 10*3/uL (ref 0.00–0.07)
Basophils Absolute: 0 10*3/uL (ref 0.0–0.1)
Basophils Relative: 0 %
Eosinophils Absolute: 0.2 10*3/uL (ref 0.0–0.5)
Eosinophils Relative: 4 %
HCT: 26.2 % — ABNORMAL LOW (ref 39.0–52.0)
Hemoglobin: 8.2 g/dL — ABNORMAL LOW (ref 13.0–17.0)
Immature Granulocytes: 1 %
Lymphocytes Relative: 18 %
Lymphs Abs: 0.9 10*3/uL (ref 0.7–4.0)
MCH: 28.4 pg (ref 26.0–34.0)
MCHC: 31.3 g/dL (ref 30.0–36.0)
MCV: 90.7 fL (ref 80.0–100.0)
Monocytes Absolute: 0.6 10*3/uL (ref 0.1–1.0)
Monocytes Relative: 12 %
Neutro Abs: 3 10*3/uL (ref 1.7–7.7)
Neutrophils Relative %: 65 %
Platelet Count: 110 10*3/uL — ABNORMAL LOW (ref 150–400)
RBC: 2.89 MIL/uL — ABNORMAL LOW (ref 4.22–5.81)
RDW: 16.1 % — ABNORMAL HIGH (ref 11.5–15.5)
WBC Count: 4.7 10*3/uL (ref 4.0–10.5)
nRBC: 0 % (ref 0.0–0.2)

## 2021-06-12 MED ORDER — ROMIPLOSTIM 125 MCG ~~LOC~~ SOLR
1.0000 ug/kg | Freq: Once | SUBCUTANEOUS | Status: AC
  Administered 2021-06-12: 90 ug via SUBCUTANEOUS
  Filled 2021-06-12: qty 0.18

## 2021-06-12 MED ORDER — SODIUM CHLORIDE 0.9 % IV SOLN: Freq: Once | INTRAVENOUS | Status: AC

## 2021-06-12 MED ORDER — SODIUM CHLORIDE 0.9 % IV SOLN
300.0000 mg | Freq: Once | INTRAVENOUS | Status: AC
  Administered 2021-06-12: 300 mg via INTRAVENOUS
  Filled 2021-06-12: qty 300

## 2021-06-12 NOTE — Patient Instructions (Signed)
Romiplostim injection What is this medication? ROMIPLOSTIM (roe mi PLOE stim) helps your body make more platelets. This medicine is used to treat low platelets caused by chronic idiopathic thrombocytopenic purpura (ITP) or a bone marrow syndrome caused by radiation sickness. This medicine may be used for other purposes; ask your health care provider or pharmacist if you have questions. COMMON BRAND NAME(S): Nplate What should I tell my care team before I take this medication? They need to know if you have any of these conditions: blood clots myelodysplastic syndrome an unusual or allergic reaction to romiplostim, mannitol, other medicines, foods, dyes, or preservatives pregnant or trying to get pregnant breast-feeding How should I use this medication? This medicine is injected under the skin. It is given by a health care provider in a hospital or clinic setting. A special MedGuide will be given to you before each treatment. Be sure to read this information carefully each time. Talk to your health care provider about the use of this medicine in children. While it may be prescribed for children as young as newborns for selected conditions, precautions do apply. Overdosage: If you think you have taken too much of this medicine contact a poison control center or emergency room at once. NOTE: This medicine is only for you. Do not share this medicine with others. What if I miss a dose? Keep appointments for follow-up doses. It is important not to miss your dose. Call your health care provider if you are unable to keep an appointment. What may interact with this medication? Interactions are not expected. This list may not describe all possible interactions. Give your health care provider a list of all the medicines, herbs, non-prescription drugs, or dietary supplements you use. Also tell them if you smoke, drink alcohol, or use illegal drugs. Some items may interact with your medicine. What should I  watch for while using this medication? Visit your health care provider for regular checks on your progress. You may need blood work done while you are taking this medicine. Your condition will be monitored carefully while you are receiving this medicine. It is important not to miss any appointments. What side effects may I notice from receiving this medication? Side effects that you should report to your doctor or health care professional as soon as possible: allergic reactions (skin rash, itching or hives; swelling of the face, lips, or tongue) bleeding (bloody or black, tarry stools; red or dark brown urine; spitting up blood or brown material that looks like coffee grounds; red spots on the skin; unusual bruising or bleeding from the eyes, gums, or nose) blood clot (chest pain; shortness of breath; pain, swelling, or warmth in the leg) stroke (changes in vision; confusion; trouble speaking or understanding; severe headaches; sudden numbness or weakness of the face, arm or leg; trouble walking; dizziness; loss of balance or coordination) Side effects that usually do not require medical attention (report to your doctor or health care professional if they continue or are bothersome): diarrhea dizziness headache joint pain muscle pain stomach pain trouble sleeping This list may not describe all possible side effects. Call your doctor for medical advice about side effects. You may report side effects to FDA at 1-800-FDA-1088. Where should I keep my medication? This medicine is given in a hospital or clinic. It will not be stored at home. NOTE: This sheet is a summary. It may not cover all possible information. If you have questions about this medicine, talk to your doctor, pharmacist, or health care provider.    2023 Elsevier/Gold Standard (2020-12-13 00:00:00) Iron Sucrose Injection What is this medication? IRON SUCROSE (EYE ern SOO krose) treats low levels of iron (iron deficiency anemia) in  people with kidney disease. Iron is a mineral that plays an important role in making red blood cells, which carry oxygen from your lungs to the rest of your body. This medicine may be used for other purposes; ask your health care provider or pharmacist if you have questions. COMMON BRAND NAME(S): Venofer What should I tell my care team before I take this medication? They need to know if you have any of these conditions: Anemia not caused by low iron levels Heart disease High levels of iron in the blood Kidney disease Liver disease An unusual or allergic reaction to iron, other medications, foods, dyes, or preservatives Pregnant or trying to get pregnant Breast-feeding How should I use this medication? This medication is for infusion into a vein. It is given in a hospital or clinic setting. Talk to your care team about the use of this medication in children. While this medication may be prescribed for children as young as 2 years for selected conditions, precautions do apply. Overdosage: If you think you have taken too much of this medicine contact a poison control center or emergency room at once. NOTE: This medicine is only for you. Do not share this medicine with others. What if I miss a dose? It is important not to miss your dose. Call your care team if you are unable to keep an appointment. What may interact with this medication? Do not take this medication with any of the following: Deferoxamine Dimercaprol Other iron products This medication may also interact with the following: Chloramphenicol Deferasirox This list may not describe all possible interactions. Give your health care provider a list of all the medicines, herbs, non-prescription drugs, or dietary supplements you use. Also tell them if you smoke, drink alcohol, or use illegal drugs. Some items may interact with your medicine. What should I watch for while using this medication? Visit your care team regularly. Tell your  care team if your symptoms do not start to get better or if they get worse. You may need blood work done while you are taking this medication. You may need to follow a special diet. Talk to your care team. Foods that contain iron include: whole grains/cereals, dried fruits, beans, or peas, leafy green vegetables, and organ meats (liver, kidney). What side effects may I notice from receiving this medication? Side effects that you should report to your care team as soon as possible: Allergic reactions--skin rash, itching, hives, swelling of the face, lips, tongue, or throat Low blood pressure--dizziness, feeling faint or lightheaded, blurry vision Shortness of breath Side effects that usually do not require medical attention (report to your care team if they continue or are bothersome): Flushing Headache Joint pain Muscle pain Nausea Pain, redness, or irritation at injection site This list may not describe all possible side effects. Call your doctor for medical advice about side effects. You may report side effects to FDA at 1-800-FDA-1088. Where should I keep my medication? This medication is given in a hospital or clinic and will not be stored at home. NOTE: This sheet is a summary. It may not cover all possible information. If you have questions about this medicine, talk to your doctor, pharmacist, or health care provider.  2023 Elsevier/Gold Standard (2020-06-07 00:00:00)

## 2021-06-18 ENCOUNTER — Ambulatory Visit
Admission: RE | Admit: 2021-06-18 | Discharge: 2021-06-18 | Disposition: A | Discharge status: Home / Self Care | Payer: Medicare Other | Source: Ambulatory Visit | Attending: Surgery | Admitting: Surgery

## 2021-06-18 ENCOUNTER — Encounter: Payer: Self-pay | Admitting: Surgery

## 2021-06-18 ENCOUNTER — Ambulatory Visit (INDEPENDENT_AMBULATORY_CARE_PROVIDER_SITE_OTHER): Payer: Medicare Other | Admitting: Surgery

## 2021-06-18 VITALS — BP 127/75 | HR 65 | Resp 20 | Ht 68.0 in | Wt 200.0 lb

## 2021-06-18 DIAGNOSIS — I2510 Atherosclerotic heart disease of native coronary artery without angina pectoris: Secondary | ICD-10-CM | POA: Diagnosis not present

## 2021-06-18 DIAGNOSIS — I7121 Aneurysm of the ascending aorta, without rupture: Secondary | ICD-10-CM

## 2021-06-18 DIAGNOSIS — J9811 Atelectasis: Secondary | ICD-10-CM | POA: Diagnosis not present

## 2021-06-18 DIAGNOSIS — J929 Pleural plaque without asbestos: Secondary | ICD-10-CM | POA: Diagnosis not present

## 2021-06-18 DIAGNOSIS — I712 Thoracic aortic aneurysm, without rupture, unspecified: Secondary | ICD-10-CM | POA: Diagnosis not present

## 2021-06-18 MED ORDER — IOPAMIDOL (ISOVUE-300) INJECTION 61%
100.0000 mL | Freq: Once | INTRAVENOUS | Status: AC | PRN
  Administered 2021-06-18: 100 mL via INTRAVENOUS

## 2021-06-19 ENCOUNTER — Inpatient Hospital Stay: Payer: Medicare Other

## 2021-06-19 VITALS — BP 135/76 | HR 72 | Temp 97.6°F | Resp 18

## 2021-06-19 DIAGNOSIS — D693 Immune thrombocytopenic purpura: Secondary | ICD-10-CM

## 2021-06-19 DIAGNOSIS — Z79899 Other long term (current) drug therapy: Secondary | ICD-10-CM | POA: Diagnosis not present

## 2021-06-19 DIAGNOSIS — R5383 Other fatigue: Secondary | ICD-10-CM | POA: Diagnosis not present

## 2021-06-19 LAB — CMP (CANCER CENTER ONLY)
ALT: 8 U/L (ref 0–44)
AST: 17 U/L (ref 15–41)
Albumin: 4.5 g/dL (ref 3.5–5.0)
Alkaline Phosphatase: 43 U/L (ref 38–126)
Anion gap: 7 (ref 5–15)
BUN: 17 mg/dL (ref 8–23)
CO2: 27 mmol/L (ref 22–32)
Calcium: 9.6 mg/dL (ref 8.9–10.3)
Chloride: 104 mmol/L (ref 98–111)
Creatinine: 1.12 mg/dL (ref 0.61–1.24)
GFR, Estimated: >60 mL/min (ref >60)
Glucose, Bld: 99 mg/dL (ref 70–99)
Potassium: 4.1 mmol/L (ref 3.5–5.1)
Sodium: 138 mmol/L (ref 135–145)
Total Bilirubin: 0.6 mg/dL (ref 0.3–1.2)
Total Protein: 7.3 g/dL (ref 6.5–8.1)

## 2021-06-19 LAB — CBC WITH DIFFERENTIAL (CANCER CENTER ONLY)
Abs Immature Granulocytes: 0.04 10*3/uL (ref 0.00–0.07)
Basophils Absolute: 0 10*3/uL (ref 0.0–0.1)
Basophils Relative: 0 %
Eosinophils Absolute: 0.2 10*3/uL (ref 0.0–0.5)
Eosinophils Relative: 2 %
HCT: 31.5 % — ABNORMAL LOW (ref 39.0–52.0)
Hemoglobin: 9.7 g/dL — ABNORMAL LOW (ref 13.0–17.0)
Immature Granulocytes: 1 %
Lymphocytes Relative: 15 %
Lymphs Abs: 1.1 10*3/uL (ref 0.7–4.0)
MCH: 27.9 pg (ref 26.0–34.0)
MCHC: 30.8 g/dL (ref 30.0–36.0)
MCV: 90.5 fL (ref 80.0–100.0)
Monocytes Absolute: 0.8 10*3/uL (ref 0.1–1.0)
Monocytes Relative: 11 %
Neutro Abs: 5.4 10*3/uL (ref 1.7–7.7)
Neutrophils Relative %: 71 %
Platelet Count: 143 10*3/uL — ABNORMAL LOW (ref 150–400)
RBC: 3.48 MIL/uL — ABNORMAL LOW (ref 4.22–5.81)
RDW: 17.1 % — ABNORMAL HIGH (ref 11.5–15.5)
WBC Count: 7.6 10*3/uL (ref 4.0–10.5)
nRBC: 0 % (ref 0.0–0.2)

## 2021-06-19 MED ORDER — SODIUM CHLORIDE 0.9 % IV SOLN
300.0000 mg | Freq: Once | INTRAVENOUS | Status: AC
  Administered 2021-06-19: 300 mg via INTRAVENOUS
  Filled 2021-06-19: qty 300

## 2021-06-19 MED ORDER — ROMIPLOSTIM 125 MCG ~~LOC~~ SOLR: 1.0000 ug/kg | Freq: Once | SUBCUTANEOUS | Status: DC

## 2021-06-19 MED ORDER — SODIUM CHLORIDE 0.9 % IV SOLN: Freq: Once | INTRAVENOUS | Status: AC

## 2021-06-19 NOTE — Patient Instructions (Signed)

## 2021-06-19 NOTE — Progress Notes (Signed)
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 Servando Snare on 09/15/2016 using an Edwards model 3300 TFX 29 mm pericardial valve and a 32 mm Gelweave Valsalva graft.  He has been followed since for aortic surveillance.  He has a history of ITP with anemia and has been undergoing iron infusions.  He was recently admitted with a lower GI bleed and underwent coil embolization of the source of the acute sigmoid colon hemorrhage on 05/26/2021.  He has made a good recovery from that.  He was last seen in our office by one of our PAs on 12/11/2020 and CTA of the chest at that time showed the residual aorta beyond the repair to measure 4.6 cm in diameter.  Current Outpatient Medications  Medication Sig Dispense Refill   acetaminophen (TYLENOL) 500 MG tablet Take 1,000 mg by mouth daily as needed (pain).     ANUCORT-HC 25 MG suppository Place 1 suppository rectally daily as needed for pain.     atenolol (TENORMIN) 25 MG tablet Take 12.5 mg by mouth 2 (two) times daily. 180 tablet 3   ferrous sulfate 325 (65 FE) MG tablet Take 325 mg by mouth daily.     fluticasone (FLONASE) 50 MCG/ACT nasal spray Place 2 sprays into both nostrils daily as needed for allergies or rhinitis.     latanoprost (XALATAN) 0.005 % ophthalmic solution Place 1 drop into both eyes 2 (two) times daily.     Multiple Vitamin (MULTIVITAMIN WITH MINERALS) TABS tablet Take 1 tablet by mouth daily.     pantoprazole (PROTONIX) 40 MG tablet Take 40 mg by mouth 2 (two) times daily.     polyethylene glycol (MIRALAX / GLYCOLAX) 17 g packet Take 17 g by mouth daily. (Patient taking differently: Take 17 g by mouth See admin instructions. Every other day) 14 each 0   psyllium (HYDROCIL/METAMUCIL) 95 % PACK Take 1 packet by mouth daily. 240 each 3   rosuvastatin (CRESTOR) 20 MG tablet Take 20 mg by mouth daily.     triamcinolone cream  (KENALOG) 0.1 % Apply 1 application. topically 3 (three) times daily as needed (facial itching/rash).     No current facility-administered medications for this visit.     Physical Exam: BP 127/75   Pulse 65   Resp 20   Ht '5\' 8"'$  (1.727 m)   Wt 200 lb (90.7 kg)   SpO2 99% Comment: RA  BMI 30.41 kg/m  He looks well. Cardiac exam shows a regular rate and rhythm with normal heart sounds.  There is no murmur. Lungs are clear.  Diagnostic Tests:  Narrative & Impression  CLINICAL DATA:  Follow-up aortic aneurysm. History of ascending aortic repair.   EXAM: CT ANGIOGRAPHY CHEST WITH CONTRAST   TECHNIQUE: Multidetector CT imaging of the chest was performed using the standard protocol during bolus administration of intravenous contrast. Multiplanar CT image reconstructions and MIPs were obtained to evaluate the vascular anatomy.   RADIATION DOSE REDUCTION: This exam was performed according to the departmental dose-optimization program which includes automated exposure control, adjustment of the mA and/or kV according to patient size and/or use of iterative reconstruction technique.   CONTRAST:  162m ISOVUE-300 IOPAMIDOL (ISOVUE-300) INJECTION 61%   COMPARISON:  Chest CTA 12/11/2020   FINDINGS: Cardiovascular: Prior aortic valve replacement and ascending thoracic aorta repair. 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 No  aortic dissection. Coronary atherosclerosis. Normal heart size. No pericardial effusion. No central pulmonary embolus.   Mediastinum/Nodes: No enlarged axillary, mediastinal, or hilar lymph nodes. Unremarkable thyroid and esophagus.   Lungs/Pleura: No pleural effusion or pneumothorax. Minimal dependent atelectasis in the left lower lobe. Unchanged minimal pleural thickening along the right minor fissure. No suspicious nodule.   Upper Abdomen: Unchanged subcentimeter hypodensity in the left hepatic lobe, too small  to fully characterize. Cholecystectomy. Asymmetric left renal atrophy with unchanged dystrophic calcification in the upper pole. Partially visualized bilateral renal cysts.   Musculoskeletal: No suspicious osseous lesion.   Review of the MIP images confirms the above findings.   IMPRESSION: 1. 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 Recommend semi-annual imaging followup by CTA or MRA. This recommendation follows 2010 ACCF/AHA/AATS/ACR/ASA/SCA/SCAI/SIR/STS/SVM Guidelines for the Diagnosis and Management of Patients With Thoracic Aortic Disease. Circulation. 2010; 121: J696-V89. Aortic aneurysm NOS (ICD10-I71.9) 2. Prior ascending aortic repair.     Electronically Signed   By: Logan Bores M.D.   On: 06/18/2021 16:17      Impression:  CTA of the chest shows aneurysmal dilatation of the aortic arch beyond his repair measuring up to 4.7 cm which is essentially unchanged from his previous scan in November 2022.  This is well below the surgical threshold of 5.5 cm and given his prior surgery should be followed with periodic CT scans.  I reviewed the CT images with him and answered his questions.  I stressed the importance of continued good blood pressure control in preventing further enlargement and acute aortic dissection.  I advised him against doing any heavy lifting that may require a Valsalva maneuver and could suddenly raise his blood pressure to high levels.  Plan:  He will return to see me in 1 year with a CTA of the chest.  I spent 20 minutes performing this established patient evaluation and > 50% of this time was spent face to face counseling and coordinating the care of this patient's aortic aneurysm.    Gaye Pollack, MD [facility] [phone]

## 2021-06-25 NOTE — Progress Notes (Signed)
Cardiology Office Note    Date:  06/30/2021   ID:  Lonnie Hansen, Lonnie Hansen 16-May-1950, MRN [id number]  PCP:  [name], MD  Cardiologist:  Dr. Martinique   Chief Complaint  Patient presents with   Coronary Artery Disease    History of Present Illness:  Lonnie Hansen is a 71 y.o. male is seen for follow up CAD.  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 In 2017 chest x-ray showed aortic root enlargement. He underwent CT that showed dilatation of proximal aorta. Descending aorta was normal. Echocardiogram obtained on 09/20/2015 showed EF 60-65%, moderate to severe AI, grade 1 DD. He was evaluated by Dr. Servando Snare. CT scan showed aortic root had enlarged to 5.2 cm. Given the size of aortic root and the degree of AI, it was recommended he undergo aortic root grafting and AVR. He underwent preoperative evaluation with cardiac catheterization on 04/14/2016 which showed moderate to severe AI, EF 45%, 80% stenosis in a nondominant RCA, 50% distal LAD. He eventually underwent the planned procedure with ascending aortic root replacement using 32 mm Gelweave Valsalva Graft and AVR using 29 mm Edwards Perimount Magna Ease aortic bioprosthesis valve on 09/15/2016 by Dr. Servando Snare.   When seen in follow up chest x-ray showed recurrent right pleural effusion.Echo on 10/09/2016 showed only a trivial amount of pericardial effusion, EF 60-65%. He underwent right thoracentesis again on 10/23/2016 with the removal 2 L of dark blood. Post procedure, he developed a right pneumothorax related to incomplete reexpansion of the right middle and lower lobe of the lung. He was admitted on 10/30/2016 for pigtail drain placement,  chest x-ray obtained on 11/04/2016 showed improvement of right pleural effusion and resolution of right pneumothorax. Followed by Dr. Servando Snare - Repeat CT chest in October 2020 looked good.   Patient was admitted in March 2021 with  lower GI bleed. Upper EGD normal. Colonoscopy showed extensive diverticulosis but no active bleeding.   He was seen in April 2021 with symptoms of lightheadedness. Echo was unremarkable. Normal EF and normal AV prosthesis function.   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   He was admitted again in April 2022 with recurrent GI bleed. EGD and sigmoidoscopy done. Apparently has cardiac arrest requiring CPR and Epi with ROSC.  Bleeding scan showed uptake in the rectum. He was transfused. He was readmitted with recurrent bleed in May. Again transfused. Colonoscopy showed blood throughout the colon. 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 He was seen by Dr Oneida Alar. Repair was deferred for now.   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 BP was high and pulse was low at 43. Atenolol dose was reduced and losartan increased.  A Zio patch monitor was placed. He was concerned that he had coronary blockage so a Myoview study was ordered. This demonstrated ischemia in the inferior and apical region with EF 42%. Most recent platelet count was 99K. He currently denied any chest pain or dyspnea. Notes some memory loss. No further bleeding. Since he was asymptomatic and at high bleeding risk further invasive evaluation was deferred. Echo done 09/16/20 with EF 65-70%, mild LVH, no RWMA, RV normal, mild MR, S/P AVR with 29 mm MagnaEase bioprosthesis (09/15/16) Peak and mean gradients throuugh the valve are 22 and 11 mm Hg respectively. No significant  change from report of April 2021. . The aortic valve has been repaired/replaced. Aortic valve regurgitation is not visualized.    He has been seen by [facility] 12/11/20 for CTA results with maximum diameter of the ascending aorta at 4.6 cm.  plan for repeat CTA in 6 months.  He was admitted 12/18/20 with acute cholecystitis. Underwent lap Choly and umbilical hernia repair without complications. He notes losartan  HCT was not resumed post op. I last saw him in Dec and he was recovering well. Seen by Dr Donzetta Matters in March for follow up of AAA.   He was admitted 4/15-4/19/23 with acute lower GI bleed due to bleeding diverticulum. Was transfused. Hgb down to 7.7. He was readmitted 5/1/-05/30/21 with recurrent bleed and was transfused 3 units PRBCs. Ended up having an arterial embolization procedure. Patient states he did not want colectomy. Now getting iron infusions.   He was seen by Dr Cyndia Bent on 06/17/21 for follow up of his thoracic aneurysm. CT was stable measuring 4.7 cm beyond the prior repair. Plan follow up in one year.   On follow up today he is doing well. Hgb up  to 10.9 with iron infusions. Denies any chest pain, palpitations, dizziness, SOB or recurrent bleeding. Off ASA now.     Past Medical History:  Diagnosis Date   AAA (abdominal aortic aneurysm) (HCC)    Anemia    Arthritis    Asymptomatic bilateral carotid artery stenosis 08/2015   1-39%    Cataracts, bilateral    Chronic ITP (idiopathic thrombocytopenia) ([place]) 03/31/2018   Coronary artery disease    Diverticulosis    Enlarged aorta (HCC)    Enlarged prostate    slightly   GERD (gastroesophageal reflux disease)    takes Pantoprazole daily as needed   Glaucoma    uses eye drops daily   Headache    History of colon polyps    benign   History of kidney stones    Hyperlipidemia    no on any meds   Hypertension    takes Amlodipine and Atenolol daily   OSA on CPAP    Vocal cord nodule    pt. states  it's a" growth on vocal cord"    Past Surgical History:  Procedure Laterality Date   AORTIC ARCH ANGIOGRAPHY N/A 04/16/2016   Procedure: Aortic Arch Angiography;  Surgeon: Rifky Lapre M Martinique, MD;  Location: [facility];  Service: Cardiovascular;  Laterality: N/A;   AORTIC VALVE REPLACEMENT N/A 09/15/2016   Procedure: AORTIC VALVE REPLACEMENT (AVR);  Surgeon: Grace Isaac, MD;  Location: [facility];  Service: Open Heart Surgery;   Laterality: N/A;  Using 75m Edwards Perimount Magna Ease Aortic Bioprosthesis Valve   ASCENDING AORTIC ROOT REPLACEMENT N/A 09/15/2016   Procedure: ASCENDING AORTIC ROOT REPLACEMENT;  Surgeon: GGrace Isaac MD;  [place] [facility]  Service: Open Heart Surgery;  Laterality: N/A;  Using 353mGelweave Valsalva Graft   CHOLECYSTECTOMY N/A 12/19/2020   Procedure: LAPAROSCOPIC CHOLECYSTECTOMY WITH POSSIBLE INTRAOPERATIVE CHOLANGIOGRAM;  Surgeon: WiGreer PickerelMD;  [facility] Service: General;  Laterality: N/A;   COLONOSCOPY     COLONOSCOPY Left 04/16/2019   Procedure: COLONOSCOPY;  Surgeon: OuArta SilenceMD;  [facility];  Service: Endoscopy;  Laterality: Left;   COLONOSCOPY N/A 06/01/2020   Procedure: COLONOSCOPY;  Surgeon: KaRonnette JuniperMD;  [facility] Service: Gastroenterology;  Laterality: N/A;   COLONOSCOPY WITH ESOPHAGOGASTRODUODENOSCOPY (EGD)     COLONOSCOPY WITH PROPOFOL N/A 05/14/2021  Procedure: COLONOSCOPY WITH PROPOFOL;  Surgeon: Ronnette Juniper, MD;  [facility];  Service: Gastroenterology;  Laterality: N/A;   ESOPHAGOGASTRODUODENOSCOPY N/A 05/25/2020   Procedure: ESOPHAGOGASTRODUODENOSCOPY (EGD);  Surgeon: Wilford Corner, MD;  Location: [facility] [place];  Service: Endoscopy;  Laterality: N/A;   ESOPHAGOGASTRODUODENOSCOPY (EGD) WITH PROPOFOL Left 04/16/2019   Procedure: ESOPHAGOGASTRODUODENOSCOPY (EGD) WITH PROPOFOL;  Surgeon: Arta Silence, MD;  Location: [facility];  Service: Endoscopy;  Laterality: Left;   FLEXIBLE SIGMOIDOSCOPY N/A 05/25/2020   Procedure: FLEXIBLE SIGMOIDOSCOPY;  Surgeon: Wilford Corner, MD;  Location: [place];  Service: Endoscopy;  Laterality: N/A;   IR ANGIOGRAM SELECTIVE EACH ADDITIONAL VESSEL  05/26/2021   IR ANGIOGRAM VISCERAL SELECTIVE  05/26/2021   IR EMBO ART  VEN HEMORR LYMPH EXTRAV  INC GUIDE ROADMAPPING  05/26/2021   IR THORACENTESIS ASP PLEURAL SPACE W/IMG GUIDE  10/23/2016   IR US GUIDE VASC ACCESS LEFT   05/26/2021   IR US GUIDE VASC ACCESS RIGHT  05/26/2021   MICROLARYNGOSCOPY Right 09/20/2017   Procedure: MICROLARYNGOSCOPY WITH  EXCISION OF VOCAL CORD LESION;  Surgeon: Leta Baptist, MD;  Location: [place];  Service: ENT;  Laterality: Right;   MULTIPLE EXTRACTIONS WITH ALVEOLOPLASTY N/A 06/10/2016   Procedure: Extraction of tooth #'s 2,8,13,15, and 29  with alveoloplasty, maxillary right and left buccal exostoses reductions, and gross debridement of remaining teeth.;  Surgeon: Lenn Cal, DDS;  Location: [place];  Service: Oral Surgery;  Laterality: N/A;   POLYPECTOMY  05/14/2021   Procedure: POLYPECTOMY;  Surgeon: Ronnette Juniper, MD;  Location[facility];  Service: Gastroenterology;;   RIGHT/LEFT HEART CATH AND CORONARY ANGIOGRAPHY N/A 04/16/2016   Procedure: Right/Left Heart Cath and Coronary Angiography;  Surgeon: Shunta Mclaurin M Martinique, MD;  Location: [facility];  Service: Cardiovascular;  Laterality: N/A;   TEE WITHOUT CARDIOVERSION N/A 09/15/2016   Procedure: TRANSESOPHAGEAL ECHOCARDIOGRAM (TEE);  Surgeon: Grace Isaac, MD;  Location: [place];  Service: Open Heart Surgery;  Laterality: N/A;    Current Medications: Outpatient Medications Prior to Visit  Medication Sig Dispense Refill   acetaminophen (TYLENOL) 500 MG tablet Take 1,000 mg by mouth daily as needed (pain).     amLODipine (NORVASC) 10 MG tablet Take 10 mg by mouth daily.     ANUCORT-HC 25 MG suppository Place 1 suppository rectally daily as needed for pain.     atenolol (TENORMIN) 25 MG tablet Take 12.5 mg by mouth 2 (two) times daily. 180 tablet 3   ferrous sulfate 325 (65 FE) MG tablet Take 325 mg by mouth daily.     fluticasone (FLONASE) 50 MCG/ACT nasal spray Place 2 sprays into both nostrils daily as needed for allergies or rhinitis.     latanoprost (XALATAN) 0.005 % ophthalmic solution Place 1 drop into both eyes 2 (two) times daily.     Multiple Vitamin (MULTIVITAMIN WITH MINERALS) TABS tablet Take 1  tablet by mouth daily.     pantoprazole (PROTONIX) 40 MG tablet Take 40 mg by mouth 2 (two) times daily.     polyethylene glycol (MIRALAX / GLYCOLAX) 17 g packet Take 17 g by mouth daily. (Patient taking differently: Take 17 g by mouth See admin instructions. Every other day) 14 each 0   psyllium (HYDROCIL/METAMUCIL) 95 % PACK Take 1 packet by mouth daily. 240 each 3   rosuvastatin (CRESTOR) 20 MG tablet Take 20 mg by mouth daily.     triamcinolone cream (KENALOG) 0.1 % Apply 1 application. topically 3 (three) times daily as needed (  facial itching/rash).     No facility-administered medications prior to visit.     Allergies:   Patient has no known allergies.   Social History   Socioeconomic History   Marital status: Married    Spouse name: Not on file   Number of children: 1   Years of education: Not on file   Highest education level: Not on file  Occupational History   Occupation: Musician  Tobacco Use   Smoking status: Former    Packs/day: 1.00    Years: 25.00    Pack years: 25.00    Types: Cigarettes   Smokeless tobacco: Never  Vaping Use   Vaping Use: Never used  Substance and Sexual Activity   Alcohol use: No    Alcohol/week: 0.0 standard drinks   Drug use: No   Sexual activity: Not on file  Other Topics Concern   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   Social Determinants of Health   Financial Resource Strain: Not on file  Food Insecurity: Not on file  Transportation Needs: Not on file  Physical Activity: Not on file  Stress: Not on file  Social Connections: Not on file     Family History:  The patient's family history includes Heart attack in his father; Heart disease in his father; Thyroid disease in his sister.   ROS:   Please see the history of present illness.    ROS All other systems reviewed and are negative.   PHYSICAL EXAM:   VS:  BP 126/78   Pulse (!) 56   Ht '5\' 10"'$  (1.778 m)   Wt  201 lb 12.8 oz (91.5 kg)   SpO2 98%   BMI 28.96 kg/m    GENERAL:  Well appearing, obese BM in NAD. Walks with a cane. HEENT:  PERRL, EOMI, sclera are clear. Oropharynx is clear. NECK:  No jugular venous distention, carotid upstroke brisk and symmetric, no bruits, no thyromegaly or adenopathy LUNGS:  Clear to auscultation bilaterally CHEST:  Unremarkable HEART:  RRR,  PMI not displaced or sustained,S1 and S2 within normal limits, no S3, no S4: no clicks, no rubs, gr 2/6 SEM at apex ABD:  Soft, nontender. BS +, no masses or bruits. No hepatomegaly, no splenomegaly. Bruising below umbilicus EXT:  2 + pulses throughout, no edema, no cyanosis no clubbing SKIN:  Warm and dry.  No rashes NEURO:  Alert and oriented x 3. Cranial nerves II through XII intact. PSYCH:  Cognitively intact   Wt Readings from Last 3 Encounters:  06/30/21 201 lb 12.8 oz (91.5 kg)  06/18/21 200 lb (90.7 kg)  06/03/21 201 lb (91.2 kg)      Studies/Labs Reviewed:   EKG:  EKG is not ordered today.     Recent Labs: 06/26/2021: ALT 7; BUN 15; Creatinine 1.01; Hemoglobin 10.9; Platelet Count 107; Potassium 3.6; Sodium 138   Lipid Panel    Component Value Date/Time   CHOL 157 05/11/2019 0946   TRIG 201 (H) 05/26/2020 0439   HDL 32 (L) 05/11/2019 0946   CHOLHDL 4.9 05/11/2019 0946   CHOLHDL 3.5 04/28/2010 0703   VLDL 14 04/28/2010 0703   LDLCALC 81 05/11/2019 0946   Dated 02/21/20: creatinine 1.38. potassium 3.5. Hgb 11.3.  Dated 02/03/21: cholesterol 147, triglycerides 268, HDL 29, LDL 64.   Additional studies/ records that were reviewed today include:   Echo 09/20/2015 LV EF: 60% -  65%   Study Conclusions   - Left ventricle: The cavity size was normal. There was moderate   focal basal hypertrophy of the septum. Systolic function was   normal. The estimated ejection fraction was in the range of 60%   to 65%. Wall motion was normal; there were no regional wall   motion abnormalities. Doppler parameters  are consistent with   abnormal left ventricular relaxation (grade 1 diastolic   dysfunction). - Aortic valve: There was moderate to severe regurgitation. - Aorta: Aortic root dimension: 43 mm (ED). - Ascending aorta: The ascending aorta was mildly dilated. - Right ventricle: The cavity size was mildly dilated. Wall   thickness was normal.       Cath 04/16/2016 Conclusion      Mid RCA lesion, 80 %stenosed. Ost LAD to Prox LAD lesion, 25 %stenosed. Dist LAD lesion, 50 %stenosed. There is mild left ventricular systolic dysfunction. LV end diastolic pressure is normal. The left ventricular ejection fraction is 45-50% by visual estimate. There is no mitral valve regurgitation. There is no aortic valve stenosis. LV end diastolic pressure is normal.   1. 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 Mild LV dysfunction. EF 45%. 3. Ascending thoracic aortic aneurysm. 4. 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 5. Normal Right heart  And LV filling pressures 6. Normal cardiac output.    Plan: Aortic root grafting and AVR.         Echo 09/16/2016 LV EF: 55% -   60%   Study Conclusions   - Left ventricle: The cavity size was normal. Wall thickness was   increased in a pattern of severe LVH. Systolic function was   normal. The estimated ejection fraction was in the range of 55%   to 60%. - Aortic valve: Valve area (VTI): 4.29 cm^2. Valve area (Vmax):   4.15 cm^2. Valve area (Vmean): 3.88 cm^2. - Mitral valve: Severely calcified annulus. Valve area by   continuity equation (using LVOT flow): 3.35 cm^2. - Pericardium, extracardiac: Localized moderate pericardial   effusion posterior to the LA.    Echo 05/19/19: IMPRESSIONS     1. Left ventricular ejection fraction, by estimation, is 55 to 60%. The  left ventricle has normal function. The left ventricle has no regional  wall motion abnormalities. There is mild left ventricular hypertrophy of  the basal-septal segment.  Left  ventricular diastolic parameters were normal.   2. Right ventricular systolic function is normal. The right ventricular  size is normal. There is normal pulmonary artery systolic pressure. The  estimated right ventricular systolic pressure is 38.1 mmHg.   3. Left atrial size was mildly dilated.   4. The mitral valve is normal in structure. Mild to moderate mitral valve  regurgitation. No evidence of mitral stenosis.   5. The aortic valve has been repaired/replaced. Aortic valve  regurgitation is not visualized. No aortic stenosis is present. 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 Procedure  Date: 2018. Echo findings are consistent  with normal structure and function of the aortic valve prosthesis. Aortic  valve mean gradient measures 10.8 mmHg. Aortic valve Vmax measures 2.12  m/s.   6. Aortic root/ascending aorta has been repaired/replaced.   7. The inferior vena cava is normal in size with greater than 50%  respiratory variability, suggesting right atrial pressure of 3 mmHg.   Comparison(s): Prior images unable to be directly viewed, comparison made  by report only.    CT  ANGIOGRAPHY CHEST WITH CONTRAST   TECHNIQUE: Multidetector CT imaging of the chest was performed using the standard protocol during bolus administration of intravenous contrast. Multiplanar CT image reconstructions and MIPs were obtained to evaluate the vascular anatomy.   CONTRAST:  85m ISOVUE-370 IOPAMIDOL (ISOVUE-370) INJECTION 76%   Creatinine was obtained on site at [facility] [address]   Results: Creatinine 1.1 mg/dL.   COMPARISON:  11/17/2018   FINDINGS: Cardiovascular: Previous median sternotomy and aortic valve repair. The heart size appears normal. Mild aortic atherosclerosis.   The aortic root measures 4.2 cm in diameter, image 86/10. Unchanged from previous exam.   The sino-tubular junction measures 3 cm in diameter, image  79/10. Unchanged.   The ascending thoracic aorta measures 3.9 cm, image 80/10. Unchanged from previous exam.   The transverse aortic arch measures 4.2 cm, image 131/12. Unchanged from previous exam   The descending thoracic aorta measures 2.9 cm, image 131/10. Stable.   No signs of aortic dissection. The great vessels appear widely patent.   The main pulmonary artery is patent. No obstructing pulmonary embolus identified.   Mediastinum/Nodes: Normal appearance of the thyroid gland. The trachea appears patent and is midline. Normal appearance of the esophagus. No supraclavicular, axillary, mediastinal, or hilar lymph nodes.   Lungs/Pleura: No pleural effusion. No airspace consolidation. Posterolateral left upper lobe scar is unchanged.   Upper Abdomen: No acute abnormality. Gallstones. Left upper pole renal calcification measures 1.1 cm.   Musculoskeletal: No acute findings.   Review of the MIP images confirms the above findings.   IMPRESSION: 1. 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 Recommend semi-annual imaging followup by CTA or MRA and referral to cardiothoracic surgery if not already obtained. This recommendation follows 2010 ACCF/AHA/AATS/ACR/ASA/SCA/SCAI/SIR/STS/SVM Guidelines for the Diagnosis and Management of Patients With Thoracic Aortic Disease. Circulation. 2010; 121:: K812-X51 Aortic aneurysm NOS (ICD10-I71.9) 2. Status post aortic valve repair. 3. Gallstones. 4. Nonobstructing left renal calculus. 5. Aortic atherosclerosis.   Aortic Atherosclerosis (ICD10-I70.0).     Electronically Signed   By: TKerby MoorsM.D.   On: 11/30/2019 10:02   Myoview 09/06/20: Study Highlights    The left ventricular ejection fraction is moderately decreased (30-44%). Nuclear stress EF: 42%. There was no ST segment deviation noted during stress. Defect 1: There is a medium defect of moderate severity present in  the basal inferior, mid inferior and apex location. Defect 2: There is a small defect of moderate severity present in the apical inferior and apical lateral location. Findings consistent with ischemia. This is an intermediate risk study.   Abnormal study. TID is 1.21, cannot exclude balanced ischemia. There is a fixed defect in the inferior wall and apex that is moderately severe, with mild-moderate hypokinesis in this area. There is a separate small reversible defect of moderate severity in the apical and inferior regions suggestive of ischemia. EF is moderately decreased, though would consider echo for definitive evaluation of function and wall motion.    Echo 09/16/20: IMPRESSIONS     1. Left ventricular ejection fraction, by estimation, is 65 to 70%. The  left ventricle has normal function. The left ventricle has no regional  wall motion abnormalities. There is mild left ventricular hypertrophy.  Left ventricular diastolic parameters  were normal.   2. Right ventricular systolic function is normal. The right ventricular  size is normal.   3. Mild mitral valve regurgitation.   4. S/P AVR with 29 mm MagnaEase  bioprosthesis (09/15/16) Peak and mean  gradients throuugh the valve aer 22 and 11 mm Hg respectively. No  significant change from report of April 2021. . The aortic valve has been  repaired/replaced. Aortic valve  regurgitation is not visualized.  ASSESSMENT:    1. Coronary artery disease of native artery of native heart with stable angina pectoris ([place])   2. S/P AVR (aortic valve replacement)   3. Abdominal aortic aneurysm (AAA) without rupture, unspecified part ([place])   4. Iliac artery aneurysm, bilateral ([place])   5. Chronic ITP (idiopathic thrombocytopenia) (HCC)   6. Aneurysm of ascending aorta without rupture (HCC)         PLAN:  In order of problems listed above:  H/o Aortic root repair and AVR with a tissue valve.:   Repeat Echo in March 2021 showed good prosthetic  valve function. Repeat CT in November showed stable 4.3 cm aneurysm.  Echo in August 2022 OK   CAD: asymptomatic.  80% mid nondominant RCA and a 50% distal LAD lesion on previous cardiac catheterization in 2018.  Abnormal myoview with some inferior and apical ischemia. This was an intermediate risk study. Ordinarily would consider repeat coronary angiography but his bleeding risk is high with ITP and recurrent major lower GI bleeds. He is a poor candidate for PCI because of this. I would not recommend invasive evaluation unless he were to have progressive angina despite medical therapy.   Hypertension: Blood pressure is under good control. On atenolol and amlodipine.   Hyperlipidemia:   6.   ITP followed by Dr [name].   7.   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 Followed by [facility] Dr Donzetta Matters  9.  S/p lap choly and umbilical hernia repair.   10. Recurrent GI bleed- diverticular. S/p coil embolization.   Medication Adjustments/Labs and Tests Ordered: Current medicines are reviewed at length with the patient today.  Concerns regarding medicines are outlined above.  Medication changes, Labs and Tests ordered today are listed in the Patient Instructions below. Patient Instructions  Medication Instructions:  Your physician recommends that you continue on your current medications as directed. Please refer to the Current Medication list given to you today.  *If you need a refill on your cardiac medications before your next appointment, please call your pharmacy*    Follow-Up: At [facility], you and your health needs are our priority.  As part of our continuing mission to provide you with exceptional heart care, we have created designated Provider Care Teams.  These Care Teams include your primary Cardiologist (physician) and Advanced Practice Providers (APPs -  Physician Assistants and Nurse Practitioners) who all work together to provide you with the care you need, when you need it.  We  recommend signing up for the patient portal called "MyChart".  Sign up information is provided on this After Visit Summary.  MyChart is used to connect with patients for Virtual Visits (Telemedicine).  Patients are able to view lab/test results, encounter notes, upcoming appointments, etc.  Non-urgent messages can be sent to your provider as well.   To learn more about what you can do with MyChart, go to [email or web address].    Your next appointment:   6 month(s)  The format for your next appointment:   In Person  Provider:   Kathreen Dileo Martinique, MD     Signed, Kadon Andrus Martinique, MD  06/30/2021 12:05 PM    [address], [place], [place]  [address] Phone: [phone]; Fax: [phone]   

## 2021-06-26 ENCOUNTER — Encounter: Payer: Self-pay | Admitting: *Deleted

## 2021-06-26 ENCOUNTER — Inpatient Hospital Stay: Payer: Medicare Other | Attending: Hematology & Oncology

## 2021-06-26 ENCOUNTER — Inpatient Hospital Stay: Payer: Medicare Other

## 2021-06-26 DIAGNOSIS — Z79899 Other long term (current) drug therapy: Secondary | ICD-10-CM | POA: Insufficient documentation

## 2021-06-26 DIAGNOSIS — D509 Iron deficiency anemia, unspecified: Secondary | ICD-10-CM | POA: Diagnosis not present

## 2021-06-26 DIAGNOSIS — D693 Immune thrombocytopenic purpura: Secondary | ICD-10-CM | POA: Diagnosis not present

## 2021-06-26 LAB — CBC WITH DIFFERENTIAL (CANCER CENTER ONLY)
Abs Immature Granulocytes: 0.04 10*3/uL (ref 0.00–0.07)
Basophils Absolute: 0 10*3/uL (ref 0.0–0.1)
Basophils Relative: 1 %
Eosinophils Absolute: 0.2 10*3/uL (ref 0.0–0.5)
Eosinophils Relative: 3 %
HCT: 35.2 % — ABNORMAL LOW (ref 39.0–52.0)
Hemoglobin: 10.9 g/dL — ABNORMAL LOW (ref 13.0–17.0)
Immature Granulocytes: 1 %
Lymphocytes Relative: 17 %
Lymphs Abs: 1.1 10*3/uL (ref 0.7–4.0)
MCH: 27.7 pg (ref 26.0–34.0)
MCHC: 31 g/dL (ref 30.0–36.0)
MCV: 89.6 fL (ref 80.0–100.0)
Monocytes Absolute: 0.7 10*3/uL (ref 0.1–1.0)
Monocytes Relative: 10 %
Neutro Abs: 4.5 10*3/uL (ref 1.7–7.7)
Neutrophils Relative %: 68 %
Platelet Count: 107 10*3/uL — ABNORMAL LOW (ref 150–400)
RBC: 3.93 MIL/uL — ABNORMAL LOW (ref 4.22–5.81)
RDW: 17.3 % — ABNORMAL HIGH (ref 11.5–15.5)
WBC Count: 6.5 10*3/uL (ref 4.0–10.5)
nRBC: 0 % (ref 0.0–0.2)

## 2021-06-26 LAB — CMP (CANCER CENTER ONLY)
ALT: 7 U/L (ref 0–44)
AST: 18 U/L (ref 15–41)
Albumin: 4.7 g/dL (ref 3.5–5.0)
Alkaline Phosphatase: 42 U/L (ref 38–126)
Anion gap: 9 (ref 5–15)
BUN: 15 mg/dL (ref 8–23)
CO2: 27 mmol/L (ref 22–32)
Calcium: 9.7 mg/dL (ref 8.9–10.3)
Chloride: 102 mmol/L (ref 98–111)
Creatinine: 1.01 mg/dL (ref 0.61–1.24)
GFR, Estimated: >60 mL/min (ref >60)
Glucose, Bld: 91 mg/dL (ref 70–99)
Potassium: 3.6 mmol/L (ref 3.5–5.1)
Sodium: 138 mmol/L (ref 135–145)
Total Bilirubin: 0.5 mg/dL (ref 0.3–1.2)
Total Protein: 7.6 g/dL (ref 6.5–8.1)

## 2021-06-26 NOTE — Progress Notes (Signed)
Patient platelets 107.  No Nplate today per Dr [name].  Patient was 1/2 hour late for appt today so IV iron couldn't be given today but would be given next week at his appt.  PAtient is understanding and will get iron next week.

## 2021-06-30 ENCOUNTER — Ambulatory Visit (INDEPENDENT_AMBULATORY_CARE_PROVIDER_SITE_OTHER): Payer: Medicare Other | Admitting: Cardiology

## 2021-06-30 ENCOUNTER — Encounter: Payer: Self-pay | Admitting: Cardiology

## 2021-06-30 ENCOUNTER — Ambulatory Visit: Payer: Self-pay | Admitting: *Deleted

## 2021-06-30 VITALS — BP 126/78 | HR 56 | Ht 70.0 in | Wt 201.8 lb

## 2021-06-30 DIAGNOSIS — I7121 Aneurysm of the ascending aorta, without rupture: Secondary | ICD-10-CM | POA: Diagnosis not present

## 2021-06-30 DIAGNOSIS — I714 Abdominal aortic aneurysm, without rupture, unspecified: Secondary | ICD-10-CM

## 2021-06-30 DIAGNOSIS — I723 Aneurysm of iliac artery: Secondary | ICD-10-CM

## 2021-06-30 DIAGNOSIS — Z952 Presence of prosthetic heart valve: Secondary | ICD-10-CM

## 2021-06-30 DIAGNOSIS — D693 Immune thrombocytopenic purpura: Secondary | ICD-10-CM | POA: Diagnosis not present

## 2021-06-30 DIAGNOSIS — I25118 Atherosclerotic heart disease of native coronary artery with other forms of angina pectoris: Secondary | ICD-10-CM

## 2021-06-30 NOTE — Patient Instructions (Signed)
Medication Instructions:  Your physician recommends that you continue on your current medications as directed. Please refer to the Current Medication list given to you today.  *If you need a refill on your cardiac medications before your next appointment, please call your pharmacy*  Follow-Up: At [facility], you and your health needs are our priority.  As part of our continuing mission to provide you with exceptional heart care, we have created designated Provider Care Teams.  These Care Teams include your primary Cardiologist (physician) and Advanced Practice Providers (APPs -  Physician Assistants and Nurse Practitioners) who all work together to provide you with the care you need, when you need it.  We recommend signing up for the patient portal called "MyChart".  Sign up information is provided on this After Visit Summary.  MyChart is used to connect with patients for Virtual Visits (Telemedicine).  Patients are able to view lab/test results, encounter notes, upcoming appointments, etc.  Non-urgent messages can be sent to your provider as well.   To learn more about what you can do with MyChart, go to [email or web address].com.    Your next appointment:   6 month(s)  The format for your next appointment:   In Person  Provider:   Peter Jordan, MD   

## 2021-07-01 ENCOUNTER — Other Ambulatory Visit: Payer: Self-pay | Admitting: *Deleted

## 2021-07-01 DIAGNOSIS — H04123 Dry eye syndrome of bilateral lacrimal glands: Secondary | ICD-10-CM | POA: Diagnosis not present

## 2021-07-01 DIAGNOSIS — H401231 Low-tension glaucoma, bilateral, mild stage: Secondary | ICD-10-CM | POA: Diagnosis not present

## 2021-07-01 NOTE — Patient Outreach (Signed)
[facility]) Care Management  07/01/2021  Lonnie Hansen [birth year] [id number]    Outgoing call placed to member, state this is not a good time as he is still asleep.  Report he has several doctor's appointments this week, will call back once all appointments are complete.  Will follow up within 5-7 business days if no call back.  [name], RN, MSN, [place] Manager [phone]

## 2021-07-03 ENCOUNTER — Other Ambulatory Visit: Payer: Self-pay | Admitting: Oncology

## 2021-07-03 ENCOUNTER — Inpatient Hospital Stay: Payer: Medicare Other

## 2021-07-03 VITALS — BP 147/82 | HR 65 | Temp 98.8°F | Resp 19

## 2021-07-03 DIAGNOSIS — D509 Iron deficiency anemia, unspecified: Secondary | ICD-10-CM | POA: Diagnosis not present

## 2021-07-03 DIAGNOSIS — D693 Immune thrombocytopenic purpura: Secondary | ICD-10-CM | POA: Diagnosis not present

## 2021-07-03 DIAGNOSIS — Z79899 Other long term (current) drug therapy: Secondary | ICD-10-CM | POA: Diagnosis not present

## 2021-07-03 LAB — CBC WITH DIFFERENTIAL (CANCER CENTER ONLY)
Abs Immature Granulocytes: 0.02 10*3/uL (ref 0.00–0.07)
Basophils Absolute: 0 10*3/uL (ref 0.0–0.1)
Basophils Relative: 0 %
Eosinophils Absolute: 0.2 10*3/uL (ref 0.0–0.5)
Eosinophils Relative: 3 %
HCT: 34.6 % — ABNORMAL LOW (ref 39.0–52.0)
Hemoglobin: 10.7 g/dL — ABNORMAL LOW (ref 13.0–17.0)
Immature Granulocytes: 0 %
Lymphocytes Relative: 20 %
Lymphs Abs: 1.2 10*3/uL (ref 0.7–4.0)
MCH: 27.6 pg (ref 26.0–34.0)
MCHC: 30.9 g/dL (ref 30.0–36.0)
MCV: 89.4 fL (ref 80.0–100.0)
Monocytes Absolute: 0.6 10*3/uL (ref 0.1–1.0)
Monocytes Relative: 9 %
Neutro Abs: 4.2 10*3/uL (ref 1.7–7.7)
Neutrophils Relative %: 68 %
Platelet Count: 82 10*3/uL — ABNORMAL LOW (ref 150–400)
RBC: 3.87 MIL/uL — ABNORMAL LOW (ref 4.22–5.81)
RDW: 17.4 % — ABNORMAL HIGH (ref 11.5–15.5)
WBC Count: 6.2 10*3/uL (ref 4.0–10.5)
nRBC: 0 % (ref 0.0–0.2)

## 2021-07-03 LAB — CMP (CANCER CENTER ONLY)
ALT: 8 U/L (ref 0–44)
AST: 17 U/L (ref 15–41)
Albumin: 4.5 g/dL (ref 3.5–5.0)
Alkaline Phosphatase: 41 U/L (ref 38–126)
Anion gap: 8 (ref 5–15)
BUN: 17 mg/dL (ref 8–23)
CO2: 27 mmol/L (ref 22–32)
Calcium: 9.6 mg/dL (ref 8.9–10.3)
Chloride: 104 mmol/L (ref 98–111)
Creatinine: 1.11 mg/dL (ref 0.61–1.24)
GFR, Estimated: >60 mL/min (ref >60)
Glucose, Bld: 102 mg/dL — ABNORMAL HIGH (ref 70–99)
Potassium: 3.8 mmol/L (ref 3.5–5.1)
Sodium: 139 mmol/L (ref 135–145)
Total Bilirubin: 0.6 mg/dL (ref 0.3–1.2)
Total Protein: 7.1 g/dL (ref 6.5–8.1)

## 2021-07-03 MED ORDER — ROMIPLOSTIM 125 MCG ~~LOC~~ SOLR
95.0000 ug | Freq: Once | SUBCUTANEOUS | Status: AC
  Administered 2021-07-03: 95 ug via SUBCUTANEOUS
  Filled 2021-07-03: qty 0.19

## 2021-07-03 MED ORDER — SODIUM CHLORIDE 0.9 % IV SOLN: Freq: Once | INTRAVENOUS | Status: AC

## 2021-07-03 MED ORDER — SODIUM CHLORIDE 0.9 % IV SOLN
300.0000 mg | Freq: Once | INTRAVENOUS | Status: AC
  Administered 2021-07-03: 300 mg via INTRAVENOUS
  Filled 2021-07-03: qty 300

## 2021-07-03 NOTE — Patient Instructions (Signed)
Iron Sucrose Injection What is this medication? IRON SUCROSE (EYE ern SOO krose) treats low levels of iron (iron deficiency anemia) in people with kidney disease. Iron is a mineral that plays an important role in making red blood cells, which carry oxygen from your lungs to the rest of your body. This medicine may be used for other purposes; ask your health care provider or pharmacist if you have questions. COMMON BRAND NAME(S): Venofer What should I tell my care team before I take this medication? They need to know if you have any of these conditions: Anemia not caused by low iron levels Heart disease High levels of iron in the blood Kidney disease Liver disease An unusual or allergic reaction to iron, other medications, foods, dyes, or preservatives Pregnant or trying to get pregnant Breast-feeding How should I use this medication? This medication is for infusion into a vein. It is given in a hospital or clinic setting. Talk to your care team about the use of this medication in children. While this medication may be prescribed for children as young as 2 years for selected conditions, precautions do apply. Overdosage: If you think you have taken too much of this medicine contact a poison control center or emergency room at once. NOTE: This medicine is only for you. Do not share this medicine with others. What if I miss a dose? It is important not to miss your dose. Call your care team if you are unable to keep an appointment. What may interact with this medication? Do not take this medication with any of the following: Deferoxamine Dimercaprol Other iron products This medication may also interact with the following: Chloramphenicol Deferasirox This list may not describe all possible interactions. Give your health care provider a list of all the medicines, herbs, non-prescription drugs, or dietary supplements you use. Also tell them if you smoke, drink alcohol, or use illegal  drugs. Some items may interact with your medicine. What should I watch for while using this medication? Visit your care team regularly. Tell your care team if your symptoms do not start to get better or if they get worse. You may need blood work done while you are taking this medication. You may need to follow a special diet. Talk to your care team. Foods that contain iron include: whole grains/cereals, dried fruits, beans, or peas, leafy green vegetables, and organ meats (liver, kidney). What side effects may I notice from receiving this medication? Side effects that you should report to your care team as soon as possible: Allergic reactions--skin rash, itching, hives, swelling of the face, lips, tongue, or throat Low blood pressure--dizziness, feeling faint or lightheaded, blurry vision Shortness of breath Side effects that usually do not require medical attention (report to your care team if they continue or are bothersome): Flushing Headache Joint pain Muscle pain Nausea Pain, redness, or irritation at injection site This list may not describe all possible side effects. Call your doctor for medical advice about side effects. You may report side effects to FDA at 1-800-FDA-1088. Where should I keep my medication? This medication is given in a hospital or clinic and will not be stored at home. NOTE: This sheet is a summary. It may not cover all possible information. If you have questions about this medicine, talk to your doctor, pharmacist, or health care provider.  2023 Elsevier/Gold Standard (2020-06-07 00:00:00) Romiplostim injection What is this medication? ROMIPLOSTIM (roe mi PLOE stim) helps your body make more platelets. This medicine is  used to treat low platelets caused by chronic idiopathic thrombocytopenic purpura (ITP) or a bone marrow syndrome caused by radiation sickness. This medicine may be used for other purposes; ask your health care provider or pharmacist if you have  questions. COMMON BRAND NAME(S): Nplate What should I tell my care team before I take this medication? They need to know if you have any of these conditions: blood clots myelodysplastic syndrome an unusual or allergic reaction to romiplostim, mannitol, other medicines, foods, dyes, or preservatives pregnant or trying to get pregnant breast-feeding How should I use this medication? This medicine is injected under the skin. It is given by a health care provider in a hospital or clinic setting. A special MedGuide will be given to you before each treatment. Be sure to read this information carefully each time. Talk to your health care provider about the use of this medicine in children. While it may be prescribed for children as young as newborns for selected conditions, precautions do apply. Overdosage: If you think you have taken too much of this medicine contact a poison control center or emergency room at once. NOTE: This medicine is only for you. Do not share this medicine with others. What if I miss a dose? Keep appointments for follow-up doses. It is important not to miss your dose. Call your health care provider if you are unable to keep an appointment. What may interact with this medication? Interactions are not expected. This list may not describe all possible interactions. Give your health care provider a list of all the medicines, herbs, non-prescription drugs, or dietary supplements you use. Also tell them if you smoke, drink alcohol, or use illegal drugs. Some items may interact with your medicine. What should I watch for while using this medication? Visit your health care provider for regular checks on your progress. You may need blood work done while you are taking this medicine. Your condition will be monitored carefully while you are receiving this medicine. It is important not to miss any appointments. What side effects may I notice from receiving this medication? Side effects  that you should report to your doctor or health care professional as soon as possible: allergic reactions (skin rash, itching or hives; swelling of the face, lips, or tongue) bleeding (bloody or black, tarry stools; red or dark brown urine; spitting up blood or brown material that looks like coffee grounds; red spots on the skin; unusual bruising or bleeding from the eyes, gums, or nose) blood clot (chest pain; shortness of breath; pain, swelling, or warmth in the leg) stroke (changes in vision; confusion; trouble speaking or understanding; severe headaches; sudden numbness or weakness of the face, arm or leg; trouble walking; dizziness; loss of balance or coordination) Side effects that usually do not require medical attention (report to your doctor or health care professional if they continue or are bothersome): diarrhea dizziness headache joint pain muscle pain stomach pain trouble sleeping This list may not describe all possible side effects. Call your doctor for medical advice about side effects. You may report side effects to FDA at 1-800-FDA-1088. Where should I keep my medication? This medicine is given in a hospital or clinic. It will not be stored at home. NOTE: This sheet is a summary. It may not cover all possible information. If you have questions about this medicine, talk to your doctor, pharmacist, or health care provider.  2023 Elsevier/Gold Standard (2020-12-13 00:00:00)

## 2021-07-09 ENCOUNTER — Other Ambulatory Visit: Payer: Self-pay | Admitting: *Deleted

## 2021-07-09 NOTE — Patient Outreach (Signed)
[facility]) Care Management  07/09/2021  Lonnie Hansen January 29, 1950 [id number]   Outreach attempt #2, unsuccessful, HIPAA compliant voice message left.  Will follow up within the next 5-7 business days.  [name], RN, MSN, [place] Manager [phone]

## 2021-07-16 ENCOUNTER — Other Ambulatory Visit: Payer: Self-pay | Admitting: *Deleted

## 2021-07-16 NOTE — Patient Outreach (Signed)
[facility]) Care Management Telephonic RN Care Manager Note   07/16/2021 Name:  RAMIREZ FULLBRIGHT MRN:  [id number] DOB:  1950-06-20  Summary: Outreach attempt #3, successful to member.  Denies any urgent concerns, encouraged to contact this care manager with questions.    Subjective: Lonnie Hansen is an 71 y.o. year old male who is a primary patient of Lonnie Fillers, MD. The care management team was consulted for assistance with care management and/or care coordination needs.    Telephonic RN Care Manager completed Telephone Visit today.  Objective:   Medications Reviewed Today     Reviewed by [place], [name], MD (Physician) on 06/30/21 at 1148  Med List Status: <None>   Medication Order Taking? Sig Documenting Provider Last Dose Status Informant  acetaminophen (TYLENOL) 500 MG tablet [id number] Yes Take 1,000 mg by mouth daily as needed (pain). [provider] Taking Active Self  amLODipine (NORVASC) 10 MG tablet [id number] Yes Take 10 mg by mouth daily. [provider] Taking Active   ANUCORT-HC 25 MG suppository [id number] Yes Place 1 suppository rectally daily as needed for pain. [provider] Taking Active Self  atenolol (TENORMIN) 25 MG tablet [id number] Yes Take 12.5 mg by mouth 2 (two) times daily. [name], MD Taking Active Self  ferrous sulfate 325 (65 FE) MG tablet [id number] Yes Take 325 mg by mouth daily. [provider] Taking Active Self           Med Note (Hansen, Lonnie C   Mon May 26, 2021 10:35 AM) Pt stated he does not believe he is taking this dose. Pt stated he is taking iron daily but not this dose.   fluticasone (FLONASE) 50 MCG/ACT nasal spray [id number] Yes Place 2 sprays into both nostrils daily as needed for allergies or rhinitis. [provider] Taking Active Self  latanoprost (XALATAN) 0.005 % ophthalmic solution [id number] Yes Place 1 drop into both eyes 2 (two) times daily. [provider] Taking Active Self  Multiple Vitamin (MULTIVITAMIN WITH MINERALS) TABS tablet [id number] Yes Take 1 tablet by mouth daily. [provider] Taking Active Self           Med Note Hansen Economy, Lonnie T   Tue Oct 06, 2016 10:16 AM)    pantoprazole (PROTONIX) 40 MG tablet [id number] Yes Take 40 mg by mouth 2 (two) times daily. [provider] Taking Active Self  polyethylene glycol (MIRALAX / GLYCOLAX) 17 g packet [id number] Yes Take 17 g by mouth daily.  Patient taking differently: Take 17 g by mouth See admin instructions. Every other day   Lonnie Mody, MD Taking Active Self  psyllium (HYDROCIL/METAMUCIL) 95 % PACK [id number] Yes Take 1 packet by mouth daily. Lonnie Mody, MD Taking Active Self  rosuvastatin (CRESTOR) 20 MG tablet [id number] Yes Take 20 mg by mouth daily. [provider] Taking Active Self, Spouse/Significant Other           Med Note (Hansen, Lonnie C   Mon May 26, 2021 10:36 AM) Patient stated he has both 10 mg and 20 mg tablets at home. Patient stated he sometimes takes two 10 mg tablets to meet his 20 mg dose.   triamcinolone cream (KENALOG) 0.1 % [id number] Yes Apply 1 application. topically 3 (three) times daily as needed (facial itching/rash). [provider] Taking Active Self             SDOH:  (Social Determinants of Health) assessments and interventions performed:  Care Plan  Review of patient past medical history, allergies, medications, health status, including review of consultants reports, laboratory and other test data, was performed as part of comprehensive evaluation for care management services.   Care Plan : General Plan of Care (Adult)  Updates made by [name], RN since 07/16/2021 12:00 AM     Problem: Knowledge deficit related to management of chronic medical conditions (ITP and HTN)   Priority: High     Long-Range Goal: Patient will verbalize and display chronic health management evidenced by no  hospital admissions in the next year (goal date extended, readmitted) Completed 07/16/2021  Start Date: 09/19/2020  Expected End Date: 09/19/2021  This Visit's Progress: On track  Recent Progress: On track  Priority: High  Note:   Current Barriers:  Knowledge Deficits related to plan of care for management of HTN, HLD, and ITP  Chronic Disease Management support and education needs related to HTN, HLD, and ITP  RNCM Clinical Goal(s):  Patient will verbalize understanding of plan for management of HTN and ITP take all medications exactly as prescribed and will call provider for medication related questions attend all scheduled medical appointments: PCP, Cardiology, and Hematology continue to work with RN Care Manager to address care management and care coordination needs related to HTN and ITP  through collaboration with RN Care manager, provider, and care team.   Interventions: Inter-dis1:1 collaboration with primary care provider regarding development and update of comprehensive plan of care as evidenced by provider attestation and co-signatureciplinary care team collaboration (see longitudinal plan of care) Evaluation of current treatment plan related to  self management and patient's adherence to plan as established by provider   Hypertension: (Status: Condition stable. Not addressed this visit.) Last practice recorded BP readings:  BP Readings from Last 3 Encounters:  07/03/21 (!) 147/82  06/30/21 126/78  06/19/21 135/76  Most recent eGFR/CrCl: No results found for: EGFR  No components found for: CRCL  Evaluation of current treatment plan related to hypertension self management and patient's adherence to plan as established by provider;   Reviewed prescribed diet low salt Advised patient, providing education and rationale, to monitor blood pressure daily and record, calling PCP for findings outside established parameters;  Discussed complications of poorly controlled blood pressure  such as heart disease, stroke, circulatory complications, vision complications, kidney impairment, sexual dysfunction;  Screening for signs and symptoms of depression related to chronic disease state;   11/8 - Admits that he is not consistent with monitoring BP daily.  Strongly encouraged to do so and record readings, state he will try.     Hyperlipidemia Interventions:  (Status:  Goal on track:  Yes.) Long Term Goal Medication review performed; medication list updated in electronic medical record.  Provider established cholesterol goals reviewed Counseled on importance of regular laboratory monitoring as prescribed Provided HLD educational materials  ITPGoal on track:  Yes. Evaluation of current treatment plan related to  ITP , self-management and patient's adherence to plan as established by provider. Discussed plans with patient for ongoing care management follow up and provided patient with direct contact information for care management team Advised patient to call provider with any signs of bleeding.  Platetlets in August were 99,000, on 11/3 decreased to 78,000; Provided education to patient re: management of ITP and chronic anemia; Reviewed medications with patient and discussed schedule of follow up, including next set of labs and pending injection;  11/8 - Member state he has several follow up appointments in the next  few weeks.  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  State he is doing well, no complications post op.  Was seen by surgeon on 12/2, no follow up needed.  Will see hematology and cardiology today, last platelet count was 92 and stable.  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  Will have appointment with PCP next week, will contact this care manager with any concerns.   Update 2/10 - Annual PCP visit completed on 1/24, member report only concern was elevated Triglycerides.  Educated on proper diet and exercise, verbalizes understanding.  Will follow up with hematology on 3/3, last platelet count was 97 in January, injection received.  He is hoping to be able to keep count greater than 100 with minimal injections.  Otherwise, no concerns voiced.   Update 3/14 - Spoke with member, report he has adjusted diet to low cholesterol/heart healthy diet.  He received another injection a couple weeks ago for decreased platelet count of 76.  He is scheduled for repeat labs on 3/17 with injection.  This will be every 2 weeks to keep level stable.  He voices concern regarding known AAA.  Was last seen by vascular in April last year, size of AAA was 5 cm with potential plan for intervention if it became greater than 5.5 cm.  He has not had 6 month follow up as suggested, provided with contact information to office and encouraged to call for appointment.  State he will do so today (update - done, test scheduled for 3/17, office visit on 3/22).  Denies any pain or discomfort at this time.   Update 4/13 - Member was seen in ED today for rectal bleeding, left without being seen after waiting in lobby for 8 hours.  State he called his provider at [place] and was prescribed a steroid suppository and given an appointment to come in the office in the next 2 weeks.  He is aware of the need to seek medical attention should bleeding get worse, will try urgent care or one of the [facility].  State his blood levels  were "fine" in the ED.  Denies any dizziness or fatigue.    Cardiology appointment to assess AAA completed, decision per notes is to continue to monitor and re-assess in the next few months.     Update 4/20 - Was admitted to hospital 4/15-4/19 for bleeding diverticulosis, received 1 unit PRBC to keep hemoglobin greater than 8.  He has follow up with hematology office on 5/3.  Denies any current bleeding.  There was no surgical intervention at the time, report surgeon was fearful of complications due to his AAA.  He will follow up with cardiothoracic surgeon on 5/24 and will call vascular surgeon to schedule follow up.  He will also call PCP to advise of admission and schedule follow up.     Update 5/8 - Member was readmitted to hospital 5/1-5/5 for recurrent GI bleeding, requiring more  blood transfusions to keep hemoglobin stable.  Coil embolization completed, will follow up with IR.  Per notes, general surgery will consider hemicolectomy if bleeding persists after recent interventions.  Has visit with hematology tomorrow for follow up labs and possible injection.  Still has CTS scheduled for 5/24 and regular cardiology on 6/5.  Does not have appointments scheduled with PCP or GI yet, will call to schedule.  Denies any bleeding since embolization was done.     Update 6/21 - Report he is doing well, no bleeding since admission last month, cleared by GI.  Will see hematology tomorrow to recheck platelet levels.  Denies any dizziness or signs of anemia.          Plan:  No further follow up required: Goal met, case closed  [name], RN, MSN, [place] Manager [phone]

## 2021-07-17 ENCOUNTER — Inpatient Hospital Stay: Payer: Medicare Other

## 2021-07-17 DIAGNOSIS — D509 Iron deficiency anemia, unspecified: Secondary | ICD-10-CM | POA: Diagnosis not present

## 2021-07-17 DIAGNOSIS — D693 Immune thrombocytopenic purpura: Secondary | ICD-10-CM | POA: Diagnosis not present

## 2021-07-17 DIAGNOSIS — Z79899 Other long term (current) drug therapy: Secondary | ICD-10-CM | POA: Diagnosis not present

## 2021-07-17 LAB — CBC WITH DIFFERENTIAL (CANCER CENTER ONLY)
Abs Immature Granulocytes: 0.02 10*3/uL (ref 0.00–0.07)
Basophils Absolute: 0 10*3/uL (ref 0.0–0.1)
Basophils Relative: 0 %
Eosinophils Absolute: 0.2 10*3/uL (ref 0.0–0.5)
Eosinophils Relative: 3 %
HCT: 36.9 % — ABNORMAL LOW (ref 39.0–52.0)
Hemoglobin: 11.7 g/dL — ABNORMAL LOW (ref 13.0–17.0)
Immature Granulocytes: 0 %
Lymphocytes Relative: 15 %
Lymphs Abs: 0.9 10*3/uL (ref 0.7–4.0)
MCH: 28.4 pg (ref 26.0–34.0)
MCHC: 31.7 g/dL (ref 30.0–36.0)
MCV: 89.6 fL (ref 80.0–100.0)
Monocytes Absolute: 0.7 10*3/uL (ref 0.1–1.0)
Monocytes Relative: 13 %
Neutro Abs: 4 10*3/uL (ref 1.7–7.7)
Neutrophils Relative %: 69 %
Platelet Count: 117 10*3/uL — ABNORMAL LOW (ref 150–400)
RBC: 4.12 MIL/uL — ABNORMAL LOW (ref 4.22–5.81)
RDW: 17.1 % — ABNORMAL HIGH (ref 11.5–15.5)
WBC Count: 5.8 10*3/uL (ref 4.0–10.5)
nRBC: 0 % (ref 0.0–0.2)

## 2021-07-17 LAB — CMP (CANCER CENTER ONLY)
ALT: 31 U/L (ref 0–44)
AST: 31 U/L (ref 15–41)
Albumin: 4.6 g/dL (ref 3.5–5.0)
Alkaline Phosphatase: 54 U/L (ref 38–126)
Anion gap: 7 (ref 5–15)
BUN: 16 mg/dL (ref 8–23)
CO2: 29 mmol/L (ref 22–32)
Calcium: 9.9 mg/dL (ref 8.9–10.3)
Chloride: 102 mmol/L (ref 98–111)
Creatinine: 1.16 mg/dL (ref 0.61–1.24)
GFR, Estimated: >60 mL/min (ref >60)
Glucose, Bld: 107 mg/dL — ABNORMAL HIGH (ref 70–99)
Potassium: 4.4 mmol/L (ref 3.5–5.1)
Sodium: 138 mmol/L (ref 135–145)
Total Bilirubin: 0.6 mg/dL (ref 0.3–1.2)
Total Protein: 7.6 g/dL (ref 6.5–8.1)

## 2021-07-30 ENCOUNTER — Inpatient Hospital Stay: Payer: Medicare Other

## 2021-07-30 ENCOUNTER — Inpatient Hospital Stay (HOSPITAL_BASED_OUTPATIENT_CLINIC_OR_DEPARTMENT_OTHER): Payer: Medicare Other | Admitting: Family

## 2021-07-30 ENCOUNTER — Inpatient Hospital Stay: Payer: Medicare Other | Attending: Hematology & Oncology

## 2021-07-30 ENCOUNTER — Encounter: Payer: Self-pay | Admitting: Family

## 2021-07-30 ENCOUNTER — Other Ambulatory Visit: Payer: Self-pay

## 2021-07-30 VITALS — BP 142/77 | HR 55 | Temp 98.8°F | Resp 17

## 2021-07-30 DIAGNOSIS — D693 Immune thrombocytopenic purpura: Secondary | ICD-10-CM

## 2021-07-30 DIAGNOSIS — R5383 Other fatigue: Secondary | ICD-10-CM | POA: Diagnosis not present

## 2021-07-30 DIAGNOSIS — Z79899 Other long term (current) drug therapy: Secondary | ICD-10-CM | POA: Insufficient documentation

## 2021-07-30 DIAGNOSIS — D509 Iron deficiency anemia, unspecified: Secondary | ICD-10-CM | POA: Diagnosis not present

## 2021-07-30 LAB — CBC WITH DIFFERENTIAL (CANCER CENTER ONLY)
Abs Immature Granulocytes: 0.03 10*3/uL (ref 0.00–0.07)
Basophils Absolute: 0 10*3/uL (ref 0.0–0.1)
Basophils Relative: 0 %
Eosinophils Absolute: 0.2 10*3/uL (ref 0.0–0.5)
Eosinophils Relative: 3 %
HCT: 36.2 % — ABNORMAL LOW (ref 39.0–52.0)
Hemoglobin: 11.4 g/dL — ABNORMAL LOW (ref 13.0–17.0)
Immature Granulocytes: 1 %
Lymphocytes Relative: 19 %
Lymphs Abs: 1.2 10*3/uL (ref 0.7–4.0)
MCH: 28.4 pg (ref 26.0–34.0)
MCHC: 31.5 g/dL (ref 30.0–36.0)
MCV: 90 fL (ref 80.0–100.0)
Monocytes Absolute: 0.6 10*3/uL (ref 0.1–1.0)
Monocytes Relative: 10 %
Neutro Abs: 4.2 10*3/uL (ref 1.7–7.7)
Neutrophils Relative %: 67 %
Platelet Count: 49 10*3/uL — ABNORMAL LOW (ref 150–400)
RBC: 4.02 MIL/uL — ABNORMAL LOW (ref 4.22–5.81)
RDW: 16.8 % — ABNORMAL HIGH (ref 11.5–15.5)
WBC Count: 6.3 10*3/uL (ref 4.0–10.5)
nRBC: 0 % (ref 0.0–0.2)

## 2021-07-30 LAB — CMP (CANCER CENTER ONLY)
ALT: 13 U/L (ref 0–44)
AST: 21 U/L (ref 15–41)
Albumin: 4.7 g/dL (ref 3.5–5.0)
Alkaline Phosphatase: 43 U/L (ref 38–126)
Anion gap: 9 (ref 5–15)
BUN: 14 mg/dL (ref 8–23)
CO2: 28 mmol/L (ref 22–32)
Calcium: 9.5 mg/dL (ref 8.9–10.3)
Chloride: 102 mmol/L (ref 98–111)
Creatinine: 1.12 mg/dL (ref 0.61–1.24)
GFR, Estimated: >60 mL/min (ref >60)
Glucose, Bld: 119 mg/dL — ABNORMAL HIGH (ref 70–99)
Potassium: 4.1 mmol/L (ref 3.5–5.1)
Sodium: 139 mmol/L (ref 135–145)
Total Bilirubin: 0.6 mg/dL (ref 0.3–1.2)
Total Protein: 7.2 g/dL (ref 6.5–8.1)

## 2021-07-30 LAB — FERRITIN: Ferritin: 101 ng/mL (ref 24–336)

## 2021-07-30 MED ORDER — ROMIPLOSTIM 125 MCG ~~LOC~~ SOLR
95.0000 ug | Freq: Once | SUBCUTANEOUS | Status: AC
  Administered 2021-07-30: 95 ug via SUBCUTANEOUS
  Filled 2021-07-30: qty 0.19

## 2021-07-30 NOTE — Progress Notes (Signed)
Hematology and Oncology Follow Up Visit  Lonnie Hansen [id number] 1950-06-29 71 y.o. 07/30/2021   Principle Diagnosis:  Chronic ITP   Current Therapy:        Nplate to keep platelet count over 100,000   Interim History:  Lonnie Hansen is here today for follow-up and Nplate injection. Platelets today are 49.  He has not noted any blood loss. He has had some bruising on his legs but not in excess. No petechiae.  He has some mild fatigue at times.  No fever, chills, n/v, cough, rash, dizziness, SOB, chest pain, palpitations, abdominal pain or changes in bowel or bladder habits.  No swelling, tenderness, numbness or tingling in his extremities at this time.  He ambulates with a cane for added support. No falls or syncope reported.  Appetite and hydration are good.   ECOG Performance Status: 1 - Symptomatic but completely ambulatory  Medications:  Allergies as of 07/30/2021   No Known Allergies      Medication List        Accurate as of July 30, 2021  2:13 PM. If you have any questions, ask your nurse or doctor.          acetaminophen 500 MG tablet Commonly known as: TYLENOL Take 1,000 mg by mouth daily as needed (pain).   amLODipine 10 MG tablet Commonly known as: NORVASC Take 10 mg by mouth daily.   Anucort-HC 25 MG suppository Generic drug: hydrocortisone Place 1 suppository rectally daily as needed for pain.   atenolol 25 MG tablet Commonly known as: TENORMIN Take 12.5 mg by mouth 2 (two) times daily.   ferrous sulfate 325 (65 FE) MG tablet Take 325 mg by mouth daily.   fluticasone 50 MCG/ACT nasal spray Commonly known as: FLONASE Place 2 sprays into both nostrils daily as needed for allergies or rhinitis.   latanoprost 0.005 % ophthalmic solution Commonly known as: XALATAN Place 1 drop into both eyes 2 (two) times daily.   multivitamin with minerals Tabs tablet Take 1 tablet by mouth daily.   pantoprazole 40 MG tablet Commonly known as: PROTONIX Take  40 mg by mouth 2 (two) times daily.   polyethylene glycol 17 g packet Commonly known as: MIRALAX / GLYCOLAX Take 17 g by mouth daily. What changed:  when to take this additional instructions   psyllium 95 % Pack Commonly known as: HYDROCIL/METAMUCIL Take 1 packet by mouth daily.   rosuvastatin 20 MG tablet Commonly known as: CRESTOR Take 20 mg by mouth daily.   triamcinolone cream 0.1 % Commonly known as: KENALOG Apply 1 application. topically 3 (three) times daily as needed (facial itching/rash).        Allergies: No Known Allergies  Past Medical History, Surgical history, Social history, and Family History were reviewed and updated.  Review of Systems: All other 10 point review of systems is negative.   Physical Exam:  oral temperature is 98.8 F (37.1 C). His blood pressure is 142/77 (abnormal) and his pulse is 55 (abnormal). His respiration is 17 and oxygen saturation is 100%.   Wt Readings from Last 3 Encounters:  06/30/21 201 lb 12.8 oz (91.5 kg)  06/18/21 200 lb (90.7 kg)  06/03/21 201 lb (91.2 kg)    Ocular: Sclerae unicteric, pupils equal, round and reactive to light Ear-nose-throat: Oropharynx clear, dentition fair Lymphatic: No cervical or supraclavicular adenopathy Lungs no rales or rhonchi, good excursion bilaterally Heart regular rate and rhythm, no murmur appreciated Abd soft, nontender, positive bowel sounds MSK  no focal spinal tenderness, no joint edema Neuro: non-focal, well-oriented, appropriate affect Breasts: Deferred   Lab Results  Component Value Date   WBC 5.8 07/17/2021   HGB 11.7 (L) 07/17/2021   HCT 36.9 (L) 07/17/2021   MCV 89.6 07/17/2021   PLT 117 (L) 07/17/2021   Lab Results  Component Value Date   FERRITIN 26 06/03/2021   IRON 61 06/03/2021   TIBC 405 06/03/2021   UIBC 344 06/03/2021   IRONPCTSAT 15 (L) 06/03/2021   Lab Results  Component Value Date   RBC 4.12 (L) 07/17/2021   No results found for: "KPAFRELGTCHN",  "LAMBDASER", "KAPLAMBRATIO" No results found for: "IGGSERUM", "IGA", "IGMSERUM" No results found for: "TOTALPROTELP", "ALBUMINELP", "A1GS", "A2GS", "BETS", "BETA2SER", "GAMS", "MSPIKE", "SPEI"   Chemistry      Component Value Date/Time   NA 138 07/17/2021 1328   NA 140 05/11/2019 0946   K 4.4 07/17/2021 1328   CL 102 07/17/2021 1328   CO2 29 07/17/2021 1328   BUN 16 07/17/2021 1328   BUN 17 05/11/2019 0946   CREATININE 1.16 07/17/2021 1328   CREATININE 0.75 04/10/2016 1132      Component Value Date/Time   CALCIUM 9.9 07/17/2021 1328   ALKPHOS 54 07/17/2021 1328   AST 31 07/17/2021 1328   ALT 31 07/17/2021 1328   BILITOT 0.6 07/17/2021 1328       Impression and Plan: Lonnie Hansen is a very pleasant 71 yo African American gentleman with chronic ITP.  Nplate given for platelets count of 49.  Iron studies pending.  Lab check and injection every 2 weeks, follow-up in 8 weeks.   Lonnie Dawson, NP 7/5/20232:13 PM

## 2021-07-30 NOTE — Patient Instructions (Signed)
[facility]  Discharge Instructions: Thank you for choosing [facility] to provide your oncology and hematology care.   If you have a lab appointment with the [facility], please go directly to the [facility] and check in at the registration area.  Wear comfortable clothing and clothing appropriate for easy access to any Portacath or PICC line.   We strive to give you quality time with your provider. You may need to reschedule your appointment if you arrive late (15 or more minutes).  Arriving late affects you and other patients whose appointments are after yours.  Also, if you miss three or more appointments without notifying the office, you may be dismissed from the clinic at the provider's discretion.      For prescription refill requests, have your pharmacy contact our office and allow 72 hours for refills to be completed.    Today you received the following chemotherapy and/or immunotherapy agents N-Plate.   To help prevent nausea and vomiting after your treatment, we encourage you to take your nausea medication as directed.  BELOW ARE SYMPTOMS THAT SHOULD BE REPORTED IMMEDIATELY: *FEVER GREATER THAN 100.4 F (38 C) OR HIGHER *CHILLS OR SWEATING *NAUSEA AND VOMITING THAT IS NOT CONTROLLED WITH YOUR NAUSEA MEDICATION *UNUSUAL SHORTNESS OF BREATH *UNUSUAL BRUISING OR BLEEDING *URINARY PROBLEMS (pain or burning when urinating, or frequent urination) *BOWEL PROBLEMS (unusual diarrhea, constipation, pain near the anus) TENDERNESS IN MOUTH AND THROAT WITH OR WITHOUT PRESENCE OF ULCERS (sore throat, sores in mouth, or a toothache) UNUSUAL RASH, SWELLING OR PAIN  UNUSUAL VAGINAL DISCHARGE OR ITCHING   Items with * indicate a potential emergency and should be followed up as soon as possible or go to the Emergency Department if any problems should occur.  Please show the CHEMOTHERAPY ALERT CARD or IMMUNOTHERAPY ALERT CARD at check-in to the  Emergency Department and triage nurse. Should you have questions after your visit or need to cancel or reschedule your appointment, please contact [facility]  [phone] and follow the prompts.  Office hours are 8:00 a.m. to 4:30 p.m. Monday - Friday. Please note that voicemails left after 4:00 p.m. may not be returned until the following business day.  We are closed weekends and major holidays. You have access to a nurse at all times for urgent questions. Please call the main number to the clinic [phone] and follow the prompts.  For any non-urgent questions, you may also contact your provider using MyChart. We now offer e-Visits for anyone 32 and older to request care online for non-urgent symptoms. For details visit [email or web address].   Also download the MyChart app! Go to the app store, search "MyChart", open the app, select [facility], and log in with your MyChart username and password.  Masks are optional in the cancer centers. If you would like for your care team to wear a mask while they are taking care of you, please let them know. For doctor visits, patients may have with them one support person who is at least 72 years old. At this time, visitors are not allowed in the infusion area.

## 2021-07-31 LAB — IRON AND IRON BINDING CAPACITY (CC-WL,HP ONLY)
Iron: 89 ug/dL (ref 45–182)
Saturation Ratios: 23 % (ref 17.9–39.5)
TIBC: 384 ug/dL (ref 250–450)
UIBC: 295 ug/dL (ref 117–376)

## 2021-08-04 DIAGNOSIS — I700 Atherosclerosis of aorta: Secondary | ICD-10-CM | POA: Diagnosis not present

## 2021-08-04 DIAGNOSIS — D693 Immune thrombocytopenic purpura: Secondary | ICD-10-CM | POA: Diagnosis not present

## 2021-08-04 DIAGNOSIS — I10 Essential (primary) hypertension: Secondary | ICD-10-CM | POA: Diagnosis not present

## 2021-08-04 DIAGNOSIS — G4733 Obstructive sleep apnea (adult) (pediatric): Secondary | ICD-10-CM | POA: Diagnosis not present

## 2021-08-04 DIAGNOSIS — E785 Hyperlipidemia, unspecified: Secondary | ICD-10-CM | POA: Diagnosis not present

## 2021-08-04 DIAGNOSIS — I2510 Atherosclerotic heart disease of native coronary artery without angina pectoris: Secondary | ICD-10-CM | POA: Diagnosis not present

## 2021-08-12 ENCOUNTER — Encounter: Payer: Self-pay | Admitting: Hematology & Oncology

## 2021-08-13 ENCOUNTER — Telehealth: Payer: Self-pay | Admitting: *Deleted

## 2021-08-13 ENCOUNTER — Inpatient Hospital Stay: Payer: Medicare Other

## 2021-08-13 VITALS — BP 120/88 | HR 68 | Temp 98.5°F | Resp 18

## 2021-08-13 DIAGNOSIS — Z79899 Other long term (current) drug therapy: Secondary | ICD-10-CM | POA: Diagnosis not present

## 2021-08-13 DIAGNOSIS — D693 Immune thrombocytopenic purpura: Secondary | ICD-10-CM | POA: Diagnosis not present

## 2021-08-13 DIAGNOSIS — D509 Iron deficiency anemia, unspecified: Secondary | ICD-10-CM

## 2021-08-13 DIAGNOSIS — R5383 Other fatigue: Secondary | ICD-10-CM | POA: Diagnosis not present

## 2021-08-13 LAB — CBC WITH DIFFERENTIAL (CANCER CENTER ONLY)
Abs Immature Granulocytes: 0.03 10*3/uL (ref 0.00–0.07)
Basophils Absolute: 0 10*3/uL (ref 0.0–0.1)
Basophils Relative: 0 %
Eosinophils Absolute: 0 10*3/uL (ref 0.0–0.5)
Eosinophils Relative: 1 %
HCT: 36.4 % — ABNORMAL LOW (ref 39.0–52.0)
Hemoglobin: 12.1 g/dL — ABNORMAL LOW (ref 13.0–17.0)
Immature Granulocytes: 1 %
Lymphocytes Relative: 8 %
Lymphs Abs: 0.5 10*3/uL — ABNORMAL LOW (ref 0.7–4.0)
MCH: 29.2 pg (ref 26.0–34.0)
MCHC: 33.2 g/dL (ref 30.0–36.0)
MCV: 87.7 fL (ref 80.0–100.0)
Monocytes Absolute: 0.9 10*3/uL (ref 0.1–1.0)
Monocytes Relative: 14 %
Neutro Abs: 4.9 10*3/uL (ref 1.7–7.7)
Neutrophils Relative %: 76 %
Platelet Count: 78 10*3/uL — ABNORMAL LOW (ref 150–400)
RBC: 4.15 MIL/uL — ABNORMAL LOW (ref 4.22–5.81)
RDW: 16.4 % — ABNORMAL HIGH (ref 11.5–15.5)
WBC Count: 6.3 10*3/uL (ref 4.0–10.5)
nRBC: 0 % (ref 0.0–0.2)

## 2021-08-13 LAB — CMP (CANCER CENTER ONLY)
ALT: 153 U/L — ABNORMAL HIGH (ref 0–44)
AST: 164 U/L — ABNORMAL HIGH (ref 15–41)
Albumin: 4.6 g/dL (ref 3.5–5.0)
Alkaline Phosphatase: 162 U/L — ABNORMAL HIGH (ref 38–126)
Anion gap: 12 (ref 5–15)
BUN: 26 mg/dL — ABNORMAL HIGH (ref 8–23)
CO2: 26 mmol/L (ref 22–32)
Calcium: 9.6 mg/dL (ref 8.9–10.3)
Chloride: 97 mmol/L — ABNORMAL LOW (ref 98–111)
Creatinine: 1.28 mg/dL — ABNORMAL HIGH (ref 0.61–1.24)
GFR, Estimated: 60 mL/min — ABNORMAL LOW (ref >60)
Glucose, Bld: 132 mg/dL — ABNORMAL HIGH (ref 70–99)
Potassium: 3.1 mmol/L — ABNORMAL LOW (ref 3.5–5.1)
Sodium: 135 mmol/L (ref 135–145)
Total Bilirubin: 3.9 mg/dL (ref 0.3–1.2)
Total Protein: 7.3 g/dL (ref 6.5–8.1)

## 2021-08-13 MED ORDER — ROMIPLOSTIM 125 MCG ~~LOC~~ SOLR
95.0000 ug | Freq: Once | SUBCUTANEOUS | Status: AC
  Administered 2021-08-13: 95 ug via SUBCUTANEOUS
  Filled 2021-08-13: qty 0.19

## 2021-08-13 NOTE — Telephone Encounter (Signed)
Received notification from lab that patient has a critical Bilirubin of 3.9.  Other LFTs were also elevated.  Patient has been seen at [facility].  Critical labs and LFTs called to Dwana Curd who will call patient to come in for evaluation.

## 2021-08-13 NOTE — Patient Instructions (Signed)
Romiplostim injection ?What is this medication? ?ROMIPLOSTIM (roe mi PLOE stim) helps your body make more platelets. This medicine is used to treat low platelets caused by chronic idiopathic thrombocytopenic purpura (ITP) or a bone marrow syndrome caused by radiation sickness. ?This medicine may be used for other purposes; ask your health care provider or pharmacist if you have questions. ?COMMON BRAND NAME(S): Nplate ?What should I tell my care team before I take this medication? ?They need to know if you have any of these conditions: ?blood clots ?myelodysplastic syndrome ?an unusual or allergic reaction to romiplostim, mannitol, other medicines, foods, dyes, or preservatives ?pregnant or trying to get pregnant ?breast-feeding ?How should I use this medication? ?This medicine is injected under the skin. It is given by a health care provider in a hospital or clinic setting. ?A special MedGuide will be given to you before each treatment. Be sure to read this information carefully each time. ?Talk to your health care provider about the use of this medicine in children. While it may be prescribed for children as young as newborns for selected conditions, precautions do apply. ?Overdosage: If you think you have taken too much of this medicine contact a poison control center or emergency room at once. ?NOTE: This medicine is only for you. Do not share this medicine with others. ?What if I miss a dose? ?Keep appointments for follow-up doses. It is important not to miss your dose. Call your health care provider if you are unable to keep an appointment. ?What may interact with this medication? ?Interactions are not expected. ?This list may not describe all possible interactions. Give your health care provider a list of all the medicines, herbs, non-prescription drugs, or dietary supplements you use. Also tell them if you smoke, drink alcohol, or use illegal drugs. Some items may interact with your medicine. ?What should I  watch for while using this medication? ?Visit your health care provider for regular checks on your progress. You may need blood work done while you are taking this medicine. Your condition will be monitored carefully while you are receiving this medicine. It is important not to miss any appointments. ?What side effects may I notice from receiving this medication? ?Side effects that you should report to your doctor or health care professional as soon as possible: ?allergic reactions (skin rash, itching or hives; swelling of the face, lips, or tongue) ?bleeding (bloody or black, tarry stools; red or dark brown urine; spitting up blood or brown material that looks like coffee grounds; red spots on the skin; unusual bruising or bleeding from the eyes, gums, or nose) ?blood clot (chest pain; shortness of breath; pain, swelling, or warmth in the leg) ?stroke (changes in vision; confusion; trouble speaking or understanding; severe headaches; sudden numbness or weakness of the face, arm or leg; trouble walking; dizziness; loss of balance or coordination) ?Side effects that usually do not require medical attention (report to your doctor or health care professional if they continue or are bothersome): ?diarrhea ?dizziness ?headache ?joint pain ?muscle pain ?stomach pain ?trouble sleeping ?This list may not describe all possible side effects. Call your doctor for medical advice about side effects. You may report side effects to FDA at 1-800-FDA-1088. ?Where should I keep my medication? ?This medicine is given in a hospital or clinic. It will not be stored at home. ?NOTE: This sheet is a summary. It may not cover all possible information. If you have questions about this medicine, talk to your doctor, pharmacist, or health care provider. ??   2023 Elsevier/Gold Standard (2020-12-13 00:00:00) ? ?

## 2021-08-14 ENCOUNTER — Encounter (HOSPITAL_COMMUNITY): Payer: Self-pay | Admitting: Emergency Medicine

## 2021-08-14 ENCOUNTER — Emergency Department (HOSPITAL_COMMUNITY)
Admission: EM | Admit: 2021-08-14 | Discharge: 2021-08-14 | Discharge status: Against Medical Advice | Payer: Medicare Other | Source: Home / Self Care

## 2021-08-14 DIAGNOSIS — R6883 Chills (without fever): Secondary | ICD-10-CM | POA: Insufficient documentation

## 2021-08-14 DIAGNOSIS — Z5321 Procedure and treatment not carried out due to patient leaving prior to being seen by health care provider: Secondary | ICD-10-CM | POA: Insufficient documentation

## 2021-08-14 DIAGNOSIS — I10 Essential (primary) hypertension: Secondary | ICD-10-CM | POA: Diagnosis not present

## 2021-08-14 DIAGNOSIS — H409 Unspecified glaucoma: Secondary | ICD-10-CM | POA: Diagnosis present

## 2021-08-14 DIAGNOSIS — M1611 Unilateral primary osteoarthritis, right hip: Secondary | ICD-10-CM | POA: Diagnosis not present

## 2021-08-14 DIAGNOSIS — E860 Dehydration: Secondary | ICD-10-CM | POA: Insufficient documentation

## 2021-08-14 DIAGNOSIS — N200 Calculus of kidney: Secondary | ICD-10-CM | POA: Diagnosis not present

## 2021-08-14 DIAGNOSIS — I2510 Atherosclerotic heart disease of native coronary artery without angina pectoris: Secondary | ICD-10-CM | POA: Diagnosis not present

## 2021-08-14 DIAGNOSIS — K449 Diaphragmatic hernia without obstruction or gangrene: Secondary | ICD-10-CM | POA: Diagnosis not present

## 2021-08-14 DIAGNOSIS — M1990 Unspecified osteoarthritis, unspecified site: Secondary | ICD-10-CM | POA: Diagnosis not present

## 2021-08-14 DIAGNOSIS — R338 Other retention of urine: Secondary | ICD-10-CM | POA: Diagnosis not present

## 2021-08-14 DIAGNOSIS — K838 Other specified diseases of biliary tract: Secondary | ICD-10-CM | POA: Diagnosis not present

## 2021-08-14 DIAGNOSIS — Y658 Other specified misadventures during surgical and medical care: Secondary | ICD-10-CM | POA: Diagnosis not present

## 2021-08-14 DIAGNOSIS — K920 Hematemesis: Secondary | ICD-10-CM | POA: Diagnosis not present

## 2021-08-14 DIAGNOSIS — E785 Hyperlipidemia, unspecified: Secondary | ICD-10-CM | POA: Diagnosis not present

## 2021-08-14 DIAGNOSIS — I959 Hypotension, unspecified: Secondary | ICD-10-CM | POA: Diagnosis not present

## 2021-08-14 DIAGNOSIS — J9811 Atelectasis: Secondary | ICD-10-CM | POA: Diagnosis not present

## 2021-08-14 DIAGNOSIS — D62 Acute posthemorrhagic anemia: Secondary | ICD-10-CM | POA: Diagnosis not present

## 2021-08-14 DIAGNOSIS — N281 Cyst of kidney, acquired: Secondary | ICD-10-CM | POA: Diagnosis not present

## 2021-08-14 DIAGNOSIS — N179 Acute kidney failure, unspecified: Secondary | ICD-10-CM | POA: Diagnosis not present

## 2021-08-14 DIAGNOSIS — K8033 Calculus of bile duct with acute cholangitis with obstruction: Secondary | ICD-10-CM | POA: Diagnosis not present

## 2021-08-14 DIAGNOSIS — G4733 Obstructive sleep apnea (adult) (pediatric): Secondary | ICD-10-CM | POA: Diagnosis not present

## 2021-08-14 DIAGNOSIS — R7989 Other specified abnormal findings of blood chemistry: Secondary | ICD-10-CM | POA: Diagnosis not present

## 2021-08-14 DIAGNOSIS — R7401 Elevation of levels of liver transaminase levels: Secondary | ICD-10-CM | POA: Insufficient documentation

## 2021-08-14 DIAGNOSIS — R578 Other shock: Secondary | ICD-10-CM | POA: Diagnosis not present

## 2021-08-14 DIAGNOSIS — R1011 Right upper quadrant pain: Secondary | ICD-10-CM | POA: Insufficient documentation

## 2021-08-14 DIAGNOSIS — K91840 Postprocedural hemorrhage and hematoma of a digestive system organ or structure following a digestive system procedure: Secondary | ICD-10-CM | POA: Diagnosis not present

## 2021-08-14 DIAGNOSIS — K5730 Diverticulosis of large intestine without perforation or abscess without bleeding: Secondary | ICD-10-CM | POA: Diagnosis not present

## 2021-08-14 DIAGNOSIS — Z9989 Dependence on other enabling machines and devices: Secondary | ICD-10-CM | POA: Diagnosis not present

## 2021-08-14 DIAGNOSIS — Y733 Surgical instruments, materials and gastroenterology and urology devices (including sutures) associated with adverse incidents: Secondary | ICD-10-CM | POA: Diagnosis not present

## 2021-08-14 DIAGNOSIS — K922 Gastrointestinal hemorrhage, unspecified: Secondary | ICD-10-CM | POA: Diagnosis not present

## 2021-08-14 DIAGNOSIS — Z953 Presence of xenogenic heart valve: Secondary | ICD-10-CM | POA: Diagnosis not present

## 2021-08-14 DIAGNOSIS — K8050 Calculus of bile duct without cholangitis or cholecystitis without obstruction: Secondary | ICD-10-CM | POA: Diagnosis not present

## 2021-08-14 DIAGNOSIS — R112 Nausea with vomiting, unspecified: Secondary | ICD-10-CM | POA: Insufficient documentation

## 2021-08-14 DIAGNOSIS — K921 Melena: Secondary | ICD-10-CM | POA: Diagnosis not present

## 2021-08-14 DIAGNOSIS — N401 Enlarged prostate with lower urinary tract symptoms: Secondary | ICD-10-CM | POA: Diagnosis present

## 2021-08-14 DIAGNOSIS — Z4682 Encounter for fitting and adjustment of non-vascular catheter: Secondary | ICD-10-CM | POA: Diagnosis not present

## 2021-08-14 DIAGNOSIS — I7143 Infrarenal abdominal aortic aneurysm, without rupture: Secondary | ICD-10-CM | POA: Diagnosis not present

## 2021-08-14 DIAGNOSIS — E874 Mixed disorder of acid-base balance: Secondary | ICD-10-CM | POA: Diagnosis not present

## 2021-08-14 DIAGNOSIS — E875 Hyperkalemia: Secondary | ICD-10-CM | POA: Diagnosis not present

## 2021-08-14 DIAGNOSIS — K5750 Diverticulosis of both small and large intestine without perforation or abscess without bleeding: Secondary | ICD-10-CM | POA: Diagnosis present

## 2021-08-14 DIAGNOSIS — Z87891 Personal history of nicotine dependence: Secondary | ICD-10-CM | POA: Diagnosis not present

## 2021-08-14 DIAGNOSIS — D693 Immune thrombocytopenic purpura: Secondary | ICD-10-CM | POA: Diagnosis not present

## 2021-08-14 DIAGNOSIS — R001 Bradycardia, unspecified: Secondary | ICD-10-CM | POA: Diagnosis not present

## 2021-08-14 DIAGNOSIS — K831 Obstruction of bile duct: Secondary | ICD-10-CM | POA: Diagnosis not present

## 2021-08-14 DIAGNOSIS — K8309 Other cholangitis: Secondary | ICD-10-CM | POA: Diagnosis not present

## 2021-08-14 DIAGNOSIS — G4730 Sleep apnea, unspecified: Secondary | ICD-10-CM | POA: Diagnosis not present

## 2021-08-14 DIAGNOSIS — M150 Primary generalized (osteo)arthritis: Secondary | ICD-10-CM | POA: Diagnosis not present

## 2021-08-14 DIAGNOSIS — K219 Gastro-esophageal reflux disease without esophagitis: Secondary | ICD-10-CM | POA: Diagnosis present

## 2021-08-14 DIAGNOSIS — I723 Aneurysm of iliac artery: Secondary | ICD-10-CM | POA: Diagnosis not present

## 2021-08-14 DIAGNOSIS — K2289 Other specified disease of esophagus: Secondary | ICD-10-CM | POA: Diagnosis not present

## 2021-08-14 DIAGNOSIS — Z0389 Encounter for observation for other suspected diseases and conditions ruled out: Secondary | ICD-10-CM | POA: Diagnosis not present

## 2021-08-14 DIAGNOSIS — R600 Localized edema: Secondary | ICD-10-CM | POA: Diagnosis not present

## 2021-08-14 DIAGNOSIS — K222 Esophageal obstruction: Secondary | ICD-10-CM | POA: Diagnosis not present

## 2021-08-14 DIAGNOSIS — R945 Abnormal results of liver function studies: Secondary | ICD-10-CM | POA: Diagnosis not present

## 2021-08-14 DIAGNOSIS — Z9049 Acquired absence of other specified parts of digestive tract: Secondary | ICD-10-CM | POA: Diagnosis not present

## 2021-08-14 DIAGNOSIS — I714 Abdominal aortic aneurysm, without rupture, unspecified: Secondary | ICD-10-CM | POA: Diagnosis not present

## 2021-08-14 DIAGNOSIS — I739 Peripheral vascular disease, unspecified: Secondary | ICD-10-CM | POA: Diagnosis present

## 2021-08-14 DIAGNOSIS — D649 Anemia, unspecified: Secondary | ICD-10-CM | POA: Diagnosis not present

## 2021-08-14 DIAGNOSIS — Z452 Encounter for adjustment and management of vascular access device: Secondary | ICD-10-CM | POA: Diagnosis not present

## 2021-08-14 LAB — CBC WITH DIFFERENTIAL/PLATELET
Abs Immature Granulocytes: 0.02 10*3/uL (ref 0.00–0.07)
Basophils Absolute: 0 10*3/uL (ref 0.0–0.1)
Basophils Relative: 0 %
Eosinophils Absolute: 0.1 10*3/uL (ref 0.0–0.5)
Eosinophils Relative: 1 %
HCT: 38.2 % — ABNORMAL LOW (ref 39.0–52.0)
Hemoglobin: 12.4 g/dL — ABNORMAL LOW (ref 13.0–17.0)
Immature Granulocytes: 0 %
Lymphocytes Relative: 12 %
Lymphs Abs: 0.7 10*3/uL (ref 0.7–4.0)
MCH: 29.1 pg (ref 26.0–34.0)
MCHC: 32.5 g/dL (ref 30.0–36.0)
MCV: 89.7 fL (ref 80.0–100.0)
Monocytes Absolute: 0.9 10*3/uL (ref 0.1–1.0)
Monocytes Relative: 17 %
Neutro Abs: 4 10*3/uL (ref 1.7–7.7)
Neutrophils Relative %: 70 %
Platelets: 90 10*3/uL — ABNORMAL LOW (ref 150–400)
RBC: 4.26 MIL/uL (ref 4.22–5.81)
RDW: 16.3 % — ABNORMAL HIGH (ref 11.5–15.5)
WBC: 5.7 10*3/uL (ref 4.0–10.5)
nRBC: 0 % (ref 0.0–0.2)

## 2021-08-14 LAB — COMPREHENSIVE METABOLIC PANEL
ALT: 225 U/L — ABNORMAL HIGH (ref 0–44)
AST: 322 U/L — ABNORMAL HIGH (ref 15–41)
Albumin: 4.3 g/dL (ref 3.5–5.0)
Alkaline Phosphatase: 170 U/L — ABNORMAL HIGH (ref 38–126)
Anion gap: 9 (ref 5–15)
BUN: 23 mg/dL (ref 8–23)
CO2: 28 mmol/L (ref 22–32)
Calcium: 9.7 mg/dL (ref 8.9–10.3)
Chloride: 100 mmol/L (ref 98–111)
Creatinine, Ser: 1.2 mg/dL (ref 0.61–1.24)
GFR, Estimated: >60 mL/min (ref >60)
Glucose, Bld: 135 mg/dL — ABNORMAL HIGH (ref 70–99)
Potassium: 3.6 mmol/L (ref 3.5–5.1)
Sodium: 137 mmol/L (ref 135–145)
Total Bilirubin: 2.5 mg/dL — ABNORMAL HIGH (ref 0.3–1.2)
Total Protein: 8 g/dL (ref 6.5–8.1)

## 2021-08-14 LAB — LIPASE, BLOOD: Lipase: 66 U/L — ABNORMAL HIGH (ref 11–51)

## 2021-08-14 NOTE — ED Provider Triage Note (Signed)
Emergency Medicine Provider Triage Evaluation Note  Lonnie Hansen , a 71 y.o. male  was evaluated in triage.  Pt complains of RUQ pain and elevated LFTs.  Hx of cholecystectomy.  Was sent by GI to get an MRI to assess for possible obstruction due to possible remnant calculi.  Endorses chills over the last 4 days, and nausea with NBNB vomiting or diarrhea.  Feels dehydrated.  Review of Systems  Positive:  Negative: As above  Physical Exam  BP (!) 141/95 (BP Location: Left Arm)   Pulse (!) 55   Temp 98.2 F (36.8 C) (Oral)   Resp 18   SpO2 98%  Gen:   Awake, no distress   Resp:  Normal effort  MSK:   Moves extremities without difficulty  Other:  Abdomen soft, with RUQ tenderness.  No flank pain.  Medical Decision Making  Medically screening exam initiated at 6:22 PM.  Appropriate orders placed.  [name] was informed that the remainder of the evaluation will be completed by another provider, this initial triage assessment does not replace that evaluation, and the importance of remaining in the ED until their evaluation is complete.     Prince Rome, PA-C 33/82/50 1824

## 2021-08-14 NOTE — ED Triage Notes (Signed)
Patient here from [facility] reporting right upper quad pain, nausea, cold sweats since Sat night. Elevated LFTs.

## 2021-08-15 ENCOUNTER — Other Ambulatory Visit: Payer: Self-pay | Admitting: Physician Assistant

## 2021-08-15 DIAGNOSIS — R7989 Other specified abnormal findings of blood chemistry: Secondary | ICD-10-CM

## 2021-08-16 ENCOUNTER — Emergency Department (HOSPITAL_BASED_OUTPATIENT_CLINIC_OR_DEPARTMENT_OTHER): Payer: Medicare Other

## 2021-08-16 ENCOUNTER — Encounter (HOSPITAL_BASED_OUTPATIENT_CLINIC_OR_DEPARTMENT_OTHER): Payer: Self-pay | Admitting: Emergency Medicine

## 2021-08-16 ENCOUNTER — Other Ambulatory Visit: Payer: Self-pay

## 2021-08-16 ENCOUNTER — Inpatient Hospital Stay (HOSPITAL_BASED_OUTPATIENT_CLINIC_OR_DEPARTMENT_OTHER)
Admission: EM | Admit: 2021-08-16 | Discharge: 2021-08-25 | DRG: 423 | Disposition: A | Discharge status: Home / Self Care | Payer: Medicare Other | Attending: Internal Medicine | Admitting: Internal Medicine

## 2021-08-16 DIAGNOSIS — G4733 Obstructive sleep apnea (adult) (pediatric): Secondary | ICD-10-CM

## 2021-08-16 DIAGNOSIS — Z8349 Family history of other endocrine, nutritional and metabolic diseases: Secondary | ICD-10-CM

## 2021-08-16 DIAGNOSIS — N179 Acute kidney failure, unspecified: Secondary | ICD-10-CM | POA: Diagnosis not present

## 2021-08-16 DIAGNOSIS — D62 Acute posthemorrhagic anemia: Secondary | ICD-10-CM | POA: Diagnosis not present

## 2021-08-16 DIAGNOSIS — M1990 Unspecified osteoarthritis, unspecified site: Secondary | ICD-10-CM | POA: Diagnosis not present

## 2021-08-16 DIAGNOSIS — N200 Calculus of kidney: Secondary | ICD-10-CM | POA: Diagnosis not present

## 2021-08-16 DIAGNOSIS — Z953 Presence of xenogenic heart valve: Secondary | ICD-10-CM | POA: Diagnosis not present

## 2021-08-16 DIAGNOSIS — K91840 Postprocedural hemorrhage and hematoma of a digestive system organ or structure following a digestive system procedure: Secondary | ICD-10-CM | POA: Diagnosis not present

## 2021-08-16 DIAGNOSIS — R578 Other shock: Secondary | ICD-10-CM | POA: Diagnosis not present

## 2021-08-16 DIAGNOSIS — Z8679 Personal history of other diseases of the circulatory system: Secondary | ICD-10-CM

## 2021-08-16 DIAGNOSIS — E874 Mixed disorder of acid-base balance: Secondary | ICD-10-CM | POA: Diagnosis not present

## 2021-08-16 DIAGNOSIS — K8309 Other cholangitis: Secondary | ICD-10-CM | POA: Diagnosis present

## 2021-08-16 DIAGNOSIS — Y733 Surgical instruments, materials and gastroenterology and urology devices (including sutures) associated with adverse incidents: Secondary | ICD-10-CM | POA: Diagnosis not present

## 2021-08-16 DIAGNOSIS — R338 Other retention of urine: Secondary | ICD-10-CM | POA: Diagnosis not present

## 2021-08-16 DIAGNOSIS — I739 Peripheral vascular disease, unspecified: Secondary | ICD-10-CM | POA: Diagnosis not present

## 2021-08-16 DIAGNOSIS — R7401 Elevation of levels of liver transaminase levels: Secondary | ICD-10-CM

## 2021-08-16 DIAGNOSIS — K5730 Diverticulosis of large intestine without perforation or abscess without bleeding: Secondary | ICD-10-CM | POA: Diagnosis not present

## 2021-08-16 DIAGNOSIS — K219 Gastro-esophageal reflux disease without esophagitis: Secondary | ICD-10-CM | POA: Diagnosis present

## 2021-08-16 DIAGNOSIS — I2510 Atherosclerotic heart disease of native coronary artery without angina pectoris: Secondary | ICD-10-CM | POA: Diagnosis not present

## 2021-08-16 DIAGNOSIS — K5750 Diverticulosis of both small and large intestine without perforation or abscess without bleeding: Secondary | ICD-10-CM | POA: Diagnosis not present

## 2021-08-16 DIAGNOSIS — K8033 Calculus of bile duct with acute cholangitis with obstruction: Principal | ICD-10-CM | POA: Diagnosis present

## 2021-08-16 DIAGNOSIS — R1011 Right upper quadrant pain: Secondary | ICD-10-CM | POA: Diagnosis not present

## 2021-08-16 DIAGNOSIS — Z79899 Other long term (current) drug therapy: Secondary | ICD-10-CM

## 2021-08-16 DIAGNOSIS — H409 Unspecified glaucoma: Secondary | ICD-10-CM | POA: Diagnosis present

## 2021-08-16 DIAGNOSIS — I723 Aneurysm of iliac artery: Secondary | ICD-10-CM | POA: Diagnosis present

## 2021-08-16 DIAGNOSIS — I10 Essential (primary) hypertension: Secondary | ICD-10-CM | POA: Diagnosis not present

## 2021-08-16 DIAGNOSIS — N281 Cyst of kidney, acquired: Secondary | ICD-10-CM | POA: Diagnosis not present

## 2021-08-16 DIAGNOSIS — E875 Hyperkalemia: Secondary | ICD-10-CM | POA: Diagnosis not present

## 2021-08-16 DIAGNOSIS — K449 Diaphragmatic hernia without obstruction or gangrene: Secondary | ICD-10-CM | POA: Diagnosis not present

## 2021-08-16 DIAGNOSIS — K8050 Calculus of bile duct without cholangitis or cholecystitis without obstruction: Secondary | ICD-10-CM | POA: Diagnosis present

## 2021-08-16 DIAGNOSIS — Z9049 Acquired absence of other specified parts of digestive tract: Secondary | ICD-10-CM | POA: Diagnosis not present

## 2021-08-16 DIAGNOSIS — R7989 Other specified abnormal findings of blood chemistry: Secondary | ICD-10-CM

## 2021-08-16 DIAGNOSIS — R001 Bradycardia, unspecified: Secondary | ICD-10-CM | POA: Diagnosis not present

## 2021-08-16 DIAGNOSIS — Y658 Other specified misadventures during surgical and medical care: Secondary | ICD-10-CM | POA: Diagnosis not present

## 2021-08-16 DIAGNOSIS — R3129 Other microscopic hematuria: Secondary | ICD-10-CM | POA: Diagnosis not present

## 2021-08-16 DIAGNOSIS — Z8674 Personal history of sudden cardiac arrest: Secondary | ICD-10-CM

## 2021-08-16 DIAGNOSIS — I7143 Infrarenal abdominal aortic aneurysm, without rupture: Secondary | ICD-10-CM | POA: Diagnosis not present

## 2021-08-16 DIAGNOSIS — I493 Ventricular premature depolarization: Secondary | ICD-10-CM | POA: Diagnosis not present

## 2021-08-16 DIAGNOSIS — D693 Immune thrombocytopenic purpura: Secondary | ICD-10-CM | POA: Diagnosis not present

## 2021-08-16 DIAGNOSIS — Z87891 Personal history of nicotine dependence: Secondary | ICD-10-CM

## 2021-08-16 DIAGNOSIS — Z8249 Family history of ischemic heart disease and other diseases of the circulatory system: Secondary | ICD-10-CM

## 2021-08-16 DIAGNOSIS — N401 Enlarged prostate with lower urinary tract symptoms: Secondary | ICD-10-CM | POA: Diagnosis present

## 2021-08-16 DIAGNOSIS — E785 Hyperlipidemia, unspecified: Secondary | ICD-10-CM | POA: Diagnosis not present

## 2021-08-16 LAB — CBC WITH DIFFERENTIAL/PLATELET
Abs Immature Granulocytes: 0.03 10*3/uL (ref 0.00–0.07)
Basophils Absolute: 0 10*3/uL (ref 0.0–0.1)
Basophils Relative: 0 %
Eosinophils Absolute: 0 10*3/uL (ref 0.0–0.5)
Eosinophils Relative: 1 %
HCT: 40.5 % (ref 39.0–52.0)
Hemoglobin: 13.3 g/dL (ref 13.0–17.0)
Immature Granulocytes: 0 %
Lymphocytes Relative: 13 %
Lymphs Abs: 0.9 10*3/uL (ref 0.7–4.0)
MCH: 28.7 pg (ref 26.0–34.0)
MCHC: 32.8 g/dL (ref 30.0–36.0)
MCV: 87.3 fL (ref 80.0–100.0)
Monocytes Absolute: 1 10*3/uL (ref 0.1–1.0)
Monocytes Relative: 13 %
Neutro Abs: 5.3 10*3/uL (ref 1.7–7.7)
Neutrophils Relative %: 73 %
Platelets: 100 10*3/uL — ABNORMAL LOW (ref 150–400)
RBC: 4.64 MIL/uL (ref 4.22–5.81)
RDW: 16.3 % — ABNORMAL HIGH (ref 11.5–15.5)
WBC: 7.2 10*3/uL (ref 4.0–10.5)
nRBC: 0 % (ref 0.0–0.2)

## 2021-08-16 LAB — LACTIC ACID, PLASMA: Lactic Acid, Venous: 1.3 mmol/L (ref 0.5–1.9)

## 2021-08-16 LAB — COMPREHENSIVE METABOLIC PANEL
ALT: 370 U/L — ABNORMAL HIGH (ref 0–44)
AST: 397 U/L — ABNORMAL HIGH (ref 15–41)
Albumin: 4.8 g/dL (ref 3.5–5.0)
Alkaline Phosphatase: 250 U/L — ABNORMAL HIGH (ref 38–126)
Anion gap: 16 — ABNORMAL HIGH (ref 5–15)
BUN: 16 mg/dL (ref 8–23)
CO2: 24 mmol/L (ref 22–32)
Calcium: 10.1 mg/dL (ref 8.9–10.3)
Chloride: 98 mmol/L (ref 98–111)
Creatinine, Ser: 1.08 mg/dL (ref 0.61–1.24)
GFR, Estimated: >60 mL/min (ref >60)
Glucose, Bld: 129 mg/dL — ABNORMAL HIGH (ref 70–99)
Potassium: 3 mmol/L — ABNORMAL LOW (ref 3.5–5.1)
Sodium: 138 mmol/L (ref 135–145)
Total Bilirubin: 3 mg/dL — ABNORMAL HIGH (ref 0.3–1.2)
Total Protein: 7.9 g/dL (ref 6.5–8.1)

## 2021-08-16 LAB — URINALYSIS, ROUTINE W REFLEX MICROSCOPIC
Bilirubin Urine: NEGATIVE
Glucose, UA: NEGATIVE mg/dL
Ketones, ur: 15 mg/dL — AB
Leukocytes,Ua: NEGATIVE
Nitrite: NEGATIVE
Specific Gravity, Urine: 1.032 — ABNORMAL HIGH (ref 1.005–1.030)
pH: 5.5 (ref 5.0–8.0)

## 2021-08-16 LAB — HEPATITIS PANEL, ACUTE
HCV Ab: NONREACTIVE
Hep A IgM: NONREACTIVE
Hep B C IgM: NONREACTIVE
Hepatitis B Surface Ag: NONREACTIVE

## 2021-08-16 LAB — LIPASE, BLOOD: Lipase: 68 U/L — ABNORMAL HIGH (ref 11–51)

## 2021-08-16 MED ORDER — MORPHINE SULFATE (PF) 2 MG/ML IV SOLN
1.0000 mg | INTRAVENOUS | Status: DC | PRN
  Administered 2021-08-20: 1 mg via INTRAVENOUS
  Filled 2021-08-16: qty 1

## 2021-08-16 MED ORDER — POTASSIUM CHLORIDE 10 MEQ/100ML IV SOLN
10.0000 meq | INTRAVENOUS | Status: AC
  Administered 2021-08-17 (×4): 10 meq via INTRAVENOUS
  Filled 2021-08-16 (×4): qty 100

## 2021-08-16 MED ORDER — IOHEXOL 300 MG/ML  SOLN
80.0000 mL | Freq: Once | INTRAMUSCULAR | Status: AC | PRN
  Administered 2021-08-16: 80 mL via INTRAVENOUS

## 2021-08-16 MED ORDER — ONDANSETRON HCL 4 MG/2ML IJ SOLN
4.0000 mg | Freq: Once | INTRAMUSCULAR | Status: AC
  Administered 2021-08-16: 4 mg via INTRAVENOUS
  Filled 2021-08-16: qty 2

## 2021-08-16 MED ORDER — SODIUM CHLORIDE 0.9 % IV BOLUS
500.0000 mL | Freq: Once | INTRAVENOUS | Status: AC
  Administered 2021-08-16: 500 mL via INTRAVENOUS

## 2021-08-16 MED ORDER — FENTANYL CITRATE PF 50 MCG/ML IJ SOSY
50.0000 ug | PREFILLED_SYRINGE | Freq: Once | INTRAMUSCULAR | Status: DC
  Filled 2021-08-16: qty 1

## 2021-08-16 MED ORDER — PANTOPRAZOLE SODIUM 40 MG IV SOLR
40.0000 mg | Freq: Once | INTRAVENOUS | Status: AC
  Administered 2021-08-16: 40 mg via INTRAVENOUS
  Filled 2021-08-16: qty 10

## 2021-08-16 NOTE — ED Provider Notes (Signed)
[facility] EMERGENCY DEPT Provider Note   CSN: [id number] Arrival date & time: 08/16/21  1815     History  Chief Complaint  Patient presents with   Abdominal Pain    BRENNER VISCONTI is a 71 y.o. male.  Patient is a 71 year old male who presents with abdominal pain.  He is status postcholecystectomy in 2022.  He has had about a 3 to 4-day history of pain in his right upper quadrant.  This is associated with nausea, vomiting and soft stools.  No known fevers although he does sweat at times when the pain gets intense.  He has recently been seen by his gastroenterologist.  He has had a recent bump over the last few days and his LFTs.  There was some concern given his abdominal pain associated with his increased LFTs that he may have a retained stone in his gallbladder duct.  He has been scheduled for an MRCP tomorrow.  His pain got worse tonight and he presented to the emergency room.  He also was recently been treated last Tuesday for UTI and completed a 3-day course of Cipro.  He does not use alcohol.  On chart review, he does have a history of AAA, aortic valve replacement, obstructive sleep apnea, hypertension, hyperlipidemia, chronic ITP and had a recent admission in May for diverticular bleed which required 3 units of blood.  He had a coil embolectomy of the site of bleeding at that time.       Home Medications Prior to Admission medications   Medication Sig Start Date End Date Taking? Authorizing Provider  acetaminophen (TYLENOL) 500 MG tablet Take 1,000 mg by mouth daily as needed (pain).    [provider]  amLODipine (NORVASC) 10 MG tablet Take 10 mg by mouth daily. 06/24/21   [provider]  ANUCORT-HC 25 MG suppository Place 1 suppository rectally daily as needed for pain. 04/29/21   [provider]  atenolol (TENORMIN) 25 MG tablet Take 12.5 mg by mouth 2 (two) times daily. 08/26/20   [name], MD  ferrous sulfate 325 (65 FE) MG  tablet Take 325 mg by mouth daily. Patient not taking: Reported on 07/30/2021    [provider]  fluticasone (FLONASE) 50 MCG/ACT nasal spray Place 2 sprays into both nostrils daily as needed for allergies or rhinitis.    [provider]  latanoprost (XALATAN) 0.005 % ophthalmic solution Place 1 drop into both eyes 2 (two) times daily.    [provider]  Multiple Vitamin (MULTIVITAMIN WITH MINERALS) TABS tablet Take 1 tablet by mouth daily.    [provider]  pantoprazole (PROTONIX) 40 MG tablet Take 40 mg by mouth 2 (two) times daily. 11/09/20   [provider]  polyethylene glycol (MIRALAX / GLYCOLAX) 17 g packet Take 17 g by mouth daily. Patient taking differently: Take 17 g by mouth See admin instructions. Every other day 06/04/20   [name], MD  psyllium (HYDROCIL/METAMUCIL) 95 % PACK Take 1 packet by mouth daily. 06/04/20   [name], MD  rosuvastatin (CRESTOR) 20 MG tablet Take 20 mg by mouth daily.    [provider]  triamcinolone cream (KENALOG) 0.1 % Apply 1 application. topically 3 (three) times daily as needed (facial itching/rash). 05/16/20   [provider]      Allergies    Patient has no known allergies.    Review of Systems   Review of Systems  Constitutional:  Negative for chills, diaphoresis, fatigue and  fever.  HENT:  Negative for congestion, rhinorrhea and sneezing.   Eyes: Negative.   Respiratory:  Negative for cough, chest tightness and shortness of breath.   Cardiovascular:  Negative for chest pain and leg swelling.  Gastrointestinal:  Positive for abdominal pain, nausea and vomiting. Negative for blood in stool and diarrhea.  Genitourinary:  Negative for difficulty urinating, flank pain, frequency and hematuria.  Musculoskeletal:  Negative for arthralgias and back pain.  Skin:  Negative for rash.  Neurological:  Negative for dizziness, speech difficulty, weakness, numbness and headaches.     Physical Exam Updated Vital Signs BP (!) 164/95   Pulse (!) 57   Temp 98.6 F (37 C)   Resp (!) 22   Ht '5\' 10"'$  (1.778 m)   Wt 91.2 kg   SpO2 96%   BMI 28.84 kg/m  Physical Exam Constitutional:      Appearance: He is well-developed.  HENT:     Head: Normocephalic and atraumatic.  Eyes:     Pupils: Pupils are equal, round, and reactive to light.  Cardiovascular:     Rate and Rhythm: Normal rate and regular rhythm.     Heart sounds: Normal heart sounds.  Pulmonary:     Effort: Pulmonary effort is normal. No respiratory distress.     Breath sounds: Normal breath sounds. No wheezing or rales.  Chest:     Chest wall: No tenderness.  Abdominal:     General: Bowel sounds are normal.     Palpations: Abdomen is soft.     Tenderness: There is abdominal tenderness in the right upper quadrant and right lower quadrant. There is no guarding or rebound.  Musculoskeletal:        General: Normal range of motion.     Cervical back: Normal range of motion and neck supple.  Lymphadenopathy:     Cervical: No cervical adenopathy.  Skin:    General: Skin is warm and dry.     Findings: No rash.  Neurological:     Mental Status: He is alert and oriented to person, place, and time.     ED Results / Procedures / Treatments   Labs (all labs ordered are listed, but only abnormal results are displayed) Labs Reviewed  COMPREHENSIVE METABOLIC PANEL - Abnormal; Notable for the following components:      Result Value   Potassium 3.0 (*)    Glucose, Bld 129 (*)    AST 397 (*)    ALT 370 (*)    Alkaline Phosphatase 250 (*)    Total Bilirubin 3.0 (*)    Anion gap 16 (*)    All other components within normal limits  LIPASE, BLOOD - Abnormal; Notable for the following components:   Lipase 68 (*)    All other components within normal limits  CBC WITH DIFFERENTIAL/PLATELET - Abnormal; Notable for the following components:   RDW 16.3 (*)    Platelets 100 (*)    All other components within  normal limits  URINALYSIS, ROUTINE W REFLEX MICROSCOPIC - Abnormal; Notable for the following components:   Specific Gravity, Urine 1.032 (*)    Hgb urine dipstick LARGE (*)    Ketones, ur 15 (*)    Protein, ur TRACE (*)    All other components within normal limits  LACTIC ACID, PLASMA  HEPATITIS PANEL, ACUTE    EKG None  Radiology CT Abdomen Pelvis W Contrast  Result Date: 08/16/2021 CLINICAL DATA:  Abdominal pain, acute, nonlocalized ruq abd pain. right upper quadrant  pain , pt has no gall bladder, has a mrcp tomorrow at [facility]. EXAM: CT ABDOMEN AND PELVIS WITH CONTRAST TECHNIQUE: Multidetector CT imaging of the abdomen and pelvis was performed using the standard protocol following bolus administration of intravenous contrast. RADIATION DOSE REDUCTION: This exam was performed according to the departmental dose-optimization program which includes automated exposure control, adjustment of the mA and/or kV according to patient size and/or use of iterative reconstruction technique. CONTRAST:  84m OMNIPAQUE IOHEXOL 300 MG/ML  SOLN COMPARISON:  CT abdomen pelvis angiography 05/11/2021 FINDINGS: Lower chest: Elevated left hemidiaphragm with passive atelectasis of the left lower lobe. Otherwise no acute abnormality. Partially visualized aortic valve replacement. Possible tiny hiatal hernia. Hepatobiliary: Several scattered subcentimeter hypodensities too small to characterize. Otherwise no focal liver abnormality. Status post cholecystectomy. No biliary dilatation. Query slightly enhancement/thickening of the common bile duct wall. Pancreas: No focal lesion. Normal pancreatic contour. No surrounding inflammatory changes. No main pancreatic ductal dilatation. Spleen: Normal in size without focal abnormality. Adrenals/Urinary Tract: No adrenal nodule bilaterally. Bilateral kidneys enhance symmetrically. Simple free fluid parapelvic cyst on the right. Other simple fluid densities within the kidneys  likely represent simple renal cysts. Simple renal cysts, in the absence of clinically indicated signs/symptoms, require no independent follow-up. Redemonstration of a 1.3 cm left renal calcification. No hydronephrosis. No hydroureter. The urinary bladder is unremarkable. Stomach/Bowel: Stomach is within normal limits. No evidence of bowel wall thickening or dilatation. Diffuse colonic diverticulosis. Appendix appears normal. Vascular/Lymphatic: Interval increase of an infrarenal abdominal aorta aneurysm measuring up to 5.3 x 4.8 cm (from 4.8 x 4.4 cm) with finding extending for approximately 6 cm in the craniocaudal dimension and extending to the aortic bifurcation. 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 Mild atherosclerotic plaque of the aorta and its branches. No abdominal, pelvic, or inguinal lymphadenopathy. Reproductive: Prostate is unremarkable. Other: No intraperitoneal free fluid. No intraperitoneal free gas. No organized fluid collection. Musculoskeletal: No abdominal wall hernia or abnormality. No suspicious lytic or blastic osseous lesions. No acute displaced fracture. Multilevel degenerative changes of the spine. 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 IMPRESSION: 1. Interval increase of an infrarenal abdominal aorta aneurysm measuring up to 5.3 x 4.8 cm (from 4.8 x 4.4 cm). Recommend referral to a vascular specialist. This recommendation follows ACR consensus guidelines: White Paper of the ACR Incidental Findings Committee II on Vascular Findings. J Am Coll Radiol 2013; 10:789-794. 2. 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 3. 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 4. Status post cholecystectomy. Query slightly enhancement/thickening of the common bile duct  wall. 5. Possible tiny hiatal hernia. 6. Diffuse colonic diverticulosis with no acute diverticulitis. 7. Nonobstructive left nephrolithiasis measuring up to 1.3 cm. 8. 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 Electronically Signed   By: MIven FinnM.D.   On: 08/16/2021 20:41    Procedures Procedures    Medications Ordered in ED Medications  fentaNYL (SUBLIMAZE) injection 50 mcg (0 mcg Intravenous Hold 08/16/21 2017)  ondansetron (ZOFRAN) injection 4 mg (4 mg Intravenous Given 08/16/21 2000)  sodium chloride 0.9 % bolus 500 mL (500 mLs Intravenous New Bag/Given 08/16/21 2008)  pantoprazole (PROTONIX) injection 40 mg (40 mg Intravenous Given 08/16/21 2000)  iohexol (OMNIPAQUE) 300 MG/ML solution  80 mL (80 mLs Intravenous Contrast Given 08/16/21 2015)    ED Course/ Medical Decision Making/ A&P                           Medical Decision Making Amount and/or Complexity of Data Reviewed Labs: ordered. Radiology: ordered.  Risk Prescription drug management. Decision regarding hospitalization.   Patient is a 71 year old male who is status postcholecystectomy in 2022.  He presents with worsening pain in his right upper abdomen associated with nausea and vomiting.  He has had some progressive elevation in his LFTs.  He is afebrile.  His blood pressure is stable.  His LFTs are compared to prior recent LFTs in his chart.  They seem to be progressively rising.  His bilirubin is similar to prior values.  Hepatitis panel was ordered.  CT scan was ordered which showed questionable inflammatory changes in the common bile duct otherwise unremarkable.  He does have a known history of a AAA and it seems to have enlarged on the CT scan as compared to priors.  We are unable to do MRCP at this facility.  I did send a message to the [facility] on-call for GI, Dr. Alessandra Bevels who will add the patient to the consult list for tomorrow.  I spoke with Dr. Myna Hidalgo he will admit  the patient for further treatment.  Patient will need an MRCP and GI consult tomorrow.  Final Clinical Impression(s) / ED Diagnoses Final diagnoses:  Right upper quadrant abdominal pain  Transaminitis    Rx / DC Orders ED Discharge Orders     None         Malvin Johns, MD 08/16/21 2206

## 2021-08-16 NOTE — H&P (Signed)
History and Physical    Lonnie Hansen:[id number] DOB: April 30, 1950 DOA: 08/16/2021  PCP: [name], MD  Patient coming from: [place] ED  I have personally briefly reviewed patient's old medical records in [place]  Chief Complaint: Right upper quadrant pain  HPI: Lonnie Hansen is a 71 y.o. 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  ***  [facility] ED Course  Labs/Imaging on admission: I have personally reviewed following labs and imaging studies.  Initial vitals showed BP 154/83, pulse 49, RR 16, temp 98.6 F, SPO2 100% on room air.  Labs show AST 397, ALT 370, alk phos 250, total bilirubin 3.0, sodium 138, potassium 3.0, bicarb 24, BUN 16, creatinine 1.08, serum glucose 129, lipase 68, WBC 7.2, hemoglobin 13.3, platelets 100,000, lactic acid 1.3.  Acute hepatitis panel is negative.  Urinalysis negative for UTI.  CT abdomen/pelvis with contrast shows interval increase of the infrarenal abdominal aorta aneurysm measuring up to 5.3 x 4.8 cm.  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  Prior cholecystectomy with enhancement/thickening of the CBD wall reported.  Diffuse colonic diverticulosis without acute diverticulitis, nonobstructive left nephrolithiasis, severe degenerative changes of the right hip with acetabulofemoral deformity noted.  Patient was given 500 cc normal saline, IV Protonix 40 mg, IV Zofran.  [facility], Dr. Alessandra Bevels, was notified.  Hospitalist service was consulted to admit for further evaluation and management.  Review of Systems: All systems  reviewed and are negative except as documented in history of present illness above.   Past Medical History:  Diagnosis Date   AAA (abdominal aortic aneurysm) (HCC)    Anemia    Arthritis    Asymptomatic bilateral carotid artery stenosis 08/2015   1-39%    Cataracts, bilateral    Chronic ITP (idiopathic thrombocytopenia) ([place]) 03/31/2018   Coronary artery disease    Diverticulosis    Enlarged aorta (HCC)    Enlarged prostate    slightly   GERD (gastroesophageal reflux disease)    takes Pantoprazole daily as needed   Glaucoma    uses eye drops daily   Headache    History of colon polyps    benign   History of kidney stones    Hyperlipidemia    no on any meds   Hypertension    takes Amlodipine and Atenolol daily   OSA on CPAP    Vocal cord nodule    pt. states  it's a" growth on vocal cord"    Past Surgical History:  Procedure Laterality Date   AORTIC ARCH ANGIOGRAPHY N/A 04/16/2016   Procedure: Aortic Arch Angiography;  Surgeon: [name] Martinique, MD;  Location: [place] [facility];  Service: Cardiovascular;  Laterality: N/A;   AORTIC VALVE REPLACEMENT N/A 09/15/2016   Procedure: AORTIC VALVE REPLACEMENT (AVR);  Surgeon: Grace Isaac, MD;  Location: [place];  Service: Open Heart Surgery;  Laterality: N/A;  Using 64m Edwards Perimount Magna Ease Aortic Bioprosthesis Valve   ASCENDING AORTIC ROOT REPLACEMENT N/A 09/15/2016   Procedure: ASCENDING AORTIC ROOT REPLACEMENT;  Surgeon: GGrace Isaac MD;  [place]  Service: Open Heart  Surgery;  Laterality: N/A;  Using 45m Gelweave Valsalva Graft   CHOLECYSTECTOMY N/A 12/19/2020   Procedure: LAPAROSCOPIC CHOLECYSTECTOMY WITH POSSIBLE INTRAOPERATIVE CHOLANGIOGRAM;  Surgeon: WGreer Pickerel MD;  [place]  Service: General;  Laterality: N/A;   COLONOSCOPY     COLONOSCOPY Left 04/16/2019   Procedure: COLONOSCOPY;  Surgeon: OArta Silence MD;  [facility];  Service: Endoscopy;  Laterality: Left;    COLONOSCOPY N/A 06/01/2020   Procedure: COLONOSCOPY;  Surgeon: KRonnette Juniper MD;  [place]  Service: Gastroenterology;  Laterality: N/A;   COLONOSCOPY WITH ESOPHAGOGASTRODUODENOSCOPY (EGD)     COLONOSCOPY WITH PROPOFOL N/A 05/14/2021   Procedure: COLONOSCOPY WITH PROPOFOL;  Surgeon: KRonnette Juniper MD;  [facility];  Service: Gastroenterology;  Laterality: N/A;   ESOPHAGOGASTRODUODENOSCOPY N/A 05/25/2020   Procedure: ESOPHAGOGASTRODUODENOSCOPY (EGD);  Surgeon: SWilford Corner MD;  [place]  Service: Endoscopy;  Laterality: N/A;   ESOPHAGOGASTRODUODENOSCOPY (EGD) WITH PROPOFOL Left 04/16/2019   Procedure: ESOPHAGOGASTRODUODENOSCOPY (EGD) WITH PROPOFOL;  Surgeon: OArta Silence MD;  [facility];  Service: Endoscopy;  Laterality: Left;   FLEXIBLE SIGMOIDOSCOPY N/A 05/25/2020   Procedure: FLEXIBLE SIGMOIDOSCOPY;  Surgeon: SWilford Corner MD;  [place]  Service: Endoscopy;  Laterality: N/A;   IR ANGIOGRAM SELECTIVE EACH ADDITIONAL VESSEL  05/26/2021   IR ANGIOGRAM VISCERAL SELECTIVE  05/26/2021   IR EMBO ART  VEN HEMORR LYMPH EXTRAV  INC GUIDE ROADMAPPING  05/26/2021   IR THORACENTESIS ASP PLEURAL SPACE W/IMG GUIDE  10/23/2016   IR [place] ACCESS LEFT  05/26/2021   IR [place] V[place] ACCESS RIGHT  05/26/2021   MICROLARYNGOSCOPY Right 09/20/2017   Procedure: MICROLARYNGOSCOPY WITH  EXCISION OF VOCAL CORD LESION;  Surgeon: TLeta Baptist MD;  [place]  Service: ENT;  Laterality: Right;   MULTIPLE EXTRACTIONS WITH ALVEOLOPLASTY N/A 06/10/2016   Procedure: Extraction of tooth #'s 2,8,13,15, and 29  with alveoloplasty, maxillary right and left buccal exostoses reductions, and gross debridement of remaining teeth.;  Surgeon: KLenn Cal DDS;  [place]  Service: Oral Surgery;  Laterality: N/A;   POLYPECTOMY  05/14/2021   Procedure: POLYPECTOMY;  Surgeon: KRonnette Juniper MD;  [facility];  Service:  Gastroenterology;;   RIGHT/LEFT HEART CATH AND CORONARY ANGIOGRAPHY N/A 04/16/2016   Procedure: Right/Left Heart Cath and Coronary Angiography;  Surgeon: [name] JMartinique MD;  [facility];  Service: Cardiovascular;  Laterality: N/A;   TEE WITHOUT CARDIOVERSION N/A 09/15/2016   Procedure: TRANSESOPHAGEAL ECHOCARDIOGRAM (TEE);  Surgeon: GGrace Isaac MD;  [place]  Service: Open Heart Surgery;  Laterality: N/A;    Social History:  reports that he has quit smoking. His smoking use included cigarettes. He has a 25.00 pack-year smoking history. He has never used smokeless tobacco. He reports that he does not drink alcohol and does not use drugs.  No Known Allergies  Family History  Problem Relation Age of Onset   Heart disease Father    Heart attack Father    Thyroid disease Sister      Prior to Admission medications   Medication Sig Start Date End Date Taking? Authorizing Provider  acetaminophen (TYLENOL) 500 MG tablet Take 1,000 mg by mouth daily as needed (pain).    [provider]  amLODipine (NORVASC) 10 MG tablet Take 10 mg by mouth daily. 06/24/21   [provider]  ANUCORT-HC 25 MG suppository Place 1 suppository rectally daily as needed for pain. 04/29/21   [provider]  atenolol (TENORMIN) 25 MG tablet Take 12.5 mg by mouth 2 (two) times daily. 08/26/20   [name], MD  ferrous sulfate 325 (65 FE) MG tablet Take 325 mg by mouth daily. Patient not taking: Reported on 07/30/2021    [provider]  fluticasone (FLONASE) 50 MCG/ACT nasal spray Place 2 sprays into both nostrils daily as needed for allergies or rhinitis.    [provider]  latanoprost (XALATAN) 0.005 % ophthalmic solution Place 1 drop into both eyes 2 (two) times daily.    [provider]  Multiple Vitamin (MULTIVITAMIN WITH MINERALS) TABS tablet Take 1 tablet by mouth daily.    [provider]  pantoprazole (PROTONIX) 40 MG  tablet Take 40 mg by mouth 2 (two) times daily. 11/09/20   [provider]  polyethylene glycol (MIRALAX / GLYCOLAX) 17 g packet Take 17 g by mouth daily. Patient taking differently: Take 17 g by mouth See admin instructions. Every other day 06/04/20   [name], MD  psyllium (HYDROCIL/METAMUCIL) 95 % PACK Take 1 packet by mouth daily. 06/04/20   [name], MD  rosuvastatin (CRESTOR) 20 MG tablet Take 20 mg by mouth daily.    [provider]  triamcinolone cream (KENALOG) 0.1 % Apply 1 application. topically 3 (three) times daily as needed (facial itching/rash). 05/16/20   [provider]    Physical Exam: Vitals:   08/16/21 2000 08/16/21 2030 08/16/21 2200 08/16/21 2321  BP: (!) 169/88 (!) 164/95 (!) 165/106 (!) 185/103  Pulse: (!) 54 (!) 57 64 60  Resp: 18 (!) 22 19 18   Temp:   98.5 F (36.9 C) 98.5 F (36.9 C)  TempSrc:   Oral Oral  SpO2: 96% 96% 96% 98%  Weight:      Height:       *** Constitutional: NAD, calm, comfortable Eyes: PERRL, lids and conjunctivae normal ENMT: Mucous membranes are moist. Posterior pharynx clear of any exudate or lesions.Normal dentition.  Neck: normal, supple, no masses. Respiratory: clear to auscultation bilaterally, no wheezing, no crackles. Normal respiratory effort. No accessory muscle use.  Cardiovascular: Regular rate and rhythm, no murmurs / rubs / gallops. No extremity edema. 2+ pedal pulses. Abdomen: no tenderness, no masses palpated. No hepatosplenomegaly. Bowel sounds positive.  Musculoskeletal: no clubbing / cyanosis. No joint deformity upper and lower extremities. Good ROM, no contractures. Normal muscle tone.  Skin: no rashes, lesions, ulcers. No induration Neurologic: CN 2-12 grossly intact. Sensation intact. Strength 5/5 in all 4.  Psychiatric: Normal judgment and insight. Alert and oriented x 3. Normal mood.   EKG: Personally reviewed. Sinus bradycardia, rate 49, nonspecific IVCD.  Rate is slower when  compared to prior.  Assessment/Plan Principal Problem:   Elevated LFTs Active Problems:   Chronic ITP (idiopathic thrombocytopenia) (HCC)   Hyperlipidemia   History of CAD   Hypertension   Aneurysm of infrarenal abdominal aorta (HCC)   OSA on CPAP   *** No notes on file *** Assessment and Plan: No notes have been filed under this hospital service. Service: Hospitalist DVT prophylaxis: ***  Code Status: ***  Family Communication: ***  Disposition Plan: ***  Consults called: [facility] Severity of Illness: {Observation/Inpatient:21159}  Zada Finders MD [facility]  If 7PM-7AM, please contact night-coverage [email or web address]  08/16/2021, 11:54 PM

## 2021-08-16 NOTE — ED Triage Notes (Signed)
Pt here from home with c/o right  upper quadrant pain , pt has no gall bladder, has a mrcp  tomorrow at [facility].

## 2021-08-16 NOTE — Progress Notes (Signed)
Patient is transferred from [facility] to [place] at 2325. Alert and oriented x 4. Paged MD admission at the same time. Waiting for orders. Room is set up, vital signs was taken. Pain complained at this time at 8 from 0-10 RUQ.

## 2021-08-17 ENCOUNTER — Inpatient Hospital Stay: Admission: RE | Admit: 2021-08-17 | Payer: Medicare Other | Source: Ambulatory Visit

## 2021-08-17 ENCOUNTER — Observation Stay (HOSPITAL_COMMUNITY): Payer: Medicare Other

## 2021-08-17 DIAGNOSIS — E785 Hyperlipidemia, unspecified: Secondary | ICD-10-CM

## 2021-08-17 DIAGNOSIS — K219 Gastro-esophageal reflux disease without esophagitis: Secondary | ICD-10-CM | POA: Diagnosis present

## 2021-08-17 DIAGNOSIS — D649 Anemia, unspecified: Secondary | ICD-10-CM

## 2021-08-17 DIAGNOSIS — I2510 Atherosclerotic heart disease of native coronary artery without angina pectoris: Secondary | ICD-10-CM | POA: Diagnosis present

## 2021-08-17 DIAGNOSIS — Z9049 Acquired absence of other specified parts of digestive tract: Secondary | ICD-10-CM | POA: Diagnosis not present

## 2021-08-17 DIAGNOSIS — E875 Hyperkalemia: Secondary | ICD-10-CM | POA: Diagnosis not present

## 2021-08-17 DIAGNOSIS — Y733 Surgical instruments, materials and gastroenterology and urology devices (including sutures) associated with adverse incidents: Secondary | ICD-10-CM | POA: Diagnosis not present

## 2021-08-17 DIAGNOSIS — I723 Aneurysm of iliac artery: Secondary | ICD-10-CM | POA: Diagnosis present

## 2021-08-17 DIAGNOSIS — R1011 Right upper quadrant pain: Secondary | ICD-10-CM | POA: Diagnosis present

## 2021-08-17 DIAGNOSIS — N179 Acute kidney failure, unspecified: Secondary | ICD-10-CM | POA: Diagnosis not present

## 2021-08-17 DIAGNOSIS — D62 Acute posthemorrhagic anemia: Secondary | ICD-10-CM | POA: Diagnosis not present

## 2021-08-17 DIAGNOSIS — E874 Mixed disorder of acid-base balance: Secondary | ICD-10-CM | POA: Diagnosis not present

## 2021-08-17 DIAGNOSIS — K8050 Calculus of bile duct without cholangitis or cholecystitis without obstruction: Secondary | ICD-10-CM | POA: Diagnosis not present

## 2021-08-17 DIAGNOSIS — K922 Gastrointestinal hemorrhage, unspecified: Secondary | ICD-10-CM | POA: Diagnosis not present

## 2021-08-17 DIAGNOSIS — K921 Melena: Secondary | ICD-10-CM | POA: Diagnosis not present

## 2021-08-17 DIAGNOSIS — N281 Cyst of kidney, acquired: Secondary | ICD-10-CM | POA: Diagnosis not present

## 2021-08-17 DIAGNOSIS — M150 Primary generalized (osteo)arthritis: Secondary | ICD-10-CM | POA: Diagnosis not present

## 2021-08-17 DIAGNOSIS — Z953 Presence of xenogenic heart valve: Secondary | ICD-10-CM | POA: Diagnosis not present

## 2021-08-17 DIAGNOSIS — D693 Immune thrombocytopenic purpura: Secondary | ICD-10-CM | POA: Diagnosis present

## 2021-08-17 DIAGNOSIS — M1611 Unilateral primary osteoarthritis, right hip: Secondary | ICD-10-CM | POA: Diagnosis not present

## 2021-08-17 DIAGNOSIS — I10 Essential (primary) hypertension: Secondary | ICD-10-CM

## 2021-08-17 DIAGNOSIS — I739 Peripheral vascular disease, unspecified: Secondary | ICD-10-CM | POA: Diagnosis present

## 2021-08-17 DIAGNOSIS — G4730 Sleep apnea, unspecified: Secondary | ICD-10-CM | POA: Diagnosis not present

## 2021-08-17 DIAGNOSIS — K91840 Postprocedural hemorrhage and hematoma of a digestive system organ or structure following a digestive system procedure: Secondary | ICD-10-CM | POA: Diagnosis not present

## 2021-08-17 DIAGNOSIS — Z452 Encounter for adjustment and management of vascular access device: Secondary | ICD-10-CM | POA: Diagnosis not present

## 2021-08-17 DIAGNOSIS — I714 Abdominal aortic aneurysm, without rupture, unspecified: Secondary | ICD-10-CM | POA: Diagnosis not present

## 2021-08-17 DIAGNOSIS — I7143 Infrarenal abdominal aortic aneurysm, without rupture: Secondary | ICD-10-CM | POA: Diagnosis present

## 2021-08-17 DIAGNOSIS — K920 Hematemesis: Secondary | ICD-10-CM | POA: Diagnosis not present

## 2021-08-17 DIAGNOSIS — K831 Obstruction of bile duct: Secondary | ICD-10-CM | POA: Diagnosis not present

## 2021-08-17 DIAGNOSIS — K5730 Diverticulosis of large intestine without perforation or abscess without bleeding: Secondary | ICD-10-CM | POA: Diagnosis not present

## 2021-08-17 DIAGNOSIS — R7989 Other specified abnormal findings of blood chemistry: Secondary | ICD-10-CM | POA: Diagnosis not present

## 2021-08-17 DIAGNOSIS — I959 Hypotension, unspecified: Secondary | ICD-10-CM | POA: Diagnosis not present

## 2021-08-17 DIAGNOSIS — Z87891 Personal history of nicotine dependence: Secondary | ICD-10-CM | POA: Diagnosis not present

## 2021-08-17 DIAGNOSIS — H409 Unspecified glaucoma: Secondary | ICD-10-CM | POA: Diagnosis present

## 2021-08-17 DIAGNOSIS — Z9989 Dependence on other enabling machines and devices: Secondary | ICD-10-CM | POA: Diagnosis not present

## 2021-08-17 DIAGNOSIS — K838 Other specified diseases of biliary tract: Secondary | ICD-10-CM | POA: Diagnosis not present

## 2021-08-17 DIAGNOSIS — K8033 Calculus of bile duct with acute cholangitis with obstruction: Secondary | ICD-10-CM | POA: Diagnosis present

## 2021-08-17 DIAGNOSIS — K2289 Other specified disease of esophagus: Secondary | ICD-10-CM | POA: Diagnosis not present

## 2021-08-17 DIAGNOSIS — R578 Other shock: Secondary | ICD-10-CM | POA: Diagnosis not present

## 2021-08-17 DIAGNOSIS — K8309 Other cholangitis: Secondary | ICD-10-CM | POA: Diagnosis not present

## 2021-08-17 DIAGNOSIS — M1990 Unspecified osteoarthritis, unspecified site: Secondary | ICD-10-CM | POA: Diagnosis present

## 2021-08-17 DIAGNOSIS — R600 Localized edema: Secondary | ICD-10-CM | POA: Diagnosis not present

## 2021-08-17 DIAGNOSIS — N401 Enlarged prostate with lower urinary tract symptoms: Secondary | ICD-10-CM | POA: Diagnosis present

## 2021-08-17 DIAGNOSIS — K449 Diaphragmatic hernia without obstruction or gangrene: Secondary | ICD-10-CM | POA: Diagnosis present

## 2021-08-17 DIAGNOSIS — Z0389 Encounter for observation for other suspected diseases and conditions ruled out: Secondary | ICD-10-CM | POA: Diagnosis not present

## 2021-08-17 DIAGNOSIS — K5750 Diverticulosis of both small and large intestine without perforation or abscess without bleeding: Secondary | ICD-10-CM | POA: Diagnosis present

## 2021-08-17 DIAGNOSIS — Z4682 Encounter for fitting and adjustment of non-vascular catheter: Secondary | ICD-10-CM | POA: Diagnosis not present

## 2021-08-17 DIAGNOSIS — J9811 Atelectasis: Secondary | ICD-10-CM | POA: Diagnosis not present

## 2021-08-17 DIAGNOSIS — Y658 Other specified misadventures during surgical and medical care: Secondary | ICD-10-CM | POA: Diagnosis not present

## 2021-08-17 DIAGNOSIS — G4733 Obstructive sleep apnea (adult) (pediatric): Secondary | ICD-10-CM | POA: Diagnosis present

## 2021-08-17 DIAGNOSIS — K222 Esophageal obstruction: Secondary | ICD-10-CM | POA: Diagnosis not present

## 2021-08-17 DIAGNOSIS — R338 Other retention of urine: Secondary | ICD-10-CM | POA: Diagnosis not present

## 2021-08-17 LAB — CBC
HCT: 39.9 % (ref 39.0–52.0)
Hemoglobin: 12.9 g/dL — ABNORMAL LOW (ref 13.0–17.0)
MCH: 28.8 pg (ref 26.0–34.0)
MCHC: 32.3 g/dL (ref 30.0–36.0)
MCV: 89.1 fL (ref 80.0–100.0)
Platelets: 109 10*3/uL — ABNORMAL LOW (ref 150–400)
RBC: 4.48 MIL/uL (ref 4.22–5.81)
RDW: 16.4 % — ABNORMAL HIGH (ref 11.5–15.5)
WBC: 7.9 10*3/uL (ref 4.0–10.5)
nRBC: 0 % (ref 0.0–0.2)

## 2021-08-17 LAB — COMPREHENSIVE METABOLIC PANEL
ALT: 398 U/L — ABNORMAL HIGH (ref 0–44)
AST: 416 U/L — ABNORMAL HIGH (ref 15–41)
Albumin: 4.3 g/dL (ref 3.5–5.0)
Alkaline Phosphatase: 268 U/L — ABNORMAL HIGH (ref 38–126)
Anion gap: 12 (ref 5–15)
BUN: 11 mg/dL (ref 8–23)
CO2: 27 mmol/L (ref 22–32)
Calcium: 9.3 mg/dL (ref 8.9–10.3)
Chloride: 99 mmol/L (ref 98–111)
Creatinine, Ser: 0.84 mg/dL (ref 0.61–1.24)
GFR, Estimated: >60 mL/min (ref >60)
Glucose, Bld: 125 mg/dL — ABNORMAL HIGH (ref 70–99)
Potassium: 3.9 mmol/L (ref 3.5–5.1)
Sodium: 138 mmol/L (ref 135–145)
Total Bilirubin: 4.7 mg/dL — ABNORMAL HIGH (ref 0.3–1.2)
Total Protein: 8.2 g/dL — ABNORMAL HIGH (ref 6.5–8.1)

## 2021-08-17 LAB — MAGNESIUM: Magnesium: 1.9 mg/dL (ref 1.7–2.4)

## 2021-08-17 MED ORDER — SODIUM CHLORIDE 0.9% FLUSH
3.0000 mL | Freq: Two times a day (BID) | INTRAVENOUS | Status: DC
  Administered 2021-08-17 – 2021-08-25 (×11): 3 mL via INTRAVENOUS

## 2021-08-17 MED ORDER — POTASSIUM CHLORIDE IN NACL 20-0.9 MEQ/L-% IV SOLN
INTRAVENOUS | Status: AC
  Filled 2021-08-17: qty 1000

## 2021-08-17 MED ORDER — ATENOLOL 25 MG PO TABS
12.5000 mg | ORAL_TABLET | Freq: Two times a day (BID) | ORAL | Status: DC
  Administered 2021-08-17 – 2021-08-25 (×12): 12.5 mg via ORAL
  Filled 2021-08-17 (×13): qty 1

## 2021-08-17 MED ORDER — ONDANSETRON HCL 4 MG/2ML IJ SOLN
4.0000 mg | Freq: Four times a day (QID) | INTRAMUSCULAR | Status: DC | PRN
  Administered 2021-08-17 – 2021-08-20 (×3): 4 mg via INTRAVENOUS
  Filled 2021-08-17 (×2): qty 2

## 2021-08-17 MED ORDER — SODIUM CHLORIDE 0.9 % IV SOLN
2.0000 g | Freq: Every day | INTRAVENOUS | Status: DC
  Administered 2021-08-17 – 2021-08-25 (×9): 2 g via INTRAVENOUS
  Filled 2021-08-17 (×9): qty 20

## 2021-08-17 MED ORDER — AMLODIPINE BESYLATE 10 MG PO TABS
10.0000 mg | ORAL_TABLET | Freq: Every day | ORAL | Status: DC
  Administered 2021-08-17 – 2021-08-21 (×3): 10 mg via ORAL
  Filled 2021-08-17 (×3): qty 1

## 2021-08-17 MED ORDER — ROSUVASTATIN CALCIUM 10 MG PO TABS
20.0000 mg | ORAL_TABLET | Freq: Every day | ORAL | Status: DC
Start: 2021-08-17 — End: 2021-08-17

## 2021-08-17 MED ORDER — HYDRALAZINE HCL 20 MG/ML IJ SOLN
10.0000 mg | INTRAMUSCULAR | Status: DC | PRN
  Administered 2021-08-17: 10 mg via INTRAVENOUS
  Filled 2021-08-17: qty 1

## 2021-08-17 MED ORDER — ONDANSETRON HCL 4 MG PO TABS: 4.0000 mg | ORAL_TABLET | Freq: Four times a day (QID) | ORAL | Status: DC | PRN

## 2021-08-17 MED ORDER — OXYCODONE HCL 5 MG PO TABS: 5.0000 mg | ORAL_TABLET | Freq: Four times a day (QID) | ORAL | Status: DC | PRN

## 2021-08-17 MED ORDER — GADOBUTROL 1 MMOL/ML IV SOLN
9.0000 mL | Freq: Once | INTRAVENOUS | Status: AC | PRN
  Administered 2021-08-17: 9 mL via INTRAVENOUS

## 2021-08-17 MED ORDER — PANTOPRAZOLE SODIUM 40 MG PO TBEC
40.0000 mg | DELAYED_RELEASE_TABLET | Freq: Two times a day (BID) | ORAL | Status: DC
  Administered 2021-08-17 – 2021-08-18 (×4): 40 mg via ORAL
  Filled 2021-08-17 (×4): qty 1

## 2021-08-17 NOTE — Consult Note (Signed)
Referring Provider:  ED Primary Care Physician:  [name], MD Primary Gastroenterologist: [name]  Reason for Consultation: Abdominal pain, abnormal LFTs  HPI: Lonnie Hansen is a 71 y.o. male with past medical history of coronary artery disease, history of severe aortic insufficiency s/p aortic root grafting and bioprosthetic AVR, history of chronic ITP receiving Nplate, history of diverticular bleed requiring coil embolization in May 2023 presented to the hospital with abdominal pain, nausea and vomiting.  Patient with abnormal LFTs since August 13, 2021.  Blood work yesterday showed stable LFTs with T. bili of 3, AST ALT in 300 range and alkaline phosphatase 250.  Mild elevated lipase at 68.  Low platelets otherwise normal CBC.  Blood work this morning showed elevation of T. bili to 4.7 as well as mild elevation of AST ALT and alk phos compared to yesterday.  CT abdomen pelvis with contrast yesterday showed slight enhancement and thickening of the common bile duct wall as well as increased infrarenal abdominal aortic aneurysm measuring 5.3 cm now.  According to patient, his abdominal pain is located at right upper quadrant and intermittent in nature.  Sometimes it feels like cramps.  Sometimes sharp pain.  Denies any blood in the stool or black stool.  Denies vomiting.  Occasional nausea.  Past Medical History:  Diagnosis Date   AAA (abdominal aortic aneurysm) (HCC)    Anemia    Arthritis    Asymptomatic bilateral carotid artery stenosis 08/2015   1-39%    Cataracts, bilateral    Chronic ITP (idiopathic thrombocytopenia) ([place]) 03/31/2018   Coronary artery disease    Diverticulosis    Enlarged aorta (HCC)    Enlarged prostate    slightly   GERD (gastroesophageal reflux disease)    takes Pantoprazole daily as needed   Glaucoma    uses eye drops daily   Headache    History of colon polyps    benign   History of kidney stones    Hyperlipidemia    no on any meds    Hypertension    takes Amlodipine and Atenolol daily   OSA on CPAP    Vocal cord nodule    pt. states  it's a" growth on vocal cord"    Past Surgical History:  Procedure Laterality Date   AORTIC ARCH ANGIOGRAPHY N/A 04/16/2016   Procedure: Aortic Arch Angiography;  Surgeon: [name] Martinique, MD;  Location: [place] [facility];  Service: Cardiovascular;  Laterality: N/A;   AORTIC VALVE REPLACEMENT N/A 09/15/2016   Procedure: AORTIC VALVE REPLACEMENT (AVR);  Surgeon: Grace Isaac, MD;  Location: [place];  Service: Open Heart Surgery;  Laterality: N/A;  Using 76mm Edwards Perimount Magna Ease Aortic Bioprosthesis Valve   ASCENDING AORTIC ROOT REPLACEMENT N/A 09/15/2016   Procedure: ASCENDING AORTIC ROOT REPLACEMENT;  Surgeon: Grace Isaac, MD;  Location: [place];  Service: Open Heart Surgery;  Laterality: N/A;  Using 9mm Gelweave Valsalva Graft   CHOLECYSTECTOMY N/A 12/19/2020   Procedure: LAPAROSCOPIC CHOLECYSTECTOMY WITH POSSIBLE INTRAOPERATIVE CHOLANGIOGRAM;  Surgeon: [name], MD;  Location: [place];  Service: General;  Laterality: N/A;   COLONOSCOPY     COLONOSCOPY Left 04/16/2019   Procedure: COLONOSCOPY;  Surgeon: Arta Silence, MD;  Location: [facility]COPY;  Service: Endoscopy;  Laterality: Left;   COLONOSCOPY N/A 06/01/2020   Procedure: COLONOSCOPY;  Surgeon: Ronnette Juniper, MD;  Location: [place];  Service: Gastroenterology;  Laterality: N/A;   COLONOSCOPY WITH ESOPHAGOGASTRODUODENOSCOPY (EGD)     COLONOSCOPY WITH PROPOFOL  N/A 05/14/2021   Procedure: COLONOSCOPY WITH PROPOFOL;  Surgeon: Ronnette Juniper, MD;  Location: [facility];  Service: Gastroenterology;  Laterality: N/A;   ESOPHAGOGASTRODUODENOSCOPY N/A 05/25/2020   Procedure: ESOPHAGOGASTRODUODENOSCOPY (EGD);  Surgeon: Wilford Corner, MD;  Location: [place];  Service: Endoscopy;  Laterality: N/A;   ESOPHAGOGASTRODUODENOSCOPY (EGD) WITH PROPOFOL Left 04/16/2019   Procedure: ESOPHAGOGASTRODUODENOSCOPY (EGD) WITH PROPOFOL;   Surgeon: Arta Silence, MD;  Location: [facility] ENDOSCOPY;  Service: Endoscopy;  Laterality: Left;   FLEXIBLE SIGMOIDOSCOPY N/A 05/25/2020   Procedure: FLEXIBLE SIGMOIDOSCOPY;  Surgeon: Wilford Corner, MD;  Location: [place];  Service: Endoscopy;  Laterality: N/A;   IR ANGIOGRAM SELECTIVE EACH ADDITIONAL VESSEL  05/26/2021   IR ANGIOGRAM VISCERAL SELECTIVE  05/26/2021   IR EMBO ART  VEN HEMORR LYMPH EXTRAV  INC GUIDE ROADMAPPING  05/26/2021   IR THORACENTESIS ASP PLEURAL SPACE W/IMG GUIDE  10/23/2016   IR US GUIDE VASC ACCESS LEFT  05/26/2021   IR US GUIDE VASC ACCESS RIGHT  05/26/2021   MICROLARYNGOSCOPY Right 09/20/2017   Procedure: MICROLARYNGOSCOPY WITH  EXCISION OF VOCAL CORD LESION;  Surgeon: Leta Baptist, MD;  Location: [place];  Service: ENT;  Laterality: Right;   MULTIPLE EXTRACTIONS WITH ALVEOLOPLASTY N/A 06/10/2016   Procedure: Extraction of tooth #'s 2,8,13,15, and 29  with alveoloplasty, maxillary right and left buccal exostoses reductions, and gross debridement of remaining teeth.;  Surgeon: Lenn Cal, DDS;  Location: [place];  Service: Oral Surgery;  Laterality: N/A;   POLYPECTOMY  05/14/2021   Procedure: POLYPECTOMY;  Surgeon: Ronnette Juniper, MD;  Location: [facility];  Service: Gastroenterology;;   RIGHT/LEFT HEART CATH AND CORONARY ANGIOGRAPHY N/A 04/16/2016   Procedure: Right/Left Heart Cath and Coronary Angiography;  Surgeon: [name] Martinique, MD;  Location: [place] [facility];  Service: Cardiovascular;  Laterality: N/A;   TEE WITHOUT CARDIOVERSION N/A 09/15/2016   Procedure: TRANSESOPHAGEAL ECHOCARDIOGRAM (TEE);  Surgeon: Grace Isaac, MD;  Location: [place];  Service: Open Heart Surgery;  Laterality: N/A;    Prior to Admission medications   Medication Sig Start Date End Date Taking? Authorizing Provider  acetaminophen (TYLENOL) 500 MG tablet Take 1,000 mg by mouth daily as needed (pain).   Yes [provider]  amLODipine (NORVASC) 10 MG tablet Take 10  mg by mouth daily. 06/24/21  Yes [provider]  atenolol (TENORMIN) 25 MG tablet Take 12.5 mg by mouth 2 (two) times daily. 08/26/20  Yes [name], MD  fluticasone [facility]) 50 MCG/ACT nasal spray Place 2 sprays into both nostrils daily as needed for allergies or rhinitis.   Yes [provider]  latanoprost (XALATAN) 0.005 % ophthalmic solution Place 1 drop into both eyes 2 (two) times daily.   Yes [provider]  pantoprazole (PROTONIX) 40 MG tablet Take 40 mg by mouth 2 (two) times daily. 11/09/20  Yes [provider]  polyethylene glycol (MIRALAX / GLYCOLAX) 17 g packet Take 17 g by mouth daily. Patient taking differently: Take 17 g by mouth daily as needed for mild constipation. Every other day 06/04/20  Yes [name], MD  psyllium (HYDROCIL/METAMUCIL) 95 % PACK Take 1 packet by mouth daily. Patient taking differently: Take 1 packet by mouth daily as needed for mild constipation. 06/04/20  Yes [name], MD  rosuvastatin (CRESTOR) 20 MG tablet Take 20 mg by mouth daily.   Yes [provider]  triamcinolone cream (KENALOG) 0.1 % Apply 1 application. topically 3 (three) times daily as needed (facial itching/rash). 05/16/20  Yes [provider]  ferrous sulfate 325 (65 FE) MG tablet Take 325 mg by mouth daily. Patient not taking: Reported on 07/30/2021    [provider]  Multiple Vitamin (MULTIVITAMIN WITH MINERALS) TABS tablet Take 1 tablet by mouth daily. Patient not taking: Reported on 08/17/2021    [provider]    Scheduled Meds:  amLODipine  10 mg Oral Daily   atenolol  12.5 mg Oral BID   pantoprazole  40 mg Oral BID AC   sodium chloride flush  3 mL Intravenous Q12H   Continuous Infusions:  0.9 % NaCl with KCl 20 mEq / L Stopped (08/17/21 0841)   cefTRIAXone (ROCEPHIN)  IV 2 g (08/17/21 0830)   PRN Meds:.hydrALAZINE, morphine injection, ondansetron **OR** ondansetron (ZOFRAN) IV  Allergies as of  08/16/2021   (No Known Allergies)    Family History  Problem Relation Age of Onset   Heart disease Father    Heart attack Father    Thyroid disease Sister     Social History   Socioeconomic History   Marital status: Married    Spouse name: Not on file   Number of children: 1   Years of education: Not on file   Highest education level: Not on file  Occupational History   Occupation: Musician  Tobacco Use   Smoking status: Former    Packs/day: 1.00    Years: 25.00    Total pack years: 25.00    Types: Cigarettes   Smokeless tobacco: Never  Vaping Use   Vaping Use: Never used  Substance and Sexual Activity   Alcohol use: No    Alcohol/week: 0.0 standard drinks of alcohol   Drug use: No   Sexual activity: Not on file  Other Topics Concern   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   Social Determinants of Health   Financial Resource Strain: Not on file  Food Insecurity: No Food Insecurity (05/30/2020)   Hunger Vital Sign    Worried About Running Out of Food in the Last Year: Never true    Ran Out of Food in the Last Year: Never true  Transportation Needs: No Transportation Needs (05/30/2020)   PRAPARE - Hydrologist (Medical): No    Lack of Transportation (Non-Medical): No  Physical Activity: Not on file  Stress: Not on file  Social Connections: Not on file  Intimate Partner Violence: Not on file    Review of Systems: All negative except as stated above in HPI.  Physical Exam: Vital signs: Vitals:   08/17/21 0308 08/17/21 0700  BP: (!) 156/77 (!) 171/86  Pulse: 61 61  Resp: 16 17  Temp: 99.7 F (37.6 C) 98.3 F (36.8 C)  SpO2: 99% 100%   Last BM Date : 08/16/21 General:   Alert,  Well-developed, well-nourished, pleasant and cooperative in NAD HEENT -cephalic, atraumatic, extraocular movement intact Lungs: No visible respiratory distress, anterior exam only Heart:   Regular rate and rhythm; no murmurs, clicks, rubs,  or gallops. Abdomen: Right upper quadrant tenderness to palpation, bowel sounds present, no peritoneal signs, abdomen is soft Rectal:  Deferred  GI:  Lab Results: Recent Labs    08/14/21 1848 08/16/21 1833 08/17/21 0548  WBC 5.7 7.2 7.9  HGB 12.4* 13.3 12.9*  HCT 38.2* 40.5 39.9  PLT 90* 100* 109*   BMET Recent Labs    08/14/21 1848  08/16/21 1833 08/17/21 0548  NA 137 138 138  K 3.6 3.0* 3.9  CL 100 98 99  CO2 $Re'28 24 27  'pqM$ GLUCOSE 135* 129* 125*  BUN $Re'23 16 11  'LQB$ CREATININE 1.20 1.08 0.84  CALCIUM 9.7 10.1 9.3   LFT Recent Labs    08/17/21 0548  PROT 8.2*  ALBUMIN 4.3  AST 416*  ALT 398*  ALKPHOS 268*  BILITOT 4.7*   PT/INR No results for input(s): "LABPROT", "INR" in the last 72 hours.   Studies/Results: CT Abdomen Pelvis W Contrast  Result Date: 08/16/2021 CLINICAL DATA:  Abdominal pain, acute, nonlocalized ruq abd pain. right upper quadrant pain , pt has no gall bladder, has a mrcp tomorrow at [facility]. EXAM: CT ABDOMEN AND PELVIS WITH CONTRAST TECHNIQUE: Multidetector CT imaging of the abdomen and pelvis was performed using the standard protocol following bolus administration of intravenous contrast. RADIATION DOSE REDUCTION: This exam was performed according to the departmental dose-optimization program which includes automated exposure control, adjustment of the mA and/or kV according to patient size and/or use of iterative reconstruction technique. CONTRAST:  101mL OMNIPAQUE IOHEXOL 300 MG/ML  SOLN COMPARISON:  CT abdomen pelvis angiography 05/11/2021 FINDINGS: Lower chest: Elevated left hemidiaphragm with passive atelectasis of the left lower lobe. Otherwise no acute abnormality. Partially visualized aortic valve replacement. Possible tiny hiatal hernia. Hepatobiliary: Several scattered subcentimeter hypodensities too small to characterize. Otherwise no focal liver abnormality. Status post cholecystectomy. No  biliary dilatation. Query slightly enhancement/thickening of the common bile duct wall. Pancreas: No focal lesion. Normal pancreatic contour. No surrounding inflammatory changes. No main pancreatic ductal dilatation. Spleen: Normal in size without focal abnormality. Adrenals/Urinary Tract: No adrenal nodule bilaterally. Bilateral kidneys enhance symmetrically. Simple free fluid parapelvic cyst on the right. Other simple fluid densities within the kidneys likely represent simple renal cysts. Simple renal cysts, in the absence of clinically indicated signs/symptoms, require no independent follow-up. Redemonstration of a 1.3 cm left renal calcification. No hydronephrosis. No hydroureter. The urinary bladder is unremarkable. Stomach/Bowel: Stomach is within normal limits. No evidence of bowel wall thickening or dilatation. Diffuse colonic diverticulosis. Appendix appears normal. Vascular/Lymphatic: Interval increase of an infrarenal abdominal aorta aneurysm measuring up to 5.3 x 4.8 cm (from 4.8 x 4.4 cm) with finding extending for approximately 6 cm in the craniocaudal dimension and extending to the aortic bifurcation. 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 Mild atherosclerotic plaque of the aorta and its branches. No abdominal, pelvic, or inguinal lymphadenopathy. Reproductive: Prostate is unremarkable. Other: No intraperitoneal free fluid. No intraperitoneal free gas. No organized fluid collection. Musculoskeletal: No abdominal wall hernia or abnormality. No suspicious lytic or blastic osseous lesions. No acute displaced fracture. Multilevel degenerative changes of the spine. 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 IMPRESSION: 1. Interval increase of an infrarenal abdominal aorta aneurysm measuring up to 5.3 x 4.8 cm (from 4.8 x 4.4 cm). Recommend referral  to a vascular specialist. This recommendation follows ACR consensus guidelines: White Paper of the ACR Incidental Findings Committee II on Vascular Findings. J Am Coll Radiol 2013; 10:789-794. 2. 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 3. 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 4. Status post cholecystectomy. Query slightly enhancement/thickening of the common bile duct wall. 5. Possible tiny hiatal hernia. 6.  Diffuse colonic diverticulosis with no acute diverticulitis. 7. Nonobstructive left nephrolithiasis measuring up to 1.3 cm. 8. 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 Electronically Signed   By: Iven Finn M.D.   On: 08/16/2021 20:41    Impression/Plan:  -Right upper quadrant abdominal pain with elevated LFTs.  He status postcholecystectomy.  Recent CT scan showed questionable thickening of the common bile duct wall.  -Chronic ITP -History of diverticular bleed s/p coil embolization in May 2023  Recommendations --------------------------- -Follow MRI MRCP -Continue Rocephin and PPI for now -Repeat labs in the morning -Okay to have soft diet after MRI from GI standpoint -GI will follow   LOS: 0 days   Otis Brace  MD, FACP 08/17/2021, 10:32 AM  Contact #  [phone]

## 2021-08-17 NOTE — Assessment & Plan Note (Signed)
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  He has been previously following with vascular surgery, Dr. Oneida Alar.  Will need follow-up with vascular surgery.

## 2021-08-17 NOTE — Progress Notes (Signed)
Pt refused cpap tonight. He said he cant even remember last time he wore it.

## 2021-08-17 NOTE — Assessment & Plan Note (Signed)
Continue home amlodipine and atenolol.

## 2021-08-17 NOTE — Plan of Care (Signed)

## 2021-08-17 NOTE — Assessment & Plan Note (Signed)
Hold rosuvastatin with transaminitis.

## 2021-08-17 NOTE — Progress Notes (Signed)
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  He has been followed by GI as an outpatient given rising LFTs and right upper quadrant pain, was scheduled for an MRCP as an outpatient but got sick with worsening abdominal pain and decided to come to the hospital.  He was found to have elevated LFTs and bilirubin and was admitted to the hospital.  Subjective / 24h Interval events: He is feeling terrible this morning.  He does not think he can take 1 more day of feeling as sick as he is right now.  Has right upper quadrant pain, nausea, and feels hot.  Assesement and Plan: Principal Problem:   Elevated LFTs Active Problems:   Chronic ITP (idiopathic thrombocytopenia) (HCC)   Hyperlipidemia   History of CAD   Hypertension   Aneurysm of infrarenal abdominal aorta (HCC)   OSA on CPAP   Principal problem Elevated LFTs, concern for choledocholithiasis-he has a history of cholecystectomy.  CT scan of the abdomen and pelvis did show slight enhancement and thickening of the CBD wall.  LFTs are worse today, with rising AST, ALT as well as bilirubin with clinical concern for choledocholithiasis.  Given persistent and worsening symptoms, low-grade temp 99.7, also start antibiotics  -GI consulted, discussed with Dr. Alessandra Bevels, will see today  Active problems Infrarenal abdominal aortic aneurysm-CT scan done this admission showed increase of an infrarenal abdominal aortic aneurysm now measuring 5.3 x 4.8 cm (from 4.8 x 4.4 cm), also an increase in the size of the left internal iliac aneurysm now measuring 3.7  cm from 3.3 in the past.  Seeing Dr. Donzetta Matters with vascular, will likely need follow-up as soon as possible after discharge  Chronic ITP-platelets 109 this morning.  Stable.  Follows with oncology as an outpatient  OSA on CPAP-continue nightly CPAP  Essential hypertension-continue amlodipine, atenolol.  Blood pressure slightly on the high side due to pain.  Will use as needed's  History of CAD-hold statin with transaminitis, no chest pain.  Not on antiplatelets likely due to his thrombocytopenia    Scheduled Meds:  amLODipine  10 mg Oral Daily   atenolol  12.5 mg Oral BID   pantoprazole  40 mg Oral BID AC   sodium chloride flush  3 mL Intravenous Q12H   Continuous Infusions:  0.9 % NaCl with KCl 20 mEq / L Stopped (08/17/21 0841)   cefTRIAXone (ROCEPHIN)  IV 2 g (08/17/21 0830)   PRN Meds:.hydrALAZINE, morphine injection, ondansetron **OR** ondansetron (ZOFRAN) IV  Diet Orders (From admission, onward)     Start     Ordered   08/17/21 0036  Diet NPO time specified Except for: Ice Chips, Sips with Meds  Diet effective now       Question Answer Comment  Except for Ice Chips   Except for Sips with Meds      08/17/21 0035            DVT prophylaxis: SCDs Start: 08/17/21 0038   Lab Results  Component Value Date   PLT 109 (L) 08/17/2021      Code Status: Full Code  Family Communication: No family  at bedside  Status is: Observation The patient will require care spanning > 2 midnights and should be moved to inpatient because: Persistent symptoms, worsening LFTs   Level of care: Telemetry  Consultants:  Gastroenterology  Objective: Vitals:   08/16/21 2200 08/16/21 2321 08/17/21 0308 08/17/21 0700  BP: (!) 165/106 (!) 185/103 (!) 156/77 (!) 171/86  Pulse: 64 60 61 61  Resp: '19 18 16 17  '$ Temp: 98.5 F (36.9 C) 98.5 F (36.9 C) 99.7 F (37.6 C) 98.3 F (36.8 C)  TempSrc: Oral Oral Oral Oral  SpO2: 96% 98% 99% 100%  Weight:      Height:        Intake/Output  Summary (Last 24 hours) at 08/17/2021 1105 Last data filed at 08/17/2021 0500 Gross per 24 hour  Intake 990.61 ml  Output 350 ml  Net 640.61 ml   Wt Readings from Last 3 Encounters:  08/16/21 91.2 kg  06/30/21 91.5 kg  06/18/21 90.7 kg    Examination:  Constitutional: NAD Eyes: no scleral icterus ENMT: Mucous membranes are moist.  Neck: normal, supple Respiratory: clear to auscultation bilaterally, no wheezing, no crackles. Normal respiratory effort. No accessory muscle use.  Cardiovascular: Regular rate and rhythm.  Abdomen: Bowel sounds positive Musculoskeletal: no clubbing / cyanosis.  Skin: no rashes Neurologic: non focal   Data Reviewed: I have independently reviewed following labs and imaging studies   CBC Recent Labs  Lab 08/13/21 1410 08/14/21 1848 08/16/21 1833 08/17/21 0548  WBC 6.3 5.7 7.2 7.9  HGB 12.1* 12.4* 13.3 12.9*  HCT 36.4* 38.2* 40.5 39.9  PLT 78* 90* 100* 109*  MCV 87.7 89.7 87.3 89.1  MCH 29.2 29.1 28.7 28.8  MCHC 33.2 32.5 32.8 32.3  RDW 16.4* 16.3* 16.3* 16.4*  LYMPHSABS 0.5* 0.7 0.9  --   MONOABS 0.9 0.9 1.0  --   EOSABS 0.0 0.1 0.0  --   BASOSABS 0.0 0.0 0.0  --     Recent Labs  Lab 08/13/21 1410 08/14/21 1848 08/16/21 1833 08/16/21 1835 08/17/21 0548  NA 135 137 138  --  138  K 3.1* 3.6 3.0*  --  3.9  CL 97* 100 98  --  99  CO2 '26 28 24  '$ --  27  GLUCOSE 132* 135* 129*  --  125*  BUN 26* 23 16  --  11  CREATININE 1.28* 1.20 1.08  --  0.84  CALCIUM 9.6 9.7 10.1  --  9.3  AST 164* 322* 397*  --  416*  ALT 153* 225* 370*  --  398*  ALKPHOS 162* 170* 250*  --  268*  BILITOT 3.9* 2.5* 3.0*  --  4.7*  ALBUMIN 4.6 4.3 4.8  --  4.3  MG  --   --   --   --  1.9  LATICACIDVEN  --   --   --  1.3  --     ------------------------------------------------------------------------------------------------------------------ No results for input(s): "CHOL", "HDL", "LDLCALC", "TRIG", "CHOLHDL", "LDLDIRECT" in the last 72 hours.  Lab  Results  Component Value Date   HGBA1C 5.3 09/11/2016   ------------------------------------------------------------------------------------------------------------------ No results for input(s): "TSH", "T4TOTAL", "T3FREE", "THYROIDAB" in the last 72 hours.  Invalid input(s): "FREET3"  Cardiac Enzymes No results for input(s): "CKMB", "TROPONINI", "MYOGLOBIN" in the last 168 hours.  Invalid input(s): "CK" ------------------------------------------------------------------------------------------------------------------    Component Value Date/Time   BNP 138.4 (H) 10/30/2016 1420    CBG: No results for input(s): "GLUCAP" in the last 168 hours.  No results  found for this or any previous visit (from the past 240 hour(s)).   Radiology Studies: CT Abdomen Pelvis W Contrast  Result Date: 08/16/2021 CLINICAL DATA:  Abdominal pain, acute, nonlocalized ruq abd pain. right upper quadrant pain , pt has no gall bladder, has a mrcp tomorrow at [facility]. EXAM: CT ABDOMEN AND PELVIS WITH CONTRAST TECHNIQUE: Multidetector CT imaging of the abdomen and pelvis was performed using the standard protocol following bolus administration of intravenous contrast. RADIATION DOSE REDUCTION: This exam was performed according to the departmental dose-optimization program which includes automated exposure control, adjustment of the mA and/or kV according to patient size and/or use of iterative reconstruction technique. CONTRAST:  29m OMNIPAQUE IOHEXOL 300 MG/ML  SOLN COMPARISON:  CT abdomen pelvis angiography 05/11/2021 FINDINGS: Lower chest: Elevated left hemidiaphragm with passive atelectasis of the left lower lobe. Otherwise no acute abnormality. Partially visualized aortic valve replacement. Possible tiny hiatal hernia. Hepatobiliary: Several scattered subcentimeter hypodensities too small to characterize. Otherwise no focal liver abnormality. Status post cholecystectomy. No biliary dilatation. Query slightly  enhancement/thickening of the common bile duct wall. Pancreas: No focal lesion. Normal pancreatic contour. No surrounding inflammatory changes. No main pancreatic ductal dilatation. Spleen: Normal in size without focal abnormality. Adrenals/Urinary Tract: No adrenal nodule bilaterally. Bilateral kidneys enhance symmetrically. Simple free fluid parapelvic cyst on the right. Other simple fluid densities within the kidneys likely represent simple renal cysts. Simple renal cysts, in the absence of clinically indicated signs/symptoms, require no independent follow-up. Redemonstration of a 1.3 cm left renal calcification. No hydronephrosis. No hydroureter. The urinary bladder is unremarkable. Stomach/Bowel: Stomach is within normal limits. No evidence of bowel wall thickening or dilatation. Diffuse colonic diverticulosis. Appendix appears normal. Vascular/Lymphatic: Interval increase of an infrarenal abdominal aorta aneurysm measuring up to 5.3 x 4.8 cm (from 4.8 x 4.4 cm) with finding extending for approximately 6 cm in the craniocaudal dimension and extending to the aortic bifurcation. 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 Mild atherosclerotic plaque of the aorta and its branches. No abdominal, pelvic, or inguinal lymphadenopathy. Reproductive: Prostate is unremarkable. Other: No intraperitoneal free fluid. No intraperitoneal free gas. No organized fluid collection. Musculoskeletal: No abdominal wall hernia or abnormality. No suspicious lytic or blastic osseous lesions. No acute displaced fracture. Multilevel degenerative changes of the spine. 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 IMPRESSION: 1. Interval increase of an infrarenal abdominal aorta aneurysm measuring up to 5.3 x 4.8 cm (from 4.8 x 4.4 cm). Recommend referral to a vascular specialist. This  recommendation follows ACR consensus guidelines: White Paper of the ACR Incidental Findings Committee II on Vascular Findings. J Am Coll Radiol 2013; 10:789-794. 2. 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 3. 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 4. Status post cholecystectomy. Query slightly enhancement/thickening of the common bile duct wall. 5. Possible tiny hiatal hernia. 6. Diffuse colonic diverticulosis with no acute diverticulitis. 7. Nonobstructive left nephrolithiasis measuring up to 1.3 cm. 8. 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 Electronically Signed   By: MIven FinnM.D.   On: 08/16/2021 20:41     CMarzetta Board MD, PhD [facility]  Between 7 am - 7 pm I am available, please contact me via Amion (for emergencies) or Securechat (non urgent messages)  Between 7 pm - 7 am I am not available, please contact night coverage MD/APP via Amion

## 2021-08-17 NOTE — Assessment & Plan Note (Signed)
Platelets stable at 100,000.  Denies any obvious bleeding.

## 2021-08-17 NOTE — Assessment & Plan Note (Addendum)
Denies any chest pain.  Hold rosuvastatin with transaminitis.  Not currently on antiplatelet.

## 2021-08-17 NOTE — Assessment & Plan Note (Signed)
Transaminitis with enhancement/thickening of the CBD wall on CT imaging.  Had RUQ pain without fever or leukocytosis. -Obtain MRCP -[facility] to follow

## 2021-08-17 NOTE — Progress Notes (Signed)
Pt refused cpap

## 2021-08-17 NOTE — Hospital Course (Signed)
Lonnie Hansen is a 71 y.o. male with medical history significant for chronic ITP receiving Nplate, CAD, severe AI with aortic root dilatation s/p aortic root grafting and bioprosthetic AVR, AAA, HTN, HLD, recent diverticular bleed s/p coil embolization on 05/26/2021 who is admitted with elevated LFTs for further evaluation with MRCP.

## 2021-08-17 NOTE — Assessment & Plan Note (Signed)
Continue CPAP nightly. °

## 2021-08-18 LAB — CBC
HCT: 39.1 % (ref 39.0–52.0)
Hemoglobin: 12.6 g/dL — ABNORMAL LOW (ref 13.0–17.0)
MCH: 29 pg (ref 26.0–34.0)
MCHC: 32.2 g/dL (ref 30.0–36.0)
MCV: 90.1 fL (ref 80.0–100.0)
Platelets: 109 10*3/uL — ABNORMAL LOW (ref 150–400)
RBC: 4.34 MIL/uL (ref 4.22–5.81)
RDW: 16.3 % — ABNORMAL HIGH (ref 11.5–15.5)
WBC: 10.6 10*3/uL — ABNORMAL HIGH (ref 4.0–10.5)
nRBC: 0 % (ref 0.0–0.2)

## 2021-08-18 LAB — COMPREHENSIVE METABOLIC PANEL
ALT: 316 U/L — ABNORMAL HIGH (ref 0–44)
AST: 205 U/L — ABNORMAL HIGH (ref 15–41)
Albumin: 4.1 g/dL (ref 3.5–5.0)
Alkaline Phosphatase: 230 U/L — ABNORMAL HIGH (ref 38–126)
Anion gap: 11 (ref 5–15)
BUN: 14 mg/dL (ref 8–23)
CO2: 26 mmol/L (ref 22–32)
Calcium: 9.3 mg/dL (ref 8.9–10.3)
Chloride: 99 mmol/L (ref 98–111)
Creatinine, Ser: 1.06 mg/dL (ref 0.61–1.24)
GFR, Estimated: >60 mL/min (ref >60)
Glucose, Bld: 102 mg/dL — ABNORMAL HIGH (ref 70–99)
Potassium: 3.2 mmol/L — ABNORMAL LOW (ref 3.5–5.1)
Sodium: 136 mmol/L (ref 135–145)
Total Bilirubin: 1.8 mg/dL — ABNORMAL HIGH (ref 0.3–1.2)
Total Protein: 7.8 g/dL (ref 6.5–8.1)

## 2021-08-18 NOTE — Progress Notes (Signed)
PROGRESS NOTE  Lonnie Hansen LPF:[id number] DOB: 1950-03-21 DOA: 08/16/2021 PCP: [name], MD   LOS: 1 day   Brief Narrative / Interim history: 71 year old male with chronic ITP receiving Nplate, CAD, severe AI with aortic root dilatation status post aortic root grafting and bioprosthetic AVR, AAA, HTN, HLD, recent diverticular bleed status post coil embolization May 2023 who comes into the hospital with right upper quadrant pain.  He has been followed by GI as an outpatient given rising LFTs and right upper quadrant pain, was scheduled for an MRCP as an outpatient but got sick with worsening abdominal pain and decided to come to the hospital.  He was found to have elevated LFTs and bilirubin and was admitted to the hospital.  Subjective / 24h Interval events: Felt a little bit better last night, but now feels like pain is coming back.  Assesement and Plan: Principal Problem:   Elevated LFTs Active Problems:   Chronic ITP (idiopathic thrombocytopenia) (HCC)   Hyperlipidemia   History of CAD   Hypertension   Aneurysm of infrarenal abdominal aorta (HCC)   OSA on CPAP   Choledocholithiasis   Principal problem Elevated LFTs due to choledocholithiasis, cholangitis-he has a history of cholecystectomy.  CT scan of the abdomen and pelvis did show slight enhancement and thickening of the CBD wall.  He underwent an MRCP which showed findings concerning for choledocholithiasis.  LFTs overall stable/improving.  GI consulted, patient is scheduled for an ERCP tomorrow afternoon.  He has slight elevation in his WBC and a temp of 100.1.  He has been placed on antibiotics, continue.  Active problems Infrarenal abdominal aortic aneurysm-CT scan done this admission showed increase of an infrarenal abdominal aortic aneurysm now measuring 5.3 x 4.8 cm (from 4.8 x 4.4 cm), also an increase in the size of the left internal iliac aneurysm now measuring 3.7 cm from 3.3 in the past.  Seeing Dr. Donzetta Matters  with vascular, will likely need follow-up as soon as possible after discharge  Chronic ITP-platelets are stable around 109  OSA on CPAP-continue nightly CPAP  Essential hypertension-continue amlodipine, atenolol.  Blood pressure normal this morning  History of CAD-hold statin with transaminitis, no chest pain.  Not on antiplatelets likely due to his thrombocytopenia    Scheduled Meds:  amLODipine  10 mg Oral Daily   atenolol  12.5 mg Oral BID   pantoprazole  40 mg Oral BID AC   sodium chloride flush  3 mL Intravenous Q12H   Continuous Infusions:  cefTRIAXone (ROCEPHIN)  IV 2 g (08/18/21 0902)   PRN Meds:.hydrALAZINE, morphine injection, ondansetron **OR** ondansetron (ZOFRAN) IV, oxyCODONE  Diet Orders (From admission, onward)     Start     Ordered   08/19/21 0001  Diet NPO time specified  Diet effective midnight        08/17/21 1523   08/17/21 1203  DIET SOFT Room service appropriate? Yes; Fluid consistency: Thin  Diet effective now       Question Answer Comment  Room service appropriate? Yes   Fluid consistency: Thin      08/17/21 1203            DVT prophylaxis: SCDs Start: 08/17/21 0038   Lab Results  Component Value Date   PLT 109 (L) 08/18/2021      Code Status: Full Code  Family Communication: No family at bedside  Status is: Inpatient.  ERCP tomorrow   Level of care: Telemetry  Consultants:  Gastroenterology  Objective: Vitals:  08/17/21 1200 08/17/21 2000 08/18/21 0546 08/18/21 1304  BP: (!) 173/94 107/70 120/64 121/72  Pulse: (!) 57 65 (!) 55 (!) 57  Resp: '16 18 18 20  '$ Temp: 98.1 F (36.7 C) 98.7 F (37.1 C) 100.1 F (37.8 C) 98.7 F (37.1 C)  TempSrc: Oral Oral Oral Oral  SpO2: 99% 98% 95% 99%  Weight:      Height:        Intake/Output Summary (Last 24 hours) at 08/18/2021 1316 Last data filed at 08/18/2021 0815 Gross per 24 hour  Intake 103.28 ml  Output 1150 ml  Net -1046.72 ml    Wt Readings from Last 3 Encounters:   08/16/21 91.2 kg  06/30/21 91.5 kg  06/18/21 90.7 kg    Examination:  Constitutional: NAD Eyes: lids and conjunctivae normal, no scleral icterus ENMT: mmm Neck: normal, supple Respiratory: clear to auscultation bilaterally, no wheezing, no crackles. Normal respiratory effort.  Cardiovascular: Regular rate and rhythm, no murmurs / rubs / gallops. No LE edema. Skin: no rashes Neurologic: no focal deficits, equal strength   Data Reviewed: I have independently reviewed following labs and imaging studies   CBC Recent Labs  Lab 08/13/21 1410 08/14/21 1848 08/16/21 1833 08/17/21 0548 08/18/21 0534  WBC 6.3 5.7 7.2 7.9 10.6*  HGB 12.1* 12.4* 13.3 12.9* 12.6*  HCT 36.4* 38.2* 40.5 39.9 39.1  PLT 78* 90* 100* 109* 109*  MCV 87.7 89.7 87.3 89.1 90.1  MCH 29.2 29.1 28.7 28.8 29.0  MCHC 33.2 32.5 32.8 32.3 32.2  RDW 16.4* 16.3* 16.3* 16.4* 16.3*  LYMPHSABS 0.5* 0.7 0.9  --   --   MONOABS 0.9 0.9 1.0  --   --   EOSABS 0.0 0.1 0.0  --   --   BASOSABS 0.0 0.0 0.0  --   --      Recent Labs  Lab 08/13/21 1410 08/14/21 1848 08/16/21 1833 08/16/21 1835 08/17/21 0548 08/18/21 0534  NA 135 137 138  --  138 136  K 3.1* 3.6 3.0*  --  3.9 3.2*  CL 97* 100 98  --  99 99  CO2 '26 28 24  '$ --  27 26  GLUCOSE 132* 135* 129*  --  125* 102*  BUN 26* 23 16  --  11 14  CREATININE 1.28* 1.20 1.08  --  0.84 1.06  CALCIUM 9.6 9.7 10.1  --  9.3 9.3  AST 164* 322* 397*  --  416* 205*  ALT 153* 225* 370*  --  398* 316*  ALKPHOS 162* 170* 250*  --  268* 230*  BILITOT 3.9* 2.5* 3.0*  --  4.7* 1.8*  ALBUMIN 4.6 4.3 4.8  --  4.3 4.1  MG  --   --   --   --  1.9  --   LATICACIDVEN  --   --   --  1.3  --   --      ------------------------------------------------------------------------------------------------------------------ No results for input(s): "CHOL", "HDL", "LDLCALC", "TRIG", "CHOLHDL", "LDLDIRECT" in the last 72 hours.  Lab Results  Component Value Date   HGBA1C 5.3 09/11/2016    ------------------------------------------------------------------------------------------------------------------ No results for input(s): "TSH", "T4TOTAL", "T3FREE", "THYROIDAB" in the last 72 hours.  Invalid input(s): "FREET3"  Cardiac Enzymes No results for input(s): "CKMB", "TROPONINI", "MYOGLOBIN" in the last 168 hours.  Invalid input(s): "CK" ------------------------------------------------------------------------------------------------------------------    Component Value Date/Time   BNP 138.4 (H) 10/30/2016 1420    CBG: No results for input(s): "GLUCAP" in the last 168 hours.  No results found for this or any previous visit (from the past 240 hour(s)).   Radiology Studies: No results found.   Marzetta Board, MD, PhD [facility]  Between 7 am - 7 pm I am available, please contact me via Amion (for emergencies) or Securechat (non urgent messages)  Between 7 pm - 7 am I am not available, please contact night coverage MD/APP via Amion

## 2021-08-18 NOTE — Progress Notes (Signed)
[facility] Gastroenterology Progress Note  Lonnie Hansen 71 y.o. 23-Oct-1950   Subjective: Seen this morning. Feels ok. Denies abdominal pain.  Objective: Vital signs: Vitals:   08/18/21 0546 08/18/21 1304  BP: 120/64 121/72  Pulse: (!) 55 (!) 57  Resp: 18 20  Temp: 100.1 F (37.8 C) 98.7 F (37.1 C)  SpO2: 95% 99%    Physical Exam: Gen: lethargic, elderly, no acute distress  HEENT: anicteric sclera, poor dentition CV: RRR Chest: CTA B Abd: RUQ tenderness with guarding, soft, nondistended, +BS Ext: no edema  Lab Results: Recent Labs    08/17/21 0548 08/18/21 0534  NA 138 136  K 3.9 3.2*  CL 99 99  CO2 27 26  GLUCOSE 125* 102*  BUN 11 14  CREATININE 0.84 1.06  CALCIUM 9.3 9.3  MG 1.9  --    Recent Labs    08/17/21 0548 08/18/21 0534  AST 416* 205*  ALT 398* 316*  ALKPHOS 268* 230*  BILITOT 4.7* 1.8*  PROT 8.2* 7.8  ALBUMIN 4.3 4.1   Recent Labs    08/16/21 1833 08/17/21 0548 08/18/21 0534  WBC 7.2 7.9 10.6*  NEUTROABS 5.3  --   --   HGB 13.3 12.9* 12.6*  HCT 40.5 39.9 39.1  MCV 87.3 89.1 90.1  PLT 100* 109* 109*      Assessment/Plan: Distal CBD stone without biliary dilation. LFTs improving. S/P cholecystectomy in 11/2020. Soft diet. NPO p MN. ERCP tomorrow by Dr. Watt Climes.   Lonnie Hansen 08/18/2021, 2:52 PM  Questions please call [phone] Patient ID: Lonnie Hansen, male   DOB: 08-29-1950, 71 y.o.   MRN: [id number]

## 2021-08-19 ENCOUNTER — Inpatient Hospital Stay (HOSPITAL_COMMUNITY): Payer: Medicare Other

## 2021-08-19 ENCOUNTER — Encounter (HOSPITAL_COMMUNITY): Payer: Self-pay | Admitting: Internal Medicine

## 2021-08-19 ENCOUNTER — Encounter (HOSPITAL_COMMUNITY)
Admission: EM | Disposition: A | Discharge status: Home / Self Care | Payer: Self-pay | Source: Home / Self Care | Attending: Internal Medicine

## 2021-08-19 ENCOUNTER — Inpatient Hospital Stay (HOSPITAL_COMMUNITY): Payer: Medicare Other | Admitting: Certified Registered"

## 2021-08-19 ENCOUNTER — Inpatient Hospital Stay (HOSPITAL_COMMUNITY): Payer: Medicare Other | Admitting: Certified Registered Nurse Anesthetist

## 2021-08-19 ENCOUNTER — Other Ambulatory Visit: Payer: Self-pay

## 2021-08-19 DIAGNOSIS — I2510 Atherosclerotic heart disease of native coronary artery without angina pectoris: Secondary | ICD-10-CM | POA: Diagnosis not present

## 2021-08-19 DIAGNOSIS — I10 Essential (primary) hypertension: Secondary | ICD-10-CM | POA: Diagnosis not present

## 2021-08-19 DIAGNOSIS — K922 Gastrointestinal hemorrhage, unspecified: Secondary | ICD-10-CM

## 2021-08-19 DIAGNOSIS — Z87891 Personal history of nicotine dependence: Secondary | ICD-10-CM

## 2021-08-19 DIAGNOSIS — K8050 Calculus of bile duct without cholangitis or cholecystitis without obstruction: Secondary | ICD-10-CM | POA: Diagnosis not present

## 2021-08-19 DIAGNOSIS — K838 Other specified diseases of biliary tract: Secondary | ICD-10-CM

## 2021-08-19 HISTORY — PX: SPHINCTEROTOMY: SHX5544

## 2021-08-19 HISTORY — PX: HEMOSTASIS CONTROL: SHX6838

## 2021-08-19 HISTORY — PX: ESOPHAGOGASTRODUODENOSCOPY: SHX5428

## 2021-08-19 HISTORY — PX: REMOVAL OF STONES: SHX5545

## 2021-08-19 HISTORY — PX: ERCP: SHX5425

## 2021-08-19 LAB — COMPREHENSIVE METABOLIC PANEL
ALT: 215 U/L — ABNORMAL HIGH (ref 0–44)
ALT: 232 U/L — ABNORMAL HIGH (ref 0–44)
AST: 112 U/L — ABNORMAL HIGH (ref 15–41)
AST: 223 U/L — ABNORMAL HIGH (ref 15–41)
Albumin: 3.5 g/dL (ref 3.5–5.0)
Albumin: 3.2 g/dL — ABNORMAL LOW (ref 3.5–5.0)
Alkaline Phosphatase: 213 U/L — ABNORMAL HIGH (ref 38–126)
Alkaline Phosphatase: 270 U/L — ABNORMAL HIGH (ref 38–126)
Anion gap: 11 (ref 5–15)
Anion gap: 14 (ref 5–15)
BUN: 15 mg/dL (ref 8–23)
BUN: 17 mg/dL (ref 8–23)
CO2: 24 mmol/L (ref 22–32)
CO2: 21 mmol/L — ABNORMAL LOW (ref 22–32)
Calcium: 9 mg/dL (ref 8.9–10.3)
Calcium: 8.4 mg/dL — ABNORMAL LOW (ref 8.9–10.3)
Chloride: 101 mmol/L (ref 98–111)
Chloride: 102 mmol/L (ref 98–111)
Creatinine, Ser: 0.95 mg/dL (ref 0.61–1.24)
Creatinine, Ser: 1.01 mg/dL (ref 0.61–1.24)
GFR, Estimated: >60 mL/min (ref >60)
GFR, Estimated: >60 mL/min (ref >60)
Glucose, Bld: 97 mg/dL (ref 70–99)
Glucose, Bld: 165 mg/dL — ABNORMAL HIGH (ref 70–99)
Potassium: 3.4 mmol/L — ABNORMAL LOW (ref 3.5–5.1)
Potassium: 2.8 mmol/L — ABNORMAL LOW (ref 3.5–5.1)
Sodium: 136 mmol/L (ref 135–145)
Sodium: 137 mmol/L (ref 135–145)
Total Bilirubin: 1.6 mg/dL — ABNORMAL HIGH (ref 0.3–1.2)
Total Bilirubin: 3.2 mg/dL — ABNORMAL HIGH (ref 0.3–1.2)
Total Protein: 7.2 g/dL (ref 6.5–8.1)
Total Protein: 6.5 g/dL (ref 6.5–8.1)

## 2021-08-19 LAB — CBC
HCT: 39.5 % (ref 39.0–52.0)
HCT: 33.7 % — ABNORMAL LOW (ref 39.0–52.0)
HCT: 31.9 % — ABNORMAL LOW (ref 39.0–52.0)
Hemoglobin: 11.8 g/dL — ABNORMAL LOW (ref 13.0–17.0)
Hemoglobin: 10.8 g/dL — ABNORMAL LOW (ref 13.0–17.0)
Hemoglobin: 10.8 g/dL — ABNORMAL LOW (ref 13.0–17.0)
MCH: 29.1 pg (ref 26.0–34.0)
MCH: 29.2 pg (ref 26.0–34.0)
MCH: 29.8 pg (ref 26.0–34.0)
MCHC: 29.9 g/dL — ABNORMAL LOW (ref 30.0–36.0)
MCHC: 32 g/dL (ref 30.0–36.0)
MCHC: 33.9 g/dL (ref 30.0–36.0)
MCV: 97.3 fL (ref 80.0–100.0)
MCV: 91.1 fL (ref 80.0–100.0)
MCV: 88.1 fL (ref 80.0–100.0)
Platelets: 91 10*3/uL — ABNORMAL LOW (ref 150–400)
Platelets: 129 10*3/uL — ABNORMAL LOW (ref 150–400)
Platelets: 84 10*3/uL — ABNORMAL LOW (ref 150–400)
RBC: 4.06 MIL/uL — ABNORMAL LOW (ref 4.22–5.81)
RBC: 3.7 MIL/uL — ABNORMAL LOW (ref 4.22–5.81)
RBC: 3.62 MIL/uL — ABNORMAL LOW (ref 4.22–5.81)
RDW: 16.5 % — ABNORMAL HIGH (ref 11.5–15.5)
RDW: 16.3 % — ABNORMAL HIGH (ref 11.5–15.5)
RDW: 15.4 % (ref 11.5–15.5)
WBC: 9.2 10*3/uL (ref 4.0–10.5)
WBC: 15.8 10*3/uL — ABNORMAL HIGH (ref 4.0–10.5)
WBC: 13.7 10*3/uL — ABNORMAL HIGH (ref 4.0–10.5)
nRBC: 0 % (ref 0.0–0.2)
nRBC: 0 % (ref 0.0–0.2)
nRBC: 0 % (ref 0.0–0.2)

## 2021-08-19 LAB — BLOOD GAS, ARTERIAL
Acid-base deficit: 3.3 mmol/L — ABNORMAL HIGH (ref 0.0–2.0)
Bicarbonate: 21.3 mmol/L (ref 20.0–28.0)
O2 Saturation: 97.9 %
Patient temperature: 37
pCO2 arterial: 36 mmHg (ref 32–48)
pH, Arterial: 7.38 (ref 7.35–7.45)
pO2, Arterial: 80 mmHg — ABNORMAL LOW (ref 83–108)

## 2021-08-19 LAB — GLUCOSE, CAPILLARY: Glucose-Capillary: 147 mg/dL — ABNORMAL HIGH (ref 70–99)

## 2021-08-19 LAB — PREPARE RBC (CROSSMATCH)

## 2021-08-19 LAB — TROPONIN I (HIGH SENSITIVITY)
Troponin I (High Sensitivity): 8 ng/L (ref <18)
Troponin I (High Sensitivity): 10 ng/L (ref <18)

## 2021-08-19 LAB — MRSA NEXT GEN BY PCR, NASAL: MRSA by PCR Next Gen: NOT DETECTED

## 2021-08-19 LAB — PROTIME-INR
INR: 1.1 (ref 0.8–1.2)
Prothrombin Time: 14.3 seconds (ref 11.4–15.2)

## 2021-08-19 LAB — LACTIC ACID, PLASMA: Lactic Acid, Venous: 3.6 mmol/L (ref 0.5–1.9)

## 2021-08-19 LAB — MAGNESIUM: Magnesium: 2 mg/dL (ref 1.7–2.4)

## 2021-08-19 SURGERY — ERCP, WITH INTERVENTION IF INDICATED
Anesthesia: General

## 2021-08-19 SURGERY — EGD (ESOPHAGOGASTRODUODENOSCOPY)
Anesthesia: General

## 2021-08-19 MED ORDER — ONDANSETRON HCL 4 MG/2ML IJ SOLN
INTRAMUSCULAR | Status: AC
  Filled 2021-08-19: qty 2

## 2021-08-19 MED ORDER — ROCURONIUM BROMIDE 10 MG/ML (PF) SYRINGE
PREFILLED_SYRINGE | INTRAVENOUS | Status: DC | PRN
  Administered 2021-08-19: 60 mg via INTRAVENOUS

## 2021-08-19 MED ORDER — SODIUM CHLORIDE 0.9 % IV SOLN
12.5000 mg | Freq: Once | INTRAVENOUS | Status: AC
  Administered 2021-08-19: 12.5 mg via INTRAVENOUS
  Filled 2021-08-19: qty 12.5
  Filled 2021-08-19: qty 0.5

## 2021-08-19 MED ORDER — SUGAMMADEX SODIUM 200 MG/2ML IV SOLN
INTRAVENOUS | Status: DC | PRN
  Administered 2021-08-19: 200 mg via INTRAVENOUS

## 2021-08-19 MED ORDER — POTASSIUM CHLORIDE 10 MEQ/100ML IV SOLN
10.0000 meq | INTRAVENOUS | Status: DC
  Administered 2021-08-19 – 2021-08-20 (×4): 10 meq via INTRAVENOUS
  Filled 2021-08-19 (×4): qty 100

## 2021-08-19 MED ORDER — SODIUM CHLORIDE 0.9% IV SOLUTION: Freq: Once | INTRAVENOUS | Status: DC

## 2021-08-19 MED ORDER — GLUCAGON HCL RDNA (DIAGNOSTIC) 1 MG IJ SOLR
INTRAMUSCULAR | Status: AC
  Filled 2021-08-19: qty 1

## 2021-08-19 MED ORDER — SODIUM CHLORIDE 0.9 % IV SOLN
50.0000 ug/h | INTRAVENOUS | Status: DC
  Administered 2021-08-19 – 2021-08-20 (×3): 50 ug/h via INTRAVENOUS
  Filled 2021-08-19 (×3): qty 1

## 2021-08-19 MED ORDER — PANTOPRAZOLE SODIUM 40 MG IV SOLR
40.0000 mg | Freq: Two times a day (BID) | INTRAVENOUS | Status: DC
  Administered 2021-08-20 – 2021-08-22 (×6): 40 mg via INTRAVENOUS
  Filled 2021-08-19 (×6): qty 10

## 2021-08-19 MED ORDER — IOHEXOL 300 MG/ML  SOLN
100.0000 mL | Freq: Once | INTRAMUSCULAR | Status: AC | PRN
  Administered 2021-08-19: 55 mL via INTRA_ARTERIAL

## 2021-08-19 MED ORDER — SODIUM CHLORIDE 0.9 % IV SOLN: 250.0000 mL | INTRAVENOUS | Status: DC

## 2021-08-19 MED ORDER — MIDAZOLAM HCL 2 MG/2ML IJ SOLN
INTRAMUSCULAR | Status: AC
  Filled 2021-08-19: qty 2

## 2021-08-19 MED ORDER — PROMETHAZINE HCL 25 MG/ML IJ SOLN
INTRAMUSCULAR | Status: AC
  Filled 2021-08-19: qty 1

## 2021-08-19 MED ORDER — PHENYLEPHRINE HCL (PRESSORS) 10 MG/ML IV SOLN
INTRAVENOUS | Status: AC
  Filled 2021-08-19: qty 1

## 2021-08-19 MED ORDER — PROPOFOL 10 MG/ML IV BOLUS
INTRAVENOUS | Status: DC | PRN
  Administered 2021-08-19: 100 ug/kg/min via INTRAVENOUS

## 2021-08-19 MED ORDER — SUCCINYLCHOLINE CHLORIDE 200 MG/10ML IV SOSY
PREFILLED_SYRINGE | INTRAVENOUS | Status: DC | PRN
  Administered 2021-08-19: 140 mg via INTRAVENOUS

## 2021-08-19 MED ORDER — SODIUM CHLORIDE 0.9 % IV SOLN: INTRAVENOUS | Status: DC

## 2021-08-19 MED ORDER — CHLORHEXIDINE GLUCONATE CLOTH 2 % EX PADS
6.0000 | MEDICATED_PAD | Freq: Every day | CUTANEOUS | Status: DC
  Administered 2021-08-19 – 2021-08-25 (×5): 6 via TOPICAL

## 2021-08-19 MED ORDER — EPINEPHRINE 1 MG/10ML IJ SOSY
PREFILLED_SYRINGE | INTRAMUSCULAR | Status: AC
  Filled 2021-08-19: qty 10

## 2021-08-19 MED ORDER — HYDRALAZINE HCL 20 MG/ML IJ SOLN
INTRAMUSCULAR | Status: AC
  Filled 2021-08-19: qty 1

## 2021-08-19 MED ORDER — FENTANYL CITRATE (PF) 100 MCG/2ML IJ SOLN
INTRAMUSCULAR | Status: AC
  Filled 2021-08-19: qty 2

## 2021-08-19 MED ORDER — SODIUM CHLORIDE 0.9 % IV SOLN
INTRAVENOUS | Status: DC | PRN
  Administered 2021-08-19: 30 mL

## 2021-08-19 MED ORDER — ETOMIDATE 2 MG/ML IV SOLN
INTRAVENOUS | Status: DC | PRN
  Administered 2021-08-19: 20 mg via INTRAVENOUS

## 2021-08-19 MED ORDER — NOREPINEPHRINE 4 MG/250ML-% IV SOLN
2.0000 ug/min | INTRAVENOUS | Status: DC
  Filled 2021-08-19: qty 250

## 2021-08-19 MED ORDER — LIDOCAINE 2% (20 MG/ML) 5 ML SYRINGE
INTRAMUSCULAR | Status: DC | PRN
  Administered 2021-08-19: 60 mg via INTRAVENOUS

## 2021-08-19 MED ORDER — LIDOCAINE HCL 1 % IJ SOLN
INTRAMUSCULAR | Status: AC
  Filled 2021-08-19: qty 20

## 2021-08-19 MED ORDER — AMISULPRIDE (ANTIEMETIC) 5 MG/2ML IV SOLN
10.0000 mg | Freq: Once | INTRAVENOUS | Status: AC
  Administered 2021-08-19: 10 mg via INTRAVENOUS
  Filled 2021-08-19: qty 4

## 2021-08-19 MED ORDER — PHENYLEPHRINE HCL-NACL 20-0.9 MG/250ML-% IV SOLN
INTRAVENOUS | Status: DC | PRN
  Administered 2021-08-19: 50 ug/min via INTRAVENOUS

## 2021-08-19 MED ORDER — MIDAZOLAM HCL 2 MG/2ML IJ SOLN
INTRAMUSCULAR | Status: DC | PRN
  Administered 2021-08-19: 2 mg via INTRAVENOUS

## 2021-08-19 MED ORDER — FENTANYL CITRATE (PF) 100 MCG/2ML IJ SOLN
INTRAMUSCULAR | Status: DC | PRN
  Administered 2021-08-19 (×2): 50 ug via INTRAVENOUS

## 2021-08-19 MED ORDER — SODIUM CHLORIDE (PF) 0.9 % IJ SOLN
PREFILLED_SYRINGE | INTRAMUSCULAR | Status: DC | PRN
  Administered 2021-08-19: 2.5 mL

## 2021-08-19 MED ORDER — PROPOFOL 10 MG/ML IV BOLUS
INTRAVENOUS | Status: AC
  Filled 2021-08-19: qty 20

## 2021-08-19 MED ORDER — LACTATED RINGERS IV SOLN: INTRAVENOUS | Status: DC | PRN

## 2021-08-19 MED ORDER — GELATIN ABSORBABLE 12-7 MM EX MISC
CUTANEOUS | Status: AC | PRN
  Administered 2021-08-19: 1

## 2021-08-19 MED ORDER — PROMETHAZINE HCL 25 MG/ML IJ SOLN
INTRAMUSCULAR | Status: DC | PRN
  Administered 2021-08-19: 12.5 mg via INTRAVENOUS

## 2021-08-19 MED ORDER — ROCURONIUM BROMIDE 10 MG/ML (PF) SYRINGE
PREFILLED_SYRINGE | INTRAVENOUS | Status: DC | PRN
  Administered 2021-08-19: 100 mg via INTRAVENOUS
  Administered 2021-08-19: 80 mg via INTRAVENOUS

## 2021-08-19 MED ORDER — ORAL CARE MOUTH RINSE: 15.0000 mL | OROMUCOSAL | Status: DC | PRN

## 2021-08-19 MED ORDER — GELATIN ABSORBABLE 12-7 MM EX MISC
CUTANEOUS | Status: AC
  Filled 2021-08-19: qty 1

## 2021-08-19 MED ORDER — PHENYLEPHRINE 80 MCG/ML (10ML) SYRINGE FOR IV PUSH (FOR BLOOD PRESSURE SUPPORT)
PREFILLED_SYRINGE | INTRAVENOUS | Status: DC | PRN
  Administered 2021-08-19: 120 ug via INTRAVENOUS
  Administered 2021-08-19: 80 ug via INTRAVENOUS

## 2021-08-19 MED ORDER — CIPROFLOXACIN IN D5W 400 MG/200ML IV SOLN
INTRAVENOUS | Status: AC
  Filled 2021-08-19: qty 200

## 2021-08-19 MED ORDER — SUCCINYLCHOLINE CHLORIDE 200 MG/10ML IV SOSY
PREFILLED_SYRINGE | INTRAVENOUS | Status: DC | PRN
  Administered 2021-08-19: 120 mg via INTRAVENOUS

## 2021-08-19 MED ORDER — LACTATED RINGERS IV SOLN
INTRAVENOUS | Status: DC
  Administered 2021-08-19: 1000 mL via INTRAVENOUS

## 2021-08-19 MED ORDER — PROPOFOL 10 MG/ML IV BOLUS
INTRAVENOUS | Status: DC | PRN
  Administered 2021-08-19: 140 mg via INTRAVENOUS

## 2021-08-19 MED ORDER — DICLOFENAC SUPPOSITORY 100 MG
RECTAL | Status: AC
  Filled 2021-08-19: qty 1

## 2021-08-19 MED ORDER — PROPOFOL 1000 MG/100ML IV EMUL
5.0000 ug/kg/min | INTRAVENOUS | Status: DC
  Administered 2021-08-20: 75 ug/kg/min via INTRAVENOUS
  Administered 2021-08-20: 50 ug/kg/min via INTRAVENOUS
  Filled 2021-08-19 (×3): qty 100

## 2021-08-19 MED ORDER — HYDRALAZINE HCL 20 MG/ML IJ SOLN
5.0000 mg | Freq: Once | INTRAMUSCULAR | Status: AC
  Administered 2021-08-19: 5 mg via INTRAVENOUS

## 2021-08-19 MED ORDER — ONDANSETRON HCL 4 MG/2ML IJ SOLN
INTRAMUSCULAR | Status: DC | PRN
  Administered 2021-08-19: 4 mg via INTRAVENOUS

## 2021-08-19 MED ORDER — IOHEXOL 350 MG/ML SOLN
100.0000 mL | Freq: Once | INTRAVENOUS | Status: AC | PRN
  Administered 2021-08-19: 100 mL via INTRAVENOUS

## 2021-08-19 NOTE — Anesthesia Preprocedure Evaluation (Addendum)
Anesthesia Evaluation  Patient identified by MRN, date of birth, ID band Patient awake    Reviewed: Allergy & Precautions, H&P , NPO status , Patient's Chart, lab work & pertinent test resultsPreop documentation limited or incomplete due to emergent nature of procedure.  Airway Mallampati: II   Neck ROM: full    Dental   Pulmonary sleep apnea , former smoker,    breath sounds clear to auscultation       Cardiovascular hypertension, + CAD   Rhythm:regular Rate:Normal     Neuro/Psych  Headaches,    GI/Hepatic GERD  ,Pt has upper GI bleed s/p ERCP today with sphincterotomy.     Endo/Other    Renal/GU      Musculoskeletal  (+) Arthritis ,   Abdominal   Peds  Hematology  (+) Blood dyscrasia, anemia ,   Anesthesia Other Findings   Reproductive/Obstetrics                            Anesthesia Physical Anesthesia Plan  ASA: 3 and emergent  Anesthesia Plan: General   Post-op Pain Management:    Induction: Intravenous, Rapid sequence and Cricoid pressure planned  PONV Risk Score and Plan: 2 and Ondansetron, Dexamethasone and Treatment may vary due to age or medical condition  Airway Management Planned: Oral ETT  Additional Equipment:   Intra-op Plan:   Post-operative Plan: Extubation in OR and Possible Post-op intubation/ventilation  Informed Consent: I have reviewed the patients History and Physical, chart, labs and discussed the procedure including the risks, benefits and alternatives for the proposed anesthesia with the patient or authorized representative who has indicated his/her understanding and acceptance.     Dental advisory given  Plan Discussed with: CRNA, Anesthesiologist and Surgeon  Anesthesia Plan Comments: (Pt is hypotensive upon arrival to endoscopy suite.  At 1551 today Hgb was 10.8.  Active bleeding has be occurring since this time. 1 unit PRBC was given.  It is now  2122 and 2nd unit is now infusing.  Plan to continue resusitation at a more aggressive rate as the patient is clearly volume underresuscitated.  STAT CBC is drawn and ordered.)       Anesthesia Quick Evaluation

## 2021-08-19 NOTE — Brief Op Note (Signed)
08/16/2021 - 08/19/2021  10:21 PM  PATIENT:  Lonnie Hansen  71 y.o. male  PRE-OPERATIVE DIAGNOSIS:  GI Bleed  POST-OPERATIVE DIAGNOSIS:  * No post-op diagnosis entered *  PROCEDURE:  Procedure(s): ESOPHAGOGASTRODUODENOSCOPY (EGD) (N/A)  SURGEON:  Surgeon(s) and Role:    * Harmon Bommarito, MD - Primary  Findings ---------- -Active bleeding noted at sphincterotomy site.  It appeared that large amount of blood was coming out from common bile duct.  Dr. Earleen Newport from interventional radiology was also present during the procedure.  2.5 cc epinephrine was injected around sphincterotomy site without achieving hemostasis.  Patient will go straight to IR for embolization of GDA.  -Patient received 3 units of blood transfusion during endoscopy.  -Attempted to call patient's wife but went to voicemail.  Otis Brace MD, [place] 08/19/2021, 10:21 PM  Contact #  [phone]

## 2021-08-19 NOTE — Progress Notes (Signed)
  Transition of Care [facility]) Screening Note   Patient Details  Name: Lonnie Hansen Date of Birth: December 25, 1950   Transition of Care [facility]) CM/SW Contact:    Vassie Moselle, LCSW Phone Number: 08/19/2021, 11:20 AM    Transition of Care Department [facility]) has reviewed patient and no TOC needs have been identified at this time. We will continue to monitor patient advancement through interdisciplinary progression rounds. If new patient transition needs arise, please place a TOC consult.

## 2021-08-19 NOTE — Addendum Note (Signed)
Addendum  created 08/19/21 1542 by Josephine Igo, MD   Clinical Note Signed

## 2021-08-19 NOTE — Consult Note (Signed)
NAME:  Lonnie Hansen, MRN:  [id number], DOB:  [birth year], LOS: 2 ADMISSION DATE:  08/16/2021, CONSULTATION DATE:  7/25 REFERRING MD:  Dr. 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 As part of routine lab work he was discovered to have elevated LFT and was being followed with plans for MRCP, however, in the interim he developed RUQ abdominal pain, nausea, and vomiting. CT scan in the ED showed thickening of the CBD wall and increased size AAA. Underwent MRCP which was concerning for choledocholithiasis. GI was consulted and performed ERCP on 7/25, which was initially uncomplicated, however, in the post-procedural setting the patient developed hematemesis and hematochezia. Subsequently he became diaphoretic and less responsive with ectopy on telemetry and was transferred to the ICU. PCCM was consulted for further evaluation.  Pertinent  Medical History   has a past medical history of AAA (abdominal aortic aneurysm) ([place]), Anemia, Arthritis, Asymptomatic bilateral carotid artery stenosis (08/2015), Cataracts, bilateral, Chronic ITP (idiopathic thrombocytopenia) ([place]) (03/31/2018), Coronary artery disease, Diverticulosis, Enlarged aorta ([place]), Enlarged prostate, GERD (gastroesophageal reflux disease), Glaucoma, Headache, History of colon polyps, History of kidney stones, Hyperlipidemia, Hypertension, OSA on CPAP, and Vocal cord nodule.   Significant Hospital Events: Including procedures, antibiotic start and stop dates in addition to other pertinent events     Interim History / Subjective:    Objective   Blood pressure 97/60, pulse 64, temperature (!) 97.1 F (36.2 C), temperature source Axillary, resp. rate (!) 24, height '5\' 10"'$  (1.778 m), weight 85.3 kg, SpO2 100 %.        Intake/Output Summary (Last 24 hours) at 08/19/2021 1608 Last data filed at  08/19/2021 1325 Gross per 24 hour  Intake 940 ml  Output 955 ml  Net -15 ml   Filed Weights   08/16/21 1828 08/19/21 1158  Weight: 91.2 kg 85.3 kg    Examination: General: elderly appearing male in NAD HENT: [place]/AT, PERRL, no JVD Lungs: Clear bilateral breath sounds Cardiovascular: RRR, no MRG. Frequent PVC and at least one brief run of NSVT Abdomen: Soft, non-tender, non-distended Extremities: Cool clammy no acute deformity Neuro: Somnolent, but arouses to voice. alert and oriented.   Resolved Hospital Problem list     Assessment & Plan:   Acute gastrointestinal hemorrhage: occurred in the postprocedural setting following ERCP - Close ICU monitoring - will start Protonix BID and octreotide for now - Check CBC, Coags - Transfuse one unit PRBC while labs pending.  - Patient has known antibody so will await matching, however, with any deterioration at all we will pre-treat and transfuse.  - hx ITP transfuse for Plt < 50k.  - GI following  Choledocholithiasis, cholangitis: by CT and MRCP. S/p ERCP 7/25. Hx of cholecystectomy. - GI following - Trend LFT  Hypotension: transient. Worse with valsalva when he attempts to have a bowel movement - Transfuse blood once matched.  - give a small fluid bolus for now - If hypotension sustains we will emergently transfuse as above.   ITP chronic: platelets 91 pre GIB. - trend CBC - Transfuse for Plt < 50k  Infrarenal abdominal aortic aneurysm. Larger on CT than previously known now up to 5.3 x 4.8 cm (from 4.8 x 4.4 cm). Left internal iliac aneurysm larger now as well.  - Followed by Dr. Donzetta Matters in the outpatient setting.   OSA on CPAP -  defer CPAP for tonight. Can re-evaluate once more stable.   Best Practice (right click and "Reselect all SmartList Selections" daily)   Diet/type: NPO DVT prophylaxis: not indicated GI prophylaxis: PPI Lines: N/A Foley:  N/A Code Status:  full code Last date of multidisciplinary goals of care  discussion '[ ]'$   Labs   CBC: Recent Labs  Lab 08/13/21 1410 08/14/21 1848 08/16/21 1833 08/17/21 0548 08/18/21 0534 08/19/21 0521  WBC 6.3 5.7 7.2 7.9 10.6* 9.2  NEUTROABS 4.9 4.0 5.3  --   --   --   HGB 12.1* 12.4* 13.3 12.9* 12.6* 11.8*  HCT 36.4* 38.2* 40.5 39.9 39.1 39.5  MCV 87.7 89.7 87.3 89.1 90.1 97.3  PLT 78* 90* 100* 109* 109* 91*    Basic Metabolic Panel: Recent Labs  Lab 08/14/21 1848 08/16/21 1833 08/17/21 0548 08/18/21 0534 08/19/21 0521  NA 137 138 138 136 136  K 3.6 3.0* 3.9 3.2* 3.4*  CL 100 98 99 99 101  CO2 '28 24 27 26 24  '$ GLUCOSE 135* 129* 125* 102* 97  BUN '23 16 11 14 15  '$ CREATININE 1.20 1.08 0.84 1.06 0.95  CALCIUM 9.7 10.1 9.3 9.3 9.0  MG  --   --  1.9  --  2.0   GFR: Estimated Creatinine Clearance: 73.6 mL/min (by C-G formula based on SCr of 0.95 mg/dL). Recent Labs  Lab 08/16/21 1833 08/16/21 1835 08/17/21 0548 08/18/21 0534 08/19/21 0521  WBC 7.2  --  7.9 10.6* 9.2  LATICACIDVEN  --  1.3  --   --   --     Liver Function Tests: Recent Labs  Lab 08/14/21 1848 08/16/21 1833 08/17/21 0548 08/18/21 0534 08/19/21 0521  AST 322* 397* 416* 205* 112*  ALT 225* 370* 398* 316* 215*  ALKPHOS 170* 250* 268* 230* 213*  BILITOT 2.5* 3.0* 4.7* 1.8* 1.6*  PROT 8.0 7.9 8.2* 7.8 7.2  ALBUMIN 4.3 4.8 4.3 4.1 3.5   Recent Labs  Lab 08/14/21 1848 08/16/21 1833  LIPASE 66* 68*   No results for input(s): "AMMONIA" in the last 168 hours.  ABG    Component Value Date/Time   PHART 7.283 (L) 05/25/2020 0217   PCO2ART 41.8 05/25/2020 0217   PO2ART 399 (H) 05/25/2020 0217   HCO3 19.9 (L) 05/25/2020 0217   TCO2 23 05/26/2021 0657   ACIDBASEDEF 7.0 (H) 05/25/2020 0217   O2SAT 100.0 05/25/2020 0217     Coagulation Profile: No results for input(s): "INR", "PROTIME" in the last 168 hours.  Cardiac Enzymes: No results for input(s): "CKTOTAL", "CKMB", "CKMBINDEX", "TROPONINI" in the last 168 hours.  HbA1C: Hgb A1c MFr Bld  Date/Time  Value Ref Range Status  09/11/2016 10:48 AM 5.3 4.8 - 5.6 % Final    Comment:    (NOTE) Pre diabetes:          5.7%-6.4% Diabetes:              >6.4% Glycemic control for   <7.0% adults with diabetes   04/28/2010 07:03 AM  <5.7 % Final   5.6 (NOTE)                                                                       According to the  ADA Clinical Practice Recommendations for 2011, when HbA1c is used as a screening test:   >=6.5%   Diagnostic of Diabetes Mellitus           (if abnormal result  is confirmed)  5.7-6.4%   Increased risk of developing Diabetes Mellitus  References:Diagnosis and Classification of Diabetes Mellitus,Diabetes DJME,2683,41(DQQIW 1):S62-S69 and Standards of Medical Care in         Diabetes - 2011,Diabetes LNLG,9211,94  (Suppl 1):S11-S61.    CBG: Recent Labs  Lab 08/19/21 1543  GLUCAP 147*    Review of Systems:   Patient is encephalopathic and/or intubated. Therefore history has been obtained from chart review.    Past Medical History:  He,  has a past medical history of AAA (abdominal aortic aneurysm) ([place]), Anemia, Arthritis, Asymptomatic bilateral carotid artery stenosis (08/2015), Cataracts, bilateral, Chronic ITP (idiopathic thrombocytopenia) ([place]) (03/31/2018), Coronary artery disease, Diverticulosis, Enlarged aorta ([place]), Enlarged prostate, GERD (gastroesophageal reflux disease), Glaucoma, Headache, History of colon polyps, History of kidney stones, Hyperlipidemia, Hypertension, OSA on CPAP, and Vocal cord nodule.   Surgical History:   Past Surgical History:  Procedure Laterality Date   AORTIC ARCH ANGIOGRAPHY N/A 04/16/2016   Procedure: Aortic Arch Angiography;  Surgeon: [name] Martinique, MD;  Location: [facility];  Service: Cardiovascular;  Laterality: N/A;   AORTIC VALVE REPLACEMENT N/A 09/15/2016   Procedure: AORTIC VALVE REPLACEMENT (AVR);  Surgeon: Grace Isaac, MD;  Location: [facility];  Service: Open Heart Surgery;  Laterality: N/A;  Using  44m Edwards Perimount Magna Ease Aortic Bioprosthesis Valve   ASCENDING AORTIC ROOT REPLACEMENT N/A 09/15/2016   Procedure: ASCENDING AORTIC ROOT REPLACEMENT;  Surgeon: GGrace Isaac MD;  [facility]  Service: Open Heart Surgery;  Laterality: N/A;  Using 364mGelweave Valsalva Graft   CHOLECYSTECTOMY N/A 12/19/2020   Procedure: LAPAROSCOPIC CHOLECYSTECTOMY WITH POSSIBLE INTRAOPERATIVE CHOLANGIOGRAM;  Surgeon: WiGreer PickerelMD;  [facility] Service: General;  Laterality: N/A;   COLONOSCOPY     COLONOSCOPY Left 04/16/2019   Procedure: COLONOSCOPY;  Surgeon: OuArta SilenceMD;  [facility];  Service: Endoscopy;  Laterality: Left;   COLONOSCOPY N/A 06/01/2020   Procedure: COLONOSCOPY;  Surgeon: KaRonnette JuniperMD;  [facility] Service: Gastroenterology;  Laterality: N/A;   COLONOSCOPY WITH ESOPHAGOGASTRODUODENOSCOPY (EGD)     COLONOSCOPY WITH PROPOFOL N/A 05/14/2021   Procedure: COLONOSCOPY WITH PROPOFOL;  Surgeon: KaRonnette JuniperMD;  [facility];  Service: Gastroenterology;  Laterality: N/A;   ESOPHAGOGASTRODUODENOSCOPY N/A 05/25/2020   Procedure: ESOPHAGOGASTRODUODENOSCOPY (EGD);  Surgeon: ScWilford CornerMD;  [facility] Service: [facility]oscopy;  Laterality: N/A;   ESOPHAGOGASTRODUODENOSCOPY (EGD) WITH PROPOFOL Left 04/16/2019   Procedure: ESOPHAGOGASTRODUODENOSCOPY (EGD) WITH PROPOFOL;  Surgeon: OuArta SilenceMD;  [facility];  Service: Endoscopy;  Laterality: Left;   FLEXIBLE SIGMOIDOSCOPY N/A 05/25/2020   Procedure: FLEXIBLE SIGMOIDOSCOPY;  Surgeon: ScWilford CornerMD;  [facility] Service: Endoscopy;  Laterality: N/A;   IR ANGIOGRAM SELECTIVE EACH ADDITIONAL VESSEL  05/26/2021   IR ANGIOGRAM VISCERAL SELECTIVE  05/26/2021   IR EMBO ART  VEN HEMORR LYMPH EXTRAV  INC GUIDE ROADMAPPING  05/26/2021   IR THORACENTESIS ASP PLEURAL SPACE W/IMG GUIDE  10/23/2016   IR US[place]oreaUIDE VASC ACCESS LEFT  05/26/2021   IR US[place]IDE VASC  ACCESS RIGHT  05/26/2021   MICROLARYNGOSCOPY Right 09/20/2017   Procedure: MICROLARYNGOSCOPY WITH  EXCISION OF VOCAL CORD LESION;  Surgeon: TeLeta BaptistMD;  [facility]  Service: ENT;  Laterality: Right;   MULTIPLE EXTRACTIONS WITH ALVEOLOPLASTY N/A 06/10/2016   Procedure: Extraction of tooth #'s 2,8,13,15, and 29  with alveoloplasty, maxillary right and left buccal exostoses reductions, and gross debridement of remaining teeth.;  Surgeon: Lenn Cal, DDS;  Location: [facility];  Service: Oral Surgery;  Laterality: N/A;   POLYPECTOMY  05/14/2021   Procedure: POLYPECTOMY;  Surgeon: Ronnette Juniper, MD;  [facility];  Service: Gastroenterology;;   RIGHT/LEFT HEART CATH AND CORONARY ANGIOGRAPHY N/A 04/16/2016   Procedure: Right/Left Heart Cath and Coronary Angiography;  Surgeon: [name] Martinique, MD;  Location: [facility];  Service: Cardiovascular;  Laterality: N/A;   TEE WITHOUT CARDIOVERSION N/A 09/15/2016   Procedure: TRANSESOPHAGEAL ECHOCARDIOGRAM (TEE);  Surgeon: Grace Isaac, MD;  Location: [facility];  Service: Open Heart Surgery;  Laterality: N/A;     Social History:   reports that he has quit smoking. His smoking use included cigarettes. He has a 25.00 pack-year smoking history. He has never used smokeless tobacco. He reports that he does not drink alcohol and does not use drugs.   Family History:  His family history includes Heart attack in his father; Heart disease in his father; Thyroid disease in his sister.   Allergies No Known Allergies   Home Medications  Prior to Admission medications   Medication Sig Start Date End Date Taking? Authorizing Provider  acetaminophen (TYLENOL) 500 MG tablet Take 1,000 mg by mouth daily as needed (pain).   Yes [provider]  amLODipine (NORVASC) 10 MG tablet Take 10 mg by mouth daily. 06/24/21  Yes [provider]  atenolol (TENORMIN) 25 MG tablet Take 12.5 mg by mouth 2 (two) times daily.  08/26/20  Yes [name], MD  fluticasone [facility]) 50 MCG/ACT nasal spray Place 2 sprays into both nostrils daily as needed for allergies or rhinitis.   Yes [provider]  latanoprost (XALATAN) 0.005 % ophthalmic solution Place 1 drop into both eyes 2 (two) times daily.   Yes [provider]  pantoprazole (PROTONIX) 40 MG tablet Take 40 mg by mouth 2 (two) times daily. 11/09/20  Yes [provider]  polyethylene glycol (MIRALAX / GLYCOLAX) 17 g packet Take 17 g by mouth daily. Patient taking differently: Take 17 g by mouth daily as needed for mild constipation. Every other day 06/04/20  Yes [name], MD  psyllium (HYDROCIL/METAMUCIL) 95 % PACK Take 1 packet by mouth daily. Patient taking differently: Take 1 packet by mouth daily as needed for mild constipation. 06/04/20  Yes [name], MD  rosuvastatin (CRESTOR) 20 MG tablet Take 20 mg by mouth daily.   Yes [provider]  triamcinolone cream (KENALOG) 0.1 % Apply 1 application. topically 3 (three) times daily as needed (facial itching/rash). 05/16/20  Yes [provider]  ferrous sulfate 325 (65 FE) MG tablet Take 325 mg by mouth daily. Patient not taking: Reported on 07/30/2021    [provider]  Multiple Vitamin (MULTIVITAMIN WITH MINERALS) TABS tablet Take 1 tablet by mouth daily. Patient not taking: Reported on 08/17/2021    [provider]     Critical care time: 56 minutes     Georgann Housekeeper, AGACNP-BC [place] for personal pager PCCM on call pager [phone] until 7pm. Please call Elink 7p-7a. [phone]  08/19/2021 4:56 PM

## 2021-08-19 NOTE — Procedures (Addendum)
Interventional Radiology Procedure Note  Procedure:   US guided right CFV triple lumen CVC US guided access right CFA Mesenteric angiogram Empiric embolization of replaced right hepatic artery and any contributing inferior pancreaticoduodenal arteries Empiric embolization of GDA, with visualization of active extrav from superior pancreaticoduodenal arteries before embo.   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  VIR can remove when not needed - Right CFV triple lumen ready to use - routine catheter care, with frequent NV checks - serial H&H - VIR to follow - expect ongoing hematochezia 24-48 hours as the existing luminal blood clears  Signed,  Dulcy Fanny. Earleen Newport, DO

## 2021-08-19 NOTE — Anesthesia Procedure Notes (Signed)
Procedure Name: Intubation Date/Time: 08/19/2021 12:42 PM  Performed by: Claudia Desanctis, CRNAPre-anesthesia Checklist: Patient identified, Emergency Drugs available, Suction available and Patient being monitored Patient Re-evaluated:Patient Re-evaluated prior to induction Oxygen Delivery Method: Circle system utilized Preoxygenation: Pre-oxygenation with 100% oxygen Induction Type: IV induction Ventilation: Mask ventilation without difficulty Laryngoscope Size: Miller and 3 Grade View: Grade I Tube type: Oral Number of attempts: 1 Airway Equipment and Method: Stylet Placement Confirmation: ETT inserted through vocal cords under direct vision, positive ETCO2 and breath sounds checked- equal and bilateral Secured at: 22 cm Tube secured with: Tape Dental Injury: Teeth and Oropharynx as per pre-operative assessment

## 2021-08-19 NOTE — Progress Notes (Signed)
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  He has been followed by GI as an outpatient given rising LFTs and right upper quadrant pain, was scheduled for an MRCP as an outpatient but got sick with worsening abdominal pain and decided to come to the hospital.  He was found to have elevated LFTs and bilirubin and was admitted to the hospital.  Subjective / 24h Interval events: Feels well.  Not much abdominal pain today, no nausea  Assesement and Plan: Principal Problem:   Elevated LFTs Active Problems:   Chronic ITP (idiopathic thrombocytopenia) (HCC)   Hyperlipidemia   History of CAD   Hypertension   Aneurysm of infrarenal abdominal aorta (HCC)   OSA on CPAP   Choledocholithiasis   Principal problem Elevated LFTs due to choledocholithiasis, cholangitis-he has a history of cholecystectomy.  CT scan of the abdomen and pelvis did show slight enhancement and thickening of the CBD wall.  He underwent an MRCP which showed findings concerning for choledocholithiasis.  LFTs overall stable/improving.  GI consulted, patient is scheduled for an ERCP this afternoon.  If no complications could potentially go home tomorrow  Active problems Infrarenal abdominal aortic aneurysm-CT scan done this admission showed increase of an infrarenal abdominal aortic aneurysm now measuring 5.3 x 4.8 cm (from 4.8 x 4.4 cm), also an increase in the size of the left internal iliac aneurysm now measuring 3.7 cm from 3.3 in the past.  Seeing Dr. Donzetta Matters with vascular, will likely need follow-up as soon as possible after  discharge  Chronic ITP-platelets are overall stable  OSA on CPAP-continue nightly CPAP  Essential hypertension-continue amlodipine, atenolol.  Blood pressure normal this morning  History of CAD-hold statin with transaminitis, no chest pain.  Not on antiplatelets likely due to his thrombocytopenia    Scheduled Meds:  amLODipine  10 mg Oral Daily   atenolol  12.5 mg Oral BID   pantoprazole  40 mg Oral BID AC   sodium chloride flush  3 mL Intravenous Q12H   Continuous Infusions:  cefTRIAXone (ROCEPHIN)  IV 2 g (08/19/21 0848)   PRN Meds:.hydrALAZINE, morphine injection, ondansetron **OR** ondansetron (ZOFRAN) IV, oxyCODONE  Diet Orders (From admission, onward)     Start     Ordered   08/19/21 0001  Diet NPO time specified  Diet effective midnight        08/17/21 1523            DVT prophylaxis: SCDs Start: 08/17/21 0038   Lab Results  Component Value Date   PLT 91 (L) 08/19/2021      Code Status: Full Code  Family Communication: No family at bedside  Status is: Inpatient.  ERCP today   Level of care: Telemetry  Consultants:  Gastroenterology  Objective: Vitals:   08/17/21 2000 08/18/21 0546 08/18/21 1304 08/19/21 0551  BP: 107/70 120/64 121/72 (!) 102/54  Pulse: 65 (!) 55 (!) 57 (!) 56  Resp: '18 18 20 18  '$ Temp: 98.7 F (37.1 C) 100.1 F (37.8 C) 98.7 F (37.1 C) 99.1 F (37.3 C)  TempSrc: Oral Oral Oral Oral  SpO2: 98% 95% 99% 95%  Weight:      Height:        Intake/Output Summary (Last 24 hours) at 08/19/2021 1039 Last data filed at 08/19/2021 0900 Gross per 24 hour  Intake 480 ml  Output 1100 ml  Net -620 ml    Wt Readings from Last 3 Encounters:  08/16/21 91.2 kg  06/30/21 91.5 kg  06/18/21 90.7 kg    Examination:  Constitutional: NAD Eyes: lids and conjunctivae normal, no scleral icterus ENMT: mmm Neck: normal, supple Respiratory: clear to auscultation bilaterally, no wheezing, no crackles. Normal respiratory effort.   Cardiovascular: Regular rate and rhythm, no murmurs / rubs / gallops.  Abdomen: soft, no distention, no tenderness. Bowel sounds positive.  Skin: no rashes Neurologic: no focal deficits, equal strength   Data Reviewed: I have independently reviewed following labs and imaging studies   CBC Recent Labs  Lab 08/13/21 1410 08/14/21 1848 08/16/21 1833 08/17/21 0548 08/18/21 0534 08/19/21 0521  WBC 6.3 5.7 7.2 7.9 10.6* 9.2  HGB 12.1* 12.4* 13.3 12.9* 12.6* 11.8*  HCT 36.4* 38.2* 40.5 39.9 39.1 39.5  PLT 78* 90* 100* 109* 109* 91*  MCV 87.7 89.7 87.3 89.1 90.1 97.3  MCH 29.2 29.1 28.7 28.8 29.0 29.1  MCHC 33.2 32.5 32.8 32.3 32.2 29.9*  RDW 16.4* 16.3* 16.3* 16.4* 16.3* 16.5*  LYMPHSABS 0.5* 0.7 0.9  --   --   --   MONOABS 0.9 0.9 1.0  --   --   --   EOSABS 0.0 0.1 0.0  --   --   --   BASOSABS 0.0 0.0 0.0  --   --   --      Recent Labs  Lab 08/14/21 1848 08/16/21 1833 08/16/21 1835 08/17/21 0548 08/18/21 0534 08/19/21 0521  NA 137 138  --  138 136 136  K 3.6 3.0*  --  3.9 3.2* 3.4*  CL 100 98  --  99 99 101  CO2 28 24  --  '27 26 24  '$ GLUCOSE 135* 129*  --  125* 102* 97  BUN 23 16  --  '11 14 15  '$ CREATININE 1.20 1.08  --  0.84 1.06 0.95  CALCIUM 9.7 10.1  --  9.3 9.3 9.0  AST 322* 397*  --  416* 205* 112*  ALT 225* 370*  --  398* 316* 215*  ALKPHOS 170* 250*  --  268* 230* 213*  BILITOT 2.5* 3.0*  --  4.7* 1.8* 1.6*  ALBUMIN 4.3 4.8  --  4.3 4.1 3.5  MG  --   --   --  1.9  --  2.0  LATICACIDVEN  --   --  1.3  --   --   --      ------------------------------------------------------------------------------------------------------------------ No results for input(s): "CHOL", "HDL", "LDLCALC", "TRIG", "CHOLHDL", "LDLDIRECT" in the last 72 hours.  Lab Results  Component Value Date   HGBA1C 5.3 09/11/2016   ------------------------------------------------------------------------------------------------------------------ No results for input(s): "TSH", "T4TOTAL",  "T3FREE", "THYROIDAB" in the last 72 hours.  Invalid input(s): "FREET3"  Cardiac Enzymes No results for input(s): "CKMB", "TROPONINI", "MYOGLOBIN" in the last 168 hours.  Invalid input(s): "CK" ------------------------------------------------------------------------------------------------------------------    Component Value Date/Time   BNP 138.4 (H) 10/30/2016 1420    CBG: No results for input(s): "GLUCAP" in the last 168 hours.  No results found for this or any previous visit (from the past 240 hour(s)).   Radiology Studies: No results found.   Marzetta Board, MD, PhD [facility]  Between 7 am -  7 pm I am available, please contact me via Amion (for emergencies) or Securechat (non urgent messages)  Between 7 pm - 7 am I am not available, please contact night coverage MD/APP via Amion

## 2021-08-19 NOTE — Progress Notes (Signed)
Pt refused CPAP

## 2021-08-19 NOTE — Progress Notes (Signed)
[name] 5:20 PM  Subjective: Patient doing better with only minimal right rib cage pain and some nausea but no new complaints  Objective: Vital signs stable afebrile minimal fairly fresh blood in his NG abdomen is soft nontender as it was at 3:00 in endoscopy repeat labs pending Assessment: Status post ERCP with some sphincterotomy bleeding but hematemesis may just be from scope trauma as well  Plan: Appreciate critical care help and our rounding team will check on tomorrow and please call us this evening if signs of increased bleeding or other GI question or problem otherwise he requests ice chips which I think are fine  [facility]  office [phone] After 5PM or if no answer call [phone]

## 2021-08-19 NOTE — Progress Notes (Signed)
An USGPIV (ultrasound guided PIV) 18 G x 1.88" on LFA has been placed for short-term vasopressor infusion. A correctly placed ivWatch must be used when administering Vasopressors. Should this treatment be needed beyond 72 hours, central line access should be obtained.  It will be the responsibility of the bedside nurse to follow best practice to prevent extravasations.

## 2021-08-19 NOTE — Anesthesia Procedure Notes (Addendum)
Procedure Name: Intubation Date/Time: 08/19/2021 9:42 PM  Performed by: Cleda Daub, CRNAPre-anesthesia Checklist: Patient identified, Emergency Drugs available, Suction available and Patient being monitored Patient Re-evaluated:Patient Re-evaluated prior to induction Oxygen Delivery Method: Circle system utilized Preoxygenation: Pre-oxygenation with 100% oxygen Induction Type: IV induction, Rapid sequence and Cricoid Pressure applied Laryngoscope Size: Mac and 4 Grade View: Grade I Tube type: Oral Tube size: 7.5 mm Number of attempts: 1 Airway Equipment and Method: Stylet and Oral airway Placement Confirmation: ETT inserted through vocal cords under direct vision, positive ETCO2 and breath sounds checked- equal and bilateral Secured at: 22 cm Tube secured with: Tape Dental Injury: Teeth and Oropharynx as per pre-operative assessment

## 2021-08-19 NOTE — Progress Notes (Addendum)
eLink Physician-Brief Progress Note Patient Name: Lonnie Hansen DOB: 07-20-1950 MRN: [id number]   Date of Service  08/19/2021  HPI/Events of Note  Hypotension - SBP in 80's.  eICU Interventions  Plan: Transfuse additional 2 units PRBC emergently. Blood bank at [facility] notified.  Type and hold for 4 units ahead in the event of further bleeding.      Intervention Category Major Interventions: Hypotension - evaluation and management  [name] 08/19/2021, 9:46 PM

## 2021-08-19 NOTE — Progress Notes (Signed)
RN to check on patient upon entry pt states he was felt like he was hemorrhaging and like he was going to faint. Upon assessment pt had bleeding from his rectum. Rapid response nurse called to bedside to assess patient. Per rapid response recommendation patient was immediately transported to the ICU. Bedside report given to RN instructed to call endo nurse if any other questions needed to be answered.

## 2021-08-19 NOTE — Anesthesia Preprocedure Evaluation (Signed)
Anesthesia Evaluation  Patient identified by MRN, date of birth, ID band Patient awake    Reviewed: Allergy & Precautions, NPO status , Patient's Chart, lab work & pertinent test results, reviewed documented beta blocker date and time   Airway Mallampati: II  TM Distance: >3 FB Neck ROM: Full    Dental  (+) Dental Advisory Given, Missing, Poor Dentition,    Pulmonary sleep apnea and Continuous Positive Airway Pressure Ventilation , former smoker,    Pulmonary exam normal breath sounds clear to auscultation       Cardiovascular hypertension, Pt. on medications and Pt. on home beta blockers + CAD and + Peripheral Vascular Disease (AAA)  + Valvular Problems/Murmurs AI  Rhythm:Regular Rate:Normal  S/p AVR with ascending root replacement 2018  Hx/o Cardiac arrest 04/2020  Hx/o bilateral carotid stenosis 1-39%   Neuro/Psych  Headaches, Glaucoma negative psych ROS   GI/Hepatic Neg liver ROS, GERD  Medicated and Controlled,Choledocholithiasis   Endo/Other  negative endocrine ROSHyperlipidemia  Renal/GU Hx/o renal calculi   BPH    Musculoskeletal  (+) Arthritis , Osteoarthritis,    Abdominal Normal abdominal exam  (+)   Peds  Hematology  (+) Blood dyscrasia, anemia , ITP   Anesthesia Other Findings   Reproductive/Obstetrics                            Anesthesia Physical  Anesthesia Plan  ASA: 3  Anesthesia Plan: General   Post-op Pain Management: Minimal or no pain anticipated   Induction: Intravenous  PONV Risk Score and Plan: 3 and Treatment may vary due to age or medical condition and Ondansetron  Airway Management Planned: Oral ETT  Additional Equipment: None  Intra-op Plan:   Post-operative Plan: Extubation in OR  Informed Consent: I have reviewed the patients History and Physical, chart, labs and discussed the procedure including the risks, benefits and alternatives for the  proposed anesthesia with the patient or authorized representative who has indicated his/her understanding and acceptance.     Dental advisory given  Plan Discussed with: CRNA  Anesthesia Plan Comments: (       Echo 09/16/20 1. Left ventricular ejection fraction, by estimation, is 65 to 70%. The  left ventricle has normal function. The left ventricle has no regional  wall motion abnormalities. There is mild left ventricular hypertrophy.  Left ventricular diastolic parameters  were normal.  2. Right ventricular systolic function is normal. The right ventricular  size is normal.  3. Mild mitral valve regurgitation.  4. 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 No  significant change from report of April 2021. . The aortic valve has been  repaired/replaced. Aortic valve  regurgitation is not visualized. )        Anesthesia Quick Evaluation

## 2021-08-19 NOTE — Op Note (Addendum)
[facility] Patient Name: Lonnie Hansen Procedure Date: 08/19/2021 MRN: [id number] Attending MD: Clarene Essex , MD Date of Birth: 31-Dec-1950 CSN: [id number] Age: 71 Admit Type: Inpatient Procedure:                ERCP Indications:              For therapy of bile duct stone(s) seen on MRCP as                            well as elevated liver tests and resolved abdominal                            pain Providers:                Clarene Essex, MD, Elmer Ramp. Tilden Dome, RN, William Dalton, Technician Referring MD:              Medicines:                General Anesthesia Complications:            No immediate complications. Estimated blood loss:                            Minimal. Estimated Blood Loss:     Estimated blood loss was minimal. Procedure:                Pre-Anesthesia Assessment:                           - Prior to the procedure, a History and Physical                            was performed, and patient medications and                            allergies were reviewed. The patient's tolerance of                            previous anesthesia was also reviewed. The risks                            and benefits of the procedure and the sedation                            options and risks were discussed with the patient.                            All questions were answered, and informed consent                            was obtained. Prior Anticoagulants: The patient has                            taken no previous  anticoagulant or antiplatelet                            agents. ASA Grade Assessment: III - A patient with                            severe systemic disease. After reviewing the risks                            and benefits, the patient was deemed in                            satisfactory condition to undergo the procedure.                           After obtaining informed consent, the scope was                             passed under direct vision. Throughout the                            procedure, the patient's blood pressure, pulse, and                            oxygen saturations were monitored continuously. The                            [id number]-Q190V ([id number] Olympus duodenoscope was                            introduced through the mouth, and used to inject                            contrast into and used to cannulate the bile duct.                            The ERCP was somewhat difficult due to abnormal                            anatomy. Successful completion of the procedure was                            aided by performing the maneuvers documented                            (below) in this report. The patient tolerated the                            procedure well. Scope In: Scope Out: Findings:      The major papilla was on the rim of a diverticulum. To advance the scope       we had to advance past a more proximal diverticulum as well and the       major papilla was mildly edematous. Deep selective cannulation  was       readily obtained and an obvious stone was seen on initial cholangiogram       and we proceeded with a biliary sphincterotomy was made with a Hydratome       sphincterotome using ERBE electrocautery. We proceeded until we could       get the fully bowed sphincterotome easily in and out of the duct and we       did have adequate biliary drainage and there was no bleeding from the       sphincterotomy however when we advanced the fully bowed sphincterotome       there was self limited oozing from the sphincterotomy which did not       require treatment. To discover objects, the biliary tree was swept with       a 12 mm balloon starting at the bifurcation. All stones were removed.       Nothing was found on multiple subsequent balloon pull-through's and the       12 mm balloon passed readily through the patent sphincterotomy site and       there was no residual abnormalities on  occlusion cholangiogram done in       the customary fashion and there was no pancreatic duct injections or       wire advancement throughout the procedure however there was a moderate       amount of clots which could not be washed away but no signs of active       bleeding and we did sweep the clots from the duct 1 more time and the       wire and the balloon catheter were removed and the scope was removed Impression:               - The major papilla was on the rim of a                            diverticulum. A few other diverticulum were seen                           - The major papilla appeared mildly edematous.                           - Choledocholithiasis was found. Complete removal                            was accomplished by biliary sphincterotomy and                            balloon extraction.                           - A biliary sphincterotomy was performed.                           - The biliary tree was swept and nothing was found                            at the end of the procedure. Moderate Sedation:      Not Applicable - Patient had care per Anesthesia.  Recommendation:           - Clear liquid diet today. If doing well tomorrow                            without signs of bleeding may slowly advance diet                           - Continue present medications. Continue                            antibiotics for now                           - Return to GI clinic in 2 weeks. To make sure                            liver tests returned to normal                           - Telephone GI clinic if symptomatic PRN.                           - Check liver enzymes (AST, ALT, alkaline                            phosphatase, bilirubin) tomorrow as well as CBC.                            And it would not surprise me if liver test                            increased based on clots at the sphincterotomy site                            but hopefully that will be  short-lived Procedure Code(s):        --- Professional ---                           [phone], Endoscopic retrograde                            cholangiopancreatography (ERCP); with removal of                            calculi/debris from biliary/pancreatic duct(s)                           43262, Endoscopic retrograde                            cholangiopancreatography (ERCP); with                            sphincterotomy/papillotomy Diagnosis Code(s):        --- Professional ---  K80.50, Calculus of bile duct without cholangitis                            or cholecystitis without obstruction                           K83.8, Other specified diseases of biliary tract CPT copyright 2019 American Medical Association. All rights reserved. The codes documented in this report are preliminary and upon coder review may  be revised to meet current compliance requirements. Clarene Essex, MD 08/19/2021 1:34:17 PM This report has been signed electronically. Number of Addenda: 0

## 2021-08-19 NOTE — Progress Notes (Signed)
Lonnie Hansen 12:27 PM  Subjective: Patient doing well and case discussed with our rounding team and he had his gallbladder out in November and his MRCP was reviewed we rediscussed his procedure and he has no other complaints  Objective: Vital signs stable afebrile exam please see preassessment evaluation left knee slight decrease CBC okay  Assessment: CBD stone  Plan: Okay to proceed with ERCP with anesthesia assistance  [facility]  office [phone] After 5PM or if no answer call [phone]

## 2021-08-19 NOTE — Anesthesia Postprocedure Evaluation (Addendum)
Anesthesia Post Note  Patient: Lonnie Hansen  Procedure(s) Performed: ENDOSCOPIC RETROGRADE CHOLANGIOPANCREATOGRAPHY (ERCP) SPHINCTEROTOMY REMOVAL OF STONES     Patient location during evaluation: PACU Anesthesia Type: General Level of consciousness: awake and alert and oriented Pain management: pain level controlled Vital Signs Assessment: post-procedure vital signs reviewed and stable Respiratory status: spontaneous breathing, nonlabored ventilation, respiratory function stable and patient connected to nasal cannula oxygen Cardiovascular status: blood pressure returned to baseline and stable Postop Assessment: no apparent nausea or vomiting Anesthetic complications: no Comments: Patient had some rectal bleeding post procedure and spitting up blood. Dr. Michail Sermon and [facility] aware. Rapid response called due to being diaphoretic. Patient to be admitted to ICU for further care.   No notable events documented.  Last Vitals:  Vitals:   08/19/21 1510 08/19/21 1520  BP: 131/67 (!) 145/63  Pulse: 75 70  Resp: 17 (!) 23  Temp:    SpO2: 95% 97%    Last Pain:  Vitals:   08/19/21 1500  TempSrc:   PainSc: 0-No pain                 [name] A.

## 2021-08-19 NOTE — Sedation Documentation (Signed)
Anesthesia at bedside to sedate and monitor.

## 2021-08-19 NOTE — Progress Notes (Signed)
[place] Progress Note Patient Name: Lonnie Hansen DOB: [birth year] MRN: [id number]   Date of Service  08/19/2021  HPI/Events of Note  Called by radiology concerning the finding of a large duodenal hematoma with what is felt to be onging bleeding.   eICU Interventions  Plan: Consult IR, Dr. Earleen Newport, for consideration of embolization of active bleeding in the duodenum. IR will see the patient tonight.      Intervention Category Major Interventions: Hypotension - evaluation and management  Lysle Dingwall 08/19/2021, 8:49 PM

## 2021-08-19 NOTE — Progress Notes (Signed)
Notified Lab that ABG being sent for analysis. 

## 2021-08-19 NOTE — Progress Notes (Signed)
-   Patient underwent ERCP with sphincterotomy and CBD stone removal earlier today.  He was noted to have small self-limited sphincterotomy site bleeding.  Subsequently patient started having more bleeding.  Started having hematemesis as well as bright red blood per rectum.  Become hypotensive.  Currently admitted to the ICU.  Receiving second unit of blood transfusion.  On physical exam, he is somewhat lethargic.  Mild abdominal distention but otherwise abdominal exam benign.  Vitals reviewed.  Case discussed with multiple providers including Dr. Michail Sermon, Dr. Watt Climes and ICU attending.  CT angio showed active oozing in the second portion of the duodenum likely from sphincterotomy site.  Plan is for urgent/emergent EGD tonight.  If not able to stop bleeding via EGD, he may need IR guided embolization.  Patient is critically ill with active bleeding requiring ongoing blood transfusion and pressor support.  Total critical care time around 30 minutes which includes discussion with multiple physician as well as care coordination.  Otis Brace MD, [place] 08/19/2021, 9:26 PM  Contact #  [phone]

## 2021-08-19 NOTE — Progress Notes (Signed)
PCCM INTERVAL PROGRESS NOTE  Ongoing BRBPR. Increasing frequency of bowel movements. Gradually more hypotensive. Crossmatch done and 1st unit blood transfusing. 2nd unit ordered. Hemoglobin 10 from 12 but in setting of active bleed probably quite a bit lower.   CBC    Component Value Date/Time   WBC 15.8 (H) 08/19/2021 1551   RBC 3.70 (L) 08/19/2021 1551   HGB 10.8 (L) 08/19/2021 1551   HGB 12.1 (L) 08/13/2021 1410   HGB 13.1 08/17/2018 1204   HCT 33.7 (L) 08/19/2021 1551   HCT 38.6 08/17/2018 1204   PLT 129 (L) 08/19/2021 1551   PLT 78 (L) 08/13/2021 1410   PLT 89 (LL) 08/17/2018 1204   MCV 91.1 08/19/2021 1551   MCV 92 08/17/2018 1204   MCH 29.2 08/19/2021 1551   MCHC 32.0 08/19/2021 1551   RDW 16.3 (H) 08/19/2021 1551   RDW 14.6 08/17/2018 1204   LYMPHSABS 0.9 08/16/2021 1833   LYMPHSABS 1.3 08/17/2018 1204   MONOABS 1.0 08/16/2021 1833   EOSABS 0.0 08/16/2021 1833   EOSABS 0.3 08/17/2018 1204   BASOSABS 0.0 08/16/2021 1833   BASOSABS 0.0 08/17/2018 1204    GIB - increase PRBC transfusion rate and transfuse second unit PRBC. - Have spoken to GI. Will CTA GIB for possible embolization source.  - family has been updated.    Georgann Housekeeper, AGACNP-BC [facility]  See Amion for personal pager PCCM on call pager [phone] until 7pm. Please call Elink 7p-7a. [phone]  08/19/2021 6:58 PM

## 2021-08-19 NOTE — Op Note (Signed)
[facility] Patient Name: Lonnie Hansen Check Procedure Date: 08/19/2021 MRN: [id number] Attending MD: Otis Brace , MD Date of Birth: 04-28-1950 CSN: [id number] Age: 71 Admit Type: Inpatient Procedure:                Upper GI endoscopy Indications:              Hematemesis, Active gastrointestinal bleeding Providers:                Otis Brace, MD, William Dalton, Technician,                            Dulcy Fanny Referring MD:              Medicines:                Sedation Administered by an Anesthesia Professional Complications:            No immediate complications. Estimated Blood Loss:     Estimated blood loss was minimal. Procedure:                Pre-Anesthesia Assessment:                           - Prior to the procedure, a History and Physical                            was performed, and patient medications and                            allergies were reviewed. The patient's tolerance of                            previous anesthesia was also reviewed. The risks                            and benefits of the procedure and the sedation                            options and risks were discussed with the patient.                            All questions were answered, and informed consent                            was obtained. Prior Anticoagulants: The patient has                            taken no previous anticoagulant or antiplatelet                            agents. ASA Grade Assessment: III - A patient with                            severe systemic disease. After reviewing the risks  and benefits, the patient was deemed in                            satisfactory condition to undergo the procedure.                           - Prior to the procedure, a History and Physical                            was performed, and patient medications and                            allergies were reviewed. The patient's  tolerance of                            previous anesthesia was also reviewed. The risks                            and benefits of the procedure and the sedation                            options and risks were discussed with the patient.                            All questions were answered, and informed consent                            was obtained. Prior Anticoagulants: The patient has                            taken no previous anticoagulant or antiplatelet                            agents. ASA Grade Assessment: IV - A patient with                            severe systemic disease that is a constant threat                            to life. After reviewing the risks and benefits,                            the patient was deemed in satisfactory condition to                            undergo the procedure.                           After obtaining informed consent, the endoscope was                            passed under direct vision. Throughout the  procedure, the patient's blood pressure, pulse, and                            oxygen saturations were monitored continuously. The                            [id number]-[id number] ([id number] Olympus endoscope was introduced                            through the mouth, and advanced to the second part                            of duodenum. The upper GI endoscopy was technically                            difficult and complex due to excessive bleeding.                            The patient tolerated the procedure well. Findings:      Red blood was found in the entire esophagus.      Red blood was found in the gastric fundus, in the gastric body and in       the prepyloric region of the stomach.      There was active bleeding noted at sphincterotomy site. It appeared that       large amount of blood was coming out from common bile duct. Dr. Earleen Newport       from interventional radiology was also present during the  procedure. 2.5       cc epinephrine was injected around sphincterotomy site without achieving       hemostasis. Patient will go straight to IR for embolization of GDA.      Red blood was found in the duodenal bulb, in the first portion of the       duodenum and in the second portion of the duodenum. Area was       unsuccessfully injected with 2 mL of a 1:10,000 solution of epinephrine       for hemostasis. Impression:               - Red blood in the esophagus.                           - Red blood in the prepyloric region of the                            stomach, in the gastric fundus and in the gastric                            body.                           - Blood in the duodenal bulb and in the second                            portion of the duodenum. Treatment not successful.                           -  No specimens collected. Moderate Sedation:      Moderate (conscious) sedation was personally administered by an       anesthesia professional. The following parameters were monitored: oxygen       saturation, heart rate, blood pressure, and response to care. Recommendation:           - Transport patient to IR Procedure Code(s):        --- Professional ---                           [id number], Esophagogastroduodenoscopy, flexible,                            transoral; with control of bleeding, any method Diagnosis Code(s):        --- Professional ---                           K22.8, Other specified diseases of esophagus                           K92.2, Gastrointestinal hemorrhage, unspecified                           K92.0, Hematemesis CPT copyright 2019 American Medical Association. All rights reserved. The codes documented in this report are preliminary and upon coder review may  be revised to meet current compliance requirements. Otis Brace, MD Otis Brace, MD 08/19/2021 10:20:22 PM Number of Addenda: 0

## 2021-08-19 NOTE — Consult Note (Signed)
Chief Complaint: Emergent GI hemorrhage  Referring Physician(s): Dr. Oletta Darter, CCM  Supervising Physician: Corrie Mckusick  Patient Status: [facility] - In-pt  History of Present Illness: Lonnie Hansen is a 71 y.o. male presenting to the [place] service with acute, emergent, life-threatening hemorrhage of the GI tract.   Lonnie Hansen was admitted 08/16/21 with RUQ pain, and transaminitis, undergoing ERCP and sphincterotomy for choledocholithiasis 08/19/21.    He then developed hypotension, shock symptoms, with CTA performed for suspected GI bleeding.  This confirmed active extravasation at the site of the CBD, with duodenal hemorrhage.   I met Lonnie Hansen family, wife, in the room, and she confirmed that he has a history of aneurysm and apparent pseudoaneurysm disease, with 2 prior episodes of "coding" for ruptured arteries.  I briefly explained the logistics of angiogram and embolization in this scenario, and she verbally consented to the procedure.  At this time the patient was in endoscopy.   I met Lonnie Hansen in endoscopy, having been intubated with the GI team working for some hemostasis Hansen what appears to be active hemorrhage at the ampulla.    He is currently undergoing resuscitation, and and levophed.   He will also require some central venous access  Past Medical History:  Diagnosis Date   AAA (abdominal aortic aneurysm) (HCC)    Anemia    Arthritis    Asymptomatic bilateral carotid artery stenosis 08/2015   1-39%    Cataracts, bilateral    Chronic ITP (idiopathic thrombocytopenia) ([facility]) 03/31/2018   Coronary artery disease    Diverticulosis    Enlarged aorta (HCC)    Enlarged prostate    slightly   GERD (gastroesophageal reflux disease)    takes Pantoprazole daily as needed   Glaucoma    uses eye drops daily   Headache    History of colon polyps    benign   History of kidney stones    Hyperlipidemia    no Hansen any meds   Hypertension    takes Amlodipine and Atenolol daily    OSA Hansen CPAP    Vocal cord nodule    pt. states  it's a" growth Hansen vocal cord"    Past Surgical History:  Procedure Laterality Date   AORTIC ARCH ANGIOGRAPHY N/A 04/16/2016   Procedure: Aortic Arch Angiography;  Surgeon: [name] Martinique, MD;  Location: [place] [facility];  Service: Cardiovascular;  Laterality: N/A;   AORTIC VALVE REPLACEMENT N/A 09/15/2016   Procedure: AORTIC VALVE REPLACEMENT (AVR);  Surgeon: Grace Isaac, MD;  Location: [place];  Service: Open Heart Surgery;  Laterality: N/A;  Using 65m Edwards Perimount Magna Ease Aortic Bioprosthesis Valve   ASCENDING AORTIC ROOT REPLACEMENT N/A 09/15/2016   Procedure: ASCENDING AORTIC ROOT REPLACEMENT;  Surgeon: GGrace Isaac MD;  [facility]  Service: Open Heart Surgery;  Laterality: N/A;  Using 36mGelweave Valsalva Graft   CHOLECYSTECTOMY N/A 12/19/2020   Procedure: LAPAROSCOPIC CHOLECYSTECTOMY WITH POSSIBLE INTRAOPERATIVE CHOLANGIOGRAM;  Surgeon: WiGreer PickerelMD;  [facility] Service: [facility];  Laterality: N/A;   COLONOSCOPY     COLONOSCOPY Left 04/16/2019   Procedure: COLONOSCOPY;  Surgeon: OuArta SilenceMD;  [facility];  Service: Endoscopy;  Laterality: Left;   COLONOSCOPY N/A 06/01/2020   Procedure: COLONOSCOPY;  Surgeon: KaRonnette JuniperMD;  [facility]: G[facility]enterology;  Laterality: N/A;   COLONOSCOPY WITH ESOPHAGOGASTRODUODENOSCOPY (EGD)     COLONOSCOPY WITH PROPOFOL N/A 05/14/2021   Procedure: COLONOSCOPY WITH PROPOFOL;  Surgeon: Ronnette Juniper, MD;  Location: [facility];  Service: Gastroenterology;  Laterality: N/A;   ESOPHAGOGASTRODUODENOSCOPY N/A 05/25/2020   Procedure: ESOPHAGOGASTRODUODENOSCOPY (EGD);  Surgeon: Wilford Corner, MD;  Location: [place];  Service: Endoscopy;  Laterality: N/A;   ESOPHAGOGASTRODUODENOSCOPY (EGD) WITH PROPOFOL Left 04/16/2019   Procedure: ESOPHAGOGASTRODUODENOSCOPY (EGD) WITH PROPOFOL;  Surgeon: Arta Silence, MD;  Location: [facility]COPY;   Service: Endoscopy;  Laterality: Left;   FLEXIBLE SIGMOIDOSCOPY N/A 05/25/2020   Procedure: FLEXIBLE SIGMOIDOSCOPY;  Surgeon: Wilford Corner, MD;  Location: [place];  Service: Endoscopy;  Laterality: N/A;   IR ANGIOGRAM SELECTIVE EACH ADDITIONAL VESSEL  05/26/2021   IR ANGIOGRAM VISCERAL SELECTIVE  05/26/2021   IR EMBO ART  VEN HEMORR LYMPH EXTRAV  INC GUIDE ROADMAPPING  05/26/2021   IR THORACENTESIS ASP PLEURAL SPACE W/IMG GUIDE  10/23/2016   IR US GUIDE VASC ACCESS LEFT  05/26/2021   IR US GUIDE VASC ACCESS RIGHT  05/26/2021   MICROLARYNGOSCOPY Right 09/20/2017   Procedure: MICROLARYNGOSCOPY WITH  EXCISION OF VOCAL CORD LESION;  Surgeon: Leta Baptist, MD;  Location: [place];  Service: ENT;  Laterality: Right;   MULTIPLE EXTRACTIONS WITH ALVEOLOPLASTY N/A 06/10/2016   Procedure: Extraction of tooth #'s 2,8,13,15, and 29  with alveoloplasty, maxillary right and left buccal exostoses reductions, and gross debridement of remaining teeth.;  Surgeon: Lenn Cal, DDS;  Location: [place];  Service: Oral Surgery;  Laterality: N/A;   POLYPECTOMY  05/14/2021   Procedure: POLYPECTOMY;  Surgeon: Ronnette Juniper, MD;  Location: [facility];  Service: Gastroenterology;;   RIGHT/LEFT HEART CATH AND CORONARY ANGIOGRAPHY N/A 04/16/2016   Procedure: Right/Left Heart Cath and Coronary Angiography;  Surgeon: [name] Martinique, MD;  Location: [place] [facility];  Service: Cardiovascular;  Laterality: N/A;   TEE WITHOUT CARDIOVERSION N/A 09/15/2016   Procedure: TRANSESOPHAGEAL ECHOCARDIOGRAM (TEE);  Surgeon: Grace Isaac, MD;  Location: [place];  Service: Open Heart Surgery;  Laterality: N/A;    Allergies: Patient has no known allergies.  Medications: Prior to Admission medications   Medication Sig Start Date End Date Taking? Authorizing Provider  acetaminophen (TYLENOL) 500 MG tablet Take 1,000 mg by mouth daily as needed (pain).   Yes [provider]  amLODipine (NORVASC) 10 MG tablet  Take 10 mg by mouth daily. 06/24/21  Yes [provider]  atenolol (TENORMIN) 25 MG tablet Take 12.5 mg by mouth 2 (two) times daily. 08/26/20  Yes [name], MD  fluticasone [facility]) 50 MCG/ACT nasal spray Place 2 sprays into both nostrils daily as needed for allergies or rhinitis.   Yes [provider]  latanoprost (XALATAN) 0.005 % ophthalmic solution Place 1 drop into both eyes 2 (two) times daily.   Yes [provider]  pantoprazole (PROTONIX) 40 MG tablet Take 40 mg by mouth 2 (two) times daily. 11/09/20  Yes [provider]  polyethylene glycol (MIRALAX / GLYCOLAX) 17 g packet Take 17 g by mouth daily. Patient taking differently: Take 17 g by mouth daily as needed for mild constipation. Every other day 06/04/20  Yes [name], MD  psyllium (HYDROCIL/METAMUCIL) 95 % PACK Take 1 packet by mouth daily. Patient taking differently: Take 1 packet by mouth daily as needed for mild constipation. 06/04/20  Yes [name], MD  rosuvastatin (CRESTOR) 20 MG tablet Take 20 mg by mouth daily.   Yes [provider]  triamcinolone cream (KENALOG) 0.1 % Apply 1 application. topically 3 (three) times daily as needed (facial itching/rash). 05/16/20  Yes [provider]  ferrous sulfate 325 (65 FE) MG tablet Take 325 mg by mouth daily. Patient not taking: Reported Hansen 07/30/2021    [provider]  Multiple Vitamin (MULTIVITAMIN WITH MINERALS) TABS tablet Take 1 tablet by mouth daily. Patient not taking: Reported Hansen 08/17/2021    [provider]     Family History  Problem Relation Age of Onset   Heart disease Father    Heart attack Father    Thyroid disease Sister     Social History   Socioeconomic History   Marital status: Married    Spouse name: Not Hansen file   Number of children: 1   Years of education: Not Hansen file   Highest education level: Not Hansen file  Occupational History   Occupation: Musician  Tobacco Use    Smoking status: Former    Packs/day: 1.00    Years: 25.00    Total pack years: 25.00    Types: Cigarettes   Smokeless tobacco: Never  Vaping Use   Vaping Use: Never used  Substance and Sexual Activity   Alcohol use: No    Alcohol/week: 0.0 standard drinks of alcohol   Drug use: No   Sexual activity: Not Hansen file  Other Topics Concern   Not Hansen file  Social History Narrative   Married.   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   Social Determinants of Health   Financial Resource Strain: Not Hansen file  Food Insecurity: No Food Insecurity (05/30/2020)   Hunger Vital Sign    Worried About Running Out of Food in the Last Year: Never true    Ran Out of Food in the Last Year: Never true  Transportation Needs: No Transportation Needs (05/30/2020)   PRAPARE - Hydrologist (Medical): No    Lack of Transportation (Non-Medical): No  Physical Activity: Not Hansen file  Stress: Not Hansen file  Social Connections: Not Hansen file      Review of Systems: A 12 point ROS discussed and pertinent positives are indicated in the HPI above.  All other systems are negative.  Review of Systems  Vital Signs: BP (!) 86/59   Pulse (!) 115   Temp 97.7 F (36.5 C)   Resp (!) 24   Ht _0  (1.778 m)   Wt 85.3 kg   SpO2 99%   BMI 26.98 kg/m   Advance Care Plan: The advanced care plan/surrogate decision maker was discussed at the time of visit and documented in the medical record.    Physical Exam  Imaging: CT ANGIO GI BLEED  Addendum Date: 08/19/2021   ADDENDUM REPORT: 08/19/2021 20:39 ADDENDUM: These results were called by telephone at the time of interpretation Hansen 08/19/2021 at 8:35 pm to provider Dr Oletta Darter, who verbally acknowledged these results. Electronically Signed   By: Julian Hy M.D.   Hansen: 08/19/2021 20:39   Result Date: 08/19/2021 CLINICAL DATA:  Lower GI bleed EXAM: CTA ABDOMEN AND PELVIS WITHOUT AND WITH CONTRAST TECHNIQUE: Multidetector CT imaging of the  abdomen and pelvis was performed using the standard protocol during bolus administration of intravenous contrast. Multiplanar reconstructed images and MIPs were obtained and reviewed to evaluate the vascular anatomy. RADIATION DOSE REDUCTION: This exam was performed according to the departmental dose-optimization program which includes automated exposure control, adjustment of the mA and/or kV according to patient size and/or use of iterative reconstruction technique. CONTRAST:  198m OMNIPAQUE IOHEXOL 350 MG/ML SOLN  COMPARISON:  MRI abdomen dated 08/17/2021. CT abdomen/pelvis dated 08/16/2021. FINDINGS: VASCULAR Aorta: 4.9 x 5.1 cm infrarenal abdominal aortic aneurysm. Atherosclerotic calcifications of the aortic arch. Patent. Celiac: Patent. SMA: Patent. Renals: Patent bilaterally. IMA: Patent. Inflow: Patent bilaterally. Atherosclerotic calcifications. 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 3.7 cm short axis left common iliac artery aneurysm. Proximal Outflow: Patent. Veins: Unremarkable. Active intravasation of contrast within the lumen of the 2nd portion of the duodenum (series 5/images 91 and 94). This reflects the site of the patient's active GI bleeding. Associated findings described below. Review of the MIP images confirms the above findings. NON-VASCULAR Lower chest: Lung bases are clear.  Prosthetic aortic valve. Hepatobiliary: Subcentimeter hepatic cysts. Mild intrahepatic and extrahepatic ductal dilatation. Hyperdense material within the common duct (series 7/image 31), possibly postprocedural versus hemorrhage. Pancreas: Within normal limits. Spleen: Within normal limits. Adrenals/Urinary Tract: Adrenal glands are within normal limits. Bilateral renal cysts, most of which are simple, including a 5.0 cm right lower pole renal sinus cyst (series 7/image 34). Additional 2.1 cm lateral left upper pole cyst with calcified septations (series 2/image 30), but no enhancement following contrast  administration, benign (Bosniak II). No renal calculi or hydronephrosis. Bladder is within normal limits. Stomach/Bowel: Enteric tube terminates in the distal gastric body. 4.3 x 6.3 cm hematoma expanding the 1st and 2nd portion of the duodenum (series 2/image 38) and an adjacent duodenal diverticulum (coronal image 68). This corresponds to the site of active GI bleeding identified above. Hyperdense material/hemorrhage extends into the 3rd portion of the duodenum (series 2/image 42). No evidence of bowel obstruction. Normal appendix (series 5/image 120). Extensive sigmoid diverticulosis, without evidence of diverticulitis. Lymphatic: Small upper abdominal lymph nodes. No suspicious abdominopelvic lymphadenopathy. Reproductive: Prostate is unremarkable. Other: No abdominopelvic ascites. Musculoskeletal: Mild degenerative changes the lumbar spine. Median sternotomy. IMPRESSION: Active GI bleeding within the 2nd portion the duodenum. Associated large hematoma. Hyperdense material in the common duct, possibly postprocedural versus hemorrhage given additional findings. Associated mild intrahepatic and extrahepatic ductal dilatation. 5.1 cm infrarenal abdominal aortic aneurysm. 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 Consider vascular surgery consultation. Aortic Atherosclerosis (ICD10-I70.0). Electronically Signed: By: Julian Hy M.D. Hansen: 08/19/2021 20:25   DG Abd 1 View  Result Date: 08/19/2021 CLINICAL DATA:  Encounter for nasogastric tube placement. EXAM: ABDOMEN - 1 VIEW COMPARISON:  CT abdomen and pelvis 08/16/2021 FINDINGS: AP portable view of the abdomen was obtained. Nasogastric tube extends into the abdomen and the tip is in the gastric body region. Evidence for cholecystectomy clips. Evidence for embolization coils in the upper pelvis. Nonobstructive bowel gas pattern. Severe joint space loss along the superior right hip joint with remodeling in the right femoral head.  IMPRESSION: 1. Nasogastric tube tip in the stomach body region. 2. Severe joint space loss and degenerative changes in the right hip. Electronically Signed   By: Markus Daft M.D.   Hansen: 08/19/2021 16:20   DG ERCP  Result Date: 08/19/2021 CLINICAL DATA:  Suspected choledocholithiasis EXAM: ERCP TECHNIQUE: Multiple spot images obtained with the fluoroscopic device and submitted for interpretation post-procedure. COMPARISON:  MRCP 08/17/2021 FINDINGS: Series of fluoroscopic spot images document endoscopic cannulation and opacification of the CBD. Filling defects are noted in the distal CBD consistent with choledocholithiasis. Subsequent image demonstrates balloon catheter placement into the common bile duct. The intrahepatic ducts are incompletely visualized, mildly dilated centrally. Cholecystectomy clips. No extravasation evident. IMPRESSION: Endoscopic CBD cannulation and intervention as above. These images were submitted for  radiologic interpretation only. Please see the procedural report for the amount of contrast and the fluoroscopy time utilized. Electronically Signed   By: Lucrezia Europe M.D.   Hansen: 08/19/2021 14:16   Lonnie ABDOMEN MRCP W WO CONTAST  Result Date: 08/17/2021 CLINICAL DATA:  Biliary obstruction suspected EXAM: MRI ABDOMEN WITHOUT AND WITH CONTRAST (INCLUDING MRCP) TECHNIQUE: Multiplanar multisequence Lonnie imaging of the abdomen was performed both before and after the administration of intravenous contrast. Heavily T2-weighted images of the biliary and pancreatic ducts were obtained, and three-dimensional MRCP images were rendered by post processing. CONTRAST:  72m GADAVIST GADOBUTROL 1 MMOL/ML IV SOLN COMPARISON:  CT abdomen pelvis, 08/16/2021 FINDINGS: Lower chest: No acute findings. Hepatobiliary: No mass or other parenchymal abnormality identified. Status post cholecystectomy. Suspect at least one gallstone in the central common bile duct near the ampulla measuring 0.6 cm (series 12, image 19,  series 11, image 21). No significant biliary ductal dilatation, however there is periportal edema (series 3, image 19) Pancreas: No mass, inflammatory changes, or other parenchymal abnormality identified.No pancreatic ductal dilatation. Spleen:  Within normal limits in size and appearance. Adrenals/Urinary Tract: Normal adrenal glands. Multiple bilateral renal cortical and parapelvic cysts. No solid mass or suspicious contrast enhancement. Heterogeneous, septated cyst of the superior pole of the left kidney, which is calcified Hansen comparison CT (series 17, image 21). No renal masses or suspicious contrast enhancement identified. No evidence of hydronephrosis. Stomach/Bowel: Descending colonic diverticulosis. Visualized portions within the abdomen are otherwise unremarkable. Vascular/Lymphatic: No pathologically enlarged lymph nodes identified. Partially imaged abdominal aortic aneurysm measuring 4.9 x 4.7 cm (series 12, image 20). Other:  None. Musculoskeletal: No suspicious osseous lesions identified. IMPRESSION: 1. Suspect at least one gallstone in the central common bile duct near the ampulla measuring 0.6 cm. No significant biliary ductal dilatation, however there is periportal edema. Findings are concerning for choledocholithiasis status post cholecystectomy. 2. Partially imaged abdominal aortic aneurysm measuring 4.9 x 4.7 cm. 3. Descending colonic diverticulosis. These results will be called to the ordering clinician or representative by the Radiologist Assistant, and communication documented in the PACS or [employer] Electronically Signed   By: ADelanna AhmadiM.D.   Hansen: 08/17/2021 12:10   Lonnie 3D Recon At Scanner  Result Date: 08/17/2021 CLINICAL DATA:  Biliary obstruction suspected EXAM: MRI ABDOMEN WITHOUT AND WITH CONTRAST (INCLUDING MRCP) TECHNIQUE: Multiplanar multisequence Lonnie imaging of the abdomen was performed both before and after the administration of intravenous contrast. Heavily  T2-weighted images of the biliary and pancreatic ducts were obtained, and three-dimensional MRCP images were rendered by post processing. CONTRAST:  971mGADAVIST GADOBUTROL 1 MMOL/ML IV SOLN COMPARISON:  CT abdomen pelvis, 08/16/2021 FINDINGS: Lower chest: No acute findings. Hepatobiliary: No mass or other parenchymal abnormality identified. Status post cholecystectomy. Suspect at least one gallstone in the central common bile duct near the ampulla measuring 0.6 cm (series 12, image 19, series 11, image 21). No significant biliary ductal dilatation, however there is periportal edema (series 3, image 19) Pancreas: No mass, inflammatory changes, or other parenchymal abnormality identified.No pancreatic ductal dilatation. Spleen:  Within normal limits in size and appearance. Adrenals/Urinary Tract: Normal adrenal glands. Multiple bilateral renal cortical and parapelvic cysts. No solid mass or suspicious contrast enhancement. Heterogeneous, septated cyst of the superior pole of the left kidney, which is calcified Hansen comparison CT (series 17, image 21). No renal masses or suspicious contrast enhancement identified. No evidence of hydronephrosis. Stomach/Bowel: Descending colonic diverticulosis. Visualized portions within the abdomen are otherwise unremarkable. Vascular/Lymphatic:  No pathologically enlarged lymph nodes identified. Partially imaged abdominal aortic aneurysm measuring 4.9 x 4.7 cm (series 12, image 20). Other:  None. Musculoskeletal: No suspicious osseous lesions identified. IMPRESSION: 1. Suspect at least one gallstone in the central common bile duct near the ampulla measuring 0.6 cm. No significant biliary ductal dilatation, however there is periportal edema. Findings are concerning for choledocholithiasis status post cholecystectomy. 2. Partially imaged abdominal aortic aneurysm measuring 4.9 x 4.7 cm. 3. Descending colonic diverticulosis. These results will be called to the ordering clinician or  representative by the Radiologist Assistant, and communication documented in the PACS or [employer]. Electronically Signed   By: Delanna Ahmadi M.D.   Hansen: 08/17/2021 12:10   CT Abdomen Pelvis W Contrast  Result Date: 08/16/2021 CLINICAL DATA:  Abdominal pain, acute, nonlocalized ruq abd pain. right upper quadrant pain , pt has no gall bladder, has a mrcp tomorrow at [facility]. EXAM: CT ABDOMEN AND PELVIS WITH CONTRAST TECHNIQUE: Multidetector CT imaging of the abdomen and pelvis was performed using the standard protocol following bolus administration of intravenous contrast. RADIATION DOSE REDUCTION: This exam was performed according to the departmental dose-optimization program which includes automated exposure control, adjustment of the mA and/or kV according to patient size and/or use of iterative reconstruction technique. CONTRAST:  71m OMNIPAQUE IOHEXOL 300 MG/ML  SOLN COMPARISON:  CT abdomen pelvis angiography 05/11/2021 FINDINGS: Lower chest: Elevated left hemidiaphragm with passive atelectasis of the left lower lobe. Otherwise no acute abnormality. Partially visualized aortic valve replacement. Possible tiny hiatal hernia. Hepatobiliary: Several scattered subcentimeter hypodensities too small to characterize. Otherwise no focal liver abnormality. Status post cholecystectomy. No biliary dilatation. Query slightly enhancement/thickening of the common bile duct wall. Pancreas: No focal lesion. Normal pancreatic contour. No surrounding inflammatory changes. No main pancreatic ductal dilatation. Spleen: Normal in size without focal abnormality. Adrenals/Urinary Tract: No adrenal nodule bilaterally. Bilateral kidneys enhance symmetrically. Simple free fluid parapelvic cyst Hansen the right. Other simple fluid densities within the kidneys likely represent simple renal cysts. Simple renal cysts, in the absence of clinically indicated signs/symptoms, require no independent follow-up. Redemonstration of a  1.3 cm left renal calcification. No hydronephrosis. No hydroureter. The urinary bladder is unremarkable. Stomach/Bowel: Stomach is within normal limits. No evidence of bowel wall thickening or dilatation. Diffuse colonic diverticulosis. Appendix appears normal. Vascular/Lymphatic: Interval increase of an infrarenal abdominal aorta aneurysm measuring up to 5.3 x 4.8 cm (from 4.8 x 4.4 cm) with finding extending for approximately 6 cm in the craniocaudal dimension and extending to the aortic bifurcation. 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 Mild atherosclerotic plaque of the aorta and its branches. No abdominal, pelvic, or inguinal lymphadenopathy. Reproductive: Prostate is unremarkable. Other: No intraperitoneal free fluid. No intraperitoneal free gas. No organized fluid collection. Musculoskeletal: No abdominal wall hernia or abnormality. No suspicious lytic or blastic osseous lesions. No acute displaced fracture. Multilevel degenerative changes of the spine. 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 IMPRESSION: 1. Interval increase of an infrarenal abdominal aorta aneurysm measuring up to 5.3 x 4.8 cm (from 4.8 x 4.4 cm). Recommend referral to a vascular specialist. This recommendation follows ACR consensus guidelines: White Paper of the ACR Incidental Findings Committee II Hansen Vascular Findings. J Am Coll Radiol 2013; 10:789-794. 2. Interval increase in size of a left internal iliac aneurysm measuring up to 3.7 cm (from 3.3  cm). 3. 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 4. Status post cholecystectomy. Query slightly enhancement/thickening of the common bile duct wall. 5. Possible tiny hiatal hernia. 6. Diffuse colonic diverticulosis with no acute diverticulitis. 7. Nonobstructive left nephrolithiasis measuring up to 1.3 cm. 8.  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 Electronically Signed   By: Iven Finn M.D.   Hansen: 08/16/2021 20:41    Labs:  CBC: Recent Labs    08/17/21 0548 08/18/21 0534 08/19/21 0521 08/19/21 1551  WBC 7.9 10.6* 9.2 15.8*  HGB 12.9* 12.6* 11.8* 10.8*  HCT 39.9 39.1 39.5 33.7*  PLT 109* 109* 91* 129*    COAGS: Recent Labs    12/19/20 0047 05/26/21 0650 08/19/21 1707  INR 1.1 1.2 1.1  APTT  --  23*  --     BMP: Recent Labs    08/17/21 0548 08/18/21 0534 08/19/21 0521 08/19/21 1551  NA 138 136 136 137  K 3.9 3.2* 3.4* 2.8*  CL 99 99 101 102  CO2 _0 21*  GLUCOSE 125* 102* 97 165*  BUN _1 CALCIUM 9.3 9.3 9.0 8.4*  CREATININE 0.84 1.06 0.95 1.01  GFRNONAA >60 >60 >60 >60    LIVER FUNCTION TESTS: Recent Labs    08/17/21 0548 08/18/21 0534 08/19/21 0521 08/19/21 1551  BILITOT 4.7* 1.8* 1.6* 3.2*  AST 416* 205* 112* 223*  ALT 398* 316* 215* 232*  ALKPHOS 268* 230* 213* 270*  PROT 8.2* 7.8 7.2 6.5  ALBUMIN 4.3 4.1 3.5 3.2*    TUMOR MARKERS: No results for input(s): "AFPTM", "CEA", "CA199", "CHROMGRNA" in the last 8760 hours.  Assessment and Plan:  Lonnie Hansen is 71 yo male with active, life-threatening upper GI hemorrhage near the site of sphincterotomy.   I think he is predisposed to this type of injury related to his history of aneurysm/pseudoaneurysm.   He is currently undergoing resuscitation in endoscopy, and VIR is asked to perform angiogram and possible embolization.    I have met his wife and sister, with whom I have discussed the plan of angiogram and possible embolization.  I briefly discussed the risk/benefit of the procedure with risks to include: bleeding, infection, contrast reaction, organ injury, non-target embolization, need for additional surgery/procedure, cardiopulmonary collapse, death.    We will also plan Hansen central venous catheter placement.  At the time of our discussion, the  patient was in endoscopy.  The patient's wife and sister were present for our discussion, and agree with emergent consent.    Plan to proceed emergently with angiogram, possible embolization, and central venous catheter placement.    Thank you for this interesting consult.  I greatly enjoyed meeting Lonnie Hansen and look forward to participating in their care.  A copy of this report was sent to the requesting provider Hansen this date.  Electronically Signed: Corrie Mckusick, DO 08/19/2021, 9:47 PM   I spent a total of 61 Miinutes    in face to face in clinical consultation, greater than 50% of which was counseling/coordinating care for emergent, life-threatening upper GI hemorrhage, angiogram, possible embolization

## 2021-08-19 NOTE — Transfer of Care (Signed)
Immediate Anesthesia Transfer of Care Note  Patient: Lonnie Hansen  Procedure(s) Performed: ENDOSCOPIC RETROGRADE CHOLANGIOPANCREATOGRAPHY (ERCP) SPHINCTEROTOMY REMOVAL OF STONES  Patient Location: Endoscopy Unit  Anesthesia Type:General  Level of Consciousness: drowsy and patient cooperative  Airway & Oxygen Therapy: Patient Spontanous Breathing and Patient connected to face mask  Post-op Assessment: Report given to RN and Post -op Vital signs reviewed and stable  Post vital signs: Reviewed and stable  Last Vitals:  Vitals Value Taken Time  BP    Temp    Pulse 65 08/19/21 1333  Resp 19 08/19/21 1333  SpO2 96 % 08/19/21 1333    Last Pain:  Vitals:   08/19/21 1158  TempSrc: Temporal  PainSc:       Patients Stated Pain Goal: 0 (55/97/41 6384)  Complications: No notable events documented.

## 2021-08-20 ENCOUNTER — Other Ambulatory Visit: Payer: Medicare Other

## 2021-08-20 ENCOUNTER — Inpatient Hospital Stay (HOSPITAL_COMMUNITY): Payer: Medicare Other

## 2021-08-20 ENCOUNTER — Encounter (HOSPITAL_COMMUNITY): Payer: Self-pay | Admitting: Gastroenterology

## 2021-08-20 DIAGNOSIS — K922 Gastrointestinal hemorrhage, unspecified: Secondary | ICD-10-CM | POA: Diagnosis not present

## 2021-08-20 DIAGNOSIS — D693 Immune thrombocytopenic purpura: Secondary | ICD-10-CM | POA: Diagnosis not present

## 2021-08-20 DIAGNOSIS — R7989 Other specified abnormal findings of blood chemistry: Secondary | ICD-10-CM

## 2021-08-20 DIAGNOSIS — I7143 Infrarenal abdominal aortic aneurysm, without rupture: Secondary | ICD-10-CM | POA: Diagnosis not present

## 2021-08-20 DIAGNOSIS — K8050 Calculus of bile duct without cholangitis or cholecystitis without obstruction: Secondary | ICD-10-CM

## 2021-08-20 HISTORY — PX: IR EMBO ART  VEN HEMORR LYMPH EXTRAV  INC GUIDE ROADMAPPING: IMG5450

## 2021-08-20 HISTORY — PX: IR ANGIOGRAM VISCERAL SELECTIVE: IMG657

## 2021-08-20 HISTORY — PX: IR ANGIOGRAM SELECTIVE EACH ADDITIONAL VESSEL: IMG667

## 2021-08-20 HISTORY — PX: IR FLUORO GUIDE CV LINE RIGHT: IMG2283

## 2021-08-20 HISTORY — PX: IR US GUIDE VASC ACCESS RIGHT: IMG2390

## 2021-08-20 LAB — HEMOGLOBIN AND HEMATOCRIT, BLOOD
HCT: 35.7 % — ABNORMAL LOW (ref 39.0–52.0)
HCT: 31.7 % — ABNORMAL LOW (ref 39.0–52.0)
Hemoglobin: 12 g/dL — ABNORMAL LOW (ref 13.0–17.0)
Hemoglobin: 10.7 g/dL — ABNORMAL LOW (ref 13.0–17.0)

## 2021-08-20 LAB — GLUCOSE, CAPILLARY: Glucose-Capillary: 134 mg/dL — ABNORMAL HIGH (ref 70–99)

## 2021-08-20 LAB — CBC
HCT: 35.1 % — ABNORMAL LOW (ref 39.0–52.0)
HCT: 29 % — ABNORMAL LOW (ref 39.0–52.0)
Hemoglobin: 11.8 g/dL — ABNORMAL LOW (ref 13.0–17.0)
Hemoglobin: 9.9 g/dL — ABNORMAL LOW (ref 13.0–17.0)
MCH: 29.6 pg (ref 26.0–34.0)
MCH: 30 pg (ref 26.0–34.0)
MCHC: 33.6 g/dL (ref 30.0–36.0)
MCHC: 34.1 g/dL (ref 30.0–36.0)
MCV: 88.2 fL (ref 80.0–100.0)
MCV: 87.9 fL (ref 80.0–100.0)
Platelets: 112 10*3/uL — ABNORMAL LOW (ref 150–400)
Platelets: 102 10*3/uL — ABNORMAL LOW (ref 150–400)
RBC: 3.98 MIL/uL — ABNORMAL LOW (ref 4.22–5.81)
RBC: 3.3 MIL/uL — ABNORMAL LOW (ref 4.22–5.81)
RDW: 16.5 % — ABNORMAL HIGH (ref 11.5–15.5)
RDW: 16.5 % — ABNORMAL HIGH (ref 11.5–15.5)
WBC: 18.2 10*3/uL — ABNORMAL HIGH (ref 4.0–10.5)
WBC: 19.2 10*3/uL — ABNORMAL HIGH (ref 4.0–10.5)
nRBC: 0 % (ref 0.0–0.2)
nRBC: 0 % (ref 0.0–0.2)

## 2021-08-20 LAB — COMPREHENSIVE METABOLIC PANEL
ALT: 196 U/L — ABNORMAL HIGH (ref 0–44)
ALT: 183 U/L — ABNORMAL HIGH (ref 0–44)
AST: 140 U/L — ABNORMAL HIGH (ref 15–41)
AST: 116 U/L — ABNORMAL HIGH (ref 15–41)
Albumin: 2.7 g/dL — ABNORMAL LOW (ref 3.5–5.0)
Albumin: 2.9 g/dL — ABNORMAL LOW (ref 3.5–5.0)
Alkaline Phosphatase: 223 U/L — ABNORMAL HIGH (ref 38–126)
Alkaline Phosphatase: 165 U/L — ABNORMAL HIGH (ref 38–126)
Anion gap: 11 (ref 5–15)
Anion gap: 9 (ref 5–15)
BUN: 27 mg/dL — ABNORMAL HIGH (ref 8–23)
BUN: 41 mg/dL — ABNORMAL HIGH (ref 8–23)
CO2: 20 mmol/L — ABNORMAL LOW (ref 22–32)
CO2: 21 mmol/L — ABNORMAL LOW (ref 22–32)
Calcium: 7.7 mg/dL — ABNORMAL LOW (ref 8.9–10.3)
Calcium: 7.8 mg/dL — ABNORMAL LOW (ref 8.9–10.3)
Chloride: 105 mmol/L (ref 98–111)
Chloride: 107 mmol/L (ref 98–111)
Creatinine, Ser: 1.82 mg/dL — ABNORMAL HIGH (ref 0.61–1.24)
Creatinine, Ser: 1.79 mg/dL — ABNORMAL HIGH (ref 0.61–1.24)
GFR, Estimated: 39 mL/min — ABNORMAL LOW (ref >60)
GFR, Estimated: 40 mL/min — ABNORMAL LOW (ref >60)
Glucose, Bld: 170 mg/dL — ABNORMAL HIGH (ref 70–99)
Glucose, Bld: 161 mg/dL — ABNORMAL HIGH (ref 70–99)
Potassium: 5.2 mmol/L — ABNORMAL HIGH (ref 3.5–5.1)
Potassium: 4.3 mmol/L (ref 3.5–5.1)
Sodium: 136 mmol/L (ref 135–145)
Sodium: 137 mmol/L (ref 135–145)
Total Bilirubin: 2.9 mg/dL — ABNORMAL HIGH (ref 0.3–1.2)
Total Bilirubin: 1.3 mg/dL — ABNORMAL HIGH (ref 0.3–1.2)
Total Protein: 5.5 g/dL — ABNORMAL LOW (ref 6.5–8.1)
Total Protein: 5.6 g/dL — ABNORMAL LOW (ref 6.5–8.1)

## 2021-08-20 LAB — BLOOD GAS, ARTERIAL
Acid-base deficit: 5.7 mmol/L — ABNORMAL HIGH (ref 0.0–2.0)
Bicarbonate: 17.5 mmol/L — ABNORMAL LOW (ref 20.0–28.0)
O2 Saturation: 100 %
Patient temperature: 36.5
pCO2 arterial: 26 mmHg — ABNORMAL LOW (ref 32–48)
pH, Arterial: 7.43 (ref 7.35–7.45)
pO2, Arterial: 426 mmHg — ABNORMAL HIGH (ref 83–108)

## 2021-08-20 LAB — PROTIME-INR
INR: 1.2 (ref 0.8–1.2)
Prothrombin Time: 14.7 seconds (ref 11.4–15.2)

## 2021-08-20 LAB — LACTIC ACID, PLASMA
Lactic Acid, Venous: 4.7 mmol/L (ref 0.5–1.9)
Lactic Acid, Venous: 1.8 mmol/L (ref 0.5–1.9)

## 2021-08-20 LAB — PHOSPHORUS: Phosphorus: 5.2 mg/dL — ABNORMAL HIGH (ref 2.5–4.6)

## 2021-08-20 LAB — MAGNESIUM: Magnesium: 1.9 mg/dL (ref 1.7–2.4)

## 2021-08-20 LAB — TRIGLYCERIDES: Triglycerides: 758 mg/dL — ABNORMAL HIGH (ref <150)

## 2021-08-20 MED ORDER — PHENYLEPHRINE HCL-NACL 20-0.9 MG/250ML-% IV SOLN
0.0000 ug/min | INTRAVENOUS | Status: DC
  Administered 2021-08-20: 110 ug/min via INTRAVENOUS
  Administered 2021-08-20 (×2): 140 ug/min via INTRAVENOUS
  Filled 2021-08-20 (×3): qty 250

## 2021-08-20 MED ORDER — ORAL CARE MOUTH RINSE
15.0000 mL | OROMUCOSAL | Status: DC
  Administered 2021-08-20 (×8): 15 mL via OROMUCOSAL

## 2021-08-20 MED ORDER — LACTATED RINGERS IV BOLUS
1000.0000 mL | Freq: Once | INTRAVENOUS | Status: AC
  Administered 2021-08-20: 1000 mL via INTRAVENOUS

## 2021-08-20 NOTE — Progress Notes (Signed)
NAME:  Lonnie Hansen, MRN:  [id number], DOB:  May 29, 1950, LOS: 3 ADMISSION DATE:  08/16/2021, CONSULTATION DATE:  7/25 REFERRING MD:  Dr. 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 As part of routine lab work he was discovered to have elevated LFT and was being followed with plans for MRCP, however, in the interim he developed RUQ abdominal pain, nausea, and vomiting. CT scan in the ED showed thickening of the CBD wall and increased size AAA. Underwent MRCP which was concerning for choledocholithiasis. GI was consulted and performed ERCP on 7/25, which was initially uncomplicated, however, in the post-procedural setting the patient developed hematemesis and hematochezia. Subsequently he became diaphoretic and less responsive with ectopy on telemetry and was transferred to the ICU. PCCM was consulted for further evaluation.  Pertinent  Medical History   has a past medical history of AAA (abdominal aortic aneurysm) ([place]), Anemia, Arthritis, Asymptomatic bilateral carotid artery stenosis (08/2015), Cataracts, bilateral, Chronic ITP (idiopathic thrombocytopenia) ([place]) (03/31/2018), Coronary artery disease, Diverticulosis, Enlarged aorta ([place]), Enlarged prostate, GERD (gastroesophageal reflux disease), Glaucoma, Headache, History of colon polyps, History of kidney stones, Hyperlipidemia, Hypertension, OSA on CPAP, and Vocal cord nodule.   Significant Hospital Events: Including procedures, antibiotic start and stop dates in addition to other pertinent events   7/22 Admit  7/25 ERCP with stone removal.  EGD for active bleeding at sphincterotomy site / CBD.  To IR for embolization of GDA.  Intubated for procedure, to ICU.    Interim History / Subjective:  Tmax 100.6  RN reports pt sedate this am, sedation turned off / waking up slowly  Family at bedside  Not on  pressors.  Octreotide infusing.  RT in room, pt turned to PSV trial   Objective   Blood pressure 102/84, pulse (!) 122, temperature (!) 100.6 F (38.1 C), resp. rate 17, height '5\' 10"'$  (1.778 m), weight 85.3 kg, SpO2 100 %.    Vent Mode: PRVC FiO2 (%):  [30 %-100 %] 30 % Set Rate:  [16 bmp] 16 bmp Vt Set:  [580 mL] 580 mL PEEP:  [5 cmH20] 5 cmH20 Plateau Pressure:  [14 cmH20-19 cmH20] 19 cmH20   Intake/Output Summary (Last 24 hours) at 08/20/2021 1101 Last data filed at 08/20/2021 1000 Gross per 24 hour  Intake 5932.85 ml  Output 805 ml  Net 5127.85 ml   Filed Weights   08/16/21 1828 08/19/21 1158  Weight: 91.2 kg 85.3 kg    Examination: General: elderly adult male lying in bed in NAD on vent HEENT: MM pink/moist, ETT, mild scleral icterus, pupils equal Neuro: Awakens to voice, nods to yes/no questions then drifts back to sleep  CV: s1s2 RRR, no m/r/g PULM: non-labored at rest, lungs bilaterally clear GI: soft, bsx4 active  Extremities: warm/dry, trace LE edema  Skin: no rashes or lesions  Resolved Hospital Problem list     Assessment & Plan:   Acute gastrointestinal hemorrhage: occurred in the postprocedural setting following ERCP from site of CBD. Now s/p GDA embolization.  -stop octreotide -appreciate GI / IR  -continue PPI  -follow CBC, coags -hx of ITP, transfuse for platelets <50K.    Choledocholithiasis, cholangitis: by CT and MRCP. S/p ERCP 7/25. Hx of cholecystectomy. -appreciate GI  -stone removed  -supportive care  Hypotension: transient. Worse with valsalva when he attempts to have a bowel  movement -resolved 7/26  ITP Chronic: platelets 91 pre GIB. ABLA  -note patient has multiple antibodies  -follow CBC  -transfuse for platelets <50k   Infrarenal abdominal aortic aneurysm. Larger on CT than previously known now up to 5.3 x 4.8 cm (from 4.8 x 4.4 cm). Left internal iliac aneurysm larger now as well.  -follow up with Dr. Donzetta Matters post acute  hospitalization    OSA on CPAP -CPAP QHS once extubated   Best Practice (right click and "Reselect all SmartList Selections" daily)  Diet/type: NPO DVT prophylaxis: not indicated GI prophylaxis: PPI Lines: N/A Foley:  N/A Code Status:  full code Last date of multidisciplinary goals of care discussion: Wife updated in detail at bedside 7/26   Critical care time: 68 minutes    Noe Gens, MSN, APRN, NP-C, AGACNP-BC [facility] 08/20/2021, 2:32 PM   Please see [email or web address] for pager details.   From 7A-7P if no response, please call [phone] After hours, please call ELink [phone]

## 2021-08-20 NOTE — Addendum Note (Signed)
Addendum  created 08/20/21 1217 by Josephine Igo, MD   Clinical Note Signed

## 2021-08-20 NOTE — Progress Notes (Signed)
Initial Nutrition Assessment  DOCUMENTATION CODES:   Not applicable  INTERVENTION:  - will monitor for medical course and plans concerning nutrition.    NUTRITION DIAGNOSIS:   Inadequate oral intake related to inability to eat as evidenced by NPO status.  GOAL:   Patient will meet greater than or equal to 90% of their needs  MONITOR:   Vent status, Labs, Weight trends  REASON FOR ASSESSMENT:   Ventilator  ASSESSMENT:   71 y.o. 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 He presented to the ED due to RUQ abdominal pain and N/V. Outpatient he was being followed d/t rising LFTs and was scheduled for outpatient MRCP on 08/17/21 but due to pain he presented to ED instead. He was admitted due to elevated LFTs.  Patient intubated last night for procedure and remains intubated at this time. No OGT/NGT placed. Patient discussed in rounds this AM.   Patient is POD #1 upper GI endoscopy which indicated red blood in the entire esophagus and in the gastric fundus, gastric body, and pre-pyloric region of the stomach, blood in the second portion of duodenum, large amount of blood coming from CBD.  Patient's wife at bedside. She shares that patient had a good appetite until a few days PTA. During that time she (wife) was out of town. Patient reported abdominal pain and decreased appetite and intakes related to this. He was eating smaller portions and sometimes skipping meals. He has Ensure at home and when he skips a meal he drinks an Ensure.  Patient was on Soft diet on 7/23 and 7/24 and flow sheet documentation indicates he ate 100% of dinner on 7/23 and ate 75% of breakfast, 90% of lunch, and 100% of dinner on 7/24.  Weight yesterday  was documented as 188 lb, weight on 7/22 as 201 lb, and weight on 5/24 as 199 lb. This would indicate 11 lb weight loss (5.5% body weight) in the past 2 months; not significant for time frame.   Patient is following commands and responds to voice.    Patient is currently intubated on ventilator support MV: 13.9 L/min Temp (24hrs), Avg:98.1 F (36.7 C), Min:96.6 F (35.9 C), Max:100.6 F (38.1 C) Propofol: none BP: 107/84 and MAP: 89  Labs reviewed; K: 5.2 mmol/l, BUN: 27 mg/dl, creatinine: 1.82 mg/dl, Ca: 7.7 mg/dl, Phos: 5.2 mg/dl, Alk Phos and LFTs elevated, GFR: 39 ml/min.  Medications reviewed; 40 mg IV protonix BID.     NUTRITION - FOCUSED PHYSICAL EXAM:  Flowsheet Row Most Recent Value  Orbital Region Unable to assess  [ETT holder]  Upper Arm Region No depletion  Thoracic and Lumbar Region Unable to assess  Buccal Region Unable to assess  [ETT holder]  Temple Region No depletion  Clavicle Bone Region No depletion  Clavicle and Acromion Bone Region No depletion  Scapular Bone Region No depletion  Dorsal Hand No depletion  Patellar Region No depletion  Anterior Thigh Region No depletion  Posterior Calf Region No depletion  Edema (RD Assessment) None  Hair Reviewed  Eyes Unable to assess  Mouth Unable to assess  Skin Reviewed  Nails Reviewed       Diet Order:   Diet Order             Diet NPO time specified  Diet effective now                   EDUCATION NEEDS:   No education needs have been identified at this time  Skin:  Skin Assessment: Reviewed RN Assessment  Last BM:  7/26 (type 7 x2, one medium amount and one large amount)  Height:   Ht Readings from Last 1 Encounters:  08/19/21 5' 10"  (1.778 m)    Weight:   Wt Readings from Last 1 Encounters:  08/19/21 85.3 kg     BMI:  Body mass index is 26.98 kg/m.  Estimated Nutritional Needs:  Kcal:  2136 kcal Protein:  102-128 grams Fluid:  >/= 2.2 L/day     Lonnie Matin, MS,  RD, LDN, CNSC Registered Dietitian II Inpatient Clinical Nutrition RD pager # and on-call/weekend pager # available in [facility]

## 2021-08-20 NOTE — Transfer of Care (Signed)
Immediate Anesthesia Transfer of Care Note  Patient: Lonnie Hansen  Procedure(s) Performed: ESOPHAGOGASTRODUODENOSCOPY (EGD) HEMOSTASIS CONTROL  Patient Location: ICU  Anesthesia Type:General  Level of Consciousness: Patient remains intubated per anesthesia plan  Airway & Oxygen Therapy: Patient remains intubated per anesthesia plan and Patient placed on Ventilator (see vital sign flow sheet for setting)  Post-op Assessment: Report given to RN and Post -op Vital signs reviewed and stable  Post vital signs: Reviewed and stable  Last Vitals:  Vitals Value Taken Time  BP 93/61 08/20/21 0030  Temp    Pulse 85 08/20/21 0032  Resp 16 08/20/21 0032  SpO2 100 % 08/20/21 0032  Vitals shown include unvalidated device data.  Last Pain:  Vitals:   08/19/21 2120  TempSrc:   PainSc: 0-No pain      Patients Stated Pain Goal: 0 (68/34/19 6222)  Complications: No notable events documented.

## 2021-08-20 NOTE — Progress Notes (Signed)
[place] Progress Note Patient Name: Lonnie Hansen DOB: 02-Dec-1950 MRN: [id number]   Date of Service  08/20/2021  HPI/Events of Note  Multiple issues: 1. Review of portable CXR post intubation reveals ETT tip in satisfactory position 3 cm above the carina. 2. ABG on 100%/PRVC 16/TV 580/P 5 = 7.43/26/426/17.5. FiO2 now decreased to 40%.  eICU Interventions  Plan: ETT tip in satisfactory position.  Wean FiO2 as tolerated.      Intervention Category Major Interventions: Respiratory failure - evaluation and management  [name] 08/20/2021, 2:57 AM

## 2021-08-20 NOTE — Progress Notes (Signed)
[place] Progress Note Patient Name: Lonnie Hansen DOB: 1950-07-16 MRN: [id number]   Date of Service  08/20/2021  HPI/Events of Note  Lactic Acid = 4.7. Hg = 12.0 LVEF =- 65-70%.  eICU Interventions  Will bolus with LR 1 liter IV over 1 hour now.      Intervention Category Major Interventions: Acid-Base disturbance - evaluation and management  Amberlin Utke [place] 08/20/2021, 6:13 AM

## 2021-08-20 NOTE — Progress Notes (Signed)
[place] Progress Note Patient Name: Lonnie Hansen DOB: 1950-05-07 MRN: [id number]   Date of Service  08/20/2021  HPI/Events of Note  Patient returns from IR intubated, ventilated, sedated with a Propofol IV infusion and on a Phenylephrine IV infusion. Nursing requests Temp foley order as well.   eICU Interventions  Plan: Ventilator settings: 100%/PRVC 16/TV 580/P 5. Portable CXR STAT. ABG at 1:45 AM. Triglyceride level in AM. D/C order for Norepinephrine IV infusion via a PIV. Phenylephrine IV infusion. Titrate to MAP >= 65.     Intervention Category Major Interventions: Respiratory failure - evaluation and management  [name] 08/20/2021, 1:13 AM

## 2021-08-20 NOTE — Progress Notes (Signed)
[facility] Progress Note  CAINEN BURNHAM 71 y.o. 1950/04/13   Subjective: Patient underwent ERCP yesterday with Dr. Watt Climes for choledocholithiasis.  No active bleeding requiring treatment seen at that time.  Last night patient began to have hematemesis and red blood per rectum and became hypotensive.  He was transferred to the ICU.  Patient underwent EGD last night with Dr. Alessandra Bevels showing bright red blood in the esophagus potentially from sphincterotomy site.  IR was present during procedure and had successful embolization of GDA and right hepatic artery and any contributing inferior pancreaticoduodenal arteries.  On exam this morning patient is still sedated and intubated.  Patient continues to have dark red clots of blood per rectum.   ROS : Review of Systems  Unable to perform ROS: Intubated     Objective: Vital signs in last 24 hours: Vitals:   08/20/21 0630 08/20/21 0700  BP: 98/78 95/71  Pulse: 89 91  Resp: 18 16  Temp: 97.9 F (36.6 C)   SpO2: 99% 99%    Physical Exam:  General:  Sedated and intubated, appears stated age  Head:  Normocephalic, without obvious abnormality, atraumatic  Lungs:   Clear to auscultation bilaterally, ventilator assisted breathing  Heart:  Regular rate and rhythm, S1, S2 normal  Abdomen:   Soft, abdomen distended, non-tender, bowel sounds active all four quadrants,  no masses,   Extremities: Extremities normal, atraumatic,   Pulses: 2+ and symmetric    Lab Results: Recent Labs    08/19/21 0521 08/19/21 1551 08/20/21 0612  NA 136 137 136  K 3.4* 2.8* 5.2*  CL 101 102 105  CO2 24 21* 20*  GLUCOSE 97 165* 170*  BUN 15 17 27*  CREATININE 0.95 1.01 1.82*  CALCIUM 9.0 8.4* 7.7*  MG 2.0  --  1.9  PHOS  --   --  5.2*   Recent Labs    08/19/21 1551 08/20/21 0612  AST 223* 140*  ALT 232* 196*  ALKPHOS 270* 223*  BILITOT 3.2* 2.9*  PROT 6.5 5.5*  ALBUMIN 3.2* 2.7*   Recent Labs    08/19/21 2300 08/20/21 0308  08/20/21 0612  WBC 13.7*  --  18.2*  HGB 10.8* 12.0* 11.8*  HCT 31.9* 35.7* 35.1*  MCV 88.1  --  88.2  PLT 84*  --  112*   Recent Labs    08/19/21 1707  LABPROT 14.3  INR 1.1      Assessment Choledocholithiasis Acute GI bleed ITP  Hemoglobin stable at 11.8 after 4 units packed red blood cells last transfusion 08/19/2021.  EGD last night with Dr. Alessandra Bevels showing bright red blood in the esophagus potentially from sphincterotomy site.  IR was present during procedure and had successful embolization of GDA and right hepatic artery and any contributing inferior pancreaticoduodenal arteries.  HGB 11.8 Platelets 112 AST 140(223) ALT 196(232)  Alkphos 223(270) TBili 2.9(3.2)  LFTs downtrending after ERCP.  Hemoglobin stable at this time patient has some dark red blood clots likely from active GI bleed last night.    Plan: Continue to monitor hemoglobin, transfuse if less than 7 We will evaluate for improvement of rectal bleeding. If active GI bleed suspected recommend IR consult. Continue pantoprazole 40 mg twice daily. [facility] will follow.  Arvella Nigh Jaymar Loeber PA-C 08/20/2021, 8:31 AM  Contact #  [phone]

## 2021-08-20 NOTE — Procedures (Signed)
Extubation Procedure Note  Patient Details:   Name: Lonnie Hansen DOB: Jul 09, 1950 MRN: [id number]   Airway Documentation:  Airway 7.5 mm (Active)  Secured at (cm) 23 cm 08/20/21 1411  Measured From Lips 08/20/21 1411  [place] 08/20/21 1411  Secured By [employer] 08/20/21 1411  Tube Holder Repositioned Yes 08/20/21 1133  Prone position No 08/20/21 1411  Cuff Pressure (cm H2O) Green OR 18-26 [facility] 08/20/21 1411  Site Condition Dry;Cool 08/20/21 1411   Vent end date: (not recorded) Vent end time: (not recorded)   Evaluation  O2 sats: stable throughout Complications: No apparent complications Patient did tolerate procedure well. Bilateral Breath Sounds: Clear   Yes  Joellyn Rued 08/20/2021, 6:21 PM

## 2021-08-20 NOTE — Progress Notes (Signed)
Referring Physician(s): Brahmbhatt,P  Supervising Physician: Arne Cleveland  Patient Status:  [facility]  Chief Complaint: Recent hematemesis, rectal bleeding   Subjective: Pt remains intubated; eyes open; off pressors, temp 100, has had some dark red blood per rectum; rt fem arterial/venous sheaths intact    Allergies: Patient has no known allergies.  Medications: Prior to Admission medications   Medication Sig Start Date End Date Taking? Authorizing Provider  acetaminophen (TYLENOL) 500 MG tablet Take 1,000 mg by mouth daily as needed (pain).   Yes [provider]  amLODipine (NORVASC) 10 MG tablet Take 10 mg by mouth daily. 06/24/21  Yes [provider]  atenolol (TENORMIN) 25 MG tablet Take 12.5 mg by mouth 2 (two) times daily. 08/26/20  Yes [name], MD  fluticasone [facility]) 50 MCG/ACT nasal spray Place 2 sprays into both nostrils daily as needed for allergies or rhinitis.   Yes [provider]  latanoprost (XALATAN) 0.005 % ophthalmic solution Place 1 drop into both eyes 2 (two) times daily.   Yes [provider]  pantoprazole (PROTONIX) 40 MG tablet Take 40 mg by mouth 2 (two) times daily. 11/09/20  Yes [provider]  polyethylene glycol (MIRALAX / GLYCOLAX) 17 g packet Take 17 g by mouth daily. Patient taking differently: Take 17 g by mouth daily as needed for mild constipation. Every other day 06/04/20  Yes [name], MD  psyllium (HYDROCIL/METAMUCIL) 95 % PACK Take 1 packet by mouth daily. Patient taking differently: Take 1 packet by mouth daily as needed for mild constipation. 06/04/20  Yes [name], MD  rosuvastatin (CRESTOR) 20 MG tablet Take 20 mg by mouth daily.   Yes [provider]  triamcinolone cream (KENALOG) 0.1 % Apply 1 application. topically 3 (three) times daily as needed (facial itching/rash). 05/16/20  Yes [provider]  ferrous sulfate 325 (65 FE) MG tablet Take 325 mg  by mouth daily. Patient not taking: Reported on 07/30/2021    [provider]  Multiple Vitamin (MULTIVITAMIN WITH MINERALS) TABS tablet Take 1 tablet by mouth daily. Patient not taking: Reported on 08/17/2021    [provider]     Vital Signs: BP (!) 138/105   Pulse (!) 107   Temp 100 F (37.8 C)   Resp 18   Ht '5\' 10"'$  (1.778 m)   Wt 188 lb (85.3 kg)   SpO2 100%   BMI 26.98 kg/m   Physical Exam intubated, rt CFA/CFV sheaths intact, no obvious hematomas although nurse reports some stool has been edging towards dressing; currently not using lines  Imaging: IR Angiogram Visceral Selective  Result Date: 08/20/2021 INDICATION: 71 year old male with acute, emergent life threatening upper GI hemorrhage, after ERCP EXAM: ULTRASOUND-GUIDED ACCESS RIGHT COMMON FEMORAL ARTERY IMAGE GUIDED CENTRAL VENOUS CATHETER MESENTERIC ANGIOGRAM EMPIRIC EMBOLIZATION OF GASTRODUODENAL ARTERY EMPIRIC EMBOLIZATION OF THE REPLACED RIGHT HEPATIC ARTERY, GIVEN POSSIBLE SOURCE OF LOWER PANCREATICODUODENAL CONTRIBUTION MEDICATIONS: NONE ANESTHESIA/SEDATION: Moderate (conscious) sedation was NOT employed during this procedure. The patient was intubated under general anesthesia Total intra-service moderate Sedation Time: 0 minutes. CONTRAST:  110 cc FLUOROSCOPY: Radiation Exposure Index (as provided by the fluoroscopic device): 5456 mGy Kerma COMPLICATIONS: None PROCEDURE: Informed consent was obtained from the patient following explanation of the procedure, risks, benefits and alternatives. The patient understands, agrees and consents for the procedure. All questions were addressed. A time out was performed prior to the initiation of the procedure. Maximal barrier sterile technique utilized including caps, mask, sterile gowns, sterile  gloves, large sterile drape, hand hygiene, and Betadine prep. Ultrasound survey of the right inguinal region was performed with images stored and sent to PACs, confirming  patency of the vessel. A micropuncture needle was used access the right common femoral artery under ultrasound. With excellent arterial blood flow returned, and an .018 micro wire was passed through the needle, observed enter the abdominal aorta under fluoroscopy. The needle was removed, and a micropuncture sheath was placed over the wire. The inner dilator and wire were removed, and an 035 Bentson wire was advanced under fluoroscopy into the abdominal aorta. The sheath was removed and a standard 5 [place] vascular sheath was placed. The dilator was removed and the sheath was flushed. We then proceeded with placement of right common femoral vein central venous catheter. Ultrasound survey of the right common femoral vein was performed with images stored and sent to PACs. Ultrasound confirmed patency of the vessel. A single wall needle was used access the right common femoral vein under ultrasound. With excellent blood flow returned, a Bentson wire was passed through the needle, observed to enter the IVC under fluoroscopy. The needle was removed, and a triple-lumen central venous catheter was placed over the wire. Wire removed, and the 3 lumen on the central venous catheter were flushed and capped. Catheter was sutured in position. C2 cobra catheter was advanced on the Bentson wire through the arterial sheath, into the abdominal aorta. Wire was removed and the catheter was used to select the celiac artery. Angiogram was performed. A standard Glidewire was attempted to navigate into the common hepatic artery for further purchase given the patient's abdominal aortic aneurysm and a tentative purchase at the celiac artery origin. This flipped the catheter into the aorta, and the catheter then engage the superior mesenteric artery. Angiogram was performed. We elected to proceed with interrogation of the SMA arterial branches which may perfuse inferior pancreaticoduodenal arteries, given the patient's hemodynamic status and  urgent hemorrhage. Microcatheter and microwire, using an STC 105 cm catheter and a 14 fathom wire were navigated into the replaced right hepatic artery. Angiogram was performed. The artery was then empirically embolized, given the possibility that there was arterial contribution into inferior pancreaticoduodenal arteries supplying this region. After embolization to stasis repeat angiogram was performed. The Cobra catheter was then used to engage the celiac artery. Microwire and microcatheter were advanced through the C2 catheter into the hepatic artery. The tentative purchase at the origin did not allow microcatheter and microwire to tract within the common hepatic artery. The Cobra catheter was then removed on a Bentson wire and the 10 cm 5 French sheath was exchanged for a 35 cm 5 French sheath. C2 Cobra catheter was replaced. Again, there was poor purchase at the celiac artery origin, and the C2 cobra catheter was exchanged for a renal double curved catheter. Once the renal double curve was in position, microcatheter microwire were used in attempt to enter the common hepatic artery. The renal double curve catheter was then exchanged for a Mickelson catheter. With the [employer] catheter formed in the lower thoracic aorta, the tip was used to engage celiac artery. This proved to have enough purchase at the origin to advance the microcatheter and microwire into the common hepatic artery. Angiogram was performed. Microwire microcatheter were then advanced into the gastroduodenal artery. Angiogram was performed. The pseudoaneurysm flushed identified, and the microcatheter was advanced beyond the origin of the pancreaticoduodenal arcade that was supplying the region. Coil embolization was then performed of the entire  length of the GDA. Gel-Foam slurry was administered at the completion with stasis achieved. Microcatheter microwire were removed and a final angiogram was performed from the base catheter. Catheter and  wires were removed. We elected to then exchanged the 5 French arterial sheath for a standard 10 cm 5 French arterial sheath for arterial monitoring. Sheath was sutured in position. Sterile dressings were placed. Patient remained hemodynamically unchanged throughout the procedure, on pressor support and intubated. 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 Tortuous common hepatic artery. The gastroduodenal artery and gastroepiploic artery originate from downgoing branch of the proximal loop of the common hepatic artery. Angiogram of the SMA demonstrates replaced right hepatic artery. Attenuated arterial system in the SMA, likely related to vaso constriction/circulatory shock status Coil embolization of the replaced right hepatic artery was performed empirically, given the presence of the surgical clips in relation to the expected bleed and the possibility of pancreaticoduodenal arcade arteries from this system. 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 No flow at the conclusion. 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 Status post placement of right common femoral vein triple-lumen catheter Signed, Dulcy Fanny. Nadene Rubins, RPVI [facility] Radiology Electronically Signed   By: Corrie Mckusick D.O.   On: 08/20/2021 10:55   IR US Guide Vasc Access Right  Result Date: 08/20/2021 INDICATION: 71 year old male with acute, emergent life threatening upper GI hemorrhage, after ERCP EXAM: ULTRASOUND-GUIDED ACCESS RIGHT COMMON FEMORAL ARTERY IMAGE GUIDED CENTRAL VENOUS CATHETER MESENTERIC ANGIOGRAM EMPIRIC EMBOLIZATION OF GASTRODUODENAL ARTERY EMPIRIC EMBOLIZATION OF THE REPLACED RIGHT HEPATIC ARTERY, GIVEN POSSIBLE SOURCE OF LOWER PANCREATICODUODENAL CONTRIBUTION MEDICATIONS: NONE ANESTHESIA/SEDATION: Moderate (conscious) sedation was NOT employed during this procedure. The patient was intubated under general anesthesia Total intra-service moderate Sedation Time: 0 minutes. CONTRAST:  110 cc FLUOROSCOPY: Radiation Exposure Index (as provided by the fluoroscopic device): 9323 mGy Kerma COMPLICATIONS: None PROCEDURE: Informed consent was obtained from the patient following explanation of the procedure, risks, benefits and alternatives. The patient understands, agrees and consents for the procedure. All questions were addressed. A time out was performed prior to the initiation of the procedure. Maximal barrier sterile technique utilized including caps, mask, sterile gowns, sterile gloves, large sterile drape, hand hygiene, and Betadine prep. Ultrasound survey of the right inguinal region was performed with images stored and sent to PACs, confirming patency of the vessel. A micropuncture needle was used access the right common femoral artery under ultrasound. With excellent arterial blood flow returned, and an .018 micro wire was passed through the needle, observed enter the abdominal aorta under fluoroscopy. The needle was removed, and a micropuncture sheath was  placed over the wire. The inner dilator and wire were removed, and an 035 Bentson wire was advanced under fluoroscopy into the abdominal  aorta. The sheath was removed and a standard 5 [place] vascular sheath was placed. The dilator was removed and the sheath was flushed. We then proceeded with placement of right common femoral vein central venous catheter. Ultrasound survey of the right common femoral vein was performed with images stored and sent to PACs. Ultrasound confirmed patency of the vessel. A single wall needle was used access the right common femoral vein under ultrasound. With excellent blood flow returned, a Bentson wire was passed through the needle, observed to enter the IVC under fluoroscopy. The needle was removed, and a triple-lumen central venous catheter was placed over the wire. Wire removed, and the 3 lumen on the central venous catheter were flushed and capped. Catheter was sutured in position. C2 cobra catheter was advanced on the Bentson wire through the arterial sheath, into the abdominal aorta. Wire was removed and the catheter was used to select the celiac artery. Angiogram was performed. A standard Glidewire was attempted to navigate into the common hepatic artery for further purchase given the patient's abdominal aortic aneurysm and a tentative purchase at the celiac artery origin. This flipped the catheter into the aorta, and the catheter then engage the superior mesenteric artery. Angiogram was performed. We elected to proceed with interrogation of the SMA arterial branches which may perfuse inferior pancreaticoduodenal arteries, given the patient's hemodynamic status and urgent hemorrhage. Microcatheter and microwire, using an STC 105 cm catheter and a 14 fathom wire were navigated into the replaced right hepatic artery. Angiogram was performed. The artery was then empirically embolized, given the possibility that there was arterial contribution into inferior pancreaticoduodenal  arteries supplying this region. After embolization to stasis repeat angiogram was performed. The Cobra catheter was then used to engage the celiac artery. Microwire and microcatheter were advanced through the C2 catheter into the hepatic artery. The tentative purchase at the origin did not allow microcatheter and microwire to tract within the common hepatic artery. The Cobra catheter was then removed on a Bentson wire and the 10 cm 5 French sheath was exchanged for a 35 cm 5 French sheath. C2 Cobra catheter was replaced. Again, there was poor purchase at the celiac artery origin, and the C2 cobra catheter was exchanged for a renal double curved catheter. Once the renal double curve was in position, microcatheter microwire were used in attempt to enter the common hepatic artery. The renal double curve catheter was then exchanged for a Mickelson catheter. With the [facility] catheter formed in the lower thoracic aorta, the tip was used to engage celiac artery. This proved to have enough purchase at the origin to advance the microcatheter and microwire into the common hepatic artery. Angiogram was performed. Microwire microcatheter were then advanced into the gastroduodenal artery. Angiogram was performed. The pseudoaneurysm flushed identified, and the microcatheter was advanced beyond the origin of the pancreaticoduodenal arcade that was supplying the region. Coil embolization was then performed of the entire length of the GDA. Gel-Foam slurry was administered at the completion with stasis achieved. Microcatheter microwire were removed and a final angiogram was performed from the base catheter. Catheter and wires were removed. We elected to then exchanged the 5 French arterial sheath for a standard 10 cm 5 French arterial sheath for arterial monitoring. Sheath was sutured in position. Sterile dressings were placed. Patient remained hemodynamically unchanged throughout the procedure, on pressor support and intubated.  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 Tortuous common hepatic artery. The gastroduodenal artery and gastroepiploic artery originate from downgoing branch of the proximal loop of the common hepatic artery. Angiogram of the SMA demonstrates replaced right hepatic artery. Attenuated arterial system in the SMA, likely related to vaso constriction/circulatory shock status Coil embolization of the replaced right hepatic artery was performed empirically, given the presence of the surgical clips in relation to the expected bleed and the possibility of pancreaticoduodenal arcade arteries from this system. 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 No flow at the conclusion. 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 Status post placement of right common femoral vein triple-lumen catheter Signed, Dulcy Fanny. Nadene Rubins, RPVI [facility] Radiology Electronically Signed   By: Corrie Mckusick D.O.   On: 08/20/2021 10:55   IR Angiogram Selective Each Additional  Vessel  Result Date: 08/20/2021 INDICATION: 71 year old male with acute, emergent life threatening upper GI hemorrhage, after ERCP EXAM: ULTRASOUND-GUIDED ACCESS RIGHT COMMON FEMORAL ARTERY IMAGE GUIDED CENTRAL VENOUS CATHETER MESENTERIC ANGIOGRAM EMPIRIC EMBOLIZATION OF GASTRODUODENAL ARTERY EMPIRIC EMBOLIZATION OF THE REPLACED RIGHT HEPATIC ARTERY, GIVEN POSSIBLE SOURCE OF LOWER PANCREATICODUODENAL CONTRIBUTION MEDICATIONS: NONE ANESTHESIA/SEDATION: Moderate (conscious) sedation was NOT employed during this procedure. The patient was intubated under general anesthesia Total intra-service moderate Sedation Time: 0 minutes. CONTRAST:  110 cc FLUOROSCOPY: Radiation Exposure Index (as provided by the fluoroscopic device): 6578 mGy Kerma COMPLICATIONS: None PROCEDURE: Informed consent was obtained from the patient following explanation of the procedure, risks, benefits and alternatives. The patient understands, agrees and consents for the procedure. All questions were addressed. A time out was performed prior to the initiation of the procedure. Maximal barrier sterile technique utilized including caps, mask, sterile gowns, sterile gloves, large sterile drape, hand hygiene, and Betadine prep. Ultrasound survey of the right inguinal region was performed with images stored and sent to PACs, confirming patency of the vessel. A micropuncture needle was used access the right common femoral artery under ultrasound. With excellent arterial blood flow returned, and an .018 micro wire was passed through the needle, observed enter the abdominal aorta under fluoroscopy. The needle was removed, and a micropuncture sheath was placed over the wire. The inner dilator and wire were removed, and an 035 Bentson wire was advanced under fluoroscopy into the abdominal aorta. The sheath was removed and a standard 5 [place] vascular sheath was placed. The dilator was removed and the sheath was flushed. We then proceeded with placement of  right common femoral vein central venous catheter. Ultrasound survey of the right common femoral vein was performed with images stored and sent to PACs. Ultrasound confirmed patency of the vessel. A single wall needle was used access the right common femoral vein under ultrasound. With excellent blood flow returned, a Bentson wire was passed through the needle, observed to enter the IVC under fluoroscopy. The needle was removed, and a  triple-lumen central venous catheter was placed over the wire. Wire removed, and the 3 lumen on the central venous catheter were flushed and capped. Catheter was sutured in position. C2 cobra catheter was advanced on the Bentson wire through the arterial sheath, into the abdominal aorta. Wire was removed and the catheter was used to select the celiac artery. Angiogram was performed. A standard Glidewire was attempted to navigate into the common hepatic artery for further purchase given the patient's abdominal aortic aneurysm and a tentative purchase at the celiac artery origin. This flipped the catheter into the aorta, and the catheter then engage the superior mesenteric artery. Angiogram was performed. We elected to proceed with interrogation of the SMA arterial branches which may perfuse inferior pancreaticoduodenal arteries, given the patient's hemodynamic status and urgent hemorrhage. Microcatheter and microwire, using an STC 105 cm catheter and a 14 fathom wire were navigated into the replaced right hepatic artery. Angiogram was performed. The artery was then empirically embolized, given the possibility that there was arterial contribution into inferior pancreaticoduodenal arteries supplying this region. After embolization to stasis repeat angiogram was performed. The Cobra catheter was then used to engage the celiac artery. Microwire and microcatheter were advanced through the C2 catheter into the hepatic artery. The tentative purchase at the origin did not allow microcatheter  and microwire to tract within the common hepatic artery. The Cobra catheter was then removed on a Bentson wire and the 10 cm 5 French sheath was exchanged for a 35 cm 5 French sheath. C2 Cobra catheter was replaced. Again, there was poor purchase at the celiac artery origin, and the C2 cobra catheter was exchanged for a renal double curved catheter. Once the renal double curve was in position, microcatheter microwire were used in attempt to enter the common hepatic artery. The renal double curve catheter was then exchanged for a Mickelson catheter. With the [facility] catheter formed in the lower thoracic aorta, the tip was used to engage celiac artery. This proved to have enough purchase at the origin to advance the microcatheter and microwire into the common hepatic artery. Angiogram was performed. Microwire microcatheter were then advanced into the gastroduodenal artery. Angiogram was performed. The pseudoaneurysm flushed identified, and the microcatheter was advanced beyond the origin of the pancreaticoduodenal arcade that was supplying the region. Coil embolization was then performed of the entire length of the GDA. Gel-Foam slurry was administered at the completion with stasis achieved. Microcatheter microwire were removed and a final angiogram was performed from the base catheter. Catheter and wires were removed. We elected to then exchanged the 5 French arterial sheath for a standard 10 cm 5 French arterial sheath for arterial monitoring. Sheath was sutured in position. Sterile dressings were placed. Patient remained hemodynamically unchanged throughout the procedure, on pressor support and intubated. Approximately 25 cc blood loss No complications encountered FINDINGS: Ultrasound of the right inguinal region demonstrates patent common femoral vein Ultrasound of the right inguinal region demonstrates patent common femoral artery Tip of the triple-lumen catheter terminates in the right iliac vein Celiac artery  angiogram demonstrates ectasia of the celiac trunk, with origin identified of the splenic artery, common hepatic artery, and left gastric artery. Tortuous common hepatic artery. The gastroduodenal artery and gastroepiploic artery originate from downgoing branch of the proximal loop of the common hepatic artery. Angiogram of the SMA demonstrates replaced right hepatic artery. Attenuated arterial system in the SMA, likely related to vaso constriction/circulatory shock status Coil embolization of the replaced right hepatic artery was performed empirically,  given the presence of the surgical clips in relation to the expected bleed and the possibility of pancreaticoduodenal arcade arteries from this system. 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 No flow at the conclusion. 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 Status post placement of right common femoral vein triple-lumen catheter Signed, Dulcy Fanny. Nadene Rubins, RPVI [facility] Radiology Electronically Signed   By: Corrie Mckusick D.O.   On: 08/20/2021 10:55   IR EMBO ART  VEN HEMORR LYMPH EXTRAV  INC GUIDE ROADMAPPING  Result Date: 08/20/2021 INDICATION: 71 year old male with acute, emergent life threatening upper GI hemorrhage, after ERCP EXAM: ULTRASOUND-GUIDED ACCESS RIGHT COMMON FEMORAL ARTERY IMAGE GUIDED CENTRAL VENOUS CATHETER MESENTERIC ANGIOGRAM EMPIRIC EMBOLIZATION OF GASTRODUODENAL ARTERY  EMPIRIC EMBOLIZATION OF THE REPLACED RIGHT HEPATIC ARTERY, GIVEN POSSIBLE SOURCE OF LOWER PANCREATICODUODENAL CONTRIBUTION MEDICATIONS: NONE ANESTHESIA/SEDATION: Moderate (conscious) sedation was NOT employed during this procedure. The patient was intubated under general anesthesia Total intra-service moderate Sedation Time: 0 minutes. CONTRAST:  110 cc FLUOROSCOPY: Radiation Exposure Index (as provided by the fluoroscopic device): 3086 mGy Kerma COMPLICATIONS: None PROCEDURE: Informed consent was obtained from the patient following explanation of the procedure, risks, benefits and alternatives. The patient understands, agrees and consents for the procedure. All questions were addressed. A time out was performed prior to the initiation of the procedure. Maximal barrier sterile technique utilized including caps, mask, sterile gowns, sterile gloves, large sterile drape, hand hygiene, and Betadine prep. Ultrasound survey of the right inguinal region was performed with images stored and sent to PACs, confirming patency of the vessel. A micropuncture needle was used access the right common femoral artery under ultrasound. With excellent arterial blood flow returned, and an .018 micro wire was passed through the needle, observed enter the abdominal aorta under fluoroscopy. The needle was removed, and a micropuncture sheath was placed over the wire. The inner dilator and wire were removed, and an 035 Bentson wire was advanced under fluoroscopy into the abdominal aorta. The sheath was removed and a standard 5 [place] vascular sheath was placed. The dilator was removed and the sheath was flushed. We then proceeded with placement of right common femoral vein central venous catheter. Ultrasound survey of the right common femoral vein was performed with images stored and sent to PACs. Ultrasound confirmed patency of the vessel. A single wall needle was used access the right common femoral vein under ultrasound. With excellent  blood flow returned, a Bentson wire was passed through the needle, observed to enter the IVC under fluoroscopy. The needle was removed, and a triple-lumen central venous catheter was placed over the wire. Wire removed, and the 3 lumen on the central venous catheter were flushed and capped. Catheter was sutured in position. C2 cobra catheter was advanced on the Bentson wire through the arterial sheath, into the abdominal aorta. Wire was removed and the catheter was used to select the celiac artery. Angiogram was performed. A standard Glidewire was attempted to navigate into the common hepatic artery for further purchase given the patient's abdominal aortic aneurysm and a tentative purchase at the celiac artery origin. This  flipped the catheter into the aorta, and the catheter then engage the superior mesenteric artery. Angiogram was performed. We elected to proceed with interrogation of the SMA arterial branches which may perfuse inferior pancreaticoduodenal arteries, given the patient's hemodynamic status and urgent hemorrhage. Microcatheter and microwire, using an STC 105 cm catheter and a 14 fathom wire were navigated into the replaced right hepatic artery. Angiogram was performed. The artery was then empirically embolized, given the possibility that there was arterial contribution into inferior pancreaticoduodenal arteries supplying this region. After embolization to stasis repeat angiogram was performed. The Cobra catheter was then used to engage the celiac artery. Microwire and microcatheter were advanced through the C2 catheter into the hepatic artery. The tentative purchase at the origin did not allow microcatheter and microwire to tract within the common hepatic artery. The Cobra catheter was then removed on a Bentson wire and the 10 cm 5 French sheath was exchanged for a 35 cm 5 French sheath. C2 Cobra catheter was replaced. Again, there was poor purchase at the celiac artery origin, and the C2 cobra  catheter was exchanged for a renal double curved catheter. Once the renal double curve was in position, microcatheter microwire were used in attempt to enter the common hepatic artery. The renal double curve catheter was then exchanged for a Mickelson catheter. With the [facility] catheter formed in the lower thoracic aorta, the tip was used to engage celiac artery. This proved to have enough purchase at the origin to advance the microcatheter and microwire into the common hepatic artery. Angiogram was performed. Microwire microcatheter were then advanced into the gastroduodenal artery. Angiogram was performed. The pseudoaneurysm flushed identified, and the microcatheter was advanced beyond the origin of the pancreaticoduodenal arcade that was supplying the region. Coil embolization was then performed of the entire length of the GDA. Gel-Foam slurry was administered at the completion with stasis achieved. Microcatheter microwire were removed and a final angiogram was performed from the base catheter. Catheter and wires were removed. We elected to then exchanged the 5 French arterial sheath for a standard 10 cm 5 French arterial sheath for arterial monitoring. Sheath was sutured in position. Sterile dressings were placed. Patient remained hemodynamically unchanged throughout the procedure, on pressor support and intubated. 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 Tortuous common hepatic artery. The gastroduodenal artery and gastroepiploic artery originate from downgoing branch of the proximal loop of the  common hepatic artery. Angiogram of the SMA demonstrates replaced right hepatic artery. Attenuated arterial system in the SMA, likely related to vaso constriction/circulatory shock status Coil embolization of the replaced right hepatic artery was performed empirically, given the presence of the surgical clips in relation to the expected bleed and the possibility of pancreaticoduodenal arcade arteries from this system. 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 No flow at the conclusion. 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 Status post placement of right common femoral vein triple-lumen catheter Signed, Dulcy Fanny. Nadene Rubins, RPVI V[facility] and Interventional Radiology Specialists [facility] Radiology Electronically Signed   By: Corrie Mckusick D.O.   On: 08/20/2021 10:55   IR Angiogram Selective Each Additional Vessel  Result Date: 08/20/2021 INDICATION: 70 year old male with acute, emergent life threatening upper GI hemorrhage, after ERCP EXAM: ULTRASOUND-GUIDED ACCESS RIGHT COMMON FEMORAL ARTERY IMAGE GUIDED CENTRAL VENOUS CATHETER MESENTERIC ANGIOGRAM EMPIRIC EMBOLIZATION OF GASTRODUODENAL ARTERY EMPIRIC EMBOLIZATION OF THE REPLACED RIGHT HEPATIC ARTERY, GIVEN POSSIBLE SOURCE OF LOWER PANCREATICODUODENAL CONTRIBUTION MEDICATIONS: NONE ANESTHESIA/SEDATION: Moderate (conscious) sedation was NOT employed during this procedure. The patient was intubated under general anesthesia Total intra-service  moderate Sedation Time: 0 minutes. CONTRAST:  110 cc FLUOROSCOPY: Radiation Exposure Index (as provided by the fluoroscopic device): 0973 mGy Kerma COMPLICATIONS: None PROCEDURE: Informed consent was obtained from the patient following explanation of the procedure, risks, benefits and alternatives. The patient understands, agrees and consents for the procedure. All questions were addressed. A time out was performed prior to the initiation of the procedure. Maximal barrier sterile technique utilized including caps, mask, sterile gowns, sterile gloves, large sterile drape, hand hygiene, and Betadine prep. Ultrasound survey of the right inguinal region was performed with images stored and sent to PACs, confirming patency of the vessel. A micropuncture needle was used access the right common femoral artery under ultrasound. With excellent arterial blood flow returned, and an .018 micro wire was passed through the needle, observed enter the abdominal aorta under fluoroscopy. The needle was removed, and a micropuncture sheath was placed over the wire. The inner dilator and wire were removed, and an 035 Bentson wire was advanced under fluoroscopy into the abdominal aorta. The sheath was removed and a standard 5 [place] vascular sheath was placed. The dilator was removed and the sheath was flushed. We then proceeded with placement of right common femoral vein central venous catheter. Ultrasound survey of the right common femoral vein was performed with images stored and sent to PACs. Ultrasound confirmed patency of the vessel. A single wall needle was used access the right common femoral vein under ultrasound. With excellent blood flow returned, a Bentson wire was passed through the needle, observed to enter the IVC under fluoroscopy. The needle was removed, and a triple-lumen central venous catheter was placed over the wire. Wire removed, and the 3 lumen on the central venous catheter were flushed and capped. Catheter was  sutured in position. C2 cobra catheter was advanced on the Bentson wire through the arterial sheath, into the abdominal aorta. Wire was removed and the catheter was used to select the celiac artery. Angiogram was performed. A standard Glidewire was attempted to navigate into the common hepatic artery for further purchase given the patient's abdominal aortic aneurysm and a tentative purchase at the celiac artery origin. This flipped the catheter into the aorta, and the catheter then engage the superior mesenteric artery. Angiogram was performed. We elected to proceed with interrogation of the SMA arterial branches which may perfuse inferior pancreaticoduodenal arteries, given the patient's hemodynamic status and urgent hemorrhage. Microcatheter and microwire, using an STC 105 cm catheter and a 14 fathom wire were navigated into the replaced right hepatic artery. Angiogram was performed. The artery was then empirically embolized, given the possibility that there was arterial contribution into inferior pancreaticoduodenal arteries supplying this region. After embolization to stasis repeat angiogram was performed. The [employer]  catheter was then used to engage the celiac artery. Microwire and microcatheter were advanced through the C2 catheter into the hepatic artery. The tentative purchase at the origin did not allow microcatheter and microwire to tract within the common hepatic artery. The Cobra catheter was then removed on a Bentson wire and the 10 cm 5 French sheath was exchanged for a 35 cm 5 French sheath. C2 Cobra catheter was replaced. Again, there was poor purchase at the celiac artery origin, and the C2 cobra catheter was exchanged for a renal double curved catheter. Once the renal double curve was in position, microcatheter microwire were used in attempt to enter the common hepatic artery. The renal double curve catheter was then exchanged for a Mickelson catheter. With the [facility] catheter formed in the lower  thoracic aorta, the tip was used to engage celiac artery. This proved to have enough purchase at the origin to advance the microcatheter and microwire into the common hepatic artery. Angiogram was performed. Microwire microcatheter were then advanced into the gastroduodenal artery. Angiogram was performed. The pseudoaneurysm flushed identified, and the microcatheter was advanced beyond the origin of the pancreaticoduodenal arcade that was supplying the region. Coil embolization was then performed of the entire length of the GDA. Gel-Foam slurry was administered at the completion with stasis achieved. Microcatheter microwire were removed and a final angiogram was performed from the base catheter. Catheter and wires were removed. We elected to then exchanged the 5 French arterial sheath for a standard 10 cm 5 French arterial sheath for arterial monitoring. Sheath was sutured in position. Sterile dressings were placed. Patient remained hemodynamically unchanged throughout the procedure, on pressor support and intubated. 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 Tortuous common hepatic artery. The gastroduodenal artery and gastroepiploic artery originate from downgoing branch of the proximal loop of the common hepatic artery. Angiogram of the SMA demonstrates replaced right hepatic artery. Attenuated arterial system in the SMA, likely related to vaso constriction/circulatory shock status Coil embolization of the replaced right hepatic artery was performed empirically, given the presence of the surgical clips  in relation to the expected bleed and the possibility of pancreaticoduodenal arcade arteries from this system. 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 No flow at the conclusion. 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 Status post placement of right common femoral vein triple-lumen catheter Signed, Dulcy Fanny. Nadene Rubins, RPVI Vascular and Interventional Radiology Specialists [facility] Radiology Electronically Signed   By: Corrie Mckusick D.O.   On: 08/20/2021 10:55   IR US Guide Vasc Access Right  Result Date: 08/20/2021 INDICATION: 71 year old male with acute, emergent life threatening upper GI hemorrhage, after ERCP EXAM: ULTRASOUND-GUIDED ACCESS RIGHT COMMON FEMORAL ARTERY IMAGE GUIDED CENTRAL VENOUS CATHETER MESENTERIC ANGIOGRAM EMPIRIC EMBOLIZATION OF GASTRODUODENAL ARTERY EMPIRIC EMBOLIZATION OF  THE REPLACED RIGHT HEPATIC ARTERY, GIVEN POSSIBLE SOURCE OF LOWER PANCREATICODUODENAL CONTRIBUTION MEDICATIONS: NONE ANESTHESIA/SEDATION: Moderate (conscious) sedation was NOT employed during this procedure. The patient was intubated under general anesthesia Total intra-service moderate Sedation Time: 0 minutes. CONTRAST:  110 cc FLUOROSCOPY: Radiation Exposure Index (as provided by the fluoroscopic device): 4270 mGy Kerma COMPLICATIONS: None PROCEDURE: Informed consent was obtained from the patient following explanation of the procedure, risks, benefits and alternatives. The patient understands, agrees  and consents for the procedure. All questions were addressed. A time out was performed prior to the initiation of the procedure. Maximal barrier sterile technique utilized including caps, mask, sterile gowns, sterile gloves, large sterile drape, hand hygiene, and Betadine prep. Ultrasound survey of the right inguinal region was performed with images stored and sent to PACs, confirming patency of the vessel. A micropuncture needle was used access the right common femoral artery under ultrasound. With excellent arterial blood flow returned, and an .018 micro wire was passed through the needle, observed enter the abdominal aorta under fluoroscopy. The needle was removed, and a micropuncture sheath was placed over the wire. The inner dilator and wire were removed, and an 035 Bentson wire was advanced under fluoroscopy into the abdominal aorta. The sheath was removed and a standard 5 [place] vascular sheath was placed. The dilator was removed and the sheath was flushed. We then proceeded with placement of right common femoral vein central venous catheter. Ultrasound survey of the right common femoral vein was performed with images stored and sent to PACs. Ultrasound confirmed patency of the vessel. A single wall needle was used access the right common femoral vein under ultrasound. With excellent blood flow returned, a Bentson wire was passed through the needle, observed to enter the IVC under fluoroscopy. The needle was removed, and a triple-lumen central venous catheter was placed over the wire. Wire removed, and the 3 lumen on the central venous catheter were flushed and capped. Catheter was sutured in position. C2 cobra catheter was advanced on the Bentson wire through the arterial sheath, into the abdominal aorta. Wire was removed and the catheter was used to select the celiac artery. Angiogram was performed. A standard Glidewire was attempted to navigate into the common hepatic artery for further purchase given the  patient's abdominal aortic aneurysm and a tentative purchase at the celiac artery origin. This flipped the catheter into the aorta, and the catheter then engage the superior mesenteric artery. Angiogram was performed. We elected to proceed with interrogation of the SMA arterial branches which may perfuse inferior pancreaticoduodenal arteries, given the patient's hemodynamic status and urgent hemorrhage. Microcatheter and microwire, using an STC 105 cm catheter and a 14 fathom wire were navigated into the replaced right hepatic artery. Angiogram was performed. The artery was then empirically embolized, given the possibility that there was arterial contribution into inferior pancreaticoduodenal arteries supplying this region. After embolization to stasis repeat angiogram was performed. The Cobra catheter was then used to engage the celiac artery. Microwire and microcatheter were advanced through the C2 catheter into the hepatic artery. The tentative purchase at the origin did not allow microcatheter and microwire to tract within the common hepatic artery. The Cobra catheter was then removed on a Bentson wire and the 10 cm 5 French sheath was exchanged for a 35 cm 5 French sheath. C2 Cobra catheter was replaced. Again, there was poor purchase at the celiac artery origin, and the C2 cobra catheter was exchanged for a renal double curved catheter. Once the renal double  curve was in position, microcatheter microwire were used in attempt to enter the common hepatic artery. The renal double curve catheter was then exchanged for a Mickelson catheter. With the [facility] catheter formed in the lower thoracic aorta, the tip was used to engage celiac artery. This proved to have enough purchase at the origin to advance the microcatheter and microwire into the common hepatic artery. Angiogram was performed. Microwire microcatheter were then advanced into the gastroduodenal artery. Angiogram was performed. The pseudoaneurysm  flushed identified, and the microcatheter was advanced beyond the origin of the pancreaticoduodenal arcade that was supplying the region. Coil embolization was then performed of the entire length of the GDA. Gel-Foam slurry was administered at the completion with stasis achieved. Microcatheter microwire were removed and a final angiogram was performed from the base catheter. Catheter and wires were removed. We elected to then exchanged the 5 French arterial sheath for a standard 10 cm 5 French arterial sheath for arterial monitoring. Sheath was sutured in position. Sterile dressings were placed. Patient remained hemodynamically unchanged throughout the procedure, on pressor support and intubated. 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 Tortuous common hepatic artery. The gastroduodenal artery and gastroepiploic artery originate from downgoing branch of the proximal loop of the common hepatic artery. Angiogram of the SMA demonstrates replaced right hepatic artery. Attenuated arterial system in the SMA, likely related to vaso constriction/circulatory shock status Coil embolization of the replaced right hepatic artery was performed empirically, given the presence of the surgical clips in relation to the expected bleed and the possibility of pancreaticoduodenal arcade arteries from this system. The patient has patent portal vein on prior CT Angiogram of the gastroduodenal artery demonstrates the pseudoaneurysm originating from superior pancreaticoduodenal branches with active extravasation observed (X a  series 14) Coil embolization was performed along the GDA to stasis. No flow at the conclusion. 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 Status post placement of right common femoral vein triple-lumen catheter Signed, Dulcy Fanny. Nadene Rubins, RPVI Vascular and Interventional Radiology Specialists [facility] Radiology Electronically Signed   By: Corrie Mckusick D.O.   On: 08/20/2021 10:55   DG CHEST PORT 1 VIEW  Result Date: 08/20/2021 CLINICAL DATA:  Intubated EXAM: PORTABLE CHEST 1 VIEW COMPARISON:  CTA chest dated 06/18/2021 FINDINGS: Right basilar atelectasis. Left lung is clear. No pleural effusion or pneumothorax. Endotracheal tube terminates 3 cm above the carina. Heart is normal in size.  Prosthetic aortic valve. Median sternotomy. IMPRESSION: Endotracheal tube terminates 3 cm above the carina. Electronically Signed   By: Julian Hy M.D.   On: 08/20/2021 02:19   CT ANGIO GI BLEED  Addendum Date: 08/19/2021   ADDENDUM REPORT: 08/19/2021 20:39 ADDENDUM: These results were called by telephone at the time of interpretation on 08/19/2021 at 8:35 pm to provider Dr Oletta Darter, who verbally acknowledged these results. Electronically Signed   By: Julian Hy M.D.   On: 08/19/2021 20:39   Result Date: 08/19/2021 CLINICAL DATA:  Lower  GI bleed EXAM: CTA ABDOMEN AND PELVIS WITHOUT AND WITH CONTRAST TECHNIQUE: Multidetector CT imaging of the abdomen and pelvis was performed using the standard protocol during bolus administration of intravenous contrast. Multiplanar reconstructed images and MIPs were obtained and reviewed to evaluate the vascular anatomy. RADIATION DOSE REDUCTION: This exam was performed according to the departmental  dose-optimization program which includes automated exposure control, adjustment of the mA and/or kV according to patient size and/or use of iterative reconstruction technique. CONTRAST:  168m OMNIPAQUE IOHEXOL 350 MG/ML SOLN COMPARISON:  MRI abdomen dated 08/17/2021. CT abdomen/pelvis dated 08/16/2021. FINDINGS: VASCULAR Aorta: 4.9 x 5.1 cm infrarenal abdominal aortic aneurysm. Atherosclerotic calcifications of the aortic arch. Patent. Celiac: Patent. SMA: Patent. Renals: Patent bilaterally. IMA: Patent. Inflow: Patent bilaterally. Atherosclerotic calcifications. 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 3.7 cm short axis left common iliac artery aneurysm. Proximal Outflow: Patent. Veins: Unremarkable. Active intravasation of contrast within the lumen of the 2nd portion of the duodenum (series 5/images 91 and 94). This reflects the site of the patient's active GI bleeding. Associated findings described below. Review of the MIP images confirms the above findings. NON-VASCULAR Lower chest: Lung bases are clear.  Prosthetic aortic valve. Hepatobiliary: Subcentimeter hepatic cysts. Mild intrahepatic and extrahepatic ductal dilatation. Hyperdense material within the common duct (series 7/image 31), possibly postprocedural versus hemorrhage. Pancreas: Within normal limits. Spleen: Within normal limits. Adrenals/Urinary Tract: Adrenal glands are within normal limits. Bilateral renal cysts, most of which are simple, including a 5.0 cm right lower pole renal sinus cyst (series 7/image 34). Additional 2.1 cm lateral left upper pole cyst with calcified septations (series 2/image 30), but no enhancement following contrast administration, benign (Bosniak II). No renal calculi or hydronephrosis. Bladder is within normal limits. Stomach/Bowel: Enteric tube terminates in the distal gastric body. 4.3 x 6.3 cm hematoma expanding the 1st and 2nd portion of the duodenum (series 2/image 38) and an adjacent duodenal  diverticulum (coronal image 68). This corresponds to the site of active GI bleeding identified above. Hyperdense material/hemorrhage extends into the 3rd portion of the duodenum (series 2/image 42). No evidence of bowel obstruction. Normal appendix (series 5/image 120). Extensive sigmoid diverticulosis, without evidence of diverticulitis. Lymphatic: Small upper abdominal lymph nodes. No suspicious abdominopelvic lymphadenopathy. Reproductive: Prostate is unremarkable. Other: No abdominopelvic ascites. Musculoskeletal: Mild degenerative changes the lumbar spine. Median sternotomy. IMPRESSION: Active GI bleeding within the 2nd portion the duodenum. Associated large hematoma. Hyperdense material in the common duct, possibly postprocedural versus hemorrhage given additional findings. Associated mild intrahepatic and extrahepatic ductal dilatation. 5.1 cm infrarenal abdominal aortic aneurysm. 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 Consider vascular surgery consultation. Aortic Atherosclerosis (ICD10-I70.0). Electronically Signed: By: SJulian HyM.D. On: 08/19/2021 20:25   DG Abd 1 View  Result Date: 08/19/2021 CLINICAL DATA:  Encounter for nasogastric tube placement. EXAM: ABDOMEN - 1 VIEW COMPARISON:  CT abdomen and pelvis 08/16/2021 FINDINGS: AP portable view of the abdomen was obtained. Nasogastric tube extends into the abdomen and the tip is in the gastric body region. Evidence for cholecystectomy clips. Evidence for embolization coils in the upper pelvis. Nonobstructive bowel gas pattern. Severe joint space loss along the superior right hip joint with remodeling in the right femoral head. IMPRESSION: 1. Nasogastric tube tip in the stomach body region. 2. Severe joint space loss and degenerative changes in the right hip. Electronically Signed   By: AMarkus DaftM.D.   On: 08/19/2021 16:20   DG ERCP  Result Date: 08/19/2021  CLINICAL DATA:  Suspected choledocholithiasis EXAM:  ERCP TECHNIQUE: Multiple spot images obtained with the fluoroscopic device and submitted for interpretation post-procedure. COMPARISON:  MRCP 08/17/2021 FINDINGS: Series of fluoroscopic spot images document endoscopic cannulation and opacification of the CBD. Filling defects are noted in the distal CBD consistent with choledocholithiasis. Subsequent image demonstrates balloon catheter placement into the common bile duct. The intrahepatic ducts are incompletely visualized, mildly dilated centrally. Cholecystectomy clips. No extravasation evident. IMPRESSION: Endoscopic CBD cannulation and intervention as above. These images were submitted for radiologic interpretation only. Please see the procedural report for the amount of contrast and the fluoroscopy time utilized. Electronically Signed   By: Lucrezia Europe M.D.   On: 08/19/2021 14:16   MR ABDOMEN MRCP W WO CONTAST  Result Date: 08/17/2021 CLINICAL DATA:  Biliary obstruction suspected EXAM: MRI ABDOMEN WITHOUT AND WITH CONTRAST (INCLUDING MRCP) TECHNIQUE: Multiplanar multisequence MR imaging of the abdomen was performed both before and after the administration of intravenous contrast. Heavily T2-weighted images of the biliary and pancreatic ducts were obtained, and three-dimensional MRCP images were rendered by post processing. CONTRAST:  83m GADAVIST GADOBUTROL 1 MMOL/ML IV SOLN COMPARISON:  CT abdomen pelvis, 08/16/2021 FINDINGS: Lower chest: No acute findings. Hepatobiliary: No mass or other parenchymal abnormality identified. Status post cholecystectomy. Suspect at least one gallstone in the central common bile duct near the ampulla measuring 0.6 cm (series 12, image 19, series 11, image 21). No significant biliary ductal dilatation, however there is periportal edema (series 3, image 19) Pancreas: No mass, inflammatory changes, or other parenchymal abnormality identified.No pancreatic ductal dilatation. Spleen:  Within normal limits in size and appearance.  Adrenals/Urinary Tract: Normal adrenal glands. Multiple bilateral renal cortical and parapelvic cysts. No solid mass or suspicious contrast enhancement. Heterogeneous, septated cyst of the superior pole of the left kidney, which is calcified on comparison CT (series 17, image 21). No renal masses or suspicious contrast enhancement identified. No evidence of hydronephrosis. Stomach/Bowel: Descending colonic diverticulosis. Visualized portions within the abdomen are otherwise unremarkable. Vascular/Lymphatic: No pathologically enlarged lymph nodes identified. Partially imaged abdominal aortic aneurysm measuring 4.9 x 4.7 cm (series 12, image 20). Other:  None. Musculoskeletal: No suspicious osseous lesions identified. IMPRESSION: 1. Suspect at least one gallstone in the central common bile duct near the ampulla measuring 0.6 cm. No significant biliary ductal dilatation, however there is periportal edema. Findings are concerning for choledocholithiasis status post cholecystectomy. 2. Partially imaged abdominal aortic aneurysm measuring 4.9 x 4.7 cm. 3. Descending colonic diverticulosis. These results will be called to the ordering clinician or representative by the Radiologist Assistant, and communication documented in the PACS or [employer] Electronically Signed   By: ADelanna AhmadiM.D.   On: 08/17/2021 12:10   MR 3D Recon At Scanner  Result Date: 08/17/2021 CLINICAL DATA:  Biliary obstruction suspected EXAM: MRI ABDOMEN WITHOUT AND WITH CONTRAST (INCLUDING MRCP) TECHNIQUE: Multiplanar multisequence MR imaging of the abdomen was performed both before and after the administration of intravenous contrast. Heavily T2-weighted images of the biliary and pancreatic ducts were obtained, and three-dimensional MRCP images were rendered by post processing. CONTRAST:  967mGADAVIST GADOBUTROL 1 MMOL/ML IV SOLN COMPARISON:  CT abdomen pelvis, 08/16/2021 FINDINGS: Lower chest: No acute findings. Hepatobiliary: No mass or  other parenchymal abnormality identified. Status post cholecystectomy. Suspect at least one gallstone in the central common bile duct near the ampulla measuring 0.6 cm (series 12, image 19, series 11, image 21). No significant biliary ductal dilatation, however there is periportal edema (series 3,  image 19) Pancreas: No mass, inflammatory changes, or other parenchymal abnormality identified.No pancreatic ductal dilatation. Spleen:  Within normal limits in size and appearance. Adrenals/Urinary Tract: Normal adrenal glands. Multiple bilateral renal cortical and parapelvic cysts. No solid mass or suspicious contrast enhancement. Heterogeneous, septated cyst of the superior pole of the left kidney, which is calcified on comparison CT (series 17, image 21). No renal masses or suspicious contrast enhancement identified. No evidence of hydronephrosis. Stomach/Bowel: Descending colonic diverticulosis. Visualized portions within the abdomen are otherwise unremarkable. Vascular/Lymphatic: No pathologically enlarged lymph nodes identified. Partially imaged abdominal aortic aneurysm measuring 4.9 x 4.7 cm (series 12, image 20). Other:  None. Musculoskeletal: No suspicious osseous lesions identified. IMPRESSION: 1. Suspect at least one gallstone in the central common bile duct near the ampulla measuring 0.6 cm. No significant biliary ductal dilatation, however there is periportal edema. Findings are concerning for choledocholithiasis status post cholecystectomy. 2. Partially imaged abdominal aortic aneurysm measuring 4.9 x 4.7 cm. 3. Descending colonic diverticulosis. These results will be called to the ordering clinician or representative by the Radiologist Assistant, and communication documented in the PACS or [employer]. Electronically Signed   By: Delanna Ahmadi M.D.   On: 08/17/2021 12:10   CT Abdomen Pelvis W Contrast  Result Date: 08/16/2021 CLINICAL DATA:  Abdominal pain, acute, nonlocalized ruq abd pain. right  upper quadrant pain , pt has no gall bladder, has a mrcp tomorrow at [facility]. EXAM: CT ABDOMEN AND PELVIS WITH CONTRAST TECHNIQUE: Multidetector CT imaging of the abdomen and pelvis was performed using the standard protocol following bolus administration of intravenous contrast. RADIATION DOSE REDUCTION: This exam was performed according to the departmental dose-optimization program which includes automated exposure control, adjustment of the mA and/or kV according to patient size and/or use of iterative reconstruction technique. CONTRAST:  38m OMNIPAQUE IOHEXOL 300 MG/ML  SOLN COMPARISON:  CT abdomen pelvis angiography 05/11/2021 FINDINGS: Lower chest: Elevated left hemidiaphragm with passive atelectasis of the left lower lobe. Otherwise no acute abnormality. Partially visualized aortic valve replacement. Possible tiny hiatal hernia. Hepatobiliary: Several scattered subcentimeter hypodensities too small to characterize. Otherwise no focal liver abnormality. Status post cholecystectomy. No biliary dilatation. Query slightly enhancement/thickening of the common bile duct wall. Pancreas: No focal lesion. Normal pancreatic contour. No surrounding inflammatory changes. No main pancreatic ductal dilatation. Spleen: Normal in size without focal abnormality. Adrenals/Urinary Tract: No adrenal nodule bilaterally. Bilateral kidneys enhance symmetrically. Simple free fluid parapelvic cyst on the right. Other simple fluid densities within the kidneys likely represent simple renal cysts. Simple renal cysts, in the absence of clinically indicated signs/symptoms, require no independent follow-up. Redemonstration of a 1.3 cm left renal calcification. No hydronephrosis. No hydroureter. The urinary bladder is unremarkable. Stomach/Bowel: Stomach is within normal limits. No evidence of bowel wall thickening or dilatation. Diffuse colonic diverticulosis. Appendix appears normal. Vascular/Lymphatic: Interval increase of an  infrarenal abdominal aorta aneurysm measuring up to 5.3 x 4.8 cm (from 4.8 x 4.4 cm) with finding extending for approximately 6 cm in the craniocaudal dimension and extending to the aortic bifurcation. 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 Mild atherosclerotic plaque of the aorta and its branches. No abdominal, pelvic, or inguinal lymphadenopathy. Reproductive: Prostate is unremarkable. Other: No intraperitoneal free fluid. No intraperitoneal free gas. No organized fluid collection. Musculoskeletal: No abdominal wall hernia or abnormality. No suspicious lytic or blastic osseous lesions. No  acute displaced fracture. Multilevel degenerative changes of the spine. 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 IMPRESSION: 1. Interval increase of an infrarenal abdominal aorta aneurysm measuring up to 5.3 x 4.8 cm (from 4.8 x 4.4 cm). Recommend referral to a vascular specialist. This recommendation follows ACR consensus guidelines: White Paper of the ACR Incidental Findings Committee II on Vascular Findings. J Am Coll Radiol 2013; 10:789-794. 2. 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 3. 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 4. Status post cholecystectomy. Query slightly enhancement/thickening of the common bile duct wall. 5. Possible tiny hiatal hernia. 6. Diffuse colonic diverticulosis with no acute diverticulitis. 7. Nonobstructive left nephrolithiasis measuring up to 1.3 cm. 8. 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 Electronically Signed   By: Iven Finn M.D.   On: 08/16/2021 20:41    Labs:  CBC: Recent Labs    08/19/21 0521 08/19/21 1551 08/19/21 2300 08/20/21 0308 08/20/21 0612 08/20/21 1258  WBC 9.2 15.8* 13.7*  --   18.2*  --   HGB 11.8* 10.8* 10.8* 12.0* 11.8* 10.7*  HCT 39.5 33.7* 31.9* 35.7* 35.1* 31.7*  PLT 91* 129* 84*  --  112*  --     COAGS: Recent Labs    12/19/20 0047 05/26/21 0650 08/19/21 1707  INR 1.1 1.2 1.1  APTT  --  23*  --     BMP: Recent Labs    08/18/21 0534 08/19/21 0521 08/19/21 1551 08/20/21 0612  NA 136 136 137 136  K 3.2* 3.4* 2.8* 5.2*  CL 99 101 102 105  CO2 26 24 21* 20*  GLUCOSE 102* 97 165* 170*  BUN '14 15 17 '$ 27*  CALCIUM 9.3 9.0 8.4* 7.7*  CREATININE 1.06 0.95 1.01 1.82*  GFRNONAA >60 >60 >60 39*    LIVER FUNCTION TESTS: Recent Labs    08/18/21 0534 08/19/21 0521 08/19/21 1551 08/20/21 0612  BILITOT 1.8* 1.6* 3.2* 2.9*  AST 205* 112* 223* 140*  ALT 316* 215* 232* 196*  ALKPHOS 230* 213* 270* 223*  PROT 7.8 7.2 6.5 5.5*  ALBUMIN 4.1 3.5 3.2* 2.7*    Assessment and Plan: Pt with hx choledocholithiasis, acute GI bleed post ERCP with BRB in esophagus on EGD from ? 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 Rowe Robert, PA-C 08/20/2021, 4:29 PM   I spent a total of 15 minutes at the the patient's bedside AND on the patient's hospital floor or unit, greater than 50% of which was counseling/coordinating care for mesenteric arteriogram with embolization    Patient ID: Lonnie Hansen, male   DOB: 03-06-1950, 71 y.o.   MRN: [id number]

## 2021-08-21 ENCOUNTER — Inpatient Hospital Stay: Payer: Self-pay

## 2021-08-21 ENCOUNTER — Encounter (HOSPITAL_COMMUNITY): Payer: Self-pay | Admitting: Certified Registered"

## 2021-08-21 DIAGNOSIS — E875 Hyperkalemia: Secondary | ICD-10-CM | POA: Diagnosis not present

## 2021-08-21 DIAGNOSIS — D62 Acute posthemorrhagic anemia: Secondary | ICD-10-CM

## 2021-08-21 DIAGNOSIS — K8309 Other cholangitis: Secondary | ICD-10-CM

## 2021-08-21 LAB — COMPREHENSIVE METABOLIC PANEL
ALT: 153 U/L — ABNORMAL HIGH (ref 0–44)
AST: 82 U/L — ABNORMAL HIGH (ref 15–41)
Albumin: 2.8 g/dL — ABNORMAL LOW (ref 3.5–5.0)
Alkaline Phosphatase: 146 U/L — ABNORMAL HIGH (ref 38–126)
Anion gap: 8 (ref 5–15)
BUN: 39 mg/dL — ABNORMAL HIGH (ref 8–23)
CO2: 23 mmol/L (ref 22–32)
Calcium: 7.8 mg/dL — ABNORMAL LOW (ref 8.9–10.3)
Chloride: 109 mmol/L (ref 98–111)
Creatinine, Ser: 1.46 mg/dL — ABNORMAL HIGH (ref 0.61–1.24)
GFR, Estimated: 51 mL/min — ABNORMAL LOW (ref >60)
Glucose, Bld: 139 mg/dL — ABNORMAL HIGH (ref 70–99)
Potassium: 4.1 mmol/L (ref 3.5–5.1)
Sodium: 140 mmol/L (ref 135–145)
Total Bilirubin: 1.4 mg/dL — ABNORMAL HIGH (ref 0.3–1.2)
Total Protein: 5.5 g/dL — ABNORMAL LOW (ref 6.5–8.1)

## 2021-08-21 LAB — GLUCOSE, CAPILLARY
Glucose-Capillary: 107 mg/dL — ABNORMAL HIGH (ref 70–99)
Glucose-Capillary: 115 mg/dL — ABNORMAL HIGH (ref 70–99)
Glucose-Capillary: 120 mg/dL — ABNORMAL HIGH (ref 70–99)
Glucose-Capillary: 134 mg/dL — ABNORMAL HIGH (ref 70–99)
Glucose-Capillary: 142 mg/dL — ABNORMAL HIGH (ref 70–99)
Glucose-Capillary: 99 mg/dL (ref 70–99)

## 2021-08-21 LAB — CBC
HCT: 27.9 % — ABNORMAL LOW (ref 39.0–52.0)
HCT: 25.5 % — ABNORMAL LOW (ref 39.0–52.0)
Hemoglobin: 9.2 g/dL — ABNORMAL LOW (ref 13.0–17.0)
Hemoglobin: 8.2 g/dL — ABNORMAL LOW (ref 13.0–17.0)
MCH: 29.6 pg (ref 26.0–34.0)
MCH: 29.9 pg (ref 26.0–34.0)
MCHC: 33 g/dL (ref 30.0–36.0)
MCHC: 32.2 g/dL (ref 30.0–36.0)
MCV: 89.7 fL (ref 80.0–100.0)
MCV: 93.1 fL (ref 80.0–100.0)
Platelets: 101 10*3/uL — ABNORMAL LOW (ref 150–400)
Platelets: 102 10*3/uL — ABNORMAL LOW (ref 150–400)
RBC: 3.11 MIL/uL — ABNORMAL LOW (ref 4.22–5.81)
RBC: 2.74 MIL/uL — ABNORMAL LOW (ref 4.22–5.81)
RDW: 16.4 % — ABNORMAL HIGH (ref 11.5–15.5)
RDW: 16.4 % — ABNORMAL HIGH (ref 11.5–15.5)
WBC: 17.1 10*3/uL — ABNORMAL HIGH (ref 4.0–10.5)
WBC: 16.1 10*3/uL — ABNORMAL HIGH (ref 4.0–10.5)
nRBC: 0 % (ref 0.0–0.2)
nRBC: 0.1 % (ref 0.0–0.2)

## 2021-08-21 LAB — PROTIME-INR
INR: 1.1 (ref 0.8–1.2)
Prothrombin Time: 14.5 seconds (ref 11.4–15.2)

## 2021-08-21 LAB — LACTIC ACID, PLASMA: Lactic Acid, Venous: 1.3 mmol/L (ref 0.5–1.9)

## 2021-08-21 MED ORDER — ORAL CARE MOUTH RINSE: 15.0000 mL | OROMUCOSAL | Status: DC | PRN

## 2021-08-21 NOTE — Progress Notes (Signed)
Attempted to insert PICC line. L arm veins too small for PICC. Unable to thread PICC centrally on RUA x 2 attempts. Primary RN Norman Clay notified to contact MD for other options.

## 2021-08-21 NOTE — Progress Notes (Signed)
Referring Physician(s): Brahmbhatt,P  Supervising Physician: Markus Daft  Patient Status:  [facility] - In-pt  Chief Complaint:  Recent hematemesis, rectal bleeding  Subjective: Pt now extubated, talkative; states he is hungry and thirsty; denies abd pain,N/V; has had some dark colored stools per nurse, no BRB   Allergies: Patient has no known allergies.  Medications: Prior to Admission medications   Medication Sig Start Date End Date Taking? Authorizing Provider  acetaminophen (TYLENOL) 500 MG tablet Take 1,000 mg by mouth daily as needed (pain).   Yes [provider]  amLODipine (NORVASC) 10 MG tablet Take 10 mg by mouth daily. 06/24/21  Yes [provider]  atenolol (TENORMIN) 25 MG tablet Take 12.5 mg by mouth 2 (two) times daily. 08/26/20  Yes [name], MD  fluticasone [facility]) 50 MCG/ACT nasal spray Place 2 sprays into both nostrils daily as needed for allergies or rhinitis.   Yes [provider]  latanoprost (XALATAN) 0.005 % ophthalmic solution Place 1 drop into both eyes 2 (two) times daily.   Yes [provider]  pantoprazole (PROTONIX) 40 MG tablet Take 40 mg by mouth 2 (two) times daily. 11/09/20  Yes [provider]  polyethylene glycol (MIRALAX / GLYCOLAX) 17 g packet Take 17 g by mouth daily. Patient taking differently: Take 17 g by mouth daily as needed for mild constipation. Every other day 06/04/20  Yes [name], MD  psyllium (HYDROCIL/METAMUCIL) 95 % PACK Take 1 packet by mouth daily. Patient taking differently: Take 1 packet by mouth daily as needed for mild constipation. 06/04/20  Yes [name], MD  rosuvastatin (CRESTOR) 20 MG tablet Take 20 mg by mouth daily.   Yes [provider]  triamcinolone cream (KENALOG) 0.1 % Apply 1 application. topically 3 (three) times daily as needed (facial itching/rash). 05/16/20  Yes [provider]  ferrous sulfate 325 (65 FE) MG tablet Take 325 mg by mouth  daily. Patient not taking: Reported on 07/30/2021    [provider]  Multiple Vitamin (MULTIVITAMIN WITH MINERALS) TABS tablet Take 1 tablet by mouth daily. Patient not taking: Reported on 08/17/2021    [provider]     Vital Signs: BP 116/71   Pulse 90   Temp 99 F (37.2 C)   Resp 16   Ht '5\' 10"'$  (1.778 m)   Wt 188 lb (85.3 kg)   SpO2 100%   BMI 26.98 kg/m   Physical Exam awake/alert; abd soft,NT; puncture sites rt CFA/CFV soft, NT, no discrete hematoma; rt foot warm , pulses+/-  Imaging: IR Fluoro Guide CV Line Right  Result Date: 08/20/2021 INDICATION: 71 year old male with acute, emergent life threatening upper GI hemorrhage, after ERCP EXAM: ULTRASOUND-GUIDED ACCESS RIGHT COMMON FEMORAL ARTERY IMAGE GUIDED CENTRAL VENOUS CATHETER MESENTERIC ANGIOGRAM EMPIRIC EMBOLIZATION OF GASTRODUODENAL ARTERY EMPIRIC EMBOLIZATION OF THE REPLACED RIGHT HEPATIC ARTERY, GIVEN POSSIBLE SOURCE OF LOWER PANCREATICODUODENAL CONTRIBUTION MEDICATIONS: NONE ANESTHESIA/SEDATION: Moderate (conscious) sedation was NOT employed during this procedure. The patient was intubated under general anesthesia Total intra-service moderate Sedation Time: 0 minutes. CONTRAST:  110 cc FLUOROSCOPY: Radiation Exposure Index (as provided by the fluoroscopic device): 6213 mGy Kerma COMPLICATIONS: None PROCEDURE: Informed consent was obtained from the patient following explanation of the procedure, risks, benefits and alternatives. The patient understands, agrees and consents for the procedure. All questions were addressed. A time out was performed prior to the initiation of the procedure. Maximal barrier sterile technique utilized including caps, mask, sterile gowns, sterile gloves, large sterile drape, hand  hygiene, and Betadine prep. Ultrasound survey of the right inguinal region was performed with images stored and sent to PACs, confirming patency of the vessel. A micropuncture needle was used access the right  common femoral artery under ultrasound. With excellent arterial blood flow returned, and an .018 micro wire was passed through the needle, observed enter the abdominal aorta under fluoroscopy. The needle was removed, and a micropuncture sheath was placed over the wire. The inner dilator and wire were removed, and an 035 Bentson wire was advanced under fluoroscopy into the abdominal aorta. The sheath was removed and a standard 5 [place] vascular sheath was placed. The dilator was removed and the sheath was flushed. We then proceeded with placement of right common femoral vein central venous catheter. Ultrasound survey of the right common femoral vein was performed with images stored and sent to PACs. Ultrasound confirmed patency of the vessel. A single wall needle was used access the right common femoral vein under ultrasound. With excellent blood flow returned, a Bentson wire was passed through the needle, observed to enter the IVC under fluoroscopy. The needle was removed, and a triple-lumen central venous catheter was placed over the wire. Wire removed, and the 3 lumen on the central venous catheter were flushed and capped. Catheter was sutured in position. C2 cobra catheter was advanced on the Bentson wire through the arterial sheath, into the abdominal aorta. Wire was removed and the catheter was used to select the celiac artery. Angiogram was performed. A standard Glidewire was attempted to navigate into the common hepatic artery for further purchase given the patient's abdominal aortic aneurysm and a tentative purchase at the celiac artery origin. This flipped the catheter into the aorta, and the catheter then engage the superior mesenteric artery. Angiogram was performed. We elected to proceed with interrogation of the SMA arterial branches which may perfuse inferior pancreaticoduodenal arteries, given the patient's hemodynamic status and urgent hemorrhage. Microcatheter and microwire, using an STC 105 cm  catheter and a 14 fathom wire were navigated into the replaced right hepatic artery. Angiogram was performed. The artery was then empirically embolized, given the possibility that there was arterial contribution into inferior pancreaticoduodenal arteries supplying this region. After embolization to stasis repeat angiogram was performed. The Cobra catheter was then used to engage the celiac artery. Microwire and microcatheter were advanced through the C2 catheter into the hepatic artery. The tentative purchase at the origin did not allow microcatheter and microwire to tract within the common hepatic artery. The Cobra catheter was then removed on a Bentson wire and the 10 cm 5 French sheath was exchanged for a 35 cm 5 French sheath. C2 Cobra catheter was replaced. Again, there was poor purchase at the celiac artery origin, and the C2 cobra catheter was exchanged for a renal double curved catheter. Once the renal double curve was in position, microcatheter microwire were used in attempt to enter the common hepatic artery. The renal double curve catheter was then exchanged for a Mickelson catheter. With the [facility] catheter formed in the lower thoracic aorta, the tip was used to engage celiac artery. This proved to have enough purchase at the origin to advance the microcatheter and microwire into the common hepatic artery. Angiogram was performed. Microwire microcatheter were then advanced into the gastroduodenal artery. Angiogram was performed. The pseudoaneurysm flushed identified, and the microcatheter was advanced beyond the origin of the pancreaticoduodenal arcade that was supplying the region. Coil embolization was then performed of the entire length of the GDA. Gel-Foam  slurry was administered at the completion with stasis achieved. Microcatheter microwire were removed and a final angiogram was performed from the base catheter. Catheter and wires were removed. We elected to then exchanged the 5 French arterial  sheath for a standard 10 cm 5 French arterial sheath for arterial monitoring. Sheath was sutured in position. Sterile dressings were placed. Patient remained hemodynamically unchanged throughout the procedure, on pressor support and intubated. 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 Tortuous common hepatic artery. The gastroduodenal artery and gastroepiploic artery originate from downgoing branch of the proximal loop of the common hepatic artery. Angiogram of the SMA demonstrates replaced right hepatic artery. Attenuated arterial system in the SMA, likely related to vaso constriction/circulatory shock status Coil embolization of the replaced right hepatic artery was performed empirically, given the presence of the surgical clips in relation to the expected bleed and the possibility of pancreaticoduodenal arcade arteries from this system. 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 No flow at the conclusion. 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 Status post placement of right common femoral vein  triple-lumen catheter Signed, Dulcy Fanny. Nadene Rubins, RPVI [facility] Radiology Electronically Signed   By: Corrie Mckusick D.O.   On: 08/20/2021 19:07   IR Angiogram Visceral Selective  Result Date: 08/20/2021 INDICATION: 71 year old male with acute, emergent life threatening upper GI hemorrhage, after ERCP EXAM: ULTRASOUND-GUIDED ACCESS RIGHT COMMON FEMORAL ARTERY IMAGE GUIDED CENTRAL VENOUS CATHETER MESENTERIC ANGIOGRAM EMPIRIC EMBOLIZATION OF GASTRODUODENAL ARTERY EMPIRIC EMBOLIZATION OF THE REPLACED RIGHT HEPATIC ARTERY, GIVEN POSSIBLE SOURCE OF LOWER PANCREATICODUODENAL CONTRIBUTION MEDICATIONS: NONE ANESTHESIA/SEDATION: Moderate (conscious) sedation was NOT employed during this procedure. The patient was intubated under general anesthesia Total intra-service moderate Sedation Time: 0 minutes. CONTRAST:  110 cc FLUOROSCOPY: Radiation Exposure Index (as provided by the fluoroscopic device): 4098 mGy Kerma COMPLICATIONS: None PROCEDURE: Informed consent was obtained from the patient following explanation of the procedure, risks, benefits and alternatives. The patient understands, agrees and consents for the procedure. All questions were addressed. A time out was performed prior to the initiation of the procedure. Maximal barrier sterile technique utilized including caps, mask, sterile gowns, sterile gloves, large sterile drape, hand hygiene, and Betadine prep. Ultrasound survey of the right inguinal region was performed with images stored and sent to PACs, confirming patency of the vessel. A micropuncture needle was used access the right common femoral artery under ultrasound. With excellent arterial blood flow returned, and an .018 micro wire was passed through the needle, observed enter the abdominal aorta under fluoroscopy. The needle was removed, and a micropuncture sheath was placed over the wire. The inner dilator and wire were removed, and an 035  Bentson wire was advanced under fluoroscopy into the abdominal aorta. The sheath was removed and a  standard 5 [place] vascular sheath was placed. The dilator was removed and the sheath was flushed. We then proceeded with placement of right common femoral vein central venous catheter. Ultrasound survey of the right common femoral vein was performed with images stored and sent to PACs. Ultrasound confirmed patency of the vessel. A single wall needle was used access the right common femoral vein under ultrasound. With excellent blood flow returned, a Bentson wire was passed through the needle, observed to enter the IVC under fluoroscopy. The needle was removed, and a triple-lumen central venous catheter was placed over the wire. Wire removed, and the 3 lumen on the central venous catheter were flushed and capped. Catheter was sutured in position. C2 cobra catheter was advanced on the Bentson wire through the arterial sheath, into the abdominal aorta. Wire was removed and the catheter was used to select the celiac artery. Angiogram was performed. A standard Glidewire was attempted to navigate into the common hepatic artery for further purchase given the patient's abdominal aortic aneurysm and a tentative purchase at the celiac artery origin. This flipped the catheter into the aorta, and the catheter then engage the superior mesenteric artery. Angiogram was performed. We elected to proceed with interrogation of the SMA arterial branches which may perfuse inferior pancreaticoduodenal arteries, given the patient's hemodynamic status and urgent hemorrhage. Microcatheter and microwire, using an STC 105 cm catheter and a 14 fathom wire were navigated into the replaced right hepatic artery. Angiogram was performed. The artery was then empirically embolized, given the possibility that there was arterial contribution into inferior pancreaticoduodenal arteries supplying this region. After embolization to stasis repeat angiogram  was performed. The Cobra catheter was then used to engage the celiac artery. Microwire and microcatheter were advanced through the C2 catheter into the hepatic artery. The tentative purchase at the origin did not allow microcatheter and microwire to tract within the common hepatic artery. The Cobra catheter was then removed on a Bentson wire and the 10 cm 5 French sheath was exchanged for a 35 cm 5 French sheath. C2 Cobra catheter was replaced. Again, there was poor purchase at the celiac artery origin, and the C2 cobra catheter was exchanged for a renal double curved catheter. Once the renal double curve was in position, microcatheter microwire were used in attempt to enter the common hepatic artery. The renal double curve catheter was then exchanged for a Mickelson catheter. With the [facility] catheter formed in the lower thoracic aorta, the tip was used to engage celiac artery. This proved to have enough purchase at the origin to advance the microcatheter and microwire into the common hepatic artery. Angiogram was performed. Microwire microcatheter were then advanced into the gastroduodenal artery. Angiogram was performed. The pseudoaneurysm flushed identified, and the microcatheter was advanced beyond the origin of the pancreaticoduodenal arcade that was supplying the region. Coil embolization was then performed of the entire length of the GDA. Gel-Foam slurry was administered at the completion with stasis achieved. Microcatheter microwire were removed and a final angiogram was performed from the base catheter. Catheter and wires were removed. We elected to then exchanged the 5 French arterial sheath for a standard 10 cm 5 French arterial sheath for arterial monitoring. Sheath was sutured in position. Sterile dressings were placed. Patient remained hemodynamically unchanged throughout the procedure, on pressor support and intubated. 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 Tortuous common hepatic artery. The gastroduodenal artery and gastroepiploic artery originate from downgoing branch of the proximal loop of the common hepatic artery. Angiogram of the SMA demonstrates replaced right hepatic artery. Attenuated arterial system in the SMA, likely related to vaso constriction/circulatory shock status Coil embolization of the replaced right hepatic artery was performed empirically, given the presence of the surgical clips in relation to the expected bleed and the possibility of pancreaticoduodenal arcade arteries from this system. 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 No flow at the conclusion. 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 Status post placement of right common femoral vein triple-lumen catheter Signed, Dulcy Fanny. Nadene Rubins, RPVI [facility]iology [facility] Radiology Electronically Signed   By: Corrie Mckusick D.O.   On: 08/20/2021 10:55   IR US Guide Vasc Access Right  Result Date: 08/20/2021 INDICATION: 71 year old male with acute, emergent life  threatening upper GI hemorrhage, after ERCP EXAM: ULTRASOUND-GUIDED ACCESS RIGHT COMMON FEMORAL ARTERY IMAGE GUIDED CENTRAL VENOUS CATHETER MESENTERIC ANGIOGRAM EMPIRIC EMBOLIZATION OF GASTRODUODENAL ARTERY EMPIRIC EMBOLIZATION OF THE REPLACED RIGHT HEPATIC ARTERY, GIVEN POSSIBLE SOURCE OF LOWER PANCREATICODUODENAL CONTRIBUTION MEDICATIONS: NONE ANESTHESIA/SEDATION: Moderate (conscious) sedation was NOT employed during this procedure. The patient was intubated under general anesthesia Total intra-service moderate Sedation Time: 0 minutes. CONTRAST:  110 cc FLUOROSCOPY: Radiation Exposure Index (as provided by the fluoroscopic device): 4536 mGy Kerma COMPLICATIONS: None PROCEDURE: Informed consent was obtained from the patient following explanation of the procedure, risks, benefits and alternatives. The patient understands, agrees and consents for the procedure. All questions were addressed. A time out was performed prior to the initiation of the procedure. Maximal barrier sterile technique utilized including caps, mask, sterile gowns, sterile gloves, large sterile drape, hand hygiene, and Betadine prep. Ultrasound survey of the right inguinal region was performed with images stored and sent to PACs, confirming patency of the vessel. A micropuncture needle was used access the right common femoral artery under ultrasound. With excellent arterial blood flow returned, and an .018 micro wire was passed through the needle, observed enter the abdominal aorta under fluoroscopy. The needle was removed, and a micropuncture sheath was placed over the wire. The inner dilator and wire were removed, and an 035 Bentson wire was advanced under fluoroscopy into the abdominal aorta. The sheath was removed and a standard 5 [place] vascular sheath was placed. The dilator was removed and the sheath was flushed. We then proceeded with placement of right common femoral vein central venous catheter. Ultrasound survey of the right common  femoral vein was performed with images stored and sent to PACs. Ultrasound confirmed patency of the vessel. A single wall needle was used access the right common femoral vein under ultrasound. With excellent blood flow returned, a Bentson wire was passed through the needle, observed to enter the IVC under fluoroscopy. The needle was removed, and a triple-lumen central venous catheter was placed over  the wire. Wire removed, and the 3 lumen on the central venous catheter were flushed and capped. Catheter was sutured in position. C2 cobra catheter was advanced on the Bentson wire through the arterial sheath, into the abdominal aorta. Wire was removed and the catheter was used to select the celiac artery. Angiogram was performed. A standard Glidewire was attempted to navigate into the common hepatic artery for further purchase given the patient's abdominal aortic aneurysm and a tentative purchase at the celiac artery origin. This flipped the catheter into the aorta, and the catheter then engage the superior mesenteric artery. Angiogram was performed. We elected to proceed with interrogation of the SMA arterial branches which may perfuse inferior pancreaticoduodenal arteries, given the patient's hemodynamic status and urgent hemorrhage. Microcatheter and microwire, using an STC 105 cm catheter and a 14 fathom wire were navigated into the replaced right hepatic artery. Angiogram was performed. The artery was then empirically embolized, given the possibility that there was arterial contribution into inferior pancreaticoduodenal arteries supplying this region. After embolization to stasis repeat angiogram was performed. The Cobra catheter was then used to engage the celiac artery. Microwire and microcatheter were advanced through the C2 catheter into the hepatic artery. The tentative purchase at the origin did not allow microcatheter and microwire to tract within the common hepatic artery. The Cobra catheter was then  removed on a Bentson wire and the 10 cm 5 French sheath was exchanged for a 35 cm 5 French sheath. C2 Cobra catheter was replaced. Again, there was poor purchase at the celiac artery origin, and the C2 cobra catheter was exchanged for a renal double curved catheter. Once the renal double curve was in position, microcatheter microwire were used in attempt to enter the common hepatic artery. The renal double curve catheter was then exchanged for a Mickelson catheter. With the [facility] catheter formed in the lower thoracic aorta, the tip was used to engage celiac artery. This proved to have enough purchase at the origin to advance the microcatheter and microwire into the common hepatic artery. Angiogram was performed. Microwire microcatheter were then advanced into the gastroduodenal artery. Angiogram was performed. The pseudoaneurysm flushed identified, and the microcatheter was advanced beyond the origin of the pancreaticoduodenal arcade that was supplying the region. Coil embolization was then performed of the entire length of the GDA. Gel-Foam slurry was administered at the completion with stasis achieved. Microcatheter microwire were removed and a final angiogram was performed from the base catheter. Catheter and wires were removed. We elected to then exchanged the 5 French arterial sheath for a standard 10 cm 5 French arterial sheath for arterial monitoring. Sheath was sutured in position. Sterile dressings were placed. Patient remained hemodynamically unchanged throughout the procedure, on pressor support and intubated. 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 Tortuous common hepatic artery. The gastroduodenal artery and gastroepiploic artery originate from downgoing branch of the proximal loop of the common hepatic artery. Angiogram of the SMA demonstrates replaced right hepatic artery. Attenuated arterial system in the SMA, likely related to vaso constriction/circulatory shock status Coil embolization of the replaced right hepatic artery was performed empirically, given the presence of the surgical clips  in relation to the expected bleed and the possibility of pancreaticoduodenal arcade arteries from this system. 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 No flow at the conclusion. 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 Status post placement of right common femoral vein triple-lumen catheter Signed, Dulcy Fanny. Nadene Rubins, RPVI [facility] Radiology Electronically Signed   By: Corrie Mckusick D.O.   On: 08/20/2021 10:55   IR Angiogram Selective Each Additional Vessel  Result Date: 08/20/2021 INDICATION: 71 year old male with acute, emergent life threatening upper GI hemorrhage, after ERCP EXAM: ULTRASOUND-GUIDED ACCESS RIGHT COMMON FEMORAL ARTERY IMAGE GUIDED CENTRAL VENOUS CATHETER MESENTERIC ANGIOGRAM EMPIRIC EMBOLIZATION OF GASTRODUODENAL ARTERY EMPIRIC EMBOLIZATION OF THE REPLACED RIGHT HEPATIC ARTERY, GIVEN POSSIBLE SOURCE OF LOWER  PANCREATICODUODENAL CONTRIBUTION MEDICATIONS: NONE ANESTHESIA/SEDATION: Moderate (conscious) sedation was NOT employed during this procedure. The patient was intubated under general anesthesia Total intra-service moderate Sedation Time: 0 minutes. CONTRAST:  110 cc FLUOROSCOPY: Radiation Exposure Index (as provided by the fluoroscopic device): 7628 mGy Kerma COMPLICATIONS: None PROCEDURE: Informed consent was obtained from the patient following explanation of the procedure, risks, benefits and alternatives. The patient understands, agrees and consents for the procedure. All questions were addressed. A time out was performed prior to the initiation of the procedure. Maximal barrier sterile technique utilized including caps, mask, sterile gowns, sterile gloves, large sterile drape, hand hygiene, and Betadine prep. Ultrasound survey of the right inguinal region was performed with images stored and sent to PACs, confirming patency of the vessel. A micropuncture needle was used access the right common femoral artery under ultrasound. With excellent arterial blood flow returned, and an .018 micro wire was passed through the needle, observed enter the abdominal aorta under fluoroscopy. The needle was removed, and a micropuncture sheath was placed over the wire. The inner dilator and wire were removed, and an 035 Bentson wire was advanced under fluoroscopy into the abdominal aorta. The sheath was removed and a standard 5 [place] vascular sheath was placed. The dilator was removed and the sheath was flushed. We then proceeded with placement of right common femoral vein central venous catheter. Ultrasound survey of the right common femoral vein was performed with images stored and sent to PACs. Ultrasound confirmed patency of the vessel. A single wall needle was used access the right common femoral vein under ultrasound. With excellent blood flow returned, a Bentson wire was passed through the needle, observed to enter the IVC  under fluoroscopy. The needle was removed, and a triple-lumen central venous catheter was placed over the wire. Wire removed, and the 3 lumen on the central venous catheter were flushed and capped. Catheter was sutured in position. C2 cobra catheter was advanced on the Bentson wire through the arterial sheath, into the abdominal aorta. Wire was removed and the catheter was used to select the celiac artery. Angiogram was performed. A standard Glidewire was attempted to navigate into the common hepatic artery for further purchase given the patient's abdominal aortic aneurysm and a tentative purchase at the celiac artery origin. This flipped the catheter into the aorta, and the catheter then engage the superior  mesenteric artery. Angiogram was performed. We elected to proceed with interrogation of the SMA arterial branches which may perfuse inferior pancreaticoduodenal arteries, given the patient's hemodynamic status and urgent hemorrhage. Microcatheter and microwire, using an STC 105 cm catheter and a 14 fathom wire were navigated into the replaced right hepatic artery. Angiogram was performed. The artery was then empirically embolized, given the possibility that there was arterial contribution into inferior pancreaticoduodenal arteries supplying this region. After embolization to stasis repeat angiogram was performed. The Cobra catheter was then used to engage the celiac artery. Microwire and microcatheter were advanced through the C2 catheter into the hepatic artery. The tentative purchase at the origin did not allow microcatheter and microwire to tract within the common hepatic artery. The Cobra catheter was then removed on a Bentson wire and the 10 cm 5 French sheath was exchanged for a 35 cm 5 French sheath. C2 Cobra catheter was replaced. Again, there was poor purchase at the celiac artery origin, and the C2 cobra catheter was exchanged for a renal double curved catheter. Once the renal double curve was in  position, microcatheter microwire were used in attempt to enter the common hepatic artery. The renal double curve catheter was then exchanged for a Mickelson catheter. With the [facility] catheter formed in the lower thoracic aorta, the tip was used to engage celiac artery. This proved to have enough purchase at the origin to advance the microcatheter and microwire into the common hepatic artery. Angiogram was performed. Microwire microcatheter were then advanced into the gastroduodenal artery. Angiogram was performed. The pseudoaneurysm flushed identified, and the microcatheter was advanced beyond the origin of the pancreaticoduodenal arcade that was supplying the region. Coil embolization was then performed of the entire length of the GDA. Gel-Foam slurry was administered at the completion with stasis achieved. Microcatheter microwire were removed and a final angiogram was performed from the base catheter. Catheter and wires were removed. We elected to then exchanged the 5 French arterial sheath for a standard 10 cm 5 French arterial sheath for arterial monitoring. Sheath was sutured in position. Sterile dressings were placed. Patient remained hemodynamically unchanged throughout the procedure, on pressor support and intubated. 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 Tortuous common hepatic artery. The gastroduodenal artery and gastroepiploic artery originate from downgoing branch of the proximal loop of the common hepatic artery. Angiogram of the SMA demonstrates replaced right hepatic artery.  Attenuated arterial system in the SMA, likely related to vaso constriction/circulatory shock status Coil embolization of the replaced right hepatic artery was performed empirically, given the presence of the surgical clips in relation to the expected bleed and the possibility of pancreaticoduodenal arcade arteries from this system. 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 No flow at the conclusion. IMPRESSION: Status post ultrasound guided access right common femoral artery for mesenteric angiogram and coil embolization of gastroduodenal artery and replaced right hepatic artery, for treatment of acute life-threatening upper GI hemorrhage secondary to a pseudoaneurysm originating from pancreaticoduodenal  arcade. Status post placement of right common femoral vein triple-lumen catheter Signed, Dulcy Fanny. Nadene Rubins, RPVI [facility] Radiology Electronically Signed   By: Corrie Mckusick D.O.   On: 08/20/2021 10:55   IR EMBO ART  VEN HEMORR LYMPH EXTRAV  INC GUIDE ROADMAPPING  Result Date: 08/20/2021 INDICATION: 71 year old male with acute, emergent life threatening upper GI hemorrhage, after ERCP EXAM: ULTRASOUND-GUIDED ACCESS RIGHT COMMON FEMORAL ARTERY IMAGE GUIDED CENTRAL VENOUS CATHETER MESENTERIC ANGIOGRAM EMPIRIC EMBOLIZATION OF GASTRODUODENAL ARTERY EMPIRIC EMBOLIZATION OF THE REPLACED RIGHT HEPATIC ARTERY, GIVEN POSSIBLE SOURCE OF LOWER PANCREATICODUODENAL CONTRIBUTION MEDICATIONS: NONE ANESTHESIA/SEDATION: Moderate (conscious) sedation was NOT employed during this procedure. The patient was intubated under general anesthesia Total intra-service moderate Sedation Time: 0 minutes. CONTRAST:  110 cc FLUOROSCOPY: Radiation  Exposure Index (as provided by the fluoroscopic device): 4098 mGy Kerma COMPLICATIONS: None PROCEDURE: Informed consent was obtained from the patient following explanation of the procedure, risks, benefits and alternatives. The patient understands, agrees and consents for the procedure. All questions were addressed. A time out was performed prior to the initiation of the procedure. Maximal barrier sterile technique utilized including caps, mask, sterile gowns, sterile gloves, large sterile drape, hand hygiene, and Betadine prep. Ultrasound survey of the right inguinal region was performed with images stored and sent to PACs, confirming patency of the vessel. A micropuncture needle was used access the right common femoral artery under ultrasound. With excellent arterial blood flow returned, and an .018 micro wire was passed through the needle, observed enter the abdominal aorta under fluoroscopy. The needle was removed, and a micropuncture sheath was placed over the wire. The inner dilator and wire were removed, and an 035 Bentson wire was advanced under fluoroscopy into the abdominal aorta. The sheath was removed and a standard 5 [place] vascular sheath was placed. The dilator was removed and the sheath was flushed. We then proceeded with placement of right common femoral vein central venous catheter. Ultrasound survey of the right common femoral vein was performed with images stored and sent to PACs. Ultrasound confirmed patency of the vessel. A single wall needle was used access the right common femoral vein under ultrasound. With excellent blood flow returned, a Bentson wire was passed through the needle, observed to enter the IVC under fluoroscopy. The needle was removed, and a triple-lumen central venous catheter was placed over the wire. Wire removed, and the 3 lumen on the central venous catheter were flushed and capped. Catheter was sutured in position. C2 cobra catheter was advanced on the Bentson wire  through the arterial sheath, into the abdominal aorta. Wire was removed and the catheter was used to select the celiac artery. Angiogram was performed. A standard Glidewire was attempted to navigate into the common hepatic artery for further purchase given the patient's abdominal aortic aneurysm and a tentative purchase at the celiac artery origin. This flipped the catheter into the aorta, and the catheter then engage the superior mesenteric artery. Angiogram was performed. We elected to proceed with interrogation of the SMA arterial branches which may perfuse inferior pancreaticoduodenal arteries, given the patient's hemodynamic status and urgent hemorrhage. Microcatheter and microwire, using an STC 105 cm catheter and a 14 fathom wire were navigated into the replaced right hepatic artery. Angiogram was performed. The artery was then empirically embolized, given the possibility that there was arterial contribution into inferior pancreaticoduodenal arteries supplying this region. After embolization to stasis repeat angiogram was performed. The Cobra catheter was then used to engage the  celiac artery. Microwire and microcatheter were advanced through the C2 catheter into the hepatic artery. The tentative purchase at the origin did not allow microcatheter and microwire to tract within the common hepatic artery. The Cobra catheter was then removed on a Bentson wire and the 10 cm 5 French sheath was exchanged for a 35 cm 5 French sheath. C2 Cobra catheter was replaced. Again, there was poor purchase at the celiac artery origin, and the C2 cobra catheter was exchanged for a renal double curved catheter. Once the renal double curve was in position, microcatheter microwire were used in attempt to enter the common hepatic artery. The renal double curve catheter was then exchanged for a Mickelson catheter. With the [facility] catheter formed in the lower thoracic aorta, the tip was used to engage celiac artery. This proved to  have enough purchase at the origin to advance the microcatheter and microwire into the common hepatic artery. Angiogram was performed. Microwire microcatheter were then advanced into the gastroduodenal artery. Angiogram was performed. The pseudoaneurysm flushed identified, and the microcatheter was advanced beyond the origin of the pancreaticoduodenal arcade that was supplying the region. Coil embolization was then performed of the entire length of the GDA. Gel-Foam slurry was administered at the completion with stasis achieved. Microcatheter microwire were removed and a final angiogram was performed from the base catheter. Catheter and wires were removed. We elected to then exchanged the 5 French arterial sheath for a standard 10 cm 5 French arterial sheath for arterial monitoring. Sheath was sutured in position. Sterile dressings were placed. Patient remained hemodynamically unchanged throughout the procedure, on pressor support and intubated. 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 Tortuous common hepatic artery. The gastroduodenal artery and gastroepiploic artery originate from downgoing branch of the proximal loop of the common hepatic artery. Angiogram of the SMA demonstrates replaced right hepatic artery. Attenuated arterial system in the SMA, likely related to vaso constriction/circulatory shock status Coil embolization of the replaced right hepatic artery was performed empirically, given the presence of the surgical clips in relation to the expected bleed and the possibility of  pancreaticoduodenal arcade arteries from this system. 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 No flow at the conclusion. 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 Status post placement of right common femoral vein triple-lumen catheter Signed, Dulcy Fanny. Nadene Rubins, RPVI [facility] Inter[facility] Radiology Specialists [facility] Radiology Electronically Signed   By: Corrie Mckusick D.O.   On: 08/20/2021 10:55   IR Angiogram Selective Each Additional Vessel  Result Date: 08/20/2021 INDICATION: 71 year old male with acute, emergent life threatening upper GI hemorrhage, after ERCP EXAM: ULTRASOUND-GUIDED ACCESS RIGHT COMMON FEMORAL ARTERY IMAGE GUIDED CENTRAL VENOUS CATHETER MESENTERIC ANGIOGRAM EMPIRIC EMBOLIZATION OF GASTRODUODENAL ARTERY EMPIRIC EMBOLIZATION OF THE REPLACED RIGHT HEPATIC ARTERY, GIVEN POSSIBLE  SOURCE OF LOWER PANCREATICODUODENAL CONTRIBUTION MEDICATIONS: NONE ANESTHESIA/SEDATION: Moderate (conscious) sedation was NOT employed during this procedure. The patient was intubated under general anesthesia Total intra-service moderate Sedation Time: 0 minutes. CONTRAST:  110 cc FLUOROSCOPY: Radiation Exposure Index (as provided by the fluoroscopic device): 6433 mGy Kerma COMPLICATIONS: None PROCEDURE: Informed consent was obtained from the patient following explanation of the procedure, risks, benefits and alternatives. The patient understands, agrees and consents for the procedure. All  questions were addressed. A time out was performed prior to the initiation of the procedure. Maximal barrier sterile technique utilized including caps, mask, sterile gowns, sterile gloves, large sterile drape, hand hygiene, and Betadine prep. Ultrasound survey of the right inguinal region was performed with images stored and sent to PACs, confirming patency of the vessel. A micropuncture needle was used access the right common femoral artery under ultrasound. With excellent arterial blood flow returned, and an .018 micro wire was passed through the needle, observed enter the abdominal aorta under fluoroscopy. The needle was removed, and a micropuncture sheath was placed over the wire. The inner dilator and wire were removed, and an 035 Bentson wire was advanced under fluoroscopy into the abdominal aorta. The sheath was removed and a standard 5 [place] vascular sheath was placed. The dilator was removed and the sheath was flushed. We then proceeded with placement of right common femoral vein central venous catheter. Ultrasound survey of the right common femoral vein was performed with images stored and sent to PACs. Ultrasound confirmed patency of the vessel. A single wall needle was used access the right common femoral vein under ultrasound. With excellent blood flow returned, a Bentson wire was passed through the needle, observed to enter the IVC under fluoroscopy. The needle was removed, and a triple-lumen central venous catheter was placed over the wire. Wire removed, and the 3 lumen on the central venous catheter were flushed and capped. Catheter was sutured in position. C2 cobra catheter was advanced on the Bentson wire through the arterial sheath, into the abdominal aorta. Wire was removed and the catheter was used to select the celiac artery. Angiogram was performed. A standard Glidewire was attempted to navigate into the common hepatic artery for further purchase given the patient's abdominal aortic aneurysm  and a tentative purchase at the celiac artery origin. This flipped the catheter into the aorta, and the catheter then engage the superior mesenteric artery. Angiogram was performed. We elected to proceed with interrogation of the SMA arterial branches which may perfuse inferior pancreaticoduodenal arteries, given the patient's hemodynamic status and urgent hemorrhage. Microcatheter and microwire, using an STC 105 cm catheter and a 14 fathom wire were navigated into the replaced right hepatic artery. Angiogram was performed. The artery was then empirically embolized, given the possibility that there was arterial contribution into inferior pancreaticoduodenal arteries supplying this region. After embolization to stasis repeat angiogram was performed. The Cobra catheter was then used to engage the celiac artery. Microwire and microcatheter were advanced through the C2 catheter into the hepatic artery. The tentative purchase at the origin did not allow microcatheter and microwire to tract within the common hepatic artery. The Cobra catheter was then removed on a Bentson wire and the 10 cm 5 French sheath was exchanged for a 35 cm 5 French sheath. C2 Cobra catheter was replaced. Again, there was poor purchase at the celiac artery origin, and the C2 cobra catheter was exchanged for a renal double curved catheter. Once the renal double curve was in position, microcatheter microwire were  used in attempt to enter the common hepatic artery. The renal double curve catheter was then exchanged for a Mickelson catheter. With the [facility] catheter formed in the lower thoracic aorta, the tip was used to engage celiac artery. This proved to have enough purchase at the origin to advance the microcatheter and microwire into the common hepatic artery. Angiogram was performed. Microwire microcatheter were then advanced into the gastroduodenal artery. Angiogram was performed. The pseudoaneurysm flushed identified, and the microcatheter  was advanced beyond the origin of the pancreaticoduodenal arcade that was supplying the region. Coil embolization was then performed of the entire length of the GDA. Gel-Foam slurry was administered at the completion with stasis achieved. Microcatheter microwire were removed and a final angiogram was performed from the base catheter. Catheter and wires were removed. We elected to then exchanged the 5 French arterial sheath for a standard 10 cm 5 French arterial sheath for arterial monitoring. Sheath was sutured in position. Sterile dressings were placed. Patient remained hemodynamically unchanged throughout the procedure, on pressor support and intubated. 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 Tortuous common hepatic artery. The gastroduodenal artery and gastroepiploic artery originate from downgoing branch of the proximal loop of the common hepatic artery. Angiogram of the SMA demonstrates replaced right hepatic artery. Attenuated arterial system in the SMA, likely related to vaso constriction/circulatory shock status Coil embolization of the replaced right hepatic artery was performed empirically, given the presence of the surgical clips in relation to the expected bleed and the possibility of pancreaticoduodenal arcade arteries from this system. The patient has patent portal vein on prior CT Angiogram of the gastroduodenal artery demonstrates the pseudoaneurysm originating from superior pancreaticoduodenal branches with active extravasation observed (X a series 14) Coil embolization was performed  along the GDA to stasis. No flow at the conclusion. 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 Status post placement of right common femoral vein triple-lumen catheter Signed, Dulcy Fanny. Nadene Rubins, RPVI [facility] Inter[facility] Radiology Specialists [facility] Radiology Electronically Signed   By: Corrie Mckusick D.O.   On: 08/20/2021 10:55   IR US Guide Vasc Access Right  Result Date: 08/20/2021 INDICATION: 71 year old male with acute, emergent life threatening upper GI hemorrhage, after ERCP EXAM: ULTRASOUND-GUIDED ACCESS RIGHT COMMON FEMORAL ARTERY IMAGE GUIDED CENTRAL VENOUS CATHETER MESENTERIC ANGIOGRAM EMPIRIC EMBOLIZATION OF GASTRODUODENAL ARTERY EMPIRIC EMBOLIZATION OF THE REPLACED RIGHT HEPATIC ARTERY, GIVEN POSSIBLE SOURCE OF LOWER PANCREATICODUODENAL CONTRIBUTION MEDICATIONS: NONE ANESTHESIA/SEDATION: Moderate (conscious) sedation was NOT employed during this procedure. The patient was intubated under general anesthesia Total intra-service moderate Sedation Time: 0 minutes. CONTRAST:  110 cc FLUOROSCOPY: Radiation Exposure Index (as provided by the fluoroscopic device): 0093 mGy Kerma COMPLICATIONS: None PROCEDURE: Informed consent was obtained from the patient following explanation of the procedure, risks, benefits and alternatives. The patient understands, agrees and consents for the procedure. All questions were addressed. A time out was performed prior to the initiation of the procedure. Maximal barrier sterile technique utilized including caps,  mask, sterile gowns, sterile gloves, large sterile drape, hand hygiene, and Betadine prep. Ultrasound survey of the right inguinal region was performed with images stored and  sent to PACs, confirming patency of the vessel. A micropuncture needle was used access the right common femoral artery under ultrasound. With excellent arterial blood flow returned, and an .018 micro wire was passed through the needle, observed enter the abdominal aorta under fluoroscopy. The needle was removed, and a micropuncture sheath was placed over the wire. The inner dilator and wire were removed, and an 035 Bentson wire was advanced under fluoroscopy into the abdominal aorta. The sheath was removed and a standard 5 [place] vascular sheath was placed. The dilator was removed and the sheath was flushed. We then proceeded with placement of right common femoral vein central venous catheter. Ultrasound survey of the right common femoral vein was performed with images stored and sent to PACs. Ultrasound confirmed patency of the vessel. A single wall needle was used access the right common femoral vein under ultrasound. With excellent blood flow returned, a Bentson wire was passed through the needle, observed to enter the IVC under fluoroscopy. The needle was removed, and a triple-lumen central venous catheter was placed over the wire. Wire removed, and the 3 lumen on the central venous catheter were flushed and capped. Catheter was sutured in position. C2 cobra catheter was advanced on the Bentson wire through the arterial sheath, into the abdominal aorta. Wire was removed and the catheter was used to select the celiac artery. Angiogram was performed. A standard Glidewire was attempted to navigate into the common hepatic artery for further purchase given the patient's abdominal aortic aneurysm and a tentative purchase at the celiac artery origin. This flipped the catheter into the aorta, and the catheter then engage the superior mesenteric artery. Angiogram was performed. We elected to proceed with interrogation of the SMA arterial branches which may perfuse inferior pancreaticoduodenal arteries, given the  patient's hemodynamic status and urgent hemorrhage. Microcatheter and microwire, using an STC 105 cm catheter and a 14 fathom wire were navigated into the replaced right hepatic artery. Angiogram was performed. The artery was then empirically embolized, given the possibility that there was arterial contribution into inferior pancreaticoduodenal arteries supplying this region. After embolization to stasis repeat angiogram was performed. The Cobra catheter was then used to engage the celiac artery. Microwire and microcatheter were advanced through the C2 catheter into the hepatic artery. The tentative purchase at the origin did not allow microcatheter and microwire to tract within the common hepatic artery. The Cobra catheter was then removed on a Bentson wire and the 10 cm 5 French sheath was exchanged for a 35 cm 5 French sheath. C2 Cobra catheter was replaced. Again, there was poor purchase at the celiac artery origin, and the C2 cobra catheter was exchanged for a renal double curved catheter. Once the renal double curve was in position, microcatheter microwire were used in attempt to enter the common hepatic artery. The renal double curve catheter was then exchanged for a Mickelson catheter. With the [facility] catheter formed in the lower thoracic aorta, the tip was used to engage celiac artery. This proved to have enough purchase at the origin to advance the microcatheter and microwire into the common hepatic artery. Angiogram was performed. Microwire microcatheter were then advanced into the gastroduodenal artery. Angiogram was performed. The pseudoaneurysm flushed identified, and the microcatheter was advanced beyond the origin of the pancreaticoduodenal arcade that was supplying the region. Coil embolization was then  performed of the entire length of the GDA. Gel-Foam slurry was administered at the completion with stasis achieved. Microcatheter microwire were removed and a final angiogram was performed from  the base catheter. Catheter and wires were removed. We elected to then exchanged the 5 French arterial sheath for a standard 10 cm 5 French arterial sheath for arterial monitoring. Sheath was sutured in position. Sterile dressings were placed. Patient remained hemodynamically unchanged throughout the procedure, on pressor support and intubated. 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 Tortuous common hepatic artery. The gastroduodenal artery and gastroepiploic artery originate from downgoing branch of the proximal loop of the common hepatic artery. Angiogram of the SMA demonstrates replaced right hepatic artery. Attenuated arterial system in the SMA, likely related to vaso constriction/circulatory shock status Coil embolization of the replaced right hepatic artery was performed empirically, given the presence of the surgical clips in relation to the expected bleed and the possibility of pancreaticoduodenal arcade arteries from this system. 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 No flow at the conclusion. 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 Status post placement of right common femoral vein triple-lumen catheter Signed, Dulcy Fanny. Nadene Rubins, RPVI [facility] Radiology Electronically Signed   By: Corrie Mckusick D.O.   On: 08/20/2021 10:55   DG CHEST PORT 1 VIEW  Result Date: 08/20/2021 CLINICAL DATA:  Intubated EXAM: PORTABLE CHEST 1 VIEW COMPARISON:  CTA chest dated 06/18/2021 FINDINGS: Right basilar atelectasis. Left lung is clear. No pleural effusion or pneumothorax. Endotracheal tube terminates 3 cm above the carina. Heart is normal in size.  Prosthetic aortic valve. Median sternotomy. IMPRESSION: Endotracheal tube terminates 3 cm above the carina. Electronically Signed   By: Julian Hy M.D.   On: 08/20/2021 02:19   CT ANGIO GI BLEED  Addendum Date: 08/19/2021   ADDENDUM REPORT: 08/19/2021 20:39 ADDENDUM: These results were called by telephone at the time of interpretation on 08/19/2021 at 8:35 pm to provider Dr Oletta Darter, who verbally acknowledged these results. Electronically Signed   By: Julian Hy M.D.   On: 08/19/2021 20:39   Result Date: 08/19/2021 CLINICAL DATA:  Lower GI bleed EXAM: CTA ABDOMEN AND PELVIS WITHOUT AND WITH CONTRAST TECHNIQUE: Multidetector CT imaging of the abdomen and pelvis was performed using the standard protocol during bolus administration of intravenous contrast. Multiplanar reconstructed images and MIPs were obtained and reviewed to evaluate the vascular anatomy. RADIATION DOSE REDUCTION: This exam was performed according to the departmental dose-optimization program which includes automated exposure control, adjustment of the mA and/or kV according to patient size and/or use of iterative reconstruction technique. CONTRAST:  14m OMNIPAQUE IOHEXOL 350 MG/ML SOLN COMPARISON:  MRI abdomen dated 08/17/2021. CT abdomen/pelvis dated 08/16/2021. FINDINGS: VASCULAR Aorta: 4.9 x 5.1 cm  infrarenal abdominal aortic aneurysm. Atherosclerotic calcifications of the aortic arch. Patent. Celiac: Patent. SMA: Patent. Renals: Patent bilaterally. IMA: Patent. Inflow: Patent bilaterally. Atherosclerotic calcifications. 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 3.7 cm short axis left common iliac artery aneurysm. Proximal Outflow: Patent. Veins: Unremarkable. Active intravasation of contrast within the lumen of the 2nd portion of the duodenum (series 5/images 91 and 94). This reflects the site of the patient's active GI bleeding. Associated findings described below. Review of the MIP images confirms the above findings. NON-VASCULAR Lower chest: Lung bases are clear.  Prosthetic aortic valve. Hepatobiliary: Subcentimeter hepatic cysts. Mild intrahepatic and extrahepatic ductal dilatation. Hyperdense material within the common duct (series 7/image 31), possibly postprocedural versus hemorrhage. Pancreas: Within normal limits. Spleen: Within normal limits. Adrenals/Urinary Tract: Adrenal glands are within normal limits. Bilateral renal cysts, most of which are simple, including a 5.0 cm right lower pole renal sinus cyst (series 7/image 34). Additional 2.1 cm lateral left upper pole cyst with calcified septations (series 2/image 30), but no enhancement following contrast administration, benign (Bosniak II). No renal calculi or hydronephrosis. Bladder is within normal limits. Stomach/Bowel: Enteric tube terminates in the distal gastric body. 4.3 x 6.3 cm hematoma expanding the 1st and 2nd portion of the duodenum (series 2/image 38) and an adjacent duodenal diverticulum (coronal image 68). This corresponds to the site of active GI bleeding identified above. Hyperdense material/hemorrhage extends into the 3rd portion of the duodenum (series 2/image 42). No evidence of bowel obstruction. Normal appendix (series 5/image 120). Extensive sigmoid diverticulosis, without evidence of diverticulitis. Lymphatic:  Small upper abdominal lymph nodes. No suspicious abdominopelvic lymphadenopathy. Reproductive: Prostate is unremarkable. Other: No abdominopelvic ascites. Musculoskeletal: Mild degenerative changes the lumbar spine. Median sternotomy. IMPRESSION: Active GI bleeding within the 2nd portion the duodenum. Associated large hematoma. Hyperdense material in the common duct, possibly postprocedural versus hemorrhage given additional findings. Associated mild intrahepatic and extrahepatic ductal dilatation. 5.1 cm infrarenal abdominal aortic aneurysm. 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 Consider vascular surgery consultation. Aortic Atherosclerosis (ICD10-I70.0). Electronically Signed: By: Julian Hy M.D. On: 08/19/2021 20:25   DG Abd 1 View  Result Date: 08/19/2021 CLINICAL DATA:  Encounter for nasogastric tube placement. EXAM: ABDOMEN - 1 VIEW COMPARISON:  CT abdomen and pelvis 08/16/2021 FINDINGS: AP portable view of the abdomen was obtained. Nasogastric tube extends into the abdomen and the tip is in the gastric body region. Evidence for cholecystectomy clips. Evidence for embolization coils in the upper pelvis. Nonobstructive bowel gas pattern. Severe joint space loss along the superior right hip joint with remodeling in the right femoral head. IMPRESSION: 1. Nasogastric tube tip in the stomach body region. 2. Severe joint space loss and degenerative changes in the right hip. Electronically Signed   By: Markus Daft M.D.   On: 08/19/2021 16:20   DG ERCP  Result Date: 08/19/2021 CLINICAL DATA:  Suspected choledocholithiasis EXAM: ERCP TECHNIQUE: Multiple spot images obtained with the fluoroscopic device and submitted for interpretation post-procedure. COMPARISON:  MRCP 08/17/2021 FINDINGS: Series of fluoroscopic spot images document endoscopic cannulation and opacification of the CBD. Filling defects are noted in the distal CBD consistent with choledocholithiasis. Subsequent  image demonstrates balloon catheter placement into the common bile duct. The intrahepatic ducts are incompletely visualized, mildly dilated centrally. Cholecystectomy clips. No extravasation evident. IMPRESSION: Endoscopic CBD cannulation and intervention as above. These images were submitted for radiologic interpretation only. Please see the procedural report for the amount of contrast and the fluoroscopy time  utilized. Electronically Signed   By: Lucrezia Europe M.D.   On: 08/19/2021 14:16   MR ABDOMEN MRCP W WO CONTAST  Result Date: 08/17/2021 CLINICAL DATA:  Biliary obstruction suspected EXAM: MRI ABDOMEN WITHOUT AND WITH CONTRAST (INCLUDING MRCP) TECHNIQUE: Multiplanar multisequence MR imaging of the abdomen was performed both before and after the administration of intravenous contrast. Heavily T2-weighted images of the biliary and pancreatic ducts were obtained, and three-dimensional MRCP images were rendered by post processing. CONTRAST:  7m GADAVIST GADOBUTROL 1 MMOL/ML IV SOLN COMPARISON:  CT abdomen pelvis, 08/16/2021 FINDINGS: Lower chest: No acute findings. Hepatobiliary: No mass or other parenchymal abnormality identified. Status post cholecystectomy. Suspect at least one gallstone in the central common bile duct near the ampulla measuring 0.6 cm (series 12, image 19, series 11, image 21). No significant biliary ductal dilatation, however there is periportal edema (series 3, image 19) Pancreas: No mass, inflammatory changes, or other parenchymal abnormality identified.No pancreatic ductal dilatation. Spleen:  Within normal limits in size and appearance. Adrenals/Urinary Tract: Normal adrenal glands. Multiple bilateral renal cortical and parapelvic cysts. No solid mass or suspicious contrast enhancement. Heterogeneous, septated cyst of the superior pole of the left kidney, which is calcified on comparison CT (series 17, image 21). No renal masses or suspicious contrast enhancement identified. No evidence  of hydronephrosis. Stomach/Bowel: Descending colonic diverticulosis. Visualized portions within the abdomen are otherwise unremarkable. Vascular/Lymphatic: No pathologically enlarged lymph nodes identified. Partially imaged abdominal aortic aneurysm measuring 4.9 x 4.7 cm (series 12, image 20). Other:  None. Musculoskeletal: No suspicious osseous lesions identified. IMPRESSION: 1. Suspect at least one gallstone in the central common bile duct near the ampulla measuring 0.6 cm. No significant biliary ductal dilatation, however there is periportal edema. Findings are concerning for choledocholithiasis status post cholecystectomy. 2. Partially imaged abdominal aortic aneurysm measuring 4.9 x 4.7 cm. 3. Descending colonic diverticulosis. These results will be called to the ordering clinician or representative by the Radiologist Assistant, and communication documented in the PACS or [employer] Electronically Signed   By: ADelanna AhmadiM.D.   On: 08/17/2021 12:10   MR 3D Recon At Scanner  Result Date: 08/17/2021 CLINICAL DATA:  Biliary obstruction suspected EXAM: MRI ABDOMEN WITHOUT AND WITH CONTRAST (INCLUDING MRCP) TECHNIQUE: Multiplanar multisequence MR imaging of the abdomen was performed both before and after the administration of intravenous contrast. Heavily T2-weighted images of the biliary and pancreatic ducts were obtained, and three-dimensional MRCP images were rendered by post processing. CONTRAST:  962mGADAVIST GADOBUTROL 1 MMOL/ML IV SOLN COMPARISON:  CT abdomen pelvis, 08/16/2021 FINDINGS: Lower chest: No acute findings. Hepatobiliary: No mass or other parenchymal abnormality identified. Status post cholecystectomy. Suspect at least one gallstone in the central common bile duct near the ampulla measuring 0.6 cm (series 12, image 19, series 11, image 21). No significant biliary ductal dilatation, however there is periportal edema (series 3, image 19) Pancreas: No mass, inflammatory changes, or  other parenchymal abnormality identified.No pancreatic ductal dilatation. Spleen:  Within normal limits in size and appearance. Adrenals/Urinary Tract: Normal adrenal glands. Multiple bilateral renal cortical and parapelvic cysts. No solid mass or suspicious contrast enhancement. Heterogeneous, septated cyst of the superior pole of the left kidney, which is calcified on comparison CT (series 17, image 21). No renal masses or suspicious contrast enhancement identified. No evidence of hydronephrosis. Stomach/Bowel: Descending colonic diverticulosis. Visualized portions within the abdomen are otherwise unremarkable. Vascular/Lymphatic: No pathologically enlarged lymph nodes identified. Partially imaged abdominal aortic aneurysm measuring 4.9 x 4.7 cm (series  12, image 20). Other:  None. Musculoskeletal: No suspicious osseous lesions identified. IMPRESSION: 1. Suspect at least one gallstone in the central common bile duct near the ampulla measuring 0.6 cm. No significant biliary ductal dilatation, however there is periportal edema. Findings are concerning for choledocholithiasis status post cholecystectomy. 2. Partially imaged abdominal aortic aneurysm measuring 4.9 x 4.7 cm. 3. Descending colonic diverticulosis. These results will be called to the ordering clinician or representative by the Radiologist Assistant, and communication documented in the PACS or [employer]. Electronically Signed   By: Delanna Ahmadi M.D.   On: 08/17/2021 12:10    Labs:  CBC: Recent Labs    08/19/21 2300 08/20/21 0308 08/20/21 0612 08/20/21 1258 08/20/21 1836 08/21/21 0330  WBC 13.7*  --  18.2*  --  19.2* 17.1*  HGB 10.8*   < > 11.8* 10.7* 9.9* 9.2*  HCT 31.9*   < > 35.1* 31.7* 29.0* 27.9*  PLT 84*  --  112*  --  102* 101*   < > = values in this interval not displayed.    COAGS: Recent Labs    05/26/21 0650 08/19/21 1707 08/20/21 1836 08/21/21 0330  INR 1.2 1.1 1.2 1.1  APTT 23*  --   --   --      BMP: Recent Labs    08/19/21 1551 08/20/21 0612 08/20/21 1836 08/21/21 0330  NA 137 136 137 140  K 2.8* 5.2* 4.3 4.1  CL 102 105 107 109  CO2 21* 20* 21* 23  GLUCOSE 165* 170* 161* 139*  BUN 17 27* 41* 39*  CALCIUM 8.4* 7.7* 7.8* 7.8*  CREATININE 1.01 1.82* 1.79* 1.46*  GFRNONAA >60 39* 40* 51*    LIVER FUNCTION TESTS: Recent Labs    08/19/21 1551 08/20/21 0612 08/20/21 1836 08/21/21 0330  BILITOT 3.2* 2.9* 1.3* 1.4*  AST 223* 140* 116* 82*  ALT 232* 196* 183* 153*  ALKPHOS 270* 223* 165* 146*  PROT 6.5 5.5* 5.6* 5.5*  ALBUMIN 3.2* 2.7* 2.9* 2.8*    Assessment and Plan: Pt with hx choledocholithiasis, acute GI bleed post ERCP with BRB in esophagus on EGD from ? 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 Rowe Robert, PA-C 08/21/2021, 9:47 AM   I spent a total of 15 Minutes at the the patient's bedside AND on the patient's hospital floor or unit, greater than 50% of which was counseling/coordinating care for visceral arteriogram with embolization    Patient ID: Lonnie Hansen, male   DOB: 12-Apr-1950, 71 y.o.   MRN: [id number]

## 2021-08-21 NOTE — Progress Notes (Signed)
Called report to Alexia RN on 4E who will be taking over care for this patient. PT VSS, 2200 medications given before transfer.  PICC line unsuccessful, on call [facility] MD Jani Gravel notified and per PICC RN it will need to be placed by IR tomorrow. Will make sure Alexia is aware of this as well so that it can be addressed in the morning.

## 2021-08-21 NOTE — Progress Notes (Signed)
Pt refusing CPAP QHS at this time.  

## 2021-08-21 NOTE — Evaluation (Signed)
Physical Therapy Evaluation Patient Details Name: Lonnie Hansen MRN: [id number] DOB: 06/08/1950 Today's Date: 08/21/2021  History of Present Illness  Pt is a 71 y.o. male admitted 08/16/21 with RUQ pain and dx with transaminitis and hemorrhage of GI tract.  Pt s/p ERCP and sphincterotomy 08/19/21  requiring intubation following.  Pt extubated 08/20/21.  PMH significant for diverticulitis, AAA, AVR, chronic ITP, GERD, hyperlipidemia, OSA on CPAP, hypertension, GI bleeding, and CAD.  Clinical Impression  Pt admitted as above and presenting with functional mobility limitations 2* generalized weakness, balance deficits, and limited endurance.  Pt should progress to dc home with family assist.     Recommendations for follow up therapy are one component of a multi-disciplinary discharge planning process, led by the attending physician.  Recommendations may be updated based on patient status, additional functional criteria and insurance authorization.  Follow Up Recommendations No PT follow up      Assistance Recommended at Discharge Frequent or constant Supervision/Assistance  Patient can return home with the following  A little help with walking and/or transfers;A little help with bathing/dressing/bathroom;Assistance with cooking/housework;Assist for transportation;Help with stairs or ramp for entrance    Equipment Recommendations None recommended by PT  Recommendations for Other Services       Functional Status Assessment Patient has had a recent decline in their functional status and demonstrates the ability to make significant improvements in function in a reasonable and predictable amount of time.     Precautions / Restrictions Precautions Precautions: Fall;Other (comment) Precaution Comments: rectal tube in place Restrictions Weight Bearing Restrictions: No      Mobility  Bed Mobility Overal bed mobility: Needs Assistance Bed Mobility: Supine to Sit     Supine to sit: Min  assist, Mod assist     General bed mobility comments: modified roll to exit bed with assist to bring trunk to upright    Transfers Overall transfer level: Needs assistance Equipment used: Rolling walker (2 wheels) Transfers: Sit to/from Stand Sit to Stand: Min assist, +2 physical assistance           General transfer comment: cues for use of UEs to self assist.  Physical assist to bring wt up and fwd and to balance in initial standing    Ambulation/Gait Ambulation/Gait assistance: Min assist, +2 safety/equipment Gait Distance (Feet): 34 Feet Assistive device: Rolling walker (2 wheels) Gait Pattern/deviations: Step-to pattern, Decreased step length - right, Decreased step length - left, Shuffle, Trunk flexed Gait velocity: decr     General Gait Details: cues for posture and position from RW; distance ltd by fatigue  Stairs            Wheelchair Mobility    Modified Rankin (Stroke Patients Only)       Balance Overall balance assessment: Needs assistance Sitting-balance support: No upper extremity supported, Feet supported Sitting balance-Leahy Scale: Fair     Standing balance support: Bilateral upper extremity supported Standing balance-Leahy Scale: Poor                               Pertinent Vitals/Pain Pain Assessment Pain Assessment: No/denies pain    Home Living Family/patient expects to be discharged to:: Private residence Living Arrangements: Spouse/significant other Available Help at Discharge: Family Type of Home: House Home Access: Stairs to enter Entrance Stairs-Rails: Right Entrance Stairs-Number of Steps: 4 Alternate Level Stairs-Number of Steps: 16 Home Layout: Two level;Able to live on main level with bedroom/bathroom Home  Equipment: Cane - single point;Air cabin crew (4 wheels) Additional Comments: goes to [facility] four days/week    Prior Function Prior Level of Function : Independent/Modified Independent              Mobility Comments: SPC baseline. Reports supposed to be having Rt hip surgery at some point.       Hand Dominance   Dominant Hand: Right    Extremity/Trunk Assessment   Upper Extremity Assessment Upper Extremity Assessment: Generalized weakness    Lower Extremity Assessment Lower Extremity Assessment: Generalized weakness;RLE deficits/detail RLE Deficits / Details: Pt has been planning R THR with Dr Ninfa Linden but has been deferred by recent health issues    Cervical / Trunk Assessment Cervical / Trunk Assessment: Normal  Communication   Communication: No difficulties  Cognition Arousal/Alertness: Awake/alert Behavior During Therapy: WFL for tasks assessed/performed Overall Cognitive Status: Within Functional Limits for tasks assessed                                          General Comments      Exercises     Assessment/Plan    PT Assessment Patient needs continued PT services  PT Problem List Decreased strength;Decreased activity tolerance;Decreased balance;Decreased mobility;Decreased knowledge of use of DME;Pain       PT Treatment Interventions DME instruction;Gait training;Stair training;Functional mobility training;Therapeutic activities;Therapeutic exercise;Patient/family education    PT Goals (Current goals can be found in the Care Plan section)  Acute Rehab PT Goals Patient Stated Goal: regain IND PT Goal Formulation: With patient Time For Goal Achievement: 09/04/21 Potential to Achieve Goals: Good    Frequency Min 3X/week     Co-evaluation               AM-PAC PT "6 Clicks" Mobility  Outcome Measure Help needed turning from your back to your side while in a flat bed without using bedrails?: A Lot Help needed moving from lying on your back to sitting on the side of a flat bed without using bedrails?: A Lot Help needed moving to and from a bed to a chair (including a wheelchair)?: A Lot Help needed standing up from a  chair using your arms (e.g., wheelchair or bedside chair)?: A Lot Help needed to walk in hospital room?: A Little Help needed climbing 3-5 steps with a railing? : A Lot 6 Click Score: 13    End of Session Equipment Utilized During Treatment: Gait belt Activity Tolerance: Patient limited by fatigue Patient left: in chair;with call bell/phone within reach;with chair alarm set Nurse Communication: Mobility status PT Visit Diagnosis: Unsteadiness on feet (R26.81);Muscle weakness (generalized) (M62.81);Difficulty in walking, not elsewhere classified (R26.2)    Time: 1610-9604 PT Time Calculation (min) (ACUTE ONLY): 32 min   Charges:   PT Evaluation $PT Eval Low Complexity: 1 Low PT Treatments $Gait Training: 8-22 mins        Debe Coder PT Acute Rehabilitation Services Pager [phone] Office [phone]   Nabilah Davoli 08/21/2021, 4:02 PM

## 2021-08-21 NOTE — Progress Notes (Addendum)
PROGRESS NOTE   Lonnie Hansen  GDJ:[id number]    DOB: [birth year]    DOA: 08/16/2021  PCP: [name], MD   I have briefly reviewed patients previous medical records in [facility].  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  CT abdomen in the ED showed thickening of the CBD wall and increased AAA size.  He underwent MRCP which was concerning for choledocholithiasis.  [facility] was consulted and performed ERCP on 7/25 which was initially uncomplicated, however in the postprocedural setting the patient developed hematemesis and hematochezia, subsequently became diaphoretic and less responsive with ectopy on telemetry.  He was transferred to ICU and intubated.  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  Patient extubated and stable.  Transferred from PCCM to [facility] on 7/27.   Assessment & Plan:  Principal Problem:   Elevated LFTs Active Problems:   Chronic ITP (idiopathic thrombocytopenia) (HCC)   Hyperlipidemia   History of CAD   Hypertension   Aneurysm of infrarenal abdominal aorta (HCC)   OSA on CPAP   AKI (acute kidney injury) ([place])   Choledocholithiasis   Acute blood loss anemia (ABLA)   Hyperkalemia   Acute cholangitis   Acute gastrointestinal hemorrhage: occurred in the postprocedural setting following ERCP, sphincterotomy from site of CBD. Now s/p coil embolization of GDA and replaced right hepatic artery on 7/25. Off  octreotide.  Briefly on pressors, now off.  Remains on IV PPI twice daily. GI follow-up appreciated, starting clear liquids. No clinically overt or active bleeding.  Ongoing black tarry stools likely from old blood. Hemoglobin stable over the last 24 hours.  Continue to trend daily CBCs.  Transfuse if hemoglobin 7 g or less.   Choledocholithiasis, cholangitis: by CT and MRCP. S/p ERCP, biliary sphincterotomy, balloon extraction of stones 7/25.  Hx of cholecystectomy. LFTs slowly improving, continue to trend. IV ceftriaxone 7/23 >.  [facility] to recommend duration.   Hypotension: transient. Worse with valsalva when he attempts to have a bowel movement.  Briefly on vasopressors, now off. -resolved 7/26  Acute kidney injury: Had normal creatinine on 7/25.  Creatinine peaked to 1.82 on 7/26 with mild hyperkalemia 5.2.  AKI likely related to hypotension/transient hypovolemic/posthemorrhagic shock.  AKI improving.  Avoid nephrotoxic's.  Continue to trend daily BMP.   ITP Chronic: platelets 91 pre GIB. ABLA  -note patient has multiple antibodies  -follow CBC daily.  Stable. -transfuse for platelets <50k   Essential hypertension: Controlled.  Continue atenolol and amlodipine.   Infrarenal abdominal aortic aneurysm. Larger on CT than previously known now up to 5.3 x 4.8 cm (from 4.8 x 4.4 cm). Left internal iliac aneurysm larger now as well.  -follow up with Dr. Donzetta Matters post acute hospitalization     OSA on CPAP -CPAP QHS   Acute urinary retention: DC Foley catheter 7/27 and monitor for voiding.  Body mass index is 26.98 kg/m.  Nutritional Status Nutrition Problem: Inadequate oral intake Etiology: inability to eat Signs/Symptoms: NPO status  Interventions: Refer to RD note for recommendations    DVT prophylaxis: SCDs Start: 08/17/21 0038     Code Status: Full Code:  Family Communication: None at bedside. Disposition:  Status is: Inpatient Remains inpatient appropriate because:  Still n.p.o. this morning, gradually advancing diet while closely monitoring for recurrent bleeding.  Need to monitor daily labs for hemoglobin stability and improving AKI.     Consultants:   PCCM IR [facility]  Procedures:   Intubation/extubation ERCP 7/25 EGD 7/25 Mesenteric angiogram and coil embolization of GDA and replaced right hepatic artery on 7/25.  Antimicrobials:   As noted above   Subjective:  "I am dehydrated".  Denies complaints.  No pain including chest pain and no dyspnea.  As per RN, ongoing black tarry stools and rectal pouch, likely old blood, had 600 mL output yesterday but only 100 mL overnight.  Objective:   Vitals:   08/21/21 0730 08/21/21 0800 08/21/21 0900 08/21/21 1032  BP:  138/83 116/71 123/83  Pulse: 85 88 90 91  Resp: (!) 0 17 16   Temp: 99 F (37.2 C) 99 F (37.2 C) 99 F (37.2 C)   TempSrc:      SpO2: 100% 100% 100%   Weight:      Height:        General exam: Elderly male, moderately built and nourished lying comfortably supine in bed without distress.  Oral mucosa with borderline hydration. Respiratory system: Clear to auscultation. Respiratory effort normal.  Has [place] O2 at 2 L/min but saturating in the 100s, requested RN to wean off as tolerated. Cardiovascular system: S1 & S2 heard, RRR. No JVD, murmurs, rubs, gallops or clicks. No pedal edema.  Telemetry personally reviewed: SR-occasional mild ST in the 100s. Gastrointestinal system: Abdomen is nondistended, soft and nontender. No organomegaly or masses felt. Normal bowel sounds heard. GU: Foley catheter with straw-colored urine in bag. Central nervous system: Alert and oriented. No focal neurological deficits. Extremities: Symmetric 5 x 5 power. Skin: No rashes, lesions or ulcers Psychiatry: Judgement and insight appear normal. Mood & affect appropriate.     Data Reviewed:   I have personally reviewed following labs and imaging studies   CBC: Recent Labs  Lab 08/14/21 1848  08/16/21 1833 08/17/21 0548 08/20/21 0612 08/20/21 1258 08/20/21 1836 08/21/21 0330  WBC 5.7 7.2   < > 18.2*  --  19.2* 17.1*  NEUTROABS 4.0 5.3  --   --   --   --   --   HGB 12.4* 13.3   < > 11.8* 10.7* 9.9* 9.2*  HCT 38.2* 40.5   < > 35.1* 31.7* 29.0* 27.9*  MCV 89.7 87.3   < > 88.2  --  87.9 89.7  PLT 90* 100*   < > 112*  --  102* 101*   < > = values in this interval not displayed.    Basic Metabolic Panel: Recent Labs  Lab 08/17/21 0548 08/18/21 0534 08/19/21 0521 08/19/21 1551 08/20/21 0612 08/20/21 1836 08/21/21 0330  NA 138   < > 136 137 136 137 140  K 3.9   < > 3.4* 2.8* 5.2* 4.3 4.1  CL 99   < > 101 102 105 107 109  CO2 27   < > 24 21* 20* 21* 23  GLUCOSE 125*   < > 97 165* 170* 161* 139*  BUN 11   < > 15 17 27* 41* 39*  CREATININE 0.84   < > 0.95 1.01 1.82* 1.79* 1.46*  CALCIUM 9.3   < > 9.0 8.4* 7.7* 7.8* 7.8*  MG 1.9  --  2.0  --  1.9  --   --   PHOS  --   --   --   --  5.2*  --   --    < > = values in this interval not displayed.    Liver Function Tests: Recent Labs  Lab 08/19/21 0521 08/19/21 1551 08/20/21 0612 08/20/21 1836 08/21/21 0330  AST 112* 223* 140* 116* 82*  ALT 215* 232* 196* 183* 153*  ALKPHOS 213* 270* 223* 165* 146*  BILITOT 1.6* 3.2* 2.9* 1.3* 1.4*  PROT 7.2 6.5 5.5* 5.6* 5.5*  ALBUMIN 3.5 3.2* 2.7* 2.9* 2.8*    CBG: Recent Labs  Lab 08/20/21 1939 08/21/21 0329 08/21/21 0832  GLUCAP 134* 142* 120*    Microbiology Studies:   Recent Results (from the past 240 hour(s))  MRSA Next Gen by PCR, Nasal     Status: None   Collection Time: 08/19/21  4:41 PM   Specimen: Nasal Mucosa; Nasal Swab  Result Value Ref Range Status   MRSA by PCR Next Gen NOT DETECTED NOT DETECTED Final    Comment: (NOTE) The GeneXpert MRSA Assay (FDA approved for NASAL specimens only), is one component of a comprehensive MRSA colonization surveillance program. It is not intended to diagnose MRSA infection nor to guide or monitor treatment for MRSA  infections. Test performance is not FDA approved in patients less than 72 years old. Performed at [facility], [place] [address], [place], [place] [address]     Radiology Studies:  IR Fluoro Guide CV Line Right  Result Date: 08/20/2021 INDICATION: 71 year old male with acute, emergent life threatening upper GI hemorrhage, after ERCP EXAM: ULTRASOUND-GUIDED ACCESS RIGHT COMMON FEMORAL ARTERY IMAGE GUIDED CENTRAL VENOUS CATHETER MESENTERIC ANGIOGRAM EMPIRIC EMBOLIZATION OF GASTRODUODENAL ARTERY EMPIRIC EMBOLIZATION OF THE REPLACED RIGHT HEPATIC ARTERY, GIVEN POSSIBLE SOURCE OF LOWER PANCREATICODUODENAL CONTRIBUTION MEDICATIONS: NONE ANESTHESIA/SEDATION: Moderate (conscious) sedation was NOT employed during this procedure. The patient was intubated under general anesthesia Total intra-service moderate Sedation Time: 0 minutes. CONTRAST:  110 cc FLUOROSCOPY: Radiation Exposure Index (as provided by the fluoroscopic device): 4854 mGy Kerma COMPLICATIONS: None PROCEDURE: Informed consent was obtained from the patient following explanation of the procedure, risks, benefits and alternatives. The patient understands, agrees and consents for the procedure. All questions were addressed. A time out was performed prior to the initiation of the procedure. Maximal barrier sterile technique utilized including caps, mask, sterile gowns, sterile gloves, large sterile drape, hand hygiene, and Betadine prep. Ultrasound survey of the right inguinal region was performed with images stored and sent to PACs, confirming patency of the vessel. A micropuncture needle was used access the right common femoral artery under ultrasound. With excellent arterial blood flow returned, and an .018 micro wire was passed through the needle, observed enter the abdominal aorta under fluoroscopy. The needle was removed, and a micropuncture sheath was placed over the wire. The inner dilator and wire were removed, and an 035 Bentson  wire was advanced under fluoroscopy into the abdominal aorta. The sheath was removed and a standard 5 [place] vascular sheath was placed. The dilator was removed and the sheath was flushed. We then proceeded with placement of right common femoral vein central venous catheter. Ultrasound survey of the right common femoral vein was performed with images stored and sent to PACs. Ultrasound confirmed patency of the vessel. A single wall needle was used access the right common  femoral vein under ultrasound. With excellent blood flow returned, a Bentson wire was passed through the needle, observed to enter the IVC under fluoroscopy. The needle was removed, and a triple-lumen central venous catheter was placed over the wire. Wire removed, and the 3 lumen on the central venous catheter were flushed and capped. Catheter was sutured in position. C2 cobra catheter was advanced on the Bentson wire through the arterial sheath, into the abdominal aorta. Wire was removed and the catheter was used to select the celiac artery. Angiogram was performed. A standard Glidewire was attempted to navigate into the common hepatic artery for further purchase given the patient's abdominal aortic aneurysm and a tentative purchase at the celiac artery origin. This flipped the catheter into the aorta, and the catheter then engage the superior mesenteric artery. Angiogram was performed. We elected to proceed with interrogation of the SMA arterial branches which may perfuse inferior pancreaticoduodenal arteries, given the patient's hemodynamic status and urgent hemorrhage. Microcatheter and microwire, using an STC 105 cm catheter and a 14 fathom wire were navigated into the replaced right hepatic artery. Angiogram was performed. The artery was then empirically embolized, given the possibility that there was arterial contribution into inferior pancreaticoduodenal arteries supplying this region. After embolization to stasis repeat angiogram was  performed. The Cobra catheter was then used to engage the celiac artery. Microwire and microcatheter were advanced through the C2 catheter into the hepatic artery. The tentative purchase at the origin did not allow microcatheter and microwire to tract within the common hepatic artery. The Cobra catheter was then removed on a Bentson wire and the 10 cm 5 French sheath was exchanged for a 35 cm 5 French sheath. C2 Cobra catheter was replaced. Again, there was poor purchase at the celiac artery origin, and the C2 cobra catheter was exchanged for a renal double curved catheter. Once the renal double curve was in position, microcatheter microwire were used in attempt to enter the common hepatic artery. The renal double curve catheter was then exchanged for a Mickelson catheter. With the [facility] catheter formed in the lower thoracic aorta, the tip was used to engage celiac artery. This proved to have enough purchase at the origin to advance the microcatheter and microwire into the common hepatic artery. Angiogram was performed. Microwire microcatheter were then advanced into the gastroduodenal artery. Angiogram was performed. The pseudoaneurysm flushed identified, and the microcatheter was advanced beyond the origin of the pancreaticoduodenal arcade that was supplying the region. Coil embolization was then performed of the entire length of the GDA. Gel-Foam slurry was administered at the completion with stasis achieved. Microcatheter microwire were removed and a final angiogram was performed from the base catheter. Catheter and wires were removed. We elected to then exchanged the 5 French arterial sheath for a standard 10 cm 5 French arterial sheath for arterial monitoring. Sheath was sutured in position. Sterile dressings were placed. Patient remained hemodynamically unchanged throughout the procedure, on pressor support and intubated. Approximately 25 cc blood loss No complications encountered FINDINGS: Ultrasound of  the right inguinal region demonstrates patent common femoral vein Ultrasound of the right inguinal region demonstrates patent common femoral artery Tip of the triple-lumen catheter terminates in the right iliac vein Celiac artery angiogram demonstrates ectasia of the celiac trunk, with origin identified of the splenic artery, common hepatic artery, and left gastric artery. Tortuous common hepatic artery. The gastroduodenal artery and gastroepiploic artery originate from downgoing branch of the proximal loop of the common hepatic artery. Angiogram of the  SMA demonstrates replaced right hepatic artery. Attenuated arterial system in the SMA, likely related to vaso constriction/circulatory shock status Coil embolization of the replaced right hepatic artery was performed empirically, given the presence of the surgical clips in relation to the expected bleed and the possibility of pancreaticoduodenal arcade arteries from this system. 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 No flow at the conclusion. 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 Status post placement of right common femoral vein triple-lumen catheter Signed, Dulcy Fanny. Nadene Rubins, RPVI [facility] Inter[facility] Radiology Electronically Signed   By: Corrie Mckusick D.O.   On: 08/20/2021 19:07   IR Angiogram Visceral Selective  Result Date: 08/20/2021 INDICATION: 71 year old male with acute, emergent life  threatening upper GI hemorrhage, after ERCP EXAM: ULTRASOUND-GUIDED ACCESS RIGHT COMMON FEMORAL ARTERY IMAGE GUIDED CENTRAL VENOUS CATHETER MESENTERIC ANGIOGRAM EMPIRIC EMBOLIZATION OF GASTRODUODENAL ARTERY EMPIRIC EMBOLIZATION OF THE REPLACED RIGHT HEPATIC ARTERY, GIVEN POSSIBLE SOURCE OF LOWER PANCREATICODUODENAL CONTRIBUTION MEDICATIONS: NONE ANESTHESIA/SEDATION: Moderate (conscious) sedation was NOT employed during this procedure. The patient was intubated under general anesthesia Total intra-service moderate Sedation Time: 0 minutes. CONTRAST:  110 cc FLUOROSCOPY: Radiation Exposure Index (as provided by the fluoroscopic device): 6283 mGy Kerma COMPLICATIONS: None PROCEDURE: Informed consent was obtained from the patient following explanation of the procedure, risks, benefits and alternatives. The patient understands, agrees and consents for the procedure. All questions were addressed. A time out was performed prior to the initiation of the procedure. Maximal barrier sterile technique utilized including caps, mask, sterile gowns, sterile gloves, large sterile drape, hand hygiene, and Betadine prep. Ultrasound survey of the right inguinal region was performed with images stored and sent to PACs, confirming patency of the vessel. A micropuncture needle was used access the right common femoral artery under ultrasound. With excellent arterial blood flow returned, and an .018 micro wire was passed through the needle, observed enter the abdominal aorta under fluoroscopy. The needle was removed, and a micropuncture sheath was placed over the wire. The inner dilator and wire were removed, and an 035 Bentson wire was advanced under fluoroscopy into the abdominal aorta. The sheath was removed and a standard 5 [place] vascular sheath was placed. The dilator was removed and the sheath was flushed. We then proceeded with placement of right common femoral vein central venous catheter. Ultrasound survey of the right common  femoral vein was performed with images stored and sent to PACs. Ultrasound confirmed patency of the vessel. A single wall needle was used access the right common femoral vein under ultrasound. With excellent blood flow returned, a Bentson wire was passed through the needle, observed to enter the IVC under fluoroscopy. The needle was removed, and a triple-lumen central venous catheter was placed over the wire. Wire removed, and the 3 lumen on the central venous catheter were flushed and capped. Catheter was sutured in position. C2 cobra catheter was advanced on the Bentson wire through the arterial sheath, into the abdominal aorta. Wire was removed and the catheter was used to select the celiac artery. Angiogram was performed. A standard Glidewire was attempted to navigate into the  common hepatic artery for further purchase given the patient's abdominal aortic aneurysm and a tentative purchase at the celiac artery origin. This flipped the catheter into the aorta, and the catheter then engage the superior mesenteric artery. Angiogram was performed. We elected to proceed with interrogation of the SMA arterial branches which may perfuse inferior pancreaticoduodenal arteries, given the patient's hemodynamic status and urgent hemorrhage. Microcatheter and microwire, using an STC 105 cm catheter and a 14 fathom wire were navigated into the replaced right hepatic artery. Angiogram was performed. The artery was then empirically embolized, given the possibility that there was arterial contribution into inferior pancreaticoduodenal arteries supplying this region. After embolization to stasis repeat angiogram was performed. The Cobra catheter was then used to engage the celiac artery. Microwire and microcatheter were advanced through the C2 catheter into the hepatic artery. The tentative purchase at the origin did not allow microcatheter and microwire to tract within the common hepatic artery. The Cobra catheter was then  removed on a Bentson wire and the 10 cm 5 French sheath was exchanged for a 35 cm 5 French sheath. C2 Cobra catheter was replaced. Again, there was poor purchase at the celiac artery origin, and the C2 cobra catheter was exchanged for a renal double curved catheter. Once the renal double curve was in position, microcatheter microwire were used in attempt to enter the common hepatic artery. The renal double curve catheter was then exchanged for a Mickelson catheter. With the [facility] catheter formed in the lower thoracic aorta, the tip was used to engage celiac artery. This proved to have enough purchase at the origin to advance the microcatheter and microwire into the common hepatic artery. Angiogram was performed. Microwire microcatheter were then advanced into the gastroduodenal artery. Angiogram was performed. The pseudoaneurysm flushed identified, and the microcatheter was advanced beyond the origin of the pancreaticoduodenal arcade that was supplying the region. Coil embolization was then performed of the entire length of the GDA. Gel-Foam slurry was administered at the completion with stasis achieved. Microcatheter microwire were removed and a final angiogram was performed from the base catheter. Catheter and wires were removed. We elected to then exchanged the 5 French arterial sheath for a standard 10 cm 5 French arterial sheath for arterial monitoring. Sheath was sutured in position. Sterile dressings were placed. Patient remained hemodynamically unchanged throughout the procedure, on pressor support and intubated. 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 Tortuous common hepatic artery. The gastroduodenal artery and gastroepiploic artery originate from downgoing branch of the proximal loop of the common hepatic artery. Angiogram of the SMA demonstrates replaced right hepatic artery. Attenuated arterial system in the SMA, likely related to vaso constriction/circulatory shock status Coil embolization of the replaced right hepatic artery was performed empirically, given the presence of the surgical clips in relation to the expected bleed and the possibility of pancreaticoduodenal arcade arteries from this system. 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 No flow at the conclusion. 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 Status post placement of right common femoral vein triple-lumen catheter Signed, Dulcy Fanny. Nadene Rubins, RPVI [facility] Inter[facility] Radiology Specialists [facility] Radiology Electronically Signed   By: Corrie Mckusick D.O.   On: 08/20/2021 10:55   IR US Guide Vasc Access Right  Result Date: 08/20/2021 INDICATION: 71 year old male with acute, emergent life threatening upper GI hemorrhage, after ERCP EXAM: ULTRASOUND-GUIDED ACCESS RIGHT COMMON FEMORAL ARTERY IMAGE GUIDED CENTRAL VENOUS CATHETER MESENTERIC ANGIOGRAM EMPIRIC EMBOLIZATION OF GASTRODUODENAL ARTERY EMPIRIC EMBOLIZATION OF THE REPLACED RIGHT HEPATIC ARTERY, GIVEN POSSIBLE SOURCE OF LOWER PANCREATICODUODENAL  CONTRIBUTION MEDICATIONS: NONE ANESTHESIA/SEDATION: Moderate (conscious) sedation was NOT employed during this procedure. The patient was intubated under general anesthesia Total intra-service moderate Sedation Time: 0 minutes. CONTRAST:  110 cc FLUOROSCOPY: Radiation Exposure Index (as provided by the fluoroscopic device): 9485 mGy Kerma COMPLICATIONS: None PROCEDURE: Informed consent was obtained from the patient following explanation of the procedure, risks, benefits and alternatives. The patient understands, agrees and consents for the procedure. All questions were addressed. A time out was performed prior to the initiation of the procedure. Maximal barrier sterile technique utilized including caps, mask, sterile gowns, sterile gloves, large sterile drape, hand hygiene, and Betadine prep. Ultrasound survey of the right inguinal region was performed with images stored and sent to PACs, confirming patency of the vessel. A micropuncture needle was used access the right common femoral artery under ultrasound. With excellent arterial blood flow returned, and an .018 micro wire was passed through the needle, observed enter the abdominal aorta under fluoroscopy. The needle was removed, and a micropuncture sheath was placed over the wire. The inner dilator and wire were removed, and an 035 Bentson wire was advanced under fluoroscopy into the abdominal aorta. The sheath was removed and a standard 5 [place] vascular sheath was placed. The dilator was removed and the sheath was flushed. We then proceeded with placement of right common femoral vein central venous catheter. Ultrasound survey of the right common femoral vein was performed with images stored and sent to PACs. Ultrasound confirmed patency of the vessel. A single wall needle was used access the right common femoral vein under ultrasound. With excellent blood flow returned, a Bentson wire was passed through the needle, observed to enter the IVC under fluoroscopy.  The needle was removed, and a triple-lumen central venous catheter was placed over the wire. Wire removed, and the 3 lumen on the central venous catheter were flushed and capped. Catheter was sutured in position. C2 cobra catheter was advanced on the Bentson wire through the arterial sheath, into the abdominal aorta. Wire was removed and the catheter was used to select the celiac artery. Angiogram was performed. A standard Glidewire was attempted to navigate into the common hepatic artery for further purchase given the patient's abdominal aortic aneurysm and a tentative purchase at the celiac artery origin. This flipped the catheter into the aorta, and the catheter then engage the superior mesenteric artery. Angiogram was performed. We elected to proceed with interrogation of the SMA arterial branches which may perfuse inferior pancreaticoduodenal arteries, given the patient's hemodynamic status and urgent hemorrhage. Microcatheter and microwire, using an STC 105 cm catheter and a 14 fathom wire were navigated into the replaced right hepatic artery. Angiogram was performed. The artery was then empirically embolized, given the possibility  that there was arterial contribution into inferior pancreaticoduodenal arteries supplying this region. After embolization to stasis repeat angiogram was performed. The Cobra catheter was then used to engage the celiac artery. Microwire and microcatheter were advanced through the C2 catheter into the hepatic artery. The tentative purchase at the origin did not allow microcatheter and microwire to tract within the common hepatic artery. The Cobra catheter was then removed on a Bentson wire and the 10 cm 5 French sheath was exchanged for a 35 cm 5 French sheath. C2 Cobra catheter was replaced. Again, there was poor purchase at the celiac artery origin, and the C2 cobra catheter was exchanged for a renal double curved catheter. Once the renal double curve was in position, microcatheter  microwire were used in attempt to enter the common hepatic artery. The renal double curve catheter was then exchanged for a Mickelson catheter. With the [facility] catheter formed in the lower thoracic aorta, the tip was used to engage celiac artery. This proved to have enough purchase at the origin to advance the microcatheter and microwire into the common hepatic artery. Angiogram was performed. Microwire microcatheter were then advanced into the gastroduodenal artery. Angiogram was performed. The pseudoaneurysm flushed identified, and the microcatheter was advanced beyond the origin of the pancreaticoduodenal arcade that was supplying the region. Coil embolization was then performed of the entire length of the GDA. Gel-Foam slurry was administered at the completion with stasis achieved. Microcatheter microwire were removed and a final angiogram was performed from the base catheter. Catheter and wires were removed. We elected to then exchanged the 5 French arterial sheath for a standard 10 cm 5 French arterial sheath for arterial monitoring. Sheath was sutured in position. Sterile dressings were placed. Patient remained hemodynamically unchanged throughout the procedure, on pressor support and intubated. 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 Tortuous common hepatic artery. The gastroduodenal artery and gastroepiploic artery originate from downgoing branch of the proximal loop of the common hepatic artery. Angiogram of the SMA demonstrates replaced right hepatic artery. Attenuated arterial system in  the SMA, likely related to vaso constriction/circulatory shock status Coil embolization of the replaced right hepatic artery was performed empirically, given the presence of the surgical clips in relation to the expected bleed and the possibility of pancreaticoduodenal arcade arteries from this system. 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 No flow at the conclusion. 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 Status post placement of right common femoral vein triple-lumen catheter Signed, Dulcy Fanny. Nadene Rubins, RPVI [facility] Radiology Electronically Signed   By: Corrie Mckusick D.O.   On: 08/20/2021 10:55   IR Angiogram Selective Each Additional Vessel  Result Date: 08/20/2021 INDICATION: 71 year old male with acute, emergent life threatening upper GI hemorrhage, after ERCP  EXAM: ULTRASOUND-GUIDED ACCESS RIGHT COMMON FEMORAL ARTERY IMAGE GUIDED CENTRAL VENOUS CATHETER MESENTERIC ANGIOGRAM EMPIRIC EMBOLIZATION OF GASTRODUODENAL ARTERY EMPIRIC EMBOLIZATION OF THE REPLACED RIGHT HEPATIC ARTERY, GIVEN POSSIBLE SOURCE OF LOWER PANCREATICODUODENAL CONTRIBUTION MEDICATIONS: NONE ANESTHESIA/SEDATION: Moderate (conscious) sedation was NOT employed during this procedure. The patient was intubated under general anesthesia Total intra-service moderate Sedation Time: 0 minutes. CONTRAST:  110 cc FLUOROSCOPY: Radiation Exposure Index (as provided by the  fluoroscopic device): 3557 mGy Kerma COMPLICATIONS: None PROCEDURE: Informed consent was obtained from the patient following explanation of the procedure, risks, benefits and alternatives. The patient understands, agrees and consents for the procedure. All questions were addressed. A time out was performed prior to the initiation of the procedure. Maximal barrier sterile technique utilized including caps, mask, sterile gowns, sterile gloves, large sterile drape, hand hygiene, and Betadine prep. Ultrasound survey of the right inguinal region was performed with images stored and sent to PACs, confirming patency of the vessel. A micropuncture needle was used access the right common femoral artery under ultrasound. With excellent arterial blood flow returned, and an .018 micro wire was passed through the needle, observed enter the abdominal aorta under fluoroscopy. The needle was removed, and a micropuncture sheath was placed over the wire. The inner dilator and wire were removed, and an 035 Bentson wire was advanced under fluoroscopy into the abdominal aorta. The sheath was removed and a standard 5 [place] vascular sheath was placed. The dilator was removed and the sheath was flushed. We then proceeded with placement of right common femoral vein central venous catheter. Ultrasound survey of the right common femoral vein was performed with images stored and sent to PACs. Ultrasound confirmed patency of the vessel. A single wall needle was used access the right common femoral vein under ultrasound. With excellent blood flow returned, a Bentson wire was passed through the needle, observed to enter the IVC under fluoroscopy. The needle was removed, and a triple-lumen central venous catheter was placed over the wire. Wire removed, and the 3 lumen on the central venous catheter were flushed and capped. Catheter was sutured in position. C2 cobra catheter was advanced on the Bentson wire through the arterial sheath, into the  abdominal aorta. Wire was removed and the catheter was used to select the celiac artery. Angiogram was performed. A standard Glidewire was attempted to navigate into the common hepatic artery for further purchase given the patient's abdominal aortic aneurysm and a tentative purchase at the celiac artery origin. This flipped the catheter into the aorta, and the catheter then engage the superior mesenteric artery. Angiogram was performed. We elected to proceed with interrogation of the SMA arterial branches which may perfuse inferior pancreaticoduodenal arteries, given the patient's hemodynamic status and urgent hemorrhage. Microcatheter and microwire, using an STC 105 cm catheter and a 14 fathom wire were navigated into the replaced right hepatic artery. Angiogram was performed. The artery was then empirically embolized, given the possibility that there was arterial contribution into inferior pancreaticoduodenal arteries supplying this region. After embolization to stasis repeat angiogram was performed. The Cobra catheter was then used to engage the celiac artery. Microwire and microcatheter were advanced through the C2 catheter into the hepatic artery. The tentative purchase at the origin did not allow microcatheter and microwire to tract within the common hepatic artery. The Cobra catheter was then removed on a Bentson wire and the 10 cm 5 French sheath was exchanged for a 35 cm 5 French sheath. C2 Cobra catheter was replaced. Again, there was poor purchase  at the celiac artery origin, and the C2 cobra catheter was exchanged for a renal double curved catheter. Once the renal double curve was in position, microcatheter microwire were used in attempt to enter the common hepatic artery. The renal double curve catheter was then exchanged for a Mickelson catheter. With the [facility] catheter formed in the lower thoracic aorta, the tip was used to engage celiac artery. This proved to have enough purchase at the origin to  advance the microcatheter and microwire into the common hepatic artery. Angiogram was performed. Microwire microcatheter were then advanced into the gastroduodenal artery. Angiogram was performed. The pseudoaneurysm flushed identified, and the microcatheter was advanced beyond the origin of the pancreaticoduodenal arcade that was supplying the region. Coil embolization was then performed of the entire length of the GDA. Gel-Foam slurry was administered at the completion with stasis achieved. Microcatheter microwire were removed and a final angiogram was performed from the base catheter. Catheter and wires were removed. We elected to then exchanged the 5 French arterial sheath for a standard 10 cm 5 French arterial sheath for arterial monitoring. Sheath was sutured in position. Sterile dressings were placed. Patient remained hemodynamically unchanged throughout the procedure, on pressor support and intubated. 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 Tortuous common hepatic artery. The gastroduodenal artery and gastroepiploic artery originate from downgoing branch of the proximal loop of the common hepatic artery. Angiogram of the SMA demonstrates replaced right hepatic artery. Attenuated arterial system in the SMA, likely related to vaso constriction/circulatory shock status Coil embolization of the replaced right hepatic artery was performed empirically, given the presence of the surgical clips in relation to the expected bleed and the possibility of pancreaticoduodenal arcade arteries from this system.  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 No flow at the conclusion. 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 Status post placement of right common femoral vein triple-lumen catheter Signed, Dulcy Fanny. Nadene Rubins, RPVI Vascular and Interventional Radiology Specialists [facility] Radiology Electronically Signed   By: Corrie Mckusick D.O.   On: 08/20/2021 10:55   IR EMBO ART  VEN HEMORR LYMPH EXTRAV  INC GUIDE ROADMAPPING  Result Date: 08/20/2021 INDICATION: 71 year old male with acute, emergent life threatening upper GI hemorrhage, after ERCP EXAM: ULTRASOUND-GUIDED ACCESS RIGHT COMMON FEMORAL ARTERY IMAGE GUIDED CENTRAL VENOUS CATHETER MESENTERIC ANGIOGRAM EMPIRIC EMBOLIZATION OF GASTRODUODENAL ARTERY EMPIRIC EMBOLIZATION OF THE REPLACED RIGHT HEPATIC ARTERY, GIVEN POSSIBLE SOURCE OF LOWER PANCREATICODUODENAL CONTRIBUTION MEDICATIONS: NONE ANESTHESIA/SEDATION: Moderate (conscious) sedation was NOT employed during this procedure. The patient was intubated under general anesthesia Total intra-service moderate Sedation Time: 0 minutes. CONTRAST:  110 cc FLUOROSCOPY: Radiation Exposure Index (as provided by the fluoroscopic device): 5329 mGy Kerma COMPLICATIONS: None PROCEDURE: Informed consent was obtained from the patient following explanation of the procedure, risks,  benefits and alternatives. The patient understands, agrees and consents for the procedure. All questions were addressed. A time out was  performed prior to the initiation of the procedure. Maximal barrier sterile technique utilized including caps, mask, sterile gowns, sterile gloves, large sterile drape, hand hygiene, and Betadine prep. Ultrasound survey of the right inguinal region was performed with images stored and sent to PACs, confirming patency of the vessel. A micropuncture needle was used access the right common femoral artery under ultrasound. With excellent arterial blood flow returned, and an .018 micro wire was passed through the needle, observed enter the abdominal aorta under fluoroscopy. The needle was removed, and a micropuncture sheath was placed over the wire. The inner dilator and wire were removed, and an 035 Bentson wire was advanced under fluoroscopy into the abdominal aorta. The sheath was removed and a standard 5 [place] vascular sheath was placed. The dilator was removed and the sheath was flushed. We then proceeded with placement of right common femoral vein central venous catheter. Ultrasound survey of the right common femoral vein was performed with images stored and sent to PACs. Ultrasound confirmed patency of the vessel. A single wall needle was used access the right common femoral vein under ultrasound. With excellent blood flow returned, a Bentson wire was passed through the needle, observed to enter the IVC under fluoroscopy. The needle was removed, and a triple-lumen central venous catheter was placed over the wire. Wire removed, and the 3 lumen on the central venous catheter were flushed and capped. Catheter was sutured in position. C2 cobra catheter was advanced on the Bentson wire through the arterial sheath, into the abdominal aorta. Wire was removed and the catheter was used to select the celiac artery. Angiogram was performed. A standard Glidewire was attempted to navigate into the common hepatic artery for further purchase given the patient's abdominal aortic aneurysm and a tentative purchase at the celiac  artery origin. This flipped the catheter into the aorta, and the catheter then engage the superior mesenteric artery. Angiogram was performed. We elected to proceed with interrogation of the SMA arterial branches which may perfuse inferior pancreaticoduodenal arteries, given the patient's hemodynamic status and urgent hemorrhage. Microcatheter and microwire, using an STC 105 cm catheter and a 14 fathom wire were navigated into the replaced right hepatic artery. Angiogram was performed. The artery was then empirically embolized, given the possibility that there was arterial contribution into inferior pancreaticoduodenal arteries supplying this region. After embolization to stasis repeat angiogram was performed. The Cobra catheter was then used to engage the celiac artery. Microwire and microcatheter were advanced through the C2 catheter into the hepatic artery. The tentative purchase at the origin did not allow microcatheter and microwire to tract within the common hepatic artery. The Cobra catheter was then removed on a Bentson wire and the 10 cm 5 French sheath was exchanged for a 35 cm 5 French sheath. C2 Cobra catheter was replaced. Again, there was poor purchase at the celiac artery origin, and the C2 cobra catheter was exchanged for a renal double curved catheter. Once the renal double curve was in position, microcatheter microwire were used in attempt to enter the common hepatic artery. The renal double curve catheter was then exchanged for a Mickelson catheter. With the [facility] catheter formed in the lower thoracic aorta, the tip was used to engage celiac artery. This proved to have enough purchase at the origin to advance the microcatheter and microwire into the common hepatic artery. Angiogram was performed. Microwire microcatheter  were then advanced into the gastroduodenal artery. Angiogram was performed. The pseudoaneurysm flushed identified, and the microcatheter was advanced beyond the origin of the  pancreaticoduodenal arcade that was supplying the region. Coil embolization was then performed of the entire length of the GDA. Gel-Foam slurry was administered at the completion with stasis achieved. Microcatheter microwire were removed and a final angiogram was performed from the base catheter. Catheter and wires were removed. We elected to then exchanged the 5 French arterial sheath for a standard 10 cm 5 French arterial sheath for arterial monitoring. Sheath was sutured in position. Sterile dressings were placed. Patient remained hemodynamically unchanged throughout the procedure, on pressor support and intubated. 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 Tortuous common hepatic artery. The gastroduodenal artery and gastroepiploic artery originate from downgoing branch of the proximal loop of the common hepatic artery. Angiogram of the SMA demonstrates replaced right hepatic artery. Attenuated arterial system in the SMA, likely related to vaso constriction/circulatory shock status Coil embolization of the replaced right hepatic artery was performed empirically, given the presence of the surgical clips in relation to the expected bleed and the possibility of pancreaticoduodenal arcade arteries from this system. 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 No flow at the  conclusion. 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 Status post placement of right common femoral vein triple-lumen catheter Signed, Dulcy Fanny. Nadene Rubins, RPVI Vascular and Interventional Radiology Specialists [facility] Radiology Electronically Signed   By: Corrie Mckusick D.O.   On: 08/20/2021 10:55   IR Angiogram Selective Each Additional Vessel  Result Date: 08/20/2021 INDICATION: 71 year old male with acute, emergent life threatening upper GI hemorrhage, after ERCP EXAM: ULTRASOUND-GUIDED ACCESS RIGHT COMMON FEMORAL ARTERY IMAGE GUIDED CENTRAL VENOUS CATHETER MESENTERIC ANGIOGRAM EMPIRIC EMBOLIZATION OF GASTRODUODENAL ARTERY EMPIRIC EMBOLIZATION OF THE REPLACED RIGHT HEPATIC ARTERY, GIVEN POSSIBLE SOURCE OF LOWER PANCREATICODUODENAL CONTRIBUTION MEDICATIONS: NONE ANESTHESIA/SEDATION: Moderate (conscious) sedation was NOT employed during this procedure. The patient was intubated under general anesthesia Total intra-service moderate Sedation Time: 0 minutes. CONTRAST:  110 cc FLUOROSCOPY: Radiation Exposure Index (as provided by the fluoroscopic device): 3716 mGy Kerma COMPLICATIONS: None PROCEDURE: Informed consent was obtained from the patient following explanation of the procedure, risks, benefits and alternatives. The patient understands, agrees and consents for the procedure. All questions were addressed. A time out was performed prior to the initiation of the procedure. Maximal barrier sterile technique utilized including caps, mask, sterile gowns, sterile gloves, large sterile drape, hand hygiene, and Betadine prep. Ultrasound survey of the right inguinal region was performed with images stored and sent to PACs, confirming  patency of the vessel. A micropuncture needle was used access the right common femoral artery under ultrasound. With excellent arterial blood flow returned, and an .018 micro wire was passed through the needle,  observed enter the abdominal aorta under fluoroscopy. The needle was removed, and a micropuncture sheath was placed over the wire. The inner dilator and wire were removed, and an 035 Bentson wire was advanced under fluoroscopy into the abdominal aorta. The sheath was removed and a standard 5 [place] vascular sheath was placed. The dilator was removed and the sheath was flushed. We then proceeded with placement of right common femoral vein central venous catheter. Ultrasound survey of the right common femoral vein was performed with images stored and sent to PACs. Ultrasound confirmed patency of the vessel. A single wall needle was used access the right common femoral vein under ultrasound. With excellent blood flow returned, a Bentson wire was passed through the needle, observed to enter the IVC under fluoroscopy. The needle was removed, and a triple-lumen central venous catheter was placed over the wire. Wire removed, and the 3 lumen on the central venous catheter were flushed and capped. Catheter was sutured in position. C2 cobra catheter was advanced on the Bentson wire through the arterial sheath, into the abdominal aorta. Wire was removed and the catheter was used to select the celiac artery. Angiogram was performed. A standard Glidewire was attempted to navigate into the common hepatic artery for further purchase given the patient's abdominal aortic aneurysm and a tentative purchase at the celiac artery origin. This flipped the catheter into the aorta, and the catheter then engage the superior mesenteric artery. Angiogram was performed. We elected to proceed with interrogation of the SMA arterial branches which may perfuse inferior pancreaticoduodenal arteries, given the patient's hemodynamic status and  urgent hemorrhage. Microcatheter and microwire, using an STC 105 cm catheter and a 14 fathom wire were navigated into the replaced right hepatic artery. Angiogram was performed. The artery was then empirically embolized, given the possibility that there was arterial contribution into inferior pancreaticoduodenal arteries supplying this region. After embolization to stasis repeat angiogram was performed. The Cobra catheter was then used to engage the celiac artery. Microwire and microcatheter were advanced through the C2 catheter into the hepatic artery. The tentative purchase at the origin did not allow microcatheter and microwire to tract within the common hepatic artery. The Cobra catheter was then removed on a Bentson wire and the 10 cm 5 French sheath was exchanged for a 35 cm 5 French sheath. C2 Cobra catheter was replaced. Again, there was poor purchase at the celiac artery origin, and the C2 cobra catheter was exchanged for a renal double curved catheter. Once the renal double curve was in position, microcatheter microwire were used in attempt to enter the common hepatic artery. The renal double curve catheter was then exchanged for a Mickelson catheter. With the [facility] catheter formed in the lower thoracic aorta, the tip was used to engage celiac artery. This proved to have enough purchase at the origin to advance the microcatheter and microwire into the common hepatic artery. Angiogram was performed. Microwire microcatheter were then advanced into the gastroduodenal artery. Angiogram was performed. The pseudoaneurysm flushed identified, and the microcatheter was advanced beyond the origin of the pancreaticoduodenal arcade that was supplying the region. Coil embolization was then performed of the entire length of the GDA. Gel-Foam slurry was administered at the completion with stasis achieved. Microcatheter microwire were removed and a final angiogram was performed from the base catheter. Catheter and  wires were removed. We elected to then exchanged the 5 French arterial sheath for a standard 10 cm 5 French arterial sheath for arterial monitoring. Sheath was sutured in  position. Sterile dressings were placed. Patient remained hemodynamically unchanged throughout the procedure, on pressor support and intubated. 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 Tortuous common hepatic artery. The gastroduodenal artery and gastroepiploic artery originate from downgoing branch of the proximal loop of the common hepatic artery. Angiogram of the SMA demonstrates replaced right hepatic artery. Attenuated arterial system in the SMA, likely related to vaso constriction/circulatory shock status Coil embolization of the replaced right hepatic artery was performed empirically, given the presence of the surgical clips in relation to the expected bleed and the possibility of pancreaticoduodenal arcade arteries from this system. 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 No flow at the conclusion. 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 Status post placement of right common femoral vein triple-lumen catheter Signed, Dulcy Fanny. Nadene Rubins, RPVI Vascular and Interventional Radiology Specialists [facility] Radiology Electronically Signed   By: Corrie Mckusick D.O.   On: 08/20/2021 10:55   IR US Guide Vasc Access Right  Result Date: 08/20/2021 INDICATION: 71 year old male with acute, emergent life threatening upper GI hemorrhage, after ERCP EXAM: ULTRASOUND-GUIDED ACCESS RIGHT COMMON FEMORAL ARTERY IMAGE GUIDED CENTRAL VENOUS CATHETER MESENTERIC ANGIOGRAM EMPIRIC EMBOLIZATION OF GASTRODUODENAL ARTERY EMPIRIC EMBOLIZATION OF THE REPLACED RIGHT HEPATIC ARTERY, GIVEN POSSIBLE SOURCE OF LOWER PANCREATICODUODENAL CONTRIBUTION MEDICATIONS: NONE ANESTHESIA/SEDATION: Moderate (conscious) sedation was NOT employed during this procedure. The patient was intubated under general anesthesia Total intra-service moderate Sedation Time: 0 minutes. CONTRAST:  110 cc FLUOROSCOPY: Radiation Exposure Index (as provided by the fluoroscopic device): 6269 mGy Kerma COMPLICATIONS: None PROCEDURE: Informed consent was obtained from the patient following explanation of the procedure, risks, benefits and alternatives. The patient understands, agrees and consents for the procedure. All questions were addressed. A time out was performed prior to the initiation of the procedure. Maximal barrier sterile technique utilized including caps, mask, sterile gowns, sterile gloves, large sterile drape, hand hygiene, and Betadine prep. Ultrasound survey of the right inguinal region was performed with images stored and sent to PACs, confirming patency of the vessel. A micropuncture needle was used access the right common femoral artery under ultrasound. With excellent arterial blood flow returned, and an .018 micro wire was passed through the needle, observed enter the abdominal aorta under fluoroscopy. The needle was removed, and a micropuncture sheath was  placed over the wire. The inner dilator and wire were removed, and an 035 Bentson wire was advanced under fluoroscopy into the abdominal aorta. The sheath was removed and a standard 5 [place] vascular sheath was placed. The dilator was removed and the sheath was flushed. We then proceeded with placement of right common femoral vein central venous catheter. Ultrasound survey of the right common femoral vein was performed with images stored and sent to PACs. Ultrasound confirmed patency of the vessel. A  single wall needle was used access the right common femoral vein under ultrasound. With excellent blood flow returned, a Bentson wire was passed through the needle, observed to enter the IVC under fluoroscopy. The needle was removed, and a triple-lumen central venous catheter was placed over the wire. Wire removed, and the 3 lumen on the central venous catheter were flushed and capped. Catheter was sutured in position. C2 cobra catheter was advanced on the Bentson wire through the arterial sheath, into the abdominal aorta. Wire was removed and the catheter was used to select the celiac artery. Angiogram was performed. A standard Glidewire was attempted to navigate into the common hepatic artery for further purchase given the patient's abdominal aortic aneurysm and a tentative purchase at the celiac artery origin. This flipped the catheter into the aorta, and the catheter then engage the superior mesenteric artery. Angiogram was performed. We elected to proceed with interrogation of the SMA arterial branches which may perfuse inferior pancreaticoduodenal arteries, given the patient's hemodynamic status and urgent hemorrhage. Microcatheter and microwire, using an STC 105 cm catheter and a 14 fathom wire were navigated into the replaced right hepatic artery. Angiogram was performed. The artery was then empirically embolized, given the possibility that there was arterial contribution into inferior pancreaticoduodenal  arteries supplying this region. After embolization to stasis repeat angiogram was performed. The Cobra catheter was then used to engage the celiac artery. Microwire and microcatheter were advanced through the C2 catheter into the hepatic artery. The tentative purchase at the origin did not allow microcatheter and microwire to tract within the common hepatic artery. The Cobra catheter was then removed on a Bentson wire and the 10 cm 5 French sheath was exchanged for a 35 cm 5 French sheath. C2 Cobra catheter was replaced. Again, there was poor purchase at the celiac artery origin, and the C2 cobra catheter was exchanged for a renal double curved catheter. Once the renal double curve was in position, microcatheter microwire were used in attempt to enter the common hepatic artery. The renal double curve catheter was then exchanged for a Mickelson catheter. With the [facility] catheter formed in the lower thoracic aorta, the tip was used to engage celiac artery. This proved to have enough purchase at the origin to advance the microcatheter and microwire into the common hepatic artery. Angiogram was performed. Microwire microcatheter were then advanced into the gastroduodenal artery. Angiogram was performed. The pseudoaneurysm flushed identified, and the microcatheter was advanced beyond the origin of the pancreaticoduodenal arcade that was supplying the region. Coil embolization was then performed of the entire length of the GDA. Gel-Foam slurry was administered at the completion with stasis achieved. Microcatheter microwire were removed and a final angiogram was performed from the base catheter. Catheter and wires were removed. We elected to then exchanged the 5 French arterial sheath for a standard 10 cm 5 French arterial sheath for arterial monitoring. Sheath was sutured in position. Sterile dressings were placed. Patient remained hemodynamically unchanged throughout the procedure, on pressor support and intubated.  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 Tortuous common hepatic artery. The gastroduodenal artery and gastroepiploic artery originate from downgoing branch of the proximal  loop of the common hepatic artery. Angiogram of the SMA demonstrates replaced right hepatic artery. Attenuated arterial system in the SMA, likely related to vaso constriction/circulatory shock status Coil embolization of the replaced right hepatic artery was performed empirically, given the presence of the surgical clips in relation to the expected bleed and the possibility of pancreaticoduodenal arcade arteries from this system. 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 No flow at the conclusion. 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 Status post placement of right common femoral vein triple-lumen catheter Signed, Dulcy Fanny. Nadene Rubins, RPVI Vascular and Interventional Radiology Specialists [facility] Radiology Electronically Signed   By: Corrie Mckusick D.O.   On: 08/20/2021 10:55   DG CHEST PORT 1 VIEW  Result Date:  08/20/2021 CLINICAL DATA:  Intubated EXAM: PORTABLE CHEST 1 VIEW COMPARISON:  CTA chest dated 06/18/2021 FINDINGS: Right basilar atelectasis. Left lung is clear. No pleural effusion or pneumothorax. Endotracheal tube terminates 3 cm above the carina. Heart is normal in size.  Prosthetic aortic valve. Median sternotomy. IMPRESSION: Endotracheal tube terminates 3 cm above the carina. Electronically Signed   By: Julian Hy M.D.   On: 08/20/2021 02:19   CT ANGIO GI BLEED  Addendum Date: 08/19/2021   ADDENDUM REPORT: 08/19/2021 20:39 ADDENDUM: These results were called by telephone at the time of interpretation on 08/19/2021 at 8:35 pm to provider Dr Oletta Darter, who verbally acknowledged these results. Electronically Signed   By: Julian Hy M.D.   On: 08/19/2021 20:39   Result Date: 08/19/2021 CLINICAL DATA:  Lower GI bleed EXAM: CTA ABDOMEN AND PELVIS WITHOUT AND WITH CONTRAST TECHNIQUE: Multidetector CT imaging of the abdomen and pelvis was performed using the standard protocol during bolus administration of intravenous contrast. Multiplanar reconstructed images and MIPs were obtained and reviewed to evaluate the vascular anatomy. RADIATION DOSE REDUCTION: This exam was performed according to the departmental dose-optimization program which includes automated exposure control, adjustment of the mA and/or kV according to patient size and/or use of iterative reconstruction technique. CONTRAST:  117m OMNIPAQUE IOHEXOL 350 MG/ML SOLN COMPARISON:  MRI abdomen dated 08/17/2021. CT abdomen/pelvis dated 08/16/2021. FINDINGS: VASCULAR Aorta: 4.9 x 5.1 cm infrarenal abdominal aortic aneurysm. Atherosclerotic calcifications of the aortic arch. Patent. Celiac: Patent. SMA: Patent. Renals: Patent bilaterally. IMA: Patent. Inflow: Patent bilaterally. Atherosclerotic calcifications. 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 3.7 cm short axis left common iliac artery aneurysm. Proximal Outflow: Patent.  Veins: Unremarkable. Active intravasation of contrast within the lumen of the 2nd portion of the duodenum (series 5/images 91 and 94). This reflects the site of the patient's active GI bleeding. Associated findings described below. Review of the MIP images confirms the above findings. NON-VASCULAR Lower chest: Lung bases are clear.  Prosthetic aortic valve. Hepatobiliary: Subcentimeter hepatic cysts. Mild intrahepatic and extrahepatic ductal dilatation. Hyperdense material within the common duct (series 7/image 31), possibly postprocedural versus hemorrhage. Pancreas: Within normal limits. Spleen: Within normal limits. Adrenals/Urinary Tract: Adrenal glands are within normal limits. Bilateral renal cysts, most of which are simple, including a 5.0 cm right lower pole renal sinus cyst (series 7/image 34). Additional 2.1  cm lateral left upper pole cyst with calcified septations (series 2/image 30), but no enhancement following contrast administration, benign (Bosniak II). No renal calculi or hydronephrosis. Bladder is within normal limits. Stomach/Bowel: Enteric tube terminates in the distal gastric body. 4.3 x 6.3 cm hematoma expanding the 1st and 2nd portion of the duodenum (series 2/image 38) and an adjacent duodenal diverticulum (coronal image 68). This corresponds to the site of active GI bleeding identified above. Hyperdense material/hemorrhage extends into the 3rd portion of the duodenum (series 2/image 42). No evidence of bowel obstruction. Normal appendix (series 5/image 120). Extensive sigmoid diverticulosis, without evidence of diverticulitis. Lymphatic: Small upper abdominal lymph nodes. No suspicious abdominopelvic lymphadenopathy. Reproductive: Prostate is unremarkable. Other: No abdominopelvic ascites. Musculoskeletal: Mild degenerative changes the lumbar spine. Median sternotomy. IMPRESSION: Active GI bleeding within the 2nd portion the duodenum. Associated large hematoma. Hyperdense material in the  common duct, possibly postprocedural versus hemorrhage given additional findings. Associated mild intrahepatic and extrahepatic ductal dilatation. 5.1 cm infrarenal abdominal aortic aneurysm. 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 Consider vascular surgery consultation. Aortic Atherosclerosis (ICD10-I70.0). Electronically Signed: By: Julian Hy M.D. On: 08/19/2021 20:25   DG Abd 1 View  Result Date: 08/19/2021 CLINICAL DATA:  Encounter for nasogastric tube placement. EXAM: ABDOMEN - 1 VIEW COMPARISON:  CT abdomen and pelvis 08/16/2021 FINDINGS: AP portable view of the abdomen was obtained. Nasogastric tube extends into the abdomen and the tip is in the gastric body region. Evidence for cholecystectomy clips. Evidence for embolization coils in the upper pelvis. Nonobstructive bowel gas pattern. Severe joint space loss along the superior right hip joint with remodeling in the right femoral head. IMPRESSION: 1. Nasogastric tube tip in the stomach body region. 2. Severe joint space loss and degenerative changes in the right hip. Electronically Signed   By: Markus Daft M.D.   On: 08/19/2021 16:20   DG ERCP  Result Date: 08/19/2021 CLINICAL DATA:  Suspected choledocholithiasis EXAM: ERCP TECHNIQUE: Multiple spot images obtained with the fluoroscopic device and submitted for interpretation post-procedure. COMPARISON:  MRCP 08/17/2021 FINDINGS: Series of fluoroscopic spot images document endoscopic cannulation and opacification of the CBD. Filling defects are noted in the distal CBD consistent with choledocholithiasis. Subsequent image demonstrates balloon catheter placement into the common bile duct. The intrahepatic ducts are incompletely visualized, mildly dilated centrally. Cholecystectomy clips. No extravasation evident. IMPRESSION: Endoscopic CBD cannulation and intervention as above. These images were submitted for radiologic interpretation only. Please see the procedural  report for the amount of contrast and the fluoroscopy time utilized. Electronically Signed   By: Lucrezia Europe M.D.   On: 08/19/2021 14:16    Scheduled Meds:    amLODipine  10 mg Oral Daily   atenolol  12.5 mg Oral BID   Chlorhexidine Gluconate Cloth  6 each Topical Daily   pantoprazole (PROTONIX) IV  40 mg Intravenous Q12H   sodium chloride flush  3 mL Intravenous Q12H    Continuous Infusions:    sodium chloride     cefTRIAXone (ROCEPHIN)  IV Stopped (08/20/21 1051)     LOS: 4 days     Vernell Leep, MD,  FACP, [facility], [facility], [facility] (Care Management Physician Certified) [place]  To contact the attending provider between 7A-7P or the covering provider during after hours 7P-7A, please log into the web site [email or web address] and access using universal [place] password for that web site. If you do not have the password, please call the hospital  operator.  08/21/2021, 10:34 AM

## 2021-08-21 NOTE — Progress Notes (Signed)
Lonnie Hansen 9:25 AM  Subjective: Patient doing well without any pain and only passing some old blood and is thirsty and wants to drink and we reviewed his procedures including the ERCP sphincterotomy and stone extraction as well as in the follow-up endoscopy and angiogram and answered all of his questions  Objective: Vital signs stable afebrile no acute distress in good spirits abdomen is soft nontender BUN and creatinine improved liver test improved white count improved slight hemoglobin drop expected to continue to drop a little as BUN and creatinine improved further  Assessment: Status post duodenal bleeding questionably from pseudoaneurysm status post coiling  Plan: We will allow clear liquids and we will have rounding team check on tomorrow and will follow liver test back to normal as an outpatient and thanks to critical care team for their care  [facility]  office [phone] After 5PM or if no answer call [phone]

## 2021-08-21 NOTE — Progress Notes (Signed)
PT transferred to room 1412 via wheelchair by [facility]. Telemetry notified of transfer, paperwork and all items collected from room and transported up with the patient.

## 2021-08-21 NOTE — Anesthesia Postprocedure Evaluation (Signed)
Anesthesia Post Note  Patient: Lonnie Hansen  Procedure(s) Performed: ESOPHAGOGASTRODUODENOSCOPY (EGD) HEMOSTASIS CONTROL     Patient location during evaluation: SICU Anesthesia Type: General Level of consciousness: sedated Pain management: pain level controlled Vital Signs Assessment: post-procedure vital signs reviewed and stable Respiratory status: patient remains intubated per anesthesia plan Cardiovascular status: stable Postop Assessment: no apparent nausea or vomiting Anesthetic complications: no   No notable events documented.  Last Vitals:  Vitals:   08/21/21 0500 08/21/21 0600  BP: 134/78 138/85  Pulse: 85 99  Resp: 13 (!) 0  Temp: 37.3 C 37.4 C  SpO2: 100% 100%    Last Pain:  Vitals:   08/21/21 0000  TempSrc:   PainSc: [place]

## 2021-08-22 ENCOUNTER — Encounter (HOSPITAL_COMMUNITY): Payer: Self-pay | Admitting: Certified Registered"

## 2021-08-22 ENCOUNTER — Inpatient Hospital Stay (HOSPITAL_COMMUNITY): Payer: Medicare Other

## 2021-08-22 LAB — CBC
HCT: 22.9 % — ABNORMAL LOW (ref 39.0–52.0)
HCT: 20.5 % — ABNORMAL LOW (ref 39.0–52.0)
Hemoglobin: 7.5 g/dL — ABNORMAL LOW (ref 13.0–17.0)
Hemoglobin: 6.8 g/dL — CL (ref 13.0–17.0)
MCH: 30.1 pg (ref 26.0–34.0)
MCH: 30.1 pg (ref 26.0–34.0)
MCHC: 32.8 g/dL (ref 30.0–36.0)
MCHC: 33.2 g/dL (ref 30.0–36.0)
MCV: 92 fL (ref 80.0–100.0)
MCV: 90.7 fL (ref 80.0–100.0)
Platelets: 101 10*3/uL — ABNORMAL LOW (ref 150–400)
Platelets: 96 10*3/uL — ABNORMAL LOW (ref 150–400)
RBC: 2.49 MIL/uL — ABNORMAL LOW (ref 4.22–5.81)
RBC: 2.26 MIL/uL — ABNORMAL LOW (ref 4.22–5.81)
RDW: 16.1 % — ABNORMAL HIGH (ref 11.5–15.5)
RDW: 15.9 % — ABNORMAL HIGH (ref 11.5–15.5)
WBC: 13.6 10*3/uL — ABNORMAL HIGH (ref 4.0–10.5)
WBC: 12.8 10*3/uL — ABNORMAL HIGH (ref 4.0–10.5)
nRBC: 0 % (ref 0.0–0.2)
nRBC: 0.2 % (ref 0.0–0.2)

## 2021-08-22 LAB — PHOSPHORUS: Phosphorus: 2.3 mg/dL — ABNORMAL LOW (ref 2.5–4.6)

## 2021-08-22 LAB — COMPREHENSIVE METABOLIC PANEL
ALT: 109 U/L — ABNORMAL HIGH (ref 0–44)
AST: 55 U/L — ABNORMAL HIGH (ref 15–41)
Albumin: 2.9 g/dL — ABNORMAL LOW (ref 3.5–5.0)
Alkaline Phosphatase: 117 U/L (ref 38–126)
Anion gap: 8 (ref 5–15)
BUN: 29 mg/dL — ABNORMAL HIGH (ref 8–23)
CO2: 25 mmol/L (ref 22–32)
Calcium: 8 mg/dL — ABNORMAL LOW (ref 8.9–10.3)
Chloride: 107 mmol/L (ref 98–111)
Creatinine, Ser: 0.99 mg/dL (ref 0.61–1.24)
GFR, Estimated: >60 mL/min (ref >60)
Glucose, Bld: 101 mg/dL — ABNORMAL HIGH (ref 70–99)
Potassium: 3.7 mmol/L (ref 3.5–5.1)
Sodium: 140 mmol/L (ref 135–145)
Total Bilirubin: 1.1 mg/dL (ref 0.3–1.2)
Total Protein: 5.5 g/dL — ABNORMAL LOW (ref 6.5–8.1)

## 2021-08-22 LAB — PREPARE RBC (CROSSMATCH)

## 2021-08-22 LAB — GLUCOSE, CAPILLARY
Glucose-Capillary: 98 mg/dL (ref 70–99)
Glucose-Capillary: 93 mg/dL (ref 70–99)
Glucose-Capillary: 97 mg/dL (ref 70–99)
Glucose-Capillary: 87 mg/dL (ref 70–99)
Glucose-Capillary: 81 mg/dL (ref 70–99)

## 2021-08-22 MED ORDER — SODIUM PHOSPHATES 45 MMOLE/15ML IV SOLN
15.0000 mmol | Freq: Once | INTRAVENOUS | Status: AC
  Administered 2021-08-22: 15 mmol via INTRAVENOUS
  Filled 2021-08-22: qty 5

## 2021-08-22 MED ORDER — LIDOCAINE HCL 1 % IJ SOLN
INTRAMUSCULAR | Status: DC | PRN
  Administered 2021-08-22: 5 mL

## 2021-08-22 MED ORDER — HEPARIN SOD (PORK) LOCK FLUSH 100 UNIT/ML IV SOLN
INTRAVENOUS | Status: AC
  Filled 2021-08-22: qty 5

## 2021-08-22 MED ORDER — SODIUM CHLORIDE 0.9% IV SOLUTION
Freq: Once | INTRAVENOUS | Status: AC
Start: 2021-08-22 — End: 2021-08-22

## 2021-08-22 MED ORDER — POTASSIUM CHLORIDE 10 MEQ/100ML IV SOLN
10.0000 meq | INTRAVENOUS | Status: AC
  Administered 2021-08-22 (×4): 10 meq via INTRAVENOUS
  Filled 2021-08-22: qty 100

## 2021-08-22 MED ORDER — LIDOCAINE HCL 1 % IJ SOLN
INTRAMUSCULAR | Status: AC
  Filled 2021-08-22: qty 20

## 2021-08-22 MED ORDER — MAGNESIUM SULFATE 2 GM/50ML IV SOLN
INTRAVENOUS | Status: AC
  Filled 2021-08-22: qty 50

## 2021-08-22 MED ORDER — DEXTROSE-NACL 5-0.9 % IV SOLN: INTRAVENOUS | Status: DC

## 2021-08-22 NOTE — Progress Notes (Signed)
Pharmacy Note:  Pharmacy may replace electrolytes per [facility]ink protocol  Today, 08/22/21 BMET    Component Value Date/Time   NA 140 08/22/2021 0045   NA 140 05/11/2019 0946   K 3.7 08/22/2021 0045   CL 107 08/22/2021 0045   CO2 25 08/22/2021 0045   GLUCOSE 101 (H) 08/22/2021 0045   BUN 29 (H) 08/22/2021 0045   BUN 17 05/11/2019 0946   CREATININE 0.99 08/22/2021 0045   CREATININE 1.28 (H) 08/13/2021 1410   CREATININE 0.75 04/10/2016 1132   CALCIUM 8.0 (L) 08/22/2021 0045   GFRNONAA >60 08/22/2021 0045   GFRNONAA 60 (L) 08/13/2021 1410   Plan: KCl 10 mEq IV q1h x 4 Recheck BMET with AM labs    Thank you for allowing pharmacy to be a part of this patient's care.  Royetta Asal, PharmD, BCPS Clinical Pharmacist [place] Please utilize Amion for appropriate phone number to reach the unit pharmacist ([place]) 08/22/2021 10:18 AM

## 2021-08-22 NOTE — Procedures (Signed)
PROCEDURE SUMMARY:  Successful placement of double lumen PICC line to right brachial vein. Length 39 cm Tip at lower SVC/RA PICC capped No complications Ready for use  EBL < 5 mL   Wood Novacek H Gumaro Brightbill PA-C 08/22/2021, 11:50 AM

## 2021-08-22 NOTE — Progress Notes (Addendum)
[facility] Gastroenterology Progress Note  Lonnie Hansen 71 y.o. 1950-11-18  Subjective: Seen and examined laying in bed.  Notes continued melena 1 episode last night.  Denies hematochezia, nausea, vomiting.  ROS : Review of Systems  Gastrointestinal:  Positive for melena. Negative for abdominal pain, blood in stool, constipation, diarrhea, heartburn, nausea and vomiting.  Genitourinary:  Negative for dysuria and urgency.    Objective: Vital signs in last 24 hours: Vitals:   08/22/21 0231 08/22/21 0646  BP: 97/63 (!) 108/59  Pulse: 66 64  Resp: 16 19  Temp: 98.8 F (37.1 C) 98.7 F (37.1 C)  SpO2: 98% 98%    Physical Exam:  General:  Alert, cooperative, no distress, appears stated age, pale  Head:  Normocephalic, without obvious abnormality, atraumatic  Eyes:  Anicteric sclera, EOM's intact, conjunctival pallor  Lungs:   Clear to auscultation bilaterally, respirations unlabored  Heart:  Regular rate and rhythm, S1, S2 normal  Abdomen:   Soft, non-tender, bowel sounds active all four quadrants,  no masses,   Extremities: Extremities normal, atraumatic, no  edema  Pulses: 2+ and symmetric    Lab Results: Recent Labs    08/20/21 0612 08/20/21 1836 08/21/21 0330 08/22/21 0045  NA 136   < > 140 140  K 5.2*   < > 4.1 3.7  CL 105   < > 109 107  CO2 20*   < > 23 25  GLUCOSE 170*   < > 139* 101*  BUN 27*   < > 39* 29*  CREATININE 1.82*   < > 1.46* 0.99  CALCIUM 7.7*   < > 7.8* 8.0*  MG 1.9  --   --   --   PHOS 5.2*  --   --  2.3*   < > = values in this interval not displayed.   Recent Labs    08/21/21 0330 08/22/21 0045  AST 82* 55*  ALT 153* 109*  ALKPHOS 146* 117  BILITOT 1.4* 1.1  PROT 5.5* 5.5*  ALBUMIN 2.8* 2.9*   Recent Labs    08/22/21 0045 08/22/21 0632  WBC 13.6* 12.8*  HGB 7.5* 6.8*  HCT 22.9* 20.5*  MCV 92.0 90.7  PLT 101* 96*   Recent Labs    08/20/21 1836 08/21/21 0330  LABPROT 14.7 14.5  INR 1.2 1.1       Assessment Choledocholithiasis Acute GI bleed ITP   Hemoglobin stable at 6.8(7.5) this morning.  Patient receiving transfusion today.  Melena.  Possible melena from old blood previous GI bleed versus recurrent GI bleed.  He is currently hemodynamically stable.  Suspect old blood versus new GI bleed at this time.  We will continue to monitor closely for continued drop in hemoglobin or hemodynamic instability.  BUN is improving.   HGB 6.8(7.5) Platelets 96 AST 55 ALT 109  Alkphos 117 TBili 1.1   LFTs continuing to downtrend after ERCP.      Plan: We will continue to monitor closely and cautiously for further decrease in hemoglobin and hemodynamic instability. Continue to monitor hemoglobin and transfuse if less than 7. If change in hemodynamic stability is present would plan to repeat EGD. We will keep n.p.o. for now. [facility] will follow.  Arvella Nigh Omer Puccinelli PA-C 08/22/2021, 9:26 AM  Contact #  [phone]

## 2021-08-22 NOTE — Progress Notes (Signed)
Refused cpap QHS.

## 2021-08-22 NOTE — H&P (View-Only) (Signed)
[facility] Gastroenterology Progress Note  Lonnie Hansen 71 y.o. 10/11/1950  Subjective: Seen and examined laying in bed.  Notes continued melena 1 episode last night.  Denies hematochezia, nausea, vomiting.  ROS : Review of Systems  Gastrointestinal:  Positive for melena. Negative for abdominal pain, blood in stool, constipation, diarrhea, heartburn, nausea and vomiting.  Genitourinary:  Negative for dysuria and urgency.    Objective: Vital signs in last 24 hours: Vitals:   08/22/21 0231 08/22/21 0646  BP: 97/63 (!) 108/59  Pulse: 66 64  Resp: 16 19  Temp: 98.8 F (37.1 C) 98.7 F (37.1 C)  SpO2: 98% 98%    Physical Exam:  General:  Alert, cooperative, no distress, appears stated age, pale  Head:  Normocephalic, without obvious abnormality, atraumatic  Eyes:  Anicteric sclera, EOM's intact, conjunctival pallor  Lungs:   Clear to auscultation bilaterally, respirations unlabored  Heart:  Regular rate and rhythm, S1, S2 normal  Abdomen:   Soft, non-tender, bowel sounds active all four quadrants,  no masses,   Extremities: Extremities normal, atraumatic, no  edema  Pulses: 2+ and symmetric    Lab Results: Recent Labs    08/20/21 0612 08/20/21 1836 08/21/21 0330 08/22/21 0045  NA 136   < > 140 140  K 5.2*   < > 4.1 3.7  CL 105   < > 109 107  CO2 20*   < > 23 25  GLUCOSE 170*   < > 139* 101*  BUN 27*   < > 39* 29*  CREATININE 1.82*   < > 1.46* 0.99  CALCIUM 7.7*   < > 7.8* 8.0*  MG 1.9  --   --   --   PHOS 5.2*  --   --  2.3*   < > = values in this interval not displayed.   Recent Labs    08/21/21 0330 08/22/21 0045  AST 82* 55*  ALT 153* 109*  ALKPHOS 146* 117  BILITOT 1.4* 1.1  PROT 5.5* 5.5*  ALBUMIN 2.8* 2.9*   Recent Labs    08/22/21 0045 08/22/21 0632  WBC 13.6* 12.8*  HGB 7.5* 6.8*  HCT 22.9* 20.5*  MCV 92.0 90.7  PLT 101* 96*   Recent Labs    08/20/21 1836 08/21/21 0330  LABPROT 14.7 14.5  INR 1.2 1.1       Assessment Choledocholithiasis Acute GI bleed ITP   Hemoglobin stable at 6.8(7.5) this morning.  Patient receiving transfusion today.  Melena.  Possible melena from old blood previous GI bleed versus recurrent GI bleed.  He is currently hemodynamically stable.  Suspect old blood versus new GI bleed at this time.  We will continue to monitor closely for continued drop in hemoglobin or hemodynamic instability.  BUN is improving.   HGB 6.8(7.5) Platelets 96 AST 55 ALT 109  Alkphos 117 TBili 1.1   LFTs continuing to downtrend after ERCP.      Plan: We will continue to monitor closely and cautiously for further decrease in hemoglobin and hemodynamic instability. Continue to monitor hemoglobin and transfuse if less than 7. If change in hemodynamic stability is present would plan to repeat EGD. We will keep n.p.o. for now. [facility] will follow.  Arvella Nigh Markeda Narvaez PA-C 08/22/2021, 9:26 AM  Contact #  [phone]

## 2021-08-22 NOTE — Progress Notes (Signed)
Physical Therapy Treatment Patient Details Name: Lonnie Hansen MRN: [id number] DOB: 1951-01-13 Today's Date: 08/22/2021   History of Present Illness Pt is a 71 y.o. male admitted 08/16/21 with RUQ pain and dx with transaminitis and hemorrhage of GI tract.  Pt s/p ERCP and sphincterotomy 08/19/21  requiring intubation following.  Pt extubated 08/20/21.  PMH significant for diverticulitis, AAA, AVR, chronic ITP, GERD, hyperlipidemia, OSA on CPAP, hypertension, GI bleeding, and CAD.    PT Comments    Pt able to participate with transfers and ambulated 150' with min guard and cues for posture.  Further treatment limited by pt fatigued (reports hasn't ate in days and low hgb getting PRBC) and R hip pain (OA).  Continue to progress as able.   Pt asking about steriod injection for R hip - notified MD.     Recommendations for follow up therapy are one component of a multi-disciplinary discharge planning process, led by the attending physician.  Recommendations may be updated based on patient status, additional functional criteria and insurance authorization.  Follow Up Recommendations  No PT follow up     Assistance Recommended at Discharge Frequent or constant Supervision/Assistance  Patient can return home with the following A little help with walking and/or transfers;A little help with bathing/dressing/bathroom;Assistance with cooking/housework;Assist for transportation;Help with stairs or ramp for entrance   Equipment Recommendations       Recommendations for Other Services       Precautions / Restrictions       Mobility  Bed Mobility Overal bed mobility: Needs Assistance Bed Mobility: Supine to Sit, Sit to Supine     Supine to sit: Min guard Sit to supine: Min guard        Transfers Overall transfer level: Needs assistance Equipment used: Rolling walker (2 wheels) Transfers: Sit to/from Stand Sit to Stand: Min guard                Ambulation/Gait Ambulation/Gait  assistance: Min guard Gait Distance (Feet): 180 Feet Assistive device: Rolling walker (2 wheels) Gait Pattern/deviations: Step-to pattern, Trunk flexed       General Gait Details: cues for posture and RW use   Stairs             Wheelchair Mobility    Modified Rankin (Stroke Patients Only)       Balance Overall balance assessment: Needs assistance Sitting-balance support: No upper extremity supported, Feet supported Sitting balance-Leahy Scale: Good     Standing balance support: Bilateral upper extremity supported, No upper extremity supported Standing balance-Leahy Scale: Fair Standing balance comment: RW to ambulate but could static stand                            Cognition Arousal/Alertness: Awake/alert Behavior During Therapy: WFL for tasks assessed/performed Overall Cognitive Status: Within Functional Limits for tasks assessed                                          Exercises      General Comments General comments (skin integrity, edema, etc.): Pt's hgb 6.8, received 1 unit PRBC earlier and getting 2nd unit during therapy      Pertinent Vitals/Pain      Home Living  Prior Function            PT Goals (current goals can now be found in the care plan section) Progress towards PT goals: Progressing toward goals    Frequency    Min 3X/week      PT Plan Current plan remains appropriate    Co-evaluation              AM-PAC PT "6 Clicks" Mobility   Outcome Measure  Help needed turning from your back to your side while in a flat bed without using bedrails?: A Little Help needed moving from lying on your back to sitting on the side of a flat bed without using bedrails?: A Little Help needed moving to and from a bed to a chair (including a wheelchair)?: A Little Help needed standing up from a chair using your arms (e.g., wheelchair or bedside chair)?: A Little Help needed  to walk in hospital room?: A Little Help needed climbing 3-5 steps with a railing? : A Little 6 Click Score: 18    End of Session Equipment Utilized During Treatment: Gait belt Activity Tolerance: Patient limited by fatigue;Patient limited by pain Patient left: in bed;with call bell/phone within reach;with bed alarm set Nurse Communication: Mobility status PT Visit Diagnosis: Unsteadiness on feet (R26.81);Muscle weakness (generalized) (M62.81);Difficulty in walking, not elsewhere classified (R26.2)     Time: 3007-6226 PT Time Calculation (min) (ACUTE ONLY): 22 min  Charges:  $Gait Training: 8-22 mins                     Abran Richard, PT Acute Rehab [facility] [place] 08/22/2021, 4:22 PM

## 2021-08-22 NOTE — Care Management Important Message (Signed)
Important Message  Patient Details IM Letter given to the Patient. Name: Lonnie Hansen MRN: [id number] Date of Birth: 1950-02-25   Medicare Important Message Given:  Yes     Kerin Salen 08/22/2021, 11:12 AM

## 2021-08-22 NOTE — Progress Notes (Signed)
PROGRESS NOTE   Lonnie Hansen  AQT:[id number]    DOB: 1950/04/15    DOA: 08/16/2021  PCP: [name], MD   I have briefly reviewed patients previous medical records in [facility].  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  CT abdomen in the ED showed thickening of the CBD wall and increased AAA size.  He underwent MRCP which was concerning for choledocholithiasis.  [facility] was consulted and performed ERCP on 7/25 which was initially uncomplicated, however in the postprocedural setting the patient developed hematemesis and hematochezia, subsequently became diaphoretic and less responsive with ectopy on telemetry.  He was transferred to ICU and intubated.  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  Patient extubated and stable.  Transferred from PCCM to [facility] on 7/27.  Large maroon BM 7/27 late afternoon, gradual progressive anemia since then, Hb 6.8 on 7/28, transfusing 2 units PRBC, GI following.   Assessment & Plan:  Principal Problem:   Elevated LFTs Active Problems:   Chronic ITP (idiopathic thrombocytopenia) (HCC)   Hyperlipidemia   History of CAD   Hypertension   Aneurysm of infrarenal abdominal aorta (HCC)   OSA on CPAP   AKI (acute kidney injury) ([place])   Choledocholithiasis   Acute blood loss anemia (ABLA)   Hyperkalemia   Acute cholangitis   Acute gastrointestinal hemorrhage:  occurred in the postprocedural setting  following ERCP, sphincterotomy from site of CBD.  Now s/p coil embolization of GDA and replaced right hepatic artery on 7/25. Off octreotide.  Briefly on pressors, now off.  Remains on IV PPI twice daily. Large maroon BM 7/27 late afternoon, gradual progressive anemia since then, Hb 6.8 on 7/28, transfusing 2 units PRBC, GI following. DD: Recurrent GI bleed versus old blood. As per GI follow-up and communication with them, if has further drop in hemoglobin or develops hemodynamic instability, then considering EGD versus IR reevaluation. Back to n.p.o.  Closely monitor.   Choledocholithiasis, cholangitis:  by CT and MRCP. S/p ERCP, biliary sphincterotomy, balloon extraction of stones 7/25.  Hx of cholecystectomy. LFTs slowly improving, continue to trend. IV ceftriaxone 7/23 >.  [facility] to recommend duration.   Hypotension:  Transient. Briefly on vasopressors, now off. -resolved 7/26  Acute kidney injury: Had normal creatinine on 7/25.  Creatinine peaked to 1.82 on 7/26 with mild hyperkalemia 5.2.  AKI likely related to hypotension/transient hypovolemic/posthemorrhagic shock.  AKI resolved.  Avoid nephrotoxic's and avoid hypotension.  Continue to trend daily BMP.   ITP Chronic: platelets 91 pre GIB. ABLA  -note patient has multiple antibodies  -transfuse for platelets <50k.  Platelets stable in the 90-low 100 range. -Hemoglobin drifted from 9.2-6.8 over the last 24 hours in the context of a large maroon BM on 7/27. -Transfusing 2 units PRBC.  Monitor posttransfusion and CBC thereafter closely and transfuse for hemoglobin 7 g or less.  Essential hypertension: Soft blood pressures.  Discontinued amlodipine for now.  Continue atenolol 12.5 twice daily with holding parameters.  Infrarenal abdominal aortic aneurysm. Larger on CT than previously known now up to 5.3 x 4.8 cm (from 4.8 x 4.4 cm). Left internal iliac aneurysm larger now as well.  -follow up with Dr. Cain post acute  hospitalization     OSA on CPAP -CPAP QHS   Acute urinary retention: DC Foley catheter 7/27 and appears to be voiding.  Body mass index is 26.98 kg/m.  Nutritional Status Nutrition Problem: Inadequate oral intake Etiology: inability to eat Signs/Symptoms: NPO status Interventions: Refer to RD note for recommendations    DVT prophylaxis: SCDs Start: 08/17/21 0038     Code Status: Full Code:  Family Communication: Discussed in detail with patient's spouse at bedside, updated care and answered all questions. Disposition:  Status is: Inpatient Remains inpatient appropriate because: Developed worsening acute blood loss anemia in the context of possible ongoing GI bleeding, requiring transfusions, back to n.p.o., may need further procedures.   Consultants:   PCCM IR, reconsulted on 7/28 for PICC line placement [facility]   Procedures:   Intubation/extubation ERCP 7/25 EGD 7/25 Mesenteric angiogram and coil embolization of GDA and replaced right hepatic artery on 7/25. PICC line placement due to poor IV access and difficult stick for labs.  Antimicrobials:   As noted above   Subjective:  Reports that he has not had a BM since the large maroon BM while in ICU on 7/27.  Passing flatus.  Hungry and has not eaten in 2 weeks.  Denies headache, dizziness, lightheadedness, chest pain or dyspnea.  No pain reported.  Objective:   Vitals:   08/22/21 0231 08/22/21 0646 08/22/21 1023 08/22/21 1310  BP: 97/63 (!) 108/59 109/63 109/66  Pulse: 66 64 (!) 59 64  Resp: 16 19 19 19  Temp: 98.8 F (37.1 C) 98.7 F (37.1 C) 98.4 F (36.9 C) 98 F (36.7 C)  TempSrc: Oral Oral Oral Oral  SpO2: 98% 98% 100% 100%  Weight:      Height:        General exam: Elderly male, moderately built and nourished lying comfortably supine in bed without distress.  Oral mucosa moist.  Pale mucosa. Respiratory system: Clear to auscultation.  No increased work of breathing.  Saturating 100% on room  air. Cardiovascular system: S1 & S2 heard, RRR. No JVD, murmurs, rubs, gallops or clicks. No pedal edema.  Telemetry personally reviewed: Mostly sinus rhythm in the 60s. Gastrointestinal system: Abdomen is nondistended, soft and nontender. No organomegaly or masses felt. Normal bowel sounds heard. GU: Foley catheter discontinued. Central nervous system: Alert and oriented. No focal neurological deficits. Extremities: Symmetric 5 x 5 power. Skin: No rashes, lesions or ulcers Psychiatry: Judgement and insight appear impaired. Mood & affect appropriate.     Data Reviewed:   I have personally reviewed following labs and imaging studies   CBC: Recent Labs  Lab 08/16/21 1833 08/17/21 0548 08/21/21 1804 08/22/21 0045 08/22/21 0632  WBC 7.2   < > 16.1* 13.6* 12.8*  NEUTROABS 5.3  --   --   --   --   HGB 13.3   < > 8.2* 7.5* 6.8*  HCT 40.5   < > 25.5* 22.9* 20.5*  MCV 87.3   < > 93.1 92.0 90.7  PLT 100*   < > 102* 101* 96*   < > = values in this interval not displayed.    Basic Metabolic Panel: Recent Labs  Lab 08/17/21 0548 08/18/21 0534 08/19/21 0521 08/19/21 1551 08/20/21 0612 08/20/21 1836 08/21/21 0330 08/22/21   0045  NA 138   < > 136 137 136 137 140 140  K 3.9   < > 3.4* 2.8* 5.2* 4.3 4.1 3.7  CL 99   < > 101 102 105 107 109 107  CO2 27   < > 24 21* 20* 21* 23 25  GLUCOSE 125*   < > 97 165* 170* 161* 139* 101*  BUN 11   < > 15 17 27* 41* 39* 29*  CREATININE 0.84   < > 0.95 1.01 1.82* 1.79* 1.46* 0.99  CALCIUM 9.3   < > 9.0 8.4* 7.7* 7.8* 7.8* 8.0*  MG 1.9  --  2.0  --  1.9  --   --   --   PHOS  --   --   --   --  5.2*  --   --  2.3*   < > = values in this interval not displayed.    Liver Function Tests: Recent Labs  Lab 08/19/21 1551 08/20/21 0612 08/20/21 1836 08/21/21 0330 08/22/21 0045  AST 223* 140* 116* 82* 55*  ALT 232* 196* 183* 153* 109*  ALKPHOS 270* 223* 165* 146* 117  BILITOT 3.2* 2.9* 1.3* 1.4* 1.1  PROT 6.5 5.5* 5.6* 5.5* 5.5*  ALBUMIN  3.2* 2.7* 2.9* 2.8* 2.9*    CBG: Recent Labs  Lab 08/21/21 2355 08/22/21 0437 08/22/21 0754  GLUCAP 107* 98 93    Microbiology Studies:   Recent Results (from the past 240 hour(s))  MRSA Next Gen by PCR, Nasal     Status: None   Collection Time: 08/19/21  4:41 PM   Specimen: Nasal Mucosa; Nasal Swab  Result Value Ref Range Status   MRSA by PCR Next Gen NOT DETECTED NOT DETECTED Final    Comment: (NOTE) The GeneXpert MRSA Assay (FDA approved for NASAL specimens only), is one component of a comprehensive MRSA colonization surveillance program. It is not intended to diagnose MRSA infection nor to guide or monitor treatment for MRSA infections. Test performance is not FDA approved in patients less than 2 years old. Performed at [facility], [address] [place],  [address]     Radiology Studies:  IR PICC PLACEMENT RIGHT >5 YRS INC IMG GUIDE  Result Date: 08/22/2021 INDICATION: Poor venous access, request for PICC line placement. EXAM: ULTRASOUND AND FLUOROSCOPIC GUIDED PICC LINE INSERTION MEDICATIONS: 1% lidocaine CONTRAST:  None FLUOROSCOPY TIME:  2 mGy COMPLICATIONS: None immediate. TECHNIQUE: The procedure, risks, benefits, and alternatives were explained to the patient and informed written consent was obtained. The right upper extremity was prepped with chlorhexidine in a sterile fashion, and a sterile drape was applied covering the operative field. Maximum barrier sterile technique with sterile gowns and gloves were used for the procedure. A timeout was performed prior to the initiation of the procedure. Local anesthesia was provided with 1% lidocaine. After the overlying soft tissues were anesthetized with 1% lidocaine, a micropuncture kit was utilized to access the right brachial vein. Real-time ultrasound guidance was utilized for vascular access including the acquisition of a permanent ultrasound image documenting patency of the accessed vessel. A  guidewire was advanced to the level of the superior caval-atrial junction for measurement purposes and the PICC line was cut to length. A peel-away sheath was placed and a 39 cm, 5 French, dual lumen was inserted to level of the superior caval-atrial junction. A post procedure spot fluoroscopic was obtained. The catheter easily aspirated and flushed and was secured in place. A dressing   was placed. The patient tolerated the procedure well without immediate post procedural complication. FINDINGS: After catheter placement, the tip lies within the superior cavoatrial junction. The catheter aspirates and flushes normally and is ready for immediate use. IMPRESSION: Successful ultrasound and fluoroscopic guided placement of a right brachial vein approach, 39 cm, 5 French, dual lumen PICC with tip at the superior caval-atrial junction. The PICC line is ready for immediate use. Read by: Aimee Han, PA-C Electronically Signed   By: Glenn  Yamagata M.D.   On: 08/22/2021 12:04   US EKG SITE RITE  Result Date: 08/21/2021 If Site Rite image not attached, placement could not be confirmed due to current cardiac rhythm.   Scheduled Meds:    sodium chloride   Intravenous Once   atenolol  12.5 mg Oral BID   Chlorhexidine Gluconate Cloth  6 each Topical Daily   lidocaine       pantoprazole (PROTONIX) IV  40 mg Intravenous Q12H   sodium chloride flush  3 mL Intravenous Q12H    Continuous Infusions:    sodium chloride     cefTRIAXone (ROCEPHIN)  IV 2 g (08/22/21 1008)   potassium chloride       LOS: 5 days     Anand Hongalgi, MD,  FACP, FHM, SFHM, CMPC (Care Management Physician Certified) [facility] [place]  To contact the attending provider between 7A-7P or the covering provider during after hours 7P-7A, please log into the web site [email or web address] and access using universal [place] password for that web site. If you do not have the password, please call the hospital  operator.  08/22/2021, 1:12 PM   

## 2021-08-22 NOTE — Progress Notes (Signed)
On call MD Jani Gravel was notified of pt Hgb 7.5 per orders.  No new orders were given as MD Maudie Mercury stated that the primary MD [facility] said to transfuse if the Hgb is below 7.

## 2021-08-23 ENCOUNTER — Encounter (HOSPITAL_COMMUNITY): Payer: Self-pay | Admitting: Internal Medicine

## 2021-08-23 ENCOUNTER — Encounter (HOSPITAL_COMMUNITY)
Admission: EM | Disposition: A | Discharge status: Home / Self Care | Payer: Self-pay | Source: Home / Self Care | Attending: Internal Medicine

## 2021-08-23 ENCOUNTER — Inpatient Hospital Stay (HOSPITAL_COMMUNITY): Payer: Medicare Other | Admitting: Certified Registered"

## 2021-08-23 DIAGNOSIS — I2510 Atherosclerotic heart disease of native coronary artery without angina pectoris: Secondary | ICD-10-CM | POA: Diagnosis not present

## 2021-08-23 DIAGNOSIS — D62 Acute posthemorrhagic anemia: Secondary | ICD-10-CM | POA: Diagnosis not present

## 2021-08-23 DIAGNOSIS — K222 Esophageal obstruction: Secondary | ICD-10-CM | POA: Diagnosis not present

## 2021-08-23 DIAGNOSIS — K449 Diaphragmatic hernia without obstruction or gangrene: Secondary | ICD-10-CM | POA: Diagnosis not present

## 2021-08-23 HISTORY — PX: ESOPHAGOGASTRODUODENOSCOPY (EGD) WITH PROPOFOL: SHX5813

## 2021-08-23 LAB — GLUCOSE, CAPILLARY
Glucose-Capillary: 100 mg/dL — ABNORMAL HIGH (ref 70–99)
Glucose-Capillary: 103 mg/dL — ABNORMAL HIGH (ref 70–99)
Glucose-Capillary: 133 mg/dL — ABNORMAL HIGH (ref 70–99)
Glucose-Capillary: 95 mg/dL (ref 70–99)
Glucose-Capillary: 98 mg/dL (ref 70–99)

## 2021-08-23 LAB — BASIC METABOLIC PANEL
Anion gap: 5 (ref 5–15)
BUN: 15 mg/dL (ref 8–23)
CO2: 26 mmol/L (ref 22–32)
Calcium: 8.1 mg/dL — ABNORMAL LOW (ref 8.9–10.3)
Chloride: 106 mmol/L (ref 98–111)
Creatinine, Ser: 0.77 mg/dL (ref 0.61–1.24)
GFR, Estimated: >60 mL/min (ref >60)
Glucose, Bld: 108 mg/dL — ABNORMAL HIGH (ref 70–99)
Potassium: 3.5 mmol/L (ref 3.5–5.1)
Sodium: 137 mmol/L (ref 135–145)

## 2021-08-23 LAB — CBC
HCT: 23.5 % — ABNORMAL LOW (ref 39.0–52.0)
HCT: 23.9 % — ABNORMAL LOW (ref 39.0–52.0)
HCT: 25.9 % — ABNORMAL LOW (ref 39.0–52.0)
Hemoglobin: 7.8 g/dL — ABNORMAL LOW (ref 13.0–17.0)
Hemoglobin: 7.9 g/dL — ABNORMAL LOW (ref 13.0–17.0)
Hemoglobin: 8.6 g/dL — ABNORMAL LOW (ref 13.0–17.0)
MCH: 29.3 pg (ref 26.0–34.0)
MCH: 29.5 pg (ref 26.0–34.0)
MCH: 29.8 pg (ref 26.0–34.0)
MCHC: 32.6 g/dL (ref 30.0–36.0)
MCHC: 33.2 g/dL (ref 30.0–36.0)
MCHC: 33.6 g/dL (ref 30.0–36.0)
MCV: 88.7 fL (ref 80.0–100.0)
MCV: 88.7 fL (ref 80.0–100.0)
MCV: 89.8 fL (ref 80.0–100.0)
Platelets: 107 10*3/uL — ABNORMAL LOW (ref 150–400)
Platelets: 92 10*3/uL — ABNORMAL LOW (ref 150–400)
Platelets: 93 10*3/uL — ABNORMAL LOW (ref 150–400)
RBC: 2.65 MIL/uL — ABNORMAL LOW (ref 4.22–5.81)
RBC: 2.66 MIL/uL — ABNORMAL LOW (ref 4.22–5.81)
RBC: 2.92 MIL/uL — ABNORMAL LOW (ref 4.22–5.81)
RDW: 16.3 % — ABNORMAL HIGH (ref 11.5–15.5)
RDW: 16.7 % — ABNORMAL HIGH (ref 11.5–15.5)
RDW: 16.9 % — ABNORMAL HIGH (ref 11.5–15.5)
WBC: 11 10*3/uL — ABNORMAL HIGH (ref 4.0–10.5)
WBC: 11.5 10*3/uL — ABNORMAL HIGH (ref 4.0–10.5)
WBC: 9.8 10*3/uL (ref 4.0–10.5)
nRBC: 0 % (ref 0.0–0.2)
nRBC: 0 % (ref 0.0–0.2)
nRBC: 0.2 % (ref 0.0–0.2)

## 2021-08-23 LAB — PHOSPHORUS: Phosphorus: 2.7 mg/dL (ref 2.5–4.6)

## 2021-08-23 SURGERY — ESOPHAGOGASTRODUODENOSCOPY (EGD) WITH PROPOFOL
Anesthesia: Monitor Anesthesia Care

## 2021-08-23 MED ORDER — PANTOPRAZOLE SODIUM 40 MG IV SOLR
40.0000 mg | INTRAVENOUS | Status: DC
  Administered 2021-08-23 – 2021-08-25 (×3): 40 mg via INTRAVENOUS
  Filled 2021-08-23 (×3): qty 10

## 2021-08-23 MED ORDER — LACTATED RINGERS IV SOLN: INTRAVENOUS | Status: DC | PRN

## 2021-08-23 MED ORDER — TRIAMCINOLONE ACETONIDE 0.1 % EX CREA: 1.0000 | TOPICAL_CREAM | Freq: Three times a day (TID) | CUTANEOUS | Status: DC | PRN

## 2021-08-23 MED ORDER — ADULT MULTIVITAMIN W/MINERALS CH
1.0000 | ORAL_TABLET | Freq: Every day | ORAL | Status: DC
  Administered 2021-08-23 – 2021-08-25 (×3): 1 via ORAL
  Filled 2021-08-23 (×3): qty 1

## 2021-08-23 MED ORDER — LATANOPROST 0.005 % OP SOLN
1.0000 [drp] | Freq: Every day | OPHTHALMIC | Status: DC
Start: 2021-08-23 — End: 2021-08-25
  Administered 2021-08-23 – 2021-08-24 (×2): 1 [drp] via OPHTHALMIC
  Filled 2021-08-23: qty 2.5

## 2021-08-23 MED ORDER — FLUTICASONE PROPIONATE 50 MCG/ACT NA SUSP
2.0000 | Freq: Every day | NASAL | Status: DC | PRN
Start: 2021-08-23 — End: 2021-08-25

## 2021-08-23 MED ORDER — PROPOFOL 500 MG/50ML IV EMUL
INTRAVENOUS | Status: DC | PRN
  Administered 2021-08-23: 50 ug/kg/min via INTRAVENOUS

## 2021-08-23 MED ORDER — PROPOFOL 10 MG/ML IV BOLUS
INTRAVENOUS | Status: DC | PRN
  Administered 2021-08-23: 20 mg via INTRAVENOUS
  Administered 2021-08-23: 40 mg via INTRAVENOUS
  Administered 2021-08-23: 20 mg via INTRAVENOUS

## 2021-08-23 MED ORDER — ACETAMINOPHEN 500 MG PO TABS: 1000.0000 mg | ORAL_TABLET | Freq: Every day | ORAL | Status: DC | PRN

## 2021-08-23 MED ORDER — POLYETHYLENE GLYCOL 3350 17 G PO PACK: 17.0000 g | PACK | Freq: Every day | ORAL | Status: DC | PRN

## 2021-08-23 MED ORDER — FERROUS SULFATE 325 (65 FE) MG PO TABS
325.0000 mg | ORAL_TABLET | Freq: Every day | ORAL | Status: DC
  Administered 2021-08-23 – 2021-08-25 (×3): 325 mg via ORAL
  Filled 2021-08-23 (×3): qty 1

## 2021-08-23 MED ORDER — PSYLLIUM 95 % PO PACK
1.0000 | PACK | Freq: Every day | ORAL | Status: DC | PRN
Start: 2021-08-23 — End: 2021-08-25
  Filled 2021-08-23: qty 1

## 2021-08-23 MED ORDER — SODIUM CHLORIDE 0.9% FLUSH: 10.0000 mL | Freq: Two times a day (BID) | INTRAVENOUS | Status: DC

## 2021-08-23 SURGICAL SUPPLY — 15 items

## 2021-08-23 NOTE — Progress Notes (Signed)
PROGRESS NOTE   Lonnie Hansen  TKZ:[id number]    DOB: February 03, 1950    DOA: 08/16/2021  PCP: [name], MD   I have briefly reviewed patients previous medical records in [facility].  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  CT abdomen in the ED showed thickening of the CBD wall and increased AAA size.  He underwent MRCP which was concerning for choledocholithiasis.  [facility] was consulted and performed ERCP on 7/25 which was initially uncomplicated, however in the postprocedural setting the patient developed hematemesis and hematochezia, subsequently became diaphoretic and less responsive with ectopy on telemetry.  He was transferred to ICU and intubated.  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  Patient extubated and stable.  Transferred from [facility] to [facility] on 7/27.  Large maroon BM 7/27 late afternoon, gradual progressive anemia since then, Hb 6.8 on 7/28, transfused.  EGD 7/29 without bleeding.   Assessment & Plan:  Principal Problem:   Elevated LFTs Active Problems:   Chronic ITP (idiopathic thrombocytopenia) (HCC)   Hyperlipidemia   History of CAD   Hypertension   Aneurysm of infrarenal abdominal aorta (HCC)   OSA on CPAP   AKI (acute kidney injury) ([place])   Choledocholithiasis   Acute blood loss anemia (ABLA)   Hyperkalemia   Acute cholangitis   Acute gastrointestinal hemorrhage:  occurred in the postprocedural setting  following ERCP, sphincterotomy from site of CBD.  Now s/p coil embolization of GDA and replaced right hepatic artery on 7/25. Off octreotide.  Briefly on pressors, now off.  Remains on IV PPI twice daily. Large maroon BM 7/27 late afternoon, gradual progressive anemia since then, Hb 6.8 on 7/28, s/p 2 units PRBC, GI following. S/p EGD 7/29 without bleeding.  Regular diet recommended.   Choledocholithiasis, cholangitis:  by CT and MRCP. S/p ERCP, biliary sphincterotomy, balloon extraction of stones 7/25.  Hx of cholecystectomy. LFTs slowly improving, continue to trend. IV ceftriaxone 7/23 >.  [facility] to recommend duration.   Hypotension:  Transient. Briefly on vasopressors, now off. -resolved 7/26  Acute kidney injury: Had normal creatinine on 7/25.  Creatinine peaked to 1.82 on 7/26 with mild hyperkalemia 5.2.  AKI likely related to hypotension/transient hypovolemic/posthemorrhagic shock.  AKI resolved.  Avoid nephrotoxic's and avoid hypotension.  Continue to trend daily BMP.   ITP Chronic: platelets 91 pre GIB. ABLA  -note patient has multiple antibodies  -transfuse for platelets <50k.  Platelets stable in the 90-low 100 range. -Hemoglobin drifted from 9.2-6.8 on 7/28 followed by 2 units PRBC transfusion -Hemoglobin stable in the 7.8-7.9 range.  Continue to monitor CBCs closely.  Essential hypertension: Soft blood pressures.  Discontinued amlodipine for now.  Continue atenolol 12.5 twice daily with holding parameters.  Pressures have improved.   Infrarenal abdominal aortic aneurysm. Larger on CT than previously known now up to 5.3 x 4.8 cm (from 4.8 x 4.4 cm). Left internal iliac aneurysm larger now as well.  -follow up with Dr. Donzetta Matters post  acute hospitalization     OSA on CPAP -CPAP QHS   Acute urinary retention: DC Foley catheter 7/27 and appears to be voiding.  Body mass index is 26.98 kg/m.  Nutritional Status Nutrition Problem: Inadequate oral intake Etiology:  inability to eat Signs/Symptoms: NPO status Interventions: Refer to RD note for recommendations    DVT prophylaxis: SCDs Start: 08/17/21 0038     Code Status: Full Code:  Family Communication: None at bedside. Disposition:  Status is: Inpatient Remains inpatient appropriate because: Need to advance diet and monitor for recurrent bleeding and stability of anemia.   Consultants:   PCCM IR, reconsulted on 7/28 for PICC line placement [facility]   Procedures:   Intubation/extubation ERCP 7/25 EGD 7/25 Mesenteric angiogram and coil embolization of GDA and replaced right hepatic artery on 7/25. PICC line placement due to poor IV access and difficult stick for labs. EGD 7/29  Antimicrobials:   As noted above   Subjective:  Seen this morning prior to EGD.  Denied complaints.  No BM since 7/27.  Objective:   Vitals:   08/23/21 1030 08/23/21 1031 08/23/21 1034 08/23/21 1050  BP: 132/64 132/64 127/75 123/71  Pulse: (!) 48 (!) 48 (!) 51 (!) 53  Resp: _0 Temp:    98.2 F (36.8 C)  TempSrc:    Oral  SpO2: 98% 99% 99% 100%  Weight:      Height:        General exam: Elderly male, moderately built and nourished lying comfortably supine in bed without distress.  Oral mucosa moist.  Pale mucosa. Respiratory system: Clear to auscultation.  No increased work of breathing. Cardiovascular system: S1 and S2 heard, RRR.  No JVD, murmurs or pedal edema.  Telemetry personally reviewed: Sinus bradycardia in the 50s. Gastrointestinal system: Abdomen is nondistended, soft and nontender. No organomegaly or masses felt. Normal bowel sounds heard. GU: Foley catheter discontinued. Central nervous system: Alert and oriented. No focal neurological deficits. Extremities: Symmetric 5 x 5 power. Skin: No rashes, lesions or ulcers Psychiatry: Judgement and insight appear impaired. Mood & affect appropriate.     Data Reviewed:   I have personally reviewed following labs and imaging  studies   CBC: Recent Labs  Lab 08/16/21 1833 08/17/21 0548 08/22/21 0632 08/23/21 0015 08/23/21 0600  WBC 7.2   < > 12.8* 11.5* 9.8  NEUTROABS 5.3  --   --   --   --   HGB 13.3   < > 6.8* 7.8* 7.9*  HCT 40.5   < > 20.5* 23.9* 23.5*  MCV 87.3   < > 90.7 89.8 88.7  PLT 100*   < > 96* 92* 93*   < > = values in this interval not displayed.    Basic Metabolic Panel: Recent Labs  Lab 08/17/21 0548 08/18/21 0534 08/19/21 0521 08/19/21 1551 08/20/21 0612 08/20/21 1836 08/21/21 0330 08/22/21 0045 08/23/21 0500  NA 138   < > 136   < > 136 137 140 140 137  K 3.9   < > 3.4*   < > 5.2* 4.3 4.1 3.7 3.5  CL 99   < > 101   < > 105 107 109 107 106  CO2 27   < > 24   < > 20* 21* _1 GLUCOSE 125*   < > 97   < > 170* 161* 139* 101* 108*  BUN 11   < > 15   < > 27* 41*  39* 29* 15  CREATININE 0.84   < > 0.95   < > 1.82* 1.79* 1.46* 0.99 0.77  CALCIUM 9.3   < > 9.0   < > 7.7* 7.8* 7.8* 8.0* 8.1*  MG 1.9  --  2.0  --  1.9  --   --   --   --   PHOS  --   --   --   --  5.2*  --   --  2.3* 2.7   < > = values in this interval not displayed.    Liver Function Tests: Recent Labs  Lab 08/19/21 1551 08/20/21 0612 08/20/21 1836 08/21/21 0330 08/22/21 0045  AST 223* 140* 116* 82* 55*  ALT 232* 196* 183* 153* 109*  ALKPHOS 270* 223* 165* 146* 117  BILITOT 3.2* 2.9* 1.3* 1.4* 1.1  PROT 6.5 5.5* 5.6* 5.5* 5.5*  ALBUMIN 3.2* 2.7* 2.9* 2.8* 2.9*    CBG: Recent Labs  Lab 08/23/21 0435 08/23/21 0739 08/23/21 1120  GLUCAP 98 103* 95    Microbiology Studies:   Recent Results (from the past 240 hour(s))  MRSA Next Gen by PCR, Nasal     Status: None   Collection Time: 08/19/21  4:41 PM   Specimen: Nasal Mucosa; Nasal Swab  Result Value Ref Range Status   MRSA by PCR Next Gen NOT DETECTED NOT DETECTED Final    Comment: (NOTE) The GeneXpert MRSA Assay (FDA approved for NASAL specimens only), is one component of a comprehensive MRSA colonization surveillance program. It is not  intended to diagnose MRSA infection nor to guide or monitor treatment for MRSA infections. Test performance is not FDA approved in patients less than 32 years old. Performed at [facility], [place] [address], [place], [place] [address]     Radiology Studies:  IR PICC PLACEMENT RIGHT >5 YRS INC IMG GUIDE  Result Date: 08/22/2021 INDICATION: Poor venous access, request for PICC line placement. EXAM: ULTRASOUND AND FLUOROSCOPIC GUIDED PICC LINE INSERTION MEDICATIONS: 1% lidocaine CONTRAST:  None FLUOROSCOPY TIME:  2 mGy COMPLICATIONS: None immediate. TECHNIQUE: The procedure, risks, benefits, and alternatives were explained to the patient and informed written consent was obtained. The right upper extremity was prepped with chlorhexidine in a sterile fashion, and a sterile drape was applied covering the operative field. Maximum barrier sterile technique with sterile gowns and gloves were used for the procedure. A timeout was performed prior to the initiation of the procedure. Local anesthesia was provided with 1% lidocaine. After the overlying soft tissues were anesthetized with 1% lidocaine, a micropuncture kit was utilized to access the right brachial vein. Real-time ultrasound guidance was utilized for vascular access including the acquisition of a permanent ultrasound image documenting patency of the accessed vessel. A guidewire was advanced to the level of the superior caval-atrial junction for measurement purposes and the PICC line was cut to length. A peel-away sheath was placed and a 39 cm, 5 [place], dual lumen was inserted to level of the superior caval-atrial junction. A post procedure spot fluoroscopic was obtained. The catheter easily aspirated and flushed and was secured in place. A dressing was placed. The patient tolerated the procedure well without immediate post procedural complication. FINDINGS: After catheter placement, the tip lies within the superior cavoatrial junction.  The catheter aspirates and flushes normally and is ready for immediate use. IMPRESSION: Successful ultrasound and fluoroscopic guided placement of a right brachial vein approach, 39 cm, 5 French, dual lumen PICC with tip at the superior caval-atrial junction.  The PICC line is ready for immediate use. Read by: Durenda Guthrie, PA-C Electronically Signed   By: Aletta Edouard M.D.   On: 08/22/2021 12:04   [place] EKG SITE RITE  Result Date: 08/21/2021 If Site Rite image not attached, placement could not be confirmed due to current cardiac rhythm.   Scheduled Meds:    atenolol  12.5 mg Oral BID   Chlorhexidine Gluconate Cloth  6 each Topical Daily   ferrous sulfate  325 mg Oral Daily   latanoprost  1 drop Both Eyes QHS   multivitamin with minerals  1 tablet Oral Daily   pantoprazole (PROTONIX) IV  40 mg Intravenous Q24H   sodium chloride flush  10-40 mL Intracatheter Q12H   sodium chloride flush  3 mL Intravenous Q12H    Continuous Infusions:    sodium chloride     cefTRIAXone (ROCEPHIN)  IV 2 g (08/23/21 1119)   dextrose 5 % and 0.9% NaCl Stopped (08/22/21 2139)     LOS: 6 days     Vernell Leep, MD,  FACP, [facility], [facility], [facility] (Care Management Physician Certified) [place]  To contact the attending provider between 7A-7P or the covering provider during after hours 7P-7A, please log into the web site [email or web address] and access using universal [place] password for that web site. If you do not have the password, please call the hospital operator.  08/23/2021, 1:33 PM

## 2021-08-23 NOTE — Interval H&P Note (Signed)
History and Physical Interval Note: 70/male with post sphinctertomy bleed requiring IR intervention, melena for an EGD with propofol. 08/23/2021 9:51 AM  Lonnie Hansen  has presented today for EGD, with the diagnosis of anemia.  The various methods of treatment have been discussed with the patient and family. After consideration of risks, benefits and other options for treatment, the patient has consented to  Procedure(s): ESOPHAGOGASTRODUODENOSCOPY (EGD) WITH PROPOFOL (N/A) as a surgical intervention.  The patient's history has been reviewed, patient examined, no change in status, stable for surgery.  I have reviewed the patient's chart and labs.  Questions were answered to the patient's satisfaction.     Ronnette Juniper

## 2021-08-23 NOTE — Anesthesia Postprocedure Evaluation (Signed)
Anesthesia Post Note  Patient: JAREL CUADRA  Procedure(s) Performed: ESOPHAGOGASTRODUODENOSCOPY (EGD) WITH PROPOFOL     Patient location during evaluation: Endoscopy Anesthesia Type: MAC Level of consciousness: awake and alert Pain management: pain level controlled Vital Signs Assessment: post-procedure vital signs reviewed and stable Respiratory status: spontaneous breathing, nonlabored ventilation and respiratory function stable Cardiovascular status: blood pressure returned to baseline and stable Postop Assessment: no apparent nausea or vomiting Anesthetic complications: no   No notable events documented.  Last Vitals:  Vitals:   08/23/21 1034 08/23/21 1050  BP: 127/75 123/71  Pulse: (!) 51 (!) 53  Resp: 16 16  Temp:  36.8 C  SpO2: 99% 100%    Last Pain:  Vitals:   08/23/21 1050  TempSrc: Oral  PainSc:                  Lidia Collum

## 2021-08-23 NOTE — Progress Notes (Signed)
Pt has been refusing cpap QHS.

## 2021-08-23 NOTE — Transfer of Care (Signed)
Immediate Anesthesia Transfer of Care Note  Patient: Lonnie Hansen  Procedure(s) Performed: ESOPHAGOGASTRODUODENOSCOPY (EGD) WITH PROPOFOL  Patient Location: PACU  Anesthesia Type:MAC  Level of Consciousness: awake, alert , oriented and patient cooperative  Airway & Oxygen Therapy: Patient Spontanous Breathing and Patient connected to face mask oxygen  Post-op Assessment: Report given to RN and Post -op Vital signs reviewed and stable  Post vital signs: Reviewed and stable  Last Vitals:  Vitals Value Taken Time  BP 141/85 08/23/21 1015  Temp 36.7 C 08/23/21 1015  Pulse 51 08/23/21 1015  Resp 18 08/23/21 1015  SpO2 100 % 08/23/21 1015  Vitals shown include unvalidated device data.  Last Pain:  Vitals:   08/23/21 1015  TempSrc: Oral  PainSc: 0-No pain      Patients Stated Pain Goal: 0 (02/03/30 3557)  Complications: No notable events documented.

## 2021-08-23 NOTE — Op Note (Signed)
[facility] Patient Name: Lonnie Hansen Procedure Date: 08/23/2021 MRN: [id number] Attending MD: Ronnette Juniper , MD Date of Birth: 20-Aug-1950 CSN: [id number] Age: 71 Admit Type: Inpatient Procedure:                Upper GI endoscopy Indications:              Acute post hemorrhagic anemia, Melena, post                            sphincterotomy bleeding Providers:                Ronnette Juniper, MD, Elmer Ramp. Tilden Dome, RN, Despina Pole, Technician, Cleda Daub, CRNA Referring MD:             [facility] Medicines:                Monitored Anesthesia Care Complications:            No immediate complications. Estimated Blood Loss:     Estimated blood loss: none. Procedure:                Pre-Anesthesia Assessment:                           - Prior to the procedure, a History and Physical                            was performed, and patient medications and                            allergies were reviewed. The patient's tolerance of                            previous anesthesia was also reviewed. The risks                            and benefits of the procedure and the sedation                            options and risks were discussed with the patient.                            All questions were answered, and informed consent                            was obtained. Prior Anticoagulants: The patient has                            taken no previous anticoagulant or antiplatelet                            agents. ASA Grade Assessment: IV - A patient with  severe systemic disease that is a constant threat                            to life. After reviewing the risks and benefits,                            the patient was deemed in satisfactory condition to                            undergo the procedure.                           After obtaining informed consent, the endoscope was                            passed  under direct vision. Throughout the                            procedure, the patient's blood pressure, pulse, and                            oxygen saturations were monitored continuously. The                            [id number]-[id number] ([id number]) Olympus endoscope was introduced                            through the mouth, and advanced to the second part                            of duodenum. The upper GI endoscopy was                            accomplished without difficulty. The patient                            tolerated the procedure well. Scope In: Scope Out: Findings:      The examined esophagus was normal.      A widely patent Schatzki ring was found at the gastroesophageal junction.      A 3 cm hiatal hernia was present.      The entire examined stomach was normal.      The cardia and gastric fundus were normal on retroflexion.      The ampulla and examined duodenum were normal. Impression:               - Normal esophagus.                           - Widely patent Schatzki ring.                           - 3 cm hiatal hernia.                           - Normal stomach.                           -  Normal ampulla and examined duodenum.                           - No specimens collected. Moderate Sedation:      Patient did not receive moderate sedation for this procedure, but       instead received monitored anesthesia care. Recommendation:           - Resume regular diet. Procedure Code(s):        --- Professional ---                           [phone], Esophagogastroduodenoscopy, flexible,                            transoral; diagnostic, including collection of                            specimen(s) by brushing or washing, when performed                            (separate procedure) Diagnosis Code(s):        --- Professional ---                           K22.2, Esophageal obstruction                           K44.9, Diaphragmatic hernia without obstruction or                             gangrene                           D62, Acute posthemorrhagic anemia                           K92.1, Melena (includes Hematochezia) CPT copyright 2019 American Medical Association. All rights reserved. The codes documented in this report are preliminary and upon coder review may  be revised to meet current compliance requirements. Ronnette Juniper, MD 08/23/2021 10:11:07 AM This report has been signed electronically. Number of Addenda: 0

## 2021-08-23 NOTE — Progress Notes (Signed)
Pt with large black tarry stool after dinner. Hbg drawn an hour before was up to 8.6 from 7.9 earlier in the day.

## 2021-08-23 NOTE — Anesthesia Preprocedure Evaluation (Signed)
Anesthesia Evaluation  Patient identified by MRN, date of birth, ID band Patient awake    Reviewed: Allergy & Precautions, NPO status , Patient's Chart, lab work & pertinent test results, reviewed documented beta blocker date and time   Airway Mallampati: II  TM Distance: >3 FB Neck ROM: Full    Dental  (+) Missing, Poor Dentition,    Pulmonary sleep apnea and Continuous Positive Airway Pressure Ventilation , former smoker,    Pulmonary exam normal        Cardiovascular hypertension, Pt. on medications and Pt. 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 Had further drop in Hgb concerning for recurrent bleeding.  Reproductive/Obstetrics                             Anesthesia Physical Anesthesia Plan  ASA: 3 and emergent  Anesthesia Plan: MAC   Post-op Pain Management: Minimal or no pain anticipated   Induction: Intravenous  PONV Risk Score and Plan: 1 and Propofol infusion, TIVA and Treatment may vary due to age or medical condition  Airway Management Planned: Natural Airway, Nasal Cannula and Simple Face Mask  Additional Equipment: None  Intra-op Plan:   Post-operative Plan:   Informed Consent: I have reviewed the patients History and Physical, chart, labs and  discussed the procedure including the risks, benefits and alternatives for the proposed anesthesia with the patient or authorized representative who has indicated his/her understanding and acceptance.       Plan Discussed with:   Anesthesia Plan Comments:         Anesthesia Quick Evaluation

## 2021-08-24 LAB — CBC
HCT: 24.4 % — ABNORMAL LOW (ref 39.0–52.0)
Hemoglobin: 8 g/dL — ABNORMAL LOW (ref 13.0–17.0)
MCH: 29.6 pg (ref 26.0–34.0)
MCHC: 32.8 g/dL (ref 30.0–36.0)
MCV: 90.4 fL (ref 80.0–100.0)
Platelets: 100 10*3/uL — ABNORMAL LOW (ref 150–400)
RBC: 2.7 MIL/uL — ABNORMAL LOW (ref 4.22–5.81)
RDW: 16 % — ABNORMAL HIGH (ref 11.5–15.5)
WBC: 9.6 10*3/uL (ref 4.0–10.5)
nRBC: 0 % (ref 0.0–0.2)

## 2021-08-24 LAB — COMPREHENSIVE METABOLIC PANEL
ALT: 65 U/L — ABNORMAL HIGH (ref 0–44)
AST: 31 U/L (ref 15–41)
Albumin: 2.8 g/dL — ABNORMAL LOW (ref 3.5–5.0)
Alkaline Phosphatase: 84 U/L (ref 38–126)
Anion gap: 6 (ref 5–15)
BUN: 12 mg/dL (ref 8–23)
CO2: 25 mmol/L (ref 22–32)
Calcium: 8.2 mg/dL — ABNORMAL LOW (ref 8.9–10.3)
Chloride: 105 mmol/L (ref 98–111)
Creatinine, Ser: 0.72 mg/dL (ref 0.61–1.24)
GFR, Estimated: >60 mL/min (ref >60)
Glucose, Bld: 105 mg/dL — ABNORMAL HIGH (ref 70–99)
Potassium: 3.5 mmol/L (ref 3.5–5.1)
Sodium: 136 mmol/L (ref 135–145)
Total Bilirubin: 0.9 mg/dL (ref 0.3–1.2)
Total Protein: 5.4 g/dL — ABNORMAL LOW (ref 6.5–8.1)

## 2021-08-24 LAB — MAGNESIUM: Magnesium: 1.8 mg/dL (ref 1.7–2.4)

## 2021-08-24 LAB — PHOSPHORUS: Phosphorus: 2.7 mg/dL (ref 2.5–4.6)

## 2021-08-24 NOTE — Progress Notes (Signed)
Subjective: Patient reports having licorice like liquid black stool yesterday evening. He has been tolerating regular diet. No bowel movements today morning.  Objective: Vital signs in last 24 hours: Temp:  [98.2 F (36.8 C)-100 F (37.8 C)] 99.1 F (37.3 C) (07/30 0759) Pulse Rate:  [50-61] 50 (07/30 0450) Resp:  [18-20] 18 (07/30 0759) BP: (118-133)/(68-81) 133/81 (07/30 0759) SpO2:  [99 %-100 %] 99 % (07/30 0450) Weight change:  Last BM Date : 08/23/21  PE: Appears comfortable GENERAL: On room air, able to speak in full sentences, not in distress, mild pallor  ABDOMEN: Soft, nondistended, normoactive bowel sounds EXTREMITIES: No deformity  Lab Results: Results for orders placed or performed during the hospital encounter of 08/16/21 (from the past 48 hour(s))  Glucose, capillary     Status: None   Collection Time: 08/22/21  1:07 PM  Result Value Ref Range   Glucose-Capillary 97 70 - 99 mg/dL    Comment: Glucose reference range applies only to samples taken after fasting for at least 8 hours.  Glucose, capillary     Status: None   Collection Time: 08/22/21  4:48 PM  Result Value Ref Range   Glucose-Capillary 87 70 - 99 mg/dL    Comment: Glucose reference range applies only to samples taken after fasting for at least 8 hours.  Glucose, capillary     Status: None   Collection Time: 08/22/21  8:03 PM  Result Value Ref Range   Glucose-Capillary 81 70 - 99 mg/dL    Comment: Glucose reference range applies only to samples taken after fasting for at least 8 hours.  Glucose, capillary     Status: Abnormal   Collection Time: 08/23/21 12:12 AM  Result Value Ref Range   Glucose-Capillary 100 (H) 70 - 99 mg/dL    Comment: Glucose reference range applies only to samples taken after fasting for at least 8 hours.  CBC     Status: Abnormal   Collection Time: 08/23/21 12:15 AM  Result Value Ref Range   WBC 11.5 (H) 4.0 - 10.5 K/uL   RBC 2.66 (L) 4.22 - 5.81 MIL/uL   Hemoglobin 7.8  (L) 13.0 - 17.0 g/dL   HCT 23.9 (L) 39.0 - 52.0 %   MCV 89.8 80.0 - 100.0 fL   MCH 29.3 26.0 - 34.0 pg   MCHC 32.6 30.0 - 36.0 g/dL   RDW 16.9 (H) 11.5 - 15.5 %   Platelets 92 (L) 150 - 400 K/uL    Comment: SPECIMEN CHECKED FOR CLOTS Immature Platelet Fraction may be clinically indicated, consider ordering this additional test [id number] CONSISTENT WITH PREVIOUS RESULT REPEATED TO VERIFY    nRBC 0.2 0.0 - 0.2 %    Comment: Performed at [facility], [place] [address], [place], [place] [address]  Glucose, capillary     Status: None   Collection Time: 08/23/21  4:35 AM  Result Value Ref Range   Glucose-Capillary 98 70 - 99 mg/dL    Comment: Glucose reference range applies only to samples taken after fasting for at least 8 hours.  Basic metabolic panel     Status: Abnormal   Collection Time: 08/23/21  5:00 AM  Result Value Ref Range   Sodium 137 135 - 145 mmol/L   Potassium 3.5 3.5 - 5.1 mmol/L   Chloride 106 98 - 111 mmol/L   CO2 26 22 - 32 mmol/L   Glucose, Bld 108 (H) 70 - 99 mg/dL    Comment: Glucose reference range applies  only to samples taken after fasting for at least 8 hours.   BUN 15 8 - 23 mg/dL   Creatinine, Ser 0.77 0.61 - 1.24 mg/dL   Calcium 8.1 (L) 8.9 - 10.3 mg/dL   GFR, Estimated >60 >60 mL/min    Comment: (NOTE) Calculated using the CKD-EPI Creatinine Equation (2021)    Anion gap 5 5 - 15    Comment: Performed at [facility], [place] [address], [place], [place] [address]  Phosphorus     Status: None   Collection Time: 08/23/21  5:00 AM  Result Value Ref Range   Phosphorus 2.7 2.5 - 4.6 mg/dL    Comment: Performed at [facility], [place] [address], [place], [place] [address]  CBC     Status: Abnormal   Collection Time: 08/23/21  6:00 AM  Result Value Ref Range   WBC 9.8 4.0 - 10.5 K/uL   RBC 2.65 (L) 4.22 - 5.81 MIL/uL   Hemoglobin 7.9 (L) 13.0 - 17.0 g/dL   HCT 23.5 (L) 39.0 - 52.0 %   MCV 88.7 80.0 -  100.0 fL   MCH 29.8 26.0 - 34.0 pg   MCHC 33.6 30.0 - 36.0 g/dL   RDW 16.7 (H) 11.5 - 15.5 %   Platelets 93 (L) 150 - 400 K/uL    Comment: Immature Platelet Fraction may be clinically indicated, consider ordering this additional test [id number] CONSISTENT WITH PREVIOUS RESULT    nRBC 0.0 0.0 - 0.2 %    Comment: Performed at [facility], [place] [address] [place], [place] [address]  Glucose, capillary     Status: Abnormal   Collection Time: 08/23/21  7:39 AM  Result Value Ref Range   Glucose-Capillary 103 (H) 70 - 99 mg/dL    Comment: Glucose reference range applies only to samples taken after fasting for at least 8 hours.  Glucose, capillary     Status: None   Collection Time: 08/23/21 11:20 AM  Result Value Ref Range   Glucose-Capillary 95 70 - 99 mg/dL    Comment: Glucose reference range applies only to samples taken after fasting for at least 8 hours.  Glucose, capillary     Status: Abnormal   Collection Time: 08/23/21  4:33 PM  Result Value Ref Range   Glucose-Capillary 133 (H) 70 - 99 mg/dL    Comment: Glucose reference range applies only to samples taken after fasting for at least 8 hours.  CBC     Status: Abnormal   Collection Time: 08/23/21  6:35 PM  Result Value Ref Range   WBC 11.0 (H) 4.0 - 10.5 K/uL   RBC 2.92 (L) 4.22 - 5.81 MIL/uL   Hemoglobin 8.6 (L) 13.0 - 17.0 g/dL   HCT 25.9 (L) 39.0 - 52.0 %   MCV 88.7 80.0 - 100.0 fL   MCH 29.5 26.0 - 34.0 pg   MCHC 33.2 30.0 - 36.0 g/dL   RDW 16.3 (H) 11.5 - 15.5 %   Platelets 107 (L) 150 - 400 K/uL   nRBC 0.0 0.0 - 0.2 %    Comment: Performed at [facility], [place] [address] [place], [place] [address]  CBC     Status: Abnormal   Collection Time: 08/24/21  3:50 AM  Result Value Ref Range   WBC 9.6 4.0 - 10.5 K/uL   RBC 2.70 (L) 4.22 - 5.81 MIL/uL   Hemoglobin 8.0 (L) 13.0 - 17.0 g/dL   HCT 24.4 (L) 39.0 -  52.0 %   MCV 90.4 80.0 - 100.0 fL   MCH 29.6 26.0 - 34.0 pg   MCHC 32.8  30.0 - 36.0 g/dL   RDW 16.0 (H) 11.5 - 15.5 %   Platelets 100 (L) 150 - 400 K/uL    Comment: Immature Platelet Fraction may be clinically indicated, consider ordering this additional test [id number]    nRBC 0.0 0.0 - 0.2 %    Comment: Performed at [facility], [place] [address], [place], [place] [address]  Comprehensive metabolic panel     Status: Abnormal   Collection Time: 08/24/21  3:50 AM  Result Value Ref Range   Sodium 136 135 - 145 mmol/L   Potassium 3.5 3.5 - 5.1 mmol/L   Chloride 105 98 - 111 mmol/L   CO2 25 22 - 32 mmol/L   Glucose, Bld 105 (H) 70 - 99 mg/dL    Comment: Glucose reference range applies only to samples taken after fasting for at least 8 hours.   BUN 12 8 - 23 mg/dL   Creatinine, Ser 0.72 0.61 - 1.24 mg/dL   Calcium 8.2 (L) 8.9 - 10.3 mg/dL   Total Protein 5.4 (L) 6.5 - 8.1 g/dL   Albumin 2.8 (L) 3.5 - 5.0 g/dL   AST 31 15 - 41 U/L   ALT 65 (H) 0 - 44 U/L   Alkaline Phosphatase 84 38 - 126 U/L   Total Bilirubin 0.9 0.3 - 1.2 mg/dL   GFR, Estimated >60 >60 mL/min    Comment: (NOTE) Calculated using the CKD-EPI Creatinine Equation (2021)    Anion gap 6 5 - 15    Comment: Performed at [facility], [place] [address] [place] [address], [place], [place] [address]  Phosphorus     Status: None   Collection Time: 08/24/21  3:50 AM  Result Value Ref Range   Phosphorus 2.7 2.5 - 4.6 mg/dL    Comment: Performed at [facility], [place] [address], [place], [place] [address]  Magnesium     Status: None   Collection Time: 08/24/21  3:50 AM  Result Value Ref Range   Magnesium 1.8 1.7 - 2.4 mg/dL    Comment: Performed at [facility], [place] [address], [place],  [address]    Studies/Results: IR PICC PLACEMENT RIGHT >5 YRS INC IMG GUIDE  Result Date: 08/22/2021 INDICATION: Poor venous access, request for PICC line placement. EXAM: ULTRASOUND AND FLUOROSCOPIC GUIDED PICC LINE INSERTION MEDICATIONS: 1%  lidocaine CONTRAST:  None FLUOROSCOPY TIME:  2 mGy COMPLICATIONS: None immediate. TECHNIQUE: The procedure, risks, benefits, and alternatives were explained to the patient and informed written consent was obtained. The right upper extremity was prepped with chlorhexidine in a sterile fashion, and a sterile drape was applied covering the operative field. Maximum barrier sterile technique with sterile gowns and gloves were used for the procedure. A timeout was performed prior to the initiation of the procedure. Local anesthesia was provided with 1% lidocaine. After the overlying soft tissues were anesthetized with 1% lidocaine, a micropuncture kit was utilized to access the right brachial vein. Real-time ultrasound guidance was utilized for vascular access including the acquisition of a permanent ultrasound image documenting patency of the accessed vessel. A guidewire was advanced to the level of the superior caval-atrial junction for measurement purposes and the PICC line was cut to length. A peel-away sheath was placed and a 39 cm, 5 [place], dual lumen was inserted to level of the superior caval-atrial junction. A post  procedure spot fluoroscopic was obtained. The catheter easily aspirated and flushed and was secured in place. A dressing was placed. The patient tolerated the procedure well without immediate post procedural complication. FINDINGS: After catheter placement, the tip lies within the superior cavoatrial junction. The catheter aspirates and flushes normally and is ready for immediate use. IMPRESSION: Successful ultrasound and fluoroscopic guided placement of a right brachial vein approach, 39 cm, 5 French, dual lumen PICC with tip at the superior caval-atrial junction. The PICC line is ready for immediate use. Read by: Durenda Guthrie, PA-C Electronically Signed   By: Aletta Edouard M.D.   On: 08/22/2021 12:04    Medications: I have reviewed the patient's current medications.  Assessment: Elevated LFTs,  CBD stone, status post ERCP, sphincterotomy and stone removal T. bili 0.9/AST 31/ALT 65/ALP 84  Post sphincterotomy bleeding requiring IR embolization EGD yesterday did not show evidence of active bleeding Hemoglobin 8.6 yesterday and 8 today?  Hemodilutional, BUN normal Hemodynamically stable  Plan: Tolerating regular diet Continue PPI 40 mg once a day at least for 1 month Okay to DC home from GI standpoint GI will sign off  Ronnette Juniper, MD 08/24/2021, 11:12 AM

## 2021-08-24 NOTE — Progress Notes (Signed)
Pt has been refusing cpap.

## 2021-08-24 NOTE — Progress Notes (Signed)
PROGRESS NOTE   Lonnie Hansen  GBT:[id number]    DOB: [birth year]    DOA: 08/16/2021  PCP: [name], MD   I have briefly reviewed patients previous medical records in [facility].  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  CT abdomen in the ED showed thickening of the CBD wall and increased AAA size.  He underwent MRCP which was concerning for choledocholithiasis.  [facility] was consulted and performed ERCP on 7/25 which was initially uncomplicated, however in the postprocedural setting the patient developed hematemesis and hematochezia, subsequently became diaphoretic and less responsive with ectopy on telemetry.  He was transferred to ICU and intubated.  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  Patient extubated and stable.  Transferred from PCCM to [facility] on 7/27.  Large maroon BM 7/27 late afternoon, gradual progressive anemia since then, Hb 6.8 on 7/28, transfused.  EGD 7/29 without bleeding.   Assessment & Plan:  Principal Problem:   Elevated LFTs Active Problems:   Chronic ITP (idiopathic thrombocytopenia) (HCC)   Hyperlipidemia   History of CAD   Hypertension   Aneurysm of infrarenal abdominal aorta (HCC)   OSA on CPAP   AKI (acute kidney injury) ([place])   Choledocholithiasis   Acute blood loss anemia (ABLA)   Hyperkalemia   Acute cholangitis   Acute gastrointestinal hemorrhage:  occurred in the postprocedural setting  following ERCP, sphincterotomy from site of CBD.  Now s/p coil embolization of GDA and replaced right hepatic artery on 7/25. Off octreotide.  Briefly on pressors, now off.  Remains on IV PPI twice daily. Large maroon BM 7/27 late afternoon, gradual progressive anemia since then, Hb 6.8 on 7/28, s/p 2 units PRBC, GI following. S/p EGD 7/29 without bleeding.  Regular diet recommended. Remained stable without any further GI bleeding and hemoglobin remained stable.   Choledocholithiasis, cholangitis:  by CT and MRCP. S/p ERCP, biliary sphincterotomy, balloon extraction of stones 7/25.  Hx of cholecystectomy. LFTs slowly improving, continue to trend. IV ceftriaxone 7/23 >.  [facility] to recommend duration. Unfortunately GI have signed off without recommending duration of antibiotics, will follow-up with them tomorrow prior to discharge.   Hypotension:  Transient. Briefly on vasopressors, now off. -resolved 7/26  Acute kidney injury: Had normal creatinine on 7/25.  Creatinine peaked to 1.82 on 7/26 with mild hyperkalemia 5.2.  AKI likely related to hypotension/transient hypovolemic/posthemorrhagic shock.  AKI resolved.  Avoid nephrotoxic's and avoid hypotension.  Continue to trend daily BMP.   ITP Chronic: platelets 91 pre GIB. ABLA  -note patient has multiple antibodies  -transfuse for platelets <50k.  Platelets stable in the 90-low 100 range. -Hemoglobin drifted from 9.2-6.8 on 7/28 followed by 2 units PRBC transfusion -Hemoglobin now up to 8 g per DL.  Stable.  Essential hypertension: Soft blood pressures.  Discontinued amlodipine for now.  Continue atenolol 12.5 twice daily with holding parameters.  Pressures have improved.   Infrarenal abdominal aortic aneurysm. Larger on CT than previously known  now up to 5.3 x 4.8 cm (from 4.8 x 4.4 cm). Left internal iliac aneurysm larger now as well.  -follow up with Dr. Donzetta Matters post acute hospitalization     OSA on CPAP -CPAP QHS   Acute urinary  retention: DC Foley catheter 7/27 and appears to be voiding.  Body mass index is 26.98 kg/m.  Nutritional Status Nutrition Problem: Inadequate oral intake Etiology: inability to eat Signs/Symptoms: NPO status Interventions: Refer to RD note for recommendations    DVT prophylaxis: SCDs Start: 08/17/21 0038     Code Status: Full Code:  Family Communication: None at bedside. Disposition:  Status is: Inpatient Likely DC home 7/31.   Consultants:   PCCM IR, reconsulted on 7/28 for PICC line placement [facility]   Procedures:   Intubation/extubation ERCP 7/25 EGD 7/25 Mesenteric angiogram and coil embolization of GDA and replaced right hepatic artery on 7/25. PICC line placement due to poor IV access and difficult stick for labs. EGD 7/29  Antimicrobials:   As noted above   Subjective:  Denies complaints.  States that he had a very large dark BM last night-likely old blood.  Objective:   Vitals:   08/24/21 0759 08/24/21 1153 08/24/21 1500 08/24/21 1635  BP: 133/81 121/77  121/69  Pulse:  (!) 57 60 (!) 55  Resp: '18 18  18  '$ Temp: 99.1 F (37.3 C) 99 F (37.2 C)  99.1 F (37.3 C)  TempSrc: Oral Oral  Oral  SpO2:  99%    Weight:      Height:        General exam: Elderly male, moderately built and nourished lying comfortably supine in bed without distress.  Oral mucosa moist.  Pale mucosa. Respiratory system: Clear to auscultation.  No increased work of breathing. Cardiovascular system: S1 and S2 heard, RRR.  No JVD, murmurs or pedal edema.  Telemetry personally reviewed: Sinus bradycardia in the 50s Gastrointestinal system: Abdomen is nondistended, soft and nontender. No organomegaly or masses felt. Normal bowel sounds heard. GU: Foley catheter discontinued. Central nervous system: Alert and oriented. No focal neurological deficits. Extremities: Symmetric 5 x 5 power. Skin: No rashes, lesions or ulcers Psychiatry: Judgement and insight appear impaired. Mood &  affect appropriate.     Data Reviewed:   I have personally reviewed following labs and imaging studies   CBC: Recent Labs  Lab 08/23/21 0600 08/23/21 1835 08/24/21 0350  WBC 9.8 11.0* 9.6  HGB 7.9* 8.6* 8.0*  HCT 23.5* 25.9* 24.4*  MCV 88.7 88.7 90.4  PLT 93* 107* 100*    Basic Metabolic Panel: Recent Labs  Lab 08/19/21 0521 08/19/21 1551 08/20/21 0612 08/20/21 1836 08/21/21 0330 08/22/21 0045 08/23/21 0500 08/24/21 0350  NA 136   < > 136 137 140 140 137 136  K 3.4*   < > 5.2* 4.3 4.1 3.7 3.5 3.5  CL 101   < > 105 107 109 107 106 105  CO2 24   < > 20* 21* '23 25 26 25  '$ GLUCOSE 97   < > 170* 161* 139* 101* 108* 105*  BUN 15   < > 27* 41* 39* 29* 15 12  CREATININE 0.95   < > 1.82* 1.79* 1.46* 0.99 0.77 0.72  CALCIUM 9.0   < > 7.7* 7.8* 7.8* 8.0* 8.1* 8.2*  MG 2.0  --  1.9  --   --   --   --  1.8  PHOS  --   --  5.2*  --   --  2.3* 2.7 2.7   < > = values in this interval not displayed.    Liver Function Tests: Recent Labs  Lab 08/20/21 0612 08/20/21 1836 08/21/21 0330 08/22/21 0045 08/24/21 0350  AST 140* 116* 82* 55* 31  ALT 196* 183* 153* 109* 65*  ALKPHOS 223* 165* 146* 117 84  BILITOT 2.9* 1.3* 1.4* 1.1 0.9  PROT 5.5* 5.6* 5.5* 5.5* 5.4*  ALBUMIN 2.7* 2.9* 2.8* 2.9* 2.8*    CBG: Recent Labs  Lab 08/23/21 0739 08/23/21 1120 08/23/21 1633  GLUCAP 103* 95 133*    Microbiology Studies:   Recent Results (from the past 240 hour(s))  MRSA Next Gen by PCR, Nasal     Status: None   Collection Time: 08/19/21  4:41 PM   Specimen: Nasal Mucosa; Nasal Swab  Result Value Ref Range Status   MRSA by PCR Next Gen NOT DETECTED NOT DETECTED Final    Comment: (NOTE) The GeneXpert MRSA Assay (FDA approved for NASAL specimens only), is one component of a comprehensive MRSA colonization surveillance program. It is not intended to diagnose MRSA infection nor to guide or monitor treatment for MRSA infections. Test performance is not FDA approved in patients  less than 82 years old. Performed at [facility], [place] [address], [place], [place] [address]     Radiology Studies:  No results found.  Scheduled Meds:    atenolol  12.5 mg Oral BID   Chlorhexidine Gluconate Cloth  6 each Topical Daily   ferrous sulfate  325 mg Oral Daily   latanoprost  1 drop Both Eyes QHS   multivitamin with minerals  1 tablet Oral Daily   pantoprazole (PROTONIX) IV  40 mg Intravenous Q24H   sodium chloride flush  10-40 mL Intracatheter Q12H   sodium chloride flush  3 mL Intravenous Q12H    Continuous Infusions:    sodium chloride     cefTRIAXone (ROCEPHIN)  IV 2 g (08/24/21 1103)     LOS: 7 days     Vernell Leep, MD,  FACP, [facility], [facility], [facility] (Care Management Physician Certified) [place]  To contact the attending provider between 7A-7P or the covering provider during after hours 7P-7A, please log into the web site [email or web address] and access using universal [place] password for that web site. If you do not have the password, please call the hospital operator.  08/24/2021, 6:12 PM

## 2021-08-25 ENCOUNTER — Encounter (HOSPITAL_COMMUNITY): Payer: Self-pay | Admitting: Gastroenterology

## 2021-08-25 DIAGNOSIS — N179 Acute kidney failure, unspecified: Secondary | ICD-10-CM

## 2021-08-25 LAB — TYPE AND SCREEN
ABO/RH(D): AB POS
Antibody Screen: NEGATIVE
Unit division: 0
Unit division: 0
Unit division: 0
Unit division: 0
Unit division: 0
Unit division: 0
Unit division: 0
Unit division: 0
Unit division: 0
Unit division: 0
Unit division: 0
Unit division: 0

## 2021-08-25 LAB — BPAM RBC
Blood Product Expiration Date: 202308112359
Blood Product Expiration Date: 202308122359
Blood Product Expiration Date: 202308132359
Blood Product Expiration Date: 202308142359
Blood Product Expiration Date: 202308142359
Blood Product Expiration Date: 202308142359
Blood Product Expiration Date: 202308142359
Blood Product Expiration Date: 202308142359
Blood Product Expiration Date: 202308142359
Blood Product Expiration Date: 202308142359
Blood Product Expiration Date: 202308142359
Blood Product Expiration Date: 202308142359
ISSUE DATE / TIME: 202307251811
ISSUE DATE / TIME: 202307252011
ISSUE DATE / TIME: 202307252117
ISSUE DATE / TIME: 202307252117
ISSUE DATE / TIME: 202307271922
ISSUE DATE / TIME: 202307271922
ISSUE DATE / TIME: 202307271922
ISSUE DATE / TIME: 202307281351
ISSUE DATE / TIME: 202307281721
ISSUE DATE / TIME: 202307290559
Unit Type and Rh: 6200
Unit Type and Rh: 6200
Unit Type and Rh: 6200
Unit Type and Rh: 6200
Unit Type and Rh: 6200
Unit Type and Rh: 6200
Unit Type and Rh: 6200
Unit Type and Rh: 6200
Unit Type and Rh: 6200
Unit Type and Rh: 6200
Unit Type and Rh: 6200
Unit Type and Rh: 6200

## 2021-08-25 LAB — COMPREHENSIVE METABOLIC PANEL
ALT: 54 U/L — ABNORMAL HIGH (ref 0–44)
AST: 26 U/L (ref 15–41)
Albumin: 2.8 g/dL — ABNORMAL LOW (ref 3.5–5.0)
Alkaline Phosphatase: 80 U/L (ref 38–126)
Anion gap: 7 (ref 5–15)
BUN: 12 mg/dL (ref 8–23)
CO2: 25 mmol/L (ref 22–32)
Calcium: 8.2 mg/dL — ABNORMAL LOW (ref 8.9–10.3)
Chloride: 105 mmol/L (ref 98–111)
Creatinine, Ser: 0.72 mg/dL (ref 0.61–1.24)
GFR, Estimated: >60 mL/min (ref >60)
Glucose, Bld: 104 mg/dL — ABNORMAL HIGH (ref 70–99)
Potassium: 3.5 mmol/L (ref 3.5–5.1)
Sodium: 137 mmol/L (ref 135–145)
Total Bilirubin: 0.7 mg/dL (ref 0.3–1.2)
Total Protein: 5.6 g/dL — ABNORMAL LOW (ref 6.5–8.1)

## 2021-08-25 LAB — CBC
HCT: 25.6 % — ABNORMAL LOW (ref 39.0–52.0)
Hemoglobin: 8.3 g/dL — ABNORMAL LOW (ref 13.0–17.0)
MCH: 29.6 pg (ref 26.0–34.0)
MCHC: 32.4 g/dL (ref 30.0–36.0)
MCV: 91.4 fL (ref 80.0–100.0)
Platelets: 105 10*3/uL — ABNORMAL LOW (ref 150–400)
RBC: 2.8 MIL/uL — ABNORMAL LOW (ref 4.22–5.81)
RDW: 16 % — ABNORMAL HIGH (ref 11.5–15.5)
WBC: 8.3 10*3/uL (ref 4.0–10.5)
nRBC: 0 % (ref 0.0–0.2)

## 2021-08-25 MED ORDER — PSYLLIUM 95 % PO PACK: 1.0000 | PACK | Freq: Every day | ORAL | Status: DC | PRN

## 2021-08-25 MED ORDER — ACETAMINOPHEN 500 MG PO TABS: 500.0000 mg | ORAL_TABLET | Freq: Three times a day (TID) | ORAL | Status: DC | PRN

## 2021-08-25 MED ORDER — POLYETHYLENE GLYCOL 3350 17 G PO PACK: 17.0000 g | PACK | Freq: Every day | ORAL | Status: DC | PRN

## 2021-08-25 NOTE — Discharge Instructions (Signed)

## 2021-08-25 NOTE — Discharge Summary (Signed)
Physician Discharge Summary  SHLOME BALDREE XYV:[id number] DOB: 1950-08-18  PCP: [name], MD  Admitted from: Home Discharged to: Home  Admit date: 08/16/2021 Discharge date: 08/25/2021  Recommendations for Outpatient Follow-up:    Follow-up Information     [name], MD. Schedule an appointment as soon as possible for a visit in 1 week(s).   Specialty: Internal Medicine Why: To be seen with repeat labs (CBC & CMP). Contact information: [place] [address] [phone]         Martinique, [name], MD .   Specialty: Cardiology Contact information: [address] [place] [address] [phone]         Otis Brace, MD. Schedule an appointment as soon as possible for a visit.   Specialty: Gastroenterology Why: As needed Contact information: [place] [address] [phone]         Waynetta Sandy, MD. Schedule an appointment as soon as possible for a visit.   Specialties: Vascular Surgery, Cardiology Why: Follow-up regarding abdominal aortic aneurysm.  Call his office for an early appointment. Contact information: [address] [place] [address] [place]: None    Equipment/Devices: None    Discharge Condition: Improved and stable.   Code Status: Full Code Diet recommendation:  Discharge Diet Orders (From admission, onward)     Start     Ordered   08/25/21 0000  Diet - low sodium heart healthy        08/25/21 1124             Discharge Diagnoses:  Principal Problem:   Elevated LFTs Active Problems:   Chronic ITP (idiopathic thrombocytopenia) (HCC)   Hyperlipidemia   History of CAD   Hypertension   Aneurysm of infrarenal abdominal aorta (HCC)   OSA on CPAP   AKI (acute kidney injury) ([address])   Choledocholithiasis   Acute blood loss anemia (ABLA)   Hyperkalemia   Acute cholangitis   Brief  Summary: 71 year old male with PMH of CAD, s/p stents, chronic ITP on Nplate, diverticular bleed s/p coiling in May, AAA, as part of routine lab work was discovered to have elevated LFT and was being followed with plans for MRCP, however, in the interim he developed RUQ abdominal pain, nausea and vomiting.  CT abdomen in the ED showed thickening of the CBD wall and increased AAA size.  He underwent MRCP which was concerning for choledocholithiasis.  [facility] was consulted and performed ERCP on 7/25 which was initially uncomplicated, however in the postprocedural setting the patient developed hematemesis and hematochezia, subsequently became diaphoretic and less responsive with ectopy on telemetry.  He was transferred to ICU and intubated.  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  Patient extubated and stable.  [facility] on 7/27.  Large maroon BM 7/27 late afternoon, gradual progressive anemia since then, Hb 6.8 on 7/28, transfused.  EGD 7/29 without bleeding, diet advanced and tolerated, hemoglobin remained stable.     Assessment & Plan:   Acute gastrointestinal hemorrhage:  occurred in the postprocedural setting following ERCP, sphincterotomy from site of CBD.  Now s/p coil embolization of GDA and replaced right hepatic artery on 7/25.  Off octreotide.  Briefly on pressors, now off.  Continue PPI. Large maroon BM 7/27 late afternoon, gradual progressive anemia since then, Hb 6.8 on 7/28, s/p 2 units PRBC, GI continue to follow. S/p EGD 7/29 without bleeding.   Post EGD, patient was placed on regular diet which she has tolerated, hemoglobin continued to be stable.  GI saw him on 7/30 and cleared him for discharge on continued PPI. Patient reports some ongoing dark  stools, which may also be related to iron supplements which he continues to get while hospitalized.   Choledocholithiasis, cholangitis:  by CT and MRCP. S/p ERCP, biliary sphincterotomy, balloon extraction of stones 7/25.  Hx of cholecystectomy. LFTs have almost normalized except for ALT which also is minimally elevated at 54. IV ceftriaxone 7/23 > has completed 9 days of same.  I communicated with Dr. Therisa Doyne on day of discharge and since CBD obstruction has been removed, she recommended no further antibiotics at discharge.   Hypotension:  Transient. Briefly on vasopressors, now off. -resolved 7/26   Acute kidney injury: Had normal creatinine on 7/25.  Creatinine peaked to 1.82 on 7/26 with mild hyperkalemia 5.2.  AKI likely related to hypotension/transient hypovolemic/posthemorrhagic shock.  AKI resolved.  Avoid nephrotoxic's and avoid hypotension.   Resolved.   ITP Chronic: platelets 91 pre GIB. ABLA  -note patient has multiple antibodies  -transfuse for platelets <50k.  Platelets stable in the 100 range for the last 3 days. -Hemoglobin drifted from 9.2-6.8 on 7/28 followed by 2 units PRBC transfusion -Hemoglobin stable posttransfusion in the 8 g range for the last 3 days.   Essential hypertension: Due to soft blood pressures, amlodipine was discontinued and blood pressures have been controlled on home dose of atenolol alone.  No amlodipine at discharge.  Hyperlipidemia: Statins which were held due to abnormal LFTs will be resumed at time of discharge since LFTs have almost normalized.  Follow LFTs periodically.   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  AAA larger on CT than previously known now up to 5.3 x 4.8 cm (from 4.8 x 4.4 cm). Left internal iliac aneurysm larger now as well.  -follow up with Dr. Donzetta Matters post acute hospitalization     OSA on CPAP -CPAP QHS    Acute urinary retention: Resolved.  Foley catheter discontinued several  days ago.  Microscopic hematuria Noted on 7/22.  Unclear etiology.  Not sure if Foley catheter was already in place when this was done.  Recommend repeating urine microscopy in a couple of weeks and if still persist then may need further evaluation including urology consultation.   Body mass index is 26.98 kg/m.   Nutritional Status Nutrition Problem: Inadequate oral intake Etiology: inability to eat Signs/Symptoms: NPO status Interventions: Refer to RD note for recommendations    Consultants:   PCCM IR, reconsulted on 7/28 for PICC line placement [facility]     Procedures:   Intubation/extubation ERCP 7/25 EGD 7/25 Mesenteric angiogram and coil embolization of GDA and replaced right hepatic artery on 7/25. PICC line placement due to poor IV access and difficult stick for labs. EGD 7/29   Antimicrobials:   As noted above   Discharge Instructions  Discharge Instructions     Call MD for:   Complete by: As directed    Vomiting blood or coffee ground colored material.  Recurrent blood in stools or maroon-colored stools or black stools.  However keep in mind that you may have some dark/black stools from your ongoing  iron supplements.   Call MD for:  difficulty breathing, headache or visual disturbances   Complete by: As directed    Call MD for:  extreme fatigue   Complete by: As directed    Call MD for:  persistant dizziness or light-headedness   Complete by: As directed    Call MD for:  persistant nausea and vomiting   Complete by: As directed    Call MD for:  severe uncontrolled pain   Complete by: As directed    Call MD for:  temperature >100.4   Complete by: As directed    Diet - low sodium heart healthy   Complete by: As directed    Increase activity slowly   Complete by: As directed         Medication List     STOP taking these medications    amLODipine 10 MG tablet Commonly known as: NORVASC       TAKE these medications    acetaminophen 500 MG  tablet Commonly known as: TYLENOL Take 1 tablet (500 mg total) by mouth every 8 (eight) hours as needed for fever (pain). What changed:  how much to take when to take this reasons to take this   atenolol 25 MG tablet Commonly known as: TENORMIN Take 12.5 mg by mouth 2 (two) times daily.   ferrous sulfate 325 (65 FE) MG tablet Take 325 mg by mouth daily.   fluticasone 50 MCG/ACT nasal spray Commonly known as: FLONASE Place 2 sprays into both nostrils daily as needed for allergies or rhinitis.   latanoprost 0.005 % ophthalmic solution Commonly known as: XALATAN Place 1 drop into both eyes 2 (two) times daily.   multivitamin with minerals Tabs tablet Take 1 tablet by mouth daily.   pantoprazole 40 MG tablet Commonly known as: PROTONIX Take 40 mg by mouth 2 (two) times daily.   polyethylene glycol 17 g packet Commonly known as: MIRALAX / GLYCOLAX Take 17 g by mouth daily as needed for mild constipation. Every other day   psyllium 95 % Pack Commonly known as: HYDROCIL/METAMUCIL Take 1 packet by mouth daily as needed for mild constipation.   rosuvastatin 20 MG tablet Commonly known as: CRESTOR Take 20 mg by mouth daily.   triamcinolone cream 0.1 % Commonly known as: KENALOG Apply 1 application. topically 3 (three) times daily as needed (facial itching/rash).       No Known Allergies    Procedures/Studies: IR PICC PLACEMENT RIGHT >5 YRS INC IMG GUIDE  Result Date: 08/22/2021 INDICATION: Poor venous access, request for PICC line placement. EXAM: ULTRASOUND AND FLUOROSCOPIC GUIDED PICC LINE INSERTION MEDICATIONS: 1% lidocaine CONTRAST:  None FLUOROSCOPY TIME:  2 mGy COMPLICATIONS: None immediate. TECHNIQUE: The procedure, risks, benefits, and alternatives were explained to the patient and informed written consent was obtained. The right upper extremity was prepped with chlorhexidine in a sterile fashion, and a sterile drape was applied covering the operative field.  Maximum barrier sterile technique with sterile gowns and gloves were used for the procedure. A timeout was performed prior to the initiation of the procedure. Local anesthesia was provided with 1% lidocaine. After the overlying soft tissues were anesthetized with 1% lidocaine, a micropuncture kit was utilized to access the right brachial vein. Real-time ultrasound guidance was utilized for vascular access including the acquisition of a permanent ultrasound image documenting patency of the accessed vessel. A guidewire was advanced to the level of the superior caval-atrial junction for measurement purposes and the PICC line was cut  to length. A peel-away sheath was placed and a 39 cm, 5 [place], dual lumen was inserted to level of the superior caval-atrial junction. A post procedure spot fluoroscopic was obtained. The catheter easily aspirated and flushed and was secured in place. A dressing was placed. The patient tolerated the procedure well without immediate post procedural complication. FINDINGS: After catheter placement, the tip lies within the superior cavoatrial junction. The catheter aspirates and flushes normally and is ready for immediate use. IMPRESSION: Successful ultrasound and fluoroscopic guided placement of a right brachial vein approach, 39 cm, 5 French, dual lumen PICC with tip at the superior caval-atrial junction. The PICC line is ready for immediate use. Read by: Durenda Guthrie, PA-C Electronically Signed   By: Aletta Edouard M.D.   On: 08/22/2021 12:04   [place] EKG SITE RITE  Result Date: 08/21/2021 If Site Rite image not attached, placement could not be confirmed due to current cardiac rhythm.  IR Fluoro Guide CV Line Right  Result Date: 08/20/2021 INDICATION: 71 year old male with acute, emergent life threatening upper GI hemorrhage, after ERCP EXAM: ULTRASOUND-GUIDED ACCESS RIGHT COMMON FEMORAL ARTERY IMAGE GUIDED CENTRAL VENOUS CATHETER MESENTERIC ANGIOGRAM EMPIRIC EMBOLIZATION OF  GASTRODUODENAL ARTERY EMPIRIC EMBOLIZATION OF THE REPLACED RIGHT HEPATIC ARTERY, GIVEN POSSIBLE SOURCE OF LOWER PANCREATICODUODENAL CONTRIBUTION MEDICATIONS: NONE ANESTHESIA/SEDATION: Moderate (conscious) sedation was NOT employed during this procedure. The patient was intubated under general anesthesia Total intra-service moderate Sedation Time: 0 minutes. CONTRAST:  110 cc FLUOROSCOPY: Radiation Exposure Index (as provided by the fluoroscopic device): 7673 mGy Kerma COMPLICATIONS: None PROCEDURE: Informed consent was obtained from the patient following explanation of the procedure, risks, benefits and alternatives. The patient understands, agrees and consents for the procedure. All questions were addressed. A time out was performed prior to the initiation of the procedure. Maximal barrier sterile technique utilized including caps, mask, sterile gowns, sterile gloves, large sterile drape, hand hygiene, and Betadine prep. Ultrasound survey of the right inguinal region was performed with images stored and sent to PACs, confirming patency of the vessel. A micropuncture needle was used access the right common femoral artery under ultrasound. With excellent arterial blood flow returned, and an .018 micro wire was passed through the needle, observed enter the abdominal aorta under fluoroscopy. The needle was removed, and a micropuncture sheath was placed over the wire. The inner dilator and wire were removed, and an 035 Bentson wire was advanced under fluoroscopy into the abdominal aorta. The sheath was removed and a standard 5 [place] vascular sheath was placed. The dilator was removed and the sheath was flushed. We then proceeded with placement of right common femoral vein central venous catheter. Ultrasound survey of the right common femoral vein was performed with images stored and sent to PACs. Ultrasound confirmed patency of the vessel. A single wall needle was used access the right common femoral vein under  ultrasound. With excellent blood flow returned, a Bentson wire was passed through the needle, observed to enter the IVC under fluoroscopy. The needle was removed, and a triple-lumen central venous catheter was placed over the wire. Wire removed, and the 3 lumen on the central venous catheter were flushed and capped. Catheter was sutured in position. C2 cobra catheter was advanced on the Bentson wire through the arterial sheath, into the abdominal aorta. Wire was removed and the catheter was used to select the celiac artery. Angiogram was performed. A standard Glidewire was attempted to navigate into the common hepatic artery for further purchase given the patient's abdominal aortic aneurysm and  a tentative purchase at the celiac artery origin. This flipped the catheter into the aorta, and the catheter then engage the superior mesenteric artery. Angiogram was performed. We elected to proceed with interrogation of the SMA arterial branches which may perfuse inferior pancreaticoduodenal arteries, given the patient's hemodynamic status and urgent hemorrhage. Microcatheter and microwire, using an STC 105 cm catheter and a 14 fathom wire were navigated into the replaced right hepatic artery. Angiogram was performed. The artery was then empirically embolized, given the possibility that there was arterial contribution into inferior pancreaticoduodenal arteries supplying this region. After embolization to stasis repeat angiogram was performed. The Cobra catheter was then used to engage the celiac artery. Microwire and microcatheter were advanced through the C2 catheter into the hepatic artery. The tentative purchase at the origin did not allow microcatheter and microwire to tract within the common hepatic artery. The Cobra catheter was then removed on a Bentson wire and the 10 cm 5 French sheath was exchanged for a 35 cm 5 French sheath. C2 Cobra catheter was replaced. Again, there was poor purchase at the celiac artery  origin, and the C2 cobra catheter was exchanged for a renal double curved catheter. Once the renal double curve was in position, microcatheter microwire were used in attempt to enter the common hepatic artery. The renal double curve catheter was then exchanged for a Mickelson catheter. With the [facility] catheter formed in the lower thoracic aorta, the tip was used to engage celiac artery. This proved to have enough purchase at the origin to advance the microcatheter and microwire into the common hepatic artery. Angiogram was performed. Microwire microcatheter were then advanced into the gastroduodenal artery. Angiogram was performed. The pseudoaneurysm flushed identified, and the microcatheter was advanced beyond the origin of the pancreaticoduodenal arcade that was supplying the region. Coil embolization was then performed of the entire length of the GDA. Gel-Foam slurry was administered at the completion with stasis achieved. Microcatheter microwire were removed and a final angiogram was performed from the base catheter. Catheter and wires were removed. We elected to then exchanged the 5 French arterial sheath for a standard 10 cm 5 French arterial sheath for arterial monitoring. Sheath was sutured in position. Sterile dressings were placed. Patient remained hemodynamically unchanged throughout the procedure, on pressor support and intubated. 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 Tortuous common hepatic artery. The gastroduodenal artery and gastroepiploic artery originate from downgoing branch of  the proximal loop of the common hepatic artery. Angiogram of the SMA demonstrates replaced right hepatic artery. Attenuated arterial system in the SMA, likely related to vaso constriction/circulatory shock status Coil embolization of the replaced right hepatic artery was performed empirically, given the presence of the surgical clips in relation to the expected bleed and the possibility of pancreaticoduodenal arcade arteries from this system. 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 No flow at the conclusion. 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 Status post placement of right common femoral vein triple-lumen catheter Signed, Dulcy Fanny. Nadene Rubins, RPVI [facility] Electronically Signed   By: Corrie Mckusick D.O.   On: 08/20/2021 19:07   IR Angiogram Visceral Selective  Result Date: 08/20/2021 INDICATION: 71 year old male with acute, emergent life threatening upper GI hemorrhage, after ERCP EXAM: ULTRASOUND-GUIDED ACCESS RIGHT COMMON FEMORAL ARTERY IMAGE GUIDED CENTRAL VENOUS CATHETER MESENTERIC ANGIOGRAM EMPIRIC EMBOLIZATION OF GASTRODUODENAL ARTERY EMPIRIC EMBOLIZATION OF THE REPLACED RIGHT HEPATIC ARTERY, GIVEN POSSIBLE SOURCE OF LOWER PANCREATICODUODENAL CONTRIBUTION MEDICATIONS: NONE ANESTHESIA/SEDATION: Moderate (conscious) sedation was NOT employed during this procedure. The patient was intubated under general anesthesia Total  intra-service moderate Sedation Time: 0 minutes. CONTRAST:  110 cc FLUOROSCOPY: Radiation Exposure Index (as provided by the fluoroscopic device): 7494 mGy Kerma COMPLICATIONS: None PROCEDURE: Informed consent was obtained from the patient following explanation of the procedure, risks, benefits and alternatives. The patient understands, agrees and consents for the procedure. All questions were addressed. A time out was performed prior to the initiation of the procedure. Maximal barrier sterile technique utilized including caps, mask, sterile gowns, sterile gloves, large sterile drape, hand hygiene, and Betadine prep. Ultrasound survey of the right inguinal region was performed with images stored and sent to PACs, confirming patency of the vessel. A micropuncture needle was used access the right common femoral artery under ultrasound. With excellent arterial blood flow returned, and an .018 micro wire was passed through the needle, observed enter the abdominal aorta under fluoroscopy. The needle was removed, and a micropuncture sheath was placed over the wire. The inner dilator and wire were removed, and an 035 Bentson wire was advanced under fluoroscopy into the abdominal aorta. The sheath was removed and a standard 5 [place] vascular sheath was placed. The dilator was removed and the sheath was flushed. We then proceeded with placement of right common femoral vein central venous catheter. Ultrasound survey of the right common femoral vein was performed with images stored and sent to PACs. Ultrasound confirmed patency of the vessel. A single wall needle was used access the right common femoral vein under ultrasound. With excellent blood flow returned, a Bentson wire was passed through the needle, observed to enter the IVC under fluoroscopy. The needle was removed, and a triple-lumen central venous catheter was placed over the wire. Wire removed, and the 3 lumen on the central venous catheter were flushed and capped.  Catheter was sutured in position. C2 cobra catheter was advanced on the Bentson wire through the arterial sheath, into the abdominal aorta. Wire was removed and the catheter was used to select the celiac artery. Angiogram was performed. A standard Glidewire was attempted to navigate into the common hepatic artery for further purchase given the patient's abdominal aortic aneurysm and a tentative purchase at the celiac artery origin. This flipped the catheter into the aorta, and the catheter then engage the superior mesenteric artery. Angiogram was performed. We elected to proceed with interrogation of the SMA arterial branches which may perfuse inferior pancreaticoduodenal arteries, given the patient's hemodynamic status and urgent hemorrhage. Microcatheter and microwire, using an STC 105 cm catheter and a 14 fathom wire were navigated into the replaced right hepatic artery. Angiogram was performed. The artery was then empirically embolized, given the possibility that there was arterial contribution into inferior pancreaticoduodenal arteries supplying this region. After embolization to  stasis repeat angiogram was performed. The Cobra catheter was then used to engage the celiac artery. Microwire and microcatheter were advanced through the C2 catheter into the hepatic artery. The tentative purchase at the origin did not allow microcatheter and microwire to tract within the common hepatic artery. The Cobra catheter was then removed on a Bentson wire and the 10 cm 5 French sheath was exchanged for a 35 cm 5 French sheath. C2 Cobra catheter was replaced. Again, there was poor purchase at the celiac artery origin, and the C2 cobra catheter was exchanged for a renal double curved catheter. Once the renal double curve was in position, microcatheter microwire were used in attempt to enter the common hepatic artery. The renal double curve catheter was then exchanged for a Mickelson catheter. With the [facility] catheter formed  in the lower thoracic aorta, the tip was used to engage celiac artery. This proved to have enough purchase at the origin to advance the microcatheter and microwire into the common hepatic artery. Angiogram was performed. Microwire microcatheter were then advanced into the gastroduodenal artery. Angiogram was performed. The pseudoaneurysm flushed identified, and the microcatheter was advanced beyond the origin of the pancreaticoduodenal arcade that was supplying the region. Coil embolization was then performed of the entire length of the GDA. Gel-Foam slurry was administered at the completion with stasis achieved. Microcatheter microwire were removed and a final angiogram was performed from the base catheter. Catheter and wires were removed. We elected to then exchanged the 5 French arterial sheath for a standard 10 cm 5 French arterial sheath for arterial monitoring. Sheath was sutured in position. Sterile dressings were placed. Patient remained hemodynamically unchanged throughout the procedure, on pressor support and intubated. 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 Tortuous common hepatic artery. The gastroduodenal artery and gastroepiploic artery originate from downgoing branch of the proximal loop of the common hepatic artery. Angiogram of the SMA demonstrates replaced right hepatic artery. Attenuated arterial system in the SMA, likely related to vaso constriction/circulatory shock status Coil embolization of the replaced right hepatic artery was performed empirically, given the presence of the  surgical clips in relation to the expected bleed and the possibility of pancreaticoduodenal arcade arteries from this system. 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 No flow at the conclusion. 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 Status post placement of right common femoral vein triple-lumen catheter Signed, Dulcy Fanny. Nadene Rubins, RPVI [facility] Radiology Electronically Signed   By: Corrie Mckusick D.O.   On: 08/20/2021 10:55   IR US Guide Vasc Access Right  Result Date: 08/20/2021 INDICATION: 71 year old male with acute, emergent life threatening upper GI hemorrhage, after ERCP EXAM: ULTRASOUND-GUIDED ACCESS RIGHT COMMON FEMORAL ARTERY IMAGE GUIDED CENTRAL VENOUS CATHETER MESENTERIC ANGIOGRAM EMPIRIC  EMBOLIZATION OF GASTRODUODENAL ARTERY EMPIRIC EMBOLIZATION OF THE REPLACED RIGHT HEPATIC ARTERY, GIVEN POSSIBLE SOURCE OF LOWER PANCREATICODUODENAL CONTRIBUTION MEDICATIONS: NONE ANESTHESIA/SEDATION: Moderate (conscious) sedation was NOT employed during this procedure. The patient was intubated under general anesthesia Total intra-service moderate Sedation Time: 0 minutes. CONTRAST:  110 cc FLUOROSCOPY: Radiation Exposure Index (as provided by the fluoroscopic device): 9021 mGy Kerma COMPLICATIONS: None PROCEDURE: Informed consent was obtained from the patient following explanation of the procedure, risks, benefits and alternatives. The patient  understands, agrees and consents for the procedure. All questions were addressed. A time out was performed prior to the initiation of the procedure. Maximal barrier sterile technique utilized including caps, mask, sterile gowns, sterile gloves, large sterile drape, hand hygiene, and Betadine prep. Ultrasound survey of the right inguinal region was performed with images stored and sent to PACs, confirming patency of the vessel. A micropuncture needle was used access the right common femoral artery under ultrasound. With excellent arterial blood flow returned, and an .018 micro wire was passed through the needle, observed enter the abdominal aorta under fluoroscopy. The needle was removed, and a micropuncture sheath was placed over the wire. The inner dilator and wire were removed, and an 035 Bentson wire was advanced under fluoroscopy into the abdominal aorta. The sheath was removed and a standard 5 [place] vascular sheath was placed. The dilator was removed and the sheath was flushed. We then proceeded with placement of right common femoral vein central venous catheter. Ultrasound survey of the right common femoral vein was performed with images stored and sent to PACs. Ultrasound confirmed patency of the vessel. A single wall needle was used access the right common femoral vein under ultrasound. With excellent blood flow returned, a Bentson wire was passed through the needle, observed to enter the IVC under fluoroscopy. The needle was removed, and a triple-lumen central venous catheter was placed over the wire. Wire removed, and the 3 lumen on the central venous catheter were flushed and capped. Catheter was sutured in position. C2 cobra catheter was advanced on the Bentson wire through the arterial sheath, into the abdominal aorta. Wire was removed and the catheter was used to select the celiac artery. Angiogram was performed. A standard Glidewire was attempted to navigate into the common hepatic artery for further  purchase given the patient's abdominal aortic aneurysm and a tentative purchase at the celiac artery origin. This flipped the catheter into the aorta, and the catheter then engage the superior mesenteric artery. Angiogram was performed. We elected to proceed with interrogation of the SMA arterial branches which may perfuse inferior pancreaticoduodenal arteries, given the patient's hemodynamic status and urgent hemorrhage. Microcatheter and microwire, using an STC 105 cm catheter and a 14 fathom wire were navigated into the replaced right hepatic artery. Angiogram was performed. The artery was then empirically embolized, given the possibility that there was arterial contribution into inferior pancreaticoduodenal arteries supplying this region. After embolization to stasis repeat angiogram was performed. The Cobra catheter was then used to engage the celiac artery. Microwire and microcatheter were advanced through the C2 catheter into the hepatic artery. The tentative purchase at the origin did not allow microcatheter and microwire to tract within the common hepatic artery. The Cobra catheter was then removed on a Bentson wire and the 10 cm 5 French sheath was exchanged for a 35 cm 5 French sheath. C2 Cobra catheter was replaced. Again, there was poor purchase at the celiac artery origin, and the C2 cobra catheter was exchanged for a renal  double curved catheter. Once the renal double curve was in position, microcatheter microwire were used in attempt to enter the common hepatic artery. The renal double curve catheter was then exchanged for a Mickelson catheter. With the [facility] catheter formed in the lower thoracic aorta, the tip was used to engage celiac artery. This proved to have enough purchase at the origin to advance the microcatheter and microwire into the common hepatic artery. Angiogram was performed. Microwire microcatheter were then advanced into the gastroduodenal artery. Angiogram was performed. The  pseudoaneurysm flushed identified, and the microcatheter was advanced beyond the origin of the pancreaticoduodenal arcade that was supplying the region. Coil embolization was then performed of the entire length of the GDA. Gel-Foam slurry was administered at the completion with stasis achieved. Microcatheter microwire were removed and a final angiogram was performed from the base catheter. Catheter and wires were removed. We elected to then exchanged the 5 French arterial sheath for a standard 10 cm 5 French arterial sheath for arterial monitoring. Sheath was sutured in position. Sterile dressings were placed. Patient remained hemodynamically unchanged throughout the procedure, on pressor support and intubated. 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 Tortuous common hepatic artery. The gastroduodenal artery and gastroepiploic artery originate from downgoing branch of the proximal loop of the common hepatic artery. Angiogram of the SMA demonstrates replaced right hepatic artery. Attenuated arterial system in the SMA, likely related to vaso constriction/circulatory shock status Coil embolization of the replaced right hepatic artery was performed empirically, given the presence of the surgical clips in relation to the expected bleed and the possibility of pancreaticoduodenal arcade arteries from this system. The patient has patent portal vein on prior CT Angiogram of the gastroduodenal artery demonstrates the pseudoaneurysm originating from superior pancreaticoduodenal branches with active extravasation  observed (X a series 14) Coil embolization was performed along the GDA to stasis. No flow at the conclusion. 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 Status post placement of right common femoral vein triple-lumen catheter Signed, Dulcy Fanny. Nadene Rubins, RPVI Vascular and Interventional Radiology Specialists [facility] Radiology Electronically Signed   By: Corrie Mckusick D.O.   On: 08/20/2021 10:55   IR Angiogram Selective Each Additional Vessel  Result Date: 08/20/2021 INDICATION: 71 year old male with acute, emergent life threatening upper GI hemorrhage, after ERCP EXAM: ULTRASOUND-GUIDED ACCESS RIGHT COMMON FEMORAL ARTERY IMAGE GUIDED CENTRAL VENOUS CATHETER MESENTERIC ANGIOGRAM EMPIRIC EMBOLIZATION OF GASTRODUODENAL ARTERY EMPIRIC EMBOLIZATION OF THE REPLACED RIGHT HEPATIC ARTERY, GIVEN POSSIBLE SOURCE OF LOWER PANCREATICODUODENAL CONTRIBUTION MEDICATIONS: NONE ANESTHESIA/SEDATION: Moderate (conscious) sedation was NOT employed during this procedure. The patient was intubated under general anesthesia Total intra-service moderate Sedation Time: 0 minutes. CONTRAST:  110 cc FLUOROSCOPY: Radiation Exposure Index (as provided by the fluoroscopic device): 8127 mGy Kerma COMPLICATIONS: None PROCEDURE: Informed consent was obtained from the patient following explanation of the procedure, risks, benefits and alternatives. The patient understands, agrees and consents for the procedure. All questions were addressed. A time out was performed  prior to the initiation of the procedure. Maximal barrier sterile technique utilized including caps, mask, sterile gowns, sterile gloves, large sterile drape, hand hygiene, and Betadine prep. Ultrasound  survey of the right inguinal region was performed with images stored and sent to PACs, confirming patency of the vessel. A micropuncture needle was used access the right common femoral artery under ultrasound. With excellent arterial blood flow returned, and an .018 micro wire was passed through the needle, observed enter the abdominal aorta under fluoroscopy. The needle was removed, and a micropuncture sheath was placed over the wire. The inner dilator and wire were removed, and an 035 Bentson wire was advanced under fluoroscopy into the abdominal aorta. The sheath was removed and a standard 5 [place] vascular sheath was placed. The dilator was removed and the sheath was flushed. We then proceeded with placement of right common femoral vein central venous catheter. Ultrasound survey of the right common femoral vein was performed with images stored and sent to PACs. Ultrasound confirmed patency of the vessel. A single wall needle was used access the right common femoral vein under ultrasound. With excellent blood flow returned, a Bentson wire was passed through the needle, observed to enter the IVC under fluoroscopy. The needle was removed, and a triple-lumen central venous catheter was placed over the wire. Wire removed, and the 3 lumen on the central venous catheter were flushed and capped. Catheter was sutured in position. C2 cobra catheter was advanced on the Bentson wire through the arterial sheath, into the abdominal aorta. Wire was removed and the catheter was used to select the celiac artery. Angiogram was performed. A standard Glidewire was attempted to navigate into the common hepatic artery for further purchase given the patient's abdominal aortic aneurysm and a tentative purchase at the celiac artery origin. This flipped the catheter into the aorta, and the catheter then engage the superior mesenteric artery. Angiogram was performed. We elected to proceed with interrogation of the SMA arterial branches  which may perfuse inferior pancreaticoduodenal arteries, given the patient's hemodynamic status and urgent hemorrhage. Microcatheter and microwire, using an STC 105 cm catheter and a 14 fathom wire were navigated into the replaced right hepatic artery. Angiogram was performed. The artery was then empirically embolized, given the possibility that there was arterial contribution into inferior pancreaticoduodenal arteries supplying this region. After embolization to stasis repeat angiogram was performed. The Cobra catheter was then used to engage the celiac artery. Microwire and microcatheter were advanced through the C2 catheter into the hepatic artery. The tentative purchase at the origin did not allow microcatheter and microwire to tract within the common hepatic artery. The Cobra catheter was then removed on a Bentson wire and the 10 cm 5 French sheath was exchanged for a 35 cm 5 French sheath. C2 Cobra catheter was replaced. Again, there was poor purchase at the celiac artery origin, and the C2 cobra catheter was exchanged for a renal double curved catheter. Once the renal double curve was in position, microcatheter microwire were used in attempt to enter the common hepatic artery. The renal double curve catheter was then exchanged for a Mickelson catheter. With the [facility] catheter formed in the lower thoracic aorta, the tip was used to engage celiac artery. This proved to have enough purchase at the origin to advance the microcatheter and microwire into the common hepatic artery. Angiogram was performed. Microwire microcatheter were then advanced into the gastroduodenal artery. Angiogram was performed. The pseudoaneurysm flushed identified, and the microcatheter was advanced beyond the  origin of the pancreaticoduodenal arcade that was supplying the region. Coil embolization was then performed of the entire length of the GDA. Gel-Foam slurry was administered at the completion with stasis achieved. Microcatheter  microwire were removed and a final angiogram was performed from the base catheter. Catheter and wires were removed. We elected to then exchanged the 5 French arterial sheath for a standard 10 cm 5 French arterial sheath for arterial monitoring. Sheath was sutured in position. Sterile dressings were placed. Patient remained hemodynamically unchanged throughout the procedure, on pressor support and intubated. 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 Tortuous common hepatic artery. The gastroduodenal artery and gastroepiploic artery originate from downgoing branch of the proximal loop of the common hepatic artery. Angiogram of the SMA demonstrates replaced right hepatic artery. Attenuated arterial system in the SMA, likely related to vaso constriction/circulatory shock status Coil embolization of the replaced right hepatic artery was performed empirically, given the presence of the surgical clips in relation to the expected bleed and the possibility of pancreaticoduodenal arcade arteries from this system. 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 No flow at the conclusion. 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 Status post placement of right common femoral vein triple-lumen catheter Signed, Dulcy Fanny. Nadene Rubins, RPVI Vascular and Interventional Radiology Specialists [facility] Radiology Electronically Signed   By: Corrie Mckusick D.O.   On: 08/20/2021 10:55   IR EMBO ART  VEN HEMORR LYMPH EXTRAV  INC GUIDE ROADMAPPING  Result Date: 08/20/2021 INDICATION: 71 year old male with acute, emergent life threatening upper GI hemorrhage, after ERCP EXAM: ULTRASOUND-GUIDED ACCESS RIGHT COMMON FEMORAL ARTERY IMAGE GUIDED CENTRAL VENOUS CATHETER MESENTERIC ANGIOGRAM EMPIRIC EMBOLIZATION OF GASTRODUODENAL ARTERY EMPIRIC EMBOLIZATION OF THE REPLACED RIGHT HEPATIC ARTERY, GIVEN POSSIBLE SOURCE OF LOWER PANCREATICODUODENAL CONTRIBUTION MEDICATIONS: NONE ANESTHESIA/SEDATION: Moderate (conscious) sedation was NOT employed during this procedure. The patient was intubated under general anesthesia Total intra-service moderate Sedation Time: 0 minutes. CONTRAST:  110 cc FLUOROSCOPY: Radiation Exposure Index (as provided by the fluoroscopic device): 2703 mGy Kerma COMPLICATIONS: None PROCEDURE: Informed consent was obtained from the patient following explanation of the procedure, risks, benefits and alternatives. The patient understands, agrees and consents for the procedure. All questions were addressed. A time out was performed prior to the initiation of the procedure. Maximal barrier sterile technique utilized including caps, mask, sterile gowns, sterile gloves, large sterile drape, hand hygiene, and Betadine prep. Ultrasound survey of the right inguinal region was performed with images stored and sent to PACs, confirming patency of the vessel. A micropuncture needle was used access the right common femoral artery under ultrasound. With excellent arterial blood flow returned, and an .018 micro wire was passed through  the needle, observed enter the abdominal aorta under fluoroscopy. The needle was removed, and a micropuncture sheath  was placed over the wire. The inner dilator and wire were removed, and an 035 Bentson wire was advanced under fluoroscopy into the abdominal aorta. The sheath was removed and a standard 5 [place] vascular sheath was placed. The dilator was removed and the sheath was flushed. We then proceeded with placement of right common femoral vein central venous catheter. Ultrasound survey of the right common femoral vein was performed with images stored and sent to PACs. Ultrasound confirmed patency of the vessel. A single wall needle was used access the right common femoral vein under ultrasound. With excellent blood flow returned, a Bentson wire was passed through the needle, observed to enter the IVC under fluoroscopy. The needle was removed, and a triple-lumen central venous catheter was placed over the wire. Wire removed, and the 3 lumen on the central venous catheter were flushed and capped. Catheter was sutured in position. C2 cobra catheter was advanced on the Bentson wire through the arterial sheath, into the abdominal aorta. Wire was removed and the catheter was used to select the celiac artery. Angiogram was performed. A standard Glidewire was attempted to navigate into the common hepatic artery for further purchase given the patient's abdominal aortic aneurysm and a tentative purchase at the celiac artery origin. This flipped the catheter into the aorta, and the catheter then engage the superior mesenteric artery. Angiogram was performed. We elected to proceed with interrogation of the SMA arterial branches which may perfuse inferior pancreaticoduodenal arteries, given the patient's hemodynamic status and urgent hemorrhage. Microcatheter and microwire, using an STC 105 cm catheter and a 14 fathom wire were navigated into the replaced right hepatic artery. Angiogram was performed. The artery was then  empirically embolized, given the possibility that there was arterial contribution into inferior pancreaticoduodenal arteries supplying this region. After embolization to stasis repeat angiogram was performed. The Cobra catheter was then used to engage the celiac artery. Microwire and microcatheter were advanced through the C2 catheter into the hepatic artery. The tentative purchase at the origin did not allow microcatheter and microwire to tract within the common hepatic artery. The Cobra catheter was then removed on a Bentson wire and the 10 cm 5 French sheath was exchanged for a 35 cm 5 French sheath. C2 Cobra catheter was replaced. Again, there was poor purchase at the celiac artery origin, and the C2 cobra catheter was exchanged for a renal double curved catheter. Once the renal double curve was in position, microcatheter microwire were used in attempt to enter the common hepatic artery. The renal double curve catheter was then exchanged for a Mickelson catheter. With the [facility] catheter formed in the lower thoracic aorta, the tip was used to engage celiac artery. This proved to have enough purchase at the origin to advance the microcatheter and microwire into the common hepatic artery. Angiogram was performed. Microwire microcatheter were then advanced into the gastroduodenal artery. Angiogram was performed. The pseudoaneurysm flushed identified, and the microcatheter was advanced beyond the origin of the pancreaticoduodenal arcade that was supplying the region. Coil embolization was then performed of the entire length of the GDA. Gel-Foam slurry was administered at the completion with stasis achieved. Microcatheter microwire were removed and a final angiogram was performed from the base catheter. Catheter and wires were removed. We elected to then exchanged the 5 French arterial sheath for a standard 10 cm 5 French arterial sheath for arterial monitoring. Sheath was sutured in position. Sterile dressings  were placed. Patient remained hemodynamically unchanged throughout the procedure, on pressor support  and intubated. 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 Tortuous common hepatic artery. The gastroduodenal artery and gastroepiploic artery originate from downgoing branch of the proximal loop of the common hepatic artery. Angiogram of the SMA demonstrates replaced right hepatic artery. Attenuated arterial system in the SMA, likely related to vaso constriction/circulatory shock status Coil embolization of the replaced right hepatic artery was performed empirically, given the presence of the surgical clips in relation to the expected bleed and the possibility of pancreaticoduodenal arcade arteries from this system. 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 No flow at the conclusion. 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 Status post placement of right common femoral vein triple-lumen catheter Signed, Dulcy Fanny. Nadene Rubins, RPVI [facility]  Radiology Electronically Signed   By: Corrie Mckusick D.O.   On: 08/20/2021 10:55   IR Angiogram Selective Each Additional Vessel  Result Date: 08/20/2021 INDICATION: 71 year old male with acute, emergent life threatening upper GI hemorrhage, after ERCP EXAM: ULTRASOUND-GUIDED ACCESS RIGHT COMMON FEMORAL ARTERY IMAGE GUIDED CENTRAL VENOUS CATHETER MESENTERIC ANGIOGRAM EMPIRIC EMBOLIZATION OF GASTRODUODENAL ARTERY EMPIRIC EMBOLIZATION OF THE REPLACED RIGHT HEPATIC ARTERY, GIVEN POSSIBLE SOURCE OF LOWER PANCREATICODUODENAL CONTRIBUTION MEDICATIONS: NONE ANESTHESIA/SEDATION: Moderate (conscious) sedation was NOT employed during this procedure. The patient was intubated under general anesthesia Total intra-service moderate Sedation Time: 0 minutes. CONTRAST:  110 cc FLUOROSCOPY: Radiation Exposure Index (as provided by the fluoroscopic device): 1696 mGy Kerma COMPLICATIONS: None PROCEDURE: Informed consent was obtained from the patient following explanation of the procedure, risks, benefits and alternatives. The patient understands, agrees and consents for the procedure. All questions were addressed. A time out was performed prior to the initiation of the procedure. Maximal barrier sterile technique utilized including caps, mask, sterile gowns, sterile gloves, large sterile drape, hand hygiene, and Betadine prep. Ultrasound survey of the right inguinal region was performed with images stored and sent to PACs, confirming patency of the vessel. A micropuncture needle was used access the right common femoral artery under ultrasound. With excellent arterial blood flow returned, and an .018 micro wire was passed through the needle, observed enter the abdominal aorta under fluoroscopy. The needle was removed, and a micropuncture sheath was placed over the wire. The inner dilator and wire were removed, and an 035 Bentson wire was advanced under fluoroscopy into the abdominal aorta. The sheath was removed and a standard 5  [place] vascular sheath was placed. The dilator was removed and the sheath was flushed. We then proceeded with placement of right common femoral vein central venous catheter. Ultrasound survey of the right common femoral vein was performed with images stored and sent to PACs. Ultrasound confirmed patency of the vessel. A single wall needle was used access the right common femoral vein under ultrasound. With excellent  blood flow returned, a Bentson wire was passed through the needle, observed to enter the IVC under fluoroscopy. The needle was removed, and a triple-lumen central venous catheter was placed over the wire. Wire removed, and the 3 lumen on the central venous catheter were flushed and capped. Catheter was sutured in position. C2 cobra catheter was advanced on the Bentson wire through the arterial sheath, into the abdominal aorta. Wire was removed and the catheter was used to select the celiac artery. Angiogram was performed. A standard Glidewire was attempted to navigate into the common hepatic artery for further purchase given the patient's abdominal aortic aneurysm and a tentative purchase at the celiac artery origin. This flipped the catheter into the aorta, and the catheter then engage the superior mesenteric artery. Angiogram was performed. We elected to proceed with interrogation of the SMA arterial branches which may perfuse inferior pancreaticoduodenal arteries, given the patient's hemodynamic status and urgent hemorrhage. Microcatheter and microwire, using an STC 105 cm catheter and a 14 fathom wire were navigated into the replaced right hepatic artery. Angiogram was performed. The artery was then empirically embolized, given the possibility that there was arterial contribution into inferior pancreaticoduodenal arteries supplying this region. After embolization to stasis repeat angiogram was performed. The Cobra catheter was then used to engage the celiac artery. Microwire and microcatheter were  advanced through the C2 catheter into the hepatic artery. The tentative purchase at the origin did not allow microcatheter and microwire to tract within the common hepatic artery. The Cobra catheter was then removed on a Bentson wire and the 10 cm 5 French sheath was exchanged for a 35 cm 5 French sheath. C2 Cobra catheter was replaced. Again, there was poor purchase at the celiac artery origin, and the C2 cobra catheter was exchanged for a renal double curved catheter. Once the renal double curve was in position, microcatheter microwire were used in attempt to enter the common hepatic artery. The renal double curve catheter was then exchanged for a Mickelson catheter. With the [facility] catheter formed in the lower thoracic aorta, the tip was used to engage celiac artery. This proved to have enough purchase at the origin to advance the microcatheter and microwire into the common hepatic artery. Angiogram was performed. Microwire microcatheter were then advanced into the gastroduodenal artery. Angiogram was performed. The pseudoaneurysm flushed identified, and the microcatheter was advanced beyond the origin of the pancreaticoduodenal arcade that was supplying the region. Coil embolization was then performed of the entire length of the GDA. Gel-Foam slurry was administered at the completion with stasis achieved. Microcatheter microwire were removed and a final angiogram was performed from the base catheter. Catheter and wires were removed. We elected to then exchanged the 5 French arterial sheath for a standard 10 cm 5 French arterial sheath for arterial monitoring. Sheath was sutured in position. Sterile dressings were placed. Patient remained hemodynamically unchanged throughout the procedure, on pressor support and intubated. Approximately 25 cc blood loss No complications encountered FINDINGS: Ultrasound of the right inguinal region demonstrates patent common femoral vein Ultrasound of the right inguinal region  demonstrates patent common femoral artery Tip of the triple-lumen catheter terminates in the right iliac vein Celiac artery angiogram demonstrates ectasia of the celiac trunk, with origin identified of the splenic artery, common hepatic artery, and left gastric artery. Tortuous common hepatic artery. The gastroduodenal artery and gastroepiploic artery originate from downgoing branch of the proximal loop of the common hepatic artery. Angiogram of the SMA demonstrates replaced right hepatic artery.  Attenuated arterial system in the SMA, likely related to vaso constriction/circulatory shock status Coil embolization of the replaced right hepatic artery was performed empirically, given the presence of the surgical clips in relation to the expected bleed and the possibility of pancreaticoduodenal arcade arteries from this system. 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 No flow at the conclusion. 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 Status post placement of right common femoral vein triple-lumen catheter Signed, Dulcy Fanny. Nadene Rubins, RPVI Vascular and Interventional Radiology Specialists [facility] Radiology Electronically Signed   By: Corrie Mckusick D.O.   On: 08/20/2021 10:55   IR US Guide Vasc Access Right  Result Date: 08/20/2021 INDICATION: 71 year old male with acute, emergent life threatening upper GI hemorrhage, after ERCP EXAM: ULTRASOUND-GUIDED ACCESS RIGHT COMMON FEMORAL ARTERY IMAGE GUIDED  CENTRAL VENOUS CATHETER MESENTERIC ANGIOGRAM EMPIRIC EMBOLIZATION OF GASTRODUODENAL ARTERY EMPIRIC EMBOLIZATION OF THE REPLACED RIGHT HEPATIC ARTERY, GIVEN POSSIBLE SOURCE OF LOWER PANCREATICODUODENAL CONTRIBUTION MEDICATIONS: NONE ANESTHESIA/SEDATION: Moderate (conscious) sedation was NOT employed during this procedure. The patient was intubated under general anesthesia Total intra-service moderate Sedation Time: 0 minutes. CONTRAST:  110 cc FLUOROSCOPY: Radiation Exposure Index (as provided by the fluoroscopic device): 3810 mGy Kerma COMPLICATIONS: None PROCEDURE: Informed consent was obtained from the patient following explanation of the procedure, risks, benefits and alternatives. The patient understands, agrees and consents for the procedure. All questions were addressed. A time out was performed prior to the initiation of the procedure. Maximal barrier sterile technique utilized including caps, mask, sterile gowns, sterile gloves, large sterile drape, hand hygiene, and Betadine prep. Ultrasound survey of the right inguinal region was performed with images stored and sent to PACs, confirming patency of the vessel. A micropuncture needle was used access the right common femoral artery under ultrasound. With excellent arterial blood flow returned, and an .018 micro wire was passed through the needle, observed enter the abdominal aorta under fluoroscopy. The needle was removed, and a micropuncture sheath was placed over the wire. The inner dilator and wire were removed, and an 035 Bentson wire was advanced under fluoroscopy into the abdominal aorta. The sheath was removed and a standard 5 [place] vascular sheath was placed. The dilator was removed and the sheath was flushed. We then proceeded with placement of right common femoral vein central venous catheter. Ultrasound survey of the right common femoral vein was performed with images stored and sent to PACs. Ultrasound confirmed patency of the vessel. A single  wall needle was used access the right common femoral vein under ultrasound. With excellent blood flow returned, a Bentson wire was passed through the needle, observed to enter the IVC under fluoroscopy. The needle was removed, and a triple-lumen central venous catheter was placed over the wire. Wire removed, and the 3 lumen on the central venous catheter were flushed and capped. Catheter was sutured in position. C2 cobra catheter was advanced on the Bentson wire through the arterial sheath, into the abdominal aorta. Wire was removed and the catheter was used to select the celiac artery. Angiogram was performed. A standard Glidewire was attempted to navigate into the common hepatic artery for  further purchase given the patient's abdominal aortic aneurysm and a tentative purchase at the celiac artery origin. This flipped the catheter into the aorta, and the catheter then engage the superior mesenteric artery. Angiogram was performed. We elected to proceed with interrogation of the SMA arterial branches which may perfuse inferior pancreaticoduodenal arteries, given the patient's hemodynamic status and urgent hemorrhage. Microcatheter and microwire, using an STC 105 cm catheter and a 14 fathom wire were navigated into the replaced right hepatic artery. Angiogram was performed. The artery was then empirically embolized, given the possibility that there was arterial contribution into inferior pancreaticoduodenal arteries supplying this region. After embolization to stasis repeat angiogram was performed. The Cobra catheter was then used to engage the celiac artery. Microwire and microcatheter were advanced through the C2 catheter into the hepatic artery. The tentative purchase at the origin did not allow microcatheter and microwire to tract within the common hepatic artery. The Cobra catheter was then removed on a Bentson wire and the 10 cm 5 French sheath was exchanged for a 35 cm 5 French sheath. C2 Cobra catheter was  replaced. Again, there was poor purchase at the celiac artery origin, and the C2 cobra catheter was exchanged for a renal double curved catheter. Once the renal double curve was in position, microcatheter microwire were used in attempt to enter the common hepatic artery. The renal double curve catheter was then exchanged for a Mickelson catheter. With the [facility] catheter formed in the lower thoracic aorta, the tip was used to engage celiac artery. This proved to have enough purchase at the origin to advance the microcatheter and microwire into the common hepatic artery. Angiogram was performed. Microwire microcatheter were then advanced into the gastroduodenal artery. Angiogram was performed. The pseudoaneurysm flushed identified, and the microcatheter was advanced beyond the origin of the pancreaticoduodenal arcade that was supplying the region. Coil embolization was then performed of the entire length of the GDA. Gel-Foam slurry was administered at the completion with stasis achieved. Microcatheter microwire were removed and a final angiogram was performed from the base catheter. Catheter and wires were removed. We elected to then exchanged the 5 French arterial sheath for a standard 10 cm 5 French arterial sheath for arterial monitoring. Sheath was sutured in position. Sterile dressings were placed. Patient remained hemodynamically unchanged throughout the procedure, on pressor support and intubated. 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 Tortuous common hepatic artery. The gastroduodenal artery  and gastroepiploic artery originate from downgoing branch of the proximal loop of the common hepatic artery. Angiogram of the SMA demonstrates replaced right hepatic artery. Attenuated arterial system in the SMA, likely related to vaso constriction/circulatory shock status Coil embolization of the replaced right hepatic artery was performed empirically, given the presence of the surgical clips in relation to the expected bleed and the possibility of pancreaticoduodenal arcade arteries from this system. 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 No flow at the conclusion. 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 Status post placement of right common femoral vein triple-lumen catheter Signed, Dulcy Fanny. Nadene Rubins, RPVI Vascular and Interventional Radiology Specialists [facility] Radiology Electronically Signed   By: Corrie Mckusick D.O.   On: 08/20/2021 10:55   DG CHEST PORT 1 VIEW  Result Date: 08/20/2021 CLINICAL DATA:  Intubated EXAM: PORTABLE CHEST 1 VIEW COMPARISON:  CTA chest dated 06/18/2021 FINDINGS: Right basilar atelectasis. Left lung is clear. No pleural effusion or pneumothorax. Endotracheal tube terminates 3 cm above the carina. Heart is normal in size.  Prosthetic aortic valve. Median sternotomy. IMPRESSION: Endotracheal tube terminates 3 cm above the carina. Electronically Signed   By: Julian Hy M.D.   On: 08/20/2021 02:19   CT ANGIO GI BLEED  Addendum Date: 08/19/2021    ADDENDUM REPORT: 08/19/2021 20:39 ADDENDUM: These results were called by telephone at the time of interpretation on 08/19/2021 at 8:35 pm to provider Dr Oletta Darter, who verbally acknowledged these results. Electronically Signed   By: Julian Hy M.D.   On: 08/19/2021 20:39   Result Date: 08/19/2021 CLINICAL DATA:  Lower GI bleed EXAM: CTA ABDOMEN AND PELVIS WITHOUT AND WITH CONTRAST TECHNIQUE: Multidetector CT imaging of the abdomen and pelvis was performed using the standard protocol during bolus administration of intravenous contrast. Multiplanar reconstructed images and MIPs were obtained and reviewed to evaluate the vascular anatomy. RADIATION DOSE REDUCTION: This exam was performed according to the departmental dose-optimization program which includes automated exposure control, adjustment of the mA and/or kV according to patient size and/or use of iterative reconstruction technique. CONTRAST:  131mL OMNIPAQUE IOHEXOL 350 MG/ML SOLN COMPARISON:  MRI abdomen dated 08/17/2021. CT abdomen/pelvis dated 08/16/2021. FINDINGS: VASCULAR Aorta: 4.9 x 5.1 cm infrarenal abdominal aortic aneurysm. Atherosclerotic calcifications of the aortic arch. Patent. Celiac: Patent. SMA: Patent. Renals: Patent bilaterally. IMA: Patent. Inflow: Patent bilaterally. Atherosclerotic calcifications. 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 3.7 cm short axis left common iliac artery aneurysm. Proximal Outflow: Patent. Veins: Unremarkable. Active intravasation of contrast within the lumen of the 2nd portion of the duodenum (series 5/images 91 and 94). This reflects the site of the patient's active GI bleeding. Associated findings described below. Review of the MIP images confirms the above findings. NON-VASCULAR Lower chest: Lung bases are clear.  Prosthetic aortic valve. Hepatobiliary: Subcentimeter hepatic cysts. Mild intrahepatic and extrahepatic ductal dilatation. Hyperdense material within the common duct (series  7/image 31), possibly postprocedural versus hemorrhage. Pancreas: Within normal limits. Spleen: Within normal limits. Adrenals/Urinary Tract: Adrenal glands are within normal limits. Bilateral renal cysts, most of which are simple, including a 5.0 cm right lower pole renal sinus cyst (series 7/image 34). Additional 2.1 cm lateral left upper pole cyst with calcified septations (series 2/image 30), but no enhancement following contrast administration, benign (Bosniak II). No renal calculi or hydronephrosis. Bladder is within normal limits. Stomach/Bowel: Enteric tube terminates in the distal gastric body. 4.3 x 6.3 cm hematoma expanding the 1st and 2nd portion of the duodenum (series 2/image 38) and an adjacent duodenal diverticulum (coronal image 68). This corresponds to the site of active GI bleeding identified above. Hyperdense material/hemorrhage extends into the 3rd portion of the duodenum (series 2/image 42). No evidence of bowel obstruction. Normal appendix (series 5/image 120). Extensive sigmoid diverticulosis, without evidence of diverticulitis. Lymphatic: Small upper abdominal lymph nodes. No suspicious  abdominopelvic lymphadenopathy. Reproductive: Prostate is unremarkable. Other: No abdominopelvic ascites. Musculoskeletal: Mild degenerative changes the lumbar spine. Median sternotomy. IMPRESSION: Active GI bleeding within the 2nd portion the duodenum. Associated large hematoma. Hyperdense material in the common duct, possibly postprocedural versus hemorrhage given additional findings. Associated mild intrahepatic and extrahepatic ductal dilatation. 5.1 cm infrarenal abdominal aortic aneurysm. 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 Consider vascular surgery consultation. Aortic Atherosclerosis (ICD10-I70.0). Electronically Signed: By: Julian Hy M.D. On: 08/19/2021 20:25   DG Abd 1 View  Result Date: 08/19/2021 CLINICAL DATA:  Encounter for nasogastric tube placement.  EXAM: ABDOMEN - 1 VIEW COMPARISON:  CT abdomen and pelvis 08/16/2021 FINDINGS: AP portable view of the abdomen was obtained. Nasogastric tube extends into the abdomen and the tip is in the gastric body region. Evidence for cholecystectomy clips. Evidence for embolization coils in the upper pelvis. Nonobstructive bowel gas pattern. Severe joint space loss along the superior right hip joint with remodeling in the right femoral head. IMPRESSION: 1. Nasogastric tube tip in the stomach body region. 2. Severe joint space loss and degenerative changes in the right hip. Electronically Signed   By: Markus Daft M.D.   On: 08/19/2021 16:20   DG ERCP  Result Date: 08/19/2021 CLINICAL DATA:  Suspected choledocholithiasis EXAM: ERCP TECHNIQUE: Multiple spot images obtained with the fluoroscopic device and submitted for interpretation post-procedure. COMPARISON:  MRCP 08/17/2021 FINDINGS: Series of fluoroscopic spot images document endoscopic cannulation and opacification of the CBD. Filling defects are noted in the distal CBD consistent with choledocholithiasis. Subsequent image demonstrates balloon catheter placement into the common bile duct. The intrahepatic ducts are incompletely visualized, mildly dilated centrally. Cholecystectomy clips. No extravasation evident. IMPRESSION: Endoscopic CBD cannulation and intervention as above. These images were submitted for radiologic interpretation only. Please see the procedural report for the amount of contrast and the fluoroscopy time utilized. Electronically Signed   By: Lucrezia Europe M.D.   On: 08/19/2021 14:16   MR ABDOMEN MRCP W WO CONTAST  Result Date: 08/17/2021 CLINICAL DATA:  Biliary obstruction suspected EXAM: MRI ABDOMEN WITHOUT AND WITH CONTRAST (INCLUDING MRCP) TECHNIQUE: Multiplanar multisequence MR imaging of the abdomen was performed both before and after the administration of intravenous contrast. Heavily T2-weighted images of the biliary and pancreatic ducts were  obtained, and three-dimensional MRCP images were rendered by post processing. CONTRAST:  110mL GADAVIST GADOBUTROL 1 MMOL/ML IV SOLN COMPARISON:  CT abdomen pelvis, 08/16/2021 FINDINGS: Lower chest: No acute findings. Hepatobiliary: No mass or other parenchymal abnormality identified. Status post cholecystectomy. Suspect at least one gallstone in the central common bile duct near the ampulla measuring 0.6 cm (series 12, image 19, series 11, image 21). No significant biliary ductal dilatation, however there is periportal edema (series 3, image 19) Pancreas: No mass, inflammatory changes, or other parenchymal abnormality identified.No pancreatic ductal dilatation. Spleen:  Within normal limits in size and appearance. Adrenals/Urinary Tract: Normal adrenal glands. Multiple bilateral renal cortical and parapelvic cysts. No solid mass or suspicious contrast enhancement. Heterogeneous, septated cyst of the superior pole of the left kidney, which is calcified on comparison CT (series 17, image 21). No renal masses or suspicious contrast enhancement identified. No evidence of hydronephrosis. Stomach/Bowel: Descending colonic diverticulosis. Visualized portions within the abdomen are otherwise unremarkable. Vascular/Lymphatic: No pathologically enlarged lymph nodes identified. Partially imaged abdominal aortic aneurysm measuring 4.9 x 4.7 cm (series 12, image 20). Other:  None. Musculoskeletal: No suspicious osseous lesions identified. IMPRESSION: 1. Suspect at least one gallstone in the  central common bile duct near the ampulla measuring 0.6 cm. No significant biliary ductal dilatation, however there is periportal edema. Findings are concerning for choledocholithiasis status post cholecystectomy. 2. Partially imaged abdominal aortic aneurysm measuring 4.9 x 4.7 cm. 3. Descending colonic diverticulosis. These results will be called to the ordering clinician or representative by the Radiologist Assistant, and communication  documented in the PACS or [employer]. Electronically Signed   By: Delanna Ahmadi M.D.   On: 08/17/2021 12:10   MR 3D Recon At Scanner  Result Date: 08/17/2021 CLINICAL DATA:  Biliary obstruction suspected EXAM: MRI ABDOMEN WITHOUT AND WITH CONTRAST (INCLUDING MRCP) TECHNIQUE: Multiplanar multisequence MR imaging of the abdomen was performed both before and after the administration of intravenous contrast. Heavily T2-weighted images of the biliary and pancreatic ducts were obtained, and three-dimensional MRCP images were rendered by post processing. CONTRAST:  2mL GADAVIST GADOBUTROL 1 MMOL/ML IV SOLN COMPARISON:  CT abdomen pelvis, 08/16/2021 FINDINGS: Lower chest: No acute findings. Hepatobiliary: No mass or other parenchymal abnormality identified. Status post cholecystectomy. Suspect at least one gallstone in the central common bile duct near the ampulla measuring 0.6 cm (series 12, image 19, series 11, image 21). No significant biliary ductal dilatation, however there is periportal edema (series 3, image 19) Pancreas: No mass, inflammatory changes, or other parenchymal abnormality identified.No pancreatic ductal dilatation. Spleen:  Within normal limits in size and appearance. Adrenals/Urinary Tract: Normal adrenal glands. Multiple bilateral renal cortical and parapelvic cysts. No solid mass or suspicious contrast enhancement. Heterogeneous, septated cyst of the superior pole of the left kidney, which is calcified on comparison CT (series 17, image 21). No renal masses or suspicious contrast enhancement identified. No evidence of hydronephrosis. Stomach/Bowel: Descending colonic diverticulosis. Visualized portions within the abdomen are otherwise unremarkable. Vascular/Lymphatic: No pathologically enlarged lymph nodes identified. Partially imaged abdominal aortic aneurysm measuring 4.9 x 4.7 cm (series 12, image 20). Other:  None. Musculoskeletal: No suspicious osseous lesions identified. IMPRESSION: 1.  Suspect at least one gallstone in the central common bile duct near the ampulla measuring 0.6 cm. No significant biliary ductal dilatation, however there is periportal edema. Findings are concerning for choledocholithiasis status post cholecystectomy. 2. Partially imaged abdominal aortic aneurysm measuring 4.9 x 4.7 cm. 3. Descending colonic diverticulosis. These results will be called to the ordering clinician or representative by the Radiologist Assistant, and communication documented in the PACS or [employer]. Electronically Signed   By: Delanna Ahmadi M.D.   On: 08/17/2021 12:10   CT Abdomen Pelvis W Contrast  Result Date: 08/16/2021 CLINICAL DATA:  Abdominal pain, acute, nonlocalized ruq abd pain. right upper quadrant pain , pt has no gall bladder, has a mrcp tomorrow at [facility]. EXAM: CT ABDOMEN AND PELVIS WITH CONTRAST TECHNIQUE: Multidetector CT imaging of the abdomen and pelvis was performed using the standard protocol following bolus administration of intravenous contrast. RADIATION DOSE REDUCTION: This exam was performed according to the departmental dose-optimization program which includes automated exposure control, adjustment of the mA and/or kV according to patient size and/or use of iterative reconstruction technique. CONTRAST:  19mL OMNIPAQUE IOHEXOL 300 MG/ML  SOLN COMPARISON:  CT abdomen pelvis angiography 05/11/2021 FINDINGS: Lower chest: Elevated left hemidiaphragm with passive atelectasis of the left lower lobe. Otherwise no acute abnormality. Partially visualized aortic valve replacement. Possible tiny hiatal hernia. Hepatobiliary: Several scattered subcentimeter hypodensities too small to characterize. Otherwise no focal liver abnormality. Status post cholecystectomy. No biliary dilatation. Query slightly enhancement/thickening of the common bile duct wall. Pancreas: No focal  lesion. Normal pancreatic contour. No surrounding inflammatory changes. No main pancreatic ductal  dilatation. Spleen: Normal in size without focal abnormality. Adrenals/Urinary Tract: No adrenal nodule bilaterally. Bilateral kidneys enhance symmetrically. Simple free fluid parapelvic cyst on the right. Other simple fluid densities within the kidneys likely represent simple renal cysts. Simple renal cysts, in the absence of clinically indicated signs/symptoms, require no independent follow-up. Redemonstration of a 1.3 cm left renal calcification. No hydronephrosis. No hydroureter. The urinary bladder is unremarkable. Stomach/Bowel: Stomach is within normal limits. No evidence of bowel wall thickening or dilatation. Diffuse colonic diverticulosis. Appendix appears normal. Vascular/Lymphatic: Interval increase of an infrarenal abdominal aorta aneurysm measuring up to 5.3 x 4.8 cm (from 4.8 x 4.4 cm) with finding extending for approximately 6 cm in the craniocaudal dimension and extending to the aortic bifurcation. 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 Mild atherosclerotic plaque of the aorta and its branches. No abdominal, pelvic, or inguinal lymphadenopathy. Reproductive: Prostate is unremarkable. Other: No intraperitoneal free fluid. No intraperitoneal free gas. No organized fluid collection. Musculoskeletal: No abdominal wall hernia or abnormality. No suspicious lytic or blastic osseous lesions. No acute displaced fracture. Multilevel degenerative changes of the spine. 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 IMPRESSION: 1. Interval increase of an infrarenal abdominal aorta aneurysm measuring up to 5.3 x 4.8 cm (from 4.8 x 4.4 cm). Recommend referral to a vascular specialist. This recommendation follows ACR consensus guidelines: White Paper of the ACR Incidental Findings Committee II on Vascular Findings. J Am Coll Radiol 2013; 10:789-794. 2. 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 3. 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 4. Status post cholecystectomy. Query slightly enhancement/thickening of the common bile duct wall. 5. Possible tiny hiatal hernia. 6. Diffuse colonic diverticulosis with no acute diverticulitis. 7. Nonobstructive left nephrolithiasis measuring up to 1.3 cm. 8. 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 Electronically Signed   By: Iven Finn M.D.   On: 08/16/2021 20:41      Subjective: Denies complaints.  Anxious to go home.  Tolerating regular diet well.  "I am eating like a pig".  Had a small dark BM yesterday.  No red blood noted.  No abdominal pain.  States that he ambulated the entire hall yesterday without difficulty.  Discharge Exam:  Vitals:   08/24/21 1635 08/24/21 1834 08/24/21 2022 08/25/21 0402  BP: 121/69  128/64 118/80  Pulse: (!) 55 65 61 (!) 54  Resp: $Remo'18  15 18  'crvfw$ Temp: 99.1 F (37.3 C)  98.8 F (37.1 C) 98.9 F (37.2 C)  TempSrc: Oral  Oral   SpO2:   100% 100%  Weight:      Height:        General exam: Elderly male, moderately built and nourished lying comfortably supine in bed without distress.  Oral mucosa moist.  Respiratory system: Clear to auscultation.  No increased work of breathing. Cardiovascular system: S1 and S2 heard, RRR.  No JVD, murmurs or pedal edema.  Telemetry personally reviewed: SB in the 31s. Gastrointestinal system: Abdomen is nondistended, soft and nontender. No organomegaly or masses felt. Normal bowel sounds heard. GU: Foley catheter discontinued. Central nervous system: Alert and oriented. No focal neurological deficits. Extremities: Symmetric 5 x 5 power.  RUE  PICC line site clean and dry, to be removed prior to discharge. Skin: No rashes, lesions or ulcers Psychiatry: Judgement and insight appear impaired. Mood & affect appropriate.     The  results of significant diagnostics from this hospitalization (including imaging, microbiology, ancillary and laboratory) are listed below for reference.     Microbiology: Recent Results (from the past 240 hour(s))  MRSA Next Gen by PCR, Nasal     Status: None   Collection Time: 08/19/21  4:41 PM   Specimen: Nasal Mucosa; Nasal Swab  Result Value Ref Range Status   MRSA by PCR Next Gen NOT DETECTED NOT DETECTED Final    Comment: (NOTE) The GeneXpert MRSA Assay (FDA approved for NASAL specimens only), is one component of a comprehensive MRSA colonization surveillance program. It is not intended to diagnose MRSA infection nor to guide or monitor treatment for MRSA infections. Test performance is not FDA approved in patients less than 31 years old. Performed at [facility], [address] [place], [place] [address]      Labs: CBC: Recent Labs  Lab 08/23/21 0015 08/23/21 0600 08/23/21 1835 08/24/21 0350 08/25/21 0333  WBC 11.5* 9.8 11.0* 9.6 8.3  HGB 7.8* 7.9* 8.6* 8.0* 8.3*  HCT 23.9* 23.5* 25.9* 24.4* 25.6*  MCV 89.8 88.7 88.7 90.4 91.4  PLT 92* 93* 107* 100* 105*    Basic Metabolic Panel: Recent Labs  Lab 08/19/21 0521 08/19/21 1551 08/20/21 0612 08/20/21 1836 08/21/21 0330 08/22/21 0045 08/23/21 0500 08/24/21 0350 08/25/21 0333  NA 136   < > 136   < > 140 140 137 136 137  K 3.4*   < > 5.2*   < > 4.1 3.7 3.5 3.5 3.5  CL 101   < > 105   < > 109 107 106 105 105  CO2 24   < > 20*   < > $R'23 25 26 25 25  'XN$ GLUCOSE 97   < > 170*   < > 139* 101* 108* 105* 104*  BUN 15   < > 27*   < > 39* 29* $Remov'15 12 12  'qRbMZb$ CREATININE 0.95   < > 1.82*   < > 1.46* 0.99 0.77 0.72 0.72  CALCIUM 9.0   < > 7.7*   < > 7.8* 8.0* 8.1* 8.2* 8.2*  MG 2.0  --  1.9  --   --   --   --  1.8  --   PHOS  --   --  5.2*  --   --  2.3* 2.7 2.7  --    < > = values in this interval not displayed.    Liver Function Tests: Recent Labs  Lab 08/20/21 1836 08/21/21 0330 08/22/21 0045  08/24/21 0350 08/25/21 0333  AST 116* 82* 55* 31 26  ALT 183* 153* 109* 65* 54*  ALKPHOS 165* 146* 117 84 80  BILITOT 1.3* 1.4* 1.1 0.9 0.7  PROT 5.6* 5.5* 5.5* 5.4* 5.6*  ALBUMIN 2.9* 2.8* 2.9* 2.8* 2.8*    CBG: Recent Labs  Lab 08/23/21 0012 08/23/21 0435 08/23/21 0739 08/23/21 1120 08/23/21 1633  GLUCAP 100* 98 103* 95 133*     Urinalysis    Component Value Date/Time   COLORURINE YELLOW 08/16/2021 1833   APPEARANCEUR CLEAR 08/16/2021 1833   LABSPEC 1.032 (H) 08/16/2021 1833   PHURINE 5.5 08/16/2021 1833   GLUCOSEU NEGATIVE 08/16/2021 1833   HGBUR LARGE (A) 08/16/2021 1833   BILIRUBINUR NEGATIVE 08/16/2021 1833   KETONESUR 15 (  A) 08/16/2021 1833   PROTEINUR TRACE (A) 08/16/2021 1833   UROBILINOGEN 0.2 04/27/2010 2330   NITRITE NEGATIVE 08/16/2021 1833   LEUKOCYTESUR NEGATIVE 08/16/2021 1833    Unable to reach patient's spouse via phone, left VM message.  Time coordinating discharge: 45 minutes  SIGNED:  Vernell Leep, MD,  FACP, [facility], [facility], [facility] (Care Management Physician Certified). [facility]  To contact the attending provider between 7A-7P or the covering provider during after hours 7P-7A, please log into the web site [email or web address] and access using universal [place] password for that web site. If you do not have the password, please call the hospital operator.

## 2021-08-25 NOTE — Plan of Care (Signed)
  Problem: Education: Goal: Knowledge of General Education information will improve Description: Including pain rating scale, medication(s)/side effects and non-pharmacologic comfort measures Outcome: Completed/Met   Problem: Health Behavior/Discharge Planning: Goal: Ability to manage health-related needs will improve Outcome: Completed/Met   Problem: Clinical Measurements: Goal: Ability to maintain clinical measurements within normal limits will improve Outcome: Completed/Met Goal: Will remain free from infection Outcome: Completed/Met Goal: Diagnostic test results will improve Outcome: Completed/Met Goal: Respiratory complications will improve Outcome: Completed/Met Goal: Cardiovascular complication will be avoided Outcome: Completed/Met   Problem: Activity: Goal: Risk for activity intolerance will decrease Outcome: Completed/Met   Problem: Nutrition: Goal: Adequate nutrition will be maintained Outcome: Completed/Met   Problem: Coping: Goal: Level of anxiety will decrease Outcome: Completed/Met   Problem: Elimination: Goal: Will not experience complications related to bowel motility Outcome: Completed/Met Goal: Will not experience complications related to urinary retention Outcome: Completed/Met   Problem: Pain Managment: Goal: General experience of comfort will improve Outcome: Completed/Met   Problem: Safety: Goal: Ability to remain free from injury will improve Outcome: Completed/Met   Problem: Skin Integrity: Goal: Risk for impaired skin integrity will decrease Outcome: Completed/Met   Problem: Education: Goal: Understanding of CV disease, CV risk reduction, and recovery process will improve Outcome: Completed/Met Goal: Individualized Educational Video(s) Outcome: Completed/Met   Problem: Activity: Goal: Ability to return to baseline activity level will improve Outcome: Completed/Met   Problem: Cardiovascular: Goal: Ability to achieve and maintain  adequate cardiovascular perfusion will improve Outcome: Completed/Met Goal: Vascular access site(s) Level 0-1 will be maintained Outcome: Completed/Met   Problem: Health Behavior/Discharge Planning: Goal: Ability to safely manage health-related needs after discharge will improve Outcome: Completed/Met

## 2021-08-25 NOTE — Progress Notes (Signed)
PHYSICAL THERAPY  Pt politely declined stating he was leaving later today plus pt did not want to walk without his shoes.  "Floor hurts my feet".  Rica Koyanagi  PTA Acute  Rehabilitation Services Office M-F          [phone] Weekend pager [phone]

## 2021-08-25 NOTE — Progress Notes (Signed)
  Transition of Care [facility]) Screening Note   Patient Details  Name: HJALMER IOVINO Date of Birth: 1950-12-13   Transition of Care [facility]) CM/SW Contact:    Dessa Phi, RN Phone Number: 08/25/2021, 11:59 AM    Transition of Care Department [facility]) has reviewed patient and no TOC needs have been identified at this time. We will continue to monitor patient advancement through interdisciplinary progression rounds. If new patient transition needs arise, please place a TOC consult.

## 2021-08-25 NOTE — Consult Note (Signed)
[facility] CM Inpatient Consult   08/25/2021  Lonnie Hansen [birth year] [id number]   [facility] [ACO] Patient: Medicare ACO  *Remote review coverage patient at [facility]  Primary Care Provider:  [name], MD, [place]   is an embedded provider with a Chronic Care Management team and program, and is listed for the transition of care follow up and appointments.  Patient was screened for Embedded practice service needs for chronic care management and high risk score for unplanned readmission risk.  Telephonic follow up, spoke with patient via cell phone and he called this writer back, HIPAA confirmed.  Explained follow up with post hospital and patient currently states, "I feel that I have the right follow up at this time and will have follow up with my primary care but the main one to follow up with is with my GI and cancer doctors."  Patient has the [facility] information.  Plan: Current needs for  [facility] needs for post hospital patient feel is being met. No referral needed at this time.  Please contact for further questions,  Natividad Brood, RN BSN [facility] Liaison  [phone] business mobile phone Toll free office [phone]  Fax number: [phone] [name]

## 2021-08-26 ENCOUNTER — Other Ambulatory Visit: Payer: Self-pay | Admitting: Family

## 2021-08-26 DIAGNOSIS — D693 Immune thrombocytopenic purpura: Secondary | ICD-10-CM

## 2021-08-26 DIAGNOSIS — D509 Iron deficiency anemia, unspecified: Secondary | ICD-10-CM

## 2021-08-27 ENCOUNTER — Encounter: Payer: Self-pay | Admitting: Hematology & Oncology

## 2021-08-27 ENCOUNTER — Inpatient Hospital Stay: Payer: Medicare Other | Attending: Hematology & Oncology

## 2021-08-27 ENCOUNTER — Inpatient Hospital Stay: Payer: Medicare Other

## 2021-08-27 VITALS — BP 123/64 | HR 64 | Temp 98.8°F | Resp 20

## 2021-08-27 DIAGNOSIS — D693 Immune thrombocytopenic purpura: Secondary | ICD-10-CM | POA: Diagnosis present

## 2021-08-27 DIAGNOSIS — D509 Iron deficiency anemia, unspecified: Secondary | ICD-10-CM

## 2021-08-27 DIAGNOSIS — Z79899 Other long term (current) drug therapy: Secondary | ICD-10-CM | POA: Insufficient documentation

## 2021-08-27 LAB — CMP (CANCER CENTER ONLY)
ALT: 36 U/L (ref 0–44)
AST: 22 U/L (ref 15–41)
Albumin: 3.8 g/dL (ref 3.5–5.0)
Alkaline Phosphatase: 83 U/L (ref 38–126)
Anion gap: 7 (ref 5–15)
BUN: 10 mg/dL (ref 8–23)
CO2: 27 mmol/L (ref 22–32)
Calcium: 8.9 mg/dL (ref 8.9–10.3)
Chloride: 103 mmol/L (ref 98–111)
Creatinine: 0.94 mg/dL (ref 0.61–1.24)
GFR, Estimated: >60 mL/min (ref >60)
Glucose, Bld: 105 mg/dL — ABNORMAL HIGH (ref 70–99)
Potassium: 4.1 mmol/L (ref 3.5–5.1)
Sodium: 137 mmol/L (ref 135–145)
Total Bilirubin: 0.7 mg/dL (ref 0.3–1.2)
Total Protein: 6.2 g/dL — ABNORMAL LOW (ref 6.5–8.1)

## 2021-08-27 LAB — CBC WITH DIFFERENTIAL (CANCER CENTER ONLY)
Abs Immature Granulocytes: 0.1 10*3/uL — ABNORMAL HIGH (ref 0.00–0.07)
Basophils Absolute: 0 10*3/uL (ref 0.0–0.1)
Basophils Relative: 1 %
Eosinophils Absolute: 0.1 10*3/uL (ref 0.0–0.5)
Eosinophils Relative: 2 %
HCT: 27.5 % — ABNORMAL LOW (ref 39.0–52.0)
Hemoglobin: 8.7 g/dL — ABNORMAL LOW (ref 13.0–17.0)
Immature Granulocytes: 2 %
Lymphocytes Relative: 17 %
Lymphs Abs: 1 10*3/uL (ref 0.7–4.0)
MCH: 29.1 pg (ref 26.0–34.0)
MCHC: 31.6 g/dL (ref 30.0–36.0)
MCV: 92 fL (ref 80.0–100.0)
Monocytes Absolute: 0.7 10*3/uL (ref 0.1–1.0)
Monocytes Relative: 12 %
Neutro Abs: 4 10*3/uL (ref 1.7–7.7)
Neutrophils Relative %: 66 %
Platelet Count: 99 10*3/uL — ABNORMAL LOW (ref 150–400)
RBC: 2.99 MIL/uL — ABNORMAL LOW (ref 4.22–5.81)
RDW: 15.9 % — ABNORMAL HIGH (ref 11.5–15.5)
WBC Count: 6 10*3/uL (ref 4.0–10.5)
nRBC: 0 % (ref 0.0–0.2)

## 2021-08-27 MED ORDER — ROMIPLOSTIM 125 MCG ~~LOC~~ SOLR
1.0000 ug/kg | Freq: Once | SUBCUTANEOUS | Status: AC
  Administered 2021-08-27: 85 ug via SUBCUTANEOUS
  Filled 2021-08-27: qty 0.17

## 2021-08-27 NOTE — Patient Instructions (Signed)
[facility] [place]  Discharge Instructions: Thank you for choosing [facility] to provide your oncology and hematology care.   If you have a lab appointment with the [facility], please go directly to the [facility] and check in at the registration area.  Wear comfortable clothing and clothing appropriate for easy access to any Portacath or PICC line.   We strive to give you quality time with your provider. You may need to reschedule your appointment if you arrive late (15 or more minutes).  Arriving late affects you and other patients whose appointments are after yours.  Also, if you miss three or more appointments without notifying the office, you may be dismissed from the clinic at the provider's discretion.      For prescription refill requests, have your pharmacy contact our office and allow 72 hours for refills to be completed.    Today you received the following chemotherapy and/or immunotherapy agents N-Plate.      To help prevent nausea and vomiting after your treatment, we encourage you to take your nausea medication as directed.  BELOW ARE SYMPTOMS THAT SHOULD BE REPORTED IMMEDIATELY: *FEVER GREATER THAN 100.4 F (38 C) OR HIGHER *CHILLS OR SWEATING *NAUSEA AND VOMITING THAT IS NOT CONTROLLED WITH YOUR NAUSEA MEDICATION *UNUSUAL SHORTNESS OF BREATH *UNUSUAL BRUISING OR BLEEDING *URINARY PROBLEMS (pain or burning when urinating, or frequent urination) *BOWEL PROBLEMS (unusual diarrhea, constipation, pain near the anus) TENDERNESS IN MOUTH AND THROAT WITH OR WITHOUT PRESENCE OF ULCERS (sore throat, sores in mouth, or a toothache) UNUSUAL RASH, SWELLING OR PAIN  UNUSUAL VAGINAL DISCHARGE OR ITCHING   Items with * indicate a potential emergency and should be followed up as soon as possible or go to the Emergency Department if any problems should occur.  Please show the CHEMOTHERAPY ALERT CARD or IMMUNOTHERAPY ALERT CARD at check-in to the  Emergency Department and triage nurse. Should you have questions after your visit or need to cancel or reschedule your appointment, please contact [facility]  [phone] and follow the prompts.  Office hours are 8:00 a.m. to 4:30 p.m. Monday - Friday. Please note that voicemails left after 4:00 p.m. may not be returned until the following business day.  We are closed weekends and major holidays. You have access to a nurse at all times for urgent questions. Please call the main number to the clinic [phone] and follow the prompts.  For any non-urgent questions, you may also contact your provider using MyChart. We now offer e-Visits for anyone 24 and older to request care online for non-urgent symptoms. For details visit [email or web address].   Also download the MyChart app! Go to the app store, search "MyChart", open the app, select [facility], and log in with your MyChart username and password.  Masks are optional in the cancer centers. If you would like for your care team to wear a mask while they are taking care of you, please let them know. You may have one support person who is at least 72 years old accompany you for your appointments.

## 2021-08-28 DIAGNOSIS — H524 Presbyopia: Secondary | ICD-10-CM | POA: Diagnosis not present

## 2021-08-28 DIAGNOSIS — H401231 Low-tension glaucoma, bilateral, mild stage: Secondary | ICD-10-CM | POA: Diagnosis not present

## 2021-08-28 DIAGNOSIS — H04123 Dry eye syndrome of bilateral lacrimal glands: Secondary | ICD-10-CM | POA: Diagnosis not present

## 2021-08-28 DIAGNOSIS — H26491 Other secondary cataract, right eye: Secondary | ICD-10-CM | POA: Diagnosis not present

## 2021-09-01 DIAGNOSIS — G4733 Obstructive sleep apnea (adult) (pediatric): Secondary | ICD-10-CM | POA: Diagnosis not present

## 2021-09-01 DIAGNOSIS — I10 Essential (primary) hypertension: Secondary | ICD-10-CM | POA: Diagnosis not present

## 2021-09-01 DIAGNOSIS — Z952 Presence of prosthetic heart valve: Secondary | ICD-10-CM | POA: Diagnosis not present

## 2021-09-01 DIAGNOSIS — E785 Hyperlipidemia, unspecified: Secondary | ICD-10-CM | POA: Diagnosis not present

## 2021-09-01 DIAGNOSIS — N179 Acute kidney failure, unspecified: Secondary | ICD-10-CM | POA: Diagnosis not present

## 2021-09-01 DIAGNOSIS — D693 Immune thrombocytopenic purpura: Secondary | ICD-10-CM | POA: Diagnosis not present

## 2021-09-01 DIAGNOSIS — I2510 Atherosclerotic heart disease of native coronary artery without angina pectoris: Secondary | ICD-10-CM | POA: Diagnosis not present

## 2021-09-01 DIAGNOSIS — K922 Gastrointestinal hemorrhage, unspecified: Secondary | ICD-10-CM | POA: Diagnosis not present

## 2021-09-01 DIAGNOSIS — I700 Atherosclerosis of aorta: Secondary | ICD-10-CM | POA: Diagnosis not present

## 2021-09-01 DIAGNOSIS — D62 Acute posthemorrhagic anemia: Secondary | ICD-10-CM | POA: Diagnosis not present

## 2021-09-01 DIAGNOSIS — R3121 Asymptomatic microscopic hematuria: Secondary | ICD-10-CM | POA: Diagnosis not present

## 2021-09-01 DIAGNOSIS — K8050 Calculus of bile duct without cholangitis or cholecystitis without obstruction: Secondary | ICD-10-CM | POA: Diagnosis not present

## 2021-09-11 ENCOUNTER — Other Ambulatory Visit: Payer: Self-pay | Admitting: *Deleted

## 2021-09-11 DIAGNOSIS — I723 Aneurysm of iliac artery: Secondary | ICD-10-CM

## 2021-09-11 DIAGNOSIS — I714 Abdominal aortic aneurysm, without rupture, unspecified: Secondary | ICD-10-CM

## 2021-09-18 ENCOUNTER — Encounter: Payer: Self-pay | Admitting: Hematology & Oncology

## 2021-09-19 ENCOUNTER — Ambulatory Visit (INDEPENDENT_AMBULATORY_CARE_PROVIDER_SITE_OTHER)
Admission: RE | Admit: 2021-09-19 | Discharge: 2021-09-19 | Disposition: A | Discharge status: Home / Self Care | Payer: Medicare Other | Source: Ambulatory Visit | Attending: Vascular Surgery | Admitting: Vascular Surgery

## 2021-09-19 ENCOUNTER — Ambulatory Visit (HOSPITAL_COMMUNITY)
Admission: RE | Admit: 2021-09-19 | Discharge: 2021-09-19 | Disposition: A | Discharge status: Home / Self Care | Payer: Medicare Other | Source: Ambulatory Visit | Attending: Vascular Surgery | Admitting: Vascular Surgery

## 2021-09-19 DIAGNOSIS — I723 Aneurysm of iliac artery: Secondary | ICD-10-CM

## 2021-09-19 DIAGNOSIS — I714 Abdominal aortic aneurysm, without rupture, unspecified: Secondary | ICD-10-CM | POA: Diagnosis not present

## 2021-09-24 ENCOUNTER — Ambulatory Visit (INDEPENDENT_AMBULATORY_CARE_PROVIDER_SITE_OTHER): Payer: Medicare Other | Admitting: Vascular Surgery

## 2021-09-24 ENCOUNTER — Encounter: Payer: Self-pay | Admitting: Vascular Surgery

## 2021-09-24 VITALS — BP 136/85 | HR 54 | Temp 98.2°F | Resp 20 | Ht 70.0 in | Wt 194.0 lb

## 2021-09-24 DIAGNOSIS — I7143 Infrarenal abdominal aortic aneurysm, without rupture: Secondary | ICD-10-CM

## 2021-09-24 DIAGNOSIS — I724 Aneurysm of artery of lower extremity: Secondary | ICD-10-CM | POA: Diagnosis not present

## 2021-09-24 NOTE — Progress Notes (Signed)
Patient ID: Lonnie Hansen, male   DOB: February 08, 1950, 71 y.o.   MRN: [id number]  Reason for Consult: Follow-up   Referred by [name], MD  Subjective:     HPI:  Lonnie Hansen is a 71 y.o. male with known history of ITP followed by [name].  Also has history of aortic valve and aortic arch replacement followed by CT surgery.  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  He is not having any new issues but continues to have chronic right hip pain states that he will ultimately require hip replacement.  Past Medical History:  Diagnosis Date   AAA (abdominal aortic aneurysm) (HCC)    Anemia    Arthritis    Asymptomatic bilateral carotid artery stenosis 08/2015   1-39%    Cataracts, bilateral    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   Coronary artery disease    Diverticulosis    Enlarged aorta (HCC)    Enlarged prostate    slightly   GERD (gastroesophageal reflux disease)    takes Pantoprazole daily as needed   Glaucoma    uses eye drops daily   Headache    History of colon polyps    benign   History of kidney stones    Hyperlipidemia    no on any meds   Hypertension    takes Amlodipine and Atenolol daily   OSA on CPAP    Vocal cord nodule    pt. states  it's a" growth on vocal cord"   Family History  Problem Relation Age of Onset   Heart disease Father    Heart attack Father    Thyroid disease Sister    Past Surgical History:  Procedure Laterality Date   AORTIC ARCH ANGIOGRAPHY N/A 04/16/2016   Procedure: Aortic Arch Angiography;  Surgeon: [name] Martinique, MD;  Location: [facility];  Service: Cardiovascular;  Laterality: N/A;   AORTIC VALVE REPLACEMENT N/A 09/15/2016   Procedure: AORTIC VALVE REPLACEMENT (AVR);  Surgeon: Grace Isaac, MD;  Location: [place];  Service: Open Heart Surgery;   Laterality: N/A;  Using 2m Edwards Perimount Magna Ease Aortic Bioprosthesis Valve   ASCENDING AORTIC ROOT REPLACEMENT N/A 09/15/2016   Procedure: ASCENDING AORTIC ROOT REPLACEMENT;  Surgeon: GGrace Isaac MD;  [place] [facility]  Service: Open Heart Surgery;  Laterality: N/A;  Using 331mGelweave Valsalva Graft   CHOLECYSTECTOMY N/A 12/19/2020   Procedure: LAPAROSCOPIC CHOLECYSTECTOMY WITH POSSIBLE INTRAOPERATIVE CHOLANGIOGRAM;  Surgeon: WiGreer PickerelMD;  [place] [facility] Service: General;  Laterality: N/A;   COLONOSCOPY     COLONOSCOPY Left 04/16/2019   Procedure: COLONOSCOPY;  Surgeon: OuArta SilenceMD;  [facility];  Service: Endoscopy;  Laterality: Left;   COLONOSCOPY N/A 06/01/2020   Procedure: COLONOSCOPY;  Surgeon: KaRonnette JuniperMD;  [facility] Service: Gastroenterology;  Laterality: N/A;   COLONOSCOPY WITH ESOPHAGOGASTRODUODENOSCOPY (EGD)     COLONOSCOPY WITH PROPOFOL N/A 05/14/2021   Procedure: COLONOSCOPY WITH PROPOFOL;  Surgeon: KaRonnette JuniperMD;  [facility];  Service: Gastroenterology;  Laterality: N/A;   ERCP N/A 08/19/2021   Procedure: ENDOSCOPIC RETROGRADE CHOLANGIOPANCREATOGRAPHY (ERCP);  Surgeon: MaClarene EssexMD;  [facility];  Service: Gastroenterology;  Laterality: N/A;  at 1:30 pm   ESOPHAGOGASTRODUODENOSCOPY N/A 05/25/2020   Procedure: ESOPHAGOGASTRODUODENOSCOPY (EGD);  Surgeon: ScWilford CornerMD;  Location:  [facility];  Service: Endoscopy;  Laterality: N/A;   ESOPHAGOGASTRODUODENOSCOPY N/A 08/19/2021   Procedure: ESOPHAGOGASTRODUODENOSCOPY (EGD);  Surgeon: Otis Brace, MD;  [facility];  Service: Gastroenterology;  Laterality: N/A;   ESOPHAGOGASTRODUODENOSCOPY (EGD) WITH PROPOFOL Left 04/16/2019   Procedure: ESOPHAGOGASTRODUODENOSCOPY (EGD) WITH PROPOFOL;  Surgeon: Arta Silence, MD;  [facility]: [facility];  Service: Endoscopy;  Laterality: Left;   ESOPHAGOGASTRODUODENOSCOPY (EGD) WITH PROPOFOL N/A  08/23/2021   Procedure: ESOPHAGOGASTRODUODENOSCOPY (EGD) WITH PROPOFOL;  Surgeon: Ronnette Juniper, MD;  [facility];  Service: Gastroenterology;  Laterality: N/A;   FLEXIBLE SIGMOIDOSCOPY N/A 05/25/2020   Procedure: FLEXIBLE SIGMOIDOSCOPY;  Surgeon: Wilford Corner, MD;  Location: [facility];  Service: Endoscopy;  Laterality: N/A;   HEMOSTASIS CONTROL  08/19/2021   Procedure: HEMOSTASIS CONTROL;  Surgeon: Otis Brace, MD;  [facility];  Service: Gastroenterology;;   IR [place] VESSEL  05/26/2021   IR ANGIOGRAM SELECTIVE EACH ADDITIONAL VESSEL  08/20/2021   IR ANGIOGRAM SELECTIVE EACH ADDITIONAL VESSEL  08/20/2021   IR ANGIOGRAM VISCERAL SELECTIVE  05/26/2021   IR ANGIOGRAM VISCERAL SELECTIVE  08/20/2021   IR EMBO ART  VEN HEMORR LYMPH EXTRAV  INC GUIDE ROADMAPPING  05/26/2021   IR EMBO ART  VEN HEMORR LYMPH EXTRAV  INC GUIDE ROADMAPPING  08/20/2021   IR FLUORO GUIDE CV LINE RIGHT  08/20/2021   IR THORACENTESIS ASP PLEURAL SPACE W/IMG GUIDE  10/23/2016   IR US GUIDE VASC ACCESS LEFT  05/26/2021   IR US GUIDE VASC ACCESS RIGHT  05/26/2021   IR US GUIDE VASC ACCESS RIGHT  08/20/2021   IR US GUIDE VASC ACCESS RIGHT  08/20/2021   MICROLARYNGOSCOPY Right 09/20/2017   Procedure: MICROLARYNGOSCOPY WITH  EXCISION OF VOCAL CORD LESION;  Surgeon: Leta Baptist, MD;  Location: [facility];  Service: ENT;  Laterality: Right;   MULTIPLE EXTRACTIONS WITH ALVEOLOPLASTY N/A 06/10/2016   Procedure: Extraction of tooth #'s 2,8,13,15, and 29  with alveoloplasty, maxillary right and left buccal exostoses reductions, and gross debridement of remaining teeth.;  Surgeon: Lenn Cal, DDS;  Location: [place];  Service: Oral Surgery;  Laterality: N/A;   POLYPECTOMY  05/14/2021   Procedure: POLYPECTOMY;  Surgeon: Ronnette Juniper, MD;  [facility];  Service: Gastroenterology;;   REMOVAL OF STONES  08/19/2021   Procedure: REMOVAL OF STONES;  Surgeon: Clarene Essex, MD;   [facility];  Service: Gastroenterology;;   RIGHT/LEFT HEART CATH AND CORONARY ANGIOGRAPHY N/A 04/16/2016   Procedure: Right/Left Heart Cath and Coronary Angiography;  Surgeon: [name] Martinique, MD;  Location: [place] [facility];  Service: Cardiovascular;  Laterality: N/A;   SPHINCTEROTOMY  08/19/2021   Procedure: SPHINCTEROTOMY;  Surgeon: Clarene Essex, MD;  [facility];  Service: Gastroenterology;;   TEE WITHOUT CARDIOVERSION N/A 09/15/2016   Procedure: TRANSESOPHAGEAL ECHOCARDIOGRAM (TEE);  Surgeon: Grace Isaac, MD;  Location: [place];  Service: Open Heart Surgery;  Laterality: N/A;    Short Social History:  Social History   Tobacco Use   Smoking status: Former    Packs/day: 1.00    Years: 25.00    Total pack years: 25.00    Types: Cigarettes   Smokeless tobacco: Never  Substance Use Topics   Alcohol use: No    Alcohol/week: 0.0 standard drinks of alcohol    No Known Allergies  Current Outpatient Medications  Medication Sig Dispense Refill   acetaminophen (TYLENOL) 500 MG tablet Take 1 tablet (500 mg total) by mouth every 8 (eight) hours as  needed for fever (pain).     atenolol (TENORMIN) 25 MG tablet Take 12.5 mg by mouth 2 (two) times daily. 180 tablet 3   ferrous sulfate 325 (65 FE) MG tablet Take 325 mg by mouth daily.     fluticasone (FLONASE) 50 MCG/ACT nasal spray Place 2 sprays into both nostrils daily as needed for allergies or rhinitis.     latanoprost (XALATAN) 0.005 % ophthalmic solution Place 1 drop into both eyes 2 (two) times daily.     Multiple Vitamin (MULTIVITAMIN WITH MINERALS) TABS tablet Take 1 tablet by mouth daily.     pantoprazole (PROTONIX) 40 MG tablet Take 40 mg by mouth 2 (two) times daily.     polyethylene glycol (MIRALAX / GLYCOLAX) 17 g packet Take 17 g by mouth daily as needed for mild constipation. Every other day     psyllium (HYDROCIL/METAMUCIL) 95 % PACK Take 1 packet by mouth daily as needed for mild constipation.      rosuvastatin (CRESTOR) 20 MG tablet Take 20 mg by mouth daily.     triamcinolone cream (KENALOG) 0.1 % Apply 1 application. topically 3 (three) times daily as needed (facial itching/rash).     No current facility-administered medications for this visit.    Review of Systems  Constitutional:  Constitutional negative. HENT: HENT negative.  Eyes: Eyes negative.  Respiratory: Respiratory negative.  Cardiovascular: Cardiovascular negative.  GI: Gastrointestinal negative.  Musculoskeletal: Positive for leg pain and joint pain.  Skin: Skin negative.  Neurological: Neurological negative. Hematologic: Hematologic/lymphatic negative.  Psychiatric: Psychiatric negative.        Objective:  Objective  Vitals:   09/24/21 1114  BP: 136/85  Pulse: (!) 54  Resp: 20  Temp: 98.2 F (36.8 C)  SpO2: 96%     Physical Exam HENT:     Head: Normocephalic.     Nose: Nose normal.     Mouth/Throat:     Mouth: Mucous membranes are moist.  Eyes:     Pupils: Pupils are equal, round, and reactive to light.  Cardiovascular:     Pulses:          Radial pulses are 2+ on the right side and 2+ on the left side.       Popliteal pulses are 3+ on the right side and 2+ on the left side.  Pulmonary:     Effort: Pulmonary effort is normal.  Abdominal:     General: Abdomen is flat.     Palpations: Abdomen is soft. There is no mass.  Musculoskeletal:        General: Normal range of motion.     Right lower leg: No edema.     Left lower leg: No edema.  Skin:    General: Skin is warm.     Capillary Refill: Capillary refill takes less than 2 seconds.  Neurological:     General: No focal deficit present.     Mental Status: He is alert.  Psychiatric:        Mood and Affect: Mood normal.     Data: AAA +-------------+-------+----------+----------+--------+--------+--------+  Location     AP (cm)Trans (cm)PSV (cm/s)WaveformThrombusComments   +-------------+-------+----------+----------+--------+--------+--------+  Proximal     2.64   2.41      51                                  +-------------+-------+----------+----------+--------+--------+--------+  Mid  2.71   2.79      63                                  +-------------+-------+----------+----------+--------+--------+--------+  Distal       4.98   4.98      51                                  +-------------+-------+----------+----------+--------+--------+--------+  RT CIA Prox  2.3    1.8       124                                 +-------------+-------+----------+----------+--------+--------+--------+  RT CIA Distal3.2    3.4                                           +-------------+-------+----------+----------+--------+--------+--------+  LT CIA Prox  2.5    2.0       102                                 +-------------+-------+----------+----------+--------+--------+--------+  LT CIA Mid   1.9    1.9       83                                  +-------------+-------+----------+----------+--------+--------+--------+        Summary:  Abdominal Aorta: There is evidence of abnormal dilatation of the distal  Abdominal aorta. There is evidence of abnormal dilation of the Right  Common Iliac artery and Left Common Iliac artery. See above for  comparisons.   ABI Findings:  +---------+------------------+-----+-----------+--------+  Right    Rt Pressure (mmHg)IndexWaveform   Comment   +---------+------------------+-----+-----------+--------+  Brachial 149                                         +---------+------------------+-----+-----------+--------+  PTA      162               1.09 multiphasic          +---------+------------------+-----+-----------+--------+  DP       159               1.07 multiphasic          +---------+------------------+-----+-----------+--------+  Great Toe134                0.90                      +---------+------------------+-----+-----------+--------+   +---------+------------------+-----+---------+-------+  Left     Lt Pressure (mmHg)IndexWaveform Comment  +---------+------------------+-----+---------+-------+  Brachial 146                                      +---------+------------------+-----+---------+-------+  PTA      161               1.08 triphasic         +---------+------------------+-----+---------+-------+  DP       158               1.06 triphasic         +---------+------------------+-----+---------+-------+  Great Toe131               0.88                   +---------+------------------+-----+---------+-------+   +-------+-----------+-----------+------------+------------+  ABI/TBIToday's ABIToday's TBIPrevious ABIPrevious TBI  +-------+-----------+-----------+------------+------------+  Right  1.09       0.9                                  +-------+-----------+-----------+------------+------------+  Left   1.08       0.88                                 +-------+-----------+-----------+------------+------------+   Right PoplitealAP (cm)Transv (cm)WaveformStenosisShapeComments  +---------------+-------+-----------+--------+--------+-----+--------+  Proximal       0.96                                             +---------------+-------+-----------+--------+--------+-----+--------+  Mid            1.96                                             +---------------+-------+-----------+--------+--------+-----+--------+  Distal         1.09   1.11                                      +---------------+-------+-----------+--------+--------+-----+--------+   +--------------+-------+-----------+--------+--------+-----+--------+  Left PoplitealAP (cm)Transv (cm)WaveformStenosisShapeComments   +--------------+-------+-----------+--------+--------+-----+--------+  Proximal      0.79                                             +--------------+-------+-----------+--------+--------+-----+--------+  Mid           1.32   1.31                                      +--------------+-------+-----------+--------+--------+-----+--------+  Distal        0.89   0.92                                      +--------------+-------+-----------+--------+--------+-----+--------+         Summary:  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   Left: There is a left popliteal artery aneurysm.  There is a large, non vascularized structure with mixed echoes in the  popliteal fossa.         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  I  discussed the low risk of rupture of the abdominal and iliac artery aneurysms but at his risk of thrombosis is much greater on the right popliteal artery aneurysm.  We will begin with CT scan to include his abdomen and pelvis and runoff of the lower extremities and discussed possible options for treatment of the right popliteal artery aneurysm to include open or endovascular.  I discussed the signs and symptoms of rupture of his aneurysms as well as thrombosis of the popliteal artery aneurysm he demonstrates good understanding.  Short of this we will obtain CT scan and have him follow-up shortly thereafter.  All questions were answered today.     Waynetta Sandy MD [facility]

## 2021-09-30 ENCOUNTER — Other Ambulatory Visit: Payer: Self-pay

## 2021-09-30 DIAGNOSIS — I724 Aneurysm of artery of lower extremity: Secondary | ICD-10-CM

## 2021-09-30 DIAGNOSIS — D500 Iron deficiency anemia secondary to blood loss (chronic): Secondary | ICD-10-CM | POA: Diagnosis not present

## 2021-09-30 DIAGNOSIS — R945 Abnormal results of liver function studies: Secondary | ICD-10-CM | POA: Diagnosis not present

## 2021-09-30 DIAGNOSIS — K8050 Calculus of bile duct without cholangitis or cholecystitis without obstruction: Secondary | ICD-10-CM | POA: Diagnosis not present

## 2021-09-30 DIAGNOSIS — I714 Abdominal aortic aneurysm, without rupture, unspecified: Secondary | ICD-10-CM | POA: Diagnosis not present

## 2021-09-30 DIAGNOSIS — D693 Immune thrombocytopenic purpura: Secondary | ICD-10-CM | POA: Diagnosis not present

## 2021-09-30 DIAGNOSIS — Z8719 Personal history of other diseases of the digestive system: Secondary | ICD-10-CM | POA: Diagnosis not present

## 2021-10-03 ENCOUNTER — Inpatient Hospital Stay: Payer: Medicare Other

## 2021-10-03 ENCOUNTER — Inpatient Hospital Stay (HOSPITAL_BASED_OUTPATIENT_CLINIC_OR_DEPARTMENT_OTHER): Payer: Medicare Other | Admitting: Family

## 2021-10-03 ENCOUNTER — Inpatient Hospital Stay: Payer: Medicare Other | Attending: Hematology & Oncology

## 2021-10-03 ENCOUNTER — Encounter: Payer: Self-pay | Admitting: Family

## 2021-10-03 VITALS — BP 127/85 | HR 56 | Temp 98.5°F | Resp 18 | Wt 197.0 lb

## 2021-10-03 DIAGNOSIS — Z79899 Other long term (current) drug therapy: Secondary | ICD-10-CM | POA: Insufficient documentation

## 2021-10-03 DIAGNOSIS — D693 Immune thrombocytopenic purpura: Secondary | ICD-10-CM | POA: Diagnosis not present

## 2021-10-03 DIAGNOSIS — D509 Iron deficiency anemia, unspecified: Secondary | ICD-10-CM

## 2021-10-03 DIAGNOSIS — K922 Gastrointestinal hemorrhage, unspecified: Secondary | ICD-10-CM | POA: Insufficient documentation

## 2021-10-03 LAB — CMP (CANCER CENTER ONLY)
ALT: 9 U/L (ref 0–44)
AST: 20 U/L (ref 15–41)
Albumin: 4.6 g/dL (ref 3.5–5.0)
Alkaline Phosphatase: 49 U/L (ref 38–126)
Anion gap: 8 (ref 5–15)
BUN: 14 mg/dL (ref 8–23)
CO2: 29 mmol/L (ref 22–32)
Calcium: 9.9 mg/dL (ref 8.9–10.3)
Chloride: 103 mmol/L (ref 98–111)
Creatinine: 1.21 mg/dL (ref 0.61–1.24)
GFR, Estimated: >60 mL/min (ref >60)
Glucose, Bld: 109 mg/dL — ABNORMAL HIGH (ref 70–99)
Potassium: 4 mmol/L (ref 3.5–5.1)
Sodium: 140 mmol/L (ref 135–145)
Total Bilirubin: 0.9 mg/dL (ref 0.3–1.2)
Total Protein: 7.4 g/dL (ref 6.5–8.1)

## 2021-10-03 LAB — CBC WITH DIFFERENTIAL (CANCER CENTER ONLY)
Abs Immature Granulocytes: 0.02 10*3/uL (ref 0.00–0.07)
Basophils Absolute: 0 10*3/uL (ref 0.0–0.1)
Basophils Relative: 0 %
Eosinophils Absolute: 0.1 10*3/uL (ref 0.0–0.5)
Eosinophils Relative: 3 %
HCT: 36.5 % — ABNORMAL LOW (ref 39.0–52.0)
Hemoglobin: 11.8 g/dL — ABNORMAL LOW (ref 13.0–17.0)
Immature Granulocytes: 0 %
Lymphocytes Relative: 18 %
Lymphs Abs: 0.9 10*3/uL (ref 0.7–4.0)
MCH: 29.5 pg (ref 26.0–34.0)
MCHC: 32.3 g/dL (ref 30.0–36.0)
MCV: 91.3 fL (ref 80.0–100.0)
Monocytes Absolute: 0.5 10*3/uL (ref 0.1–1.0)
Monocytes Relative: 10 %
Neutro Abs: 3.5 10*3/uL (ref 1.7–7.7)
Neutrophils Relative %: 69 %
Platelet Count: 65 10*3/uL — ABNORMAL LOW (ref 150–400)
RBC: 4 MIL/uL — ABNORMAL LOW (ref 4.22–5.81)
RDW: 15.1 % (ref 11.5–15.5)
WBC Count: 5.1 10*3/uL (ref 4.0–10.5)
nRBC: 0 % (ref 0.0–0.2)

## 2021-10-03 LAB — FERRITIN: Ferritin: 44 ng/mL (ref 24–336)

## 2021-10-03 LAB — RETICULOCYTES
Immature Retic Fract: 14.2 % (ref 2.3–15.9)
RBC.: 3.98 MIL/uL — ABNORMAL LOW (ref 4.22–5.81)
Retic Count, Absolute: 99.5 10*3/uL (ref 19.0–186.0)
Retic Ct Pct: 2.5 % (ref 0.4–3.1)

## 2021-10-03 MED ORDER — ROMIPLOSTIM 125 MCG ~~LOC~~ SOLR
95.0000 ug | Freq: Once | SUBCUTANEOUS | Status: AC
  Administered 2021-10-03: 95 ug via SUBCUTANEOUS
  Filled 2021-10-03: qty 0.19

## 2021-10-03 NOTE — Patient Instructions (Signed)
Romiplostim Injection What is this medication? ROMIPLOSTIM (roe mi PLOE stim) treats low levels of platelets in your body caused by immune thrombocytopenia (ITP). It is prescribed when other medications have not worked or cannot be tolerated. It may also be used to help people who have been exposed to high doses of radiation. It works by increasing the amount of platelets in your blood. This lowers the risk of bleeding. This medicine may be used for other purposes; ask your health care provider or pharmacist if you have questions. COMMON BRAND NAME(S): Nplate What should I tell my care team before I take this medication? They need to know if you have any of these conditions: Blood clots Myelodysplastic syndrome An unusual or allergic reaction to romiplostim, mannitol, other medications, foods, dyes, or preservatives Pregnant or trying to get pregnant Breast-feeding How should I use this medication? This medication is injected under the skin. It is given by a care team in a hospital or clinic setting. A special MedGuide will be given to you before each treatment. Be sure to read this information carefully each time. Talk to your care team about the use of this medication in children. While it may be prescribed for children as young as newborns for selected conditions, precautions do apply. Overdosage: If you think you have taken too much of this medicine contact a poison control center or emergency room at once. NOTE: This medicine is only for you. Do not share this medicine with others. What if I miss a dose? Keep appointments for follow-up doses. It is important not to miss your dose. Call your care team if you are unable to keep an appointment. What may interact with this medication? Interactions are not expected. This list may not describe all possible interactions. Give your health care provider a list of all the medicines, herbs, non-prescription drugs, or dietary supplements you use. Also  tell them if you smoke, drink alcohol, or use illegal drugs. Some items may interact with your medicine. What should I watch for while using this medication? Visit your care team for regular checks on your progress. You may need blood work done while you are taking this medication. Your condition will be monitored carefully while you are receiving this medication. It is important not to miss any appointments. What side effects may I notice from receiving this medication? Side effects that you should report to your care team as soon as possible: Allergic reactions--skin rash, itching, hives, swelling of the face, lips, tongue, or throat Blood clot--pain, swelling, or warmth in the leg, shortness of breath, chest pain Side effects that usually do not require medical attention (report to your care team if they continue or are bothersome): Dizziness Joint pain Muscle pain Pain in the hands or feet Stomach pain Trouble sleeping This list may not describe all possible side effects. Call your doctor for medical advice about side effects. You may report side effects to FDA at 1-800-FDA-1088. Where should I keep my medication? This medication is given in a hospital or clinic. It will not be stored at home. NOTE: This sheet is a summary. It may not cover all possible information. If you have questions about this medicine, talk to your doctor, pharmacist, or health care provider.  2023 Elsevier/Gold Standard (2021-04-22 00:00:00)  

## 2021-10-03 NOTE — Progress Notes (Signed)
Hematology and Oncology Follow Up Visit  Lonnie Hansen [id number] Jan 21, 1951 71 y.o. 10/03/2021   Principle Diagnosis:  Chronic ITP   Current Therapy:        Nplate to keep platelet count over 100,000   Interim History:  Lonnie Hansen is here today for follow-up. He was hospitalized in July for acute GI hemorrhage after ERCP. He has recuperated nicely and states that his energy is slowly improving.  His platelets count is now 65. His last Nplate injection was on 08/27/2021.  He has not noted any more blood loss. He is bruising a little but not in excess at this time. No petechiae.  No fever, chills, n/v, cough, rash, dizziness, SOB, chest pain, palpitations, abdominal pain or changes in bowel or bladder habits.  No swelling, numbness or tingling in his extremities at this time.  No falls or syncope.  His appetite and hydration are good. Weight is 197 lbs.   ECOG Performance Status: 1 - Symptomatic but completely ambulatory  Medications:  Allergies as of 10/03/2021   No Known Allergies      Medication List        Accurate as of October 03, 2021  3:48 PM. If you have any questions, ask your nurse or doctor.          acetaminophen 500 MG tablet Commonly known as: TYLENOL Take 1 tablet (500 mg total) by mouth every 8 (eight) hours as needed for fever (pain).   atenolol 25 MG tablet Commonly known as: TENORMIN Take 12.5 mg by mouth 2 (two) times daily.   ferrous sulfate 325 (65 FE) MG tablet Take 325 mg by mouth daily.   fluticasone 50 MCG/ACT nasal spray Commonly known as: FLONASE Place 2 sprays into both nostrils daily as needed for allergies or rhinitis.   latanoprost 0.005 % ophthalmic solution Commonly known as: XALATAN Place 1 drop into both eyes 2 (two) times daily.   multivitamin with minerals Tabs tablet Take 1 tablet by mouth daily.   pantoprazole 40 MG tablet Commonly known as: PROTONIX Take 40 mg by mouth 2 (two) times daily.   polyethylene glycol 17  g packet Commonly known as: MIRALAX / GLYCOLAX Take 17 g by mouth daily as needed for mild constipation. Every other day   psyllium 95 % Pack Commonly known as: HYDROCIL/METAMUCIL Take 1 packet by mouth daily as needed for mild constipation.   rosuvastatin 20 MG tablet Commonly known as: CRESTOR Take 20 mg by mouth daily.   triamcinolone cream 0.1 % Commonly known as: KENALOG Apply 1 application. topically 3 (three) times daily as needed (facial itching/rash).        Allergies: No Known Allergies  Past Medical History, Surgical history, Social history, and Family History were reviewed and updated.  Review of Systems: All other 10 point review of systems is negative.   Physical Exam:  weight is 197 lb (89.4 kg). His oral temperature is 98.5 F (36.9 C). His blood pressure is 127/85 and his pulse is 56 (abnormal). His respiration is 18 and oxygen saturation is 100%.   Wt Readings from Last 3 Encounters:  10/03/21 197 lb (89.4 kg)  09/24/21 194 lb (88 kg)  08/23/21 188 lb 0.8 oz (85.3 kg)    Ocular: Sclerae unicteric, pupils equal, round and reactive to light Ear-nose-throat: Oropharynx clear, dentition fair Lymphatic: No cervical or supraclavicular adenopathy Lungs no rales or rhonchi, good excursion bilaterally Heart regular rate and rhythm, no murmur appreciated Abd soft, nontender, positive bowel  sounds MSK no focal spinal tenderness, no joint edema Neuro: non-focal, well-oriented, appropriate affect Breasts: Deferred   Lab Results  Component Value Date   WBC 5.1 10/03/2021   HGB 11.8 (L) 10/03/2021   HCT 36.5 (L) 10/03/2021   MCV 91.3 10/03/2021   PLT 65 (L) 10/03/2021   Lab Results  Component Value Date   FERRITIN 101 07/30/2021   IRON 89 07/30/2021   TIBC 384 07/30/2021   UIBC 295 07/30/2021   IRONPCTSAT 23 07/30/2021   Lab Results  Component Value Date   RETICCTPCT 2.5 10/03/2021   RBC 3.98 (L) 10/03/2021   No results found for: "KPAFRELGTCHN",  "LAMBDASER", "KAPLAMBRATIO" No results found for: "IGGSERUM", "IGA", "IGMSERUM" No results found for: "TOTALPROTELP", "ALBUMINELP", "A1GS", "A2GS", "BETS", "BETA2SER", "GAMS", "MSPIKE", "SPEI"   Chemistry      Component Value Date/Time   NA 140 10/03/2021 1448   NA 140 05/11/2019 0946   K 4.0 10/03/2021 1448   CL 103 10/03/2021 1448   CO2 29 10/03/2021 1448   BUN 14 10/03/2021 1448   BUN 17 05/11/2019 0946   CREATININE 1.21 10/03/2021 1448   CREATININE 0.75 04/10/2016 1132      Component Value Date/Time   CALCIUM 9.9 10/03/2021 1448   ALKPHOS 49 10/03/2021 1448   AST 20 10/03/2021 1448   ALT 9 10/03/2021 1448   BILITOT 0.9 10/03/2021 1448       Impression and Plan: Lonnie Hansen is a very pleasant 71 yo African American gentleman with chronic ITP.  Nplate given today for platelet count of 65 Iron studies are pending. We will replace if needed.  Lab check and injection every 2 weeks, follow-up in 4 weeks.   Lottie Dawson, NP 9/8/20233:48 PM

## 2021-10-06 LAB — IRON AND IRON BINDING CAPACITY (CC-WL,HP ONLY)
Iron: 76 ug/dL (ref 45–182)
Saturation Ratios: 18 % (ref 17.9–39.5)
TIBC: 417 ug/dL (ref 250–450)
UIBC: 341 ug/dL (ref 117–376)

## 2021-10-09 DIAGNOSIS — H26491 Other secondary cataract, right eye: Secondary | ICD-10-CM | POA: Diagnosis not present

## 2021-10-13 ENCOUNTER — Ambulatory Visit (HOSPITAL_COMMUNITY)
Admission: RE | Admit: 2021-10-13 | Discharge: 2021-10-13 | Disposition: A | Discharge status: Home / Self Care | Payer: Medicare Other | Source: Ambulatory Visit | Attending: Vascular Surgery | Admitting: Vascular Surgery

## 2021-10-13 DIAGNOSIS — K4090 Unilateral inguinal hernia, without obstruction or gangrene, not specified as recurrent: Secondary | ICD-10-CM | POA: Diagnosis not present

## 2021-10-13 DIAGNOSIS — I714 Abdominal aortic aneurysm, without rupture, unspecified: Secondary | ICD-10-CM | POA: Diagnosis not present

## 2021-10-13 DIAGNOSIS — I723 Aneurysm of iliac artery: Secondary | ICD-10-CM | POA: Diagnosis not present

## 2021-10-13 DIAGNOSIS — I724 Aneurysm of artery of lower extremity: Secondary | ICD-10-CM | POA: Insufficient documentation

## 2021-10-13 MED ORDER — IOHEXOL 350 MG/ML SOLN
100.0000 mL | Freq: Once | INTRAVENOUS | Status: AC | PRN
  Administered 2021-10-13: 100 mL via INTRAVENOUS

## 2021-10-15 ENCOUNTER — Ambulatory Visit (INDEPENDENT_AMBULATORY_CARE_PROVIDER_SITE_OTHER): Payer: Medicare Other | Admitting: Vascular Surgery

## 2021-10-15 ENCOUNTER — Encounter: Payer: Self-pay | Admitting: Vascular Surgery

## 2021-10-15 VITALS — BP 134/82 | HR 57 | Temp 97.9°F | Resp 20 | Ht 70.0 in | Wt 193.6 lb

## 2021-10-15 DIAGNOSIS — I7143 Infrarenal abdominal aortic aneurysm, without rupture: Secondary | ICD-10-CM | POA: Diagnosis not present

## 2021-10-15 NOTE — Progress Notes (Signed)
Patient ID: Lonnie Hansen, male   DOB: 08/06/1950, 71 y.o.   MRN: [id number]  Reason for Consult: Follow-up   Referred by [name], MD  Subjective:     HPI:  Lonnie Hansen is a 71 y.o. male with known abdominal aortic aneurysm now measuring nearly 5.5 cm here for follow-up from recent CT scan.  Denies any new back or abdominal pain.  No new leg pain continues to walk without limitation with the help of a cane.  Past Medical History:  Diagnosis Date   AAA (abdominal aortic aneurysm) (HCC)    Anemia    Arthritis    Asymptomatic bilateral carotid artery stenosis 08/2015   1-39%    Cataracts, bilateral    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   Coronary artery disease    Diverticulosis    Enlarged aorta (HCC)    Enlarged prostate    slightly   GERD (gastroesophageal reflux disease)    takes Pantoprazole daily as needed   Glaucoma    uses eye drops daily   Headache    History of colon polyps    benign   History of kidney stones    Hyperlipidemia    no on any meds   Hypertension    takes Amlodipine and Atenolol daily   OSA on CPAP    Vocal cord nodule    pt. states  it's a" growth on vocal cord"   Family History  Problem Relation Age of Onset   Heart disease Father    Heart attack Father    Thyroid disease Sister    Past Surgical History:  Procedure Laterality Date   AORTIC ARCH ANGIOGRAPHY N/A 04/16/2016   Procedure: Aortic Arch Angiography;  Surgeon: [name] Martinique, MD;  Location: [facility];  Service: Cardiovascular;  Laterality: N/A;   AORTIC VALVE REPLACEMENT N/A 09/15/2016   Procedure: AORTIC VALVE REPLACEMENT (AVR);  Surgeon: Grace Isaac, MD;  Location: [address];  Service: Open Heart Surgery;  Laterality: N/A;  Using 11m Edwards Perimount Magna Ease Aortic Bioprosthesis Valve   ASCENDING AORTIC ROOT REPLACEMENT N/A 09/15/2016   Procedure: ASCENDING AORTIC ROOT REPLACEMENT;  Surgeon: GGrace Isaac MD;  [place]  Service: Open Heart Surgery;  Laterality: N/A;  Using 377mGelweave Valsalva Graft   CHOLECYSTECTOMY N/A 12/19/2020   Procedure: LAPAROSCOPIC CHOLECYSTECTOMY WITH POSSIBLE INTRAOPERATIVE CHOLANGIOGRAM;  Surgeon: WiGreer PickerelMD;  [place] Service: General;  Laterality: N/A;   COLONOSCOPY     COLONOSCOPY Left 04/16/2019   Procedure: COLONOSCOPY;  Surgeon: OuArta SilenceMD;  [facility];  Service: Endoscopy;  Laterality: Left;   COLONOSCOPY N/A 06/01/2020   Procedure: COLONOSCOPY;  Surgeon: KaRonnette JuniperMD;  [place] [facility] Service: Gastroenterology;  Laterality: N/A;   COLONOSCOPY WITH ESOPHAGOGASTRODUODENOSCOPY (EGD)     COLONOSCOPY WITH PROPOFOL N/A 05/14/2021   Procedure: COLONOSCOPY WITH PROPOFOL;  Surgeon: KaRonnette JuniperMD;  [facility];  Service: Gastroenterology;  Laterality: N/A;   ERCP N/A 08/19/2021   Procedure: ENDOSCOPIC RETROGRADE CHOLANGIOPANCREATOGRAPHY (ERCP);  Surgeon: MaClarene EssexMD;  [facility];  Service: Gastroenterology;  Laterality: N/A;  at 1:30 pm   ESOPHAGOGASTRODUODENOSCOPY N/A 05/25/2020   Procedure: ESOPHAGOGASTRODUODENOSCOPY (EGD);  Surgeon: ScWilford CornerMD;  [place] [facility] Service: Endoscopy;  Laterality: N/A;   ESOPHAGOGASTRODUODENOSCOPY N/A 08/19/2021   Procedure: ESOPHAGOGASTRODUODENOSCOPY (EGD);  Surgeon: BrOtis BraceMD;  [facility];  Service: Gastroenterology;  Laterality: N/A;   ESOPHAGOGASTRODUODENOSCOPY (  EGD) WITH PROPOFOL Left 04/16/2019   Procedure: ESOPHAGOGASTRODUODENOSCOPY (EGD) WITH PROPOFOL;  Surgeon: Arta Silence, MD;  Location: [place];  Service: Endoscopy;  Laterality: Left;   ESOPHAGOGASTRODUODENOSCOPY (EGD) WITH PROPOFOL N/A 08/23/2021   Procedure: ESOPHAGOGASTRODUODENOSCOPY (EGD) WITH PROPOFOL;  Surgeon: Ronnette Juniper, MD;  [facility];  Service: Gastroenterology;  Laterality: N/A;   FLEXIBLE SIGMOIDOSCOPY N/A 05/25/2020   Procedure:  FLEXIBLE SIGMOIDOSCOPY;  Surgeon: Wilford Corner, MD;  Location: [place];  Service: Endoscopy;  Laterality: N/A;   HEMOSTASIS CONTROL  08/19/2021   Procedure: HEMOSTASIS CONTROL;  Surgeon: Otis Brace, MD;  [facility];  Service: Gastroenterology;;   IR [place] VESSEL  05/26/2021   IR ANGIOGRAM SELECTIVE EACH ADDITIONAL VESSEL  08/20/2021   IR ANGIOGRAM SELECTIVE EACH ADDITIONAL VESSEL  08/20/2021   IR ANGIOGRAM VISCERAL SELECTIVE  05/26/2021   IR ANGIOGRAM VISCERAL SELECTIVE  08/20/2021   IR EMBO ART  VEN HEMORR LYMPH EXTRAV  INC GUIDE ROADMAPPING  05/26/2021   IR EMBO ART  VEN HEMORR LYMPH EXTRAV  INC GUIDE ROADMAPPING  08/20/2021   IR FLUORO GUIDE CV LINE RIGHT  08/20/2021   IR THORACENTESIS ASP PLEURAL SPACE W/IMG GUIDE  10/23/2016   IR US GUIDE VASC ACCESS LEFT  05/26/2021   IR US GUIDE VASC ACCESS RIGHT  05/26/2021   IR US GUIDE VASC ACCESS RIGHT  08/20/2021   IR US GUIDE VASC ACCESS RIGHT  08/20/2021   MICROLARYNGOSCOPY Right 09/20/2017   Procedure: MICROLARYNGOSCOPY WITH  EXCISION OF VOCAL CORD LESION;  Surgeon: Leta Baptist, MD;  Location: [place];  Service: ENT;  Laterality: Right;   MULTIPLE EXTRACTIONS WITH ALVEOLOPLASTY N/A 06/10/2016   Procedure: Extraction of tooth #'s 2,8,13,15, and 29  with alveoloplasty, maxillary right and left buccal exostoses reductions, and gross debridement of remaining teeth.;  Surgeon: Lenn Cal, DDS;  Location: [place];  Service: Oral Surgery;  Laterality: N/A;   POLYPECTOMY  05/14/2021   Procedure: POLYPECTOMY;  Surgeon: Ronnette Juniper, MD;  [facility];  Service: Gastroenterology;;   REMOVAL OF STONES  08/19/2021   Procedure: REMOVAL OF STONES;  Surgeon: Clarene Essex, MD;  [facility];  Service: Gastroenterology;;   RIGHT/LEFT HEART CATH AND CORONARY ANGIOGRAPHY N/A 04/16/2016   Procedure: Right/Left Heart Cath and Coronary Angiography;  Surgeon: [name] Martinique, MD;  Location: [facility];  Service: Cardiovascular;  Laterality: N/A;   SPHINCTEROTOMY  08/19/2021   Procedure: SPHINCTEROTOMY;  Surgeon: Clarene Essex, MD;  [facility];  Service: Gastroenterology;;   TEE WITHOUT CARDIOVERSION N/A 09/15/2016   Procedure: TRANSESOPHAGEAL ECHOCARDIOGRAM (TEE);  Surgeon: Grace Isaac, MD;  Location: [place];  Service: Open Heart Surgery;  Laterality: N/A;    Short Social History:  Social History   Tobacco Use   Smoking status: Former    Packs/day: 1.00    Years: 25.00    Total pack years: 25.00    Types: Cigarettes   Smokeless tobacco: Never  Substance Use Topics   Alcohol use: No    Alcohol/week: 0.0 standard drinks of alcohol    No Known Allergies  Current Outpatient Medications  Medication Sig Dispense Refill   atenolol (TENORMIN) 25 MG tablet Take 12.5 mg by mouth 2 (two) times daily. 180 tablet 3   ferrous sulfate 325 (65 FE) MG tablet Take 325 mg by mouth daily.     fluticasone (FLONASE) 50 MCG/ACT nasal spray Place 2 sprays into both nostrils daily as needed for allergies or  rhinitis.     latanoprost (XALATAN) 0.005 % ophthalmic solution Place 1 drop into both eyes 2 (two) times daily.     Multiple Vitamin (MULTIVITAMIN WITH MINERALS) TABS tablet Take 1 tablet by mouth daily.     pantoprazole (PROTONIX) 40 MG tablet Take 40 mg by mouth 2 (two) times daily.     polyethylene glycol (MIRALAX / GLYCOLAX) 17 g packet Take 17 g by mouth daily as needed for mild constipation. Every other day     psyllium (HYDROCIL/METAMUCIL) 95 % PACK Take 1 packet by mouth daily as needed for mild constipation.     rosuvastatin (CRESTOR) 20 MG tablet Take 20 mg by mouth daily.     triamcinolone cream (KENALOG) 0.1 % Apply 1 application. topically 3 (three) times daily as needed (facial itching/rash).     acetaminophen (TYLENOL) 500 MG tablet Take 1 tablet (500 mg total) by mouth every 8 (eight) hours as needed for fever (pain). (Patient not taking: Reported on  10/03/2021)     No current facility-administered medications for this visit.    Review of Systems  Constitutional:  Constitutional negative. HENT: HENT negative.  Eyes: Eyes negative.  Cardiovascular: Cardiovascular negative.  GI: Gastrointestinal negative.  Musculoskeletal: Positive for joint pain.  Neurological: Neurological negative. Hematologic: Hematologic/lymphatic negative.  Psychiatric: Psychiatric negative.        Objective:  Objective   Vitals:   10/15/21 1110  BP: 134/82  Pulse: (!) 57  Resp: 20  Temp: 97.9 F (36.6 C)  SpO2: 96%  Weight: 193 lb 9.6 oz (87.8 kg)  Height: '5\' 10"'$  (1.778 m)   Body mass index is 27.78 kg/m.  Physical Exam HENT:     Head: Normocephalic.     Mouth/Throat:     Mouth: Mucous membranes are moist.  Eyes:     Pupils: Pupils are equal, round, and reactive to light.  Cardiovascular:     Rate and Rhythm: Normal rate.     Pulses:          Femoral pulses are 2+ on the right side and 2+ on the left side.      Popliteal pulses are 3+ on the right side and 3+ on the left side.  Pulmonary:     Effort: Pulmonary effort is normal.  Abdominal:     General: Abdomen is flat.     Palpations: Abdomen is soft.  Musculoskeletal:     Right lower leg: No edema.     Left lower leg: No edema.  Skin:    General: Skin is warm and dry.     Capillary Refill: Capillary refill takes less than 2 seconds.  Neurological:     General: No focal deficit present.     Mental Status: He is alert.  Psychiatric:        Mood and Affect: Mood normal.     Data: CTA IMPRESSION: Aneurysm disease, including:   -infrarenal abdominal aortic aneurysm estimated 5.3 cm Aortic aneurysm NOS (ICD10-I71.9).   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 No high-grade stenosis or occlusion. Aortic Atherosclerosis (ICD10-I70.0).   There is relative under filling of the right-sided tibial arteries distally as compared to the left, which may indicate late arrival of the contrast bolus versus  distal occlusive disease.   Additional ancillary findings as above     Assessment/Plan:    71 year old male with 5.5 cm abdominal aortic aneurysm by my measurements today in reviewing the CT scan with the patient.  I discussed the need for endovascular aneurysm repair to prevent rupture although this does not need to be done urgently.  He also has bilateral hypogastric aneurysms and I will need to figure out a way to possibly cover 1 coil and extend over another.  I discussed that possibly we will ignore the hypogastric artery aneurysms at this time and just treat the aortic aneurysm continue watchful waiting of the hypogastric and he demonstrates good understanding of this.  Popliteal arteries although 3+ palpable do not appear to be aneurysmal on recent CTA will not need further evaluation.  We will get cardiac clearance to get him scheduled in the near future.    Waynetta Sandy MD [facility]

## 2021-10-17 ENCOUNTER — Inpatient Hospital Stay: Payer: Medicare Other

## 2021-10-17 ENCOUNTER — Ambulatory Visit: Payer: Medicare Other

## 2021-10-17 ENCOUNTER — Other Ambulatory Visit: Payer: Medicare Other

## 2021-10-17 DIAGNOSIS — K922 Gastrointestinal hemorrhage, unspecified: Secondary | ICD-10-CM | POA: Diagnosis not present

## 2021-10-17 DIAGNOSIS — D693 Immune thrombocytopenic purpura: Secondary | ICD-10-CM

## 2021-10-17 DIAGNOSIS — D509 Iron deficiency anemia, unspecified: Secondary | ICD-10-CM

## 2021-10-17 DIAGNOSIS — Z79899 Other long term (current) drug therapy: Secondary | ICD-10-CM | POA: Diagnosis not present

## 2021-10-17 LAB — CMP (CANCER CENTER ONLY)
ALT: 9 U/L (ref 0–44)
AST: 19 U/L (ref 15–41)
Albumin: 4.7 g/dL (ref 3.5–5.0)
Alkaline Phosphatase: 48 U/L (ref 38–126)
Anion gap: 7 (ref 5–15)
BUN: 12 mg/dL (ref 8–23)
CO2: 30 mmol/L (ref 22–32)
Calcium: 9.8 mg/dL (ref 8.9–10.3)
Chloride: 101 mmol/L (ref 98–111)
Creatinine: 1.12 mg/dL (ref 0.61–1.24)
GFR, Estimated: >60 mL/min (ref >60)
Glucose, Bld: 103 mg/dL — ABNORMAL HIGH (ref 70–99)
Potassium: 3.9 mmol/L (ref 3.5–5.1)
Sodium: 138 mmol/L (ref 135–145)
Total Bilirubin: 0.7 mg/dL (ref 0.3–1.2)
Total Protein: 7.8 g/dL (ref 6.5–8.1)

## 2021-10-17 LAB — CBC WITH DIFFERENTIAL (CANCER CENTER ONLY)
Abs Immature Granulocytes: 0.03 10*3/uL (ref 0.00–0.07)
Basophils Absolute: 0 10*3/uL (ref 0.0–0.1)
Basophils Relative: 0 %
Eosinophils Absolute: 0.2 10*3/uL (ref 0.0–0.5)
Eosinophils Relative: 4 %
HCT: 36.9 % — ABNORMAL LOW (ref 39.0–52.0)
Hemoglobin: 12 g/dL — ABNORMAL LOW (ref 13.0–17.0)
Immature Granulocytes: 1 %
Lymphocytes Relative: 19 %
Lymphs Abs: 1.2 10*3/uL (ref 0.7–4.0)
MCH: 29.7 pg (ref 26.0–34.0)
MCHC: 32.5 g/dL (ref 30.0–36.0)
MCV: 91.3 fL (ref 80.0–100.0)
Monocytes Absolute: 0.6 10*3/uL (ref 0.1–1.0)
Monocytes Relative: 10 %
Neutro Abs: 4.2 10*3/uL (ref 1.7–7.7)
Neutrophils Relative %: 66 %
Platelet Count: 105 10*3/uL — ABNORMAL LOW (ref 150–400)
RBC: 4.04 MIL/uL — ABNORMAL LOW (ref 4.22–5.81)
RDW: 14.5 % (ref 11.5–15.5)
WBC Count: 6.2 10*3/uL (ref 4.0–10.5)
nRBC: 0 % (ref 0.0–0.2)

## 2021-10-19 NOTE — Progress Notes (Unsigned)
Cardiology Clinic Note   Patient Name: Lonnie Hansen Date of Encounter: 10/21/2021  Primary Care Provider:  [name], MD Primary Cardiologist:  Peter Martinique, MD  Patient Profile    Lonnie Penna.  Hansen 71 year old male presents today for evaluation of his  hypertension and coronary artery disease.  Past Medical History    Past Medical History:  Diagnosis Date   AAA (abdominal aortic aneurysm) ([place])    Anemia    Arthritis    Asymptomatic bilateral carotid artery stenosis 08/2015   1-39%    Cataracts, bilateral    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   Coronary artery disease    Diverticulosis    Enlarged aorta (HCC)    Enlarged prostate    slightly   GERD (gastroesophageal reflux disease)    takes Pantoprazole daily as needed   Glaucoma    uses eye drops daily   Headache    History of colon polyps    benign   History of kidney stones    Hyperlipidemia    no on any meds   Hypertension    takes Amlodipine and Atenolol daily   OSA on CPAP    Vocal cord nodule    pt. states  it's a" growth on vocal cord"   Past Surgical History:  Procedure Laterality Date   AORTIC ARCH ANGIOGRAPHY N/A 04/16/2016   Procedure: Aortic Arch Angiography;  Surgeon: [name] Martinique, MD;  Location: [facility];  Service: Cardiovascular;  Laterality: N/A;   AORTIC VALVE REPLACEMENT N/A 09/15/2016   Procedure: AORTIC VALVE REPLACEMENT (AVR);  Surgeon: Grace Isaac, MD;  Location: [place];  Service: Open Heart Surgery;  Laterality: N/A;  Using 9m Edwards Perimount Magna Ease Aortic Bioprosthesis Valve   ASCENDING AORTIC ROOT REPLACEMENT N/A 09/15/2016   Procedure: ASCENDING AORTIC ROOT REPLACEMENT;  Surgeon: GGrace Isaac MD;  [place]  Service: Open Heart Surgery;  Laterality: N/A;  Using 313mGelweave Valsalva Graft   CHOLECYSTECTOMY N/A 12/19/2020   Procedure: LAPAROSCOPIC CHOLECYSTECTOMY WITH POSSIBLE INTRAOPERATIVE CHOLANGIOGRAM;   Surgeon: WiGreer PickerelMD;  [place] [facility] Service: General;  Laterality: N/A;   COLONOSCOPY     COLONOSCOPY Left 04/16/2019   Procedure: COLONOSCOPY;  Surgeon: OuArta SilenceMD;  [facility];  Service: Endoscopy;  Laterality: Left;   COLONOSCOPY N/A 06/01/2020   Procedure: COLONOSCOPY;  Surgeon: KaRonnette JuniperMD;  [place] [facility] Service: Gastroenterology;  Laterality: N/A;   COLONOSCOPY WITH ESOPHAGOGASTRODUODENOSCOPY (EGD)     COLONOSCOPY WITH PROPOFOL N/A 05/14/2021   Procedure: COLONOSCOPY WITH PROPOFOL;  Surgeon: KaRonnette JuniperMD;  [facility];  Service: Gastroenterology;  Laterality: N/A;   ERCP N/A 08/19/2021   Procedure: ENDOSCOPIC RETROGRADE CHOLANGIOPANCREATOGRAPHY (ERCP);  Surgeon: MaClarene EssexMD;  [facility];  Service: Gastroenterology;  Laterality: N/A;  at 1:30 pm   ESOPHAGOGASTRODUODENOSCOPY N/A 05/25/2020   Procedure: ESOPHAGOGASTRODUODENOSCOPY (EGD);  Surgeon: ScWilford CornerMD;  [place] [facility] Service: Endoscopy;  Laterality: N/A;   ESOPHAGOGASTRODUODENOSCOPY N/A 08/19/2021   Procedure: ESOPHAGOGASTRODUODENOSCOPY (EGD);  Surgeon: BrOtis BraceMD;  [facility];  Service: Gastroenterology;  Laterality: N/A;   ESOPHAGOGASTRODUODENOSCOPY (EGD) WITH PROPOFOL Left 04/16/2019   Procedure: ESOPHAGOGASTRODUODENOSCOPY (EGD) WITH PROPOFOL;  Surgeon: OuArta SilenceMD;  [facility];  Service: Endoscopy;  Laterality: Left;   ESOPHAGOGASTRODUODENOSCOPY (EGD) WITH PROPOFOL N/A 08/23/2021   Procedure: ESOPHAGOGASTRODUODENOSCOPY (EGD) WITH PROPOFOL;  Surgeon: KaRonnette JuniperMD;  [facility];  Service: Gastroenterology;  Laterality: N/A;   FLEXIBLE SIGMOIDOSCOPY N/A 05/25/2020   Procedure: FLEXIBLE SIGMOIDOSCOPY;  Surgeon: Wilford Corner, MD;  Location: [place];  Service: Endoscopy;  Laterality: N/A;   HEMOSTASIS CONTROL  08/19/2021   Procedure: HEMOSTASIS CONTROL;  Surgeon: Otis Brace, MD;   [facility];  Service: Gastroenterology;;   IR [place] VESSEL  05/26/2021   IR ANGIOGRAM SELECTIVE EACH ADDITIONAL VESSEL  08/20/2021   IR ANGIOGRAM SELECTIVE EACH ADDITIONAL VESSEL  08/20/2021   IR ANGIOGRAM VISCERAL SELECTIVE  05/26/2021   IR ANGIOGRAM VISCERAL SELECTIVE  08/20/2021   IR EMBO ART  VEN HEMORR LYMPH EXTRAV  INC GUIDE ROADMAPPING  05/26/2021   IR EMBO ART  VEN HEMORR LYMPH EXTRAV  INC GUIDE ROADMAPPING  08/20/2021   IR FLUORO GUIDE CV LINE RIGHT  08/20/2021   IR THORACENTESIS ASP PLEURAL SPACE W/IMG GUIDE  10/23/2016   IR US GUIDE VASC ACCESS LEFT  05/26/2021   IR US GUIDE VASC ACCESS RIGHT  05/26/2021   IR US GUIDE VASC ACCESS RIGHT  08/20/2021   IR US GUIDE VASC ACCESS RIGHT  08/20/2021   MICROLARYNGOSCOPY Right 09/20/2017   Procedure: MICROLARYNGOSCOPY WITH  EXCISION OF VOCAL CORD LESION;  Surgeon: Leta Baptist, MD;  Location: [facility];  Service: ENT;  Laterality: Right;   MULTIPLE EXTRACTIONS WITH ALVEOLOPLASTY N/A 06/10/2016   Procedure: Extraction of tooth #'s 2,8,13,15, and 29  with alveoloplasty, maxillary right and left buccal exostoses reductions, and gross debridement of remaining teeth.;  Surgeon: Lenn Cal, DDS;  Location: [facility];  Service: Oral Surgery;  Laterality: N/A;   POLYPECTOMY  05/14/2021   Procedure: POLYPECTOMY;  Surgeon: Ronnette Juniper, MD;  [facility];  Service: Gastroenterology;;   REMOVAL OF STONES  08/19/2021   Procedure: REMOVAL OF STONES;  Surgeon: Clarene Essex, MD;  [facility];  Service: Gastroenterology;;   RIGHT/LEFT HEART CATH AND CORONARY ANGIOGRAPHY N/A 04/16/2016   Procedure: Right/Left Heart Cath and Coronary Angiography;  Surgeon: [name] Martinique, MD;  Location: [facility];  Service: Cardiovascular;  Laterality: N/A;   SPHINCTEROTOMY  08/19/2021   Procedure: SPHINCTEROTOMY;  Surgeon: Clarene Essex, MD;  [facility];  Service: Gastroenterology;;   TEE WITHOUT  CARDIOVERSION N/A 09/15/2016   Procedure: TRANSESOPHAGEAL ECHOCARDIOGRAM (TEE);  Surgeon: Grace Isaac, MD;  Location: [facility];  Service: Open Heart Surgery;  Laterality: N/A;    Allergies  No Known Allergies  History of Present Illness    Lonnie Hansen has a past medical history of hyperlipidemia, hypertension, mild coronary artery disease, and severe aortic insufficiency with aortic root dilation status post repair.  In 2017 his CXR showed aortic root enlargement.  He underwent a CT which showed a dilated proximal aorta.  His descending aorta was normal.  Echocardiogram 8/17 showed an EF of 60-65%, moderate to severe AI, and G1 DD.  He was evaluated by Dr. Servando Snare.  He had a follow-up CT that showed no aortic root enlargement of 5.2 cm.  Due to the size of aortic root and degree of aortic insufficiency it was recommended that he undergo aortic root grafting and AVR.  He had a preoperative cardiac catheterization 3/18 that showed severe AI, EF 45%, 80% stenosis in his nondominant RCA, and 50% distal left anterior descending.  He underwent planned procedure with aortic ascending root replacement, and AVR on 8/18 by Dr. Servando Snare.  Postoperatively he was fluid volume overloaded and responded well to diuresis.  He also had postoperative thrombocytopenia.  He was placed on oral amiodarone for postop atrial fibrillation.   A follow-up CXR showed recurrent right pleural effusion.  Echocardiogram 9/18 showed only trivial amount of pericardial effusion, EF 60-65%.  He underwent right thoracentesis on 09/2016 with removal of 2 L of dark red blood.  Post procedure he developed a right pneumothorax related to incomplete reexpansion of his right middle lobe and lower lobe.  He was admitted on 10/18 for a pigtail drain placement, last CXR obtained on 10/18 showed improvement in right pleural effusion and resolution of right pneumo.  He is followed by Dr. Servando Snare and his repeat CT 10/2018 was improved.   He was  last seen by Dr. Martinique on 12/2018.  During that time he stated he felt okay.  He had been going to the gym daily.  He was still 18 pounds up from 2019.  He denied dyspnea, chest pain, lower extremity edema.  It was noted that he would eventually need a hip replacement.   He contacted the nurse triage line on 05/10/2019 with complaints of increased shortness of breath and elevated blood pressure.   He presented to the clinic 05/11/19 and stated he  had an intermittent episodes of dizziness.  He noticed it started in the morning a few weeks ago when he woke up and looked at his phone.  He described his symptoms the past room spinning feeling that is relieved by laying down.  Symptoms were also by increasing physical exercise.  He presented to his PCP and was diagnosed with vertigo.  He was treated with Flonase.  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  He was also exercising 1 hour/day at the [facility].  I asked him to increase his p.o. fluids.  I planned a repeat echocardiogram, ordered BMP, and planned follow-up with Dr. Martinique in 3 months.    He was seen in follow-up by Dr. Martinique 06/30/2021.  He reported being seen by Dr. Cyndia Bent 06/17/2021 for follow-up of his thoracic aneurysm.  CT showed stable aneurysm measuring 4.7 cm beyond the prior repair follow-up was planned for 1 year.  He continued to do well.  His hemoglobin was up to 10.9 with iron infusions.  He denies chest pain, palpitations, dizziness, shortness of breath, recurrent bleeding and was off ASA.  He presents to the clinic today for follow-up evaluation states he feels very well.  He was recently seen by Dr. Donzetta Matters who has recommended endovascular aneurysm repair.  He denies chest pain and shortness of breath.  He denies bleeding issues.  He remains somewhat physically active playing piano.  He has a follow-up appointment scheduled in December with Dr. Martinique which we will keep.   I will have him continue heart healthy low-sodium diet.  We reviewed his recent lab work.       Home Medications    Prior to Admission medications   Medication Sig Start Date End Date Taking? Authorizing Provider  acetaminophen (TYLENOL) 500 MG tablet Take 1 tablet (500 mg total) by mouth every 8 (eight) hours as needed for fever (pain). Patient not taking: Reported on 10/03/2021 08/25/21   Modena Jansky, MD  atenolol (TENORMIN) 25 MG tablet Take 12.5 mg by mouth 2 (two) times daily. 08/26/20   [name], MD  ferrous sulfate 325 (65 FE) MG tablet Take 325 mg by mouth daily.    [provider]  fluticasone (FLONASE) 50 MCG/ACT nasal  spray Place 2 sprays into both nostrils daily as needed for allergies or rhinitis.    [provider]  latanoprost (XALATAN) 0.005 % ophthalmic solution Place 1 drop into both eyes 2 (two) times daily.    [provider]  Multiple Vitamin (MULTIVITAMIN WITH MINERALS) TABS tablet Take 1 tablet by mouth daily.    [provider]  pantoprazole (PROTONIX) 40 MG tablet Take 40 mg by mouth 2 (two) times daily. 11/09/20   [provider]  polyethylene glycol (MIRALAX / GLYCOLAX) 17 g packet Take 17 g by mouth daily as needed for mild constipation. Every other day 08/25/21   Modena Jansky, MD  psyllium (HYDROCIL/METAMUCIL) 95 % PACK Take 1 packet by mouth daily as needed for mild constipation. 08/25/21   Hongalgi, Lenis Dickinson, MD  rosuvastatin (CRESTOR) 20 MG tablet Take 20 mg by mouth daily.    [provider]  triamcinolone cream (KENALOG) 0.1 % Apply 1 application. topically 3 (three) times daily as needed (facial itching/rash). 05/16/20   [provider]    Family History    Family History  Problem Relation Age of Onset   Heart disease Father    Heart attack Father    Thyroid disease Sister    He indicated that his mother is deceased. He indicated that his father is deceased. He indicated that his  sister is alive.  Social History    Social History   Socioeconomic History   Marital status: Married    Spouse name: Not on file   Number of children: 1   Years of education: Not on file   Highest education level: Not on file  Occupational History   Occupation: Musician  Tobacco Use   Smoking status: Former    Packs/day: 1.00    Years: 25.00    Total pack years: 25.00    Types: Cigarettes   Smokeless tobacco: Never  Vaping Use   Vaping Use: Never used  Substance and Sexual Activity   Alcohol use: No    Alcohol/week: 0.0 standard drinks of alcohol   Drug use: No   Sexual activity: Not on file  Other Topics Concern   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   Social Determinants of Health   Financial Resource Strain: Not on file  Food Insecurity: No Food Insecurity (05/30/2020)   Hunger Vital Sign    Worried About Running Out of Food in the Last Year: Never true    Ran Out of Food in the Last Year: Never true  Transportation Needs: No Transportation Needs (05/30/2020)   PRAPARE - Hydrologist (Medical): No    Lack of Transportation (Non-Medical): No  Physical Activity: Not on file  Stress: Not on file  Social Connections: Not on file  Intimate Partner Violence: Not on file     Review of Systems    General:  No chills, fever, night sweats or weight changes.  Cardiovascular:  No chest pain, dyspnea on exertion, edema, orthopnea, palpitations, paroxysmal nocturnal dyspnea. Dermatological: No rash, lesions/masses Respiratory: No cough, dyspnea Urologic: No hematuria, dysuria Abdominal:   No nausea, vomiting, diarrhea, bright red blood per rectum, melena, or hematemesis Neurologic:  No visual changes, wkns, changes in mental status. All other systems reviewed and are otherwise negative except as noted above.  Physical Exam    VS:  BP 126/72   Pulse Marland Kitchen)  57   Ht '5\' 10"'$  (1.778 m)   Wt  196 lb (88.9 kg)   SpO2 99%   BMI 28.12 kg/m  , BMI Body mass index is 28.12 kg/m. GEN: Well nourished, well developed, in no acute distress. HEENT: normal. Neck: Supple, no JVD, carotid bruits, or masses. Cardiac: RRR, 3/6 systolic murmur heard along right sternal border, rubs, or gallops. No clubbing, cyanosis, edema.  Radials/DP/PT 2+ and equal bilaterally.  Respiratory:  Respirations regular and unlabored, clear to auscultation bilaterally. GI: Soft, nontender, nondistended, BS + x 4. MS: no deformity or atrophy. Skin: warm and dry, no rash. Neuro:  Strength and sensation are intact. Psych: Normal affect.  Accessory Clinical Findings    Recent Labs: 08/24/2021: Magnesium 1.8 10/17/2021: ALT 9; BUN 12; Creatinine 1.12; Hemoglobin 12.0; Platelet Count 105; Potassium 3.9; Sodium 138   Recent Lipid Panel    Component Value Date/Time   CHOL 157 05/11/2019 0946   TRIG 758 (H) 08/20/2021 0612   HDL 32 (L) 05/11/2019 0946   CHOLHDL 4.9 05/11/2019 0946   CHOLHDL 3.5 04/28/2010 0703   VLDL 14 04/28/2010 0703   LDLCALC 81 05/11/2019 0946         ECG personally reviewed by me today-none today.  Echocardiogram 10/09/2016 Study Conclusions   - Left ventricle: The cavity size was normal. Wall thickness was    increased in a pattern of mild LVH. Systolic function was normal.    The estimated ejection fraction was in the range of 60% to 65%.  - Aortic valve: AV prosthesis appears to open well Peak and mean    gradients through the valve are 9 and 5 mm Hg respectively.  - Mitral valve: Calcified annulus. Mildly thickened leaflets .    There was mild regurgitation.  - Pulmonary arteries: PA peak pressure: 37 mm Hg (S).  - Pericardium, extracardiac: A trivial pericardial effusion was    identified.   Cardiac catheterization 04/16/2016 Mid RCA lesion, 80 %stenosed. Ost LAD to Prox LAD lesion, 25 %stenosed. Dist LAD lesion, 50 %stenosed. There is mild left ventricular systolic  dysfunction. LV end diastolic pressure is normal. The left ventricular ejection fraction is 45-50% by visual estimate. There is no mitral valve regurgitation. There is no aortic valve stenosis. LV end diastolic pressure is normal.   1. 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 Mild LV dysfunction. EF 45%. 3. Ascending thoracic aortic aneurysm. 4. 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 5. Normal Right heart  And LV filling pressures 6. Normal cardiac output.    Plan: Aortic root grafting and AVR.   Echocardiogram 09/16/2020  IMPRESSIONS     1. Left ventricular ejection fraction, by estimation, is 65 to 70%. The  left ventricle has normal function. The left ventricle has no regional  wall motion abnormalities. There is mild left ventricular hypertrophy.  Left ventricular diastolic parameters  were normal.   2. Right ventricular systolic function is normal. The right ventricular  size is normal.   3. Mild mitral valve regurgitation.   4. 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 No  significant change from report of April 2021. . The aortic valve has been  repaired/replaced. Aortic valve  regurgitation is not visualized.  Assessment & Plan   1.  History of aortic root and AVR with tissue valve-echocardiogram 8/22 with showed normal LV function with the  prosthetic valve peak gradient  of 22 and mean gradient of 11 mmHg.  No significant change since 4/21.   Repeat echocardiogram   Coronary artery disease-no chest pain today.  Underwent cardiac catheterizations 3/18 showed an 80% stenosis nondominant mid RCA and 50% distal LAD lesion.  Medical management was recommended Continue ASA, Crestor Heart healthy low-sodium diet Increase physical activity as tolerated   Essential hypertension-BP today 132/72.  Has been slightly elevated at home. Continue amlodipine, Atenolol  Heart  healthy low-sodium diet-salty 6 reviewed Increase physical activity as tolerated     Hyperlipidemia-LDL  64 on 02/03/21 Continue rosuvastatin  Heart healthy low-sodium high-fiber diet Increase physical activity as tolerated Repeat fasting lipids and LFTs   Dizziness/lightheadedness-no recent episodes.  Previously felt to be  related to vertigo.   Continue Flonase as directed Maintain p.o. fluids Heart healthy low-sodium diet Increase physical activity as tolerated Follow-up with PCP if continued   Preop- endovascular aneurysm repair, Dr. Servando Snare, TBD    Primary Cardiologist: Peter Martinique, MD  Chart reviewed as part of pre-operative protocol coverage. Given past medical history and time since last visit, based on ACC/AHA guidelines, Lonnie Hansen would be at acceptable risk for the planned procedure without further cardiovascular testing.   Patient was advised that if he develops new symptoms prior to surgery to contact our office to arrange a follow-up appointment.  He verbalized understanding.  His RCRI is a class III risk, 6.6% risk of major cardiac event.  He is able to complete greater than 4 METS of physical activity.     Disposition: Follow-up with Dr. Martinique 3 months   Lonnie Ng. Jenice Leiner NP-C     10/21/2021, 2:53 PM [place] [address] Office [phone] Fax [phone]  Notice: This dictation was prepared with Dragon dictation along with smaller phrase technology. Any transcriptional errors that result from this process are unintentional and may not be corrected upon review.  I spent 15 minutes examining this patient, reviewing medications, and using patient centered shared decision making involving her cardiac care.  Prior to her visit I spent greater than 20 minutes reviewing her past medical history,  medications, and prior cardiac tests.

## 2021-10-21 ENCOUNTER — Encounter: Payer: Self-pay | Admitting: General Practice

## 2021-10-21 ENCOUNTER — Ambulatory Visit: Payer: Medicare Other | Attending: General Practice | Admitting: General Practice

## 2021-10-21 VITALS — BP 126/72 | HR 57 | Ht 70.0 in | Wt 196.0 lb

## 2021-10-21 DIAGNOSIS — Z9889 Other specified postprocedural states: Secondary | ICD-10-CM | POA: Diagnosis not present

## 2021-10-21 DIAGNOSIS — I25118 Atherosclerotic heart disease of native coronary artery with other forms of angina pectoris: Secondary | ICD-10-CM | POA: Diagnosis not present

## 2021-10-21 DIAGNOSIS — Z952 Presence of prosthetic heart valve: Secondary | ICD-10-CM | POA: Diagnosis not present

## 2021-10-21 DIAGNOSIS — I10 Essential (primary) hypertension: Secondary | ICD-10-CM | POA: Diagnosis not present

## 2021-10-21 DIAGNOSIS — Z01810 Encounter for preprocedural cardiovascular examination: Secondary | ICD-10-CM | POA: Insufficient documentation

## 2021-10-21 NOTE — Patient Instructions (Signed)
Medication Instructions:  The current medical regimen is effective;  continue present plan and medications as directed. Please refer to the Current Medication list given to you today.  *If you need a refill on your cardiac medications before your next appointment, please call your pharmacy*   Lab Work: NONE  Testing/Procedures: OK FOR ENDOVASCULAR ANEURYSM REPAIR  Follow-Up: At [facility], you and your health needs are our priority.  As part of our continuing mission to provide you with exceptional heart care, we have created designated Provider Care Teams.  These Care Teams include your primary Cardiologist (physician) and Advanced Practice Providers (APPs -  Physician Assistants and Nurse Practitioners) who all work together to provide you with the care you need, when you need it.  Your next appointment:   KEEP SCHEDULED APPOINTMENT   The format for your next appointment:   In Person  Provider:   Peter Martinique, MD     Important Information About Sugar

## 2021-10-23 ENCOUNTER — Other Ambulatory Visit: Payer: Self-pay

## 2021-10-23 DIAGNOSIS — I714 Abdominal aortic aneurysm, without rupture, unspecified: Secondary | ICD-10-CM

## 2021-10-23 DIAGNOSIS — Z5189 Encounter for other specified aftercare: Secondary | ICD-10-CM

## 2021-10-31 ENCOUNTER — Inpatient Hospital Stay: Payer: Medicare Other | Attending: Hematology & Oncology

## 2021-10-31 ENCOUNTER — Inpatient Hospital Stay: Payer: Medicare Other

## 2021-10-31 ENCOUNTER — Inpatient Hospital Stay (HOSPITAL_BASED_OUTPATIENT_CLINIC_OR_DEPARTMENT_OTHER): Payer: Medicare Other | Admitting: Family

## 2021-10-31 ENCOUNTER — Encounter: Payer: Self-pay | Admitting: Family

## 2021-10-31 VITALS — BP 154/90 | HR 56 | Temp 98.3°F | Resp 18 | Wt 199.0 lb

## 2021-10-31 DIAGNOSIS — D693 Immune thrombocytopenic purpura: Secondary | ICD-10-CM | POA: Insufficient documentation

## 2021-10-31 DIAGNOSIS — Z79899 Other long term (current) drug therapy: Secondary | ICD-10-CM | POA: Diagnosis not present

## 2021-10-31 DIAGNOSIS — R200 Anesthesia of skin: Secondary | ICD-10-CM | POA: Insufficient documentation

## 2021-10-31 DIAGNOSIS — Z8679 Personal history of other diseases of the circulatory system: Secondary | ICD-10-CM | POA: Insufficient documentation

## 2021-10-31 DIAGNOSIS — R202 Paresthesia of skin: Secondary | ICD-10-CM | POA: Insufficient documentation

## 2021-10-31 DIAGNOSIS — D509 Iron deficiency anemia, unspecified: Secondary | ICD-10-CM

## 2021-10-31 LAB — CBC WITH DIFFERENTIAL (CANCER CENTER ONLY)
Abs Immature Granulocytes: 0.08 10*3/uL — ABNORMAL HIGH (ref 0.00–0.07)
Basophils Absolute: 0 10*3/uL (ref 0.0–0.1)
Basophils Relative: 1 %
Eosinophils Absolute: 0.2 10*3/uL (ref 0.0–0.5)
Eosinophils Relative: 3 %
HCT: 36.4 % — ABNORMAL LOW (ref 39.0–52.0)
Hemoglobin: 11.7 g/dL — ABNORMAL LOW (ref 13.0–17.0)
Immature Granulocytes: 1 %
Lymphocytes Relative: 22 %
Lymphs Abs: 1.3 10*3/uL (ref 0.7–4.0)
MCH: 29.3 pg (ref 26.0–34.0)
MCHC: 32.1 g/dL (ref 30.0–36.0)
MCV: 91.2 fL (ref 80.0–100.0)
Monocytes Absolute: 0.7 10*3/uL (ref 0.1–1.0)
Monocytes Relative: 13 %
Neutro Abs: 3.6 10*3/uL (ref 1.7–7.7)
Neutrophils Relative %: 60 %
Platelet Count: 65 10*3/uL — ABNORMAL LOW (ref 150–400)
RBC: 3.99 MIL/uL — ABNORMAL LOW (ref 4.22–5.81)
RDW: 13.9 % (ref 11.5–15.5)
WBC Count: 5.9 10*3/uL (ref 4.0–10.5)
nRBC: 0 % (ref 0.0–0.2)

## 2021-10-31 LAB — IRON AND IRON BINDING CAPACITY (CC-WL,HP ONLY)
Iron: 91 ug/dL (ref 45–182)
Saturation Ratios: 22 % (ref 17.9–39.5)
TIBC: 419 ug/dL (ref 250–450)
UIBC: 328 ug/dL (ref 117–376)

## 2021-10-31 LAB — CMP (CANCER CENTER ONLY)
ALT: 7 U/L (ref 0–44)
AST: 18 U/L (ref 15–41)
Albumin: 4.7 g/dL (ref 3.5–5.0)
Alkaline Phosphatase: 45 U/L (ref 38–126)
Anion gap: 8 (ref 5–15)
BUN: 14 mg/dL (ref 8–23)
CO2: 30 mmol/L (ref 22–32)
Calcium: 10 mg/dL (ref 8.9–10.3)
Chloride: 102 mmol/L (ref 98–111)
Creatinine: 1.09 mg/dL (ref 0.61–1.24)
GFR, Estimated: >60 mL/min (ref >60)
Glucose, Bld: 106 mg/dL — ABNORMAL HIGH (ref 70–99)
Potassium: 3.7 mmol/L (ref 3.5–5.1)
Sodium: 140 mmol/L (ref 135–145)
Total Bilirubin: 0.8 mg/dL (ref 0.3–1.2)
Total Protein: 7.7 g/dL (ref 6.5–8.1)

## 2021-10-31 LAB — RETICULOCYTES
Immature Retic Fract: 11.4 % (ref 2.3–15.9)
RBC.: 3.98 MIL/uL — ABNORMAL LOW (ref 4.22–5.81)
Retic Count, Absolute: 83.6 10*3/uL (ref 19.0–186.0)
Retic Ct Pct: 2.1 % (ref 0.4–3.1)

## 2021-10-31 LAB — FERRITIN: Ferritin: 28 ng/mL (ref 24–336)

## 2021-10-31 MED ORDER — ROMIPLOSTIM 125 MCG ~~LOC~~ SOLR
95.0000 ug | Freq: Once | SUBCUTANEOUS | Status: AC
  Administered 2021-10-31: 95 ug via SUBCUTANEOUS
  Filled 2021-10-31: qty 0.19

## 2021-10-31 NOTE — Progress Notes (Signed)
Hematology and Oncology Follow Up Visit  BALTAZAR Hansen [id number] 1950/02/10 71 y.o. 10/31/2021   Principle Diagnosis:  Chronic ITP   Current Therapy:        Nplate to keep platelet count over 100,000   Interim History:  [name] is here today for follow-up and Nplate injection. 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  Platelet count today is 65.  No blood loss, bruising or petechiae noted.  No fever, chills, n/v, cough, rash, dizziness, SOB, chest pain, palpitations, abdominal pain or changes in bowel or bladder habits at this time.  He notes intermittent numbness and tingling in his right hand.  No swelling or tenderness in his extremities at this time.  No falls or syncope. He ambulates with his cane for added support.  He states that he is eating well and doing his best to stay well hydrated. Weight today is stable at 199 lbs.   ECOG Performance Status: 1 - Symptomatic but completely ambulatory  Medications:  Allergies as of 10/31/2021   No Known Allergies      Medication List        Accurate as of October 31, 2021 11:38 AM. If you have any questions, ask your nurse or doctor.          acetaminophen 500 MG tablet Commonly known as: TYLENOL Take 1 tablet (500 mg total) by mouth every 8 (eight) hours as needed for fever (pain).   atenolol 25 MG tablet Commonly known as: TENORMIN Take 12.5 mg by mouth 2 (two) times daily.   doxycycline 100 MG tablet Commonly known as: VIBRA-TABS Take 100 mg by mouth 2 (two) times daily. X 7 days   ferrous sulfate 325 (65 FE) MG tablet Take 325 mg by mouth daily.   fluticasone 50 MCG/ACT nasal spray Commonly known as: FLONASE Place 2 sprays into both nostrils daily as needed for allergies or rhinitis.   latanoprost 0.005 % ophthalmic solution Commonly known as: XALATAN Place 1 drop into both eyes 2 (two) times daily.   multivitamin with minerals Tabs tablet Take 1 tablet by mouth daily.    pantoprazole 40 MG tablet Commonly known as: PROTONIX Take 40 mg by mouth 2 (two) times daily.   polyethylene glycol 17 g packet Commonly known as: MIRALAX / GLYCOLAX Take 17 g by mouth daily as needed for mild constipation. Every other day   psyllium 95 % Pack Commonly known as: HYDROCIL/METAMUCIL Take 1 packet by mouth daily as needed for mild constipation.   rosuvastatin 20 MG tablet Commonly known as: CRESTOR Take 20 mg by mouth daily.   triamcinolone cream 0.1 % Commonly known as: KENALOG Apply 1 application. topically 3 (three) times daily as needed (facial itching/rash).        Allergies: No Known Allergies  Past Medical History, Surgical history, Social history, and Family History were reviewed and updated.  Review of Systems: All other 10 point review of systems is negative.   Physical Exam:  vitals were not taken for this visit.   Wt Readings from Last 3 Encounters:  10/21/21 196 lb (88.9 kg)  10/15/21 193 lb 9.6 oz (87.8 kg)  10/03/21 197 lb (89.4 kg)    Ocular: Sclerae unicteric, pupils equal, round and reactive to light Ear-nose-throat: Oropharynx clear, dentition fair Lymphatic: No cervical or supraclavicular adenopathy Lungs no rales or rhonchi, good excursion bilaterally Heart regular rate and rhythm, no murmur appreciated Abd soft, nontender, positive bowel sounds MSK no focal spinal  tenderness, no joint edema Neuro: non-focal, well-oriented, appropriate affect Breasts: Deferred   Lab Results  Component Value Date   WBC 6.2 10/17/2021   HGB 12.0 (L) 10/17/2021   HCT 36.9 (L) 10/17/2021   MCV 91.3 10/17/2021   PLT 105 (L) 10/17/2021   Lab Results  Component Value Date   FERRITIN 44 10/03/2021   IRON 76 10/03/2021   TIBC 417 10/03/2021   UIBC 341 10/03/2021   IRONPCTSAT 18 10/03/2021   Lab Results  Component Value Date   RETICCTPCT 2.5 10/03/2021   RBC 4.04 (L) 10/17/2021   No results found for: "KPAFRELGTCHN", "LAMBDASER",  "KAPLAMBRATIO" No results found for: "IGGSERUM", "IGA", "IGMSERUM" No results found for: "TOTALPROTELP", "ALBUMINELP", "A1GS", "A2GS", "BETS", "BETA2SER", "GAMS", "MSPIKE", "SPEI"   Chemistry      Component Value Date/Time   NA 138 10/17/2021 1121   NA 140 05/11/2019 0946   K 3.9 10/17/2021 1121   CL 101 10/17/2021 1121   CO2 30 10/17/2021 1121   BUN 12 10/17/2021 1121   BUN 17 05/11/2019 0946   CREATININE 1.12 10/17/2021 1121   CREATININE 0.75 04/10/2016 1132      Component Value Date/Time   CALCIUM 9.8 10/17/2021 1121   ALKPHOS 48 10/17/2021 1121   AST 19 10/17/2021 1121   ALT 9 10/17/2021 1121   BILITOT 0.7 10/17/2021 1121       Impression and Plan: [name] is a very pleasant 71 yo African American gentleman with chronic ITP.  With his upcoming surgery on 11/28/2021, we will have him come in weekly for lab and Nplate.  Follow-up in 3 weeks.   Lottie Dawson, NP 10/6/202311:38 AM

## 2021-10-31 NOTE — Patient Instructions (Signed)
Romiplostim Injection What is this medication? ROMIPLOSTIM (roe mi PLOE stim) treats low levels of platelets in your body caused by immune thrombocytopenia (ITP). It is prescribed when other medications have not worked or cannot be tolerated. It may also be used to help people who have been exposed to high doses of radiation. It works by increasing the amount of platelets in your blood. This lowers the risk of bleeding. This medicine may be used for other purposes; ask your health care provider or pharmacist if you have questions. COMMON BRAND NAME(S): Nplate What should I tell my care team before I take this medication? They need to know if you have any of these conditions: Blood clots Myelodysplastic syndrome An unusual or allergic reaction to romiplostim, mannitol, other medications, foods, dyes, or preservatives Pregnant or trying to get pregnant Breast-feeding How should I use this medication? This medication is injected under the skin. It is given by a care team in a hospital or clinic setting. A special MedGuide will be given to you before each treatment. Be sure to read this information carefully each time. Talk to your care team about the use of this medication in children. While it may be prescribed for children as young as newborns for selected conditions, precautions do apply. Overdosage: If you think you have taken too much of this medicine contact a poison control center or emergency room at once. NOTE: This medicine is only for you. Do not share this medicine with others. What if I miss a dose? Keep appointments for follow-up doses. It is important not to miss your dose. Call your care team if you are unable to keep an appointment. What may interact with this medication? Interactions are not expected. This list may not describe all possible interactions. Give your health care provider a list of all the medicines, herbs, non-prescription drugs, or dietary supplements you use. Also  tell them if you smoke, drink alcohol, or use illegal drugs. Some items may interact with your medicine. What should I watch for while using this medication? Visit your care team for regular checks on your progress. You may need blood work done while you are taking this medication. Your condition will be monitored carefully while you are receiving this medication. It is important not to miss any appointments. What side effects may I notice from receiving this medication? Side effects that you should report to your care team as soon as possible: Allergic reactions--skin rash, itching, hives, swelling of the face, lips, tongue, or throat Blood clot--pain, swelling, or warmth in the leg, shortness of breath, chest pain Side effects that usually do not require medical attention (report to your care team if they continue or are bothersome): Dizziness Joint pain Muscle pain Pain in the hands or feet Stomach pain Trouble sleeping This list may not describe all possible side effects. Call your doctor for medical advice about side effects. You may report side effects to FDA at 1-800-FDA-1088. Where should I keep my medication? This medication is given in a hospital or clinic. It will not be stored at home. NOTE: This sheet is a summary. It may not cover all possible information. If you have questions about this medicine, talk to your doctor, pharmacist, or health care provider.  2023 Elsevier/Gold Standard (2021-04-22 00:00:00)  

## 2021-11-07 ENCOUNTER — Inpatient Hospital Stay: Payer: Medicare Other

## 2021-11-07 VITALS — BP 149/90 | HR 69 | Temp 98.3°F | Resp 18

## 2021-11-07 DIAGNOSIS — R202 Paresthesia of skin: Secondary | ICD-10-CM | POA: Diagnosis not present

## 2021-11-07 DIAGNOSIS — D693 Immune thrombocytopenic purpura: Secondary | ICD-10-CM

## 2021-11-07 DIAGNOSIS — Z79899 Other long term (current) drug therapy: Secondary | ICD-10-CM | POA: Diagnosis not present

## 2021-11-07 DIAGNOSIS — Z8679 Personal history of other diseases of the circulatory system: Secondary | ICD-10-CM | POA: Diagnosis not present

## 2021-11-07 DIAGNOSIS — D509 Iron deficiency anemia, unspecified: Secondary | ICD-10-CM

## 2021-11-07 DIAGNOSIS — R200 Anesthesia of skin: Secondary | ICD-10-CM | POA: Diagnosis not present

## 2021-11-07 LAB — CBC WITH DIFFERENTIAL (CANCER CENTER ONLY)
Abs Immature Granulocytes: 0.03 10*3/uL (ref 0.00–0.07)
Basophils Absolute: 0 10*3/uL (ref 0.0–0.1)
Basophils Relative: 1 %
Eosinophils Absolute: 0.2 10*3/uL (ref 0.0–0.5)
Eosinophils Relative: 4 %
HCT: 37.5 % — ABNORMAL LOW (ref 39.0–52.0)
Hemoglobin: 12.3 g/dL — ABNORMAL LOW (ref 13.0–17.0)
Immature Granulocytes: 1 %
Lymphocytes Relative: 18 %
Lymphs Abs: 1.1 10*3/uL (ref 0.7–4.0)
MCH: 29.8 pg (ref 26.0–34.0)
MCHC: 32.8 g/dL (ref 30.0–36.0)
MCV: 90.8 fL (ref 80.0–100.0)
Monocytes Absolute: 0.7 10*3/uL (ref 0.1–1.0)
Monocytes Relative: 11 %
Neutro Abs: 4.2 10*3/uL (ref 1.7–7.7)
Neutrophils Relative %: 65 %
Platelet Count: 82 10*3/uL — ABNORMAL LOW (ref 150–400)
RBC: 4.13 MIL/uL — ABNORMAL LOW (ref 4.22–5.81)
RDW: 14.2 % (ref 11.5–15.5)
WBC Count: 6.3 10*3/uL (ref 4.0–10.5)
nRBC: 0 % (ref 0.0–0.2)

## 2021-11-07 LAB — LACTATE DEHYDROGENASE: LDH: 261 U/L — ABNORMAL HIGH (ref 98–192)

## 2021-11-07 LAB — CMP (CANCER CENTER ONLY)
ALT: 8 U/L (ref 0–44)
AST: 21 U/L (ref 15–41)
Albumin: 4.9 g/dL (ref 3.5–5.0)
Alkaline Phosphatase: 46 U/L (ref 38–126)
Anion gap: 8 (ref 5–15)
BUN: 15 mg/dL (ref 8–23)
CO2: 29 mmol/L (ref 22–32)
Calcium: 9.8 mg/dL (ref 8.9–10.3)
Chloride: 101 mmol/L (ref 98–111)
Creatinine: 1.11 mg/dL (ref 0.61–1.24)
GFR, Estimated: >60 mL/min (ref >60)
Glucose, Bld: 110 mg/dL — ABNORMAL HIGH (ref 70–99)
Potassium: 3.6 mmol/L (ref 3.5–5.1)
Sodium: 138 mmol/L (ref 135–145)
Total Bilirubin: 0.8 mg/dL (ref 0.3–1.2)
Total Protein: 7.9 g/dL (ref 6.5–8.1)

## 2021-11-07 MED ORDER — ROMIPLOSTIM 125 MCG ~~LOC~~ SOLR
95.0000 ug | Freq: Once | SUBCUTANEOUS | Status: AC
  Administered 2021-11-07: 95 ug via SUBCUTANEOUS
  Filled 2021-11-07: qty 0.19

## 2021-11-07 NOTE — Patient Instructions (Signed)
Romiplostim Injection What is this medication? ROMIPLOSTIM (roe mi PLOE stim) treats low levels of platelets in your body caused by immune thrombocytopenia (ITP). It is prescribed when other medications have not worked or cannot be tolerated. It may also be used to help people who have been exposed to high doses of radiation. It works by increasing the amount of platelets in your blood. This lowers the risk of bleeding. This medicine may be used for other purposes; ask your health care provider or pharmacist if you have questions. COMMON BRAND NAME(S): Nplate What should I tell my care team before I take this medication? They need to know if you have any of these conditions: Blood clots Myelodysplastic syndrome An unusual or allergic reaction to romiplostim, mannitol, other medications, foods, dyes, or preservatives Pregnant or trying to get pregnant Breast-feeding How should I use this medication? This medication is injected under the skin. It is given by a care team in a hospital or clinic setting. A special MedGuide will be given to you before each treatment. Be sure to read this information carefully each time. Talk to your care team about the use of this medication in children. While it may be prescribed for children as young as newborns for selected conditions, precautions do apply. Overdosage: If you think you have taken too much of this medicine contact a poison control center or emergency room at once. NOTE: This medicine is only for you. Do not share this medicine with others. What if I miss a dose? Keep appointments for follow-up doses. It is important not to miss your dose. Call your care team if you are unable to keep an appointment. What may interact with this medication? Interactions are not expected. This list may not describe all possible interactions. Give your health care provider a list of all the medicines, herbs, non-prescription drugs, or dietary supplements you use. Also  tell them if you smoke, drink alcohol, or use illegal drugs. Some items may interact with your medicine. What should I watch for while using this medication? Visit your care team for regular checks on your progress. You may need blood work done while you are taking this medication. Your condition will be monitored carefully while you are receiving this medication. It is important not to miss any appointments. What side effects may I notice from receiving this medication? Side effects that you should report to your care team as soon as possible: Allergic reactions--skin rash, itching, hives, swelling of the face, lips, tongue, or throat Blood clot--pain, swelling, or warmth in the leg, shortness of breath, chest pain Side effects that usually do not require medical attention (report to your care team if they continue or are bothersome): Dizziness Joint pain Muscle pain Pain in the hands or feet Stomach pain Trouble sleeping This list may not describe all possible side effects. Call your doctor for medical advice about side effects. You may report side effects to FDA at 1-800-FDA-1088. Where should I keep my medication? This medication is given in a hospital or clinic. It will not be stored at home. NOTE: This sheet is a summary. It may not cover all possible information. If you have questions about this medicine, talk to your doctor, pharmacist, or health care provider.  2023 Elsevier/Gold Standard (2021-04-22 00:00:00)  

## 2021-11-14 ENCOUNTER — Inpatient Hospital Stay: Payer: Medicare Other

## 2021-11-14 ENCOUNTER — Other Ambulatory Visit: Payer: Medicare Other

## 2021-11-14 ENCOUNTER — Ambulatory Visit: Payer: Medicare Other

## 2021-11-14 VITALS — BP 136/78 | HR 58 | Temp 98.5°F | Resp 18

## 2021-11-14 DIAGNOSIS — Z79899 Other long term (current) drug therapy: Secondary | ICD-10-CM | POA: Diagnosis not present

## 2021-11-14 DIAGNOSIS — R200 Anesthesia of skin: Secondary | ICD-10-CM | POA: Diagnosis not present

## 2021-11-14 DIAGNOSIS — D509 Iron deficiency anemia, unspecified: Secondary | ICD-10-CM

## 2021-11-14 DIAGNOSIS — Z8679 Personal history of other diseases of the circulatory system: Secondary | ICD-10-CM | POA: Diagnosis not present

## 2021-11-14 DIAGNOSIS — D693 Immune thrombocytopenic purpura: Secondary | ICD-10-CM

## 2021-11-14 DIAGNOSIS — R202 Paresthesia of skin: Secondary | ICD-10-CM | POA: Diagnosis not present

## 2021-11-14 LAB — CBC WITH DIFFERENTIAL (CANCER CENTER ONLY)
Abs Immature Granulocytes: 0.04 10*3/uL (ref 0.00–0.07)
Basophils Absolute: 0 10*3/uL (ref 0.0–0.1)
Basophils Relative: 0 %
Eosinophils Absolute: 0.2 10*3/uL (ref 0.0–0.5)
Eosinophils Relative: 2 %
HCT: 38 % — ABNORMAL LOW (ref 39.0–52.0)
Hemoglobin: 12.4 g/dL — ABNORMAL LOW (ref 13.0–17.0)
Immature Granulocytes: 1 %
Lymphocytes Relative: 16 %
Lymphs Abs: 1.1 10*3/uL (ref 0.7–4.0)
MCH: 29.9 pg (ref 26.0–34.0)
MCHC: 32.6 g/dL (ref 30.0–36.0)
MCV: 91.6 fL (ref 80.0–100.0)
Monocytes Absolute: 0.9 10*3/uL (ref 0.1–1.0)
Monocytes Relative: 13 %
Neutro Abs: 4.5 10*3/uL (ref 1.7–7.7)
Neutrophils Relative %: 68 %
Platelet Count: 138 10*3/uL — ABNORMAL LOW (ref 150–400)
RBC: 4.15 MIL/uL — ABNORMAL LOW (ref 4.22–5.81)
RDW: 13.9 % (ref 11.5–15.5)
WBC Count: 6.6 10*3/uL (ref 4.0–10.5)
nRBC: 0 % (ref 0.0–0.2)

## 2021-11-14 LAB — CMP (CANCER CENTER ONLY)
ALT: 10 U/L (ref 0–44)
AST: 21 U/L (ref 15–41)
Albumin: 4.9 g/dL (ref 3.5–5.0)
Alkaline Phosphatase: 47 U/L (ref 38–126)
Anion gap: 9 (ref 5–15)
BUN: 17 mg/dL (ref 8–23)
CO2: 30 mmol/L (ref 22–32)
Calcium: 10 mg/dL (ref 8.9–10.3)
Chloride: 102 mmol/L (ref 98–111)
Creatinine: 1.22 mg/dL (ref 0.61–1.24)
GFR, Estimated: >60 mL/min (ref >60)
Glucose, Bld: 117 mg/dL — ABNORMAL HIGH (ref 70–99)
Potassium: 4.3 mmol/L (ref 3.5–5.1)
Sodium: 141 mmol/L (ref 135–145)
Total Bilirubin: 0.9 mg/dL (ref 0.3–1.2)
Total Protein: 7.8 g/dL (ref 6.5–8.1)

## 2021-11-14 LAB — LACTATE DEHYDROGENASE: LDH: 262 U/L — ABNORMAL HIGH (ref 98–192)

## 2021-11-14 MED ORDER — ROMIPLOSTIM 125 MCG ~~LOC~~ SOLR
95.0000 ug | Freq: Once | SUBCUTANEOUS | Status: AC
  Administered 2021-11-14: 95 ug via SUBCUTANEOUS
  Filled 2021-11-14: qty 0.19

## 2021-11-14 NOTE — Patient Instructions (Signed)
Romiplostim Injection What is this medication? ROMIPLOSTIM (roe mi PLOE stim) treats low levels of platelets in your body caused by immune thrombocytopenia (ITP). It is prescribed when other medications have not worked or cannot be tolerated. It may also be used to help people who have been exposed to high doses of radiation. It works by increasing the amount of platelets in your blood. This lowers the risk of bleeding. This medicine may be used for other purposes; ask your health care provider or pharmacist if you have questions. COMMON BRAND NAME(S): Nplate What should I tell my care team before I take this medication? They need to know if you have any of these conditions: Blood clots Myelodysplastic syndrome An unusual or allergic reaction to romiplostim, mannitol, other medications, foods, dyes, or preservatives Pregnant or trying to get pregnant Breast-feeding How should I use this medication? This medication is injected under the skin. It is given by a care team in a hospital or clinic setting. A special MedGuide will be given to you before each treatment. Be sure to read this information carefully each time. Talk to your care team about the use of this medication in children. While it may be prescribed for children as young as newborns for selected conditions, precautions do apply. Overdosage: If you think you have taken too much of this medicine contact a poison control center or emergency room at once. NOTE: This medicine is only for you. Do not share this medicine with others. What if I miss a dose? Keep appointments for follow-up doses. It is important not to miss your dose. Call your care team if you are unable to keep an appointment. What may interact with this medication? Interactions are not expected. This list may not describe all possible interactions. Give your health care provider a list of all the medicines, herbs, non-prescription drugs, or dietary supplements you use. Also  tell them if you smoke, drink alcohol, or use illegal drugs. Some items may interact with your medicine. What should I watch for while using this medication? Visit your care team for regular checks on your progress. You may need blood work done while you are taking this medication. Your condition will be monitored carefully while you are receiving this medication. It is important not to miss any appointments. What side effects may I notice from receiving this medication? Side effects that you should report to your care team as soon as possible: Allergic reactions--skin rash, itching, hives, swelling of the face, lips, tongue, or throat Blood clot--pain, swelling, or warmth in the leg, shortness of breath, chest pain Side effects that usually do not require medical attention (report to your care team if they continue or are bothersome): Dizziness Joint pain Muscle pain Pain in the hands or feet Stomach pain Trouble sleeping This list may not describe all possible side effects. Call your doctor for medical advice about side effects. You may report side effects to FDA at 1-800-FDA-1088. Where should I keep my medication? This medication is given in a hospital or clinic. It will not be stored at home. NOTE: This sheet is a summary. It may not cover all possible information. If you have questions about this medicine, talk to your doctor, pharmacist, or health care provider.  2023 Elsevier/Gold Standard (2021-04-22 00:00:00)  

## 2021-11-18 ENCOUNTER — Other Ambulatory Visit: Payer: Self-pay

## 2021-11-18 ENCOUNTER — Encounter (HOSPITAL_COMMUNITY): Payer: Self-pay

## 2021-11-18 ENCOUNTER — Encounter (HOSPITAL_COMMUNITY)
Admission: RE | Admit: 2021-11-18 | Discharge: 2021-11-18 | Disposition: A | Discharge status: Home / Self Care | Payer: Medicare Other | Source: Ambulatory Visit | Attending: Vascular Surgery | Admitting: Vascular Surgery

## 2021-11-18 VITALS — BP 139/85 | HR 57 | Temp 98.4°F | Resp 18 | Ht 69.0 in | Wt 195.7 lb

## 2021-11-18 DIAGNOSIS — Z01812 Encounter for preprocedural laboratory examination: Secondary | ICD-10-CM | POA: Diagnosis not present

## 2021-11-18 DIAGNOSIS — Z5189 Encounter for other specified aftercare: Secondary | ICD-10-CM | POA: Diagnosis not present

## 2021-11-18 DIAGNOSIS — I714 Abdominal aortic aneurysm, without rupture, unspecified: Secondary | ICD-10-CM | POA: Diagnosis not present

## 2021-11-18 DIAGNOSIS — Z01818 Encounter for other preprocedural examination: Secondary | ICD-10-CM

## 2021-11-18 HISTORY — DX: Immune thrombocytopenic purpura: D69.3

## 2021-11-18 LAB — CBC
HCT: 37.2 % — ABNORMAL LOW (ref 39.0–52.0)
Hemoglobin: 12.5 g/dL — ABNORMAL LOW (ref 13.0–17.0)
MCH: 30.6 pg (ref 26.0–34.0)
MCHC: 33.6 g/dL (ref 30.0–36.0)
MCV: 91.2 fL (ref 80.0–100.0)
Platelets: 144 10*3/uL — ABNORMAL LOW (ref 150–400)
RBC: 4.08 MIL/uL — ABNORMAL LOW (ref 4.22–5.81)
RDW: 13.9 % (ref 11.5–15.5)
WBC: 7.1 10*3/uL (ref 4.0–10.5)
nRBC: 0 % (ref 0.0–0.2)

## 2021-11-18 LAB — COMPREHENSIVE METABOLIC PANEL
ALT: 11 U/L (ref 0–44)
AST: 27 U/L (ref 15–41)
Albumin: 4.5 g/dL (ref 3.5–5.0)
Alkaline Phosphatase: 45 U/L (ref 38–126)
Anion gap: 8 (ref 5–15)
BUN: 9 mg/dL (ref 8–23)
CO2: 24 mmol/L (ref 22–32)
Calcium: 9.3 mg/dL (ref 8.9–10.3)
Chloride: 107 mmol/L (ref 98–111)
Creatinine, Ser: 1.04 mg/dL (ref 0.61–1.24)
GFR, Estimated: >60 mL/min (ref >60)
Glucose, Bld: 104 mg/dL — ABNORMAL HIGH (ref 70–99)
Potassium: 3.9 mmol/L (ref 3.5–5.1)
Sodium: 139 mmol/L (ref 135–145)
Total Bilirubin: 0.7 mg/dL (ref 0.3–1.2)
Total Protein: 7.6 g/dL (ref 6.5–8.1)

## 2021-11-18 LAB — APTT: aPTT: 30 seconds (ref 24–36)

## 2021-11-18 LAB — PROTIME-INR
INR: 1.1 (ref 0.8–1.2)
Prothrombin Time: 14.5 seconds (ref 11.4–15.2)

## 2021-11-18 LAB — URINALYSIS, ROUTINE W REFLEX MICROSCOPIC
Bacteria, UA: NONE SEEN
Bilirubin Urine: NEGATIVE
Glucose, UA: NEGATIVE mg/dL
Ketones, ur: NEGATIVE mg/dL
Leukocytes,Ua: NEGATIVE
Nitrite: NEGATIVE
Protein, ur: NEGATIVE mg/dL
Specific Gravity, Urine: 1.004 — ABNORMAL LOW (ref 1.005–1.030)
pH: 6 (ref 5.0–8.0)

## 2021-11-18 LAB — SURGICAL PCR SCREEN
MRSA, PCR: NEGATIVE
Staphylococcus aureus: NEGATIVE

## 2021-11-18 NOTE — Progress Notes (Signed)
PCP - Dr. Bevelyn Buckles Cardiologist - Dr. Peter Martinique  PPM/ICD - n/a Device Orders - n/a Rep Notified - n/a  Chest x-ray - 08/20/21 EKG - 08/20/21 Stress Test - 09/06/20 ECHO - 09/16/20 Cardiac Cath - 04/16/16  Sleep Study - +OSA CPAP - Patient states he only uses CPAP when he is congested  Fasting Blood Sugar - n/a Checks Blood Sugar _____ times a day- n/a  Last dose of GLP1 agonist-  n/a GLP1 instructions: n/a  Blood Thinner Instructions: n/a Aspirin Instructions: n/a  ERAS Protcol - NPO PRE-SURGERY Ensure or G2- None ordered  COVID TEST- n/a   Anesthesia review: Yes. Cardiac History. Clearance note 10/21/21.  Patient denies shortness of breath, fever, cough and chest pain at PAT appointment   All instructions explained to the patient, with a verbal understanding of the material. Patient agrees to go over the instructions while at home for a better understanding. Patient also instructed to self quarantine after being tested for COVID-19. The opportunity to ask questions was provided.

## 2021-11-18 NOTE — Progress Notes (Signed)
Received a call from the blood bank that patient's sample has antibodies and another Type and Screen needs to be drawn on the day of surgery. Orders entered for Type and Screen on day of surgery.

## 2021-11-18 NOTE — Pre-Procedure Instructions (Signed)
Surgical Instructions    Your procedure is scheduled on Friday 11/28/21.   Report to [facility] Main Entrance "A" at 05:30 A.M., then check in with the Admitting office.  Call this number if you have problems the morning of surgery:  [phone]   If you have any questions prior to your surgery date call [phone]: Open Monday-Friday 8am-4pm If you experience any cold or flu symptoms such as cough, fever, chills, shortness of breath, etc. between now and your scheduled surgery, please notify us at the above number     Remember:  Do not eat or drink after midnight the night before your surgery    Take these medicines the morning of surgery with A SIP OF WATER:   amLODipine (NORVASC)   atenolol (TENORMIN)  latanoprost (XALATAN)  pantoprazole (PROTONIX)   rosuvastatin (CRESTOR)    acetaminophen (TYLENOL)- If needed  fluticasone (FLONASE)- If needed   As of today, STOP taking any Aspirin (unless otherwise instructed by your surgeon) Aleve, Naproxen, Ibuprofen, Motrin, Advil, Goody's, BC's, all herbal medications, fish oil, and all vitamins.           Do not wear jewelry or makeup. Do not wear lotions, powders, perfumes/cologne or deodorant. Do not shave 48 hours prior to surgery.  Men may shave face and neck. Do not bring valuables to the hospital. Do not wear nail polish, gel polish, artificial nails, or any other type of covering on natural nails (fingers and toes) If you have artificial nails or gel coating that need to be removed by a nail salon, please have this removed prior to surgery. Artificial nails or gel coating may interfere with anesthesia's ability to adequately monitor your vital signs.  [facility] is not responsible for any belongings or valuables.    Do NOT Smoke (Tobacco/Vaping)  24 hours prior to your procedure  If you use a CPAP at night, you may bring your mask for your overnight stay.   Contacts, glasses, hearing aids, dentures or partials may not be  worn into surgery, please bring cases for these belongings   For patients admitted to the hospital, discharge time will be determined by your treatment team.   Patients discharged the day of surgery will not be allowed to drive home, and someone needs to stay with them for 24 hours.   SURGICAL WAITING ROOM VISITATION Patients having surgery or a procedure may have no more than 2 support people in the waiting area - these visitors may rotate.   Children under the age of 91 must have an adult with them who is not the patient. If the patient needs to stay at the hospital during part of their recovery, the visitor guidelines for inpatient rooms apply. Pre-op nurse will coordinate an appropriate time for 1 support person to accompany patient in pre-op.  This support person may not rotate.   Please refer to [email or web address] for the visitor guidelines for Inpatients (after your surgery is over and you are in a regular room).    Special instructions:    Oral Hygiene is also important to reduce your risk of infection.  Remember - BRUSH YOUR TEETH THE MORNING OF SURGERY WITH YOUR REGULAR TOOTHPASTE   [facility]- Preparing For Surgery  Before surgery, you can play an important role. Because skin is not sterile, your skin needs to be as free of germs as possible. You can reduce the number of germs on your skin by washing with CHG (chlorahexidine gluconate) Soap before surgery.  CHG  is an antiseptic cleaner which kills germs and bonds with the skin to continue killing germs even after washing.     Please do not use if you have an allergy to CHG or antibacterial soaps. If your skin becomes reddened/irritated stop using the CHG.  Do not shave (including legs and underarms) for at least 48 hours prior to first CHG shower. It is OK to shave your face.  Please follow these instructions carefully.     Shower the NIGHT BEFORE SURGERY and the MORNING  OF SURGERY with CHG Soap.   If you chose to wash your hair, wash your hair first as usual with your normal shampoo. After you shampoo, rinse your hair and body thoroughly to remove the shampoo.  Then [employer] and genitals (private parts) with your normal soap and rinse thoroughly to remove soap.  After that Use CHG Soap as you would any other liquid soap. You can apply CHG directly to the skin and wash gently with a scrungie or a clean washcloth.   Apply the CHG Soap to your body ONLY FROM THE NECK DOWN.  Do not use on open wounds or open sores. Avoid contact with your eyes, ears, mouth and genitals (private parts). Wash Face and genitals (private parts)  with your normal soap.   Wash thoroughly, paying special attention to the area where your surgery will be performed.  Thoroughly rinse your body with warm water from the neck down.  DO NOT shower/wash with your normal soap after using and rinsing off the CHG Soap.  Pat yourself dry with a CLEAN TOWEL.  Wear CLEAN PAJAMAS to bed the night before surgery  Place CLEAN SHEETS on your bed the night before your surgery  DO NOT SLEEP WITH PETS.   Day of Surgery:  Take a shower with CHG soap. Wear Clean/Comfortable clothing the morning of surgery Do not apply any deodorants/lotions.   Remember to brush your teeth WITH YOUR REGULAR TOOTHPASTE.    If you received a COVID test during your pre-op visit, it is requested that you wear a mask when out in public, stay away from anyone that may not be feeling well, and notify your surgeon if you develop symptoms. If you have been in contact with anyone that has tested positive in the last 10 days, please notify your surgeon.    Please read over the following fact sheets that you were given.

## 2021-11-19 LAB — TYPE AND SCREEN
ABO/RH(D): AB POS
Antibody Screen: POSITIVE
Donor AG Type: NEGATIVE
Donor AG Type: NEGATIVE
PT AG Type: NEGATIVE
Unit division: 0
Unit division: 0
Unit division: 0

## 2021-11-19 LAB — BPAM RBC
Blood Product Expiration Date: 202311092359
Blood Product Expiration Date: 202311092359
Blood Product Expiration Date: 202311192359
ISSUE DATE / TIME: 202310180849
Unit Type and Rh: 6200
Unit Type and Rh: 6200
Unit Type and Rh: 6200

## 2021-11-19 NOTE — Progress Notes (Signed)
Anesthesia Chart Review:  Case: [id number] Date/Time: 11/28/21 0714   Procedure: ABDOMINAL AORTIC ENDOVASCULAR STENT GRAFT   Anesthesia type: General   Pre-op diagnosis: AAA   Location: [place] OR ROOM 16 / [place] [facility]   Surgeons: Waynetta Sandy, MD       DISCUSSION: Patient is a 71 year old male scheduled for the above procedure.  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 10/13/21 CTA), carotid artery disease (mild 1-39% BICA 08/2016), GI bleed (hemorrhagic shock from diverticular bleed 04/2020; recurrent diverticular bleed 04/2021, s/p coil embolization of the marginal artery local to vasa recta at hemorrhage site 05/26/21; 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   Last cardiology visit was on 10/21/21 with Coletta Memos, NP. He wrote, "Given past medical history and time since last visit, based on ACC/AHA guidelines, Lonnie Hansen would be at acceptable risk for the planned procedure without further cardiovascular testing...  His RCRI is a class III risk, 6.6% risk of major cardiac event.  He is  able to complete greater than 4 METS of physical activity."  Per 11/20/21 hematology note, he will "come in weekly for lab and Nplate) given vascular surgery on 11/28/21. Currently last Nplate infusion 56/21/30. PLT count 144, H/H 12.5/37.2 on 11/18/21.  Anesthesia team to evaluate on the day of surgery. Per Blood Bank, he needs another Blood Bank specimen on the day of surgery due to antibodies.    VS: BP 139/85   Pulse (!) 57   Temp 36.9 C (Oral)   Resp 18   Ht '5\' 9"'$  (1.753 m)   Wt 88.8 kg   SpO2 100%   BMI 28.90 kg/m    PROVIDERS: [name], MD is PCP  Martinique, Peter, MD is cardiologist Gilford Raid, MD is CT surgeon (TAA). Last visit 06/18/21 with one year follow-up CTA chest recommended for re-evaluation of TAA beyond aorta graft. Servando Snare, MD is vascular surgeon Corrie Mckusick, DO is IR Arta Silence, MD is GI Burney Gauze, MD is HEM.  Last visit 10/31/21 with Lottie Dawson, NP. He receives Nplate for chronic ITP. Last infusion seen 11/14/21.    LABS: Labs reviewed: Acceptable for surgery. (all labs ordered are listed, but only abnormal results are displayed)  Labs Reviewed  CBC - Abnormal; Notable for the following components:      Result Value   RBC 4.08 (*)    Hemoglobin 12.5 (*)    HCT 37.2 (*)    Platelets 144 (*)    All other components within normal limits  COMPREHENSIVE METABOLIC PANEL - Abnormal; Notable for the following components:   Glucose, Bld 104 (*)    All other components within normal limits  URINALYSIS, ROUTINE W REFLEX MICROSCOPIC - Abnormal; Notable for the following components:   Color, Urine STRAW (*)  Specific Gravity, Urine 1.004 (*)    Hgb urine dipstick MODERATE (*)    All other components within normal limits  SURGICAL PCR SCREEN  PROTIME-INR  APTT  TYPE AND SCREEN     IMAGES: CTA Abd Aorta with Iliofemoral Runoff 10/13/21: IMPRESSION: Aneurysm disease, including: -infrarenal abdominal aortic aneurysm  estimated 5.3 cm Aortic aneurysm NOS (ICD10-I71.9). 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 No high-grade stenosis or occlusion. Aortic Atherosclerosis (ICD10-I70.0). - There is relative under filling of the right-sided tibial arteries distally as compared to the left, which may indicate late arrival of the contrast bolus versus distal occlusive disease. - Additional ancillary findings as above [see full report]  CTA Chest/Aorta 06/18/21: IMPRESSION: 1. 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 Recommend semi-annual imaging followup by CTA or MRA. This recommendation follows 2010 ACCF/AHA/AATS/ACR/ASA/SCA/SCAI/SIR/STS/SVM Guidelines for the Diagnosis and Management of Patients With Thoracic Aortic Disease. Circulation. 2010; 121: A630-Z60. Aortic aneurysm NOS (ICD10-I71.9) 2. Prior ascending aortic repair.   EKG: 08/19/21: Normal sinus rhythm Nonspecific ST abnormality Abnormal ECG Since last tracing rate faster Confirmed by Kathlyn Sacramento [phone]) on 08/20/2021 8:42:33 AM   CV: Echo 09/16/20: IMPRESSIONS   1. Left ventricular ejection fraction, by estimation, is 65 to 70%. The  left ventricle has normal function. The left ventricle has no regional  wall motion abnormalities. There is mild left ventricular hypertrophy.  Left ventricular diastolic parameters  were normal.   2. Right ventricular systolic function is normal. The right ventricular  size is normal.   3. Mild mitral valve regurgitation.   4. 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 No  significant change from  report of April 2021. . The aortic valve has been  repaired/replaced. Aortic valve  regurgitation is not visualized.    Nuclear stress test 09/06/20: The left ventricular ejection fraction is moderately decreased (30-44%). Nuclear stress EF: 42%. There was no ST segment deviation noted during stress. Defect 1: There is a medium defect of moderate severity present in the basal inferior, mid inferior and apex location. Defect 2: There is a small defect of moderate severity present in the apical inferior and apical lateral location. Findings consistent with ischemia. This is an intermediate risk study. Abnormal study. TID is 1.21, cannot exclude balanced ischemia. There is a fixed defect in the inferior wall and apex that is moderately severe, with mild-moderate hypokinesis in this area. There is a separate small reversible defect of moderate severity in the apical and inferior regions suggestive of ischemia. EF is moderately decreased, though would consider echo for definitive evaluation of function and wall motion. - Per 09/11/20 note by Dr. Peter Martinique, "80% mid nondominant RCA and a 50% distal LAD lesion on previous cardiac catheterization in 2018.  Abnormal myoview with some inferior and apical ischemia. This was an intermediate risk study. Ordinarily would consider repeat coronary angiography but his bleeding risk is high with ITP and recurrent major lower GI bleeds. He is a poor candidate for PCI because of this. I would not recommend invasive evaluation unless he were to have progressive angina despite medical therapy."   Long term monitor 09/02/20-09/06/20: Normal sinus rhythm. no significant bradycardia or  pauses One 5 beat run PAT. otherwise rare PAC Occasional PVCs    US Carotid 09/11/16: Summary:  Findings are consistent with a 1-39 percent stenosis involving the  right internal carotid artery and the left internal carotid artery.  The vertebral arteries demonstrate antegrade flow.     RHC/LHC 04/16/16 (Pre-Bentall/AVR):  Mid RCA lesion, 80 %stenosed. Ost LAD to Prox LAD lesion, 25 %stenosed. Dist LAD lesion, 50 %stenosed. There is mild left ventricular systolic dysfunction. LV end diastolic pressure is normal. The left ventricular ejection fraction is 45-50% by visual estimate. There is no mitral valve regurgitation. There is no aortic valve stenosis. LV end diastolic pressure is normal. 1. 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 Mild LV dysfunction. EF 45%. 3. Ascending thoracic aortic aneurysm. 4. 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 5. Normal Right heart  And LV filling pressures 6. Normal cardiac output.  Plan: Aortic root grafting and AVR.    Past Medical History:  Diagnosis Date   AAA (abdominal aortic aneurysm) (HCC)    Anemia    Arthritis    Asymptomatic bilateral carotid artery stenosis 08/2015   1-39%    Cataracts, bilateral    Chronic ITP (idiopathic thrombocytopenia) ([place]) 03/31/2018   Coronary artery disease    Diverticulosis    Enlarged aorta (HCC)    Enlarged prostate    slightly   GERD (gastroesophageal reflux disease)    takes Pantoprazole daily as needed   Glaucoma    uses eye drops daily   Headache    History of colon polyps    benign   History of kidney stones    Hyperlipidemia    no on any meds   Hypertension    takes Amlodipine and Atenolol daily   Idiopathic thrombocytopenia (HCC)    OSA on CPAP    Vocal cord nodule    pt. states  it's a" growth on vocal cord"    Past Surgical History:  Procedure Laterality Date   AORTIC ARCH ANGIOGRAPHY N/A 04/16/2016   Procedure: Aortic Arch Angiography;  Surgeon: [name] Martinique, MD;  Location: [place];  Service: Cardiovascular;  Laterality: N/A;   AORTIC VALVE REPLACEMENT N/A 09/15/2016   Procedure: AORTIC VALVE REPLACEMENT (AVR);  Surgeon: Grace Isaac, MD;  Location: [place];  Service: Open Heart Surgery;  Laterality: N/A;  Using 81m Edwards  Perimount Magna Ease Aortic Bioprosthesis Valve   ASCENDING AORTIC ROOT REPLACEMENT N/A 09/15/2016   Procedure: ASCENDING AORTIC ROOT REPLACEMENT;  Surgeon: GGrace Isaac MD;  [facility]  Service: Open Heart Surgery;  Laterality: N/A;  Using 366mGelweave Valsalva Graft   CHOLECYSTECTOMY N/A 12/19/2020   Procedure: LAPAROSCOPIC CHOLECYSTECTOMY WITH POSSIBLE INTRAOPERATIVE CHOLANGIOGRAM;  Surgeon: WiGreer PickerelMD;  [facility] Service: General;  Laterality: N/A;   COLONOSCOPY     COLONOSCOPY Left 04/16/2019   Procedure: COLONOSCOPY;  Surgeon: OuArta SilenceMD;  [facility];  Service: Endoscopy;  Laterality: Left;   COLONOSCOPY N/A 06/01/2020   Procedure: COLONOSCOPY;  Surgeon: KaRonnette JuniperMD;  [facility] Service: Gastroenterology;  Laterality: N/A;   COLONOSCOPY WITH ESOPHAGOGASTRODUODENOSCOPY (EGD)     COLONOSCOPY WITH PROPOFOL N/A 05/14/2021   Procedure: COLONOSCOPY WITH PROPOFOL;  Surgeon: KaRonnette JuniperMD;  [facility];  Service: Gastroenterology;  Laterality: N/A;   ERCP N/A 08/19/2021   Procedure: ENDOSCOPIC RETROGRADE CHOLANGIOPANCREATOGRAPHY (ERCP);  Surgeon: MaClarene EssexMD;  [facility];  Service: Gastroenterology;  Laterality: N/A;  at  1:30 pm   ESOPHAGOGASTRODUODENOSCOPY N/A 05/25/2020   Procedure: ESOPHAGOGASTRODUODENOSCOPY (EGD);  Surgeon: Wilford Corner, MD;  Location: [place];  Service: Endoscopy;  Laterality: N/A;   ESOPHAGOGASTRODUODENOSCOPY N/A 08/19/2021   Procedure: ESOPHAGOGASTRODUODENOSCOPY (EGD);  Surgeon: Otis Brace, MD;  Location[facility];  Service: Gastroenterology;  Laterality: N/A;   ESOPHAGOGASTRODUODENOSCOPY (EGD) WITH PROPOFOL Left 04/16/2019   Procedure: ESOPHAGOGASTRODUODENOSCOPY (EGD) WITH PROPOFOL;  Surgeon: Arta Silence, MD;  Location: [facility] ENDOSCOPY;  Service: Endoscopy;  Laterality: Left;   ESOPHAGOGASTRODUODENOSCOPY (EGD) WITH PROPOFOL N/A 08/23/2021   Procedure:  ESOPHAGOGASTRODUODENOSCOPY (EGD) WITH PROPOFOL;  Surgeon: Ronnette Juniper, MD;  [facility];  Service: Gastroenterology;  Laterality: N/A;   FLEXIBLE SIGMOIDOSCOPY N/A 05/25/2020   Procedure: FLEXIBLE SIGMOIDOSCOPY;  Surgeon: Wilford Corner, MD;  Location: [place];  Service: Endoscopy;  Laterality: N/A;   HEMOSTASIS CONTROL  08/19/2021   Procedure: HEMOSTASIS CONTROL;  Surgeon: Otis Brace, MD;  [facility];  Service: Gastroenterology;;   IR [place] VESSEL  05/26/2021   IR ANGIOGRAM SELECTIVE EACH ADDITIONAL VESSEL  08/20/2021   IR ANGIOGRAM SELECTIVE EACH ADDITIONAL VESSEL  08/20/2021   IR ANGIOGRAM VISCERAL SELECTIVE  05/26/2021   IR ANGIOGRAM VISCERAL SELECTIVE  08/20/2021   IR EMBO ART  VEN HEMORR LYMPH EXTRAV  INC GUIDE ROADMAPPING  05/26/2021   IR EMBO ART  VEN HEMORR LYMPH EXTRAV  INC GUIDE ROADMAPPING  08/20/2021   IR FLUORO GUIDE CV LINE RIGHT  08/20/2021   IR THORACENTESIS ASP PLEURAL SPACE W/IMG GUIDE  10/23/2016   IR US GUIDE VASC ACCESS LEFT  05/26/2021   IR US GUIDE VASC ACCESS RIGHT  05/26/2021   IR US GUIDE VASC ACCESS RIGHT  08/20/2021   IR US GUIDE VASC ACCESS RIGHT  08/20/2021   MICROLARYNGOSCOPY Right 09/20/2017   Procedure: MICROLARYNGOSCOPY WITH  EXCISION OF VOCAL CORD LESION;  Surgeon: Leta Baptist, MD;  Location: [facility];  Service: ENT;  Laterality: Right;   MULTIPLE EXTRACTIONS WITH ALVEOLOPLASTY N/A 06/10/2016   Procedure: Extraction of tooth #'s 2,8,13,15, and 29  with alveoloplasty, maxillary right and left buccal exostoses reductions, and gross debridement of remaining teeth.;  Surgeon: Lenn Cal, DDS;  Location: [place];  Service: Oral Surgery;  Laterality: N/A;   POLYPECTOMY  05/14/2021   Procedure: POLYPECTOMY;  Surgeon: Ronnette Juniper, MD;  Location: [facility];  Service: Gastroenterology;;   REMOVAL OF STONES  08/19/2021   Procedure: REMOVAL OF STONES;  Surgeon: Clarene Essex, MD;  [facility];   Service: Gastroenterology;;   RIGHT/LEFT HEART CATH AND CORONARY ANGIOGRAPHY N/A 04/16/2016   Procedure: Right/Left Heart Cath and Coronary Angiography;  Surgeon: [name] Martinique, MD;  Location: [place] CV LAB;  Service: Cardiovascular;  Laterality: N/A;   SPHINCTEROTOMY  08/19/2021   Procedure: SPHINCTEROTOMY;  Surgeon: Clarene Essex, MD;  [facility];  Service: Gastroenterology;;   TEE WITHOUT CARDIOVERSION N/A 09/15/2016   Procedure: TRANSESOPHAGEAL ECHOCARDIOGRAM (TEE);  Surgeon: Grace Isaac, MD;  Location: [place];  Service: Open Heart Surgery;  Laterality: N/A;    MEDICATIONS:  acetaminophen (TYLENOL) 500 MG tablet   amLODipine (NORVASC) 10 MG tablet   atenolol (TENORMIN) 25 MG tablet   fluticasone (FLONASE) 50 MCG/ACT nasal spray   latanoprost (XALATAN) 0.005 % ophthalmic solution   Multiple Vitamin (MULTIVITAMIN WITH MINERALS) TABS tablet   omega-3 acid ethyl esters (LOVAZA) 1 g capsule   pantoprazole (PROTONIX) 40 MG tablet   polyethylene glycol (MIRALAX / GLYCOLAX) 17 g packet   psyllium (  HYDROCIL/METAMUCIL) 95 % PACK   rosuvastatin (CRESTOR) 20 MG tablet   triamcinolone (KENALOG) 0.025 % cream   No current facility-administered medications for this encounter.    Myra Gianotti, PA-C Surgical Short Stay/Anesthesiology [facility] Phone [phone] [facility] Phone [phone] 11/19/2021 6:01 PM

## 2021-11-19 NOTE — Anesthesia Preprocedure Evaluation (Addendum)
Anesthesia Evaluation  Patient identified by MRN, date of birth, ID band Patient awake    Reviewed: Allergy & Precautions, H&P , NPO status , Patient's Chart, lab work & pertinent test results  Airway Mallampati: II  TM Distance: >3 FB Neck ROM: Full    Dental no notable dental hx.    Pulmonary sleep apnea , former smoker   Pulmonary exam normal breath sounds clear to auscultation       Cardiovascular hypertension, + CAD and + Peripheral Vascular Disease  Normal cardiovascular exam Rhythm:Regular Rate:Normal  Echo 09/16/20 1. Left ventricular ejection fraction, by estimation, is 65 to 70%. The  left ventricle has normal function. The left ventricle has no regional  wall motion abnormalities. There is mild left ventricular hypertrophy.  Left ventricular diastolic parameters  were normal.   2. Right ventricular systolic function is normal. The right ventricular  size is normal.   3. Mild mitral valve regurgitation.   4. 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 No  significant change from report of April 2021. . The aortic valve has been  repaired/replaced. Aortic valve  regurgitation is not visualized. )      Neuro/Psych negative neurological ROS  negative psych ROS   GI/Hepatic Neg liver ROS,GERD  ,,  Endo/Other  negative endocrine ROS    Renal/GU Renal InsufficiencyRenal disease  negative genitourinary   Musculoskeletal negative musculoskeletal ROS (+)    Abdominal   Peds negative pediatric ROS (+)  Hematology  (+) Blood dyscrasia, anemia   Anesthesia Other Findings   Reproductive/Obstetrics negative OB ROS                             Anesthesia Physical Anesthesia Plan  ASA: 3  Anesthesia Plan: General   Post-op Pain Management: Minimal or no pain anticipated   Induction:  Intravenous  PONV Risk Score and Plan: 2 and Ondansetron, Dexamethasone and Treatment may vary due to age or medical condition  Airway Management Planned: Oral ETT  Additional Equipment:   Intra-op Plan:   Post-operative Plan: Extubation in OR  Informed Consent: I have reviewed the patients History and Physical, chart, labs and discussed the procedure including the risks, benefits and alternatives for the proposed anesthesia with the patient or authorized representative who has indicated his/her understanding and acceptance.     Dental advisory given  Plan Discussed with: CRNA and Surgeon  Anesthesia Plan Comments: (PAT note written 11/19/2021 by Myra Gianotti, PA-C. )        Anesthesia Quick Evaluation

## 2021-11-21 ENCOUNTER — Inpatient Hospital Stay: Payer: Medicare Other

## 2021-11-21 ENCOUNTER — Telehealth: Payer: Self-pay | Admitting: *Deleted

## 2021-11-21 ENCOUNTER — Inpatient Hospital Stay (HOSPITAL_BASED_OUTPATIENT_CLINIC_OR_DEPARTMENT_OTHER): Payer: Medicare Other | Admitting: Family

## 2021-11-21 VITALS — BP 146/83 | HR 57 | Resp 17 | Ht 69.0 in | Wt 195.0 lb

## 2021-11-21 DIAGNOSIS — R200 Anesthesia of skin: Secondary | ICD-10-CM | POA: Diagnosis not present

## 2021-11-21 DIAGNOSIS — D509 Iron deficiency anemia, unspecified: Secondary | ICD-10-CM | POA: Diagnosis not present

## 2021-11-21 DIAGNOSIS — Z8679 Personal history of other diseases of the circulatory system: Secondary | ICD-10-CM | POA: Diagnosis not present

## 2021-11-21 DIAGNOSIS — Z79899 Other long term (current) drug therapy: Secondary | ICD-10-CM | POA: Diagnosis not present

## 2021-11-21 DIAGNOSIS — D693 Immune thrombocytopenic purpura: Secondary | ICD-10-CM

## 2021-11-21 DIAGNOSIS — R202 Paresthesia of skin: Secondary | ICD-10-CM | POA: Diagnosis not present

## 2021-11-21 LAB — CMP (CANCER CENTER ONLY)
ALT: 8 U/L (ref 0–44)
AST: 18 U/L (ref 15–41)
Albumin: 4.7 g/dL (ref 3.5–5.0)
Alkaline Phosphatase: 41 U/L (ref 38–126)
Anion gap: 6 (ref 5–15)
BUN: 14 mg/dL (ref 8–23)
CO2: 31 mmol/L (ref 22–32)
Calcium: 9.9 mg/dL (ref 8.9–10.3)
Chloride: 103 mmol/L (ref 98–111)
Creatinine: 1.17 mg/dL (ref 0.61–1.24)
GFR, Estimated: >60 mL/min (ref >60)
Glucose, Bld: 111 mg/dL — ABNORMAL HIGH (ref 70–99)
Potassium: 4.2 mmol/L (ref 3.5–5.1)
Sodium: 140 mmol/L (ref 135–145)
Total Bilirubin: 0.7 mg/dL (ref 0.3–1.2)
Total Protein: 7.4 g/dL (ref 6.5–8.1)

## 2021-11-21 LAB — CBC WITH DIFFERENTIAL (CANCER CENTER ONLY)
Abs Immature Granulocytes: 0.03 10*3/uL (ref 0.00–0.07)
Basophils Absolute: 0 10*3/uL (ref 0.0–0.1)
Basophils Relative: 0 %
Eosinophils Absolute: 0.2 10*3/uL (ref 0.0–0.5)
Eosinophils Relative: 3 %
HCT: 36.9 % — ABNORMAL LOW (ref 39.0–52.0)
Hemoglobin: 11.9 g/dL — ABNORMAL LOW (ref 13.0–17.0)
Immature Granulocytes: 1 %
Lymphocytes Relative: 16 %
Lymphs Abs: 0.9 10*3/uL (ref 0.7–4.0)
MCH: 29.7 pg (ref 26.0–34.0)
MCHC: 32.2 g/dL (ref 30.0–36.0)
MCV: 92 fL (ref 80.0–100.0)
Monocytes Absolute: 0.8 10*3/uL (ref 0.1–1.0)
Monocytes Relative: 13 %
Neutro Abs: 3.8 10*3/uL (ref 1.7–7.7)
Neutrophils Relative %: 67 %
Platelet Count: 121 10*3/uL — ABNORMAL LOW (ref 150–400)
RBC: 4.01 MIL/uL — ABNORMAL LOW (ref 4.22–5.81)
RDW: 14 % (ref 11.5–15.5)
WBC Count: 5.8 10*3/uL (ref 4.0–10.5)
nRBC: 0 % (ref 0.0–0.2)

## 2021-11-21 LAB — LACTATE DEHYDROGENASE: LDH: 227 U/L — ABNORMAL HIGH (ref 98–192)

## 2021-11-21 MED ORDER — ROMIPLOSTIM 125 MCG ~~LOC~~ SOLR
95.0000 ug | Freq: Once | SUBCUTANEOUS | Status: AC
  Administered 2021-11-21: 95 ug via SUBCUTANEOUS
  Filled 2021-11-21: qty 0.19

## 2021-11-21 NOTE — Progress Notes (Signed)
Hematology and Oncology Follow Up Visit  Lonnie Hansen [id number] Jul 12, 1950 71 y.o. 11/21/2021   Principle Diagnosis:  Chronic ITP   Current Therapy:        Nplate to keep platelet count over 100,000   Interim History:  [name] is here today for follow-up and Nplate injection. Platelets today are stable at 121, Hgb 11.9, MCV 92, WBC count 5.8.  He has not noted any blood loss. No bruising or petechiae.  He will be having surgery next week on Friday 11/28/2021 for  abdominal aneurysm repair.   No fever, chills, n/v, cough, rash, dizziness, SOB, chest pain, palpitations, abdominal pain or changes in bowel or bladder habits.  No swelling or tenderness in his extremities.  He has intermittent numbness and tingling in the right hands. Unchanged from baseline. No falls or syncope.  Appetite and hydration are good. Weight is stable at 197 lbs.   ECOG Performance Status: 1 - Symptomatic but completely ambulatory  Medications:  Allergies as of 11/21/2021   No Known Allergies      Medication List        Accurate as of November 21, 2021 11:39 AM. If you have any questions, ask your nurse or doctor.          acetaminophen 500 MG tablet Commonly known as: TYLENOL Take 1 tablet (500 mg total) by mouth every 8 (eight) hours as needed for fever (pain).   amLODipine 10 MG tablet Commonly known as: NORVASC Take 10 mg by mouth daily.   atenolol 25 MG tablet Commonly known as: TENORMIN Take 12.5 mg by mouth 2 (two) times daily.   fluticasone 50 MCG/ACT nasal spray Commonly known as: FLONASE Place 2 sprays into both nostrils daily as needed for allergies or rhinitis.   latanoprost 0.005 % ophthalmic solution Commonly known as: XALATAN Place 1 drop into both eyes 2 (two) times daily.   multivitamin with minerals Tabs tablet Take 1 tablet by mouth daily.   omega-3 acid ethyl esters 1 g capsule Commonly known as: LOVAZA Take 2 g by mouth 2 (two) times daily.    pantoprazole 40 MG tablet Commonly known as: PROTONIX Take 40 mg by mouth 2 (two) times daily.   polyethylene glycol 17 g packet Commonly known as: MIRALAX / GLYCOLAX Take 17 g by mouth daily as needed for mild constipation. Every other day What changed:  when to take this additional instructions   psyllium 95 % Pack Commonly known as: HYDROCIL/METAMUCIL Take 1 packet by mouth daily as needed for mild constipation.   rosuvastatin 20 MG tablet Commonly known as: CRESTOR Take 20 mg by mouth daily.   triamcinolone 0.025 % cream Commonly known as: KENALOG Apply 1 Application topically daily as needed (skin irritation/rash.).        Allergies: No Known Allergies  Past Medical History, Surgical history, Social history, and Family History were reviewed and updated.  Review of Systems: All other 10 point review of systems is negative.   Physical Exam:  height is '5\' 9"'$  (1.753 m) and weight is 195 lb (88.5 kg). His blood pressure is 146/83 (abnormal) and his pulse is 57 (abnormal). His respiration is 17 and oxygen saturation is 100%.   Wt Readings from Last 3 Encounters:  11/21/21 195 lb (88.5 kg)  11/18/21 195 lb 11.2 oz (88.8 kg)  10/31/21 199 lb (90.3 kg)    Ocular: Sclerae unicteric, pupils equal, round and reactive to light Ear-nose-throat: Oropharynx clear, dentition fair Lymphatic: No cervical or  supraclavicular adenopathy Lungs no rales or rhonchi, good excursion bilaterally Heart regular rate and rhythm, no murmur appreciated Abd soft, nontender, positive bowel sounds MSK no focal spinal tenderness, no joint edema Neuro: non-focal, well-oriented, appropriate affect Breasts: Deferred   Lab Results  Component Value Date   WBC 5.8 11/21/2021   HGB 11.9 (L) 11/21/2021   HCT 36.9 (L) 11/21/2021   MCV 92.0 11/21/2021   PLT 121 (L) 11/21/2021   Lab Results  Component Value Date   FERRITIN 28 10/31/2021   IRON 91 10/31/2021   TIBC 419 10/31/2021   UIBC 328  10/31/2021   IRONPCTSAT 22 10/31/2021   Lab Results  Component Value Date   RETICCTPCT 2.1 10/31/2021   RBC 4.01 (L) 11/21/2021   No results found for: "KPAFRELGTCHN", "LAMBDASER", "KAPLAMBRATIO" No results found for: "IGGSERUM", "IGA", "IGMSERUM" No results found for: "TOTALPROTELP", "ALBUMINELP", "A1GS", "A2GS", "BETS", "BETA2SER", "GAMS", "MSPIKE", "SPEI"   Chemistry      Component Value Date/Time   NA 139 11/18/2021 1330   NA 140 05/11/2019 0946   K 3.9 11/18/2021 1330   CL 107 11/18/2021 1330   CO2 24 11/18/2021 1330   BUN 9 11/18/2021 1330   BUN 17 05/11/2019 0946   CREATININE 1.04 11/18/2021 1330   CREATININE 1.22 11/14/2021 1425   CREATININE 0.75 04/10/2016 1132      Component Value Date/Time   CALCIUM 9.3 11/18/2021 1330   ALKPHOS 45 11/18/2021 1330   AST 27 11/18/2021 1330   AST 21 11/14/2021 1425   ALT 11 11/18/2021 1330   ALT 10 11/14/2021 1425   BILITOT 0.7 11/18/2021 1330   BILITOT 0.9 11/14/2021 1425       Impression and Plan: [name] is a very pleasant 71 yo African American gentleman with chronic ITP.   He will continue coming in weekly for lab and Nplate. Next week he will come in Thursday since surgery is Friday.  Follow-up in 6 weeks.   Lottie Dawson, NP 10/27/202311:39 AM

## 2021-11-21 NOTE — Telephone Encounter (Signed)
Per 11/21/21 los - gave upcoming appointments - confirmed

## 2021-11-27 ENCOUNTER — Inpatient Hospital Stay: Payer: Medicare Other | Attending: Hematology & Oncology

## 2021-11-27 ENCOUNTER — Inpatient Hospital Stay: Payer: Medicare Other

## 2021-11-27 DIAGNOSIS — I7143 Infrarenal abdominal aortic aneurysm, without rupture: Secondary | ICD-10-CM | POA: Diagnosis present

## 2021-11-27 DIAGNOSIS — G4733 Obstructive sleep apnea (adult) (pediatric): Secondary | ICD-10-CM | POA: Diagnosis present

## 2021-11-27 DIAGNOSIS — Z953 Presence of xenogenic heart valve: Secondary | ICD-10-CM | POA: Diagnosis not present

## 2021-11-27 DIAGNOSIS — D693 Immune thrombocytopenic purpura: Secondary | ICD-10-CM | POA: Insufficient documentation

## 2021-11-27 DIAGNOSIS — I728 Aneurysm of other specified arteries: Secondary | ICD-10-CM | POA: Diagnosis present

## 2021-11-27 DIAGNOSIS — Z8249 Family history of ischemic heart disease and other diseases of the circulatory system: Secondary | ICD-10-CM | POA: Diagnosis not present

## 2021-11-27 DIAGNOSIS — I2510 Atherosclerotic heart disease of native coronary artery without angina pectoris: Secondary | ICD-10-CM | POA: Diagnosis not present

## 2021-11-27 DIAGNOSIS — D62 Acute posthemorrhagic anemia: Secondary | ICD-10-CM | POA: Diagnosis not present

## 2021-11-27 DIAGNOSIS — Z87891 Personal history of nicotine dependence: Secondary | ICD-10-CM | POA: Diagnosis not present

## 2021-11-27 DIAGNOSIS — I10 Essential (primary) hypertension: Secondary | ICD-10-CM | POA: Diagnosis not present

## 2021-11-27 DIAGNOSIS — Z87442 Personal history of urinary calculi: Secondary | ICD-10-CM | POA: Diagnosis not present

## 2021-11-27 DIAGNOSIS — K219 Gastro-esophageal reflux disease without esophagitis: Secondary | ICD-10-CM | POA: Diagnosis present

## 2021-11-27 DIAGNOSIS — M1990 Unspecified osteoarthritis, unspecified site: Secondary | ICD-10-CM | POA: Diagnosis present

## 2021-11-27 DIAGNOSIS — K5790 Diverticulosis of intestine, part unspecified, without perforation or abscess without bleeding: Secondary | ICD-10-CM | POA: Diagnosis present

## 2021-11-27 DIAGNOSIS — D509 Iron deficiency anemia, unspecified: Secondary | ICD-10-CM

## 2021-11-27 DIAGNOSIS — H409 Unspecified glaucoma: Secondary | ICD-10-CM | POA: Diagnosis present

## 2021-11-27 DIAGNOSIS — I119 Hypertensive heart disease without heart failure: Secondary | ICD-10-CM | POA: Diagnosis not present

## 2021-11-27 DIAGNOSIS — D649 Anemia, unspecified: Secondary | ICD-10-CM | POA: Diagnosis not present

## 2021-11-27 DIAGNOSIS — I714 Abdominal aortic aneurysm, without rupture, unspecified: Secondary | ICD-10-CM | POA: Diagnosis not present

## 2021-11-27 DIAGNOSIS — Z79899 Other long term (current) drug therapy: Secondary | ICD-10-CM | POA: Diagnosis not present

## 2021-11-27 LAB — CBC WITH DIFFERENTIAL (CANCER CENTER ONLY)
Abs Immature Granulocytes: 0.03 10*3/uL (ref 0.00–0.07)
Basophils Absolute: 0 10*3/uL (ref 0.0–0.1)
Basophils Relative: 0 %
Eosinophils Absolute: 0.2 10*3/uL (ref 0.0–0.5)
Eosinophils Relative: 3 %
HCT: 37 % — ABNORMAL LOW (ref 39.0–52.0)
Hemoglobin: 12.1 g/dL — ABNORMAL LOW (ref 13.0–17.0)
Immature Granulocytes: 0 %
Lymphocytes Relative: 16 %
Lymphs Abs: 1.1 10*3/uL (ref 0.7–4.0)
MCH: 30 pg (ref 26.0–34.0)
MCHC: 32.7 g/dL (ref 30.0–36.0)
MCV: 91.8 fL (ref 80.0–100.0)
Monocytes Absolute: 0.9 10*3/uL (ref 0.1–1.0)
Monocytes Relative: 12 %
Neutro Abs: 5 10*3/uL (ref 1.7–7.7)
Neutrophils Relative %: 69 %
Platelet Count: 154 10*3/uL (ref 150–400)
RBC: 4.03 MIL/uL — ABNORMAL LOW (ref 4.22–5.81)
RDW: 13.8 % (ref 11.5–15.5)
WBC Count: 7.3 10*3/uL (ref 4.0–10.5)
nRBC: 0 % (ref 0.0–0.2)

## 2021-11-27 LAB — CMP (CANCER CENTER ONLY)
ALT: 8 U/L (ref 0–44)
AST: 19 U/L (ref 15–41)
Albumin: 4.8 g/dL (ref 3.5–5.0)
Alkaline Phosphatase: 44 U/L (ref 38–126)
Anion gap: 8 (ref 5–15)
BUN: 17 mg/dL (ref 8–23)
CO2: 30 mmol/L (ref 22–32)
Calcium: 10 mg/dL (ref 8.9–10.3)
Chloride: 102 mmol/L (ref 98–111)
Creatinine: 1.25 mg/dL — ABNORMAL HIGH (ref 0.61–1.24)
GFR, Estimated: >60 mL/min (ref >60)
Glucose, Bld: 106 mg/dL — ABNORMAL HIGH (ref 70–99)
Potassium: 3.8 mmol/L (ref 3.5–5.1)
Sodium: 140 mmol/L (ref 135–145)
Total Bilirubin: 0.8 mg/dL (ref 0.3–1.2)
Total Protein: 7.9 g/dL (ref 6.5–8.1)

## 2021-11-28 ENCOUNTER — Other Ambulatory Visit: Payer: Self-pay

## 2021-11-28 ENCOUNTER — Inpatient Hospital Stay (HOSPITAL_COMMUNITY): Payer: Medicare Other

## 2021-11-28 ENCOUNTER — Encounter (HOSPITAL_COMMUNITY): Payer: Self-pay | Admitting: Vascular Surgery

## 2021-11-28 ENCOUNTER — Inpatient Hospital Stay (HOSPITAL_COMMUNITY)
Admission: RE | Admit: 2021-11-28 | Discharge: 2021-11-29 | DRG: 269 | Disposition: A | Discharge status: Home Health | Payer: Medicare Other | Attending: Vascular Surgery | Admitting: Vascular Surgery

## 2021-11-28 ENCOUNTER — Inpatient Hospital Stay (HOSPITAL_COMMUNITY): Payer: Medicare Other | Admitting: Vascular Surgery

## 2021-11-28 ENCOUNTER — Encounter (HOSPITAL_COMMUNITY)
Admission: RE | Disposition: A | Discharge status: Home Health | Payer: Self-pay | Source: Home / Self Care | Attending: Vascular Surgery

## 2021-11-28 DIAGNOSIS — I714 Abdominal aortic aneurysm, without rupture, unspecified: Principal | ICD-10-CM | POA: Diagnosis present

## 2021-11-28 DIAGNOSIS — I2510 Atherosclerotic heart disease of native coronary artery without angina pectoris: Secondary | ICD-10-CM

## 2021-11-28 DIAGNOSIS — Z79899 Other long term (current) drug therapy: Secondary | ICD-10-CM

## 2021-11-28 DIAGNOSIS — M1990 Unspecified osteoarthritis, unspecified site: Secondary | ICD-10-CM | POA: Diagnosis present

## 2021-11-28 DIAGNOSIS — G4733 Obstructive sleep apnea (adult) (pediatric): Secondary | ICD-10-CM | POA: Diagnosis present

## 2021-11-28 DIAGNOSIS — I119 Hypertensive heart disease without heart failure: Secondary | ICD-10-CM

## 2021-11-28 DIAGNOSIS — I7143 Infrarenal abdominal aortic aneurysm, without rupture: Principal | ICD-10-CM | POA: Diagnosis present

## 2021-11-28 DIAGNOSIS — K219 Gastro-esophageal reflux disease without esophagitis: Secondary | ICD-10-CM | POA: Diagnosis present

## 2021-11-28 DIAGNOSIS — Z87891 Personal history of nicotine dependence: Secondary | ICD-10-CM

## 2021-11-28 DIAGNOSIS — D62 Acute posthemorrhagic anemia: Secondary | ICD-10-CM | POA: Diagnosis not present

## 2021-11-28 DIAGNOSIS — Z953 Presence of xenogenic heart valve: Secondary | ICD-10-CM | POA: Diagnosis not present

## 2021-11-28 DIAGNOSIS — D649 Anemia, unspecified: Secondary | ICD-10-CM | POA: Diagnosis not present

## 2021-11-28 DIAGNOSIS — Z8249 Family history of ischemic heart disease and other diseases of the circulatory system: Secondary | ICD-10-CM

## 2021-11-28 DIAGNOSIS — I10 Essential (primary) hypertension: Secondary | ICD-10-CM | POA: Diagnosis present

## 2021-11-28 DIAGNOSIS — H409 Unspecified glaucoma: Secondary | ICD-10-CM | POA: Diagnosis present

## 2021-11-28 DIAGNOSIS — I728 Aneurysm of other specified arteries: Secondary | ICD-10-CM | POA: Diagnosis present

## 2021-11-28 DIAGNOSIS — Z87442 Personal history of urinary calculi: Secondary | ICD-10-CM | POA: Diagnosis not present

## 2021-11-28 DIAGNOSIS — K5790 Diverticulosis of intestine, part unspecified, without perforation or abscess without bleeding: Secondary | ICD-10-CM | POA: Diagnosis present

## 2021-11-28 HISTORY — PX: ANEURYSM COILING: SHX5349

## 2021-11-28 HISTORY — PX: ABDOMINAL AORTIC ENDOVASCULAR STENT GRAFT: SHX5707

## 2021-11-28 LAB — BASIC METABOLIC PANEL
Anion gap: 9 (ref 5–15)
BUN: 11 mg/dL (ref 8–23)
CO2: 26 mmol/L (ref 22–32)
Calcium: 9.1 mg/dL (ref 8.9–10.3)
Chloride: 105 mmol/L (ref 98–111)
Creatinine, Ser: 1.06 mg/dL (ref 0.61–1.24)
GFR, Estimated: >60 mL/min (ref >60)
Glucose, Bld: 122 mg/dL — ABNORMAL HIGH (ref 70–99)
Potassium: 3.3 mmol/L — ABNORMAL LOW (ref 3.5–5.1)
Sodium: 140 mmol/L (ref 135–145)

## 2021-11-28 LAB — CBC
HCT: 31.8 % — ABNORMAL LOW (ref 39.0–52.0)
Hemoglobin: 10.7 g/dL — ABNORMAL LOW (ref 13.0–17.0)
MCH: 30.7 pg (ref 26.0–34.0)
MCHC: 33.6 g/dL (ref 30.0–36.0)
MCV: 91.1 fL (ref 80.0–100.0)
Platelets: 121 10*3/uL — ABNORMAL LOW (ref 150–400)
RBC: 3.49 MIL/uL — ABNORMAL LOW (ref 4.22–5.81)
RDW: 13.9 % (ref 11.5–15.5)
WBC: 10.1 10*3/uL (ref 4.0–10.5)
nRBC: 0 % (ref 0.0–0.2)

## 2021-11-28 LAB — MAGNESIUM: Magnesium: 1.8 mg/dL (ref 1.7–2.4)

## 2021-11-28 LAB — PROTIME-INR
INR: 1.2 (ref 0.8–1.2)
Prothrombin Time: 15.3 seconds — ABNORMAL HIGH (ref 11.4–15.2)

## 2021-11-28 LAB — APTT: aPTT: 33 seconds (ref 24–36)

## 2021-11-28 LAB — POCT ACTIVATED CLOTTING TIME: Activated Clotting Time: 269 seconds

## 2021-11-28 SURGERY — INSERTION, ENDOVASCULAR STENT GRAFT, AORTA, ABDOMINAL
Anesthesia: General | Laterality: Right

## 2021-11-28 MED ORDER — EPHEDRINE SULFATE-NACL 50-0.9 MG/10ML-% IV SOSY
PREFILLED_SYRINGE | INTRAVENOUS | Status: DC | PRN
  Administered 2021-11-28 (×3): 5 mg via INTRAVENOUS

## 2021-11-28 MED ORDER — SUCCINYLCHOLINE CHLORIDE 200 MG/10ML IV SOSY
PREFILLED_SYRINGE | INTRAVENOUS | Status: AC
  Filled 2021-11-28: qty 10

## 2021-11-28 MED ORDER — OMEGA-3-ACID ETHYL ESTERS 1 G PO CAPS
2.0000 g | ORAL_CAPSULE | Freq: Two times a day (BID) | ORAL | Status: DC
  Filled 2021-11-28 (×3): qty 2

## 2021-11-28 MED ORDER — HEPARIN 6000 UNIT IRRIGATION SOLUTION
Status: DC | PRN
  Administered 2021-11-28: 1

## 2021-11-28 MED ORDER — MAGNESIUM SULFATE 2 GM/50ML IV SOLN: 2.0000 g | Freq: Every day | INTRAVENOUS | Status: DC | PRN

## 2021-11-28 MED ORDER — DOCUSATE SODIUM 100 MG PO CAPS
100.0000 mg | ORAL_CAPSULE | Freq: Every day | ORAL | Status: DC
  Administered 2021-11-29: 100 mg via ORAL
  Filled 2021-11-28: qty 1

## 2021-11-28 MED ORDER — LATANOPROST 0.005 % OP SOLN
1.0000 [drp] | Freq: Every day | OPHTHALMIC | Status: DC
  Administered 2021-11-28: 1 [drp] via OPHTHALMIC
  Filled 2021-11-28: qty 2.5

## 2021-11-28 MED ORDER — HEPARIN 6000 UNIT IRRIGATION SOLUTION
Status: AC
  Filled 2021-11-28: qty 500

## 2021-11-28 MED ORDER — CHLORHEXIDINE GLUCONATE CLOTH 2 % EX PADS: 6.0000 | MEDICATED_PAD | Freq: Once | CUTANEOUS | Status: DC

## 2021-11-28 MED ORDER — IODIXANOL 320 MG/ML IV SOLN
INTRAVENOUS | Status: DC | PRN
  Administered 2021-11-28: 180 mL via INTRA_ARTERIAL

## 2021-11-28 MED ORDER — PROPOFOL 10 MG/ML IV BOLUS
INTRAVENOUS | Status: AC
  Filled 2021-11-28: qty 20

## 2021-11-28 MED ORDER — ONDANSETRON HCL 4 MG/2ML IJ SOLN
INTRAMUSCULAR | Status: DC | PRN
  Administered 2021-11-28: 4 mg via INTRAVENOUS

## 2021-11-28 MED ORDER — ASPIRIN 81 MG PO CHEW
81.0000 mg | CHEWABLE_TABLET | Freq: Every day | ORAL | Status: DC
  Administered 2021-11-29: 81 mg via ORAL
  Filled 2021-11-28 (×2): qty 1

## 2021-11-28 MED ORDER — HYDRALAZINE HCL 20 MG/ML IJ SOLN: 5.0000 mg | INTRAMUSCULAR | Status: DC | PRN

## 2021-11-28 MED ORDER — OXYCODONE HCL 5 MG PO TABS: 5.0000 mg | ORAL_TABLET | Freq: Once | ORAL | Status: DC | PRN

## 2021-11-28 MED ORDER — SODIUM CHLORIDE 0.9 % IV SOLN: 500.0000 mL | Freq: Once | INTRAVENOUS | Status: DC | PRN

## 2021-11-28 MED ORDER — ROSUVASTATIN CALCIUM 20 MG PO TABS: 20.0000 mg | ORAL_TABLET | Freq: Every day | ORAL | Status: DC

## 2021-11-28 MED ORDER — METOPROLOL TARTRATE 5 MG/5ML IV SOLN: 2.0000 mg | INTRAVENOUS | Status: DC | PRN

## 2021-11-28 MED ORDER — ONDANSETRON HCL 4 MG/2ML IJ SOLN: 4.0000 mg | Freq: Once | INTRAMUSCULAR | Status: DC | PRN

## 2021-11-28 MED ORDER — PANTOPRAZOLE SODIUM 40 MG PO TBEC
40.0000 mg | DELAYED_RELEASE_TABLET | Freq: Two times a day (BID) | ORAL | Status: DC
  Administered 2021-11-28 – 2021-11-29 (×2): 40 mg via ORAL
  Filled 2021-11-28 (×3): qty 1

## 2021-11-28 MED ORDER — EPHEDRINE 5 MG/ML INJ
INTRAVENOUS | Status: AC
  Filled 2021-11-28: qty 5

## 2021-11-28 MED ORDER — ACETAMINOPHEN 500 MG PO TABS
500.0000 mg | ORAL_TABLET | Freq: Three times a day (TID) | ORAL | Status: DC | PRN
  Administered 2021-11-28 – 2021-11-29 (×2): 500 mg via ORAL
  Filled 2021-11-28 (×2): qty 1

## 2021-11-28 MED ORDER — AMLODIPINE BESYLATE 10 MG PO TABS
10.0000 mg | ORAL_TABLET | Freq: Every day | ORAL | Status: DC
  Administered 2021-11-29: 10 mg via ORAL
  Filled 2021-11-28: qty 1

## 2021-11-28 MED ORDER — ONDANSETRON HCL 4 MG/2ML IJ SOLN
INTRAMUSCULAR | Status: AC
  Filled 2021-11-28: qty 2

## 2021-11-28 MED ORDER — PHENOL 1.4 % MT LIQD: 1.0000 | OROMUCOSAL | Status: DC | PRN

## 2021-11-28 MED ORDER — CEFAZOLIN SODIUM-DEXTROSE 2-4 GM/100ML-% IV SOLN
2.0000 g | INTRAVENOUS | Status: AC
  Administered 2021-11-28: 2 g via INTRAVENOUS
  Filled 2021-11-28: qty 100

## 2021-11-28 MED ORDER — BISACODYL 5 MG PO TBEC: 5.0000 mg | DELAYED_RELEASE_TABLET | Freq: Every day | ORAL | Status: DC | PRN

## 2021-11-28 MED ORDER — POTASSIUM CHLORIDE CRYS ER 20 MEQ PO TBCR: 20.0000 meq | EXTENDED_RELEASE_TABLET | Freq: Every day | ORAL | Status: DC | PRN

## 2021-11-28 MED ORDER — PSYLLIUM 95 % PO PACK: 1.0000 | PACK | Freq: Every day | ORAL | Status: DC | PRN

## 2021-11-28 MED ORDER — FENTANYL CITRATE (PF) 250 MCG/5ML IJ SOLN
INTRAMUSCULAR | Status: DC | PRN
  Administered 2021-11-28: 100 ug via INTRAVENOUS
  Administered 2021-11-28 (×2): 25 ug via INTRAVENOUS

## 2021-11-28 MED ORDER — PHENYLEPHRINE HCL-NACL 20-0.9 MG/250ML-% IV SOLN
INTRAVENOUS | Status: DC | PRN
  Administered 2021-11-28: 20 ug/min via INTRAVENOUS

## 2021-11-28 MED ORDER — PROPOFOL 10 MG/ML IV BOLUS
INTRAVENOUS | Status: DC | PRN
  Administered 2021-11-28: 50 mg via INTRAVENOUS
  Administered 2021-11-28: 150 mg via INTRAVENOUS

## 2021-11-28 MED ORDER — ROCURONIUM BROMIDE 10 MG/ML (PF) SYRINGE
PREFILLED_SYRINGE | INTRAVENOUS | Status: AC
  Filled 2021-11-28: qty 10

## 2021-11-28 MED ORDER — DEXAMETHASONE SODIUM PHOSPHATE 10 MG/ML IJ SOLN
INTRAMUSCULAR | Status: DC | PRN
  Administered 2021-11-28: 10 mg via INTRAVENOUS

## 2021-11-28 MED ORDER — PROTAMINE SULFATE 10 MG/ML IV SOLN
INTRAVENOUS | Status: AC
  Filled 2021-11-28: qty 5

## 2021-11-28 MED ORDER — DEXAMETHASONE SODIUM PHOSPHATE 10 MG/ML IJ SOLN
INTRAMUSCULAR | Status: AC
  Filled 2021-11-28: qty 1

## 2021-11-28 MED ORDER — GUAIFENESIN-DM 100-10 MG/5ML PO SYRP: 15.0000 mL | ORAL_SOLUTION | ORAL | Status: DC | PRN

## 2021-11-28 MED ORDER — LIDOCAINE 2% (20 MG/ML) 5 ML SYRINGE
INTRAMUSCULAR | Status: AC
  Filled 2021-11-28: qty 5

## 2021-11-28 MED ORDER — ROCURONIUM BROMIDE 10 MG/ML (PF) SYRINGE
PREFILLED_SYRINGE | INTRAVENOUS | Status: DC | PRN
  Administered 2021-11-28: 20 mg via INTRAVENOUS
  Administered 2021-11-28: 10 mg via INTRAVENOUS
  Administered 2021-11-28: 50 mg via INTRAVENOUS

## 2021-11-28 MED ORDER — HEPARIN SODIUM (PORCINE) 1000 UNIT/ML IJ SOLN
INTRAMUSCULAR | Status: AC
  Filled 2021-11-28: qty 10

## 2021-11-28 MED ORDER — ONDANSETRON HCL 4 MG/2ML IJ SOLN: 4.0000 mg | Freq: Four times a day (QID) | INTRAMUSCULAR | Status: DC | PRN

## 2021-11-28 MED ORDER — LACTATED RINGERS IV SOLN: INTRAVENOUS | Status: DC

## 2021-11-28 MED ORDER — POLYETHYLENE GLYCOL 3350 17 G PO PACK: 17.0000 g | PACK | Freq: Every day | ORAL | Status: DC | PRN

## 2021-11-28 MED ORDER — PROTAMINE SULFATE 10 MG/ML IV SOLN
INTRAVENOUS | Status: DC | PRN
  Administered 2021-11-28: 50 mg via INTRAVENOUS

## 2021-11-28 MED ORDER — HYDROMORPHONE HCL 1 MG/ML IJ SOLN: 0.5000 mg | INTRAMUSCULAR | Status: DC | PRN

## 2021-11-28 MED ORDER — LIDOCAINE 2% (20 MG/ML) 5 ML SYRINGE
INTRAMUSCULAR | Status: DC | PRN
  Administered 2021-11-28: 100 mg via INTRAVENOUS

## 2021-11-28 MED ORDER — OXYCODONE HCL 5 MG/5ML PO SOLN: 5.0000 mg | Freq: Once | ORAL | Status: DC | PRN

## 2021-11-28 MED ORDER — ATENOLOL 25 MG PO TABS
12.5000 mg | ORAL_TABLET | Freq: Two times a day (BID) | ORAL | Status: DC
  Administered 2021-11-28 – 2021-11-29 (×2): 12.5 mg via ORAL
  Filled 2021-11-28 (×3): qty 1

## 2021-11-28 MED ORDER — HEPARIN SODIUM (PORCINE) 1000 UNIT/ML IJ SOLN
INTRAMUSCULAR | Status: DC | PRN
  Administered 2021-11-28: 8000 [IU] via INTRAVENOUS

## 2021-11-28 MED ORDER — CEFAZOLIN SODIUM-DEXTROSE 2-4 GM/100ML-% IV SOLN
2.0000 g | Freq: Three times a day (TID) | INTRAVENOUS | Status: AC
  Administered 2021-11-28 (×2): 2 g via INTRAVENOUS
  Filled 2021-11-28 (×2): qty 100

## 2021-11-28 MED ORDER — CHLORHEXIDINE GLUCONATE 0.12 % MT SOLN
15.0000 mL | Freq: Once | OROMUCOSAL | Status: AC
  Administered 2021-11-28: 15 mL via OROMUCOSAL
  Filled 2021-11-28: qty 15

## 2021-11-28 MED ORDER — ORAL CARE MOUTH RINSE: 15.0000 mL | Freq: Once | OROMUCOSAL | Status: AC

## 2021-11-28 MED ORDER — OXYCODONE HCL 5 MG PO TABS: 5.0000 mg | ORAL_TABLET | ORAL | Status: DC | PRN

## 2021-11-28 MED ORDER — ROSUVASTATIN CALCIUM 20 MG PO TABS
20.0000 mg | ORAL_TABLET | Freq: Every day | ORAL | Status: DC
  Administered 2021-11-28: 20 mg via ORAL
  Filled 2021-11-28 (×2): qty 1

## 2021-11-28 MED ORDER — HEPARIN SODIUM (PORCINE) 5000 UNIT/ML IJ SOLN
5000.0000 [IU] | Freq: Three times a day (TID) | INTRAMUSCULAR | Status: DC
  Administered 2021-11-29: 5000 [IU] via SUBCUTANEOUS
  Filled 2021-11-28: qty 1

## 2021-11-28 MED ORDER — SODIUM CHLORIDE 0.9 % IV SOLN: INTRAVENOUS | Status: DC

## 2021-11-28 MED ORDER — LABETALOL HCL 5 MG/ML IV SOLN: 10.0000 mg | INTRAVENOUS | Status: DC | PRN

## 2021-11-28 MED ORDER — FLUTICASONE PROPIONATE 50 MCG/ACT NA SUSP: 2.0000 | Freq: Every day | NASAL | Status: DC | PRN

## 2021-11-28 MED ORDER — SUCCINYLCHOLINE CHLORIDE 200 MG/10ML IV SOSY
PREFILLED_SYRINGE | INTRAVENOUS | Status: DC | PRN
  Administered 2021-11-28: 140 mg via INTRAVENOUS

## 2021-11-28 MED ORDER — FENTANYL CITRATE (PF) 100 MCG/2ML IJ SOLN: 25.0000 ug | INTRAMUSCULAR | Status: DC | PRN

## 2021-11-28 MED ORDER — ALUM & MAG HYDROXIDE-SIMETH 200-200-20 MG/5ML PO SUSP: 15.0000 mL | ORAL | Status: DC | PRN

## 2021-11-28 MED ORDER — SUGAMMADEX SODIUM 200 MG/2ML IV SOLN
INTRAVENOUS | Status: DC | PRN
  Administered 2021-11-28: 200 mg via INTRAVENOUS

## 2021-11-28 MED ORDER — FENTANYL CITRATE (PF) 250 MCG/5ML IJ SOLN
INTRAMUSCULAR | Status: AC
  Filled 2021-11-28: qty 5

## 2021-11-28 SURGICAL SUPPLY — 69 items
ADH SKN CLS APL DERMABOND .7 (GAUZE/BANDAGES/DRESSINGS) ×2
ADH SKN CLS LQ APL DERMABOND (GAUZE/BANDAGES/DRESSINGS) ×2
BAG COUNTER SPONGE SURGICOUNT (BAG) ×2 IMPLANT
BAG SPNG CNTER NS LX DISP (BAG) ×2
BLADE CLIPPER SURG (BLADE) ×2 IMPLANT
CANISTER SUCT 3000ML PPV (MISCELLANEOUS) ×2 IMPLANT
CATH BEACON 5.038 65CM KMP-01 (CATHETERS) ×2 IMPLANT
CATH CROSS OVER TEMPO 5F (CATHETERS) IMPLANT
CATH OMNI FLUSH .035X70CM (CATHETERS) ×2 IMPLANT
CLIP LIGATING EXTRA MED SLVR (CLIP) IMPLANT
CLIP LIGATING EXTRA SM BLUE (MISCELLANEOUS) IMPLANT
COIL EMBOL RETRACTA 18X14 .035 (Embolic) IMPLANT
COIL EMBOL RETRACTA 20X14 .035 (Embolic) IMPLANT
COIL NESTER 14X16 (Embolic) IMPLANT
DERMABOND ADVANCED .7 DNX12 (GAUZE/BANDAGES/DRESSINGS) ×2 IMPLANT
DERMABOND ADVANCED .7 DNX6 (GAUZE/BANDAGES/DRESSINGS) IMPLANT
DEVICE CLOSURE PERCLS PRGLD 6F (VASCULAR PRODUCTS) ×8 IMPLANT
DRSG TEGADERM 2-3/8X2-3/4 SM (GAUZE/BANDAGES/DRESSINGS) ×4 IMPLANT
DRYSEAL FLEXSHEATH 12FR 33CM (SHEATH) ×2
DRYSEAL FLEXSHEATH 16FR 33CM (SHEATH) ×2
ELECT REM PT RETURN 9FT ADLT (ELECTROSURGICAL) ×4
ELECTRODE REM PT RTRN 9FT ADLT (ELECTROSURGICAL) ×4 IMPLANT
EXCLDR EXT ENDO 28X4.5 16F (Endovascular Graft) ×2 IMPLANT
EXCLDR TRNK 28.5X14.5X12 16F (Endovascular Graft) ×2 IMPLANT
EXCLUDER EXT ENDO 28X4.5 16F (Endovascular Graft) IMPLANT
EXCLUDER TNK 28.5X14.5X12 16F (Endovascular Graft) IMPLANT
GAUZE 4X4 16PLY ~~LOC~~+RFID DBL (SPONGE) ×2 IMPLANT
GAUZE SPONGE 2X2 8PLY STRL LF (GAUZE/BANDAGES/DRESSINGS) ×4 IMPLANT
GLIDEWIRE ADV .035X180CM (WIRE) ×2 IMPLANT
GLOVE BIO SURGEON STRL SZ7.5 (GLOVE) ×2 IMPLANT
GOWN STRL REUS W/ TWL LRG LVL3 (GOWN DISPOSABLE) ×4 IMPLANT
GOWN STRL REUS W/ TWL XL LVL3 (GOWN DISPOSABLE) ×4 IMPLANT
GOWN STRL REUS W/TWL LRG LVL3 (GOWN DISPOSABLE) ×4
GOWN STRL REUS W/TWL XL LVL3 (GOWN DISPOSABLE) ×4
GRAFT BALLN CATH 65CM (STENTS) ×2 IMPLANT
GUIDEWIRE ANGLED .035X150CM (WIRE) IMPLANT
KIT BASIN OR (CUSTOM PROCEDURE TRAY) ×2 IMPLANT
KIT DRAIN CSF ACCUDRAIN (MISCELLANEOUS) IMPLANT
KIT TURNOVER KIT B (KITS) ×2 IMPLANT
LEG CONTRALATERAL 16X12X14 (Vascular Products) ×2 IMPLANT
LEG CONTRALATERAL 16X18X9.5 (Endovascular Graft) IMPLANT
NS IRRIG 1000ML POUR BTL (IV SOLUTION) ×2 IMPLANT
PACK ENDOVASCULAR (PACKS) ×2 IMPLANT
PAD ARMBOARD 7.5X6 YLW CONV (MISCELLANEOUS) ×4 IMPLANT
PERCLOSE PROGLIDE 6F (VASCULAR PRODUCTS) ×14
POWDER SURGICEL 3.0 GRAM (HEMOSTASIS) IMPLANT
SET MICROPUNCTURE 5F STIFF (MISCELLANEOUS) ×2 IMPLANT
SHEATH BRITE TIP 8FR 23CM (SHEATH) ×2 IMPLANT
SHEATH DRYSEAL FLEX 12FR 33CM (SHEATH) IMPLANT
SHEATH DRYSEAL FLEX 16FR 33CM (SHEATH) IMPLANT
SHEATH FLEX ANSEL ANG 6F 45CM (SHEATH) IMPLANT
SHEATH PINNACLE 6F 10CM (SHEATH) IMPLANT
SHEATH PINNACLE 8F 10CM (SHEATH) ×2 IMPLANT
SPONGE T-LAP 18X18 ~~LOC~~+RFID (SPONGE) ×4 IMPLANT
STENT GRAFT BALLN CATH 65CM (STENTS) ×2
STENT GRAFT CONTRALAT 16X12X14 (Vascular Products) IMPLANT
STOPCOCK MORSE 400PSI 3WAY (MISCELLANEOUS) ×2 IMPLANT
SUT MNCRL AB 4-0 PS2 18 (SUTURE) ×4 IMPLANT
SUT PROLENE 5 0 C 1 24 (SUTURE) IMPLANT
SUT VIC AB 2-0 CT1 27 (SUTURE)
SUT VIC AB 2-0 CT1 TAPERPNT 27 (SUTURE) IMPLANT
SUT VIC AB 3-0 SH 27 (SUTURE)
SUT VIC AB 3-0 SH 27X BRD (SUTURE) IMPLANT
SYR 20ML LL LF (SYRINGE) ×2 IMPLANT
TOWEL GREEN STERILE (TOWEL DISPOSABLE) ×2 IMPLANT
TRAY FOLEY MTR SLVR 16FR STAT (SET/KITS/TRAYS/PACK) ×2 IMPLANT
TUBING INJECTOR 48 (MISCELLANEOUS) ×2 IMPLANT
WIRE AMPLATZ SS-J .035X180CM (WIRE) ×2 IMPLANT
WIRE BENTSON .035X145CM (WIRE) ×4 IMPLANT

## 2021-11-28 NOTE — Op Note (Signed)
Patient name: Lonnie Hansen MRN: [id number] DOB: December 02, 1950 Sex: male  11/28/2021 Pre-operative Diagnosis: AAA Post-operative diagnosis:  Same Surgeon:  Erlene Quan C. Donzetta Matters, MD Assistant: Laurence Slate, PA Procedure Performed: 1.  Percutaneous access and closure of bilateral common femoral arteries 2.  Treatment of abdominal aortic aneurysm with endovascular aneurysm repair with main body left 28 x 14 x 12 extended with 18 x 9.5 cm limb and proximal extension 28 x 4.5 cm device via 16 French sheath with right limb 12 x 14 cm 3.  Coil embolization of right hypogastric artery with 20 x 14 cm x 5 and 16 x 14 cm x 2 Cook Retracta and 18 x 14 cm pushable coil   Indications: 71 year old male with abdominal aortic aneurysm that has been followed and now meets criteria for repair.  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  Findings: I could not select individual branches of the right hypogastric artery easily and thus the hypogastric artery itself was coiled and at completion this did not fill with contrast.  The neck was quite angulated initially there was a type I endoleak on the right lateral sidewall and this was extended upwards with a 28 mm cuff and at completion there were no endoleak's proximally.  On the right side we extended past the hypogastric artery on the left side we stopped short of the hypogastric artery.  At completion there were dorsalis pedis pulses palpable bilaterally.   Procedure:  The patient was identified in the holding area and taken to the operating room was placed supine operative table and general anesthesia was induced.  He was sterilely prepped and draped in the in the abdomen and bilateral groins in the usual fashion, antibiotics were minister timeout was called.  We began with ultrasound-guided cannulation of the left common femoral artery with a micropuncture needle followed by wire  and a sheath.  I placed a Bentson wire followed by a 6 [place] sheath.  I then crossed the bifurcation with a crossover catheter and was able to select the hypogastric artery and placed a long 6 [place] Ansell sheath into the hypogastric.  I then used a Kumpe catheter to attempt to select the out with branches of the hypogastric artery but could not easily select this.  I then elected to drop coils with a total of 8 coils being placed in the hypogastric artery and completion demonstrated thrombosis.  Satisfied with this I placed a stiff wire into the aorta.  I then percutaneously accessed the right common femoral artery using micropuncture needle followed by wire and a sheath.  I placed an 8 [place] dilator and then a Bentson wire and then deployed to percutaneous closure devices.  Unfortunately with the second percutaneous closure device it did hook to the first 1 and ultimately I had to abandon and removed both of those.  I did have bleeding at this time and I tightened both sutures down and it did tamponade the bleeding.  I then cannulated the common femoral artery in a very similar site using ultrasound guidance and again placed micropuncture sheath followed by Bentson wire and then deployed to percutaneous access closure devices which did deploy well.  I then placed a stiff wire and placed a 12 French sheath through there and the groin was hemostatic.  Back on the left side I then placed a 16 French sheath into the aorta to the  level of L2 and the patient was fully heparinized.  The main body device was brought up to the level of L2 and aortogram was performed via Omni catheter from the right.  We then angled the device and deployed it based on the left renal artery.  I did have to reposition higher redeployed but still the angle was not correct.  I went ahead and cannulated the gate and was able to spin and Omni catheter within the device.  We then extended on the right side down past the hypogastric artery with  a 12 x 14 cm device.  On the left side we performed retrograde angiography completed the device deployment and then extended down to the level of the hypogastric artery with an 18 x 9.5 cm device.  The entirety of the device was then ballooned open.  At completion there was a type I endoleak on the right lateral side and I elected to place the cuff higher up.  The cuff was also conformable landed just on the left renal artery but I was able to get a few centimeters of additional purchase higher on the right side.  This was then ballooned open as well.  Completion demonstrated no evidence of type I endoleak there was maybe a late type II endoleak at the crotch of the graft.  Satisfied with this we then placed Bentson wires on both sides.  I first closed the left side with 16 French sheath and this closed well.  On the right side I then had to deploy an additional closure device in the anterior direction at 12:00.  This did obtain somewhat of hemostasis and the wire was removed and all of the sutures were cinched.  We placed hemostats over the groin and held pressure.  There were good signals of both the dorsalis pedis pulses bilaterally and I elected to give 50 mg of protamine.  And few additional minutes of pressure held in the right common femoral artery ultimately this was hemostatic.  Both groin incisions were closed with Monocryl and Dermabond was placed at the skin site.  Patient was then awakened from anesthesia having tolerated procedure without any complication.  All counts were correct at completion.   Given the complexity of the case,  the assistant was necessary in order to expedient the procedure and safely perform the technical aspects of the operation.  The assistant assisted with sheath, catheter, wire exchange that could not have been performed by attack.  EBL: 100cc  Zuha Dejonge C. Donzetta Matters, MD [facility] Office: [phone] Pager: [phone]

## 2021-11-28 NOTE — Discharge Instructions (Signed)
  [facility]   Discharge Instructions  Endovascular Aortic Aneurysm Repair  Please refer to the following instructions for your post-procedure care. Your surgeon or Physician Assistant will discuss any changes with you.  Activity  You are encouraged to walk as much as you can. You can slowly return to normal activities but must avoid strenuous activity and heavy lifting until your doctor tells you it's OK. Avoid activities such as vacuuming or swinging a gold club. It is normal to feel tired for several weeks after your surgery. Do not drive until your doctor gives the OK and you are no longer taking prescription pain medications. It is also normal to have difficulty with sleep habits, eating, and bowel movements after surgery. These will go away with time.  Bathing/Showering  Shower daily after you go home.  Do not soak in a bathtub, hot tub, or swim until the incision heals completely.  If you have incisions in your groin, wash the groin wounds with soap and water daily and pat dry. (No tub bath-only shower)  Then put a dry gauze or washcloth there to keep this area dry to help prevent wound infection daily and as needed.  Do not use Vaseline or neosporin on your incisions.  Only use soap and water on your incisions and then protect and keep dry.  Incision Care  Shower every day. Clean your incision with mild soap and water. Pat the area dry with a clean towel. You do not need a bandage unless otherwise instructed. Do not apply any ointments or creams to your incision. If you clothing is irritating, you may cover your incision with a dry gauze pad.  Diet  Resume your normal diet. There are no special food restrictions following this procedure. A low fat/low cholesterol diet is recommended for all patients with vascular disease. In order to heal from your surgery, it is CRITICAL to get adequate nutrition. Your body requires vitamins, minerals, and protein.  Vegetables are the best source of vitamins and minerals. Vegetables also provide the perfect balance of protein. Processed food has little nutritional value, so try to avoid this.  Medications  Resume taking all of your medications unless your doctor or nurse practitioner tells you not to. If your incision is causing pain, you may take over-the-counter pain relievers such as acetaminophen (Tylenol). If you were prescribed a stronger pain medication, please be aware these medications can cause nausea and constipation. Prevent nausea by taking the medication with a snack or meal. Avoid constipation by drinking plenty of fluids and eating foods with a high amount of fiber, such as fruits, vegetables, and grains.  Do not take Tylenol if you are taking prescription pain medications.   Follow up  Our office will schedule a follow-up appointment with a CT scan 3-4 weeks after your surgery.  Please call us immediately for any of the following conditions  Severe or worsening pain in your legs or feet or in your abdomen back or chest. Increased pain, redness, drainage (pus) from your incision site. Increased abdominal pain, bloating, nausea, vomiting or persistent diarrhea. Fever of 101 degrees or higher. Swelling in your leg (s),  Reduce your risk of vascular disease  Stop smoking. If you would like help call [facility] at 1-800-QUIT-NOW (1-800-784-8669) or [facility] at 336-586-4000. Manage your cholesterol Maintain a desired weight Control your diabetes Keep your blood pressure down  If you have questions, please call the office at [phone].  

## 2021-11-28 NOTE — Anesthesia Postprocedure Evaluation (Signed)
Anesthesia Post Note  Patient: Lonnie Hansen  Procedure(s) Performed: ABDOMINAL AORTIC ENDOVASCULAR STENT GRAFT ANEURYSM COILING RIGHT INTERNAL ILIAC (Right)     Patient location during evaluation: PACU Anesthesia Type: General Level of consciousness: awake and alert Pain management: pain level controlled Vital Signs Assessment: post-procedure vital signs reviewed and stable Respiratory status: spontaneous breathing, nonlabored ventilation, respiratory function stable and patient connected to nasal cannula oxygen Cardiovascular status: blood pressure returned to baseline and stable Postop Assessment: no apparent nausea or vomiting Anesthetic complications: no  No notable events documented.  Last Vitals:  Vitals:   11/28/21 1045 11/28/21 1115  BP: 130/69 119/73  Pulse: (!) 56 (!) 53  Resp: 14 13  Temp:    SpO2: 99% 100%    Last Pain:  Vitals:   11/28/21 1115  TempSrc:   PainSc: 0-No pain                 Shanikia Kernodle S

## 2021-11-28 NOTE — Progress Notes (Signed)
Patient arrived from PACU to 4e02, patient with B groins, A line. Placed on monitor vital signs obtained and CCMD made aware. Bed side RN in room call bell with in reach. Dai Mcadams, Bettina Gavia RN

## 2021-11-28 NOTE — Transfer of Care (Signed)
Immediate Anesthesia Transfer of Care Note  Patient: TRANQUILINO FISCHLER  Procedure(s) Performed: ABDOMINAL AORTIC ENDOVASCULAR STENT GRAFT ANEURYSM COILING RIGHT INTERNAL ILIAC (Right)  Patient Location: PACU  Anesthesia Type:General  Level of Consciousness: drowsy and patient cooperative  Airway & Oxygen Therapy: Patient Spontanous Breathing  Post-op Assessment: Report given to RN and Post -op Vital signs reviewed and stable  Post vital signs: Reviewed and stable  Last Vitals:  Vitals Value Taken Time  BP 141/73 11/28/21 1032  Temp    Pulse 58 11/28/21 1035  Resp 15 11/28/21 1035  SpO2 91 % 11/28/21 1035  Vitals shown include unvalidated device data.  Last Pain:  Vitals:   11/28/21 0614  TempSrc:   PainSc: 0-No pain         Complications: No notable events documented.

## 2021-11-28 NOTE — Anesthesia Procedure Notes (Signed)
Procedure Name: Intubation Date/Time: 11/28/2021 7:57 AM  Performed by: Thelma Comp, CRNAPre-anesthesia Checklist: Patient identified, Emergency Drugs available, Suction available and Patient being monitored Patient Re-evaluated:Patient Re-evaluated prior to induction Oxygen Delivery Method: Circle System Utilized Preoxygenation: Pre-oxygenation with 100% oxygen Induction Type: IV induction Ventilation: Mask ventilation without difficulty and Oral airway inserted - appropriate to patient size Laryngoscope Size: Mac and 4 Grade View: Grade I Tube type: Oral Tube size: 7.5 mm Number of attempts: 1 Airway Equipment and Method: Stylet and Oral airway Placement Confirmation: ETT inserted through vocal cords under direct vision, positive ETCO2 and breath sounds checked- equal and bilateral Secured at: 23 cm Tube secured with: Tape Dental Injury: Teeth and Oropharynx as per pre-operative assessment

## 2021-11-28 NOTE — Progress Notes (Signed)
        Post op note  Patient resting comfortably in 4E 02. Small right groin hematoma. 1+ right and 2+ left DP without lower extremity symptoms.   Gurinder Toral C. Donzetta Matters, MD [facility] Office: [phone] Pager: [phone]

## 2021-11-28 NOTE — H&P (Signed)
HPI:   Lonnie Hansen is a 71 y.o. male with known abdominal aortic aneurysm now measuring nearly 5.5 cm here for follow-up from recent CT scan.  Denies any new back or abdominal pain.  No new leg pain continues to walk without limitation with the help of a cane.       Past Medical History:  Diagnosis Date   AAA (abdominal aortic aneurysm) (HCC)     Anemia     Arthritis     Asymptomatic bilateral carotid artery stenosis 08/2015    1-39%    Cataracts, bilateral     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   Coronary artery disease     Diverticulosis     Enlarged aorta (HCC)     Enlarged prostate      slightly   GERD (gastroesophageal reflux disease)      takes Pantoprazole daily as needed   Glaucoma      uses eye drops daily   Headache     History of colon polyps      benign   History of kidney stones     Hyperlipidemia      no on any meds   Hypertension      takes Amlodipine and Atenolol daily   OSA on CPAP     Vocal cord nodule      pt. states  it's a" growth on vocal cord"         Family History  Problem Relation Age of Onset   Heart disease Father     Heart attack Father     Thyroid disease Sister           Past Surgical History:  Procedure Laterality Date   AORTIC ARCH ANGIOGRAPHY N/A 04/16/2016    Procedure: Aortic Arch Angiography;  Surgeon: [name] Martinique, MD;  Location: [facility];  Service: Cardiovascular;  Laterality: N/A;   AORTIC VALVE REPLACEMENT N/A 09/15/2016    Procedure: AORTIC VALVE REPLACEMENT (AVR);  Surgeon: Grace Isaac, MD;  Location: [facility];  Service: Open Heart Surgery;  Laterality: N/A;  Using 20m Edwards Perimount Magna Ease Aortic Bioprosthesis Valve   ASCENDING AORTIC ROOT REPLACEMENT N/A 09/15/2016    Procedure: ASCENDING AORTIC ROOT REPLACEMENT;  Surgeon: GGrace Isaac MD;  [facility] [facility]  Service: Open Heart Surgery;  Laterality: N/A;  Using 354mGelweave Valsalva Graft   CHOLECYSTECTOMY N/A  12/19/2020    Procedure: LAPAROSCOPIC CHOLECYSTECTOMY WITH POSSIBLE INTRAOPERATIVE CHOLANGIOGRAM;  Surgeon: WiGreer PickerelMD;  [facility] Service: General;  Laterality: N/A;   COLONOSCOPY       COLONOSCOPY Left 04/16/2019    Procedure: COLONOSCOPY;  Surgeon: OuArta SilenceMD;  [facility];  Service: Endoscopy;  Laterality: Left;   COLONOSCOPY N/A 06/01/2020    Procedure: COLONOSCOPY;  Surgeon: KaRonnette JuniperMD;  [facility] Service: Gastroenterology;  Laterality: N/A;   COLONOSCOPY WITH ESOPHAGOGASTRODUODENOSCOPY (EGD)       COLONOSCOPY WITH PROPOFOL N/A 05/14/2021    Procedure: COLONOSCOPY WITH PROPOFOL;  Surgeon: KaRonnette JuniperMD;  [facility];  Service: Gastroenterology;  Laterality: N/A;   ERCP N/A 08/19/2021    Procedure: ENDOSCOPIC RETROGRADE CHOLANGIOPANCREATOGRAPHY (ERCP);  Surgeon: MaClarene EssexMD;  [facility];  Service: Gastroenterology;  Laterality: N/A;  at 1:30 pm   ESOPHAGOGASTRODUODENOSCOPY N/A 05/25/2020    Procedure: ESOPHAGOGASTRODUODENOSCOPY (EGD);  Surgeon: ScWilford CornerMD;  [facility] Service: Endoscopy;  Laterality: N/A;   ESOPHAGOGASTRODUODENOSCOPY  N/A 08/19/2021    Procedure: ESOPHAGOGASTRODUODENOSCOPY (EGD);  Surgeon: Otis Brace, MD;  [facility];  Service: Gastroenterology;  Laterality: N/A;   ESOPHAGOGASTRODUODENOSCOPY (EGD) WITH PROPOFOL Left 04/16/2019    Procedure: ESOPHAGOGASTRODUODENOSCOPY (EGD) WITH PROPOFOL;  Surgeon: Arta Silence, MD;  Location: [facility];  Service: Endoscopy;  Laterality: Left;   ESOPHAGOGASTRODUODENOSCOPY (EGD) WITH PROPOFOL N/A 08/23/2021    Procedure: ESOPHAGOGASTRODUODENOSCOPY (EGD) WITH PROPOFOL;  Surgeon: Ronnette Juniper, MD;  [facility];  Service: Gastroenterology;  Laterality: N/A;   FLEXIBLE SIGMOIDOSCOPY N/A 05/25/2020    Procedure: FLEXIBLE SIGMOIDOSCOPY;  Surgeon: Wilford Corner, MD;  Location: [facility];  Service: Endoscopy;   Laterality: N/A;   HEMOSTASIS CONTROL   08/19/2021    Procedure: HEMOSTASIS CONTROL;  Surgeon: Otis Brace, MD;  [facility];  Service: Gastroenterology;;   IR [place] VESSEL   05/26/2021   IR ANGIOGRAM SELECTIVE EACH ADDITIONAL VESSEL   08/20/2021   IR ANGIOGRAM SELECTIVE EACH ADDITIONAL VESSEL   08/20/2021   IR ANGIOGRAM VISCERAL SELECTIVE   05/26/2021   IR ANGIOGRAM VISCERAL SELECTIVE   08/20/2021   IR EMBO ART  VEN HEMORR LYMPH EXTRAV  INC GUIDE ROADMAPPING   05/26/2021   IR EMBO ART  VEN HEMORR LYMPH EXTRAV  INC GUIDE ROADMAPPING   08/20/2021   IR FLUORO GUIDE CV LINE RIGHT   08/20/2021   IR THORACENTESIS ASP PLEURAL SPACE W/IMG GUIDE   10/23/2016   IR US GUIDE VASC ACCESS LEFT   05/26/2021   IR US GUIDE VASC ACCESS RIGHT   05/26/2021   IR US GUIDE VASC ACCESS RIGHT   08/20/2021   IR US GUIDE VASC ACCESS RIGHT   08/20/2021   MICROLARYNGOSCOPY Right 09/20/2017    Procedure: MICROLARYNGOSCOPY WITH  EXCISION OF VOCAL CORD LESION;  Surgeon: Leta Baptist, MD;  Location: [facility];  Service: ENT;  Laterality: Right;   MULTIPLE EXTRACTIONS WITH ALVEOLOPLASTY N/A 06/10/2016    Procedure: Extraction of tooth #'s 2,8,13,15, and 29  with alveoloplasty, maxillary right and left buccal exostoses reductions, and gross debridement of remaining teeth.;  Surgeon: Lenn Cal, DDS;  Location: [facility];  Service: Oral Surgery;  Laterality: N/A;   POLYPECTOMY   05/14/2021    Procedure: POLYPECTOMY;  Surgeon: Ronnette Juniper, MD;  Location[facility];  Service: Gastroenterology;;   REMOVAL OF STONES   08/19/2021    Procedure: REMOVAL OF STONES;  Surgeon: Clarene Essex, MD;  [facility];  Service: Gastroenterology;;   RIGHT/LEFT HEART CATH AND CORONARY ANGIOGRAPHY N/A 04/16/2016    Procedure: Right/Left Heart Cath and Coronary Angiography;  Surgeon: [name] Martinique, MD;  Location: [facility];  Service: Cardiovascular;  Laterality: N/A;   SPHINCTEROTOMY    08/19/2021    Procedure: SPHINCTEROTOMY;  Surgeon: Clarene Essex, MD;  [facility];  Service: Gastroenterology;;   TEE WITHOUT CARDIOVERSION N/A 09/15/2016    Procedure: TRANSESOPHAGEAL ECHOCARDIOGRAM (TEE);  Surgeon: Grace Isaac, MD;  Location: [facility];  Service: Open Heart Surgery;  Laterality: N/A;      Short Social History:  Social History         Tobacco Use   Smoking status: Former      Packs/day: 1.00      Years: 25.00      Total pack years: 25.00      Types: Cigarettes   Smokeless tobacco: Never  Substance Use Topics   Alcohol use: No      Alcohol/week: 0.0 standard drinks of  alcohol      No Known Allergies         Current Outpatient Medications  Medication Sig Dispense Refill   atenolol (TENORMIN) 25 MG tablet Take 12.5 mg by mouth 2 (two) times daily. 180 tablet 3   ferrous sulfate 325 (65 FE) MG tablet Take 325 mg by mouth daily.       fluticasone (FLONASE) 50 MCG/ACT nasal spray Place 2 sprays into both nostrils daily as needed for allergies or rhinitis.       latanoprost (XALATAN) 0.005 % ophthalmic solution Place 1 drop into both eyes 2 (two) times daily.       Multiple Vitamin (MULTIVITAMIN WITH MINERALS) TABS tablet Take 1 tablet by mouth daily.       pantoprazole (PROTONIX) 40 MG tablet Take 40 mg by mouth 2 (two) times daily.       polyethylene glycol (MIRALAX / GLYCOLAX) 17 g packet Take 17 g by mouth daily as needed for mild constipation. Every other day       psyllium (HYDROCIL/METAMUCIL) 95 % PACK Take 1 packet by mouth daily as needed for mild constipation.       rosuvastatin (CRESTOR) 20 MG tablet Take 20 mg by mouth daily.       triamcinolone cream (KENALOG) 0.1 % Apply 1 application. topically 3 (three) times daily as needed (facial itching/rash).       acetaminophen (TYLENOL) 500 MG tablet Take 1 tablet (500 mg total) by mouth every 8 (eight) hours as needed for fever (pain). (Patient not taking: Reported on 10/03/2021)        No current  facility-administered medications for this visit.      Review of Systems  Constitutional:  Constitutional negative. HENT: HENT negative.  Eyes: Eyes negative.  Cardiovascular: Cardiovascular negative.  GI: Gastrointestinal negative.  Musculoskeletal: Positive for joint pain.  Neurological: Neurological negative. Hematologic: Hematologic/lymphatic negative.  Psychiatric: Psychiatric negative.          Objective:    Vitals:   11/28/21 0605  BP: (!) 138/92  Pulse: (!) 55  Resp: 17  Temp: 98.4 F (36.9 C)  SpO2: 96%      Physical Exam HENT:     Head: Normocephalic.     Mouth/Throat:     Mouth: Mucous membranes are moist.  Eyes:     Pupils: Pupils are equal, round, and reactive to light.  Cardiovascular:     Rate and Rhythm: Normal rate.     Pulses:          Femoral pulses are 2+ on the right side and 2+ on the left side.      Popliteal pulses are 3+ on the right side and 3+ on the left side.  2+ Right DP and 2+ Left PT pulses this a.m. Pulmonary:     Effort: Pulmonary effort is normal.  Abdominal:     General: Abdomen is flat.     Palpations: Abdomen is soft.  Musculoskeletal:     Right lower leg: No edema.     Left lower leg: No edema.  Skin:    General: Skin is warm and dry.     Capillary Refill: Capillary refill takes less than 2 seconds.  Neurological:     General: No focal deficit present.     Mental Status: He is alert.  Psychiatric:        Mood and Affect: Mood normal.        Data: CTA IMPRESSION: Aneurysm disease, including:   -  infrarenal abdominal aortic aneurysm estimated 5.3 cm Aortic aneurysm NOS (ICD10-I71.9).   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 No high-grade stenosis or occlusion. Aortic Atherosclerosis (ICD10-I70.0).   There is relative under filling of the right-sided tibial arteries distally as compared to the left, which may indicate late arrival of the contrast bolus versus distal occlusive disease.   Additional ancillary findings as above      Assessment/Plan:    71 year old male with 5.5 cm abdominal aortic aneurysm by my measurements in reviewing the CT scan with the patient.  I discussed the need for endovascular aneurysm repair to prevent rupture although this does not need to be done urgently.  He also has bilateral hypogastric aneurysms and we will coil and cover the right. Risks and benefits reiterated, plan for EVAR today in OR.    Lamount Bankson C. Donzetta Matters, MD [facility] Office: [phone] Pager: [phone]

## 2021-11-28 NOTE — Anesthesia Procedure Notes (Deleted)
Arterial Line Insertion Start/End7/15/2023 8:08 AM, 08/19/2021 8:08 AM Performed by: Myrtie Soman, MD, anesthesiologist  Patient location: Pre-op. Preanesthetic checklist: patient identified, IV checked, site marked, risks and benefits discussed, surgical consent, monitors and equipment checked, pre-op evaluation, timeout performed and anesthesia consent Lidocaine 1% used for infiltration radial was placed Catheter size: 20 G Hand hygiene performed  and maximum sterile barriers used   Attempts: 2 Procedure performed using ultrasound guided technique. Ultrasound Notes:anatomy identified, needle tip was noted to be adjacent to the nerve/plexus identified and no ultrasound evidence of intravascular and/or intraneural injection Following insertion, dressing applied. Post procedure assessment: normal and unchanged  Patient tolerated the procedure well with no immediate complications.

## 2021-11-28 NOTE — Anesthesia Procedure Notes (Signed)
Arterial Line Insertion Start/End11/03/2021 7:05 AM, 11/28/2021 7:20 AM Performed by: Myrtie Soman, MD, anesthesiologist  Patient location: Pre-op. Preanesthetic checklist: patient identified, IV checked, site marked, risks and benefits discussed, surgical consent, monitors and equipment checked, pre-op evaluation, timeout performed and anesthesia consent Lidocaine 1% used for infiltration Left, radial was placed Catheter size: 20 G Hand hygiene performed  and maximum sterile barriers used   Attempts: 1 Procedure performed using ultrasound guided technique. Ultrasound Notes:anatomy identified, needle tip was noted to be adjacent to the nerve/plexus identified and no ultrasound evidence of intravascular and/or intraneural injection Following insertion, dressing applied and Biopatch. Post procedure assessment: normal and unchanged  Post procedure complications: unsuccessful attempts and second provider assisted. Patient tolerated the procedure well with no immediate complications.

## 2021-11-29 LAB — BASIC METABOLIC PANEL
Anion gap: 12 (ref 5–15)
BUN: 16 mg/dL (ref 8–23)
CO2: 23 mmol/L (ref 22–32)
Calcium: 9.4 mg/dL (ref 8.9–10.3)
Chloride: 105 mmol/L (ref 98–111)
Creatinine, Ser: 1.23 mg/dL (ref 0.61–1.24)
GFR, Estimated: >60 mL/min (ref >60)
Glucose, Bld: 121 mg/dL — ABNORMAL HIGH (ref 70–99)
Potassium: 3.9 mmol/L (ref 3.5–5.1)
Sodium: 140 mmol/L (ref 135–145)

## 2021-11-29 LAB — CBC
HCT: 29.5 % — ABNORMAL LOW (ref 39.0–52.0)
Hemoglobin: 9.9 g/dL — ABNORMAL LOW (ref 13.0–17.0)
MCH: 30.4 pg (ref 26.0–34.0)
MCHC: 33.6 g/dL (ref 30.0–36.0)
MCV: 90.5 fL (ref 80.0–100.0)
Platelets: 122 10*3/uL — ABNORMAL LOW (ref 150–400)
RBC: 3.26 MIL/uL — ABNORMAL LOW (ref 4.22–5.81)
RDW: 13.8 % (ref 11.5–15.5)
WBC: 10.9 10*3/uL — ABNORMAL HIGH (ref 4.0–10.5)
nRBC: 0 % (ref 0.0–0.2)

## 2021-11-29 MED ORDER — ASPIRIN 81 MG PO CHEW: 81.0000 mg | CHEWABLE_TABLET | Freq: Every day | ORAL | Status: DC

## 2021-11-29 MED ORDER — OXYCODONE-ACETAMINOPHEN 5-325 MG PO TABS: 1.0000 | ORAL_TABLET | Freq: Four times a day (QID) | ORAL | 0 refills | Status: DC | PRN

## 2021-11-29 NOTE — TOC Transition Note (Signed)
Transition of Care [place]) - CM/SW Discharge Note   Patient Details  Name: Lonnie Hansen MRN: [id number] Date of Birth: 07-18-1950  [facility] CM/SW Contact:  Zenon Mayo, RN Phone Number: 11/29/2021, 9:52 AM   Clinical Narrative:    Patient is for dc today, [place] per MD protocol with [facility].  NCM notified Amy with [facility] of the dc today.          Patient Goals and CMS Choice        Discharge Placement                       Discharge Plan and Services                                     Social Determinants of Health (SDOH) Interventions     Readmission Risk Interventions    08/19/2021   11:21 AM  Readmission Risk Prevention Plan  Post Dischage Appt Complete  Medication Screening Complete  Transportation Screening Complete

## 2021-11-29 NOTE — Discharge Summary (Signed)
[facility] Discharge Summary   Lonnie Hansen 09-07-1950 71 y.o. male  MRN: [id number]  Admission Date: 11/28/2021  Discharge Date: 11/29/2021  Physician: Thomes Lolling*  Admission Diagnosis: AAA (abdominal aortic aneurysm) ([place]) [I71.40]   HPI:   This is a 71 y.o. male  with known abdominal aortic aneurysm now measuring nearly 5.5 cm here for follow-up from recent CT scan.  Denies any new back or abdominal pain.  No new leg pain continues to walk without limitation with the help of a cane.   Hospital Course:  The patient was admitted to the hospital and taken to the operating room on 11/28/2021 and underwent: 1.  Percutaneous access and closure of bilateral common femoral arteries 2.  Treatment of abdominal aortic aneurysm with endovascular aneurysm repair with main body left 28 x 14 x 12 extended with 18 x 9.5 cm limb and proximal extension 28 x 4.5 cm device via 16 French sheath with right limb 12 x 14 cm 3.  Coil embolization of right hypogastric artery with 20 x 14 cm x 5 and 16 x 14 cm x 2 Cook Retracta and 18 x 14 cm pushable coil    Findings:  I could not select individual branches of the right hypogastric artery easily and thus the hypogastric artery itself was coiled and at completion this did not fill with contrast.  The neck was quite angulated initially there was a type I endoleak on the right lateral sidewall and this was extended upwards with a 28 mm cuff and at completion there were no endoleak's proximally.  On the right side we extended past the hypogastric artery on the left side we stopped short of the hypogastric artery.  At completion there were dorsalis pedis pulses palpable bilaterally.   The pt tolerated the procedure well and was transported to the PACU in good condition.   By POD 1, pt was doing well.  Bilateral groins are soft; there is some mild bloody ooze on bilateral bandages.  He had easily palpable DP pulses bilaterally.  He had acute surgical  blood loss anemia and was tolerating. His renal function was normal.    He was started on baby asa this admission.  He was able to void and ambulate.  Bilateral groins were soft and he had palpable pedal pulses.  He was discharged home.     CBC    Component Value Date/Time   WBC 10.9 (H) 11/29/2021 0600   RBC 3.26 (L) 11/29/2021 0600   HGB 9.9 (L) 11/29/2021 0600   HGB 12.1 (L) 11/27/2021 1107   HGB 13.1 08/17/2018 1204   HCT 29.5 (L) 11/29/2021 0600   HCT 38.6 08/17/2018 1204   PLT 122 (L) 11/29/2021 0600   PLT 154 11/27/2021 1107   PLT 89 (LL) 08/17/2018 1204   MCV 90.5 11/29/2021 0600   MCV 92 08/17/2018 1204   MCH 30.4 11/29/2021 0600   MCHC 33.6 11/29/2021 0600   RDW 13.8 11/29/2021 0600   RDW 14.6 08/17/2018 1204   LYMPHSABS 1.1 11/27/2021 1107   LYMPHSABS 1.3 08/17/2018 1204   MONOABS 0.9 11/27/2021 1107   EOSABS 0.2 11/27/2021 1107   EOSABS 0.3 08/17/2018 1204   BASOSABS 0.0 11/27/2021 1107   BASOSABS 0.0 08/17/2018 1204    BMET    Component Value Date/Time   NA 140 11/29/2021 0600   NA 140 05/11/2019 0946   K 3.9 11/29/2021 0600   CL 105 11/29/2021 0600   CO2 23 11/29/2021 0600  GLUCOSE 121 (H) 11/29/2021 0600   BUN 16 11/29/2021 0600   BUN 17 05/11/2019 0946   CREATININE 1.23 11/29/2021 0600   CREATININE 1.25 (H) 11/27/2021 1107   CREATININE 0.75 04/10/2016 1132   CALCIUM 9.4 11/29/2021 0600   GFRNONAA >60 11/29/2021 0600   GFRNONAA >60 11/27/2021 1107   GFRAA 82 05/11/2019 0946   GFRAA >60 04/19/2018 1055       Discharge Instructions     Discharge patient   Complete by: As directed    Discharge home once pt has walked and voided.   Discharge disposition: 01-Home or Self Care   Discharge patient date: 11/29/2021       Discharge Diagnosis:  AAA (abdominal aortic aneurysm) ([place]) [I71.40]  Secondary Diagnosis: Patient Active Problem List   Diagnosis Date Noted   AAA (abdominal aortic aneurysm) ([place]) 11/28/2021   Acute blood loss  anemia (ABLA) 08/21/2021   Hyperkalemia 08/21/2021   Acute cholangitis 08/21/2021   Choledocholithiasis 08/17/2021   Elevated LFTs 08/16/2021   GI bleed 05/26/2021   Diverticulosis of intestine with bleeding 05/10/2021   Cholecystitis 12/18/2020   Hemorrhagic shock ([place])    AKI (acute kidney injury) ([place])    Cardiac arrest ([place])    GIB (gastrointestinal bleeding) 05/24/2020   Symptomatic ABLA due to lower GI bleed 04/14/2019   Unilateral primary osteoarthritis, 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   Chronic ITP (idiopathic thrombocytopenia) ([place]) 03/31/2018   OSA on CPAP 03/04/2017   Vocal cord nodule    Nocturia    Joint swelling    Joint pain    Hypertension    History of kidney stones    History of colon polyps    Headache    Glaucoma    GERD    Enlarged prostate    Aneurysm of infrarenal abdominal aorta (HCC)    Diverticulosis    Coronary artery disease    Cataracts, bilateral    Bruising    Blood dyscrasia    Arthritis    Anemia    Hydropneumothorax 10/29/2016   S/P AVR 09/15/2016   History of CAD 04/29/2016   Aortic insufficiency 04/01/2016   Thoracic aortic aneurysm ([place]) 09/18/2015   HTN (hypertension) 09/18/2015   Hyperlipidemia 09/18/2015   Asymptomatic bilateral carotid artery stenosis 08/27/2015   Past Medical History:  Diagnosis Date   AAA (abdominal aortic aneurysm) ([place])    Anemia    Arthritis    Asymptomatic bilateral carotid artery stenosis 08/2015   1-39%    Cataracts, bilateral    Chronic ITP (idiopathic thrombocytopenia) ([place]) 03/31/2018   Coronary artery disease    Diverticulosis    Enlarged aorta (HCC)    Enlarged prostate    slightly   GERD (gastroesophageal reflux disease)    takes Pantoprazole daily as needed   Glaucoma    uses eye drops daily   Headache    History of colon polyps    benign   History of kidney stones    Hyperlipidemia    no on any meds   Hypertension    takes Amlodipine and Atenolol daily   Idiopathic thrombocytopenia  (HCC)    OSA on CPAP    Vocal cord nodule    pt. states  it's a" growth on vocal cord"     Allergies as of 11/29/2021   No Known Allergies      Medication List     TAKE these medications    acetaminophen 500 MG tablet Commonly known as: TYLENOL Take 1 tablet (500 mg  total) by mouth every 8 (eight) hours as needed for fever (pain).   amLODipine 10 MG tablet Commonly known as: NORVASC Take 10 mg by mouth daily.   aspirin 81 MG chewable tablet Chew 1 tablet (81 mg total) by mouth daily.   atenolol 25 MG tablet Commonly known as: TENORMIN Take 12.5 mg by mouth 2 (two) times daily.   fluticasone 50 MCG/ACT nasal spray Commonly known as: FLONASE Place 2 sprays into both nostrils daily as needed for allergies or rhinitis.   latanoprost 0.005 % ophthalmic solution Commonly known as: XALATAN Place 1 drop into both eyes at bedtime.   multivitamin with minerals Tabs tablet Take 1 tablet by mouth daily.   omega-3 acid ethyl esters 1 g capsule Commonly known as: LOVAZA Take 2 g by mouth 2 (two) times daily.   oxyCODONE-acetaminophen 5-325 MG tablet Commonly known as: Percocet Take 1 tablet by mouth every 6 (six) hours as needed for severe pain.   pantoprazole 40 MG tablet Commonly known as: PROTONIX Take 40 mg by mouth 2 (two) times daily.   polyethylene glycol 17 g packet Commonly known as: MIRALAX / GLYCOLAX Take 17 g by mouth daily as needed for mild constipation. Every other day What changed:  when to take this additional instructions   psyllium 95 % Pack Commonly known as: HYDROCIL/METAMUCIL Take 1 packet by mouth daily as needed for mild constipation.   rosuvastatin 20 MG tablet Commonly known as: CRESTOR Take 20 mg by mouth daily.   triamcinolone 0.025 % cream Commonly known as: KENALOG Apply 1 Application topically daily as needed (skin irritation/rash.).        Discharge Instructions:  [facility]  Discharge  Instructions Endovascular Aortic Aneurysm Repair  Please refer to the following instructions for your post-procedure care. Your surgeon or Physician Assistant will discuss any changes with you.  Activity  You are encouraged to walk as much as you can. You can slowly return to normal activities but must avoid strenuous activity and heavy lifting until your doctor tells you it's OK. Avoid activities such as vacuuming or swinging a gold club. It is normal to feel tired for several weeks after your surgery. Do not drive until your doctor gives the OK and you are no longer taking prescription pain medications. It is also normal to have difficulty with sleep habits, eating, and bowel movements after surgery. These will go away with time.  Bathing/Showering  You may shower after you go home. If you have an incision, do not soak in a bathtub, hot tub, or swim until the incision heals completely.  Incision Care  Shower every day. Clean your incision with mild soap and water. Pat the area dry with a clean towel. You do not need a bandage unless otherwise instructed. Do not apply any ointments or creams to your incision. If you clothing is irritating, you may cover your incision with a dry gauze pad.  Diet  Resume your normal diet. There are no special food restrictions following this procedure. A low fat/low cholesterol diet is recommended for all patients with vascular disease. In order to heal from your surgery, it is CRITICAL to get adequate nutrition. Your body requires vitamins, minerals, and protein. Vegetables are the best source of vitamins and minerals. Vegetables also provide the perfect balance of protein. Processed food has little nutritional value, so try to avoid this.  Medications  Resume taking all of your medications unless your doctor or Physician Assistnat tells  you not to. If your incision is causing pain, you may take over-the-counter pain relievers such as acetaminophen (Tylenol).  If you were prescribed a stronger pain medication, please be aware these medications can cause nausea and constipation. Prevent nausea by taking the medication with a snack or meal. Avoid constipation by drinking plenty of fluids and eating foods with a high amount of fiber, such as fruits, vegetables, and grains.  Do not take Tylenol if you are taking prescription pain medications.   Follow up  [place] office will schedule a follow-up appointment with a C.T. scan 3-4 weeks after your surgery.  Please call us immediately for any of the following conditions  Severe or worsening pain in your legs or feet or in your abdomen back or chest. Increased pain, redness, drainage (pus) from your incision sit. Increased abdominal pain, bloating, nausea, vomiting or persistent diarrhea. Fever of 101 degrees or higher. Swelling in your leg (s),  Reduce your risk of vascular disease  Stop smoking. If you would like help call [facility] at 1-800-QUIT-NOW 858-565-5382) or [place] at [phone]. Manage your cholesterol Maintain a desired weight Control your diabetes Keep your blood pressure down  If you have questions, please call the office at [phone].   Prescriptions given: 1.  Roxicet #15 No Refill 2.  Asa '81mg'$  daily OTC  Disposition: home  Patient's condition: is Good  Follow up: 1. Dr. Donzetta Matters in 4 weeks with CTA protocol   Leontine Locket, PA-C [facility] [phone] 11/29/2021  9:26 AM   - For VQI Registry use - Post-op:  Time to Extubation: '[x]'$  In OR, '[ ]'$  < 12 hrs, '[ ]'$  12-24 hrs, '[ ]'$  >=24 hrs Vasopressors Req. Post-op: No MI: No., '[ ]'$  Troponin only, '[ ]'$  EKG or Clinical New Arrhythmia: No CHF: No ICU Stay: 1 day in progressive Transfusion: No     If yes, n/a units given  Complications: Resp failure: No., '[ ]'$  Pneumonia, '[ ]'$  Ventilator Chg in renal function: No., '[ ]'$  Inc. Cr > 0.5, '[ ]'$  Temp. Dialysis,  '[ ]'$  Permanent dialysis Leg ischemia: No.,  no Surgery needed, '[ ]'$  Yes, Surgery needed,  '[ ]'$  Amputation Bowel ischemia: No., '[ ]'$  Medical Rx, '[ ]'$  Surgical Rx Wound complication: No., '[ ]'$  Superficial separation/infection, '[ ]'$  Return to OR Return to OR: No  Return to OR for bleeding: No Stroke: No., '[ ]'$  Minor, '[ ]'$  Major  Discharge medications: Statin use:  Yes  ASA use:  Yes  Plavix use:  No  Beta blocker use:  Yes  ARB use:  No ACEI use:  No CCB use:  Yes

## 2021-11-29 NOTE — Progress Notes (Addendum)
Progress Note    11/29/2021 6:48 AM 1 Day Post-Op  Subjective:  says he has voided a little.  Has not been out of bed.  Otherwise, no complaints.    Afebrile HR 50's-70's NSR 622'W-979'G systolic 921% RA  Vitals:   11/29/21 0305 11/29/21 0400  BP: 120/73 125/67  Pulse: (!) 57 (!) 53  Resp: 15 15  Temp:    SpO2: 96% 97%    Physical Exam: Cardiac:  regular Lungs:  non labored Incisions:  bilateral groins with some mild bloody ooze on the dressings but groins are soft Extremities:  easily palpable bilateral DP pulses Abdomen:  soft, NT  CBC    Component Value Date/Time   WBC 10.1 11/28/2021 1120   RBC 3.49 (L) 11/28/2021 1120   HGB 10.7 (L) 11/28/2021 1120   HGB 12.1 (L) 11/27/2021 1107   HGB 13.1 08/17/2018 1204   HCT 31.8 (L) 11/28/2021 1120   HCT 38.6 08/17/2018 1204   PLT 121 (L) 11/28/2021 1120   PLT 154 11/27/2021 1107   PLT 89 (LL) 08/17/2018 1204   MCV 91.1 11/28/2021 1120   MCV 92 08/17/2018 1204   MCH 30.7 11/28/2021 1120   MCHC 33.6 11/28/2021 1120   RDW 13.9 11/28/2021 1120   RDW 14.6 08/17/2018 1204   LYMPHSABS 1.1 11/27/2021 1107   LYMPHSABS 1.3 08/17/2018 1204   MONOABS 0.9 11/27/2021 1107   EOSABS 0.2 11/27/2021 1107   EOSABS 0.3 08/17/2018 1204   BASOSABS 0.0 11/27/2021 1107   BASOSABS 0.0 08/17/2018 1204    BMET    Component Value Date/Time   NA 140 11/28/2021 1120   NA 140 05/11/2019 0946   K 3.3 (L) 11/28/2021 1120   CL 105 11/28/2021 1120   CO2 26 11/28/2021 1120   GLUCOSE 122 (H) 11/28/2021 1120   BUN 11 11/28/2021 1120   BUN 17 05/11/2019 0946   CREATININE 1.06 11/28/2021 1120   CREATININE 1.25 (H) 11/27/2021 1107   CREATININE 0.75 04/10/2016 1132   CALCIUM 9.1 11/28/2021 1120   GFRNONAA >60 11/28/2021 1120   GFRNONAA >60 11/27/2021 1107   GFRAA 82 05/11/2019 0946   GFRAA >60 04/19/2018 1055    INR    Component Value Date/Time   INR 1.2 11/28/2021 1120     Intake/Output Summary (Last 24 hours) at 11/29/2021  0648 Last data filed at 11/29/2021 0229 Gross per 24 hour  Intake 1550 ml  Output 2450 ml  Net -900 ml     Assessment/Plan:  71 y.o. male is s/p:  1.  Percutaneous access and closure of bilateral common femoral arteries 2.  Treatment of abdominal aortic aneurysm with endovascular aneurysm repair with main body left 28 x 14 x 12 extended with 18 x 9.5 cm limb and proximal extension 28 x 4.5 cm device via 16 French sheath with right limb 12 x 14 cm 3.  Coil embolization of right hypogastric artery with 20 x 14 cm x 5 and 16 x 14 cm x 2 Cook Retracta and 18 x 14 cm pushable coil  1 Day Post-Op   -pt doing well this am; bilateral groins are soft; there is some mild bloody ooze on bilateral bandages.  -easily palpable DP pulses bilaterally -acute surgical blood loss anemia-pt tolerating -creatinine normal -DVT prophylaxis:  sq heparin -discharge later this morning.  Pt normally walks with a cane and sometimes a walker and he has these at home.   -pt has been started on baby asa this admission  Leontine Locket, PA-C [facility] [phone] 11/29/2021 6:48 AM  VASCULAR STAFF ADDENDUM: I have independently interviewed and examined the patient. I agree with the above.  Voided. Groins soft, palpable pulses, home today  Cassandria Santee, MD [facility] Phone Number: [phone] 11/29/2021 10:30 AM

## 2021-12-02 ENCOUNTER — Encounter (HOSPITAL_COMMUNITY): Payer: Self-pay | Admitting: Vascular Surgery

## 2021-12-02 LAB — TYPE AND SCREEN
ABO/RH(D): AB POS
Antibody Screen: POSITIVE
Donor AG Type: NEGATIVE
Donor AG Type: NEGATIVE
PT AG Type: NEGATIVE
Unit division: 0
Unit division: 0

## 2021-12-02 LAB — BPAM RBC
Blood Product Expiration Date: 202311192359
Blood Product Expiration Date: 202311192359
ISSUE DATE / TIME: 202310180849
Unit Type and Rh: 6200
Unit Type and Rh: 6200

## 2021-12-05 ENCOUNTER — Inpatient Hospital Stay: Payer: Medicare Other

## 2021-12-05 DIAGNOSIS — D693 Immune thrombocytopenic purpura: Secondary | ICD-10-CM | POA: Diagnosis not present

## 2021-12-05 DIAGNOSIS — Z79899 Other long term (current) drug therapy: Secondary | ICD-10-CM | POA: Diagnosis not present

## 2021-12-05 DIAGNOSIS — D509 Iron deficiency anemia, unspecified: Secondary | ICD-10-CM

## 2021-12-05 LAB — CBC WITH DIFFERENTIAL (CANCER CENTER ONLY)
Abs Immature Granulocytes: 0.07 10*3/uL (ref 0.00–0.07)
Basophils Absolute: 0 10*3/uL (ref 0.0–0.1)
Basophils Relative: 0 %
Eosinophils Absolute: 0.2 10*3/uL (ref 0.0–0.5)
Eosinophils Relative: 2 %
HCT: 30.4 % — ABNORMAL LOW (ref 39.0–52.0)
Hemoglobin: 9.8 g/dL — ABNORMAL LOW (ref 13.0–17.0)
Immature Granulocytes: 1 %
Lymphocytes Relative: 13 %
Lymphs Abs: 1 10*3/uL (ref 0.7–4.0)
MCH: 30 pg (ref 26.0–34.0)
MCHC: 32.2 g/dL (ref 30.0–36.0)
MCV: 93 fL (ref 80.0–100.0)
Monocytes Absolute: 0.7 10*3/uL (ref 0.1–1.0)
Monocytes Relative: 10 %
Neutro Abs: 5.5 10*3/uL (ref 1.7–7.7)
Neutrophils Relative %: 74 %
Platelet Count: 126 10*3/uL — ABNORMAL LOW (ref 150–400)
RBC: 3.27 MIL/uL — ABNORMAL LOW (ref 4.22–5.81)
RDW: 13.9 % (ref 11.5–15.5)
WBC Count: 7.5 10*3/uL (ref 4.0–10.5)
nRBC: 0 % (ref 0.0–0.2)

## 2021-12-05 LAB — CMP (CANCER CENTER ONLY)
ALT: 13 U/L (ref 0–44)
AST: 21 U/L (ref 15–41)
Albumin: 4.4 g/dL (ref 3.5–5.0)
Alkaline Phosphatase: 41 U/L (ref 38–126)
Anion gap: 11 (ref 5–15)
BUN: 14 mg/dL (ref 8–23)
CO2: 27 mmol/L (ref 22–32)
Calcium: 9.6 mg/dL (ref 8.9–10.3)
Chloride: 103 mmol/L (ref 98–111)
Creatinine: 1.25 mg/dL — ABNORMAL HIGH (ref 0.61–1.24)
GFR, Estimated: >60 mL/min (ref >60)
Glucose, Bld: 119 mg/dL — ABNORMAL HIGH (ref 70–99)
Potassium: 3.6 mmol/L (ref 3.5–5.1)
Sodium: 141 mmol/L (ref 135–145)
Total Bilirubin: 0.8 mg/dL (ref 0.3–1.2)
Total Protein: 7.6 g/dL (ref 6.5–8.1)

## 2021-12-05 NOTE — Progress Notes (Signed)
Per [name], no N-Plate needed today. Platelet count is 126 today. Labs reviewed with patient. He verbalized understanding.

## 2021-12-12 ENCOUNTER — Inpatient Hospital Stay: Payer: Medicare Other

## 2021-12-12 VITALS — BP 153/77 | HR 54 | Temp 98.3°F | Resp 18

## 2021-12-12 DIAGNOSIS — Z79899 Other long term (current) drug therapy: Secondary | ICD-10-CM | POA: Diagnosis not present

## 2021-12-12 DIAGNOSIS — D693 Immune thrombocytopenic purpura: Secondary | ICD-10-CM | POA: Diagnosis not present

## 2021-12-12 DIAGNOSIS — D509 Iron deficiency anemia, unspecified: Secondary | ICD-10-CM

## 2021-12-12 LAB — CMP (CANCER CENTER ONLY)
ALT: 10 U/L (ref 0–44)
AST: 19 U/L (ref 15–41)
Albumin: 4.5 g/dL (ref 3.5–5.0)
Alkaline Phosphatase: 47 U/L (ref 38–126)
Anion gap: 8 (ref 5–15)
BUN: 11 mg/dL (ref 8–23)
CO2: 29 mmol/L (ref 22–32)
Calcium: 9.4 mg/dL (ref 8.9–10.3)
Chloride: 101 mmol/L (ref 98–111)
Creatinine: 1.23 mg/dL (ref 0.61–1.24)
GFR, Estimated: >60 mL/min (ref >60)
Glucose, Bld: 110 mg/dL — ABNORMAL HIGH (ref 70–99)
Potassium: 3.9 mmol/L (ref 3.5–5.1)
Sodium: 138 mmol/L (ref 135–145)
Total Bilirubin: 0.8 mg/dL (ref 0.3–1.2)
Total Protein: 7.4 g/dL (ref 6.5–8.1)

## 2021-12-12 LAB — CBC WITH DIFFERENTIAL (CANCER CENTER ONLY)
Abs Immature Granulocytes: 0.05 10*3/uL (ref 0.00–0.07)
Basophils Absolute: 0 10*3/uL (ref 0.0–0.1)
Basophils Relative: 0 %
Eosinophils Absolute: 0.2 10*3/uL (ref 0.0–0.5)
Eosinophils Relative: 3 %
HCT: 31.7 % — ABNORMAL LOW (ref 39.0–52.0)
Hemoglobin: 10.1 g/dL — ABNORMAL LOW (ref 13.0–17.0)
Immature Granulocytes: 1 %
Lymphocytes Relative: 14 %
Lymphs Abs: 1.1 10*3/uL (ref 0.7–4.0)
MCH: 29.9 pg (ref 26.0–34.0)
MCHC: 31.9 g/dL (ref 30.0–36.0)
MCV: 93.8 fL (ref 80.0–100.0)
Monocytes Absolute: 0.8 10*3/uL (ref 0.1–1.0)
Monocytes Relative: 10 %
Neutro Abs: 5.6 10*3/uL (ref 1.7–7.7)
Neutrophils Relative %: 72 %
Platelet Count: 95 10*3/uL — ABNORMAL LOW (ref 150–400)
RBC: 3.38 MIL/uL — ABNORMAL LOW (ref 4.22–5.81)
RDW: 14.5 % (ref 11.5–15.5)
WBC Count: 7.7 10*3/uL (ref 4.0–10.5)
nRBC: 0 % (ref 0.0–0.2)

## 2021-12-12 MED ORDER — ROMIPLOSTIM 125 MCG ~~LOC~~ SOLR
95.0000 ug | Freq: Once | SUBCUTANEOUS | Status: AC
  Administered 2021-12-12: 95 ug via SUBCUTANEOUS
  Filled 2021-12-12: qty 0.19

## 2021-12-12 NOTE — Patient Instructions (Signed)
Romiplostim Injection What is this medication? ROMIPLOSTIM (roe mi PLOE stim) treats low levels of platelets in your body caused by immune thrombocytopenia (ITP). It is prescribed when other medications have not worked or cannot be tolerated. It may also be used to help people who have been exposed to high doses of radiation. It works by increasing the amount of platelets in your blood. This lowers the risk of bleeding. This medicine may be used for other purposes; ask your health care provider or pharmacist if you have questions. COMMON BRAND NAME(S): Nplate What should I tell my care team before I take this medication? They need to know if you have any of these conditions: Blood clots Myelodysplastic syndrome An unusual or allergic reaction to romiplostim, mannitol, other medications, foods, dyes, or preservatives Pregnant or trying to get pregnant Breast-feeding How should I use this medication? This medication is injected under the skin. It is given by a care team in a hospital or clinic setting. A special MedGuide will be given to you before each treatment. Be sure to read this information carefully each time. Talk to your care team about the use of this medication in children. While it may be prescribed for children as young as newborns for selected conditions, precautions do apply. Overdosage: If you think you have taken too much of this medicine contact a poison control center or emergency room at once. NOTE: This medicine is only for you. Do not share this medicine with others. What if I miss a dose? Keep appointments for follow-up doses. It is important not to miss your dose. Call your care team if you are unable to keep an appointment. What may interact with this medication? Interactions are not expected. This list may not describe all possible interactions. Give your health care provider a list of all the medicines, herbs, non-prescription drugs, or dietary supplements you use. Also  tell them if you smoke, drink alcohol, or use illegal drugs. Some items may interact with your medicine. What should I watch for while using this medication? Visit your care team for regular checks on your progress. You may need blood work done while you are taking this medication. Your condition will be monitored carefully while you are receiving this medication. It is important not to miss any appointments. What side effects may I notice from receiving this medication? Side effects that you should report to your care team as soon as possible: Allergic reactions--skin rash, itching, hives, swelling of the face, lips, tongue, or throat Blood clot--pain, swelling, or warmth in the leg, shortness of breath, chest pain Side effects that usually do not require medical attention (report to your care team if they continue or are bothersome): Dizziness Joint pain Muscle pain Pain in the hands or feet Stomach pain Trouble sleeping This list may not describe all possible side effects. Call your doctor for medical advice about side effects. You may report side effects to FDA at 1-800-FDA-1088. Where should I keep my medication? This medication is given in a hospital or clinic. It will not be stored at home. NOTE: This sheet is a summary. It may not cover all possible information. If you have questions about this medicine, talk to your doctor, pharmacist, or health care provider.  2023 Elsevier/Gold Standard (2021-04-22 00:00:00)  

## 2021-12-22 ENCOUNTER — Inpatient Hospital Stay: Payer: Medicare Other

## 2021-12-22 VITALS — BP 158/89 | HR 66 | Temp 98.4°F | Resp 18

## 2021-12-22 DIAGNOSIS — Z79899 Other long term (current) drug therapy: Secondary | ICD-10-CM | POA: Diagnosis not present

## 2021-12-22 DIAGNOSIS — D693 Immune thrombocytopenic purpura: Secondary | ICD-10-CM | POA: Diagnosis not present

## 2021-12-22 DIAGNOSIS — D509 Iron deficiency anemia, unspecified: Secondary | ICD-10-CM

## 2021-12-22 LAB — CBC WITH DIFFERENTIAL (CANCER CENTER ONLY)
Abs Immature Granulocytes: 0.03 10*3/uL (ref 0.00–0.07)
Basophils Absolute: 0 10*3/uL (ref 0.0–0.1)
Basophils Relative: 0 %
Eosinophils Absolute: 0.3 10*3/uL (ref 0.0–0.5)
Eosinophils Relative: 4 %
HCT: 35 % — ABNORMAL LOW (ref 39.0–52.0)
Hemoglobin: 11.3 g/dL — ABNORMAL LOW (ref 13.0–17.0)
Immature Granulocytes: 0 %
Lymphocytes Relative: 17 %
Lymphs Abs: 1.2 10*3/uL (ref 0.7–4.0)
MCH: 30.3 pg (ref 26.0–34.0)
MCHC: 32.3 g/dL (ref 30.0–36.0)
MCV: 93.8 fL (ref 80.0–100.0)
Monocytes Absolute: 1 10*3/uL (ref 0.1–1.0)
Monocytes Relative: 14 %
Neutro Abs: 4.4 10*3/uL (ref 1.7–7.7)
Neutrophils Relative %: 65 %
Platelet Count: 96 10*3/uL — ABNORMAL LOW (ref 150–400)
RBC: 3.73 MIL/uL — ABNORMAL LOW (ref 4.22–5.81)
RDW: 14.6 % (ref 11.5–15.5)
WBC Count: 6.9 10*3/uL (ref 4.0–10.5)
nRBC: 0 % (ref 0.0–0.2)

## 2021-12-22 LAB — CMP (CANCER CENTER ONLY)
ALT: 7 U/L (ref 0–44)
AST: 18 U/L (ref 15–41)
Albumin: 4.8 g/dL (ref 3.5–5.0)
Alkaline Phosphatase: 48 U/L (ref 38–126)
Anion gap: 6 (ref 5–15)
BUN: 17 mg/dL (ref 8–23)
CO2: 31 mmol/L (ref 22–32)
Calcium: 10.1 mg/dL (ref 8.9–10.3)
Chloride: 102 mmol/L (ref 98–111)
Creatinine: 1.3 mg/dL — ABNORMAL HIGH (ref 0.61–1.24)
GFR, Estimated: 58 mL/min — ABNORMAL LOW (ref >60)
Glucose, Bld: 108 mg/dL — ABNORMAL HIGH (ref 70–99)
Potassium: 4.9 mmol/L (ref 3.5–5.1)
Sodium: 139 mmol/L (ref 135–145)
Total Bilirubin: 0.9 mg/dL (ref 0.3–1.2)
Total Protein: 7.4 g/dL (ref 6.5–8.1)

## 2021-12-22 MED ORDER — ROMIPLOSTIM 125 MCG ~~LOC~~ SOLR
95.0000 ug | Freq: Once | SUBCUTANEOUS | Status: AC
  Administered 2021-12-22: 95 ug via SUBCUTANEOUS
  Filled 2021-12-22: qty 0.19

## 2021-12-23 ENCOUNTER — Other Ambulatory Visit: Payer: Self-pay

## 2021-12-23 DIAGNOSIS — I714 Abdominal aortic aneurysm, without rupture, unspecified: Secondary | ICD-10-CM

## 2021-12-26 ENCOUNTER — Ambulatory Visit (HOSPITAL_COMMUNITY)
Admission: RE | Admit: 2021-12-26 | Discharge: 2021-12-26 | Disposition: A | Discharge status: Home / Self Care | Payer: Medicare Other | Source: Ambulatory Visit | Attending: Vascular Surgery | Admitting: Vascular Surgery

## 2021-12-26 DIAGNOSIS — I714 Abdominal aortic aneurysm, without rupture, unspecified: Secondary | ICD-10-CM | POA: Insufficient documentation

## 2021-12-26 DIAGNOSIS — N281 Cyst of kidney, acquired: Secondary | ICD-10-CM | POA: Diagnosis not present

## 2021-12-26 MED ORDER — IOHEXOL 350 MG/ML SOLN
75.0000 mL | Freq: Once | INTRAVENOUS | Status: AC | PRN
  Administered 2021-12-26: 75 mL via INTRAVENOUS

## 2021-12-30 NOTE — Progress Notes (Unsigned)
Cardiology Office Note    Date:  01/01/2022   ID:  Meldon, Hanzlik May 11, 1950, MRN [id number]  PCP:  [name], MD  Cardiologist:  Dr. Martinique   Chief Complaint  Patient presents with   Coronary Artery Disease   avr    History of Present Illness:  Lonnie Hansen is a 71 y.o. male is seen for follow up CAD.  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 In 2017 chest x-ray showed aortic root enlargement. He underwent CT that showed dilatation of proximal aorta. Descending aorta was normal. Echocardiogram obtained on 09/20/2015 showed EF 60-65%, moderate to severe AI, grade 1 DD. He was evaluated by Dr. Servando Snare. CT scan showed aortic root had enlarged to 5.2 cm. Given the size of aortic root and the degree of AI, it was recommended he undergo aortic root grafting and AVR. He underwent preoperative evaluation with cardiac catheterization on 04/14/2016 which showed moderate to severe AI, EF 45%, 80% stenosis in a nondominant RCA, 50% distal LAD. He eventually underwent the planned procedure with ascending aortic root replacement using 32 mm Gelweave Valsalva Graft and AVR using 29 mm Edwards Perimount Magna Ease aortic bioprosthesis valve on 09/15/2016 by Dr. Servando Snare.   When seen in follow up chest x-ray showed recurrent right pleural effusion.Echo on 10/09/2016 showed only a trivial amount of pericardial effusion, EF 60-65%. He underwent right thoracentesis again on 10/23/2016 with the removal 2 L of dark blood. Post procedure, he developed a right pneumothorax related to incomplete reexpansion of the right middle and lower lobe of the lung. He was admitted on 10/30/2016 for pigtail drain placement,  chest x-ray obtained on 11/04/2016 showed improvement of right pleural effusion and resolution of right pneumothorax.  Patient was admitted in March 2021 with lower GI bleed. Upper EGD normal. Colonoscopy showed extensive  diverticulosis but no active bleeding.   He was seen in April 2021 with symptoms of lightheadedness. Echo was unremarkable. Normal EF and normal AV prosthesis function.   He was admitted again in April 2022 with recurrent GI bleed. EGD and sigmoidoscopy done. Apparently had cardiac arrest requiring CPR and Epi with ROSC.  Bleeding scan showed uptake in the rectum. He was transfused. He was readmitted with recurrent bleed in May. Again transfused. Colonoscopy showed blood throughout the colon. 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 BP was high and pulse was low at 43. Atenolol dose was reduced and losartan increased.  A Zio patch monitor was placed. He was concerned that he had coronary blockage so a Myoview study was ordered. This demonstrated ischemia in the inferior and apical region with EF 42%. Most recent platelet count was 99K.  Since he was asymptomatic and at high bleeding risk further invasive evaluation was deferred. Echo done 09/16/20 with EF 65-70%, mild LVH, no RWMA, RV normal, mild MR, S/P AVR with 29 mm MagnaEase bioprosthesis (09/15/16) Peak and mean gradients throuugh the valve are 22 and 11 mm Hg respectively. No significant change from report of April 2021. . The aortic valve has been repaired/replaced. Aortic valve regurgitation is not visualized.     He was admitted 12/18/20 with acute cholecystitis. Underwent lap Choly and umbilical hernia repair without complications. He notes losartan HCT was not resumed post op. I last saw him in Dec  and he was recovering well. Seen by Dr Donzetta Matters in March for follow up of AAA.   He was admitted 4/15-4/19/23 with acute lower GI bleed due to bleeding diverticulum. Was transfused. Hgb down to 7.7. He was readmitted 5/1/-05/30/21 with recurrent bleed and was transfused 3 units PRBCs. Ended up having an arterial embolization procedure. Patient states he did not want colectomy.   He was  seen by Dr Cyndia Bent on 06/17/21 for follow up of his thoracic aneurysm. CT was stable measuring 4.7 cm beyond the prior repair. Plan follow up in one year.   Recently on 11/28/21 he underwent endovascular repair of a AAA by Dr Donzetta Matters.   On follow up today he is doing well. He denies any chest pain or dyspnea. [facility]. No recurrent bleeding issues. No edema.     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 states  it's a" growth on vocal cord"    Past Surgical History:  Procedure Laterality Date   ABDOMINAL AORTIC ENDOVASCULAR STENT GRAFT N/A 11/28/2021   Procedure: ABDOMINAL AORTIC ENDOVASCULAR STENT GRAFT;  Surgeon: Waynetta Sandy, MD;  Location: [place];  Service: Vascular;  Laterality: N/A;   ANEURYSM COILING Right 11/28/2021   Procedure: ANEURYSM COILING RIGHT INTERNAL ILIAC;  Surgeon: Waynetta Sandy, MD;  Location: [place];  Service: Vascular;  Laterality: Right;   AORTIC ARCH ANGIOGRAPHY N/A 04/16/2016   Procedure: Aortic Arch Angiography;  Surgeon: Daurice Ovando M Martinique, MD;  Location: [place] [facility];  Service: Cardiovascular;  Laterality: N/A;   AORTIC VALVE REPLACEMENT N/A 09/15/2016   Procedure: AORTIC VALVE REPLACEMENT (AVR);  Surgeon: Grace Isaac, MD;   Location: [place];  Service: Open Heart Surgery;  Laterality: N/A;  Using 40m Edwards Perimount Magna Ease Aortic Bioprosthesis Valve   ASCENDING AORTIC ROOT REPLACEMENT N/A 09/15/2016   Procedure: ASCENDING AORTIC ROOT REPLACEMENT;  Surgeon: GGrace Isaac MD;  [place]  Service: Open Heart Surgery;  Laterality: N/A;  Using 359mGelweave Valsalva Graft   CHOLECYSTECTOMY N/A 12/19/2020   Procedure: LAPAROSCOPIC CHOLECYSTECTOMY WITH POSSIBLE INTRAOPERATIVE CHOLANGIOGRAM;  Surgeon: WiGreer PickerelMD;  [place] [facility] [place]: [facility];  Laterality: N/A;   COLONOSCOPY     COLONOSCOPY Left 04/16/2019   Procedure: COLONOSCOPY;  Surgeon: OuArta SilenceMD;  [facility];  Service: Endoscopy;  Laterality: Left;   COLONOSCOPY N/A 06/01/2020   Procedure: COLONOSCOPY;  Surgeon: KaRonnette JuniperMD;  [place] [facility] Service: Gastroenterology;  Laterality: N/A;   COLONOSCOPY WITH ESOPHAGOGASTRODUODENOSCOPY (EGD)     COLONOSCOPY WITH PROPOFOL N/A 05/14/2021  Procedure: COLONOSCOPY WITH PROPOFOL;  Surgeon: Ronnette Juniper, MD;  [facility];  Service: Gastroenterology;  Laterality: N/A;   ERCP N/A 08/19/2021   Procedure: ENDOSCOPIC RETROGRADE CHOLANGIOPANCREATOGRAPHY (ERCP);  Surgeon: Clarene Essex, MD;  [facility];  Service: Gastroenterology;  Laterality: N/A;  at 1:30 pm   ESOPHAGOGASTRODUODENOSCOPY N/A 05/25/2020   Procedure: ESOPHAGOGASTRODUODENOSCOPY (EGD);  Surgeon: Wilford Corner, MD;  Location: [place];  Service: Endoscopy;  Laterality: N/A;   ESOPHAGOGASTRODUODENOSCOPY N/A 08/19/2021   Procedure: ESOPHAGOGASTRODUODENOSCOPY (EGD);  Surgeon: Otis Brace, MD;  Location[facility];  Service: Gastroenterology;  Laterality: N/A;   ESOPHAGOGASTRODUODENOSCOPY (EGD) WITH PROPOFOL Left 04/16/2019   Procedure: ESOPHAGOGASTRODUODENOSCOPY (EGD) WITH PROPOFOL;  Surgeon: Arta Silence, MD;  [facility];  Service: Endoscopy;  Laterality: Left;    ESOPHAGOGASTRODUODENOSCOPY (EGD) WITH PROPOFOL N/A 08/23/2021   Procedure: ESOPHAGOGASTRODUODENOSCOPY (EGD) WITH PROPOFOL;  Surgeon: Ronnette Juniper, MD;  [facility];  Service: Gastroenterology;  Laterality: N/A;   FLEXIBLE SIGMOIDOSCOPY N/A 05/25/2020   Procedure: FLEXIBLE SIGMOIDOSCOPY;  Surgeon: Wilford Corner, MD;  Location: [place];  Service: Endoscopy;  Laterality: N/A;   HEMOSTASIS CONTROL  08/19/2021   Procedure: HEMOSTASIS CONTROL;  Surgeon: Otis Brace, MD;  [facility];  Service: Gastroenterology;;   IR [place] VESSEL  05/26/2021   IR ANGIOGRAM SELECTIVE EACH ADDITIONAL VESSEL  08/20/2021   IR ANGIOGRAM SELECTIVE EACH ADDITIONAL VESSEL  08/20/2021   IR ANGIOGRAM VISCERAL SELECTIVE  05/26/2021   IR ANGIOGRAM VISCERAL SELECTIVE  08/20/2021   IR EMBO ART  VEN HEMORR LYMPH EXTRAV  INC GUIDE ROADMAPPING  05/26/2021   IR EMBO ART  VEN HEMORR LYMPH EXTRAV  INC GUIDE ROADMAPPING  08/20/2021   IR FLUORO GUIDE CV LINE RIGHT  08/20/2021   IR THORACENTESIS ASP PLEURAL SPACE W/IMG GUIDE  10/23/2016   IR US GUIDE VASC ACCESS LEFT  05/26/2021   IR US GUIDE VASC ACCESS RIGHT  05/26/2021   IR US GUIDE VASC ACCESS RIGHT  08/20/2021   IR US GUIDE VASC ACCESS RIGHT  08/20/2021   MICROLARYNGOSCOPY Right 09/20/2017   Procedure: MICROLARYNGOSCOPY WITH  EXCISION OF VOCAL CORD LESION;  Surgeon: Leta Baptist, MD;  Location: [place];  Service: ENT;  Laterality: Right;   MULTIPLE EXTRACTIONS WITH ALVEOLOPLASTY N/A 06/10/2016   Procedure: Extraction of tooth #'s 2,8,13,15, and 29  with alveoloplasty, maxillary right and left buccal exostoses reductions, and gross debridement of remaining teeth.;  Surgeon: Lenn Cal, DDS;  Location: [place];  Service: Oral Surgery;  Laterality: N/A;   POLYPECTOMY  05/14/2021   Procedure: POLYPECTOMY;  Surgeon: Ronnette Juniper, MD;  Location[facility];  Service: Gastroenterology;;   REMOVAL OF STONES  08/19/2021    Procedure: REMOVAL OF STONES;  Surgeon: Clarene Essex, MD;  [facility];  Service: Gastroenterology;;   RIGHT/LEFT HEART CATH AND CORONARY ANGIOGRAPHY N/A 04/16/2016   Procedure: Right/Left Heart Cath and Coronary Angiography;  Surgeon: Marche Hottenstein M Martinique, MD;  Location: [place] [facility];  Service: Cardiovascular;  Laterality: N/A;   SPHINCTEROTOMY  08/19/2021   Procedure: SPHINCTEROTOMY;  Surgeon: Clarene Essex, MD;  [facility];  Service: Gastroenterology;;   TEE WITHOUT CARDIOVERSION N/A 09/15/2016   Procedure: TRANSESOPHAGEAL ECHOCARDIOGRAM (TEE);  Surgeon: Grace Isaac, MD;  Location: [place];  Service: Open Heart Surgery;  Laterality: N/A;    Current Medications: Outpatient Medications Prior to Visit  Medication Sig Dispense Refill   acetaminophen (TYLENOL) 500 MG tablet Take 1 tablet (500 mg total) by mouth every  8 (eight) hours as needed for fever (pain).     amLODipine (NORVASC) 10 MG tablet Take 10 mg by mouth daily.     amoxicillin-clavulanate (AUGMENTIN) 875-125 MG tablet Take 1 tablet by mouth 2 (two) times daily.     aspirin 81 MG chewable tablet Chew 1 tablet (81 mg total) by mouth daily.     atenolol (TENORMIN) 25 MG tablet Take 12.5 mg by mouth 2 (two) times daily. 180 tablet 3   benzonatate (TESSALON) 100 MG capsule Take 100 mg by mouth 3 (three) times daily.     fluticasone (FLONASE) 50 MCG/ACT nasal spray Place 2 sprays into both nostrils daily as needed for allergies or rhinitis.     latanoprost (XALATAN) 0.005 % ophthalmic solution Place 1 drop into both eyes at bedtime.     Multiple Vitamin (MULTIVITAMIN WITH MINERALS) TABS tablet Take 1 tablet by mouth daily.     omega-3 acid ethyl esters (LOVAZA) 1 g capsule Take 2 g by mouth 2 (two) times daily.     pantoprazole (PROTONIX) 40 MG tablet Take 40 mg by mouth 2 (two) times daily.     polyethylene glycol (MIRALAX / GLYCOLAX) 17 g packet Take 17 g by mouth daily as needed for mild constipation. Every other  day (Patient taking differently: Take 17 g by mouth every other day as needed for mild constipation.)     psyllium (HYDROCIL/METAMUCIL) 95 % PACK Take 1 packet by mouth daily as needed for mild constipation.     rosuvastatin (CRESTOR) 20 MG tablet Take 20 mg by mouth daily.     triamcinolone (KENALOG) 0.025 % cream Apply 1 Application topically daily as needed (skin irritation/rash.).     No facility-administered medications prior to visit.     Allergies:   Patient has no known allergies.   Social History   Socioeconomic History   Marital status: Married    Spouse name: Not on file   Number of children: 1   Years of education: Not on file   Highest education level: Not on file  Occupational History   Occupation: Musician  Tobacco Use   Smoking status: Former    Packs/day: 1.00    Years: 25.00    Total pack years: 25.00    Types: Cigarettes   Smokeless tobacco: Never  Vaping Use   Vaping Use: Never used  Substance and Sexual Activity   Alcohol use: No    Alcohol/week: 0.0 standard drinks of alcohol   Drug use: No   Sexual activity: Not on file  Other Topics Concern   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   Social Determinants of Health   Financial Resource Strain: Not on file  Food Insecurity: No Food Insecurity (11/28/2021)   Hunger Vital Sign    Worried About Running Out of Food in the Last Year: Never true    Ran Out of Food in the Last Year: Never true  Transportation Needs: No Transportation Needs (11/28/2021)   PRAPARE - Hydrologist (Medical): No    Lack of Transportation (Non-Medical): No  Physical Activity: Not on file  Stress: Not on file  Social Connections: Not on file     Family History:  The patient's family history includes Heart attack in his father; Heart disease in his father; Lymphoma in his mother; Thyroid disease in his sister.   ROS:  Please see the  history of present illness.    ROS All other systems reviewed and are negative.   PHYSICAL EXAM:   VS:  BP (!) 142/80   Pulse (!) 59   Ht '5\' 8"'$  (1.727 m)   Wt 196 lb 12.8 oz (89.3 kg)   SpO2 97%   BMI 29.92 kg/m    GENERAL:  Well appearing, obese BM in NAD. Walks with a cane. HEENT:  PERRL, EOMI, sclera are clear. Oropharynx is clear. NECK:  No jugular venous distention, carotid upstroke brisk and symmetric, no bruits, no thyromegaly or adenopathy LUNGS:  Clear to auscultation bilaterally CHEST:  Unremarkable HEART:  RRR,  PMI not displaced or sustained,S1 and S2 within normal limits, no S3, no S4: no clicks, no rubs, gr 2/6 SEM at apex ABD:  Soft, nontender. BS +, no masses or bruits. No hepatomegaly, no splenomegaly. Bruising below umbilicus EXT:  2 + pulses throughout, no edema, no cyanosis no clubbing SKIN:  Warm and dry.  No rashes NEURO:  Alert and oriented x 3. Cranial nerves II through XII intact. PSYCH:  Cognitively intact   Wt Readings from Last 3 Encounters:  01/01/22 196 lb 12.8 oz (89.3 kg)  12/31/21 196 lb (88.9 kg)  11/28/21 195 lb (88.5 kg)      Studies/Labs Reviewed:   EKG:  EKG is not ordered today.     Recent Labs: 11/28/2021: Magnesium 1.8 12/22/2021: ALT 7; BUN 17; Creatinine 1.30; Hemoglobin 11.3; Platelet Count 96; Potassium 4.9; Sodium 139   Lipid Panel    Component Value Date/Time   CHOL 157 05/11/2019 0946   TRIG 758 (H) 08/20/2021 0612   HDL 32 (L) 05/11/2019 0946   CHOLHDL 4.9 05/11/2019 0946   CHOLHDL 3.5 04/28/2010 0703   VLDL 14 04/28/2010 0703   LDLCALC 81 05/11/2019 0946   Dated 02/21/20: creatinine 1.38. potassium 3.5. Hgb 11.3.  Dated 02/03/21: cholesterol 147, triglycerides 268, HDL 29, LDL 64.   Additional studies/ records that were reviewed today include:   Echo 09/20/2015 LV EF: 60% -   65%   Study Conclusions   - Left ventricle: The cavity size was normal. There was moderate   focal basal hypertrophy of the septum.  Systolic function was   normal. The estimated ejection fraction was in the range of 60%   to 65%. Wall motion was normal; there were no regional wall   motion abnormalities. Doppler parameters are consistent with   abnormal left ventricular relaxation (grade 1 diastolic   dysfunction). - Aortic valve: There was moderate to severe regurgitation. - Aorta: Aortic root dimension: 43 mm (ED). - Ascending aorta: The ascending aorta was mildly dilated. - Right ventricle: The cavity size was mildly dilated. Wall   thickness was normal.       Cath 04/16/2016 Conclusion      Mid RCA lesion, 80 %stenosed. Ost LAD to Prox LAD lesion, 25 %stenosed. Dist LAD lesion, 50 %stenosed. There is mild left ventricular systolic dysfunction. LV end diastolic pressure is normal. The left ventricular ejection fraction is 45-50% by visual estimate. There is no mitral valve regurgitation. There is no aortic valve stenosis. LV end diastolic pressure is normal.   1. 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 Mild LV dysfunction. EF 45%. 3. Ascending thoracic aortic aneurysm. 4. 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 5. Normal Right heart  And LV filling pressures 6. Normal cardiac output.    Plan: Aortic root grafting and AVR.  Echo 09/16/2016 LV EF: 55% -   60%   Study Conclusions   - Left ventricle: The cavity size was normal. Wall thickness was   increased in a pattern of severe LVH. Systolic function was   normal. The estimated ejection fraction was in the range of 55%   to 60%. - Aortic valve: Valve area (VTI): 4.29 cm^2. Valve area (Vmax):   4.15 cm^2. Valve area (Vmean): 3.88 cm^2. - Mitral valve: Severely calcified annulus. Valve area by   continuity equation (using LVOT flow): 3.35 cm^2. - Pericardium, extracardiac: Localized moderate pericardial   effusion posterior to the LA.    Echo 05/19/19: IMPRESSIONS     1. Left ventricular ejection fraction, by  estimation, is 55 to 60%. The  left ventricle has normal function. The left ventricle has no regional  wall motion abnormalities. There is mild left ventricular hypertrophy of  the basal-septal segment. Left  ventricular diastolic parameters were normal.   2. Right ventricular systolic function is normal. The right ventricular  size is normal. There is normal pulmonary artery systolic pressure. The  estimated right ventricular systolic pressure is 81.2 mmHg.   3. Left atrial size was mildly dilated.   4. The mitral valve is normal in structure. Mild to moderate mitral valve  regurgitation. No evidence of mitral stenosis.   5. The aortic valve has been repaired/replaced. Aortic valve  regurgitation is not visualized. No aortic stenosis is present. 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 Procedure  Date: 2018. Echo findings are consistent  with normal structure and function of the aortic valve prosthesis. Aortic  valve mean gradient measures 10.8 mmHg. Aortic valve Vmax measures 2.12  m/s.   6. Aortic root/ascending aorta has been repaired/replaced.   7. The inferior vena cava is normal in size with greater than 50%  respiratory variability, suggesting right atrial pressure of 3 mmHg.   Comparison(s): Prior images unable to be directly viewed, comparison made  by report only.    CT ANGIOGRAPHY CHEST WITH CONTRAST   TECHNIQUE: Multidetector CT imaging of the chest was performed using the standard protocol during bolus administration of intravenous contrast. Multiplanar CT image reconstructions and MIPs were obtained to evaluate the vascular anatomy.   CONTRAST:  17m ISOVUE-370 IOPAMIDOL (ISOVUE-370) INJECTION 76%   Creatinine was obtained on site at [facility] [address]   Results: Creatinine 1.1 mg/dL.   COMPARISON:  11/17/2018   FINDINGS: Cardiovascular: Previous median sternotomy and aortic valve repair. The heart  size appears normal. Mild aortic atherosclerosis.   The aortic root measures 4.2 cm in diameter, image 86/10. Unchanged from previous exam.   The sino-tubular junction measures 3 cm in diameter, image 79/10. Unchanged.   The ascending thoracic aorta measures 3.9 cm, image 80/10. Unchanged from previous exam.   The transverse aortic arch measures 4.2 cm, image 131/12. Unchanged from previous exam   The descending thoracic aorta measures 2.9 cm, image 131/10. Stable.   No signs of aortic dissection. The great vessels appear widely patent.   The main pulmonary artery is patent. No obstructing pulmonary embolus identified.   Mediastinum/Nodes: Normal appearance of the thyroid gland. The trachea appears patent and is midline. Normal appearance of the esophagus. No supraclavicular, axillary, mediastinal, or hilar lymph nodes.   Lungs/Pleura: No pleural effusion. No airspace consolidation. Posterolateral left upper lobe scar is unchanged.   Upper Abdomen: No acute abnormality. Gallstones. Left upper pole renal calcification measures  1.1 cm.   Musculoskeletal: No acute findings.   Review of the MIP images confirms the above findings.   IMPRESSION: 1. 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 Recommend semi-annual imaging followup by CTA or MRA and referral to cardiothoracic surgery if not already obtained. This recommendation follows 2010 ACCF/AHA/AATS/ACR/ASA/SCA/SCAI/SIR/STS/SVM Guidelines for the Diagnosis and Management of Patients With Thoracic Aortic Disease. Circulation. 2010; 121: J941-D40. Aortic aneurysm NOS (ICD10-I71.9) 2. Status post aortic valve repair. 3. Gallstones. 4. Nonobstructing left renal calculus. 5. Aortic atherosclerosis.   Aortic Atherosclerosis (ICD10-I70.0).     Electronically Signed   By: Kerby Moors M.D.   On: 11/30/2019 10:02   Myoview 09/06/20: Study Highlights    The left  ventricular ejection fraction is moderately decreased (30-44%). Nuclear stress EF: 42%. There was no ST segment deviation noted during stress. Defect 1: There is a medium defect of moderate severity present in the basal inferior, mid inferior and apex location. Defect 2: There is a small defect of moderate severity present in the apical inferior and apical lateral location. Findings consistent with ischemia. This is an intermediate risk study.   Abnormal study. TID is 1.21, cannot exclude balanced ischemia. There is a fixed defect in the inferior wall and apex that is moderately severe, with mild-moderate hypokinesis in this area. There is a separate small reversible defect of moderate severity in the apical and inferior regions suggestive of ischemia. EF is moderately decreased, though would consider echo for definitive evaluation of function and wall motion.    Echo 09/16/20: IMPRESSIONS     1. Left ventricular ejection fraction, by estimation, is 65 to 70%. The  left ventricle has normal function. The left ventricle has no regional  wall motion abnormalities. There is mild left ventricular hypertrophy.  Left ventricular diastolic parameters  were normal.   2. Right ventricular systolic function is normal. The right ventricular  size is normal.   3. Mild mitral valve regurgitation.   4. 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 No  significant change from report of April 2021. . The aortic valve has been  repaired/replaced. Aortic valve  regurgitation is not visualized.  ASSESSMENT:    1. S/P AVR (aortic valve replacement)   2. Coronary artery disease of native artery of native heart with stable angina pectoris ([place])   3. Essential hypertension   4. Abdominal aortic aneurysm (AAA) without rupture, unspecified part ([place])   5. Chronic ITP (idiopathic thrombocytopenia) (HCC)   6. Aneurysm of  ascending aorta without rupture (HCC)         PLAN:  In order of problems listed above:  H/o Aortic root repair and AVR with a tissue valve.:   Repeat Echo in August 2022 showed good prosthetic valve function. Repeat CT in May showed stable 4.7 cm aneurysm.    CAD: asymptomatic.  80% mid nondominant RCA and a 50% distal LAD lesion on previous cardiac catheterization in 2018.  Abnormal myoview with some inferior and apical ischemia. This was an intermediate risk study. Ordinarily would consider repeat coronary angiography but his bleeding risk is high with ITP and recurrent major lower GI bleeds. He is a poor candidate for PCI because of this. I would not recommend invasive evaluation unless he were to have progressive angina despite medical therapy. Currently he has no significant angina.   Hypertension: Blood pressure is under good  control. On atenolol and amlodipine.   Hyperlipidemia: LDL is at goal  6.   ITP followed by Dr [name].   7.   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 Followed by [facility] Dr Donzetta Matters. Now s/p endovascular repair on 11/28/21. CT on 12/26/21 showed no endoleak.   9.  S/p lap choly and umbilical hernia repair.   10. Recurrent GI bleed- diverticular. S/p coil embolization.  Follow up in 6 months  Medication Adjustments/Labs and Tests Ordered: Current medicines are reviewed at length with the patient today.  Concerns regarding medicines are outlined above.  Medication changes, Labs and Tests ordered today are listed in the Patient Instructions below. There are no Patient Instructions on file for this visit.    Signed, Letrell Attwood Martin[place], MD  01/01/2022 2:37 PM    [facility] [place], [place], [place]  [address] Phone: [phone]; Fax: [phone]

## 2021-12-31 ENCOUNTER — Encounter: Payer: Self-pay | Admitting: Vascular Surgery

## 2021-12-31 ENCOUNTER — Ambulatory Visit (INDEPENDENT_AMBULATORY_CARE_PROVIDER_SITE_OTHER): Payer: Medicare Other | Admitting: Vascular Surgery

## 2021-12-31 VITALS — BP 143/79 | HR 57 | Temp 98.2°F | Resp 20 | Ht 69.0 in | Wt 196.0 lb

## 2021-12-31 DIAGNOSIS — I7143 Infrarenal abdominal aortic aneurysm, without rupture: Secondary | ICD-10-CM

## 2021-12-31 NOTE — Progress Notes (Signed)
     Subjective:     Patient ID: Lonnie Hansen, male   DOB: 20-Jan-1951, 71 y.o.   MRN: [id number]  HPI 71 year old male status post AAA repair with EVAR and coiling of the right hypogastric.  He is done very well since surgery states that initially he had hematomas in his bones but these have resolved.  He does not have any complaints today.   Review of Systems Resolved hematomas    Objective:   Physical Exam Vitals:   12/31/21 1333  BP: (!) 143/79  Pulse: (!) 57  Resp: 20  Temp: 98.2 F (36.8 C)  SpO2: 97%   Awake alert oriented Nonlabored respirations Abdomen protuberant, soft without any palpable masses Bilateral groins are soft with palpable pulses      CT IMPRESSION: VASCULAR   1. 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 The excluded aneurysm is stable in size and there is no evidence of endoleak. 2. 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 3. 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  Assessment/plan     71 year old male status post abdominal aortic aneurysm repair with EVAR and coiling of his right hypogastric.  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  This is mostly thrombosed which was evident on his angiogram at the time of procedure although it is proximally patent.  I discussed the patient following up in short order with CT scan if there is persistent enlargement we can consider treatment by covering this likely with covered stent and could likely just stent from the takeoff of the hypo into the posterior branch although possibly this will go on to fully thrombose.  I discussed with the patient signs and symptoms of rupture and he demonstrates good understanding short of this I will see him back in 6 months with CT scan.     Elmyra Banwart  C. Donzetta Matters, MD [facility] Office: [phone] Pager: [phone]

## 2022-01-01 ENCOUNTER — Encounter: Payer: Self-pay | Admitting: Cardiology

## 2022-01-01 ENCOUNTER — Ambulatory Visit: Payer: Medicare Other | Attending: Cardiology | Admitting: Cardiology

## 2022-01-01 VITALS — BP 142/80 | HR 59 | Ht 68.0 in | Wt 196.8 lb

## 2022-01-01 DIAGNOSIS — Z952 Presence of prosthetic heart valve: Secondary | ICD-10-CM | POA: Insufficient documentation

## 2022-01-01 DIAGNOSIS — I714 Abdominal aortic aneurysm, without rupture, unspecified: Secondary | ICD-10-CM | POA: Diagnosis not present

## 2022-01-01 DIAGNOSIS — D693 Immune thrombocytopenic purpura: Secondary | ICD-10-CM | POA: Diagnosis not present

## 2022-01-01 DIAGNOSIS — I10 Essential (primary) hypertension: Secondary | ICD-10-CM | POA: Insufficient documentation

## 2022-01-01 DIAGNOSIS — I7121 Aneurysm of the ascending aorta, without rupture: Secondary | ICD-10-CM | POA: Diagnosis not present

## 2022-01-01 DIAGNOSIS — I25118 Atherosclerotic heart disease of native coronary artery with other forms of angina pectoris: Secondary | ICD-10-CM | POA: Insufficient documentation

## 2022-01-02 ENCOUNTER — Other Ambulatory Visit: Payer: Self-pay

## 2022-01-02 ENCOUNTER — Inpatient Hospital Stay: Payer: Medicare Other

## 2022-01-02 ENCOUNTER — Inpatient Hospital Stay: Payer: Medicare Other | Attending: Hematology & Oncology

## 2022-01-02 ENCOUNTER — Encounter: Payer: Self-pay | Admitting: Family

## 2022-01-02 ENCOUNTER — Inpatient Hospital Stay (HOSPITAL_BASED_OUTPATIENT_CLINIC_OR_DEPARTMENT_OTHER): Payer: Medicare Other | Admitting: Family

## 2022-01-02 VITALS — BP 156/83 | HR 54 | Temp 98.0°F | Resp 19 | Ht 68.0 in | Wt 196.0 lb

## 2022-01-02 DIAGNOSIS — I714 Abdominal aortic aneurysm, without rupture, unspecified: Secondary | ICD-10-CM | POA: Insufficient documentation

## 2022-01-02 DIAGNOSIS — D693 Immune thrombocytopenic purpura: Secondary | ICD-10-CM | POA: Diagnosis not present

## 2022-01-02 DIAGNOSIS — D509 Iron deficiency anemia, unspecified: Secondary | ICD-10-CM

## 2022-01-02 DIAGNOSIS — Z79899 Other long term (current) drug therapy: Secondary | ICD-10-CM | POA: Diagnosis not present

## 2022-01-02 DIAGNOSIS — Z8679 Personal history of other diseases of the circulatory system: Secondary | ICD-10-CM | POA: Diagnosis not present

## 2022-01-02 LAB — CMP (CANCER CENTER ONLY)
ALT: 7 U/L (ref 0–44)
AST: 17 U/L (ref 15–41)
Albumin: 4.9 g/dL (ref 3.5–5.0)
Alkaline Phosphatase: 49 U/L (ref 38–126)
Anion gap: 8 (ref 5–15)
BUN: 18 mg/dL (ref 8–23)
CO2: 30 mmol/L (ref 22–32)
Calcium: 10.3 mg/dL (ref 8.9–10.3)
Chloride: 101 mmol/L (ref 98–111)
Creatinine: 1.43 mg/dL — ABNORMAL HIGH (ref 0.61–1.24)
GFR, Estimated: 52 mL/min — ABNORMAL LOW (ref >60)
Glucose, Bld: 110 mg/dL — ABNORMAL HIGH (ref 70–99)
Potassium: 4 mmol/L (ref 3.5–5.1)
Sodium: 139 mmol/L (ref 135–145)
Total Bilirubin: 0.7 mg/dL (ref 0.3–1.2)
Total Protein: 7.9 g/dL (ref 6.5–8.1)

## 2022-01-02 LAB — CBC WITH DIFFERENTIAL (CANCER CENTER ONLY)
Abs Immature Granulocytes: 0.03 10*3/uL (ref 0.00–0.07)
Basophils Absolute: 0 10*3/uL (ref 0.0–0.1)
Basophils Relative: 0 %
Eosinophils Absolute: 0.2 10*3/uL (ref 0.0–0.5)
Eosinophils Relative: 3 %
HCT: 35.4 % — ABNORMAL LOW (ref 39.0–52.0)
Hemoglobin: 11.4 g/dL — ABNORMAL LOW (ref 13.0–17.0)
Immature Granulocytes: 0 %
Lymphocytes Relative: 16 %
Lymphs Abs: 1.1 10*3/uL (ref 0.7–4.0)
MCH: 30 pg (ref 26.0–34.0)
MCHC: 32.2 g/dL (ref 30.0–36.0)
MCV: 93.2 fL (ref 80.0–100.0)
Monocytes Absolute: 1 10*3/uL (ref 0.1–1.0)
Monocytes Relative: 14 %
Neutro Abs: 4.6 10*3/uL (ref 1.7–7.7)
Neutrophils Relative %: 67 %
Platelet Count: 142 10*3/uL — ABNORMAL LOW (ref 150–400)
RBC: 3.8 MIL/uL — ABNORMAL LOW (ref 4.22–5.81)
RDW: 13.7 % (ref 11.5–15.5)
WBC Count: 7 10*3/uL (ref 4.0–10.5)
nRBC: 0 % (ref 0.0–0.2)

## 2022-01-02 LAB — LACTATE DEHYDROGENASE: LDH: 272 U/L — ABNORMAL HIGH (ref 98–192)

## 2022-01-02 NOTE — Progress Notes (Signed)
Hematology and Oncology Follow Up Visit  JOSEF TOURIGNY [id number] December 20, 1950 71 y.o. 01/02/2022   Principle Diagnosis:  Chronic ITP   Current Therapy:        Nplate to keep platelet count over 100,000   Interim History:  Mr. Kopka is here today for follow-up and injection. He is doing well.  He had endovascular repair of AAA with Dr. Donzetta Matters on 11/28/2021 without any issues.  Platelet count today is up to 142, Hgb 11.4, MCV 93 and WBC count 7.0.  He has not noted any blood loss. No bruising or petechiae.  No fever, chills, n/v, cough, rash, dizziness, SOB, chest pain, palpitations, abdominal pain or changes in bowel or bladder habits.  No swelling, tenderness, numbness or tingling in his extremities at this time.  No falls or syncope. He ambulates with his cane for added support.  Appetite and hydration are good. Weight is stable at 196 lbs.   ECOG Performance Status: 1 - Symptomatic but completely ambulatory  Medications:  Allergies as of 01/02/2022   No Known Allergies      Medication List        Accurate as of January 02, 2022 10:42 AM. If you have any questions, ask your nurse or doctor.          acetaminophen 500 MG tablet Commonly known as: TYLENOL Take 1 tablet (500 mg total) by mouth every 8 (eight) hours as needed for fever (pain).   amLODipine 10 MG tablet Commonly known as: NORVASC Take 10 mg by mouth daily.   amoxicillin-clavulanate 875-125 MG tablet Commonly known as: AUGMENTIN Take 1 tablet by mouth 2 (two) times daily.   aspirin 81 MG chewable tablet Chew 1 tablet (81 mg total) by mouth daily.   atenolol 25 MG tablet Commonly known as: TENORMIN Take 12.5 mg by mouth 2 (two) times daily.   benzonatate 100 MG capsule Commonly known as: TESSALON Take 100 mg by mouth 3 (three) times daily.   fluticasone 50 MCG/ACT nasal spray Commonly known as: FLONASE Place 2 sprays into both nostrils daily as needed for allergies or rhinitis.   latanoprost  0.005 % ophthalmic solution Commonly known as: XALATAN Place 1 drop into both eyes at bedtime.   multivitamin with minerals Tabs tablet Take 1 tablet by mouth daily.   omega-3 acid ethyl esters 1 g capsule Commonly known as: LOVAZA Take 2 g by mouth 2 (two) times daily.   pantoprazole 40 MG tablet Commonly known as: PROTONIX Take 40 mg by mouth 2 (two) times daily.   polyethylene glycol 17 g packet Commonly known as: MIRALAX / GLYCOLAX Take 17 g by mouth daily as needed for mild constipation. Every other day What changed:  when to take this additional instructions   psyllium 95 % Pack Commonly known as: HYDROCIL/METAMUCIL Take 1 packet by mouth daily as needed for mild constipation.   rosuvastatin 20 MG tablet Commonly known as: CRESTOR Take 20 mg by mouth daily.   triamcinolone 0.025 % cream Commonly known as: KENALOG Apply 1 Application topically daily as needed (skin irritation/rash.).        Allergies: No Known Allergies  Past Medical History, Surgical history, Social history, and Family History were reviewed and updated.  Review of Systems: All other 10 point review of systems is negative.   Physical Exam:  vitals were not taken for this visit.   Wt Readings from Last 3 Encounters:  01/01/22 196 lb 12.8 oz (89.3 kg)  12/31/21 196 lb (88.9  kg)  11/28/21 195 lb (88.5 kg)    Ocular: Sclerae unicteric, pupils equal, round and reactive to light Ear-nose-throat: Oropharynx clear, dentition fair Lymphatic: No cervical or supraclavicular adenopathy Lungs no rales or rhonchi, good excursion bilaterally Heart regular rate and rhythm, no murmur appreciated Abd soft, nontender, positive bowel sounds MSK no focal spinal tenderness, no joint edema Neuro: non-focal, well-oriented, appropriate affect Breasts: Deferred   Lab Results  Component Value Date   WBC 6.9 12/22/2021   HGB 11.3 (L) 12/22/2021   HCT 35.0 (L) 12/22/2021   MCV 93.8 12/22/2021   PLT 96  (L) 12/22/2021   Lab Results  Component Value Date   FERRITIN 28 10/31/2021   IRON 91 10/31/2021   TIBC 419 10/31/2021   UIBC 328 10/31/2021   IRONPCTSAT 22 10/31/2021   Lab Results  Component Value Date   RETICCTPCT 2.1 10/31/2021   RBC 3.73 (L) 12/22/2021   No results found for: "KPAFRELGTCHN", "LAMBDASER", "KAPLAMBRATIO" No results found for: "IGGSERUM", "IGA", "IGMSERUM" No results found for: "TOTALPROTELP", "ALBUMINELP", "A1GS", "A2GS", "BETS", "BETA2SER", "GAMS", "MSPIKE", "SPEI"   Chemistry      Component Value Date/Time   NA 139 12/22/2021 1124   NA 140 05/11/2019 0946   K 4.9 12/22/2021 1124   CL 102 12/22/2021 1124   CO2 31 12/22/2021 1124   BUN 17 12/22/2021 1124   BUN 17 05/11/2019 0946   CREATININE 1.30 (H) 12/22/2021 1124   CREATININE 0.75 04/10/2016 1132      Component Value Date/Time   CALCIUM 10.1 12/22/2021 1124   ALKPHOS 48 12/22/2021 1124   AST 18 12/22/2021 1124   ALT 7 12/22/2021 1124   BILITOT 0.9 12/22/2021 1124       Impression and Plan: Mr. Kunst is a very pleasant 71 yo African American gentleman with chronic ITP.   Lab check and injection every 2 weeks.  Follow-up in 6 weeks.   Lottie Dawson, NP 12/8/202310:42 AM

## 2022-01-07 ENCOUNTER — Other Ambulatory Visit: Payer: Self-pay

## 2022-01-16 ENCOUNTER — Inpatient Hospital Stay: Payer: Medicare Other

## 2022-01-16 VITALS — BP 143/81 | HR 57 | Temp 98.9°F | Resp 19 | Wt 217.2 lb

## 2022-01-16 DIAGNOSIS — D693 Immune thrombocytopenic purpura: Secondary | ICD-10-CM

## 2022-01-16 DIAGNOSIS — Z79899 Other long term (current) drug therapy: Secondary | ICD-10-CM | POA: Diagnosis not present

## 2022-01-16 DIAGNOSIS — I714 Abdominal aortic aneurysm, without rupture, unspecified: Secondary | ICD-10-CM | POA: Diagnosis not present

## 2022-01-16 DIAGNOSIS — D509 Iron deficiency anemia, unspecified: Secondary | ICD-10-CM

## 2022-01-16 DIAGNOSIS — Z8679 Personal history of other diseases of the circulatory system: Secondary | ICD-10-CM | POA: Diagnosis not present

## 2022-01-16 LAB — CMP (CANCER CENTER ONLY)
ALT: 9 U/L (ref 0–44)
AST: 21 U/L (ref 15–41)
Albumin: 4.9 g/dL (ref 3.5–5.0)
Alkaline Phosphatase: 47 U/L (ref 38–126)
Anion gap: 10 (ref 5–15)
BUN: 18 mg/dL (ref 8–23)
CO2: 29 mmol/L (ref 22–32)
Calcium: 9.6 mg/dL (ref 8.9–10.3)
Chloride: 103 mmol/L (ref 98–111)
Creatinine: 1.35 mg/dL — ABNORMAL HIGH (ref 0.61–1.24)
GFR, Estimated: 56 mL/min — ABNORMAL LOW (ref >60)
Glucose, Bld: 115 mg/dL — ABNORMAL HIGH (ref 70–99)
Potassium: 4.1 mmol/L (ref 3.5–5.1)
Sodium: 142 mmol/L (ref 135–145)
Total Bilirubin: 0.9 mg/dL (ref 0.3–1.2)
Total Protein: 7.7 g/dL (ref 6.5–8.1)

## 2022-01-16 LAB — CBC WITH DIFFERENTIAL (CANCER CENTER ONLY)
Abs Immature Granulocytes: 0.03 10*3/uL (ref 0.00–0.07)
Basophils Absolute: 0 10*3/uL (ref 0.0–0.1)
Basophils Relative: 0 %
Eosinophils Absolute: 0.2 10*3/uL (ref 0.0–0.5)
Eosinophils Relative: 2 %
HCT: 35 % — ABNORMAL LOW (ref 39.0–52.0)
Hemoglobin: 11.3 g/dL — ABNORMAL LOW (ref 13.0–17.0)
Immature Granulocytes: 0 %
Lymphocytes Relative: 13 %
Lymphs Abs: 1.2 10*3/uL (ref 0.7–4.0)
MCH: 30 pg (ref 26.0–34.0)
MCHC: 32.3 g/dL (ref 30.0–36.0)
MCV: 92.8 fL (ref 80.0–100.0)
Monocytes Absolute: 0.9 10*3/uL (ref 0.1–1.0)
Monocytes Relative: 10 %
Neutro Abs: 6.7 10*3/uL (ref 1.7–7.7)
Neutrophils Relative %: 75 %
Platelet Count: 68 10*3/uL — ABNORMAL LOW (ref 150–400)
RBC: 3.77 MIL/uL — ABNORMAL LOW (ref 4.22–5.81)
RDW: 13.7 % (ref 11.5–15.5)
WBC Count: 9 10*3/uL (ref 4.0–10.5)
nRBC: 0 % (ref 0.0–0.2)

## 2022-01-16 LAB — LACTATE DEHYDROGENASE: LDH: 280 U/L — ABNORMAL HIGH (ref 98–192)

## 2022-01-16 MED ORDER — ROMIPLOSTIM 125 MCG ~~LOC~~ SOLR: 1.0000 ug/kg | Freq: Once | SUBCUTANEOUS | Status: DC

## 2022-01-16 MED ORDER — ROMIPLOSTIM 125 MCG ~~LOC~~ SOLR
0.9500 ug/kg | Freq: Once | SUBCUTANEOUS | Status: AC
  Administered 2022-01-16: 95 ug via SUBCUTANEOUS
  Filled 2022-01-16: qty 0.19

## 2022-01-16 NOTE — Patient Instructions (Signed)
Romiplostim Injection What is this medication? ROMIPLOSTIM (roe mi PLOE stim) treats low levels of platelets in your body caused by immune thrombocytopenia (ITP). It is prescribed when other medications have not worked or cannot be tolerated. It may also be used to help people who have been exposed to high doses of radiation. It works by increasing the amount of platelets in your blood. This lowers the risk of bleeding. This medicine may be used for other purposes; ask your health care provider or pharmacist if you have questions. COMMON BRAND NAME(S): Nplate What should I tell my care team before I take this medication? They need to know if you have any of these conditions: Blood clots Myelodysplastic syndrome An unusual or allergic reaction to romiplostim, mannitol, other medications, foods, dyes, or preservatives Pregnant or trying to get pregnant Breast-feeding How should I use this medication? This medication is injected under the skin. It is given by a care team in a hospital or clinic setting. A special MedGuide will be given to you before each treatment. Be sure to read this information carefully each time. Talk to your care team about the use of this medication in children. While it may be prescribed for children as young as newborns for selected conditions, precautions do apply. Overdosage: If you think you have taken too much of this medicine contact a poison control center or emergency room at once. NOTE: This medicine is only for you. Do not share this medicine with others. What if I miss a dose? Keep appointments for follow-up doses. It is important not to miss your dose. Call your care team if you are unable to keep an appointment. What may interact with this medication? Interactions are not expected. This list may not describe all possible interactions. Give your health care provider a list of all the medicines, herbs, non-prescription drugs, or dietary supplements you use. Also  tell them if you smoke, drink alcohol, or use illegal drugs. Some items may interact with your medicine. What should I watch for while using this medication? Visit your care team for regular checks on your progress. You may need blood work done while you are taking this medication. Your condition will be monitored carefully while you are receiving this medication. It is important not to miss any appointments. What side effects may I notice from receiving this medication? Side effects that you should report to your care team as soon as possible: Allergic reactions--skin rash, itching, hives, swelling of the face, lips, tongue, or throat Blood clot--pain, swelling, or warmth in the leg, shortness of breath, chest pain Side effects that usually do not require medical attention (report to your care team if they continue or are bothersome): Dizziness Joint pain Muscle pain Pain in the hands or feet Stomach pain Trouble sleeping This list may not describe all possible side effects. Call your doctor for medical advice about side effects. You may report side effects to FDA at 1-800-FDA-1088. Where should I keep my medication? This medication is given in a hospital or clinic. It will not be stored at home. NOTE: This sheet is a summary. It may not cover all possible information. If you have questions about this medicine, talk to your doctor, pharmacist, or health care provider.  2023 Elsevier/Gold Standard (2021-04-22 00:00:00)  

## 2022-01-30 ENCOUNTER — Inpatient Hospital Stay: Payer: Medicare Other | Attending: Hematology & Oncology

## 2022-01-30 ENCOUNTER — Inpatient Hospital Stay: Payer: Medicare Other

## 2022-01-30 VITALS — BP 148/87 | HR 66 | Temp 98.4°F | Resp 18

## 2022-01-30 DIAGNOSIS — Z7982 Long term (current) use of aspirin: Secondary | ICD-10-CM | POA: Insufficient documentation

## 2022-01-30 DIAGNOSIS — D693 Immune thrombocytopenic purpura: Secondary | ICD-10-CM

## 2022-01-30 DIAGNOSIS — D509 Iron deficiency anemia, unspecified: Secondary | ICD-10-CM

## 2022-01-30 DIAGNOSIS — Z87440 Personal history of urinary (tract) infections: Secondary | ICD-10-CM | POA: Diagnosis not present

## 2022-01-30 DIAGNOSIS — Z79899 Other long term (current) drug therapy: Secondary | ICD-10-CM | POA: Diagnosis not present

## 2022-01-30 LAB — CMP (CANCER CENTER ONLY)
ALT: 8 U/L (ref 0–44)
AST: 20 U/L (ref 15–41)
Albumin: 4.8 g/dL (ref 3.5–5.0)
Alkaline Phosphatase: 46 U/L (ref 38–126)
Anion gap: 9 (ref 5–15)
BUN: 14 mg/dL (ref 8–23)
CO2: 30 mmol/L (ref 22–32)
Calcium: 9.5 mg/dL (ref 8.9–10.3)
Chloride: 101 mmol/L (ref 98–111)
Creatinine: 1.31 mg/dL — ABNORMAL HIGH (ref 0.61–1.24)
GFR, Estimated: 58 mL/min — ABNORMAL LOW (ref >60)
Glucose, Bld: 117 mg/dL — ABNORMAL HIGH (ref 70–99)
Potassium: 3.9 mmol/L (ref 3.5–5.1)
Sodium: 140 mmol/L (ref 135–145)
Total Bilirubin: 0.9 mg/dL (ref 0.3–1.2)
Total Protein: 8 g/dL (ref 6.5–8.1)

## 2022-01-30 LAB — CBC WITH DIFFERENTIAL (CANCER CENTER ONLY)
Abs Immature Granulocytes: 0.03 10*3/uL (ref 0.00–0.07)
Basophils Absolute: 0 10*3/uL (ref 0.0–0.1)
Basophils Relative: 0 %
Eosinophils Absolute: 0.2 10*3/uL (ref 0.0–0.5)
Eosinophils Relative: 3 %
HCT: 35.5 % — ABNORMAL LOW (ref 39.0–52.0)
Hemoglobin: 11.5 g/dL — ABNORMAL LOW (ref 13.0–17.0)
Immature Granulocytes: 0 %
Lymphocytes Relative: 17 %
Lymphs Abs: 1.2 10*3/uL (ref 0.7–4.0)
MCH: 29.9 pg (ref 26.0–34.0)
MCHC: 32.4 g/dL (ref 30.0–36.0)
MCV: 92.4 fL (ref 80.0–100.0)
Monocytes Absolute: 0.8 10*3/uL (ref 0.1–1.0)
Monocytes Relative: 12 %
Neutro Abs: 4.8 10*3/uL (ref 1.7–7.7)
Neutrophils Relative %: 68 %
Platelet Count: 78 10*3/uL — ABNORMAL LOW (ref 150–400)
RBC: 3.84 MIL/uL — ABNORMAL LOW (ref 4.22–5.81)
RDW: 13.9 % (ref 11.5–15.5)
WBC Count: 7 10*3/uL (ref 4.0–10.5)
nRBC: 0 % (ref 0.0–0.2)

## 2022-01-30 MED ORDER — ROMIPLOSTIM 125 MCG ~~LOC~~ SOLR
95.0000 ug | Freq: Once | SUBCUTANEOUS | Status: AC
  Administered 2022-01-30: 95 ug via SUBCUTANEOUS
  Filled 2022-01-30: qty 0.19

## 2022-01-30 NOTE — Patient Instructions (Signed)
Romiplostim Injection What is this medication? ROMIPLOSTIM (roe mi PLOE stim) treats low levels of platelets in your body caused by immune thrombocytopenia (ITP). It is prescribed when other medications have not worked or cannot be tolerated. It may also be used to help people who have been exposed to high doses of radiation. It works by increasing the amount of platelets in your blood. This lowers the risk of bleeding. This medicine may be used for other purposes; ask your health care provider or pharmacist if you have questions. COMMON BRAND NAME(S): Nplate What should I tell my care team before I take this medication? They need to know if you have any of these conditions: Blood clots Myelodysplastic syndrome An unusual or allergic reaction to romiplostim, mannitol, other medications, foods, dyes, or preservatives Pregnant or trying to get pregnant Breast-feeding How should I use this medication? This medication is injected under the skin. It is given by a care team in a hospital or clinic setting. A special MedGuide will be given to you before each treatment. Be sure to read this information carefully each time. Talk to your care team about the use of this medication in children. While it may be prescribed for children as young as newborns for selected conditions, precautions do apply. Overdosage: If you think you have taken too much of this medicine contact a poison control center or emergency room at once. NOTE: This medicine is only for you. Do not share this medicine with others. What if I miss a dose? Keep appointments for follow-up doses. It is important not to miss your dose. Call your care team if you are unable to keep an appointment. What may interact with this medication? Interactions are not expected. This list may not describe all possible interactions. Give your health care provider a list of all the medicines, herbs, non-prescription drugs, or dietary supplements you use. Also  tell them if you smoke, drink alcohol, or use illegal drugs. Some items may interact with your medicine. What should I watch for while using this medication? Visit your care team for regular checks on your progress. You may need blood work done while you are taking this medication. Your condition will be monitored carefully while you are receiving this medication. It is important not to miss any appointments. What side effects may I notice from receiving this medication? Side effects that you should report to your care team as soon as possible: Allergic reactions--skin rash, itching, hives, swelling of the face, lips, tongue, or throat Blood clot--pain, swelling, or warmth in the leg, shortness of breath, chest pain Side effects that usually do not require medical attention (report to your care team if they continue or are bothersome): Dizziness Joint pain Muscle pain Pain in the hands or feet Stomach pain Trouble sleeping This list may not describe all possible side effects. Call your doctor for medical advice about side effects. You may report side effects to FDA at 1-800-FDA-1088. Where should I keep my medication? This medication is given in a hospital or clinic. It will not be stored at home. NOTE: This sheet is a summary. It may not cover all possible information. If you have questions about this medicine, talk to your doctor, pharmacist, or health care provider.  2023 Elsevier/Gold Standard (2021-04-22 00:00:00)  

## 2022-02-04 DIAGNOSIS — N390 Urinary tract infection, site not specified: Secondary | ICD-10-CM | POA: Diagnosis not present

## 2022-02-11 DIAGNOSIS — M1611 Unilateral primary osteoarthritis, right hip: Secondary | ICD-10-CM | POA: Diagnosis not present

## 2022-02-11 DIAGNOSIS — N2889 Other specified disorders of kidney and ureter: Secondary | ICD-10-CM | POA: Diagnosis not present

## 2022-02-11 DIAGNOSIS — N3000 Acute cystitis without hematuria: Secondary | ICD-10-CM | POA: Diagnosis not present

## 2022-02-11 DIAGNOSIS — R3121 Asymptomatic microscopic hematuria: Secondary | ICD-10-CM | POA: Diagnosis not present

## 2022-02-11 DIAGNOSIS — K5730 Diverticulosis of large intestine without perforation or abscess without bleeding: Secondary | ICD-10-CM | POA: Diagnosis not present

## 2022-02-11 DIAGNOSIS — R1084 Generalized abdominal pain: Secondary | ICD-10-CM | POA: Diagnosis not present

## 2022-02-11 DIAGNOSIS — N281 Cyst of kidney, acquired: Secondary | ICD-10-CM | POA: Diagnosis not present

## 2022-02-11 DIAGNOSIS — R8271 Bacteriuria: Secondary | ICD-10-CM | POA: Diagnosis not present

## 2022-02-13 ENCOUNTER — Inpatient Hospital Stay: Payer: Medicare Other

## 2022-02-13 ENCOUNTER — Inpatient Hospital Stay: Payer: Medicare Other | Admitting: Hematology & Oncology

## 2022-02-13 ENCOUNTER — Inpatient Hospital Stay (HOSPITAL_BASED_OUTPATIENT_CLINIC_OR_DEPARTMENT_OTHER): Payer: Medicare Other | Admitting: Hematology & Oncology

## 2022-02-13 ENCOUNTER — Encounter: Payer: Self-pay | Admitting: Hematology & Oncology

## 2022-02-13 VITALS — BP 141/77 | HR 64 | Temp 99.3°F | Resp 20 | Wt 183.0 lb

## 2022-02-13 DIAGNOSIS — Z79899 Other long term (current) drug therapy: Secondary | ICD-10-CM | POA: Diagnosis not present

## 2022-02-13 DIAGNOSIS — Z87440 Personal history of urinary (tract) infections: Secondary | ICD-10-CM | POA: Diagnosis not present

## 2022-02-13 DIAGNOSIS — D693 Immune thrombocytopenic purpura: Secondary | ICD-10-CM | POA: Diagnosis not present

## 2022-02-13 DIAGNOSIS — Z7982 Long term (current) use of aspirin: Secondary | ICD-10-CM | POA: Diagnosis not present

## 2022-02-13 DIAGNOSIS — D509 Iron deficiency anemia, unspecified: Secondary | ICD-10-CM

## 2022-02-13 LAB — CBC WITH DIFFERENTIAL (CANCER CENTER ONLY)
Abs Immature Granulocytes: 0.05 10*3/uL (ref 0.00–0.07)
Basophils Absolute: 0 10*3/uL (ref 0.0–0.1)
Basophils Relative: 0 %
Eosinophils Absolute: 0.2 10*3/uL (ref 0.0–0.5)
Eosinophils Relative: 2 %
HCT: 32.6 % — ABNORMAL LOW (ref 39.0–52.0)
Hemoglobin: 10.5 g/dL — ABNORMAL LOW (ref 13.0–17.0)
Immature Granulocytes: 1 %
Lymphocytes Relative: 13 %
Lymphs Abs: 1 10*3/uL (ref 0.7–4.0)
MCH: 29.6 pg (ref 26.0–34.0)
MCHC: 32.2 g/dL (ref 30.0–36.0)
MCV: 91.8 fL (ref 80.0–100.0)
Monocytes Absolute: 0.8 10*3/uL (ref 0.1–1.0)
Monocytes Relative: 11 %
Neutro Abs: 5.4 10*3/uL (ref 1.7–7.7)
Neutrophils Relative %: 73 %
Platelet Count: 116 10*3/uL — ABNORMAL LOW (ref 150–400)
RBC: 3.55 MIL/uL — ABNORMAL LOW (ref 4.22–5.81)
RDW: 13.2 % (ref 11.5–15.5)
WBC Count: 7.5 10*3/uL (ref 4.0–10.5)
nRBC: 0 % (ref 0.0–0.2)

## 2022-02-13 LAB — CMP (CANCER CENTER ONLY)
ALT: 27 U/L (ref 0–44)
AST: 28 U/L (ref 15–41)
Albumin: 4.3 g/dL (ref 3.5–5.0)
Alkaline Phosphatase: 52 U/L (ref 38–126)
Anion gap: 10 (ref 5–15)
BUN: 17 mg/dL (ref 8–23)
CO2: 30 mmol/L (ref 22–32)
Calcium: 10 mg/dL (ref 8.9–10.3)
Chloride: 101 mmol/L (ref 98–111)
Creatinine: 1.28 mg/dL — ABNORMAL HIGH (ref 0.61–1.24)
GFR, Estimated: 59 mL/min — ABNORMAL LOW (ref >60)
Glucose, Bld: 117 mg/dL — ABNORMAL HIGH (ref 70–99)
Potassium: 3.8 mmol/L (ref 3.5–5.1)
Sodium: 141 mmol/L (ref 135–145)
Total Bilirubin: 0.5 mg/dL (ref 0.3–1.2)
Total Protein: 7.7 g/dL (ref 6.5–8.1)

## 2022-02-13 NOTE — Progress Notes (Signed)
Hematology and Oncology Follow Up Visit  Lonnie Hansen [id number] 04-27-1950 72 y.o. 02/13/2022   Principle Diagnosis:  Chronic ITP   Current Therapy:        Nplate to keep platelet count over 100,000   Interim History:  Lonnie Hansen is here today for follow-up.  He is doing okay.  He recently had a urinary tract infection.  He sees [facility].  He was treated by them.  He did have a cystoscopy.  This did not feel all that good.   Otherwise, he seems to be doing pretty well.  He has had no bleeding.  His arthritis is bothering him quite a bit.  I told him that he could take some aspirin if necessary.  I told him that he must take aspirin with food.  Aspirin seems to be the only thing that seemed to help his arthritis.  He has had no bleeding.  He has had no bruising.  His appetite has been pretty decent.  Currently, I would have said that his performance status is probably ECOG 1.    Medications:  Allergies as of 02/13/2022   No Known Allergies      Medication List        Accurate as of February 13, 2022 12:14 PM. If you have any questions, ask your nurse or doctor.          STOP taking these medications    amoxicillin-clavulanate 875-125 MG tablet Commonly known as: AUGMENTIN Stopped by: Volanda Napoleon, MD       TAKE these medications    acetaminophen 500 MG tablet Commonly known as: TYLENOL Take 1 tablet (500 mg total) by mouth every 8 (eight) hours as needed for fever (pain).   amLODipine 10 MG tablet Commonly known as: NORVASC Take 10 mg by mouth daily.   aspirin 81 MG chewable tablet Chew 1 tablet (81 mg total) by mouth daily.   atenolol 25 MG tablet Commonly known as: TENORMIN Take 12.5 mg by mouth 2 (two) times daily.   benzonatate 100 MG capsule Commonly known as: TESSALON Take 100 mg by mouth 3 (three) times daily.   fluticasone 50 MCG/ACT nasal spray Commonly known as: FLONASE Place 2 sprays into both nostrils daily as needed for  allergies or rhinitis.   latanoprost 0.005 % ophthalmic solution Commonly known as: XALATAN Place 1 drop into both eyes at bedtime.   levofloxacin 500 MG tablet Commonly known as: LEVAQUIN Take 500 mg by mouth daily.   multivitamin with minerals Tabs tablet Take 1 tablet by mouth daily.   nitrofurantoin (macrocrystal-monohydrate) 100 MG capsule Commonly known as: MACROBID Take 100 mg by mouth every 12 (twelve) hours.   omega-3 acid ethyl esters 1 g capsule Commonly known as: LOVAZA Take 2 g by mouth 2 (two) times daily.   pantoprazole 40 MG tablet Commonly known as: PROTONIX Take 40 mg by mouth 2 (two) times daily.   polyethylene glycol 17 g packet Commonly known as: MIRALAX / GLYCOLAX Take 17 g by mouth daily as needed for mild constipation. Every other day   psyllium 95 % Pack Commonly known as: HYDROCIL/METAMUCIL Take 1 packet by mouth daily as needed for mild constipation.   rosuvastatin 20 MG tablet Commonly known as: CRESTOR Take 20 mg by mouth daily.   tamsulosin 0.4 MG Caps capsule Commonly known as: FLOMAX Take 0.4 mg by mouth daily.   triamcinolone 0.025 % cream Commonly known as: KENALOG Apply 1 Application topically daily as needed (  skin irritation/rash.).        Allergies: No Known Allergies  Past Medical History, Surgical history, Social history, and Family History were reviewed and updated.  Review of Systems: Review of Systems  Constitutional: Negative.   HENT: Negative.    Eyes: Negative.   Respiratory: Negative.    Cardiovascular: Negative.   Gastrointestinal: Negative.   Genitourinary: Negative.   Musculoskeletal: Negative.   Skin: Negative.   Neurological: Negative.   Endo/Heme/Allergies: Negative.   Psychiatric/Behavioral: Negative.       Physical Exam:  weight is 183 lb (83 kg). His oral temperature is 99.3 F (37.4 C). His blood pressure is 141/77 (abnormal) and his pulse is 64. His respiration is 20 and oxygen saturation  is 100%.   Wt Readings from Last 3 Encounters:  02/13/22 183 lb (83 kg)  01/16/22 217 lb 2.5 oz (98.5 kg)  01/02/22 196 lb (88.9 kg)    Physical Exam Vitals reviewed.  HENT:     Head: Normocephalic and atraumatic.  Eyes:     Pupils: Pupils are equal, round, and reactive to light.  Cardiovascular:     Rate and Rhythm: Normal rate and regular rhythm.     Heart sounds: Normal heart sounds.  Pulmonary:     Effort: Pulmonary effort is normal.     Breath sounds: Normal breath sounds.  Abdominal:     General: Bowel sounds are normal.     Palpations: Abdomen is soft.  Musculoskeletal:        General: No tenderness or deformity. Normal range of motion.     Cervical back: Normal range of motion.  Lymphadenopathy:     Cervical: No cervical adenopathy.  Skin:    General: Skin is warm and dry.     Findings: No erythema or rash.  Neurological:     Mental Status: He is alert and oriented to person, place, and time.  Psychiatric:        Behavior: Behavior normal.        Thought Content: Thought content normal.        Judgment: Judgment normal.      Lab Results  Component Value Date   WBC 7.5 02/13/2022   HGB 10.5 (L) 02/13/2022   HCT 32.6 (L) 02/13/2022   MCV 91.8 02/13/2022   PLT 116 (L) 02/13/2022   Lab Results  Component Value Date   FERRITIN 28 10/31/2021   IRON 91 10/31/2021   TIBC 419 10/31/2021   UIBC 328 10/31/2021   IRONPCTSAT 22 10/31/2021   Lab Results  Component Value Date   RETICCTPCT 2.1 10/31/2021   RBC 3.55 (L) 02/13/2022   No results found for: "KPAFRELGTCHN", "LAMBDASER", "KAPLAMBRATIO" No results found for: "IGGSERUM", "IGA", "IGMSERUM" No results found for: "TOTALPROTELP", "ALBUMINELP", "A1GS", "A2GS", "BETS", "BETA2SER", "GAMS", "MSPIKE", "SPEI"   Chemistry      Component Value Date/Time   NA 140 01/30/2022 1134   NA 140 05/11/2019 0946   K 3.9 01/30/2022 1134   CL 101 01/30/2022 1134   CO2 30 01/30/2022 1134   BUN 14 01/30/2022 1134    BUN 17 05/11/2019 0946   CREATININE 1.31 (H) 01/30/2022 1134   CREATININE 0.75 04/10/2016 1132      Component Value Date/Time   CALCIUM 9.5 01/30/2022 1134   ALKPHOS 46 01/30/2022 1134   AST 20 01/30/2022 1134   ALT 8 01/30/2022 1134   BILITOT 0.9 01/30/2022 1134       Impression and Plan: Lonnie Hansen is a very pleasant  72 yo [place] American gentleman with chronic ITP.  His platelet count is doing well today.  He does not need any Nplate today.  Hopefully, we can try to move his appointments out a little bit further.  Maybe, we go every 3 weeks before he needs to come back.  Again, if he wants to take aspirin, I think this would be okay.  He just has to take aspirin with food.   Volanda Napoleon, MD 1/19/202412:14 PM

## 2022-02-18 DIAGNOSIS — N3000 Acute cystitis without hematuria: Secondary | ICD-10-CM | POA: Diagnosis not present

## 2022-02-18 DIAGNOSIS — R3121 Asymptomatic microscopic hematuria: Secondary | ICD-10-CM | POA: Diagnosis not present

## 2022-03-03 DIAGNOSIS — H401231 Low-tension glaucoma, bilateral, mild stage: Secondary | ICD-10-CM | POA: Diagnosis not present

## 2022-03-13 DIAGNOSIS — R5383 Other fatigue: Secondary | ICD-10-CM | POA: Diagnosis not present

## 2022-03-13 DIAGNOSIS — R0981 Nasal congestion: Secondary | ICD-10-CM | POA: Diagnosis not present

## 2022-03-13 DIAGNOSIS — J029 Acute pharyngitis, unspecified: Secondary | ICD-10-CM | POA: Diagnosis not present

## 2022-03-13 DIAGNOSIS — G4733 Obstructive sleep apnea (adult) (pediatric): Secondary | ICD-10-CM | POA: Diagnosis not present

## 2022-03-13 DIAGNOSIS — R051 Acute cough: Secondary | ICD-10-CM | POA: Diagnosis not present

## 2022-03-13 DIAGNOSIS — J018 Other acute sinusitis: Secondary | ICD-10-CM | POA: Diagnosis not present

## 2022-03-13 DIAGNOSIS — Z1152 Encounter for screening for COVID-19: Secondary | ICD-10-CM | POA: Diagnosis not present

## 2022-03-16 DIAGNOSIS — R7989 Other specified abnormal findings of blood chemistry: Secondary | ICD-10-CM | POA: Diagnosis not present

## 2022-03-16 DIAGNOSIS — Z1212 Encounter for screening for malignant neoplasm of rectum: Secondary | ICD-10-CM | POA: Diagnosis not present

## 2022-03-16 DIAGNOSIS — Z125 Encounter for screening for malignant neoplasm of prostate: Secondary | ICD-10-CM | POA: Diagnosis not present

## 2022-03-16 DIAGNOSIS — R002 Palpitations: Secondary | ICD-10-CM | POA: Diagnosis not present

## 2022-03-16 DIAGNOSIS — I10 Essential (primary) hypertension: Secondary | ICD-10-CM | POA: Diagnosis not present

## 2022-03-16 DIAGNOSIS — E785 Hyperlipidemia, unspecified: Secondary | ICD-10-CM | POA: Diagnosis not present

## 2022-03-23 DIAGNOSIS — I700 Atherosclerosis of aorta: Secondary | ICD-10-CM | POA: Diagnosis not present

## 2022-03-23 DIAGNOSIS — Z0000 Encounter for general adult medical examination without abnormal findings: Secondary | ICD-10-CM | POA: Diagnosis not present

## 2022-03-23 DIAGNOSIS — Z1331 Encounter for screening for depression: Secondary | ICD-10-CM | POA: Diagnosis not present

## 2022-03-23 DIAGNOSIS — G4733 Obstructive sleep apnea (adult) (pediatric): Secondary | ICD-10-CM | POA: Diagnosis not present

## 2022-03-23 DIAGNOSIS — E785 Hyperlipidemia, unspecified: Secondary | ICD-10-CM | POA: Diagnosis not present

## 2022-03-23 DIAGNOSIS — M25551 Pain in right hip: Secondary | ICD-10-CM | POA: Diagnosis not present

## 2022-03-23 DIAGNOSIS — I10 Essential (primary) hypertension: Secondary | ICD-10-CM | POA: Diagnosis not present

## 2022-03-23 DIAGNOSIS — H6123 Impacted cerumen, bilateral: Secondary | ICD-10-CM | POA: Diagnosis not present

## 2022-03-23 DIAGNOSIS — Z1339 Encounter for screening examination for other mental health and behavioral disorders: Secondary | ICD-10-CM | POA: Diagnosis not present

## 2022-03-23 DIAGNOSIS — I2510 Atherosclerotic heart disease of native coronary artery without angina pectoris: Secondary | ICD-10-CM | POA: Diagnosis not present

## 2022-03-23 DIAGNOSIS — D696 Thrombocytopenia, unspecified: Secondary | ICD-10-CM | POA: Diagnosis not present

## 2022-03-23 DIAGNOSIS — R82998 Other abnormal findings in urine: Secondary | ICD-10-CM | POA: Diagnosis not present

## 2022-03-27 ENCOUNTER — Encounter: Payer: Self-pay | Admitting: Hematology & Oncology

## 2022-03-27 ENCOUNTER — Other Ambulatory Visit: Payer: Self-pay

## 2022-03-27 ENCOUNTER — Inpatient Hospital Stay: Payer: Medicare Other

## 2022-03-27 ENCOUNTER — Inpatient Hospital Stay (HOSPITAL_BASED_OUTPATIENT_CLINIC_OR_DEPARTMENT_OTHER): Payer: Medicare Other | Admitting: Hematology & Oncology

## 2022-03-27 ENCOUNTER — Inpatient Hospital Stay: Payer: Medicare Other | Attending: Hematology & Oncology

## 2022-03-27 VITALS — BP 148/72 | HR 60 | Temp 98.6°F | Resp 16 | Ht 68.0 in | Wt 187.0 lb

## 2022-03-27 DIAGNOSIS — Z79899 Other long term (current) drug therapy: Secondary | ICD-10-CM | POA: Diagnosis not present

## 2022-03-27 DIAGNOSIS — M7989 Other specified soft tissue disorders: Secondary | ICD-10-CM | POA: Insufficient documentation

## 2022-03-27 DIAGNOSIS — D693 Immune thrombocytopenic purpura: Secondary | ICD-10-CM

## 2022-03-27 LAB — CBC WITH DIFFERENTIAL (CANCER CENTER ONLY)
Abs Immature Granulocytes: 0.02 10*3/uL (ref 0.00–0.07)
Basophils Absolute: 0 10*3/uL (ref 0.0–0.1)
Basophils Relative: 1 %
Eosinophils Absolute: 0.3 10*3/uL (ref 0.0–0.5)
Eosinophils Relative: 5 %
HCT: 33.8 % — ABNORMAL LOW (ref 39.0–52.0)
Hemoglobin: 11 g/dL — ABNORMAL LOW (ref 13.0–17.0)
Immature Granulocytes: 0 %
Lymphocytes Relative: 19 %
Lymphs Abs: 1.2 10*3/uL (ref 0.7–4.0)
MCH: 30.1 pg (ref 26.0–34.0)
MCHC: 32.5 g/dL (ref 30.0–36.0)
MCV: 92.6 fL (ref 80.0–100.0)
Monocytes Absolute: 0.6 10*3/uL (ref 0.1–1.0)
Monocytes Relative: 10 %
Neutro Abs: 4.1 10*3/uL (ref 1.7–7.7)
Neutrophils Relative %: 65 %
Platelet Count: 67 10*3/uL — ABNORMAL LOW (ref 150–400)
RBC: 3.65 MIL/uL — ABNORMAL LOW (ref 4.22–5.81)
RDW: 14.1 % (ref 11.5–15.5)
WBC Count: 6.3 10*3/uL (ref 4.0–10.5)
nRBC: 0 % (ref 0.0–0.2)

## 2022-03-27 LAB — CMP (CANCER CENTER ONLY)
ALT: 8 U/L (ref 0–44)
AST: 20 U/L (ref 15–41)
Albumin: 4.7 g/dL (ref 3.5–5.0)
Alkaline Phosphatase: 47 U/L (ref 38–126)
Anion gap: 8 (ref 5–15)
BUN: 13 mg/dL (ref 8–23)
CO2: 30 mmol/L (ref 22–32)
Calcium: 9.6 mg/dL (ref 8.9–10.3)
Chloride: 102 mmol/L (ref 98–111)
Creatinine: 1.33 mg/dL — ABNORMAL HIGH (ref 0.61–1.24)
GFR, Estimated: 57 mL/min — ABNORMAL LOW (ref >60)
Glucose, Bld: 108 mg/dL — ABNORMAL HIGH (ref 70–99)
Potassium: 3.8 mmol/L (ref 3.5–5.1)
Sodium: 140 mmol/L (ref 135–145)
Total Bilirubin: 0.7 mg/dL (ref 0.3–1.2)
Total Protein: 7.3 g/dL (ref 6.5–8.1)

## 2022-03-27 MED ORDER — ROMIPLOSTIM 125 MCG ~~LOC~~ SOLR
95.0000 ug | Freq: Once | SUBCUTANEOUS | Status: AC
  Administered 2022-03-27: 95 ug via SUBCUTANEOUS
  Filled 2022-03-27: qty 0.19

## 2022-03-27 NOTE — Progress Notes (Signed)
Hematology and Oncology Follow Up Visit  Lonnie Hansen [id number] 1950/12/29 72 y.o. 03/27/2022   Principle Diagnosis:  Chronic ITP   Current Therapy:        Nplate to keep platelet count over 100,000   Interim History:  Lonnie Hansen is here today for follow-up.  He is still deciding about his right hip surgery.  He is not sure when he wants to have this done.  I told him that all he has to do is just to give [place] an idea as to when he would like to have this done and then we can rearrange his Nplate injections.  Overall he is doing okay.  There is been no problems with bleeding.  He has been pretty active.  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  He has had no change in bowel or bladder habits.  He has had no cough or shortness of breath.  He has had no issues with nausea or vomiting.  He has little bit of leg swelling but this is chronic.  Overall, I would say that his performance status is probably ECOG 1.     Medications:  Allergies as of 03/27/2022   No Known Allergies      Medication List        Accurate as of March 27, 2022  2:42 PM. If you have any questions, ask your nurse or doctor.          acetaminophen 500 MG tablet Commonly known as: TYLENOL Take 1 tablet (500 mg total) by mouth every 8 (eight) hours as needed for fever (pain).   amLODipine 10 MG tablet Commonly known as: NORVASC Take 10 mg by mouth daily.   aspirin 81 MG chewable tablet Chew 1 tablet (81 mg total) by mouth daily.   atenolol 25 MG tablet Commonly known as: TENORMIN Take 12.5 mg by mouth 2 (two) times daily.   benzonatate 100 MG capsule Commonly known as: TESSALON Take 100 mg by mouth 3 (three) times daily.   fluticasone 50 MCG/ACT nasal spray Commonly known as: FLONASE Place 2 sprays into both nostrils daily as needed for allergies or rhinitis.   latanoprost 0.005 % ophthalmic solution Commonly known as: XALATAN Place 1 drop into both eyes at bedtime.    levofloxacin 500 MG tablet Commonly known as: LEVAQUIN Take 500 mg by mouth daily.   multivitamin with minerals Tabs tablet Take 1 tablet by mouth daily.   nitrofurantoin (macrocrystal-monohydrate) 100 MG capsule Commonly known as: MACROBID Take 100 mg by mouth every 12 (twelve) hours.   omega-3 acid ethyl esters 1 g capsule Commonly known as: LOVAZA Take 2 g by mouth 2 (two) times daily.   pantoprazole 40 MG tablet Commonly known as: PROTONIX Take 40 mg by mouth 2 (two) times daily.   polyethylene glycol 17 g packet Commonly known as: MIRALAX / GLYCOLAX Take 17 g by mouth daily as needed for mild constipation. Every other day   psyllium 95 % Pack Commonly known as: HYDROCIL/METAMUCIL Take 1 packet by mouth daily as needed for mild constipation.   rosuvastatin 20 MG tablet Commonly known as: CRESTOR Take 20 mg by mouth daily.   tamsulosin 0.4 MG Caps capsule Commonly known as: FLOMAX Take 0.4 mg by mouth daily.   triamcinolone 0.025 % cream Commonly known as: KENALOG Apply 1 Application topically daily as needed (skin irritation/rash.).        Allergies: No Known Allergies  Past Medical History, Surgical history,  Social history, and Family History were reviewed and updated.  Review of Systems: Review of Systems  Constitutional: Negative.   HENT: Negative.    Eyes: Negative.   Respiratory: Negative.    Cardiovascular: Negative.   Gastrointestinal: Negative.   Genitourinary: Negative.   Musculoskeletal: Negative.   Skin: Negative.   Neurological: Negative.   Endo/Heme/Allergies: Negative.   Psychiatric/Behavioral: Negative.       Physical Exam:  height is '5\' 8"'$  (1.727 m) and weight is 187 lb (84.8 kg). His oral temperature is 98.6 F (37 C). His blood pressure is 148/72 (abnormal) and his pulse is 60. His respiration is 16 and oxygen saturation is 100%.   Wt Readings from Last 3 Encounters:  03/27/22 187 lb (84.8 kg)  02/13/22 183 lb (83 kg)   01/16/22 217 lb 2.5 oz (98.5 kg)    Physical Exam Vitals reviewed.  HENT:     Head: Normocephalic and atraumatic.  Eyes:     Pupils: Pupils are equal, round, and reactive to light.  Cardiovascular:     Rate and Rhythm: Normal rate and regular rhythm.     Heart sounds: Normal heart sounds.  Pulmonary:     Effort: Pulmonary effort is normal.     Breath sounds: Normal breath sounds.  Abdominal:     General: Bowel sounds are normal.     Palpations: Abdomen is soft.  Musculoskeletal:        General: No tenderness or deformity. Normal range of motion.     Cervical back: Normal range of motion.  Lymphadenopathy:     Cervical: No cervical adenopathy.  Skin:    General: Skin is warm and dry.     Findings: No erythema or rash.  Neurological:     Mental Status: He is alert and oriented to person, place, and time.  Psychiatric:        Behavior: Behavior normal.        Thought Content: Thought content normal.        Judgment: Judgment normal.     Lab Results  Component Value Date   WBC 6.3 03/27/2022   HGB 11.0 (L) 03/27/2022   HCT 33.8 (L) 03/27/2022   MCV 92.6 03/27/2022   PLT 67 (L) 03/27/2022   Lab Results  Component Value Date   FERRITIN 28 10/31/2021   IRON 91 10/31/2021   TIBC 419 10/31/2021   UIBC 328 10/31/2021   IRONPCTSAT 22 10/31/2021   Lab Results  Component Value Date   RETICCTPCT 2.1 10/31/2021   RBC 3.65 (L) 03/27/2022   No results found for: "KPAFRELGTCHN", "LAMBDASER", "KAPLAMBRATIO" No results found for: "IGGSERUM", "IGA", "IGMSERUM" No results found for: "TOTALPROTELP", "ALBUMINELP", "A1GS", "A2GS", "BETS", "BETA2SER", "GAMS", "MSPIKE", "SPEI"   Chemistry      Component Value Date/Time   NA 140 03/27/2022 1316   NA 140 05/11/2019 0946   K 3.8 03/27/2022 1316   CL 102 03/27/2022 1316   CO2 30 03/27/2022 1316   BUN 13 03/27/2022 1316   BUN 17 05/11/2019 0946   CREATININE 1.33 (H) 03/27/2022 1316   CREATININE 0.75 04/10/2016 1132       Component Value Date/Time   CALCIUM 9.6 03/27/2022 1316   ALKPHOS 47 03/27/2022 1316   AST 20 03/27/2022 1316   ALT 8 03/27/2022 1316   BILITOT 0.7 03/27/2022 1316       Impression and Plan: Lonnie Hansen is a very pleasant 72 yo African American gentleman with chronic ITP.  We will go ahead  and do Nplate on him today.  Again, we really need to find out when he needs to want to have his hip surgery.  This will be his RIGHT hip surgery.  I think that we can probably see about getting him back every 4 weeks now.  His platelet count does drop but really not all that bad.   Volanda Napoleon, MD 3/1/20242:42 PM

## 2022-03-27 NOTE — Patient Instructions (Signed)
Romiplostim Injection What is this medication? ROMIPLOSTIM (roe mi PLOE stim) treats low levels of platelets in your body caused by immune thrombocytopenia (ITP). It is prescribed when other medications have not worked or cannot be tolerated. It may also be used to help people who have been exposed to high doses of radiation. It works by increasing the amount of platelets in your blood. This lowers the risk of bleeding. This medicine may be used for other purposes; ask your health care provider or pharmacist if you have questions. COMMON BRAND NAME(S): Nplate What should I tell my care team before I take this medication? They need to know if you have any of these conditions: Blood clots Myelodysplastic syndrome An unusual or allergic reaction to romiplostim, mannitol, other medications, foods, dyes, or preservatives Pregnant or trying to get pregnant Breast-feeding How should I use this medication? This medication is injected under the skin. It is given by a care team in a hospital or clinic setting. A special MedGuide will be given to you before each treatment. Be sure to read this information carefully each time. Talk to your care team about the use of this medication in children. While it may be prescribed for children as young as newborns for selected conditions, precautions do apply. Overdosage: If you think you have taken too much of this medicine contact a poison control center or emergency room at once. NOTE: This medicine is only for you. Do not share this medicine with others. What if I miss a dose? Keep appointments for follow-up doses. It is important not to miss your dose. Call your care team if you are unable to keep an appointment. What may interact with this medication? Interactions are not expected. This list may not describe all possible interactions. Give your health care provider a list of all the medicines, herbs, non-prescription drugs, or dietary supplements you use. Also  tell them if you smoke, drink alcohol, or use illegal drugs. Some items may interact with your medicine. What should I watch for while using this medication? Visit your care team for regular checks on your progress. You may need blood work done while you are taking this medication. Your condition will be monitored carefully while you are receiving this medication. It is important not to miss any appointments. What side effects may I notice from receiving this medication? Side effects that you should report to your care team as soon as possible: Allergic reactions--skin rash, itching, hives, swelling of the face, lips, tongue, or throat Blood clot--pain, swelling, or warmth in the leg, shortness of breath, chest pain Side effects that usually do not require medical attention (report to your care team if they continue or are bothersome): Dizziness Joint pain Muscle pain Pain in the hands or feet Stomach pain Trouble sleeping This list may not describe all possible side effects. Call your doctor for medical advice about side effects. You may report side effects to FDA at 1-800-FDA-1088. Where should I keep my medication? This medication is given in a hospital or clinic. It will not be stored at home. NOTE: This sheet is a summary. It may not cover all possible information. If you have questions about this medicine, talk to your doctor, pharmacist, or health care provider.  2023 Elsevier/Gold Standard (2021-04-22 00:00:00)

## 2022-04-07 DIAGNOSIS — H6123 Impacted cerumen, bilateral: Secondary | ICD-10-CM | POA: Diagnosis not present

## 2022-04-07 DIAGNOSIS — H9193 Unspecified hearing loss, bilateral: Secondary | ICD-10-CM | POA: Diagnosis not present

## 2022-04-14 DIAGNOSIS — Z8719 Personal history of other diseases of the digestive system: Secondary | ICD-10-CM | POA: Diagnosis not present

## 2022-04-14 DIAGNOSIS — D509 Iron deficiency anemia, unspecified: Secondary | ICD-10-CM | POA: Diagnosis not present

## 2022-04-14 DIAGNOSIS — K219 Gastro-esophageal reflux disease without esophagitis: Secondary | ICD-10-CM | POA: Diagnosis not present

## 2022-04-14 DIAGNOSIS — D693 Immune thrombocytopenic purpura: Secondary | ICD-10-CM | POA: Diagnosis not present

## 2022-04-14 DIAGNOSIS — K5900 Constipation, unspecified: Secondary | ICD-10-CM | POA: Diagnosis not present

## 2022-04-16 DIAGNOSIS — N401 Enlarged prostate with lower urinary tract symptoms: Secondary | ICD-10-CM | POA: Diagnosis not present

## 2022-04-16 DIAGNOSIS — R3121 Asymptomatic microscopic hematuria: Secondary | ICD-10-CM | POA: Diagnosis not present

## 2022-04-16 DIAGNOSIS — R351 Nocturia: Secondary | ICD-10-CM | POA: Diagnosis not present

## 2022-04-30 ENCOUNTER — Other Ambulatory Visit: Payer: Self-pay

## 2022-04-30 ENCOUNTER — Inpatient Hospital Stay: Payer: Medicare Other

## 2022-04-30 ENCOUNTER — Inpatient Hospital Stay: Payer: Medicare Other | Attending: Hematology & Oncology

## 2022-04-30 ENCOUNTER — Inpatient Hospital Stay (HOSPITAL_BASED_OUTPATIENT_CLINIC_OR_DEPARTMENT_OTHER): Payer: Medicare Other | Admitting: Medical Oncology

## 2022-04-30 ENCOUNTER — Encounter: Payer: Self-pay | Admitting: Medical Oncology

## 2022-04-30 VITALS — BP 131/72 | HR 58 | Temp 98.2°F | Resp 18 | Ht 68.0 in | Wt 187.4 lb

## 2022-04-30 DIAGNOSIS — M7989 Other specified soft tissue disorders: Secondary | ICD-10-CM | POA: Insufficient documentation

## 2022-04-30 DIAGNOSIS — Z79899 Other long term (current) drug therapy: Secondary | ICD-10-CM | POA: Insufficient documentation

## 2022-04-30 DIAGNOSIS — R030 Elevated blood-pressure reading, without diagnosis of hypertension: Secondary | ICD-10-CM | POA: Diagnosis not present

## 2022-04-30 DIAGNOSIS — D693 Immune thrombocytopenic purpura: Secondary | ICD-10-CM

## 2022-04-30 DIAGNOSIS — D509 Iron deficiency anemia, unspecified: Secondary | ICD-10-CM

## 2022-04-30 LAB — CMP (CANCER CENTER ONLY)
ALT: 11 U/L (ref 0–44)
AST: 24 U/L (ref 15–41)
Albumin: 4.3 g/dL (ref 3.5–5.0)
Alkaline Phosphatase: 41 U/L (ref 38–126)
Anion gap: 9 (ref 5–15)
BUN: 16 mg/dL (ref 8–23)
CO2: 27 mmol/L (ref 22–32)
Calcium: 9.1 mg/dL (ref 8.9–10.3)
Chloride: 99 mmol/L (ref 98–111)
Creatinine: 1.21 mg/dL (ref 0.61–1.24)
GFR, Estimated: >60 mL/min (ref >60)
Glucose, Bld: 103 mg/dL — ABNORMAL HIGH (ref 70–99)
Potassium: 4 mmol/L (ref 3.5–5.1)
Sodium: 135 mmol/L (ref 135–145)
Total Bilirubin: 0.8 mg/dL (ref 0.3–1.2)
Total Protein: 7.5 g/dL (ref 6.5–8.1)

## 2022-04-30 LAB — CBC WITH DIFFERENTIAL (CANCER CENTER ONLY)
Abs Immature Granulocytes: 0.02 10*3/uL (ref 0.00–0.07)
Basophils Absolute: 0 10*3/uL (ref 0.0–0.1)
Basophils Relative: 1 %
Eosinophils Absolute: 0.2 10*3/uL (ref 0.0–0.5)
Eosinophils Relative: 3 %
HCT: 32.7 % — ABNORMAL LOW (ref 39.0–52.0)
Hemoglobin: 10.7 g/dL — ABNORMAL LOW (ref 13.0–17.0)
Immature Granulocytes: 0 %
Lymphocytes Relative: 19 %
Lymphs Abs: 1.2 10*3/uL (ref 0.7–4.0)
MCH: 30.3 pg (ref 26.0–34.0)
MCHC: 32.7 g/dL (ref 30.0–36.0)
MCV: 92.6 fL (ref 80.0–100.0)
Monocytes Absolute: 0.6 10*3/uL (ref 0.1–1.0)
Monocytes Relative: 10 %
Neutro Abs: 4.1 10*3/uL (ref 1.7–7.7)
Neutrophils Relative %: 67 %
Platelet Count: 67 10*3/uL — ABNORMAL LOW (ref 150–400)
RBC: 3.53 MIL/uL — ABNORMAL LOW (ref 4.22–5.81)
RDW: 14.1 % (ref 11.5–15.5)
WBC Count: 6.2 10*3/uL (ref 4.0–10.5)
nRBC: 0 % (ref 0.0–0.2)

## 2022-04-30 LAB — SAVE SMEAR(SSMR), FOR PROVIDER SLIDE REVIEW

## 2022-04-30 MED ORDER — ROMIPLOSTIM 125 MCG ~~LOC~~ SOLR
95.0000 ug | Freq: Once | SUBCUTANEOUS | Status: AC
  Administered 2022-04-30: 95 ug via SUBCUTANEOUS
  Filled 2022-04-30: qty 0.19

## 2022-04-30 NOTE — Progress Notes (Signed)
Hematology and Oncology Follow Up Visit  Lonnie Hansen [id number] 1950/12/18 72 y.o. 04/30/2022   Principle Diagnosis:  Chronic ITP   Current Therapy:        Nplate to keep platelet count over 100,000   Interim History:  Lonnie Hansen is here today for follow-up.He is on Nplate therapy. He is hoping to get his values above 100,000 so that he can have right hip replacement surgery however he is hesitant as he has had significant blood loss on prior surgeries prior to his ITP diagnosis.   He reports that he has been doing well otherwise. Few splotched of small bruising here and there but nothing significant and no known significant blood loss.   He has had no change in bowel or bladder habits.  He has had no cough or shortness of breath.  He has had no issues with nausea or vomiting.  He has little bit of leg swelling but this is chronic.  Overall, I would say that his performance status is probably ECOG 1.     Wt Readings from Last 3 Encounters:  04/30/22 187 lb 6.4 oz (85 kg)  03/27/22 187 lb (84.8 kg)  02/13/22 183 lb (83 kg)     Medications:  Allergies as of 04/30/2022   No Known Allergies      Medication List        Accurate as of April 30, 2022  2:29 PM. If you have any questions, ask your nurse or doctor.          STOP taking these medications    levofloxacin 500 MG tablet Commonly known as: LEVAQUIN Stopped by: Hughie Closs, PA-C   nitrofurantoin (macrocrystal-monohydrate) 100 MG capsule Commonly known as: MACROBID Stopped by: Hughie Closs, PA-C       TAKE these medications    acetaminophen 500 MG tablet Commonly known as: TYLENOL Take 1 tablet (500 mg total) by mouth every 8 (eight) hours as needed for fever (pain).   amLODipine 10 MG tablet Commonly known as: NORVASC Take 10 mg by mouth daily.   aspirin 81 MG chewable tablet Chew 1 tablet (81 mg total) by mouth daily.   atenolol 25 MG tablet Commonly known as: TENORMIN Take 12.5 mg  by mouth 2 (two) times daily.   benzonatate 100 MG capsule Commonly known as: TESSALON Take 100 mg by mouth 3 (three) times daily.   fluticasone 50 MCG/ACT nasal spray Commonly known as: FLONASE Place 2 sprays into both nostrils daily as needed for allergies or rhinitis.   latanoprost 0.005 % ophthalmic solution Commonly known as: XALATAN Place 1 drop into both eyes at bedtime.   multivitamin with minerals Tabs tablet Take 1 tablet by mouth daily.   omega-3 acid ethyl esters 1 g capsule Commonly known as: LOVAZA Take 2 g by mouth 2 (two) times daily.   pantoprazole 40 MG tablet Commonly known as: PROTONIX Take 40 mg by mouth 2 (two) times daily.   polyethylene glycol 17 g packet Commonly known as: MIRALAX / GLYCOLAX Take 17 g by mouth daily as needed for mild constipation. Every other day   psyllium 95 % Pack Commonly known as: HYDROCIL/METAMUCIL Take 1 packet by mouth daily as needed for mild constipation.   rosuvastatin 20 MG tablet Commonly known as: CRESTOR Take 20 mg by mouth daily.   tamsulosin 0.4 MG Caps capsule Commonly known as: FLOMAX Take 0.4 mg by mouth daily.   triamcinolone 0.025 % cream Commonly known as: KENALOG Apply  1 Application topically daily as needed (skin irritation/rash.).        Allergies: No Known Allergies  Past Medical History, Surgical history, Social history, and Family History were reviewed and updated.  Review of Systems: Review of Systems  Constitutional: Negative.   HENT: Negative.    Eyes: Negative.   Respiratory: Negative.    Cardiovascular: Negative.   Gastrointestinal: Negative.   Genitourinary: Negative.   Musculoskeletal: Negative.   Skin: Negative.   Neurological: Negative.   Endo/Heme/Allergies: Negative.   Psychiatric/Behavioral: Negative.       Physical Exam:  height is 5\' 8"  (1.727 m) and weight is 187 lb 6.4 oz (85 kg). His oral temperature is 98.2 F (36.8 C). His blood pressure is 131/72 and his  pulse is 58 (abnormal). His respiration is 18 and oxygen saturation is 100%.   Wt Readings from Last 3 Encounters:  04/30/22 187 lb 6.4 oz (85 kg)  03/27/22 187 lb (84.8 kg)  02/13/22 183 lb (83 kg)    Physical Exam Vitals reviewed.  HENT:     Head: Normocephalic and atraumatic.  Eyes:     Pupils: Pupils are equal, round, and reactive to light.  Cardiovascular:     Rate and Rhythm: Normal rate and regular rhythm.     Heart sounds: Normal heart sounds.  Pulmonary:     Effort: Pulmonary effort is normal.     Breath sounds: Normal breath sounds.  Musculoskeletal:        General: No tenderness or deformity. Normal range of motion.     Cervical back: Normal range of motion.  Lymphadenopathy:     Cervical: No cervical adenopathy.  Skin:    General: Skin is warm and dry.     Findings: No erythema or rash.     Comments: No visible ecchymosis  Neurological:     Mental Status: He is alert and oriented to person, place, and time.  Psychiatric:        Behavior: Behavior normal.        Thought Content: Thought content normal.        Judgment: Judgment normal.      Lab Results  Component Value Date   WBC 6.2 04/30/2022   HGB 10.7 (L) 04/30/2022   HCT 32.7 (L) 04/30/2022   MCV 92.6 04/30/2022   PLT 67 (L) 04/30/2022   Lab Results  Component Value Date   FERRITIN 28 10/31/2021   IRON 91 10/31/2021   TIBC 419 10/31/2021   UIBC 328 10/31/2021   IRONPCTSAT 22 10/31/2021   Lab Results  Component Value Date   RETICCTPCT 2.1 10/31/2021   RBC 3.53 (L) 04/30/2022   No results found for: "KPAFRELGTCHN", "LAMBDASER", "KAPLAMBRATIO" No results found for: "IGGSERUM", "IGA", "IGMSERUM" No results found for: "TOTALPROTELP", "ALBUMINELP", "A1GS", "A2GS", "BETS", "BETA2SER", "GAMS", "MSPIKE", "SPEI"   Chemistry      Component Value Date/Time   NA 135 04/30/2022 1232   NA 140 05/11/2019 0946   K 4.0 04/30/2022 1232   CL 99 04/30/2022 1232   CO2 27 04/30/2022 1232   BUN 16  04/30/2022 1232   BUN 17 05/11/2019 0946   CREATININE 1.21 04/30/2022 1232   CREATININE 0.75 04/10/2016 1132      Component Value Date/Time   CALCIUM 9.1 04/30/2022 1232   ALKPHOS 41 04/30/2022 1232   AST 24 04/30/2022 1232   ALT 11 04/30/2022 1232   BILITOT 0.8 04/30/2022 1232       Impression and Plan: Lonnie Hansen is  a very pleasant 72 yo [place] American gentleman with chronic ITP.    Platelets are stable at 67 today. Hemoglobin is also stable. Nplate per protocol.   RTC 4 weeks APP, labs. [place], PA-C 4/4/20242:29 PM

## 2022-04-30 NOTE — Patient Instructions (Signed)
Romiplostim Injection What is this medication? ROMIPLOSTIM (roe mi PLOE stim) treats low levels of platelets in your body caused by immune thrombocytopenia (ITP). It is prescribed when other medications have not worked or cannot be tolerated. It may also be used to help people who have been exposed to high doses of radiation. It works by increasing the amount of platelets in your blood. This lowers the risk of bleeding. This medicine may be used for other purposes; ask your health care provider or pharmacist if you have questions. COMMON BRAND NAME(S): Nplate What should I tell my care team before I take this medication? They need to know if you have any of these conditions: Blood clots Myelodysplastic syndrome An unusual or allergic reaction to romiplostim, mannitol, other medications, foods, dyes, or preservatives Pregnant or trying to get pregnant Breast-feeding How should I use this medication? This medication is injected under the skin. It is given by a care team in a hospital or clinic setting. A special MedGuide will be given to you before each treatment. Be sure to read this information carefully each time. Talk to your care team about the use of this medication in children. While it may be prescribed for children as young as newborns for selected conditions, precautions do apply. Overdosage: If you think you have taken too much of this medicine contact a poison control center or emergency room at once. NOTE: This medicine is only for you. Do not share this medicine with others. What if I miss a dose? Keep appointments for follow-up doses. It is important not to miss your dose. Call your care team if you are unable to keep an appointment. What may interact with this medication? Interactions are not expected. This list may not describe all possible interactions. Give your health care provider a list of all the medicines, herbs, non-prescription drugs, or dietary supplements you use. Also  tell them if you smoke, drink alcohol, or use illegal drugs. Some items may interact with your medicine. What should I watch for while using this medication? Visit your care team for regular checks on your progress. You may need blood work done while you are taking this medication. Your condition will be monitored carefully while you are receiving this medication. It is important not to miss any appointments. What side effects may I notice from receiving this medication? Side effects that you should report to your care team as soon as possible: Allergic reactions--skin rash, itching, hives, swelling of the face, lips, tongue, or throat Blood clot--pain, swelling, or warmth in the leg, shortness of breath, chest pain Side effects that usually do not require medical attention (report to your care team if they continue or are bothersome): Dizziness Joint pain Muscle pain Pain in the hands or feet Stomach pain Trouble sleeping This list may not describe all possible side effects. Call your doctor for medical advice about side effects. You may report side effects to FDA at 1-800-FDA-1088. Where should I keep my medication? This medication is given in a hospital or clinic. It will not be stored at home. NOTE: This sheet is a summary. It may not cover all possible information. If you have questions about this medicine, talk to your doctor, pharmacist, or health care provider.  2023 Elsevier/Gold Standard (2021-04-22 00:00:00)  

## 2022-05-06 ENCOUNTER — Other Ambulatory Visit: Payer: Self-pay | Admitting: Surgery

## 2022-05-06 DIAGNOSIS — I7121 Aneurysm of the ascending aorta, without rupture: Secondary | ICD-10-CM

## 2022-05-18 ENCOUNTER — Inpatient Hospital Stay: Payer: Medicare Other

## 2022-05-18 ENCOUNTER — Inpatient Hospital Stay (HOSPITAL_BASED_OUTPATIENT_CLINIC_OR_DEPARTMENT_OTHER): Payer: Medicare Other | Admitting: Medical Oncology

## 2022-05-18 ENCOUNTER — Encounter: Payer: Self-pay | Admitting: Medical Oncology

## 2022-05-18 VITALS — BP 151/97 | HR 57 | Temp 98.5°F | Resp 18 | Wt 187.6 lb

## 2022-05-18 DIAGNOSIS — D693 Immune thrombocytopenic purpura: Secondary | ICD-10-CM

## 2022-05-18 DIAGNOSIS — D509 Iron deficiency anemia, unspecified: Secondary | ICD-10-CM

## 2022-05-18 DIAGNOSIS — R030 Elevated blood-pressure reading, without diagnosis of hypertension: Secondary | ICD-10-CM | POA: Diagnosis not present

## 2022-05-18 DIAGNOSIS — Z79899 Other long term (current) drug therapy: Secondary | ICD-10-CM | POA: Diagnosis not present

## 2022-05-18 DIAGNOSIS — M7989 Other specified soft tissue disorders: Secondary | ICD-10-CM | POA: Diagnosis not present

## 2022-05-18 LAB — CBC WITH DIFFERENTIAL (CANCER CENTER ONLY)
Abs Immature Granulocytes: 0.02 10*3/uL (ref 0.00–0.07)
Basophils Absolute: 0 10*3/uL (ref 0.0–0.1)
Basophils Relative: 0 %
Eosinophils Absolute: 0.3 10*3/uL (ref 0.0–0.5)
Eosinophils Relative: 4 %
HCT: 33.2 % — ABNORMAL LOW (ref 39.0–52.0)
Hemoglobin: 10.9 g/dL — ABNORMAL LOW (ref 13.0–17.0)
Immature Granulocytes: 0 %
Lymphocytes Relative: 18 %
Lymphs Abs: 1.2 10*3/uL (ref 0.7–4.0)
MCH: 30.4 pg (ref 26.0–34.0)
MCHC: 32.8 g/dL (ref 30.0–36.0)
MCV: 92.5 fL (ref 80.0–100.0)
Monocytes Absolute: 0.8 10*3/uL (ref 0.1–1.0)
Monocytes Relative: 11 %
Neutro Abs: 4.5 10*3/uL (ref 1.7–7.7)
Neutrophils Relative %: 67 %
Platelet Count: 93 10*3/uL — ABNORMAL LOW (ref 150–400)
RBC: 3.59 MIL/uL — ABNORMAL LOW (ref 4.22–5.81)
RDW: 13.8 % (ref 11.5–15.5)
WBC Count: 6.8 10*3/uL (ref 4.0–10.5)
nRBC: 0 % (ref 0.0–0.2)

## 2022-05-18 LAB — CMP (CANCER CENTER ONLY)
ALT: 7 U/L (ref 0–44)
AST: 20 U/L (ref 15–41)
Albumin: 4.5 g/dL (ref 3.5–5.0)
Alkaline Phosphatase: 47 U/L (ref 38–126)
Anion gap: 10 (ref 5–15)
BUN: 16 mg/dL (ref 8–23)
CO2: 28 mmol/L (ref 22–32)
Calcium: 9.4 mg/dL (ref 8.9–10.3)
Chloride: 104 mmol/L (ref 98–111)
Creatinine: 1.32 mg/dL — ABNORMAL HIGH (ref 0.61–1.24)
GFR, Estimated: 57 mL/min — ABNORMAL LOW (ref >60)
Glucose, Bld: 94 mg/dL (ref 70–99)
Potassium: 3.5 mmol/L (ref 3.5–5.1)
Sodium: 142 mmol/L (ref 135–145)
Total Bilirubin: 0.6 mg/dL (ref 0.3–1.2)
Total Protein: 7.4 g/dL (ref 6.5–8.1)

## 2022-05-18 MED ORDER — ROMIPLOSTIM 125 MCG ~~LOC~~ SOLR
95.0000 ug | Freq: Once | SUBCUTANEOUS | Status: AC
  Administered 2022-05-18: 95 ug via SUBCUTANEOUS
  Filled 2022-05-18: qty 0.19

## 2022-05-18 NOTE — Progress Notes (Unsigned)
Advised pt to make PCP or cardiologist aware of high b/p readings today.

## 2022-05-18 NOTE — Patient Instructions (Signed)
Romiplostim Injection What is this medication? ROMIPLOSTIM (roe mi PLOE stim) treats low levels of platelets in your body caused by immune thrombocytopenia (ITP). It is prescribed when other medications have not worked or cannot be tolerated. It may also be used to help people who have been exposed to high doses of radiation. It works by increasing the amount of platelets in your blood. This lowers the risk of bleeding. This medicine may be used for other purposes; ask your health care provider or pharmacist if you have questions. COMMON BRAND NAME(S): Nplate What should I tell my care team before I take this medication? They need to know if you have any of these conditions: Blood clots Myelodysplastic syndrome An unusual or allergic reaction to romiplostim, mannitol, other medications, foods, dyes, or preservatives Pregnant or trying to get pregnant Breast-feeding How should I use this medication? This medication is injected under the skin. It is given by a care team in a hospital or clinic setting. A special MedGuide will be given to you before each treatment. Be sure to read this information carefully each time. Talk to your care team about the use of this medication in children. While it may be prescribed for children as young as newborns for selected conditions, precautions do apply. Overdosage: If you think you have taken too much of this medicine contact a poison control center or emergency room at once. NOTE: This medicine is only for you. Do not share this medicine with others. What if I miss a dose? Keep appointments for follow-up doses. It is important not to miss your dose. Call your care team if you are unable to keep an appointment. What may interact with this medication? Interactions are not expected. This list may not describe all possible interactions. Give your health care provider a list of all the medicines, herbs, non-prescription drugs, or dietary supplements you use. Also  tell them if you smoke, drink alcohol, or use illegal drugs. Some items may interact with your medicine. What should I watch for while using this medication? Visit your care team for regular checks on your progress. You may need blood work done while you are taking this medication. Your condition will be monitored carefully while you are receiving this medication. It is important not to miss any appointments. What side effects may I notice from receiving this medication? Side effects that you should report to your care team as soon as possible: Allergic reactions--skin rash, itching, hives, swelling of the face, lips, tongue, or throat Blood clot--pain, swelling, or warmth in the leg, shortness of breath, chest pain Side effects that usually do not require medical attention (report to your care team if they continue or are bothersome): Dizziness Joint pain Muscle pain Pain in the hands or feet Stomach pain Trouble sleeping This list may not describe all possible side effects. Call your doctor for medical advice about side effects. You may report side effects to FDA at 1-800-FDA-1088. Where should I keep my medication? This medication is given in a hospital or clinic. It will not be stored at home. NOTE: This sheet is a summary. It may not cover all possible information. If you have questions about this medicine, talk to your doctor, pharmacist, or health care provider.  2023 Elsevier/Gold Standard (2021-04-22 00:00:00)  

## 2022-05-18 NOTE — Progress Notes (Unsigned)
Hematology and Oncology Follow Up Visit  Lonnie Hansen [id number] 1950/03/20 72 y.o. 05/18/2022   Principle Diagnosis:  Chronic ITP   Current Therapy:        Nplate to keep platelet count over 100,000   Interim History:  Lonnie Hansen is here today for follow-up.He is on Nplate therapy. He is hoping to get his values above 100,000 so that he can have right hip replacement surgery however he is hesitant as he has had significant blood loss on prior surgeries prior to his ITP diagnosis.   Today patient states that he is doing well. He has no concerns. He denies any bleeding episodes and only mild bruising on occasion. No hemoptysis, hematuria, epistaxis or bloody stools.   He has had no change in bowel or bladder habits.  He has had no cough or shortness of breath.  He has had no issues with nausea or vomiting.   Overall, I would say that his performance status is probably ECOG 1.     Wt Readings from Last 3 Encounters:  05/18/22 187 lb 9.6 oz (85.1 kg)  04/30/22 187 lb 6.4 oz (85 kg)  03/27/22 187 lb (84.8 kg)     Medications:  Allergies as of 05/18/2022   No Known Allergies      Medication List        Accurate as of May 18, 2022  1:40 PM. If you have any questions, ask your nurse or doctor.          acetaminophen 500 MG tablet Commonly known as: TYLENOL Take 1 tablet (500 mg total) by mouth every 8 (eight) hours as needed for fever (pain).   amLODipine 10 MG tablet Commonly known as: NORVASC Take 10 mg by mouth daily.   aspirin 81 MG chewable tablet Chew 1 tablet (81 mg total) by mouth daily.   atenolol 25 MG tablet Commonly known as: TENORMIN Take 12.5 mg by mouth 2 (two) times daily.   benzonatate 100 MG capsule Commonly known as: TESSALON Take 100 mg by mouth 3 (three) times daily.   fluticasone 50 MCG/ACT nasal spray Commonly known as: FLONASE Place 2 sprays into both nostrils daily as needed for allergies or rhinitis.   latanoprost 0.005 %  ophthalmic solution Commonly known as: XALATAN Place 1 drop into both eyes at bedtime.   multivitamin with minerals Tabs tablet Take 1 tablet by mouth daily.   omega-3 acid ethyl esters 1 g capsule Commonly known as: LOVAZA Take 2 g by mouth 2 (two) times daily.   pantoprazole 40 MG tablet Commonly known as: PROTONIX Take 40 mg by mouth 2 (two) times daily.   polyethylene glycol 17 g packet Commonly known as: MIRALAX / GLYCOLAX Take 17 g by mouth daily as needed for mild constipation. Every other day   psyllium 95 % Pack Commonly known as: HYDROCIL/METAMUCIL Take 1 packet by mouth daily as needed for mild constipation.   rosuvastatin 20 MG tablet Commonly known as: CRESTOR Take 20 mg by mouth daily.   tamsulosin 0.4 MG Caps capsule Commonly known as: FLOMAX Take 0.4 mg by mouth daily.   triamcinolone 0.025 % cream Commonly known as: KENALOG Apply 1 Application topically daily as needed (skin irritation/rash.).        Allergies: No Known Allergies  Past Medical History, Surgical history, Social history, and Family History were reviewed and updated.  Review of Systems: Review of Systems  Constitutional: Negative.   HENT: Negative.    Eyes: Negative.  Respiratory: Negative.    Cardiovascular: Negative.   Gastrointestinal: Negative.   Genitourinary: Negative.   Musculoskeletal: Negative.   Skin: Negative.   Neurological: Negative.   Endo/Heme/Allergies: Negative.   Psychiatric/Behavioral: Negative.       Physical Exam:  weight is 187 lb 9.6 oz (85.1 kg). His oral temperature is 98.5 F (36.9 C). His blood pressure is 151/97 (abnormal) and his pulse is 57 (abnormal). His respiration is 18 and oxygen saturation is 100%.   Wt Readings from Last 3 Encounters:  05/18/22 187 lb 9.6 oz (85.1 kg)  04/30/22 187 lb 6.4 oz (85 kg)  03/27/22 187 lb (84.8 kg)    Physical Exam Vitals reviewed.  HENT:     Head: Normocephalic and atraumatic.  Eyes:     Pupils:  Pupils are equal, round, and reactive to light.  Cardiovascular:     Rate and Rhythm: Normal rate and regular rhythm.     Heart sounds: Normal heart sounds.  Pulmonary:     Effort: Pulmonary effort is normal.     Breath sounds: Normal breath sounds.  Musculoskeletal:        General: No tenderness or deformity. Normal range of motion.     Cervical back: Normal range of motion.  Lymphadenopathy:     Cervical: No cervical adenopathy.  Skin:    General: Skin is warm and dry.     Findings: No erythema or rash.     Comments: Few scattered small areas of ecchymosis  Neurological:     Mental Status: He is alert and oriented to person, place, and time.  Psychiatric:        Behavior: Behavior normal.        Thought Content: Thought content normal.        Judgment: Judgment normal.      Lab Results  Component Value Date   WBC 6.8 05/18/2022   HGB 10.9 (L) 05/18/2022   HCT 33.2 (L) 05/18/2022   MCV 92.5 05/18/2022   PLT 93 (L) 05/18/2022   Lab Results  Component Value Date   FERRITIN 28 10/31/2021   IRON 91 10/31/2021   TIBC 419 10/31/2021   UIBC 328 10/31/2021   IRONPCTSAT 22 10/31/2021   Lab Results  Component Value Date   RETICCTPCT 2.1 10/31/2021   RBC 3.59 (L) 05/18/2022   No results found for: "KPAFRELGTCHN", "LAMBDASER", "KAPLAMBRATIO" No results found for: "IGGSERUM", "IGA", "IGMSERUM" No results found for: "TOTALPROTELP", "ALBUMINELP", "A1GS", "A2GS", "BETS", "BETA2SER", "GAMS", "MSPIKE", "SPEI"   Chemistry      Component Value Date/Time   NA 135 04/30/2022 1232   NA 140 05/11/2019 0946   K 4.0 04/30/2022 1232   CL 99 04/30/2022 1232   CO2 27 04/30/2022 1232   BUN 16 04/30/2022 1232   BUN 17 05/11/2019 0946   CREATININE 1.21 04/30/2022 1232   CREATININE 0.75 04/10/2016 1132      Component Value Date/Time   CALCIUM 9.1 04/30/2022 1232   ALKPHOS 41 04/30/2022 1232   AST 24 04/30/2022 1232   ALT 11 04/30/2022 1232   BILITOT 0.8 04/30/2022 1232        Impression and Plan: Lonnie Hansen is a very pleasant 72 yo African American gentleman with chronic ITP.    Platelets are stable at 93 today. Hemoglobin has improved to 10.9. Nplate per protocol today  He calls his PCP while in office to schedule a visit to discuss his elevated blood pressure. He will see them within the next week.  Disposition: Nplate today RTC 4 weeks APP, labs. Nplate   Brand Males [place], PA-C 4/22/20241:40 PM

## 2022-05-19 ENCOUNTER — Encounter: Payer: Self-pay | Admitting: Hematology & Oncology

## 2022-05-19 DIAGNOSIS — M25551 Pain in right hip: Secondary | ICD-10-CM | POA: Diagnosis not present

## 2022-05-19 DIAGNOSIS — I700 Atherosclerosis of aorta: Secondary | ICD-10-CM | POA: Diagnosis not present

## 2022-05-19 DIAGNOSIS — G4733 Obstructive sleep apnea (adult) (pediatric): Secondary | ICD-10-CM | POA: Diagnosis not present

## 2022-05-19 DIAGNOSIS — I2510 Atherosclerotic heart disease of native coronary artery without angina pectoris: Secondary | ICD-10-CM | POA: Diagnosis not present

## 2022-05-19 DIAGNOSIS — D696 Thrombocytopenia, unspecified: Secondary | ICD-10-CM | POA: Diagnosis not present

## 2022-05-19 DIAGNOSIS — I10 Essential (primary) hypertension: Secondary | ICD-10-CM | POA: Diagnosis not present

## 2022-06-10 ENCOUNTER — Other Ambulatory Visit: Payer: Self-pay

## 2022-06-10 DIAGNOSIS — I7143 Infrarenal abdominal aortic aneurysm, without rupture: Secondary | ICD-10-CM

## 2022-06-16 ENCOUNTER — Inpatient Hospital Stay: Payer: Medicare Other | Attending: Hematology & Oncology

## 2022-06-16 ENCOUNTER — Inpatient Hospital Stay: Payer: Medicare Other

## 2022-06-16 ENCOUNTER — Inpatient Hospital Stay (HOSPITAL_BASED_OUTPATIENT_CLINIC_OR_DEPARTMENT_OTHER): Payer: Medicare Other | Admitting: Family

## 2022-06-16 ENCOUNTER — Encounter: Payer: Self-pay | Admitting: Family

## 2022-06-16 VITALS — BP 132/81 | HR 58 | Temp 99.1°F | Resp 18 | Wt 189.0 lb

## 2022-06-16 DIAGNOSIS — D693 Immune thrombocytopenic purpura: Secondary | ICD-10-CM

## 2022-06-16 DIAGNOSIS — R5383 Other fatigue: Secondary | ICD-10-CM | POA: Diagnosis not present

## 2022-06-16 DIAGNOSIS — Z79899 Other long term (current) drug therapy: Secondary | ICD-10-CM | POA: Insufficient documentation

## 2022-06-16 DIAGNOSIS — D509 Iron deficiency anemia, unspecified: Secondary | ICD-10-CM

## 2022-06-16 LAB — CBC WITH DIFFERENTIAL (CANCER CENTER ONLY)
Abs Immature Granulocytes: 0.01 10*3/uL (ref 0.00–0.07)
Basophils Absolute: 0 10*3/uL (ref 0.0–0.1)
Basophils Relative: 1 %
Eosinophils Absolute: 0.2 10*3/uL (ref 0.0–0.5)
Eosinophils Relative: 3 %
HCT: 32.8 % — ABNORMAL LOW (ref 39.0–52.0)
Hemoglobin: 10.9 g/dL — ABNORMAL LOW (ref 13.0–17.0)
Immature Granulocytes: 0 %
Lymphocytes Relative: 22 %
Lymphs Abs: 1.2 10*3/uL (ref 0.7–4.0)
MCH: 30.3 pg (ref 26.0–34.0)
MCHC: 33.2 g/dL (ref 30.0–36.0)
MCV: 91.1 fL (ref 80.0–100.0)
Monocytes Absolute: 0.6 10*3/uL (ref 0.1–1.0)
Monocytes Relative: 10 %
Neutro Abs: 3.5 10*3/uL (ref 1.7–7.7)
Neutrophils Relative %: 64 %
Platelet Count: 57 10*3/uL — ABNORMAL LOW (ref 150–400)
RBC: 3.6 MIL/uL — ABNORMAL LOW (ref 4.22–5.81)
RDW: 13.1 % (ref 11.5–15.5)
WBC Count: 5.5 10*3/uL (ref 4.0–10.5)
nRBC: 0 % (ref 0.0–0.2)

## 2022-06-16 LAB — CMP (CANCER CENTER ONLY)
ALT: 11 U/L (ref 0–44)
AST: 24 U/L (ref 15–41)
Albumin: 4.3 g/dL (ref 3.5–5.0)
Alkaline Phosphatase: 41 U/L (ref 38–126)
Anion gap: 9 (ref 5–15)
BUN: 25 mg/dL — ABNORMAL HIGH (ref 8–23)
CO2: 24 mmol/L (ref 22–32)
Calcium: 9.2 mg/dL (ref 8.9–10.3)
Chloride: 103 mmol/L (ref 98–111)
Creatinine: 1.49 mg/dL — ABNORMAL HIGH (ref 0.61–1.24)
GFR, Estimated: 50 mL/min — ABNORMAL LOW (ref >60)
Glucose, Bld: 101 mg/dL — ABNORMAL HIGH (ref 70–99)
Potassium: 3.9 mmol/L (ref 3.5–5.1)
Sodium: 136 mmol/L (ref 135–145)
Total Bilirubin: 0.6 mg/dL (ref 0.3–1.2)
Total Protein: 7.7 g/dL (ref 6.5–8.1)

## 2022-06-16 MED ORDER — ROMIPLOSTIM 125 MCG ~~LOC~~ SOLR
95.0000 ug | Freq: Once | SUBCUTANEOUS | Status: AC
  Administered 2022-06-16: 95 ug via SUBCUTANEOUS
  Filled 2022-06-16: qty 0.19

## 2022-06-16 NOTE — Patient Instructions (Signed)
Romiplostim Injection What is this medication? ROMIPLOSTIM (roe mi PLOE stim) treats low levels of platelets in your body caused by immune thrombocytopenia (ITP). It is prescribed when other medications have not worked or cannot be tolerated. It may also be used to help people who have been exposed to high doses of radiation. It works by increasing the amount of platelets in your blood. This lowers the risk of bleeding. This medicine may be used for other purposes; ask your health care provider or pharmacist if you have questions. COMMON BRAND NAME(S): Nplate What should I tell my care team before I take this medication? They need to know if you have any of these conditions: Blood clots Myelodysplastic syndrome An unusual or allergic reaction to romiplostim, mannitol, other medications, foods, dyes, or preservatives Pregnant or trying to get pregnant Breast-feeding How should I use this medication? This medication is injected under the skin. It is given by a care team in a hospital or clinic setting. A special MedGuide will be given to you before each treatment. Be sure to read this information carefully each time. Talk to your care team about the use of this medication in children. While it may be prescribed for children as young as newborns for selected conditions, precautions do apply. Overdosage: If you think you have taken too much of this medicine contact a poison control center or emergency room at once. NOTE: This medicine is only for you. Do not share this medicine with others. What if I miss a dose? Keep appointments for follow-up doses. It is important not to miss your dose. Call your care team if you are unable to keep an appointment. What may interact with this medication? Interactions are not expected. This list may not describe all possible interactions. Give your health care provider a list of all the medicines, herbs, non-prescription drugs, or dietary supplements you use. Also  tell them if you smoke, drink alcohol, or use illegal drugs. Some items may interact with your medicine. What should I watch for while using this medication? Visit your care team for regular checks on your progress. You may need blood work done while you are taking this medication. Your condition will be monitored carefully while you are receiving this medication. It is important not to miss any appointments. What side effects may I notice from receiving this medication? Side effects that you should report to your care team as soon as possible: Allergic reactions--skin rash, itching, hives, swelling of the face, lips, tongue, or throat Blood clot--pain, swelling, or warmth in the leg, shortness of breath, chest pain Side effects that usually do not require medical attention (report to your care team if they continue or are bothersome): Dizziness Joint pain Muscle pain Pain in the hands or feet Stomach pain Trouble sleeping This list may not describe all possible side effects. Call your doctor for medical advice about side effects. You may report side effects to FDA at 1-800-FDA-1088. Where should I keep my medication? This medication is given in a hospital or clinic. It will not be stored at home. NOTE: This sheet is a summary. It may not cover all possible information. If you have questions about this medicine, talk to your doctor, pharmacist, or health care provider.  2023 Elsevier/Gold Standard (2021-04-22 00:00:00)  

## 2022-06-16 NOTE — Progress Notes (Signed)
Hematology and Oncology Follow Up Visit  Lonnie Hansen [id number] 06/08/1950 72 y.o. 06/16/2022   Principle Diagnosis:  Chronic ITP   Current Therapy:        Nplate to keep platelet count over 100,000   Interim History:  Lonnie Hansen is here today for follow-up and injection. He is doing well but has mild fatigue at times.  He still enjoys getting out and playing music.  Platelet count is 57, WBC count 5.5, Hgb 10.9 and MCV 91.  No issue with bleeding, bruising or petechiae.  No fever, chills, n/v, cough, rash, dizziness, SOB, chest pain, palpitations, abdominal pain or changes in bowel or bladder habits.  No swelling, tenderness, numbness or tingling in his extremities.  No falls or syncope.  Appetite and hydration are good. Weight is stable at 189 lbs.   ECOG Performance Status: 1 - Symptomatic but completely ambulatory  Medications:  Allergies as of 06/16/2022   No Known Allergies      Medication List        Accurate as of Jun 16, 2022  2:23 PM. If you have any questions, ask your nurse or doctor.          acetaminophen 500 MG tablet Commonly known as: TYLENOL Take 1 tablet (500 mg total) by mouth every 8 (eight) hours as needed for fever (pain).   amLODipine 10 MG tablet Commonly known as: NORVASC Take 10 mg by mouth daily.   aspirin 81 MG chewable tablet Chew 1 tablet (81 mg total) by mouth daily.   atenolol 25 MG tablet Commonly known as: TENORMIN Take 12.5 mg by mouth 2 (two) times daily.   benzonatate 100 MG capsule Commonly known as: TESSALON Take 100 mg by mouth 3 (three) times daily.   fluticasone 50 MCG/ACT nasal spray Commonly known as: FLONASE Place 2 sprays into both nostrils daily as needed for allergies or rhinitis.   latanoprost 0.005 % ophthalmic solution Commonly known as: XALATAN Place 1 drop into both eyes at bedtime.   multivitamin with minerals Tabs tablet Take 1 tablet by mouth daily.   omega-3 acid ethyl esters 1 g  capsule Commonly known as: LOVAZA Take 2 g by mouth 2 (two) times daily.   pantoprazole 40 MG tablet Commonly known as: PROTONIX Take 40 mg by mouth 2 (two) times daily.   polyethylene glycol 17 g packet Commonly known as: MIRALAX / GLYCOLAX Take 17 g by mouth daily as needed for mild constipation. Every other day   psyllium 95 % Pack Commonly known as: HYDROCIL/METAMUCIL Take 1 packet by mouth daily as needed for mild constipation.   rosuvastatin 20 MG tablet Commonly known as: CRESTOR Take 20 mg by mouth daily.   tamsulosin 0.4 MG Caps capsule Commonly known as: FLOMAX Take 0.4 mg by mouth daily.   triamcinolone 0.025 % cream Commonly known as: KENALOG Apply 1 Application topically daily as needed (skin irritation/rash.).        Allergies: No Known Allergies  Past Medical History, Surgical history, Social history, and Family History were reviewed and updated.  Review of Systems: All other 10 point review of systems is negative.   Physical Exam:  weight is 189 lb (85.7 kg). His oral temperature is 99.1 F (37.3 C). His blood pressure is 132/81 and his pulse is 58 (abnormal). His respiration is 18 and oxygen saturation is 100%.   Wt Readings from Last 3 Encounters:  06/16/22 189 lb (85.7 kg)  05/18/22 187 lb 9.6 oz (85.1 kg)  04/30/22 187 lb 6.4 oz (85 kg)    Ocular: Sclerae unicteric, pupils equal, round and reactive to light Ear-nose-throat: Oropharynx clear, dentition fair Lymphatic: No cervical or supraclavicular adenopathy Lungs no rales or rhonchi, good excursion bilaterally Heart regular rate and rhythm, no murmur appreciated Abd soft, nontender, positive bowel sounds MSK no focal spinal tenderness, no joint edema Neuro: non-focal, well-oriented, appropriate affect Breasts: Deferred   Lab Results  Component Value Date   WBC 5.5 06/16/2022   HGB 10.9 (L) 06/16/2022   HCT 32.8 (L) 06/16/2022   MCV 91.1 06/16/2022   PLT 57 (L) 06/16/2022   Lab  Results  Component Value Date   FERRITIN 28 10/31/2021   IRON 91 10/31/2021   TIBC 419 10/31/2021   UIBC 328 10/31/2021   IRONPCTSAT 22 10/31/2021   Lab Results  Component Value Date   RETICCTPCT 2.1 10/31/2021   RBC 3.60 (L) 06/16/2022   No results found for: "KPAFRELGTCHN", "LAMBDASER", "KAPLAMBRATIO" No results found for: "IGGSERUM", "IGA", "IGMSERUM" No results found for: "TOTALPROTELP", "ALBUMINELP", "A1GS", "A2GS", "BETS", "BETA2SER", "GAMS", "MSPIKE", "SPEI"   Chemistry      Component Value Date/Time   NA 142 05/18/2022 1250   NA 140 05/11/2019 0946   K 3.5 05/18/2022 1250   CL 104 05/18/2022 1250   CO2 28 05/18/2022 1250   BUN 16 05/18/2022 1250   BUN 17 05/11/2019 0946   CREATININE 1.32 (H) 05/18/2022 1250   CREATININE 0.75 04/10/2016 1132      Component Value Date/Time   CALCIUM 9.4 05/18/2022 1250   ALKPHOS 47 05/18/2022 1250   AST 20 05/18/2022 1250   ALT 7 05/18/2022 1250   BILITOT 0.6 05/18/2022 1250       Impression and Plan: Lonnie Hansen is a very pleasant 72 yo African American gentleman with chronic ITP.    Platelets are 54 and he remains asymptomatic at this time.  We will proceed with Nplate injection.  We will have him come back every 2 weeks for injection and follow-up in 4 weeks.   Eileen Stanford, NP 5/21/20242:23 PM

## 2022-06-24 ENCOUNTER — Encounter: Payer: Self-pay | Admitting: Surgery

## 2022-06-24 ENCOUNTER — Ambulatory Visit (INDEPENDENT_AMBULATORY_CARE_PROVIDER_SITE_OTHER): Payer: Medicare Other | Admitting: Surgery

## 2022-06-24 ENCOUNTER — Ambulatory Visit
Admission: RE | Admit: 2022-06-24 | Discharge: 2022-06-24 | Disposition: A | Discharge status: Home / Self Care | Payer: Medicare Other | Source: Ambulatory Visit | Attending: Surgery | Admitting: Surgery

## 2022-06-24 VITALS — BP 130/53 | HR 61 | Resp 18 | Ht 68.0 in | Wt 187.8 lb

## 2022-06-24 DIAGNOSIS — I7121 Aneurysm of the ascending aorta, without rupture: Secondary | ICD-10-CM

## 2022-06-24 MED ORDER — IOPAMIDOL (ISOVUE-370) INJECTION 76%
75.0000 mL | Freq: Once | INTRAVENOUS | Status: AC | PRN
  Administered 2022-06-24: 75 mL via INTRAVENOUS

## 2022-06-24 NOTE — Progress Notes (Signed)
    HPI: ***  Current Outpatient Medications  Medication Sig Dispense Refill   acetaminophen (TYLENOL) 500 MG tablet Take 1 tablet (500 mg total) by mouth every 8 (eight) hours as needed for fever (pain).     amLODipine (NORVASC) 10 MG tablet Take 10 mg by mouth daily.     aspirin 81 MG chewable tablet Chew 1 tablet (81 mg total) by mouth daily.     atenolol (TENORMIN) 25 MG tablet Take 12.5 mg by mouth 2 (two) times daily. 180 tablet 3   benzonatate (TESSALON) 100 MG capsule Take 100 mg by mouth 3 (three) times daily.     fluticasone (FLONASE) 50 MCG/ACT nasal spray Place 2 sprays into both nostrils daily as needed for allergies or rhinitis.     latanoprost (XALATAN) 0.005 % ophthalmic solution Place 1 drop into both eyes at bedtime.     Multiple Vitamin (MULTIVITAMIN WITH MINERALS) TABS tablet Take 1 tablet by mouth daily.     omega-3 acid ethyl esters (LOVAZA) 1 g capsule Take 2 g by mouth 2 (two) times daily.     pantoprazole (PROTONIX) 40 MG tablet Take 40 mg by mouth 2 (two) times daily.     polyethylene glycol (MIRALAX / GLYCOLAX) 17 g packet Take 17 g by mouth daily as needed for mild constipation. Every other day     psyllium (HYDROCIL/METAMUCIL) 95 % PACK Take 1 packet by mouth daily as needed for mild constipation.     rosuvastatin (CRESTOR) 20 MG tablet Take 20 mg by mouth daily.     tamsulosin (FLOMAX) 0.4 MG CAPS capsule Take 0.4 mg by mouth daily.     triamcinolone (KENALOG) 0.025 % cream Apply 1 Application topically daily as needed (skin irritation/rash.).     No current facility-administered medications for this visit.     Physical Exam: ***  Diagnostic Tests: ***  Impression: ***  Plan: ***   Alleen Borne, MD [facility] [phone]

## 2022-06-29 DIAGNOSIS — L089 Local infection of the skin and subcutaneous tissue, unspecified: Secondary | ICD-10-CM | POA: Diagnosis not present

## 2022-06-29 DIAGNOSIS — L0292 Furuncle, unspecified: Secondary | ICD-10-CM | POA: Diagnosis not present

## 2022-06-29 DIAGNOSIS — I10 Essential (primary) hypertension: Secondary | ICD-10-CM | POA: Diagnosis not present

## 2022-06-30 ENCOUNTER — Inpatient Hospital Stay: Payer: Medicare Other

## 2022-06-30 ENCOUNTER — Ambulatory Visit: Payer: Medicare Other

## 2022-06-30 ENCOUNTER — Inpatient Hospital Stay: Payer: Medicare Other | Attending: Hematology & Oncology

## 2022-06-30 DIAGNOSIS — D693 Immune thrombocytopenic purpura: Secondary | ICD-10-CM | POA: Diagnosis not present

## 2022-06-30 DIAGNOSIS — Z79899 Other long term (current) drug therapy: Secondary | ICD-10-CM | POA: Insufficient documentation

## 2022-06-30 LAB — CBC WITH DIFFERENTIAL (CANCER CENTER ONLY)
Abs Immature Granulocytes: 0.02 10*3/uL (ref 0.00–0.07)
Basophils Absolute: 0 10*3/uL (ref 0.0–0.1)
Basophils Relative: 0 %
Eosinophils Absolute: 0.2 10*3/uL (ref 0.0–0.5)
Eosinophils Relative: 3 %
HCT: 32.9 % — ABNORMAL LOW (ref 39.0–52.0)
Hemoglobin: 11 g/dL — ABNORMAL LOW (ref 13.0–17.0)
Immature Granulocytes: 0 %
Lymphocytes Relative: 18 %
Lymphs Abs: 1 10*3/uL (ref 0.7–4.0)
MCH: 30.7 pg (ref 26.0–34.0)
MCHC: 33.4 g/dL (ref 30.0–36.0)
MCV: 91.9 fL (ref 80.0–100.0)
Monocytes Absolute: 0.6 10*3/uL (ref 0.1–1.0)
Monocytes Relative: 10 %
Neutro Abs: 3.9 10*3/uL (ref 1.7–7.7)
Neutrophils Relative %: 69 %
Platelet Count: 106 10*3/uL — ABNORMAL LOW (ref 150–400)
RBC: 3.58 MIL/uL — ABNORMAL LOW (ref 4.22–5.81)
RDW: 13.3 % (ref 11.5–15.5)
WBC Count: 5.7 10*3/uL (ref 4.0–10.5)
nRBC: 0 % (ref 0.0–0.2)

## 2022-06-30 LAB — CMP (CANCER CENTER ONLY)
ALT: 8 U/L (ref 0–44)
AST: 21 U/L (ref 15–41)
Albumin: 4.7 g/dL (ref 3.5–5.0)
Alkaline Phosphatase: 37 U/L — ABNORMAL LOW (ref 38–126)
Anion gap: 10 (ref 5–15)
BUN: 19 mg/dL (ref 8–23)
CO2: 26 mmol/L (ref 22–32)
Calcium: 9.7 mg/dL (ref 8.9–10.3)
Chloride: 104 mmol/L (ref 98–111)
Creatinine: 1.46 mg/dL — ABNORMAL HIGH (ref 0.61–1.24)
GFR, Estimated: 51 mL/min — ABNORMAL LOW (ref >60)
Glucose, Bld: 102 mg/dL — ABNORMAL HIGH (ref 70–99)
Potassium: 3.9 mmol/L (ref 3.5–5.1)
Sodium: 140 mmol/L (ref 135–145)
Total Bilirubin: 0.6 mg/dL (ref 0.3–1.2)
Total Protein: 7.5 g/dL (ref 6.5–8.1)

## 2022-06-30 NOTE — Progress Notes (Signed)
Patient does not Nplate today based on his blood test results.

## 2022-07-07 DIAGNOSIS — L819 Disorder of pigmentation, unspecified: Secondary | ICD-10-CM | POA: Diagnosis not present

## 2022-07-07 DIAGNOSIS — L218 Other seborrheic dermatitis: Secondary | ICD-10-CM | POA: Diagnosis not present

## 2022-07-07 DIAGNOSIS — L738 Other specified follicular disorders: Secondary | ICD-10-CM | POA: Diagnosis not present

## 2022-07-13 ENCOUNTER — Ambulatory Visit (HOSPITAL_COMMUNITY)
Admission: RE | Admit: 2022-07-13 | Discharge: 2022-07-13 | Disposition: A | Discharge status: Home / Self Care | Payer: Medicare Other | Source: Ambulatory Visit | Attending: Vascular Surgery | Admitting: Vascular Surgery

## 2022-07-13 DIAGNOSIS — I7143 Infrarenal abdominal aortic aneurysm, without rupture: Secondary | ICD-10-CM | POA: Diagnosis not present

## 2022-07-13 DIAGNOSIS — N281 Cyst of kidney, acquired: Secondary | ICD-10-CM | POA: Diagnosis not present

## 2022-07-13 DIAGNOSIS — K7689 Other specified diseases of liver: Secondary | ICD-10-CM | POA: Diagnosis not present

## 2022-07-13 DIAGNOSIS — I723 Aneurysm of iliac artery: Secondary | ICD-10-CM | POA: Diagnosis not present

## 2022-07-13 MED ORDER — IOHEXOL 350 MG/ML SOLN
100.0000 mL | Freq: Once | INTRAVENOUS | Status: AC | PRN
  Administered 2022-07-13: 100 mL via INTRAVENOUS

## 2022-07-14 ENCOUNTER — Inpatient Hospital Stay: Payer: Medicare Other

## 2022-07-14 ENCOUNTER — Inpatient Hospital Stay (HOSPITAL_BASED_OUTPATIENT_CLINIC_OR_DEPARTMENT_OTHER): Payer: Medicare Other | Admitting: Family

## 2022-07-14 ENCOUNTER — Encounter: Payer: Self-pay | Admitting: Family

## 2022-07-14 VITALS — BP 139/82 | HR 56 | Temp 98.7°F | Resp 18

## 2022-07-14 DIAGNOSIS — D693 Immune thrombocytopenic purpura: Secondary | ICD-10-CM

## 2022-07-14 DIAGNOSIS — Z79899 Other long term (current) drug therapy: Secondary | ICD-10-CM | POA: Diagnosis not present

## 2022-07-14 LAB — CMP (CANCER CENTER ONLY)
ALT: 13 U/L (ref 0–44)
AST: 25 U/L (ref 15–41)
Albumin: 4.5 g/dL (ref 3.5–5.0)
Alkaline Phosphatase: 40 U/L (ref 38–126)
Anion gap: 12 (ref 5–15)
BUN: 23 mg/dL (ref 8–23)
CO2: 23 mmol/L (ref 22–32)
Calcium: 9.1 mg/dL (ref 8.9–10.3)
Chloride: 99 mmol/L (ref 98–111)
Creatinine: 1.56 mg/dL — ABNORMAL HIGH (ref 0.61–1.24)
GFR, Estimated: 47 mL/min — ABNORMAL LOW (ref >60)
Glucose, Bld: 101 mg/dL — ABNORMAL HIGH (ref 70–99)
Potassium: 4 mmol/L (ref 3.5–5.1)
Sodium: 134 mmol/L — ABNORMAL LOW (ref 135–145)
Total Bilirubin: 0.6 mg/dL (ref 0.3–1.2)
Total Protein: 7.8 g/dL (ref 6.5–8.1)

## 2022-07-14 LAB — CBC WITH DIFFERENTIAL (CANCER CENTER ONLY)
Abs Immature Granulocytes: 0.03 10*3/uL (ref 0.00–0.07)
Basophils Absolute: 0 10*3/uL (ref 0.0–0.1)
Basophils Relative: 1 %
Eosinophils Absolute: 0.1 10*3/uL (ref 0.0–0.5)
Eosinophils Relative: 2 %
HCT: 33.9 % — ABNORMAL LOW (ref 39.0–52.0)
Hemoglobin: 11.1 g/dL — ABNORMAL LOW (ref 13.0–17.0)
Immature Granulocytes: 1 %
Lymphocytes Relative: 18 %
Lymphs Abs: 1.1 10*3/uL (ref 0.7–4.0)
MCH: 30.4 pg (ref 26.0–34.0)
MCHC: 32.7 g/dL (ref 30.0–36.0)
MCV: 92.9 fL (ref 80.0–100.0)
Monocytes Absolute: 0.6 10*3/uL (ref 0.1–1.0)
Monocytes Relative: 11 %
Neutro Abs: 4.1 10*3/uL (ref 1.7–7.7)
Neutrophils Relative %: 67 %
Platelet Count: 76 10*3/uL — ABNORMAL LOW (ref 150–400)
RBC: 3.65 MIL/uL — ABNORMAL LOW (ref 4.22–5.81)
RDW: 13.5 % (ref 11.5–15.5)
WBC Count: 6 10*3/uL (ref 4.0–10.5)
nRBC: 0 % (ref 0.0–0.2)

## 2022-07-14 MED ORDER — ROMIPLOSTIM 125 MCG ~~LOC~~ SOLR
95.0000 ug | Freq: Once | SUBCUTANEOUS | Status: AC
  Administered 2022-07-14: 95 ug via SUBCUTANEOUS
  Filled 2022-07-14: qty 0.19

## 2022-07-14 NOTE — Progress Notes (Signed)
Hematology and Oncology Follow Up Visit  Lonnie Hansen [id number] 1950/03/23 72 y.o. 07/14/2022   Principle Diagnosis:  Chronic ITP   Current Therapy:        Nplate to keep platelet count over 100,000   Interim History:  Lonnie Hansen is here today for follow-up and injection. He is doing well and has no complaints at this time.  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  He has not noted any issue with bleeding or abnormal bruising. No petechiae.  No issue with infection. No fever, chills, n/v, cough, rash, dizziness, SOB, chest pain, palpitations, abdominal pain or changes in bowel or bladder habits.  No falls or syncope. He ambulates with his cane for added support.  Appetite is fair and he is doing his best to stay well hydrated.  ECOG Performance Status: 1 - Symptomatic but completely ambulatory  Medications:  Allergies as of 07/14/2022   No Known Allergies      Medication List        Accurate as of July 14, 2022  3:42 PM. If you have any questions, ask your nurse or doctor.          acetaminophen 500 MG tablet Commonly known as: TYLENOL Take 1 tablet (500 mg total) by mouth every 8 (eight) hours as needed for fever (pain).   amLODipine 10 MG tablet Commonly known as: NORVASC Take 10 mg by mouth daily.   aspirin 81 MG chewable tablet Chew 1 tablet (81 mg total) by mouth daily.   atenolol 25 MG tablet Commonly known as: TENORMIN Take 12.5 mg by mouth 2 (two) times daily.   benzonatate 100 MG capsule Commonly known as: TESSALON Take 100 mg by mouth 3 (three) times daily.   fluticasone 50 MCG/ACT nasal spray Commonly known as: FLONASE Place 2 sprays into both nostrils daily as needed for allergies or rhinitis.   latanoprost 0.005 % ophthalmic solution Commonly known as: XALATAN Place 1 drop into both eyes at bedtime.   multivitamin with minerals Tabs tablet Take 1 tablet by mouth daily.   omega-3 acid ethyl esters 1 g  capsule Commonly known as: LOVAZA Take 2 g by mouth 2 (two) times daily.   pantoprazole 40 MG tablet Commonly known as: PROTONIX Take 40 mg by mouth 2 (two) times daily.   polyethylene glycol 17 g packet Commonly known as: MIRALAX / GLYCOLAX Take 17 g by mouth daily as needed for mild constipation. Every other day   psyllium 95 % Pack Commonly known as: HYDROCIL/METAMUCIL Take 1 packet by mouth daily as needed for mild constipation.   rosuvastatin 20 MG tablet Commonly known as: CRESTOR Take 20 mg by mouth daily.   tamsulosin 0.4 MG Caps capsule Commonly known as: FLOMAX Take 0.4 mg by mouth daily.   triamcinolone 0.025 % cream Commonly known as: KENALOG Apply 1 Application topically daily as needed (skin irritation/rash.).        Allergies: No Known Allergies  Past Medical History, Surgical history, Social history, and Family History were reviewed and updated.  Review of Systems: All other 10 point review of systems is negative.   Physical Exam:  oral temperature is 98.7 F (37.1 C). His blood pressure is 139/82 and his pulse is 56 (abnormal). His respiration is 18 and oxygen saturation is 100%.   Wt Readings from Last 3 Encounters:  06/24/22 187 lb 12.8 oz (85.2 kg)  06/16/22 189 lb (85.7 kg)  05/18/22 187 lb 9.6  oz (85.1 kg)    Ocular: Sclerae unicteric, pupils equal, round and reactive to light Ear-nose-throat: Oropharynx clear, dentition fair Lymphatic: No cervical or supraclavicular adenopathy Lungs no rales or rhonchi, good excursion bilaterally Heart regular rate and rhythm, no murmur appreciated Abd soft, nontender, positive bowel sounds MSK no focal spinal tenderness, no joint edema Neuro: non-focal, well-oriented, appropriate affect Breasts: Deferred   Lab Results  Component Value Date   WBC 6.0 07/14/2022   HGB 11.1 (L) 07/14/2022   HCT 33.9 (L) 07/14/2022   MCV 92.9 07/14/2022   PLT 76 (L) 07/14/2022   Lab Results  Component Value Date    FERRITIN 28 10/31/2021   IRON 91 10/31/2021   TIBC 419 10/31/2021   UIBC 328 10/31/2021   IRONPCTSAT 22 10/31/2021   Lab Results  Component Value Date   RETICCTPCT 2.1 10/31/2021   RBC 3.65 (L) 07/14/2022   No results found for: "KPAFRELGTCHN", "LAMBDASER", "KAPLAMBRATIO" No results found for: "IGGSERUM", "IGA", "IGMSERUM" No results found for: "TOTALPROTELP", "ALBUMINELP", "A1GS", "A2GS", "BETS", "BETA2SER", "GAMS", "MSPIKE", "SPEI"   Chemistry      Component Value Date/Time   NA 134 (L) 07/14/2022 1413   NA 140 05/11/2019 0946   K 4.0 07/14/2022 1413   CL 99 07/14/2022 1413   CO2 23 07/14/2022 1413   BUN 23 07/14/2022 1413   BUN 17 05/11/2019 0946   CREATININE 1.56 (H) 07/14/2022 1413   CREATININE 0.75 04/10/2016 1132      Component Value Date/Time   CALCIUM 9.1 07/14/2022 1413   ALKPHOS 40 07/14/2022 1413   AST 25 07/14/2022 1413   ALT 13 07/14/2022 1413   BILITOT 0.6 07/14/2022 1413       Impression and Plan: Lonnie Hansen is a very pleasant 72 yo African American gentleman with chronic ITP.    Platelets are 76 and he remains asymptomatic at this time.  We will proceed with Nplate injection.  We will have him come back every 2 weeks for injection and follow-up in 8 weeks.   Eileen Stanford, NP 6/18/20243:42 PM

## 2022-07-14 NOTE — Patient Instructions (Signed)
Romiplostim Injection What is this medication? ROMIPLOSTIM (roe mi PLOE stim) treats low levels of platelets in your body caused by immune thrombocytopenia (ITP). It is prescribed when other medications have not worked or cannot be tolerated. It may also be used to help people who have been exposed to high doses of radiation. It works by increasing the amount of platelets in your blood. This lowers the risk of bleeding. This medicine may be used for other purposes; ask your health care provider or pharmacist if you have questions. COMMON BRAND NAME(S): Nplate What should I tell my care team before I take this medication? They need to know if you have any of these conditions: Blood clots Myelodysplastic syndrome An unusual or allergic reaction to romiplostim, mannitol, other medications, foods, dyes, or preservatives Pregnant or trying to get pregnant Breast-feeding How should I use this medication? This medication is injected under the skin. It is given by a care team in a hospital or clinic setting. A special MedGuide will be given to you before each treatment. Be sure to read this information carefully each time. Talk to your care team about the use of this medication in children. While it may be prescribed for children as young as newborns for selected conditions, precautions do apply. Overdosage: If you think you have taken too much of this medicine contact a poison control center or emergency room at once. NOTE: This medicine is only for you. Do not share this medicine with others. What if I miss a dose? Keep appointments for follow-up doses. It is important not to miss your dose. Call your care team if you are unable to keep an appointment. What may interact with this medication? Interactions are not expected. This list may not describe all possible interactions. Give your health care provider a list of all the medicines, herbs, non-prescription drugs, or dietary supplements you use. Also  tell them if you smoke, drink alcohol, or use illegal drugs. Some items may interact with your medicine. What should I watch for while using this medication? Visit your care team for regular checks on your progress. You may need blood work done while you are taking this medication. Your condition will be monitored carefully while you are receiving this medication. It is important not to miss any appointments. What side effects may I notice from receiving this medication? Side effects that you should report to your care team as soon as possible: Allergic reactions--skin rash, itching, hives, swelling of the face, lips, tongue, or throat Blood clot--pain, swelling, or warmth in the leg, shortness of breath, chest pain Side effects that usually do not require medical attention (report to your care team if they continue or are bothersome): Dizziness Joint pain Muscle pain Pain in the hands or feet Stomach pain Trouble sleeping This list may not describe all possible side effects. Call your doctor for medical advice about side effects. You may report side effects to FDA at 1-800-FDA-1088. Where should I keep my medication? This medication is given in a hospital or clinic. It will not be stored at home. NOTE: This sheet is a summary. It may not cover all possible information. If you have questions about this medicine, talk to your doctor, pharmacist, or health care provider.  2024 Elsevier/Gold Standard (2021-05-19 00:00:00)  

## 2022-07-22 ENCOUNTER — Encounter: Payer: Self-pay | Admitting: Vascular Surgery

## 2022-07-22 ENCOUNTER — Ambulatory Visit (INDEPENDENT_AMBULATORY_CARE_PROVIDER_SITE_OTHER): Payer: Medicare Other | Admitting: Vascular Surgery

## 2022-07-22 VITALS — BP 137/76 | HR 53 | Temp 97.9°F | Resp 20 | Ht 68.0 in | Wt 190.0 lb

## 2022-07-22 DIAGNOSIS — I723 Aneurysm of iliac artery: Secondary | ICD-10-CM

## 2022-07-22 NOTE — Progress Notes (Signed)
Patient ID: Lonnie Hansen, male   DOB: 04-04-1950, 72 y.o.   MRN: [id number]  Reason for Consult: Follow-up   Referred by [name], MD  Subjective:     HPI:  Lonnie Hansen is a 72 y.o. male history of aneurysm repair with coil ablation of the right hypogastric.  He has a known left hypogastric artery aneurysm.  Denies any new abdominal pain.  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  Patient has lost significant weight intentionally remains active with plans to play keyboard at the [facility] in [place], [place] tonight.  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 states  it's a" growth on vocal cord"   Family History  Problem Relation Age of Onset   Lymphoma Mother    Heart disease Father    Heart attack Father    Thyroid disease Sister    Past Surgical History:  Procedure Laterality Date   ABDOMINAL AORTIC ENDOVASCULAR STENT GRAFT N/A 11/28/2021   Procedure: ABDOMINAL AORTIC ENDOVASCULAR STENT GRAFT;  Surgeon: Maeola Harman, MD;  [facility];  Service: Vascular;  Laterality: N/A;   ANEURYSM COILING Right 11/28/2021   Procedure: ANEURYSM COILING RIGHT INTERNAL ILIAC;   Surgeon: Maeola Harman, MD;  Location: [facility] OR;  Service: Vascular;  Laterality: Right;   AORTIC ARCH ANGIOGRAPHY N/A 04/16/2016   Procedure: Aortic Arch Angiography;  Surgeon: [name] Swaziland, MD;  Location: [facility] INVASIVE CV LAB;  Service: Cardiovascular;  Laterality: N/A;   AORTIC VALVE REPLACEMENT N/A 09/15/2016   Procedure: AORTIC VALVE REPLACEMENT (AVR);  Surgeon: Delight Ovens, MD;  Location: [facility] OR;  Service: Open Heart Surgery;  Laterality: N/A;  Using 29mm Edwards Perimount Magna Ease Aortic Bioprosthesis Valve   ASCENDING AORTIC ROOT REPLACEMENT N/A 09/15/2016   Procedure: ASCENDING AORTIC ROOT REPLACEMENT;  Surgeon: Delight Ovens, MD;  Location: [facility] OR;  Service: Open Heart Surgery;  Laterality: N/A;  Using 32mm Gelweave Valsalva Graft   CHOLECYSTECTOMY N/A 12/19/2020   Procedure: LAPAROSCOPIC CHOLECYSTECTOMY WITH POSSIBLE INTRAOPERATIVE CHOLANGIOGRAM;  Surgeon: Gaynelle Adu, MD;  Location: [facility] OR;  Service: General;  Laterality: N/A;   COLONOSCOPY     COLONOSCOPY Left 04/16/2019   Procedure: COLONOSCOPY;  Surgeon: Willis Modena, MD;  Location: [facility] ENDOSCOPY;  Service: Endoscopy;  Laterality: Left;   COLONOSCOPY N/A 06/01/2020   Procedure: COLONOSCOPY;  Surgeon: Kerin Salen, MD;  Location: [facility] ENDOSCOPY;  Service: Gastroenterology;  Laterality: N/A;   COLONOSCOPY WITH ESOPHAGOGASTRODUODENOSCOPY (EGD)     COLONOSCOPY WITH PROPOFOL N/A 05/14/2021  Procedure: COLONOSCOPY WITH PROPOFOL;  Surgeon: Kerin Salen, MD;  Location: [facility] ENDOSCOPY;  Service: Gastroenterology;  Laterality: N/A;   ERCP N/A 08/19/2021   Procedure: ENDOSCOPIC RETROGRADE CHOLANGIOPANCREATOGRAPHY (ERCP);  Surgeon: Vida Rigger, MD;  Location: [facility] ENDOSCOPY;  Service: Gastroenterology;  Laterality: N/A;  at 1:30 pm   ESOPHAGOGASTRODUODENOSCOPY N/A 05/25/2020   Procedure: ESOPHAGOGASTRODUODENOSCOPY (EGD);  Surgeon: Charlott Rakes, MD;  Location: [facility] ENDOSCOPY;  Service: Endoscopy;  Laterality: N/A;    ESOPHAGOGASTRODUODENOSCOPY N/A 08/19/2021   Procedure: ESOPHAGOGASTRODUODENOSCOPY (EGD);  Surgeon: Kathi Der, MD;  Location: [facility] ENDOSCOPY;  Service: Gastroenterology;  Laterality: N/A;   ESOPHAGOGASTRODUODENOSCOPY (EGD) WITH PROPOFOL Left 04/16/2019   Procedure: ESOPHAGOGASTRODUODENOSCOPY (EGD) WITH PROPOFOL;  Surgeon: Willis Modena, MD;  Location: [facility] ENDOSCOPY;  Service: Endoscopy;  Laterality: Left;   ESOPHAGOGASTRODUODENOSCOPY (EGD) WITH PROPOFOL N/A 08/23/2021   Procedure: ESOPHAGOGASTRODUODENOSCOPY (EGD) WITH PROPOFOL;  Surgeon: Kerin Salen, MD;  Location: [facility] ENDOSCOPY;  Service: Gastroenterology;  Laterality: N/A;   FLEXIBLE SIGMOIDOSCOPY N/A 05/25/2020   Procedure: FLEXIBLE SIGMOIDOSCOPY;  Surgeon: Charlott Rakes, MD;  Location: [facility] ENDOSCOPY;  Service: Endoscopy;  Laterality: N/A;   HEMOSTASIS CONTROL  08/19/2021   Procedure: HEMOSTASIS CONTROL;  Surgeon: Kathi Der, MD;  Location: [facility] ENDOSCOPY;  Service: Gastroenterology;;   IR ANGIOGRAM SELECTIVE EACH ADDITIONAL VESSEL  05/26/2021   IR ANGIOGRAM SELECTIVE EACH ADDITIONAL VESSEL  08/20/2021   IR ANGIOGRAM SELECTIVE EACH ADDITIONAL VESSEL  08/20/2021   IR ANGIOGRAM VISCERAL SELECTIVE  05/26/2021   IR ANGIOGRAM VISCERAL SELECTIVE  08/20/2021   IR EMBO ART  VEN HEMORR LYMPH EXTRAV  INC GUIDE ROADMAPPING  05/26/2021   IR EMBO ART  VEN HEMORR LYMPH EXTRAV  INC GUIDE ROADMAPPING  08/20/2021   IR FLUORO GUIDE CV LINE RIGHT  08/20/2021   IR THORACENTESIS ASP PLEURAL SPACE W/IMG GUIDE  10/23/2016   IR US GUIDE VASC ACCESS LEFT  05/26/2021   IR US GUIDE VASC ACCESS RIGHT  05/26/2021   IR US GUIDE VASC ACCESS RIGHT  08/20/2021   IR US GUIDE VASC ACCESS RIGHT  08/20/2021   MICROLARYNGOSCOPY Right 09/20/2017   Procedure: MICROLARYNGOSCOPY WITH  EXCISION OF VOCAL CORD LESION;  Surgeon: Newman Pies, MD;  Location: [facility];  Service: ENT;  Laterality: Right;   MULTIPLE EXTRACTIONS WITH ALVEOLOPLASTY N/A 06/10/2016   Procedure: Extraction  of tooth #'s 2,8,13,15, and 29  with alveoloplasty, maxillary right and left buccal exostoses reductions, and gross debridement of remaining teeth.;  Surgeon: Charlynne Pander, DDS;  Location: [facility] OR;  Service: Oral Surgery;  Laterality: N/A;   POLYPECTOMY  05/14/2021   Procedure: POLYPECTOMY;  Surgeon: Kerin Salen, MD;  Location: [facility] ENDOSCOPY;  Service: Gastroenterology;;   REMOVAL OF STONES  08/19/2021   Procedure: REMOVAL OF STONES;  Surgeon: Vida Rigger, MD;  Location: [facility] ENDOSCOPY;  Service: Gastroenterology;;   RIGHT/LEFT HEART CATH AND CORONARY ANGIOGRAPHY N/A 04/16/2016   Procedure: Right/Left Heart Cath and Coronary Angiography;  Surgeon: [name] Swaziland, MD;  Location: [facility] INVASIVE CV LAB;  Service: Cardiovascular;  Laterality: N/A;   SPHINCTEROTOMY  08/19/2021   Procedure: SPHINCTEROTOMY;  Surgeon: Vida Rigger, MD;  Location: [facility] ENDOSCOPY;  Service: Gastroenterology;;   TEE WITHOUT CARDIOVERSION N/A 09/15/2016   Procedure: TRANSESOPHAGEAL ECHOCARDIOGRAM (TEE);  Surgeon: Delight Ovens, MD;  Location: [facility] OR;  Service: Open Heart Surgery;  Laterality: N/A;    Short Social History:  Social History   Tobacco Use   Smoking status: Former    Packs/day: 1.00    Years: 25.00  Additional pack years: 0.00    Total pack years: 25.00    Types: Cigarettes   Smokeless tobacco: Never  Substance Use Topics   Alcohol use: No    Alcohol/week: 0.0 standard drinks of alcohol    No Known Allergies  Current Outpatient Medications  Medication Sig Dispense Refill   acetaminophen (TYLENOL) 500 MG tablet Take 1 tablet (500 mg total) by mouth every 8 (eight) hours as needed for fever (pain).     amLODipine (NORVASC) 10 MG tablet Take 10 mg by mouth daily.     aspirin 81 MG chewable tablet Chew 1 tablet (81 mg total) by mouth daily.     atenolol (TENORMIN) 25 MG tablet Take 12.5 mg by mouth 2 (two) times daily. 180 tablet 3   fluticasone (FLONASE) 50 MCG/ACT nasal spray Place 2 sprays into both  nostrils daily as needed for allergies or rhinitis.     latanoprost (XALATAN) 0.005 % ophthalmic solution Place 1 drop into both eyes at bedtime.     Multiple Vitamin (MULTIVITAMIN WITH MINERALS) TABS tablet Take 1 tablet by mouth daily.     omega-3 acid ethyl esters (LOVAZA) 1 g capsule Take 2 g by mouth 2 (two) times daily.     pantoprazole (PROTONIX) 40 MG tablet Take 40 mg by mouth 2 (two) times daily.     polyethylene glycol (MIRALAX / GLYCOLAX) 17 g packet Take 17 g by mouth daily as needed for mild constipation. Every other day     psyllium (HYDROCIL/METAMUCIL) 95 % PACK Take 1 packet by mouth daily as needed for mild constipation.     rosuvastatin (CRESTOR) 20 MG tablet Take 20 mg by mouth daily.     tamsulosin (FLOMAX) 0.4 MG CAPS capsule Take 0.4 mg by mouth daily.     triamcinolone (KENALOG) 0.025 % cream Apply 1 Application topically daily as needed (skin irritation/rash.).     No current facility-administered medications for this visit.    Review of Systems  Constitutional:  Constitutional negative. HENT: HENT negative.  Eyes: Eyes negative.  Respiratory: Respiratory negative.  Cardiovascular: Cardiovascular negative.  GI: Gastrointestinal negative.  Musculoskeletal: Positive for back pain.  Neurological: Neurological negative. Hematologic: Hematologic/lymphatic negative.  Psychiatric: Psychiatric negative.        Objective:  Objective   Vitals:   07/22/22 1108  BP: 137/76  Pulse: (!) 53  Resp: 20  Temp: 97.9 F (36.6 C)  SpO2: 98%  Weight: 190 lb (86.2 kg)  Height: 5\' 8"  (1.727 m)   Body mass index is 28.89 kg/m.  Physical Exam HENT:     Head: Normocephalic.     Nose: Nose normal.  Eyes:     Pupils: Pupils are equal, round, and reactive to light.  Cardiovascular:     Pulses:          Femoral pulses are 2+ on the right side and 2+ on the left side.      Popliteal pulses are 3+ on the right side and 3+ on the left side.  Pulmonary:     Effort:  Pulmonary effort is normal.  Abdominal:     General: Abdomen is flat.     Palpations: Abdomen is soft.  Musculoskeletal:     Right lower leg: No edema.     Left lower leg: Edema present.  Skin:    General: Skin is warm.     Capillary Refill: Capillary refill takes less than 2 seconds.  Neurological:     General: No focal deficit  present.     Mental Status: He is alert.  Psychiatric:        Mood and Affect: Mood normal.        Thought Content: Thought content normal.        Judgment: Judgment normal.     Data: IMPRESSION: VASCULAR   1. Similar appearing postsurgical changes after aorto bi-iliac endograft repair of infrarenal abdominal aortic aneurysm and embolization of the right internal iliac artery. No evidence of aneurysm sac enlargement or endoleak. 2. Similar appearing saccular aneurysm arising from the proximal anterior division of the left internal iliac artery measuring up to 4.7 cm.   NON-VASCULAR   1. Diverticulosis. 2. Similar appearing simple renal cysts.     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  I discussed proceeding with repair of the left internal iliac artery aneurysm which would likely be done with stenting given that the right side has been coiled and his IMA is occluded with previous infrarenal graft.  Patient does demonstrate good understanding at this time needs time to think about any further intervention and as such I will have him follow-up in a few months to discuss surgical intervention as noted above.  All questions were answered.  We did discuss the signs and symptoms of rupture and he demonstrates understanding.     Maeola Harman MD [facility]

## 2022-07-28 ENCOUNTER — Inpatient Hospital Stay: Payer: Medicare Other | Attending: Hematology & Oncology

## 2022-07-28 ENCOUNTER — Telehealth: Payer: Self-pay | Admitting: Oncology

## 2022-07-28 ENCOUNTER — Inpatient Hospital Stay: Payer: Medicare Other

## 2022-07-28 DIAGNOSIS — D693 Immune thrombocytopenic purpura: Secondary | ICD-10-CM | POA: Diagnosis not present

## 2022-07-28 DIAGNOSIS — Z79899 Other long term (current) drug therapy: Secondary | ICD-10-CM | POA: Insufficient documentation

## 2022-07-28 LAB — CMP (CANCER CENTER ONLY)
ALT: 13 U/L (ref 0–44)
AST: 26 U/L (ref 15–41)
Albumin: 4.5 g/dL (ref 3.5–5.0)
Alkaline Phosphatase: 41 U/L (ref 38–126)
Anion gap: 10 (ref 5–15)
BUN: 20 mg/dL (ref 8–23)
CO2: 24 mmol/L (ref 22–32)
Calcium: 9 mg/dL (ref 8.9–10.3)
Chloride: 104 mmol/L (ref 98–111)
Creatinine: 1.51 mg/dL — ABNORMAL HIGH (ref 0.61–1.24)
GFR, Estimated: 49 mL/min — ABNORMAL LOW (ref >60)
Glucose, Bld: 111 mg/dL — ABNORMAL HIGH (ref 70–99)
Potassium: 4.5 mmol/L (ref 3.5–5.1)
Sodium: 138 mmol/L (ref 135–145)
Total Bilirubin: 0.6 mg/dL (ref 0.3–1.2)
Total Protein: 7.7 g/dL (ref 6.5–8.1)

## 2022-07-28 LAB — CBC WITH DIFFERENTIAL (CANCER CENTER ONLY)
Abs Immature Granulocytes: 0.03 10*3/uL (ref 0.00–0.07)
Basophils Absolute: 0 10*3/uL (ref 0.0–0.1)
Basophils Relative: 0 %
Eosinophils Absolute: 0.2 10*3/uL (ref 0.0–0.5)
Eosinophils Relative: 3 %
HCT: 33.8 % — ABNORMAL LOW (ref 39.0–52.0)
Hemoglobin: 10.9 g/dL — ABNORMAL LOW (ref 13.0–17.0)
Immature Granulocytes: 1 %
Lymphocytes Relative: 18 %
Lymphs Abs: 1.1 10*3/uL (ref 0.7–4.0)
MCH: 30.5 pg (ref 26.0–34.0)
MCHC: 32.2 g/dL (ref 30.0–36.0)
MCV: 94.7 fL (ref 80.0–100.0)
Monocytes Absolute: 0.7 10*3/uL (ref 0.1–1.0)
Monocytes Relative: 11 %
Neutro Abs: 4 10*3/uL (ref 1.7–7.7)
Neutrophils Relative %: 67 %
Platelet Count: 126 10*3/uL — ABNORMAL LOW (ref 150–400)
RBC: 3.57 MIL/uL — ABNORMAL LOW (ref 4.22–5.81)
RDW: 13.6 % (ref 11.5–15.5)
WBC Count: 6.1 10*3/uL (ref 4.0–10.5)
nRBC: 0 % (ref 0.0–0.2)

## 2022-07-28 NOTE — Progress Notes (Signed)
Pt's platlets noted at 126. No treatment required at this time. Lab results given to patient  Pt instructed to follow schedule and call center for issues that occur

## 2022-08-11 ENCOUNTER — Inpatient Hospital Stay: Payer: Medicare Other

## 2022-08-11 VITALS — BP 142/79 | HR 57 | Temp 97.8°F | Resp 17

## 2022-08-11 DIAGNOSIS — Z79899 Other long term (current) drug therapy: Secondary | ICD-10-CM | POA: Diagnosis not present

## 2022-08-11 DIAGNOSIS — D693 Immune thrombocytopenic purpura: Secondary | ICD-10-CM

## 2022-08-11 LAB — CMP (CANCER CENTER ONLY)
ALT: 7 U/L (ref 0–44)
AST: 16 U/L (ref 15–41)
Albumin: 4.7 g/dL (ref 3.5–5.0)
Alkaline Phosphatase: 43 U/L (ref 38–126)
Anion gap: 10 (ref 5–15)
BUN: 20 mg/dL (ref 8–23)
CO2: 28 mmol/L (ref 22–32)
Calcium: 9.7 mg/dL (ref 8.9–10.3)
Chloride: 100 mmol/L (ref 98–111)
Creatinine: 1.49 mg/dL — ABNORMAL HIGH (ref 0.61–1.24)
GFR, Estimated: 50 mL/min — ABNORMAL LOW (ref >60)
Glucose, Bld: 112 mg/dL — ABNORMAL HIGH (ref 70–99)
Potassium: 3.8 mmol/L (ref 3.5–5.1)
Sodium: 138 mmol/L (ref 135–145)
Total Bilirubin: 0.8 mg/dL (ref 0.3–1.2)
Total Protein: 7.7 g/dL (ref 6.5–8.1)

## 2022-08-11 LAB — CBC WITH DIFFERENTIAL (CANCER CENTER ONLY)
Abs Immature Granulocytes: 0.03 10*3/uL (ref 0.00–0.07)
Basophils Absolute: 0 10*3/uL (ref 0.0–0.1)
Basophils Relative: 0 %
Eosinophils Absolute: 0.1 10*3/uL (ref 0.0–0.5)
Eosinophils Relative: 2 %
HCT: 33.2 % — ABNORMAL LOW (ref 39.0–52.0)
Hemoglobin: 11.1 g/dL — ABNORMAL LOW (ref 13.0–17.0)
Immature Granulocytes: 0 %
Lymphocytes Relative: 16 %
Lymphs Abs: 1.1 10*3/uL (ref 0.7–4.0)
MCH: 31.1 pg (ref 26.0–34.0)
MCHC: 33.4 g/dL (ref 30.0–36.0)
MCV: 93 fL (ref 80.0–100.0)
Monocytes Absolute: 0.7 10*3/uL (ref 0.1–1.0)
Monocytes Relative: 10 %
Neutro Abs: 4.9 10*3/uL (ref 1.7–7.7)
Neutrophils Relative %: 72 %
Platelet Count: 71 10*3/uL — ABNORMAL LOW (ref 150–400)
RBC: 3.57 MIL/uL — ABNORMAL LOW (ref 4.22–5.81)
RDW: 13.5 % (ref 11.5–15.5)
WBC Count: 6.9 10*3/uL (ref 4.0–10.5)
nRBC: 0 % (ref 0.0–0.2)

## 2022-08-11 MED ORDER — ROMIPLOSTIM 125 MCG ~~LOC~~ SOLR
95.0000 ug | Freq: Once | SUBCUTANEOUS | Status: AC
  Administered 2022-08-11: 95 ug via SUBCUTANEOUS
  Filled 2022-08-11: qty 0.19

## 2022-08-11 NOTE — Patient Instructions (Signed)
Romiplostim Injection What is this medication? ROMIPLOSTIM (roe mi PLOE stim) treats low levels of platelets in your body caused by immune thrombocytopenia (ITP). It is prescribed when other medications have not worked or cannot be tolerated. It may also be used to help people who have been exposed to high doses of radiation. It works by increasing the amount of platelets in your blood. This lowers the risk of bleeding. This medicine may be used for other purposes; ask your health care provider or pharmacist if you have questions. COMMON BRAND NAME(S): Nplate What should I tell my care team before I take this medication? They need to know if you have any of these conditions: Blood clots Myelodysplastic syndrome An unusual or allergic reaction to romiplostim, mannitol, other medications, foods, dyes, or preservatives Pregnant or trying to get pregnant Breast-feeding How should I use this medication? This medication is injected under the skin. It is given by a care team in a hospital or clinic setting. A special MedGuide will be given to you before each treatment. Be sure to read this information carefully each time. Talk to your care team about the use of this medication in children. While it may be prescribed for children as young as newborns for selected conditions, precautions do apply. Overdosage: If you think you have taken too much of this medicine contact a poison control center or emergency room at once. NOTE: This medicine is only for you. Do not share this medicine with others. What if I miss a dose? Keep appointments for follow-up doses. It is important not to miss your dose. Call your care team if you are unable to keep an appointment. What may interact with this medication? Interactions are not expected. This list may not describe all possible interactions. Give your health care provider a list of all the medicines, herbs, non-prescription drugs, or dietary supplements you use. Also  tell them if you smoke, drink alcohol, or use illegal drugs. Some items may interact with your medicine. What should I watch for while using this medication? Visit your care team for regular checks on your progress. You may need blood work done while you are taking this medication. Your condition will be monitored carefully while you are receiving this medication. It is important not to miss any appointments. What side effects may I notice from receiving this medication? Side effects that you should report to your care team as soon as possible: Allergic reactions--skin rash, itching, hives, swelling of the face, lips, tongue, or throat Blood clot--pain, swelling, or warmth in the leg, shortness of breath, chest pain Side effects that usually do not require medical attention (report to your care team if they continue or are bothersome): Dizziness Joint pain Muscle pain Pain in the hands or feet Stomach pain Trouble sleeping This list may not describe all possible side effects. Call your doctor for medical advice about side effects. You may report side effects to FDA at 1-800-FDA-1088. Where should I keep my medication? This medication is given in a hospital or clinic. It will not be stored at home. NOTE: This sheet is a summary. It may not cover all possible information. If you have questions about this medicine, talk to your doctor, pharmacist, or health care provider.  2024 Elsevier/Gold Standard (2021-05-19 00:00:00)  

## 2022-08-21 ENCOUNTER — Emergency Department (HOSPITAL_BASED_OUTPATIENT_CLINIC_OR_DEPARTMENT_OTHER)
Admission: EM | Admit: 2022-08-21 | Discharge: 2022-08-21 | Disposition: A | Discharge status: Home / Self Care | Payer: Medicare Other | Attending: Emergency Medicine | Admitting: Emergency Medicine

## 2022-08-21 ENCOUNTER — Other Ambulatory Visit: Payer: Self-pay

## 2022-08-21 ENCOUNTER — Emergency Department (HOSPITAL_BASED_OUTPATIENT_CLINIC_OR_DEPARTMENT_OTHER): Payer: Medicare Other

## 2022-08-21 DIAGNOSIS — I2510 Atherosclerotic heart disease of native coronary artery without angina pectoris: Secondary | ICD-10-CM | POA: Insufficient documentation

## 2022-08-21 DIAGNOSIS — I10 Essential (primary) hypertension: Secondary | ICD-10-CM | POA: Diagnosis not present

## 2022-08-21 DIAGNOSIS — I723 Aneurysm of iliac artery: Secondary | ICD-10-CM | POA: Diagnosis not present

## 2022-08-21 DIAGNOSIS — K644 Residual hemorrhoidal skin tags: Secondary | ICD-10-CM | POA: Insufficient documentation

## 2022-08-21 DIAGNOSIS — K625 Hemorrhage of anus and rectum: Secondary | ICD-10-CM | POA: Insufficient documentation

## 2022-08-21 DIAGNOSIS — Z7982 Long term (current) use of aspirin: Secondary | ICD-10-CM | POA: Diagnosis not present

## 2022-08-21 DIAGNOSIS — I714 Abdominal aortic aneurysm, without rupture, unspecified: Secondary | ICD-10-CM | POA: Diagnosis not present

## 2022-08-21 DIAGNOSIS — Z87891 Personal history of nicotine dependence: Secondary | ICD-10-CM | POA: Diagnosis not present

## 2022-08-21 DIAGNOSIS — Z79899 Other long term (current) drug therapy: Secondary | ICD-10-CM | POA: Insufficient documentation

## 2022-08-21 DIAGNOSIS — K5730 Diverticulosis of large intestine without perforation or abscess without bleeding: Secondary | ICD-10-CM | POA: Diagnosis not present

## 2022-08-21 DIAGNOSIS — K5790 Diverticulosis of intestine, part unspecified, without perforation or abscess without bleeding: Secondary | ICD-10-CM | POA: Insufficient documentation

## 2022-08-21 DIAGNOSIS — Z87442 Personal history of urinary calculi: Secondary | ICD-10-CM | POA: Insufficient documentation

## 2022-08-21 LAB — COMPREHENSIVE METABOLIC PANEL
ALT: 9 U/L (ref 0–44)
AST: 21 U/L (ref 15–41)
Albumin: 4.7 g/dL (ref 3.5–5.0)
Alkaline Phosphatase: 40 U/L (ref 38–126)
Anion gap: 11 (ref 5–15)
BUN: 20 mg/dL (ref 8–23)
CO2: 25 mmol/L (ref 22–32)
Calcium: 9.7 mg/dL (ref 8.9–10.3)
Chloride: 104 mmol/L (ref 98–111)
Creatinine, Ser: 1.35 mg/dL — ABNORMAL HIGH (ref 0.61–1.24)
GFR, Estimated: 56 mL/min — ABNORMAL LOW (ref >60)
Glucose, Bld: 108 mg/dL — ABNORMAL HIGH (ref 70–99)
Potassium: 4 mmol/L (ref 3.5–5.1)
Sodium: 140 mmol/L (ref 135–145)
Total Bilirubin: 0.7 mg/dL (ref 0.3–1.2)
Total Protein: 7.8 g/dL (ref 6.5–8.1)

## 2022-08-21 LAB — CBC WITH DIFFERENTIAL/PLATELET
Abs Immature Granulocytes: 0.05 10*3/uL (ref 0.00–0.07)
Basophils Absolute: 0 10*3/uL (ref 0.0–0.1)
Basophils Relative: 0 %
Eosinophils Absolute: 0.1 10*3/uL (ref 0.0–0.5)
Eosinophils Relative: 1 %
HCT: 31.9 % — ABNORMAL LOW (ref 39.0–52.0)
Hemoglobin: 10.9 g/dL — ABNORMAL LOW (ref 13.0–17.0)
Immature Granulocytes: 1 %
Lymphocytes Relative: 9 %
Lymphs Abs: 0.8 10*3/uL (ref 0.7–4.0)
MCH: 31.3 pg (ref 26.0–34.0)
MCHC: 34.2 g/dL (ref 30.0–36.0)
MCV: 91.7 fL (ref 80.0–100.0)
Monocytes Absolute: 0.6 10*3/uL (ref 0.1–1.0)
Monocytes Relative: 7 %
Neutro Abs: 7.2 10*3/uL (ref 1.7–7.7)
Neutrophils Relative %: 82 %
Platelets: 105 10*3/uL — ABNORMAL LOW (ref 150–400)
RBC: 3.48 MIL/uL — ABNORMAL LOW (ref 4.22–5.81)
RDW: 13.6 % (ref 11.5–15.5)
WBC: 8.7 10*3/uL (ref 4.0–10.5)
nRBC: 0 % (ref 0.0–0.2)

## 2022-08-21 LAB — URINALYSIS, ROUTINE W REFLEX MICROSCOPIC
Bacteria, UA: NONE SEEN
Bilirubin Urine: NEGATIVE
Glucose, UA: NEGATIVE mg/dL
Ketones, ur: NEGATIVE mg/dL
Leukocytes,Ua: NEGATIVE
Nitrite: NEGATIVE
Protein, ur: NEGATIVE mg/dL
Specific Gravity, Urine: 1.008 (ref 1.005–1.030)
pH: 5 (ref 5.0–8.0)

## 2022-08-21 LAB — PROTIME-INR
INR: 1.2 (ref 0.8–1.2)
Prothrombin Time: 15 seconds (ref 11.4–15.2)

## 2022-08-21 LAB — OCCULT BLOOD X 1 CARD TO LAB, STOOL: Fecal Occult Bld: POSITIVE — AB

## 2022-08-21 LAB — LIPASE, BLOOD: Lipase: 33 U/L (ref 11–51)

## 2022-08-21 MED ORDER — IOHEXOL 300 MG/ML  SOLN
100.0000 mL | Freq: Once | INTRAMUSCULAR | Status: AC | PRN
  Administered 2022-08-21: 100 mL via INTRAVENOUS

## 2022-08-21 MED ORDER — PANTOPRAZOLE SODIUM 40 MG IV SOLR
40.0000 mg | Freq: Once | INTRAVENOUS | Status: AC
  Administered 2022-08-21: 40 mg via INTRAVENOUS
  Filled 2022-08-21: qty 10

## 2022-08-21 MED ORDER — SUCRALFATE 1 G PO TABS
1.0000 g | ORAL_TABLET | Freq: Three times a day (TID) | ORAL | 0 refills | Status: DC
Start: 2022-08-21 — End: 2022-09-22

## 2022-08-21 NOTE — ED Provider Notes (Signed)
[place] EMERGENCY DEPARTMENT AT [facility] Provider Note  CSN: [id number] Arrival date & time: 08/21/22 1547  Chief Complaint(s) Rectal Bleeding  HPI Lonnie Hansen is a 72 y.o. male with past medical history as below, significant for AAA, ITP, CAD, GERD, HLD, HTN, diverticulitis, external hemorrhoids who presents to the ED with complaint of rectal bleeding.  Patient reports earlier this morning he had a bowel movement that was black, tarry.  No pain with defecation at time.  No abdominal pain at time.  He has another bowel movement later on this morning and noticed bright red blood on the toilet paper when wiping.  He called gastroenterology office and advised him to come to the ER for evaluation.  History of prior diverticulitis with bleeding.  Does report he has been using Goody's powder frequently over the past few days secondary to a headache.  No frequent alcohol use, no frequent tobacco use.  No abdominal trauma.  Compliant with home medications.  No thinners.  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 states  it's a" growth on vocal cord"   Patient Active Problem List   Diagnosis Date Noted   AAA (abdominal aortic aneurysm) (HCC) 11/28/2021   Acute blood loss anemia  (ABLA) 08/21/2021   Hyperkalemia 08/21/2021   Acute cholangitis 08/21/2021   Choledocholithiasis 08/17/2021   Elevated LFTs 08/16/2021   GI bleed 05/26/2021   Diverticulosis of intestine with bleeding 05/10/2021   Cholecystitis 12/18/2020   Hemorrhagic shock (HCC)    AKI (acute kidney injury) (HCC)    Cardiac arrest (HCC)    GIB (gastrointestinal bleeding) 05/24/2020   Symptomatic ABLA due to lower GI bleed 04/14/2019   Unilateral primary osteoarthritis, 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   OSA on CPAP 03/04/2017   Vocal cord nodule    Nocturia    Joint swelling    Joint pain    Hypertension    History of kidney stones    History of colon polyps    Headache    Glaucoma    GERD    Enlarged prostate    Aneurysm of infrarenal abdominal aorta (HCC)    Diverticulosis    Coronary artery disease    Cataracts, bilateral    Bruising    Blood dyscrasia    Arthritis    Anemia    Hydropneumothorax 10/29/2016   S/P AVR 09/15/2016   History of CAD 04/29/2016   Aortic insufficiency 04/01/2016   Thoracic aortic aneurysm (HCC) 09/18/2015   HTN (hypertension) 09/18/2015   Hyperlipidemia 09/18/2015   Asymptomatic bilateral carotid artery  stenosis 08/27/2015   Home Medication(s) Prior to Admission medications   Medication Sig Start Date End Date Taking? Authorizing Provider  acetaminophen (TYLENOL) 500 MG tablet Take 1 tablet (500 mg total) by mouth every 8 (eight) hours as needed for fever (pain). 08/25/21   Hongalgi, Maximino Greenland, MD  amLODipine (NORVASC) 10 MG tablet Take 10 mg by mouth daily.    [provider]  aspirin 81 MG chewable tablet Chew 1 tablet (81 mg total) by mouth daily. 11/29/21   Rhyne, Ames Coupe, PA-C  atenolol (TENORMIN) 25 MG tablet Take 12.5 mg by mouth 2 (two) times daily. 08/26/20   [name], MD  fluticasone [facility]) 50 MCG/ACT nasal spray Place 2 sprays into both nostrils daily as needed for allergies  or rhinitis.    [provider]  latanoprost (XALATAN) 0.005 % ophthalmic solution Place 1 drop into both eyes at bedtime.    [provider]  Multiple Vitamin (MULTIVITAMIN WITH MINERALS) TABS tablet Take 1 tablet by mouth daily.    [provider]  omega-3 acid ethyl esters (LOVAZA) 1 g capsule Take 2 g by mouth 2 (two) times daily.    [provider]  pantoprazole (PROTONIX) 40 MG tablet Take 40 mg by mouth 2 (two) times daily. 11/09/20   [provider]  polyethylene glycol (MIRALAX / GLYCOLAX) 17 g packet Take 17 g by mouth daily as needed for mild constipation. Every other day 08/25/21   Elease Etienne, MD  psyllium (HYDROCIL/METAMUCIL) 95 % PACK Take 1 packet by mouth daily as needed for mild constipation. 08/25/21   Hongalgi, Maximino Greenland, MD  rosuvastatin (CRESTOR) 20 MG tablet Take 20 mg by mouth daily.    [provider]  tamsulosin (FLOMAX) 0.4 MG CAPS capsule Take 0.4 mg by mouth daily. 02/11/22   [provider]  triamcinolone (KENALOG) 0.025 % cream Apply 1 Application topically daily as needed (skin irritation/rash.).    [provider]                                                                                                                                    Past Surgical History Past Surgical History:  Procedure Laterality Date   ABDOMINAL AORTIC ENDOVASCULAR STENT GRAFT N/A 11/28/2021   Procedure: ABDOMINAL AORTIC ENDOVASCULAR STENT GRAFT;  Surgeon: Maeola Harman, MD;  Location: [facility] OR;  Service: Vascular;  Laterality: N/A;   ANEURYSM COILING Right 11/28/2021   Procedure: ANEURYSM COILING RIGHT INTERNAL ILIAC;  Surgeon: Maeola Harman, MD;  Location: [facility] OR;  Service: Vascular;  Laterality: Right;   AORTIC ARCH ANGIOGRAPHY N/A 04/16/2016   Procedure: Aortic Arch Angiography;  Surgeon: [name] Swaziland, MD;  Location: [facility] INVASIVE CV LAB;  Service: Cardiovascular;  Laterality: N/A;   AORTIC  VALVE REPLACEMENT N/A 09/15/2016   Procedure: AORTIC VALVE REPLACEMENT (AVR);  Surgeon: Delight Ovens, MD;  Location: [facility] OR;  Service:  Open Heart Surgery;  Laterality: N/A;  Using 29mm Edwards Perimount Magna Ease Aortic Bioprosthesis Valve   ASCENDING AORTIC ROOT REPLACEMENT N/A 09/15/2016   Procedure: ASCENDING AORTIC ROOT REPLACEMENT;  Surgeon: Delight Ovens, MD;  Location: [facility] OR;  Service: Open Heart Surgery;  Laterality: N/A;  Using 32mm Gelweave Valsalva Graft   CHOLECYSTECTOMY N/A 12/19/2020   Procedure: LAPAROSCOPIC CHOLECYSTECTOMY WITH POSSIBLE INTRAOPERATIVE CHOLANGIOGRAM;  Surgeon: Gaynelle Adu, MD;  Location: [facility] OR;  Service: General;  Laterality: N/A;   COLONOSCOPY     COLONOSCOPY Left 04/16/2019   Procedure: COLONOSCOPY;  Surgeon: Willis Modena, MD;  Location: [facility] ENDOSCOPY;  Service: Endoscopy;  Laterality: Left;   COLONOSCOPY N/A 06/01/2020   Procedure: COLONOSCOPY;  Surgeon: Kerin Salen, MD;  Location: [facility] ENDOSCOPY;  Service: Gastroenterology;  Laterality: N/A;   COLONOSCOPY WITH ESOPHAGOGASTRODUODENOSCOPY (EGD)     COLONOSCOPY WITH PROPOFOL N/A 05/14/2021   Procedure: COLONOSCOPY WITH PROPOFOL;  Surgeon: Kerin Salen, MD;  Location: [facility] ENDOSCOPY;  Service: Gastroenterology;  Laterality: N/A;   ERCP N/A 08/19/2021   Procedure: ENDOSCOPIC RETROGRADE CHOLANGIOPANCREATOGRAPHY (ERCP);  Surgeon: Vida Rigger, MD;  Location: [facility] ENDOSCOPY;  Service: Gastroenterology;  Laterality: N/A;  at 1:30 pm   ESOPHAGOGASTRODUODENOSCOPY N/A 05/25/2020   Procedure: ESOPHAGOGASTRODUODENOSCOPY (EGD);  Surgeon: Charlott Rakes, MD;  Location: [facility] ENDOSCOPY;  Service: Endoscopy;  Laterality: N/A;   ESOPHAGOGASTRODUODENOSCOPY N/A 08/19/2021   Procedure: ESOPHAGOGASTRODUODENOSCOPY (EGD);  Surgeon: Kathi Der, MD;  Location: [facility] ENDOSCOPY;  Service: Gastroenterology;  Laterality: N/A;   ESOPHAGOGASTRODUODENOSCOPY (EGD) WITH PROPOFOL Left 04/16/2019   Procedure: ESOPHAGOGASTRODUODENOSCOPY (EGD) WITH  PROPOFOL;  Surgeon: Willis Modena, MD;  Location: [facility] ENDOSCOPY;  Service: Endoscopy;  Laterality: Left;   ESOPHAGOGASTRODUODENOSCOPY (EGD) WITH PROPOFOL N/A 08/23/2021   Procedure: ESOPHAGOGASTRODUODENOSCOPY (EGD) WITH PROPOFOL;  Surgeon: Kerin Salen, MD;  Location: [facility] ENDOSCOPY;  Service: Gastroenterology;  Laterality: N/A;   FLEXIBLE SIGMOIDOSCOPY N/A 05/25/2020   Procedure: FLEXIBLE SIGMOIDOSCOPY;  Surgeon: Charlott Rakes, MD;  Location: [facility] ENDOSCOPY;  Service: Endoscopy;  Laterality: N/A;   HEMOSTASIS CONTROL  08/19/2021   Procedure: HEMOSTASIS CONTROL;  Surgeon: Kathi Der, MD;  Location: [facility] ENDOSCOPY;  Service: Gastroenterology;;   IR ANGIOGRAM SELECTIVE EACH ADDITIONAL VESSEL  05/26/2021   IR ANGIOGRAM SELECTIVE EACH ADDITIONAL VESSEL  08/20/2021   IR ANGIOGRAM SELECTIVE EACH ADDITIONAL VESSEL  08/20/2021   IR ANGIOGRAM VISCERAL SELECTIVE  05/26/2021   IR ANGIOGRAM VISCERAL SELECTIVE  08/20/2021   IR EMBO ART  VEN HEMORR LYMPH EXTRAV  INC GUIDE ROADMAPPING  05/26/2021   IR EMBO ART  VEN HEMORR LYMPH EXTRAV  INC GUIDE ROADMAPPING  08/20/2021   IR FLUORO GUIDE CV LINE RIGHT  08/20/2021   IR THORACENTESIS ASP PLEURAL SPACE W/IMG GUIDE  10/23/2016   IR US GUIDE VASC ACCESS LEFT  05/26/2021   IR US GUIDE VASC ACCESS RIGHT  05/26/2021   IR US GUIDE VASC ACCESS RIGHT  08/20/2021   IR US GUIDE VASC ACCESS RIGHT  08/20/2021   MICROLARYNGOSCOPY Right 09/20/2017   Procedure: MICROLARYNGOSCOPY WITH  EXCISION OF VOCAL CORD LESION;  Surgeon: Newman Pies, MD;  Location: [facility];  Service: ENT;  Laterality: Right;   MULTIPLE EXTRACTIONS WITH ALVEOLOPLASTY N/A 06/10/2016   Procedure: Extraction of tooth #'s 2,8,13,15, and 29  with alveoloplasty, maxillary right and left buccal exostoses reductions, and gross debridement of remaining teeth.;  Surgeon: Charlynne Pander, DDS;  Location: [facility] OR;  Service: Oral Surgery;  Laterality: N/A;   POLYPECTOMY  05/14/2021   Procedure: POLYPECTOMY;  Surgeon:  Marca Ancona,  Marcos Eke, MD;  Location: [facility] ENDOSCOPY;  Service: Gastroenterology;;   REMOVAL OF STONES  08/19/2021   Procedure: REMOVAL OF STONES;  Surgeon: Vida Rigger, MD;  Location: [facility] ENDOSCOPY;  Service: Gastroenterology;;   RIGHT/LEFT HEART CATH AND CORONARY ANGIOGRAPHY N/A 04/16/2016   Procedure: Right/Left Heart Cath and Coronary Angiography;  Surgeon: [name] Swaziland, MD;  Location: [facility] INVASIVE CV LAB;  Service: Cardiovascular;  Laterality: N/A;   SPHINCTEROTOMY  08/19/2021   Procedure: SPHINCTEROTOMY;  Surgeon: Vida Rigger, MD;  Location: [facility] ENDOSCOPY;  Service: Gastroenterology;;   TEE WITHOUT CARDIOVERSION N/A 09/15/2016   Procedure: TRANSESOPHAGEAL ECHOCARDIOGRAM (TEE);  Surgeon: Delight Ovens, MD;  Location: [facility] OR;  Service: Open Heart Surgery;  Laterality: N/A;   Family History Family History  Problem Relation Age of Onset   Lymphoma Mother    Heart disease Father    Heart attack Father    Thyroid disease Sister     Social History Social History   Tobacco Use   Smoking status: Former    Current packs/day: 1.00    Average packs/day: 1 pack/day for 25.0 years (25.0 ttl pk-yrs)    Types: Cigarettes   Smokeless tobacco: Never  Vaping Use   Vaping status: Never Used  Substance Use Topics   Alcohol use: No    Alcohol/week: 0.0 standard drinks of alcohol   Drug use: No   Allergies Patient has no known allergies.  Review of Systems Review of Systems  Constitutional:  Negative for chills, fatigue and fever.  Respiratory:  Negative for cough and shortness of breath.   Cardiovascular:  Negative for chest pain and palpitations.  Gastrointestinal:  Positive for abdominal pain and blood in stool. Negative for nausea and vomiting.  Genitourinary:  Negative for dysuria and urgency.  Skin:  Negative for rash and wound.  All other systems reviewed and are negative.   Physical Exam Vital Signs  I have reviewed the triage vital signs BP 138/79   Pulse (!) 54   Temp 98.2 F (36.8  C) (Oral)   Resp 18   Ht 5\' 9"  (1.753 m)   Wt 84.4 kg   SpO2 98%   BMI 27.47 kg/m  Physical Exam Vitals and nursing note reviewed. Exam conducted with a chaperone present (EDT Alyssa).  Constitutional:      General: He is not in acute distress.    Appearance: Normal appearance. He is well-developed. He is not ill-appearing or diaphoretic.  HENT:     Head: Normocephalic and atraumatic.     Right Ear: External ear normal.     Left Ear: External ear normal.     Mouth/Throat:     Mouth: Mucous membranes are moist.  Eyes:     General: No scleral icterus. Cardiovascular:     Rate and Rhythm: Normal rate and regular rhythm.     Pulses: Normal pulses.     Heart sounds: Normal heart sounds.  Pulmonary:     Effort: Pulmonary effort is normal. No respiratory distress.     Breath sounds: Normal breath sounds.  Abdominal:     General: Abdomen is flat.     Palpations: Abdomen is soft.     Tenderness: There is no abdominal tenderness.  Genitourinary:    Comments: External hemorrhoids noted on exam, not thrombosed, no melena in rectal vault No frank bleeding on exam Musculoskeletal:     Cervical back: No rigidity.     Right lower leg: No edema.     Left lower leg: No  edema.  Skin:    General: Skin is warm and dry.     Capillary Refill: Capillary refill takes less than 2 seconds.  Neurological:     Mental Status: He is alert and oriented to person, place, and time.     GCS: GCS eye subscore is 4. GCS verbal subscore is 5. GCS motor subscore is 6.  Psychiatric:        Mood and Affect: Mood normal.        Behavior: Behavior normal.     ED Results and Treatments Labs (all labs ordered are listed, but only abnormal results are displayed) Labs Reviewed  CBC WITH DIFFERENTIAL/PLATELET - Abnormal; Notable for the following components:      Result Value   RBC 3.48 (*)    Hemoglobin 10.9 (*)    HCT 31.9 (*)    Platelets 105 (*)    All other components within normal limits   COMPREHENSIVE METABOLIC PANEL - Abnormal; Notable for the following components:   Glucose, Bld 108 (*)    Creatinine, Ser 1.35 (*)    GFR, Estimated 56 (*)    All other components within normal limits  URINALYSIS, ROUTINE W REFLEX MICROSCOPIC - Abnormal; Notable for the following components:   Color, Urine COLORLESS (*)    Hgb urine dipstick LARGE (*)    All other components within normal limits  OCCULT BLOOD X 1 CARD TO LAB, STOOL - Abnormal; Notable for the following components:   Fecal Occult Bld POSITIVE (*)    All other components within normal limits  LIPASE, BLOOD  PROTIME-INR                                                                                                                          Radiology CT ABDOMEN PELVIS W CONTRAST  Result Date: 08/21/2022 CLINICAL DATA:  Abdominal pain, acute, nonlocalized some lower quad pain, rectal bleeding, hx diverticulitis EXAM: CT ABDOMEN AND PELVIS WITH CONTRAST TECHNIQUE: Multidetector CT imaging of the abdomen and pelvis was performed using the standard protocol following bolus administration of intravenous contrast. RADIATION DOSE REDUCTION: This exam was performed according to the departmental dose-optimization program which includes automated exposure control, adjustment of the mA and/or kV according to patient size and/or use of iterative reconstruction technique. CONTRAST:  OMNIPAQUE IOHEXOL 300 MG/ML  SOLN COMPARISON:  CTA 07/13/2022 FINDINGS: Lower chest: No acute findings. Hepatobiliary: Scattered subcentimeter hypodense liver lesions, too small to accurately characterize, needing no further imaging follow-up. Clips in the gallbladder fossa postcholecystectomy. No biliary dilatation. Pancreas: No ductal dilatation or inflammation. Spleen: Normal in size without focal abnormality. Adrenals/Urinary Tract: Normal adrenal glands. Stable appearance of dystrophic calcifications in the left upper pole. Additional simple bilateral renal  cysts including parapelvic cysts. No specific imaging follow-up is needed. No hydronephrosis or renal inflammation. Small diverticulum arising from the superior aspect of the bladder. No bladder wall thickening. Stomach/Bowel: Colonic diverticulosis without acute diverticulitis or evidence of colonic inflammation. Normal appendix.  The stomach is nondistended. There is no small bowel obstruction or inflammatory change. Vascular/Lymphatic: Aorto bi-iliac endograft repair of abdominal aortic aneurysm. The endograft is patent. The aneurysm sac measures 5.5 cm, unchanged from prior exam. 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 Coil embolization of aneurysmal right internal iliac artery as before. Upper abdominal vascular coils again seen. No acute vascular findings. No abdominopelvic adenopathy. Reproductive: Borderline prostatic enlargement.  Acute findings. Other: No free air or ascites. Small fat containing umbilical hernia. Musculoskeletal: Advanced right hip arthropathy with intra-articular bodies and flattening of the femoral head, chronic. Diffuse degenerative change in the included spine. There are no acute or suspicious osseous abnormalities. IMPRESSION: 1. No acute abnormality or explanation for abdominal pain. 2. Colonic diverticulosis without acute diverticulitis. 3. Aorto bi-iliac endograft repair of abdominal aortic aneurysm. The aneurysm sac measures 5.5 cm, unchanged from prior exam. 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 Electronically Signed   By: Narda Rutherford M.D.   On: 08/21/2022 18:51    Pertinent labs & imaging results that were available during my care of the patient were reviewed by me and considered in my medical decision making (see MDM for details).  Medications Ordered in ED Medications  pantoprazole (PROTONIX) injection 40 mg (has no administration in time range)  iohexol (OMNIPAQUE) 300 MG/ML solution 100 mL (100 mLs Intravenous Contrast  Given 08/21/22 1831)                                                                                                                                     Procedures Procedures  (including critical care time)  Medical Decision Making / ED Course    Medical Decision Making:    Lonnie Hansen is a 72 y.o. male  with past medical history as below, significant for AAA, ITP, CAD, GERD, HLD, HTN, diverticulitis, external hemorrhoids who presents to the ED with complaint of rectal bleeding.. The complaint involves an extensive differential diagnosis and also carries with it a high risk of complications and morbidity.  Serious etiology was considered. Ddx includes but is not limited to: Differential diagnosis includes but is not exclusive to acute appendicitis, renal colic, testicular torsion, urinary tract infection, prostatitis,  diverticulitis, small bowel obstruction, colitis, abdominal aortic aneurysm, gastroenteritis, constipation etc.   Complete initial physical exam performed, notably the patient  was no acute distress, does not appear pale, no conjunctival pallor, abdomen is nonperitoneal.    Reviewed and confirmed nursing documentation for past medical history, family history, social history.  Vital signs reviewed.    Clinical Course as of 08/21/22 1910  Fri Aug 21, 2022  1750 Hemoglobin(!): 10.9 Similar to prior [SG]  1856 CTAP with diverticulosis, no acute changes  [SG]  1906 Fecal Occult Blood, POC(!): POSITIVE Hgb stable, HDS, CT stable, given protonix here [SG]    Clinical Course User Index [SG] Sloan Leiter, DO   Patient  with melena this morning and some blood in his stool this afternoon.  No melena on rectal exam.  Hemoccult is positive, does have multiple external hemorrhoids that do not appear to be thrombosed.  Hgb stable, HDS, CT imaging w/ diverticulosis.  Start patient on Protonix and have him follow-up with GI for further evaluation.  The patient improved  significantly and was discharged in stable condition. Detailed discussions were had with the patient regarding current findings, and need for close f/u with PCP or on call doctor. The patient has been instructed to return immediately if the symptoms worsen in any way for re-evaluation. Patient verbalized understanding and is in agreement with current care plan. All questions answered prior to discharge.          Additional history obtained: -Additional history obtained from na -External records from outside source obtained and reviewed including: Chart review including previous notes, labs, imaging, consultation notes including medications, primary care documentation, prior labs and imaging Follows with Dr. Loreta Ave reviewed [facility] Dr. Truett Perna oncology >> on romiplostim injections for ITP Dr. Laneta Simmers AAA   Lab Tests: -I ordered, reviewed, and interpreted labs.   The pertinent results include:   Labs Reviewed  CBC WITH DIFFERENTIAL/PLATELET - Abnormal; Notable for the following components:      Result Value   RBC 3.48 (*)    Hemoglobin 10.9 (*)    HCT 31.9 (*)    Platelets 105 (*)    All other components within normal limits  COMPREHENSIVE METABOLIC PANEL - Abnormal; Notable for the following components:   Glucose, Bld 108 (*)    Creatinine, Ser 1.35 (*)    GFR, Estimated 56 (*)    All other components within normal limits  URINALYSIS, ROUTINE W REFLEX MICROSCOPIC - Abnormal; Notable for the following components:   Color, Urine COLORLESS (*)    Hgb urine dipstick LARGE (*)    All other components within normal limits  OCCULT BLOOD X 1 CARD TO LAB, STOOL - Abnormal; Notable for the following components:   Fecal Occult Bld POSITIVE (*)    All other components within normal limits  LIPASE, BLOOD  PROTIME-INR    Notable for fobt +tive  EKG   EKG Interpretation Date/Time:    Ventricular Rate:    PR Interval:    QRS Duration:    QT Interval:    QTC Calculation:   R  Axis:      Text Interpretation:           Imaging Studies ordered: I ordered imaging studies including CT a/p I independently visualized the following imaging with scope of interpretation limited to determining acute life threatening conditions related to emergency care; findings noted above, significant for stable imaging, diverticulosis  I independently visualized and interpreted imaging. I agree with the radiologist interpretation   Medicines ordered and prescription drug management: Meds ordered this encounter  Medications   pantoprazole (PROTONIX) injection 40 mg   iohexol (OMNIPAQUE) 300 MG/ML solution 100 mL    -I have reviewed the patients home medicines and have made adjustments as needed   Consultations Obtained: na   Cardiac Monitoring: Continuous pulse oximetry 99-100% on RA interpreted by myself   Social Determinants of Health:  Diagnosis or treatment significantly limited by social determinants of health: former smoker   Reevaluation: After the interventions noted above, I reevaluated the patient and found that they have stayed the same  Co morbidities that complicate the patient evaluation  Past Medical History:  Diagnosis Date  AAA (abdominal aortic aneurysm) (HCC)    Anemia    Arthritis    Asymptomatic bilateral carotid artery stenosis 08/2015   1-39%    Cataracts, bilateral    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 states  it's a" growth on vocal cord"       Dispostion: Disposition decision including need for hospitalization was considered, and patient discharged from emergency department.    Final Clinical Impression(s) / ED Diagnoses Final diagnoses:  None     This chart was dictated using voice recognition software.  Despite best efforts to proofread,  errors can occur which can change the documentation meaning.    Sloan Leiter, DO 08/21/22 1910

## 2022-08-21 NOTE — ED Triage Notes (Addendum)
Pt report having black clumpy stool today. Also reports bright red blood when he wipe. Called his GI doc and told him to come to ER. Pt has hx of hemorrhoids.

## 2022-08-21 NOTE — Discharge Instructions (Addendum)
Please avoid NSAID's such as goody's powder, motrin, aleve if possible Avoid tobacco/alcohol use Follow up with you gastroenterologist in 2 weeks Please continue to take your protonix daily  It was a pleasure caring for you today in the emergency department.  Please return to the emergency department for any worsening or worrisome symptoms.

## 2022-08-25 ENCOUNTER — Inpatient Hospital Stay: Payer: Medicare Other

## 2022-08-25 VITALS — BP 142/80 | HR 61 | Temp 97.9°F | Resp 18

## 2022-08-25 DIAGNOSIS — D693 Immune thrombocytopenic purpura: Secondary | ICD-10-CM

## 2022-08-25 DIAGNOSIS — Z79899 Other long term (current) drug therapy: Secondary | ICD-10-CM | POA: Diagnosis not present

## 2022-08-25 LAB — CBC WITH DIFFERENTIAL (CANCER CENTER ONLY)
Abs Immature Granulocytes: 0.03 10*3/uL (ref 0.00–0.07)
Basophils Absolute: 0 10*3/uL (ref 0.0–0.1)
Basophils Relative: 0 %
Eosinophils Absolute: 0.2 10*3/uL (ref 0.0–0.5)
Eosinophils Relative: 3 %
HCT: 31.2 % — ABNORMAL LOW (ref 39.0–52.0)
Hemoglobin: 10.3 g/dL — ABNORMAL LOW (ref 13.0–17.0)
Immature Granulocytes: 0 %
Lymphocytes Relative: 18 %
Lymphs Abs: 1.2 10*3/uL (ref 0.7–4.0)
MCH: 30.9 pg (ref 26.0–34.0)
MCHC: 33 g/dL (ref 30.0–36.0)
MCV: 93.7 fL (ref 80.0–100.0)
Monocytes Absolute: 0.7 10*3/uL (ref 0.1–1.0)
Monocytes Relative: 10 %
Neutro Abs: 4.7 10*3/uL (ref 1.7–7.7)
Neutrophils Relative %: 69 %
Platelet Count: 91 10*3/uL — ABNORMAL LOW (ref 150–400)
RBC: 3.33 MIL/uL — ABNORMAL LOW (ref 4.22–5.81)
RDW: 13.4 % (ref 11.5–15.5)
WBC Count: 6.9 10*3/uL (ref 4.0–10.5)
nRBC: 0 % (ref 0.0–0.2)

## 2022-08-25 LAB — CMP (CANCER CENTER ONLY)
ALT: 7 U/L (ref 0–44)
AST: 17 U/L (ref 15–41)
Albumin: 4.5 g/dL (ref 3.5–5.0)
Alkaline Phosphatase: 43 U/L (ref 38–126)
Anion gap: 8 (ref 5–15)
BUN: 16 mg/dL (ref 8–23)
CO2: 30 mmol/L (ref 22–32)
Calcium: 9.7 mg/dL (ref 8.9–10.3)
Chloride: 102 mmol/L (ref 98–111)
Creatinine: 1.42 mg/dL — ABNORMAL HIGH (ref 0.61–1.24)
GFR, Estimated: 53 mL/min — ABNORMAL LOW (ref >60)
Glucose, Bld: 104 mg/dL — ABNORMAL HIGH (ref 70–99)
Potassium: 4.1 mmol/L (ref 3.5–5.1)
Sodium: 140 mmol/L (ref 135–145)
Total Bilirubin: 0.7 mg/dL (ref 0.3–1.2)
Total Protein: 7.6 g/dL (ref 6.5–8.1)

## 2022-08-25 MED ORDER — ROMIPLOSTIM 125 MCG ~~LOC~~ SOLR
95.0000 ug | Freq: Once | SUBCUTANEOUS | Status: AC
  Administered 2022-08-25: 95 ug via SUBCUTANEOUS
  Filled 2022-08-25: qty 0.19

## 2022-08-25 NOTE — Patient Instructions (Signed)
Romiplostim Injection What is this medication? ROMIPLOSTIM (roe mi PLOE stim) treats low levels of platelets in your body caused by immune thrombocytopenia (ITP). It is prescribed when other medications have not worked or cannot be tolerated. It may also be used to help people who have been exposed to high doses of radiation. It works by increasing the amount of platelets in your blood. This lowers the risk of bleeding. This medicine may be used for other purposes; ask your health care provider or pharmacist if you have questions. COMMON BRAND NAME(S): Nplate What should I tell my care team before I take this medication? They need to know if you have any of these conditions: Blood clots Myelodysplastic syndrome An unusual or allergic reaction to romiplostim, mannitol, other medications, foods, dyes, or preservatives Pregnant or trying to get pregnant Breast-feeding How should I use this medication? This medication is injected under the skin. It is given by a care team in a hospital or clinic setting. A special MedGuide will be given to you before each treatment. Be sure to read this information carefully each time. Talk to your care team about the use of this medication in children. While it may be prescribed for children as young as newborns for selected conditions, precautions do apply. Overdosage: If you think you have taken too much of this medicine contact a poison control center or emergency room at once. NOTE: This medicine is only for you. Do not share this medicine with others. What if I miss a dose? Keep appointments for follow-up doses. It is important not to miss your dose. Call your care team if you are unable to keep an appointment. What may interact with this medication? Interactions are not expected. This list may not describe all possible interactions. Give your health care provider a list of all the medicines, herbs, non-prescription drugs, or dietary supplements you use. Also  tell them if you smoke, drink alcohol, or use illegal drugs. Some items may interact with your medicine. What should I watch for while using this medication? Visit your care team for regular checks on your progress. You may need blood work done while you are taking this medication. Your condition will be monitored carefully while you are receiving this medication. It is important not to miss any appointments. What side effects may I notice from receiving this medication? Side effects that you should report to your care team as soon as possible: Allergic reactions--skin rash, itching, hives, swelling of the face, lips, tongue, or throat Blood clot--pain, swelling, or warmth in the leg, shortness of breath, chest pain Side effects that usually do not require medical attention (report to your care team if they continue or are bothersome): Dizziness Joint pain Muscle pain Pain in the hands or feet Stomach pain Trouble sleeping This list may not describe all possible side effects. Call your doctor for medical advice about side effects. You may report side effects to FDA at 1-800-FDA-1088. Where should I keep my medication? This medication is given in a hospital or clinic. It will not be stored at home. NOTE: This sheet is a summary. It may not cover all possible information. If you have questions about this medicine, talk to your doctor, pharmacist, or health care provider.  2024 Elsevier/Gold Standard (2021-05-19 00:00:00)  

## 2022-08-26 ENCOUNTER — Telehealth: Payer: Self-pay

## 2022-08-26 NOTE — Telephone Encounter (Signed)
Transition Care Management Unsuccessful Follow-up Telephone Call  Date of discharge and from where:  08/21/2022 [facility]  Attempts:  1st Attempt  Reason for unsuccessful TCM follow-up call:  Left voice message  Branson Kranz [facility]  [facility] Population Health Community Resource Care Guide   [email or web address]  ?? [phone]   Website: [email or web address]  [email or web address]

## 2022-08-27 ENCOUNTER — Telehealth: Payer: Self-pay

## 2022-08-27 NOTE — Telephone Encounter (Signed)
Transition Care Management Unsuccessful Follow-up Telephone Call  Date of discharge and from where:  08/21/2022 [facility]  Attempts:  2nd Attempt  Reason for unsuccessful TCM follow-up call:  Left voice message   [facility]  [facility] Population Health Community Resource Care Guide   [email or web address]  ?? [phone]   Website: [email or web address]  [email or web address]

## 2022-09-01 DIAGNOSIS — J3489 Other specified disorders of nose and nasal sinuses: Secondary | ICD-10-CM | POA: Diagnosis not present

## 2022-09-01 DIAGNOSIS — Z1152 Encounter for screening for COVID-19: Secondary | ICD-10-CM | POA: Diagnosis not present

## 2022-09-01 DIAGNOSIS — R5383 Other fatigue: Secondary | ICD-10-CM | POA: Diagnosis not present

## 2022-09-01 DIAGNOSIS — J309 Allergic rhinitis, unspecified: Secondary | ICD-10-CM | POA: Diagnosis not present

## 2022-09-01 DIAGNOSIS — R059 Cough, unspecified: Secondary | ICD-10-CM | POA: Diagnosis not present

## 2022-09-01 DIAGNOSIS — U071 COVID-19: Secondary | ICD-10-CM | POA: Diagnosis not present

## 2022-09-07 ENCOUNTER — Other Ambulatory Visit: Payer: Self-pay | Admitting: Family

## 2022-09-07 DIAGNOSIS — D693 Immune thrombocytopenic purpura: Secondary | ICD-10-CM

## 2022-09-08 ENCOUNTER — Inpatient Hospital Stay: Payer: Medicare Other

## 2022-09-08 ENCOUNTER — Inpatient Hospital Stay: Payer: Medicare Other | Attending: Hematology & Oncology | Admitting: Medical Oncology

## 2022-09-08 ENCOUNTER — Other Ambulatory Visit: Payer: Self-pay

## 2022-09-08 ENCOUNTER — Encounter: Payer: Self-pay | Admitting: Medical Oncology

## 2022-09-08 VITALS — BP 153/80 | HR 58 | Temp 98.1°F | Resp 19 | Ht 69.0 in | Wt 184.0 lb

## 2022-09-08 DIAGNOSIS — Z79899 Other long term (current) drug therapy: Secondary | ICD-10-CM | POA: Insufficient documentation

## 2022-09-08 DIAGNOSIS — Z8616 Personal history of COVID-19: Secondary | ICD-10-CM | POA: Insufficient documentation

## 2022-09-08 DIAGNOSIS — D693 Immune thrombocytopenic purpura: Secondary | ICD-10-CM | POA: Insufficient documentation

## 2022-09-08 LAB — CBC WITH DIFFERENTIAL (CANCER CENTER ONLY)
Abs Immature Granulocytes: 0.05 10*3/uL (ref 0.00–0.07)
Basophils Absolute: 0 10*3/uL (ref 0.0–0.1)
Basophils Relative: 0 %
Eosinophils Absolute: 0.2 10*3/uL (ref 0.0–0.5)
Eosinophils Relative: 3 %
HCT: 32.5 % — ABNORMAL LOW (ref 39.0–52.0)
Hemoglobin: 10.6 g/dL — ABNORMAL LOW (ref 13.0–17.0)
Immature Granulocytes: 1 %
Lymphocytes Relative: 19 %
Lymphs Abs: 1.1 10*3/uL (ref 0.7–4.0)
MCH: 30.6 pg (ref 26.0–34.0)
MCHC: 32.6 g/dL (ref 30.0–36.0)
MCV: 93.9 fL (ref 80.0–100.0)
Monocytes Absolute: 0.7 10*3/uL (ref 0.1–1.0)
Monocytes Relative: 11 %
Neutro Abs: 4 10*3/uL (ref 1.7–7.7)
Neutrophils Relative %: 66 %
Platelet Count: 109 10*3/uL — ABNORMAL LOW (ref 150–400)
RBC: 3.46 MIL/uL — ABNORMAL LOW (ref 4.22–5.81)
RDW: 13.3 % (ref 11.5–15.5)
WBC Count: 6 10*3/uL (ref 4.0–10.5)
nRBC: 0 % (ref 0.0–0.2)

## 2022-09-08 LAB — CMP (CANCER CENTER ONLY)
ALT: 12 U/L (ref 0–44)
AST: 22 U/L (ref 15–41)
Albumin: 4.2 g/dL (ref 3.5–5.0)
Alkaline Phosphatase: 41 U/L (ref 38–126)
Anion gap: 10 (ref 5–15)
BUN: 16 mg/dL (ref 8–23)
CO2: 26 mmol/L (ref 22–32)
Calcium: 9 mg/dL (ref 8.9–10.3)
Chloride: 101 mmol/L (ref 98–111)
Creatinine: 1.3 mg/dL — ABNORMAL HIGH (ref 0.61–1.24)
GFR, Estimated: 58 mL/min — ABNORMAL LOW (ref >60)
Glucose, Bld: 104 mg/dL — ABNORMAL HIGH (ref 70–99)
Potassium: 4.2 mmol/L (ref 3.5–5.1)
Sodium: 137 mmol/L (ref 135–145)
Total Bilirubin: 0.6 mg/dL (ref 0.3–1.2)
Total Protein: 7.3 g/dL (ref 6.5–8.1)

## 2022-09-08 NOTE — Progress Notes (Signed)
Hematology and Oncology Follow Up Visit  Lonnie Hansen [id number] [birth year] 72 y.o. 09/08/2022   Principle Diagnosis:  Chronic ITP   Current Therapy:        Nplate to keep platelet count over 100,000   Interim History:  Lonnie Hansen is here today for follow-up and injection. He is doing well and has no complaints at this time.   He reports that he is doing well today. Right after his last visit he was exposed and caught COVID-19. He reports mild symptoms and has fully recovered.   He has not noted any issue with bleeding or abnormal bruising. No petechiae.  No issue with infection. No fever, chills, n/v, cough, rash, dizziness, SOB, chest pain, palpitations, abdominal pain or changes in bowel or bladder habits.  No falls or syncope. He ambulates with his cane for added support.  Appetite is fair and he is doing his best to stay well hydrated.  Wt Readings from Last 3 Encounters:  09/08/22 184 lb (83.5 kg)  08/21/22 186 lb (84.4 kg)  07/22/22 190 lb (86.2 kg)     ECOG Performance Status: 1 - Symptomatic but completely ambulatory  Medications:  Allergies as of 09/08/2022   No Known Allergies      Medication List        Accurate as of September 08, 2022  2:54 PM. If you have any questions, ask your nurse or doctor.          acetaminophen 500 MG tablet Commonly known as: TYLENOL Take 1 tablet (500 mg total) by mouth every 8 (eight) hours as needed for fever (pain).   amLODipine 10 MG tablet Commonly known as: NORVASC Take 10 mg by mouth daily.   aspirin 81 MG chewable tablet Chew 1 tablet (81 mg total) by mouth daily.   atenolol 25 MG tablet Commonly known as: TENORMIN Take 12.5 mg by mouth 2 (two) times daily.   fluticasone 50 MCG/ACT nasal spray Commonly known as: FLONASE Place 2 sprays into both nostrils daily as needed for allergies or rhinitis.   latanoprost 0.005 % ophthalmic solution Commonly known as: XALATAN Place 1 drop into both eyes at  bedtime.   multivitamin with minerals Tabs tablet Take 1 tablet by mouth daily.   omega-3 acid ethyl esters 1 g capsule Commonly known as: LOVAZA Take 2 g by mouth 2 (two) times daily.   pantoprazole 40 MG tablet Commonly known as: PROTONIX Take 40 mg by mouth 2 (two) times daily.   polyethylene glycol 17 g packet Commonly known as: MIRALAX / GLYCOLAX Take 17 g by mouth daily as needed for mild constipation. Every other day   psyllium 95 % Pack Commonly known as: HYDROCIL/METAMUCIL Take 1 packet by mouth daily as needed for mild constipation.   rosuvastatin 20 MG tablet Commonly known as: CRESTOR Take 20 mg by mouth daily.   sucralfate 1 g tablet Commonly known as: Carafate Take 1 tablet (1 g total) by mouth with breakfast, with lunch, and with evening meal for 7 days.   tamsulosin 0.4 MG Caps capsule Commonly known as: FLOMAX Take 0.4 mg by mouth daily.   triamcinolone 0.025 % cream Commonly known as: KENALOG Apply 1 Application topically daily as needed (skin irritation/rash.).        Allergies: No Known Allergies  Past Medical History, Surgical history, Social history, and Family History were reviewed and updated.  Review of Systems: All other 10 point review of systems is negative.   Physical Exam:  height is 5\' 9"  (1.753 m) and weight is 184 lb (83.5 kg). His oral temperature is 98.1 F (36.7 C). His blood pressure is 153/80 (abnormal) and his pulse is 58 (abnormal). His respiration is 19 and oxygen saturation is 99%.   Wt Readings from Last 3 Encounters:  09/08/22 184 lb (83.5 kg)  08/21/22 186 lb (84.4 kg)  07/22/22 190 lb (86.2 kg)    Ocular: Sclerae unicteric, pupils equal, round and reactive to light Ear-nose-throat: Oropharynx clear, dentition fair Lymphatic: No cervical or supraclavicular adenopathy Lungs no rales or rhonchi, good excursion bilaterally Heart regular rate and rhythm, no murmur appreciated Abd soft, nontender, positive bowel  sounds MSK no focal spinal tenderness, no joint edema Neuro: non-focal, well-oriented, appropriate affect  Lab Results  Component Value Date   WBC 6.9 08/25/2022   HGB 10.3 (L) 08/25/2022   HCT 31.2 (L) 08/25/2022   MCV 93.7 08/25/2022   PLT 91 (L) 08/25/2022   Lab Results  Component Value Date   FERRITIN 28 10/31/2021   IRON 91 10/31/2021   TIBC 419 10/31/2021   UIBC 328 10/31/2021   IRONPCTSAT 22 10/31/2021   Lab Results  Component Value Date   RETICCTPCT 2.1 10/31/2021   RBC 3.33 (L) 08/25/2022   No results found for: "KPAFRELGTCHN", "LAMBDASER", "KAPLAMBRATIO" No results found for: "IGGSERUM", "IGA", "IGMSERUM" No results found for: "TOTALPROTELP", "ALBUMINELP", "A1GS", "A2GS", "BETS", "BETA2SER", "GAMS", "MSPIKE", "SPEI"   Chemistry      Component Value Date/Time   NA 140 08/25/2022 1412   NA 140 05/11/2019 0946   K 4.1 08/25/2022 1412   CL 102 08/25/2022 1412   CO2 30 08/25/2022 1412   BUN 16 08/25/2022 1412   BUN 17 05/11/2019 0946   CREATININE 1.42 (H) 08/25/2022 1412   CREATININE 0.75 04/10/2016 1132      Component Value Date/Time   CALCIUM 9.7 08/25/2022 1412   ALKPHOS 43 08/25/2022 1412   AST 17 08/25/2022 1412   ALT 7 08/25/2022 1412   BILITOT 0.7 08/25/2022 1412       Impression and Plan: Lonnie Hansen is a very pleasant 72 yo African American gentleman with chronic ITP.      Platelets are 109 and he remains asymptomatic at this time.  We will proceed with Nplate injection.  We will have him come back every 2 weeks for injection and follow-up in 8 weeks.   Disposition No Nplate today RTC every 2 weeks for labs (CBC) and injection RTC 8 weeks APP, labs (CBC), injection-[place]  Brand Males [place], [place] 8/13/20242:54 PM

## 2022-09-09 ENCOUNTER — Encounter: Payer: Self-pay | Admitting: Vascular Surgery

## 2022-09-09 ENCOUNTER — Ambulatory Visit (INDEPENDENT_AMBULATORY_CARE_PROVIDER_SITE_OTHER): Payer: Medicare Other | Admitting: Vascular Surgery

## 2022-09-09 VITALS — BP 147/86 | HR 58 | Temp 97.7°F | Resp 18 | Ht 69.0 in | Wt 188.8 lb

## 2022-09-09 DIAGNOSIS — I723 Aneurysm of iliac artery: Secondary | ICD-10-CM

## 2022-09-09 NOTE — H&P (View-Only) (Signed)
Patient ID: Lonnie Hansen, male   DOB: December 30, 1950, 72 y.o.   MRN: [id number]  Reason for Consult: No chief complaint on file.   Referred by [name], MD  Subjective:     HPI:  Lonnie Hansen is a 72 y.o. 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  He does have some back pain which is chronic.  Recently was evaluated with CT scan for abdominal pain with no acute findings.  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 states  it's a" growth on vocal cord"   Family History  Problem Relation Age of Onset   Lymphoma Mother    Heart disease Father    Heart attack Father    Thyroid disease Sister    Past Surgical History:  Procedure Laterality Date   ABDOMINAL AORTIC ENDOVASCULAR STENT GRAFT N/A 11/28/2021   Procedure: ABDOMINAL AORTIC ENDOVASCULAR STENT GRAFT;  Surgeon: Maeola Harman, MD;  Location: [facility] OR;  Service: Vascular;  Laterality: N/A;   ANEURYSM COILING Right 11/28/2021   Procedure: ANEURYSM COILING RIGHT INTERNAL ILIAC;  Surgeon: Maeola Harman, MD;  Location: [facility] OR;  Service: Vascular;  Laterality: Right;   AORTIC ARCH ANGIOGRAPHY N/A 04/16/2016    Procedure: Aortic Arch Angiography;  Surgeon: [name] Swaziland, MD;  Location: [facility] INVASIVE CV LAB;  Service: Cardiovascular;  Laterality: N/A;   AORTIC VALVE REPLACEMENT N/A 09/15/2016   Procedure: AORTIC VALVE REPLACEMENT (AVR);  Surgeon: Delight Ovens, MD;  Location: [facility] OR;  Service: Open Heart Surgery;  Laterality: N/A;  Using 29mm Edwards Perimount Magna Ease Aortic Bioprosthesis Valve   ASCENDING AORTIC ROOT REPLACEMENT N/A 09/15/2016   Procedure: ASCENDING AORTIC ROOT REPLACEMENT;  Surgeon: Delight Ovens, MD;  Location: [facility] OR;  Service: Open Heart Surgery;  Laterality: N/A;  Using 32mm Gelweave Valsalva Graft   CHOLECYSTECTOMY N/A 12/19/2020   Procedure: LAPAROSCOPIC CHOLECYSTECTOMY WITH POSSIBLE INTRAOPERATIVE CHOLANGIOGRAM;  Surgeon: Gaynelle Adu, MD;  Location: [facility] OR;  Service: General;  Laterality: N/A;   COLONOSCOPY     COLONOSCOPY Left 04/16/2019   Procedure: COLONOSCOPY;  Surgeon: Willis Modena, MD;  Location: [facility] ENDOSCOPY;  Service: Endoscopy;  Laterality: Left;   COLONOSCOPY N/A 06/01/2020   Procedure: COLONOSCOPY;  Surgeon: Kerin Salen, MD;  Location: [facility] ENDOSCOPY;  Service: Gastroenterology;  Laterality: N/A;   COLONOSCOPY WITH ESOPHAGOGASTRODUODENOSCOPY (EGD)     COLONOSCOPY WITH PROPOFOL N/A 05/14/2021   Procedure: COLONOSCOPY WITH PROPOFOL;  Surgeon: Kerin Salen, MD;  Location: [facility] ENDOSCOPY;  Service: Gastroenterology;  Laterality: N/A;   ERCP N/A 08/19/2021   Procedure: ENDOSCOPIC RETROGRADE CHOLANGIOPANCREATOGRAPHY (ERCP);  Surgeon: Vida Rigger, MD;  Location: [facility] ENDOSCOPY;  Service: Gastroenterology;  Laterality: N/A;  at 1:30 pm   ESOPHAGOGASTRODUODENOSCOPY N/A 05/25/2020   Procedure: ESOPHAGOGASTRODUODENOSCOPY (EGD);  Surgeon: Charlott Rakes, MD;  Location: [facility] ENDOSCOPY;  Service: Endoscopy;  Laterality: N/A;   ESOPHAGOGASTRODUODENOSCOPY N/A 08/19/2021   Procedure: ESOPHAGOGASTRODUODENOSCOPY (EGD);  Surgeon: Kathi Der, MD;  Location: [facility] ENDOSCOPY;  Service:  Gastroenterology;  Laterality: N/A;   ESOPHAGOGASTRODUODENOSCOPY (EGD) WITH PROPOFOL Left 04/16/2019   Procedure: ESOPHAGOGASTRODUODENOSCOPY (EGD) WITH PROPOFOL;  Surgeon: Willis Modena, MD;  Location: [facility] ENDOSCOPY;  Service: Endoscopy;  Laterality: Left;   ESOPHAGOGASTRODUODENOSCOPY (EGD) WITH PROPOFOL N/A 08/23/2021   Procedure: ESOPHAGOGASTRODUODENOSCOPY (EGD) WITH PROPOFOL;  Surgeon: Kerin Salen, MD;  Location: [facility] ENDOSCOPY;  Service: Gastroenterology;  Laterality: N/A;   FLEXIBLE SIGMOIDOSCOPY N/A 05/25/2020   Procedure: FLEXIBLE SIGMOIDOSCOPY;  Surgeon: Charlott Rakes, MD;  Location: [facility] ENDOSCOPY;  Service: Endoscopy;  Laterality: N/A;   HEMOSTASIS CONTROL  08/19/2021   Procedure: HEMOSTASIS CONTROL;  Surgeon: Kathi Der, MD;  Location: [facility] ENDOSCOPY;  Service: Gastroenterology;;   IR ANGIOGRAM SELECTIVE EACH ADDITIONAL VESSEL  05/26/2021   IR ANGIOGRAM SELECTIVE EACH ADDITIONAL VESSEL  08/20/2021   IR ANGIOGRAM SELECTIVE EACH ADDITIONAL VESSEL  08/20/2021   IR ANGIOGRAM VISCERAL SELECTIVE  05/26/2021   IR ANGIOGRAM VISCERAL SELECTIVE  08/20/2021   IR EMBO ART  VEN HEMORR LYMPH EXTRAV  INC GUIDE ROADMAPPING  05/26/2021   IR EMBO ART  VEN HEMORR LYMPH EXTRAV  INC GUIDE ROADMAPPING  08/20/2021   IR FLUORO GUIDE CV LINE RIGHT  08/20/2021   IR THORACENTESIS ASP PLEURAL SPACE W/IMG GUIDE  10/23/2016   IR US GUIDE VASC ACCESS LEFT  05/26/2021   IR US GUIDE VASC ACCESS RIGHT  05/26/2021   IR US GUIDE VASC ACCESS RIGHT  08/20/2021   IR US GUIDE VASC ACCESS RIGHT  08/20/2021   MICROLARYNGOSCOPY Right 09/20/2017   Procedure: MICROLARYNGOSCOPY WITH  EXCISION OF VOCAL CORD LESION;  Surgeon: Newman Pies, MD;  Location: [facility];  Service: ENT;  Laterality: Right;   MULTIPLE EXTRACTIONS WITH ALVEOLOPLASTY N/A 06/10/2016   Procedure: Extraction of tooth #'s 2,8,13,15, and 29  with alveoloplasty, maxillary right and left buccal exostoses reductions, and gross debridement of remaining teeth.;   Surgeon: Charlynne Pander, DDS;  Location: [facility] OR;  Service: Oral Surgery;  Laterality: N/A;   POLYPECTOMY  05/14/2021   Procedure: POLYPECTOMY;  Surgeon: Kerin Salen, MD;  Location: [facility] ENDOSCOPY;  Service: Gastroenterology;;   REMOVAL OF STONES  08/19/2021   Procedure: REMOVAL OF STONES;  Surgeon: Vida Rigger, MD;  Location: [facility] ENDOSCOPY;  Service: Gastroenterology;;   RIGHT/LEFT HEART CATH AND CORONARY ANGIOGRAPHY N/A 04/16/2016   Procedure: Right/Left Heart Cath and Coronary Angiography;  Surgeon: [name] Swaziland, MD;  Location: [facility] INVASIVE CV LAB;  Service: Cardiovascular;  Laterality: N/A;   SPHINCTEROTOMY  08/19/2021   Procedure: SPHINCTEROTOMY;  Surgeon: Vida Rigger, MD;  Location: [facility] ENDOSCOPY;  Service: Gastroenterology;;   TEE WITHOUT CARDIOVERSION N/A 09/15/2016   Procedure: TRANSESOPHAGEAL ECHOCARDIOGRAM (TEE);  Surgeon: Delight Ovens, MD;  Location: [facility] OR;  Service: Open Heart Surgery;  Laterality: N/A;    Short Social History:  Social History   Tobacco Use   Smoking status: Former    Current packs/day: 1.00    Average packs/day: 1 pack/day for 25.0 years (25.0 ttl pk-yrs)    Types: Cigarettes   Smokeless tobacco: Never  Substance Use Topics   Alcohol use: No    Alcohol/week: 0.0 standard drinks of alcohol    No Known Allergies  Current Outpatient Medications  Medication Sig Dispense Refill   acetaminophen (TYLENOL) 500 MG tablet Take 1 tablet (500 mg total) by mouth every 8 (eight) hours as needed for fever (pain).     amLODipine (NORVASC) 10 MG tablet Take 10 mg by mouth daily.     aspirin 81 MG chewable tablet Chew 1 tablet (81 mg total) by mouth daily.     atenolol (TENORMIN) 25 MG tablet Take 12.5 mg by mouth 2 (two) times daily. 180 tablet 3   fluticasone (FLONASE) 50 MCG/ACT nasal spray Place 2 sprays into both nostrils daily as needed for allergies or rhinitis.     latanoprost (XALATAN) 0.005 % ophthalmic solution Place 1 drop into both eyes at bedtime.      Multiple Vitamin (MULTIVITAMIN WITH MINERALS) TABS tablet Take 1 tablet by mouth daily.     omega-3 acid ethyl esters (LOVAZA) 1 g capsule Take 2 g by mouth 2 (two) times daily.     pantoprazole (PROTONIX) 40 MG tablet Take 40 mg by mouth 2 (two) times daily.     polyethylene glycol (MIRALAX / GLYCOLAX) 17 g packet Take 17 g by mouth daily as needed for mild constipation. Every other day     psyllium (HYDROCIL/METAMUCIL) 95 % PACK Take 1 packet by mouth daily as needed for mild constipation.     rosuvastatin (CRESTOR) 20 MG tablet Take 20 mg by mouth daily.     sucralfate (CARAFATE) 1 g tablet Take 1 tablet (1 g total) by mouth with breakfast, with lunch, and with evening meal for 7 days. 21 tablet 0   tamsulosin (FLOMAX) 0.4 MG CAPS capsule Take 0.4 mg by mouth daily.     triamcinolone (KENALOG) 0.025 % cream Apply 1 Application topically daily as needed (skin irritation/rash.).     No current facility-administered medications for this visit.    Review of Systems  Constitutional:  Constitutional negative. HENT: HENT negative.  Eyes: Eyes negative.  Respiratory: Respiratory negative.  Cardiovascular: Cardiovascular negative.  GI: Gastrointestinal negative.  Musculoskeletal: Positive for back pain and gait problem.  Skin: Skin negative.  Hematologic: Hematologic/lymphatic negative.  Psychiatric: Psychiatric negative.        Objective:  Objective   Vitals:   09/09/22 1210  BP: (!) 147/86  Pulse: (!) 58  Resp: 18  Temp: 97.7 F (36.5 C)  SpO2: 96%    Physical Exam HENT:     Head: Normocephalic.     Nose: Nose normal.  Eyes:     Pupils: Pupils are equal, round, and reactive to light.  Cardiovascular:     Rate and Rhythm: Normal rate.     Pulses:          Femoral pulses are 2+ on the right side and 2+ on the left side.      Popliteal pulses are 3+ on the right side and 3+ on the left side.  Pulmonary:     Effort: Pulmonary effort is normal.  Abdominal:     General:  Abdomen is flat.  Musculoskeletal:     Right lower leg: No edema.     Left lower leg: No edema.  Skin:    General: Skin is warm.     Capillary Refill: Capillary refill takes less than 2 seconds.  Neurological:     General: No focal deficit present.  Mental Status: He is alert. Mental status is at baseline.  Psychiatric:        Mood and Affect: Mood normal.        Thought Content: Thought content normal.        Judgment: Judgment normal.     Data: CT IMPRESSION: 1. No acute abnormality or explanation for abdominal pain. 2. Colonic diverticulosis without acute diverticulitis. 3. Aorto bi-iliac endograft repair of abdominal aortic aneurysm. The aneurysm sac measures 5.5 cm, unchanged from prior exam. 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     Assessment/Plan:    72 year old male with stable but large left internal iliac artery aneurysm.  I have recommended repair and given that his IMA is occluded as well as his right internal iliac artery recommended attempt at salvage with stenting of the hypogastric and possibly would require coiling of the sidebranch.  We will get this scheduled on a Monday in the near future and will need to discuss with Dr. Myna Hidalgo given his history of ITP although his most recent platelet count was adequate.  Will also need to evaluate his bilateral popliteal arteries in the future  particularly the right side given that it was near 2 cm at last evaluation 1 year ago.  All questions answered today.       Maeola Harman MD [facility]

## 2022-09-09 NOTE — Progress Notes (Signed)
Patient ID: Lonnie Hansen, male   DOB: December 30, 1950, 72 y.o.   MRN: [id number]  Reason for Consult: No chief complaint on file.   Referred by Lonnie Fillers, MD  Subjective:     HPI:  Lonnie Hansen is a 72 y.o. 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  He does have some back pain which is chronic.  Recently was evaluated with CT scan for abdominal pain with no acute findings.  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 states  it's a" growth on vocal cord"   Family History  Problem Relation Age of Onset   Lymphoma Mother    Heart disease Father    Heart attack Father    Thyroid disease Sister    Past Surgical History:  Procedure Laterality Date   ABDOMINAL AORTIC ENDOVASCULAR STENT GRAFT N/A 11/28/2021   Procedure: ABDOMINAL AORTIC ENDOVASCULAR STENT GRAFT;  Surgeon: Maeola Harman, MD;  Location: [facility] OR;  Service: Vascular;  Laterality: N/A;   ANEURYSM COILING Right 11/28/2021   Procedure: ANEURYSM COILING RIGHT INTERNAL ILIAC;  Surgeon: Maeola Harman, MD;  Location: [facility] OR;  Service: Vascular;  Laterality: Right;   AORTIC ARCH ANGIOGRAPHY N/A 04/16/2016    Procedure: Aortic Arch Angiography;  Surgeon: [name] Swaziland, MD;  Location: [facility] INVASIVE CV LAB;  Service: Cardiovascular;  Laterality: N/A;   AORTIC VALVE REPLACEMENT N/A 09/15/2016   Procedure: AORTIC VALVE REPLACEMENT (AVR);  Surgeon: Delight Ovens, MD;  Location: [facility] OR;  Service: Open Heart Surgery;  Laterality: N/A;  Using 29mm Edwards Perimount Magna Ease Aortic Bioprosthesis Valve   ASCENDING AORTIC ROOT REPLACEMENT N/A 09/15/2016   Procedure: ASCENDING AORTIC ROOT REPLACEMENT;  Surgeon: Delight Ovens, MD;  Location: [facility] OR;  Service: Open Heart Surgery;  Laterality: N/A;  Using 32mm Gelweave Valsalva Graft   CHOLECYSTECTOMY N/A 12/19/2020   Procedure: LAPAROSCOPIC CHOLECYSTECTOMY WITH POSSIBLE INTRAOPERATIVE CHOLANGIOGRAM;  Surgeon: Gaynelle Adu, MD;  Location: [facility] OR;  Service: General;  Laterality: N/A;   COLONOSCOPY     COLONOSCOPY Left 04/16/2019   Procedure: COLONOSCOPY;  Surgeon: Willis Modena, MD;  Location: [facility] ENDOSCOPY;  Service: Endoscopy;  Laterality: Left;   COLONOSCOPY N/A 06/01/2020   Procedure: COLONOSCOPY;  Surgeon: Kerin Salen, MD;  Location: [facility] ENDOSCOPY;  Service: Gastroenterology;  Laterality: N/A;   COLONOSCOPY WITH ESOPHAGOGASTRODUODENOSCOPY (EGD)     COLONOSCOPY WITH PROPOFOL N/A 05/14/2021   Procedure: COLONOSCOPY WITH PROPOFOL;  Surgeon: Kerin Salen, MD;  Location: [facility] ENDOSCOPY;  Service: Gastroenterology;  Laterality: N/A;   ERCP N/A 08/19/2021   Procedure: ENDOSCOPIC RETROGRADE CHOLANGIOPANCREATOGRAPHY (ERCP);  Surgeon: Vida Rigger, MD;  Location: [facility] ENDOSCOPY;  Service: Gastroenterology;  Laterality: N/A;  at 1:30 pm   ESOPHAGOGASTRODUODENOSCOPY N/A 05/25/2020   Procedure: ESOPHAGOGASTRODUODENOSCOPY (EGD);  Surgeon: Charlott Rakes, MD;  Location: [facility] ENDOSCOPY;  Service: Endoscopy;  Laterality: N/A;   ESOPHAGOGASTRODUODENOSCOPY N/A 08/19/2021   Procedure: ESOPHAGOGASTRODUODENOSCOPY (EGD);  Surgeon: Kathi Der, MD;  Location: [facility] ENDOSCOPY;  Service:  Gastroenterology;  Laterality: N/A;   ESOPHAGOGASTRODUODENOSCOPY (EGD) WITH PROPOFOL Left 04/16/2019   Procedure: ESOPHAGOGASTRODUODENOSCOPY (EGD) WITH PROPOFOL;  Surgeon: Willis Modena, MD;  Location: [facility] ENDOSCOPY;  Service: Endoscopy;  Laterality: Left;   ESOPHAGOGASTRODUODENOSCOPY (EGD) WITH PROPOFOL N/A 08/23/2021   Procedure: ESOPHAGOGASTRODUODENOSCOPY (EGD) WITH PROPOFOL;  Surgeon: Kerin Salen, MD;  Location: [facility] ENDOSCOPY;  Service: Gastroenterology;  Laterality: N/A;   FLEXIBLE SIGMOIDOSCOPY N/A 05/25/2020   Procedure: FLEXIBLE SIGMOIDOSCOPY;  Surgeon: Charlott Rakes, MD;  Location: [facility] ENDOSCOPY;  Service: Endoscopy;  Laterality: N/A;   HEMOSTASIS CONTROL  08/19/2021   Procedure: HEMOSTASIS CONTROL;  Surgeon: Kathi Der, MD;  Location: [facility] ENDOSCOPY;  Service: Gastroenterology;;   IR ANGIOGRAM SELECTIVE EACH ADDITIONAL VESSEL  05/26/2021   IR ANGIOGRAM SELECTIVE EACH ADDITIONAL VESSEL  08/20/2021   IR ANGIOGRAM SELECTIVE EACH ADDITIONAL VESSEL  08/20/2021   IR ANGIOGRAM VISCERAL SELECTIVE  05/26/2021   IR ANGIOGRAM VISCERAL SELECTIVE  08/20/2021   IR EMBO ART  VEN HEMORR LYMPH EXTRAV  INC GUIDE ROADMAPPING  05/26/2021   IR EMBO ART  VEN HEMORR LYMPH EXTRAV  INC GUIDE ROADMAPPING  08/20/2021   IR FLUORO GUIDE CV LINE RIGHT  08/20/2021   IR THORACENTESIS ASP PLEURAL SPACE W/IMG GUIDE  10/23/2016   IR US GUIDE VASC ACCESS LEFT  05/26/2021   IR US GUIDE VASC ACCESS RIGHT  05/26/2021   IR US GUIDE VASC ACCESS RIGHT  08/20/2021   IR US GUIDE VASC ACCESS RIGHT  08/20/2021   MICROLARYNGOSCOPY Right 09/20/2017   Procedure: MICROLARYNGOSCOPY WITH  EXCISION OF VOCAL CORD LESION;  Surgeon: Newman Pies, MD;  Location: [facility];  Service: ENT;  Laterality: Right;   MULTIPLE EXTRACTIONS WITH ALVEOLOPLASTY N/A 06/10/2016   Procedure: Extraction of tooth #'s 2,8,13,15, and 29  with alveoloplasty, maxillary right and left buccal exostoses reductions, and gross debridement of remaining teeth.;   Surgeon: Charlynne Pander, DDS;  Location: [facility] OR;  Service: Oral Surgery;  Laterality: N/A;   POLYPECTOMY  05/14/2021   Procedure: POLYPECTOMY;  Surgeon: Kerin Salen, MD;  Location: [facility] ENDOSCOPY;  Service: Gastroenterology;;   REMOVAL OF STONES  08/19/2021   Procedure: REMOVAL OF STONES;  Surgeon: Vida Rigger, MD;  Location: [facility] ENDOSCOPY;  Service: Gastroenterology;;   RIGHT/LEFT HEART CATH AND CORONARY ANGIOGRAPHY N/A 04/16/2016   Procedure: Right/Left Heart Cath and Coronary Angiography;  Surgeon: [name] Swaziland, MD;  Location: [facility] INVASIVE CV LAB;  Service: Cardiovascular;  Laterality: N/A;   SPHINCTEROTOMY  08/19/2021   Procedure: SPHINCTEROTOMY;  Surgeon: Vida Rigger, MD;  Location: [facility] ENDOSCOPY;  Service: Gastroenterology;;   TEE WITHOUT CARDIOVERSION N/A 09/15/2016   Procedure: TRANSESOPHAGEAL ECHOCARDIOGRAM (TEE);  Surgeon: Delight Ovens, MD;  Location: [facility] OR;  Service: Open Heart Surgery;  Laterality: N/A;    Short Social History:  Social History   Tobacco Use   Smoking status: Former    Current packs/day: 1.00    Average packs/day: 1 pack/day for 25.0 years (25.0 ttl pk-yrs)    Types: Cigarettes   Smokeless tobacco: Never  Substance Use Topics   Alcohol use: No    Alcohol/week: 0.0 standard drinks of alcohol    No Known Allergies  Current Outpatient Medications  Medication Sig Dispense Refill   acetaminophen (TYLENOL) 500 MG tablet Take 1 tablet (500 mg total) by mouth every 8 (eight) hours as needed for fever (pain).     amLODipine (NORVASC) 10 MG tablet Take 10 mg by mouth daily.     aspirin 81 MG chewable tablet Chew 1 tablet (81 mg total) by mouth daily.     atenolol (TENORMIN) 25 MG tablet Take 12.5 mg by mouth 2 (two) times daily. 180 tablet 3   fluticasone (FLONASE) 50 MCG/ACT nasal spray Place 2 sprays into both nostrils daily as needed for allergies or rhinitis.     latanoprost (XALATAN) 0.005 % ophthalmic solution Place 1 drop into both eyes at bedtime.      Multiple Vitamin (MULTIVITAMIN WITH MINERALS) TABS tablet Take 1 tablet by mouth daily.     omega-3 acid ethyl esters (LOVAZA) 1 g capsule Take 2 g by mouth 2 (two) times daily.     pantoprazole (PROTONIX) 40 MG tablet Take 40 mg by mouth 2 (two) times daily.     polyethylene glycol (MIRALAX / GLYCOLAX) 17 g packet Take 17 g by mouth daily as needed for mild constipation. Every other day     psyllium (HYDROCIL/METAMUCIL) 95 % PACK Take 1 packet by mouth daily as needed for mild constipation.     rosuvastatin (CRESTOR) 20 MG tablet Take 20 mg by mouth daily.     sucralfate (CARAFATE) 1 g tablet Take 1 tablet (1 g total) by mouth with breakfast, with lunch, and with evening meal for 7 days. 21 tablet 0   tamsulosin (FLOMAX) 0.4 MG CAPS capsule Take 0.4 mg by mouth daily.     triamcinolone (KENALOG) 0.025 % cream Apply 1 Application topically daily as needed (skin irritation/rash.).     No current facility-administered medications for this visit.    Review of Systems  Constitutional:  Constitutional negative. HENT: HENT negative.  Eyes: Eyes negative.  Respiratory: Respiratory negative.  Cardiovascular: Cardiovascular negative.  GI: Gastrointestinal negative.  Musculoskeletal: Positive for back pain and gait problem.  Skin: Skin negative.  Hematologic: Hematologic/lymphatic negative.  Psychiatric: Psychiatric negative.        Objective:  Objective   Vitals:   09/09/22 1210  BP: (!) 147/86  Pulse: (!) 58  Resp: 18  Temp: 97.7 F (36.5 C)  SpO2: 96%    Physical Exam HENT:     Head: Normocephalic.     Nose: Nose normal.  Eyes:     Pupils: Pupils are equal, round, and reactive to light.  Cardiovascular:     Rate and Rhythm: Normal rate.     Pulses:          Femoral pulses are 2+ on the right side and 2+ on the left side.      Popliteal pulses are 3+ on the right side and 3+ on the left side.  Pulmonary:     Effort: Pulmonary effort is normal.  Abdominal:     General:  Abdomen is flat.  Musculoskeletal:     Right lower leg: No edema.     Left lower leg: No edema.  Skin:    General: Skin is warm.     Capillary Refill: Capillary refill takes less than 2 seconds.  Neurological:     General: No focal deficit present.  Mental Status: He is alert. Mental status is at baseline.  Psychiatric:        Mood and Affect: Mood normal.        Thought Content: Thought content normal.        Judgment: Judgment normal.     Data: CT IMPRESSION: 1. No acute abnormality or explanation for abdominal pain. 2. Colonic diverticulosis without acute diverticulitis. 3. Aorto bi-iliac endograft repair of abdominal aortic aneurysm. The aneurysm sac measures 5.5 cm, unchanged from prior exam. 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     Assessment/Plan:    72 year old male with stable but large left internal iliac artery aneurysm.  I have recommended repair and given that his IMA is occluded as well as his right internal iliac artery recommended attempt at salvage with stenting of the hypogastric and possibly would require coiling of the sidebranch.  We will get this scheduled on a Monday in the near future and will need to discuss with Dr. Myna Hidalgo given his history of ITP although his most recent platelet count was adequate.  Will also need to evaluate his bilateral popliteal arteries in the future  particularly the right side given that it was near 2 cm at last evaluation 1 year ago.  All questions answered today.       Maeola Harman MD [facility]

## 2022-09-10 DIAGNOSIS — H35033 Hypertensive retinopathy, bilateral: Secondary | ICD-10-CM | POA: Diagnosis not present

## 2022-09-10 DIAGNOSIS — H401231 Low-tension glaucoma, bilateral, mild stage: Secondary | ICD-10-CM | POA: Diagnosis not present

## 2022-09-10 DIAGNOSIS — H43812 Vitreous degeneration, left eye: Secondary | ICD-10-CM | POA: Diagnosis not present

## 2022-09-10 DIAGNOSIS — H04123 Dry eye syndrome of bilateral lacrimal glands: Secondary | ICD-10-CM | POA: Diagnosis not present

## 2022-09-11 ENCOUNTER — Other Ambulatory Visit: Payer: Self-pay | Admitting: *Deleted

## 2022-09-11 DIAGNOSIS — D693 Immune thrombocytopenic purpura: Secondary | ICD-10-CM

## 2022-09-14 ENCOUNTER — Encounter: Payer: Self-pay | Admitting: Hematology & Oncology

## 2022-09-14 ENCOUNTER — Inpatient Hospital Stay: Payer: Medicare Other

## 2022-09-14 ENCOUNTER — Inpatient Hospital Stay (HOSPITAL_BASED_OUTPATIENT_CLINIC_OR_DEPARTMENT_OTHER): Payer: Medicare Other | Admitting: Hematology & Oncology

## 2022-09-14 VITALS — BP 132/71 | HR 57 | Temp 99.0°F | Resp 20 | Ht 69.0 in | Wt 188.1 lb

## 2022-09-14 DIAGNOSIS — Z79899 Other long term (current) drug therapy: Secondary | ICD-10-CM | POA: Diagnosis not present

## 2022-09-14 DIAGNOSIS — D693 Immune thrombocytopenic purpura: Secondary | ICD-10-CM

## 2022-09-14 DIAGNOSIS — Z8616 Personal history of COVID-19: Secondary | ICD-10-CM | POA: Diagnosis not present

## 2022-09-14 LAB — CBC WITH DIFFERENTIAL (CANCER CENTER ONLY)
Abs Immature Granulocytes: 0.07 10*3/uL (ref 0.00–0.07)
Basophils Absolute: 0 10*3/uL (ref 0.0–0.1)
Basophils Relative: 0 %
Eosinophils Absolute: 0.1 10*3/uL (ref 0.0–0.5)
Eosinophils Relative: 2 %
HCT: 33.2 % — ABNORMAL LOW (ref 39.0–52.0)
Hemoglobin: 10.7 g/dL — ABNORMAL LOW (ref 13.0–17.0)
Immature Granulocytes: 1 %
Lymphocytes Relative: 15 %
Lymphs Abs: 1 10*3/uL (ref 0.7–4.0)
MCH: 30.7 pg (ref 26.0–34.0)
MCHC: 32.2 g/dL (ref 30.0–36.0)
MCV: 95.4 fL (ref 80.0–100.0)
Monocytes Absolute: 0.8 10*3/uL (ref 0.1–1.0)
Monocytes Relative: 11 %
Neutro Abs: 4.8 10*3/uL (ref 1.7–7.7)
Neutrophils Relative %: 71 %
Platelet Count: 86 10*3/uL — ABNORMAL LOW (ref 150–400)
RBC: 3.48 MIL/uL — ABNORMAL LOW (ref 4.22–5.81)
RDW: 13.4 % (ref 11.5–15.5)
WBC Count: 6.9 10*3/uL (ref 4.0–10.5)
nRBC: 0 % (ref 0.0–0.2)

## 2022-09-14 LAB — CMP (CANCER CENTER ONLY)
ALT: 7 U/L (ref 0–44)
AST: 16 U/L (ref 15–41)
Albumin: 4.5 g/dL (ref 3.5–5.0)
Alkaline Phosphatase: 38 U/L (ref 38–126)
Anion gap: 9 (ref 5–15)
BUN: 16 mg/dL (ref 8–23)
CO2: 27 mmol/L (ref 22–32)
Calcium: 9.3 mg/dL (ref 8.9–10.3)
Chloride: 103 mmol/L (ref 98–111)
Creatinine: 1.52 mg/dL — ABNORMAL HIGH (ref 0.61–1.24)
GFR, Estimated: 48 mL/min — ABNORMAL LOW (ref >60)
Glucose, Bld: 111 mg/dL — ABNORMAL HIGH (ref 70–99)
Potassium: 4.3 mmol/L (ref 3.5–5.1)
Sodium: 139 mmol/L (ref 135–145)
Total Bilirubin: 0.6 mg/dL (ref 0.3–1.2)
Total Protein: 7.4 g/dL (ref 6.5–8.1)

## 2022-09-14 MED ORDER — ROMIPLOSTIM 125 MCG ~~LOC~~ SOLR
95.0000 ug | Freq: Once | SUBCUTANEOUS | Status: AC
  Administered 2022-09-14: 95 ug via SUBCUTANEOUS
  Filled 2022-09-14: qty 0.19

## 2022-09-14 NOTE — Progress Notes (Signed)
Hematology and Oncology Follow Up Visit  Lonnie Hansen [id number] July 03, 1950 72 y.o. 09/14/2022   Principle Diagnosis:  Chronic ITP   Current Therapy:        Nplate to keep platelet count over 100,000   Interim History:  Lonnie Hansen is here today for follow-up.  Unfortunately, looks like is going to have surgery.  It sounds like he has a large left internal iliac artery aneurysm.  He sees Dr. Randie Heinz of Vascular Surgery.  I am not sure when surgery is planned.  We will have to make sure that his platelet count is going to be adequate.  Unfortunately, he had COVID about 3 weeks ago.  He was treated for this.  He is not sure what kind of antibiotic he was put on.  He feels okay right now.  There is no bleeding.  There is no bruising.  He has had no rashes.  He has had no change in bowel or bladder habits.  He has had no leg weakness.  There is been no leg pain.    Overall, I would say that his performance status is probably ECOG 1.   Wt Readings from Last 3 Encounters:  09/14/22 188 lb 1.3 oz (85.3 kg)  09/09/22 188 lb 12.8 oz (85.6 kg)  09/08/22 184 lb (83.5 kg)    Medications:  Allergies as of 09/14/2022   No Known Allergies      Medication List        Accurate as of September 14, 2022  1:27 PM. If you have any questions, ask your nurse or doctor.          acetaminophen 500 MG tablet Commonly known as: TYLENOL Take 1 tablet (500 mg total) by mouth every 8 (eight) hours as needed for fever (pain).   amLODipine 10 MG tablet Commonly known as: NORVASC Take 10 mg by mouth daily.   aspirin 81 MG chewable tablet Chew 1 tablet (81 mg total) by mouth daily.   atenolol 25 MG tablet Commonly known as: TENORMIN Take 12.5 mg by mouth 2 (two) times daily.   fluticasone 50 MCG/ACT nasal spray Commonly known as: FLONASE Place 2 sprays into both nostrils daily as needed for allergies or rhinitis.   latanoprost 0.005 % ophthalmic solution Commonly known as: XALATAN Place 1  drop into both eyes at bedtime.   losartan-hydrochlorothiazide 100-12.5 MG tablet Commonly known as: HYZAAR Take 1 tablet by mouth daily.   multivitamin with minerals Tabs tablet Take 1 tablet by mouth daily.   omega-3 acid ethyl esters 1 g capsule Commonly known as: LOVAZA Take 2 g by mouth 2 (two) times daily.   pantoprazole 40 MG tablet Commonly known as: PROTONIX Take 40 mg by mouth 2 (two) times daily.   polyethylene glycol 17 g packet Commonly known as: MIRALAX / GLYCOLAX Take 17 g by mouth daily as needed for mild constipation. Every other day   psyllium 95 % Pack Commonly known as: HYDROCIL/METAMUCIL Take 1 packet by mouth daily as needed for mild constipation.   rosuvastatin 20 MG tablet Commonly known as: CRESTOR Take 20 mg by mouth daily.   sucralfate 1 g tablet Commonly known as: Carafate Take 1 tablet (1 g total) by mouth with breakfast, with lunch, and with evening meal for 7 days.   tamsulosin 0.4 MG Caps capsule Commonly known as: FLOMAX Take 0.4 mg by mouth daily.   triamcinolone 0.025 % cream Commonly known as: KENALOG Apply 1 Application topically daily as needed (  skin irritation/rash.).        Allergies: No Known Allergies  Past Medical History, Surgical history, Social history, and Family History were reviewed and updated.  Review of Systems: All other 10 point review of systems is negative.   Physical Exam:  height is 5\' 9"  (1.753 m) and weight is 188 lb 1.3 oz (85.3 kg). His oral temperature is 99 F (37.2 C). His blood pressure is 132/71 and his pulse is 57 (abnormal). His respiration is 20 and oxygen saturation is 99%.   Wt Readings from Last 3 Encounters:  09/14/22 188 lb 1.3 oz (85.3 kg)  09/09/22 188 lb 12.8 oz (85.6 kg)  09/08/22 184 lb (83.5 kg)    Physical Exam Vitals reviewed.  HENT:     Head: Normocephalic and atraumatic.  Eyes:     Pupils: Pupils are equal, round, and reactive to light.  Cardiovascular:     Rate  and Rhythm: Normal rate and regular rhythm.     Heart sounds: Normal heart sounds.  Pulmonary:     Effort: Pulmonary effort is normal.     Breath sounds: Normal breath sounds.  Abdominal:     General: Bowel sounds are normal.     Palpations: Abdomen is soft.  Musculoskeletal:        General: No tenderness or deformity. Normal range of motion.     Cervical back: Normal range of motion.  Lymphadenopathy:     Cervical: No cervical adenopathy.  Skin:    General: Skin is warm and dry.     Findings: No erythema or rash.  Neurological:     Mental Status: He is alert and oriented to person, place, and time.  Psychiatric:        Behavior: Behavior normal.        Thought Content: Thought content normal.        Judgment: Judgment normal.      Lab Results  Component Value Date   WBC 6.9 09/14/2022   HGB 10.7 (L) 09/14/2022   HCT 33.2 (L) 09/14/2022   MCV 95.4 09/14/2022   PLT 86 (L) 09/14/2022   Lab Results  Component Value Date   FERRITIN 28 10/31/2021   IRON 91 10/31/2021   TIBC 419 10/31/2021   UIBC 328 10/31/2021   IRONPCTSAT 22 10/31/2021   Lab Results  Component Value Date   RETICCTPCT 2.1 10/31/2021   RBC 3.48 (L) 09/14/2022   No results found for: "KPAFRELGTCHN", "LAMBDASER", "KAPLAMBRATIO" No results found for: "IGGSERUM", "IGA", "IGMSERUM" No results found for: "TOTALPROTELP", "ALBUMINELP", "A1GS", "A2GS", "BETS", "BETA2SER", "GAMS", "MSPIKE", "SPEI"   Chemistry      Component Value Date/Time   NA 139 09/14/2022 1204   NA 140 05/11/2019 0946   K 4.3 09/14/2022 1204   CL 103 09/14/2022 1204   CO2 27 09/14/2022 1204   BUN 16 09/14/2022 1204   BUN 17 05/11/2019 0946   CREATININE 1.52 (H) 09/14/2022 1204   CREATININE 0.75 04/10/2016 1132      Component Value Date/Time   CALCIUM 9.3 09/14/2022 1204   ALKPHOS 38 09/14/2022 1204   AST 16 09/14/2022 1204   ALT 7 09/14/2022 1204   BILITOT 0.6 09/14/2022 1204       Impression and Plan: Lonnie Hansen is a very  pleasant 72 yo African American gentleman with chronic ITP.   Thankfully, the ITP is not all that bad.  I am sure we can get his platelet count high enough so he can have surgery safely  for this aneurysm.  We will have to get hold of Dr. Randie Heinz and find out when he is going to have surgery.  Will go ahead and give him a dose of Nplate today.  He will have to come back weekly for right now until we know when surgery will be.  I will plan to see him back myself in about 1 month.   Josph Macho, MD 8/19/20241:27 PM Get

## 2022-09-14 NOTE — Patient Instructions (Signed)
Romiplostim Injection What is this medication? ROMIPLOSTIM (roe mi PLOE stim) treats low levels of platelets in your body caused by immune thrombocytopenia (ITP). It is prescribed when other medications have not worked or cannot be tolerated. It may also be used to help people who have been exposed to high doses of radiation. It works by increasing the amount of platelets in your blood. This lowers the risk of bleeding. This medicine may be used for other purposes; ask your health care provider or pharmacist if you have questions. COMMON BRAND NAME(S): Nplate What should I tell my care team before I take this medication? They need to know if you have any of these conditions: Blood clots Myelodysplastic syndrome An unusual or allergic reaction to romiplostim, mannitol, other medications, foods, dyes, or preservatives Pregnant or trying to get pregnant Breast-feeding How should I use this medication? This medication is injected under the skin. It is given by a care team in a hospital or clinic setting. A special MedGuide will be given to you before each treatment. Be sure to read this information carefully each time. Talk to your care team about the use of this medication in children. While it may be prescribed for children as young as newborns for selected conditions, precautions do apply. Overdosage: If you think you have taken too much of this medicine contact a poison control center or emergency room at once. NOTE: This medicine is only for you. Do not share this medicine with others. What if I miss a dose? Keep appointments for follow-up doses. It is important not to miss your dose. Call your care team if you are unable to keep an appointment. What may interact with this medication? Interactions are not expected. This list may not describe all possible interactions. Give your health care provider a list of all the medicines, herbs, non-prescription drugs, or dietary supplements you use. Also  tell them if you smoke, drink alcohol, or use illegal drugs. Some items may interact with your medicine. What should I watch for while using this medication? Visit your care team for regular checks on your progress. You may need blood work done while you are taking this medication. Your condition will be monitored carefully while you are receiving this medication. It is important not to miss any appointments. What side effects may I notice from receiving this medication? Side effects that you should report to your care team as soon as possible: Allergic reactions--skin rash, itching, hives, swelling of the face, lips, tongue, or throat Blood clot--pain, swelling, or warmth in the leg, shortness of breath, chest pain Side effects that usually do not require medical attention (report to your care team if they continue or are bothersome): Dizziness Joint pain Muscle pain Pain in the hands or feet Stomach pain Trouble sleeping This list may not describe all possible side effects. Call your doctor for medical advice about side effects. You may report side effects to FDA at 1-800-FDA-1088. Where should I keep my medication? This medication is given in a hospital or clinic. It will not be stored at home. NOTE: This sheet is a summary. It may not cover all possible information. If you have questions about this medicine, talk to your doctor, pharmacist, or health care provider.  2024 Elsevier/Gold Standard (2021-05-19 00:00:00)  

## 2022-09-15 ENCOUNTER — Telehealth: Payer: Self-pay

## 2022-09-15 ENCOUNTER — Other Ambulatory Visit: Payer: Self-pay

## 2022-09-15 DIAGNOSIS — I723 Aneurysm of iliac artery: Secondary | ICD-10-CM

## 2022-09-15 NOTE — Telephone Encounter (Signed)
Contacted [facility]- spoke with Tiffany regarding coordination on Nplate injection and scheduling aortogram with earliest available dates of 8/26 or 9/9. She reports after speaking with Dr. Myna Hidalgo, he recommends for patient to schedule procedure on 9/9 and this will allow time for injection prior.   Spoke with patient and will schedule aortogram for 9/9 at [facility]. Instructions provided and he voiced understanding and all questions answered.

## 2022-09-15 NOTE — Telephone Encounter (Signed)
-----   Message from Lemar Livings sent at 09/14/2022  1:44 PM EDT ----- Dr. Myna Hidalgo will give patient endplate to increase his platelets prior to our procedure.  We will need to reach out to his office at the cancer center when we have him scheduled.  Apolinar Junes

## 2022-09-21 ENCOUNTER — Inpatient Hospital Stay: Payer: Medicare Other

## 2022-09-21 VITALS — BP 134/76 | HR 57 | Temp 98.7°F | Resp 19

## 2022-09-21 DIAGNOSIS — D693 Immune thrombocytopenic purpura: Secondary | ICD-10-CM

## 2022-09-21 DIAGNOSIS — Z79899 Other long term (current) drug therapy: Secondary | ICD-10-CM | POA: Diagnosis not present

## 2022-09-21 DIAGNOSIS — Z8616 Personal history of COVID-19: Secondary | ICD-10-CM | POA: Diagnosis not present

## 2022-09-21 LAB — CMP (CANCER CENTER ONLY)
ALT: 7 U/L (ref 0–44)
AST: 18 U/L (ref 15–41)
Albumin: 4.8 g/dL (ref 3.5–5.0)
Alkaline Phosphatase: 44 U/L (ref 38–126)
Anion gap: 11 (ref 5–15)
BUN: 19 mg/dL (ref 8–23)
CO2: 26 mmol/L (ref 22–32)
Calcium: 9.7 mg/dL (ref 8.9–10.3)
Chloride: 103 mmol/L (ref 98–111)
Creatinine: 1.72 mg/dL — ABNORMAL HIGH (ref 0.61–1.24)
GFR, Estimated: 42 mL/min — ABNORMAL LOW (ref >60)
Glucose, Bld: 107 mg/dL — ABNORMAL HIGH (ref 70–99)
Potassium: 4.3 mmol/L (ref 3.5–5.1)
Sodium: 140 mmol/L (ref 135–145)
Total Bilirubin: 0.8 mg/dL (ref 0.3–1.2)
Total Protein: 7.6 g/dL (ref 6.5–8.1)

## 2022-09-21 LAB — CBC WITH DIFFERENTIAL (CANCER CENTER ONLY)
Abs Immature Granulocytes: 0.03 10*3/uL (ref 0.00–0.07)
Basophils Absolute: 0 10*3/uL (ref 0.0–0.1)
Basophils Relative: 0 %
Eosinophils Absolute: 0.2 10*3/uL (ref 0.0–0.5)
Eosinophils Relative: 2 %
HCT: 32.9 % — ABNORMAL LOW (ref 39.0–52.0)
Hemoglobin: 10.9 g/dL — ABNORMAL LOW (ref 13.0–17.0)
Immature Granulocytes: 1 %
Lymphocytes Relative: 15 %
Lymphs Abs: 1 10*3/uL (ref 0.7–4.0)
MCH: 31.2 pg (ref 26.0–34.0)
MCHC: 33.1 g/dL (ref 30.0–36.0)
MCV: 94.3 fL (ref 80.0–100.0)
Monocytes Absolute: 0.7 10*3/uL (ref 0.1–1.0)
Monocytes Relative: 11 %
Neutro Abs: 4.6 10*3/uL (ref 1.7–7.7)
Neutrophils Relative %: 71 %
Platelet Count: 85 10*3/uL — ABNORMAL LOW (ref 150–400)
RBC: 3.49 MIL/uL — ABNORMAL LOW (ref 4.22–5.81)
RDW: 13.5 % (ref 11.5–15.5)
WBC Count: 6.5 10*3/uL (ref 4.0–10.5)
nRBC: 0 % (ref 0.0–0.2)

## 2022-09-21 MED ORDER — ROMIPLOSTIM 125 MCG ~~LOC~~ SOLR
95.0000 ug | Freq: Once | SUBCUTANEOUS | Status: AC
  Administered 2022-09-21: 95 ug via SUBCUTANEOUS
  Filled 2022-09-21: qty 0.19

## 2022-09-29 ENCOUNTER — Inpatient Hospital Stay: Payer: Medicare Other | Attending: Hematology & Oncology

## 2022-09-29 ENCOUNTER — Inpatient Hospital Stay: Payer: Medicare Other

## 2022-09-29 ENCOUNTER — Other Ambulatory Visit: Payer: Self-pay

## 2022-09-29 VITALS — BP 122/51 | HR 68 | Temp 98.7°F | Resp 20

## 2022-09-29 DIAGNOSIS — Z79899 Other long term (current) drug therapy: Secondary | ICD-10-CM | POA: Insufficient documentation

## 2022-09-29 DIAGNOSIS — Z7902 Long term (current) use of antithrombotics/antiplatelets: Secondary | ICD-10-CM | POA: Insufficient documentation

## 2022-09-29 DIAGNOSIS — D693 Immune thrombocytopenic purpura: Secondary | ICD-10-CM | POA: Insufficient documentation

## 2022-09-29 LAB — CMP (CANCER CENTER ONLY)
ALT: 11 U/L (ref 0–44)
AST: 22 U/L (ref 15–41)
Albumin: 4.3 g/dL (ref 3.5–5.0)
Alkaline Phosphatase: 43 U/L (ref 38–126)
Anion gap: 10 (ref 5–15)
BUN: 17 mg/dL (ref 8–23)
CO2: 25 mmol/L (ref 22–32)
Calcium: 9.2 mg/dL (ref 8.9–10.3)
Chloride: 101 mmol/L (ref 98–111)
Creatinine: 1.49 mg/dL — ABNORMAL HIGH (ref 0.61–1.24)
GFR, Estimated: 50 mL/min — ABNORMAL LOW (ref >60)
Glucose, Bld: 136 mg/dL — ABNORMAL HIGH (ref 70–99)
Potassium: 3.9 mmol/L (ref 3.5–5.1)
Sodium: 136 mmol/L (ref 135–145)
Total Bilirubin: 0.8 mg/dL (ref 0.3–1.2)
Total Protein: 7.5 g/dL (ref 6.5–8.1)

## 2022-09-29 LAB — CBC WITH DIFFERENTIAL (CANCER CENTER ONLY)
Abs Immature Granulocytes: 0.12 10*3/uL — ABNORMAL HIGH (ref 0.00–0.07)
Basophils Absolute: 0 10*3/uL (ref 0.0–0.1)
Basophils Relative: 0 %
Eosinophils Absolute: 0.1 10*3/uL (ref 0.0–0.5)
Eosinophils Relative: 2 %
HCT: 33.1 % — ABNORMAL LOW (ref 39.0–52.0)
Hemoglobin: 10.8 g/dL — ABNORMAL LOW (ref 13.0–17.0)
Immature Granulocytes: 2 %
Lymphocytes Relative: 16 %
Lymphs Abs: 1.1 10*3/uL (ref 0.7–4.0)
MCH: 30.8 pg (ref 26.0–34.0)
MCHC: 32.6 g/dL (ref 30.0–36.0)
MCV: 94.3 fL (ref 80.0–100.0)
Monocytes Absolute: 0.7 10*3/uL (ref 0.1–1.0)
Monocytes Relative: 11 %
Neutro Abs: 4.8 10*3/uL (ref 1.7–7.7)
Neutrophils Relative %: 69 %
Platelet Count: 134 10*3/uL — ABNORMAL LOW (ref 150–400)
RBC: 3.51 MIL/uL — ABNORMAL LOW (ref 4.22–5.81)
RDW: 13.3 % (ref 11.5–15.5)
WBC Count: 7 10*3/uL (ref 4.0–10.5)
nRBC: 0 % (ref 0.0–0.2)

## 2022-09-29 MED ORDER — ROMIPLOSTIM 125 MCG ~~LOC~~ SOLR
95.0000 ug | Freq: Once | SUBCUTANEOUS | Status: AC
  Administered 2022-09-29: 95 ug via SUBCUTANEOUS
  Filled 2022-09-29: qty 0.19

## 2022-09-29 NOTE — Patient Instructions (Signed)
Romiplostim Injection What is this medication? ROMIPLOSTIM (roe mi PLOE stim) treats low levels of platelets in your body caused by immune thrombocytopenia (ITP). It is prescribed when other medications have not worked or cannot be tolerated. It may also be used to help people who have been exposed to high doses of radiation. It works by increasing the amount of platelets in your blood. This lowers the risk of bleeding. This medicine may be used for other purposes; ask your health care provider or pharmacist if you have questions. COMMON BRAND NAME(S): Nplate What should I tell my care team before I take this medication? They need to know if you have any of these conditions: Blood clots Myelodysplastic syndrome An unusual or allergic reaction to romiplostim, mannitol, other medications, foods, dyes, or preservatives Pregnant or trying to get pregnant Breast-feeding How should I use this medication? This medication is injected under the skin. It is given by a care team in a hospital or clinic setting. A special MedGuide will be given to you before each treatment. Be sure to read this information carefully each time. Talk to your care team about the use of this medication in children. While it may be prescribed for children as young as newborns for selected conditions, precautions do apply. Overdosage: If you think you have taken too much of this medicine contact a poison control center or emergency room at once. NOTE: This medicine is only for you. Do not share this medicine with others. What if I miss a dose? Keep appointments for follow-up doses. It is important not to miss your dose. Call your care team if you are unable to keep an appointment. What may interact with this medication? Interactions are not expected. This list may not describe all possible interactions. Give your health care provider a list of all the medicines, herbs, non-prescription drugs, or dietary supplements you use. Also  tell them if you smoke, drink alcohol, or use illegal drugs. Some items may interact with your medicine. What should I watch for while using this medication? Visit your care team for regular checks on your progress. You may need blood work done while you are taking this medication. Your condition will be monitored carefully while you are receiving this medication. It is important not to miss any appointments. What side effects may I notice from receiving this medication? Side effects that you should report to your care team as soon as possible: Allergic reactions--skin rash, itching, hives, swelling of the face, lips, tongue, or throat Blood clot--pain, swelling, or warmth in the leg, shortness of breath, chest pain Side effects that usually do not require medical attention (report to your care team if they continue or are bothersome): Dizziness Joint pain Muscle pain Pain in the hands or feet Stomach pain Trouble sleeping This list may not describe all possible side effects. Call your doctor for medical advice about side effects. You may report side effects to FDA at 1-800-FDA-1088. Where should I keep my medication? This medication is given in a hospital or clinic. It will not be stored at home. NOTE: This sheet is a summary. It may not cover all possible information. If you have questions about this medicine, talk to your doctor, pharmacist, or health care provider.  2024 Elsevier/Gold Standard (2021-05-19 00:00:00)  

## 2022-09-29 NOTE — Progress Notes (Unsigned)
Pt's platelets noted at 134K today. Spoke with S. Carter,NP and Lisa Halcomb about platelet levels. Pt is having surgery on 10/05/2022, needing platelets around 150. N-Plate to be given today.

## 2022-10-05 ENCOUNTER — Ambulatory Visit (HOSPITAL_COMMUNITY): Payer: Medicare Other | Admitting: Certified Registered Nurse Anesthetist

## 2022-10-05 ENCOUNTER — Inpatient Hospital Stay (HOSPITAL_COMMUNITY)
Admission: RE | Admit: 2022-10-05 | Discharge: 2022-10-09 | DRG: 907 | Disposition: A | Discharge status: Home Health | Payer: Medicare Other | Attending: Vascular Surgery | Admitting: Vascular Surgery

## 2022-10-05 ENCOUNTER — Other Ambulatory Visit: Payer: Self-pay

## 2022-10-05 ENCOUNTER — Ambulatory Visit (HOSPITAL_COMMUNITY): Payer: Medicare Other

## 2022-10-05 ENCOUNTER — Other Ambulatory Visit: Payer: Self-pay | Admitting: Family

## 2022-10-05 ENCOUNTER — Inpatient Hospital Stay (HOSPITAL_COMMUNITY): Payer: Medicare Other

## 2022-10-05 ENCOUNTER — Encounter (HOSPITAL_COMMUNITY)
Admission: RE | Disposition: A | Discharge status: Home Health | Payer: Self-pay | Source: Home / Self Care | Attending: Vascular Surgery

## 2022-10-05 DIAGNOSIS — E785 Hyperlipidemia, unspecified: Secondary | ICD-10-CM | POA: Diagnosis not present

## 2022-10-05 DIAGNOSIS — Z862 Personal history of diseases of the blood and blood-forming organs and certain disorders involving the immune mechanism: Secondary | ICD-10-CM

## 2022-10-05 DIAGNOSIS — T8119XA Other postprocedural shock, initial encounter: Secondary | ICD-10-CM | POA: Diagnosis not present

## 2022-10-05 DIAGNOSIS — I9581 Postprocedural hypotension: Secondary | ICD-10-CM | POA: Diagnosis not present

## 2022-10-05 DIAGNOSIS — Z87891 Personal history of nicotine dependence: Secondary | ICD-10-CM | POA: Diagnosis not present

## 2022-10-05 DIAGNOSIS — D62 Acute posthemorrhagic anemia: Secondary | ICD-10-CM | POA: Diagnosis not present

## 2022-10-05 DIAGNOSIS — G8929 Other chronic pain: Secondary | ICD-10-CM | POA: Diagnosis present

## 2022-10-05 DIAGNOSIS — I723 Aneurysm of iliac artery: Secondary | ICD-10-CM | POA: Diagnosis present

## 2022-10-05 DIAGNOSIS — Z8249 Family history of ischemic heart disease and other diseases of the circulatory system: Secondary | ICD-10-CM

## 2022-10-05 DIAGNOSIS — I472 Ventricular tachycardia, unspecified: Secondary | ICD-10-CM | POA: Diagnosis present

## 2022-10-05 DIAGNOSIS — F17210 Nicotine dependence, cigarettes, uncomplicated: Secondary | ICD-10-CM | POA: Diagnosis present

## 2022-10-05 DIAGNOSIS — I7772 Dissection of iliac artery: Secondary | ICD-10-CM | POA: Diagnosis present

## 2022-10-05 DIAGNOSIS — Z7982 Long term (current) use of aspirin: Secondary | ICD-10-CM

## 2022-10-05 DIAGNOSIS — I97638 Postprocedural hematoma of a circulatory system organ or structure following other circulatory system procedure: Secondary | ICD-10-CM | POA: Diagnosis not present

## 2022-10-05 DIAGNOSIS — R58 Hemorrhage, not elsewhere classified: Secondary | ICD-10-CM | POA: Diagnosis present

## 2022-10-05 DIAGNOSIS — N400 Enlarged prostate without lower urinary tract symptoms: Secondary | ICD-10-CM | POA: Diagnosis present

## 2022-10-05 DIAGNOSIS — D693 Immune thrombocytopenic purpura: Secondary | ICD-10-CM

## 2022-10-05 DIAGNOSIS — I77810 Thoracic aortic ectasia: Secondary | ICD-10-CM | POA: Diagnosis not present

## 2022-10-05 DIAGNOSIS — H409 Unspecified glaucoma: Secondary | ICD-10-CM | POA: Diagnosis present

## 2022-10-05 DIAGNOSIS — I2510 Atherosclerotic heart disease of native coronary artery without angina pectoris: Secondary | ICD-10-CM | POA: Diagnosis present

## 2022-10-05 DIAGNOSIS — N179 Acute kidney failure, unspecified: Secondary | ICD-10-CM | POA: Diagnosis not present

## 2022-10-05 DIAGNOSIS — S3991XD Unspecified injury of abdomen, subsequent encounter: Secondary | ICD-10-CM | POA: Diagnosis not present

## 2022-10-05 DIAGNOSIS — N1831 Chronic kidney disease, stage 3a: Secondary | ICD-10-CM | POA: Diagnosis present

## 2022-10-05 DIAGNOSIS — S3991XA Unspecified injury of abdomen, initial encounter: Secondary | ICD-10-CM | POA: Diagnosis not present

## 2022-10-05 DIAGNOSIS — I739 Peripheral vascular disease, unspecified: Secondary | ICD-10-CM | POA: Diagnosis present

## 2022-10-05 DIAGNOSIS — Z79899 Other long term (current) drug therapy: Secondary | ICD-10-CM

## 2022-10-05 DIAGNOSIS — G4733 Obstructive sleep apnea (adult) (pediatric): Secondary | ICD-10-CM | POA: Diagnosis present

## 2022-10-05 DIAGNOSIS — I97618 Postprocedural hemorrhage and hematoma of a circulatory system organ or structure following other circulatory system procedure: Secondary | ICD-10-CM

## 2022-10-05 DIAGNOSIS — Z807 Family history of other malignant neoplasms of lymphoid, hematopoietic and related tissues: Secondary | ICD-10-CM | POA: Diagnosis not present

## 2022-10-05 DIAGNOSIS — Y838 Other surgical procedures as the cause of abnormal reaction of the patient, or of later complication, without mention of misadventure at the time of the procedure: Secondary | ICD-10-CM | POA: Diagnosis not present

## 2022-10-05 DIAGNOSIS — Z4682 Encounter for fitting and adjustment of non-vascular catheter: Secondary | ICD-10-CM | POA: Diagnosis not present

## 2022-10-05 DIAGNOSIS — Z452 Encounter for adjustment and management of vascular access device: Secondary | ICD-10-CM | POA: Diagnosis not present

## 2022-10-05 DIAGNOSIS — I129 Hypertensive chronic kidney disease with stage 1 through stage 4 chronic kidney disease, or unspecified chronic kidney disease: Secondary | ICD-10-CM | POA: Diagnosis present

## 2022-10-05 DIAGNOSIS — Z8349 Family history of other endocrine, nutritional and metabolic diseases: Secondary | ICD-10-CM | POA: Diagnosis not present

## 2022-10-05 DIAGNOSIS — I10 Essential (primary) hypertension: Secondary | ICD-10-CM | POA: Diagnosis not present

## 2022-10-05 DIAGNOSIS — R001 Bradycardia, unspecified: Secondary | ICD-10-CM | POA: Diagnosis not present

## 2022-10-05 DIAGNOSIS — K219 Gastro-esophageal reflux disease without esophagitis: Secondary | ICD-10-CM | POA: Diagnosis present

## 2022-10-05 DIAGNOSIS — Z8719 Personal history of other diseases of the digestive system: Secondary | ICD-10-CM

## 2022-10-05 HISTORY — PX: ABDOMINAL AORTOGRAM: CATH118222

## 2022-10-05 LAB — BASIC METABOLIC PANEL
Anion gap: 10 (ref 5–15)
BUN: 14 mg/dL (ref 8–23)
CO2: 22 mmol/L (ref 22–32)
Calcium: 9 mg/dL (ref 8.9–10.3)
Chloride: 104 mmol/L (ref 98–111)
Creatinine, Ser: 1.13 mg/dL (ref 0.61–1.24)
GFR, Estimated: >60 mL/min (ref >60)
Glucose, Bld: 149 mg/dL — ABNORMAL HIGH (ref 70–99)
Potassium: 3.3 mmol/L — ABNORMAL LOW (ref 3.5–5.1)
Sodium: 136 mmol/L (ref 135–145)

## 2022-10-05 LAB — POCT I-STAT 7, (LYTES, BLD GAS, ICA,H+H)
Acid-Base Excess: 0 mmol/L (ref 0.0–2.0)
Acid-base deficit: 4 mmol/L — ABNORMAL HIGH (ref 0.0–2.0)
Acid-base deficit: 6 mmol/L — ABNORMAL HIGH (ref 0.0–2.0)
Acid-base deficit: 7 mmol/L — ABNORMAL HIGH (ref 0.0–2.0)
Acid-base deficit: 8 mmol/L — ABNORMAL HIGH (ref 0.0–2.0)
Bicarbonate: 20.1 mmol/L (ref 20.0–28.0)
Bicarbonate: 20.6 mmol/L (ref 20.0–28.0)
Bicarbonate: 18.9 mmol/L — ABNORMAL LOW (ref 20.0–28.0)
Bicarbonate: 18.9 mmol/L — ABNORMAL LOW (ref 20.0–28.0)
Bicarbonate: 23.2 mmol/L (ref 20.0–28.0)
Calcium, Ion: 1.09 mmol/L — ABNORMAL LOW (ref 1.15–1.40)
Calcium, Ion: 1.11 mmol/L — ABNORMAL LOW (ref 1.15–1.40)
Calcium, Ion: 0.63 mmol/L — CL (ref 1.15–1.40)
Calcium, Ion: 1.15 mmol/L (ref 1.15–1.40)
Calcium, Ion: 1.24 mmol/L (ref 1.15–1.40)
HCT: 27 % — ABNORMAL LOW (ref 39.0–52.0)
HCT: 28 % — ABNORMAL LOW (ref 39.0–52.0)
HCT: 21 % — ABNORMAL LOW (ref 39.0–52.0)
HCT: 24 % — ABNORMAL LOW (ref 39.0–52.0)
HCT: 32 % — ABNORMAL LOW (ref 39.0–52.0)
Hemoglobin: 9.2 g/dL — ABNORMAL LOW (ref 13.0–17.0)
Hemoglobin: 9.5 g/dL — ABNORMAL LOW (ref 13.0–17.0)
Hemoglobin: 7.1 g/dL — ABNORMAL LOW (ref 13.0–17.0)
Hemoglobin: 8.2 g/dL — ABNORMAL LOW (ref 13.0–17.0)
Hemoglobin: 10.9 g/dL — ABNORMAL LOW (ref 13.0–17.0)
O2 Saturation: 100 %
O2 Saturation: 100 %
O2 Saturation: 100 %
O2 Saturation: 100 %
O2 Saturation: 100 %
Patient temperature: 35
Patient temperature: 35.6
Patient temperature: 34.4
Patient temperature: 34.8
Patient temperature: 33.9
Potassium: 3.9 mmol/L (ref 3.5–5.1)
Potassium: 4 mmol/L (ref 3.5–5.1)
Potassium: 4.1 mmol/L (ref 3.5–5.1)
Potassium: 4.8 mmol/L (ref 3.5–5.1)
Potassium: 3.5 mmol/L (ref 3.5–5.1)
Sodium: 138 mmol/L (ref 135–145)
Sodium: 139 mmol/L (ref 135–145)
Sodium: 139 mmol/L (ref 135–145)
Sodium: 139 mmol/L (ref 135–145)
Sodium: 140 mmol/L (ref 135–145)
TCO2: 21 mmol/L — ABNORMAL LOW (ref 22–32)
TCO2: 22 mmol/L (ref 22–32)
TCO2: 20 mmol/L — ABNORMAL LOW (ref 22–32)
TCO2: 20 mmol/L — ABNORMAL LOW (ref 22–32)
TCO2: 24 mmol/L (ref 22–32)
pCO2 arterial: 33.4 mmHg (ref 32–48)
pCO2 arterial: 38.1 mmHg (ref 32–48)
pCO2 arterial: 37 mmHg (ref 32–48)
pCO2 arterial: 40 mmHg (ref 32–48)
pCO2 arterial: 29.1 mmHg — ABNORMAL LOW (ref 32–48)
pH, Arterial: 7.323 — ABNORMAL LOW (ref 7.35–7.45)
pH, Arterial: 7.389 (ref 7.35–7.45)
pH, Arterial: 7.268 — ABNORMAL LOW (ref 7.35–7.45)
pH, Arterial: 7.304 — ABNORMAL LOW (ref 7.35–7.45)
pH, Arterial: 7.498 — ABNORMAL HIGH (ref 7.35–7.45)
pO2, Arterial: 316 mmHg — ABNORMAL HIGH (ref 83–108)
pO2, Arterial: 511 mmHg — ABNORMAL HIGH (ref 83–108)
pO2, Arterial: 229 mmHg — ABNORMAL HIGH (ref 83–108)
pO2, Arterial: 379 mmHg — ABNORMAL HIGH (ref 83–108)
pO2, Arterial: 463 mmHg — ABNORMAL HIGH (ref 83–108)

## 2022-10-05 LAB — CBC
HCT: 29.6 % — ABNORMAL LOW (ref 39.0–52.0)
HCT: 32.6 % — ABNORMAL LOW (ref 39.0–52.0)
Hemoglobin: 9.8 g/dL — ABNORMAL LOW (ref 13.0–17.0)
Hemoglobin: 11.6 g/dL — ABNORMAL LOW (ref 13.0–17.0)
MCH: 30.3 pg (ref 26.0–34.0)
MCH: 31.2 pg (ref 26.0–34.0)
MCHC: 33.1 g/dL (ref 30.0–36.0)
MCHC: 35.6 g/dL (ref 30.0–36.0)
MCV: 91.6 fL (ref 80.0–100.0)
MCV: 87.6 fL (ref 80.0–100.0)
Platelets: 126 10*3/uL — ABNORMAL LOW (ref 150–400)
Platelets: 62 10*3/uL — ABNORMAL LOW (ref 150–400)
RBC: 3.23 MIL/uL — ABNORMAL LOW (ref 4.22–5.81)
RBC: 3.72 MIL/uL — ABNORMAL LOW (ref 4.22–5.81)
RDW: 14 % (ref 11.5–15.5)
RDW: 15.3 % (ref 11.5–15.5)
WBC: 12.8 10*3/uL — ABNORMAL HIGH (ref 4.0–10.5)
WBC: 9.1 10*3/uL (ref 4.0–10.5)
nRBC: 0 % (ref 0.0–0.2)
nRBC: 0 % (ref 0.0–0.2)

## 2022-10-05 LAB — PROTIME-INR
INR: 1.3 — ABNORMAL HIGH (ref 0.8–1.2)
Prothrombin Time: 16 s — ABNORMAL HIGH (ref 11.4–15.2)

## 2022-10-05 LAB — MAGNESIUM: Magnesium: 1.2 mg/dL — ABNORMAL LOW (ref 1.7–2.4)

## 2022-10-05 LAB — GLUCOSE, CAPILLARY
Glucose-Capillary: 103 mg/dL — ABNORMAL HIGH (ref 70–99)
Glucose-Capillary: 107 mg/dL — ABNORMAL HIGH (ref 70–99)
Glucose-Capillary: 126 mg/dL — ABNORMAL HIGH (ref 70–99)
Glucose-Capillary: 98 mg/dL (ref 70–99)

## 2022-10-05 LAB — APTT: aPTT: 34 s (ref 24–36)

## 2022-10-05 SURGERY — GROIN EXPOSURE
Anesthesia: General | Site: Groin | Laterality: Left

## 2022-10-05 SURGERY — ABDOMINAL AORTOGRAM
Anesthesia: LOCAL

## 2022-10-05 MED ORDER — HYDRALAZINE HCL 20 MG/ML IJ SOLN: 5.0000 mg | INTRAMUSCULAR | Status: DC | PRN

## 2022-10-05 MED ORDER — PANTOPRAZOLE SODIUM 40 MG PO TBEC
40.0000 mg | DELAYED_RELEASE_TABLET | Freq: Every day | ORAL | Status: DC
  Administered 2022-10-06 – 2022-10-09 (×4): 40 mg via ORAL
  Filled 2022-10-05 (×4): qty 1

## 2022-10-05 MED ORDER — FENTANYL CITRATE (PF) 250 MCG/5ML IJ SOLN
INTRAMUSCULAR | Status: AC
  Filled 2022-10-05: qty 5

## 2022-10-05 MED ORDER — CHLORHEXIDINE GLUCONATE CLOTH 2 % EX PADS
6.0000 | MEDICATED_PAD | Freq: Every day | CUTANEOUS | Status: DC
  Administered 2022-10-05 – 2022-10-09 (×6): 6 via TOPICAL

## 2022-10-05 MED ORDER — OXYCODONE HCL 5 MG PO TABS: 5.0000 mg | ORAL_TABLET | ORAL | Status: DC | PRN

## 2022-10-05 MED ORDER — SURGIFLO WITH THROMBIN (HEMOSTATIC MATRIX KIT) OPTIME
TOPICAL | Status: DC | PRN
Start: 2022-10-05 — End: 2022-10-05
  Administered 2022-10-05: 1 via TOPICAL

## 2022-10-05 MED ORDER — CLOPIDOGREL BISULFATE 75 MG PO TABS: 75.0000 mg | ORAL_TABLET | Freq: Every day | ORAL | 11 refills | Status: DC

## 2022-10-05 MED ORDER — HEPARIN (PORCINE) IN NACL 1000-0.9 UT/500ML-% IV SOLN
INTRAVENOUS | Status: DC | PRN
  Administered 2022-10-05 (×2): 500 mL

## 2022-10-05 MED ORDER — ONDANSETRON HCL 4 MG/2ML IJ SOLN: 4.0000 mg | Freq: Four times a day (QID) | INTRAMUSCULAR | Status: DC | PRN

## 2022-10-05 MED ORDER — CLOPIDOGREL BISULFATE 75 MG PO TABS: 75.0000 mg | ORAL_TABLET | Freq: Every day | ORAL | Status: DC

## 2022-10-05 MED ORDER — PHENOL 1.4 % MT LIQD: 1.0000 | OROMUCOSAL | Status: DC | PRN

## 2022-10-05 MED ORDER — MIDAZOLAM HCL 2 MG/2ML IJ SOLN
INTRAMUSCULAR | Status: AC
  Filled 2022-10-05: qty 2

## 2022-10-05 MED ORDER — SUCCINYLCHOLINE CHLORIDE 200 MG/10ML IV SOSY
PREFILLED_SYRINGE | INTRAVENOUS | Status: DC | PRN
  Administered 2022-10-05: 120 mg via INTRAVENOUS

## 2022-10-05 MED ORDER — LIDOCAINE HCL (PF) 1 % IJ SOLN
INTRAMUSCULAR | Status: AC
  Filled 2022-10-05: qty 30

## 2022-10-05 MED ORDER — CALCIUM CHLORIDE 10 % IV SOLN
INTRAVENOUS | Status: DC | PRN
Start: 2022-10-05 — End: 2022-10-05
  Administered 2022-10-05 (×3): 200 mg via INTRAVENOUS
  Administered 2022-10-05: 400 mg via INTRAVENOUS
  Administered 2022-10-05: 200 mg via INTRAVENOUS
  Administered 2022-10-05: 300 mg via INTRAVENOUS
  Administered 2022-10-05: 200 mg via INTRAVENOUS
  Administered 2022-10-05: 300 mg via INTRAVENOUS

## 2022-10-05 MED ORDER — CEFAZOLIN SODIUM-DEXTROSE 2-4 GM/100ML-% IV SOLN
2.0000 g | Freq: Three times a day (TID) | INTRAVENOUS | Status: AC
  Administered 2022-10-05 – 2022-10-06 (×2): 2 g via INTRAVENOUS
  Filled 2022-10-05 (×2): qty 100

## 2022-10-05 MED ORDER — ALUM & MAG HYDROXIDE-SIMETH 200-200-20 MG/5ML PO SUSP: 15.0000 mL | ORAL | Status: DC | PRN

## 2022-10-05 MED ORDER — POTASSIUM CHLORIDE 20 MEQ PO PACK
40.0000 meq | PACK | Freq: Once | ORAL | Status: AC
  Administered 2022-10-05: 40 meq
  Filled 2022-10-05: qty 2

## 2022-10-05 MED ORDER — ORAL CARE MOUTH RINSE
15.0000 mL | OROMUCOSAL | Status: DC
  Administered 2022-10-05 – 2022-10-06 (×12): 15 mL via OROMUCOSAL

## 2022-10-05 MED ORDER — HEMOSTATIC AGENTS (NO CHARGE) OPTIME
TOPICAL | Status: DC | PRN
Start: 2022-10-05 — End: 2022-10-05
  Administered 2022-10-05: 2 via TOPICAL

## 2022-10-05 MED ORDER — SODIUM CHLORIDE 0.9 % IV SOLN
INTRAVENOUS | Status: DC | PRN
Start: 2022-10-05 — End: 2022-10-05

## 2022-10-05 MED ORDER — ALBUMIN HUMAN 5 % IV SOLN: INTRAVENOUS | Status: DC | PRN

## 2022-10-05 MED ORDER — HEPARIN SODIUM (PORCINE) 1000 UNIT/ML IJ SOLN
INTRAMUSCULAR | Status: AC
  Filled 2022-10-05: qty 10

## 2022-10-05 MED ORDER — MORPHINE SULFATE (PF) 2 MG/ML IV SOLN: 2.0000 mg | INTRAVENOUS | Status: DC | PRN

## 2022-10-05 MED ORDER — FENTANYL CITRATE (PF) 100 MCG/2ML IJ SOLN
INTRAMUSCULAR | Status: AC
  Filled 2022-10-05: qty 2

## 2022-10-05 MED ORDER — IODIXANOL 320 MG/ML IV SOLN
INTRAVENOUS | Status: DC | PRN
  Administered 2022-10-05: 60 mL

## 2022-10-05 MED ORDER — CLOPIDOGREL BISULFATE 300 MG PO TABS
ORAL_TABLET | ORAL | Status: DC | PRN
  Administered 2022-10-05: 300 mg via ORAL

## 2022-10-05 MED ORDER — MIDAZOLAM HCL 2 MG/2ML IJ SOLN
INTRAMUSCULAR | Status: DC | PRN
  Administered 2022-10-05: 1 mg via INTRAVENOUS

## 2022-10-05 MED ORDER — SODIUM CHLORIDE 0.9 % IV SOLN: INTRAVENOUS | Status: DC

## 2022-10-05 MED ORDER — ROCURONIUM BROMIDE 10 MG/ML (PF) SYRINGE
PREFILLED_SYRINGE | INTRAVENOUS | Status: DC | PRN
  Administered 2022-10-05 (×2): 50 mg via INTRAVENOUS

## 2022-10-05 MED ORDER — LOSARTAN POTASSIUM-HCTZ 100-12.5 MG PO TABS: 1.0000 | ORAL_TABLET | Freq: Every day | ORAL | Status: DC

## 2022-10-05 MED ORDER — GUAIFENESIN-DM 100-10 MG/5ML PO SYRP: 15.0000 mL | ORAL_SOLUTION | ORAL | Status: DC | PRN

## 2022-10-05 MED ORDER — SODIUM CHLORIDE 0.9 % IV SOLN: 250.0000 mL | INTRAVENOUS | Status: DC | PRN

## 2022-10-05 MED ORDER — HEPARIN SODIUM (PORCINE) 1000 UNIT/ML IJ SOLN
INTRAMUSCULAR | Status: DC | PRN
  Administered 2022-10-05: 5000 [IU] via INTRAVENOUS

## 2022-10-05 MED ORDER — MAGNESIUM SULFATE 2 GM/50ML IV SOLN: 2.0000 g | Freq: Once | INTRAVENOUS | Status: AC

## 2022-10-05 MED ORDER — ACETAMINOPHEN 650 MG RE SUPP: 325.0000 mg | RECTAL | Status: DC | PRN

## 2022-10-05 MED ORDER — CLOPIDOGREL BISULFATE 75 MG PO TABS: 300.0000 mg | ORAL_TABLET | Freq: Once | ORAL | Status: DC

## 2022-10-05 MED ORDER — ACETAMINOPHEN 325 MG PO TABS: 650.0000 mg | ORAL_TABLET | ORAL | Status: DC | PRN

## 2022-10-05 MED ORDER — MORPHINE SULFATE (PF) 2 MG/ML IV SOLN
2.0000 mg | INTRAVENOUS | Status: DC | PRN
  Administered 2022-10-05 – 2022-10-06 (×5): 2 mg via INTRAVENOUS
  Filled 2022-10-05 (×5): qty 1

## 2022-10-05 MED ORDER — LACTATED RINGERS IV SOLN: INTRAVENOUS | Status: DC | PRN

## 2022-10-05 MED ORDER — ROSUVASTATIN CALCIUM 20 MG PO TABS
20.0000 mg | ORAL_TABLET | Freq: Every day | ORAL | Status: DC
  Administered 2022-10-05 – 2022-10-09 (×5): 20 mg via ORAL
  Filled 2022-10-05 (×5): qty 1

## 2022-10-05 MED ORDER — LABETALOL HCL 5 MG/ML IV SOLN: 10.0000 mg | INTRAVENOUS | Status: DC | PRN

## 2022-10-05 MED ORDER — VASOPRESSIN 20 UNIT/ML IV SOLN
INTRAVENOUS | Status: DC | PRN
Start: 2022-10-05 — End: 2022-10-05
  Administered 2022-10-05 (×2): 2 [IU] via INTRAVENOUS
  Administered 2022-10-05 (×3): 1 [IU] via INTRAVENOUS
  Administered 2022-10-05: 2 [IU] via INTRAVENOUS

## 2022-10-05 MED ORDER — NOREPINEPHRINE 4 MG/250ML-% IV SOLN
INTRAVENOUS | Status: DC | PRN
Start: 2022-10-05 — End: 2022-10-05
  Administered 2022-10-05: 4 ug/min via INTRAVENOUS

## 2022-10-05 MED ORDER — PHENYLEPHRINE HCL-NACL 20-0.9 MG/250ML-% IV SOLN
INTRAVENOUS | Status: DC | PRN
  Administered 2022-10-05: 30 ug/min via INTRAVENOUS

## 2022-10-05 MED ORDER — MAGNESIUM SULFATE 2 GM/50ML IV SOLN
2.0000 g | Freq: Every day | INTRAVENOUS | Status: AC | PRN
  Administered 2022-10-05: 2 g via INTRAVENOUS
  Filled 2022-10-05: qty 50

## 2022-10-05 MED ORDER — HYDROCHLOROTHIAZIDE 12.5 MG PO TABS: 12.5000 mg | ORAL_TABLET | Freq: Every day | ORAL | Status: DC

## 2022-10-05 MED ORDER — ETOMIDATE 2 MG/ML IV SOLN
INTRAVENOUS | Status: DC | PRN
Start: 2022-10-05 — End: 2022-10-05
  Administered 2022-10-05: 12 mg via INTRAVENOUS

## 2022-10-05 MED ORDER — PROPOFOL 500 MG/50ML IV EMUL
INTRAVENOUS | Status: DC | PRN
Start: 2022-10-05 — End: 2022-10-05
  Administered 2022-10-05: 50 ug/kg/min via INTRAVENOUS

## 2022-10-05 MED ORDER — LOSARTAN POTASSIUM 50 MG PO TABS: 100.0000 mg | ORAL_TABLET | Freq: Every day | ORAL | Status: DC

## 2022-10-05 MED ORDER — ATENOLOL 25 MG PO TABS
12.5000 mg | ORAL_TABLET | Freq: Two times a day (BID) | ORAL | Status: DC
  Filled 2022-10-05: qty 0.5

## 2022-10-05 MED ORDER — FENTANYL CITRATE (PF) 250 MCG/5ML IJ SOLN
INTRAMUSCULAR | Status: DC | PRN
Start: 2022-10-05 — End: 2022-10-05
  Administered 2022-10-05: 50 ug via INTRAVENOUS
  Administered 2022-10-05: 150 ug via INTRAVENOUS
  Administered 2022-10-05: 50 ug via INTRAVENOUS

## 2022-10-05 MED ORDER — SODIUM CHLORIDE 0.9 % IV SOLN: 500.0000 mL | Freq: Once | INTRAVENOUS | Status: DC | PRN

## 2022-10-05 MED ORDER — SODIUM CHLORIDE 0.9% FLUSH: 3.0000 mL | Freq: Two times a day (BID) | INTRAVENOUS | Status: DC

## 2022-10-05 MED ORDER — CLOPIDOGREL BISULFATE 300 MG PO TABS
ORAL_TABLET | ORAL | Status: AC
  Filled 2022-10-05: qty 1

## 2022-10-05 MED ORDER — LIDOCAINE HCL (PF) 1 % IJ SOLN
INTRAMUSCULAR | Status: DC | PRN
  Administered 2022-10-05: 15 mL

## 2022-10-05 MED ORDER — AMLODIPINE BESYLATE 10 MG PO TABS: 10.0000 mg | ORAL_TABLET | Freq: Every day | ORAL | Status: DC

## 2022-10-05 MED ORDER — METOPROLOL TARTRATE 5 MG/5ML IV SOLN: 2.0000 mg | INTRAVENOUS | Status: DC | PRN

## 2022-10-05 MED ORDER — SODIUM CHLORIDE 0.9 % WEIGHT BASED INFUSION: 1.0000 mL/kg/h | INTRAVENOUS | Status: DC

## 2022-10-05 MED ORDER — DOCUSATE SODIUM 100 MG PO CAPS
100.0000 mg | ORAL_CAPSULE | Freq: Every day | ORAL | Status: DC
  Administered 2022-10-06 – 2022-10-09 (×3): 100 mg via ORAL
  Filled 2022-10-05 (×3): qty 1

## 2022-10-05 MED ORDER — PROPOFOL 1000 MG/100ML IV EMUL
5.0000 ug/kg/min | INTRAVENOUS | Status: DC
  Administered 2022-10-05 (×2): 40 ug/kg/min via INTRAVENOUS
  Administered 2022-10-06: 20 ug/kg/min via INTRAVENOUS
  Administered 2022-10-06: 60 ug/kg/min via INTRAVENOUS
  Filled 2022-10-05 (×4): qty 100

## 2022-10-05 MED ORDER — SODIUM BICARBONATE 8.4 % IV SOLN
INTRAVENOUS | Status: DC | PRN
Start: 2022-10-05 — End: 2022-10-05
  Administered 2022-10-05: 50 meq via INTRAVENOUS

## 2022-10-05 MED ORDER — CEFAZOLIN SODIUM-DEXTROSE 2-3 GM-%(50ML) IV SOLR
INTRAVENOUS | Status: DC | PRN
  Administered 2022-10-05: 2 g via INTRAVENOUS

## 2022-10-05 MED ORDER — POTASSIUM CHLORIDE CRYS ER 20 MEQ PO TBCR
20.0000 meq | EXTENDED_RELEASE_TABLET | Freq: Every day | ORAL | Status: DC | PRN
  Filled 2022-10-05: qty 1

## 2022-10-05 MED ORDER — IODIXANOL 320 MG/ML IV SOLN
INTRAVENOUS | Status: DC | PRN
  Administered 2022-10-05: 80 mL via INTRA_ARTERIAL

## 2022-10-05 MED ORDER — ACETAMINOPHEN 325 MG PO TABS
325.0000 mg | ORAL_TABLET | ORAL | Status: DC | PRN
  Administered 2022-10-06 – 2022-10-08 (×3): 650 mg via ORAL
  Filled 2022-10-05 (×3): qty 2

## 2022-10-05 MED ORDER — NOREPINEPHRINE 4 MG/250ML-% IV SOLN: 0.0000 ug/min | INTRAVENOUS | Status: DC

## 2022-10-05 MED ORDER — SODIUM CHLORIDE 0.9% FLUSH: 3.0000 mL | INTRAVENOUS | Status: DC | PRN

## 2022-10-05 MED ORDER — FENTANYL CITRATE (PF) 100 MCG/2ML IJ SOLN
INTRAMUSCULAR | Status: DC | PRN
  Administered 2022-10-05: 25 ug via INTRAVENOUS

## 2022-10-05 MED ORDER — ORAL CARE MOUTH RINSE: 15.0000 mL | OROMUCOSAL | Status: DC | PRN

## 2022-10-05 MED ORDER — EPHEDRINE SULFATE-NACL 50-0.9 MG/10ML-% IV SOSY
PREFILLED_SYRINGE | INTRAVENOUS | Status: DC | PRN
  Administered 2022-10-05 (×2): 10 mg via INTRAVENOUS

## 2022-10-05 SURGICAL SUPPLY — 23 items
BALLN MUSTANG 10X20X75 (BALLOONS) ×1
BALLOON MUSTANG 10X20X75 (BALLOONS) IMPLANT
CATH OMNI FLUSH 5F 65CM (CATHETERS) IMPLANT
CATH QUICKCROSS SUPP .035X90CM (MICROCATHETER) IMPLANT
CLOSURE MYNX CONTROL 6F/7F (Vascular Products) IMPLANT
DEVICE TORQUE .025-.038 (MISCELLANEOUS) IMPLANT
GUIDEWIRE ANGLED .035X150CM (WIRE) IMPLANT
KIT ENCORE 26 ADVANTAGE (KITS) IMPLANT
KIT MICROPUNCTURE NIT STIFF (SHEATH) IMPLANT
SET ATX-X65L (MISCELLANEOUS) IMPLANT
SHEATH CATAPULT 7F 45 MP (SHEATH) IMPLANT
SHEATH PINNACLE 5F 10CM (SHEATH) IMPLANT
SHEATH PINNACLE 7F 10CM (SHEATH) IMPLANT
SHEATH PROBE COVER 6X72 (BAG) IMPLANT
STENT VIABAHN 39XCATH 80 (Permanent Stent) IMPLANT
STENT VIABAHN 7X59 6FR 80 (Permanent Stent) IMPLANT
STENT VIABAHN 7X7.5X120 (Permanent Stent) IMPLANT
STENT VIABAHN 8X39 7FR 80 (Permanent Stent) ×1 IMPLANT
STENT VIABAHN 9X29 7FR 80 (Permanent Stent) IMPLANT
TRAY PV CATH (CUSTOM PROCEDURE TRAY) ×1 IMPLANT
WIRE BENTSON .035X145CM (WIRE) IMPLANT
WIRE G V18X300CM (WIRE) IMPLANT
WIRE ROSEN-J .035X180CM (WIRE) IMPLANT

## 2022-10-05 SURGICAL SUPPLY — 53 items
ADH SKN CLS APL DERMABOND .7 (GAUZE/BANDAGES/DRESSINGS) ×1
AGENT HMST MTR 8 SURGIFLO (HEMOSTASIS) ×1
CANISTER SUCT 3000ML PPV (MISCELLANEOUS) ×1 IMPLANT
CATH CROSS OVER TEMPO 5F (CATHETERS) IMPLANT
CATH QUICKCROSS .035X135CM (MICROCATHETER) IMPLANT
CATH QUICKCROSS SUPP .035X90CM (MICROCATHETER) IMPLANT
CLIP LIGATING EXTRA MED SLVR (CLIP) ×1 IMPLANT
CLIP LIGATING EXTRA SM BLUE (MISCELLANEOUS) ×1 IMPLANT
DERMABOND ADVANCED .7 DNX12 (GAUZE/BANDAGES/DRESSINGS) ×1 IMPLANT
DEVICE TORQUE KENDALL .025-038 (MISCELLANEOUS) IMPLANT
DRAPE C-ARM 42X72 X-RAY (DRAPES) IMPLANT
DRAPE XRAY CASSETTE 23X24 (DRAPES) IMPLANT
DRSG COVADERM 4X10 (GAUZE/BANDAGES/DRESSINGS) IMPLANT
DRSG COVADERM 4X6 (GAUZE/BANDAGES/DRESSINGS) IMPLANT
ELECT BLADE 4.0 EZ CLEAN MEGAD (MISCELLANEOUS) ×1
ELECTRODE BLDE 4.0 EZ CLN MEGD (MISCELLANEOUS) IMPLANT
GLOVE BIO SURGEON STRL SZ7.5 (GLOVE) ×1 IMPLANT
GOWN STRL REUS W/ TWL LRG LVL3 (GOWN DISPOSABLE) ×3 IMPLANT
GOWN STRL REUS W/ TWL XL LVL3 (GOWN DISPOSABLE) ×2 IMPLANT
GOWN STRL REUS W/TWL LRG LVL3 (GOWN DISPOSABLE) ×3
GOWN STRL REUS W/TWL XL LVL3 (GOWN DISPOSABLE) ×2
GUIDEWIRE ANGLED .035X150CM (WIRE) IMPLANT
KIT BASIN OR (CUSTOM PROCEDURE TRAY) ×1 IMPLANT
KIT TURNOVER KIT B (KITS) ×1 IMPLANT
NS IRRIG 1000ML POUR BTL (IV SOLUTION) ×1 IMPLANT
PACK PERIPHERAL VASCULAR (CUSTOM PROCEDURE TRAY) ×1 IMPLANT
PAD ARMBOARD 7.5X6 YLW CONV (MISCELLANEOUS) ×1 IMPLANT
POWDER SURGICEL 3.0 GRAM (HEMOSTASIS) IMPLANT
SET MICROPUNCTURE 5F STIFF (MISCELLANEOUS) IMPLANT
SHEATH APTUS 7.0FR 180D 55 (SHEATH) IMPLANT
SHEATH CATAPULT 7FR 45 (SHEATH) IMPLANT
SHEATH INTROD PASSPORT 45 6.5F (SHEATH) IMPLANT
SHEATH PINNACLE 5F 10CM (SHEATH) IMPLANT
SPONGE INTESTINAL PEANUT (DISPOSABLE) IMPLANT
SPONGE SURGIFLO 8M (HEMOSTASIS) IMPLANT
SPONGE T-LAP 18X18 ~~LOC~~+RFID (SPONGE) IMPLANT
STAPLER VISISTAT 35W (STAPLE) IMPLANT
STENT VIABAHN 7X59 6FR 80 (Permanent Stent) IMPLANT
SUT MNCRL AB 4-0 PS2 18 (SUTURE) ×1 IMPLANT
SUT PDS AB 1 TP1 54 (SUTURE) IMPLANT
SUT PROLENE 5 0 C 1 24 (SUTURE) ×1 IMPLANT
SUT PROLENE 6 0 BV (SUTURE) ×1 IMPLANT
SUT VIC AB 2-0 CT1 27 (SUTURE) ×2
SUT VIC AB 2-0 CT1 TAPERPNT 27 (SUTURE) ×1 IMPLANT
SUT VIC AB 3-0 SH 27 (SUTURE) ×1
SUT VIC AB 3-0 SH 27X BRD (SUTURE) ×1 IMPLANT
SYR 20ML LL LF (SYRINGE) IMPLANT
TOWEL GREEN STERILE (TOWEL DISPOSABLE) ×1 IMPLANT
WATER STERILE IRR 1000ML POUR (IV SOLUTION) ×1 IMPLANT
WIRE BENTSON .035X145CM (WIRE) IMPLANT
WIRE G V18X300 ST (WIRE) IMPLANT
WIRE ROSEN 145CM (WIRE) IMPLANT
WIRE ROSEN-J .035X260CM (WIRE) IMPLANT

## 2022-10-05 NOTE — Progress Notes (Addendum)
    Patient was initially noted to have a small hematoma in the left groin.  He has now had 2 episodes of bradycardia and hypotension and had a small episode of nonsustained V. tach.  He does have pain in the left lower quadrant and left groin.  He may have a retroperitoneal bleed from the left common femoral artery access site but he is not stable for CT scan at this time.  Alternatively he may have a bleed from a small wire perforation.  Will plan to explore the left groin to washout the hematoma and evaluate for any evidence of bleeding and possibly perform angiography and need to coil if there is perforation.  This was discussed with the sister she demonstrates good understanding is communicated to the rest of the family.  We are proceeding emergently to the OR.  Shaylah Mcghie C. Randie Heinz, MD [facility] [place] Office: [phone] Pager: [phone]

## 2022-10-05 NOTE — Anesthesia Preprocedure Evaluation (Addendum)
Anesthesia Evaluation  Patient identified by MRN, date of birth, ID band Patient awake    Reviewed: Allergy & Precautions, H&P , NPO status , Patient's Chart, lab work & pertinent test results  Airway Mallampati: II  TM Distance: >3 FB Neck ROM: Full    Dental  (+) Dental Advisory Given   Pulmonary sleep apnea , former smoker   Pulmonary exam normal breath sounds clear to auscultation       Cardiovascular hypertension, + CAD and + Peripheral Vascular Disease  Normal cardiovascular exam Rhythm:Regular Rate:Normal  Echo 09/16/20 1. Left ventricular ejection fraction, by estimation, is 65 to 70%. The left ventricle has normal function. The left ventricle has no regional wall motion abnormalities. There is mild left ventricular hypertrophy. Left ventricular diastolic parameters  were normal.   2. Right ventricular systolic function is normal. The right ventricular size is normal.   3. Mild mitral valve regurgitation.   4. 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 No significant change from report of April 2021. . The aortic valve has been repaired/replaced. Aortic valve  regurgitation is not visualized. )      Neuro/Psych  Headaches  negative psych ROS   GI/Hepatic Neg liver ROS,GERD  ,,  Endo/Other  negative endocrine ROS    Renal/GU Renal InsufficiencyRenal disease     Musculoskeletal  (+) Arthritis ,    Abdominal   Peds negative pediatric ROS (+)  Hematology  (+) Blood dyscrasia, anemia   Anesthesia Other Findings   Reproductive/Obstetrics negative OB ROS                             Anesthesia Physical Anesthesia Plan  ASA: 3 and emergent  Anesthesia Plan: General   Post-op Pain Management: Minimal or no pain anticipated   Induction: Intravenous  PONV Risk Score and Plan: 2 and Ondansetron,  Dexamethasone and Treatment may vary due to age or medical condition  Airway Management Planned: Oral ETT  Additional Equipment: Arterial line  Intra-op Plan:   Post-operative Plan: Extubation in OR  Informed Consent: I have reviewed the patients History and Physical, chart, labs and discussed the procedure including the risks, benefits and alternatives for the proposed anesthesia with the patient or authorized representative who has indicated his/her understanding and acceptance.     Dental advisory given  Plan Discussed with: CRNA  Anesthesia Plan Comments: (+/- cvl Pt with antibodies per blood bank. Dr. Randie Heinz states patient is emergent and must go to OR AT note written 11/19/2021 by Shonna Chock, PA-C. )        Anesthesia Quick Evaluation

## 2022-10-05 NOTE — Op Note (Signed)
    Patient name: Lonnie Hansen MRN: [id number] DOB: 08/17/1950 Sex: male  10/05/2022 Pre-operative Diagnosis: Left hypogastric artery aneurysm Post-operative diagnosis:  Same Surgeon:  Apolinar Junes C. Randie Heinz, MD Procedure Performed: 1.  Percutaneous ultrasound-guided access and Mynx device closure left common femoral artery 2.  Stent of the left hypogastric artery with distal 7 x 75 mm Viabahn extended proximally with 8 x 39 mm VBX 3.  Stent of left external iliac artery with 9 x 29 mm VBX 4.  Moderate sedation with fentanyl and Versed for 46 minutes  Indications: 72 year old male with history of abdominal aortic aneurysm status post repair with coil embolization of the right hypogastric artery.  He has a stable enlarged left hypogastric artery aneurysm which is now indicated for repair.  Findings: Hypogastric artery aneurysm was identified and we were able to stent just beyond this and extend back with VBX and there was complete seal and no filling of the aneurysm at completion.  Unfortunately due to the angulation there was a dissection in the external neck artery and this was stented to no dissection and 0% stenosis.  Plan will be to follow-up in 4 to 6 weeks with CT angio to evaluate left hypogastric artery aneurysm   Procedure:  The patient was identified in the holding area and taken to room 8.  The patient was then placed supine on the table and prepped and draped in the usual sterile fashion.  A time out was called.  Ultrasound was used to evaluate the left common femoral artery which was noted to be patent.  The area was anesthetized 1% lidocaine and cannulated with a micropuncture needle followed by wire and a sheath.  Concomitantly we administered fentanyl and Versed as moderate sedation his vital signs were monitored by bedside nursing throughout the case.  We placed a Bentson wire followed by a 5 French sheath and then an Omni catheter into the left common iliac artery limb and performed  angiography from that level.  We were able to cannulate the hypogastric artery using the Omni catheter and Glidewire and placed this far out initially there was a small perforation we were able to backed this back and select a separate branch and placed a quick cross catheter and confirmed intraluminal access.  A Rosen wire was placed.  A long 8 French sheath was placed over the [facility] wire measuring 45 cm in length.  Patient was administered 5000 units of heparin.  We then exchanged for a 018 wire and then stented distally with 7 x 75 mm Viabahn and then extended proximally with an 8 x 39 mm VBX postdilated proximally with a 10 x 2 mm balloon.  We also used the 8 mm balloon to dilate the distal Viabahn.  Completion demonstrated no residual flow into the aneurysm sac and there was adequate seal proximally and distally.  Unfortunately there was a dissection distally in the extrailiac artery where the sheath had manipulated into the side of the wall.  This was primarily stented with a 9 x 29 mm VBX.  Completion demonstrated no residual dissection with brisk flow.  Satisfied with this we exchanged for a short 7 [place] sheath and deployed a minx device.  He tolerated the procedure without any complication.   Contrast: 60cc  Jontay Maston C. Randie Heinz, MD [facility] [place] Office: [phone] Pager: [phone]

## 2022-10-05 NOTE — Op Note (Signed)
Patient name: Lonnie Hansen MRN: [id number] DOB: 1950/12/23 Sex: male  10/05/2022 Pre-operative Diagnosis: Acute blood loss anemia Post-operative diagnosis:  Same Surgeon:  Apolinar Junes C. Randie Heinz, MD Assistants: Clotilde Dieter, MD; Nathanial Rancher, [place]; Kayren Eaves, [place] Procedure Performed: 1.  Exploration left common femoral artery with primary repair of arteriotomy 2.  Selection of left common iliac artery and angiogram 3.  Stent of the left hypogastric artery with 7 x 59 mm VBX 4.  Open exploration of left common and external iliac arteries via retroperitoneal incision  Indications: 72 year old male earlier today underwent stenting of his left hypogastric artery and left external iliac artery for 4.8 cm hypogastric artery aneurysm.  Initially postoperatively he did very well and then was noted to be hypotensive with severe abdominal pain and expanding hematoma that appeared to be preperitoneal on his abdomen.  We discussed that he was either bleeding from wire perforation or rupture of the aneurysm or possibly from the common femoral exposure site.  He was indicated for emergent takeback to the operating room for exposure of the left common femoral artery and angiography to evaluate a source of bleeding.  Findings: Left common femoral artery was hemostatic there was no retroperitoneal bleeding from the site and the arteriotomy was repaired with a 5-0 Prolene suture initially.  We performed angiography of the left common extrailiac arteries which demonstrated no evidence of extravasation.  I selected the hypogastric artery on the left and performed angiography and there was no evidence of extravasation even where there had previously been a wire perforation this was no longer bleeding.  In selecting this it appeared that the Viabahn VBX that were previously placed did become dislodged and not aneurysm was filling although did not appear to be ruptured and this was restented with an 8 x 59 mm VBX.  Given  that I did not identify any evidence of rupture or perforation on angiography then performed retroperitoneal exposure via standard ALIF incision.  We did identify a bleeding area on the pubic tubercle that was controlled with cautery and an area of very small blood vessel bleeding in the retroperitoneum that was controlled with clips.  Ultimately we did not have any excessive bleeding in the retroperitoneum was dry and we were able to close both incisions the patient appeared to be resuscitated by anesthesia.   Procedure:  The patient was identified in the holding area and taken to the operating room emergently where he was placed supine operative table sterilely prepped and draped in the bilateral groins and abdomen in the usual fashion, antibiotics were administered A-line and central lines were placed by anesthesia and massive transfusion protocol was initiated.  Initially made a vertical incision in the groin dissected down to the left common femoral artery and identified our cannulation site.  The closure device was identified and removed and we suture-ligated the common femoral artery arteriotomy with 5-0 Prolene suture.  I had then explored the retroperitoneum I was able to evacuate some hematoma there was red blood but I cannot identify any source of bleeding.  I packed the retroperitoneum from the groin incision.  I then cannulated the common femoral artery with micropuncture needle followed by wire and a sheath placed a Bentson wire followed by a 5 French sheath.  I used a crossover catheter after attempting to use a steerable sheath.  A crossover catheter was able to select the hypogastric artery stent and then I placed the Glidewire far out and a quick cross catheter and  performed angiography.  I did not identify any areas of active extravasation.  I removed the packing from the retroperitoneum performed further angiography in different views and still no active extravasation was identified and I  replaced the packing from the groin.  Completion angiography from the hypogastric artery appeared to demonstrate filling of the aneurysm sac which previously had appeared thrombosed.  Because of this I placed a Rosen wire and a long 7 French sheath and then stented the overlap between the VBX and the Viabahn with a new VBX.  Completion demonstrated no filling of the aneurysm although it did not appear that the aneurysm was ruptured so I was not convinced this was the source of bleeding.  I then removed the sheath clamped the artery proximally distally.  I opened a transversely to ensure there was no clot there was very strong antegrade and retrograde bleeding I flushed with heparinized saline and closed the arteriotomy with 5-0 Prolene suture in a running fashion.  There was a strong signal in both the anterior tibial and posterior tibial at the ankle on the left.  I then continued to explore the groin and the retroperitoneum from the groin incision.  I elected that since there was still red blood in the groin incision to make a retroperitoneal incision.  Dr. Chestine Spore scrubbed in and assisted with this.  A vertical incision was made overlying the rectus muscle from the pubic symphysis North encompassing approximately 15 cm.  We dissected down to the anterior rectus sheath and elevated some of the soft tissue off of this.  We then opened the anterior rectus sheath and bluntly retracted the rectus muscle medially.  The peritoneum was then bluntly dissected anteriorly there was significant hematoma in the retroperitoneum.  We opened some of the posterior rectus sheath ensuring no injury to the retroperitoneum.  We then easily identified the external and common iliac arteries as well as the aneurysm which did not appear to have any sites of rupture.  We thoroughly explored the retroperitoneum identified a bleeding area on the pubic tubercle and this was controlled with cautery.  There was a small artery in the  retroperitoneum controlled with clips.  Ultimately we placed hemostatic agent and held pressure.  We were convinced that there was no further arterial bleeding the wounds were irrigated.  We closed the anterior rectus sheath with PDS suture and then the subcutaneous tissue was closed with Vicryl and staples were placed at the skin.  In the groin we thoroughly irrigated there was no further bleeding we closed with Vicryl and staples again at the skin.  Given the patient's massive resuscitation we elected for him to remain intubated and transferred to the ICU in critical condition although he had significantly stabilized from preoperatively.  All counts were correct at completion.    EBL: 2 liters  Transfusion: 10 prbc's, 5 ffp, 1 cryo, 1 platelets   Deshondra Worst C. Randie Heinz, MD [facility] [place] Office: [phone] Pager: [phone]

## 2022-10-05 NOTE — Transfer of Care (Addendum)
Immediate Anesthesia Transfer of Care Note  Patient: Lonnie Hansen  Procedure(s) Performed: Drucie Ip EXPOSURE (Left)  Patient Location: ICU  Anesthesia Type:General  Level of Consciousness: Patient remains intubated per anesthesia plan  Airway & Oxygen Therapy: Patient remains intubated per anesthesia plan and Patient placed on Ventilator (see vital sign flow sheet for setting)  Post-op Assessment: Report given to RN and Post -op Vital signs reviewed and stable  Post vital signs: Reviewed and stable  Last Vitals:  Vitals Value Taken Time  BP    Temp    Pulse    Resp    SpO2      Last Pain:  Vitals:   10/05/22 1041  TempSrc:   PainSc: 0-No pain         Complications: No notable events documented.

## 2022-10-05 NOTE — Anesthesia Procedure Notes (Signed)
Procedure Name: Intubation Date/Time: 10/05/2022 12:42 PM  Performed by: Sheppard Evens, CRNAPre-anesthesia Checklist: Patient identified, Emergency Drugs available, Suction available and Patient being monitored Patient Re-evaluated:Patient Re-evaluated prior to induction Oxygen Delivery Method: Circle System Utilized Preoxygenation: Pre-oxygenation with 100% oxygen Induction Type: IV induction Ventilation: Mask ventilation without difficulty Laryngoscope Size: Mac and 4 Grade View: Grade I Tube type: Oral Tube size: 7.5 mm Number of attempts: 1 Airway Equipment and Method: Stylet and Oral airway Placement Confirmation: ETT inserted through vocal cords under direct vision, positive ETCO2 and breath sounds checked- equal and bilateral Secured at: 22 cm Tube secured with: Tape Dental Injury: Teeth and Oropharynx as per pre-operative assessment

## 2022-10-05 NOTE — Progress Notes (Signed)
This RN was called to the patients room at approximately 1136 by the Nurse Tech following restroom call. Patient was unresponsive, diaphoretic and bradycardic upon assessment. The patient was on continuous monitor with BP's trending down. Rapid Response was paged at 1139 and the Charge RN was called to patients room. After rapid arrived, patient was starting to become more alert slowly and able to respond to verbal stimuli but remained diaphoretic . Patients CBG was 103, bp was 51/31, Heart Rate 42 with RR of 25 and fluids were given at rate of 999 for bolus to help increase bp . Groin site was still soft to touch but pt c/o 10/10 pain in the Lower bilateral  abdomen that increased with palpation. Provider Cain,MD was paged to update on pts current status and while provider was in the room the patient had a run of unsustained V-Tach at 1207 with HR of 52. Cain,MD made the decision to take the patient back to the OR. The patient arrived to the Procedural Short Stay  A/O x4 at 1100 from Cath Lab post Abdominal Aortogram with a Left groin site. Patient had hematoma prior to arrival in Procedural Short Stay that was resolved. Patient pulses patent per doppler and groin site was soft to touch. Patient was taken back to OR and family was updated.

## 2022-10-05 NOTE — Anesthesia Procedure Notes (Signed)
Arterial Line Insertion Start/End9/09/2022 12:33 PM, 10/05/2022 12:38 PM Performed by: Lewie Loron, MD, anesthesiologist  Patient location: Pre-op. Preanesthetic checklist: patient identified, IV checked, site marked, risks and benefits discussed, surgical consent, monitors and equipment checked, pre-op evaluation, timeout performed and anesthesia consent Lidocaine 1% used for infiltration Left, radial was placed Catheter size: 20 G Hand hygiene performed , maximum sterile barriers used  and Seldinger technique used  Attempts: 1 Procedure performed using ultrasound guided technique. Ultrasound Notes:anatomy identified, needle tip was noted to be adjacent to the nerve/plexus identified, no ultrasound evidence of intravascular and/or intraneural injection and image(s) printed for medical record Following insertion, dressing applied and Biopatch. Post procedure assessment: normal and unchanged  Patient tolerated the procedure well with no immediate complications.

## 2022-10-05 NOTE — Anesthesia Procedure Notes (Addendum)
Central Venous Catheter Insertion Performed by: Lewie Loron, MD, anesthesiologist Start/End9/09/2022 12:43 PM, 10/05/2022 12:56 PM Patient location: Pre-op. Preanesthetic checklist: patient identified, IV checked, site marked, risks and benefits discussed, surgical consent, monitors and equipment checked, pre-op evaluation, timeout performed and anesthesia consent Position: supine Lidocaine 1% used for infiltration and patient sedated Hand hygiene performed , maximum sterile barriers used  and Seldinger technique used Catheter size: 8.5 Fr Sheath introducer Procedure performed using ultrasound guided technique. Ultrasound Notes:anatomy identified, needle tip was noted to be adjacent to the nerve/plexus identified, no ultrasound evidence of intravascular and/or intraneural injection and image(s) printed for medical record Attempts: 1 Following insertion, line sutured, dressing applied and Biopatch. Post procedure assessment: blood return through all ports, free fluid flow and no air  Patient tolerated the procedure well with no immediate complications. Additional procedure comments: Tripple lumen catheter placed through introducer port.

## 2022-10-05 NOTE — Progress Notes (Signed)
NAME:  Lonnie Hansen, MRN:  [id number], DOB:  07/08/1950, LOS: 0 ADMISSION DATE:  10/05/2022, CONSULTATION DATE: 10/05/2022 REFERRING MD: Florene Route surgery, CHIEF COMPLAINT: Hemorrhagic shock  History of Present Illness:  72 year old man who underwent stenting of a left hypogastric artery aneurysm. Postprocedure he developed a small groin hematoma and had 2 episodes of bradycardia with hypotension down to the 50s with diaphoresis and altered sensorium.  But a unsustained run of ventricular tachycardia.Marland Kitchen  He complained of pain in the left groin.  He has a history of peripheral vascular disease with prior AAA repair.  Has chronic ITP  On reexploration no bleeding found at the puncture site.  There may have been a small self-contained puncture at the stenting site which reseal itself.  A bridging stent was placed and the operative site was washed out.  The abdomen was primarily closed.  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 states  it's a" growth on vocal cord"     Significant Hospital Events: Including procedures, antibiotic start and stop dates in addition to other pertinent events   9/9-stent of the left hypogastric artery with distal 7 x 75 mm Viabahn extended proximally  with 8 x 39 mm VBX.Stent of left external iliac artery with 9 x 29 mm VBX  9/9 open reexploration for bleeding.  No clear source found.  Interim History / Subjective:  Returns to ICU sedated.  Temperature 33 8.  Off pressors.  Objective   Blood pressure (!) 79/53, pulse (!) 51, temperature (!) 97.2 F (36.2 C), temperature source Temporal, resp. rate 13, height 5\' 8"  (1.727 m), weight 82.6 kg, SpO2 100%.    Vent Mode: PRVC FiO2 (%):  [100 %] 100 % Set Rate:  [18 bmp] 18 bmp Vt Set:  [550 mL] 550 mL PEEP:  [5 cmH20] 5 cmH20 Plateau Pressure:  [15 cmH20] 15 cmH20   Intake/Output Summary (Last 24 hours) at 10/05/2022 1628 Last data filed at 10/05/2022 1558 Gross per 24 hour  Intake 29528 ml  Output 2300 ml  Net 7866 ml   Filed Weights   10/05/22 0801  Weight: 82.6 kg    Examination: General: Sedated.  Average build. HENT: Orally intubated. Lungs: Clear to auscultation bilaterally Cardiovascular: Heart sounds are unremarkable.  Patient has been bradycardic.  Normotensive.  Extremities warm. Abdomen: Left lateral incision.  Abdomen soft. Extremities: No edema. Neuro: Currently sedated. GU: Foley catheter in place with abundant clear urine.  Ancillary test personally reviewed  Acceptable ABG. Creatinine 1.49 preop. Hemoglobin 8.2 this morning. Repeat lab investigations pending. Assessment & Plan:  Hemorrhagic  shock following hypogastric artery stenting with reexploration. Postoperative ventilator management. Persistent bradycardia Massive transfusion.  At risk for coagulopathy. Peripheral vascular disease.  Plan:  -Currently no signs of active bleeding.  Follow-up hemoglobin and coagulation profile. -Maintain systolic blood pressure less than 140. -Continue propofol with as needed morphine for pain. -Hold home blood pressure medications for tonight -At risk for pulmonary edema from volume overload from fluid redistribution.  Keep intubated overnight anticipating fluid  shifts. -If doing well in the morning wean sedation and proceed to extubation. -At risk for AKI.  Judicious fluid resuscitation.  Follow creatinine.   Best Practice (right click and "Reselect all SmartList Selections" daily)   Diet/type: NPO DVT prophylaxis: not indicated GI prophylaxis: H2B Lines: Central line and Arterial Line Foley:  Yes, and it is still needed Code Status:  full code Last date of multidisciplinary goals of care discussion [per vascular surgery.]   CRITICAL CARE Performed by: Lynnell Catalan   Total critical care time: 35 minutes  Critical care time was exclusive of separately billable procedures and treating other patients.  Critical care was necessary to treat or prevent imminent or life-threatening deterioration.  Critical care was time spent personally by me on the following activities: development of treatment plan with patient and/or surrogate as well as nursing, discussions with consultants, evaluation of patient's response to treatment, examination of patient, obtaining history from patient or surrogate, ordering and performing treatments and interventions, ordering and review of laboratory studies, ordering and review of radiographic studies, pulse oximetry, re-evaluation of patient's condition and participation in multidisciplinary rounds.  Lynnell Catalan, MD [facility] ICU Physician [facility]  Pager: [phone] Mobile: [phone] After hours: [phone].

## 2022-10-05 NOTE — Interval H&P Note (Signed)
History and Physical Interval Note:  10/05/2022 7:27 AM  Lonnie Hansen  has presented today for surgery, with the diagnosis of Illiac artery anyuersem.  The various methods of treatment have been discussed with the patient and family. After consideration of risks, benefits and other options for treatment, the patient has consented to  Procedure(s): ABDOMINAL AORTOGRAM (N/A) as a surgical intervention.  The patient's history has been reviewed, patient examined, no change in status, stable for surgery.  I have reviewed the patient's chart and labs.  Questions were answered to the patient's satisfaction.     Lemar Livings

## 2022-10-05 NOTE — Significant Event (Signed)
Rapid Response Event Note   Reason for Call :  unresponsive  Initial Focused Assessment:  Arrived to find patient diaphoretic, pale and now beginning to respond/follow commands. A&Ox4. Left groin site level 0, no pain. Bilateral lower abdomen firm with 10/10 pain, no ecchymosis seen. MD Randie Heinz paged. Heart tones normal, lungs clear. Left pedal pulse 1+, no numbness/tingling. Patient states "something coming out of me," noted to be passing flatus and stool; no ecchymosis seen or hematoma felt posteriorly. Pt BP labile, SBP 50s-90; mentation to match. MD paged again, to bedside--decision to take to OR emergently.   79/67 (73) HR 55 RR 21 O2 100% 3L [place] CBG 103  Interventions:  Trendelenburg position 2nd PIV access 1L NS bolus  MD to bedside  Plan of Care:  Patient taken to OR via anesthesia. Sister at bedside updated by team.   Event Summary:  MD Notified: B. Randie Heinz MD Call Time: 1139 Arrival Time: 1140 End Time: 1240  Truddie Crumble, RN

## 2022-10-06 ENCOUNTER — Inpatient Hospital Stay: Payer: Medicare Other

## 2022-10-06 ENCOUNTER — Encounter: Payer: Self-pay | Admitting: Hematology & Oncology

## 2022-10-06 ENCOUNTER — Encounter (HOSPITAL_COMMUNITY): Payer: Self-pay | Admitting: Vascular Surgery

## 2022-10-06 DIAGNOSIS — R001 Bradycardia, unspecified: Secondary | ICD-10-CM | POA: Diagnosis not present

## 2022-10-06 LAB — POCT I-STAT 7, (LYTES, BLD GAS, ICA,H+H)
Acid-base deficit: 6 mmol/L — ABNORMAL HIGH (ref 0.0–2.0)
Acid-base deficit: 2 mmol/L (ref 0.0–2.0)
Acid-base deficit: 2 mmol/L (ref 0.0–2.0)
Bicarbonate: 20.7 mmol/L (ref 20.0–28.0)
Bicarbonate: 23.2 mmol/L (ref 20.0–28.0)
Bicarbonate: 22.3 mmol/L (ref 20.0–28.0)
Calcium, Ion: 0.97 mmol/L — ABNORMAL LOW (ref 1.15–1.40)
Calcium, Ion: 0.9 mmol/L — ABNORMAL LOW (ref 1.15–1.40)
Calcium, Ion: 1.19 mmol/L (ref 1.15–1.40)
HCT: 27 % — ABNORMAL LOW (ref 39.0–52.0)
HCT: 23 % — ABNORMAL LOW (ref 39.0–52.0)
HCT: 30 % — ABNORMAL LOW (ref 39.0–52.0)
Hemoglobin: 9.2 g/dL — ABNORMAL LOW (ref 13.0–17.0)
Hemoglobin: 7.8 g/dL — ABNORMAL LOW (ref 13.0–17.0)
Hemoglobin: 10.2 g/dL — ABNORMAL LOW (ref 13.0–17.0)
O2 Saturation: 100 %
O2 Saturation: 100 %
O2 Saturation: 100 %
Patient temperature: 34.3
Patient temperature: 34.2
Patient temperature: 37.8
Potassium: 4.8 mmol/L (ref 3.5–5.1)
Potassium: 4.3 mmol/L (ref 3.5–5.1)
Potassium: 3.9 mmol/L (ref 3.5–5.1)
Sodium: 139 mmol/L (ref 135–145)
Sodium: 141 mmol/L (ref 135–145)
Sodium: 141 mmol/L (ref 135–145)
TCO2: 22 mmol/L (ref 22–32)
TCO2: 24 mmol/L (ref 22–32)
TCO2: 23 mmol/L (ref 22–32)
pCO2 arterial: 40.6 mmHg (ref 32–48)
pCO2 arterial: 36.6 mmHg (ref 32–48)
pCO2 arterial: 37.5 mmHg (ref 32–48)
pH, Arterial: 7.302 — ABNORMAL LOW (ref 7.35–7.45)
pH, Arterial: 7.396 (ref 7.35–7.45)
pH, Arterial: 7.386 (ref 7.35–7.45)
pO2, Arterial: 234 mmHg — ABNORMAL HIGH (ref 83–108)
pO2, Arterial: 227 mmHg — ABNORMAL HIGH (ref 83–108)
pO2, Arterial: 195 mmHg — ABNORMAL HIGH (ref 83–108)

## 2022-10-06 LAB — COMPREHENSIVE METABOLIC PANEL
ALT: 13 U/L (ref 0–44)
AST: 20 U/L (ref 15–41)
Albumin: 3.3 g/dL — ABNORMAL LOW (ref 3.5–5.0)
Alkaline Phosphatase: 34 U/L — ABNORMAL LOW (ref 38–126)
Anion gap: 10 (ref 5–15)
BUN: 17 mg/dL (ref 8–23)
CO2: 22 mmol/L (ref 22–32)
Calcium: 8.6 mg/dL — ABNORMAL LOW (ref 8.9–10.3)
Chloride: 107 mmol/L (ref 98–111)
Creatinine, Ser: 1.57 mg/dL — ABNORMAL HIGH (ref 0.61–1.24)
GFR, Estimated: 47 mL/min — ABNORMAL LOW (ref >60)
Glucose, Bld: 125 mg/dL — ABNORMAL HIGH (ref 70–99)
Potassium: 3.7 mmol/L (ref 3.5–5.1)
Sodium: 139 mmol/L (ref 135–145)
Total Bilirubin: 1.5 mg/dL — ABNORMAL HIGH (ref 0.3–1.2)
Total Protein: 5.6 g/dL — ABNORMAL LOW (ref 6.5–8.1)

## 2022-10-06 LAB — CBC
HCT: 30.8 % — ABNORMAL LOW (ref 39.0–52.0)
HCT: 29 % — ABNORMAL LOW (ref 39.0–52.0)
Hemoglobin: 10.7 g/dL — ABNORMAL LOW (ref 13.0–17.0)
Hemoglobin: 9.8 g/dL — ABNORMAL LOW (ref 13.0–17.0)
MCH: 29.4 pg (ref 26.0–34.0)
MCH: 29.3 pg (ref 26.0–34.0)
MCHC: 34.7 g/dL (ref 30.0–36.0)
MCHC: 33.8 g/dL (ref 30.0–36.0)
MCV: 84.6 fL (ref 80.0–100.0)
MCV: 86.6 fL (ref 80.0–100.0)
Platelets: 70 10*3/uL — ABNORMAL LOW (ref 150–400)
Platelets: 71 10*3/uL — ABNORMAL LOW (ref 150–400)
RBC: 3.64 MIL/uL — ABNORMAL LOW (ref 4.22–5.81)
RBC: 3.35 MIL/uL — ABNORMAL LOW (ref 4.22–5.81)
RDW: 16.1 % — ABNORMAL HIGH (ref 11.5–15.5)
RDW: 16.5 % — ABNORMAL HIGH (ref 11.5–15.5)
WBC: 9.8 10*3/uL (ref 4.0–10.5)
WBC: 9.6 10*3/uL (ref 4.0–10.5)
nRBC: 0 % (ref 0.0–0.2)
nRBC: 0 % (ref 0.0–0.2)

## 2022-10-06 LAB — GLUCOSE, CAPILLARY
Glucose-Capillary: 105 mg/dL — ABNORMAL HIGH (ref 70–99)
Glucose-Capillary: 125 mg/dL — ABNORMAL HIGH (ref 70–99)
Glucose-Capillary: 107 mg/dL — ABNORMAL HIGH (ref 70–99)

## 2022-10-06 LAB — PREPARE PLATELET PHERESIS
Unit division: 0
Unit division: 0

## 2022-10-06 LAB — PREPARE CRYOPRECIPITATE: Unit division: 0

## 2022-10-06 LAB — BPAM PLATELET PHERESIS
Blood Product Expiration Date: 202409112359
Blood Product Expiration Date: 202409112359
ISSUE DATE / TIME: 202409091404
ISSUE DATE / TIME: 202409091513
Unit Type and Rh: 5100
Unit Type and Rh: 6200

## 2022-10-06 LAB — BPAM CRYOPRECIPITATE
Blood Product Expiration Date: 202409142359
ISSUE DATE / TIME: 202409091436
Unit Type and Rh: 5100

## 2022-10-06 LAB — MAGNESIUM
Magnesium: 1.6 mg/dL — ABNORMAL LOW (ref 1.7–2.4)
Magnesium: 2.2 mg/dL (ref 1.7–2.4)

## 2022-10-06 LAB — AMYLASE: Amylase: 87 U/L (ref 28–100)

## 2022-10-06 LAB — TRIGLYCERIDES: Triglycerides: 158 mg/dL — ABNORMAL HIGH (ref <150)

## 2022-10-06 MED ORDER — ORAL CARE MOUTH RINSE: 15.0000 mL | OROMUCOSAL | Status: DC | PRN

## 2022-10-06 MED ORDER — MAGNESIUM SULFATE 4 GM/100ML IV SOLN
4.0000 g | Freq: Once | INTRAVENOUS | Status: AC
  Administered 2022-10-06: 4 g via INTRAVENOUS
  Filled 2022-10-06: qty 100

## 2022-10-06 MED ORDER — POTASSIUM CHLORIDE 20 MEQ PO PACK
20.0000 meq | PACK | Freq: Once | ORAL | Status: AC
  Administered 2022-10-06: 20 meq
  Filled 2022-10-06: qty 1

## 2022-10-06 NOTE — Progress Notes (Signed)
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 Postprocedure he developed a small groin hematoma and had 2 episodes of bradycardia with hypotension down to the 50s with diaphoresis and altered sensorium.  But a unsustained run of ventricular tachycardia.Marland Kitchen  He complained of pain in the left groin.  He has a history of peripheral vascular disease with prior AAA repair.  Has chronic ITP  On reexploration no bleeding found at the puncture site.  There may have been a small self-contained puncture at the stenting site which reseal itself.  A bridging stent was placed and the operative site was washed out.  The abdomen was primarily closed.  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 states  it's a" growth on vocal cord"     Significant Hospital Events: Including procedures, antibiotic start and stop dates in addition to other pertinent events   9/9-stent of the left hypogastric artery with distal 7 x 75 mm Viabahn extended proximally  with 8 x 39 mm VBX.Stent of left external iliac artery with 9 x 29 mm VBX  9/9 open reexploration for bleeding.  No clear source found.  Interim History / Subjective:  Points to soreness in LLQ/ left groin On minimal sedation, wakes up, apneic on SBT w/RT   Objective   Blood pressure 108/63, pulse 69, temperature 99.5 F (37.5 C), resp. rate 18, height 5\' 8"  (1.727 m), weight 82.6 kg, SpO2 100%.    Vent Mode: PRVC FiO2 (%):  [40 %-100 %] 40 % Set Rate:  [18 bmp] 18 bmp Vt Set:  [550 mL] 550 mL PEEP:  [5 cmH20] 5 cmH20 Plateau Pressure:  [14 cmH20-15 cmH20] 14 cmH20   Intake/Output Summary (Last 24 hours) at 10/06/2022 0850 Last data filed at 10/06/2022 9562 Gross per 24 hour  Intake 12168.97 ml  Output 4440 ml  Net 7728.97 ml   Filed Weights   10/05/22 0801  Weight: 82.6 kg    Examination: Propofol 20> off, last given morphine 2mg  0825 General:  elderly male intubated in NAD on MV HEENT: MM pink/moist, ETT, OGT, pupils 3/r, anicteric Neuro: Awakens to verbal, f/c in all extremities, still sleepy CV: rr, no murmur, click, R internal jugular introducer, left radial aline PULM:  non labored on MV, initially apneic on SBT but once stimulated able to tolerate PSV 5/5 ~0905, clear anteriorly, no wheeze GI: soft,  hypoBS, lower midline incision/ left femoral dressing CDI, foley- cyu Extremities: warm/dry, no LE edema  Skin: no rashes  Tmax 100 UOP 470 ml/ 12hrs  Net +7.7L  Labs reviewed> ABG reassuring, K 3.7, sCr 1.13> 1.57, BUN 14> 17, Mag 1.6, t. Bili 1.5, H/H 11.6> 10.7, WBC 9.8, plts 62> 70   Assessment & Plan:  Hemorrhagic shock following hypogastric artery stenting with reexploration. Postoperative ventilator management. Persistent bradycardia, resolved Massive transfusion.  At risk for coagulopathy. Chronic ITP Peripheral vascular disease At risk for AKI Hypomagnesemia Plan: - hopeful for extubation today soon this morning.  Hold sedation, cont full MV support  as needed - prn multimodal pain management - VAP/ PPI - aggressive pulmonary hygiene - no further signs of bleeding, H/H stable, plts mildly improved.  Continue to trend CBC - cont telemetry monitoring> no further bradycardia - on very small NE requirement overnight, now awake, MAPs better, MAP goal > 65, SBP < 140 - cont CVL for now, d/c aline once off pressors - good UOP thus far.  sCr slightly up but near baseline.  Continue to closely monitor UOP/ renal function.  D/c foley  - optimize/ replete electrolytes prn> s/p KCL and Mag. Will recheck Mag at 1700 - cont to hold preadmit HTN meds    Best Practice (right click and "Reselect all SmartList Selections" daily)   Diet/type: NPO DVT prophylaxis: SCD GI prophylaxis: H2B Lines: Central line and Arterial Line Foley:  Yes, and it is still needed Code Status:  full code Last date of multidisciplinary goals of care discussion [per vascular surgery.]  Wife updated at bedside 9/10 am.   CRITICAL CARE Performed by: Posey Boyer   Total critical care time: 35 minutes   Posey Boyer, MSN, AG-ACNP-BC [facility] 10/06/2022, 8:50 AM  See Amion for pager If no response to pager , please call [phone] until 7pm After 7:00 pm call Elink  [phone]

## 2022-10-06 NOTE — Procedures (Signed)
Extubation Procedure Note  Patient Details:   Name: Lonnie Hansen DOB: October 21, 1950 MRN: [id number]   Airway Documentation:    Vent end date: 10/06/22 Vent end time: 0955   Evaluation  O2 sats: stable throughout Complications: No apparent complications Patient did tolerate procedure well. Bilateral Breath Sounds: Clear, Diminished   Pt spoke after procedure: Yes  NIF:-35 VC: 2.3L  Idelle Leech 10/06/2022, 9:58 AM

## 2022-10-06 NOTE — Anesthesia Postprocedure Evaluation (Signed)
Anesthesia Post Note  Patient: Lonnie Hansen  Procedure(s) Performed: Drucie Ip EXPOSURE, ANGIOGRAM WITH STENT PLACEMENT, RETROPERITONEAL CUTDOWN WITH REPAIR OF BLEEDING (Left: Groin)     Patient location during evaluation: PACU Anesthesia Type: General Level of consciousness: sedated and patient cooperative Pain management: pain level controlled Vital Signs Assessment: post-procedure vital signs reviewed and stable Respiratory status: spontaneous breathing Cardiovascular status: stable Anesthetic complications: no   No notable events documented.  Last Vitals:  Vitals:   10/06/22 1300 10/06/22 1400  BP: 116/67 121/77  Pulse: 74 (!) 59  Resp: (!) 8 15  Temp:    SpO2: 98% 96%    Last Pain:  Vitals:   10/06/22 1200  TempSrc:   PainSc: 0-No pain                 Lewie Loron

## 2022-10-06 NOTE — Evaluation (Signed)
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  Postprocedure he developed a small groin hematoma and had 2 episodes of bradycardia with hypotension down to the 50s with diaphoresis and altered sensorium and left groin pain. S/p re-exploration to find source of bleeing however no source found.PMH: PVD, AAA repair, chronic ITP   Clinical Impression  Pt in good spirits. Assisted with use of urinal in supine upon arrival. Pt with overall decreased activity tolerance, generalized weakness, c/o "my left leg feels like it fell asleep", requires increased assist for mobility and use of RW. Anticipate patient to progress quickly as pain is managed. Recommend HHPT upon d/c to maximize indep function in the home as he was PTA. Acute PT to cont to follow.        If plan is discharge home, recommend the following: A little help with walking and/or transfers;A little help with bathing/dressing/bathroom;Assist for transportation;Help with stairs or ramp for entrance   Can travel by private vehicle        Equipment Recommendations None recommended by PT  Recommendations for Other Services       Functional Status Assessment Patient has had a recent decline in their functional status and demonstrates the ability to make significant improvements in function in a reasonable and predictable amount of time.     Precautions / Restrictions Precautions Precautions: Fall Restrictions Weight Bearing Restrictions: No      Mobility  Bed Mobility Overal bed mobility: Needs Assistance Bed Mobility: Rolling, Sidelying to Sit Rolling: Mod assist Sidelying to sit: Mod assist       General bed mobility comments: HOB elevated, increased time, assist for L LE management due to weakness and pain from procedure. modA for trunk  elevation    Transfers Overall transfer level: Needs assistance Equipment used: Rolling walker (2 wheels) Transfers: Sit to/from Stand, Bed to chair/wheelchair/BSC Sit to Stand: Mod assist   Step pivot transfers: Mod assist       General transfer comment: rocking momentum, 3 trials prior to achieving full upright posture, modA to power up. increased time to sequence steps to chair, decreased L LE WBing due to pain, modA for walker management during turning, verbal cues to reach back for chair    Ambulation/Gait               General Gait Details: limited to steps to chair  Stairs            Wheelchair Mobility     Tilt Bed    Modified Rankin (Stroke Patients Only)       Balance Overall balance assessment: Needs assistance Sitting-balance support: Feet supported, Single extremity supported Sitting balance-Leahy Scale: Fair Sitting balance - Comments: supported self with UEs   Standing balance support: Bilateral upper extremity supported, During functional activity, Reliant on assistive device for balance Standing balance-Leahy Scale: Poor Standing balance comment: dependent on RW                             Pertinent Vitals/Pain Pain Assessment Pain Assessment: Faces Faces Pain Scale: Hurts little more Pain Location: L groin with hip flexion Pain Descriptors / Indicators: Pins and needles, Sore Pain Intervention(s): Monitored during session    Home Living Family/patient expects to be discharged to:: Private residence Living Arrangements: Spouse/significant other Available  Help at Discharge: Family;Available 24 hours/day Type of Home: House Home Access: Stairs to enter Entrance Stairs-Rails: Right Entrance Stairs-Number of Steps: 4   Home Layout: Two level;Able to live on main level with bedroom/bathroom Home Equipment: Rollator (4 wheels);Cane - single point Additional Comments: goes to [facility]    Prior Function Prior Level of  Function : Independent/Modified Independent             Mobility Comments: uses SPC and rollator for ambulation ADLs Comments: indep     Extremity/Trunk Assessment   Upper Extremity Assessment Upper Extremity Assessment: Generalized weakness    Lower Extremity Assessment Lower Extremity Assessment: Generalized weakness (L weaker than R due to recent procedure)    Cervical / Trunk Assessment Cervical / Trunk Assessment: Other exceptions (scoliosis type presentation)  Communication   Communication Communication: No apparent difficulties  Cognition Arousal: Alert Behavior During Therapy: WFL for tasks assessed/performed Overall Cognitive Status: Within Functional Limits for tasks assessed                                 General Comments: pt tangential at times, increased response time        General Comments General comments (skin integrity, edema, etc.): VSS, L groin dressing intact    Exercises     Assessment/Plan    PT Assessment Patient needs continued PT services  PT Problem List Decreased strength;Decreased activity tolerance;Decreased balance;Decreased mobility;Decreased knowledge of use of DME       PT Treatment Interventions DME instruction;Gait training;Stair training;Functional mobility training;Therapeutic activities;Therapeutic exercise;Balance training;Neuromuscular re-education    PT Goals (Current goals can be found in the Care Plan section)  Acute Rehab PT Goals Patient Stated Goal: feel better PT Goal Formulation: With patient Time For Goal Achievement: 10/20/22 Potential to Achieve Goals: Good    Frequency Min 1X/week     Co-evaluation               AM-PAC PT "6 Clicks" Mobility  Outcome Measure Help needed turning from your back to your side while in a flat bed without using bedrails?: A Lot Help needed moving from lying on your back to sitting on the side of a flat bed without using bedrails?: A Lot Help needed  moving to and from a bed to a chair (including a wheelchair)?: A Lot Help needed standing up from a chair using your arms (e.g., wheelchair or bedside chair)?: A Lot Help needed to walk in hospital room?: A Lot Help needed climbing 3-5 steps with a railing? : A Lot 6 Click Score: 12    End of Session Equipment Utilized During Treatment: Gait belt Activity Tolerance: Patient tolerated treatment well Patient left: in chair;with call bell/phone within reach;with chair alarm set Nurse Communication: Mobility status PT Visit Diagnosis: Unsteadiness on feet (R26.81);Muscle weakness (generalized) (M62.81);Difficulty in walking, not elsewhere classified (R26.2)    Time: 4782-9562 PT Time Calculation (min) (ACUTE ONLY): 36 min   Charges:   PT Evaluation $PT Eval Moderate Complexity: 1 Mod PT Treatments $Therapeutic Activity: 8-22 mins PT General Charges $$ ACUTE PT VISIT: 1 Visit         Lewis Shock, PT, DPT Acute Rehabilitation Services Secure chat preferred Office #: [phone]   [name] 10/06/2022, 1:26 PM

## 2022-10-06 NOTE — Progress Notes (Addendum)
Progress Note    10/06/2022 8:14 AM 1 Day Post-Op  Subjective:  intubated but alert and responsive. Shakes his head no when asked if he is in any pain    Vitals:   10/06/22 0715 10/06/22 0745  BP: 110/65 106/71  Pulse: 71 (!) 59  Resp: 18 19  Temp: 98.8 F (37.1 C) 99.3 F (37.4 C)  SpO2: 100% 100%    Physical Exam: General:  laying in bed comfortably, intubated and alert Cardiac:  regular, BP improving on 2 mcg Levo Lungs:  intubated and on vent Incisions:  left groin and RP incisions dressed and dry Extremities:  left DP/PT doppler signal Abdomen:  lower abdomen much softer than yesterday, nondistended, nontender  CBC    Component Value Date/Time   WBC 9.8 10/06/2022 0426   RBC 3.64 (L) 10/06/2022 0426   HGB 10.2 (L) 10/06/2022 0432   HGB 10.8 (L) 09/29/2022 1432   HGB 13.1 08/17/2018 1204   HCT 30.0 (L) 10/06/2022 0432   HCT 38.6 08/17/2018 1204   PLT 70 (L) 10/06/2022 0426   PLT 134 (L) 09/29/2022 1432   PLT 89 (LL) 08/17/2018 1204   MCV 84.6 10/06/2022 0426   MCV 92 08/17/2018 1204   MCH 29.4 10/06/2022 0426   MCHC 34.7 10/06/2022 0426   RDW 16.1 (H) 10/06/2022 0426   RDW 14.6 08/17/2018 1204   LYMPHSABS 1.1 09/29/2022 1432   LYMPHSABS 1.3 08/17/2018 1204   MONOABS 0.7 09/29/2022 1432   EOSABS 0.1 09/29/2022 1432   EOSABS 0.3 08/17/2018 1204   BASOSABS 0.0 09/29/2022 1432   BASOSABS 0.0 08/17/2018 1204    BMET    Component Value Date/Time   NA 141 10/06/2022 0432   NA 140 05/11/2019 0946   K 3.9 10/06/2022 0432   CL 107 10/06/2022 0426   CO2 22 10/06/2022 0426   GLUCOSE 125 (H) 10/06/2022 0426   BUN 17 10/06/2022 0426   BUN 17 05/11/2019 0946   CREATININE 1.57 (H) 10/06/2022 0426   CREATININE 1.49 (H) 09/29/2022 1432   CREATININE 0.75 04/10/2016 1132   CALCIUM 8.6 (L) 10/06/2022 0426   GFRNONAA 47 (L) 10/06/2022 0426   GFRNONAA 50 (L) 09/29/2022 1432   GFRAA 82 05/11/2019 0946   GFRAA >60 04/19/2018 1055    INR    Component Value  Date/Time   INR 1.3 (H) 10/05/2022 1637     Intake/Output Summary (Last 24 hours) at 10/06/2022 0814 Last data filed at 10/06/2022 3664 Gross per 24 hour  Intake 12168.97 ml  Output 4440 ml  Net 7728.97 ml      Assessment/Plan:  72 y.o. male is 1 day post op: left hypogastric and left external iliac artery stenting  Plus emergent takeback to OR for abdominal hematoma:  1.  Exploration left common femoral artery with primary repair of arteriotomy 2.  Selection of left common iliac artery and angiogram 3.  Stent of the left hypogastric artery with 7 x 59 mm VBX 4.  Open exploration of left common and external iliac arteries via retroperitoneal incision   -Doing well this morning considering extensive resuscitation required in the OR yesterday -pH has stabilized at 7.38. Acid base deficit greatly improved from 8.0>> 2.0 -Remained intubated overnight. Hopeful for extubation today -Hemodynamically stable with Hgb at 10.7. Required 10U PRBCs and 5U FFP yesterday -Abdomen is much softer this morning, no evidence of further bleeding -Weaning off pressors, currently only on Levo -LLE well perfused with DP/PT doppler signals -Scr around baseline  at 1.57. Good UO >2000cc in 24 hrs -Currently holding anticoagulation and antiplatelet therapies   Loel Dubonnet, PA-C [facility] [phone] 10/06/2022 8:14 AM   I have independently interviewed patient and examined and agree with PA assessment and plan above.  Abdomen and groin is soft no evidence of ongoing bleeding and H&H have stabilized.  Left foot is warm and well-perfused he is able to move them to command.  CCM care of this patient much appreciated.  Zaylon Bossier C. Randie Heinz, MD [facility] Office: [phone] Pager: [phone]

## 2022-10-06 NOTE — Progress Notes (Signed)
[facility] ADULT ICU REPLACEMENT PROTOCOL   The patient does apply for the [facility] Adult ICU Electrolyte Replacment Protocol based on the criteria listed below:   1.Exclusion criteria: TCTS, ECMO, Dialysis, and Myasthenia Gravis patients 2. Is GFR >/= 30 ml/min? Yes.    Patient's GFR today is 47 3. Is SCr </= 2? Yes.   Patient's SCr is 1.57 mg/dL 4. Did SCr increase >/= 0.5 in 24 hours? No. 5.Pt's weight >40kg  Yes.   6. Abnormal electrolyte(s): Mag 1.6  7. Electrolytes replaced per protocol 8.  Call MD STAT for K+ </= 2.5, Phos </= 1, or Mag </= 1 Physician:  Dr. Loralyn Freshwater, Lilia Argue 10/06/2022 6:19 AM

## 2022-10-07 DIAGNOSIS — R001 Bradycardia, unspecified: Secondary | ICD-10-CM

## 2022-10-07 LAB — HEPATIC FUNCTION PANEL
ALT: 10 U/L (ref 0–44)
AST: 19 U/L (ref 15–41)
Albumin: 3.1 g/dL — ABNORMAL LOW (ref 3.5–5.0)
Alkaline Phosphatase: 28 U/L — ABNORMAL LOW (ref 38–126)
Bilirubin, Direct: 0.3 mg/dL — ABNORMAL HIGH (ref 0.0–0.2)
Indirect Bilirubin: 1.6 mg/dL — ABNORMAL HIGH (ref 0.3–0.9)
Total Bilirubin: 1.9 mg/dL — ABNORMAL HIGH (ref 0.3–1.2)
Total Protein: 5.2 g/dL — ABNORMAL LOW (ref 6.5–8.1)

## 2022-10-07 LAB — BPAM FFP
Blood Product Expiration Date: 202409102359
Blood Product Expiration Date: 202409102359
Blood Product Expiration Date: 202409102359
Blood Product Expiration Date: 202409102359
Blood Product Expiration Date: 202409102359
Blood Product Expiration Date: 202409102359
Blood Product Expiration Date: 202409142359
Blood Product Expiration Date: 202409142359
Blood Product Expiration Date: 202409142359
Blood Product Expiration Date: 202409142359
Blood Product Expiration Date: 202409142359
Blood Product Expiration Date: 202409142359
Blood Product Expiration Date: 202409142359
Blood Product Expiration Date: 202409142359
Blood Product Expiration Date: 202409142359
Blood Product Expiration Date: 202409142359
ISSUE DATE / TIME: 202409091346
ISSUE DATE / TIME: 202409091346
ISSUE DATE / TIME: 202409091346
ISSUE DATE / TIME: 202409091346
ISSUE DATE / TIME: 202409091427
ISSUE DATE / TIME: 202409091427
ISSUE DATE / TIME: 202409091427
ISSUE DATE / TIME: 202409091427
ISSUE DATE / TIME: 202409091433
ISSUE DATE / TIME: 202409091433
ISSUE DATE / TIME: 202409091433
ISSUE DATE / TIME: 202409091433
ISSUE DATE / TIME: 202409091509
ISSUE DATE / TIME: 202409091509
ISSUE DATE / TIME: 202409091509
ISSUE DATE / TIME: 202409111531
Unit Type and Rh: 600
Unit Type and Rh: 600
Unit Type and Rh: 600
Unit Type and Rh: 600
Unit Type and Rh: 6200
Unit Type and Rh: 6200
Unit Type and Rh: 6200
Unit Type and Rh: 6200
Unit Type and Rh: 6200
Unit Type and Rh: 6200
Unit Type and Rh: 6200
Unit Type and Rh: 6200
Unit Type and Rh: 8400
Unit Type and Rh: 8400
Unit Type and Rh: 8400
Unit Type and Rh: 8400

## 2022-10-07 LAB — PREPARE FRESH FROZEN PLASMA
Unit division: 0
Unit division: 0
Unit division: 0
Unit division: 0
Unit division: 0
Unit division: 0
Unit division: 0
Unit division: 0
Unit division: 0
Unit division: 0
Unit division: 0
Unit division: 0
Unit division: 0
Unit division: 0

## 2022-10-07 LAB — BASIC METABOLIC PANEL
Anion gap: 10 (ref 5–15)
BUN: 15 mg/dL (ref 8–23)
CO2: 25 mmol/L (ref 22–32)
Calcium: 8.3 mg/dL — ABNORMAL LOW (ref 8.9–10.3)
Chloride: 105 mmol/L (ref 98–111)
Creatinine, Ser: 1.29 mg/dL — ABNORMAL HIGH (ref 0.61–1.24)
GFR, Estimated: 59 mL/min — ABNORMAL LOW (ref >60)
Glucose, Bld: 105 mg/dL — ABNORMAL HIGH (ref 70–99)
Potassium: 3.5 mmol/L (ref 3.5–5.1)
Sodium: 140 mmol/L (ref 135–145)

## 2022-10-07 LAB — CBC
HCT: 26.3 % — ABNORMAL LOW (ref 39.0–52.0)
Hemoglobin: 8.8 g/dL — ABNORMAL LOW (ref 13.0–17.0)
MCH: 29.5 pg (ref 26.0–34.0)
MCHC: 33.5 g/dL (ref 30.0–36.0)
MCV: 88.3 fL (ref 80.0–100.0)
Platelets: 66 10*3/uL — ABNORMAL LOW (ref 150–400)
RBC: 2.98 MIL/uL — ABNORMAL LOW (ref 4.22–5.81)
RDW: 16.2 % — ABNORMAL HIGH (ref 11.5–15.5)
WBC: 8.8 10*3/uL (ref 4.0–10.5)
nRBC: 0 % (ref 0.0–0.2)

## 2022-10-07 LAB — MAGNESIUM: Magnesium: 1.9 mg/dL (ref 1.7–2.4)

## 2022-10-07 LAB — HEMOGLOBIN AND HEMATOCRIT, BLOOD
HCT: 28.4 % — ABNORMAL LOW (ref 39.0–52.0)
Hemoglobin: 9.2 g/dL — ABNORMAL LOW (ref 13.0–17.0)

## 2022-10-07 MED ORDER — TRAMADOL HCL 50 MG PO TABS
50.0000 mg | ORAL_TABLET | Freq: Four times a day (QID) | ORAL | Status: DC | PRN
  Administered 2022-10-07 – 2022-10-08 (×3): 50 mg via ORAL
  Filled 2022-10-07 (×3): qty 1

## 2022-10-07 MED ORDER — MORPHINE SULFATE (PF) 2 MG/ML IV SOLN: 2.0000 mg | INTRAVENOUS | Status: DC | PRN

## 2022-10-07 MED ORDER — MAGNESIUM SULFATE 2 GM/50ML IV SOLN
INTRAVENOUS | Status: AC
  Administered 2022-10-07: 2 g via INTRAVENOUS
  Filled 2022-10-07: qty 50

## 2022-10-07 MED ORDER — POTASSIUM CHLORIDE CRYS ER 20 MEQ PO TBCR
40.0000 meq | EXTENDED_RELEASE_TABLET | Freq: Once | ORAL | Status: AC
  Administered 2022-10-07: 40 meq via ORAL
  Filled 2022-10-07: qty 2

## 2022-10-07 MED ORDER — MAGNESIUM SULFATE 2 GM/50ML IV SOLN
2.0000 g | Freq: Once | INTRAVENOUS | Status: AC
  Filled 2022-10-07: qty 50

## 2022-10-07 MED ORDER — ATENOLOL 25 MG PO TABS
12.5000 mg | ORAL_TABLET | Freq: Two times a day (BID) | ORAL | Status: DC
  Administered 2022-10-07 – 2022-10-09 (×5): 12.5 mg via ORAL
  Filled 2022-10-07 (×2): qty 0.5
  Filled 2022-10-07: qty 1
  Filled 2022-10-07: qty 0.5
  Filled 2022-10-07: qty 1

## 2022-10-07 MED ORDER — FUROSEMIDE 10 MG/ML IJ SOLN
40.0000 mg | Freq: Once | INTRAMUSCULAR | Status: AC
  Administered 2022-10-07: 40 mg via INTRAVENOUS
  Filled 2022-10-07: qty 4

## 2022-10-07 NOTE — Progress Notes (Signed)
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 Postprocedure he developed a small groin hematoma and had 2 episodes of bradycardia with hypotension down to the 50s with diaphoresis and altered sensorium.  But a unsustained run of ventricular tachycardia.Marland Kitchen  He complained of pain in the left groin.  He has a history of peripheral vascular disease with prior AAA repair.  Has chronic ITP  On reexploration no bleeding found at the puncture site.  There may have been a small self-contained puncture at the stenting site which reseal itself.  A bridging stent was placed and the operative site was washed out.  The abdomen was primarily closed.  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 states  it's a" growth on vocal cord"     Significant Hospital Events: Including procedures, antibiotic start and stop dates in addition to other pertinent events   9/9-stent of the left hypogastric artery with distal 7 x 75 mm Viabahn extended proximally  with 8 x 39 mm VBX.Stent of left external iliac artery with 9 x 29 mm VBX  9/9 open reexploration for bleeding.  No clear source found. 9/10 extubated, off pressors, CBC stable  Interim History / Subjective:  C/o of some numbness in LLE No BM yet, pt "afraid to pass gas" doesn't want it to be bloody RN reports frequent ectopy overnight  Objective   Blood pressure 117/67, pulse 65, temperature 98.3 F (36.8 C), temperature source Oral, resp. rate (!) 24, height 5\' 8"  (1.727 m), weight 82.6 kg, SpO2 96%.        Intake/Output Summary (Last 24 hours) at 10/07/2022 0750 Last data filed at 10/07/2022 0700 Gross per 24 hour  Intake 1576.57 ml  Output 1310 ml  Net 266.57 ml   Filed Weights   10/05/22 0801  Weight: 82.6 kg    Examination: General:  Pleasant elderly male sitting upright in bed in NAD HEENT: MM pale/moist, poor dentition Neuro:  Alert, oriented, MAE CV: rr ir, frequent PVCs, +click, R internal jugular CVL PULM:  non labored, clear and diminished in bases, RA GI: soft, mild tenderness at LLQ incision and left femoral site, site with old minor staining Extremities: warm/dry, LLE warm, +dp, no edema  Skin: no rashes   afebrile UOP 800 ml/ 12hrs  Net +7.9L  Labs reviewed> K 3.5, sCr 1.13> 1.57> 1.29, BUN 15, t. Bili  1.5> 1.9, indirect bili 1.6, H/H 11.6> 10.7> 9.8> 8.8, WBC 8.8, plts 62> 70> 71> 66   Assessment & Plan:  Hemorrhagic shock following hypogastric artery stenting with reexploration. Postoperative ventilator management, resolved Persistent bradycardia, resolved Massive transfusion.  At risk for coagulopathy. Chronic ITP Peripheral vascular disease At risk for AKI Hypomagnesemia, resolved Plan: - post op per VVS, cont neurovascular checks - aggressive pulmonary hygiene- IS, mobilize with PT, remains on room air - 1gm drop in Hgb, likely fluid shifts, no concern for active bleeding, remains hemodynamically stable.  Recheck this afternoon - monitor plt  trend - trend CBC, transfuse for Hgb < 7 - MAP goal > 65, SBP < 140  - tele monitoring - optimize K > 4 and Mag >2 - add back home atenolol 12.5mg  BID - stop MIVF - d/c CVL - cont to trend renal indices- sCr improving, great UOP - continue clear liquids till passing flatus> needs to mobilize, PT following - transferring out of ICU today   Best Practice (right click and "Reselect all SmartList Selections" daily)   Diet/type: clear liquids DVT prophylaxis: SCD GI prophylaxis: H2B Lines: Central line and No longer needed.  Order written to d/c  Foley:  N/A Code Status:  full code Last date of multidisciplinary goals of care discussion [per vascular surgery.]     Posey Boyer, MSN, AG-ACNP-BC  [facility] 10/07/2022, 9:02 AM  See Amion for pager If no response to pager , please call [phone] until 7pm After 7:00 pm call Elink  [phone]

## 2022-10-07 NOTE — Progress Notes (Addendum)
Progress Note    10/07/2022 9:04 AM 2 Days Post-Op  Subjective:  feeling better. Has some numbness/stinging in the left leg. Afraid to pass gas because he doesn't want to have bloody stool    Vitals:   10/07/22 0600 10/07/22 0700  BP: 106/62 117/67  Pulse: 79 65  Resp: 20 (!) 24  Temp:    SpO2: 98% 96%    Physical Exam: General:  laying comfortably in bed Cardiac:  regular rate with frequent PVCs Lungs:  nonlabored, on RA Incisions:  RP and left groin incisions intact and dry Extremities:  2+ left DP Abdomen:  soft, NT, ND  CBC    Component Value Date/Time   WBC 8.8 10/07/2022 0309   RBC 2.98 (L) 10/07/2022 0309   HGB 8.8 (L) 10/07/2022 0309   HGB 10.8 (L) 09/29/2022 1432   HGB 13.1 08/17/2018 1204   HCT 26.3 (L) 10/07/2022 0309   HCT 38.6 08/17/2018 1204   PLT 66 (L) 10/07/2022 0309   PLT 134 (L) 09/29/2022 1432   PLT 89 (LL) 08/17/2018 1204   MCV 88.3 10/07/2022 0309   MCV 92 08/17/2018 1204   MCH 29.5 10/07/2022 0309   MCHC 33.5 10/07/2022 0309   RDW 16.2 (H) 10/07/2022 0309   RDW 14.6 08/17/2018 1204   LYMPHSABS 1.1 09/29/2022 1432   LYMPHSABS 1.3 08/17/2018 1204   MONOABS 0.7 09/29/2022 1432   EOSABS 0.1 09/29/2022 1432   EOSABS 0.3 08/17/2018 1204   BASOSABS 0.0 09/29/2022 1432   BASOSABS 0.0 08/17/2018 1204    BMET    Component Value Date/Time   NA 140 10/07/2022 0309   NA 140 05/11/2019 0946   K 3.5 10/07/2022 0309   CL 105 10/07/2022 0309   CO2 25 10/07/2022 0309   GLUCOSE 105 (H) 10/07/2022 0309   BUN 15 10/07/2022 0309   BUN 17 05/11/2019 0946   CREATININE 1.29 (H) 10/07/2022 0309   CREATININE 1.49 (H) 09/29/2022 1432   CREATININE 0.75 04/10/2016 1132   CALCIUM 8.3 (L) 10/07/2022 0309   GFRNONAA 59 (L) 10/07/2022 0309   GFRNONAA 50 (L) 09/29/2022 1432   GFRAA 82 05/11/2019 0946   GFRAA >60 04/19/2018 1055    INR    Component Value Date/Time   INR 1.3 (H) 10/05/2022 1637     Intake/Output Summary (Last 24 hours) at  10/07/2022 0904 Last data filed at 10/07/2022 0700 Gross per 24 hour  Intake 1576.57 ml  Output 1310 ml  Net 266.57 ml      Assessment/Plan:  72 y.o. male is 2 days post op, s/p: left hypogastric and left external iliac artery stenting   Plus emergent takeback to OR for abdominal hematoma:   1.  Exploration left common femoral artery with primary repair of arteriotomy 2.  Selection of left common iliac artery and angiogram 3.  Stent of the left hypogastric artery with 7 x 59 mm VBX 4.  Open exploration of left common and external iliac arteries via retroperitoneal incision   -Making good improvements. Extubated yesterday and breathing well on RA -Abdomen remains soft and nontender -L groin and RP incisions intact and dry -LLE warm and well perfused with palpable 2+ DP -Tolerating a liquid diet so far. Feels like he has to pass flatus but has not yet. No N/V -Scr improved at 1.29 -Hgb slight drop to 8.8. No signs of active bleeding but will recheck around noon] -BP improved, no longer on pressors. Having frequent PVCs. Will restart home Atenolol -Clear to  mobilize as tolerated with PT/OT   Loel Dubonnet, PA-C [facility] [phone] 10/07/2022 9:04 AM  I have independently interviewed and examined patient and agree with PA assessment and plan above.   Elaina Cara C. Randie Heinz, MD [facility] [place] Office: [phone] Pager: [phone]

## 2022-10-07 NOTE — Progress Notes (Signed)
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  Postprocedure he developed a small groin hematoma and had 2 episodes of bradycardia with hypotension down to the 50s with diaphoresis and altered sensorium and left groin pain. S/p re-exploration to find source of bleeing however no source found.PMH: PVD, AAA repair, chronic ITP    PT Comments  Pt received after large episode of diarrhea. With max encouragement pt agreeable to ambulate with PT. Pt functioning at [facility] for bed mobility and minA with increased UE dependency on RW. Pt continues with generalized weakness and deconditioning but I anticipate pt to progress well and be able to d/c home with spouse and [facility] services with use of RW. Acute PT to cont to follow.   If plan is discharge home, recommend the following: A little help with walking and/or transfers;A little help with bathing/dressing/bathroom;Assist for transportation;Help with stairs or ramp for entrance   Can travel by private vehicle        Equipment Recommendations  None recommended by PT    Recommendations for Other Services       Precautions / Restrictions Precautions Precautions: Fall Restrictions Weight Bearing Restrictions: No     Mobility  Bed Mobility Overal bed mobility: Needs Assistance Bed Mobility: Supine to Sit     Supine to sit: Mod assist     General bed mobility comments: HOB elevated, increased time, assist required for L LE management out of bed    Transfers Overall transfer level: Needs assistance Equipment used: Rolling walker (2 wheels) Transfers: Sit to/from Stand, Bed to chair/wheelchair/BSC Sit to Stand: Mod assist   Step pivot transfers: Min assist       General transfer comment: modA to power up initially but minA the second time pushing up from  chair    Ambulation/Gait Ambulation/Gait assistance: Min assist Gait Distance (Feet): 190 Feet Assistive device: Rolling walker (2 wheels) Gait Pattern/deviations: Step-through pattern, Decreased stride length, Trunk flexed Gait velocity: dec Gait velocity interpretation: <1.31 ft/sec, indicative of household ambulator   General Gait Details: pt with increased UE dependency requiring verbal cues to rely more on LEs   Stairs             Wheelchair Mobility     Tilt Bed    Modified Rankin (Stroke Patients Only)       Balance Overall balance assessment: Needs assistance Sitting-balance support: Feet supported, Single extremity supported Sitting balance-Leahy Scale: Fair Sitting balance - Comments: supported self with UEs   Standing balance support: Bilateral upper extremity supported, During functional activity, Reliant on assistive device for balance Standing balance-Leahy Scale: Poor Standing balance comment: dependent on RW                            Cognition Arousal: Alert Behavior During Therapy: WFL for tasks assessed/performed Overall Cognitive Status: Within Functional Limits for tasks assessed                                 General Comments: pt responsive to encouragement given by PT        Exercises      General Comments General comments (skin integrity, edema, etc.): VSS, assisted to [facility] for BM      Pertinent Vitals/Pain  Pain Assessment Pain Assessment: Faces Faces Pain Scale: Hurts little more Pain Location: lower abdomen Pain Descriptors / Indicators: Sore Pain Intervention(s): Monitored during session    Home Living                          Prior Function            PT Goals (current goals can now be found in the care plan section) Acute Rehab PT Goals PT Goal Formulation: With patient Time For Goal Achievement: 10/20/22 Potential to Achieve Goals: Good Progress towards PT goals:  Progressing toward goals    Frequency    Min 1X/week      PT Plan      Co-evaluation              AM-PAC PT "6 Clicks" Mobility   Outcome Measure  Help needed turning from your back to your side while in a flat bed without using bedrails?: A Little Help needed moving from lying on your back to sitting on the side of a flat bed without using bedrails?: A Little Help needed moving to and from a bed to a chair (including a wheelchair)?: A Little Help needed standing up from a chair using your arms (e.g., wheelchair or bedside chair)?: A Little Help needed to walk in hospital room?: A Little Help needed climbing 3-5 steps with a railing? : A Lot 6 Click Score: 17    End of Session Equipment Utilized During Treatment: Gait belt Activity Tolerance: Patient tolerated treatment well Patient left:  (on BSC with call light, RN aware and outside room) Nurse Communication: Mobility status PT Visit Diagnosis: Unsteadiness on feet (R26.81);Muscle weakness (generalized) (M62.81);Difficulty in walking, not elsewhere classified (R26.2)     Time: 1132-1201 PT Time Calculation (min) (ACUTE ONLY): 29 min  Charges:    $Gait Training: 8-22 mins $Therapeutic Activity: 8-22 mins PT General Charges $$ ACUTE PT VISIT: 1 Visit                     Lewis Shock, PT, DPT Acute Rehabilitation Services Secure chat preferred Office #: [phone]    Lonnie Hansen 10/07/2022, 1:58 PM

## 2022-10-07 NOTE — Progress Notes (Signed)
PCCM follow up  - repeat H/H stable.   - Mag 1.9  P:  - stable for tx out of ICU.  TRH will assume medical management on 9/12  - Mag 2gm now, repeat Mag in am - d/c CVL        Posey Boyer, MSN, AG-ACNP-BC [facility] 10/07/2022, 1:09 PM  See Amion for pager If no response to pager , please call [phone] until 7pm After 7:00 pm call Elink  [phone]

## 2022-10-07 NOTE — TOC Initial Note (Signed)
Transition of Care [facility]) - Initial/Assessment Note    Patient Details  Name: Lonnie Hansen MRN: [id number] Date of Birth: [birth year]  [facility] CM/SW Contact:    Gala Lewandowsky, RN Phone Number: 10/07/2022, 11:17 AM  Clinical Narrative: Patient POD-2 left hypogastric and left external iliac artery stenting. PTA patient was from home with spouse. Patient has insurance and PCP; states he gets to appointments without any issues. Patient has DME cane- states was in the room; RN to call spouse to see if she has it at home. Case Manager will continue to follow for transition of care needs as the patient progresses.                  Expected Discharge Plan: Home w Home Health Services Barriers to Discharge: Continued Medical Work up   Patient Goals and CMS Choice Patient states their goals for this hospitalization and ongoing recovery are:: to return home   Expected Discharge Plan and Services In-house Referral: NA Discharge Planning Services: CM Consult   Living arrangements for the past 2 months: Single Family Home Expected Discharge Date: 10/05/22                 DME Agency: NA   Prior Living Arrangements/Services Living arrangements for the past 2 months: Single Family Home Lives with:: Spouse Patient language and need for interpreter reviewed:: Yes        Need for Family Participation in Patient Care: Yes (Comment) Care giver support system in place?: Yes (comment) Current home services: DME (Patient reports he has a cane) Criminal Activity/Legal Involvement Pertinent to Current Situation/Hospitalization: No - Comment as needed   Permission Sought/Granted Permission sought to share information with : Case Manager, Family Supports    Emotional Assessment Appearance:: Appears stated age Attitude/Demeanor/Rapport: Engaged Affect (typically observed): Appropriate Orientation: : Oriented to Self, Oriented to  Time, Oriented to Place Alcohol /  Substance Use: Not Applicable Psych Involvement: No (comment)  Admission diagnosis:  Groin injury [S39.91XA] Intra-abdominal bleeding [R58] Patient Active Problem List   Diagnosis Date Noted   Groin injury 10/05/2022   Intra-abdominal bleeding 10/05/2022   AAA (abdominal aortic aneurysm) (HCC) 11/28/2021   Acute blood loss anemia (ABLA) 08/21/2021   Hyperkalemia 08/21/2021   Acute cholangitis 08/21/2021   Choledocholithiasis 08/17/2021   Elevated LFTs 08/16/2021   GI bleed 05/26/2021   Diverticulosis of intestine with bleeding 05/10/2021   Cholecystitis 12/18/2020   Hemorrhagic shock (HCC)    AKI (acute kidney injury) (HCC)    Cardiac arrest (HCC)    GIB (gastrointestinal bleeding) 05/24/2020   Symptomatic ABLA due to lower GI bleed 04/14/2019   Unilateral primary osteoarthritis, 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   OSA on CPAP 03/04/2017   Vocal cord nodule    Nocturia    Joint swelling    Joint pain    Hypertension    History of kidney stones    History of colon polyps    Headache    Glaucoma    GERD    Enlarged prostate    Aneurysm of infrarenal abdominal aorta (HCC)    Diverticulosis    Coronary artery disease    Cataracts, bilateral    Bruising    Blood dyscrasia    Arthritis    Anemia    Hydropneumothorax 10/29/2016   S/P AVR 09/15/2016   History of CAD 04/29/2016   Aortic insufficiency 04/01/2016   Thoracic aortic aneurysm (HCC) 09/18/2015  HTN (hypertension) 09/18/2015   Hyperlipidemia 09/18/2015   Asymptomatic bilateral carotid artery stenosis 08/27/2015   PCP:  [name], MD Pharmacy:   [facility] [address], [place] - [address] [place] [address] Phone: [phone] Fax: [phone]  [facility] [address] [place] [address] Phone: [phone] Fax: [phone]  Social Determinants of  Health (SDOH) Social History: SDOH Screenings   Food Insecurity: No Food Insecurity (11/28/2021)  Housing: Low Risk  (11/28/2021)  Transportation Needs: No Transportation Needs (11/28/2021)  Utilities: Not At Risk (11/28/2021)  Depression (PHQ2-9): Low Risk  (05/30/2020)  Tobacco Use: Medium Risk (09/14/2022)   Readmission Risk Interventions    08/19/2021   11:21 AM  Readmission Risk Prevention Plan  Post Dischage Appt Complete  Medication Screening Complete  Transportation Screening Complete

## 2022-10-08 DIAGNOSIS — S3991XD Unspecified injury of abdomen, subsequent encounter: Secondary | ICD-10-CM

## 2022-10-08 LAB — BASIC METABOLIC PANEL
Anion gap: 10 (ref 5–15)
BUN: 18 mg/dL (ref 8–23)
CO2: 28 mmol/L (ref 22–32)
Calcium: 8.4 mg/dL — ABNORMAL LOW (ref 8.9–10.3)
Chloride: 100 mmol/L (ref 98–111)
Creatinine, Ser: 1.24 mg/dL (ref 0.61–1.24)
GFR, Estimated: >60 mL/min (ref >60)
Glucose, Bld: 114 mg/dL — ABNORMAL HIGH (ref 70–99)
Potassium: 3.4 mmol/L — ABNORMAL LOW (ref 3.5–5.1)
Sodium: 138 mmol/L (ref 135–145)

## 2022-10-08 LAB — CBC
HCT: 25.9 % — ABNORMAL LOW (ref 39.0–52.0)
Hemoglobin: 8.8 g/dL — ABNORMAL LOW (ref 13.0–17.0)
MCH: 30.4 pg (ref 26.0–34.0)
MCHC: 34 g/dL (ref 30.0–36.0)
MCV: 89.6 fL (ref 80.0–100.0)
Platelets: 80 10*3/uL — ABNORMAL LOW (ref 150–400)
RBC: 2.89 MIL/uL — ABNORMAL LOW (ref 4.22–5.81)
RDW: 15.2 % (ref 11.5–15.5)
WBC: 9.2 10*3/uL (ref 4.0–10.5)
nRBC: 0 % (ref 0.0–0.2)

## 2022-10-08 LAB — MAGNESIUM: Magnesium: 1.9 mg/dL (ref 1.7–2.4)

## 2022-10-08 MED ORDER — HEPARIN SODIUM (PORCINE) 5000 UNIT/ML IJ SOLN
5000.0000 [IU] | Freq: Three times a day (TID) | INTRAMUSCULAR | Status: DC
  Administered 2022-10-08 – 2022-10-09 (×4): 5000 [IU] via SUBCUTANEOUS
  Filled 2022-10-08 (×4): qty 1

## 2022-10-08 MED ORDER — MAGNESIUM SULFATE 2 GM/50ML IV SOLN
2.0000 g | Freq: Once | INTRAVENOUS | Status: AC
  Administered 2022-10-08: 2 g via INTRAVENOUS
  Filled 2022-10-08: qty 50

## 2022-10-08 MED ORDER — LATANOPROST 0.005 % OP SOLN
1.0000 [drp] | Freq: Two times a day (BID) | OPHTHALMIC | Status: DC
  Administered 2022-10-08 – 2022-10-09 (×2): 1 [drp] via OPHTHALMIC
  Filled 2022-10-08: qty 2.5

## 2022-10-08 MED ORDER — POTASSIUM CHLORIDE CRYS ER 20 MEQ PO TBCR
40.0000 meq | EXTENDED_RELEASE_TABLET | ORAL | Status: AC
  Administered 2022-10-08 (×2): 40 meq via ORAL
  Filled 2022-10-08 (×2): qty 2

## 2022-10-08 NOTE — Progress Notes (Signed)
eLink Physician-Brief Progress Note Patient Name: Lonnie Hansen DOB: 1950-05-06 MRN: [id number]   Date of Service  10/08/2022  HPI/Events of Note  Lab has still not come to draw blood for CBC and Mg++  eICU Interventions  Bedside RN instructed to call the lab again to come and draw specimens stat.        Migdalia Dk 10/08/2022, 5:39 AM

## 2022-10-08 NOTE — Progress Notes (Signed)
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  Postprocedure he developed a small groin hematoma and had 2 episodes of bradycardia with hypotension down to the 50s with diaphoresis and altered sensorium and left groin pain. S/p re-exploration to find source of bleeing however no source found.PMH: PVD, AAA repair, chronic ITP    PT Comments  Pt is progressing steadily toward goals, still limited by general LE/lower truncal weakness and pain.  Emphasis on transitions with minimal assist, sit to stands and progression of gait stability and stamina.   If plan is discharge home, recommend the following: A little help with walking and/or transfers;A little help with bathing/dressing/bathroom;Assist for transportation;Help with stairs or ramp for entrance   Can travel by private vehicle        Equipment Recommendations  None recommended by PT    Recommendations for Other Services       Precautions / Restrictions Precautions Precautions: Fall     Mobility  Bed Mobility Overal bed mobility: Needs Assistance Bed Mobility: Supine to Sit   Sidelying to sit: Mod assist Supine to sit: Min assist     General bed mobility comments: sore abdomen/groin coming up, needing mod assist up/forward,  retruned to supine with minimal assist .    Transfers Overall transfer level: Needs assistance Equipment used: Rolling walker (2 wheels) Transfers: Sit to/from Stand Sit to Stand: Min assist           General transfer comment: cues for safer hand placement    Ambulation/Gait Ambulation/Gait assistance: Contact guard assist Gait Distance (Feet): 380 Feet Assistive device: Rolling walker (2 wheels) Gait Pattern/deviations: Step-through pattern, Decreased stride length, Trunk flexed Gait velocity: dec Gait  velocity interpretation: <1.8 ft/sec, indicate of risk for recurrent falls   General Gait Details: generally steady, improving with time up.  cues for postural check and decrease of so much UE assist on the RW   Stairs             Wheelchair Mobility     Tilt Bed    Modified Rankin (Stroke Patients Only)       Balance     Sitting balance-Leahy Scale: Fair       Standing balance-Leahy Scale: Poor Standing balance comment: dependent on RW                            Cognition Arousal: Alert Behavior During Therapy: WFL for tasks assessed/performed Overall Cognitive Status: Within Functional Limits for tasks assessed                                          Exercises      General Comments General comments (skin integrity, edema, etc.): vss      Pertinent Vitals/Pain Pain Assessment Pain Assessment: Faces Faces Pain Scale: Hurts little more Pain Location: L foot, R hip, L groin. Pain Descriptors / Indicators: Discomfort Pain Intervention(s): Monitored during session    Home Living                          Prior Function            PT Goals (current goals can  now be found in the care plan section) Acute Rehab PT Goals PT Goal Formulation: With patient Time For Goal Achievement: 10/20/22 Potential to Achieve Goals: Good Progress towards PT goals: Progressing toward goals    Frequency    Min 1X/week      PT Plan      Co-evaluation              AM-PAC PT "6 Clicks" Mobility   Outcome Measure  Help needed turning from your back to your side while in a flat bed without using bedrails?: A Little Help needed moving from lying on your back to sitting on the side of a flat bed without using bedrails?: A Lot Help needed moving to and from a bed to a chair (including a wheelchair)?: A Little Help needed standing up from a chair using your arms (e.g., wheelchair or bedside chair)?: A Little Help needed  to walk in hospital room?: A Little Help needed climbing 3-5 steps with a railing? : A Lot 6 Click Score: 16    End of Session   Activity Tolerance: Patient tolerated treatment well Patient left: in bed;with call bell/phone within reach;with bed alarm set Nurse Communication: Mobility status PT Visit Diagnosis: Unsteadiness on feet (R26.81);Muscle weakness (generalized) (M62.81);Difficulty in walking, not elsewhere classified (R26.2)     Time: 1610-9604 PT Time Calculation (min) (ACUTE ONLY): 37 min  Charges:    $Gait Training: 8-22 mins $Therapeutic Activity: 8-22 mins PT General Charges $$ ACUTE PT VISIT: 1 Visit                     10/08/2022  Jacinto Halim., PT Acute Rehabilitation Services [phone]  (office)   Lonnie Hansen 10/08/2022, 3:46 PM

## 2022-10-08 NOTE — Progress Notes (Signed)
PROGRESS NOTE  Lonnie Hansen WUJ:[id number] DOB: [birth year] DOA: 10/05/2022 PCP: [name], MD   LOS: 3 days   Brief Narrative / Interim history: 72 year old male with history of CAD, chronic ITP, admitted electively for left hypogastric artery aneurysm stenting, s/p operative repair on 9/9 by Dr. Randie Heinz.  Postoperative course complicated by hypotension acute blood loss anemia, and he was taken back to the OR on 9/9 and a bleeding area was identified on the pubic tubercle / retroperitoneal area, status post cautery/clipping.  He was observed in the ICU during this time, and upon moving to progressive hospitalist team was consulted to assist with medical management  Subjective / 24h Interval events: Doing well this morning, no abdominal pain, no nausea or vomiting.  Assesement and Plan: Principal Problem:   Groin injury Active Problems:   Intra-abdominal bleeding   Principal problem Hemorrhagic shock -required pressors while in the ICU, shock physiology has resolved.  Managed by vascular surgery as above -Received several units of blood, cryoprecipitate, FFP as well as platelets.  Counts currently stable, hemoglobin in the 11 range.  Continue to monitor  Active problems Left hypogastric artery aneurysm-status post stenting.  Defer aspirin/Plavix to vascular  CKD 3A-baseline creatinine 1.3-1.5, currently at baseline  ITP-platelets are stable, 62  Essential hypertension-atenolol was reintroduced at half of the home dose, blood pressure seems to be stable today in the 1 teens-120s.  At home he is also taking amlodipine and losartan/HCTZ, continue to hold for now  Hyperlipidemia-continue statin    Scheduled Meds:  atenolol  12.5 mg Oral BID   Chlorhexidine Gluconate Cloth  6 each Topical Daily   docusate sodium  100 mg Oral Daily   heparin injection (subcutaneous)  5,000 Units Subcutaneous Q8H   pantoprazole  40 mg Oral Daily   potassium chloride  40 mEq Oral Q4H    rosuvastatin  20 mg Oral Daily   Continuous Infusions:  sodium chloride     magnesium sulfate bolus IVPB 2 g (10/08/22 1032)   PRN Meds:.sodium chloride, acetaminophen **OR** acetaminophen, alum & mag hydroxide-simeth, guaiFENesin-dextromethorphan, hydrALAZINE, labetalol, metoprolol tartrate, ondansetron, mouth rinse, phenol, traMADol  Current Outpatient Medications  Medication Instructions   amLODipine (NORVASC) 10 mg, Oral, Daily   atenolol (TENORMIN) 12.5 mg, Oral, 2 times daily   clopidogrel (PLAVIX) 75 mg, Oral, Daily   latanoprost (XALATAN) 0.005 % ophthalmic solution 1 drop, Both Eyes, 2 times daily   losartan-hydrochlorothiazide (HYZAAR) 100-12.5 MG tablet 1 tablet, Oral, Daily   Multiple Vitamin (MULTIVITAMIN WITH MINERALS) TABS tablet 1 tablet, Oral, Daily   pantoprazole (PROTONIX) 40 mg, Oral, 2 times daily   rosuvastatin (CRESTOR) 20 mg, Oral, Daily    Diet Orders (From admission, onward)     Start     Ordered   10/07/22 1325  Diet Heart Room service appropriate? Yes; Fluid consistency: Thin  Diet effective now       Question Answer Comment  Room service appropriate? Yes   Fluid consistency: Thin      10/07/22 1324            DVT prophylaxis: heparin injection 5,000 Units Start: 10/08/22 1400 SCD's Start: 10/05/22 1627   Lab Results  Component Value Date   PLT 80 (L) 10/08/2022      Code Status: Full Code  Family Communication: no family at bedside   Level of care: Progressive   Objective: Vitals:   10/08/22 0700 10/08/22 0800 10/08/22 0830 10/08/22 0900  BP: 115/63 128/78  117/67  Pulse: 64 79  66  Resp: 16 16  14   Temp:  98.2 F (36.8 C) 98.9 F (37.2 C)   TempSrc:  Oral Oral   SpO2: 97% 98%  97%  Weight:      Height:        Intake/Output Summary (Last 24 hours) at 10/08/2022 1104 Last data filed at 10/08/2022 0600 Gross per 24 hour  Intake --  Output 1700 ml  Net -1700 ml   Wt Readings from Last 3 Encounters:  10/05/22 82.6 kg   09/14/22 85.3 kg  09/09/22 85.6 kg    Examination:  Constitutional: NAD Eyes: no scleral icterus ENMT: Mucous membranes are moist.  Neck: normal, supple Respiratory: clear to auscultation bilaterally, no wheezing, no crackles. Normal respiratory effort. Cardiovascular: Regular rate and rhythm, no murmurs / rubs / gallops. No LE edema.  Abdomen: non distended, no tenderness. Bowel sounds positive.  Musculoskeletal: no clubbing / cyanosis.    Data Reviewed: I have independently reviewed following labs and imaging studies   CBC Recent Labs  Lab 10/05/22 1637 10/05/22 1659 10/06/22 0426 10/06/22 0432 10/06/22 1700 10/07/22 0309 10/07/22 1102 10/08/22 0606  WBC 9.1  --  9.8  --  9.6 8.8  --  9.2  HGB 11.6*   < > 10.7* 10.2* 9.8* 8.8* 9.2* 8.8*  HCT 32.6*   < > 30.8* 30.0* 29.0* 26.3* 28.4* 25.9*  PLT 62*  --  70*  --  71* 66*  --  80*  MCV 87.6  --  84.6  --  86.6 88.3  --  89.6  MCH 31.2  --  29.4  --  29.3 29.5  --  30.4  MCHC 35.6  --  34.7  --  33.8 33.5  --  34.0  RDW 15.3  --  16.1*  --  16.5* 16.2*  --  15.2   < > = values in this interval not displayed.    Recent Labs  Lab 10/05/22 1637 10/05/22 1659 10/06/22 0426 10/06/22 0432 10/06/22 1700 10/07/22 0309 10/08/22 0606  NA 136 140 139 141  --  140 138  K 3.3* 3.5 3.7 3.9  --  3.5 3.4*  CL 104  --  107  --   --  105 100  CO2 22  --  22  --   --  25 28  GLUCOSE 149*  --  125*  --   --  105* 114*  BUN 14  --  17  --   --  15 18  CREATININE 1.13  --  1.57*  --   --  1.29* 1.24  CALCIUM 9.0  --  8.6*  --   --  8.3* 8.4*  AST  --   --  20  --   --  19  --   ALT  --   --  13  --   --  10  --   ALKPHOS  --   --  34*  --   --  28*  --   BILITOT  --   --  1.5*  --   --  1.9*  --   ALBUMIN  --   --  3.3*  --   --  3.1*  --   MG 1.2*  --  1.6*  --  2.2 1.9 1.9  INR 1.3*  --   --   --   --   --   --      ------------------------------------------------------------------------------------------------------------------ Recent Labs  10/06/22 0426  TRIG 158*    Lab Results  Component Value Date   HGBA1C 5.3 09/11/2016   ------------------------------------------------------------------------------------------------------------------ No results for input(s): "TSH", "T4TOTAL", "T3FREE", "THYROIDAB" in the last 72 hours.  Invalid input(s): "FREET3"  Cardiac Enzymes No results for input(s): "CKMB", "TROPONINI", "MYOGLOBIN" in the last 168 hours.  Invalid input(s): "CK" ------------------------------------------------------------------------------------------------------------------    Component Value Date/Time   BNP 138.4 (H) 10/30/2016 1420    CBG: Recent Labs  Lab 10/05/22 2007 10/05/22 2347 10/06/22 0338 10/06/22 0812 10/06/22 1553  GLUCAP 107* 98 105* 125* 107*    No results found for this or any previous visit (from the past 240 hour(s)).   Radiology Studies: No results found.   Pamella Pert, MD, PhD [facility]  Between 7 am - 7 pm I am available, please contact me via Amion (for emergencies) or Securechat (non urgent messages)  Between 7 pm - 7 am I am not available, please contact night coverage MD/APP via Amion

## 2022-10-08 NOTE — Progress Notes (Addendum)
Progress Note    10/08/2022 9:09 AM 3 Days Post-Op  Subjective:  feeling great. Passed flatus yesterday and had a nonbloody BM    Vitals:   10/08/22 0500 10/08/22 0600  BP: 112/67 114/76  Pulse: 64 65  Resp: 17 15  Temp:    SpO2: 98% 97%    Physical Exam: General:  sitting up comfortably in bed Cardiac:  regular Lungs:  nonlabored Incisions:  RP and left groin incisions intact and dry with staples Extremities:  2+ left DP pulse Abdomen:  soft, NT, ND  CBC    Component Value Date/Time   WBC 9.2 10/08/2022 0606   RBC 2.89 (L) 10/08/2022 0606   HGB 8.8 (L) 10/08/2022 0606   HGB 10.8 (L) 09/29/2022 1432   HGB 13.1 08/17/2018 1204   HCT 25.9 (L) 10/08/2022 0606   HCT 38.6 08/17/2018 1204   PLT 80 (L) 10/08/2022 0606   PLT 134 (L) 09/29/2022 1432   PLT 89 (LL) 08/17/2018 1204   MCV 89.6 10/08/2022 0606   MCV 92 08/17/2018 1204   MCH 30.4 10/08/2022 0606   MCHC 34.0 10/08/2022 0606   RDW 15.2 10/08/2022 0606   RDW 14.6 08/17/2018 1204   LYMPHSABS 1.1 09/29/2022 1432   LYMPHSABS 1.3 08/17/2018 1204   MONOABS 0.7 09/29/2022 1432   EOSABS 0.1 09/29/2022 1432   EOSABS 0.3 08/17/2018 1204   BASOSABS 0.0 09/29/2022 1432   BASOSABS 0.0 08/17/2018 1204    BMET    Component Value Date/Time   NA 138 10/08/2022 0606   NA 140 05/11/2019 0946   K 3.4 (L) 10/08/2022 0606   CL 100 10/08/2022 0606   CO2 28 10/08/2022 0606   GLUCOSE 114 (H) 10/08/2022 0606   BUN 18 10/08/2022 0606   BUN 17 05/11/2019 0946   CREATININE 1.24 10/08/2022 0606   CREATININE 1.49 (H) 09/29/2022 1432   CREATININE 0.75 04/10/2016 1132   CALCIUM 8.4 (L) 10/08/2022 0606   GFRNONAA >60 10/08/2022 0606   GFRNONAA 50 (L) 09/29/2022 1432   GFRAA 82 05/11/2019 0946   GFRAA >60 04/19/2018 1055    INR    Component Value Date/Time   INR 1.3 (H) 10/05/2022 1637     Intake/Output Summary (Last 24 hours) at 10/08/2022 0909 Last data filed at 10/08/2022 0600 Gross per 24 hour  Intake 149.57 ml   Output 1700 ml  Net -1550.43 ml      Assessment/Plan:  72 y.o. male is 3 days post op, s/p: left hypogastric and left external iliac artery stenting   Plus emergent takeback to OR for abdominal hematoma:   1.  Exploration left common femoral artery with primary repair of arteriotomy 2.  Selection of left common iliac artery and angiogram 3.  Stent of the left hypogastric artery with 7 x 59 mm VBX 4.  Open exploration of left common and external iliac arteries via retroperitoneal incision  -Remains hemodynamically stable with Hgb at 8.8 this morning. Will start subcutaneous heparin q8 hours for DVT prophylaxis -BP improved after resumption of home Atenolol yesterday -Scr greatly improved at 1.24. Good UO of 1700cc over 24 hours -Tolerated an Ensure yesterday without N/V/D. Okay to try a normal diet this morning. He also passed flatus and had a BM yesterday -RP and left groin incisions intact and dry with staples -LLE well perfused with 2+ DP pulse. No longer having stinging/numb sensation in his left leg -Continue to mobilize with PT/OT. Will transfer to 4E today   Loel Dubonnet, PA-C  [facility] [phone] 10/08/2022 9:09 AM  I have independently interviewed and examined patient and agree with PA assessment and plan above.   Mashawn Brazil C. Randie Heinz, MD [facility] [place] Office: [phone] Pager: [phone]

## 2022-10-08 NOTE — Progress Notes (Signed)
eLink Physician-Brief Progress Note Patient Name: Lonnie Hansen DOB: [birth year] MRN: [id number]   Date of Service  10/08/2022  HPI/Events of Note  CBC and Mg++ specimens still pending blood draw.  eICU Interventions  Bedside RN requested to contact lab to come and draw specimens stat.        [name] 10/08/2022, 3:58 AM

## 2022-10-09 ENCOUNTER — Other Ambulatory Visit (HOSPITAL_COMMUNITY): Payer: Self-pay

## 2022-10-09 DIAGNOSIS — S3991XD Unspecified injury of abdomen, subsequent encounter: Secondary | ICD-10-CM | POA: Diagnosis not present

## 2022-10-09 LAB — BPAM RBC
Blood Product Expiration Date: 202409172359
Blood Product Expiration Date: 202409172359
Blood Product Expiration Date: 202409182359
Blood Product Expiration Date: 202409252359
Blood Product Expiration Date: 202410012359
Blood Product Expiration Date: 202410012359
Blood Product Expiration Date: 202410022359
Blood Product Expiration Date: 202410022359
Blood Product Expiration Date: 202410022359
Blood Product Expiration Date: 202410022359
Blood Product Expiration Date: 202410032359
Blood Product Expiration Date: 202410032359
Blood Product Expiration Date: 202410032359
Blood Product Expiration Date: 202410032359
Blood Product Expiration Date: 202410032359
Blood Product Expiration Date: 202410032359
Blood Product Expiration Date: 202410042359
Blood Product Expiration Date: 202410042359
Blood Product Expiration Date: 202410042359
Blood Product Expiration Date: 202410072359
Blood Product Expiration Date: 202410072359
ISSUE DATE / TIME: 202409091222
ISSUE DATE / TIME: 202409091222
ISSUE DATE / TIME: 202409091237
ISSUE DATE / TIME: 202409091237
ISSUE DATE / TIME: 202409091237
ISSUE DATE / TIME: 202409091343
ISSUE DATE / TIME: 202409091343
ISSUE DATE / TIME: 202409091343
ISSUE DATE / TIME: 202409091343
ISSUE DATE / TIME: 202409091430
ISSUE DATE / TIME: 202409091430
ISSUE DATE / TIME: 202409091430
ISSUE DATE / TIME: 202409091430
ISSUE DATE / TIME: 202409091506
ISSUE DATE / TIME: 202409091506
ISSUE DATE / TIME: 202409091506
ISSUE DATE / TIME: 202409091506
Unit Type and Rh: 5100
Unit Type and Rh: 5100
Unit Type and Rh: 5100
Unit Type and Rh: 5100
Unit Type and Rh: 5100
Unit Type and Rh: 5100
Unit Type and Rh: 5100
Unit Type and Rh: 6200
Unit Type and Rh: 6200
Unit Type and Rh: 6200
Unit Type and Rh: 6200
Unit Type and Rh: 6200
Unit Type and Rh: 6200
Unit Type and Rh: 6200
Unit Type and Rh: 6200
Unit Type and Rh: 6200
Unit Type and Rh: 6200
Unit Type and Rh: 6200
Unit Type and Rh: 6200
Unit Type and Rh: 6200
Unit Type and Rh: 6200

## 2022-10-09 LAB — TYPE AND SCREEN
ABO/RH(D): AB POS
Antibody Screen: NEGATIVE
Donor AG Type: NEGATIVE
Donor AG Type: NEGATIVE
Donor AG Type: NEGATIVE
Donor AG Type: NEGATIVE
Donor AG Type: NEGATIVE
Donor AG Type: NEGATIVE
Donor AG Type: NEGATIVE
Donor AG Type: NEGATIVE
Donor AG Type: NEGATIVE
Donor AG Type: NEGATIVE
Donor AG Type: NEGATIVE
Donor AG Type: NEGATIVE
Donor AG Type: NEGATIVE
Donor AG Type: NEGATIVE
Donor AG Type: NEGATIVE
Donor AG Type: NEGATIVE
Donor AG Type: NEGATIVE
Unit division: 0
Unit division: 0
Unit division: 0
Unit division: 0
Unit division: 0
Unit division: 0
Unit division: 0
Unit division: 0
Unit division: 0
Unit division: 0
Unit division: 0
Unit division: 0
Unit division: 0
Unit division: 0
Unit division: 0
Unit division: 0
Unit division: 0
Unit division: 0
Unit division: 0
Unit division: 0
Unit division: 0

## 2022-10-09 LAB — CBC
HCT: 26.5 % — ABNORMAL LOW (ref 39.0–52.0)
Hemoglobin: 8.6 g/dL — ABNORMAL LOW (ref 13.0–17.0)
MCH: 29.5 pg (ref 26.0–34.0)
MCHC: 32.5 g/dL (ref 30.0–36.0)
MCV: 90.8 fL (ref 80.0–100.0)
Platelets: 94 10*3/uL — ABNORMAL LOW (ref 150–400)
RBC: 2.92 MIL/uL — ABNORMAL LOW (ref 4.22–5.81)
RDW: 14.9 % (ref 11.5–15.5)
WBC: 8 10*3/uL (ref 4.0–10.5)
nRBC: 0 % (ref 0.0–0.2)

## 2022-10-09 LAB — BASIC METABOLIC PANEL
Anion gap: 7 (ref 5–15)
BUN: 20 mg/dL (ref 8–23)
CO2: 28 mmol/L (ref 22–32)
Calcium: 8.7 mg/dL — ABNORMAL LOW (ref 8.9–10.3)
Chloride: 103 mmol/L (ref 98–111)
Creatinine, Ser: 1.11 mg/dL (ref 0.61–1.24)
GFR, Estimated: >60 mL/min (ref >60)
Glucose, Bld: 117 mg/dL — ABNORMAL HIGH (ref 70–99)
Potassium: 3.8 mmol/L (ref 3.5–5.1)
Sodium: 138 mmol/L (ref 135–145)

## 2022-10-09 LAB — MAGNESIUM: Magnesium: 1.7 mg/dL (ref 1.7–2.4)

## 2022-10-09 MED ORDER — TRAMADOL HCL 50 MG PO TABS
50.0000 mg | ORAL_TABLET | Freq: Four times a day (QID) | ORAL | 0 refills | Status: DC | PRN
  Filled 2022-10-09: qty 20, 5d supply, fill #0

## 2022-10-09 MED ORDER — HYDROCORTISONE 1 % EX CREA
TOPICAL_CREAM | Freq: Two times a day (BID) | CUTANEOUS | Status: DC
  Filled 2022-10-09: qty 28

## 2022-10-09 MED ORDER — HYDROCORTISONE 1 % EX CREA: TOPICAL_CREAM | Freq: Two times a day (BID) | CUTANEOUS | 0 refills | Status: DC

## 2022-10-09 NOTE — Progress Notes (Addendum)
  Progress Note    10/09/2022 7:45 AM 4 Days Post-Op  Subjective:  no complaints   Vitals:   10/08/22 2348 10/09/22 0335  BP: 128/68 103/67  Pulse: 65 63  Resp: 14 16  Temp: 97.6 F (36.4 C) 98.1 F (36.7 C)  SpO2: 100% 100%   Physical Exam: Lungs:  non labored Incisions:  L groin c/d/i Extremities:  palpable DP pulses Neurologic: A&O  CBC    Component Value Date/Time   WBC 9.2 10/08/2022 0606   RBC 2.89 (L) 10/08/2022 0606   HGB 8.8 (L) 10/08/2022 0606   HGB 10.8 (L) 09/29/2022 1432   HGB 13.1 08/17/2018 1204   HCT 25.9 (L) 10/08/2022 0606   HCT 38.6 08/17/2018 1204   PLT 80 (L) 10/08/2022 0606   PLT 134 (L) 09/29/2022 1432   PLT 89 (LL) 08/17/2018 1204   MCV 89.6 10/08/2022 0606   MCV 92 08/17/2018 1204   MCH 30.4 10/08/2022 0606   MCHC 34.0 10/08/2022 0606   RDW 15.2 10/08/2022 0606   RDW 14.6 08/17/2018 1204   LYMPHSABS 1.1 09/29/2022 1432   LYMPHSABS 1.3 08/17/2018 1204   MONOABS 0.7 09/29/2022 1432   EOSABS 0.1 09/29/2022 1432   EOSABS 0.3 08/17/2018 1204   BASOSABS 0.0 09/29/2022 1432   BASOSABS 0.0 08/17/2018 1204    BMET    Component Value Date/Time   NA 138 10/08/2022 0606   NA 140 05/11/2019 0946   K 3.4 (L) 10/08/2022 0606   CL 100 10/08/2022 0606   CO2 28 10/08/2022 0606   GLUCOSE 114 (H) 10/08/2022 0606   BUN 18 10/08/2022 0606   BUN 17 05/11/2019 0946   CREATININE 1.24 10/08/2022 0606   CREATININE 1.49 (H) 09/29/2022 1432   CREATININE 0.75 04/10/2016 1132   CALCIUM 8.4 (L) 10/08/2022 0606   GFRNONAA >60 10/08/2022 0606   GFRNONAA 50 (L) 09/29/2022 1432   GFRAA 82 05/11/2019 0946   GFRAA >60 04/19/2018 1055    INR    Component Value Date/Time   INR 1.3 (H) 10/05/2022 1637     Intake/Output Summary (Last 24 hours) at 10/09/2022 0745 Last data filed at 10/08/2022 2350 Gross per 24 hour  Intake 480 ml  Output 850 ml  Net -370 ml     Assessment/Plan:  72 y.o. male is s/p s/p: left hypogastric and left external iliac  artery stenting   Plus emergent takeback to OR for abdominal hematoma:   1.  Exploration left common femoral artery with primary repair of arteriotomy 2.  Selection of left common iliac artery and angiogram 3.  Stent of the left hypogastric artery with 7 x 59 mm VBX 4.  Open exploration of left common and external iliac arteries via retroperitoneal incision 4 Days Post-Op    BLE well perfused with palpable DP pulses TOC consulted for [facility] PT Ok for discharge when [facility] arranged and if cleared medically   Emilie Rutter, PA-C [facility] [phone] 10/09/2022 7:45 AM   I have independently interviewed and examined patient and agree with PA assessment and plan above.   Marlicia Sroka C. Randie Heinz, MD [facility] [place] Office: [phone] Pager: [phone]

## 2022-10-09 NOTE — Discharge Instructions (Signed)
[facility]  Discharge Instructions  Lower Extremity Angiogram; Angioplasty/Stenting  Please refer to the following instructions for your post-procedure care. Your surgeon or physician assistant will discuss any changes with you.  Activity  Avoid lifting more than 8 pounds (1 gallons of milk) for 72 hours (3 days) after your procedure. You may walk as much as you can tolerate. It's OK to drive after 72 hours.  Bathing/Showering  You may shower the day after your procedure. If you have a bandage, you may remove it at 24- 48 hours. Clean your incision site with mild soap and water. Pat the area dry with a clean towel.  Diet  Resume your pre-procedure diet. There are no special food restrictions following this procedure. All patients with peripheral vascular disease should follow a low fat/low cholesterol diet. In order to heal from your surgery, it is CRITICAL to get adequate nutrition. Your body requires vitamins, minerals, and protein. Vegetables are the best source of vitamins and minerals. Vegetables also provide the perfect balance of protein. Processed food has little nutritional value, so try to avoid this.  Medications  Resume taking all of your medications unless your doctor tells you not to. If your incision is causing pain, you may take over-the-counter pain relievers such as acetaminophen (Tylenol)  Follow Up  Follow up will be arranged at the time of your procedure. You may have an office visit scheduled or may be scheduled for surgery. Ask your surgeon if you have any questions.  Please call us immediately for any of the following conditions: Severe or worsening pain your legs or feet at rest or with walking. Increased pain, redness, drainage at your groin puncture site. Fever of 101 degrees or higher. If you have any mild or slow bleeding from your puncture site: lie down, apply firm constant pressure over the area with a piece of  gauze or a clean wash cloth for 30 minutes- no peeking!, call 911 right away if you are still bleeding after 30 minutes, or if the bleeding is heavy and unmanageable.  Reduce your risk factors of vascular disease:  Stop smoking. If you would like help call [facility] at 1-800-QUIT-NOW [phone]) 165-3592) or [place] at [phone]. Manage your cholesterol Maintain a desired weight Control your diabetes Keep your blood pressure down  If you have any questions, please call the office at [phone]

## 2022-10-09 NOTE — Progress Notes (Signed)
Mobility Specialist Progress Note:   10/09/22 1056  Mobility  Activity Ambulated with assistance in hallway  Level of Assistance Contact guard assist, steadying assist  Assistive Device Front wheel walker  Distance Ambulated (ft) 475 ft  Activity Response Tolerated well  Mobility Referral Yes  $Mobility charge 1 Mobility  Mobility Specialist Start Time (ACUTE ONLY) 1008  Mobility Specialist Stop Time (ACUTE ONLY) 1020  Mobility Specialist Time Calculation (min) (ACUTE ONLY) 12 min   Pre Mobility: 74 HR  During Mobility: 89 HR Post Mobility: 63 HR   Pt received in bed, agreeable to mobility. SB to stand. CG during ambulation. Pt c/o LUE soreness during ambulation, otherwise asymptomatic throughout. Pt returned to bed with call bell in hand and all needs met.  Leory Plowman  Mobility Specialist Please contact via [employer] office at [phone]

## 2022-10-09 NOTE — TOC Transition Note (Signed)
Transition of Care (TOC) - CM/SW Discharge Note Donn Pierini RN, BSN Transitions of Care Unit 4E- RN Case Manager See Treatment Team for direct phone #   Patient Details  Name: Lonnie Hansen MRN: [id number] Date of Birth: Sep 24, 1950  Transition of Care [place]) CM/SW Contact:  Darrold Span, RN Phone Number: 10/09/2022, 12:47 PM   Clinical Narrative:    Pt stable for transition home today, Notified by [facility] liaison -following patient with MD office protocol referral prearranged for [facility] needs Order placed for HHPT  CM spoke with pt at bedside- discussed [facility] referral from [facility] office- per pt he voiced he usually goes to the [facility]- however is agreeable to [facility] on discharge as well as using [facility].   Pt states he has both cane and RW at home- no other DME needs noted at this time.   Pt voiced wife to transport home.   [facility] liaison notified for start of care   Final next level of care: Home w Home Health Services Barriers to Discharge: Barriers Resolved   Patient Goals and CMS Choice CMS Medicare.gov Compare Post Acute Care list provided to:: Patient Choice offered to / list presented to : Patient  Discharge Placement                 Home w/ [facility]        Discharge Plan and Services Additional resources added to the After Visit Summary for   In-house Referral: NA Discharge Planning Services: CM Consult Post Acute Care Choice: Home Health          DME Arranged: N/A DME Agency: NA       HH Arranged: PT HH Agency: [facility] ([facility]) Date HH Agency Contacted: 10/09/22 Time HH Agency Contacted: 1247 Representative spoke with at [facility] Agency: Nilsa Nutting  Social Determinants of Health (SDOH) Interventions SDOH Screenings   Food Insecurity: No Food Insecurity (11/28/2021)  Housing: Low Risk  (11/28/2021)  Transportation Needs: No Transportation Needs (11/28/2021)  Utilities: Not At Risk (11/28/2021)  Depression (PHQ2-9): Low Risk  (05/30/2020)   Tobacco Use: Medium Risk (09/14/2022)     Readmission Risk Interventions    10/09/2022   12:47 PM 08/19/2021   11:21 AM  Readmission Risk Prevention Plan  Post Dischage Appt Complete Complete  Medication Screening Complete Complete  Transportation Screening Complete Complete

## 2022-10-09 NOTE — Care Management Important Message (Signed)
Important Message  Patient Details  Name: AIRIK GUILBERT MRN: [id number] Date of Birth: [birth year]   Medicare Important Message Given:  Yes     Sherilyn Banker 10/09/2022, 1:22 PM

## 2022-10-09 NOTE — Progress Notes (Addendum)
PROGRESS NOTE  Lonnie Hansen MWU:[id number] DOB: [birth year] DOA: 10/05/2022 PCP: [name], MD   LOS: 4 days   Brief Narrative / Interim history: 72 year old male with history of CAD, chronic ITP, admitted electively for left hypogastric artery aneurysm stenting, s/p operative repair on 9/9 by Dr. Randie Heinz.  Postoperative course complicated by hypotension acute blood loss anemia, and he was taken back to the OR on 9/9 and a bleeding area was identified on the pubic tubercle / retroperitoneal area, status post cautery/clipping.  He was observed in the ICU during this time, and upon moving to progressive hospitalist team was consulted to assist with medical management  Subjective / 24h Interval events: Complains of itching couple spots on his back and left leg.  No other complaints  Assesement and Plan: Principal Problem:   Groin injury Active Problems:   Intra-abdominal bleeding   Principal problem Hemorrhagic shock -required pressors while in the ICU, shock physiology has resolved.  Managed by vascular surgery as above -Received several units of blood, cryoprecipitate, FFP as well as platelets.  Counts currently stable, hemoglobin in the 11 range.  Blood work this morning pending  Active problems Left hypogastric artery aneurysm-status post stenting.  Defer aspirin/Plavix to vascular  Rash-localized to his left lower back and left thigh, query local allergic reaction but not sure cause.  Symptomatic steroid cream  CKD 3A-baseline creatinine 1.3-1.5, blood this morning pending  ITP-platelets overall stable  Essential hypertension-atenolol was reintroduced at half of the home dose, blood pressure seems to be stable today in the 1 teens-120s.  At home he is also taking amlodipine and losartan/HCTZ, continue to hold for now  Hyperlipidemia-continue statin  Disposition - per primary team From medicine standpoint, assuming blood work this morning does not show significant drop in  hemoglobin or platelets, he can be discharged today from medical standpoint.  I would hold his home amlodipine and Hyzaar, and keep on atenolol alone for now. Also, would prescribe a steroid cream upon discharge for his rash  Recommend PCP follow-up within a week for blood pressure recheck and to potentially have his rest of antihypertensives restarted if needed  Scheduled Meds:  atenolol  12.5 mg Oral BID   Chlorhexidine Gluconate Cloth  6 each Topical Daily   docusate sodium  100 mg Oral Daily   heparin injection (subcutaneous)  5,000 Units Subcutaneous Q8H   hydrocortisone cream   Topical BID   latanoprost  1 drop Both Eyes BID   pantoprazole  40 mg Oral Daily   rosuvastatin  20 mg Oral Daily   Continuous Infusions:  sodium chloride     PRN Meds:.sodium chloride, acetaminophen **OR** acetaminophen, alum & mag hydroxide-simeth, guaiFENesin-dextromethorphan, hydrALAZINE, labetalol, metoprolol tartrate, ondansetron, mouth rinse, phenol, traMADol  Current Outpatient Medications  Medication Instructions   amLODipine (NORVASC) 10 mg, Oral, Daily   atenolol (TENORMIN) 12.5 mg, Oral, 2 times daily   clopidogrel (PLAVIX) 75 mg, Oral, Daily   latanoprost (XALATAN) 0.005 % ophthalmic solution 1 drop, Both Eyes, 2 times daily   losartan-hydrochlorothiazide (HYZAAR) 100-12.5 MG tablet 1 tablet, Oral, Daily   Multiple Vitamin (MULTIVITAMIN WITH MINERALS) TABS tablet 1 tablet, Oral, Daily   pantoprazole (PROTONIX) 40 mg, Oral, 2 times daily   rosuvastatin (CRESTOR) 20 mg, Oral, Daily    Diet Orders (From admission, onward)     Start     Ordered   10/07/22 1325  Diet Heart Room service appropriate? Yes; Fluid consistency: Thin  Diet effective now  Question Answer Comment  Room service appropriate? Yes   Fluid consistency: Thin      10/07/22 1324            DVT prophylaxis: heparin injection 5,000 Units Start: 10/08/22 1400 SCD's Start: 10/05/22 1627   Lab Results   Component Value Date   PLT 80 (L) 10/08/2022      Code Status: Full Code  Family Communication: no family at bedside   Level of care: Progressive   Objective: Vitals:   10/08/22 2040 10/08/22 2348 10/09/22 0335 10/09/22 0858  BP: 109/75 128/68 103/67 105/72  Pulse: 66 65 63   Resp: 12 14 16 18   Temp: 98.3 F (36.8 C) 97.6 F (36.4 C) 98.1 F (36.7 C) 97.9 F (36.6 C)  TempSrc: Oral Oral Oral Oral  SpO2: 100% 100% 100% 100%  Weight:      Height:        Intake/Output Summary (Last 24 hours) at 10/09/2022 0940 Last data filed at 10/08/2022 2350 Gross per 24 hour  Intake 480 ml  Output 850 ml  Net -370 ml   Wt Readings from Last 3 Encounters:  10/05/22 82.6 kg  09/14/22 85.3 kg  09/09/22 85.6 kg    Examination:  Constitutional: NAD Eyes: lids and conjunctivae normal, no scleral icterus ENMT: mmm Neck: normal, supple Respiratory: clear to auscultation bilaterally, no wheezing, no crackles. Normal respiratory effort.  Cardiovascular: Regular rate and rhythm, no murmurs / rubs / gallops. No LE edema. Abdomen: soft, no distention, no tenderness. Bowel sounds positive.  Skin: no rashes  Data Reviewed: I have independently reviewed following labs and imaging studies   CBC Recent Labs  Lab 10/05/22 1637 10/05/22 1659 10/06/22 0426 10/06/22 0432 10/06/22 1700 10/07/22 0309 10/07/22 1102 10/08/22 0606  WBC 9.1  --  9.8  --  9.6 8.8  --  9.2  HGB 11.6*   < > 10.7* 10.2* 9.8* 8.8* 9.2* 8.8*  HCT 32.6*   < > 30.8* 30.0* 29.0* 26.3* 28.4* 25.9*  PLT 62*  --  70*  --  71* 66*  --  80*  MCV 87.6  --  84.6  --  86.6 88.3  --  89.6  MCH 31.2  --  29.4  --  29.3 29.5  --  30.4  MCHC 35.6  --  34.7  --  33.8 33.5  --  34.0  RDW 15.3  --  16.1*  --  16.5* 16.2*  --  15.2   < > = values in this interval not displayed.    Recent Labs  Lab 10/05/22 1637 10/05/22 1659 10/06/22 0426 10/06/22 0432 10/06/22 1700 10/07/22 0309 10/08/22 0606  NA 136 140 139 141  --   140 138  K 3.3* 3.5 3.7 3.9  --  3.5 3.4*  CL 104  --  107  --   --  105 100  CO2 22  --  22  --   --  25 28  GLUCOSE 149*  --  125*  --   --  105* 114*  BUN 14  --  17  --   --  15 18  CREATININE 1.13  --  1.57*  --   --  1.29* 1.24  CALCIUM 9.0  --  8.6*  --   --  8.3* 8.4*  AST  --   --  20  --   --  19  --   ALT  --   --  13  --   --  10  --   ALKPHOS  --   --  34*  --   --  28*  --   BILITOT  --   --  1.5*  --   --  1.9*  --   ALBUMIN  --   --  3.3*  --   --  3.1*  --   MG 1.2*  --  1.6*  --  2.2 1.9 1.9  INR 1.3*  --   --   --   --   --   --     ------------------------------------------------------------------------------------------------------------------ No results for input(s): "CHOL", "HDL", "LDLCALC", "TRIG", "CHOLHDL", "LDLDIRECT" in the last 72 hours.   Lab Results  Component Value Date   HGBA1C 5.3 09/11/2016   ------------------------------------------------------------------------------------------------------------------ No results for input(s): "TSH", "T4TOTAL", "T3FREE", "THYROIDAB" in the last 72 hours.  Invalid input(s): "FREET3"  Cardiac Enzymes No results for input(s): "CKMB", "TROPONINI", "MYOGLOBIN" in the last 168 hours.  Invalid input(s): "CK" ------------------------------------------------------------------------------------------------------------------    Component Value Date/Time   BNP 138.4 (H) 10/30/2016 1420    CBG: Recent Labs  Lab 10/05/22 2007 10/05/22 2347 10/06/22 0338 10/06/22 0812 10/06/22 1553  GLUCAP 107* 98 105* 125* 107*    No results found for this or any previous visit (from the past 240 hour(s)).   Radiology Studies: No results found.   Pamella Pert, MD, PhD [facility]  Between 7 am - 7 pm I am available, please contact me via Amion (for emergencies) or Securechat (non urgent messages)  Between 7 pm - 7 am I am not available, please contact night coverage MD/APP via Amion

## 2022-10-09 NOTE — Progress Notes (Signed)
TOC med given to patient

## 2022-10-11 DIAGNOSIS — I129 Hypertensive chronic kidney disease with stage 1 through stage 4 chronic kidney disease, or unspecified chronic kidney disease: Secondary | ICD-10-CM | POA: Diagnosis not present

## 2022-10-11 DIAGNOSIS — I9581 Postprocedural hypotension: Secondary | ICD-10-CM | POA: Diagnosis not present

## 2022-10-11 DIAGNOSIS — N1831 Chronic kidney disease, stage 3a: Secondary | ICD-10-CM | POA: Diagnosis not present

## 2022-10-11 DIAGNOSIS — K922 Gastrointestinal hemorrhage, unspecified: Secondary | ICD-10-CM | POA: Diagnosis not present

## 2022-10-11 DIAGNOSIS — I2510 Atherosclerotic heart disease of native coronary artery without angina pectoris: Secondary | ICD-10-CM | POA: Diagnosis not present

## 2022-10-11 DIAGNOSIS — E785 Hyperlipidemia, unspecified: Secondary | ICD-10-CM | POA: Diagnosis not present

## 2022-10-11 DIAGNOSIS — D62 Acute posthemorrhagic anemia: Secondary | ICD-10-CM | POA: Diagnosis not present

## 2022-10-11 DIAGNOSIS — R21 Rash and other nonspecific skin eruption: Secondary | ICD-10-CM | POA: Diagnosis not present

## 2022-10-11 DIAGNOSIS — Z7982 Long term (current) use of aspirin: Secondary | ICD-10-CM | POA: Diagnosis not present

## 2022-10-11 DIAGNOSIS — Z48812 Encounter for surgical aftercare following surgery on the circulatory system: Secondary | ICD-10-CM | POA: Diagnosis not present

## 2022-10-11 DIAGNOSIS — D693 Immune thrombocytopenic purpura: Secondary | ICD-10-CM | POA: Diagnosis not present

## 2022-10-12 ENCOUNTER — Other Ambulatory Visit: Payer: Self-pay | Admitting: *Deleted

## 2022-10-12 ENCOUNTER — Encounter: Payer: Self-pay | Admitting: Medical Oncology

## 2022-10-12 ENCOUNTER — Inpatient Hospital Stay: Payer: Medicare Other

## 2022-10-12 ENCOUNTER — Inpatient Hospital Stay (HOSPITAL_BASED_OUTPATIENT_CLINIC_OR_DEPARTMENT_OTHER): Payer: Medicare Other | Admitting: Medical Oncology

## 2022-10-12 ENCOUNTER — Other Ambulatory Visit: Payer: Self-pay

## 2022-10-12 VITALS — BP 144/75 | HR 69 | Temp 98.3°F | Resp 19 | Ht 69.0 in | Wt 187.8 lb

## 2022-10-12 DIAGNOSIS — Z9289 Personal history of other medical treatment: Secondary | ICD-10-CM

## 2022-10-12 DIAGNOSIS — D693 Immune thrombocytopenic purpura: Secondary | ICD-10-CM | POA: Diagnosis not present

## 2022-10-12 DIAGNOSIS — D509 Iron deficiency anemia, unspecified: Secondary | ICD-10-CM

## 2022-10-12 DIAGNOSIS — Z7902 Long term (current) use of antithrombotics/antiplatelets: Secondary | ICD-10-CM | POA: Diagnosis not present

## 2022-10-12 DIAGNOSIS — Z79899 Other long term (current) drug therapy: Secondary | ICD-10-CM | POA: Diagnosis not present

## 2022-10-12 LAB — SAMPLE TO BLOOD BANK

## 2022-10-12 LAB — CMP (CANCER CENTER ONLY)
ALT: 41 U/L (ref 0–44)
AST: 52 U/L — ABNORMAL HIGH (ref 15–41)
Albumin: 4.1 g/dL (ref 3.5–5.0)
Alkaline Phosphatase: 59 U/L (ref 38–126)
Anion gap: 9 (ref 5–15)
BUN: 22 mg/dL (ref 8–23)
CO2: 29 mmol/L (ref 22–32)
Calcium: 9.5 mg/dL (ref 8.9–10.3)
Chloride: 102 mmol/L (ref 98–111)
Creatinine: 1.2 mg/dL (ref 0.61–1.24)
GFR, Estimated: >60 mL/min (ref >60)
Glucose, Bld: 115 mg/dL — ABNORMAL HIGH (ref 70–99)
Potassium: 4.7 mmol/L (ref 3.5–5.1)
Sodium: 140 mmol/L (ref 135–145)
Total Bilirubin: 1.6 mg/dL — ABNORMAL HIGH (ref 0.3–1.2)
Total Protein: 7.1 g/dL (ref 6.5–8.1)

## 2022-10-12 LAB — CBC WITH DIFFERENTIAL (CANCER CENTER ONLY)
Abs Immature Granulocytes: 0.25 10*3/uL — ABNORMAL HIGH (ref 0.00–0.07)
Basophils Absolute: 0 10*3/uL (ref 0.0–0.1)
Basophils Relative: 0 %
Eosinophils Absolute: 0.2 10*3/uL (ref 0.0–0.5)
Eosinophils Relative: 3 %
HCT: 29.5 % — ABNORMAL LOW (ref 39.0–52.0)
Hemoglobin: 9.3 g/dL — ABNORMAL LOW (ref 13.0–17.0)
Immature Granulocytes: 3 %
Lymphocytes Relative: 8 %
Lymphs Abs: 0.7 10*3/uL (ref 0.7–4.0)
MCH: 29.4 pg (ref 26.0–34.0)
MCHC: 31.5 g/dL (ref 30.0–36.0)
MCV: 93.4 fL (ref 80.0–100.0)
Monocytes Absolute: 1.2 10*3/uL — ABNORMAL HIGH (ref 0.1–1.0)
Monocytes Relative: 13 %
Neutro Abs: 6.6 10*3/uL (ref 1.7–7.7)
Neutrophils Relative %: 73 %
Platelet Count: 106 10*3/uL — ABNORMAL LOW (ref 150–400)
RBC: 3.16 MIL/uL — ABNORMAL LOW (ref 4.22–5.81)
RDW: 14.8 % (ref 11.5–15.5)
WBC Count: 9 10*3/uL (ref 4.0–10.5)
nRBC: 0 % (ref 0.0–0.2)

## 2022-10-12 LAB — RETICULOCYTES
Immature Retic Fract: 26.2 % — ABNORMAL HIGH (ref 2.3–15.9)
RBC.: 3.14 MIL/uL — ABNORMAL LOW (ref 4.22–5.81)
Retic Count, Absolute: 132.2 10*3/uL (ref 19.0–186.0)
Retic Ct Pct: 4.2 % — ABNORMAL HIGH (ref 0.4–3.1)

## 2022-10-12 LAB — SAVE SMEAR(SSMR), FOR PROVIDER SLIDE REVIEW

## 2022-10-12 LAB — FERRITIN: Ferritin: 221 ng/mL (ref 24–336)

## 2022-10-12 MED ORDER — ROMIPLOSTIM 125 MCG ~~LOC~~ SOLR
95.0000 ug | Freq: Once | SUBCUTANEOUS | Status: AC
  Administered 2022-10-12: 95 ug via SUBCUTANEOUS
  Filled 2022-10-12: qty 0.19

## 2022-10-12 NOTE — Patient Instructions (Signed)
Romiplostim Injection What is this medication? ROMIPLOSTIM (roe mi PLOE stim) treats low levels of platelets in your body caused by immune thrombocytopenia (ITP). It is prescribed when other medications have not worked or cannot be tolerated. It may also be used to help people who have been exposed to high doses of radiation. It works by increasing the amount of platelets in your blood. This lowers the risk of bleeding. This medicine may be used for other purposes; ask your health care provider or pharmacist if you have questions. COMMON BRAND NAME(S): Nplate What should I tell my care team before I take this medication? They need to know if you have any of these conditions: Blood clots Myelodysplastic syndrome An unusual or allergic reaction to romiplostim, mannitol, other medications, foods, dyes, or preservatives Pregnant or trying to get pregnant Breast-feeding How should I use this medication? This medication is injected under the skin. It is given by a care team in a hospital or clinic setting. A special MedGuide will be given to you before each treatment. Be sure to read this information carefully each time. Talk to your care team about the use of this medication in children. While it may be prescribed for children as young as newborns for selected conditions, precautions do apply. Overdosage: If you think you have taken too much of this medicine contact a poison control center or emergency room at once. NOTE: This medicine is only for you. Do not share this medicine with others. What if I miss a dose? Keep appointments for follow-up doses. It is important not to miss your dose. Call your care team if you are unable to keep an appointment. What may interact with this medication? Interactions are not expected. This list may not describe all possible interactions. Give your health care provider a list of all the medicines, herbs, non-prescription drugs, or dietary supplements you use. Also  tell them if you smoke, drink alcohol, or use illegal drugs. Some items may interact with your medicine. What should I watch for while using this medication? Visit your care team for regular checks on your progress. You may need blood work done while you are taking this medication. Your condition will be monitored carefully while you are receiving this medication. It is important not to miss any appointments. What side effects may I notice from receiving this medication? Side effects that you should report to your care team as soon as possible: Allergic reactions--skin rash, itching, hives, swelling of the face, lips, tongue, or throat Blood clot--pain, swelling, or warmth in the leg, shortness of breath, chest pain Side effects that usually do not require medical attention (report to your care team if they continue or are bothersome): Dizziness Joint pain Muscle pain Pain in the hands or feet Stomach pain Trouble sleeping This list may not describe all possible side effects. Call your doctor for medical advice about side effects. You may report side effects to FDA at 1-800-FDA-1088. Where should I keep my medication? This medication is given in a hospital or clinic. It will not be stored at home. NOTE: This sheet is a summary. It may not cover all possible information. If you have questions about this medicine, talk to your doctor, pharmacist, or health care provider.  2024 Elsevier/Gold Standard (2021-05-19 00:00:00)

## 2022-10-12 NOTE — Discharge Summary (Signed)
Discharge Summary  Patient ID: Lonnie Hansen [id number] 71 y.o. Mar 02, 1950  Admit date: 10/05/2022  Discharge date and time: 10/09/2022  3:01 PM   Admitting Physician: Maeola Harman, MD   Discharge Physician: same  Admission Diagnoses: Groin injury [S39.91XA] Intra-abdominal bleeding [R58]  Discharge Diagnoses: same  Admission Condition: fair  Discharged Condition: fair  Indication for Admission: post op care  Hospital Course: [name] is a 72 year old male who came in as an outpatient and underwent left external iliac artery stenting as well as stenting of the left hypogastric artery.  Postoperatively he was hypotensive with severe abdominal pain and expanding hematoma.  He was brought to the operating room and underwent exploration of the left common femoral artery as well as exploration of the iliac artery via retroperitoneal incision with stenting of the left hypogastric artery by Dr. Randie Heinz on 10/05/2022.  Postoperatively he was admitted to the ICU and kept intubated overnight.  Critical care was consulted for help with vent management and further resuscitation.  Postoperative day #1 hemorrhagic shock resolved and he was extubated.  After 2 days in the ICU he was transferred to the stepdown unit.  Throughout his hospital stay he maintained well-perfused bilateral lower extremities with palpable DP pulses.  He will follow-up in office in 2 weeks for staple removal.  He was discharged home in stable condition.  Consults: None  Treatments: surgery: Aortogram with stenting of the left external iliac artery as well as left hypogastric artery with Viabahn on 10/05/2022  Exploration of the left common femoral artery with primary repair of arteriotomy and stenting of the left hypogastric artery and open exploration via retroperitoneal incision by Dr. Randie Heinz on 10/05/2022  Discharge Exam: See progress note 10/09/22 Vitals:   10/09/22 0900 10/09/22 1211  BP: 105/72 113/68   Pulse: 63 62  Resp: 19 20  Temp:  98.4 F (36.9 C)  SpO2: 98% 95%     Disposition: Discharge disposition: 01-Home or Self Care       Patient Instructions:  Allergies as of 10/09/2022   No Known Allergies      Medication List     STOP taking these medications    amLODipine 10 MG tablet Commonly known as: NORVASC   losartan-hydrochlorothiazide 100-12.5 MG tablet Commonly known as: HYZAAR       TAKE these medications    atenolol 25 MG tablet Commonly known as: TENORMIN Take 12.5 mg by mouth 2 (two) times daily.   clopidogrel 75 MG tablet Commonly known as: Plavix Take 1 tablet (75 mg total) by mouth daily.   hydrocortisone cream 1 % Apply topically 2 (two) times daily.   latanoprost 0.005 % ophthalmic solution Commonly known as: XALATAN Place 1 drop into both eyes 2 (two) times daily.   multivitamin with minerals Tabs tablet Take 1 tablet by mouth daily.   pantoprazole 40 MG tablet Commonly known as: PROTONIX Take 40 mg by mouth 2 (two) times daily.   rosuvastatin 20 MG tablet Commonly known as: CRESTOR Take 20 mg by mouth daily.   traMADol 50 MG tablet Commonly known as: ULTRAM Take 1 tablet (50 mg total) by mouth every 6 (six) hours as needed for severe pain.       Activity: activity as tolerated Diet: regular diet Wound Care: keep wound clean and dry  Follow-up with [facility] in 2 weeks.  Signed: Emilie Rutter, PA-C 10/12/2022 8:31 AM [facility] Office: [phone]

## 2022-10-12 NOTE — Progress Notes (Signed)
Hematology and Oncology Follow Up Visit  Lonnie Hansen [id number] [birth year] 72 y.o. 10/12/2022   Principle Diagnosis:  Chronic ITP   Current Therapy:        Nplate to keep platelet count over 100,000   Interim History:  Lonnie Hansen is here today for follow-up.  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 Randie Heinz with Vascular Surgery. Unfortunately after this surgery he went into hemorrhagic shock. Required left hypogastric artery stenting with Viabahn on 10/05/2022. He was admitted to the ICU and recovered. He was discharged on 10/09/2022. He has follow up within 2 weeks. He reports that he is feeling better every day.   He is here for follow up and consideration of Nplate. There is no bleeding.  Has some bruising from the hospital. He has had no rashes.  He has had no change in bowel or bladder habits.  He has had no leg weakness.  There is been no leg pain.    Overall, I would say that his performance status is probably ECOG 1.   Wt Readings from Last 3 Encounters:  10/12/22 187 lb 12.8 oz (85.2 kg)  10/05/22 182 lb (82.6 kg)  09/14/22 188 lb 1.3 oz (85.3 kg)    Medications:  Allergies as of 10/12/2022   No Known Allergies      Medication List        Accurate as of October 12, 2022  3:39 PM. If you have any questions, ask your nurse or doctor.          atenolol 25 MG tablet Commonly known as: TENORMIN Take 12.5 mg by mouth 2 (two) times daily.   clopidogrel 75 MG tablet Commonly known as: Plavix Take 1 tablet (75 mg total) by mouth daily.   hydrocortisone cream 1 % Apply topically 2 (two) times daily.   latanoprost 0.005 % ophthalmic solution Commonly known as: XALATAN Place 1 drop into both eyes 2 (two) times daily.   multivitamin with minerals Tabs tablet Take 1 tablet by mouth daily.   pantoprazole 40 MG tablet Commonly known as: PROTONIX Take 40 mg by mouth 2 (two) times daily.   rosuvastatin 20 MG tablet Commonly known as:  CRESTOR Take 20 mg by mouth daily.   traMADol 50 MG tablet Commonly known as: ULTRAM Take 1 tablet (50 mg total) by mouth every 6 (six) hours as needed for severe pain.        Allergies: No Known Allergies  Past Medical History, Surgical history, Social history, and Family History were reviewed and updated.  Review of Systems: All other 10 point review of systems is negative.   Physical Exam:  height is 5\' 9"  (1.753 m) and weight is 187 lb 12.8 oz (85.2 kg). His oral temperature is 98.3 F (36.8 C). His blood pressure is 144/75 (abnormal) and his pulse is 69. His respiration is 19 and oxygen saturation is 100%.   Wt Readings from Last 3 Encounters:  10/12/22 187 lb 12.8 oz (85.2 kg)  10/05/22 182 lb (82.6 kg)  09/14/22 188 lb 1.3 oz (85.3 kg)    Physical Exam Vitals reviewed.  HENT:     Head: Normocephalic and atraumatic.  Eyes:     Pupils: Pupils are equal, round, and reactive to light.  Cardiovascular:     Rate and Rhythm: Normal rate and regular rhythm.     Heart sounds: Normal heart sounds.  Pulmonary:     Effort: Pulmonary effort is normal.  Breath sounds: Normal breath sounds.  Abdominal:     General: Bowel sounds are normal.     Palpations: Abdomen is soft.  Musculoskeletal:        General: No tenderness or deformity. Normal range of motion.     Cervical back: Normal range of motion.  Lymphadenopathy:     Cervical: No cervical adenopathy.  Skin:    General: Skin is warm and dry.     Findings: No erythema or rash.  Neurological:     Mental Status: He is alert and oriented to person, place, and time.  Psychiatric:        Behavior: Behavior normal.        Thought Content: Thought content normal.        Judgment: Judgment normal.      Lab Results  Component Value Date   WBC 9.0 10/12/2022   HGB 9.3 (L) 10/12/2022   HCT 29.5 (L) 10/12/2022   MCV 93.4 10/12/2022   PLT 106 (L) 10/12/2022   Lab Results  Component Value Date   FERRITIN 28  10/31/2021   IRON 91 10/31/2021   TIBC 419 10/31/2021   UIBC 328 10/31/2021   IRONPCTSAT 22 10/31/2021   Lab Results  Component Value Date   RETICCTPCT 4.2 (H) 10/12/2022   RBC 3.14 (L) 10/12/2022   No results found for: "KPAFRELGTCHN", "LAMBDASER", "KAPLAMBRATIO" No results found for: "IGGSERUM", "IGA", "IGMSERUM" No results found for: "TOTALPROTELP", "ALBUMINELP", "A1GS", "A2GS", "BETS", "BETA2SER", "GAMS", "MSPIKE", "SPEI"   Chemistry      Component Value Date/Time   NA 140 10/12/2022 1423   NA 140 05/11/2019 0946   K 4.7 10/12/2022 1423   CL 102 10/12/2022 1423   CO2 29 10/12/2022 1423   BUN 22 10/12/2022 1423   BUN 17 05/11/2019 0946   CREATININE 1.20 10/12/2022 1423   CREATININE 0.75 04/10/2016 1132      Component Value Date/Time   CALCIUM 9.5 10/12/2022 1423   ALKPHOS 59 10/12/2022 1423   AST 52 (H) 10/12/2022 1423   ALT 41 10/12/2022 1423   BILITOT 1.6 (H) 10/12/2022 1423       Impression and Plan: Lonnie Hansen is a very pleasant 72 yo African American gentleman with chronic ITP.    Overall counts look much improved and are reassuring.  Counts reviewed with Dr. Myna Hidalgo who wishes to proceed forward with Nplate today.   Hgb trending upwards- iron studies pending.   Weekly labs +- Nplate RTC 1 month MD, labs, Nplate    Rushie Chestnut, [place] 9/16/20243:39 PM Get

## 2022-10-13 DIAGNOSIS — N3000 Acute cystitis without hematuria: Secondary | ICD-10-CM | POA: Diagnosis not present

## 2022-10-13 LAB — IRON AND IRON BINDING CAPACITY (CC-WL,HP ONLY)
Iron: 66 ug/dL (ref 45–182)
Saturation Ratios: 20 % (ref 17.9–39.5)
TIBC: 337 ug/dL (ref 250–450)
UIBC: 271 ug/dL (ref 117–376)

## 2022-10-15 ENCOUNTER — Other Ambulatory Visit: Payer: Self-pay

## 2022-10-15 DIAGNOSIS — D62 Acute posthemorrhagic anemia: Secondary | ICD-10-CM | POA: Diagnosis not present

## 2022-10-15 DIAGNOSIS — I9581 Postprocedural hypotension: Secondary | ICD-10-CM | POA: Diagnosis not present

## 2022-10-15 DIAGNOSIS — Z48812 Encounter for surgical aftercare following surgery on the circulatory system: Secondary | ICD-10-CM | POA: Diagnosis not present

## 2022-10-15 DIAGNOSIS — K922 Gastrointestinal hemorrhage, unspecified: Secondary | ICD-10-CM | POA: Diagnosis not present

## 2022-10-15 DIAGNOSIS — I723 Aneurysm of iliac artery: Secondary | ICD-10-CM

## 2022-10-15 DIAGNOSIS — D693 Immune thrombocytopenic purpura: Secondary | ICD-10-CM | POA: Diagnosis not present

## 2022-10-15 DIAGNOSIS — I129 Hypertensive chronic kidney disease with stage 1 through stage 4 chronic kidney disease, or unspecified chronic kidney disease: Secondary | ICD-10-CM | POA: Diagnosis not present

## 2022-10-16 ENCOUNTER — Other Ambulatory Visit: Payer: Self-pay

## 2022-10-16 DIAGNOSIS — D693 Immune thrombocytopenic purpura: Secondary | ICD-10-CM

## 2022-10-19 ENCOUNTER — Inpatient Hospital Stay: Payer: Medicare Other

## 2022-10-19 DIAGNOSIS — Z79899 Other long term (current) drug therapy: Secondary | ICD-10-CM | POA: Diagnosis not present

## 2022-10-19 DIAGNOSIS — D693 Immune thrombocytopenic purpura: Secondary | ICD-10-CM | POA: Diagnosis not present

## 2022-10-19 DIAGNOSIS — Z7902 Long term (current) use of antithrombotics/antiplatelets: Secondary | ICD-10-CM | POA: Diagnosis not present

## 2022-10-19 LAB — CBC WITH DIFFERENTIAL (CANCER CENTER ONLY)
Abs Immature Granulocytes: 0.08 10*3/uL — ABNORMAL HIGH (ref 0.00–0.07)
Basophils Absolute: 0 10*3/uL (ref 0.0–0.1)
Basophils Relative: 1 %
Eosinophils Absolute: 0.3 10*3/uL (ref 0.0–0.5)
Eosinophils Relative: 3 %
HCT: 31.8 % — ABNORMAL LOW (ref 39.0–52.0)
Hemoglobin: 10.2 g/dL — ABNORMAL LOW (ref 13.0–17.0)
Immature Granulocytes: 1 %
Lymphocytes Relative: 9 %
Lymphs Abs: 0.7 10*3/uL (ref 0.7–4.0)
MCH: 30.1 pg (ref 26.0–34.0)
MCHC: 32.1 g/dL (ref 30.0–36.0)
MCV: 93.8 fL (ref 80.0–100.0)
Monocytes Absolute: 0.7 10*3/uL (ref 0.1–1.0)
Monocytes Relative: 9 %
Neutro Abs: 6.2 10*3/uL (ref 1.7–7.7)
Neutrophils Relative %: 77 %
Platelet Count: 116 10*3/uL — ABNORMAL LOW (ref 150–400)
RBC: 3.39 MIL/uL — ABNORMAL LOW (ref 4.22–5.81)
RDW: 16.4 % — ABNORMAL HIGH (ref 11.5–15.5)
WBC Count: 7.9 10*3/uL (ref 4.0–10.5)
nRBC: 0 % (ref 0.0–0.2)

## 2022-10-19 NOTE — Progress Notes (Signed)
Plt 116. No injection per MD.

## 2022-10-20 DIAGNOSIS — I129 Hypertensive chronic kidney disease with stage 1 through stage 4 chronic kidney disease, or unspecified chronic kidney disease: Secondary | ICD-10-CM | POA: Diagnosis not present

## 2022-10-20 DIAGNOSIS — I9581 Postprocedural hypotension: Secondary | ICD-10-CM | POA: Diagnosis not present

## 2022-10-20 DIAGNOSIS — Z48812 Encounter for surgical aftercare following surgery on the circulatory system: Secondary | ICD-10-CM | POA: Diagnosis not present

## 2022-10-20 DIAGNOSIS — D693 Immune thrombocytopenic purpura: Secondary | ICD-10-CM | POA: Diagnosis not present

## 2022-10-20 DIAGNOSIS — K922 Gastrointestinal hemorrhage, unspecified: Secondary | ICD-10-CM | POA: Diagnosis not present

## 2022-10-20 DIAGNOSIS — D62 Acute posthemorrhagic anemia: Secondary | ICD-10-CM | POA: Diagnosis not present

## 2022-10-23 DIAGNOSIS — I129 Hypertensive chronic kidney disease with stage 1 through stage 4 chronic kidney disease, or unspecified chronic kidney disease: Secondary | ICD-10-CM | POA: Diagnosis not present

## 2022-10-23 DIAGNOSIS — D693 Immune thrombocytopenic purpura: Secondary | ICD-10-CM | POA: Diagnosis not present

## 2022-10-23 DIAGNOSIS — Z48812 Encounter for surgical aftercare following surgery on the circulatory system: Secondary | ICD-10-CM | POA: Diagnosis not present

## 2022-10-23 DIAGNOSIS — I9581 Postprocedural hypotension: Secondary | ICD-10-CM | POA: Diagnosis not present

## 2022-10-23 DIAGNOSIS — D62 Acute posthemorrhagic anemia: Secondary | ICD-10-CM | POA: Diagnosis not present

## 2022-10-23 DIAGNOSIS — K922 Gastrointestinal hemorrhage, unspecified: Secondary | ICD-10-CM | POA: Diagnosis not present

## 2022-10-26 ENCOUNTER — Inpatient Hospital Stay: Payer: Medicare Other

## 2022-10-26 VITALS — BP 148/82 | HR 62 | Temp 98.5°F

## 2022-10-26 DIAGNOSIS — D693 Immune thrombocytopenic purpura: Secondary | ICD-10-CM | POA: Diagnosis not present

## 2022-10-26 DIAGNOSIS — Z79899 Other long term (current) drug therapy: Secondary | ICD-10-CM | POA: Diagnosis not present

## 2022-10-26 DIAGNOSIS — Z7902 Long term (current) use of antithrombotics/antiplatelets: Secondary | ICD-10-CM | POA: Diagnosis not present

## 2022-10-26 LAB — CBC WITH DIFFERENTIAL (CANCER CENTER ONLY)
Abs Immature Granulocytes: 0.04 10*3/uL (ref 0.00–0.07)
Basophils Absolute: 0 10*3/uL (ref 0.0–0.1)
Basophils Relative: 1 %
Eosinophils Absolute: 0.2 10*3/uL (ref 0.0–0.5)
Eosinophils Relative: 4 %
HCT: 33.2 % — ABNORMAL LOW (ref 39.0–52.0)
Hemoglobin: 10.6 g/dL — ABNORMAL LOW (ref 13.0–17.0)
Immature Granulocytes: 1 %
Lymphocytes Relative: 18 %
Lymphs Abs: 0.8 10*3/uL (ref 0.7–4.0)
MCH: 30.6 pg (ref 26.0–34.0)
MCHC: 31.9 g/dL (ref 30.0–36.0)
MCV: 96 fL (ref 80.0–100.0)
Monocytes Absolute: 0.5 10*3/uL (ref 0.1–1.0)
Monocytes Relative: 11 %
Neutro Abs: 3.1 10*3/uL (ref 1.7–7.7)
Neutrophils Relative %: 65 %
Platelet Count: 82 10*3/uL — ABNORMAL LOW (ref 150–400)
RBC: 3.46 MIL/uL — ABNORMAL LOW (ref 4.22–5.81)
RDW: 17 % — ABNORMAL HIGH (ref 11.5–15.5)
WBC Count: 4.7 10*3/uL (ref 4.0–10.5)
nRBC: 0 % (ref 0.0–0.2)

## 2022-10-26 MED ORDER — ROMIPLOSTIM 125 MCG ~~LOC~~ SOLR
95.0000 ug | Freq: Once | SUBCUTANEOUS | Status: AC
  Administered 2022-10-26: 95 ug via SUBCUTANEOUS
  Filled 2022-10-26: qty 0.19

## 2022-10-26 NOTE — Patient Instructions (Signed)
Romiplostim Injection What is this medication? ROMIPLOSTIM (roe mi PLOE stim) treats low levels of platelets in your body caused by immune thrombocytopenia (ITP). It is prescribed when other medications have not worked or cannot be tolerated. It may also be used to help people who have been exposed to high doses of radiation. It works by increasing the amount of platelets in your blood. This lowers the risk of bleeding. This medicine may be used for other purposes; ask your health care provider or pharmacist if you have questions. COMMON BRAND NAME(S): Nplate What should I tell my care team before I take this medication? They need to know if you have any of these conditions: Blood clots Myelodysplastic syndrome An unusual or allergic reaction to romiplostim, mannitol, other medications, foods, dyes, or preservatives Pregnant or trying to get pregnant Breast-feeding How should I use this medication? This medication is injected under the skin. It is given by a care team in a hospital or clinic setting. A special MedGuide will be given to you before each treatment. Be sure to read this information carefully each time. Talk to your care team about the use of this medication in children. While it may be prescribed for children as young as newborns for selected conditions, precautions do apply. Overdosage: If you think you have taken too much of this medicine contact a poison control center or emergency room at once. NOTE: This medicine is only for you. Do not share this medicine with others. What if I miss a dose? Keep appointments for follow-up doses. It is important not to miss your dose. Call your care team if you are unable to keep an appointment. What may interact with this medication? Interactions are not expected. This list may not describe all possible interactions. Give your health care provider a list of all the medicines, herbs, non-prescription drugs, or dietary supplements you use. Also  tell them if you smoke, drink alcohol, or use illegal drugs. Some items may interact with your medicine. What should I watch for while using this medication? Visit your care team for regular checks on your progress. You may need blood work done while you are taking this medication. Your condition will be monitored carefully while you are receiving this medication. It is important not to miss any appointments. What side effects may I notice from receiving this medication? Side effects that you should report to your care team as soon as possible: Allergic reactions--skin rash, itching, hives, swelling of the face, lips, tongue, or throat Blood clot--pain, swelling, or warmth in the leg, shortness of breath, chest pain Side effects that usually do not require medical attention (report to your care team if they continue or are bothersome): Dizziness Joint pain Muscle pain Pain in the hands or feet Stomach pain Trouble sleeping This list may not describe all possible side effects. Call your doctor for medical advice about side effects. You may report side effects to FDA at 1-800-FDA-1088. Where should I keep my medication? This medication is given in a hospital or clinic. It will not be stored at home. NOTE: This sheet is a summary. It may not cover all possible information. If you have questions about this medicine, talk to your doctor, pharmacist, or health care provider.  2024 Elsevier/Gold Standard (2021-05-19 00:00:00)

## 2022-10-27 ENCOUNTER — Encounter: Payer: Self-pay | Admitting: Hematology & Oncology

## 2022-10-28 ENCOUNTER — Ambulatory Visit (INDEPENDENT_AMBULATORY_CARE_PROVIDER_SITE_OTHER): Payer: Medicare Other | Admitting: Physician Assistant

## 2022-10-28 VITALS — BP 153/90 | HR 61 | Temp 98.0°F | Resp 18 | Ht 69.0 in | Wt 183.0 lb

## 2022-10-28 DIAGNOSIS — I723 Aneurysm of iliac artery: Secondary | ICD-10-CM

## 2022-10-28 DIAGNOSIS — Z23 Encounter for immunization: Secondary | ICD-10-CM | POA: Diagnosis not present

## 2022-10-28 DIAGNOSIS — S35512S Injury of left iliac artery, sequela: Secondary | ICD-10-CM

## 2022-10-28 DIAGNOSIS — I10 Essential (primary) hypertension: Secondary | ICD-10-CM | POA: Diagnosis not present

## 2022-10-28 DIAGNOSIS — Z952 Presence of prosthetic heart valve: Secondary | ICD-10-CM | POA: Diagnosis not present

## 2022-10-28 NOTE — Progress Notes (Signed)
POST OPERATIVE OFFICE NOTE    CC:  F/u for surgery  HPI:  [name] who is s/p Aortogram, stent of left hypogastric artery and left external iliac artery on 10/05/22 by Dr. Randie Heinz. This was performed for treatment of hypogastric artery aneurysm. Initially postoperatively he did very well and then was noted to be hypotensive with severe abdominal pain and expanding hematoma that appeared to be preperitoneal on his abdomen. He emergently was taken to the OR where he underwent exploration of left common femoral artery with repair of arteriotomy, open exploration of left common and external iliac arteries via retroperitoneal incision with Angiogram of left common iliac artery, stenting of left hypogastric artery by Dr. Randie Heinz. Postoperatively he was admitted to the ICU for further resuscitation and kept intubated overnight. Postoperative day #1 hemorrhagic shock resolved and he was extubated. After 2 days in the ICU he was transferred to the stepdown unit and continued to progress well. He was ultimately discharged home in stable condition on 10/09/22.   Pt returns today for follow up.  Pt states overall he is doing well. He denies any pain. He has been having a lot of itching along incisions. He has been washing them daily with soap and water. He denies any pain in his legs on ambulation or rest. He is medically managed on statin. He says he is not taking Plavix. Has ITP so he is unable to take.   No Known Allergies  Current Outpatient Medications  Medication Sig Dispense Refill   atenolol (TENORMIN) 25 MG tablet Take 12.5 mg by mouth 2 (two) times daily. 180 tablet 3   clopidogrel (PLAVIX) 75 MG tablet Take 1 tablet (75 mg total) by mouth daily. 30 tablet 11   hydrocortisone cream 1 % Apply topically 2 (two) times daily. 30 g 0   latanoprost (XALATAN) 0.005 % ophthalmic solution Place 1 drop into both eyes 2 (two) times daily.     Multiple Vitamin (MULTIVITAMIN WITH MINERALS) TABS tablet Take  1 tablet by mouth daily.     pantoprazole (PROTONIX) 40 MG tablet Take 40 mg by mouth 2 (two) times daily.     rosuvastatin (CRESTOR) 20 MG tablet Take 20 mg by mouth daily.     traMADol (ULTRAM) 50 MG tablet Take 1 tablet (50 mg total) by mouth every 6 (six) hours as needed for severe pain. 20 tablet 0   No current facility-administered medications for this visit.     ROS:  See HPI  Physical Exam:  Vitals:   10/28/22 1116  BP: (!) 153/90  Pulse: 61  Resp: 18  Temp: 98 F (36.7 C)  SpO2: 96%    Incision: left RP incision intact and healing well, left groin incision healing well. Staples removed. Small hematoma is present around RP incision but appears to be resolving Extremities: BLE well perfused and warm  Neuro: alert and oriented Abdomen:  soft, non distended   Assessment/Plan:  [name] who is s/p: Aortogram, stent of left hypogastric artery and left external iliac artery on 10/05/22 by Dr. Randie Heinz. This was performed for treatment of hypogastric artery aneurysm. Initially postoperatively he did very well and then was noted to be hypotensive with severe abdominal pain and expanding hematoma that appeared to be preperitoneal on his abdomen. He emergently was taken to the OR where he underwent exploration of left common femoral artery with repair of arteriotomy, open exploration of left common and external iliac arteries via  retroperitoneal incision with Angiogram of left common iliac artery, stenting of left hypogastric artery by Dr. Randie Heinz.  - He is doing very well. He his incisions are intact and healing very well. Staples were removed today - Advised him to continue to wash them with mild soap and water, pat dry.  - continue Statin - He will follow up in 4-6 weeks with CTA abdomen/pelvis and will see Dr. Shann Medal, [facility] [phone]   Clinic MD:  Randie Heinz

## 2022-10-29 DIAGNOSIS — R351 Nocturia: Secondary | ICD-10-CM | POA: Diagnosis not present

## 2022-10-29 DIAGNOSIS — R3121 Asymptomatic microscopic hematuria: Secondary | ICD-10-CM | POA: Diagnosis not present

## 2022-10-29 DIAGNOSIS — N401 Enlarged prostate with lower urinary tract symptoms: Secondary | ICD-10-CM | POA: Diagnosis not present

## 2022-10-30 DIAGNOSIS — I9581 Postprocedural hypotension: Secondary | ICD-10-CM | POA: Diagnosis not present

## 2022-10-30 DIAGNOSIS — D62 Acute posthemorrhagic anemia: Secondary | ICD-10-CM | POA: Diagnosis not present

## 2022-10-30 DIAGNOSIS — I129 Hypertensive chronic kidney disease with stage 1 through stage 4 chronic kidney disease, or unspecified chronic kidney disease: Secondary | ICD-10-CM | POA: Diagnosis not present

## 2022-10-30 DIAGNOSIS — K922 Gastrointestinal hemorrhage, unspecified: Secondary | ICD-10-CM | POA: Diagnosis not present

## 2022-10-30 DIAGNOSIS — D693 Immune thrombocytopenic purpura: Secondary | ICD-10-CM | POA: Diagnosis not present

## 2022-10-30 DIAGNOSIS — Z48812 Encounter for surgical aftercare following surgery on the circulatory system: Secondary | ICD-10-CM | POA: Diagnosis not present

## 2022-10-31 DIAGNOSIS — D693 Immune thrombocytopenic purpura: Secondary | ICD-10-CM | POA: Diagnosis not present

## 2022-10-31 DIAGNOSIS — I129 Hypertensive chronic kidney disease with stage 1 through stage 4 chronic kidney disease, or unspecified chronic kidney disease: Secondary | ICD-10-CM | POA: Diagnosis not present

## 2022-10-31 DIAGNOSIS — K922 Gastrointestinal hemorrhage, unspecified: Secondary | ICD-10-CM | POA: Diagnosis not present

## 2022-10-31 DIAGNOSIS — D62 Acute posthemorrhagic anemia: Secondary | ICD-10-CM | POA: Diagnosis not present

## 2022-10-31 DIAGNOSIS — I9581 Postprocedural hypotension: Secondary | ICD-10-CM | POA: Diagnosis not present

## 2022-10-31 DIAGNOSIS — Z48812 Encounter for surgical aftercare following surgery on the circulatory system: Secondary | ICD-10-CM | POA: Diagnosis not present

## 2022-11-02 ENCOUNTER — Inpatient Hospital Stay: Payer: Medicare Other

## 2022-11-02 DIAGNOSIS — K922 Gastrointestinal hemorrhage, unspecified: Secondary | ICD-10-CM | POA: Diagnosis not present

## 2022-11-02 DIAGNOSIS — I9581 Postprocedural hypotension: Secondary | ICD-10-CM | POA: Diagnosis not present

## 2022-11-02 DIAGNOSIS — I129 Hypertensive chronic kidney disease with stage 1 through stage 4 chronic kidney disease, or unspecified chronic kidney disease: Secondary | ICD-10-CM | POA: Diagnosis not present

## 2022-11-02 DIAGNOSIS — D62 Acute posthemorrhagic anemia: Secondary | ICD-10-CM | POA: Diagnosis not present

## 2022-11-02 DIAGNOSIS — Z48812 Encounter for surgical aftercare following surgery on the circulatory system: Secondary | ICD-10-CM | POA: Diagnosis not present

## 2022-11-02 DIAGNOSIS — D693 Immune thrombocytopenic purpura: Secondary | ICD-10-CM | POA: Diagnosis not present

## 2022-11-03 ENCOUNTER — Ambulatory Visit
Admission: RE | Admit: 2022-11-03 | Discharge: 2022-11-03 | Disposition: A | Discharge status: Home / Self Care | Payer: Medicare Other | Source: Ambulatory Visit | Attending: Vascular Surgery | Admitting: Vascular Surgery

## 2022-11-03 DIAGNOSIS — I723 Aneurysm of iliac artery: Secondary | ICD-10-CM

## 2022-11-03 DIAGNOSIS — Z95828 Presence of other vascular implants and grafts: Secondary | ICD-10-CM | POA: Diagnosis not present

## 2022-11-03 MED ORDER — IOPAMIDOL (ISOVUE-370) INJECTION 76%
75.0000 mL | Freq: Once | INTRAVENOUS | Status: AC | PRN
  Administered 2022-11-03: 75 mL via INTRAVENOUS

## 2022-11-04 ENCOUNTER — Inpatient Hospital Stay: Payer: Medicare Other

## 2022-11-04 ENCOUNTER — Inpatient Hospital Stay: Payer: Medicare Other | Attending: Hematology & Oncology | Admitting: Hematology & Oncology

## 2022-11-04 ENCOUNTER — Other Ambulatory Visit: Payer: Self-pay

## 2022-11-04 ENCOUNTER — Other Ambulatory Visit: Payer: Self-pay | Admitting: Surgery

## 2022-11-04 ENCOUNTER — Encounter: Payer: Self-pay | Admitting: Hematology & Oncology

## 2022-11-04 VITALS — BP 137/81 | HR 59 | Temp 98.5°F | Resp 20 | Ht 69.0 in | Wt 182.0 lb

## 2022-11-04 DIAGNOSIS — D696 Thrombocytopenia, unspecified: Secondary | ICD-10-CM | POA: Diagnosis not present

## 2022-11-04 DIAGNOSIS — D693 Immune thrombocytopenic purpura: Secondary | ICD-10-CM | POA: Insufficient documentation

## 2022-11-04 DIAGNOSIS — I7121 Aneurysm of the ascending aorta, without rupture: Secondary | ICD-10-CM

## 2022-11-04 DIAGNOSIS — Z79899 Other long term (current) drug therapy: Secondary | ICD-10-CM | POA: Insufficient documentation

## 2022-11-04 DIAGNOSIS — Z7902 Long term (current) use of antithrombotics/antiplatelets: Secondary | ICD-10-CM | POA: Diagnosis not present

## 2022-11-04 LAB — CBC WITH DIFFERENTIAL (CANCER CENTER ONLY)
Abs Immature Granulocytes: 0.03 10*3/uL (ref 0.00–0.07)
Basophils Absolute: 0 10*3/uL (ref 0.0–0.1)
Basophils Relative: 0 %
Eosinophils Absolute: 0.2 10*3/uL (ref 0.0–0.5)
Eosinophils Relative: 3 %
HCT: 36.6 % — ABNORMAL LOW (ref 39.0–52.0)
Hemoglobin: 11.9 g/dL — ABNORMAL LOW (ref 13.0–17.0)
Immature Granulocytes: 1 %
Lymphocytes Relative: 16 %
Lymphs Abs: 0.9 10*3/uL (ref 0.7–4.0)
MCH: 30.4 pg (ref 26.0–34.0)
MCHC: 32.5 g/dL (ref 30.0–36.0)
MCV: 93.6 fL (ref 80.0–100.0)
Monocytes Absolute: 0.6 10*3/uL (ref 0.1–1.0)
Monocytes Relative: 11 %
Neutro Abs: 3.9 10*3/uL (ref 1.7–7.7)
Neutrophils Relative %: 69 %
Platelet Count: 111 10*3/uL — ABNORMAL LOW (ref 150–400)
RBC: 3.91 MIL/uL — ABNORMAL LOW (ref 4.22–5.81)
RDW: 15.9 % — ABNORMAL HIGH (ref 11.5–15.5)
WBC Count: 5.6 10*3/uL (ref 4.0–10.5)
nRBC: 0 % (ref 0.0–0.2)

## 2022-11-04 NOTE — Progress Notes (Signed)
Hematology and Oncology Follow Up Visit  Lonnie Hansen [id number] 03-13-1950 72 y.o. 11/04/2022   Principle Diagnosis:  Chronic ITP   Current Therapy:        Nplate to keep platelet count over 100,000   Interim History:  Lonnie Hansen is here today for follow-up.  He had his aneurysm surgery about a month ago.  Apparently, he had bleeding afterwards.  He had to go to the ICU.  Thankfully, the bleeding was not from thrombocytopenia.  I am not sure exactly where the bleeding was coming from.  He had a CT angiogram yesterday.  Unfortunate, this has not yet been read.  He is post be on Plavix.  He is afraid of taking the Plavix.  I told that from my point of view, it would be okay to take Plavix.  His platelet count is adequate.  He has had no problems with nausea or vomiting.  There is been no abdominal pain.  He has had no change in bowel or bladder habits.  He has had no cough or shortness of breath.  Overall, I would say that his performance status is probably ECOG 1.    Wt Readings from Last 3 Encounters:  11/04/22 182 lb (82.6 kg)  10/28/22 183 lb (83 kg)  10/12/22 187 lb 12.8 oz (85.2 kg)    Medications:  Allergies as of 11/04/2022   No Known Allergies      Medication List        Accurate as of November 04, 2022  2:14 PM. If you have any questions, ask your nurse or doctor.          STOP taking these medications    traMADol 50 MG tablet Commonly known as: ULTRAM Stopped by: Josph Macho       TAKE these medications    atenolol 25 MG tablet Commonly known as: TENORMIN Take 12.5 mg by mouth 2 (two) times daily.   clopidogrel 75 MG tablet Commonly known as: Plavix Take 1 tablet (75 mg total) by mouth daily.   doxycycline 100 MG capsule Commonly known as: VIBRAMYCIN Take 100 mg by mouth 2 (two) times daily.   hydrocortisone cream 1 % Apply topically 2 (two) times daily.   latanoprost 0.005 % ophthalmic solution Commonly known as: XALATAN Place 1  drop into both eyes 2 (two) times daily.   multivitamin with minerals Tabs tablet Take 1 tablet by mouth daily.   pantoprazole 40 MG tablet Commonly known as: PROTONIX Take 40 mg by mouth 2 (two) times daily.   rosuvastatin 20 MG tablet Commonly known as: CRESTOR Take 20 mg by mouth daily.        Allergies: No Known Allergies  Past Medical History, Surgical history, Social history, and Family History were reviewed and updated.  Review of Systems: All other 10 point review of systems is negative.   Physical Exam:  height is 5\' 9"  (1.753 m) and weight is 182 lb (82.6 kg). His oral temperature is 98.5 F (36.9 C). His blood pressure is 137/81 and his pulse is 59 (abnormal). His respiration is 20 and oxygen saturation is 100%.   Wt Readings from Last 3 Encounters:  11/04/22 182 lb (82.6 kg)  10/28/22 183 lb (83 kg)  10/12/22 187 lb 12.8 oz (85.2 kg)    Physical Exam Vitals reviewed.  HENT:     Head: Normocephalic and atraumatic.  Eyes:     Pupils: Pupils are equal, round, and reactive to light.  Cardiovascular:  Rate and Rhythm: Normal rate and regular rhythm.     Heart sounds: Normal heart sounds.  Pulmonary:     Effort: Pulmonary effort is normal.     Breath sounds: Normal breath sounds.  Abdominal:     General: Bowel sounds are normal.     Palpations: Abdomen is soft.  Musculoskeletal:        General: No tenderness or deformity. Normal range of motion.     Cervical back: Normal range of motion.  Lymphadenopathy:     Cervical: No cervical adenopathy.  Skin:    General: Skin is warm and dry.     Findings: No erythema or rash.  Neurological:     Mental Status: He is alert and oriented to person, place, and time.  Psychiatric:        Behavior: Behavior normal.        Thought Content: Thought content normal.        Judgment: Judgment normal.     Lab Results  Component Value Date   WBC 5.6 11/04/2022   HGB 11.9 (L) 11/04/2022   HCT 36.6 (L) 11/04/2022    MCV 93.6 11/04/2022   PLT 111 (L) 11/04/2022   Lab Results  Component Value Date   FERRITIN 221 10/12/2022   IRON 66 10/12/2022   TIBC 337 10/12/2022   UIBC 271 10/12/2022   IRONPCTSAT 20 10/12/2022   Lab Results  Component Value Date   RETICCTPCT 4.2 (H) 10/12/2022   RBC 3.91 (L) 11/04/2022   No results found for: "KPAFRELGTCHN", "LAMBDASER", "KAPLAMBRATIO" No results found for: "IGGSERUM", "IGA", "IGMSERUM" No results found for: "TOTALPROTELP", "ALBUMINELP", "A1GS", "A2GS", "BETS", "BETA2SER", "GAMS", "MSPIKE", "SPEI"   Chemistry      Component Value Date/Time   NA 140 10/12/2022 1423   NA 140 05/11/2019 0946   K 4.7 10/12/2022 1423   CL 102 10/12/2022 1423   CO2 29 10/12/2022 1423   BUN 22 10/12/2022 1423   BUN 17 05/11/2019 0946   CREATININE 1.20 10/12/2022 1423   CREATININE 0.75 04/10/2016 1132      Component Value Date/Time   CALCIUM 9.5 10/12/2022 1423   ALKPHOS 59 10/12/2022 1423   AST 52 (H) 10/12/2022 1423   ALT 41 10/12/2022 1423   BILITOT 1.6 (H) 10/12/2022 1423       Impression and Plan: Lonnie Hansen is a very pleasant 72 yo African American gentleman with chronic ITP.   He got his platelet count at a decent level for him to have surgery.  Again, I am not sure where he had the bleeding from afterwards.  I do not think he needs any nplate today.  We will go ahead and plan to get him back to see [place] in another month or so.    Josph Macho, MD 10/9/20242:14 PM Get

## 2022-11-05 ENCOUNTER — Telehealth: Payer: Self-pay

## 2022-11-05 DIAGNOSIS — K922 Gastrointestinal hemorrhage, unspecified: Secondary | ICD-10-CM | POA: Diagnosis not present

## 2022-11-05 DIAGNOSIS — I9581 Postprocedural hypotension: Secondary | ICD-10-CM | POA: Diagnosis not present

## 2022-11-05 DIAGNOSIS — D62 Acute posthemorrhagic anemia: Secondary | ICD-10-CM | POA: Diagnosis not present

## 2022-11-05 DIAGNOSIS — I129 Hypertensive chronic kidney disease with stage 1 through stage 4 chronic kidney disease, or unspecified chronic kidney disease: Secondary | ICD-10-CM | POA: Diagnosis not present

## 2022-11-05 DIAGNOSIS — D693 Immune thrombocytopenic purpura: Secondary | ICD-10-CM | POA: Diagnosis not present

## 2022-11-05 DIAGNOSIS — Z48812 Encounter for surgical aftercare following surgery on the circulatory system: Secondary | ICD-10-CM | POA: Diagnosis not present

## 2022-11-05 NOTE — Telephone Encounter (Signed)
Called pt, two identifiers. Relayed Dr. Darcella Cheshire advice concerning Plavix. Confirmed understanding.

## 2022-11-05 NOTE — Telephone Encounter (Signed)
-----   Message from Lemar Livings sent at 11/04/2022  9:29 PM EDT ----- Regarding: RE: Please Advise Ok without plavix ----- Message ----- From: Hurshel Keys, RN Sent: 11/04/2022   3:51 PM EDT To: Maeola Harman, MD Subject: Please Advise                                  Dr. Randie Heinz,  This pt has not been taking Plavix d/t low platelet count. He saw his heme/onc today and his platelets are 111. That MD was concerned and wanted to know if he is supposed to be taking the Plavix or not. Please advise on what the pt needs to do.  Thanks, Lawanna Kobus, RN - Triage

## 2022-11-16 DIAGNOSIS — G4733 Obstructive sleep apnea (adult) (pediatric): Secondary | ICD-10-CM | POA: Diagnosis not present

## 2022-11-16 DIAGNOSIS — D693 Immune thrombocytopenic purpura: Secondary | ICD-10-CM | POA: Diagnosis not present

## 2022-11-16 DIAGNOSIS — I2510 Atherosclerotic heart disease of native coronary artery without angina pectoris: Secondary | ICD-10-CM | POA: Diagnosis not present

## 2022-11-16 DIAGNOSIS — I10 Essential (primary) hypertension: Secondary | ICD-10-CM | POA: Diagnosis not present

## 2022-11-18 ENCOUNTER — Encounter: Payer: Medicare Other | Admitting: Vascular Surgery

## 2022-11-18 ENCOUNTER — Encounter: Payer: Self-pay | Admitting: Vascular Surgery

## 2022-11-18 ENCOUNTER — Ambulatory Visit (INDEPENDENT_AMBULATORY_CARE_PROVIDER_SITE_OTHER): Payer: Medicare Other | Admitting: Vascular Surgery

## 2022-11-18 VITALS — BP 131/76 | HR 60 | Temp 98.6°F | Ht 69.0 in | Wt 186.2 lb

## 2022-11-18 DIAGNOSIS — I723 Aneurysm of iliac artery: Secondary | ICD-10-CM

## 2022-11-18 DIAGNOSIS — S35512S Injury of left iliac artery, sequela: Secondary | ICD-10-CM

## 2022-11-18 NOTE — Progress Notes (Signed)
Subjective:     Patient ID: Lonnie Hansen, male   DOB: [birth year], 72 y.o.   MRN: [id number]  HPI 72 year old male status post previous EVAR and more recently underwent stenting of his left hypogastric and left external iliac arteries unfortunately had hypotension and recovery and was taken for retroperitoneal exploration ultimately no identifiable bleeding source was found.  He still has numbness in the left groin but otherwise has recovered well is having a normal diet and normal bowel movements and is back to playing the blue note every Wednesday night with plans to play tonight.  He still walks with limitation of hip pain but not having any leg cramping.   Review of Systems Numbness left groin    Objective:   Physical Exam Vitals:   11/18/22 1001  BP: 131/76  Pulse: 60  Temp: 98.6 F (37 C)  SpO2: 98%   Awake alert oriented Nonlabored respirations 2+ palpable bilateral common femoral pulses 2+ left dorsalis pedis pulse     CT IMPRESSION: VASCULAR   1. 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 2. 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 3. 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  Assessment/plan     72 year old male status post above-noted complicated repair of left hypogastric artery aneurysm.  There is a dissection flap in the left extrailiac artery distal to the stent however he has no flow limitation notable on CT with a bounding left common femoral pulse and as such with his underlying bleeding propensity I would not recommend any further intervention unless absolutely necessary.  Patient is in agreement with this.  He is okay from a vascular standpoint for hip replacement as deemed necessary.  He will  follow-up in 6 months with EVAR duplex.    Dalesha Stanback C. Randie Heinz, MD [facility] Office: [phone] Pager: [phone]

## 2022-11-19 ENCOUNTER — Encounter: Payer: Medicare Other | Admitting: Vascular Surgery

## 2022-12-01 ENCOUNTER — Emergency Department (HOSPITAL_BASED_OUTPATIENT_CLINIC_OR_DEPARTMENT_OTHER)
Admission: EM | Admit: 2022-12-01 | Discharge: 2022-12-01 | Discharge status: Against Medical Advice | Payer: Medicare Other | Attending: Nurse Practitioner | Admitting: Nurse Practitioner

## 2022-12-01 ENCOUNTER — Encounter (HOSPITAL_BASED_OUTPATIENT_CLINIC_OR_DEPARTMENT_OTHER): Payer: Self-pay

## 2022-12-01 ENCOUNTER — Other Ambulatory Visit: Payer: Self-pay

## 2022-12-01 DIAGNOSIS — R040 Epistaxis: Secondary | ICD-10-CM | POA: Insufficient documentation

## 2022-12-01 DIAGNOSIS — Z5321 Procedure and treatment not carried out due to patient leaving prior to being seen by health care provider: Secondary | ICD-10-CM | POA: Insufficient documentation

## 2022-12-01 LAB — CBC WITH DIFFERENTIAL/PLATELET
Abs Immature Granulocytes: 0.02 10*3/uL (ref 0.00–0.07)
Basophils Absolute: 0 10*3/uL (ref 0.0–0.1)
Basophils Relative: 0 %
Eosinophils Absolute: 0.1 10*3/uL (ref 0.0–0.5)
Eosinophils Relative: 2 %
HCT: 35 % — ABNORMAL LOW (ref 39.0–52.0)
Hemoglobin: 11.7 g/dL — ABNORMAL LOW (ref 13.0–17.0)
Immature Granulocytes: 0 %
Lymphocytes Relative: 15 %
Lymphs Abs: 0.9 10*3/uL (ref 0.7–4.0)
MCH: 30.9 pg (ref 26.0–34.0)
MCHC: 33.4 g/dL (ref 30.0–36.0)
MCV: 92.3 fL (ref 80.0–100.0)
Monocytes Absolute: 0.5 10*3/uL (ref 0.1–1.0)
Monocytes Relative: 9 %
Neutro Abs: 4.1 10*3/uL (ref 1.7–7.7)
Neutrophils Relative %: 74 %
Platelets: 61 10*3/uL — ABNORMAL LOW (ref 150–400)
RBC: 3.79 MIL/uL — ABNORMAL LOW (ref 4.22–5.81)
RDW: 14.5 % (ref 11.5–15.5)
WBC: 5.6 10*3/uL (ref 4.0–10.5)
nRBC: 0 % (ref 0.0–0.2)

## 2022-12-01 NOTE — ED Provider Triage Note (Signed)
Emergency Medicine Provider Triage Evaluation Note  QUANTAVIUS HUMM , a 72 y.o. male  was evaluated in triage.  Pt complains of nosebleed. Patient states he woke up this morning with bleeding from left nare. He was unable to control. History of ITP, followed by Dr. Myna Hidalgo.  Review of Systems  Positive: nosebleed Negative: Abdominal pain, nausea, vomiting  Physical Exam  BP (!) 167/87 (BP Location: Left Arm)   Pulse 81   Temp 98 F (36.7 C)   Resp 18   Ht 5\' 9"  (1.753 m)   Wt 84.4 kg   SpO2 100%   BMI 27.47 kg/m  Gen:   Awake, no distress   Resp:  Normal effort  MSK:   Moves extremities without difficulty  Other:  No active bleeding from nares at present  Medical Decision Making  Medically screening exam initiated at 5:12 PM.  Appropriate orders placed.  Gretchen Portela was informed that the remainder of the evaluation will be completed by another provider, this initial triage assessment does not replace that evaluation, and the importance of remaining in the ED until their evaluation is complete.     Felicie Morn, NP 12/01/22 1715

## 2022-12-01 NOTE — ED Notes (Signed)
Pt noted to be leaving department by registration.

## 2022-12-01 NOTE — ED Triage Notes (Signed)
Pt reports nosebleed that started in his left nare today around 1500. Hx of thrombocytopenia. He arrives with tissue in his left nare, which seems to have stopped the bleeding.

## 2022-12-07 ENCOUNTER — Encounter: Payer: Self-pay | Admitting: Hematology & Oncology

## 2022-12-08 ENCOUNTER — Other Ambulatory Visit: Payer: Self-pay

## 2022-12-08 ENCOUNTER — Inpatient Hospital Stay (HOSPITAL_BASED_OUTPATIENT_CLINIC_OR_DEPARTMENT_OTHER): Payer: Medicare Other | Admitting: Medical Oncology

## 2022-12-08 ENCOUNTER — Inpatient Hospital Stay: Payer: Medicare Other | Attending: Hematology & Oncology

## 2022-12-08 ENCOUNTER — Encounter: Payer: Self-pay | Admitting: Medical Oncology

## 2022-12-08 ENCOUNTER — Inpatient Hospital Stay: Payer: Medicare Other

## 2022-12-08 VITALS — BP 147/83 | HR 63 | Temp 98.6°F | Resp 18 | Ht 69.0 in | Wt 183.0 lb

## 2022-12-08 DIAGNOSIS — D693 Immune thrombocytopenic purpura: Secondary | ICD-10-CM | POA: Insufficient documentation

## 2022-12-08 DIAGNOSIS — Z79899 Other long term (current) drug therapy: Secondary | ICD-10-CM | POA: Diagnosis not present

## 2022-12-08 DIAGNOSIS — I7143 Infrarenal abdominal aortic aneurysm, without rupture: Secondary | ICD-10-CM

## 2022-12-08 LAB — CBC WITH DIFFERENTIAL (CANCER CENTER ONLY)
Abs Immature Granulocytes: 0.03 10*3/uL (ref 0.00–0.07)
Basophils Absolute: 0 10*3/uL (ref 0.0–0.1)
Basophils Relative: 0 %
Eosinophils Absolute: 0.2 10*3/uL (ref 0.0–0.5)
Eosinophils Relative: 3 %
HCT: 36.1 % — ABNORMAL LOW (ref 39.0–52.0)
Hemoglobin: 12.2 g/dL — ABNORMAL LOW (ref 13.0–17.0)
Immature Granulocytes: 1 %
Lymphocytes Relative: 16 %
Lymphs Abs: 1 10*3/uL (ref 0.7–4.0)
MCH: 31.6 pg (ref 26.0–34.0)
MCHC: 33.8 g/dL (ref 30.0–36.0)
MCV: 93.5 fL (ref 80.0–100.0)
Monocytes Absolute: 0.5 10*3/uL (ref 0.1–1.0)
Monocytes Relative: 8 %
Neutro Abs: 4.6 10*3/uL (ref 1.7–7.7)
Neutrophils Relative %: 72 %
Platelet Count: 64 10*3/uL — ABNORMAL LOW (ref 150–400)
RBC: 3.86 MIL/uL — ABNORMAL LOW (ref 4.22–5.81)
RDW: 14.2 % (ref 11.5–15.5)
WBC Count: 6.4 10*3/uL (ref 4.0–10.5)
nRBC: 0 % (ref 0.0–0.2)

## 2022-12-08 LAB — CMP (CANCER CENTER ONLY)
ALT: 11 U/L (ref 0–44)
AST: 22 U/L (ref 15–41)
Albumin: 4.4 g/dL (ref 3.5–5.0)
Alkaline Phosphatase: 40 U/L (ref 38–126)
Anion gap: 11 (ref 5–15)
BUN: 21 mg/dL (ref 8–23)
CO2: 27 mmol/L (ref 22–32)
Calcium: 9.4 mg/dL (ref 8.9–10.3)
Chloride: 99 mmol/L (ref 98–111)
Creatinine: 1.49 mg/dL — ABNORMAL HIGH (ref 0.61–1.24)
GFR, Estimated: 49 mL/min — ABNORMAL LOW (ref >60)
Glucose, Bld: 100 mg/dL — ABNORMAL HIGH (ref 70–99)
Potassium: 4.4 mmol/L (ref 3.5–5.1)
Sodium: 137 mmol/L (ref 135–145)
Total Bilirubin: 1.1 mg/dL (ref <1.2)
Total Protein: 8.2 g/dL — ABNORMAL HIGH (ref 6.5–8.1)

## 2022-12-08 MED ORDER — ROMIPLOSTIM 125 MCG ~~LOC~~ SOLR
95.0000 ug | Freq: Once | SUBCUTANEOUS | Status: AC
  Administered 2022-12-08: 95 ug via SUBCUTANEOUS
  Filled 2022-12-08: qty 0.19

## 2022-12-08 NOTE — Progress Notes (Signed)
Hematology and Oncology Follow Up Visit  Lonnie Hansen [id number] 1950-03-13 72 y.o. 12/08/2022   Principle Diagnosis:  Chronic ITP   Current Therapy:        Nplate to keep platelet count over 100,000   Interim History:  Lonnie Hansen is here today for follow-up.    He is doing ok today but recently was seen in the ER for a nosebleed. Thought to be due to erosion from flonase use. He is off of Plavix. Since then no bleeding. There has been no bleeding to her knowledge: denies epistaxis, gingivitis, hemoptysis, hematemesis, hematuria, melena, excessive bruising, blood donation.   He has had no problems with nausea or vomiting.  There is been no abdominal pain.  He has had no change in bowel or bladder habits.  He has had no cough or shortness of breath.  Overall, I would say that his performance status is probably ECOG 1.    Wt Readings from Last 3 Encounters:  12/08/22 183 lb (83 kg)  12/01/22 186 lb (84.4 kg)  11/18/22 186 lb 3.2 oz (84.5 kg)    Medications:  Allergies as of 12/08/2022   No Known Allergies      Medication List        Accurate as of December 08, 2022  3:08 PM. If you have any questions, ask your nurse or doctor.          STOP taking these medications    clopidogrel 75 MG tablet Commonly known as: Plavix Stopped by: Rushie Chestnut       TAKE these medications    amLODipine 10 MG tablet Commonly known as: NORVASC Take 10 mg by mouth daily.   atenolol 25 MG tablet Commonly known as: TENORMIN Take 12.5 mg by mouth 2 (two) times daily.   doxycycline 100 MG capsule Commonly known as: VIBRAMYCIN Take 100 mg by mouth 2 (two) times daily.   hydrocortisone cream 1 % Apply topically 2 (two) times daily.   latanoprost 0.005 % ophthalmic solution Commonly known as: XALATAN Place 1 drop into both eyes 2 (two) times daily.   losartan-hydrochlorothiazide 100-12.5 MG tablet Commonly known as: HYZAAR Take 0.5 tablets by mouth daily. 1/2  tablet in the morning, 1/2 tablet at night time.   multivitamin with minerals Tabs tablet Take 1 tablet by mouth daily.   pantoprazole 40 MG tablet Commonly known as: PROTONIX Take 40 mg by mouth 2 (two) times daily.   rosuvastatin 20 MG tablet Commonly known as: CRESTOR Take 20 mg by mouth daily.        Allergies: No Known Allergies  Past Medical History, Surgical history, Social history, and Family History were reviewed and updated.  Review of Systems: All other 10 point review of systems is negative.   Physical Exam:  height is 5\' 9"  (1.753 m) and weight is 183 lb (83 kg). His oral temperature is 98.6 F (37 C). His blood pressure is 147/83 (abnormal) and his pulse is 63. His respiration is 18 and oxygen saturation is 100%.   Wt Readings from Last 3 Encounters:  12/08/22 183 lb (83 kg)  12/01/22 186 lb (84.4 kg)  11/18/22 186 lb 3.2 oz (84.5 kg)    Physical Exam Vitals reviewed.  HENT:     Head: Normocephalic and atraumatic.  Eyes:     Pupils: Pupils are equal, round, and reactive to light.  Cardiovascular:     Rate and Rhythm: Normal rate and regular rhythm.     Heart  sounds: Normal heart sounds.  Pulmonary:     Effort: Pulmonary effort is normal.     Breath sounds: Normal breath sounds.  Abdominal:     General: Bowel sounds are normal.     Palpations: Abdomen is soft.  Musculoskeletal:        General: No tenderness or deformity. Normal range of motion.     Cervical back: Normal range of motion.  Lymphadenopathy:     Cervical: No cervical adenopathy.  Skin:    General: Skin is warm and dry.     Findings: No erythema or rash.  Neurological:     Mental Status: He is alert and oriented to person, place, and time.  Psychiatric:        Behavior: Behavior normal.        Thought Content: Thought content normal.        Judgment: Judgment normal.      Lab Results  Component Value Date   WBC 6.4 12/08/2022   HGB 12.2 (L) 12/08/2022   HCT 36.1 (L)  12/08/2022   MCV 93.5 12/08/2022   PLT 64 (L) 12/08/2022   Lab Results  Component Value Date   FERRITIN 221 10/12/2022   IRON 66 10/12/2022   TIBC 337 10/12/2022   UIBC 271 10/12/2022   IRONPCTSAT 20 10/12/2022   Lab Results  Component Value Date   RETICCTPCT 4.2 (H) 10/12/2022   RBC 3.86 (L) 12/08/2022   No results found for: "KPAFRELGTCHN", "LAMBDASER", "KAPLAMBRATIO" No results found for: "IGGSERUM", "IGA", "IGMSERUM" No results found for: "TOTALPROTELP", "ALBUMINELP", "A1GS", "A2GS", "BETS", "BETA2SER", "GAMS", "MSPIKE", "SPEI"   Chemistry      Component Value Date/Time   NA 140 10/12/2022 1423   NA 140 05/11/2019 0946   K 4.7 10/12/2022 1423   CL 102 10/12/2022 1423   CO2 29 10/12/2022 1423   BUN 22 10/12/2022 1423   BUN 17 05/11/2019 0946   CREATININE 1.20 10/12/2022 1423   CREATININE 0.75 04/10/2016 1132      Component Value Date/Time   CALCIUM 9.5 10/12/2022 1423   ALKPHOS 59 10/12/2022 1423   AST 52 (H) 10/12/2022 1423   ALT 41 10/12/2022 1423   BILITOT 1.6 (H) 10/12/2022 1423       Impression and Plan: Lonnie Hansen is a very pleasant 72 yo African American gentleman with chronic ITP.    Currently Hgb looks better at 12.2 which is up from 8.6 in Sept 2024 after his post surgical bleed.  Platelets are 64.   Nplate today RTC 1 month APP, labs ( CBC w/, CMP)  Rushie Chestnut, PA-C 11/12/20243:08 PM Get

## 2022-12-08 NOTE — Patient Instructions (Signed)
Romiplostim Injection What is this medication? ROMIPLOSTIM (roe mi PLOE stim) treats low levels of platelets in your body caused by immune thrombocytopenia (ITP). It is prescribed when other medications have not worked or cannot be tolerated. It may also be used to help people who have been exposed to high doses of radiation. It works by increasing the amount of platelets in your blood. This lowers the risk of bleeding. This medicine may be used for other purposes; ask your health care provider or pharmacist if you have questions. COMMON BRAND NAME(S): Nplate What should I tell my care team before I take this medication? They need to know if you have any of these conditions: Blood clots Myelodysplastic syndrome An unusual or allergic reaction to romiplostim, mannitol, other medications, foods, dyes, or preservatives Pregnant or trying to get pregnant Breast-feeding How should I use this medication? This medication is injected under the skin. It is given by a care team in a hospital or clinic setting. A special MedGuide will be given to you before each treatment. Be sure to read this information carefully each time. Talk to your care team about the use of this medication in children. While it may be prescribed for children as young as newborns for selected conditions, precautions do apply. Overdosage: If you think you have taken too much of this medicine contact a poison control center or emergency room at once. NOTE: This medicine is only for you. Do not share this medicine with others. What if I miss a dose? Keep appointments for follow-up doses. It is important not to miss your dose. Call your care team if you are unable to keep an appointment. What may interact with this medication? Interactions are not expected. This list may not describe all possible interactions. Give your health care provider a list of all the medicines, herbs, non-prescription drugs, or dietary supplements you use. Also  tell them if you smoke, drink alcohol, or use illegal drugs. Some items may interact with your medicine. What should I watch for while using this medication? Visit your care team for regular checks on your progress. You may need blood work done while you are taking this medication. Your condition will be monitored carefully while you are receiving this medication. It is important not to miss any appointments. What side effects may I notice from receiving this medication? Side effects that you should report to your care team as soon as possible: Allergic reactions--skin rash, itching, hives, swelling of the face, lips, tongue, or throat Blood clot--pain, swelling, or warmth in the leg, shortness of breath, chest pain Side effects that usually do not require medical attention (report to your care team if they continue or are bothersome): Dizziness Joint pain Muscle pain Pain in the hands or feet Stomach pain Trouble sleeping This list may not describe all possible side effects. Call your doctor for medical advice about side effects. You may report side effects to FDA at 1-800-FDA-1088. Where should I keep my medication? This medication is given in a hospital or clinic. It will not be stored at home. NOTE: This sheet is a summary. It may not cover all possible information. If you have questions about this medicine, talk to your doctor, pharmacist, or health care provider.  2024 Elsevier/Gold Standard (2021-05-19 00:00:00)

## 2022-12-09 ENCOUNTER — Ambulatory Visit: Payer: Medicare Other | Admitting: Vascular Surgery

## 2022-12-21 ENCOUNTER — Ambulatory Visit
Admission: RE | Admit: 2022-12-21 | Discharge: 2022-12-21 | Disposition: A | Discharge status: Home / Self Care | Payer: Medicare Other | Source: Ambulatory Visit | Attending: Surgery | Admitting: Surgery

## 2022-12-21 DIAGNOSIS — I7122 Aneurysm of the aortic arch, without rupture: Secondary | ICD-10-CM | POA: Diagnosis not present

## 2022-12-21 DIAGNOSIS — I7121 Aneurysm of the ascending aorta, without rupture: Secondary | ICD-10-CM | POA: Diagnosis not present

## 2022-12-21 DIAGNOSIS — I7101 Dissection of ascending aorta: Secondary | ICD-10-CM | POA: Diagnosis not present

## 2022-12-21 MED ORDER — IOPAMIDOL (ISOVUE-370) INJECTION 76%
200.0000 mL | Freq: Once | INTRAVENOUS | Status: AC | PRN
  Administered 2022-12-21: 75 mL via INTRAVENOUS

## 2022-12-30 ENCOUNTER — Ambulatory Visit (INDEPENDENT_AMBULATORY_CARE_PROVIDER_SITE_OTHER): Payer: Medicare Other | Admitting: Surgery

## 2022-12-30 ENCOUNTER — Encounter: Payer: Self-pay | Admitting: Surgery

## 2022-12-30 VITALS — BP 157/88 | HR 60 | Resp 18 | Ht 69.0 in | Wt 184.0 lb

## 2022-12-30 DIAGNOSIS — I7121 Aneurysm of the ascending aorta, without rupture: Secondary | ICD-10-CM | POA: Diagnosis not present

## 2023-01-03 NOTE — Progress Notes (Signed)
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 Tyrone Sage on 09/15/2016 using an Edwards model 3300 TFX 29 mm pericardial valve and a 32 mm Gelweave Valsalva graft.  He has been followed since for aortic surveillance.  He has a history of ITP with anemia and has been undergoing regular iron infusions. He was admitted with a lower GI bleed and underwent coil embolization of the source of the acute sigmoid colon hemorrhage on 05/26/2021.  He was subsequently noted to have elevated LFTs and plans were made for an MRCP to assess for bile duct stones.  While waiting to have that done he developed right upper quadrant abdominal pain and nausea/vomiting and CT of the abdomen showed a thickened common bile duct wall and increased size of his abdominal aortic aneurysm.  He underwent MRCP which was concerning for choledocholithiasis and underwent ERCP on 08/19/2021.  After that procedure he developed hematemesis and hematochezia and was transferred to the ICU with intubation.  On 08/19/2021 he underwent coil embolization of the gastroduodenal artery and replaced right hepatic artery by IR.  He subsequently recovered from that.  He underwent treatment of the abdominal aortic aneurysm with endovascular aneurysm repair and coil embolization of the right hypogastric artery for aneurysm disease by Dr. Randie Heinz on 11/28/2021. Since I last saw him in May 2024 he underwent stenting of the left hypogastric artery for an enlarging aneurysm by Dr. Randie Heinz on 10/05/2022. This was complicated by postop bleeding and required open exploration of the left CFA with repair and stenting of the left hypogastric artery and open exploration of the left common and external iliac arteries via a retroperitoneal incision. 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   Current Outpatient Medications   Medication Sig Dispense Refill   amLODipine (NORVASC) 10 MG tablet Take 10 mg by mouth daily.     atenolol (TENORMIN) 25 MG tablet Take 12.5 mg by mouth 2 (two) times daily. 180 tablet 3   hydrocortisone cream 1 % Apply topically 2 (two) times daily. 30 g 0   latanoprost (XALATAN) 0.005 % ophthalmic solution Place 1 drop into both eyes 2 (two) times daily.     losartan-hydrochlorothiazide (HYZAAR) 100-12.5 MG tablet Take 0.5 tablets by mouth daily. 1/2 tablet in the morning, 1/2 tablet at night time.     Multiple Vitamin (MULTIVITAMIN WITH MINERALS) TABS tablet Take 1 tablet by mouth daily.     pantoprazole (PROTONIX) 40 MG tablet Take 40 mg by mouth 2 (two) times daily.     rosuvastatin (CRESTOR) 20 MG tablet Take 20 mg by mouth daily.     No current facility-administered medications for this visit.     Physical Exam: BP (!) 157/88 (BP Location: Right Arm, Patient Position: Sitting)   Pulse 60   Resp 18   Ht 5\' 9"  (1.753 m)   Wt 184 lb (83.5 kg)   SpO2 96% Comment: RA  BMI 27.17 kg/m  He looks well. Cardiac exam shows a regular rate and rhythm with normal heart sounds.  There is no murmur. Lungs are clear. There is no peripheral edema.  Diagnostic Tests:   Narrative & Impression  CLINICAL DATA:  Thoracic aortic aneurysm follow-up   EXAM: CT ANGIOGRAPHY CHEST WITH CONTRAST   TECHNIQUE: Multidetector CT imaging of the chest was performed using the standard protocol during bolus  administration of intravenous contrast. Multiplanar CT image reconstructions and MIPs were obtained to evaluate the vascular anatomy.   RADIATION DOSE REDUCTION: This exam was performed according to the departmental dose-optimization program which includes automated exposure control, adjustment of the mA and/or kV according to patient size and/or use of iterative reconstruction technique.   CONTRAST:  75mL ISOVUE-370 IOPAMIDOL (ISOVUE-370) INJECTION 76%   COMPARISON:  06/24/2022    FINDINGS: Cardiovascular: Heart size is within normal limits. Coronary calcifications are present.   Aorta:   Prosthetic aortic valve and partial replacement of ascending thoracic aorta are again seen.   Dilatation at the origin of the left main coronary artery measuring approximately 0.9 x 0.8 cm is unchanged from prior examination. Mild dilatation at the origin of the right coronary artery measuring 0.9 x 0.5 cm is unchanged from prior examination.   Previously described dissection flap in the distal native ascending thoracic aorta is unchanged. Previously described small dissection flap at the origin of the right brachiocephalic artery is unchanged. Previously seen mural thrombus along the right lateral wall of the aorta is unchanged. The amount of thrombus within the ascending thoracic aorta is also unchanged.   Annulus: 3.1 x 2.6 cm   Sinus of Valsalva: 4.1 x 4.0 x 4.1 cm   Sinotubular junction: 4.1 x 4.0 cm   Mid ascending thoracic aorta: 5.2 x 4.8 cm   Aortic arch: 4.8 x 1.8 cm   Proximal descending thoracic aorta: 3.9 x 3.7 cm   Distal descending thoracic aorta: 3.9 x 2.9 cm   Abdominal aortic endograft partially visualized.   Mediastinum/Nodes: No enlarged mediastinal, hilar, or axillary lymph nodes. Thyroid gland, trachea, and esophagus demonstrate no significant findings.   Lungs/Pleura: Lungs are clear. No pleural effusion or pneumothorax.   Upper Abdomen: 8 mm simple cyst again seen in the dome of the left hepatic lobe, unchanged from 08/15/2021. Left renal calcifications are unchanged from prior exam. Bilateral simple renal cysts measuring up to 2.3 cm on the left and 5.0 cm on the right do not require dedicated imaging surveillance. Postsurgical changes of cholecystectomy and GDA embolization are again seen. Diverticulosis of the visualized transverse and descending colon.   Musculoskeletal: No chest wall abnormality. No acute or  significant osseous findings.   Review of the MIP images confirms the above findings.   IMPRESSION: 1. Unchanged aneurysmal dilatation of the ascending thoracic aorta measuring up to 5.2 cm. Ascending thoracic aortic aneurysm. Recommend semi-annual imaging followup by CTA or MRA and referral to cardiothoracic surgery if not already obtained. This recommendation follows 2010 ACCF/AHA/AATS/ACR/ASA/SCA/SCAI/SIR/STS/SVM Guidelines for the Diagnosis and Management of Patients With Thoracic Aortic Disease. Circulation. 2010; 121: L244-W102. Aortic aneurysm NOS (ICD10-I71.9). 2. Unchanged aneurysm of the aortic arch measuring up to 4.8 cm. 3. Unchanged dissection flap in the distal ascending thoracic aorta at the origin of the right brachiocephalic artery. 4. Unchanged dilation at the origins of the left main and right coronary arteries. 5. Stable postsurgical changes of aortic valve replacement.     Electronically Signed   By: Acquanetta Belling M.D.   On: 12/21/2022 14:52      Impression: His distal ascending aorta has slowly been enlarging over the past few years. It was 4.7 cm two years ago, 4.9 cm last year and now 5.2 cm. His aortic arch is stable at 4.8 cm. 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 The false lumen is thrombosed. The descending  thoracic aorta is stable at 3.9 cm.  I do not think there is any indication for surgical treatment at this time.  Surgical treatment of this would require replacement of the aortic arch back to the previous surgical anastomosis in the ascending aorta.  This would be a very high risk surgery for this 72 year old gentleman with multiple co-morbidities who is still recovering from a complicated surgery on a left hypogastric artery aneurysm.   I reviewed the CTA images with him and answered all of his questions.  I stressed the importance of continued good blood pressure  control in preventing further enlargement and recurrent aortic dissection.  I advised him against doing any heavy lifting or strenuous physical activity that may require a Valsalva maneuver and could suddenly raise his blood pressure to high levels.   Plan:   I will plan to see him back in 6 months with a CTA of the chest for aortic surveillance.     I spent 20 minutes performing this established patient evaluation and > 50% of this time was spent face to face counseling and coordinating the care of this patient's aortic aneurysm.    Alleen Borne, MD [facility] [phone]    Alleen Borne, MD [facility] [phone]

## 2023-01-07 ENCOUNTER — Inpatient Hospital Stay: Payer: Medicare Other | Attending: Hematology & Oncology

## 2023-01-07 ENCOUNTER — Inpatient Hospital Stay: Payer: Medicare Other

## 2023-01-07 ENCOUNTER — Encounter: Payer: Self-pay | Admitting: Medical Oncology

## 2023-01-07 ENCOUNTER — Ambulatory Visit (HOSPITAL_BASED_OUTPATIENT_CLINIC_OR_DEPARTMENT_OTHER)
Admission: RE | Admit: 2023-01-07 | Discharge: 2023-01-07 | Disposition: A | Discharge status: Home / Self Care | Payer: Medicare Other | Source: Ambulatory Visit | Attending: Medical Oncology | Admitting: Medical Oncology

## 2023-01-07 ENCOUNTER — Inpatient Hospital Stay (HOSPITAL_BASED_OUTPATIENT_CLINIC_OR_DEPARTMENT_OTHER): Payer: Medicare Other | Admitting: Medical Oncology

## 2023-01-07 VITALS — BP 141/74 | HR 57 | Temp 98.9°F | Resp 19 | Ht 69.0 in | Wt 186.6 lb

## 2023-01-07 DIAGNOSIS — M79601 Pain in right arm: Secondary | ICD-10-CM | POA: Insufficient documentation

## 2023-01-07 DIAGNOSIS — D693 Immune thrombocytopenic purpura: Secondary | ICD-10-CM | POA: Diagnosis not present

## 2023-01-07 DIAGNOSIS — Z79899 Other long term (current) drug therapy: Secondary | ICD-10-CM | POA: Insufficient documentation

## 2023-01-07 DIAGNOSIS — D509 Iron deficiency anemia, unspecified: Secondary | ICD-10-CM

## 2023-01-07 DIAGNOSIS — R2231 Localized swelling, mass and lump, right upper limb: Secondary | ICD-10-CM | POA: Insufficient documentation

## 2023-01-07 DIAGNOSIS — L7632 Postprocedural hematoma of skin and subcutaneous tissue following other procedure: Secondary | ICD-10-CM | POA: Diagnosis not present

## 2023-01-07 LAB — CMP (CANCER CENTER ONLY)
ALT: 9 U/L (ref 0–44)
AST: 17 U/L (ref 15–41)
Albumin: 4.3 g/dL (ref 3.5–5.0)
Alkaline Phosphatase: 40 U/L (ref 38–126)
Anion gap: 8 (ref 5–15)
BUN: 24 mg/dL — ABNORMAL HIGH (ref 8–23)
CO2: 28 mmol/L (ref 22–32)
Calcium: 9.5 mg/dL (ref 8.9–10.3)
Chloride: 101 mmol/L (ref 98–111)
Creatinine: 1.53 mg/dL — ABNORMAL HIGH (ref 0.61–1.24)
GFR, Estimated: 48 mL/min — ABNORMAL LOW (ref >60)
Glucose, Bld: 92 mg/dL (ref 70–99)
Potassium: 4.2 mmol/L (ref 3.5–5.1)
Sodium: 137 mmol/L (ref 135–145)
Total Bilirubin: 0.8 mg/dL (ref <1.2)
Total Protein: 7.5 g/dL (ref 6.5–8.1)

## 2023-01-07 LAB — CBC WITH DIFFERENTIAL (CANCER CENTER ONLY)
Abs Immature Granulocytes: 0.03 10*3/uL (ref 0.00–0.07)
Basophils Absolute: 0 10*3/uL (ref 0.0–0.1)
Basophils Relative: 0 %
Eosinophils Absolute: 0.2 10*3/uL (ref 0.0–0.5)
Eosinophils Relative: 3 %
HCT: 33.5 % — ABNORMAL LOW (ref 39.0–52.0)
Hemoglobin: 11.4 g/dL — ABNORMAL LOW (ref 13.0–17.0)
Immature Granulocytes: 0 %
Lymphocytes Relative: 17 %
Lymphs Abs: 1.1 10*3/uL (ref 0.7–4.0)
MCH: 31.9 pg (ref 26.0–34.0)
MCHC: 34 g/dL (ref 30.0–36.0)
MCV: 93.8 fL (ref 80.0–100.0)
Monocytes Absolute: 0.7 10*3/uL (ref 0.1–1.0)
Monocytes Relative: 10 %
Neutro Abs: 4.8 10*3/uL (ref 1.7–7.7)
Neutrophils Relative %: 70 %
Platelet Count: 59 10*3/uL — ABNORMAL LOW (ref 150–400)
RBC: 3.57 MIL/uL — ABNORMAL LOW (ref 4.22–5.81)
RDW: 13.3 % (ref 11.5–15.5)
WBC Count: 6.9 10*3/uL (ref 4.0–10.5)
nRBC: 0 % (ref 0.0–0.2)

## 2023-01-07 MED ORDER — ROMIPLOSTIM 125 MCG ~~LOC~~ SOLR
95.0000 ug | Freq: Once | SUBCUTANEOUS | Status: AC
Start: 2023-01-07 — End: 2023-01-07
  Administered 2023-01-07: 95 ug via SUBCUTANEOUS
  Filled 2023-01-07: qty 0.19

## 2023-01-07 NOTE — Patient Instructions (Signed)
Romiplostim Injection What is this medication? ROMIPLOSTIM (roe mi PLOE stim) treats low levels of platelets in your body caused by immune thrombocytopenia (ITP). It is prescribed when other medications have not worked or cannot be tolerated. It may also be used to help people who have been exposed to high doses of radiation. It works by increasing the amount of platelets in your blood. This lowers the risk of bleeding. This medicine may be used for other purposes; ask your health care provider or pharmacist if you have questions. COMMON BRAND NAME(S): Nplate What should I tell my care team before I take this medication? They need to know if you have any of these conditions: Blood clots Myelodysplastic syndrome An unusual or allergic reaction to romiplostim, mannitol, other medications, foods, dyes, or preservatives Pregnant or trying to get pregnant Breast-feeding How should I use this medication? This medication is injected under the skin. It is given by a care team in a hospital or clinic setting. A special MedGuide will be given to you before each treatment. Be sure to read this information carefully each time. Talk to your care team about the use of this medication in children. While it may be prescribed for children as young as newborns for selected conditions, precautions do apply. Overdosage: If you think you have taken too much of this medicine contact a poison control center or emergency room at once. NOTE: This medicine is only for you. Do not share this medicine with others. What if I miss a dose? Keep appointments for follow-up doses. It is important not to miss your dose. Call your care team if you are unable to keep an appointment. What may interact with this medication? Interactions are not expected. This list may not describe all possible interactions. Give your health care provider a list of all the medicines, herbs, non-prescription drugs, or dietary supplements you use. Also  tell them if you smoke, drink alcohol, or use illegal drugs. Some items may interact with your medicine. What should I watch for while using this medication? Visit your care team for regular checks on your progress. You may need blood work done while you are taking this medication. Your condition will be monitored carefully while you are receiving this medication. It is important not to miss any appointments. What side effects may I notice from receiving this medication? Side effects that you should report to your care team as soon as possible: Allergic reactions--skin rash, itching, hives, swelling of the face, lips, tongue, or throat Blood clot--pain, swelling, or warmth in the leg, shortness of breath, chest pain Side effects that usually do not require medical attention (report to your care team if they continue or are bothersome): Dizziness Joint pain Muscle pain Pain in the hands or feet Stomach pain Trouble sleeping This list may not describe all possible side effects. Call your doctor for medical advice about side effects. You may report side effects to FDA at 1-800-FDA-1088. Where should I keep my medication? This medication is given in a hospital or clinic. It will not be stored at home. NOTE: This sheet is a summary. It may not cover all possible information. If you have questions about this medicine, talk to your doctor, pharmacist, or health care provider.  2024 Elsevier/Gold Standard (2021-05-19 00:00:00)

## 2023-01-07 NOTE — Progress Notes (Signed)
Hematology and Oncology Follow Up Visit  Lonnie Hansen [id number] 12-Jul-1950 72 y.o. 01/07/2023   Principle Diagnosis:  Chronic ITP   Current Therapy:        Nplate to keep platelet count over 100,000   Interim History:  Lonnie Hansen is here today for follow-up.    Today he reports that he is doing well.   He is off of plavix due to a significant nosebleed. Since then no bleeding. There has been no bleeding to her knowledge: denies epistaxis, gingivitis, hemoptysis, hematemesis, hematuria, melena, excessive bruising, blood donation. He does have a bruise on his right arm with a bump from contrast IV dye.   He has had no problems with nausea or vomiting.  There is been no abdominal pain.  He has had no change in bowel or bladder habits.  He has had no cough or shortness of breath. No chest pain.   Overall, I would say that his performance status is probably ECOG 1.    Wt Readings from Last 3 Encounters:  01/07/23 186 lb 9.6 oz (84.6 kg)  12/30/22 184 lb (83.5 kg)  12/08/22 183 lb (83 kg)    Medications:  Allergies as of 01/07/2023   No Known Allergies      Medication List        Accurate as of January 07, 2023  2:59 PM. If you have any questions, ask your nurse or doctor.          amLODipine 10 MG tablet Commonly known as: NORVASC Take 10 mg by mouth daily.   atenolol 25 MG tablet Commonly known as: TENORMIN Take 12.5 mg by mouth 2 (two) times daily.   hydrocortisone cream 1 % Apply topically 2 (two) times daily.   latanoprost 0.005 % ophthalmic solution Commonly known as: XALATAN Place 1 drop into both eyes 2 (two) times daily.   losartan-hydrochlorothiazide 100-12.5 MG tablet Commonly known as: HYZAAR Take 0.5 tablets by mouth daily. 1/2 tablet in the morning, 1/2 tablet at night time.   multivitamin with minerals Tabs tablet Take 1 tablet by mouth daily.   pantoprazole 40 MG tablet Commonly known as: PROTONIX Take 40 mg by mouth 2 (two) times  daily.   rosuvastatin 20 MG tablet Commonly known as: CRESTOR Take 20 mg by mouth daily.   tamsulosin 0.4 MG Caps capsule Commonly known as: FLOMAX Take 0.4 mg by mouth at bedtime.        Allergies: No Known Allergies  Past Medical History, Surgical history, Social history, and Family History were reviewed and updated.  Review of Systems: All other 10 point review of systems is negative.   Physical Exam:  height is 5\' 9"  (1.753 m) and weight is 186 lb 9.6 oz (84.6 kg). His oral temperature is 98.9 F (37.2 C). His blood pressure is 141/74 (abnormal) and his pulse is 57 (abnormal). His respiration is 19 and oxygen saturation is 100%.   Wt Readings from Last 3 Encounters:  01/07/23 186 lb 9.6 oz (84.6 kg)  12/30/22 184 lb (83.5 kg)  12/08/22 183 lb (83 kg)    Physical Exam Vitals reviewed.  HENT:     Head: Normocephalic and atraumatic.  Eyes:     Pupils: Pupils are equal, round, and reactive to light.  Cardiovascular:     Rate and Rhythm: Normal rate and regular rhythm.     Heart sounds: Normal heart sounds.  Pulmonary:     Effort: Pulmonary effort is normal.  Breath sounds: Normal breath sounds.  Abdominal:     General: Bowel sounds are normal.     Palpations: Abdomen is soft.  Musculoskeletal:        General: No tenderness or deformity. Normal range of motion.     Cervical back: Normal range of motion.  Lymphadenopathy:     Cervical: No cervical adenopathy.  Skin:    General: Skin is warm and dry.     Findings: No erythema or rash.     Comments: There is a resolving area of eccyhmosis of the right mid lower forearm with a palpable small soft semi mobile non-tender mass along the track of the vein. No edema or erythema of arm  Neurological:     Mental Status: He is alert and oriented to person, place, and time.  Psychiatric:        Behavior: Behavior normal.        Thought Content: Thought content normal.        Judgment: Judgment normal.      Lab  Results  Component Value Date   WBC 6.9 01/07/2023   HGB 11.4 (L) 01/07/2023   HCT 33.5 (L) 01/07/2023   MCV 93.8 01/07/2023   PLT 59 (L) 01/07/2023   Lab Results  Component Value Date   FERRITIN 221 10/12/2022   IRON 66 10/12/2022   TIBC 337 10/12/2022   UIBC 271 10/12/2022   IRONPCTSAT 20 10/12/2022   Lab Results  Component Value Date   RETICCTPCT 4.2 (H) 10/12/2022   RBC 3.57 (L) 01/07/2023   No results found for: "KPAFRELGTCHN", "LAMBDASER", "KAPLAMBRATIO" No results found for: "IGGSERUM", "IGA", "IGMSERUM" No results found for: "TOTALPROTELP", "ALBUMINELP", "A1GS", "A2GS", "BETS", "BETA2SER", "GAMS", "MSPIKE", "SPEI"   Chemistry      Component Value Date/Time   NA 137 01/07/2023 1407   NA 140 05/11/2019 0946   K 4.2 01/07/2023 1407   CL 101 01/07/2023 1407   CO2 28 01/07/2023 1407   BUN 24 (H) 01/07/2023 1407   BUN 17 05/11/2019 0946   CREATININE 1.53 (H) 01/07/2023 1407   CREATININE 0.75 04/10/2016 1132      Component Value Date/Time   CALCIUM 9.5 01/07/2023 1407   ALKPHOS 40 01/07/2023 1407   AST 17 01/07/2023 1407   ALT 9 01/07/2023 1407   BILITOT 0.8 01/07/2023 1407     Encounter Diagnosis  Name Primary?   Right arm pain Yes   Impression and Plan: Lonnie Hansen is a very pleasant 72 yo African American gentleman with chronic ITP.    Currently Hgb is 11.4 which is roughly stable from 1 month ago (12.2) Platelets are 59 today- He will need Nplate.   Nplate today RTC 1 month APP, labs ( CBC w/, CMP, iron, ferritin)  Rushie Chestnut, PA-C 12/12/20242:59 PM Get

## 2023-02-08 ENCOUNTER — Inpatient Hospital Stay (HOSPITAL_BASED_OUTPATIENT_CLINIC_OR_DEPARTMENT_OTHER): Payer: Medicare Other | Admitting: Family

## 2023-02-08 ENCOUNTER — Inpatient Hospital Stay: Payer: Medicare Other | Attending: Hematology & Oncology

## 2023-02-08 ENCOUNTER — Encounter: Payer: Self-pay | Admitting: Family

## 2023-02-08 ENCOUNTER — Inpatient Hospital Stay: Payer: Medicare Other

## 2023-02-08 VITALS — BP 138/59 | HR 56 | Temp 98.9°F | Resp 19 | Ht 69.0 in | Wt 192.0 lb

## 2023-02-08 DIAGNOSIS — Z79899 Other long term (current) drug therapy: Secondary | ICD-10-CM | POA: Insufficient documentation

## 2023-02-08 DIAGNOSIS — D693 Immune thrombocytopenic purpura: Secondary | ICD-10-CM

## 2023-02-08 DIAGNOSIS — D509 Iron deficiency anemia, unspecified: Secondary | ICD-10-CM | POA: Diagnosis not present

## 2023-02-08 LAB — CMP (CANCER CENTER ONLY)
ALT: 9 U/L (ref 0–44)
AST: 19 U/L (ref 15–41)
Albumin: 4.7 g/dL (ref 3.5–5.0)
Alkaline Phosphatase: 37 U/L — ABNORMAL LOW (ref 38–126)
Anion gap: 10 (ref 5–15)
BUN: 20 mg/dL (ref 8–23)
CO2: 28 mmol/L (ref 22–32)
Calcium: 9.7 mg/dL (ref 8.9–10.3)
Chloride: 98 mmol/L (ref 98–111)
Creatinine: 1.52 mg/dL — ABNORMAL HIGH (ref 0.61–1.24)
GFR, Estimated: 48 mL/min — ABNORMAL LOW (ref >60)
Glucose, Bld: 105 mg/dL — ABNORMAL HIGH (ref 70–99)
Potassium: 3.8 mmol/L (ref 3.5–5.1)
Sodium: 136 mmol/L (ref 135–145)
Total Bilirubin: 0.9 mg/dL (ref 0.0–1.2)
Total Protein: 7.7 g/dL (ref 6.5–8.1)

## 2023-02-08 LAB — CBC
HCT: 33.3 % — ABNORMAL LOW (ref 39.0–52.0)
Hemoglobin: 11.4 g/dL — ABNORMAL LOW (ref 13.0–17.0)
MCH: 32.1 pg (ref 26.0–34.0)
MCHC: 34.2 g/dL (ref 30.0–36.0)
MCV: 93.8 fL (ref 80.0–100.0)
Platelets: 58 10*3/uL — ABNORMAL LOW (ref 150–400)
RBC: 3.55 MIL/uL — ABNORMAL LOW (ref 4.22–5.81)
RDW: 12.9 % (ref 11.5–15.5)
WBC: 6.4 10*3/uL (ref 4.0–10.5)
nRBC: 0 % (ref 0.0–0.2)

## 2023-02-08 LAB — FERRITIN: Ferritin: 83 ng/mL (ref 24–336)

## 2023-02-08 MED ORDER — ROMIPLOSTIM 125 MCG ~~LOC~~ SOLR
95.0000 ug | Freq: Once | SUBCUTANEOUS | Status: AC
  Administered 2023-02-08: 95 ug via SUBCUTANEOUS
  Filled 2023-02-08: qty 0.19

## 2023-02-08 NOTE — Progress Notes (Signed)
 Hematology and Oncology Follow Up Visit  Lonnie Hansen [id number] 06/25/1950 73 y.o. 02/08/2023   Principle Diagnosis:  Chronic ITP   Current Therapy:        Nplate  to keep platelet count over 100,000   Interim History:  Lonnie Hansen is here today for follow-up and Nplate . Overall, he is doing well.  He has not noted any blood loss. No bruising or petechiae.  Platelets are 58, Hgb 11.4, MCV 93, WBC count 6.4.  No fever, n/v, cough, rash, dizziness, SOB, chest pain, palpitations, abdominal pain or changes in bowel or bladder habits.  He notes that his nose feels cold at times. No discoloration noted. No issues with pain or changes in smell. He dropped his cell phone and hit the right ankle. There is minimal puffiness on the outer ankle bone. No bruising, redness or edema noted.  No numbness or tingling in his extremities.  No falls or syncope reported.  Appetite and hydration are good. Weight is stable at 192 lbs.   ECOG Performance Status: 1 - Symptomatic but completely ambulatory  Medications:  Allergies as of 02/08/2023   No Known Allergies      Medication List        Accurate as of February 08, 2023  1:53 PM. If you have any questions, ask your nurse or doctor.          amLODipine  10 MG tablet Commonly known as: NORVASC  Take 10 mg by mouth daily.   atenolol  25 MG tablet Commonly known as: TENORMIN  Take 12.5 mg by mouth 2 (two) times daily.   hydrocortisone  cream 1 % Apply topically 2 (two) times daily.   latanoprost  0.005 % ophthalmic solution Commonly known as: XALATAN  Place 1 drop into both eyes 2 (two) times daily.   losartan -hydrochlorothiazide  100-12.5 MG tablet Commonly known as: HYZAAR Take 0.5 tablets by mouth daily. 1/2 tablet in the morning, 1/2 tablet at night time.   multivitamin with minerals Tabs tablet Take 1 tablet by mouth daily.   pantoprazole  40 MG tablet Commonly known as: PROTONIX  Take 40 mg by mouth 2 (two) times daily.    rosuvastatin  20 MG tablet Commonly known as: CRESTOR  Take 20 mg by mouth daily.   tamsulosin 0.4 MG Caps capsule Commonly known as: FLOMAX Take 0.4 mg by mouth at bedtime.        Allergies: No Known Allergies  Past Medical History, Surgical history, Social history, and Family History were reviewed and updated.  Review of Systems: All other 10 point review of systems is negative.   Physical Exam:  vitals were not taken for this visit.   Wt Readings from Last 3 Encounters:  01/07/23 186 lb 9.6 oz (84.6 kg)  12/30/22 184 lb (83.5 kg)  12/08/22 183 lb (83 kg)    Ocular: Sclerae unicteric, pupils equal, round and reactive to light Ear-nose-throat: Oropharynx clear, dentition fair Lymphatic: No cervical or supraclavicular adenopathy Lungs no rales or rhonchi, good excursion bilaterally Heart regular rate and rhythm, no murmur appreciated Abd soft, nontender, positive bowel sounds MSK no focal spinal tenderness, no joint edema Neuro: non-focal, well-oriented, appropriate affect Breasts: Deferred   Lab Results  Component Value Date   WBC 6.4 02/08/2023   HGB 11.4 (L) 02/08/2023   HCT 33.3 (L) 02/08/2023   MCV 93.8 02/08/2023   PLT 58 (L) 02/08/2023   Lab Results  Component Value Date   FERRITIN 221 10/12/2022   IRON  66 10/12/2022   TIBC 337 10/12/2022   UIBC  271 10/12/2022   IRONPCTSAT 20 10/12/2022   Lab Results  Component Value Date   RETICCTPCT 4.2 (H) 10/12/2022   RBC 3.55 (L) 02/08/2023   No results found for: KPAFRELGTCHN, LAMBDASER, KAPLAMBRATIO No results found for: IGGSERUM, IGA, IGMSERUM No results found for: STEPHANY CARLOTA BENSON MARKEL EARLA JOANNIE DOC VICK, SPEI   Chemistry      Component Value Date/Time   NA 137 01/07/2023 1407   NA 140 05/11/2019 0946   K 4.2 01/07/2023 1407   CL 101 01/07/2023 1407   CO2 28 01/07/2023 1407   BUN 24 (H) 01/07/2023 1407   BUN 17 05/11/2019 0946   CREATININE  1.53 (H) 01/07/2023 1407   CREATININE 0.75 04/10/2016 1132      Component Value Date/Time   CALCIUM  9.5 01/07/2023 1407   ALKPHOS 40 01/07/2023 1407   AST 17 01/07/2023 1407   ALT 9 01/07/2023 1407   BILITOT 0.8 01/07/2023 1407       Impression and Plan: Lonnie Hansen is a very pleasant 73 yo African American gentleman with chronic ITP.    Nplate  given today for platelets 58.  Iron  studies are pending.  Follow-up in 4 weeks.   Lauraine Pepper, NP 1/13/20251:53 PM

## 2023-02-08 NOTE — Patient Instructions (Signed)
 Romiplostim Injection What is this medication? ROMIPLOSTIM (roe mi PLOE stim) treats low levels of platelets in your body caused by immune thrombocytopenia (ITP). It is prescribed when other medications have not worked or cannot be tolerated. It may also be used to help people who have been exposed to high doses of radiation. It works by increasing the amount of platelets in your blood. This lowers the risk of bleeding. This medicine may be used for other purposes; ask your health care provider or pharmacist if you have questions. COMMON BRAND NAME(S): Nplate What should I tell my care team before I take this medication? They need to know if you have any of these conditions: Blood clots Myelodysplastic syndrome An unusual or allergic reaction to romiplostim, mannitol, other medications, foods, dyes, or preservatives Pregnant or trying to get pregnant Breast-feeding How should I use this medication? This medication is injected under the skin. It is given by a care team in a hospital or clinic setting. A special MedGuide will be given to you before each treatment. Be sure to read this information carefully each time. Talk to your care team about the use of this medication in children. While it may be prescribed for children as young as newborns for selected conditions, precautions do apply. Overdosage: If you think you have taken too much of this medicine contact a poison control center or emergency room at once. NOTE: This medicine is only for you. Do not share this medicine with others. What if I miss a dose? Keep appointments for follow-up doses. It is important not to miss your dose. Call your care team if you are unable to keep an appointment. What may interact with this medication? Interactions are not expected. This list may not describe all possible interactions. Give your health care provider a list of all the medicines, herbs, non-prescription drugs, or dietary supplements you use. Also  tell them if you smoke, drink alcohol, or use illegal drugs. Some items may interact with your medicine. What should I watch for while using this medication? Visit your care team for regular checks on your progress. You may need blood work done while you are taking this medication. Your condition will be monitored carefully while you are receiving this medication. It is important not to miss any appointments. What side effects may I notice from receiving this medication? Side effects that you should report to your care team as soon as possible: Allergic reactions--skin rash, itching, hives, swelling of the face, lips, tongue, or throat Blood clot--pain, swelling, or warmth in the leg, shortness of breath, chest pain Side effects that usually do not require medical attention (report to your care team if they continue or are bothersome): Dizziness Joint pain Muscle pain Pain in the hands or feet Stomach pain Trouble sleeping This list may not describe all possible side effects. Call your doctor for medical advice about side effects. You may report side effects to FDA at 1-800-FDA-1088. Where should I keep my medication? This medication is given in a hospital or clinic. It will not be stored at home. NOTE: This sheet is a summary. It may not cover all possible information. If you have questions about this medicine, talk to your doctor, pharmacist, or health care provider.  2024 Elsevier/Gold Standard (2021-05-19 00:00:00)

## 2023-02-09 LAB — IRON AND IRON BINDING CAPACITY (CC-WL,HP ONLY)
Iron: 112 ug/dL (ref 45–182)
Saturation Ratios: 31 % (ref 17.9–39.5)
TIBC: 360 ug/dL (ref 250–450)
UIBC: 248 ug/dL (ref 117–376)

## 2023-03-08 ENCOUNTER — Inpatient Hospital Stay: Payer: Medicare Other | Attending: Hematology & Oncology

## 2023-03-08 ENCOUNTER — Inpatient Hospital Stay: Payer: Medicare Other

## 2023-03-08 ENCOUNTER — Inpatient Hospital Stay (HOSPITAL_BASED_OUTPATIENT_CLINIC_OR_DEPARTMENT_OTHER): Payer: Medicare Other | Admitting: Hematology & Oncology

## 2023-03-08 ENCOUNTER — Encounter: Payer: Self-pay | Admitting: Hematology & Oncology

## 2023-03-08 VITALS — BP 133/70 | HR 62 | Temp 98.3°F | Resp 20 | Ht 69.0 in | Wt 188.0 lb

## 2023-03-08 DIAGNOSIS — Z79899 Other long term (current) drug therapy: Secondary | ICD-10-CM | POA: Insufficient documentation

## 2023-03-08 DIAGNOSIS — D693 Immune thrombocytopenic purpura: Secondary | ICD-10-CM

## 2023-03-08 DIAGNOSIS — D509 Iron deficiency anemia, unspecified: Secondary | ICD-10-CM

## 2023-03-08 LAB — CMP (CANCER CENTER ONLY)
ALT: 8 U/L (ref 0–44)
AST: 20 U/L (ref 15–41)
Albumin: 4.6 g/dL (ref 3.5–5.0)
Alkaline Phosphatase: 36 U/L — ABNORMAL LOW (ref 38–126)
Anion gap: 10 (ref 5–15)
BUN: 22 mg/dL (ref 8–23)
CO2: 30 mmol/L (ref 22–32)
Calcium: 9.8 mg/dL (ref 8.9–10.3)
Chloride: 101 mmol/L (ref 98–111)
Creatinine: 1.68 mg/dL — ABNORMAL HIGH (ref 0.61–1.24)
GFR, Estimated: 43 mL/min — ABNORMAL LOW (ref >60)
Glucose, Bld: 102 mg/dL — ABNORMAL HIGH (ref 70–99)
Potassium: 3.9 mmol/L (ref 3.5–5.1)
Sodium: 141 mmol/L (ref 135–145)
Total Bilirubin: 0.9 mg/dL (ref 0.0–1.2)
Total Protein: 7.5 g/dL (ref 6.5–8.1)

## 2023-03-08 LAB — CBC WITH DIFFERENTIAL (CANCER CENTER ONLY)
Abs Immature Granulocytes: 0.07 10*3/uL (ref 0.00–0.07)
Basophils Absolute: 0 10*3/uL (ref 0.0–0.1)
Basophils Relative: 0 %
Eosinophils Absolute: 0.2 10*3/uL (ref 0.0–0.5)
Eosinophils Relative: 3 %
HCT: 33.3 % — ABNORMAL LOW (ref 39.0–52.0)
Hemoglobin: 11.1 g/dL — ABNORMAL LOW (ref 13.0–17.0)
Immature Granulocytes: 1 %
Lymphocytes Relative: 16 %
Lymphs Abs: 1 10*3/uL (ref 0.7–4.0)
MCH: 31.7 pg (ref 26.0–34.0)
MCHC: 33.3 g/dL (ref 30.0–36.0)
MCV: 95.1 fL (ref 80.0–100.0)
Monocytes Absolute: 0.5 10*3/uL (ref 0.1–1.0)
Monocytes Relative: 8 %
Neutro Abs: 4.4 10*3/uL (ref 1.7–7.7)
Neutrophils Relative %: 72 %
Platelet Count: 53 10*3/uL — ABNORMAL LOW (ref 150–400)
RBC: 3.5 MIL/uL — ABNORMAL LOW (ref 4.22–5.81)
RDW: 13.1 % (ref 11.5–15.5)
WBC Count: 6.1 10*3/uL (ref 4.0–10.5)
nRBC: 0 % (ref 0.0–0.2)

## 2023-03-08 LAB — FERRITIN: Ferritin: 76 ng/mL (ref 24–336)

## 2023-03-08 MED ORDER — ROMIPLOSTIM 125 MCG ~~LOC~~ SOLR
95.0000 ug | Freq: Once | SUBCUTANEOUS | Status: AC
  Administered 2023-03-08: 95 ug via SUBCUTANEOUS
  Filled 2023-03-08: qty 0.19

## 2023-03-08 NOTE — Patient Instructions (Signed)
 Romiplostim Injection What is this medication? ROMIPLOSTIM (roe mi PLOE stim) treats low levels of platelets in your body caused by immune thrombocytopenia (ITP). It is prescribed when other medications have not worked or cannot be tolerated. It may also be used to help people who have been exposed to high doses of radiation. It works by increasing the amount of platelets in your blood. This lowers the risk of bleeding. This medicine may be used for other purposes; ask your health care provider or pharmacist if you have questions. COMMON BRAND NAME(S): Nplate What should I tell my care team before I take this medication? They need to know if you have any of these conditions: Blood clots Myelodysplastic syndrome An unusual or allergic reaction to romiplostim, mannitol, other medications, foods, dyes, or preservatives Pregnant or trying to get pregnant Breast-feeding How should I use this medication? This medication is injected under the skin. It is given by a care team in a hospital or clinic setting. A special MedGuide will be given to you before each treatment. Be sure to read this information carefully each time. Talk to your care team about the use of this medication in children. While it may be prescribed for children as young as newborns for selected conditions, precautions do apply. Overdosage: If you think you have taken too much of this medicine contact a poison control center or emergency room at once. NOTE: This medicine is only for you. Do not share this medicine with others. What if I miss a dose? Keep appointments for follow-up doses. It is important not to miss your dose. Call your care team if you are unable to keep an appointment. What may interact with this medication? Interactions are not expected. This list may not describe all possible interactions. Give your health care provider a list of all the medicines, herbs, non-prescription drugs, or dietary supplements you use. Also  tell them if you smoke, drink alcohol, or use illegal drugs. Some items may interact with your medicine. What should I watch for while using this medication? Visit your care team for regular checks on your progress. You may need blood work done while you are taking this medication. Your condition will be monitored carefully while you are receiving this medication. It is important not to miss any appointments. What side effects may I notice from receiving this medication? Side effects that you should report to your care team as soon as possible: Allergic reactions--skin rash, itching, hives, swelling of the face, lips, tongue, or throat Blood clot--pain, swelling, or warmth in the leg, shortness of breath, chest pain Side effects that usually do not require medical attention (report to your care team if they continue or are bothersome): Dizziness Joint pain Muscle pain Pain in the hands or feet Stomach pain Trouble sleeping This list may not describe all possible side effects. Call your doctor for medical advice about side effects. You may report side effects to FDA at 1-800-FDA-1088. Where should I keep my medication? This medication is given in a hospital or clinic. It will not be stored at home. NOTE: This sheet is a summary. It may not cover all possible information. If you have questions about this medicine, talk to your doctor, pharmacist, or health care provider.  2024 Elsevier/Gold Standard (2021-05-19 00:00:00)

## 2023-03-08 NOTE — Progress Notes (Signed)
 Hematology and Oncology Follow Up Visit  Lonnie Hansen [id number] Dec 20, 1950 73 y.o. 03/08/2023   Principle Diagnosis:  Chronic ITP   Current Therapy:        Nplate  to keep platelet count over 100,000   Interim History:  Lonnie Hansen is here today for follow-up and Nplate .  We last saw him back in January.  He has been doing well.  He really has had no complaints.  There is been no problems with bleeding.  He has had no bruising.  He has had no change in bowel or bladder habits.  His appetite is doing all right.  He has had no issues with cough or shortness of breath.  He has had no problems with COVID.  There is still question about him having surgery I think for his hip.  He wants to try to hold off on this as long as possible.  Overall, I would say that his performance status is probably ECOG 1.   Medications:  Allergies as of 03/08/2023   No Known Allergies      Medication List        Accurate as of March 08, 2023  1:55 PM. If you have any questions, ask your nurse or doctor.          amLODipine  10 MG tablet Commonly known as: NORVASC  Take 10 mg by mouth daily.   atenolol  25 MG tablet Commonly known as: TENORMIN  Take 12.5 mg by mouth 2 (two) times daily.   hydrocortisone  cream 1 % Apply topically 2 (two) times daily.   latanoprost  0.005 % ophthalmic solution Commonly known as: XALATAN  Place 1 drop into both eyes 2 (two) times daily.   losartan -hydrochlorothiazide  100-12.5 MG tablet Commonly known as: HYZAAR Take 0.5 tablets by mouth daily. 1/2 tablet in the morning, 1/2 tablet at night time.   multivitamin with minerals Tabs tablet Take 1 tablet by mouth daily.   pantoprazole  40 MG tablet Commonly known as: PROTONIX  Take 40 mg by mouth 2 (two) times daily.   rosuvastatin  20 MG tablet Commonly known as: CRESTOR  Take 20 mg by mouth daily.   tamsulosin 0.4 MG Caps capsule Commonly known as: FLOMAX Take 0.4 mg by mouth at bedtime.         Allergies: No Known Allergies  Past Medical History, Surgical history, Social history, and Family History were reviewed and updated.  Review of Systems: Review of Systems  Constitutional: Negative.   HENT: Negative.    Eyes: Negative.   Respiratory: Negative.    Cardiovascular: Negative.   Gastrointestinal: Negative.   Genitourinary: Negative.   Musculoskeletal: Negative.   Skin: Negative.   Neurological: Negative.   Endo/Heme/Allergies: Negative.   Psychiatric/Behavioral: Negative.       Physical Exam:  Temperature is 98.3.  Pulse 62.  Blood pressure 133/70.  Weight is 188 pounds.  Wt Readings from Last 3 Encounters:  02/08/23 192 lb (87.1 kg)  01/07/23 186 lb 9.6 oz (84.6 kg)  12/30/22 184 lb (83.5 kg)    Physical Exam Vitals reviewed.  HENT:     Head: Normocephalic and atraumatic.  Eyes:     Pupils: Pupils are equal, round, and reactive to light.  Cardiovascular:     Rate and Rhythm: Normal rate and regular rhythm.     Heart sounds: Normal heart sounds.  Pulmonary:     Effort: Pulmonary effort is normal.     Breath sounds: Normal breath sounds.  Abdominal:     General: Bowel sounds are  normal.     Palpations: Abdomen is soft.  Musculoskeletal:        General: No tenderness or deformity. Normal range of motion.     Cervical back: Normal range of motion.  Lymphadenopathy:     Cervical: No cervical adenopathy.  Skin:    General: Skin is warm and dry.     Findings: No erythema or rash.  Neurological:     Mental Status: He is alert and oriented to person, place, and time.  Psychiatric:        Behavior: Behavior normal.        Thought Content: Thought content normal.        Judgment: Judgment normal.       Lab Results  Component Value Date   WBC 6.1 03/08/2023   HGB 11.1 (L) 03/08/2023   HCT 33.3 (L) 03/08/2023   MCV 95.1 03/08/2023   PLT 53 (L) 03/08/2023   Lab Results  Component Value Date   FERRITIN 83 02/08/2023   IRON  112 02/08/2023    TIBC 360 02/08/2023   UIBC 248 02/08/2023   IRONPCTSAT 31 02/08/2023   Lab Results  Component Value Date   RETICCTPCT 4.2 (H) 10/12/2022   RBC 3.50 (L) 03/08/2023   No results found for: "KPAFRELGTCHN", "LAMBDASER", "KAPLAMBRATIO" No results found for: "IGGSERUM", "IGA", "IGMSERUM" No results found for: "TOTALPROTELP", "ALBUMINELP", "A1GS", "A2GS", "BETS", "BETA2SER", "GAMS", "MSPIKE", "SPEI"   Chemistry      Component Value Date/Time   NA 136 02/08/2023 1327   NA 140 05/11/2019 0946   K 3.8 02/08/2023 1327   CL 98 02/08/2023 1327   CO2 28 02/08/2023 1327   BUN 20 02/08/2023 1327   BUN 17 05/11/2019 0946   CREATININE 1.52 (H) 02/08/2023 1327   CREATININE 0.75 04/10/2016 1132      Component Value Date/Time   CALCIUM  9.7 02/08/2023 1327   ALKPHOS 37 (L) 02/08/2023 1327   AST 19 02/08/2023 1327   ALT 9 02/08/2023 1327   BILITOT 0.9 02/08/2023 1327       Impression and Plan: Lonnie Hansen is a very pleasant 73 yo African American gentleman with chronic ITP.  His platelet count as well as on the lower side.  However, it is stable.  He is asymptomatic.  If, and when, he does need to have surgery, we will certainly be much more aggressive with his interventions to get his platelet count back up.  We will plan to get him back in another 4 weeks.   Ivor Mars, MD 2/10/20251:55 PM

## 2023-03-09 LAB — IRON AND IRON BINDING CAPACITY (CC-WL,HP ONLY)
Iron: 90 ug/dL (ref 45–182)
Saturation Ratios: 26 % (ref 17.9–39.5)
TIBC: 344 ug/dL (ref 250–450)
UIBC: 254 ug/dL (ref 117–376)

## 2023-03-16 DIAGNOSIS — H401231 Low-tension glaucoma, bilateral, mild stage: Secondary | ICD-10-CM | POA: Diagnosis not present

## 2023-03-23 DIAGNOSIS — I2510 Atherosclerotic heart disease of native coronary artery without angina pectoris: Secondary | ICD-10-CM | POA: Diagnosis not present

## 2023-03-23 DIAGNOSIS — E785 Hyperlipidemia, unspecified: Secondary | ICD-10-CM | POA: Diagnosis not present

## 2023-03-23 DIAGNOSIS — Z1212 Encounter for screening for malignant neoplasm of rectum: Secondary | ICD-10-CM | POA: Diagnosis not present

## 2023-03-23 DIAGNOSIS — I10 Essential (primary) hypertension: Secondary | ICD-10-CM | POA: Diagnosis not present

## 2023-03-23 DIAGNOSIS — Z125 Encounter for screening for malignant neoplasm of prostate: Secondary | ICD-10-CM | POA: Diagnosis not present

## 2023-03-30 DIAGNOSIS — R82998 Other abnormal findings in urine: Secondary | ICD-10-CM | POA: Diagnosis not present

## 2023-03-30 DIAGNOSIS — Z1212 Encounter for screening for malignant neoplasm of rectum: Secondary | ICD-10-CM | POA: Diagnosis not present

## 2023-03-30 DIAGNOSIS — I2510 Atherosclerotic heart disease of native coronary artery without angina pectoris: Secondary | ICD-10-CM | POA: Diagnosis not present

## 2023-03-30 DIAGNOSIS — I129 Hypertensive chronic kidney disease with stage 1 through stage 4 chronic kidney disease, or unspecified chronic kidney disease: Secondary | ICD-10-CM | POA: Diagnosis not present

## 2023-03-30 DIAGNOSIS — N1831 Chronic kidney disease, stage 3a: Secondary | ICD-10-CM | POA: Diagnosis not present

## 2023-03-30 DIAGNOSIS — M25551 Pain in right hip: Secondary | ICD-10-CM | POA: Diagnosis not present

## 2023-03-30 DIAGNOSIS — Z0000 Encounter for general adult medical examination without abnormal findings: Secondary | ICD-10-CM | POA: Diagnosis not present

## 2023-03-30 DIAGNOSIS — E785 Hyperlipidemia, unspecified: Secondary | ICD-10-CM | POA: Diagnosis not present

## 2023-03-30 DIAGNOSIS — D693 Immune thrombocytopenic purpura: Secondary | ICD-10-CM | POA: Diagnosis not present

## 2023-03-30 DIAGNOSIS — G4733 Obstructive sleep apnea (adult) (pediatric): Secondary | ICD-10-CM | POA: Diagnosis not present

## 2023-04-05 ENCOUNTER — Inpatient Hospital Stay: Payer: Medicare Other

## 2023-04-05 ENCOUNTER — Inpatient Hospital Stay: Payer: Medicare Other | Admitting: Medical Oncology

## 2023-04-05 ENCOUNTER — Encounter: Payer: Self-pay | Admitting: Medical Oncology

## 2023-04-05 ENCOUNTER — Inpatient Hospital Stay (HOSPITAL_BASED_OUTPATIENT_CLINIC_OR_DEPARTMENT_OTHER): Admitting: Medical Oncology

## 2023-04-05 ENCOUNTER — Inpatient Hospital Stay: Attending: Hematology & Oncology

## 2023-04-05 ENCOUNTER — Inpatient Hospital Stay

## 2023-04-05 VITALS — BP 132/74 | HR 58 | Temp 98.5°F | Resp 18 | Ht 69.0 in | Wt 188.3 lb

## 2023-04-05 DIAGNOSIS — Z79899 Other long term (current) drug therapy: Secondary | ICD-10-CM | POA: Insufficient documentation

## 2023-04-05 DIAGNOSIS — D693 Immune thrombocytopenic purpura: Secondary | ICD-10-CM | POA: Diagnosis present

## 2023-04-05 DIAGNOSIS — R351 Nocturia: Secondary | ICD-10-CM | POA: Diagnosis not present

## 2023-04-05 DIAGNOSIS — D509 Iron deficiency anemia, unspecified: Secondary | ICD-10-CM | POA: Insufficient documentation

## 2023-04-05 LAB — CBC WITH DIFFERENTIAL (CANCER CENTER ONLY)
Abs Immature Granulocytes: 0.05 10*3/uL (ref 0.00–0.07)
Basophils Absolute: 0 10*3/uL (ref 0.0–0.1)
Basophils Relative: 0 %
Eosinophils Absolute: 0.2 10*3/uL (ref 0.0–0.5)
Eosinophils Relative: 3 %
HCT: 33.2 % — ABNORMAL LOW (ref 39.0–52.0)
Hemoglobin: 11.3 g/dL — ABNORMAL LOW (ref 13.0–17.0)
Immature Granulocytes: 1 %
Lymphocytes Relative: 15 %
Lymphs Abs: 0.9 10*3/uL (ref 0.7–4.0)
MCH: 31.6 pg (ref 26.0–34.0)
MCHC: 34 g/dL (ref 30.0–36.0)
MCV: 92.7 fL (ref 80.0–100.0)
Monocytes Absolute: 0.6 10*3/uL (ref 0.1–1.0)
Monocytes Relative: 10 %
Neutro Abs: 4.4 10*3/uL (ref 1.7–7.7)
Neutrophils Relative %: 71 %
Platelet Count: 56 10*3/uL — ABNORMAL LOW (ref 150–400)
RBC: 3.58 MIL/uL — ABNORMAL LOW (ref 4.22–5.81)
RDW: 12.7 % (ref 11.5–15.5)
WBC Count: 6.2 10*3/uL (ref 4.0–10.5)
nRBC: 0 % (ref 0.0–0.2)

## 2023-04-05 LAB — CMP (CANCER CENTER ONLY)
ALT: 9 U/L (ref 0–44)
AST: 18 U/L (ref 15–41)
Albumin: 4.7 g/dL (ref 3.5–5.0)
Alkaline Phosphatase: 39 U/L (ref 38–126)
Anion gap: 11 (ref 5–15)
BUN: 17 mg/dL (ref 8–23)
CO2: 26 mmol/L (ref 22–32)
Calcium: 9.4 mg/dL (ref 8.9–10.3)
Chloride: 102 mmol/L (ref 98–111)
Creatinine: 1.45 mg/dL — ABNORMAL HIGH (ref 0.61–1.24)
GFR, Estimated: 51 mL/min — ABNORMAL LOW (ref >60)
Glucose, Bld: 88 mg/dL (ref 70–99)
Potassium: 3.6 mmol/L (ref 3.5–5.1)
Sodium: 139 mmol/L (ref 135–145)
Total Bilirubin: 0.7 mg/dL (ref 0.0–1.2)
Total Protein: 7.6 g/dL (ref 6.5–8.1)

## 2023-04-05 LAB — FERRITIN: Ferritin: 68 ng/mL (ref 24–336)

## 2023-04-05 LAB — SAVE SMEAR(SSMR), FOR PROVIDER SLIDE REVIEW

## 2023-04-05 MED ORDER — ROMIPLOSTIM 125 MCG ~~LOC~~ SOLR
95.0000 ug | Freq: Once | SUBCUTANEOUS | Status: AC
  Administered 2023-04-05: 95 ug via SUBCUTANEOUS
  Filled 2023-04-05: qty 0.19

## 2023-04-05 NOTE — Patient Instructions (Signed)
 Romiplostim Injection What is this medication? ROMIPLOSTIM (roe mi PLOE stim) treats low levels of platelets in your body caused by immune thrombocytopenia (ITP). It is prescribed when other medications have not worked or cannot be tolerated. It may also be used to help people who have been exposed to high doses of radiation. It works by increasing the amount of platelets in your blood. This lowers the risk of bleeding. This medicine may be used for other purposes; ask your health care provider or pharmacist if you have questions. COMMON BRAND NAME(S): Nplate What should I tell my care team before I take this medication? They need to know if you have any of these conditions: Blood clots Myelodysplastic syndrome An unusual or allergic reaction to romiplostim, mannitol, other medications, foods, dyes, or preservatives Pregnant or trying to get pregnant Breast-feeding How should I use this medication? This medication is injected under the skin. It is given by a care team in a hospital or clinic setting. A special MedGuide will be given to you before each treatment. Be sure to read this information carefully each time. Talk to your care team about the use of this medication in children. While it may be prescribed for children as young as newborns for selected conditions, precautions do apply. Overdosage: If you think you have taken too much of this medicine contact a poison control center or emergency room at once. NOTE: This medicine is only for you. Do not share this medicine with others. What if I miss a dose? Keep appointments for follow-up doses. It is important not to miss your dose. Call your care team if you are unable to keep an appointment. What may interact with this medication? Interactions are not expected. This list may not describe all possible interactions. Give your health care provider a list of all the medicines, herbs, non-prescription drugs, or dietary supplements you use. Also  tell them if you smoke, drink alcohol, or use illegal drugs. Some items may interact with your medicine. What should I watch for while using this medication? Visit your care team for regular checks on your progress. You may need blood work done while you are taking this medication. Your condition will be monitored carefully while you are receiving this medication. It is important not to miss any appointments. What side effects may I notice from receiving this medication? Side effects that you should report to your care team as soon as possible: Allergic reactions--skin rash, itching, hives, swelling of the face, lips, tongue, or throat Blood clot--pain, swelling, or warmth in the leg, shortness of breath, chest pain Side effects that usually do not require medical attention (report to your care team if they continue or are bothersome): Dizziness Joint pain Muscle pain Pain in the hands or feet Stomach pain Trouble sleeping This list may not describe all possible side effects. Call your doctor for medical advice about side effects. You may report side effects to FDA at 1-800-FDA-1088. Where should I keep my medication? This medication is given in a hospital or clinic. It will not be stored at home. NOTE: This sheet is a summary. It may not cover all possible information. If you have questions about this medicine, talk to your doctor, pharmacist, or health care provider.  2024 Elsevier/Gold Standard (2021-05-19 00:00:00)

## 2023-04-05 NOTE — Progress Notes (Signed)
 Hematology and Oncology Follow Up Visit  Lonnie Hansen [id number] 1950-07-21 73 y.o. 04/05/2023   Principle Diagnosis:  Chronic ITP   Current Therapy:        Nplate to keep platelet count over 100,000   Interim History:  [name] is here today for follow-up and Nplate.    We see him monthly and was last seen on 03/08/2023  He has been doing well.  He really has had no complaints.    He is working with his urologist regarding his nocturia    There is been no problems with bleeding.  He has had no bruising.  He has had no change in bowel or bladder habits.  His appetite is doing all right.  He has had no issues with cough or shortness of breath.  No recent illness  Overall, I would say that his performance status is probably ECOG 1.  Wt Readings from Last 3 Encounters:  04/05/23 188 lb 4.8 oz (85.4 kg)  03/08/23 188 lb (85.3 kg)  02/08/23 192 lb (87.1 kg)     Medications:  Allergies as of 04/05/2023   No Known Allergies      Medication List        Accurate as of April 05, 2023  2:48 PM. If you have any questions, ask your nurse or doctor.          amLODipine 10 MG tablet Commonly known as: NORVASC Take 10 mg by mouth daily.   atenolol 25 MG tablet Commonly known as: TENORMIN Take 12.5 mg by mouth 2 (two) times daily.   fluticasone 50 MCG/ACT nasal spray Commonly known as: FLONASE Place 1 spray into both nostrils daily.   hydrocortisone cream 1 % Apply topically 2 (two) times daily.   latanoprost 0.005 % ophthalmic solution Commonly known as: XALATAN Place 1 drop into both eyes 2 (two) times daily.   losartan-hydrochlorothiazide 100-12.5 MG tablet Commonly known as: HYZAAR Take 0.5 tablets by mouth daily. 1/2 tablet in the morning, 1/2 tablet at night time.   multivitamin with minerals Tabs tablet Take 1 tablet by mouth daily.   pantoprazole 40 MG tablet Commonly known as: PROTONIX Take 40 mg by mouth 2 (two) times daily.   rosuvastatin  20 MG tablet Commonly known as: CRESTOR Take 20 mg by mouth daily.   tamsulosin 0.4 MG Caps capsule Commonly known as: FLOMAX Take 0.4 mg by mouth at bedtime.        Allergies: No Known Allergies  Past Medical History, Surgical history, Social history, and Family History were reviewed and updated.  Review of Systems: Review of Systems  Constitutional: Negative.   HENT: Negative.    Eyes: Negative.   Respiratory: Negative.    Cardiovascular: Negative.   Gastrointestinal: Negative.   Genitourinary: Negative.   Musculoskeletal: Negative.   Skin: Negative.   Neurological: Negative.   Endo/Heme/Allergies: Negative.   Psychiatric/Behavioral: Negative.     Physical Exam: Vitals:   04/05/23 1430  BP: 132/74  Pulse: (!) 58  Resp: 18  Temp: 98.5 F (36.9 C)  SpO2: 100%     Wt Readings from Last 3 Encounters:  04/05/23 188 lb 4.8 oz (85.4 kg)  03/08/23 188 lb (85.3 kg)  02/08/23 192 lb (87.1 kg)    Physical Exam Vitals reviewed.  HENT:     Head: Normocephalic and atraumatic.  Eyes:     Pupils: Pupils are equal, round, and reactive to light.  Cardiovascular:     Rate and Rhythm: Normal  rate and regular rhythm.     Heart sounds: Normal heart sounds.  Pulmonary:     Effort: Pulmonary effort is normal.     Breath sounds: Normal breath sounds.  Abdominal:     General: Bowel sounds are normal.     Palpations: Abdomen is soft.  Musculoskeletal:        General: No tenderness or deformity. Normal range of motion.     Cervical back: Normal range of motion.  Lymphadenopathy:     Cervical: No cervical adenopathy.  Skin:    General: Skin is warm and dry.     Findings: No erythema or rash.  Neurological:     Mental Status: He is alert and oriented to person, place, and time.  Psychiatric:        Behavior: Behavior normal.        Thought Content: Thought content normal.        Judgment: Judgment normal.       Lab Results  Component Value Date   WBC 6.2  04/05/2023   HGB 11.3 (L) 04/05/2023   HCT 33.2 (L) 04/05/2023   MCV 92.7 04/05/2023   PLT 56 (L) 04/05/2023   Lab Results  Component Value Date   FERRITIN 76 03/08/2023   IRON 90 03/08/2023   TIBC 344 03/08/2023   UIBC 254 03/08/2023   IRONPCTSAT 26 03/08/2023   Lab Results  Component Value Date   RETICCTPCT 4.2 (H) 10/12/2022   RBC 3.58 (L) 04/05/2023   No results found for: "KPAFRELGTCHN", "LAMBDASER", "KAPLAMBRATIO" No results found for: "IGGSERUM", "IGA", "IGMSERUM" No results found for: "TOTALPROTELP", "ALBUMINELP", "A1GS", "A2GS", "BETS", "BETA2SER", "GAMS", "MSPIKE", "SPEI"   Chemistry      Component Value Date/Time   NA 141 03/08/2023 1338   NA 140 05/11/2019 0946   K 3.9 03/08/2023 1338   CL 101 03/08/2023 1338   CO2 30 03/08/2023 1338   BUN 22 03/08/2023 1338   BUN 17 05/11/2019 0946   CREATININE 1.68 (H) 03/08/2023 1338   CREATININE 0.75 04/10/2016 1132      Component Value Date/Time   CALCIUM 9.8 03/08/2023 1338   ALKPHOS 36 (L) 03/08/2023 1338   AST 20 03/08/2023 1338   ALT 8 03/08/2023 1338   BILITOT 0.9 03/08/2023 1338      Encounter Diagnoses  Name Primary?   Chronic ITP (idiopathic thrombocytopenia) (HCC) Yes   Iron deficiency anemia, unspecified iron deficiency anemia type     Impression and Plan: Lonnie Hansen is a very pleasant 73 yo African American gentleman with chronic ITP.  His platelets are on the lower side but stable. He is asymptomatic.  If, and when, he does need to have surgery, our plan is to be much more aggressive with his interventions to get his platelet count back up.  His platelet count today is 56 which is up from 53 one month ago.  No bleeding. Hgb is stable at 11.3 He will get Nplate today RTC 1 month MD, labs (CBC w, CMP, iron, ferritin, save smear) Nplate    Rushie Chestnut, PA-C 3/10/20252:48 PM

## 2023-04-06 LAB — IRON AND IRON BINDING CAPACITY (CC-WL,HP ONLY)
Iron: 71 ug/dL (ref 45–182)
Saturation Ratios: 21 % (ref 17.9–39.5)
TIBC: 343 ug/dL (ref 250–450)
UIBC: 272 ug/dL (ref 117–376)

## 2023-04-30 ENCOUNTER — Other Ambulatory Visit: Payer: Self-pay | Admitting: *Deleted

## 2023-04-30 DIAGNOSIS — D693 Immune thrombocytopenic purpura: Secondary | ICD-10-CM

## 2023-05-03 ENCOUNTER — Inpatient Hospital Stay (HOSPITAL_BASED_OUTPATIENT_CLINIC_OR_DEPARTMENT_OTHER): Admitting: Medical Oncology

## 2023-05-03 ENCOUNTER — Inpatient Hospital Stay

## 2023-05-03 ENCOUNTER — Inpatient Hospital Stay: Attending: Hematology & Oncology

## 2023-05-03 ENCOUNTER — Other Ambulatory Visit: Payer: Self-pay

## 2023-05-03 ENCOUNTER — Encounter: Payer: Self-pay | Admitting: Medical Oncology

## 2023-05-03 VITALS — BP 138/68 | HR 49 | Resp 20 | Ht 69.0 in | Wt 182.6 lb

## 2023-05-03 DIAGNOSIS — Z79899 Other long term (current) drug therapy: Secondary | ICD-10-CM | POA: Insufficient documentation

## 2023-05-03 DIAGNOSIS — R001 Bradycardia, unspecified: Secondary | ICD-10-CM | POA: Insufficient documentation

## 2023-05-03 DIAGNOSIS — D693 Immune thrombocytopenic purpura: Secondary | ICD-10-CM | POA: Insufficient documentation

## 2023-05-03 DIAGNOSIS — R42 Dizziness and giddiness: Secondary | ICD-10-CM | POA: Insufficient documentation

## 2023-05-03 LAB — CBC WITH DIFFERENTIAL (CANCER CENTER ONLY)
Abs Immature Granulocytes: 0.02 10*3/uL (ref 0.00–0.07)
Basophils Absolute: 0 10*3/uL (ref 0.0–0.1)
Basophils Relative: 0 %
Eosinophils Absolute: 0.2 10*3/uL (ref 0.0–0.5)
Eosinophils Relative: 2 %
HCT: 33.1 % — ABNORMAL LOW (ref 39.0–52.0)
Hemoglobin: 11 g/dL — ABNORMAL LOW (ref 13.0–17.0)
Immature Granulocytes: 0 %
Lymphocytes Relative: 13 %
Lymphs Abs: 1 10*3/uL (ref 0.7–4.0)
MCH: 31.3 pg (ref 26.0–34.0)
MCHC: 33.2 g/dL (ref 30.0–36.0)
MCV: 94.3 fL (ref 80.0–100.0)
Monocytes Absolute: 0.8 10*3/uL (ref 0.1–1.0)
Monocytes Relative: 10 %
Neutro Abs: 5.8 10*3/uL (ref 1.7–7.7)
Neutrophils Relative %: 75 %
Platelet Count: 63 10*3/uL — ABNORMAL LOW (ref 150–400)
RBC: 3.51 MIL/uL — ABNORMAL LOW (ref 4.22–5.81)
RDW: 13.1 % (ref 11.5–15.5)
WBC Count: 7.8 10*3/uL (ref 4.0–10.5)
nRBC: 0 % (ref 0.0–0.2)

## 2023-05-03 LAB — CMP (CANCER CENTER ONLY)
ALT: 8 U/L (ref 0–44)
AST: 17 U/L (ref 15–41)
Albumin: 4.7 g/dL (ref 3.5–5.0)
Alkaline Phosphatase: 43 U/L (ref 38–126)
Anion gap: 10 (ref 5–15)
BUN: 15 mg/dL (ref 8–23)
CO2: 29 mmol/L (ref 22–32)
Calcium: 9.6 mg/dL (ref 8.9–10.3)
Chloride: 101 mmol/L (ref 98–111)
Creatinine: 1.43 mg/dL — ABNORMAL HIGH (ref 0.61–1.24)
GFR, Estimated: 52 mL/min — ABNORMAL LOW (ref >60)
Glucose, Bld: 107 mg/dL — ABNORMAL HIGH (ref 70–99)
Potassium: 4.1 mmol/L (ref 3.5–5.1)
Sodium: 140 mmol/L (ref 135–145)
Total Bilirubin: 0.8 mg/dL (ref 0.0–1.2)
Total Protein: 7.4 g/dL (ref 6.5–8.1)

## 2023-05-03 MED ORDER — ROMIPLOSTIM 125 MCG ~~LOC~~ SOLR
1.1200 ug/kg | Freq: Once | SUBCUTANEOUS | Status: AC
  Administered 2023-05-03: 95 ug via SUBCUTANEOUS
  Filled 2023-05-03: qty 0.19

## 2023-05-03 NOTE — Patient Instructions (Signed)
 Romiplostim Injection What is this medication? ROMIPLOSTIM (roe mi PLOE stim) treats low levels of platelets in your body caused by immune thrombocytopenia (ITP). It is prescribed when other medications have not worked or cannot be tolerated. It may also be used to help people who have been exposed to high doses of radiation. It works by increasing the amount of platelets in your blood. This lowers the risk of bleeding. This medicine may be used for other purposes; ask your health care provider or pharmacist if you have questions. COMMON BRAND NAME(S): Nplate What should I tell my care team before I take this medication? They need to know if you have any of these conditions: Blood clots Myelodysplastic syndrome An unusual or allergic reaction to romiplostim, mannitol, other medications, foods, dyes, or preservatives Pregnant or trying to get pregnant Breast-feeding How should I use this medication? This medication is injected under the skin. It is given by a care team in a hospital or clinic setting. A special MedGuide will be given to you before each treatment. Be sure to read this information carefully each time. Talk to your care team about the use of this medication in children. While it may be prescribed for children as young as newborns for selected conditions, precautions do apply. Overdosage: If you think you have taken too much of this medicine contact a poison control center or emergency room at once. NOTE: This medicine is only for you. Do not share this medicine with others. What if I miss a dose? Keep appointments for follow-up doses. It is important not to miss your dose. Call your care team if you are unable to keep an appointment. What may interact with this medication? Interactions are not expected. This list may not describe all possible interactions. Give your health care provider a list of all the medicines, herbs, non-prescription drugs, or dietary supplements you use. Also  tell them if you smoke, drink alcohol, or use illegal drugs. Some items may interact with your medicine. What should I watch for while using this medication? Visit your care team for regular checks on your progress. You may need blood work done while you are taking this medication. Your condition will be monitored carefully while you are receiving this medication. It is important not to miss any appointments. What side effects may I notice from receiving this medication? Side effects that you should report to your care team as soon as possible: Allergic reactions--skin rash, itching, hives, swelling of the face, lips, tongue, or throat Blood clot--pain, swelling, or warmth in the leg, shortness of breath, chest pain Side effects that usually do not require medical attention (report to your care team if they continue or are bothersome): Dizziness Joint pain Muscle pain Pain in the hands or feet Stomach pain Trouble sleeping This list may not describe all possible side effects. Call your doctor for medical advice about side effects. You may report side effects to FDA at 1-800-FDA-1088. Where should I keep my medication? This medication is given in a hospital or clinic. It will not be stored at home. NOTE: This sheet is a summary. It may not cover all possible information. If you have questions about this medicine, talk to your doctor, pharmacist, or health care provider.  2024 Elsevier/Gold Standard (2021-05-19 00:00:00)

## 2023-05-03 NOTE — Progress Notes (Signed)
 Hematology and Oncology Follow Up Visit  TAYTEN BERGDOLL [id number] [birth year] 73 y.o. 05/03/2023   Principle Diagnosis:  Chronic ITP   Current Therapy:        Nplate to keep platelet count over 100,000   Interim History:  Mr. Mecca is here today for follow-up and Nplate.    We continue to see him monthly   He has been doing well.  He really has had no complaints.   He is a bit bradycardic today. When we discussed this he does report occasional lightheadedness. No dose changes in his Atenolol. No chest pains, syncope or current dizziness. He will call his cardiologist after he leaves our office to see if dose adjustment of his atenolol is recommended.   There is been no problems with bleeding.  He has had no bruising.  He has had no change in bowel or bladder habits.  His appetite is doing all right.  He has had no issues with cough or shortness of breath.  No recent illness  Overall, I would say that his performance status is probably ECOG 1.  Wt Readings from Last 3 Encounters:  05/03/23 182 lb 9.6 oz (82.8 kg)  04/05/23 188 lb 4.8 oz (85.4 kg)  03/08/23 188 lb (85.3 kg)     Medications:  Allergies as of 05/03/2023   No Known Allergies      Medication List        Accurate as of May 03, 2023  2:55 PM. If you have any questions, ask your nurse or doctor.          amLODipine 10 MG tablet Commonly known as: NORVASC Take 10 mg by mouth daily.   atenolol 25 MG tablet Commonly known as: TENORMIN Take 12.5 mg by mouth 2 (two) times daily.   fluticasone 50 MCG/ACT nasal spray Commonly known as: FLONASE Place 1 spray into both nostrils daily.   hydrocortisone cream 1 % Apply topically 2 (two) times daily.   latanoprost 0.005 % ophthalmic solution Commonly known as: XALATAN Place 1 drop into both eyes 2 (two) times daily.   losartan-hydrochlorothiazide 100-12.5 MG tablet Commonly known as: HYZAAR Take 0.5 tablets by mouth daily. 1/2 tablet in the morning,  1/2 tablet at night time.   multivitamin with minerals Tabs tablet Take 1 tablet by mouth daily.   pantoprazole 40 MG tablet Commonly known as: PROTONIX Take 40 mg by mouth 2 (two) times daily.   rosuvastatin 20 MG tablet Commonly known as: CRESTOR Take 20 mg by mouth daily.   tamsulosin 0.4 MG Caps capsule Commonly known as: FLOMAX Take 0.4 mg by mouth at bedtime.        Allergies: No Known Allergies  Past Medical History, Surgical history, Social history, and Family History were reviewed and updated.  Review of Systems: Review of Systems  Constitutional: Negative.   HENT: Negative.    Eyes: Negative.   Respiratory: Negative.    Cardiovascular: Negative.   Gastrointestinal: Negative.   Genitourinary: Negative.   Musculoskeletal: Negative.   Skin: Negative.   Neurological: Negative.   Endo/Heme/Allergies: Negative.   Psychiatric/Behavioral: Negative.     Physical Exam: Vitals:   05/03/23 1427  BP: 138/68  Pulse: (!) 49  Resp: 20  SpO2: 100%     Wt Readings from Last 3 Encounters:  05/03/23 182 lb 9.6 oz (82.8 kg)  04/05/23 188 lb 4.8 oz (85.4 kg)  03/08/23 188 lb (85.3 kg)    Physical Exam Vitals reviewed.  HENT:  Head: Normocephalic and atraumatic.  Eyes:     Pupils: Pupils are equal, round, and reactive to light.  Cardiovascular:     Rate and Rhythm: Regular rhythm. Bradycardia present.     Heart sounds: Normal heart sounds.  Pulmonary:     Effort: Pulmonary effort is normal.     Breath sounds: Normal breath sounds.  Abdominal:     General: Bowel sounds are normal.     Palpations: Abdomen is soft.  Musculoskeletal:        General: No tenderness or deformity. Normal range of motion.     Cervical back: Normal range of motion.  Lymphadenopathy:     Cervical: No cervical adenopathy.  Skin:    General: Skin is warm and dry.     Findings: No erythema or rash.  Neurological:     Mental Status: He is alert and oriented to person, place, and  time.  Psychiatric:        Behavior: Behavior normal.        Thought Content: Thought content normal.        Judgment: Judgment normal.       Lab Results  Component Value Date   WBC 7.8 05/03/2023   HGB 11.0 (L) 05/03/2023   HCT 33.1 (L) 05/03/2023   MCV 94.3 05/03/2023   PLT 63 (L) 05/03/2023   Lab Results  Component Value Date   FERRITIN 68 04/05/2023   IRON 71 04/05/2023   TIBC 343 04/05/2023   UIBC 272 04/05/2023   IRONPCTSAT 21 04/05/2023   Lab Results  Component Value Date   RETICCTPCT 4.2 (H) 10/12/2022   RBC 3.51 (L) 05/03/2023   No results found for: "KPAFRELGTCHN", "LAMBDASER", "KAPLAMBRATIO" No results found for: "IGGSERUM", "IGA", "IGMSERUM" No results found for: "TOTALPROTELP", "ALBUMINELP", "A1GS", "A2GS", "BETS", "BETA2SER", "GAMS", "MSPIKE", "SPEI"   Chemistry      Component Value Date/Time   NA 140 05/03/2023 1401   NA 140 05/11/2019 0946   K 4.1 05/03/2023 1401   CL 101 05/03/2023 1401   CO2 29 05/03/2023 1401   BUN 15 05/03/2023 1401   BUN 17 05/11/2019 0946   CREATININE 1.43 (H) 05/03/2023 1401   CREATININE 0.75 04/10/2016 1132      Component Value Date/Time   CALCIUM 9.6 05/03/2023 1401   ALKPHOS 43 05/03/2023 1401   AST 17 05/03/2023 1401   ALT 8 05/03/2023 1401   BILITOT 0.8 05/03/2023 1401      Encounter Diagnoses  Name Primary?   Chronic ITP (idiopathic thrombocytopenia) (HCC) Yes   Bradycardia     Impression and Plan: Mr. Granquist is a very pleasant 73 yo African American gentleman with chronic ITP.  His platelets are on the lower side but stable. He is asymptomatic.  If, and when, he does need to have surgery, our plan is to be much more aggressive with his interventions to get his platelet count back up.  Reviewed precautions regarding his bradycardia. He will contact cardiology today for further recommendations and to schedule a sooner follow up.   Today his platelet count is 63 which is up from 56 at his last visit.  No  bleeding Hgb is stable at 11.0 He will get Nplate today  RTC 1 month MD, labs (CBC w, CMP, iron, ferritin, save smear) Nplate    Rushie Chestnut, PA-C 4/7/20252:55 PM

## 2023-05-26 ENCOUNTER — Ambulatory Visit (HOSPITAL_COMMUNITY)
Admission: RE | Admit: 2023-05-26 | Discharge: 2023-05-26 | Disposition: A | Discharge status: Home / Self Care | Payer: Medicare Other | Source: Ambulatory Visit | Attending: Vascular Surgery | Admitting: Vascular Surgery

## 2023-05-26 ENCOUNTER — Ambulatory Visit: Payer: Medicare Other | Attending: Vascular Surgery | Admitting: Vascular Surgery

## 2023-05-26 ENCOUNTER — Encounter: Payer: Self-pay | Admitting: Vascular Surgery

## 2023-05-26 VITALS — BP 136/77 | HR 53 | Temp 98.3°F | Ht 69.0 in | Wt 182.0 lb

## 2023-05-26 DIAGNOSIS — I714 Abdominal aortic aneurysm, without rupture, unspecified: Secondary | ICD-10-CM

## 2023-05-26 DIAGNOSIS — I7143 Infrarenal abdominal aortic aneurysm, without rupture: Secondary | ICD-10-CM | POA: Insufficient documentation

## 2023-05-26 NOTE — Progress Notes (Signed)
 Patient ID: Lonnie Hansen, male   DOB: 03/17/1950, 73 y.o.   MRN: [id number]  Reason for Consult: No chief complaint on file.   Referred by Bertha Broad, MD  Subjective:     HPI:  Lonnie Hansen is a 73 y.o. 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  Overall he has recovered very well is back to his usual state of health.  He is here today with EVAR duplex.  Continues to follow with Dr. Maria Shiner for ITP.  Past Medical History:  Diagnosis Date   AAA (abdominal aortic aneurysm) (HCC)    Anemia    Arthritis    Asymptomatic bilateral carotid artery stenosis 08/2015   1-39%    Cataracts, bilateral    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   Coronary artery disease    Diverticulosis    Enlarged aorta (HCC)    Enlarged prostate    slightly   GERD (gastroesophageal reflux disease)    takes Pantoprazole  daily as needed   Glaucoma    uses eye drops daily   Headache    History of colon polyps    benign   History of kidney stones    Hyperlipidemia    no on any meds   Hypertension    takes Amlodipine  and Atenolol  daily   Idiopathic thrombocytopenia (HCC)    OSA on CPAP    Vocal cord nodule    pt. states  it's a" growth on vocal cord"   Family History  Problem Relation Age of Onset   Lymphoma Mother    Heart disease Father    Heart attack Father    Thyroid  disease Sister    Past Surgical History:  Procedure Laterality Date   ABDOMINAL AORTIC ENDOVASCULAR STENT GRAFT N/A 11/28/2021   Procedure: ABDOMINAL AORTIC ENDOVASCULAR STENT GRAFT;  Surgeon: Adine Hoof, MD;  Location: [facility] OR;  Service: Vascular;  Laterality: N/A;   ABDOMINAL AORTOGRAM N/A 10/05/2022   Procedure: ABDOMINAL AORTOGRAM;  Surgeon: Adine Hoof, MD;  Location: [facility] INVASIVE CV LAB;  Service: Cardiovascular;  Laterality: N/A;    ANEURYSM COILING Right 11/28/2021   Procedure: ANEURYSM COILING RIGHT INTERNAL ILIAC;  Surgeon: Adine Hoof, MD;  Location: [facility] OR;  Service: Vascular;  Laterality: Right;   AORTIC ARCH ANGIOGRAPHY N/A 04/16/2016   Procedure: Aortic Arch Angiography;  Surgeon: [name] Swaziland, MD;  Location: [facility] INVASIVE CV LAB;  Service: Cardiovascular;  Laterality: N/A;   AORTIC VALVE REPLACEMENT N/A 09/15/2016   Procedure: AORTIC VALVE REPLACEMENT (AVR);  Surgeon: Norita Beauvais, MD;  Location: [facility] OR;  Service: Open Heart Surgery;  Laterality: N/A;  Using 29mm Edwards Perimount Magna Ease Aortic Bioprosthesis Valve   ASCENDING AORTIC ROOT REPLACEMENT N/A 09/15/2016   Procedure: ASCENDING AORTIC ROOT REPLACEMENT;  Surgeon: Norita Beauvais, MD;  Location: [facility] OR;  Service: Open Heart Surgery;  Laterality: N/A;  Using 32mm Gelweave Valsalva Graft   CHOLECYSTECTOMY N/A 12/19/2020   Procedure: LAPAROSCOPIC CHOLECYSTECTOMY WITH POSSIBLE INTRAOPERATIVE CHOLANGIOGRAM;  Surgeon: Aldean Hummingbird, MD;  Location: [facility] OR;  Service: General;  Laterality: N/A;   COLONOSCOPY     COLONOSCOPY Left 04/16/2019   Procedure: COLONOSCOPY;  Surgeon: Evangeline Hilts, MD;  Location: [facility] ENDOSCOPY;  Service: Endoscopy;  Laterality: Left;   COLONOSCOPY N/A 06/01/2020   Procedure: COLONOSCOPY;  Surgeon: Genell Ken, MD;  Location: [facility] ENDOSCOPY;  Service: Gastroenterology;  Laterality: N/A;   COLONOSCOPY WITH ESOPHAGOGASTRODUODENOSCOPY (EGD)     COLONOSCOPY WITH PROPOFOL  N/A 05/14/2021   Procedure: COLONOSCOPY WITH PROPOFOL ;  Surgeon: Genell Ken, MD;  Location: [facility] ENDOSCOPY;  Service: Gastroenterology;  Laterality: N/A;   ERCP N/A 08/19/2021   Procedure: ENDOSCOPIC RETROGRADE CHOLANGIOPANCREATOGRAPHY (ERCP);  Surgeon: Ozell Blunt, MD;  Location: [facility] ENDOSCOPY;  Service: Gastroenterology;  Laterality: N/A;  at 1:30 pm   ESOPHAGOGASTRODUODENOSCOPY N/A 05/25/2020   Procedure: ESOPHAGOGASTRODUODENOSCOPY (EGD);  Surgeon: Baldo Bonds, MD;  Location: [facility] ENDOSCOPY;  Service: Endoscopy;  Laterality: N/A;   ESOPHAGOGASTRODUODENOSCOPY N/A 08/19/2021   Procedure: ESOPHAGOGASTRODUODENOSCOPY (EGD);  Surgeon: Felecia Hopper, MD;  Location: [facility] ENDOSCOPY;  Service: Gastroenterology;  Laterality: N/A;   ESOPHAGOGASTRODUODENOSCOPY (EGD) WITH PROPOFOL  Left 04/16/2019   Procedure: ESOPHAGOGASTRODUODENOSCOPY (EGD) WITH PROPOFOL ;  Surgeon: Evangeline Hilts, MD;  Location: [facility] ENDOSCOPY;  Service: Endoscopy;  Laterality: Left;   ESOPHAGOGASTRODUODENOSCOPY (EGD) WITH PROPOFOL  N/A 08/23/2021   Procedure: ESOPHAGOGASTRODUODENOSCOPY (EGD) WITH PROPOFOL ;  Surgeon: Genell Ken, MD;  Location: [facility] ENDOSCOPY;  Service: Gastroenterology;  Laterality: N/A;   FLEXIBLE SIGMOIDOSCOPY N/A 05/25/2020   Procedure: FLEXIBLE SIGMOIDOSCOPY;  Surgeon: Baldo Bonds, MD;  Location: [facility] ENDOSCOPY;  Service: Endoscopy;  Laterality: N/A;   HEMOSTASIS CONTROL  08/19/2021   Procedure: HEMOSTASIS CONTROL;  Surgeon: Felecia Hopper, MD;  Location: [facility] ENDOSCOPY;  Service: Gastroenterology;;   IR ANGIOGRAM SELECTIVE EACH ADDITIONAL VESSEL  05/26/2021   IR ANGIOGRAM SELECTIVE EACH ADDITIONAL VESSEL  08/20/2021   IR ANGIOGRAM SELECTIVE EACH ADDITIONAL VESSEL  08/20/2021   IR ANGIOGRAM VISCERAL SELECTIVE  05/26/2021   IR ANGIOGRAM VISCERAL SELECTIVE  08/20/2021   IR EMBO ART  VEN HEMORR LYMPH EXTRAV  INC GUIDE ROADMAPPING  05/26/2021   IR EMBO ART  VEN HEMORR LYMPH EXTRAV  INC GUIDE ROADMAPPING  08/20/2021   IR FLUORO GUIDE CV LINE RIGHT  08/20/2021   IR THORACENTESIS ASP PLEURAL SPACE W/IMG GUIDE  10/23/2016   IR US  GUIDE VASC ACCESS LEFT  05/26/2021   IR US  GUIDE VASC ACCESS RIGHT  05/26/2021   IR US  GUIDE VASC ACCESS RIGHT  08/20/2021   IR US  GUIDE VASC ACCESS RIGHT  08/20/2021   MICROLARYNGOSCOPY Right 09/20/2017   Procedure: MICROLARYNGOSCOPY WITH  EXCISION OF VOCAL CORD LESION;  Surgeon: Reynold Caves, MD;  Location: [facility];  Service: ENT;  Laterality: Right;    MULTIPLE EXTRACTIONS WITH ALVEOLOPLASTY N/A 06/10/2016   Procedure: Extraction of tooth #'s 2,8,13,15, and 29  with alveoloplasty, maxillary right and left buccal exostoses reductions, and gross debridement of remaining teeth.;  Surgeon: Carol Chroman, DDS;  Location: [facility] OR;  Service: Oral Surgery;  Laterality: N/A;   POLYPECTOMY  05/14/2021   Procedure: POLYPECTOMY;  Surgeon: Genell Ken, MD;  Location: [facility] ENDOSCOPY;  Service: Gastroenterology;;   REMOVAL OF STONES  08/19/2021   Procedure: REMOVAL OF STONES;  Surgeon: Ozell Blunt, MD;  Location: [facility] ENDOSCOPY;  Service: Gastroenterology;;   RIGHT/LEFT HEART CATH AND CORONARY ANGIOGRAPHY N/A 04/16/2016   Procedure: Right/Left Heart Cath and Coronary Angiography;  Surgeon: [name] Swaziland, MD;  Location: [facility] INVASIVE CV LAB;  Service: Cardiovascular;  Laterality: N/A;   SPHINCTEROTOMY  08/19/2021   Procedure: SPHINCTEROTOMY;  Surgeon: Ozell Blunt, MD;  Location: [facility] ENDOSCOPY;  Service: Gastroenterology;;   TEE WITHOUT CARDIOVERSION N/A 09/15/2016   Procedure: TRANSESOPHAGEAL ECHOCARDIOGRAM (TEE);  Surgeon: Norita Beauvais, MD;  Location: [facility] OR;  Service: Open Heart Surgery;  Laterality: N/A;    Short Social History:  Social History   Tobacco  Use   Smoking status: Former    Current packs/day: 1.00    Average packs/day: 1 pack/day for 25.0 years (25.0 ttl pk-yrs)    Types: Cigarettes   Smokeless tobacco: Never  Substance Use Topics   Alcohol  use: No    Alcohol /week: 0.0 standard drinks of alcohol     No Known Allergies  Current Outpatient Medications  Medication Sig Dispense Refill   amLODipine  (NORVASC ) 10 MG tablet Take 10 mg by mouth daily.     atenolol  (TENORMIN ) 25 MG tablet Take 12.5 mg by mouth 2 (two) times daily. 180 tablet 3   fluticasone  (FLONASE ) 50 MCG/ACT nasal spray Place 1 spray into both nostrils daily.     hydrocortisone  cream 1 % Apply topically 2 (two) times daily. 30 g 0   latanoprost  (XALATAN ) 0.005 % ophthalmic  solution Place 1 drop into both eyes 2 (two) times daily.     losartan -hydrochlorothiazide  (HYZAAR) 100-12.5 MG tablet Take 0.5 tablets by mouth daily. 1/2 tablet in the morning, 1/2 tablet at night time.     Multiple Vitamin (MULTIVITAMIN WITH MINERALS) TABS tablet Take 1 tablet by mouth daily.     pantoprazole  (PROTONIX ) 40 MG tablet Take 40 mg by mouth 2 (two) times daily.     rosuvastatin  (CRESTOR ) 20 MG tablet Take 20 mg by mouth daily.     tamsulosin (FLOMAX) 0.4 MG CAPS capsule Take 0.4 mg by mouth at bedtime.     No current facility-administered medications for this visit.    Review of Systems  Constitutional:  Constitutional negative. HENT: HENT negative.  Eyes: Eyes negative.  Respiratory: Respiratory negative.  Cardiovascular: Cardiovascular negative.  GI: Gastrointestinal negative.  Musculoskeletal: Musculoskeletal negative.  Skin: Skin negative.  Neurological: Positive for numbness.  Hematologic: Hematologic/lymphatic negative.  Psychiatric: Psychiatric negative.        Objective:  Objective   Vitals:   05/26/23 0935  BP: 136/77  Pulse: (!) 53  Temp: 98.3 F (36.8 C)  SpO2: 97%     Physical Exam HENT:     Head: Normocephalic.     Mouth/Throat:     Mouth: Mucous membranes are moist.  Eyes:     Pupils: Pupils are equal, round, and reactive to light.  Cardiovascular:     Pulses:          Femoral pulses are 2+ on the right side and 2+ on the left side.      Dorsalis pedis pulses are 2+ on the right side and 2+ on the left side.  Pulmonary:     Effort: Pulmonary effort is normal.  Abdominal:     General: Abdomen is flat.     Palpations: Abdomen is soft.  Neurological:     Mental Status: He is alert.    Data: Endovascular Aortic Repair (EVAR):  +----------+----------------+----------------+------------------+----------  ----+           Diameter AP (cm)Diameter Trans  Velocities        Comments                                   (cm)             (cm/sec)                           +----------+----------------+----------------+------------------+----------  ----+  Aorta    5.00  5.17            68                                 +----------+----------------+----------------+------------------+----------  ----+  Right Limb1.09                                              not  visualized  +----------+----------------+----------------+------------------+----------  ----+  Left Limb                                 91                                 +----------+----------------+----------------+------------------+----------  ----+        Summary:  Abdominal Aorta: Patent endovascular aneurysm repair with no evidence of  endoleak. The largest aortic diameter has decreased compared to prior  exam. Previous diameter measurement was 5.5 cm obtained on 11/03/2022 on  CT angio.      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  EVAR duplex satisfactory today will follow-up in 1 year with repeat duplex.  He is okay from a vascular standpoint to undergo hip replacement as needed.     Adine Hoof MD [facility]

## 2023-05-27 ENCOUNTER — Encounter: Payer: Self-pay | Admitting: Hematology & Oncology

## 2023-06-02 ENCOUNTER — Other Ambulatory Visit: Payer: Self-pay | Admitting: Surgery

## 2023-06-02 DIAGNOSIS — I7121 Aneurysm of the ascending aorta, without rupture: Secondary | ICD-10-CM

## 2023-06-03 ENCOUNTER — Other Ambulatory Visit: Payer: Self-pay

## 2023-06-03 ENCOUNTER — Inpatient Hospital Stay

## 2023-06-03 ENCOUNTER — Inpatient Hospital Stay: Attending: Hematology & Oncology

## 2023-06-03 ENCOUNTER — Inpatient Hospital Stay: Admitting: Hematology & Oncology

## 2023-06-03 ENCOUNTER — Encounter: Payer: Self-pay | Admitting: Hematology & Oncology

## 2023-06-03 VITALS — BP 131/75 | HR 57 | Temp 98.0°F | Resp 18 | Ht 69.0 in | Wt 186.0 lb

## 2023-06-03 DIAGNOSIS — D693 Immune thrombocytopenic purpura: Secondary | ICD-10-CM

## 2023-06-03 DIAGNOSIS — Z79899 Other long term (current) drug therapy: Secondary | ICD-10-CM | POA: Insufficient documentation

## 2023-06-03 LAB — CMP (CANCER CENTER ONLY)
ALT: 9 U/L (ref 0–44)
AST: 19 U/L (ref 15–41)
Albumin: 4.9 g/dL (ref 3.5–5.0)
Alkaline Phosphatase: 48 U/L (ref 38–126)
Anion gap: 9 (ref 5–15)
BUN: 22 mg/dL (ref 8–23)
CO2: 29 mmol/L (ref 22–32)
Calcium: 9.8 mg/dL (ref 8.9–10.3)
Chloride: 103 mmol/L (ref 98–111)
Creatinine: 1.76 mg/dL — ABNORMAL HIGH (ref 0.61–1.24)
GFR, Estimated: 40 mL/min — ABNORMAL LOW (ref >60)
Glucose, Bld: 109 mg/dL — ABNORMAL HIGH (ref 70–99)
Potassium: 4.4 mmol/L (ref 3.5–5.1)
Sodium: 141 mmol/L (ref 135–145)
Total Bilirubin: 0.8 mg/dL (ref 0.0–1.2)
Total Protein: 7.6 g/dL (ref 6.5–8.1)

## 2023-06-03 LAB — CBC WITH DIFFERENTIAL (CANCER CENTER ONLY)
Abs Immature Granulocytes: 0.03 10*3/uL (ref 0.00–0.07)
Basophils Absolute: 0 10*3/uL (ref 0.0–0.1)
Basophils Relative: 1 %
Eosinophils Absolute: 0.1 10*3/uL (ref 0.0–0.5)
Eosinophils Relative: 2 %
HCT: 32.8 % — ABNORMAL LOW (ref 39.0–52.0)
Hemoglobin: 11 g/dL — ABNORMAL LOW (ref 13.0–17.0)
Immature Granulocytes: 1 %
Lymphocytes Relative: 16 %
Lymphs Abs: 1 10*3/uL (ref 0.7–4.0)
MCH: 31.2 pg (ref 26.0–34.0)
MCHC: 33.5 g/dL (ref 30.0–36.0)
MCV: 92.9 fL (ref 80.0–100.0)
Monocytes Absolute: 0.6 10*3/uL (ref 0.1–1.0)
Monocytes Relative: 9 %
Neutro Abs: 4.4 10*3/uL (ref 1.7–7.7)
Neutrophils Relative %: 71 %
Platelet Count: 62 10*3/uL — ABNORMAL LOW (ref 150–400)
RBC: 3.53 MIL/uL — ABNORMAL LOW (ref 4.22–5.81)
RDW: 13.1 % (ref 11.5–15.5)
WBC Count: 6.2 10*3/uL (ref 4.0–10.5)
nRBC: 0 % (ref 0.0–0.2)

## 2023-06-03 LAB — SAVE SMEAR(SSMR), FOR PROVIDER SLIDE REVIEW

## 2023-06-03 LAB — FERRITIN: Ferritin: 108 ng/mL (ref 24–336)

## 2023-06-03 MED ORDER — ROMIPLOSTIM 125 MCG ~~LOC~~ SOLR
95.0000 ug | Freq: Once | SUBCUTANEOUS | Status: AC
Start: 2023-06-03 — End: 2023-06-03
  Administered 2023-06-03: 95 ug via SUBCUTANEOUS
  Filled 2023-06-03: qty 0.19

## 2023-06-03 NOTE — Progress Notes (Signed)
 Hematology and Oncology Follow Up Visit  Lonnie Hansen [id number] 1950-02-19 73 y.o. 06/03/2023   Principle Diagnosis:  Chronic ITP   Current Therapy:        Nplate  to keep platelet count over 100,000   Interim History:  Mr. Petrak is here today for follow-up and Nplate .  We last saw him back in April.  He has been doing well.  He really has had no complaints.  There is been no problems with bleeding.  He has had no bruising.  He has had no change in bowel or bladder habits.  His appetite is doing all right.  He has had no issues with cough or shortness of breath.  He has had no problems with COVID.  There is still question about him having surgery I think for his hip.  He wants to try to hold off on this as long as possible.  He is a little bit worried about have to go back to thoracic surgery.  He needs a CAT scan to assess for any change in his thoracic aortic aneurysm.  Overall, I would say that his performance status is probably ECOG 1.   Medications:  Allergies as of 06/03/2023   No Known Allergies      Medication List        Accurate as of Jun 03, 2023  3:34 PM. If you have any questions, ask your nurse or doctor.          amLODipine  10 MG tablet Commonly known as: NORVASC  Take 10 mg by mouth daily.   atenolol  25 MG tablet Commonly known as: TENORMIN  Take 12.5 mg by mouth 2 (two) times daily.   fluticasone  50 MCG/ACT nasal spray Commonly known as: FLONASE  Place 1 spray into both nostrils daily.   hydrocortisone  cream 1 % Apply topically 2 (two) times daily.   latanoprost  0.005 % ophthalmic solution Commonly known as: XALATAN  Place 1 drop into both eyes 2 (two) times daily.   losartan -hydrochlorothiazide  100-12.5 MG tablet Commonly known as: HYZAAR Take 0.5 tablets by mouth daily. 1/2 tablet in the morning, 1/2 tablet at night time.   multivitamin with minerals Tabs tablet Take 1 tablet by mouth daily.   pantoprazole  40 MG tablet Commonly known  as: PROTONIX  Take 40 mg by mouth 2 (two) times daily.   rosuvastatin  20 MG tablet Commonly known as: CRESTOR  Take 20 mg by mouth daily.   tamsulosin 0.4 MG Caps capsule Commonly known as: FLOMAX Take 0.4 mg by mouth at bedtime.        Allergies: No Known Allergies  Past Medical History, Surgical history, Social history, and Family History were reviewed and updated.  Review of Systems: Review of Systems  Constitutional: Negative.   HENT: Negative.    Eyes: Negative.   Respiratory: Negative.    Cardiovascular: Negative.   Gastrointestinal: Negative.   Genitourinary: Negative.   Musculoskeletal: Negative.   Skin: Negative.   Neurological: Negative.   Endo/Heme/Allergies: Negative.   Psychiatric/Behavioral: Negative.       Physical Exam:  Temperature is 98.  Pulse 57.  Blood pressure 131/75.  Weight is 186 pounds.    Wt Readings from Last 3 Encounters:  06/03/23 186 lb (84.4 kg)  05/26/23 182 lb (82.6 kg)  05/03/23 182 lb 9.6 oz (82.8 kg)    Physical Exam Vitals reviewed.  HENT:     Head: Normocephalic and atraumatic.  Eyes:     Pupils: Pupils are equal, round, and reactive to light.  Cardiovascular:  Rate and Rhythm: Normal rate and regular rhythm.     Heart sounds: Normal heart sounds.  Pulmonary:     Effort: Pulmonary effort is normal.     Breath sounds: Normal breath sounds.  Abdominal:     General: Bowel sounds are normal.     Palpations: Abdomen is soft.  Musculoskeletal:        General: No tenderness or deformity. Normal range of motion.     Cervical back: Normal range of motion.  Lymphadenopathy:     Cervical: No cervical adenopathy.  Skin:    General: Skin is warm and dry.     Findings: No erythema or rash.  Neurological:     Mental Status: He is alert and oriented to person, place, and time.  Psychiatric:        Behavior: Behavior normal.        Thought Content: Thought content normal.        Judgment: Judgment normal.      Lab  Results  Component Value Date   WBC 6.2 06/03/2023   HGB 11.0 (L) 06/03/2023   HCT 32.8 (L) 06/03/2023   MCV 92.9 06/03/2023   PLT 62 (L) 06/03/2023   Lab Results  Component Value Date   FERRITIN 68 04/05/2023   IRON  71 04/05/2023   TIBC 343 04/05/2023   UIBC 272 04/05/2023   IRONPCTSAT 21 04/05/2023   Lab Results  Component Value Date   RETICCTPCT 4.2 (H) 10/12/2022   RBC 3.53 (L) 06/03/2023   No results found for: "KPAFRELGTCHN", "LAMBDASER", "KAPLAMBRATIO" No results found for: "IGGSERUM", "IGA", "IGMSERUM" No results found for: "TOTALPROTELP", "ALBUMINELP", "A1GS", "A2GS", "BETS", "BETA2SER", "GAMS", "MSPIKE", "SPEI"   Chemistry      Component Value Date/Time   NA 141 06/03/2023 1429   NA 140 05/11/2019 0946   K 4.4 06/03/2023 1429   CL 103 06/03/2023 1429   CO2 29 06/03/2023 1429   BUN 22 06/03/2023 1429   BUN 17 05/11/2019 0946   CREATININE 1.76 (H) 06/03/2023 1429   CREATININE 0.75 04/10/2016 1132      Component Value Date/Time   CALCIUM  9.8 06/03/2023 1429   ALKPHOS 48 06/03/2023 1429   AST 19 06/03/2023 1429   ALT 9 06/03/2023 1429   BILITOT 0.8 06/03/2023 1429       Impression and Plan: Mr. Tylutki is a very pleasant 73 yo African American gentleman with chronic ITP.  His platelet count is on the lower side.  However, it is stable.  He is asymptomatic.  We will go ahead and give him Nplate .  If he ever does need surgery for his hip, I certainly would have no problems with this.  I am sure that we could get his platelet count where he needed to be.  Will plan to go ahead and get him back to see us  in probably about 2 months or so.    Ivor Mars, MD 5/8/20253:34 PM

## 2023-06-03 NOTE — Patient Instructions (Signed)
 Romiplostim Injection What is this medication? ROMIPLOSTIM (roe mi PLOE stim) treats low levels of platelets in your body caused by immune thrombocytopenia (ITP). It is prescribed when other medications have not worked or cannot be tolerated. It may also be used to help people who have been exposed to high doses of radiation. It works by increasing the amount of platelets in your blood. This lowers the risk of bleeding. This medicine may be used for other purposes; ask your health care provider or pharmacist if you have questions. COMMON BRAND NAME(S): Nplate What should I tell my care team before I take this medication? They need to know if you have any of these conditions: Blood clots Myelodysplastic syndrome An unusual or allergic reaction to romiplostim, mannitol, other medications, foods, dyes, or preservatives Pregnant or trying to get pregnant Breast-feeding How should I use this medication? This medication is injected under the skin. It is given by a care team in a hospital or clinic setting. A special MedGuide will be given to you before each treatment. Be sure to read this information carefully each time. Talk to your care team about the use of this medication in children. While it may be prescribed for children as young as newborns for selected conditions, precautions do apply. Overdosage: If you think you have taken too much of this medicine contact a poison control center or emergency room at once. NOTE: This medicine is only for you. Do not share this medicine with others. What if I miss a dose? Keep appointments for follow-up doses. It is important not to miss your dose. Call your care team if you are unable to keep an appointment. What may interact with this medication? Interactions are not expected. This list may not describe all possible interactions. Give your health care provider a list of all the medicines, herbs, non-prescription drugs, or dietary supplements you use. Also  tell them if you smoke, drink alcohol, or use illegal drugs. Some items may interact with your medicine. What should I watch for while using this medication? Visit your care team for regular checks on your progress. You may need blood work done while you are taking this medication. Your condition will be monitored carefully while you are receiving this medication. It is important not to miss any appointments. What side effects may I notice from receiving this medication? Side effects that you should report to your care team as soon as possible: Allergic reactions--skin rash, itching, hives, swelling of the face, lips, tongue, or throat Blood clot--pain, swelling, or warmth in the leg, shortness of breath, chest pain Side effects that usually do not require medical attention (report to your care team if they continue or are bothersome): Dizziness Joint pain Muscle pain Pain in the hands or feet Stomach pain Trouble sleeping This list may not describe all possible side effects. Call your doctor for medical advice about side effects. You may report side effects to FDA at 1-800-FDA-1088. Where should I keep my medication? This medication is given in a hospital or clinic. It will not be stored at home. NOTE: This sheet is a summary. It may not cover all possible information. If you have questions about this medicine, talk to your doctor, pharmacist, or health care provider.  2024 Elsevier/Gold Standard (2021-05-19 00:00:00)

## 2023-06-04 LAB — IRON AND IRON BINDING CAPACITY (CC-WL,HP ONLY)
Iron: 100 ug/dL (ref 45–182)
Saturation Ratios: 29 % (ref 17.9–39.5)
TIBC: 344 ug/dL (ref 250–450)
UIBC: 244 ug/dL (ref 117–376)

## 2023-07-14 ENCOUNTER — Ambulatory Visit (HOSPITAL_COMMUNITY)
Admission: RE | Admit: 2023-07-14 | Discharge: 2023-07-14 | Disposition: A | Discharge status: Home / Self Care | Source: Ambulatory Visit | Attending: Surgery | Admitting: Surgery

## 2023-07-14 DIAGNOSIS — I7121 Aneurysm of the ascending aorta, without rupture: Secondary | ICD-10-CM | POA: Diagnosis not present

## 2023-07-14 DIAGNOSIS — N281 Cyst of kidney, acquired: Secondary | ICD-10-CM | POA: Diagnosis not present

## 2023-07-14 DIAGNOSIS — I2510 Atherosclerotic heart disease of native coronary artery without angina pectoris: Secondary | ICD-10-CM | POA: Insufficient documentation

## 2023-07-14 MED ORDER — IOHEXOL 350 MG/ML SOLN
75.0000 mL | Freq: Once | INTRAVENOUS | Status: AC | PRN
  Administered 2023-07-14: 75 mL via INTRAVENOUS

## 2023-07-15 ENCOUNTER — Inpatient Hospital Stay

## 2023-07-15 ENCOUNTER — Inpatient Hospital Stay: Attending: Hematology & Oncology | Admitting: Medical Oncology

## 2023-07-15 VITALS — BP 136/72 | HR 55 | Temp 98.0°F | Resp 16 | Ht 69.0 in | Wt 184.7 lb

## 2023-07-15 DIAGNOSIS — D693 Immune thrombocytopenic purpura: Secondary | ICD-10-CM

## 2023-07-15 DIAGNOSIS — Z79899 Other long term (current) drug therapy: Secondary | ICD-10-CM | POA: Diagnosis not present

## 2023-07-15 LAB — CMP (CANCER CENTER ONLY)
ALT: 9 U/L (ref 0–44)
AST: 23 U/L (ref 15–41)
Albumin: 4.7 g/dL (ref 3.5–5.0)
Alkaline Phosphatase: 41 U/L (ref 38–126)
Anion gap: 11 (ref 5–15)
BUN: 23 mg/dL (ref 8–23)
CO2: 26 mmol/L (ref 22–32)
Calcium: 9.5 mg/dL (ref 8.9–10.3)
Chloride: 101 mmol/L (ref 98–111)
Creatinine: 1.66 mg/dL — ABNORMAL HIGH (ref 0.61–1.24)
GFR, Estimated: 43 mL/min — ABNORMAL LOW (ref >60)
Glucose, Bld: 95 mg/dL (ref 70–99)
Potassium: 3.7 mmol/L (ref 3.5–5.1)
Sodium: 138 mmol/L (ref 135–145)
Total Bilirubin: 0.7 mg/dL (ref 0.0–1.2)
Total Protein: 7.5 g/dL (ref 6.5–8.1)

## 2023-07-15 LAB — CBC WITH DIFFERENTIAL (CANCER CENTER ONLY)
Abs Immature Granulocytes: 0.06 10*3/uL (ref 0.00–0.07)
Basophils Absolute: 0 10*3/uL (ref 0.0–0.1)
Basophils Relative: 0 %
Eosinophils Absolute: 0.2 10*3/uL (ref 0.0–0.5)
Eosinophils Relative: 3 %
HCT: 32.9 % — ABNORMAL LOW (ref 39.0–52.0)
Hemoglobin: 10.7 g/dL — ABNORMAL LOW (ref 13.0–17.0)
Immature Granulocytes: 1 %
Lymphocytes Relative: 17 %
Lymphs Abs: 1 10*3/uL (ref 0.7–4.0)
MCH: 30.7 pg (ref 26.0–34.0)
MCHC: 32.5 g/dL (ref 30.0–36.0)
MCV: 94.3 fL (ref 80.0–100.0)
Monocytes Absolute: 0.5 10*3/uL (ref 0.1–1.0)
Monocytes Relative: 9 %
Neutro Abs: 4.3 10*3/uL (ref 1.7–7.7)
Neutrophils Relative %: 70 %
Platelet Count: 64 10*3/uL — ABNORMAL LOW (ref 150–400)
RBC: 3.49 MIL/uL — ABNORMAL LOW (ref 4.22–5.81)
RDW: 13.7 % (ref 11.5–15.5)
WBC Count: 6.1 10*3/uL (ref 4.0–10.5)
nRBC: 0 % (ref 0.0–0.2)

## 2023-07-15 LAB — SAVE SMEAR(SSMR), FOR PROVIDER SLIDE REVIEW

## 2023-07-15 LAB — LACTATE DEHYDROGENASE: LDH: 254 U/L — ABNORMAL HIGH (ref 98–192)

## 2023-07-15 MED ORDER — ROMIPLOSTIM 125 MCG ~~LOC~~ SOLR
95.0000 ug | Freq: Once | SUBCUTANEOUS | Status: AC
  Administered 2023-07-15: 95 ug via SUBCUTANEOUS
  Filled 2023-07-15: qty 0.19

## 2023-07-15 NOTE — Progress Notes (Signed)
 Hematology and Oncology Follow Up Visit  Lonnie Hansen [id number] Jan 19, 1951 73 y.o. 07/15/2023   Principle Diagnosis:  Chronic ITP   Current Therapy:        Nplate  to keep platelet count over 100,000   Interim History:  Lonnie Hansen is here today for follow-up and Nplate .  He states that he has been well.   There is been no problems with bleeding.  He has had no bruising.  He has had no change in bowel or bladder habits.  His appetite is doing all right.  He has had no issues with cough or shortness of breath.  Still holding off on his hip surgery. He wants to try to hold off on this as long as possible.  He is a little bit worried about have to go back to thoracic surgery.  He needs a CAT scan to assess for any change in his thoracic aortic aneurysm.  Overall, I would say that his performance status is probably ECOG 1.   Wt Readings from Last 3 Encounters:  07/15/23 184 lb 11.2 oz (83.8 kg)  06/03/23 186 lb (84.4 kg)  05/26/23 182 lb (82.6 kg)     Medications:  Allergies as of 07/15/2023   No Known Allergies      Medication List        Accurate as of July 15, 2023  2:36 PM. If you have any questions, ask your nurse or doctor.          amLODipine  10 MG tablet Commonly known as: NORVASC  Take 10 mg by mouth daily.   atenolol  25 MG tablet Commonly known as: TENORMIN  Take 12.5 mg by mouth 2 (two) times daily.   fluticasone  50 MCG/ACT nasal spray Commonly known as: FLONASE  Place 1 spray into both nostrils daily.   hydrocortisone  cream 1 % Apply topically 2 (two) times daily.   latanoprost  0.005 % ophthalmic solution Commonly known as: XALATAN  Place 1 drop into both eyes 2 (two) times daily.   losartan -hydrochlorothiazide  100-12.5 MG tablet Commonly known as: HYZAAR Take 0.5 tablets by mouth daily. 1/2 tablet in the morning, 1/2 tablet at night time.   multivitamin with minerals Tabs tablet Take 1 tablet by mouth daily.   pantoprazole  40 MG  tablet Commonly known as: PROTONIX  Take 40 mg by mouth 2 (two) times daily.   rosuvastatin  20 MG tablet Commonly known as: CRESTOR  Take 20 mg by mouth daily.   tamsulosin 0.4 MG Caps capsule Commonly known as: FLOMAX Take 0.4 mg by mouth at bedtime.        Allergies: No Known Allergies  Past Medical History, Surgical history, Social history, and Family History were reviewed and updated.  Review of Systems: Review of Systems  Constitutional: Negative.   HENT: Negative.    Eyes: Negative.   Respiratory: Negative.    Cardiovascular: Negative.   Gastrointestinal: Negative.   Genitourinary: Negative.   Musculoskeletal: Negative.   Skin: Negative.   Neurological: Negative.   Endo/Heme/Allergies: Negative.   Psychiatric/Behavioral: Negative.       Physical Exam:  Vitals:   07/15/23 1423  BP: 136/72  Pulse: (!) 55  Resp: 16  Temp: 98 F (36.7 C)  SpO2: 100%     Wt Readings from Last 3 Encounters:  07/15/23 184 lb 11.2 oz (83.8 kg)  06/03/23 186 lb (84.4 kg)  05/26/23 182 lb (82.6 kg)    Physical Exam Vitals reviewed.  HENT:     Head: Normocephalic and atraumatic.   Eyes:  Pupils: Pupils are equal, round, and reactive to light.    Cardiovascular:     Rate and Rhythm: Normal rate and regular rhythm.     Heart sounds: Normal heart sounds.  Pulmonary:     Effort: Pulmonary effort is normal.     Breath sounds: Normal breath sounds.  Abdominal:     General: Bowel sounds are normal.     Palpations: Abdomen is soft.   Musculoskeletal:        General: No tenderness or deformity. Normal range of motion.     Cervical back: Normal range of motion.  Lymphadenopathy:     Cervical: No cervical adenopathy.   Skin:    General: Skin is warm and dry.     Findings: No erythema or rash.   Neurological:     Mental Status: He is alert and oriented to person, place, and time.   Psychiatric:        Behavior: Behavior normal.        Thought Content: Thought  content normal.        Judgment: Judgment normal.       Lab Results  Component Value Date   WBC 6.1 07/15/2023   HGB 10.7 (L) 07/15/2023   HCT 32.9 (L) 07/15/2023   MCV 94.3 07/15/2023   PLT 64 (L) 07/15/2023   Lab Results  Component Value Date   FERRITIN 108 06/03/2023   IRON  100 06/03/2023   TIBC 344 06/03/2023   UIBC 244 06/03/2023   IRONPCTSAT 29 06/03/2023   Lab Results  Component Value Date   RETICCTPCT 4.2 (H) 10/12/2022   RBC 3.49 (L) 07/15/2023   No results found for: KPAFRELGTCHN, LAMBDASER, KAPLAMBRATIO No results found for: IGGSERUM, IGA, IGMSERUM No results found for: Hobson Luna, SPEI   Chemistry      Component Value Date/Time   NA 141 06/03/2023 1429   NA 140 05/11/2019 0946   K 4.4 06/03/2023 1429   CL 103 06/03/2023 1429   CO2 29 06/03/2023 1429   BUN 22 06/03/2023 1429   BUN 17 05/11/2019 0946   CREATININE 1.76 (H) 06/03/2023 1429   CREATININE 0.75 04/10/2016 1132      Component Value Date/Time   CALCIUM  9.8 06/03/2023 1429   ALKPHOS 48 06/03/2023 1429   AST 19 06/03/2023 1429   ALT 9 06/03/2023 1429   BILITOT 0.8 06/03/2023 1429      Encounter Diagnosis  Name Primary?   Chronic ITP (idiopathic thrombocytopenia) (HCC) Yes    Impression and Plan: Lonnie Hansen is a very pleasant 73 yo African American gentleman with chronic ITP.  His platelet count is on the lower side.  However, it is stable.  He is asymptomatic.  We will go ahead and give him Nplate .  Platelets today are 70 which are stable.    Nplate  to be administered today  RTC 2 months APP, labs(CBC w, CMP, LDH), Nplate     Sharla Davis, PA-C 6/19/20252:36 PM

## 2023-07-15 NOTE — Patient Instructions (Signed)
 Romiplostim Injection What is this medication? ROMIPLOSTIM (roe mi PLOE stim) treats low levels of platelets in your body caused by immune thrombocytopenia (ITP). It is prescribed when other medications have not worked or cannot be tolerated. It may also be used to help people who have been exposed to high doses of radiation. It works by increasing the amount of platelets in your blood. This lowers the risk of bleeding. This medicine may be used for other purposes; ask your health care provider or pharmacist if you have questions. COMMON BRAND NAME(S): Nplate What should I tell my care team before I take this medication? They need to know if you have any of these conditions: Blood clots Myelodysplastic syndrome An unusual or allergic reaction to romiplostim, mannitol, other medications, foods, dyes, or preservatives Pregnant or trying to get pregnant Breast-feeding How should I use this medication? This medication is injected under the skin. It is given by a care team in a hospital or clinic setting. A special MedGuide will be given to you before each treatment. Be sure to read this information carefully each time. Talk to your care team about the use of this medication in children. While it may be prescribed for children as young as newborns for selected conditions, precautions do apply. Overdosage: If you think you have taken too much of this medicine contact a poison control center or emergency room at once. NOTE: This medicine is only for you. Do not share this medicine with others. What if I miss a dose? Keep appointments for follow-up doses. It is important not to miss your dose. Call your care team if you are unable to keep an appointment. What may interact with this medication? Interactions are not expected. This list may not describe all possible interactions. Give your health care provider a list of all the medicines, herbs, non-prescription drugs, or dietary supplements you use. Also  tell them if you smoke, drink alcohol, or use illegal drugs. Some items may interact with your medicine. What should I watch for while using this medication? Visit your care team for regular checks on your progress. You may need blood work done while you are taking this medication. Your condition will be monitored carefully while you are receiving this medication. It is important not to miss any appointments. What side effects may I notice from receiving this medication? Side effects that you should report to your care team as soon as possible: Allergic reactions--skin rash, itching, hives, swelling of the face, lips, tongue, or throat Blood clot--pain, swelling, or warmth in the leg, shortness of breath, chest pain Side effects that usually do not require medical attention (report to your care team if they continue or are bothersome): Dizziness Joint pain Muscle pain Pain in the hands or feet Stomach pain Trouble sleeping This list may not describe all possible side effects. Call your doctor for medical advice about side effects. You may report side effects to FDA at 1-800-FDA-1088. Where should I keep my medication? This medication is given in a hospital or clinic. It will not be stored at home. NOTE: This sheet is a summary. It may not cover all possible information. If you have questions about this medicine, talk to your doctor, pharmacist, or health care provider.  2024 Elsevier/Gold Standard (2021-05-19 00:00:00)

## 2023-07-21 ENCOUNTER — Encounter: Payer: Self-pay | Admitting: Surgery

## 2023-07-21 ENCOUNTER — Ambulatory Visit: Attending: Surgery | Admitting: Surgery

## 2023-07-21 VITALS — BP 138/76 | HR 59 | Resp 18 | Ht 69.0 in | Wt 186.0 lb

## 2023-07-21 DIAGNOSIS — I7121 Aneurysm of the ascending aorta, without rupture: Secondary | ICD-10-CM | POA: Insufficient documentation

## 2023-07-21 NOTE — Progress Notes (Signed)
 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 Army on 09/15/2016 using an Edwards model 3300 TFX 29 mm pericardial valve and a 32 mm Gelweave Valsalva graft.  He has been followed since for aortic surveillance. He has a history of abdominal aortic aneurysm and underwent endovascular aneurysm repair and coil embolization of the right hypogastric artery for aneurysm disease by Dr. Sheree on 11/28/2021. He underwent stenting of the left hypogastric artery for an enlarging aneurysm by Dr. Sheree on 10/05/2022. This was complicated by postop bleeding and required open exploration of the left CFA with repair and stenting of the left hypogastric artery and open exploration of the left common and external iliac arteries via a retroperitoneal incision. He has a history of ITP with GI bleeding and underwent coil embolization of the gastroduodenal artery and replaced right hepatic artery by IR on 08/19/2021. He has recovered from all of that and continues to work as a Administrator, [employer]. He feels well overall.  Current Outpatient Medications  Medication Sig Dispense Refill   amLODipine  (NORVASC ) 10 MG tablet Take 10 mg by mouth daily.     atenolol  (TENORMIN ) 25 MG tablet Take 12.5 mg by mouth 2 (two) times daily. 180 tablet 3   fluticasone  (FLONASE ) 50 MCG/ACT nasal spray Place 1 spray into both nostrils daily.     hydrocortisone  cream 1 % Apply topically 2 (two) times daily. 30 g 0   latanoprost  (XALATAN ) 0.005 % ophthalmic solution Place 1 drop into both eyes 2 (two) times daily.     losartan -hydrochlorothiazide  (HYZAAR) 100-12.5 MG tablet Take 0.5 tablets by mouth daily. 1/2 tablet in the morning, 1/2 tablet at night time.     Multiple Vitamin (MULTIVITAMIN WITH MINERALS) TABS tablet Take 1 tablet by  mouth daily.     pantoprazole  (PROTONIX ) 40 MG tablet Take 40 mg by mouth 2 (two) times daily.     rosuvastatin  (CRESTOR ) 20 MG tablet Take 20 mg by mouth daily.     tamsulosin (FLOMAX) 0.4 MG CAPS capsule Take 0.4 mg by mouth at bedtime.     No current facility-administered medications for this visit.     Physical Exam: BP 138/76   Pulse (!) 59   Resp 18   Ht 5' 9 (1.753 m)   Wt 186 lb (84.4 kg)   SpO2 98%   BMI 27.47 kg/m  He looks well Cardiac exam shows a regular rate and rhythm with normal heart sounds. There is no murmur Lungs are clear  Diagnostic Tests:  Narrative & Impression  CLINICAL DATA:  Aortic aneurysm suspected   EXAM: CT ANGIOGRAPHY CHEST WITH AND WITHOUT CONTRAST   TECHNIQUE: Non-contrast CT of the chest was initially obtained. Multidetector CT imaging through the chest was performed using the standard protocol during bolus administration of intravenous contrast. Multiplanar reconstructed images and MIPs were obtained and reviewed to evaluate the vascular anatomy.   RADIATION DOSE REDUCTION: This exam was performed according to the departmental dose-optimization program which includes automated exposure control, adjustment of the mA and/or kV according to patient size and/or use of iterative reconstruction technique.   CONTRAST:  75mL OMNIPAQUE  IOHEXOL  350 MG/ML SOLN   COMPARISON:  None Available.   FINDINGS: Cardiovascular:  No cardiomegaly or pericardial effusion. Redemonstrated aortic valve replacement. Overall, similar dilation of the ascending aorta and the aortic arch. For example, the aortic annulus measures 4.3 cm (previously, 4.1 cm, by my measurement). The origin of the left and right coronary arteries remain mildly dilated. The ascending aorta measures 5.3 cm with similar appearance of the partially thrombosed dissection along the right lateral aspect of the ascending aorta. The small dissection flap at the ostium of the right  brachiocephalic artery is also unchanged. The aortic arch is dilated proximally to a maximum of 5 cm (previously, 4.9 cm, by my measurement). The descending aorta measures 4 cm proximally (previously, 3.9 cm), and 3.2 cm distally (3.1 cm previously). No acute intramural hematoma. No periaortic fluid collection or inflammation. Multi-vessel coronary atherosclerosis.   Mediastinum/Nodes: No mediastinal mass. No mediastinal, hilar, or axillary lymphadenopathy.   Lungs/Pleura: The midline trachea and bronchi are patent. No focal airspace consolidation, pleural effusion, or pneumothorax. Posterior bibasilar dependent atelectasis.   Musculoskeletal: No acute fracture or destructive bone lesion. Sternotomy wires. No sternal dehiscence. Multilevel degenerative disc disease of the spine. Small volume symmetric bilateral gynecomastia.   Upper Abdomen: No acute abnormality within the partially visualized upper abdomen. Partially visualized endovascular stent graft in the abdominal aorta with vascular coils in the upper abdomen. Cholecystectomy. Similar bilateral renal cysts.   Review of the MIP images confirms the above findings.   IMPRESSION: 1. Redemonstrated aortic valve replacement with similar appearance of the thoracic aortic aneurysm, measuring up to 5.3 cm in the ascending portion (with the previous maximal dimension measuring 5.2 cm in this region). Continued follow-up recommended, as documented below. 2. Unchanged, mild dilation of the ostia of both the left main and right coronary arteries. Fairly extensive multi-vessel coronary atherosclerosis. 3. No pneumonia, pulmonary edema, pleural effusion.   Ascending thoracic aortic aneurysm. Recommend semi-annual imaging followup by CTA or MRA and referral to cardiothoracic surgery if not already obtained. This recommendation follows 2010 ACCF/AHA/AATS/ACR/ASA/SCA/SCAI/SIR/STS/SVM Guidelines for the Diagnosis and Management of  Patients With Thoracic Aortic Disease. Circulation. 2010; 121: Z733-z630. Aortic aneurysm NOS (ICD10-I71.9)     Electronically Signed   By: Rogelia Myers M.D.   On: 07/14/2023 12:59      Impression:  His distal ascending aorta has slowly been enlarging over the past few years. It was 4.7 cm three years ago and now 5.3 cm. His aortic arch is 5 cm. 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 The false lumen is thrombosed. The descending thoracic aorta is stable at 4 cm.  I do not think there is any indication for surgical treatment at this time.  Surgical treatment of this would require replacement of the aortic arch back to the previous surgical anastomosis in the ascending aorta.  This would be a very high risk surgery for this 73 year old gentleman with multiple co-morbidities including ITP.  I reviewed the CTA images with him and answered all of his questions.  I stressed the importance of continued good blood pressure control in preventing further enlargement and recurrent aortic dissection.  I advised him against doing any heavy lifting or strenuous physical activity that may require a Valsalva maneuver and could suddenly raise his blood pressure to high levels.    Plan:  I will plan to see him back in 6 months with a CTA of the chest for aortic surveillance.     I spent 20 minutes performing this established patient evaluation  and > 50% of this time was spent face to face counseling and coordinating the care of this patient's aortic aneurysm.    Dorise MARLA Fellers, MD [facility] [phone]

## 2023-08-04 ENCOUNTER — Other Ambulatory Visit: Payer: Self-pay

## 2023-08-04 ENCOUNTER — Encounter (HOSPITAL_COMMUNITY): Payer: Self-pay

## 2023-08-04 ENCOUNTER — Emergency Department (HOSPITAL_COMMUNITY): Admission: EM | Admit: 2023-08-04 | Discharge: 2023-08-04 | Disposition: A | Discharge status: Home / Self Care

## 2023-08-04 DIAGNOSIS — R140 Abdominal distension (gaseous): Secondary | ICD-10-CM | POA: Diagnosis present

## 2023-08-04 DIAGNOSIS — K921 Melena: Secondary | ICD-10-CM | POA: Diagnosis not present

## 2023-08-04 DIAGNOSIS — K922 Gastrointestinal hemorrhage, unspecified: Secondary | ICD-10-CM | POA: Insufficient documentation

## 2023-08-04 DIAGNOSIS — Z8719 Personal history of other diseases of the digestive system: Secondary | ICD-10-CM | POA: Diagnosis not present

## 2023-08-04 DIAGNOSIS — R195 Other fecal abnormalities: Secondary | ICD-10-CM | POA: Diagnosis not present

## 2023-08-04 LAB — CBC WITH DIFFERENTIAL/PLATELET
Abs Immature Granulocytes: 0.04 K/uL (ref 0.00–0.07)
Basophils Absolute: 0 K/uL (ref 0.0–0.1)
Basophils Relative: 0 %
Eosinophils Absolute: 0 K/uL (ref 0.0–0.5)
Eosinophils Relative: 0 %
HCT: 34.9 % — ABNORMAL LOW (ref 39.0–52.0)
Hemoglobin: 11.5 g/dL — ABNORMAL LOW (ref 13.0–17.0)
Immature Granulocytes: 0 %
Lymphocytes Relative: 8 %
Lymphs Abs: 0.8 K/uL (ref 0.7–4.0)
MCH: 31.3 pg (ref 26.0–34.0)
MCHC: 33 g/dL (ref 30.0–36.0)
MCV: 95.1 fL (ref 80.0–100.0)
Monocytes Absolute: 0.6 K/uL (ref 0.1–1.0)
Monocytes Relative: 6 %
Neutro Abs: 8.3 K/uL — ABNORMAL HIGH (ref 1.7–7.7)
Neutrophils Relative %: 86 %
Platelets: 86 K/uL — ABNORMAL LOW (ref 150–400)
RBC: 3.67 MIL/uL — ABNORMAL LOW (ref 4.22–5.81)
RDW: 13.1 % (ref 11.5–15.5)
WBC: 9.8 K/uL (ref 4.0–10.5)
nRBC: 0 % (ref 0.0–0.2)

## 2023-08-04 LAB — COMPREHENSIVE METABOLIC PANEL WITH GFR
ALT: 13 U/L (ref 0–44)
AST: 25 U/L (ref 15–41)
Albumin: 4.6 g/dL (ref 3.5–5.0)
Alkaline Phosphatase: 41 U/L (ref 38–126)
Anion gap: 14 (ref 5–15)
BUN: 25 mg/dL — ABNORMAL HIGH (ref 8–23)
CO2: 20 mmol/L — ABNORMAL LOW (ref 22–32)
Calcium: 9.6 mg/dL (ref 8.9–10.3)
Chloride: 103 mmol/L (ref 98–111)
Creatinine, Ser: 1.58 mg/dL — ABNORMAL HIGH (ref 0.61–1.24)
GFR, Estimated: 46 mL/min — ABNORMAL LOW (ref >60)
Glucose, Bld: 97 mg/dL (ref 70–99)
Potassium: 3.9 mmol/L (ref 3.5–5.1)
Sodium: 137 mmol/L (ref 135–145)
Total Bilirubin: 1.3 mg/dL — ABNORMAL HIGH (ref 0.0–1.2)
Total Protein: 7.9 g/dL (ref 6.5–8.1)

## 2023-08-04 LAB — PROTIME-INR
INR: 1.2 (ref 0.8–1.2)
Prothrombin Time: 15.3 s — ABNORMAL HIGH (ref 11.4–15.2)

## 2023-08-04 LAB — POC OCCULT BLOOD, ED: Fecal Occult Bld: POSITIVE — AB

## 2023-08-04 NOTE — ED Provider Triage Note (Signed)
 Emergency Medicine Provider Triage Evaluation Note  Lonnie Hansen , a 73 y.o. male  was evaluated in triage.  Pt complains of melena. Reports symptoms began yesterday. Denies abdominal pain. Hx of chronic ITP, gets regular Nplate  injections, last injection was in June. Went to [facility] this morning and had positive hemoccult and was sent here for evaluation.   Review of Systems  Positive:  Negative:   Physical Exam  BP (!) 150/80 (BP Location: Left Arm)   Pulse 63   Temp (!) 97.5 F (36.4 C)   Resp 16   Ht 5' 8 (1.727 m)   Wt 82.6 kg   SpO2 98%   BMI 27.67 kg/m  Gen:   Awake, no distress   Resp:  Normal effort  MSK:   Moves extremities without difficulty  Other:    Medical Decision Making  Medically screening exam initiated at 12:27 PM.  Appropriate orders placed.  Lonnie Hansen was informed that the remainder of the evaluation will be completed by another provider, this initial triage assessment does not replace that evaluation, and the importance of remaining in the ED until their evaluation is complete.     Lonnie Lauraine LABOR, PA-C 08/04/23 1232

## 2023-08-04 NOTE — ED Notes (Signed)
 Awaiting patient from lobby.

## 2023-08-04 NOTE — Discharge Instructions (Addendum)
 Your laboratories also are within normal limits today.  Will need to return to [facility] in 2 days in order to obtain a repeat hemoglobin check.  Please call their office today in order to have this appointment scheduled.

## 2023-08-04 NOTE — ED Provider Notes (Signed)
 [place] EMERGENCY DEPARTMENT AT [facility] Provider Note   CSN: [id number] Arrival date & time: 08/04/23  1203     Patient presents with: GI Problem   Lonnie Hansen is a 73 y.o. male.   73 y.o male with a PMH of ITP, GI bleed presents to the ED sent in by [facility] office for rectal bleeding that has been ongoing since yesterday.  Patient reports he noticed his [place] to be very distended, he felt like he had a lot of gas, then went to have a bowel movement while sitting on the toilet he noticed a large amount of black tarry stool.  He has had a total of 3 bowel movements since this occurred.  He does report history of GI bleed in 2023 where he had to have intervention.  He was evaluated at [facility] clinic today, and they sent him in as they are concerned for an upper GI bleed this time.  He does take Protonix  daily, only half in the morning and half at night.  He does report ongoing history of ITP, unfortunately due to insurance strain, he is unable to be on a platelet stabilizer continuously therefore his platelets are usually very low.  He has been transfused in the past.  Feels somewhat fatigued but no shortness of breath, no weakness, no other complaints reported.  The history is provided by the patient.  GI Problem This is a new problem. The current episode started yesterday. The problem occurs constantly. The problem has not changed since onset.Associated symptoms include abdominal pain. Pertinent negatives include no chest pain, no headaches and no shortness of breath. Nothing aggravates the symptoms. Nothing relieves the symptoms. He has tried nothing for the symptoms.       Prior to Admission medications   Medication Sig Start Date End Date Taking? Authorizing Provider  amLODipine  (NORVASC ) 10 MG tablet Take 10 mg by mouth daily. 11/12/22   [provider]  atenolol  (TENORMIN ) 25 MG tablet Take 12.5 mg by mouth 2 (two) times daily. 08/26/20   Anner Alm ORN, MD   fluticasone  (FLONASE ) 50 MCG/ACT nasal spray Place 1 spray into both nostrils daily. 02/18/23   [provider]  hydrocortisone  cream 1 % Apply topically 2 (two) times daily. 10/09/22   Gherghe, Costin M, MD  latanoprost  (XALATAN ) 0.005 % ophthalmic solution Place 1 drop into both eyes 2 (two) times daily.    [provider]  losartan -hydrochlorothiazide  (HYZAAR) 100-12.5 MG tablet Take 0.5 tablets by mouth daily. 1/2 tablet in the morning, 1/2 tablet at night time.    [provider]  Multiple Vitamin (MULTIVITAMIN WITH MINERALS) TABS tablet Take 1 tablet by mouth daily.    [provider]  pantoprazole  (PROTONIX ) 40 MG tablet Take 40 mg by mouth 2 (two) times daily. 11/09/20   [provider]  rosuvastatin  (CRESTOR ) 20 MG tablet Take 20 mg by mouth daily.    [provider]  tamsulosin (FLOMAX) 0.4 MG CAPS capsule Take 0.4 mg by mouth at bedtime. 12/09/22   [provider]    Allergies: Patient has no known allergies.    Review of Systems  Constitutional:  Negative for chills and fever.  HENT:  Negative for sore throat.   Respiratory:  Negative for shortness of breath.   Cardiovascular:  Negative for chest pain.  Gastrointestinal:  Positive for abdominal pain, blood in stool and nausea. Negative for vomiting.  Genitourinary:  Negative for difficulty urinating and frequency.  Musculoskeletal:  Negative for back pain.  Neurological:  Positive for dizziness and weakness. Negative for light-headedness and headaches.  All other systems reviewed and are negative.   Updated Vital Signs BP 114/73   Pulse (!) 51   Temp (!) 97.5 F (36.4 C)   Resp 16   Ht 5' 8 (1.727 m)   Wt 82.6 kg   SpO2 100%   BMI 27.67 kg/m   Physical Exam Vitals and nursing note reviewed. Exam conducted with a chaperone present.  Constitutional:      Appearance: Normal appearance.  HENT:     Head: Normocephalic and atraumatic.     Mouth/Throat:      Mouth: Mucous membranes are moist.  Eyes:     Comments: Conjunctiva does appear somewhat pale.  Cardiovascular:     Rate and Rhythm: Normal rate.  Pulmonary:     Effort: Pulmonary effort is normal.     Breath sounds: No wheezing.  Abdominal:     General: Abdomen is flat. There is distension.     Palpations: Abdomen is soft.     Comments: Abdomen is distended, bowel sounds are diminished, no focal tenderness.  Musculoskeletal:     Cervical back: Normal range of motion and neck supple.  Skin:    General: Skin is warm and dry.  Neurological:     Mental Status: He is alert and oriented to person, place, and time.     (all labs ordered are listed, but only abnormal results are displayed) Labs Reviewed  COMPREHENSIVE METABOLIC PANEL WITH GFR - Abnormal; Notable for the following components:      Result Value   CO2 20 (*)    BUN 25 (*)    Creatinine, Ser 1.58 (*)    Total Bilirubin 1.3 (*)    GFR, Estimated 46 (*)    All other components within normal limits  CBC WITH DIFFERENTIAL/PLATELET - Abnormal; Notable for the following components:   RBC 3.67 (*)    Hemoglobin 11.5 (*)    HCT 34.9 (*)    Platelets 86 (*)    Neutro Abs 8.3 (*)    All other components within normal limits  PROTIME-INR - Abnormal; Notable for the following components:   Prothrombin Time 15.3 (*)    All other components within normal limits  POC OCCULT BLOOD, ED - Abnormal; Notable for the following components:   Fecal Occult Bld POSITIVE (*)    All other components within normal limits  TYPE AND SCREEN    EKG: None  Radiology: No results found.   Procedures   Medications Ordered in the ED - No data to display  Clinical Course as of 08/04/23 1510  Wed Aug 04, 2023  1350 Fecal Occult Blood, POC(!): POSITIVE [JS]    Clinical Course User Index [JS] Jenna Ardoin, PA-C                                 Medical Decision Making Amount and/or Complexity of Data Reviewed Labs:  Decision-making  details documented in ED Course.        This patient presents to the ED for concern of rectal bleeding, this involves a number of treatment options, and is a complaint that carries with it a high risk of complications and morbidity.  The differential diagnosis includes diverticula bleed, diverticulitis, upper versus lower GI bleed.    Co morbidities: Discussed in HPI   Brief History:  See HPI.  EMR reviewed including pt PMHx, past surgical history and past visits to ER.   See HPI for more details   Lab Tests:  I ordered and independently interpreted labs.  The pertinent results include:    CBC with no leukocytosis, hemoglobin slightly decreased at 11.5, and stable.  CMP with no electrolyte abnormality, slight elevation to his creatinine however around the same with prior levels.  Platelets today are 1, consistent with his prior history of low platelets and ITP.  Hemoccult positive while in the ED.   Imaging Studies:  No imaging studies ordered for this patient  Medicines ordered:  N/A  Consults:  I requested consultation with Dr. Oneil Boots,  and discussed lab and imaging findings as well as pertinent plan - they recommend:   Reevaluation:  After the interventions noted above I re-evaluated patient and found that they have :stayed the same  Social Determinants of Health:  The patient's social determinants of health were a factor in the care of this patient  Problem List / ED Course:  Patient presented to the ED with a chief complaint of his toe for the past 24 hours.  Reports he had 3 bowel movements that contained blood.  Evaluated by [facility] today, was sent to the hospital in order to obtain further evaluation.  Here his Hemoccult is positive.  He is hemodynamically stable.  He is endorsing some abdominal distention, feels like every time he passes gas small clots come out.  His hemoglobin today is normal, he does have underlying history of ITP, this is 86.   Addition of his blood reveals CBC with no leukocytosis, the rest of it is as previously described.  CMP with no electrolyte derangement, creatinine is slightly elevated but at his baseline.  I did not obtain any CT imaging as there is no focal point of tenderness on his abdomen.  I did discuss this case with Urology Dr. Magod recommended patient to follow-up on outpatient basis as he recently had a colonoscopy 2 years ago, will need an H&H repeat in office on Friday. Discussed this plan with patient, patient is concerned as he feels like every time he passes gas he likely bleeds, he will try to pass gas here or bowel movement and may require admission.   Dispostion:  Patient care signed out to incoming team pending disposition.     Final diagnoses:  Gastrointestinal hemorrhage, unspecified gastrointestinal hemorrhage type    ED Discharge Orders     None          Jorita Bohanon, PA-C 08/04/23 1510    Kammerer, Megan L, DO 08/07/23 [phone]

## 2023-08-04 NOTE — ED Provider Notes (Signed)
 Patient's care assumed by me at 3:00 PM.  Patient has a past medical history of ITP.  Patient is here for evaluation of rectal bleeding.  Patient had normal laboratory evaluation GI has been consulted and will follow patient up in the office in 2 days.  Patient is pending discharge if he does not have any further bleeding. Patient reevaluated at 4:30 PM.  He has been passing gas he has not had any more bleeding.  Patient is ready to go home.  Patient discharged in stable condition   Grisela Mesch K, PA-C 08/04/23 1639    Melvenia Motto, MD 08/05/23 [phone]

## 2023-08-04 NOTE — ED Triage Notes (Signed)
 Pt here from home with c/o gi bleed that started yesterday

## 2023-08-06 DIAGNOSIS — K921 Melena: Secondary | ICD-10-CM | POA: Diagnosis not present

## 2023-08-07 LAB — BPAM RBC
Blood Product Expiration Date: 202507162359
Blood Product Expiration Date: 202507162359
Unit Type and Rh: 600
Unit Type and Rh: 600

## 2023-08-07 LAB — TYPE AND SCREEN
ABO/RH(D): AB POS
Antibody Screen: NEGATIVE
Donor AG Type: NEGATIVE
Donor AG Type: NEGATIVE
Unit division: 0
Unit division: 0

## 2023-08-16 ENCOUNTER — Encounter: Payer: Self-pay | Admitting: Orthopaedic Surgery

## 2023-08-16 ENCOUNTER — Other Ambulatory Visit (INDEPENDENT_AMBULATORY_CARE_PROVIDER_SITE_OTHER): Payer: Self-pay

## 2023-08-16 ENCOUNTER — Ambulatory Visit (INDEPENDENT_AMBULATORY_CARE_PROVIDER_SITE_OTHER): Admitting: Orthopaedic Surgery

## 2023-08-16 DIAGNOSIS — M25551 Pain in right hip: Secondary | ICD-10-CM | POA: Diagnosis not present

## 2023-08-16 DIAGNOSIS — M1611 Unilateral primary osteoarthritis, right hip: Secondary | ICD-10-CM

## 2023-08-16 NOTE — Progress Notes (Signed)
 Although the patient is listed as new, I have seen him several years ago.  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  His right hip is what is worse.  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  I have seen him remotely in the past and he is here today discussed the potential for total hip replacement in early January once the music season is done from all the holiday events.  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  He sees Dr. Timmy on a regular basis for this.  His most recent platelets were 89 just 12 days ago and that is the highest that has been.  His hemoglobin runs just below 12 recently.  On exam his right hip has almost no rotation.  There is significant pain with attempted rotation.  The left hip moves smoothly and fluidly.  X-rays of the right hip and pelvis show severe debilitating and stage arthritis of the right hip there is complete bone-on-bone wear and loss of the joint space with the hip almost in the subluxed position.  There is significant leg length difference.  His left hip joint space is normal.  At this point we will see him back in 3 months and at that visit can work on scheduling for a right joint replacement in early January if his health is still remaining stable.  He is followed by Dr. Sherrine from CT surgery as well for a thoracic aortic aneurysm that has not changed.  He is not on blood thinning medications either.  All questions concerns were answered and addressed.

## 2023-09-15 ENCOUNTER — Encounter: Payer: Self-pay | Admitting: Medical Oncology

## 2023-09-15 ENCOUNTER — Inpatient Hospital Stay

## 2023-09-15 ENCOUNTER — Inpatient Hospital Stay (HOSPITAL_BASED_OUTPATIENT_CLINIC_OR_DEPARTMENT_OTHER): Admitting: Medical Oncology

## 2023-09-15 ENCOUNTER — Inpatient Hospital Stay: Attending: Hematology & Oncology

## 2023-09-15 VITALS — BP 113/70 | HR 66 | Temp 98.8°F | Resp 19 | Ht 69.0 in | Wt 184.0 lb

## 2023-09-15 DIAGNOSIS — Z96641 Presence of right artificial hip joint: Secondary | ICD-10-CM | POA: Insufficient documentation

## 2023-09-15 DIAGNOSIS — Z79899 Other long term (current) drug therapy: Secondary | ICD-10-CM | POA: Insufficient documentation

## 2023-09-15 DIAGNOSIS — E7800 Pure hypercholesterolemia, unspecified: Secondary | ICD-10-CM | POA: Diagnosis not present

## 2023-09-15 DIAGNOSIS — D693 Immune thrombocytopenic purpura: Secondary | ICD-10-CM

## 2023-09-15 DIAGNOSIS — D509 Iron deficiency anemia, unspecified: Secondary | ICD-10-CM | POA: Insufficient documentation

## 2023-09-15 LAB — CBC WITH DIFFERENTIAL (CANCER CENTER ONLY)
Abs Immature Granulocytes: 0.09 K/uL — ABNORMAL HIGH (ref 0.00–0.07)
Basophils Absolute: 0 K/uL (ref 0.0–0.1)
Basophils Relative: 0 %
Eosinophils Absolute: 0.2 K/uL (ref 0.0–0.5)
Eosinophils Relative: 2 %
HCT: 32.6 % — ABNORMAL LOW (ref 39.0–52.0)
Hemoglobin: 10.8 g/dL — ABNORMAL LOW (ref 13.0–17.0)
Immature Granulocytes: 1 %
Lymphocytes Relative: 13 %
Lymphs Abs: 1.1 K/uL (ref 0.7–4.0)
MCH: 30.9 pg (ref 26.0–34.0)
MCHC: 33.1 g/dL (ref 30.0–36.0)
MCV: 93.4 fL (ref 80.0–100.0)
Monocytes Absolute: 0.8 K/uL (ref 0.1–1.0)
Monocytes Relative: 10 %
Neutro Abs: 5.8 K/uL (ref 1.7–7.7)
Neutrophils Relative %: 74 %
Platelet Count: 64 K/uL — ABNORMAL LOW (ref 150–400)
RBC: 3.49 MIL/uL — ABNORMAL LOW (ref 4.22–5.81)
RDW: 13.1 % (ref 11.5–15.5)
WBC Count: 8 K/uL (ref 4.0–10.5)
nRBC: 0 % (ref 0.0–0.2)

## 2023-09-15 LAB — CMP (CANCER CENTER ONLY)
ALT: 11 U/L (ref 0–44)
AST: 24 U/L (ref 15–41)
Albumin: 4.9 g/dL (ref 3.5–5.0)
Alkaline Phosphatase: 47 U/L (ref 38–126)
Anion gap: 13 (ref 5–15)
BUN: 23 mg/dL (ref 8–23)
CO2: 24 mmol/L (ref 22–32)
Calcium: 9.7 mg/dL (ref 8.9–10.3)
Chloride: 102 mmol/L (ref 98–111)
Creatinine: 1.5 mg/dL — ABNORMAL HIGH (ref 0.61–1.24)
GFR, Estimated: 49 mL/min — ABNORMAL LOW (ref >60)
Glucose, Bld: 111 mg/dL — ABNORMAL HIGH (ref 70–99)
Potassium: 4.1 mmol/L (ref 3.5–5.1)
Sodium: 138 mmol/L (ref 135–145)
Total Bilirubin: 0.6 mg/dL (ref 0.0–1.2)
Total Protein: 7.8 g/dL (ref 6.5–8.1)

## 2023-09-15 LAB — LACTATE DEHYDROGENASE: LDH: 258 U/L — ABNORMAL HIGH (ref 98–192)

## 2023-09-15 MED ORDER — ROMIPLOSTIM 125 MCG ~~LOC~~ SOLR
95.0000 ug | Freq: Once | SUBCUTANEOUS | Status: AC
  Administered 2023-09-15: 95 ug via SUBCUTANEOUS
  Filled 2023-09-15: qty 0.19

## 2023-09-15 NOTE — Progress Notes (Signed)
 Hematology and Oncology Follow Up Visit  Lonnie Hansen [id number] 10/06/1950 73 y.o. 09/15/2023   Principle Diagnosis:  Chronic ITP   Current Therapy:        Nplate  for platelets below 100,000   Interim History:  Lonnie Hansen is here today for follow-up and Nplate .  He states that he has been well. He has changed his diet recently to include more healthy options and less salt, sugar and high cholesterol items. He is going to the [facility] every day. He is prepping for his right hip replacement in January, 2026.  There is been no problems with bleeding.  He has had no bruising.  He has had no change in bowel or bladder habits.  His appetite is doing all right.  He has had no issues with cough or shortness of breath. Weight loss is intentional.   Overall, I would say that his performance status is probably ECOG 1.   Wt Readings from Last 3 Encounters:  09/15/23 184 lb (83.5 kg)  08/04/23 182 lb (82.6 kg)  07/21/23 186 lb (84.4 kg)     Medications:  Allergies as of 09/15/2023   No Known Allergies      Medication List        Accurate as of September 15, 2023  2:36 PM. If you have any questions, ask your nurse or doctor.          amLODipine  10 MG tablet Commonly known as: NORVASC  Take 10 mg by mouth daily.   Aspirin -Acetaminophen  500-325 MG Pack Take 1 packet by mouth daily as needed.   atenolol  25 MG tablet Commonly known as: TENORMIN  Take 12.5 mg by mouth 2 (two) times daily.   fluticasone  50 MCG/ACT nasal spray Commonly known as: FLONASE  Place 1 spray into both nostrils daily.   hydrocortisone  cream 1 % Apply topically 2 (two) times daily.   latanoprost  0.005 % ophthalmic solution Commonly known as: XALATAN  Place 1 drop into both eyes 2 (two) times daily.   losartan -hydrochlorothiazide  100-12.5 MG tablet Commonly known as: HYZAAR Take 0.5 tablets by mouth daily. 1/2 tablet in the morning, 1/2 tablet at night time.   multivitamin with minerals Tabs  tablet Take 1 tablet by mouth daily.   pantoprazole  40 MG tablet Commonly known as: PROTONIX  Take 40 mg by mouth 2 (two) times daily.   rosuvastatin  20 MG tablet Commonly known as: CRESTOR  Take 20 mg by mouth daily.   tamsulosin 0.4 MG Caps capsule Commonly known as: FLOMAX Take 0.4 mg by mouth at bedtime.        Allergies: No Known Allergies  Past Medical History, Surgical history, Social history, and Family History were reviewed and updated.  Review of Systems: Review of Systems  Constitutional: Negative.   HENT: Negative.    Eyes: Negative.   Respiratory: Negative.    Cardiovascular: Negative.   Gastrointestinal: Negative.   Genitourinary: Negative.   Musculoskeletal: Negative.   Skin: Negative.   Neurological: Negative.   Endo/Heme/Allergies: Negative.   Psychiatric/Behavioral: Negative.     Physical Exam:  Vitals:   09/15/23 1424  BP: 113/70  Pulse: 66  Resp: 19  Temp: 98.8 F (37.1 C)  SpO2: 100%    Wt Readings from Last 3 Encounters:  09/15/23 184 lb (83.5 kg)  08/04/23 182 lb (82.6 kg)  07/21/23 186 lb (84.4 kg)    Physical Exam Vitals reviewed.  HENT:     Head: Normocephalic and atraumatic.  Eyes:     Pupils: Pupils are equal,  round, and reactive to light.  Cardiovascular:     Rate and Rhythm: Normal rate and regular rhythm.     Heart sounds: Normal heart sounds.  Pulmonary:     Effort: Pulmonary effort is normal.     Breath sounds: Normal breath sounds.  Abdominal:     General: Bowel sounds are normal.     Palpations: Abdomen is soft.  Musculoskeletal:        General: No tenderness or deformity. Normal range of motion.     Cervical back: Normal range of motion.  Lymphadenopathy:     Cervical: No cervical adenopathy.  Skin:    General: Skin is warm and dry.     Findings: No erythema or rash.  Neurological:     Mental Status: He is alert and oriented to person, place, and time.  Psychiatric:        Behavior: Behavior normal.         Thought Content: Thought content normal.        Judgment: Judgment normal.       Lab Results  Component Value Date   WBC 8.0 09/15/2023   HGB 10.8 (L) 09/15/2023   HCT 32.6 (L) 09/15/2023   MCV 93.4 09/15/2023   PLT 64 (L) 09/15/2023   Lab Results  Component Value Date   FERRITIN 108 06/03/2023   IRON  100 06/03/2023   TIBC 344 06/03/2023   UIBC 244 06/03/2023   IRONPCTSAT 29 06/03/2023   Lab Results  Component Value Date   RETICCTPCT 4.2 (H) 10/12/2022   RBC 3.49 (L) 09/15/2023   No results found for: KPAFRELGTCHN, LAMBDASER, KAPLAMBRATIO No results found for: IGGSERUM, IGA, IGMSERUM No results found for: STEPHANY CARLOTA BENSON MARKEL EARLA JOANNIE DOC VICK, SPEI   Chemistry      Component Value Date/Time   NA 137 08/04/2023 1236   NA 140 05/11/2019 0946   K 3.9 08/04/2023 1236   CL 103 08/04/2023 1236   CO2 20 (L) 08/04/2023 1236   BUN 25 (H) 08/04/2023 1236   BUN 17 05/11/2019 0946   CREATININE 1.58 (H) 08/04/2023 1236   CREATININE 1.66 (H) 07/15/2023 1359   CREATININE 0.75 04/10/2016 1132      Component Value Date/Time   CALCIUM  9.6 08/04/2023 1236   ALKPHOS 41 08/04/2023 1236   AST 25 08/04/2023 1236   AST 23 07/15/2023 1359   ALT 13 08/04/2023 1236   ALT 9 07/15/2023 1359   BILITOT 1.3 (H) 08/04/2023 1236   BILITOT 0.7 07/15/2023 1359      Encounter Diagnoses  Name Primary?   Chronic ITP (idiopathic thrombocytopenia) (HCC) Yes   Iron  deficiency anemia, unspecified iron  deficiency anemia type     Impression and Plan: Lonnie Hansen is a very pleasant 73 yo African American gentleman with chronic ITP.  His platelet count is on the lower side.  However, it is stable.  He is asymptomatic.  Platelets today are 64   Nplate  to be administered today  RTC 1 months APP, labs(CBC w, CMP, LDH), Nplate     Lauraine CHRISTELLA Dais, PA-C 8/20/20252:36 PM

## 2023-09-15 NOTE — Patient Instructions (Signed)
 Romiplostim Injection What is this medication? ROMIPLOSTIM (roe mi PLOE stim) treats low levels of platelets in your body caused by immune thrombocytopenia (ITP). It is prescribed when other medications have not worked or cannot be tolerated. It may also be used to help people who have been exposed to high doses of radiation. It works by increasing the amount of platelets in your blood. This lowers the risk of bleeding. This medicine may be used for other purposes; ask your health care provider or pharmacist if you have questions. COMMON BRAND NAME(S): Nplate What should I tell my care team before I take this medication? They need to know if you have any of these conditions: Blood clots Myelodysplastic syndrome An unusual or allergic reaction to romiplostim, mannitol, other medications, foods, dyes, or preservatives Pregnant or trying to get pregnant Breast-feeding How should I use this medication? This medication is injected under the skin. It is given by a care team in a hospital or clinic setting. A special MedGuide will be given to you before each treatment. Be sure to read this information carefully each time. Talk to your care team about the use of this medication in children. While it may be prescribed for children as young as newborns for selected conditions, precautions do apply. Overdosage: If you think you have taken too much of this medicine contact a poison control center or emergency room at once. NOTE: This medicine is only for you. Do not share this medicine with others. What if I miss a dose? Keep appointments for follow-up doses. It is important not to miss your dose. Call your care team if you are unable to keep an appointment. What may interact with this medication? Interactions are not expected. This list may not describe all possible interactions. Give your health care provider a list of all the medicines, herbs, non-prescription drugs, or dietary supplements you use. Also  tell them if you smoke, drink alcohol, or use illegal drugs. Some items may interact with your medicine. What should I watch for while using this medication? Visit your care team for regular checks on your progress. You may need blood work done while you are taking this medication. Your condition will be monitored carefully while you are receiving this medication. It is important not to miss any appointments. What side effects may I notice from receiving this medication? Side effects that you should report to your care team as soon as possible: Allergic reactions--skin rash, itching, hives, swelling of the face, lips, tongue, or throat Blood clot--pain, swelling, or warmth in the leg, shortness of breath, chest pain Side effects that usually do not require medical attention (report to your care team if they continue or are bothersome): Dizziness Joint pain Muscle pain Pain in the hands or feet Stomach pain Trouble sleeping This list may not describe all possible side effects. Call your doctor for medical advice about side effects. You may report side effects to FDA at 1-800-FDA-1088. Where should I keep my medication? This medication is given in a hospital or clinic. It will not be stored at home. NOTE: This sheet is a summary. It may not cover all possible information. If you have questions about this medicine, talk to your doctor, pharmacist, or health care provider.  2024 Elsevier/Gold Standard (2021-05-19 00:00:00)

## 2023-09-17 DIAGNOSIS — H35033 Hypertensive retinopathy, bilateral: Secondary | ICD-10-CM | POA: Diagnosis not present

## 2023-09-17 DIAGNOSIS — H35362 Drusen (degenerative) of macula, left eye: Secondary | ICD-10-CM | POA: Diagnosis not present

## 2023-09-17 DIAGNOSIS — H04123 Dry eye syndrome of bilateral lacrimal glands: Secondary | ICD-10-CM | POA: Diagnosis not present

## 2023-09-17 DIAGNOSIS — H401231 Low-tension glaucoma, bilateral, mild stage: Secondary | ICD-10-CM | POA: Diagnosis not present

## 2023-10-04 DIAGNOSIS — K625 Hemorrhage of anus and rectum: Secondary | ICD-10-CM | POA: Diagnosis not present

## 2023-10-04 DIAGNOSIS — G4733 Obstructive sleep apnea (adult) (pediatric): Secondary | ICD-10-CM | POA: Diagnosis not present

## 2023-10-04 DIAGNOSIS — I2510 Atherosclerotic heart disease of native coronary artery without angina pectoris: Secondary | ICD-10-CM | POA: Diagnosis not present

## 2023-10-04 DIAGNOSIS — D693 Immune thrombocytopenic purpura: Secondary | ICD-10-CM | POA: Diagnosis not present

## 2023-10-04 DIAGNOSIS — I10 Essential (primary) hypertension: Secondary | ICD-10-CM | POA: Diagnosis not present

## 2023-10-04 DIAGNOSIS — Z23 Encounter for immunization: Secondary | ICD-10-CM | POA: Diagnosis not present

## 2023-10-18 ENCOUNTER — Inpatient Hospital Stay: Attending: Hematology & Oncology

## 2023-10-18 ENCOUNTER — Encounter: Payer: Self-pay | Admitting: Medical Oncology

## 2023-10-18 ENCOUNTER — Inpatient Hospital Stay

## 2023-10-18 ENCOUNTER — Inpatient Hospital Stay (HOSPITAL_BASED_OUTPATIENT_CLINIC_OR_DEPARTMENT_OTHER): Admitting: Medical Oncology

## 2023-10-18 VITALS — BP 127/67 | HR 55 | Temp 98.2°F | Resp 19 | Ht 69.0 in | Wt 184.0 lb

## 2023-10-18 DIAGNOSIS — D693 Immune thrombocytopenic purpura: Secondary | ICD-10-CM | POA: Diagnosis not present

## 2023-10-18 DIAGNOSIS — D509 Iron deficiency anemia, unspecified: Secondary | ICD-10-CM

## 2023-10-18 DIAGNOSIS — N281 Cyst of kidney, acquired: Secondary | ICD-10-CM | POA: Diagnosis not present

## 2023-10-18 DIAGNOSIS — N401 Enlarged prostate with lower urinary tract symptoms: Secondary | ICD-10-CM | POA: Diagnosis not present

## 2023-10-18 DIAGNOSIS — R351 Nocturia: Secondary | ICD-10-CM | POA: Diagnosis not present

## 2023-10-18 DIAGNOSIS — R310 Gross hematuria: Secondary | ICD-10-CM | POA: Diagnosis not present

## 2023-10-18 LAB — CBC
HCT: 33.5 % — ABNORMAL LOW (ref 39.0–52.0)
Hemoglobin: 11.3 g/dL — ABNORMAL LOW (ref 13.0–17.0)
MCH: 30.8 pg (ref 26.0–34.0)
MCHC: 33.7 g/dL (ref 30.0–36.0)
MCV: 91.3 fL (ref 80.0–100.0)
Platelets: 65 K/uL — ABNORMAL LOW (ref 150–400)
RBC: 3.67 MIL/uL — ABNORMAL LOW (ref 4.22–5.81)
RDW: 13.2 % (ref 11.5–15.5)
WBC: 9.8 K/uL (ref 4.0–10.5)
nRBC: 0 % (ref 0.0–0.2)

## 2023-10-18 LAB — CMP (CANCER CENTER ONLY)
ALT: 9 U/L (ref 0–44)
AST: 24 U/L (ref 15–41)
Albumin: 4.9 g/dL (ref 3.5–5.0)
Alkaline Phosphatase: 50 U/L (ref 38–126)
Anion gap: 13 (ref 5–15)
BUN: 22 mg/dL (ref 8–23)
CO2: 26 mmol/L (ref 22–32)
Calcium: 9.8 mg/dL (ref 8.9–10.3)
Chloride: 102 mmol/L (ref 98–111)
Creatinine: 1.78 mg/dL — ABNORMAL HIGH (ref 0.61–1.24)
GFR, Estimated: 40 mL/min — ABNORMAL LOW (ref >60)
Glucose, Bld: 107 mg/dL — ABNORMAL HIGH (ref 70–99)
Potassium: 4.1 mmol/L (ref 3.5–5.1)
Sodium: 140 mmol/L (ref 135–145)
Total Bilirubin: 0.6 mg/dL (ref 0.0–1.2)
Total Protein: 7.7 g/dL (ref 6.5–8.1)

## 2023-10-18 LAB — IRON AND IRON BINDING CAPACITY (CC-WL,HP ONLY)
Iron: 65 ug/dL (ref 45–182)
Saturation Ratios: 16 % — ABNORMAL LOW (ref 17.9–39.5)
TIBC: 417 ug/dL (ref 250–450)
UIBC: 352 ug/dL

## 2023-10-18 LAB — FERRITIN: Ferritin: 55 ng/mL (ref 24–336)

## 2023-10-18 LAB — LACTATE DEHYDROGENASE: LDH: 258 U/L — ABNORMAL HIGH (ref 98–192)

## 2023-10-18 MED ORDER — ROMIPLOSTIM 125 MCG ~~LOC~~ SOLR
95.0000 ug | Freq: Once | SUBCUTANEOUS | Status: AC
  Administered 2023-10-18: 95 ug via SUBCUTANEOUS
  Filled 2023-10-18: qty 0.19

## 2023-10-18 NOTE — Progress Notes (Signed)
 Hematology and Oncology Follow Up Visit  Lonnie Hansen [id number] 19-Dec-1950 73 y.o. 10/18/2023   Principle Diagnosis:  Chronic ITP   Current Therapy:        Nplate  for platelets below 100,000   Interim History:  Lonnie Hansen is here today for follow-up and Nplate .  Today he states that he has been well. He has no concerns.   He has changed his diet recently to include more healthy options and less salt, sugar and high cholesterol items. He is going to the [facility] every day. He is prepping for his right hip replacement in January, 2026.  There is been no problems with bleeding.  He has had no bruising.  He has had no change in bowel or bladder habits.  His appetite is doing all right.  He has had no issues with cough or shortness of breath. Weight loss is intentional.   Overall, I would say that his performance status is probably ECOG 1.   Wt Readings from Last 3 Encounters:  10/18/23 184 lb (83.5 kg)  09/15/23 184 lb (83.5 kg)  08/04/23 182 lb (82.6 kg)     Medications:  Allergies as of 10/18/2023   No Known Allergies      Medication List        Accurate as of October 18, 2023  3:18 PM. If you have any questions, ask your nurse or doctor.          amLODipine  10 MG tablet Commonly known as: NORVASC  Take 10 mg by mouth daily.   Aspirin -Acetaminophen  500-325 MG Pack Take 1 packet by mouth daily as needed.   atenolol  25 MG tablet Commonly known as: TENORMIN  Take 12.5 mg by mouth 2 (two) times daily.   benzonatate 100 MG capsule Commonly known as: TESSALON Take 100 mg by mouth 3 (three) times daily as needed.   fluticasone  50 MCG/ACT nasal spray Commonly known as: FLONASE  Place 1 spray into both nostrils daily.   hydrocortisone  cream 1 % Apply topically 2 (two) times daily.   latanoprost  0.005 % ophthalmic solution Commonly known as: XALATAN  Place 1 drop into both eyes 2 (two) times daily.   losartan -hydrochlorothiazide  100-12.5 MG tablet Commonly  known as: HYZAAR Take 0.5 tablets by mouth daily. 1/2 tablet in the morning, 1/2 tablet at night time.   multivitamin with minerals Tabs tablet Take 1 tablet by mouth daily.   pantoprazole  40 MG tablet Commonly known as: PROTONIX  Take 40 mg by mouth 2 (two) times daily.   rosuvastatin  20 MG tablet Commonly known as: CRESTOR  Take 20 mg by mouth daily.   tamsulosin 0.4 MG Caps capsule Commonly known as: FLOMAX Take 0.4 mg by mouth at bedtime.        Allergies: No Known Allergies  Past Medical History, Surgical history, Social history, and Family History were reviewed and updated.  Review of Systems: Review of Systems  Constitutional: Negative.   HENT: Negative.    Eyes: Negative.   Respiratory: Negative.    Cardiovascular: Negative.   Gastrointestinal: Negative.   Genitourinary: Negative.   Musculoskeletal: Negative.   Skin: Negative.   Neurological: Negative.   Endo/Heme/Allergies: Negative.   Psychiatric/Behavioral: Negative.     Physical Exam:  Vitals:   10/18/23 1502  BP: 127/67  Pulse: (!) 55  Resp: 19  Temp: 98.2 F (36.8 C)  SpO2: 100%    Wt Readings from Last 3 Encounters:  10/18/23 184 lb (83.5 kg)  09/15/23 184 lb (83.5 kg)  08/04/23 182 lb (  82.6 kg)    Physical Exam Vitals reviewed.  HENT:     Head: Normocephalic and atraumatic.  Eyes:     Pupils: Pupils are equal, round, and reactive to light.  Cardiovascular:     Rate and Rhythm: Normal rate and regular rhythm.     Heart sounds: Normal heart sounds.  Pulmonary:     Effort: Pulmonary effort is normal.     Breath sounds: Normal breath sounds.  Abdominal:     General: Bowel sounds are normal.     Palpations: Abdomen is soft.  Musculoskeletal:        General: No tenderness or deformity. Normal range of motion.     Cervical back: Normal range of motion.  Lymphadenopathy:     Cervical: No cervical adenopathy.  Skin:    General: Skin is warm and dry.     Findings: No erythema or  rash.  Neurological:     Mental Status: He is alert and oriented to person, place, and time.  Psychiatric:        Behavior: Behavior normal.        Thought Content: Thought content normal.        Judgment: Judgment normal.       Lab Results  Component Value Date   WBC 9.8 10/18/2023   HGB 11.3 (L) 10/18/2023   HCT 33.5 (L) 10/18/2023   MCV 91.3 10/18/2023   PLT 65 (L) 10/18/2023   Lab Results  Component Value Date   FERRITIN 108 06/03/2023   IRON  100 06/03/2023   TIBC 344 06/03/2023   UIBC 244 06/03/2023   IRONPCTSAT 29 06/03/2023   Lab Results  Component Value Date   RETICCTPCT 4.2 (H) 10/12/2022   RBC 3.67 (L) 10/18/2023   No results found for: KPAFRELGTCHN, LAMBDASER, KAPLAMBRATIO No results found for: IGGSERUM, IGA, IGMSERUM No results found for: STEPHANY CARLOTA BENSON MARKEL EARLA JOANNIE DOC VICK, SPEI   Chemistry      Component Value Date/Time   NA 140 10/18/2023 1437   NA 140 05/11/2019 0946   K 4.1 10/18/2023 1437   CL 102 10/18/2023 1437   CO2 26 10/18/2023 1437   BUN 22 10/18/2023 1437   BUN 17 05/11/2019 0946   CREATININE 1.78 (H) 10/18/2023 1437   CREATININE 0.75 04/10/2016 1132      Component Value Date/Time   CALCIUM  9.8 10/18/2023 1437   ALKPHOS 50 10/18/2023 1437   AST 24 10/18/2023 1437   ALT 9 10/18/2023 1437   BILITOT 0.6 10/18/2023 1437      Encounter Diagnoses  Name Primary?   Chronic ITP (idiopathic thrombocytopenia) (HCC) Yes   Iron  deficiency anemia, unspecified iron  deficiency anemia type    Impression and Plan: Lonnie Hansen is a very pleasant 73 yo African American gentleman with chronic ITP.  His platelet count is on the lower side.  However, it is stable.  He is asymptomatic.  Hgb 11.3 Platelets today are 65- NPLATE  given- goal is to get him to 100,000 prior to his hip replacement in January  Nplate  to be administered today  RTC 1 months APP, labs(CBC w, CMP, LDH), Nplate      Lauraine CHRISTELLA Dais, PA-C 9/22/20253:18 PM

## 2023-10-19 ENCOUNTER — Ambulatory Visit: Payer: Self-pay | Admitting: Medical Oncology

## 2023-11-04 DIAGNOSIS — L218 Other seborrheic dermatitis: Secondary | ICD-10-CM | POA: Diagnosis not present

## 2023-11-04 DIAGNOSIS — L304 Erythema intertrigo: Secondary | ICD-10-CM | POA: Diagnosis not present

## 2023-11-04 DIAGNOSIS — L723 Sebaceous cyst: Secondary | ICD-10-CM | POA: Diagnosis not present

## 2023-11-15 ENCOUNTER — Inpatient Hospital Stay

## 2023-11-15 ENCOUNTER — Inpatient Hospital Stay: Admitting: Family

## 2023-11-15 ENCOUNTER — Other Ambulatory Visit: Payer: Self-pay

## 2023-11-15 ENCOUNTER — Encounter: Payer: Self-pay | Admitting: Family

## 2023-11-15 ENCOUNTER — Inpatient Hospital Stay: Attending: Hematology & Oncology

## 2023-11-15 VITALS — BP 135/70 | HR 60 | Temp 97.0°F | Resp 17

## 2023-11-15 DIAGNOSIS — Z79899 Other long term (current) drug therapy: Secondary | ICD-10-CM | POA: Insufficient documentation

## 2023-11-15 DIAGNOSIS — S9032XA Contusion of left foot, initial encounter: Secondary | ICD-10-CM | POA: Insufficient documentation

## 2023-11-15 DIAGNOSIS — D693 Immune thrombocytopenic purpura: Secondary | ICD-10-CM | POA: Diagnosis not present

## 2023-11-15 DIAGNOSIS — W208XXA Other cause of strike by thrown, projected or falling object, initial encounter: Secondary | ICD-10-CM | POA: Insufficient documentation

## 2023-11-15 DIAGNOSIS — D509 Iron deficiency anemia, unspecified: Secondary | ICD-10-CM

## 2023-11-15 LAB — CMP (CANCER CENTER ONLY)
ALT: 10 U/L (ref 0–44)
AST: 27 U/L (ref 15–41)
Albumin: 4.8 g/dL (ref 3.5–5.0)
Alkaline Phosphatase: 49 U/L (ref 38–126)
Anion gap: 13 (ref 5–15)
BUN: 23 mg/dL (ref 8–23)
CO2: 25 mmol/L (ref 22–32)
Calcium: 9.8 mg/dL (ref 8.9–10.3)
Chloride: 103 mmol/L (ref 98–111)
Creatinine: 1.8 mg/dL — ABNORMAL HIGH (ref 0.61–1.24)
GFR, Estimated: 39 mL/min — ABNORMAL LOW (ref >60)
Glucose, Bld: 106 mg/dL — ABNORMAL HIGH (ref 70–99)
Potassium: 4.2 mmol/L (ref 3.5–5.1)
Sodium: 140 mmol/L (ref 135–145)
Total Bilirubin: 0.7 mg/dL (ref 0.0–1.2)
Total Protein: 7.8 g/dL (ref 6.5–8.1)

## 2023-11-15 LAB — CBC
HCT: 34.5 % — ABNORMAL LOW (ref 39.0–52.0)
Hemoglobin: 11.4 g/dL — ABNORMAL LOW (ref 13.0–17.0)
MCH: 30.7 pg (ref 26.0–34.0)
MCHC: 33 g/dL (ref 30.0–36.0)
MCV: 93 fL (ref 80.0–100.0)
Platelets: 68 K/uL — ABNORMAL LOW (ref 150–400)
RBC: 3.71 MIL/uL — ABNORMAL LOW (ref 4.22–5.81)
RDW: 13.5 % (ref 11.5–15.5)
WBC: 6.9 K/uL (ref 4.0–10.5)
nRBC: 0 % (ref 0.0–0.2)

## 2023-11-15 LAB — IRON AND IRON BINDING CAPACITY (CC-WL,HP ONLY)
Iron: 87 ug/dL (ref 45–182)
Saturation Ratios: 20 % (ref 17.9–39.5)
TIBC: 441 ug/dL (ref 250–450)
UIBC: 354 ug/dL

## 2023-11-15 LAB — LACTATE DEHYDROGENASE: LDH: 282 U/L — ABNORMAL HIGH (ref 98–192)

## 2023-11-15 LAB — FERRITIN: Ferritin: 58 ng/mL (ref 24–336)

## 2023-11-15 MED ORDER — ROMIPLOSTIM 125 MCG ~~LOC~~ SOLR
95.0000 ug | Freq: Once | SUBCUTANEOUS | Status: AC
  Administered 2023-11-15: 95 ug via SUBCUTANEOUS
  Filled 2023-11-15: qty 0.19

## 2023-11-15 NOTE — Patient Instructions (Signed)
 Romiplostim Injection What is this medication? ROMIPLOSTIM (roe mi PLOE stim) treats low levels of platelets in your body caused by immune thrombocytopenia (ITP). It is prescribed when other medications have not worked or cannot be tolerated. It may also be used to help people who have been exposed to high doses of radiation. It works by increasing the amount of platelets in your blood. This lowers the risk of bleeding. This medicine may be used for other purposes; ask your health care provider or pharmacist if you have questions. COMMON BRAND NAME(S): Nplate What should I tell my care team before I take this medication? They need to know if you have any of these conditions: Blood clots Myelodysplastic syndrome An unusual or allergic reaction to romiplostim, mannitol, other medications, foods, dyes, or preservatives Pregnant or trying to get pregnant Breast-feeding How should I use this medication? This medication is injected under the skin. It is given by a care team in a hospital or clinic setting. A special MedGuide will be given to you before each treatment. Be sure to read this information carefully each time. Talk to your care team about the use of this medication in children. While it may be prescribed for children as young as newborns for selected conditions, precautions do apply. Overdosage: If you think you have taken too much of this medicine contact a poison control center or emergency room at once. NOTE: This medicine is only for you. Do not share this medicine with others. What if I miss a dose? Keep appointments for follow-up doses. It is important not to miss your dose. Call your care team if you are unable to keep an appointment. What may interact with this medication? Interactions are not expected. This list may not describe all possible interactions. Give your health care provider a list of all the medicines, herbs, non-prescription drugs, or dietary supplements you use. Also  tell them if you smoke, drink alcohol, or use illegal drugs. Some items may interact with your medicine. What should I watch for while using this medication? Visit your care team for regular checks on your progress. You may need blood work done while you are taking this medication. Your condition will be monitored carefully while you are receiving this medication. It is important not to miss any appointments. What side effects may I notice from receiving this medication? Side effects that you should report to your care team as soon as possible: Allergic reactions--skin rash, itching, hives, swelling of the face, lips, tongue, or throat Blood clot--pain, swelling, or warmth in the leg, shortness of breath, chest pain Side effects that usually do not require medical attention (report to your care team if they continue or are bothersome): Dizziness Joint pain Muscle pain Pain in the hands or feet Stomach pain Trouble sleeping This list may not describe all possible side effects. Call your doctor for medical advice about side effects. You may report side effects to FDA at 1-800-FDA-1088. Where should I keep my medication? This medication is given in a hospital or clinic. It will not be stored at home. NOTE: This sheet is a summary. It may not cover all possible information. If you have questions about this medicine, talk to your doctor, pharmacist, or health care provider.  2024 Elsevier/Gold Standard (2021-05-19 00:00:00)

## 2023-11-15 NOTE — Progress Notes (Signed)
 Hematology and Oncology Follow Up Visit  Lonnie Hansen [id number] 1950/03/26 73 y.o. 11/15/2023   Principle Diagnosis:  Chronic ITP   Current Therapy:        Nplate  for platelets below 100,000   Interim History:  Lonnie Hansen is here today for follow-up and Nplate . Platelets today are 68. He has not noted any blood loss, abnormal bruising or petechiae.  He has a well healing bruise on the left foot where he dropped a 5 lb bag or sugar on it.  No swelling, numbness or tingling in his extremities.  He states that he needs a right total hip replacement and will likely be having in January. He has not scheduled yet and will let us  know once he does so we can coordinate with his surgeon.  He has what appears to be a mobile fluid cyst on the bottom of his left foot. He plans to discuss with his PCP.  No falls or syncope.  Appetite and hydration are good.  No fever, chills, n/v, cough, rash, dizziness, SOB, chest pain, palpitations, abdominal pain or changes in bowel or bladder habits.   ECOG Performance Status: 1 - Symptomatic but completely ambulatory  Medications:  Allergies as of 11/15/2023   No Known Allergies      Medication List        Accurate as of November 15, 2023  2:41 PM. If you have any questions, ask your nurse or doctor.          amLODipine  10 MG tablet Commonly known as: NORVASC  Take 10 mg by mouth daily.   Aspirin -Acetaminophen  500-325 MG Pack Take 1 packet by mouth daily as needed.   atenolol  25 MG tablet Commonly known as: TENORMIN  Take 12.5 mg by mouth 2 (two) times daily.   benzonatate 100 MG capsule Commonly known as: TESSALON Take 100 mg by mouth 3 (three) times daily as needed.   fluticasone  50 MCG/ACT nasal spray Commonly known as: FLONASE  Place 1 spray into both nostrils daily.   hydrocortisone  cream 1 % Apply topically 2 (two) times daily.   latanoprost  0.005 % ophthalmic solution Commonly known as: XALATAN  Place 1 drop into both  eyes 2 (two) times daily.   losartan -hydrochlorothiazide  100-12.5 MG tablet Commonly known as: HYZAAR Take 0.5 tablets by mouth daily. 1/2 tablet in the morning, 1/2 tablet at night time.   multivitamin with minerals Tabs tablet Take 1 tablet by mouth daily.   pantoprazole  40 MG tablet Commonly known as: PROTONIX  Take 40 mg by mouth 2 (two) times daily.   rosuvastatin  20 MG tablet Commonly known as: CRESTOR  Take 20 mg by mouth daily.   tamsulosin 0.4 MG Caps capsule Commonly known as: FLOMAX Take 0.4 mg by mouth at bedtime.        Allergies: No Known Allergies  Past Medical History, Surgical history, Social history, and Family History were reviewed and updated.  Review of Systems: All other 10 point review of systems is negative.   Physical Exam:  oral temperature is 97 F (36.1 C) (abnormal). His blood pressure is 135/70 and his pulse is 60. His respiration is 17 and oxygen  saturation is 100%.   Wt Readings from Last 3 Encounters:  10/18/23 184 lb (83.5 kg)  09/15/23 184 lb (83.5 kg)  08/04/23 182 lb (82.6 kg)    Ocular: Sclerae unicteric, pupils equal, round and reactive to light Ear-nose-throat: Oropharynx clear, dentition fair Lymphatic: No cervical or supraclavicular adenopathy Lungs no rales or rhonchi, good excursion bilaterally Heart  regular rate and rhythm, no murmur appreciated Abd soft, nontender, positive bowel sounds MSK no focal spinal tenderness, no joint edema Neuro: non-focal, well-oriented, appropriate affect Breasts: Deferred   Lab Results  Component Value Date   WBC 6.9 11/15/2023   HGB 11.4 (L) 11/15/2023   HCT 34.5 (L) 11/15/2023   MCV 93.0 11/15/2023   PLT 68 (L) 11/15/2023   Lab Results  Component Value Date   FERRITIN 55 10/18/2023   IRON  65 10/18/2023   TIBC 417 10/18/2023   UIBC 352 10/18/2023   IRONPCTSAT 16 (L) 10/18/2023   Lab Results  Component Value Date   RETICCTPCT 4.2 (H) 10/12/2022   RBC 3.71 (L) 11/15/2023    No results found for: KPAFRELGTCHN, LAMBDASER, KAPLAMBRATIO No results found for: IGGSERUM, IGA, IGMSERUM No results found for: STEPHANY CARLOTA BENSON MARKEL EARLA JOANNIE DOC VICK, SPEI   Chemistry      Component Value Date/Time   NA 140 10/18/2023 1437   NA 140 05/11/2019 0946   K 4.1 10/18/2023 1437   CL 102 10/18/2023 1437   CO2 26 10/18/2023 1437   BUN 22 10/18/2023 1437   BUN 17 05/11/2019 0946   CREATININE 1.78 (H) 10/18/2023 1437   CREATININE 0.75 04/10/2016 1132      Component Value Date/Time   CALCIUM  9.8 10/18/2023 1437   ALKPHOS 50 10/18/2023 1437   AST 24 10/18/2023 1437   ALT 9 10/18/2023 1437   BILITOT 0.6 10/18/2023 1437       Impression and Plan: Lonnie Hansen is a very pleasant 73 yo African American gentleman with chronic ITP.   Nplate  given for platelet count of 68. Hgb stable at 11.4, WBC count 6.9.  Iron  studies pending. We will replace if needed.  Follow-up in 1 month.   Lauraine Pepper, NP 10/20/20252:41 PM

## 2023-11-17 ENCOUNTER — Ambulatory Visit: Admitting: Orthopaedic Surgery

## 2023-11-17 ENCOUNTER — Other Ambulatory Visit: Payer: Self-pay

## 2023-11-17 ENCOUNTER — Encounter: Payer: Self-pay | Admitting: Orthopaedic Surgery

## 2023-11-17 DIAGNOSIS — M1611 Unilateral primary osteoarthritis, right hip: Secondary | ICD-10-CM

## 2023-11-17 DIAGNOSIS — M25551 Pain in right hip: Secondary | ICD-10-CM

## 2023-11-17 NOTE — Progress Notes (Addendum)
 The patient is very well-known to me.  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  He does have ITP and does regularly see hematology oncology.  He does have low platelets.  He is interested in having hip replacement surgery sometime later in January 2026.  He has ambulate with a cane.  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  Also, he does see hematology oncology on a regular basis and once we have an established date for surgery they can work on getting his platelets build back up.  Yesterday his platelets were down to 68.  Previous x-rays of his right hip are reviewed again today and show severe end-stage arthritis.  On exam he has limited range of motion of the right hip.  He would like to get set up soon for an intra-articular steroid injection in his right hip joint under fluoroscopy which has been done in the past and that has helped him quite a bit.  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  We will set this up through Dr. Eldonna hopefully in the next week or so.  We will then fill out the surgery schedule state and see about getting him on the surgery schedule at [facility] for sometime later in January with enough heads up in order to make sure that his platelets are build back up and the platelets are available for surgery if needed.

## 2023-11-24 ENCOUNTER — Ambulatory Visit: Admitting: Orthopaedic Surgery

## 2023-11-24 ENCOUNTER — Telehealth: Payer: Self-pay

## 2023-11-24 NOTE — Telephone Encounter (Signed)
 Hey this fella can't come in the afternoon of the day he was scheduled, can he be moved up to a morning? Can you give him a call please

## 2023-11-29 ENCOUNTER — Encounter: Payer: Self-pay | Admitting: Radiology

## 2023-11-29 DIAGNOSIS — I10 Essential (primary) hypertension: Secondary | ICD-10-CM | POA: Diagnosis not present

## 2023-11-29 DIAGNOSIS — L731 Pseudofolliculitis barbae: Secondary | ICD-10-CM | POA: Diagnosis not present

## 2023-11-29 DIAGNOSIS — L02419 Cutaneous abscess of limb, unspecified: Secondary | ICD-10-CM | POA: Diagnosis not present

## 2023-12-06 ENCOUNTER — Other Ambulatory Visit: Payer: Self-pay | Admitting: Surgery

## 2023-12-06 DIAGNOSIS — I7121 Aneurysm of the ascending aorta, without rupture: Secondary | ICD-10-CM

## 2023-12-08 ENCOUNTER — Encounter: Admitting: Physical Medicine and Rehabilitation

## 2023-12-13 ENCOUNTER — Telehealth (HOSPITAL_BASED_OUTPATIENT_CLINIC_OR_DEPARTMENT_OTHER): Payer: Self-pay

## 2023-12-13 NOTE — Telephone Encounter (Signed)
   Pre-operative Risk Assessment    Patient Name: Lonnie Hansen  DOB: 1950-02-02  MRN: [id number]   Date of last office visit: 01/01/2022 with Dr. Jordan Date of next office visit: None  Request for Surgical Clearance    Procedure:  Right total hip arthroplasty  Date of Surgery:  Clearance 02/04/24                                 Surgeon:  Dr. Lonni Poli  Surgeon's Group or Practice Name:  [facility] Phone number:  [phone] Fax number:  [phone]   Type of Clearance Requested:   - Medical    Type of Anesthesia:  Spinal   Additional requests/questions:  None  SignedPatrcia Iverson CROME   12/13/2023, 1:06 PM

## 2023-12-13 NOTE — Telephone Encounter (Signed)
 S/w the pt and he has been scheduled appt with Olivia Pavy, [facility] 01/04/24 @ 12:30.

## 2023-12-13 NOTE — Telephone Encounter (Signed)
 I was speaking with the pt about scheduling an appt in office the office for prep clearance. Pt said to hold on he had to get his calendar. I was on hold for 2.5 minutes, the lost the call. I will try to call the pt back.

## 2023-12-13 NOTE — Telephone Encounter (Signed)
    Primary Cardiologist:Peter Jordan, MD  Chart reviewed as part of pre-operative protocol coverage. Because of Lonnie Hansen's past medical history and time since last visit, he/she will require a follow-up office  visit in order to better assess preoperative cardiovascular risk.  Pre-op covering staff: - Please schedule appointment and call patient to inform them. - Please contact requesting surgeon's office via preferred method (i.e, phone, fax) to inform them of need for appointment prior to surgery.  If applicable, this message will also be routed to pharmacy pool and/or primary cardiologist for input on holding anticoagulant/antiplatelet agent as requested below so that this information is available at time of patient's appointment.   Josefa CHRISTELLA Beauvais, NP  12/13/2023, 2:43 PM

## 2023-12-16 ENCOUNTER — Ambulatory Visit (INDEPENDENT_AMBULATORY_CARE_PROVIDER_SITE_OTHER): Admitting: Physical Medicine and Rehabilitation

## 2023-12-16 ENCOUNTER — Other Ambulatory Visit: Payer: Self-pay

## 2023-12-16 ENCOUNTER — Telehealth: Payer: Self-pay | Admitting: Physical Medicine and Rehabilitation

## 2023-12-16 DIAGNOSIS — M25551 Pain in right hip: Secondary | ICD-10-CM | POA: Diagnosis not present

## 2023-12-16 DIAGNOSIS — R310 Gross hematuria: Secondary | ICD-10-CM | POA: Diagnosis not present

## 2023-12-16 NOTE — Progress Notes (Signed)
 Pain Scale   Average Pain 5 Patient advising he has chronic right hip pain that is constant.        +Driver, -BT, -Dye Allergies.

## 2023-12-16 NOTE — Telephone Encounter (Signed)
 Patient returned to office this afternoon post right intra-articular hip injection with Dr. Eldonna. History of ITP and lower platelet count. He reports bleeding noted at injection site that saturated band aid and underwear. Injection site noted to be oozing upon arrival to office, pressure dressing applied, bleeding controlled. He was sent home with strict instructions to monitor and call the office with any continued bleeding. Patient is stable upon discharge this afternoon.

## 2023-12-16 NOTE — Progress Notes (Signed)
   Lonnie Hansen - 73 y.o. male MRN [id number]  Date of birth: 04/11/1950  Office Visit Note: Visit Date: 12/16/2023 PCP: Yolande Toribio MATSU, MD Referred by: Yolande Toribio MATSU, MD  Subjective: Chief Complaint  Patient presents with   Right Hip - Pain   HPI:  Lonnie Hansen is a 73 y.o. male who comes in today at the request of Dr. Lonni Poli for planned Right anesthetic hip arthrogram with fluoroscopic guidance.  The patient has failed conservative care including home exercise, medications, time and activity modification.  This injection will be diagnostic and hopefully therapeutic.  Please see requesting physician notes for further details and justification.   ROS Otherwise per HPI.  Assessment & Plan: Visit Diagnoses:    ICD-10-CM   1. Pain in right hip  M25.551 Large Joint Inj    XR C-ARM NO REPORT      Plan: No additional findings.   Meds & Orders: No orders of the defined types were placed in this encounter.   Orders Placed This Encounter  Procedures   Large Joint Inj   XR C-ARM NO REPORT    Follow-up: Return for visit to requesting provider as needed.   Procedures: Large Joint Inj on 12/16/2023 1:01 PM Indications: diagnostic evaluation and pain Details: 22 G 3.5 in needle, fluoroscopy-guided anterior approach  Arthrogram: No  Medications: 4 mL bupivacaine  0.25 %; 40 mg triamcinolone  acetonide 40 MG/ML Outcome: tolerated well, no immediate complications  There was excellent flow of contrast producing a partial arthrogram of the hip. The patient did have relief of symptoms during the anesthetic phase of the injection. Procedure, treatment alternatives, risks and benefits explained, specific risks discussed. Consent was given by the patient. Immediately prior to procedure a time out was called to verify the correct patient, procedure, equipment, support staff and site/side marked as required. Patient was prepped and draped in the usual sterile fashion.           Clinical History: No specialty comments available.     Objective:  VS:  HT:    WT:   BMI:     BP:   HR: bpm  TEMP: ( )  RESP:  Physical Exam   Imaging: No results found.

## 2023-12-17 ENCOUNTER — Encounter: Payer: Self-pay | Admitting: Family

## 2023-12-17 ENCOUNTER — Inpatient Hospital Stay (HOSPITAL_BASED_OUTPATIENT_CLINIC_OR_DEPARTMENT_OTHER): Admitting: Family

## 2023-12-17 ENCOUNTER — Other Ambulatory Visit: Payer: Self-pay

## 2023-12-17 ENCOUNTER — Inpatient Hospital Stay: Attending: Hematology & Oncology

## 2023-12-17 ENCOUNTER — Inpatient Hospital Stay

## 2023-12-17 VITALS — BP 127/65 | HR 75 | Temp 97.1°F | Resp 18 | Ht 69.0 in | Wt 184.0 lb

## 2023-12-17 DIAGNOSIS — D509 Iron deficiency anemia, unspecified: Secondary | ICD-10-CM | POA: Diagnosis not present

## 2023-12-17 DIAGNOSIS — M25551 Pain in right hip: Secondary | ICD-10-CM | POA: Diagnosis not present

## 2023-12-17 DIAGNOSIS — Z79899 Other long term (current) drug therapy: Secondary | ICD-10-CM | POA: Insufficient documentation

## 2023-12-17 DIAGNOSIS — D693 Immune thrombocytopenic purpura: Secondary | ICD-10-CM | POA: Diagnosis not present

## 2023-12-17 DIAGNOSIS — R310 Gross hematuria: Secondary | ICD-10-CM | POA: Diagnosis not present

## 2023-12-17 LAB — CMP (CANCER CENTER ONLY)
ALT: 9 U/L (ref 0–44)
AST: 27 U/L (ref 15–41)
Albumin: 5 g/dL (ref 3.5–5.0)
Alkaline Phosphatase: 56 U/L (ref 38–126)
Anion gap: 15 (ref 5–15)
BUN: 30 mg/dL — ABNORMAL HIGH (ref 8–23)
CO2: 21 mmol/L — ABNORMAL LOW (ref 22–32)
Calcium: 10 mg/dL (ref 8.9–10.3)
Chloride: 101 mmol/L (ref 98–111)
Creatinine: 1.56 mg/dL — ABNORMAL HIGH (ref 0.61–1.24)
GFR, Estimated: 46 mL/min — ABNORMAL LOW (ref >60)
Glucose, Bld: 120 mg/dL — ABNORMAL HIGH (ref 70–99)
Potassium: 4.4 mmol/L (ref 3.5–5.1)
Sodium: 137 mmol/L (ref 135–145)
Total Bilirubin: 0.6 mg/dL (ref 0.0–1.2)
Total Protein: 7.7 g/dL (ref 6.5–8.1)

## 2023-12-17 LAB — CBC WITH DIFFERENTIAL (CANCER CENTER ONLY)
Abs Immature Granulocytes: 0.06 K/uL (ref 0.00–0.07)
Basophils Absolute: 0 K/uL (ref 0.0–0.1)
Basophils Relative: 0 %
Eosinophils Absolute: 0 K/uL (ref 0.0–0.5)
Eosinophils Relative: 0 %
HCT: 33.5 % — ABNORMAL LOW (ref 39.0–52.0)
Hemoglobin: 11.4 g/dL — ABNORMAL LOW (ref 13.0–17.0)
Immature Granulocytes: 1 %
Lymphocytes Relative: 6 %
Lymphs Abs: 0.6 K/uL — ABNORMAL LOW (ref 0.7–4.0)
MCH: 31.5 pg (ref 26.0–34.0)
MCHC: 34 g/dL (ref 30.0–36.0)
MCV: 92.5 fL (ref 80.0–100.0)
Monocytes Absolute: 0.3 K/uL (ref 0.1–1.0)
Monocytes Relative: 3 %
Neutro Abs: 9 K/uL — ABNORMAL HIGH (ref 1.7–7.7)
Neutrophils Relative %: 90 %
Platelet Count: 69 K/uL — ABNORMAL LOW (ref 150–400)
RBC: 3.62 MIL/uL — ABNORMAL LOW (ref 4.22–5.81)
RDW: 13.7 % (ref 11.5–15.5)
WBC Count: 9.9 K/uL (ref 4.0–10.5)
nRBC: 0 % (ref 0.0–0.2)

## 2023-12-17 LAB — IRON AND IRON BINDING CAPACITY (CC-WL,HP ONLY)
Iron: 74 ug/dL (ref 45–182)
Saturation Ratios: 17 % — ABNORMAL LOW (ref 17.9–39.5)
TIBC: 430 ug/dL (ref 250–450)
UIBC: 356 ug/dL

## 2023-12-17 LAB — LACTATE DEHYDROGENASE: LDH: 310 U/L — ABNORMAL HIGH (ref 105–235)

## 2023-12-17 LAB — FERRITIN: Ferritin: 65 ng/mL (ref 24–336)

## 2023-12-17 MED ORDER — ROMIPLOSTIM 125 MCG ~~LOC~~ SOLR
95.0000 ug | Freq: Once | SUBCUTANEOUS | Status: AC
  Administered 2023-12-17: 95 ug via SUBCUTANEOUS
  Filled 2023-12-17: qty 0.19

## 2023-12-17 MED ORDER — SODIUM CHLORIDE 0.9 % IV SOLN: INTRAVENOUS | Status: DC

## 2023-12-17 NOTE — Progress Notes (Signed)
 Hematology and Oncology Follow Up Visit  Lonnie Hansen [id number] 06/23/1950 73 y.o. 12/17/2023   Principle Diagnosis:  Chronic ITP   Current Therapy:        Nplate  for platelets below 100,000   Interim History:  Lonnie Hansen is here today for follow-up. He is doing well and has no new complaints at this time.  He recently got a steroid injection in the right hip and has noticed sa significant improvement in his pain and mobility He bled at the site of the injection yesterday but his nurse put a compression dress over it and this stopper the bleeding.  No bruising, other bleeding or petechiae.  No fever, chills, n/v, cough, rash, dizziness, SOB, chest pin, palpitations, abdominal pain or changes in bowel or bladder habits.  No swelling, tenderness, numbness or tingling in his extremities.  No falls or syncope reported. He ambulates with his cane for support.  Appetite and hydration are good. Weight is stable at 184 lbs.   ECOG Performance Status: 1 - Symptomatic but completely ambulatory  Medications:  Allergies as of 12/17/2023   No Known Allergies      Medication List        Accurate as of December 17, 2023  6:15 PM. If you have any questions, ask your nurse or doctor.          amLODipine  10 MG tablet Commonly known as: NORVASC  Take 10 mg by mouth daily.   Aspirin -Acetaminophen  500-325 MG Pack Take 1 packet by mouth daily as needed.   atenolol  25 MG tablet Commonly known as: TENORMIN  Take 12.5 mg by mouth 2 (two) times daily.   benzonatate 100 MG capsule Commonly known as: TESSALON Take 100 mg by mouth 3 (three) times daily as needed.   fluticasone  50 MCG/ACT nasal spray Commonly known as: FLONASE  Place 1 spray into both nostrils daily.   hydrocortisone  cream 1 % Apply topically 2 (two) times daily.   latanoprost  0.005 % ophthalmic solution Commonly known as: XALATAN  Place 1 drop into both eyes 2 (two) times daily.   losartan -hydrochlorothiazide   100-12.5 MG tablet Commonly known as: HYZAAR Take 0.5 tablets by mouth daily. 1/2 tablet in the morning, 1/2 tablet at night time.   multivitamin with minerals Tabs tablet Take 1 tablet by mouth daily.   pantoprazole  40 MG tablet Commonly known as: PROTONIX  Take 40 mg by mouth 2 (two) times daily.   rosuvastatin  20 MG tablet Commonly known as: CRESTOR  Take 20 mg by mouth daily.   tamsulosin 0.4 MG Caps capsule Commonly known as: FLOMAX Take 0.4 mg by mouth at bedtime.        Allergies: No Known Allergies  Past Medical History, Surgical history, Social history, and Family History were reviewed and updated.  Review of Systems: All other 10 point review of systems is negative.   Physical Exam:  height is 5' 9 (1.753 m) and weight is 184 lb (83.5 kg). His oral temperature is 97.1 F (36.2 C) (abnormal). His blood pressure is 127/65 and his pulse is 75. His respiration is 18 and oxygen  saturation is 100%.   Wt Readings from Last 3 Encounters:  12/17/23 184 lb (83.5 kg)  10/18/23 184 lb (83.5 kg)  09/15/23 184 lb (83.5 kg)    Ocular: Sclerae unicteric, pupils equal, round and reactive to light Ear-nose-throat: Oropharynx clear, dentition fair Lymphatic: No cervical or supraclavicular adenopathy Lungs no rales or rhonchi, good excursion bilaterally Heart regular rate and rhythm, no murmur appreciated Abd soft,  nontender, positive bowel sounds MSK no focal spinal tenderness, no joint edema Neuro: non-focal, well-oriented, appropriate affect Breasts: Deferred  Lab Results  Component Value Date   WBC 9.9 12/17/2023   HGB 11.4 (L) 12/17/2023   HCT 33.5 (L) 12/17/2023   MCV 92.5 12/17/2023   PLT 69 (L) 12/17/2023   Lab Results  Component Value Date   FERRITIN 65 12/17/2023   IRON  87 11/15/2023   TIBC 441 11/15/2023   UIBC 354 11/15/2023   IRONPCTSAT 20 11/15/2023   Lab Results  Component Value Date   RETICCTPCT 4.2 (H) 10/12/2022   RBC 3.62 (L) 12/17/2023    No results found for: KPAFRELGTCHN, LAMBDASER, KAPLAMBRATIO No results found for: IGGSERUM, IGA, IGMSERUM No results found for: STEPHANY CARLOTA BENSON MARKEL EARLA JOANNIE DOC VICK, SPEI   Chemistry      Component Value Date/Time   NA 137 12/17/2023 1421   NA 140 05/11/2019 0946   K 4.4 12/17/2023 1421   CL 101 12/17/2023 1421   CO2 21 (L) 12/17/2023 1421   BUN 30 (H) 12/17/2023 1421   BUN 17 05/11/2019 0946   CREATININE 1.56 (H) 12/17/2023 1421   CREATININE 0.75 04/10/2016 1132      Component Value Date/Time   CALCIUM  10.0 12/17/2023 1421   ALKPHOS 56 12/17/2023 1421   AST 27 12/17/2023 1421   ALT 9 12/17/2023 1421   BILITOT 0.6 12/17/2023 1421       Impression and Plan: Lonnie Hansen is a very pleasant 73 yo African American gentleman with chronic ITP.   Nplate  given for platelet count of 68. Hgb stable at 11.4, WBC count 6.9.  Iron  studies pending. We will replace if needed.  Follow-up in 1 month.     Lauraine Pepper, NP 11/21/20256:15 PM

## 2023-12-20 ENCOUNTER — Other Ambulatory Visit: Payer: Self-pay | Admitting: Family

## 2023-12-20 DIAGNOSIS — R310 Gross hematuria: Secondary | ICD-10-CM | POA: Diagnosis not present

## 2023-12-20 DIAGNOSIS — R82998 Other abnormal findings in urine: Secondary | ICD-10-CM | POA: Diagnosis not present

## 2023-12-20 DIAGNOSIS — N401 Enlarged prostate with lower urinary tract symptoms: Secondary | ICD-10-CM | POA: Diagnosis not present

## 2023-12-20 DIAGNOSIS — R3912 Poor urinary stream: Secondary | ICD-10-CM | POA: Diagnosis not present

## 2023-12-20 MED ORDER — BUPIVACAINE HCL 0.25 % IJ SOLN
4.0000 mL | INTRAMUSCULAR | Status: AC | PRN
  Administered 2023-12-16: 4 mL via INTRA_ARTICULAR

## 2023-12-20 MED ORDER — TRIAMCINOLONE ACETONIDE 40 MG/ML IJ SUSP
40.0000 mg | INTRAMUSCULAR | Status: AC | PRN
  Administered 2023-12-16: 40 mg via INTRA_ARTICULAR

## 2023-12-22 ENCOUNTER — Telehealth: Payer: Self-pay | Admitting: Family

## 2023-12-22 NOTE — Telephone Encounter (Signed)
 Called to scheduled infusion per inbasket. LVM to return call for scheduling.

## 2023-12-27 ENCOUNTER — Telehealth: Payer: Self-pay | Admitting: *Deleted

## 2023-12-27 NOTE — Telephone Encounter (Signed)
 Patient called stating that he is having hip surgery on Jan 9 and is inquiring how to manage his low platelets,  Dr Timmy notified. Will do labs and injections weekly until date of surgery.  Appts reviewed with patient.  He understands the plan.

## 2023-12-27 NOTE — Progress Notes (Signed)
 Cardiology Office Note:  .   Date:  01/04/2024  ID:  Lonnie Hansen, DOB 1950-04-19, MRN [id number] PCP: Yolande Toribio MATSU, MD  [facility] Providers Cardiologist:  Peter Jordan, MD    History of Present Illness: .   Lonnie Hansen is a 73 y.o. 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 In 2017 chest x-ray showed aortic root enlargement. He underwent CT that showed dilatation of proximal aorta. Descending aorta was normal. Echocardiogram obtained on 09/20/2015 showed EF 60-65%, moderate to severe AI, grade 1 DD. He was evaluated by Dr. Army. CT scan showed aortic root had enlarged to 5.2 cm. Given the size of aortic root and the degree of AI, it was recommended he undergo aortic root grafting and AVR. He underwent preoperative evaluation with cardiac catheterization on 04/14/2016 which showed moderate to severe AI, EF 45%, 80% stenosis in a nondominant RCA, 50% distal LAD. He eventually underwent the planned procedure with ascending aortic root replacement using 32 mm Gelweave Valsalva Graft and AVR using 29 mm Edwards Perimount Magna Ease aortic bioprosthesis valve on 09/15/2016 by Dr. Army.    When seen in follow up chest x-ray showed recurrent right pleural effusion.Echo on 10/09/2016 showed only a trivial amount of pericardial effusion, EF 60-65%. He underwent right thoracentesis again on 10/23/2016 with the removal 2 L of dark blood. Post procedure, he developed a right pneumothorax related to incomplete reexpansion of the right middle and lower lobe of the lung. He was admitted on 10/30/2016 for pigtail drain placement,  chest x-ray obtained on 11/04/2016 showed improvement of right pleural effusion and resolution of right pneumothorax.  Myoview  2023 study was ordered. This demonstrated ischemia in the inferior and apical region with EF 42%. Most recent platelet count was 99K.  Since he was asymptomatic and at  high bleeding risk further invasive evaluation was deferred. Echo done 09/16/20 with EF 65-70%, mild LVH, no RWMA, RV normal, mild MR, S/P AVR with 29 mm MagnaEase bioprosthesis (09/15/16) Peak and mean gradients throuugh the valve are 22 and 11 mm Hg respectively. No significant change from report of April 2021. . The aortic valve has been repaired/replaced. Aortic valve regurgitation is not visualized.    Patient here for f/u speaking fluent french, plays keyboard and trumpet. He works out at [employer] daily. Does 30 min on cardio and does hand weights. No chest pain, dyspnea, palpitations, edema. He's lost 55 lbs.       ROS:    Studies Reviewed: Lonnie Hansen    EKG Interpretation Date/Time:  Tuesday January 04 2024 12:47:34 EST Ventricular Rate:  55 PR Interval:    QRS Duration:  96 QT Interval:  432 QTC Calculation: 413 R Axis:   23  Text Interpretation: Normal sinus rhythm When compared with ECG of 05-Oct-2022 08:04, no change  Confirmed by Parthenia Klinefelter [phone]) on 01/04/2024 1:07:48 PM    Prior CV Studies:    Echo 09/20/2015 LV EF: 60% -   65%   Study Conclusions   - Left ventricle: The cavity size was normal. There was moderate   focal basal hypertrophy of the septum. Systolic function was   normal. The estimated ejection fraction was in the range of 60%   to 65%. Wall motion was normal; there were no regional wall   motion abnormalities. Doppler parameters are consistent with   abnormal left ventricular relaxation (grade 1 diastolic  dysfunction). - Aortic valve: There was moderate to severe regurgitation. - Aorta: Aortic root dimension: 43 mm (ED). - Ascending aorta: The ascending aorta was mildly dilated. - Right ventricle: The cavity size was mildly dilated. Wall   thickness was normal.       Cath 04/16/2016 Conclusion      Mid RCA lesion, 80 %stenosed. Ost LAD to Prox LAD lesion, 25 %stenosed. Dist LAD lesion, 50 %stenosed. There is mild left ventricular systolic  dysfunction. LV end diastolic pressure is normal. The left ventricular ejection fraction is 45-50% by visual estimate. There is no mitral valve regurgitation. There is no aortic valve stenosis. LV end diastolic pressure is normal.   1. 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 Mild LV dysfunction. EF 45%. 3. Ascending thoracic aortic aneurysm. 4. 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 5. Normal Right heart  And LV filling pressures 6. Normal cardiac output.    Plan: Aortic root grafting and AVR.         Echo 09/16/2016 LV EF: 55% -   60%   Study Conclusions   - Left ventricle: The cavity size was normal. Wall thickness was   increased in a pattern of severe LVH. Systolic function was   normal. The estimated ejection fraction was in the range of 55%   to 60%. - Aortic valve: Valve area (VTI): 4.29 cm^2. Valve area (Vmax):   4.15 cm^2. Valve area (Vmean): 3.88 cm^2. - Mitral valve: Severely calcified annulus. Valve area by   continuity equation (using LVOT flow): 3.35 cm^2. - Pericardium, extracardiac: Localized moderate pericardial   effusion posterior to the LA.       Echo 05/19/19: IMPRESSIONS     1. Left ventricular ejection fraction, by estimation, is 55 to 60%. The  left ventricle has normal function. The left ventricle has no regional  wall motion abnormalities. There is mild left ventricular hypertrophy of  the basal-septal segment. Left  ventricular diastolic parameters were normal.   2. Right ventricular systolic function is normal. The right ventricular  size is normal. There is normal pulmonary artery systolic pressure. The  estimated right ventricular systolic pressure is 24.0 mmHg.   3. Left atrial size was mildly dilated.   4. The mitral valve is normal in structure. Mild to moderate mitral valve  regurgitation. No evidence of mitral stenosis.   5. The aortic valve has been repaired/replaced. Aortic valve  regurgitation is not visualized. No  aortic stenosis is present. 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 Procedure  Date: 2018. Echo findings are consistent  with normal structure and function of the aortic valve prosthesis. Aortic  valve mean gradient measures 10.8 mmHg. Aortic valve Vmax measures 2.12  m/s.   6. Aortic root/ascending aorta has been repaired/replaced.   7. The inferior vena cava is normal in size with greater than 50%  respiratory variability, suggesting right atrial pressure of 3 mmHg.   Comparison(s): Prior images unable to be directly viewed, comparison made  by report only.      CT ANGIOGRAPHY CHEST WITH CONTRAST   TECHNIQUE: Multidetector CT imaging of the chest was performed using the standard protocol during bolus administration of intravenous contrast. Multiplanar CT image reconstructions and MIPs were obtained to evaluate the vascular anatomy.   CONTRAST:  75mL ISOVUE -370 IOPAMIDOL  (ISOVUE -370) INJECTION 76%   Creatinine was obtained on site at [facility] at [address]   Results: Creatinine 1.1 mg/dL.  COMPARISON:  11/17/2018   FINDINGS: Cardiovascular: Previous median sternotomy and aortic valve repair. The heart size appears normal. Mild aortic atherosclerosis.   The aortic root measures 4.2 cm in diameter, image 86/10. Unchanged from previous exam.   The sino-tubular junction measures 3 cm in diameter, image 79/10. Unchanged.   The ascending thoracic aorta measures 3.9 cm, image 80/10. Unchanged from previous exam.   The transverse aortic arch measures 4.2 cm, image 131/12. Unchanged from previous exam   The descending thoracic aorta measures 2.9 cm, image 131/10. Stable.   No signs of aortic dissection. The great vessels appear widely patent.   The main pulmonary artery is patent. No obstructing pulmonary embolus identified.   Mediastinum/Nodes: Normal appearance of the thyroid  gland. The trachea appears patent  and is midline. Normal appearance of the esophagus. No supraclavicular, axillary, mediastinal, or hilar lymph nodes.   Lungs/Pleura: No pleural effusion. No airspace consolidation. Posterolateral left upper lobe scar is unchanged.   Upper Abdomen: No acute abnormality. Gallstones. Left upper pole renal calcification measures 1.1 cm.   Musculoskeletal: No acute findings.   Review of the MIP images confirms the above findings.   IMPRESSION: 1. 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 Recommend semi-annual imaging followup by CTA or MRA and referral to cardiothoracic surgery if not already obtained. This recommendation follows 2010 ACCF/AHA/AATS/ACR/ASA/SCA/SCAI/SIR/STS/SVM Guidelines for the Diagnosis and Management of Patients With Thoracic Aortic Disease. Circulation. 2010; 121: [id number]. Aortic aneurysm NOS (ICD10-I71.9) 2. Status post aortic valve repair. 3. Gallstones. 4. Nonobstructing left renal calculus. 5. Aortic atherosclerosis.   Aortic Atherosclerosis (ICD10-I70.0).     Electronically Signed   By: Waddell Calk M.D.   On: 11/30/2019 10:02     Myoview  09/06/20: Study Highlights     The left ventricular ejection fraction is moderately decreased (30-44%). Nuclear stress EF: 42%. There was no ST segment deviation noted during stress. Defect 1: There is a medium defect of moderate severity present in the basal inferior, mid inferior and apex location. Defect 2: There is a small defect of moderate severity present in the apical inferior and apical lateral location. Findings consistent with ischemia. This is an intermediate risk study.   Abnormal study. TID is 1.21, cannot exclude balanced ischemia. There is a fixed defect in the inferior wall and apex that is moderately severe, with mild-moderate hypokinesis in this area. There is a separate small reversible defect of moderate severity in the apical and inferior  regions suggestive of ischemia. EF is moderately decreased, though would consider echo for definitive evaluation of function and wall motion.     Echo 09/16/20: IMPRESSIONS     1. Left ventricular ejection fraction, by estimation, is 65 to 70%. The  left ventricle has normal function. The left ventricle has no regional  wall motion abnormalities. There is mild left ventricular hypertrophy.  Left ventricular diastolic parameters  were normal.   2. Right ventricular systolic function is normal. The right ventricular  size is normal.   3. Mild mitral valve regurgitation.   4. 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 No  significant change from report of April 2021. . The aortic valve has been  repaired/replaced. Aortic valve  regurgitation is not visualized.   Risk Assessment/Calculations:             Physical Exam:   VS:  BP 128/70 (BP Location:  Right Arm, Patient Position: Sitting, Cuff Size: Normal)   Pulse (!) 55   Ht 5' 9 (1.753 m)   Wt 186 lb (84.4 kg)   BMI 27.47 kg/m    Orhtostatics: No data found. Wt Readings from Last 3 Encounters:  01/04/24 186 lb (84.4 kg)  12/17/23 184 lb (83.5 kg)  10/18/23 184 lb (83.5 kg)    GEN: Well nourished, well developed in no acute distress NECK: No JVD; No carotid bruits CARDIAC:  RRR, 2/6 systolic murmur LSB RESPIRATORY:  Clear to auscultation without rales, wheezing or rhonchi  ABDOMEN: Soft, non-tender, non-distended EXTREMITIES:  No edema; No deformity   ASSESSMENT AND PLAN: .    Preop clearance for right hip replacement by Dr. Lonni Poli 02/04/24  According to the Revised Cardiac Risk Index (RCRI), his Perioperative Risk of Major Cardiac Event is (%): 6.6 Patient doing well from cardiac standpoint without angina exercising regularly, METs>6. Periop risk is elevated with CAD, AAA, ITP but he is reasonable to proceed from a cardiac  standpoint without further cardiac w/u.  He is scheduled for f/u CT for his aneurysm.  His Functional Capacity in METs is: 6.05 according to the Duke Activity Status Index (DASI).   H/o Aortic root repair and AVR with a tissue valve.:   Repeat Echo in August 2022 showed good prosthetic valve function. Repeat CT in May showed stable 4.7 cm aneurysm.     CAD: asymptomatic.  80% mid nondominant RCA and a 50% distal LAD lesion on previous cardiac catheterization in 2018.  Abnormal myoview  with some inferior and apical ischemia. This was an intermediate risk study. Ordinarily would consider repeat coronary angiography but his bleeding risk is high with ITP and recurrent major lower GI bleeds. He is a poor candidate for PCI because of this. Per Dr. Jordan: I would not recommend invasive evaluation unless he were to have progressive angina despite medical therapy. Currently he has no significant angina.    Hypertension: Blood pressure is under good control on atenolol  and amlodipine .    Hyperlipidemia: LDL 32 02/2023 on crestor    ITP followed by Dr Timmy.    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 Followed by [facility] Dr Sheree. Now s/p endovascular repair on 11/28/21. CT on 12/26/21 showed no endoleak. For f/u CT Dr. Lucas 01/12/24    Recurrent GI bleed- diverticular. S/p coil embolization.          Dispo: f/u Dr. Jordan in 6 months.  Signed, Olivia Pavy, PA-C

## 2023-12-31 ENCOUNTER — Inpatient Hospital Stay

## 2023-12-31 ENCOUNTER — Inpatient Hospital Stay: Attending: Hematology & Oncology

## 2023-12-31 VITALS — BP 138/79 | HR 64 | Temp 98.6°F | Resp 19

## 2023-12-31 VITALS — BP 118/67 | HR 55 | Temp 98.1°F | Resp 20

## 2023-12-31 DIAGNOSIS — D693 Immune thrombocytopenic purpura: Secondary | ICD-10-CM | POA: Diagnosis present

## 2023-12-31 DIAGNOSIS — D509 Iron deficiency anemia, unspecified: Secondary | ICD-10-CM

## 2023-12-31 DIAGNOSIS — Z79899 Other long term (current) drug therapy: Secondary | ICD-10-CM | POA: Insufficient documentation

## 2023-12-31 LAB — CBC WITH DIFFERENTIAL (CANCER CENTER ONLY)
Abs Immature Granulocytes: 0.03 K/uL (ref 0.00–0.07)
Basophils Absolute: 0 K/uL (ref 0.0–0.1)
Basophils Relative: 0 %
Eosinophils Absolute: 0.2 K/uL (ref 0.0–0.5)
Eosinophils Relative: 2 %
HCT: 33.2 % — ABNORMAL LOW (ref 39.0–52.0)
Hemoglobin: 10.9 g/dL — ABNORMAL LOW (ref 13.0–17.0)
Immature Granulocytes: 0 %
Lymphocytes Relative: 12 %
Lymphs Abs: 1 K/uL (ref 0.7–4.0)
MCH: 30.7 pg (ref 26.0–34.0)
MCHC: 32.8 g/dL (ref 30.0–36.0)
MCV: 93.5 fL (ref 80.0–100.0)
Monocytes Absolute: 0.7 K/uL (ref 0.1–1.0)
Monocytes Relative: 9 %
Neutro Abs: 6 K/uL (ref 1.7–7.7)
Neutrophils Relative %: 77 %
Platelet Count: 91 K/uL — ABNORMAL LOW (ref 150–400)
RBC: 3.55 MIL/uL — ABNORMAL LOW (ref 4.22–5.81)
RDW: 13.6 % (ref 11.5–15.5)
WBC Count: 7.8 K/uL (ref 4.0–10.5)
nRBC: 0 % (ref 0.0–0.2)

## 2023-12-31 LAB — IRON AND IRON BINDING CAPACITY (CC-WL,HP ONLY)
Iron: 54 ug/dL (ref 45–182)
Saturation Ratios: 15 % — ABNORMAL LOW (ref 17.9–39.5)
TIBC: 368 ug/dL (ref 250–450)
UIBC: 314 ug/dL

## 2023-12-31 LAB — CMP (CANCER CENTER ONLY)
ALT: 9 U/L (ref 0–44)
AST: 21 U/L (ref 15–41)
Albumin: 4.6 g/dL (ref 3.5–5.0)
Alkaline Phosphatase: 47 U/L (ref 38–126)
Anion gap: 13 (ref 5–15)
BUN: 21 mg/dL (ref 8–23)
CO2: 24 mmol/L (ref 22–32)
Calcium: 9.7 mg/dL (ref 8.9–10.3)
Chloride: 102 mmol/L (ref 98–111)
Creatinine: 1.52 mg/dL — ABNORMAL HIGH (ref 0.61–1.24)
GFR, Estimated: 48 mL/min — ABNORMAL LOW (ref >60)
Glucose, Bld: 114 mg/dL — ABNORMAL HIGH (ref 70–99)
Potassium: 3.8 mmol/L (ref 3.5–5.1)
Sodium: 140 mmol/L (ref 135–145)
Total Bilirubin: 0.5 mg/dL (ref 0.0–1.2)
Total Protein: 7.1 g/dL (ref 6.5–8.1)

## 2023-12-31 LAB — FERRITIN: Ferritin: 66 ng/mL (ref 24–336)

## 2023-12-31 LAB — LACTATE DEHYDROGENASE: LDH: 269 U/L — ABNORMAL HIGH (ref 105–235)

## 2023-12-31 MED ORDER — SODIUM CHLORIDE 0.9 % IV SOLN: INTRAVENOUS | Status: DC

## 2023-12-31 MED ORDER — ROMIPLOSTIM 125 MCG ~~LOC~~ SOLR
95.0000 ug | Freq: Once | SUBCUTANEOUS | Status: AC
  Administered 2023-12-31: 95 ug via SUBCUTANEOUS
  Filled 2023-12-31: qty 0.19

## 2023-12-31 MED ORDER — SODIUM CHLORIDE 0.9 % IV SOLN
1000.0000 mg | Freq: Once | INTRAVENOUS | Status: AC
  Administered 2023-12-31: 1000 mg via INTRAVENOUS
  Filled 2023-12-31: qty 10

## 2023-12-31 NOTE — Patient Instructions (Signed)
 Romiplostim  Injection What is this medication? ROMIPLOSTIM  (roe mi PLOE stim) treats low levels of platelets in your body caused by immune thrombocytopenia (ITP). It is prescribed when other medications have not worked or cannot be tolerated. It may also be used to help people who have been exposed to high doses of radiation. It works by increasing the amount of platelets in your blood. This lowers the risk of bleeding. This medicine may be used for other purposes; ask your health care provider or pharmacist if you have questions. COMMON BRAND NAME(S): Nplate  What should I tell my care team before I take this medication? They need to know if you have any of these conditions: Blood clots Myelodysplastic syndrome An unusual or allergic reaction to romiplostim , mannitol , other medications, foods, dyes, or preservatives Pregnant or trying to get pregnant Breast-feeding How should I use this medication? This medication is injected under the skin. It is given by a care team in a hospital or clinic setting. A special MedGuide will be given to you before each treatment. Be sure to read this information carefully each time. Talk to your care team about the use of this medication in children. While it may be prescribed for children as young as newborns for selected conditions, precautions do apply. Overdosage: If you think you have taken too much of this medicine contact a poison control center or emergency room at once. NOTE: This medicine is only for you. Do not share this medicine with others. What if I miss a dose? Keep appointments for follow-up doses. It is important not to miss your dose. Call your care team if you are unable to keep an appointment. What may interact with this medication? Interactions are not expected. This list may not describe all possible interactions. Give your health care provider a list of all the medicines, herbs, non-prescription drugs, or dietary supplements you use. Also  tell them if you smoke, drink alcohol , or use illegal drugs. Some items may interact with your medicine. What should I watch for while using this medication? Visit your care team for regular checks on your progress. You may need blood work done while you are taking this medication. Your condition will be monitored carefully while you are receiving this medication. It is important not to miss any appointments. What side effects may I notice from receiving this medication? Side effects that you should report to your care team as soon as possible: Allergic reactions--skin rash, itching, hives, swelling of the face, lips, tongue, or throat Blood clot--pain, swelling, or warmth in the leg, shortness of breath, chest pain Side effects that usually do not require medical attention (report to your care team if they continue or are bothersome): Dizziness Joint pain Muscle pain Pain in the hands or feet Stomach pain Trouble sleeping This list may not describe all possible side effects. Call your doctor for medical advice about side effects. You may report side effects to FDA at 1-800-FDA-1088. Where should I keep my medication? This medication is given in a hospital or clinic. It will not be stored at home. NOTE: This sheet is a summary. It may not cover all possible information. If you have questions about this medicine, talk to your doctor, pharmacist, or health care provider.  2024 Elsevier/Gold Standard (2021-05-19 00:00:00)Ferric Derisomaltose  Injection What is this medication? FERRIC DERISOMALTOSE  (FER ik der EYE soe MAWL tose) treats low levels of iron  in your body (iron  deficiency anemia). Iron  is a mineral that plays an important role in making red  blood cells, which carry oxygen  from your lungs to the rest of your body. This medicine may be used for other purposes; ask your health care provider or pharmacist if you have questions. COMMON BRAND NAME(S): MONOFERRIC  What should I tell my care  team before I take this medication? They need to know if you have any of these conditions: High levels of iron  in the blood An unusual or allergic reaction to iron , other medications, foods, dyes, or preservatives Pregnant or trying to get pregnant Breastfeeding How should I use this medication? This medication is injected into a vein. It is given by your care team in a hospital or clinic setting. Talk to your care team about the use of this medication in children. Special care may be needed. Overdosage: If you think you have taken too much of this medicine contact a poison control center or emergency room at once. NOTE: This medicine is only for you. Do not share this medicine with others. What if I miss a dose? It is important not to miss your dose. Call your care team if you are unable to keep an appointment. What may interact with this medication? Do not take this medication with any of the following: Deferoxamine Dimercaprol Other iron  products This list may not describe all possible interactions. Give your health care provider a list of all the medicines, herbs, non-prescription drugs, or dietary supplements you use. Also tell them if you smoke, drink alcohol , or use illegal drugs. Some items may interact with your medicine. What should I watch for while using this medication? Visit your care team for regular checks on your progress. Tell your care team if your symptoms do not start to get better or if they get worse. You may need blood work done while you are taking this medication. You may need to eat more foods that contain iron . Talk to your care team. Foods that contain iron  include whole grains or cereals, dried fruits, beans, peas, leafy green vegetables, and organ meats (liver, kidney). What side effects may I notice from receiving this medication? Side effects that you should report to your care team as soon as possible: Allergic reactions--skin rash, itching, hives, swelling  of the face, lips, tongue, or throat Low blood pressure--dizziness, feeling faint or lightheaded, blurry vision Shortness of breath Side effects that usually do not require medical attention (report to your care team if they continue or are bothersome): Flushing Headache Joint pain Muscle pain Nausea Pain, redness, or irritation at injection site This list may not describe all possible side effects. Call your doctor for medical advice about side effects. You may report side effects to FDA at 1-800-FDA-1088. Where should I keep my medication? This medication is given in a hospital or clinic. It will not be stored at home. NOTE: This sheet is a summary. It may not cover all possible information. If you have questions about this medicine, talk to your doctor, pharmacist, or health care provider.  2024 Elsevier/Gold Standard (2022-09-02 00:00:00)

## 2023-12-31 NOTE — Patient Instructions (Signed)
 Romiplostim Injection What is this medication? ROMIPLOSTIM (roe mi PLOE stim) treats low levels of platelets in your body caused by immune thrombocytopenia (ITP). It is prescribed when other medications have not worked or cannot be tolerated. It may also be used to help people who have been exposed to high doses of radiation. It works by increasing the amount of platelets in your blood. This lowers the risk of bleeding. This medicine may be used for other purposes; ask your health care provider or pharmacist if you have questions. COMMON BRAND NAME(S): Nplate What should I tell my care team before I take this medication? They need to know if you have any of these conditions: Blood clots Myelodysplastic syndrome An unusual or allergic reaction to romiplostim, mannitol, other medications, foods, dyes, or preservatives Pregnant or trying to get pregnant Breast-feeding How should I use this medication? This medication is injected under the skin. It is given by a care team in a hospital or clinic setting. A special MedGuide will be given to you before each treatment. Be sure to read this information carefully each time. Talk to your care team about the use of this medication in children. While it may be prescribed for children as young as newborns for selected conditions, precautions do apply. Overdosage: If you think you have taken too much of this medicine contact a poison control center or emergency room at once. NOTE: This medicine is only for you. Do not share this medicine with others. What if I miss a dose? Keep appointments for follow-up doses. It is important not to miss your dose. Call your care team if you are unable to keep an appointment. What may interact with this medication? Interactions are not expected. This list may not describe all possible interactions. Give your health care provider a list of all the medicines, herbs, non-prescription drugs, or dietary supplements you use. Also  tell them if you smoke, drink alcohol, or use illegal drugs. Some items may interact with your medicine. What should I watch for while using this medication? Visit your care team for regular checks on your progress. You may need blood work done while you are taking this medication. Your condition will be monitored carefully while you are receiving this medication. It is important not to miss any appointments. What side effects may I notice from receiving this medication? Side effects that you should report to your care team as soon as possible: Allergic reactions--skin rash, itching, hives, swelling of the face, lips, tongue, or throat Blood clot--pain, swelling, or warmth in the leg, shortness of breath, chest pain Side effects that usually do not require medical attention (report to your care team if they continue or are bothersome): Dizziness Joint pain Muscle pain Pain in the hands or feet Stomach pain Trouble sleeping This list may not describe all possible side effects. Call your doctor for medical advice about side effects. You may report side effects to FDA at 1-800-FDA-1088. Where should I keep my medication? This medication is given in a hospital or clinic. It will not be stored at home. NOTE: This sheet is a summary. It may not cover all possible information. If you have questions about this medicine, talk to your doctor, pharmacist, or health care provider.  2024 Elsevier/Gold Standard (2021-05-19 00:00:00)

## 2024-01-04 ENCOUNTER — Encounter: Payer: Self-pay | Admitting: Physician Assistant

## 2024-01-04 ENCOUNTER — Ambulatory Visit: Attending: Physician Assistant | Admitting: Physician Assistant

## 2024-01-04 VITALS — BP 128/70 | HR 55 | Ht 69.0 in | Wt 186.0 lb

## 2024-01-04 DIAGNOSIS — I2510 Atherosclerotic heart disease of native coronary artery without angina pectoris: Secondary | ICD-10-CM | POA: Diagnosis not present

## 2024-01-04 DIAGNOSIS — I714 Abdominal aortic aneurysm, without rupture, unspecified: Secondary | ICD-10-CM | POA: Diagnosis not present

## 2024-01-04 DIAGNOSIS — I10 Essential (primary) hypertension: Secondary | ICD-10-CM | POA: Diagnosis not present

## 2024-01-04 DIAGNOSIS — D693 Immune thrombocytopenic purpura: Secondary | ICD-10-CM | POA: Diagnosis not present

## 2024-01-04 DIAGNOSIS — I2583 Coronary atherosclerosis due to lipid rich plaque: Secondary | ICD-10-CM | POA: Diagnosis not present

## 2024-01-04 DIAGNOSIS — Z952 Presence of prosthetic heart valve: Secondary | ICD-10-CM

## 2024-01-04 DIAGNOSIS — E785 Hyperlipidemia, unspecified: Secondary | ICD-10-CM | POA: Diagnosis not present

## 2024-01-04 NOTE — Patient Instructions (Addendum)
 Medication Instructions:  NO CHANGES.  Lab Work: NONE TO BE DONE TODAY.  Testing/Procedures: NONE  Follow-Up: At [facility], you and your health needs are our priority.  As part of our continuing mission to provide you with exceptional heart care, our providers are all part of one team.  This team includes your primary Cardiologist (physician) and Advanced Practice Providers or APPs (Physician Assistants and Nurse Practitioners) who all work together to provide you with the care you need, when you need it.  Your next appointment:   6 MONTHS  Provider:   Peter Jordan, MD

## 2024-01-10 ENCOUNTER — Inpatient Hospital Stay

## 2024-01-10 VITALS — BP 130/70 | HR 54 | Temp 98.7°F | Resp 18

## 2024-01-10 DIAGNOSIS — D693 Immune thrombocytopenic purpura: Secondary | ICD-10-CM

## 2024-01-10 DIAGNOSIS — D509 Iron deficiency anemia, unspecified: Secondary | ICD-10-CM

## 2024-01-10 LAB — CMP (CANCER CENTER ONLY)
ALT: 11 U/L (ref 0–44)
AST: 29 U/L (ref 15–41)
Albumin: 4.7 g/dL (ref 3.5–5.0)
Alkaline Phosphatase: 51 U/L (ref 38–126)
Anion gap: 10 (ref 5–15)
BUN: 20 mg/dL (ref 8–23)
CO2: 27 mmol/L (ref 22–32)
Calcium: 9.4 mg/dL (ref 8.9–10.3)
Chloride: 100 mmol/L (ref 98–111)
Creatinine: 1.36 mg/dL — ABNORMAL HIGH (ref 0.61–1.24)
GFR, Estimated: 55 mL/min — ABNORMAL LOW (ref >60)
Glucose, Bld: 103 mg/dL — ABNORMAL HIGH (ref 70–99)
Potassium: 4.3 mmol/L (ref 3.5–5.1)
Sodium: 137 mmol/L (ref 135–145)
Total Bilirubin: 0.5 mg/dL (ref 0.0–1.2)
Total Protein: 7.1 g/dL (ref 6.5–8.1)

## 2024-01-10 LAB — CBC WITH DIFFERENTIAL (CANCER CENTER ONLY)
Abs Immature Granulocytes: 0.03 K/uL (ref 0.00–0.07)
Basophils Absolute: 0 K/uL (ref 0.0–0.1)
Basophils Relative: 0 %
Eosinophils Absolute: 0.2 K/uL (ref 0.0–0.5)
Eosinophils Relative: 3 %
HCT: 34.4 % — ABNORMAL LOW (ref 39.0–52.0)
Hemoglobin: 11.3 g/dL — ABNORMAL LOW (ref 13.0–17.0)
Immature Granulocytes: 1 %
Lymphocytes Relative: 13 %
Lymphs Abs: 0.9 K/uL (ref 0.7–4.0)
MCH: 31 pg (ref 26.0–34.0)
MCHC: 32.8 g/dL (ref 30.0–36.0)
MCV: 94.5 fL (ref 80.0–100.0)
Monocytes Absolute: 0.5 K/uL (ref 0.1–1.0)
Monocytes Relative: 8 %
Neutro Abs: 4.9 K/uL (ref 1.7–7.7)
Neutrophils Relative %: 75 %
Platelet Count: 101 K/uL — ABNORMAL LOW (ref 150–400)
RBC: 3.64 MIL/uL — ABNORMAL LOW (ref 4.22–5.81)
RDW: 13.8 % (ref 11.5–15.5)
WBC Count: 6.5 K/uL (ref 4.0–10.5)
nRBC: 0 % (ref 0.0–0.2)

## 2024-01-10 MED ORDER — ROMIPLOSTIM 125 MCG ~~LOC~~ SOLR
95.0000 ug | Freq: Once | SUBCUTANEOUS | Status: AC
  Administered 2024-01-10: 13:00:00 95 ug via SUBCUTANEOUS
  Filled 2024-01-10: qty 0.19

## 2024-01-10 NOTE — Progress Notes (Signed)
 MD reviewed pt cbc plt 101. Give NPLATE . Tx released

## 2024-01-12 ENCOUNTER — Ambulatory Visit (HOSPITAL_COMMUNITY): Admission: RE | Admit: 2024-01-12 | Discharge: 2024-01-12 | Attending: Cardiology | Admitting: Cardiology

## 2024-01-12 DIAGNOSIS — I7121 Aneurysm of the ascending aorta, without rupture: Secondary | ICD-10-CM | POA: Diagnosis present

## 2024-01-12 MED ORDER — IOHEXOL 350 MG/ML SOLN
100.0000 mL | Freq: Once | INTRAVENOUS | Status: AC | PRN
  Administered 2024-01-12: 09:00:00 100 mL via INTRAVENOUS

## 2024-01-14 NOTE — Patient Instructions (Addendum)
 SURGICAL WAITING ROOM VISITATION Patients having surgery or a procedure may have no more than 2 support people in the waiting area - these visitors may rotate.    Children under the age of 70 will not be allowed to visit due to the increase in respiratory illness  Children under the age of 48 must have an adult with them who is not the patient.  If the patient needs to stay at the hospital during part of their recovery, the visitor guidelines for inpatient rooms apply. Pre-op nurse will coordinate an appropriate time for 1 support person to accompany patient in pre-op.  This support person may not rotate.    Please refer to the [facility] website for the visitor guidelines for Inpatients (after your surgery is over and you are in a regular room).       Your procedure is scheduled on: 02-24-24   Report to [facility] Main Entrance    Report to admitting at 5:15 AM   Call this number if you have problems the morning of surgery [phone]   Do not eat food :After Midnight.   After Midnight you may have the following liquids until 4:15 AM DAY OF SURGERY  Water Non-Citrus Juices (without pulp, NO RED-Apple, White grape, White cranberry) Black Coffee (NO MILK/CREAM OR CREAMERS, sugar ok)  Clear Tea (NO MILK/CREAM OR CREAMERS, sugar ok) regular and decaf                             Plain Jell-O (NO RED)                                           Fruit ices (not with fruit pulp, NO RED)                                     Popsicles (NO RED)                                                               Sports drinks like Gatorade (NO RED)                   The day of surgery:  Drink ONE (1) Pre-Surgery Clear Ensure or G2 by 4:15 AM the morning of surgery. Drink in one sitting. Do not sip.  This drink was given to you during your hospital  pre-op appointment visit. Nothing else to drink after completing the Pre-Surgery Clear Ensure or G2.          If you have questions,  please contact your surgeons office.   FOLLOW  ANY ADDITIONAL PRE OP INSTRUCTIONS YOU RECEIVED FROM YOUR SURGEON'S OFFICE!!!     Oral Hygiene is also important to reduce your risk of infection.                                    Remember - BRUSH YOUR TEETH THE MORNING OF SURGERY WITH YOUR REGULAR TOOTHPASTE   Do NOT  smoke after Midnight   Take these medicines the morning of surgery with A SIP OF WATER:    Amlodipine    Atenolol    Rosuvastatin    Okay to use Flonase  nasal spray and eyedrops                          pantoprazole   Stop all vitamins and herbal supplements 7 days before surgery  Bring CPAP mask and tubing day of surgery.                              You may not have any metal on your body including jewelry, and body piercing             Do not wear lotions, powders, cologne, or deodorant              Men may shave face and neck.   Do not bring valuables to the hospital. [facility] IS NOT RESPONSIBLE   FOR VALUABLES.   Contacts, dentures or bridgework may not be worn into surgery.   Bring small overnight bag day of surgery.   DO NOT BRING YOUR HOME MEDICATIONS TO THE HOSPITAL. PHARMACY WILL DISPENSE MEDICATIONS LISTED ON YOUR MEDICATION LIST TO YOU DURING YOUR ADMISSION IN THE HOSPITAL!     Special Instructions: Bring a copy of your healthcare power of attorney and living will documents the day of surgery if you haven't scanned them before.              Please read over the following fact sheets you were given: IF YOU HAVE QUESTIONS ABOUT YOUR PRE-OP INSTRUCTIONS PLEASE CALL [phone] [facility]  If you received a COVID test during your pre-op visit  it is requested that you wear a mask when out in public, stay away from anyone that may not be feeling well and notify your surgeon if you develop symptoms. If you test positive for Covid or have been in contact with anyone that has tested positive in the last 10 days please notify you surgeon.   Pre-operative 4 CHG  Bath Instructions  DYNA-Hex 4 Chlorhexidine  Gluconate 4% Solution Antiseptic 4 fl. oz   You can play a key role in reducing the risk of infection after surgery. Your skin needs to be as free of germs as possible. You can reduce the number of germs on your skin by washing with CHG (chlorhexidine  gluconate) soap before surgery. CHG is an antiseptic soap that kills germs and continues to kill germs even after washing.   DO NOT use if you have an allergy to chlorhexidine /CHG or antibacterial soaps. If your skin becomes reddened or irritated, stop using the CHG and notify one of our RNs at   Please shower with the CHG soap starting 4 days before surgery using the following schedule:     Please keep in mind the following:  DO NOT shave, including legs and underarms, starting the day of your first shower.   You may shave your face at any point before/day of surgery.  Place clean sheets on your bed the day you start using CHG soap. Use a clean washcloth (not used since being washed) for each shower. DO NOT sleep with pets once you start using the CHG.  CHG Shower Instructions:  If you choose to wash your hair and private area, wash first with your normal shampoo/soap.  After you use shampoo/soap, rinse your hair and  body thoroughly to remove shampoo/soap residue.  Turn the water OFF and apply about 3 tablespoons (45 ml) of CHG soap to a CLEAN washcloth.  Apply CHG soap ONLY FROM YOUR NECK DOWN TO YOUR TOES (washing for 3-5 minutes)  DO NOT use CHG soap on face, private areas, open wounds, or sores.  Pay special attention to the area where your surgery is being performed.  If you are having back surgery, having someone wash your back for you may be helpful. Wait 2 minutes after CHG soap is applied, then you may rinse off the CHG soap.  Pat dry with a clean towel  Put on clean clothes/pajamas   If you choose to wear lotion, please use ONLY the CHG-compatible lotions on the back of this paper.      Additional instructions for the day of surgery:  Shower with regular soap the day of surgery DO NOT APPLY any lotions, deodorants, cologne, or perfumes.   Put on clean/comfortable clothes.  Brush your teeth.  Ask your nurse before applying any prescription medications to the skin.   CHG Compatible Lotions   Aveeno Moisturizing lotion  Cetaphil Moisturizing Cream  Cetaphil Moisturizing Lotion  Clairol Herbal Essence Moisturizing Lotion, Dry Skin  Clairol Herbal Essence Moisturizing Lotion, Extra Dry Skin  Clairol Herbal Essence Moisturizing Lotion, Normal Skin  Curel Age Defying Therapeutic Moisturizing Lotion with Alpha Hydroxy  Curel Extreme Care Body Lotion  Curel Soothing Hands Moisturizing Hand Lotion  Curel Therapeutic Moisturizing Cream, Fragrance-Free  Curel Therapeutic Moisturizing Lotion, Fragrance-Free  Curel Therapeutic Moisturizing Lotion, Original Formula  Eucerin Daily Replenishing Lotion  Eucerin Dry Skin Therapy Plus Alpha Hydroxy Crme  Eucerin Dry Skin Therapy Plus Alpha Hydroxy Lotion  Eucerin Original Crme  Eucerin Original Lotion  Eucerin Plus Crme Eucerin Plus Lotion  Eucerin TriLipid Replenishing Lotion  Keri Anti-Bacterial Hand Lotion  Keri Deep Conditioning Original Lotion Dry Skin Formula Softly Scented  Keri Deep Conditioning Original Lotion, Fragrance Free Sensitive Skin Formula  Keri Lotion Fast Absorbing Fragrance Free Sensitive Skin Formula  Keri Lotion Fast Absorbing Softly Scented Dry Skin Formula  Keri Original Lotion  Keri Skin Renewal Lotion Keri Silky Smooth Lotion  Keri Silky Smooth Sensitive Skin Lotion  Nivea Body Creamy Conditioning Oil  Nivea Body Extra Enriched Lotion  Nivea Body Original Lotion  Nivea Body Sheer Moisturizing Lotion Nivea Crme  Nivea Skin Firming Lotion  NutraDerm 30 Skin Lotion  NutraDerm Skin Lotion  NutraDerm Therapeutic Skin Cream  NutraDerm Therapeutic Skin Lotion  ProShield Protective Hand Cream   Provon moisturizing lotion   PATIENT SIGNATURE_________________________________  NURSE SIGNATURE__________________________________  ________________________________________________________________________    Lonnie Hansen  An incentive spirometer is a tool that can help keep your lungs clear and active. This tool measures how well you are filling your lungs with each breath. Taking long deep breaths may help reverse or decrease the chance of developing breathing (pulmonary) problems (especially infection) following: A long period of time when you are unable to move or be active. BEFORE THE PROCEDURE  If the spirometer includes an indicator to show your best effort, your nurse or respiratory therapist will set it to a desired goal. If possible, sit up straight or lean slightly forward. Try not to slouch. Hold the incentive spirometer in an upright position. INSTRUCTIONS FOR USE  Sit on the edge of your bed if possible, or sit up as far as you can in bed or on a chair. Hold the incentive spirometer in an upright position. Breathe out  normally. Place the mouthpiece in your mouth and seal your lips tightly around it. Breathe in slowly and as deeply as possible, raising the piston or the ball toward the top of the column. Hold your breath for 3-5 seconds or for as long as possible. Allow the piston or ball to fall to the bottom of the column. Remove the mouthpiece from your mouth and breathe out normally. Rest for a few seconds and repeat Steps 1 through 7 at least 10 times every 1-2 hours when you are awake. Take your time and take a few normal breaths between deep breaths. The spirometer may include an indicator to show your best effort. Use the indicator as a goal to work toward during each repetition. After each set of 10 deep breaths, practice coughing to be sure your lungs are clear. If you have an incision (the cut made at the time of surgery), support your incision when coughing  by placing a pillow or rolled up towels firmly against it. Once you are able to get out of bed, walk around indoors and cough well. You may stop using the incentive spirometer when instructed by your caregiver.  RISKS AND COMPLICATIONS Take your time so you do not get dizzy or light-headed. If you are in pain, you may need to take or ask for pain medication before doing incentive spirometry. It is harder to take a deep breath if you are having pain. AFTER USE Rest and breathe slowly and easily. It can be helpful to keep track of a log of your progress. Your caregiver can provide you with a simple table to help with this. If you are using the spirometer at home, follow these instructions: SEEK MEDICAL CARE IF:  You are having difficultly using the spirometer. You have trouble using the spirometer as often as instructed. Your pain medication is not giving enough relief while using the spirometer. You develop fever of 100.5 F (38.1 C) or higher. SEEK IMMEDIATE MEDICAL CARE IF:  You cough up bloody sputum that had not been present before. You develop fever of 102 F (38.9 C) or greater. You develop worsening pain at or near the incision site. MAKE SURE YOU:  Understand these instructions. Will watch your condition. Will get help right away if you are not doing well or get worse. Document Released: 05/25/2006 Document Revised: 04/06/2011 Document Reviewed: 07/26/2006 ExitCare Patient Information 2014 [employer], [place].   ________________________________________________________________________ WHAT IS A BLOOD TRANSFUSION? Blood Transfusion Information  A transfusion is the replacement of blood or some of its parts. Blood is made up of multiple cells which provide different functions. Red blood cells carry oxygen  and are used for blood loss replacement. White blood cells fight against infection. Platelets control bleeding. Plasma helps clot blood. Other blood products are available for  specialized needs, such as hemophilia or other clotting disorders. BEFORE THE TRANSFUSION  Who gives blood for transfusions?  Healthy volunteers who are fully evaluated to make sure their blood is safe. This is blood bank blood. Transfusion therapy is the safest it has ever been in the practice of medicine. Before blood is taken from a donor, a complete history is taken to make sure that person has no history of diseases nor engages in risky social behavior (examples are intravenous drug use or sexual activity with multiple partners). The donor's travel history is screened to minimize risk of transmitting infections, such as malaria. The donated blood is tested for signs of infectious diseases, such as HIV and hepatitis. The  blood is then tested to be sure it is compatible with you in order to minimize the chance of a transfusion reaction. If you or a relative donates blood, this is often done in anticipation of surgery and is not appropriate for emergency situations. It takes many days to process the donated blood. RISKS AND COMPLICATIONS Although transfusion therapy is very safe and saves many lives, the main dangers of transfusion include:  Getting an infectious disease. Developing a transfusion reaction. This is an allergic reaction to something in the blood you were given. Every precaution is taken to prevent this. The decision to have a blood transfusion has been considered carefully by your caregiver before blood is given. Blood is not given unless the benefits outweigh the risks. AFTER THE TRANSFUSION Right after receiving a blood transfusion, you will usually feel much better and more energetic. This is especially true if your red blood cells have gotten low (anemic). The transfusion raises the level of the red blood cells which carry oxygen , and this usually causes an energy increase. The nurse administering the transfusion will monitor you carefully for complications. HOME CARE INSTRUCTIONS   No special instructions are needed after a transfusion. You may find your energy is better. Speak with your caregiver about any limitations on activity for underlying diseases you may have. SEEK MEDICAL CARE IF:  Your condition is not improving after your transfusion. You develop redness or irritation at the intravenous (IV) site. SEEK IMMEDIATE MEDICAL CARE IF:  Any of the following symptoms occur over the next 12 hours: Shaking chills. You have a temperature by mouth above 102 F (38.9 C), not controlled by medicine. Chest, back, or muscle pain. People around you feel you are not acting correctly or are confused. Shortness of breath or difficulty breathing. Dizziness and fainting. You get a rash or develop hives. You have a decrease in urine output. Your urine turns a dark color or changes to pink, red, or brown. Any of the following symptoms occur over the next 10 days: You have a temperature by mouth above 102 F (38.9 C), not controlled by medicine. Shortness of breath. Weakness after normal activity. The white part of the eye turns yellow (jaundice). You have a decrease in the amount of urine or are urinating less often. Your urine turns a dark color or changes to pink, red, or brown. Document Released: 01/10/2000 Document Revised: 04/06/2011 Document Reviewed: 08/29/2007 [facility] Patient Information 2014 [place], [place].  _______________________________________________________________________

## 2024-01-17 ENCOUNTER — Encounter: Payer: Self-pay | Admitting: Family

## 2024-01-17 ENCOUNTER — Inpatient Hospital Stay

## 2024-01-17 ENCOUNTER — Inpatient Hospital Stay: Admitting: Family

## 2024-01-17 VITALS — BP 158/85 | HR 58 | Temp 98.8°F | Resp 18 | Wt 182.0 lb

## 2024-01-17 DIAGNOSIS — D693 Immune thrombocytopenic purpura: Secondary | ICD-10-CM

## 2024-01-17 DIAGNOSIS — D509 Iron deficiency anemia, unspecified: Secondary | ICD-10-CM

## 2024-01-17 LAB — CBC WITH DIFFERENTIAL (CANCER CENTER ONLY)
Abs Immature Granulocytes: 0.03 K/uL (ref 0.00–0.07)
Basophils Absolute: 0 K/uL (ref 0.0–0.1)
Basophils Relative: 0 %
Eosinophils Absolute: 0.2 K/uL (ref 0.0–0.5)
Eosinophils Relative: 2 %
HCT: 35.7 % — ABNORMAL LOW (ref 39.0–52.0)
Hemoglobin: 11.7 g/dL — ABNORMAL LOW (ref 13.0–17.0)
Immature Granulocytes: 0 %
Lymphocytes Relative: 13 %
Lymphs Abs: 0.9 K/uL (ref 0.7–4.0)
MCH: 31 pg (ref 26.0–34.0)
MCHC: 32.8 g/dL (ref 30.0–36.0)
MCV: 94.4 fL (ref 80.0–100.0)
Monocytes Absolute: 0.7 K/uL (ref 0.1–1.0)
Monocytes Relative: 11 %
Neutro Abs: 4.9 K/uL (ref 1.7–7.7)
Neutrophils Relative %: 74 %
Platelet Count: 109 K/uL — ABNORMAL LOW (ref 150–400)
RBC: 3.78 MIL/uL — ABNORMAL LOW (ref 4.22–5.81)
RDW: 14.5 % (ref 11.5–15.5)
WBC Count: 6.7 K/uL (ref 4.0–10.5)
nRBC: 0 % (ref 0.0–0.2)

## 2024-01-17 LAB — CMP (CANCER CENTER ONLY)
ALT: 10 U/L (ref 0–44)
AST: 27 U/L (ref 15–41)
Albumin: 4.7 g/dL (ref 3.5–5.0)
Alkaline Phosphatase: 51 U/L (ref 38–126)
Anion gap: 11 (ref 5–15)
BUN: 23 mg/dL (ref 8–23)
CO2: 27 mmol/L (ref 22–32)
Calcium: 9.5 mg/dL (ref 8.9–10.3)
Chloride: 103 mmol/L (ref 98–111)
Creatinine: 1.48 mg/dL — ABNORMAL HIGH (ref 0.61–1.24)
GFR, Estimated: 49 mL/min — ABNORMAL LOW
Glucose, Bld: 98 mg/dL (ref 70–99)
Potassium: 4.5 mmol/L (ref 3.5–5.1)
Sodium: 140 mmol/L (ref 135–145)
Total Bilirubin: 0.6 mg/dL (ref 0.0–1.2)
Total Protein: 7.2 g/dL (ref 6.5–8.1)

## 2024-01-17 LAB — LACTATE DEHYDROGENASE: LDH: 277 U/L — ABNORMAL HIGH (ref 105–235)

## 2024-01-17 MED ORDER — ROMIPLOSTIM 125 MCG ~~LOC~~ SOLR
1.0000 ug/kg | Freq: Once | SUBCUTANEOUS | Status: DC
  Filled 2024-01-17: qty 0.17

## 2024-01-17 MED ORDER — ROMIPLOSTIM 125 MCG ~~LOC~~ SOLR
95.0000 ug | Freq: Once | SUBCUTANEOUS | Status: AC
  Administered 2024-01-17: 95 ug via SUBCUTANEOUS
  Filled 2024-01-17: qty 0.19

## 2024-01-17 NOTE — Patient Instructions (Signed)
 Romiplostim Injection What is this medication? ROMIPLOSTIM (roe mi PLOE stim) treats low levels of platelets in your body caused by immune thrombocytopenia (ITP). It is prescribed when other medications have not worked or cannot be tolerated. It may also be used to help people who have been exposed to high doses of radiation. It works by increasing the amount of platelets in your blood. This lowers the risk of bleeding. This medicine may be used for other purposes; ask your health care provider or pharmacist if you have questions. COMMON BRAND NAME(S): Nplate What should I tell my care team before I take this medication? They need to know if you have any of these conditions: Blood clots Myelodysplastic syndrome An unusual or allergic reaction to romiplostim, mannitol, other medications, foods, dyes, or preservatives Pregnant or trying to get pregnant Breast-feeding How should I use this medication? This medication is injected under the skin. It is given by a care team in a hospital or clinic setting. A special MedGuide will be given to you before each treatment. Be sure to read this information carefully each time. Talk to your care team about the use of this medication in children. While it may be prescribed for children as young as newborns for selected conditions, precautions do apply. Overdosage: If you think you have taken too much of this medicine contact a poison control center or emergency room at once. NOTE: This medicine is only for you. Do not share this medicine with others. What if I miss a dose? Keep appointments for follow-up doses. It is important not to miss your dose. Call your care team if you are unable to keep an appointment. What may interact with this medication? Interactions are not expected. This list may not describe all possible interactions. Give your health care provider a list of all the medicines, herbs, non-prescription drugs, or dietary supplements you use. Also  tell them if you smoke, drink alcohol, or use illegal drugs. Some items may interact with your medicine. What should I watch for while using this medication? Visit your care team for regular checks on your progress. You may need blood work done while you are taking this medication. Your condition will be monitored carefully while you are receiving this medication. It is important not to miss any appointments. What side effects may I notice from receiving this medication? Side effects that you should report to your care team as soon as possible: Allergic reactions--skin rash, itching, hives, swelling of the face, lips, tongue, or throat Blood clot--pain, swelling, or warmth in the leg, shortness of breath, chest pain Side effects that usually do not require medical attention (report to your care team if they continue or are bothersome): Dizziness Joint pain Muscle pain Pain in the hands or feet Stomach pain Trouble sleeping This list may not describe all possible side effects. Call your doctor for medical advice about side effects. You may report side effects to FDA at 1-800-FDA-1088. Where should I keep my medication? This medication is given in a hospital or clinic. It will not be stored at home. NOTE: This sheet is a summary. It may not cover all possible information. If you have questions about this medicine, talk to your doctor, pharmacist, or health care provider.  2024 Elsevier/Gold Standard (2021-05-19 00:00:00)

## 2024-01-17 NOTE — Progress Notes (Signed)
 Sent message, via epic in basket, requesting orders in epic from Careers adviser.

## 2024-01-17 NOTE — Progress Notes (Signed)
 " Hematology and Oncology Follow Up Visit  Lonnie Hansen [id number] 1950/11/29 73 y.o. 01/17/2024   Principle Diagnosis:  Chronic ITP   Current Therapy:        Nplate  for platelets below 100,000   Interim History:  Lonnie Hansen is here today for follow-up. He is doing well preparing for his upcoming hip replacement surgery.  We continue to check labs and give Nplate  weekly to help maintain his counts.  No issues with bleeding. No abnormal bruising or petechiae.  No fever, chills, n/v, cough, rash, dizziness, SOB, chest pain, palpitations, abdominal pain or changes in bowel or bladder habits.  No falls or syncope reported. He is ambulating with a cane for added support.  He is still getting out an playing music for events which makes him happy.  Appetite and hydration are good. Weight is stable at 182 lbs.   ECOG Performance Status: 1 - Symptomatic but completely ambulatory  Medications:  Allergies as of 01/17/2024   No Known Allergies      Medication List        Accurate as of January 17, 2024  2:43 PM. If you have any questions, ask your nurse or doctor.          amLODipine  10 MG tablet Commonly known as: NORVASC  Take 10 mg by mouth daily.   Aspirin -Acetaminophen  500-325 MG Pack Take 1 packet by mouth daily as needed.   atenolol  25 MG tablet Commonly known as: TENORMIN  Take 12.5 mg by mouth 2 (two) times daily.   benzonatate 100 MG capsule Commonly known as: TESSALON Take 100 mg by mouth 3 (three) times daily as needed.   fluticasone  50 MCG/ACT nasal spray Commonly known as: FLONASE  Place 1 spray into both nostrils daily.   hydrocortisone  cream 1 % Apply topically 2 (two) times daily.   latanoprost  0.005 % ophthalmic solution Commonly known as: XALATAN  Place 1 drop into both eyes 2 (two) times daily.   losartan -hydrochlorothiazide  100-12.5 MG tablet Commonly known as: HYZAAR Take 0.5 tablets by mouth daily. 1/2 tablet in the morning, 1/2 tablet at  night time.   multivitamin with minerals Tabs tablet Take 1 tablet by mouth daily.   pantoprazole  40 MG tablet Commonly known as: PROTONIX  Take 40 mg by mouth 2 (two) times daily.   rosuvastatin  20 MG tablet Commonly known as: CRESTOR  Take 20 mg by mouth daily.   tamsulosin 0.4 MG Caps capsule Commonly known as: FLOMAX Take 0.4 mg by mouth at bedtime.        Allergies: Allergies[1]  Past Medical History, Surgical history, Social history, and Family History were reviewed and updated.  Review of Systems: All other 10 point review of systems is negative.   Physical Exam:  vitals were not taken for this visit.   Wt Readings from Last 3 Encounters:  01/04/24 186 lb (84.4 kg)  12/17/23 184 lb (83.5 kg)  10/18/23 184 lb (83.5 kg)    Ocular: Sclerae unicteric, pupils equal, round and reactive to light Ear-nose-throat: Oropharynx clear, dentition fair Lymphatic: No cervical or supraclavicular adenopathy Lungs no rales or rhonchi, good excursion bilaterally Heart regular rate and rhythm, no murmur appreciated Abd soft, nontender, positive bowel sounds MSK no focal spinal tenderness, no joint edema Neuro: non-focal, well-oriented, appropriate affect Breasts: Deferred   Lab Results  Component Value Date   WBC 6.5 01/10/2024   HGB 11.3 (L) 01/10/2024   HCT 34.4 (L) 01/10/2024   MCV 94.5 01/10/2024   PLT 101 (L) 01/10/2024  Lab Results  Component Value Date   FERRITIN 66 12/31/2023   IRON  54 12/31/2023   TIBC 368 12/31/2023   UIBC 314 12/31/2023   IRONPCTSAT 15 (L) 12/31/2023   Lab Results  Component Value Date   RETICCTPCT 4.2 (H) 10/12/2022   RBC 3.64 (L) 01/10/2024   No results found for: KPAFRELGTCHN, LAMBDASER, KAPLAMBRATIO No results found for: IGGSERUM, IGA, IGMSERUM No results found for: STEPHANY RINGS, A1GS, A2GS, EARLA JOANNIE DOC VICK, SPEI   Chemistry      Component Value Date/Time   NA 137  01/10/2024 1239   NA 140 05/11/2019 0946   K 4.3 01/10/2024 1239   CL 100 01/10/2024 1239   CO2 27 01/10/2024 1239   BUN 20 01/10/2024 1239   BUN 17 05/11/2019 0946   CREATININE 1.36 (H) 01/10/2024 1239   CREATININE 0.75 04/10/2016 1132      Component Value Date/Time   CALCIUM  9.4 01/10/2024 1239   ALKPHOS 51 01/10/2024 1239   AST 29 01/10/2024 1239   ALT 11 01/10/2024 1239   BILITOT 0.5 01/10/2024 1239       Impression and Plan: Lonnie Hansen is a very pleasant 73 yo African American gentleman with chronic ITP.   Nplate  given for platelet count of 109.  Lab check and injection 12/29 and 1/5. Surgery currently scheduled fro 02/04/2024.  Follow-up in 1 months.   Lauraine Pepper, NP 12/22/20252:43 PM     [1] No Known Allergies  "

## 2024-01-21 NOTE — Progress Notes (Signed)
 Second request for pre op orders left voicemail for [employer].

## 2024-01-24 ENCOUNTER — Inpatient Hospital Stay

## 2024-01-24 DIAGNOSIS — D509 Iron deficiency anemia, unspecified: Secondary | ICD-10-CM

## 2024-01-24 DIAGNOSIS — D693 Immune thrombocytopenic purpura: Secondary | ICD-10-CM

## 2024-01-24 LAB — CBC WITH DIFFERENTIAL (CANCER CENTER ONLY)
Abs Immature Granulocytes: 0.04 K/uL (ref 0.00–0.07)
Basophils Absolute: 0 K/uL (ref 0.0–0.1)
Basophils Relative: 0 %
Eosinophils Absolute: 0.2 K/uL (ref 0.0–0.5)
Eosinophils Relative: 2 %
HCT: 35.9 % — ABNORMAL LOW (ref 39.0–52.0)
Hemoglobin: 12.2 g/dL — ABNORMAL LOW (ref 13.0–17.0)
Immature Granulocytes: 1 %
Lymphocytes Relative: 15 %
Lymphs Abs: 1.1 K/uL (ref 0.7–4.0)
MCH: 31.6 pg (ref 26.0–34.0)
MCHC: 34 g/dL (ref 30.0–36.0)
MCV: 93 fL (ref 80.0–100.0)
Monocytes Absolute: 0.9 K/uL (ref 0.1–1.0)
Monocytes Relative: 11 %
Neutro Abs: 5.5 K/uL (ref 1.7–7.7)
Neutrophils Relative %: 71 %
Platelet Count: 140 K/uL — ABNORMAL LOW (ref 150–400)
RBC: 3.86 MIL/uL — ABNORMAL LOW (ref 4.22–5.81)
RDW: 13.6 % (ref 11.5–15.5)
WBC Count: 7.7 K/uL (ref 4.0–10.5)
nRBC: 0 % (ref 0.0–0.2)

## 2024-01-24 LAB — CMP (CANCER CENTER ONLY)
ALT: 12 U/L (ref 0–44)
AST: 25 U/L (ref 15–41)
Albumin: 4.7 g/dL (ref 3.5–5.0)
Alkaline Phosphatase: 51 U/L (ref 38–126)
Anion gap: 12 (ref 5–15)
BUN: 28 mg/dL — ABNORMAL HIGH (ref 8–23)
CO2: 25 mmol/L (ref 22–32)
Calcium: 9.7 mg/dL (ref 8.9–10.3)
Chloride: 102 mmol/L (ref 98–111)
Creatinine: 1.62 mg/dL — ABNORMAL HIGH (ref 0.61–1.24)
GFR, Estimated: 44 mL/min — ABNORMAL LOW
Glucose, Bld: 106 mg/dL — ABNORMAL HIGH (ref 70–99)
Potassium: 4.2 mmol/L (ref 3.5–5.1)
Sodium: 139 mmol/L (ref 135–145)
Total Bilirubin: 0.6 mg/dL (ref 0.0–1.2)
Total Protein: 7.5 g/dL (ref 6.5–8.1)

## 2024-01-24 NOTE — Patient Instructions (Signed)
 [facility] - A DEPT OF . [facility]  Discharge Instructions: Thank you for choosing [facility] to provide your oncology and hematology care.   If you have a lab appointment with the Cancer Center, please go directly to the Cancer Center and check in at the registration area.  Wear comfortable clothing and clothing appropriate for easy access to any Portacath or PICC line.   We strive to give you quality time with your provider. You may need to reschedule your appointment if you arrive late (15 or more minutes).  Arriving late affects you and other patients whose appointments are after yours.  Also, if you miss three or more appointments without notifying the office, you may be dismissed from the clinic at the provider's discretion.      For prescription refill requests, have your pharmacy contact our office and allow 72 hours for refills to be completed.      To help prevent nausea and vomiting after your treatment, we encourage you to take your nausea medication as directed.  BELOW ARE SYMPTOMS THAT SHOULD BE REPORTED IMMEDIATELY: *FEVER GREATER THAN 100.4 F (38 C) OR HIGHER *CHILLS OR SWEATING *NAUSEA AND VOMITING THAT IS NOT CONTROLLED WITH YOUR NAUSEA MEDICATION *UNUSUAL SHORTNESS OF BREATH *UNUSUAL BRUISING OR BLEEDING *URINARY PROBLEMS (pain or burning when urinating, or frequent urination) *BOWEL PROBLEMS (unusual diarrhea, constipation, pain near the anus) TENDERNESS IN MOUTH AND THROAT WITH OR WITHOUT PRESENCE OF ULCERS (sore throat, sores in mouth, or a toothache) UNUSUAL RASH, SWELLING OR PAIN  UNUSUAL VAGINAL DISCHARGE OR ITCHING   Items with * indicate a potential emergency and should be followed up as soon as possible or go to the Emergency Department if any problems should occur.  Please show the CHEMOTHERAPY ALERT CARD or IMMUNOTHERAPY ALERT CARD at check-in to the Emergency Department and triage nurse. Should you have  questions after your visit or need to cancel or reschedule your appointment, please contact [facility] - A DEPT OF [facility]  [phone] and follow the prompts.  Office hours are 8:00 a.m. to 4:30 p.m. Monday - Friday. Please note that voicemails left after 4:00 p.m. may not be returned until the following business day.  We are closed weekends and major holidays. You have access to a nurse at all times for urgent questions. Please call the main number to the clinic [phone] and follow the prompts.  For any non-urgent questions, you may also contact your provider using MyChart. We now offer e-Visits for anyone 59 and older to request care online for non-urgent symptoms. For details visit [email or web address].   Also download the MyChart app! Go to the app store, search MyChart, open the app, select , and log in with your MyChart username and password.

## 2024-01-24 NOTE — Progress Notes (Signed)
 No Nplate  today, platelets 140,000. Results given to pt.

## 2024-01-25 ENCOUNTER — Encounter (HOSPITAL_COMMUNITY): Payer: Self-pay

## 2024-01-25 ENCOUNTER — Other Ambulatory Visit: Payer: Self-pay

## 2024-01-25 ENCOUNTER — Encounter (HOSPITAL_COMMUNITY)
Admission: RE | Admit: 2024-01-25 | Discharge: 2024-01-25 | Disposition: A | Discharge status: Home / Self Care | Source: Ambulatory Visit | Attending: Orthopaedic Surgery | Admitting: Orthopaedic Surgery

## 2024-01-25 VITALS — BP 130/77 | HR 61 | Temp 97.9°F | Resp 17 | Ht 69.0 in | Wt 181.0 lb

## 2024-01-25 DIAGNOSIS — Z01812 Encounter for preprocedural laboratory examination: Secondary | ICD-10-CM | POA: Diagnosis present

## 2024-01-25 DIAGNOSIS — M1611 Unilateral primary osteoarthritis, right hip: Secondary | ICD-10-CM | POA: Diagnosis not present

## 2024-01-25 LAB — SURGICAL PCR SCREEN
MRSA, PCR: NEGATIVE
Staphylococcus aureus: NEGATIVE

## 2024-01-25 NOTE — Progress Notes (Addendum)
 PCP - Dr. Jakie Mosses medical  Cardiologist - Dr. Peter Jordan  ONCO-Dr. Timmy Dr. Lucas Thoracic surgeon  Has appt Dec. 31  2025  PPM/ICD -  Device Orders -  Rep Notified -   Chest x-ray - CT chest 07-14-23 epic EKG - 01-04-24 epic Stress Test - 09-06-20 epic ECHO - 09-16-20 epic Cardiac Cath - 04-16-16 epic Coronary CT 08-28-15 epic Labs 01-24-24 epic  Sleep Study - yes CPAP - yes  Fasting Blood Sugar -  Checks Blood Sugar __n/a___ times a day  Blood Thinner Instructions:n/a Aspirin  Instructions:n/a  ERAS Protcol - PRE-SURGERY Ensure    COVID vaccine -yes  Activity--Able to climb a flight of stairs with no CP or SOB  Anesthesia review: CAD, HTN, OSA, ITP, Carotid artery stenosis, Thoracic Aneurysm, difficult airway noted in FYI in epic pt. Denies this.  Patient denies shortness of breath, fever, cough and chest pain at PAT appointment   All instructions explained to the patient, with a verbal understanding of the material. Patient agrees to go over the instructions while at home for a better understanding. Patient also instructed to self quarantine after being tested for COVID-19. The opportunity to ask questions was provided.

## 2024-01-26 ENCOUNTER — Ambulatory Visit: Admitting: Surgery

## 2024-01-28 NOTE — Progress Notes (Signed)
 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  Patient follows with Cardiology for nonobstructive CAD and severe AI with aortic root dilatation s/p repair in 2018. Last seen on 01/04/24 by PA Lenze for pre op clearance.   Preop clearance for right hip replacement by Dr. Lonni Vernetta 02/04/24  According to the Revised Cardiac Risk Index (RCRI), his Perioperative Risk of Major Cardiac Event is (%): 6.6 Patient doing well from cardiac standpoint without angina exercising regularly, METs>6. Periop risk is elevated with CAD, AAA, ITP but he is reasonable to proceed from a cardiac standpoint without further cardiac w/u.  He is scheduled for f/u CT for his aneurysm. His Functional Capacity in METs is: 6.05 according to the Duke Activity Status Index (DASI).  Patient follows with Vascular for hx of AAA s/p endovascular aneurysm repair and coil embolization of the right hypogastric artery by Dr. Sheree on 11/28/2021. He underwent stenting of the left hypogastric artery for an enlarging aneurysm by Dr. Sheree on 10/05/2022. This was complicated by postop bleeding and required open exploration of the left CFA  with repair and stenting of the left hypogastric artery and open exploration of the left common and external iliac arteries via a retroperitoneal incision. He was last seen by Dr. Sheree on 05/26/23. Imaging stable and advised 1 year f/u. Advised ok to undergo hip surgery.  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 He had updated imaging done on 01/12/24 which showed a stable TAA to 5.2cm. Seen by PA Fenyak on 1/7. Advised f/u in 6 months for repeat imaging.  Follows with Hematology for ITP. He has been receiving weekly Nplate  infusions to help with platelet counts pre operatively. Platelets on 1/5 were 120   VS: BP 130/77 (BP Location: Right Arm)   Pulse 61   Temp 36.6 C (Oral)   Resp 17   Ht 5' 9 (1.753 m)   Wt 82.1 kg   SpO2 100%   BMI 26.73 kg/m   PROVIDERS: Yolande Toribio MATSU, MD Jordan, Peter, MD is cardiologist Lucas Pride, MD is CT surgeon  Sheree Riis, MD is vascular surgeon Timmy Coy, MD is HEM  LABS: Labs reviewed: Acceptable for surgery. (all labs ordered are listed, but only abnormal results are displayed)  Labs Reviewed  SURGICAL PCR SCREEN  TYPE AND SCREEN     CTA Chest/aorta 01/12/24:   IMPRESSION: 1. 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 Recommend semi-annual imaging followup by CTA or MRA and referral to cardiothoracic surgery if not already obtained. This recommendation follows 2010 ACCF/AHA/AATS/ACR/ASA/SCA/SCAI/SIR/STS/SVM Guidelines for the Diagnosis  and Management of Patients With Thoracic Aortic Disease. Circulation. 2010; 121: Z733-z630. Aortic aneurysm NOS (ICD10-I71.9). 2. Mild opacification over the lingula and adjacent left lower lobe which may be due to atelectasis or infection. 3. Aortic atherosclerosis.  Atherosclerotic coronary artery disease. 4. Stable subcentimeter liver hypodensities likely cysts or hemangiomas. 5. Stable cystic change of the kidneys. 6. Partially visualized infrarenal aortic stent graft unchanged.   Aortic Atherosclerosis (ICD10-I70.0).   EKG 01/04/24:  NSR   VAS US  EVAR Duplex 05/26/23:  Summary: Abdominal Aorta: Patent endovascular aneurysm repair with no evidence of endoleak. The largest aortic diameter has decreased compared to prior exam. Previous diameter measurement was 5.5 cm obtained on 11/03/2022 on CT angio.  Echo 09/16/20:  IMPRESSIONS   1. Left ventricular ejection fraction, by estimation, is 65 to 70%. The  left ventricle has normal function. The left ventricle has no regional  wall motion abnormalities. There is mild left ventricular hypertrophy.  Left ventricular diastolic parameters  were normal.   2. Right ventricular systolic function is normal. The right ventricular  size is normal.   3. Mild mitral valve regurgitation.   4. 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 No  significant change from report of April 2021. . The aortic valve has been  repaired/replaced. Aortic valve  regurgitation is not visualized.      Nuclear stress test 09/06/20:  The left ventricular ejection fraction is moderately decreased (30-44%). Nuclear stress EF: 42%. There was no ST segment deviation noted during stress. Defect 1: There is a medium defect of moderate severity present in the basal inferior, mid inferior and apex location. Defect 2: There is a small defect of moderate severity present in the apical inferior and apical lateral location. Findings consistent with ischemia. This is an intermediate risk study. Abnormal study. TID is 1.21, cannot exclude balanced ischemia. There is a fixed defect in the inferior wall and apex that is moderately severe, with  mild-moderate hypokinesis in this area. There is a separate small reversible defect of moderate severity in the apical and inferior regions suggestive of ischemia. EF is moderately decreased, though would consider echo for definitive evaluation of function and wall motion. - Per 09/11/20 note by Dr. Peter Jordan, 80% mid nondominant RCA and a 50% distal LAD lesion on previous cardiac catheterization in 2018.  Abnormal myoview  with some inferior and apical ischemia. This was an intermediate risk study. Ordinarily would consider repeat coronary angiography but his bleeding risk is high with ITP and recurrent major lower GI bleeds. He is a poor candidate for PCI because of this. I would not recommend invasive evaluation unless he were to have progressive angina despite medical therapy.     Long term monitor 09/02/20-09/06/20: Normal sinus rhythm. no significant bradycardia or pauses One 5 beat run PAT. otherwise rare PAC Occasional PVCs     RHC/LHC 04/16/16 (Pre-Bentall/AVR):  Mid RCA lesion, 80 %stenosed. Ost LAD to Prox LAD lesion, 25 %stenosed. Dist LAD lesion, 50 %stenosed. There is mild left ventricular systolic dysfunction. LV end diastolic pressure is normal. The left ventricular ejection fraction is 45-50% by visual estimate. There is no mitral valve regurgitation. There is no aortic valve stenosis. LV end diastolic pressure is normal. 1. 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 Mild LV dysfunction. EF 45%. 3. Ascending thoracic aortic aneurysm. 4. 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 5. Normal Right heart  And  LV filling pressures 6. Normal cardiac output.  Plan: Aortic root grafting and AVR.     Past Medical History:  Diagnosis Date   AAA (abdominal aortic aneurysm)    Thoraic   Anemia    Arthritis    Asymptomatic bilateral carotid artery stenosis 08/2015   1-39%    Cataracts, bilateral    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   Coronary  artery disease    Stents x2   Diverticulosis    Enlarged aorta    Enlarged prostate    slightly   GERD (gastroesophageal reflux disease)    takes Pantoprazole  daily as needed   Glaucoma    uses eye drops daily   Headache    History of colon polyps    benign   History of kidney stones    Hyperlipidemia    no on any meds   Hypertension    takes Amlodipine  and Atenolol  daily   Idiopathic thrombocytopenia (HCC)    OSA on CPAP    Vocal cord nodule    pt. states  it's a growth on vocal cord    Past Surgical History:  Procedure Laterality Date   ABDOMINAL AORTIC ENDOVASCULAR STENT GRAFT N/A 11/28/2021   Procedure: ABDOMINAL AORTIC ENDOVASCULAR STENT GRAFT;  Surgeon: Sheree Penne Bruckner, MD;  Location: [facility] OR;  Service: Vascular;  Laterality: N/A;   ABDOMINAL AORTOGRAM N/A 10/05/2022   Procedure: ABDOMINAL AORTOGRAM;  Surgeon: Sheree Penne Bruckner, MD;  Location: [facility] INVASIVE CV LAB;  Service: Cardiovascular;  Laterality: N/A;   ANEURYSM COILING Right 11/28/2021   Procedure: ANEURYSM COILING RIGHT INTERNAL ILIAC;  Surgeon: Sheree Penne Bruckner, MD;  Location: [facility] OR;  Service: Vascular;  Laterality: Right;   AORTIC ARCH ANGIOGRAPHY N/A 04/16/2016   Procedure: Aortic Arch Angiography;  Surgeon: [name] Jordan, MD;  Location: [facility] INVASIVE CV LAB;  Service: Cardiovascular;  Laterality: N/A;   AORTIC VALVE REPLACEMENT N/A 09/15/2016   Procedure: AORTIC VALVE REPLACEMENT (AVR);  Surgeon: Army Dallas NOVAK, MD;  Location: [facility] OR;  Service: Open Heart Surgery;  Laterality: N/A;  Using 29mm Edwards Perimount Magna Ease Aortic Bioprosthesis Valve   ASCENDING AORTIC ROOT REPLACEMENT N/A 09/15/2016   Procedure: ASCENDING AORTIC ROOT REPLACEMENT;  Surgeon: Army Dallas NOVAK, MD;  Location: [facility] OR;  Service: Open Heart Surgery;  Laterality: N/A;  Using 32mm Gelweave Valsalva Graft   CHOLECYSTECTOMY N/A 12/19/2020   Procedure: LAPAROSCOPIC CHOLECYSTECTOMY WITH POSSIBLE INTRAOPERATIVE  CHOLANGIOGRAM;  Surgeon: Tanda Locus, MD;  Location: [facility] OR;  Service: General;  Laterality: N/A;   COLONOSCOPY     COLONOSCOPY Left 04/16/2019   Procedure: COLONOSCOPY;  Surgeon: Burnette Fallow, MD;  Location: [facility] ENDOSCOPY;  Service: Endoscopy;  Laterality: Left;   COLONOSCOPY N/A 06/01/2020   Procedure: COLONOSCOPY;  Surgeon: Saintclair Jasper, MD;  Location: [facility] ENDOSCOPY;  Service: Gastroenterology;  Laterality: N/A;   COLONOSCOPY WITH ESOPHAGOGASTRODUODENOSCOPY (EGD)     COLONOSCOPY WITH PROPOFOL  N/A 05/14/2021   Procedure: COLONOSCOPY WITH PROPOFOL ;  Surgeon: Saintclair Jasper, MD;  Location[facility];  Service: Gastroenterology;  Laterality: N/A;   ERCP N/A 08/19/2021   Procedure: ENDOSCOPIC RETROGRADE CHOLANGIOPANCREATOGRAPHY (ERCP);  Surgeon: Rosalie Kitchens, MD;  Location: [facility];  Service: Gastroenterology;  Laterality: N/A;  at 1:30 pm   ESOPHAGOGASTRODUODENOSCOPY N/A 05/25/2020   Procedure: ESOPHAGOGASTRODUODENOSCOPY (EGD);  Surgeon: Dianna Specking, MD;  Location: [facility] ENDOSCOPY;  Service: Endoscopy;  Laterality: N/A;   ESOPHAGOGASTRODUODENOSCOPY N/A 08/19/2021   Procedure: ESOPHAGOGASTRODUODENOSCOPY (EGD);  Surgeon: Elicia Claw, MD;  Location: [facility];  Service:  Gastroenterology;  Laterality: N/A;   ESOPHAGOGASTRODUODENOSCOPY (EGD) WITH PROPOFOL  Left 04/16/2019   Procedure: ESOPHAGOGASTRODUODENOSCOPY (EGD) WITH PROPOFOL ;  Surgeon: Burnette Fallow, MD;  Location: [facility] ENDOSCOPY;  Service: Endoscopy;  Laterality: Left;   ESOPHAGOGASTRODUODENOSCOPY (EGD) WITH PROPOFOL  N/A 08/23/2021   Procedure: ESOPHAGOGASTRODUODENOSCOPY (EGD) WITH PROPOFOL ;  Surgeon: Saintclair Jasper, MD;  [facility];  Service: Gastroenterology;  Laterality: N/A;   EYE SURGERY Bilateral    cataract with lens implants   FLEXIBLE SIGMOIDOSCOPY N/A 05/25/2020   Procedure: FLEXIBLE SIGMOIDOSCOPY;  Surgeon: Dianna Specking, MD;  Location[facility] END[facility]COPY;  Service: Endoscopy;  Laterality: N/A;   HEMOSTASIS  CONTROL  08/19/2021   Procedure: HEMOSTASIS CONTROL;  Surgeon: Elicia Claw, MD;  Location[facility];  Service: Gastroenterology;;   IR ANGIOGRAM SELECTIVE EACH ADDITIONAL VESSEL  05/26/2021   IR ANGIOGRAM SELECTIVE EACH ADDITIONAL VESSEL  08/20/2021   IR ANGIOGRAM SELECTIVE EACH ADDITIONAL VESSEL  08/20/2021   IR ANGIOGRAM VISCERAL SELECTIVE  05/26/2021   IR ANGIOGRAM VISCERAL SELECTIVE  08/20/2021   IR EMBO ART  VEN HEMORR LYMPH EXTRAV  INC GUIDE ROADMAPPING  05/26/2021   IR EMBO ART  VEN HEMORR LYMPH EXTRAV  INC GUIDE ROADMAPPING  08/20/2021   IR FLUORO GUIDE CV LINE RIGHT  08/20/2021   IR THORACENTESIS ASP PLEURAL SPACE W/IMG GUIDE  10/23/2016   IR US  GUIDE VASC ACCESS LEFT  05/26/2021   IR US  GUIDE VASC ACCESS RIGHT  05/26/2021   IR US  GUIDE VASC ACCESS RIGHT  08/20/2021   IR US  GUIDE VASC ACCESS RIGHT  08/20/2021   MICROLARYNGOSCOPY Right 09/20/2017   Procedure: MICROLARYNGOSCOPY WITH  EXCISION OF VOCAL CORD LESION;  Surgeon: Karis Clunes, MD;  Location: [facility];  Service: ENT;  Laterality: Right;   MULTIPLE EXTRACTIONS WITH ALVEOLOPLASTY N/A 06/10/2016   Procedure: Extraction of tooth #'s 2,8,13,15, and 29  with alveoloplasty, maxillary right and left buccal exostoses reductions, and gross debridement of remaining teeth.;  Surgeon: Cyndee Tanda FALCON, DDS;  Location: [facility] OR;  Service: Oral Surgery;  Laterality: N/A;   POLYPECTOMY  05/14/2021   Procedure: POLYPECTOMY;  Surgeon: Saintclair Jasper, MD;  Location[facility];  Service: Gastroenterology;;   REMOVAL OF STONES  08/19/2021   Procedure: REMOVAL OF STONES;  Surgeon: Rosalie Kitchens, MD;  Location[facility];  Service: Gastroenterology;;   RIGHT/LEFT HEART CATH AND CORONARY ANGIOGRAPHY N/A 04/16/2016   Procedure: Right/Left Heart Cath and Coronary Angiography;  Surgeon: [name] Jordan, MD;  Location: [facility] INVASIVE CV LAB;  Service: Cardiovascular;  Laterality: N/A;   SPHINCTEROTOMY  08/19/2021   Procedure:  SPHINCTEROTOMY;  Surgeon: Rosalie Kitchens, MD;  Location[facility];  Service: Gastroenterology;;   TEE WITHOUT CARDIOVERSION N/A 09/15/2016   Procedure: TRANSESOPHAGEAL ECHOCARDIOGRAM (TEE);  Surgeon: Army Dallas NOVAK, MD;  Location: [facility] OR;  Service: Open Heart Surgery;  Laterality: N/A;    MEDICATIONS:  amLODipine  (NORVASC ) 10 MG tablet   atenolol  (TENORMIN ) 25 MG tablet   fluticasone  (FLONASE ) 50 MCG/ACT nasal spray   hydrocortisone  2.5 % cream   hydrocortisone  cream 1 %   latanoprost  (XALATAN ) 0.005 % ophthalmic solution   losartan -hydrochlorothiazide  (HYZAAR) 100-12.5 MG tablet   Multiple Vitamin (MULTIVITAMIN WITH MINERALS) TABS tablet   NON FORMULARY   Nutritional Supplements (ENSURE PLUS PO)   pantoprazole  (PROTONIX ) 40 MG tablet   rosuvastatin  (CRESTOR ) 20 MG tablet   tamsulosin (FLOMAX) 0.4 MG CAPS capsule   No current facility-administered medications for this encounter.   Burnard CHRISTELLA Senna, PA-C [facility] Surgical Short Stay/Anesthesiology [facility] Phone (  [phone] 02/02/2024 10:07 AM       "

## 2024-01-31 ENCOUNTER — Inpatient Hospital Stay: Attending: Hematology & Oncology

## 2024-01-31 ENCOUNTER — Inpatient Hospital Stay

## 2024-01-31 DIAGNOSIS — I4891 Unspecified atrial fibrillation: Secondary | ICD-10-CM | POA: Diagnosis not present

## 2024-01-31 DIAGNOSIS — Z79899 Other long term (current) drug therapy: Secondary | ICD-10-CM | POA: Insufficient documentation

## 2024-01-31 DIAGNOSIS — Z96641 Presence of right artificial hip joint: Secondary | ICD-10-CM | POA: Insufficient documentation

## 2024-01-31 DIAGNOSIS — D509 Iron deficiency anemia, unspecified: Secondary | ICD-10-CM

## 2024-01-31 DIAGNOSIS — D693 Immune thrombocytopenic purpura: Secondary | ICD-10-CM | POA: Diagnosis present

## 2024-01-31 LAB — CMP (CANCER CENTER ONLY)
ALT: 13 U/L (ref 0–44)
AST: 26 U/L (ref 15–41)
Albumin: 4.8 g/dL (ref 3.5–5.0)
Alkaline Phosphatase: 51 U/L (ref 38–126)
Anion gap: 13 (ref 5–15)
BUN: 27 mg/dL — ABNORMAL HIGH (ref 8–23)
CO2: 24 mmol/L (ref 22–32)
Calcium: 9.8 mg/dL (ref 8.9–10.3)
Chloride: 101 mmol/L (ref 98–111)
Creatinine: 1.44 mg/dL — ABNORMAL HIGH (ref 0.61–1.24)
GFR, Estimated: 51 mL/min — ABNORMAL LOW
Glucose, Bld: 105 mg/dL — ABNORMAL HIGH (ref 70–99)
Potassium: 4.1 mmol/L (ref 3.5–5.1)
Sodium: 138 mmol/L (ref 135–145)
Total Bilirubin: 0.6 mg/dL (ref 0.0–1.2)
Total Protein: 7.5 g/dL (ref 6.5–8.1)

## 2024-01-31 LAB — CBC WITH DIFFERENTIAL (CANCER CENTER ONLY)
Abs Immature Granulocytes: 0.05 K/uL (ref 0.00–0.07)
Basophils Absolute: 0 K/uL (ref 0.0–0.1)
Basophils Relative: 0 %
Eosinophils Absolute: 0.1 K/uL (ref 0.0–0.5)
Eosinophils Relative: 1 %
HCT: 34.4 % — ABNORMAL LOW (ref 39.0–52.0)
Hemoglobin: 11.8 g/dL — ABNORMAL LOW (ref 13.0–17.0)
Immature Granulocytes: 1 %
Lymphocytes Relative: 10 %
Lymphs Abs: 0.8 K/uL (ref 0.7–4.0)
MCH: 32 pg (ref 26.0–34.0)
MCHC: 34.3 g/dL (ref 30.0–36.0)
MCV: 93.2 fL (ref 80.0–100.0)
Monocytes Absolute: 0.7 K/uL (ref 0.1–1.0)
Monocytes Relative: 8 %
Neutro Abs: 6.5 K/uL (ref 1.7–7.7)
Neutrophils Relative %: 80 %
Platelet Count: 120 K/uL — ABNORMAL LOW (ref 150–400)
RBC: 3.69 MIL/uL — ABNORMAL LOW (ref 4.22–5.81)
RDW: 13.5 % (ref 11.5–15.5)
WBC Count: 8.1 K/uL (ref 4.0–10.5)
nRBC: 0 % (ref 0.0–0.2)

## 2024-01-31 NOTE — Progress Notes (Signed)
 No Nplate  given, platelets are 120, 000. Results given to pt.

## 2024-01-31 NOTE — Patient Instructions (Signed)
 [facility] - A DEPT OF . [facility]  Discharge Instructions: Thank you for choosing [facility] to provide your oncology and hematology care.   If you have a lab appointment with the Cancer Center, please go directly to the Cancer Center and check in at the registration area.  Wear comfortable clothing and clothing appropriate for easy access to any Portacath or PICC line.   We strive to give you quality time with your provider. You may need to reschedule your appointment if you arrive late (15 or more minutes).  Arriving late affects you and other patients whose appointments are after yours.  Also, if you miss three or more appointments without notifying the office, you may be dismissed from the clinic at the provider's discretion.      For prescription refill requests, have your pharmacy contact our office and allow 72 hours for refills to be completed.      To help prevent nausea and vomiting after your treatment, we encourage you to take your nausea medication as directed.  BELOW ARE SYMPTOMS THAT SHOULD BE REPORTED IMMEDIATELY: *FEVER GREATER THAN 100.4 F (38 C) OR HIGHER *CHILLS OR SWEATING *NAUSEA AND VOMITING THAT IS NOT CONTROLLED WITH YOUR NAUSEA MEDICATION *UNUSUAL SHORTNESS OF BREATH *UNUSUAL BRUISING OR BLEEDING *URINARY PROBLEMS (pain or burning when urinating, or frequent urination) *BOWEL PROBLEMS (unusual diarrhea, constipation, pain near the anus) TENDERNESS IN MOUTH AND THROAT WITH OR WITHOUT PRESENCE OF ULCERS (sore throat, sores in mouth, or a toothache) UNUSUAL RASH, SWELLING OR PAIN  UNUSUAL VAGINAL DISCHARGE OR ITCHING   Items with * indicate a potential emergency and should be followed up as soon as possible or go to the Emergency Department if any problems should occur.  Please show the CHEMOTHERAPY ALERT CARD or IMMUNOTHERAPY ALERT CARD at check-in to the Emergency Department and triage nurse. Should you have  questions after your visit or need to cancel or reschedule your appointment, please contact [facility] - A DEPT OF [facility]  [phone] and follow the prompts.  Office hours are 8:00 a.m. to 4:30 p.m. Monday - Friday. Please note that voicemails left after 4:00 p.m. may not be returned until the following business day.  We are closed weekends and major holidays. You have access to a nurse at all times for urgent questions. Please call the main number to the clinic [phone] and follow the prompts.  For any non-urgent questions, you may also contact your provider using MyChart. We now offer e-Visits for anyone 59 and older to request care online for non-urgent symptoms. For details visit [email or web address].   Also download the MyChart app! Go to the app store, search MyChart, open the app, select , and log in with your MyChart username and password.

## 2024-02-02 ENCOUNTER — Ambulatory Visit (INDEPENDENT_AMBULATORY_CARE_PROVIDER_SITE_OTHER)

## 2024-02-02 ENCOUNTER — Ambulatory Visit: Admitting: Surgery

## 2024-02-02 DIAGNOSIS — I7121 Aneurysm of the ascending aorta, without rupture: Secondary | ICD-10-CM | POA: Insufficient documentation

## 2024-02-02 NOTE — Anesthesia Preprocedure Evaluation (Addendum)
"                                    Anesthesia Evaluation  Patient identified by MRN, date of birth, ID band Patient awake    Reviewed: Allergy & Precautions, NPO status , Patient's Chart, lab work & pertinent test results, reviewed documented beta blocker date and time   History of Anesthesia Complications Negative for: history of anesthetic complications  Airway Mallampati: II  TM Distance: >3 FB Neck ROM: Full    Dental  (+) Missing   Pulmonary sleep apnea and Continuous Positive Airway Pressure Ventilation , former smoker   Pulmonary exam normal        Cardiovascular hypertension, Pt. on medications and Pt. 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 Left ventricular ejection fraction, by estimation, is 65 to 70%. The left ventricle has normal function. The left ventricle has no regional wall motion abnormalities. There is mild left ventricular hypertrophy. Left ventricular diastolic parameters were normal.   2. Right ventricular systolic function is normal. The right ventricular size is normal.   3. Mild mitral valve regurgitation.   4. S/P AVR with 29 mm MagnaEase bioprosthesis (09/15/16) Peak and mean gradients throuugh the valve aer 22 and 11 mm Hg respectively. No significant change from report of April 2021    Neuro/Psych  Headaches    GI/Hepatic Neg liver ROS,GERD  Medicated and Controlled,,  Endo/Other  negative endocrine ROS    Renal/GU negative Renal ROS     Musculoskeletal  (+) Arthritis ,    Abdominal   Peds  Hematology  (+) anemia   Anesthesia Other Findings   Reproductive/Obstetrics                              Anesthesia Physical Anesthesia Plan  ASA: 3  Anesthesia Plan: Spinal   Post-op  Pain Management: Tylenol  PO (pre-op)*   Induction:   PONV Risk Score and Plan: 2 and Treatment may vary due to age or medical condition, Ondansetron , Propofol  infusion and Dexamethasone   Airway Management Planned: Natural Airway and Simple Face Mask  Additional Equipment:   Intra-op Plan:   Post-operative Plan:   Informed Consent: I have reviewed the patients History and Physical, chart, labs and discussed the procedure including the risks, benefits and alternatives for the proposed anesthesia with the patient or authorized representative who has indicated his/her understanding and acceptance.     Dental advisory given  Plan Discussed with: CRNA  Anesthesia Plan Comments: (See PAT note from 12/30)         Anesthesia Quick Evaluation  "

## 2024-02-02 NOTE — Patient Instructions (Signed)
 Risk Modification in those with ascending thoracic aortic aneurysm:   Continue control of blood pressure (prefer BP 130/80 or less)   2. Avoid fluoroquinolone antibiotics (I.e Ciprofloxacin, Avelox, Levofloxacin, Ofloxacin)   3.  Use of statin (to decrease cardiovascular risk)   4.  Exercise and activity limitations is individualized, but in general, contact sports are to be avoided and one should avoid heavy lifting (defined as half of ideal body weight) and exercises involving sustained Valsalva maneuver.   5.  Follow-up in 6 months with CTA chest.

## 2024-02-02 NOTE — Progress Notes (Signed)
 "     [address] [place] [address]             [phone]       CARDIOTHORACIC SURGERY TELEPHONE VIRTUAL OFFICE NOTE  Referring Provider is Court Dorn PARAS, MD Primary Cardiologist is Peter Jordan, MD PCP is Yolande Toribio MATSU, MD   HPI:  I spoke with Lonnie Hansen (DOB 1950/11/14 ) via telephone on 02/02/2024 at 9:15 AM and verified that I was speaking with the correct person using more than one form of identification.  We discussed the fact that I was contacting them from my office in [place] and they were located at home, in [place] as well as the reason(s) for conducting our visit virtually instead of in-person.  The patient expressed understanding the circumstances and agreed to proceed as described.   Lonnie Hansen is a 74 year old man with medical history of hypertension, bilateral carotid artery stenosis, CAD, OSA, GERD, chronic idiopathic thrombocytopenia, and hyperlipidemia.  He underwent biological Bentall procedure by Dr. 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 He has been followed since for aortic surveillance. He was last seen in office by Dr. Lucas on 07/21/2023. Recent CTA of chest showed the residual aorta beyond the repair measured 5.2 cm.   He reports that he has good blood pressure control with current medications. 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 He is scheduled to have his right hip replaced this Friday.     Current Outpatient Medications  Medication Sig Dispense Refill   amLODipine  (NORVASC ) 10 MG tablet Take 10 mg by mouth daily.     atenolol  (TENORMIN ) 25 MG tablet Take 12.5 mg by mouth 2 (two) times daily. 180 tablet 3   fluticasone  (FLONASE ) 50 MCG/ACT nasal  spray Place 1 spray into both nostrils daily as needed (congestion).     hydrocortisone  2.5 % cream Apply 1 Application topically daily as needed (itching).     hydrocortisone  cream 1 % Apply topically 2 (two) times daily. (Patient not taking: Reported on 01/18/2024) 30 g 0   latanoprost  (XALATAN ) 0.005 % ophthalmic solution Place 1 drop into both eyes in the morning and at bedtime.     losartan -hydrochlorothiazide  (HYZAAR) 100-12.5 MG tablet Take 0.5 tablets by mouth daily.     Multiple Vitamin (MULTIVITAMIN WITH MINERALS) TABS tablet Take 1 tablet by mouth daily.     NON FORMULARY Pt uses a cpap nightly     Nutritional Supplements (ENSURE PLUS PO) Take 1 Bottle by mouth 3 (three) times daily with meals.     pantoprazole  (PROTONIX ) 40 MG tablet Take 40 mg by mouth 2 (two) times daily.     rosuvastatin  (CRESTOR ) 20 MG tablet Take 20 mg by mouth daily.     tamsulosin (FLOMAX) 0.4 MG CAPS capsule Take 0.4 mg by mouth at bedtime.     No current facility-administered medications for this visit.     Diagnostic Tests: CLINICAL DATA:  Thoracic aortic aneurysm surveillance.   EXAM: CT ANGIOGRAPHY CHEST WITH CONTRAST   TECHNIQUE: Multidetector CT imaging of the chest was performed using the standard protocol during bolus administration of intravenous contrast. Multiplanar CT image reconstructions and MIPs  were obtained to evaluate the vascular anatomy.   RADIATION DOSE REDUCTION: This exam was performed according to the departmental dose-optimization program which includes automated exposure control, adjustment of the mA and/or kV according to patient size and/or use of iterative reconstruction technique.   CONTRAST:  OMNIPAQUE  IOHEXOL  350 MG/ML SOLN   COMPARISON:  07/14/2023, 12/21/2022, 11/03/2022   FINDINGS: Cardiovascular: Median sternotomy wires are present. Heart is normal size. Calcified plaque over the left main, left anterior descending and lateral circumflex coronary  arteries.   Continued evidence of aneurysm of the ascending thoracic aorta measuring 5.2 cm without significant change. 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 Aortic root at the sinuses of Valsalva measures 4.4 cm and sinotubular junction measures 3.2 cm. Aortic valve in place. Great vessel takeoff from the aortic arch unchanged. Proximal aortic arch 4.9 cm. Posterior arch 3.9 cm. Descending thoracic aorta 3.4 cm.   Pulmonary arterial system and remaining vascular structures are unremarkable.   Mediastinum/Nodes: No mediastinal or hilar adenopathy. Remaining mediastinal structures are normal.   Lungs/Pleura: Lungs are adequately inflated without acute airspace consolidation or effusion. Mild opacification over the lingula and adjacent left lower lobe which may be due to atelectasis or infection. Airways are normal.   Upper Abdomen: Previous cholecystectomy. Subcentimeter liver hypodensities unchanged and likely cysts or hemangiomas. Cystic change of the kidneys which is stable. Moderate calcification over the left upper pole renal cortex unchanged. Partially visualized infrarenal aortic stent graft unchanged. No acute findings.   Musculoskeletal: Unchanged.   Review of the MIP images confirms the above findings.   IMPRESSION: 1. 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 Recommend semi-annual imaging followup by CTA or MRA and referral to cardiothoracic surgery if not already obtained. This recommendation follows 2010 ACCF/AHA/AATS/ACR/ASA/SCA/SCAI/SIR/STS/SVM Guidelines for the Diagnosis and Management of Patients With Thoracic Aortic Disease. Circulation. 2010; 121: Z733-z630. Aortic aneurysm NOS (ICD10-I71.9). 2. Mild opacification over the lingula and adjacent left lower lobe which may be due to atelectasis or infection. 3. Aortic  atherosclerosis. Atherosclerotic coronary artery disease. 4. Stable subcentimeter liver hypodensities likely cysts or hemangiomas. 5. Stable cystic change of the kidneys. 6. Partially visualized infrarenal aortic stent graft unchanged.   Aortic Atherosclerosis (ICD10-I70.0).     Electronically Signed   By: Toribio Agreste M.D.   On: 01/12/2024 10:02    Impression/Plan: Aneurysm of ascending aorta without rupture -The distal ascending aorta has slowly been enlarging over the past years.  It was 4.7 cm 4 years ago and now it is 5.2 cm, which is stable in size when compared scan 6 months ago. His aortic arch is 4.9 cm.  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  This is unchanged.  His descending thoracic aorta is stable at 3.9 cm. -We discussed CTA scan finding and all questions were answered -Discussed importance of good blood pressure control and he should report readings over 140s/90s to his PCP or cardiologist -Discussed the avoidance of contact sports and heavy lifting, and avoiding any activity that requires a sustained valsalva maneuver.  -The patient is aware of signs and symptoms of aortic dissection and when to present to the emergency department   -Follow up in 6 months with CTA of chest for continued surveillance and then virtual visit       I discussed limitations of evaluation and management via telephone.  The  patient was advised to call back for repeat telephone consultation or to seek an in-person evaluation if questions arise or the patient's clinical condition changes in any significant manner.  I spent in excess of 30 minutes of non-face-to-face time during the conduct of this telephone virtual office consultation, including pre-visit review of the patient's records and direct conversation with the patient.   Level 1  (99441)             5-10 minutes Level 2  (99442)            11-20 minutes Level 3   (99443)            21-30 minutes   Manuelita CHRISTELLA Rough, PA-C 02/02/2024 9:15 AM  "

## 2024-02-02 NOTE — Care Plan (Signed)
 Ortho Bundle Case Management Note  Patient Details  Name: Lonnie Hansen MRN: [id number] Date of Birth: 1950-05-01  [facility] RNCM call to patient to discuss his upcoming Right total hip arthroplasty with Dr. Vernetta at [facility] on 02/04/24. He is agreeable to case management. He plans to return home with assistance from his spouse after discharge. He does have a cane and RW. Anticipate HHPT will be needed after a short hospital stay. Referral made to [facility] after choice provided. Reviewed post op care instructions and questions answered. Will continue to follow for needs.                   DME Arranged:   (Patient has RW and cane) DME Agency:     HH Arranged:  PT HH Agency:  [facility]  Additional Comments: Please contact me with any questions of if this plan should need to change.  Tylene Ned, RN, BSN, [employer]  [phone] 02/02/2024, 4:30 PM

## 2024-02-03 ENCOUNTER — Encounter (HOSPITAL_COMMUNITY): Payer: Self-pay | Admitting: Orthopaedic Surgery

## 2024-02-03 NOTE — H&P (Signed)
 TOTAL HIP ADMISSION H&P  Patient is admitted for right total hip arthroplasty.  Subjective:  Chief Complaint: right hip pain  HPI: Lonnie Hansen, 74 y.o. male, has a history of pain and functional disability in the right hip(s) due to arthritis and patient has failed non-surgical conservative treatments for greater than 12 weeks to include corticosteriod injections, flexibility and strengthening excercises, supervised PT with diminished ADL's post treatment, use of assistive devices, weight reduction as appropriate, and activity modification.  Onset of symptoms was gradual starting several years ago with gradually worsening course since that time.The patient noted no past surgery on the right hip(s).  Patient currently rates pain in the right hip at 10 out of 10 with activity. Patient has night pain, worsening of pain with activity and weight bearing, trendelenberg gait, pain that interfers with activities of daily living, and pain with passive range of motion. Patient has evidence of subchondral cysts, subchondral sclerosis, periarticular osteophytes, and joint space narrowing by imaging studies. This condition presents safety issues increasing the risk of falls.  There is no current active infection.  Patient Active Problem List   Diagnosis Date Noted   Groin injury 10/05/2022   Intra-abdominal bleeding 10/05/2022   AAA (abdominal aortic aneurysm) 11/28/2021   Acute blood loss anemia (ABLA) 08/21/2021   Hyperkalemia 08/21/2021   Acute cholangitis (HCC) 08/21/2021   Choledocholithiasis 08/17/2021   Elevated LFTs 08/16/2021   GI bleed 05/26/2021   Diverticulosis of intestine with bleeding 05/10/2021   Cholecystitis 12/18/2020   Hemorrhagic shock (HCC)    AKI (acute kidney injury)    Cardiac arrest (HCC)    GIB (gastrointestinal bleeding) 05/24/2020   Symptomatic ABLA due to lower GI bleed 04/14/2019   Unilateral primary osteoarthritis, 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   Chronic ITP (idiopathic  thrombocytopenia) (HCC) 03/31/2018   OSA on CPAP 03/04/2017   Vocal cord nodule    Nocturia    Joint swelling    Joint pain    Hypertension    History of kidney stones    History of colon polyps    Headache    Glaucoma    GERD    Enlarged prostate    Aneurysm of infrarenal abdominal aorta    Diverticulosis    Coronary artery disease    Cataracts, bilateral    Bruising    Blood dyscrasia    Arthritis    Anemia    Hydropneumothorax 10/29/2016   S/P AVR 09/15/2016   History of CAD 04/29/2016   Aortic insufficiency 04/01/2016   Thoracic aortic aneurysm 09/18/2015   HTN (hypertension) 09/18/2015   Hyperlipidemia 09/18/2015   Asymptomatic bilateral carotid artery stenosis 08/27/2015   Past Medical History:  Diagnosis Date   AAA (abdominal aortic aneurysm)    Thoraic   Anemia    Arthritis    Asymptomatic bilateral carotid artery stenosis 08/2015   1-39%    Cataracts, bilateral    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   Coronary artery disease    Stents x2   Diverticulosis    Enlarged aorta    Enlarged prostate    slightly   GERD (gastroesophageal reflux disease)    takes Pantoprazole  daily as needed   Glaucoma    uses eye drops daily   Headache    History of colon polyps    benign   History of kidney stones    Hyperlipidemia    no on any meds   Hypertension    takes Amlodipine  and Atenolol  daily  Idiopathic thrombocytopenia (HCC)    OSA on CPAP    Vocal cord nodule    pt. states  it's a growth on vocal cord    Past Surgical History:  Procedure Laterality Date   ABDOMINAL AORTIC ENDOVASCULAR STENT GRAFT N/A 11/28/2021   Procedure: ABDOMINAL AORTIC ENDOVASCULAR STENT GRAFT;  Surgeon: Sheree Penne Bruckner, MD;  Location: [facility] OR;  Service: Vascular;  Laterality: N/A;   ABDOMINAL AORTOGRAM N/A 10/05/2022   Procedure: ABDOMINAL AORTOGRAM;  Surgeon: Sheree Penne Bruckner, MD;  Location: [facility] INVASIVE CV LAB;  Service: Cardiovascular;   Laterality: N/A;   ANEURYSM COILING Right 11/28/2021   Procedure: ANEURYSM COILING RIGHT INTERNAL ILIAC;  Surgeon: Sheree Penne Bruckner, MD;  Location: [facility] OR;  Service: Vascular;  Laterality: Right;   AORTIC ARCH ANGIOGRAPHY N/A 04/16/2016   Procedure: Aortic Arch Angiography;  Surgeon: [name] Jordan, MD;  Location: [facility] INVASIVE CV LAB;  Service: Cardiovascular;  Laterality: N/A;   AORTIC VALVE REPLACEMENT N/A 09/15/2016   Procedure: AORTIC VALVE REPLACEMENT (AVR);  Surgeon: Army Dallas NOVAK, MD;  Location: [facility] OR;  Service: Open Heart Surgery;  Laterality: N/A;  Using 29mm Edwards Perimount Magna Ease Aortic Bioprosthesis Valve   ASCENDING AORTIC ROOT REPLACEMENT N/A 09/15/2016   Procedure: ASCENDING AORTIC ROOT REPLACEMENT;  Surgeon: Army Dallas NOVAK, MD;  Location: [facility] OR;  Service: Open Heart Surgery;  Laterality: N/A;  Using 32mm Gelweave Valsalva Graft   CHOLECYSTECTOMY N/A 12/19/2020   Procedure: LAPAROSCOPIC CHOLECYSTECTOMY WITH POSSIBLE INTRAOPERATIVE CHOLANGIOGRAM;  Surgeon: Tanda Locus, MD;  Location: [facility] OR;  Service: General;  Laterality: N/A;   COLONOSCOPY     COLONOSCOPY Left 04/16/2019   Procedure: COLONOSCOPY;  Surgeon: Burnette Fallow, MD;  Location: [facility] ENDOSCOPY;  Service: Endoscopy;  Laterality: Left;   COLONOSCOPY N/A 06/01/2020   Procedure: COLONOSCOPY;  Surgeon: Saintclair Jasper, MD;  Location: [facility] ENDOSCOPY;  Service: Gastroenterology;  Laterality: N/A;   COLONOSCOPY WITH ESOPHAGOGASTRODUODENOSCOPY (EGD)     COLONOSCOPY WITH PROPOFOL  N/A 05/14/2021   Procedure: COLONOSCOPY WITH PROPOFOL ;  Surgeon: Saintclair Jasper, MD;  Location: [facility] ENDOSCOPY;  Service: Gastroenterology;  Laterality: N/A;   ERCP N/A 08/19/2021   Procedure: ENDOSCOPIC RETROGRADE CHOLANGIOPANCREATOGRAPHY (ERCP);  Surgeon: Rosalie Kitchens, MD;  Location: [facility];  Service: Gastroenterology;  Laterality: N/A;  at 1:30 pm   ESOPHAGOGASTRODUODENOSCOPY N/A 05/25/2020   Procedure: ESOPHAGOGASTRODUODENOSCOPY (EGD);   Surgeon: Dianna Specking, MD;  Location: [facility] ENDOSCOPY;  Service: Endoscopy;  Laterality: N/A;   ESOPHAGOGASTRODUODENOSCOPY N/A 08/19/2021   Procedure: ESOPHAGOGASTRODUODENOSCOPY (EGD);  Surgeon: Elicia Claw, MD;  Location: [facility];  Service: Gastroenterology;  Laterality: N/A;   ESOPHAGOGASTRODUODENOSCOPY (EGD) WITH PROPOFOL  Left 04/16/2019   Procedure: ESOPHAGOGASTRODUODENOSCOPY (EGD) WITH PROPOFOL ;  Surgeon: Burnette Fallow, MD;  Location: [facility] ENDOSCOPY;  Service: Endoscopy;  Laterality: Left;   ESOPHAGOGASTRODUODENOSCOPY (EGD) WITH PROPOFOL  N/A 08/23/2021   Procedure: ESOPHAGOGASTRODUODENOSCOPY (EGD) WITH PROPOFOL ;  Surgeon: Saintclair Jasper, MD;  Location: [facility] ENDOSCOPY;  Service: Gastroenterology;  Laterality: N/A;   EYE SURGERY Bilateral    cataract with lens implants   FLEXIBLE SIGMOIDOSCOPY N/A 05/25/2020   Procedure: FLEXIBLE SIGMOIDOSCOPY;  Surgeon: Dianna Specking, MD;  Location: [facility] ENDOSCOPY;  Service: Endoscopy;  Laterality: N/A;   HEMOSTASIS CONTROL  08/19/2021   Procedure: HEMOSTASIS CONTROL;  Surgeon: Elicia Claw, MD;  Location: [facility] ENDOSCOPY;  Service: Gastroenterology;;   IR ANGIOGRAM SELECTIVE EACH ADDITIONAL VESSEL  05/26/2021   IR ANGIOGRAM SELECTIVE EACH ADDITIONAL VESSEL  08/20/2021   IR ANGIOGRAM SELECTIVE EACH ADDITIONAL VESSEL  08/20/2021   IR ANGIOGRAM VISCERAL SELECTIVE  05/26/2021   IR ANGIOGRAM VISCERAL SELECTIVE  08/20/2021   IR EMBO ART  VEN HEMORR LYMPH EXTRAV  INC GUIDE ROADMAPPING  05/26/2021   IR EMBO ART  VEN HEMORR LYMPH EXTRAV  INC GUIDE ROADMAPPING  08/20/2021   IR FLUORO GUIDE CV LINE RIGHT  08/20/2021   IR THORACENTESIS RIGHT ASP PLEURAL SPACE W/IMG GUIDE  10/23/2016   IR US  GUIDE VASC ACCESS LEFT  05/26/2021   IR US  GUIDE VASC ACCESS RIGHT  05/26/2021   IR US  GUIDE VASC ACCESS RIGHT  08/20/2021   IR US  GUIDE VASC ACCESS RIGHT  08/20/2021   MICROLARYNGOSCOPY Right 09/20/2017   Procedure: MICROLARYNGOSCOPY WITH  EXCISION OF VOCAL CORD  LESION;  Surgeon: Karis Clunes, MD;  Location: [facility];  Service: ENT;  Laterality: Right;   MULTIPLE EXTRACTIONS WITH ALVEOLOPLASTY N/A 06/10/2016   Procedure: Extraction of tooth #'s 2,8,13,15, and 29  with alveoloplasty, maxillary right and left buccal exostoses reductions, and gross debridement of remaining teeth.;  Surgeon: Cyndee Tanda FALCON, DDS;  Location: [facility] OR;  Service: Oral Surgery;  Laterality: N/A;   POLYPECTOMY  05/14/2021   Procedure: POLYPECTOMY;  Surgeon: Saintclair Jasper, MD;  Location: [facility] ENDOSCOPY;  Service: Gastroenterology;;   REMOVAL OF STONES  08/19/2021   Procedure: REMOVAL OF STONES;  Surgeon: Rosalie Kitchens, MD;  Location: [facility] ENDOSCOPY;  Service: Gastroenterology;;   RIGHT/LEFT HEART CATH AND CORONARY ANGIOGRAPHY N/A 04/16/2016   Procedure: Right/Left Heart Cath and Coronary Angiography;  Surgeon: [name] Jordan, MD;  Location: [facility] INVASIVE CV LAB;  Service: Cardiovascular;  Laterality: N/A;   SPHINCTEROTOMY  08/19/2021   Procedure: SPHINCTEROTOMY;  Surgeon: Rosalie Kitchens, MD;  Location: [facility] ENDOSCOPY;  Service: Gastroenterology;;   TEE WITHOUT CARDIOVERSION N/A 09/15/2016   Procedure: TRANSESOPHAGEAL ECHOCARDIOGRAM (TEE);  Surgeon: Army Dallas NOVAK, MD;  Location: [facility] OR;  Service: Open Heart Surgery;  Laterality: N/A;    No current facility-administered medications for this encounter.   Current Outpatient Medications  Medication Sig Dispense Refill Last Dose/Taking   amLODipine  (NORVASC ) 10 MG tablet Take 10 mg by mouth daily.   Taking   atenolol  (TENORMIN ) 25 MG tablet Take 12.5 mg by mouth 2 (two) times daily. 180 tablet 3 Taking   fluticasone  (FLONASE ) 50 MCG/ACT nasal spray Place 1 spray into both nostrils daily as needed (congestion).   Taking As Needed   hydrocortisone  2.5 % cream Apply 1 Application topically daily as needed (itching).   Taking As Needed   latanoprost  (XALATAN ) 0.005 % ophthalmic solution Place 1 drop into both eyes in the morning and at  bedtime.   Taking   losartan -hydrochlorothiazide  (HYZAAR) 100-12.5 MG tablet Take 0.5 tablets by mouth daily.   Taking   Multiple Vitamin (MULTIVITAMIN WITH MINERALS) TABS tablet Take 1 tablet by mouth daily.   Taking   NON FORMULARY Pt uses a cpap nightly   Taking   Nutritional Supplements (ENSURE PLUS PO) Take 1 Bottle by mouth 3 (three) times daily with meals.   Taking   pantoprazole  (PROTONIX ) 40 MG tablet Take 40 mg by mouth 2 (two) times daily.   Taking   rosuvastatin  (CRESTOR ) 20 MG tablet Take 20 mg by mouth daily.   Taking   tamsulosin  (FLOMAX ) 0.4 MG CAPS capsule Take 0.4 mg by mouth at bedtime.   Taking   hydrocortisone  cream 1 % Apply topically 2 (two) times daily. (Patient not taking: Reported on 01/18/2024) 30 g 0 Not Taking   Allergies[1]  Social History   Tobacco Use  Smoking status: Former    Current packs/day: 1.00    Average packs/day: 1 pack/day for 25.0 years (25.0 ttl pk-yrs)    Types: Cigarettes   Smokeless tobacco: Never   Tobacco comments:    Quit 2005  Substance Use Topics   Alcohol  use: No    Alcohol /week: 0.0 standard drinks of alcohol     Family History  Problem Relation Age of Onset   Lymphoma Mother    Heart disease Father    Heart attack Father    Thyroid  disease Sister      Review of Systems  Objective:  Physical Exam Vitals reviewed.  Constitutional:      Appearance: Normal appearance.  HENT:     Head: Normocephalic and atraumatic.  Eyes:     Extraocular Movements: Extraocular movements intact.     Pupils: Pupils are equal, round, and reactive to light.  Cardiovascular:     Rate and Rhythm: Normal rate.  Pulmonary:     Effort: Pulmonary effort is normal.     Breath sounds: Normal breath sounds.  Abdominal:     Palpations: Abdomen is soft.  Musculoskeletal:     Cervical back: Normal range of motion and neck supple.     Right hip: Tenderness and bony tenderness present. Decreased range of motion. Decreased strength.   Neurological:     Mental Status: He is alert and oriented to person, place, and time.  Psychiatric:        Behavior: Behavior normal.     Vital signs in last 24 hours:    Labs:   Estimated body mass index is 26.73 kg/m as calculated from the following:   Height as of 01/25/24: 5' 9 (1.753 m).   Weight as of 01/25/24: 82.1 kg.   Imaging Review Plain radiographs demonstrate severe degenerative joint disease of the right hip(s). The bone quality appears to be good for age and reported activity level.      Assessment/Plan:  End stage arthritis, right hip(s)  The patient history, physical examination, clinical judgement of the provider and imaging studies are consistent with end stage degenerative joint disease of the right hip(s) and total hip arthroplasty is deemed medically necessary. The treatment options including medical management, injection therapy, arthroscopy and arthroplasty were discussed at length. The risks and benefits of total hip arthroplasty were presented and reviewed. The risks due to aseptic loosening, infection, stiffness, dislocation/subluxation,  thromboembolic complications and other imponderables were discussed.  The patient acknowledged the explanation, agreed to proceed with the plan and consent was signed. Patient is being admitted for inpatient treatment for surgery, pain control, PT, OT, prophylactic antibiotics, VTE prophylaxis, progressive ambulation and ADL's and discharge planning.The patient is planning to be discharged home with home health services       [1] No Known Allergies

## 2024-02-04 ENCOUNTER — Ambulatory Visit (HOSPITAL_COMMUNITY)

## 2024-02-04 ENCOUNTER — Encounter (HOSPITAL_COMMUNITY): Admission: RE | Disposition: A | Payer: Self-pay | Source: Ambulatory Visit | Attending: Orthopaedic Surgery

## 2024-02-04 ENCOUNTER — Observation Stay (HOSPITAL_COMMUNITY)

## 2024-02-04 ENCOUNTER — Ambulatory Visit (HOSPITAL_COMMUNITY): Payer: Self-pay | Admitting: Medical

## 2024-02-04 ENCOUNTER — Encounter (HOSPITAL_COMMUNITY): Payer: Self-pay | Admitting: Orthopaedic Surgery

## 2024-02-04 ENCOUNTER — Other Ambulatory Visit: Payer: Self-pay

## 2024-02-04 ENCOUNTER — Inpatient Hospital Stay (HOSPITAL_COMMUNITY)
Admission: RE | Admit: 2024-02-04 | Discharge: 2024-02-10 | DRG: 470 | Disposition: A | Discharge status: Home / Self Care | Attending: Orthopaedic Surgery | Admitting: Orthopaedic Surgery

## 2024-02-04 ENCOUNTER — Ambulatory Visit (HOSPITAL_COMMUNITY): Admitting: Anesthesiology

## 2024-02-04 DIAGNOSIS — Z87891 Personal history of nicotine dependence: Secondary | ICD-10-CM

## 2024-02-04 DIAGNOSIS — K219 Gastro-esophageal reflux disease without esophagitis: Secondary | ICD-10-CM | POA: Diagnosis present

## 2024-02-04 DIAGNOSIS — D500 Iron deficiency anemia secondary to blood loss (chronic): Secondary | ICD-10-CM

## 2024-02-04 DIAGNOSIS — Z96641 Presence of right artificial hip joint: Secondary | ICD-10-CM

## 2024-02-04 DIAGNOSIS — I712 Thoracic aortic aneurysm, without rupture, unspecified: Secondary | ICD-10-CM | POA: Diagnosis present

## 2024-02-04 DIAGNOSIS — I2510 Atherosclerotic heart disease of native coronary artery without angina pectoris: Secondary | ICD-10-CM | POA: Diagnosis present

## 2024-02-04 DIAGNOSIS — Z955 Presence of coronary angioplasty implant and graft: Secondary | ICD-10-CM

## 2024-02-04 DIAGNOSIS — D631 Anemia in chronic kidney disease: Secondary | ICD-10-CM

## 2024-02-04 DIAGNOSIS — I129 Hypertensive chronic kidney disease with stage 1 through stage 4 chronic kidney disease, or unspecified chronic kidney disease: Secondary | ICD-10-CM | POA: Diagnosis present

## 2024-02-04 DIAGNOSIS — Z8674 Personal history of sudden cardiac arrest: Secondary | ICD-10-CM

## 2024-02-04 DIAGNOSIS — I480 Paroxysmal atrial fibrillation: Secondary | ICD-10-CM | POA: Diagnosis present

## 2024-02-04 DIAGNOSIS — Z79899 Other long term (current) drug therapy: Secondary | ICD-10-CM

## 2024-02-04 DIAGNOSIS — Z952 Presence of prosthetic heart valve: Secondary | ICD-10-CM

## 2024-02-04 DIAGNOSIS — Z860100 Personal history of colon polyps, unspecified: Secondary | ICD-10-CM

## 2024-02-04 DIAGNOSIS — Z8349 Family history of other endocrine, nutritional and metabolic diseases: Secondary | ICD-10-CM

## 2024-02-04 DIAGNOSIS — Z8249 Family history of ischemic heart disease and other diseases of the circulatory system: Secondary | ICD-10-CM

## 2024-02-04 DIAGNOSIS — D759 Disease of blood and blood-forming organs, unspecified: Secondary | ICD-10-CM

## 2024-02-04 DIAGNOSIS — D693 Immune thrombocytopenic purpura: Secondary | ICD-10-CM | POA: Diagnosis present

## 2024-02-04 DIAGNOSIS — F17210 Nicotine dependence, cigarettes, uncomplicated: Secondary | ICD-10-CM | POA: Diagnosis present

## 2024-02-04 DIAGNOSIS — M1611 Unilateral primary osteoarthritis, right hip: Principal | ICD-10-CM | POA: Diagnosis present

## 2024-02-04 DIAGNOSIS — G4733 Obstructive sleep apnea (adult) (pediatric): Secondary | ICD-10-CM | POA: Diagnosis present

## 2024-02-04 DIAGNOSIS — N179 Acute kidney failure, unspecified: Secondary | ICD-10-CM

## 2024-02-04 DIAGNOSIS — Z01818 Encounter for other preprocedural examination: Secondary | ICD-10-CM

## 2024-02-04 DIAGNOSIS — Z96649 Presence of unspecified artificial hip joint: Secondary | ICD-10-CM

## 2024-02-04 DIAGNOSIS — I351 Nonrheumatic aortic (valve) insufficiency: Principal | ICD-10-CM

## 2024-02-04 DIAGNOSIS — Z807 Family history of other malignant neoplasms of lymphoid, hematopoietic and related tissues: Secondary | ICD-10-CM

## 2024-02-04 DIAGNOSIS — I10 Essential (primary) hypertension: Secondary | ICD-10-CM

## 2024-02-04 DIAGNOSIS — N400 Enlarged prostate without lower urinary tract symptoms: Secondary | ICD-10-CM | POA: Diagnosis present

## 2024-02-04 DIAGNOSIS — D62 Acute posthemorrhagic anemia: Secondary | ICD-10-CM | POA: Diagnosis present

## 2024-02-04 DIAGNOSIS — H409 Unspecified glaucoma: Secondary | ICD-10-CM | POA: Diagnosis present

## 2024-02-04 DIAGNOSIS — N1831 Chronic kidney disease, stage 3a: Secondary | ICD-10-CM | POA: Diagnosis present

## 2024-02-04 DIAGNOSIS — E785 Hyperlipidemia, unspecified: Secondary | ICD-10-CM | POA: Diagnosis present

## 2024-02-04 HISTORY — PX: TOTAL HIP ARTHROPLASTY: SHX124

## 2024-02-04 LAB — PREPARE RBC (CROSSMATCH)

## 2024-02-04 LAB — TYPE AND SCREEN
ABO/RH(D): AB POS
Antibody Screen: NEGATIVE
Unit division: 0
Unit division: 0

## 2024-02-04 LAB — CBC
HCT: 24.3 % — ABNORMAL LOW (ref 39.0–52.0)
Hemoglobin: 8.2 g/dL — ABNORMAL LOW (ref 13.0–17.0)
MCH: 32.2 pg (ref 26.0–34.0)
MCHC: 33.7 g/dL (ref 30.0–36.0)
MCV: 95.3 fL (ref 80.0–100.0)
Platelets: 76 K/uL — ABNORMAL LOW (ref 150–400)
RBC: 2.55 MIL/uL — ABNORMAL LOW (ref 4.22–5.81)
RDW: 13.5 % (ref 11.5–15.5)
WBC: 11.9 K/uL — ABNORMAL HIGH (ref 4.0–10.5)
nRBC: 0 % (ref 0.0–0.2)

## 2024-02-04 LAB — BPAM RBC
Blood Product Expiration Date: 202601262359
Blood Product Expiration Date: 202601262359
Unit Type and Rh: 6200
Unit Type and Rh: 6200

## 2024-02-04 MED ORDER — ALUM & MAG HYDROXIDE-SIMETH 200-200-20 MG/5ML PO SUSP: 30.0000 mL | ORAL | Status: DC | PRN

## 2024-02-04 MED ORDER — LOSARTAN POTASSIUM-HCTZ 100-12.5 MG PO TABS: 0.5000 | ORAL_TABLET | Freq: Every day | ORAL | Status: DC

## 2024-02-04 MED ORDER — ASPIRIN 81 MG PO CHEW
81.0000 mg | CHEWABLE_TABLET | Freq: Two times a day (BID) | ORAL | Status: DC
  Administered 2024-02-04 – 2024-02-05 (×3): 81 mg via ORAL
  Filled 2024-02-04 (×3): qty 1

## 2024-02-04 MED ORDER — OXYCODONE HCL 5 MG PO TABS
5.0000 mg | ORAL_TABLET | ORAL | Status: DC | PRN
  Administered 2024-02-04 – 2024-02-06 (×4): 5 mg via ORAL
  Filled 2024-02-04 (×4): qty 1

## 2024-02-04 MED ORDER — ADULT MULTIVITAMIN W/MINERALS CH
1.0000 | ORAL_TABLET | Freq: Every day | ORAL | Status: DC
  Administered 2024-02-04 – 2024-02-10 (×7): 1 via ORAL
  Filled 2024-02-04 (×7): qty 1

## 2024-02-04 MED ORDER — METOCLOPRAMIDE HCL 5 MG PO TABS: 5.0000 mg | ORAL_TABLET | Freq: Three times a day (TID) | ORAL | Status: DC | PRN

## 2024-02-04 MED ORDER — LOSARTAN POTASSIUM 50 MG PO TABS
100.0000 mg | ORAL_TABLET | Freq: Every day | ORAL | Status: DC
  Administered 2024-02-08: 100 mg via ORAL
  Filled 2024-02-04 (×2): qty 2

## 2024-02-04 MED ORDER — PROPOFOL 10 MG/ML IV BOLUS
INTRAVENOUS | Status: AC
  Filled 2024-02-04: qty 20

## 2024-02-04 MED ORDER — ROSUVASTATIN CALCIUM 20 MG PO TABS
20.0000 mg | ORAL_TABLET | Freq: Every day | ORAL | Status: DC
  Administered 2024-02-05 – 2024-02-10 (×6): 20 mg via ORAL
  Filled 2024-02-04 (×6): qty 1

## 2024-02-04 MED ORDER — TAMSULOSIN HCL 0.4 MG PO CAPS
0.4000 mg | ORAL_CAPSULE | Freq: Every day | ORAL | Status: DC
  Administered 2024-02-04 – 2024-02-09 (×6): 0.4 mg via ORAL
  Filled 2024-02-04 (×6): qty 1

## 2024-02-04 MED ORDER — FENTANYL CITRATE (PF) 100 MCG/2ML IJ SOLN
INTRAMUSCULAR | Status: DC | PRN
  Administered 2024-02-04: 25 ug via INTRAVENOUS
  Administered 2024-02-04: 50 ug via INTRAVENOUS
  Administered 2024-02-04: 25 ug via INTRAVENOUS
  Administered 2024-02-04: 50 ug via INTRAVENOUS
  Administered 2024-02-04 (×2): 25 ug via INTRAVENOUS

## 2024-02-04 MED ORDER — ORAL CARE MOUTH RINSE: 15.0000 mL | Freq: Once | OROMUCOSAL | Status: AC

## 2024-02-04 MED ORDER — OXYCODONE HCL 5 MG PO TABS: 10.0000 mg | ORAL_TABLET | ORAL | Status: DC | PRN

## 2024-02-04 MED ORDER — HYDROCHLOROTHIAZIDE 12.5 MG PO TABS
12.5000 mg | ORAL_TABLET | Freq: Every day | ORAL | Status: DC
  Administered 2024-02-08: 12.5 mg via ORAL
  Filled 2024-02-04 (×2): qty 1

## 2024-02-04 MED ORDER — CEFAZOLIN SODIUM-DEXTROSE 2-4 GM/100ML-% IV SOLN
2.0000 g | Freq: Four times a day (QID) | INTRAVENOUS | Status: AC
  Administered 2024-02-04 (×2): 2 g via INTRAVENOUS
  Filled 2024-02-04 (×2): qty 100

## 2024-02-04 MED ORDER — METHOCARBAMOL 1000 MG/10ML IJ SOLN: 500.0000 mg | Freq: Four times a day (QID) | INTRAMUSCULAR | Status: DC | PRN

## 2024-02-04 MED ORDER — LACTATED RINGERS IV SOLN: INTRAVENOUS | Status: DC

## 2024-02-04 MED ORDER — PROPOFOL 1000 MG/100ML IV EMUL
INTRAVENOUS | Status: AC
  Filled 2024-02-04: qty 100

## 2024-02-04 MED ORDER — FENTANYL CITRATE (PF) 50 MCG/ML IJ SOSY
PREFILLED_SYRINGE | INTRAMUSCULAR | Status: AC
  Filled 2024-02-04: qty 1

## 2024-02-04 MED ORDER — TRANEXAMIC ACID-NACL 1000-0.7 MG/100ML-% IV SOLN
1000.0000 mg | INTRAVENOUS | Status: AC
  Administered 2024-02-04: 1000 mg via INTRAVENOUS
  Filled 2024-02-04: qty 100

## 2024-02-04 MED ORDER — AMLODIPINE BESYLATE 10 MG PO TABS
10.0000 mg | ORAL_TABLET | Freq: Every day | ORAL | Status: DC
  Administered 2024-02-08: 10 mg via ORAL
  Filled 2024-02-04 (×2): qty 1

## 2024-02-04 MED ORDER — DROPERIDOL 2.5 MG/ML IJ SOLN
INTRAMUSCULAR | Status: AC
  Filled 2024-02-04: qty 2

## 2024-02-04 MED ORDER — MENTHOL 3 MG MT LOZG: 1.0000 | LOZENGE | OROMUCOSAL | Status: DC | PRN

## 2024-02-04 MED ORDER — CEFAZOLIN SODIUM-DEXTROSE 2-4 GM/100ML-% IV SOLN
2.0000 g | INTRAVENOUS | Status: AC
  Administered 2024-02-04: 2 g via INTRAVENOUS
  Filled 2024-02-04: qty 100

## 2024-02-04 MED ORDER — HYDROMORPHONE HCL 1 MG/ML IJ SOLN: 0.5000 mg | INTRAMUSCULAR | Status: DC | PRN

## 2024-02-04 MED ORDER — LATANOPROST 0.005 % OP SOLN
1.0000 [drp] | Freq: Two times a day (BID) | OPHTHALMIC | Status: DC
  Administered 2024-02-04 – 2024-02-10 (×12): 1 [drp] via OPHTHALMIC
  Filled 2024-02-04 (×2): qty 2.5

## 2024-02-04 MED ORDER — DEXAMETHASONE SOD PHOSPHATE PF 10 MG/ML IJ SOLN
INTRAMUSCULAR | Status: AC
  Filled 2024-02-04: qty 2

## 2024-02-04 MED ORDER — LIDOCAINE HCL (PF) 2 % IJ SOLN
INTRAMUSCULAR | Status: AC
  Filled 2024-02-04: qty 10

## 2024-02-04 MED ORDER — SODIUM CHLORIDE 0.9% IV SOLUTION: Freq: Once | INTRAVENOUS | Status: AC

## 2024-02-04 MED ORDER — STERILE WATER FOR IRRIGATION IR SOLN
Status: DC | PRN
  Administered 2024-02-04: 1000 mL

## 2024-02-04 MED ORDER — ACETAMINOPHEN 325 MG PO TABS
325.0000 mg | ORAL_TABLET | Freq: Four times a day (QID) | ORAL | Status: DC | PRN
  Administered 2024-02-05 – 2024-02-09 (×10): 650 mg via ORAL
  Administered 2024-02-10: 325 mg via ORAL
  Administered 2024-02-10: 650 mg via ORAL
  Filled 2024-02-04 (×12): qty 2

## 2024-02-04 MED ORDER — DROPERIDOL 2.5 MG/ML IJ SOLN
0.6250 mg | Freq: Once | INTRAMUSCULAR | Status: AC | PRN
  Administered 2024-02-04: 0.625 mg via INTRAVENOUS

## 2024-02-04 MED ORDER — ALBUMIN HUMAN 25 % IV SOLN
12.5000 g | Freq: Once | INTRAVENOUS | Status: AC
  Administered 2024-02-04: 12.5 g via INTRAVENOUS

## 2024-02-04 MED ORDER — OXYCODONE HCL 5 MG PO TABS
ORAL_TABLET | ORAL | Status: AC
  Filled 2024-02-04: qty 1

## 2024-02-04 MED ORDER — FENTANYL CITRATE (PF) 100 MCG/2ML IJ SOLN
INTRAMUSCULAR | Status: AC
  Filled 2024-02-04: qty 2

## 2024-02-04 MED ORDER — METHOCARBAMOL 500 MG PO TABS
500.0000 mg | ORAL_TABLET | Freq: Four times a day (QID) | ORAL | Status: DC | PRN
  Administered 2024-02-04: 500 mg via ORAL
  Filled 2024-02-04: qty 1

## 2024-02-04 MED ORDER — SODIUM CHLORIDE 0.9 % IV SOLN: INTRAVENOUS | Status: DC

## 2024-02-04 MED ORDER — GLYCOPYRROLATE 0.2 MG/ML IJ SOLN
INTRAMUSCULAR | Status: DC | PRN
  Administered 2024-02-04: .2 mg via INTRAVENOUS

## 2024-02-04 MED ORDER — ONDANSETRON HCL 4 MG/2ML IJ SOLN
INTRAMUSCULAR | Status: DC | PRN
  Administered 2024-02-04: 4 mg via INTRAVENOUS

## 2024-02-04 MED ORDER — ACETAMINOPHEN 500 MG PO TABS
1000.0000 mg | ORAL_TABLET | Freq: Once | ORAL | Status: AC
  Administered 2024-02-04: 1000 mg via ORAL
  Filled 2024-02-04: qty 2

## 2024-02-04 MED ORDER — EPHEDRINE SULFATE (PRESSORS) 25 MG/5ML IV SOSY
PREFILLED_SYRINGE | INTRAVENOUS | Status: DC | PRN
  Administered 2024-02-04: 10 mg via INTRAVENOUS
  Administered 2024-02-04: 5 mg via INTRAVENOUS
  Administered 2024-02-04: 10 mg via INTRAVENOUS

## 2024-02-04 MED ORDER — SODIUM CHLORIDE 0.9 % IR SOLN
Status: DC | PRN
  Administered 2024-02-04: 1000 mL

## 2024-02-04 MED ORDER — PHENOL 1.4 % MT LIQD: 1.0000 | OROMUCOSAL | Status: DC | PRN

## 2024-02-04 MED ORDER — OXYCODONE HCL 5 MG/5ML PO SOLN: 5.0000 mg | Freq: Once | ORAL | Status: AC | PRN

## 2024-02-04 MED ORDER — PANTOPRAZOLE SODIUM 40 MG PO TBEC
40.0000 mg | DELAYED_RELEASE_TABLET | Freq: Two times a day (BID) | ORAL | Status: DC
  Administered 2024-02-04 – 2024-02-10 (×12): 40 mg via ORAL
  Filled 2024-02-04 (×12): qty 1

## 2024-02-04 MED ORDER — PHENYLEPHRINE HCL-NACL 20-0.9 MG/250ML-% IV SOLN
INTRAVENOUS | Status: DC | PRN
  Administered 2024-02-04: 55 ug/min via INTRAVENOUS

## 2024-02-04 MED ORDER — METOCLOPRAMIDE HCL 5 MG/ML IJ SOLN: 5.0000 mg | Freq: Three times a day (TID) | INTRAMUSCULAR | Status: DC | PRN

## 2024-02-04 MED ORDER — CHLORHEXIDINE GLUCONATE 0.12 % MT SOLN
15.0000 mL | Freq: Once | OROMUCOSAL | Status: AC
  Administered 2024-02-04: 15 mL via OROMUCOSAL

## 2024-02-04 MED ORDER — LIDOCAINE HCL (CARDIAC) PF 100 MG/5ML IV SOSY
PREFILLED_SYRINGE | INTRAVENOUS | Status: DC | PRN
  Administered 2024-02-04 (×2): 50 mg via INTRAVENOUS

## 2024-02-04 MED ORDER — PROPOFOL 10 MG/ML IV BOLUS
INTRAVENOUS | Status: DC | PRN
  Administered 2024-02-04: 180 mg via INTRAVENOUS
  Administered 2024-02-04: 20 mg via INTRAVENOUS

## 2024-02-04 MED ORDER — ALBUMIN HUMAN 25 % IV SOLN
12.5000 g | Freq: Once | INTRAVENOUS | Status: AC
  Administered 2024-02-04: 12.5 g via INTRAVENOUS
  Filled 2024-02-04: qty 50

## 2024-02-04 MED ORDER — ONDANSETRON HCL 4 MG PO TABS: 4.0000 mg | ORAL_TABLET | Freq: Four times a day (QID) | ORAL | Status: DC | PRN

## 2024-02-04 MED ORDER — FENTANYL CITRATE (PF) 50 MCG/ML IJ SOSY
25.0000 ug | PREFILLED_SYRINGE | INTRAMUSCULAR | Status: DC | PRN
  Administered 2024-02-04 (×2): 25 ug via INTRAVENOUS

## 2024-02-04 MED ORDER — OXYCODONE HCL 5 MG PO TABS
5.0000 mg | ORAL_TABLET | Freq: Once | ORAL | Status: AC | PRN
  Administered 2024-02-04: 5 mg via ORAL

## 2024-02-04 MED ORDER — DIPHENHYDRAMINE HCL 12.5 MG/5ML PO ELIX: 12.5000 mg | ORAL_SOLUTION | ORAL | Status: DC | PRN

## 2024-02-04 MED ORDER — SODIUM CHLORIDE 0.9 % IV BOLUS
500.0000 mL | Freq: Once | INTRAVENOUS | Status: AC
  Administered 2024-02-04: 500 mL via INTRAVENOUS

## 2024-02-04 MED ORDER — ATENOLOL 25 MG PO TABS
12.5000 mg | ORAL_TABLET | Freq: Two times a day (BID) | ORAL | Status: DC
  Administered 2024-02-08: 12.5 mg via ORAL
  Filled 2024-02-04 (×3): qty 1

## 2024-02-04 MED ORDER — EPHEDRINE 5 MG/ML INJ
INTRAVENOUS | Status: AC
  Filled 2024-02-04: qty 5

## 2024-02-04 MED ORDER — DEXAMETHASONE SOD PHOSPHATE PF 10 MG/ML IJ SOLN
INTRAMUSCULAR | Status: DC | PRN
  Administered 2024-02-04: 6 mg via INTRAVENOUS

## 2024-02-04 MED ORDER — ALBUMIN HUMAN 5 % IV SOLN
INTRAVENOUS | Status: AC
  Filled 2024-02-04: qty 250

## 2024-02-04 MED ORDER — ONDANSETRON HCL 4 MG/2ML IJ SOLN
INTRAMUSCULAR | Status: AC
  Filled 2024-02-04: qty 4

## 2024-02-04 MED ORDER — 0.9 % SODIUM CHLORIDE (POUR BTL) OPTIME
TOPICAL | Status: DC | PRN
  Administered 2024-02-04: 1000 mL

## 2024-02-04 MED ORDER — DOCUSATE SODIUM 100 MG PO CAPS
100.0000 mg | ORAL_CAPSULE | Freq: Two times a day (BID) | ORAL | Status: DC
  Administered 2024-02-04 – 2024-02-10 (×13): 100 mg via ORAL
  Filled 2024-02-04 (×13): qty 1

## 2024-02-04 MED ORDER — ONDANSETRON HCL 4 MG/2ML IJ SOLN
4.0000 mg | Freq: Four times a day (QID) | INTRAMUSCULAR | Status: DC | PRN
  Administered 2024-02-04: 4 mg via INTRAVENOUS
  Filled 2024-02-04: qty 2

## 2024-02-04 MED ORDER — PROPOFOL 500 MG/50ML IV EMUL: INTRAVENOUS | Status: DC | PRN

## 2024-02-04 NOTE — Op Note (Signed)
 "    Operative Note  Date of operation: 02/04/2024 Preoperative diagnosis: Right hip primary osteoarthritis Postoperative diagnosis: Same  Procedure: Right direct anterior total hip arthroplasty  Implants: Implant Name Type Inv. Item Serial No. Manufacturer Lot No. LRB No. Used Action  CUP ACETAB W/GRIPTION 54 - [id number] Plate CUP ACETAB W/GRIPTION 54  [employer]PU[employer] [id number] Right 1 Implanted  SCREW 6.5MMX25MM - [id number] Screw SCREW 6.5MMX25MM  [employer]PU[employer] [id number] Right 1 Implanted  LINER NEUTRAL 54X36MM PLUS 4 - [id number] Hips LINER NEUTRAL 54X36MM PLUS 4  DEPUY ORTH[employer] [id number] Right 1 Implanted  STEM FEMORAL SZ5 HIGH ACTIS - [id number] Stem STEM FEMORAL SZ5 HIGH ACTIS  DEPUY ORTHOPA[employer]ICS [id number] Right 1 Implanted  BALL HIP ARTICU EZE 36 8.5 - [id number] Hips BALL HIP ARTICU EZE 36 8.5  DEPUY ORTH[employer] [id number] Right 1 Implanted   Surgeon: Lonni GRADE. Vernetta, MD Assistant: Almeda Rummer, PA-C  Anesthesia: #1 attempted spinal, #2 General EBL: 250 cc Antibiotics: IV Ancef  Complications: None  Indications: The patient is a 74 year old gentleman with debilitating arthritis involving his right hip.  We have seen him for a very long period of time.  He does have significant comorbidities including thrombocytopenia.  He is followed by hematology and oncology and does have platelet transfusions.  He has been optimized for surgery and does wish to proceed with surgery given the severity of his right hip arthritis.  His x-rays show bone-on-bone wear.  He has significant daily pain that is detrimentally fighting his mobility, his quality of life and his activities day living.  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  At this point he does wish proceed with a hip replacement.  We did discuss in length in detail the significantly heightened risk of acute blood loss anemia, nerve and vessel injury, fracture, infection, DVT, implant failure, dislocation,  leg length differences and wound healing issues.  He understands that our goals are hopefully decreased pain, improved mobility and improved quality of life.  Procedure description: After informed consent was obtained and the appropriate right hip was marked, the patient is brought the operating room and set up on the stretcher where spinal anesthesia was attempted but unable to be obtained.  He was then laid in a supine position on the stretcher and general anesthesia was obtained.  We assessed his leg length and the significant shorter on the right than the left side.  Traction boots were placed on both his feet and next he was placed supine on the Hana fracture table with a perineal post in place and both legs in inline skeletal traction devices no traction applied.  His right operative hip and pelvis were assessed radiographically.  The right hip was prepped and draped with DuraPrep and sterile drapes.  A timeout was called and he identified as correct patient's correct right hip.  An incision was then made just inferior and posterior to the ASIS and carried slightly obliquely down the leg.  Dissection was carried down to the tensor fascia lata muscle and tensor fascia was divided longitudinally to proceed with a direct and approach the hip.  Circumflex vessels were identified and cauterized.  The hip capsule was identified and opened up in an L-type format finding a moderate joint effusion.  Cobra tractors were placed around the medial and lateral femoral neck and a femoral neck cut was made with an oscillating saw just proximal to the lesser trochanter and this cut was completed with an osteotome.  A corkscrew gauze placed in the femoral head and the femoral head was removed in its entirety and it was significantly deformed and devoid of cartilage.  A bent Hohmann was placed over the medial acetabular rim.  The acetabular labrum and other debris removed including several loose bodies.  Reaming was then  initiated starting with a size 43 reamer and stepwise increments going up to a size 53 reamer with all reamers under direct visualization and the last reamer also under direct fluoroscopy in order to obtain the depth of reaming, the inclination and the anteversion.  The real DePuy sector GRIPTION acetabular clinic size 54 was then placed without difficulty followed by a single dome screw and a 36+4 polythene liner.  Attention was then turned to the femur.  With the right leg externally rotated to 120 degrees, extended and adducted, a Mueller retractors placed medially and a Hohmann tractor by the greater trochanter.  The lateral joint capsule was released and a box cutting osteotome was used to enter femoral canal.  Broaching was then initiated using the Actis broaching system from a size 0 going up to a size 5.  With a size 5 in place we trialed a standard offset femoral neck and a 36+1.5 trial head ball.  This reduced the pelvis and based on clinical and radiographic assessment I felt like we needed more offset and leg length.  We dislocated the hip and remove the trial components.  We then placed the real Actis femoral component with high offset size 5 and with the real 36+8.5 metal head ball.  Again this was reduced in the pelvis and we are pleased with improving his leg length and also pleased with clinical and radiographic assessment in terms of stability and offset.  We did improve his leg length significantly as well.  The soft tissue was then irrigated with normal saline solution.  The joint capsule was closed with interrupted #1 Ethibond suture followed by normal Vicryl close tensor fascia.  0 Vicryl was used to close deep tissue and 2-0 Vicryl was used to close the subcutaneous tissue.  The skin was closed with staples.  An Aquacel dressing is applied.  The patient was awakened, extubated and taken the recovery room.  Almeda Rummer, PA-C did assist during the crucial portions of the case and her  assistance was medically necessary and crucial for soft tissue management and retraction, helping guide implant placement and a layered closure of the wound. "

## 2024-02-04 NOTE — Transfer of Care (Signed)
 Immediate Anesthesia Transfer of Care Note  Patient: Lonnie Hansen  Procedure(s) Performed: Procedures: ARTHROPLASTY, HIP, TOTAL, ANTERIOR APPROACH (Right)  Patient Location: PACU  Anesthesia Type:General  Level of Consciousness:  sedated, patient cooperative and responds to stimulation  Airway & Oxygen  Therapy:Patient Spontanous Breathing and Patient connected to face mask oxgen  Post-op Assessment:  Report given to PACU RN and Post -op Vital signs reviewed and stable  Post vital signs:  Reviewed and stable  Last Vitals:  Vitals:   02/04/24 0623  Temp: 36.8 C    Complications: No apparent anesthesia complications

## 2024-02-04 NOTE — Anesthesia Postprocedure Evaluation (Signed)
"   Anesthesia Post Note  Patient: Lonnie Hansen  Procedure(s) Performed: ARTHROPLASTY, HIP, TOTAL, ANTERIOR APPROACH (Right: Hip)     Patient location during evaluation: PACU Anesthesia Type: General Level of consciousness: awake and alert Pain management: pain level controlled Vital Signs Assessment: post-procedure vital signs reviewed and stable Respiratory status: spontaneous breathing, nonlabored ventilation and respiratory function stable Cardiovascular status: blood pressure returned to baseline Postop Assessment: no apparent nausea or vomiting Anesthetic complications: no   No notable events documented.  Last Vitals:  Vitals:   02/04/24 1000 02/04/24 1015  BP: (!) 87/64 91/73  Pulse: (!) 52 (!) 59  Resp: 15 16  Temp:    SpO2: 99% 100%    Last Pain:  Vitals:   02/04/24 1000  TempSrc:   PainSc: Asleep                 Vertell Row      "

## 2024-02-04 NOTE — Interval H&P Note (Signed)
 History and Physical Interval Note: The patient understands that he is here today for right total hip replacement to treat his significant right hip pain and arthritis.  There has been no acute change in his medical status.  The risks and benefits of surgery have been discussed in detail and informed consent has been obtained.  The right operative hip has been marked.  02/04/2024 6:49 AM  Lonnie Hansen  has presented today for surgery, with the diagnosis of osteoarthritis right hip.  The various methods of treatment have been discussed with the patient and family. After consideration of risks, benefits and other options for treatment, the patient has consented to  Procedures: ARTHROPLASTY, HIP, TOTAL, ANTERIOR APPROACH (Right) as a surgical intervention.  The patient's history has been reviewed, patient examined, no change in status, stable for surgery.  I have reviewed the patient's chart and labs.  Questions were answered to the patient's satisfaction.     [name]

## 2024-02-04 NOTE — Anesthesia Procedure Notes (Signed)
 Procedure Name: LMA Insertion Date/Time: 02/04/2024 7:41 AM  Performed by: Vincenzo Show, CRNAPre-anesthesia Checklist: Patient identified, Emergency Drugs available, Suction available, Patient being monitored and Timeout performed Patient Re-evaluated:Patient Re-evaluated prior to induction Oxygen  Delivery Method: Circle system utilized Preoxygenation: Pre-oxygenation with 100% oxygen  Induction Type: IV induction Ventilation: Mask ventilation without difficulty LMA: LMA inserted and LMA with gastric port inserted LMA Size: 4.0 Tube size: 4.0 mm Number of attempts: 1 Placement Confirmation: positive ETCO2 and breath sounds checked- equal and bilateral Tube secured with: Tape Dental Injury: Teeth and Oropharynx as per pre-operative assessment

## 2024-02-04 NOTE — Addendum Note (Signed)
 Addendum  created 02/04/24 1245 by Paul Lamarr BRAVO, MD   Intraprocedure Meds edited

## 2024-02-04 NOTE — Plan of Care (Signed)
" °  Problem: Education: Goal: Knowledge of the prescribed therapeutic regimen will improve Outcome: Progressing   Problem: Activity: Goal: Risk for activity intolerance will decrease Outcome: Progressing   Problem: Nutrition: Goal: Adequate nutrition will be maintained Outcome: Progressing   Problem: Safety: Goal: Ability to remain free from injury will improve Outcome: Progressing   Problem: Pain Managment: Goal: General experience of comfort will improve and/or be controlled Outcome: Progressing   "

## 2024-02-04 NOTE — Progress Notes (Signed)
" °   02/04/24 1950  BiPAP/CPAP/SIPAP  BiPAP/CPAP/SIPAP Pt Type Adult  Reason BIPAP/CPAP not in use Non-compliant    "

## 2024-02-04 NOTE — Progress Notes (Signed)
 PT Cancellation Note  Patient Details Name: Lonnie Hansen MRN: [id number] DOB: Apr 05, 1950   Cancelled Treatment:     PT order received but eval deferred - pt with low BP, elevated pain level and with transfusion ordered (Hgb 8.1).  Will follow   Dashayla Theissen 02/04/2024, 4:50 PM

## 2024-02-04 NOTE — Progress Notes (Signed)
 Patient ID: Lonnie Hansen, male   DOB: Jun 23, 1950, 74 y.o.   MRN: [id number] Preoperatively, the patient's platelets were 120,000.  Today postop they are down to 76,000 and his hemoglobin has dropped to 8.2 postop.  It was 11.8 preop.  I talked to the patient and we will give him 1 unit of platelets and 1 unit of blood this afternoon.  He is also followed by his hematologist Dr. Timmy who we may contact if there are issues.

## 2024-02-05 DIAGNOSIS — I712 Thoracic aortic aneurysm, without rupture, unspecified: Secondary | ICD-10-CM | POA: Diagnosis present

## 2024-02-05 DIAGNOSIS — Z8349 Family history of other endocrine, nutritional and metabolic diseases: Secondary | ICD-10-CM | POA: Diagnosis not present

## 2024-02-05 DIAGNOSIS — G4733 Obstructive sleep apnea (adult) (pediatric): Secondary | ICD-10-CM | POA: Diagnosis present

## 2024-02-05 DIAGNOSIS — Z79899 Other long term (current) drug therapy: Secondary | ICD-10-CM | POA: Diagnosis not present

## 2024-02-05 DIAGNOSIS — D693 Immune thrombocytopenic purpura: Secondary | ICD-10-CM | POA: Diagnosis present

## 2024-02-05 DIAGNOSIS — I2583 Coronary atherosclerosis due to lipid rich plaque: Secondary | ICD-10-CM | POA: Diagnosis not present

## 2024-02-05 DIAGNOSIS — K219 Gastro-esophageal reflux disease without esophagitis: Secondary | ICD-10-CM | POA: Diagnosis present

## 2024-02-05 DIAGNOSIS — E785 Hyperlipidemia, unspecified: Secondary | ICD-10-CM | POA: Diagnosis present

## 2024-02-05 DIAGNOSIS — I129 Hypertensive chronic kidney disease with stage 1 through stage 4 chronic kidney disease, or unspecified chronic kidney disease: Secondary | ICD-10-CM | POA: Diagnosis present

## 2024-02-05 DIAGNOSIS — I10 Essential (primary) hypertension: Secondary | ICD-10-CM | POA: Diagnosis not present

## 2024-02-05 DIAGNOSIS — I2510 Atherosclerotic heart disease of native coronary artery without angina pectoris: Secondary | ICD-10-CM | POA: Diagnosis present

## 2024-02-05 DIAGNOSIS — Z96649 Presence of unspecified artificial hip joint: Secondary | ICD-10-CM

## 2024-02-05 DIAGNOSIS — Z955 Presence of coronary angioplasty implant and graft: Secondary | ICD-10-CM | POA: Diagnosis not present

## 2024-02-05 DIAGNOSIS — D696 Thrombocytopenia, unspecified: Secondary | ICD-10-CM | POA: Diagnosis not present

## 2024-02-05 DIAGNOSIS — I480 Paroxysmal atrial fibrillation: Secondary | ICD-10-CM | POA: Diagnosis present

## 2024-02-05 DIAGNOSIS — I4891 Unspecified atrial fibrillation: Secondary | ICD-10-CM | POA: Diagnosis not present

## 2024-02-05 DIAGNOSIS — M1611 Unilateral primary osteoarthritis, right hip: Secondary | ICD-10-CM | POA: Diagnosis present

## 2024-02-05 DIAGNOSIS — N400 Enlarged prostate without lower urinary tract symptoms: Secondary | ICD-10-CM | POA: Diagnosis present

## 2024-02-05 DIAGNOSIS — D62 Acute posthemorrhagic anemia: Secondary | ICD-10-CM | POA: Diagnosis not present

## 2024-02-05 DIAGNOSIS — Z8674 Personal history of sudden cardiac arrest: Secondary | ICD-10-CM | POA: Diagnosis not present

## 2024-02-05 DIAGNOSIS — F17210 Nicotine dependence, cigarettes, uncomplicated: Secondary | ICD-10-CM | POA: Diagnosis present

## 2024-02-05 DIAGNOSIS — Z8249 Family history of ischemic heart disease and other diseases of the circulatory system: Secondary | ICD-10-CM | POA: Diagnosis not present

## 2024-02-05 DIAGNOSIS — Z96641 Presence of right artificial hip joint: Secondary | ICD-10-CM | POA: Diagnosis not present

## 2024-02-05 DIAGNOSIS — Z860100 Personal history of colon polyps, unspecified: Secondary | ICD-10-CM | POA: Diagnosis not present

## 2024-02-05 DIAGNOSIS — H409 Unspecified glaucoma: Secondary | ICD-10-CM | POA: Diagnosis present

## 2024-02-05 DIAGNOSIS — Z807 Family history of other malignant neoplasms of lymphoid, hematopoietic and related tissues: Secondary | ICD-10-CM | POA: Diagnosis not present

## 2024-02-05 DIAGNOSIS — Z952 Presence of prosthetic heart valve: Secondary | ICD-10-CM | POA: Diagnosis not present

## 2024-02-05 DIAGNOSIS — N1831 Chronic kidney disease, stage 3a: Secondary | ICD-10-CM | POA: Diagnosis present

## 2024-02-05 LAB — CBC WITH DIFFERENTIAL/PLATELET
Abs Immature Granulocytes: 0.07 K/uL (ref 0.00–0.07)
Basophils Absolute: 0 K/uL (ref 0.0–0.1)
Basophils Relative: 0 %
Eosinophils Absolute: 0 K/uL (ref 0.0–0.5)
Eosinophils Relative: 0 %
HCT: 20.9 % — ABNORMAL LOW (ref 39.0–52.0)
Hemoglobin: 7.4 g/dL — ABNORMAL LOW (ref 13.0–17.0)
Immature Granulocytes: 1 %
Lymphocytes Relative: 5 %
Lymphs Abs: 0.6 K/uL — ABNORMAL LOW (ref 0.7–4.0)
MCH: 32.3 pg (ref 26.0–34.0)
MCHC: 35.4 g/dL (ref 30.0–36.0)
MCV: 91.3 fL (ref 80.0–100.0)
Monocytes Absolute: 2 K/uL — ABNORMAL HIGH (ref 0.1–1.0)
Monocytes Relative: 16 %
Neutro Abs: 9.6 K/uL — ABNORMAL HIGH (ref 1.7–7.7)
Neutrophils Relative %: 78 %
Platelets: 62 K/uL — ABNORMAL LOW (ref 150–400)
RBC: 2.29 MIL/uL — ABNORMAL LOW (ref 4.22–5.81)
RDW: 15.2 % (ref 11.5–15.5)
WBC: 12.2 K/uL — ABNORMAL HIGH (ref 4.0–10.5)
nRBC: 0 % (ref 0.0–0.2)

## 2024-02-05 LAB — CBC
HCT: 22.4 % — ABNORMAL LOW (ref 39.0–52.0)
Hemoglobin: 7.8 g/dL — ABNORMAL LOW (ref 13.0–17.0)
MCH: 31.6 pg (ref 26.0–34.0)
MCHC: 34.8 g/dL (ref 30.0–36.0)
MCV: 90.7 fL (ref 80.0–100.0)
Platelets: 70 K/uL — ABNORMAL LOW (ref 150–400)
RBC: 2.47 MIL/uL — ABNORMAL LOW (ref 4.22–5.81)
RDW: 15 % (ref 11.5–15.5)
WBC: 13 K/uL — ABNORMAL HIGH (ref 4.0–10.5)
nRBC: 0 % (ref 0.0–0.2)

## 2024-02-05 LAB — BASIC METABOLIC PANEL WITH GFR
Anion gap: 13 (ref 5–15)
BUN: 25 mg/dL — ABNORMAL HIGH (ref 8–23)
CO2: 21 mmol/L — ABNORMAL LOW (ref 22–32)
Calcium: 8.1 mg/dL — ABNORMAL LOW (ref 8.9–10.3)
Chloride: 102 mmol/L (ref 98–111)
Creatinine, Ser: 1.64 mg/dL — ABNORMAL HIGH (ref 0.61–1.24)
GFR, Estimated: 44 mL/min — ABNORMAL LOW
Glucose, Bld: 135 mg/dL — ABNORMAL HIGH (ref 70–99)
Potassium: 4.2 mmol/L (ref 3.5–5.1)
Sodium: 136 mmol/L (ref 135–145)

## 2024-02-05 LAB — PREPARE RBC (CROSSMATCH)

## 2024-02-05 MED ORDER — SODIUM CHLORIDE 0.9% IV SOLUTION: Freq: Once | INTRAVENOUS | Status: AC

## 2024-02-05 MED ORDER — FUROSEMIDE 10 MG/ML IJ SOLN
20.0000 mg | Freq: Once | INTRAMUSCULAR | Status: AC
  Administered 2024-02-05: 20 mg via INTRAVENOUS
  Filled 2024-02-05: qty 2

## 2024-02-05 NOTE — Discharge Instructions (Signed)
 Per [facility] clinic policy, our goal is ensure optimal postoperative pain control with a multimodal pain management strategy. For all [facility] patients, our goal is to wean post-operative narcotic medications by 6 weeks post-operatively. If this is not possible due to utilization of pain medication prior to surgery, your [facility] doctor will support your acute post-operative pain control for the first 6 weeks postoperatively, with a plan to transition you back to your primary pain team following that. Maralee will work to ensure a Therapist, occupational.  INSTRUCTIONS AFTER JOINT REPLACEMENT   Remove items at home which could result in a fall. This includes throw rugs or furniture in walking pathways ICE to the affected joint every three hours while awake for 30 minutes at a time, for at least the first 3-5 days, and then as needed for pain and swelling.  Continue to use ice for pain and swelling. You may notice swelling that will progress down to the foot and ankle.  This is normal after surgery.  Elevate your leg when you are not up walking on it.   Continue to use the breathing machine you got in the hospital (incentive spirometer) which will help keep your temperature down.  It is common for your temperature to cycle up and down following surgery, especially at night when you are not up moving around and exerting yourself.  The breathing machine keeps your lungs expanded and your temperature down.   DIET:  As you were doing prior to hospitalization, we recommend a well-balanced diet.  DRESSING / WOUND CARE / SHOWERING  Keep the surgical dressing until follow up.  The dressing is water  proof, so you can shower without any extra covering.  IF THE DRESSING FALLS OFF or the wound gets wet inside, change the dressing with sterile gauze.  Please use good hand washing techniques before changing the dressing.  Do not use any lotions or creams on the incision until instructed by your surgeon.     ACTIVITY  Increase activity slowly as tolerated, but follow the weight bearing instructions below.   No driving for 6 weeks or until further direction given by your physician.  You cannot drive while taking narcotics.  No lifting or carrying greater than 10 lbs. until further directed by your surgeon. Avoid periods of inactivity such as sitting longer than an hour when not asleep. This helps prevent blood clots.  You may return to work once you are authorized by your doctor.     WEIGHT BEARING   Weight bearing as tolerated with assist device (walker, cane, etc) as directed, use it as long as suggested by your surgeon or therapist, typically at least 4-6 weeks.   EXERCISES  Results after joint replacement surgery are often greatly improved when you follow the exercise, range of motion and muscle strengthening exercises prescribed by your doctor. Safety measures are also important to protect the joint from further injury. Any time any of these exercises cause you to have increased pain or swelling, decrease what you are doing until you are comfortable again and then slowly increase them. If you have problems or questions, call your caregiver or physical therapist for advice.   Rehabilitation is important following a joint replacement. After just a few days of immobilization, the muscles of the leg can become weakened and shrink (atrophy).  These exercises are designed to build up the tone and strength of the thigh and leg muscles and to improve motion. Often times heat used for twenty to thirty minutes before  working out will loosen up your tissues and help with improving the range of motion but do not use heat for the first two weeks following surgery (sometimes heat can increase post-operative swelling).   These exercises can be done on a training (exercise) mat, on the floor, on a table or on a bed. Use whatever works the best and is most comfortable for you.    Use music or television  while you are exercising so that the exercises are a pleasant break in your day. This will make your life better with the exercises acting as a break in your routine that you can look forward to.   Perform all exercises about fifteen times, three times per day or as directed.  You should exercise both the operative leg and the other leg as well.  Exercises include:   Quad Sets - Tighten up the muscle on the front of the thigh (Quad) and hold for 5-10 seconds.   Straight Leg Raises - With your knee straight (if you were given a brace, keep it on), lift the leg to 60 degrees, hold for 3 seconds, and slowly lower the leg.  Perform this exercise against resistance later as your leg gets stronger.  Leg Slides: Lying on your back, slowly slide your foot toward your buttocks, bending your knee up off the floor (only go as far as is comfortable). Then slowly slide your foot back down until your leg is flat on the floor again.  Angel Wings: Lying on your back spread your legs to the side as far apart as you can without causing discomfort.  Hamstring Strength:  Lying on your back, push your heel against the floor with your leg straight by tightening up the muscles of your buttocks.  Repeat, but this time bend your knee to a comfortable angle, and push your heel against the floor.  You may put a pillow under the heel to make it more comfortable if necessary.   A rehabilitation program following joint replacement surgery can speed recovery and prevent re-injury in the future due to weakened muscles. Contact your doctor or a physical therapist for more information on knee rehabilitation.    CONSTIPATION  Constipation is defined medically as fewer than three stools per week and severe constipation as less than one stool per week.  Even if you have a regular bowel pattern at home, your normal regimen is likely to be disrupted due to multiple reasons following surgery.  Combination of anesthesia, postoperative  narcotics, change in appetite and fluid intake all can affect your bowels.   YOU MUST use at least one of the following options; they are listed in order of increasing strength to get the job done.  They are all available over the counter, and you may need to use some, POSSIBLY even all of these options:    Drink plenty of fluids (prune juice may be helpful) and high fiber foods Colace 100 mg by mouth twice a day  Senokot for constipation as directed and as needed Dulcolax (bisacodyl), take with full glass of water  Miralax (polyethylene glycol) once or twice a day as needed.  If you have tried all these things and are unable to have a bowel movement in the first 3-4 days after surgery call either your surgeon or your primary doctor.    If you experience loose stools or diarrhea, hold the medications until you stool forms back up.  If your symptoms do not get better within 1 week  or if they get worse, check with your doctor.  If you experience the worst abdominal pain ever or develop nausea or vomiting, please contact the office immediately for further recommendations for treatment.   ITCHING:  If you experience itching with your medications, try taking only a single pain pill, or even half a pain pill at a time.  You can also use Benadryl  over the counter for itching or also to help with sleep.   TED HOSE STOCKINGS:  Use stockings on both legs until for at least 2 weeks or as directed by physician office. They may be removed at night for sleeping.  MEDICATIONS:  See your medication summary on the "After Visit Summary" that nursing will review with you.  You may have some home medications which will be placed on hold until you complete the course of blood thinner medication.  It is important for you to complete the blood thinner medication as prescribed.  PRECAUTIONS:  If you experience chest pain or shortness of breath - call 911 immediately for transfer to the hospital emergency department.    If you develop a fever greater that 101 F, purulent drainage from wound, increased redness or drainage from wound, foul odor from the wound/dressing, or calf pain - CONTACT YOUR SURGEON.                                                   FOLLOW-UP APPOINTMENTS:  If you do not already have a post-op appointment, please call the office for an appointment to be seen by your surgeon.  Guidelines for how soon to be seen are listed in your "After Visit Summary", but are typically between 1-4 weeks after surgery.  OTHER INSTRUCTIONS:   Knee Replacement:  Do not place pillow under knee, focus on keeping the knee straight while resting. CPM instructions: 0-90 degrees, 2 hours in the morning, 2 hours in the afternoon, and 2 hours in the evening. Place foam block, curve side up under heel at all times except when in CPM or when walking.  DO NOT modify, tear, cut, or change the foam block in any way.  POST-OPERATIVE OPIOID TAPER INSTRUCTIONS: It is important to wean off of your opioid medication as soon as possible. If you do not need pain medication after your surgery it is ok to stop day one. Opioids include: Codeine, Hydrocodone(Norco, Vicodin), Oxycodone (Percocet, oxycontin ) and hydromorphone  amongst others.  Long term and even short term use of opiods can cause: Increased pain response Dependence Constipation Depression Respiratory depression And more.  Withdrawal symptoms can include Flu like symptoms Nausea, vomiting And more Techniques to manage these symptoms Hydrate well Eat regular healthy meals Stay active Use relaxation techniques(deep breathing, meditating, yoga) Do Not substitute Alcohol to help with tapering If you have been on opioids for less than two weeks and do not have pain than it is ok to stop all together.  Plan to wean off of opioids This plan should start within one week post op of your joint replacement. Maintain the same interval or time between taking each dose  and first decrease the dose.  Cut the total daily intake of opioids by one tablet each day Next start to increase the time between doses. The last dose that should be eliminated is the evening dose.   MAKE SURE YOU:  Understand these instructions.  Get help right away if you are not doing well or get worse.    Thank you for letting us  be a part of your medical care team.  It is a privilege we respect greatly.  We hope these instructions will help you stay on track for a fast and full recovery!     Dental Antibiotics:  In most cases prophylactic antibiotics for Dental procdeures after total joint surgery are not necessary.  Exceptions are as follows:  1. History of prior total joint infection  2. Severely immunocompromised (Organ Transplant, cancer chemotherapy, Rheumatoid biologic meds such as Humera)  3. Poorly controlled diabetes (A1C &gt; 8.0, blood glucose over 200)  If you have one of these conditions, contact your surgeon for an antibiotic prescription, prior to your dental procedure.

## 2024-02-05 NOTE — Care Management Obs Status (Signed)
 MEDICARE OBSERVATION STATUS NOTIFICATION   Patient Details  Name: Lonnie Hansen MRN: [id number] Date of Birth: 26-Jun-1950   Medicare Observation Status Notification Given:  Yes    Fayette Gasner A Eveline Sauve, LCSW 02/05/2024, 10:01 AM

## 2024-02-05 NOTE — Evaluation (Signed)
 Physical Therapy Evaluation Patient Details Name: Lonnie Hansen MRN: [id number] DOB: [birth year] Today's Date: 02/05/2024  History of Present Illness  Pt s/p R THR and with hx of CAD, GIB, AAA, CAD, bil carotid stenosis, Aortic valve replacement and chronic ITP  Clinical Impression  Pt s/p R THR and presents with decreased R LE strength/ROM and post op pain limiting functional mobility.  This am, HEP initiated but OOB deferred pending Drs review of BP and blood work.  Pt hopes to progress to return home with assist of family.        If plan is discharge home, recommend the following: A lot of help with walking and/or transfers;A lot of help with bathing/dressing/bathroom;Assistance with cooking/housework;Assist for transportation;Help with stairs or ramp for entrance   Can travel by private vehicle        Equipment Recommendations Rolling walker (2 wheels)  Recommendations for Other Services       Functional Status Assessment Patient has had a recent decline in their functional status and demonstrates the ability to make significant improvements in function in a reasonable and predictable amount of time.     Precautions / Restrictions Precautions Precautions: Fall Restrictions Weight Bearing Restrictions Per Provider Order: Yes RLE Weight Bearing Per Provider Order: Weight bearing as tolerated      Mobility  Bed Mobility Overal bed mobility:  (OOB deferred pending Dr visit and review of blood work)                  Best Boy Bed    Modified Rankin (Stroke Patients Only)       Balance                                             Pertinent Vitals/Pain Pain Assessment Pain Assessment: 0-10 Pain Score: 2  Pain Location: R hip Pain Descriptors / Indicators: Aching, Sore Pain Intervention(s): Limited activity  within patient's tolerance, Premedicated before session, Monitored during session, Ice applied    Home Living Family/patient expects to be discharged to:: Unsure (Pt states wife may want him to go SNF) Living Arrangements: Spouse/significant other Available Help at Discharge: Family;Available 24 hours/day Type of Home: House Home Access: Stairs to enter Entrance Stairs-Rails: Right Entrance Stairs-Number of Steps: 4   Home Layout: Able to live on main level with bedroom/bathroom Home Equipment: Rollator (4 wheels) Additional Comments: goes to [facility]    Prior Function Prior Level of Function : Independent/Modified Independent             Mobility Comments: uses rollator in home and cane when he goes out       Extremity/Trunk Assessment   Upper Extremity Assessment Upper Extremity Assessment: Overall WFL for tasks assessed    Lower Extremity Assessment Lower Extremity Assessment: RLE deficits/detail RLE Deficits / Details: AAROM at hip to 90 flex and 20 abd; 2/5 strength at hip    Cervical / Trunk Assessment Cervical / Trunk Assessment: Normal  Communication   Communication Communication: No apparent difficulties    Cognition  Arousal: Alert Behavior During Therapy: WFL for tasks assessed/performed   PT - Cognitive impairments: No apparent impairments                         Following commands: Intact       Cueing Cueing Techniques: Verbal cues     General Comments      Exercises Total Joint Exercises Ankle Circles/Pumps: AROM, Both, 15 reps, Supine Quad Sets: AROM, Both, 10 reps, Supine Heel Slides: AAROM, Right, 20 reps, Supine Hip ABduction/ADduction: AAROM, Right, 15 reps, Supine   Assessment/Plan    PT Assessment Patient needs continued PT services  PT Problem List Decreased strength;Decreased range of motion;Decreased activity tolerance;Decreased balance;Decreased mobility;Decreased knowledge of use of DME;Pain       PT Treatment  Interventions DME instruction;Functional mobility training;Gait training;Stair training;Therapeutic activities;Therapeutic exercise;Patient/family education    PT Goals (Current goals can be found in the Care Plan section)  Acute Rehab PT Goals Patient Stated Goal: Regain IND PT Goal Formulation: With patient Time For Goal Achievement: 02/19/24 Potential to Achieve Goals: Good    Frequency 7X/week     Co-evaluation               AM-PAC PT 6 Clicks Mobility  Outcome Measure Help needed turning from your back to your side while in a flat bed without using bedrails?: Total Help needed moving from lying on your back to sitting on the side of a flat bed without using bedrails?: Total Help needed moving to and from a bed to a chair (including a wheelchair)?: Total Help needed standing up from a chair using your arms (e.g., wheelchair or bedside chair)?: Total Help needed to walk in hospital room?: Total Help needed climbing 3-5 steps with a railing? : Total 6 Click Score: 6    End of Session Equipment Utilized During Treatment: Gait belt Activity Tolerance: Patient tolerated treatment well Patient left: in bed;with call bell/phone within reach   PT Visit Diagnosis: Difficulty in walking, not elsewhere classified (R26.2);Muscle weakness (generalized) (M62.81);Pain Pain - Right/Left: Right Pain - part of body: Hip    Time: 0925-0955 PT Time Calculation (min) (ACUTE ONLY): 30 min   Charges:   PT Evaluation $PT Eval Low Complexity: 1 Low   PT General Charges $$ ACUTE PT VISIT: 1 Visit         [facility] PT Acute Rehabilitation Services Office [phone]   Legaci Tarman 02/05/2024, 1:22 PM

## 2024-02-05 NOTE — Plan of Care (Signed)
" °  Problem: Pain Management: Goal: Pain level will decrease with appropriate interventions Outcome: Progressing   Problem: Education: Goal: Knowledge of the prescribed therapeutic regimen will improve Outcome: Progressing   Problem: Safety: Goal: Ability to remain free from injury will improve Outcome: Progressing   Problem: Elimination: Goal: Will not experience complications related to urinary retention Outcome: Progressing   Problem: Coping: Goal: Level of anxiety will decrease Outcome: Progressing   "

## 2024-02-05 NOTE — Progress Notes (Signed)
" °   02/05/24 1934  BiPAP/CPAP/SIPAP  BiPAP/CPAP/SIPAP Pt Type Adult  Reason BIPAP/CPAP not in use Non-compliant    "

## 2024-02-05 NOTE — Progress Notes (Signed)
 Subjective: 1 Day Post-Op Procedures (LRB): ARTHROPLASTY, HIP, TOTAL, ANTERIOR APPROACH (Right) Patient reports pain as moderate.  The patient does have a history of chronic ITP and occasionally gets Nplate  infusions sometimes on a weekly basis.  Prior to surgery his platelets were 120,000 send no Nplate  was given.  The patient tolerated surgery well yesterday, but does have low platelets and as result of low hemoglobin as well.  The blood loss from surgery was about 250 cc but obviously due to his ITP, he does have acute blood loss anemia as well.  He did get 1 unit of blood yesterday and 1 unit of platelets.  His vital signs are stable this morning.  Objective: Vital signs in last 24 hours: Temp:  [97.4 F (36.3 C)-98.2 F (36.8 C)] 97.9 F (36.6 C) (01/10 0826) Pulse Rate:  [55-85] 85 (01/10 0952) Resp:  [15-18] 18 (01/10 0952) BP: (64-143)/(46-86) 101/66 (01/10 0952) SpO2:  [93 %-100 %] 97 % (01/10 0952) Weight:  [82.4 kg] 82.4 kg (01/09 1442)  Intake/Output from previous day: 01/09 0701 - 01/10 0700 In: 4453.7 [P.O.:540; I.V.:3119.7; Blood:594; IV Piggyback:200] Out: 1600 [Urine:1350; Blood:250] Intake/Output this shift: No intake/output data recorded.  Recent Labs    02/04/24 1323 02/05/24 0313 02/05/24 0519  HGB 8.2* 7.8* 7.4*   Recent Labs    02/05/24 0313 02/05/24 0519  WBC 13.0* 12.2*  RBC 2.47* 2.29*  HCT 22.4* 20.9*  PLT 70* 62*   Recent Labs    02/05/24 0313  NA 136  K 4.2  CL 102  CO2 21*  BUN 25*  CREATININE 1.64*  GLUCOSE 135*  CALCIUM  8.1*   No results for input(s): LABPT, INR in the last 72 hours.  Sensation intact distally Intact pulses distally Dorsiflexion/Plantar flexion intact Incision: scant drainage   Assessment/Plan: 1 Day Post-Op Procedures (LRB): ARTHROPLASTY, HIP, TOTAL, ANTERIOR APPROACH (Right) Up with therapy We will plan on transfusing an additional 2 units of blood today and recheck the CBC tomorrow. The goal was  originally to have the patient go home once he is cleared from a PT standpoint with just home health PT.  His wife is asked for the possibility of short-term skilled nursing.  I at least talked the patient and said lets see how he does over the weekend prior to considering any type of skilled nursing.     [name] 02/05/2024, 11:41 AM

## 2024-02-05 NOTE — Progress Notes (Signed)
 2 units PRBCS ordered for pt. Prior to 1st unit pts oral Temp is 98.9, 15 minutes into first unit pts temp is 97.8. At end of first unit pts temp is 99. Prior to starting second unit pts temp is 99.3. Blood started and provider on call, Magnant, MD paged. All other vital signs are stable. Blood paused, and Provider responded via secure chat and said ok to give pt tylenol , continue with blood and monitor pt. Pt reports having no symptoms. Tylenol  given, and blood resumed.

## 2024-02-05 NOTE — Progress Notes (Signed)
 Physical Therapy Treatment Patient Details Name: Lonnie Hansen MRN: [id number] DOB: [birth year] Today's Date: 02/05/2024   History of Present Illness Pt s/p R THR and with hx of CAD, GIB, AAA, CAD, bil carotid stenosis, Aortic valve replacement and chronic ITP    PT Comments  Pt requiring increased time and assist for all tasks but very cooperative and this pm up to EOB sitting, to standing and ambulated limited distance in room.  Pt reports minimal pain and pleased to be up in chair for first time since surgery.    If plan is discharge home, recommend the following: A lot of help with walking and/or transfers;A lot of help with bathing/dressing/bathroom;Assistance with cooking/housework;Assist for transportation;Help with stairs or ramp for entrance   Can travel by private vehicle        Equipment Recommendations  Rolling walker (2 wheels)    Recommendations for Other Services       Precautions / Restrictions Precautions Precautions: Fall Restrictions Weight Bearing Restrictions Per Provider Order: Yes RLE Weight Bearing Per Provider Order: Weight bearing as tolerated     Mobility  Bed Mobility Overal bed mobility: Needs Assistance Bed Mobility: Supine to Sit     Supine to sit: Min assist, Mod assist, +2 for physical assistance, +2 for safety/equipment, Used rails     General bed mobility comments: Increased time with cues for sequence and use of L LE to self assist.  Physical assist to manage R LE, to control trunk and to complete rotation to EOB sitting with bed pad    Transfers Overall transfer level: Needs assistance Equipment used: Rolling walker (2 wheels) Transfers: Sit to/from Stand Sit to Stand: Min assist, +2 physical assistance, +2 safety/equipment, From elevated surface           General transfer comment: cues for LE management and use of UEs to self assist.  Physical assist to bring wt up and fwd and to balance in initial standing with RW     Ambulation/Gait Ambulation/Gait assistance: Min assist, +2 safety/equipment Gait Distance (Feet): 17 Feet Assistive device: Rolling walker (2 wheels) Gait Pattern/deviations: Step-to pattern, Decreased step length - right, Decreased step length - left, Shuffle, Trunk flexed Gait velocity: decr     General Gait Details: cues for posture, position from RW and initial sequence   Stairs             Wheelchair Mobility     Tilt Bed    Modified Rankin (Stroke Patients Only)       Balance Overall balance assessment: Needs assistance Sitting-balance support: No upper extremity supported, Feet supported Sitting balance-Leahy Scale: Fair     Standing balance support: Bilateral upper extremity supported Standing balance-Leahy Scale: Poor                              Communication Communication Communication: No apparent difficulties  Cognition Arousal: Alert Behavior During Therapy: WFL for tasks assessed/performed   PT - Cognitive impairments: No apparent impairments                         Following commands: Intact      Cueing Cueing Techniques: Verbal cues  Exercises Total Joint Exercises Ankle Circles/Pumps: AROM, Both, 15 reps, Supine Quad Sets: AROM, Both, 10 reps, Supine Heel Slides: AAROM, Right, 20 reps, Supine Hip ABduction/ADduction: AAROM, Right, 15 reps, Supine    General Comments  Pertinent Vitals/Pain Pain Assessment Pain Assessment: 0-10 Pain Score: 3  Pain Location: R hip Pain Descriptors / Indicators: Aching, Sore Pain Intervention(s): Limited activity within patient's tolerance, Monitored during session, Premedicated before session, Ice applied    Home Living Family/patient expects to be discharged to:: Unsure (Pt states wife may want him to go SNF) Living Arrangements: Spouse/significant other Available Help at Discharge: Family;Available 24 hours/day Type of Home: House Home Access: Stairs to  enter Entrance Stairs-Rails: Right Entrance Stairs-Number of Steps: 4   Home Layout: Able to live on main level with bedroom/bathroom Home Equipment: Rollator (4 wheels) Additional Comments: goes to [facility]    Prior Function            PT Goals (current goals can now be found in the care plan section) Acute Rehab PT Goals Patient Stated Goal: Regain IND PT Goal Formulation: With patient Time For Goal Achievement: 02/19/24 Potential to Achieve Goals: Good Progress towards PT goals: Progressing toward goals    Frequency    7X/week      PT Plan      Co-evaluation              AM-PAC PT 6 Clicks Mobility   Outcome Measure  Help needed turning from your back to your side while in a flat bed without using bedrails?: A Lot Help needed moving from lying on your back to sitting on the side of a flat bed without using bedrails?: A Lot Help needed moving to and from a bed to a chair (including a wheelchair)?: A Lot Help needed standing up from a chair using your arms (e.g., wheelchair or bedside chair)?: A Lot Help needed to walk in hospital room?: A Lot Help needed climbing 3-5 steps with a railing? : Total 6 Click Score: 11    End of Session Equipment Utilized During Treatment: Gait belt Activity Tolerance: Patient tolerated treatment well Patient left: in chair;with call bell/phone within reach;with chair alarm set Nurse Communication: Mobility status PT Visit Diagnosis: Difficulty in walking, not elsewhere classified (R26.2);Muscle weakness (generalized) (M62.81);Pain Pain - Right/Left: Right Pain - part of body: Hip     Time: 8550-8486 PT Time Calculation (min) (ACUTE ONLY): 24 min  Charges:    $Gait Training: 8-22 mins $Therapeutic Activity: 8-22 mins PT General Charges $$ ACUTE PT VISIT: 1 Visit                     [facility] PT Acute Rehabilitation Services Office [phone]    Kemper Heupel 02/05/2024, 3:49 PM

## 2024-02-05 NOTE — TOC Transition Note (Addendum)
 Transition of Care [facility]) - Discharge Note   Patient Details  Name: Lonnie Hansen MRN: [id number] Date of Birth: 11-08-1950  Transition of [facility]) CM/SW Contact:  Heather DELENA Saltness, LCSW Phone Number: 02/05/2024, 10:18 AM   Clinical Narrative:    Pt to discharge home with [facility] through [facility], prearranged at Ortho office. Pt reports no DME needs, has RW and cane at home. HH orders placed. No further TOC needs at this time.   Final next level of care: Home w Home Health Services Barriers to Discharge: Barriers Resolved, Continued Medical Work up   Patient Goals and CMS Choice Patient states their goals for this hospitalization and ongoing recovery are:: To return home CMS Medicare.gov Compare Post Acute Care list provided to:: Patient Choice offered to / list presented to : Patient [place] ownership interest in [facility].provided to:: Patient    Discharge Placement  Patient to be transferred to facility by: Family Name of family member notified: Patient Patient and family notified of of transfer: 02/05/24  Discharge Plan and Services Additional resources added to the After Visit Summary for  Follow Up                DME Arranged: N/A DME Agency: NA       HH Arranged: PT HH Agency: [facility] Date HH Agency Contacted: 02/05/24 Time HH Agency Contacted: 1017 Representative spoke with at [facility] Agency: Prearranged at [employer] office  Social Drivers of Health (SDOH) Interventions SDOH Screenings   Food Insecurity: No Food Insecurity (02/04/2024)  Housing: Low Risk (02/04/2024)  Transportation Needs: No Transportation Needs (02/04/2024)  Utilities: Not At Risk (02/04/2024)  Depression (PHQ2-9): Low Risk (01/17/2024)  Social Connections: Socially Integrated (02/04/2024)  Tobacco Use: Medium Risk (02/04/2024)     Readmission Risk Interventions    10/09/2022   12:47 PM 08/19/2021   11:21 AM  Readmission Risk Prevention Plan  Post Dischage Appt Complete  Complete  Medication Screening Complete Complete  Transportation Screening Complete Complete     Signed: Heather Saltness, MSW, LCSW Clinical Social Worker Inpatient Care Management 02/05/2024 10:19 AM

## 2024-02-06 LAB — CBC
HCT: 23.4 % — ABNORMAL LOW (ref 39.0–52.0)
Hemoglobin: 8.1 g/dL — ABNORMAL LOW (ref 13.0–17.0)
MCH: 30.6 pg (ref 26.0–34.0)
MCHC: 34.6 g/dL (ref 30.0–36.0)
MCV: 88.3 fL (ref 80.0–100.0)
Platelets: 41 K/uL — ABNORMAL LOW (ref 150–400)
RBC: 2.65 MIL/uL — ABNORMAL LOW (ref 4.22–5.81)
RDW: 15.9 % — ABNORMAL HIGH (ref 11.5–15.5)
WBC: 9.8 K/uL (ref 4.0–10.5)
nRBC: 0.2 % (ref 0.0–0.2)

## 2024-02-06 NOTE — Progress Notes (Signed)
" °   02/06/24 2216  BiPAP/CPAP/SIPAP  BiPAP/CPAP/SIPAP Pt Type Adult  Reason BIPAP/CPAP not in use Non-compliant    "

## 2024-02-06 NOTE — Progress Notes (Signed)
 Physical Therapy Treatment Patient Details Name: Lonnie Hansen MRN: [id number] DOB: Aug 04, 1950 Today's Date: 02/06/2024   History of Present Illness Pt s/p R THR and with hx of CAD, GIB, AAA, CAD, bil carotid stenosis, Aortic valve replacement and chronic ITP    PT Comments  Pt continues very cooperative but progressing slowly with mobility and limited by fatigue and c/o lightheadedness with OOB activity.  Will follow up in pm.    If plan is discharge home, recommend the following: A lot of help with walking and/or transfers;A lot of help with bathing/dressing/bathroom;Assistance with cooking/housework;Assist for transportation;Help with stairs or ramp for entrance   Can travel by private vehicle        Equipment Recommendations  Rolling walker (2 wheels)    Recommendations for Other Services       Precautions / Restrictions Precautions Precautions: Fall Restrictions Weight Bearing Restrictions Per Provider Order: Yes RLE Weight Bearing Per Provider Order: Weight bearing as tolerated     Mobility  Bed Mobility Overal bed mobility: Needs Assistance Bed Mobility: Supine to Sit     Supine to sit: Min assist, Mod assist, +2 for physical assistance, +2 for safety/equipment, Used rails     General bed mobility comments: Increased time with cues for sequence and use of L LE to self assist.  Physical assist to manage R LE, to control trunk and to complete rotation to EOB sitting with bed pad    Transfers Overall transfer level: Needs assistance Equipment used: Rolling walker (2 wheels) Transfers: Sit to/from Stand Sit to Stand: Min assist, +2 physical assistance, +2 safety/equipment, From elevated surface           General transfer comment: cues for LE management and use of UEs to self assist.  Physical assist to bring wt up and fwd and to balance in initial standing with RW    Ambulation/Gait Ambulation/Gait assistance: Min assist, +2 safety/equipment Gait Distance  (Feet): 6 Feet Assistive device: Rolling walker (2 wheels) Gait Pattern/deviations: Step-to pattern, Decreased step length - right, Decreased step length - left, Shuffle, Trunk flexed Gait velocity: decr     General Gait Details: cues for posture, position from RW and initial sequence; distance ltd by c/o lightheadedness   Stairs             Wheelchair Mobility     Tilt Bed    Modified Rankin (Stroke Patients Only)       Balance Overall balance assessment: Needs assistance Sitting-balance support: No upper extremity supported, Feet supported Sitting balance-Leahy Scale: Fair     Standing balance support: Bilateral upper extremity supported Standing balance-Leahy Scale: Poor                              Communication Communication Communication: No apparent difficulties  Cognition Arousal: Alert Behavior During Therapy: WFL for tasks assessed/performed   PT - Cognitive impairments: No apparent impairments                         Following commands: Intact      Cueing Cueing Techniques: Verbal cues  Exercises Total Joint Exercises Ankle Circles/Pumps: AROM, Both, 15 reps, Supine Quad Sets: AROM, Both, 10 reps, Supine Heel Slides: AAROM, Right, 20 reps, Supine Hip ABduction/ADduction: AAROM, Right, 15 reps, Supine    General Comments        Pertinent Vitals/Pain Pain Assessment Pain Assessment: 0-10 Pain Score: 4  Pain Location: R hip with activity Pain Descriptors / Indicators: Aching, Sore Pain Intervention(s): Limited activity within patient's tolerance, Monitored during session, Premedicated before session, Ice applied    Home Living                          Prior Function            PT Goals (current goals can now be found in the care plan section) Acute Rehab PT Goals Patient Stated Goal: Regain IND PT Goal Formulation: With patient Time For Goal Achievement: 02/19/24 Potential to Achieve Goals:  Good Progress towards PT goals: Not progressing toward goals - comment (ltd by fatigue/lightheadedness with mobilty)    Frequency    7X/week      PT Plan      Co-evaluation              AM-PAC PT 6 Clicks Mobility   Outcome Measure  Help needed turning from your back to your side while in a flat bed without using bedrails?: A Lot Help needed moving from lying on your back to sitting on the side of a flat bed without using bedrails?: A Lot Help needed moving to and from a bed to a chair (including a wheelchair)?: A Lot Help needed standing up from a chair using your arms (e.g., wheelchair or bedside chair)?: A Lot Help needed to walk in hospital room?: A Lot Help needed climbing 3-5 steps with a railing? : Total 6 Click Score: 11    End of Session Equipment Utilized During Treatment: Gait belt Activity Tolerance: Patient tolerated treatment well Patient left: in chair;with call bell/phone within reach;with chair alarm set Nurse Communication: Mobility status PT Visit Diagnosis: Difficulty in walking, not elsewhere classified (R26.2);Muscle weakness (generalized) (M62.81);Pain Pain - Right/Left: Right Pain - part of body: Hip     Time: 9065-8993 PT Time Calculation (min) (ACUTE ONLY): 32 min  Charges:    $Therapeutic Exercise: 8-22 mins $Therapeutic Activity: 8-22 mins PT General Charges $$ ACUTE PT VISIT: 1 Visit                     [facility] PT Acute Rehabilitation Services Office [phone]    Makeda Peeks 02/06/2024, 4:29 PM

## 2024-02-06 NOTE — Progress Notes (Signed)
" °  Subjective: Lonnie Hansen is a 74 y.o. male s/p right THA.  They are POD2.  Pt's pain is controlled.  Pt has ambulated with PT about 17 feet yesterday. No PT yet today. Patient denies any chest pain, dyspnea, or calf pain.  Ambulated with physical therapy in the room but not the hall today.  Says that it was very painful and he is still struggling with mobility.  Had 2 units transfused yesterday and is up to hemoglobin of 8.1.  Platelets still low at 40K.  Aspirin  discontinued.  Objective: Vital signs in last 24 hours: Temp:  [97.8 F (36.6 C)-99.6 F (37.6 C)] 98.7 F (37.1 C) (01/11 0619) Pulse Rate:  [79-92] 92 (01/11 0955) Resp:  [15-18] 16 (01/11 0955) BP: (108-129)/(62-80) 129/80 (01/11 0955) SpO2:  [97 %-100 %] 99 % (01/11 0955)  Intake/Output from previous day: 01/10 0701 - 01/11 0700 In: 1852 [P.O.:360; I.V.:875; Blood:617] Out: 2850 [Urine:2850] Intake/Output this shift: Total I/O In: -  Out: 120 [Urine:120]  Exam:  No gross blood or drainage overlying the dressing Palpable pedal pulses Able to dorsiflex and plantarflex the ankle of the operative leg No calf tenderness bilaterally. Negative Homan's sign bilaterally Leg lengths are roughly equivalent   Labs: Recent Labs    02/04/24 1323 02/05/24 0313 02/05/24 0519 02/06/24 0316  HGB 8.2* 7.8* 7.4* 8.1*   Recent Labs    02/05/24 0519 02/06/24 0316  WBC 12.2* 9.8  RBC 2.29* 2.65*  HCT 20.9* 23.4*  PLT 62* 41*   Recent Labs    02/05/24 0313  NA 136  K 4.2  CL 102  CO2 21*  BUN 25*  CREATININE 1.64*  GLUCOSE 135*  CALCIUM  8.1*   No results for input(s): LABPT, INR in the last 72 hours.  Assessment/Plan: Pt is s/p THA  -Plan to discharge to home vs SNF in coming days pending patient's pain and PT eval  - Plan to recheck CBC tomorrow morning  -WBAT with a walker     [employer] 02/06/2024, 10:37 AM       "

## 2024-02-06 NOTE — Plan of Care (Signed)
  Problem: Safety: Goal: Ability to remain free from injury will improve Outcome: Progressing   Problem: Pain Managment: Goal: General experience of comfort will improve and/or be controlled Outcome: Progressing   Problem: Elimination: Goal: Will not experience complications related to urinary retention Outcome: Progressing   Problem: Coping: Goal: Level of anxiety will decrease Outcome: Progressing

## 2024-02-06 NOTE — Progress Notes (Signed)
 Physical Therapy Treatment Patient Details Name: Lonnie Hansen MRN: [id number] DOB: [birth year] Today's Date: 02/06/2024   History of Present Illness Pt s/p R THR and with hx of CAD, GIB, AAA, CAD, bil carotid stenosis, Aortic valve replacement and chronic ITP    PT Comments  Pt very cooperative and with marked improvement in activity tolerance this pm but continues to require increased time and significant assist for all basic mobility tasks.  Pt feeling better this pm and states hopeful to progress to dc home tomorrow.    If plan is discharge home, recommend the following: A lot of help with walking and/or transfers;A lot of help with bathing/dressing/bathroom;Assistance with cooking/housework;Assist for transportation;Help with stairs or ramp for entrance   Can travel by private vehicle        Equipment Recommendations  Rolling walker (2 wheels)    Recommendations for Other Services       Precautions / Restrictions Precautions Precautions: Fall Restrictions Weight Bearing Restrictions Per Provider Order: Yes RLE Weight Bearing Per Provider Order: Weight bearing as tolerated     Mobility  Bed Mobility Overal bed mobility: Needs Assistance Bed Mobility: Sit to Supine     Supine to sit: Min assist, Mod assist, +2 for physical assistance, +2 for safety/equipment, Used rails Sit to supine: Min assist, Mod assist, +2 for physical assistance, +2 for safety/equipment   General bed mobility comments: Increased time with cues for sequence and use of L LE to self assist.  Physical assist to manage R LE, to control trunk    Transfers Overall transfer level: Needs assistance Equipment used: Rolling walker (2 wheels) Transfers: Sit to/from Stand Sit to Stand: Min assist, +2 physical assistance, +2 safety/equipment, From elevated surface           General transfer comment: cues for LE management and use of UEs to self assist.  Physical assist to bring wt up and fwd and to  balance in initial standing with RW    Ambulation/Gait Ambulation/Gait assistance: Min assist, +2 safety/equipment Gait Distance (Feet): 42 Feet Assistive device: Rolling walker (2 wheels) Gait Pattern/deviations: Step-to pattern, Decreased step length - right, Decreased step length - left, Shuffle, Trunk flexed Gait velocity: decr     General Gait Details: Increased time with initial difficulty advancing R LE; cues for posture, position from RW and initial sequence; no c/o lightheadedness this pm   Stairs             Wheelchair Mobility     Tilt Bed    Modified Rankin (Stroke Patients Only)       Balance Overall balance assessment: Needs assistance Sitting-balance support: No upper extremity supported, Feet supported Sitting balance-Leahy Scale: Fair     Standing balance support: Bilateral upper extremity supported Standing balance-Leahy Scale: Poor                              Communication Communication Communication: No apparent difficulties  Cognition Arousal: Alert Behavior During Therapy: WFL for tasks assessed/performed   PT - Cognitive impairments: No apparent impairments                         Following commands: Intact      Cueing Cueing Techniques: Verbal cues  Exercises Total Joint Exercises Ankle Circles/Pumps: AROM, Both, 15 reps, Supine Quad Sets: AROM, Both, 10 reps, Supine Heel Slides: AAROM, Right, 20 reps, Supine Hip ABduction/ADduction: AAROM, Right,  15 reps, Supine    General Comments        Pertinent Vitals/Pain Pain Assessment Pain Assessment: 0-10 Pain Score: 4  Pain Location: R hip with activity Pain Descriptors / Indicators: Aching, Sore Pain Intervention(s): Limited activity within patient's tolerance, Monitored during session, Premedicated before session, Ice applied    Home Living                          Prior Function            PT Goals (current goals can now be found in  the care plan section) Acute Rehab PT Goals Patient Stated Goal: Regain IND PT Goal Formulation: With patient Time For Goal Achievement: 02/19/24 Potential to Achieve Goals: Good Progress towards PT goals: Progressing toward goals    Frequency    7X/week      PT Plan      Co-evaluation              AM-PAC PT 6 Clicks Mobility   Outcome Measure  Help needed turning from your back to your side while in a flat bed without using bedrails?: A Lot Help needed moving from lying on your back to sitting on the side of a flat bed without using bedrails?: A Lot Help needed moving to and from a bed to a chair (including a wheelchair)?: A Lot Help needed standing up from a chair using your arms (e.g., wheelchair or bedside chair)?: A Lot Help needed to walk in hospital room?: A Lot Help needed climbing 3-5 steps with a railing? : A Lot 6 Click Score: 12    End of Session Equipment Utilized During Treatment: Gait belt Activity Tolerance: Patient tolerated treatment well Patient left: with call bell/phone within reach;in bed;with bed alarm set Nurse Communication: Mobility status PT Visit Diagnosis: Difficulty in walking, not elsewhere classified (R26.2);Muscle weakness (generalized) (M62.81);Pain Pain - Right/Left: Right Pain - part of body: Hip     Time: 8484-8457 PT Time Calculation (min) (ACUTE ONLY): 27 min  Charges:    $Gait Training: 23-37 mins $Therapeutic Exercise: 8-22 mins $Therapeutic Activity: 8-22 mins PT General Charges $$ ACUTE PT VISIT: 1 Visit                     [facility] PT Acute Rehabilitation Services Office [phone]    Merwin Breden 02/06/2024, 4:34 PM

## 2024-02-06 NOTE — Plan of Care (Signed)
   Problem: Coping: Goal: Level of anxiety will decrease Outcome: Progressing   Problem: Pain Managment: Goal: General experience of comfort will improve and/or be controlled Outcome: Progressing   Problem: Safety: Goal: Ability to remain free from injury will improve Outcome: Progressing

## 2024-02-06 NOTE — Progress Notes (Addendum)
 Platelets 41,000 this morning.  Will discontinue aspirin  Recommend ultrasound bilateral lower extremities to rule out DVT tomorrow prior to discharge.

## 2024-02-07 DIAGNOSIS — D696 Thrombocytopenia, unspecified: Secondary | ICD-10-CM | POA: Diagnosis not present

## 2024-02-07 DIAGNOSIS — M1611 Unilateral primary osteoarthritis, right hip: Secondary | ICD-10-CM | POA: Diagnosis not present

## 2024-02-07 LAB — TYPE AND SCREEN
ABO/RH(D): AB POS
Antibody Screen: NEGATIVE
Donor AG Type: NEGATIVE
Unit division: 0
Unit division: 0
Unit division: 0
Unit division: 0
Unit division: 0
Unit division: 0
Unit division: 0

## 2024-02-07 LAB — BPAM PLATELET PHERESIS
Blood Product Expiration Date: 202601112359
ISSUE DATE / TIME: 202601100009
Unit Type and Rh: 202601112359
Unit Type and Rh: 5100

## 2024-02-07 LAB — BPAM RBC
Blood Product Expiration Date: 202601262359
Blood Product Expiration Date: 202601262359
Blood Product Expiration Date: 202602082359
Blood Product Expiration Date: 202602082359
ISSUE DATE / TIME: 202601091629
ISSUE DATE / TIME: 202601101345
ISSUE DATE / TIME: 202601101644
Unit Type and Rh: 6200
Unit Type and Rh: 6200
Unit Type and Rh: 6200
Unit Type and Rh: 6200

## 2024-02-07 LAB — CBC
HCT: 21.8 % — ABNORMAL LOW (ref 39.0–52.0)
Hemoglobin: 7.3 g/dL — ABNORMAL LOW (ref 13.0–17.0)
MCH: 30.7 pg (ref 26.0–34.0)
MCHC: 33.5 g/dL (ref 30.0–36.0)
MCV: 91.6 fL (ref 80.0–100.0)
Platelets: 43 K/uL — ABNORMAL LOW (ref 150–400)
RBC: 2.38 MIL/uL — ABNORMAL LOW (ref 4.22–5.81)
RDW: 15.6 % — ABNORMAL HIGH (ref 11.5–15.5)
WBC: 9.3 K/uL (ref 4.0–10.5)
nRBC: 0 % (ref 0.0–0.2)

## 2024-02-07 LAB — IRON AND TIBC
Iron: 24 ug/dL — ABNORMAL LOW (ref 45–182)
Saturation Ratios: 10 % — ABNORMAL LOW (ref 17.9–39.5)
TIBC: 228 ug/dL — ABNORMAL LOW (ref 250–450)
UIBC: 205 ug/dL

## 2024-02-07 LAB — PREPARE PLATELET PHERESIS: Unit division: 0

## 2024-02-07 LAB — PREPARE RBC (CROSSMATCH)

## 2024-02-07 MED ORDER — FUROSEMIDE 10 MG/ML IJ SOLN
20.0000 mg | Freq: Once | INTRAMUSCULAR | Status: AC
  Administered 2024-02-07: 20 mg via INTRAVENOUS
  Filled 2024-02-07: qty 2

## 2024-02-07 MED ORDER — EPOETIN ALFA 20000 UNIT/ML IJ SOLN
40000.0000 [IU] | Freq: Once | INTRAMUSCULAR | Status: AC
  Administered 2024-02-07: 40000 [IU] via SUBCUTANEOUS
  Filled 2024-02-07: qty 1
  Filled 2024-02-07: qty 2

## 2024-02-07 MED ORDER — POLYETHYLENE GLYCOL 3350 17 G PO PACK
17.0000 g | PACK | Freq: Every day | ORAL | Status: DC
  Filled 2024-02-07: qty 1

## 2024-02-07 MED ORDER — SODIUM CHLORIDE 0.9 % IV SOLN
250.0000 mg | Freq: Every day | INTRAVENOUS | Status: AC
  Administered 2024-02-07 – 2024-02-09 (×3): 250 mg via INTRAVENOUS
  Filled 2024-02-07 (×3): qty 20

## 2024-02-07 MED ORDER — POLYETHYLENE GLYCOL 3350 17 G PO PACK
17.0000 g | PACK | Freq: Once | ORAL | Status: AC
  Administered 2024-02-07: 17 g via ORAL

## 2024-02-07 MED ORDER — ROMIPLOSTIM 250 MCG ~~LOC~~ SOLR
2.0000 ug/kg | Freq: Once | SUBCUTANEOUS | Status: AC
  Administered 2024-02-07: 165 ug via SUBCUTANEOUS
  Filled 2024-02-07: qty 0.33

## 2024-02-07 MED ORDER — SODIUM CHLORIDE 0.9% IV SOLUTION: Freq: Once | INTRAVENOUS | Status: AC

## 2024-02-07 NOTE — TOC Progression Note (Signed)
 Transition of Care [facility] - Progression Note   Patient Details  Name: Lonnie Hansen MRN: [id number] Date of Birth: 28-Sep-1950  Transition of Care [facility]) CM/SW Contact  Duwaine GORMAN Aran, LCSW Phone Number: 02/07/2024, 11:21 AM  Clinical Narrative: CSW met with patient to discuss a PCP follow up appointment and patient is agreeable to CSW scheduling one and prefers afternoons. CSW called Dr. Arnita office and left a voicemail for his RN, Landis, requesting call back to schedule an appointment.  Barriers to Discharge: Barriers Resolved  Expected Discharge Plan and Services           DME Arranged: N/A DME Agency: NA HH Arranged: PT HH Agency: [facility] Date HH Agency Contacted: 02/05/24 Time HH Agency Contacted: 1017 Representative spoke with at [facility] Agency: Prearranged at [employer] office  Social Drivers of Health (SDOH) Interventions SDOH Screenings   Food Insecurity: No Food Insecurity (02/04/2024)  Housing: Low Risk (02/04/2024)  Transportation Needs: No Transportation Needs (02/04/2024)  Utilities: Not At Risk (02/04/2024)  Depression (PHQ2-9): Low Risk (01/17/2024)  Social Connections: Socially Integrated (02/04/2024)  Tobacco Use: Medium Risk (02/04/2024)   Readmission Risk Interventions    10/09/2022   12:47 PM 08/19/2021   11:21 AM  Readmission Risk Prevention Plan  Post Dischage Appt Complete Complete  Medication Screening Complete Complete  Transportation Screening Complete Complete

## 2024-02-07 NOTE — Consult Note (Signed)
 Lonnie Hansen is well-known to me.  He is a very nice 74 year old African-American male.  I have been following him for several years.  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  He gets Nplate  in the office.  This is really helped with his platelets.  He had surgery, elective surgery, on 02/04/2024 for right hip osteoarthritis.  He had a anterior right hip repair.  When we saw him in the office, on 01/31/2024, his platelet counts 120,000.  On the day of surgery his platelet count was 76,000.  It subsequently dropped.  Today, his white cell count was 9.3.  Hemoglobin 7.3 platelet count 43,000.  His iron  studies are quite low today.  His iron  saturation only 10%.  I will have to give him some IV iron .   He does have an element of renal insufficiency.  His BUN is 25 creatinine 1.64.  As such, I think that Procrit  would be helpful.  He feels well.  His surgical site looks good.  He is getting some physical therapy.  He has had no nausea or vomiting.  He has had no hematuria.  He has had no bowel movement since being admitted.   His vital signs show temperature of 98.1.  Pulse 87.  Blood pressure 129/81.  His head neck exam shows no ocular or oral lesions.  He has no palpable cervical or supraclavicular lymph nodes.  His lungs sound relatively clear bilaterally.  He has good breath sounds bilaterally.  Cardiac exam regular rate and rhythm with no murmurs, rubs or bruits.  Abdomen is soft.  Bowel sounds are present.  He has no fluid wave.  There is no palpable liver or spleen tip.  Extremity shows a surgical site in the right upper thigh to be healing.  There is no ecchymoses.  There is maybe a little bit of swelling but I would think this is normal after surgery.  Neurological exam shows no focal neurological deficits.    Lonnie Hansen is a very nice 74 year old African-American male.  He has thrombocytopenia which certainly is progressive.  His platelet count was  fantastic before surgery.  He was responds well to Nplate .  I am not surprised by the anemia.  We will give him some Procrit .  He definitely needs IV iron .  I will also give him 1 unit of blood.  I think this would help him out.  We will follow along closely.  We will have to monitor his hemoglobin closely.  I know that he will get incredible care from everybody up on [address].   Jeralyn Crease, MD  Romans 15:13

## 2024-02-07 NOTE — Progress Notes (Signed)
 Physical Therapy Treatment Patient Details Name: Lonnie Hansen MRN: [id number] DOB: 12/18/1950 Today's Date: 02/07/2024   History of Present Illness Pt s/p R THR and with hx of CAD, GIB, AAA, CAD, bil carotid stenosis, Aortic valve replacement and chronic ITP    PT Comments  POD # 3 Am session withheld.  HgB 7.3 will see after completion one unit PM session Pt OOB in recliner.  Just get off BSC for a BM with Nursing.  Feeling better.  Assisted with amb in hallway then performed a few TE's following HEP handout.  Positioned to comfort and applied ICE. Pt plans to D/C to home tomorrow if medically stable (labs)  LPT has rec a RW for home.  He has a Rollator and a cane.  Pt will need a regular RW before using a Rollator due to post joint replacement instability.  Will have one PT session in am to practice stairs.     If plan is discharge home, recommend the following: A lot of help with walking and/or transfers;A lot of help with bathing/dressing/bathroom;Assistance with cooking/housework;Assist for transportation;Help with stairs or ramp for entrance   Can travel by private vehicle        Equipment Recommendations  Rolling walker (2 wheels)    Recommendations for Other Services       Precautions / Restrictions Precautions Precautions: Fall Precaution/Restrictions Comments: Thrombocytopenia/anemia Restrictions Weight Bearing Restrictions Per Provider Order: No RLE Weight Bearing Per Provider Order: Weight bearing as tolerated     Mobility  Bed Mobility               General bed mobility comments: OOB in recliner    Transfers Overall transfer level: Needs assistance Equipment used: Rolling walker (2 wheels) Transfers: Sit to/from Stand Sit to Stand: Min assist, Mod assist           General transfer comment: 50% cues for LE management and use of UEs to self assist.  Physical assist to bring wt up and fwd and to balance in initial standing with RW.  Slow  moving.  Safety with turn completion.    Ambulation/Gait Ambulation/Gait assistance: Min assist Gait Distance (Feet): 57 Feet Assistive device: Rolling walker (2 wheels) Gait Pattern/deviations: Step-to pattern, Decreased step length - right, Decreased step length - left, Shuffle, Trunk flexed Gait velocity: decr     General Gait Details: Increased time with initial difficulty advancing R LE; cues for posture, position from RW and initial sequence; slow moving   Stairs             Wheelchair Mobility     Tilt Bed    Modified Rankin (Stroke Patients Only)       Balance                                            Communication Communication Communication: No apparent difficulties  Cognition Arousal: Alert Behavior During Therapy: WFL for tasks assessed/performed   PT - Cognitive impairments: No apparent impairments                       PT - Cognition Comments: AxO x 3 pleasant with slight impaired memory/recall.  Following all commands. Following commands: Intact      Cueing Cueing Techniques: Verbal cues  Exercises  Total Hip Replacement TE's following HEP Handout 10 reps ankle pumps 05 reps  knee presses 05 reps heel slides 05 reps SAQ's 05 reps ABD Instructed how to use a belt loop to assist  Followed by ICE     General Comments        Pertinent Vitals/Pain Pain Assessment Pain Assessment: Faces Faces Pain Scale: Hurts a little bit Pain Location: R hip with activity Pain Descriptors / Indicators: Aching, Sore, Operative site guarding Pain Intervention(s): Monitored during session, Premedicated before session, Repositioned, Patient requesting pain meds-RN notified, Ice applied    Home Living                          Prior Function            PT Goals (current goals can now be found in the care plan section) Progress towards PT goals: Progressing toward goals    Frequency    7X/week      PT  Plan      Co-evaluation              AM-PAC PT 6 Clicks Mobility   Outcome Measure  Help needed turning from your back to your side while in a flat bed without using bedrails?: A Little Help needed moving from lying on your back to sitting on the side of a flat bed without using bedrails?: A Little Help needed moving to and from a bed to a chair (including a wheelchair)?: A Little Help needed standing up from a chair using your arms (e.g., wheelchair or bedside chair)?: A Little Help needed to walk in hospital room?: A Little Help needed climbing 3-5 steps with a railing? : A Lot 6 Click Score: 17    End of Session Equipment Utilized During Treatment: Gait belt Activity Tolerance: Patient limited by fatigue Patient left: in chair;with call bell/phone within reach;with chair alarm set;with family/visitor present Nurse Communication: Mobility status PT Visit Diagnosis: Difficulty in walking, not elsewhere classified (R26.2);Muscle weakness (generalized) (M62.81);Pain Pain - Right/Left: Right Pain - part of body: Hip     Time: 8542-8475 PT Time Calculation (min) (ACUTE ONLY): 27 min  Charges:    $Gait Training: 8-22 mins $Therapeutic Exercise: 8-22 mins PT General Charges $$ ACUTE PT VISIT: 1 Visit                     Katheryn Leap  PTA Acute  Rehabilitation Services Office M-F          [phone]

## 2024-02-07 NOTE — Plan of Care (Signed)
   Problem: Coping: Goal: Level of anxiety will decrease Outcome: Progressing   Problem: Pain Managment: Goal: General experience of comfort will improve and/or be controlled Outcome: Progressing   Problem: Safety: Goal: Ability to remain free from injury will improve Outcome: Progressing

## 2024-02-07 NOTE — Plan of Care (Signed)
" °  Problem: Education: Goal: Knowledge of the prescribed therapeutic regimen will improve Outcome: Progressing   Problem: Bowel/Gastric: Goal: Gastrointestinal status for postoperative course will improve Outcome: Progressing   Problem: Nutrition: Goal: Adequate nutrition will be maintained Outcome: Progressing   Problem: Pain Managment: Goal: General experience of comfort will improve and/or be controlled Outcome: Progressing   "

## 2024-02-07 NOTE — Progress Notes (Signed)
 Patient ID: Lonnie Hansen, male   DOB: 02-18-1950, 74 y.o.   MRN: [id number] The patient did let me know that Dr. Timmy had stop by this morning.  The patient's hemoglobin is back down to 7.3 with platelets of 43,000.  His vital signs are stable.  His right hip operative dressing has only scant blood and there is bruising to be expected.  His thigh is swollen but this is the normal postoperative swelling that we see after this type of surgery and I given reassurance that all that we have seen is normal and that his thrombocytopenia is a contributing factor to him being anemic as well.  We will continue to watch him until he is medically stable for discharge.  Overall though he looks pretty good except for his labs.  I certainly appreciate Dr. Jessy input and recommendations.

## 2024-02-08 DIAGNOSIS — Z952 Presence of prosthetic heart valve: Secondary | ICD-10-CM

## 2024-02-08 DIAGNOSIS — I480 Paroxysmal atrial fibrillation: Secondary | ICD-10-CM | POA: Diagnosis present

## 2024-02-08 DIAGNOSIS — M1611 Unilateral primary osteoarthritis, right hip: Secondary | ICD-10-CM | POA: Diagnosis not present

## 2024-02-08 LAB — CBC WITH DIFFERENTIAL/PLATELET
Abs Immature Granulocytes: 0.06 K/uL (ref 0.00–0.07)
Basophils Absolute: 0 K/uL (ref 0.0–0.1)
Basophils Relative: 0 %
Eosinophils Absolute: 0.2 K/uL (ref 0.0–0.5)
Eosinophils Relative: 2 %
HCT: 25.5 % — ABNORMAL LOW (ref 39.0–52.0)
Hemoglobin: 8.9 g/dL — ABNORMAL LOW (ref 13.0–17.0)
Immature Granulocytes: 1 %
Lymphocytes Relative: 11 %
Lymphs Abs: 1 K/uL (ref 0.7–4.0)
MCH: 31.6 pg (ref 26.0–34.0)
MCHC: 34.9 g/dL (ref 30.0–36.0)
MCV: 90.4 fL (ref 80.0–100.0)
Monocytes Absolute: 1.2 K/uL — ABNORMAL HIGH (ref 0.1–1.0)
Monocytes Relative: 14 %
Neutro Abs: 6.2 K/uL (ref 1.7–7.7)
Neutrophils Relative %: 72 %
Platelets: 49 K/uL — ABNORMAL LOW (ref 150–400)
RBC: 2.82 MIL/uL — ABNORMAL LOW (ref 4.22–5.81)
RDW: 15.8 % — ABNORMAL HIGH (ref 11.5–15.5)
WBC: 8.6 K/uL (ref 4.0–10.5)
nRBC: 0 % (ref 0.0–0.2)

## 2024-02-08 LAB — CBC
HCT: 24.1 % — ABNORMAL LOW (ref 39.0–52.0)
Hemoglobin: 8.3 g/dL — ABNORMAL LOW (ref 13.0–17.0)
MCH: 31.3 pg (ref 26.0–34.0)
MCHC: 34.4 g/dL (ref 30.0–36.0)
MCV: 90.9 fL (ref 80.0–100.0)
Platelets: 56 K/uL — ABNORMAL LOW (ref 150–400)
RBC: 2.65 MIL/uL — ABNORMAL LOW (ref 4.22–5.81)
RDW: 15.9 % — ABNORMAL HIGH (ref 11.5–15.5)
WBC: 8.9 K/uL (ref 4.0–10.5)
nRBC: 0.2 % (ref 0.0–0.2)

## 2024-02-08 LAB — COMPREHENSIVE METABOLIC PANEL WITH GFR
ALT: 28 U/L (ref 0–44)
AST: 60 U/L — ABNORMAL HIGH (ref 15–41)
Albumin: 3.6 g/dL (ref 3.5–5.0)
Alkaline Phosphatase: 41 U/L (ref 38–126)
Anion gap: 10 (ref 5–15)
BUN: 19 mg/dL (ref 8–23)
CO2: 26 mmol/L (ref 22–32)
Calcium: 8.7 mg/dL — ABNORMAL LOW (ref 8.9–10.3)
Chloride: 100 mmol/L (ref 98–111)
Creatinine, Ser: 1.32 mg/dL — ABNORMAL HIGH (ref 0.61–1.24)
GFR, Estimated: 57 mL/min — ABNORMAL LOW
Glucose, Bld: 107 mg/dL — ABNORMAL HIGH (ref 70–99)
Potassium: 3.7 mmol/L (ref 3.5–5.1)
Sodium: 136 mmol/L (ref 135–145)
Total Bilirubin: 1.1 mg/dL (ref 0.0–1.2)
Total Protein: 5.9 g/dL — ABNORMAL LOW (ref 6.5–8.1)

## 2024-02-08 LAB — MAGNESIUM: Magnesium: 1.9 mg/dL (ref 1.7–2.4)

## 2024-02-08 MED ORDER — AMIODARONE HCL IN DEXTROSE 360-4.14 MG/200ML-% IV SOLN
60.0000 mg/h | INTRAVENOUS | Status: DC
  Administered 2024-02-08 (×2): 60 mg/h via INTRAVENOUS
  Filled 2024-02-08 (×2): qty 200

## 2024-02-08 MED ORDER — ORAL CARE MOUTH RINSE: 15.0000 mL | OROMUCOSAL | Status: DC | PRN

## 2024-02-08 MED ORDER — DILTIAZEM HCL 30 MG PO TABS
30.0000 mg | ORAL_TABLET | Freq: Four times a day (QID) | ORAL | Status: DC
  Filled 2024-02-08: qty 1

## 2024-02-08 MED ORDER — POTASSIUM CHLORIDE CRYS ER 20 MEQ PO TBCR
40.0000 meq | EXTENDED_RELEASE_TABLET | Freq: Once | ORAL | Status: AC
  Administered 2024-02-08: 40 meq via ORAL
  Filled 2024-02-08: qty 2

## 2024-02-08 MED ORDER — AMIODARONE LOAD VIA INFUSION
150.0000 mg | Freq: Once | INTRAVENOUS | Status: AC
  Administered 2024-02-08: 150 mg via INTRAVENOUS
  Filled 2024-02-08: qty 83.34

## 2024-02-08 MED ORDER — MAGNESIUM SULFATE IN D5W 1-5 GM/100ML-% IV SOLN
1.0000 g | Freq: Once | INTRAVENOUS | Status: AC
  Administered 2024-02-08: 1 g via INTRAVENOUS
  Filled 2024-02-08: qty 100

## 2024-02-08 MED ORDER — CHLORHEXIDINE GLUCONATE CLOTH 2 % EX PADS
6.0000 | MEDICATED_PAD | Freq: Every day | CUTANEOUS | Status: DC
  Administered 2024-02-08 – 2024-02-09 (×2): 6 via TOPICAL

## 2024-02-08 MED ORDER — METHOCARBAMOL 500 MG PO TABS: 500.0000 mg | ORAL_TABLET | Freq: Four times a day (QID) | ORAL | 1 refills | Status: AC | PRN

## 2024-02-08 MED ORDER — AMIODARONE HCL IN DEXTROSE 360-4.14 MG/200ML-% IV SOLN
30.0000 mg/h | INTRAVENOUS | Status: DC
  Administered 2024-02-08 – 2024-02-09 (×2): 30 mg/h via INTRAVENOUS
  Filled 2024-02-08: qty 200

## 2024-02-08 MED ORDER — OXYCODONE HCL 5 MG PO TABS: 5.0000 mg | ORAL_TABLET | Freq: Four times a day (QID) | ORAL | 0 refills | Status: AC | PRN

## 2024-02-08 MED ORDER — SODIUM CHLORIDE 0.9 % IV SOLN: Freq: Once | INTRAVENOUS | Status: AC

## 2024-02-08 NOTE — Consult Note (Signed)
 Initial Consultation Note   Patient: Lonnie Hansen FMW:[id number] DOB: 1950/05/15 PCP: Yolande Toribio MATSU, MD DOA: 02/04/2024 DOS: the patient was seen and examined on 02/08/2024 Primary service: [name]  Referring physician:  Reason for consult:   Assessment and Plan: Principal Problem:   Unilateral primary osteoarthritis, right hip   Status post total replacement of right hip Continue postop management per primary team.  Active Problems:   PAF (paroxysmal atrial fibrillation) (HCC) Continue atenolol  12.5 mg p.o. twice daily. Seen by cardiology. Anticoagulation very high risk at this time.   Diltiazem  30 mg I p.o. every 6 hours. IV bolus amiodarone  150 mg with 60 mg/hr  - Then 12 hours then 30 mg/hr.     Chronic ITP (idiopathic thrombocytopenia) (HCC) Monitor platelet count. Hematology has been following.    HTN (hypertension) Continue amlodipine  10 mg p.o. daily. Continue atenolol  12.5 mg p.o. twice daily. Continue Hyzaar 100-12.5 mg p.o. daily. On oral diltiazem  and as above.    Hyperlipidemia Continue rosuvastatin  20 mg p.o. daily.    Glaucoma Continue Xalatan  drops.    Coronary artery disease Continue statin and beta-blocker. Aspirin  has been discontinued given ITP.    GERD Continue pantoprazole  40 mg p.o. twice daily.    TRH will continue to follow the patient.  HPI: Lonnie Hansen is a 74 y.o. male with past medical history of AAA, BPH, GERD, glaucoma, headaches, colon bilateral cataracts, chronic ITP, hypertension, bilateral carotid artery stenosis, CAD, history of stent placement x 2, enlarged aorta, history of distant AVR with root replacement due to aortic regurgitation who underwent a right THA 4 days ago who we are seeing in due to atrial fibrillation with RVR.  No chest pain, palpitations, diaphoresis, PND, orthopnea or pitting edema of the lower extremities.He denied fever, chills, rhinorrhea, sore throat, wheezing or hemoptysis.   No  abdominal pain, nausea, emesis, diarrhea, constipation, melena or hematochezia.  No flank pain, dysuria, frequency or hematuria.  No polyuria, polydipsia, polyphagia or blurred vision.   Review of Systems: As mentioned in the history of present illness. All other systems reviewed and are negative. Past Medical History:  Diagnosis Date   AAA (abdominal aortic aneurysm)    Thoraic   Anemia    Arthritis    Asymptomatic bilateral carotid artery stenosis 08/2015   1-39%    Cataracts, bilateral    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   Coronary artery disease    Stents x2   Diverticulosis    Enlarged aorta    Enlarged prostate    slightly   GERD (gastroesophageal reflux disease)    takes Pantoprazole  daily as needed   Glaucoma    uses eye drops daily   Headache    History of colon polyps    benign   History of kidney stones    Hyperlipidemia    no on any meds   Hypertension    takes Amlodipine  and Atenolol  daily   Idiopathic thrombocytopenia (HCC)    OSA on CPAP    Vocal cord nodule    pt. states  it's a growth on vocal cord   Past Surgical History:  Procedure Laterality Date   ABDOMINAL AORTIC ENDOVASCULAR STENT GRAFT N/A 11/28/2021   Procedure: ABDOMINAL AORTIC ENDOVASCULAR STENT GRAFT;  Surgeon: Sheree Penne Lonni, MD;  Location: [facility] OR;  Service: Vascular;  Laterality: N/A;   ABDOMINAL AORTOGRAM N/A 10/05/2022   Procedure: ABDOMINAL AORTOGRAM;  Surgeon: Sheree Penne Lonni, MD;  Location: [facility] INVASIVE CV LAB;  Service: Cardiovascular;  Laterality: N/A;   ANEURYSM COILING Right 11/28/2021   Procedure: ANEURYSM COILING RIGHT INTERNAL ILIAC;  Surgeon: Sheree Penne Bruckner, MD;  Location: [facility] OR;  Service: Vascular;  Laterality: Right;   AORTIC ARCH ANGIOGRAPHY N/A 04/16/2016   Procedure: Aortic Arch Angiography;  Surgeon: [name] Jordan, MD;  Location: [facility] INVASIVE CV LAB;  Service: Cardiovascular;  Laterality: N/A;   AORTIC VALVE  REPLACEMENT N/A 09/15/2016   Procedure: AORTIC VALVE REPLACEMENT (AVR);  Surgeon: Army Dallas NOVAK, MD;  Location: [facility] OR;  Service: Open Heart Surgery;  Laterality: N/A;  Using 29mm Edwards Perimount Magna Ease Aortic Bioprosthesis Valve   ASCENDING AORTIC ROOT REPLACEMENT N/A 09/15/2016   Procedure: ASCENDING AORTIC ROOT REPLACEMENT;  Surgeon: Army Dallas NOVAK, MD;  Location: [facility] OR;  Service: Open Heart Surgery;  Laterality: N/A;  Using 32mm Gelweave Valsalva Graft   CHOLECYSTECTOMY N/A 12/19/2020   Procedure: LAPAROSCOPIC CHOLECYSTECTOMY WITH POSSIBLE INTRAOPERATIVE CHOLANGIOGRAM;  Surgeon: Tanda Locus, MD;  Location: [facility] OR;  Service: General;  Laterality: N/A;   COLONOSCOPY     COLONOSCOPY Left 04/16/2019   Procedure: COLONOSCOPY;  Surgeon: Burnette Fallow, MD;  Location: [facility] ENDOSCOPY;  Service: Endoscopy;  Laterality: Left;   COLONOSCOPY N/A 06/01/2020   Procedure: COLONOSCOPY;  Surgeon: Saintclair Jasper, MD;  Location: [facility] ENDOSCOPY;  Service: Gastroenterology;  Laterality: N/A;   COLONOSCOPY WITH ESOPHAGOGASTRODUODENOSCOPY (EGD)     COLONOSCOPY WITH PROPOFOL  N/A 05/14/2021   Procedure: COLONOSCOPY WITH PROPOFOL ;  Surgeon: Saintclair Jasper, MD;  Location: [facility] ENDOSCOPY;  Service: Gastroenterology;  Laterality: N/A;   ERCP N/A 08/19/2021   Procedure: ENDOSCOPIC RETROGRADE CHOLANGIOPANCREATOGRAPHY (ERCP);  Surgeon: Rosalie Kitchens, MD;  Location: [facility];  Service: Gastroenterology;  Laterality: N/A;  at 1:30 pm   ESOPHAGOGASTRODUODENOSCOPY N/A 05/25/2020   Procedure: ESOPHAGOGASTRODUODENOSCOPY (EGD);  Surgeon: Dianna Specking, MD;  Location: [facility] ENDOSCOPY;  Service: Endoscopy;  Laterality: N/A;   ESOPHAGOGASTRODUODENOSCOPY N/A 08/19/2021   Procedure: ESOPHAGOGASTRODUODENOSCOPY (EGD);  Surgeon: Elicia Claw, MD;  Location: [facility];  Service: Gastroenterology;  Laterality: N/A;   ESOPHAGOGASTRODUODENOSCOPY (EGD) WITH PROPOFOL  Left 04/16/2019   Procedure: ESOPHAGOGASTRODUODENOSCOPY (EGD)  WITH PROPOFOL ;  Surgeon: Burnette Fallow, MD;  Location: [facility] ENDOSCOPY;  Service: Endoscopy;  Laterality: Left;   ESOPHAGOGASTRODUODENOSCOPY (EGD) WITH PROPOFOL  N/A 08/23/2021   Procedure: ESOPHAGOGASTRODUODENOSCOPY (EGD) WITH PROPOFOL ;  Surgeon: Saintclair Jasper, MD;  Location: [facility] ENDOSCOPY;  Service: Gastroenterology;  Laterality: N/A;   EYE SURGERY Bilateral    cataract with lens implants   FLEXIBLE SIGMOIDOSCOPY N/A 05/25/2020   Procedure: FLEXIBLE SIGMOIDOSCOPY;  Surgeon: Dianna Specking, MD;  Location: [facility] ENDOSCOPY;  Service: Endoscopy;  Laterality: N/A;   HEMOSTASIS CONTROL  08/19/2021   Procedure: HEMOSTASIS CONTROL;  Surgeon: Elicia Claw, MD;  Location: [facility] ENDOSCOPY;  Service: Gastroenterology;;   IR ANGIOGRAM SELECTIVE EACH ADDITIONAL VESSEL  05/26/2021   IR ANGIOGRAM SELECTIVE EACH ADDITIONAL VESSEL  08/20/2021   IR ANGIOGRAM SELECTIVE EACH ADDITIONAL VESSEL  08/20/2021   IR ANGIOGRAM VISCERAL SELECTIVE  05/26/2021   IR ANGIOGRAM VISCERAL SELECTIVE  08/20/2021   IR EMBO ART  VEN HEMORR LYMPH EXTRAV  INC GUIDE ROADMAPPING  05/26/2021   IR EMBO ART  VEN HEMORR LYMPH EXTRAV  INC GUIDE ROADMAPPING  08/20/2021   IR FLUORO GUIDE CV LINE RIGHT  08/20/2021   IR THORACENTESIS RIGHT ASP PLEURAL SPACE W/IMG GUIDE  10/23/2016   IR US  GUIDE VASC ACCESS LEFT  05/26/2021   IR US  GUIDE VASC ACCESS RIGHT  05/26/2021   IR US  GUIDE VASC ACCESS RIGHT  08/20/2021  IR US  GUIDE VASC ACCESS RIGHT  08/20/2021   MICROLARYNGOSCOPY Right 09/20/2017   Procedure: MICROLARYNGOSCOPY WITH  EXCISION OF VOCAL CORD LESION;  Surgeon: Karis Clunes, MD;  Location:  SURGERY CENTER;  Service: ENT;  Laterality: Right;   MULTIPLE EXTRACTIONS WITH ALVEOLOPLASTY N/A 06/10/2016   Procedure: Extraction of tooth #'s 2,8,13,15, and 29  with alveoloplasty, maxillary right and left buccal exostoses reductions, and gross debridement of remaining teeth.;  Surgeon: Cyndee Tanda FALCON, DDS;  Location: [facility] OR;  Service: Oral  Surgery;  Laterality: N/A;   POLYPECTOMY  05/14/2021   Procedure: POLYPECTOMY;  Surgeon: Saintclair Jasper, MD;  Location: [facility] ENDOSCOPY;  Service: Gastroenterology;;   REMOVAL OF STONES  08/19/2021   Procedure: REMOVAL OF STONES;  Surgeon: Rosalie Kitchens, MD;  Location: [facility] ENDOSCOPY;  Service: Gastroenterology;;   RIGHT/LEFT HEART CATH AND CORONARY ANGIOGRAPHY N/A 04/16/2016   Procedure: Right/Left Heart Cath and Coronary Angiography;  Surgeon: [name] Jordan, MD;  Location: [facility] INVASIVE CV LAB;  Service: Cardiovascular;  Laterality: N/A;   SPHINCTEROTOMY  08/19/2021   Procedure: SPHINCTEROTOMY;  Surgeon: Rosalie Kitchens, MD;  Location: [facility] ENDOSCOPY;  Service: Gastroenterology;;   TEE WITHOUT CARDIOVERSION N/A 09/15/2016   Procedure: TRANSESOPHAGEAL ECHOCARDIOGRAM (TEE);  Surgeon: Army Dallas NOVAK, MD;  Location: [facility] OR;  Service: Open Heart Surgery;  Laterality: N/A;   Social History:  reports that he has quit smoking. His smoking use included cigarettes. He has a 25 pack-year smoking history. He has never used smokeless tobacco. He reports that he does not drink alcohol  and does not use drugs.  Allergies[1]  Family History  Problem Relation Age of Onset   Lymphoma Mother    Heart disease Father    Heart attack Father    Thyroid  disease Sister     Prior to Admission medications  Medication Sig Start Date End Date Taking? Authorizing Provider  amLODipine  (NORVASC ) 10 MG tablet Take 10 mg by mouth daily. 11/12/22  Yes [provider]  atenolol  (TENORMIN ) 25 MG tablet Take 12.5 mg by mouth 2 (two) times daily. 08/26/20  Yes Anner Alm ORN, MD  fluticasone  (FLONASE ) 50 MCG/ACT nasal spray Place 1 spray into both nostrils daily as needed (congestion). 02/18/23  Yes [provider]  hydrocortisone  2.5 % cream Apply 1 Application topically daily as needed (itching). 11/19/23  Yes [provider]  latanoprost  (XALATAN ) 0.005 % ophthalmic solution Place 1 drop into both eyes in the  morning and at bedtime.   Yes [provider]  losartan -hydrochlorothiazide  (HYZAAR) 100-12.5 MG tablet Take 0.5 tablets by mouth daily.   Yes [provider]  Multiple Vitamin (MULTIVITAMIN WITH MINERALS) TABS tablet Take 1 tablet by mouth daily.   Yes [provider]  NON FORMULARY Pt uses a cpap nightly   Yes [provider]  Nutritional Supplements (ENSURE PLUS PO) Take 1 Bottle by mouth 3 (three) times daily with meals.   Yes [provider]  pantoprazole  (PROTONIX ) 40 MG tablet Take 40 mg by mouth 2 (two) times daily. 11/09/20  Yes [provider]  rosuvastatin  (CRESTOR ) 20 MG tablet Take 20 mg by mouth daily.   Yes [provider]  tamsulosin  (FLOMAX ) 0.4 MG CAPS capsule Take 0.4 mg by mouth at bedtime. 12/09/22  Yes [provider]  methocarbamol  (ROBAXIN ) 500 MG tablet Take 1 tablet (500 mg total) by mouth every 6 (six) hours as needed for muscle spasms. 02/08/24   Vernetta Lonni GRADE, MD  oxyCODONE  (OXY IR/ROXICODONE ) 5 MG immediate  release tablet Take 1-2 tablets (5-10 mg total) by mouth every 6 (six) hours as needed for moderate pain (pain score 4-6) (pain score 4-6). No more than 6 tablets daily. 02/08/24   Vernetta Lonni GRADE, MD    Physical Exam: Vitals:   02/08/24 1426 02/08/24 1427 02/08/24 1435 02/08/24 1458  BP: 111/76  100/63 (!) 105/90  Pulse: (!) 130 (!) 155 (!) 131 (!) 150  Resp: 18  18 16   Temp:      TempSrc:      SpO2: 96%  95% 95%  Weight:      Height:       Physical Exam Vitals and nursing note reviewed.  Constitutional:      General: He is awake. He is not in acute distress.    Appearance: Normal appearance. He is ill-appearing.  HENT:     Head: Normocephalic.     Nose: No rhinorrhea.     Mouth/Throat:     Mouth: Mucous membranes are moist.  Eyes:     General: No scleral icterus.    Pupils: Pupils are equal, round, and reactive to light.  Neck:     Vascular: No JVD.   Cardiovascular:     Rate and Rhythm: Tachycardia present. Rhythm irregular.     Heart sounds: S1 normal and S2 normal.  Pulmonary:     Effort: Pulmonary effort is normal.     Breath sounds: Normal breath sounds. No wheezing, rhonchi or rales.  Abdominal:     General: Bowel sounds are normal. There is no distension.     Palpations: Abdomen is soft.     Tenderness: There is no abdominal tenderness. There is no right CVA tenderness or left CVA tenderness.  Musculoskeletal:     Cervical back: Neck supple.     Right lower leg: No edema.     Left lower leg: No edema.  Skin:    General: Skin is warm and dry.  Neurological:     General: No focal deficit present.     Mental Status: He is alert and oriented to person, place, and time.  Psychiatric:        Mood and Affect: Mood normal.        Behavior: Behavior normal. Behavior is cooperative.     Data Reviewed:   Results are pending, will review when available.  Family Communication:  Primary team communication:  Thank you very much for involving us  in the care of your patient.  Author: Alm Dorn Castor, MD 02/08/2024 3:05 PM  For on call review [email or web address].   This document was prepared using Dragon voice recognition software and may contain some unintended transcription errors.     [1] No Known Allergies

## 2024-02-08 NOTE — Consult Note (Addendum)
 CARDIOLOGY CONSULT NOTE       Patient ID: Lonnie Hansen MRN: [id number] DOB/AGE: [birth year] 74 y.o.  Admit date: 02/04/2024 Referring Physician: Vernetta Primary Physician: Yolande Toribio MATSU, MD Primary Cardiologist: Jordan Reason for Consultation: Afib  Principal Problem:   Unilateral primary osteoarthritis, right hip Active Problems:   Status post total replacement of right hip   S/P hip replacement   HPI:  74 y.o. post op right hip who went into rapid afib on d/c. 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 Echo s have shown good repair with no valve breakdown and normal EF. Had moderate non dominant RCA CAD that was not bypassed. He is currently stable with soft BP 108/78 mmHg. No chest pain dyspnea. Notes rapid heart beat. He has also had EVAR for iliac / aortic aneurysm followed by Dr Sheree. Prior monitor shows he tends to bradycardia with average HR in low 60's when in sinus. 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 Married 29 years Wife in room and part of discussion  ROS All other systems reviewed and negative except as noted above  Past Medical History:  Diagnosis Date   AAA (abdominal aortic aneurysm)    Thoraic   Anemia    Arthritis    Asymptomatic bilateral carotid artery stenosis 08/2015   1-39%    Cataracts, bilateral    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   Coronary artery disease    Stents x2   Diverticulosis    Enlarged aorta    Enlarged prostate    slightly   GERD (gastroesophageal reflux disease)    takes Pantoprazole  daily as needed   Glaucoma    uses eye drops daily   Headache    History of colon polyps    benign    History of kidney stones    Hyperlipidemia    no on any meds   Hypertension    takes Amlodipine  and Atenolol  daily   Idiopathic thrombocytopenia (HCC)    OSA on CPAP    Vocal cord nodule    pt. states  it's a growth on vocal cord    Family History  Problem Relation Age of Onset   Lymphoma Mother    Heart disease Father    Heart attack Father    Thyroid  disease Sister     Social History   Socioeconomic History   Marital status: Married    Spouse name: Not on file   Number of children: 1   Years of education: Not on file   Highest education level: Not on file  Occupational History   Occupation: Musician  Tobacco Use   Smoking status: Former    Current packs/day: 1.00    Average packs/day: 1 pack/day for 25.0 years (25.0 ttl pk-yrs)    Types: Cigarettes   Smokeless tobacco: Never   Tobacco comments:    Quit 2005  Vaping Use   Vaping status: Never Used  Substance and Sexual Activity   Alcohol  use: No    Alcohol /week: 0.0 standard drinks of alcohol    Drug use: No   Sexual activity: Not Currently  Other Topics Concern   Not on file  Social History Narrative  Married.   He is a theme park manager.   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   Social Drivers of Health   Tobacco Use: Medium Risk (02/04/2024)   Patient History    Smoking Tobacco Use: Former    Smokeless Tobacco Use: Never    Passive Exposure: Not on Actuary Strain: Not on file  Food Insecurity: No Food Insecurity (02/04/2024)   Epic    Worried About Programme Researcher, Broadcasting/film/video in the Last Year: Never true    Ran Out of Food in the Last Year: Never true  Transportation Needs: No Transportation Needs (02/04/2024)   Epic    Lack of Transportation (Medical): No    Lack of Transportation (Non-Medical): No  Physical Activity: Not on file  Stress: Not on file  Social Connections: Socially Integrated (02/04/2024)   Social Connection and Isolation Panel    Frequency of Communication with Friends and Family: Three times a week     Frequency of Social Gatherings with Friends and Family: Twice a week    Attends Religious Services: More than 4 times per year    Active Member of Clubs or Organizations: Yes    Attends Banker Meetings: More than 4 times per year    Marital Status: Married  Catering Manager Violence: Not At Risk (02/04/2024)   Epic    Fear of Current or Ex-Partner: No    Emotionally Abused: No    Physically Abused: No    Sexually Abused: No  Depression (PHQ2-9): Low Risk (01/17/2024)   Depression (PHQ2-9)    PHQ-2 Score: 0  Alcohol  Screen: Not on file  Housing: Low Risk (02/04/2024)   Epic    Unable to Pay for Housing in the Last Year: No    Number of Times Moved in the Last Year: 0    Homeless in the Last Year: No  Utilities: Not At Risk (02/04/2024)   Epic    Threatened with loss of utilities: No  Health Literacy: Not on file    Past Surgical History:  Procedure Laterality Date   ABDOMINAL AORTIC ENDOVASCULAR STENT GRAFT N/A 11/28/2021   Procedure: ABDOMINAL AORTIC ENDOVASCULAR STENT GRAFT;  Surgeon: Sheree Penne Bruckner, MD;  Location: [facility] OR;  Service: Vascular;  Laterality: N/A;   ABDOMINAL AORTOGRAM N/A 10/05/2022   Procedure: ABDOMINAL AORTOGRAM;  Surgeon: Sheree Penne Bruckner, MD;  Location: [facility] INVASIVE CV LAB;  Service: Cardiovascular;  Laterality: N/A;   ANEURYSM COILING Right 11/28/2021   Procedure: ANEURYSM COILING RIGHT INTERNAL ILIAC;  Surgeon: Sheree Penne Bruckner, MD;  Location: [facility] OR;  Service: Vascular;  Laterality: Right;   AORTIC ARCH ANGIOGRAPHY N/A 04/16/2016   Procedure: Aortic Arch Angiography;  Surgeon: Akira Adelsberger M Jordan, MD;  Location: [facility] INVASIVE CV LAB;  Service: Cardiovascular;  Laterality: N/A;   AORTIC VALVE REPLACEMENT N/A 09/15/2016   Procedure: AORTIC VALVE REPLACEMENT (AVR);  Surgeon: Army Dallas NOVAK, MD;  Location: [facility] OR;  Service: Open Heart Surgery;  Laterality: N/A;  Using 29mm Edwards Perimount Magna Ease Aortic Bioprosthesis Valve    ASCENDING AORTIC ROOT REPLACEMENT N/A 09/15/2016   Procedure: ASCENDING AORTIC ROOT REPLACEMENT;  Surgeon: Army Dallas NOVAK, MD;  Location: [facility] OR;  Service: Open Heart Surgery;  Laterality: N/A;  Using 32mm Gelweave Valsalva Graft   CHOLECYSTECTOMY N/A 12/19/2020   Procedure: LAPAROSCOPIC CHOLECYSTECTOMY WITH POSSIBLE INTRAOPERATIVE CHOLANGIOGRAM;  Surgeon: Tanda Locus, MD;  Location: [facility] OR;  Service: General;  Laterality: N/A;   COLONOSCOPY     COLONOSCOPY Left 04/16/2019  Procedure: COLONOSCOPY;  Surgeon: Burnette Fallow, MD;  Location: [facility] ENDOSCOPY;  Service: Endoscopy;  Laterality: Left;   COLONOSCOPY N/A 06/01/2020   Procedure: COLONOSCOPY;  Surgeon: Saintclair Jasper, MD;  Location: [facility] ENDOSCOPY;  Service: Gastroenterology;  Laterality: N/A;   COLONOSCOPY WITH ESOPHAGOGASTRODUODENOSCOPY (EGD)     COLONOSCOPY WITH PROPOFOL  N/A 05/14/2021   Procedure: COLONOSCOPY WITH PROPOFOL ;  Surgeon: Saintclair Jasper, MD;  Location: [facility] ENDOSCOPY;  Service: Gastroenterology;  Laterality: N/A;   ERCP N/A 08/19/2021   Procedure: ENDOSCOPIC RETROGRADE CHOLANGIOPANCREATOGRAPHY (ERCP);  Surgeon: Rosalie Kitchens, MD;  Location: [facility];  Service: Gastroenterology;  Laterality: N/A;  at 1:30 pm   ESOPHAGOGASTRODUODENOSCOPY N/A 05/25/2020   Procedure: ESOPHAGOGASTRODUODENOSCOPY (EGD);  Surgeon: Dianna Specking, MD;  Location: [facility] ENDOSCOPY;  Service: Endoscopy;  Laterality: N/A;   ESOPHAGOGASTRODUODENOSCOPY N/A 08/19/2021   Procedure: ESOPHAGOGASTRODUODENOSCOPY (EGD);  Surgeon: Elicia Claw, MD;  Location: [facility];  Service: Gastroenterology;  Laterality: N/A;   ESOPHAGOGASTRODUODENOSCOPY (EGD) WITH PROPOFOL  Left 04/16/2019   Procedure: ESOPHAGOGASTRODUODENOSCOPY (EGD) WITH PROPOFOL ;  Surgeon: Burnette Fallow, MD;  Location: [facility] ENDOSCOPY;  Service: Endoscopy;  Laterality: Left;   ESOPHAGOGASTRODUODENOSCOPY (EGD) WITH PROPOFOL  N/A 08/23/2021   Procedure: ESOPHAGOGASTRODUODENOSCOPY (EGD) WITH PROPOFOL ;   Surgeon: Saintclair Jasper, MD;  Location: [facility] ENDOSCOPY;  Service: Gastroenterology;  Laterality: N/A;   EYE SURGERY Bilateral    cataract with lens implants   FLEXIBLE SIGMOIDOSCOPY N/A 05/25/2020   Procedure: FLEXIBLE SIGMOIDOSCOPY;  Surgeon: Dianna Specking, MD;  Location: [facility] ENDOSCOPY;  Service: Endoscopy;  Laterality: N/A;   HEMOSTASIS CONTROL  08/19/2021   Procedure: HEMOSTASIS CONTROL;  Surgeon: Elicia Claw, MD;  Location: [facility] ENDOSCOPY;  Service: Gastroenterology;;   IR ANGIOGRAM SELECTIVE EACH ADDITIONAL VESSEL  05/26/2021   IR ANGIOGRAM SELECTIVE EACH ADDITIONAL VESSEL  08/20/2021   IR ANGIOGRAM SELECTIVE EACH ADDITIONAL VESSEL  08/20/2021   IR ANGIOGRAM VISCERAL SELECTIVE  05/26/2021   IR ANGIOGRAM VISCERAL SELECTIVE  08/20/2021   IR EMBO ART  VEN HEMORR LYMPH EXTRAV  INC GUIDE ROADMAPPING  05/26/2021   IR EMBO ART  VEN HEMORR LYMPH EXTRAV  INC GUIDE ROADMAPPING  08/20/2021   IR FLUORO GUIDE CV LINE RIGHT  08/20/2021   IR THORACENTESIS RIGHT ASP PLEURAL SPACE W/IMG GUIDE  10/23/2016   IR US  GUIDE VASC ACCESS LEFT  05/26/2021   IR US  GUIDE VASC ACCESS RIGHT  05/26/2021   IR US  GUIDE VASC ACCESS RIGHT  08/20/2021   IR US  GUIDE VASC ACCESS RIGHT  08/20/2021   MICROLARYNGOSCOPY Right 09/20/2017   Procedure: MICROLARYNGOSCOPY WITH  EXCISION OF VOCAL CORD LESION;  Surgeon: Karis Clunes, MD;  Location: [facility];  Service: ENT;  Laterality: Right;   MULTIPLE EXTRACTIONS WITH ALVEOLOPLASTY N/A 06/10/2016   Procedure: Extraction of tooth #'s 2,8,13,15, and 29  with alveoloplasty, maxillary right and left buccal exostoses reductions, and gross debridement of remaining teeth.;  Surgeon: Cyndee Tanda FALCON, DDS;  Location: [facility] OR;  Service: Oral Surgery;  Laterality: N/A;   POLYPECTOMY  05/14/2021   Procedure: POLYPECTOMY;  Surgeon: Saintclair Jasper, MD;  Location: [facility] ENDOSCOPY;  Service: Gastroenterology;;   REMOVAL OF STONES  08/19/2021   Procedure: REMOVAL OF STONES;  Surgeon:  Rosalie Kitchens, MD;  Location: [facility] ENDOSCOPY;  Service: Gastroenterology;;   RIGHT/LEFT HEART CATH AND CORONARY ANGIOGRAPHY N/A 04/16/2016   Procedure: Right/Left Heart Cath and Coronary Angiography;  Surgeon: Kordell Jafri M Jordan, MD;  Location: [facility] INVASIVE CV LAB;  Service: Cardiovascular;  Laterality: N/A;   SPHINCTEROTOMY  08/19/2021   Procedure: SPHINCTEROTOMY;  Surgeon: Rosalie,  Oliva, MD;  Location: [facility];  Service: Gastroenterology;;   TEE WITHOUT CARDIOVERSION N/A 09/15/2016   Procedure: TRANSESOPHAGEAL ECHOCARDIOGRAM (TEE);  Surgeon: Army Dallas NOVAK, MD;  Location: [facility] OR;  Service: Open Heart Surgery;  Laterality: N/A;     Current Medications[1]  amiodarone   150 mg Intravenous Once   amLODipine   10 mg Oral Daily   diltiazem   30 mg Oral Q6H   docusate sodium   100 mg Oral BID   latanoprost   1 drop Both Eyes BID   multivitamin with minerals  1 tablet Oral Daily   pantoprazole   40 mg Oral BID   rosuvastatin   20 mg Oral Daily   tamsulosin   0.4 mg Oral QHS    sodium chloride  Stopped (02/05/24 1653)   sodium chloride      amiodarone      Followed by   amiodarone      ferric gluconate (FERRLECIT) IVPB 250 mg (02/08/24 1007)    Physical Exam: Blood pressure 100/63, pulse (!) 131, temperature 99.5 F (37.5 C), temperature source Oral, resp. rate 18, height 5' 9 (1.753 m), weight 82.4 kg, SpO2 95%.    Black male in no distress Poor dentition Lungs clear SEM through AVR no AR Abdomen benign New Right THR with dry dressing in place No edema   Labs:   Lab Results  Component Value Date   WBC 8.6 02/08/2024   HGB 8.9 (L) 02/08/2024   HCT 25.5 (L) 02/08/2024   MCV 90.4 02/08/2024   PLT 49 (L) 02/08/2024    Recent Labs  Lab 02/08/24 0317  NA 136  K 3.7  CL 100  CO2 26  BUN 19  CREATININE 1.32*  CALCIUM  8.7*  PROT 5.9*  BILITOT 1.1  ALKPHOS 41  ALT 28  AST 60*  GLUCOSE 107*   Lab Results  Component Value Date   CKTOTAL 204 04/28/2010   CKMB 1.7 04/28/2010    TROPONINI <0.03 10/14/2016    Lab Results  Component Value Date   CHOL 157 05/11/2019   CHOL 164 08/17/2018   CHOL 109 11/11/2017   Lab Results  Component Value Date   HDL 32 (L) 05/11/2019   HDL 35 (L) 08/17/2018   HDL 35 (L) 11/11/2017   Lab Results  Component Value Date   LDLCALC 81 05/11/2019   LDLCALC 75 08/17/2018   LDLCALC 47 11/11/2017   Lab Results  Component Value Date   TRIG 158 (H) 10/06/2022   TRIG 758 (H) 08/20/2021   TRIG 201 (H) 05/26/2020   Lab Results  Component Value Date   CHOLHDL 4.9 05/11/2019   CHOLHDL 4.7 08/17/2018   CHOLHDL 4.0 10/07/2016   No results found for: LDLDIRECT    Radiology: DG Pelvis Portable Result Date: 02/04/2024 CLINICAL DATA:  Status post right hip replacement. EXAM: PORTABLE PELVIS 1-2 VIEWS COMPARISON:  Radiographs 08/16/2023 FINDINGS: Right hip arthroplasty in expected alignment. No periprosthetic lucency or fracture. Recent postsurgical change includes air and edema in the soft tissues. Overlying skin staples in place. Probable vascular closure device projects over the medial hip, unchanged from preoperative exam. Vascular coils and stents in the pelvis. Probable external artifact projecting over the central pelvis. IMPRESSION: Right hip arthroplasty without immediate postoperative complication. Electronically Signed   By: Andrea Gasman M.D.   On: 02/04/2024 15:06   DG HIP UNILAT WITH PELVIS 1V RIGHT Result Date: 02/04/2024 CLINICAL DATA:  Elective surgery. EXAM: DG HIP (WITH OR WITHOUT PELVIS) 1V RIGHT COMPARISON:  Preoperative imaging FINDINGS: Nine fluoroscopic spot views  of the pelvis and right hip obtained in the operating room. Sequential images during hip arthroplasty. Fluoroscopy time 19 seconds. Dose 2.5 mGy. IMPRESSION: Intraoperative fluoroscopy during right hip arthroplasty. Electronically Signed   By: Andrea Gasman M.D.   On: 02/04/2024 15:05   DG C-Arm 1-60 Min-No Report Result Date: 02/04/2024 Fluoroscopy was  utilized by the requesting physician.  No radiographic interpretation.   CT ANGIO CHEST AORTA W/CM & OR WO/CM Result Date: 01/12/2024 CLINICAL DATA:  Thoracic aortic aneurysm surveillance. EXAM: CT ANGIOGRAPHY CHEST WITH CONTRAST TECHNIQUE: Multidetector CT imaging of the chest was performed using the standard protocol during bolus administration of intravenous contrast. Multiplanar CT image reconstructions and MIPs were obtained to evaluate the vascular anatomy. RADIATION DOSE REDUCTION: This exam was performed according to the departmental dose-optimization program which includes automated exposure control, adjustment of the mA and/or kV according to patient size and/or use of iterative reconstruction technique. CONTRAST:  OMNIPAQUE  IOHEXOL  350 MG/ML SOLN COMPARISON:  07/14/2023, 12/21/2022, 11/03/2022 FINDINGS: Cardiovascular: Median sternotomy wires are present. Heart is normal size. Calcified plaque over the left main, left anterior descending and lateral circumflex coronary arteries. Continued evidence of aneurysm of the ascending thoracic aorta measuring 5.2 cm without significant change. 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 Aortic root at the sinuses of Valsalva measures 4.4 cm and sinotubular junction measures 3.2 cm. Aortic valve in place. Great vessel takeoff from the aortic arch unchanged. Proximal aortic arch 4.9 cm. Posterior arch 3.9 cm. Descending thoracic aorta 3.4 cm. Pulmonary arterial system and remaining vascular structures are unremarkable. Mediastinum/Nodes: No mediastinal or hilar adenopathy. Remaining mediastinal structures are normal. Lungs/Pleura: Lungs are adequately inflated without acute airspace consolidation or effusion. Mild opacification over the lingula and adjacent left lower lobe which may be due to atelectasis or infection. Airways are normal. Upper Abdomen: Previous cholecystectomy. Subcentimeter liver hypodensities unchanged and  likely cysts or hemangiomas. Cystic change of the kidneys which is stable. Moderate calcification over the left upper pole renal cortex unchanged. Partially visualized infrarenal aortic stent graft unchanged. No acute findings. Musculoskeletal: Unchanged. Review of the MIP images confirms the above findings. IMPRESSION: 1. 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 Recommend semi-annual imaging followup by CTA or MRA and referral to cardiothoracic surgery if not already obtained. This recommendation follows 2010 ACCF/AHA/AATS/ACR/ASA/SCA/SCAI/SIR/STS/SVM Guidelines for the Diagnosis and Management of Patients With Thoracic Aortic Disease. Circulation. 2010; 121: Z733-z630. Aortic aneurysm NOS (ICD10-I71.9). 2. Mild opacification over the lingula and adjacent left lower lobe which may be due to atelectasis or infection. 3. Aortic atherosclerosis. Atherosclerotic coronary artery disease. 4. Stable subcentimeter liver hypodensities likely cysts or hemangiomas. 5. Stable cystic change of the kidneys. 6. Partially visualized infrarenal aortic stent graft unchanged. Aortic Atherosclerosis (ICD10-I70.0). Electronically Signed   By: Toribio Agreste M.D.   On: 01/12/2024 10:02    EKG: afib nonspecific ST changes    ASSESSMENT AND PLAN:    PAF: post operative. No prior documentation of such. BP soft but hemodynamically stable. Complicated in that he is not a candidate for anticoagulation with fresh TRH replacement, prior diverticular bleed, and low Hct/PLT requiring transfusion Procrit  and Nplate . Given prior d/c of atenolol  by Dr Jordan and SB when in sinus low 60's will use oral short active cardizem  30 mg PO q6 hours for rate control. Have 24-48 hours from onset to use AAT without worry about not being on anticoagulation. IV bolus amiodarone  150  mg with 60 mg/hr drip 12 hours then 30 mg/hr. Transfer to step down.  AVR  with  graft repair of aorta Last echo 2022 with normal EF and normal functioning 29 mm MagnaEase bioprosthetic valve implant date 09/15/16 mean gradient 11 mmHg no PVL or AR Hematology:  followed by Dr Timmy will message him Will be an issue if he does not convert in next 48 hours and we need to consider anticoagulation with Hct 25.5 post transfusion and Procrit  and PLT 49 post NPlat infusion.  AAA: with iliac artery aneurysm stable post EVAR f/u Dr Sheree with [facility]  Discussed care with nurse, family hospitalist and Dr Bernadine  Signed: Maude Emmer 02/08/2024, 2:58 PM      [1]  Current Facility-Administered Medications:    0.9 %  sodium chloride  infusion, , Intravenous, Continuous, Vernetta Lonni GRADE, MD, Stopped at 02/05/24 1653   0.9 %  sodium chloride  infusion, , Intravenous, Once, Chalsey Leeth C, MD   acetaminophen  (TYLENOL ) tablet 325-650 mg, 325-650 mg, Oral, Q6H PRN, Vernetta Lonni GRADE, MD, 650 mg at 02/08/24 1001   alum & mag hydroxide-simeth (MAALOX/MYLANTA) 200-200-20 MG/5ML suspension 30 mL, 30 mL, Oral, Q4H PRN, Vernetta Lonni GRADE, MD   amiodarone  (NEXTERONE  PREMIX) 360-4.14 MG/200ML-% (1.8 mg/mL) IV infusion, 60 mg/hr, Intravenous, Continuous **FOLLOWED BY** amiodarone  (NEXTERONE  PREMIX) 360-4.14 MG/200ML-% (1.8 mg/mL) IV infusion, 30 mg/hr, Intravenous, Continuous, Kirklin Mcduffee, Maude BROCKS, MD   amiodarone  (NEXTERONE ) 1.8 mg/mL load via infusion 150 mg, 150 mg, Intravenous, Once, Emmer Maude BROCKS, MD   amLODipine  (NORVASC ) tablet 10 mg, 10 mg, Oral, Daily, Vernetta Lonni GRADE, MD, 10 mg at 02/08/24 1358   diltiazem  (CARDIZEM ) tablet 30 mg, 30 mg, Oral, Q6H, Naz Denunzio C, MD   diphenhydrAMINE  (BENADRYL ) 12.5 MG/5ML elixir 12.5-25 mg, 12.5-25 mg, Oral, Q4H PRN, Vernetta Lonni GRADE, MD   docusate sodium  (COLACE) capsule 100 mg, 100 mg, Oral, BID, Vernetta Lonni GRADE, MD, 100 mg at 02/08/24 1001   ferric gluconate (FERRLECIT) 250 mg in sodium chloride  0.9 % 250 mL  IVPB, 250 mg, Intravenous, Daily, Timmy Maude SAUNDERS, MD, Last Rate: 135 mL/hr at 02/08/24 1007, 250 mg at 02/08/24 1007   HYDROmorphone  (DILAUDID ) injection 0.5-1 mg, 0.5-1 mg, Intravenous, Q4H PRN, Vernetta Lonni GRADE, MD   latanoprost  (XALATAN ) 0.005 % ophthalmic solution 1 drop, 1 drop, Both Eyes, BID, Vernetta Lonni GRADE, MD, 1 drop at 02/08/24 1000   menthol  (CEPACOL) lozenge 3 mg, 1 lozenge, Oral, PRN **OR** phenol (CHLORASEPTIC) mouth spray 1 spray, 1 spray, Mouth/Throat, PRN, Vernetta Lonni GRADE, MD   methocarbamol  (ROBAXIN ) tablet 500 mg, 500 mg, Oral, Q6H PRN, 500 mg at 02/04/24 1413 **OR** methocarbamol  (ROBAXIN ) injection 500 mg, 500 mg, Intravenous, Q6H PRN, Vernetta Lonni GRADE, MD   metoCLOPramide  (REGLAN ) tablet 5-10 mg, 5-10 mg, Oral, Q8H PRN **OR** metoCLOPramide  (REGLAN ) injection 5-10 mg, 5-10 mg, Intravenous, Q8H PRN, Vernetta Lonni GRADE, MD   multivitamin with minerals tablet 1 tablet, 1 tablet, Oral, Daily, Vernetta Lonni GRADE, MD, 1 tablet at 02/08/24 1001   ondansetron  (ZOFRAN ) tablet 4 mg, 4 mg, Oral, Q6H PRN **OR** ondansetron  (ZOFRAN ) injection 4 mg, 4 mg, Intravenous, Q6H PRN, Vernetta Lonni GRADE, MD, 4 mg at 02/04/24 2045   oxyCODONE  (Oxy IR/ROXICODONE ) immediate release tablet 10-15 mg, 10-15 mg, Oral, Q4H PRN, Vernetta Lonni GRADE, MD   oxyCODONE  (Oxy IR/ROXICODONE ) immediate release tablet 5-10 mg, 5-10 mg, Oral, Q4H PRN, Vernetta Lonni GRADE, MD, 5 mg at 02/06/24 0545   pantoprazole  (PROTONIX ) EC tablet 40 mg, 40  mg, Oral, BID, Vernetta Lonni GRADE, MD, 40 mg at 02/08/24 1001   rosuvastatin  (CRESTOR ) tablet 20 mg, 20 mg, Oral, Daily, Vernetta Lonni GRADE, MD, 20 mg at 02/08/24 1001   tamsulosin  (FLOMAX ) capsule 0.4 mg, 0.4 mg, Oral, QHS, Vernetta Lonni GRADE, MD, 0.4 mg at 02/07/24 2021

## 2024-02-08 NOTE — Evaluation (Signed)
 Occupational Therapy Evaluation Patient Details Name: Lonnie Hansen MRN: [id number] DOB: Feb 24, 1950 Today's Date: 02/08/2024   History of Present Illness   Pt s/p R THR and with hx of CAD, GIB, AAA, CAD, bil carotid stenosis, Aortic valve replacement and chronic ITP     Clinical Impressions PTA, patient lives at home with wife and was independent with BADL's and IADL's as well as amb. Currently, patient presents with deficits outlined below (see OT Problem List for details) most significantly pain, decreased activity tolerance and balance limiting BADL's and mobility. No follow up OT needs anticipated and patient reports he already has a shower chair and BSC in the home from a previous condition. Provided with written handout as a resource if need be to obtain. Patient requires continued Acute care hospital level OT services to progress safety and functional performance and allow for discharge.     If plan is discharge home, recommend the following:   A little help with walking and/or transfers;A little help with bathing/dressing/bathroom;Assistance with cooking/housework;Assist for transportation;Help with stairs or ramp for entrance     Functional Status Assessment   Patient has had a recent decline in their functional status and demonstrates the ability to make significant improvements in function in a reasonable and predictable amount of time.     Equipment Recommendations   Tub/shower seat;BSC/3in1;Other (comment) (patient reports he thinks he has from previous heart surgery)      Precautions/Restrictions   Precautions Precautions: Fall Precaution/Restrictions Comments: Thrombocytopenia/anemia Restrictions Weight Bearing Restrictions Per Provider Order: No RLE Weight Bearing Per Provider Order: Weight bearing as tolerated     Mobility Bed Mobility Overal bed mobility:  (was oob in recliner pre session and remained post)                  Transfers Overall  transfer level: Needs assistance Equipment used: Rolling walker (2 wheels) Transfers: Sit to/from Stand, Bed to chair/wheelchair/BSC Sit to Stand: Contact guard assist     Step pivot transfers: Contact guard assist     General transfer comment: min cues for hand placement and RW set up for transfer      Balance Overall balance assessment: Needs assistance Sitting-balance support: No upper extremity supported, Feet supported Sitting balance-Leahy Scale: Fair     Standing balance support: Bilateral upper extremity supported Standing balance-Leahy Scale: Poor                             ADL either performed or assessed with clinical judgement   ADL Overall ADL's : Needs assistance/impaired Eating/Feeding: Independent   Grooming: Wash/dry hands;Wash/dry face;Oral care;Sitting;Modified independent Grooming Details (indicate cue type and reason): also stood sink side with supervision with RW for hand washing Upper Body Bathing: Modified independent;Sitting   Lower Body Bathing: Minimal assistance;Sit to/from stand Lower Body Bathing Details (indicate cue type and reason): unable to reach to R Le but deferred use of LH sponge my wife will help me do that Upper Body Dressing : Modified independent;Sitting   Lower Body Dressing: Minimal assistance;Sit to/from stand Lower Body Dressing Details (indicate cue type and reason): again- decreased R LE reach for garment donning but deferred as reports wife wil assist with that Toilet Transfer: Supervision/safety;Rolling walker (2 wheels);Regular Teacher, Adult Education Details (indicate cue type and reason): commode over toilet, instructed in set up Toileting- Clothing Manipulation and Hygiene: Set up;Sitting/lateral lean     Tub/Shower Transfer Details (indicate cue type and reason):  rec use of shower chair Functional mobility during ADLs: Rolling walker (2 wheels);Contact guard assist (in room and level surfaces only  tested with OT) General ADL Comments: reports he has shower seat and used commode topper post heart surgery and has at home, OT did provide with handout re recs if needs to obtain online, decline offer for reacher but instructed this session for falls prevention and R LE reach     Vision Baseline Vision/History: 0 No visual deficits;1 Wears glasses              Pertinent Vitals/Pain Pain Assessment Pain Assessment: Faces Faces Pain Scale: Hurts a little bit Pain Location: R hip with activity Pain Descriptors / Indicators: Aching, Sore, Operative site guarding Pain Intervention(s): Monitored during session, Premedicated before session, Repositioned, Ice applied     Extremity/Trunk Assessment Upper Extremity Assessment Upper Extremity Assessment: Overall WFL for tasks assessed;Right hand dominant   Lower Extremity Assessment Lower Extremity Assessment: Defer to PT evaluation   Cervical / Trunk Assessment Cervical / Trunk Assessment: Normal   Communication Communication Communication: No apparent difficulties   Cognition Arousal: Alert Behavior During Therapy: WFL for tasks assessed/performed Cognition: No apparent impairments             OT - Cognition Comments: initially mild slower processing but as session evolved appeared [facility]; able to use phone to retrieve information, recalled current and hospital events with no issues, will continue to monitor but may be slower processing due to effects of pain meds and post op, no impulsivity noted                 Following commands: Intact       Cueing  General Comments   Cueing Techniques: Verbal cues  R hip post op incision covered and intact with ice and elevation, no edema noted           Home Living Family/patient expects to be discharged to:: Private residence Living Arrangements: Spouse/significant other Available Help at Discharge: Family;Available 24 hours/day Type of Home: House Home Access: Stairs  to enter [employer] of Steps: 4 Entrance Stairs-Rails: Right Home Layout: Able to live on main level with bedroom/bathroom     Bathroom Shower/Tub: Producer, Television/film/video: Standard Bathroom Accessibility: Yes   Home Equipment: Rollator (4 wheels);BSC/3in1;Shower seat   Additional Comments: goes to [employer]      Prior Functioning/Environment Prior Level of Function : Independent/Modified Independent             Mobility Comments: uses rollator in home and cane when he goes out ADLs Comments: indep    OT Problem List: Decreased strength;Decreased activity tolerance;Impaired balance (sitting and/or standing);Decreased knowledge of use of DME or AE;Decreased knowledge of precautions;Pain   OT Treatment/Interventions: Self-care/ADL training;Energy conservation;DME and/or AE instruction;Therapeutic activities;Patient/family education;Balance training      OT Goals(Current goals can be found in the care plan section)   ADL Goals Pt Will Perform Lower Body Bathing: with contact guard assist;with adaptive equipment;sit to/from stand Pt Will Perform Lower Body Dressing: with contact guard assist;with adaptive equipment;sit to/from stand Pt Will Transfer to Toilet: with modified independence;ambulating Pt Will Perform Toileting - Clothing Manipulation and hygiene: with modified independence Pt Will Perform Tub/Shower Transfer: with contact guard assist;Shower transfer;shower seat;rolling walker   OT Frequency:  Min 2X/week       AM-PAC OT 6 Clicks Daily Activity     Outcome Measure Help from another person eating meals?: None Help from another person  taking care of personal grooming?: None Help from another person toileting, which includes using toliet, bedpan, or urinal?: A Little Help from another person bathing (including washing, rinsing, drying)?: A Little Help from another person to put on and taking off regular upper body clothing?: None Help from  another person to put on and taking off regular lower body clothing?: A Little 6 Click Score: 21   End of Session Equipment Utilized During Treatment: Gait belt;Rolling walker (2 wheels) Nurse Communication: Mobility status  Activity Tolerance: Patient tolerated treatment well Patient left: in chair;with call bell/phone within reach;with chair alarm set  OT Visit Diagnosis: Unsteadiness on feet (R26.81);Pain Pain - Right/Left: Right Pain - part of body: Hip                Time: 8942-8872 OT Time Calculation (min): 30 min Charges:  OT General Charges $OT Visit: 1 Visit OT Treatments $Self Care/Home Management : 8-22 mins  Sehaj Kolden OT/L Acute Rehabilitation Department  [phone]  02/08/2024, 12:59 PM

## 2024-02-08 NOTE — Progress Notes (Signed)
" °   02/08/24 2200  BiPAP/CPAP/SIPAP  BiPAP/CPAP/SIPAP Pt Type Adult  Reason BIPAP/CPAP not in use Non-compliant (pt refuse for the night)  BiPAP/CPAP /SiPAP Vitals  Pulse Rate 94  Resp (!) 22  BP (!) 86/61  SpO2 100 %  MEWS Score/Color  MEWS Score 2  MEWS Score Color Yellow    "

## 2024-02-08 NOTE — Progress Notes (Signed)
 Physical Therapy Treatment Patient Details Name: Lonnie Hansen MRN: [id number] DOB: 1950-12-12 Today's Date: 02/08/2024   History of Present Illness Pt s/p R THR and with hx of CAD, GIB, AAA, CAD, bil carotid stenosis, Aortic valve replacement and chronic ITP    PT Comments  POD # 4 am session PT - Cognition Comments: AxO x 3 pleasant with slight impaired memory/recall.  Following all commands with 25% repeat. Slight cognitive delay/thought processing.   Assisted OOB to amb to the bathroom, assisted with a toilet transfer, a quick wash up then amb in hallway to practice stairs.  Pt did well to navigate 2 steps using one RIGHT rail and 25% VC's on proper sequencing.  Then returned to room to perform some TE's following HEP handout.  Instructed on proper tech, freq as well as use of ICE.   Addressed all mobility questions, discussed appropriate activity, educated on use of ICE.  Pt ready for D/C to home.    If plan is discharge home, recommend the following: A lot of help with walking and/or transfers;A lot of help with bathing/dressing/bathroom;Assistance with cooking/housework;Assist for transportation;Help with stairs or ramp for entrance   Can travel by private vehicle        Equipment Recommendations  Rolling walker (2 wheels)    Recommendations for Other Services       Precautions / Restrictions Precautions Precautions: Fall Precaution/Restrictions Comments: Thrombocytopenia/anemia Restrictions Weight Bearing Restrictions Per Provider Order: No RLE Weight Bearing Per Provider Order: Weight bearing as tolerated     Mobility  Bed Mobility Overal bed mobility: Needs Assistance Bed Mobility: Supine to Sit     Supine to sit: Supervision, Contact guard     General bed mobility comments: demonstarted and instructed how to use a strap to self assist LE off bed.    Transfers Overall transfer level: Needs assistance Equipment used: Rolling walker (2 wheels) Transfers: Sit  to/from Stand Sit to Stand: Supervision, Contact guard assist           General transfer comment: min cues for hand placement and RW set up for transfer.  Also assisted with a toilet transfer.    Ambulation/Gait Ambulation/Gait assistance: Supervision, Contact guard assist Gait Distance (Feet): 58 Feet Assistive device: Rolling walker (2 wheels) Gait Pattern/deviations: Step-to pattern, Decreased step length - right, Decreased step length - left, Shuffle, Trunk flexed Gait velocity: decr     General Gait Details: Increased time with initial difficulty advancing R LE; cues for posture, position from RW and initial sequence; slow moving   Stairs Stairs: Yes Stairs assistance: Contact guard assist Stair Management: One rail Right, Step to pattern, Forwards Number of Stairs: 2 General stair comments: assisted up 2 steps using B hands on right rail and min VC's on proper sequencing and proper tech.   Wheelchair Mobility     Tilt Bed    Modified Rankin (Stroke Patients Only)       Balance                                            Communication Communication Communication: No apparent difficulties  Cognition Arousal: Alert Behavior During Therapy: WFL for tasks assessed/performed   PT - Cognitive impairments: No apparent impairments                       PT - Cognition Comments:  AxO x 3 pleasant with slight impaired memory/recall.  Following all commands with 25% repeat. Following commands: Intact      Cueing Cueing Techniques: Verbal cues  Exercises  Total Hip Replacement TE's following HEP Handout 10 reps ankle pumps 05 reps knee presses 05 reps heel slides 05 reps SAQ's 05 reps ABD Instructed how to use a belt loop to assist  Followed by ICE     General Comments General comments (skin integrity, edema, etc.): R hip post op incision covered and intact with ice and elevation, no edema noted      Pertinent Vitals/Pain Pain  Assessment Pain Assessment: 0-10 Pain Score: 7  Pain Location: R hip with activity Pain Descriptors / Indicators: Aching, Sore, Operative site guarding Pain Intervention(s): Monitored during session, Patient requesting pain meds-RN notified, Repositioned, Ice applied    Home Living Family/patient expects to be discharged to:: Private residence Living Arrangements: Spouse/significant other Available Help at Discharge: Family;Available 24 hours/day Type of Home: House Home Access: Stairs to enter Entrance Stairs-Rails: Right Entrance Stairs-Number of Steps: 4   Home Layout: Able to live on main level with bedroom/bathroom Home Equipment: Rollator (4 wheels);BSC/3in1;Shower seat Additional Comments: goes to [employer]    Prior Function            PT Goals (current goals can now be found in the care plan section) Progress towards PT goals: Progressing toward goals    Frequency    7X/week      PT Plan      Co-evaluation              AM-PAC PT 6 Clicks Mobility   Outcome Measure  Help needed turning from your back to your side while in a flat bed without using bedrails?: A Little Help needed moving from lying on your back to sitting on the side of a flat bed without using bedrails?: A Little Help needed moving to and from a bed to a chair (including a wheelchair)?: A Little Help needed standing up from a chair using your arms (e.g., wheelchair or bedside chair)?: A Little Help needed to walk in hospital room?: A Little Help needed climbing 3-5 steps with a railing? : A Little 6 Click Score: 18    End of Session Equipment Utilized During Treatment: Gait belt Activity Tolerance: Patient tolerated treatment well Patient left: in chair;with call bell/phone within reach;with chair alarm set;with family/visitor present Nurse Communication: Mobility status PT Visit Diagnosis: Difficulty in walking, not elsewhere classified (R26.2);Muscle weakness (generalized)  (M62.81);Pain Pain - Right/Left: Right Pain - part of body: Hip     Time: 0910-0950 PT Time Calculation (min) (ACUTE ONLY): 40 min  Charges:    $Gait Training: 8-22 mins $Therapeutic Exercise: 8-22 mins $Therapeutic Activity: 8-22 mins PT General Charges $$ ACUTE PT VISIT: 1 Visit                    Katheryn Leap  PTA Acute  Rehabilitation Services Office M-F          [phone]

## 2024-02-08 NOTE — Progress Notes (Signed)
 Patient up in w/c about to d/c when he reported feeling like his heart was racing. RN assessed patient. BP stable/WNL, pulse newly irregular and tachy. Message sent to MD and asked NT to get EKG. Patient returned to bed, EKG showed A fib w/ RVR. Patient denies history of A fib. Rapid response RN called and responded to bedside. Dr Vernetta at beside soon after followed by new [facility] consult. New orders received. Patient to transfer to stepdown. Will monitor until change of care.

## 2024-02-08 NOTE — Progress Notes (Signed)
 Patient ID: Lonnie Hansen, male   DOB: [birth year], 74 y.o.   MRN: [id number] The patient is awake and alert this morning.  His vital signs are stable.  His hemoglobin has improved and his creatinine has improved.  Clinically he looks pretty good.  We really appreciate Dr. Jessy consultation and support of this patient.  The goal is to have Lonnie Hansen up today to maximize PT and to discharge him to home this afternoon.

## 2024-02-08 NOTE — Progress Notes (Addendum)
 Lonnie Hansen looks good this morning.  He is doing all that he can to get over his hip surgery.  Hopefully, he may go home today.  He did get Nplate  tomorrow.  His platelet count went up to 49,000.  He continued the Nplate  a couple days before we start to see the actual effect.  He did get a unit of blood.  He did get Procrit .  He got a dose of iron .  I want to get another dose of iron  today.  His labs show sodium 136.  Potassium 3.7.  BUN 19 creatinine 1.32.  Calcium  8.7 with albumin  of 3.6.  His iron  saturation was only 10%.  As such, that is why we are giving him the IV iron .  His CBC shows white cell count is 8.6.  Hemoglobin 8.9.  Platelet count 49,000.  He has had no bleeding.  There is no as much swelling in the right hip/thigh.  His appetite is doing okay.  He has had no nausea or vomiting.  He has had no fever.  He has had no cough or shortness of breath.  Again, sounds like he may go home today.  That would be wonderful.  He would be happy about that.  His vital signs show temperature of 99.5.  Pulse 80.  Blood pressure 131/79.  His head and neck exam shows no ocular or oral lesions.  His lungs sound clear bilaterally.  He has good air movement bilaterally.  Cardiac exam regular rate and rhythm.  Abdomen is soft.  Bowel sounds are present.  He has no fluid wave.  There is no palpable liver or spleen tip.  Extremities she has a surgical scar that is dressed in the right hip.  There is still is a little bit of swelling in the right thigh.  There is no significant ecchymoses.  Lonnie Hansen has chronic thrombocytopenia.  I suspect that the is probably mild ITP.  He responds well to Nplate .  I would think that the platelet count would trend upward has a week goes on.  Again he needs another dose of IV iron .  If he goes home, I will put him on some oral iron .  Somehow, we really do need to check labs on him.  I will have to see if we can get lab work at home for him.  As always, I know that  he is getting incredible care from everybody on [facility] [address].   Jeralyn Crease, MD  [place] 17:14

## 2024-02-08 NOTE — Progress Notes (Signed)
" °   02/08/24 0206  BiPAP/CPAP/SIPAP  BiPAP/CPAP/SIPAP Pt Type Adult  Reason BIPAP/CPAP not in use Non-compliant    "

## 2024-02-09 ENCOUNTER — Inpatient Hospital Stay (HOSPITAL_COMMUNITY)

## 2024-02-09 ENCOUNTER — Other Ambulatory Visit (HOSPITAL_COMMUNITY): Payer: Self-pay

## 2024-02-09 ENCOUNTER — Encounter (HOSPITAL_COMMUNITY): Payer: Self-pay | Admitting: Orthopaedic Surgery

## 2024-02-09 DIAGNOSIS — M1611 Unilateral primary osteoarthritis, right hip: Secondary | ICD-10-CM | POA: Diagnosis not present

## 2024-02-09 DIAGNOSIS — I4891 Unspecified atrial fibrillation: Secondary | ICD-10-CM

## 2024-02-09 LAB — ECHOCARDIOGRAM COMPLETE
AR max vel: 3.61 cm2
AV Area VTI: 3.64 cm2
AV Area mean vel: 3.38 cm2
AV Mean grad: 9 mmHg
AV Peak grad: 17.4 mmHg
Ao pk vel: 2.09 m/s
Area-P 1/2: 4.49 cm2
Calc EF: 51.4 %
Height: 69 in
MV M vel: 5.15 m/s
MV Peak grad: 106.1 mmHg
Radius: 0.8 cm
S' Lateral: 3.6 cm
Single Plane A2C EF: 42 %
Single Plane A4C EF: 53.5 %
Weight: 2906.54 [oz_av]

## 2024-02-09 LAB — HEMOGLOBIN AND HEMATOCRIT, BLOOD
HCT: 26.6 % — ABNORMAL LOW (ref 39.0–52.0)
Hemoglobin: 9.3 g/dL — ABNORMAL LOW (ref 13.0–17.0)

## 2024-02-09 LAB — CBC WITH DIFFERENTIAL/PLATELET
Abs Immature Granulocytes: 0.08 K/uL — ABNORMAL HIGH (ref 0.00–0.07)
Basophils Absolute: 0 K/uL (ref 0.0–0.1)
Basophils Relative: 0 %
Eosinophils Absolute: 0.2 K/uL (ref 0.0–0.5)
Eosinophils Relative: 2 %
HCT: 22.6 % — ABNORMAL LOW (ref 39.0–52.0)
Hemoglobin: 8.1 g/dL — ABNORMAL LOW (ref 13.0–17.0)
Immature Granulocytes: 1 %
Lymphocytes Relative: 10 %
Lymphs Abs: 0.9 K/uL (ref 0.7–4.0)
MCH: 31.8 pg (ref 26.0–34.0)
MCHC: 35.8 g/dL (ref 30.0–36.0)
MCV: 88.6 fL (ref 80.0–100.0)
Monocytes Absolute: 1.2 K/uL — ABNORMAL HIGH (ref 0.1–1.0)
Monocytes Relative: 13 %
Neutro Abs: 6.7 K/uL (ref 1.7–7.7)
Neutrophils Relative %: 74 %
Platelets: 50 K/uL — ABNORMAL LOW (ref 150–400)
RBC: 2.55 MIL/uL — ABNORMAL LOW (ref 4.22–5.81)
RDW: 15.8 % — ABNORMAL HIGH (ref 11.5–15.5)
Smear Review: NORMAL
WBC: 9.1 K/uL (ref 4.0–10.5)
nRBC: 0.2 % (ref 0.0–0.2)

## 2024-02-09 LAB — COMPREHENSIVE METABOLIC PANEL WITH GFR
ALT: 42 U/L (ref 0–44)
AST: 73 U/L — ABNORMAL HIGH (ref 15–41)
Albumin: 3.4 g/dL — ABNORMAL LOW (ref 3.5–5.0)
Alkaline Phosphatase: 42 U/L (ref 38–126)
Anion gap: 11 (ref 5–15)
BUN: 19 mg/dL (ref 8–23)
CO2: 24 mmol/L (ref 22–32)
Calcium: 8.6 mg/dL — ABNORMAL LOW (ref 8.9–10.3)
Chloride: 100 mmol/L (ref 98–111)
Creatinine, Ser: 1.29 mg/dL — ABNORMAL HIGH (ref 0.61–1.24)
GFR, Estimated: 58 mL/min — ABNORMAL LOW
Glucose, Bld: 112 mg/dL — ABNORMAL HIGH (ref 70–99)
Potassium: 4 mmol/L (ref 3.5–5.1)
Sodium: 135 mmol/L (ref 135–145)
Total Bilirubin: 0.9 mg/dL (ref 0.0–1.2)
Total Protein: 5.7 g/dL — ABNORMAL LOW (ref 6.5–8.1)

## 2024-02-09 LAB — MAGNESIUM: Magnesium: 2.1 mg/dL (ref 1.7–2.4)

## 2024-02-09 LAB — PREPARE RBC (CROSSMATCH)

## 2024-02-09 MED ORDER — EPOETIN ALFA-EPBX 10000 UNIT/ML IJ SOLN
40000.0000 [IU] | Freq: Once | INTRAMUSCULAR | Status: AC
  Administered 2024-02-09: 40000 [IU] via SUBCUTANEOUS
  Filled 2024-02-09: qty 4

## 2024-02-09 MED ORDER — FOLIC ACID 1 MG PO TABS
2.0000 mg | ORAL_TABLET | Freq: Every day | ORAL | Status: DC
  Administered 2024-02-09 – 2024-02-10 (×2): 2 mg via ORAL
  Filled 2024-02-09 (×2): qty 2

## 2024-02-09 MED ORDER — FOLIC ACID 1 MG PO TABS
2.0000 mg | ORAL_TABLET | Freq: Every day | ORAL | 6 refills | Status: AC
  Filled 2024-02-09: qty 60, 30d supply, fill #0

## 2024-02-09 MED ORDER — ALBUMIN HUMAN 25 % IV SOLN
12.5000 g | Freq: Once | INTRAVENOUS | Status: AC
  Administered 2024-02-09: 12.5 g via INTRAVENOUS
  Filled 2024-02-09: qty 50

## 2024-02-09 MED ORDER — AMIODARONE HCL 200 MG PO TABS
200.0000 mg | ORAL_TABLET | Freq: Two times a day (BID) | ORAL | Status: DC
  Administered 2024-02-09 – 2024-02-10 (×3): 200 mg via ORAL
  Filled 2024-02-09 (×3): qty 1

## 2024-02-09 MED ORDER — SODIUM CHLORIDE 0.9% IV SOLUTION: Freq: Once | INTRAVENOUS | Status: AC

## 2024-02-09 NOTE — Progress Notes (Addendum)
 "  Cardiology:  Lonnie Hansen  Subjective:  Denies SSCP, palpitations or Dyspnea Seems to have converted   Objective:  Vitals:   02/09/24 0647 02/09/24 0700 02/09/24 0800 02/09/24 0843  BP: 99/64 (!) 102/48 (!) 115/52 110/66  Pulse: 90 66 66 67  Resp: 17 15 20 12   Temp: 98.4 F (36.9 C)   98.6 F (37 C)  TempSrc: Oral   Oral  SpO2: 99% 100% 98% 100%  Weight:      Height:        Intake/Output from previous day:  Intake/Output Summary (Last 24 hours) at 02/09/2024 0901 Last data filed at 02/09/2024 0843 Gross per 24 hour  Intake 1039.29 ml  Output 1500 ml  Net -460.71 ml    Physical Exam: Affect appropriate Healthy:  appears stated age HEENT: normal Neck supple with no adenopathy JVP normal no bruits no thyromegaly Lungs clear with no wheezing and good diaphragmatic motion Heart:  S1/S2 SEM through AVR no AR murmur, no rub, gallop or click PMI normal  Prior sternotomy  Abdomen: benighn, BS positve, no tenderness, no AAA no bruit.  No HSM or HJR Distal pulses intact with no bruits Post right THR    Lab Results: Basic Metabolic Panel: Recent Labs    02/08/24 0317 02/08/24 1556 02/09/24 0326  NA 136  --  135  K 3.7  --  4.0  CL 100  --  100  CO2 26  --  24  GLUCOSE 107*  --  112*  BUN 19  --  19  CREATININE 1.32*  --  1.29*  CALCIUM  8.7*  --  8.6*  MG  --  1.9 2.1   Liver Function Tests: Recent Labs    02/08/24 0317 02/09/24 0326  AST 60* 73*  ALT 28 42  ALKPHOS 41 42  BILITOT 1.1 0.9  PROT 5.9* 5.7*  ALBUMIN  3.6 3.4*   No results for input(s): LIPASE, AMYLASE in the last 72 hours. CBC: Recent Labs    02/08/24 0317 02/08/24 2328 02/09/24 0326  WBC 8.6 8.9 9.1  NEUTROABS 6.2  --  6.7  HGB 8.9* 8.3* 8.1*  HCT 25.5* 24.1* 22.6*  MCV 90.4 90.9 88.6  PLT 49* 56* 50*    Anemia Panel: Recent Labs    02/07/24 0749  TIBC 228*  IRON  24*    Imaging: No results found.  Cardiac Studies:  ECG: yesterday afib pending this am   Telemetry:   NSR rates 65-72 bpm  Echo: pending  Medications:    Chlorhexidine  Gluconate Cloth  6 each Topical Daily   docusate sodium   100 mg Oral BID   epoetin  alfa-epbx  40,000 Units Subcutaneous Once   folic acid   2 mg Oral Daily   latanoprost   1 drop Both Eyes BID   multivitamin with minerals  1 tablet Oral Daily   pantoprazole   40 mg Oral BID   rosuvastatin   20 mg Oral Daily   tamsulosin   0.4 mg Oral QHS      sodium chloride  Stopped (02/05/24 1653)   amiodarone  30 mg/hr (02/09/24 0800)   ferric gluconate (FERRLECIT) IVPB Stopped (02/08/24 1228)    Assessment/Plan:   PAF:  quick conversion with cardizem  and amiodarone . Echo this am Check ECG to confirm conversion change amiodarone  over to oral 200 mg PO bid. Prior to d/c will change to LA cardizem . Read Dr Jessy note but I would still be hesitant to start anticoagulation. Will need 30 day monitor on d/c Cytopenia.  Getting another  transfusion for Hct 22.6 has had Procrit .  PLT 50 and has had Nplat THR:  ok to ambulate and do normal PT/OT today   Maude Emmer 02/09/2024, 9:01 AM    "

## 2024-02-09 NOTE — Progress Notes (Signed)
 [name] is in the ICU now.  He developed atrial fibrillation yesterday.  He is on amiodarone  drip right now.  He feels okay.  When the atrial fibrillation started, he said he really felt horrible.  He just felt very tired.  He had some palpitations.  I am just surprised that he developed atrial fibrillation.  His labs today show white cell count of 9.1.  Hemoglobin 8.1.  Platelet count 50,000.  BUN is 19 and creatinine 1.29.  Calcium  8.6 with an albumin  of 3.4.  He has gotten IV iron .  It may take a while for this to work and get his blood count back up.  We may put him on a little folic acid .  If he needs to be on anticoagulation, I think it would be okay from my point of view.  We can work on keeping his platelet count up.  Nplate  usually has worked quite well.  He still is in atrial fibrillation but the rate is well-controlled.  He has been doing his physical therapy.  His vital signs are stable.  Blood pressure 100/64.  Temperature 98.4.  Pulse 90.  His lungs sound clear bilaterally.  He has good air movement bilaterally.  Cardiac exam is regular rate and rhythm.  Again, the rate is well-controlled.  Abdominal exam is soft.  His extremities shows the healing right hip repair scar.  Neurological exam is nonfocal.  The problem right now is the atrial fibrillation.  Again, his blood counts are slowly on the low side.  He may be at the point where he may need to be transfused.  I will still watch this.  I know that he will get incredible care from everybody down in the ICU.  Jeralyn Crease, MD  [place] 1:5

## 2024-02-09 NOTE — Progress Notes (Signed)
" °   02/09/24 2206  BiPAP/CPAP/SIPAP  BiPAP/CPAP/SIPAP Pt Type Adult  Reason BIPAP/CPAP not in use Non-compliant (Pt refusing to wear CPAP for the night. RT advised to call if he changes his mind.)    "

## 2024-02-09 NOTE — Plan of Care (Signed)
?  Problem: Education: ?Goal: Knowledge of disease or condition will improve ?Outcome: Progressing ?Goal: Understanding of medication regimen will improve ?Outcome: Progressing ?  ?Problem: Activity: ?Goal: Ability to tolerate increased activity will improve ?Outcome: Progressing ?  ?Problem: Health Behavior/Discharge Planning: ?Goal: Ability to safely manage health-related needs after discharge will improve ?Outcome: Progressing ?  ?

## 2024-02-09 NOTE — Progress Notes (Signed)
" °  Echocardiogram 2D Echocardiogram has been performed.  Lonnie Hansen 02/09/2024, 12:05 PM "

## 2024-02-09 NOTE — Progress Notes (Signed)
 " PROGRESS NOTE  Lonnie Hansen FMW:[id number] DOB: February 14, 1950   PCP: Yolande Toribio MATSU, MD  Patient is from: Home.  Lives with his wife.  DOA: 02/04/2024 LOS: 4  Chief complaints No chief complaint on file.    Brief Narrative / Interim history: 74 year old M with PMH of CAD/stent, AR s/p AVR, AAA s/p EVAR, HTN, ITP, BPH and osteoarthritis admitted by orthopedic surgery for right hip osteoarthritis and underwent THA on 1/9.  Postoperative course complicated by pancytopenia requiring blood transfusion.  He developed A-fib with RVR on 1/13.  Cardiology and hospitalist service consulted.  Patient was started on amiodarone  drip.    Subjective: Seen and examined earlier this morning.  No major events overnight or this morning.  No complaints.  Denies palpitation, dizziness or shortness of breath.  Hip pain with movement.   Assessment and plan: Unilateral primary osteoarthritis, right hip -S/p right THA by Dr. Vernetta on 1/9 -Management per orthopedic surgery.   Onset atrial fibrillation with RVR: A-fib seems to have resolved.  EKG with what looks like ectopic P wave.  Has regular rhythm on exam.  CHA2DS2-VASc score 3 (age, HTN and CAD) -Transition to p.o. amiodarone  200 mg twice daily by cardiology -Not on anticoagulation due to pancytopenia/thrombocytopenia. -Follow echocardiogram -Optimize electrolytes. - Cardiology on board  History of CAD s/p remote stent.  Denies cardiopulmonary symptoms. -Continue Crestor   Anemia/thrombocytopenia: History of ITP.  He is platelet is 120 on 1/5 and 76 on admission.  It seems he received 1 unit of platelet and 4 units of blood so far. -Started on Epogen  by hematology.  Also getting 1 unit today. -Appreciate help by hematology. -Continue monitoring  Essential hypertension: Soft BP this morning.  Home amlodipine , atenolol  and Hyzaar HCT discontinued. - Continue monitoring  CKD-3A: Creatinine stable and better. - Continue monitoring     Hyperlipidemia -Continue rosuvastatin  20 mg p.o. daily.   Glaucoma -Continue Xalatan  drops.   GERD -Continue pantoprazole  40 mg p.o. twice daily.  BPH - Continue tamsulosin   Body mass index is 26.83 kg/m.          DVT prophylaxis:  SCDs Start: 02/04/24 1404  Code Status: Full code Family Communication: Updated patient's wife at bedside Level of care: Stepdown Status is: Inpatient Remains inpatient appropriate because: Atrial fibrillation, hypotension   Final disposition: Per primary.   55 minutes with more than 50% spent in reviewing records, counseling patient/family and coordinating care.  Consultants:  Hospitalist Cardiology  Procedures: 1/9-right THA  Microbiology summarized: None  Objective: Vitals:   02/09/24 0900 02/09/24 1000 02/09/24 1100 02/09/24 1102  BP: 110/70 (!) 109/59 (!) 78/47 (!) 90/50  Pulse: 67 72 70 70  Resp: 16 14 13 14   Temp:      TempSrc:      SpO2: 100% 100% 100% 98%  Weight:      Height:        Examination:  GENERAL: No apparent distress.  Nontoxic. HEENT: MMM.  Vision and hearing grossly intact.  NECK: Supple.  No apparent JVD.  RESP:  No IWOB.  Fair aeration bilaterally. CVS:  RRR. Heart sounds normal.  ABD/GI/GU: BS+. Abd soft, NTND.  MSK/EXT:  Moves extremities. No apparent deformity. No edema.  SKIN: Surgical dressing DCI. NEURO: AA.  Oriented appropriately.  No apparent focal neuro deficit. PSYCH: Calm. Normal affect.   Sch Meds:  Scheduled Meds:  amiodarone   200 mg Oral BID   Chlorhexidine  Gluconate Cloth  6 each Topical Daily   docusate sodium   100 mg Oral BID   epoetin  alfa-epbx  40,000 Units Subcutaneous Once   folic acid   2 mg Oral Daily   latanoprost   1 drop Both Eyes BID   multivitamin with minerals  1 tablet Oral Daily   pantoprazole   40 mg Oral BID   rosuvastatin   20 mg Oral Daily   tamsulosin   0.4 mg Oral QHS   Continuous Infusions:  sodium chloride  Stopped (02/05/24 1653)   ferric  gluconate (FERRLECIT) IVPB 250 mg (02/09/24 0957)   PRN Meds:.acetaminophen , alum & mag hydroxide-simeth, diphenhydrAMINE , HYDROmorphone  (DILAUDID ) injection, menthol  **OR** phenol, methocarbamol  **OR** methocarbamol  (ROBAXIN ) injection, metoCLOPramide  **OR** metoCLOPramide  (REGLAN ) injection, ondansetron  **OR** ondansetron  (ZOFRAN ) IV, mouth rinse, oxyCODONE , oxyCODONE   Antimicrobials: Anti-infectives (From admission, onward)    Start     Dose/Rate Route Frequency Ordered Stop   02/04/24 1500  ceFAZolin  (ANCEF ) IVPB 2g/100 mL premix        2 g 200 mL/hr over 30 Minutes Intravenous Every 6 hours 02/04/24 1403 02/04/24 2051   02/04/24 0600  ceFAZolin  (ANCEF ) IVPB 2g/100 mL premix        2 g 200 mL/hr over 30 Minutes Intravenous On call to O.R. 02/04/24 0542 02/04/24 0716        I have personally reviewed the following labs and images: CBC: Recent Labs  Lab 02/05/24 0519 02/06/24 0316 02/07/24 0317 02/08/24 0317 02/08/24 2328 02/09/24 0326  WBC 12.2* 9.8 9.3 8.6 8.9 9.1  NEUTROABS 9.6*  --   --  6.2  --  6.7  HGB 7.4* 8.1* 7.3* 8.9* 8.3* 8.1*  HCT 20.9* 23.4* 21.8* 25.5* 24.1* 22.6*  MCV 91.3 88.3 91.6 90.4 90.9 88.6  PLT 62* 41* 43* 49* 56* 50*   BMP &GFR Recent Labs  Lab 02/05/24 0313 02/08/24 0317 02/08/24 1556 02/09/24 0326  NA 136 136  --  135  K 4.2 3.7  --  4.0  CL 102 100  --  100  CO2 21* 26  --  24  GLUCOSE 135* 107*  --  112*  BUN 25* 19  --  19  CREATININE 1.64* 1.32*  --  1.29*  CALCIUM  8.1* 8.7*  --  8.6*  MG  --   --  1.9 2.1   Estimated Creatinine Clearance: 50.2 mL/min (A) (by C-G formula based on SCr of 1.29 mg/dL (H)). Liver & Pancreas: Recent Labs  Lab 02/08/24 0317 02/09/24 0326  AST 60* 73*  ALT 28 42  ALKPHOS 41 42  BILITOT 1.1 0.9  PROT 5.9* 5.7*  ALBUMIN  3.6 3.4*   No results for input(s): LIPASE, AMYLASE in the last 168 hours. No results for input(s): AMMONIA in the last 168 hours. Diabetic: No results for input(s):  HGBA1C in the last 72 hours. No results for input(s): GLUCAP in the last 168 hours. Cardiac Enzymes: No results for input(s): CKTOTAL, CKMB, CKMBINDEX, TROPONINI in the last 168 hours. No results for input(s): PROBNP in the last 8760 hours. Coagulation Profile: No results for input(s): INR, PROTIME in the last 168 hours. Thyroid  Function Tests: No results for input(s): TSH, T4TOTAL, FREET4, T3FREE, THYROIDAB in the last 72 hours. Lipid Profile: No results for input(s): CHOL, HDL, LDLCALC, TRIG, CHOLHDL, LDLDIRECT in the last 72 hours. Anemia Panel: Recent Labs    02/07/24 0749  TIBC 228*  IRON  24*   Urine analysis:    Component Value Date/Time   COLORURINE COLORLESS (A) 08/21/2022 1628   APPEARANCEUR CLEAR 08/21/2022 1628   LABSPEC 1.008 08/21/2022 1628   PHURINE  5.0 08/21/2022 1628   GLUCOSEU NEGATIVE 08/21/2022 1628   HGBUR LARGE (A) 08/21/2022 1628   BILIRUBINUR NEGATIVE 08/21/2022 1628   KETONESUR NEGATIVE 08/21/2022 1628   PROTEINUR NEGATIVE 08/21/2022 1628   UROBILINOGEN 0.2 04/27/2010 2330   NITRITE NEGATIVE 08/21/2022 1628   LEUKOCYTESUR NEGATIVE 08/21/2022 1628   Sepsis Labs: Invalid input(s): PROCALCITONIN, LACTICIDVEN  Microbiology: No results found for this or any previous visit (from the past 240 hours).  Radiology Studies: No results found.    Dimitri Shakespeare T. Dana Debo [facility]  If 7PM-7AM, please contact night-coverage [email or web address] 02/09/2024, 11:29 AM   "

## 2024-02-09 NOTE — Progress Notes (Signed)
 Subjective: 5 Days Post-Op Procedures (LRB): ARTHROPLASTY, HIP, TOTAL, ANTERIOR APPROACH (Right) Patient reports pain as moderate.  Appears comfortable.  Still in A-fib but rate controlled.  Objective: Vital signs in last 24 hours: Temp:  [98.3 F (36.8 C)-99.7 F (37.6 C)] 98.4 F (36.9 C) (01/14 0647) Pulse Rate:  [46-155] 66 (01/14 0800) Resp:  [13-25] 20 (01/14 0800) BP: (82-125)/(44-90) 115/52 (01/14 0800) SpO2:  [95 %-100 %] 98 % (01/14 0800)  Intake/Output from previous day: 01/13 0701 - 01/14 0700 In: 986.9 [P.O.:240; I.V.:419.8; Blood:16; IV Piggyback:311.1] Out: 2300 [Urine:2300] Intake/Output this shift: Total I/O In: 46.4 [I.V.:46.4] Out: -   Recent Labs    02/07/24 0317 02/08/24 0317 02/08/24 2328 02/09/24 0326  HGB 7.3* 8.9* 8.3* 8.1*   Recent Labs    02/08/24 2328 02/09/24 0326  WBC 8.9 9.1  RBC 2.65* 2.55*  HCT 24.1* 22.6*  PLT 56* 50*   Recent Labs    02/08/24 0317 02/09/24 0326  NA 136 135  K 3.7 4.0  CL 100 100  CO2 26 24  BUN 19 19  CREATININE 1.32* 1.29*  GLUCOSE 107* 112*  CALCIUM  8.7* 8.6*   No results for input(s): LABPT, INR in the last 72 hours.  Dorsiflexion/Plantar flexion intact Incision: scant drainage Compartment soft Right thigh soft  Assessment/Plan: 5 Days Post-Op Procedures (LRB): ARTHROPLASTY, HIP, TOTAL, ANTERIOR APPROACH (Right) Acute on chronic receiving 1 unit of blood this morning. Up with therapy Float heels when in bed     Denisia Harpole 02/09/2024, 8:40 AM

## 2024-02-09 NOTE — Progress Notes (Signed)
 Physical Therapy Treatment Patient Details Name: Lonnie Hansen MRN: [id number] DOB: 1950-05-04 Today's Date: 02/09/2024   History of Present Illness Pt s/p R THR and with hx of CAD, GIB, AAA, CAD, bil carotid stenosis, Aortic valve replacement and chronic ITP    PT Comments  POD # 5 Pt seen in [facility] RM# 1235 AM session withheld medical workup PM session assisted OOB and amb in hallway.  R hip/thigh visibly enlarged.  Pt in bed AxO 3 pleasant and cracking jokes.  Tolerated session well with only 5/10 hip pain and no other c/o's.  General Gait Details: Pt tolerated amb 64 feet at Min/Contact Guard Assist with walker with avg HR 77 and steady BP's.  Tolerated well with no c/o's.  This is the best I have felt, stated Pt.  Rehab Team to continue to follow as Pt plans to D/C to home when medically cleared.     If plan is discharge home, recommend the following: A lot of help with walking and/or transfers;A lot of help with bathing/dressing/bathroom;Assistance with cooking/housework;Assist for transportation;Help with stairs or ramp for entrance   Can travel by private vehicle        Equipment Recommendations  Rolling walker (2 wheels)    Recommendations for Other Services       Precautions / Restrictions Precautions Precautions: Fall Precaution/Restrictions Comments: Thrombocytopenia/anemia + low platelets Restrictions Weight Bearing Restrictions Per Provider Order: No RLE Weight Bearing Per Provider Order: Weight bearing as tolerated     Mobility  Bed Mobility Overal bed mobility: Needs Assistance Bed Mobility: Supine to Sit     Supine to sit: Contact guard, Min assist     General bed mobility comments: required assist to guide R LE off bed as well as use bed pad to complete scooting to EOB.    Transfers Overall transfer level: Needs assistance Equipment used: Rolling walker (2 wheels) Transfers: Sit to/from Stand Sit to Stand: Supervision, Contact guard assist            General transfer comment: Pt self able to rise from elevated bed with min VC's on proper hand placement to avoid pull self up on walker.    Ambulation/Gait Ambulation/Gait assistance: Supervision, Contact guard assist Gait Distance (Feet): 64 Feet Assistive device: Rolling walker (2 wheels) Gait Pattern/deviations: Step-to pattern, Decreased step length - right, Decreased step length - left, Shuffle, Trunk flexed Gait velocity: decr     General Gait Details: Pt tolerated amb 64 feet at Min/Contact Guard Assist with walker with avg HR 77 and steady BP's.  Tolerated well with no c/o's.  This is the best I have felt, stated Pt.   Stairs             Wheelchair Mobility     Tilt Bed    Modified Rankin (Stroke Patients Only)       Balance                                            Communication    Cognition   Behavior During Therapy: WFL for tasks assessed/performed   PT - Cognitive impairments: No apparent impairments                       PT - Cognition Comments: AxO x 3 feeling much better. Following commands: Intact      Cueing  Exercises      General Comments        Pertinent Vitals/Pain Pain Assessment Pain Assessment: 0-10 Pain Score: 5  Pain Location: R hip with activity Pain Descriptors / Indicators: Aching, Sore, Operative site guarding Pain Intervention(s): Monitored during session, Premedicated before session, Repositioned, Ice applied    Home Living                          Prior Function            PT Goals (current goals can now be found in the care plan section) Progress towards PT goals: Progressing toward goals    Frequency    7X/week      PT Plan      Co-evaluation              AM-PAC PT 6 Clicks Mobility   Outcome Measure  Help needed turning from your back to your side while in a flat bed without using bedrails?: A Little Help needed moving from  lying on your back to sitting on the side of a flat bed without using bedrails?: A Little Help needed moving to and from a bed to a chair (including a wheelchair)?: A Little Help needed standing up from a chair using your arms (e.g., wheelchair or bedside chair)?: A Little Help needed to walk in hospital room?: A Little Help needed climbing 3-5 steps with a railing? : A Lot 6 Click Score: 17    End of Session Equipment Utilized During Treatment: Gait belt Activity Tolerance: Patient tolerated treatment well Patient left: in chair;with call bell/phone within reach;with chair alarm set;with family/visitor present Nurse Communication: Mobility status PT Visit Diagnosis: Difficulty in walking, not elsewhere classified (R26.2);Muscle weakness (generalized) (M62.81);Pain Pain - Right/Left: Right Pain - part of body: Hip     Time: 8498-8473 PT Time Calculation (min) (ACUTE ONLY): 25 min  Charges:    $Gait Training: 8-22 mins $Therapeutic Activity: 8-22 mins PT General Charges $$ ACUTE PT VISIT: 1 Visit                     Katheryn Leap  PTA Acute  Rehabilitation Services Office M-F          [phone]

## 2024-02-10 ENCOUNTER — Other Ambulatory Visit (HOSPITAL_COMMUNITY): Payer: Self-pay

## 2024-02-10 ENCOUNTER — Other Ambulatory Visit: Payer: Self-pay

## 2024-02-10 ENCOUNTER — Inpatient Hospital Stay

## 2024-02-10 DIAGNOSIS — D693 Immune thrombocytopenic purpura: Secondary | ICD-10-CM

## 2024-02-10 DIAGNOSIS — I480 Paroxysmal atrial fibrillation: Secondary | ICD-10-CM

## 2024-02-10 DIAGNOSIS — Z96641 Presence of right artificial hip joint: Secondary | ICD-10-CM

## 2024-02-10 DIAGNOSIS — E785 Hyperlipidemia, unspecified: Secondary | ICD-10-CM

## 2024-02-10 DIAGNOSIS — K219 Gastro-esophageal reflux disease without esophagitis: Secondary | ICD-10-CM

## 2024-02-10 DIAGNOSIS — I2510 Atherosclerotic heart disease of native coronary artery without angina pectoris: Secondary | ICD-10-CM

## 2024-02-10 DIAGNOSIS — I2583 Coronary atherosclerosis due to lipid rich plaque: Secondary | ICD-10-CM

## 2024-02-10 DIAGNOSIS — I10 Essential (primary) hypertension: Secondary | ICD-10-CM

## 2024-02-10 LAB — TYPE AND SCREEN
ABO/RH(D): AB POS
Antibody Screen: NEGATIVE
Donor AG Type: NEGATIVE
Donor AG Type: NEGATIVE
Unit division: 0
Unit division: 0

## 2024-02-10 LAB — CBC WITH DIFFERENTIAL/PLATELET
Abs Immature Granulocytes: 0.05 K/uL (ref 0.00–0.07)
Basophils Absolute: 0 K/uL (ref 0.0–0.1)
Basophils Relative: 0 %
Eosinophils Absolute: 0.3 K/uL (ref 0.0–0.5)
Eosinophils Relative: 4 %
HCT: 25.7 % — ABNORMAL LOW (ref 39.0–52.0)
Hemoglobin: 8.5 g/dL — ABNORMAL LOW (ref 13.0–17.0)
Immature Granulocytes: 1 %
Lymphocytes Relative: 11 %
Lymphs Abs: 0.9 K/uL (ref 0.7–4.0)
MCH: 30 pg (ref 26.0–34.0)
MCHC: 33.1 g/dL (ref 30.0–36.0)
MCV: 90.8 fL (ref 80.0–100.0)
Monocytes Absolute: 1 K/uL (ref 0.1–1.0)
Monocytes Relative: 13 %
Neutro Abs: 5.6 K/uL (ref 1.7–7.7)
Neutrophils Relative %: 71 %
Platelets: 51 K/uL — ABNORMAL LOW (ref 150–400)
RBC: 2.83 MIL/uL — ABNORMAL LOW (ref 4.22–5.81)
RDW: 17 % — ABNORMAL HIGH (ref 11.5–15.5)
WBC: 7.8 K/uL (ref 4.0–10.5)
nRBC: 0 % (ref 0.0–0.2)

## 2024-02-10 LAB — MAGNESIUM: Magnesium: 2.1 mg/dL (ref 1.7–2.4)

## 2024-02-10 LAB — COMPREHENSIVE METABOLIC PANEL WITH GFR
ALT: 47 U/L — ABNORMAL HIGH (ref 0–44)
AST: 65 U/L — ABNORMAL HIGH (ref 15–41)
Albumin: 3.5 g/dL (ref 3.5–5.0)
Alkaline Phosphatase: 46 U/L (ref 38–126)
Anion gap: 9 (ref 5–15)
BUN: 19 mg/dL (ref 8–23)
CO2: 26 mmol/L (ref 22–32)
Calcium: 8.7 mg/dL — ABNORMAL LOW (ref 8.9–10.3)
Chloride: 103 mmol/L (ref 98–111)
Creatinine, Ser: 1.35 mg/dL — ABNORMAL HIGH (ref 0.61–1.24)
GFR, Estimated: 55 mL/min — ABNORMAL LOW
Glucose, Bld: 104 mg/dL — ABNORMAL HIGH (ref 70–99)
Potassium: 3.5 mmol/L (ref 3.5–5.1)
Sodium: 139 mmol/L (ref 135–145)
Total Bilirubin: 1.2 mg/dL (ref 0.0–1.2)
Total Protein: 5.8 g/dL — ABNORMAL LOW (ref 6.5–8.1)

## 2024-02-10 LAB — BPAM RBC
Blood Product Expiration Date: 202601262359
Blood Product Expiration Date: 202601262359
ISSUE DATE / TIME: 202601121043
ISSUE DATE / TIME: 202601140622
Unit Type and Rh: 202601262359
Unit Type and Rh: 6200
Unit Type and Rh: 6200

## 2024-02-10 MED ORDER — DILTIAZEM HCL ER 120 MG PO CP24
120.0000 mg | ORAL_CAPSULE | Freq: Every day | ORAL | 0 refills | Status: AC
  Filled 2024-02-10: qty 90, 90d supply, fill #0

## 2024-02-10 MED ORDER — DILTIAZEM HCL ER COATED BEADS 120 MG PO CP24
120.0000 mg | ORAL_CAPSULE | Freq: Every day | ORAL | Status: DC
  Administered 2024-02-10: 120 mg via ORAL
  Filled 2024-02-10: qty 1

## 2024-02-10 MED ORDER — AMIODARONE HCL 200 MG PO TABS
200.0000 mg | ORAL_TABLET | Freq: Two times a day (BID) | ORAL | 0 refills | Status: AC
  Filled 2024-02-10: qty 180, 90d supply, fill #0

## 2024-02-10 NOTE — Progress Notes (Signed)
 " PROGRESS NOTE  Lonnie Hansen FMW:[id number] DOB: 30-Oct-1950   PCP: Yolande Toribio MATSU, MD  Patient is from: Home.  Lives with his wife.  DOA: 02/04/2024 LOS: 5  Chief complaints No chief complaint on file.    Brief Narrative / Interim history: 74 year old M with PMH of CAD/stent, AR s/p AVR, AAA s/p EVAR, HTN, ITP, BPH and osteoarthritis admitted by orthopedic surgery for right hip osteoarthritis and underwent THA on 1/9.  Postoperative course complicated by pancytopenia requiring blood transfusion.  He developed A-fib with RVR on 1/13.  Cardiology and hospitalist service consulted.  Patient was started on amiodarone  drip.  RVR resolved.  Patient was transition to p.o. amiodarone  and p.o. Cardizem  CD.  Blood counts relatively stable after blood transfusion.   Subjective: Seen and examined earlier this morning.  No major events overnight or this morning.  No complaints other than some pain in his right hip.  Denies chest pain, dizziness, shortness of breath or palpitation.  Assessment and plan: Unilateral primary osteoarthritis, right hip -S/p right THA by Dr. Vernetta on 1/9 -Management per orthopedic surgery.   Onset atrial fibrillation with RVR: A-fib seems to have resolved.  EKG with what looks like ectopic P wave.  Has regular rhythm on exam.  CHA2DS2-VASc score 3 (age, HTN and CAD).  TTE with LVEF of 50 to 55%, G1 DD, severe RAE, moderate MR, RVSP of 34 mmHg. -Cardiology recommended p.o. amiodarone  200 mg twice daily and p.o. Cardizem  CD 120 mg daily.  They are also arranging 14 days heart monitor.  -Not on anticoagulation due to pancytopenia/thrombocytopenia.  History of CAD s/p remote stent.  Denies cardiopulmonary symptoms. -Continue Crestor   Anemia/thrombocytopenia: History of ITP.  He is platelet is 120 on 1/5 and 76 on admission.  It seems he received 1 unit of platelet and 4 units of PRBC.  Platelets stable in 50s. Recent Labs    02/04/24 1323 02/05/24 0313  02/05/24 0519 02/06/24 0316 02/07/24 0317 02/08/24 0317 02/08/24 2328 02/09/24 0326 02/09/24 1233 02/10/24 0311  HGB 8.2* 7.8* 7.4* 8.1* 7.3* 8.9* 8.3* 8.1* 9.3* 8.5*  -Started on Epogen  by hematology which seems to be off now. - Defer to hematology.  Essential hypertension: No tensive off home amlodipine , atenolol , Hyzaar and HCT.  - Discontinued home medications - Started on Cardizem  CD 120 mg daily in the setting of A-fib.  - Reassess blood pressure and adjust meds as appropriate  CKD-3A: Creatinine stable and better. - Continue monitoring    Hyperlipidemia -Continue rosuvastatin  20 mg p.o. daily.  Elevated LFT: Mild. -Recheck CMP in 1 week   Glaucoma -Continue Xalatan  drops.   GERD -Continue pantoprazole  40 mg p.o. twice daily.  BPH - Continue tamsulosin   Body mass index is 26.83 kg/m.          DVT prophylaxis:  SCDs Start: 02/04/24 1404  Code Status: Full code Family Communication: Updated patient's wife at bedside Level of care: Stepdown Status is: Inpatient Remains inpatient appropriate because: Atrial fibrillation, hypotension   Final disposition: Per primary.  No barriers to discharge from our standpoint.  I have reconciled his cardiac and antihypertensive meds.   55 minutes with more than 50% spent in reviewing records, counseling patient/family and coordinating care.  Consultants:  Hospitalist Cardiology Oncology/hematology  Procedures: 1/9-right THA  Microbiology summarized: None  Objective: Vitals:   02/10/24 0800 02/10/24 0900 02/10/24 1000 02/10/24 1002  BP: 121/65 107/67    Pulse: 72 70 77   Resp: 10 15 13  Temp: 98.6 F (37 C)   98.8 F (37.1 C)  TempSrc: Oral   Oral  SpO2: 100% 100% 100%   Weight:      Height:        Examination:  GENERAL: No apparent distress.  Nontoxic. HEENT: MMM.  Vision and hearing grossly intact.  NECK: Supple.  No apparent JVD.  RESP:  No IWOB.  Fair aeration bilaterally. CVS:  RRR.  Heart sounds normal.  ABD/GI/GU: BS+. Abd soft, NTND.  MSK/EXT:  Moves extremities. No apparent deformity. No edema.  SKIN: Surgical dressing DCI. NEURO: AA.  Oriented appropriately.  No apparent focal neuro deficit. PSYCH: Calm. Normal affect.   Sch Meds:  Scheduled Meds:  amiodarone   200 mg Oral BID   Chlorhexidine  Gluconate Cloth  6 each Topical Daily   diltiazem   120 mg Oral Daily   docusate sodium   100 mg Oral BID   folic acid   2 mg Oral Daily   latanoprost   1 drop Both Eyes BID   multivitamin with minerals  1 tablet Oral Daily   pantoprazole   40 mg Oral BID   rosuvastatin   20 mg Oral Daily   tamsulosin   0.4 mg Oral QHS   Continuous Infusions:  sodium chloride  Stopped (02/05/24 1653)   PRN Meds:.acetaminophen , alum & mag hydroxide-simeth, diphenhydrAMINE , HYDROmorphone  (DILAUDID ) injection, menthol  **OR** phenol, methocarbamol  **OR** methocarbamol  (ROBAXIN ) injection, metoCLOPramide  **OR** metoCLOPramide  (REGLAN ) injection, ondansetron  **OR** ondansetron  (ZOFRAN ) IV, mouth rinse, oxyCODONE , oxyCODONE   Antimicrobials: Anti-infectives (From admission, onward)    Start     Dose/Rate Route Frequency Ordered Stop   02/04/24 1500  ceFAZolin  (ANCEF ) IVPB 2g/100 mL premix        2 g 200 mL/hr over 30 Minutes Intravenous Every 6 hours 02/04/24 1403 02/04/24 2051   02/04/24 0600  ceFAZolin  (ANCEF ) IVPB 2g/100 mL premix        2 g 200 mL/hr over 30 Minutes Intravenous On call to O.R. 02/04/24 9457 02/04/24 0716        I have personally reviewed the following labs and images: CBC: Recent Labs  Lab 02/05/24 0519 02/06/24 0316 02/07/24 0317 02/08/24 0317 02/08/24 2328 02/09/24 0326 02/09/24 1233 02/10/24 0311  WBC 12.2*   < > 9.3 8.6 8.9 9.1  --  7.8  NEUTROABS 9.6*  --   --  6.2  --  6.7  --  5.6  HGB 7.4*   < > 7.3* 8.9* 8.3* 8.1* 9.3* 8.5*  HCT 20.9*   < > 21.8* 25.5* 24.1* 22.6* 26.6* 25.7*  MCV 91.3   < > 91.6 90.4 90.9 88.6  --  90.8  PLT 62*   < > 43* 49* 56*  50*  --  51*   < > = values in this interval not displayed.   BMP &GFR Recent Labs  Lab 02/05/24 0313 02/08/24 0317 02/08/24 1556 02/09/24 0326 02/10/24 0311  NA 136 136  --  135 139  K 4.2 3.7  --  4.0 3.5  CL 102 100  --  100 103  CO2 21* 26  --  24 26  GLUCOSE 135* 107*  --  112* 104*  BUN 25* 19  --  19 19  CREATININE 1.64* 1.32*  --  1.29* 1.35*  CALCIUM  8.1* 8.7*  --  8.6* 8.7*  MG  --   --  1.9 2.1 2.1   Estimated Creatinine Clearance: 48 mL/min (A) (by C-G formula based on SCr of 1.35 mg/dL (H)). Liver & Pancreas: Recent Labs  Lab  02/08/24 0317 02/09/24 0326 02/10/24 0311  AST 60* 73* 65*  ALT 28 42 47*  ALKPHOS 41 42 46  BILITOT 1.1 0.9 1.2  PROT 5.9* 5.7* 5.8*  ALBUMIN  3.6 3.4* 3.5   No results for input(s): LIPASE, AMYLASE in the last 168 hours. No results for input(s): AMMONIA in the last 168 hours. Diabetic: No results for input(s): HGBA1C in the last 72 hours. No results for input(s): GLUCAP in the last 168 hours. Cardiac Enzymes: No results for input(s): CKTOTAL, CKMB, CKMBINDEX, TROPONINI in the last 168 hours. No results for input(s): PROBNP in the last 8760 hours. Coagulation Profile: No results for input(s): INR, PROTIME in the last 168 hours. Thyroid  Function Tests: No results for input(s): TSH, T4TOTAL, FREET4, T3FREE, THYROIDAB in the last 72 hours. Lipid Profile: No results for input(s): CHOL, HDL, LDLCALC, TRIG, CHOLHDL, LDLDIRECT in the last 72 hours. Anemia Panel: No results for input(s): VITAMINB12, FOLATE, FERRITIN, TIBC, IRON , RETICCTPCT in the last 72 hours.  Urine analysis:    Component Value Date/Time   COLORURINE COLORLESS (A) 08/21/2022 1628   APPEARANCEUR CLEAR 08/21/2022 1628   LABSPEC 1.008 08/21/2022 1628   PHURINE 5.0 08/21/2022 1628   GLUCOSEU NEGATIVE 08/21/2022 1628   HGBUR LARGE (A) 08/21/2022 1628   BILIRUBINUR NEGATIVE 08/21/2022 1628   KETONESUR  NEGATIVE 08/21/2022 1628   PROTEINUR NEGATIVE 08/21/2022 1628   UROBILINOGEN 0.2 04/27/2010 2330   NITRITE NEGATIVE 08/21/2022 1628   LEUKOCYTESUR NEGATIVE 08/21/2022 1628   Sepsis Labs: Invalid input(s): PROCALCITONIN, LACTICIDVEN  Microbiology: No results found for this or any previous visit (from the past 240 hours).  Radiology Studies: No results found.    Margaret Staggs T. Emmamarie Kluender [facility]  If 7PM-7AM, please contact night-coverage [email or web address] 02/10/2024, 12:24 PM   "

## 2024-02-10 NOTE — Progress Notes (Signed)
 Physical Therapy Treatment Patient Details Name: Lonnie Hansen MRN: [id number] DOB: 1950-09-29 Today's Date: 02/10/2024   History of Present Illness Pt s/p R THR and with hx of CAD, GIB, AAA, CAD, bil carotid stenosis, Aortic valve replacement and chronic ITP    PT Comments  POD # 6 post op RVR Pt seen in [facility] RM# [id number]  Pt feeling much better and at prior level of cognition.  Pt was self able to get OOB.  Pt tolerated amb a functional distance in the hallway.  General Gait Details: Increased tolerance to amb in hallway and to bathroom with a total of 115 feet (15 feet with walker + 100 feet with SPC per Pt request to try)  Pt did well amb with his cane in the hallway and prefers to use.  Instructed to use cane in the house but use walker for out and about.  Pt agreed. General transfer comment: increased self ability to stand from elevated bed as well as from recliner with <25% VC's on proper tech and hand placement.  Good safety awareness. Pt was able to safely navigate stairs to enter his home.  up/down 2 steps using one rail and his cane.  50% VC's on proper sequencing coming down due to slight anticipation. Pt requested to go to the bathroom for a BM.  Left Pt in bathroom and reported to RN. Pt has met his mobility golas to D/C to home with family support when medically cleared.  Walker was delivered during session. Pt set to [facility] HH [facility].   If plan is discharge home, recommend the following: A lot of help with walking and/or transfers;A lot of help with bathing/dressing/bathroom;Assistance with cooking/housework;Assist for transportation;Help with stairs or ramp for entrance   Can travel by private vehicle        Equipment Recommendations  Rolling walker (2 wheels)    Recommendations for Other Services       Precautions / Restrictions Precautions Precautions: Fall Precaution/Restrictions Comments: Thrombocytopenia/anemia + low platelets Restrictions Weight  Bearing Restrictions Per Provider Order: No RLE Weight Bearing Per Provider Order: Weight bearing as tolerated     Mobility  Bed Mobility Overal bed mobility: Needs Assistance Bed Mobility: Supine to Sit     Supine to sit: Supervision, Contact guard     General bed mobility comments: increased ability to self transition to EOB with increased time to complete scooting.    Transfers Overall transfer level: Needs assistance Equipment used: Rolling walker (2 wheels) Transfers: Sit to/from Stand Sit to Stand: Supervision, Contact guard assist           General transfer comment: increased self ability to stand from elevated bed as well as from recliner with <25% VC's on proper tech and hand placement.  Good safety awareness.    Ambulation/Gait Ambulation/Gait assistance: Supervision, Contact guard assist Gait Distance (Feet): 115 Feet Assistive device: Rolling walker (2 wheels) Gait Pattern/deviations: Step-to pattern, Decreased step length - right, Decreased step length - left, Shuffle, Trunk flexed Gait velocity: decr     General Gait Details: Increased tolerance to amb in hallway and to bathroom with a total of 115 feet (15 feet with walker + 100 feet with SPC per Pt request to try)  Pt did well amb with his cane in the hallway and prefers to use.  Instructed to use cane in the house but use walker for out and about.  Pt agreed.   Stairs Stairs: Yes Stairs assistance: Supervision, Contact guard assist Stair  Management: One rail Right, Step to pattern, Forwards, With cane Number of Stairs: 2 General stair comments: Pt was able to safely navigate up/down 2 steps using one rail and his cane.  50% VC's on proper sequencing coming down due to slight anticipation.   Wheelchair Mobility     Tilt Bed    Modified Rankin (Stroke Patients Only)       Balance                                            Communication Communication Communication: No  apparent difficulties  Cognition Arousal: Alert Behavior During Therapy: WFL for tasks assessed/performed   PT - Cognitive impairments: No apparent impairments                       PT - Cognition Comments: AxO x 3 feeling much better. Following commands: Intact      Cueing Cueing Techniques: Verbal cues  Exercises      General Comments        Pertinent Vitals/Pain Pain Assessment Pain Assessment: Faces Faces Pain Scale: Hurts a little bit Pain Location: R hip with activity Pain Descriptors / Indicators: Aching, Sore, Operative site guarding Pain Intervention(s): Monitored during session, Premedicated before session, Repositioned, Ice applied    Home Living                          Prior Function            PT Goals (current goals can now be found in the care plan section) Progress towards PT goals: Progressing toward goals    Frequency    7X/week      PT Plan      Co-evaluation              AM-PAC PT 6 Clicks Mobility   Outcome Measure  Help needed turning from your back to your side while in a flat bed without using bedrails?: None Help needed moving from lying on your back to sitting on the side of a flat bed without using bedrails?: None Help needed moving to and from a bed to a chair (including a wheelchair)?: None Help needed standing up from a chair using your arms (e.g., wheelchair or bedside chair)?: A Little Help needed to walk in hospital room?: A Little Help needed climbing 3-5 steps with a railing? : A Little 6 Click Score: 21    End of Session Equipment Utilized During Treatment: Gait belt Activity Tolerance: Patient tolerated treatment well Patient left: Other (comment) (in bathroom/on toilet for a BM/reported to RN) Nurse Communication: Mobility status PT Visit Diagnosis: Difficulty in walking, not elsewhere classified (R26.2);Muscle weakness (generalized) (M62.81);Pain Pain - Right/Left: Right Pain - part  of body: Hip     Time: 8697-8672 PT Time Calculation (min) (ACUTE ONLY): 25 min  Charges:    $Gait Training: 8-22 mins $Therapeutic Activity: 8-22 mins PT General Charges $$ ACUTE PT VISIT: 1 Visit                     Katheryn Leap  PTA Acute  Rehabilitation Services Office M-F          [phone]

## 2024-02-10 NOTE — Discharge Summary (Signed)
 Patient ID: Lonnie Hansen MRN: [id number] DOB/AGE: 04/01/1950 74 y.o.  Admit date: 02/04/2024 Discharge date: 02/10/2024  Admission Diagnoses:  Principal Problem:   Unilateral primary osteoarthritis, right hip Active Problems:   HTN (hypertension)   Hyperlipidemia   Glaucoma   GERD   Coronary artery disease   Chronic ITP (idiopathic thrombocytopenia) (HCC)   Status post total replacement of right hip   S/P hip replacement   PAF (paroxysmal atrial fibrillation) (HCC)   Discharge Diagnoses:  Same  Past Medical History:  Diagnosis Date   AAA (abdominal aortic aneurysm)    Thoraic   Anemia    Arthritis    Asymptomatic bilateral carotid artery stenosis 08/2015   1-39%    Cataracts, bilateral    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   Coronary artery disease    Stents x2   Diverticulosis    Enlarged aorta    Enlarged prostate    slightly   GERD (gastroesophageal reflux disease)    takes Pantoprazole  daily as needed   Glaucoma    uses eye drops daily   Headache    History of colon polyps    benign   History of kidney stones    Hyperlipidemia    no on any meds   Hypertension    takes Amlodipine  and Atenolol  daily   Idiopathic thrombocytopenia (HCC)    OSA on CPAP    Vocal cord nodule    pt. states  it's a growth on vocal cord    Surgeries: Procedures: ARTHROPLASTY, HIP, TOTAL, ANTERIOR APPROACH on 02/04/2024   Consultants: Treatment Team:  Timmy Maude SAUNDERS, MD Kathrin Mignon DASEN, MD  Discharged Condition: Improved  Hospital Course: Lonnie Hansen is an 74 y.o. male who was admitted 02/04/2024 for operative treatment ofUnilateral primary osteoarthritis, right hip. Patient has severe unremitting pain that affects sleep, daily activities, and work/hobbies. After pre-op clearance the patient was taken to the operating room on 02/04/2024 and underwent  Procedures: ARTHROPLASTY, HIP, TOTAL, ANTERIOR APPROACH.    Patient was given perioperative  antibiotics:  Anti-infectives (From admission, onward)    Start     Dose/Rate Route Frequency Ordered Stop   02/04/24 1500  ceFAZolin  (ANCEF ) IVPB 2g/100 mL premix        2 g 200 mL/hr over 30 Minutes Intravenous Every 6 hours 02/04/24 1403 02/04/24 2051   02/04/24 0600  ceFAZolin  (ANCEF ) IVPB 2g/100 mL premix        2 g 200 mL/hr over 30 Minutes Intravenous On call to O.R. 02/04/24 0542 02/04/24 0716        Patient was given sequential compression devices, early ambulation, and chemoprophylaxis to prevent DVT.  Inpatient Morphine  Milligram Equivalents Per Day 1/12 - 1/15   Values displayed are in units of MME/Day    Order Start / End Date 1/12 1/13 Yesterday Today    HYDROmorphone  (DILAUDID ) injection 0.5-1 mg 1/9 - No end date 0 of 60-120 0 of 60-120 0 of 60-120 0 of 60-120    oxyCODONE  (Oxy IR/ROXICODONE ) immediate release tablet 5-10 mg 1/9 - No end date 0 of 45-90 0 of 45-90 0 of 45-90 0 of 45-90    oxyCODONE  (Oxy IR/ROXICODONE ) immediate release tablet 10-15 mg 1/9 - No end date 0 of 90-135 0 of 90-135 0 of 90-135 0 of 90-135    Daily Totals  0 of 195-345 0 of 195-345 0 of 195-345 0 of 195-345       Patient benefited maximally from hospital stay.  When he is being discharged he reported some fatigue and felt his heart racing.  Nursing assessment found that he had gone into A-fib with RVR.  This did necessitate a consult from cardiology with Dr. Admission and a consult from [facility].  He was taken to stepdown unit and was converted with medications.  By the day of discharge he was stable and was cleared by cardiology for discharge.  He understands that he needs close follow-up with Dr. Timmy as well as cardiology and we will be seeing him back in 2 weeks.  Recent vital signs: Patient Vitals for the past 24 hrs:  BP Temp Temp src Pulse Resp SpO2  02/10/24 1200 135/61 98.3 F (36.8 C) Oral 66 14 100 %  02/10/24 1100 122/71 -- -- 71 17 100 %  02/10/24 1002 -- 98.8 F  (37.1 C) Oral -- -- --  02/10/24 1000 -- -- -- 77 13 100 %  02/10/24 0900 107/67 -- -- 70 15 100 %  02/10/24 0800 121/65 98.6 F (37 C) Oral 72 10 100 %  02/10/24 0750 127/63 -- -- 70 15 99 %  02/10/24 0700 (!) 135/57 -- -- 68 15 99 %  02/10/24 0600 129/61 -- -- 63 14 97 %  02/10/24 0530 (!) 102/45 -- -- 62 13 98 %  02/10/24 0400 128/69 -- -- 67 14 100 %  02/10/24 0328 -- -- -- 69 16 100 %  02/10/24 0312 -- 98.5 F (36.9 C) Oral -- -- --  02/10/24 0300 115/71 -- -- 69 (!) 29 100 %  02/10/24 0200 105/65 -- -- 61 16 99 %  02/10/24 0100 128/60 -- -- 69 15 100 %  02/10/24 0000 (!) 119/58 -- -- 72 19 100 %  02/09/24 2350 -- 98.3 F (36.8 C) Oral -- -- --  02/09/24 2300 (!) 112/57 -- -- 65 (!) 22 97 %  02/09/24 2200 (!) 140/74 -- -- 72 12 100 %  02/09/24 2100 (!) 109/57 -- -- 70 16 97 %  02/09/24 2000 100/60 -- -- 72 19 100 %  02/09/24 1929 -- 98.7 F (37.1 C) Axillary -- -- --     Recent laboratory studies:  Recent Labs    02/09/24 0326 02/09/24 1233 02/10/24 0311  WBC 9.1  --  7.8  HGB 8.1* 9.3* 8.5*  HCT 22.6* 26.6* 25.7*  PLT 50*  --  51*  NA 135  --  139  K 4.0  --  3.5  CL 100  --  103  CO2 24  --  26  BUN 19  --  19  CREATININE 1.29*  --  1.35*  GLUCOSE 112*  --  104*  CALCIUM  8.6*  --  8.7*     Discharge Medications:   Allergies as of 02/10/2024   No Known Allergies      Medication List     STOP taking these medications    amLODipine  10 MG tablet Commonly known as: NORVASC    atenolol  25 MG tablet Commonly known as: TENORMIN    losartan -hydrochlorothiazide  100-12.5 MG tablet Commonly known as: HYZAAR       TAKE these medications    amiodarone  200 MG tablet Commonly known as: PACERONE  Take 1 tablet (200 mg total) by mouth 2 (two) times daily.   diltiazem  120 MG 24 hr capsule Commonly known as: DILACOR XR  Take 1 capsule (120 mg total) by mouth daily. Start taking on: February 11, 2024   ENSURE PLUS PO Take 1 Bottle by mouth  3 (three)  times daily with meals.   fluticasone  50 MCG/ACT nasal spray Commonly known as: FLONASE  Place 1 spray into both nostrils daily as needed (congestion).   folic acid  1 MG tablet Commonly known as: FOLVITE  Take 2 tablets (2 mg total) by mouth daily.   hydrocortisone  2.5 % cream Apply 1 Application topically daily as needed (itching).   latanoprost  0.005 % ophthalmic solution Commonly known as: XALATAN  Place 1 drop into both eyes in the morning and at bedtime.   methocarbamol  500 MG tablet Commonly known as: ROBAXIN  Take 1 tablet (500 mg total) by mouth every 6 (six) hours as needed for muscle spasms.   multivitamin with minerals Tabs tablet Take 1 tablet by mouth daily.   NON FORMULARY Pt uses a cpap nightly   oxyCODONE  5 MG immediate release tablet Commonly known as: Oxy IR/ROXICODONE  Take 1-2 tablets (5-10 mg total) by mouth every 6 (six) hours as needed for moderate pain (pain score 4-6) (pain score 4-6). No more than 6 tablets daily.   pantoprazole  40 MG tablet Commonly known as: PROTONIX  Take 40 mg by mouth 2 (two) times daily.   rosuvastatin  20 MG tablet Commonly known as: CRESTOR  Take 20 mg by mouth daily.   tamsulosin  0.4 MG Caps capsule Commonly known as: FLOMAX  Take 0.4 mg by mouth at bedtime.               Durable Medical Equipment  (From admission, onward)           Start     Ordered   02/04/24 1404  DME 3 n 1  Once        02/04/24 1403   02/04/24 1404  DME Walker rolling  Once       Question Answer Comment  Walker: With 5 Inch Wheels   Patient needs a walker to treat with the following condition Status post total replacement of right hip      02/04/24 1403            Diagnostic Studies: ECHOCARDIOGRAM COMPLETE Result Date: 02/09/2024    ECHOCARDIOGRAM REPORT   Patient Name:   Lonnie Hansen Date of Exam: 02/09/2024 Medical Rec #:  [id number]      Height:       69.0 in Accession #:    [id number]     Weight:       181.7 lb Date of  Birth:  1950/09/12      BSA:          1.983 m Patient Age:    73 years       BP:           110/66 mmHg Patient Gender: M              HR:           69 bpm. Exam Location:  Inpatient Procedure: 2D Echo, Cardiac Doppler and Color Doppler (Both Spectral and Color            Flow Doppler were utilized during procedure). Indications:    I48.91* Unspeicified atrial fibrillation  History:        Patient has prior history of Echocardiogram examinations, most                 recent 09/16/2020. CAD, Aortic Valve Disease; Risk                 Factors:Hypertension, Dyslipidemia, Sleep Apnea and Former  Smoker. AVR.                 Aortic Valve: 29 mm Magna bioprosthetic valve is present in the                 aortic position. Procedure Date: 09/15/16.  Sonographer:    Ellouise Mose RDCS Referring Phys: [phone] MAUDE JAYSON EMMER  Sonographer Comments: Technically difficult study due to poor echo windows. IMPRESSIONS  1. Left ventricular ejection fraction, by estimation, is 50 to 55%. Left ventricular ejection fraction by 2D MOD biplane is 51.4 %. The left ventricle has normal function. The left ventricle has no regional wall motion abnormalities. The left ventricular internal cavity size was mildly dilated. There is mild left ventricular hypertrophy. Left ventricular diastolic parameters are consistent with Grade I diastolic dysfunction (impaired relaxation).  2. Right ventricular systolic function is mildly reduced. The right ventricular size is severely enlarged. There is normal pulmonary artery systolic pressure. The estimated right ventricular systolic pressure is 34.2 mmHg.  3. Right atrial size was severely dilated.  4. The mitral valve is abnormal. Moderate mitral valve regurgitation. No evidence of mitral stenosis. There is moderate late systolic prolapse of the middle scallop of the posterior leaflet of the mitral valve.  5. The aortic valve has been repaired/replaced. Aortic valve regurgitation is not visualized. No  aortic stenosis is present. There is a 29 mm Magna bioprosthetic valve present in the aortic position. Procedure Date: 09/15/16.  6. Aortic dilatation noted. There is mild dilatation of the ascending aorta, measuring 40 mm. There is mild dilatation of the aortic root, measuring 41 mm. 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  7. The inferior vena cava is dilated in size with <50% respiratory variability, suggesting right atrial pressure of 15 mmHg. FINDINGS  Left Ventricle: Left ventricular ejection fraction, by estimation, is 50 to 55%. Left ventricular ejection fraction by 2D MOD biplane is 51.4 %. The left ventricle has normal function. The left ventricle has no regional wall motion abnormalities. The left ventricular internal cavity size was mildly dilated. There is mild left ventricular hypertrophy. Abnormal (paradoxical) septal motion, consistent with left bundle branch block. Left ventricular diastolic parameters are consistent with Grade I diastolic dysfunction (impaired relaxation). Right Ventricle: The right ventricular size is severely enlarged. No increase in right ventricular wall thickness. Right ventricular systolic function is mildly reduced. There is normal pulmonary artery systolic pressure. The tricuspid regurgitant velocity is 2.19 m/s, and with an assumed right atrial pressure of 15 mmHg, the estimated right ventricular systolic pressure is 34.2 mmHg. Left Atrium: Left atrial size was normal in size. Right Atrium: Right atrial size was severely dilated. Pericardium: There is no evidence of pericardial effusion. Mitral Valve: The mitral valve is abnormal. There is moderate late systolic prolapse of the middle scallop of the posterior leaflet of the mitral valve. Mild mitral annular calcification. Moderate mitral valve regurgitation. No evidence of mitral valve stenosis. Tricuspid Valve: The tricuspid valve is normal in structure. Tricuspid valve regurgitation is trivial. No  evidence of tricuspid stenosis. Aortic Valve: The aortic valve has been repaired/replaced. Aortic valve regurgitation is not visualized. No aortic stenosis is present. Aortic valve mean gradient measures 9.0 mmHg. Aortic valve peak gradient measures 17.4 mmHg. Aortic valve area, by VTI  measures 3.64 cm. There is a 29 mm Magna bioprosthetic valve present in the aortic position. Procedure Date: 09/15/16. Pulmonic Valve: The pulmonic valve was normal in structure. Pulmonic valve regurgitation is  trivial. No evidence of pulmonic stenosis. Aorta: Aortic dilatation noted. There is mild dilatation of the ascending aorta, measuring 40 mm. There is mild dilatation of the aortic root, measuring 41 mm. 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 Venous: The inferior vena cava is dilated in size with less than 50% respiratory variability, suggesting right atrial pressure of 15 mmHg. IAS/Shunts: No atrial level shunt detected by color flow Doppler.  LEFT VENTRICLE PLAX 2D                        Biplane EF (MOD) LVIDd:         5.50 cm         LV Biplane EF:   Left LVIDs:         3.60 cm                          ventricular LV PW:         1.10 cm                          ejection LV IVS:        1.30 cm                          fraction by LVOT diam:     2.60 cm                          2D MOD LV SV:         135                              biplane is LV SV Index:   68                               51.4 %. LVOT Area:     5.31 cm                                Diastology                                LV e' medial:    6.31 cm/s LV Volumes (MOD)               LV E/e' medial:  17.1 LV vol d, MOD    152.0 ml      LV e' lateral:   12.65 cm/s A2C:                           LV E/e' lateral: 8.5 LV vol d, MOD    98.8 ml A4C: LV vol s, MOD    88.2 ml A2C: LV vol s, MOD    45.9 ml A4C: LV SV MOD A2C:   63.8 ml LV SV MOD A4C:   98.8 ml LV SV MOD BP:    67.0 ml RIGHT VENTRICLE            IVC RV S prime:     9.90 cm/s  IVC  diam: 2.70 cm TAPSE (M-mode): 1.9 cm  LEFT ATRIUM           Index       RIGHT ATRIUM           Index LA diam:      3.00 cm 1.51 cm/m  RA Area:     26.60 cm LA Vol (A2C): 16.0 ml 8.07 ml/m  RA Volume:   91.70 ml  46.24 ml/m LA Vol (A4C): 16.4 ml 8.27 ml/m  AORTIC VALVE AV Area (Vmax):    3.61 cm AV Area (Vmean):   3.38 cm AV Area (VTI):     3.64 cm AV Vmax:           208.67 cm/s AV Vmean:          135.000 cm/s AV VTI:            0.372 m AV Peak Grad:      17.4 mmHg AV Mean Grad:      9.0 mmHg LVOT Vmax:         142.00 cm/s LVOT Vmean:        85.900 cm/s LVOT VTI:          0.255 m LVOT/AV VTI ratio: 0.69  AORTA Ao Root diam: 4.10 cm Ao Asc diam:  4.70 cm MITRAL VALVE                  TRICUSPID VALVE MV Area (PHT): 4.49 cm       TR Peak grad:   19.2 mmHg MV Decel Time: 169 msec       TR Vmax:        219.00 cm/s MR Peak grad:    106.1 mmHg MR Mean grad:    62.0 mmHg    SHUNTS MR Vmax:         515.00 cm/s  Systemic VTI:  0.26 m MR Vmean:        371.0 cm/s   Systemic Diam: 2.60 cm MR PISA:         4.02 cm MR PISA Eff ROA: 20 mm MR PISA Radius:  0.80 cm MV E velocity: 108.00 cm/s MV A velocity: 76.40 cm/s MV E/A ratio:  1.41 Redell Shallow MD Electronically signed by Redell Shallow MD Signature Date/Time: 02/09/2024/12:53:50 PM    Final    DG Pelvis Portable Result Date: 02/04/2024 CLINICAL DATA:  Status post right hip replacement. EXAM: PORTABLE PELVIS 1-2 VIEWS COMPARISON:  Radiographs 08/16/2023 FINDINGS: Right hip arthroplasty in expected alignment. No periprosthetic lucency or fracture. Recent postsurgical change includes air and edema in the soft tissues. Overlying skin staples in place. Probable vascular closure device projects over the medial hip, unchanged from preoperative exam. Vascular coils and stents in the pelvis. Probable external artifact projecting over the central pelvis. IMPRESSION: Right hip arthroplasty without immediate postoperative complication. Electronically Signed   By: Andrea Gasman M.D.   On: 02/04/2024 15:06   DG HIP UNILAT WITH PELVIS 1V RIGHT Result Date: 02/04/2024 CLINICAL DATA:  Elective surgery. EXAM: DG HIP (WITH OR WITHOUT PELVIS) 1V RIGHT COMPARISON:  Preoperative imaging FINDINGS: Nine fluoroscopic spot views of the pelvis and right hip obtained in the operating room. Sequential images during hip arthroplasty. Fluoroscopy time 19 seconds. Dose 2.5 mGy. IMPRESSION: Intraoperative fluoroscopy during right hip arthroplasty. Electronically Signed   By: Andrea Gasman M.D.   On: 02/04/2024 15:05   DG C-Arm 1-60 Min-No Report Result Date: 02/04/2024 Fluoroscopy was utilized by the requesting physician.  No radiographic interpretation.   CT ANGIO CHEST  AORTA W/CM & OR WO/CM Result Date: 01/12/2024 CLINICAL DATA:  Thoracic aortic aneurysm surveillance. EXAM: CT ANGIOGRAPHY CHEST WITH CONTRAST TECHNIQUE: Multidetector CT imaging of the chest was performed using the standard protocol during bolus administration of intravenous contrast. Multiplanar CT image reconstructions and MIPs were obtained to evaluate the vascular anatomy. RADIATION DOSE REDUCTION: This exam was performed according to the departmental dose-optimization program which includes automated exposure control, adjustment of the mA and/or kV according to patient size and/or use of iterative reconstruction technique. CONTRAST:  OMNIPAQUE  IOHEXOL  350 MG/ML SOLN COMPARISON:  07/14/2023, 12/21/2022, 11/03/2022 FINDINGS: Cardiovascular: Median sternotomy wires are present. Heart is normal size. Calcified plaque over the left main, left anterior descending and lateral circumflex coronary arteries. Continued evidence of aneurysm of the ascending thoracic aorta measuring 5.2 cm without significant change. 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 Aortic root at the sinuses of Valsalva measures 4.4 cm and sinotubular junction measures 3.2 cm. Aortic valve in place. Great vessel takeoff  from the aortic arch unchanged. Proximal aortic arch 4.9 cm. Posterior arch 3.9 cm. Descending thoracic aorta 3.4 cm. Pulmonary arterial system and remaining vascular structures are unremarkable. Mediastinum/Nodes: No mediastinal or hilar adenopathy. Remaining mediastinal structures are normal. Lungs/Pleura: Lungs are adequately inflated without acute airspace consolidation or effusion. Mild opacification over the lingula and adjacent left lower lobe which may be due to atelectasis or infection. Airways are normal. Upper Abdomen: Previous cholecystectomy. Subcentimeter liver hypodensities unchanged and likely cysts or hemangiomas. Cystic change of the kidneys which is stable. Moderate calcification over the left upper pole renal cortex unchanged. Partially visualized infrarenal aortic stent graft unchanged. No acute findings. Musculoskeletal: Unchanged. Review of the MIP images confirms the above findings. IMPRESSION: 1. 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 Recommend semi-annual imaging followup by CTA or MRA and referral to cardiothoracic surgery if not already obtained. This recommendation follows 2010 ACCF/AHA/AATS/ACR/ASA/SCA/SCAI/SIR/STS/SVM Guidelines for the Diagnosis and Management of Patients With Thoracic Aortic Disease. Circulation. 2010; 121: Z733-z630. Aortic aneurysm NOS (ICD10-I71.9). 2. Mild opacification over the lingula and adjacent left lower lobe which may be due to atelectasis or infection. 3. Aortic atherosclerosis. Atherosclerotic coronary artery disease. 4. Stable subcentimeter liver hypodensities likely cysts or hemangiomas. 5. Stable cystic change of the kidneys. 6. Partially visualized infrarenal aortic stent graft unchanged. Aortic Atherosclerosis (ICD10-I70.0). Electronically Signed   By: Toribio Agreste M.D.   On: 01/12/2024 10:02    Disposition: Discharge disposition: 01-Home or Self  Care       Discharge Instructions     Electrocardiogram report   Complete by: As directed         Contact information for follow-up providers     Vernetta Lonni GRADE, MD. Go on 02/17/2024.   Specialty: Orthopedic Surgery Why: at 1:15 pm for your first post operative appointment in our office with Dr. Vernetta Pass information: [address] [place] [address] [phone]         [facility]) Follow up.   Specialty: Home Health Services Why: This provider will reach out to you 24-48 hours after discharge to begin [facility] PT services. Contact information: [address] [place]  [id number] [phone]             Contact information for after-discharge care     Durable Medical Equipment     [facility] (DME) .   Service: Durable Medical  Museum[employer] Exhib[employer] Officer information: [address] [place]  [address] [phone]                      Signed: [name] 02/10/2024, 4:28 PM

## 2024-02-10 NOTE — Progress Notes (Signed)
 Patient ID: Lonnie Hansen, male   DOB: Apr 13, 1950, 74 y.o.   MRN: [id number] The patient is awake and alert this morning.  Physical therapy does plan to come and see the patient.  His vital signs are stable.  I did see the notes from cardiology and Dr. Delford who states that it is okay to mobilize the patient and ambulate him today and that if he is doing well he can go home today.  We will put in for discharge this afternoon if he is mobilizing well.

## 2024-02-10 NOTE — Progress Notes (Unsigned)
Enrolled for [employer] to mail a ZIO AT Live Telemetry monitor to patients address on file.   Dr [place] to read.

## 2024-02-10 NOTE — Progress Notes (Signed)
" °   02/10/24 0328  BiPAP/CPAP/SIPAP  $ Non-Invasive Home Ventilator  Initial  BiPAP/CPAP/SIPAP Pt Type Adult  BiPAP/CPAP/SIPAP Resmed  Mask Type Nasal mask  Dentures removed? Not applicable  Mask Size Small  Respiratory Rate 16 breaths/min  Flow Rate 2 lpm  Patient Home Machine No  Patient Home Mask No  Patient Home Tubing No  Auto Titrate Yes  Minimum cmH2O 5 cmH2O  Maximum cmH2O 20 cmH2O  CPAP/SIPAP surface wiped down Yes  Device Plugged into RED Power Outlet Yes  BiPAP/CPAP /SiPAP Vitals  Pulse Rate 69  Resp 16  SpO2 100 %  MEWS Score/Color  MEWS Score 0  MEWS Score Color Green   Pt decided to go on CPAP "

## 2024-02-10 NOTE — Plan of Care (Signed)
   Problem: Education: Goal: Knowledge of disease or condition will improve Outcome: Progressing Goal: Understanding of medication regimen will improve Outcome: Progressing

## 2024-02-10 NOTE — TOC Transition Note (Signed)
 Transition of Care [facility]) - Discharge Note   Patient Details  Name: Lonnie Hansen MRN: [id number] Date of Birth: Oct 16, 1950  [facility] CM/SW Contact:  Jon ONEIDA Anon, RN Phone Number: 02/10/2024, 12:14 PM   Clinical Narrative:    RNCM met with pt at bedside to discuss need for RW. Pt agreeable to [facility] sending orders to DME company for RW. Orders sent to [employer] and RW to be delivered to pt room prior to discharge. Pt will discharge home with [facility] services through [facility]. Pt family to provide transportation at discharge. No further ICM needs identified at this time. ICM will sign off.    Final next level of care: Home w Home Health Services Barriers to Discharge: Barriers Resolved   Patient Goals and CMS Choice Patient states their goals for this hospitalization and ongoing recovery are:: To return home with [facility] services CMS Medicare.gov Compare Post Acute Care list provided to:: Patient Choice offered to / list presented to : Patient [place] ownership interest in [facility].provided to:: Patient    Discharge Placement                Patient to be transferred to facility by: Family Name of family member notified: [name], Emergency Contact  [phone] Patient and family notified of of transfer: 02/10/24  Discharge Plan and Services Additional resources added to the After Visit Summary for                  DME Arranged: Walker rolling DME Agency: [employer] Date DME Agency Contacted: 02/10/24 Time DME Agency Contacted: [id number] Representative spoke with at DME Agency: [place] with [employer] HH Arranged: PT HH Agency: [facility] Date HH Agency Contacted: 02/05/24 Time HH Agency Contacted: 1017 Representative spoke with at [facility] Agency: Prearranged at [employer] office  Social Drivers of Health (SDOH) Interventions SDOH Screenings   Food Insecurity: No Food Insecurity (02/04/2024)  Housing: Low Risk (02/04/2024)   Transportation Needs: No Transportation Needs (02/04/2024)  Utilities: Not At Risk (02/04/2024)  Depression (PHQ2-9): Low Risk (01/17/2024)  Social Connections: Socially Integrated (02/04/2024)  Tobacco Use: Medium Risk (02/04/2024)     Readmission Risk Interventions    10/09/2022   12:47 PM 08/19/2021   11:21 AM  Readmission Risk Prevention Plan  Post Dischage Appt Complete Complete  Medication Screening Complete Complete  Transportation Screening Complete Complete

## 2024-02-10 NOTE — Progress Notes (Signed)
 "  Cardiology:  Jordan  Subjective:  Denies SSCP, palpitations or Dyspnea Maintaining NSR Ok to d/c home   Objective:  Vitals:   02/10/24 0600 02/10/24 0700 02/10/24 0750 02/10/24 0800  BP: 129/61 (!) 135/57 127/63 121/65  Pulse: 63 68 70 72  Resp: 14 15 15 10   Temp:      TempSrc:      SpO2: 97% 99% 99% 100%  Weight:      Height:        Intake/Output from previous day:  Intake/Output Summary (Last 24 hours) at 02/10/2024 0914 Last data filed at 02/10/2024 9166 Gross per 24 hour  Intake 314.04 ml  Output 1250 ml  Net -935.96 ml    Physical Exam: Affect appropriate Healthy:  appears stated age HEENT: normal Neck supple with no adenopathy JVP normal no bruits no thyromegaly Lungs clear with no wheezing and good diaphragmatic motion Heart:  S1/S2 SEM through AVR no AR murmur, no rub, gallop or click PMI normal  Prior sternotomy  Abdomen: benighn, BS positve, no tenderness, no AAA no bruit.  No HSM or HJR Distal pulses intact with no bruits Post right THR    Lab Results: Basic Metabolic Panel: Recent Labs    02/09/24 0326 02/10/24 0311  NA 135 139  K 4.0 3.5  CL 100 103  CO2 24 26  GLUCOSE 112* 104*  BUN 19 19  CREATININE 1.29* 1.35*  CALCIUM  8.6* 8.7*  MG 2.1 2.1   Liver Function Tests: Recent Labs    02/09/24 0326 02/10/24 0311  AST 73* 65*  ALT 42 47*  ALKPHOS 42 46  BILITOT 0.9 1.2  PROT 5.7* 5.8*  ALBUMIN  3.4* 3.5   No results for input(s): LIPASE, AMYLASE in the last 72 hours. CBC: Recent Labs    02/09/24 0326 02/09/24 1233 02/10/24 0311  WBC 9.1  --  7.8  NEUTROABS 6.7  --  5.6  HGB 8.1* 9.3* 8.5*  HCT 22.6* 26.6* 25.7*  MCV 88.6  --  90.8  PLT 50*  --  51*    Anemia Panel: No results for input(s): VITAMINB12, FOLATE, FERRITIN, TIBC, IRON , RETICCTPCT in the last 72 hours.   Imaging: ECHOCARDIOGRAM COMPLETE Result Date: 02/09/2024    ECHOCARDIOGRAM REPORT   Patient Name:   Lonnie Hansen Date of Exam:  02/09/2024 Medical Rec #:  [id number]      Height:       69.0 in Accession #:    [id number]     Weight:       181.7 lb Date of Birth:  09/26/1950      BSA:          1.983 m Patient Age:    73 years       BP:           110/66 mmHg Patient Gender: M              HR:           69 bpm. Exam Location:  Inpatient Procedure: 2D Echo, Cardiac Doppler and Color Doppler (Both Spectral and Color            Flow Doppler were utilized during procedure). Indications:    I48.91* Unspeicified atrial fibrillation  History:        Patient has prior history of Echocardiogram examinations, most                 recent 09/16/2020. CAD, Aortic Valve Disease; Risk  Factors:Hypertension, Dyslipidemia, Sleep Apnea and Former                 Smoker. AVR.                 Aortic Valve: 29 mm Magna bioprosthetic valve is present in the                 aortic position. Procedure Date: 09/15/16.  Sonographer:    Ellouise Mose RDCS Referring Phys: [phone] MAUDE JAYSON EMMER  Sonographer Comments: Technically difficult study due to poor echo windows. IMPRESSIONS  1. Left ventricular ejection fraction, by estimation, is 50 to 55%. Left ventricular ejection fraction by 2D MOD biplane is 51.4 %. The left ventricle has normal function. The left ventricle has no regional wall motion abnormalities. The left ventricular internal cavity size was mildly dilated. There is mild left ventricular hypertrophy. Left ventricular diastolic parameters are consistent with Grade I diastolic dysfunction (impaired relaxation).  2. Right ventricular systolic function is mildly reduced. The right ventricular size is severely enlarged. There is normal pulmonary artery systolic pressure. The estimated right ventricular systolic pressure is 34.2 mmHg.  3. Right atrial size was severely dilated.  4. The mitral valve is abnormal. Moderate mitral valve regurgitation. No evidence of mitral stenosis. 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  5. The aortic valve has been repaired/replaced. Aortic valve regurgitation is not visualized. No aortic stenosis is present. There is a 29 mm Magna bioprosthetic valve present in the aortic position. Procedure Date: 09/15/16.  6. Aortic dilatation noted. There is mild dilatation of the ascending aorta, measuring 40 mm. There is mild dilatation of the aortic root, measuring 41 mm. 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  7. The inferior vena cava is dilated in size with <50% respiratory variability, suggesting right atrial pressure of 15 mmHg. FINDINGS  Left Ventricle: Left ventricular ejection fraction, by estimation, is 50 to 55%. Left ventricular ejection fraction by 2D MOD biplane is 51.4 %. The left ventricle has normal function. The left ventricle has no regional wall motion abnormalities. The left ventricular internal cavity size was mildly dilated. There is mild left ventricular hypertrophy. Abnormal (paradoxical) septal motion, consistent with left bundle branch block. Left ventricular diastolic parameters are consistent with Grade I diastolic dysfunction (impaired relaxation). Right Ventricle: The right ventricular size is severely enlarged. No increase in right ventricular wall thickness. Right ventricular systolic function is mildly reduced. There is normal pulmonary artery systolic pressure. The tricuspid regurgitant velocity is 2.19 m/s, and with an assumed right atrial pressure of 15 mmHg, the estimated right ventricular systolic pressure is 34.2 mmHg. Left Atrium: Left atrial size was normal in size. Right Atrium: Right atrial size was severely dilated. Pericardium: There is no evidence of pericardial effusion. Mitral Valve: The mitral valve is abnormal. There is moderate late systolic prolapse of the middle scallop of the posterior leaflet of the mitral valve. Mild mitral annular calcification. Moderate mitral valve regurgitation. No evidence of  mitral valve stenosis. Tricuspid Valve: The tricuspid valve is normal in structure. Tricuspid valve regurgitation is trivial. No evidence of tricuspid stenosis. Aortic Valve: The aortic valve has been repaired/replaced. Aortic valve regurgitation is not visualized. No aortic stenosis is present. Aortic valve mean gradient measures 9.0 mmHg. Aortic valve peak gradient measures 17.4 mmHg. Aortic valve area, by VTI  measures 3.64 cm. There is a 29 mm Magna bioprosthetic  valve present in the aortic position. Procedure Date: 09/15/16. Pulmonic Valve: The pulmonic valve was normal in structure. Pulmonic valve regurgitation is trivial. No evidence of pulmonic stenosis. Aorta: Aortic dilatation noted. There is mild dilatation of the ascending aorta, measuring 40 mm. There is mild dilatation of the aortic root, measuring 41 mm. 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 Venous: The inferior vena cava is dilated in size with less than 50% respiratory variability, suggesting right atrial pressure of 15 mmHg. IAS/Shunts: No atrial level shunt detected by color flow Doppler.  LEFT VENTRICLE PLAX 2D                        Biplane EF (MOD) LVIDd:         5.50 cm         LV Biplane EF:   Left LVIDs:         3.60 cm                          ventricular LV PW:         1.10 cm                          ejection LV IVS:        1.30 cm                          fraction by LVOT diam:     2.60 cm                          2D MOD LV SV:         135                              biplane is LV SV Index:   68                               51.4 %. LVOT Area:     5.31 cm                                Diastology                                LV e' medial:    6.31 cm/s LV Volumes (MOD)               LV E/e' medial:  17.1 LV vol d, MOD    152.0 ml      LV e' lateral:   12.65 cm/s A2C:                           LV E/e' lateral: 8.5 LV vol d, MOD    98.8 ml A4C: LV vol s, MOD    88.2 ml A2C: LV vol s, MOD    45.9 ml A4C: LV SV  MOD A2C:   63.8 ml LV SV MOD A4C:   98.8 ml LV SV MOD BP:    67.0 ml RIGHT VENTRICLE  IVC RV S prime:     9.90 cm/s  IVC diam: 2.70 cm TAPSE (M-mode): 1.9 cm LEFT ATRIUM           Index       RIGHT ATRIUM           Index LA diam:      3.00 cm 1.51 cm/m  RA Area:     26.60 cm LA Vol (A2C): 16.0 ml 8.07 ml/m  RA Volume:   91.70 ml  46.24 ml/m LA Vol (A4C): 16.4 ml 8.27 ml/m  AORTIC VALVE AV Area (Vmax):    3.61 cm AV Area (Vmean):   3.38 cm AV Area (VTI):     3.64 cm AV Vmax:           208.67 cm/s AV Vmean:          135.000 cm/s AV VTI:            0.372 m AV Peak Grad:      17.4 mmHg AV Mean Grad:      9.0 mmHg LVOT Vmax:         142.00 cm/s LVOT Vmean:        85.900 cm/s LVOT VTI:          0.255 m LVOT/AV VTI ratio: 0.69  AORTA Ao Root diam: 4.10 cm Ao Asc diam:  4.70 cm MITRAL VALVE                  TRICUSPID VALVE MV Area (PHT): 4.49 cm       TR Peak grad:   19.2 mmHg MV Decel Time: 169 msec       TR Vmax:        219.00 cm/s MR Peak grad:    106.1 mmHg MR Mean grad:    62.0 mmHg    SHUNTS MR Vmax:         515.00 cm/s  Systemic VTI:  0.26 m MR Vmean:        371.0 cm/s   Systemic Diam: 2.60 cm MR PISA:         4.02 cm MR PISA Eff ROA: 20 mm MR PISA Radius:  0.80 cm MV E velocity: 108.00 cm/s MV A velocity: 76.40 cm/s MV E/A ratio:  1.41 Redell Shallow MD Electronically signed by Redell Shallow MD Signature Date/Time: 02/09/2024/12:53:50 PM    Final     Cardiac Studies:  ECG: yesterday afib pending this am   Telemetry:  NSR rates 65-72 bpm  Echo: EF 50-55% moderate MR normal 29 mm AVR  Medications:    amiodarone   200 mg Oral BID   Chlorhexidine  Gluconate Cloth  6 each Topical Daily   docusate sodium   100 mg Oral BID   folic acid   2 mg Oral Daily   latanoprost   1 drop Both Eyes BID   multivitamin with minerals  1 tablet Oral Daily   pantoprazole   40 mg Oral BID   rosuvastatin   20 mg Oral Daily   tamsulosin   0.4 mg Oral QHS      sodium chloride  Stopped (02/05/24 1653)     Assessment/Plan:   PAF:  quick conversion with cardizem  and amiodarone . Echo with normal AVR and low normal EF No anticoagulation for now given recent surgery and cytopenia. 14 day live monitor change to LA cardizem  and continue amiodarone  200 mg  Cytopenia.  Getting another transfusion for Hct 22.6 has had Procrit .  PLT 50 and has had Nplat THR:  ok to  ambulate and do normal PT/OT today  D/C home today  Maude Emmer 02/10/2024, 9:14 AM    "

## 2024-02-17 ENCOUNTER — Encounter: Payer: Self-pay | Admitting: Orthopaedic Surgery

## 2024-02-17 ENCOUNTER — Ambulatory Visit (INDEPENDENT_AMBULATORY_CARE_PROVIDER_SITE_OTHER): Admitting: Orthopaedic Surgery

## 2024-02-17 DIAGNOSIS — Z96641 Presence of right artificial hip joint: Secondary | ICD-10-CM

## 2024-02-17 NOTE — Progress Notes (Signed)
 The patient is a 74 year old gentleman who is here for his first postoperative visit status post a right total hip arthroplasty.  He has someone with chronic thrombocytopenia and is followed close by Dr. Timmy.  He did require assistance from Dr. Timmy during his hospitalization with significant low platelets and significant postoperative anemia that required some transfusions.  Prior to his discharge he did go into A-fib with RVR and this did necessitate a consultation from Dr. Delford and cardiology.  That stabilized prior to discharge and he was mobilizing well to be discharged to home.  This is his first postoperative visit since then.  On exam he seems to be doing well.  His right hip incision looks good.  Staples are removed and Steri-Strips applied.  He will slowly continue to increase his activities as comfort allows.  We would like to see him back in a month to see how he is doing from a mobility standpoint but no x-rays are needed.  If there are issues before then he knows to reach out.

## 2024-02-21 ENCOUNTER — Inpatient Hospital Stay

## 2024-02-21 ENCOUNTER — Inpatient Hospital Stay: Admitting: Family

## 2024-02-22 ENCOUNTER — Inpatient Hospital Stay

## 2024-02-22 ENCOUNTER — Inpatient Hospital Stay: Admitting: Family

## 2024-02-22 VITALS — BP 142/84 | HR 81 | Temp 98.1°F | Resp 17 | Ht 69.0 in | Wt 190.0 lb

## 2024-02-22 DIAGNOSIS — I4891 Unspecified atrial fibrillation: Secondary | ICD-10-CM | POA: Diagnosis not present

## 2024-02-22 DIAGNOSIS — D693 Immune thrombocytopenic purpura: Secondary | ICD-10-CM | POA: Diagnosis not present

## 2024-02-22 DIAGNOSIS — Z79899 Other long term (current) drug therapy: Secondary | ICD-10-CM | POA: Diagnosis not present

## 2024-02-22 DIAGNOSIS — Z96641 Presence of right artificial hip joint: Secondary | ICD-10-CM | POA: Diagnosis not present

## 2024-02-22 DIAGNOSIS — D509 Iron deficiency anemia, unspecified: Secondary | ICD-10-CM

## 2024-02-22 LAB — CBC WITH DIFFERENTIAL (CANCER CENTER ONLY)
Abs Immature Granulocytes: 0.1 10*3/uL — ABNORMAL HIGH (ref 0.00–0.07)
Basophils Absolute: 0 10*3/uL (ref 0.0–0.1)
Basophils Relative: 0 %
Eosinophils Absolute: 0.1 10*3/uL (ref 0.0–0.5)
Eosinophils Relative: 2 %
HCT: 34.1 % — ABNORMAL LOW (ref 39.0–52.0)
Hemoglobin: 11.1 g/dL — ABNORMAL LOW (ref 13.0–17.0)
Immature Granulocytes: 1 %
Lymphocytes Relative: 11 %
Lymphs Abs: 0.8 10*3/uL (ref 0.7–4.0)
MCH: 31.6 pg (ref 26.0–34.0)
MCHC: 32.6 g/dL (ref 30.0–36.0)
MCV: 97.2 fL (ref 80.0–100.0)
Monocytes Absolute: 0.9 10*3/uL (ref 0.1–1.0)
Monocytes Relative: 13 %
Neutro Abs: 5.1 10*3/uL (ref 1.7–7.7)
Neutrophils Relative %: 73 %
Platelet Count: 114 10*3/uL — ABNORMAL LOW (ref 150–400)
RBC: 3.51 MIL/uL — ABNORMAL LOW (ref 4.22–5.81)
RDW: 18.1 % — ABNORMAL HIGH (ref 11.5–15.5)
WBC Count: 7 10*3/uL (ref 4.0–10.5)
nRBC: 0 % (ref 0.0–0.2)

## 2024-02-22 LAB — CMP (CANCER CENTER ONLY)
ALT: 20 U/L (ref 0–44)
AST: 34 U/L (ref 15–41)
Albumin: 4.3 g/dL (ref 3.5–5.0)
Alkaline Phosphatase: 64 U/L (ref 38–126)
Anion gap: 12 (ref 5–15)
BUN: 17 mg/dL (ref 8–23)
CO2: 24 mmol/L (ref 22–32)
Calcium: 9.4 mg/dL (ref 8.9–10.3)
Chloride: 102 mmol/L (ref 98–111)
Creatinine: 1.4 mg/dL — ABNORMAL HIGH (ref 0.61–1.24)
GFR, Estimated: 53 mL/min — ABNORMAL LOW
Glucose, Bld: 99 mg/dL (ref 70–99)
Potassium: 4.4 mmol/L (ref 3.5–5.1)
Sodium: 138 mmol/L (ref 135–145)
Total Bilirubin: 1.3 mg/dL — ABNORMAL HIGH (ref 0.0–1.2)
Total Protein: 7.8 g/dL (ref 6.5–8.1)

## 2024-02-22 LAB — IRON AND IRON BINDING CAPACITY (CC-WL,HP ONLY)
Iron: 81 ug/dL (ref 45–182)
Saturation Ratios: 30 % (ref 17.9–39.5)
TIBC: 269 ug/dL (ref 250–450)
UIBC: 188 ug/dL

## 2024-02-22 LAB — FERRITIN: Ferritin: 805 ng/mL — ABNORMAL HIGH (ref 24–336)

## 2024-02-22 MED ORDER — ROMIPLOSTIM 125 MCG ~~LOC~~ SOLR
1.0000 ug/kg | Freq: Once | SUBCUTANEOUS | Status: AC
  Administered 2024-02-22: 85 ug via SUBCUTANEOUS
  Filled 2024-02-22: qty 0.17

## 2024-02-22 MED ORDER — ROMIPLOSTIM 125 MCG ~~LOC~~ SOLR: 1.0000 ug/kg | Freq: Once | SUBCUTANEOUS | Status: DC

## 2024-02-22 NOTE — Progress Notes (Signed)
 " Hematology and Oncology Follow Up Visit  Lonnie Hansen [id number] 1950/05/09 74 y.o. 02/22/2024   Principle Diagnosis:  Chronic ITP  Current Therapy:   Nplate  for platelets below 100,000    Interim History:  Lonnie Hansen is here today for follow-up. Platelets are 114 and Hgb 11.1.  He had his right total hip replacement on 02/04/2024 and had an extended admission due to atrial fib with RVR felt to be due to post surgery anemia. He converted with the use of medications and is doing well now that he is back home. He has some bruising down the right thigh and hip that appears to be healing nicely.  He denies any blood loss. No petechiae.  No fever, chills, n/v, cough, rash, dizziness, SOB, chest pain, palpitations, abdominal pain or changes in bowel or bladder habits.  No falls or syncope reported.  Appetite and hydration are good. Weight is 190 lbs.   ECOG Performance Status: 1 - Symptomatic but completely ambulatory  Medications:  Allergies as of 02/22/2024   No Known Allergies      Medication List        Accurate as of February 22, 2024  7:41 PM. If you have any questions, ask your nurse or doctor.          amiodarone  200 MG tablet Commonly known as: PACERONE  Take 1 tablet (200 mg total) by mouth 2 (two) times daily.   diltiazem  120 MG 24 hr capsule Commonly known as: DILACOR XR  Take 1 capsule (120 mg total) by mouth daily.   ENSURE PLUS PO Take 1 Bottle by mouth 3 (three) times daily with meals.   fluticasone  50 MCG/ACT nasal spray Commonly known as: FLONASE  Place 1 spray into both nostrils daily as needed (congestion).   folic acid  1 MG tablet Commonly known as: FOLVITE  Take 2 tablets (2 mg total) by mouth daily.   hydrocortisone  2.5 % cream Apply 1 Application topically daily as needed (itching).   latanoprost  0.005 % ophthalmic solution Commonly known as: XALATAN  Place 1 drop into both eyes in the morning and at bedtime.   methocarbamol  500 MG  tablet Commonly known as: ROBAXIN  Take 1 tablet (500 mg total) by mouth every 6 (six) hours as needed for muscle spasms.   multivitamin with minerals Tabs tablet Take 1 tablet by mouth daily.   NON FORMULARY Pt uses a cpap nightly   oxyCODONE  5 MG immediate release tablet Commonly known as: Oxy IR/ROXICODONE  Take 1-2 tablets (5-10 mg total) by mouth every 6 (six) hours as needed for moderate pain (pain score 4-6) (pain score 4-6). No more than 6 tablets daily.   pantoprazole  40 MG tablet Commonly known as: PROTONIX  Take 40 mg by mouth 2 (two) times daily.   rosuvastatin  20 MG tablet Commonly known as: CRESTOR  Take 20 mg by mouth daily.   tamsulosin  0.4 MG Caps capsule Commonly known as: FLOMAX  Take 0.4 mg by mouth at bedtime.        Allergies: Allergies[1]  Past Medical History, Surgical history, Social history, and Family History were reviewed and updated.  Review of Systems: All other 10 point review of systems is negative.   Physical Exam:  height is 5' 9 (1.753 m) and weight is 190 lb (86.2 kg). His oral temperature is 98.1 F (36.7 C). His blood pressure is 142/84 (abnormal) and his pulse is 81. His respiration is 17 and oxygen  saturation is 100%.   Wt Readings from Last 3 Encounters:  02/22/24 190 lb (  86.2 kg)  02/04/24 181 lb 10.5 oz (82.4 kg)  01/25/24 181 lb (82.1 kg)    Ocular: Sclerae unicteric, pupils equal, round and reactive to light Ear-nose-throat: Oropharynx clear, dentition fair Lymphatic: No cervical or supraclavicular adenopathy Lungs no rales or rhonchi, good excursion bilaterally Heart regular rate and rhythm, no murmur appreciated Abd soft, nontender, positive bowel sounds MSK no focal spinal tenderness, no joint edema Neuro: non-focal, well-oriented, appropriate affect Breasts: Deferred   Lab Results  Component Value Date   WBC 7.0 02/22/2024   HGB 11.1 (L) 02/22/2024   HCT 34.1 (L) 02/22/2024   MCV 97.2 02/22/2024   PLT 114 (L)  02/22/2024   Lab Results  Component Value Date   FERRITIN 805 (H) 02/22/2024   IRON  81 02/22/2024   TIBC 269 02/22/2024   UIBC 188 02/22/2024   IRONPCTSAT 30 02/22/2024   Lab Results  Component Value Date   RETICCTPCT 4.2 (H) 10/12/2022   RBC 3.51 (L) 02/22/2024   No results found for: KPAFRELGTCHN, LAMBDASER, KAPLAMBRATIO No results found for: IGGSERUM, IGA, IGMSERUM No results found for: STEPHANY CARLOTA BENSON MARKEL EARLA JOANNIE DOC VICK, SPEI   Chemistry      Component Value Date/Time   NA 138 02/22/2024 1421   NA 140 05/11/2019 0946   K 4.4 02/22/2024 1421   CL 102 02/22/2024 1421   CO2 24 02/22/2024 1421   BUN 17 02/22/2024 1421   BUN 17 05/11/2019 0946   CREATININE 1.40 (H) 02/22/2024 1421   CREATININE 0.75 04/10/2016 1132      Component Value Date/Time   CALCIUM  9.4 02/22/2024 1421   ALKPHOS 64 02/22/2024 1421   AST 34 02/22/2024 1421   ALT 20 02/22/2024 1421   BILITOT 1.3 (H) 02/22/2024 1421       Impression and Plan: Lonnie Hansen is a very pleasant 74 yo African American gentleman with chronic ITP.   Nplate  given for platelet count of 114.  Follow-up in 1 months.   Lauraine Pepper, NP 1/27/20267:41 PM     [1] No Known Allergies  "

## 2024-02-22 NOTE — Patient Instructions (Signed)
 Romiplostim Injection What is this medication? ROMIPLOSTIM (roe mi PLOE stim) treats low levels of platelets in your body caused by immune thrombocytopenia (ITP). It is prescribed when other medications have not worked or cannot be tolerated. It may also be used to help people who have been exposed to high doses of radiation. It works by increasing the amount of platelets in your blood. This lowers the risk of bleeding. This medicine may be used for other purposes; ask your health care provider or pharmacist if you have questions. COMMON BRAND NAME(S): Nplate What should I tell my care team before I take this medication? They need to know if you have any of these conditions: Blood clots Myelodysplastic syndrome An unusual or allergic reaction to romiplostim, mannitol, other medications, foods, dyes, or preservatives Pregnant or trying to get pregnant Breast-feeding How should I use this medication? This medication is injected under the skin. It is given by a care team in a hospital or clinic setting. A special MedGuide will be given to you before each treatment. Be sure to read this information carefully each time. Talk to your care team about the use of this medication in children. While it may be prescribed for children as young as newborns for selected conditions, precautions do apply. Overdosage: If you think you have taken too much of this medicine contact a poison control center or emergency room at once. NOTE: This medicine is only for you. Do not share this medicine with others. What if I miss a dose? Keep appointments for follow-up doses. It is important not to miss your dose. Call your care team if you are unable to keep an appointment. What may interact with this medication? Interactions are not expected. This list may not describe all possible interactions. Give your health care provider a list of all the medicines, herbs, non-prescription drugs, or dietary supplements you use. Also  tell them if you smoke, drink alcohol, or use illegal drugs. Some items may interact with your medicine. What should I watch for while using this medication? Visit your care team for regular checks on your progress. You may need blood work done while you are taking this medication. Your condition will be monitored carefully while you are receiving this medication. It is important not to miss any appointments. What side effects may I notice from receiving this medication? Side effects that you should report to your care team as soon as possible: Allergic reactions--skin rash, itching, hives, swelling of the face, lips, tongue, or throat Blood clot--pain, swelling, or warmth in the leg, shortness of breath, chest pain Side effects that usually do not require medical attention (report to your care team if they continue or are bothersome): Dizziness Joint pain Muscle pain Pain in the hands or feet Stomach pain Trouble sleeping This list may not describe all possible side effects. Call your doctor for medical advice about side effects. You may report side effects to FDA at 1-800-FDA-1088. Where should I keep my medication? This medication is given in a hospital or clinic. It will not be stored at home. NOTE: This sheet is a summary. It may not cover all possible information. If you have questions about this medicine, talk to your doctor, pharmacist, or health care provider.  2024 Elsevier/Gold Standard (2021-05-19 00:00:00)

## 2024-03-16 ENCOUNTER — Encounter: Admitting: Orthopaedic Surgery

## 2024-03-17 ENCOUNTER — Ambulatory Visit: Admitting: Cardiology

## 2024-03-27 ENCOUNTER — Inpatient Hospital Stay: Attending: Hematology & Oncology

## 2024-03-27 ENCOUNTER — Inpatient Hospital Stay

## 2024-03-27 ENCOUNTER — Inpatient Hospital Stay: Admitting: Family
